# Patient Record
Sex: Female | Born: 1935
Health system: Southern US, Community
[De-identification: ages and names within clinical notes are randomized; demographics above are authoritative.]

## PROBLEM LIST (undated history)

## (undated) DIAGNOSIS — F419 Anxiety disorder, unspecified: Secondary | ICD-10-CM

## (undated) DIAGNOSIS — I480 Paroxysmal atrial fibrillation: Secondary | ICD-10-CM

## (undated) DIAGNOSIS — G629 Polyneuropathy, unspecified: Secondary | ICD-10-CM

## (undated) DIAGNOSIS — G4733 Obstructive sleep apnea (adult) (pediatric): Secondary | ICD-10-CM

## (undated) DIAGNOSIS — R7881 Bacteremia: Secondary | ICD-10-CM

## (undated) DIAGNOSIS — M719 Bursopathy, unspecified: Secondary | ICD-10-CM

## (undated) DIAGNOSIS — B9620 Unspecified Escherichia coli [E. coli] as the cause of diseases classified elsewhere: Secondary | ICD-10-CM

## (undated) DIAGNOSIS — F329 Major depressive disorder, single episode, unspecified: Secondary | ICD-10-CM

## (undated) DIAGNOSIS — K439 Ventral hernia without obstruction or gangrene: Secondary | ICD-10-CM

## (undated) DIAGNOSIS — R911 Solitary pulmonary nodule: Secondary | ICD-10-CM

## (undated) DIAGNOSIS — D509 Iron deficiency anemia, unspecified: Secondary | ICD-10-CM

## (undated) DIAGNOSIS — K219 Gastro-esophageal reflux disease without esophagitis: Secondary | ICD-10-CM

## (undated) DIAGNOSIS — E785 Hyperlipidemia, unspecified: Secondary | ICD-10-CM

## (undated) DIAGNOSIS — Z87440 Personal history of urinary (tract) infections: Secondary | ICD-10-CM

## (undated) DIAGNOSIS — E669 Obesity, unspecified: Secondary | ICD-10-CM

## (undated) DIAGNOSIS — E039 Hypothyroidism, unspecified: Secondary | ICD-10-CM

## (undated) DIAGNOSIS — I10 Essential (primary) hypertension: Secondary | ICD-10-CM

## (undated) DIAGNOSIS — E119 Type 2 diabetes mellitus without complications: Secondary | ICD-10-CM

## (undated) DIAGNOSIS — I5032 Chronic diastolic (congestive) heart failure: Secondary | ICD-10-CM

## (undated) DIAGNOSIS — I2510 Atherosclerotic heart disease of native coronary artery without angina pectoris: Secondary | ICD-10-CM

## (undated) DIAGNOSIS — H269 Unspecified cataract: Secondary | ICD-10-CM

## (undated) DIAGNOSIS — J449 Chronic obstructive pulmonary disease, unspecified: Secondary | ICD-10-CM

## (undated) DIAGNOSIS — T7840XA Allergy, unspecified, initial encounter: Secondary | ICD-10-CM

## (undated) DIAGNOSIS — M67919 Unspecified disorder of synovium and tendon, unspecified shoulder: Secondary | ICD-10-CM

## (undated) HISTORY — PX: CT ABD W & PELVIS WO/W CM: HXRAD299

## (undated) HISTORY — DX: Unspecified Escherichia coli (E. coli) as the cause of diseases classified elsewhere: B96.20

## (undated) HISTORY — DX: Essential (primary) hypertension: I10

## (undated) HISTORY — DX: Bursopathy, unspecified: M71.9

## (undated) HISTORY — DX: Solitary pulmonary nodule: R91.1

## (undated) HISTORY — DX: Bacteremia: R78.81

## (undated) HISTORY — DX: Personal history of urinary (tract) infections: Z87.440

## (undated) HISTORY — DX: Unspecified cataract: H26.9

## (undated) HISTORY — DX: Iron deficiency anemia, unspecified: D50.9

## (undated) HISTORY — DX: Obstructive sleep apnea (adult) (pediatric): G47.33

## (undated) HISTORY — DX: Polyneuropathy, unspecified: G62.9

## (undated) HISTORY — DX: Anxiety disorder, unspecified: F41.9

## (undated) HISTORY — DX: Hypothyroidism, unspecified: E03.9

## (undated) HISTORY — DX: Type 2 diabetes mellitus without complications: E11.9

## (undated) HISTORY — DX: Paroxysmal atrial fibrillation: I48.0

## (undated) HISTORY — DX: Allergy, unspecified, initial encounter: T78.40XA

## (undated) HISTORY — DX: Ventral hernia without obstruction or gangrene: K43.9

## (undated) HISTORY — PX: TONSILLECTOMY: SUR1361

## (undated) HISTORY — DX: Hyperlipidemia, unspecified: E78.5

## (undated) HISTORY — DX: Obesity, unspecified: E66.9

## (undated) HISTORY — DX: Unspecified disorder of synovium and tendon, unspecified shoulder: M67.919

## (undated) HISTORY — DX: Major depressive disorder, single episode, unspecified: F32.9

## (undated) HISTORY — DX: Chronic obstructive pulmonary disease, unspecified: J44.9

## (undated) HISTORY — PX: OTHER SURGICAL HISTORY: SHX169

## (undated) HISTORY — PX: GALLBLADDER SURGERY: SHX652

---

## 1968-03-08 DIAGNOSIS — E039 Hypothyroidism, unspecified: Secondary | ICD-10-CM

## 1968-03-08 HISTORY — DX: Hypothyroidism, unspecified: E03.9

## 1982-03-08 DIAGNOSIS — M719 Bursopathy, unspecified: Secondary | ICD-10-CM

## 1982-03-08 DIAGNOSIS — M67919 Unspecified disorder of synovium and tendon, unspecified shoulder: Secondary | ICD-10-CM | POA: Insufficient documentation

## 1982-03-08 HISTORY — PX: ROTATOR CUFF REPAIR: SHX139

## 1983-03-09 HISTORY — PX: TOTAL ABDOMINAL HYSTERECTOMY: SHX209

## 1992-03-08 DIAGNOSIS — I10 Essential (primary) hypertension: Secondary | ICD-10-CM

## 1992-03-08 HISTORY — DX: Essential (primary) hypertension: I10

## 1995-03-09 HISTORY — PX: CHOLECYSTECTOMY: SHX55

## 1997-03-08 DIAGNOSIS — F339 Major depressive disorder, recurrent, unspecified: Secondary | ICD-10-CM | POA: Insufficient documentation

## 1997-10-06 ENCOUNTER — Encounter: Payer: Self-pay | Admitting: Family Medicine

## 1997-10-06 LAB — CONVERTED CEMR LAB: Pap Smear: NORMAL

## 1997-12-05 HISTORY — PX: ESOPHAGOGASTRODUODENOSCOPY: SHX1529

## 1997-12-06 HISTORY — PX: OTHER SURGICAL HISTORY: SHX169

## 1997-12-06 HISTORY — PX: CARPAL TUNNEL RELEASE: SHX101

## 1998-03-08 DIAGNOSIS — J449 Chronic obstructive pulmonary disease, unspecified: Secondary | ICD-10-CM

## 1998-03-08 HISTORY — DX: Chronic obstructive pulmonary disease, unspecified: J44.9

## 1998-12-07 DIAGNOSIS — F32A Depression, unspecified: Secondary | ICD-10-CM

## 1998-12-07 DIAGNOSIS — F329 Major depressive disorder, single episode, unspecified: Secondary | ICD-10-CM

## 1998-12-07 HISTORY — DX: Depression, unspecified: F32.A

## 1998-12-07 HISTORY — DX: Major depressive disorder, single episode, unspecified: F32.9

## 1999-01-14 ENCOUNTER — Other Ambulatory Visit: Admission: RE | Admit: 1999-01-14 | Discharge: 1999-01-14 | Payer: Self-pay | Admitting: Obstetrics & Gynecology

## 1999-03-19 ENCOUNTER — Encounter: Payer: Self-pay | Admitting: Family Medicine

## 1999-03-19 ENCOUNTER — Encounter: Admission: RE | Admit: 1999-03-19 | Discharge: 1999-03-19 | Payer: Self-pay | Admitting: Family Medicine

## 1999-03-19 HISTORY — PX: OTHER SURGICAL HISTORY: SHX169

## 1999-03-24 ENCOUNTER — Encounter: Payer: Self-pay | Admitting: Family Medicine

## 1999-03-24 ENCOUNTER — Encounter: Admission: RE | Admit: 1999-03-24 | Discharge: 1999-03-24 | Payer: Self-pay | Admitting: Family Medicine

## 2000-01-07 ENCOUNTER — Encounter: Payer: Self-pay | Admitting: Family Medicine

## 2000-01-07 LAB — CONVERTED CEMR LAB: Pap Smear: NORMAL

## 2000-02-10 ENCOUNTER — Other Ambulatory Visit: Admission: RE | Admit: 2000-02-10 | Discharge: 2000-02-10 | Payer: Self-pay | Admitting: Obstetrics and Gynecology

## 2000-07-20 ENCOUNTER — Encounter: Payer: Self-pay | Admitting: Orthopedic Surgery

## 2000-07-27 ENCOUNTER — Ambulatory Visit (HOSPITAL_COMMUNITY): Admission: RE | Admit: 2000-07-27 | Discharge: 2000-07-27 | Payer: Self-pay | Admitting: Orthopedic Surgery

## 2000-07-27 ENCOUNTER — Encounter (INDEPENDENT_AMBULATORY_CARE_PROVIDER_SITE_OTHER): Payer: Self-pay | Admitting: Specialist

## 2000-08-24 ENCOUNTER — Encounter: Payer: Self-pay | Admitting: Cardiovascular Disease

## 2000-08-24 HISTORY — PX: OTHER SURGICAL HISTORY: SHX169

## 2000-10-04 DIAGNOSIS — E785 Hyperlipidemia, unspecified: Secondary | ICD-10-CM | POA: Insufficient documentation

## 2000-10-04 HISTORY — DX: Hyperlipidemia, unspecified: E78.5

## 2001-05-18 ENCOUNTER — Other Ambulatory Visit: Admission: RE | Admit: 2001-05-18 | Discharge: 2001-05-18 | Payer: Self-pay | Admitting: Obstetrics and Gynecology

## 2001-10-06 DIAGNOSIS — R209 Unspecified disturbances of skin sensation: Secondary | ICD-10-CM | POA: Insufficient documentation

## 2002-05-07 ENCOUNTER — Encounter: Payer: Self-pay | Admitting: Family Medicine

## 2002-05-07 LAB — CONVERTED CEMR LAB: Pap Smear: NORMAL

## 2002-05-22 ENCOUNTER — Other Ambulatory Visit: Admission: RE | Admit: 2002-05-22 | Discharge: 2002-05-22 | Payer: Self-pay | Admitting: Obstetrics and Gynecology

## 2002-06-14 ENCOUNTER — Emergency Department (HOSPITAL_COMMUNITY): Admission: AD | Admit: 2002-06-14 | Discharge: 2002-06-14 | Payer: Self-pay | Admitting: Emergency Medicine

## 2003-07-03 HISTORY — PX: OTHER SURGICAL HISTORY: SHX169

## 2003-09-20 ENCOUNTER — Encounter: Admission: RE | Admit: 2003-09-20 | Discharge: 2003-09-20 | Payer: Self-pay | Admitting: Orthopedic Surgery

## 2003-09-26 ENCOUNTER — Ambulatory Visit (HOSPITAL_BASED_OUTPATIENT_CLINIC_OR_DEPARTMENT_OTHER): Admission: RE | Admit: 2003-09-26 | Discharge: 2003-09-26 | Payer: Self-pay | Admitting: Orthopedic Surgery

## 2003-09-26 ENCOUNTER — Ambulatory Visit (HOSPITAL_COMMUNITY): Admission: RE | Admit: 2003-09-26 | Discharge: 2003-09-26 | Payer: Self-pay | Admitting: Orthopedic Surgery

## 2004-01-07 ENCOUNTER — Ambulatory Visit: Payer: Self-pay | Admitting: Family Medicine

## 2004-03-03 ENCOUNTER — Ambulatory Visit: Payer: Self-pay | Admitting: Family Medicine

## 2004-06-09 ENCOUNTER — Ambulatory Visit: Payer: Self-pay | Admitting: Family Medicine

## 2004-06-11 ENCOUNTER — Ambulatory Visit: Payer: Self-pay | Admitting: Family Medicine

## 2004-08-05 ENCOUNTER — Ambulatory Visit: Payer: Self-pay | Admitting: Internal Medicine

## 2004-12-03 ENCOUNTER — Ambulatory Visit: Payer: Self-pay | Admitting: Family Medicine

## 2004-12-28 ENCOUNTER — Encounter: Admission: RE | Admit: 2004-12-28 | Discharge: 2004-12-28 | Payer: Self-pay | Admitting: Surgery

## 2005-02-11 ENCOUNTER — Ambulatory Visit: Payer: Self-pay | Admitting: Family Medicine

## 2005-12-03 ENCOUNTER — Ambulatory Visit: Payer: Self-pay | Admitting: Family Medicine

## 2005-12-07 ENCOUNTER — Ambulatory Visit: Payer: Self-pay | Admitting: Family Medicine

## 2005-12-22 ENCOUNTER — Ambulatory Visit: Payer: Self-pay | Admitting: Family Medicine

## 2006-01-08 ENCOUNTER — Emergency Department (HOSPITAL_COMMUNITY): Admission: EM | Admit: 2006-01-08 | Discharge: 2006-01-08 | Payer: Self-pay | Admitting: Family Medicine

## 2006-01-19 ENCOUNTER — Ambulatory Visit: Payer: Self-pay | Admitting: Family Medicine

## 2006-02-05 DIAGNOSIS — E119 Type 2 diabetes mellitus without complications: Secondary | ICD-10-CM

## 2006-02-05 HISTORY — DX: Type 2 diabetes mellitus without complications: E11.9

## 2006-02-15 ENCOUNTER — Ambulatory Visit: Payer: Self-pay | Admitting: Family Medicine

## 2006-03-16 ENCOUNTER — Ambulatory Visit: Payer: Self-pay | Admitting: Family Medicine

## 2006-05-25 ENCOUNTER — Ambulatory Visit: Payer: Self-pay | Admitting: Family Medicine

## 2006-06-05 ENCOUNTER — Encounter: Payer: Self-pay | Admitting: Family Medicine

## 2006-06-14 ENCOUNTER — Ambulatory Visit: Payer: Self-pay | Admitting: Family Medicine

## 2006-06-14 LAB — CONVERTED CEMR LAB: Hgb A1c MFr Bld: 5.8 % (ref 4.6–6.0)

## 2006-06-16 ENCOUNTER — Ambulatory Visit: Payer: Self-pay | Admitting: Family Medicine

## 2006-07-12 ENCOUNTER — Ambulatory Visit: Payer: Self-pay | Admitting: Family Medicine

## 2006-07-25 DIAGNOSIS — T7840XA Allergy, unspecified, initial encounter: Secondary | ICD-10-CM | POA: Insufficient documentation

## 2006-08-25 ENCOUNTER — Telehealth: Payer: Self-pay | Admitting: Family Medicine

## 2006-10-05 ENCOUNTER — Emergency Department (HOSPITAL_COMMUNITY): Admission: EM | Admit: 2006-10-05 | Discharge: 2006-10-05 | Payer: Self-pay | Admitting: Family Medicine

## 2007-01-04 ENCOUNTER — Encounter: Admission: RE | Admit: 2007-01-04 | Discharge: 2007-01-04 | Payer: Self-pay | Admitting: Orthopedic Surgery

## 2007-01-12 ENCOUNTER — Ambulatory Visit: Payer: Self-pay | Admitting: Family Medicine

## 2007-01-12 LAB — CONVERTED CEMR LAB
ALT: 18 units/L (ref 0–35)
AST: 14 units/L (ref 0–37)
BUN: 22 mg/dL (ref 6–23)
Basophils Absolute: 0.1 10*3/uL (ref 0.0–0.1)
Basophils Relative: 0.9 % (ref 0.0–1.0)
CO2: 31 meq/L (ref 19–32)
Calcium: 9.3 mg/dL (ref 8.4–10.5)
Chloride: 105 meq/L (ref 96–112)
Cholesterol: 234 mg/dL (ref 0–200)
Creatinine, Ser: 1 mg/dL (ref 0.4–1.2)
Creatinine,U: 176.4 mg/dL
Direct LDL: 174.7 mg/dL
Eosinophils Absolute: 0.2 10*3/uL (ref 0.0–0.6)
Eosinophils Relative: 2.2 % (ref 0.0–5.0)
Free T4: 0.8 ng/dL (ref 0.6–1.6)
GFR calc Af Amer: 71 mL/min
GFR calc non Af Amer: 58 mL/min
Glucose, Bld: 113 mg/dL — ABNORMAL HIGH (ref 70–99)
HCT: 40.6 % (ref 36.0–46.0)
HDL: 50.3 mg/dL (ref >39.0)
Hemoglobin: 14.2 g/dL (ref 12.0–15.0)
Hgb A1c MFr Bld: 6 % (ref 4.6–6.0)
Lymphocytes Relative: 29.2 % (ref 12.0–46.0)
MCHC: 35 g/dL (ref 30.0–36.0)
MCV: 88.5 fL (ref 78.0–100.0)
Microalb Creat Ratio: 10.2 mg/g (ref 0.0–30.0)
Microalb, Ur: 1.8 mg/dL (ref 0.0–1.9)
Monocytes Absolute: 0.7 10*3/uL (ref 0.2–0.7)
Monocytes Relative: 9.3 % (ref 3.0–11.0)
Neutro Abs: 4.1 10*3/uL (ref 1.4–7.7)
Neutrophils Relative %: 58.4 % (ref 43.0–77.0)
Platelets: 308 10*3/uL (ref 150–400)
Potassium: 4.7 meq/L (ref 3.5–5.1)
RBC: 4.59 M/uL (ref 3.87–5.11)
RDW: 13.5 % (ref 11.5–14.6)
Sodium: 143 meq/L (ref 135–145)
TSH: 5.56 microintl units/mL — ABNORMAL HIGH (ref 0.35–5.50)
Total CHOL/HDL Ratio: 4.7
Triglycerides: 126 mg/dL (ref 0–149)
VLDL: 25 mg/dL (ref 0–40)
WBC: 7.2 10*3/uL (ref 4.5–10.5)

## 2007-01-18 ENCOUNTER — Ambulatory Visit: Payer: Self-pay | Admitting: Family Medicine

## 2007-01-23 ENCOUNTER — Ambulatory Visit (HOSPITAL_BASED_OUTPATIENT_CLINIC_OR_DEPARTMENT_OTHER): Admission: RE | Admit: 2007-01-23 | Discharge: 2007-01-23 | Payer: Self-pay | Admitting: Orthopedic Surgery

## 2007-04-19 ENCOUNTER — Telehealth: Payer: Self-pay | Admitting: Family Medicine

## 2007-05-11 ENCOUNTER — Ambulatory Visit: Payer: Self-pay | Admitting: Family Medicine

## 2007-05-18 ENCOUNTER — Telehealth (INDEPENDENT_AMBULATORY_CARE_PROVIDER_SITE_OTHER): Payer: Self-pay | Admitting: *Deleted

## 2007-05-19 ENCOUNTER — Telehealth (INDEPENDENT_AMBULATORY_CARE_PROVIDER_SITE_OTHER): Payer: Self-pay | Admitting: *Deleted

## 2007-05-23 ENCOUNTER — Ambulatory Visit: Payer: Self-pay | Admitting: Family Medicine

## 2007-05-23 DIAGNOSIS — J439 Emphysema, unspecified: Secondary | ICD-10-CM | POA: Insufficient documentation

## 2007-05-23 DIAGNOSIS — J449 Chronic obstructive pulmonary disease, unspecified: Secondary | ICD-10-CM | POA: Insufficient documentation

## 2007-05-26 ENCOUNTER — Ambulatory Visit: Payer: Self-pay | Admitting: Cardiology

## 2007-06-02 ENCOUNTER — Ambulatory Visit: Payer: Self-pay

## 2007-06-02 ENCOUNTER — Encounter: Payer: Self-pay | Admitting: Family Medicine

## 2007-06-02 HISTORY — PX: OTHER SURGICAL HISTORY: SHX169

## 2007-06-02 HISTORY — PX: US ECHOCARDIOGRAPHY: HXRAD669

## 2007-07-03 ENCOUNTER — Ambulatory Visit: Payer: Self-pay | Admitting: Family Medicine

## 2007-07-03 DIAGNOSIS — R609 Edema, unspecified: Secondary | ICD-10-CM | POA: Insufficient documentation

## 2007-08-09 ENCOUNTER — Telehealth: Payer: Self-pay | Admitting: Family Medicine

## 2007-10-11 ENCOUNTER — Ambulatory Visit: Payer: Self-pay | Admitting: Family Medicine

## 2007-10-11 DIAGNOSIS — K439 Ventral hernia without obstruction or gangrene: Secondary | ICD-10-CM | POA: Insufficient documentation

## 2007-10-11 DIAGNOSIS — K209 Esophagitis, unspecified without bleeding: Secondary | ICD-10-CM | POA: Insufficient documentation

## 2007-10-12 ENCOUNTER — Encounter: Payer: Self-pay | Admitting: Family Medicine

## 2007-10-12 LAB — CONVERTED CEMR LAB: TSH: 2.9 microintl units/mL (ref 0.35–5.50)

## 2007-10-13 ENCOUNTER — Encounter: Admission: RE | Admit: 2007-10-13 | Discharge: 2007-10-13 | Payer: Self-pay | Admitting: Family Medicine

## 2007-10-16 ENCOUNTER — Telehealth (INDEPENDENT_AMBULATORY_CARE_PROVIDER_SITE_OTHER): Payer: Self-pay | Admitting: *Deleted

## 2007-11-08 ENCOUNTER — Ambulatory Visit: Payer: Self-pay | Admitting: Gastroenterology

## 2007-11-08 DIAGNOSIS — R1319 Other dysphagia: Secondary | ICD-10-CM | POA: Insufficient documentation

## 2007-11-08 DIAGNOSIS — T17308A Unspecified foreign body in larynx causing other injury, initial encounter: Secondary | ICD-10-CM | POA: Insufficient documentation

## 2007-11-10 ENCOUNTER — Ambulatory Visit (HOSPITAL_COMMUNITY): Admission: RE | Admit: 2007-11-10 | Discharge: 2007-11-10 | Payer: Self-pay | Admitting: Gastroenterology

## 2007-11-14 ENCOUNTER — Telehealth (INDEPENDENT_AMBULATORY_CARE_PROVIDER_SITE_OTHER): Payer: Self-pay | Admitting: *Deleted

## 2007-11-16 ENCOUNTER — Ambulatory Visit (HOSPITAL_COMMUNITY): Admission: RE | Admit: 2007-11-16 | Discharge: 2007-11-16 | Payer: Self-pay | Admitting: Gastroenterology

## 2007-11-22 ENCOUNTER — Encounter: Payer: Self-pay | Admitting: Gastroenterology

## 2007-11-22 DIAGNOSIS — K222 Esophageal obstruction: Secondary | ICD-10-CM | POA: Insufficient documentation

## 2007-11-28 ENCOUNTER — Ambulatory Visit: Payer: Self-pay | Admitting: Gastroenterology

## 2007-11-29 ENCOUNTER — Encounter: Payer: Self-pay | Admitting: Gastroenterology

## 2007-11-29 LAB — CONVERTED CEMR LAB
BUN: 18 mg/dL (ref 6–23)
CO2: 32 meq/L (ref 19–32)
Calcium: 8.9 mg/dL (ref 8.4–10.5)
Chloride: 105 meq/L (ref 96–112)
Creatinine, Ser: 1.2 mg/dL (ref 0.4–1.2)
GFR calc Af Amer: 57 mL/min
GFR calc non Af Amer: 47 mL/min
Glucose, Bld: 119 mg/dL — ABNORMAL HIGH (ref 70–99)
Potassium: 4.6 meq/L (ref 3.5–5.1)
Sodium: 141 meq/L (ref 135–145)

## 2007-11-30 ENCOUNTER — Ambulatory Visit: Payer: Self-pay | Admitting: Cardiology

## 2007-12-06 ENCOUNTER — Ambulatory Visit: Payer: Self-pay | Admitting: Gastroenterology

## 2007-12-14 ENCOUNTER — Ambulatory Visit: Payer: Self-pay | Admitting: Gastroenterology

## 2007-12-14 ENCOUNTER — Ambulatory Visit (HOSPITAL_COMMUNITY): Admission: RE | Admit: 2007-12-14 | Discharge: 2007-12-14 | Payer: Self-pay | Admitting: Gastroenterology

## 2007-12-15 ENCOUNTER — Ambulatory Visit: Payer: Self-pay | Admitting: Family Medicine

## 2008-01-04 ENCOUNTER — Ambulatory Visit: Payer: Self-pay | Admitting: Family Medicine

## 2008-01-08 ENCOUNTER — Ambulatory Visit: Payer: Self-pay | Admitting: Gastroenterology

## 2008-01-08 DIAGNOSIS — R498 Other voice and resonance disorders: Secondary | ICD-10-CM | POA: Insufficient documentation

## 2008-01-11 ENCOUNTER — Telehealth: Payer: Self-pay | Admitting: Family Medicine

## 2008-01-15 ENCOUNTER — Telehealth: Payer: Self-pay | Admitting: Family Medicine

## 2008-01-18 ENCOUNTER — Ambulatory Visit: Payer: Self-pay | Admitting: Family Medicine

## 2008-01-23 ENCOUNTER — Encounter: Payer: Self-pay | Admitting: Gastroenterology

## 2008-03-20 ENCOUNTER — Telehealth: Payer: Self-pay | Admitting: Family Medicine

## 2008-03-21 ENCOUNTER — Telehealth: Payer: Self-pay | Admitting: Family Medicine

## 2008-04-17 ENCOUNTER — Encounter: Payer: Self-pay | Admitting: Family Medicine

## 2008-04-24 ENCOUNTER — Ambulatory Visit: Payer: Self-pay | Admitting: Family Medicine

## 2008-04-30 ENCOUNTER — Encounter: Payer: Self-pay | Admitting: Family Medicine

## 2008-05-02 ENCOUNTER — Encounter: Payer: Self-pay | Admitting: Family Medicine

## 2008-05-09 ENCOUNTER — Telehealth: Payer: Self-pay | Admitting: Family Medicine

## 2008-05-30 ENCOUNTER — Ambulatory Visit: Payer: Self-pay | Admitting: Family Medicine

## 2008-06-04 ENCOUNTER — Telehealth: Payer: Self-pay | Admitting: Family Medicine

## 2008-06-12 ENCOUNTER — Encounter: Payer: Self-pay | Admitting: Family Medicine

## 2008-07-19 ENCOUNTER — Encounter: Payer: Self-pay | Admitting: Family Medicine

## 2008-08-29 ENCOUNTER — Ambulatory Visit: Payer: Self-pay | Admitting: Family Medicine

## 2008-08-29 DIAGNOSIS — R002 Palpitations: Secondary | ICD-10-CM | POA: Insufficient documentation

## 2008-09-02 ENCOUNTER — Telehealth: Payer: Self-pay | Admitting: Family Medicine

## 2008-10-17 ENCOUNTER — Emergency Department (HOSPITAL_COMMUNITY): Admission: EM | Admit: 2008-10-17 | Discharge: 2008-10-17 | Payer: Self-pay | Admitting: Family Medicine

## 2008-10-23 ENCOUNTER — Encounter: Payer: Self-pay | Admitting: Family Medicine

## 2008-10-24 ENCOUNTER — Ambulatory Visit: Payer: Self-pay | Admitting: Family Medicine

## 2008-10-31 ENCOUNTER — Encounter (INDEPENDENT_AMBULATORY_CARE_PROVIDER_SITE_OTHER): Payer: Self-pay | Admitting: *Deleted

## 2008-11-12 ENCOUNTER — Telehealth: Payer: Self-pay | Admitting: Family Medicine

## 2008-11-13 ENCOUNTER — Encounter: Payer: Self-pay | Admitting: Family Medicine

## 2008-12-12 ENCOUNTER — Encounter: Payer: Self-pay | Admitting: Family Medicine

## 2009-01-03 ENCOUNTER — Ambulatory Visit: Payer: Self-pay | Admitting: Family Medicine

## 2009-01-08 ENCOUNTER — Ambulatory Visit: Payer: Self-pay | Admitting: Family Medicine

## 2009-01-09 ENCOUNTER — Telehealth: Payer: Self-pay | Admitting: Family Medicine

## 2009-01-24 ENCOUNTER — Inpatient Hospital Stay (HOSPITAL_COMMUNITY): Admission: RE | Admit: 2009-01-24 | Discharge: 2009-02-12 | Payer: Self-pay | Admitting: General Surgery

## 2009-01-24 HISTORY — PX: HERNIA REPAIR: SHX51

## 2009-02-20 ENCOUNTER — Encounter: Payer: Self-pay | Admitting: Internal Medicine

## 2009-03-17 ENCOUNTER — Telehealth: Payer: Self-pay | Admitting: Family Medicine

## 2009-03-27 ENCOUNTER — Encounter: Payer: Self-pay | Admitting: Family Medicine

## 2009-04-22 ENCOUNTER — Telehealth: Payer: Self-pay | Admitting: Family Medicine

## 2009-05-22 ENCOUNTER — Telehealth: Payer: Self-pay | Admitting: Family Medicine

## 2009-06-30 ENCOUNTER — Ambulatory Visit: Payer: Self-pay | Admitting: Family Medicine

## 2009-06-30 DIAGNOSIS — M79609 Pain in unspecified limb: Secondary | ICD-10-CM | POA: Insufficient documentation

## 2009-06-30 DIAGNOSIS — B351 Tinea unguium: Secondary | ICD-10-CM | POA: Insufficient documentation

## 2009-08-25 ENCOUNTER — Encounter: Payer: Self-pay | Admitting: Family Medicine

## 2009-09-11 ENCOUNTER — Ambulatory Visit: Payer: Self-pay | Admitting: Family Medicine

## 2009-09-16 ENCOUNTER — Ambulatory Visit: Payer: Self-pay | Admitting: Internal Medicine

## 2009-09-17 ENCOUNTER — Telehealth: Payer: Self-pay | Admitting: Internal Medicine

## 2009-10-09 ENCOUNTER — Encounter (INDEPENDENT_AMBULATORY_CARE_PROVIDER_SITE_OTHER): Payer: Self-pay | Admitting: *Deleted

## 2009-11-03 ENCOUNTER — Telehealth: Payer: Self-pay | Admitting: Family Medicine

## 2009-11-05 ENCOUNTER — Telehealth: Payer: Self-pay | Admitting: Family Medicine

## 2009-11-20 ENCOUNTER — Ambulatory Visit: Payer: Self-pay | Admitting: Internal Medicine

## 2009-11-25 ENCOUNTER — Encounter: Payer: Self-pay | Admitting: Family Medicine

## 2009-12-05 ENCOUNTER — Encounter: Payer: Self-pay | Admitting: Family Medicine

## 2009-12-24 ENCOUNTER — Telehealth: Payer: Self-pay | Admitting: Family Medicine

## 2010-01-13 ENCOUNTER — Encounter: Payer: Self-pay | Admitting: Cardiovascular Disease

## 2010-01-13 ENCOUNTER — Encounter: Payer: Self-pay | Admitting: Internal Medicine

## 2010-01-13 ENCOUNTER — Ambulatory Visit: Payer: Self-pay | Admitting: Internal Medicine

## 2010-01-13 ENCOUNTER — Telehealth (INDEPENDENT_AMBULATORY_CARE_PROVIDER_SITE_OTHER): Payer: Self-pay | Admitting: *Deleted

## 2010-01-13 DIAGNOSIS — R079 Chest pain, unspecified: Secondary | ICD-10-CM | POA: Insufficient documentation

## 2010-01-14 LAB — CONVERTED CEMR LAB
Basophils Absolute: 0 10*3/uL (ref 0.0–0.1)
Basophils Relative: 0.2 % (ref 0.0–3.0)
CK-MB: 2.2 ng/mL (ref 0.3–4.0)
Eosinophils Absolute: 0.1 10*3/uL (ref 0.0–0.7)
Eosinophils Relative: 2.1 % (ref 0.0–5.0)
HCT: 37.2 % (ref 36.0–46.0)
Hemoglobin: 12.8 g/dL (ref 12.0–15.0)
Lymphocytes Relative: 33.7 % (ref 12.0–46.0)
Lymphs Abs: 2 10*3/uL (ref 0.7–4.0)
MCHC: 34.3 g/dL (ref 30.0–36.0)
MCV: 88.2 fL (ref 78.0–100.0)
Monocytes Absolute: 0.6 10*3/uL (ref 0.1–1.0)
Monocytes Relative: 9.7 % (ref 3.0–12.0)
Neutro Abs: 3.2 10*3/uL (ref 1.4–7.7)
Neutrophils Relative %: 54.3 % (ref 43.0–77.0)
Platelets: 231 10*3/uL (ref 150.0–400.0)
Pro B Natriuretic peptide (BNP): 36.2 pg/mL (ref 0.0–100.0)
RBC: 4.21 M/uL (ref 3.87–5.11)
RDW: 14.3 % (ref 11.5–14.6)
Relative Index: 2 (ref 0.0–2.5)
Total CK: 109 units/L (ref 7–177)
WBC: 5.9 10*3/uL (ref 4.5–10.5)

## 2010-01-22 ENCOUNTER — Telehealth: Payer: Self-pay | Admitting: Cardiovascular Disease

## 2010-01-27 ENCOUNTER — Ambulatory Visit (HOSPITAL_BASED_OUTPATIENT_CLINIC_OR_DEPARTMENT_OTHER)
Admission: RE | Admit: 2010-01-27 | Discharge: 2010-01-27 | Payer: Self-pay | Source: Home / Self Care | Admitting: Internal Medicine

## 2010-01-27 ENCOUNTER — Encounter: Payer: Self-pay | Admitting: Internal Medicine

## 2010-02-04 ENCOUNTER — Ambulatory Visit: Payer: Self-pay | Admitting: Pulmonary Disease

## 2010-02-04 ENCOUNTER — Ambulatory Visit: Payer: Self-pay | Admitting: Cardiovascular Disease

## 2010-02-04 DIAGNOSIS — R011 Cardiac murmur, unspecified: Secondary | ICD-10-CM | POA: Insufficient documentation

## 2010-02-05 ENCOUNTER — Ambulatory Visit: Payer: Self-pay

## 2010-02-05 ENCOUNTER — Encounter: Payer: Self-pay | Admitting: Cardiovascular Disease

## 2010-02-05 ENCOUNTER — Telehealth: Payer: Self-pay | Admitting: Internal Medicine

## 2010-02-09 ENCOUNTER — Encounter: Payer: Self-pay | Admitting: Cardiovascular Disease

## 2010-02-09 ENCOUNTER — Ambulatory Visit: Payer: Self-pay | Admitting: Pulmonary Disease

## 2010-02-09 DIAGNOSIS — G4733 Obstructive sleep apnea (adult) (pediatric): Secondary | ICD-10-CM | POA: Insufficient documentation

## 2010-02-13 ENCOUNTER — Encounter: Payer: Self-pay | Admitting: Family Medicine

## 2010-02-17 ENCOUNTER — Encounter: Payer: Self-pay | Admitting: Pulmonary Disease

## 2010-02-17 ENCOUNTER — Telehealth (INDEPENDENT_AMBULATORY_CARE_PROVIDER_SITE_OTHER): Payer: Self-pay | Admitting: *Deleted

## 2010-02-18 ENCOUNTER — Encounter: Payer: Self-pay | Admitting: Cardiology

## 2010-02-18 ENCOUNTER — Encounter: Payer: Self-pay | Admitting: *Deleted

## 2010-02-18 ENCOUNTER — Encounter (HOSPITAL_COMMUNITY)
Admission: RE | Admit: 2010-02-18 | Discharge: 2010-04-07 | Payer: Self-pay | Source: Home / Self Care | Attending: Cardiovascular Disease | Admitting: Cardiovascular Disease

## 2010-02-19 ENCOUNTER — Encounter: Payer: Self-pay | Admitting: Family Medicine

## 2010-02-19 ENCOUNTER — Ambulatory Visit: Payer: Self-pay

## 2010-02-19 LAB — CONVERTED CEMR LAB
BUN: 19 mg/dL
Cholesterol: 126 mg/dL
Creatinine, Ser: 1 mg/dL
Direct LDL: 45 mg/dL
Free T4: 1.1 ng/dL
Glucose, Bld: 121 mg/dL
HDL: 66 mg/dL
Hgb A1c MFr Bld: 6.3 %
LDL Cholesterol: 45 mg/dL
TSH: 0.34 microintl units/mL
Triglycerides: 77 mg/dL

## 2010-02-24 ENCOUNTER — Encounter: Payer: Self-pay | Admitting: Family Medicine

## 2010-02-26 ENCOUNTER — Encounter: Payer: Self-pay | Admitting: Family Medicine

## 2010-02-26 ENCOUNTER — Encounter (INDEPENDENT_AMBULATORY_CARE_PROVIDER_SITE_OTHER): Payer: Self-pay | Admitting: *Deleted

## 2010-03-10 ENCOUNTER — Ambulatory Visit
Admission: RE | Admit: 2010-03-10 | Discharge: 2010-03-10 | Payer: Self-pay | Source: Home / Self Care | Attending: Cardiovascular Disease | Admitting: Cardiovascular Disease

## 2010-03-25 ENCOUNTER — Ambulatory Visit
Admission: RE | Admit: 2010-03-25 | Discharge: 2010-03-25 | Payer: Self-pay | Source: Home / Self Care | Attending: Pulmonary Disease | Admitting: Pulmonary Disease

## 2010-03-29 ENCOUNTER — Encounter: Payer: Self-pay | Admitting: Orthopedic Surgery

## 2010-04-05 LAB — CONVERTED CEMR LAB
Cholesterol: 153 mg/dL
Creatinine, Ser: 1.2 mg/dL
HDL: 50 mg/dL
LDL Cholesterol: 71 mg/dL
Triglycerides: 161 mg/dL
Vit D, 25-Hydroxy: 41.2 ng/mL

## 2010-04-09 ENCOUNTER — Telehealth: Payer: Self-pay | Admitting: Family Medicine

## 2010-04-09 NOTE — Assessment & Plan Note (Signed)
Summary: FOLLOW UP/EVE   Vital Signs:  Patient Profile:   75 Years Old Female Weight:      220 pounds Temp:     98.5 degrees F oral Pulse rate:   76 / minute Pulse rhythm:   regular BP sitting:   130 / 70  (right arm) Cuff size:   large  Vitals Entered By: Providence Crosby (January 18, 2007 9:30 AM)                 Chief Complaint:  6 month f/u// c/o uri symptoms suppose to have knee surgery monday.  History of Present Illness: Larey Seat one month ago, but was on way out of town and wasn't seen for a month...to have arthroscopy to repair two ligaments repaired. Is congested, chest feels tight and warm. Not snoking anymore, stopped 12/07 with 20cigs since total. Is gaining weight.  Current Allergies (reviewed today): ! CODEINE ! SULFA      Physical Exam  General:     Well-developed,well-nourished,in no acute distress; alert,appropriate and cooperative throughout examination, hoarse and congested. Head:     Normocephalic and atraumatic without obvious abnormalities. No apparent alopecia or balding. Eyes:     Conjunctiva clear bilaterally.  Ears:     External ear exam shows no significant lesions or deformities.  Otoscopic examination reveals clear canals, tympanic membranes are intact bilaterally without bulging, retraction, inflammation or discharge. Hearing is grossly normal bilaterally. Nose:     Mild congestion of nasal mucosa bilat. Mouth:     Oral mucosa and oropharynx without lesions.  Mild tan PND.Teeth in good repair. Neck:     No deformities, masses, or tenderness noted. Chest Wall:     No deformities, masses, or tenderness noted. Lungs:     Normal respiratory effort, chest expands symmetrically. Lungs are clear to auscultation, no crackles or wheezes except ronchi in left base which clears with cough. Heart:     Normal rate and regular rhythm. S1 and S2 normal without gallop, murmur, click, rub or other extra sounds.    Impression & Recommendations:   Problem # 1:  UPPER RESPIRATORY INFECTION, ACUTE (ICD-465.9) Assessment: New See instructions, Zithro-Guaif-fluids etc.   Problem # 2:  BRONCHITIS, OBSTRUCTIVE CHRONIC W/EXACRB (ICD-491.21) Assessment: Unchanged See instructions.  Complete Medication List: 1)  Prozac 10 Mg Caps (Fluoxetine hcl) 2)  Wellbutrin Sr 150 Mg Tb12 (Bupropion hcl) 3)  Norvasc 5 Mg Tabs (Amlodipine besylate) 4)  Spiriva Handihaler 18 Mcg Caps (Tiotropium bromide monohydrate) .... Inhale 1 capsule by mouth once a day 5)  Nexium 40 Mg Cpdr (Esomeprazole magnesium) 6)  Synthroid 137 Mcg Tabs (Levothyroxine sodium) 7)  Advair Diskus 250-50 Mcg/dose Misc (Fluticasone-salmeterol) .... Inhale 1 puff twice a day 8)  Albuterol 90 Mcg/act Aers (Albuterol) .... Inhale 1 puff every four to six hours 9)  Alprazolam 0.25 Mg Tabs (Alprazolam) .Marland Kitchen.. 1 q6 hrs prn 10)  Zithromax Tri-pak 500 Mg Tabs (Azithromycin) .... As dir 11)  Allegra 60mg  Tabs (fexofenadine Hcl)  .... One tab by mouth once daily as needed   Patient Instructions: 1)  Take: 2)  GUAIFENESIN  600mg  by mouth AM and NOON   3)    ROBITUSSIN PLAIN (NO LETTERS, NO NAMES) two tablespoons  or 4)    [facility] MUCOUS RELIEF EXPECTORANT (400 mg) 11/2 TABS  or 5)    GUAIFENESIN (200 MG) 3 TABS  6)  Take Zithromax 7)  RTC 6 mos COMP EXAM, Labs prior    Prescriptions: ZITHROMAX TRI-PAK 500 MG TABS (AZITHROMYCIN) as dir  #1 pak x 0   Entered and Authorized by:   Shaune Leeks MD   Signed by:   Shaune Leeks MD on 01/18/2007   Method used:   Print then Give to Patient   RxID:   [id number]  ]

## 2010-04-09 NOTE — Progress Notes (Signed)
Summary: sore mouth   Phone Note Call from Patient Call back at [phone]   Caller: Patient Call For: dr schaller Summary of Call: mouth is sore all around inside, x 1 week, has been taking clarithyromycine x 9 days, she asks if med could be causing this, has 3 more days left to take abx Initial call taken by: Lowella Petties,  May 18, 2007 9:32 AM  Follow-up for Phone Call        yes it could If respiratory infection is better, please try stopping the biaxin Call tomorrow if no better, could try some [facility] mouthwash then Follow-up by: Cindee Salt MD,  May 18, 2007 10:34 AM  Additional Follow-up for Phone Call Additional follow up Details #1::        patient notified to stop abx. and call tomorrow if not any better Additional Follow-up by: Providence Crosby,  May 18, 2007 12:14 PM

## 2010-04-09 NOTE — Procedures (Signed)
Summary: Instructions for Procedure/[facility] (Outpt)  Instructions for Procedure/[facility] (Outpt)   Imported By: Esmeralda Links D'jimraou 12/07/2007 13:23:44  _____________________________________________________________________  External Attachment:    Type:   Image     Comment:   External Document

## 2010-04-09 NOTE — Letter (Signed)
Summary: [facility]  [facility]   Imported By: Lanelle Bal 09/04/2009 10:27:24  _____________________________________________________________________  External Attachment:    Type:   Image     Comment:   External Document

## 2010-04-09 NOTE — Letter (Signed)
Summary: CMN for CPAP Supplies/[facility]  CMN for CPAP Supplies/[facility]   Imported By: Sherian Rein 02/20/2010 14:12:01  _____________________________________________________________________  External Attachment:    Type:   Image     Comment:   External Document

## 2010-04-09 NOTE — Letter (Signed)
Summary: [facility] / F/U VISIT / DR. MICHAEL AL  [facility] / F/U VISIT / DR. MICHAEL ALTHEIMER   Imported By: Carin Primrose 11/01/2008 10:05:24  _____________________________________________________________________  External Attachment:    Type:   Image     Comment:   External Document

## 2010-04-09 NOTE — Assessment & Plan Note (Signed)
Summary: CHECK THYROID/CLE   Vital Signs:  Patient Profile:   75 Years Old Female Weight:      230 pounds Temp:     98.5 degrees F oral Pulse rate:   82 / minute BP sitting:   155 / 94  (right arm) Cuff size:   regular  Vitals Entered By: Cooper Render (October 11, 2007 9:51 AM)                 Chief Complaint:  CK THYROID, FEET NUMB ? DIABETIC, and ABD HERNIA.  History of Present Illness: Here for numbness of both feet--getting worse, creaping up her leg. --has been told has a neuropathy. Has seen Dr Simonne Come, had surgery on foot which did not help.  Has  possibly seen neurology in the past.  Is stumbling and falls  Having problens swallowing frequently, feels like esophagus small--gets choked.  Has seen GI 3-29yrs ago,  post EGD was started on Nexium.  Thinks had a dilation.  Is on Nexium at the present.  Has abd hernia--small on U/S--has not seen surgeon.  Thinks has gotten larger.  Wants thyroid checked-- 11/08--TSH 5.56--tired, wants to sleep most of the day.        Current Allergies (reviewed today): ! CODEINE ! SULFA ! Quita Skye  Past Medical History:    Reviewed history from 06/05/2006 and no changes required:       Depression (03/08/1997)       Depression (12/07/1998)       Hypothyroidism (03/08/1968)       Hypertension (03/08/1992)       Hyperlipidemia (10/04/2000)       Diabetes mellitus, type II (02/05/2006)       COPD (03/08/1998)  Past Surgical History:    Reviewed history from 07/03/2007 and no changes required:       Tonsillectomy       Rotator cuff repair R ([place]), 1984       C/S x 2 Breech/Repeat       Hysterectomy, total due to dysmennorhea, 1985        Cholecystectomy, 1997       Thumb release R, 12/1997       Carpal tunnel repair R 12/1997       EGD nml (due to hoarseness), 12/05/1997       Abd U/S nml x 2 foci in liver, 03/19/1999       CT Abd hemangiomas of liver  1cm R renal cyst       Cardiolite persantine nml, 08/24/2000      Carotid U/S 1-39% ICA stenosis, 08/24/2000       DEXA nml, 07/03/2003       Adenosine Myoview  nml 06/02/2007       Carotid U/S no apprec change  06/02/2007       Echo    06/02/2007   Social History:    Marital Status: Married    Children:     Occupation:     Review of Systems  CV      Denies chest pain or discomfort, palpitations, swelling of feet, and swelling of hands.  Resp      Complains of shortness of breath.      Denies wheezing.  GI      See HPI  Neuro      Complains of disturbances in coordination, falling down, and weakness.      Denies tremors.  Psych      Complains of depression.  Denies anxiety.   Physical Exam  General:     alert, well-developed, well-nourished, and well-hydrated.  30 lb wt gain past 52mo not a good historian--new Eyes:     pupils equal, pupils round, and no injection.   Neck:     no masses, no thyromegaly, no thyroid nodules or tenderness, and no carotid bruits.   Lungs:     normal respiratory effort, no intercostal retractions, no accessory muscle use, and normal breath sounds.   Heart:     normal rate, regular rhythm, and no murmur.   Abdomen:     obese, soft, non-tender, normal bowel sounds, no distention, no masses, .  soft, non-tender, normal bowel sounds, no distention, no masses, no inguinal hernia, no hepatomegaly, and no splenomegaly.   Extremities:     no edema either ankle Neurologic:     gait normal.   Psych:     normally interactive, flat affect, and subdued.  seems more confused today --moves from one topic to another and then back to the first before completing anything    Impression & Recommendations:  Problem # 1:  CHOKING (ICD-933.1) Assessment: New new sx of difficulty swallowing--has hx of EGD with probable dilation 5+ yrs ago--does not remember who did procedure will refer to GI for eval and tx as needed  Problem # 2:  ESOPHAGITIS (ICD-530.10) Assessment: Deteriorated is on Nexium--will be  evaled by GI on visit there  Problem # 3:  VENTRAL HERNIA (ICD-553.20) feels that htis is getting larger, wants to hold on referral to surgeon until sees GI will refer for U/S of Abd to eval change in hernia  Problem # 4:  HYPOTHYROIDISM (ICD-244.9) Assessment: Deteriorated increased sx of fatigue--taking Synthroid as Rxed will get TSH and titrate as needed Her updated medication list for this problem includes:    Synthroid 137 Mcg Tabs (Levothyroxine sodium) .Marland Kitchen... 1 daily by mouth  Labs Reviewed: TSH: 5.56 (01/12/2007)    HgBA1c: 6.0 (01/12/2007) Chol: 234 (01/12/2007)   HDL: 50.3 (01/12/2007)   LDL: DEL (01/12/2007)   TG: 126 (01/12/2007)   Problem # 5:  SOB (ICD-786.05) may be due to increased wt  --had Adenosin cardiolite 05/26/07--nl   Problem # 6:  OBESITY NOS (ICD-278.00) Assessment: Deteriorated has had 30 lb wt gain in 15 mo--encouraged reduction in caloric intake follow  Problem # 7:  NUMBNESS (ICD-782.0) Assessment: Unchanged cannot demonstrate loss of sensation in either foot--may be early neuropathy wants to hold on referral to neurology until sees GI will add Vit B12 to daily meds  Problem # 8:  DIABETES MELLITUS, TYPE II (ICD-250.00) Assessment: Comment Only no recent labs--willl get on next visit. Labs Reviewed: HgBA1c: 6.0 (01/12/2007)   Creat: 1.0 (01/12/2007)   Microalbumin: 1.8 (01/12/2007)   Complete Medication List: 1)  Prozac 10 Mg Caps (Fluoxetine hcl) .Marland Kitchen.. 1 daily by mouth 2)  Wellbutrin Sr 150 Mg Tb12 (Bupropion hcl) .Marland Kitchen.. 1 daily by mouth 3)  Norvasc 5 Mg Tabs (Amlodipine besylate) .Marland Kitchen.. 1 daily by mouth 4)  Spiriva Handihaler 18 Mcg Caps (Tiotropium bromide monohydrate) .... Inhale 1 capsule by mouth once a day 5)  Nexium 40 Mg Cpdr (Esomeprazole magnesium) .Marland Kitchen.. 1 daily by mouth 6)  Synthroid 137 Mcg Tabs (Levothyroxine sodium) .Marland Kitchen.. 1 daily by mouth 7)  Advair Diskus 250-50 Mcg/dose Misc (Fluticasone-salmeterol) .... Inhale 1 puff twice a day  8)  Alprazolam 0.25 Mg Tabs (Alprazolam) .Marland Kitchen.. 1 q6 hrs prn 9)  Allegra 60 Mg Tabs (Fexofenadine hcl) .... One  tab by mouth two times a day prn 10)  Flonase 50 Mcg/act Susp (Fluticasone propionate) .Marland Kitchen.. 1 sp ea nostril every day 11)  Proair Hfa 108 (90 Base) Mcg/act Aers (Albuterol sulfate) .Marland Kitchen.. 1 puff as needed  Other Orders: Gastroenterology Referral (GI)   Patient Instructions: 1)  refer to GI 2)  refer for U/S   ] Prior Medications (reviewed today): PROZAC 10 MG CAPS (FLUOXETINE HCL) 1 daily by mouth WELLBUTRIN SR 150 MG TB12 (BUPROPION HCL) 1 daily by mouth NORVASC 5 MG TABS (AMLODIPINE BESYLATE) 1 daily by mouth SPIRIVA HANDIHALER 18 MCG CAPS (TIOTROPIUM BROMIDE MONOHYDRATE) Inhale 1 capsule by mouth once a day NEXIUM 40 MG CPDR (ESOMEPRAZOLE MAGNESIUM) 1 daily by mouth SYNTHROID 137 MCG TABS (LEVOTHYROXINE SODIUM) 1 daily by mouth ADVAIR DISKUS 250-50 MCG/DOSE MISC (FLUTICASONE-SALMETEROL) Inhale 1 puff twice a day ALPRAZOLAM 0.25 MG TABS (ALPRAZOLAM) 1 q6 hrs prn ALLEGRA 60 MG  TABS (FEXOFENADINE HCL) one tab by mouth two times a day prn FLONASE 50 MCG/ACT  SUSP (FLUTICASONE PROPIONATE) 1 SP EA NOSTRIL EVERY DAY PROAIR HFA 108 (90 BASE) MCG/ACT  AERS (ALBUTEROL SULFATE) 1 PUFF as needed Current Allergies (reviewed today): ! CODEINE ! SULFA ! Quita Skye

## 2010-04-09 NOTE — Assessment & Plan Note (Signed)
Summary: Pulmonary/ ext ov with hfa 75% p coaching   Copy to:  Dr. Leslie Dales Primary Provider/Referring Provider:  Dr. Ferd Glassing  CC:  Increased SOB x 3 wks.  Also c/o chest tightness.Marland Kitchen  History of Present Illness: 45 yowf quit smoking   Dec 2006 with GOLD II COPD  FEV1 1.42 (64%) in  2004   .  September 16, 2009  1st pulmonary office eval in emr  cc COPD followup.  Pt last seen in May 2006.  She c/o worsening SOB for the past several yrs.  She gets out of breath walking up stairs and walking flat surface approx 500 ft.  She states that hot/humid weather makes breathing worse.   supposed to be taking spiriva and advair but not consistent. better after proaire though hfa technique margnial. Notes mild reflux/ dysphagia/ hoarseness also.stop advair Symbicort 2 puffs first thing  in am and 2 puffs again in pm about 12 hours later  Take spiriva after symbicort  if needed proaire 2 puffs every 4 hours with goal of needing it less than twice daily  Nexium Take  one 30-60 min before first meal of the day  November 20, 2009 6 wk followup with PFT's. Pt states that her breathing has improved since last seen.  Still gets SOB walking up stairs.  She c/o "wheezing in throat".   using proaire once a week but also waking up once a week but no need for night time proaire.  no cough. Work on inhaler technique:  try off spiriva  Return to office in 3 months, sooner if needed  If you continue to waken at night with breathing problems try taking pepcid ac 20 mg one at bedtime to see if this helps > did not restart spirva though worse off it, did not start pepcid despite worse breathing and night symptoms  January 13, 2010 Increased SOB x 3 wks.  Also c/o chest tightness. did not restart spiriva did not take pepcid just loosing lots of tums.  chest tightness variably responds to proaire,  feels like a band around her chest bilerally not localized, no assoc nausea,  sleeping poorly x in recliner. better on  relatives cpap machine.  very stressed out.  no purulent sputum. Pt denies any significant sore throat, dysphagia, itching, sneezing,  nasal congestion or excess secretions,  fever, chills, sweats, unintended wt loss, classically pleuritic or exertional cp, hempoptysis, leg swelling  Current Medications (verified): 1)  Prozac 10 Mg Caps (Fluoxetine Hcl) .Marland Kitchen.. 1 Two Times A Day 2)  Norvasc 5 Mg Tabs (Amlodipine Besylate) .Marland Kitchen.. 1 Daily By Mouth 3)  Nexium 40 Mg Cpdr (Esomeprazole Magnesium) .... Take  One 30-60 Min Before First Meal of The Day 4)  Alprazolam 0.25 Mg Tabs (Alprazolam) .... Take One By Mouth Every 6 Hours As Needed 5)  Flonase 50 Mcg/act  Susp (Fluticasone Propionate) .Marland Kitchen.. 1 Sp Ea Nostril Every Day 6)  Proair Hfa 108 (90 Base) Mcg/act  Aers (Albuterol Sulfate) .Marland Kitchen.. 1 Puff As Needed Up To Every 4 Hours 7)  Fexofenadine Hcl 30 Mg Tabs (Fexofenadine Hcl) .... Take 1 Tablet By Mouth Once A Day As Needed 8)  Synthroid 150 Mcg Tabs (Levothyroxine Sodium) .Marland Kitchen.. 1 Daily By Mouth 9)  Crestor 10 Mg Tabs (Rosuvastatin Calcium) .Marland Kitchen.. 1 At Bedtime 10)  Vitamin D 09811 Unit Caps (Ergocalciferol) .Marland Kitchen.. 1 Tablet Weekly By Mouth 11)  Zetia 10 Mg Tabs (Ezetimibe) .... Take One By Mouth Daily 12)  Epipen 0.3  Mg/0.40ml Devi (Epinephrine) .... Use As Directed As Needed 13)  Coenzyme Q10 200 Mg Caps (Coenzyme Q10) .... Take 1 Tablet By Mouth Once A Day 14)  Januvia 50 Mg Tabs (Sitagliptin Phosphate) .... One Tab By Mouth Once Daily 15)  Diovan 160 Mg Tabs (Valsartan) .... One Tab By Mouth Once Daily 16)  Synthroid 25 Mcg Tabs (Levothyroxine Sodium) .... One Tablet By Mouth Every Other Day 17)  Vitamin B-12 500 Mcg  Tabs (Cyanocobalamin) .... 1,000 Mg. Once Daily 18)  Lyrica 50 Mg Caps (Pregabalin) .... Take 1 Capsule By Mouth Two Times A Day 19)  Symbicort 160-4.5 Mcg/act  Aero (Budesonide-Formoterol Fumarate) .... 2 Puffs First Thing  in Am and 2 Puffs Again in Pm About 12 Hours Later 20)  Tylenol Extra  Strength 500 Mg Tabs (Acetaminophen) .... Per Bottle Directions As Needed  Allergies (verified): 1)  ! Codeine 2)  ! Sulfa 3)  ! * Biaxin  Past History:  Past Medical History: Depression (12/07/1998) Hypothyroidism (03/08/1968) Hypertension (03/08/1992) Hyperlipidemia (10/04/2000) Diabetes mellitus, type II (02/05/2006)- Dr. Leslie Dales with endo COPD (03/08/1998)    - PFT"s 12/12/2002  FEV1 1.42 (64%) ratio 58 with no better after B2 and DLCO75%    - PFT's  9/ 15/11    FEV1  1.50 (73%) ratio 50 no better after B2 with DLC0 62%    - hfa 50% November 20, 2009  > 75% January 13, 2010 p coaching VENTRAL HERNIA (ICD-553.20) ESOPHAGITIS (ICD-530.10) EDEMA (ICD-782.3) SOB (ICD-786.05) ALLERGY (ICD-995.3) BRONCHITIS, OBSTRUCTIVE CHRONIC W/EXACRB (ICD-491.21) ROTATOR CUFF SYNDROME, RIGHT (ICD-726.10) SYNDROME, ROTATOR CUFF NOS (ICD-726.10) OBESITY NOS (ICD-278.00) NUMBNESS (ICD-782.0) POSTMENOPAUSAL ON HORMONE REPLACEMENT THERAPY (ICD-V07.4)    Family History: Father dec 75 Emphysema One lung  Mother dec 93 Heart Thyrroid Dz  Brother A 48 Unknown Brother A 66 Osteo Hyperthyr Brother A 62 BACK Probs Sister A 60 Breast Cysts Fibro Back Probs Hypothyr no esophageal cancer No known head and neck cancers. no premature ascvd  Social History: Reviewed history from 09/16/2009 and no changes required. married, 2 children, she is retired, former smoker, drinks 2 alcoholic beverages per week, drinks 4 caffeinated beverages per day.  Enjoys painting.   Former smoker.  Quit on July 9th 2011.  She smoked 40 yrs up to 1 ppd  Vital Signs:  Patient profile:   75 year old female Weight:      231.50 pounds O2 Sat:      96 % on Room air Temp:     98.2 degrees F oral Pulse rate:   70 / minute BP sitting:   120 / 72  (left arm)  Vitals Entered By: Vernie Murders (January 13, 2010 11:35 AM)  O2 Flow:  Room air  Physical Exam  Additional Exam:  very hoarse anxious  wf with classic  pseudowheeze improves  with purse lip maneuver  wt 229 September 16, 2009 >  227 November 20, 2009 > 231 January 13, 2010  HEENT mild turbinate edema.  Oropharynx no thrush or excess pnd or cobblestoning.  No JVD or cervical adenopathy. Mild accessory muscle hypertrophy. Trachea midline, nl thryroid. Chest was hyperinflated by percussion with diminished breath sounds and moderate increased exp time without wheeze. Hoover sign positive at mid inspiration. Regular rate and rhythm without murmur gallop or rub or increase P2 or edema.  Abd: no hsm, nl excursion. Ext warm without cyanosis or clubbing.       Creatine Kinase  109 U/L                     7-177   Creatine Kinase Mb        2.2 ng/mL                   0.3-4.0   Relative Index            2.0 calc                    0.0-2.5     Relative Index is invalid when CK is <100 U/L  Tests: (2) CBC Platelet w/Diff (CBCD)   White Cell Count          5.9 K/uL                    4.5-10.5   Red Cell Count            4.21 Mil/uL                 3.87-5.11   Hemoglobin                12.8 g/dL                   16.1-09.6   Hematocrit                37.2 %                      36.0-46.0   MCV                       88.2 fl                     78.0-100.0   MCHC                      34.3 g/dL                   04.5-40.9   RDW                       14.3 %                      11.5-14.6   Platelet Count            231.0 K/uL                  150.0-400.0   Neutrophil %              54.3 %                      43.0-77.0   Lymphocyte %              33.7 %                      12.0-46.0   Monocyte %                9.7 %                       3.0-12.0   Eosinophils%              2.1 %  0.0-5.0   Basophils %               0.2 %                       0.0-3.0   Neutrophill Absolute      3.2 K/uL                    1.4-7.7   Lymphocyte Absolute       2.0 K/uL                    0.7-4.0   Monocyte Absolute         0.6 K/uL                     0.1-1.0  Eosinophils, Absolute                             0.1 K/uL                    0.0-0.7   Basophils Absolute        0.0 K/uL                    0.0-0.1  Tests: (3) B-Type Natiuretic Peptide (BNPR)  B-Type Natriuetic Peptide                             36.2 pg/mL                  0.0-100.0  CXR  Procedure date:  01/13/2010  Findings:      Comparison: 09/16/2009   Findings: The lungs are mildly hyperexpanded but clear.  No edema, confluent airspace opacities or pleural effusions are seen.  The heart is normal in size with atherosclerotic calcifications seen in the aortic arch.  The upper abdomen and osseous structures are unremarkable.   IMPRESSION: Mild hyperexpansion without acute findings  Impression & Recommendations:  Problem # 1:  COPD (ICD-496)   DDX of  difficult airways managment all start with A and  include Adherence, Ace Inhibitors, Acid Reflux, Active Sinus Disease, Alpha 1 Antitripsin deficiency, Anxiety masquerading as Airways dz,  ABPA,  allergy(esp in young), Aspiration (esp in elderly), Adverse effects of DPI,  Active smokers, plus one B  = Beta blocker use.. and one C = CHF  Adherence:  not processing contingency instructions, try simplified approach.  See instructions for specific recommendations  ? acid reflux:  add pepcid at hs, reinforce diet and PPI 30-60 min before first meal of the day   ? CHF   bnp nl so very unlikely  ? Anxiety dx of exclusion  Problem # 2:  CHEST PAIN (ICD-786.50) Atypical, ? either gerd or ibs but not lateralizing  or classically pleuritic,  better with saba so may be due to air trapping that worsened off spiriva.   No evidence IHD or PE though is at risk.    Problem # 3:  SLEEP DISORDER UNSPECIFIED (ICD-780.50) Using relative's cpap machine when she can and o/w in recliner > referred for formal sleep study    Medications Added to Medication List This Visit: 1)  Crestor 10 Mg Tabs (Rosuvastatin calcium) .Marland Kitchen.. 1 at  bedtime 2)  Tylenol Extra Strength 500 Mg Tabs (Acetaminophen) .... Per bottle directions as needed 3)  Spiriva Handihaler  18 Mcg Caps (Tiotropium bromide monohydrate) .... Two puffs in handihaler daily 4)  Pepcid 20 Mg Tabs (Famotidine) .... Take one by mouth at bedtime  Other Orders: EKG w/ Interpretation (93000) Misc. Referral (Misc. Ref) Est. Patient Level IV (08657) HFA Instruction [phone]) T-2 View CXR (71020TC) TLB-Cardiac Panel ([id number]) TLB-CBC Platelet - w/Differential (85025-CBCD) TLB-BNP (B-Natriuretic Peptide) (83880-BNPR)  Patient Instructions: 1)  Resume spriva every day 2)  Your inhaler technique has improved - keep it up 3)  ok to use the proaire every 4 hours if needed 4)  Add pepcid 20mg  one at bedtime 5)  GERD (REFLUX)  is a common cause of respiratory symptoms. It commonly presents without heartburn and can be treated with medication, but also with lifestyle changes including avoidance of late meals, excessive alcohol, smoking cessation, and avoid fatty foods, chocolate, peppermint, colas, red wine, and acidic juices such as orange juice. NO MINT OR MENTHOL PRODUCTS SO NO COUGH DROPS  6)  USE SUGARLESS CANDY INSTEAD (jolley ranchers)  7)  NO OIL BASED VITAMINS  8)  See Patient Care Coordinator before leaving for sleep study 9)  Please schedule a follow-up appointment in 4  weeks, sooner if needed

## 2010-04-09 NOTE — Progress Notes (Signed)
Summary: Nuclear Pre-Procedure  Phone Note Outgoing Call   Call placed by: Milana Na, EMT-P,  February 17, 2010 3:30 PM Summary of Call: Reviewed information on [facility] Information Sheet (see scanned document for further details).  Spoke with patient.     Nuclear Med Background Indications for Stress Test: Evaluation for Ischemia   History: Abnormal EKG, COPD, Echo, Myocardial Perfusion Study  History Comments: 03/09 MPS NL EF 65% 02/05/10 ECHO EF 50-55%  Symptoms: Chest Pain, Chest Pain with Exertion, DOE    Nuclear Pre-Procedure Cardiac Risk Factors: Carotid Disease, Claudication, Family History - CAD, History of Smoking, Hypertension, Lipids, NIDDM, Obesity, PVD Height (in): 65  Nuclear Med Study Referring MD:  T.Gollan

## 2010-04-09 NOTE — Progress Notes (Signed)
Summary: refill request for alprazolam  Phone Note Refill Request Message from:  Fax from Pharmacy  Refills Requested: Medication #1:  ALPRAZOLAM 0.25 MG TABS Take one by mouth every 6 hours as needed   Last Refilled: 05/22/2009 [facility] [address], [phone].  Initial call taken by: Lowella Petties CMA,  December 24, 2009 12:14 PM  Follow-up for Phone Call        please call in.  Follow-up by: Crawford Givens MD,  December 24, 2009 1:47 PM  Additional Follow-up for Phone Call Additional follow up Details #1::        Medication phoned to pharmacy.  Additional Follow-up by: Delilah Shan CMA Hjalmer Iovino Dull),  December 24, 2009 2:16 PM    Prescriptions: ALPRAZOLAM 0.25 MG TABS (ALPRAZOLAM) Take one by mouth every 6 hours as needed  #60 x 0   Entered and Authorized by:   Crawford Givens MD   Signed by:   Crawford Givens MD on 12/24/2009   Method used:   Telephoned to ...       [facility] ([facility]ail)       [address]       [place], [place]  [address]       Ph: [phone] or [phone]       Fax: [phone]   RxID:   [id number]

## 2010-04-09 NOTE — Progress Notes (Signed)
Summary: RX Advair and Prozac  Phone Note Refill Request Call back at [phone] Message from:  [facility] on November 12, 2008 12:38 PM  Refills Requested: Medication #1:  PROZAC 10 MG CAPS 1 daily by mouth  Medication #2:  ADVAIR DISKUS 250-50 MCG/DOSE MISC Inhale 1 puff twice a day Received faxed request. Request 90 day supply with refills.   Method Requested: Electronic Initial call taken by: Sydell Axon LPN,  November 12, 2008 12:40 PM    Prescriptions: PROZAC 10 MG CAPS (FLUOXETINE HCL) 1 daily by mouth  #90 x 4   Entered and Authorized by:   Shaune Leeks MD   Signed by:   Shaune Leeks MD on 11/12/2008   Method used:   Printed then faxed to ...       [facility] (mail-order)             ,          Ph: [phone]       Fax: [phone]   RxID:   [id number] ADVAIR DISKUS 250-50 MCG/DOSE MISC (FLUTICASONE-SALMETEROL) Inhale 1 puff twice a day  #3 x 4   Entered and Authorized by:   Shaune Leeks MD   Signed by:   Shaune Leeks MD on 11/12/2008   Method used:   Printed then faxed to ...       [facility]* (mail-order)             ,          Ph: [phone]       Fax: [phone]   RxID:   [id number] ADVAIR DISKUS 250-50 MCG/DOSE MISC (FLUTICASONE-SALMETEROL) Inhale 1 puff twice a day  #1 diskus x 12   Entered and Authorized by:   Shaune Leeks MD   Signed by:   Shaune Leeks MD on 11/12/2008   Method used:   Electronically to        [facility]* (mail-order)             ,          Ph: [phone]       Fax: [phone]   RxID:   [id number] PROZAC 10 MG CAPS (FLUOXETINE HCL) 1 daily by mouth  #90 x 4   Entered and Authorized by:   Shaune Leeks MD   Signed by:   Shaune Leeks MD on 11/12/2008   Method used:   Electronically to        [facility] (mail-order)             ,          Ph: [phone]       Fax: [phone]   RxID:   [id number]  please cancel the  local scripts,...sorry. RNS.

## 2010-04-09 NOTE — Miscellaneous (Signed)
Summary: Orders Update pft charges  Clinical Lists Changes  Orders: Added new Service order of Carbon Monoxide diffusing w/capacity (94720) - Signed Added new Service order of Lung Volumes (94240) - Signed Added new Service order of Spirometry (Pre & Post) (94060) - Signed 

## 2010-04-09 NOTE — Miscellaneous (Signed)
   Clinical Lists Changes  Observations: Added new observation of MAMMO DUE: 02/25/2011 (02/24/2010 12:29) Added new observation of MAMMOGRAM: Normal (02/24/2010 12:29)

## 2010-04-09 NOTE — Assessment & Plan Note (Signed)
Summary: EC6/AMD  Medications Added NITROSTAT 0.4 MG SUBL (NITROGLYCERIN) 1 tablet under tongue at onset of chest pain; you may repeat every 5 minutes for up to 3 doses. ISOSORBIDE MONONITRATE CR 30 MG XR24H-TAB (ISOSORBIDE MONONITRATE) Take 1 tablet by mouth daily      Allergies Added:   Visit Type:  Follow-up Referring Provider:  Dr. Leslie Dales Primary Provider:  Dr. Ferd Glassing  CC:  c/o chest pain and shortness of breath since April 2011.  She has a constant pain that does not go away.  She does have pain in shoulder blades that radiates down to both her elbows with the chest pain.Marland Kitchen  History of Present Illness: 75 year old woman with mild obesity, no known coronary artery disease, long history of smoking for 50 years, hyperlipidemia, hypertension who presents for evaluation of chest discomfort.  She reports that her chest discomfort started earlier in the year and has been progressive. She currently takes significant amount of abdomen or discomfort. It happens more with exertion, goes away with rest. She also has associated shortness of breath with exertion. She has to stop, sit down and wait until her symptoms go away. The pain radiates across her sternum into her left breast area and into the arms.  She also has significant cramps in her legs.  EKG shows normal sinus rhythm with rate 67 minute, poor R-wave progression through the precordial leads concerning for anteroseptal infarct  Current Medications (verified): 1)  Prozac 10 Mg Caps (Fluoxetine Hcl) .Marland Kitchen.. 1 Two Times A Day 2)  Norvasc 5 Mg Tabs (Amlodipine Besylate) .Marland Kitchen.. 1 Daily By Mouth 3)  Nexium 40 Mg Cpdr (Esomeprazole Magnesium) .... Take  One 30-60 Min Before First Meal of The Day 4)  Alprazolam 0.25 Mg Tabs (Alprazolam) .... Take One By Mouth Every 6 Hours As Needed 5)  Flonase 50 Mcg/act  Susp (Fluticasone Propionate) .Marland Kitchen.. 1 Sp Ea Nostril Every Day 6)  Proair Hfa 108 (90 Base) Mcg/act  Aers (Albuterol Sulfate)  .Marland Kitchen.. 1 Puff As Needed Up To Every 4 Hours 7)  Fexofenadine Hcl 30 Mg Tabs (Fexofenadine Hcl) .... Take 1 Tablet By Mouth Once A Day As Needed 8)  Synthroid 150 Mcg Tabs (Levothyroxine Sodium) .Marland Kitchen.. 1 Daily By Mouth 9)  Crestor 10 Mg Tabs (Rosuvastatin Calcium) .Marland Kitchen.. 1 At Bedtime 10)  Vitamin D 16109 Unit Caps (Ergocalciferol) .Marland Kitchen.. 1 Tablet Weekly By Mouth 11)  Zetia 10 Mg Tabs (Ezetimibe) .... Take One By Mouth Daily 12)  Epipen 0.3 Mg/0.82ml Devi (Epinephrine) .... Use As Directed As Needed 13)  Coenzyme Q10 200 Mg Caps (Coenzyme Q10) .... Take 1 Tablet By Mouth Once A Day 14)  Januvia 50 Mg Tabs (Sitagliptin Phosphate) .... One Tab By Mouth Once Daily 15)  Diovan 160 Mg Tabs (Valsartan) .... One Tab By Mouth Once Daily 16)  Synthroid 25 Mcg Tabs (Levothyroxine Sodium) .... One Tablet By Mouth Every Other Day 17)  Vitamin B-12 500 Mcg  Tabs (Cyanocobalamin) .... 1,000 Mg. Once Daily 18)  Symbicort 160-4.5 Mcg/act  Aero (Budesonide-Formoterol Fumarate) .... 2 Puffs First Thing  in Am and 2 Puffs Again in Pm About 12 Hours Later 19)  Tylenol Extra Strength 500 Mg Tabs (Acetaminophen) .... Per Bottle Directions As Needed 20)  Spiriva Handihaler 18 Mcg  Caps (Tiotropium Bromide Monohydrate) .... Two Puffs in Handihaler Daily 21)  Pepcid 20 Mg Tabs (Famotidine) .... Take One By Mouth At Bedtime  Allergies (verified): 1)  ! Codeine 2)  ! Sulfa 3)  ! *  Biaxin  Past History:  Past Medical History: Last updated: 01/13/2010 Depression (12/07/1998) Hypothyroidism (03/08/1968) Hypertension (03/08/1992) Hyperlipidemia (10/04/2000) Diabetes mellitus, type II (02/05/2006)- Dr. Leslie Dales with endo COPD (03/08/1998)    - PFT"s 12/12/2002  FEV1 1.42 (64%) ratio 58 with no better after B2 and DLCO75%    - PFT's  9/ 15/11    FEV1  1.50 (73%) ratio 50 no better after B2 with DLC0 62%    - hfa 50% November 20, 2009  > 75% January 13, 2010 p coaching VENTRAL HERNIA (ICD-553.20) ESOPHAGITIS  (ICD-530.10) EDEMA (ICD-782.3) SOB (ICD-786.05) ALLERGY (ICD-995.3) BRONCHITIS, OBSTRUCTIVE CHRONIC W/EXACRB (ICD-491.21) ROTATOR CUFF SYNDROME, RIGHT (ICD-726.10) SYNDROME, ROTATOR CUFF NOS (ICD-726.10) OBESITY NOS (ICD-278.00) NUMBNESS (ICD-782.0) POSTMENOPAUSAL ON HORMONE REPLACEMENT THERAPY (ICD-V07.4)    Past Surgical History: Last updated: 01/26/2009 Tonsillectomy Rotator cuff repair R ([place]), 1984 C/S x 2 Breech/Repeat Hysterectomy, total due to dysmennorhea, 1985  Cholecystectomy, 1997 Thumb release R, 12/1997 Carpal tunnel repair R 12/1997 EGD nml (due to hoarseness), 12/05/1997 Abd U/S nml x 2 foci in liver, 03/19/1999 CT Abd hemangiomas of liver  1cm R renal cyst Cardiolite persantine nml, 08/24/2000 Carotid U/S 1-39% ICA stenosis, 08/24/2000 DEXA nml, 07/03/2003 Adenosine Myoview  nml 06/02/2007 Carotid U/S no apprec change  06/02/2007 Echo    06/02/2007 Lap Ventr Hernia Repair w/ Lysis of Adhesions (Dr Dwain Sarna) 01/24/2009  Family History: Last updated: 01/13/2010 Father dec 75 Emphysema One lung  Mother dec 93 Heart Thyrroid Dz  Brother A 68 Unknown Brother A 66 Osteo Hyperthyr Brother A 62 BACK Probs Sister A 60 Breast Cysts Fibro Back Probs Hypothyr no esophageal cancer No known head and neck cancers. no premature ascvd  Social History: Last updated: 09/16/2009 married, 2 children, she is retired, former smoker, drinks 2 alcoholic beverages per week, drinks 4 caffeinated beverages per day.  Enjoys painting.   Former smoker.  Quit on July 9th 2011.  She smoked 40 yrs up to 1 ppd  Risk Factors: Alcohol Use: <1 (07/12/2006) Caffeine Use: 5+ (01/18/2008) Exercise: no (07/12/2006)  Risk Factors: Smoking Status: quit (06/05/2006) Passive Smoke Exposure: no (07/12/2006)  Review of Systems       The patient complains of chest pain and dyspnea on exertion.  The patient denies fever, weight loss, weight gain, vision loss, decreased hearing,  hoarseness, syncope, peripheral edema, prolonged cough, abdominal pain, incontinence, muscle weakness, depression, and enlarged lymph nodes.    Vital Signs:  Patient profile:   75 year old female Height:      65 inches Weight:      227.75 pounds BMI:     38.04 Pulse rate:   71 / minute BP sitting:   162 / 84  (left arm) Cuff size:   large  Vitals Entered By: Bishop Dublin, CMA (February 04, 2010 2:56 PM)  Physical Exam  General:  Well developed, well nourished, in no acute distress. Mildly obese Head:  normocephalic and atraumatic Neck:  Neck supple, no JVD. No masses, thyromegaly or abnormal cervical nodes. Lungs:  Clear bilaterally to auscultation and percussion. Heart:  Non-displaced PMI, chest non-tender; regular rate and rhythm, S1, S2 without murmurs, rubs or gallops. Carotid upstroke normal, no bruit.  Pedals normal pulses. No edema, no varicosities. Abdomen:  Bowel sounds positive; abdomen soft and non-tender without masses Msk:  Back normal, normal gait. Muscle strength and tone normal. Pulses:  pulses normal in all 4 extremities Extremities:  No clubbing or cyanosis. Neurologic:  Alert and oriented x 3. Skin:  Intact  without lesions or rashes. Psych:  Normal affect.   Impression & Recommendations:  Problem # 1:  CHEST PAIN (ICD-786.50) etiology to her chest pain is concerning for angina. She does have a long history of smoking, history of hyperlipidemia. She does have an abnormal EKG concerning for old anteroseptal infarct. We have discussed the treatment options for her and as she is very concerned about her symptoms, we will try to get her preapproved for a cardiac catheterization.  she has had a stress test 2 years ago.  For her symptoms of chest pain, we have suggested that she try sublingual nitroglycerin, also she could try low dose isosorbide mononitrate starting 50 mg daily titrating upward as side effects will permit.   Her updated medication list for this  problem includes:    Norvasc 5 Mg Tabs (Amlodipine besylate) .Marland Kitchen... 1 daily by mouth    Nitrostat 0.4 Mg Subl (Nitroglycerin) .Marland Kitchen... 1 tablet under tongue at onset of chest pain; you may repeat every 5 minutes for up to 3 doses.    Isosorbide Mononitrate Cr 30 Mg Xr24h-tab (Isosorbide mononitrate) .Marland Kitchen... Take 1 tablet by mouth daily  Orders: Echocardiogram (Echo)  Problem # 2:  MURMUR (ICD-785.2) Murmur is consistent with aortic valve disease. She will have echocardiogram to confirm this and to rule out other pathology given her chest pain.  Her updated medication list for this problem includes:    Diovan 160 Mg Tabs (Valsartan) ..... One tab by mouth once daily    Nitrostat 0.4 Mg Subl (Nitroglycerin) .Marland Kitchen... 1 tablet under tongue at onset of chest pain; you may repeat every 5 minutes for up to 3 doses.    Isosorbide Mononitrate Cr 30 Mg Xr24h-tab (Isosorbide mononitrate) .Marland Kitchen... Take 1 tablet by mouth daily  Orders: Echocardiogram (Echo)  Problem # 3:  HYPERLIPIDEMIA (ICD-272.4) Cholesterol is well controlled on her current medication regimen.  Her updated medication list for this problem includes:    Crestor 10 Mg Tabs (Rosuvastatin calcium) .Marland Kitchen... 1 at bedtime    Zetia 10 Mg Tabs (Ezetimibe) .Marland Kitchen... Take one by mouth daily  Problem # 4:  HYPERTENSION (ICD-401.9) Blood pressure is elevated today and we've asked her to monitor her blood pressure at home. He made any changes to her medications apart from adding a low-dose nitrate.  Her updated medication list for this problem includes:    Norvasc 5 Mg Tabs (Amlodipine besylate) .Marland Kitchen... 1 daily by mouth    Diovan 160 Mg Tabs (Valsartan) ..... One tab by mouth once daily  Patient Instructions: 1)  Your physician recommends that you schedule a follow-up appointment in: 4 weeks 2)  Your physician has recommended you make the following change in your medication: Start taking Isosorbide Mononitrate 30mg  1/2 tablet daily can increase to 1 tablet  daily as needed for chest pain. Also start taking Nitroglycerin 0.4mg  sublingal as needed for chest pain. 3)  Your physician has requested that you have an echocardiogram.  Echocardiography is a painless test that uses sound waves to create images of your heart. It provides your doctor with information about the size and shape of your heart and how well your heart's chambers and valves are working.  This procedure takes approximately one hour. There are no restrictions for this procedure. Prescriptions: ISOSORBIDE MONONITRATE CR 30 MG XR24H-TAB (ISOSORBIDE MONONITRATE) Take 1 tablet by mouth daily  #30 x 6   Entered by:   Cloyde Reams RN   Authorized by:   Dossie Arbour MD   Signed by:  Cloyde Reams RN on 02/04/2010   Method used:   [facility]        [facility] [id number]       [address]       [place], [place]  [address]       Ph: [phone] or [phone]       Fax: [phone]   RxID:   [id number] NITROSTAT 0.4 MG SUBL (NITROGLYCERIN) 1 tablet under tongue at onset of chest pain; you may repeat every 5 minutes for up to 3 doses.  #25 x 3   Entered by:   Cloyde Reams RN   Authorized by:   Dossie Arbour MD   Signed by:   Cloyde Reams RN on 02/04/2010   Method used:   [facility]        [facility]       [address]       [place], [place]  [address]       Ph: [phone] or [phone]       Fax: [phone]   RxID:   [id number]

## 2010-04-09 NOTE — Assessment & Plan Note (Signed)
Summary: JOINT PAIN,FATIGUE,NUMBNESS IN FEET/   Vital Signs:  Patient profile:   75 year old female Weight:      222.75 pounds Temp:     98.0 degrees F oral Pulse rate:   76 / minute Pulse rhythm:   regular BP sitting:   130 / 70  (right arm) Cuff size:   large  Vitals Entered By: Sydell Axon LPN (June 30, 2009 10:12 AM) CC: Joint pain, fatigue, and numbness in feet, was in [facility] back in December   History of Present Illness: Was in hospital in Dec for C. Difficle. When she left the hosp she "was happy."  She was just in [place] with her daughter with a new Granddaughter and could't help so came home.  She now thinks she needs something. She is having lots of gas recently and she has never had constipation in the past but now has "streams" but going routinely so doesn't sound like a problem. She has no stamina and is short with her husband  Toes to instep are numb and cramp all the time. She also feels like are on her legs all the time. She had gone to a foot doctor. She was given splints for her feet but she has a lot of trouble getting them on. She was prescribed   Lyrica in Nov and hasn't gotten it yet. She also has pain in the bicep area of right arm and shoulders bilat. Her toenails were checked for fungus also, results were lost, second culture was sent and found she had fungus. She was given polish to apply.   Problems Prior to Update: 1)  Palpitations  (ICD-785.1) 2)  Need Prophylactic Vaccination&inoculation Flu  (ICD-V04.81) 3)  Hoarseness  (ICD-784.49) 4)  Esophageal Stenosis  (ICD-530.3) 5)  Choking  (ICD-933.1) 6)  Dysphagia  (ICD-787.29) 7)  Ventral Hernia  (ICD-553.20) 8)  Esophagitis  (ICD-530.10) 9)  Edema  (ICD-782.3) 10)  Sob  (ICD-786.05) 11)  Allergy  (ICD-995.3) 12)  Bronchitis, Obstructive Chronic W/exacrb  (ICD-491.21) 13)  Rotator Cuff Syndrome, Right  (ICD-726.10) 14)  Obesity Nos  (ICD-278.00) 15)  Numbness  (ICD-782.0) 16)  Postmenopausal On  Hormone Replacement Therapy  (ICD-V07.4) 17)  COPD  (ICD-496) 18)  Diabetes Mellitus, Type II  (ICD-250.00) 19)  Hyperlipidemia  (ICD-272.4) 20)  Hypertension  (ICD-401.9) 21)  Hypothyroidism  (ICD-244.9) 22)  Depression  (ICD-311)  Medications Prior to Update: 1)  Prozac 10 Mg Caps (Fluoxetine Hcl) .Marland Kitchen.. 1 Daily By Mouth 2)  Wellbutrin Sr 150 Mg Tb12 (Bupropion Hcl) .Marland Kitchen.. 1 Daily By Mouth 3)  Norvasc 5 Mg Tabs (Amlodipine Besylate) .Marland Kitchen.. 1 Daily By Mouth 4)  Spiriva Handihaler 18 Mcg Caps (Tiotropium Bromide Monohydrate) .... Inhale 1 Capsule By Mouth Once A Day 5)  Nexium 40 Mg Cpdr (Esomeprazole Magnesium) .Marland Kitchen.. 1 Daily By Mouth 6)  Advair Diskus 250-50 Mcg/dose Misc (Fluticasone-Salmeterol) .... Inhale 1 Puff Twice A Day 7)  Alprazolam 0.25 Mg Tabs (Alprazolam) .... Take One By Mouth Every 6 Hours As Needed 8)  Flonase 50 Mcg/act  Susp (Fluticasone Propionate) .Marland Kitchen.. 1 Sp Ea Nostril Every Day 9)  Proair Hfa 108 (90 Base) Mcg/act  Aers (Albuterol Sulfate) .Marland Kitchen.. 1 Puff As Needed Up To Every 4 Hours 10)  Fexofenadine Hcl 30 Mg Tabs (Fexofenadine Hcl) .... Take 1 Tablet By Mouth Once A Day As Needed 11)  Synthroid 150 Mcg Tabs (Levothyroxine Sodium) .Marland Kitchen.. 1 Daily By Mouth 12)  Crestor 10 Mg Tabs (Rosuvastatin Calcium) .... Take  1 Tablet By Mouth At Bedtime  (Takes 3 /week) 13)  Vitamin D 54098 Unit Caps (Ergocalciferol) .Marland Kitchen.. 1 Tablet Weekly By Mouth 14)  Zetia 10 Mg Tabs (Ezetimibe) .... Take One By Mouth Daily 15)  Vitamin D (Ergocalciferol) 50000 Unit Caps (Ergocalciferol) .... Take One By Mouth Weekly 16)  Epipen 0.3 Mg/0.8ml Devi (Epinephrine) .... Use As Directed As Needed 17)  Coenzyme Q10 200 Mg Caps (Coenzyme Q10) .... Take 1 Tablet By Mouth Once A Day 18)  Lyrica 50 Mg Caps (Pregabalin) .... Take 1 Capsule By Mouth Once A Day 19)  Augmentin 500-125 Mg Tabs (Amoxicillin-Pot Clavulanate) .... One Tab By Mouth Three Times A Day  Allergies: 1)  ! Codeine 2)  ! Sulfa 3)  ! *  Biaxin  Physical Exam  General:  Well-developed,well-nourished,in no acute distress; alert,appropriate and cooperative throughout examination. Head:  Normocephalic and atraumatic without obvious abnormalities. No apparent alopecia or balding. Sinuses NT. Eyes:  Conjunctiva clear bilaterally.  Ears:  External ear exam shows no significant lesions or deformities.  Otoscopic examination reveals clear canals, tympanic membranes are intact bilaterally without bulging, retraction, inflammation or discharge. Hearing is grossly normal bilaterally. Mild dullness to LR. Nose:  External nasal examination shows no deformity or inflammation. Nasal mucosa are pink and moist without lesions or exudates. Mouth:  Oral mucosa and oropharynx without lesions or exudates.  Teeth in good repair. Neck:  no carotid bruit or thyromegaly no cervical or supraclavicular lymphadenopathy  Chest Wall:  No deformities, masses, or tenderness noted. Lungs:  Normal respiratory effort, chest expands symmetrically. Lungs are clear to auscultation, no crackles or wheezes. Good air movement today. Heart:  Normal rate and regular rhythm. S1 and S2 normal without gallop, murmur, click, rub or other extra sounds. Abdomen:  Bowel sounds positive,abdomen soft and non-tender without masses, organomegaly or hernias noted. Mildly tympanitic globally.   Impression & Recommendations:  Problem # 1:  ESOPHAGEAL STENOSIS (ICD-530.3) Assessment Unchanged She has all kinds of documented gastrointestinal problems but I think her sxs sound more like excessive gas production than anything else at this point and suggest trying to avoid milk and milk products to see if sxs improve. The price is right, it involves no meds and she is in charge...alll acceptable to her.  Problem # 2:  DEPRESSION (ICD-311) Assessment: Unchanged Is off Wellbutrin and seems to be doing well except agitated, for which she is not willing to take anything else. Long  discussion. No conscensous. Will follow. "Happiness is a choice" as she has reminded me in the past!! The following medications were removed from the medication list:    Wellbutrin Sr 150 Mg Tb12 (Bupropion hcl) .Marland Kitchen... 1 daily by mouth Her updated medication list for this problem includes:    Prozac 10 Mg Caps (Fluoxetine hcl) .Marland Kitchen... 1 daily by mouth    Alprazolam 0.25 Mg Tabs (Alprazolam) .Marland Kitchen... Take one by mouth every 6 hours as needed  Problem # 3:  LEG PAIN (ICD-729.5) Assessment: New She has Lyrica if she wants to take it. I also suggested more exercise in the form of walking would help. She is not in favor of more medication and has been scared of Lyrica in the past.  Problem # 4:  ONYCHOMYCOSIS, TOENAILS (ICD-110.1) Assessment: New Discussed at length. My opinion is that it is not worth treating. Could apply Vicks or Camphor  daily....safe and not systemic. Also reasonable financially.   Problem # 5:  DIABETES MELLITUS, TYPE II (ICD-250.00) Assessment: Unchanged  Sees diabetic specialist. Is now on Januvia. Will not muddy the water by making suggestions here. Cont with her doctor. Was also recently put on Diovan with no untoward results. Labs Reviewed: Creat: 1.2 (11/28/2007)    Reviewed HgBA1c results: 6.0 (01/12/2007)  5.8 (06/14/2006)  Her updated medication list for this problem includes:    Januvia 50 Mg Tabs (Sitagliptin phosphate) ..... One tab by mouth once daily    Diovan 160 Mg Tabs (Valsartan) ..... One tab by mouth once daily  Problem # 6:  HYPERTENSION (ICD-401.9) Assessment: Unchanged Adequate on curr meds, cont. Her updated medication list for this problem includes:    Norvasc 5 Mg Tabs (Amlodipine besylate) .Marland Kitchen... 1 daily by mouth    Diovan 160 Mg Tabs (Valsartan) ..... One tab by mouth once daily  BP today: 130/70 Prior BP: 120/70 (01/08/2009)  Labs Reviewed: K+: 4.6 (11/28/2007) Creat: : 1.2 (11/28/2007)   Chol: 234 (01/12/2007)   HDL: 50.3 (01/12/2007)    LDL: DEL (01/12/2007)   TG: 126 (01/12/2007)  Problem # 7:  ROTATOR CUFF SYNDROME, RIGHT (ICD-726.10) Assessment: Unchanged Continues to bother her but aggravation rather than impedimaent. Would cont as is. Is not worth surgery nat curr discomfort level.  Complete Medication List: 1)  Prozac 10 Mg Caps (Fluoxetine hcl) .Marland Kitchen.. 1 daily by mouth 2)  Norvasc 5 Mg Tabs (Amlodipine besylate) .Marland Kitchen.. 1 daily by mouth 3)  Spiriva Handihaler 18 Mcg Caps (Tiotropium bromide monohydrate) .... Inhale 1 capsule by mouth once a day 4)  Nexium 40 Mg Cpdr (Esomeprazole magnesium) .Marland Kitchen.. 1 daily by mouth 5)  Advair Diskus 250-50 Mcg/dose Misc (Fluticasone-salmeterol) .... Inhale 1 puff twice a day 6)  Alprazolam 0.25 Mg Tabs (Alprazolam) .... Take one by mouth every 6 hours as needed 7)  Flonase 50 Mcg/act Susp (Fluticasone propionate) .Marland Kitchen.. 1 sp ea nostril every day 8)  Proair Hfa 108 (90 Base) Mcg/act Aers (Albuterol sulfate) .Marland Kitchen.. 1 puff as needed up to every 4 hours 9)  Fexofenadine Hcl 30 Mg Tabs (Fexofenadine hcl) .... Take 1 tablet by mouth once a day as needed 10)  Synthroid 150 Mcg Tabs (Levothyroxine sodium) .Marland Kitchen.. 1 daily by mouth 11)  Crestor 10 Mg Tabs (Rosuvastatin calcium) .... Take 1 tablet by mouth at bedtime  (takes 3 /week) 12)  Vitamin D 01601 Unit Caps (Ergocalciferol) .Marland Kitchen.. 1 tablet weekly by mouth 13)  Zetia 10 Mg Tabs (Ezetimibe) .... Take one by mouth daily 14)  Epipen 0.3 Mg/0.26ml Devi (Epinephrine) .... Use as directed as needed 15)  Coenzyme Q10 200 Mg Caps (Coenzyme q10) .... Take 1 tablet by mouth once a day 16)  Januvia 50 Mg Tabs (Sitagliptin phosphate) .... One tab by mouth once daily 17)  Diovan 160 Mg Tabs (Valsartan) .... One tab by mouth once daily  Patient Instructions: 1)  Needs eye exam.  Current Allergies (reviewed today): ! CODEINE ! SULFA ! * BIAXIN  Appended Document: JOINT PAIN,FATIGUE,NUMBNESS IN FEET/ Will refer to GI for further eval if gas and distention cont to  be a problem and milk prod restriction does not help.

## 2010-04-09 NOTE — Miscellaneous (Signed)
   Clinical Lists Changes  Observations: Added new observation of VIT D 25-OH: 41.2 ng/mL (11/25/2009 11:00) Added new observation of CREATININE: 1.2 mg/dL (16/12/9602 54:09) Added new observation of LDL: 71 mg/dL (81/19/1478 29:56) Added new observation of HDL: 50 mg/dL (21/30/8657 84:69) Added new observation of TRIGLYC TOT: 161 mg/dL (62/95/2841 32:44) Added new observation of CHOLESTEROL: 153 mg/dL (03/10/7251 66:44)

## 2010-04-09 NOTE — Assessment & Plan Note (Signed)
Summary: PER DR. Zeynep Fantroy// not new /whc   Vital Signs:  Patient Profile:   75 Years Old Female Height:     64.50 inches (164.47 cm) Weight:      231 pounds Temp:     97.9 degrees F oral Pulse rate:   76 / minute Pulse rhythm:   regular BP sitting:   120 / 80  (left arm) Cuff size:   regular  Vitals Entered By: Providence Crosby (January 18, 2008 11:23 AM)                 PCP:  Luretha Rued  Chief Complaint:  CHECKUP JUST SAW GI.  History of Present Illness: Pt here for Comp Exam  followup. Pt has been seen by GI recently.  She also ses Dr Yolanda Bonine for [facility] And Dr Senaida Ores for Gyn and she does this regularly yearly. She has no real complaints today. She does occas get SOB w/ exertion but has just recently quit smoking after long history.    Prior Medications Reviewed Using: Patient Recall  Current Allergies (reviewed today): ! CODEINE ! SULFA ! Quita Skye   Family History:    Father dec 75 Emphysema One lung     Mother dec 93 Heart Thyrroid Dz     Brother A 58 Unknown    Brother A 66 Osteo Hyperthyr    Brother A 62 BACK Probs    Sister A 60 Breast Cysts Fibro Back Probs Hypothyr    no esophageal cancer    No known head and neck cancers.   Risk Factors:  Caffeine use:  5+ drinks per day    Type:  beer 4 per week    Counseled to quit/cut down alcohol use:  no   Review of Systems  General      Complains of fatigue.      Denies chills, fever, loss of appetite, malaise, sleep disorder, sweats, weakness, and weight loss.      all the time   Joined weight watchers 1101  Eyes      Denies blurring, discharge, double vision, eye irritation, eye pain, halos, itching, light sensitivity, red eye, vision loss-1 eye, and vision loss-both eyes.  ENT      Denies decreased hearing, difficulty swallowing, ear discharge, earache, hoarseness, nasal congestion, nosebleeds, postnasal drainage, ringing in ears, sinus pressure, and sore throat.  CV      Complains of  palpitations.      Denies bluish discoloration of lips or nails, chest pain or discomfort, difficulty breathing at night, difficulty breathing while lying down, fainting, fatigue, leg cramps with exertion, lightheadness, near fainting, shortness of breath with exertion, swelling of feet, swelling of hands, and weight gain.      flutters at times, worse with Stress.  Resp      Complains of cough and shortness of breath.      chromnic  GI      Denies abdominal pain, bloody stools, change in bowel habits, constipation, dark tarry stools, diarrhea, excessive appetite, gas, hemorrhoids, indigestion, loss of appetite, nausea, vomiting, vomiting blood, and yellowish skin color.      ventral hernia bothering her.  GU      Complains of nocturia.      Denies abnormal vaginal bleeding, decreased libido, discharge, dysuria, genital sores, hematuria, incontinence, urinary frequency, and urinary hesitancy.  Neuro      Complains of poor balance.      toes are numb    Physical Exam  General:  Well-developed,well-nourished,in no acute distress; alert,appropriate and cooperative throughout examination, mildly obese. Head:     Normocephalic and atraumatic without obvious abnormalities. No apparent alopecia or balding. Eyes:     Conjunctiva clear bilaterally.  Ears:     External ear exam shows no significant lesions or deformities.  Otoscopic examination reveals clear canals, tympanic membranes are intact bilaterally without bulging, retraction, inflammation or discharge. Hearing is grossly normal bilaterally. Nose:     External nasal examination shows no deformity or inflammation. Nasal mucosa are pink and moist without lesions or exudates. Mouth:     Oral mucosa and oropharynx without lesions or exudates.  Teeth in good repair. Neck:     No deformities, masses, or tenderness noted. Chest Wall:     No deformities, masses, or tenderness noted. Breasts:     not done Lungs:     Normal  respiratory effort, chest expands symmetrically. Lungs are clear to auscultation, no crackles or wheezes. Heart:     Normal rate and regular rhythm. S1 and S2 normal without gallop, murmur, click, rub or other extra sounds. Abdomen:     Bowel sounds positive,abdomen soft and non-tender without masses, organomegaly or hernias noted. Rectal:     not done Genitalia:     not done Msk:     No deformity or scoliosis noted of thoracic or lumbar spine.   Pulses:     R and L carotid,radial,femoral,dorsalis pedis and posterior tibial pulses are full and equal bilaterally Extremities:     No clubbing, cyanosis, edema, or deformity noted with normal full range of motion of all joints.   Neurologic:     No cranial nerve deficits noted. Station and gait are normal. Plantar reflexes are down-going bilaterally. DTRs are symmetrical throughout. Sensory, motor and coordinative functions appear intact. Skin:     Intact without suspicious lesions or rashes Cervical Nodes:     No lymphadenopathy noted Inguinal Nodes:     No significant adenopathy Psych:     Cognition and judgment appear intact. Alert and cooperative with normal attention span and concentration. No apparent delusions, illusions, hallucinations    Impression & Recommendations:  Problem # 1:  HOARSENESS (ICD-784.49) Assessment: Unchanged Is going to see ENT foe vocal cord exam.  Problem # 2:  ESOPHAGEAL STENOSIS (ICD-530.3) Assessment: Improved Recently dilated. We discussed this and conservative measures to prolong needing repeat. This was her second dilation.  Problem # 3:  EDEMA (ICD-782.3) Assessment: Improved Adequate today. Discussed elevation of the legs, use of compression stockings, sodium restiction, and medication use.   Problem # 4:  SOB (ICD-786.05) Assessment: Unchanged Maybe slightly improved since stopping smoking...cont as is. Knows when to seek attention.  Problem # 5:  OBESITY NOS (ICD-278.00) Assessment:  Unchanged Discuassed, needs tio exercise and apply good eating habits.  Problem # 6:  NUMBNESS (ICD-782.0) Assessment: Unchanged Feet...know nothing to be done at this point...her mother had the same problem.  Problem # 7:  DIABETES MELLITUS, TYPE II (ICD-250.00) Assessment: Improved Old labs. Doesn't check her sugar...will check in the future. Labs Reviewed: HgBA1c: 6.0 (01/12/2007)   Creat: 1.2 (11/28/2007)   Microalbumin: 1.8 (01/12/2007)   Problem # 8:  HYPERLIPIDEMIA (ICD-272.4) Will repeat in the future. Labs Reviewed: Chol: 234 (01/12/2007)   HDL: 50.3 (01/12/2007)   LDL: 174.7 (01/12/2007)   TG: 126 (01/12/2007) SGOT: 14 (01/12/2007)   SGPT: 18 (01/12/2007)   Problem # 9:  HYPERTENSION (ICD-401.9) Assessment: Unchanged Adequate. Her updated medication list for this problem includes:  Norvasc 5 Mg Tabs (Amlodipine besylate) .Marland Kitchen... 1 daily by mouth  BP today: 120/80 Prior BP: 128/82 (01/08/2008)  Labs Reviewed: Creat: 1.2 (11/28/2007) Chol: 234 (01/12/2007)   HDL: 50.3 (01/12/2007)   LDL: 174.7 (01/12/2007)   TG: 126 (01/12/2007)   Problem # 10:  HYPOTHYROIDISM (ICD-244.9) Assessment: Unchanged Euthyroid on current dose. Her updated medication list for this problem includes:    Synthroid 137 Mcg Tabs (Levothyroxine sodium) .Marland Kitchen... 1 daily by mouth  Labs Reviewed: TSH: 2.90 (10/11/2007)    HgBA1c: 6.0 (01/12/2007) Chol: 234 (01/12/2007)   HDL: 50.3 (01/12/2007)   LDL: 174.7 (01/12/2007)   TG: 126 (01/12/2007)   Problem # 11:  DEPRESSION (ICD-311) Assessment: Unchanged Stabvle and recognizes need to continue. Her updated medication list for this problem includes:    Prozac 10 Mg Caps (Fluoxetine hcl) .Marland Kitchen... 1 daily by mouth    Wellbutrin Sr 150 Mg Tb12 (Bupropion hcl) .Marland Kitchen... 1 daily by mouth    Alprazolam 0.25 Mg Tabs (Alprazolam) .Marland Kitchen... 1 q6 hrs prn   Complete Medication List: 1)  Prozac 10 Mg Caps (Fluoxetine hcl) .Marland Kitchen.. 1 daily by mouth 2)  Wellbutrin Sr 150  Mg Tb12 (Bupropion hcl) .Marland Kitchen.. 1 daily by mouth 3)  Norvasc 5 Mg Tabs (Amlodipine besylate) .Marland Kitchen.. 1 daily by mouth 4)  Spiriva Handihaler 18 Mcg Caps (Tiotropium bromide monohydrate) .... Inhale 1 capsule by mouth once a day 5)  Nexium 40 Mg Cpdr (Esomeprazole magnesium) .Marland Kitchen.. 1 daily by mouth 6)  Synthroid 137 Mcg Tabs (Levothyroxine sodium) .Marland Kitchen.. 1 daily by mouth 7)  Advair Diskus 250-50 Mcg/dose Misc (Fluticasone-salmeterol) .... Inhale 1 puff twice a day 8)  Alprazolam 0.25 Mg Tabs (Alprazolam) .Marland Kitchen.. 1 q6 hrs prn 9)  Flonase 50 Mcg/act Susp (Fluticasone propionate) .Marland Kitchen.. 1 sp ea nostril every day 10)  Proair Hfa 108 (90 Base) Mcg/act Aers (Albuterol sulfate) .Marland Kitchen.. 1 puff as needed up to every 4 hours   Patient Instructions: 1)  RTC 6 mos, labs prior sooner as needed.   ]

## 2010-04-09 NOTE — Miscellaneous (Signed)
   Clinical Lists Changes  Observations: Added new observation of TSH: 0.34 microintl units/mL (02/19/2010 17:02) Added new observation of T4, FREE: 1.10 ng/dL (04/54/0981 19:14) Added new observation of HGBA1C: 6.3 % (02/19/2010 17:02) Added new observation of CREATININE: 1.0 mg/dL (78/29/5621 30:86) Added new observation of BUN: 19 mg/dL (57/84/6962 95:28) Added new observation of BG RANDOM: 121 mg/dL (41/32/4401 02:72) Added new observation of LDL DIR: 45 mg/dL (53/66/4403 47:42) Added new observation of LDL: 45 mg/dL (59/56/3875 64:33) Added new observation of HDL: 66 mg/dL (29/51/8841 66:06) Added new observation of TRIGLYC TOT: 77 mg/dL (30/16/0109 32:35) Added new observation of CHOLESTEROL: 126 mg/dL (57/32/2025 42:70)

## 2010-04-09 NOTE — Letter (Signed)
Summary: [facility]  [facility]   Imported By: Beau Fanny 10/24/2008 14:43:22  _____________________________________________________________________  External Attachment:    Type:   Image     Comment:   External Document

## 2010-04-09 NOTE — Miscellaneous (Signed)
Summary: Orders Update/CT/EGD  Clinical Lists Changes  Problems: Added new problem of ESOPHAGEAL STENOSIS (ICD-530.3) Orders: Added new Test order of ZEGD Balloon Dil (ZEGD Balloon) - Signed Added new Test order of GI Misc Procedure/ Radiology Order (GI Misc ) - Signed

## 2010-04-09 NOTE — Miscellaneous (Signed)
Summary: [facility] Previsit/prep  Clinical Lists Changes  Allergies: Changed allergy or adverse reaction from SULFA to SULFA - Signed

## 2010-04-09 NOTE — Assessment & Plan Note (Signed)
Summary: 1 m f/u  dlo  Medications Added PROZAC 10 MG CAPS (FLUOXETINE HCL) 1 daily by mouth WELLBUTRIN SR 150 MG TB12 (BUPROPION HCL) 1 daily by mouth NORVASC 5 MG TABS (AMLODIPINE BESYLATE) 1 daily by mouth NEXIUM 40 MG CPDR (ESOMEPRAZOLE MAGNESIUM) 1 daily by mouth SYNTHROID 137 MCG TABS (LEVOTHYROXINE SODIUM) 1 daily by mouth        Vital Signs:  Patient Profile:   75 Years Old Female Weight:      223 pounds O2 Sat:      96 % O2 treatment:    Room Air Temp:     96.6 degrees F tympanic Pulse rate:   76 / minute Pulse rhythm:   regular BP sitting:   120 / 70  (left arm) Cuff size:   large  Vitals Entered By: Providence Crosby (May 23, 2007 11:04 AM)                 Chief Complaint:  followup.  History of Present Illness: Here for followup, had trouble with Biaxin...it caught in her throat and then caused problems with mouth blisters which are resolving as she stopped med after seven days and finshed the Prednisone. Going up and down steps causes SOB and has     Prior Medications Reviewed Using: Patient Recall  Current Allergies (reviewed today): ! CODEINE ! SULFA ! Quita Skye      Physical Exam  General:     Well-developed,well-nourished,in no acute distress; alert,appropriate and cooperative throughout examination, comfortable and breathing well. Head:     Normocephalic and atraumatic without obvious abnormalities. No apparent alopecia or balding. Eyes:     Conjunctiva clear bilaterally.  Ears:     External ear exam shows no significant lesions or deformities.  Otoscopic examination reveals clear canals, tympanic membranes are intact bilaterally without bulging, retraction, inflammation or discharge. Hearing is grossly normal bilaterally. Nose:     External nasal examination shows no deformity or inflammation. Nasal mucosa are pink and moist without lesions or exudates. Mouth:     Oral mucosa and oropharynx without lesions or exudates.  Teeth in good  repair. Neck:     No deformities, masses, or tenderness noted. Chest Wall:     No deformities, masses, or tenderness noted. Breasts:     No mass, nodules, thickening, tenderness, bulging, retraction, inflamation, nipple discharge or skin changes noted.   Lungs:     Normal respiratory effort, chest expands symmetrically. Lungs are clear to auscultation, no crackles or wheezes. Heart:     Normal rate and regular rhythm. S1 and S2 normal without gallop, murmur, click, rub or other extra sounds.    Impression & Recommendations:  Problem # 1:  BRONCHITIS-ACUTE (ICD-466.0) Assessment: Improved Resolved. The following medications were removed from the medication list:    Zithromax Tri-pak 500 Mg Tabs (Azithromycin) .Marland Kitchen... As dir    Biaxin 500 Mg Tabs (Clarithromycin) .Marland Kitchen... 0ne tab by mouth bid  Her updated medication list for this problem includes:    Spiriva Handihaler 18 Mcg Caps (Tiotropium bromide monohydrate) ..... Inhale 1 capsule by mouth once a day    Advair Diskus 250-50 Mcg/dose Misc (Fluticasone-salmeterol) ..... Inhale 1 puff twice a day    Albuterol 90 Mcg/act Aers (Albuterol) ..... Inhale 1 puff every four to six hours   Problem # 2:  SOB (ICD-786.05) Assessment: New Refer to Cardiology for eval. Tried to impress upon pt how important it is to have this evaluated....sounds anginal to me. Orders: Cardiology Referral (  Cardiology)   Problem # 3:  BRONCHITIS, OBSTRUCTIVE CHRONIC W/EXACRB (ICD-491.21) Assessment: Improved Stay on Spiriva and Advair...see me after Cardiology eval to decrease Advair.  Problem # 4:  OBESITY NOS (ICD-278.00) Assessment: Unchanged Encouraged to lose.  Problem # 5:  DIABETES MELLITUS, TYPE II (ICD-250.00) Assessment: Unchanged Discussed quickly. Labs Reviewed: HgBA1c: 6.0 (01/12/2007)   Creat: 1.0 (01/12/2007)      Problem # 6:  HYPERTENSION (ICD-401.9) Assessment: Unchanged Stable. Her updated medication list for this problem  includes:    Norvasc 5 Mg Tabs (Amlodipine besylate) .Marland Kitchen... 1 daily by mouth  BP today: 120/70 Prior BP: 130/70 (05/11/2007)  Labs Reviewed: Creat: 1.0 (01/12/2007) Chol: 234 (01/12/2007)   HDL: 50.3 (01/12/2007)   LDL: DEL (01/12/2007)   TG: 126 (01/12/2007)   Complete Medication List: 1)  Prozac 10 Mg Caps (Fluoxetine hcl) .Marland Kitchen.. 1 daily by mouth 2)  Wellbutrin Sr 150 Mg Tb12 (Bupropion hcl) .Marland Kitchen.. 1 daily by mouth 3)  Norvasc 5 Mg Tabs (Amlodipine besylate) .Marland Kitchen.. 1 daily by mouth 4)  Spiriva Handihaler 18 Mcg Caps (Tiotropium bromide monohydrate) .... Inhale 1 capsule by mouth once a day 5)  Nexium 40 Mg Cpdr (Esomeprazole magnesium) .Marland Kitchen.. 1 daily by mouth 6)  Synthroid 137 Mcg Tabs (Levothyroxine sodium) .Marland Kitchen.. 1 daily by mouth 7)  Advair Diskus 250-50 Mcg/dose Misc (Fluticasone-salmeterol) .... Inhale 1 puff twice a day 8)  Albuterol 90 Mcg/act Aers (Albuterol) .... Inhale 1 puff every four to six hours 9)  Alprazolam 0.25 Mg Tabs (Alprazolam) .Marland Kitchen.. 1 q6 hrs prn 10)  Allegra 60 Mg Tabs (Fexofenadine hcl) .... One tab by mouth two times a day prn   Patient Instructions: 1)  Refer to Cardiol for eval.    ]

## 2010-04-09 NOTE — Assessment & Plan Note (Signed)
Summary: WALK IN  Medications Added BIAXIN 500 MG  TABS (CLARITHROMYCIN) 0ne tab by mouth bid PREDNISONE 20 MG  TABS (PREDNISONE) 2 tabs by mouth qAM for three days        Vital Signs:  Patient Profile:   75 Years Old Female Weight:      226 pounds O2 Sat:      97 % O2 treatment:    Room Air Temp:     98.4 degrees F oral Pulse rate:   82 / minute Pulse rhythm:   regular BP sitting:   130 / 70  (right arm) Cuff size:   large  Vitals Entered By: Providence Crosby (May 11, 2007 8:08 AM)                 Chief Complaint:  multiple symptoms.  History of Present Illness: Pt here as walkin first thing in the morning for congestion and dyspnea with raspy sore throat which started one month ago and improved on immed release guaif, flonase and tyl and finally improved by last week. Her sxs have since recurred with congestion last night making laying down impossible due to diff breathing, soreness and fatigue. She also complained of sinus sensitivity and faxed these comments to our office altho I never recieved it. She denies fever, N/V but is able with signif effort to get up thick green drainage yesterday now mildly tinged with blood. She has again used episodic flonase , has taken allegra and tyl.  She feels miserable andf looks like she hasn't slept all night. She has been out of her Advair(250/50) for over two weeks.    Prior Medications Reviewed Using: Patient Recall  Current Allergies (reviewed today): ! CODEINE ! SULFA      Physical Exam  General:     Well-developed,well-nourished,in no acute distress; alert,appropriate and cooperative throughout examination Head:     Normocephalic and atraumatic without obvious abnormalities. No apparent alopecia or balding. Sinuses tender in max distrib. Eyes:     Conjunctiva clear bilaterally.  Ears:     External ear exam shows no significant lesions or deformities.  Otoscopic examination reveals clear canals, tympanic membranes  are intact bilaterally without bulging, retraction, inflammation or discharge. Hearing is grossly normal bilaterally. Nose:     mildly inflamed, L>R with clear but thick discharge. Mouth:     Oral mucosa and oropharynx without lesions or exudates.  Teeth in good repair. thick, clear PND. Neck:     No deformities, masses, or tenderness noted. Chest Wall:     No deformities, masses, or tenderness noted. Breasts:     No mass, nodules, thickening, tenderness, bulging, retraction, inflamation, nipple discharge or skin changes noted.   Lungs:     Min wheezes with good air flow, mild ronchi. Heart:     Normal rate and regular rhythm. S1 and S2 normal without gallop, murmur, click, rub or other extra sounds.    Impression & Recommendations:  Problem # 1:  BRONCHITIS-ACUTE (ICD-466.0) Assessment: New Get back on Advair, take steroids, Biaxin and regular, not episodic, Flonase. Her updated medication list for this problem includes:    Spiriva Handihaler 18 Mcg Caps (Tiotropium bromide monohydrate) ..... Inhale 1 capsule by mouth once a day    Advair Diskus 250-50 Mcg/dose Misc (Fluticasone-salmeterol) ..... Inhale 1 puff twice a day    Albuterol 90 Mcg/act Aers (Albuterol) ..... Inhale 1 puff every four to six hours    Zithromax Tri-pak 500 Mg Tabs (Azithromycin) .Marland Kitchen... As dir  Biaxin 500 Mg Tabs (Clarithromycin) .Marland Kitchen... 0ne tab by mouth bid  Take antibiotics and other medications as directed. Encouraged to push clear liquids, get enough rest, and take acetaminophen as needed. To be seen in 5-7 days if no improvement, sooner if worse.   Problem # 2:  ALLERGY (ICD-995.3) Assessment: Deteriorated Discussed at length.  Problem # 3:  HYPERTENSION (ICD-401.9) Assessment: Unchanged Stable. Her updated medication list for this problem includes:    Norvasc 5 Mg Tabs (Amlodipine besylate)  BP today: 130/70 Prior BP: 130/70 (01/18/2007)  Labs Reviewed: Creat: 1.0 (01/12/2007) Chol: 234  (01/12/2007)   HDL: 50.3 (01/12/2007)   LDL: DEL (01/12/2007)   TG: 126 (01/12/2007)   Complete Medication List: 1)  Prozac 10 Mg Caps (Fluoxetine hcl) 2)  Wellbutrin Sr 150 Mg Tb12 (Bupropion hcl) 3)  Norvasc 5 Mg Tabs (Amlodipine besylate) 4)  Spiriva Handihaler 18 Mcg Caps (Tiotropium bromide monohydrate) .... Inhale 1 capsule by mouth once a day 5)  Nexium 40 Mg Cpdr (Esomeprazole magnesium) 6)  Synthroid 137 Mcg Tabs (Levothyroxine sodium) 7)  Advair Diskus 250-50 Mcg/dose Misc (Fluticasone-salmeterol) .... Inhale 1 puff twice a day 8)  Albuterol 90 Mcg/act Aers (Albuterol) .... Inhale 1 puff every four to six hours 9)  Alprazolam 0.25 Mg Tabs (Alprazolam) .Marland Kitchen.. 1 q6 hrs prn 10)  Zithromax Tri-pak 500 Mg Tabs (Azithromycin) .... As dir 11)  Allegra 60 Mg Tabs (Fexofenadine hcl) .... One tab by mouth two times a day prn 12)  Biaxin 500 Mg Tabs (Clarithromycin) .... 0ne tab by mouth bid 13)  Prednisone 20 Mg Tabs (Prednisone) .... 2 tabs by mouth qam for three days   Patient Instructions: 1)  Guaifenesen, pills or liquid, 600mg  by mouth AM and NOON for the next 4-5 days. 2)  Tylenol ES two tabs by mouth four times a day as needed. 3)  Push fluids. Gargle. 4)  GUAIFENESIN  600mg  by mouth AM and NOON   5)    ROBITUSSIN PLAIN (NO LETTERS, NO NAMES) two tablespoons  or 6)    [facility] MUCOUS RELIEF EXPECTORANT (400 mg) 11/2 TABS       7)  RTC 2 weeks. 8)  spent with pt.                 Prescriptions: PREDNISONE 20 MG  TABS (PREDNISONE) 2 tabs by mouth qAM for three days  #6 x 0   Entered and Authorized by:   Shaune Leeks MD   Signed by:   Shaune Leeks MD on 05/11/2007   Method used:   Print then Give to Patient   RxID:   [id number] BIAXIN 500 MG  TABS (CLARITHROMYCIN) 0ne tab by mouth bid  #20 x 0   Entered and Authorized by:   Shaune Leeks MD   Signed by:   Shaune Leeks MD on 05/11/2007   Method used:   Print then  Give to Patient   RxID:   [id number] ALBUTEROL 90 MCG/ACT AERS (ALBUTEROL) Inhale 1 puff every four to six hours  #1 x 2   Entered by:   Providence Crosby   Authorized by:   Shaune Leeks MD   Signed by:   Providence Crosby on 05/11/2007   Method used:   Print then Give to Patient   RxID:   [id number] ADVAIR DISKUS 250-50 MCG/DOSE MISC (FLUTICASONE-SALMETEROL) Inhale 1 puff twice a day  #60 x 6   Entered by:   Burna Mortimer  Combs   Authorized by:   Shaune Leeks MD   Signed by:   Providence Crosby on 05/11/2007   Method used:   Print then Give to Patient   RxID:   [id number]  ]

## 2010-04-09 NOTE — Assessment & Plan Note (Signed)
Summary: Cardiology Nuclear Testing  Nuclear Med Background Indications for Stress Test: Evaluation for Ischemia   History: Abnormal EKG, COPD, Echo, Myocardial Perfusion Study  History Comments: 03/09 MPS NL EF 65% 02/05/10 ECHO EF 50-55%  Symptoms: Chest Pain, Chest Pain with Exertion, Chest Tightness, DOE    Nuclear Pre-Procedure Cardiac Risk Factors: Carotid Disease, Claudication, Family History - CAD, History of Smoking, Hypertension, Lipids, NIDDM, Obesity, PVD Caffeine/Decaff Intake: None NPO After: 7:30 PM Lungs: clear IV 0.9% NS with Angio Cath: 22g     IV Site: R Antecubital IV Started by: Bonnita Levan, RN Chest Size (in) 44     Cup Size D     Height (in): 65 Weight (lb): 226 BMI: 37.74  Nuclear Med Study 1 or 2 day study:  1 day     Stress Test Type:  Lexiscan Reading MD:  Cassell Clement, MD     Referring MD:  T.Gollan Resting Radionuclide:  Technetium 72m Tetrofosmin     Resting Radionuclide Dose:  11.0 mCi  Stress Radionuclide:  Technetium 35m Tetrofosmin     Stress Radionuclide Dose:  33.0 mCi   Stress Protocol  Max Systolic BP: 158 mm Hg Lexiscan: 0.4 mg   Stress Test Technologist:  Milana Na, EMT-P     Nuclear Technologist:  Doyne Keel, CNMT  Rest Procedure  Myocardial perfusion imaging was performed at rest 45 minutes following the intravenous administration of Technetium 29m Tetrofosmin.  Stress Procedure  The patient received IV Lexiscan 0.4 mg over 15-seconds.  Technetium 44m Tetrofosmin injected at 30-seconds.  There were no significant changes with infusion.  Quantitative spect images were obtained after a 45 minute delay.  QPS Raw Data Images:  Normal; no motion artifact; normal heart/lung ratio. Stress Images:  Normal homogeneous uptake in all areas of the myocardium. Rest Images:  Normal homogeneous uptake in all areas of the myocardium. Subtraction (SDS):  No evidence of ischemia. Transient Ischemic Dilatation:  1.05  (Normal  <1.22)  Lung/Heart Ratio:  0.24  (Normal <0.45)  Quantitative Gated Spect Images QGS EDV:  65 ml QGS ESV:  15 ml QGS EF:  77 % QGS cine images:  No wall motion abnormalities.  Findings Normal nuclear study Clinically Abnormal (chest pain, ST abnormality, hypotension)      Overall Impression  Exercise Capacity: Lexiscan with no exercise. BP Response: Normal blood pressure response. Clinical Symptoms: Mild chest pain/dyspnea. ECG Impression: No significant ST segment change suggestive of ischemia. Overall Impression: Normal stress nuclear study.  Appended Document: Cardiology Nuclear Testing copy sent to DR.Gollan

## 2010-04-09 NOTE — Assessment & Plan Note (Signed)
Summary: TRANSFER FROM [facility]   Vital Signs:  Patient profile:   75 year old female Height:      64 inches Weight:      228.75 pounds BMI:     39.41 Temp:     98.3 degrees F oral Pulse rate:   92 / minute Pulse rhythm:   regular BP sitting:   152 / 70  (left arm) Cuff size:   large  Vitals Entered By: Delilah Shan CMA  Dull) (September 11, 2009 3:04 PM) CC: Transfer from [facility]   History of Present Illness: "I'm not happy."   Cdiff after hernia surgery last December.  "I never got back to where I was."  Less active.  More reclusive.  Has been on prozac for 20 years.  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  "I didn't have anything to replace our business and my family; I worked hard on both."  Family stress with limited contact with grandchildren.  No SI/HI.   Allergies: 1)  ! Codeine 2)  ! Sulfa 3)  ! * Biaxin  Social History: married, 2 children, she is retired, former smoker, drinks 2 alcoholic beverages per week, drinks 4 caffeinated beverages per day.  Enjoys painting.    Review of Systems       See HPI.  Otherwise noncontributory.    Physical Exam  General:  GEN: nad, alert and oriented, tearful HEENT: mucous membranes moist NECK: supple w/o LA CV: rrr.   murmur noted PULM: ctab, no wheeze   Impression & Recommendations:  Problem # 1:  DEPRESSION (ICD-311) Will increase to 20 mg of prozac a day and notify if not better in about 6 weeks.  Okay to use prev tabs (10 mg each).  No SI/HI.  Will continue to try to communicate with son and grandchildren.   Her updated medication list for this problem includes:    Prozac 10 Mg Caps (Fluoxetine hcl) .Marland Kitchen... 1 daily by mouth    Alprazolam 0.25 Mg Tabs (Alprazolam) .Marland Kitchen... Take one by mouth every 6 hours as needed  Complete Medication List: 1)  Prozac 10 Mg Caps (Fluoxetine hcl) .Marland Kitchen.. 1 daily by mouth 2)  Norvasc 5 Mg Tabs (Amlodipine besylate) .Marland Kitchen.. 1 daily by mouth 3)  Spiriva Handihaler 18  Mcg Caps (Tiotropium bromide monohydrate) .... Inhale 1 capsule by mouth once a day 4)  Nexium 40 Mg Cpdr (Esomeprazole magnesium) .Marland Kitchen.. 1 daily by mouth 5)  Advair Diskus 250-50 Mcg/dose Misc (Fluticasone-salmeterol) .... Inhale 1 puff twice a day 6)  Alprazolam 0.25 Mg Tabs (Alprazolam) .... Take one by mouth every 6 hours as needed 7)  Flonase 50 Mcg/act Susp (Fluticasone propionate) .Marland Kitchen.. 1 sp ea nostril every day 8)  Proair Hfa 108 (90 Base) Mcg/act Aers (Albuterol sulfate) .Marland Kitchen.. 1 puff as needed up to every 4 hours 9)  Fexofenadine Hcl 30 Mg Tabs (Fexofenadine hcl) .... Take 1 tablet by mouth once a day as needed 10)  Synthroid 150 Mcg Tabs (Levothyroxine sodium) .Marland Kitchen.. 1 daily by mouth 11)  Crestor 10 Mg Tabs (Rosuvastatin calcium) .... Take 1 tablet by mouth at bedtime  (takes 3 /week) 12)  Vitamin D 16109 Unit Caps (Ergocalciferol) .Marland Kitchen.. 1 tablet weekly by mouth 13)  Zetia 10 Mg Tabs (Ezetimibe) .... Take one by mouth daily 14)  Epipen 0.3 Mg/0.107ml Devi (Epinephrine) .... Use as directed as needed 15)  Coenzyme Q10 200 Mg Caps (Coenzyme q10) .... Take 1 tablet  by mouth once a day 16)  Januvia 50 Mg Tabs (Sitagliptin phosphate) .... One tab by mouth once daily 17)  Diovan 160 Mg Tabs (Valsartan) .... One tab by mouth once daily 18)  Synthroid 25 Mcg Tabs (Levothyroxine sodium) .... One tablet by mouth every other day 19)  Vitamin B-12 500 Mcg Tabs (Cyanocobalamin) .... 1,000 mg. once daily 20)  Lyrica 50 Mg Caps (Pregabalin) .... Take 1 capsule by mouth two times a day  Patient Instructions: 1)  Please schedule a follow-up appointment as needed.  Increase prozac to 2 pills a day.  Let me know if your need refills.    Current Allergies (reviewed today): ! CODEINE ! SULFA ! Quita Skye

## 2010-04-09 NOTE — Consult Note (Signed)
Summary: Endocrinology Consultation/Dr. Altheimer  Endocrinology Consultation/Dr. Altheimer   Imported By: Eleonore Chiquito 05/13/2008 15:24:26  _____________________________________________________________________  External Attachment:    Type:   Image     Comment:   External Document

## 2010-04-09 NOTE — Assessment & Plan Note (Signed)
Summary: BRONCITIAS   Vital Signs:  Patient Profile:   75 Years Old Female Height:     64.75 inches (164.47 cm) Weight:      236 pounds (107.27 kg) Temp:     97.7 degrees F (36.50 degrees C) oral Pulse rate:   80 / minute Pulse rhythm:   regular BP sitting:   140 / 80  (left arm) Cuff size:   regular  Vitals Entered By: Silas Sacramento (December 15, 2007 8:19 AM)                 Chief Complaint:  Bronchitis.  Acute Visit History:      The patient complains of cough, earache, eye symptoms, fever, nasal discharge, and sore throat.  She denies abdominal pain, chest pain, constipation, diarrhea, genitourinary symptoms, headache, musculoskeletal symptoms, nausea, rash, sinus problems, and vomiting.  Other comments include: eyes tired, chest, weak, sob, ears sensitive. throat dry nose sizzling.  Endoscopy yesterday Took some Allegra Flonase Took nothing else.  No fever Went to [place] [facility] last night sweaty and chills. quit three years, long  dtry cough.        The patient notes shortness of breath.  The character of the cough is described as nonproductive.  She has a history of COPD.  There is no history of wheezing, sleep interference, respiratory retractions, tachypnea, cyanosis, or interference with oral intake associated with her cough.        Urine output has been normal.          Current Allergies: ! CODEINE ! SULFA ! Quita Skye  Past Medical History:    Reviewed history from 11/07/2007 and no changes required:       Depression (03/08/1997)       Depression (12/07/1998)       Hypothyroidism (03/08/1968)       Hypertension (03/08/1992)       Hyperlipidemia (10/04/2000)       Diabetes mellitus, type II (02/05/2006)       COPD (03/08/1998)       Current Problems:        VENTRAL HERNIA (ICD-553.20)       ESOPHAGITIS (ICD-530.10)       EDEMA (ICD-782.3)       SOB (ICD-786.05)       ALLERGY (ICD-995.3)       BRONCHITIS, OBSTRUCTIVE CHRONIC W/EXACRB (ICD-491.21)   ROTATOR CUFF SYNDROME, RIGHT (ICD-726.10)       SYNDROME, ROTATOR CUFF NOS (ICD-726.10)       OBESITY NOS (ICD-278.00)       NUMBNESS (ICD-782.0)       POSTMENOPAUSAL ON HORMONE REPLACEMENT THERAPY (ICD-V07.4)       COPD (ICD-496)       DIABETES MELLITUS, TYPE II (ICD-250.00)       HYPERLIPIDEMIA (ICD-272.4)       HYPERTENSION (ICD-401.9)       HYPOTHYROIDISM (ICD-244.9)       DEPRESSION (ICD-311)         Past Surgical History:    Reviewed history from 07/03/2007 and no changes required:       Tonsillectomy       Rotator cuff repair R ([place]), 1984       C/S x 2 Breech/Repeat       Hysterectomy, total due to dysmennorhea, 1985        Cholecystectomy, 1997       Thumb release R, 12/1997       Carpal tunnel repair R 12/1997  EGD nml (due to hoarseness), 12/05/1997       Abd U/S nml x 2 foci in liver, 03/19/1999       CT Abd hemangiomas of liver  1cm R renal cyst       Cardiolite persantine nml, 08/24/2000       Carotid U/S 1-39% ICA stenosis, 08/24/2000       DEXA nml, 07/03/2003       Adenosine Myoview  nml 06/02/2007       Carotid U/S no apprec change  06/02/2007       Echo    06/02/2007     Review of Systems      See HPI  General      Complains of chills and sweats.      Denies fever.  Resp      Complains of cough and shortness of breath.      Denies sputum productive and wheezing.  GI      Denies diarrhea, nausea, and vomiting.   Physical Exam  Gen: Well-developed,well-nourished,in no acute distress; alert,appropriate and cooperative throughout examination  HEENT: Normocephalic and atraumatic. Throat clear, w/o exudate, no LAD, R TM clear, L TM - good landmarks, No fluid present. rhinnorhea.  Neck: No ant or post LAD.  CV: RRR, No M/G/R  Pulm: Breathing comfortably in no respiratory distress. some decreased breath sounds, no acute wheezing or crackles, or rhonchi   Abd: S,NT,ND,+BS  Ext: No clubbing, cyanosis, or edema      Impression &  Recommendations:  Problem # 1:  BRONCHITIS-ACUTE (ICD-466.0) Assessment: New Please see the patient instructions for a detailed list of plans and what was discussed with the patient.   ABX, inhalers as needed  Her updated medication list for this problem includes:    Spiriva Handihaler 18 Mcg Caps (Tiotropium bromide monohydrate) ..... Inhale 1 capsule by mouth once a day    Advair Diskus 250-50 Mcg/dose Misc (Fluticasone-salmeterol) ..... Inhale 1 puff twice a day    Proair Hfa 108 (90 Base) Mcg/act Aers (Albuterol sulfate) .Marland Kitchen... 1 puff as needed up to every 4 hours    Azithromycin 250 Mg Tabs (Azithromycin) .Marland Kitchen... 2 by  mouth today and then 1 daily for 4 days   Complete Medication List: 1)  Prozac 10 Mg Caps (Fluoxetine hcl) .Marland Kitchen.. 1 daily by mouth 2)  Wellbutrin Sr 150 Mg Tb12 (Bupropion hcl) .Marland Kitchen.. 1 daily by mouth 3)  Norvasc 5 Mg Tabs (Amlodipine besylate) .Marland Kitchen.. 1 daily by mouth 4)  Spiriva Handihaler 18 Mcg Caps (Tiotropium bromide monohydrate) .... Inhale 1 capsule by mouth once a day 5)  Nexium 40 Mg Cpdr (Esomeprazole magnesium) .Marland Kitchen.. 1 daily by mouth 6)  Synthroid 137 Mcg Tabs (Levothyroxine sodium) .Marland Kitchen.. 1 daily by mouth 7)  Advair Diskus 250-50 Mcg/dose Misc (Fluticasone-salmeterol) .... Inhale 1 puff twice a day 8)  Alprazolam 0.25 Mg Tabs (Alprazolam) .Marland Kitchen.. 1 q6 hrs prn 9)  Allegra 60 Mg Tabs (Fexofenadine hcl) .... One tab by mouth two times a day prn 10)  Flonase 50 Mcg/act Susp (Fluticasone propionate) .Marland Kitchen.. 1 sp ea nostril every day 11)  Proair Hfa 108 (90 Base) Mcg/act Aers (Albuterol sulfate) .Marland Kitchen.. 1 puff as needed up to every 4 hours 12)  Valium 5 Mg Tabs (Diazepam) .... Take to ct appt for anxiety and have driver with you 13)  Azithromycin 250 Mg Tabs (Azithromycin) .... 2 by  mouth today and then 1 daily for 4 days   Patient Instructions: 1)  BRONCHITIS 2)  -  Viral or baterial infections of the lung. Fever, cough, chest pain, shortness of breath, phlegm production,  fatigue are symptoms. 3)  Treatment: 4)  1. Take all medicines 5)  2. Antibiotics  6)  4. Bronchodilators: an inhaler 7)  5. Expectorant like Guaifenesin (Robitussin, Mucinex) 8)  Tylenol if aches or fever 9)  Fluids and Moisture help: drink lots of fluids 10)  Vaporizier or humidifier in room, shower steam 11)  --help loosen secretions and sooth breathing passages 12)  Elevate head slightly when trying to sleep.    Prescriptions: AZITHROMYCIN 250 MG  TABS (AZITHROMYCIN) 2 by  mouth today and then 1 daily for 4 days  #6 x 0   Entered and Authorized by:   Hannah Beat MD   Signed by:   Hannah Beat MD on 12/15/2007   Method used:   [facility]        [facility]  [address])       [address]       [place], [place]  [address]       Ph: [phone] or [phone]       Fax: [phone]   RxID:   [id number]  ]

## 2010-04-09 NOTE — Progress Notes (Signed)
Summary: What do you want her to do NOW???   Phone Note Call from Patient Call back at [facility] Phone [phone]   Caller: Patient Call For: Dr. Milinda Antis Summary of Call: Pt called yesterday (see yesterdays note) did what she was advised. Now her mouth is better but her tongue is numb. Not only that but now she wants to know what you are going to give her as a substitute for the antibiotic you took her off of. After all you are supposed to finish all your medication. Please advise. Initial call taken by: Mickle Asper,  May 19, 2007 2:11 PM  Follow-up for Phone Call        the mouth sensation should return to normal on its own  Occ people get better without using the full course of antiibiotic Since  she had a problem with the first one, I would hold off on any other meds now Call back next week  if she feels her infection seems to be returning Follow-up by: Cindee Salt MD,  May 19, 2007 2:49 PM  Additional Follow-up for Phone Call Additional follow up Details #1::        phoned pt with instructions Additional Follow-up by: Wandra Mannan,  May 19, 2007 3:07 PM

## 2010-04-09 NOTE — Progress Notes (Signed)
Summary: Pt requests phone call  Phone Note Call from Patient Call back at Home Phone [phone]   Caller: Patient Call For: Shaune Leeks MD Summary of Call: Pt states she has decided to go to [facility] and she would like for you to call her to discuss this. Initial call taken by: Lowella Petties,  June 04, 2008 3:18 PM  Follow-up for Phone Call        She'll fasx the long list of forms this program at [facility] requests. Follow-up by: Shaune Leeks MD,  June 04, 2008 7:08 PM      Appended Document: Orders Update     Clinical Lists Changes  Orders: Added new Referral order of Cardiology Referral (Cardiology) - Signed  Please refer to Cardiology (Pt's request) for ETT for program at [facility] for diabetes and obesity. Shaune Leeks MD  June 10, 2008 9:10 AM

## 2010-04-09 NOTE — Assessment & Plan Note (Signed)
Summary: SICK/DLO  WALKED IN THIS MORNING   Vital Signs:  Patient Profile:   75 Years Old Female Height:     64.50 inches (164.47 cm) Weight:      234.50 pounds BMI:     39.77 Temp:     98.3 degrees F Pulse rate:   80 / minute BP sitting:   140 / 72  Vitals Entered By: Delilah Shan (April 24, 2008 11:19 AM)                 PCP:  Luretha Rued  Chief Complaint:  sick.  History of Present Illness: In 02/2008 had bronchitis... improved than went to [place] 04/16/2008 seen in ER in [place]  Dx with COPD exacerbation CXR labs , strep negative. Given azithromycin, prednisone 5 day taper and nebulizer. Finished prednisone 2 days ago. Initially felt better then 2-3 days ago symptoms returned.   Returned from [place] last night... still SOB with exertion, worse in cold Continued coughing, noproductive  Using albuterol frequently, but trying not to Eyes swollen, watering blisters on tounge, throat sore No fever      Current Allergies: ! CODEINE ! SULFA ! Quita Skye  Past Medical History:    Reviewed history from 11/07/2007 and no changes required:       Depression (03/08/1997)       Depression (12/07/1998)       Hypothyroidism (03/08/1968)       Hypertension (03/08/1992)       Hyperlipidemia (10/04/2000)       Diabetes mellitus, type II (02/05/2006)       COPD (03/08/1998)       Current Problems:        VENTRAL HERNIA (ICD-553.20)       ESOPHAGITIS (ICD-530.10)       EDEMA (ICD-782.3)       SOB (ICD-786.05)       ALLERGY (ICD-995.3)       BRONCHITIS, OBSTRUCTIVE CHRONIC W/EXACRB (ICD-491.21)       ROTATOR CUFF SYNDROME, RIGHT (ICD-726.10)       SYNDROME, ROTATOR CUFF NOS (ICD-726.10)       OBESITY NOS (ICD-278.00)       NUMBNESS (ICD-782.0)       POSTMENOPAUSAL ON HORMONE REPLACEMENT THERAPY (ICD-V07.4)       COPD (ICD-496)       DIABETES MELLITUS, TYPE II (ICD-250.00)       HYPERLIPIDEMIA (ICD-272.4)       HYPERTENSION (ICD-401.9)       HYPOTHYROIDISM  (ICD-244.9)       DEPRESSION (ICD-311)          Social History:    Reviewed history from 11/08/2007 and no changes required:       married, 2 children, she is retired, former smoker, drinks 2 alcoholic beverages per week, drinks 4 caffeinated beverages per day.    Review of Systems      See HPI   Physical Exam  General:     Obese appearing female in NAd Ears:     External ear exam shows no significant lesions or deformities.  Otoscopic examination reveals clear canals, tympanic membranes are intact bilaterally without bulging, retraction, inflammation or discharge. Hearing is grossly normal bilaterally. Nose:     nasal discharge, mucosal pallor.   Mouth:     Oral mucosa and oropharynx without lesions or exudates.  Teeth in good repair. Neck:     no carotid bruit or thyromegaly no cervical or supraclavicular lymphadenopathy  Lungs:  decreased air movement throughout, wheezes in B anterior lung fields.  Heart:     Normal rate and regular rhythm. S1 and S2 normal without gallop, murmur, click, rub or other extra sounds. Pulses:     R and L posterior tibial pulses are full and equal bilaterally  Cervical Nodes:     No lymphadenopathy noted    Impression & Recommendations:  Problem # 1:  BRONCHITIS, OBSTRUCTIVE CHRONIC W/EXACRB (ICD-491.21) Likely allergy trigger. No clear ongoing bacterial infecton. S/P antibiotics. Treat with slower taper of prednisone. On adequate COPD treatment with Spiriva and Advair.   Problem # 2:  ALLERGY (ICD-995.3) Add back fexofenadine (pt has at home, low dose0 and continue flonase.   Complete Medication List: 1)  Prozac 10 Mg Caps (Fluoxetine hcl) .Marland Kitchen.. 1 daily by mouth 2)  Wellbutrin Sr 150 Mg Tb12 (Bupropion hcl) .Marland Kitchen.. 1 daily by mouth 3)  Norvasc 5 Mg Tabs (Amlodipine besylate) .Marland Kitchen.. 1 daily by mouth 4)  Spiriva Handihaler 18 Mcg Caps (Tiotropium bromide monohydrate) .... Inhale 1 capsule by mouth once a day 5)  Nexium 40 Mg Cpdr  (Esomeprazole magnesium) .Marland Kitchen.. 1 daily by mouth 6)  Synthroid 137 Mcg Tabs (Levothyroxine sodium) .Marland Kitchen.. 1 daily by mouth 7)  Advair Diskus 250-50 Mcg/dose Misc (Fluticasone-salmeterol) .... Inhale 1 puff twice a day 8)  Alprazolam 0.25 Mg Tabs (Alprazolam) .Marland Kitchen.. 1 q6 hrs prn 9)  Flonase 50 Mcg/act Susp (Fluticasone propionate) .Marland Kitchen.. 1 sp ea nostril every day 10)  Proair Hfa 108 (90 Base) Mcg/act Aers (Albuterol sulfate) .Marland Kitchen.. 1 puff as needed up to every 4 hours 11)  Prednisone 20 Mg Tabs (Prednisone) .... 3 tabs by mouth daily x 5 days, then 2 tabs x5 days, then 1 tab x 5 days, then 1/2 tab x 5 days 12)  Fexofenadine Hcl 30 Mg Tabs (Fexofenadine hcl) .... Take 1 tablet by mouth once a day    Prescriptions: PREDNISONE 20 MG TABS (PREDNISONE) 3 tabs by mouth daily x 5 days, then 2 tabs x5 days, then 1 tab x 5 days, then 1/2 tab x 5 days  #35 x 0   Entered and Authorized by:   Kerby Nora MD   Signed by:   Kerby Nora MD on 04/24/2008   Method used:   [facility]        [facility]  [address] [facility])       [address]       [place], [place]  [address]       Ph: [phone] or [phone]       Fax: [phone]   RxID:   [id number]

## 2010-04-09 NOTE — Progress Notes (Signed)
Summary: refill request for proair  Phone Note Refill Request Message from:  Fax from Pharmacy  Refills Requested: Medication #1:  PROAIR HFA 108 (90 BASE) MCG/ACT  AERS 1 PUFF as needed up to every 4 hours Faxed form from [facility] is on your shelf.  Initial call taken by: Lowella Petties CMA,  January 09, 2009 9:03 AM    Prescriptions: PROAIR HFA 108 (90 BASE) MCG/ACT  AERS (ALBUTEROL SULFATE) 1 PUFF as needed up to every 4 hours  #3 MDI x 0   Entered and Authorized by:   Shaune Leeks MD   Signed by:   Shaune Leeks MD on 01/09/2009   Method used:   Electronically to        [facility] (mail-order)             ,          Ph: [phone]       Fax: [phone]   RxID:   [id number]

## 2010-04-09 NOTE — Progress Notes (Signed)
Summary: Rx Norvasc and Proair  Phone Note Refill Request Call back at [phone] Message from:  [facility] on April 22, 2009 8:28 AM  Refills Requested: Medication #1:  NORVASC 5 MG TABS 1 daily by mouth  Medication #2:  PROAIR HFA 108 (90 BASE) MCG/ACT  AERS 1 PUFF as needed up to every 4 hours   Dosage confirmed as above?Dosage Confirmed Received faxed refill request, please advise.  Forms in your IN box   Method Requested: Fax to Mail Away Pharmacy Initial call taken by: Linde Gillis CMA Duncan Dull),  April 22, 2009 8:29 AM    Prescriptions: PROAIR HFA 108 (90 BASE) MCG/ACT  AERS (ALBUTEROL SULFATE) 1 PUFF as needed up to every 4 hours  #3 MDI x 0   Entered and Authorized by:   Shaune Leeks MD   Signed by:   Shaune Leeks MD on 04/22/2009   Method used:   Electronically to        [facility]* (mail-order)             ,          Ph: [phone]       Fax: [phone]   RxID:   [id number] NORVASC 5 MG TABS (AMLODIPINE BESYLATE) 1 daily by mouth  #90 x 3   Entered and Authorized by:   Shaune Leeks MD   Signed by:   Shaune Leeks MD on 04/22/2009   Method used:   Electronically to        [facility]* (mail-order)             ,          Ph: [phone]       Fax: [phone]   RxID:   [id number]  Using a Proair inhaler regularly to need a new inhaler every month is using it too much. Please have pt come in to be seen. Shaune Leeks MD  April 22, 2009 8:33 AM

## 2010-04-09 NOTE — Assessment & Plan Note (Signed)
Summary: f/u EGD /pl    History of Present Illness Visit Type: follow up Primary GI MD: Rob Bunting MD Primary Provider: Luretha Rued Requesting Provider: Everrett Coombe, P.A. Chief Complaint: follow-up visit/EGD History of Present Illness:      EGD one month ago, no cervical esophageal stricture.  +minor lower esophageal narrowing dilated.  Had CT scan neck, chest..essentially negative. Had MBS and barium esophagram...suggested upper and lower esophageal narrowing.   she is very cautious about how she eats (less food in mouth at once, chews more, eat slowly, focusing on swallowing) has had no problems since then.  Does have intermittent horseness, gravelly voice.  This has been a problem for about a year.             Prior Medications Reviewed Using: Patient Recall  Updated Prior Medication List: PROZAC 10 MG CAPS (FLUOXETINE HCL) 1 daily by mouth WELLBUTRIN SR 150 MG TB12 (BUPROPION HCL) 1 daily by mouth NORVASC 5 MG TABS (AMLODIPINE BESYLATE) 1 daily by mouth SPIRIVA HANDIHALER 18 MCG CAPS (TIOTROPIUM BROMIDE MONOHYDRATE) Inhale 1 capsule by mouth once a day NEXIUM 40 MG CPDR (ESOMEPRAZOLE MAGNESIUM) 1 daily by mouth SYNTHROID 137 MCG TABS (LEVOTHYROXINE SODIUM) 1 daily by mouth ADVAIR DISKUS 250-50 MCG/DOSE MISC (FLUTICASONE-SALMETEROL) Inhale 1 puff twice a day ALPRAZOLAM 0.25 MG TABS (ALPRAZOLAM) 1 q6 hrs prn FLONASE 50 MCG/ACT  SUSP (FLUTICASONE PROPIONATE) 1 SP EA NOSTRIL EVERY DAY PROAIR HFA 108 (90 BASE) MCG/ACT  AERS (ALBUTEROL SULFATE) 1 PUFF as needed up to every 4 hours  Current Allergies (reviewed today): ! CODEINE ! SULFA ! * BIAXIN      Vital Signs:  Patient Profile:   75 Years Old Female Height:     64.75 inches (164.47 cm) Weight:      231.13 pounds BMI:     38.90 Pulse rate:   72 / minute Pulse rhythm:   regular BP sitting:   128 / 82  (left arm)  Vitals Entered By: June McMurray CMA (January 08, 2008 2:12 PM)                   Physical Exam  Constitutional: generally well appearing Psychiatric: alert and oriented times 3 Abdomen: soft, non-tender, non-distended, normal bowel sounds     Impression & Recommendations:  Problem # 1:  DYSPHAGIA (BJY-782.95) as long as she follows swallowing precautions advised by Speech, she has no choking.    I'm bothered by her horseness.  She is a long term smoker and has been intermittently hoarse for about 1 year.  Will refer to ENT.   Patient Instructions: 1)  A copy of this information will be sent to Dr. Hetty Ely. 2)  Will refer to ENT to evaluate your horseness. 3)  Continue your swallowing precautions. 4)  Call Dr. Christella Hartigan for food catching (dysphagia) after you swallow it.    ]  Appended Document: Orders Update/ENT    Clinical Lists Changes  Problems: Added new problem of HOARSENESS (AOZ-308.65) Orders: Added new Test order of [facility] ([facility]) - Signed

## 2010-04-09 NOTE — Letter (Signed)
Summary: Dr.Michael Altheimer,Endocrinology,Note  Dr.Michael Altheimer,Endocrinology,Note   Imported By: Beau Fanny 11/19/2008 16:44:18  _____________________________________________________________________  External Attachment:    Type:   Image     Comment:   External Document

## 2010-04-09 NOTE — Assessment & Plan Note (Signed)
Summary: CONGESTION EVERYWHERE  Medications Added PROZAC 10 MG CAPS (FLUOXETINE HCL)  WELLBUTRIN SR 150 MG TB12 (BUPROPION HCL)  NORVASC 5 MG TABS (AMLODIPINE BESYLATE)  SPIRIVA HANDIHALER 18 MCG CAPS (TIOTROPIUM BROMIDE MONOHYDRATE) Inhale 1 capsule by mouth once a day NEXIUM 40 MG CPDR (ESOMEPRAZOLE MAGNESIUM)  SYNTHROID 137 MCG TABS (LEVOTHYROXINE SODIUM)  ADVAIR DISKUS 250-50 MCG/DOSE MISC (FLUTICASONE-SALMETEROL) Inhale 1 puff twice a day ALBUTEROL 90 MCG/ACT AERS (ALBUTEROL) Inhale 1 puff every four to six hours ALPRAZOLAM 0.25 MG TABS (ALPRAZOLAM) 1 q6 hrs prn ZITHROMAX TRI-PAK 500 MG TABS (AZITHROMYCIN) as dir      Allergies Added: ! CODEINE ! SULFA  Vital Signs:  Patient Profile:   75 Years Old Female Weight:      199.12 pounds Temp:     97.5 degrees F tympanic Pulse rate:   72 / minute Pulse rhythm:   regular BP sitting:   120 / 80  (left arm)  Vitals Entered By: Providence Crosby (Jul 12, 2006 9:33 AM)               Chief Complaint:  uri symptoms/ c/o chest feeling tight and cough.  History of Present Illness: Nose congestion better, lower congestion of the chest with tightness of diaphragmatic area.  Cough nonproductive , tastes like cold   taking advil 2 three times a day yesterday.  no fever  Acute Visit History: URI HPI   In today for evaluation of symptoms consistent with URI. Onset of symptoms:   History of (Y,N) Cough: y Runny nose: n URI symptoms: Congestion n Fever: n Sinus Pressure: y Ears Blocked: n Teeth Ache: n Frontal Headache: min  PMH Sinusitis or Recurrent OM: not recently PMH Prior Sinus or Ear Surgery: n  Recent antibiotic usage (last 30 days) (Y/N): n PMH or Diabetes or Immunocompromised (Y/N): y DM Risk factors for Sinusitis: recent smoker Smoker/Passive smoke exposure (Y/N): y   URI HPI   In today for evaluation of symptoms consistent with URI. Onset of symptoms:   History of (Y,N) Cough:  Runny nose:  URI symptoms:   Irritability:  Fever:  Sinus Pressure:  Ears Blocked:  Teeth Ache:  Frontal Headache:   PMH Sinusitis or Recurrent OM:  PMH Prior Sinus or Ear Surgery:   Recent antibiotic usage (last 30 days) (Y/N):  PMH or Diabetes or Immunocompromised (Y/N):  Risk factors for Sinusitis:  Smoker/Passive smoke exposure (Y/N):       Current Allergies: ! CODEINE ! SULFA    Risk Factors:  Passive smoke exposure:  no Drug use:  no HIV high-risk behavior:  no Alcohol use:  yes    Drinks per day:  <1    Counseled to quit/cut down alcohol use:  no Exercise:  no Seatbelt use:  100 %    Physical Exam  General:     Well-developed,well-nourished,in no acute distress; alert,appropriate and cooperative throughout examination Head:     Normocephalic and atraumatic without obvious abnormalities. No apparent alopecia or balding. Ears:     External ear exam shows no significant lesions or deformities.  Otoscopic examination reveals clear canals, tympanic membranes are intact bilaterally without bulging, retraction, inflammation or discharge. Hearing is grossly normal bilaterally. Nose:     Mild congestion with clear discharge. Mouth:     Oral mucosa and oropharynx without lesions or exudates.  Teeth in good repair. Neck:     No deformities, masses, or tenderness noted. Chest Wall:     No deformities, masses, or tenderness  noted. Lungs:     Normal respiratory effort, chest expands symmetrically. Very faint wheezes bilat midlung fields bilat.  Incr E:I ratio. Heart:     Normal rate and regular rhythm. S1 and S2 normal without gallop, murmur, click, rub or other extra sounds. Extremities:     No clubbing, cyanosis, edema, or deformity noted with normal full range of motion of all joints.      Impression & Recommendations:  Problem # 1:  BRONCHITIS-ACUTE (ICD-466.0) Take Guaifenesin as directed. Her updated medication list for this problem includes:    Spiriva Handihaler 18 Mcg Caps  (Tiotropium bromide monohydrate) ..... Inhale 1 capsule by mouth once a day    Advair Diskus 250-50 Mcg/dose Misc (Fluticasone-salmeterol) ..... Inhale 1 puff twice a day    Albuterol 90 Mcg/act Aers (Albuterol) ..... Inhale 1 puff every four to six hours    Zithromax Tri-pak 500 Mg Tabs (Azithromycin) .Marland Kitchen... As dir   Problem # 2:  BRONCHITIS, OBSTRUCTIVE CHRONIC W/EXACRB (ICD-491.21) Assessment: Unchanged Stopping smoking will help but must give it some time and stay totally off cigarettes.  Problem # 3:  OBESITY NOS (ICD-278.00) weight loss imperative to get back to good health especially in light of just quitting smoking.  Problem # 4:  DIABETES MELLITUS, TYPE II (ICD-250.00) quickly discussed avoiding foods she knows she should not be eating.  Medications Added to Medication List This Visit: 1)  Prozac 10 Mg Caps (Fluoxetine hcl) 2)  Wellbutrin Sr 150 Mg Tb12 (Bupropion hcl) 3)  Norvasc 5 Mg Tabs (Amlodipine besylate) 4)  Spiriva Handihaler 18 Mcg Caps (Tiotropium bromide monohydrate) .... Inhale 1 capsule by mouth once a day 5)  Nexium 40 Mg Cpdr (Esomeprazole magnesium) 6)  Synthroid 137 Mcg Tabs (Levothyroxine sodium) 7)  Advair Diskus 250-50 Mcg/dose Misc (Fluticasone-salmeterol) .... Inhale 1 puff twice a day 8)  Albuterol 90 Mcg/act Aers (Albuterol) .... Inhale 1 puff every four to six hours 9)  Alprazolam 0.25 Mg Tabs (Alprazolam) .Marland Kitchen.. 1 q6 hrs prn 10)  Zithromax Tri-pak 500 Mg Tabs (Azithromycin) .... As dir   Patient Instructions: 1)  Zithromax 1 ZPak as directed 2)  Guaifenesin as dir 3)  Drink lots of fluids 4)  Stay off cigs!!! 5)  Please schedule a follow-up appointment as needed. 6)  It is important that you exercise regularly at least 20 minutes 5 times a week. If you develop chest pain, have severe difficulty breathing, or feel very tired , stop exercising immediately and seek medical attention. 7)  It is not healthy  for men to drink more than 2-3 drinks per  day or for women to drink more than 1-2 drinks per day.

## 2010-04-09 NOTE — Progress Notes (Signed)
Summary: xray ?  Phone Note Call from Patient Call back at [facility] Phone [phone]   Caller: Patient Call For: [place] Reason for Call: Talk to Nurse Summary of Call: pt wants to know what know what the comparison was from this last xray and the one before that?  She forgot to ask when she spoke with nurse earlier. Initial call taken by: Eugene Gavia,  September 17, 2009 4:55 PM  Follow-up for Phone Call        called pt and explained that the recent xray was  compared to the one done in 01/2009.   pt wanted to know about the cxr done in 2006.  she stated that she will just talk with MW at her next ov about any changes in the cxr since 2006 since she used to be a smoker. Randell Loop [facility]  September 17, 2009 5:04 PM

## 2010-04-09 NOTE — Progress Notes (Signed)
Summary: Rx Wellbutrin  Phone Note Refill Request Call back at [phone] Message from:  [facility] on March 17, 2009 9:45 AM  Refills Requested: Medication #1:  WELLBUTRIN SR 150 MG TB12 1 daily by mouth Received refill request from [facility].  Wellbutrin SR 150mg , #180.  Take one tablet twice daily   Method Requested: Fax to Mail Away Pharmacy Initial call taken by: Linde Gillis CMA Duncan Dull),  March 17, 2009 9:47 AM    Prescriptions: WELLBUTRIN SR 150 MG TB12 (BUPROPION HCL) 1 daily by mouth  #60 x 0   Entered and Authorized by:   Ruthe Mannan MD   Signed by:   Ruthe Mannan MD on 03/17/2009   Method used:   Electronically to        [facility]* (mail-order)             ,          Ph: [phone]       Fax: [phone]   RxID:   [id number]

## 2010-04-09 NOTE — Miscellaneous (Signed)
Summary: Orders Update  Clinical Lists Changes  Orders: Added new Referral order of Nuclear Stress Test (Nuc Stress Test) - Signed  Appended Document: Appointment Canceled please send to cardiology to help patient reschedule.

## 2010-04-09 NOTE — Progress Notes (Signed)
  Medications Added ALLEGRA 60 MG  TABS (FEXOFENADINE HCL) one tab by mouth two times a day prn       Phone Note Call from Patient   Caller: Patient Call For: dr. Hetty Ely Summary of Call: wants rx for allegra 30mg   [phone]// also want xanax .25 mg [facility] takes [facility] now and then. Initial call taken by: Providence Crosby,  April 19, 2007 9:47 AM    New/Updated Medications: ALLEGRA 60 MG  TABS (FEXOFENADINE HCL) one tab by mouth two times a day prn   Prescriptions: ALPRAZOLAM 0.25 MG TABS (ALPRAZOLAM) 1 q6 hrs prn  #30 x 0   Entered and Authorized by:   Shaune Leeks MD   Signed by:   Shaune Leeks MD on 04/19/2007   Method used:   Print then Give to Patient   RxID:   [id number] ALLEGRA 60 MG  TABS (FEXOFENADINE HCL) one tab by mouth two times a day prn  #180 x 5   Entered and Authorized by:   Shaune Leeks MD   Signed by:   Shaune Leeks MD on 04/19/2007   Method used:   Print then Give to Patient   RxID:   [id number]   ..................................................................Marland KitchenShaune Leeks MD  April 19, 2007 11:07 AM

## 2010-04-09 NOTE — Consult Note (Signed)
Summary: Dr.Matthew Dwain Sarna Note  Dr.Matthew Dwain Sarna Note   Imported By: Beau Fanny 04/10/2009 11:31:29  _____________________________________________________________________  External Attachment:    Type:   Image     Comment:   External Document

## 2010-04-09 NOTE — Progress Notes (Signed)
Summary: [facility] refill  Phone Note Refill Request Message from:  Fax from Pharmacy on March 20, 2008 10:02 AM  Refills Requested: Medication #1:  NORVASC 5 MG TABS 1 daily by mouth [facility] refill [phone]  Initial call taken by: Providence Crosby,  March 20, 2008 10:02 AM  Follow-up for Phone Call        prescribtion faxed to [facility] Follow-up by: Providence Crosby,  March 20, 2008 12:03 PM      Prescriptions: NORVASC 5 MG TABS (AMLODIPINE BESYLATE) 1 daily by mouth  #90 x 3   Entered and Authorized by:   Shaune Leeks MD   Signed by:   Shaune Leeks MD on 03/20/2008   Method used:   Printed then [facility] ...       [facility]  [address] [facility]       [address]       [place], [place]  [address]       Ph: [phone] or [phone]       Fax: [phone]   RxID:   [id number]

## 2010-04-09 NOTE — Assessment & Plan Note (Signed)
Summary: DISCUSS MEDICAL ISSUES/CLE   Vital Signs:  Patient profile:   75 year old female Height:      64 inches Weight:      227 pounds Temp:     97.7 degrees F oral Pulse rate:   76 / minute Pulse rhythm:   regular BP sitting:   140 / 70  (left arm) Cuff size:   large  Vitals Entered By: Providence Crosby (May 30, 2008 2:44 PM)  History of Present Illness: Pt here to discuss "situation". She is now  "motivated  to lose weight and get her diabetes under control." She has started an Academic librarian of diet control with meal planning expertise, online followup and personally tailered information based upon information she has supplied. She was told she needs to be on a 1300-1500 kcal diet, which she has been doing for two weeks and has already lost 7 pounds by our scales! I tried to encourage her yet emphasize the importance of continued effort and maintenance of routine. 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 She feels she can sustain her current diet and is now motivated to start regular exercise as well. She already feels significantly better and is thus motivated even more. After this discussion, she then announced that she wanted to go to a month-long inpatient program at [facility] that is very expensive "with no pressure or hard expectations" to get her life in order and her health back on track. This program requires significant records and testing prior to enrollment. I then tried to explain that if she truly made the committment above that she had just discussed, I personally see no reason to scuttle all of that and go to a program where she really has no ownership. My opinion is she will do better in the long run and feel better about herself succeeding on her present regimen....again if she is committed and stays the course for the long haul. She also discussed desire to get ventral hernia repaired. We discussed.   Allergies: 1)  !  Codeine 2)  ! Sulfa 3)  ! * Biaxin  Past History:  Past Medical History:    Depression (03/08/1997)    Depression (12/07/1998)    Hypothyroidism (03/08/1968)    Hypertension (03/08/1992)    Hyperlipidemia (10/04/2000)    Diabetes mellitus, type II (02/05/2006)    COPD (03/08/1998)    Current Problems:     VENTRAL HERNIA (ICD-553.20)    ESOPHAGITIS (ICD-530.10)    EDEMA (ICD-782.3)    SOB (ICD-786.05)    ALLERGY (ICD-995.3)    BRONCHITIS, OBSTRUCTIVE CHRONIC W/EXACRB (ICD-491.21)    ROTATOR CUFF SYNDROME, RIGHT (ICD-726.10)    SYNDROME, ROTATOR CUFF NOS (ICD-726.10)    OBESITY NOS (ICD-278.00)    NUMBNESS (ICD-782.0)    POSTMENOPAUSAL ON HORMONE REPLACEMENT THERAPY (ICD-V07.4)    COPD (ICD-496)    DIABETES MELLITUS, TYPE II (ICD-250.00)    HYPERLIPIDEMIA (ICD-272.4)    HYPERTENSION (ICD-401.9)    HYPOTHYROIDISM (ICD-244.9)    DEPRESSION (ICD-311)     (11/07/2007)  Past Surgical History:    Tonsillectomy    Rotator cuff repair R ([place]), 1984    C/S x 2 Breech/Repeat    Hysterectomy, total due to dysmennorhea, 1985     Cholecystectomy, 1997    Thumb release R, 12/1997    Carpal tunnel repair R 12/1997    EGD nml (due to hoarseness), 12/05/1997    Abd U/S  nml x 2 foci in liver, 03/19/1999    CT Abd hemangiomas of liver  1cm R renal cyst    Cardiolite persantine nml, 08/24/2000    Carotid U/S 1-39% ICA stenosis, 08/24/2000    DEXA nml, 07/03/2003    Adenosine Myoview  nml 06/02/2007    Carotid U/S no apprec change  06/02/2007    Echo    06/02/2007 (07/03/2007)  Social History:    married, 2 children, she is retired, former smoker, drinks 2 alcoholic beverages per week, drinks 4 caffeinated beverages per day. (11/08/2007)  Risk Factors:    Alcohol Use: <1 (07/12/2006)    >5 drinks/d w/in last 3 months: N/A    Caffeine Use: 5+ (01/18/2008)    Diet: N/A    Exercise: no (07/12/2006)  Risk Factors:    Smoking Status: quit (06/05/2006)    Packs/Day: N/A     Cigars/wk: N/A    Pipe Use/wk: N/A    Cans of tobacco/wk: N/A    Passive Smoke Exposure: no (07/12/2006)  Physical Exam  General:  Obese appearing female in NAD, looks good and is enthused. Head:  Normocephalic and atraumatic without obvious abnormalities. No apparent alopecia or balding. Eyes:  Conjunctiva clear bilaterally.  Ears:  External ear exam shows no significant lesions or deformities.  Otoscopic examination reveals clear canals, tympanic membranes are intact bilaterally without bulging, retraction, inflammation or discharge. Hearing is grossly normal bilaterally. Nose:  External nasal examination shows no deformity or inflammation. Nasal mucosa are pink and moist without lesions or exudates. Mouth:  Oral mucosa and oropharynx without lesions or exudates.  Teeth in good repair. Neck:  no carotid bruit or thyromegaly no cervical or supraclavicular lymphadenopathy  Chest Wall:  No deformities, masses, or tenderness noted. Lungs:  Normal respiratory effort, chest expands symmetrically. Lungs are clear to auscultation, no crackles or wheezes. Good air movement today. Heart:  Normal rate and regular rhythm. S1 and S2 normal without gallop, murmur, click, rub or other extra sounds. Abdomen:  Bowel sounds positive,abdomen soft and non-tender without masses, organomegaly or hernias noted.   Impression & Recommendations:  Problem # 1:  OBESITY NOS (ICD-278.00) Assessment Improved A long discussion was undertaken...again reiterated my belief she is better off in the current program assuming she remains committes and therefore successful. She can always go to the [facility] program if she so desires.  Problem # 2:  DIABETES MELLITUS, TYPE II (ICD-250.00) Assessment: Unchanged This will definitely improve as weight comes off. Her updated medication list for this problem includes:    Januvia 100 Mg Tabs (Sitagliptin phosphate) .Marland Kitchen... 1 daily by mouth  Problem # 3:  VENTRAL HERNIA (ICD-553.20)  Assessment: Unchanged Suggested she wait until more weight is off before pursuing repair...Marland Kitchenfeel certain the surgeons would say this also.  Problem # 4:  HYPERTENSION (ICD-401.9) Assessment: Unchanged Stable but will improve with continued weight loss. Her updated medication list for this problem includes:    Norvasc 5 Mg Tabs (Amlodipine besylate) .Marland Kitchen... 1 daily by mouth  BP today: 140/70 Prior BP: 140/72 (04/24/2008)  Labs Reviewed: K+: 4.6 (11/28/2007) Creat: : 1.2 (11/28/2007)   Chol: 234 (01/12/2007)   HDL: 50.3 (01/12/2007)   LDL: DEL (01/12/2007)   TG: 126 (01/12/2007)  Problem # 5:  SOB (ICD-786.05) Assessment: Improved Already improved with 7 pound weight loss but pt thinks she has sleep apnea. Her husband is successfully on CPAP and she understands the routine. Her weight definitely puts her at risk and her continued weight loss  is key but referral for eval is probably worthwhile. Orders: Dermatology Referral (Derma) Pulmonary Referral (Pulmonary)  Complete Medication List: 1)  Prozac 10 Mg Caps (Fluoxetine hcl) .Marland Kitchen.. 1 daily by mouth 2)  Wellbutrin Sr 150 Mg Tb12 (Bupropion hcl) .Marland Kitchen.. 1 daily by mouth 3)  Norvasc 5 Mg Tabs (Amlodipine besylate) .Marland Kitchen.. 1 daily by mouth 4)  Spiriva Handihaler 18 Mcg Caps (Tiotropium bromide monohydrate) .... Inhale 1 capsule by mouth once a day 5)  Nexium 40 Mg Cpdr (Esomeprazole magnesium) .Marland Kitchen.. 1 daily by mouth 6)  Advair Diskus 250-50 Mcg/dose Misc (Fluticasone-salmeterol) .... Inhale 1 puff twice a day 7)  Alprazolam 0.25 Mg Tabs (Alprazolam) .Marland Kitchen.. 1 q6 hrs prn 8)  Flonase 50 Mcg/act Susp (Fluticasone propionate) .Marland Kitchen.. 1 sp ea nostril every day 9)  Proair Hfa 108 (90 Base) Mcg/act Aers (Albuterol sulfate) .Marland Kitchen.. 1 puff as needed up to every 4 hours 10)  Prednisone 20 Mg Tabs (Prednisone) .... 3 tabs by mouth daily x 5 days, then 2 tabs x5 days, then 1 tab x 5 days, then 1/2 tab x 5 days 11)  Fexofenadine Hcl 30 Mg Tabs (Fexofenadine hcl) ....  Take 1 tablet by mouth once a day 12)  Synthroid 150 Mcg Tabs (Levothyroxine sodium) .Marland Kitchen.. 1 daily by mouth 13)  Januvia 100 Mg Tabs (Sitagliptin phosphate) .Marland Kitchen.. 1 daily by mouth 14)  Crestor 10 Mg Tabs (Rosuvastatin calcium) .Marland Kitchen.. 1 at bedtime by mouth 15)  Vitamin D 86578 Unit Caps (Ergocalciferol) .Marland Kitchen.. 1 tablet weekly by mouth  Other Orders: H1N1 vaccine G code (I6962) Influenza A (H1N1) adm  fee Medicare/Non Medicare [phone])  Patient Instructions: 1)  Shaking all the time....if still there in a few mos, will refer. 2)  RTC in 2 mos, sooner as needed,   Flu Vaccine Consent Questions    Do you have a history of severe allergic reactions to this vaccine? no    Any prior history of allergic reactions to egg and/or gelatin? no    Do you have a sensitivity to the preservative Thimersol? no    Do you have a past history of Guillan-Barre Syndrome? no    Do you currently have an acute febrile illness? no    Have you ever had a severe reaction to latex? no    Vaccine information given and explained to patient? yes    Are you currently pregnant? no   H1N1 # 1    Vaccine Type: H1N1 vaccine G code    Site: right deltoid    Mfr: NOVARTIS    Dose: 0.5 ml    Route: IM    Given by: Providence Crosby    Exp. Date: 07/05/2008    Lot #: [id number]    VIS given: 12/06/2008 given May 30, 2008.  Vaccine Consent Questions    Do you have a history of severe allergic reactions to this vaccine? no    Any prior history of allergic reactions to egg and/or gelatin? no    Do you currently have an acute febrile illness? no    Have you ever had a severe reaction to latex? no    Patient is moderately or severely ill? no    Vaccine information given and explained to patient? yes    Are you currently pregnant? no

## 2010-04-09 NOTE — Letter (Signed)
Summary: Results Letter   Gastroenterology  [address] [place], [place] [address]   Phone: [phone]  Fax: [phone]        November 29, 2007 MRN: [id number]    Alexandria [name] [address] [place], [place]  [address]    Dear Ms. Gamero,   Your recent blood tests were all normal.  Please continue with the recommendations we previously discussed and feel free to call if you have any further questions or concerns.       Sincerely,  Rachael Fee MD  This letter has been electronically signed by your physician.

## 2010-04-09 NOTE — Progress Notes (Signed)
Summary: Needs written rx  Phone Note Call from Patient Call back at cell [phone]   Caller: Patient Call For: Dr. Hetty Ely Summary of Call: Was given samples of Allegra 30mg ., took last sample today.  Going to [place] on Saturday, would like written rx. [facility]. Initial call taken by: Sydell Axon,  August 25, 2006 8:57 AM  Follow-up for Phone Call        Rx called to pharmacy.  Pt. notified by voicemail. Follow-up by: Sydell Axon,  August 25, 2006 12:03 PM  New Problems: ALLERGY (ICD-995.3) New/Updated Medications: ALLEGRA 180 MG  TABS (FEXOFENADINE HCL) one tab by mouth once daily as needed  New Problems: ALLERGY (ICD-995.3) New/Updated Medications: ALLEGRA 180 MG  TABS (FEXOFENADINE HCL) one tab by mouth once daily as needed  Prescriptions: ALLEGRA 180 MG  TABS (FEXOFENADINE HCL) one tab by mouth once daily as needed  #30 x prn    Entered and Authorized by:   Shaune Leeks MD   Signed by:   Shaune Leeks MD on 08/25/2006   Method used:   Print then Give to Patient   RxID:   [id number]

## 2010-04-09 NOTE — Assessment & Plan Note (Signed)
Summary: sleep consult ok per LC//jrc   Copy to:  Dr. Leslie Dales, Dr. Mariah Milling, Dr. Sherene Sires Primary Provider/Referring Provider:  Dr. Ferd Glassing  CC:  Sleep consult.Marland KitchenMarland KitchenEpworth score is 20.  History of Present Illness: 75 yowf quit smoking   Dec 2006 with GOLD II COPD  FEV1 1.42 (64%) in  2004.  Also mild OSA.  She is here for evaluation of sleep apnea.  She had her sleep test on November 22.  This showed an AHI of 13 with significant REM effect.  Her husband has sleep apnea, and she has similar symptoms to him.  She snores, and wakes up with a gasp.  Her sleep problems have been getting worse.  She goes to bed at 1115 pm.  She has trouble falling asleep because her breathing catches.  She has been sleeping on a wedge and this helps some.  She has also been sleeping in a recliner.  She wakes up every 2 hours to use the bathroom.  She gets out of bed at 9 am.  She feels tired, but does not get headaches.  She is not using anything to help her sleep.  She drinks coffee all day.  She had her tonsills removed as a child.  She uses to grind her teeth.  She does not dream much.  She denies sleep walking or talking.  There is no history of restless legs.  She denies sleep hallucinations, sleep paralysis, or cataplexy.  She is followed by endocrinology for her diabetes and thyroid.  She had lost about 30 lbs, and her sleep improved.  Unfortunately she gained all the weight back, and her sleep got worse.  She has been using her sister's CPAP machine since she had her sleep test.  This has helped quite a bit with her sleep and energy level during the day.  She has not had any problems tolerating this.  She is not sure what the pressure setting on the machine is.  Current Medications (verified): 1)  Prozac 10 Mg Caps (Fluoxetine Hcl) .Marland Kitchen.. 1 Two Times A Day 2)  Norvasc 5 Mg Tabs (Amlodipine Besylate) .Marland Kitchen.. 1 Daily By Mouth 3)  Nexium 40 Mg Cpdr (Esomeprazole Magnesium) .... Take  One 30-60 Min Before  First Meal of The Day 4)  Alprazolam 0.25 Mg Tabs (Alprazolam) .... Take One By Mouth Every 6 Hours As Needed 5)  Flonase 50 Mcg/act  Susp (Fluticasone Propionate) .Marland Kitchen.. 1 Sp Ea Nostril Every Day 6)  Proair Hfa 108 (90 Base) Mcg/act  Aers (Albuterol Sulfate) .Marland Kitchen.. 1 Puff As Needed Up To Every 4 Hours 7)  Fexofenadine Hcl 30 Mg Tabs (Fexofenadine Hcl) .... Take 1 Tablet By Mouth Once A Day As Needed 8)  Synthroid 150 Mcg Tabs (Levothyroxine Sodium) .Marland Kitchen.. 1 Daily By Mouth 9)  Crestor 10 Mg Tabs (Rosuvastatin Calcium) .Marland Kitchen.. 1 At Bedtime 10)  Vitamin D 04540 Unit Caps (Ergocalciferol) .Marland Kitchen.. 1 Tablet Weekly By Mouth 11)  Zetia 10 Mg Tabs (Ezetimibe) .... Take One By Mouth Daily 12)  Epipen 0.3 Mg/0.74ml Devi (Epinephrine) .... Use As Directed As Needed 13)  Coenzyme Q10 200 Mg Caps (Coenzyme Q10) .... Take 1 Tablet By Mouth Once A Day 14)  Januvia 50 Mg Tabs (Sitagliptin Phosphate) .... One Tab By Mouth Once Daily 15)  Diovan 160 Mg Tabs (Valsartan) .... One Tab By Mouth Once Daily 16)  Synthroid 25 Mcg Tabs (Levothyroxine Sodium) .... One Tablet By Mouth Every Other Day Out of This Medication 17)  Vitamin  B-12 500 Mcg  Tabs (Cyanocobalamin) .... 1,000 Mg. Once Daily 18)  Symbicort 160-4.5 Mcg/act  Aero (Budesonide-Formoterol Fumarate) .... 2 Puffs First Thing  in Am and 2 Puffs Again in Pm About 12 Hours Later 19)  Tylenol Extra Strength 500 Mg Tabs (Acetaminophen) .... Per Bottle Directions As Needed 20)  Spiriva Handihaler 18 Mcg  Caps (Tiotropium Bromide Monohydrate) .... Two Puffs in Handihaler Daily 21)  Pepcid 20 Mg Tabs (Famotidine) .... Take One By Mouth At Bedtime 22)  Nitrostat 0.4 Mg Subl (Nitroglycerin) .Marland Kitchen.. 1 Tablet Under Tongue At Onset of Chest Pain; You May Repeat Every 5 Minutes For Up To 3 Doses. 23)  Isosorbide Mononitrate Cr 30 Mg Xr24h-Tab (Isosorbide Mononitrate) .... 1/2 By Mouth Daily  Allergies (verified): 1)  ! Codeine 2)  ! Sulfa 3)  ! * Biaxin  Past History:  Past  Medical History: Depression (12/07/1998) Hypothyroidism (03/08/1968) Hypertension (03/08/1992) Hyperlipidemia (10/04/2000) Diabetes mellitus, type II (02/05/2006)- Dr. Leslie Dales with endo COPD (03/08/1998)    - PFT"s 12/12/2002  FEV1 1.42 (64%) ratio 58 with no better after B2 and DLCO75%    - PFT's  9/ 15/11    FEV1  1.50 (73%) ratio 50 no better after B2 with DLC0 62%    - hfa 50% November 20, 2009  > 75% January 13, 2010 p coaching Obstructive sleep apnea     - PSG 01/27/10 AHI 13 VENTRAL HERNIA (ICD-553.20) ESOPHAGITIS (ICD-530.10) ALLERGY (ICD-995.3) ROTATOR CUFF SYNDROME, RIGHT (ICD-726.10) OBESITY NOS (ICD-278.00)  Past Surgical History: Reviewed history from 01/26/2009 and no changes required. Tonsillectomy Rotator cuff repair R ([place]), 1984 C/S x 2 Breech/Repeat Hysterectomy, total due to dysmennorhea, 1985  Cholecystectomy, 1997 Thumb release R, 12/1997 Carpal tunnel repair R 12/1997 EGD nml (due to hoarseness), 12/05/1997 Abd U/S nml x 2 foci in liver, 03/19/1999 CT Abd hemangiomas of liver  1cm R renal cyst Cardiolite persantine nml, 08/24/2000 Carotid U/S 1-39% ICA stenosis, 08/24/2000 DEXA nml, 07/03/2003 Adenosine Myoview  nml 06/02/2007 Carotid U/S no apprec change  06/02/2007 Echo    06/02/2007 Lap Ventr Hernia Repair w/ Lysis of Adhesions (Dr Dwain Sarna) 01/24/2009  Family History: Reviewed history from 01/13/2010 and no changes required. Father dec 75 Emphysema One lung  Mother dec 93 Heart Thyrroid Dz  Brother A 23 Unknown Brother A 66 Osteo Hyperthyr Brother A 62 BACK Probs Sister A 60 Breast Cysts Fibro Back Probs Hypothyr no esophageal cancer No known head and neck cancers. no premature ascvd  Social History: Reviewed history from 09/16/2009 and no changes required. married, 2 children, she is retired, former smoker, drinks 2 alcoholic beverages per week, drinks 4 caffeinated beverages per day.  Enjoys painting.   Former smoker.  Quit on  July 9th 2011.  She smoked 40 yrs up to 1 ppd  Review of Systems       The patient complains of shortness of breath with activity, chest pain, acid heartburn, indigestion, weight change, difficulty swallowing, nasal congestion/difficulty breathing through nose, anxiety, and joint stiffness or pain.  The patient denies shortness of breath at rest, productive cough, non-productive cough, coughing up blood, irregular heartbeats, loss of appetite, abdominal pain, sore throat, tooth/dental problems, headaches, sneezing, itching, ear ache, depression, hand/feet swelling, rash, change in color of mucus, and fever.    Vital Signs:  Patient profile:   75 year old female Height:      65 inches (165.10 cm) Weight:      231.50 pounds (105.23 kg) BMI:  38.66 O2 Sat:      95 % on Room air Temp:     97.7 degrees F (36.50 degrees C) oral Pulse rate:   93 / minute BP sitting:   114 / 64  (left arm) Cuff size:   large  Vitals Entered By: Michel Bickers CMA (February 09, 2010 10:47 AM)  O2 Sat at Rest %:  95 O2 Flow:  Room air  Physical Exam  General:  normal appearance, healthy appearing, and obese.   Eyes:  PERRLA and EOMI.   Ears:  TMs intact and clear with normal canals Nose:  no deformity, discharge, inflammation, or lesions Mouth:  MP 2, wears dentures, no exudate Neck:  no JVD.   Lungs:  clear bilaterally to auscultation and percussion Heart:  regular rate and rhythm, S1, S2 without murmurs, rubs, gallops, or clicks Abdomen:  bowel sounds positive; abdomen soft and non-tender without masses, or organomegaly Extremities:  no clubbing, cyanosis, edema, or deformity noted Neurologic:  normal CN II-XII and strength normal.   Cervical Nodes:  no significant adenopathy Psych:  alert and cooperative; normal mood and affect; normal attention span and concentration   Impression & Recommendations:  Problem # 1:  OBSTRUCTIVE SLEEP APNEA (ICD-327.23) She has mild sleep apnea.  She has a history of  cardiovascular disease and diabetes.  I reviewed her sleep test with her.  Explained how sleep apnea can affect her health.  Driving precautions and importance of weight loss were discussed.  Treatment options were reviewed.  Will proceed with auto-CPAP titration at home.  If this is unsuccessful, she will need an in-lab titration.  She has an extra CPAP machine that her sister was not using, and would like to keep this as a spare in her second home.  Will have her DME adjust the pressure on her spare machine also.  Medications Added to Medication List This Visit: 1)  Isosorbide Mononitrate Cr 30 Mg Xr24h-tab (Isosorbide mononitrate) .... 1/2 by mouth daily 2)  Synthroid 25 Mcg Tabs (Levothyroxine sodium) .... One tablet by mouth every other day out of this medication  Complete Medication List: 1)  Prozac 10 Mg Caps (Fluoxetine hcl) .Marland Kitchen.. 1 two times a day 2)  Alprazolam 0.25 Mg Tabs (Alprazolam) .... Take one by mouth every 6 hours as needed 3)  Isosorbide Mononitrate Cr 30 Mg Xr24h-tab (Isosorbide mononitrate) .... 1/2 by mouth daily 4)  Diovan 160 Mg Tabs (Valsartan) .... One tab by mouth once daily 5)  Norvasc 5 Mg Tabs (Amlodipine besylate) .Marland Kitchen.. 1 daily by mouth 6)  Crestor 10 Mg Tabs (Rosuvastatin calcium) .Marland Kitchen.. 1 at bedtime 7)  Zetia 10 Mg Tabs (Ezetimibe) .... Take one by mouth daily 8)  Coenzyme Q10 200 Mg Caps (Coenzyme q10) .... Take 1 tablet by mouth once a day 9)  Pepcid 20 Mg Tabs (Famotidine) .... Take one by mouth at bedtime 10)  Nexium 40 Mg Cpdr (Esomeprazole magnesium) .... Take  one 30-60 min before first meal of the day 11)  Flonase 50 Mcg/act Susp (Fluticasone propionate) .Marland Kitchen.. 1 sp ea nostril every day 12)  Fexofenadine Hcl 30 Mg Tabs (Fexofenadine hcl) .... Take 1 tablet by mouth once a day as needed 13)  Symbicort 160-4.5 Mcg/act Aero (Budesonide-formoterol fumarate) .... 2 puffs first thing  in am and 2 puffs again in pm about 12 hours later 14)  Spiriva Handihaler 18  Mcg Caps (Tiotropium bromide monohydrate) .... Two puffs in handihaler daily 15)  Proair Hfa 108 (90  Base) Mcg/act Aers (Albuterol sulfate) .Marland Kitchen.. 1 puff as needed up to every 4 hours 16)  Januvia 50 Mg Tabs (Sitagliptin phosphate) .... One tab by mouth once daily 17)  Synthroid 150 Mcg Tabs (Levothyroxine sodium) .Marland Kitchen.. 1 daily by mouth 18)  Synthroid 25 Mcg Tabs (Levothyroxine sodium) .... One tablet by mouth every other day out of this medication 19)  Vitamin D 66440 Unit Caps (Ergocalciferol) .Marland Kitchen.. 1 tablet weekly by mouth 20)  Vitamin B-12 500 Mcg Tabs (Cyanocobalamin) .... 1,000 mg. once daily 21)  Tylenol Extra Strength 500 Mg Tabs (Acetaminophen) .... Per bottle directions as needed 22)  Nitrostat 0.4 Mg Subl (Nitroglycerin) .Marland Kitchen.. 1 tablet under tongue at onset of chest pain; you may repeat every 5 minutes for up to 3 doses. 23)  Epipen 0.3 Mg/0.68ml Devi (Epinephrine) .... Use as directed as needed  Other Orders: Est. Patient Level V (34742) DME Referral (DME)  Patient Instructions: 1)  Will set up CPAP machine 2)  Follow up in 6 to 8 weeks

## 2010-04-09 NOTE — Consult Note (Signed)
Summary: [facility]  [facility]   Imported By: Maryln Gottron 02/20/2010 14:07:10  _____________________________________________________________________  External Attachment:    Type:   Image     Comment:   External Document  Appended Document: [facility]     Clinical Lists Changes  Problems: Changed problem from DIABETES MELLITUS, TYPE II (ICD-250.00) to DIABETES MELLITUS, TYPE II, WITH NEUROLOGICAL COMPLICATIONS (ICD-250.60) Observations: Added new observation of PAST MED HX: Depression (12/07/1998) Hypothyroidism (03/08/1968) Hypertension (03/08/1992) Hyperlipidemia (10/04/2000) Diabetes mellitus, type II (02/05/2006)- Dr. Leslie Dales with endo COPD (03/08/1998)    - PFT"s 12/12/2002  FEV1 1.42 (64%) ratio 58 with no better after B2 and DLCO75%    - PFT's  9/ 15/11    FEV1  1.50 (73%) ratio 50 no better after B2 with DLC0 62%    - hfa 50% November 20, 2009  > 75% January 13, 2010 p coaching Obstructive sleep apnea     - PSG 01/27/10 AHI 13 VENTRAL HERNIA (ICD-553.20) ESOPHAGITIS (ICD-530.10) ALLERGY (ICD-995.3) ROTATOR CUFF SYNDROME, RIGHT (ICD-726.10) OBESITY NOS (ICD-278.00) Peripheral neuropathy, likley due to DM per Dr. Sandria Manly (02/22/2010 12:51)       Past History:  Past Medical History: Depression (12/07/1998) Hypothyroidism (03/08/1968) Hypertension (03/08/1992) Hyperlipidemia (10/04/2000) Diabetes mellitus, type II (02/05/2006)- Dr. Leslie Dales with endo COPD (03/08/1998)    - PFT"s 12/12/2002  FEV1 1.42 (64%) ratio 58 with no better after B2 and DLCO75%    - PFT's  9/ 15/11    FEV1  1.50 (73%) ratio 50 no better after B2 with DLC0 62%    - hfa 50% November 20, 2009  > 75% January 13, 2010 p coaching Obstructive sleep apnea     - PSG 01/27/10 AHI 13 VENTRAL HERNIA (ICD-553.20) ESOPHAGITIS (ICD-530.10) ALLERGY (ICD-995.3) ROTATOR CUFF SYNDROME, RIGHT (ICD-726.10) OBESITY NOS  (ICD-278.00) Peripheral neuropathy, likley due to DM per Dr. Sandria Manly   Allergies: 1)  ! Codeine 2)  ! Sulfa 3)  ! * Biaxin 4)  ! * Decongestants

## 2010-04-09 NOTE — Progress Notes (Signed)
   Phone Note Outgoing Call   Call placed by: Chales Abrahams CMA,  November 14, 2007 10:35 AM Summary of Call: left message on machine to call back  Follow-up for Phone Call        pt aware Follow-up by: Chales Abrahams CMA,  November 14, 2007 4:11 PM

## 2010-04-09 NOTE — Letter (Signed)
Summary: Results Follow up Letter  [place] [facility]  [address] [place], [place] [address]   Phone: [phone]  Fax: [phone]    10/31/2008 MRN: [id number]  Alexandria [name] [address] [place], [place]  [address]  Dear Ms. Wolken,  The following are the results of your recent test(s):  Test         Result    Pap Smear:        Normal _____  Not Normal _____ Comments: ______________________________________________________ Cholesterol: LDL(Bad cholesterol):         Your goal is less than:         HDL (Good cholesterol):       Your goal is more than: Comments:  ______________________________________________________ Mammogram:        Normal _____  Not Normal _____ Comments:  ___________________________________________________________________ Hemoccult:        Normal _____  Not normal _______ Comments:    _____________________________________________________________________ Other Tests:   Your bone density test shows your bone strength has decreased a little since your bone scan of 2005. Still does not require medication. Please continue medications as they are and exercise as much as possible. Enclosed is a copy of your report.    We routinely do not discuss normal results over the telephone.  If you desire a copy of the results, or you have any questions about this information we can discuss them at your next office visit.   Sincerely,

## 2010-04-09 NOTE — Assessment & Plan Note (Signed)
Summary: CHEST CONGESTION/DLO   Vital Signs:  Patient profile:   75 year old female Height:      64 inches Weight:      209 pounds BMI:     36.00 Temp:     97.7 degrees F oral Pulse rate:   84 / minute Pulse rhythm:   regular BP sitting:   116 / 84  (left arm) Cuff size:   large  Vitals Entered By: Delilah Shan (August 29, 2008 12:25 PM) CC: Chest congestion   History of Present Illness: Pt here , now back from [facility] with lots of energy and feeling well.  Today she has lots of congestion and has used guaif chest congestion once this AM nd feels palpitations.  She has palpitations occas but is very full of energy at this point, eating healthily and losing weight, very enthused about finding "the new her." Her main complaint is congestion she can't move, feeling as if she needs to get something up. She described in great detail the time she spent at [facility] in her program and all the hingas she learned about diet. The most important thing though was finding herself,  being independent and making new friends. She has maintained a 1200 cal diet and feels well, no sense of starving herself.  She drinks significant amt of coffee.  Problems Prior to Update: 1)  Need Prophylactic Vaccination&inoculation Flu  (ICD-V04.81) 2)  Hoarseness  (ICD-784.49) 3)  Esophageal Stenosis  (ICD-530.3) 4)  Choking  (ICD-933.1) 5)  Dysphagia  (ICD-787.29) 6)  Ventral Hernia  (ICD-553.20) 7)  Esophagitis  (ICD-530.10) 8)  Edema  (ICD-782.3) 9)  Sob  (ICD-786.05) 10)  Allergy  (ICD-995.3) 11)  Bronchitis, Obstructive Chronic W/exacrb  (ICD-491.21) 12)  Rotator Cuff Syndrome, Right  (ICD-726.10) 13)  Syndrome, Rotator Cuff Nos  (ICD-726.10) 14)  Obesity Nos  (ICD-278.00) 15)  Numbness  (ICD-782.0) 16)  Postmenopausal On Hormone Replacement Therapy  (ICD-V07.4) 17)  COPD  (ICD-496) 18)  Diabetes Mellitus, Type II  (ICD-250.00) 19)  Hyperlipidemia  (ICD-272.4) 20)  Hypertension  (ICD-401.9) 21)   Hypothyroidism  (ICD-244.9) 22)  Depression  (ICD-311)  Medications Prior to Update: 1)  Prozac 10 Mg Caps (Fluoxetine Hcl) .Marland Kitchen.. 1 Daily By Mouth 2)  Wellbutrin Sr 150 Mg Tb12 (Bupropion Hcl) .Marland Kitchen.. 1 Daily By Mouth 3)  Norvasc 5 Mg Tabs (Amlodipine Besylate) .Marland Kitchen.. 1 Daily By Mouth 4)  Spiriva Handihaler 18 Mcg Caps (Tiotropium Bromide Monohydrate) .... Inhale 1 Capsule By Mouth Once A Day 5)  Nexium 40 Mg Cpdr (Esomeprazole Magnesium) .Marland Kitchen.. 1 Daily By Mouth 6)  Advair Diskus 250-50 Mcg/dose Misc (Fluticasone-Salmeterol) .... Inhale 1 Puff Twice A Day 7)  Alprazolam 0.25 Mg Tabs (Alprazolam) .Marland Kitchen.. 1 Q6 Hrs Prn 8)  Flonase 50 Mcg/act  Susp (Fluticasone Propionate) .Marland Kitchen.. 1 Sp Ea Nostril Every Day 9)  Proair Hfa 108 (90 Base) Mcg/act  Aers (Albuterol Sulfate) .Marland Kitchen.. 1 Puff As Needed Up To Every 4 Hours 10)  Prednisone 20 Mg Tabs (Prednisone) .... 3 Tabs By Mouth Daily X 5 Days, Then 2 Tabs X5 Days, Then 1 Tab X 5 Days, Then 1/2 Tab X 5 Days 11)  Fexofenadine Hcl 30 Mg Tabs (Fexofenadine Hcl) .... Take 1 Tablet By Mouth Once A Day 12)  Synthroid 150 Mcg Tabs (Levothyroxine Sodium) .Marland Kitchen.. 1 Daily By Mouth 13)  Januvia 100 Mg Tabs (Sitagliptin Phosphate) .Marland Kitchen.. 1 Daily By Mouth 14)  Crestor 10 Mg Tabs (Rosuvastatin Calcium) .Marland Kitchen.. 1 At Bedtime By  Mouth 15)  Vitamin D 04540 Unit Caps (Ergocalciferol) .Marland Kitchen.. 1 Tablet Weekly By Mouth  Allergies: 1)  ! Codeine 2)  ! Sulfa 3)  ! * Biaxin  Physical Exam  General:  Obese appearing female in NAD, looks good and is enthused. Ha slimmed down some. Head:  Normocephalic and atraumatic without obvious abnormalities. No apparent alopecia or balding. Eyes:  Conjunctiva clear bilaterally.  Ears:  External ear exam shows no significant lesions or deformities.  Otoscopic examination reveals clear canals, tympanic membranes are intact bilaterally without bulging, retraction, inflammation or discharge. Hearing is grossly normal bilaterally. Nose:  External nasal examination  shows no deformity or inflammation. Nasal mucosa are pink and moist without lesions or exudates. Mouth:  Oral mucosa and oropharynx without lesions or exudates.  Teeth in good repair. Neck:  no carotid bruit or thyromegaly no cervical or supraclavicular lymphadenopathy  Chest Wall:  No deformities, masses, or tenderness noted. Lungs:  Normal respiratory effort, chest expands symmetrically. Lungs are clear to auscultation, no crackles or wheezes. Good air movement today. Heart:  Normal rate and regular rhythm. S1 and S2 normal without gallop, murmur, click, rub or other extra sounds. Abdomen:  Bowel sounds positive,abdomen soft and non-tender without masses, organomegaly or hernias noted.   Impression & Recommendations:  Problem # 1:  BRONCHITIS, OBSTRUCTIVE CHRONIC W/EXACRB (ICD-491.21) Assessment Unchanged Agqain discussed proper use of  Guaifenesin. Take  by going to [facility], [facility] or [facility] and getting MUCOUS RELIEF EXPECTORANT (400mg ), take 11/2 tabs by mouth AM and NOON. Drink lots of fluids anytime taking Guaifenesin.    Problem # 2:  PALPITATIONS (ICD-785.1) Assessment: New Discussed decreasing caffeine.  Complete Medication List: 1)  Prozac 10 Mg Caps (Fluoxetine hcl) .Marland Kitchen.. 1 daily by mouth 2)  Wellbutrin Sr 150 Mg Tb12 (Bupropion hcl) .Marland Kitchen.. 1 daily by mouth 3)  Norvasc 5 Mg Tabs (Amlodipine besylate) .Marland Kitchen.. 1 daily by mouth 4)  Spiriva Handihaler 18 Mcg Caps (Tiotropium bromide monohydrate) .... Inhale 1 capsule by mouth once a day 5)  Nexium 40 Mg Cpdr (Esomeprazole magnesium) .Marland Kitchen.. 1 daily by mouth 6)  Advair Diskus 250-50 Mcg/dose Misc (Fluticasone-salmeterol) .... Inhale 1 puff twice a day 7)  Alprazolam 0.25 Mg Tabs (Alprazolam) .Marland Kitchen.. 1 q6 hrs prn 8)  Flonase 50 Mcg/act Susp (Fluticasone propionate) .Marland Kitchen.. 1 sp ea nostril every day 9)  Proair Hfa 108 (90 Base) Mcg/act Aers (Albuterol sulfate) .Marland Kitchen.. 1 puff as needed up to every 4 hours 10)  Fexofenadine Hcl 30 Mg Tabs  (Fexofenadine hcl) .... Take 1 tablet by mouth once a day as needed 11)  Synthroid 150 Mcg Tabs (Levothyroxine sodium) .Marland Kitchen.. 1 daily by mouth 12)  Crestor 10 Mg Tabs (Rosuvastatin calcium) .... Take 1 tablet by mouth at bedtime  (takes 3 /week) 13)  Vitamin D 98119 Unit Caps (Ergocalciferol) .Marland Kitchen.. 1 tablet weekly by mouth 14)  Q-10 Co-enzyme 30 Mg Caps (Coenzyme q10) .... Take 1 tablet by mouth once a day  Patient Instructions: 1)  RTC if sxs cont. 2)  35 mins spent with pt.  Current Allergies (reviewed today): ! CODEINE ! SULFA ! Quita Skye

## 2010-04-09 NOTE — Progress Notes (Signed)
Summary: Rx Proair  Phone Note Refill Request Message from:  [facility] on November 05, 2009 10:16 AM  Refills Requested: Medication #1:  PROAIR HFA 108 (90 BASE) MCG/ACT  AERS 1 PUFF as needed up to every 4 hours No last refill date sent   Method Requested: Electronic Initial call taken by: Sydell Axon LPN,  November 05, 2009 10:16 AM  Follow-up for Phone Call       Follow-up by: Crawford Givens MD,  November 05, 2009 12:32 PM    Prescriptions: PROAIR HFA 108 (90 BASE) MCG/ACT  AERS (ALBUTEROL SULFATE) 1 PUFF as needed up to every 4 hours  #3 x 3   Entered and Authorized by:   Crawford Givens MD   Signed by:   Crawford Givens MD on 11/05/2009   Method used:   Electronically to        [facility]* (retail)             ,          Ph: [phone]       Fax: [phone]   RxID:   [id number]

## 2010-04-09 NOTE — Letter (Signed)
Summary: Dr.Michael Altheimer,[facility],Note  Dr.Michael Altheimer,[facility],Note   Imported By: Beau Fanny 02/23/2010 09:01:56  _____________________________________________________________________  External Attachment:    Type:   Image     Comment:   External Document

## 2010-04-09 NOTE — Assessment & Plan Note (Signed)
Summary: Pulmonary/ new pt eval for copd with hfa only 50% effective   Copy to:  Dr. Leslie Dales Primary Provider/Referring Provider:  Dr. Ferd Glassing  CC:  COPD followup.  Pt last seen in May 2006.  She c/o worsening SOB for the past several yrs.  She gets out of breath walking up stairs and walking flat surface approx 500 ft.  She states that hot/humid weather makes breathing worse.  Marland Kitchen  History of Present Illness: 75 yowf quit smoking 09/2009 with GOLD II COPD  FEV1 1.42 (64%) in  2004   .  September 16, 2009  1st pulmonary office eval in em[place]  cc COPD followup.  Pt last seen in May 2006.  She c/o worsening SOB for the past several yrs.  She gets out of breath walking up stairs and walking flat surface approx 500 ft.  She states that hot/humid weather makes breathing worse.   supposed to be taking spiriva and advair but not consistent. better after proaire though hfa technique margnial. Notes mild reflux/ dysphagia/ hoarseness also.  Pt denies any significant sore throat, dysphagia, itching, sneezing,  nasal congestion or excess secretions,  fever, chills, sweats, unintended wt loss, pleuritic or exertional cp, hempoptysis, change in activity tolerance  orthopnea pnd or leg swelling Pt also denies any obvious fluctuation in symptoms with weather or environmental change or other alleviating or aggravating factors or sign noct or early am exac of symptoms or overuse of saba.    Current Medications (verified): 1)  Prozac 10 Mg Caps (Fluoxetine Hcl) .Marland Kitchen.. 1 Two Times A Day 2)  Norvasc 5 Mg Tabs (Amlodipine Besylate) .Marland Kitchen.. 1 Daily By Mouth 3)  Spiriva Handihaler 18 Mcg Caps (Tiotropium Bromide Monohydrate) .... Inhale 1 Capsule By Mouth Once A Day 4)  Nexium 40 Mg Cpdr (Esomeprazole Magnesium) .Marland Kitchen.. 1 Daily By Mouth 5)  Advair Diskus 250-50 Mcg/dose Misc (Fluticasone-Salmeterol) .... Inhale 1 Puff Twice A Day As Needed 6)  Alprazolam 0.25 Mg Tabs (Alprazolam) .... Take One By Mouth Every 6 Hours As  Needed 7)  Flonase 50 Mcg/act  Susp (Fluticasone Propionate) .Marland Kitchen.. 1 Sp Ea Nostril Every Day 8)  Proair Hfa 108 (90 Base) Mcg/act  Aers (Albuterol Sulfate) .Marland Kitchen.. 1 Puff As Needed Up To Every 4 Hours 9)  Fexofenadine Hcl 30 Mg Tabs (Fexofenadine Hcl) .... Take 1 Tablet By Mouth Once A Day As Needed 10)  Synthroid 150 Mcg Tabs (Levothyroxine Sodium) .Marland Kitchen.. 1 Daily By Mouth 11)  Crestor 10 Mg Tabs (Rosuvastatin Calcium) .... Take 1 Tablet By Mouth At Bedtime  (Takes 3 /week) 12)  Vitamin D 95621 Unit Caps (Ergocalciferol) .Marland Kitchen.. 1 Tablet Weekly By Mouth 13)  Zetia 10 Mg Tabs (Ezetimibe) .... Take One By Mouth Daily 14)  Epipen 0.3 Mg/0.59ml Devi (Epinephrine) .... Use As Directed As Needed 15)  Coenzyme Q10 200 Mg Caps (Coenzyme Q10) .... Take 1 Tablet By Mouth Once A Day 16)  Januvia 50 Mg Tabs (Sitagliptin Phosphate) .... One Tab By Mouth Once Daily 17)  Diovan 160 Mg Tabs (Valsartan) .... One Tab By Mouth Once Daily 18)  Synthroid 25 Mcg Tabs (Levothyroxine Sodium) .... One Tablet By Mouth Every Other Day 19)  Vitamin B-12 500 Mcg  Tabs (Cyanocobalamin) .... 1,000 Mg. Once Daily 20)  Lyrica 50 Mg Caps (Pregabalin) .... Take 1 Capsule By Mouth Two Times A Day  Allergies (verified): 1)  ! Codeine 2)  ! Sulfa 3)  ! * Biaxin  Past History:  Past Medical History:  Depression (03/08/1997) Depression (12/07/1998) Hypothyroidism (03/08/1968) Hypertension (03/08/1992) Hyperlipidemia (10/04/2000) Diabetes mellitus, type II (02/05/2006) COPD (03/08/1998)    - PFT"s 12/12/2002  FEV1 1.42 (64%) ratio 58 with no better after B2 and DLCO75% Current Problems:  VENTRAL HERNIA (ICD-553.20) ESOPHAGITIS (ICD-530.10) EDEMA (ICD-782.3) SOB (ICD-786.05) ALLERGY (ICD-995.3) BRONCHITIS, OBSTRUCTIVE CHRONIC W/EXACRB (ICD-491.21) ROTATOR CUFF SYNDROME, RIGHT (ICD-726.10) SYNDROME, ROTATOR CUFF NOS (ICD-726.10) OBESITY NOS (ICD-278.00) NUMBNESS (ICD-782.0) POSTMENOPAUSAL ON HORMONE REPLACEMENT THERAPY  (ICD-V07.4) COPD (ICD-496) DIABETES MELLITUS, TYPE II (ICD-250.00) HYPERLIPIDEMIA (ICD-272.4) HYPERTENSION (ICD-401.9) HYPOTHYROIDISM (ICD-244.9) DEPRESSION (ICD-311)  Family History: Father dec 75 Emphysema One lung  Mother dec 93 Heart Thyrroid Dz  Brother A 50 Unknown Brother A 66 Osteo Hyperthyr Brother A 62 BACK Probs Sister A 60 Breast Cysts Fibro Back Probs Hypothyr no esophageal cancer No known head and neck cancers.  Social History: married, 2 children, she is retired, former smoker, drinks 2 alcoholic beverages per week, drinks 4 caffeinated beverages per day.  Enjoys painting.   Former smoker.  Quit on July 9th 2011.  She smoked 40 yrs up to 1 ppd  Review of Systems       The patient complains of shortness of breath with activity, non-productive cough, weight change, difficulty swallowing, nasal congestion/difficulty breathing through nose, anxiety, depression, and joint stiffness or pain.  The patient denies shortness of breath at rest, productive cough, coughing up blood, chest pain, irregular heartbeats, acid heartburn, indigestion, loss of appetite, abdominal pain, sore throat, tooth/dental problems, headaches, sneezing, itching, ear ache, hand/feet swelling, rash, change in color of mucus, and fever.    Vital Signs:  Patient profile:   75 year old female Weight:      229.13 pounds BMI:     39.47 O2 Sat:      97 % on Room air Temp:     98.2 degrees F oral Pulse rate:   76 / minute BP sitting:   120 / 60  (left arm) Cuff size:   large  Vitals Entered By: Vernie Murders (September 16, 2009 11:14 AM)  O2 Flow:  Room air  Physical Exam  Additional Exam:  hoarse pleasant wf with classic pseudowheeze resolves with purse lip maneuver  wt 229 September 16, 2009 HEENT mild turbinate edema.  Oropharynx no thrush or excess pnd or cobblestoning.  No JVD or cervical adenopathy. Mild accessory muscle hypertrophy. Trachea midline, nl thryroid. Chest was hyperinflated by percussion  with diminished breath sounds and moderate increased exp time without wheeze. Hoover sign positive at mid inspiration. Regular rate and rhythm without murmur gallop or rub or increase P2 or edema.  Abd: no hsm, nl excursion. Ext warm without cyanosis or clubbing.      CXR  Procedure date:  09/16/2009  Findings:      Comparison: 01/31/2009   Findings: Heart size upper normal.  Heavy calcification in the transverse arch of the aorta.  No congestive heart failure or pleural fluid.  Lungs hyperaerated consistent with COPD.  No active disease.  Osseous structures intact.   IMPRESSION: COPD - no active disease.    Impression & Recommendations:  Problem # 1:  COPD (ICD-496)    DDX of  difficult airways managment all start with A and  include Adherence, Ace Inhibitors, Acid Reflux, Active Sinus Disease, Alpha 1 Antitripsin deficiency, Anxiety masquerading as Airways dz,  ABPA,  allergy(esp in young), Aspiration (esp in elderly), Adverse effects of DPI,  Active smokers, plus one B  = Beta blocker use..   Adherence: clearly not ideal  on spiriva and advair - try change to symbicort 160 2 puffs first thing  in am and 2 puffs again in pm about 12 hours later I spent extra time with the patient today explaining optimal mdi  technique.  This improved from  50-75%  Active smoking: discussed maintaining abstinence key  Adverse effect of DPI - try off advair first and if better then stop spriva too.   Each maintenance medication was reviewed in detail including most importantly the difference between maintenance and as needed and under what circumstances the prns are to be used. See instructions for specific recommendations   Orders: T-2 View CXR (71020TC) Consultation Level V (04540)  Problem # 2:  HOARSENESS (ICD-784.49)  Likely from reflux. reviewed importance of ppi ac and diet - consider ent eval if not improving  Orders: Consultation Level V (98119)  Medications Added to  Medication List This Visit: 1)  Prozac 10 Mg Caps (Fluoxetine hcl) .Marland Kitchen.. 1 two times a day 2)  Nexium 40 Mg Cpdr (Esomeprazole magnesium) .... Take  one 30-60 min before first meal of the day 3)  Advair Diskus 250-50 Mcg/dose Misc (Fluticasone-salmeterol) .... Inhale 1 puff twice a day as needed 4)  Fexofenadine Hcl 30 Mg Tabs (Fexofenadine hcl) .... Take 1 tablet by mouth once a day as needed 5)  Symbicort 160-4.5 Mcg/act Aero (Budesonide-formoterol fumarate) .... 2 puffs first thing  in am and 2 puffs again in pm about 12 hours later  Patient Instructions: 1)  stop advair 2)  Symbicort 2 puffs first thing  in am and 2 puffs again in pm about 12 hours later  3)  Take spiriva after symbicort  4)  if needed proaire 2 puffs every 4 hours with goal of needing it less than twice daily  5)  Nexium Take  one 30-60 min before first meal of the day 6)  GERD (REFLUX)  is a common cause of respiratory symptoms. It commonly presents without heartburn and can be treated with medication, but also with lifestyle changes including avoidance of late meals, excessive alcohol, smoking cessation, and avoid fatty foods, chocolate, peppermint, colas, red wine, and acidic juices such as orange juice. NO MINT OR MENTHOL PRODUCTS SO NO COUGH DROPS  7)  USE SUGARLESS CANDY INSTEAD (jolley ranchers)  8)  NO OIL BASED VITAMINS  9)  Please schedule a follow-up appointment in 6 weeks, sooner if needed with PFT's on return Prescriptions: SYMBICORT 160-4.5 MCG/ACT  AERO (BUDESONIDE-FORMOTEROL FUMARATE) 2 puffs first thing  in am and 2 puffs again in pm about 12 hours later  #3 x 3   Entered and Authorized by:   Nyoka Cowden MD   Signed by:   Nyoka Cowden MD on 09/16/2009   Method used:   Electronically to        [facility]* (retail)             ,          Ph: [phone]       Fax: [phone]   RxID:   [id number]

## 2010-04-09 NOTE — Progress Notes (Signed)
SummaryPrudy Feeler REFILL FOR DR. SCHALLER  Phone Note Refill Request Message from:  Fax from Pharmacy on May 09, 2008 4:18 PM  Refills Requested: Medication #1:  ALPRAZOLAM 0.25 MG TABS 1 q6 hrs prn   Last Ref[facility]: [phone]  Initial call taken by: Providence Crosby,  May 09, 2008 4:19 PM  Follow-up for Phone Call        ok, 1 month supply prescribtion called to pharmacy Follow-up by: Hannah Beat MD,  May 09, 2008 5:16 PM

## 2010-04-09 NOTE — Letter (Signed)
Summary: [facility]   Imported By: Esmeralda Links D'jimraou 02/03/2008 09:28:08  _____________________________________________________________________  External Attachment:    Type:   Image     Comment:   External Document

## 2010-04-09 NOTE — Miscellaneous (Signed)
Summary: Appointment Canceled  Appointment status changed to canceled by [employer] on 02/12/2010 3:15 PM.  Cancellation Comments --------------------- [name] SCAN/DX:786.50/786.05//WT:231/INS:MCR/BCBS/DR [facility]  Appointment Information ----------------------- Appt Type:  CARDIOLOGY NUCLEAR TESTING      Date:  Thursday, February 19, 2010      Time:  9:30 AM for 15 min   Urgency:  Routine   Made By:  Pearson Grippe  To Visit:  [id number]    Reason:  [name] SCAN/DX:786.50/786.05//WT:231/INS:MCR/BCBS/DR GOLLAN  Appt Comments ------------- -- 02/12/10 15:15: (CEMR) CANCELED -- [name] SCAN/DX:786.50/786.05//WT:231/INS:MCR/BCBS/DR GOLLAN -- 02/11/10 16:53: (CEMR) BOOKED -- Routine CARDIOLOGY NUCLEAR TESTING at 02/19/2010 9:30 AM for 15 min [name] SCAN/DX:786.50/786.05//WT:231/INS:MCR/BCBS/DR GO  Appended Document: Appointment Canceled please send to cardiology to help patient reschedule.   Appended Document: Appointment Canceled Faxed info to Cardiology (Dr. Mariah Milling) in [place] office and also to [facility]. office.

## 2010-04-09 NOTE — Letter (Signed)
Summary: Nadara Eaton letter  [place] [facility]  [address] [place], [place] [address]   Phone: [phone]  Fax: [phone]       10/09/2009 MRN: [id number]  SERIAH BROTZMAN [address] [place], [place]  [address]  Dear Ms. [facility] [place], and [place] announce the retirement of Arta Silence, M.D., from full-time practice at the [facility] office effective September 04, 2009 and his plans of returning part-time.  It is important to Dr. Hetty Ely and to our practice that you understand that [facility] has seven physicians in our office for your health care needs.  We will continue to offer the same exceptional care that you have today.    Dr. Hetty Ely has spoken to many of you about his plans for retirement and returning part-time in the fall.   We will continue to work with you through the transition to schedule appointments for you in the office and meet the high standards that [place] is committed to.   Again, it is with great pleasure that we share the news that Dr. Hetty Ely will return to [facility] in October of 2011 with a reduced schedule.    If you have any questions, or would like to request an appointment with one of our physicians, please call us at [phone] and press the option for Scheduling an appointment.  We take pleasure in providing you with excellent patient care and look forward to seeing you at your next office visit.  Our [facility] Physicians are:  Tillman Abide, M.D. Laurita Quint, M.D. Roxy Manns, M.D. Kerby Nora, M.D. Hannah Beat, M.D. Ruthe Mannan, M.D. We proudly welcomed Raechel Ache, M.D. and Eustaquio Boyden, M.D. to the practice in July/August 2011.  Sincerely,  [facility]

## 2010-04-09 NOTE — Progress Notes (Signed)
Summary: alprazolam refill request  Phone Note Refill Request Message from:  Fax from Pharmacy  Refills Requested: Medication #1:  ALPRAZOLAM 0.25 MG TABS 1 q6 hrs prn   Last Refilled: 04/19/2007 [facility] [address]  Initial call taken by: Lowella Petties,  January 11, 2008 10:49 AM  Follow-up for Phone Call        [facility] [address] Follow-up by: Lowella Petties,  January 11, 2008 4:08 PM      Prescriptions: ALPRAZOLAM 0.25 MG TABS (ALPRAZOLAM) 1 q6 hrs prn  #30 x 0   Entered and Authorized by:   Shaune Leeks MD   Signed by:   Shaune Leeks MD on 01/11/2008   Method used:   [facility]ph[facility] ...       [facility] [id number]       [address]       [place], [place]  [address]       Ph: [phone] or [phone]       Fax: [phone]   RxID:   [id number]  Shaune Leeks MD  January 11, 2008 11:34 AM

## 2010-04-09 NOTE — Assessment & Plan Note (Signed)
Summary: FLU SHOT / LFW   Nurse Visit    Prior Medications: PROZAC 10 MG CAPS (FLUOXETINE HCL) 1 daily by mouth WELLBUTRIN SR 150 MG TB12 (BUPROPION HCL) 1 daily by mouth NORVASC 5 MG TABS (AMLODIPINE BESYLATE) 1 daily by mouth SPIRIVA HANDIHALER 18 MCG CAPS (TIOTROPIUM BROMIDE MONOHYDRATE) Inhale 1 capsule by mouth once a day NEXIUM 40 MG CPDR (ESOMEPRAZOLE MAGNESIUM) 1 daily by mouth SYNTHROID 137 MCG TABS (LEVOTHYROXINE SODIUM) 1 daily by mouth ADVAIR DISKUS 250-50 MCG/DOSE MISC (FLUTICASONE-SALMETEROL) Inhale 1 puff twice a day ALPRAZOLAM 0.25 MG TABS (ALPRAZOLAM) 1 q6 hrs prn ALLEGRA 60 MG  TABS (FEXOFENADINE HCL) one tab by mouth two times a day prn FLONASE 50 MCG/ACT  SUSP (FLUTICASONE PROPIONATE) 1 SP EA NOSTRIL EVERY DAY PROAIR HFA 108 (90 BASE) MCG/ACT  AERS (ALBUTEROL SULFATE) 1 PUFF as needed up to every 4 hours VALIUM 5 MG TABS (DIAZEPAM) take to CT appt for anxiety and have driver with you Current Allergies: ! CODEINE ! SULFA ! * BIAXIN    Orders Added: 1)  Flu Vaccine 47yrs + [90658] 2)  Administration Flu vaccine [G0008]    ]              Flu Vaccine Consent Questions     Do you have a history of severe allergic reactions to this vaccine? no    Any prior history of allergic reactions to egg and/or gelatin? no    Do you have a sensitivity to the preservative Thimersol? no    Do you have a past history of Guillan-Barre Syndrome? no    Do you currently have an acute febrile illness? no    Have you ever had a severe reaction to latex? no    Vaccine information given and explained to patient? yes    Are you currently pregnant? no    Lot Number:[id number]   Exp Date:09/04/2008   Site Given  Left Deltoid IM

## 2010-04-09 NOTE — Progress Notes (Signed)
Summary: refill request for alprazolam  Phone Note Refill Request Message from:  Fax from Pharmacy  Refills Requested: Medication #1:  ALPRAZOLAM 0.25 MG TABS Take one by mouth every 6 hours as needed   Last Refilled: 05/09/2008 [facility] [address], [phone].  Initial call taken by: Lowella Petties CMA,  May 22, 2009 8:31 AM  Follow-up for Phone Call        [facility] Follow-up by: Lowella Petties CMA,  May 22, 2009 9:23 AM    Prescriptions: ALPRAZOLAM 0.25 MG TABS (ALPRAZOLAM) Take one by mouth every 6 hours as needed  #60 x 0   Entered and Authorized by:   Shaune Leeks MD   Signed by:   Shaune Leeks MD on 05/22/2009   Method used:   Telephoned to ...       [facility]* (mail-order)             ,          Ph: [phone]       Fax: [phone]   RxID:   [id number]

## 2010-04-09 NOTE — Letter (Signed)
Summary: [facility]   Imported By: [facility] 03/20/2009 07:53:16  _____________________________________________________________________  External Attachment:    Type:   Image     Comment:   External Document

## 2010-04-09 NOTE — Assessment & Plan Note (Addendum)
Summary: 6 WEEK RETURN/[facility]   Copy to:  Dr. Leslie Dales, Dr. Mariah Milling, Dr. Sherene Sires, Dr. Leslee Home Primary Provider/Referring Provider:  Dr. Ferd Glassing  CC:  6 week follow up. Pt states she wears her cpap everynight 4-5 hrs a night. Pt states she has been having dry mouth after using her cpap. Marland Kitchen  History of Present Illness: 89 yowf quit smoking   Dec 2006 with GOLD II COPD  FEV1 1.42 (64%) in  2004.  Also mild OSA.  She is here for follow up after CPAP set up.  She has a full face mask.  She has done well.  She is sleeping better, and has more energy during the day.  She gets a dry mouth from the mask, but otherwise no problems.  She denies mask leak.  She goes to bed at 11pm, and wakes up at 9am.  She is up every two hours to use the bathroom.  She is scheduled to have repair of her right rotator cuff and biceps tendon with Dr. Leslee Home.     Problems Prior to Update: 1)  Obstructive Sleep Apnea  (ICD-327.23) 2)  Murmur  (ICD-785.2) 3)  Sleep Disorder Unspecified  (ICD-780.50) 4)  Chest Pain  (ICD-786.50) 5)  Onychomycosis, Toenails  (ICD-110.1) 6)  Leg Pain  (ICD-729.5) 7)  Palpitations  (ICD-785.1) 8)  Need Prophylactic Vaccination&inoculation Flu  (ICD-V04.81) 9)  Hoarseness  (ICD-784.49) 10)  Esophageal Stenosis  (ICD-530.3) 11)  Choking  (ICD-933.1) 12)  Dysphagia  (ICD-787.29) 13)  Ventral Hernia  (ICD-553.20) 14)  Esophagitis  (ICD-530.10) 15)  Edema  (ICD-782.3) 16)  Sob  (ICD-786.05) 17)  Allergy  (ICD-995.3) 18)  Bronchitis, Obstructive Chronic W/exacrb  (ICD-491.21) 19)  Rotator Cuff Syndrome, Right  (ICD-726.10) 20)  Obesity Nos  (ICD-278.00) 21)  Numbness  (ICD-782.0) 22)  Postmenopausal On Hormone Replacement Therapy  (ICD-V07.4) 23)  COPD  (ICD-496) 24)  Diabetes Mellitus, Type II, With Neurological Complications  (ICD-250.60) 25)  Hyperlipidemia  (ICD-272.4) 26)  Hypertension  (ICD-401.9) 27)  Hypothyroidism  (ICD-244.9) 28)  Depression   (ICD-311)  Current Medications (verified): 1)  Prozac 10 Mg Caps (Fluoxetine Hcl) .Marland Kitchen.. 1 Two Times A Day 2)  Alprazolam 0.25 Mg Tabs (Alprazolam) .... Take One By Mouth Every 6 Hours As Needed 3)  Isosorbide Mononitrate Cr 30 Mg Xr24h-Tab (Isosorbide Mononitrate) .... 1/2 By Mouth Daily --Take Only As Needed For Chest Pain. 4)  Diovan 160 Mg Tabs (Valsartan) .... One Tab By Mouth Once Daily 5)  Norvasc 5 Mg Tabs (Amlodipine Besylate) .Marland Kitchen.. 1 Daily By Mouth 6)  Crestor 10 Mg Tabs (Rosuvastatin Calcium) .Marland Kitchen.. 1 At Bedtime 7)  Zetia 10 Mg Tabs (Ezetimibe) .... Take One By Mouth Daily 8)  Coenzyme Q10 200 Mg Caps (Coenzyme Q10) .... Take 1 Tablet By Mouth Once A Day 9)  Pepcid 20 Mg Tabs (Famotidine) .... Take One By Mouth At Bedtime 10)  Nexium 40 Mg Cpdr (Esomeprazole Magnesium) .... Take  One 30-60 Min Before First Meal of The Day 11)  Flonase 50 Mcg/act  Susp (Fluticasone Propionate) .Marland Kitchen.. 1 Sp Ea Nostril Every Day 12)  Fexofenadine Hcl 30 Mg Tabs (Fexofenadine Hcl) .... Take 1 Tablet By Mouth Once A Day As Needed 13)  Symbicort 160-4.5 Mcg/act  Aero (Budesonide-Formoterol Fumarate) .... 2 Puffs First Thing  in Am and 2 Puffs Again in Pm About 12 Hours Later 14)  Spiriva Handihaler 18 Mcg  Caps (Tiotropium Bromide Monohydrate) .... Two Puffs in Handihaler Daily 15)  Proair  Hfa 108 (90 Base) Mcg/act  Aers (Albuterol Sulfate) .Marland Kitchen.. 1 Puff As Needed Up To Every 4 Hours 16)  Januvia 50 Mg Tabs (Sitagliptin Phosphate) .... One Tab By Mouth Once Daily 17)  Synthroid 150 Mcg Tabs (Levothyroxine Sodium) .Marland Kitchen.. 1 Daily By Mouth 18)  Synthroid 25 Mcg Tabs (Levothyroxine Sodium) .... One Tablet By Mouth Every Other Day Out of This Medication 19)  Vitamin D 04540 Unit Caps (Ergocalciferol) .Marland Kitchen.. 1 Tablet Weekly By Mouth 20)  Vitamin B-12 500 Mcg  Tabs (Cyanocobalamin) .... 1,000 Mg. Once Daily 21)  Tylenol Extra Strength 500 Mg Tabs (Acetaminophen) .... Per Bottle Directions As Needed 22)  Nitrostat 0.4 Mg Subl  (Nitroglycerin) .Marland Kitchen.. 1 Tablet Under Tongue At Onset of Chest Pain; You May Repeat Every 5 Minutes For Up To 3 Doses. 23)  Epipen 0.3 Mg/0.52ml Devi (Epinephrine) .... Use As Directed As Needed 24)  Tramadol Hcl 50 Mg Tabs (Tramadol Hcl) .... Take 1 Every 4-6 Hours As Needed For Pain 25)  Aspirin Ec 81 Mg Tbec (Aspirin) .... One Tablet Once Daily  Allergies (verified): 1)  ! Codeine 2)  ! Sulfa 3)  ! * Biaxin 4)  ! * Decongestants  Family History: Reviewed history from 01/13/2010 and no changes required. Father dec 75 Emphysema One lung  Mother dec 93 Heart Thyrroid Dz  Brother A 15 Unknown Brother A 66 Osteo Hyperthyr Brother A 62 BACK Probs Sister A 60 Breast Cysts Fibro Back Probs Hypothyr no esophageal cancer No known head and neck cancers. no premature ascvd  Social History: Reviewed history from 09/16/2009 and no changes required. married, 2 children, she is retired, former smoker, drinks 2 alcoholic beverages per week, drinks 4 caffeinated beverages per day.  Enjoys painting.   Former smoker.  Quit on 02/2006. 1 1/2- 2 ppd.   Vital Signs:  Patient profile:   75 year old female Height:      65 inches Weight:      230.50 pounds BMI:     38.50 O2 Sat:      94 % on Room air Temp:     98.2 degrees F oral Pulse rate:   80 / minute BP sitting:   118 / 68  (left arm) Cuff size:   large  Vitals Entered By: Carver Fila (March 25, 2010 1:32 PM)  O2 Flow:  Room air CC: 6 week follow up. Pt states she wears her cpap everynight 4-5 hrs a night. Pt states she has been having dry mouth after using her cpap.  Comments meds and allergies updated Phone number updated Mindy Edward Jolly  March 25, 2010 1:32 PM    Physical Exam  General:  normal appearance, healthy appearing, and obese.   Nose:  no deformity, discharge, inflammation, or lesions Mouth:  MP 2, wears dentures, no exudate Neck:  no JVD.   Lungs:  clear bilaterally to auscultation and percussion Heart:  regular rate  and rhythm, S1, S2 without murmurs, rubs, gallops, or clicks Extremities:  no clubbing, cyanosis, edema, or deformity noted Neurologic:  normal CN II-XII and strength normal.   Cervical Nodes:  no significant adenopathy Psych:  alert and cooperative; normal mood and affect; normal attention span and concentration   Impression & Recommendations:  Problem # 1:  OBSTRUCTIVE SLEEP APNEA (ICD-327.23)  She has done well with CPAP.  Will get her CPAP download, and call her with results.  She is to contact her DME about adjusting her humidifer settings to see if this improves  mouth dryness.  Problem # 2:  ROTATOR CUFF SYNDROME, RIGHT (ICD-726.10) She is scheduled to have rotator cuff repair.  I advised her to inform her surgeon and anesthesiologist about her diagnosis of sleep apnea so that appropriate precautions can be taken.  Complete Medication List: 1)  Prozac 10 Mg Caps (Fluoxetine hcl) .Marland Kitchen.. 1 two times a day 2)  Alprazolam 0.25 Mg Tabs (Alprazolam) .... Take one by mouth every 6 hours as needed 3)  Isosorbide Mononitrate Cr 30 Mg Xr24h-tab (Isosorbide mononitrate) .... 1/2 by mouth daily --take only as needed for chest pain. 4)  Diovan 160 Mg Tabs (Valsartan) .... One tab by mouth once daily 5)  Norvasc 5 Mg Tabs (Amlodipine besylate) .Marland Kitchen.. 1 daily by mouth 6)  Crestor 10 Mg Tabs (Rosuvastatin calcium) .Marland Kitchen.. 1 at bedtime 7)  Zetia 10 Mg Tabs (Ezetimibe) .... Take one by mouth daily 8)  Coenzyme Q10 200 Mg Caps (Coenzyme q10) .... Take 1 tablet by mouth once a day 9)  Pepcid 20 Mg Tabs (Famotidine) .... Take one by mouth at bedtime 10)  Nexium 40 Mg Cpdr (Esomeprazole magnesium) .... Take  one 30-60 min before first meal of the day 11)  Flonase 50 Mcg/act Susp (Fluticasone propionate) .Marland Kitchen.. 1 sp ea nostril every day 12)  Fexofenadine Hcl 30 Mg Tabs (Fexofenadine hcl) .... Take 1 tablet by mouth once a day as needed 13)  Symbicort 160-4.5 Mcg/act Aero (Budesonide-formoterol fumarate) .... 2  puffs first thing  in am and 2 puffs again in pm about 12 hours later 14)  Spiriva Handihaler 18 Mcg Caps (Tiotropium bromide monohydrate) .... Two puffs in handihaler daily 15)  Proair Hfa 108 (90 Base) Mcg/act Aers (Albuterol sulfate) .Marland Kitchen.. 1 puff as needed up to every 4 hours 16)  Januvia 50 Mg Tabs (Sitagliptin phosphate) .... One tab by mouth once daily 17)  Synthroid 150 Mcg Tabs (Levothyroxine sodium) .Marland Kitchen.. 1 daily by mouth 18)  Synthroid 25 Mcg Tabs (Levothyroxine sodium) .... One tablet by mouth every other day out of this medication 19)  Vitamin D 60454 Unit Caps (Ergocalciferol) .Marland Kitchen.. 1 tablet weekly by mouth 20)  Vitamin B-12 500 Mcg Tabs (Cyanocobalamin) .... 1,000 mg. once daily 21)  Tylenol Extra Strength 500 Mg Tabs (Acetaminophen) .... Per bottle directions as needed 22)  Nitrostat 0.4 Mg Subl (Nitroglycerin) .Marland Kitchen.. 1 tablet under tongue at onset of chest pain; you may repeat every 5 minutes for up to 3 doses. 23)  Epipen 0.3 Mg/0.44ml Devi (Epinephrine) .... Use as directed as needed 24)  Tramadol Hcl 50 Mg Tabs (Tramadol hcl) .... Take 1 every 4-6 hours as needed for pain 25)  Aspirin Ec 81 Mg Tbec (Aspirin) .... One tablet once daily  Other Orders: Est. Patient Level III (09811) DME Referral (DME)  Patient Instructions: 1)  Follow up in 6 months   Immunization History:  Influenza Immunization History:    Influenza:  historical (01/06/2010)

## 2010-04-09 NOTE — Assessment & Plan Note (Signed)
Summary: F1M/AMD  Medications Added ISOSORBIDE MONONITRATE CR 30 MG XR24H-TAB (ISOSORBIDE MONONITRATE) 1/2 by mouth daily --Take only as needed for chest pain. TRAMADOL HCL 50 MG TABS (TRAMADOL HCL) take 1 every 4-6 hours as needed for pain ASPIRIN EC 81 MG TBEC (ASPIRIN) one tablet once daily      Allergies Added:   Visit Type:  Follow-up Referring Provider:  Dr. Leslie Dales, Dr. Mariah Milling, Dr. Sherene Sires Primary Provider:  Dr. Ferd Glassing  CC:  Has a torn rotator cuff and bicep; she will need surgery this month.  Is having pain across her chest..  History of Present Illness: 75 year old woman with mild obesity, no known coronary artery disease, long history of smoking for 50 years, hyperlipidemia, hypertension, recent chest pain, who presents for followup after a recent stress test.  Recent stress test shows no ischemia. This was a lexiscan study. No wall motion abnormality noted.   echocardiogram was essentially normal.  she has been seen by orthopedics and found on MRI to have a right shoulder rotator cuff injury and biceps tear. She is scheduled for surgery in the near future. Her chest pain has significantly improved with pain medication for her right shoulder.  she wonders if her shortness of breath with exertion could be coming from her weight and being deconditioned.  she has also been diagnosed with obstructive sleep apnea and now uses CPAP. Her sleep has improved, she feels better  Old EKG shows normal sinus rhythm with rate 67 minute, poor R-wave progression through the precordial leads concerning for anteroseptal infarct  Current Medications (verified): 1)  Prozac 10 Mg Caps (Fluoxetine Hcl) .Marland Kitchen.. 1 Two Times A Day 2)  Alprazolam 0.25 Mg Tabs (Alprazolam) .... Take One By Mouth Every 6 Hours As Needed 3)  Isosorbide Mononitrate Cr 30 Mg Xr24h-Tab (Isosorbide Mononitrate) .... 1/2 By Mouth Daily 4)  Diovan 160 Mg Tabs (Valsartan) .... One Tab By Mouth Once Daily 5)   Norvasc 5 Mg Tabs (Amlodipine Besylate) .Marland Kitchen.. 1 Daily By Mouth 6)  Crestor 10 Mg Tabs (Rosuvastatin Calcium) .Marland Kitchen.. 1 At Bedtime 7)  Zetia 10 Mg Tabs (Ezetimibe) .... Take One By Mouth Daily 8)  Coenzyme Q10 200 Mg Caps (Coenzyme Q10) .... Take 1 Tablet By Mouth Once A Day 9)  Pepcid 20 Mg Tabs (Famotidine) .... Take One By Mouth At Bedtime 10)  Nexium 40 Mg Cpdr (Esomeprazole Magnesium) .... Take  One 30-60 Min Before First Meal of The Day 11)  Flonase 50 Mcg/act  Susp (Fluticasone Propionate) .Marland Kitchen.. 1 Sp Ea Nostril Every Day 12)  Fexofenadine Hcl 30 Mg Tabs (Fexofenadine Hcl) .... Take 1 Tablet By Mouth Once A Day As Needed 13)  Symbicort 160-4.5 Mcg/act  Aero (Budesonide-Formoterol Fumarate) .... 2 Puffs First Thing  in Am and 2 Puffs Again in Pm About 12 Hours Later 14)  Spiriva Handihaler 18 Mcg  Caps (Tiotropium Bromide Monohydrate) .... Two Puffs in Handihaler Daily 15)  Proair Hfa 108 (90 Base) Mcg/act  Aers (Albuterol Sulfate) .Marland Kitchen.. 1 Puff As Needed Up To Every 4 Hours 16)  Januvia 50 Mg Tabs (Sitagliptin Phosphate) .... One Tab By Mouth Once Daily 17)  Synthroid 150 Mcg Tabs (Levothyroxine Sodium) .Marland Kitchen.. 1 Daily By Mouth 18)  Synthroid 25 Mcg Tabs (Levothyroxine Sodium) .... One Tablet By Mouth Every Other Day Out of This Medication 19)  Vitamin D 81191 Unit Caps (Ergocalciferol) .Marland Kitchen.. 1 Tablet Weekly By Mouth 20)  Vitamin B-12 500 Mcg  Tabs (Cyanocobalamin) .... 1,000 Mg. Once Daily  21)  Tylenol Extra Strength 500 Mg Tabs (Acetaminophen) .... Per Bottle Directions As Needed 22)  Nitrostat 0.4 Mg Subl (Nitroglycerin) .Marland Kitchen.. 1 Tablet Under Tongue At Onset of Chest Pain; You May Repeat Every 5 Minutes For Up To 3 Doses. 23)  Epipen 0.3 Mg/0.80ml Devi (Epinephrine) .... Use As Directed As Needed 24)  Tramadol Hcl 50 Mg Tabs (Tramadol Hcl) .... Take 1 Every 4-6 Hours As Needed For Pain  Allergies (verified): 1)  ! Codeine 2)  ! Sulfa 3)  ! * Biaxin 4)  ! * Decongestants  Past History:  Past  Medical History: Last updated: 02/22/2010 Depression (12/07/1998) Hypothyroidism (03/08/1968) Hypertension (03/08/1992) Hyperlipidemia (10/04/2000) Diabetes mellitus, type II (02/05/2006)- Dr. Leslie Dales with endo COPD (03/08/1998)    - PFT"s 12/12/2002  FEV1 1.42 (64%) ratio 58 with no better after B2 and DLCO75%    - PFT's  9/ 15/11    FEV1  1.50 (73%) ratio 50 no better after B2 with DLC0 62%    - hfa 50% November 20, 2009  > 75% January 13, 2010 p coaching Obstructive sleep apnea     - PSG 01/27/10 AHI 13 VENTRAL HERNIA (ICD-553.20) ESOPHAGITIS (ICD-530.10) ALLERGY (ICD-995.3) ROTATOR CUFF SYNDROME, RIGHT (ICD-726.10) OBESITY NOS (ICD-278.00) Peripheral neuropathy, likley due to DM per Dr. Sandria Manly  Past Surgical History: Last updated: 01/26/2009 Tonsillectomy Rotator cuff repair R ([place]), 1984 C/S x 2 Breech/Repeat Hysterectomy, total due to dysmennorhea, 1985  Cholecystectomy, 1997 Thumb release R, 12/1997 Carpal tunnel repair R 12/1997 EGD nml (due to hoarseness), 12/05/1997 Abd U/S nml x 2 foci in liver, 03/19/1999 CT Abd hemangiomas of liver  1cm R renal cyst Cardiolite persantine nml, 08/24/2000 Carotid U/S 1-39% ICA stenosis, 08/24/2000 DEXA nml, 07/03/2003 Adenosine Myoview  nml 06/02/2007 Carotid U/S no apprec change  06/02/2007 Echo    06/02/2007 Lap Ventr Hernia Repair w/ Lysis of Adhesions (Dr Dwain Sarna) 01/24/2009  Family History: Last updated: 01/13/2010 Father dec 75 Emphysema One lung  Mother dec 93 Heart Thyrroid Dz  Brother A 68 Unknown Brother A 66 Osteo Hyperthyr Brother A 62 BACK Probs Sister A 60 Breast Cysts Fibro Back Probs Hypothyr no esophageal cancer No known head and neck cancers. no premature ascvd  Social History: Last updated: 09/16/2009 married, 2 children, she is retired, former smoker, drinks 2 alcoholic beverages per week, drinks 4 caffeinated beverages per day.  Enjoys painting.   Former smoker.  Quit on July 9th 2011.  She  smoked 40 yrs up to 1 ppd  Risk Factors: Alcohol Use: <1 (07/12/2006) Caffeine Use: 5+ (01/18/2008) Exercise: no (07/12/2006)  Risk Factors: Smoking Status: quit (06/05/2006) Passive Smoke Exposure: no (07/12/2006)  Review of Systems       The patient complains of dyspnea on exertion.  The patient denies fever, weight loss, weight gain, vision loss, decreased hearing, hoarseness, chest pain, syncope, peripheral edema, prolonged cough, abdominal pain, incontinence, muscle weakness, depression, and enlarged lymph nodes.         Right shoulder pain, some chest pain  Vital Signs:  Patient profile:   75 year old female Height:      65 inches Weight:      229 pounds BMI:     38.25 Pulse rate:   80 / minute BP sitting:   110 / 68  (left arm) Cuff size:   large  Vitals Entered By: Bishop Dublin, CMA (March 10, 2010 10:41 AM)  Physical Exam  General:  normal appearance, healthy appearing, and obese.  Head:  normocephalic and atraumatic Neck:  Neck supple, no JVD. No masses, thyromegaly or abnormal cervical nodes. Lungs:  Clear bilaterally to auscultation and percussion. Heart:  Non-displaced PMI, chest non-tender; regular rate and rhythm, S1, S2 without murmurs, rubs or gallops. Carotid upstroke normal, no bruit.  Pedals normal pulses. No edema, no varicosities. Abdomen:  Bowel sounds positive; abdomen soft and non-tender without masses, obese Msk:  Back normal, normal gait. Muscle strength and tone normal. Pulses:  pulses normal in all 4 extremities Extremities:  No clubbing or cyanosis. Neurologic:  Alert and oriented x 3. Skin:  Intact without lesions or rashes. Psych:  Normal affect.   Impression & Recommendations:  Problem # 1:  CHEST PAIN (ICD-786.50) given her normal stress test, improvement of her chest pain with pain medication given for her right shoulder rotator cuff tear, no further evaluation is needed. Chest pain is likely atypical in nature.  Given her low  risk stress test, she would be of low risk for upcoming shoulder surgery. We have suggested that she start aspirin 81 mg daily  Her updated medication list for this problem includes:    Isosorbide Mononitrate Cr 30 Mg Xr24h-tab (Isosorbide mononitrate) .Marland Kitchen... 1/2 by mouth daily --take only as needed for chest pain.    Norvasc 5 Mg Tabs (Amlodipine besylate) .Marland Kitchen... 1 daily by mouth    Nitrostat 0.4 Mg Subl (Nitroglycerin) .Marland Kitchen... 1 tablet under tongue at onset of chest pain; you may repeat every 5 minutes for up to 3 doses.    Aspirin Ec 81 Mg Tbec (Aspirin) ..... One tablet once daily  Problem # 2:  SOB (ICD-786.05) For her shortness of breath, we have suggested she  Increase her exercise, work on her weight loss.  Her updated medication list for this problem includes:    Diovan 160 Mg Tabs (Valsartan) ..... One tab by mouth once daily    Norvasc 5 Mg Tabs (Amlodipine besylate) .Marland Kitchen... 1 daily by mouth    Aspirin Ec 81 Mg Tbec (Aspirin) ..... One tablet once daily  Problem # 3:  HYPERTENSION (ICD-401.9) We have talked to her about her medications. She will look at the price of her Diovan. She could change this to losartan if needed If it is expensive. Her blood pressure is relatively well controlled.  Her updated medication list for this problem includes:    Diovan 160 Mg Tabs (Valsartan) ..... One tab by mouth once daily    Norvasc 5 Mg Tabs (Amlodipine besylate) .Marland Kitchen... 1 daily by mouth    Aspirin Ec 81 Mg Tbec (Aspirin) ..... One tablet once daily  Problem # 4:  HYPERLIPIDEMIA (ICD-272.4) cholesterol is very well controlled on her current medication regimen. In fact she could cut her zetia and half or even stop the medication if this has a high co-pay.  Her updated medication list for this problem includes:    Crestor 10 Mg Tabs (Rosuvastatin calcium) .Marland Kitchen... 1 at bedtime    Zetia 10 Mg Tabs (Ezetimibe) .Marland Kitchen... Take one by mouth daily  Patient Instructions: 1)  Your physician recommends that  you schedule a follow-up appointment in: as needed 2)  Your physician has recommended you make the following change in your medication: Do not take Isosorbide unless start having chest pain.  Start an aspirn 81 mg daily.  Contact PCP to see if can change the Diovan to Losartain.  Appended Document: F1M/AMD We did mention to her that she can hold her isosorbide. This was started for chest pain  and as her stress test is normal, is likely not need it on a chronic basis.

## 2010-04-09 NOTE — Miscellaneous (Signed)
Summary: Waiver of [employer]   Imported By: Esmeralda Links D'jimraou 12/07/2007 13:22:41  _____________________________________________________________________  External Attachment:    Type:   Image     Comment:   External Document

## 2010-04-09 NOTE — Progress Notes (Signed)
Summary: refill request for prozac  Phone Note Refill Request Message from:  Fax from Pharmacy  Refills Requested: Medication #1:  PROZAC 10 MG CAPS 1 two times a day Form from [facility] is on your desk.  I had sent in script the other day for 90, should have been 180.Marland Kitchen  Please sign and I will fax back.  Initial call taken by: Lowella Petties CMA,  November 03, 2009 10:07 AM  Follow-up for Phone Call        signed. Follow-up by: Crawford Givens MD,  November 03, 2009 1:59 PM  Additional Follow-up for Phone Call Additional follow up Details #1::        Faxed Additional Follow-up by: Delilah Shan CMA Kirbie Stodghill Dull),  November 03, 2009 2:47 PM

## 2010-04-09 NOTE — Assessment & Plan Note (Signed)
Summary: Pulmonary/ f/u copd - HFA 50% p coaching   Copy to:  Dr. Leslie Dales Primary Provider/Referring Provider:  Dr. Ferd Glassing  CC:  6 wk followup with PFT's. Pt states that her breathing has improved since last seen.  Still gets SOB walking up stairs.  She c/o "wheezing in throat".  .  History of Present Illness: 29 yowf quit smoking   Dec 2006 with GOLD II COPD  FEV1 1.42 (64%) in  2004   .  September 16, 2009  1st pulmonary office eval in emr  cc COPD followup.  Pt last seen in May 2006.  She c/o worsening SOB for the past several yrs.  She gets out of breath walking up stairs and walking flat surface approx 500 ft.  She states that hot/humid weather makes breathing worse.   supposed to be taking spiriva and advair but not consistent. better after proaire though hfa technique margnial. Notes mild reflux/ dysphagia/ hoarseness also.stop advair Symbicort 2 puffs first thing  in am and 2 puffs again in pm about 12 hours later  Take spiriva after symbicort  if needed proaire 2 puffs every 4 hours with goal of needing it less than twice daily  Nexium Take  one 30-60 min before first meal of the day  November 20, 2009 6 wk followup with PFT's. Pt states that her breathing has improved since last seen.  Still gets SOB walking up stairs.  She c/o "wheezing in throat".   using proaire once a week but also waking up once a week but no need for night time proaire.  no cough.  Pt denies any significant sore throat, dysphagia, itching, sneezing,  nasal congestion or excess secretions,  fever, chills, sweats, unintended wt loss, pleuritic or exertional cp, hempoptysis,  variability  in activity tolerance  orthopnea pnd or leg swelling. Pt also denies any obvious fluctuation in symptoms with weather or environmental change or other alleviating or aggravating factors.       Current Medications (verified): 1)  Prozac 10 Mg Caps (Fluoxetine Hcl) .Marland Kitchen.. 1 Two Times A Day 2)  Norvasc 5 Mg Tabs  (Amlodipine Besylate) .Marland Kitchen.. 1 Daily By Mouth 3)  Spiriva Handihaler 18 Mcg Caps (Tiotropium Bromide Monohydrate) .... Inhale 1 Capsule By Mouth Once A Day 4)  Nexium 40 Mg Cpdr (Esomeprazole Magnesium) .... Take  One 30-60 Min Before First Meal of The Day 5)  Alprazolam 0.25 Mg Tabs (Alprazolam) .... Take One By Mouth Every 6 Hours As Needed 6)  Flonase 50 Mcg/act  Susp (Fluticasone Propionate) .Marland Kitchen.. 1 Sp Ea Nostril Every Day 7)  Proair Hfa 108 (90 Base) Mcg/act  Aers (Albuterol Sulfate) .Marland Kitchen.. 1 Puff As Needed Up To Every 4 Hours 8)  Fexofenadine Hcl 30 Mg Tabs (Fexofenadine Hcl) .... Take 1 Tablet By Mouth Once A Day As Needed 9)  Synthroid 150 Mcg Tabs (Levothyroxine Sodium) .Marland Kitchen.. 1 Daily By Mouth 10)  Crestor 10 Mg Tabs (Rosuvastatin Calcium) .... Take 1 Tablet By Mouth At Bedtime  (Takes 3 /week) 11)  Vitamin D 30160 Unit Caps (Ergocalciferol) .Marland Kitchen.. 1 Tablet Weekly By Mouth 12)  Zetia 10 Mg Tabs (Ezetimibe) .... Take One By Mouth Daily 13)  Epipen 0.3 Mg/0.17ml Devi (Epinephrine) .... Use As Directed As Needed 14)  Coenzyme Q10 200 Mg Caps (Coenzyme Q10) .... Take 1 Tablet By Mouth Once A Day 15)  Januvia 50 Mg Tabs (Sitagliptin Phosphate) .... One Tab By Mouth Once Daily 16)  Diovan 160 Mg Tabs (Valsartan) .Marland KitchenMarland KitchenMarland Kitchen  One Tab By Mouth Once Daily 17)  Synthroid 25 Mcg Tabs (Levothyroxine Sodium) .... One Tablet By Mouth Every Other Day 18)  Vitamin B-12 500 Mcg  Tabs (Cyanocobalamin) .... 1,000 Mg. Once Daily 19)  Lyrica 50 Mg Caps (Pregabalin) .... Take 1 Capsule By Mouth Two Times A Day 20)  Symbicort 160-4.5 Mcg/act  Aero (Budesonide-Formoterol Fumarate) .... 2 Puffs First Thing  in Am and 2 Puffs Again in Pm About 12 Hours Later  Allergies (verified): 1)  ! Codeine 2)  ! Sulfa 3)  ! * Biaxin  Past History:  Past Medical History: Depression (03/08/1997) Depression (12/07/1998) Hypothyroidism (03/08/1968) Hypertension (03/08/1992) Hyperlipidemia (10/04/2000) Diabetes mellitus, type II  (02/05/2006) COPD (03/08/1998)    - PFT"s 12/12/2002  FEV1 1.42 (64%) ratio 58 with no better after B2 and DLCO75%    - PFT's  9/ 15/11    FEV1  .150 (73%) ratio 50 no better after B2 with DLC0 62%    - hfa 50% November 20, 2009  Current Problems:  VENTRAL HERNIA (ICD-553.20) ESOPHAGITIS (ICD-530.10) EDEMA (ICD-782.3) SOB (ICD-786.05) ALLERGY (ICD-995.3) BRONCHITIS, OBSTRUCTIVE CHRONIC W/EXACRB (ICD-491.21) ROTATOR CUFF SYNDROME, RIGHT (ICD-726.10) SYNDROME, ROTATOR CUFF NOS (ICD-726.10) OBESITY NOS (ICD-278.00) NUMBNESS (ICD-782.0) POSTMENOPAUSAL ON HORMONE REPLACEMENT THERAPY (ICD-V07.4) COPD (ICD-496) DIABETES MELLITUS, TYPE II (ICD-250.00) HYPERLIPIDEMIA (ICD-272.4) HYPERTENSION (ICD-401.9) HYPOTHYROIDISM (ICD-244.9) DEPRESSION (ICD-311)  Vital Signs:  Patient profile:   75 year old female Height:      65 inches Weight:      227 pounds BMI:     37.91 O2 Sat:      96 % on Room air Temp:     97.5 degrees F oral Pulse rate:   69 / minute BP sitting:   100 / 60  (left arm) Cuff size:   large  Vitals Entered ByVernie Murders (November 20, 2009 12:11 PM)  O2 Flow:  Room air  Physical Exam  Additional Exam:  hoarse pleasant wf with classic pseudowheeze improves  with purse lip maneuver  wt 229 September 16, 2009 >  227 November 20, 2009  HEENT mild turbinate edema.  Oropharynx no thrush or excess pnd or cobblestoning.  No JVD or cervical adenopathy. Mild accessory muscle hypertrophy. Trachea midline, nl thryroid. Chest was hyperinflated by percussion with diminished breath sounds and moderate increased exp time without wheeze. Hoover sign positive at mid inspiration. Regular rate and rhythm without murmur gallop or rub or increase P2 or edema.  Abd: no hsm, nl excursion. Ext warm without cyanosis or clubbing.      Impression & Recommendations:  Problem # 1:  COPD (ICD-496) Only GOLD II so may do just as well off spiriva at this point but needs to learn optimal hfa to  avoid irriation of upper airway  I spent extra time with the patient today explaining optimal mdi  technique.  This improved from  25-50% with coaching   Each maintenance medication was reviewed in detail including most importantly the difference between maintenance and as needed and under what circumstances the prns are to be used.   Problem # 2:  HOARSENESS (ICD-784.49)  this plus noct awakening may be related to gerd or adverse effects of dpi, try off spiriva and on pepcid and hs.  Orders: Est. Patient Level IV (81191)  Patient Instructions: 1)  Work on inhaler technique:  relax and blow all the way out then take a nice smooth deep breath back in, triggering the inhaler at same time you start breathing in and hold a  few seconds 2)  try off spiriva - I think of spiriva in this setting like purchasing high octane fuel for an older car with lots of miles on it. It may help the perfomance enough to warrant the purchase, but it won't change the longevity of the car or make it any easier parking it. It should improve peak performance if the patient is patient and lets the medicine work the way it's intended. 3)  Return to office in 3 months, sooner if needed  4)  If you continue to waken at night with breathing problems try taking pepcid ac 20 mg one at bedtime to see if this helps

## 2010-04-09 NOTE — Letter (Signed)
Summary: [facility] / F/U ENDOCRINOLOGY / DR. MICHAEL ALTHEIMER   [facility] / F/U ENDOCRINOLOGY / DR. MICHAEL ALTHEIMER   Imported By: Carin Primrose 06/14/2008 10:31:10  _____________________________________________________________________  External Attachment:    Type:   Image     Comment:   External Document

## 2010-04-09 NOTE — Progress Notes (Signed)
Summary: refill request from [facility]  Phone Note Refill Request Message from:  Fax from Pharmacy  Refills Requested: Medication #1:  SPIRIVA HANDIHALER 18 MCG CAPS Inhale 1 capsule by mouth once a day Faxed form from [facility] is on your shelf.  Initial call taken by: Lowella Petties,  September 02, 2008 9:20 AM  Follow-up for Phone Call        px printed out for fax   Follow-up by: Judith Part MD,  September 02, 2008 10:10 AM  Additional Follow-up for Phone Call Additional follow up Details #1::        Rx faxed to pharmacy Additional Follow-up by: Liane Comber,  September 02, 2008 10:44 AM      Prescriptions: SPIRIVA HANDIHALER 18 MCG CAPS (TIOTROPIUM BROMIDE MONOHYDRATE) Inhale 1 capsule by mouth once a day  #90 x 1   Entered and Authorized by:   Judith Part MD   Signed by:   Judith Part MD on 09/02/2008   Method used:   Printed then faxed to ...       [facility]* (mail-order)             ,          Ph: [phone]       Fax: [phone]   RxID:   [id number]

## 2010-04-09 NOTE — Progress Notes (Signed)
Summary: sob > ov w/ MW 11.8.11  Phone Note Call from Patient   Caller: Patient Call For: wert Summary of Call: pt getting sob on exertion. she was not feeling this way until dr wert took her off of spiriva.  Initial call taken by: Rickard Patience,  January 13, 2010 9:01 AM  Follow-up for Phone Call        called spoke with patient who c/o constant tightness in chest and increased SOB worsening over the past 2 weeks.  pt states she has been off the spiriva since last ov on 9.15.11.  appt scheduled with MW today @ 1130 to discuss.  pt okay with this date and time. Boone Master CNA/MA  January 13, 2010 9:48 AM

## 2010-04-09 NOTE — Procedures (Signed)
Summary: EGD   EGD  Procedure date:  12/14/2007  Findings:      Location: [facility]    Patient Name: Alexandria [name], Alexandria [name] MRN: [id number] Procedure Procedures: Panendoscopy (EGD) CPT: 43235.    with balloon dilation. CPT: T9508883.  Personnel: Endoscopist: Rachael Fee, MD.  Exam Location: Exam performed in Endoscopy Suite. Outpatient  Patient Consent: Procedure, Alternatives, Risks and Benefits discussed, consent obtained, from patient. Consent was obtained by the RN.  Indications Symptoms: Dysphagia.  Comments: intermittent dysphagia, intermittent choking; UGI showed cervical and lower esophageal narrowing; MBS shows she swallows safely History  Current Medications: Patient is not currently taking Coumadin.  Comments: Patient history reviewed/updated, physical exam performed prior to initiation of sedation? yes Pre-Exam Physical: Performed Dec 14, 2007  Cardio-pulmonary exam, Abdominal exam, Mental status exam WNL.  Comments: Pt. history reviewed/updated, physical exam performed prior to initiation of sedation? yes Exam Exam Info: Maximum depth of insertion Duodenum, intended Duodenum. Patient position: on left side. Gastric retroflexion performed. Images taken. ASA Classification: II. Tolerance: good.  Sedation Meds: Patient assessed and found to be appropriate for moderate (conscious) sedation. Fentanyl 50 mcg. given IV. Versed 6 mg. given IV.  Monitoring: BP and pulse monitoring done. Oximetry used. Supplemental O2 given  Findings - Normal: Proximal Esophagus to Duodenal 2nd Portion. Comments: otherwise normal examination.   Cervical esophagus narrowing was not appreciated.  STRICTURE / STENOSIS: Stenosis in Distal Esophagus.  Comment: mild narrowing of distal esophagus, mucosa was normal.  This narrowing only slightly held up EGD scope passage into stomach and was dilated with a 20mm CRE TTS balloon held inflated for 1 minute.   Assessment:  Cervical esophagus narrowing seen on barium UGI was not appreciated on this examination.  CT scan neck/chest showed no sign of tumor causing compression.  Perhaps this finding on UGI was simply a snug cricopharygous muscle.  The distal esophagus was slighlty narrowed and was therefore dilated up to 20mm with a TTS balloon.  Will observe for clinical response.  She will chew her food carefully and return to see me in the office in 4-5 weeks.  My office will call her to arrange this.    Appended Document: EGD please arrange for rov in 4-5 weeks  Appended Document: EGD 01/08/08 appt made pt aware

## 2010-04-09 NOTE — Progress Notes (Signed)
Summary: Rx generic Norvasc  Phone Note Call from Patient Call back at [phone] or [phone]   Caller: Patient Call For: Dr. Hetty Ely Summary of Call: Pt has run out of her BP medcation and needs a 7 day supply for generic Norvasc [facility]  [facility] is going to pay for this until she get the rx from them. Initial call taken by: Sydell Axon,  August 09, 2007 11:16 AM  Follow-up for Phone Call        Pt notified that rx has been sent to pharmacy. Follow-up by: Sydell Axon,  August 09, 2007 2:26 PM      Prescriptions: NORVASC 5 MG TABS (AMLODIPINE BESYLATE) 1 daily by mouth  #7 x 0   Entered and Authorized by:   Shaune Leeks MD   Signed by:   Shaune Leeks MD on 08/09/2007   Method used:   [facility]       [facility]  [address] [phone]*       [address]       [place], [place]  [address]       Ph: [phone]       Fax: [phone]   RxID:   [id number]  Shaune Leeks MD  August 09, 2007 2:14 PM

## 2010-04-09 NOTE — Progress Notes (Signed)
Summary: PROBLEMS   Phone Note Call from Patient Call back at Home Phone [phone] Call back at cell [phone]   Caller: SELF Call For: Excell Neyland Summary of Call: PT HAS CHEST PAIN ON EXERTION AND SOB FOR THE PAST 3 WEEKS-WOULD LIKE TO BE SEEN TODAY AND WOULD LIKE AN ECHO TO RULE OUT BLOCKAGES-I ADVISED HER THAT WE DO NOT HAVE AVAILABILITY TODAY AND THAT DUE TO HER SYMPTOMS LASTING FOR 3 WEEKS AND THE FACT THAT SHE HAS HAD AN EKG AND LABWORK TO RULE OUT ANYTHING REGARDING THIS (WHICH ALL CAME BACK NORMAL) THAT HER SYMPTOMS WERE NOT EMERGENT-I OFFERED OUR FIRST AVAILABLE APPT (NOV 30) AND TOLD HER THAT THE NURSE WOULD RETURN HER CALL AND IF IT WAS FELT BY THE NURSE THAT SHE NEEDED TO BE SEEN SOONER THAT WE WOULD WORK HER IN. Initial call taken by: Harlon Flor,  January 22, 2010 9:33 AM  Follow-up for Phone Call        Called spoke with pt, advised pt Dr Sherene Sires did a thorough work-up checking EKG and cardiac enzymes, pt has been experiencing chest tightness that worsens with activity, some relief with Proair, band around chest bilaterally. Pt concerned HR increases with any activity and is greatly restricting her ADL's.  Pt concerned she may have "blockages" and would like to see a cariologist ASAP.  Next available appt is 02/04/10, but will call pt on cell # [phone] if sooner appt becomes available.  Instructed pt to go to ED if chest tightness worsens, persists even at rest, develops acute CP, nausea, vomiting, diaphorsis, incr SOB, pt does not want to go to ED but understands must go if these symptoms develop. Follow-up by: Cloyde Reams RN,  January 22, 2010 11:58 AM  Additional Follow-up for Phone Call Additional follow up Details #1::        Reviewing her chart, she would likely qualify for a treadmill study (not a myoview as there is no EKG changes, few risk factors and insurance would not cover).  does she want to be scheduled for a routine tteadmill study?     Appended Document:  PROBLEMS pt seen 02/04/10

## 2010-04-09 NOTE — Assessment & Plan Note (Signed)
Summary: 10:15  EAR,BODY ACHES/CLE   Vital Signs:  Patient profile:   75 year old female Height:      64 inches Weight:      207.25 pounds BMI:     35.70 Temp:     98.3 degrees F oral Pulse rate:   80 / minute Pulse rhythm:   regular BP sitting:   126 / 80  (left arm) Cuff size:   large  Vitals Entered By: Delilah Shan CMA Duncan Dull) (January 03, 2009 10:54 AM) CC: Ear and body aches, chest congestion   History of Present Illness: 76 yo with 10 day h/o: Nasal congestion, ear pressure, sinus pressure, and body aches. Subjective favers.  Taking Mucinex OTC with no relief of symptoms. Dry cough.  No nausea, vomiting, or diarrhea.  Current Medications (verified): 1)  Prozac 10 Mg Caps (Fluoxetine Hcl) .Marland Kitchen.. 1 Daily By Mouth 2)  Wellbutrin Sr 150 Mg Tb12 (Bupropion Hcl) .Marland Kitchen.. 1 Daily By Mouth 3)  Norvasc 5 Mg Tabs (Amlodipine Besylate) .Marland Kitchen.. 1 Daily By Mouth 4)  Spiriva Handihaler 18 Mcg Caps (Tiotropium Bromide Monohydrate) .... Inhale 1 Capsule By Mouth Once A Day 5)  Nexium 40 Mg Cpdr (Esomeprazole Magnesium) .Marland Kitchen.. 1 Daily By Mouth 6)  Advair Diskus 250-50 Mcg/dose Misc (Fluticasone-Salmeterol) .... Inhale 1 Puff Twice A Day 7)  Alprazolam 0.25 Mg Tabs (Alprazolam) .Marland Kitchen.. 1 Q6 Hrs Prn 8)  Flonase 50 Mcg/act  Susp (Fluticasone Propionate) .Marland Kitchen.. 1 Sp Ea Nostril Every Day 9)  Proair Hfa 108 (90 Base) Mcg/act  Aers (Albuterol Sulfate) .Marland Kitchen.. 1 Puff As Needed Up To Every 4 Hours 10)  Fexofenadine Hcl 30 Mg Tabs (Fexofenadine Hcl) .... Take 1 Tablet By Mouth Once A Day As Needed 11)  Synthroid 150 Mcg Tabs (Levothyroxine Sodium) .Marland Kitchen.. 1 Daily By Mouth 12)  Crestor 10 Mg Tabs (Rosuvastatin Calcium) .... Take 1 Tablet By Mouth At Bedtime  (Takes 3 /week) 13)  Vitamin D 04540 Unit Caps (Ergocalciferol) .Marland Kitchen.. 1 Tablet Weekly By Mouth 14)  Zetia 10 Mg Tabs (Ezetimibe) .... Take One By Mouth Daily 15)  Vitamin D (Ergocalciferol) 50000 Unit Caps (Ergocalciferol) .... Take One By Mouth Weekly 16)  Epipen  0.3 Mg/0.46ml Devi (Epinephrine) .... Use As Directed As Needed 17)  Coenzyme Q10 200 Mg Caps (Coenzyme Q10) .... Take 1 Tablet By Mouth Once A Day 18)  Lyrica 50 Mg Caps (Pregabalin) .... Take 1 Capsule By Mouth Once A Day 19)  Azithromycin 250 Mg  Tabs (Azithromycin) .... 2 By  Mouth Today and Then 1 Daily For 4 Days  Allergies: 1)  ! Codeine 2)  ! Sulfa 3)  ! * Biaxin  Review of Systems      See HPI General:  Complains of chills and fever. ENT:  Complains of earache, nasal congestion, postnasal drainage, and sinus pressure; denies sore throat. CV:  Denies chest pain or discomfort. Resp:  Complains of cough; denies shortness of breath, sputum productive, and wheezing.  Physical Exam  General:  Obese appearing female in NAD Head:  TTP over frontal sinuses bilaterally Ears:  External ear exam shows no significant lesions or deformities.  Otoscopic examination reveals clear canals, tympanic membranes are intact bilaterally without bulging, retraction, inflammation or discharge. Hearing is grossly normal bilaterally. Nose:  mucosal erythema and mucosal edema.   Mouth:  pharyngeal erythema.   Lungs:  Normal respiratory effort, chest expands symmetrically. Lungs are clear to auscultation, no crackles or wheezes. Good air movement today. Heart:  Normal rate  and regular rhythm. S1 and S2 normal without gallop, murmur, click, rub or other extra sounds.   Impression & Recommendations:  Problem # 1:  SINUSITIS, ACUTE (ICD-461.9) Assessment New Given duration of symptoms, will treat for bacterial sinusitis.  Continue supportive care.  See patient instructions for details. Her updated medication list for this problem includes:    Flonase 50 Mcg/act Susp (Fluticasone propionate) .Marland Kitchen... 1 sp ea nostril every day    Azithromycin 250 Mg Tabs (Azithromycin) .Marland Kitchen... 2 by  mouth today and then 1 daily for 4 days  Complete Medication List: 1)  Prozac 10 Mg Caps (Fluoxetine hcl) .Marland Kitchen.. 1 daily by mouth  2)  Wellbutrin Sr 150 Mg Tb12 (Bupropion hcl) .Marland Kitchen.. 1 daily by mouth 3)  Norvasc 5 Mg Tabs (Amlodipine besylate) .Marland Kitchen.. 1 daily by mouth 4)  Spiriva Handihaler 18 Mcg Caps (Tiotropium bromide monohydrate) .... Inhale 1 capsule by mouth once a day 5)  Nexium 40 Mg Cpdr (Esomeprazole magnesium) .Marland Kitchen.. 1 daily by mouth 6)  Advair Diskus 250-50 Mcg/dose Misc (Fluticasone-salmeterol) .... Inhale 1 puff twice a day 7)  Alprazolam 0.25 Mg Tabs (Alprazolam) .Marland Kitchen.. 1 q6 hrs prn 8)  Flonase 50 Mcg/act Susp (Fluticasone propionate) .Marland Kitchen.. 1 sp ea nostril every day 9)  Proair Hfa 108 (90 Base) Mcg/act Aers (Albuterol sulfate) .Marland Kitchen.. 1 puff as needed up to every 4 hours 10)  Fexofenadine Hcl 30 Mg Tabs (Fexofenadine hcl) .... Take 1 tablet by mouth once a day as needed 11)  Synthroid 150 Mcg Tabs (Levothyroxine sodium) .Marland Kitchen.. 1 daily by mouth 12)  Crestor 10 Mg Tabs (Rosuvastatin calcium) .... Take 1 tablet by mouth at bedtime  (takes 3 /week) 13)  Vitamin D 21308 Unit Caps (Ergocalciferol) .Marland Kitchen.. 1 tablet weekly by mouth 14)  Zetia 10 Mg Tabs (Ezetimibe) .... Take one by mouth daily 15)  Vitamin D (ergocalciferol) 50000 Unit Caps (Ergocalciferol) .... Take one by mouth weekly 16)  Epipen 0.3 Mg/0.40ml Devi (Epinephrine) .... Use as directed as needed 17)  Coenzyme Q10 200 Mg Caps (Coenzyme q10) .... Take 1 tablet by mouth once a day 18)  Lyrica 50 Mg Caps (Pregabalin) .... Take 1 capsule by mouth once a day 19)  Azithromycin 250 Mg Tabs (Azithromycin) .... 2 by  mouth today and then 1 daily for 4 days  Patient Instructions: 1)  Recommended voice rest for laryngitis, Warm honey tea.  Treat sympotmatically  with guafenesin, nasal saline irrigation.   2)  Use warm moist compresses. 3)   Call if no improvement in 5-7 days, sooner if increasing pain, fever, or new symptoms.  Prescriptions: AZITHROMYCIN 250 MG  TABS (AZITHROMYCIN) 2 by  mouth today and then 1 daily for 4 days  #6 x 0   Entered and Authorized by:   Ruthe Mannan MD   Signed by:   Ruthe Mannan MD on 01/03/2009   Method used:   [facility]        [facility]       [address]       [place], [place]  [address]       Ph: [phone] or [phone]       Fax: [phone]   RxID:   [id number]   Current Allergies (reviewed today): ! CODEINE ! SULFA ! Quita Skye

## 2010-04-09 NOTE — Letter (Signed)
Summary: Results Follow up Letter  [place] [facility]  [address] [place], [place] [address]   Phone: [phone]  Fax: [phone]    02/26/2010 MRN: [id number]    Alexandria [name] [address] [place], [place]  [address]    Dear [name],  The following are the results of your recent test(s):  Test         Result    Pap Smear:        Normal _____  Not Normal _____ Comments: ______________________________________________________ Cholesterol: LDL(Bad cholesterol):         Your goal is less than:         HDL (Good cholesterol):       Your goal is more than: Comments:  ______________________________________________________ Mammogram:        Normal ___X__  Not Normal _____ Comments:  Yearly follow up is recommended.   ___________________________________________________________________ Hemoccult:        Normal _____  Not normal _______ Comments:    _____________________________________________________________________ Other Tests:    We routinely do not discuss normal results over the telephone.  If you desire a copy of the results, or you have any questions about this information we can discuss them at your next office visit.   Sincerely,    Dwana Curd. Para March, M.D.  [facility]

## 2010-04-09 NOTE — Assessment & Plan Note (Signed)
History of Present Illness Visit Type: consult Primary GI MD: Rob Bunting MD Primary Provider: Everrett Coombe, P.A. Requesting Provider: Everrett Coombe, P.A. History of Present Illness:     very pleasant 75 year old woman who has had trouble with choking while eating, feels like it "goes down the wrong tube".  Rice, sesame seeds are the prime culprits.  Turning her neck makes it worse.  Also has mild dysphagia to solid foods, this seems to be a very distinct problem from choking.  The choking happens several times a month for 6 months.  probably goes back years however.  Had EGD for dysphagia (not choking) about 7 or 8 years ago.  Female doctor, done in [place] but she cannot recall who it was or what they found.  They performed dilation.  Gained 30 pounds in past 1 year, 60 pounds total in past 4 years.             Prior Medications Reviewed Using: List Brought by Patient  Updated Prior Medication List: PROZAC 10 MG CAPS (FLUOXETINE HCL) 1 daily by mouth WELLBUTRIN SR 150 MG TB12 (BUPROPION HCL) 1 daily by mouth NORVASC 5 MG TABS (AMLODIPINE BESYLATE) 1 daily by mouth SPIRIVA HANDIHALER 18 MCG CAPS (TIOTROPIUM BROMIDE MONOHYDRATE) Inhale 1 capsule by mouth once a day NEXIUM 40 MG CPDR (ESOMEPRAZOLE MAGNESIUM) 1 daily by mouth SYNTHROID 137 MCG TABS (LEVOTHYROXINE SODIUM) 1 daily by mouth ADVAIR DISKUS 250-50 MCG/DOSE MISC (FLUTICASONE-SALMETEROL) Inhale 1 puff twice a day ALPRAZOLAM 0.25 MG TABS (ALPRAZOLAM) 1 q6 hrs prn ALLEGRA 60 MG  TABS (FEXOFENADINE HCL) one tab by mouth two times a day prn FLONASE 50 MCG/ACT  SUSP (FLUTICASONE PROPIONATE) 1 SP EA NOSTRIL EVERY DAY PROAIR HFA 108 (90 BASE) MCG/ACT  AERS (ALBUTEROL SULFATE) 1 PUFF as needed up to every 4 hours  Current Allergies (reviewed today): ! CODEINE ! SULFA ! Quita Skye  Past Medical History:    Reviewed history from 11/07/2007 and no changes required:       Depression (03/08/1997)       Depression  (12/07/1998)       Hypothyroidism (03/08/1968)       Hypertension (03/08/1992)       Hyperlipidemia (10/04/2000)       Diabetes mellitus, type II (02/05/2006)       COPD (03/08/1998)       Current Problems:        VENTRAL HERNIA (ICD-553.20)       ESOPHAGITIS (ICD-530.10)       EDEMA (ICD-782.3)       SOB (ICD-786.05)       ALLERGY (ICD-995.3)       BRONCHITIS, OBSTRUCTIVE CHRONIC W/EXACRB (ICD-491.21)       ROTATOR CUFF SYNDROME, RIGHT (ICD-726.10)       SYNDROME, ROTATOR CUFF NOS (ICD-726.10)       OBESITY NOS (ICD-278.00)       NUMBNESS (ICD-782.0)       POSTMENOPAUSAL ON HORMONE REPLACEMENT THERAPY (ICD-V07.4)       COPD (ICD-496)       DIABETES MELLITUS, TYPE II (ICD-250.00)       HYPERLIPIDEMIA (ICD-272.4)       HYPERTENSION (ICD-401.9)       HYPOTHYROIDISM (ICD-244.9)       DEPRESSION (ICD-311)         Past Surgical History:    Reviewed history from 07/03/2007 and no changes required:       Tonsillectomy       Rotator cuff  repair R ([place]), 1984       C/S x 2 Breech/Repeat       Hysterectomy, total due to dysmennorhea, 1985        Cholecystectomy, 1997       Thumb release R, 12/1997       Carpal tunnel repair R 12/1997       EGD nml (due to hoarseness), 12/05/1997       Abd U/S nml x 2 foci in liver, 03/19/1999       CT Abd hemangiomas of liver  1cm R renal cyst       Cardiolite persantine nml, 08/24/2000       Carotid U/S 1-39% ICA stenosis, 08/24/2000       DEXA nml, 07/03/2003       Adenosine Myoview  nml 06/02/2007       Carotid U/S no apprec change  06/02/2007       Echo    06/02/2007   Family History:    no esophageal cancer    No known head and neck cancers.  Social History:    married, 2 children, she is retired, former smoker, drinks 2 alcoholic beverages per week, drinks 4 caffeinated beverages per day.    Review of Systems       Pertinent positive and negative review of systems were noted in the above HPI and GI specific review of systems.   All other review of systems was otherwise negative.    Vital Signs:  Patient Profile:   75 Years Old Female Height:     64.75 inches Weight:      229.50 pounds BMI:     38.63 BSA:     2.09 Pulse rate:   68 / minute Pulse rhythm:   regular BP sitting:   148 / 78  (left arm)  Vitals Entered By: Milford Cage CMA (November 08, 2007 10:34 AM)                  Physical Exam  Constitutional: generally well appearing Psychiatric: alert and oriented times 3 Eyes: extraocular movements intact Mouth: oropharynx moist, no lesions Neck: supple, no lymphadenopathy Cardiovascular: heart regular rate and rythm Lungs: CTA bilaterally Abdomen: soft, non-tender, non-distended, no obvious ascites, no peritoneal signs, normal bowel sounds Extremities: no lower extremity edema bilaterally Skin: no lesions on visible extremities     Impression & Recommendations:  Problem # 1:  CHOKING (ICD-933.1) She is fairly clear that she chokes several times a month. Very small particle foods seem to be the prime culprit. She feels like "these go down the wrong tube". This is a distinct symptom from dysphasia, which she also does complain of. To workup her choking I'll arrange for a modified barium swallow study with speech pathology.  Problem # 2:  DYSPHAGIA (ICD-787.29) mild intermittent solid food dysphasia for many years. Possibly GERD related. She will get an upper GI barium esophagram performed. If strictures or stenoses are found then followup EGD will be scheduled.   Patient Instructions: 1)  A copy of this information will be sent to DR. Hetty Ely, [facility]ie [facility]. 2)  You will be scheduled for a Modified Barium Swallow test with speech path to evaluate your intermittent choking. 3)  You will also be scheduled for Barium esophagram to evaluate your dsyphagia.    ]  Appended Document: Orders Update/Barium Esophogram    Clinical Lists Changes  Orders: Added new Test order of Barium  Swallow, Modified  (Modified BS) - Signed  Added new Test order of GI Misc Procedure/ Radiology Order (GI Misc ) - Signed

## 2010-04-09 NOTE — Assessment & Plan Note (Signed)
Summary: acute/swelling of feet and ankles/cmt   Vital Signs:  Patient Profile:   75 Years Old Female Weight:      229 pounds Temp:     98.8 degrees F tympanic Pulse rate:   76 / minute Pulse rhythm:   regular BP sitting:   120 / 70  (left arm) Cuff size:   large  Vitals Entered By: Providence Crosby (July 03, 2007 12:39 PM)                 Chief Complaint:  FETT AND LEGS SWELLING.  History of Present Illness: Here for swelling of feet and ankles for last few months. She has really been trying to live healthily lately, drinking lots of flavored bottled water which she just recently discovered has sodium in it which she knows is not good for her and will now change. She has flown to [place] in the last month and must fly back as herdaughter is having heart surgery to repair an ASD which she apparently has had all her life. Mrs Chretien has been having SOB for which she saw Cardiology and had Myoview, Carotid and Echo, all apparently ok. She is limiting her exertion because of SOB and we discussed avoiding doing that by progreesively increasing activity..."poor man's pulmonary rehab!"    Prior Medications Reviewed Using: Patient Recall  Current Allergies (reviewed today): ! CODEINE ! SULFA ! Quita Skye  Past Surgical History:    Tonsillectomy    Rotator cuff repair R ([place]), 1984    C/S x 2 Breech/Repeat    Hysterectomy, total due to dysmennorhea, 1985     Cholecystectomy, 1997    Thumb release R, 12/1997    Carpal tunnel repair R 12/1997    EGD nml (due to hoarseness), 12/05/1997    Abd U/S nml x 2 foci in liver, 03/19/1999    CT Abd hemangiomas of liver  1cm R renal cyst    Cardiolite persantine nml, 08/24/2000    Carotid U/S 1-39% ICA stenosis, 08/24/2000    DEXA nml, 07/03/2003    Adenosine Myoview  nml 06/02/2007    Carotid U/S no apprec change  06/02/2007    Echo    06/02/2007      Physical Exam  General:     Well-developed,well-nourished,in no acute distress;  alert,appropriate and cooperative throughout examination Head:     Normocephalic and atraumatic without obvious abnormalities. No apparent alopecia or balding. Eyes:     .CON  Ears:     External ear exam shows no significant lesions or deformities.  Otoscopic examination reveals clear canals, tympanic membranes are intact bilaterally without bulging, retraction, inflammation or discharge. Hearing is grossly normal bilaterally. Nose:     External nasal examination shows no deformity or inflammation. Nasal mucosa are pink and moist without lesions or exudates. Mouth:     Oral mucosa and oropharynx without lesions or exudates.  Teeth in good repair. Neck:     No deformities, masses, or tenderness noted. Chest Wall:     No deformities, masses, or tenderness noted. Lungs:     Normal respiratory effort, chest expands symmetrically. Lungs are clear to auscultation, no crackles or wheezes. No rhonchi heard today. Heart:     Normal rate and regular rhythm. S1 and S2 normal without gallop, murmur, click, rub or other extra sounds. Extremities:     1+ edema of feet and ankles.bilat with mild erythema.    Impression & Recommendations:  Problem # 1:  EDEMA (ICD-782.3) Assessment: New  Discussed hygiene...will try to avoid diuretic at this point. RTC if probs on her trip. Change bottled water she drinks.  Problem # 2:  SOB (ICD-786.05) Assessment: Unchanged Discussed rehab.  Problem # 3:  HYPERTENSION (ICD-401.9) Assessment: Unchanged Stable. Her updated medication list for this problem includes:    Norvasc 5 Mg Tabs (Amlodipine besylate) .Marland Kitchen... 1 daily by mouth  BP today: 120/70 Prior BP: 120/70 (05/23/2007)  Labs Reviewed: Creat: 1.0 (01/12/2007) Chol: 234 (01/12/2007)   HDL: 50.3 (01/12/2007)   LDL: DEL (01/12/2007)   TG: 126 (01/12/2007)   Complete Medication List: 1)  Prozac 10 Mg Caps (Fluoxetine hcl) .Marland Kitchen.. 1 daily by mouth 2)  Wellbutrin Sr 150 Mg Tb12 (Bupropion hcl) .Marland Kitchen.. 1  daily by mouth 3)  Norvasc 5 Mg Tabs (Amlodipine besylate) .Marland Kitchen.. 1 daily by mouth 4)  Spiriva Handihaler 18 Mcg Caps (Tiotropium bromide monohydrate) .... Inhale 1 capsule by mouth once a day 5)  Nexium 40 Mg Cpdr (Esomeprazole magnesium) .Marland Kitchen.. 1 daily by mouth 6)  Synthroid 137 Mcg Tabs (Levothyroxine sodium) .Marland Kitchen.. 1 daily by mouth 7)  Advair Diskus 250-50 Mcg/dose Misc (Fluticasone-salmeterol) .... Inhale 1 puff twice a day 8)  Albuterol 90 Mcg/act Aers (Albuterol) .... Inhale 1 puff every four to six hours 9)  Alprazolam 0.25 Mg Tabs (Alprazolam) .Marland Kitchen.. 1 q6 hrs prn 10)  Allegra 60 Mg Tabs (Fexofenadine hcl) .... One tab by mouth two times a day prn   Patient Instructions: 1)  RTC upon return if swelling continues.    ]

## 2010-04-09 NOTE — Progress Notes (Signed)
Summary: PSS Positive, needs referral to sleep doc here-lmtcb x 1  Phone Note Outgoing Call   Summary of Call: call and let her know she does have sleep apnea but not that bad, would like her to see one our sleep specialists next  (set up with one of our docs) Initial call taken by: Nyoka Cowden MD,  February 05, 2010 12:22 PM  Follow-up for Phone Call        [facility] Vernie Murders  February 05, 2010 12:26 PM  pt set to see VS on 02-09-10 at 10:15. pt demanded appt for monday. OK per LC to double book. Carron Curie CMA  February 05, 2010 3:45 PM

## 2010-04-09 NOTE — Letter (Signed)
Summary: Dr.[facility],Note  Dr.Matthew [facility],Note   Imported By: Beau Fanny 12/25/2008 09:41:06  _____________________________________________________________________  External Attachment:    Type:   Image     Comment:   External Document

## 2010-04-10 NOTE — Letter (Signed)
Summary: [facility]  [facility]   Imported By: Lanelle Bal 12/04/2009 14:09:17  _____________________________________________________________________  External Attachment:    Type:   Image     Comment:   External Document  Appended Document: [facility] Endocrinology [facility] Diabetes     Clinical Lists Changes  Observations: Added new observation of PAST MED HX: Depression (12/07/1998) Hypothyroidism (03/08/1968) Hypertension (03/08/1992) Hyperlipidemia (10/04/2000) Diabetes mellitus, type II (02/05/2006)- Dr. Leslie Dales with endo COPD (03/08/1998)    - PFT"s 12/12/2002  FEV1 1.42 (64%) ratio 58 with no better after B2 and DLCO75%    - PFT's  9/ 15/11    FEV1  .150 (73%) ratio 50 no better after B2 with DLC0 62%    - hfa 50% November 20, 2009  Current Problems:  VENTRAL HERNIA (ICD-553.20) ESOPHAGITIS (ICD-530.10) EDEMA (ICD-782.3) SOB (ICD-786.05) ALLERGY (ICD-995.3) BRONCHITIS, OBSTRUCTIVE CHRONIC W/EXACRB (ICD-491.21) ROTATOR CUFF SYNDROME, RIGHT (ICD-726.10) SYNDROME, ROTATOR CUFF NOS (ICD-726.10) OBESITY NOS (ICD-278.00) NUMBNESS (ICD-782.0) POSTMENOPAUSAL ON HORMONE REPLACEMENT THERAPY (ICD-V07.4) COPD (ICD-496) DIABETES MELLITUS, TYPE II (ICD-250.00) HYPERLIPIDEMIA (ICD-272.4) HYPERTENSION (ICD-401.9) HYPOTHYROIDISM (ICD-244.9) DEPRESSION (ICD-311)   (12/04/2009 22:35)       Past History:  Past Medical History: Depression (12/07/1998) Hypothyroidism (03/08/1968) Hypertension (03/08/1992) Hyperlipidemia (10/04/2000) Diabetes mellitus, type II (02/05/2006)- Dr. Leslie Dales with endo COPD (03/08/1998)    - PFT"s 12/12/2002  FEV1 1.42 (64%) ratio 58 with no better after B2 and DLCO75%    - PFT's  9/ 15/11    FEV1  .150 (73%) ratio 50 no better after B2 with DLC0 62%    - hfa 50% November 20, 2009  Current Problems:  VENTRAL HERNIA (ICD-553.20) ESOPHAGITIS (ICD-530.10) EDEMA (ICD-782.3) SOB  (ICD-786.05) ALLERGY (ICD-995.3) BRONCHITIS, OBSTRUCTIVE CHRONIC W/EXACRB (ICD-491.21) ROTATOR CUFF SYNDROME, RIGHT (ICD-726.10) SYNDROME, ROTATOR CUFF NOS (ICD-726.10) OBESITY NOS (ICD-278.00) NUMBNESS (ICD-782.0) POSTMENOPAUSAL ON HORMONE REPLACEMENT THERAPY (ICD-V07.4) COPD (ICD-496) DIABETES MELLITUS, TYPE II (ICD-250.00) HYPERLIPIDEMIA (ICD-272.4) HYPERTENSION (ICD-401.9) HYPOTHYROIDISM (ICD-244.9) DEPRESSION (ICD-311)

## 2010-04-15 NOTE — Progress Notes (Signed)
Summary: alprazolam  Phone Note Refill Request Message from:  Fax from Pharmacy on April 09, 2010 1:39 PM  Refills Requested: Medication #1:  ALPRAZOLAM 0.25 MG TABS Take one by mouth every 6 hours as needed   Last Refilled: 12/24/2009 Refill request from [facility]. [phone].   Initial call taken by: Melody Comas,  April 09, 2010 1:39 PM  Follow-up for Phone Call        please call in to [facility], not [facility].  thanks.  Follow-up by: Crawford Givens MD,  April 09, 2010 1:49 PM  Additional Follow-up for Phone Call Additional follow up Details #1::        Medication phoned to pharmacy.  Additional Follow-up by: Delilah Shan CMA (AAMA),  April 09, 2010 3:24 PM    Prescriptions: ALPRAZOLAM 0.25 MG TABS (ALPRAZOLAM) Take one by mouth every 6 hours as needed  #60 x 0   Entered and Authorized by:   Crawford Givens MD   Signed by:   Crawford Givens MD on 04/09/2010   Method used:   Telephoned to ...       [facility]* (retail)             ,          Ph: [phone]       Fax: [phone]   RxID:   [id number]

## 2010-04-15 NOTE — Letter (Signed)
Summary: Dipyridamole Stress Cardiolite Study   Dipyridamole Stress Cardiolite Study   Imported By: Roderic Ovens 04/08/2010 09:55:44  _____________________________________________________________________  External Attachment:    Type:   Image     Comment:   External Document

## 2010-05-08 ENCOUNTER — Ambulatory Visit (HOSPITAL_BASED_OUTPATIENT_CLINIC_OR_DEPARTMENT_OTHER)
Admission: RE | Admit: 2010-05-08 | Discharge: 2010-05-09 | Disposition: A | Discharge status: Home / Self Care | Payer: BC Managed Care – PPO | Attending: Orthopedic Surgery | Admitting: Orthopedic Surgery

## 2010-05-08 DIAGNOSIS — M66329 Spontaneous rupture of flexor tendons, unspecified upper arm: Secondary | ICD-10-CM | POA: Insufficient documentation

## 2010-05-08 DIAGNOSIS — M19019 Primary osteoarthritis, unspecified shoulder: Secondary | ICD-10-CM | POA: Insufficient documentation

## 2010-05-08 DIAGNOSIS — Z01812 Encounter for preprocedural laboratory examination: Secondary | ICD-10-CM | POA: Insufficient documentation

## 2010-05-08 DIAGNOSIS — Z01810 Encounter for preprocedural cardiovascular examination: Secondary | ICD-10-CM | POA: Insufficient documentation

## 2010-05-08 DIAGNOSIS — M67919 Unspecified disorder of synovium and tendon, unspecified shoulder: Secondary | ICD-10-CM | POA: Insufficient documentation

## 2010-05-08 DIAGNOSIS — M719 Bursopathy, unspecified: Secondary | ICD-10-CM | POA: Insufficient documentation

## 2010-05-08 DIAGNOSIS — M24119 Other articular cartilage disorders, unspecified shoulder: Secondary | ICD-10-CM | POA: Insufficient documentation

## 2010-05-08 LAB — POCT I-STAT 4, (NA,K, GLUC, HGB,HCT)
Glucose, Bld: 129 mg/dL — ABNORMAL HIGH (ref 70–99)
HCT: 38 % (ref 36.0–46.0)
Hemoglobin: 12.9 g/dL (ref 12.0–15.0)
Potassium: 3.9 mEq/L (ref 3.5–5.1)
Sodium: 143 mEq/L (ref 135–145)

## 2010-05-09 LAB — GLUCOSE, CAPILLARY: Glucose-Capillary: 145 mg/dL — ABNORMAL HIGH (ref 70–99)

## 2010-05-14 NOTE — Op Note (Signed)
NAMELAYANN, BLUETT NO.:  [id number]  MEDICAL RECORD NO.:  [id number]           PATIENT TYPE:  LOCATION:                                 FACILITY:  PHYSICIAN:  Marlowe Kays, M.D.  DATE OF BIRTH:  11/06/1935  DATE OF PROCEDURE:  05/08/2010 DATE OF DISCHARGE:                              OPERATIVE REPORT   PREOPERATIVE DIAGNOSES: 1. Complete rotator cuff tear. 2. Moderate to severe tendinosis of the infraspinous and     subscapularis. 3. AC joint arthritis with involvement of rotator cuff. 4. Labral tear. 5. Suspected partial intra-articular biceps tendon tear.  POSTOPERATIVE DIAGNOSES: 1. Complete rotator cuff tear. 2. Moderate to severe tendinosis of the infraspinous and     subscapularis. 3. AC joint arthritis with involvement of rotator cuff. 4. Labral tear. 5. Suspected partial intra-articular biceps tendon tear.  OPERATION: 1. Right shoulder arthroscopy with labral debridement and debridement     of partial long head of biceps tendon tear. 2. Open distal clavicle resection. 3. Open anterior acromionectomy with repair torn rotator cuff.  SURGEON:  Marlowe Kays, M.D.  ASSISTANTDruscilla Brownie. Cherlynn June.  ANESTHESIA:  General.  JUSTIFICATION FOR PROCEDURE:  I had previously repaired her shoulder back in 1989 and she had done well until several months ago when she developed progressive pain in her right shoulder.  An MRI has demonstrated preoperative diagnoses and consequently she is here for the above-mentioned surgery.  DESCRIPTION OF PROCEDURE:  Prophylactic antibiotics, satisfactory general anesthesia, beach-chair position on the sliding frame, right shoulder girdle was prepped with DuraPrep, draped in sterile field. Time-out performed.  Anatomic landmarks in shoulder were marked out and after injecting the proposed surgical incision, I also injected the posterior portal and atraumatically entered the glenohumeral joint with a  finding of a good bit of disruption of the joint which initially made visualization a little difficult.  The labrum was identified as being badly degenerated.  There appeared to be a stump of the biceps tendon at the labrum present and it was little hard to define more superiorly, the integrity of the biceps tendon initially.  I advanced the scope in the usual interval between what would have been the biceps tendon and subscapularis and used a swishing stick and made an anterior incision over which I placed a metal cannula followed by 4.2 shaver.  I then debrided down the labrum and the stump of the biceps tendon and it did appear that there was probably 50% integrity to the midportion of the biceps tendon.  The superior portion had not retracted.  I then made an open incision over the distal clavicle identifying the [facility] joint and undermined the clavicle 0.5 cm medial to this.  I then made placed baby Homans there and used a microsaw to amputate the distal clavicle, removing it with towel clip and cautery technique.  There were no residual fragments on the apparent clavicle which I covered with bone wax.  I then advanced my incision lateral ward and with cautery dissection took the fascia off the anterior acromion which I undermined with a small Cobb elevator followed by  larger Cobb elevator and then made several passes with a microsaw to thoroughly decompress the subacromial space.  Along the way, multiple Tycron sutures from prior surgery were removed.  She had a very thick subdeltoid bursa adherent to the rotator cuff which I debrided off, finding roughly 1 cm tear as depicted on the MRI at the insertion of the supraspinatus tendon but also multiple what appeared to be small full-thickness perforations throughout the remainder of the rotator cuff.  I first tacked down the major rotator cuff tear with a four-strand Stryker anchor supplementing it with sutures over the lateral tail of this  rotator cuff into the lateral humeral tissues.  I then made multiple passes with the same suture through what appeared to be small slits in the rotator cuff numbering close to half a dozen.  I followed this by injecting indigo blue dye mixed with saline through the posterior capsule and there was no leakage of dye through the rotator cuff indicating competency.  I then irrigated the wound well with sterile saline.  We closed the wound after placing Gelfoam in the resection site of distal clavicle with interrupted #1 Vicryl in the deltoid muscle and fascia over the anterior acromion and distal clavicle, 2-0 Vicryl subcutaneously superficially as well as one 3-0 Vicryl and Steri-Strips on the major incision and 4-0 nylon on the portals.  Betadine, Adaptic were then applied to them; dry sterile dressing, shoulder immobilizer to the shoulder.  She tolerated the procedure well, was taken to the recovery room in satisfactory vision with no known complications and minimal blood loss.          ______________________________ Marlowe Kays, M.D.     JA/MEDQ  D:  05/08/2010  T:  05/08/2010  Job:  [id number]  Electronically Signed by Marlowe Kays M.D. on 05/13/2010 12:58:11 PM

## 2010-06-05 ENCOUNTER — Other Ambulatory Visit: Payer: Self-pay | Admitting: Family Medicine

## 2010-06-09 LAB — COMPREHENSIVE METABOLIC PANEL WITH GFR
ALT: 31 U/L (ref 0–35)
ALT: 33 U/L (ref 0–35)
AST: 13 U/L (ref 0–37)
AST: 24 U/L (ref 0–37)
Albumin: 2.5 g/dL — ABNORMAL LOW (ref 3.5–5.2)
Albumin: 2.6 g/dL — ABNORMAL LOW (ref 3.5–5.2)
Alkaline Phosphatase: 116 U/L (ref 39–117)
Alkaline Phosphatase: 124 U/L — ABNORMAL HIGH (ref 39–117)
BUN: 14 mg/dL (ref 6–23)
BUN: 25 mg/dL — ABNORMAL HIGH (ref 6–23)
CO2: 19 meq/L (ref 19–32)
CO2: 22 meq/L (ref 19–32)
Calcium: 8.6 mg/dL (ref 8.4–10.5)
Calcium: 8.7 mg/dL (ref 8.4–10.5)
Chloride: 107 meq/L (ref 96–112)
Chloride: 109 meq/L (ref 96–112)
Creatinine, Ser: 0.7 mg/dL (ref 0.4–1.2)
Creatinine, Ser: 0.87 mg/dL (ref 0.4–1.2)
GFR calc non Af Amer: >60 mL/min
GFR calc non Af Amer: >60 mL/min
Glucose, Bld: 137 mg/dL — ABNORMAL HIGH (ref 70–99)
Glucose, Bld: 157 mg/dL — ABNORMAL HIGH (ref 70–99)
Potassium: 4.2 meq/L (ref 3.5–5.1)
Potassium: 4.3 meq/L (ref 3.5–5.1)
Sodium: 136 meq/L (ref 135–145)
Sodium: 136 meq/L (ref 135–145)
Total Bilirubin: 0.3 mg/dL (ref 0.3–1.2)
Total Bilirubin: 0.4 mg/dL (ref 0.3–1.2)
Total Protein: 5.5 g/dL — ABNORMAL LOW (ref 6.0–8.3)
Total Protein: 6.3 g/dL (ref 6.0–8.3)

## 2010-06-09 LAB — COMPREHENSIVE METABOLIC PANEL
ALT: 46 U/L — ABNORMAL HIGH (ref 0–35)
AST: 13 U/L (ref 0–37)
Albumin: 2.7 g/dL — ABNORMAL LOW (ref 3.5–5.2)
Alkaline Phosphatase: 120 U/L — ABNORMAL HIGH (ref 39–117)
BUN: 34 mg/dL — ABNORMAL HIGH (ref 6–23)
CO2: 17 mEq/L — ABNORMAL LOW (ref 19–32)
Calcium: 8.7 mg/dL (ref 8.4–10.5)
Chloride: 111 mEq/L (ref 96–112)
Creatinine, Ser: 0.85 mg/dL (ref 0.4–1.2)
GFR calc Af Amer: >60 mL/min (ref >60)
GFR calc non Af Amer: >60 mL/min (ref >60)
Glucose, Bld: 88 mg/dL (ref 70–99)
Potassium: 5 mEq/L (ref 3.5–5.1)
Sodium: 135 mEq/L (ref 135–145)
Total Bilirubin: 0.4 mg/dL (ref 0.3–1.2)
Total Protein: 6.1 g/dL (ref 6.0–8.3)

## 2010-06-09 LAB — GLUCOSE, CAPILLARY
Glucose-Capillary: 107 mg/dL — ABNORMAL HIGH (ref 70–99)
Glucose-Capillary: 109 mg/dL — ABNORMAL HIGH (ref 70–99)
Glucose-Capillary: 110 mg/dL — ABNORMAL HIGH (ref 70–99)
Glucose-Capillary: 110 mg/dL — ABNORMAL HIGH (ref 70–99)
Glucose-Capillary: 112 mg/dL — ABNORMAL HIGH (ref 70–99)
Glucose-Capillary: 112 mg/dL — ABNORMAL HIGH (ref 70–99)
Glucose-Capillary: 114 mg/dL — ABNORMAL HIGH (ref 70–99)
Glucose-Capillary: 114 mg/dL — ABNORMAL HIGH (ref 70–99)
Glucose-Capillary: 114 mg/dL — ABNORMAL HIGH (ref 70–99)
Glucose-Capillary: 115 mg/dL — ABNORMAL HIGH (ref 70–99)
Glucose-Capillary: 115 mg/dL — ABNORMAL HIGH (ref 70–99)
Glucose-Capillary: 115 mg/dL — ABNORMAL HIGH (ref 70–99)
Glucose-Capillary: 116 mg/dL — ABNORMAL HIGH (ref 70–99)
Glucose-Capillary: 117 mg/dL — ABNORMAL HIGH (ref 70–99)
Glucose-Capillary: 119 mg/dL — ABNORMAL HIGH (ref 70–99)
Glucose-Capillary: 121 mg/dL — ABNORMAL HIGH (ref 70–99)
Glucose-Capillary: 123 mg/dL — ABNORMAL HIGH (ref 70–99)
Glucose-Capillary: 125 mg/dL — ABNORMAL HIGH (ref 70–99)
Glucose-Capillary: 125 mg/dL — ABNORMAL HIGH (ref 70–99)
Glucose-Capillary: 126 mg/dL — ABNORMAL HIGH (ref 70–99)
Glucose-Capillary: 126 mg/dL — ABNORMAL HIGH (ref 70–99)
Glucose-Capillary: 128 mg/dL — ABNORMAL HIGH (ref 70–99)
Glucose-Capillary: 129 mg/dL — ABNORMAL HIGH (ref 70–99)
Glucose-Capillary: 129 mg/dL — ABNORMAL HIGH (ref 70–99)
Glucose-Capillary: 130 mg/dL — ABNORMAL HIGH (ref 70–99)
Glucose-Capillary: 132 mg/dL — ABNORMAL HIGH (ref 70–99)
Glucose-Capillary: 132 mg/dL — ABNORMAL HIGH (ref 70–99)
Glucose-Capillary: 133 mg/dL — ABNORMAL HIGH (ref 70–99)
Glucose-Capillary: 134 mg/dL — ABNORMAL HIGH (ref 70–99)
Glucose-Capillary: 134 mg/dL — ABNORMAL HIGH (ref 70–99)
Glucose-Capillary: 134 mg/dL — ABNORMAL HIGH (ref 70–99)
Glucose-Capillary: 134 mg/dL — ABNORMAL HIGH (ref 70–99)
Glucose-Capillary: 135 mg/dL — ABNORMAL HIGH (ref 70–99)
Glucose-Capillary: 136 mg/dL — ABNORMAL HIGH (ref 70–99)
Glucose-Capillary: 136 mg/dL — ABNORMAL HIGH (ref 70–99)
Glucose-Capillary: 138 mg/dL — ABNORMAL HIGH (ref 70–99)
Glucose-Capillary: 139 mg/dL — ABNORMAL HIGH (ref 70–99)
Glucose-Capillary: 139 mg/dL — ABNORMAL HIGH (ref 70–99)
Glucose-Capillary: 139 mg/dL — ABNORMAL HIGH (ref 70–99)
Glucose-Capillary: 142 mg/dL — ABNORMAL HIGH (ref 70–99)
Glucose-Capillary: 142 mg/dL — ABNORMAL HIGH (ref 70–99)
Glucose-Capillary: 146 mg/dL — ABNORMAL HIGH (ref 70–99)
Glucose-Capillary: 148 mg/dL — ABNORMAL HIGH (ref 70–99)
Glucose-Capillary: 150 mg/dL — ABNORMAL HIGH (ref 70–99)

## 2010-06-09 LAB — BASIC METABOLIC PANEL
BUN: 24 mg/dL — ABNORMAL HIGH (ref 6–23)
CO2: 21 mEq/L (ref 19–32)
Calcium: 8.6 mg/dL (ref 8.4–10.5)
Chloride: 108 mEq/L (ref 96–112)
Creatinine, Ser: 0.82 mg/dL (ref 0.4–1.2)
GFR calc Af Amer: >60 mL/min (ref >60)
GFR calc non Af Amer: >60 mL/min (ref >60)
Glucose, Bld: 137 mg/dL — ABNORMAL HIGH (ref 70–99)
Potassium: 4.3 mEq/L (ref 3.5–5.1)
Sodium: 135 mEq/L (ref 135–145)

## 2010-06-09 LAB — CBC
HCT: 32.6 % — ABNORMAL LOW (ref 36.0–46.0)
HCT: 34 % — ABNORMAL LOW (ref 36.0–46.0)
HCT: 34.3 % — ABNORMAL LOW (ref 36.0–46.0)
HCT: 36 % (ref 36.0–46.0)
HCT: 37.2 % (ref 36.0–46.0)
Hemoglobin: 11.2 g/dL — ABNORMAL LOW (ref 12.0–15.0)
Hemoglobin: 11.8 g/dL — ABNORMAL LOW (ref 12.0–15.0)
Hemoglobin: 12 g/dL (ref 12.0–15.0)
Hemoglobin: 12.5 g/dL (ref 12.0–15.0)
Hemoglobin: 12.8 g/dL (ref 12.0–15.0)
MCHC: 34.3 g/dL (ref 30.0–36.0)
MCHC: 34.4 g/dL (ref 30.0–36.0)
MCHC: 34.5 g/dL (ref 30.0–36.0)
MCHC: 34.7 g/dL (ref 30.0–36.0)
MCHC: 34.9 g/dL (ref 30.0–36.0)
MCV: 89.8 fL (ref 78.0–100.0)
MCV: 89.8 fL (ref 78.0–100.0)
MCV: 90 fL (ref 78.0–100.0)
MCV: 90 fL (ref 78.0–100.0)
MCV: 90.2 fL (ref 78.0–100.0)
Platelets: 320 10*3/uL (ref 150–400)
Platelets: 321 10*3/uL (ref 150–400)
Platelets: 322 K/uL (ref 150–400)
Platelets: 328 10*3/uL (ref 150–400)
Platelets: 347 K/uL (ref 150–400)
RBC: 3.62 MIL/uL — ABNORMAL LOW (ref 3.87–5.11)
RBC: 3.77 MIL/uL — ABNORMAL LOW (ref 3.87–5.11)
RBC: 3.82 MIL/uL — ABNORMAL LOW (ref 3.87–5.11)
RBC: 4.01 MIL/uL (ref 3.87–5.11)
RBC: 4.13 MIL/uL (ref 3.87–5.11)
RDW: 14.4 % (ref 11.5–15.5)
RDW: 14.4 % (ref 11.5–15.5)
RDW: 14.6 % (ref 11.5–15.5)
RDW: 14.6 % (ref 11.5–15.5)
RDW: 14.8 % (ref 11.5–15.5)
WBC: 10.7 10*3/uL — ABNORMAL HIGH (ref 4.0–10.5)
WBC: 10.7 K/uL — ABNORMAL HIGH (ref 4.0–10.5)
WBC: 12.5 10*3/uL — ABNORMAL HIGH (ref 4.0–10.5)
WBC: 13.5 K/uL — ABNORMAL HIGH (ref 4.0–10.5)
WBC: 8.6 10*3/uL (ref 4.0–10.5)

## 2010-06-09 LAB — PHOSPHORUS
Phosphorus: 4 mg/dL (ref 2.3–4.6)
Phosphorus: 4.2 mg/dL (ref 2.3–4.6)
Phosphorus: 4.3 mg/dL (ref 2.3–4.6)

## 2010-06-09 LAB — BASIC METABOLIC PANEL WITH GFR
BUN: 41 mg/dL — ABNORMAL HIGH (ref 6–23)
CO2: 17 meq/L — ABNORMAL LOW (ref 19–32)
Calcium: 9.1 mg/dL (ref 8.4–10.5)
Chloride: 108 meq/L (ref 96–112)
Creatinine, Ser: 1.29 mg/dL — ABNORMAL HIGH (ref 0.4–1.2)
GFR calc non Af Amer: 41 mL/min — ABNORMAL LOW
Glucose, Bld: 149 mg/dL — ABNORMAL HIGH (ref 70–99)
Potassium: 4.6 meq/L (ref 3.5–5.1)
Sodium: 134 meq/L — ABNORMAL LOW (ref 135–145)

## 2010-06-09 LAB — DIFFERENTIAL
Basophils Absolute: 0 K/uL (ref 0.0–0.1)
Basophils Relative: 0 % (ref 0–1)
Eosinophils Absolute: 0.4 K/uL (ref 0.0–0.7)
Eosinophils Relative: 5 % (ref 0–5)
Lymphocytes Relative: 22 % (ref 12–46)
Lymphs Abs: 1.9 K/uL (ref 0.7–4.0)
Monocytes Absolute: 1 K/uL (ref 0.1–1.0)
Monocytes Relative: 11 % (ref 3–12)
Neutro Abs: 5.3 K/uL (ref 1.7–7.7)
Neutrophils Relative %: 62 % (ref 43–77)

## 2010-06-09 LAB — PREALBUMIN
Prealbumin: 11 mg/dL — ABNORMAL LOW (ref 18.0–45.0)
Prealbumin: 12.1 mg/dL — ABNORMAL LOW (ref 18.0–45.0)

## 2010-06-09 LAB — MAGNESIUM
Magnesium: 1.8 mg/dL (ref 1.5–2.5)
Magnesium: 1.9 mg/dL (ref 1.5–2.5)
Magnesium: 2.2 mg/dL (ref 1.5–2.5)

## 2010-06-09 LAB — TRIGLYCERIDES
Triglycerides: 108 mg/dL
Triglycerides: 109 mg/dL

## 2010-06-09 LAB — CLOSTRIDIUM DIFFICILE EIA

## 2010-06-09 LAB — CHOLESTEROL, TOTAL: Cholesterol: 101 mg/dL (ref 0–200)

## 2010-06-10 LAB — CBC
HCT: 32.5 % — ABNORMAL LOW (ref 36.0–46.0)
HCT: 32.5 % — ABNORMAL LOW (ref 36.0–46.0)
HCT: 33.9 % — ABNORMAL LOW (ref 36.0–46.0)
HCT: 35 % — ABNORMAL LOW (ref 36.0–46.0)
HCT: 37 % (ref 36.0–46.0)
HCT: 37.7 % (ref 36.0–46.0)
HCT: 39.7 % (ref 36.0–46.0)
Hemoglobin: 11.3 g/dL — ABNORMAL LOW (ref 12.0–15.0)
Hemoglobin: 11.3 g/dL — ABNORMAL LOW (ref 12.0–15.0)
Hemoglobin: 11.6 g/dL — ABNORMAL LOW (ref 12.0–15.0)
Hemoglobin: 11.8 g/dL — ABNORMAL LOW (ref 12.0–15.0)
Hemoglobin: 12.8 g/dL (ref 12.0–15.0)
Hemoglobin: 12.8 g/dL (ref 12.0–15.0)
Hemoglobin: 13.5 g/dL (ref 12.0–15.0)
MCHC: 33.9 g/dL (ref 30.0–36.0)
MCHC: 33.9 g/dL (ref 30.0–36.0)
MCHC: 34 g/dL (ref 30.0–36.0)
MCHC: 34.3 g/dL (ref 30.0–36.0)
MCHC: 34.5 g/dL (ref 30.0–36.0)
MCHC: 34.7 g/dL (ref 30.0–36.0)
MCHC: 34.8 g/dL (ref 30.0–36.0)
MCV: 90.1 fL (ref 78.0–100.0)
MCV: 90.4 fL (ref 78.0–100.0)
MCV: 90.4 fL (ref 78.0–100.0)
MCV: 90.7 fL (ref 78.0–100.0)
MCV: 90.9 fL (ref 78.0–100.0)
MCV: 91.4 fL (ref 78.0–100.0)
MCV: 91.8 fL (ref 78.0–100.0)
Platelets: 257 10*3/uL (ref 150–400)
Platelets: 258 10*3/uL (ref 150–400)
Platelets: 260 10*3/uL (ref 150–400)
Platelets: 274 10*3/uL (ref 150–400)
Platelets: 281 10*3/uL (ref 150–400)
Platelets: 287 10*3/uL (ref 150–400)
Platelets: 287 10*3/uL (ref 150–400)
RBC: 3.58 MIL/uL — ABNORMAL LOW (ref 3.87–5.11)
RBC: 3.6 MIL/uL — ABNORMAL LOW (ref 3.87–5.11)
RBC: 3.75 MIL/uL — ABNORMAL LOW (ref 3.87–5.11)
RBC: 3.86 MIL/uL — ABNORMAL LOW (ref 3.87–5.11)
RBC: 4.1 MIL/uL (ref 3.87–5.11)
RBC: 4.11 MIL/uL (ref 3.87–5.11)
RBC: 4.34 MIL/uL (ref 3.87–5.11)
RDW: 14 % (ref 11.5–15.5)
RDW: 14.1 % (ref 11.5–15.5)
RDW: 14.2 % (ref 11.5–15.5)
RDW: 14.3 % (ref 11.5–15.5)
RDW: 14.4 % (ref 11.5–15.5)
RDW: 14.5 % (ref 11.5–15.5)
RDW: 14.5 % (ref 11.5–15.5)
WBC: 12.1 10*3/uL — ABNORMAL HIGH (ref 4.0–10.5)
WBC: 4.6 10*3/uL (ref 4.0–10.5)
WBC: 5.4 10*3/uL (ref 4.0–10.5)
WBC: 6.6 10*3/uL (ref 4.0–10.5)
WBC: 6.8 10*3/uL (ref 4.0–10.5)
WBC: 7.7 10*3/uL (ref 4.0–10.5)
WBC: 8.6 10*3/uL (ref 4.0–10.5)

## 2010-06-10 LAB — GLUCOSE, CAPILLARY
Glucose-Capillary: 101 mg/dL — ABNORMAL HIGH (ref 70–99)
Glucose-Capillary: 101 mg/dL — ABNORMAL HIGH (ref 70–99)
Glucose-Capillary: 102 mg/dL — ABNORMAL HIGH (ref 70–99)
Glucose-Capillary: 104 mg/dL — ABNORMAL HIGH (ref 70–99)
Glucose-Capillary: 107 mg/dL — ABNORMAL HIGH (ref 70–99)
Glucose-Capillary: 109 mg/dL — ABNORMAL HIGH (ref 70–99)
Glucose-Capillary: 111 mg/dL — ABNORMAL HIGH (ref 70–99)
Glucose-Capillary: 113 mg/dL — ABNORMAL HIGH (ref 70–99)
Glucose-Capillary: 114 mg/dL — ABNORMAL HIGH (ref 70–99)
Glucose-Capillary: 114 mg/dL — ABNORMAL HIGH (ref 70–99)
Glucose-Capillary: 118 mg/dL — ABNORMAL HIGH (ref 70–99)
Glucose-Capillary: 119 mg/dL — ABNORMAL HIGH (ref 70–99)
Glucose-Capillary: 120 mg/dL — ABNORMAL HIGH (ref 70–99)
Glucose-Capillary: 120 mg/dL — ABNORMAL HIGH (ref 70–99)
Glucose-Capillary: 122 mg/dL — ABNORMAL HIGH (ref 70–99)
Glucose-Capillary: 122 mg/dL — ABNORMAL HIGH (ref 70–99)
Glucose-Capillary: 123 mg/dL — ABNORMAL HIGH (ref 70–99)
Glucose-Capillary: 123 mg/dL — ABNORMAL HIGH (ref 70–99)
Glucose-Capillary: 123 mg/dL — ABNORMAL HIGH (ref 70–99)
Glucose-Capillary: 123 mg/dL — ABNORMAL HIGH (ref 70–99)
Glucose-Capillary: 124 mg/dL — ABNORMAL HIGH (ref 70–99)
Glucose-Capillary: 125 mg/dL — ABNORMAL HIGH (ref 70–99)
Glucose-Capillary: 125 mg/dL — ABNORMAL HIGH (ref 70–99)
Glucose-Capillary: 125 mg/dL — ABNORMAL HIGH (ref 70–99)
Glucose-Capillary: 126 mg/dL — ABNORMAL HIGH (ref 70–99)
Glucose-Capillary: 127 mg/dL — ABNORMAL HIGH (ref 70–99)
Glucose-Capillary: 130 mg/dL — ABNORMAL HIGH (ref 70–99)
Glucose-Capillary: 131 mg/dL — ABNORMAL HIGH (ref 70–99)
Glucose-Capillary: 132 mg/dL — ABNORMAL HIGH (ref 70–99)
Glucose-Capillary: 133 mg/dL — ABNORMAL HIGH (ref 70–99)
Glucose-Capillary: 133 mg/dL — ABNORMAL HIGH (ref 70–99)
Glucose-Capillary: 133 mg/dL — ABNORMAL HIGH (ref 70–99)
Glucose-Capillary: 133 mg/dL — ABNORMAL HIGH (ref 70–99)
Glucose-Capillary: 138 mg/dL — ABNORMAL HIGH (ref 70–99)
Glucose-Capillary: 138 mg/dL — ABNORMAL HIGH (ref 70–99)
Glucose-Capillary: 139 mg/dL — ABNORMAL HIGH (ref 70–99)
Glucose-Capillary: 141 mg/dL — ABNORMAL HIGH (ref 70–99)
Glucose-Capillary: 142 mg/dL — ABNORMAL HIGH (ref 70–99)
Glucose-Capillary: 143 mg/dL — ABNORMAL HIGH (ref 70–99)
Glucose-Capillary: 143 mg/dL — ABNORMAL HIGH (ref 70–99)
Glucose-Capillary: 147 mg/dL — ABNORMAL HIGH (ref 70–99)
Glucose-Capillary: 148 mg/dL — ABNORMAL HIGH (ref 70–99)
Glucose-Capillary: 149 mg/dL — ABNORMAL HIGH (ref 70–99)
Glucose-Capillary: 152 mg/dL — ABNORMAL HIGH (ref 70–99)
Glucose-Capillary: 152 mg/dL — ABNORMAL HIGH (ref 70–99)
Glucose-Capillary: 158 mg/dL — ABNORMAL HIGH (ref 70–99)
Glucose-Capillary: 159 mg/dL — ABNORMAL HIGH (ref 70–99)
Glucose-Capillary: 159 mg/dL — ABNORMAL HIGH (ref 70–99)
Glucose-Capillary: 160 mg/dL — ABNORMAL HIGH (ref 70–99)
Glucose-Capillary: 161 mg/dL — ABNORMAL HIGH (ref 70–99)
Glucose-Capillary: 161 mg/dL — ABNORMAL HIGH (ref 70–99)
Glucose-Capillary: 165 mg/dL — ABNORMAL HIGH (ref 70–99)
Glucose-Capillary: 178 mg/dL — ABNORMAL HIGH (ref 70–99)
Glucose-Capillary: 94 mg/dL (ref 70–99)
Glucose-Capillary: 95 mg/dL (ref 70–99)
Glucose-Capillary: 99 mg/dL (ref 70–99)

## 2010-06-10 LAB — COMPREHENSIVE METABOLIC PANEL
ALT: 17 U/L (ref 0–35)
ALT: 31 U/L (ref 0–35)
ALT: 38 U/L — ABNORMAL HIGH (ref 0–35)
AST: 16 U/L (ref 0–37)
AST: 20 U/L (ref 0–37)
AST: 22 U/L (ref 0–37)
Albumin: 2.5 g/dL — ABNORMAL LOW (ref 3.5–5.2)
Albumin: 2.6 g/dL — ABNORMAL LOW (ref 3.5–5.2)
Albumin: 3.8 g/dL (ref 3.5–5.2)
Alkaline Phosphatase: 60 U/L (ref 39–117)
Alkaline Phosphatase: 81 U/L (ref 39–117)
Alkaline Phosphatase: 91 U/L (ref 39–117)
BUN: 11 mg/dL (ref 6–23)
BUN: 19 mg/dL (ref 6–23)
BUN: 20 mg/dL (ref 6–23)
CO2: 23 mEq/L (ref 19–32)
CO2: 28 mEq/L (ref 19–32)
CO2: 28 mEq/L (ref 19–32)
Calcium: 8.1 mg/dL — ABNORMAL LOW (ref 8.4–10.5)
Calcium: 8.4 mg/dL (ref 8.4–10.5)
Calcium: 9.3 mg/dL (ref 8.4–10.5)
Chloride: 104 mEq/L (ref 96–112)
Chloride: 108 mEq/L (ref 96–112)
Chloride: 97 mEq/L (ref 96–112)
Creatinine, Ser: 0.78 mg/dL (ref 0.4–1.2)
Creatinine, Ser: 0.78 mg/dL (ref 0.4–1.2)
Creatinine, Ser: 0.97 mg/dL (ref 0.4–1.2)
GFR calc Af Amer: >60 mL/min (ref >60)
GFR calc Af Amer: >60 mL/min (ref >60)
GFR calc Af Amer: >60 mL/min (ref >60)
GFR calc non Af Amer: 57 mL/min — ABNORMAL LOW (ref >60)
GFR calc non Af Amer: >60 mL/min (ref >60)
GFR calc non Af Amer: >60 mL/min (ref >60)
Glucose, Bld: 132 mg/dL — ABNORMAL HIGH (ref 70–99)
Glucose, Bld: 134 mg/dL — ABNORMAL HIGH (ref 70–99)
Glucose, Bld: 99 mg/dL (ref 70–99)
Potassium: 3.6 mEq/L (ref 3.5–5.1)
Potassium: 4.1 mEq/L (ref 3.5–5.1)
Potassium: 4.6 mEq/L (ref 3.5–5.1)
Sodium: 136 mEq/L (ref 135–145)
Sodium: 137 mEq/L (ref 135–145)
Sodium: 139 mEq/L (ref 135–145)
Total Bilirubin: 0.1 mg/dL — ABNORMAL LOW (ref 0.3–1.2)
Total Bilirubin: 0.4 mg/dL (ref 0.3–1.2)
Total Bilirubin: 0.5 mg/dL (ref 0.3–1.2)
Total Protein: 5.4 g/dL — ABNORMAL LOW (ref 6.0–8.3)
Total Protein: 5.8 g/dL — ABNORMAL LOW (ref 6.0–8.3)
Total Protein: 6.8 g/dL (ref 6.0–8.3)

## 2010-06-10 LAB — DIFFERENTIAL
Basophils Absolute: 0 10*3/uL (ref 0.0–0.1)
Basophils Absolute: 0 10*3/uL (ref 0.0–0.1)
Basophils Absolute: 0.1 10*3/uL (ref 0.0–0.1)
Basophils Relative: 0 % (ref 0–1)
Basophils Relative: 1 % (ref 0–1)
Basophils Relative: 1 % (ref 0–1)
Eosinophils Absolute: 0.2 10*3/uL (ref 0.0–0.7)
Eosinophils Absolute: 0.3 10*3/uL (ref 0.0–0.7)
Eosinophils Absolute: 0.3 10*3/uL (ref 0.0–0.7)
Eosinophils Relative: 2 % (ref 0–5)
Eosinophils Relative: 4 % (ref 0–5)
Eosinophils Relative: 5 % (ref 0–5)
Lymphocytes Relative: 29 % (ref 12–46)
Lymphocytes Relative: 30 % (ref 12–46)
Lymphocytes Relative: 35 % (ref 12–46)
Lymphs Abs: 1.9 10*3/uL (ref 0.7–4.0)
Lymphs Abs: 2.4 10*3/uL (ref 0.7–4.0)
Lymphs Abs: 2.6 10*3/uL (ref 0.7–4.0)
Monocytes Absolute: 0.8 10*3/uL (ref 0.1–1.0)
Monocytes Absolute: 0.9 10*3/uL (ref 0.1–1.0)
Monocytes Absolute: 1.3 10*3/uL — ABNORMAL HIGH (ref 0.1–1.0)
Monocytes Relative: 10 % (ref 3–12)
Monocytes Relative: 13 % — ABNORMAL HIGH (ref 3–12)
Monocytes Relative: 20 % — ABNORMAL HIGH (ref 3–12)
Neutro Abs: 2.8 10*3/uL (ref 1.7–7.7)
Neutro Abs: 3.5 10*3/uL (ref 1.7–7.7)
Neutro Abs: 5 10*3/uL (ref 1.7–7.7)
Neutrophils Relative %: 41 % — ABNORMAL LOW (ref 43–77)
Neutrophils Relative %: 53 % (ref 43–77)
Neutrophils Relative %: 58 % (ref 43–77)

## 2010-06-10 LAB — BASIC METABOLIC PANEL
BUN: 13 mg/dL (ref 6–23)
BUN: 14 mg/dL (ref 6–23)
BUN: 15 mg/dL (ref 6–23)
BUN: 16 mg/dL (ref 6–23)
CO2: 25 mEq/L (ref 19–32)
CO2: 27 mEq/L (ref 19–32)
CO2: 29 mEq/L (ref 19–32)
CO2: 33 mEq/L — ABNORMAL HIGH (ref 19–32)
Calcium: 8.3 mg/dL — ABNORMAL LOW (ref 8.4–10.5)
Calcium: 8.6 mg/dL (ref 8.4–10.5)
Calcium: 8.6 mg/dL (ref 8.4–10.5)
Calcium: 9.1 mg/dL (ref 8.4–10.5)
Chloride: 100 mEq/L (ref 96–112)
Chloride: 101 mEq/L (ref 96–112)
Chloride: 97 mEq/L (ref 96–112)
Chloride: 99 mEq/L (ref 96–112)
Creatinine, Ser: 0.79 mg/dL (ref 0.4–1.2)
Creatinine, Ser: 0.86 mg/dL (ref 0.4–1.2)
Creatinine, Ser: 0.86 mg/dL (ref 0.4–1.2)
Creatinine, Ser: 0.97 mg/dL (ref 0.4–1.2)
GFR calc Af Amer: >60 mL/min (ref >60)
GFR calc Af Amer: >60 mL/min (ref >60)
GFR calc Af Amer: >60 mL/min (ref >60)
GFR calc Af Amer: >60 mL/min (ref >60)
GFR calc non Af Amer: 57 mL/min — ABNORMAL LOW (ref >60)
GFR calc non Af Amer: >60 mL/min (ref >60)
GFR calc non Af Amer: >60 mL/min (ref >60)
GFR calc non Af Amer: >60 mL/min (ref >60)
Glucose, Bld: 116 mg/dL — ABNORMAL HIGH (ref 70–99)
Glucose, Bld: 116 mg/dL — ABNORMAL HIGH (ref 70–99)
Glucose, Bld: 120 mg/dL — ABNORMAL HIGH (ref 70–99)
Glucose, Bld: 165 mg/dL — ABNORMAL HIGH (ref 70–99)
Potassium: 3.7 mEq/L (ref 3.5–5.1)
Potassium: 3.9 mEq/L (ref 3.5–5.1)
Potassium: 4.7 mEq/L (ref 3.5–5.1)
Potassium: 5 mEq/L (ref 3.5–5.1)
Sodium: 132 mEq/L — ABNORMAL LOW (ref 135–145)
Sodium: 132 mEq/L — ABNORMAL LOW (ref 135–145)
Sodium: 133 mEq/L — ABNORMAL LOW (ref 135–145)
Sodium: 140 mEq/L (ref 135–145)

## 2010-06-10 LAB — URINALYSIS, ROUTINE W REFLEX MICROSCOPIC
Bilirubin Urine: NEGATIVE
Glucose, UA: NEGATIVE mg/dL
Hgb urine dipstick: NEGATIVE
Ketones, ur: NEGATIVE mg/dL
Nitrite: NEGATIVE
Protein, ur: NEGATIVE mg/dL
Specific Gravity, Urine: 1.019 (ref 1.005–1.030)
Urobilinogen, UA: 0.2 mg/dL (ref 0.0–1.0)
pH: 5.5 (ref 5.0–8.0)

## 2010-06-10 LAB — HEPATIC FUNCTION PANEL
ALT: 54 U/L — ABNORMAL HIGH (ref 0–35)
AST: 39 U/L — ABNORMAL HIGH (ref 0–37)
Albumin: 2.8 g/dL — ABNORMAL LOW (ref 3.5–5.2)
Alkaline Phosphatase: 106 U/L (ref 39–117)
Bilirubin, Direct: 0.1 mg/dL (ref 0.0–0.3)
Indirect Bilirubin: 0.3 mg/dL (ref 0.3–0.9)
Total Bilirubin: 0.4 mg/dL (ref 0.3–1.2)
Total Protein: 6.4 g/dL (ref 6.0–8.3)

## 2010-06-10 LAB — PREALBUMIN
Prealbumin: 11.1 mg/dL — ABNORMAL LOW (ref 18.0–45.0)
Prealbumin: 9.3 mg/dL — ABNORMAL LOW (ref 18.0–45.0)

## 2010-06-10 LAB — MAGNESIUM
Magnesium: 1.9 mg/dL (ref 1.5–2.5)
Magnesium: 2 mg/dL (ref 1.5–2.5)

## 2010-06-10 LAB — LIPASE, BLOOD: Lipase: 18 U/L (ref 11–59)

## 2010-06-10 LAB — PHOSPHORUS
Phosphorus: 4.2 mg/dL (ref 2.3–4.6)
Phosphorus: 4.6 mg/dL (ref 2.3–4.6)

## 2010-06-10 LAB — TRIGLYCERIDES
Triglycerides: 114 mg/dL (ref <150)
Triglycerides: 117 mg/dL (ref <150)

## 2010-06-10 LAB — CHOLESTEROL, TOTAL
Cholesterol: 110 mg/dL (ref 0–200)
Cholesterol: 99 mg/dL (ref 0–200)

## 2010-06-12 ENCOUNTER — Other Ambulatory Visit: Payer: Self-pay | Admitting: Family Medicine

## 2010-06-15 ENCOUNTER — Other Ambulatory Visit: Payer: Self-pay | Admitting: *Deleted

## 2010-06-15 MED ORDER — ESOMEPRAZOLE MAGNESIUM 40 MG PO CPDR: 40.0000 mg | DELAYED_RELEASE_CAPSULE | Freq: Every day | ORAL | Status: DC

## 2010-06-22 ENCOUNTER — Encounter: Payer: Self-pay | Admitting: Family Medicine

## 2010-07-21 NOTE — Op Note (Signed)
NAMEESTEPHANIA, LICCIARDI NO.:  [id number]   MEDICAL RECORD NO.:  [id number]          PATIENT TYPE:  AMB   LOCATION:  [facility]                         FACILITY:  [facility]   PHYSICIAN:  Marlowe Kays, M.D.  DATE OF BIRTH:  1935-06-28   DATE OF PROCEDURE:  01/23/2007  DATE OF DISCHARGE:                               OPERATIVE REPORT   PREOPERATIVE DIAGNOSES:  1. Torn medial and lateral menisci.  2. Osteoarthritis right knee.   POSTOPERATIVE DIAGNOSES:  1. Torn medial and lateral menisci.  2. Osteoarthritis right knee.   OPERATION:  Right knee arthroscopy with 1) partial medial and lateral  meniscectomy 2) shaving of the medial and lateral femoral condyles, 3)  debridement of patella, trochlear notch and excision of medial shelf  plica.   SURGEON:  Dr. Marlowe Kays.   ASSISTANT:  Nurse.   ANESTHESIA:  General.   PATHOLOGY AND JUSTIFICATION FOR PROCEDURE:  She sustained an injury to  her right knee several months ago and because of persistent pain and  swelling in the knee, we had an MRI performed on October 29 showing the  above-mentioned findings.   DESCRIPTION OF PROCEDURE:  Satisfactory general anesthesia, Ace wrap and  knee support to left lower extremity, pneumatic tourniquet and thigh  stabilizer to right lower extremity, right leg was esmarched out  nonsterilely prepped with DuraPrep and draped in a sterile field. A time-  out performed, superior and medial saline inflow. First through an  anterolateral portal, the medial compartment of the knee joint was  evaluated.  She had grade 2/4 chondromalacia of the medial femoral  condyle which I debrided down with a 3.5 shaver.  She had some  roughening of the anterior third of the medial meniscus which I also  shaved. Posteriorly she had some moderate tearing of the entire  posterior third which I trimmed back to a stable rim with a combination  of baskets and shaving with a 3.5 shaver. Then looking up in the  medial  gutter and suprapatellar area, she had good bit of wear in the  patellofemoral joint which I began to debride down and also medial shelf  plica of moderate-size which I was unable to get through this portal. I  then reversed portals and I used arthroscopic scissors to cut the plica  and resect it.  A final picture was taken of the patellofemoral joint.  The lateral joint demonstrated grade 2/4 chondromalacia of the lateral  femoral condyle which I debrided down with a 3.5 shaver.  She also had  diffuse tearing of the entire inner third of the lateral meniscus which  I trimmed back to a stable rim with baskets and the shaver.  The knee  joint was then irrigated until clear and all fluid possible removed.  The two anterior portals were closed with 4-0 nylon. I then injected 20  mL of 0.5% Marcaine with adrenalin and 4 mg morphine into the inflow  apparatus which was removed  and this portal closed with 4-0 nylon as well.  Betadine adaptic dry  sterile dressing were applied, tourniquet was released.  She tolerated  the procedure well.  At the time of this dictation she was on her way to  the recovery room in satisfactory condition with no known complications.           ______________________________  Marlowe Kays, M.D.     JA/MEDQ  D:  01/23/2007  T:  01/23/2007  Job:  [id number]

## 2010-07-21 NOTE — Assessment & Plan Note (Signed)
[facility] OFFICE NOTE   TYJAH, HAI                      MRN:          [id number]  DATE:05/26/2007                            DOB:          12/03/1935    PRIMARY CARE PHYSICIAN:  Arta Silence, MD   REASON FOR PRESENTATION:  A patient with chest discomfort and shortness  of breath.   HISTORY OF PRESENT ILLNESS:  The patient is a pleasant 75 year old white  female who does report a stress test some years ago though I do not have  any report of this.  She describes an adenosine Cardiolite.  This was  apparently without any evidence of high-grade obstructive coronary  disease as she needed no further followup.  She does not remember  exactly why she had this done.  She has been followed by Dr. Hetty Ely  and has had chronic shortness of breath.  This has been for 7-8 years  but has gotten worse over the last 3-4 years.  She has also gained a lot  of weight.  This happened while she was caring for an ailing mother.  She is short of breath walking from one room to another in her house.  She does not have home O2.  She does not describe PND or orthopnea.  She  has not had any acute change.  However, she has also noticed chest  discomfort.  This is a burning discomfort.  She notices it when she  tried to exercise at [facility] or even when she climbs a flight of  stairs.  She thinks this has been slowly progressive over a few years  too.  She has not had any resting chest pressure.  She does not describe  neck or arm discomfort.  It is a moderate burning discomfort and it goes  away when she stops what she is doing.  There is no associated nausea,  vomiting or diaphoresis.  She thinks this is new since the last stress  test.  Of note, she has not had any recent pulmonary function testing  though she had these in the past.  She does not follow with a  pulmonologist.   PAST MEDICAL HISTORY:  Bronchitis,  hypertension x15 years, borderline  diabetes, hyperlipidemia and the patient does not want to take Lipitor.  Hypothyroidism.   PAST SURGICAL HISTORY:  Rotator cuff surgery, cholecystectomy,  hysterectomy, thumb surgery, knee arthroscopy.   ALLERGIES:  SULFA, CODEINE.   MEDICATIONS:  1. Prozac 10 mg daily.  2. Wellbutrin 150 mg daily.  3. Norvasc 5 mg daily.  4. Spiriva.  5. Nexium 40 mg daily.  6. Synthroid 137 mcg daily.  7. Advair.  8. Albuterol.  9. Alprazolam.  10.Allegra.  11.Flonase.   SOCIAL HISTORY:  The patient is retired.  She has 2 children and 3  grandchildren.  She quit smoking in 2007.  She smoked for years.   FAMILY HISTORY:  Is noncontributory for early coronary artery disease.  Her mother died in her 36s with lung problems from smoking.  Her father  died at age 5  of old age.   REVIEW OF SYSTEMS:  Is positive as stated in the HPI and positive for  distant history of frequent syncope that she learned to control in her  40s, glasses, occasional vertigo, orthostatic symptoms, colitis related  to stress, reflux, leg cramping at night.  Negative for other systems.   PHYSICAL EXAMINATION:  GENERAL:  The patient is in no distress.  VITAL SIGNS:  Blood pressure 146/82, heart rate 76 and regular, weight  218 pounds.  HEENT:  Eyelids unremarkable, pupils equal, round and reactive to light.  Fundi within normal limits.  Oral mucosa unremarkable.  NECK:  No jugular venous distention at 45 degrees.  Carotid upstroke  brisk and symmetric, soft right carotid bruit, no thyromegaly.  LYMPHATICS:  No cervical, axillary or inguinal adenopathy.  LUNGS:  Clear to auscultation bilaterally.  BACK:  No costovertebral angle tenderness.  CHEST:  Unremarkable.  HEART:  PMI not displaced or sustained, S1 and S2 within normal limits.  No S3, no S4, clicks, rubs, murmurs.  ABDOMEN:  Obese, positive bowel sounds, normal in frequency and pitch,  no bruits, rebound, guarding or  midline pulsatile mass.  No hepatomegaly  or splenomegaly.  SKIN:  No rashes, no nodules.  EXTREMITIES:  2+ pulses throughout, no edema, cyanosis or clubbing.  NEUROLOGICAL:  Oriented to person, place and time.  Cranial nerves II-  XII grossly intact.  Motor grossly intact.   EKG sinus rhythm, rate 76, axis within normal limits, intervals within  normal limits, poor anterior R-wave progression, nonspecific T-wave  inversion in the lateral leads, no old EKGs for comparison.   ASSESSMENT AND PLAN:  Chest discomfort.  The patient's chest discomfort  and shortness of breath are concerning for a possible angina in a  patient with significant cardiovascular risk factors.  She needs  screening with a stress test.  She would not be able to walk on a  treadmill with her dyspnea and so will have an adenosine Cardiolite.  She has no wheezing and should tolerate this.  Further evaluation based  on these results.  She needs risk reduction.  1. Carotid bruit.  I will check a carotid Doppler on the day of her      stress test.  2. Shortness of breath.  This will be evaluated as above.  If this      test is unrevealing for an obvious cardiac cause I might suggest      pulmonary evaluation.  3. Obesity.  We discussed the need for weight loss with diet and      exercise.  I prescribed the [employer] or other low glycemic      diets.  4. Hypertension.  Her blood pressure is slightly elevated.  She said      she would keep a blood pressure diary and have up titration of her      meds as needed.  5. Dyslipidemia.  I did discuss with her the benefits of statins.      Hopefully, she will feel more favorable toward them but will      discuss this with Dr. Hetty Ely.  6. Borderline diabetes.  Per Dr. Hetty Ely.  Certainly weight loss and      therapeutic lifestyle changes will help with this.   FOLLOWUP:  I will see the patient back based on the results of the above  or future symptoms.     Rollene Rotunda, MD, [facility]  Electronically Signed    JH/MedQ  DD: 05/26/2007  DT: 05/26/2007  Job #: [id number]   cc:   Arta Silence, MD

## 2010-07-24 NOTE — Op Note (Signed)
[facility]  Patient:    Alexandria [name], Alexandria [name]                      MRN: [id number] Proc. Date: 07/27/00 Adm. Date:  [id number] Attending:  Marlowe Kays Page                           Operative Report  PREOPERATIVE DIAGNOSES: 1. Torn medial meniscus and medial compartment arthritis, left knee. 2. Mortons neuroma, second and third web space, left foot.  POSTOPERATIVE DIAGNOSES: 1. Torn medial and lateral menisci, left knee. 2. 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 3. Mortons neuroma, second and third web space, left foot.  OPERATION: 1. Left knee arthroscopy with:    a. Partial medial and lateral meniscectomy.    b. Shaving of medial and lateral femoral condyles. 2. Excision of Mortons neuroma, second and third web space, left foot.  SURGEON:  Illene Labrador. [place], M.D.  ASSISTANT:  Nurse.  ANESTHESIA:  General.  JUSTIFICATION FOR PROCEDURE:  She has had longstanding problems with her left foot, which I have followed for four to five years.  In the left foot, she has had symptoms related to both the second and third, and third and fourth web spaces, but recently the second and third web space only, and has had at least one steroid injection without prolonged relief.  She would like to have the Mortons neuroma excised in conjunction with her left nee arthroscopy with an MRI demonstrating a posterior tear of the medial meniscus as well as arthritic changes in the medial compartment of the knee joint.  See pathology below for additional details.  DESCRIPTION OF PROCEDURE:  After satisfactory general anesthesia, pneumatic tourniquet, and thigh stabilizer, the left knee was prepped with DuraPrep and draped in the sterile field.  We performed the arthroscopic surgery first in the superomedial portal first through an inflow portal.  First, through an anterolateral portal, the medial  compartment of the knee joint was evaluated. On penetration of the joint, a large amount of articular cartilaginous debris came forth, and first visualization on the joint, the medial compartment of the knee joint was just filled with this articular cartilage with articular cartilage fragments.  I used a 3.5 shaver to perform a modest synovectomy medially and cleaned out most of the fragments.  She had a good-sized defect over principally the most medial portion on the medial femoral condyle and also slightly over the remainder of the medial femoral condyle on the weightbearing surface.  I shaved this down until smooth with the 3.5 shaver.  There was still some remnant of cushion.  Posteriorly, the medial meniscus demonstrated fairly significant tear of the posterior curve, as well as a small intercondylar tear, and this was associated with probably some secondary chondromalacia of the medial tibial plateau.  I trimmed the medial meniscus back to a stable rim with baskets and then shaved down until smooth with a 3.5 shaver with the remaining rim being stable on probing.  I then looked up at the medial gutter and the suprapatellar area.  There was some wear to the patella, but nothing that was shavable.  I then reversed portals.  The ACL was intact.  She had some modest synovitis laterally which I resected.  She had a grade 2 out of 4 lateral defect in her lateral femoral condyle  with some partially detached articular cartilage which I shaved down until smooth with a 3.5 shaver.  The lateral meniscus had significant fraying, bordering on frank substantial tear, and after picturing this, I shaved this down until smooth with the 3.5 shaver as well.  Looking at the lateral gutter and suprapatellar area no additional pathology was noted.  The knee joint was then irrigated until clear and all ________ removed.  The two anterior portals were closed with 4-0 nylon.  Twenty cubic centimeters  of 0.5% Marcaine with adrenalin and 4 mg of morphine were then instilled through the inflow apparatus.  Betadine Adaptic and dry sterile dressings were applied to the knee.  The scrub nurse then remained sterile while I removed the stockinette and covered the lower leg, and we then brought the foot of the table up, and prepped the lower foot and ankle with DuraPrep and draped in the sterile field.  I used a dorsal web splitting incision between the second and third toes, and with small scissor dissection, dissected our fairly large and typical-appearing scarred common digital nerve.  I grasped this with an Allis clamp and dissected out the branches to the adjacent second and third toes, which I cut this with the cutting cautery.  I then freed up the mass with tangled scar and nerve with a combination of cautery and scissor dissection, working down in between the intermetatarsal head area, and I then cut the stalk as far proximally as possible.  I checked, and there did not appear to be any remnant of nerve tissue visible.  I then placed Gelfoam in between the metatarsal heads and in the base of the wound.  The wound was irrigated with sterile saline and closed with interrupted 3-0 Vicryl in the subcutaneous tissue and interrupted 4-0 nylon mattress sutures in the skin.  Betadine Adaptic dry sterile dressings were applied.  The tourniquet was released and an Ace wrap was applied to the left knee.  She tolerated the procedure well and was taken to the recovery room in satisfactory condition with no known complications. DD:  07/27/00 TD:  07/28/00 Job: [id number]

## 2010-07-24 NOTE — Assessment & Plan Note (Signed)
[facility]                                   ON-CALL NOTE   Alexandria, [name]                        MRN:          [id number]  DATE:01/08/2006                            DOB:          Dec 23, 1935    PRIMARY CARE PHYSICIAN:  Arta Silence, M.D.   [name] calls today stating that she has been having cough and congestion  for over two weeks associated with low grade fever.  She has noticed  increased weakness.  I believe the patient stated she had a pneumonia in the  past.  She denies any shortness of breath or dyspnea on exertion.   PLAN:  Given the __________ low fever for the last two weeks with cough and  congestion, advised the patient that she needs to be seen this morning.  Recommended that she either go to [facility] or [facility], where there is x-ray capabilities.  The patient expressed  understanding.    ______________________________  Leanne Chang, M.D.    LA/MedQ  DD: 01/08/2006  DT: 01/08/2006  Job #: [id number]   cc:   Arta Silence, MD

## 2010-07-24 NOTE — Op Note (Signed)
NAME:  Alexandria [name], Alexandria [name]                         ACCOUNT NO.:  [id number]   MEDICAL RECORD NO.:  [id number]                   PATIENT TYPE:  AMB   LOCATION:  [facility]                                 FACILITY:  [facility]   PHYSICIAN:  Marlowe Kays, M.D.               DATE OF BIRTH:  December 19, 1935   DATE OF PROCEDURE:  09/26/2003  DATE OF DISCHARGE:                                 OPERATIVE REPORT   PREOPERATIVE DIAGNOSES:  1. Bucket handle tear of the lateral meniscus.  2. Suspected tear of the medial meniscus.  3. Proximal anterior cruciate ligament tear.  4. Grade 3 medial collateral  ligament injury.   POSTOPERATIVE DIAGNOSES:  1. Bucket handle tear of the lateral meniscus.  2. Osteoarthritis.  3. Partial anterior cruciate ligament tear, right knee.   OPERATION:  Right knee arthroscopy with excision of bucket handle tear of  the lateral meniscus and shaving of the medial femoral condyle.   SURGEON:  Marlowe Kays, M.D.   ASSISTANT:  Nurse.   ANESTHESIA:  General.   PATHOLOGY AND JUSTIFICATION FOR PROCEDURE:  She sustained a flexion injury  on September 06, 2003. Subsequently she came to see me and I obtained an MRI on  September 14, 2003 which showed the above mentioned preoperative diagnosis. See  operative description below for additional details.   DESCRIPTION OF PROCEDURE:  Satisfactory general anesthesia, knee was  esmarched out nonsterilely, thigh stabilizer, right knee was prepped with  Duraprep, draped in a sterile field. Examination under anesthesia revealed  stable medial collateral ligament and negative anterior drawer sign and  Lachman test. Superior medial saline inflow, first through an anterior  medial portal the lateral compartment of the knee joint was evaluated. She  had a bucket handle tear which was pictured, there was some ACL disruption  but I had a hard time getting good visualization of the most proximal  portion of the ACL but the fibers did appear to be intact.  I  handled the  bucket handle tear by excising the most lateral portion with scissors and  then removing the bucket handle fragment in piecemeal portion since it was  attached into the intercondylar area which made it a little more risky and  visualization was difficult to cut it with scissors.  She also had abrasion  injury to the anterior third of the lateral meniscus which I also had  removed as well with baskets, scissors and the combination of shavers. The  final remnant of meniscus was stable. She had worn some of the lateral  tibial plateau because of the bucket handle portion presumably. I then  reversed portals, she had a little synovitis medial, tried to resect it.  Her medial meniscus did appear basically to be intact. She had some partial  detachment of articular cartilage posteriorly on the medial femoral condyle  which I shaved down until smooth.  Looking up in the medial gutter and  suprapatellar area, no abnormalities were noted.  The knee joint was then  irrigated until clear and all fluid possible removed. The two anterior  portals were closed with 4-0 nylon.  20 mL of 0.5% Marcaine with adrenaline  was then instilled through the inflow apparatus which was removed and  this portal closed with 4-0 nylon as well. Betadine Adaptic dry sterile  dressing were applied, tourniquet was released. She tolerated the procedure  well and was taken to the recovery room in satisfactory condition with no  known complications.                                               Marlowe Kays, M.D.    JA/MEDQ  D:  09/26/2003  T:  09/26/2003  Job:  [id number]

## 2010-08-14 ENCOUNTER — Other Ambulatory Visit: Payer: Self-pay | Admitting: Internal Medicine

## 2010-08-25 ENCOUNTER — Encounter: Payer: Self-pay | Admitting: Cardiovascular Disease

## 2010-09-18 ENCOUNTER — Other Ambulatory Visit: Payer: Self-pay | Admitting: Family Medicine

## 2010-10-01 ENCOUNTER — Telehealth: Payer: Self-pay | Admitting: Internal Medicine

## 2010-10-01 NOTE — Telephone Encounter (Signed)
Called and spoke with pt. Pt hasn't seen MW since 11/11.  Pt states she has had increasing sob and tightness in chest.  Pt states the increased sob has made her "scared and anxious."  Pt states she is taking her symbicort, spiriva and proair without relief.  Offered pt an appt tomorrow.  Pt agreed to come in and be seen.  Pt scheduled to see MW at 9 am and will bring all her medicines with her to the appt.

## 2010-10-02 ENCOUNTER — Ambulatory Visit (INDEPENDENT_AMBULATORY_CARE_PROVIDER_SITE_OTHER)
Admission: RE | Admit: 2010-10-02 | Discharge: 2010-10-02 | Disposition: A | Discharge status: Home / Self Care | Payer: Medicare Other | Source: Ambulatory Visit | Attending: Internal Medicine | Admitting: Internal Medicine

## 2010-10-02 ENCOUNTER — Ambulatory Visit (INDEPENDENT_AMBULATORY_CARE_PROVIDER_SITE_OTHER): Payer: Medicare Other | Admitting: Internal Medicine

## 2010-10-02 ENCOUNTER — Other Ambulatory Visit (INDEPENDENT_AMBULATORY_CARE_PROVIDER_SITE_OTHER): Payer: Medicare Other

## 2010-10-02 ENCOUNTER — Encounter: Payer: Self-pay | Admitting: Internal Medicine

## 2010-10-02 ENCOUNTER — Telehealth: Payer: Self-pay | Admitting: Internal Medicine

## 2010-10-02 VITALS — BP 132/78 | HR 85 | Temp 97.6°F | Ht 64.75 in | Wt 232.8 lb

## 2010-10-02 DIAGNOSIS — R0602 Shortness of breath: Secondary | ICD-10-CM

## 2010-10-02 DIAGNOSIS — E785 Hyperlipidemia, unspecified: Secondary | ICD-10-CM

## 2010-10-02 LAB — CBC WITH DIFFERENTIAL/PLATELET
Basophils Absolute: 0 10*3/uL (ref 0.0–0.1)
Basophils Relative: 0.3 % (ref 0.0–3.0)
Eosinophils Absolute: 0.1 10*3/uL (ref 0.0–0.7)
Eosinophils Relative: 1.6 % (ref 0.0–5.0)
HCT: 37 % (ref 36.0–46.0)
Hemoglobin: 12.4 g/dL (ref 12.0–15.0)
Lymphocytes Relative: 22.7 % (ref 12.0–46.0)
Lymphs Abs: 1.7 10*3/uL (ref 0.7–4.0)
MCHC: 33.4 g/dL (ref 30.0–36.0)
MCV: 85.4 fl (ref 78.0–100.0)
Monocytes Absolute: 0.6 10*3/uL (ref 0.1–1.0)
Monocytes Relative: 8.6 % (ref 3.0–12.0)
Neutro Abs: 4.9 10*3/uL (ref 1.4–7.7)
Neutrophils Relative %: 66.8 % (ref 43.0–77.0)
Platelets: 284 10*3/uL (ref 150.0–400.0)
RBC: 4.34 Mil/uL (ref 3.87–5.11)
RDW: 15.6 % — ABNORMAL HIGH (ref 11.5–14.6)
WBC: 7.4 10*3/uL (ref 4.5–10.5)

## 2010-10-02 LAB — BASIC METABOLIC PANEL
BUN: 22 mg/dL (ref 6–23)
CO2: 28 mEq/L (ref 19–32)
Calcium: 9 mg/dL (ref 8.4–10.5)
Chloride: 107 mEq/L (ref 96–112)
Creatinine, Ser: 0.8 mg/dL (ref 0.4–1.2)
GFR: 75.81 mL/min (ref >60.00)
Glucose, Bld: 113 mg/dL — ABNORMAL HIGH (ref 70–99)
Potassium: 4.3 mEq/L (ref 3.5–5.1)
Sodium: 143 mEq/L (ref 135–145)

## 2010-10-02 LAB — BRAIN NATRIURETIC PEPTIDE: Pro B Natriuretic peptide (BNP): 48 pg/mL (ref 0.0–100.0)

## 2010-10-02 LAB — TSH: TSH: 0.26 u[IU]/mL — ABNORMAL LOW (ref 0.35–5.50)

## 2010-10-02 MED ORDER — VALSARTAN 160 MG PO TABS: ORAL_TABLET | ORAL | Status: DC

## 2010-10-02 NOTE — Patient Instructions (Addendum)
Change symbicort 160 Take 2 puffs first thing in am and then another 2 puffs about 12 hours later. And stop spiriva Work on inhaler technique:  relax and gently blow all the way out then take a nice smooth deep breath back in, triggering the inhaler at same time you start breathing in.  Hold for up to 5 seconds if you can.  Rinse and gargle with water when done   If your mouth or throat starts to bother you,   I suggest you time the inhaler to your dental care and after using the inhaler(s) brush teeth and tongue with a baking soda containing toothpaste and when you rinse this out, gargle with it first to see if this helps your mouth and throat.     GERD (REFLUX)  is an extremely common cause of respiratory symptoms, many times with no significant heartburn at all.    It can be treated with medication, but also with lifestyle changes including avoidance of late meals, excessive alcohol, smoking cessation, and avoid fatty foods, chocolate, peppermint, colas, red wine, and acidic juices such as orange juice.  NO MINT OR MENTHOL PRODUCTS SO NO COUGH DROPS  USE SUGARLESS CANDY INSTEAD (jolley ranchers or Stover's)  NO OIL BASED VITAMINS    Stop spiriva completely  Only use proair if can't your breath at rest.  Stop amlodipine and take diovan 160 2 each am  See Tammy NP w/in 2 weeks with all your medications, even over the counter meds, separated in two separate bags, the ones you take no matter what vs the ones you stop once you feel better and take only as needed when you feel you need them.   Tammy  will generate for you a new user friendly medication calendar that will put [place] all on the same page re: your medication use.     Without this process, it simply isn't possible to assure that we are providing  your outpatient care  with  the attention to detail we feel you deserve.   If we cannot assure that you're getting that kind of care,  then we cannot manage your problem effectively from this  clinic.  Once you have seen Tammy and we are sure that we're all on the same page with your medication use she will arrange follow up with me.

## 2010-10-02 NOTE — Telephone Encounter (Signed)
Called and spoke with pt and she stated that she takes the prozac 2 tablets daily---she wanted to make sure this was ok.  Stated that as long as she did not take more than the 2 tablets daily she was taking what was prescribed for her.  She is aware that her lab and cxr was ok per MW.

## 2010-10-02 NOTE — Assessment & Plan Note (Signed)
Symptoms are markedly disproportionate to objective findings and not clear this is a lung problem but pt does appear to have difficult airway management issues.   DDX of  difficult airways managment all start with A and  include Adherence, Ace Inhibitors, Acid Reflux, Active Sinus Disease, Alpha 1 Antitripsin deficiency, Anxiety masquerading as Airways dz,  ABPA,  allergy(esp in young), Aspiration (esp in elderly), Adverse effects of DPI,  Active smokers, plus two Bs  = Bronchiectasis and Beta blocker use..and one C= CHF   Adherence is always the initial "prime suspect" and is a multilayered concern that requires a "trust but verify" approach in every patient - starting with knowing how to use medications, especially inhalers, correctly, keeping up with refills and understanding the fundamental difference between maintenance and prns vs those medications only taken for a very short course and then stopped and not refilled. The proper method of use, as well as anticipated side effects, of this metered-dose inhaler are discussed and demonstrated to the patient. Improved to 75% with coaching  ? Adverse effects of dpi > try off spiriva  ? Acid reflux: try off amlodipine, max gerd rx and diet reviewed  ? CHF  BNP nl, cards w/u neg per pt who declined referral back to [facility]  See instructions for specific recommendations which were reviewed directly with the patient who was given a copy with highlighter outlining the key components.

## 2010-10-02 NOTE — Progress Notes (Signed)
Subjective:     Patient ID: Alexandria [name], female   DOB: 16-Feb-1936, 75 y.o.   MRN: [id number]  HPI  6  yowf quit smoking Dec 2006 with GOLD II COPD FEV1 1.42 (64%) in 2004    September 16, 2009 1st pulmonary office eval in emr  cc COPD followup. Pt last seen in May 2006. She c/o worsening SOB for the past several yrs. She gets out of breath walking up stairs and walking flat surface approx 500 ft. She states that hot/humid weather makes breathing worse. supposed to be taking spiriva and advair but not consistent. better after proaire though hfa technique margnial. Notes mild reflux/ dysphagia/ hoarseness also rec .stop advair  Symbicort 2 puffs first thing in am and 2 puffs again in pm about 12 hours later  Take spiriva after symbicort  if needed proaire 2 puffs every 4 hours with goal of needing it less than twice daily  Nexium Take one 30-60 min before first meal of the day   November 20, 2009 6 wk followup with PFT's. Pt states that her breathing has improved since last seen. Still gets SOB walking up stairs. She c/o "wheezing in throat". using proaire once a week but also waking up once a week but no need for night time proaire. rec  Work on inhaler technique:  try off spiriva  Return to office in 3 months, sooner if needed  If you continue to waken at night with breathing problems try taking pepcid ac 20 mg one at bedtime to see if this helps > did not restart spirva though worse off it, did not start pepcid despite worse breathing and night symptoms    January 13, 2010 Increased SOB x 3 wks. Also c/o chest tightness. did not restart spiriva did not take pepcid just loosing lots of tums. chest tightness variably responds to proaire, feels like a band around her chest bilaterally not localized, no assoc nausea, sleeping poorly x in recliner. better on relative's cpap machine. very stressed out. no purulent sputum.  10/02/2010 ov/Steffanie Mingle cc sob/hoarseness  x 6 months gradually worse to point  where having trouble getting into store. Sleep fine on cpap, feel rested in am but sleeping in chair downstairs.   On spiriva once daily and symbicort 2 each am, some better with rescue saba but only uses it once or twice a week.  No purulent sputum. Some chest/ neck tightness with ex but says exactly the same symptoms prev eval by [facility] in [facility] neg for angina per pt  Pt denies any significant sore throat, dysphagia, itching, sneezing,  nasal congestion or excess/ purulent secretions,  fever, chills, sweats, unintended wt loss, pleuritic or exertional cp, hempoptysis, orthopnea pnd or leg swelling.    Also denies any obvious fluctuation of symptoms with weather or environmental changes or other aggravating or alleviating factors.        Past Medical History:  Depression (12/07/1998)  Hypothyroidism (03/08/1968)  Hypertension (03/08/1992)  Hyperlipidemia (10/04/2000)  Diabetes mellitus, type II (02/05/2006)- Dr. Leslie Dales with endo  COPD (03/08/1998)  - PFT's 12/12/2002 FEV1 1.42 (64%) ratio 58 with no better after B2 and DLCO75%  - PFT's 9/ 15/11 FEV1 1.50 (73%) ratio 50 no better after B2 with DLC0 62%  - hfa 50% November 20, 2009 > 75% January 13, 2010 p coaching >  75% 10/02/2010  VENTRAL HERNIA (ICD-553.20)  ESOPHAGITIS (ICD-530.10)  EDEMA (ICD-782.3)  SOB (ICD-786.05)  ALLERGY (ICD-995.3)  BRONCHITIS, OBSTRUCTIVE CHRONIC W/EXACRB (ICD-491.21)  ROTATOR  CUFF SYNDROME, RIGHT (ICD-726.10)  SYNDROME, ROTATOR CUFF NOS (ICD-726.10)  OBESITY NOS (ICD-278.00)  NUMBNESS (ICD-782.0)  POSTMENOPAUSAL ON HORMONE REPLACEMENT THERAPY (ICD-V07.4)     Family History:  Father dec 75 Emphysema One lung  Mother dec 93 Heart Thyrroid Dz  Brother A 37 Unknown  Brother A 66 Osteo Hyperthyr  Brother A 62 BACK Probs  Sister A 60 Breast Cysts Fibro Back Probs Hypothyr  no esophageal cancer  No known head and neck cancers.  no premature ascvd    Social History:  Reviewed history from  09/16/2009 and no changes required.  married, 2 children, she is retired, former smoker, drinks 2 alcoholic beverages per week, drinks 4 caffeinated beverages per day. Enjoys painting.  Former smoker. Quit on July 9th 2011. She smoked 40 yrs up to 1 ppd  .   Review of Systems     Objective:   Physical Exam    very hoarse anxious wf with classic pseudowheeze improves with purse lip maneuver / classic voice fatigue wt 229 September 16, 2009 > 227 November 20, 2009 > 231 January 13, 2010 > 232 10/02/2010  HEENT mild turbinate edema. Oropharynx no thrush or excess pnd or cobblestoning. No JVD or cervical adenopathy. Mild accessory muscle hypertrophy. Trachea midline, nl thryroid. Chest was hyperinflated by percussion with diminished breath sounds and moderate increased exp time without wheeze. Hoover sign positive at mid inspiration. Regular rate and rhythm without murmur gallop or rub or increase P2 or edema. Abd: no hsm, nl excursion. Ext warm without cyanosis or clubbing  cxr 10/02/2010  Stable chest x-ray with slight hyperaeration. No active lung disease.  Labs nl including bnp   Ekg p walking neg ischemia Assessment:         Plan:

## 2010-10-05 NOTE — Progress Notes (Signed)
Quick Note:  Spoke with pt and notified of results per Dr. Wert. Pt verbalized understanding and denied any questions.  ______ 

## 2010-10-06 LAB — ALLERGY PROFILE REGION II-DC, DE, MD, ~~LOC~~, VA
Allergen, D pternoyssinus,d7: <0.1 kU/L (ref <0.35)
Alternaria Alternata: <0.1 kU/L (ref <0.35)
Aspergillus fumigatus, IgG: <0.1 kU/L (ref <0.35)
Bermuda Grass: <0.1 kU/L (ref <0.35)
Box Elder IgE: <0.1 kU/L (ref <0.35)
Cat Dander: <0.1 kU/L (ref <0.35)
Cladosporium Herbarum: <0.1 kU/L (ref <0.35)
Cockroach: <0.1 kU/L (ref <0.35)
Common Ragweed: <0.1 kU/L (ref <0.35)
D. farinae: <0.1 kU/L (ref <0.35)
Dog Dander: <0.1 kU/L (ref <0.35)
Elm IgE: <0.1 kU/L (ref <0.35)
IgE (Immunoglobulin E), Serum: 8.1 IU/mL (ref 0.0–180.0)
Johnson Grass: <0.1 kU/L (ref <0.35)
Lamb's Quarters: <0.1 kU/L (ref <0.35)
Meadow Grass: <0.1 kU/L (ref <0.35)
Oak: <0.1 kU/L (ref <0.35)
Pecan/Hickory Tree IgE: <0.1 kU/L (ref <0.35)

## 2010-10-07 NOTE — Progress Notes (Signed)
Quick Note:  Spoke with pt and notified of results per Dr. Wert. Pt verbalized understanding and denied any questions.  ______ 

## 2010-10-16 ENCOUNTER — Encounter: Payer: Medicare Other | Admitting: Adult Health

## 2010-10-19 ENCOUNTER — Ambulatory Visit (INDEPENDENT_AMBULATORY_CARE_PROVIDER_SITE_OTHER): Payer: Medicare Other | Admitting: Adult Health

## 2010-10-19 ENCOUNTER — Encounter: Payer: Self-pay | Admitting: Adult Health

## 2010-10-19 DIAGNOSIS — R0602 Shortness of breath: Secondary | ICD-10-CM

## 2010-10-19 DIAGNOSIS — J449 Chronic obstructive pulmonary disease, unspecified: Secondary | ICD-10-CM

## 2010-10-19 NOTE — Patient Instructions (Signed)
Continue on current regimen  Follow med calendar closely and bring to each visit.  follow up Dr. Wert  In 6 weeks and As needed    

## 2010-10-21 ENCOUNTER — Other Ambulatory Visit: Payer: Self-pay | Admitting: Family Medicine

## 2010-10-21 ENCOUNTER — Other Ambulatory Visit: Payer: Self-pay | Admitting: *Deleted

## 2010-10-21 MED ORDER — IBUPROFEN 200 MG PO TABS: ORAL_TABLET | ORAL | Status: DC

## 2010-10-21 MED ORDER — L-METHYLFOLATE-B6-B12 3-35-2 MG PO TABS: 1.0000 | ORAL_TABLET | Freq: Two times a day (BID) | ORAL | Status: DC

## 2010-10-21 MED ORDER — EZETIMIBE 10 MG PO TABS: 10.0000 mg | ORAL_TABLET | Freq: Every day | ORAL | Status: DC

## 2010-10-21 MED ORDER — FLUTICASONE PROPIONATE 50 MCG/ACT NA SUSP: 2.0000 | Freq: Two times a day (BID) | NASAL | Status: DC | PRN

## 2010-10-21 MED ORDER — SITAGLIPTIN PHOSPHATE 100 MG PO TABS: 100.0000 mg | ORAL_TABLET | Freq: Every day | ORAL | Status: DC

## 2010-10-21 MED ORDER — CAPSAICIN 0.025 % EX CREA: TOPICAL_CREAM | CUTANEOUS | Status: AC | PRN

## 2010-10-21 MED ORDER — ALPRAZOLAM 0.25 MG PO TABS: 0.2500 mg | ORAL_TABLET | Freq: Three times a day (TID) | ORAL | Status: DC | PRN

## 2010-10-21 MED ORDER — VITAMIN B-12 1000 MCG PO TABS: 1000.0000 ug | ORAL_TABLET | Freq: Every day | ORAL | Status: DC

## 2010-10-21 MED ORDER — UNABLE TO FIND: Status: DC

## 2010-10-21 NOTE — Telephone Encounter (Signed)
Patient probably needs to be seen, please advise.

## 2010-10-21 NOTE — Telephone Encounter (Signed)
Please set up OV.  Thanks.  rx sent.

## 2010-10-21 NOTE — Assessment & Plan Note (Signed)
Compensated on present regimen.  Patient's medications were reviewed today and patient education was given. Computerized medication calendar was adjusted/completed  Plan Continue on current regimen  Follow med calendar closely and bring to each visit.  follow up Dr. Wert  In 6 weeks and As needed     

## 2010-10-21 NOTE — Progress Notes (Signed)
Subjective:     Patient ID: Alexandria [name], female   DOB: Jul 21, 1935, 75 y.o.   MRN: [id number]  HPI  50  yowf quit smoking Dec 2006 with GOLD II COPD FEV1 1.42 (64%) in 2004    September 16, 2009 1st pulmonary office eval in emr  cc COPD followup. Pt last seen in May 2006. She c/o worsening SOB for the past several yrs. She gets out of breath walking up stairs and walking flat surface approx 500 ft. She states that hot/humid weather makes breathing worse. supposed to be taking spiriva and advair but not consistent. better after proaire though hfa technique margnial. Notes mild reflux/ dysphagia/ hoarseness also rec .stop advair  Symbicort 2 puffs first thing in am and 2 puffs again in pm about 12 hours later  Take spiriva after symbicort  if needed proaire 2 puffs every 4 hours with goal of needing it less than twice daily  Nexium Take one 30-60 min before first meal of the day   November 20, 2009 6 wk followup with PFT's. Pt states that her breathing has improved since last seen. Still gets SOB walking up stairs. She c/o "wheezing in throat". using proaire once a week but also waking up once a week but no need for night time proaire. rec  Work on inhaler technique:  try off spiriva  Return to office in 3 months, sooner if needed  If you continue to waken at night with breathing problems try taking pepcid ac 20 mg one at bedtime to see if this helps > did not restart spirva though worse off it, did not start pepcid despite worse breathing and night symptoms    January 13, 2010 Increased SOB x 3 wks. Also c/o chest tightness. did not restart spiriva did not take pepcid just loosing lots of tums. chest tightness variably responds to proaire, feels like a band around her chest bilaterally not localized, no assoc nausea, sleeping poorly x in recliner. better on relative's cpap machine. very stressed out. no purulent sputum.  10/02/2010 ov/Wert cc sob/hoarseness  x 6 months gradually worse to point  where having trouble getting into store. Sleep fine on cpap, feel rested in am but sleeping in chair downstairs.   On spiriva once daily and symbicort 2 each am, some better with rescue saba but only uses it once or twice a week.  No purulent sputum. Some chest/ neck tightness with ex but says exactly the same symptoms prev eval by [facility] in [facility] neg for angina per pt  10/19/10 Follow up and med review  Pt returns for follow up and med review. We reviewed her meds and organized her meds into a med calendar with pt education Says she is doing well.  Last visit. Symbicort increased to 2 puffs Twice daily  . No flare in cough or wheezing. Dyspnea is at baseline.  No hemoptysis.      Past Medical History:  Depression (12/07/1998)  Hypothyroidism (03/08/1968)  Hypertension (03/08/1992)  Hyperlipidemia (10/04/2000)  Diabetes mellitus, type II (02/05/2006)- Dr. Leslie Dales with endo  COPD (03/08/1998)  - PFT's 12/12/2002 FEV1 1.42 (64%) ratio 58 with no better after B2 and DLCO75%  - PFT's 9/ 15/11 FEV1 1.50 (73%) ratio 50 no better after B2 with DLC0 62%  - hfa 50% November 20, 2009 > 75% January 13, 2010 p coaching >  75% 10/02/2010  VENTRAL HERNIA (ICD-553.20)  ESOPHAGITIS (ICD-530.10)  EDEMA (ICD-782.3)  SOB (ICD-786.05)  ALLERGY (ICD-995.3)  BRONCHITIS, OBSTRUCTIVE CHRONIC  W/EXACRB (ICD-491.21)  ROTATOR CUFF SYNDROME, RIGHT (ICD-726.10)  SYNDROME, ROTATOR CUFF NOS (ICD-726.10)  OBESITY NOS (ICD-278.00)  NUMBNESS (ICD-782.0)  POSTMENOPAUSAL ON HORMONE REPLACEMENT THERAPY (ICD-V07.4)  Complex med regimen , med calendar 10/19/10     Family History:  Father dec 75 Emphysema One lung  Mother dec 93 Heart Thyrroid Dz  Brother A 60 Unknown  Brother A 66 Osteo Hyperthyr  Brother A 62 BACK Probs  Sister A 60 Breast Cysts Fibro Back Probs Hypothyr  no esophageal cancer  No known head and neck cancers.  no premature ascvd    Social History:  Reviewed history from 09/16/2009 and no  changes required.  married, 2 children, she is retired, former smoker, drinks 2 alcoholic beverages per week, drinks 4 caffeinated beverages per day. Enjoys painting.  Former smoker. Quit on July 9th 2011. She smoked 40 yrs up to 1 ppd  .   Review of Systems Constitutional:   No  weight loss, night sweats,  Fevers, chills, fatigue, or  lassitude.  HEENT:   No headaches,  Difficulty swallowing,  Tooth/dental problems, or  Sore throat,                No sneezing, itching, ear ache, nasal congestion, post nasal drip,   CV:  No chest pain,  Orthopnea, PND, swelling in lower extremities, anasarca, dizziness, palpitations, syncope.   GI  No heartburn, indigestion, abdominal pain, nausea, vomiting, diarrhea, change in bowel habits, loss of appetite, bloody stools.   Resp: No coughing up of blood.    No chest wall deformity  Skin: no rash or lesions.  GU: no dysuria, change in color of urine, no urgency or frequency.  No flank pain, no hematuria   MS:  No joint pain or swelling.  No decreased range of motion.   Psych:  No change in mood or affect. No depression or anxiety.         Objective:   Physical Exam  75 yo female , NAD  wt 229 September 16, 2009 > 227 November 20, 2009 > 231 January 13, 2010 > 232 10/02/2010 >235 8/13 HEENT mild turbinate edema. Oropharynx no thrush or excess pnd or cobblestoning. No JVD or cervical adenopathy. Mild accessory muscle hypertrophy. Trachea midline, nl thryroid.  Regular rate and rhythm without murmur gallop or rub or increase P2 or edema. Abd: no hsm, nl excursion. Ext warm without cyanosis or clubbing  Assessment:         Plan:

## 2010-10-21 NOTE — Progress Notes (Signed)
8.13.12 med calendar update 

## 2010-10-22 NOTE — Telephone Encounter (Signed)
Offered to make an appt for the patient.  She prefers to call back after reviewing her calendar and will schedule something in September.  I reiterated the fact that she should ask for a 30 minute appt at Dr. Lianne Bushy request.

## 2010-10-27 ENCOUNTER — Other Ambulatory Visit: Payer: Self-pay | Admitting: Adult Health

## 2010-10-27 ENCOUNTER — Telehealth: Payer: Self-pay | Admitting: Adult Health

## 2010-10-27 DIAGNOSIS — J449 Chronic obstructive pulmonary disease, unspecified: Secondary | ICD-10-CM

## 2010-10-27 NOTE — Telephone Encounter (Signed)
Spoke with pt. Was seen by TP on 10/19/10 and was told would be referred to pulmonary rehab. I see in the ov note that the order was placed, but do not see any referral notes. I advised the pt that I will check and see if order is in process or not and call her back. I advised that once order is sent over to rehab they will contact her and that this can take up to 6 wks. Pt verbalized understanding. Libby or Monroe, can you tell if this has been done? Please advise, thanks!

## 2010-10-27 NOTE — Telephone Encounter (Signed)
Contacted patient and advised patient that Jamesetta So [facility] would contact her to arrange pulmonary rehab in about 3 wks. Advised patient that if she hasn't heard from pulmonary rehab in this time, to please call us back and we would f/u on order. Pt is aware and order has been faxed to pulmonary rehab. Rhonda J Cobb

## 2010-10-27 NOTE — Telephone Encounter (Signed)
Order was not placed correctly in epic. Alexandria [name] will place order correctly and then I will fax over referral to pulmonary rehab. Once referral has been faxed, pt will need to allow approx. 3-4 weeks before she will hear from [facility].

## 2010-11-24 ENCOUNTER — Ambulatory Visit (INDEPENDENT_AMBULATORY_CARE_PROVIDER_SITE_OTHER): Payer: Medicare Other | Admitting: Internal Medicine

## 2010-11-24 ENCOUNTER — Encounter: Payer: Self-pay | Admitting: Internal Medicine

## 2010-11-24 VITALS — BP 138/70 | HR 89 | Temp 97.7°F | Wt 235.0 lb

## 2010-11-24 DIAGNOSIS — J449 Chronic obstructive pulmonary disease, unspecified: Secondary | ICD-10-CM

## 2010-11-24 MED ORDER — IPRATROPIUM-ALBUTEROL 18-103 MCG/ACT IN AERO: 2.0000 | INHALATION_SPRAY | RESPIRATORY_TRACT | Status: DC | PRN

## 2010-11-24 NOTE — Progress Notes (Signed)
Subjective:     Patient ID: Alexandria [name], female   DOB: [birth year], 75 y.o.   MRN: [id number]  HPI  22  yowf quit smoking Dec 2006 with GOLD II COPD FEV1 1.42 (64%) in 2004    September 16, 2009 1st pulmonary office eval in em[place]  cc COPD followup. Pt last seen in May 2006. She c/o worsening SOB for the past several yrs. She gets out of breath walking up stairs and walking flat surface approx 500 ft. She states that hot/humid weather makes breathing worse. supposed to be taking spiriva and advair but not consistent. better after proaire though hfa technique margnial. Notes mild reflux/ dysphagia/ hoarseness also rec .stop advair  Symbicort 2 puffs first thing in am and 2 puffs again in pm about 12 hours later  Take spiriva after symbicort  if needed proaire 2 puffs every 4 hours with goal of needing it less than twice daily  Nexium Take one 30-60 min before first meal of the day   November 20, 2009 6 wk followup with PFT's. Pt states that her breathing has improved since last seen. Still gets SOB walking up stairs. She c/o "wheezing in throat". using proaire once a week but also waking up once a week but no need for night time proaire. rec  Work on inhaler technique:  try off spiriva  Return to office in 3 months, sooner if needed  If you continue to waken at night with breathing problems try taking pepcid ac 20 mg one at bedtime to see if this helps > did not restart spirva though worse off it, did not start pepcid despite worse breathing and night symptoms    January 13, 2010 Increased SOB x 3 wks. Also c/o chest tightness. did not restart spiriva did not take pepcid just loosing lots of tums. chest tightness variably responds to proaire, feels like a band around her chest bilaterally not localized, no assoc nausea, sleeping poorly x in recliner. better on relative's cpap machine. very stressed out. no purulent sputum.  10/02/2010 ov/Wert cc sob/hoarseness  x 6 months gradually worse to point  where having trouble getting into store. Sleep fine on cpap, feel rested in am but sleeping in chair downstairs.   On spiriva once daily and symbicort 2 each am, some better with rescue saba but only uses it once or twice a week.  No purulent sputum. Some chest/ neck tightness with ex but says exactly the same symptoms prev eval by [facility] in [facility] neg for angina per pt  10/19/10 Follow up and med review  Pt returns for follow up and med review. We reviewed her meds and organized her meds into a med calendar with pt education Says she is doing well.  Last visit. Symbicort increased to 2 puffs Twice daily  . No flare in cough or wheezing. Dyspnea is at baseline.  No hemoptysis.  rec Continue on current regimen.  Follow med calendar closely and bring to each visit.    11/24/2010 f/u ov/Wert cc breathing no better and using excess saba but no increase cough or purulent secretions or overt HB on present complex regimen which appears to be following per med calendar  Sleeping ok without nocturnal  or early am exacerbation  of respiratory  c/o's or need for noct saba. Also denies any obvious fluctuation of symptoms with weather or environmental changes or other aggravating or alleviating factors except as outlined above   ROS  At present neg for  any  significant sore throat, dysphagia, itching, sneezing,  nasal congestion or excess/ purulent secretions,  fever, chills, sweats, unintended wt loss, pleuritic or exertional cp, hempoptysis, orthopnea pnd or leg swelling.  Also denies presyncope, palpitations, heartburn, abdominal pain, nausea, vomiting, diarrhea  or change in bowel or urinary habits, dysuria,hematuria,  rash, arthralgias, visual complaints, headache, numbness weakness or ataxia.        Past Medical History:  Depression (12/07/1998)  Hypothyroidism (03/08/1968)  Hypertension (03/08/1992)  Hyperlipidemia (10/04/2000)  Diabetes mellitus, type II (02/05/2006)- Dr. Leslie Dales with endo    COPD (03/08/1998)  - PFT's 12/12/2002 FEV1 1.42 (64%) ratio 58 with no better after B2 and DLCO75%  - PFT's 9/ 15/11 FEV1 1.50 (73%) ratio 50 no better after B2 with DLC0 62%  - hfa 50% November 20, 2009 > 75% January 13, 2010 p coaching >  75% 10/02/2010  VENTRAL HERNIA (ICD-553.20)  ESOPHAGITIS (ICD-530.10)  EDEMA (ICD-782.3)  SOB (ICD-786.05)  ALLERGY (ICD-995.3)  BRONCHITIS, OBSTRUCTIVE CHRONIC W/EXACRB (ICD-491.21)  ROTATOR CUFF SYNDROME, RIGHT (ICD-726.10)  SYNDROME, ROTATOR CUFF NOS (ICD-726.10)  OBESITY NOS (ICD-278.00)  NUMBNESS (ICD-782.0)  POSTMENOPAUSAL ON HORMONE REPLACEMENT THERAPY (ICD-V07.4)  Complex med regimen , med calendar 10/19/10     Family History:  Father dec 75 Emphysema One lung  Mother dec 93 Heart Thyrroid Dz  Brother A 61 Unknown  Brother A 66 Osteo Hyperthyr  Brother A 62 BACK Probs  Sister A 60 Breast Cysts Fibro Back Probs Hypothyr  no esophageal cancer  No known head and neck cancers.  no premature ascvd    Social History:  Reviewed history from 09/16/2009 and no changes required.  married, 2 children, she is retired, former smoker, drinks 2 alcoholic beverages per week, drinks 4 caffeinated beverages per day. Enjoys painting.  Former smoker. Quit on July 9th 2011. She smoked 40 yrs up to 1 ppd           Objective:   Physical Exam  Elderly amb wf NAD  wt 229 September 16, 2009 > 227 November 20, 2009 > 231 January 13, 2010 >235 10/19/10 >  11/24/2010  235 HEENT mild turbinate edema. Oropharynx no thrush or excess pnd or cobblestoning. No JVD or cervical adenopathy. Mild accessory muscle hypertrophy. Trachea midline, nl thryroid.  Regular rate and rhythm without murmur gallop or rub or increase P2 or edema. Abd: no hsm, nl excursion. Ext warm without cyanosis or clubbing  Assessment:         Plan:

## 2010-11-24 NOTE — Patient Instructions (Signed)
Work on inhaler technique:  relax and gently blow all the way out then take a nice smooth deep breath back in, triggering the inhaler at same time you start breathing in.  Hold for up to 5 seconds if you can.  Rinse and gargle with water when done   If your mouth or throat starts to bother you,   I suggest you time the inhaler to your dental care and after using the inhaler(s) brush teeth and tongue with a baking soda containing toothpaste and when you rinse this out, gargle with it first to see if this helps your mouth and throat.     GERD (REFLUX)  is an extremely common cause of respiratory symptoms, many times with no significant heartburn at all.    It can be treated with medication, but also with lifestyle changes including avoidance of late meals, excessive alcohol, smoking cessation, and avoid fatty foods, chocolate, peppermint, colas, red wine, and acidic juices such as orange juice.  NO MINT OR MENTHOL PRODUCTS SO NO COUGH DROPS  USE SUGARLESS CANDY INSTEAD (jolley ranchers or Stover's)  NO OIL BASED VITAMINS      Only use proair/combent  if can't catch  your breath at rest.    We will check your rehab referral and call you   Prednisone x 6 day taper off   Try Combivent 2 puffs every 4 hours in place of proaire should work better and last longer   Follow up in 6 weeks sooner if needed

## 2010-11-25 ENCOUNTER — Encounter: Payer: Self-pay | Admitting: Internal Medicine

## 2010-11-25 NOTE — Assessment & Plan Note (Signed)
DDX of  difficult airways managment all start with A and  include Adherence, Ace Inhibitors, Acid Reflux, Active Sinus Disease, Alpha 1 Antitripsin deficiency, Anxiety masquerading as Airways dz,  ABPA,  allergy(esp in young), Aspiration (esp in elderly), Adverse effects of DPI,  Active smokers, plus two Bs  = Bronchiectasis and Beta blocker use..and one C= CHF   In this case Adherence is the biggest issue and starts with  inability to use HFA effectively and also  understand that SABA treats the symptoms but doesn't get to the underlying problem (inflammation).  I used  the analogy of putting steroid cream on a rash to help explain the meaning of topical therapy and the need to get the drug to the target tissue.  The proper method of use, as well as anticipated side effects, of this metered-dose inhaler are discussed and demonstrated to the patient. Improved to 90% with coaching so try change proaire to combivent  ? Acid reflux> reviewed rx  ? Anxiety/ 0besity/ deconditioning > refer back to rehab

## 2010-12-02 ENCOUNTER — Encounter (HOSPITAL_COMMUNITY)
Admission: RE | Admit: 2010-12-02 | Discharge: 2010-12-02 | Payer: BC Managed Care – PPO | Source: Ambulatory Visit | Attending: Internal Medicine | Admitting: Internal Medicine

## 2010-12-02 ENCOUNTER — Telehealth (HOSPITAL_COMMUNITY): Payer: Self-pay | Admitting: *Deleted

## 2010-12-03 ENCOUNTER — Encounter (HOSPITAL_COMMUNITY): Payer: BC Managed Care – PPO | Attending: Internal Medicine

## 2010-12-03 DIAGNOSIS — E039 Hypothyroidism, unspecified: Secondary | ICD-10-CM | POA: Insufficient documentation

## 2010-12-03 DIAGNOSIS — J449 Chronic obstructive pulmonary disease, unspecified: Secondary | ICD-10-CM | POA: Insufficient documentation

## 2010-12-03 DIAGNOSIS — I10 Essential (primary) hypertension: Secondary | ICD-10-CM | POA: Insufficient documentation

## 2010-12-03 DIAGNOSIS — E785 Hyperlipidemia, unspecified: Secondary | ICD-10-CM | POA: Insufficient documentation

## 2010-12-03 DIAGNOSIS — E119 Type 2 diabetes mellitus without complications: Secondary | ICD-10-CM | POA: Insufficient documentation

## 2010-12-03 DIAGNOSIS — G4733 Obstructive sleep apnea (adult) (pediatric): Secondary | ICD-10-CM | POA: Insufficient documentation

## 2010-12-03 DIAGNOSIS — E669 Obesity, unspecified: Secondary | ICD-10-CM | POA: Insufficient documentation

## 2010-12-03 DIAGNOSIS — Z5189 Encounter for other specified aftercare: Secondary | ICD-10-CM | POA: Insufficient documentation

## 2010-12-03 DIAGNOSIS — J4489 Other specified chronic obstructive pulmonary disease: Secondary | ICD-10-CM | POA: Insufficient documentation

## 2010-12-08 ENCOUNTER — Encounter (HOSPITAL_COMMUNITY): Payer: BC Managed Care – PPO | Attending: Internal Medicine

## 2010-12-08 DIAGNOSIS — G4733 Obstructive sleep apnea (adult) (pediatric): Secondary | ICD-10-CM | POA: Insufficient documentation

## 2010-12-08 DIAGNOSIS — I10 Essential (primary) hypertension: Secondary | ICD-10-CM | POA: Insufficient documentation

## 2010-12-08 DIAGNOSIS — E119 Type 2 diabetes mellitus without complications: Secondary | ICD-10-CM | POA: Insufficient documentation

## 2010-12-08 DIAGNOSIS — E785 Hyperlipidemia, unspecified: Secondary | ICD-10-CM | POA: Insufficient documentation

## 2010-12-08 DIAGNOSIS — J449 Chronic obstructive pulmonary disease, unspecified: Secondary | ICD-10-CM | POA: Insufficient documentation

## 2010-12-08 DIAGNOSIS — E039 Hypothyroidism, unspecified: Secondary | ICD-10-CM | POA: Insufficient documentation

## 2010-12-08 DIAGNOSIS — E669 Obesity, unspecified: Secondary | ICD-10-CM | POA: Insufficient documentation

## 2010-12-08 DIAGNOSIS — Z5189 Encounter for other specified aftercare: Secondary | ICD-10-CM | POA: Insufficient documentation

## 2010-12-08 DIAGNOSIS — J4489 Other specified chronic obstructive pulmonary disease: Secondary | ICD-10-CM | POA: Insufficient documentation

## 2010-12-10 ENCOUNTER — Encounter (HOSPITAL_COMMUNITY): Payer: BC Managed Care – PPO

## 2010-12-15 ENCOUNTER — Encounter (HOSPITAL_COMMUNITY): Payer: BC Managed Care – PPO

## 2010-12-15 LAB — I-STAT 8, (EC8 V) (CONVERTED LAB)
Acid-Base Excess: 2
BUN: 18
Bicarbonate: 27.9 — ABNORMAL HIGH
Chloride: 106
Glucose, Bld: 111 — ABNORMAL HIGH
HCT: 43
Hemoglobin: 14.6
Operator id: 268271
Potassium: 3.9
Sodium: 141
TCO2: 29
pCO2, Ven: 46.9
pH, Ven: 7.383 — ABNORMAL HIGH

## 2010-12-17 ENCOUNTER — Encounter (HOSPITAL_COMMUNITY): Payer: BC Managed Care – PPO

## 2010-12-18 ENCOUNTER — Telehealth: Payer: Self-pay | Admitting: Internal Medicine

## 2010-12-18 NOTE — Telephone Encounter (Signed)
I spoke with pt and she states she wanted to know if we had record when her last flu shot was. I advised her according to our charts it showed 01/06/10. Pt verbalized understanding and needed nothing else further

## 2010-12-22 ENCOUNTER — Encounter (HOSPITAL_COMMUNITY): Payer: BC Managed Care – PPO

## 2010-12-23 ENCOUNTER — Encounter: Payer: Self-pay | Admitting: Family Medicine

## 2010-12-23 ENCOUNTER — Other Ambulatory Visit: Payer: Self-pay | Admitting: *Deleted

## 2010-12-23 ENCOUNTER — Ambulatory Visit (INDEPENDENT_AMBULATORY_CARE_PROVIDER_SITE_OTHER): Payer: BC Managed Care – PPO | Admitting: Family Medicine

## 2010-12-23 VITALS — BP 126/60 | HR 87 | Temp 97.7°F | Wt 237.0 lb

## 2010-12-23 DIAGNOSIS — F329 Major depressive disorder, single episode, unspecified: Secondary | ICD-10-CM

## 2010-12-23 DIAGNOSIS — G4733 Obstructive sleep apnea (adult) (pediatric): Secondary | ICD-10-CM

## 2010-12-23 DIAGNOSIS — Z23 Encounter for immunization: Secondary | ICD-10-CM

## 2010-12-23 DIAGNOSIS — E1149 Type 2 diabetes mellitus with other diabetic neurological complication: Secondary | ICD-10-CM

## 2010-12-23 DIAGNOSIS — E785 Hyperlipidemia, unspecified: Secondary | ICD-10-CM

## 2010-12-23 DIAGNOSIS — E669 Obesity, unspecified: Secondary | ICD-10-CM

## 2010-12-23 DIAGNOSIS — E039 Hypothyroidism, unspecified: Secondary | ICD-10-CM

## 2010-12-23 MED ORDER — FLUTICASONE PROPIONATE 50 MCG/ACT NA SUSP: 2.0000 | Freq: Two times a day (BID) | NASAL | Status: DC | PRN

## 2010-12-23 MED ORDER — TETANUS-DIPHTHERIA TOXOIDS TD 2-2 LF/0.5ML IM SUSP: 0.5000 mL | Freq: Once | INTRAMUSCULAR | Status: DC

## 2010-12-23 MED ORDER — ALPRAZOLAM 0.5 MG PO TABS: 0.2500 mg | ORAL_TABLET | Freq: Every day | ORAL | Status: DC

## 2010-12-23 MED ORDER — FLUOXETINE HCL 10 MG PO CAPS: ORAL_CAPSULE | ORAL | Status: DC

## 2010-12-23 NOTE — Patient Instructions (Signed)
Talk to Terri in the lab and we'll send in your meds.  Take care and good luck with pulmonary rehab.   Let me know if you don't have some relief on the higher dose of prozac and alprazolam.

## 2010-12-23 NOTE — Assessment & Plan Note (Signed)
Per [facility].  

## 2010-12-23 NOTE — Assessment & Plan Note (Signed)
D/w pt about CV side effects of OSA and encouraged use of CPAP.

## 2010-12-23 NOTE — Assessment & Plan Note (Signed)
D/w pt about weight loss via diet and exercise.

## 2010-12-23 NOTE — Assessment & Plan Note (Signed)
Inc SSRI and use BZD prn.  She agrees, will call back if not improved.

## 2010-12-23 NOTE — Assessment & Plan Note (Signed)
Per [facility].

## 2010-12-23 NOTE — Progress Notes (Signed)
Anxiety and depression. No SI/HI.  Has been more worried about pulm status, see below, and has had some panic sx.  Had taken alprazolam with some relief.  Still on prozac.  Panic sx are brief.  Sleeping well when she uses CPAP.    COPD. In pulm rehab.  Not smoking. Using meds as above. We talked about physiology about pulm rehab and she understood.   DM/HLD/vit D def/thyroid per [facility].    Colon cancer screening.  D/w patient [id number] for colon cancer screening, including IFOB vs. colonoscopy.  Risks and benefits of both were discussed and patient voiced understanding.  Pt elects for: IFOB.  "I never did the cards when they talked to me before about it." If she were passing blood, then she'd be interested in GI eval.  She'll call gyn about mammogram and DXA.    PMH and SH reviewed  ROS: See HPI, otherwise noncontributory.  Meds, vitals, and allergies reviewed.   nad ncat Speech wnl No tremor. rrr No focal dec in bs, occ scant exp wheeze abd soft, obese Ext w/o edema

## 2010-12-24 ENCOUNTER — Encounter (HOSPITAL_COMMUNITY): Payer: BC Managed Care – PPO

## 2010-12-29 ENCOUNTER — Encounter (HOSPITAL_COMMUNITY): Payer: BC Managed Care – PPO

## 2010-12-31 ENCOUNTER — Encounter (HOSPITAL_COMMUNITY): Payer: BC Managed Care – PPO

## 2011-01-05 ENCOUNTER — Encounter (HOSPITAL_COMMUNITY): Payer: BC Managed Care – PPO

## 2011-01-07 ENCOUNTER — Encounter (HOSPITAL_COMMUNITY): Payer: BC Managed Care – PPO

## 2011-01-07 ENCOUNTER — Ambulatory Visit: Payer: Medicare Other | Admitting: Internal Medicine

## 2011-01-12 ENCOUNTER — Encounter (HOSPITAL_COMMUNITY): Payer: BC Managed Care – PPO

## 2011-01-14 ENCOUNTER — Encounter (HOSPITAL_COMMUNITY): Payer: BC Managed Care – PPO

## 2011-01-14 DIAGNOSIS — Z5189 Encounter for other specified aftercare: Secondary | ICD-10-CM | POA: Insufficient documentation

## 2011-01-14 DIAGNOSIS — I10 Essential (primary) hypertension: Secondary | ICD-10-CM | POA: Insufficient documentation

## 2011-01-14 DIAGNOSIS — G4733 Obstructive sleep apnea (adult) (pediatric): Secondary | ICD-10-CM | POA: Insufficient documentation

## 2011-01-14 DIAGNOSIS — E785 Hyperlipidemia, unspecified: Secondary | ICD-10-CM | POA: Insufficient documentation

## 2011-01-14 DIAGNOSIS — E119 Type 2 diabetes mellitus without complications: Secondary | ICD-10-CM | POA: Insufficient documentation

## 2011-01-14 DIAGNOSIS — J4489 Other specified chronic obstructive pulmonary disease: Secondary | ICD-10-CM | POA: Insufficient documentation

## 2011-01-14 DIAGNOSIS — J449 Chronic obstructive pulmonary disease, unspecified: Secondary | ICD-10-CM | POA: Insufficient documentation

## 2011-01-14 DIAGNOSIS — E669 Obesity, unspecified: Secondary | ICD-10-CM | POA: Insufficient documentation

## 2011-01-14 DIAGNOSIS — E039 Hypothyroidism, unspecified: Secondary | ICD-10-CM | POA: Insufficient documentation

## 2011-01-14 NOTE — Progress Notes (Signed)
Mrs Alexandria [name] staff today at entry to [facility] that she had "twisted" her knee at home and had support on.  Felt some soreness when she walked  Advised to do only arm exercise and limit walking.  She did feel more discomfort when Walking and exercise was stopped. Advised to ice her knee and call MD if no improvement.

## 2011-01-19 ENCOUNTER — Encounter (HOSPITAL_COMMUNITY): Payer: BC Managed Care – PPO

## 2011-01-19 NOTE — Progress Notes (Signed)
Completed home exercise with patient. Discussed exercise progression, type of safe exercises patient should do, patients' routine- (warm up, activity period, cool down),  RPE/Dyspnea Scale, how important it is to have a pulse oximeter with her at all times, environmental factors, warning signs and symptoms and when to call the MD.  Patient voiced understanding.Patients' long term goal is to work on her diet and weight loss. Patient set a goal to lose 12 pounds by the end of the year. Patient will follow up with RD as needed. Will continue to support and encourage.

## 2011-01-20 NOTE — Telephone Encounter (Signed)
Opened in error

## 2011-01-21 ENCOUNTER — Encounter (HOSPITAL_COMMUNITY)
Admission: RE | Admit: 2011-01-21 | Discharge: 2011-01-21 | Disposition: A | Discharge status: Home / Self Care | Payer: BC Managed Care – PPO | Source: Ambulatory Visit | Attending: Internal Medicine | Admitting: Internal Medicine

## 2011-01-26 ENCOUNTER — Encounter (HOSPITAL_COMMUNITY)
Admission: RE | Admit: 2011-01-26 | Discharge: 2011-01-26 | Disposition: A | Discharge status: Home / Self Care | Payer: BC Managed Care – PPO | Source: Ambulatory Visit | Attending: Internal Medicine | Admitting: Internal Medicine

## 2011-01-28 ENCOUNTER — Encounter (HOSPITAL_COMMUNITY): Payer: BC Managed Care – PPO

## 2011-02-02 ENCOUNTER — Encounter (HOSPITAL_COMMUNITY): Payer: BC Managed Care – PPO

## 2011-02-02 ENCOUNTER — Telehealth: Payer: Self-pay | Admitting: Internal Medicine

## 2011-02-02 NOTE — Telephone Encounter (Signed)
Did not keep f/u ov but would be happy to address this issue in the context of a face to face visit if we haven't met the goal of improving her function

## 2011-02-02 NOTE — Telephone Encounter (Signed)
Dr Sherene Sires are you okay with signing a handicap placard for her? Please advise, thanks!

## 2011-02-03 NOTE — Telephone Encounter (Signed)
LMTCB

## 2011-02-04 ENCOUNTER — Encounter (HOSPITAL_COMMUNITY): Admission: RE | Admit: 2011-02-04 | Payer: BC Managed Care – PPO | Source: Ambulatory Visit

## 2011-02-04 NOTE — Telephone Encounter (Signed)
Pt called back.  Informed her of MW's recs.  Pt states she is very confused by this.  She states every time she comes in for an appt with MW he asks if she has picked up a disability parking pass yet from her employer and so therefore pt states this is why she dropped this off for him to fill out.  Pt also states she cannot make an appt with MW at this time as she will be going out of state for the month d/t the holidays.  Mw, please advise.  Thanks.

## 2011-02-04 NOTE — Telephone Encounter (Signed)
Pt return call. Alexandria [name]  °

## 2011-02-04 NOTE — Telephone Encounter (Signed)
lmomtcb  

## 2011-02-05 NOTE — Telephone Encounter (Signed)
Discussed with pt, wants [facility] placcard for special occasions only, doing well in rehab which I encouraged her to continue

## 2011-02-09 ENCOUNTER — Encounter (HOSPITAL_COMMUNITY)
Admission: RE | Admit: 2011-02-09 | Discharge: 2011-02-09 | Disposition: A | Discharge status: Home / Self Care | Payer: BC Managed Care – PPO | Source: Ambulatory Visit | Attending: Internal Medicine | Admitting: Internal Medicine

## 2011-02-09 DIAGNOSIS — E119 Type 2 diabetes mellitus without complications: Secondary | ICD-10-CM | POA: Insufficient documentation

## 2011-02-09 DIAGNOSIS — I10 Essential (primary) hypertension: Secondary | ICD-10-CM | POA: Insufficient documentation

## 2011-02-09 DIAGNOSIS — J4489 Other specified chronic obstructive pulmonary disease: Secondary | ICD-10-CM | POA: Insufficient documentation

## 2011-02-09 DIAGNOSIS — Z5189 Encounter for other specified aftercare: Secondary | ICD-10-CM | POA: Insufficient documentation

## 2011-02-09 DIAGNOSIS — G4733 Obstructive sleep apnea (adult) (pediatric): Secondary | ICD-10-CM | POA: Insufficient documentation

## 2011-02-09 DIAGNOSIS — E039 Hypothyroidism, unspecified: Secondary | ICD-10-CM | POA: Insufficient documentation

## 2011-02-09 DIAGNOSIS — E669 Obesity, unspecified: Secondary | ICD-10-CM | POA: Insufficient documentation

## 2011-02-09 DIAGNOSIS — J449 Chronic obstructive pulmonary disease, unspecified: Secondary | ICD-10-CM | POA: Insufficient documentation

## 2011-02-09 DIAGNOSIS — E785 Hyperlipidemia, unspecified: Secondary | ICD-10-CM | POA: Insufficient documentation

## 2011-02-11 ENCOUNTER — Encounter (HOSPITAL_COMMUNITY)
Admission: RE | Admit: 2011-02-11 | Discharge: 2011-02-11 | Disposition: A | Discharge status: Home / Self Care | Payer: BC Managed Care – PPO | Source: Ambulatory Visit | Attending: Internal Medicine | Admitting: Internal Medicine

## 2011-02-16 ENCOUNTER — Encounter (HOSPITAL_COMMUNITY): Payer: BC Managed Care – PPO

## 2011-02-18 ENCOUNTER — Encounter (HOSPITAL_COMMUNITY): Payer: BC Managed Care – PPO

## 2011-02-23 ENCOUNTER — Encounter (HOSPITAL_COMMUNITY): Payer: BC Managed Care – PPO

## 2011-02-25 ENCOUNTER — Encounter (HOSPITAL_COMMUNITY): Payer: BC Managed Care – PPO

## 2011-03-02 ENCOUNTER — Encounter (HOSPITAL_COMMUNITY): Payer: BC Managed Care – PPO

## 2011-03-04 ENCOUNTER — Encounter (HOSPITAL_COMMUNITY): Payer: BC Managed Care – PPO

## 2011-03-08 ENCOUNTER — Telehealth: Payer: Self-pay | Admitting: Radiology

## 2011-03-08 NOTE — Telephone Encounter (Signed)
[facility] notified us that the patient never returned their ifob test, lmom for patient to return call.

## 2011-03-09 ENCOUNTER — Encounter (HOSPITAL_COMMUNITY): Payer: BC Managed Care – PPO

## 2011-03-09 DIAGNOSIS — Z5189 Encounter for other specified aftercare: Secondary | ICD-10-CM | POA: Insufficient documentation

## 2011-03-09 DIAGNOSIS — E039 Hypothyroidism, unspecified: Secondary | ICD-10-CM | POA: Insufficient documentation

## 2011-03-09 DIAGNOSIS — J4489 Other specified chronic obstructive pulmonary disease: Secondary | ICD-10-CM | POA: Insufficient documentation

## 2011-03-09 DIAGNOSIS — E669 Obesity, unspecified: Secondary | ICD-10-CM | POA: Insufficient documentation

## 2011-03-09 DIAGNOSIS — I10 Essential (primary) hypertension: Secondary | ICD-10-CM | POA: Insufficient documentation

## 2011-03-09 DIAGNOSIS — J449 Chronic obstructive pulmonary disease, unspecified: Secondary | ICD-10-CM | POA: Insufficient documentation

## 2011-03-09 DIAGNOSIS — E785 Hyperlipidemia, unspecified: Secondary | ICD-10-CM | POA: Insufficient documentation

## 2011-03-09 DIAGNOSIS — E119 Type 2 diabetes mellitus without complications: Secondary | ICD-10-CM | POA: Insufficient documentation

## 2011-03-09 DIAGNOSIS — G4733 Obstructive sleep apnea (adult) (pediatric): Secondary | ICD-10-CM | POA: Insufficient documentation

## 2011-03-09 NOTE — Telephone Encounter (Signed)
Please send a letter to pt if not returning calls.   

## 2011-03-11 ENCOUNTER — Encounter (HOSPITAL_COMMUNITY): Payer: BC Managed Care – PPO

## 2011-03-16 ENCOUNTER — Encounter (HOSPITAL_COMMUNITY): Payer: BC Managed Care – PPO

## 2011-03-17 NOTE — Telephone Encounter (Signed)
Please send letter to patient encouraging f/u [id number].

## 2011-03-17 NOTE — Telephone Encounter (Signed)
Patient never return my phone call.

## 2011-03-18 ENCOUNTER — Encounter: Payer: Self-pay | Admitting: *Deleted

## 2011-03-18 ENCOUNTER — Encounter (HOSPITAL_COMMUNITY)
Admission: RE | Admit: 2011-03-18 | Discharge: 2011-03-18 | Disposition: A | Discharge status: Home / Self Care | Payer: BC Managed Care – PPO | Source: Ambulatory Visit | Attending: Internal Medicine | Admitting: Internal Medicine

## 2011-03-18 DIAGNOSIS — G4733 Obstructive sleep apnea (adult) (pediatric): Secondary | ICD-10-CM | POA: Diagnosis not present

## 2011-03-18 DIAGNOSIS — Z5189 Encounter for other specified aftercare: Secondary | ICD-10-CM | POA: Diagnosis not present

## 2011-03-18 DIAGNOSIS — E785 Hyperlipidemia, unspecified: Secondary | ICD-10-CM | POA: Diagnosis not present

## 2011-03-18 DIAGNOSIS — E669 Obesity, unspecified: Secondary | ICD-10-CM | POA: Diagnosis not present

## 2011-03-18 DIAGNOSIS — J449 Chronic obstructive pulmonary disease, unspecified: Secondary | ICD-10-CM | POA: Diagnosis not present

## 2011-03-18 DIAGNOSIS — E039 Hypothyroidism, unspecified: Secondary | ICD-10-CM | POA: Diagnosis not present

## 2011-03-18 DIAGNOSIS — I10 Essential (primary) hypertension: Secondary | ICD-10-CM | POA: Diagnosis not present

## 2011-03-18 DIAGNOSIS — E119 Type 2 diabetes mellitus without complications: Secondary | ICD-10-CM | POA: Diagnosis not present

## 2011-03-18 NOTE — Telephone Encounter (Signed)
Letter mailed

## 2011-03-23 ENCOUNTER — Encounter (HOSPITAL_COMMUNITY)
Admission: RE | Admit: 2011-03-23 | Discharge: 2011-03-23 | Disposition: A | Discharge status: Home / Self Care | Payer: BC Managed Care – PPO | Source: Ambulatory Visit | Attending: Internal Medicine | Admitting: Internal Medicine

## 2011-03-25 ENCOUNTER — Encounter (HOSPITAL_COMMUNITY)
Admission: RE | Admit: 2011-03-25 | Discharge: 2011-03-25 | Disposition: A | Discharge status: Home / Self Care | Payer: BC Managed Care – PPO | Source: Ambulatory Visit | Attending: Internal Medicine | Admitting: Internal Medicine

## 2011-03-25 ENCOUNTER — Telehealth: Payer: Self-pay | Admitting: Internal Medicine

## 2011-03-25 NOTE — Telephone Encounter (Signed)
Agree she needs this but also f/u due - we can do this next ov at no charge

## 2011-03-25 NOTE — Telephone Encounter (Signed)
MW, I do not think we did alpha 1 on her. Can not tell from the records, please advise, thanks!

## 2011-03-26 NOTE — Telephone Encounter (Signed)
Called, spoke with pt. I informed pt MW agrees she needs an alpha 1 done but would like her to make an OV as she hasn't been seen since Sept.  Advised at this time, we can do the alpha 1 test at no charge to her.  She is ok with this.  OV scheduled for Apr 15, 2011 at 4:15 pm with MW.  Pt aware and will call back if anything further is needed prior to this.

## 2011-03-30 ENCOUNTER — Encounter (HOSPITAL_COMMUNITY)
Admission: RE | Admit: 2011-03-30 | Discharge: 2011-03-30 | Disposition: A | Discharge status: Home / Self Care | Payer: BC Managed Care – PPO | Source: Ambulatory Visit | Attending: Internal Medicine | Admitting: Internal Medicine

## 2011-04-01 ENCOUNTER — Encounter (HOSPITAL_COMMUNITY): Payer: BC Managed Care – PPO

## 2011-04-06 ENCOUNTER — Encounter (HOSPITAL_COMMUNITY)
Admission: RE | Admit: 2011-04-06 | Discharge: 2011-04-06 | Disposition: A | Discharge status: Home / Self Care | Payer: BC Managed Care – PPO | Source: Ambulatory Visit | Attending: Internal Medicine | Admitting: Internal Medicine

## 2011-04-08 ENCOUNTER — Encounter (HOSPITAL_COMMUNITY)
Admission: RE | Admit: 2011-04-08 | Discharge: 2011-04-08 | Disposition: A | Discharge status: Home / Self Care | Payer: BC Managed Care – PPO | Source: Ambulatory Visit | Attending: Internal Medicine | Admitting: Internal Medicine

## 2011-04-08 ENCOUNTER — Encounter (HOSPITAL_COMMUNITY): Payer: BC Managed Care – PPO

## 2011-04-13 ENCOUNTER — Encounter (HOSPITAL_COMMUNITY): Payer: BC Managed Care – PPO

## 2011-04-13 ENCOUNTER — Encounter (HOSPITAL_COMMUNITY)
Admission: RE | Admit: 2011-04-13 | Discharge: 2011-04-13 | Disposition: A | Discharge status: Home / Self Care | Payer: BC Managed Care – PPO | Source: Ambulatory Visit | Attending: Internal Medicine | Admitting: Internal Medicine

## 2011-04-13 DIAGNOSIS — E785 Hyperlipidemia, unspecified: Secondary | ICD-10-CM | POA: Insufficient documentation

## 2011-04-13 DIAGNOSIS — G4733 Obstructive sleep apnea (adult) (pediatric): Secondary | ICD-10-CM | POA: Insufficient documentation

## 2011-04-13 DIAGNOSIS — J449 Chronic obstructive pulmonary disease, unspecified: Secondary | ICD-10-CM | POA: Insufficient documentation

## 2011-04-13 DIAGNOSIS — E669 Obesity, unspecified: Secondary | ICD-10-CM | POA: Insufficient documentation

## 2011-04-13 DIAGNOSIS — J4489 Other specified chronic obstructive pulmonary disease: Secondary | ICD-10-CM | POA: Insufficient documentation

## 2011-04-13 DIAGNOSIS — I10 Essential (primary) hypertension: Secondary | ICD-10-CM | POA: Insufficient documentation

## 2011-04-13 DIAGNOSIS — E039 Hypothyroidism, unspecified: Secondary | ICD-10-CM | POA: Insufficient documentation

## 2011-04-13 DIAGNOSIS — Z5189 Encounter for other specified aftercare: Secondary | ICD-10-CM | POA: Insufficient documentation

## 2011-04-13 DIAGNOSIS — E119 Type 2 diabetes mellitus without complications: Secondary | ICD-10-CM | POA: Insufficient documentation

## 2011-04-15 ENCOUNTER — Encounter (HOSPITAL_COMMUNITY)
Admission: RE | Admit: 2011-04-15 | Discharge: 2011-04-15 | Disposition: A | Discharge status: Home / Self Care | Payer: BC Managed Care – PPO | Source: Ambulatory Visit | Attending: Internal Medicine | Admitting: Internal Medicine

## 2011-04-15 ENCOUNTER — Encounter (HOSPITAL_COMMUNITY): Payer: BC Managed Care – PPO

## 2011-04-15 ENCOUNTER — Encounter: Payer: Self-pay | Admitting: Internal Medicine

## 2011-04-15 ENCOUNTER — Ambulatory Visit (INDEPENDENT_AMBULATORY_CARE_PROVIDER_SITE_OTHER): Payer: BC Managed Care – PPO | Admitting: Internal Medicine

## 2011-04-15 VITALS — BP 130/72 | HR 76 | Temp 97.8°F | Ht 65.0 in | Wt 234.2 lb

## 2011-04-15 DIAGNOSIS — J449 Chronic obstructive pulmonary disease, unspecified: Secondary | ICD-10-CM

## 2011-04-15 NOTE — Progress Notes (Signed)
Subjective:     Patient ID: Alexandria [name], female   DOB: [birth year]    MRN: [id number]   Brief patient profile:  75  yowf quit smoking Dec 2006 with GOLD II COPD FEV1 1.42 (64%) in 2004   HPI September 16, 2009 1st pulmonary office eval in [facility]  cc COPD followup. Pt last seen in May 2006. She c/o worsening SOB for the past several yrs. She gets out of breath walking up stairs and walking flat surface approx 500 ft. She states that hot/humid weather makes breathing worse. supposed to be taking spiriva and advair but not consistent. better after proaire though hfa technique margnial. Notes mild reflux/ dysphagia/ hoarseness also rec .stop advair  Symbicort 2 puffs first thing in am and 2 puffs again in pm about 12 hours later  Take spiriva after symbicort  if needed proaire 2 puffs every 4 hours with goal of needing it less than twice daily  Nexium Take one 30-60 min before first meal of the day   November 20, 2009 6 wk followup with PFT's. Pt states that her breathing has improved since last seen. Still gets SOB walking up stairs. She c/o "wheezing in throat". using proaire once a week but also waking up once a week but no need for night time proaire. rec  Work on inhaler technique:  try off spiriva  Return to office in 3 months, sooner if needed  If you continue to waken at night with breathing problems try taking pepcid ac 20 mg one at bedtime to see if this helps > did not restart spirva though worse off it, did not start pepcid despite worse breathing and night symptoms    January 13, 2010 Increased SOB x 3 wks. Also c/o chest tightness. did not restart spiriva did not take pepcid just loosing lots of tums. chest tightness variably responds to proaire, feels like a band around her chest bilaterally not localized, no assoc nausea, sleeping poorly x in recliner. better on relative's cpap machine. very stressed out. no purulent sputum.  10/02/2010 ov/Ellagrace Yoshida cc sob/hoarseness  x 6 months gradually  worse to point where having trouble getting into store. Sleep fine on cpap, feel rested in am but sleeping in chair downstairs.   On spiriva once daily and symbicort 2 each am, some better with rescue saba but only uses it once or twice a week.  No purulent sputum. Some chest/ neck tightness with ex but says exactly the same symptoms prev eval by [facility] in [facility] neg for angina per pt  10/19/10 Follow up and med review  Pt returns for follow up and med review. We reviewed her meds and organized her meds into a med calendar with pt education Says she is doing well.  Last visit. Symbicort increased to 2 puffs Twice daily  . No flare in cough or wheezing. Dyspnea is at baseline.  No hemoptysis.  rec Continue on current regimen.  Follow med calendar closely and bring to each visit.    11/24/2010 f/u ov/Shira Bobst cc breathing no better and using excess saba but no increase cough or purulent secretions or overt HB on present complex regimen which appears to be following per med calendar rec Work on inhaler technique:  GERD diet Only use proair/combent  if can't catch  your breath at rest. Prednisone x 6 day taper off  Try Combivent 2 puffs every 4 hours in place of proaire should work better and last longer    04/15/2011 f/u ov/Roshawna Colclasure  Did not bring med calendar, not finding it useful despite complex rx, cc  breathing has improved since being in pulm rehab. No cough now that off dpi. Still needs HC parking with large events like concerts.   Sleeping ok without nocturnal  or early am exacerbation  of respiratory  c/o's or need for noct saba. Also denies any obvious fluctuation of symptoms with weather or environmental changes or other aggravating or alleviating factors except as outlined above   ROS  At present neg for  any significant sore throat, dysphagia, itching, sneezing,  nasal congestion or excess/ purulent secretions,  fever, chills, sweats, unintended wt loss, pleuritic or exertional cp,  hempoptysis, orthopnea pnd or leg swelling.  Also denies presyncope, palpitations, heartburn, abdominal pain, nausea, vomiting, diarrhea  or change in bowel or urinary habits, dysuria,hematuria,  rash, arthralgias, visual complaints, headache, numbness weakness or ataxia.        Past Medical History:  Depression (12/07/1998)  Hypothyroidism (03/08/1968)  Hypertension (03/08/1992)  Hyperlipidemia (10/04/2000)  Diabetes mellitus, type II (02/05/2006)- Dr. Leslie Dales with endo  COPD (03/08/1998)  - PFT's 12/12/2002 FEV1 1.42 (64%) ratio 58 with no better after B2 and DLCO75%  - PFT's 9/ 15/11 FEV1 1.50 (73%) ratio 50 no better after B2 with DLC0 62%  - hfa 50% November 20, 2009 > 75% January 13, 2010 p coaching >  75% 10/02/2010  VENTRAL HERNIA (ICD-553.20)  ESOPHAGITIS (ICD-530.10)  EDEMA (ICD-782.3)  SOB (ICD-786.05)  ALLERGY (ICD-995.3)  BRONCHITIS, OBSTRUCTIVE CHRONIC W/EXACRB (ICD-491.21)  ROTATOR CUFF SYNDROME, RIGHT (ICD-726.10)  SYNDROME, ROTATOR CUFF NOS (ICD-726.10)  OBESITY NOS (ICD-278.00)  NUMBNESS (ICD-782.0)  POSTMENOPAUSAL ON HORMONE REPLACEMENT THERAPY (ICD-V07.4)  Complex med regimen , med calendar 10/19/10     Family History:  Father dec 75 Emphysema One lung  Mother dec 93 Heart Thyrroid Dz  Brother A 70 Unknown  Brother A 66 Osteo Hyperthyr  Brother A 62 BACK Probs  Sister A 60 Breast Cysts Fibro Back Probs Hypothyr  no esophageal cancer  No known head and neck cancers.  no premature ascvd    Social History:  Reviewed history from 09/16/2009 and no changes required.  married, 2 children, she is retired, former smoker, drinks 2 alcoholic beverages per week, drinks 4 caffeinated beverages per day. Enjoys painting.  Former smoker. Quit on July 9th 2011. She smoked 40 yrs up to 1 ppd           Objective:   Physical Exam  Elderly amb wf NAD  wt 229 September 16, 2009 >  >235 10/19/10 >  11/24/2010  235 > 04/15/2011  234  HEENT mild turbinate edema.  Oropharynx no thrush or excess pnd or cobblestoning. No JVD or cervical adenopathy. Mild accessory muscle hypertrophy. Trachea midline, nl thryroid.  Regular rate and rhythm without murmur gallop or rub or increase P2 or edema. Abd: no hsm, nl excursion. Ext warm without cyanosis or clubbing  Assessment:         Plan:

## 2011-04-15 NOTE — Assessment & Plan Note (Signed)
-   PFT's 12/12/2002 FEV1 1.42 (64%) ratio 58 with no better after B2 and DLCO75%  - PFT's 9/ 15/11 FEV1 1.50 (73%) ratio 50 no better after B2 with DLC0 62%  - hfa 50% November 20, 2009 > 75% January 13, 2010 p coaching >  75% 10/02/2010  >>referred to pulmonary rehab 10/19/2010  - Alpha one AT genotype 04/15/2011 >>>  GOLD II with good control of symptoms on present rx    Each maintenance medication was reviewed in detail including most importantly the difference between maintenance and as needed and under what circumstances the prns are to be used.  Please see instructions for details which were reviewed in writing and the patient given a copy.

## 2011-04-15 NOTE — Progress Notes (Addendum)
Pulmonary Rehabilitation Program Progress Report   Orientation:  12/02/2010 Graduate Date:  04/15/2011 Discharge Date:  04/15/2011 # of sessions completed: 24  Pulmonologist: Wert  Class Time:  1:30pm  A.  Exercise Program:  Walk Test Results:  Pre: 783ft and Post: 892ft, Improved functional capacity  14.92 %, Improved  muscular strength  3.45 %, Improved dyspnea score 14.00 %, Improved education score 1.00 %, Exercise limited by dyspnea, Needs encouragement on exercise program and Discharged to home exercise program.  Anticipated compliance:  fair  B.  Mental Health:  Health related anxiety and Quality of Life (QOL)  improvements:  Overall  82.33 %, Health/Functioning 244.33 %, Socioeconomics 8.22 %, Psych/Spiritual 28.58 %, Family 126.67 %    C.  Education/Instruction/Skills  Attended 10 education classes  Home exercise given: 02/09/2012  D.  Nutrition/Weight Control/Body Composition:  Patient has lost 0.2 kg, Adherence to prescribed nutrition regimen: fair.  Pt very knowledgeable re: diet and what she needs to do to change her eating habits.  *This section completed by Mickle Plumb, Andres Shad, RD, LDN, CDE  E.  Blood Lipids - no recent values available.  F.  Lifestyle Changes:  Making positive lifestyle changes  G.  Symptoms noted with exercise:  Questionable angina, fatigue and Shortness of Breath  Report Completed By:  Ruffin Frederick   Comments:  Patient graduated program completing and achieving all goals. Patient QOL improved drastically over time. Very delightful to work with. A real energy booster for the rest of the class. Vital signs stable throughout program. Patient plans to continue exercise at home and is more than welcome to come to our maintenance program at a later time.

## 2011-04-15 NOTE — Patient Instructions (Signed)
No changes in medications  We will call you with the results of your alpha one testing when they return  Please schedule a follow up visit in 3 months but call sooner if needed

## 2011-04-20 ENCOUNTER — Encounter (HOSPITAL_COMMUNITY)
Admission: RE | Admit: 2011-04-20 | Payer: BC Managed Care – PPO | Source: Ambulatory Visit | Attending: Internal Medicine | Admitting: Internal Medicine

## 2011-04-20 ENCOUNTER — Encounter (HOSPITAL_COMMUNITY): Payer: BC Managed Care – PPO

## 2011-04-22 ENCOUNTER — Encounter (HOSPITAL_COMMUNITY): Payer: BC Managed Care – PPO

## 2011-04-27 ENCOUNTER — Encounter (HOSPITAL_COMMUNITY): Payer: BC Managed Care – PPO

## 2011-04-28 ENCOUNTER — Encounter: Payer: Self-pay | Admitting: Internal Medicine

## 2011-04-29 ENCOUNTER — Encounter (HOSPITAL_COMMUNITY): Payer: BC Managed Care – PPO

## 2011-04-29 ENCOUNTER — Telehealth: Payer: Self-pay | Admitting: Internal Medicine

## 2011-04-29 DIAGNOSIS — E538 Deficiency of other specified B group vitamins: Secondary | ICD-10-CM | POA: Diagnosis not present

## 2011-04-29 DIAGNOSIS — E1142 Type 2 diabetes mellitus with diabetic polyneuropathy: Secondary | ICD-10-CM | POA: Diagnosis not present

## 2011-04-29 DIAGNOSIS — E1149 Type 2 diabetes mellitus with other diabetic neurological complication: Secondary | ICD-10-CM | POA: Diagnosis not present

## 2011-04-29 DIAGNOSIS — E559 Vitamin D deficiency, unspecified: Secondary | ICD-10-CM | POA: Diagnosis not present

## 2011-04-29 DIAGNOSIS — I10 Essential (primary) hypertension: Secondary | ICD-10-CM | POA: Diagnosis not present

## 2011-04-29 DIAGNOSIS — E782 Mixed hyperlipidemia: Secondary | ICD-10-CM | POA: Diagnosis not present

## 2011-04-29 DIAGNOSIS — E039 Hypothyroidism, unspecified: Secondary | ICD-10-CM | POA: Diagnosis not present

## 2011-04-29 NOTE — Telephone Encounter (Signed)
Per MW Alpha 1 neg- Spoke with pt and notified of this and she verbalized understanding and denied any questions.

## 2011-05-04 ENCOUNTER — Encounter: Payer: Self-pay | Admitting: Internal Medicine

## 2011-06-22 ENCOUNTER — Other Ambulatory Visit: Payer: Self-pay | Admitting: Family Medicine

## 2011-07-16 ENCOUNTER — Ambulatory Visit (INDEPENDENT_AMBULATORY_CARE_PROVIDER_SITE_OTHER)
Admission: RE | Admit: 2011-07-16 | Discharge: 2011-07-16 | Disposition: A | Discharge status: Home / Self Care | Payer: BC Managed Care – PPO | Source: Ambulatory Visit | Attending: Adult Health | Admitting: Adult Health

## 2011-07-16 ENCOUNTER — Ambulatory Visit (INDEPENDENT_AMBULATORY_CARE_PROVIDER_SITE_OTHER): Payer: BC Managed Care – PPO | Admitting: Adult Health

## 2011-07-16 ENCOUNTER — Encounter: Payer: Self-pay | Admitting: Adult Health

## 2011-07-16 VITALS — BP 126/76 | HR 85 | Temp 97.9°F | Ht 65.0 in | Wt 226.8 lb

## 2011-07-16 DIAGNOSIS — J449 Chronic obstructive pulmonary disease, unspecified: Secondary | ICD-10-CM | POA: Diagnosis not present

## 2011-07-16 DIAGNOSIS — R059 Cough, unspecified: Secondary | ICD-10-CM | POA: Diagnosis not present

## 2011-07-16 DIAGNOSIS — R05 Cough: Secondary | ICD-10-CM | POA: Diagnosis not present

## 2011-07-16 DIAGNOSIS — R0602 Shortness of breath: Secondary | ICD-10-CM | POA: Diagnosis not present

## 2011-07-16 MED ORDER — MOXIFLOXACIN HCL 400 MG PO TABS: 400.0000 mg | ORAL_TABLET | Freq: Every day | ORAL | Status: AC

## 2011-07-16 MED ORDER — PREDNISONE 10 MG PO TABS: ORAL_TABLET | ORAL | Status: DC

## 2011-07-16 MED ORDER — LEVALBUTEROL HCL 0.63 MG/3ML IN NEBU
0.6300 mg | INHALATION_SOLUTION | Freq: Once | RESPIRATORY_TRACT | Status: AC
  Administered 2011-07-16: 0.63 mg via RESPIRATORY_TRACT

## 2011-07-16 NOTE — Progress Notes (Signed)
Subjective:     Patient ID: Alexandria [name], female   DOB: 04-06-1935    MRN: [id number]   Brief patient profile:  75  yowf quit smoking Dec 2006 with GOLD II COPD FEV1 1.42 (64%) in 2004   HPI September 16, 2009 1st pulmonary office eval in [place]  cc COPD followup. Pt last seen in May 2006. She c/o worsening SOB for the past several yrs. She gets out of breath walking up stairs and walking flat surface approx 500 ft. She states that hot/humid weather makes breathing worse. supposed to be taking spiriva and advair but not consistent. better after proaire though hfa technique margnial. Notes mild reflux/ dysphagia/ hoarseness also rec .stop advair  Symbicort 2 puffs first thing in am and 2 puffs again in pm about 12 hours later  Take spiriva after symbicort  if needed proaire 2 puffs every 4 hours with goal of needing it less than twice daily  Nexium Take one 30-60 min before first meal of the day   November 20, 2009 6 wk followup with PFT's. Pt states that her breathing has improved since last seen. Still gets SOB walking up stairs. She c/o "wheezing in throat". using proaire once a week but also waking up once a week but no need for night time proaire. rec  Work on inhaler technique:  try off spiriva  Return to office in 3 months, sooner if needed  If you continue to waken at night with breathing problems try taking pepcid ac 20 mg one at bedtime to see if this helps > did not restart spirva though worse off it, did not start pepcid despite worse breathing and night symptoms    January 13, 2010 Increased SOB x 3 wks. Also c/o chest tightness. did not restart spiriva did not take pepcid just loosing lots of tums. chest tightness variably responds to proaire, feels like a band around her chest bilaterally not localized, no assoc nausea, sleeping poorly x in recliner. better on relative's cpap machine. very stressed out. no purulent sputum.  10/02/2010 ov/Wert cc sob/hoarseness  x 6 months gradually  worse to point where having trouble getting into store. Sleep fine on cpap, feel rested in am but sleeping in chair downstairs.   On spiriva once daily and symbicort 2 each am, some better with rescue saba but only uses it once or twice a week.  No purulent sputum. Some chest/ neck tightness with ex but says exactly the same symptoms prev eval by [facility] in [facility] neg for angina per pt  10/19/10 Follow up and med review  Pt returns for follow up and med review. We reviewed her meds and organized her meds into a med calendar with pt education Says she is doing well.  Last visit. Symbicort increased to 2 puffs Twice daily  . No flare in cough or wheezing. Dyspnea is at baseline.  No hemoptysis.  rec Continue on current regimen.  Follow med calendar closely and bring to each visit.    11/24/2010 f/u ov/Wert cc breathing no better and using excess saba but no increase cough or purulent secretions or overt HB on present complex regimen which appears to be following per med calendar rec Work on inhaler technique:  GERD diet Only use proair/combent  if can't catch  your breath at rest. Prednisone x 6 day taper off  Try Combivent 2 puffs every 4 hours in place of proaire should work better and last longer    04/15/2011 f/u ov/Wert  Did not bring med calendar, not finding it useful despite complex rx, cc  breathing has improved since being in pulm rehab. No cough now that off dpi. Still needs HC parking with large events like concerts.   Sleeping ok without nocturnal  or early am exacerbation  of respiratory  c/o's or need for noct saba. Also denies any obvious fluctuation of symptoms with weather or environmental changes or other aggravating or alleviating factors except as outlined above   ROS  At present neg for  any significant sore throat, dysphagia, itching, sneezing,  nasal congestion or excess/ purulent secretions,  fever, chills, sweats, unintended wt loss, pleuritic or exertional cp,  hempoptysis, orthopnea pnd or leg swelling.  Also denies presyncope, palpitations, heartburn, abdominal pain, nausea, vomiting, diarrhea  or change in bowel or urinary habits, dysuria,hematuria,  rash, arthralgias, visual complaints, headache, numbness weakness or ataxia.        Past Medical History:  Depression (12/07/1998)  Hypothyroidism (03/08/1968)  Hypertension (03/08/1992)  Hyperlipidemia (10/04/2000)  Diabetes mellitus, type II (02/05/2006)- Dr. Leslie Dales with endo  COPD (03/08/1998)  - PFT's 12/12/2002 FEV1 1.42 (64%) ratio 58 with no better after B2 and DLCO75%  - PFT's 9/ 15/11 FEV1 1.50 (73%) ratio 50 no better after B2 with DLC0 62%  - hfa 50% November 20, 2009 > 75% January 13, 2010 p coaching >  75% 10/02/2010  VENTRAL HERNIA (ICD-553.20)  ESOPHAGITIS (ICD-530.10)  EDEMA (ICD-782.3)  SOB (ICD-786.05)  ALLERGY (ICD-995.3)  BRONCHITIS, OBSTRUCTIVE CHRONIC W/EXACRB (ICD-491.21)  ROTATOR CUFF SYNDROME, RIGHT (ICD-726.10)  SYNDROME, ROTATOR CUFF NOS (ICD-726.10)  OBESITY NOS (ICD-278.00)  NUMBNESS (ICD-782.0)  POSTMENOPAUSAL ON HORMONE REPLACEMENT THERAPY (ICD-V07.4)  Complex med regimen , med calendar 10/19/10     Family History:  Father dec 75 Emphysema One lung  Mother dec 93 Heart Thyrroid Dz  Brother A 2 Unknown  Brother A 66 Osteo Hyperthyr  Brother A 62 BACK Probs  Sister A 60 Breast Cysts Fibro Back Probs Hypothyr  no esophageal cancer  No known head and neck cancers.  no premature ascvd    Social History:  Reviewed history from 09/16/2009 and no changes required.  married, 2 children, she is retired, former smoker, drinks 2 alcoholic beverages per week, drinks 4 caffeinated beverages per day. Enjoys painting.  Former smoker. Quit on July 9th 2011. She smoked 40 yrs up to 1 ppd           Objective:   Physical Exam  Elderly amb wf NAD  wt 229 September 16, 2009 >  >235 10/19/10 >  11/24/2010  235 > 04/15/2011  234 >07/16/2011 226  GEN: A/Ox3; pleasant  , NAD, obese  HEENT:  /AT,  EACs-clear, TMs-wnl, NOSE-clear, THROAT-clear, no lesions, no postnasal drip or exudate noted.   NECK:  Supple w/ fair ROM; no JVD; normal carotid impulses w/o bruits; no thyromegaly or nodules palpated; no lymphadenopathy.  RESP  Coarse BS w/ exp wheezing w/o, wheezes/ rales/ or rhonchi.no accessory muscle use, no dullness to percussion  CARD:  RRR, no m/r/g  , no peripheral edema, pulses intact, no cyanosis or clubbing.  GI:   Soft & nt; nml bowel sounds; no organomegaly or masses detected.  Musco: Warm bil, no deformities or joint swelling noted.   Neuro: alert, no focal deficits noted.    Skin: Warm, no lesions or rashes    Assessment:         Plan:

## 2011-07-16 NOTE — Patient Instructions (Signed)
Avelox 400mg  daily for 7 days  Mucinex DM Twice daily As needed  Cough/congestion  Prednisone taper over next week.  Fluids and rest  I will call with xray results.  Please contact office for sooner follow up if symptoms do not improve or worsen or seek emergency care  Follow up Dr. Sherene Sires  In 1 month and As needed

## 2011-07-16 NOTE — Assessment & Plan Note (Addendum)
Exacerbation  Albuterol neb x 1  CXR pending   Plan:  Avelox 400mg  daily for 7 days  Mucinex DM Twice daily As needed  Cough/congestion  Prednisone taper over next week.  Fluids and rest  I will call with xray results.  Please contact office for sooner follow up if symptoms do not improve or worsen or seek emergency care  Follow up Dr. Sherene Sires  In 1 month and As needed

## 2011-07-16 NOTE — Progress Notes (Signed)
Addended by: Boone Master E on: 07/16/2011 05:24 PM   Modules accepted: Orders

## 2011-07-21 ENCOUNTER — Encounter: Payer: Self-pay | Admitting: Family Medicine

## 2011-07-23 ENCOUNTER — Encounter: Payer: Self-pay | Admitting: Family Medicine

## 2011-07-25 ENCOUNTER — Encounter: Payer: Self-pay | Admitting: Family Medicine

## 2011-07-25 DIAGNOSIS — M8580 Other specified disorders of bone density and structure, unspecified site: Secondary | ICD-10-CM | POA: Insufficient documentation

## 2011-07-26 ENCOUNTER — Encounter: Payer: Self-pay | Admitting: *Deleted

## 2011-08-25 ENCOUNTER — Ambulatory Visit: Payer: BC Managed Care – PPO | Admitting: Internal Medicine

## 2011-09-23 ENCOUNTER — Other Ambulatory Visit: Payer: Self-pay

## 2011-09-23 DIAGNOSIS — F419 Anxiety disorder, unspecified: Secondary | ICD-10-CM | POA: Insufficient documentation

## 2011-09-23 MED ORDER — ALPRAZOLAM 0.5 MG PO TABS: 0.2500 mg | ORAL_TABLET | Freq: Two times a day (BID) | ORAL | Status: DC

## 2011-09-23 NOTE — Telephone Encounter (Signed)
Pt request 90 day supply refill for Alprazolam 0.[facility] mg [facility].Please advise.

## 2011-09-23 NOTE — Telephone Encounter (Signed)
Please fax in after I sign. Thanks.  

## 2011-09-24 NOTE — Telephone Encounter (Signed)
Faxed to [facility].

## 2011-10-13 NOTE — Telephone Encounter (Signed)
Pt [facility] not [facility] and wanted to make sure we sent refill. I explained refill sent on 09/24/11. Pt will ck with [facility] and have pharmacy call back if problem.

## 2011-10-15 NOTE — Telephone Encounter (Signed)
Pt did not receive alprazolam; pt said [facility] advised our office to call [phone] option 3 request faxed form. I spoke with Estanislado Pandy and finally Redmond Pulling who took refill over the phone. Was told since more than 5 months since med filled could not fax form to our office. Pt notified med was called [facility] and should receive in 8-10 days.

## 2011-11-02 ENCOUNTER — Other Ambulatory Visit: Payer: Self-pay | Admitting: Family Medicine

## 2011-11-02 NOTE — Telephone Encounter (Signed)
Electronic refill request.  No upcoming appt scheduled.  Please advise.

## 2011-11-03 NOTE — Telephone Encounter (Signed)
Patient notified as instructed by telephone. Was advised by patient that she is getting ready to go out of town and will call back next week and get a 30 minute appointment scheduled.

## 2011-11-03 NOTE — Telephone Encounter (Signed)
Sent, needs 30min OV.  

## 2011-11-26 ENCOUNTER — Other Ambulatory Visit: Payer: Self-pay | Admitting: Family Medicine

## 2011-11-26 DIAGNOSIS — M542 Cervicalgia: Secondary | ICD-10-CM | POA: Diagnosis not present

## 2011-11-26 DIAGNOSIS — M25519 Pain in unspecified shoulder: Secondary | ICD-10-CM | POA: Diagnosis not present

## 2011-11-26 NOTE — Telephone Encounter (Signed)
Electronic refill request.  Patient has not been seen in quite some time and no pending appts.  Please advise.

## 2011-11-26 NOTE — Telephone Encounter (Signed)
LMOVM

## 2011-11-26 NOTE — Telephone Encounter (Signed)
Sent.  Please get a physical set up with labs ahead of time.  Thanks.

## 2011-12-02 DIAGNOSIS — H01009 Unspecified blepharitis unspecified eye, unspecified eyelid: Secondary | ICD-10-CM | POA: Diagnosis not present

## 2011-12-07 DIAGNOSIS — M67919 Unspecified disorder of synovium and tendon, unspecified shoulder: Secondary | ICD-10-CM | POA: Diagnosis not present

## 2011-12-08 ENCOUNTER — Other Ambulatory Visit: Payer: Self-pay | Admitting: Orthopedic Surgery

## 2011-12-08 DIAGNOSIS — M25512 Pain in left shoulder: Secondary | ICD-10-CM

## 2011-12-09 DIAGNOSIS — E538 Deficiency of other specified B group vitamins: Secondary | ICD-10-CM | POA: Diagnosis not present

## 2011-12-09 DIAGNOSIS — E039 Hypothyroidism, unspecified: Secondary | ICD-10-CM | POA: Diagnosis not present

## 2011-12-09 DIAGNOSIS — E782 Mixed hyperlipidemia: Secondary | ICD-10-CM | POA: Diagnosis not present

## 2011-12-09 DIAGNOSIS — E1149 Type 2 diabetes mellitus with other diabetic neurological complication: Secondary | ICD-10-CM | POA: Diagnosis not present

## 2011-12-09 DIAGNOSIS — E559 Vitamin D deficiency, unspecified: Secondary | ICD-10-CM | POA: Diagnosis not present

## 2011-12-14 DIAGNOSIS — I10 Essential (primary) hypertension: Secondary | ICD-10-CM | POA: Diagnosis not present

## 2011-12-14 DIAGNOSIS — E1142 Type 2 diabetes mellitus with diabetic polyneuropathy: Secondary | ICD-10-CM | POA: Diagnosis not present

## 2011-12-14 DIAGNOSIS — E1149 Type 2 diabetes mellitus with other diabetic neurological complication: Secondary | ICD-10-CM | POA: Diagnosis not present

## 2011-12-14 DIAGNOSIS — E039 Hypothyroidism, unspecified: Secondary | ICD-10-CM | POA: Diagnosis not present

## 2011-12-14 DIAGNOSIS — E782 Mixed hyperlipidemia: Secondary | ICD-10-CM | POA: Diagnosis not present

## 2011-12-14 DIAGNOSIS — E538 Deficiency of other specified B group vitamins: Secondary | ICD-10-CM | POA: Diagnosis not present

## 2011-12-14 DIAGNOSIS — E559 Vitamin D deficiency, unspecified: Secondary | ICD-10-CM | POA: Diagnosis not present

## 2011-12-15 ENCOUNTER — Ambulatory Visit
Admission: RE | Admit: 2011-12-15 | Discharge: 2011-12-15 | Disposition: A | Discharge status: Home / Self Care | Payer: BC Managed Care – PPO | Source: Ambulatory Visit | Attending: Orthopedic Surgery | Admitting: Orthopedic Surgery

## 2011-12-15 ENCOUNTER — Other Ambulatory Visit: Payer: BC Managed Care – PPO

## 2011-12-15 DIAGNOSIS — M25512 Pain in left shoulder: Secondary | ICD-10-CM

## 2011-12-15 DIAGNOSIS — M19019 Primary osteoarthritis, unspecified shoulder: Secondary | ICD-10-CM | POA: Diagnosis not present

## 2011-12-16 ENCOUNTER — Ambulatory Visit (INDEPENDENT_AMBULATORY_CARE_PROVIDER_SITE_OTHER): Payer: BC Managed Care – PPO | Admitting: Family Medicine

## 2011-12-16 ENCOUNTER — Encounter: Payer: Self-pay | Admitting: Family Medicine

## 2011-12-16 VITALS — BP 130/74 | HR 86 | Temp 98.0°F | Wt 230.8 lb

## 2011-12-16 DIAGNOSIS — E039 Hypothyroidism, unspecified: Secondary | ICD-10-CM

## 2011-12-16 DIAGNOSIS — F329 Major depressive disorder, single episode, unspecified: Secondary | ICD-10-CM | POA: Diagnosis not present

## 2011-12-16 DIAGNOSIS — M25519 Pain in unspecified shoulder: Secondary | ICD-10-CM

## 2011-12-16 DIAGNOSIS — Z23 Encounter for immunization: Secondary | ICD-10-CM | POA: Diagnosis not present

## 2011-12-16 DIAGNOSIS — K469 Unspecified abdominal hernia without obstruction or gangrene: Secondary | ICD-10-CM

## 2011-12-16 DIAGNOSIS — F419 Anxiety disorder, unspecified: Secondary | ICD-10-CM

## 2011-12-16 DIAGNOSIS — F411 Generalized anxiety disorder: Secondary | ICD-10-CM | POA: Diagnosis not present

## 2011-12-16 DIAGNOSIS — J449 Chronic obstructive pulmonary disease, unspecified: Secondary | ICD-10-CM | POA: Diagnosis not present

## 2011-12-16 DIAGNOSIS — F3289 Other specified depressive episodes: Secondary | ICD-10-CM

## 2011-12-16 DIAGNOSIS — E1149 Type 2 diabetes mellitus with other diabetic neurological complication: Secondary | ICD-10-CM

## 2011-12-16 MED ORDER — FLUOXETINE HCL 10 MG PO CAPS: ORAL_CAPSULE | ORAL | Status: DC

## 2011-12-16 NOTE — Patient Instructions (Addendum)
Increase the prozac to 40mg  a day and notify me if that doesn't help.  I would highly recommend counseling.  Take care.  I'll await the ortho opinion on your shoulder.

## 2011-12-17 ENCOUNTER — Other Ambulatory Visit: Payer: BC Managed Care – PPO

## 2011-12-17 ENCOUNTER — Encounter: Payer: Self-pay | Admitting: Family Medicine

## 2011-12-17 DIAGNOSIS — M25519 Pain in unspecified shoulder: Secondary | ICD-10-CM | POA: Insufficient documentation

## 2011-12-17 DIAGNOSIS — K469 Unspecified abdominal hernia without obstruction or gangrene: Secondary | ICD-10-CM | POA: Insufficient documentation

## 2011-12-17 NOTE — Assessment & Plan Note (Signed)
Per endo °

## 2011-12-17 NOTE — Assessment & Plan Note (Addendum)
Would inc SSRI.  She would be a great candidate for counseling but she adamantly refuses.  "I don't want to talk to anyone."  No Si/Hi.  She doesn't have medical problems with easy "fixes" and her home situation is stressful.  I have asked her to consider counseling and notify me if mood isn't improved.  She agrees.  >40 min spent with face to face with patient, >50% counseling and/or coordinating care

## 2011-12-17 NOTE — Assessment & Plan Note (Signed)
Per pulm 

## 2011-12-17 NOTE — Assessment & Plan Note (Signed)
Per ortho.  

## 2011-12-17 NOTE — Assessment & Plan Note (Signed)
I don't appreciate focal mass. Will follow clinically.

## 2011-12-17 NOTE — Progress Notes (Signed)
Pt presents with the following:  Has seen endo recently, awaiting records.  She reports A1c controlled for DM2.  Complaint with meds for lipids and thyroid.  I will review labs/notes when they arrive.    She has continued shoulder pain and is awaiting report from ortho.  I read the MR report but not the films for patient.  She does have a tear- as suspected by patient- but I will await ortho input.  Pt agrees with that.   She reports a variable size abd mass ever since she had hernia surgery.  Not painful.  She wanted eval.    She has stopped smoking but still has exertional SOB in spite of medical tx per pulmonary.  She has gone through pulm rehab prev.   She is tearful discussing her life and marriage.  Her activity is limited by SOB, shoulder pain, weight.  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  No SI/HI.  She describes him as being 'short' with her.  She doesn't give history of physical danger.  She is overwhelmed, tearful, 'but I try to not to let people know, I stay positive.'  PMH and SH reviewed  ROS: See HPI, otherwise noncontributory.  Meds, vitals, and allergies reviewed.   Nad, flat affect, tearful, then regains composure.  ncat Mmm Neck supple rrr ctab w/o wheeze but overall dec in BS globally. abd soft, obese, I can't appreciate a focal mass in the pannus Ext w/o cyanosis

## 2011-12-17 NOTE — Assessment & Plan Note (Signed)
See above

## 2011-12-31 DIAGNOSIS — S43429A Sprain of unspecified rotator cuff capsule, initial encounter: Secondary | ICD-10-CM | POA: Diagnosis not present

## 2012-01-04 ENCOUNTER — Other Ambulatory Visit: Payer: Self-pay | Admitting: Orthopedic Surgery

## 2012-01-06 ENCOUNTER — Other Ambulatory Visit: Payer: Self-pay | Admitting: Orthopedic Surgery

## 2012-01-18 ENCOUNTER — Other Ambulatory Visit: Payer: Self-pay | Admitting: Family Medicine

## 2012-01-20 DIAGNOSIS — H2510 Age-related nuclear cataract, unspecified eye: Secondary | ICD-10-CM | POA: Diagnosis not present

## 2012-01-20 DIAGNOSIS — H52209 Unspecified astigmatism, unspecified eye: Secondary | ICD-10-CM | POA: Diagnosis not present

## 2012-01-20 DIAGNOSIS — H40019 Open angle with borderline findings, low risk, unspecified eye: Secondary | ICD-10-CM | POA: Diagnosis not present

## 2012-01-20 DIAGNOSIS — H02839 Dermatochalasis of unspecified eye, unspecified eyelid: Secondary | ICD-10-CM | POA: Diagnosis not present

## 2012-01-27 ENCOUNTER — Encounter (HOSPITAL_COMMUNITY): Payer: Self-pay | Admitting: Pharmacy Technician

## 2012-01-28 NOTE — Patient Instructions (Addendum)
20 DELIANA FROGGE  01/28/2012   Your procedure is scheduled on: 02/08/12  Report to [facility] at 10:00 AM.  Call this number if you have problems the morning of surgery [phone]: [phone]   Remember: Bring CPAP mask and tubing   Do not eat food or drink liquids After Midnight.     Take these medicines the morning of surgery with A SIP OF WATER: xanax, fluoxetine, flonase nasal spray, nexium, symbicort, synthroid   Do not wear jewelry, make-up or nail polish.  Do not wear lotions, powders, or perfumes. You may wear deodorant.  Do not shave 48 hours prior to surgery. Men may shave face and neck.  Do not bring valuables to the hospital.  Contacts, dentures or bridgework may not be worn into surgery.  Leave suitcase in the car. After surgery it may be brought to your room.  For patients admitted to the hospital, checkout time is 11:00 AM the day of discharge.    Special Instructions: Shower using CHG 2 nights before surgery and the night before surgery.  If you shower the day of surgery use CHG.  Use special wash - you have one bottle of CHG for all showers.  You should use approximately 1/3 of the bottle for each shower.   Please read over the following fact sheets that you were given: MRSA Information.  Birdie Sons, RN  pre op nurse call if needed [phone]

## 2012-01-31 ENCOUNTER — Other Ambulatory Visit: Payer: Self-pay | Admitting: Family Medicine

## 2012-01-31 ENCOUNTER — Other Ambulatory Visit: Payer: Self-pay

## 2012-01-31 ENCOUNTER — Encounter (HOSPITAL_COMMUNITY)
Admission: RE | Admit: 2012-01-31 | Discharge: 2012-01-31 | Disposition: A | Discharge status: Home / Self Care | Payer: BC Managed Care – PPO | Source: Ambulatory Visit | Attending: Orthopedic Surgery | Admitting: Orthopedic Surgery

## 2012-01-31 ENCOUNTER — Encounter (HOSPITAL_COMMUNITY): Payer: Self-pay

## 2012-01-31 ENCOUNTER — Other Ambulatory Visit (HOSPITAL_COMMUNITY): Payer: Self-pay | Admitting: Orthopedic Surgery

## 2012-01-31 HISTORY — DX: Gastro-esophageal reflux disease without esophagitis: K21.9

## 2012-01-31 LAB — BASIC METABOLIC PANEL
BUN: 21 mg/dL (ref 6–23)
CO2: 28 mEq/L (ref 19–32)
Calcium: 9.6 mg/dL (ref 8.4–10.5)
Chloride: 104 mEq/L (ref 96–112)
Creatinine, Ser: 0.84 mg/dL (ref 0.50–1.10)
GFR calc Af Amer: 77 mL/min — ABNORMAL LOW (ref >90)
GFR calc non Af Amer: 67 mL/min — ABNORMAL LOW (ref >90)
Glucose, Bld: 110 mg/dL — ABNORMAL HIGH (ref 70–99)
Potassium: 4.5 mEq/L (ref 3.5–5.1)
Sodium: 140 mEq/L (ref 135–145)

## 2012-01-31 LAB — SURGICAL PCR SCREEN
MRSA, PCR: NEGATIVE
Staphylococcus aureus: NEGATIVE

## 2012-01-31 LAB — CBC
HCT: 35.8 % — ABNORMAL LOW (ref 36.0–46.0)
Hemoglobin: 11.3 g/dL — ABNORMAL LOW (ref 12.0–15.0)
MCH: 26.5 pg (ref 26.0–34.0)
MCHC: 31.6 g/dL (ref 30.0–36.0)
MCV: 83.8 fL (ref 78.0–100.0)
Platelets: 269 10*3/uL (ref 150–400)
RBC: 4.27 MIL/uL (ref 3.87–5.11)
RDW: 15.5 % (ref 11.5–15.5)
WBC: 6.9 10*3/uL (ref 4.0–10.5)

## 2012-02-07 MED ORDER — CEFAZOLIN SODIUM-DEXTROSE 2-3 GM-% IV SOLR
2.0000 g | INTRAVENOUS | Status: AC
  Administered 2012-02-08: 2 g via INTRAVENOUS

## 2012-02-08 ENCOUNTER — Encounter (HOSPITAL_COMMUNITY): Payer: Self-pay | Admitting: Anesthesiology

## 2012-02-08 ENCOUNTER — Encounter (HOSPITAL_COMMUNITY)
Admission: RE | Disposition: A | Discharge status: Home / Self Care | Payer: Self-pay | Source: Ambulatory Visit | Attending: Orthopedic Surgery

## 2012-02-08 ENCOUNTER — Encounter (HOSPITAL_COMMUNITY): Payer: Self-pay | Admitting: *Deleted

## 2012-02-08 ENCOUNTER — Ambulatory Visit (HOSPITAL_COMMUNITY): Payer: BC Managed Care – PPO | Admitting: Anesthesiology

## 2012-02-08 ENCOUNTER — Observation Stay (HOSPITAL_COMMUNITY)
Admission: RE | Admit: 2012-02-08 | Discharge: 2012-02-09 | Disposition: A | Discharge status: Home / Self Care | Payer: BC Managed Care – PPO | Source: Ambulatory Visit | Attending: Orthopedic Surgery | Admitting: Orthopedic Surgery

## 2012-02-08 DIAGNOSIS — M19019 Primary osteoarthritis, unspecified shoulder: Secondary | ICD-10-CM | POA: Diagnosis not present

## 2012-02-08 DIAGNOSIS — Z01812 Encounter for preprocedural laboratory examination: Secondary | ICD-10-CM | POA: Insufficient documentation

## 2012-02-08 DIAGNOSIS — M66329 Spontaneous rupture of flexor tendons, unspecified upper arm: Secondary | ICD-10-CM | POA: Diagnosis not present

## 2012-02-08 DIAGNOSIS — M719 Bursopathy, unspecified: Principal | ICD-10-CM | POA: Insufficient documentation

## 2012-02-08 DIAGNOSIS — G8918 Other acute postprocedural pain: Secondary | ICD-10-CM | POA: Diagnosis not present

## 2012-02-08 DIAGNOSIS — M75122 Complete rotator cuff tear or rupture of left shoulder, not specified as traumatic: Secondary | ICD-10-CM

## 2012-02-08 DIAGNOSIS — M75110 Incomplete rotator cuff tear or rupture of unspecified shoulder, not specified as traumatic: Secondary | ICD-10-CM | POA: Diagnosis not present

## 2012-02-08 DIAGNOSIS — Z01810 Encounter for preprocedural cardiovascular examination: Secondary | ICD-10-CM | POA: Insufficient documentation

## 2012-02-08 DIAGNOSIS — M75120 Complete rotator cuff tear or rupture of unspecified shoulder, not specified as traumatic: Secondary | ICD-10-CM | POA: Diagnosis not present

## 2012-02-08 DIAGNOSIS — M67919 Unspecified disorder of synovium and tendon, unspecified shoulder: Principal | ICD-10-CM | POA: Insufficient documentation

## 2012-02-08 HISTORY — PX: SHOULDER OPEN ROTATOR CUFF REPAIR: SHX2407

## 2012-02-08 LAB — GLUCOSE, CAPILLARY
Glucose-Capillary: 117 mg/dL — ABNORMAL HIGH (ref 70–99)
Glucose-Capillary: 120 mg/dL — ABNORMAL HIGH (ref 70–99)

## 2012-02-08 SURGERY — REPAIR, ROTATOR CUFF, OPEN
Anesthesia: General | Site: Shoulder | Laterality: Left | Wound class: Clean

## 2012-02-08 MED ORDER — ACETAMINOPHEN 10 MG/ML IV SOLN
1000.0000 mg | Freq: Four times a day (QID) | INTRAVENOUS | Status: DC
  Administered 2012-02-08 – 2012-02-09 (×3): 1000 mg via INTRAVENOUS
  Filled 2012-02-08 (×5): qty 100

## 2012-02-08 MED ORDER — METHOCARBAMOL 100 MG/ML IJ SOLN
500.0000 mg | Freq: Four times a day (QID) | INTRAVENOUS | Status: DC | PRN
  Administered 2012-02-08: 500 mg via INTRAVENOUS
  Filled 2012-02-08 (×2): qty 5

## 2012-02-08 MED ORDER — ROPIVACAINE HCL 5 MG/ML IJ SOLN
INTRAMUSCULAR | Status: DC | PRN
  Administered 2012-02-08: 20 mL

## 2012-02-08 MED ORDER — LACTATED RINGERS IV SOLN
INTRAVENOUS | Status: DC
  Administered 2012-02-08 (×2): 1000 mL via INTRAVENOUS

## 2012-02-08 MED ORDER — METHOCARBAMOL 500 MG PO TABS
500.0000 mg | ORAL_TABLET | Freq: Four times a day (QID) | ORAL | Status: DC | PRN
  Administered 2012-02-09: 500 mg via ORAL
  Filled 2012-02-08 (×2): qty 1

## 2012-02-08 MED ORDER — ASPIRIN EC 81 MG PO TBEC
81.0000 mg | DELAYED_RELEASE_TABLET | Freq: Every day | ORAL | Status: DC
  Administered 2012-02-08 – 2012-02-09 (×2): 81 mg via ORAL
  Filled 2012-02-08 (×2): qty 1

## 2012-02-08 MED ORDER — ACETAMINOPHEN 10 MG/ML IV SOLN
INTRAVENOUS | Status: AC
  Administered 2012-02-08: 1000 mg
  Filled 2012-02-08: qty 100

## 2012-02-08 MED ORDER — FLUTICASONE PROPIONATE 50 MCG/ACT NA SUSP
2.0000 | Freq: Two times a day (BID) | NASAL | Status: DC
  Administered 2012-02-09: 2 via NASAL
  Filled 2012-02-08: qty 16

## 2012-02-08 MED ORDER — PANTOPRAZOLE SODIUM 40 MG PO TBEC
80.0000 mg | DELAYED_RELEASE_TABLET | Freq: Every morning | ORAL | Status: DC
  Filled 2012-02-08: qty 2

## 2012-02-08 MED ORDER — METOCLOPRAMIDE HCL 10 MG PO TABS: 5.0000 mg | ORAL_TABLET | Freq: Three times a day (TID) | ORAL | Status: DC | PRN

## 2012-02-08 MED ORDER — DEXTROSE-NACL 5-0.45 % IV SOLN
INTRAVENOUS | Status: DC
  Administered 2012-02-08: 16:00:00 via INTRAVENOUS

## 2012-02-08 MED ORDER — ONDANSETRON HCL 4 MG/2ML IJ SOLN: 4.0000 mg | Freq: Four times a day (QID) | INTRAMUSCULAR | Status: DC | PRN

## 2012-02-08 MED ORDER — KETOROLAC TROMETHAMINE 30 MG/ML IJ SOLN: 15.0000 mg | Freq: Once | INTRAMUSCULAR | Status: DC | PRN

## 2012-02-08 MED ORDER — ONDANSETRON HCL 4 MG/2ML IJ SOLN
INTRAMUSCULAR | Status: DC | PRN
  Administered 2012-02-08: 4 mg via INTRAVENOUS

## 2012-02-08 MED ORDER — FLUOXETINE HCL 10 MG PO CAPS
10.0000 mg | ORAL_CAPSULE | Freq: Two times a day (BID) | ORAL | Status: DC
  Administered 2012-02-08 – 2012-02-09 (×2): 10 mg via ORAL
  Filled 2012-02-08 (×3): qty 1

## 2012-02-08 MED ORDER — EZETIMIBE 10 MG PO TABS
10.0000 mg | ORAL_TABLET | Freq: Every day | ORAL | Status: DC
  Administered 2012-02-08: 10 mg via ORAL
  Filled 2012-02-08 (×2): qty 1

## 2012-02-08 MED ORDER — METOCLOPRAMIDE HCL 5 MG/ML IJ SOLN: 5.0000 mg | Freq: Three times a day (TID) | INTRAMUSCULAR | Status: DC | PRN

## 2012-02-08 MED ORDER — PROPOFOL 10 MG/ML IV BOLUS
INTRAVENOUS | Status: DC | PRN
  Administered 2012-02-08: 150 mg via INTRAVENOUS
  Administered 2012-02-08: 50 mg via INTRAVENOUS

## 2012-02-08 MED ORDER — BUDESONIDE-FORMOTEROL FUMARATE 160-4.5 MCG/ACT IN AERO
2.0000 | INHALATION_SPRAY | Freq: Two times a day (BID) | RESPIRATORY_TRACT | Status: DC
  Administered 2012-02-09: 2 via RESPIRATORY_TRACT
  Filled 2012-02-08: qty 6

## 2012-02-08 MED ORDER — FENTANYL CITRATE 0.05 MG/ML IJ SOLN
INTRAMUSCULAR | Status: AC
  Filled 2012-02-08: qty 2

## 2012-02-08 MED ORDER — HYDROMORPHONE HCL PF 1 MG/ML IJ SOLN: 0.2500 mg | INTRAMUSCULAR | Status: DC | PRN

## 2012-02-08 MED ORDER — FAMOTIDINE 20 MG PO TABS
20.0000 mg | ORAL_TABLET | Freq: Every day | ORAL | Status: DC
  Administered 2012-02-08: 20 mg via ORAL
  Filled 2012-02-08 (×2): qty 1

## 2012-02-08 MED ORDER — MEPERIDINE HCL 50 MG PO TABS
50.0000 mg | ORAL_TABLET | ORAL | Status: DC | PRN
  Administered 2012-02-08: 50 mg via ORAL
  Filled 2012-02-08: qty 1

## 2012-02-08 MED ORDER — EPHEDRINE SULFATE 50 MG/ML IJ SOLN
INTRAMUSCULAR | Status: DC | PRN
  Administered 2012-02-08: 10 mg via INTRAVENOUS

## 2012-02-08 MED ORDER — PROMETHAZINE HCL 25 MG/ML IJ SOLN: 6.2500 mg | INTRAMUSCULAR | Status: DC | PRN

## 2012-02-08 MED ORDER — EPINEPHRINE 0.3 MG/0.3ML IJ DEVI: 0.3000 mg | INTRAMUSCULAR | Status: DC

## 2012-02-08 MED ORDER — LEVOTHYROXINE SODIUM 150 MCG PO TABS
150.0000 ug | ORAL_TABLET | Freq: Every day | ORAL | Status: DC
  Administered 2012-02-09: 150 ug via ORAL
  Filled 2012-02-08 (×2): qty 1

## 2012-02-08 MED ORDER — ONDANSETRON HCL 4 MG PO TABS: 4.0000 mg | ORAL_TABLET | Freq: Four times a day (QID) | ORAL | Status: DC | PRN

## 2012-02-08 MED ORDER — ROCURONIUM BROMIDE 100 MG/10ML IV SOLN
INTRAVENOUS | Status: DC | PRN
  Administered 2012-02-08: 2 mg via INTRAVENOUS

## 2012-02-08 MED ORDER — MIDAZOLAM HCL 2 MG/2ML IJ SOLN
INTRAMUSCULAR | Status: AC
  Filled 2012-02-08: qty 2

## 2012-02-08 MED ORDER — MIDAZOLAM HCL 10 MG/2ML IJ SOLN
1.0000 mg | INTRAMUSCULAR | Status: DC | PRN
  Administered 2012-02-08: 2 mg via INTRAVENOUS

## 2012-02-08 MED ORDER — LIDOCAINE HCL (CARDIAC) 20 MG/ML IV SOLN
INTRAVENOUS | Status: DC | PRN
  Administered 2012-02-08: 50 mg via INTRAVENOUS

## 2012-02-08 MED ORDER — 0.9 % SODIUM CHLORIDE (POUR BTL) OPTIME
TOPICAL | Status: DC | PRN
  Administered 2012-02-08: 1000 mL

## 2012-02-08 MED ORDER — CEFAZOLIN SODIUM-DEXTROSE 2-3 GM-% IV SOLR
INTRAVENOUS | Status: AC
  Filled 2012-02-08: qty 50

## 2012-02-08 MED ORDER — BUPIVACAINE-EPINEPHRINE 0.5% -1:200000 IJ SOLN
INTRAMUSCULAR | Status: AC
  Filled 2012-02-08: qty 1

## 2012-02-08 MED ORDER — NITROGLYCERIN 0.4 MG SL SUBL: 0.4000 mg | SUBLINGUAL_TABLET | SUBLINGUAL | Status: DC | PRN

## 2012-02-08 MED ORDER — FENTANYL CITRATE 0.05 MG/ML IJ SOLN
50.0000 ug | INTRAMUSCULAR | Status: DC | PRN
  Administered 2012-02-08: 100 ug via INTRAVENOUS

## 2012-02-08 MED ORDER — ROPIVACAINE HCL 5 MG/ML IJ SOLN
INTRAMUSCULAR | Status: AC
  Filled 2012-02-08: qty 30

## 2012-02-08 MED ORDER — PHENYLEPHRINE HCL 10 MG/ML IJ SOLN
INTRAMUSCULAR | Status: DC | PRN
  Administered 2012-02-08 (×4): 80 ug via INTRAVENOUS

## 2012-02-08 MED ORDER — ALBUTEROL SULFATE HFA 108 (90 BASE) MCG/ACT IN AERS
2.0000 | INHALATION_SPRAY | Freq: Four times a day (QID) | RESPIRATORY_TRACT | Status: DC | PRN
  Filled 2012-02-08: qty 6.7

## 2012-02-08 MED ORDER — FENTANYL CITRATE 0.05 MG/ML IJ SOLN
INTRAMUSCULAR | Status: DC | PRN
Start: 2012-02-08 — End: 2012-02-08
  Administered 2012-02-08: 50 ug via INTRAVENOUS
  Administered 2012-02-08: 100 ug via INTRAVENOUS
  Administered 2012-02-08 (×2): 50 ug via INTRAVENOUS

## 2012-02-08 MED ORDER — SODIUM CHLORIDE 0.9 % IV SOLN
10.0000 mg | INTRAVENOUS | Status: DC | PRN
  Administered 2012-02-08: 50 ug/min via INTRAVENOUS

## 2012-02-08 MED ORDER — ALPRAZOLAM 0.25 MG PO TABS
0.2500 mg | ORAL_TABLET | Freq: Two times a day (BID) | ORAL | Status: DC
  Administered 2012-02-08 – 2012-02-09 (×2): 0.25 mg via ORAL
  Filled 2012-02-08 (×2): qty 1

## 2012-02-08 MED ORDER — POVIDONE-IODINE 7.5 % EX SOLN: Freq: Once | CUTANEOUS | Status: DC

## 2012-02-08 MED ORDER — SUCCINYLCHOLINE CHLORIDE 20 MG/ML IJ SOLN
INTRAMUSCULAR | Status: DC | PRN
  Administered 2012-02-08: 100 mg via INTRAVENOUS

## 2012-02-08 SURGICAL SUPPLY — 43 items
ANCHOR PEEK ZIP 5.5 NDL NO2 (Orthopedic Implant) ×2 IMPLANT
BAG ZIPLOCK 12X15 (MISCELLANEOUS) ×2 IMPLANT
BLADE OSCILLATING/SAGITTAL (BLADE) ×1
BLADE SW THK.38XMED LNG THN (BLADE) ×1 IMPLANT
CLEANER TIP ELECTROSURG 2X2 (MISCELLANEOUS) ×2 IMPLANT
CLOTH BEACON ORANGE TIMEOUT ST (SAFETY) ×2 IMPLANT
CONT SPECI 4OZ STER CLIK (MISCELLANEOUS) IMPLANT
COVER SURGICAL LIGHT HANDLE (MISCELLANEOUS) ×2 IMPLANT
DRAPE LG THREE QUARTER DISP (DRAPES) ×2 IMPLANT
DRAPE POUCH INSTRU U-SHP 10X18 (DRAPES) ×2 IMPLANT
DRAPE U-SHAPE 47X51 STRL (DRAPES) ×2 IMPLANT
DRSG EMULSION OIL 3X3 NADH (GAUZE/BANDAGES/DRESSINGS) ×2 IMPLANT
DURAPREP 26ML APPLICATOR (WOUND CARE) ×2 IMPLANT
ELECT REM PT RETURN 9FT ADLT (ELECTROSURGICAL) ×2
ELECTRODE REM PT RTRN 9FT ADLT (ELECTROSURGICAL) ×1 IMPLANT
FACESHIELD LNG OPTICON STERILE (SAFETY) ×2 IMPLANT
GLOVE BIOGEL M 8.0 STRL (GLOVE) ×2 IMPLANT
GLOVE ECLIPSE 8.0 STRL XLNG CF (GLOVE) ×2 IMPLANT
GLOVE INDICATOR 8.0 STRL GRN (GLOVE) ×6 IMPLANT
GOWN STRL REIN XL XLG (GOWN DISPOSABLE) ×6 IMPLANT
KIT BASIN OR (CUSTOM PROCEDURE TRAY) ×2 IMPLANT
MANIFOLD NEPTUNE II (INSTRUMENTS) ×2 IMPLANT
NEEDLE MA TROC 1/2 (NEEDLE) IMPLANT
NS IRRIG 1000ML POUR BTL (IV SOLUTION) ×2 IMPLANT
PACK SHOULDER CUSTOM OPM052 (CUSTOM PROCEDURE TRAY) ×2 IMPLANT
PATCH TISSUE MEND 3X3CM (Orthopedic Implant) ×2 IMPLANT
POSITIONER SURGICAL ARM (MISCELLANEOUS) ×2 IMPLANT
SLING ARM FOAM STRAP LRG (SOFTGOODS) ×2 IMPLANT
SLING ARM IMMOBILIZER LRG (SOFTGOODS) ×2 IMPLANT
SPONGE GAUZE 4X4 12PLY (GAUZE/BANDAGES/DRESSINGS) ×2 IMPLANT
SPONGE SURGIFOAM ABS GEL 100 (HEMOSTASIS) ×2 IMPLANT
SPONGE SURGIFOAM ABS GEL 12-7 (HEMOSTASIS) IMPLANT
STAPLER VISISTAT 35W (STAPLE) ×2 IMPLANT
SUCTION FRAZIER 12FR DISP (SUCTIONS) ×2 IMPLANT
SUT BONE WAX W31G (SUTURE) ×2 IMPLANT
SUT ETHIBOND NAB CT1 #1 30IN (SUTURE) IMPLANT
SUT VIC AB 1 CT1 27 (SUTURE) ×2
SUT VIC AB 1 CT1 27XBRD ANTBC (SUTURE) ×2 IMPLANT
SUT VIC AB 2-0 CT1 27 (SUTURE) ×2
SUT VIC AB 2-0 CT1 27XBRD (SUTURE) ×2 IMPLANT
SUT VIC AB 3-0 PS2 18 (SUTURE) ×3
SUT VIC AB 3-0 PS2 18XBRD (SUTURE) ×3 IMPLANT
TAPE CLOTH SURG 6X10 WHT LF (GAUZE/BANDAGES/DRESSINGS) ×2 IMPLANT

## 2012-02-08 NOTE — Transfer of Care (Signed)
Immediate Anesthesia Transfer of Care Note  Patient: Alexandria [name]  Procedure(s) Performed: Procedure(s) (LRB) with comments: ROTATOR CUFF REPAIR SHOULDER OPEN (Left) - Left Shoulder Open Anterior Acrominectomy Rotator Cuff Repair Open Distal Clavicle Resection ,tissue mend graft, and repair of biceps tendon  Patient Location: PACU  Anesthesia Type:GA combined with regional for post-op pain  Level of Consciousness: awake, alert  and patient cooperative  Airway & Oxygen Therapy: Patient Spontanous Breathing and Patient connected to face mask oxygen  Post-op Assessment: Report given to PACU RN and Post -op Vital signs reviewed and stable  Post vital signs: Reviewed and stable  Complications: No apparent anesthesia complications

## 2012-02-08 NOTE — Brief Op Note (Signed)
02/08/2012  2:53 PM  PATIENT:  Alexandria [name]  76 y.o. female  PRE-OPERATIVE DIAGNOSIS:  left shoulder rotator cuff tear   POST-OPERATIVE DIAGNOSIS:  left shoulder rotator cuff tear   PROCEDURE:  Procedure(s) (LRB) with comments: ROTATOR CUFF REPAIR SHOULDER OPEN (Left) - Left Shoulder Open Anterior Acrominectomy Rotator Cuff Repair Open Distal Clavicle Resection ,tissue mend graft, and repair of biceps tendon  SURGEON:  Surgeon(s) and Role:    * Drucilla Schmidt, MD - Primary  PHYSICIAN ASSISTANT:   ASSISTANTS: Skip Mayer PAC  ANESTHESIA:   regional and general  EBL:  Total I/O In: 850 [I.V.:850] Out: -   BLOOD ADMINISTERED:none  DRAINS: none   LOCAL MEDICATIONS USED:  NONE  SPECIMEN:  No Specimen  DISPOSITION OF SPECIMEN:  N/A  COUNTS:  YES  TOURNIQUET:  * No tourniquets in log *  DICTATION: .Other Dictation: Dictation Number [id number]  PLAN OF CARE: Admit for overnight observation  PATIENT DISPOSITION:  PACU - hemodynamically stable.   Delay start of Pharmacological VTE agent (>24hrs) due to surgical blood loss or risk of bleeding: yes

## 2012-02-08 NOTE — Anesthesia Procedure Notes (Addendum)
Anesthesia Regional Block:  Interscalene brachial plexus block  Pre-Anesthetic Checklist: ,, timeout performed, Correct Patient, Correct Site, Correct Laterality, Correct Procedure, Correct Position, site marked, Risks and benefits discussed,  Surgical consent,  Pre-op evaluation,  At surgeon's request and post-op pain management   Prep: chloraprep       Needles:  Injection technique: Single-shot  Needle Type: Echogenic Needle          Additional Needles:  Procedures: ultrasound guided (picture in chart) Interscalene brachial plexus block Narrative:  Start time: 02/08/2012 12:10 PM End time: 02/08/2012 12:20 PM Injection made incrementally with aspirations every 5 mL.  Performed by: Personally  Anesthesiologist: [name] MD  Additional Notes: Patient tolerated the procedure well without complications  Interscalene brachial plexus block

## 2012-02-08 NOTE — Anesthesia Postprocedure Evaluation (Signed)
  Anesthesia Post-op Note  Patient: Alexandria [name]  Procedure(s) Performed: Procedure(s) (LRB): ROTATOR CUFF REPAIR SHOULDER OPEN (Left)  Patient Location: PACU  Anesthesia Type: GA combined with regional for post-op pain  Level of Consciousness: awake and alert   Airway and Oxygen Therapy: Patient Spontanous Breathing  Post-op Pain: mild  Post-op Assessment: Post-op Vital signs reviewed, Patient's Cardiovascular Status Stable, Respiratory Function Stable, Patent Airway and No signs of Nausea or vomiting  Last Vitals:  Filed Vitals:   02/08/12 1515  BP: 126/56  Pulse: 81  Temp: 37.1 C  Resp: 13    Post-op Vital Signs: stable   Complications: No apparent anesthesia complications

## 2012-02-08 NOTE — H&P (Signed)
Alexandria [name] is an 76 y.o. female.   Chief Complaint: painful lt shoulder HPI: MRI  Demonstrates ac arthritis, torn retracted RCT, partial biceps tendon rear  Past Medical History  Diagnosis Date  . Depression 12/07/1998  . Hypothyroidism 03/08/1968  . Hypertension 03/08/1992  . Hyperlipidemia 10/04/2000  . Diabetes mellitus type II 02/05/2006    Dr. Leslie Dales with endo  . COPD (chronic obstructive pulmonary disease) 03/08/1998    PFTs 12/12/2002 FEV 1 1.42 (64%) ratio 58 with no better after B2 and DLCO75%; PFTs 11/20/09 FEV1 1.50 (73%) ratio 50 no better after B2 with DLCO 62%; Hfa 50% 11/20/2009 >75%, 01/13/10 p coaching  . Ventral hernia   . Esophagitis   . Allergy, unspecified not elsewhere classified   . Disorders of bursae and tendons in shoulder region, unspecified     Rotator cuff syndrome, right  . Obesity     NOS  . Peripheral neuropathy     Likely due to DM per Dr. Sandria Manly  . OSA (obstructive sleep apnea)     PSG 01/27/10 AHI 13, pt does not know CPAP settings  . GERD (gastroesophageal reflux disease)     Past Surgical History  Procedure Date  . Tonsillectomy   . Rotator cuff repair 1984    Right, [place]  . Cesarean section     x2 Breech/ repeat  . Total abdominal hysterectomy 1985    Due to dysmennorhea  . Cholecystectomy 1997  . Thumb release 12/1997    Right  . Carpal tunnel release 12/1997    Right  . Esophagogastroduodenoscopy 12/05/1997    Nml (due to hoarseness)  . Abd u/s 03/19/1999    Nml x2 foci in liver  . Ct abd w & pelvis wo/w cm     Abd hemangiomas of liver, 1 cm R renal cyst  . Cardiolite persantine 08/24/2000    Nml  . Carotid u/s 08/24/2000    1-39% ICA stenosis  . Dexa 07/03/2003    Nml  . Adenosine myoview 06/02/2007    Nml  . Carotid u/s 06/02/2007    No apprec change   . US echocardiography 06/02/2007  . Hernia repair 01/24/2009    Lap Ventr w/ Lysis of adhesions (Dr. Dwain Sarna)  . Knee arthroscopic surgery years ago   right    Family History  Problem Relation Age of Onset  . Heart disease Mother   . Thyroid disease Mother   . Emphysema Father     One lung  . Breast cancer Sister   . Cystic fibrosis Sister   . Hyperthyroidism Sister   . Esophageal cancer Neg Hx   . Cancer Neg Hx     Head or neck  . Osteoarthritis Brother   . Hyperthyroidism Brother    Social History:  reports that she quit smoking about 6 years ago. Her smoking use included Cigarettes. She has a 100 pack-year smoking history. She has never used smokeless tobacco. She reports that she drinks about one ounce of alcohol per week. She reports that she does not use illicit drugs.  Allergies:  Allergies  Allergen Reactions  . Clarithromycin     REACTION: diff swallowing and mouth blisters  . Codeine Other (See Comments)    Unknown   . Sulfonamide Derivatives     REACTION: closed throat    Medications Prior to Admission  Medication Sig Dispense Refill  . ALPRAZolam (XANAX) 0.5 MG tablet Take 0.5 tablets (0.25 mg total) by mouth 2 (two) times daily. As  needed for anxiety  90 tablet  1  . aspirin 81 MG EC tablet Take 81 mg by mouth daily.        . Coenzyme Q-10 200 MG CAPS Take 1 capsule by mouth daily.        . Cyanocobalamin (VITAMIN B-12) 2500 MCG SUBL Place 1 tablet under the tongue daily.      Marland Kitchen ezetimibe (ZETIA) 10 MG tablet Take 1 tablet (10 mg total) by mouth at bedtime.      . famotidine (PEPCID) 20 MG tablet Take 20 mg by mouth at bedtime.       Marland Kitchen FLUoxetine (PROZAC) 10 MG capsule TAKE 2 CAPSULES IN THE MORNING AND TAKE 2 CAPSULES IN THE EVENING  360 capsule  3  . fluticasone (FLONASE) 50 MCG/ACT nasal spray USE 2 SPRAYS INTO THE NOSE EVERY 12 HOURS AS NEEDED FOR STUFFY NOSE  3 g  2  . l-methylfolate-B6-B12 (METANX) 3-35-2 MG TABS Take 1 tablet by mouth 2 (two) times daily.      Marland Kitchen levothyroxine (SYNTHROID, LEVOTHROID) 150 MCG tablet Take 150 mcg by mouth daily before breakfast. Can not take generic      . NEXIUM 40 MG  capsule TAKE 1 CAPSULE DAILY  90 each  1  . rosuvastatin (CRESTOR) 10 MG tablet Take 10 mg by mouth at bedtime.        . sitaGLIPtin (JANUVIA) 100 MG tablet Take 1 tablet (100 mg total) by mouth daily.      . SYMBICORT 160-4.5 MCG/ACT inhaler INHALE 2 PUFFS FIRST THING IN THE MORNING AND 2 PUFFS AGAIN IN THE EVENING ABOUT 12 HOURS LATER  3 Inhaler  1  . valsartan (DIOVAN) 160 MG tablet Take 160 mg by mouth daily before breakfast.      . albuterol (PROVENTIL HFA;VENTOLIN HFA) 108 (90 BASE) MCG/ACT inhaler Inhale 2 puffs into the lungs every 6 (six) hours as needed. Wheezing and shortness of breath      . EPINEPHrine (EPIPEN) 0.3 mg/0.3 mL DEVI Inject 0.3 mg into the muscle as directed.       . ergocalciferol (VITAMIN D2) 50000 UNITS capsule Take 50,000 Units by mouth once a week.        . nitroGLYCERIN (NITROSTAT) 0.4 MG SL tablet Place 0.4 mg under the tongue every 5 (five) minutes as needed. May repeat for up to 3 doses.      . NON FORMULARY CPAP        Results for orders placed during the hospital encounter of 02/08/12 (from the past 48 hour(s))  GLUCOSE, CAPILLARY     Status: Abnormal   Collection Time   02/08/12 10:20 AM      Component Value Range Comment   Glucose-Capillary 117 (*) 70 - 99 mg/dL    Comment 1 Documented in Chart      No results found.  ROS  Blood pressure 157/59, pulse 67, temperature 98.3 F (36.8 C), temperature source Oral, resp. rate 14, SpO2 100.00%. Physical Exam  Constitutional: She is oriented to person, place, and time. She appears well-developed and well-nourished.  HENT:  Head: Normocephalic and atraumatic.  Right Ear: External ear normal.  Left Ear: External ear normal.  Nose: Nose normal.  Mouth/Throat: Oropharynx is clear and moist.  Eyes: Conjunctivae normal and EOM are normal. Pupils are equal, round, and reactive to light.  Neck: Normal range of motion. Neck supple.  Cardiovascular: Normal rate, regular rhythm, normal heart sounds and intact  distal pulses.   Respiratory:  Effort normal and breath sounds normal.  GI: Soft. Bowel sounds are normal.  Musculoskeletal:       He has had an interscalene block lts [place]  Neurological: She is alert and oriented to person, place, and time. She has normal reflexes.  Skin: Skin is warm and dry.  Psychiatric: She has a normal mood and affect. Her behavior is normal. Judgment and thought content normal.     Assessment/Plan Torn rotator cuff, ac arthritis, partial biceps tendon tear Open anterior acromionectomy with rotator cuff repair, distal clavicle resection, repair biceps tendon  Myka Lukins P 02/08/2012, 12:34 PM

## 2012-02-08 NOTE — Anesthesia Preprocedure Evaluation (Addendum)
Anesthesia Evaluation  Patient identified by MRN, date of birth, ID band Patient awake    Reviewed: Allergy & Precautions, H&P , NPO status , Patient's Chart, lab work & pertinent test results  Airway Mallampati: II TM Distance: <3 FB Neck ROM: Full    Dental No notable dental hx. (+) Edentulous Upper   Pulmonary sleep apnea , COPD Severe COPD, RA sat to 60% with movement from stretcher to OR table   + decreased breath sounds      Cardiovascular hypertension, Rhythm:Regular Rate:Normal     Neuro/Psych negative neurological ROS  negative psych ROS   GI/Hepatic negative GI ROS, Neg liver ROS,   Endo/Other  negative endocrine ROSdiabetesHypothyroidism Morbid obesity  Renal/GU negative Renal ROS  negative genitourinary   Musculoskeletal negative musculoskeletal ROS (+)   Abdominal   Peds negative pediatric ROS (+)  Hematology negative hematology ROS (+)   Anesthesia Other Findings   Reproductive/Obstetrics negative OB ROS                         Anesthesia Physical Anesthesia Plan  ASA: III  Anesthesia Plan: General   Post-op Pain Management:    Induction: Intravenous  Airway Management Planned: Oral ETT  Additional Equipment:   Intra-op Plan:   Post-operative Plan: Extubation in OR  Informed Consent: I have reviewed the patients History and Physical, chart, labs and discussed the procedure including the risks, benefits and alternatives for the proposed anesthesia with the patient or authorized representative who has indicated his/her understanding and acceptance.   Dental advisory given  Plan Discussed with: CRNA and Surgeon  Anesthesia Plan Comments:         Anesthesia Quick Evaluation

## 2012-02-09 ENCOUNTER — Encounter (HOSPITAL_COMMUNITY): Payer: Self-pay | Admitting: Orthopedic Surgery

## 2012-02-09 MED ORDER — MEPERIDINE HCL 50 MG PO TABS: 50.0000 mg | ORAL_TABLET | ORAL | Status: DC | PRN

## 2012-02-09 MED ORDER — METHOCARBAMOL 500 MG PO TABS: 500.0000 mg | ORAL_TABLET | Freq: Four times a day (QID) | ORAL | Status: DC | PRN

## 2012-02-09 NOTE — Op Note (Signed)
Alexandria [name], Alexandria [name] NO.:  [id number]  MEDICAL RECORD NO.:  [id number]  LOCATION:  1614                         FACILITY:  [facility]  PHYSICIAN:  Marlowe Kays, M.D.  DATE OF BIRTH:  1935/03/16  DATE OF PROCEDURE:  02/08/2012 DATE OF DISCHARGE:                              OPERATIVE REPORT   PREOPERATIVE DIAGNOSES: 1. Degenerative arthritis acromioclavicular joint, left shoulder. 2. Torn rotator cuff, left shoulder. 3. Partial tear long head biceps tendon, left shoulder.  POSTOPERATIVE DIAGNOSES: 1. Degenerative arthritis acromioclavicular joint, left shoulder. 2. Torn rotator cuff, left shoulder. 3. Partial tear long head biceps tendon, left shoulder.  OPERATION: 1. Open distal clavicle resection. 2. Open anterior acromionectomy with repair of complex rotator cuff     tear. 3. Tenodesis of long head of biceps tendon. 4. Application of TissueMend graft to rotator cuff.  SURGEON:  Marlowe Kays, M.D.  ASSISTANT:  Alexandria Mayer, PA-C  ANESTHESIA:  General preceded by interscalene block.  PATHOLOGY AND JUSTIFICATION FOR PROCEDURE:  She has had an MRI demonstrating the above preoperative diagnoses.  At surgery, the rotator cuff was quite thinned out prompting my decision to utilize the TissueMend graft.  Also, [name]' assistance was necessary because of the complexity of the operation with holding the arm and instrumentation.  PROCEDURE:  Prophylactic antibiotics, satisfactory interscalene block by anesthesia, satisfied general anesthesia, beach-chair position on the Allen frame, left shoulder girdle was prepped with DuraPrep, draped in sterile field.  Ioban employed.  Time-out performed.  I made a vertical incision over the distal clavicle and AC joint curving downward to about the level of the greater tuberosity.  I dissected the fascia over the distal clavicle at the [facility] joint, much of it with subperiosteal cautery dissection.  The [facility] joint was  identified and then I marked out a 1.5 cm from the joint and after undermining the clavicle at this point with small periosteal elevator, I placed baby Bennett beneath the clavicle and using microsaw, cut the clavicle at this point, removing the fragment with towel clip and cutting cautery technique.  I checked, there were no more remaining bony spicules.  Applied bone wax over the raw bone.  I then extended the incision distalward, past the anterior acromion.  I then undermined this with a small Cobb elevator after marking off my initial area for anterior acromionectomy.  I made my first cut followed by a second further decompressing and the obviously torn rotator cuff beneath it.  I then used a rasp to smooth down the remainder of the undersurface of the acromion.  This gave a nice wide decompression.  I then performed large bursectomy, which allowed me to further visualize the rotator cuff, which measured about 5 cm tear from anterior to posterior.  I also released some of the sheath and the rotator cuff over the biceps tendon, so I could visualize the biceps tendon and there was about 4-cm section there where impingement had created at the partial tear of the rotator cuff.  I felt that because of the partial tearing and extent that give really was not much to show, but tenodesis would be the best way to stabilize this.  I  then used a small rongeur to roughen up the bone adjacent to the greater tuberosity. I then placed a 4-stranded Stryker anchor and working from anteriorly to posteriorly, used two of these sutures, which I rolled through the lateral attachment of the rotator cuff, which remained and through the rotator cuff tendon itself and then held this with hemostat and worked posteriorly.  I used the other two strands to tenodese the long head of the biceps tendon down the tissue adjacent to it making sure that the distal suture was in healthy tendon.  After this had been  stabilized, I then continued using the two sutures to tack down the posterior portion of the rotator cuff as I had anteriorly.  I then supplemented gaps in the tendon attachments anteriorly, posteriorly and laterally with the same rotator cuff suture.  This seemed to give a nice stable repair. Then because of the thinness of the rotator cuff, I elected to go ahead and used a 3-cm TissueMend graft, which I tacked down with interrupted 3- 0 Vicryl.  This gave a nice protective covering not only over the sutures from the rotator cuff repair, but also the thinned out tendon. I irrigated the wound well with sterile saline and placed a Gelfoam in the distal clavicle resection site and closed the wound with interrupted #1 Vicryl and the small slit in the deltoid muscle and over the fascia over the residual anterior acromion and the AC joint.  Subcutaneous tissue was closed with combination of 2-0 and 3-0 Vicryl, and staples in the skin.  Betadine, Adaptic, dry sterile dressing were applied followed by shoulder immobilizer.  She tolerated the procedure well, was taken to the recovery room in satisfied condition with no known complications.          ______________________________ Marlowe Kays, M.D.     JA/MEDQ  D:  02/08/2012  T:  02/09/2012  Job:  [id number]

## 2012-02-09 NOTE — Progress Notes (Signed)
Utilization review completed.  

## 2012-02-09 NOTE — Discharge Summary (Signed)
NAMECEDRIC, DENISON NO.:  [id number]  MEDICAL RECORD NO.:  [id number]  LOCATION:  1614                         FACILITY:  [facility]  PHYSICIAN:  Marlowe Kays, M.D.  DATE OF BIRTH:  08/04/1935  DATE OF ADMISSION:  02/08/2012 DATE OF DISCHARGE:  02/09/2012                              DISCHARGE SUMMARY   ADMITTING DIAGNOSES: 1. Complete rotator cuff tear. 2. Acromioclavicular joint arthritis. 3. Partial tear long head biceps tendon, left shoulder.  DISCHARGE DIAGNOSES: 1. Complete rotator cuff tear. 2. Acromioclavicular joint arthritis. 3. Partial tear long head biceps tendon, left shoulder.  Operation on February 08, 2012. 1. Open anterior acromionectomy with repair of torn rotator cuff. 2. Open distal clavicle resection. 3. Open biceps tenodesis. 4. Application of tissue main graft to rotator cuff, left shoulder.  In summary, this woman has had a long history of progressive left shoulder pain and ultimately had an MRI of the left shoulder demonstrating the above-mentioned diagnoses.  Accordingly she was brought in yesterday for the above-mentioned surgery, which was performed uneventfully with an interscalene block and general anesthetic.  Today her block is still in place.  She has had a comfortable night.  She is being discharged on her admission medications as well as Demerol 50 mg #30 and Robaxin 500 mg #30.  She is given instructions to keep the dressing dry in her elbow to her side and to stay in a sling with return appointment to my office on December 6. Condition at discharge is stable and improved.          ______________________________ Marlowe Kays, M.D.     JA/MEDQ  D:  02/09/2012  T:  02/09/2012  Job:  [id number]

## 2012-02-09 NOTE — Progress Notes (Signed)
Patient ID: Alexandria [name], female   DOB: September 10, 1935, 76 y.o.   MRN: [id number] She had a good night.  Block still in effect.  Will disch to home with return to offoce 12/6--inst given.

## 2012-03-27 DIAGNOSIS — H02419 Mechanical ptosis of unspecified eyelid: Secondary | ICD-10-CM | POA: Diagnosis not present

## 2012-03-27 DIAGNOSIS — H11439 Conjunctival hyperemia, unspecified eye: Secondary | ICD-10-CM | POA: Diagnosis not present

## 2012-04-11 DIAGNOSIS — E782 Mixed hyperlipidemia: Secondary | ICD-10-CM | POA: Diagnosis not present

## 2012-04-11 DIAGNOSIS — E538 Deficiency of other specified B group vitamins: Secondary | ICD-10-CM | POA: Diagnosis not present

## 2012-04-11 DIAGNOSIS — E559 Vitamin D deficiency, unspecified: Secondary | ICD-10-CM | POA: Diagnosis not present

## 2012-04-11 DIAGNOSIS — E1149 Type 2 diabetes mellitus with other diabetic neurological complication: Secondary | ICD-10-CM | POA: Diagnosis not present

## 2012-04-11 DIAGNOSIS — R82998 Other abnormal findings in urine: Secondary | ICD-10-CM | POA: Diagnosis not present

## 2012-04-11 DIAGNOSIS — E039 Hypothyroidism, unspecified: Secondary | ICD-10-CM | POA: Diagnosis not present

## 2012-04-13 DIAGNOSIS — E1149 Type 2 diabetes mellitus with other diabetic neurological complication: Secondary | ICD-10-CM | POA: Diagnosis not present

## 2012-04-13 DIAGNOSIS — E559 Vitamin D deficiency, unspecified: Secondary | ICD-10-CM | POA: Diagnosis not present

## 2012-04-13 DIAGNOSIS — E538 Deficiency of other specified B group vitamins: Secondary | ICD-10-CM | POA: Diagnosis not present

## 2012-04-13 DIAGNOSIS — E782 Mixed hyperlipidemia: Secondary | ICD-10-CM | POA: Diagnosis not present

## 2012-04-13 DIAGNOSIS — I10 Essential (primary) hypertension: Secondary | ICD-10-CM | POA: Diagnosis not present

## 2012-04-13 DIAGNOSIS — E039 Hypothyroidism, unspecified: Secondary | ICD-10-CM | POA: Diagnosis not present

## 2012-04-13 DIAGNOSIS — E1142 Type 2 diabetes mellitus with diabetic polyneuropathy: Secondary | ICD-10-CM | POA: Diagnosis not present

## 2012-05-01 ENCOUNTER — Other Ambulatory Visit: Payer: Self-pay | Admitting: Family Medicine

## 2012-05-29 ENCOUNTER — Other Ambulatory Visit: Payer: Self-pay

## 2012-05-29 DIAGNOSIS — N390 Urinary tract infection, site not specified: Secondary | ICD-10-CM | POA: Diagnosis not present

## 2012-05-29 DIAGNOSIS — R350 Frequency of micturition: Secondary | ICD-10-CM | POA: Diagnosis not present

## 2012-05-29 MED ORDER — ALPRAZOLAM 0.5 MG PO TABS: 0.2500 mg | ORAL_TABLET | Freq: Two times a day (BID) | ORAL | Status: DC

## 2012-05-29 NOTE — Telephone Encounter (Signed)
Please call in.  If this needs to be faxed, then please let me know.

## 2012-05-29 NOTE — Telephone Encounter (Signed)
Pt left v/m requesting refill [facility] [phone] today. Pt request call back when med called in.Please advise.

## 2012-05-30 ENCOUNTER — Other Ambulatory Visit: Payer: Self-pay | Admitting: *Deleted

## 2012-05-30 MED ORDER — ALPRAZOLAM 0.5 MG PO TABS: 0.2500 mg | ORAL_TABLET | Freq: Two times a day (BID) | ORAL | Status: DC

## 2012-05-30 NOTE — Telephone Encounter (Signed)
Printed and signed, in my office.

## 2012-05-30 NOTE — Telephone Encounter (Signed)
Faxed

## 2012-05-30 NOTE — Telephone Encounter (Signed)
This does need to be faxed. Please print and sign.

## 2012-08-24 ENCOUNTER — Ambulatory Visit (INDEPENDENT_AMBULATORY_CARE_PROVIDER_SITE_OTHER): Payer: BC Managed Care – PPO | Admitting: Internal Medicine

## 2012-08-24 ENCOUNTER — Encounter: Payer: Self-pay | Admitting: Internal Medicine

## 2012-08-24 ENCOUNTER — Other Ambulatory Visit (INDEPENDENT_AMBULATORY_CARE_PROVIDER_SITE_OTHER): Payer: BC Managed Care – PPO

## 2012-08-24 VITALS — BP 100/64 | HR 86 | Temp 98.1°F | Ht 63.5 in | Wt 231.8 lb

## 2012-08-24 DIAGNOSIS — R0600 Dyspnea, unspecified: Secondary | ICD-10-CM

## 2012-08-24 DIAGNOSIS — R0609 Other forms of dyspnea: Secondary | ICD-10-CM | POA: Diagnosis not present

## 2012-08-24 DIAGNOSIS — R0602 Shortness of breath: Secondary | ICD-10-CM | POA: Insufficient documentation

## 2012-08-24 DIAGNOSIS — J449 Chronic obstructive pulmonary disease, unspecified: Secondary | ICD-10-CM

## 2012-08-24 DIAGNOSIS — R0989 Other specified symptoms and signs involving the circulatory and respiratory systems: Secondary | ICD-10-CM

## 2012-08-24 LAB — BRAIN NATRIURETIC PEPTIDE: Pro B Natriuretic peptide (BNP): 34 pg/mL (ref 0.0–100.0)

## 2012-08-24 LAB — BASIC METABOLIC PANEL
BUN: 21 mg/dL (ref 6–23)
CO2: 28 mEq/L (ref 19–32)
Calcium: 9.6 mg/dL (ref 8.4–10.5)
Chloride: 102 mEq/L (ref 96–112)
Creatinine, Ser: 1 mg/dL (ref 0.4–1.2)
GFR: 56.8 mL/min — ABNORMAL LOW (ref >60.00)
Glucose, Bld: 164 mg/dL — ABNORMAL HIGH (ref 70–99)
Potassium: 4.4 mEq/L (ref 3.5–5.1)
Sodium: 139 mEq/L (ref 135–145)

## 2012-08-24 NOTE — Patient Instructions (Addendum)
Only use your albuterol(proventil) as a rescue medication to be used if you can't catch your breath by resting or doing a relaxed purse lip breathing pattern. The less you use it, the better it will work when you need it. Ok to use up to 2 puffs every 4 hours but goal is less than twice weekly  Work on inhaler technique:  relax and gently blow all the way out then take a nice smooth deep breath back in, triggering the inhaler at same time you start breathing in.  Hold for up to 5 seconds if you can.  Rinse and gargle with water when done   If your mouth or throat starts to bother you,   I suggest you time the inhaler to your dental care and after using the inhaler(s) brush teeth and tongue with a baking soda containing toothpaste and when you rinse this out, gargle with it first to see if this helps your mouth and throat.     Please remember to go to the lab and x-ray department downstairs for your tests - we will call you with the results when they are available.     Please schedule a follow up office visit in 6 weeks, call sooner if needed with pfts

## 2012-08-24 NOTE — Assessment & Plan Note (Addendum)
-   PFT's 12/12/2002 FEV1 1.42 (64%) ratio 58 with no better after B2 and DLCO75%  - PFT's 9/ 15/11 FEV1 1.50 (73%) ratio 50 no better after B2 with DLC0 62%  - hfa 50% November 20, 2009 > 75% January 13, 2010 p coaching >  75% 10/02/2010  >>referred to pulmonary rehab 10/19/2010 > some better doe - Alpha one AT genotype 04/15/2011 > MM -   08/24/2012  Walked RA x 1 laps @ 185 ft each stopped due to feet hurt > sob s desat  DDX of  difficult airways managment all start with A and  include Adherence, Ace Inhibitors, Acid Reflux, Active Sinus Disease, Alpha 1 Antitripsin deficiency, Anxiety masquerading as Airways dz,  ABPA,  allergy(esp in young), Aspiration (esp in elderly), Adverse effects of DPI,  Active smokers, plus two Bs  = Bronchiectasis and Beta blocker use..and one C= CHF  Adherence is always the initial "prime suspect" and is a multilayered concern that requires a "trust but verify" approach in every patient - starting with knowing how to use medications, especially inhalers, correctly, keeping up with refills and understanding the fundamental difference between maintenance and prns vs those medications only taken for a very short course and then stopped and not refilled. The proper method of use, as well as anticipated side effects, of a metered-dose inhaler are discussed and demonstrated to the patient. Improved effectiveness after extensive coaching during this visit to a level of approximately  75% so continue symbicort and prn saba for now  ? Anxiety/ depression >dx of exclusion but this plus obesity / deconditioning are probably the most important aspect of her car.   ? Acid reflux > max gerd rx reviewed, continue  ? CHF > check bnp

## 2012-08-24 NOTE — Progress Notes (Signed)
Subjective:     Patient ID: Alexandria [name], female   DOB: [birth year]    MRN: [id number]   Brief patient profile:  76  yowf quit smoking Dec 2006 with GOLD II COPD FEV1 1.42 (64%) in 2004   HPI September 16, 2009 1st pulmonary office eval in emr She c/o worsening SOB for the past several yrs. She gets out of breath walking up stairs and walking flat surface approx 500 ft. She states that hot/humid weather makes breathing worse. supposed to be taking spiriva and advair but not consistent. better after proaire though hfa technique margnial. Notes mild reflux/ dysphagia/ hoarseness also rec .stop advair  Symbicort 2 puffs first thing in am and 2 puffs again in pm about 12 hours later  Take spiriva after symbicort  if needed proaire 2 puffs every 4 hours with goal of needing it less than twice daily  Nexium Take one 30-60 min before first meal of the day      11/24/2010 f/u ov/Shatima Zalar cc breathing no better and using excess saba but no increase cough or purulent secretions or overt HB on present complex regimen which appears to be following per med calendar rec Work on inhaler technique:  GERD diet Only use proair/combent  if can't catch  your breath at rest. Prednisone x 6 day taper off  Try Combivent 2 puffs every 4 hours in place of proaire should work better and last longer    04/15/2011 f/u ov/Thanh Mottern  Did not bring med calendar, not finding it useful despite complex rx, cc  breathing has improved since being in pulm rehab. No cough now that off dpi. Still needs HC parking with large events like concerts. rec Avelox 400mg  daily for 7 days  Mucinex DM Twice daily As needed  Cough/congestion  Prednisone taper over next week.  Fluids and rest  I will call with xray results.    08/24/2012 f/u ov/Laster Appling re GOLD II COPD  Chief Complaint  Patient presents with  . Acute Visit    Pt c/o DOE gradually worsening for the past 6 months. She gets OOB walking from room to room at home, and can not walk through  the grocery store any longer.     No obvious daytime variabilty or assoc chronic cough or cp or chest tightness, subjective wheeze overt sinus or hb symptoms. No unusual exp hx or h/o childhood pna/ asthma or knowledge of premature birth.  Has not tried saba to see if helps  Sleeping ok without nocturnal  or early am exacerbation  of respiratory  c/o's or need for noct saba. Also denies any obvious fluctuation of symptoms with weather or environmental changes or other aggravating or alleviating factors except as outlined above    Current Medications, Allergies, Past Medical History, Past Surgical History, Family History, and Social History were reviewed in [employer] record.  ROS  The following are not active complaints unless bolded sore throat, dysphagia, dental problems, itching, sneezing,  nasal congestion or excess/ purulent secretions, ear ache,   fever, chills, sweats, unintended wt loss, pleuritic or exertional cp, hemoptysis,  orthopnea pnd or leg swelling, presyncope, palpitations, heartburn, abdominal pain, anorexia, nausea, vomiting, diarrhea  or change in bowel or urinary habits, change in stools or urine, dysuria,hematuria,  rash, arthralgias, visual complaints, headache, numbness weakness or ataxia or problems with walking or coordination,  change in mood/affect or memory.            Past Medical History:  Depression (  12/07/1998)  Hypothyroidism (03/08/1968)  Hypertension (03/08/1992)  Hyperlipidemia (10/04/2000)  Diabetes mellitus, type II (02/05/2006)- Dr. Leslie Dales with endo  COPD (03/08/1998)  - PFT's 12/12/2002 FEV1 1.42 (64%) ratio 58 with no better after B2 and DLCO75%  - PFT's 9/ 15/11 FEV1 1.50 (73%) ratio 50 no better after B2 with DLC0 62%  - hfa 50% November 20, 2009 > 75% January 13, 2010 p coaching >  75% 10/02/2010  VENTRAL HERNIA (ICD-553.20)  ESOPHAGITIS (ICD-530.10)  EDEMA (ICD-782.3)  SOB (ICD-786.05)  ALLERGY (ICD-995.3)   BRONCHITIS, OBSTRUCTIVE CHRONIC W/EXACRB (ICD-491.21)  ROTATOR CUFF SYNDROME, RIGHT (ICD-726.10)  SYNDROME, ROTATOR CUFF NOS (ICD-726.10)  OBESITY NOS (ICD-278.00)  NUMBNESS (ICD-782.0)  POSTMENOPAUSAL ON HORMONE REPLACEMENT THERAPY (ICD-V07.4)  Complex med regimen , med calendar 10/19/10     Family History:  Father dec 75 Emphysema One lung  Mother dec 93 Heart Thyrroid Dz  Brother A 31 Unknown  Brother A 66 Osteo Hyperthyr  Brother A 62 BACK Probs  Sister A 60 Breast Cysts Fibro Back Probs Hypothyr  no esophageal cancer  No known head and neck cancers.  no premature ascvd    Social History:  married, 2 children, she is retired, former smoker, drinks 2 alcoholic beverages per week, drinks 4 caffeinated beverages per day. Enjoys painting.  Former smoker. Quit on July 9th 2011. She smoked 40 yrs up to 1 ppd           Objective:   Physical Exam  Elderly amb wf NAD depressed affect  wt 229 September 16, 2009 >  >235 10/19/10 >04/15/2011  234 >07/16/2011 226 > 232 08/24/2012   GEN: A/Ox3; pleasant , NAD, obese with classic voice fatigue   HEENT:  [place]/AT,  EACs-clear, TMs-wnl, NOSE-clear, THROAT-clear, no lesions, no postnasal drip or exudate noted.   NECK:  Supple w/ fair ROM; no JVD; normal carotid impulses w/o bruits; no thyromegaly or nodules palpated; no lymphadenopathy.  RESP  : clear to a and p   CARD:  RRR, no m/r/g  , no peripheral edema, pulses intact, no cyanosis or clubbing.  GI:   Soft & nt; nml bowel sounds; no organomegaly or masses detected.  Musco: Warm bil, no deformities or joint swelling noted.   Neuro: alert, no focal deficits noted.    Skin: Warm, no lesions or rashes  07/16/11 cxr No acute chest findings.     cxr 08/24/2012 not done as requested  Assessment:         Plan:

## 2012-08-24 NOTE — Assessment & Plan Note (Signed)
See copd/ w/u in progress

## 2012-08-25 LAB — CBC WITH DIFFERENTIAL/PLATELET
Basophils Absolute: 0 10*3/uL (ref 0.0–0.1)
Basophils Relative: 0.3 % (ref 0.0–3.0)
Eosinophils Absolute: 0.1 10*3/uL (ref 0.0–0.7)
Eosinophils Relative: 1.7 % (ref 0.0–5.0)
HCT: 35 % — ABNORMAL LOW (ref 36.0–46.0)
Hemoglobin: 11.3 g/dL — ABNORMAL LOW (ref 12.0–15.0)
Lymphocytes Relative: 24 % (ref 12.0–46.0)
Lymphs Abs: 2.1 10*3/uL (ref 0.7–4.0)
MCHC: 32.3 g/dL (ref 30.0–36.0)
MCV: 80.3 fl (ref 78.0–100.0)
Monocytes Absolute: 0.5 10*3/uL (ref 0.1–1.0)
Monocytes Relative: 5.3 % (ref 3.0–12.0)
Neutro Abs: 5.9 10*3/uL (ref 1.4–7.7)
Neutrophils Relative %: 68.7 % (ref 43.0–77.0)
Platelets: 256 10*3/uL (ref 150.0–400.0)
RBC: 4.35 Mil/uL (ref 3.87–5.11)
RDW: 17 % — ABNORMAL HIGH (ref 11.5–14.6)
WBC: 8.7 10*3/uL (ref 4.5–10.5)

## 2012-08-29 NOTE — Progress Notes (Signed)
Quick Note:  Spoke with pt and notified of results per Dr. Wert. Pt verbalized understanding and denied any questions.  ______ 

## 2012-09-14 ENCOUNTER — Other Ambulatory Visit: Payer: Self-pay

## 2012-09-15 ENCOUNTER — Encounter: Payer: Self-pay | Admitting: Family Medicine

## 2012-09-15 ENCOUNTER — Ambulatory Visit (INDEPENDENT_AMBULATORY_CARE_PROVIDER_SITE_OTHER): Payer: BC Managed Care – PPO | Admitting: Family Medicine

## 2012-09-15 VITALS — BP 122/84 | HR 81 | Temp 98.1°F | Wt 242.6 lb

## 2012-09-15 DIAGNOSIS — R0609 Other forms of dyspnea: Secondary | ICD-10-CM

## 2012-09-15 DIAGNOSIS — R609 Edema, unspecified: Secondary | ICD-10-CM | POA: Diagnosis not present

## 2012-09-15 DIAGNOSIS — R0989 Other specified symptoms and signs involving the circulatory and respiratory systems: Secondary | ICD-10-CM | POA: Diagnosis not present

## 2012-09-15 DIAGNOSIS — R600 Localized edema: Secondary | ICD-10-CM

## 2012-09-15 DIAGNOSIS — R0600 Dyspnea, unspecified: Secondary | ICD-10-CM

## 2012-09-15 MED ORDER — PREDNISONE 20 MG PO TABS: ORAL_TABLET | ORAL | Status: DC

## 2012-09-15 MED ORDER — FLUTICASONE PROPIONATE 50 MCG/ACT NA SUSP: NASAL | Status: DC

## 2012-09-15 MED ORDER — PREDNISONE 10 MG PO TABS: ORAL_TABLET | ORAL | Status: DC

## 2012-09-15 MED ORDER — FUROSEMIDE 20 MG PO TABS: 10.0000 mg | ORAL_TABLET | Freq: Every day | ORAL | Status: DC

## 2012-09-15 NOTE — Assessment & Plan Note (Addendum)
Anticipate multifactorial - including recent NSAID use, CPAP non-adherence over last month. Noted weight gain of 11lbs in the last month. Will start lasix 10-20 mg daily for next 3 days, and then use PRN. If not improved with this, recommended notify us or return for further evaluation - would obtain 2D echo. Reviewed recent normal blood work including BNP - ?right sided heart failure given h/o COPD and OSA.

## 2012-09-15 NOTE — Addendum Note (Signed)
Addended by: Eustaquio Boyden on: 09/15/2012 04:36 PM   Modules accepted: Orders, Medications

## 2012-09-15 NOTE — Assessment & Plan Note (Addendum)
With diffuse wheezing on exam today.  Doubt infectious cause today, but I do think she has some persistent airway inflammation from what was likely recent viral illness. I recommended short course of steroids - will need to monitor sugars on this. Pt endorses good glucose control recently. Discussed red flags to seek urgent care or return to see [place].

## 2012-09-15 NOTE — Patient Instructions (Signed)
I think this leg swelling is due to many things. Let's stop advil for now. For wheezing - start steroid course ( sent to pharmacy). For legs - may start low dose water pill - take 1/2 to 1 tablet daily for next 3 days then as needed. Restart CPAP machine for sleep apnea. Continue to stay well hydrated. If fever >101, worsening productive cough or shortness of breath, or colored sputum returning, please let us know or return to see [place].

## 2012-09-15 NOTE — Progress Notes (Signed)
Recheck O2 sat 94% Subjective:    Patient ID: Alexandria [name], female    DOB: 05/18/1935, 77 y.o.   MRN: [id number]  HPI CC: leg swelling  Patient of Dr. Lianne Bushy with h/o T2DM, HTN, HLD, hypothyroid, COPD, obesity and OSA on CPAP (not regular with this) presents today with concerns for progressively worsening dyspnea over last several months (sees Dr. Sherene Sires for COPD) as well as leg swelling for last 3 days.  Has been using minimally compressive stockings which hasn't helped.  + orthopnea and weight gain noted.  No PNdyspnea.   No h/o CHF.  Endorses recent cold which worsened breathing recently, now feeling better.  Still with residual cough and nasal congestion.  No fevers.  Taking more advil for the last 5 days.  Coughing up mucous recently, now mucous is clear.  Had TSH checked 3 mo ago. A1c always normal per patient.  Has been off CPAP for last 1 month - out of town then stopped 2/2 cold sxs.  Reviewed echo from 2011 - grade 1 diastolic dysfunction.  Normal RV systolic fxn and PA pressure.  Wt Readings from Last 3 Encounters:  09/15/12 242 lb 10 oz (110.054 kg)  08/24/12 231 lb 12.8 oz (105.144 kg)  01/31/12 233 lb (105.688 kg)   Lab Results  Component Value Date   CREATININE 1.0 08/24/2012   BNP    Component Value Date/Time   PROBNP 34.0 08/24/2012 1621    Past Medical History  Diagnosis Date  . Depression 12/07/1998  . Hypothyroidism 03/08/1968  . Hypertension 03/08/1992  . Hyperlipidemia 10/04/2000  . Diabetes mellitus type II 02/05/2006    Dr. Leslie Dales with endo  . COPD (chronic obstructive pulmonary disease) 03/08/1998    PFTs 12/12/2002 FEV 1 1.42 (64%) ratio 58 with no better after B2 and DLCO75%; PFTs 11/20/09 FEV1 1.50 (73%) ratio 50 no better after B2 with DLCO 62%; Hfa 50% 11/20/2009 >75%, 01/13/10 p coaching  . Ventral hernia   . Esophagitis   . Allergy, unspecified not elsewhere classified   . Disorders of bursae and tendons in shoulder region, unspecified      Rotator cuff syndrome, right  . Obesity     NOS  . Peripheral neuropathy     Likely due to DM per Dr. Sandria Manly  . OSA (obstructive sleep apnea)     PSG 01/27/10 AHI 13, pt does not know CPAP settings  . GERD (gastroesophageal reflux disease)     Review of Systems Per HPI    Objective:   Physical Exam  Nursing note and vitals reviewed. Constitutional: She appears well-developed and well-nourished. No distress.  Dyspneic with walk to exam room  HENT:  Head: Normocephalic and atraumatic.  Mouth/Throat: Oropharynx is clear and moist. No oropharyngeal exudate.  Eyes: Conjunctivae and EOM are normal. Pupils are equal, round, and reactive to light.  Neck: Normal range of motion. Neck supple.  Cardiovascular: Normal rate, regular rhythm and intact distal pulses.   Murmur heard. Pulmonary/Chest: Effort normal. No respiratory distress. She has wheezes (diffuse exp wheezing). She has no rales.  No crackles  Musculoskeletal: She exhibits edema (1+ to below knees bilat).  Skin: Skin is warm and dry. No rash noted.       Assessment & Plan:

## 2012-10-10 ENCOUNTER — Encounter: Payer: Self-pay | Admitting: Internal Medicine

## 2012-10-10 ENCOUNTER — Ambulatory Visit (INDEPENDENT_AMBULATORY_CARE_PROVIDER_SITE_OTHER): Payer: BC Managed Care – PPO | Admitting: Internal Medicine

## 2012-10-10 ENCOUNTER — Ambulatory Visit (INDEPENDENT_AMBULATORY_CARE_PROVIDER_SITE_OTHER)
Admission: RE | Admit: 2012-10-10 | Discharge: 2012-10-10 | Disposition: A | Discharge status: Home / Self Care | Payer: BC Managed Care – PPO | Source: Ambulatory Visit | Attending: Internal Medicine | Admitting: Internal Medicine

## 2012-10-10 VITALS — BP 110/62 | HR 78 | Temp 98.3°F | Ht 64.0 in | Wt 234.0 lb

## 2012-10-10 DIAGNOSIS — J449 Chronic obstructive pulmonary disease, unspecified: Secondary | ICD-10-CM

## 2012-10-10 DIAGNOSIS — R0609 Other forms of dyspnea: Secondary | ICD-10-CM

## 2012-10-10 DIAGNOSIS — R0600 Dyspnea, unspecified: Secondary | ICD-10-CM

## 2012-10-10 DIAGNOSIS — J4489 Other specified chronic obstructive pulmonary disease: Secondary | ICD-10-CM

## 2012-10-10 DIAGNOSIS — R0989 Other specified symptoms and signs involving the circulatory and respiratory systems: Secondary | ICD-10-CM

## 2012-10-10 LAB — PULMONARY FUNCTION TEST

## 2012-10-10 NOTE — Progress Notes (Signed)
PFT done today. 

## 2012-10-10 NOTE — Progress Notes (Signed)
Subjective:     Patient ID: Alexandria [name], female   DOB: [birth year]    MRN: [id number]   Brief patient profile:  77  yowf quit smoking Dec 2006 with GOLD II COPD FEV1 1.42 (64%) in 2004   HPI September 16, 2009 1st pulmonary office eval in emr She c/o worsening SOB for the past several yrs. She gets out of breath walking up stairs and walking flat surface approx 500 ft. She states that hot/humid weather makes breathing worse. supposed to be taking spiriva and advair but not consistent. better after proaire though hfa technique margnial. Notes mild reflux/ dysphagia/ hoarseness also rec .stop advair  Symbicort 2 puffs first thing in am and 2 puffs again in pm about 12 hours later  Take spiriva after symbicort  if needed proaire 2 puffs every 4 hours with goal of needing it less than twice daily  Nexium Take one 30-60 min before first meal of the day      11/24/2010 f/u ov/Alexandria [name] cc breathing no better and using excess saba but no increase cough or purulent secretions or overt HB on present complex regimen which appears to be following per med calendar rec Work on inhaler technique:  GERD diet Only use proair/combent  if can't catch  your breath at rest. Prednisone x 6 day taper off  Try Combivent 2 puffs every 4 hours in place of proaire should work better and last longer    04/15/2011 f/u ov/Alexandria [name]  Did not bring med calendar, not finding it useful despite complex rx, cc  breathing has improved since being in pulm rehab. No cough now that off dpi. Still needs HC parking with large events like concerts. rec Avelox 400mg  daily for 7 days  Mucinex DM Twice daily As needed  Cough/congestion  Prednisone taper over next week.  Fluids and rest  I will call with xray results.    08/24/2012 f/u ov/Alexandria [name] re GOLD II COPD  Chief Complaint  Patient presents with  . Acute Visit    Pt c/o DOE gradually worsening for the past 6 months. She gets OOB walking from room to room at home, and can not walk through  the grocery store any longer.   rec Only use your albuterol(proventil) as a rescue medication to be used if you can't catch your breath by resting or doing a relaxed purse lip breathing pattern. The less you use it, the better it will work when you need it. Ok to use up to 2 puffs every 4 hours but goal is less than twice weekly Work on inhaler technique    10/10/2012 f/u ov/Alexandria [name] re COPD GOLD II  Chief Complaint  Patient presents with  . 6 wk follow up    with PFTs.  Breahting is unchanged.    = doe across parking lot, no longer doing groceries at all Not clear what she's doing for rescue inhaler ? combivent ? Helping some but not sure    No obvious daytime variabilty or assoc chronic cough or cp or chest tightness, subjective wheeze overt sinus or hb symptoms. No unusual exp hx or h/o childhood pna/ asthma or knowledge of premature birth.  Has not tried saba to see if helps  Sleeping ok without nocturnal  or early am exacerbation  of respiratory  c/o's or need for noct saba. Also denies any obvious fluctuation of symptoms with weather or environmental changes or other aggravating or alleviating factors except as outlined above    Current Medications, Allergies,  Past Medical History, Past Surgical History, Family History, and Social History were reviewed in [employer] record.  ROS  The following are not active complaints unless bolded sore throat, dysphagia, dental problems, itching, sneezing,  nasal congestion or excess/ purulent secretions, ear ache,   fever, chills, sweats, unintended wt loss, pleuritic or exertional cp, hemoptysis,  orthopnea pnd or leg swelling, presyncope, palpitations, heartburn, abdominal pain, anorexia, nausea, vomiting, diarrhea  or change in bowel or urinary habits, change in stools or urine, dysuria,hematuria,  rash, arthralgias, visual complaints, headache, numbness weakness or ataxia or problems with walking or coordination,  change in  mood/affect or memory.            Past Medical History:  Depression (12/07/1998)  Hypothyroidism (03/08/1968)  Hypertension (03/08/1992)  Hyperlipidemia (10/04/2000)  Diabetes mellitus, type II (02/05/2006)- Dr. Leslie Dales with endo  COPD (03/08/1998)  - PFT's 12/12/2002 FEV1 1.42 (64%) ratio 58 with no better after B2 and DLCO75%  - PFT's 9/ 15/11 FEV1 1.50 (73%) ratio 50 no better after B2 with DLC0 62%  - hfa 50% November 20, 2009 > 75% January 13, 2010 p coaching >  75% 10/02/2010  VENTRAL HERNIA (ICD-553.20)  ESOPHAGITIS (ICD-530.10)  EDEMA (ICD-782.3)  SOB (ICD-786.05)  ALLERGY (ICD-995.3)  BRONCHITIS, OBSTRUCTIVE CHRONIC W/EXACRB (ICD-491.21)  ROTATOR CUFF SYNDROME, RIGHT (ICD-726.10)  SYNDROME, ROTATOR CUFF NOS (ICD-726.10)  OBESITY NOS (ICD-278.00)  NUMBNESS (ICD-782.0)  POSTMENOPAUSAL ON HORMONE REPLACEMENT THERAPY (ICD-V07.4)  Complex med regimen , med calendar 10/19/10     Family History:  Father dec 75 Emphysema One lung  Mother dec 93 Heart Thyrroid Dz  Brother A 11 Unknown  Brother A 66 Osteo Hyperthyr  Brother A 62 BACK Probs  Sister A 60 Breast Cysts Fibro Back Probs Hypothyr  no esophageal cancer  No known head and neck cancers.  no premature ascvd    Social History:  married, 2 children, she is retired, former smoker, drinks 2 alcoholic beverages per week, drinks 4 caffeinated beverages per day. Enjoys painting.  Former smoker. Quit on July 9th 2011. She smoked 40 yrs up to 1 ppd           Objective:   Physical Exam  Elderly amb wf NAD more hopeful attitude   wt 229 September 16, 2009 >  >235 10/19/10 >04/15/2011  234 >07/16/2011 226 > 232 08/24/2012 > 10/10/2012 234    HEENT mild turbinate edema.  Oropharynx no thrush or excess pnd or cobblestoning.  No JVD or cervical adenopathy. Mild accessory muscle hypertrophy. Trachea midline, nl thryroid. Chest was hyperinflated by percussion with diminished breath sounds and moderate increased exp time without  wheeze. Hoover sign positive at mid inspiration. Regular rate and rhythm without murmur gallop or rub or increase P2 or edema.  Abd: no hsm, nl excursion. Ext warm without cyanosis or clubbing.     CXR  10/10/2012 :   No active disease. Mild hyperinflation again noted.         Assessment:

## 2012-10-10 NOTE — Patient Instructions (Addendum)
Continue symbicort 160 Take 2 puffs first thing in am and then another 2 puffs about 12 hours later.   Please schedule a follow up visit in 3 months but call sooner if needed with all inhalers in hand.

## 2012-10-12 NOTE — Assessment & Plan Note (Addendum)
-   PFT's 12/12/2002 FEV1 1.42 (64%) ratio 58 with no better after B2 and DLCO 75%  - PFT's 9/ 15/11 FEV1 1.50 (73%) ratio 50 no better after B2 with DLC0 62%  - PFT's 10/10/2012   1.29 (61%) ratio 57 and no better p B2, DLCO  67% and corrects to 81  >>referred to pulmonary rehab 10/19/2010 > some better doe - Alpha one AT genotype 04/15/2011 > MM -   08/24/2012  Walked RA x 1 laps @ 185 ft each stopped due to feet hurt > sob s desat  The proper method of use, as well as anticipated side effects, of a metered-dose inhaler are discussed and demonstrated to the patient. Improved effectiveness after extensive coaching during this visit to a level of approximately  90% from baseline of < 50%.  I had an extended discussion with the patient today lasting 15 to 20 minutes of a 25 minute visit on the following issues:  Reviewed all pft's and ex studies above Each maintenance medication was reviewed in detail including most importantly the difference between maintenance and as needed and under what circumstances the prns are to be used.  Please see instructions for details which were reviewed in writing and the patient given a copy.    Need to get a better a handle on her prn meds, asked her to bring all inhalers with her.

## 2012-10-12 NOTE — Progress Notes (Signed)
Quick Note:  Spoke with pt and notified of results per Dr. Wert. Pt verbalized understanding and denied any questions.  ______ 

## 2012-10-17 ENCOUNTER — Encounter: Payer: Self-pay | Admitting: Internal Medicine

## 2012-10-25 ENCOUNTER — Other Ambulatory Visit: Payer: Self-pay | Admitting: Family Medicine

## 2013-01-02 ENCOUNTER — Other Ambulatory Visit: Payer: Self-pay | Admitting: *Deleted

## 2013-01-02 DIAGNOSIS — E559 Vitamin D deficiency, unspecified: Secondary | ICD-10-CM | POA: Diagnosis not present

## 2013-01-02 DIAGNOSIS — E1149 Type 2 diabetes mellitus with other diabetic neurological complication: Secondary | ICD-10-CM | POA: Diagnosis not present

## 2013-01-02 DIAGNOSIS — E782 Mixed hyperlipidemia: Secondary | ICD-10-CM | POA: Diagnosis not present

## 2013-01-02 DIAGNOSIS — I10 Essential (primary) hypertension: Secondary | ICD-10-CM | POA: Diagnosis not present

## 2013-01-02 NOTE — Telephone Encounter (Signed)
Faxed refill request. Please advise.  

## 2013-01-03 ENCOUNTER — Encounter: Payer: Self-pay | Admitting: Family Medicine

## 2013-01-03 MED ORDER — ALPRAZOLAM 0.5 MG PO TABS: 0.2500 mg | ORAL_TABLET | Freq: Two times a day (BID) | ORAL | Status: DC

## 2013-01-03 NOTE — Telephone Encounter (Signed)
Printed, please schedule a physical.  Thanks.

## 2013-01-03 NOTE — Telephone Encounter (Signed)
Left message on voicemail to call back. Rx faxed to pharmacy.

## 2013-01-04 ENCOUNTER — Encounter: Payer: Self-pay | Admitting: *Deleted

## 2013-01-04 DIAGNOSIS — E039 Hypothyroidism, unspecified: Secondary | ICD-10-CM | POA: Diagnosis not present

## 2013-01-04 DIAGNOSIS — E538 Deficiency of other specified B group vitamins: Secondary | ICD-10-CM | POA: Diagnosis not present

## 2013-01-04 DIAGNOSIS — E1142 Type 2 diabetes mellitus with diabetic polyneuropathy: Secondary | ICD-10-CM | POA: Diagnosis not present

## 2013-01-04 DIAGNOSIS — E1149 Type 2 diabetes mellitus with other diabetic neurological complication: Secondary | ICD-10-CM | POA: Diagnosis not present

## 2013-01-04 DIAGNOSIS — E559 Vitamin D deficiency, unspecified: Secondary | ICD-10-CM | POA: Diagnosis not present

## 2013-01-04 DIAGNOSIS — E782 Mixed hyperlipidemia: Secondary | ICD-10-CM | POA: Diagnosis not present

## 2013-01-04 DIAGNOSIS — I10 Essential (primary) hypertension: Secondary | ICD-10-CM | POA: Diagnosis not present

## 2013-01-04 NOTE — Telephone Encounter (Signed)
Left detailed message on voicemail.  

## 2013-01-09 ENCOUNTER — Ambulatory Visit: Payer: BC Managed Care – PPO | Admitting: Internal Medicine

## 2013-01-10 ENCOUNTER — Encounter: Payer: Self-pay | Admitting: Internal Medicine

## 2013-01-10 ENCOUNTER — Ambulatory Visit (INDEPENDENT_AMBULATORY_CARE_PROVIDER_SITE_OTHER): Payer: BC Managed Care – PPO | Admitting: Internal Medicine

## 2013-01-10 VITALS — BP 122/60 | HR 83 | Temp 98.1°F | Ht 64.0 in | Wt 229.0 lb

## 2013-01-10 DIAGNOSIS — J449 Chronic obstructive pulmonary disease, unspecified: Secondary | ICD-10-CM | POA: Diagnosis not present

## 2013-01-10 MED ORDER — BUDESONIDE-FORMOTEROL FUMARATE 160-4.5 MCG/ACT IN AERO: INHALATION_SPRAY | RESPIRATORY_TRACT | Status: DC

## 2013-01-10 NOTE — Patient Instructions (Addendum)
Plan A= Automatic = Symbicort Take 2 puffs first thing in am and then another 2 puffs about 12 hours later. (we did in a year's supply today)    Plan B = Backup = Only use your albuterol as a rescue medication to be used if you can't catch your breath by resting or doing a relaxed purse lip breathing pattern.  - The less you use it, the better it will work when you need it. - Ok to use up to every 4 hours if you must but call for immediate appointment if use goes up over your usual need - Don't leave home without it !!  (think of it like your spare tire for your car)    If you are satisfied with your treatment plan let your doctor know and he/she can either refill your medications or you can return here when your prescription runs out.     If in any way you are not 100% satisfied,  please tell us.  If 100% better, tell your friends!   Pulmonary follow up is as needed

## 2013-01-10 NOTE — Progress Notes (Signed)
Subjective:     Patient ID: Alexandria [name], female   DOB: [birth year]    MRN: [id number]   Brief patient profile:  77  yowf quit smoking Dec 2006 with GOLD II COPD FEV1 1.42 (64%) in 2004   HPI September 16, 2009 1st pulmonary office eval in em[place] She c/o worsening SOB for the past several yrs. She gets out of breath walking up stairs and walking flat surface approx 500 ft. She states that hot/humid weather makes breathing worse. supposed to be taking spiriva and advair but not consistent. better after proaire though hfa technique margnial. Notes mild reflux/ dysphagia/ hoarseness also rec .stop advair  Symbicort 2 puffs first thing in am and 2 puffs again in pm about 12 hours later  Take spiriva after symbicort  if needed proaire 2 puffs every 4 hours with goal of needing it less than twice daily  Nexium Take one 30-60 min before first meal of the day      11/24/2010 f/u ov/Trinidad Petron cc breathing no better and using excess saba but no increase cough or purulent secretions or overt HB on present complex regimen which appears to be following per med calendar rec Work on inhaler technique:  GERD diet Only use proair/combent  if can't catch  your breath at rest. Prednisone x 6 day taper off  Try Combivent 2 puffs every 4 hours in place of proaire should work better and last longer    04/15/2011 f/u ov/Reece Fehnel  Did not bring med calendar, not finding it useful despite complex rx, cc  breathing has improved since being in pulm rehab. No cough now that off dpi. Still needs HC parking with large events like concerts. rec Avelox 400mg  daily for 7 days  Mucinex DM Twice daily As needed  Cough/congestion  Prednisone taper over next week.  Fluids and rest  I will call with xray results.    08/24/2012 f/u ov/Jewelz Ricklefs re GOLD II COPD  Chief Complaint  Patient presents with  . Acute Visit    Pt c/o DOE gradually worsening for the past 6 months. She gets OOB walking from room to room at home, and can not walk through  the grocery store any longer.   rec Only use your albuterol(proventil) as a rescue medication to be used if you can't catch your breath by resting or doing a relaxed purse lip breathing pattern. The less you use it, the better it will work when you need it. Ok to use up to 2 puffs every 4 hours but goal is less than twice weekly Work on inhaler technique    10/10/2012 f/u ov/Briseis Aguilera re COPD GOLD II  Chief Complaint  Patient presents with  . 6 wk follow up    with PFTs.  Breahting is unchanged.    = doe across parking lot, no longer doing groceries at all Not clear what she's doing for rescue inhaler ? combivent ? Helping some but not sure  rec Continue symbicort 160 Take 2 puffs first thing in am and then another 2 puffs about 12 hours later.  Please schedule a follow up visit in 3 months but call sooner if needed with all inhalers in hand.   01/10/2013 f/u ov/Vernida Mcnicholas re: GOLD II COPD/ did not bring all active inhalers  Chief Complaint  Patient presents with  . Follow-up    Pt states that her breathing seems to be doing better. Has not used proair since her last visit.     Not limited from  desired adls, overall very happy with just the symbicort and less throat irritation off spiriva   No obvious daytime variabilty or assoc chronic cough or cp or chest tightness, subjective wheeze overt sinus or hb symptoms. No unusual exp hx or h/o childhood pna/ asthma or knowledge of premature birth.  Has not tried saba to see if helps  Sleeping ok without nocturnal  or early am exacerbation  of respiratory  c/o's or need for noct saba. Also denies any obvious fluctuation of symptoms with weather or environmental changes or other aggravating or alleviating factors except as outlined above    Current Medications, Allergies, Past Medical History, Past Surgical History, Family History, and Social History were reviewed in [employer] record.  ROS  The following are not active complaints  unless bolded sore throat, dysphagia, dental problems, itching, sneezing,  nasal congestion or excess/ purulent secretions, ear ache,   fever, chills, sweats, unintended wt loss, pleuritic or exertional cp, hemoptysis,  orthopnea pnd or leg swelling, presyncope, palpitations, heartburn, abdominal pain, anorexia, nausea, vomiting, diarrhea  or change in bowel or urinary habits, change in stools or urine, dysuria,hematuria,  rash, arthralgias, visual complaints, headache, numbness weakness or ataxia or problems with walking or coordination,  change in mood/affect or memory.            Past Medical History:  Depression (12/07/1998)  Hypothyroidism (03/08/1968)  Hypertension (03/08/1992)  Hyperlipidemia (10/04/2000)  Diabetes mellitus, type II (02/05/2006)- Dr. Leslie Dales with endo  COPD (03/08/1998)  - PFT's 12/12/2002 FEV1 1.42 (64%) ratio 58 with no better after B2 and DLCO75%  - PFT's 9/ 15/11 FEV1 1.50 (73%) ratio 50 no better after B2 with DLC0 62%  - hfa 50% November 20, 2009 > 75% January 13, 2010 p coaching >  75% 10/02/2010  VENTRAL HERNIA (ICD-553.20)  ESOPHAGITIS (ICD-530.10)  EDEMA (ICD-782.3)  SOB (ICD-786.05)  ALLERGY (ICD-995.3)  BRONCHITIS, OBSTRUCTIVE CHRONIC W/EXACRB (ICD-491.21)  ROTATOR CUFF SYNDROME, RIGHT (ICD-726.10)  SYNDROME, ROTATOR CUFF NOS (ICD-726.10)  OBESITY NOS (ICD-278.00)  NUMBNESS (ICD-782.0)  POSTMENOPAUSAL ON HORMONE REPLACEMENT THERAPY (ICD-V07.4)  Complex med regimen , med calendar 10/19/10     Family History:  Father dec 75 Emphysema One lung  Mother dec 93 Heart Thyrroid Dz  Brother A 60 Unknown  Brother A 66 Osteo Hyperthyr  Brother A 62 BACK Probs  Sister A 60 Breast Cysts Fibro Back Probs Hypothyr  no esophageal cancer  No known head and neck cancers.  no premature ascvd    Social History:  married, 2 children, she is retired, former smoker, drinks 2 alcoholic beverages per week, drinks 4 caffeinated beverages per day. Enjoys  painting.  Former smoker. Quit on July 9th 2011. She smoked 40 yrs up to 1 ppd           Objective:   Physical Exam  Elderly amb wf NAD more hopeful attitude   wt 229 September 16, 2009 >  >235 10/19/10 >04/15/2011  234 >07/16/2011 226 > 232 08/24/2012 > 10/10/2012 234 > 01/10/2013 229    HEENT mild turbinate edema.  Oropharynx no thrush or excess pnd or cobblestoning.  No JVD or cervical adenopathy. Mild accessory muscle hypertrophy. Trachea midline, nl thryroid. Chest was hyperinflated by percussion with diminished breath sounds and mild  increased exp time without wheeze. Hoover sign positive at late inspiration. Regular rate and rhythm without murmur gallop or rub or increase P2 or edema.  Abd: no hsm, nl excursion. Ext warm without cyanosis or clubbing.  CXR  10/10/2012 :   No active disease. Mild hyperinflation again noted.         Assessment:

## 2013-01-10 NOTE — Assessment & Plan Note (Signed)
Not clear whether this was reflux or side effect of dpi but has resolved at this point off dpi (which isn't needed anyway at this point ) and on aggressive acid rx, which probably is.  Pulmonary f/u is therefore prn

## 2013-01-10 NOTE — Assessment & Plan Note (Addendum)
PFT's 12/12/2002 FEV1 1.42 (64%) ratio 58 with no better after B2 and DLCO 75%  - PFT's 9/ 15/11 FEV1 1.50 (73%) ratio 50 no better after B2 with DLC0 62%  - PFT's 10/10/2012   1.29 (61%) ratio 57 and no better p B2, DLCO  67% and corrects to 81  >>referred to pulmonary rehab 10/19/2010 > some better doe - Alpha one AT genotype 04/15/2011 > MM -   08/24/2012  Walked RA x 1 laps @ 185 ft each stopped due to feet hurt > sob s desat   The proper method of use, as well as anticipated side effects, of a metered-dose inhaler are discussed and demonstrated to the patient. Improved effectiveness after extensive coaching during this visit to a level of approximately  75% from previous high of 90%   I had an extended discussion with the patient today lasting 15 to 20 minutes of a 25 minute visit on the following issues:   She has not activities that limit her breathing, no cough or need for rescue rx using just symbicort 160 2 bid effectively though note she looses a bit of the technique between ov's  However, at this point the main challenge is wt loss and keeping up with refills so pulmonary f/u is prn.  Late add: Requested review of last a/p for copd by oral surgery for conscious sedation purposes and don't see a contraindication as long as sedation is kept at the lowest level tolerable

## 2013-01-24 DIAGNOSIS — H2510 Age-related nuclear cataract, unspecified eye: Secondary | ICD-10-CM | POA: Diagnosis not present

## 2013-01-24 DIAGNOSIS — H52209 Unspecified astigmatism, unspecified eye: Secondary | ICD-10-CM | POA: Diagnosis not present

## 2013-01-24 DIAGNOSIS — E119 Type 2 diabetes mellitus without complications: Secondary | ICD-10-CM | POA: Diagnosis not present

## 2013-01-24 LAB — HM DIABETES EYE EXAM: HM Diabetic Eye Exam: NOT DETECTED

## 2013-02-06 ENCOUNTER — Encounter: Payer: Self-pay | Admitting: Family Medicine

## 2013-02-22 ENCOUNTER — Other Ambulatory Visit: Payer: Self-pay

## 2013-02-22 MED ORDER — FLUTICASONE PROPIONATE 50 MCG/ACT NA SUSP: NASAL | Status: DC

## 2013-02-22 NOTE — Telephone Encounter (Signed)
Pt said insurance requires [facility]; pt will cb for appt with Dr Para March.

## 2013-03-12 ENCOUNTER — Ambulatory Visit (INDEPENDENT_AMBULATORY_CARE_PROVIDER_SITE_OTHER): Payer: BC Managed Care – PPO | Admitting: Internal Medicine

## 2013-03-12 ENCOUNTER — Encounter: Payer: Self-pay | Admitting: Internal Medicine

## 2013-03-12 VITALS — BP 128/66 | HR 92 | Temp 97.9°F | Wt 225.5 lb

## 2013-03-12 DIAGNOSIS — I10 Essential (primary) hypertension: Secondary | ICD-10-CM

## 2013-03-12 DIAGNOSIS — J449 Chronic obstructive pulmonary disease, unspecified: Secondary | ICD-10-CM

## 2013-03-12 DIAGNOSIS — E785 Hyperlipidemia, unspecified: Secondary | ICD-10-CM

## 2013-03-12 DIAGNOSIS — G4733 Obstructive sleep apnea (adult) (pediatric): Secondary | ICD-10-CM

## 2013-03-12 DIAGNOSIS — Z01810 Encounter for preprocedural cardiovascular examination: Secondary | ICD-10-CM

## 2013-03-12 DIAGNOSIS — E1149 Type 2 diabetes mellitus with other diabetic neurological complication: Secondary | ICD-10-CM | POA: Diagnosis not present

## 2013-03-12 DIAGNOSIS — R011 Cardiac murmur, unspecified: Secondary | ICD-10-CM

## 2013-03-12 NOTE — Progress Notes (Signed)
Pre-visit discussion using our clinic review tool. No additional management support is needed unless otherwise documented below in the visit note.  

## 2013-03-12 NOTE — Assessment & Plan Note (Signed)
Wears CPAP Make sure to take this to the hospital with you for after your surgery.

## 2013-03-12 NOTE — Assessment & Plan Note (Signed)
Well controlled EKG obtained today, compared to EKG from 12/12- no changes Continue current therapy

## 2013-03-12 NOTE — Assessment & Plan Note (Signed)
Asymptomatic Will continue to monitor

## 2013-03-12 NOTE — Progress Notes (Signed)
Subjective:    Patient ID: Alexandria [name], female    DOB: 17-Apr-1935, 78 y.o.   MRN: [id number]  HPI  Pt presents to the clinic today for her annual screening. She is thinking about having plastic surgery. She would like chin implants. She meets with the surgeon this week. She has no concerns today.  Flu: 11/2012 Tetanus: 2012 Pneumovax: 2010 Zostovax: 2007 Mammogram: 12/2012 Pap Smear: unsure of last one Bone Density: unsure Colonoscopy: never-declines Eye Doctor: yearly Dentist: biannually  Review of Systems      Past Medical History  Diagnosis Date  . Depression 12/07/1998  . Hypothyroidism 03/08/1968  . Hypertension 03/08/1992  . Hyperlipidemia 10/04/2000  . Diabetes mellitus type II 02/05/2006    Dr. Elyse Hsu with endo  . COPD (chronic obstructive pulmonary disease) 03/08/1998    PFTs 12/12/2002 FEV 1 1.42 (64%) ratio 58 with no better after B2 and DLCO75%; PFTs 11/20/09 FEV1 1.50 (73%) ratio 50 no better after B2 with DLCO 62%; Hfa 50% 11/20/2009 >75%, 01/13/10 p coaching  . Ventral hernia   . Esophagitis   . Allergy, unspecified not elsewhere classified   . Disorders of bursae and tendons in shoulder region, unspecified     Rotator cuff syndrome, right  . Obesity     NOS  . Peripheral neuropathy     Likely due to DM per Dr. Erling Cruz  . OSA (obstructive sleep apnea)     PSG 01/27/10 AHI 13, pt does not know CPAP settings  . GERD (gastroesophageal reflux disease)     Current Outpatient Prescriptions  Medication Sig Dispense Refill  . albuterol (PROAIR HFA) 108 (90 BASE) MCG/ACT inhaler Inhale 2 puffs into the lungs every 6 (six) hours as needed for wheezing or shortness of breath.      . ALPRAZolam (XANAX) 0.5 MG tablet Take 0.5 tablets (0.25 mg total) by mouth 2 (two) times daily. As needed for anxiety  90 tablet  1  . aspirin 81 MG EC tablet Take 81 mg by mouth daily.        . budesonide-formoterol (SYMBICORT) 160-4.5 MCG/ACT inhaler INHALE 2 PUFFS FIRST THING IN  THE MORNING AND 2 PUFFS AGAIN IN THE EVENING ABOUT 12 HOURS LATER  3 Inhaler  3  . Coenzyme Q-10 200 MG CAPS Take 1 capsule by mouth daily.        . Cyanocobalamin (B-12 PO) Take 1 tablet by mouth daily.      Marland Kitchen EPINEPHrine (EPIPEN) 0.3 mg/0.3 mL DEVI Inject 0.3 mg into the muscle as directed.       . ergocalciferol (VITAMIN D2) 50000 UNITS capsule Take 50,000 Units by mouth once a week.        . ezetimibe (ZETIA) 10 MG tablet Take 1 tablet (10 mg total) by mouth at bedtime.      . famotidine (PEPCID) 20 MG tablet Take 20 mg by mouth at bedtime.       Marland Kitchen FLUoxetine (PROZAC) 10 MG capsule TAKE 2 CAPSULES IN THE MORNING AND TAKE 2 CAPSULES IN THE EVENING  360 capsule  3  . fluticasone (FLONASE) 50 MCG/ACT nasal spray USE 2 SPRAYS INTO THE NOSE EVERY 12 HOURS AS NEEDED FOR STUFFY NOSE  48 g  1  . L-Methylfolate-Algae-B12-B6 (METANX) 3-90.314-2-35 MG CAPS Take 1 capsule by mouth 2 (two) times daily.      Marland Kitchen levothyroxine (SYNTHROID, LEVOTHROID) 150 MCG tablet Take 150 mcg by mouth daily before breakfast. Can not take generic      .  NEXIUM 40 MG capsule TAKE 1 CAPSULE DAILY  90 capsule  2  . NON FORMULARY CPAP      . rosuvastatin (CRESTOR) 10 MG tablet Take 10 mg by mouth at bedtime.        . sitaGLIPtin (JANUVIA) 100 MG tablet Take 1 tablet (100 mg total) by mouth daily.      . valsartan (DIOVAN) 160 MG tablet Take 160 mg by mouth daily before breakfast.       No current facility-administered medications for this visit.    Allergies  Allergen Reactions  . Clarithromycin     REACTION: diff swallowing and mouth blisters  . Codeine Other (See Comments)    Unknown   . Sulfonamide Derivatives     REACTION: closed throat    Family History  Problem Relation Age of Onset  . Heart disease Mother   . Thyroid disease Mother   . Emphysema Father     One lung  . Breast cancer Sister   . Cystic fibrosis Sister   . Hyperthyroidism Sister   . Esophageal cancer Neg Hx   . Cancer Neg Hx     Head or  neck  . Osteoarthritis Brother   . Hyperthyroidism Brother     History   Social History  . Marital Status: Married    Spouse Name: N/A    Number of Children: N/A  . Years of Education: N/A   Occupational History  . Retired    Social History Main Topics  . Smoking status: Former Smoker -- 2.00 packs/day for 50 years    Types: Cigarettes    Quit date: 02/05/2006  . Smokeless tobacco: Never Used  . Alcohol Use: 1.0 oz/week    2 drink(s) per week     Comment: occasional  . Drug Use: No  . Sexual Activity: Not on file   Other Topics Concern  . Not on file   Social History Narrative   Married with 2 children   Drinks 4 caffeinated beverages per day   Enjoys painting     Constitutional: Denies fever, malaise, fatigue, headache or abrupt weight changes.  HEENT: Denies eye pain, eye redness, ear pain, ringing in the ears, wax buildup, runny nose, nasal congestion, bloody nose, or sore throat. Respiratory: Pt reports occasional shortness of breath. Denies difficulty breathing, cough or sputum production.   Cardiovascular: Denies chest pain, chest tightness, palpitations or swelling in the hands or feet.  Gastrointestinal: Denies abdominal pain, bloating, constipation, diarrhea or blood in the stool.  GU: Pt reports frequency. Denies urgency, pain with urination, burning sensation, blood in urine, odor or discharge. Musculoskeletal: Pt reports bilateral thumb pain. Denies decrease in range of motion, difficulty with gait, muscle pain or joint swelling.  Skin: Denies redness, rashes, lesions or ulcercations.  Neurological: Denies dizziness, difficulty with memory, difficulty with speech or problems with balance and coordination.   No other specific complaints in a complete review of systems (except as listed in HPI above).  Objective:   Physical Exam   BP 128/66  Pulse 92  Temp(Src) 97.9 F (36.6 C) (Oral)  Wt 225 lb 8 oz (102.286 kg)  SpO2 97% Wt Readings from Last 3  Encounters:  03/12/13 225 lb 8 oz (102.286 kg)  01/10/13 229 lb (103.874 kg)  10/10/12 234 lb (106.142 kg)    General: Appears her stated age, obese but well developed, well nourished in NAD. Skin: Warm, dry and intact. No rashes, lesions or ulcerations noted. HEENT: Head: normal  shape and size; Eyes: sclera white, no icterus, conjunctiva pink, PERRLA and EOMs intact; Ears: Tm's gray and intact, normal light reflex; Nose: mucosa pink and moist, septum midline; Throat/Mouth: Teeth present, mucosa pink and moist, no exudate, lesions or ulcerations noted.  Neck: Normal range of motion. Neck supple, trachea midline. No massses, lumps or thyromegaly present.  Cardiovascular: Normal rate and rhythm. S1,S2 noted. Murmur noted.  No  rubs or gallops noted. No JVD or BLE edema. No carotid bruits noted. Pulmonary/Chest: Normal effort and positive vesicular breath sounds. Intermittent expiratory wheeze noted.  No respiratory distress. No rales or ronchi noted.  Abdomen: Soft and nontender. Normal bowel sounds, no bruits noted. No distention or masses noted. Liver, spleen and kidneys non palpable. Musculoskeletal: Normal range of motion. No signs of joint swelling. No difficulty with gait.  Neurological: Alert and oriented. Cranial nerves II-XII intact. Coordination normal. +DTRs bilaterally. Psychiatric: Mood and affect normal. Behavior is normal. Judgment and thought content normal.    BMET    Component Value Date/Time   NA 139 08/24/2012 1621   K 4.4 08/24/2012 1621   CL 102 08/24/2012 1621   CO2 28 08/24/2012 1621   GLUCOSE 164* 08/24/2012 1621   BUN 21 08/24/2012 1621   CREATININE 1.0 08/24/2012 1621   CALCIUM 9.6 08/24/2012 1621   GFRNONAA 67* 01/31/2012 1140   GFRAA 77* 01/31/2012 1140    Lipid Panel     Component Value Date/Time   CHOL 126 02/19/2010   TRIG 77 02/19/2010   HDL 66 02/19/2010   CHOLHDL 4.7 CALC 01/12/2007 1025   VLDL 25 01/12/2007 1025   LDLCALC 45 02/19/2010    CBC      Component Value Date/Time   WBC 8.7 08/24/2012 1621   RBC 4.35 08/24/2012 1621   HGB 11.3* 08/24/2012 1621   HCT 35.0* 08/24/2012 1621   PLT 256.0 08/24/2012 1621   MCV 80.3 08/24/2012 1621   MCH 26.5 01/31/2012 1140   MCHC 32.3 08/24/2012 1621   RDW 17.0* 08/24/2012 1621   LYMPHSABS 2.1 08/24/2012 1621   MONOABS 0.5 08/24/2012 1621   EOSABS 0.1 08/24/2012 1621   BASOSABS 0.0 08/24/2012 1621    Hgb A1C Lab Results  Component Value Date   HGBA1C 6.3 02/19/2010        Assessment & Plan:

## 2013-03-12 NOTE — Assessment & Plan Note (Signed)
Will recheck lipid profile today

## 2013-03-12 NOTE — Assessment & Plan Note (Signed)
I can not see where she has had a recent A1C done Will repeat labs today

## 2013-03-12 NOTE — Assessment & Plan Note (Signed)
Controlled on current therapy She was told by Dr. Melvyn Novas that we would be able to fill her pulm medications and she would only need to see him if her symptoms worsened

## 2013-03-12 NOTE — Patient Instructions (Signed)

## 2013-03-14 ENCOUNTER — Telehealth: Payer: Self-pay

## 2013-03-14 NOTE — Telephone Encounter (Signed)
Pt called in wanting to know if you sent the surgical clearance info to Dr Junita Push? As she has an appt tomorrow. There number is [phone]--please advise

## 2013-03-14 NOTE — Telephone Encounter (Signed)
They usually fax me a form to sign. She needs to request that they send [place] the form for surgical clearance

## 2013-03-14 NOTE — Telephone Encounter (Signed)
I called the office and asked the to fax the form. I will bring the form to you once it is received

## 2013-03-20 ENCOUNTER — Ambulatory Visit: Payer: BC Managed Care – PPO | Admitting: Family Medicine

## 2013-03-23 ENCOUNTER — Other Ambulatory Visit: Payer: Self-pay | Admitting: *Deleted

## 2013-03-23 MED ORDER — BUDESONIDE-FORMOTEROL FUMARATE 160-4.5 MCG/ACT IN AERO: INHALATION_SPRAY | RESPIRATORY_TRACT | Status: DC

## 2013-03-23 MED ORDER — ROSUVASTATIN CALCIUM 10 MG PO TABS: 10.0000 mg | ORAL_TABLET | Freq: Every day | ORAL | Status: DC

## 2013-03-23 NOTE — Telephone Encounter (Signed)
Patient is asking for 3 month supply sent to new Mail Order Pharmacy:  [facility]  Symbicort and Crestor already sent.  Please advise on Alprazolam.  Rx set to print and I will fax if authorized.

## 2013-03-25 NOTE — Telephone Encounter (Signed)
She should have another refill left from the rx done in 12/2012.  I wouldn't send again.  Thanks.

## 2013-03-26 MED ORDER — ALPRAZOLAM 0.5 MG PO TABS: 0.2500 mg | ORAL_TABLET | Freq: Two times a day (BID) | ORAL | Status: DC

## 2013-03-26 NOTE — Telephone Encounter (Signed)
I understand but this is a new pharmacy.  Does that make any difference?

## 2013-03-26 NOTE — Telephone Encounter (Signed)
That would make a difference.  I didn't understand initially.  Please fax in after I sign.  Thanks.

## 2013-03-27 NOTE — Telephone Encounter (Signed)
Faxed

## 2013-04-06 ENCOUNTER — Telehealth: Payer: Self-pay | Admitting: Family Medicine

## 2013-04-06 NOTE — Telephone Encounter (Signed)
Relevant patient education assigned to patient using Emmi. ° °

## 2013-04-09 ENCOUNTER — Telehealth: Payer: Self-pay | Admitting: Family Medicine

## 2013-04-09 NOTE — Telephone Encounter (Signed)
Relevant patient education assigned to patient using Emmi. ° °

## 2013-04-17 DIAGNOSIS — E1149 Type 2 diabetes mellitus with other diabetic neurological complication: Secondary | ICD-10-CM | POA: Diagnosis not present

## 2013-04-17 DIAGNOSIS — E039 Hypothyroidism, unspecified: Secondary | ICD-10-CM | POA: Diagnosis not present

## 2013-04-17 DIAGNOSIS — E782 Mixed hyperlipidemia: Secondary | ICD-10-CM | POA: Diagnosis not present

## 2013-04-17 DIAGNOSIS — I10 Essential (primary) hypertension: Secondary | ICD-10-CM | POA: Diagnosis not present

## 2013-06-13 ENCOUNTER — Other Ambulatory Visit: Payer: Self-pay | Admitting: Family Medicine

## 2013-06-13 MED ORDER — FLUTICASONE PROPIONATE 50 MCG/ACT NA SUSP: NASAL | Status: DC

## 2013-06-13 NOTE — Telephone Encounter (Signed)
Pt is needing refill on Flutizasonepropionate Nasal spray. Pt says we needed to contact her insurance company to get it refilled. Pt says it should be a 3 month refill with it.

## 2013-06-13 NOTE — Telephone Encounter (Signed)
Spoke with pt; she is in a meeting and cannot talk now; pt left all information at initial call.Asked pt why our office needed to call ins co. Pt said wants to go to ins co mail order pharmacy for 3 month supply.Is it OK to refill fluticasone nasal spray?

## 2013-06-13 NOTE — Telephone Encounter (Signed)
Sent. Thanks.   

## 2013-06-19 ENCOUNTER — Other Ambulatory Visit: Payer: Self-pay | Admitting: Family Medicine

## 2013-06-19 MED ORDER — ROSUVASTATIN CALCIUM 10 MG PO TABS: 10.0000 mg | ORAL_TABLET | Freq: Every day | ORAL | Status: DC

## 2013-06-21 ENCOUNTER — Telehealth: Payer: Self-pay

## 2013-06-21 NOTE — Telephone Encounter (Signed)
Pt request refill fluoxetine to [facility].Please advise.

## 2013-06-22 MED ORDER — FLUOXETINE HCL 10 MG PO CAPS: ORAL_CAPSULE | ORAL | Status: DC

## 2013-06-22 NOTE — Telephone Encounter (Signed)
Have her set up with me for the fall, routine f/u appointment. Thanks.   rx sent.

## 2013-07-04 ENCOUNTER — Ambulatory Visit (INDEPENDENT_AMBULATORY_CARE_PROVIDER_SITE_OTHER): Payer: BC Managed Care – PPO | Admitting: Family Medicine

## 2013-07-04 ENCOUNTER — Encounter: Payer: Self-pay | Admitting: Family Medicine

## 2013-07-04 VITALS — BP 110/64 | HR 100 | Temp 99.0°F | Ht 64.0 in | Wt 231.5 lb

## 2013-07-04 DIAGNOSIS — J218 Acute bronchiolitis due to other specified organisms: Secondary | ICD-10-CM | POA: Diagnosis not present

## 2013-07-04 DIAGNOSIS — J209 Acute bronchitis, unspecified: Secondary | ICD-10-CM | POA: Insufficient documentation

## 2013-07-04 DIAGNOSIS — J219 Acute bronchiolitis, unspecified: Secondary | ICD-10-CM

## 2013-07-04 MED ORDER — PREDNISONE 10 MG PO TABS: ORAL_TABLET | ORAL | Status: DC

## 2013-07-04 MED ORDER — AMOXICILLIN-POT CLAVULANATE 875-125 MG PO TABS
1.0000 | ORAL_TABLET | Freq: Two times a day (BID) | ORAL | Status: DC
Start: 2013-07-04 — End: 2013-08-03

## 2013-07-04 NOTE — Progress Notes (Signed)
Pre visit review using our clinic review tool, if applicable. No additional management support is needed unless otherwise documented below in the visit note. 

## 2013-07-04 NOTE — Progress Notes (Signed)
Subjective:    Patient ID: Alexandria [name], female    DOB: 1935/07/19, 78 y.o.   MRN: [id number]  HPI Here with a cough and congestion  Rattling in her chest  Lost voice-coming back  Prod cough green sputum   Has hx of copd Has wheezed-improved today  Nasal congestion and runny nose also  A little headache-? Sinus  Low grade temp 99- does not feel like she has a fever (was taking some advil)  Symptoms started over the weekend   Pulse ox 96  Patient Active Problem List   Diagnosis Date Noted  . Pedal edema 09/15/2012  . Dyspnea 08/24/2012  . Shoulder pain 12/17/2011  . Abdominal hernia 12/17/2011  . Anxiety 09/23/2011  . Osteopenia 07/25/2011  . OBSTRUCTIVE SLEEP APNEA 02/09/2010  . MURMUR 02/04/2010  . CHEST PAIN 01/13/2010  . ONYCHOMYCOSIS, TOENAILS 06/30/2009  . LEG PAIN 06/30/2009  . PALPITATIONS 08/29/2008  . HOARSENESS 01/08/2008  . ESOPHAGEAL STENOSIS 11/22/2007  . DYSPHAGIA 11/08/2007  . ESOPHAGITIS 10/11/2007  . VENTRAL HERNIA 10/11/2007  . EDEMA 07/03/2007  . COPD GOLD II 05/23/2007  . ALLERGY 07/25/2006  . DIABETES MELLITUS, TYPE II, WITH NEUROLOGICAL COMPLICATIONS 86/57/8469  . NUMBNESS 10/06/2001  . HYPERLIPIDEMIA 10/04/2000  . DEPRESSION 03/08/1997  . HYPERTENSION 03/08/1992  . OBESITY NOS 03/08/1989  . ROTATOR CUFF SYNDROME, RIGHT 03/08/1982  . HYPOTHYROIDISM 03/08/1968   Past Medical History  Diagnosis Date  . Depression 12/07/1998  . Hypothyroidism 03/08/1968  . Hypertension 03/08/1992  . Hyperlipidemia 10/04/2000  . Diabetes mellitus type II 02/05/2006    Dr. Elyse Hsu with endo  . COPD (chronic obstructive pulmonary disease) 03/08/1998    PFTs 12/12/2002 FEV 1 1.42 (64%) ratio 58 with no better after B2 and DLCO75%; PFTs 11/20/09 FEV1 1.50 (73%) ratio 50 no better after B2 with DLCO 62%; Hfa 50% 11/20/2009 >75%, 01/13/10 p coaching  . Ventral hernia   . Esophagitis   . Allergy, unspecified not elsewhere classified   . Disorders of bursae  and tendons in shoulder region, unspecified     Rotator cuff syndrome, right  . Obesity     NOS  . Peripheral neuropathy     Likely due to DM per Dr. Erling Cruz  . OSA (obstructive sleep apnea)     PSG 01/27/10 AHI 13, pt does not know CPAP settings  . GERD (gastroesophageal reflux disease)    Past Surgical History  Procedure Laterality Date  . Tonsillectomy    . Rotator cuff repair  1984    Right, [place]  . Cesarean section      x2 Breech/ repeat  . Total abdominal hysterectomy  1985    Due to dysmennorhea  . Cholecystectomy  1997  . Thumb release  12/1997    Right  . Carpal tunnel release  12/1997    Right  . Esophagogastroduodenoscopy  12/05/1997    Nml (due to hoarseness)  . Abd u/s  03/19/1999    Nml x2 foci in liver  . Ct abd w & pelvis wo/w cm      Abd hemangiomas of liver, 1 cm R renal cyst  . Cardiolite persantine  08/24/2000    Nml  . Carotid u/s  08/24/2000    1-39% ICA stenosis  . Dexa  07/03/2003    Nml  . Adenosine myoview  06/02/2007    Nml  . Carotid u/s  06/02/2007    No apprec change   . US echocardiography  06/02/2007  . Hernia repair  01/24/2009    Lap Ventr w/ Lysis of adhesions (Dr. Donne Hazel)  . Knee arthroscopic surgery  years ago    right  . Shoulder open rotator cuff repair  02/08/2012    Procedure: ROTATOR CUFF REPAIR SHOULDER OPEN;  Surgeon: Magnus Sinning, MD;  Location: [facility];  Service: Orthopedics;  Laterality: Left;  Left Shoulder Open Anterior Acrominectomy Rotator Cuff Repair Open Distal Clavicle Resection ,tissue mend graft, and repair of biceps tendon   History  Substance Use Topics  . Smoking status: Former Smoker -- 2.00 packs/day for 50 years    Types: Cigarettes    Quit date: 02/05/2006  . Smokeless tobacco: Never Used  . Alcohol Use: 1.0 oz/week    2 drink(s) per week     Comment: occasional   Family History  Problem Relation Age of Onset  . Heart disease Mother   . Thyroid disease Mother   . Emphysema Father      One lung  . Breast cancer Sister   . Cystic fibrosis Sister   . Hyperthyroidism Sister   . Esophageal cancer Neg Hx   . Cancer Neg Hx     Head or neck  . Osteoarthritis Brother   . Hyperthyroidism Brother    Allergies  Allergen Reactions  . Clarithromycin     REACTION: diff swallowing and mouth blisters  . Codeine Other (See Comments)    Unknown   . Sulfonamide Derivatives     REACTION: closed throat   Current Outpatient Prescriptions on File Prior to Visit  Medication Sig Dispense Refill  . albuterol (PROAIR HFA) 108 (90 BASE) MCG/ACT inhaler Inhale 2 puffs into the lungs every 6 (six) hours as needed for wheezing or shortness of breath.      . ALPRAZolam (XANAX) 0.5 MG tablet Take 0.5 tablets (0.25 mg total) by mouth 2 (two) times daily. As needed for anxiety  90 tablet  1  . aspirin 81 MG EC tablet Take 81 mg by mouth daily.        . budesonide-formoterol (SYMBICORT) 160-4.5 MCG/ACT inhaler INHALE 2 PUFFS FIRST THING IN THE MORNING AND 2 PUFFS AGAIN IN THE EVENING ABOUT 12 HOURS LATER  3 Inhaler  3  . Coenzyme Q-10 200 MG CAPS Take 1 capsule by mouth daily.        . Cyanocobalamin (B-12 PO) Take 1 tablet by mouth daily.      Marland Kitchen EPINEPHrine (EPIPEN) 0.3 mg/0.3 mL DEVI Inject 0.3 mg into the muscle as directed.       . ergocalciferol (VITAMIN D2) 50000 UNITS capsule Take 50,000 Units by mouth once a week.        . ezetimibe (ZETIA) 10 MG tablet Take 1 tablet (10 mg total) by mouth at bedtime.      . famotidine (PEPCID) 20 MG tablet Take 20 mg by mouth at bedtime.       Marland Kitchen FLUoxetine (PROZAC) 10 MG capsule TAKE 2 CAPSULES IN THE MORNING AND TAKE 2 CAPSULES IN THE EVENING  360 capsule  3  . fluticasone (FLONASE) 50 MCG/ACT nasal spray USE 2 SPRAYS INTO THE NOSE EVERY 12 HOURS AS NEEDED FOR STUFFY NOSE  48 g  1  . L-Methylfolate-Algae-B12-B6 (METANX) 3-90.314-2-35 MG CAPS Take 1 capsule by mouth 2 (two) times daily.      Marland Kitchen levothyroxine (SYNTHROID, LEVOTHROID) 150 MCG tablet Take 150  mcg by mouth daily before breakfast. Can not take generic      . NEXIUM 40 MG capsule TAKE  1 CAPSULE DAILY  90 capsule  2  . NON FORMULARY CPAP      . rosuvastatin (CRESTOR) 10 MG tablet Take 1 tablet (10 mg total) by mouth at bedtime.  90 tablet  1  . sitaGLIPtin (JANUVIA) 100 MG tablet Take 1 tablet (100 mg total) by mouth daily.      . valsartan (DIOVAN) 160 MG tablet Take 160 mg by mouth daily before breakfast.       No current facility-administered medications on file prior to visit.      Has appt for dental implants on Tuesday and had to cancel it    Review of Systems Review of Systems  Constitutional: Negative for fever, appetite change,  and unexpected weight change.  ENT pos for cong and drip  Eyes: Negative for pain and visual disturbance.  Respiratory: Negative for  shortness of breath.  pos for cough and tightness Cardiovascular: Negative for cp or palpitations    Gastrointestinal: Negative for nausea, diarrhea and constipation.  Genitourinary: Negative for urgency and frequency.  Skin: Negative for pallor or rash   Neurological: Negative for weakness, light-headedness, numbness and headaches.  Hematological: Negative for adenopathy. Does not bruise/bleed easily.  Psychiatric/Behavioral: Negative for dysphoric mood. The patient is not nervous/anxious.         Objective:   Physical Exam  Constitutional: She appears well-developed and well-nourished. No distress.  obese and well appearing   HENT:  Head: Normocephalic and atraumatic.  Right Ear: External ear normal.  Left Ear: External ear normal.  Mouth/Throat: Oropharynx is clear and moist. No oropharyngeal exudate.  Nares are injected and congested    No sinus tenderness  Eyes: Conjunctivae and EOM are normal. Pupils are equal, round, and reactive to light. Right eye exhibits no discharge. Left eye exhibits no discharge.  Neck: Normal range of motion. Neck supple.  Cardiovascular: Regular rhythm.  Exam reveals  no gallop.   Pulmonary/Chest: Effort normal. No respiratory distress. She has wheezes. She has no rales.  Diffusely distant bs  Scant exp wheezes at the bases Mildly prolonged exp phase   Lymphadenopathy:    She has no cervical adenopathy.  Neurological: She is alert.  Skin: Skin is warm and dry. No rash noted.  Psychiatric: She has a normal mood and affect.          Assessment & Plan:

## 2013-07-05 NOTE — Patient Instructions (Signed)
Take the augmentin as directed as well as the predisone taper Fluids and rest  Continue inhalers Update if not starting to improve in a week or if worsening

## 2013-07-05 NOTE — Assessment & Plan Note (Addendum)
Cover with augmentin Pred taper and disc exp of inc blood sugars as well as other side eff Continue inhalers Disc symptomatic care - see instructions on AVS Update if not starting to improve in a week or if worsening

## 2013-07-10 MED ORDER — FLUOXETINE HCL 10 MG PO CAPS: ORAL_CAPSULE | ORAL | Status: DC

## 2013-07-10 NOTE — Telephone Encounter (Signed)
Pt said [name] did not receive 06/22/13 fluoxetine refill; spoke with Mickel Baas pharmacist at [facility] and gave verbal order for fluoxetine 10 mg.

## 2013-07-16 ENCOUNTER — Telehealth: Payer: Self-pay | Admitting: Family Medicine

## 2013-07-16 MED ORDER — FLUCONAZOLE 150 MG PO TABS: 150.0000 mg | ORAL_TABLET | Freq: Every day | ORAL | Status: DC

## 2013-07-16 NOTE — Telephone Encounter (Signed)
Spoke to patient and was advised that her mouth has white bumps in it and they burn especially after eating fruit and painful.  .Patient states that she has been on an antibiotic and always gets a yeast infection.  Patient states that she has vagina burning, itching, no discharge and request medication been sent to [facility]

## 2013-07-16 NOTE — Telephone Encounter (Signed)
Sent!

## 2013-07-16 NOTE — Telephone Encounter (Signed)
Pt states she has yeast infection and possible thrush in her mouth.  Would like medication called in to [facility].  Best number to call pt is [phone]

## 2013-07-16 NOTE — Telephone Encounter (Signed)
Patient notified by telephone that prescription has been sent to the pharmacy per Dr. [name].

## 2013-07-17 DIAGNOSIS — E559 Vitamin D deficiency, unspecified: Secondary | ICD-10-CM | POA: Diagnosis not present

## 2013-07-17 DIAGNOSIS — E039 Hypothyroidism, unspecified: Secondary | ICD-10-CM | POA: Diagnosis not present

## 2013-07-17 DIAGNOSIS — E782 Mixed hyperlipidemia: Secondary | ICD-10-CM | POA: Diagnosis not present

## 2013-07-17 DIAGNOSIS — E1149 Type 2 diabetes mellitus with other diabetic neurological complication: Secondary | ICD-10-CM | POA: Diagnosis not present

## 2013-07-19 DIAGNOSIS — E1149 Type 2 diabetes mellitus with other diabetic neurological complication: Secondary | ICD-10-CM | POA: Diagnosis not present

## 2013-07-19 DIAGNOSIS — E782 Mixed hyperlipidemia: Secondary | ICD-10-CM | POA: Diagnosis not present

## 2013-07-19 DIAGNOSIS — E039 Hypothyroidism, unspecified: Secondary | ICD-10-CM | POA: Diagnosis not present

## 2013-07-19 DIAGNOSIS — E559 Vitamin D deficiency, unspecified: Secondary | ICD-10-CM | POA: Diagnosis not present

## 2013-07-19 DIAGNOSIS — I10 Essential (primary) hypertension: Secondary | ICD-10-CM | POA: Diagnosis not present

## 2013-07-19 DIAGNOSIS — E1142 Type 2 diabetes mellitus with diabetic polyneuropathy: Secondary | ICD-10-CM | POA: Diagnosis not present

## 2013-07-19 DIAGNOSIS — E538 Deficiency of other specified B group vitamins: Secondary | ICD-10-CM | POA: Diagnosis not present

## 2013-07-24 DIAGNOSIS — L989 Disorder of the skin and subcutaneous tissue, unspecified: Secondary | ICD-10-CM | POA: Diagnosis not present

## 2013-07-24 DIAGNOSIS — R234 Changes in skin texture: Secondary | ICD-10-CM | POA: Diagnosis not present

## 2013-07-24 DIAGNOSIS — D1801 Hemangioma of skin and subcutaneous tissue: Secondary | ICD-10-CM | POA: Diagnosis not present

## 2013-07-24 DIAGNOSIS — D239 Other benign neoplasm of skin, unspecified: Secondary | ICD-10-CM | POA: Diagnosis not present

## 2013-07-24 DIAGNOSIS — L988 Other specified disorders of the skin and subcutaneous tissue: Secondary | ICD-10-CM | POA: Diagnosis not present

## 2013-07-24 DIAGNOSIS — L821 Other seborrheic keratosis: Secondary | ICD-10-CM | POA: Diagnosis not present

## 2013-07-24 DIAGNOSIS — D485 Neoplasm of uncertain behavior of skin: Secondary | ICD-10-CM | POA: Diagnosis not present

## 2013-07-24 DIAGNOSIS — Z808 Family history of malignant neoplasm of other organs or systems: Secondary | ICD-10-CM | POA: Diagnosis not present

## 2013-07-27 MED ORDER — FLUOXETINE HCL 10 MG PO CAPS: ORAL_CAPSULE | ORAL | Status: DC

## 2013-07-27 NOTE — Telephone Encounter (Signed)
Pt said since her ins change pt can get her meds as inexpensively at [facility] as can thru [facility] order; pt said mail order fluoxetine had been discontinued and pt request # 120 fluoxetine 10 mg taking 2 in AM and 2 in PM sent to [facility].advised pt done.

## 2013-07-27 NOTE — Addendum Note (Signed)
Addended by: Helene Shoe on: 07/27/2013 09:45 AM   Modules accepted: Orders

## 2013-07-27 NOTE — Telephone Encounter (Signed)
Pt called back and said Alexandria [name] has not sent her fluoxetine yet; issue with ins. Asked pt to contact [facility] and have pharmacy contact our office of what is needed for pt to get her rx. Pt voiced understanding and will contact [facility].

## 2013-08-03 ENCOUNTER — Ambulatory Visit (INDEPENDENT_AMBULATORY_CARE_PROVIDER_SITE_OTHER): Payer: BC Managed Care – PPO | Admitting: Internal Medicine

## 2013-08-03 ENCOUNTER — Encounter: Payer: Self-pay | Admitting: Internal Medicine

## 2013-08-03 VITALS — BP 100/60 | HR 93 | Temp 99.0°F | Resp 20 | Wt 227.0 lb

## 2013-08-03 DIAGNOSIS — J209 Acute bronchitis, unspecified: Secondary | ICD-10-CM

## 2013-08-03 DIAGNOSIS — E1149 Type 2 diabetes mellitus with other diabetic neurological complication: Secondary | ICD-10-CM

## 2013-08-03 DIAGNOSIS — J449 Chronic obstructive pulmonary disease, unspecified: Secondary | ICD-10-CM

## 2013-08-03 MED ORDER — AMOXICILLIN-POT CLAVULANATE 875-125 MG PO TABS: 1.0000 | ORAL_TABLET | Freq: Two times a day (BID) | ORAL | Status: DC

## 2013-08-03 NOTE — Assessment & Plan Note (Signed)
Has been well controlled per her report

## 2013-08-03 NOTE — Progress Notes (Signed)
Subjective:    Patient ID: Alexandria [name], female    DOB: 09/28/1935, 78 y.o.   MRN: [id number]  HPI Feels sick again like last month--did recover from that Sore throat Cough with green mucus Ear "aggravation" Watering eyes--using zaditor  Sick for 3 days No fever Felt really cold and couldn't warm up--then felt hot (last night) Usual DOE--no worse Hasn't needed emergency inhaler No wheezing  Using advil  Current Outpatient Prescriptions on File Prior to Visit  Medication Sig Dispense Refill  . albuterol (PROAIR HFA) 108 (90 BASE) MCG/ACT inhaler Inhale 2 puffs into the lungs every 6 (six) hours as needed for wheezing or shortness of breath.      . ALPRAZolam (XANAX) 0.5 MG tablet Take 0.5 tablets (0.25 mg total) by mouth 2 (two) times daily. As needed for anxiety  90 tablet  1  . aspirin 81 MG EC tablet Take 81 mg by mouth daily.        . budesonide-formoterol (SYMBICORT) 160-4.5 MCG/ACT inhaler INHALE 2 PUFFS FIRST THING IN THE MORNING AND 2 PUFFS AGAIN IN THE EVENING ABOUT 12 HOURS LATER  3 Inhaler  3  . Coenzyme Q-10 200 MG CAPS Take 1 capsule by mouth daily.        . Cyanocobalamin (B-12 PO) Take 1 tablet by mouth daily.      Marland Kitchen EPINEPHrine (EPIPEN) 0.3 mg/0.3 mL DEVI Inject 0.3 mg into the muscle as directed.       . ergocalciferol (VITAMIN D2) 50000 UNITS capsule Take 50,000 Units by mouth once a week.        . ezetimibe (ZETIA) 10 MG tablet Take 1 tablet (10 mg total) by mouth at bedtime.      . famotidine (PEPCID) 20 MG tablet Take 20 mg by mouth at bedtime.       Marland Kitchen FLUoxetine (PROZAC) 10 MG capsule TAKE 2 CAPSULES IN THE MORNING AND TAKE 2 CAPSULES IN THE EVENING  120 capsule  0  . fluticasone (FLONASE) 50 MCG/ACT nasal spray USE 2 SPRAYS INTO THE NOSE EVERY 12 HOURS AS NEEDED FOR STUFFY NOSE  48 g  1  . levothyroxine (SYNTHROID, LEVOTHROID) 150 MCG tablet Take 150 mcg by mouth daily before breakfast. Can not take generic      . NEXIUM 40 MG capsule TAKE 1 CAPSULE DAILY   90 capsule  2  . NON FORMULARY CPAP      . rosuvastatin (CRESTOR) 10 MG tablet Take 1 tablet (10 mg total) by mouth at bedtime.  90 tablet  1  . sitaGLIPtin (JANUVIA) 100 MG tablet Take 1 tablet (100 mg total) by mouth daily.      . valsartan (DIOVAN) 160 MG tablet Take 160 mg by mouth daily before breakfast.       No current facility-administered medications on file prior to visit.    Allergies  Allergen Reactions  . Clarithromycin     REACTION: diff swallowing and mouth blisters  . Codeine Other (See Comments)    Unknown   . Sulfonamide Derivatives     REACTION: closed throat    Past Medical History  Diagnosis Date  . Depression 12/07/1998  . Hypothyroidism 03/08/1968  . Hypertension 03/08/1992  . Hyperlipidemia 10/04/2000  . Diabetes mellitus type II 02/05/2006    Dr. Elyse Hsu with endo  . COPD (chronic obstructive pulmonary disease) 03/08/1998    PFTs 12/12/2002 FEV 1 1.42 (64%) ratio 58 with no better after B2 and DLCO75%; PFTs 11/20/09 FEV1 1.50 (73%)  ratio 50 no better after B2 with DLCO 62%; Hfa 50% 11/20/2009 >75%, 01/13/10 p coaching  . Ventral hernia   . Esophagitis   . Allergy, unspecified not elsewhere classified   . Disorders of bursae and tendons in shoulder region, unspecified     Rotator cuff syndrome, right  . Obesity     NOS  . Peripheral neuropathy     Likely due to DM per Dr. Erling Cruz  . OSA (obstructive sleep apnea)     PSG 01/27/10 AHI 13, pt does not know CPAP settings  . GERD (gastroesophageal reflux disease)     Past Surgical History  Procedure Laterality Date  . Tonsillectomy    . Rotator cuff repair  1984    Right, [place]  . Cesarean section      x2 Breech/ repeat  . Total abdominal hysterectomy  1985    Due to dysmennorhea  . Cholecystectomy  1997  . Thumb release  12/1997    Right  . Carpal tunnel release  12/1997    Right  . Esophagogastroduodenoscopy  12/05/1997    Nml (due to hoarseness)  . Abd u/s  03/19/1999    Nml x2 foci  in liver  . Ct abd w & pelvis wo/w cm      Abd hemangiomas of liver, 1 cm R renal cyst  . Cardiolite persantine  08/24/2000    Nml  . Carotid u/s  08/24/2000    1-39% ICA stenosis  . Dexa  07/03/2003    Nml  . Adenosine myoview  06/02/2007    Nml  . Carotid u/s  06/02/2007    No apprec change   . US echocardiography  06/02/2007  . Hernia repair  01/24/2009    Lap Ventr w/ Lysis of adhesions (Dr. Donne Hazel)  . Knee arthroscopic surgery  years ago    right  . Shoulder open rotator cuff repair  02/08/2012    Procedure: ROTATOR CUFF REPAIR SHOULDER OPEN;  Surgeon: Magnus Sinning, MD;  Location: [facility];  Service: Orthopedics;  Laterality: Left;  Left Shoulder Open Anterior Acrominectomy Rotator Cuff Repair Open Distal Clavicle Resection ,tissue mend graft, and repair of biceps tendon    Family History  Problem Relation Age of Onset  . Heart disease Mother   . Thyroid disease Mother   . Emphysema Father     One lung  . Breast cancer Sister   . Cystic fibrosis Sister   . Hyperthyroidism Sister   . Esophageal cancer Neg Hx   . Cancer Neg Hx     Head or neck  . Osteoarthritis Brother   . Hyperthyroidism Brother     History   Social History  . Marital Status: Married    Spouse Name: N/A    Number of Children: N/A  . Years of Education: N/A   Occupational History  . Retired    Social History Main Topics  . Smoking status: Former Smoker -- 2.00 packs/day for 50 years    Types: Cigarettes    Quit date: 02/05/2006  . Smokeless tobacco: Never Used  . Alcohol Use: 1.0 oz/week    2 drink(s) per week     Comment: occasional  . Drug Use: No  . Sexual Activity: Not on file   Other Topics Concern  . Not on file   Social History Narrative   Married with 2 children   Drinks 4 caffeinated beverages per day   Enjoys painting   Review of Systems No rash  No vomiting or diarrhea Appetite is okay A1c has been fine--sees Dr Altheimer Having dental surgery with  anaesthesia on June 7th     Objective:   Physical Exam  Constitutional: She appears well-developed and well-nourished. No distress.  HENT:  Mild maxillary tenderness Slight pharyngeal injection TMs normal Mild nasal inflammation  Neck: Normal range of motion. Neck supple.  Mildly tender nodes  Pulmonary/Chest: Effort normal and breath sounds normal. No respiratory distress. She has no wheezes. She has no rales.  Lymphadenopathy:    She has cervical adenopathy.          Assessment & Plan:

## 2013-08-03 NOTE — Assessment & Plan Note (Signed)
Likely bacterial with purulent mucus Will treat again with augmentin Would change to levaquin next week if persists

## 2013-08-03 NOTE — Assessment & Plan Note (Signed)
Not exacerbated No change in Rx

## 2013-08-03 NOTE — Progress Notes (Signed)
Pre visit review using our clinic review tool, if applicable. No additional management support is needed unless otherwise documented below in the visit note. 

## 2013-08-10 ENCOUNTER — Ambulatory Visit (INDEPENDENT_AMBULATORY_CARE_PROVIDER_SITE_OTHER): Payer: BC Managed Care – PPO

## 2013-08-10 VITALS — BP 120/68 | HR 82 | Resp 16 | Ht 64.0 in | Wt 230.0 lb

## 2013-08-10 DIAGNOSIS — M79609 Pain in unspecified limb: Secondary | ICD-10-CM

## 2013-08-10 DIAGNOSIS — E1149 Type 2 diabetes mellitus with other diabetic neurological complication: Secondary | ICD-10-CM

## 2013-08-10 DIAGNOSIS — E1140 Type 2 diabetes mellitus with diabetic neuropathy, unspecified: Secondary | ICD-10-CM

## 2013-08-10 DIAGNOSIS — E1142 Type 2 diabetes mellitus with diabetic polyneuropathy: Secondary | ICD-10-CM | POA: Diagnosis not present

## 2013-08-10 DIAGNOSIS — IMO0002 Reserved for concepts with insufficient information to code with codable children: Secondary | ICD-10-CM

## 2013-08-10 DIAGNOSIS — M792 Neuralgia and neuritis, unspecified: Secondary | ICD-10-CM

## 2013-08-10 NOTE — Patient Instructions (Signed)
Diabetes and Foot Care Diabetes may cause you to have problems because of poor blood supply (circulation) to your feet and legs. This may cause the skin on your feet to become thinner, break easier, and heal more slowly. Your skin may become dry, and the skin may peel and crack. You may also have nerve damage in your legs and feet causing decreased feeling in them. You may not notice minor injuries to your feet that could lead to infections or more serious problems. Taking care of your feet is one of the most important things you can do for yourself.  HOME CARE INSTRUCTIONS  Wear shoes at all times, even in the house. Do not go barefoot. Bare feet are easily injured.  Check your feet daily for blisters, cuts, and redness. If you cannot see the bottom of your feet, use a mirror or ask someone for help.  Wash your feet with warm water (do not use hot water) and mild soap. Then pat your feet and the areas between your toes until they are completely dry. Do not soak your feet as this can dry your skin.  Apply a moisturizing lotion or petroleum jelly (that does not contain alcohol and is unscented) to the skin on your feet and to dry, brittle toenails. Do not apply lotion between your toes.  Trim your toenails straight across. Do not dig under them or around the cuticle. File the edges of your nails with an emery board or nail file.  Do not cut corns or calluses or try to remove them with medicine.  Wear clean socks or stockings every day. Make sure they are not too tight. Do not wear knee-high stockings since they may decrease blood flow to your legs.  Wear shoes that fit properly and have enough cushioning. To break in new shoes, wear them for just a few hours a day. This prevents you from injuring your feet. Always look in your shoes before you put them on to be sure there are no objects inside.  Do not cross your legs. This may decrease the blood flow to your feet.  If you find a minor scrape,  cut, or break in the skin on your feet, keep it and the skin around it clean and dry. These areas may be cleansed with mild soap and water. Do not cleanse the area with peroxide, alcohol, or iodine.  When you remove an adhesive bandage, be sure not to damage the skin around it.  If you have a wound, look at it several times a day to make sure it is healing.  Do not use heating pads or hot water bottles. They may burn your skin. If you have lost feeling in your feet or legs, you may not know it is happening until it is too late.  Make sure your health care provider performs a complete foot exam at least annually or more often if you have foot problems. Report any cuts, sores, or bruises to your health care provider immediately. SEEK MEDICAL CARE IF:   You have an injury that is not healing.  You have cuts or breaks in the skin.  You have an ingrown nail.  You notice redness on your legs or feet.  You feel burning or tingling in your legs or feet.  You have pain or cramps in your legs and feet.  Your legs or feet are numb.  Your feet always feel cold. SEEK IMMEDIATE MEDICAL CARE IF:   There is increasing redness,   swelling, or pain in or around a wound.  There is a red line that goes up your leg.  Pus is coming from a wound.  You develop a fever or as directed by your health care provider.  You notice a bad smell coming from an ulcer or wound. Document Released: 02/20/2000 Document Revised: 10/25/2012 Document Reviewed: 08/01/2012 [employer] Patient Information 2014 [employer].  

## 2013-08-10 NOTE — Progress Notes (Signed)
Subjective:    Patient ID: Alexandria [name], female    DOB: August 27, 1935, 78 y.o.   MRN: [id number]  HPI Comments: "I have these neuropathy pains"  Patient c/o burning, numbness, tingling feet bilateral for several years. She has these sensations in the whole foot, but feels more around the forefoot and toes. She says that wearing her shoes makes them feel better. The confinement of the shoes on feels good. She has even slept in her shoes. She has seen 2 other podiatrist with no success. She has COPD and makes doing these for her feet difficult. She goes to a salon to have her toenails trimmed.  Foot Pain Associated symptoms include coughing, headaches and a sore throat.      Review of Systems  HENT: Positive for sinus pressure and sore throat.   Eyes: Positive for itching.  Respiratory: Positive for apnea, cough and wheezing.   Endocrine: Positive for cold intolerance, heat intolerance and polyphagia.  Neurological: Positive for light-headedness and headaches.  Hematological: Bruises/bleeds easily.  All other systems reviewed and are negative.      Objective:   Physical Exam 78 year old white female well-developed well-nourished oriented x3 presents at this time with a long-standing history of neuritis neuralgia she is loss of sensation and she some instability in her feet unsteadiness at times but also at times has burning stinging shooting sensations and paresthesias affect her feet keep her awake at night. Patient was to avoid Lyrica she's tried twice in the past cannot tolerate it and wishes to avoid the potential side effects. Patient currently taking a variety of medicines including antidepressant medications Prozac patient also is diabetic x8+ years. Lower extremity objective findings as follows vascular status is intact although diminished DP and PT pulses plus one over 4 bilateral capillary refill time 3 seconds mild varicosities noted temperature warm to cool turgor diminished  there is no edema rubor pallor noted neurologically epicritic and proprioceptive sensations grossly diminished on Semmes Weinstein testing to the lower leg the dorsum and plantar aspects of the foot has complete absence S. Semmes Weinstein sensations intact vibratory sensation is normal plantar response DTRs not elicited. Dermatologically skin color pigment normal hair growth diminished absent nails somewhat criptotic and friable orthopedic biomechanical exam rectus foot type patient does have some spasms digital contractures with clawing or hammering 234 and 5 of both feet. Does have some cramping the muscles lower leg at times posse associated with neuropathy and neuralgia as well. No history of injury or trauma noted patient is looking for other alternatives Caesar and vitamins supplementation and taking the right medicines are A1c is a running 6.1 she's well-managed diabetic at this time.      Assessment & Plan:  Assessment peripheral neuropathy and neuritis and neuralgia effecting both lower extremities both with numbness which is mixture vulnerable to problems or difficulties as well as hyperesthesia hypersensitivity at times at this time discussed the options patient advised is no cure for the complete loss of sensation is bursal likely nonfunctional however the hypersensitivity can be addressed with medications which she declines at this time any other alternatives using electrical stimulator such as a TENS type device or PDC restorer stocking nerve stimulator. Patient is amenable to utilizing the stocking system were while sleeping help reduce her painful nighttime neuralgia. We'll followup in the next 2-3 months for reassessment however we'll make arrangements through the distributor to push the patient with the appropriate nerve stimulator and instructions and training and use. Followup in the  interim as needed  Harriet Masson DPM

## 2013-08-17 ENCOUNTER — Ambulatory Visit (INDEPENDENT_AMBULATORY_CARE_PROVIDER_SITE_OTHER): Payer: BC Managed Care – PPO | Admitting: Internal Medicine

## 2013-08-17 ENCOUNTER — Encounter: Payer: Self-pay | Admitting: Internal Medicine

## 2013-08-17 ENCOUNTER — Telehealth: Payer: Self-pay | Admitting: *Deleted

## 2013-08-17 VITALS — BP 112/70 | HR 75 | Temp 98.2°F | Ht 64.0 in | Wt 230.4 lb

## 2013-08-17 DIAGNOSIS — J449 Chronic obstructive pulmonary disease, unspecified: Secondary | ICD-10-CM | POA: Diagnosis not present

## 2013-08-17 DIAGNOSIS — Z23 Encounter for immunization: Secondary | ICD-10-CM

## 2013-08-17 NOTE — Progress Notes (Signed)
Subjective:     Patient ID: Alexandria [name], female   DOB: [birth year]    MRN: [id number]   Brief patient profile:  77  yowf quit smoking Dec 2006 with GOLD II COPD FEV1 1.42 (64%) in 2004   HPI September 16, 2009 1st pulmonary office eval in emr She c/o worsening SOB for the past several yrs. She gets out of breath walking up stairs and walking flat surface approx 500 ft. She states that hot/humid weather makes breathing worse. supposed to be taking spiriva and advair but not consistent. better after proaire though hfa technique margnial. Notes mild reflux/ dysphagia/ hoarseness also rec .stop advair  Symbicort 2 puffs first thing in am and 2 puffs again in pm about 12 hours later  Take spiriva after symbicort  if needed proaire 2 puffs every 4 hours with goal of needing it less than twice daily  Nexium Take one 30-60 min before first meal of the day      11/24/2010 f/u ov/Wert cc breathing no better and using excess saba but no increase cough or purulent secretions or overt HB on present complex regimen which appears to be following per med calendar rec Work on inhaler technique:  GERD diet Only use proair/combent  if can't catch  your breath at rest. Prednisone x 6 day taper off  Try Combivent 2 puffs every 4 hours in place of proaire should work better and last longer    04/15/2011 f/u ov/Wert  Did not bring med calendar, not finding it useful despite complex rx, cc  breathing has improved since being in pulm rehab. No cough now that off dpi. Still needs HC parking with large events like concerts. rec Avelox 400mg  daily for 7 days  Mucinex DM Twice daily As needed  Cough/congestion  Prednisone taper over next week.  Fluids and rest  I will call with xray results.    08/24/2012 f/u ov/Wert re GOLD II COPD  Chief Complaint  Patient presents with  . Acute Visit    Pt c/o DOE gradually worsening for the past 6 months. She gets OOB walking from room to room at home, and can not walk through  the grocery store any longer.   rec Only use your albuterol(proventil) as a rescue medication to be used if you can't catch your breath by resting or doing a relaxed purse lip breathing pattern. The less you use it, the better it will work when you need it. Ok to use up to 2 puffs every 4 hours but goal is less than twice weekly Work on inhaler technique    10/10/2012 f/u ov/Wert re COPD GOLD II  Chief Complaint  Patient presents with  . 6 wk follow up    with PFTs.  Breahting is unchanged.    = doe across parking lot, no longer doing groceries at all Not clear what she's doing for rescue inhaler ? combivent ? Helping some but not sure  rec Continue symbicort 160 Take 2 puffs first thing in am and then another 2 puffs about 12 hours later.  Please schedule a follow up visit in 3 months but call sooner if needed with all inhalers in hand.   01/10/2013 f/u ov/Wert re: GOLD II COPD/ did not bring all active inhalers  Chief Complaint  Patient presents with  . Follow-up    Pt states that her breathing seems to be doing better. Has not used proair since her last visit.     Not limited from  desired adls, overall very happy with just the symbicort and less throat irritation off spiriva rec Plan A= Automatic = Symbicort Take 2 puffs first thing in am and then another 2 puffs about 12 hours later. (we did in a year's supply today) Plan B = Backup = Only use your albuterol as a rescue medication to be used if you can't catch your breath by resting or doing a relaxed purse lip breathing pattern   08/17/2013 f/u ov/Wert re: preop clearance on symbicort 160 2bid   Chief Complaint  Patient presents with  . Follow-up    Pt states needs pulmonary clearance for dental procedure. She has had this scheduled x 2- had to cancel due to bronchitis and then had sinus infection.  Pt states that her breathing is doing well today.  She c/o cough for the past 2 wks- mainly non prod, but occ will produce minimal  clear sputum.   1st surg canceled June 30 2013  2nd happened 30 days later  [facility] meds 3 days prior to OV  And feeling fine now. Not needing any rescue saba  Not limited by breathing from desired activities      No obvious daytime variabilty or assoc chronic cough or cp or chest tightness, subjective wheeze overt sinus or hb symptoms. No unusual exp hx or h/o childhood pna/ asthma or knowledge of premature birth.  Has not tried saba to see if helps  Sleeping ok without nocturnal  or early am exacerbation  of respiratory  c/o's or need for noct saba. Also denies any obvious fluctuation of symptoms with weather or environmental changes or other aggravating or alleviating factors except as outlined above    Current Medications, Allergies, Past Medical History, Past Surgical History, Family History, and Social History were reviewed in [employer] record.  ROS  The following are not active complaints unless bolded sore throat, dysphagia, dental problems, itching, sneezing,  nasal congestion or excess/ purulent secretions, ear ache,   fever, chills, sweats, unintended wt loss, pleuritic or exertional cp, hemoptysis,  orthopnea pnd or leg swelling, presyncope, palpitations, heartburn, abdominal pain, anorexia, nausea, vomiting, diarrhea  or change in bowel or urinary habits, change in stools or urine, dysuria,hematuria,  rash, arthralgias, visual complaints, headache, numbness weakness or ataxia or problems with walking or coordination,  change in mood/affect or memory.            Past Medical History:  Depression (12/07/1998)  Hypothyroidism (03/08/1968)  Hypertension (03/08/1992)  Hyperlipidemia (10/04/2000)  Diabetes mellitus, type II (02/05/2006)- Dr. Elyse Hsu with endo  COPD (03/08/1998)  - PFT's 12/12/2002 FEV1 1.42 (64%) ratio 58 with no better after B2 and DLCO75%  - PFT's 9/ 15/11 FEV1 1.50 (73%) ratio 50 no better after B2 with DLC0 62%  - hfa 50%  November 20, 2009 > 75% January 13, 2010 p coaching >  75% 10/02/2010  VENTRAL HERNIA (ICD-553.20)  ESOPHAGITIS (ICD-530.10)  EDEMA (ICD-782.3)  SOB (ICD-786.05)  ALLERGY (ICD-995.3)  BRONCHITIS, OBSTRUCTIVE CHRONIC W/EXACRB (ICD-491.21)  ROTATOR CUFF SYNDROME, RIGHT (ICD-726.10)  SYNDROME, ROTATOR CUFF NOS (ICD-726.10)  OBESITY NOS (ICD-278.00)  NUMBNESS (ICD-782.0)  POSTMENOPAUSAL ON HORMONE REPLACEMENT THERAPY (ICD-V07.4)  Complex med regimen , med calendar 10/19/10     Family History:  Father dec 75 Emphysema One lung  Mother dec 93 Heart Thyrroid Dz  Brother A 39 Unknown  Brother A 92 Osteo Hyperthyr  Brother A 58 BACK Probs  Sister A 60 Breast Cysts Fibro Back Probs Hypothyr  no  esophageal cancer  No known head and neck cancers.  no premature ascvd    Social History:  married, 2 children, she is retired, former smoker, drinks 2 alcoholic beverages per week, drinks 4 caffeinated beverages per day. Enjoys painting.  Former smoker. Quit on July 9th 2011. She smoked 40 yrs up to 1 ppd           Objective:   Physical Exam  Elderly amb wf NAD   wt 229 September 16, 2009 >  >235 10/19/10 >04/15/2011  234 >07/16/2011 226 > 232 08/24/2012 > 10/10/2012 234 > 01/10/2013 229 > 08/17/2013  230    HEENT mild turbinate edema.  Oropharynx no thrush or excess pnd or cobblestoning.  No JVD or cervical adenopathy. Mild accessory muscle hypertrophy. Trachea midline, nl thryroid. Chest was hyperinflated by percussion with diminished breath sounds and mild  increased exp time without wheeze. Hoover sign positive at late inspiration. Regular rate and rhythm without murmur gallop or rub or increase P2 or edema.  Abd: no hsm, nl excursion. Ext warm without cyanosis or clubbing.       CXR  10/10/2012 :   No active disease. Mild hyperinflation again noted.         Assessment:

## 2013-08-17 NOTE — Patient Instructions (Signed)
prevnar today   You are cleared for oral surgery

## 2013-08-18 NOTE — Assessment & Plan Note (Signed)
-   PFT's 12/12/2002 FEV1 1.42 (64%) ratio 58 with no better after B2 and DLCO 75%  - PFT's 9/ 15/11 FEV1 1.50 (73%) ratio 50 no better after B2 with DLC0 62%  - PFT's 10/10/2012   1.29 (61%) ratio 57 and no better p B2, DLCO  67% and corrects to 81  >>referred to pulmonary rehab 10/19/2010 > some better doe - Alpha one AT genotype 04/15/2011 > MM - 08/24/2012  Walked RA x 1 laps @ 185 ft each stopped due to feet hurt > sob s desat - hfa 90% 10/10/2012   She's had sev acute exac of what sounds like rhinitis/sinusitis/bronchitis but well compensated at present, albeit three days p stopping her acute regimen, but should be good to go for oral surgery at this point    Each maintenance medication was reviewed in detail including most importantly the difference between maintenance and as needed and under what circumstances the prns are to be used.  Please see instructions for details which were reviewed in writing and the patient given a copy.

## 2013-08-31 ENCOUNTER — Telehealth: Payer: Self-pay | Admitting: *Deleted

## 2013-08-31 NOTE — Telephone Encounter (Signed)
Prescription sent to [facility] for [facility] Restorer Stimulator and Garment Sock B/L foot.  Diagnosis is Neuropathy   [facility] Ph: [phone]

## 2013-09-03 ENCOUNTER — Telehealth: Payer: Self-pay | Admitting: Internal Medicine

## 2013-09-03 NOTE — Telephone Encounter (Signed)
Last OV note from 08-17-13 states she is cleared for surgery. This has been faxed. Pt is aware. Milam Bing, CMA

## 2013-09-12 ENCOUNTER — Other Ambulatory Visit: Payer: Self-pay | Admitting: Family Medicine

## 2013-09-12 NOTE — Telephone Encounter (Signed)
Received refill request electronically from pharmacy. Last office visit 08/03/13/ acute, Is it okay to refill medication? Patient request 90 day supply?

## 2013-09-13 NOTE — Telephone Encounter (Signed)
Sent. Thanks.   

## 2013-09-18 ENCOUNTER — Telehealth: Payer: Self-pay | Admitting: Internal Medicine

## 2013-09-18 NOTE — Telephone Encounter (Signed)
lmtcb x1 

## 2013-09-19 NOTE — Telephone Encounter (Signed)
Called and spoke to pt. Pt stated that Dorothyann Peng had already faxed the OV note to the doctors office that states she is cleared for surgery. Pt stated the office has received the fax and nothing further is needed.

## 2013-10-18 DIAGNOSIS — E1149 Type 2 diabetes mellitus with other diabetic neurological complication: Secondary | ICD-10-CM | POA: Diagnosis not present

## 2013-10-18 DIAGNOSIS — E559 Vitamin D deficiency, unspecified: Secondary | ICD-10-CM | POA: Diagnosis not present

## 2013-10-18 DIAGNOSIS — E1142 Type 2 diabetes mellitus with diabetic polyneuropathy: Secondary | ICD-10-CM | POA: Diagnosis not present

## 2013-10-18 DIAGNOSIS — E039 Hypothyroidism, unspecified: Secondary | ICD-10-CM | POA: Diagnosis not present

## 2013-10-18 DIAGNOSIS — E538 Deficiency of other specified B group vitamins: Secondary | ICD-10-CM | POA: Diagnosis not present

## 2013-10-18 DIAGNOSIS — I10 Essential (primary) hypertension: Secondary | ICD-10-CM | POA: Diagnosis not present

## 2013-10-18 DIAGNOSIS — E782 Mixed hyperlipidemia: Secondary | ICD-10-CM | POA: Diagnosis not present

## 2013-10-25 NOTE — Telephone Encounter (Signed)
Prescription was sent to [facility] for a [facility] Restorer Stimulator and Garment Sock Bilateral for Neuropathy. Ph: [phone], Rep: Jonni Sanger [facility])

## 2013-11-07 ENCOUNTER — Ambulatory Visit: Payer: BC Managed Care – PPO

## 2013-11-22 ENCOUNTER — Telehealth: Payer: Self-pay | Admitting: Family Medicine

## 2013-11-22 NOTE — Telephone Encounter (Signed)
Pt would like to change pharmacy FROM [place] for prescription ALPRAZolam (XANAX) 0.5 MG tablet [[id number]]  She does request a 90 day refill.  Best number to call pt is [phone]

## 2013-11-23 MED ORDER — ALPRAZOLAM 0.5 MG PO TABS: 0.2500 mg | ORAL_TABLET | Freq: Two times a day (BID) | ORAL | Status: DC

## 2013-11-23 NOTE — Telephone Encounter (Signed)
Please call in.  Thanks.   

## 2013-11-23 NOTE — Telephone Encounter (Signed)
Rx called in to pharmacy. 

## 2013-12-18 ENCOUNTER — Telehealth: Payer: Self-pay

## 2013-12-18 NOTE — Telephone Encounter (Signed)
Pt recently had flu shot at Dr Marlene Lard office and pt had pneumovax 10/24/2008 and prevnar 13 08/17/13. Pt wants to know if needs another pneumonia vaccine.

## 2013-12-19 NOTE — Telephone Encounter (Signed)
She should be done with PNA vaccines.  I don't see the flu vaccine in the EMR.  Whoever gave the shot needs to enter that.

## 2013-12-19 NOTE — Telephone Encounter (Signed)
Patient notified as instructed by telephone. Patient stated that she is going to call Dr. Elodia Florence office and advise them of this. Patient stated that if she is confused and did not get the flu shot she will call back and get an appointment scheduled to get it here.

## 2013-12-26 ENCOUNTER — Ambulatory Visit (INDEPENDENT_AMBULATORY_CARE_PROVIDER_SITE_OTHER): Payer: BC Managed Care – PPO

## 2013-12-26 DIAGNOSIS — Z23 Encounter for immunization: Secondary | ICD-10-CM | POA: Diagnosis not present

## 2014-01-02 ENCOUNTER — Telehealth: Payer: Self-pay

## 2014-01-02 DIAGNOSIS — E2839 Other primary ovarian failure: Secondary | ICD-10-CM

## 2014-01-02 NOTE — Telephone Encounter (Signed)
Pt left v/m requesting referral to [facility] mammography phone # [phone]; pt would like to have bone density test done on 01/07/14 or 01/08/14.pt request cb.Please advise. Pts last annual screening was 03/12/13.

## 2014-01-02 NOTE — Telephone Encounter (Signed)
Ordered. Thanks

## 2014-01-03 NOTE — Telephone Encounter (Signed)
Spoke to pt and advised

## 2014-01-07 DIAGNOSIS — E119 Type 2 diabetes mellitus without complications: Secondary | ICD-10-CM | POA: Diagnosis not present

## 2014-01-07 DIAGNOSIS — H2513 Age-related nuclear cataract, bilateral: Secondary | ICD-10-CM | POA: Diagnosis not present

## 2014-01-07 LAB — HM DIABETES EYE EXAM

## 2014-01-22 DIAGNOSIS — Z1231 Encounter for screening mammogram for malignant neoplasm of breast: Secondary | ICD-10-CM | POA: Diagnosis not present

## 2014-01-22 DIAGNOSIS — M8580 Other specified disorders of bone density and structure, unspecified site: Secondary | ICD-10-CM | POA: Diagnosis not present

## 2014-01-24 ENCOUNTER — Telehealth: Payer: Self-pay | Admitting: Family Medicine

## 2014-01-24 MED ORDER — MECLIZINE HCL 12.5 MG PO TABS: 12.5000 mg | ORAL_TABLET | Freq: Three times a day (TID) | ORAL | Status: DC | PRN

## 2014-01-24 NOTE — Telephone Encounter (Signed)
She could try OTC meclizine if needed, o/w needs eval.  Thanks.

## 2014-01-24 NOTE — Telephone Encounter (Signed)
Pt.notified

## 2014-01-24 NOTE — Telephone Encounter (Signed)
Patient Information:  Caller Name: [place]  Phone: [phone]  Patient: Alexandria [name], Alexandria [name]  Gender: Female  DOB: 1936/03/08  Age: 78 Years  PCP: Elsie Stain Brigitte Pulse) [facility])  Office Follow Up:  Does the office need to follow up with this patient?: Yes  Instructions For The Office: Disposition: Discuss with PCP and callback by Nurse Today.   Symptoms  Reason For Call & Symptoms: Onset 01/17/14 pt had IV to put pt to sleep for dental implant (she has had this previously without problems).   Later in evening began having dizzy spells with standing and walking, pt must stand and walk slowly.   01/24/14 pt feels fine sitting but continues to have to stand and walk slowly to avoid dizziness for past week.  SBP's have been ranging 145-155.  BS within normal limit for pt 130-150.   Oxygen levels 95-98%.   HR 80-90.   Pt's dentist advised pt to call PCP that sometimes Phenergan will help with dizziness.  Pt is leaving town tomorrow morning to visit daughter and would like Dr Josefine Class advise or if anything she can try OTC.    Pt's cell phone   [phone]  Reviewed Health History In EMR: Yes  Reviewed Medications In EMR: Yes  Reviewed Allergies In EMR: Yes  Reviewed Surgeries / Procedures: Yes  Date of Onset of Symptoms: 01/17/2014  Guideline(s) Used:  Dizziness  Disposition Per Guideline:   Discuss with PCP and Callback by Nurse Today  Reason For Disposition Reached:   Taking a medicine that could cause dizziness (e.g., blood pressure medications, diuretics)  Advice Given:  N/A  Patient Will Follow Care Advice:  YES

## 2014-01-25 DIAGNOSIS — N6002 Solitary cyst of left breast: Secondary | ICD-10-CM | POA: Diagnosis not present

## 2014-01-28 ENCOUNTER — Encounter: Payer: Self-pay | Admitting: Family Medicine

## 2014-02-05 ENCOUNTER — Encounter: Payer: Self-pay | Admitting: Family Medicine

## 2014-02-06 ENCOUNTER — Encounter: Payer: Self-pay | Admitting: Family Medicine

## 2014-02-07 ENCOUNTER — Encounter: Payer: Self-pay | Admitting: Family Medicine

## 2014-02-11 ENCOUNTER — Encounter: Payer: Self-pay | Admitting: *Deleted

## 2014-02-12 ENCOUNTER — Telehealth: Payer: Self-pay | Admitting: Family Medicine

## 2014-02-12 ENCOUNTER — Other Ambulatory Visit: Payer: Self-pay | Admitting: *Deleted

## 2014-02-12 MED ORDER — FLUTICASONE PROPIONATE 50 MCG/ACT NA SUSP: NASAL | Status: DC

## 2014-02-12 NOTE — Telephone Encounter (Signed)
Pt left v/m for CMA; Alexandria [name] is faxing copy of mammogram results today and pt saw Dr Tyrone Schimke for eye exam that showed healthy eye exam and eye doctor will forward letter about findings at visit. Pt did not request cb and did not leave contact #.

## 2014-02-13 ENCOUNTER — Encounter: Payer: Self-pay | Admitting: Family Medicine

## 2014-03-08 HISTORY — PX: DENTAL SURGERY: SHX609

## 2014-04-12 ENCOUNTER — Other Ambulatory Visit: Payer: Self-pay | Admitting: Family Medicine

## 2014-04-12 NOTE — Telephone Encounter (Signed)
Electronic refill request. Last Filled:   09/13/2013 for  360 capsule 1 RF   According to refill protocol, please advise.

## 2014-04-13 NOTE — Telephone Encounter (Signed)
Sent, schedule CPE for summer 2016.  Thanks.

## 2014-04-15 NOTE — Telephone Encounter (Signed)
Patient advised.

## 2014-04-26 ENCOUNTER — Encounter: Payer: Self-pay | Admitting: Family Medicine

## 2014-04-26 ENCOUNTER — Ambulatory Visit (INDEPENDENT_AMBULATORY_CARE_PROVIDER_SITE_OTHER): Payer: BLUE CROSS/BLUE SHIELD | Admitting: Family Medicine

## 2014-04-26 VITALS — BP 102/58 | HR 94 | Temp 98.8°F | Wt 229.5 lb

## 2014-04-26 DIAGNOSIS — J441 Chronic obstructive pulmonary disease with (acute) exacerbation: Secondary | ICD-10-CM

## 2014-04-26 MED ORDER — DOXYCYCLINE HYCLATE 100 MG PO TABS: 100.0000 mg | ORAL_TABLET | Freq: Two times a day (BID) | ORAL | Status: DC

## 2014-04-26 MED ORDER — PREDNISONE 10 MG PO TABS: ORAL_TABLET | ORAL | Status: DC

## 2014-04-26 NOTE — Progress Notes (Signed)
Pre visit review using our clinic review tool, if applicable. No additional management support is needed unless otherwise documented below in the visit note.  H/o COPD and controlled DM2.  Was out of town, got back Sunday.  Monday started with fatigue, cough and sputum.  Wheezing more this week.  More SOB this week.  No fevers.  No vomiting, no diarrhea.  Still using regular inhalers, noted relief with SABA, last used this AM.  Sputum is greenish, not clear.    Meds, vitals, and allergies reviewed.   ROS: See HPI.  Otherwise, noncontributory.  GEN: nad, alert and oriented HEENT: mucous membranes moist, tm w/o erythema, nasal exam w/o erythema, scant clear discharge noted,  OP with cobblestoning NECK: supple w/o LA CV: rrr.  Murmur noted PULM: exp wheeze noted but ctab o/w, no inc wob, cough noted EXT: no edema SKIN: no acute rash Speaking in complete sentences.

## 2014-04-26 NOTE — Patient Instructions (Signed)
Take prednisone with food and start the antibiotics today.  Keep using your inhalers.  Take care.

## 2014-04-28 DIAGNOSIS — J441 Chronic obstructive pulmonary disease with (acute) exacerbation: Secondary | ICD-10-CM | POA: Insufficient documentation

## 2014-04-28 NOTE — Assessment & Plan Note (Signed)
Inc in cough, sputum, SOB, but not toxic and still okay for outpatient fu. dw pt about pred caution, start with doxy and f/u prn.  She agrees.

## 2014-05-10 ENCOUNTER — Emergency Department (HOSPITAL_COMMUNITY)
Admission: EM | Admit: 2014-05-10 | Discharge: 2014-05-10 | Disposition: A | Discharge status: Home / Self Care | Payer: BLUE CROSS/BLUE SHIELD | Attending: Emergency Medicine | Admitting: Emergency Medicine

## 2014-05-10 ENCOUNTER — Encounter (HOSPITAL_COMMUNITY): Payer: Self-pay | Admitting: Family Medicine

## 2014-05-10 ENCOUNTER — Encounter: Payer: Self-pay | Admitting: Family Medicine

## 2014-05-10 ENCOUNTER — Ambulatory Visit (INDEPENDENT_AMBULATORY_CARE_PROVIDER_SITE_OTHER): Payer: BLUE CROSS/BLUE SHIELD | Admitting: Family Medicine

## 2014-05-10 ENCOUNTER — Emergency Department (HOSPITAL_COMMUNITY): Payer: BLUE CROSS/BLUE SHIELD

## 2014-05-10 VITALS — BP 118/64 | HR 68 | Temp 97.7°F

## 2014-05-10 DIAGNOSIS — E785 Hyperlipidemia, unspecified: Secondary | ICD-10-CM | POA: Diagnosis not present

## 2014-05-10 DIAGNOSIS — Z8739 Personal history of other diseases of the musculoskeletal system and connective tissue: Secondary | ICD-10-CM | POA: Diagnosis not present

## 2014-05-10 DIAGNOSIS — F329 Major depressive disorder, single episode, unspecified: Secondary | ICD-10-CM | POA: Diagnosis not present

## 2014-05-10 DIAGNOSIS — Z7951 Long term (current) use of inhaled steroids: Secondary | ICD-10-CM | POA: Insufficient documentation

## 2014-05-10 DIAGNOSIS — D1803 Hemangioma of intra-abdominal structures: Secondary | ICD-10-CM | POA: Diagnosis not present

## 2014-05-10 DIAGNOSIS — R1031 Right lower quadrant pain: Secondary | ICD-10-CM | POA: Diagnosis not present

## 2014-05-10 DIAGNOSIS — Z7982 Long term (current) use of aspirin: Secondary | ICD-10-CM | POA: Diagnosis not present

## 2014-05-10 DIAGNOSIS — Z90710 Acquired absence of both cervix and uterus: Secondary | ICD-10-CM | POA: Insufficient documentation

## 2014-05-10 DIAGNOSIS — K2980 Duodenitis without bleeding: Secondary | ICD-10-CM | POA: Diagnosis not present

## 2014-05-10 DIAGNOSIS — E039 Hypothyroidism, unspecified: Secondary | ICD-10-CM | POA: Insufficient documentation

## 2014-05-10 DIAGNOSIS — I10 Essential (primary) hypertension: Secondary | ICD-10-CM | POA: Diagnosis not present

## 2014-05-10 DIAGNOSIS — Z9049 Acquired absence of other specified parts of digestive tract: Secondary | ICD-10-CM | POA: Insufficient documentation

## 2014-05-10 DIAGNOSIS — K5730 Diverticulosis of large intestine without perforation or abscess without bleeding: Secondary | ICD-10-CM | POA: Diagnosis not present

## 2014-05-10 DIAGNOSIS — Z79899 Other long term (current) drug therapy: Secondary | ICD-10-CM | POA: Diagnosis not present

## 2014-05-10 DIAGNOSIS — E119 Type 2 diabetes mellitus without complications: Secondary | ICD-10-CM | POA: Diagnosis not present

## 2014-05-10 DIAGNOSIS — N281 Cyst of kidney, acquired: Secondary | ICD-10-CM | POA: Diagnosis not present

## 2014-05-10 DIAGNOSIS — J449 Chronic obstructive pulmonary disease, unspecified: Secondary | ICD-10-CM | POA: Insufficient documentation

## 2014-05-10 DIAGNOSIS — Z87891 Personal history of nicotine dependence: Secondary | ICD-10-CM | POA: Insufficient documentation

## 2014-05-10 DIAGNOSIS — G629 Polyneuropathy, unspecified: Secondary | ICD-10-CM | POA: Diagnosis not present

## 2014-05-10 DIAGNOSIS — E669 Obesity, unspecified: Secondary | ICD-10-CM | POA: Diagnosis not present

## 2014-05-10 LAB — URINALYSIS, ROUTINE W REFLEX MICROSCOPIC
Bilirubin Urine: NEGATIVE
Glucose, UA: NEGATIVE mg/dL
Ketones, ur: NEGATIVE mg/dL
Leukocytes, UA: NEGATIVE
Nitrite: NEGATIVE
Protein, ur: NEGATIVE mg/dL
Specific Gravity, Urine: 1.01 (ref 1.005–1.030)
Urobilinogen, UA: 0.2 mg/dL (ref 0.0–1.0)
pH: 7 (ref 5.0–8.0)

## 2014-05-10 LAB — COMPREHENSIVE METABOLIC PANEL
ALT: 16 U/L (ref 0–35)
AST: 15 U/L (ref 0–37)
Albumin: 3.5 g/dL (ref 3.5–5.2)
Alkaline Phosphatase: 88 U/L (ref 39–117)
Anion gap: 8 (ref 5–15)
BUN: 20 mg/dL (ref 6–23)
CO2: 25 mmol/L (ref 19–32)
Calcium: 9 mg/dL (ref 8.4–10.5)
Chloride: 106 mmol/L (ref 96–112)
Creatinine, Ser: 1.07 mg/dL (ref 0.50–1.10)
GFR calc Af Amer: 57 mL/min — ABNORMAL LOW (ref >90)
GFR calc non Af Amer: 49 mL/min — ABNORMAL LOW (ref >90)
Glucose, Bld: 99 mg/dL (ref 70–99)
Potassium: 4.3 mmol/L (ref 3.5–5.1)
Sodium: 139 mmol/L (ref 135–145)
Total Bilirubin: 0.6 mg/dL (ref 0.3–1.2)
Total Protein: 6.6 g/dL (ref 6.0–8.3)

## 2014-05-10 LAB — CBC WITH DIFFERENTIAL/PLATELET
Basophils Absolute: 0 10*3/uL (ref 0.0–0.1)
Basophils Relative: 0 % (ref 0–1)
Eosinophils Absolute: 0.2 10*3/uL (ref 0.0–0.7)
Eosinophils Relative: 2 % (ref 0–5)
HCT: 34.9 % — ABNORMAL LOW (ref 36.0–46.0)
Hemoglobin: 9.9 g/dL — ABNORMAL LOW (ref 12.0–15.0)
Lymphocytes Relative: 22 % (ref 12–46)
Lymphs Abs: 2.3 10*3/uL (ref 0.7–4.0)
MCH: 22.3 pg — ABNORMAL LOW (ref 26.0–34.0)
MCHC: 28.4 g/dL — ABNORMAL LOW (ref 30.0–36.0)
MCV: 78.8 fL (ref 78.0–100.0)
Monocytes Absolute: 0.7 10*3/uL (ref 0.1–1.0)
Monocytes Relative: 7 % (ref 3–12)
Neutro Abs: 7.4 10*3/uL (ref 1.7–7.7)
Neutrophils Relative %: 69 % (ref 43–77)
Platelets: 303 10*3/uL (ref 150–400)
RBC: 4.43 MIL/uL (ref 3.87–5.11)
RDW: 17.7 % — ABNORMAL HIGH (ref 11.5–15.5)
WBC: 10.6 10*3/uL — ABNORMAL HIGH (ref 4.0–10.5)

## 2014-05-10 LAB — URINE MICROSCOPIC-ADD ON

## 2014-05-10 MED ORDER — IOHEXOL 300 MG/ML  SOLN
100.0000 mL | Freq: Once | INTRAMUSCULAR | Status: AC | PRN
  Administered 2014-05-10: 100 mL via INTRAVENOUS

## 2014-05-10 MED ORDER — IOHEXOL 300 MG/ML  SOLN
25.0000 mL | Freq: Once | INTRAMUSCULAR | Status: AC | PRN
  Administered 2014-05-10: 25 mL via ORAL

## 2014-05-10 MED ORDER — SODIUM CHLORIDE 0.9 % IV SOLN
Freq: Once | INTRAVENOUS | Status: AC
  Administered 2014-05-10: 17:00:00 via INTRAVENOUS

## 2014-05-10 NOTE — Assessment & Plan Note (Addendum)
Concern for appendicitis.   Patient walked in, triaged and seen.  D/w pt.  Advised ER.  She declined EMS.   She has a driver, wants to go by car.  This looks to okay if she goes immediately. Her husband is going to take her.  Advised that we will call ER in advance of arrival.   She agrees.  She has some mild ectopy w/o CP.  She isn't SOB talking to me.  Okay for car transport.

## 2014-05-10 NOTE — ED Notes (Signed)
Pt here for RLQ pain since yesterday. sts some diarrhea but clear. sts nausea. sts burning pain. sts hurts with palpation.

## 2014-05-10 NOTE — Discharge Instructions (Signed)
Your CT scan showed inflammation of part of your small intestine.  Please restart your stomach medications and take as directed.  Get rechecked immediately if you develop black or bloody stools.     Duodenitis Duodenitis is inflammation of the lining of the first part of your small intestine (duodenum). There are two types of duodenitis:  Acute duodenitis (develops suddenly and is short lived).   Chronic duodenitis (develops over an extended period and lasts months to years). CAUSES  Duodenitis is most often caused by infection with the bacterium Helicobacter pylori (H. pylori). H. pylori increases the production of stomach acid and causes changes in the environment of the duodenum. This irritates and damages the cells of the duodenum causing inflammation. Other causes of duodenitis include:   Long-term use of nonsteroidal anti-inflammatory drugs (NSAIDs). NSAIDs change the lining of the duodenum and make it more prone to injury from stomach acid.  Excessive use of alcohol. Alcohol increases stomach acid and changes the lining of the duodenum which makes it more likely for inflammation to develop.  Giardiasis. Giardiasis is a common infection of the small intestine. It can cause inflammation of the duodenum.   Other gastrointestinal disorders, such as Crohn disease. People with these disorders are more likely to develop duodenitis. SYMPTOMS  Although duodenitis does not always cause symptoms, symptoms that do occur include:  Nausea or vomiting.  Gassy, bloated feeling or an uncomfortable feeling of fullness after eating.  Burning, cramps, or pain in the upper abdominal area. DIAGNOSIS  To diagnose duodenitis, your health care provider may use results from:   An exam of the duodenum using a thin tube with a tiny camera on the tip, which is placed down your throat (endoscope). The endoscope is passed through your stomach and into your duodenum. Sometimes a sample of tissue from your  duodenum is removed with the endoscope. The sample is then examined under a microscope (biopsy) for signs of inflammation and H. pylori infection.   Tests that check samples of your blood or stool for H. pylori infection.   A test that checks the gases in a sample of your expired breath for H. pylori infection. The test measures the levels of carbon dioxide in your breath after you drink a special solution.  An X-ray exam using a special liquid that you swallow to illuminate your digestive tract (barium) to show signs of inflammation. TREATMENT  Treatment will depend on the cause of the duodenitis. The most common treatments include:  Use of medication to treat infection.  Medication to reduce stomach acid.  Discontinuing the use of NSAIDs.  Management of other gastrointestinal conditions.  Avoiding alcohol consumption. Additionally, taking the following steps can help to reduce the severity of your symptoms:  Drink enough water to keep your urine clear or pale yellow.  Avoid consuming these foods or drinks:  Caffeinated drinks.  Chocolate.  Peppermint or mint-flavored food or drinks.  Garlic.  Onions.  Spicy foods.  Citrus fruits, such as oranges, lemons, or limes.  Foods that use tomato-based sauces, such as pasta sauce, chili, salsa, and pizza.  Fatty foods.  Fried foods. Document Released: 06/19/2012 Document Revised: 07/09/2013 Document Reviewed: 06/19/2012 [facility] Patient Information 2015 [place], [place]. This information is not intended to replace advice given to you by your health care provider. Make sure you discuss any questions you have with your health care provider.

## 2014-05-10 NOTE — Progress Notes (Signed)
2 days of progressive RLQ pain.  Started intermittently, worse gradually in the last 24hours.  Nausea w/o vomiting.  No fevers.  Normal BMs.  SOB but really from pain- prev COPD exacerbation and pulmonary issues had resolved after treatment at last OV.  No urinary sx.  Compliant with baseline meds.   No abd pain except RLQ.  No back pain.    Meds, vitals, and allergies reviewed.   ROS: See HPI.  Otherwise, noncontributory.  Uncomfortable.  Speaking in complete sentences.   ncat Neck supple  RRR with occ ectopy, no ttachy No focal dec in BS, scant wheeze noted B- likely at baseline.  RLQ ttp, not ttp o/w.  No rebound Hypoactive BS Ext with 1+ edema

## 2014-05-10 NOTE — Patient Instructions (Signed)
Go to the ER directly.  Take care.

## 2014-05-10 NOTE — ED Provider Notes (Signed)
CSN: [id number]     Arrival date & time 05/10/14  1452 History   First MD Initiated Contact with Patient 05/10/14 1615     Chief Complaint  Patient presents with  . md sent - appendicitis      The history is provided by the patient. No language interpreter was used.   Alexandria [name] presents for evaluation of RLQ abdominal pain.  Pain started yesterday.  The pain is described as burning in nature.  The pain is constant, but at times gets worse.  It is worse with movement and better with holding her abdomen.  She denies any fevers, vomiting, dysuria.  She has nausea, diarrhea.  She saw her family doctor today and was referred to the ED for further evaluation.  She has a hx/o DM, COPD, hernia repair, cholecystectomy, hysterectomy.   Sxs are mild to moderate in nature.  Pt denies any hematochezia or melena.  Past Medical History  Diagnosis Date  . Depression 12/07/1998  . Hypothyroidism 03/08/1968  . Hypertension 03/08/1992  . Hyperlipidemia 10/04/2000  . Diabetes mellitus type II 02/05/2006    Dr. Elyse Hsu with endo  . COPD (chronic obstructive pulmonary disease) 03/08/1998    PFTs 12/12/2002 FEV 1 1.42 (64%) ratio 58 with no better after B2 and DLCO75%; PFTs 11/20/09 FEV1 1.50 (73%) ratio 50 no better after B2 with DLCO 62%; Hfa 50% 11/20/2009 >75%, 01/13/10 p coaching  . Ventral hernia   . Esophagitis   . Allergy, unspecified not elsewhere classified   . Disorders of bursae and tendons in shoulder region, unspecified     Rotator cuff syndrome, right  . Obesity     NOS  . Peripheral neuropathy     Likely due to DM per Dr. Erling Cruz  . OSA (obstructive sleep apnea)     PSG 01/27/10 AHI 13, pt does not know CPAP settings  . GERD (gastroesophageal reflux disease)    Past Surgical History  Procedure Laterality Date  . Tonsillectomy    . Rotator cuff repair  1984    Right, [place]  . Cesarean section      x2 Breech/ repeat  . Total abdominal hysterectomy  1985    Due to dysmennorhea  .  Cholecystectomy  1997  . Thumb release  12/1997    Right  . Carpal tunnel release  12/1997    Right  . Esophagogastroduodenoscopy  12/05/1997    Nml (due to hoarseness)  . Abd u/s  03/19/1999    Nml x2 foci in liver  . Ct abd w & pelvis wo/w cm      Abd hemangiomas of liver, 1 cm R renal cyst  . Cardiolite persantine  08/24/2000    Nml  . Carotid u/s  08/24/2000    1-39% ICA stenosis  . Dexa  07/03/2003    Nml  . Adenosine myoview  06/02/2007    Nml  . Carotid u/s  06/02/2007    No apprec change   . US echocardiography  06/02/2007  . Hernia repair  01/24/2009    Lap Ventr w/ Lysis of adhesions (Dr. Donne Hazel)  . Knee arthroscopic surgery  years ago    right  . Shoulder open rotator cuff repair  02/08/2012    Procedure: ROTATOR CUFF REPAIR SHOULDER OPEN;  Surgeon: Magnus Sinning, MD;  Location: [facility];  Service: Orthopedics;  Laterality: Left;  Left Shoulder Open Anterior Acrominectomy Rotator Cuff Repair Open Distal Clavicle Resection ,tissue mend graft, and repair of biceps tendon  Family History  Problem Relation Age of Onset  . Heart disease Mother   . Thyroid disease Mother   . Emphysema Father     One lung  . Breast cancer Sister   . Cystic fibrosis Sister   . Hyperthyroidism Sister   . Esophageal cancer Neg Hx   . Cancer Neg Hx     Head or neck  . Osteoarthritis Brother   . Hyperthyroidism Brother    History  Substance Use Topics  . Smoking status: Former Smoker -- 2.00 packs/day for 50 years    Types: Cigarettes    Quit date: 02/05/2006  . Smokeless tobacco: Never Used  . Alcohol Use: 1.0 oz/week    2 drink(s) per week     Comment: occasional   OB History    No data available     Review of Systems  All other systems reviewed and are negative.     Allergies  Clarithromycin; Codeine; and Sulfonamide derivatives  Home Medications   Prior to Admission medications   Medication Sig Start Date End Date Taking? Authorizing Provider  albuterol  (PROAIR HFA) 108 (90 BASE) MCG/ACT inhaler Inhale 2 puffs into the lungs every 6 (six) hours as needed for wheezing or shortness of breath.   Yes Historical Provider, MD  ALPRAZolam Duanne Moron) 0.5 MG tablet Take 0.5 tablets (0.25 mg total) by mouth 2 (two) times daily. As needed for anxiety 11/23/13  Yes Tonia Ghent, MD  aspirin 81 MG EC tablet Take 81 mg by mouth daily.     Yes Historical Provider, MD  budesonide-formoterol (SYMBICORT) 160-4.5 MCG/ACT inhaler INHALE 2 PUFFS FIRST THING IN THE MORNING AND 2 PUFFS AGAIN IN THE EVENING ABOUT 12 HOURS LATER 03/23/13  Yes Tonia Ghent, MD  Coenzyme Q-10 200 MG CAPS Take 1 capsule by mouth daily.     Yes Historical Provider, MD  Cyanocobalamin (B-12 PO) Take 1 tablet by mouth daily.   Yes Historical Provider, MD  EPINEPHrine (EPIPEN) 0.3 mg/0.3 mL DEVI Inject 0.3 mg into the muscle as directed.    Yes Historical Provider, MD  ergocalciferol (VITAMIN D2) 50000 UNITS capsule Take 50,000 Units by mouth once a week.     Yes Historical Provider, MD  ezetimibe (ZETIA) 10 MG tablet Take 1 tablet (10 mg total) by mouth at bedtime. 10/21/10  Yes Tammy S Parrett, NP  famotidine (PEPCID) 20 MG tablet Take 20 mg by mouth at bedtime.    Yes Historical Provider, MD  FLUoxetine (PROZAC) 10 MG capsule TAKE 2 CAPSULES IN THE MORNING AND TAKE 2 CAPSULES IN THE EVENING 04/13/14  Yes Tonia Ghent, MD  fluticasone (FLONASE) 50 MCG/ACT nasal spray USE 2 SPRAYS INTO THE NOSE EVERY 12 HOURS AS NEEDED FOR STUFFY NOSE 02/12/14  Yes Tonia Ghent, MD  ibuprofen (ADVIL,MOTRIN) 200 MG tablet Take 200 mg by mouth every 6 (six) hours as needed.   Yes Historical Provider, MD  levothyroxine (SYNTHROID, LEVOTHROID) 150 MCG tablet Take 150 mcg by mouth daily before breakfast. Can not take generic   Yes Historical Provider, MD  NEXIUM 40 MG capsule TAKE 1 CAPSULE DAILY 05/01/12  Yes Tonia Ghent, MD  rosuvastatin (CRESTOR) 10 MG tablet Take 1 tablet (10 mg total) by mouth at bedtime.  06/19/13  Yes Tonia Ghent, MD  sitaGLIPtin (JANUVIA) 100 MG tablet Take 1 tablet (100 mg total) by mouth daily. 10/21/10  Yes Tammy S Parrett, NP  valsartan (DIOVAN) 160 MG tablet Take 160 mg by  mouth daily before breakfast.   Yes Historical Provider, MD   BP 134/64 mmHg  Pulse 86  Temp(Src) 98.2 F (36.8 C) (Oral)  Resp 18  SpO2 98% Physical Exam  Constitutional: She is oriented to person, place, and time. She appears well-developed and well-nourished.  HENT:  Head: Normocephalic and atraumatic.  Cardiovascular: Normal rate and regular rhythm.   No murmur heard. Pulmonary/Chest: Effort normal and breath sounds normal. No respiratory distress.  Abdominal: Soft.  Moderate RLQ tenderness, no guarding or rebound  Musculoskeletal:  1+ nonpitting edema in BLE.  2+ femoral pulses in BLE  Neurological: She is alert and oriented to person, place, and time.  Skin: Skin is warm and dry.  Psychiatric: She has a normal mood and affect. Her behavior is normal.  Nursing note and vitals reviewed.   ED Course  Procedures (including critical care time) Labs Review Labs Reviewed  CBC WITH DIFFERENTIAL/PLATELET - Abnormal; Notable for the following:    WBC 10.6 (*)    Hemoglobin 9.9 (*)    HCT 34.9 (*)    MCH 22.3 (*)    MCHC 28.4 (*)    RDW 17.7 (*)    All other components within normal limits  COMPREHENSIVE METABOLIC PANEL - Abnormal; Notable for the following:    GFR calc non Af Amer 49 (*)    GFR calc Af Amer 57 (*)    All other components within normal limits  URINALYSIS, ROUTINE W REFLEX MICROSCOPIC - Abnormal; Notable for the following:    Hgb urine dipstick TRACE (*)    All other components within normal limits  URINE MICROSCOPIC-ADD ON    Imaging Review Ct Abdomen Pelvis W Contrast  05/10/2014   CLINICAL DATA:  Right lower quadrant abdominal pain with diarrhea for 2 days. Other medical history includes hypothyroidism, hyperlipidemia, COPD and ventral hernia. Initial  encounter.  EXAM: CT ABDOMEN AND PELVIS WITH CONTRAST  TECHNIQUE: Multidetector CT imaging of the abdomen and pelvis was performed using the standard protocol following bolus administration of intravenous contrast.  CONTRAST:  192mL OMNIPAQUE IOHEXOL 300 MG/ML  SOLN  COMPARISON:  Abdominal pelvic CT 01/30/2009.  FINDINGS: Lower chest: Mild emphysema and chronic scarring in both lung bases. No significant pleural or pericardial effusion. Mitral annular calcifications and aortic atherosclerosis noted.  Hepatobiliary: Peripherally enhancing slight exophytic mass in the lateral segment of the left hepatic lobe measures 3.4 cm on image 14, similar to the prior examination and most consistent with a hemangioma. No new or enlarging liver lesions identified. No significant biliary dilatation status post cholecystectomy.  Pancreas: Unremarkable. No pancreatic ductal dilatation or surrounding inflammatory changes.  Spleen: Normal in size without focal abnormality.  Adrenals/Urinary Tract: Both adrenal glands appear normal.Bilateral renal cysts grossly stable. No hydronephrosis or evidence of urinary tract calculus. The bladder appears unremarkable.  Stomach/Bowel: The stomach appears normal. There is proximal duodenal wall thickening with mild surrounding inflammatory change. There is no extraluminal fluid collection. The remainder of the small bowel appears normal. There is extensive diverticulosis throughout the colon, greatest distally. There is no surrounding inflammation.Retrocecal appendix appears normal.  Vascular/Lymphatic: There are no enlarged abdominal or pelvic lymph nodes. There is moderate atherosclerosis of the aorta, its branches and the iliac arteries.  Reproductive: Status post hysterectomy. No evidence of adnexal mass.  Other: Status post abdominal wall hernia repair. No recurrent hernia demonstrated. Previously demonstrated edema throughout the subcutaneous fat has largely resolved.  Musculoskeletal: No  acute or significant osseous findings. Mild lumbar spondylosis noted.  There are bilateral L5 pars defects with a resulting grade 1 anterolisthesis and mild biforaminal stenosis at L5-S1.  IMPRESSION: 1. Suspected wall thickening of the proximal duodenum, suggesting peptic ulcer disease or duodenitis. 2. No evidence of appendicitis, bowel obstruction or diverticulitis. There is extensive colonic diverticulosis. 3. Stable hepatic hemangioma and renal cysts. 4. Moderate atherosclerosis.   Electronically Signed   By: Richardean Sale M.D.   On: 05/10/2014 18:59     EKG Interpretation None      MDM   Final diagnoses:  Duodenitis    Pt here for evaluation of RLQ abdominal pain.  CT abd obtained to R/o appendicitis.  CT c/w duodenitis, no evidence of perforation or abscess.  D/w pt home care for duodenitis.  She stopped her nexium two weeks ago when she took a course of steroids for bronchitis.  Discussed restarting nexium, pcp follow up, return precautions.      Quintella Reichert, MD 05/10/14 [phone]

## 2014-05-30 ENCOUNTER — Other Ambulatory Visit: Payer: Self-pay | Admitting: Family Medicine

## 2014-05-30 NOTE — Telephone Encounter (Signed)
Received refill request electronically from pharmacy. Last refill 11/23/13 #90/1 refill, last office visit 05/10/14. Is it okay to refill medication?

## 2014-06-03 NOTE — Telephone Encounter (Signed)
Please call in.  Please schedule a physical for this summer.  Thanks

## 2014-06-03 NOTE — Telephone Encounter (Signed)
Medication phoned to pharmacy. Left detailed message on voicemail to schedule CPE this summer.

## 2014-06-12 ENCOUNTER — Other Ambulatory Visit: Payer: BLUE CROSS/BLUE SHIELD

## 2014-06-13 ENCOUNTER — Encounter: Payer: Self-pay | Admitting: Family Medicine

## 2014-06-13 ENCOUNTER — Ambulatory Visit (INDEPENDENT_AMBULATORY_CARE_PROVIDER_SITE_OTHER): Payer: BLUE CROSS/BLUE SHIELD | Admitting: Family Medicine

## 2014-06-13 ENCOUNTER — Ambulatory Visit (INDEPENDENT_AMBULATORY_CARE_PROVIDER_SITE_OTHER)
Admission: RE | Admit: 2014-06-13 | Discharge: 2014-06-13 | Disposition: A | Discharge status: Home / Self Care | Payer: BLUE CROSS/BLUE SHIELD | Source: Ambulatory Visit | Attending: Family Medicine | Admitting: Family Medicine

## 2014-06-13 ENCOUNTER — Telehealth: Payer: Self-pay | Admitting: Internal Medicine

## 2014-06-13 VITALS — BP 124/58 | HR 69 | Temp 98.5°F | Ht 64.0 in | Wt 224.8 lb

## 2014-06-13 DIAGNOSIS — Z0000 Encounter for general adult medical examination without abnormal findings: Secondary | ICD-10-CM | POA: Diagnosis not present

## 2014-06-13 DIAGNOSIS — J449 Chronic obstructive pulmonary disease, unspecified: Secondary | ICD-10-CM | POA: Diagnosis not present

## 2014-06-13 DIAGNOSIS — Z7189 Other specified counseling: Secondary | ICD-10-CM

## 2014-06-13 DIAGNOSIS — J441 Chronic obstructive pulmonary disease with (acute) exacerbation: Secondary | ICD-10-CM

## 2014-06-13 DIAGNOSIS — G4733 Obstructive sleep apnea (adult) (pediatric): Secondary | ICD-10-CM

## 2014-06-13 MED ORDER — BUDESONIDE-FORMOTEROL FUMARATE 160-4.5 MCG/ACT IN AERO: INHALATION_SPRAY | RESPIRATORY_TRACT | Status: DC

## 2014-06-13 MED ORDER — ALBUTEROL SULFATE HFA 108 (90 BASE) MCG/ACT IN AERS: 2.0000 | INHALATION_SPRAY | Freq: Four times a day (QID) | RESPIRATORY_TRACT | Status: DC | PRN

## 2014-06-13 NOTE — Telephone Encounter (Signed)
Okay with me 

## 2014-06-13 NOTE — Telephone Encounter (Signed)
Fine with me if ok with Dr Halford Chessman

## 2014-06-13 NOTE — Telephone Encounter (Signed)
Please advise Dr. Halford Chessman thanks

## 2014-06-13 NOTE — Patient Instructions (Signed)
Go to the lab on the way out.  We'll contact you with your xray report. Go see [name] on the way out.   Don't change your meds for now.   Take care.

## 2014-06-13 NOTE — Progress Notes (Signed)
Pre visit review using our clinic review tool, if applicable. No additional management support is needed unless otherwise documented below in the visit note.  I have personally reviewed the Medicare Annual Wellness questionnaire and have noted 1. The patient's medical and social history 2. Their use of alcohol, tobacco or illicit drugs 3. Their current medications and supplements 4. The patient's functional ability including ADL's, fall risks, home safety risks and hearing or visual             impairment. 5. Diet and physical activities 6. Evidence for depression or mood disorders  The patients weight, height, BMI have been recorded in the chart and visual acuity is per eye clinic.  I have made referrals, counseling and provided education to the patient based review of the above and I have provided the pt with a written personalized care plan for preventive services.  Provider list updated- see scanned forms.  Routine anticipatory guidance given to patient.  See health maintenance.  Flu 2015 Shingles 2007 PNA 2015 Tetanus 2012 Colonoscopy deferred for now until her pulmonary status is clarified.  D/w pt.  Breast cancer screening up to date DXA 2015  Advance directive- husband and daughter Adah Salvage equally designated if patient were incapacitated.  Cognitive function addressed- see scanned forms- and if abnormal then additional documentation follows.   Lipids, DM2, and thyroid disease per endo.  I'll defer to endo.   COPD.  Had seen pulmonary prev, not recently.  Wanted second opinion.  Compliant with inhalers.  She came in asking me to put her on oxygen.  She reports some SOB with exertion.  She has also been sleeping sitting up, but she doesn't have signs of fluid overload.  Denies CP.    OSA.  She hasn't been using CPAP.  She has fatigue and wakes from sleep not refreshed.  We talked about all of this, especially that her fatigue wouldn't likely get better w/o OSA treatment.   CPAP may not totally "fix" the fatigue, but I wouldn't expect it to get any better w/o OSA treatment.    PMH and SH reviewed  Meds, vitals, and allergies reviewed.   ROS: See HPI.  Otherwise negative.    GEN: nad, alert and oriented HEENT: mucous membranes moist NECK: supple w/o LA CV: rrr. PULM: ctab, no inc wob, no wheeze.  Speaking in complete sentences w/o inc in WOB.   ABD: soft, +bs EXT: trace, almost no, BLE edema SKIN: no acute rash

## 2014-06-13 NOTE — Telephone Encounter (Signed)
Dr. Melvyn Novas please advise thanks

## 2014-06-14 ENCOUNTER — Encounter: Payer: Self-pay | Admitting: Family Medicine

## 2014-06-14 DIAGNOSIS — Z7189 Other specified counseling: Secondary | ICD-10-CM | POA: Insufficient documentation

## 2014-06-14 DIAGNOSIS — Z0000 Encounter for general adult medical examination without abnormal findings: Secondary | ICD-10-CM | POA: Insufficient documentation

## 2014-06-14 NOTE — Assessment & Plan Note (Signed)
She hasn't been using CPAP.  She has fatigue and wakes from sleep not refreshed.  We talked about all of this, especially that her fatigue wouldn't likely get better w/o OSA treatment.  CPAP may not totally "fix" the fatigue, but I wouldn't expect it to get any better w/o OSA treatment.  We tried to get her pulmonary f/u, see COPD discussion.

## 2014-06-14 NOTE — Assessment & Plan Note (Signed)
Flu 2015 Shingles 2007 PNA 2015 Tetanus 2012 Colonoscopy deferred for now until her pulmonary status is clarified.  D/w pt.  Breast cancer screening up to date DXA 2015  Advance directive- husband and daughter Adah Salvage equally designated if patient were incapacitated.  Cognitive function addressed- see scanned forms- and if abnormal then additional documentation follows.

## 2014-06-14 NOTE — Assessment & Plan Note (Signed)
She is in no distress today, normal pulse ox, no wheeze, no focal dec in BS.   Had seen pulmonary prev, not recently.  Wanted second opinion.  Compliant with inhalers.  She came in asking me to put her on oxygen.  She reports some SOB with exertion.  She has also been sleeping sitting up, but she doesn't have signs of fluid overload.  Denies CP.   At this point, check CXR and refer back to pulmonary for second opinion.  She saw [name] on the way out of clinic.  She was offered appointment with pulm in [place], reportedly wasn't happy about the next available OV and declined appointment with another clinic (ie [facility] pulm in [place]).  Patient reported to [name] that she would consider and contact the office as needed.   I refilled her inhalers.  She doesn't appear to need oxygen at this point and is still okay for outpatient f/u.   At this point, I am waiting to hear back from the patient.

## 2014-06-14 NOTE — Telephone Encounter (Signed)
Pt is aware that [facility] will take over her pulmonary care. Nothing further was needed.

## 2014-06-18 ENCOUNTER — Telehealth: Payer: Self-pay | Admitting: Family Medicine

## 2014-06-18 NOTE — Telephone Encounter (Signed)
-----   Message from Josetta Huddle, [place] sent at 06/17/2014  3:47 PM EDT ----- Patient says she is using her CPAP religiously and does believe she feels better.

## 2014-06-18 NOTE — Telephone Encounter (Signed)
Noted. Thanks.

## 2014-06-19 DIAGNOSIS — D513 Other dietary vitamin B12 deficiency anemia: Secondary | ICD-10-CM | POA: Diagnosis not present

## 2014-06-19 DIAGNOSIS — I10 Essential (primary) hypertension: Secondary | ICD-10-CM | POA: Diagnosis not present

## 2014-06-19 DIAGNOSIS — E038 Other specified hypothyroidism: Secondary | ICD-10-CM | POA: Diagnosis not present

## 2014-06-19 DIAGNOSIS — E1142 Type 2 diabetes mellitus with diabetic polyneuropathy: Secondary | ICD-10-CM | POA: Diagnosis not present

## 2014-06-20 DIAGNOSIS — E559 Vitamin D deficiency, unspecified: Secondary | ICD-10-CM | POA: Diagnosis not present

## 2014-06-20 DIAGNOSIS — E1140 Type 2 diabetes mellitus with diabetic neuropathy, unspecified: Secondary | ICD-10-CM | POA: Diagnosis not present

## 2014-06-20 DIAGNOSIS — E1142 Type 2 diabetes mellitus with diabetic polyneuropathy: Secondary | ICD-10-CM | POA: Diagnosis not present

## 2014-06-20 DIAGNOSIS — E038 Other specified hypothyroidism: Secondary | ICD-10-CM | POA: Diagnosis not present

## 2014-06-20 DIAGNOSIS — E782 Mixed hyperlipidemia: Secondary | ICD-10-CM | POA: Diagnosis not present

## 2014-06-20 DIAGNOSIS — I10 Essential (primary) hypertension: Secondary | ICD-10-CM | POA: Diagnosis not present

## 2014-06-20 DIAGNOSIS — D513 Other dietary vitamin B12 deficiency anemia: Secondary | ICD-10-CM | POA: Diagnosis not present

## 2014-07-31 ENCOUNTER — Ambulatory Visit (INDEPENDENT_AMBULATORY_CARE_PROVIDER_SITE_OTHER)
Admission: RE | Admit: 2014-07-31 | Discharge: 2014-07-31 | Disposition: A | Discharge status: Home / Self Care | Payer: BLUE CROSS/BLUE SHIELD | Source: Ambulatory Visit | Attending: Pulmonary Disease | Admitting: Pulmonary Disease

## 2014-07-31 ENCOUNTER — Ambulatory Visit (INDEPENDENT_AMBULATORY_CARE_PROVIDER_SITE_OTHER): Payer: BLUE CROSS/BLUE SHIELD | Admitting: Pulmonary Disease

## 2014-07-31 ENCOUNTER — Encounter: Payer: Self-pay | Admitting: Pulmonary Disease

## 2014-07-31 ENCOUNTER — Encounter (INDEPENDENT_AMBULATORY_CARE_PROVIDER_SITE_OTHER): Payer: Self-pay

## 2014-07-31 VITALS — BP 154/80 | HR 86 | Ht 64.0 in | Wt 232.0 lb

## 2014-07-31 DIAGNOSIS — G4733 Obstructive sleep apnea (adult) (pediatric): Secondary | ICD-10-CM | POA: Diagnosis not present

## 2014-07-31 DIAGNOSIS — R0600 Dyspnea, unspecified: Secondary | ICD-10-CM

## 2014-07-31 DIAGNOSIS — J449 Chronic obstructive pulmonary disease, unspecified: Secondary | ICD-10-CM

## 2014-07-31 DIAGNOSIS — Z9989 Dependence on other enabling machines and devices: Secondary | ICD-10-CM

## 2014-07-31 NOTE — Progress Notes (Signed)
Chief Complaint  Patient presents with  . Sleep Consult    Pt c/o SOB with any activity, prod cough with clear thick mucus. Occasional chest tightness. Uses CPAP 3-4 hours nightly.  Epworth score: 16    History of Present Illness: Alexandria [name] is a 79 y.o. female for evaluation of dyspnea and sleep difficulties.  She has noticed trouble with her breathing for a while.  This has been getting worse.  She can now only walk about 100 ft before having to rest.  She has to use a wheelchair when at a concert or the mall.  She can no longer unload groceries from her car.  She gets very anxious when she is short of breath and gets the urge to use the bathroom.  She limits her activity because she is scared what might happen if she is out on her own.  She gets occasional cough, wheeze, and chest congestion.  She brings up clear sputum.  She denies hemoptysis, or chest pain.  She gets occasional flutter in her chest.  She had cardiac evaluation by Dr. Rockey Situ and was told her tests were okay.  She has previously been seen by Dr. Melvyn Novas or COPD.  She has been on symbicort and proair.  She had previously been on advair and spiriva.  These help some.  She started smoking at age 27, smoked 2 ppd, and quit in 2006.    She has lived in [place] since [place].  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  She has a Programmer, systems.  She had a sleep study in 2011 which showed mild sleep apnea.  She was started on CPAP, but has not received any new supplies since original set up.  She does not feel like her machine gives her enough pressure anymore.  As a result she feels more sleepy during the day.   She goes to bed at 11 pm.  She falls asleep after 30 minutes.  She wakes up 1 or 2 times to use the bathroom.  She gets out of bed at 10 am.  She still feels tired in the morning.  She is not using anything to help her sleep at night or stay awake during the day.  She denies history of sleep walking, sleep  talking, or nightmares.  There is no history of restless leg symptoms.  She denies sleep hallucinations, sleep paralysis, or cataplexy.  Her Epworth score is 16 out of 24.  Tests: PSG 01/27/10 >> AHI 13, SpO2 low 73% Echo 02/05/10 >> EF 50 to 32%, grade 1 diastolic dysfx RAST 9/51/88 >> negative, IgE 8.1 A1AT 04/15/11 >> MM PFT 10/512 >> FEV1 1.29 (61%), FEV1% 57, TLC 6.01 (119%), RV 3.52 (153%), DLCO 67%, no BD  Alexandria [name]  has a past medical history of Depression (12/07/1998); Hypothyroidism (03/08/1968); Hypertension (03/08/1992); Hyperlipidemia (10/04/2000); Diabetes mellitus type II (02/05/2006); COPD (chronic obstructive pulmonary disease) (03/08/1998); Ventral hernia; Esophagitis; Allergy, unspecified not elsewhere classified; Disorders of bursae and tendons in shoulder region, unspecified; Obesity; Peripheral neuropathy; OSA (obstructive sleep apnea); and GERD (gastroesophageal reflux disease).  Alexandria [name]  has past surgical history that includes Tonsillectomy; Rotator cuff repair (1984); Cesarean section; Total abdominal hysterectomy (1985); Cholecystectomy (1997); THUMB RELEASE (12/1997); Carpal tunnel release (12/1997); Esophagogastroduodenoscopy (12/05/1997); ABD U/S (03/19/1999); ct abd w & pelvis wo/w cm; CARDIOLITE PERSANTINE (08/24/2000); CAROTID U/S (08/24/2000); DEXA (07/03/2003); ADENOSINE MYOVIEW (06/02/2007); CAROTID U/S (06/02/2007); US echocardiography (06/02/2007); Hernia repair (01/24/2009); knee arthroscopic surgery (years ago);  Shoulder open rotator cuff repair (02/08/2012); and Gallbladder surgery.  Prior to Admission medications   Medication Sig Start Date End Date Taking? Authorizing Provider  albuterol (PROAIR HFA) 108 (90 BASE) MCG/ACT inhaler Inhale 2 puffs into the lungs every 6 (six) hours as needed for wheezing or shortness of breath. 06/13/14  Yes Tonia Ghent, MD  ALPRAZolam Duanne Moron) 0.5 MG tablet TAKE ONE-HALF TABLET BY MOUTH TWICE A DAY AS NEEDED  06/03/14  Yes Tonia Ghent, MD  aspirin 81 MG EC tablet Take 81 mg by mouth daily.     Yes Historical Provider, MD  budesonide-formoterol (SYMBICORT) 160-4.5 MCG/ACT inhaler INHALE 2 PUFFS FIRST THING IN THE MORNING AND 2 PUFFS AGAIN IN THE EVENING ABOUT 12 HOURS LATER 06/13/14  Yes Tonia Ghent, MD  Coenzyme Q-10 200 MG CAPS Take 1 capsule by mouth daily.     Yes Historical Provider, MD  Cyanocobalamin (B-12 PO) Take 1 tablet by mouth daily.   Yes Historical Provider, MD  EPINEPHrine (EPIPEN) 0.3 mg/0.3 mL DEVI Inject 0.3 mg into the muscle as directed.    Yes Historical Provider, MD  ergocalciferol (VITAMIN D2) 50000 UNITS capsule Take 50,000 Units by mouth once a week.     Yes Historical Provider, MD  ezetimibe (ZETIA) 10 MG tablet Take 1 tablet (10 mg total) by mouth at bedtime. 10/21/10  Yes Tammy S Parrett, NP  famotidine (PEPCID) 20 MG tablet Take 20 mg by mouth at bedtime.    Yes Historical Provider, MD  FLUoxetine (PROZAC) 10 MG capsule TAKE 2 CAPSULES IN THE MORNING AND TAKE 2 CAPSULES IN THE EVENING 04/13/14  Yes Tonia Ghent, MD  fluticasone (FLONASE) 50 MCG/ACT nasal spray USE 2 SPRAYS INTO THE NOSE EVERY 12 HOURS AS NEEDED FOR STUFFY NOSE 02/12/14  Yes Tonia Ghent, MD  ibuprofen (ADVIL,MOTRIN) 200 MG tablet Take 200 mg by mouth every 6 (six) hours as needed.   Yes Historical Provider, MD  levothyroxine (SYNTHROID, LEVOTHROID) 150 MCG tablet Take 150 mcg by mouth daily before breakfast. Can not take generic   Yes Historical Provider, MD  NEXIUM 40 MG capsule TAKE 1 CAPSULE DAILY 05/01/12  Yes Tonia Ghent, MD  rosuvastatin (CRESTOR) 10 MG tablet Take 1 tablet (10 mg total) by mouth at bedtime. 06/19/13  Yes Tonia Ghent, MD  sitaGLIPtin (JANUVIA) 100 MG tablet Take 1 tablet (100 mg total) by mouth daily. 10/21/10  Yes Tammy S Parrett, NP  valsartan (DIOVAN) 160 MG tablet Take 160 mg by mouth daily before breakfast.   Yes Historical Provider, MD    Allergies  Allergen  Reactions  . Clarithromycin     REACTION: diff swallowing and mouth blisters  . Codeine Other (See Comments)    Unknown   . Spiriva Handihaler [Tiotropium Bromide Monohydrate] Other (See Comments)    Voice changes  . Sulfonamide Derivatives     REACTION: closed throat    Her family history includes Breast cancer in her sister; Cystic fibrosis in her sister; Emphysema in her father; Heart disease in her mother; Hyperthyroidism in her brother and sister; Osteoarthritis in her brother; Thyroid disease in her mother. There is no history of Esophageal cancer or Cancer.  She  reports that she quit smoking about 8 years ago. Her smoking use included Cigarettes. She has a 100 pack-year smoking history. She has never used smokeless tobacco. She reports that she drinks about 1.0 oz of alcohol per week. She reports that she does not use illicit  drugs.   Physical Exam: Blood pressure 154/80, pulse 86, height '5\' 4"'$  (1.626 m), weight 232 lb (105.235 kg), SpO2 97 %. Body mass index is 39.8 kg/(m^2).  General - No distress ENT - No sinus tenderness, no oral exudate, no LAN, no thyromegaly, TM clear, pupils equal/reactive, MP 3 Cardiac - s1s2 regular, no murmur, pulses symmetric Chest - No wheeze/rales/dullness, good air entry, normal respiratory excursion Back - No focal tenderness Abd - Soft, non-tender, no organomegaly, + bowel sounds Ext - 1+ ankle edema Neuro - Normal strength, cranial nerves intact Skin - No rashes Psych - Normal mood, and behavior  Discussion: She has hx of COPD and OSA.  She has progressive dyspnea with exertion.  She is obese and has steady decrease in her exercise level.  She has been on CPAP, but has not had contact with a DME since initial set up in 2011.  Assessment/plan:  Dyspnea >> likely related to COPD, obesity, and deconditioning. Plan: - will get chest xray and pulmonary function testing  COPD. Plan: - continue symbicort and prn proair for  now  Obstructive sleep apnea. Plan: - will set her up for new DME and get new CPAP supplies - she might be eligible for new CPAP machine if her current machine is > 5 yrs old - will get CPAP download and arrange for ONO with CPAP and room air  Obesity. Plan: - discussed importance of weight loss, and how her weight is impacting her breathing  Hypertension. Her blood pressure was elevated today. Plan: - she is to f/u with her PCP   Chesley Mires, M.D. Pager [phone]

## 2014-07-31 NOTE — Patient Instructions (Addendum)
Based on breathing test from 2014 you had GOLD 2 COPD Chest xray today Will arrange for repeat pulmonary function test Will arrange for new home care company for CPAP machine, new supplies, and get report from CPAP machine Will arrange for overnight oxygen test with you using CPAP machine Follow up in 8 weeks

## 2014-07-31 NOTE — Progress Notes (Signed)
   Subjective:    Patient ID: BRIAHNA PESCADOR, female    DOB: [birth year], 79 y.o.   MRN: [id number]  HPI    Review of Systems  Constitutional: Negative for fever and unexpected weight change.  HENT: Negative for congestion, dental problem, ear pain, nosebleeds, postnasal drip, rhinorrhea, sinus pressure, sneezing, sore throat and trouble swallowing.   Eyes: Positive for itching. Negative for redness.  Respiratory: Positive for cough, chest tightness, shortness of breath and wheezing.   Cardiovascular: Negative for palpitations and leg swelling.  Gastrointestinal: Negative for nausea and vomiting.  Genitourinary: Negative for dysuria.  Musculoskeletal: Negative for joint swelling.  Skin: Negative for rash.  Neurological: Positive for headaches.  Hematological: Does not bruise/bleed easily.  Psychiatric/Behavioral: Negative for dysphoric mood. The patient is not nervous/anxious.        Objective:   Physical Exam        Assessment & Plan:

## 2014-08-02 ENCOUNTER — Telehealth: Payer: Self-pay | Admitting: Pulmonary Disease

## 2014-08-02 NOTE — Telephone Encounter (Signed)
Dg Chest 2 View  08/01/2014   CLINICAL DATA:  COPD, history of previous tobacco use, now with dyspnea on exertion in chest tightness and lightheadedness  EXAM: CHEST  2 VIEW  COMPARISON:  PA and lateral chest of June 13, 2014 and PA and lateral chest x-ray of October 10, 2012.  FINDINGS: The lungs remain mildly hyperinflated. There is no focal infiltrate. There is stable nodularity at the left lung base. There is no pleural effusion. The heart and pulmonary vascularity are normal. The mediastinum is normal in width. There is tortuosity of the descending thoracic aorta. The bony thorax is unremarkable.  IMPRESSION: COPD.  There is no active cardiopulmonary disease.   Electronically Signed   By: David  Martinique M.D.   On: 08/01/2014 08:19    Will have my nurse inform pt that CXR shows expected changes from COPD.  No other findings.  No change to current treatment plan.

## 2014-08-07 ENCOUNTER — Telehealth: Payer: Self-pay | Admitting: Pulmonary Disease

## 2014-08-07 DIAGNOSIS — J449 Chronic obstructive pulmonary disease, unspecified: Secondary | ICD-10-CM

## 2014-08-07 DIAGNOSIS — R0600 Dyspnea, unspecified: Secondary | ICD-10-CM

## 2014-08-07 NOTE — Telephone Encounter (Signed)
Results have been explained to patient, pt expressed understanding. See telephone note 08/07/14 for appt changes(PFT). Nothing further needed.

## 2014-08-07 NOTE — Telephone Encounter (Signed)
Pt seen by VS on 5.25.16 Patient Instructions       Based on breathing test from 2014 you had GOLD 2 COPD Chest xray today Will arrange for repeat pulmonary function test Will arrange for new home care company for CPAP machine, new supplies, and get report from CPAP machine Will arrange for overnight oxygen test with you using CPAP machine Follow up in 8 weeks    Pt called stating that she will be done with all of her ordered testing by next week She is due for her follow up and PFT on 7.25.16 w/ VS Pt is requesting to be worked in prior to this date (in June preferrably) to discuss all her results.  She is okay with rescheduling her PFT and is okay with doing this test on a different date than her appt and/or at the hospital.  Dr Halford Chessman, please advise if pt may be worked in with you sooner than her 7.25.16 appt.  Pt was adamant about seeing VS only.

## 2014-08-07 NOTE — Telephone Encounter (Signed)
Spoke with patient to give results of CXR - pt aware that the best option will be to keep her appt as scheduled with Dr Halford Chessman 09/30/14 and we can move her PFT to a sooner date and call her with the results. Pt was okay with doing this. Pt aware that her PFT will need to be scheduled at the hospital [facility]). Aware that I will call her with a date/time. Pt requests the appt be scheduled for anytime after 12pm the week of 6/6-6/10.   Called [facility] PFT Dept, they are closed. Will call in AM.

## 2014-08-07 NOTE — Telephone Encounter (Signed)
Okay to double book visit wherever you can find something.

## 2014-08-08 ENCOUNTER — Encounter: Payer: Self-pay | Admitting: Pulmonary Disease

## 2014-08-08 NOTE — Telephone Encounter (Signed)
Pt called back & I gave her new pft appt info. I cancelled pft that was scheduled in our office in July. Nothing further needed.

## 2014-08-08 NOTE — Telephone Encounter (Signed)
Called PFT Dept at [facility] - scheduled 08/15/14 @ 2pm ( arrive at 1:45 to check, no smoking, no caffeine, no albuterol 4 hours prior) Can call [phone] to cancel or reschedule.   LMTCB x 1 for pt to make aware of new date/time of PFT

## 2014-08-09 DIAGNOSIS — G4733 Obstructive sleep apnea (adult) (pediatric): Secondary | ICD-10-CM | POA: Diagnosis not present

## 2014-08-09 DIAGNOSIS — R0902 Hypoxemia: Secondary | ICD-10-CM | POA: Diagnosis not present

## 2014-08-15 ENCOUNTER — Ambulatory Visit (HOSPITAL_COMMUNITY)
Admission: RE | Admit: 2014-08-15 | Discharge: 2014-08-15 | Disposition: A | Discharge status: Home / Self Care | Payer: BLUE CROSS/BLUE SHIELD | Source: Ambulatory Visit | Attending: Pulmonary Disease | Admitting: Pulmonary Disease

## 2014-08-15 DIAGNOSIS — J449 Chronic obstructive pulmonary disease, unspecified: Secondary | ICD-10-CM

## 2014-08-15 DIAGNOSIS — R0600 Dyspnea, unspecified: Secondary | ICD-10-CM | POA: Insufficient documentation

## 2014-08-15 LAB — PULMONARY FUNCTION TEST
DL/VA % pred: 70 %
DL/VA: 3.39 ml/min/mmHg/L
DLCO unc % pred: 47 %
DLCO unc: 11.56 ml/min/mmHg
FEF 25-75 Post: 0.42 L/sec
FEF 25-75 Pre: 0.32 L/sec
FEF2575-%Change-Post: 29 %
FEF2575-%Pred-Post: 26 %
FEF2575-%Pred-Pre: 20 %
FEV1-%Change-Post: 6 %
FEV1-%Pred-Post: 52 %
FEV1-%Pred-Pre: 49 %
FEV1-Post: 1.07 L
FEV1-Pre: 1.01 L
FEV1FVC-%Change-Post: 4 %
FEV1FVC-%Pred-Pre: 71 %
FEV6-%Change-Post: 5 %
FEV6-%Pred-Post: 70 %
FEV6-%Pred-Pre: 66 %
FEV6-Post: 1.82 L
FEV6-Pre: 1.73 L
FEV6FVC-%Change-Post: 3 %
FEV6FVC-%Pred-Post: 98 %
FEV6FVC-%Pred-Pre: 94 %
FVC-%Change-Post: 1 %
FVC-%Pred-Post: 71 %
FVC-%Pred-Pre: 70 %
FVC-Post: 1.95 L
FVC-Pre: 1.92 L
Post FEV1/FVC ratio: 55 %
Post FEV6/FVC ratio: 93 %
Pre FEV1/FVC ratio: 53 %
Pre FEV6/FVC Ratio: 90 %
RV % pred: 188 %
RV: 4.4 L
TLC % pred: 128 %
TLC: 6.49 L

## 2014-08-15 MED ORDER — ALBUTEROL SULFATE (2.5 MG/3ML) 0.083% IN NEBU
2.5000 mg | INHALATION_SOLUTION | Freq: Once | RESPIRATORY_TRACT | Status: AC
  Administered 2014-08-15: 2.5 mg via RESPIRATORY_TRACT

## 2014-08-16 ENCOUNTER — Telehealth: Payer: Self-pay | Admitting: Pulmonary Disease

## 2014-08-16 NOTE — Telephone Encounter (Signed)
PFT 08/15/14 >> FEV1 1.07 (52%), FEV1% 55, TLC 6.49 (128%), DLCO 47%, no BD  Will have my nurse inform pt that PFT showed changes of COPD.  She should continue symbicort and prn albuterol.  Will discuss in more detail at next ROV.

## 2014-08-20 NOTE — Telephone Encounter (Signed)
Results have been explained to patient, pt expressed understanding.  Patient is requesting ONO results. Please advise Dr Halford Chessman if these are available. Thanks.

## 2014-08-21 NOTE — Telephone Encounter (Signed)
LVM for pt to return call

## 2014-08-21 NOTE — Telephone Encounter (Signed)
ONO with CPAP 08/09/14 >> Test time 9 hr 16 min.  Mean SpO2 92.2%, low SpO2 86%.  Spent 10 min with SpO2 < 88%.   Will have my nurse inform pt that ONO looked okay.  She does not need supplemental oxygen at night.  She should continue CPAP while asleep.

## 2014-08-21 NOTE — Telephone Encounter (Signed)
Pt returned call [phone]

## 2014-08-21 NOTE — Telephone Encounter (Signed)
Pt is aware of ONO results. 

## 2014-09-05 DIAGNOSIS — F419 Anxiety disorder, unspecified: Secondary | ICD-10-CM | POA: Diagnosis not present

## 2014-09-05 DIAGNOSIS — R220 Localized swelling, mass and lump, head: Secondary | ICD-10-CM | POA: Diagnosis not present

## 2014-09-05 DIAGNOSIS — E119 Type 2 diabetes mellitus without complications: Secondary | ICD-10-CM | POA: Diagnosis not present

## 2014-09-05 DIAGNOSIS — I10 Essential (primary) hypertension: Secondary | ICD-10-CM | POA: Diagnosis not present

## 2014-09-05 DIAGNOSIS — J449 Chronic obstructive pulmonary disease, unspecified: Secondary | ICD-10-CM | POA: Diagnosis not present

## 2014-09-05 DIAGNOSIS — E039 Hypothyroidism, unspecified: Secondary | ICD-10-CM | POA: Diagnosis not present

## 2014-09-05 DIAGNOSIS — R064 Hyperventilation: Secondary | ICD-10-CM | POA: Diagnosis not present

## 2014-09-05 DIAGNOSIS — E785 Hyperlipidemia, unspecified: Secondary | ICD-10-CM | POA: Diagnosis not present

## 2014-09-05 DIAGNOSIS — Z882 Allergy status to sulfonamides status: Secondary | ICD-10-CM | POA: Diagnosis not present

## 2014-09-05 DIAGNOSIS — G4733 Obstructive sleep apnea (adult) (pediatric): Secondary | ICD-10-CM | POA: Diagnosis not present

## 2014-09-12 ENCOUNTER — Encounter: Payer: Self-pay | Admitting: Pulmonary Disease

## 2014-09-17 DIAGNOSIS — E038 Other specified hypothyroidism: Secondary | ICD-10-CM | POA: Diagnosis not present

## 2014-09-17 DIAGNOSIS — E782 Mixed hyperlipidemia: Secondary | ICD-10-CM | POA: Diagnosis not present

## 2014-09-17 DIAGNOSIS — E1140 Type 2 diabetes mellitus with diabetic neuropathy, unspecified: Secondary | ICD-10-CM | POA: Diagnosis not present

## 2014-09-17 DIAGNOSIS — D513 Other dietary vitamin B12 deficiency anemia: Secondary | ICD-10-CM | POA: Diagnosis not present

## 2014-09-17 DIAGNOSIS — E559 Vitamin D deficiency, unspecified: Secondary | ICD-10-CM | POA: Diagnosis not present

## 2014-09-18 DIAGNOSIS — E559 Vitamin D deficiency, unspecified: Secondary | ICD-10-CM | POA: Diagnosis not present

## 2014-09-18 DIAGNOSIS — E782 Mixed hyperlipidemia: Secondary | ICD-10-CM | POA: Diagnosis not present

## 2014-09-18 DIAGNOSIS — E1140 Type 2 diabetes mellitus with diabetic neuropathy, unspecified: Secondary | ICD-10-CM | POA: Diagnosis not present

## 2014-09-18 DIAGNOSIS — M109 Gout, unspecified: Secondary | ICD-10-CM | POA: Diagnosis not present

## 2014-09-18 DIAGNOSIS — E038 Other specified hypothyroidism: Secondary | ICD-10-CM | POA: Diagnosis not present

## 2014-09-18 DIAGNOSIS — I10 Essential (primary) hypertension: Secondary | ICD-10-CM | POA: Diagnosis not present

## 2014-09-18 DIAGNOSIS — D513 Other dietary vitamin B12 deficiency anemia: Secondary | ICD-10-CM | POA: Diagnosis not present

## 2014-09-18 DIAGNOSIS — E1142 Type 2 diabetes mellitus with diabetic polyneuropathy: Secondary | ICD-10-CM | POA: Diagnosis not present

## 2014-09-26 ENCOUNTER — Telehealth: Payer: Self-pay

## 2014-09-26 NOTE — Telephone Encounter (Signed)
Patient says she has taken Tylenol and she is actually feeling better and doesn't believe her fever is that high now.  Patient advised that if her fever goes up to 102 again, she should be seen in UC or ER, otherwise, appt is scheduled tomorrow.

## 2014-09-26 NOTE — Telephone Encounter (Signed)
PLEASE NOTE: All timestamps contained within this report are represented as [place] Standard Time. CONFIDENTIALTY NOTICE: This fax transmission is intended only for the addressee. It contains information that is legally privileged, confidential or otherwise protected from use or disclosure. If you are not the intended recipient, you are strictly prohibited from reviewing, disclosing, copying using or disseminating any of this information or taking any action in reliance on or regarding this information. If you have received this fax in error, please notify us immediately by telephone so that we can arrange for its return to [place]. Phone: [phone], Toll-Free: [phone], Fax: [phone] Page: 1 of 1 Call Id: [id number] [place] Patient Name: Alexandria [name] Gender: Female DOB: 25-Feb-1936 Age: 79 Y 23 D Return Phone Number: [phone] (Primary) Address: City/State/Zip: [place] Client [place] [facility] - Client Client Site [facility] Renford Dills Contact Type Call Caller Name [name] Phone Number [phone] Relationship To Patient Daughter Is this call to report lab results? No Call Type General Information Initial Comment Caller states she was trying to make an appointment for her mom. General Information Type Appointment Nurse Assessment Guidelines Guideline Title Affirmed Question Affirmed Notes Nurse Date/Time (Eastern Time) Disp. Time [name] Time) Disposition Final User 09/26/2014 3:51:36 PM General Information Provided Yes Haynes Bast After Care Instructions Given Call Event Type User Date / Time Description

## 2014-09-26 NOTE — Telephone Encounter (Signed)
Please call and check on this to make sure that she doesn't need to be seen tonight, UC or ER.

## 2014-09-26 NOTE — Telephone Encounter (Signed)
Pt has appt withDr [name] 09/27/14 at 12 noon.

## 2014-09-27 ENCOUNTER — Inpatient Hospital Stay (HOSPITAL_COMMUNITY)
Admission: EM | Admit: 2014-09-27 | Discharge: 2014-10-02 | DRG: 872 | Disposition: A | Discharge status: Skilled Nursing Facility | Payer: BLUE CROSS/BLUE SHIELD | Source: Ambulatory Visit | Attending: Internal Medicine | Admitting: Internal Medicine

## 2014-09-27 ENCOUNTER — Emergency Department (HOSPITAL_COMMUNITY): Payer: BLUE CROSS/BLUE SHIELD

## 2014-09-27 ENCOUNTER — Encounter (HOSPITAL_COMMUNITY): Payer: Self-pay | Admitting: Emergency Medicine

## 2014-09-27 ENCOUNTER — Telehealth: Payer: Self-pay | Admitting: Pulmonary Disease

## 2014-09-27 ENCOUNTER — Ambulatory Visit (INDEPENDENT_AMBULATORY_CARE_PROVIDER_SITE_OTHER): Payer: BLUE CROSS/BLUE SHIELD | Admitting: Family Medicine

## 2014-09-27 ENCOUNTER — Encounter: Payer: Self-pay | Admitting: Family Medicine

## 2014-09-27 VITALS — BP 66/42 | HR 99 | Temp 99.1°F

## 2014-09-27 DIAGNOSIS — E1165 Type 2 diabetes mellitus with hyperglycemia: Secondary | ICD-10-CM | POA: Diagnosis present

## 2014-09-27 DIAGNOSIS — I10 Essential (primary) hypertension: Secondary | ICD-10-CM | POA: Diagnosis present

## 2014-09-27 DIAGNOSIS — J438 Other emphysema: Secondary | ICD-10-CM | POA: Diagnosis not present

## 2014-09-27 DIAGNOSIS — E1140 Type 2 diabetes mellitus with diabetic neuropathy, unspecified: Secondary | ICD-10-CM | POA: Diagnosis present

## 2014-09-27 DIAGNOSIS — R531 Weakness: Secondary | ICD-10-CM | POA: Diagnosis not present

## 2014-09-27 DIAGNOSIS — J441 Chronic obstructive pulmonary disease with (acute) exacerbation: Secondary | ICD-10-CM | POA: Diagnosis not present

## 2014-09-27 DIAGNOSIS — D62 Acute posthemorrhagic anemia: Secondary | ICD-10-CM | POA: Diagnosis present

## 2014-09-27 DIAGNOSIS — Z882 Allergy status to sulfonamides status: Secondary | ICD-10-CM | POA: Diagnosis not present

## 2014-09-27 DIAGNOSIS — I959 Hypotension, unspecified: Secondary | ICD-10-CM | POA: Insufficient documentation

## 2014-09-27 DIAGNOSIS — N289 Disorder of kidney and ureter, unspecified: Secondary | ICD-10-CM | POA: Diagnosis not present

## 2014-09-27 DIAGNOSIS — N179 Acute kidney failure, unspecified: Secondary | ICD-10-CM | POA: Diagnosis not present

## 2014-09-27 DIAGNOSIS — B9620 Unspecified Escherichia coli [E. coli] as the cause of diseases classified elsewhere: Secondary | ICD-10-CM | POA: Diagnosis present

## 2014-09-27 DIAGNOSIS — K219 Gastro-esophageal reflux disease without esophagitis: Secondary | ICD-10-CM | POA: Diagnosis present

## 2014-09-27 DIAGNOSIS — N3000 Acute cystitis without hematuria: Secondary | ICD-10-CM | POA: Diagnosis present

## 2014-09-27 DIAGNOSIS — E039 Hypothyroidism, unspecified: Secondary | ICD-10-CM | POA: Diagnosis present

## 2014-09-27 DIAGNOSIS — D649 Anemia, unspecified: Secondary | ICD-10-CM | POA: Diagnosis not present

## 2014-09-27 DIAGNOSIS — Z79899 Other long term (current) drug therapy: Secondary | ICD-10-CM | POA: Diagnosis not present

## 2014-09-27 DIAGNOSIS — Z885 Allergy status to narcotic agent status: Secondary | ICD-10-CM | POA: Diagnosis not present

## 2014-09-27 DIAGNOSIS — Z7982 Long term (current) use of aspirin: Secondary | ICD-10-CM

## 2014-09-27 DIAGNOSIS — E038 Other specified hypothyroidism: Secondary | ICD-10-CM | POA: Diagnosis present

## 2014-09-27 DIAGNOSIS — R0602 Shortness of breath: Secondary | ICD-10-CM

## 2014-09-27 DIAGNOSIS — R404 Transient alteration of awareness: Secondary | ICD-10-CM | POA: Diagnosis not present

## 2014-09-27 DIAGNOSIS — I9589 Other hypotension: Secondary | ICD-10-CM | POA: Diagnosis present

## 2014-09-27 DIAGNOSIS — F411 Generalized anxiety disorder: Secondary | ICD-10-CM | POA: Diagnosis not present

## 2014-09-27 DIAGNOSIS — Z888 Allergy status to other drugs, medicaments and biological substances status: Secondary | ICD-10-CM | POA: Diagnosis not present

## 2014-09-27 DIAGNOSIS — D509 Iron deficiency anemia, unspecified: Secondary | ICD-10-CM | POA: Diagnosis present

## 2014-09-27 DIAGNOSIS — A419 Sepsis, unspecified organism: Principal | ICD-10-CM | POA: Diagnosis present

## 2014-09-27 DIAGNOSIS — E861 Hypovolemia: Secondary | ICD-10-CM | POA: Diagnosis present

## 2014-09-27 DIAGNOSIS — G4733 Obstructive sleep apnea (adult) (pediatric): Secondary | ICD-10-CM | POA: Diagnosis present

## 2014-09-27 DIAGNOSIS — R509 Fever, unspecified: Secondary | ICD-10-CM | POA: Diagnosis not present

## 2014-09-27 DIAGNOSIS — Z6841 Body Mass Index (BMI) 40.0 and over, adult: Secondary | ICD-10-CM | POA: Diagnosis not present

## 2014-09-27 DIAGNOSIS — E785 Hyperlipidemia, unspecified: Secondary | ICD-10-CM | POA: Diagnosis present

## 2014-09-27 DIAGNOSIS — Z9989 Dependence on other enabling machines and devices: Secondary | ICD-10-CM

## 2014-09-27 DIAGNOSIS — R001 Bradycardia, unspecified: Secondary | ICD-10-CM | POA: Diagnosis not present

## 2014-09-27 DIAGNOSIS — R7881 Bacteremia: Secondary | ICD-10-CM | POA: Diagnosis present

## 2014-09-27 DIAGNOSIS — E662 Morbid (severe) obesity with alveolar hypoventilation: Secondary | ICD-10-CM | POA: Diagnosis not present

## 2014-09-27 DIAGNOSIS — N390 Urinary tract infection, site not specified: Secondary | ICD-10-CM | POA: Diagnosis not present

## 2014-09-27 DIAGNOSIS — E119 Type 2 diabetes mellitus without complications: Secondary | ICD-10-CM | POA: Diagnosis not present

## 2014-09-27 DIAGNOSIS — Z881 Allergy status to other antibiotic agents status: Secondary | ICD-10-CM | POA: Diagnosis not present

## 2014-09-27 DIAGNOSIS — F329 Major depressive disorder, single episode, unspecified: Secondary | ICD-10-CM | POA: Diagnosis present

## 2014-09-27 DIAGNOSIS — Z87891 Personal history of nicotine dependence: Secondary | ICD-10-CM | POA: Diagnosis not present

## 2014-09-27 DIAGNOSIS — I5032 Chronic diastolic (congestive) heart failure: Secondary | ICD-10-CM | POA: Diagnosis present

## 2014-09-27 DIAGNOSIS — I5033 Acute on chronic diastolic (congestive) heart failure: Secondary | ICD-10-CM | POA: Diagnosis present

## 2014-09-27 HISTORY — DX: Chronic diastolic (congestive) heart failure: I50.32

## 2014-09-27 LAB — MAGNESIUM: Magnesium: 1.9 mg/dL (ref 1.7–2.4)

## 2014-09-27 LAB — FERRITIN: Ferritin: 15 ng/mL (ref 11–307)

## 2014-09-27 LAB — GLUCOSE, CAPILLARY: Glucose-Capillary: 146 mg/dL — ABNORMAL HIGH (ref 65–99)

## 2014-09-27 LAB — CORTISOL: Cortisol, Plasma: 12.6 ug/dL

## 2014-09-27 LAB — IRON AND TIBC
Iron: 6 ug/dL — ABNORMAL LOW (ref 28–170)
Saturation Ratios: 2 % — ABNORMAL LOW (ref 10.4–31.8)
TIBC: 316 ug/dL (ref 250–450)
UIBC: 310 ug/dL

## 2014-09-27 LAB — COMPREHENSIVE METABOLIC PANEL
ALT: 13 U/L — ABNORMAL LOW (ref 14–54)
AST: 18 U/L (ref 15–41)
Albumin: 2.8 g/dL — ABNORMAL LOW (ref 3.5–5.0)
Alkaline Phosphatase: 72 U/L (ref 38–126)
Anion gap: 4 — ABNORMAL LOW (ref 5–15)
BUN: 24 mg/dL — ABNORMAL HIGH (ref 6–20)
CO2: 26 mmol/L (ref 22–32)
Calcium: 7.8 mg/dL — ABNORMAL LOW (ref 8.9–10.3)
Chloride: 106 mmol/L (ref 101–111)
Creatinine, Ser: 2.33 mg/dL — ABNORMAL HIGH (ref 0.44–1.00)
GFR calc Af Amer: 22 mL/min — ABNORMAL LOW (ref >60)
GFR calc non Af Amer: 19 mL/min — ABNORMAL LOW (ref >60)
Glucose, Bld: 205 mg/dL — ABNORMAL HIGH (ref 65–99)
Potassium: 3.3 mmol/L — ABNORMAL LOW (ref 3.5–5.1)
Sodium: 136 mmol/L (ref 135–145)
Total Bilirubin: 0.6 mg/dL (ref 0.3–1.2)
Total Protein: 5.7 g/dL — ABNORMAL LOW (ref 6.5–8.1)

## 2014-09-27 LAB — MRSA PCR SCREENING: MRSA by PCR: NEGATIVE

## 2014-09-27 LAB — CBC WITH DIFFERENTIAL/PLATELET
Basophils Absolute: 0 10*3/uL (ref 0.0–0.1)
Basophils Relative: 0 % (ref 0–1)
Eosinophils Absolute: 0 10*3/uL (ref 0.0–0.7)
Eosinophils Relative: 0 % (ref 0–5)
HCT: 26.6 % — ABNORMAL LOW (ref 36.0–46.0)
Hemoglobin: 7.6 g/dL — ABNORMAL LOW (ref 12.0–15.0)
Lymphocytes Relative: 2 % — ABNORMAL LOW (ref 12–46)
Lymphs Abs: 0.5 10*3/uL — ABNORMAL LOW (ref 0.7–4.0)
MCH: 22.2 pg — ABNORMAL LOW (ref 26.0–34.0)
MCHC: 28.6 g/dL — ABNORMAL LOW (ref 30.0–36.0)
MCV: 77.8 fL — ABNORMAL LOW (ref 78.0–100.0)
Monocytes Absolute: 1.5 10*3/uL — ABNORMAL HIGH (ref 0.1–1.0)
Monocytes Relative: 8 % (ref 3–12)
Neutro Abs: 17.3 10*3/uL — ABNORMAL HIGH (ref 1.7–7.7)
Neutrophils Relative %: 90 % — ABNORMAL HIGH (ref 43–77)
Platelets: 233 10*3/uL (ref 150–400)
RBC: 3.42 MIL/uL — ABNORMAL LOW (ref 3.87–5.11)
RDW: 17.8 % — ABNORMAL HIGH (ref 11.5–15.5)
WBC: 19.2 10*3/uL — ABNORMAL HIGH (ref 4.0–10.5)

## 2014-09-27 LAB — URINALYSIS, ROUTINE W REFLEX MICROSCOPIC
Glucose, UA: NEGATIVE mg/dL
Ketones, ur: 15 mg/dL — AB
Nitrite: NEGATIVE
Protein, ur: 100 mg/dL — AB
Specific Gravity, Urine: 1.017 (ref 1.005–1.030)
Urobilinogen, UA: 1 mg/dL (ref 0.0–1.0)
pH: 5 (ref 5.0–8.0)

## 2014-09-27 LAB — LACTATE DEHYDROGENASE: LDH: 119 U/L (ref 98–192)

## 2014-09-27 LAB — I-STAT CG4 LACTIC ACID, ED
Lactic Acid, Venous: 1.78 mmol/L (ref 0.5–2.0)
Lactic Acid, Venous: 1.34 mmol/L (ref 0.5–2.0)

## 2014-09-27 LAB — URINE MICROSCOPIC-ADD ON

## 2014-09-27 LAB — I-STAT TROPONIN, ED: Troponin i, poc: 0.01 ng/mL (ref 0.00–0.08)

## 2014-09-27 LAB — PREPARE RBC (CROSSMATCH)

## 2014-09-27 LAB — POC OCCULT BLOOD, ED: Fecal Occult Bld: NEGATIVE

## 2014-09-27 LAB — PROCALCITONIN: Procalcitonin: 14.05 ng/mL

## 2014-09-27 LAB — PHOSPHORUS: Phosphorus: 2.6 mg/dL (ref 2.5–4.6)

## 2014-09-27 LAB — SAVE SMEAR

## 2014-09-27 MED ORDER — LEVOTHYROXINE SODIUM 150 MCG PO TABS
150.0000 ug | ORAL_TABLET | Freq: Every day | ORAL | Status: DC
  Administered 2014-09-28 – 2014-10-02 (×5): 150 ug via ORAL
  Filled 2014-09-27 (×8): qty 1

## 2014-09-27 MED ORDER — SODIUM CHLORIDE 0.9 % IV BOLUS (SEPSIS)
1000.0000 mL | INTRAVENOUS | Status: AC
  Administered 2014-09-27 (×3): 1000 mL via INTRAVENOUS

## 2014-09-27 MED ORDER — VANCOMYCIN HCL IN DEXTROSE 1-5 GM/200ML-% IV SOLN
1000.0000 mg | Freq: Once | INTRAVENOUS | Status: AC
  Administered 2014-09-27: 1000 mg via INTRAVENOUS
  Filled 2014-09-27: qty 200

## 2014-09-27 MED ORDER — FAMOTIDINE 20 MG PO TABS
20.0000 mg | ORAL_TABLET | Freq: Every day | ORAL | Status: DC
Start: 2014-09-27 — End: 2014-10-02
  Administered 2014-09-27 – 2014-10-01 (×5): 20 mg via ORAL
  Filled 2014-09-27 (×7): qty 1

## 2014-09-27 MED ORDER — PIPERACILLIN-TAZOBACTAM 3.375 G IVPB 30 MIN
3.3750 g | Freq: Once | INTRAVENOUS | Status: AC
  Administered 2014-09-27: 3.375 g via INTRAVENOUS
  Filled 2014-09-27: qty 50

## 2014-09-27 MED ORDER — ONDANSETRON HCL 4 MG/2ML IJ SOLN: 4.0000 mg | Freq: Four times a day (QID) | INTRAMUSCULAR | Status: DC | PRN

## 2014-09-27 MED ORDER — VALSARTAN 160 MG PO TABS: 80.0000 mg | ORAL_TABLET | Freq: Every day | ORAL | Status: DC

## 2014-09-27 MED ORDER — ACETAMINOPHEN 325 MG PO TABS
650.0000 mg | ORAL_TABLET | ORAL | Status: DC | PRN
  Administered 2014-09-28 – 2014-09-30 (×4): 650 mg via ORAL
  Filled 2014-09-27 (×4): qty 2

## 2014-09-27 MED ORDER — SODIUM CHLORIDE 0.9 % IV SOLN: 250.0000 mL | INTRAVENOUS | Status: DC | PRN

## 2014-09-27 MED ORDER — PANTOPRAZOLE SODIUM 40 MG PO TBEC
40.0000 mg | DELAYED_RELEASE_TABLET | Freq: Every day | ORAL | Status: DC
  Administered 2014-09-28: 40 mg via ORAL
  Filled 2014-09-27: qty 1

## 2014-09-27 MED ORDER — VANCOMYCIN HCL 10 G IV SOLR
1500.0000 mg | INTRAVENOUS | Status: DC
  Administered 2014-09-29: 1500 mg via INTRAVENOUS
  Filled 2014-09-27: qty 1500

## 2014-09-27 MED ORDER — INSULIN ASPART 100 UNIT/ML ~~LOC~~ SOLN
0.0000 [IU] | Freq: Three times a day (TID) | SUBCUTANEOUS | Status: DC
  Administered 2014-09-28: 2 [IU] via SUBCUTANEOUS
  Administered 2014-09-28: 3 [IU] via SUBCUTANEOUS
  Administered 2014-09-29: 2 [IU] via SUBCUTANEOUS
  Administered 2014-09-29: 5 [IU] via SUBCUTANEOUS
  Administered 2014-09-29: 2 [IU] via SUBCUTANEOUS
  Administered 2014-09-30 (×2): 5 [IU] via SUBCUTANEOUS
  Administered 2014-09-30: 3 [IU] via SUBCUTANEOUS

## 2014-09-27 MED ORDER — ALPRAZOLAM 0.5 MG PO TABS
0.5000 mg | ORAL_TABLET | Freq: Three times a day (TID) | ORAL | Status: DC | PRN
  Administered 2014-09-27 – 2014-09-29 (×3): 0.5 mg via ORAL
  Filled 2014-09-27 (×3): qty 1

## 2014-09-27 MED ORDER — INSULIN ASPART 100 UNIT/ML ~~LOC~~ SOLN: 0.0000 [IU] | Freq: Every day | SUBCUTANEOUS | Status: DC

## 2014-09-27 MED ORDER — SODIUM CHLORIDE 0.9 % IV BOLUS (SEPSIS)
500.0000 mL | INTRAVENOUS | Status: AC
  Administered 2014-09-27: 500 mL via INTRAVENOUS

## 2014-09-27 MED ORDER — BUDESONIDE-FORMOTEROL FUMARATE 160-4.5 MCG/ACT IN AERO
2.0000 | INHALATION_SPRAY | Freq: Two times a day (BID) | RESPIRATORY_TRACT | Status: DC
  Administered 2014-09-27 – 2014-10-02 (×10): 2 via RESPIRATORY_TRACT
  Filled 2014-09-27: qty 6

## 2014-09-27 MED ORDER — ROSUVASTATIN CALCIUM 10 MG PO TABS
10.0000 mg | ORAL_TABLET | Freq: Every day | ORAL | Status: DC
  Administered 2014-09-27 – 2014-10-01 (×5): 10 mg via ORAL
  Filled 2014-09-27 (×7): qty 1

## 2014-09-27 MED ORDER — CEFTRIAXONE SODIUM IN DEXTROSE 20 MG/ML IV SOLN
1.0000 g | INTRAVENOUS | Status: DC
  Administered 2014-09-27 – 2014-09-29 (×3): 1 g via INTRAVENOUS
  Filled 2014-09-27 (×4): qty 50

## 2014-09-27 MED ORDER — SODIUM CHLORIDE 0.9 % IV SOLN
INTRAVENOUS | Status: DC
  Administered 2014-09-28 – 2014-09-29 (×4): via INTRAVENOUS

## 2014-09-27 MED ORDER — PHENYLEPHRINE HCL 10 MG/ML IJ SOLN
30.0000 ug/min | INTRAVENOUS | Status: DC
  Filled 2014-09-27: qty 1

## 2014-09-27 MED ORDER — SODIUM CHLORIDE 0.9 % IV SOLN: Freq: Once | INTRAVENOUS | Status: DC

## 2014-09-27 NOTE — ED Provider Notes (Addendum)
Patient signed out by Dr. Acquanetta Belling for recheck of BP post fluid resusitation and disposition to ICU vs SD. He had already reviewed case with Dr. Ashok Cordia. Dr. Ashok Cordia updated by myself. At this time due to BP remaining 90-80s, patient will be admitted to ICU for sepsis. Her MS and respiratory status are normal. Dr. Ashok Cordia advises for admission under Dr. Lake Bells with whom he will review the case.  Charlesetta Shanks, MD 09/27/14 1719  Charlesetta Shanks, MD 09/27/14 [phone]

## 2014-09-27 NOTE — Telephone Encounter (Signed)
lmtcb for [employer]

## 2014-09-27 NOTE — Assessment & Plan Note (Signed)
To ER, given the low BP.  Unclear source, EMS on site at this point.  Patient agrees with plan.

## 2014-09-27 NOTE — H&P (Signed)
PULMONARY / CRITICAL CARE MEDICINE   Name: Alexandria [name] MRN: [id number] DOB: Apr 20, 1935    ADMISSION DATE:  09/27/2014 CONSULTATION DATE:  09/27/2014  REFERRING MD :  ER physician  CHIEF COMPLAINT:  Felt faint, fatigue  INITIAL PRESENTATION: 79 year old female with a past medical history significant for hypertension and diabetes presented to the emergency department on 09/27/2014 after feeling fatigued and noted to be hypotensive at her primary care physician's office.  STUDIES:    SIGNIFICANT EVENTS:    HISTORY OF PRESENT ILLNESS:  This is a pleasant 79 year old female with a past medical history significant for hypertension, hypothyroidism, and diabetes 2 came to the [facility] emergency department on 09/27/2014 from her primary care physician's office. She been experiencing a fever to 102, body aches, and generalized weakness for 1-2 days. She also noted some burning on urination. She been trying to eat and drink normally and she said her appetite had been well. She did not have diarrhea, nausea, vomiting, or chest pain. She had been coughing for about 1 week prior to admission. Notably, one week ago she was seen in emergency department for similar symptoms (low blood pressure, fatigue) and another state while traveling. She was treated briefly with fluids and was discharged. She said that none of her medications a change recently and she continues to take her losartan. She does not take any diuretics. She denies sick contacts. She denies rash.  PAST MEDICAL HISTORY :   has a past medical history of Depression (12/07/1998); Hypothyroidism (03/08/1968); Hypertension (03/08/1992); Hyperlipidemia (10/04/2000); Diabetes mellitus type II (02/05/2006); COPD (chronic obstructive pulmonary disease) (03/08/1998); Ventral hernia; Esophagitis; Allergy, unspecified not elsewhere classified; Disorders of bursae and tendons in shoulder region, unspecified; Obesity; Peripheral neuropathy; OSA  (obstructive sleep apnea); and GERD (gastroesophageal reflux disease).  has past surgical history that includes Tonsillectomy; Rotator cuff repair (1984); Cesarean section; Total abdominal hysterectomy (1985); Cholecystectomy (1997); THUMB RELEASE (12/1997); Carpal tunnel release (12/1997); Esophagogastroduodenoscopy (12/05/1997); ABD U/S (03/19/1999); ct abd w & pelvis wo/w cm; CARDIOLITE PERSANTINE (08/24/2000); CAROTID U/S (08/24/2000); DEXA (07/03/2003); ADENOSINE MYOVIEW (06/02/2007); CAROTID U/S (06/02/2007); US echocardiography (06/02/2007); Hernia repair (01/24/2009); knee arthroscopic surgery (years ago); Shoulder open rotator cuff repair (02/08/2012); and Gallbladder surgery. Prior to Admission medications   Medication Sig Start Date End Date Taking? Authorizing Provider  albuterol (PROAIR HFA) 108 (90 BASE) MCG/ACT inhaler Inhale 2 puffs into the lungs every 6 (six) hours as needed for wheezing or shortness of breath. 06/13/14  Yes Tonia Ghent, MD  ALPRAZolam Duanne Moron) 0.5 MG tablet Take 0.25-0.5 mg by mouth 3 (three) times daily. 0.'25mg'$  in the morning, 0.'5mg'$  midday, and 0.'25mg'$  in the evening   Yes Historical Provider, MD  aspirin 81 MG EC tablet Take 81 mg by mouth daily.     Yes Historical Provider, MD  budesonide-formoterol (SYMBICORT) 160-4.5 MCG/ACT inhaler INHALE 2 PUFFS FIRST THING IN THE MORNING AND 2 PUFFS AGAIN IN THE EVENING ABOUT 12 HOURS LATER 06/13/14  Yes Tonia Ghent, MD  Coenzyme Q-10 200 MG CAPS Take 1 capsule by mouth daily.     Yes Historical Provider, MD  Cyanocobalamin (B-12 PO) Take 1 tablet by mouth daily.   Yes Historical Provider, MD  EPINEPHrine (EPIPEN) 0.3 mg/0.3 mL DEVI Inject 0.3 mg into the muscle as directed.    Yes Historical Provider, MD  ergocalciferol (VITAMIN D2) 50000 UNITS capsule Take 50,000 Units by mouth every Friday.    Yes Historical Provider, MD  ezetimibe (ZETIA) 10 MG tablet Take 1 tablet (  10 mg total) by mouth at bedtime. 10/21/10  Yes Tammy S  Parrett, NP  famotidine (PEPCID) 20 MG tablet Take 20 mg by mouth at bedtime.    Yes Historical Provider, MD  FLUoxetine (PROZAC) 10 MG capsule TAKE 2 CAPSULES IN THE MORNING AND TAKE 2 CAPSULES IN THE EVENING 04/13/14  Yes Tonia Ghent, MD  fluticasone [facility]) 50 MCG/ACT nasal spray USE 2 SPRAYS INTO THE NOSE EVERY 12 HOURS AS NEEDED FOR STUFFY NOSE 02/12/14  Yes Tonia Ghent, MD  ibuprofen (ADVIL,MOTRIN) 200 MG tablet Take 200-400 mg by mouth every 6 (six) hours as needed for moderate pain.    Yes Historical Provider, MD  levothyroxine (SYNTHROID, LEVOTHROID) 150 MCG tablet Take 150 mcg by mouth daily before breakfast. Can not take generic. Must be synthroid   Yes Historical Provider, MD  Liniments (SALONPAS PAIN RELIEF PATCH EX) Apply 1 patch topically daily as needed (pain).   Yes Historical Provider, MD  NEXIUM 40 MG capsule TAKE 1 CAPSULE DAILY 05/01/12  Yes Tonia Ghent, MD  rosuvastatin (CRESTOR) 10 MG tablet Take 1 tablet (10 mg total) by mouth at bedtime. 06/19/13  Yes Tonia Ghent, MD  sitaGLIPtin (JANUVIA) 100 MG tablet Take 1 tablet (100 mg total) by mouth daily. 10/21/10  Yes Tammy S Parrett, NP  valsartan (DIOVAN) 160 MG tablet Take 0.5 tablets (80 mg total) by mouth daily before breakfast. 09/27/14  Yes Tonia Ghent, MD  ALPRAZolam Duanne Moron) 0.5 MG tablet TAKE ONE-HALF TABLET BY MOUTH TWICE A DAY AS NEEDED Patient not taking: Reported on 09/27/2014 06/03/14   Tonia Ghent, MD   Allergies  Allergen Reactions  . Clarithromycin     REACTION: diff swallowing and mouth blisters  . Codeine Other (See Comments)    Unknown   . Spiriva Handihaler [Tiotropium Bromide Monohydrate] Other (See Comments)    Voice changes  . Sulfonamide Derivatives     REACTION: closed throat    FAMILY HISTORY:  indicated that her mother is deceased. She indicated that her father is deceased. She indicated that her sister is alive. She indicated that all of her three brothers are alive.  SOCIAL  HISTORY:  reports that she quit smoking about 9 years ago. Her smoking use included Cigarettes. She has a 100 pack-year smoking history. She has never used smokeless tobacco. She reports that she drinks about 1.2 oz of alcohol per week. She reports that she does not use illicit drugs.  REVIEW OF SYSTEMS:  Gen: per HPI HEENT: Denies blurred vision, double vision, hearing loss, tinnitus, sinus congestion, rhinorrhea, sore throat, neck stiffness, dysphagia PULM: Denies shortness of breath, cough, sputum production, hemoptysis, wheezing CV: Denies chest pain, edema, orthopnea, paroxysmal nocturnal dyspnea, palpitations GI: Denies abdominal pain, nausea, vomiting, diarrhea, hematochezia, melena, constipation, change in bowel habits GU: Denies dysuria, hematuria, polyuria, oliguria, urethral discharge Endocrine: Denies hot or cold intolerance, polyuria, polyphagia or appetite change Derm: Denies rash, dry skin, scaling or peeling skin change Heme: Denies easy bruising, bleeding, bleeding gums Neuro: Denies headache, numbness, weakness, slurred speech, loss of memory or consciousness   SUBJECTIVE:   VITAL SIGNS: Temp:  [98.3 F (36.8 C)-99.1 F (37.3 C)] 98.4 F (36.9 C) (07/22 1423) Pulse Rate:  [76-99] 76 (07/22 1745) Resp:  [11-22] 22 (07/22 1745) BP: (66-106)/(29-69) 91/31 mmHg (07/22 1745) SpO2:  [90 %-100 %] 99 % (07/22 1745) Weight:  [105.235 kg (232 lb)] 105.235 kg (232 lb) (07/22 1323) HEMODYNAMICS:   VENTILATOR SETTINGS:  INTAKE / OUTPUT:  Intake/Output Summary (Last 24 hours) at 09/27/14 1758 Last data filed at 09/27/14 1702  Gross per 24 hour  Intake   3500 ml  Output      0 ml  Net   3500 ml    PHYSICAL EXAMINATION: General:  Awake and alert, laughing, talking to family Neuro:  Alert and oriented 4, moves all 4 extremities well, HEENT:  NCAT, mucous membranes profoundly dry Cardiovascular:  Regular rate and rhythm no murmurs gallops rubs Lungs:  Clear to  auscultation bilaterally Abdomen:  Bowel sounds positive nontender nondistended to deep palpation Musculoskeletal:  Normal bulk and tone Skin:  No rash or skin breakdown  LABS:  CBC  Recent Labs Lab 09/27/14 1408  WBC 19.2*  HGB 7.6*  HCT 26.6*  PLT 233   Coag's No results for input(s): APTT, INR in the last 168 hours. BMET  Recent Labs Lab 09/27/14 1408  NA 136  K 3.3*  CL 106  CO2 26  BUN 24*  CREATININE 2.33*  GLUCOSE 205*   Electrolytes  Recent Labs Lab 09/27/14 1408  CALCIUM 7.8*   Sepsis Markers  Recent Labs Lab 09/27/14 1409 09/27/14 1643  LATICACIDVEN 1.78 1.34   ABG No results for input(s): PHART, PCO2ART, PO2ART in the last 168 hours. Liver Enzymes  Recent Labs Lab 09/27/14 1408  AST 18  ALT 13*  ALKPHOS 72  BILITOT 0.6  ALBUMIN 2.8*   Cardiac Enzymes No results for input(s): TROPONINI, PROBNP in the last 168 hours. Glucose No results for input(s): GLUCAP in the last 168 hours.  Imaging Dg Chest Port 1 View  09/27/2014   CLINICAL DATA:  COPD, dizziness, fever.  EXAM: PORTABLE CHEST - 1 VIEW  COMPARISON:  Chest x-ray dated 07/31/2014.  FINDINGS: Heart size is upper normal, unchanged. Overall cardiomediastinal silhouette is stable in size and configuration. Lungs are clear. Incidental note made of a prominent epicardial fat pad at the left lung base. No evidence of pneumonia. No pleural effusion. No pneumothorax. No acute osseous abnormality.  IMPRESSION: No evidence of acute cardiopulmonary abnormality. No evidence of pneumonia.   Electronically Signed   By: Franki Cabot M.D.   On: 09/27/2014 13:55     ASSESSMENT / PLAN:  PULMONARY A: COPD not an exacerbation  P:   Monitor O2 saturation Continue home Symbicort  CARDIOVASCULAR A: Hypotension uncertain etiology: She primarily seems hypovolemic and despite the low blood pressure she is feeling well with normal mentation.  Clinically she appears to have a viral prodrome though she  does have a urinary tract infection. Though she has a leukocytosis she does not meet SIRS criteria. Her low hemoglobin may be contributing to some degree but she does not appear to be in frank cardiogenic or hemorrhagic shock. Differential diagnosis includes adrenal insufficiency. P:  Continue IV fluids Close monitoring in the intensive care unit Neo-Synephrine as needed to maintain mean arterial pressure greater than 65 Hold home blood pressure medicines Check cortisol  RENAL A:  Acute kidney injury due to hypovolemia P:   IV fluids Monitor BMET and UOP Replace electrolytes as needed   GASTROINTESTINAL A:  GERD P:   Continue home PPI and H2 blocker Regular diet  HEMATOLOGIC A:  Microcytic anemia without evidence of bleeding, uncertain etiology, differential diagnosis includes hemolysis versus less likely viral prodrome (parvo b19?) Versus hemolysis P:  Transfusion for hemoglobin less than 7 Type and screen now Monitor for bleeding Peripheral smear Haptoglobin LDH Check parvo B19  INFECTIOUS  A:  Urinary tract infection P:   BCx2 July 22> UC July 22>  Thank, Zosyn 1 dose each in the emergency department on July 22 Ceftriaxone started July 22 >  ENDOCRINE A:  Diabetes   Hypothyroidism P:   Sliding scale insulin Continue Synthroid  NEUROLOGIC A:  No acute issues P:      FAMILY  - Updates: updated at bedside  My cc time 45 minutes  Roselie Awkward, MD [place] [facility] Pager: [phone] Cell: [phone] After 3pm or if no response, call [phone]  09/27/2014, 5:58 PM

## 2014-09-27 NOTE — ED Notes (Signed)
MD at bedside. 

## 2014-09-27 NOTE — ED Notes (Signed)
Per EMS: pt from PCP office with hypotension; pt c/o generalized weakness and dizziness; pt sent here for further eval; IV 18g L AC, 20g L hand; pt given 1100cc in route

## 2014-09-27 NOTE — Progress Notes (Signed)
ANTIBIOTIC CONSULT NOTE - INITIAL  Pharmacy Consult for Vancomycin, Zosyn  Indication: Sepsis   Allergies  Allergen Reactions  . Clarithromycin     REACTION: diff swallowing and mouth blisters  . Codeine Other (See Comments)    Unknown   . Spiriva Handihaler [Tiotropium Bromide Monohydrate] Other (See Comments)    Voice changes  . Sulfonamide Derivatives     REACTION: closed throat    Patient Measurements: Height: '5\' 4"'$  (162.6 cm) Weight: 232 lb (105.235 kg) IBW/kg (Calculated) : 54.7  Vital Signs: Temp: 98.3 F (36.8 C) (07/22 1323) Temp Source: Oral (07/22 1323) BP: 89/44 mmHg (07/22 1330) Pulse Rate: 90 (07/22 1330) Intake/Output from previous day:   Intake/Output from this shift:    Labs: No results for input(s): WBC, HGB, PLT, LABCREA, CREATININE in the last 72 hours. CrCl cannot be calculated (Patient has no serum creatinine result on file.). No results for input(s): VANCOTROUGH, VANCOPEAK, VANCORANDOM, GENTTROUGH, GENTPEAK, GENTRANDOM, TOBRATROUGH, TOBRAPEAK, TOBRARND, AMIKACINPEAK, AMIKACINTROU, AMIKACIN in the last 72 hours.   Microbiology: No results found for this or any previous visit (from the past 720 hour(s)).  Medical History: Past Medical History  Diagnosis Date  . Depression 12/07/1998  . Hypothyroidism 03/08/1968  . Hypertension 03/08/1992  . Hyperlipidemia 10/04/2000  . Diabetes mellitus type II 02/05/2006    Dr. Elyse Hsu with endo  . COPD (chronic obstructive pulmonary disease) 03/08/1998    PFTs 12/12/2002 FEV 1 1.42 (64%) ratio 58 with no better after B2 and DLCO75%; PFTs 11/20/09 FEV1 1.50 (73%) ratio 50 no better after B2 with DLCO 62%; Hfa 50% 11/20/2009 >75%, 01/13/10 p coaching  . Ventral hernia   . Esophagitis   . Allergy, unspecified not elsewhere classified   . Disorders of bursae and tendons in shoulder region, unspecified     Rotator cuff syndrome, right  . Obesity     NOS  . Peripheral neuropathy     Likely due to DM per Dr.  Erling Cruz  . OSA (obstructive sleep apnea)     PSG 01/27/10 AHI 13, pt does not know CPAP settings  . GERD (gastroesophageal reflux disease)     Medications:   (Not in a hospital admission) Assessment: 16 YOF who presented to the ED with hypotension and c/o generalized weakness and dizziness. Pharmacy consulted to start Vancomycin and Zosyn for empiric sepsis coverage. WBC is elevated at 19.2. Pt is afebrile. SCr elevated at 2.33 during this admission. BL SCr ~ 1.07  Cultures: 7/22 BCx2>> 7/22 UCx>>   Goal of Therapy:  Vancomycin trough level 15-20 mcg/ml  Plan:  -Zosyn 3.375 gm IV Q 8 hours -Give additional Vancomycin 1 gm load for total of 2 gm followed by 1500 mg IV Q 48 hours -Monitor CBC, renal fx, cultures and clinical progress -VT at [place], PharmD., BCPS Clinical Pharmacist Pager [phone]

## 2014-09-27 NOTE — ED Notes (Signed)
Spoke with blood bank. Blood banks states pts blood was postive for antibodies and it would take longer to prepare blood.

## 2014-09-27 NOTE — ED Provider Notes (Signed)
CSN: [id number]     Arrival date & time 09/27/14  1314 History   First MD Initiated Contact with Patient 09/27/14 1322     Chief Complaint  Patient presents with  . Hypotension  . Weakness   HPI Patient presents to the emergency room for evaluation of hypotension. Patient states over the last day or so she's had trouble with weakness, fevers and chills. She had a temperature yesterday up to 102. She felt she was weak and lightheaded whenever she would try to get up and walk around. She is not having any trouble with any nausea or vomiting or diarrhea. She denies chest pain. She has chronic symptoms with COPD and sleep apnea but has not noticed any particular changes associated with those issues. She has not noticed any rashes. She has had some back pain and wonders if she could be developing a urinary tract infection. She went to her doctor's office for evaluation and in the office they noted she was hypotensive. EMS was contacted. She was given 1100 mL of saline.  She is feeling slightly better. Past Medical History  Diagnosis Date  . Depression 12/07/1998  . Hypothyroidism 03/08/1968  . Hypertension 03/08/1992  . Hyperlipidemia 10/04/2000  . Diabetes mellitus type II 02/05/2006    Dr. Elyse Hsu with endo  . COPD (chronic obstructive pulmonary disease) 03/08/1998    PFTs 12/12/2002 FEV 1 1.42 (64%) ratio 58 with no better after B2 and DLCO75%; PFTs 11/20/09 FEV1 1.50 (73%) ratio 50 no better after B2 with DLCO 62%; Hfa 50% 11/20/2009 >75%, 01/13/10 p coaching  . Ventral hernia   . Esophagitis   . Allergy, unspecified not elsewhere classified   . Disorders of bursae and tendons in shoulder region, unspecified     Rotator cuff syndrome, right  . Obesity     NOS  . Peripheral neuropathy     Likely due to DM per Dr. Erling Cruz  . OSA (obstructive sleep apnea)     PSG 01/27/10 AHI 13, pt does not know CPAP settings  . GERD (gastroesophageal reflux disease)    Past Surgical History  Procedure  Laterality Date  . Tonsillectomy    . Rotator cuff repair  1984    Right, [place]  . Cesarean section      x2 Breech/ repeat  . Total abdominal hysterectomy  1985    Due to dysmennorhea  . Cholecystectomy  1997  . Thumb release  12/1997    Right  . Carpal tunnel release  12/1997    Right  . Esophagogastroduodenoscopy  12/05/1997    Nml (due to hoarseness)  . Abd u/s  03/19/1999    Nml x2 foci in liver  . Ct abd w & pelvis wo/w cm      Abd hemangiomas of liver, 1 cm R renal cyst  . Cardiolite persantine  08/24/2000    Nml  . Carotid u/s  08/24/2000    1-39% ICA stenosis  . Dexa  07/03/2003    Nml  . Adenosine myoview  06/02/2007    Nml  . Carotid u/s  06/02/2007    No apprec change   . US echocardiography  06/02/2007  . Hernia repair  01/24/2009    Lap Ventr w/ Lysis of adhesions (Dr. Donne Hazel)  . Knee arthroscopic surgery  years ago    right  . Shoulder open rotator cuff repair  02/08/2012    Procedure: ROTATOR CUFF REPAIR SHOULDER OPEN;  Surgeon: Magnus Sinning, MD;  Location: [facility];  Service: Orthopedics;  Laterality: Left;  Left Shoulder Open Anterior Acrominectomy Rotator Cuff Repair Open Distal Clavicle Resection ,tissue mend graft, and repair of biceps tendon  . Gallbladder surgery     Family History  Problem Relation Age of Onset  . Heart disease Mother   . Thyroid disease Mother   . Emphysema Father     One lung  . Breast cancer Sister   . Cystic fibrosis Sister   . Hyperthyroidism Sister   . Esophageal cancer Neg Hx   . Cancer Neg Hx     Head or neck  . Osteoarthritis Brother   . Hyperthyroidism Brother    History  Substance Use Topics  . Smoking status: Former Smoker -- 2.00 packs/day for 50 years    Types: Cigarettes    Quit date: 02/05/2005  . Smokeless tobacco: Never Used  . Alcohol Use: 1.2 oz/week    2 Standard drinks or equivalent per week     Comment: occasional   OB History    No data available     Review of Systems  All  other systems reviewed and are negative.     Allergies  Clarithromycin; Codeine; Spiriva handihaler; and Sulfonamide derivatives  Home Medications   Prior to Admission medications   Medication Sig Start Date End Date Taking? Authorizing Provider  albuterol (PROAIR HFA) 108 (90 BASE) MCG/ACT inhaler Inhale 2 puffs into the lungs every 6 (six) hours as needed for wheezing or shortness of breath. 06/13/14  Yes Tonia Ghent, MD  ALPRAZolam Duanne Moron) 0.5 MG tablet Take 0.25-0.5 mg by mouth 3 (three) times daily. 0.'25mg'$  in the morning, 0.'5mg'$  midday, and 0.'25mg'$  in the evening   Yes Historical Provider, MD  aspirin 81 MG EC tablet Take 81 mg by mouth daily.     Yes Historical Provider, MD  budesonide-formoterol (SYMBICORT) 160-4.5 MCG/ACT inhaler INHALE 2 PUFFS FIRST THING IN THE MORNING AND 2 PUFFS AGAIN IN THE EVENING ABOUT 12 HOURS LATER 06/13/14  Yes Tonia Ghent, MD  Coenzyme Q-10 200 MG CAPS Take 1 capsule by mouth daily.     Yes Historical Provider, MD  Cyanocobalamin (B-12 PO) Take 1 tablet by mouth daily.   Yes Historical Provider, MD  EPINEPHrine (EPIPEN) 0.3 mg/0.3 mL DEVI Inject 0.3 mg into the muscle as directed.    Yes Historical Provider, MD  ergocalciferol (VITAMIN D2) 50000 UNITS capsule Take 50,000 Units by mouth every Friday.    Yes Historical Provider, MD  ezetimibe (ZETIA) 10 MG tablet Take 1 tablet (10 mg total) by mouth at bedtime. 10/21/10  Yes Tammy S Parrett, NP  famotidine (PEPCID) 20 MG tablet Take 20 mg by mouth at bedtime.    Yes Historical Provider, MD  FLUoxetine (PROZAC) 10 MG capsule TAKE 2 CAPSULES IN THE MORNING AND TAKE 2 CAPSULES IN THE EVENING 04/13/14  Yes Tonia Ghent, MD  fluticasone [facility]) 50 MCG/ACT nasal spray USE 2 SPRAYS INTO THE NOSE EVERY 12 HOURS AS NEEDED FOR STUFFY NOSE 02/12/14  Yes Tonia Ghent, MD  ibuprofen (ADVIL,MOTRIN) 200 MG tablet Take 200-400 mg by mouth every 6 (six) hours as needed for moderate pain.    Yes Historical Provider, MD   levothyroxine (SYNTHROID, LEVOTHROID) 150 MCG tablet Take 150 mcg by mouth daily before breakfast. Can not take generic. Must be synthroid   Yes Historical Provider, MD  Liniments (SALONPAS PAIN RELIEF PATCH EX) Apply 1 patch topically daily as needed (pain).   Yes Historical Provider, MD  Jonna Munro  40 MG capsule TAKE 1 CAPSULE DAILY 05/01/12  Yes Tonia Ghent, MD  rosuvastatin (CRESTOR) 10 MG tablet Take 1 tablet (10 mg total) by mouth at bedtime. 06/19/13  Yes Tonia Ghent, MD  sitaGLIPtin (JANUVIA) 100 MG tablet Take 1 tablet (100 mg total) by mouth daily. 10/21/10  Yes Tammy S Parrett, NP  valsartan (DIOVAN) 160 MG tablet Take 0.5 tablets (80 mg total) by mouth daily before breakfast. 09/27/14  Yes Tonia Ghent, MD  ALPRAZolam Duanne Moron) 0.5 MG tablet TAKE ONE-HALF TABLET BY MOUTH TWICE A DAY AS NEEDED Patient not taking: Reported on 09/27/2014 06/03/14   Tonia Ghent, MD   BP 90/34 mmHg  Pulse 83  Temp(Src) 98.4 F (36.9 C) (Oral)  Resp 19  Ht '5\' 4"'$  (1.626 m)  Wt 232 lb (105.235 kg)  BMI 39.80 kg/m2  SpO2 98% Physical Exam  Constitutional: No distress.  Obese  HENT:  Head: Normocephalic and atraumatic.  Right Ear: External ear normal.  Left Ear: External ear normal.  Eyes: Conjunctivae are normal. Right eye exhibits no discharge. Left eye exhibits no discharge. No scleral icterus.  Neck: Neck supple. No tracheal deviation present.  Cardiovascular: Normal rate, regular rhythm and intact distal pulses.   Pulmonary/Chest: Effort normal. No stridor. No respiratory distress. She has wheezes. She has no rales.  Abdominal: Soft. Bowel sounds are normal. She exhibits no distension. There is no tenderness. There is no rebound and no guarding.  Musculoskeletal: She exhibits no edema or tenderness.  No rashes, no tenderness  Neurological: She is alert. She has normal strength. No cranial nerve deficit (no facial droop, extraocular movements intact, no slurred speech) or sensory deficit.  She exhibits normal muscle tone. She displays no seizure activity. Coordination normal.  Skin: Skin is warm and dry. No rash noted.  Psychiatric: She has a normal mood and affect.  Nursing note and vitals reviewed.   ED Course  Procedures (including critical care time)  CRITICAL CARE Performed by: OVFIE,PPI Total critical care time: 35 Critical care time was exclusive of separately billable procedures and treating other patients. Critical care was necessary to treat or prevent imminent or life-threatening deterioration. Critical care was time spent personally by me on the following activities: development of treatment plan with patient and/or surrogate as well as nursing, discussions with consultants, evaluation of patient's response to treatment, examination of patient, obtaining history from patient or surrogate, ordering and performing treatments and interventions, ordering and review of laboratory studies, ordering and review of radiographic studies, pulse oximetry and re-evaluation of patient's condition.   Labs Review Labs Reviewed  COMPREHENSIVE METABOLIC PANEL - Abnormal; Notable for the following:    Potassium 3.3 (*)    Glucose, Bld 205 (*)    BUN 24 (*)    Creatinine, Ser 2.33 (*)    Calcium 7.8 (*)    Total Protein 5.7 (*)    Albumin 2.8 (*)    ALT 13 (*)    GFR calc non Af Amer 19 (*)    GFR calc Af Amer 22 (*)    Anion gap 4 (*)    All other components within normal limits  CBC WITH DIFFERENTIAL/PLATELET - Abnormal; Notable for the following:    WBC 19.2 (*)    RBC 3.42 (*)    Hemoglobin 7.6 (*)    HCT 26.6 (*)    MCV 77.8 (*)    MCH 22.2 (*)    MCHC 28.6 (*)    RDW 17.8 (*)  Neutrophils Relative % 90 (*)    Neutro Abs 17.3 (*)    Lymphocytes Relative 2 (*)    Lymphs Abs 0.5 (*)    Monocytes Absolute 1.5 (*)    All other components within normal limits  URINALYSIS, ROUTINE W REFLEX MICROSCOPIC (NOT AT [facility]) - Abnormal; Notable for the following:     Color, Urine AMBER (*)    APPearance CLOUDY (*)    Hgb urine dipstick SMALL (*)    Bilirubin Urine SMALL (*)    Ketones, ur 15 (*)    Protein, ur 100 (*)    Leukocytes, UA LARGE (*)    All other components within normal limits  URINE MICROSCOPIC-ADD ON - Abnormal; Notable for the following:    Squamous Epithelial / LPF MANY (*)    Bacteria, UA MANY (*)    Casts HYALINE CASTS (*)    All other components within normal limits  CULTURE, BLOOD (ROUTINE X 2)  CULTURE, BLOOD (ROUTINE X 2)  URINE CULTURE  I-STAT CG4 LACTIC ACID, ED  I-STAT TROPOININ, ED  POC OCCULT BLOOD, ED  PREPARE RBC (CROSSMATCH)    Imaging Review Dg Chest Port 1 View  09/27/2014   CLINICAL DATA:  COPD, dizziness, fever.  EXAM: PORTABLE CHEST - 1 VIEW  COMPARISON:  Chest x-ray dated 07/31/2014.  FINDINGS: Heart size is upper normal, unchanged. Overall cardiomediastinal silhouette is stable in size and configuration. Lungs are clear. Incidental note made of a prominent epicardial fat pad at the left lung base. No evidence of pneumonia. No pleural effusion. No pneumothorax. No acute osseous abnormality.  IMPRESSION: No evidence of acute cardiopulmonary abnormality. No evidence of pneumonia.   Electronically Signed   By: Franki Cabot M.D.   On: 09/27/2014 13:55     EKG Interpretation   Date/Time:  Friday September 27 2014 13:26:02 EDT Ventricular Rate:  93 PR Interval:  165 QRS Duration: 96 QT Interval:  388 QTC Calculation: 483 R Axis:   65 Text Interpretation:  Sinus rhythm Borderline low voltage, extremity leads  Anteroseptal infarct, old No significant change since last tracing  Confirmed by Rochanda Harpham  MD-J, Lynasia Meloche ([id number]) on 09/27/2014 1:39:09 PM     Medications  sodium chloride 0.9 % bolus 1,000 mL (1,000 mLs Intravenous New Bag/Given 09/27/14 1546)    Followed by  sodium chloride 0.9 % bolus 500 mL (0 mLs Intravenous Stopped 09/27/14 1547)  vancomycin (VANCOCIN) IVPB 1000 mg/200 mL premix (1,000 mg Intravenous New  Bag/Given 09/27/14 1545)  vancomycin (VANCOCIN) 1,500 mg in sodium chloride 0.9 % 500 mL IVPB (not administered)  0.9 %  sodium chloride infusion (not administered)  piperacillin-tazobactam (ZOSYN) IVPB 3.375 g (0 g Intravenous Stopped 09/27/14 1432)  vancomycin (VANCOCIN) IVPB 1000 mg/200 mL premix (0 mg Intravenous Stopped 09/27/14 1547)    MDM   Final diagnoses:  Sepsis, due to unspecified organism   The patient presented to the emergency room with fever and hypotension. He is afebrile here but she didn't measure a temperature of 102 at home. Patient has had some back discomfort she thought she was developing a urinary tract infection. There is some contamination of the urine with the feeling ill cells but I suspect that a urinary tract infection is the source. Pt does have signs of sepsis with elevated white blood cell count and hypotension. The patient was started on antibiotic's epmpirically initially.  Will need to change towards urosepsis regimen.  Pt does have anemia and renal insufficiency that is worse than previous labs.  Will transfuse blood.  Guaic stools.  Discussed case with critical care.  Would like to have her complete her fluid bolus then decide if she needs ICU vs stepdown.    Dorie Rank, MD 09/27/14 [phone]

## 2014-09-27 NOTE — Patient Instructions (Signed)
To ER

## 2014-09-27 NOTE — Telephone Encounter (Signed)
Called EXT and LMTCB for [employer]

## 2014-09-27 NOTE — Telephone Encounter (Signed)
Return call from [place] again.Hillery Hunter'

## 2014-09-27 NOTE — Progress Notes (Signed)
Pre visit review using our clinic review tool, if applicable. No additional management support is needed unless otherwise documented below in the visit note.  Sx started yesterday.  Fever, fatigue, lightheaded, diffuse aches.  Felt some better last night, then sx returned today.  In office- low BP noted, pulse ox 90%, started on O2 and EMS promptly called, in route currently.    PMH and SH reviewed  ROS: See HPI, otherwise noncontributory.  Meds, vitals, and allergies reviewed.   Supine, alert, responsive.  Neck supple Borderline tachy, HR sounds to be regular at ~100 bmp Coarse BS B abd soft

## 2014-09-28 ENCOUNTER — Inpatient Hospital Stay (HOSPITAL_COMMUNITY): Payer: BLUE CROSS/BLUE SHIELD

## 2014-09-28 DIAGNOSIS — E038 Other specified hypothyroidism: Secondary | ICD-10-CM

## 2014-09-28 DIAGNOSIS — I5032 Chronic diastolic (congestive) heart failure: Secondary | ICD-10-CM

## 2014-09-28 DIAGNOSIS — A419 Sepsis, unspecified organism: Principal | ICD-10-CM

## 2014-09-28 DIAGNOSIS — G4733 Obstructive sleep apnea (adult) (pediatric): Secondary | ICD-10-CM

## 2014-09-28 DIAGNOSIS — D649 Anemia, unspecified: Secondary | ICD-10-CM

## 2014-09-28 DIAGNOSIS — I9589 Other hypotension: Secondary | ICD-10-CM

## 2014-09-28 DIAGNOSIS — E662 Morbid (severe) obesity with alveolar hypoventilation: Secondary | ICD-10-CM

## 2014-09-28 DIAGNOSIS — N3000 Acute cystitis without hematuria: Secondary | ICD-10-CM

## 2014-09-28 DIAGNOSIS — R001 Bradycardia, unspecified: Secondary | ICD-10-CM

## 2014-09-28 DIAGNOSIS — D509 Iron deficiency anemia, unspecified: Secondary | ICD-10-CM | POA: Diagnosis present

## 2014-09-28 DIAGNOSIS — J438 Other emphysema: Secondary | ICD-10-CM

## 2014-09-28 DIAGNOSIS — N179 Acute kidney failure, unspecified: Secondary | ICD-10-CM

## 2014-09-28 DIAGNOSIS — E118 Type 2 diabetes mellitus with unspecified complications: Secondary | ICD-10-CM

## 2014-09-28 LAB — COMPREHENSIVE METABOLIC PANEL
ALT: 13 U/L — ABNORMAL LOW (ref 14–54)
AST: 20 U/L (ref 15–41)
Albumin: 2.3 g/dL — ABNORMAL LOW (ref 3.5–5.0)
Alkaline Phosphatase: 67 U/L (ref 38–126)
Anion gap: 5 (ref 5–15)
BUN: 19 mg/dL (ref 6–20)
CO2: 23 mmol/L (ref 22–32)
Calcium: 7.3 mg/dL — ABNORMAL LOW (ref 8.9–10.3)
Chloride: 111 mmol/L (ref 101–111)
Creatinine, Ser: 1.59 mg/dL — ABNORMAL HIGH (ref 0.44–1.00)
GFR calc Af Amer: 35 mL/min — ABNORMAL LOW (ref >60)
GFR calc non Af Amer: 30 mL/min — ABNORMAL LOW (ref >60)
Glucose, Bld: 128 mg/dL — ABNORMAL HIGH (ref 65–99)
Potassium: 4 mmol/L (ref 3.5–5.1)
Sodium: 139 mmol/L (ref 135–145)
Total Bilirubin: 0.4 mg/dL (ref 0.3–1.2)
Total Protein: 5.5 g/dL — ABNORMAL LOW (ref 6.5–8.1)

## 2014-09-28 LAB — VITAMIN B12: Vitamin B-12: 2022 pg/mL — ABNORMAL HIGH (ref 180–914)

## 2014-09-28 LAB — RETICULOCYTES
RBC.: 3.3 MIL/uL — ABNORMAL LOW (ref 3.87–5.11)
Retic Count, Absolute: 49.5 10*3/uL (ref 19.0–186.0)
Retic Ct Pct: 1.5 % (ref 0.4–3.1)

## 2014-09-28 LAB — CBC WITH DIFFERENTIAL/PLATELET
Basophils Absolute: 0 10*3/uL (ref 0.0–0.1)
Basophils Relative: 0 % (ref 0–1)
Eosinophils Absolute: 0 10*3/uL (ref 0.0–0.7)
Eosinophils Relative: 0 % (ref 0–5)
HCT: 25.3 % — ABNORMAL LOW (ref 36.0–46.0)
Hemoglobin: 7 g/dL — ABNORMAL LOW (ref 12.0–15.0)
Lymphocytes Relative: 7 % — ABNORMAL LOW (ref 12–46)
Lymphs Abs: 0.9 10*3/uL (ref 0.7–4.0)
MCH: 21.5 pg — ABNORMAL LOW (ref 26.0–34.0)
MCHC: 27.7 g/dL — ABNORMAL LOW (ref 30.0–36.0)
MCV: 77.8 fL — ABNORMAL LOW (ref 78.0–100.0)
Monocytes Absolute: 1.3 10*3/uL — ABNORMAL HIGH (ref 0.1–1.0)
Monocytes Relative: 10 % (ref 3–12)
Neutro Abs: 10.2 10*3/uL — ABNORMAL HIGH (ref 1.7–7.7)
Neutrophils Relative %: 83 % — ABNORMAL HIGH (ref 43–77)
Platelets: 192 10*3/uL (ref 150–400)
RBC: 3.25 MIL/uL — ABNORMAL LOW (ref 3.87–5.11)
RDW: 18 % — ABNORMAL HIGH (ref 11.5–15.5)
WBC: 12.4 10*3/uL — ABNORMAL HIGH (ref 4.0–10.5)

## 2014-09-28 LAB — URINE CULTURE: Culture: 5000

## 2014-09-28 LAB — IRON AND TIBC
Iron: 7 ug/dL — ABNORMAL LOW (ref 28–170)
Saturation Ratios: 2 % — ABNORMAL LOW (ref 10.4–31.8)
TIBC: 307 ug/dL (ref 250–450)
UIBC: 300 ug/dL

## 2014-09-28 LAB — PROCALCITONIN: Procalcitonin: 11.22 ng/mL

## 2014-09-28 LAB — HAPTOGLOBIN: Haptoglobin: 189 mg/dL (ref 34–200)

## 2014-09-28 LAB — GLUCOSE, CAPILLARY
Glucose-Capillary: 153 mg/dL — ABNORMAL HIGH (ref 65–99)
Glucose-Capillary: 149 mg/dL — ABNORMAL HIGH (ref 65–99)
Glucose-Capillary: 120 mg/dL — ABNORMAL HIGH (ref 65–99)
Glucose-Capillary: 178 mg/dL — ABNORMAL HIGH (ref 65–99)

## 2014-09-28 LAB — LACTATE DEHYDROGENASE: LDH: 149 U/L (ref 98–192)

## 2014-09-28 LAB — FOLATE: Folate: 8.4 ng/mL (ref >5.9)

## 2014-09-28 LAB — FERRITIN: Ferritin: 17 ng/mL (ref 11–307)

## 2014-09-28 LAB — MAGNESIUM: Magnesium: 1.9 mg/dL (ref 1.7–2.4)

## 2014-09-28 MED ORDER — PANTOPRAZOLE SODIUM 40 MG PO TBEC: 40.0000 mg | DELAYED_RELEASE_TABLET | Freq: Two times a day (BID) | ORAL | Status: DC

## 2014-09-28 MED ORDER — FLUOXETINE HCL 20 MG PO CAPS
20.0000 mg | ORAL_CAPSULE | Freq: Two times a day (BID) | ORAL | Status: DC
  Administered 2014-09-28 – 2014-09-29 (×4): 20 mg via ORAL
  Filled 2014-09-28 (×4): qty 1

## 2014-09-28 MED ORDER — IRBESARTAN 75 MG PO TABS
75.0000 mg | ORAL_TABLET | Freq: Every day | ORAL | Status: DC
  Administered 2014-09-29 – 2014-10-02 (×4): 75 mg via ORAL
  Filled 2014-09-28 (×5): qty 1

## 2014-09-28 MED ORDER — SODIUM CHLORIDE 0.9 % IV SOLN: Freq: Once | INTRAVENOUS | Status: DC

## 2014-09-28 MED ORDER — PANTOPRAZOLE SODIUM 40 MG IV SOLR
40.0000 mg | Freq: Two times a day (BID) | INTRAVENOUS | Status: DC
  Administered 2014-09-28 – 2014-09-29 (×3): 40 mg via INTRAVENOUS
  Filled 2014-09-28 (×6): qty 40

## 2014-09-28 NOTE — Progress Notes (Signed)
Notified that blood is ready

## 2014-09-28 NOTE — Progress Notes (Signed)
  Echocardiogram 2D Echocardiogram has been performed.  Alexandria [name] 09/28/2014, 9:41 AM

## 2014-09-28 NOTE — Progress Notes (Signed)
Notified by blood bank that blood would be ready in 1-2 hrs due to antibodies.

## 2014-09-28 NOTE — Progress Notes (Signed)
Called by blood bank to notify that there was going to be a delay in patient blood readiness due to antigen in patient's blood.

## 2014-09-28 NOTE — Progress Notes (Signed)
Utilization Review completed. Jhony Antrim RN BSN CM 

## 2014-09-28 NOTE — Progress Notes (Signed)
[place] TEAM 1 - Stepdown/ICU TEAM Progress Note  Alexandria [name] PYK:[id number] DOB: 21-Aug-1935 DOA: 09/27/2014 PCP: Elsie Stain, MD  Admit HPI / Brief Narrative: 79 year old WF PMHx Depression, HTN, HLD, COPD, OSA, GERD, Diabetes Type 2, Peripheral Neuropathy,   Came to the [facility] emergency department on 09/27/2014 from her primary care physician's office. She been experiencing a fever to 102, body aches, and generalized weakness for 1-2 days. She also noted some burning on urination. She been trying to eat and drink normally and she said her appetite had been well. She did not have diarrhea, nausea, vomiting, or chest pain. She had been coughing for about 1 week prior to admission. Notably, one week ago she was seen in emergency department for similar symptoms (low blood pressure, fatigue) in another [place] while [place]. She was treated briefly with fluids and was discharged. She said that none of her medications a change recently and she continues to take her losartan. She does not take any diuretics. She denies sick contacts. She denies rash.  HPI/Subjective: 7/23 A/O 4, NAD,. States increasing DOE over several months. States just evaluated by Dr. Halford Chessman [facility] M) with pulmonary function test; results unknown. States negative bright red blood per rectum/melanoma. States reason for hospitalization was increasing fatigue with presyncopal events (Dizzy/lightheaded). States feels as if she needs to be on home O2 but has not been placed on by PCP or [facility] yet.  Assessment/Plan: COPD   -Obtain Ambulatory SPO2  -Monitor O2 saturation Continue home Symbicort  OSA/Obesity Hypoventilation Syndrome -CPAP per respiratory  Diastolic CHF -Echo 53/11/7671; LVEF= 41-93%, grade 1 diastolic dysfunction -Repeat echocardiogram -Transfuse for hemoglobin<8 -7/23 Transfuse 2 units PRBC  Hypotension uncertain etiology:  -Most likely multifactorial to include anemia, OHS, OSA, UTI   -Resolved  Acute renal failure due to hypovolemia (baseline Cr~1.07) -Improving,Cr  currently 1.59 -Continue normal saline at 50 ml/hr   Urinary tract infection -Urine culture pending -Continue ceftriaxone for complete 5 day course  Continue home PPI and H2 blocker -Regular diet -Protonix 40 mg BID -Famotidine 20 mg QHS  Anemia Hemolysis vs less likely viral prodrome (parvo b19?)  vs hemolysis -7/23 Transfuse 2 units PRBC -Transfusion for hemoglobin< 8 -Anemia panel -Haptoglobin -LDH -Parvo B19 pending  DiabetesType 2  -A1c pending -Lipid panel pending -Continue moderate SSI  Hypothyroidism -Continue Synthroid 150 g daily -TSH pending   Code Status: FULL Family Communication:  family present at time of exam Disposition Plan: Next 24-48 hours     Consultants: Dr.Clinton Lucia Estelle [facility])   Procedure/Significant Events:    Culture BCx2 July 22> UC July 22>   Antibiotics:  Zosyn 1 dose in ED Ceftriaxone 7/22>>   DVT prophylaxis: SCD   Devices    LINES / TUBES:      Continuous Infusions: . sodium chloride 50 mL/hr at 09/28/14 2222    Objective: VITAL SIGNS: Temp: 97.8 F (36.6 C) (07/24 0400) Temp Source: Oral (07/24 0400) BP: 166/69 mmHg (07/24 0600) Pulse Rate: 75 (07/24 0600) SPO2; FIO2:   Intake/Output Summary (Last 24 hours) at 09/29/14 0730 Last data filed at 09/29/14 0700  Gross per 24 hour  Intake 1716.67 ml  Output    250 ml  Net 1466.67 ml     Exam: General:A/O 4, NAD,. Patient with apparent increased WBC (purses lips when breathing), Acute respiratory distress Eyes: Negative headache, eye pain, double vision, negative scleral hemorrhage ENT: Negative Runny nose, negative ear pain, negative tinnitus, negative gingival bleeding Neck:  Negative scars, masses, torticollis, lymphadenopathy, JVD Lungs: Clear to auscultation bilaterally without wheezes or crackles Cardiovascular: Regular rate and rhythm without  murmur gallop or rub normal S1 and S2 Abdomen:negative abdominal pain, negative dysphagia, Nontender, nondistended, soft, bowel sounds positive, no rebound, no ascites, no appreciable mass Extremities: No significant cyanosis, clubbing, or edema bilateral lower extremities Psychiatric:  Negative depression, negative anxiety, negative fatigue, negative mania  Neurologic:  Cranial nerves II through XII intact, tongue/uvula midline, all extremities muscle strength 5/5, sensation intact throughout, negative dysarthria, negative expressive aphasia, negative receptive aphasia.      Data Reviewed: Basic Metabolic Panel:  Recent Labs Lab 09/27/14 1408 09/27/14 1946 09/28/14 0930 09/29/14 0331  NA 136  --  139 138  K 3.3*  --  4.0 4.4  CL 106  --  111 109  CO2 26  --  23 21*  GLUCOSE 205*  --  128* 140*  BUN 24*  --  19 15  CREATININE 2.33*  --  1.59* 1.27*  CALCIUM 7.8*  --  7.3* 7.7*  MG  --  1.9 1.9  --   PHOS  --  2.6  --   --    Liver Function Tests:  Recent Labs Lab 09/27/14 1408 09/28/14 0930  AST 18 20  ALT 13* 13*  ALKPHOS 72 67  BILITOT 0.6 0.4  PROT 5.7* 5.5*  ALBUMIN 2.8* 2.3*   No results for input(s): LIPASE, AMYLASE in the last 168 hours. No results for input(s): AMMONIA in the last 168 hours. CBC:  Recent Labs Lab 09/27/14 1408 09/28/14 0930 09/29/14 0331  WBC 19.2* 12.4* 11.6*  NEUTROABS 17.3* 10.2* 8.8*  HGB 7.6* 7.0* 9.0*  HCT 26.6* 25.3* 30.5*  MCV 77.8* 77.8* 78.4  PLT 233 192 212   Cardiac Enzymes: No results for input(s): CKTOTAL, CKMB, CKMBINDEX, TROPONINI in the last 168 hours. BNP (last 3 results) No results for input(s): BNP in the last 8760 hours.  ProBNP (last 3 results) No results for input(s): PROBNP in the last 8760 hours.  CBG:  Recent Labs Lab 09/27/14 1823 09/28/14 0827 09/28/14 1300 09/28/14 1657 09/28/14 2139  GLUCAP 146* 153* 149* 120* 178*    Recent Results (from the past 240 hour(s))  Blood Culture (routine x  2)     Status: None (Preliminary result)   Collection Time: 09/27/14  1:57 PM  Result Value Ref Range Status   Specimen Description BLOOD LEFT HAND  Final   Special Requests BOTTLES DRAWN AEROBIC AND ANAEROBIC 3CC  Final   Culture NO GROWTH < 24 HOURS  Final   Report Status PENDING  Incomplete  Blood Culture (routine x 2)     Status: None (Preliminary result)   Collection Time: 09/27/14  2:00 PM  Result Value Ref Range Status   Specimen Description BLOOD RIGHT ANTECUBITAL  Final   Special Requests BOTTLES DRAWN AEROBIC AND ANAEROBIC 10ML  Final   Culture  Setup Time   Final    GRAM NEGATIVE RODS AEROBIC BOTTLE ONLY CRITICAL RESULT CALLED TO, READ BACK BY AND VERIFIED WITH: Melody Comas RN [id number] 09/29/14 A BROWNING    Culture NO GROWTH < 24 HOURS  Final   Report Status PENDING  Incomplete  Urine culture     Status: None   Collection Time: 09/27/14  2:26 PM  Result Value Ref Range Status   Specimen Description URINE, CLEAN CATCH  Final   Special Requests NONE  Final   Culture 5,000 COLONIES/mL INSIGNIFICANT GROWTH  Final  Report Status 09/28/2014 FINAL  Final  MRSA PCR Screening     Status: None   Collection Time: 09/27/14  6:27 PM  Result Value Ref Range Status   MRSA by PCR NEGATIVE NEGATIVE Final    Comment:        The GeneXpert MRSA Assay (FDA approved for NASAL specimens only), is one component of a comprehensive MRSA colonization surveillance program. It is not intended to diagnose MRSA infection nor to guide or monitor treatment for MRSA infections.      Studies:  Recent x-ray studies have been reviewed in detail by the Attending Physician  Scheduled Meds:  Scheduled Meds: . sodium chloride   Intravenous Once  . sodium chloride   Intravenous Once  . budesonide-formoterol  2 puff Inhalation BID  . cefTRIAXone (ROCEPHIN)  IV  1 g Intravenous Q24H  . famotidine  20 mg Oral QHS  . FLUoxetine  20 mg Oral BID  . insulin aspart  0-15 Units Subcutaneous TID WC  .  insulin aspart  0-5 Units Subcutaneous QHS  . irbesartan  75 mg Oral Daily  . levothyroxine  150 mcg Oral QAC breakfast  . pantoprazole (PROTONIX) IV  40 mg Intravenous Q12H  . rosuvastatin  10 mg Oral QHS  . vancomycin  1,500 mg Intravenous Q48H    Time spent on care of this patient: 40 mins   WOODS, Geraldo Docker , MD  [facility] Office  [phone] Pager [phone]  On-Call/Text Page:      [email or web address]      password [email or web address]  If 7PM-7AM, please contact night-coverage [email or web address] Password [email or web address] 09/29/2014, 7:30 AM   LOS: 2 days   Care during the described time interval was provided by me .  I have reviewed this patient's available data, including medical history, events of note, physical examination, and all test results as part of my evaluation. I have personally reviewed and interpreted all radiology studies.   Dia Crawford, MD [phone] Pager

## 2014-09-28 NOTE — Procedures (Signed)
Pt does not care to be placed on her home CPAP machine.  She is resting well on 2L [place].

## 2014-09-28 NOTE — Progress Notes (Signed)
Received a call from blood bank stating that the patient's blood was now ready for administration. Blood will be given on night shift.

## 2014-09-28 NOTE — Progress Notes (Signed)
PULMONARY / CRITICAL CARE MEDICINE   Name: Alexandria [name] MRN: [id number] DOB: 02/17/1936    ADMISSION DATE:  09/27/2014 CONSULTATION DATE:  09/27/2014  REFERRING MD :  ER physician  CHIEF COMPLAINT:  Felt faint, fatigue  INITIAL PRESENTATION: 79 year old female with a past medical history significant for hypertension and diabetes presented to the emergency department on 09/27/2014 after feeling fatigued and noted to be hypotensive at her primary care physician's office.  STUDIES:    SIGNIFICANT EVENTS:  SUBJECTIVE: Family in room. Patient reports feeling better. Denies dysuria or acute discomfort. Denies hx anemia or melena.  VITAL SIGNS: Temp:  [98.3 F (36.8 C)-99.1 F (37.3 C)] 98.3 F (36.8 C) (07/23 0341) Pulse Rate:  [55-103] 85 (07/23 0341) Resp:  [11-28] 20 (07/23 0341) BP: (66-171)/(29-97) 109/45 mmHg (07/23 0341) SpO2:  [90 %-100 %] 100 % (07/23 0726) Weight:  [105.235 kg (232 lb)-108.954 kg (240 lb 3.2 oz)] 108.954 kg (240 lb 3.2 oz) (07/23 0349) HEMODYNAMICS:   VENTILATOR SETTINGS:   INTAKE / OUTPUT:  Intake/Output Summary (Last 24 hours) at 09/28/14 0827 Last data filed at 09/28/14 0600  Gross per 24 hour  Intake   5350 ml  Output   1300 ml  Net   4050 ml    PHYSICAL EXAMINATION: General:  Awake and alert, talking to family, obese, NAD Neuro:  Alert and oriented 4, moves all 4 extremities well, HEENT:  NCAT, mucous membranes not dry Cardiovascular:  Regular rate and rhythm no murmurs gallops rubs. 1+ pretibial edema Lungs:  Clear to auscultation bilaterally Abdomen:  Bowel sounds positive nontender nondistended to deep palpation Musculoskeletal:  Normal bulk and tone Skin:  No rash or skin breakdown, no bruising or petechiae  LABS:  CBC  Recent Labs Lab 09/27/14 1408  WBC 19.2*  HGB 7.6*  HCT 26.6*  PLT 233   Coag's No results for input(s): APTT, INR in the last 168 hours. BMET  Recent Labs Lab 09/27/14 1408  NA 136  K 3.3*  CL  106  CO2 26  BUN 24*  CREATININE 2.33*  GLUCOSE 205*   Electrolytes  Recent Labs Lab 09/27/14 1408 09/27/14 1946  CALCIUM 7.8*  --   MG  --  1.9  PHOS  --  2.6   Sepsis Markers  Recent Labs Lab 09/27/14 1409 09/27/14 1643 09/27/14 1946 09/28/14 0216  LATICACIDVEN 1.78 1.34  --   --   PROCALCITON  --   --  14.05 11.22   ABG No results for input(s): PHART, PCO2ART, PO2ART in the last 168 hours. Liver Enzymes  Recent Labs Lab 09/27/14 1408  AST 18  ALT 13*  ALKPHOS 72  BILITOT 0.6  ALBUMIN 2.8*   Cardiac Enzymes No results for input(s): TROPONINI, PROBNP in the last 168 hours. Glucose  Recent Labs Lab 09/27/14 1823  GLUCAP 146*    Imaging Dg Chest Port 1 View  09/27/2014   CLINICAL DATA:  COPD, dizziness, fever.  EXAM: PORTABLE CHEST - 1 VIEW  COMPARISON:  Chest x-ray dated 07/31/2014.  FINDINGS: Heart size is upper normal, unchanged. Overall cardiomediastinal silhouette is stable in size and configuration. Lungs are clear. Incidental note made of a prominent epicardial fat pad at the left lung base. No evidence of pneumonia. No pleural effusion. No pneumothorax. No acute osseous abnormality.  IMPRESSION: No evidence of acute cardiopulmonary abnormality. No evidence of pneumonia.   Electronically Signed   By: Franki Cabot M.D.   On: 09/27/2014 13:55   Labs reviewed- 7/23-  Pending CBC, CMET  ASSESSMENT / PLAN:  PULMONARY A: COPD not an exacerbation  P:   Monitor O2 saturation Continue home Symbicort  CARDIOVASCULAR A: Hypotension uncertain etiology: She primarily seems hypovolemic and despite the low blood pressure she is feeling well with normal mentation.  Clinically she appears to have a viral prodrome though she does have a urinary tract infection. Though she has a leukocytosis she does not meet SIRS criteria. Her low hemoglobin may be contributing to some degree but she does not appear to be in frank cardiogenic or hemorrhagic shock.  Cortisol  12.6 Procalcitonin 14.05>11.22 P:  Continue IV fluids Close monitoring in the step down unit Resuming ARB (valsartan > avapro formulary subst)   RENAL A:  Acute kidney injury due to hypovolemia P:   IV fluids> slowing to 50 ml/ hr since taking po well Monitor BMET and UOP Replace electrolytes as needed   GASTROINTESTINAL A:  GERD P:   Continue home PPI and H2 blocker Regular diet  HEMATOLOGIC- Iron deficiency A:  Microcytic anemia without evidence of bleeding, uncertain etiology, differential diagnosis includes hemolysis versus less likely viral prodrome (parvo b19?) Versus hemolysis.  Denies bleeding or melena. Denies hx anemia Ferritin 15, Fe low 6, TIBC 316 c/w Iron deficiency anemia Haptoglobin 189 LDH 119 P:  Transfusion for hemoglobin less than 7 Type and screen now Monitor for bleeding- Stool for OB Peripheral smear Check parvo B19-pending   INFECTIOUS A:  Urinary tract infection P:   BCx2 July 22> UC July 22>  Vanc/ Zosyn 1 dose each in the emergency department on July 22 Ceftriaxone started July 22 > Repeat Vanc sched for 7/24  ENDOCRINE A:  Diabetes   Hypothyroidism P:   Sliding scale insulin Continue Synthroid  NEUROLOGIC A:  No acute issues P:  Ok to resume Prozac as requested by family    FAMILY  - Updates: updated at bedside. Very attentive.    CD Young, MD PCCM [phone],   After 3pm or if no response, call [phone]  09/28/2014, 8:27 AM

## 2014-09-29 ENCOUNTER — Encounter (HOSPITAL_COMMUNITY): Payer: Self-pay | Admitting: Internal Medicine

## 2014-09-29 ENCOUNTER — Other Ambulatory Visit (HOSPITAL_COMMUNITY): Payer: BLUE CROSS/BLUE SHIELD

## 2014-09-29 DIAGNOSIS — D62 Acute posthemorrhagic anemia: Secondary | ICD-10-CM | POA: Diagnosis present

## 2014-09-29 DIAGNOSIS — G4733 Obstructive sleep apnea (adult) (pediatric): Secondary | ICD-10-CM | POA: Diagnosis present

## 2014-09-29 DIAGNOSIS — D649 Anemia, unspecified: Secondary | ICD-10-CM | POA: Diagnosis present

## 2014-09-29 DIAGNOSIS — Z9989 Dependence on other enabling machines and devices: Secondary | ICD-10-CM

## 2014-09-29 DIAGNOSIS — E038 Other specified hypothyroidism: Secondary | ICD-10-CM | POA: Diagnosis present

## 2014-09-29 DIAGNOSIS — I9589 Other hypotension: Secondary | ICD-10-CM | POA: Diagnosis present

## 2014-09-29 DIAGNOSIS — N179 Acute kidney failure, unspecified: Secondary | ICD-10-CM | POA: Diagnosis present

## 2014-09-29 DIAGNOSIS — N3000 Acute cystitis without hematuria: Secondary | ICD-10-CM | POA: Diagnosis present

## 2014-09-29 DIAGNOSIS — N390 Urinary tract infection, site not specified: Secondary | ICD-10-CM | POA: Diagnosis present

## 2014-09-29 DIAGNOSIS — E662 Morbid (severe) obesity with alveolar hypoventilation: Secondary | ICD-10-CM | POA: Diagnosis present

## 2014-09-29 DIAGNOSIS — J441 Chronic obstructive pulmonary disease with (acute) exacerbation: Secondary | ICD-10-CM

## 2014-09-29 DIAGNOSIS — I5032 Chronic diastolic (congestive) heart failure: Secondary | ICD-10-CM | POA: Diagnosis present

## 2014-09-29 DIAGNOSIS — J438 Other emphysema: Secondary | ICD-10-CM | POA: Diagnosis present

## 2014-09-29 DIAGNOSIS — F411 Generalized anxiety disorder: Secondary | ICD-10-CM | POA: Diagnosis present

## 2014-09-29 DIAGNOSIS — E1165 Type 2 diabetes mellitus with hyperglycemia: Secondary | ICD-10-CM

## 2014-09-29 LAB — TYPE AND SCREEN
ABO/RH(D): O NEG
Antibody Screen: POSITIVE
DAT, IgG: NEGATIVE
Donor AG Type: NEGATIVE
Donor AG Type: NEGATIVE
PT AG Type: NEGATIVE
Unit division: 0
Unit division: 0

## 2014-09-29 LAB — BASIC METABOLIC PANEL
Anion gap: 8 (ref 5–15)
BUN: 15 mg/dL (ref 6–20)
CO2: 21 mmol/L — ABNORMAL LOW (ref 22–32)
Calcium: 7.7 mg/dL — ABNORMAL LOW (ref 8.9–10.3)
Chloride: 109 mmol/L (ref 101–111)
Creatinine, Ser: 1.27 mg/dL — ABNORMAL HIGH (ref 0.44–1.00)
GFR calc Af Amer: 46 mL/min — ABNORMAL LOW (ref >60)
GFR calc non Af Amer: 40 mL/min — ABNORMAL LOW (ref >60)
Glucose, Bld: 140 mg/dL — ABNORMAL HIGH (ref 65–99)
Potassium: 4.4 mmol/L (ref 3.5–5.1)
Sodium: 138 mmol/L (ref 135–145)

## 2014-09-29 LAB — CBC WITH DIFFERENTIAL/PLATELET
Basophils Absolute: 0 10*3/uL (ref 0.0–0.1)
Basophils Relative: 0 % (ref 0–1)
Eosinophils Absolute: 0.1 10*3/uL (ref 0.0–0.7)
Eosinophils Relative: 1 % (ref 0–5)
HCT: 30.5 % — ABNORMAL LOW (ref 36.0–46.0)
Hemoglobin: 9 g/dL — ABNORMAL LOW (ref 12.0–15.0)
Lymphocytes Relative: 10 % — ABNORMAL LOW (ref 12–46)
Lymphs Abs: 1.1 10*3/uL (ref 0.7–4.0)
MCH: 23.1 pg — ABNORMAL LOW (ref 26.0–34.0)
MCHC: 29.5 g/dL — ABNORMAL LOW (ref 30.0–36.0)
MCV: 78.4 fL (ref 78.0–100.0)
Monocytes Absolute: 1.6 10*3/uL — ABNORMAL HIGH (ref 0.1–1.0)
Monocytes Relative: 14 % — ABNORMAL HIGH (ref 3–12)
Neutro Abs: 8.8 10*3/uL — ABNORMAL HIGH (ref 1.7–7.7)
Neutrophils Relative %: 76 % (ref 43–77)
Platelets: 212 10*3/uL (ref 150–400)
RBC: 3.89 MIL/uL (ref 3.87–5.11)
RDW: 17.6 % — ABNORMAL HIGH (ref 11.5–15.5)
WBC: 11.6 10*3/uL — ABNORMAL HIGH (ref 4.0–10.5)

## 2014-09-29 LAB — GLUCOSE, CAPILLARY
Glucose-Capillary: 131 mg/dL — ABNORMAL HIGH (ref 65–99)
Glucose-Capillary: 213 mg/dL — ABNORMAL HIGH (ref 65–99)
Glucose-Capillary: 147 mg/dL — ABNORMAL HIGH (ref 65–99)
Glucose-Capillary: 138 mg/dL — ABNORMAL HIGH (ref 65–99)

## 2014-09-29 LAB — RHEUMATOID FACTOR: Rhuematoid fact SerPl-aCnc: 16.8 IU/mL — ABNORMAL HIGH (ref 0.0–13.9)

## 2014-09-29 LAB — TSH: TSH: 0.341 u[IU]/mL — ABNORMAL LOW (ref 0.350–4.500)

## 2014-09-29 LAB — HAPTOGLOBIN: Haptoglobin: 247 mg/dL — ABNORMAL HIGH (ref 34–200)

## 2014-09-29 LAB — LIPID PANEL
Cholesterol: 91 mg/dL (ref 0–200)
HDL: 37 mg/dL — ABNORMAL LOW (ref >40)
LDL Cholesterol: 40 mg/dL (ref 0–99)
Total CHOL/HDL Ratio: 2.5 RATIO
Triglycerides: 71 mg/dL (ref <150)
VLDL: 14 mg/dL (ref 0–40)

## 2014-09-29 LAB — PROCALCITONIN: Procalcitonin: 6.67 ng/mL

## 2014-09-29 MED ORDER — INSULIN GLARGINE 100 UNIT/ML ~~LOC~~ SOLN
8.0000 [IU] | Freq: Every day | SUBCUTANEOUS | Status: DC
  Administered 2014-09-29 – 2014-09-30 (×2): 8 [IU] via SUBCUTANEOUS
  Filled 2014-09-29 (×2): qty 0.08

## 2014-09-29 MED ORDER — LEVALBUTEROL HCL 1.25 MG/0.5ML IN NEBU
1.2500 mg | INHALATION_SOLUTION | Freq: Four times a day (QID) | RESPIRATORY_TRACT | Status: DC
  Administered 2014-09-29 – 2014-10-02 (×12): 1.25 mg via RESPIRATORY_TRACT
  Filled 2014-09-29 (×17): qty 0.5

## 2014-09-29 MED ORDER — DM-GUAIFENESIN ER 30-600 MG PO TB12
1.0000 | ORAL_TABLET | Freq: Two times a day (BID) | ORAL | Status: DC
  Administered 2014-09-29 – 2014-10-02 (×7): 1 via ORAL
  Filled 2014-09-29 (×8): qty 1

## 2014-09-29 MED ORDER — ALPRAZOLAM 0.5 MG PO TABS
0.5000 mg | ORAL_TABLET | Freq: Two times a day (BID) | ORAL | Status: DC | PRN
  Administered 2014-09-29: 0.5 mg via ORAL
  Filled 2014-09-29: qty 1

## 2014-09-29 MED ORDER — METHYLPREDNISOLONE SODIUM SUCC 125 MG IJ SOLR
60.0000 mg | INTRAMUSCULAR | Status: DC
  Administered 2014-09-29: 60 mg via INTRAVENOUS
  Filled 2014-09-29: qty 0.96
  Filled 2014-09-29: qty 2

## 2014-09-29 MED ORDER — LORAZEPAM 0.5 MG PO TABS
0.5000 mg | ORAL_TABLET | Freq: Every morning | ORAL | Status: DC
  Administered 2014-09-30 – 2014-10-02 (×3): 0.5 mg via ORAL
  Filled 2014-09-29 (×3): qty 1

## 2014-09-29 MED ORDER — FLUOXETINE HCL 20 MG PO CAPS
30.0000 mg | ORAL_CAPSULE | Freq: Two times a day (BID) | ORAL | Status: DC
  Administered 2014-09-30 – 2014-10-02 (×5): 30 mg via ORAL
  Filled 2014-09-29 (×6): qty 1

## 2014-09-29 NOTE — Evaluation (Signed)
Physical Therapy Evaluation Patient Details Name: Alexandria [name] MRN: [id number] DOB: Nov 18, 1935 Today's Date: 09/29/2014   History of Present Illness  This is a pleasant 79 year old female with a past medical history significant for hypertension, hypothyroidism, and diabetes 2 came to the [facility] [place] emergency department on 09/27/2014 from her primary care physician's office. She been experiencing a fever to 102, body aches, and generalized weakness for 1-2 days. She also noted some burning on urination; low Hgb  Clinical Impression  Pt admitted with above diagnosis. Pt currently with functional limitations due to the deficits listed below (see PT Problem List).  Pt will benefit from skilled PT to increase their independence and safety with mobility to allow discharge to the venue listed below.       Follow Up Recommendations SNF;Supervision/Assistance - 24 hour (Lengthy discussion re: dc options; Ultimately, I endorse going to SNF for postacute rehab to maximize independence and safety with mobility prior to dc home; pt may choose to go home, in which case I recommend HHPT/OT/RN and Aide)    Equipment Recommendations  Rolling walker with 5" wheels;3in1 (PT) (Oxygen)    Recommendations for Other Services OT consult     Precautions / Restrictions Precautions Precautions: Fall Precaution Comments: watch O2 sats      Mobility  Bed Mobility Overal bed mobility: Needs Assistance Bed Mobility: Supine to Sit     Supine to sit: Min assist     General bed mobility comments: Handheld assist to pull to sit  Transfers Overall transfer level: Needs assistance Equipment used: Rolling walker (2 wheeled) Transfers: Sit to/from Stand Sit to Stand: Mod assist         General transfer comment: Mod assist to power-up to stand; decreased control of descent to sit  Ambulation/Gait Ambulation/Gait assistance: Min assist;+2 safety/equipment Ambulation Distance (Feet): 75 Feet Assistive  device: Rolling walker (2 wheeled) Gait Pattern/deviations: Step-through pattern;Decreased step length - right;Decreased step length - left;Decreased stride length;Trunk flexed     General Gait Details: Cues to self-monitor for activity tolerance  Stairs            Wheelchair Mobility    Modified Rankin (Stroke Patients Only)       Balance Overall balance assessment: Needs assistance           Standing balance-[name]: Poor                               Pertinent Vitals/Pain Pain Assessment: No/denies pain    Home Living Family/patient expects to be discharged to:: Private residence Living Arrangements: Spouse/significant other Available Help at Discharge: Family;Available PRN/intermittently Type of Home: House Home Access: Stairs to enter Entrance Stairs-Rails: None Entrance Stairs-Number of Steps: 4 Home Layout: Able to live on main level with bedroom/bathroom Home Equipment: Cane - single point;Transport chair      Prior Function Level of Independence: Independent         Comments: has a cane; reports she doesn't use it; was driving up until about 6 months ago; sleeps in recliner     Hand Dominance        Extremity/Trunk Assessment   Upper Extremity Assessment: Overall WFL for tasks assessed           Lower Extremity Assessment: Generalized weakness         Communication   Communication: No difficulties  Cognition Arousal/Alertness: Awake/alert Behavior During Therapy: WFL for tasks assessed/performed Overall Cognitive Status: Within  Functional Limits for tasks assessed                      General Comments General comments (skin integrity, edema, etc.): See other note of this date for Oxygen qualifying    Exercises        Assessment/Plan    PT Assessment Patient needs continued PT services  PT Diagnosis Difficulty walking;Generalized weakness;Other (comment) (Decreased functional capacity)   PT  Problem List Decreased strength;Decreased activity tolerance;Decreased balance;Decreased mobility;Decreased coordination;Decreased knowledge of use of DME;Cardiopulmonary status limiting activity;Decreased knowledge of precautions  PT Treatment Interventions DME instruction;Gait training;Stair training;Functional mobility training;Therapeutic activities;Therapeutic exercise;Balance training;Patient/family education   PT Goals (Current goals can be found in the Care Plan section) Acute Rehab PT Goals Patient Stated Goal: REALLY wanting to go home PT Goal Formulation: With patient Time For Goal Achievement: 10/13/14 Potential to Achieve Goals: Good    Frequency Min 3X/week   Barriers to discharge        Co-evaluation               End of Session Equipment Utilized During Treatment: Gait belt;Oxygen Activity Tolerance: Patient tolerated treatment well Patient left: in chair;with call bell/phone within reach;with family/visitor present Nurse Communication: Mobility status         Time: 1339-1415 (minus approx 5 minutes while pt was on teh commode) PT Time Calculation (min) (ACUTE ONLY): 36 min   Charges:   PT Evaluation $Initial PT Evaluation Tier I: 1 Procedure PT Treatments $Gait Training: 8-22 mins   PT G CodesQuin Hoop 09/29/2014, 4:20 PM  Roney Marion, [place] Pager [phone] Office [phone]

## 2014-09-29 NOTE — Progress Notes (Signed)
[facility] TEAM 1 - Stepdown/ICU TEAM Progress Note  Alexandria SLADEK DQQ:[id number] DOB: 06/20/1935 DOA: 09/27/2014 PCP: Elsie Stain, MD  Admit HPI / Brief Narrative: 79 year old WF PMHx Depression, HTN, HLD, COPD, OSA, GERD, Diabetes Type 2, Peripheral Neuropathy,   Came to the [facility] emergency department on 09/27/2014 from her primary care physician's office. She been experiencing a fever to 102, body aches, and generalized weakness for 1-2 days. She also noted some burning on urination. She been trying to eat and drink normally and she said her appetite had been well. She did not have diarrhea, nausea, vomiting, or chest pain. She had been coughing for about 1 week prior to admission. Notably, one week ago she was seen in emergency department for similar symptoms (low blood pressure, fatigue) [place]. She was treated briefly with fluids and was discharged. She said that none of her medications a change recently and she continues to take her losartan. She does not take any diuretics. She denies sick contacts. She denies rash.  HPI/Subjective: 7/24 A/O 4, NAD,. States increasing DOE over several months. States just evaluated by Dr. Halford Chessman [facility]) with pulmonary function test; results unknown. States negative bright red blood per rectum/melanoma. States reason for hospitalization was increasing fatigue with presyncopal events (Dizzy/lightheaded). States feels as if she needs to be on home O2 but has not been placed on by PCP or PCCM yet.  Assessment/Plan: COPD Exacerbation    -Most Likely Viral.  -Obtain Ambulatory SPO2  -Monitor O2 saturation -Continue home Symbicort -Solumedrol '60mg'$  Daily  -Xopenex QID -Respiratory virus panel, strep pneumo urine antigen, Legionella urine antigen, influenza panel, and a panel, pending  OSA/Obesity Hypoventilation Syndrome -CPAP per respiratory  Diastolic CHF -Echo 19/06/1738; LVEF= 81-44%, grade 1 diastolic  dysfunction -Repeat echocardiogram -Transfuse for hemoglobin<8 -7/23 Transfuse 2 units PRBC  Hypotension uncertain etiology:  -Most likely multifactorial to include anemia, OHS, OSA, UTI  -Resolved  Acute renal failure due to hypovolemia (baseline Cr~1.07) -Improving,Cr  currently 1.59 -Continue normal saline at 50 ml/hr   Urinary tract infection/GNR Bacteremia vs contaminant? -Urine culture insignificant growth -Leukocytosis resolving -Continue ceftriaxone for complete 5 day course  Continue home PPI and H2 blocker -Regular diet -Protonix 40 mg BID -Famotidine 20 mg QHS  Anemia Hemolysis vs less likely viral prodrome (parvo b19?)  vs hemolysis -7/23 Transfuse 2 units PRBC -Transfusion for hemoglobin< 8 -Anemia panel pending -Haptoglobin pending -LDH pending -Parvo B19 pending  DiabetesType 2  -A1c pending -Lipid panel pending -Increase Resistant SSI -Start Lantus 8 units Daily  Hypothyroidism -Continue Synthroid 150 g daily -TSH pending  Anxiety -Increase Prozac 30 mg BID -D/C  Xanax to 0.5 BID -Start Ativan 0.5 mg QAm   Code Status: FULL Family Communication:  family present at time of exam Disposition Plan: Next 24-48 hours     Consultants: Dr.Clinton D Young [facility])   Procedure/Significant Events:    Culture 7/22 blood left hand NGTD; 7/22 blood positive in Rt AC GNR 7/22 urine insignificant   Antibiotics:  Zosyn 1 dose in ED Ceftriaxone 7/22>>   DVT prophylaxis: SCD   Devices    LINES / TUBES:      Continuous Infusions: . sodium chloride 50 mL/hr at 09/28/14 2222    Objective: VITAL SIGNS: Temp: 97.6 F (36.4 C) (07/24 1212) Temp Source: Oral (07/24 1212) BP: 146/70 mmHg (07/24 1212) Pulse Rate: 75 (07/24 0600) SPO2; FIO2:   Intake/Output Summary (Last 24 hours) at 09/29/14 1239 Last data  filed at 09/29/14 1100  Gross per 24 hour  Intake   1520 ml  Output      0 ml  Net   1520 ml      Exam: General:A/O 4, NAD,. Patient with apparent increased WOB (purses lips when breathing), Acute respiratory distress Eyes: Negative headache, eye pain, double vision, negative scleral hemorrhage ENT: Negative Runny nose, negative ear pain, negative tinnitus, negative gingival bleeding Neck:  Negative scars, masses, torticollis, lymphadenopathy, JVD Lungs: Clear to auscultation bilaterally without wheezes or crackles Cardiovascular: Regular rate and rhythm without murmur gallop or rub normal S1 and S2 Abdomen:negative abdominal pain, negative dysphagia, Nontender, nondistended, soft, bowel sounds positive, no rebound, no ascites, no appreciable mass Extremities: No significant cyanosis, clubbing, or edema bilateral lower extremities Psychiatric:  Negative depression, negative anxiety, negative fatigue, negative mania  Neurologic:  Cranial nerves II through XII intact, tongue/uvula midline, all extremities muscle strength 5/5, sensation intact throughout, negative dysarthria, negative expressive aphasia, negative receptive aphasia.      Data Reviewed: Basic Metabolic Panel:  Recent Labs Lab 09/27/14 1408 09/27/14 1946 09/28/14 0930 09/29/14 0331  NA 136  --  139 138  K 3.3*  --  4.0 4.4  CL 106  --  111 109  CO2 26  --  23 21*  GLUCOSE 205*  --  128* 140*  BUN 24*  --  19 15  CREATININE 2.33*  --  1.59* 1.27*  CALCIUM 7.8*  --  7.3* 7.7*  MG  --  1.9 1.9  --   PHOS  --  2.6  --   --    Liver Function Tests:  Recent Labs Lab 09/27/14 1408 09/28/14 0930  AST 18 20  ALT 13* 13*  ALKPHOS 72 67  BILITOT 0.6 0.4  PROT 5.7* 5.5*  ALBUMIN 2.8* 2.3*   No results for input(s): LIPASE, AMYLASE in the last 168 hours. No results for input(s): AMMONIA in the last 168 hours. CBC:  Recent Labs Lab 09/27/14 1408 09/28/14 0930 09/29/14 0331  WBC 19.2* 12.4* 11.6*  NEUTROABS 17.3* 10.2* 8.8*  HGB 7.6* 7.0* 9.0*  HCT 26.6* 25.3* 30.5*  MCV 77.8* 77.8* 78.4  PLT 233  192 212   Cardiac Enzymes: No results for input(s): CKTOTAL, CKMB, CKMBINDEX, TROPONINI in the last 168 hours. BNP (last 3 results) No results for input(s): BNP in the last 8760 hours.  ProBNP (last 3 results) No results for input(s): PROBNP in the last 8760 hours.  CBG:  Recent Labs Lab 09/28/14 1300 09/28/14 1657 09/28/14 2139 09/29/14 0755 09/29/14 1229  GLUCAP 149* 120* 178* 131* 213*    Recent Results (from the past 240 hour(s))  Blood Culture (routine x 2)     Status: None (Preliminary result)   Collection Time: 09/27/14  1:57 PM  Result Value Ref Range Status   Specimen Description BLOOD LEFT HAND  Final   Special Requests BOTTLES DRAWN AEROBIC AND ANAEROBIC 3CC  Final   Culture NO GROWTH < 24 HOURS  Final   Report Status PENDING  Incomplete  Blood Culture (routine x 2)     Status: None (Preliminary result)   Collection Time: 09/27/14  2:00 PM  Result Value Ref Range Status   Specimen Description BLOOD RIGHT ANTECUBITAL  Final   Special Requests BOTTLES DRAWN AEROBIC AND ANAEROBIC 10ML  Final   Culture  Setup Time   Final    GRAM NEGATIVE RODS AEROBIC BOTTLE ONLY CRITICAL RESULT CALLED TO, READ BACK BY AND VERIFIED  WITH: Melody Comas RN [id number] 09/29/14 A BROWNING    Culture NO GROWTH < 24 HOURS  Final   Report Status PENDING  Incomplete  Urine culture     Status: None   Collection Time: 09/27/14  2:26 PM  Result Value Ref Range Status   Specimen Description URINE, CLEAN CATCH  Final   Special Requests NONE  Final   Culture 5,000 COLONIES/mL INSIGNIFICANT GROWTH  Final   Report Status 09/28/2014 FINAL  Final  MRSA PCR Screening     Status: None   Collection Time: 09/27/14  6:27 PM  Result Value Ref Range Status   MRSA by PCR NEGATIVE NEGATIVE Final    Comment:        The GeneXpert MRSA Assay (FDA approved for NASAL specimens only), is one component of a comprehensive MRSA colonization surveillance program. It is not intended to diagnose MRSA infection nor to  guide or monitor treatment for MRSA infections.      Studies:  Recent x-ray studies have been reviewed in detail by the Attending Physician  Scheduled Meds:  Scheduled Meds: . sodium chloride   Intravenous Once  . sodium chloride   Intravenous Once  . budesonide-formoterol  2 puff Inhalation BID  . cefTRIAXone (ROCEPHIN)  IV  1 g Intravenous Q24H  . famotidine  20 mg Oral QHS  . FLUoxetine  20 mg Oral BID  . insulin aspart  0-15 Units Subcutaneous TID WC  . insulin aspart  0-5 Units Subcutaneous QHS  . irbesartan  75 mg Oral Daily  . levothyroxine  150 mcg Oral QAC breakfast  . pantoprazole (PROTONIX) IV  40 mg Intravenous Q12H  . rosuvastatin  10 mg Oral QHS  . vancomycin  1,500 mg Intravenous Q48H    Time spent on care of this patient: 40 mins   WOODS, Geraldo Docker , MD  [facility] Office  [phone] Pager [phone]  On-Call/Text Page:      [email or web address]      password [email or web address]  If 7PM-7AM, please contact night-coverage [email or web address] Password [facility] 09/29/2014, 12:39 PM   LOS: 2 days   Care during the described time interval was provided by me .  I have reviewed this patient's available data, including medical history, events of note, physical examination, and all test results as part of my evaluation. I have personally reviewed and interpreted all radiology studies.   Dia Crawford, MD [phone] Pager

## 2014-09-29 NOTE — Progress Notes (Signed)
Physical Therapy Treatment Note  Clinical Impression: SATURATION QUALIFICATIONS: (This note is used to comply with regulatory documentation for home oxygen)  Patient Saturations on Room Air at Rest = 92%  Patient Saturations on Room Air while Ambulating = 85%  Patient Saturations on 2 Liters of oxygen while Ambulating = 97%  Please briefly explain why patient needs home oxygen: Patient requires supplemental oxygen to maintain oxygen saturations at acceptable, safe levels with physical activity.   Roney Marion, [place]  Acute Rehabilitation Services Pager [phone] Office [phone]

## 2014-09-30 ENCOUNTER — Telehealth: Payer: Self-pay | Admitting: Pulmonary Disease

## 2014-09-30 ENCOUNTER — Ambulatory Visit: Payer: BLUE CROSS/BLUE SHIELD | Admitting: Pulmonary Disease

## 2014-09-30 ENCOUNTER — Inpatient Hospital Stay (HOSPITAL_COMMUNITY): Payer: BLUE CROSS/BLUE SHIELD

## 2014-09-30 DIAGNOSIS — D509 Iron deficiency anemia, unspecified: Secondary | ICD-10-CM | POA: Diagnosis present

## 2014-09-30 DIAGNOSIS — E119 Type 2 diabetes mellitus without complications: Secondary | ICD-10-CM

## 2014-09-30 DIAGNOSIS — N390 Urinary tract infection, site not specified: Secondary | ICD-10-CM | POA: Diagnosis present

## 2014-09-30 LAB — EXPECTORATED SPUTUM ASSESSMENT W GRAM STAIN, RFLX TO RESP C

## 2014-09-30 LAB — PARVOVIRUS B19 ANTIBODY, IGG AND IGM
Parovirus B19 IgG Abs: 0.3 index (ref 0.0–0.8)
Parovirus B19 IgM Abs: 0.3 index (ref 0.0–0.8)

## 2014-09-30 LAB — GLUCOSE, CAPILLARY
Glucose-Capillary: 169 mg/dL — ABNORMAL HIGH (ref 65–99)
Glucose-Capillary: 171 mg/dL — ABNORMAL HIGH (ref 65–99)
Glucose-Capillary: 172 mg/dL — ABNORMAL HIGH (ref 65–99)
Glucose-Capillary: 207 mg/dL — ABNORMAL HIGH (ref 65–99)
Glucose-Capillary: 207 mg/dL — ABNORMAL HIGH (ref 65–99)

## 2014-09-30 LAB — INFLUENZA PANEL BY PCR (TYPE A & B)
H1N1 flu by pcr: NOT DETECTED
Influenza A By PCR: NEGATIVE
Influenza B By PCR: NEGATIVE

## 2014-09-30 LAB — EXPECTORATED SPUTUM ASSESSMENT W REFEX TO RESP CULTURE

## 2014-09-30 LAB — STREP PNEUMONIAE URINARY ANTIGEN: Strep Pneumo Urinary Antigen: NEGATIVE

## 2014-09-30 LAB — HEMOGLOBIN A1C
Hgb A1c MFr Bld: 6.1 % — ABNORMAL HIGH (ref 4.8–5.6)
Mean Plasma Glucose: 128 mg/dL

## 2014-09-30 MED ORDER — PREDNISONE 50 MG PO TABS
75.0000 mg | ORAL_TABLET | Freq: Every day | ORAL | Status: DC
  Administered 2014-09-30 – 2014-10-02 (×3): 75 mg via ORAL
  Filled 2014-09-30 (×4): qty 1

## 2014-09-30 MED ORDER — CEFUROXIME AXETIL 500 MG PO TABS
500.0000 mg | ORAL_TABLET | Freq: Two times a day (BID) | ORAL | Status: DC
  Administered 2014-09-30 – 2014-10-02 (×5): 500 mg via ORAL
  Filled 2014-09-30 (×7): qty 1

## 2014-09-30 MED ORDER — INSULIN ASPART 100 UNIT/ML ~~LOC~~ SOLN
0.0000 [IU] | SUBCUTANEOUS | Status: DC
  Administered 2014-09-30 – 2014-10-01 (×2): 4 [IU] via SUBCUTANEOUS
  Administered 2014-10-01 (×2): 3 [IU] via SUBCUTANEOUS
  Administered 2014-10-01 (×2): 4 [IU] via SUBCUTANEOUS
  Administered 2014-10-01: 7 [IU] via SUBCUTANEOUS
  Administered 2014-10-02 (×2): 3 [IU] via SUBCUTANEOUS

## 2014-09-30 MED ORDER — PANTOPRAZOLE SODIUM 40 MG PO TBEC
40.0000 mg | DELAYED_RELEASE_TABLET | Freq: Two times a day (BID) | ORAL | Status: DC
  Administered 2014-09-30 – 2014-10-02 (×5): 40 mg via ORAL
  Filled 2014-09-30 (×5): qty 1

## 2014-09-30 MED ORDER — INSULIN GLARGINE 100 UNIT/ML ~~LOC~~ SOLN
16.0000 [IU] | Freq: Every day | SUBCUTANEOUS | Status: DC
  Administered 2014-10-01 – 2014-10-02 (×2): 16 [IU] via SUBCUTANEOUS
  Filled 2014-09-30 (×2): qty 0.16

## 2014-09-30 NOTE — Telephone Encounter (Signed)
Called spoke with daughter. She reports pt is currently admitted and would like for him to stop by and see patient as well. Dr. Annamaria Boots and Dr. Lake Bells has also seen patient. I advised will let Dr. Halford Chessman know as he is here in office today.

## 2014-09-30 NOTE — Progress Notes (Signed)
IV's were unable to be restarted by IV team. Paged Dr. Sherral Hammers to find out what he would like to do as the patient is without IV access at the time. He states he would just leave the IV's out for now. He did not advise a PICC line.

## 2014-09-30 NOTE — Telephone Encounter (Signed)
Left detailed message making daughter aware that Dr Halford Chessman will stop by while doing rounds tonight at [facility]. Nothing further needed.

## 2014-09-30 NOTE — Progress Notes (Signed)
[place] TEAM 1 - Stepdown/ICU TEAM Progress Note  Alexandria [name] EAV:[id number] DOB: 1936/03/02 DOA: 09/27/2014 PCP: Elsie Stain, MD  Admit HPI / Brief Narrative: 79 year old WF PMHx Depression, HTN, HLD, COPD, OSA, GERD, Diabetes Type 2, Peripheral Neuropathy,   Came to the [facility] emergency department on 09/27/2014 from her primary care physician's office. She been experiencing a fever to 102, body aches, and generalized weakness for 1-2 days. She also noted some burning on urination. She been trying to eat and drink normally and she said her appetite had been well. She did not have diarrhea, nausea, vomiting, or chest pain. She had been coughing for about 1 week prior to admission. Notably, one week ago she was seen in emergency department for similar symptoms (low blood pressure, fatigue) [place] while [place]. She was treated briefly with fluids and was discharged. She said that none of her medications a change recently and she continues to take her losartan. She does not take any diuretics. She denies sick contacts. She denies rash.  HPI/Subjective: 7/25 A/O 4, NAD,. States feels breathing is significantly improved. States slept soundly overnight , and feels much improved. States her daughter called today and counseled follow-up with  Dr. Halford Chessman [facility] M).    Assessment/Plan: COPD Exacerbation    -Most Likely Viral.  -Ambulatory SPO2; shows patient qualifies for home O2 see ambulatory SPO2 below.  -Monitor O2 saturation titrate to maintain SPO2 89 and 93% -Continue home Symbicort 160-4.5 BID -IV access lost will change Solumedrol '60mg'$  Daily to Prednisone 75 mg daily -Continue Xopenex QID -Respiratory virus panel, Legionella urine antigen, influenza panel, and a panel, pending  OSA/Obesity Hypoventilation Syndrome -CPAP per respiratory  Chronic Diastolic CHF -Echo 78/04/9560; LVEF= 13-08%, grade 1 diastolic dysfunction -Repeat echocardiogram; showed LVH/mildly  dilated left atrium see results below -Transfuse for hemoglobin<8 -7/23 Transfuse 2 units PRBC  Hypotension uncertain etiology:  -Most likely multifactorial to include anemia, OHS, OSA, UTI  -Resolved  Acute renal failure due to hypovolemia (baseline Cr~1.07) -Improving,Cr  currently 1.59 -Continue normal saline at 50 ml/hr   Urinary tract infection/GNR Bacteremia vs contaminant? -Urine culture insignificant growth -Leukocytosis resolving - complete 5 day course ABX  Continue home PPI and H2 blocker -Regular diet -Protonix 40 mg BID -Famotidine 20 mg QHS  Microcytic anemia  -7/23 Transfuse 2 units PRBC -Transfusion for hemoglobin< 8 -Anemia panel; C/W Fe deficiency anemia -Haptoglobin low -LDH normal -Parvo B19 pending  DiabetesType 2 controlled -7/24 hemoglobin A1c= 6.1 -Lipid panel within ADA guidelines -Continue Resistant SSI -Increase Lantus 16 units Daily  Hypothyroidism -Continue Synthroid 150 g daily -TSH pending  Anxiety -Increase Prozac 30 mg BID -D/C  Xanax to 0.5 BID -Continue Ativan 0.5 mg QAm   Code Status: FULL Family Communication:  family present at time of exam Disposition Plan: Next 24-48 hours     Consultants: Dr.Clinton D Young [facility] M)   Procedure/Significant Events: 7/23 Echocardiogram;- Left ventricle: mild LVH. -LVEF= 55% to 60%.-Regional wall motion abnormalities cannot be excluded. - Left atrium: mildly dilated. 7/24 ambulatory SPO2; SATURATION QUALIFICATIONS: (This note is used to comply with regulatory documentation for home oxygen) Patient Saturations on Room Air at Rest = 92% Patient Saturations on Room Air while Ambulating = 85% Patient Saturations on 2 Liters of oxygen while Ambulating = 97%      Culture 7/22 blood left hand NGTD; 7/22 blood positive in Rt AC GNR 7/22 urine insignificant 7/24 influenza panel negative 7/25 strep pneumo urine antigen negative  Antibiotics:  Zosyn 1 dose in ED Ceftriaxone  7/22>> stopped 7/25 Ceftin 7/25>> Vancomycin  >>> stopped 7/25  DVT prophylaxis: SCD   Devices    LINES / TUBES:      Continuous Infusions: . sodium chloride 50 mL/hr at 09/29/14 2159    Objective: VITAL SIGNS: Temp: 97.3 F (36.3 C) (07/25 1714) Temp Source: Oral (07/25 1714) BP: 143/54 mmHg (07/25 1714) Pulse Rate: 72 (07/25 1714) SPO2; FIO2:   Intake/Output Summary (Last 24 hours) at 09/30/14 2040 Last data filed at 09/30/14 1833  Gross per 24 hour  Intake    840 ml  Output   1225 ml  Net   -385 ml     Exam: General:A/O 4, NAD,. Patient with decreased WOB compared to previous days, no Acute respiratory distress Eyes: Negative headache, eye pain, double vision, negative scleral hemorrhage ENT: Negative Runny nose, negative ear pain, negative tinnitus, negative gingival bleeding Neck:  Negative scars, masses, torticollis, lymphadenopathy, JVD Lungs: diffuse mild wheezing, negative crackles (significantly improved)  Cardiovascular: Regular rate and rhythm without murmur gallop or rub normal S1 and S2 Abdomen:negative abdominal pain, negative dysphagia, Nontender, nondistended, soft, bowel sounds positive, no rebound, no ascites, no appreciable mass Extremities: No significant cyanosis, clubbing, or edema bilateral lower extremities Psychiatric:  Negative depression, negative anxiety, negative fatigue, negative mania  Neurologic:  Cranial nerves II through XII intact, tongue/uvula midline, all extremities muscle strength 5/5, sensation intact throughout, negative dysarthria, negative expressive aphasia, negative receptive aphasia.      Data Reviewed: Basic Metabolic Panel:  Recent Labs Lab 09/27/14 1408 09/27/14 1946 09/28/14 0930 09/29/14 0331  NA 136  --  139 138  K 3.3*  --  4.0 4.4  CL 106  --  111 109  CO2 26  --  23 21*  GLUCOSE 205*  --  128* 140*  BUN 24*  --  19 15  CREATININE 2.33*  --  1.59* 1.27*  CALCIUM 7.8*  --  7.3* 7.7*  MG  --   1.9 1.9  --   PHOS  --  2.6  --   --    Liver Function Tests:  Recent Labs Lab 09/27/14 1408 09/28/14 0930  AST 18 20  ALT 13* 13*  ALKPHOS 72 67  BILITOT 0.6 0.4  PROT 5.7* 5.5*  ALBUMIN 2.8* 2.3*   No results for input(s): LIPASE, AMYLASE in the last 168 hours. No results for input(s): AMMONIA in the last 168 hours. CBC:  Recent Labs Lab 09/27/14 1408 09/28/14 0930 09/29/14 0331  WBC 19.2* 12.4* 11.6*  NEUTROABS 17.3* 10.2* 8.8*  HGB 7.6* 7.0* 9.0*  HCT 26.6* 25.3* 30.5*  MCV 77.8* 77.8* 78.4  PLT 233 192 212   Cardiac Enzymes: No results for input(s): CKTOTAL, CKMB, CKMBINDEX, TROPONINI in the last 168 hours. BNP (last 3 results) No results for input(s): BNP in the last 8760 hours.  ProBNP (last 3 results) No results for input(s): PROBNP in the last 8760 hours.  CBG:  Recent Labs Lab 09/29/14 1643 09/29/14 2155 09/30/14 0818 09/30/14 1304 09/30/14 1717  GLUCAP 147* 138* 207* 207* 172*    Recent Results (from the past 240 hour(s))  Blood Culture (routine x 2)     Status: None (Preliminary result)   Collection Time: 09/27/14  1:57 PM  Result Value Ref Range Status   Specimen Description BLOOD LEFT HAND  Final   Special Requests BOTTLES DRAWN AEROBIC AND ANAEROBIC 3CC  Final   Culture NO GROWTH 3 DAYS  Final  Report Status PENDING  Incomplete  Blood Culture (routine x 2)     Status: None (Preliminary result)   Collection Time: 09/27/14  2:00 PM  Result Value Ref Range Status   Specimen Description BLOOD RIGHT ANTECUBITAL  Final   Special Requests BOTTLES DRAWN AEROBIC AND ANAEROBIC 10ML  Final   Culture  Setup Time   Final    GRAM NEGATIVE RODS AEROBIC BOTTLE ONLY CRITICAL RESULT CALLED TO, READ BACK BY AND VERIFIED WITH: Melody Comas RN [id number] 09/29/14 A BROWNING    Culture GRAM NEGATIVE RODS  Final   Report Status PENDING  Incomplete  Urine culture     Status: None   Collection Time: 09/27/14  2:26 PM  Result Value Ref Range Status   Specimen  Description URINE, CLEAN CATCH  Final   Special Requests NONE  Final   Culture 5,000 COLONIES/mL INSIGNIFICANT GROWTH  Final   Report Status 09/28/2014 FINAL  Final  MRSA PCR Screening     Status: None   Collection Time: 09/27/14  6:27 PM  Result Value Ref Range Status   MRSA by PCR NEGATIVE NEGATIVE Final    Comment:        The GeneXpert MRSA Assay (FDA approved for NASAL specimens only), is one component of a comprehensive MRSA colonization surveillance program. It is not intended to diagnose MRSA infection nor to guide or monitor treatment for MRSA infections.   Culture, expectorated sputum-assessment     Status: None   Collection Time: 09/30/14 10:30 AM  Result Value Ref Range Status   Specimen Description EXPECTORATED SPUTUM  Final   Special Requests NONE  Final   Sputum evaluation   Final    MICROSCOPIC FINDINGS SUGGEST THAT THIS SPECIMEN IS NOT REPRESENTATIVE OF LOWER RESPIRATORY SECRETIONS. PLEASE RECOLLECT. Results Called to: G WHITE 09/30/14 @ 1255 M VESTAL    Report Status 09/30/2014 FINAL  Final     Studies:  Recent x-ray studies have been reviewed in detail by the Attending Physician  Scheduled Meds:  Scheduled Meds: . sodium chloride   Intravenous Once  . sodium chloride   Intravenous Once  . budesonide-formoterol  2 puff Inhalation BID  . cefUROXime  500 mg Oral BID WC  . dextromethorphan-guaiFENesin  1 tablet Oral BID  . famotidine  20 mg Oral QHS  . FLUoxetine  30 mg Oral BID  . insulin aspart  0-20 Units Subcutaneous 6 times per day  . insulin aspart  0-5 Units Subcutaneous QHS  . [START ON 10/01/2014] insulin glargine  16 Units Subcutaneous Daily  . irbesartan  75 mg Oral Daily  . levalbuterol  1.25 mg Nebulization 4 times per day  . levothyroxine  150 mcg Oral QAC breakfast  . LORazepam  0.5 mg Oral q morning - 10a  . pantoprazole  40 mg Oral BID  . predniSONE  75 mg Oral Q breakfast  . rosuvastatin  10 mg Oral QHS    Time spent on care of  this patient: 40 mins   Dorothea Yow, Geraldo Docker , MD  [facility] Office  [phone] Pager - [phone]  On-Call/Text Page:      [email or web address]      password [email or web address]  If 7PM-7AM, please contact night-coverage [email or web address] Password [email or web address] 09/30/2014, 8:40 PM   LOS: 3 days   Care during the described time interval was provided by me .  I have reviewed this patient's available data, including medical history, events of note, physical examination, and all test results as part of  my evaluation. I have personally reviewed and interpreted all radiology studies.   Dia Crawford, MD [phone] Pager

## 2014-09-30 NOTE — Clinical Social Work Placement (Signed)
   CLINICAL SOCIAL WORK PLACEMENT  NOTE  Date:  09/30/2014  Patient Details  Name: Alexandria [name] MRN: [id number] Date of Birth: [birth year]  Clinical Social Work is seeking post-discharge placement for this patient at the [place] level of care (*CSW will initial, date and re-position this form in  chart as items are completed):  Yes   Patient/family provided with [facility] Work Department's list of facilities offering this level of care within the geographic area requested by the patient (or if unable, by the patient's family).  Yes   Patient/family informed of their freedom to choose among providers that offer the needed level of care, that participate in Medicare, Medicaid or managed care program needed by the patient, have an available bed and are willing to accept the patient.  Yes   Patient/family informed of [facility]'s ownership interest in [facility] and [facility], as well as of the fact that they are under no obligation to receive care at these facilities.  PASRR submitted to EDS on 09/30/14     PASRR number received on 09/30/14     Existing PASRR number confirmed on       FL2 transmitted to all facilities in geographic area requested by pt/family on 09/30/14     FL2 transmitted to all facilities within larger geographic area on       Patient informed that his/her managed care company has contracts with or will negotiate with certain facilities, including the following:            Patient/family informed of bed offers received.  Patient chooses bed at       Physician recommends and patient chooses bed at      Patient to be transferred to   on  .  Patient to be transferred to facility by       Patient family notified on   of transfer.  Name of family member notified:        PHYSICIAN Please sign FL2     Additional Comment:    _______________________________________________ Cranford Mon, LCSW 09/30/2014,  12:29 PM

## 2014-09-30 NOTE — Progress Notes (Signed)
Occupational Therapy Evaluation Patient Details Name: Alexandria [name] MRN: [id number] DOB: 02-04-1936 Today's Date: 09/30/2014    History of Present Illness This is a pleasant 79 year old female with a past medical history significant for hypertension, hypothyroidism, and diabetes 2 came to the [facility] emergency department on 09/27/2014 from her primary care physician's office. She been experiencing a fever to 102, body aches, and generalized weakness for 1-2 days. She also noted some burning on urination; low Hgb   Clinical Impression   PTA, pt mod I with mobility and ADL. Pt presents with below deficits and would benefit from short stay at SNF to maximize functional level of independence to facilitate safe return home @ mod I level. Pt in agreement with rehab at [facility]). Will follow acutely to address established goals.     Follow Up Recommendations  SNF;Supervision/Assistance - 24 hour    Equipment Recommendations  3 in 1 bedside comode;Tub/shower seat    Recommendations for Other Services       Precautions / Restrictions Precautions Precautions: Fall Precaution Comments: watch O2 sats      Mobility Bed Mobility               General bed mobility comments: pt up in chair  Transfers Overall transfer level: Needs assistance Equipment used: Rolling walker (2 wheeled) Transfers: Sit to/from Stand Sit to Stand: Min assist         General transfer comment: min A to stand     Balance Overall balance assessment: Needs assistance           Standing balance-[name]: Fair        Using "furniture walking                      ADL Overall ADL's : Needs assistance/impaired     Grooming: Set up;Standing   Upper Body Bathing: Set up;Standing   Lower Body Bathing: Minimal assistance;Sit to/from stand   Upper Body Dressing : Set up   Lower Body Dressing: Moderate assistance   Toilet Transfer: Minimal assistance;Ambulation;BSC    Toileting- Clothing Manipulation and Hygiene: Minimal assistance;Sit to/from stand       Functional mobility during ADLs: Minimal assistance General ADL Comments: At baseline, pt had difficulty with LB ADL and pericare due to morbid obesity. Discussed availability of AE to assist with ADL.   Completed grooming task and toilet transfer on RA with O2 Sats above 90     Vision     Perception     Praxis      Pertinent Vitals/Pain Pain Assessment: No/denies pain  Vitals stable throughout session. O2 desat to 91 RA. Increased to 96 with pursed lip breathing in less than 30 seconds     Hand Dominance     Extremity/Trunk Assessment Upper Extremity Assessment Upper Extremity Assessment: Generalized weakness   Lower Extremity Assessment Lower Extremity Assessment: Defer to PT evaluation   Cervical / Trunk Assessment Cervical / Trunk Assessment: Normal   Communication Communication Communication: No difficulties   Cognition Arousal/Alertness: Awake/alert Behavior During Therapy: WFL for tasks assessed/performed Overall Cognitive Status: Within Functional Limits for tasks assessed                     General Comments   Pt very motivated to return to PLOF    Exercises Exercises: Other exercises Other Exercises Other Exercises: BUE AROM shoulder FF/ AB/ADD Other Exercises: elbow flex/ext   Shoulder Instructions  Home Living Family/patient expects to be discharged to:: Private residence Living Arrangements: Spouse/significant other Available Help at Discharge: Family;Available PRN/intermittently Type of Home: House Home Access: Stairs to enter [employer] of Steps: 4 Entrance Stairs-Rails: None Home Layout: Able to live on main level with bedroom/bathroom     Bathroom Shower/Tub: Occupational psychologist: Standard Bathroom Accessibility: Yes How Accessible: Accessible via walker Home Equipment: [place] - single point;Transport  chair;Walker - 2 wheels          Prior Functioning/Environment Level of Independence: Independent        Comments: has a cane; reports she doesn't use it; was driving up until about 6 months ago; sleeps in recliner; has caregiver 2 days/week to assist with ADL and IADL.     OT Diagnosis: Generalized weakness   OT Problem List: Decreased strength;Decreased activity tolerance;Decreased knowledge of use of DME or AE;Cardiopulmonary status limiting activity;Obesity   OT Treatment/Interventions: Self-care/ADL training;Therapeutic exercise;Energy conservation;DME and/or AE instruction;Therapeutic activities;Patient/family education    OT Goals(Current goals can be found in the care plan section) Acute Rehab OT Goals Patient Stated Goal: to get better and stronger OT Goal Formulation: With patient Time For Goal Achievement: 10/14/14 Potential to Achieve Goals: Good  OT Frequency: Min 2X/week   Barriers to D/C:            Co-evaluation              End of Session Equipment Utilized During Treatment: Gait belt Nurse Communication: Mobility status  Activity Tolerance: Patient tolerated treatment well Patient left: in chair;with call bell/phone within reach;with family/visitor present   Time: 3570-1779 OT Time Calculation (min): 25 min Charges:  OT General Charges $OT Visit: 1 Procedure OT Evaluation $Initial OT Evaluation Tier I: 1 Procedure OT Treatments $Self Care/Home Management : 8-22 mins G-Codes:    Alexandria [name],HILLARY 26-Oct-2014, 4:58 PM   [facility], OTR/L  [phone] October 26, 2014

## 2014-09-30 NOTE — Progress Notes (Signed)
Patient up in chair. Offered to put her back into bed prior to shift change. She stated that she would rather stay in the chair a while longer until she had to use the bathroom and then get into bed.

## 2014-09-30 NOTE — Telephone Encounter (Signed)
Noted  

## 2014-09-30 NOTE — Clinical Social Work Note (Signed)
Clinical Social Work Assessment  Patient Details  Name: Alexandria [name] MRN: [id number] Date of Birth: [birth year]  Date of referral:  09/30/14               Reason for consult:  Facility Placement                Permission sought to share information with:  Family Supports, Customer service manager Permission granted to share information::  Yes, Release of Information Signed  Name::     Alexandria [name]  Agency::  [facility]  Relationship::  spouse  Contact Information:     Housing/Transportation Living arrangements for the past 2 months:  Single Family Home Source of Information:  Patient Patient Interpreter Needed:  None Criminal Activity/Legal Involvement Pertinent to Current Situation/Hospitalization:  No - Comment as needed Significant Relationships:  Spouse, Siblings Lives with:  Spouse Do you feel safe going back to the place where you live?  Yes Need for family participation in patient care:  Yes (Comment)  Care giving concerns:  Pt lives at home with husband but her husband is at work for most of the day- pt does report having a housekeeper that comes regularly but does not have any source of consistent physical support at home   Facilities manager / plan:  CSW spoke with pt about SNF placement for short term rehab  Employment status:  Retired Advertising copywriter, [employer] PT Recommendations:  [place] / Referral to community resources:  [place]  Patient/Family's Response to care:  Pt is agreeable to SNF and had already [facility] as her first choice for a rehab facility (states it is less than a mile from her home)  Patient/Family's Understanding of and Emotional Response to Diagnosis, Current Treatment, and Prognosis:  Pt was very excited to be feeling better today than yesterday- is hopeful that she can make a full recovery in one week and be able to return back home  Emotional  Assessment Appearance:  Appears stated age Attitude/Demeanor/Rapport:  Lethargic Affect (typically observed):  Appropriate Orientation:  Oriented to Situation, Oriented to  Time, Oriented to Place, Oriented to Self Alcohol / Substance use:  Not Applicable Psych involvement (Current and /or in the community):  No (Comment)  Discharge Needs  Concerns to be addressed:  Care Coordination Readmission within the last 30 days:  No Current discharge risk:  Physical Impairment Barriers to Discharge:  Continued Medical Work up   [employer], LCSW 09/30/2014, 12:24 PM

## 2014-09-30 NOTE — Telephone Encounter (Signed)
Please let her daughter know that I will be working in [employer], and will camera in to Ms. Lamping's room when I am there.

## 2014-09-30 NOTE — Telephone Encounter (Signed)
Patient has been admitted to [facility] for Sepsis Patient is in room [place] to Dr. Halford Chessman

## 2014-10-01 DIAGNOSIS — R7881 Bacteremia: Secondary | ICD-10-CM

## 2014-10-01 LAB — CBC WITH DIFFERENTIAL/PLATELET
Basophils Absolute: 0 10*3/uL (ref 0.0–0.1)
Basophils Relative: 0 % (ref 0–1)
Eosinophils Absolute: 0 10*3/uL (ref 0.0–0.7)
Eosinophils Relative: 0 % (ref 0–5)
HCT: 29.7 % — ABNORMAL LOW (ref 36.0–46.0)
Hemoglobin: 8.9 g/dL — ABNORMAL LOW (ref 12.0–15.0)
Lymphocytes Relative: 7 % — ABNORMAL LOW (ref 12–46)
Lymphs Abs: 0.5 10*3/uL — ABNORMAL LOW (ref 0.7–4.0)
MCH: 23.1 pg — ABNORMAL LOW (ref 26.0–34.0)
MCHC: 30 g/dL (ref 30.0–36.0)
MCV: 77.1 fL — ABNORMAL LOW (ref 78.0–100.0)
Monocytes Absolute: 0.4 10*3/uL (ref 0.1–1.0)
Monocytes Relative: 5 % (ref 3–12)
Neutro Abs: 6.1 10*3/uL (ref 1.7–7.7)
Neutrophils Relative %: 88 % — ABNORMAL HIGH (ref 43–77)
Platelets: 205 10*3/uL (ref 150–400)
RBC: 3.85 MIL/uL — ABNORMAL LOW (ref 3.87–5.11)
RDW: 17.8 % — ABNORMAL HIGH (ref 11.5–15.5)
WBC: 6.9 10*3/uL (ref 4.0–10.5)

## 2014-10-01 LAB — GLUCOSE, CAPILLARY
Glucose-Capillary: 148 mg/dL — ABNORMAL HIGH (ref 65–99)
Glucose-Capillary: 142 mg/dL — ABNORMAL HIGH (ref 65–99)
Glucose-Capillary: 208 mg/dL — ABNORMAL HIGH (ref 65–99)
Glucose-Capillary: 194 mg/dL — ABNORMAL HIGH (ref 65–99)
Glucose-Capillary: 155 mg/dL — ABNORMAL HIGH (ref 65–99)

## 2014-10-01 LAB — BASIC METABOLIC PANEL
Anion gap: 6 (ref 5–15)
BUN: 26 mg/dL — ABNORMAL HIGH (ref 6–20)
CO2: 23 mmol/L (ref 22–32)
Calcium: 8.5 mg/dL — ABNORMAL LOW (ref 8.9–10.3)
Chloride: 109 mmol/L (ref 101–111)
Creatinine, Ser: 1.2 mg/dL — ABNORMAL HIGH (ref 0.44–1.00)
GFR calc Af Amer: 49 mL/min — ABNORMAL LOW (ref >60)
GFR calc non Af Amer: 42 mL/min — ABNORMAL LOW (ref >60)
Glucose, Bld: 166 mg/dL — ABNORMAL HIGH (ref 65–99)
Potassium: 4.5 mmol/L (ref 3.5–5.1)
Sodium: 138 mmol/L (ref 135–145)

## 2014-10-01 LAB — MAGNESIUM: Magnesium: 2.3 mg/dL (ref 1.7–2.4)

## 2014-10-01 LAB — OCCULT BLOOD X 1 CARD TO LAB, STOOL: Fecal Occult Bld: NEGATIVE

## 2014-10-01 LAB — HUMAN PARVOVIRUS DNA DETECTION BY PCR: Parvovirus B19, PCR: NEGATIVE

## 2014-10-01 LAB — LEGIONELLA ANTIGEN, URINE

## 2014-10-01 NOTE — Progress Notes (Signed)
CSW faxed updated clinicals to [facility] still pending.  CSW will continue to follow.  Domenica Reamer, [place] Social Worker [phone]

## 2014-10-01 NOTE — Progress Notes (Signed)
Spoke with daughter and Dr. Sherral Hammers. He will contact daughter per her request on her cell phone.

## 2014-10-01 NOTE — Progress Notes (Signed)
[place] TEAM 1 - Stepdown/ICU TEAM Progress Note  Alexandria [name] MPN:[id number] DOB: 1935/10/30 DOA: 09/27/2014 PCP: Elsie Stain, MD  Admit HPI / Brief Narrative: 79 year old WF PMHx Depression, HTN, HLD, COPD, OSA, GERD, Diabetes Type 2, Peripheral Neuropathy,   Came to the [facility] emergency department on 09/27/2014 from her primary care physician's office. She been experiencing a fever to 102, body aches, and generalized weakness for 1-2 days. She also noted some burning on urination. She been trying to eat and drink normally and she said her appetite had been well. She did not have diarrhea, nausea, vomiting, or chest pain. She had been coughing for about 1 week prior to admission. Notably, one week ago she was seen in emergency department for similar symptoms (low blood pressure, fatigue) [place] while [place]. She was treated briefly with fluids and was discharged. She said that none of her medications a change recently and she continues to take her losartan. She does not take any diuretics. She denies sick contacts. She denies rash.  HPI/Subjective: 7/26 A/O 4, NAD,. States feels breathing is significantly improved. Sitting comfortably in chair eating her lunch. States Dr. Halford Chessman [facility] M) have previously arranged for patient to have auto titrating CPAP at home, and therefore should have her current new settings.    Assessment/Plan: COPD Exacerbation    -Most Likely Viral.  -Ambulatory SPO2; shows patient qualifies for home O2 see ambulatory SPO2 below.  -Monitor O2 saturation titrate to maintain SPO2 89 and 93% -Continue home Symbicort 160-4.5 BID -Continue Prednisone 75 mg daily -Continue Xopenex QID -Respiratory virus panel, Legionella urine antigen,   OSA/Obesity Hypoventilation Syndrome -CPAP per respiratory  Chronic Diastolic CHF -Echo 00/10/6759; LVEF= 95-09%, grade 1 diastolic dysfunction -Repeat echocardiogram; showed LVH/mildly dilated left atrium see  results below -Transfuse for hemoglobin<8 -7/23 Transfuse 2 units PRBC  Hypotension uncertain etiology:  -Most likely multifactorial to include anemia, OHS, OSA, UTI  -Resolved  Acute renal failure due to hypovolemia (baseline Cr~1.07) -Improving,Cr  currently 1.59 -Continue normal saline at 50 ml/hr   Urinary tract infection/GNR Bacteremia vs contaminant? -Urine culture insignificant growth - complete 5 day course ABX  Escherichia coli bacteremia -Patient will require 2 weeks of antibiotics -Will obtain repeat blood cultures in a.m.  Continue home PPI and H2 blocker -Regular diet -Protonix 40 mg BID -Famotidine 20 mg QHS  Microcytic anemia  -7/23 Transfuse 2 units PRBC -Transfusion for hemoglobin< 8 -Anemia panel; c/w Fe deficiency anemia -Haptoglobin low -LDH normal -Parvo B19 pending  DiabetesType 2 controlled -7/24 hemoglobin A1c= 6.1 -Lipid panel within ADA guidelines -Continue Resistant SSI -Continue Lantus 16 units Daily  Hypothyroidism -Continue Synthroid 150 g daily -TSH pending  Anxiety -Increase Prozac 30 mg BID -D/C  Xanax to 0.5 BID -Continue Ativan 0.5 mg QAm  Psychosocial -Today as I was dictating a note on another patient Alexandria [name] (daughter) of patient walked into the work area on [address] and requested that I stopped working on the current patient and speak with her concerning her mother. RN [name] and myself informed her that I was working on an outpatient however I would be glad to speak with her by phone if she could not stay. Patient contacted Ms. Autumn [facility], and stated she wanted to speak with me prior to 1700 and could not understand why I could not stop seeing patients to speak with her. I attempted to contact In additionTanya (daughter) at [phone] and was directed to her voicemail where I left a  message letting daughter know that her mother was doing fine, and if there was any additional questions feel free to contact us.  The exact time of the message was 1656, prior to daughters requested Deadline of 1700.    Code Status: FULL Family Communication: Spoke with Alexandria [name] (daughter) at [phone] at length over the phone Disposition Plan: Next 24-48 hours to SNF     Consultants: Dr.Clinton D Young (PCCM)   Procedure/Significant Events: 7/23 Echocardiogram;- Left ventricle: mild LVH. -LVEF= 55% to 60%.-Regional wall motion abnormalities cannot be excluded. - Left atrium: mildly dilated. 7/24 ambulatory SPO2; SATURATION QUALIFICATIONS: (This note is used to comply with regulatory documentation for home oxygen) Patient Saturations on Room Air at Rest = 92% Patient Saturations on Room Air while Ambulating = 85% Patient Saturations on 2 Liters of oxygen while Ambulating = 97%      Culture 7/22 blood left hand NGTD; 7/22 blood positive in [facility] ESCHERICHIA COLI 7/22 urine insignificant 7/24 influenza A/B/H1N1 negative 7/25 strep pneumo urine antigen negative  Antibiotics:  Zosyn 1 dose in ED Ceftriaxone 7/22>> stopped 7/25 Ceftin 7/25>> Vancomycin  >>> stopped 7/25  DVT prophylaxis: SCD   Devices    LINES / TUBES:      Continuous Infusions: . sodium chloride 50 mL/hr at 09/29/14 2159    Objective: VITAL SIGNS: Temp: 98.5 F (36.9 C) (07/27 0456) Temp Source: Oral (07/27 0456) BP: 142/60 mmHg (07/27 0456) Pulse Rate: 66 (07/27 0456) SPO2; FIO2:   Intake/Output Summary (Last 24 hours) at 10/02/14 0654 Last data filed at 10/01/14 1300  Gross per 24 hour  Intake    480 ml  Output    600 ml  Net   -120 ml     Exam: General:A/O 4, NAD,. Patient with decreased WOB compared to previous days, no Acute respiratory distress Eyes: Negative headache, eye pain, double vision, negative scleral hemorrhage ENT: Negative Runny nose, negative ear pain, negative tinnitus, negative gingival bleeding Neck:  Negative scars, masses, torticollis, lymphadenopathy, JVD Lungs: diffuse  mild wheezing, negative crackles (significantly improved)  Cardiovascular: Regular rate and rhythm without murmur gallop or rub normal S1 and S2 Abdomen:negative abdominal pain, negative dysphagia, Nontender, nondistended, soft, bowel sounds positive, no rebound, no ascites, no appreciable mass Extremities: No significant cyanosis, clubbing, or edema bilateral lower extremities Psychiatric:  Negative depression, negative anxiety, negative fatigue, negative mania  Neurologic:  Cranial nerves II through XII intact, tongue/uvula midline, all extremities muscle strength 5/5, sensation intact throughout, negative dysarthria, negative expressive aphasia, negative receptive aphasia.      Data Reviewed: Basic Metabolic Panel:  Recent Labs Lab 09/27/14 1408 09/27/14 1946 09/28/14 0930 09/29/14 0331 10/01/14 0326  NA 136  --  139 138 138  K 3.3*  --  4.0 4.4 4.5  CL 106  --  111 109 109  CO2 26  --  23 21* 23  GLUCOSE 205*  --  128* 140* 166*  BUN 24*  --  19 15 26*  CREATININE 2.33*  --  1.59* 1.27* 1.20*  CALCIUM 7.8*  --  7.3* 7.7* 8.5*  MG  --  1.9 1.9  --  2.3  PHOS  --  2.6  --   --   --    Liver Function Tests:  Recent Labs Lab 09/27/14 1408 09/28/14 0930  AST 18 20  ALT 13* 13*  ALKPHOS 72 67  BILITOT 0.6 0.4  PROT 5.7* 5.5*  ALBUMIN 2.8* 2.3*   No results for input(s): LIPASE, AMYLASE  in the last 168 hours. No results for input(s): AMMONIA in the last 168 hours. CBC:  Recent Labs Lab 09/27/14 1408 09/28/14 0930 09/29/14 0331 10/01/14 0326  WBC 19.2* 12.4* 11.6* 6.9  NEUTROABS 17.3* 10.2* 8.8* 6.1  HGB 7.6* 7.0* 9.0* 8.9*  HCT 26.6* 25.3* 30.5* 29.7*  MCV 77.8* 77.8* 78.4 77.1*  PLT 233 192 212 205   Cardiac Enzymes: No results for input(s): CKTOTAL, CKMB, CKMBINDEX, TROPONINI in the last 168 hours. BNP (last 3 results) No results for input(s): BNP in the last 8760 hours.  ProBNP (last 3 results) No results for input(s): PROBNP in the last 8760  hours.  CBG:  Recent Labs Lab 10/01/14 1253 10/01/14 1647 10/01/14 2107 10/02/14 0044 10/02/14 0449  GLUCAP 208* 194* 155* 131* 110*    Recent Results (from the past 240 hour(s))  Blood Culture (routine x 2)     Status: None (Preliminary result)   Collection Time: 09/27/14  1:57 PM  Result Value Ref Range Status   Specimen Description BLOOD LEFT HAND  Final   Special Requests BOTTLES DRAWN AEROBIC AND ANAEROBIC 3CC  Final   Culture NO GROWTH 4 DAYS  Final   Report Status PENDING  Incomplete  Blood Culture (routine x 2)     Status: None (Preliminary result)   Collection Time: 09/27/14  2:00 PM  Result Value Ref Range Status   Specimen Description BLOOD RIGHT ANTECUBITAL  Final   Special Requests BOTTLES DRAWN AEROBIC AND ANAEROBIC 10ML  Final   Culture  Setup Time   Final    GRAM NEGATIVE RODS AEROBIC BOTTLE ONLY CRITICAL RESULT CALLED TO, READ BACK BY AND VERIFIED WITH: Melody Comas RN [id number] 09/29/14 A BROWNING    Culture ESCHERICHIA COLI  Final   Report Status PENDING  Incomplete   Organism ID, Bacteria ESCHERICHIA COLI  Final      Susceptibility   Escherichia coli - MIC*    AMPICILLIN <=2 SENSITIVE Sensitive     CEFAZOLIN <=4 SENSITIVE Sensitive     CEFEPIME <=1 SENSITIVE Sensitive     CEFTAZIDIME <=1 SENSITIVE Sensitive     CEFTRIAXONE <=1 SENSITIVE Sensitive     CIPROFLOXACIN <=0.25 SENSITIVE Sensitive     GENTAMICIN <=1 SENSITIVE Sensitive     IMIPENEM <=0.25 SENSITIVE Sensitive     TRIMETH/SULFA <=20 SENSITIVE Sensitive     AMPICILLIN/SULBACTAM <=2 SENSITIVE Sensitive     PIP/TAZO <=4 SENSITIVE Sensitive     * ESCHERICHIA COLI  Urine culture     Status: None   Collection Time: 09/27/14  2:26 PM  Result Value Ref Range Status   Specimen Description URINE, CLEAN CATCH  Final   Special Requests NONE  Final   Culture 5,000 COLONIES/mL INSIGNIFICANT GROWTH  Final   Report Status 09/28/2014 FINAL  Final  MRSA PCR Screening     Status: None   Collection Time: 09/27/14   6:27 PM  Result Value Ref Range Status   MRSA by PCR NEGATIVE NEGATIVE Final    Comment:        The GeneXpert MRSA Assay (FDA approved for NASAL specimens only), is one component of a comprehensive MRSA colonization surveillance program. It is not intended to diagnose MRSA infection nor to guide or monitor treatment for MRSA infections.   Culture, expectorated sputum-assessment     Status: None   Collection Time: 09/30/14 10:30 AM  Result Value Ref Range Status   Specimen Description EXPECTORATED SPUTUM  Final   Special Requests NONE  Final  Sputum evaluation   Final    MICROSCOPIC FINDINGS SUGGEST THAT THIS SPECIMEN IS NOT REPRESENTATIVE OF LOWER RESPIRATORY SECRETIONS. PLEASE RECOLLECT. Results Called to: G WHITE 09/30/14 @ 66 M VESTAL    Report Status 09/30/2014 FINAL  Final  Culture, blood (routine x 2)     Status: None (Preliminary result)   Collection Time: 10/02/14  5:44 AM  Result Value Ref Range Status   Specimen Description BLOOD LEFT ANTECUBITAL  Final   Special Requests IN PEDIATRIC BOTTLE 3CC  Final   Culture PENDING  Incomplete   Report Status PENDING  Incomplete  Culture, blood (routine x 2)     Status: None (Preliminary result)   Collection Time: 10/02/14  5:56 AM  Result Value Ref Range Status   Specimen Description BLOOD RIGHT ANTECUBITAL  Final   Special Requests BOTTLES DRAWN AEROBIC AND ANAEROBIC 5CC EA  Final   Culture PENDING  Incomplete   Report Status PENDING  Incomplete     Studies:  Recent x-ray studies have been reviewed in detail by the Attending Physician  Scheduled Meds:  Scheduled Meds: . sodium chloride   Intravenous Once  . sodium chloride   Intravenous Once  . budesonide-formoterol  2 puff Inhalation BID  . cefUROXime  500 mg Oral BID WC  . dextromethorphan-guaiFENesin  1 tablet Oral BID  . famotidine  20 mg Oral QHS  . FLUoxetine  30 mg Oral BID  . insulin aspart  0-20 Units Subcutaneous 6 times per day  . insulin aspart   0-5 Units Subcutaneous QHS  . insulin glargine  16 Units Subcutaneous Daily  . irbesartan  75 mg Oral Daily  . levalbuterol  1.25 mg Nebulization 4 times per day  . levothyroxine  150 mcg Oral QAC breakfast  . LORazepam  0.5 mg Oral q morning - 10a  . pantoprazole  40 mg Oral BID  . predniSONE  75 mg Oral Q breakfast  . rosuvastatin  10 mg Oral QHS    Time spent on care of this patient: 40 mins   Bryssa Tones, Geraldo Docker , MD  [facility] Office  [phone] Pager - [phone]  On-Call/Text Page:      [email or web address]      password [email or web address]  If 7PM-7AM, please contact night-coverage [email or web address] Password [email or web address] 10/02/2014, 6:54 AM   LOS: 5 days   Care during the described time interval was provided by me .  I have reviewed this patient's available data, including medical history, events of note, physical examination, and all test results as part of my evaluation. I have personally reviewed and interpreted all radiology studies.   Dia Crawford, MD [phone] Pager

## 2014-10-01 NOTE — Progress Notes (Signed)
Physical Therapy Treatment Patient Details Name: Alexandria [name] MRN: [id number] DOB: 12/18/1935 Today's Date: 10/01/2014    History of Present Illness This is a pleasant 79 year old female with a past medical history significant for hypertension, hypothyroidism, and diabetes 2 came to the [facility] emergency department on 09/27/2014 from her primary care physician's office. Pt with UTI and COPD exacerbation    PT Comments    Pt with good activity progression and able to tolerate RA when at rest with sats 98%. With combing hair, rising to standing or any movement pt sats dropped to 88%. Pt required 4L to maintain sats 90% and > today with ambulation. Pt performing HEP throughout the day and encouraged to continue along with pursed lip breathing.   Follow Up Recommendations  SNF;Supervision for mobility/OOB     Equipment Recommendations  Rolling walker with 5" wheels (rollator)    Recommendations for Other Services       Precautions / Restrictions Precautions Precautions: Fall Precaution Comments: watch O2 sats    Mobility  Bed Mobility               General bed mobility comments: pt up in chair  Transfers Overall transfer level: Modified independent                  Ambulation/Gait Ambulation/Gait assistance: Supervision Ambulation Distance (Feet): 225 Feet Assistive device: Rolling walker (2 wheeled) Gait Pattern/deviations: Step-through pattern;Decreased stride length;Trunk flexed   Gait velocity interpretation: Below normal speed for age/gender General Gait Details: cues for position in RW, breathing technique and frequent cues for self monitoring and rests in standing due to desaturation.    Stairs            Wheelchair Mobility    Modified Rankin (Stroke Patients Only)       Balance Overall balance assessment: Needs assistance   Sitting balance-[name]: Good       Standing balance-[name]: Fair                       Cognition Arousal/Alertness: Awake/alert Behavior During Therapy: WFL for tasks assessed/performed Overall Cognitive Status: Within Functional Limits for tasks assessed                      Exercises General Exercises - Lower Extremity Long Arc Quad: AROM;Seated;Both;20 reps Hip Flexion/Marching: AROM;Seated;Both;20 reps    General Comments        Pertinent Vitals/Pain Pain Assessment: No/denies pain  sats 99% on RA at rest 90-96% on 4L with gait Dropping to 85% with limited distance grossly 20' with anything less than 4L HR 103 with gait    Home Living                      Prior Function            PT Goals (current goals can now be found in the care plan section) Progress towards PT goals: Progressing toward goals    Frequency       PT Plan Current plan remains appropriate    Co-evaluation             End of Session Equipment Utilized During Treatment: Oxygen Activity Tolerance: Patient tolerated treatment well Patient left: in chair;with call bell/phone within reach;with family/visitor present     Time: 4098-1191 PT Time Calculation (min) (ACUTE ONLY): 26 min  Charges:  $Gait Training: 8-22 mins $Therapeutic Activity: 8-22 mins  G CodesMelford Aase 10/26/14, 12:16 PM Elwyn Reach, [place]

## 2014-10-01 NOTE — Progress Notes (Signed)
Received a call from Dr. Sherral Hammers : Dr. Sherral Hammers states that he called the patient's daughter , Lavella Lemons, at 77. It was agreed upon that he would contact her prior to 5 pm. He states there was no answer and that he left a message letting her know that the patient was doing well and that she could call him back if she needed to speak to him.

## 2014-10-01 NOTE — Progress Notes (Addendum)
RT note-Spoke with patient regarding her cpap machine this morning. She did not wear last night but on 2l.min nasal canula. Family at bedside with questions concerning when she should use machine. Patient at that time was up in the chair and basically said she could wear it anytime she felt she needed it or was napping and wanted to wear. Since then Dr. Sherral Hammers to see patient and indicated that Dr. Halford Chessman was also seeing her for her sleep apnea and new result on a higher PS was needed. We are unable to change settings with her home equipment and conveyed that to Dr. Redmond Pulling. A hospital CPAP machine was brought to the room and placed on patient for her afternoon nap. She did not tolerate our auto machine which was registering at 9.0 cmh2o. Patient currently is on her own machine at 5.0cmh20 with no o2 with sp02 94%. Will continue to monitor.

## 2014-10-02 ENCOUNTER — Telehealth: Payer: Self-pay | Admitting: Pulmonary Disease

## 2014-10-02 DIAGNOSIS — I5033 Acute on chronic diastolic (congestive) heart failure: Secondary | ICD-10-CM | POA: Diagnosis present

## 2014-10-02 DIAGNOSIS — R7881 Bacteremia: Secondary | ICD-10-CM | POA: Diagnosis present

## 2014-10-02 DIAGNOSIS — N390 Urinary tract infection, site not specified: Secondary | ICD-10-CM

## 2014-10-02 DIAGNOSIS — I5032 Chronic diastolic (congestive) heart failure: Secondary | ICD-10-CM | POA: Diagnosis present

## 2014-10-02 DIAGNOSIS — B9620 Unspecified Escherichia coli [E. coli] as the cause of diseases classified elsewhere: Secondary | ICD-10-CM | POA: Diagnosis present

## 2014-10-02 DIAGNOSIS — G4733 Obstructive sleep apnea (adult) (pediatric): Secondary | ICD-10-CM

## 2014-10-02 DIAGNOSIS — D509 Iron deficiency anemia, unspecified: Secondary | ICD-10-CM | POA: Diagnosis present

## 2014-10-02 LAB — GLUCOSE, CAPILLARY
Glucose-Capillary: 110 mg/dL — ABNORMAL HIGH (ref 65–99)
Glucose-Capillary: 131 mg/dL — ABNORMAL HIGH (ref 65–99)
Glucose-Capillary: 135 mg/dL — ABNORMAL HIGH (ref 65–99)
Glucose-Capillary: 92 mg/dL (ref 65–99)

## 2014-10-02 LAB — CULTURE, BLOOD (ROUTINE X 2): Culture: NO GROWTH

## 2014-10-02 LAB — BASIC METABOLIC PANEL
Anion gap: 5 (ref 5–15)
BUN: 30 mg/dL — ABNORMAL HIGH (ref 6–20)
CO2: 24 mmol/L (ref 22–32)
Calcium: 8.5 mg/dL — ABNORMAL LOW (ref 8.9–10.3)
Chloride: 110 mmol/L (ref 101–111)
Creatinine, Ser: 1.22 mg/dL — ABNORMAL HIGH (ref 0.44–1.00)
GFR calc Af Amer: 48 mL/min — ABNORMAL LOW (ref >60)
GFR calc non Af Amer: 42 mL/min — ABNORMAL LOW (ref >60)
Glucose, Bld: 95 mg/dL (ref 65–99)
Potassium: 4.3 mmol/L (ref 3.5–5.1)
Sodium: 139 mmol/L (ref 135–145)

## 2014-10-02 LAB — MAGNESIUM: Magnesium: 2.3 mg/dL (ref 1.7–2.4)

## 2014-10-02 MED ORDER — LORAZEPAM 0.5 MG PO TABS: 0.5000 mg | ORAL_TABLET | Freq: Every morning | ORAL | Status: DC

## 2014-10-02 MED ORDER — IRBESARTAN 75 MG PO TABS: 75.0000 mg | ORAL_TABLET | Freq: Every day | ORAL | Status: DC

## 2014-10-02 MED ORDER — FERROUS SULFATE 325 (65 FE) MG PO TABS
325.0000 mg | ORAL_TABLET | Freq: Three times a day (TID) | ORAL | Status: DC
  Filled 2014-10-02 (×3): qty 1

## 2014-10-02 MED ORDER — FERROUS SULFATE 325 (65 FE) MG PO TABS: 325.0000 mg | ORAL_TABLET | Freq: Three times a day (TID) | ORAL | Status: DC

## 2014-10-02 MED ORDER — VITAMIN C 500 MG PO TABS: 500.0000 mg | ORAL_TABLET | Freq: Two times a day (BID) | ORAL | Status: DC

## 2014-10-02 MED ORDER — CEFUROXIME AXETIL 500 MG PO TABS: 500.0000 mg | ORAL_TABLET | Freq: Two times a day (BID) | ORAL | Status: DC

## 2014-10-02 MED ORDER — FLUOXETINE HCL 10 MG PO CAPS: ORAL_CAPSULE | ORAL | Status: DC

## 2014-10-02 MED ORDER — ASCORBIC ACID 500 MG PO TABS
500.0000 mg | ORAL_TABLET | Freq: Two times a day (BID) | ORAL | Status: DC
Start: 2014-10-02 — End: 2016-02-10

## 2014-10-02 MED ORDER — VITAMIN C 500 MG PO TABS
500.0000 mg | ORAL_TABLET | Freq: Two times a day (BID) | ORAL | Status: DC
  Filled 2014-10-02 (×2): qty 1

## 2014-10-02 NOTE — Progress Notes (Signed)
Occupational Therapy Treatment Patient Details Name: COURNEY GARROD MRN: [id number] DOB: September 19, 1935 Today's Date: 10/02/2014    History of present illness This is a pleasant 79 year old female with a past medical history significant for hypertension, hypothyroidism, and diabetes 2 came to the [facility] emergency department on 09/27/2014 from her primary care physician's office. Pt with UTI and COPD exacerbation   OT comments  Pt educated in use of AE for LB bathing and dressing and in energy conservation. Practiced pursed lip breathing techniques with exertion.  Sats remained 93-100% on 2L.  Follow Up Recommendations  SNF;Supervision/Assistance - 24 hour    Equipment Recommendations       Recommendations for Other Services      Precautions / Restrictions Precautions Precautions: Fall Precaution Comments: watch O2 sats Restrictions Weight Bearing Restrictions: No       Mobility Bed Mobility               General bed mobility comments: pt up in chair  Transfers Overall transfer level: Modified independent Equipment used: Rolling walker (2 wheeled)                  Balance                                   ADL Overall ADL's : Needs assistance/impaired                         Toilet Transfer: Supervision/safety;Ambulation;BSC   Toileting- Clothing Manipulation and Hygiene: Minimal assistance;Sit to/from stand       Functional mobility during ADLs: Supervision/safety;Rolling walker General ADL Comments: Educated pt in use of AE for LB ADL. Provided pt with energy conservation handout and reviewed. Instructed in pursed lip breathing techniques.      Vision                     Perception     Praxis      Cognition   Behavior During Therapy: WFL for tasks assessed/performed Overall Cognitive Status: Within Functional Limits for tasks assessed                       Extremity/Trunk Assessment                Exercises     Shoulder Instructions       General Comments      Pertinent Vitals/ Pain       Pain Assessment: No/denies pain  Home Living                                          Prior Functioning/Environment              Frequency Min 2X/week     Progress Toward Goals  OT Goals(current goals can now be found in the care plan section)  Progress towards OT goals: Progressing toward goals  Acute Rehab OT Goals Patient Stated Goal: to get better and stronger  Plan Discharge plan remains appropriate    Co-evaluation                 End of Session     Activity Tolerance Patient tolerated treatment well   Patient Left in chair;with call bell/phone within reach;with family/visitor present  Nurse Communication          Time: 4136-4383 OT Time Calculation (min): 26 min  Charges: OT General Charges $OT Visit: 1 Procedure OT Treatments $Self Care/Home Management : 23-37 mins  Malka So 10/02/2014, 12:41 PM  [phone]

## 2014-10-02 NOTE — Progress Notes (Signed)
Report called to receiving nurse at [facility]. All questions answered. Family notified of discharge. Husband at bedside.

## 2014-10-02 NOTE — Discharge Summary (Signed)
Physician Discharge Summary  FATINA SPRANKLE ZJQ:[id number] DOB: 04-09-1935 DOA: 09/27/2014  PCP: Elsie Stain, MD  Admit date: 09/27/2014 Discharge date: 10/02/2014  Time spent: 40 minutes  Recommendations for Outpatient Follow-up:  COPD Exacerbation   -Most Likely Viral.  -Ambulatory SPO2; shows patient qualifies for home O2 see ambulatory SPO2 below.  -Continue home Symbicort 160-4.5 BID -Xopenex PRN wheezing/SOB  -Follow-up with Dr. Elsie Stain within 2 weeks to monitor anemia, Escherichia coli bacteremia.  OSA/Obesity Hypoventilation Syndrome -CPAP per respiratory; during this hospitalization patient was on auto titrating CPAP which reset her pressure requirement to 80mH2O. patient to request full mask at follow-up with patient will Dr. VChesley Mires Felt she tolerated CPAP better than with the nasal pillows  Chronic Diastolic CHF -Echo 124/0/9735 LVEF= 532-99% grade 1 diastolic dysfunction -Repeat echocardiogram; showed LVH/mildly dilated left atrium see results below -Transfuse for hemoglobin<8 -7/23 Transfuse 2 units PRBC -Patient to request that PCP, endocrinologist, pulmonologist occasionally monitor her H/H  Hypotension uncertain etiology:  -Most likely multifactorial to include anemia, OHS, OSA, UTI  -Resolved  Acute renal failure due to hypovolemia (baseline Cr~1.07) -Cr Improved to 1.22 on day of discharge  Urinary tract infection/GNR Bacteremia vs contaminant? -Urine culture insignificant growth  Escherichia coli bacteremia -Patient will require 10 days of antibiotics -Will obtain repeat blood cultures in a.m.  Microcytic anemia/iron deficiency anemia  -7/23 Transfuse 2 units PRBC -Transfusion for hemoglobin< 8 -Anemia panel; c/w Fe deficiency anemia -Discharge on Fe-sulfate 325 mg TID -Discharge on vitamin C 500 mg BID -Endocrinologist, Pulmonologist, PCP to  occasionally monitor her H/H  DiabetesType 2 controlled -7/24 hemoglobin A1c=  6.1 -Lipid panel within ADA guidelines -Patient and was controlled on diet and Januvia 100 mg daily prior to hospitalization requiring steroid (steroid-induced). Steroid-induced exacerbation should stop upon discharge, should not require insulin.  -PCP to monitor A1c   Hypothyroidism -Continue Synthroid 150 g daily -7/24 TSH= 0.34, counseled patient would not increase her Synthroid while hospitalized but should be addressed with endocrinologist upon discharge   Anxiety -Continue Prozac 30 mg BID -Continue Ativan 0.5 mg QAm    Discharge Diagnoses:  Active Problems:   Hypotension   Sepsis   Anemia, iron deficiency   Other emphysema   OSA on CPAP   Obesity hypoventilation syndrome   Chronic diastolic congestive heart failure   Other specified hypotension   Acute renal failure syndrome   Acute cystitis without hematuria   Absolute anemia   Type 2 diabetes mellitus with complication   Other specified hypothyroidism   Type 2 diabetes mellitus with hyperglycemia   Anxiety state   Urinary tract infectious disease   Microcytic anemia   Diabetes type 2, controlled   Bacteremia, escherichia coli   Chronic diastolic CHF (congestive heart failure)   Escherichia coli urinary tract infection   Iron deficiency anemia   Discharge Condition: Stable  Diet recommendation: Heart healthy/American diabetic Association  Filed Weights   09/30/14 0500 10/01/14 0636 10/02/14 0500  Weight: 110 kg (242 lb 8.1 oz) 111.2 kg (245 lb 2.4 oz) 112.4 kg (247 lb 12.8 oz)    History of present illness:  79year old WF PMHx Depression, HTN, HLD, COPD, OSA, GERD, Diabetes Type 2, Peripheral Neuropathy,   [facility] from her primary care physician's office. She been experiencing a fever to 102, body aches, and generalized weakness for 1-2 days. She also noted some burning on urination. She been trying to eat and drink normally and she said  her appetite had  been well. She did not have diarrhea, nausea, vomiting, or chest pain. She had been coughing for about 1 week prior to admission. Notably, one week ago she was seen in emergency department for similar symptoms (low blood pressure, fatigue) in another state while traveling. She was treated briefly with fluids and was discharged. She said that none of her medications a change recently and she continues to take her losartan. She does not take any diuretics. She denies sick contacts. She denies rash. During his hospitalization patient was found to have acute on chronic hypoxia secondary to multiple reasons; OSA/OHS/severe anemia. Patient received 2 units PRBC and her H/H is stabilized. Patient was also started on iron+ vitamin C prior deficiency. In addition respiratory provided auto titrating CPAP which showed that her pressure requirement had increased to 63mH2O. In addition patient was diagnosed with Escherichia coli bacteremia and placed on appropriate antibiotics.    Consultants: Dr.Clinton D Young (PCCM)   Procedure/Significant Events: 7/23 Echocardiogram;- Left ventricle: mild LVH. -LVEF= 55% to 60%.-Regional wall motion abnormalities cannot be excluded. - Left atrium: mildly dilated. 7/24 ambulatory SPO2; SATURATION QUALIFICATIONS: (This note is used to comply with regulatory documentation for home oxygen) Patient Saturations on Room Air at Rest = 92% Patient Saturations on Room Air while Ambulating = 85% Patient Saturations on 2 Liters of oxygen while Ambulating = 97%      Culture 7/22 blood left hand NGTD; 7/22 blood positive [facility] 7/22 urine insignificant 7/24 influenza A/B/H1N1 negative 7/25 strep pneumo urine antigen negative 7/27 blood Lt/Rt AC NGTD   Antibiotics:  Zosyn 1 dose in ED Ceftriaxone 7/22>> stopped 7/25 Ceftin 7/25>> Vancomycin >>> stopped 7/25     Discharge Exam: Filed Vitals:   10/02/14 0500 10/02/14 0808 10/02/14 0923 10/02/14  1220  BP:  143/64  164/54  Pulse:  68  71  Temp:  98.5 F (36.9 C)  97.7 F (36.5 C)  TempSrc:  Oral  Oral  Resp:  14  20  Height:      Weight: 112.4 kg (247 lb 12.8 oz)     SpO2:  98% 95%     General:A/O 4, NAD,. Patient with decreased WOB compared to previous days, no Acute respiratory distress Eyes: Negative headache, eye pain, double vision, negative scleral hemorrhage ENT: Negative Runny nose, negative ear pain, negative tinnitus, negative gingival bleeding Neck: Negative scars, masses, torticollis, lymphadenopathy, JVD Lungs: diffuse mild wheezing, negative crackles (significantly improved)  Cardiovascular: Regular rate and rhythm without murmur gallop or rub normal S1 and S2 Abdomen:negative abdominal pain, negative dysphagia, Nontender, nondistended, soft, bowel sounds positive, no rebound, no ascites, no appreciable mass    Discharge Instructions     Medication List    STOP taking these medications        ALPRAZolam 0.5 MG tablet  Commonly known as:  XANAX     valsartan 160 MG tablet  Commonly known as:  DIOVAN      TAKE these medications        albuterol 108 (90 BASE) MCG/ACT inhaler  Commonly known as:  PROAIR HFA  Inhale 2 puffs into the lungs every 6 (six) hours as needed for wheezing or shortness of breath.     ascorbic acid 500 MG tablet  Commonly known as:  VITAMIN C  Take 1 tablet (500 mg total) by mouth 2 (two) times daily.     aspirin 81 MG EC tablet  Take 81 mg by mouth daily.  B-12 PO  Take 1 tablet by mouth daily.     budesonide-formoterol 160-4.5 MCG/ACT inhaler  Commonly known as:  SYMBICORT  INHALE 2 PUFFS FIRST THING IN THE MORNING AND 2 PUFFS AGAIN IN THE EVENING ABOUT 12 HOURS LATER     cefUROXime 500 MG tablet  Commonly known as:  CEFTIN  Take 1 tablet (500 mg total) by mouth 2 (two) times daily with a meal.     Coenzyme Q-10 200 MG Caps  Take 1 capsule by mouth daily.     EPIPEN 0.3 mg/0.3 mL Devi  Generic drug:   EPINEPHrine  Inject 0.3 mg into the muscle as directed.     ergocalciferol 50000 UNITS capsule  Commonly known as:  VITAMIN D2  Take 50,000 Units by mouth every Friday.     ezetimibe 10 MG tablet  Commonly known as:  ZETIA  Take 1 tablet (10 mg total) by mouth at bedtime.     famotidine 20 MG tablet  Commonly known as:  PEPCID  Take 20 mg by mouth at bedtime.     ferrous sulfate 325 (65 FE) MG tablet  Take 1 tablet (325 mg total) by mouth 3 (three) times daily with meals.     FLUoxetine 10 MG capsule  Commonly known as:  PROZAC  TAKE 3 CAPSULES IN THE MORNING AND TAKE 3 CAPSULES IN THE EVENING     fluticasone 50 MCG/ACT nasal spray  Commonly known as:  FLONASE  USE 2 SPRAYS INTO THE NOSE EVERY 12 HOURS AS NEEDED FOR STUFFY NOSE     ibuprofen 200 MG tablet  Commonly known as:  ADVIL,MOTRIN  Take 200-400 mg by mouth every 6 (six) hours as needed for moderate pain.     irbesartan 75 MG tablet  Commonly known as:  AVAPRO  Take 1 tablet (75 mg total) by mouth daily.     levothyroxine 150 MCG tablet  Commonly known as:  SYNTHROID, LEVOTHROID  Take 150 mcg by mouth daily before breakfast. Can not take generic. Must be synthroid     LORazepam 0.5 MG tablet  Commonly known as:  ATIVAN  Take 1 tablet (0.5 mg total) by mouth every morning.     NEXIUM 40 MG capsule  Generic drug:  esomeprazole  TAKE 1 CAPSULE DAILY     rosuvastatin 10 MG tablet  Commonly known as:  CRESTOR  Take 1 tablet (10 mg total) by mouth at bedtime.     SALONPAS PAIN RELIEF PATCH EX  Apply 1 patch topically daily as needed (pain).     sitaGLIPtin 100 MG tablet  Commonly known as:  JANUVIA  Take 1 tablet (100 mg total) by mouth daily.       Allergies  Allergen Reactions  . Clarithromycin     REACTION: diff swallowing and mouth blisters  . Codeine Other (See Comments)    Unknown   . Spiriva Handihaler [Tiotropium Bromide Monohydrate] Other (See Comments)    Voice changes  . Sulfonamide  Derivatives     REACTION: closed throat   Follow-up Information    Follow up with Elsie Stain, MD. Schedule an appointment as soon as possible for a visit in 2 weeks.   Specialty:  Family Medicine   Why:  -Follow-up with Dr. Elsie Stain within 2 weeks to monitor anemia, Escherichia coli bacteremia.   Contact information:   [place] [address] [phone]       Follow up with Chesley Mires, MD. Schedule an  appointment as soon as possible for a visit in 2 weeks.   Specialty:  Pulmonary Disease   Why:  Follow-up with Dr. Chesley Mires in 2 weeks acute on chronic respiratory failure, OSA   Contact information:   [address] [address] [phone]        The results of significant diagnostics from this hospitalization (including imaging, microbiology, ancillary and laboratory) are listed below for reference.    Significant Diagnostic Studies: Dg Chest Port 1 View  09/30/2014   CLINICAL DATA:  Increased work of breathing and shortness of breath.  EXAM: PORTABLE CHEST - 1 VIEW  COMPARISON:  09/27/2014  FINDINGS: Normal heart size. There is no pleural effusion identified. Pulmonary vascular congestion noted. There is aortic atherosclerosis. No airspace consolidation.  IMPRESSION: 1. Pulmonary vascular congestion. 2. Aortic atherosclerosis.   Electronically Signed   By: Kerby Moors M.D.   On: 09/30/2014 09:40   Dg Chest Port 1 View  09/27/2014   CLINICAL DATA:  COPD, dizziness, fever.  EXAM: PORTABLE CHEST - 1 VIEW  COMPARISON:  Chest x-ray dated 07/31/2014.  FINDINGS: Heart size is upper normal, unchanged. Overall cardiomediastinal silhouette is stable in size and configuration. Lungs are clear. Incidental note made of a prominent epicardial fat pad at the left lung base. No evidence of pneumonia. No pleural effusion. No pneumothorax. No acute osseous abnormality.  IMPRESSION: No evidence of acute cardiopulmonary abnormality. No evidence of  pneumonia.   Electronically Signed   By: Franki Cabot M.D.   On: 09/27/2014 13:55    Microbiology: Recent Results (from the past 240 hour(s))  Blood Culture (routine x 2)     Status: None (Preliminary result)   Collection Time: 09/27/14  1:57 PM  Result Value Ref Range Status   Specimen Description BLOOD LEFT HAND  Final   Special Requests BOTTLES DRAWN AEROBIC AND ANAEROBIC 3CC  Final   Culture NO GROWTH 4 DAYS  Final   Report Status PENDING  Incomplete  Blood Culture (routine x 2)     Status: None   Collection Time: 09/27/14  2:00 PM  Result Value Ref Range Status   Specimen Description BLOOD RIGHT ANTECUBITAL  Final   Special Requests BOTTLES DRAWN AEROBIC AND ANAEROBIC 10ML  Final   Culture  Setup Time   Final    GRAM NEGATIVE RODS AEROBIC BOTTLE ONLY CRITICAL RESULT CALLED TO, READ BACK BY AND VERIFIED WITH: Melody Comas RN [id number] 09/29/14 A BROWNING    Culture ESCHERICHIA COLI  Final   Report Status 10/02/2014 FINAL  Final   Organism ID, Bacteria ESCHERICHIA COLI  Final      Susceptibility   Escherichia coli - MIC*    AMPICILLIN <=2 SENSITIVE Sensitive     CEFAZOLIN <=4 SENSITIVE Sensitive     CEFEPIME <=1 SENSITIVE Sensitive     CEFTAZIDIME <=1 SENSITIVE Sensitive     CEFTRIAXONE <=1 SENSITIVE Sensitive     CIPROFLOXACIN <=0.25 SENSITIVE Sensitive     GENTAMICIN <=1 SENSITIVE Sensitive     IMIPENEM <=0.25 SENSITIVE Sensitive     TRIMETH/SULFA <=20 SENSITIVE Sensitive     AMPICILLIN/SULBACTAM <=2 SENSITIVE Sensitive     PIP/TAZO <=4 SENSITIVE Sensitive     * ESCHERICHIA COLI  Urine culture     Status: None   Collection Time: 09/27/14  2:26 PM  Result Value Ref Range Status   Specimen Description URINE, CLEAN CATCH  Final   Special Requests NONE  Final   Culture 5,000 COLONIES/mL INSIGNIFICANT  GROWTH  Final   Report Status 09/28/2014 FINAL  Final  MRSA PCR Screening     Status: None   Collection Time: 09/27/14  6:27 PM  Result Value Ref Range Status   MRSA by PCR  NEGATIVE NEGATIVE Final    Comment:        The GeneXpert MRSA Assay (FDA approved for NASAL specimens only), is one component of a comprehensive MRSA colonization surveillance program. It is not intended to diagnose MRSA infection nor to guide or monitor treatment for MRSA infections.   Culture, expectorated sputum-assessment     Status: None   Collection Time: 09/30/14 10:30 AM  Result Value Ref Range Status   Specimen Description EXPECTORATED SPUTUM  Final   Special Requests NONE  Final   Sputum evaluation   Final    MICROSCOPIC FINDINGS SUGGEST THAT THIS SPECIMEN IS NOT REPRESENTATIVE OF LOWER RESPIRATORY SECRETIONS. PLEASE RECOLLECT. Results Called to: G WHITE 09/30/14 @ 1255 M VESTAL    Report Status 09/30/2014 FINAL  Final  Culture, blood (routine x 2)     Status: None (Preliminary result)   Collection Time: 10/02/14  5:44 AM  Result Value Ref Range Status   Specimen Description BLOOD LEFT ANTECUBITAL  Final   Special Requests IN PEDIATRIC BOTTLE 3CC  Final   Culture PENDING  Incomplete   Report Status PENDING  Incomplete  Culture, blood (routine x 2)     Status: None (Preliminary result)   Collection Time: 10/02/14  5:56 AM  Result Value Ref Range Status   Specimen Description BLOOD RIGHT ANTECUBITAL  Final   Special Requests BOTTLES DRAWN AEROBIC AND ANAEROBIC 5CC EA  Final   Culture PENDING  Incomplete   Report Status PENDING  Incomplete     Labs: Basic Metabolic Panel:  Recent Labs Lab 09/27/14 1408 09/27/14 1946 09/28/14 0930 09/29/14 0331 10/01/14 0326 10/02/14 0544  NA 136  --  139 138 138 139  K 3.3*  --  4.0 4.4 4.5 4.3  CL 106  --  111 109 109 110  CO2 26  --  23 21* 23 24  GLUCOSE 205*  --  128* 140* 166* 95  BUN 24*  --  19 15 26* 30*  CREATININE 2.33*  --  1.59* 1.27* 1.20* 1.22*  CALCIUM 7.8*  --  7.3* 7.7* 8.5* 8.5*  MG  --  1.9 1.9  --  2.3 2.3  PHOS  --  2.6  --   --   --   --    Liver Function Tests:  Recent Labs Lab 09/27/14 1408  09/28/14 0930  AST 18 20  ALT 13* 13*  ALKPHOS 72 67  BILITOT 0.6 0.4  PROT 5.7* 5.5*  ALBUMIN 2.8* 2.3*   No results for input(s): LIPASE, AMYLASE in the last 168 hours. No results for input(s): AMMONIA in the last 168 hours. CBC:  Recent Labs Lab 09/27/14 1408 09/28/14 0930 09/29/14 0331 10/01/14 0326  WBC 19.2* 12.4* 11.6* 6.9  NEUTROABS 17.3* 10.2* 8.8* 6.1  HGB 7.6* 7.0* 9.0* 8.9*  HCT 26.6* 25.3* 30.5* 29.7*  MCV 77.8* 77.8* 78.4 77.1*  PLT 233 192 212 205   Cardiac Enzymes: No results for input(s): CKTOTAL, CKMB, CKMBINDEX, TROPONINI in the last 168 hours. BNP: BNP (last 3 results) No results for input(s): BNP in the last 8760 hours.  ProBNP (last 3 results) No results for input(s): PROBNP in the last 8760 hours.  CBG:  Recent Labs Lab 10/01/14 2107 10/02/14 0044 10/02/14  8003 10/02/14 0808 10/02/14 1219  GLUCAP 155* 131* 110* 92 135*       Signed:  Dia Crawford, MD [facility] [phone] pager

## 2014-10-02 NOTE — Progress Notes (Signed)
Two appointments made for patient. The date and times written down and given to patient's husband per patient's request.

## 2014-10-02 NOTE — Telephone Encounter (Signed)
Spoke with pt husband, states that they are needing the patient's CPAP pressure increased.  Pt CPAP is set on 5 and an Auto Titration was done while in the hospital and results showed she needed pressure of 9. Husband states that she had a very hard time tolerating 9 and they request it be set on 7. Please advise Dr Halford Chessman. Thanks.   Results within discharge summary: In addition respiratory provided auto titrating CPAP which showed that her pressure requirement had increased to 72mH2O.

## 2014-10-02 NOTE — Telephone Encounter (Signed)
Please explain to Alexandria [name] that her CPAP needs in hospital when is acutely ill might not be the same as what she needs in a more stable health status.  Please have her DME set her CPAP to auto setting with range 5 to 15 cm H2O.  Have download sent 2 weeks after pressure setting change.

## 2014-10-02 NOTE — Progress Notes (Signed)
Patient will discharge to [facility] Anticipated discharge date:10/02/14 Family notified: pt daughter Transportation by [facility]- called at 1:45pm  Pt will bring home CPAP to be used in the facility.  CSW signing off.  Domenica Reamer, Bureau Social Worker [phone]

## 2014-10-03 ENCOUNTER — Encounter: Payer: Self-pay | Admitting: Internal Medicine

## 2014-10-03 ENCOUNTER — Non-Acute Institutional Stay: Payer: BLUE CROSS/BLUE SHIELD | Admitting: Internal Medicine

## 2014-10-03 ENCOUNTER — Other Ambulatory Visit: Payer: Self-pay | Admitting: Family Medicine

## 2014-10-03 DIAGNOSIS — F418 Other specified anxiety disorders: Secondary | ICD-10-CM

## 2014-10-03 DIAGNOSIS — R5381 Other malaise: Secondary | ICD-10-CM

## 2014-10-03 DIAGNOSIS — I5032 Chronic diastolic (congestive) heart failure: Secondary | ICD-10-CM

## 2014-10-03 DIAGNOSIS — E119 Type 2 diabetes mellitus without complications: Secondary | ICD-10-CM

## 2014-10-03 DIAGNOSIS — N289 Disorder of kidney and ureter, unspecified: Secondary | ICD-10-CM

## 2014-10-03 DIAGNOSIS — J449 Chronic obstructive pulmonary disease, unspecified: Secondary | ICD-10-CM

## 2014-10-03 DIAGNOSIS — E038 Other specified hypothyroidism: Secondary | ICD-10-CM | POA: Diagnosis not present

## 2014-10-03 DIAGNOSIS — R7881 Bacteremia: Secondary | ICD-10-CM | POA: Diagnosis not present

## 2014-10-03 DIAGNOSIS — D509 Iron deficiency anemia, unspecified: Secondary | ICD-10-CM

## 2014-10-03 DIAGNOSIS — G4733 Obstructive sleep apnea (adult) (pediatric): Secondary | ICD-10-CM | POA: Diagnosis not present

## 2014-10-03 DIAGNOSIS — Z9989 Dependence on other enabling machines and devices: Secondary | ICD-10-CM

## 2014-10-03 NOTE — Progress Notes (Signed)
Patient ID: Alexandria [name], female   DOB: 1935/11/06, 79 y.o.   MRN: [id number]     [facility]  PCP: Elsie Stain, MD  Code Status: Full Code   Allergies  Allergen Reactions  . Clarithromycin     REACTION: diff swallowing and mouth blisters  . Codeine Other (See Comments)    Unknown   . Spiriva Handihaler [Tiotropium Bromide Monohydrate] Other (See Comments)    Voice changes  . Sulfonamide Derivatives     REACTION: closed throat    Chief Complaint  Patient presents with  . New Admit To SNF    New Admission      HPI:  79 y.o. patient is here for short term rehabilitation post hospital admission from 09/27/14-10/02/14 with sepsis in setting of e.coli bacteremia, hypovolemia, acute renal failure and copd exacerbation. She had blood loss anemia and required 2 u prbc transfusionShe has PMH of copd, OSA, chf, type 2 DM, hypothyroidism among others. She is seen in her room today. She has worked with therapy and feels somewhat short of breath. She is using cpap at night. Denies any concerns.  Review of Systems:  Constitutional: Negative for fever, chills, diaphoresis.  HENT: Negative for headache, congestion, nasal discharge Eyes: Negative for eye pain, blurred vision, double vision and discharge.  Respiratory: positive for cough, shortness of breath with exertion and wheezing.   Cardiovascular: Negative for chest pain, palpitations, leg swelling.  Gastrointestinal: Negative for heartburn, nausea, vomiting, abdominal pain. Had bowel movement this am. Denies diarrhea or melena Genitourinary: Negative for dysuria, flank pain.  Musculoskeletal: Negative for back pain, falls Skin: Negative for itching, rash.  Neurological: positive for dizziness with sudden change of position and neuropathy in feet. Negative for focal weakness. Psychiatric/Behavioral: Negative for depression. Has history of anxiety   Past Medical History  Diagnosis Date  . Depression 12/07/1998    . Hypothyroidism 03/08/1968  . Hypertension 03/08/1992  . Hyperlipidemia 10/04/2000  . Diabetes mellitus type II 02/05/2006    Dr. Elyse Hsu with endo  . COPD (chronic obstructive pulmonary disease) 03/08/1998    PFTs 12/12/2002 FEV 1 1.42 (64%) ratio 58 with no better after B2 and DLCO75%; PFTs 11/20/09 FEV1 1.50 (73%) ratio 50 no better after B2 with DLCO 62%; Hfa 50% 11/20/2009 >75%, 01/13/10 p coaching  . Ventral hernia   . Esophagitis   . Allergy, unspecified not elsewhere classified   . Disorders of bursae and tendons in shoulder region, unspecified     Rotator cuff syndrome, right  . Obesity     NOS  . Peripheral neuropathy     Likely due to DM per Dr. Erling Cruz  . OSA (obstructive sleep apnea)     PSG 01/27/10 AHI 13, pt does not know CPAP settings  . GERD (gastroesophageal reflux disease)   . Chronic diastolic congestive heart failure    Past Surgical History  Procedure Laterality Date  . Tonsillectomy    . Rotator cuff repair  1984    Right, [place]  . Cesarean section      x2 Breech/ repeat  . Total abdominal hysterectomy  1985    Due to dysmennorhea  . Cholecystectomy  1997  . Thumb release  12/1997    Right  . Carpal tunnel release  12/1997    Right  . Esophagogastroduodenoscopy  12/05/1997    Nml (due to hoarseness)  . Abd u/s  03/19/1999    Nml x2 foci in liver  . Ct abd w &  pelvis wo/w cm      Abd hemangiomas of liver, 1 cm R renal cyst  . Cardiolite persantine  08/24/2000    Nml  . Carotid u/s  08/24/2000    1-39% ICA stenosis  . Dexa  07/03/2003    Nml  . Adenosine myoview  06/02/2007    Nml  . Carotid u/s  06/02/2007    No apprec change   . US echocardiography  06/02/2007  . Hernia repair  01/24/2009    Lap Ventr w/ Lysis of adhesions (Dr. Donne Hazel)  . Knee arthroscopic surgery  years ago    right  . Shoulder open rotator cuff repair  02/08/2012    Procedure: ROTATOR CUFF REPAIR SHOULDER OPEN;  Surgeon: Magnus Sinning, MD;  Location: [facility];   Service: Orthopedics;  Laterality: Left;  Left Shoulder Open Anterior Acrominectomy Rotator Cuff Repair Open Distal Clavicle Resection ,tissue mend graft, and repair of biceps tendon  . Gallbladder surgery     Social History:   reports that she quit smoking about 9 years ago. Her smoking use included Cigarettes. She has a 100 pack-year smoking history. She has never used smokeless tobacco. She reports that she drinks about 1.2 oz of alcohol per week. She reports that she does not use illicit drugs.  Family History  Problem Relation Age of Onset  . Heart disease Mother   . Thyroid disease Mother   . Emphysema Father     One lung  . Breast cancer Sister   . Cystic fibrosis Sister   . Hyperthyroidism Sister   . Esophageal cancer Neg Hx   . Cancer Neg Hx     Head or neck  . Osteoarthritis Brother   . Hyperthyroidism Brother     Medications:   Medication List       This list is accurate as of: 10/03/14  9:27 AM.  Always use your most recent med list.               albuterol 108 (90 BASE) MCG/ACT inhaler  Commonly known as:  PROAIR HFA  Inhale 2 puffs into the lungs every 6 (six) hours as needed for wheezing or shortness of breath.     ascorbic acid 500 MG tablet  Commonly known as:  VITAMIN C  Take 1 tablet (500 mg total) by mouth 2 (two) times daily.     aspirin 81 MG EC tablet  Take 81 mg by mouth daily.     B-12 PO  Take 1 tablet by mouth daily.     budesonide-formoterol 160-4.5 MCG/ACT inhaler  Commonly known as:  SYMBICORT  INHALE 2 PUFFS FIRST THING IN THE MORNING AND 2 PUFFS AGAIN IN THE EVENING ABOUT 12 HOURS LATER     cefUROXime 500 MG tablet  Commonly known as:  CEFTIN  Take 1 tablet (500 mg total) by mouth 2 (two) times daily with a meal.     Coenzyme Q-10 200 MG Caps  Take 1 capsule by mouth daily.     COZAAR 25 MG tablet  Generic drug:  losartan  Take 25 mg by mouth daily.     EPIPEN 0.3 mg/0.3 mL Devi  Generic drug:  EPINEPHrine  Inject 0.3 mg  into the muscle as directed.     ergocalciferol 50000 UNITS capsule  Commonly known as:  VITAMIN D2  Take 50,000 Units by mouth every Friday.     ezetimibe 10 MG tablet  Commonly known as:  ZETIA  Take 1 tablet (  10 mg total) by mouth at bedtime.     famotidine 20 MG tablet  Commonly known as:  PEPCID  Take 20 mg by mouth at bedtime.     ferrous sulfate 325 (65 FE) MG tablet  Take 1 tablet (325 mg total) by mouth 3 (three) times daily with meals.     FLUoxetine 10 MG capsule  Commonly known as:  PROZAC  TAKE 3 CAPSULES IN THE MORNING AND TAKE 3 CAPSULES IN THE EVENING     fluticasone 50 MCG/ACT nasal spray  Commonly known as:  FLONASE  USE 2 SPRAYS INTO THE NOSE EVERY 12 HOURS AS NEEDED FOR STUFFY NOSE     ibuprofen 200 MG tablet  Commonly known as:  ADVIL,MOTRIN  Take 200-400 mg by mouth every 6 (six) hours as needed for moderate pain.     levothyroxine 150 MCG tablet  Commonly known as:  SYNTHROID, LEVOTHROID  Take 150 mcg by mouth daily before breakfast. Can not take generic. Must be synthroid     LORazepam 0.5 MG tablet  Commonly known as:  ATIVAN  Take 1 tablet (0.5 mg total) by mouth 2 (two) times daily as needed.         Physical Exam: Filed Vitals:   10/03/14 0919  BP: 179/79  Pulse: 76  Temp: 97 F (36.1 C)  TempSrc: Oral  Resp: 20  SpO2: 95%   Wt Readings from Last 3 Encounters:  10/02/14 247 lb 12.8 oz (112.4 kg)  07/31/14 232 lb (105.235 kg)  06/13/14 224 lb 12 oz (101.946 kg)   General- elderly female, obese, in no acute distress Head- normocephalic, atraumatic Throat- moist mucus membrane Eyes- PERRLA, EOMI, no pallor, no icterus, no discharge, normal conjunctiva, normal sclera Neck- no cervical lymphadenopathy, no jugular vein distension Cardiovascular- normal s1,s2, no murmurs, palpable dorsalis pedis, trace leg edema Respiratory- bilateral clear to auscultation, positive for wheeze, no rhonchi, no crackles, no use of accessory  muscles Abdomen- bowel sounds present, soft, non tender Musculoskeletal- able to move all 4 extremities, no spinal and paraspinal tenderness, generalized weakness Neurological- no focal deficit, alert and oriented to person, place and time Skin- warm and dry Psychiatry- normal mood and affect    Labs reviewed: Basic Metabolic Panel:  Recent Labs  09/27/14 1946 09/28/14 0930 09/29/14 0331 10/01/14 0326 10/02/14 0544  NA  --  139 138 138 139  K  --  4.0 4.4 4.5 4.3  CL  --  111 109 109 110  CO2  --  23 21* 23 24  GLUCOSE  --  128* 140* 166* 95  BUN  --  19 15 26* 30*  CREATININE  --  1.59* 1.27* 1.20* 1.22*  CALCIUM  --  7.3* 7.7* 8.5* 8.5*  MG 1.9 1.9  --  2.3 2.3  PHOS 2.6  --   --   --   --    Liver Function Tests:  Recent Labs  05/10/14 1519 09/27/14 1408 09/28/14 0930  AST '15 18 20  '$ ALT 16 13* 13*  ALKPHOS 88 72 67  BILITOT 0.6 0.6 0.4  PROT 6.6 5.7* 5.5*  ALBUMIN 3.5 2.8* 2.3*   No results for input(s): LIPASE, AMYLASE in the last 8760 hours. No results for input(s): AMMONIA in the last 8760 hours. CBC:  Recent Labs  09/28/14 0930 09/29/14 0331 10/01/14 0326  WBC 12.4* 11.6* 6.9  NEUTROABS 10.2* 8.8* 6.1  HGB 7.0* 9.0* 8.9*  HCT 25.3* 30.5* 29.7*  MCV 77.8* 78.4 77.1*  PLT 192 212 205   Cardiac Enzymes: No results for input(s): CKTOTAL, CKMB, CKMBINDEX, TROPONINI in the last 8760 hours. BNP: Invalid input(s): POCBNP CBG:  Recent Labs  10/02/14 0449 10/02/14 0808 10/02/14 1219  GLUCAP 110* 92 135*   Lab Results  Component Value Date   HGBA1C 6.1* 09/29/2014     Radiological Exams: Dg Chest Port 1 View  09/27/2014   CLINICAL DATA:  COPD, dizziness, fever.  EXAM: PORTABLE CHEST - 1 VIEW  COMPARISON:  Chest x-ray dated 07/31/2014.  FINDINGS: Heart size is upper normal, unchanged. Overall cardiomediastinal silhouette is stable in size and configuration. Lungs are clear. Incidental note made of a prominent epicardial fat pad at the left  lung base. No evidence of pneumonia. No pleural effusion. No pneumothorax. No acute osseous abnormality.  IMPRESSION: No evidence of acute cardiopulmonary abnormality. No evidence of pneumonia.   Electronically Signed   By: Franki Cabot M.D.   On: 09/27/2014 13:55   09/28/14 echocardiogram Study Conclusions - Left ventricle: Technically difficult study. There is good LV   function. The cavity size was normal. Wall thickness was   increased in a pattern of mild LVH. Systolic function was normal.   The estimated ejection fraction was in the range of 55% to 60%.   Regional wall motion abnormalities cannot be excluded. - Aortic valve: Sclerosis without stenosis. There was no   regurgitation. - Mitral valve: Calcified annulus. - Left atrium: The atrium was mildly dilated. - Right ventricle: The cavity size was normal. Systolic function   was normal.    Assessment/Plan  Physical deconditioning Will have her work with physical therapy and occupational therapy team to help with gait training and muscle strengthening exercises.fall precautions. Skin care. Encourage to be out of bed.   Copd Recent exacerbation. Wheezing present on exam. Continue proair, symbicort and add duoneb tid for 5 days, then q8h prn. Will need incentive spirometer for pulmonary toileting  E.coli bacteremia Afebrile, vital signs stable, continue and complete course of ceftin 500 mg bid until 10/06/14 and monitor wbc and temp curve  Acute renal impairment Monitor bmp for now  Iron deficiency anemia S/p 2 u prbc transfusion. Continue ferrous sulfate 325 mg tid with vitamin c, check cbc in 1 week  chf No signs of fluid overload, continue cozaar 25 mg daily, monitor weight and bp. Was taking irbesartan at home, changed to cozaar for formulary reason.   OSA To use cpap at night time, monitor clinically  Depression and anxiety Stable mood. Continue prozac 30 mg bid with ativan 0.5 mg bid prn  Hypothyroid Stable,  continue levothyroxine home regimen, no changes made  Dm Diet controlled, off januvia, monitor cbg daily for two week and then once a week. Reviewed a1c.   Goals of care: short term rehabilitation   Labs/tests ordered: cbc, cmp 1 week  Family/ staff Communication: reviewed care plan with patient and nursing supervisor    Blanchie Serve, MD  [facility] [phone] (Monday-Friday 8 am - 5 pm) [phone] (afterhours)

## 2014-10-03 NOTE — Telephone Encounter (Signed)
Pt husband aware of rec's per VS Order placed for CPAP pressure change to [facility] Nothing further needed.

## 2014-10-07 ENCOUNTER — Non-Acute Institutional Stay (SKILLED_NURSING_FACILITY): Payer: BLUE CROSS/BLUE SHIELD | Admitting: Nurse Practitioner

## 2014-10-07 DIAGNOSIS — R609 Edema, unspecified: Secondary | ICD-10-CM

## 2014-10-07 DIAGNOSIS — F411 Generalized anxiety disorder: Secondary | ICD-10-CM

## 2014-10-07 DIAGNOSIS — I5032 Chronic diastolic (congestive) heart failure: Secondary | ICD-10-CM | POA: Diagnosis not present

## 2014-10-07 DIAGNOSIS — J449 Chronic obstructive pulmonary disease, unspecified: Secondary | ICD-10-CM | POA: Diagnosis not present

## 2014-10-07 DIAGNOSIS — D509 Iron deficiency anemia, unspecified: Secondary | ICD-10-CM | POA: Diagnosis not present

## 2014-10-07 DIAGNOSIS — N390 Urinary tract infection, site not specified: Secondary | ICD-10-CM | POA: Diagnosis not present

## 2014-10-07 LAB — CULTURE, BLOOD (ROUTINE X 2)
Culture: NO GROWTH
Culture: NO GROWTH

## 2014-10-07 NOTE — Progress Notes (Signed)
Patient ID: Alexandria [name], female   DOB: [birth year], 79 y.o.   MRN: [id number]    Nursing Home Location:  [place] of Service: SNF [phone])  PCP: Elsie Stain, MD  Allergies  Allergen Reactions  . Clarithromycin     REACTION: diff swallowing and mouth blisters  . Codeine Other (See Comments)    Unknown   . Spiriva Handihaler [Tiotropium Bromide Monohydrate] Other (See Comments)    Voice changes  . Sulfonamide Derivatives     REACTION: closed throat    Chief Complaint  Patient presents with  . Acute Visit    swelling, shortness of breath, pt request to see provider    HPI:  Patient is a 79 y.o. female seen today at [facility] at the request of nursing for clarifications of medications. She has hx of copd, OSA, chf, type 2 DM, hypothyroidism and anxiety. Pt here for short term rehabilitation post hospital admission from 09/27/14-10/02/14 with sepsis in setting of e.coli bacteremia, hypovolemia, acute renal failure and copd exacerbation. Staff notes shortness of breath and LE edema. Nursing reports there was some question about being on lasix at home and now she is not on any diuretic.  Pt seen in her room with daughter. Would like to go over all medications. Unaware of any hx of CHF has never been told this. Never on diuretic. Swelling has actually IMPROVED since hospital but has not gone completely away at this time.  Currently on duonebs due to COPD which have been helping with shortness of breath.   Review of Systems:  Review of Systems  Constitutional: Negative for appetite change, fatigue and unexpected weight change.  HENT: Negative for congestion and hearing loss.   Eyes: Negative.   Respiratory: Negative for cough and shortness of breath.   Cardiovascular: Positive for leg swelling. Negative for chest pain and palpitations.  Gastrointestinal: Negative for abdominal pain, diarrhea and constipation.  Genitourinary: Negative for  dysuria and difficulty urinating.  Musculoskeletal: Negative for myalgias and arthralgias.  Skin: Negative for color change and wound.  Neurological: Negative for dizziness and weakness.  Psychiatric/Behavioral: Positive for agitation. Negative for behavioral problems and confusion. The patient is nervous/anxious.     Past Medical History  Diagnosis Date  . Depression 12/07/1998  . Hypothyroidism 03/08/1968  . Hypertension 03/08/1992  . Hyperlipidemia 10/04/2000  . Diabetes mellitus type II 02/05/2006    Dr. Elyse Hsu with endo  . COPD (chronic obstructive pulmonary disease) 03/08/1998    PFTs 12/12/2002 FEV 1 1.42 (64%) ratio 58 with no better after B2 and DLCO75%; PFTs 11/20/09 FEV1 1.50 (73%) ratio 50 no better after B2 with DLCO 62%; Hfa 50% 11/20/2009 >75%, 01/13/10 p coaching  . Ventral hernia   . Esophagitis   . Allergy, unspecified not elsewhere classified   . Disorders of bursae and tendons in shoulder region, unspecified     Rotator cuff syndrome, right  . Obesity     NOS  . Peripheral neuropathy     Likely due to DM per Dr. Erling Cruz  . OSA (obstructive sleep apnea)     PSG 01/27/10 AHI 13, pt does not know CPAP settings  . GERD (gastroesophageal reflux disease)   . Chronic diastolic congestive heart failure    Past Surgical History  Procedure Laterality Date  . Tonsillectomy    . Rotator cuff repair  1984    Right, [place]  . Cesarean section      x2  [place]ree[place]/ repeat  . Total abdominal hysterectomy  1985    Due to dysmennorhea  . Cholecystectomy  1997  . Thumb release  12/1997    Right  . Carpal tunnel release  12/1997    Right  . Esophagogastroduodenoscopy  12/05/1997    Nml (due to hoarseness)  . Abd u/s  03/19/1999    Nml x2 foci in liver  . Ct abd w & pelvis wo/w cm      Abd hemangiomas of liver, 1 cm R renal cyst  . Cardiolite persantine  08/24/2000    Nml  . Carotid u/s  08/24/2000    1-39% ICA stenosis  . Dexa  07/03/2003    Nml  . Adenosine myoview   06/02/2007    Nml  . Carotid u/s  06/02/2007    No apprec change   . US echocardiography  06/02/2007  . Hernia repair  01/24/2009    Lap Ventr w/ Lysis of adhesions (Dr. Donne Hazel)  . Knee arthroscopic surgery  years ago    right  . Shoulder open rotator cuff repair  02/08/2012    Procedure: ROTATOR CUFF REPAIR SHOULDER OPEN;  Surgeon: Magnus Sinning, MD;  Location: [facility];  Service: Orthopedics;  Laterality: Left;  Left Shoulder Open Anterior Acrominectomy Rotator Cuff Repair Open Distal Clavicle Resection ,tissue mend graft, and repair of biceps tendon  . Gallbladder surgery     Social History:   reports that she quit smoking about 9 years ago. Her smoking use included Cigarettes. She has a 100 pack-year smoking history. She has never used smokeless tobacco. She reports that she drinks about 1.2 oz of alcohol per week. She reports that she does not use illicit drugs.  Family History  Problem Relation Age of Onset  . Heart disease Mother   . Thyroid disease Mother   . Emphysema Father     One lung  . Breast cancer Sister   . Cystic fibrosis Sister   . Hyperthyroidism Sister   . Esophageal cancer Neg Hx   . Cancer Neg Hx     Head or neck  . Osteoarthritis Brother   . Hyperthyroidism Brother     Medications: Patient's Medications  New Prescriptions   No medications on file  Previous Medications   ALBUTEROL (PROAIR HFA) 108 (90 BASE) MCG/ACT INHALER    Inhale 2 puffs into the lungs every 6 (six) hours as needed for wheezing or shortness of breath.   ASPIRIN 81 MG EC TABLET    Take 81 mg by mouth daily.     BUDESONIDE-FORMOTEROL (SYMBICORT) 160-4.5 MCG/ACT INHALER    INHALE 2 PUFFS FIRST THING IN THE MORNING AND 2 PUFFS AGAIN IN THE EVENING ABOUT 12 HOURS LATER   COENZYME Q-10 200 MG CAPS    Take 1 capsule by mouth daily.     CYANOCOBALAMIN (B-12 PO)    Take 1 tablet by mouth daily.   EPINEPHRINE (EPIPEN) 0.3 MG/0.3 ML DEVI    Inject 0.3 mg into the muscle as directed.     ERGOCALCIFEROL (VITAMIN D2) 50000 UNITS CAPSULE    Take 50,000 Units by mouth every Friday.    EZETIMIBE (ZETIA) 10 MG TABLET    Take 1 tablet (10 mg total) by mouth at bedtime.   FAMOTIDINE (PEPCID) 20 MG TABLET    Take 20 mg by mouth at bedtime.    FERROUS SULFATE 325 (65 FE) MG TABLET    Take 1 tablet (325 mg total) by mouth 3 (three)  times daily with meals.   FLUOXETINE (PROZAC) 10 MG CAPSULE    TAKE 3 CAPSULES IN THE MORNING AND TAKE 3 CAPSULES IN THE EVENING   FLUTICASONE (FLONASE) 50 MCG/ACT NASAL SPRAY    USE 2 SPRAYS INTO THE NOSE EVERY 12 HOURS AS NEEDED FOR STUFFY NOSE   IBUPROFEN (ADVIL,MOTRIN) 200 MG TABLET    Take 200-400 mg by mouth every 6 (six) hours as needed for moderate pain.    LEVOTHYROXINE (SYNTHROID, LEVOTHROID) 150 MCG TABLET    Take 150 mcg by mouth daily before breakfast. Can not take generic. Must be synthroid   LORAZEPAM (ATIVAN) 0.5 MG TABLET    Take 1 tablet (0.5 mg total) by mouth 2 (two) times daily as needed.   LOSARTAN (COZAAR) 25 MG TABLET    Take 25 mg by mouth daily.   VITAMIN C (VITAMIN C) 500 MG TABLET    Take 1 tablet (500 mg total) by mouth 2 (two) times daily.  Modified Medications   No medications on file  Discontinued Medications   CEFUROXIME (CEFTIN) 500 MG TABLET    Take 1 tablet (500 mg total) by mouth 2 (two) times daily with a meal.     Physical Exam: Filed Vitals:   10/07/14 1431  BP: 158/75  Pulse: 80  Temp: 98.7 F (37.1 C)  Resp: 20  Weight: 228 lb 9.6 oz (103.692 kg)  SpO2: 95%    Physical Exam  Constitutional: She is oriented to person, place, and time. She appears well-developed and well-nourished. No distress.  HENT:  Head: Normocephalic and atraumatic.  Mouth/Throat: Oropharynx is clear and moist. No oropharyngeal exudate.  Eyes: Conjunctivae are normal. Pupils are equal, round, and reactive to light.  Neck: Normal range of motion. Neck supple.  Cardiovascular: Normal rate, regular rhythm and normal heart sounds.     Pulmonary/Chest: Effort normal and breath sounds normal.  Abdominal: Soft. Bowel sounds are normal.  Musculoskeletal: She exhibits edema (1+ bilterally). She exhibits no tenderness.  Neurological: She is alert and oriented to person, place, and time.  Skin: Skin is warm and dry. She is not diaphoretic.  Psychiatric: Her mood appears anxious.    Labs reviewed: Basic Metabolic Panel:  Recent Labs  09/27/14 1946 09/28/14 0930 09/29/14 0331 10/01/14 0326 10/02/14 0544  NA  --  139 138 138 139  K  --  4.0 4.4 4.5 4.3  CL  --  111 109 109 110  CO2  --  23 21* 23 24  GLUCOSE  --  128* 140* 166* 95  BUN  --  19 15 26* 30*  CREATININE  --  1.59* 1.27* 1.20* 1.22*  CALCIUM  --  7.3* 7.7* 8.5* 8.5*  MG 1.9 1.9  --  2.3 2.3  PHOS 2.6  --   --   --   --    Liver Function Tests:  Recent Labs  05/10/14 1519 09/27/14 1408 09/28/14 0930  AST '15 18 20  '$ ALT 16 13* 13*  ALKPHOS 88 72 67  BILITOT 0.6 0.6 0.4  PROT 6.6 5.7* 5.5*  ALBUMIN 3.5 2.8* 2.3*   No results for input(s): LIPASE, AMYLASE in the last 8760 hours. No results for input(s): AMMONIA in the last 8760 hours. CBC:  Recent Labs  09/28/14 0930 09/29/14 0331 10/01/14 0326  WBC 12.4* 11.6* 6.9  NEUTROABS 10.2* 8.8* 6.1  HGB 7.0* 9.0* 8.9*  HCT 25.3* 30.5* 29.7*  MCV 77.8* 78.4 77.1*  PLT 192 212 205   TSH:  Recent Labs  09/29/14 0855  TSH 0.341*   A1C: Lab Results  Component Value Date   HGBA1C 6.1* 09/29/2014   Lipid Panel:  Recent Labs  09/29/14 0730  CHOL 91  HDL 37*  LDLCALC 40  TRIG 71  CHOLHDL 2.5     Assessment/Plan 1. Chronic diastolic congestive heart failure -mild CHF noted on echo done on 09/28/14, pt reports she had never been told of diagnosis, daughter was unaware. Discussed echo report which was reported on discharge summary:  Wall thickness wasincreased in a pattern of mild LVH. Systolic function was normal.The estimated ejection fraction was in the range of 55% to  60% -swelling in lower extremities since hospitalization however has never had this issues before and swelling has remained stable. No worsening increase shortness of breath  2. COPD GOLD II Recent hospitalization for COPD exacerbation conts on duonebs TID for total for 5 days and then PRN conts on symbicort  3. Escherichia coli bacteremia -thought to be from UTI however urine negative, completed 14 day total course of ceftin 500 mg BID  4. Anxiety state -conts on prozac 30 mg twice daily which was recently increased, pt was previously on xanax then changed to ativan, currently not on any scheduled benzo but has order for ativan twice daily as needed, this was discussed in length with daughter.   5. Iron deficiency anemia conts on ferrous sulfate 325 mg TID with meals, has follow up CBC scheduled.  6. Edema  -improved from hospital but has not resolved, encouraged to elevate legs as tolerates, low sodium diet, and will order compression hose    40 mins  TOTAL:  time greater than 50% of total time spent doing pt counseled and coordination of care and education regarding multiple chronic diseases, all medications reviewed in detail and plan of care.   Carlos American. Harle Battiest  [facility] [phone] 8 am - 5 pm) [phone] (after hours)

## 2014-10-14 ENCOUNTER — Non-Acute Institutional Stay (SKILLED_NURSING_FACILITY): Payer: BLUE CROSS/BLUE SHIELD | Admitting: Nurse Practitioner

## 2014-10-14 DIAGNOSIS — E038 Other specified hypothyroidism: Secondary | ICD-10-CM | POA: Diagnosis not present

## 2014-10-14 DIAGNOSIS — R7881 Bacteremia: Secondary | ICD-10-CM

## 2014-10-14 DIAGNOSIS — J449 Chronic obstructive pulmonary disease, unspecified: Secondary | ICD-10-CM

## 2014-10-14 DIAGNOSIS — F411 Generalized anxiety disorder: Secondary | ICD-10-CM | POA: Diagnosis not present

## 2014-10-14 DIAGNOSIS — D509 Iron deficiency anemia, unspecified: Secondary | ICD-10-CM | POA: Diagnosis not present

## 2014-10-14 DIAGNOSIS — B370 Candidal stomatitis: Secondary | ICD-10-CM

## 2014-10-14 DIAGNOSIS — R609 Edema, unspecified: Secondary | ICD-10-CM

## 2014-10-14 DIAGNOSIS — I5032 Chronic diastolic (congestive) heart failure: Secondary | ICD-10-CM | POA: Diagnosis not present

## 2014-10-14 NOTE — Progress Notes (Signed)
Patient ID: Alexandria [name], female   DOB: 01/27/1936, 79 y.o.   MRN: [id number]    Nursing Home Location:  [place] of Service: SNF [phone])  PCP: Elsie Stain, MD  Allergies  Allergen Reactions  . Clarithromycin     REACTION: diff swallowing and mouth blisters  . Codeine Other (See Comments)    Unknown   . Spiriva Handihaler [Tiotropium Bromide Monohydrate] Other (See Comments)    Voice changes  . Sulfonamide Derivatives     REACTION: closed throat    Chief Complaint  Patient presents with  . Discharge Note    HPI:  Patient is a 79 y.o. female seen today at [facility] for discharge home. She has hx of copd, OSA, chf, type 2 DM, hypothyroidism and anxiety. Pt here for short term rehabilitation post hospital admission from 09/27/14-10/02/14 with sepsis in setting of e.coli bacteremia, hypovolemia, acute renal failure and copd exacerbation. Pt has weaned from all O2, breathing is stable and back to baseline. Anxiety controlled on current Prozac and as needed ativan. Notes feeling of thrush inside mouth. slight tenderness to oral cavity, no trouble swallowing or pain with swallowing; Patient currently doing well with therapy, now stable to discharge home with home health.   Review of Systems:  Review of Systems  Constitutional: Negative for appetite change, fatigue and unexpected weight change.  HENT: Negative for congestion and hearing loss.   Eyes: Negative.   Respiratory: Negative for cough and shortness of breath.   Cardiovascular: Negative for chest pain, palpitations and leg swelling.  Gastrointestinal: Negative for abdominal pain, diarrhea and constipation.  Genitourinary: Negative for dysuria and difficulty urinating.  Musculoskeletal: Negative for myalgias and arthralgias.  Skin: Negative for color change and wound.  Neurological: Negative for dizziness and weakness.  Psychiatric/Behavioral: Negative for behavioral problems,  confusion and agitation. The patient is nervous/anxious.     Past Medical History  Diagnosis Date  . Depression 12/07/1998  . Hypothyroidism 03/08/1968  . Hypertension 03/08/1992  . Hyperlipidemia 10/04/2000  . Diabetes mellitus type II 02/05/2006    Dr. Elyse Hsu with endo  . COPD (chronic obstructive pulmonary disease) 03/08/1998    PFTs 12/12/2002 FEV 1 1.42 (64%) ratio 58 with no better after B2 and DLCO75%; PFTs 11/20/09 FEV1 1.50 (73%) ratio 50 no better after B2 with DLCO 62%; Hfa 50% 11/20/2009 >75%, 01/13/10 p coaching  . Ventral hernia   . Esophagitis   . Allergy, unspecified not elsewhere classified   . Disorders of bursae and tendons in shoulder region, unspecified     Rotator cuff syndrome, right  . Obesity     NOS  . Peripheral neuropathy     Likely due to DM per Dr. Erling Cruz  . OSA (obstructive sleep apnea)     PSG 01/27/10 AHI 13, pt does not know CPAP settings  . GERD (gastroesophageal reflux disease)   . Chronic diastolic congestive heart failure    Past Surgical History  Procedure Laterality Date  . Tonsillectomy    . Rotator cuff repair  1984    Right, [place]  . Cesarean section      x2 Breech/ repeat  . Total abdominal hysterectomy  1985    Due to dysmennorhea  . Cholecystectomy  1997  . Thumb release  12/1997    Right  . Carpal tunnel release  12/1997    Right  . Esophagogastroduodenoscopy  12/05/1997    Nml (due to hoarseness)  .  Abd u/s  03/19/1999    Nml x2 foci in liver  . Ct abd w & pelvis wo/w cm      Abd hemangiomas of liver, 1 cm R renal cyst  . Cardiolite persantine  08/24/2000    Nml  . Carotid u/s  08/24/2000    1-39% ICA stenosis  . Dexa  07/03/2003    Nml  . Adenosine myoview  06/02/2007    Nml  . Carotid u/s  06/02/2007    No apprec change   . US echocardiography  06/02/2007  . Hernia repair  01/24/2009    Lap Ventr w/ Lysis of adhesions (Dr. Donne Hazel)  . Knee arthroscopic surgery  years ago    right  . Shoulder open  rotator cuff repair  02/08/2012    Procedure: ROTATOR CUFF REPAIR SHOULDER OPEN;  Surgeon: Magnus Sinning, MD;  Location: [facility];  Service: Orthopedics;  Laterality: Left;  Left Shoulder Open Anterior Acrominectomy Rotator Cuff Repair Open Distal Clavicle Resection ,tissue mend graft, and repair of biceps tendon  . Gallbladder surgery     Social History:   reports that she quit smoking about 9 years ago. Her smoking use included Cigarettes. She has a 100 pack-year smoking history. She has never used smokeless tobacco. She reports that she drinks about 1.2 oz of alcohol per week. She reports that she does not use illicit drugs.  Family History  Problem Relation Age of Onset  . Heart disease Mother   . Thyroid disease Mother   . Emphysema Father     One lung  . Breast cancer Sister   . Cystic fibrosis Sister   . Hyperthyroidism Sister   . Esophageal cancer Neg Hx   . Cancer Neg Hx     Head or neck  . Osteoarthritis Brother   . Hyperthyroidism Brother     Medications: Patient's Medications  New Prescriptions   No medications on file  Previous Medications   ALBUTEROL (PROAIR HFA) 108 (90 BASE) MCG/ACT INHALER    Inhale 2 puffs into the lungs every 6 (six) hours as needed for wheezing or shortness of breath.   ASPIRIN 81 MG EC TABLET    Take 81 mg by mouth daily.     BUDESONIDE-FORMOTEROL (SYMBICORT) 160-4.5 MCG/ACT INHALER    INHALE 2 PUFFS FIRST THING IN THE MORNING AND 2 PUFFS AGAIN IN THE EVENING ABOUT 12 HOURS LATER   COENZYME Q-10 200 MG CAPS    Take 1 capsule by mouth daily.     CYANOCOBALAMIN (B-12 PO)    Take 1 tablet by mouth daily.   EPINEPHRINE (EPIPEN) 0.3 MG/0.3 ML DEVI    Inject 0.3 mg into the muscle as directed.    ERGOCALCIFEROL (VITAMIN D2) 50000 UNITS CAPSULE    Take 50,000 Units by mouth every Friday.    EZETIMIBE (ZETIA) 10 MG TABLET    Take 1 tablet (10 mg total) by mouth at bedtime.   FAMOTIDINE (PEPCID) 20 MG TABLET    Take 20 mg by mouth at bedtime.     FERROUS SULFATE 325 (65 FE) MG TABLET    Take 1 tablet (325 mg total) by mouth 3 (three) times daily with meals.   FLUOXETINE (PROZAC) 10 MG CAPSULE    TAKE 3 CAPSULES IN THE MORNING AND TAKE 3 CAPSULES IN THE EVENING   FLUTICASONE (FLONASE) 50 MCG/ACT NASAL SPRAY    USE 2 SPRAYS INTO THE NOSE EVERY 12 HOURS AS NEEDED FOR STUFFY NOSE   IBUPROFEN (  ADVIL,MOTRIN) 200 MG TABLET    Take 200-400 mg by mouth every 6 (six) hours as needed for moderate pain.    LEVOTHYROXINE (SYNTHROID, LEVOTHROID) 150 MCG TABLET    Take 150 mcg by mouth daily before breakfast. Can not take generic. Must be synthroid   LORAZEPAM (ATIVAN) 0.5 MG TABLET    Take 1 tablet (0.5 mg total) by mouth 2 (two) times daily as needed.   LOSARTAN (COZAAR) 25 MG TABLET    Take 25 mg by mouth daily.   VITAMIN C (VITAMIN C) 500 MG TABLET    Take 1 tablet (500 mg total) by mouth 2 (two) times daily.  Modified Medications   No medications on file  Discontinued Medications   No medications on file     Physical Exam: Filed Vitals:   10/14/14 1126  BP: 136/69  Pulse: 80  Temp: 97.4 F (36.3 C)  Resp: 20    Physical Exam  Constitutional: She is oriented to person, place, and time. She appears well-developed and well-nourished. No distress.  HENT:  Head: Normocephalic and atraumatic.  Mouth/Throat: Oropharynx is clear and moist. No oropharyngeal exudate.  Eyes: Conjunctivae are normal. Pupils are equal, round, and reactive to light.  Neck: Normal range of motion. Neck supple.  Cardiovascular: Normal rate, regular rhythm and normal heart sounds.   Pulmonary/Chest: Effort normal and breath sounds normal.  Abdominal: Soft. Bowel sounds are normal.  Musculoskeletal: She exhibits no edema or tenderness.  Neurological: She is alert and oriented to person, place, and time.  Skin: Skin is warm and dry. She is not diaphoretic.    Labs reviewed: Basic Metabolic Panel:  Recent Labs  09/27/14 1946 09/28/14 0930 09/29/14 0331  10/01/14 0326 10/02/14 0544  NA  --  139 138 138 139  K  --  4.0 4.4 4.5 4.3  CL  --  111 109 109 110  CO2  --  23 21* 23 24  GLUCOSE  --  128* 140* 166* 95  BUN  --  19 15 26* 30*  CREATININE  --  1.59* 1.27* 1.20* 1.22*  CALCIUM  --  7.3* 7.7* 8.5* 8.5*  MG 1.9 1.9  --  2.3 2.3  PHOS 2.6  --   --   --   --    Liver Function Tests:  Recent Labs  05/10/14 1519 09/27/14 1408 09/28/14 0930  AST '15 18 20  '$ ALT 16 13* 13*  ALKPHOS 88 72 67  BILITOT 0.6 0.6 0.4  PROT 6.6 5.7* 5.5*  ALBUMIN 3.5 2.8* 2.3*   No results for input(s): LIPASE, AMYLASE in the last 8760 hours. No results for input(s): AMMONIA in the last 8760 hours. CBC:  Recent Labs  09/28/14 0930 09/29/14 0331 10/01/14 0326  WBC 12.4* 11.6* 6.9  NEUTROABS 10.2* 8.8* 6.1  HGB 7.0* 9.0* 8.9*  HCT 25.3* 30.5* 29.7*  MCV 77.8* 78.4 77.1*  PLT 192 212 205   TSH:  Recent Labs  09/29/14 0855  TSH 0.341*   A1C: Lab Results  Component Value Date   HGBA1C 6.1* 09/29/2014   Lipid Panel:  Recent Labs  09/29/14 0730  CHOL 91  HDL 37*  LDLCALC 40  TRIG 71  CHOLHDL 2.5   Result Date: 10/10/14 12:28 PM      Analyte   Result Value   Ref. Range    Units   Out of Range   Lab  WBC  8.2  4.0-10.5  K/uL    SLN  RBC  4.25  3.87-5.11  MIL/uL      Hemoglobin  9.7  12.0-15.0  g/dL  L    Hematocrit  32.6  36.0-46.0  %  L    MCV  76.7  78.0-100.0  fL  L    MCH  22.8  26.0-34.0  pg  L    MCHC  29.8  30.0-36.0  g/dL  L    RDW  18.7  11.5-15.5  %  H    Platelet Count  287  150-400  K/uL      MPV  9.5  8.6-12.4  fL      Comprehensive Metabolic Panel  Status: Final Out of Range  Result Date: 10/10/14 12:28 PM      Analyte   Result Value   Ref. Range    Units   Out of Range   Lab  Sodium  144  135-146  mmol/L    SLN  Potassium  5.3  3.5-5.3  mmol/L      Chloride  108  98-110  mmol/L      CO2  28  20-31  mmol/L      Glucose  104  65-99  mg/dL  H    BUN  16  7-25  mg/dL      Creatinine  1.09  0.60-0.93  mg/dL   H    Bilirubin, Total  0.5  0.2-1.2  mg/dL      Alkaline Phosphatase  76  33-130  U/L      AST/SGOT  10  10-35  U/L      ALT/SGPT  18  6-29  U/L      Total Protein  5.7  6.1-8.1  g/dL  L    Albumin  3.1  3.6-5.1  g/dL  L    Calcium  8.7  8.6-10.4  mg/dL   Assessment/Plan  1. COPD GOLD II Recent exacerbation has resolved, breathing and resp status back to baseline, conts on symbicort and proair.   2. Chronic diastolic congestive heart failure Euvolemic, conts cozaar 25 mg daily.   3. Anxiety state Stable, cont on prozac 30 mg twice daily with ativan as needed  4. Iron deficiency anemia hgb improving, cont iron TID with vit C  5. Edema Improved, cont elevation and TEDs  6. Bacteremia Resolved   7. Other specified hypothyroidism conts synthroid 150 mcg   8. Thrush, oral Will start magic mouthwash QID, to swish and spit 5 cc, take for 7 days. Discussed thrush prevention in proper oral care after inhalers   pt is stable for discharge-will need PT/OT per home health. DME needed Rolator to maintain current level of function. To be discharged with facility medication has follow up 2 days after discharge with PCP  Janett Billow K. Harle Battiest  [facility] [phone] 8 am - 5 pm) [phone] (after hours)

## 2014-10-16 ENCOUNTER — Inpatient Hospital Stay: Payer: BLUE CROSS/BLUE SHIELD | Admitting: Internal Medicine

## 2014-10-17 ENCOUNTER — Encounter: Payer: Self-pay | Admitting: Internal Medicine

## 2014-10-17 ENCOUNTER — Ambulatory Visit: Payer: BLUE CROSS/BLUE SHIELD | Admitting: Family Medicine

## 2014-10-17 ENCOUNTER — Ambulatory Visit (INDEPENDENT_AMBULATORY_CARE_PROVIDER_SITE_OTHER): Payer: BLUE CROSS/BLUE SHIELD | Admitting: Internal Medicine

## 2014-10-17 VITALS — BP 122/78 | HR 87 | Ht 64.0 in | Wt 219.0 lb

## 2014-10-17 DIAGNOSIS — E669 Obesity, unspecified: Secondary | ICD-10-CM

## 2014-10-17 DIAGNOSIS — J449 Chronic obstructive pulmonary disease, unspecified: Secondary | ICD-10-CM

## 2014-10-17 NOTE — Patient Instructions (Signed)
No change in your medications  I will let Dr Juanetta Gosling nurse know about cpap changes that you say Dr Halford Chessman recommended   Make sure  you see Dr Halford Chessman next available to regroup re longterm treatment of your sleep problem and your copd

## 2014-10-17 NOTE — Progress Notes (Signed)
Subjective:     Patient ID: Alexandria [name], female   DOB: 05/05/1935    MRN: [id number]   Brief patient profile:  56  yowf quit smoking Dec 2006 with GOLD II COPD FEV1 1.42 (64%) in 2004   HPI September 16, 2009 1st pulmonary office eval in emr She c/o worsening SOB for the past several yrs. She gets out of breath walking up stairs and walking flat surface approx 500 ft. She states that hot/humid weather makes breathing worse. supposed to be taking spiriva and advair but not consistent. better after proaire though hfa technique margnial. Notes mild reflux/ dysphagia/ hoarseness also rec .stop advair  Symbicort 2 puffs first thing in am and 2 puffs again in pm about 12 hours later  Take spiriva after symbicort  if needed proaire 2 puffs every 4 hours with goal of needing it less than twice daily  Nexium Take one 30-60 min before first meal of the day      01/10/2013 f/u ov/Alexandria [name] re: GOLD II COPD/ did not bring all active inhalers  Chief Complaint  Patient presents with  . Follow-up    Pt states that her breathing seems to be doing better. Has not used proair since her last visit.     Not limited from desired adls, overall very happy with just the symbicort and less throat irritation off spiriva rec Plan A= Automatic = Symbicort Take 2 puffs first thing in am and then another 2 puffs about 12 hours later. (we did in a year's supply today) Plan B = Backup = Only use your albuterol as a rescue medication to be used if you can't catch your breath by resting or doing a relaxed purse lip breathing pattern   08/17/2013 f/u ov/Alexandria [name] re: preop clearance on symbicort 160 2bid   Chief Complaint  Patient presents with  . Follow-up    Pt states needs pulmonary clearance for dental procedure. She has had this scheduled x 2- had to cancel due to bronchitis and then had sinus infection.  Pt states that her breathing is doing well today.  She c/o cough for the past 2 wks- mainly non prod, but occ will produce  minimal clear sputum.   1st surg canceled June 30 2013  2nd happened 30 days later  [facility] meds 3 days prior to OV  And feeling fine now. Not needing any rescue saba  Not limited by breathing from desired activities   rec No change rx Cleared for surgery    Admit date: 09/27/2014 Discharge date: 10/02/2014   Recommendations for Outpatient Follow-up:  COPD Exacerbation   -Most Likely Viral.  -Ambulatory SPO2; shows patient qualifies for home O2 see ambulatory SPO2 below.  -Continue home Symbicort 160-4.5 BID -Xopenex PRN wheezing/SOB  -Follow-up with Dr. Elsie Stain within 2 weeks to monitor anemia, Escherichia coli bacteremia.  OSA/Obesity Hypoventilation Syndrome -CPAP per respiratory; during this hospitalization patient was on auto titrating CPAP which reset her pressure requirement to 52mH2O. patient to request full mask at follow-up with patient will Dr. VChesley Mires Felt she tolerated CPAP better than with the nasal pillows  Chronic Diastolic CHF -Echo 177/10/2421 LVEF= 553-61% grade 1 diastolic dysfunction -Repeat echocardiogram; showed LVH/mildly dilated left atrium see results below -Transfuse for hemoglobin<8 -7/23 Transfuse 2 units PRBC -Patient to request that PCP, endocrinologist, pulmonologist occasionally monitor her H/H  Hypotension uncertain etiology:  -Most likely multifactorial to include anemia, OHS, OSA, UTI  -Resolved  Acute renal failure due to hypovolemia (baseline Cr~1.07) -  Cr Improved to 1.22 on day of discharge  Urinary tract infection/GNR Bacteremia vs contaminant? -Urine culture insignificant growth  Escherichia coli bacteremia -Patient will require 10 days of antibiotics -Will obtain repeat blood cultures in a.m.  Microcytic anemia/iron deficiency anemia  -7/23 Transfuse 2 units PRBC -Transfusion for hemoglobin< 8 -Anemia panel; c/w Fe deficiency anemia -Discharge on Fe-sulfate 325 mg TID -Discharge on vitamin C 500 mg  BID -Endocrinologist, Pulmonologist, PCP to occasionally monitor her H/H  DiabetesType 2 controlled -7/24 hemoglobin A1c= 6.1 -Lipid panel within ADA guidelines -Patient and was controlled on diet and Januvia 100 mg daily prior to hospitalization requiring steroid (steroid-induced). Steroid-induced exacerbation should stop upon discharge, should not require insulin.  -PCP to monitor A1c   Hypothyroidism -Continue Synthroid 150 g daily -7/24 TSH= 0.34, counseled patient would not increase her Synthroid while hospitalized but should be addressed with endocrinologist upon discharge   Anxiety -Continue Prozac 30 mg BID -Continue Ativan 0.5 mg QAm    Discharge Diagnoses:  Active Problems:  Hypotension  Sepsis  Anemia, iron deficiency  Other emphysema  OSA on CPAP  Obesity hypoventilation syndrome  Chronic diastolic congestive heart failure  Other specified hypotension  Acute renal failure syndrome  Acute cystitis without hematuria  Absolute anemia  Type 2 diabetes mellitus with complication  Other specified hypothyroidism  Type 2 diabetes mellitus with hyperglycemia  Anxiety state  Urinary tract infectious disease  Microcytic anemia  Diabetes type 2, controlled  Bacteremia, escherichia coli  Chronic diastolic CHF (congestive heart failure)  Escherichia coli urinary tract infection  Iron deficiency anemia   Discharge Condition: Stable  Diet recommendation: Heart healthy/American diabetic Association  Filed Weights   09/30/14 0500 10/01/14 0636 10/02/14 0500  Weight: 110 kg (242 lb 8.1 oz) 111.2 kg (245 lb 2.4 oz) 112.4 kg (247 lb 12.8 oz)    History of present illness:  79 year old WF PMHx Depression, HTN, HLD, COPD, OSA, GERD, Diabetes Type 2, Peripheral Neuropathy,   [facility] department on 09/27/2014 from her primary care physician's office. She been experiencing a fever to 102, body aches, and  generalized weakness for 1-2 days. She also noted some burning on urination. She been trying to eat and drink normally and she said her appetite had been well. She did not have diarrhea, nausea, vomiting, or chest pain. She had been coughing for about 1 week prior to admission. Notably, one week ago she was seen in emergency department for similar symptoms (low blood pressure, fatigue) in another state while traveling. She was treated briefly with fluids and was discharged. She said that none of her medications a change recently and she continues to take her losartan. She does not take any diuretics. She denies sick contacts. She denies rash. During his hospitalization patient was found to have acute on chronic hypoxia secondary to multiple reasons; OSA/OHS/severe anemia. Patient received 2 units PRBC and her H/H is stabilized. Patient was also started on iron+ vitamin C prior deficiency. In addition respiratory provided auto titrating CPAP which showed that her pressure requirement had increased to 74mH2O. In addition patient was diagnosed with Escherichia coli bacteremia and placed on appropriate antibiotics.    Consultants: Dr.Clinton D Young (PCCM)   Procedure/Significant Events: 7/23 Echocardiogram;- Left ventricle: mild LVH. -LVEF= 55% to 60%.-Regional wall motion abnormalities cannot be excluded. - Left atrium: mildly dilated. 7/24 ambulatory SPO2; SATURATION QUALIFICATIONS: (This note is used to comply with regulatory documentation for home oxygen) Patient Saturations on Room Air at Rest =  92% Patient Saturations on Room Air while Ambulating = 85% Patient Saturations on 2 Liters of oxygen while Ambulating = 97%      Culture 7/22 blood left hand NGTD; 7/22 blood positive in Rt AC ESCHERICHIA COLI 7/22 urine insignificant 7/24 influenza A/B/H1N1 negative 7/25 strep pneumo urine antigen negative 7/27 blood Lt/Rt AC NGTD   Antibiotics:  Zosyn 1 dose in ED Ceftriaxone 7/22>>  stopped 7/25 Ceftin 7/25>> Vancomycin >>> stopped 7/25      10/17/2014 post hosp f/u ov/Alexandria [name] re: GOLD II copd  Chief Complaint  Patient presents with  . HFU    Pt states that her breathing is better than it has been in years. She does c/o mild sore throat and had low grade temp x 1 day ago.    maint on symb 160 2bid and rare need for saba   No obvious daytime variabilty or assoc chronic cough or cp or chest tightness, subjective wheeze overt sinus or hb symptoms. No unusual exp hx or h/o childhood pna/ asthma or knowledge of premature birth.  Has not tried saba to see if helps  Sleeping ok without nocturnal  or early am exacerbation  of respiratory  c/o's or need for noct saba. Also denies any obvious fluctuation of symptoms with weather or environmental changes or other aggravating or alleviating factors except as outlined above    Current Medications, Allergies, Past Medical History, Past Surgical History, Family History, and Social History were reviewed in [employer] record.  ROS  The following are not active complaints unless bolded sore throat, dysphagia, dental problems, itching, sneezing,  nasal congestion or excess/ purulent secretions, ear ache,   fever, chills, sweats, unintended wt loss, pleuritic or exertional cp, hemoptysis,  orthopnea pnd or leg swelling, presyncope, palpitations, heartburn, abdominal pain, anorexia, nausea, vomiting, diarrhea  or change in bowel or urinary habits, change in stools or urine, dysuria,hematuria,  rash, arthralgias, visual complaints, headache, numbness weakness or ataxia or problems with walking or coordination,  change in mood/affect or memory.            Past Medical History:  Depression (12/07/1998)  Hypothyroidism (03/08/1968)  Hypertension (03/08/1992)  Hyperlipidemia (10/04/2000)  Diabetes mellitus, type II (02/05/2006)- Dr. Elyse Hsu with endo  COPD (03/08/1998)  - PFT's 12/12/2002 FEV1 1.42 (64%) ratio 58  with no better after B2 and DLCO75%  - PFT's 9/ 15/11 FEV1 1.50 (73%) ratio 50 no better after B2 with DLC0 62%  - hfa 50% November 20, 2009 > 75% January 13, 2010 p coaching >  75% 10/02/2010  VENTRAL HERNIA (ICD-553.20)  ESOPHAGITIS (ICD-530.10)  EDEMA (ICD-782.3)  SOB (ICD-786.05)  ALLERGY (ICD-995.3)  BRONCHITIS, OBSTRUCTIVE CHRONIC W/EXACRB (ICD-491.21)  ROTATOR CUFF SYNDROME, RIGHT (ICD-726.10)  SYNDROME, ROTATOR CUFF NOS (ICD-726.10)  OBESITY NOS (ICD-278.00)  NUMBNESS (ICD-782.0)  POSTMENOPAUSAL ON HORMONE REPLACEMENT THERAPY (ICD-V07.4)  Complex med regimen , med calendar 10/19/10     Family History:  Father dec 75 Emphysema One lung  Mother dec 93 Heart Thyrroid Dz  Brother A 45 Unknown  Brother A 3 Osteo Hyperthyr  Brother A 93 BACK Probs  Sister A 60 Breast Cysts Fibro Back Probs Hypothyr  no esophageal cancer  No known head and neck cancers.  no premature ascvd    Social History:  married, 2 children, she is retired, former smoker, drinks 2 alcoholic beverages per week, drinks 4 caffeinated beverages per day. Enjoys painting.  Former smoker. Quit on July 9th 2011. She smoked 40 yrs up to  1 ppd           Objective:   Physical Exam  Elderly amb wf NAD   wt 229 September 16, 2009 >  >235 10/19/10 >04/15/2011  234 >07/16/2011 226 > 232 08/24/2012 > 10/10/2012 234 > 01/10/2013 229 > 08/17/2013  230 > 10/17/2014 219    HEENT mild turbinate edema.  Oropharynx no thrush or excess pnd or cobblestoning.  No JVD or cervical adenopathy. Mild accessory muscle hypertrophy. Trachea midline, nl thryroid. Chest was hyperinflated by percussion with diminished breath sounds and mild  increased exp time without wheeze. Hoover sign positive at late inspiration. Regular rate and rhythm without murmur gallop or rub or increase P2 or edema.  Abd: no hsm, nl excursion. Ext warm without cyanosis or clubbing.         I personally reviewed images and agree with radiology impression as follows:   CXR:  09/30/14 1. Pulmonary vascular congestion. 2. Aortic atherosclerosis.      Assessment:

## 2014-10-18 ENCOUNTER — Ambulatory Visit (INDEPENDENT_AMBULATORY_CARE_PROVIDER_SITE_OTHER): Payer: BLUE CROSS/BLUE SHIELD | Admitting: Family Medicine

## 2014-10-18 ENCOUNTER — Encounter: Payer: Self-pay | Admitting: Family Medicine

## 2014-10-18 VITALS — BP 150/68 | HR 89 | Temp 97.7°F | Wt 219.2 lb

## 2014-10-18 DIAGNOSIS — B9620 Unspecified Escherichia coli [E. coli] as the cause of diseases classified elsewhere: Secondary | ICD-10-CM

## 2014-10-18 DIAGNOSIS — R7881 Bacteremia: Secondary | ICD-10-CM

## 2014-10-18 DIAGNOSIS — N179 Acute kidney failure, unspecified: Secondary | ICD-10-CM

## 2014-10-18 DIAGNOSIS — A419 Sepsis, unspecified organism: Secondary | ICD-10-CM

## 2014-10-18 DIAGNOSIS — E1140 Type 2 diabetes mellitus with diabetic neuropathy, unspecified: Secondary | ICD-10-CM

## 2014-10-18 DIAGNOSIS — E119 Type 2 diabetes mellitus without complications: Secondary | ICD-10-CM

## 2014-10-18 DIAGNOSIS — D649 Anemia, unspecified: Secondary | ICD-10-CM

## 2014-10-18 LAB — COMPREHENSIVE METABOLIC PANEL
ALT: 19 U/L (ref 0–35)
AST: 24 U/L (ref 0–37)
Albumin: 4 g/dL (ref 3.5–5.2)
Alkaline Phosphatase: 90 U/L (ref 39–117)
BUN: 27 mg/dL — ABNORMAL HIGH (ref 6–23)
CO2: 26 mEq/L (ref 19–32)
Calcium: 9.4 mg/dL (ref 8.4–10.5)
Chloride: 103 mEq/L (ref 96–112)
Creatinine, Ser: 1.06 mg/dL (ref 0.40–1.20)
GFR: 53.41 mL/min — ABNORMAL LOW (ref >60.00)
Glucose, Bld: 87 mg/dL (ref 70–99)
Potassium: 5.1 mEq/L (ref 3.5–5.1)
Sodium: 137 mEq/L (ref 135–145)
Total Bilirubin: 0.6 mg/dL (ref 0.2–1.2)
Total Protein: 7.3 g/dL (ref 6.0–8.3)

## 2014-10-18 LAB — CBC WITH DIFFERENTIAL/PLATELET
Basophils Absolute: 0 10*3/uL (ref 0.0–0.1)
Basophils Relative: 0.5 % (ref 0.0–3.0)
Eosinophils Absolute: 0.2 10*3/uL (ref 0.0–0.7)
Eosinophils Relative: 2.7 % (ref 0.0–5.0)
HCT: 36.7 % (ref 36.0–46.0)
Hemoglobin: 11.5 g/dL — ABNORMAL LOW (ref 12.0–15.0)
Lymphocytes Relative: 21.5 % (ref 12.0–46.0)
Lymphs Abs: 1.6 10*3/uL (ref 0.7–4.0)
MCHC: 31.5 g/dL (ref 30.0–36.0)
MCV: 78.2 fl (ref 78.0–100.0)
Monocytes Absolute: 0.7 10*3/uL (ref 0.1–1.0)
Monocytes Relative: 8.8 % (ref 3.0–12.0)
Neutro Abs: 5 10*3/uL (ref 1.4–7.7)
Neutrophils Relative %: 66.5 % (ref 43.0–77.0)
Platelets: 295 10*3/uL (ref 150.0–400.0)
RBC: 4.69 Mil/uL (ref 3.87–5.11)
RDW: 25.1 % — ABNORMAL HIGH (ref 11.5–15.5)
WBC: 7.5 10*3/uL (ref 4.0–10.5)

## 2014-10-18 LAB — FOLATE: Folate: 10.7 ng/mL (ref >5.9)

## 2014-10-18 LAB — TSH: TSH: 0.55 u[IU]/mL (ref 0.35–4.50)

## 2014-10-18 LAB — IBC PANEL
Iron: 58 ug/dL (ref 42–145)
Saturation Ratios: 15.3 % — ABNORMAL LOW (ref 20.0–50.0)
Transferrin: 270 mg/dL (ref 212.0–360.0)

## 2014-10-18 LAB — VITAMIN B12: Vitamin B-12: >1500 pg/mL — ABNORMAL HIGH (ref 211–911)

## 2014-10-18 NOTE — Progress Notes (Signed)
Pre visit review using our clinic review tool, if applicable. No additional management support is needed unless otherwise documented below in the visit note.  To recap. Admitted with sepsis, Escherichia coli bacteremia.  Also with COPD exacerbation, ARF, anemia. Here for f/u after SNF rehab, now back at home.  Done with 10 days of abx.  Due for f/u labs.  All questions re: labs, dx, and hospital course answered.   No black stools.  No known blood loss.  H/o abnormal TSH noted.   H/o DM2 and she wanted to get set up with Dr. Cruzita Lederer- this was requested by the patient.    She feels much better overall.   She was seen in ER in [place] before the hospitalization.  I don't have the CBC to review from that. We are requesting records.   Meds, vitals, and allergies reviewed.   ROS: See HPI.  Otherwise, noncontributory.  GEN: nad, alert and oriented HEENT: mucous membranes moist NECK: supple w/o LA, thyroid not ttp CV: rrr.  PULM: ctab, no inc wob ABD: soft, +bs EXT: trace/minimal edema SKIN: no acute rash

## 2014-10-18 NOTE — Patient Instructions (Addendum)
Alexandria [name] will call about your referral. Go to the lab on the way out.  We'll contact you with your lab report. We'll see about getting you set up with GI after I see your labs.  Take care.  Glad to see you.

## 2014-10-20 ENCOUNTER — Other Ambulatory Visit: Payer: Self-pay | Admitting: Family Medicine

## 2014-10-20 ENCOUNTER — Encounter: Payer: Self-pay | Admitting: Family Medicine

## 2014-10-20 DIAGNOSIS — D509 Iron deficiency anemia, unspecified: Secondary | ICD-10-CM

## 2014-10-20 NOTE — Assessment & Plan Note (Signed)
Refer to endo 

## 2014-10-20 NOTE — Assessment & Plan Note (Signed)
See notes on labs.  Resolved.

## 2014-10-20 NOTE — Assessment & Plan Note (Signed)
No fevers off abx now.  Would consider this treated. No reason to recollect blood cultures at this point.

## 2014-10-20 NOTE — Assessment & Plan Note (Signed)
See notes on labs.  >25 minutes spent in face to face time with patient, >50% spent in counselling or coordination of care.

## 2014-10-20 NOTE — Assessment & Plan Note (Signed)
Now resolved.  See notes on f/u labs.  >25 minutes spent in face to face time with patient, >50% spent in counselling or coordination of care .

## 2014-10-21 ENCOUNTER — Encounter: Payer: Self-pay | Admitting: Internal Medicine

## 2014-10-21 NOTE — Assessment & Plan Note (Signed)
Body mass index is 37.57 kg/(m^2).  Lab Results  Component Value Date   TSH 0.55 10/18/2014     Contributing to gerd tendency/ doe/reviewed need  achieve and maintain neg calorie balance > defer f/u primary care including intermittently monitoring thyroid status

## 2014-10-21 NOTE — Assessment & Plan Note (Signed)
-   PFT's 12/12/2002 FEV1 1.42 (64%) ratio 58 with no better after B2 and DLCO 75%  - PFT's 9/ 15/11 FEV1 1.50 (73%) ratio 50 no better after B2 with DLC0 62%  - PFT's 10/10/2012   1.29 (61%) ratio 57 and no better p B2, DLCO  67% and corrects to 81  >>referred to pulmonary rehab 10/19/2010 > some better doe - Alpha one AT genotype 04/15/2011 > MM -   08/24/2012  Walked RA x 1 laps @ 185 ft each stopped due to feet hurt > sob s desat - 10/17/2014 p extensive coaching HFA effectiveness =    90%   Main problem at this point is obesity / deconditioning p severe acute illness and no need to change maint rx  Does need f/u by Dr Halford Chessman for osa > copd   I had an extended discussion with the patient reviewing all relevant studies completed to date and  lasting 15 to 20 minutes of a 25 minute visit    Each maintenance medication was reviewed in detail including most importantly the difference between maintenance and prns and under what circumstances the prns are to be triggered using an action plan format that is not reflected in the computer generated alphabetically organized AVS.    Please see instructions for details which were reviewed in writing and the patient given a copy highlighting the part that I personally wrote and discussed at today's ov.

## 2014-10-24 ENCOUNTER — Telehealth: Payer: Self-pay | Admitting: Family Medicine

## 2014-10-24 NOTE — Telephone Encounter (Signed)
Call pt. Notes from clinic in [place] reviewed.  No CBC in the records.  Continue as planned, ie see GI and recheck CBC here as prev discussed.  Thanks.

## 2014-10-24 NOTE — Telephone Encounter (Signed)
Pt notified of Dr. Josefine Class instructions and verbalized understanding

## 2014-10-25 ENCOUNTER — Encounter: Payer: Self-pay | Admitting: *Deleted

## 2014-10-31 ENCOUNTER — Other Ambulatory Visit (INDEPENDENT_AMBULATORY_CARE_PROVIDER_SITE_OTHER): Payer: BLUE CROSS/BLUE SHIELD

## 2014-10-31 ENCOUNTER — Encounter: Payer: Self-pay | Admitting: Family Medicine

## 2014-10-31 ENCOUNTER — Ambulatory Visit (INDEPENDENT_AMBULATORY_CARE_PROVIDER_SITE_OTHER): Payer: BLUE CROSS/BLUE SHIELD | Admitting: Family Medicine

## 2014-10-31 VITALS — BP 158/70 | HR 93 | Temp 97.6°F | Wt 221.0 lb

## 2014-10-31 DIAGNOSIS — D509 Iron deficiency anemia, unspecified: Secondary | ICD-10-CM

## 2014-10-31 DIAGNOSIS — R300 Dysuria: Secondary | ICD-10-CM | POA: Diagnosis not present

## 2014-10-31 DIAGNOSIS — N3090 Cystitis, unspecified without hematuria: Secondary | ICD-10-CM

## 2014-10-31 DIAGNOSIS — D649 Anemia, unspecified: Secondary | ICD-10-CM | POA: Diagnosis not present

## 2014-10-31 LAB — CBC WITH DIFFERENTIAL/PLATELET
Basophils Absolute: 0 10*3/uL (ref 0.0–0.1)
Basophils Relative: 0.5 % (ref 0.0–3.0)
Eosinophils Absolute: 0.1 10*3/uL (ref 0.0–0.7)
Eosinophils Relative: 2.2 % (ref 0.0–5.0)
HCT: 36.1 % (ref 36.0–46.0)
Hemoglobin: 11.5 g/dL — ABNORMAL LOW (ref 12.0–15.0)
Lymphocytes Relative: 19.4 % (ref 12.0–46.0)
Lymphs Abs: 1.3 10*3/uL (ref 0.7–4.0)
MCHC: 31.9 g/dL (ref 30.0–36.0)
MCV: 81.6 fl (ref 78.0–100.0)
Monocytes Absolute: 0.5 10*3/uL (ref 0.1–1.0)
Monocytes Relative: 7.5 % (ref 3.0–12.0)
Neutro Abs: 4.7 10*3/uL (ref 1.4–7.7)
Neutrophils Relative %: 70.4 % (ref 43.0–77.0)
Platelets: 234 10*3/uL (ref 150.0–400.0)
RBC: 4.43 Mil/uL (ref 3.87–5.11)
RDW: 25 % — ABNORMAL HIGH (ref 11.5–15.5)
WBC: 6.7 10*3/uL (ref 4.0–10.5)

## 2014-10-31 LAB — IBC PANEL
Iron: 54 ug/dL (ref 42–145)
Saturation Ratios: 14.6 % — ABNORMAL LOW (ref 20.0–50.0)
Transferrin: 265 mg/dL (ref 212.0–360.0)

## 2014-10-31 LAB — POCT URINALYSIS DIPSTICK
Bilirubin, UA: NEGATIVE
Glucose, UA: NEGATIVE
Ketones, UA: NEGATIVE
Nitrite, UA: NEGATIVE
Protein, UA: NEGATIVE
Spec Grav, UA: >=1.03
Urobilinogen, UA: NEGATIVE
pH, UA: 6

## 2014-10-31 MED ORDER — CIPROFLOXACIN HCL 250 MG PO TABS: 250.0000 mg | ORAL_TABLET | Freq: Two times a day (BID) | ORAL | Status: DC

## 2014-10-31 NOTE — Patient Instructions (Addendum)
Check to see if you are still taking iron/ferrous sulfate.   If not, then restart it.   Stop B12 for now.  We'll contact you with your lab report. Start cipro in the meantime for a likely UTI.  If the tremor continues or worsens, then let me know.  Glad to see you.

## 2014-10-31 NOTE — Progress Notes (Signed)
Pre visit review using our clinic review tool, if applicable. No additional management support is needed unless otherwise documented below in the visit note.  She wasn't sure she was still on iron.  D/w pt.  Repeat CBC pending.    Dysuria: yes, some burning with urination.   duration of symptoms: ongoing.   abdominal pain: no Fevers: no back pain: no Vomiting: no Sepsis hx noted.   Abnormal U/a d/w pt.  ucx pending.    She hasn't needed BZD recently.  We left it on her med list for now, for PRN use.   She has had a B hand tremor for years.  This isn't different from baseline over the years per patient.    Meds, vitals, and allergies reviewed.   ROS: See HPI.  Otherwise negative.    GEN: nad, alert and oriented HEENT: mucous membranes moist NECK: supple CV: rrr.  PULM: ctab, no inc wob ABD: soft, +bs, suprapubic area tender EXT: no edema SKIN: no acute rash BACK: no CVA pain

## 2014-11-01 ENCOUNTER — Telehealth: Payer: Self-pay

## 2014-11-01 NOTE — Telephone Encounter (Signed)
Pt request the hgb value and what hgb is on 10/18/14 lab results; advised pt hgb on 10/18/14 was 11.5 and hgb is part of red blood cells that carries O2 throughout the body. Pt voiced understanding.

## 2014-11-02 LAB — URINE CULTURE: Colony Count: 75000

## 2014-11-03 ENCOUNTER — Other Ambulatory Visit: Payer: Self-pay | Admitting: Family Medicine

## 2014-11-03 DIAGNOSIS — D509 Iron deficiency anemia, unspecified: Secondary | ICD-10-CM

## 2014-11-03 DIAGNOSIS — N3090 Cystitis, unspecified without hematuria: Secondary | ICD-10-CM | POA: Insufficient documentation

## 2014-11-03 NOTE — Assessment & Plan Note (Signed)
See notes on labs.  >25 minutes spent in face to face time with patient, >50% spent in counselling or coordination of care  She wasn't sure is she was will on iron.   She can stay off B12 for now and we can recheck later on.

## 2014-11-03 NOTE — Assessment & Plan Note (Signed)
D/w pt.  Ucx, cipro, f/u prn.  Nontoxic.  Okay for oupatient f/u.

## 2014-11-07 ENCOUNTER — Other Ambulatory Visit: Payer: Self-pay | Admitting: Family Medicine

## 2014-11-07 NOTE — Telephone Encounter (Signed)
Fluoxetine last refilled 10/02/14 for #360 with 1 refill. Okay to refill?

## 2014-11-07 NOTE — Telephone Encounter (Signed)
I called the pharmacy to check about refills for her Fluoxetine. Pharmacist states that she does have refills left.

## 2014-11-07 NOTE — Telephone Encounter (Signed)
She should have refills left.  Please clarify with pharmacy. Thanks.

## 2014-11-08 NOTE — Telephone Encounter (Signed)
Spoke with patient and advised results   

## 2014-11-08 NOTE — Telephone Encounter (Signed)
Notify pt.  Thanks.   

## 2014-11-13 ENCOUNTER — Telehealth: Payer: Self-pay | Admitting: Family Medicine

## 2014-11-13 DIAGNOSIS — A419 Sepsis, unspecified organism: Secondary | ICD-10-CM

## 2014-11-13 NOTE — Telephone Encounter (Signed)
Pt requesting referral to outpatient PT. She thought she was going to qualify for in home PT, but the nurse has stated she is doing too well for that. cb number is [phone]

## 2014-11-14 NOTE — Telephone Encounter (Signed)
Notified patient that referral was placed, and that a referral coordinator should be contacting her soon. Thanks!

## 2014-11-14 NOTE — Telephone Encounter (Signed)
Order is put in.  Thanks.

## 2014-12-03 ENCOUNTER — Other Ambulatory Visit: Payer: BLUE CROSS/BLUE SHIELD

## 2014-12-03 ENCOUNTER — Encounter: Payer: Self-pay | Admitting: Internal Medicine

## 2014-12-03 ENCOUNTER — Ambulatory Visit (INDEPENDENT_AMBULATORY_CARE_PROVIDER_SITE_OTHER): Payer: BLUE CROSS/BLUE SHIELD | Admitting: Internal Medicine

## 2014-12-03 VITALS — BP 132/72 | HR 86 | Temp 97.9°F | Resp 12 | Wt 224.0 lb

## 2014-12-03 DIAGNOSIS — E039 Hypothyroidism, unspecified: Secondary | ICD-10-CM | POA: Diagnosis not present

## 2014-12-03 DIAGNOSIS — E1140 Type 2 diabetes mellitus with diabetic neuropathy, unspecified: Secondary | ICD-10-CM | POA: Diagnosis not present

## 2014-12-03 NOTE — Patient Instructions (Addendum)
Please stay off Januvia for now.  Take the thyroid hormone every day, with water, >30 minutes before breakfast, separated by >4 hours from acid reflux medications, calcium, iron, multivitamins.  For now, continue Synthroid 150 mcg 6/7 days, 75 mcg 1/7 days.  Please come back for a follow-up appointment in 3 months.  PATIENT INSTRUCTIONS FOR TYPE 2 DIABETES:  **Please join MyChart!** - see attached instructions about how to join if you have not done so already.  DIET AND EXERCISE Diet and exercise is an important part of diabetic treatment.  We recommended aerobic exercise in the form of brisk walking (working between 40-60% of maximal aerobic capacity, similar to brisk walking) for 150 minutes per week (such as 30 minutes five days per week) along with 3 times per week performing 'resistance' training (using various gauge rubber tubes with handles) 5-10 exercises involving the major muscle groups (upper body, lower body and core) performing 10-15 repetitions (or near fatigue) each exercise. Start at half the above goal but build slowly to reach the above goals. If limited by weight, joint pain, or disability, we recommend daily walking in a swimming pool with water up to waist to reduce pressure from joints while allow for adequate exercise.    BLOOD GLUCOSES Monitoring your blood glucoses is important for continued management of your diabetes. Please check your blood glucoses 2-4 times a day: fasting, before meals and at bedtime (you can rotate these measurements - e.g. one day check before the 3 meals, the next day check before 2 of the meals and before bedtime, etc.).   HYPOGLYCEMIA (low blood sugar) Hypoglycemia is usually a reaction to not eating, exercising, or taking too much insulin/ other diabetes drugs.  Symptoms include tremors, sweating, hunger, confusion, headache, etc. Treat IMMEDIATELY with 15 grams of Carbs: . 4 glucose tablets .  cup regular juice/soda . 2 tablespoons  raisins . 4 teaspoons sugar . 1 tablespoon honey Recheck blood glucose in 15 mins and repeat above if still symptomatic/blood glucose <100.  RECOMMENDATIONS TO REDUCE YOUR RISK OF DIABETIC COMPLICATIONS: * Take your prescribed MEDICATION(S) * Follow a DIABETIC diet: Complex carbs, fiber rich foods, (monounsaturated and polyunsaturated) fats * AVOID saturated/trans fats, high fat foods, >2,300 mg salt per day. * EXERCISE at least 5 times a week for 30 minutes or preferably daily.  * DO NOT SMOKE OR DRINK more than 1 drink a day. * Check your FEET every day. Do not wear tightfitting shoes. Contact us if you develop an ulcer * See your EYE doctor once a year or more if needed * Get a FLU shot once a year * Get a PNEUMONIA vaccine once before and once after age 61 years  GOALS:  * Your Hemoglobin A1c of <7%  * fasting sugars need to be <130 * after meals sugars need to be <180 (2h after you start eating) * Your Systolic BP should be 355 or lower  * Your Diastolic BP should be 80 or lower  * Your HDL (Good Cholesterol) should be 40 or higher  * Your LDL (Bad Cholesterol) should be 100 or lower. * Your Triglycerides should be 150 or lower  * Your Urine microalbumin (kidney function) should be <30 * Your Body Mass Index should be 25 or lower    Please consider the following ways to cut down carbs and fat and increase fiber and micronutrients in your diet: - substitute whole grain for white bread or pasta - substitute brown rice for white rice -  substitute 90-calorie flat bread pieces for slices of bread when possible - substitute sweet potatoes or yams for white potatoes - substitute humus for margarine - substitute tofu for cheese when possible - substitute almond or rice milk for regular milk (would not drink soy milk daily due to concern for soy estrogen influence on breast cancer risk) - substitute dark chocolate for other sweets when possible - substitute water - can add lemon or  orange slices for taste - for diet sodas (artificial sweeteners will trick your body that you can eat sweets without getting calories and will lead you to overeating and weight gain in the long run) - do not skip breakfast or other meals (this will slow down the metabolism and will result in more weight gain over time)  - can try smoothies made from fruit and almond/rice milk in am instead of regular breakfast - can also try old-fashioned (not instant) oatmeal made with almond/rice milk in am - order the dressing on the side when eating salad at a restaurant (pour less than half of the dressing on the salad) - eat as little meat as possible - can try juicing, but should not forget that juicing will get rid of the fiber, so would alternate with eating raw veg./fruits or drinking smoothies - use as little oil as possible, even when using olive oil - can dress a salad with a mix of balsamic vinegar and lemon juice, for e.g. - use agave nectar, stevia sugar, or regular sugar rather than artificial sweateners - steam or broil/roast veggies  - snack on veggies/fruit/nuts (unsalted, preferably) when possible, rather than processed foods - reduce or eliminate aspartame in diet (it is in diet sodas, chewing gum, etc) Read the labels!  Try to read Dr. Janene Harvey book: "Program for Reversing Diabetes" for other ideas for healthy eating.

## 2014-12-03 NOTE — Progress Notes (Signed)
Patient ID: Alexandria [name], female   DOB: 1935/11/07, 79 y.o.   MRN: [id number]  HPI: Alexandria [name] is a 79 y.o.-year-old female, referred by her PCP, Dr. [name], for management of DM2, dx in 2006, non-insulin-dependent, controlled, with complications (mild CKD). She is here with her daughter who offers part of the hx.  DM2: Last hemoglobin A1c was: Lab Results  Component Value Date   HGBA1C 6.1* 09/29/2014   HGBA1C 6.3 02/19/2010   HGBA1C 6.0 01/12/2007   Pt is not on any meds for her diabetes. She was on Januvia. She stopped Januvia this summer, when she was admitted for sepsis.   Pt checks her sugars 2-3 a day and they are: - am: 109-130 - 2h after b'fast: 110-130, 189 - before lunch:113-162 - 2h after lunch: 202 - before dinner: 103 - 2h after dinner: 130-166 - bedtime: 135 - nighttime: n/c No lows. Lowest sugar was 100; she has hypoglycemia awareness at 70.  Highest sugar was 260  - 1x a mo - not in last mo.  Glucometer:One Touch Ultra mini  Pt's meals are: - Breakfast: protein drink, egg, cereals - Lunch: sandwich - Dinner: meat + 2 veggies - Snacks: 1-3 peanut butter, milk, crackers  - + mild CKD, last BUN/creatinine:  Lab Results  Component Value Date   BUN 27* 10/18/2014   CREATININE 1.06 10/18/2014  On Losartan. - last set of lipids: Lab Results  Component Value Date   CHOL 91 09/29/2014   HDL 37* 09/29/2014   LDLCALC 40 09/29/2014   LDLDIRECT 45 02/19/2010   TRIG 71 09/29/2014   CHOLHDL 2.5 09/29/2014  On Zetia. - last eye exam was in 01/2014. No DR.  - no numbness and tingling in her feet.  She also has a h/o hypothyroidism.  She is on Synthroid (DAW) 150 mcg 6/7 days, 75 mcg 1/7 days - in am - fasting - with water - eats b'fast 1h later - no PPI - + iron at lunchtime - + calcium, MVI  Last TSH recently normal: Lab Results  Component Value Date   TSH 0.55 10/18/2014   She has a h/o COPD stage 2 - Dr Halford Chessman, HL, HTN, anemia,  GERD.  ROS: Constitutional: + weight gain, + fatigue, + hot flushes, + poor sleep, + nocturia Eyes: + blurry vision, no xerophthalmia ENT: no sore throat, + nodules palpated in throat, no dysphagia/odynophagia, no hoarseness, + tinnitus, + hypoacusis Cardiovascular: + CP/+ SOB/+ palpitations/+ leg swelling Respiratory: + cough/+ SOB/+ wheezing Gastrointestinal: + N/no V/D/+ C, + heartburn Musculoskeletal: + muscle aches/+ joint aches Skin: no rashes, + itching, + easy bruising, + hair loss Neurological: no tremors/numbness/tingling/dizziness, + HA Psychiatric: + depression/+ anxiety  Past Medical History  Diagnosis Date  . Depression 12/07/1998  . Hypothyroidism 03/08/1968  . Hypertension 03/08/1992  . Hyperlipidemia 10/04/2000  . Diabetes mellitus type II 02/05/2006    Dr. Elyse Hsu with endo  . COPD (chronic obstructive pulmonary disease) 03/08/1998    PFTs 12/12/2002 FEV 1 1.42 (64%) ratio 58 with no better after B2 and DLCO75%; PFTs 11/20/09 FEV1 1.50 (73%) ratio 50 no better after B2 with DLCO 62%; Hfa 50% 11/20/2009 >75%, 01/13/10 p coaching  . Ventral hernia   . Esophagitis   . Allergy, unspecified not elsewhere classified   . Disorders of bursae and tendons in shoulder region, unspecified     Rotator cuff syndrome, right  . Obesity     NOS  . Peripheral neuropathy  Likely due to DM per Dr. Erling Cruz  . OSA (obstructive sleep apnea)     PSG 01/27/10 AHI 13, pt does not know CPAP settings  . GERD (gastroesophageal reflux disease)   . Chronic diastolic congestive heart failure    Past Surgical History  Procedure Laterality Date  . Tonsillectomy    . Rotator cuff repair  1984    Right, [place]  . Cesarean section      x2 Breech/ repeat  . Total abdominal hysterectomy  1985    Due to dysmennorhea  . Cholecystectomy  1997  . Thumb release  12/1997    Right  . Carpal tunnel release  12/1997    Right  . Esophagogastroduodenoscopy  12/05/1997    Nml (due to  hoarseness)  . Abd u/s  03/19/1999    Nml x2 foci in liver  . Ct abd w & pelvis wo/w cm      Abd hemangiomas of liver, 1 cm R renal cyst  . Cardiolite persantine  08/24/2000    Nml  . Carotid u/s  08/24/2000    1-39% ICA stenosis  . Dexa  07/03/2003    Nml  . Adenosine myoview  06/02/2007    Nml  . Carotid u/s  06/02/2007    No apprec change   . US echocardiography  06/02/2007  . Hernia repair  01/24/2009    Lap Ventr w/ Lysis of adhesions (Dr. Donne Hazel)  . Knee arthroscopic surgery  years ago    right  . Shoulder open rotator cuff repair  02/08/2012    Procedure: ROTATOR CUFF REPAIR SHOULDER OPEN;  Surgeon: Magnus Sinning, MD;  Location: [facility];  Service: Orthopedics;  Laterality: Left;  Left Shoulder Open Anterior Acrominectomy Rotator Cuff Repair Open Distal Clavicle Resection ,tissue mend graft, and repair of biceps tendon  . Gallbladder surgery     Social History   Occupational History  . Retired - self employed- Engineer, structural    Social History Main Topics  . Smoking status: Former Smoker -- 2.00 packs/day for 50 years    Types: Cigarettes    Quit date: 2007  . Smokeless tobacco: Never Used  . Alcohol Use: 1.2 oz/week    2 Standard drinks or equivalent per week     Comment: occasional  . Drug Use: No  . Sexual Activity: Not on file   Social History Narrative   Married with 2 children   Enjoys painting   Current Outpatient Prescriptions on File Prior to Visit  Medication Sig Dispense Refill  . albuterol (PROAIR HFA) 108 (90 BASE) MCG/ACT inhaler Inhale 2 puffs into the lungs every 6 (six) hours as needed for wheezing or shortness of breath. 18 g 2  . budesonide-formoterol (SYMBICORT) 160-4.5 MCG/ACT inhaler INHALE 2 PUFFS FIRST THING IN THE MORNING AND 2 PUFFS AGAIN IN THE EVENING ABOUT 12 HOURS LATER 3 Inhaler 3  . Coenzyme Q-10 200 MG CAPS Take 1 capsule by mouth daily.      Marland Kitchen EPINEPHrine (EPIPEN) 0.3 mg/0.3 mL DEVI Inject 0.3 mg into the muscle as  directed.     . ergocalciferol (VITAMIN D2) 50000 UNITS capsule Take 50,000 Units by mouth every Friday.     . ezetimibe (ZETIA) 10 MG tablet Take 1 tablet (10 mg total) by mouth at bedtime.    . ferrous sulfate 325 (65 FE) MG tablet Take 325 mg by mouth daily with breakfast.    . FLUoxetine (PROZAC) 10 MG capsule TAKE 3 CAPSULES IN  THE MORNING AND TAKE 3 CAPSULES IN THE EVENING 360 capsule 1  . fluticasone (FLONASE) 50 MCG/ACT nasal spray USE 2 SPRAYS INTO THE NOSE EVERY 12 HOURS AS NEEDED FOR STUFFY NOSE 48 g 1  . ibuprofen (ADVIL,MOTRIN) 200 MG tablet Take 200-400 mg by mouth every 6 (six) hours as needed for moderate pain.     Marland Kitchen levothyroxine (SYNTHROID, LEVOTHROID) 150 MCG tablet Take 150 mcg by mouth daily before breakfast. Can not take generic. Must be synthroid    . LORazepam (ATIVAN) 0.5 MG tablet Take 1 tablet (0.5 mg total) by mouth 2 (two) times daily as needed. 30 tablet 0  . vitamin C (VITAMIN C) 500 MG tablet Take 1 tablet (500 mg total) by mouth 2 (two) times daily. 60 tablet 0  . ciprofloxacin (CIPRO) 250 MG tablet Take 1 tablet (250 mg total) by mouth 2 (two) times daily. (Patient not taking: Reported on 12/03/2014) 6 tablet 0  . famotidine (PEPCID) 20 MG tablet Take 20 mg by mouth at bedtime.     Marland Kitchen losartan (COZAAR) 25 MG tablet Take 25 mg by mouth daily.     No current facility-administered medications on file prior to visit.   Allergies  Allergen Reactions  . Clarithromycin     REACTION: diff swallowing and mouth blisters  . Codeine Other (See Comments)    Unknown   . Spiriva Handihaler [Tiotropium Bromide Monohydrate] Other (See Comments)    Voice changes  . Sulfonamide Derivatives     REACTION: closed throat   Family History  Problem Relation Age of Onset  . Heart disease Mother   . Thyroid disease Mother   . Emphysema Father     One lung  . Breast cancer Sister   . Cystic fibrosis Sister   . Hyperthyroidism Sister   . Esophageal cancer Neg Hx   . Cancer Neg  Hx     Head or neck  . Osteoarthritis Brother   . Hyperthyroidism Brother    PE: BP 132/72 mmHg  Pulse 86  Temp(Src) 97.9 F (36.6 C) (Oral)  Resp 12  Wt 224 lb (101.606 kg)  SpO2 94% Body mass index is 38.43 kg/(m^2).  Wt Readings from Last 3 Encounters:  12/03/14 224 lb (101.606 kg)  10/31/14 221 lb (100.245 kg)  10/18/14 219 lb 4 oz (99.451 kg)   Constitutional: overweight, in NAD Eyes: PERRLA, EOMI, no exophthalmos ENT: moist mucous membranes, no thyromegaly, no cervical lymphadenopathy Cardiovascular: RRR, No MRG Respiratory: CTA B Gastrointestinal: abdomen soft, NT, ND, BS+ Musculoskeletal: no deformities, strength intact in all 4 Skin: moist, warm, no rashes Neurological: + significant tremor with outstretched hands, DTR normal in all 4  ASSESSMENT: 1. DM2, non-insulin-dependent, controlled, with complications - mild CKD  2. Hypothyroidism  PLAN:  1. Patient with long-standing, controlled diabetes, off oral antidiabetic regimen (Januvia) after her last hospitalization, with good diabetes control. She only has CBG spikes when she eats sweets. We discussed about the need to avoid the concentrated sweets and given examples of healthy substitutions.  - reviewed latest HbA1c with her and her daughter >> at goal - We discussed about options for treatment, and I suggested to continue off Januvia for now - continue checking sugars at different times of the day - check 2 times a day, rotating checks - given sugar log and advised how to fill it and to bring it at next appt  - given foot care handout and explained the principles  - given instructions for hypoglycemia  management "15-15 rule"  - advised for yearly eye exams >> she is UTD - Return to clinic in 3 mo with sugar log   2. Hypothyroidism - will check TFT - last TSH was 0.55, lower than she and her daughter would prefer her to be closer to the ULN (I concur) - continue Synthroid 150 mcg 6/7 days, 75 mcg 1/7 days  for now - we discussed at length about how to take the thyroid hormone every day, with water, >30 minutes before breakfast, separated by >4 hours from acid reflux medications, calcium, iron, multivitamins.  Component     Latest Ref Rng 12/04/2014  TSH     0.35 - 4.50 uIU/mL 0.18 (L)  Free T4     0.60 - 1.60 ng/dL 1.25   Thyroid tests point towards over replacement with Synthroid. As of now, she is getting approximately 139 g of Synthroid daily. I will advise the patient to switch to 125 g daily. We'll recheck labs in 6 weeks.

## 2014-12-04 ENCOUNTER — Ambulatory Visit (INDEPENDENT_AMBULATORY_CARE_PROVIDER_SITE_OTHER): Payer: BLUE CROSS/BLUE SHIELD | Admitting: Cardiovascular Disease

## 2014-12-04 ENCOUNTER — Encounter: Payer: Self-pay | Admitting: Cardiovascular Disease

## 2014-12-04 ENCOUNTER — Other Ambulatory Visit (INDEPENDENT_AMBULATORY_CARE_PROVIDER_SITE_OTHER): Payer: BLUE CROSS/BLUE SHIELD

## 2014-12-04 VITALS — BP 120/64 | HR 79 | Ht 65.0 in | Wt 226.5 lb

## 2014-12-04 DIAGNOSIS — D509 Iron deficiency anemia, unspecified: Secondary | ICD-10-CM

## 2014-12-04 DIAGNOSIS — R0989 Other specified symptoms and signs involving the circulatory and respiratory systems: Secondary | ICD-10-CM | POA: Diagnosis not present

## 2014-12-04 DIAGNOSIS — E1140 Type 2 diabetes mellitus with diabetic neuropathy, unspecified: Secondary | ICD-10-CM

## 2014-12-04 DIAGNOSIS — R010 Benign and innocent cardiac murmurs: Secondary | ICD-10-CM | POA: Diagnosis not present

## 2014-12-04 DIAGNOSIS — R7881 Bacteremia: Secondary | ICD-10-CM

## 2014-12-04 DIAGNOSIS — R011 Cardiac murmur, unspecified: Secondary | ICD-10-CM

## 2014-12-04 DIAGNOSIS — I5032 Chronic diastolic (congestive) heart failure: Secondary | ICD-10-CM

## 2014-12-04 DIAGNOSIS — J441 Chronic obstructive pulmonary disease with (acute) exacerbation: Secondary | ICD-10-CM

## 2014-12-04 DIAGNOSIS — D649 Anemia, unspecified: Secondary | ICD-10-CM

## 2014-12-04 DIAGNOSIS — R0789 Other chest pain: Secondary | ICD-10-CM | POA: Insufficient documentation

## 2014-12-04 DIAGNOSIS — I10 Essential (primary) hypertension: Secondary | ICD-10-CM | POA: Diagnosis not present

## 2014-12-04 LAB — CBC WITH DIFFERENTIAL/PLATELET
Basophils Absolute: 0 10*3/uL (ref 0.0–0.1)
Basophils Relative: 0.3 % (ref 0.0–3.0)
Eosinophils Absolute: 0.2 10*3/uL (ref 0.0–0.7)
Eosinophils Relative: 2.6 % (ref 0.0–5.0)
HCT: 38.9 % (ref 36.0–46.0)
Hemoglobin: 12.8 g/dL (ref 12.0–15.0)
Lymphocytes Relative: 21.2 % (ref 12.0–46.0)
Lymphs Abs: 1.8 10*3/uL (ref 0.7–4.0)
MCHC: 32.8 g/dL (ref 30.0–36.0)
MCV: 85.8 fl (ref 78.0–100.0)
Monocytes Absolute: 0.8 10*3/uL (ref 0.1–1.0)
Monocytes Relative: 9.3 % (ref 3.0–12.0)
Neutro Abs: 5.7 10*3/uL (ref 1.4–7.7)
Neutrophils Relative %: 66.6 % (ref 43.0–77.0)
Platelets: 252 10*3/uL (ref 150.0–400.0)
RBC: 4.54 Mil/uL (ref 3.87–5.11)
RDW: 22.7 % — ABNORMAL HIGH (ref 11.5–15.5)
WBC: 8.6 10*3/uL (ref 4.0–10.5)

## 2014-12-04 LAB — IBC PANEL
Iron: 61 ug/dL (ref 42–145)
Saturation Ratios: 15.5 % — ABNORMAL LOW (ref 20.0–50.0)
Transferrin: 282 mg/dL (ref 212.0–360.0)

## 2014-12-04 LAB — T4, FREE: Free T4: 1.25 ng/dL (ref 0.60–1.60)

## 2014-12-04 LAB — TSH: TSH: 0.18 u[IU]/mL — ABNORMAL LOW (ref 0.35–4.50)

## 2014-12-04 MED ORDER — NITROGLYCERIN 0.4 MG SL SUBL: 0.4000 mg | SUBLINGUAL_TABLET | SUBLINGUAL | Status: DC | PRN

## 2014-12-04 MED ORDER — ROSUVASTATIN CALCIUM 10 MG PO TABS: 10.0000 mg | ORAL_TABLET | Freq: Every day | ORAL | Status: DC

## 2014-12-04 NOTE — Assessment & Plan Note (Signed)
Does not seem to be a active issue or even a chronic issue, more than acute issue in the setting her of her hospitalization She received significant IV fluids in the setting of sepsis, also in the setting of anemia had heart failure symptoms. Currently euvolemic, not on any diuretic. Anemia has improved No further workup or medication changes needed at this time

## 2014-12-04 NOTE — Patient Instructions (Addendum)
You are doing well.  For chest tightness with walking, Take albuterol, Check oxygen level If no relief, ok to take nitro and call the office  We will order a carotid ultrasound for bruit on the left side  For cramping, could be the crestor If symptoms get worse, you could hold crestor for a few weeks to see if cramps get better Call the office   Please call us if you have new issues that need to be addressed before your next appt.  Your physician wants you to follow-up in: 12 months.  You will receive a reminder letter in the mail two months in advance. If you don't receive a letter, please call our office to schedule the follow-up appointment.  Heart Failure Heart failure is a condition in which the heart has trouble pumping blood. This means your heart does not pump blood efficiently for your body to work well. In some cases of heart failure, fluid may back up into your lungs or you may have swelling (edema) in your lower legs. Heart failure is usually a long-term (chronic) condition. It is important for you to take good care of yourself and follow your health care provider's treatment plan. CAUSES  Some health conditions can cause heart failure. Those health conditions include:  High blood pressure (hypertension). Hypertension causes the heart muscle to work harder than normal. When pressure in the blood vessels is high, the heart needs to pump (contract) with more force in order to circulate blood throughout the body. High blood pressure eventually causes the heart to become stiff and weak.  Coronary artery disease (CAD). CAD is the buildup of cholesterol and fat (plaque) in the arteries of the heart. The blockage in the arteries deprives the heart muscle of oxygen and blood. This can cause chest pain and may lead to a heart attack. High blood pressure can also contribute to CAD.  Heart attack (myocardial infarction). A heart attack occurs when one or more arteries in the heart become  blocked. The loss of oxygen damages the muscle tissue of the heart. When this happens, part of the heart muscle dies. The injured tissue does not contract as well and weakens the heart's ability to pump blood.  Abnormal heart valves. When the heart valves do not open and close properly, it can cause heart failure. This makes the heart muscle pump harder to keep the blood flowing.  Heart muscle disease (cardiomyopathy or myocarditis). Heart muscle disease is damage to the heart muscle from a variety of causes. These can include drug or alcohol abuse, infections, or unknown reasons. These can increase the risk of heart failure.  Lung disease. Lung disease makes the heart work harder because the lungs do not work properly. This can cause a strain on the heart, leading it to fail.  Diabetes. Diabetes increases the risk of heart failure. High blood sugar contributes to high fat (lipid) levels in the blood. Diabetes can also cause slow damage to tiny blood vessels that carry important nutrients to the heart muscle. When the heart does not get enough oxygen and food, it can cause the heart to become weak and stiff. This leads to a heart that does not contract efficiently.  Other conditions can contribute to heart failure. These include abnormal heart rhythms, thyroid problems, and low blood counts (anemia). Certain unhealthy behaviors can increase the risk of heart failure, including:  Being overweight.  Smoking or chewing tobacco.  Eating foods high in fat and cholesterol.  Abusing illicit drugs  or alcohol.  Lacking physical activity. SYMPTOMS  Heart failure symptoms may vary and can be hard to detect. Symptoms may include:  Shortness of breath with activity, such as climbing stairs.  Persistent cough.  Swelling of the feet, ankles, legs, or abdomen.  Unexplained weight gain.  Difficulty breathing when lying flat (orthopnea).  Waking from sleep because of the need to sit up and get more  air.  Rapid heartbeat.  Fatigue and loss of energy.  Feeling light-headed, dizzy, or close to fainting.  Loss of appetite.  Nausea.  Increased urination during the night (nocturia). DIAGNOSIS  A diagnosis of heart failure is based on your history, symptoms, physical examination, and diagnostic tests. Diagnostic tests for heart failure may include:  Echocardiography.  Electrocardiography.  Chest X-ray.  Blood tests.  Exercise stress test.  Cardiac angiography.  Radionuclide scans. TREATMENT  Treatment is aimed at managing the symptoms of heart failure. Medicines, behavioral changes, or surgical intervention may be necessary to treat heart failure.  Medicines to help treat heart failure may include:  Angiotensin-converting enzyme (ACE) inhibitors. This type of medicine blocks the effects of a blood protein called angiotensin-converting enzyme. ACE inhibitors relax (dilate) the blood vessels and help lower blood pressure.  Angiotensin receptor blockers (ARBs). This type of medicine blocks the actions of a blood protein called angiotensin. Angiotensin receptor blockers dilate the blood vessels and help lower blood pressure.  Water pills (diuretics). Diuretics cause the kidneys to remove salt and water from the blood. The extra fluid is removed through urination. This loss of extra fluid lowers the volume of blood the heart pumps.  Beta blockers. These prevent the heart from beating too fast and improve heart muscle strength.  Digitalis. This increases the force of the heartbeat.  Healthy behavior changes include:  Obtaining and maintaining a healthy weight.  Stopping smoking or chewing tobacco.  Eating heart-healthy foods.  Limiting or avoiding alcohol.  Stopping illicit drug use.  Physical activity as directed by your health care provider.  Surgical treatment for heart failure may include:  A procedure to open blocked arteries, repair damaged heart valves, or  remove damaged heart muscle tissue.  A pacemaker to improve heart muscle function and control certain abnormal heart rhythms.  An internal cardioverter defibrillator to treat certain serious abnormal heart rhythms.  A left ventricular assist device (LVAD) to assist the pumping ability of the heart. HOME CARE INSTRUCTIONS   Take medicines only as directed by your health care provider. Medicines are important in reducing the workload of your heart, slowing the progression of heart failure, and improving your symptoms.  Do not stop taking your medicine unless directed by your health care provider.  Do not skip any dose of medicine.  Refill your prescriptions before you run out of medicine. Your medicines are needed every day.  Engage in moderate physical activity if directed by your health care provider. Moderate physical activity can benefit some people. The elderly and people with severe heart failure should consult with a health care provider for physical activity recommendations.  Eat heart-healthy foods. Food choices should be free of trans fat and low in saturated fat, cholesterol, and salt (sodium). Healthy choices include fresh or frozen fruits and vegetables, fish, lean meats, legumes, fat-free or low-fat dairy products, and whole grain or high fiber foods. Talk to a dietitian to learn more about heart-healthy foods.  Limit sodium if directed by your health care provider. Sodium restriction may reduce symptoms of heart failure in some people.  Talk to a dietitian to learn more about heart-healthy seasonings.  Use healthy cooking methods. Healthy cooking methods include roasting, grilling, broiling, baking, poaching, steaming, or stir-frying. Talk to a dietitian to learn more about healthy cooking methods.  Limit fluids if directed by your health care provider. Fluid restriction may reduce symptoms of heart failure in some people.  Weigh yourself every day. Daily weights are important  in the early recognition of excess fluid. You should weigh yourself every morning after you urinate and before you eat breakfast. Wear the same amount of clothing each time you weigh yourself. Record your daily weight. Provide your health care provider with your weight record.  Monitor and record your blood pressure if directed by your health care provider.  Check your pulse if directed by your health care provider.  Lose weight if directed by your health care provider. Weight loss may reduce symptoms of heart failure in some people.  Stop smoking or chewing tobacco. Nicotine makes your heart work harder by causing your blood vessels to constrict. Do not use nicotine gum or patches before talking to your health care provider.  Keep all follow-up visits as directed by your health care provider. This is important.  Limit alcohol intake to no more than 1 drink per day for nonpregnant women and 2 drinks per day for men. One drink equals 12 ounces of beer, 5 ounces of wine, or 1 ounces of hard liquor. Drinking more than that is harmful to your heart. Tell your health care provider if you drink alcohol several times a week. Talk with your health care provider about whether alcohol is safe for you. If your heart has already been damaged by alcohol or you have severe heart failure, drinking alcohol should be stopped completely.  Stop illicit drug use.  Stay up-to-date with immunizations. It is especially important to prevent respiratory infections through current pneumococcal and influenza immunizations.  Manage other health conditions such as hypertension, diabetes, thyroid disease, or abnormal heart rhythms as directed by your health care provider.  Learn to manage stress.  Plan rest periods when fatigued.  Learn strategies to manage high temperatures. If the weather is extremely hot:  Avoid vigorous physical activity.  Use air conditioning or fans or seek a cooler location.  Avoid caffeine  and alcohol.  Wear loose-fitting, lightweight, and light-colored clothing.  Learn strategies to manage cold temperatures. If the weather is extremely cold:  Avoid vigorous physical activity.  Layer clothes.  Wear mittens or gloves, a hat, and a scarf when going outside.  Avoid alcohol.  Obtain ongoing education and support as needed.  Participate in or seek rehabilitation as needed to maintain or improve independence and quality of life. SEEK MEDICAL CARE IF:   Your weight increases by 03 lb/1.4 kg in 1 day or 05 lb/2.3 kg in a week.  You have increasing shortness of breath that is unusual for you.  You are unable to participate in your usual physical activities.  You tire easily.  You cough more than normal, especially with physical activity.  You have any or more swelling in areas such as your hands, feet, ankles, or abdomen.  You are unable to sleep because it is hard to breathe.  You feel like your heart is beating fast (palpitations).  You become dizzy or light-headed upon standing up. SEEK IMMEDIATE MEDICAL CARE IF:   You have difficulty breathing.  There is a change in mental status such as decreased alertness or difficulty with concentration.  You have a pain or discomfort in your chest.  You have an episode of fainting (syncope). MAKE SURE YOU:   Understand these instructions.  Will watch your condition.  Will get help right away if you are not doing well or get worse. Document Released: 02/22/2005 Document Revised: 07/09/2013 Document Reviewed: 03/24/2012 [facility] Patient Information 2015 [place], [place]. This information is not intended to replace advice given to you by your health care provider. Make sure you discuss any questions you have with your health care provider.

## 2014-12-04 NOTE — Assessment & Plan Note (Signed)
Records reviewed, details of her hospitalization discussed with her. She feels she is still recovering, forgetful at times. Suspect it will take her some time to recover

## 2014-12-04 NOTE — Assessment & Plan Note (Signed)
COPD symptoms appear stable, managed by pulmonary. COPD likely contributing to her chest tightness. Recommended if she has chest tightness with exertion, she use her albuterol inhaler

## 2014-12-04 NOTE — Assessment & Plan Note (Signed)
She reports having some chest tightness and shortness of breath with heavy exertion. CT scan images reviewed with her showing mild aortic plaquing. Very mild coronary disease noted though this was not a full picture. Recommended if symptoms get worse that she call our office for stress testing. For now recommended she use her albuterol inhaler, check her ambulatory saturations. Nitroglycerin provided for her to take sublingual if symptoms do not resolve. By her account, symptoms have been relatively stable

## 2014-12-04 NOTE — Progress Notes (Signed)
Patient ID: Alexandria [name], female    DOB: 1936-01-06, 79 y.o.   MRN: [id number]  HPI Comments: 79 year old woman with obesity,  long history of smoking for 50 years, COPD, hyperlipidemia, hypertension,  previously seen in clinic in 2012 with chest pain, stress test at that time with no ischemia and normal echocardiogram, presenting to reestablish care after hospitalization July 2016 with COPD exacerbation, sepsis. History of sleep apnea, uses CPAP  She reports having Escherichia coli sepsis,  Review of the records showed she had  anemia with hematocrit down to 25, iron deficiency anemia Treated for COPD exacerbation CT scan of the abdomen shows mild descending aorta atherosclerosis  When she exerts herself too significant levels, she has chest tightness, shortness of breath, has to sit down until symptoms resolve. She attributes this to being overweight. This has been relatively stable, no progression in her symptoms. She continues to feel very weak following her hospitalization 2 months ago. Significant fatigue, periodic headaches, cramping in her legs  She reports that she quit smoking 7-10 years ago Hemoglobin A1c 6.1 She is taking Crestor and zetia.   EKG on today's visit shows normal sinus rhythm with rate 79 bpm, nonspecific ST and T wave abnormality in lead 1 and aVL  Allergies  Allergen Reactions  . Clarithromycin     REACTION: diff swallowing and mouth blisters  . Codeine Other (See Comments)    Unknown   . Spiriva Handihaler [Tiotropium Bromide Monohydrate] Other (See Comments)    Voice changes  . Sulfonamide Derivatives     REACTION: closed throat    Current Outpatient Prescriptions on File Prior to Visit  Medication Sig Dispense Refill  . albuterol (PROAIR HFA) 108 (90 BASE) MCG/ACT inhaler Inhale 2 puffs into the lungs every 6 (six) hours as needed for wheezing or shortness of breath. 18 g 2  . budesonide-formoterol (SYMBICORT) 160-4.5 MCG/ACT inhaler INHALE 2 PUFFS  FIRST THING IN THE MORNING AND 2 PUFFS AGAIN IN THE EVENING ABOUT 12 HOURS LATER 3 Inhaler 3  . Coenzyme Q-10 200 MG CAPS Take 1 capsule by mouth daily.      Marland Kitchen EPINEPHrine (EPIPEN) 0.3 mg/0.3 mL DEVI Inject 0.3 mg into the muscle as directed.     . ergocalciferol (VITAMIN D2) 50000 UNITS capsule Take 50,000 Units by mouth every Friday.     . ezetimibe (ZETIA) 10 MG tablet Take 1 tablet (10 mg total) by mouth at bedtime.    . famotidine (PEPCID) 20 MG tablet Take 20 mg by mouth at bedtime.     . ferrous sulfate 325 (65 FE) MG tablet Take 325 mg by mouth daily with breakfast.    . FLUoxetine (PROZAC) 10 MG capsule TAKE 3 CAPSULES IN THE MORNING AND TAKE 3 CAPSULES IN THE EVENING 360 capsule 1  . fluticasone (FLONASE) 50 MCG/ACT nasal spray USE 2 SPRAYS INTO THE NOSE EVERY 12 HOURS AS NEEDED FOR STUFFY NOSE 48 g 1  . ibuprofen (ADVIL,MOTRIN) 200 MG tablet Take 200-400 mg by mouth every 6 (six) hours as needed for moderate pain.     Marland Kitchen levothyroxine (SYNTHROID, LEVOTHROID) 150 MCG tablet Take 150 mcg by mouth daily before breakfast. Can not take generic. Must be synthroid    . LORazepam (ATIVAN) 0.5 MG tablet Take 1 tablet (0.5 mg total) by mouth 2 (two) times daily as needed. 30 tablet 0  . vitamin C (VITAMIN C) 500 MG tablet Take 1 tablet (500 mg total) by mouth 2 (two) times daily. [place]  tablet 0   No current facility-administered medications on file prior to visit.    Past Medical History  Diagnosis Date  . Depression 12/07/1998  . Hypothyroidism 03/08/1968  . Hypertension 03/08/1992  . Hyperlipidemia 10/04/2000  . Diabetes mellitus type II 02/05/2006    Dr. Elyse Hsu with endo  . COPD (chronic obstructive pulmonary disease) 03/08/1998    PFTs 12/12/2002 FEV 1 1.42 (64%) ratio 58 with no better after B2 and DLCO75%; PFTs 11/20/09 FEV1 1.50 (73%) ratio 50 no better after B2 with DLCO 62%; Hfa 50% 11/20/2009 >75%, 01/13/10 p coaching  . Ventral hernia   . Esophagitis   . Allergy, unspecified not  elsewhere classified   . Disorders of bursae and tendons in shoulder region, unspecified     Rotator cuff syndrome, right  . Obesity     NOS  . Peripheral neuropathy     Likely due to DM per Dr. Erling Cruz  . OSA (obstructive sleep apnea)     PSG 01/27/10 AHI 13, pt does not know CPAP settings  . GERD (gastroesophageal reflux disease)   . Chronic diastolic congestive heart failure   . Iron deficiency anemia   . E. coli bacteremia   . History of UTI     Past Surgical History  Procedure Laterality Date  . Tonsillectomy    . Rotator cuff repair  1984    Right, [place]  . Cesarean section      x2 Breech/ repeat  . Total abdominal hysterectomy  1985    Due to dysmennorhea  . Cholecystectomy  1997  . Thumb release  12/1997    Right  . Carpal tunnel release  12/1997    Right  . Esophagogastroduodenoscopy  12/05/1997    Nml (due to hoarseness)  . Abd u/s  03/19/1999    Nml x2 foci in liver  . Ct abd w & pelvis wo/w cm      Abd hemangiomas of liver, 1 cm R renal cyst  . Cardiolite persantine  08/24/2000    Nml  . Carotid u/s  08/24/2000    1-39% ICA stenosis  . Dexa  07/03/2003    Nml  . Adenosine myoview  06/02/2007    Nml  . Carotid u/s  06/02/2007    No apprec change   . US echocardiography  06/02/2007  . Hernia repair  01/24/2009    Lap Ventr w/ Lysis of adhesions (Dr. Donne Hazel)  . Knee arthroscopic surgery  years ago    right  . Shoulder open rotator cuff repair  02/08/2012    Procedure: ROTATOR CUFF REPAIR SHOULDER OPEN;  Surgeon: Magnus Sinning, MD;  Location: [facility];  Service: Orthopedics;  Laterality: Left;  Left Shoulder Open Anterior Acrominectomy Rotator Cuff Repair Open Distal Clavicle Resection ,tissue mend graft, and repair of biceps tendon  . Gallbladder surgery      Social History  reports that she quit smoking about 9 years ago. Her smoking use included Cigarettes. She has a 100 pack-year smoking history. She has never used smokeless tobacco. She  reports that she drinks about 1.2 oz of alcohol per week. She reports that she does not use illicit drugs.  Family History family history includes Breast cancer in her sister; Cystic fibrosis in her sister; Emphysema in her father; Heart disease in her mother; Hyperthyroidism in her brother and sister; Osteoarthritis in her brother; Thyroid disease in her mother. There is no history of Esophageal cancer or Cancer.   Review of Systems  Constitutional:  Negative.   Respiratory: Positive for shortness of breath.   Cardiovascular: Negative.   Gastrointestinal: Negative.   Musculoskeletal: Positive for myalgias.  Neurological: Negative.   Hematological: Negative.   Psychiatric/Behavioral: Negative.   All other systems reviewed and are negative.   BP 120/64 mmHg  Pulse 79  Ht '5\' 5"'$  (1.651 m)  Wt 226 lb 8 oz (102.74 kg)  BMI 37.69 kg/m2   Physical Exam  Constitutional: She is oriented to person, place, and time. She appears well-developed and well-nourished.  Obese  HENT:  Head: Normocephalic.  Nose: Nose normal.  Mouth/Throat: Oropharynx is clear and moist.  Eyes: Conjunctivae are normal. Pupils are equal, round, and reactive to light.  Neck: Normal range of motion. Neck supple. No JVD present.  Cardiovascular: Normal rate, regular rhythm, normal heart sounds and intact distal pulses.  Exam reveals no gallop and no friction rub.   No murmur heard. Pulmonary/Chest: Effort normal. No respiratory distress. She has decreased breath sounds. She has no wheezes. She has no rales. She exhibits no tenderness.  Abdominal: Soft. Bowel sounds are normal. She exhibits no distension. There is no tenderness.  Musculoskeletal: Normal range of motion. She exhibits no edema or tenderness.  Lymphadenopathy:    She has no cervical adenopathy.  Neurological: She is alert and oriented to person, place, and time. Coordination normal.  Skin: Skin is warm and dry. No rash noted. No erythema.  Psychiatric:  She has a normal mood and affect. Her behavior is normal. Judgment and thought content normal.

## 2014-12-04 NOTE — Assessment & Plan Note (Signed)
Blood pressure is well controlled on today's visit. No changes made to the medications. 

## 2014-12-04 NOTE — Assessment & Plan Note (Signed)
Limited in her ability to exercise. Recommended diet restriction  Followed by endocrine

## 2014-12-04 NOTE — Assessment & Plan Note (Signed)
Iron deficiency anemia, improved with supplemental iron. With improved hematocrit, breathing has improved

## 2014-12-05 ENCOUNTER — Encounter: Payer: Self-pay | Admitting: Pulmonary Disease

## 2014-12-05 ENCOUNTER — Ambulatory Visit (INDEPENDENT_AMBULATORY_CARE_PROVIDER_SITE_OTHER): Payer: BLUE CROSS/BLUE SHIELD | Admitting: Pulmonary Disease

## 2014-12-05 VITALS — BP 142/78 | HR 89 | Temp 98.0°F | Ht 65.0 in | Wt 224.8 lb

## 2014-12-05 DIAGNOSIS — G4733 Obstructive sleep apnea (adult) (pediatric): Secondary | ICD-10-CM

## 2014-12-05 DIAGNOSIS — Z23 Encounter for immunization: Secondary | ICD-10-CM

## 2014-12-05 DIAGNOSIS — J449 Chronic obstructive pulmonary disease, unspecified: Secondary | ICD-10-CM

## 2014-12-05 DIAGNOSIS — Z9989 Dependence on other enabling machines and devices: Secondary | ICD-10-CM

## 2014-12-05 MED ORDER — SYNTHROID 125 MCG PO TABS: 125.0000 ug | ORAL_TABLET | Freq: Every day | ORAL | Status: DC

## 2014-12-05 NOTE — Progress Notes (Signed)
Chief Complaint  Patient presents with  . Follow-up    pt states breathing is doing so much better. pt states she has increase in SOB when she over does activity. pt states he has minimum wheezing.  pt using CPAP most of the days for about 4 - 5 hours. mask and pressure good for pt. DME: [facility]. no dowlaod availabe     History of Present Illness: Alexandria [name] is a 79 y.o. female with dyspnea from COPD, diastolic CHF, deconditioning, and iron deficiency anemia.  She also has hx of OSA.  She was in hospital in July with AECOPD, E coli bacteremia with UTI, diastolic CHF, and anemia requiring transfusion.  CXR from 09/30/14 showed vascular congestion.  She has been feeling much better since hospital discharge.  She still gets winded with strenuous activity, but feels like her breathing is much better.  She is not having cough, wheeze, or chest congestion.  She has noticed swelling around her neck after being on steroids.  TESTS: PSG 01/27/10 >> AHI 13, SpO2 low 73% RAST 10/02/10 >> negative, IgE 8.1 A1AT 04/15/11 >> MM PFT 10/512 >> FEV1 1.29 (61%), FEV1% 57, TLC 6.01 (119%), RV 3.52 (153%), DLCO 67%, no BD ONO with CPAP 08/09/14 >> Test time 9 hr 16 min. Mean SpO2 92.2%, low SpO2 86%. Spent 10 min with SpO2 < 88%. PFT 08/15/14 >> FEV1 1.07 (57%), FEV1% 55, TLC 6.49 (128%), DLCO 47%, no BD Echo 09/28/14 >> mild LVH, EF 55 to 60%   PMhx >> Depression, Hypothyroidism, HTN, Diastolic CHF, HLD, DM, GERD, Peripheral neuropathy  Past surgical hx, Medications, Allergies, Family hx, Social hx all reviewed.   Physical Exam: BP 142/78 mmHg  Pulse 89  Temp(Src) 98 F (36.7 C) (Oral)  Ht '5\' 5"'$  (1.651 m)  Wt 224 lb 12.8 oz (101.969 kg)  BMI 37.41 kg/m2  SpO2 96%  General - No distress ENT - No sinus tenderness, no oral exudate, no LAN Cardiac - s1s2 regular, no murmur Chest - No wheeze/rales/dullness Back - No focal tenderness Abd - Soft, non-tender Ext - No edema Neuro - Normal strength Skin  - No rashes Psych - normal mood, and behavior   CMP Latest Ref Rng 10/18/2014 10/02/2014 10/01/2014  Glucose 70 - 99 mg/dL 87 95 166(H)  BUN 6 - 23 mg/dL 27(H) 30(H) 26(H)  Creatinine 0.40 - 1.20 mg/dL 1.06 1.22(H) 1.20(H)  Sodium 135 - 145 mEq/L 137 139 138  Potassium 3.5 - 5.1 mEq/L 5.1 4.3 4.5  Chloride 96 - 112 mEq/L 103 110 109  CO2 19 - 32 mEq/L '26 24 23  '$ Calcium 8.4 - 10.5 mg/dL 9.4 8.5(L) 8.5(L)  Total Protein 6.0 - 8.3 g/dL 7.3 - -  Total Bilirubin 0.2 - 1.2 mg/dL 0.6 - -  Alkaline Phos 39 - 117 U/L 90 - -  AST 0 - 37 U/L 24 - -  ALT 0 - 35 U/L 19 - -    CBC Latest Ref Rng 12/04/2014 10/31/2014 10/18/2014  WBC 4.0 - 10.5 K/uL 8.6 6.7 7.5  Hemoglobin 12.0 - 15.0 g/dL 12.8 11.5(L) 11.5(L)  Hematocrit 36.0 - 46.0 % 38.9 36.1 36.7  Platelets 150.0 - 400.0 K/uL 252.0 234.0 295.0      Assessment/Plan:  COPD. Plan: - continue symbicort and prn proair for now - influenza vaccine today  Obstructive sleep apnea. Plan: - continue CPAP  Obesity. Plan: - discussed importance of weight loss, and how her weight is impacting her breathing   Jahrell Hamor,  MD [facility] Pager:  [phone]

## 2014-12-05 NOTE — Patient Instructions (Signed)
Flu shot today Follow up in 6 months 

## 2014-12-06 ENCOUNTER — Telehealth: Payer: Self-pay

## 2014-12-06 NOTE — Telephone Encounter (Signed)
Spoke w/ Nunzio Cobbs.  Clarified nitro instructions w/ her.

## 2014-12-06 NOTE — Telephone Encounter (Signed)
Mail order pharmacy has a question regarding Nitro rx. Please call.

## 2014-12-08 ENCOUNTER — Other Ambulatory Visit: Payer: Self-pay | Admitting: Family Medicine

## 2014-12-08 DIAGNOSIS — D649 Anemia, unspecified: Secondary | ICD-10-CM

## 2014-12-09 ENCOUNTER — Telehealth: Payer: Self-pay | Admitting: *Deleted

## 2014-12-09 MED ORDER — NITROGLYCERIN 0.4 MG SL SUBL: 0.4000 mg | SUBLINGUAL_TABLET | SUBLINGUAL | Status: DC | PRN

## 2014-12-09 NOTE — Telephone Encounter (Signed)
°  1. Which medications need to be refilled? Nitro  2. Which pharmacy is medication to be sent to? [facility] [place]  3. Do they need a 30 day or 90 day supply? 30 day   4. Would they like a call back once the medication has been sent to the pharmacy? No

## 2014-12-09 NOTE — Telephone Encounter (Signed)
Refill sent for [employer]

## 2014-12-13 ENCOUNTER — Encounter: Payer: Self-pay | Admitting: Family Medicine

## 2014-12-13 ENCOUNTER — Ambulatory Visit (INDEPENDENT_AMBULATORY_CARE_PROVIDER_SITE_OTHER): Payer: BLUE CROSS/BLUE SHIELD | Admitting: Family Medicine

## 2014-12-13 VITALS — BP 126/72 | HR 88 | Temp 97.9°F | Wt 227.0 lb

## 2014-12-13 DIAGNOSIS — J441 Chronic obstructive pulmonary disease with (acute) exacerbation: Secondary | ICD-10-CM

## 2014-12-13 MED ORDER — DOXYCYCLINE HYCLATE 100 MG PO TABS: 100.0000 mg | ORAL_TABLET | Freq: Two times a day (BID) | ORAL | Status: DC

## 2014-12-13 NOTE — Assessment & Plan Note (Signed)
Presumed, d/w pt.  Restart SABA.  Start doxy.  Okay for outpatient f/u but ER cautions given. She agrees.  Update [place] as needed.  Nontoxic.

## 2014-12-13 NOTE — Progress Notes (Signed)
Pre visit review using our clinic review tool, if applicable. No additional management support is needed unless otherwise documented below in the visit note.  Last few days with a cough.  More chest congestion.  More shortness of breath.  More cough recently.  Sputum inc recently, whitish.  No fevers.  Some ST with a cough.  L ear pain.  No rhinorrhea.  No vomiting, no diarrhea but a few loose stools.  Some BLE edema.  She is still walking with a cane or walker.  She is on symbicort but hasn't used SABA recently.   Meds, vitals, and allergies reviewed.   ROS: See HPI.  Otherwise, noncontributory.  nad Speaking in complete sentences.   MMM OP wnl Neck supple, no LA rrr ctab but breathing slightly against pursed lips, a little more than normal no true inc in wob No focal dec in BS No wheeze abd soft Ext with trace BLE edema

## 2014-12-13 NOTE — Patient Instructions (Signed)
Presumed COPD exacerbation.  You don't sound like you have pneumonia.  Start doxycycline today.   Keep using symbicort.  Add on the albuterol every 6 hours as needed.  That should help.  Update [place] if needed.  Take care.

## 2014-12-24 ENCOUNTER — Ambulatory Visit (INDEPENDENT_AMBULATORY_CARE_PROVIDER_SITE_OTHER): Payer: BLUE CROSS/BLUE SHIELD | Admitting: Gastroenterology

## 2014-12-24 ENCOUNTER — Encounter: Payer: Self-pay | Admitting: Gastroenterology

## 2014-12-24 VITALS — BP 122/74 | HR 72 | Ht 64.0 in | Wt 223.1 lb

## 2014-12-24 DIAGNOSIS — D509 Iron deficiency anemia, unspecified: Secondary | ICD-10-CM | POA: Diagnosis not present

## 2014-12-24 NOTE — Progress Notes (Signed)
HPI: This is a   very pleasant 79 year old woman    who was referred to me by Tonia Ghent, MD  to evaluate  iron deficiency anemia .    Chief complaint is iron deficiency anemia  She was admitted to the hospital with a sepsis-like syndrome about 3 months ago. She spent 5 nights in the hospital. She was found to have Escherichia coli urosepsis. She was also found to have significant iron deficiency anemia that was Hemoccult negative. Her hemoglobin was as low as 7.0. Iron studies showed iron deficiency anemia, see below  Labs 09/2014: Hemoccult-negative stool Hemoglobin was 7.0 MCV 78, iron 6, ferritin 15, TIBC 300s Hemoglobin 9 2016 12.8.  She has had no overt GI bleeding and no other type of bleeding.  She stopped iron supplement 2 weeks ago, plan was to repeat CBC in 3-4 weeks to check her   She has never had colonoscopy  CRC not in family.   I reviewed his CT scan from March 2016 done for abdominal pain. It did suggest she had some thickening in her duodenum.  Review of systems: Pertinent positive and negative review of systems were noted in the above HPI section. Complete review of systems was performed and was otherwise normal.   Past Medical History  Diagnosis Date  . Depression 12/07/1998  . Hypothyroidism 03/08/1968  . Hypertension 03/08/1992  . Hyperlipidemia 10/04/2000  . Diabetes mellitus type II 02/05/2006    Dr. Elyse Hsu with endo  . COPD (chronic obstructive pulmonary disease) ([place]) 03/08/1998    PFTs 12/12/2002 FEV 1 1.42 (64%) ratio 58 with no better after B2 and DLCO75%; PFTs 11/20/09 FEV1 1.50 (73%) ratio 50 no better after B2 with DLCO 62%; Hfa 50% 11/20/2009 >75%, 01/13/10 p coaching  . Ventral hernia   . Esophagitis   . Allergy, unspecified not elsewhere classified   . Disorders of bursae and tendons in shoulder region, unspecified     Rotator cuff syndrome, right  . Obesity     NOS  . Peripheral neuropathy ([facility])     Likely due to DM per Dr. Erling Cruz   . OSA (obstructive sleep apnea)     PSG 01/27/10 AHI 13, pt does not know CPAP settings  . GERD (gastroesophageal reflux disease)   . Chronic diastolic congestive heart failure ([place])   . Iron deficiency anemia   . E. coli bacteremia   . History of UTI     Past Surgical History  Procedure Laterality Date  . Tonsillectomy    . Rotator cuff repair  1984    Right, [place]  . Cesarean section      x2 Breech/ repeat  . Total abdominal hysterectomy  1985    Due to dysmennorhea  . Cholecystectomy  1997  . Thumb release  12/1997    Right  . Carpal tunnel release  12/1997    Right  . Esophagogastroduodenoscopy  12/05/1997    Nml (due to hoarseness)  . Abd u/s  03/19/1999    Nml x2 foci in liver  . Ct abd w & pelvis wo/w cm      Abd hemangiomas of liver, 1 cm R renal cyst  . Cardiolite persantine  08/24/2000    Nml  . Carotid u/s  08/24/2000    1-39% ICA stenosis  . Dexa  07/03/2003    Nml  . Adenosine myoview  06/02/2007    Nml  . Carotid u/s  06/02/2007    No apprec change   .  US echocardiography  06/02/2007  . Hernia repair  01/24/2009    Lap Ventr w/ Lysis of adhesions (Dr. Donne Hazel)  . Knee arthroscopic surgery  years ago    right  . Shoulder open rotator cuff repair  02/08/2012    Procedure: ROTATOR CUFF REPAIR SHOULDER OPEN;  Surgeon: Magnus Sinning, MD;  Location: [facility];  Service: Orthopedics;  Laterality: Left;  Left Shoulder Open Anterior Acrominectomy Rotator Cuff Repair Open Distal Clavicle Resection ,tissue mend graft, and repair of biceps tendon  . Gallbladder surgery      Current Outpatient Prescriptions  Medication Sig Dispense Refill  . albuterol (PROAIR HFA) 108 (90 BASE) MCG/ACT inhaler Inhale 2 puffs into the lungs every 6 (six) hours as needed for wheezing or shortness of breath. 18 g 2  . budesonide-formoterol (SYMBICORT) 160-4.5 MCG/ACT inhaler INHALE 2 PUFFS FIRST THING IN THE MORNING AND 2 PUFFS AGAIN IN THE EVENING ABOUT 12 HOURS LATER 3  Inhaler 3  . Coenzyme Q-10 200 MG CAPS Take 1 capsule by mouth daily.      Marland Kitchen doxycycline (VIBRA-TABS) 100 MG tablet Take 1 tablet (100 mg total) by mouth 2 (two) times daily. 20 tablet 0  . EPINEPHrine (EPIPEN) 0.3 mg/0.3 mL DEVI Inject 0.3 mg into the muscle as directed.     . ergocalciferol (VITAMIN D2) 50000 UNITS capsule Take 50,000 Units by mouth every Friday.     . ezetimibe (ZETIA) 10 MG tablet Take 1 tablet (10 mg total) by mouth at bedtime.    . famotidine (PEPCID) 20 MG tablet Take 20 mg by mouth at bedtime.     . ferrous sulfate 325 (65 FE) MG tablet Take 325 mg by mouth daily with breakfast.    . FLUoxetine (PROZAC) 10 MG capsule TAKE 3 CAPSULES IN THE MORNING AND TAKE 3 CAPSULES IN THE EVENING 360 capsule 1  . fluticasone (FLONASE) 50 MCG/ACT nasal spray USE 2 SPRAYS INTO THE NOSE EVERY 12 HOURS AS NEEDED FOR STUFFY NOSE 48 g 1  . ibuprofen (ADVIL,MOTRIN) 200 MG tablet Take 200-400 mg by mouth every 6 (six) hours as needed for moderate pain.     Marland Kitchen LORazepam (ATIVAN) 0.5 MG tablet Take 1 tablet (0.5 mg total) by mouth 2 (two) times daily as needed. 30 tablet 0  . nitroGLYCERIN (NITROSTAT) 0.4 MG SL tablet Place 1 tablet (0.4 mg total) under the tongue every 5 (five) minutes as needed for chest pain. 25 tablet 3  . rosuvastatin (CRESTOR) 10 MG tablet Take 1 tablet (10 mg total) by mouth daily. 90 tablet 3  . SYNTHROID 125 MCG tablet Take 1 tablet (125 mcg total) by mouth daily before breakfast. 45 tablet 1  . valsartan (DIOVAN) 320 MG tablet Take 320 mg by mouth daily.    . vitamin C (VITAMIN C) 500 MG tablet Take 1 tablet (500 mg total) by mouth 2 (two) times daily. 60 tablet 0   No current facility-administered medications for this visit.    Allergies as of 12/24/2014 - Review Complete 12/24/2014  Allergen Reaction Noted  . Clarithromycin  05/23/2007  . Codeine Other (See Comments) 07/12/2006  . Spiriva handihaler [tiotropium bromide monohydrate] Other (See Comments) 06/13/2014   . Sulfonamide derivatives  07/12/2006    Family History  Problem Relation Age of Onset  . Heart disease Mother   . Thyroid disease Mother   . Emphysema Father     One lung  . Breast cancer Sister   . Cystic fibrosis Sister   .  Hyperthyroidism Sister   . Esophageal cancer Neg Hx   . Cancer Neg Hx     Head or neck  . Osteoarthritis Brother   . Hyperthyroidism Brother     Social History   Social History  . Marital Status: Married    Spouse Name: N/A  . Number of Children: N/A  . Years of Education: N/A   Occupational History  . Retired    Social History Main Topics  . Smoking status: Former Smoker -- 2.00 packs/day for 50 years    Types: Cigarettes    Quit date: 02/05/2005  . Smokeless tobacco: Never Used  . Alcohol Use: 1.2 oz/week    2 Standard drinks or equivalent per week     Comment: occasional  . Drug Use: No  . Sexual Activity: Not on file   Other Topics Concern  . Not on file   Social History Narrative   Married with 2 children   Enjoys painting     Physical Exam: BP 122/74 mmHg  Pulse 72  Ht '5\' 4"'$  (1.626 m)  Wt 223 lb 2 oz (101.209 kg)  BMI 38.28 kg/m2 Constitutional: Chronically ill-appearing, previous with slightly pursed lips, obese Psychiatric: alert and oriented x3 Eyes: extraocular movements intact Mouth: oral pharynx moist, no lesions Neck: supple no lymphadenopathy Cardiovascular: heart regular rate and rhythm Lungs: clear to auscultation bilaterally Abdomen: soft, nontender, nondistended, no obvious ascites, no peritoneal signs, normal bowel sounds Extremities: no lower extremity edema bilaterally Skin: no lesions on visible extremities   Assessment and plan: 79 y.o. female with  recent iron deficiency anemia  She has never had colon cancer screening that I can tell and I recommended we proceed with colonoscopy at her soonest convenience given her iron deficiency. Her hemoglobin was as low as 6 with a low MCV as well and iron  studies showing iron deficiency. I also explained to her that if I see no clear etiology of her iron deficiency on the colonoscopy I will proceed with upper endoscopy at the same time. She is not sure she wants to go through with this. I did explain to her that there could be a cancer in her colon and the sooner we diagnosis better. She wants to think about this and she will get back if she decides to go ahead with further testing.   Owens Loffler, MD [facility] 12/24/2014, 12:58 PM  Cc: Tonia Ghent, MD

## 2014-12-24 NOTE — Patient Instructions (Addendum)
You will be set up for a colonoscopy and EGD at [facility] with MAC sedation for recently noted significant iron def anemia.  Please call [phone] when you are ready to set this appointment up.

## 2014-12-26 ENCOUNTER — Ambulatory Visit (INDEPENDENT_AMBULATORY_CARE_PROVIDER_SITE_OTHER): Payer: BLUE CROSS/BLUE SHIELD

## 2014-12-26 DIAGNOSIS — I10 Essential (primary) hypertension: Secondary | ICD-10-CM

## 2014-12-26 DIAGNOSIS — R011 Cardiac murmur, unspecified: Secondary | ICD-10-CM

## 2014-12-26 DIAGNOSIS — R0989 Other specified symptoms and signs involving the circulatory and respiratory systems: Secondary | ICD-10-CM | POA: Diagnosis not present

## 2015-01-05 ENCOUNTER — Other Ambulatory Visit: Payer: Self-pay | Admitting: Family Medicine

## 2015-01-05 DIAGNOSIS — E538 Deficiency of other specified B group vitamins: Secondary | ICD-10-CM

## 2015-01-14 ENCOUNTER — Telehealth: Payer: Self-pay | Admitting: *Deleted

## 2015-01-14 NOTE — Telephone Encounter (Signed)
-----   Message from Pete Pelt, LPN sent at 09/10/1516  6:19 PM EDT ----- Left message on home number to call back. ----- Message -----    From: Tonia Ghent, MD    Sent: 01/05/2015   1:07 PM      To: Josetta Huddle, CMA  Call pt.  Due for f/u labs. Orders are in . Thanks.  Brigitte Pulse  ----- Message -----    From: Tonia Ghent, MD    Sent: 01/05/2015      To: Tonia Ghent, MD Subject: Need to order f/u B12 level for the future. #  Need to order f/u B12 level for the future.   Due this fall.

## 2015-01-14 NOTE — Telephone Encounter (Signed)
Patient says she was told after last labs:   Anemia resolved.  I would stop iron and recheck labs in about 5-6 weeks when she has the f/u thyroid tests done.  She says she will schedule lab appt at that time.  Can you be sure that the lab order includes the B12 and thyroid?

## 2015-01-15 LAB — HM DIABETES EYE EXAM

## 2015-01-16 NOTE — Telephone Encounter (Signed)
Orders are in.  I put in B12 and CBC/iron.  Gherghe put in the thyroid labs.  Thanks.

## 2015-01-24 ENCOUNTER — Other Ambulatory Visit (INDEPENDENT_AMBULATORY_CARE_PROVIDER_SITE_OTHER): Payer: BLUE CROSS/BLUE SHIELD

## 2015-01-24 DIAGNOSIS — E1140 Type 2 diabetes mellitus with diabetic neuropathy, unspecified: Secondary | ICD-10-CM | POA: Diagnosis not present

## 2015-01-24 DIAGNOSIS — E538 Deficiency of other specified B group vitamins: Secondary | ICD-10-CM | POA: Diagnosis not present

## 2015-01-24 DIAGNOSIS — D649 Anemia, unspecified: Secondary | ICD-10-CM | POA: Diagnosis not present

## 2015-01-24 LAB — CBC WITH DIFFERENTIAL/PLATELET
Basophils Absolute: 0 10*3/uL (ref 0.0–0.1)
Basophils Relative: 0.4 % (ref 0.0–3.0)
Eosinophils Absolute: 0.3 10*3/uL (ref 0.0–0.7)
Eosinophils Relative: 3.6 % (ref 0.0–5.0)
HCT: 40.8 % (ref 36.0–46.0)
Hemoglobin: 13.3 g/dL (ref 12.0–15.0)
Lymphocytes Relative: 26.8 % (ref 12.0–46.0)
Lymphs Abs: 2 10*3/uL (ref 0.7–4.0)
MCHC: 32.5 g/dL (ref 30.0–36.0)
MCV: 90.4 fl (ref 78.0–100.0)
Monocytes Absolute: 0.7 10*3/uL (ref 0.1–1.0)
Monocytes Relative: 8.9 % (ref 3.0–12.0)
Neutro Abs: 4.6 10*3/uL (ref 1.4–7.7)
Neutrophils Relative %: 60.3 % (ref 43.0–77.0)
Platelets: 243 10*3/uL (ref 150.0–400.0)
RBC: 4.52 Mil/uL (ref 3.87–5.11)
RDW: 14.6 % (ref 11.5–15.5)
WBC: 7.6 10*3/uL (ref 4.0–10.5)

## 2015-01-24 LAB — T4, FREE: Free T4: 0.56 ng/dL — ABNORMAL LOW (ref 0.60–1.60)

## 2015-01-24 LAB — TSH: TSH: 2.83 u[IU]/mL (ref 0.35–4.50)

## 2015-01-24 LAB — IBC PANEL
Iron: 59 ug/dL (ref 42–145)
Saturation Ratios: 15.1 % — ABNORMAL LOW (ref 20.0–50.0)
Transferrin: 280 mg/dL (ref 212.0–360.0)

## 2015-01-24 LAB — VITAMIN B12: Vitamin B-12: 1300 pg/mL — ABNORMAL HIGH (ref 211–911)

## 2015-01-27 ENCOUNTER — Other Ambulatory Visit: Payer: Self-pay | Admitting: *Deleted

## 2015-01-27 MED ORDER — SYNTHROID 125 MCG PO TABS: 125.0000 ug | ORAL_TABLET | Freq: Every day | ORAL | Status: DC

## 2015-01-28 ENCOUNTER — Telehealth: Payer: Self-pay | Admitting: *Deleted

## 2015-01-28 ENCOUNTER — Telehealth: Payer: Self-pay | Admitting: Family Medicine

## 2015-01-28 ENCOUNTER — Other Ambulatory Visit: Payer: Self-pay | Admitting: *Deleted

## 2015-01-28 NOTE — Telephone Encounter (Signed)
Patient is requesting #60, apparently is taking them twice daily rather than prn.  Last Filled: 30 tablet 0 10/03/2014  Please advise.

## 2015-01-28 NOTE — Telephone Encounter (Signed)
Patient is asking if she is to go back on her other medications, i.e. 81 mg aspirin?  She states she was told to "hold" the Januvia until she sees Endo in January.  Patient also states that she has some other questions that she will send via [place].

## 2015-01-28 NOTE — Telephone Encounter (Signed)
Spoke to patient

## 2015-01-28 NOTE — Telephone Encounter (Signed)
Patient returned Lugene's call. °

## 2015-01-29 MED ORDER — ASPIRIN EC 81 MG PO TBEC: 81.0000 mg | DELAYED_RELEASE_TABLET | Freq: Every day | ORAL | Status: DC

## 2015-01-29 MED ORDER — LORAZEPAM 0.5 MG PO TABS: 0.5000 mg | ORAL_TABLET | Freq: Two times a day (BID) | ORAL | Status: DC | PRN

## 2015-01-29 NOTE — Telephone Encounter (Signed)
Please call in 60.  Thanks.

## 2015-01-29 NOTE — Telephone Encounter (Signed)
Rx called to pharmacy as instructed. 

## 2015-01-29 NOTE — Telephone Encounter (Signed)
Please call in.  Thanks.   

## 2015-01-29 NOTE — Addendum Note (Signed)
Addended by: Tonia Ghent on: 01/29/2015 12:54 PM   Modules accepted: Orders

## 2015-01-29 NOTE — Telephone Encounter (Signed)
Patient notified as instructed by telephone and verbalized understanding. 

## 2015-01-29 NOTE — Telephone Encounter (Signed)
If told to hold januvia until endo f/u, then that is reasonable.  Okay to restart aspirin '81mg'$  a day if not bleeding.

## 2015-01-29 NOTE — Telephone Encounter (Signed)
Patient said prescription was called in for 15 days.  Patient needs prescription called in for 30 days #60.  Patient said after the 30 days she can get it for 3 months #180. Please call patient back when prescription is call in at [phone].

## 2015-01-29 NOTE — Telephone Encounter (Signed)
Script called to pharmacy as instructed. Patient notified by telephone.

## 2015-01-30 ENCOUNTER — Other Ambulatory Visit: Payer: Self-pay | Admitting: Family Medicine

## 2015-02-06 ENCOUNTER — Encounter: Payer: Self-pay | Admitting: Family Medicine

## 2015-02-07 DIAGNOSIS — Z1231 Encounter for screening mammogram for malignant neoplasm of breast: Secondary | ICD-10-CM | POA: Diagnosis not present

## 2015-02-10 ENCOUNTER — Encounter: Payer: Self-pay | Admitting: Family Medicine

## 2015-02-11 ENCOUNTER — Encounter: Payer: Self-pay | Admitting: *Deleted

## 2015-02-24 ENCOUNTER — Ambulatory Visit (INDEPENDENT_AMBULATORY_CARE_PROVIDER_SITE_OTHER): Payer: BLUE CROSS/BLUE SHIELD | Admitting: Primary Care

## 2015-02-24 ENCOUNTER — Encounter: Payer: Self-pay | Admitting: Primary Care

## 2015-02-24 ENCOUNTER — Other Ambulatory Visit: Payer: Self-pay | Admitting: Primary Care

## 2015-02-24 VITALS — BP 120/72 | HR 82 | Temp 97.4°F | Ht 64.0 in | Wt 230.8 lb

## 2015-02-24 DIAGNOSIS — R05 Cough: Secondary | ICD-10-CM | POA: Diagnosis not present

## 2015-02-24 DIAGNOSIS — R062 Wheezing: Secondary | ICD-10-CM | POA: Diagnosis not present

## 2015-02-24 DIAGNOSIS — L989 Disorder of the skin and subcutaneous tissue, unspecified: Secondary | ICD-10-CM

## 2015-02-24 DIAGNOSIS — R059 Cough, unspecified: Secondary | ICD-10-CM

## 2015-02-24 MED ORDER — ALBUTEROL SULFATE HFA 108 (90 BASE) MCG/ACT IN AERS: 2.0000 | INHALATION_SPRAY | Freq: Four times a day (QID) | RESPIRATORY_TRACT | Status: DC | PRN

## 2015-02-24 MED ORDER — DOXYCYCLINE HYCLATE 100 MG PO TABS: 100.0000 mg | ORAL_TABLET | Freq: Two times a day (BID) | ORAL | Status: DC

## 2015-02-24 NOTE — Patient Instructions (Signed)
Start Doxycycline antibiotic. Take 1 tablet by mouth twice daily for 7 days.  I've sent refills of your albuterol inhaler to your pharmacy. Please use this every 6 hours as needed for wheezing and shortness of breath.  Increase consumption of fluids and rest.  Cough: Robitussin DM  It was a pleasure meeting you!  Acute Bronchitis Bronchitis is inflammation of the airways that extend from the windpipe into the lungs (bronchi). The inflammation often causes mucus to develop. This leads to a cough, which is the most common symptom of bronchitis.  In acute bronchitis, the condition usually develops suddenly and goes away over time, usually in a couple weeks. Smoking, allergies, and asthma can make bronchitis worse. Repeated episodes of bronchitis may cause further lung problems.  CAUSES Acute bronchitis is most often caused by the same virus that causes a cold. The virus can spread from person to person (contagious) through coughing, sneezing, and touching contaminated objects. SIGNS AND SYMPTOMS   Cough.   Fever.   Coughing up mucus.   Body aches.   Chest congestion.   Chills.   Shortness of breath.   Sore throat.  DIAGNOSIS  Acute bronchitis is usually diagnosed through a physical exam. Your health care provider will also ask you questions about your medical history. Tests, such as chest X-rays, are sometimes done to rule out other conditions.  TREATMENT  Acute bronchitis usually goes away in a couple weeks. Oftentimes, no medical treatment is necessary. Medicines are sometimes given for relief of fever or cough. Antibiotic medicines are usually not needed but may be prescribed in certain situations. In some cases, an inhaler may be recommended to help reduce shortness of breath and control the cough. A cool mist vaporizer may also be used to help thin bronchial secretions and make it easier to clear the chest.  HOME CARE INSTRUCTIONS  Get plenty of rest.   Drink enough  fluids to keep your urine clear or pale yellow (unless you have a medical condition that requires fluid restriction). Increasing fluids may help thin your respiratory secretions (sputum) and reduce chest congestion, and it will prevent dehydration.   Take medicines only as directed by your health care provider.  If you were prescribed an antibiotic medicine, finish it all even if you start to feel better.  Avoid smoking and secondhand smoke. Exposure to cigarette smoke or irritating chemicals will make bronchitis worse. If you are a smoker, consider using nicotine gum or skin patches to help control withdrawal symptoms. Quitting smoking will help your lungs heal faster.   Reduce the chances of another bout of acute bronchitis by washing your hands frequently, avoiding people with cold symptoms, and trying not to touch your hands to your mouth, nose, or eyes.   Keep all follow-up visits as directed by your health care provider.  SEEK MEDICAL CARE IF: Your symptoms do not improve after 1 week of treatment.  SEEK IMMEDIATE MEDICAL CARE IF:  You develop an increased fever or chills.   You have chest pain.   You have severe shortness of breath.  You have bloody sputum.   You develop dehydration.  You faint or repeatedly feel like you are going to pass out.  You develop repeated vomiting.  You develop a severe headache. MAKE SURE YOU:   Understand these instructions.  Will watch your condition.  Will get help right away if you are not doing well or get worse.   This information is not intended to replace advice given  to you by your health care provider. Make sure you discuss any questions you have with your health care provider.   Document Released: 04/01/2004 Document Revised: 03/15/2014 Document Reviewed: 08/15/2012 Elsevier Interactive Patient Education [employer].

## 2015-02-24 NOTE — Progress Notes (Signed)
Pre visit review using our clinic review tool, if applicable. No additional management support is needed unless otherwise documented below in the visit note. 

## 2015-02-24 NOTE — Progress Notes (Signed)
Subjective:    Patient ID: Alexandria [name], female    DOB: 17-May-1935, 79 y.o.   MRN: [id number]  HPI  Alexandria [name] is a 79 year old female, with a history of CHF and COPD, who presents today with a chief complaint of cough. Her cough has been present for the past 10 days and is feeling more fatigued today. She also reports shortness of breath and wheezing. Her cough is productive with clear sputum. Denies fevers. She's not taken anything OTC for her symptoms. She is out of her albuterol inhaler and is requesting refills.   2) Skin Lesion: Located to the left side of her upper face near left eye for the past year. Her lesion will scab over and she will constantly pick at it. She  believes her lesion is getting worse as it has now formed a crater. Denies redness, drainage. She would like a referral to a dermatologist to have it removed.   Review of Systems  Constitutional: Positive for fatigue. Negative for fever and chills.  HENT: Positive for congestion. Negative for sinus pressure and sore throat.   Respiratory: Positive for cough, shortness of breath and wheezing.   Gastrointestinal: Negative for nausea.  Musculoskeletal: Negative for myalgias.       Past Medical History  Diagnosis Date  . Depression 12/07/1998  . Hypothyroidism 03/08/1968  . Hypertension 03/08/1992  . Hyperlipidemia 10/04/2000  . Diabetes mellitus type II 02/05/2006    Dr. Elyse Hsu with endo  . COPD (chronic obstructive pulmonary disease) ([place]) 03/08/1998    PFTs 12/12/2002 FEV 1 1.42 (64%) ratio 58 with no better after B2 and DLCO75%; PFTs 11/20/09 FEV1 1.50 (73%) ratio 50 no better after B2 with DLCO 62%; Hfa 50% 11/20/2009 >75%, 01/13/10 p coaching  . Ventral hernia   . Esophagitis   . Allergy, unspecified not elsewhere classified   . Disorders of bursae and tendons in shoulder region, unspecified     Rotator cuff syndrome, right  . Obesity     NOS  . Peripheral neuropathy (HCC)     Likely due to DM per Dr.  Erling Cruz  . OSA (obstructive sleep apnea)     PSG 01/27/10 AHI 13, pt does not know CPAP settings  . GERD (gastroesophageal reflux disease)   . Chronic diastolic congestive heart failure ([place])   . Iron deficiency anemia   . E. coli bacteremia   . History of UTI     Social History   Social History  . Marital Status: Married    Spouse Name: N/A  . Number of Children: N/A  . Years of Education: N/A   Occupational History  . Retired    Social History Main Topics  . Smoking status: Former Smoker -- 2.00 packs/day for 50 years    Types: Cigarettes    Quit date: 02/05/2005  . Smokeless tobacco: Never Used  . Alcohol Use: 1.2 oz/week    2 Standard drinks or equivalent per week     Comment: occasional  . Drug Use: No  . Sexual Activity: Not on file   Other Topics Concern  . Not on file   Social History Narrative   Married with 2 children   Enjoys painting    Past Surgical History  Procedure Laterality Date  . Tonsillectomy    . Rotator cuff repair  1984    Right, [place]  . Cesarean section      x2 Breech/ repeat  . Total abdominal hysterectomy  1985  Due to dysmennorhea  . Cholecystectomy  1997  . Thumb release  12/1997    Right  . Carpal tunnel release  12/1997    Right  . Esophagogastroduodenoscopy  12/05/1997    Nml (due to hoarseness)  . Abd u/s  03/19/1999    Nml x2 foci in liver  . Ct abd w & pelvis wo/w cm      Abd hemangiomas of liver, 1 cm R renal cyst  . Cardiolite persantine  08/24/2000    Nml  . Carotid u/s  08/24/2000    1-39% ICA stenosis  . Dexa  07/03/2003    Nml  . Adenosine myoview  06/02/2007    Nml  . Carotid u/s  06/02/2007    No apprec change   . US echocardiography  06/02/2007  . Hernia repair  01/24/2009    Lap Ventr w/ Lysis of adhesions (Dr. Donne Hazel)  . Knee arthroscopic surgery  years ago    right  . Shoulder open rotator cuff repair  02/08/2012    Procedure: ROTATOR CUFF REPAIR SHOULDER OPEN;  Surgeon: Magnus Sinning, MD;  Location: [facility];  Service: Orthopedics;  Laterality: Left;  Left Shoulder Open Anterior Acrominectomy Rotator Cuff Repair Open Distal Clavicle Resection ,tissue mend graft, and repair of biceps tendon  . Gallbladder surgery      Family History  Problem Relation Age of Onset  . Heart disease Mother   . Thyroid disease Mother   . Emphysema Father     One lung  . Breast cancer Sister   . Cystic fibrosis Sister   . Hyperthyroidism Sister   . Esophageal cancer Neg Hx   . Cancer Neg Hx     Head or neck  . Osteoarthritis Brother   . Hyperthyroidism Brother     Allergies  Allergen Reactions  . Clarithromycin     REACTION: diff swallowing and mouth blisters  . Codeine Other (See Comments)    Unknown   . Spiriva Handihaler [Tiotropium Bromide Monohydrate] Other (See Comments)    Voice changes  . Sulfonamide Derivatives     REACTION: closed throat    Current Outpatient Prescriptions on File Prior to Visit  Medication Sig Dispense Refill  . aspirin EC 81 MG tablet Take 1 tablet (81 mg total) by mouth daily.    . budesonide-formoterol (SYMBICORT) 160-4.5 MCG/ACT inhaler INHALE 2 PUFFS FIRST THING IN THE MORNING AND 2 PUFFS AGAIN IN THE EVENING ABOUT 12 HOURS LATER 3 Inhaler 3  . Coenzyme Q-10 200 MG CAPS Take 1 capsule by mouth daily.      Marland Kitchen EPINEPHrine (EPIPEN) 0.3 mg/0.3 mL DEVI Inject 0.3 mg into the muscle as directed.     . ergocalciferol (VITAMIN D2) 50000 UNITS capsule Take 50,000 Units by mouth every Friday.     . ezetimibe (ZETIA) 10 MG tablet Take 1 tablet (10 mg total) by mouth at bedtime.    . ferrous sulfate 325 (65 FE) MG tablet Take 325 mg by mouth daily with breakfast.    . FLUoxetine (PROZAC) 10 MG capsule TAKE 3 CAPSULES IN THE MORNING AND TAKE 3 CAPSULES IN THE EVENING 360 capsule 1  . fluticasone (FLONASE) 50 MCG/ACT nasal spray USE 2 SPRAYS INTO THE NOSE EVERY 12 HOURS AS NEEDED FOR STUFFY NOSE 48 g 1  . ibuprofen (ADVIL,MOTRIN) 200 MG tablet Take  200-400 mg by mouth every 6 (six) hours as needed for moderate pain.     Marland Kitchen LORazepam (ATIVAN) 0.5 MG tablet  Take 1 tablet (0.5 mg total) by mouth 2 (two) times daily as needed. 60 tablet 0  . nitroGLYCERIN (NITROSTAT) 0.4 MG SL tablet Place 1 tablet (0.4 mg total) under the tongue every 5 (five) minutes as needed for chest pain. 25 tablet 3  . rosuvastatin (CRESTOR) 10 MG tablet Take 1 tablet (10 mg total) by mouth daily. 90 tablet 3  . SYNTHROID 125 MCG tablet Take 1 tablet (125 mcg total) by mouth daily before breakfast. 90 tablet 1  . valsartan (DIOVAN) 320 MG tablet Take 320 mg by mouth daily.    . vitamin C (VITAMIN C) 500 MG tablet Take 1 tablet (500 mg total) by mouth 2 (two) times daily. 60 tablet 0   No current facility-administered medications on file prior to visit.    BP 120/72 mmHg  Pulse 82  Temp(Src) 97.4 F (36.3 C) (Oral)  Ht '5\' 4"'$  (1.626 m)  Wt 230 lb 12.8 oz (104.69 kg)  BMI 39.60 kg/m2  SpO2 96%    Objective:   Physical Exam  Constitutional: She appears well-nourished.  HENT:  Right Ear: Tympanic membrane and ear canal normal.  Left Ear: Tympanic membrane and ear canal normal.  Nose: Right sinus exhibits no maxillary sinus tenderness and no frontal sinus tenderness. Left sinus exhibits no maxillary sinus tenderness and no frontal sinus tenderness.  Mouth/Throat: Oropharynx is clear and moist.  Eyes: Conjunctivae are normal. Pupils are equal, round, and reactive to light.  Neck: Neck supple.  Cardiovascular: Normal rate and regular rhythm.   Pulmonary/Chest: Effort normal. She has no decreased breath sounds. She has wheezes in the right upper field and the left upper field. She has rhonchi in the right upper field, the right lower field, the left upper field and the left lower field.  Lymphadenopathy:    She has no cervical adenopathy.  Skin: Skin is warm and dry.          Assessment & Plan:  Acute Bronchitis:  Cough x 10 days, overall feeling worse.  Hx COPD. Exam with rhonchi throughout, mild wheezing to upper lobes. Due to presentation and duration, will treat with Doxycycline course. Refill provided for albuterol inhaler. Mucinex, flonase, fluids, rest. Return precautions provided.  Skin lesion:  Located to left lateral face, just distal to left eye x 1 year. Constat picking, now worse. Crater like lesion. No s/s of infection or cellulitis. Referral placed to dermatology for further evaluation.

## 2015-03-05 ENCOUNTER — Encounter: Payer: Self-pay | Admitting: Primary Care

## 2015-03-05 ENCOUNTER — Ambulatory Visit (INDEPENDENT_AMBULATORY_CARE_PROVIDER_SITE_OTHER): Payer: BLUE CROSS/BLUE SHIELD | Admitting: Primary Care

## 2015-03-05 VITALS — BP 114/60 | HR 82 | Temp 97.9°F | Wt 229.8 lb

## 2015-03-05 DIAGNOSIS — H1012 Acute atopic conjunctivitis, left eye: Secondary | ICD-10-CM

## 2015-03-05 MED ORDER — OLOPATADINE HCL 0.1 % OP SOLN: 1.0000 [drp] | Freq: Two times a day (BID) | OPHTHALMIC | Status: DC

## 2015-03-05 NOTE — Progress Notes (Signed)
Pre visit review using our clinic review tool, if applicable. No additional management support is needed unless otherwise documented below in the visit note. 

## 2015-03-05 NOTE — Patient Instructions (Signed)
Start olopatadine eye drops. Instill 1 drop into left eye twice daily for 7 days. Continue to use medication for 2 additional days once your symptoms have cleared.  Please notify me if you do not notice an improvement in your symptoms in 5 days.  It was a pleasure to see you today!  Allergic Conjunctivitis Allergic conjunctivitis is inflammation of the clear membrane that covers the white part of your eye and the inner surface of your eyelid (conjunctiva), and it is caused by allergies. The blood vessels in the conjunctiva become inflamed, and this causes the eye to become red or pink, and it often causes itchiness in the eye. Allergic conjunctivitis cannot be spread by one person to another person (noncontagious). CAUSES This condition is caused by an allergic reaction. Common causes of an allergic reaction (allergens) include:  Dust.  Pollen.  Mold.  Animal dander or secretions. RISK FACTORS This condition is more likely to develop if you are exposed to high levels of allergens that cause the allergic reaction. This might include being outdoors when air pollen levels are high or being around animals that you are allergic to. SYMPTOMS Symptoms of this condition may include:  Eye redness.  Tearing of the eyes.  Watery eyes.  Itchy eyes.  Burning feeling in the eyes.  Clear drainage from the eyes.  Swollen eyelids. DIAGNOSIS This condition may be diagnosed by medical history and physical exam. If you have drainage from your eyes, it may be tested to rule out other causes of conjunctivitis. TREATMENT Treatment for this condition often includes medicines. These may be eye drops, ointments, or oral medicines. They may be prescription medicines or over-the-counter medicines. HOME CARE INSTRUCTIONS  Take or apply medicines only as directed by your health care provider.  Do not touch or rub your eyes.  Do not wear contact lenses until the inflammation is gone. Wear glasses  instead.  Do not wear eye makeup until the inflammation is gone.  Apply a cool, clean washcloth to your eye for 10-20 minutes, 3-4 times a day.  Try to avoid whatever allergen is causing the allergic reaction. SEEK MEDICAL CARE IF:  Your symptoms get worse.  You have pus draining from your eye.  You have new symptoms.  You have a fever.   This information is not intended to replace advice given to you by your health care provider. Make sure you discuss any questions you have with your health care provider.   Document Released: 05/15/2002 Document Revised: 03/15/2014 Document Reviewed: 12/04/2013 Elsevier Interactive Patient Education [employer].

## 2015-03-05 NOTE — Progress Notes (Signed)
Subjective:    Patient ID: Alexandria [name], female    DOB: [birth year], 79 y.o.   MRN: [id number]  HPI  [name] is a 79 year old female who presents today with a chief complaint of left sided ear and eye pain. She was evaluated on 12/19 and treated for acute bronchitis with a course of Doxycycline. She's since completed that course and is feeling improved.  She noticed redness to her left eye on 02/20/15. She woke up Monday this week and felt dizziness, noticed clear, slimy, drainage, and ear pain. She also reports itching to her left eye. Denies fevers and sick contacts. She's not tried any remedies OTC.  Review of Systems  Constitutional: Negative for fever and chills.  HENT: Negative for sore throat.   Eyes: Positive for discharge, redness and itching. Negative for pain.  Respiratory: Negative for cough.        Past Medical History  Diagnosis Date  . Depression 12/07/1998  . Hypothyroidism 03/08/1968  . Hypertension 03/08/1992  . Hyperlipidemia 10/04/2000  . Diabetes mellitus type II 02/05/2006    Dr. Elyse Hsu with endo  . COPD (chronic obstructive pulmonary disease) ([place]) 03/08/1998    PFTs 12/12/2002 FEV 1 1.42 (64%) ratio 58 with no better after B2 and DLCO75%; PFTs 11/20/09 FEV1 1.50 (73%) ratio 50 no better after B2 with DLCO 62%; Hfa 50% 11/20/2009 >75%, 01/13/10 p coaching  . Ventral hernia   . Esophagitis   . Allergy, unspecified not elsewhere classified   . Disorders of bursae and tendons in shoulder region, unspecified     Rotator cuff syndrome, right  . Obesity     NOS  . Peripheral neuropathy (HCC)     Likely due to DM per Dr. Erling Cruz  . OSA (obstructive sleep apnea)     PSG 01/27/10 AHI 13, pt does not know CPAP settings  . GERD (gastroesophageal reflux disease)   . Chronic diastolic congestive heart failure ([place])   . Iron deficiency anemia   . E. coli bacteremia   . History of UTI     Social History   Social History  . Marital Status: Married    Spouse  Name: N/A  . Number of Children: N/A  . Years of Education: N/A   Occupational History  . Retired    Social History Main Topics  . Smoking status: Former Smoker -- 2.00 packs/day for 50 years    Types: Cigarettes    Quit date: 02/05/2005  . Smokeless tobacco: Never Used  . Alcohol Use: 1.2 oz/week    2 Standard drinks or equivalent per week     Comment: occasional  . Drug Use: No  . Sexual Activity: Not on file   Other Topics Concern  . Not on file   Social History Narrative   Married with 2 children   Enjoys painting    Past Surgical History  Procedure Laterality Date  . Tonsillectomy    . Rotator cuff repair  1984    Right, [place]  . Cesarean section      x2 Breech/ repeat  . Total abdominal hysterectomy  1985    Due to dysmennorhea  . Cholecystectomy  1997  . Thumb release  12/1997    Right  . Carpal tunnel release  12/1997    Right  . Esophagogastroduodenoscopy  12/05/1997    Nml (due to hoarseness)  . Abd u/s  03/19/1999    Nml x2 foci in liver  . Ct abd w &  pelvis wo/w cm      Abd hemangiomas of liver, 1 cm R renal cyst  . Cardiolite persantine  08/24/2000    Nml  . Carotid u/s  08/24/2000    1-39% ICA stenosis  . Dexa  07/03/2003    Nml  . Adenosine myoview  06/02/2007    Nml  . Carotid u/s  06/02/2007    No apprec change   . US echocardiography  06/02/2007  . Hernia repair  01/24/2009    Lap Ventr w/ Lysis of adhesions (Dr. Donne Hazel)  . Knee arthroscopic surgery  years ago    right  . Shoulder open rotator cuff repair  02/08/2012    Procedure: ROTATOR CUFF REPAIR SHOULDER OPEN;  Surgeon: Magnus Sinning, MD;  Location: [facility];  Service: Orthopedics;  Laterality: Left;  Left Shoulder Open Anterior Acrominectomy Rotator Cuff Repair Open Distal Clavicle Resection ,tissue mend graft, and repair of biceps tendon  . Gallbladder surgery      Family History  Problem Relation Age of Onset  . Heart disease Mother   . Thyroid disease Mother   .  Emphysema Father     One lung  . Breast cancer Sister   . Cystic fibrosis Sister   . Hyperthyroidism Sister   . Esophageal cancer Neg Hx   . Cancer Neg Hx     Head or neck  . Osteoarthritis Brother   . Hyperthyroidism Brother     Allergies  Allergen Reactions  . Clarithromycin     REACTION: diff swallowing and mouth blisters  . Codeine Other (See Comments)    Unknown   . Spiriva Handihaler [Tiotropium Bromide Monohydrate] Other (See Comments)    Voice changes  . Sulfonamide Derivatives     REACTION: closed throat    Current Outpatient Prescriptions on File Prior to Visit  Medication Sig Dispense Refill  . albuterol (PROAIR HFA) 108 (90 BASE) MCG/ACT inhaler Inhale 2 puffs into the lungs every 6 (six) hours as needed for wheezing or shortness of breath. 3 Inhaler 0  . aspirin EC 81 MG tablet Take 1 tablet (81 mg total) by mouth daily.    . budesonide-formoterol (SYMBICORT) 160-4.5 MCG/ACT inhaler INHALE 2 PUFFS FIRST THING IN THE MORNING AND 2 PUFFS AGAIN IN THE EVENING ABOUT 12 HOURS LATER 3 Inhaler 3  . Coenzyme Q-10 200 MG CAPS Take 1 capsule by mouth daily.      Marland Kitchen EPINEPHrine (EPIPEN) 0.3 mg/0.3 mL DEVI Inject 0.3 mg into the muscle as directed.     . ergocalciferol (VITAMIN D2) 50000 UNITS capsule Take 50,000 Units by mouth every Friday.     . ezetimibe (ZETIA) 10 MG tablet Take 1 tablet (10 mg total) by mouth at bedtime.    Marland Kitchen FLUoxetine (PROZAC) 10 MG capsule TAKE 3 CAPSULES IN THE MORNING AND TAKE 3 CAPSULES IN THE EVENING 360 capsule 1  . fluticasone (FLONASE) 50 MCG/ACT nasal spray USE 2 SPRAYS INTO THE NOSE EVERY 12 HOURS AS NEEDED FOR STUFFY NOSE 48 g 1  . ibuprofen (ADVIL,MOTRIN) 200 MG tablet Take 200-400 mg by mouth every 6 (six) hours as needed for moderate pain.     Marland Kitchen LORazepam (ATIVAN) 0.5 MG tablet Take 1 tablet (0.5 mg total) by mouth 2 (two) times daily as needed. 60 tablet 0  . nitroGLYCERIN (NITROSTAT) 0.4 MG SL tablet Place 1 tablet (0.4 mg total) under the  tongue every 5 (five) minutes as needed for chest pain. 25 tablet 3  . rosuvastatin (  CRESTOR) 10 MG tablet Take 1 tablet (10 mg total) by mouth daily. 90 tablet 3  . SYNTHROID 125 MCG tablet Take 1 tablet (125 mcg total) by mouth daily before breakfast. 90 tablet 1  . valsartan (DIOVAN) 320 MG tablet Take 320 mg by mouth daily.    . vitamin C (VITAMIN C) 500 MG tablet Take 1 tablet (500 mg total) by mouth 2 (two) times daily. 60 tablet 0   No current facility-administered medications on file prior to visit.    BP 114/60 mmHg  Pulse 82  Temp(Src) 97.9 F (36.6 C) (Oral)  Wt 229 lb 12 oz (104.214 kg)  SpO2 94%    Objective:   Physical Exam  Constitutional: She appears well-nourished.  HENT:  Right Ear: Tympanic membrane and ear canal normal.  Left Ear: Tympanic membrane and ear canal normal.  Nose: Right sinus exhibits no maxillary sinus tenderness and no frontal sinus tenderness. Left sinus exhibits no maxillary sinus tenderness and no frontal sinus tenderness.  Mouth/Throat: Oropharynx is clear and moist.  Eyes: Pupils are equal, round, and reactive to light. Right eye exhibits no discharge. Left eye exhibits discharge. Left eye exhibits no exudate. Right conjunctiva is not injected. Left conjunctiva is injected.  Cardiovascular: Normal rate and regular rhythm.   Pulmonary/Chest: Effort normal and breath sounds normal.          Assessment & Plan:  Allergic Conjunctivitis:  Erythema, itching, clear drainage from left eye.  No sick contacts, eyes not matted shut in AM. Exam with clear drainage to left eye, mild injection. No obvious signs for bacterial involvement, plus recently treated with antibiotics. Suspect allergy involvement and will treat with patanol gtts. RX for gtts sent to pharmacy with instructions. Will also have her try OTC allegra. Follow up PRN

## 2015-03-13 ENCOUNTER — Telehealth: Payer: Self-pay | Admitting: Internal Medicine

## 2015-03-13 ENCOUNTER — Telehealth: Payer: Self-pay | Admitting: *Deleted

## 2015-03-13 ENCOUNTER — Other Ambulatory Visit: Payer: Self-pay | Admitting: *Deleted

## 2015-03-13 DIAGNOSIS — E039 Hypothyroidism, unspecified: Secondary | ICD-10-CM

## 2015-03-13 DIAGNOSIS — E1149 Type 2 diabetes mellitus with other diabetic neurological complication: Secondary | ICD-10-CM

## 2015-03-13 NOTE — Telephone Encounter (Signed)
Pt is going for labs at [facility] AM can we please put in orders for her to have done there.

## 2015-03-13 NOTE — Telephone Encounter (Signed)
Opened encounter in error  

## 2015-03-13 NOTE — Telephone Encounter (Signed)
TSH, fT4 and HbA1c

## 2015-03-13 NOTE — Telephone Encounter (Signed)
Labs ordered.

## 2015-03-13 NOTE — Telephone Encounter (Signed)
Please read message below and advise which labs to order. Thanks you.

## 2015-03-14 ENCOUNTER — Telehealth: Payer: Self-pay | Admitting: Family Medicine

## 2015-03-14 ENCOUNTER — Ambulatory Visit: Payer: BLUE CROSS/BLUE SHIELD | Admitting: Family Medicine

## 2015-03-14 ENCOUNTER — Other Ambulatory Visit (INDEPENDENT_AMBULATORY_CARE_PROVIDER_SITE_OTHER): Payer: BLUE CROSS/BLUE SHIELD

## 2015-03-14 ENCOUNTER — Ambulatory Visit (INDEPENDENT_AMBULATORY_CARE_PROVIDER_SITE_OTHER): Payer: BLUE CROSS/BLUE SHIELD | Admitting: Family Medicine

## 2015-03-14 ENCOUNTER — Encounter: Payer: Self-pay | Admitting: Family Medicine

## 2015-03-14 VITALS — BP 122/74 | HR 82 | Temp 97.3°F | Ht 64.0 in | Wt 230.5 lb

## 2015-03-14 DIAGNOSIS — R51 Headache: Secondary | ICD-10-CM

## 2015-03-14 DIAGNOSIS — E1149 Type 2 diabetes mellitus with other diabetic neurological complication: Secondary | ICD-10-CM

## 2015-03-14 DIAGNOSIS — R42 Dizziness and giddiness: Secondary | ICD-10-CM | POA: Diagnosis not present

## 2015-03-14 DIAGNOSIS — E039 Hypothyroidism, unspecified: Secondary | ICD-10-CM

## 2015-03-14 DIAGNOSIS — H578 Other specified disorders of eye and adnexa: Secondary | ICD-10-CM

## 2015-03-14 DIAGNOSIS — H5789 Other specified disorders of eye and adnexa: Secondary | ICD-10-CM | POA: Insufficient documentation

## 2015-03-14 DIAGNOSIS — R519 Headache, unspecified: Secondary | ICD-10-CM

## 2015-03-14 LAB — T4, FREE: Free T4: 0.8 ng/dL (ref 0.60–1.60)

## 2015-03-14 LAB — CBC WITH DIFFERENTIAL/PLATELET
Basophils Absolute: 0 10*3/uL (ref 0.0–0.1)
Basophils Relative: 0.4 % (ref 0.0–3.0)
Eosinophils Absolute: 0.2 10*3/uL (ref 0.0–0.7)
Eosinophils Relative: 2.4 % (ref 0.0–5.0)
HCT: 38.8 % (ref 36.0–46.0)
Hemoglobin: 12.6 g/dL (ref 12.0–15.0)
Lymphocytes Relative: 25.7 % (ref 12.0–46.0)
Lymphs Abs: 1.8 10*3/uL (ref 0.7–4.0)
MCHC: 32.5 g/dL (ref 30.0–36.0)
MCV: 89.1 fl (ref 78.0–100.0)
Monocytes Absolute: 0.6 10*3/uL (ref 0.1–1.0)
Monocytes Relative: 8.5 % (ref 3.0–12.0)
Neutro Abs: 4.4 10*3/uL (ref 1.4–7.7)
Neutrophils Relative %: 63 % (ref 43.0–77.0)
Platelets: 253 10*3/uL (ref 150.0–400.0)
RBC: 4.35 Mil/uL (ref 3.87–5.11)
RDW: 14.4 % (ref 11.5–15.5)
WBC: 6.9 10*3/uL (ref 4.0–10.5)

## 2015-03-14 LAB — SEDIMENTATION RATE: Sed Rate: 29 mm/hr — ABNORMAL HIGH (ref 0–22)

## 2015-03-14 LAB — TSH: TSH: 1.75 u[IU]/mL (ref 0.35–4.50)

## 2015-03-14 LAB — HEMOGLOBIN A1C: Hgb A1c MFr Bld: 6.1 % (ref 4.6–6.5)

## 2015-03-14 NOTE — Patient Instructions (Signed)
Labs today for blood count and a sed rate ( for possible temporal arteritis) Make sure you are drinking enough water and fluids Try a warm compress on the left side of head  If suddenly develop severe eye pain or loss of vision -alert [place] and get to the ER   We will make a plan based on results

## 2015-03-14 NOTE — Progress Notes (Signed)
Subjective:    Patient ID: Alexandria [name], female    DOB: 1935-05-03, 80 y.o.   MRN: [id number]  HPI  Here with eye and ear problems   Was dx with allergic conjunctivitis  patanol drops are not helping- from [facility] Bossier    Had a biopsy on her face left - prior to that  Skin cancer   Still light headed  L ear is worse than the R - pressure and feels blocked   No nasal congestion  No colored nasal d/c  A little pain under her L cheek bone   Uses generic flonase daily - 2 sprays in each nostril   No sneeze or cough  Throat is ok also   Has a headache- continuous - L side  For 2 weeks   No hx of temporal arteritis   Patient Active Problem List   Diagnosis Date Noted  . Left temporal headache 03/14/2015  . Dizziness 03/14/2015  . Eye drainage 03/14/2015  . Chest tightness 12/04/2014  . Cystitis 11/03/2014  . Bacteremia, escherichia coli   . Chronic diastolic CHF (congestive heart failure) ([place])   . Iron deficiency anemia   . OSA on CPAP   . Obesity hypoventilation syndrome ([place])   . Chronic diastolic congestive heart failure ([place])   . Acute renal failure syndrome ([place])   . Absolute anemia   . Sepsis ([place]) 09/27/2014  . Routine general medical examination at a health care facility 06/14/2014  . Advance care planning 06/14/2014  . RLQ abdominal pain 05/10/2014  . COPD exacerbation ([place]) 04/28/2014  . Dyspnea 08/24/2012  . Shoulder pain 12/17/2011  . Abdominal hernia 12/17/2011  . Anxiety 09/23/2011  . Osteopenia 07/25/2011  . Obstructive sleep apnea 02/09/2010  . MURMUR 02/04/2010  . CHEST PAIN 01/13/2010  . ONYCHOMYCOSIS, TOENAILS 06/30/2009  . LEG PAIN 06/30/2009  . PALPITATIONS 08/29/2008  . HOARSENESS 01/08/2008  . ESOPHAGEAL STENOSIS 11/22/2007  . DYSPHAGIA 11/08/2007  . ESOPHAGITIS 10/11/2007  . EDEMA 07/03/2007  . COPD GOLD II 05/23/2007  . ALLERGY 07/25/2006  . Diabetes mellitus with neurological manifestation ([place]) 02/05/2006  . NUMBNESS  10/06/2001  . Hyperlipemia 10/04/2000  . DEPRESSION 03/08/1997  . Essential hypertension 03/08/1992  . Obesity 03/08/1989  . ROTATOR CUFF SYNDROME, RIGHT 03/08/1982  . HYPOTHYROIDISM 03/08/1968   Past Medical History  Diagnosis Date  . Depression 12/07/1998  . Hypothyroidism 03/08/1968  . Hypertension 03/08/1992  . Hyperlipidemia 10/04/2000  . Diabetes mellitus type II 02/05/2006    Dr. Elyse Hsu with endo  . COPD (chronic obstructive pulmonary disease) ([place]) 03/08/1998    PFTs 12/12/2002 FEV 1 1.42 (64%) ratio 58 with no better after B2 and DLCO75%; PFTs 11/20/09 FEV1 1.50 (73%) ratio 50 no better after B2 with DLCO 62%; Hfa 50% 11/20/2009 >75%, 01/13/10 p coaching  . Ventral hernia   . Esophagitis   . Allergy, unspecified not elsewhere classified   . Disorders of bursae and tendons in shoulder region, unspecified     Rotator cuff syndrome, right  . Obesity     NOS  . Peripheral neuropathy ([facility])     Likely due to DM per Dr. Erling Cruz  . OSA (obstructive sleep apnea)     PSG 01/27/10 AHI 13, pt does not know CPAP settings  . GERD (gastroesophageal reflux disease)   . Chronic diastolic congestive heart failure ([place])   . Iron deficiency anemia   . E. coli bacteremia   . History of UTI    Past Surgical History  Procedure Laterality Date  . Tonsillectomy    . Rotator cuff repair  1984    Right, [place]  . Cesarean section      x2 Breech/ repeat  . Total abdominal hysterectomy  1985    Due to dysmennorhea  . Cholecystectomy  1997  . Thumb release  12/1997    Right  . Carpal tunnel release  12/1997    Right  . Esophagogastroduodenoscopy  12/05/1997    Nml (due to hoarseness)  . Abd u/s  03/19/1999    Nml x2 foci in liver  . Ct abd w & pelvis wo/w cm      Abd hemangiomas of liver, 1 cm R renal cyst  . Cardiolite persantine  08/24/2000    Nml  . Carotid u/s  08/24/2000    1-39% ICA stenosis  . Dexa  07/03/2003    Nml  . Adenosine myoview  06/02/2007    Nml  . Carotid  u/s  06/02/2007    No apprec change   . US echocardiography  06/02/2007  . Hernia repair  01/24/2009    Lap Ventr w/ Lysis of adhesions (Dr. Donne Hazel)  . Knee arthroscopic surgery  years ago    right  . Shoulder open rotator cuff repair  02/08/2012    Procedure: ROTATOR CUFF REPAIR SHOULDER OPEN;  Surgeon: Magnus Sinning, MD;  Location: [facility];  Service: Orthopedics;  Laterality: Left;  Left Shoulder Open Anterior Acrominectomy Rotator Cuff Repair Open Distal Clavicle Resection ,tissue mend graft, and repair of biceps tendon  . Gallbladder surgery     Social History  Substance Use Topics  . Smoking status: Former Smoker -- 2.00 packs/day for 50 years    Types: Cigarettes    Quit date: 02/05/2005  . Smokeless tobacco: Never Used  . Alcohol Use: 1.2 oz/week    2 Standard drinks or equivalent per week     Comment: occasional   Family History  Problem Relation Age of Onset  . Heart disease Mother   . Thyroid disease Mother   . Emphysema Father     One lung  . Breast cancer Sister   . Cystic fibrosis Sister   . Hyperthyroidism Sister   . Esophageal cancer Neg Hx   . Cancer Neg Hx     Head or neck  . Osteoarthritis Brother   . Hyperthyroidism Brother    Allergies  Allergen Reactions  . Clarithromycin     REACTION: diff swallowing and mouth blisters  . Codeine Other (See Comments)    Unknown   . Spiriva Handihaler [Tiotropium Bromide Monohydrate] Other (See Comments)    Voice changes  . Sulfonamide Derivatives     REACTION: closed throat   Current Outpatient Prescriptions on File Prior to Visit  Medication Sig Dispense Refill  . albuterol (PROAIR HFA) 108 (90 BASE) MCG/ACT inhaler Inhale 2 puffs into the lungs every 6 (six) hours as needed for wheezing or shortness of breath. 3 Inhaler 0  . budesonide-formoterol (SYMBICORT) 160-4.5 MCG/ACT inhaler INHALE 2 PUFFS FIRST THING IN THE MORNING AND 2 PUFFS AGAIN IN THE EVENING ABOUT 12 HOURS LATER 3 Inhaler 3  . Coenzyme Q-10  200 MG CAPS Take 1 capsule by mouth daily.      Marland Kitchen EPINEPHrine (EPIPEN) 0.3 mg/0.3 mL DEVI Inject 0.3 mg into the muscle as directed.     . ergocalciferol (VITAMIN D2) 50000 UNITS capsule Take 50,000 Units by mouth every Friday.     . ezetimibe (ZETIA) 10 MG  tablet Take 1 tablet (10 mg total) by mouth at bedtime.    Marland Kitchen FLUoxetine (PROZAC) 10 MG capsule TAKE 3 CAPSULES IN THE MORNING AND TAKE 3 CAPSULES IN THE EVENING 360 capsule 1  . fluticasone (FLONASE) 50 MCG/ACT nasal spray USE 2 SPRAYS INTO THE NOSE EVERY 12 HOURS AS NEEDED FOR STUFFY NOSE 48 g 1  . ibuprofen (ADVIL,MOTRIN) 200 MG tablet Take 200-400 mg by mouth every 6 (six) hours as needed for moderate pain.     Marland Kitchen LORazepam (ATIVAN) 0.5 MG tablet Take 1 tablet (0.5 mg total) by mouth 2 (two) times daily as needed. 60 tablet 0  . nitroGLYCERIN (NITROSTAT) 0.4 MG SL tablet Place 1 tablet (0.4 mg total) under the tongue every 5 (five) minutes as needed for chest pain. 25 tablet 3  . olopatadine (PATANOL) 0.1 % ophthalmic solution Place 1 drop into the left eye 2 (two) times daily. 5 mL 0  . SYNTHROID 125 MCG tablet Take 1 tablet (125 mcg total) by mouth daily before breakfast. 90 tablet 1  . valsartan (DIOVAN) 320 MG tablet Take 320 mg by mouth daily.    . vitamin C (VITAMIN C) 500 MG tablet Take 1 tablet (500 mg total) by mouth 2 (two) times daily. 60 tablet 0  . aspirin EC 81 MG tablet Take 1 tablet (81 mg total) by mouth daily. (Patient not taking: Reported on 03/14/2015)    . rosuvastatin (CRESTOR) 10 MG tablet Take 1 tablet (10 mg total) by mouth daily. (Patient not taking: Reported on 03/14/2015) 90 tablet 3   No current facility-administered medications on file prior to visit.       Review of Systems Review of Systems  Constitutional: Negative for fever, appetite change, fatigue and unexpected weight change.  Eyes: Negative for pain and visual disturbance. Pos for watery/irritated L eye  ENT neg for cong or rhinorrhea / pos for L ear  full feeling   Respiratory: Negative for cough and shortness of breath.   Cardiovascular: Negative for cp or palpitations    Gastrointestinal: Negative for nausea, diarrhea and constipation.  Genitourinary: Negative for urgency and frequency.  Skin: Negative for pallor or rash   Neurological: Negative for weakness, light-, numbness and headaches.  Hematological: Negative for adenopathy. Does not bruise/bleed easily.  Psychiatric/Behavioral: Negative for dysphoric mood. The patient is not nervous/anxious.         Objective:   Physical Exam  Constitutional: She appears well-developed and well-nourished.  HENT:  Head: Normocephalic and atraumatic.  Right Ear: External ear normal.  Left Ear: External ear normal.  Mouth/Throat: Oropharynx is clear and moist. No oropharyngeal exudate.  Nares are boggy but clear  TMs clear  No external L ear pain  Some mild L temporal tenderness (post to her skin bx site) Slight tenderness of L maxillary sinus    Eyes: Conjunctivae and EOM are normal. Pupils are equal, round, and reactive to light. Right eye exhibits no discharge. Left eye exhibits no discharge. No scleral icterus.  No nystagmus slt watering of L eye -no cloudy or colored d/c  No eye swelling Vision grossly nl    Neck: Normal range of motion. Neck supple. No JVD present. No tracheal deviation present. No thyromegaly present.  Cardiovascular: Normal rate and regular rhythm.   Pulmonary/Chest: Effort normal and breath sounds normal. No respiratory distress. She has no wheezes. She has no rales.  Lymphadenopathy:    She has no cervical adenopathy.  Neurological: She is alert. She has normal  reflexes. She displays no atrophy and no tremor. No cranial nerve deficit or sensory deficit. She exhibits normal muscle tone. Coordination and gait normal.  Skin: Skin is warm and dry. No rash noted. No erythema. No pallor.  Psychiatric: She has a normal mood and affect.          Assessment &  Plan:   Problem List Items Addressed This Visit      Other   Dizziness    Intermittent lightheadedness - with L ear discomfort  Unsure if vertiginous  Fairly nl exam   ? Connection with temporal pain       Relevant Orders   CBC with Differential/Platelet (Completed)   Eye drainage    No improvement with patanol for all conjunctivitis Unsure if rel to temporal pain or ear discomfort  No classic sinus symptoms  Nl exam today       Left temporal headache - Primary    Check cbc and ESR today - want to r/u temporal arteritis  Considered sinusitis but symptoms are not classic  Also L eye watering - but no hx of migraine or cluster headache       Relevant Orders   Sedimentation Rate (Completed)   CBC with Differential/Platelet (Completed)

## 2015-03-14 NOTE — Progress Notes (Signed)
Pre visit review using our clinic review tool, if applicable. No additional management support is needed unless otherwise documented below in the visit note. 

## 2015-03-14 NOTE — Telephone Encounter (Signed)
Was seen by Dr. Glori Bickers in the meantime as she had a slot open up in her schedule.  App help from [employer].

## 2015-03-14 NOTE — Telephone Encounter (Signed)
PT CAME IN TODAY FOR LABS AND WANTED TO LET YOU KNOW THE EYE DROPS YOUR PRESCRIBED HER IS NOT HELPING. PT STATED AFTER SHE HAD THE SPOT ON SIDE OF FACE @ dr Tonia Brooms office she has had ear pain lighted and dizzy /she stated her eye problem started that day also

## 2015-03-14 NOTE — Telephone Encounter (Signed)
Spoke with pt; pt has continued with dizziness and lightheadedness, H/A, lt earache and pus draining from lt eye since 02/28/15. Pt has finished eye drops and still draining pus like liquid. Pt seen 03/05/15. Pt wants to know what to do.

## 2015-03-14 NOTE — Telephone Encounter (Signed)
Spoke with Allie Bossier NP and she is concerned about continuing dizziness and advised pt should be seen; [name] opened appt. Today at 11:15; pt advised [name] will see pt today but pt will be worked in and pt is appreciative.

## 2015-03-14 NOTE — Telephone Encounter (Signed)
Pt called and request c/b with lab results ordered from 03/14/15. She wants to know results asap. Allie Bossier stated to route call to Dr. Glori Bickers. Please advise.

## 2015-03-16 NOTE — Assessment & Plan Note (Signed)
Check cbc and ESR today - want to r/u temporal arteritis  Considered sinusitis but symptoms are not classic  Also L eye watering - but no hx of migraine or cluster headache  

## 2015-03-16 NOTE — Telephone Encounter (Signed)
I called her with results

## 2015-03-16 NOTE — Assessment & Plan Note (Signed)
No improvement with patanol for all conjunctivitis Unsure if rel to temporal pain or ear discomfort  No classic sinus symptoms  Nl exam today

## 2015-03-16 NOTE — Assessment & Plan Note (Signed)
Intermittent lightheadedness - with L ear discomfort  Unsure if vertiginous  Fairly nl exam   ? Connection with temporal pain

## 2015-03-17 ENCOUNTER — Telehealth: Payer: Self-pay | Admitting: Family Medicine

## 2015-03-17 MED ORDER — AMOXICILLIN-POT CLAVULANATE 875-125 MG PO TABS: 1.0000 | ORAL_TABLET | Freq: Two times a day (BID) | ORAL | Status: DC

## 2015-03-17 NOTE — Telephone Encounter (Signed)
Rx sent to pharmacy and pt notified of Dr. Marliss Coots comments/instructions and verbalized understanding

## 2015-03-17 NOTE — Telephone Encounter (Signed)
Please let pt know I ran this by [name] and I do want to cover her for a sinus infection to see if that helps  Please call in augmentin to her pharmacy of choice  Please update [place] re: how you feel after hat

## 2015-03-17 NOTE — Telephone Encounter (Signed)
-----  Message from Tonia Ghent, MD sent at 03/16/2015 10:21 PM EST ----- Dr. Glori Bickers- thanks for seeing patient.  Is it not unreasonable to treat for sinusitis?  That was my first thought after seeing the low ESR.

## 2015-03-17 NOTE — Telephone Encounter (Signed)
Left message with husband requesting pt to call office back

## 2015-03-17 NOTE — Telephone Encounter (Signed)
Pt returned call - please call back at [phone] Thank you

## 2015-03-18 ENCOUNTER — Ambulatory Visit: Payer: BLUE CROSS/BLUE SHIELD | Admitting: Internal Medicine

## 2015-03-26 ENCOUNTER — Other Ambulatory Visit: Payer: Self-pay

## 2015-03-26 NOTE — Telephone Encounter (Signed)
Pt left v/m requesting 90 day refill or # 180 of lorazepam to [facility]. Pt request refill done 03/26/15.last refilled # 60 on 01/29/15. Last f/u appt 10/18/14.Please advise.

## 2015-03-27 ENCOUNTER — Other Ambulatory Visit: Payer: Self-pay | Admitting: Family Medicine

## 2015-03-27 MED ORDER — LORAZEPAM 0.5 MG PO TABS: 0.5000 mg | ORAL_TABLET | Freq: Two times a day (BID) | ORAL | Status: DC | PRN

## 2015-03-27 NOTE — Telephone Encounter (Signed)
Medication phoned to pharmacy.  

## 2015-03-27 NOTE — Telephone Encounter (Signed)
See other refill request.

## 2015-03-27 NOTE — Telephone Encounter (Signed)
Electronic refill request. Last Filled:    60 tablet 0 01/29/2015  Please advise.

## 2015-03-27 NOTE — Telephone Encounter (Signed)
We need 24 hours on these.   Please call in.  Thanks.

## 2015-03-31 DIAGNOSIS — H04123 Dry eye syndrome of bilateral lacrimal glands: Secondary | ICD-10-CM | POA: Diagnosis not present

## 2015-04-04 ENCOUNTER — Ambulatory Visit (INDEPENDENT_AMBULATORY_CARE_PROVIDER_SITE_OTHER): Payer: BLUE CROSS/BLUE SHIELD | Admitting: Internal Medicine

## 2015-04-04 ENCOUNTER — Encounter: Payer: Self-pay | Admitting: Internal Medicine

## 2015-04-04 VITALS — BP 124/78 | HR 88 | Temp 97.3°F | Resp 12 | Wt 233.0 lb

## 2015-04-04 DIAGNOSIS — E039 Hypothyroidism, unspecified: Secondary | ICD-10-CM | POA: Diagnosis not present

## 2015-04-04 DIAGNOSIS — E1140 Type 2 diabetes mellitus with diabetic neuropathy, unspecified: Secondary | ICD-10-CM | POA: Diagnosis not present

## 2015-04-04 NOTE — Patient Instructions (Signed)
Please continue to take the thyroid hormone every day, with water, >30 minutes before breakfast, separated by >4 hours from acid reflux medications, calcium, iron, multivitamins.  Continue Synthroid 125 mcg daily.  Please come back for a follow-up appointment in 4 months.

## 2015-04-04 NOTE — Progress Notes (Signed)
Patient ID: Alexandria [name], female   DOB: [birth year], 80 y.o.   MRN: [id number]  HPI: Alexandria [name] is a 80 y.o.-year-old female, returning for follow-up for DM2, dx in 2006, non-insulin-dependent, controlled, with complications (mild CKD) and hypothyroidism. Last visit 4 months ago.  DM2: Last hemoglobin A1c was: Lab Results  Component Value Date   HGBA1C 6.1 03/14/2015   HGBA1C 6.1* 09/29/2014   HGBA1C 6.3 02/19/2010   Pt is not on any meds for her diabetes. She was on Januvia >> stopped in summer 2016, when she was admitted for sepsis.   Pt checks her sugars 2-3 a day and they are: - am: 109-130 >> 120, 137 - 2h after b'fast: 110-130, 189 >> n/c - before lunch:113-162 >> 125,  135 - 2h after lunch: 202 >> 112-156, 185 - before dinner: 103  - 2h after dinner: 130-166 >> 151, 157 - bedtime: 135 >> 121 - nighttime: n/c >> 169, 175 No lows. Lowest sugar was 100 >> 112; she has hypoglycemia awareness at 70.  Highest sugar was 260  - 1x a mo - not in last mo.  Glucometer:One Touch Ultra mini  Pt's meals are: - Breakfast: protein drink, egg, cereals - Lunch: sandwich - Dinner: meat + 2 veggies - Snacks: 1-3 peanut butter, milk, crackers  - + mild CKD, last BUN/creatinine:  Lab Results  Component Value Date   BUN 27* 10/18/2014   CREATININE 1.06 10/18/2014  On Losartan. - last set of lipids: Lab Results  Component Value Date   CHOL 91 09/29/2014   HDL 37* 09/29/2014   LDLCALC 40 09/29/2014   LDLDIRECT 45 02/19/2010   TRIG 71 09/29/2014   CHOLHDL 2.5 09/29/2014  On Zetia, Crestor  - on hold for 1 week.  - last eye exam was in 01/15/2015. No DR.  - no numbness and tingling in her feet.  She also has a h/o hypothyroidism.  She is on Synthroid (DAW) 125 g daily: - in am (~ 5 am) - fasting - with water - eats b'fast 3-4 later - no PPI - stopped iron - no calcium - no MVI  Last TSH recently normal: Lab Results  Component Value Date   TSH 1.75 03/14/2015    She has a h/o COPD stage 2 - Dr Halford Chessman, HL, HTN, anemia, GERD.  She was dx'ed with skin cancer - face >> will have this excised 05/03/2015.  ROS: Constitutional: + weight gain, + fatigue, no subjective hyperthermia/hypothermia Eyes: no blurry vision, no xerophthalmia ENT: no sore throat, no nodules palpated in throat, no dysphagia/odynophagia, no hoarseness Cardiovascular: no CP/+ SOB/no palpitations/+ leg swelling Respiratory: no cough/+ SOB Gastrointestinal: no N/V/D/C Musculoskeletal: no muscle/joint aches Skin: no rashes, + hair loss Neurological: no tremors/numbness/tingling/dizziness  I reviewed pt's medications, allergies, PMH, social hx, family hx, and changes were documented in the history of present illness. Otherwise, unchanged from my initial visit note.  Past Medical History  Diagnosis Date  . Depression 12/07/1998  . Hypothyroidism 03/08/1968  . Hypertension 03/08/1992  . Hyperlipidemia 10/04/2000  . Diabetes mellitus type II 02/05/2006    Dr. Elyse Hsu with endo  . COPD (chronic obstructive pulmonary disease) ([place]) 03/08/1998    PFTs 12/12/2002 FEV 1 1.42 (64%) ratio 58 with no better after B2 and DLCO75%; PFTs 11/20/09 FEV1 1.50 (73%) ratio 50 no better after B2 with DLCO 62%; Hfa 50% 11/20/2009 >75%, 01/13/10 p coaching  . Ventral hernia   . Esophagitis   . Allergy, unspecified not elsewhere  classified   . Disorders of bursae and tendons in shoulder region, unspecified     Rotator cuff syndrome, right  . Obesity     NOS  . Peripheral neuropathy (HCC)     Likely due to DM per Dr. Erling Cruz  . OSA (obstructive sleep apnea)     PSG 01/27/10 AHI 13, pt does not know CPAP settings  . GERD (gastroesophageal reflux disease)   . Chronic diastolic congestive heart failure ([place])   . Iron deficiency anemia   . E. coli bacteremia   . History of UTI    Past Surgical History  Procedure Laterality Date  . Tonsillectomy    . Rotator cuff repair  1984    Right, [place]  .  Cesarean section      x2 Breech/ repeat  . Total abdominal hysterectomy  1985    Due to dysmennorhea  . Cholecystectomy  1997  . Thumb release  12/1997    Right  . Carpal tunnel release  12/1997    Right  . Esophagogastroduodenoscopy  12/05/1997    Nml (due to hoarseness)  . Abd u/s  03/19/1999    Nml x2 foci in liver  . Ct abd w & pelvis wo/w cm      Abd hemangiomas of liver, 1 cm R renal cyst  . Cardiolite persantine  08/24/2000    Nml  . Carotid u/s  08/24/2000    1-39% ICA stenosis  . Dexa  07/03/2003    Nml  . Adenosine myoview  06/02/2007    Nml  . Carotid u/s  06/02/2007    No apprec change   . US echocardiography  06/02/2007  . Hernia repair  01/24/2009    Lap Ventr w/ Lysis of adhesions (Dr. Donne Hazel)  . Knee arthroscopic surgery  years ago    right  . Shoulder open rotator cuff repair  02/08/2012    Procedure: ROTATOR CUFF REPAIR SHOULDER OPEN;  Surgeon: Magnus Sinning, MD;  Location: [facility];  Service: Orthopedics;  Laterality: Left;  Left Shoulder Open Anterior Acrominectomy Rotator Cuff Repair Open Distal Clavicle Resection ,tissue mend graft, and repair of biceps tendon  . Gallbladder surgery     Social History   Occupational History  . Retired - self employed- Engineer, structural    Social History Main Topics  . Smoking status: Former Smoker -- 2.00 packs/day for 50 years    Types: Cigarettes    Quit date: 2007  . Smokeless tobacco: Never Used  . Alcohol Use: 1.2 oz/week    2 Standard drinks or equivalent per week     Comment: occasional  . Drug Use: No  . Sexual Activity: Not on file   Social History Narrative   Married with 2 children   Enjoys painting   Current Outpatient Prescriptions on File Prior to Visit  Medication Sig Dispense Refill  . albuterol (PROAIR HFA) 108 (90 BASE) MCG/ACT inhaler Inhale 2 puffs into the lungs every 6 (six) hours as needed for wheezing or shortness of breath. 3 Inhaler 0  . aspirin EC 81 MG tablet Take 1  tablet (81 mg total) by mouth daily. (Patient not taking: Reported on 03/14/2015)    . budesonide-formoterol (SYMBICORT) 160-4.5 MCG/ACT inhaler INHALE 2 PUFFS FIRST THING IN THE MORNING AND 2 PUFFS AGAIN IN THE EVENING ABOUT 12 HOURS LATER 3 Inhaler 3  . Coenzyme Q-10 200 MG CAPS Take 1 capsule by mouth daily.      Marland Kitchen EPINEPHrine (EPIPEN) 0.3  mg/0.3 mL DEVI Inject 0.3 mg into the muscle as directed.     . ergocalciferol (VITAMIN D2) 50000 UNITS capsule Take 50,000 Units by mouth every Friday.     . ezetimibe (ZETIA) 10 MG tablet Take 1 tablet (10 mg total) by mouth at bedtime.    Marland Kitchen FLUoxetine (PROZAC) 10 MG capsule TAKE 3 CAPSULES IN THE MORNING AND TAKE 3 CAPSULES IN THE EVENING 360 capsule 1  . fluticasone (FLONASE) 50 MCG/ACT nasal spray USE 2 SPRAYS INTO THE NOSE EVERY 12 HOURS AS NEEDED FOR STUFFY NOSE 48 g 1  . ibuprofen (ADVIL,MOTRIN) 200 MG tablet Take 200-400 mg by mouth every 6 (six) hours as needed for moderate pain.     Marland Kitchen LORazepam (ATIVAN) 0.5 MG tablet Take 1 tablet (0.5 mg total) by mouth 2 (two) times daily as needed. 180 tablet 0  . nitroGLYCERIN (NITROSTAT) 0.4 MG SL tablet Place 1 tablet (0.4 mg total) under the tongue every 5 (five) minutes as needed for chest pain. 25 tablet 3  . olopatadine (PATANOL) 0.1 % ophthalmic solution Place 1 drop into the left eye 2 (two) times daily. 5 mL 0  . rosuvastatin (CRESTOR) 10 MG tablet Take 1 tablet (10 mg total) by mouth daily. (Patient not taking: Reported on 03/14/2015) 90 tablet 3  . SYNTHROID 125 MCG tablet Take 1 tablet (125 mcg total) by mouth daily before breakfast. 90 tablet 1  . valsartan (DIOVAN) 320 MG tablet Take 320 mg by mouth daily.    . vitamin C (VITAMIN C) 500 MG tablet Take 1 tablet (500 mg total) by mouth 2 (two) times daily. 60 tablet 0   No current facility-administered medications on file prior to visit.   Allergies  Allergen Reactions  . Clarithromycin     REACTION: diff swallowing and mouth blisters  . Codeine  Other (See Comments)    Unknown   . Spiriva Handihaler [Tiotropium Bromide Monohydrate] Other (See Comments)    Voice changes  . Sulfonamide Derivatives     REACTION: closed throat   Family History  Problem Relation Age of Onset  . Heart disease Mother   . Thyroid disease Mother   . Emphysema Father     One lung  . Breast cancer Sister   . Cystic fibrosis Sister   . Hyperthyroidism Sister   . Esophageal cancer Neg Hx   . Cancer Neg Hx     Head or neck  . Osteoarthritis Brother   . Hyperthyroidism Brother    PE: BP 124/78 mmHg  Pulse 88  Temp(Src) 97.3 F (36.3 C) (Oral)  Resp 12  Wt 233 lb (105.688 kg)  SpO2 96% Body mass index is 39.97 kg/(m^2).  Wt Readings from Last 3 Encounters:  04/04/15 233 lb (105.688 kg)  03/14/15 230 lb 8 oz (104.554 kg)  03/05/15 229 lb 12 oz (104.214 kg)   Constitutional: overweight, in NAD Eyes: PERRLA, EOMI, no exophthalmos ENT: moist mucous membranes, no thyromegaly, no cervical lymphadenopathy Cardiovascular: RRR, No MRG Respiratory: CTA B Gastrointestinal: abdomen soft, NT, ND, BS+ Musculoskeletal: no deformities, strength intact in all 4 Skin: moist, warm, no rashes Neurological: + significant tremor with outstretched hands, DTR normal in all 4  ASSESSMENT: 1. DM2, non-insulin-dependent, controlled, with complications - mild CKD  2. Hypothyroidism  PLAN:  1. Patient with long-standing, controlled diabetes, off oral antidiabetic regimen (Januvia) after her sepsis hospitalization in 2016, with good diabetes control. She only has CBG spikes when she eats sweets. We again discussed about  the need to avoid concentrated sweets, but no need for medications right now - reviewed latest HbA1c with her >> 6.1%, which is great! - continue checking sugars at different times of the day - check once a day, rotating checks - advised for yearly eye exams >> she is UTD - Return to clinic in 4 mo with sugar log   2. Hypothyroidism - will  check TFTs at next visit - reviewed previous TFTs from earlier this month >> normal on this dose - continue Synthroid 125 mcg daily - advised her to take the thyroid hormone every day, with water, >30 minutes before breakfast, separated by >4 hours from acid reflux medications, calcium, iron, multivitamins. She is taking it correctly.

## 2015-04-23 ENCOUNTER — Other Ambulatory Visit: Payer: Self-pay | Admitting: *Deleted

## 2015-04-23 NOTE — Telephone Encounter (Signed)
I can not see whether this is being filled by PCP or cardiology.  BP last discussed at annual exam in April 2016.  Okay to refill at same strength?  Please advise.

## 2015-04-24 MED ORDER — VALSARTAN 320 MG PO TABS: 320.0000 mg | ORAL_TABLET | Freq: Every day | ORAL | Status: DC

## 2015-04-24 NOTE — Telephone Encounter (Signed)
Patient advised and will call back to schedule.

## 2015-04-24 NOTE — Telephone Encounter (Signed)
Sent.  Due for CPE in ~06/2015 or 07/2015

## 2015-04-28 ENCOUNTER — Encounter: Payer: Self-pay | Admitting: Family Medicine

## 2015-04-28 ENCOUNTER — Ambulatory Visit (INDEPENDENT_AMBULATORY_CARE_PROVIDER_SITE_OTHER): Payer: BLUE CROSS/BLUE SHIELD | Admitting: Family Medicine

## 2015-04-28 ENCOUNTER — Other Ambulatory Visit: Payer: Self-pay

## 2015-04-28 VITALS — BP 142/66 | HR 93 | Temp 98.0°F | Ht 64.0 in | Wt 234.5 lb

## 2015-04-28 DIAGNOSIS — L03116 Cellulitis of left lower limb: Secondary | ICD-10-CM | POA: Diagnosis not present

## 2015-04-28 MED ORDER — FLUOXETINE HCL 10 MG PO CAPS: ORAL_CAPSULE | ORAL | Status: DC

## 2015-04-28 MED ORDER — DOXYCYCLINE HYCLATE 100 MG PO TABS: 100.0000 mg | ORAL_TABLET | Freq: Two times a day (BID) | ORAL | Status: DC

## 2015-04-28 NOTE — Progress Notes (Signed)
Subjective:  Patient ID: Alexandria [name], female    DOB: [birth year]  Age: 80 y.o. MRN: [id number]  CC: Wound on leg, concern for infection  HPI:  80 year old female with a comp and get a past medical history including hypertension, DM 2 with complications, COPD presents to clinic today with the above complaints.  Wound, concern for infection (left leg)  Patient states that she fell and injured her knee and lower leg on February 8.  Patient states after the fall she was getting up and injured her left lower leg. She states that she scraped leg.  For the past several days, she's noticed redness around her wound.  No associated fevers or chills.  No exacerbating factors.  She's been applying peroxide with no improvement.  No other complaints today.  Social Hx   Social History   Social History  . Marital Status: Married    Spouse Name: N/A  . Number of Children: N/A  . Years of Education: N/A   Occupational History  . Retired    Social History Main Topics  . Smoking status: Former Smoker -- 2.00 packs/day for 50 years    Types: Cigarettes    Quit date: 02/05/2005  . Smokeless tobacco: Never Used  . Alcohol Use: 1.2 oz/week    2 Standard drinks or equivalent per week     Comment: occasional  . Drug Use: No  . Sexual Activity: Not Asked   Other Topics Concern  . None   Social History Narrative   Married with 2 children   Enjoys painting   Review of Systems  Constitutional: Negative for fever.  Musculoskeletal:       Left knee pain  Skin: Positive for wound.    Objective:  BP 142/66 mmHg  Pulse 93  Temp(Src) 98 F (36.7 C) (Oral)  Ht '5\' 4"'$  (1.626 m)  Wt 234 lb 8 oz (106.369 kg)  BMI 40.23 kg/m2  SpO2 94%  BP/Weight 04/28/2015 3/41/9379 0/04/4095  Systolic BP 353 299 242  Diastolic BP 66 78 74  Wt. (Lbs) 234.5 233 230.5  BMI 40.23 39.97 39.55   Physical Exam  Constitutional: She is oriented to person, place, and time. She appears well-developed.  No distress.  Pulmonary/Chest: Effort normal.  Neurological: She is alert and oriented to person, place, and time.  Skin:  Left lower leg - ~ 2 cm wound with eschar. Surrounding erythema and warmth noted. See picture below.   Psychiatric: She has a normal mood and affect.  Vitals reviewed.   Lab Results  Component Value Date   WBC 6.9 03/14/2015   HGB 12.6 03/14/2015   HCT 38.8 03/14/2015   PLT 253.0 03/14/2015   GLUCOSE 87 10/18/2014   CHOL 91 09/29/2014   TRIG 71 09/29/2014   HDL 37* 09/29/2014   LDLDIRECT 45 02/19/2010   LDLCALC 40 09/29/2014   ALT 19 10/18/2014   AST 24 10/18/2014   NA 137 10/18/2014   K 5.1 10/18/2014   CL 103 10/18/2014   CREATININE 1.06 10/18/2014   BUN 27* 10/18/2014   CO2 26 10/18/2014   TSH 1.75 03/14/2015   HGBA1C 6.1 03/14/2015   MICROALBUR 1.8 01/12/2007    Assessment & Plan:   Problem List Items Addressed This Visit    Cellulitis of leg, left - Primary    New problem. Exam consistent with cellulitis. Treating with doxycycline.  Recommend recheck later this week.          Meds ordered this encounter  Medications  . doxycycline (VIBRA-TABS) 100 MG tablet    Sig: Take 1 tablet (100 mg total) by mouth 2 (two) times daily.    Dispense:  20 tablet    Refill:  0    Follow-up: Later this week for recheck  [place]

## 2015-04-28 NOTE — Patient Instructions (Signed)
Take the medication as prescribed.  Follow up with Dr. [name] in the next week for re-evaluation.  Take care  Dr. Lacinda Axon

## 2015-04-28 NOTE — Telephone Encounter (Signed)
Pt request refill fluoxetine 10 mg pt taking 3 caps in AM and 3 caps in evening to [facility]; last filled # 360 x1 on 10/02/14 by Dr Peyton Najjar physician per pt). Pt does not think [name] has filled this med with these instructions before. Last saw [name] on 10/18/14 for hospital f/u.Please advise.

## 2015-04-28 NOTE — Assessment & Plan Note (Signed)
New problem. Exam consistent with cellulitis. Treating with doxycycline.  Recommend recheck later this week.

## 2015-04-28 NOTE — Telephone Encounter (Signed)
Sent. Due for CPE in ~06/2015 or 07/2015

## 2015-04-29 DIAGNOSIS — C44319 Basal cell carcinoma of skin of other parts of face: Secondary | ICD-10-CM | POA: Diagnosis not present

## 2015-04-29 NOTE — Telephone Encounter (Signed)
Patient advised on 04/24/15 and says she will call in to schedule.

## 2015-05-08 ENCOUNTER — Ambulatory Visit (INDEPENDENT_AMBULATORY_CARE_PROVIDER_SITE_OTHER): Payer: BLUE CROSS/BLUE SHIELD | Admitting: Family Medicine

## 2015-05-08 ENCOUNTER — Encounter: Payer: Self-pay | Admitting: Family Medicine

## 2015-05-08 VITALS — BP 116/62 | HR 93 | Temp 98.4°F | Wt 229.0 lb

## 2015-05-08 DIAGNOSIS — J441 Chronic obstructive pulmonary disease with (acute) exacerbation: Secondary | ICD-10-CM | POA: Diagnosis not present

## 2015-05-08 DIAGNOSIS — L03116 Cellulitis of left lower limb: Secondary | ICD-10-CM | POA: Diagnosis not present

## 2015-05-08 MED ORDER — PREDNISONE 20 MG PO TABS: ORAL_TABLET | ORAL | Status: DC

## 2015-05-08 MED ORDER — ROSUVASTATIN CALCIUM 10 MG PO TABS: 10.0000 mg | ORAL_TABLET | ORAL | Status: DC

## 2015-05-08 MED ORDER — DOXYCYCLINE HYCLATE 100 MG PO TABS: 100.0000 mg | ORAL_TABLET | Freq: Two times a day (BID) | ORAL | Status: DC

## 2015-05-08 NOTE — Assessment & Plan Note (Signed)
Okay for outpatient f/u.  Continue baseline inhalers, add on short course of pred, with food.  D/w pt about possible glucose elevation.  Should be able to tolerate.  Continue doxy for now.  She agrees.  Update me as needed.

## 2015-05-08 NOTE — Assessment & Plan Note (Signed)
Continue routine wound care, continue doxy for now per COPD exacerbation.  See above.

## 2015-05-08 NOTE — Patient Instructions (Addendum)
If your leg doesn't gradually heal over or if you have more pain then let me know.   Presumed COPD exacerbation, continue your inhalers.  Add on prednisone with food.  Extend the doxycycline.  Take care.  Glad to see you.

## 2015-05-08 NOTE — Progress Notes (Signed)
Pre visit review using our clinic review tool, if applicable. No additional management support is needed unless otherwise documented below in the visit note.  Cough worse in the last week.  Coming in fits. No fevers.  Some sputum, clear.  More than normal sputum but still clear.  Some wheeze, more than normal.  Her sugar has been controlled. Not on any DM2 med.   The cough is still getting worse, when it happens.  She is having more frequent coughing fits.   She is not SOB.    Still on doxy for L leg cellulitis, soon to finish rx.  No pain on the L shin.  Slowly healing.    Meds, vitals, and allergies reviewed.   ROS: See HPI.  Otherwise, noncontributory.  nad ncat Speaking in complete sentences Mmm OP wnl Neck supple, no LA rrr ctab except for occ scattered exp wheeze with prolonged exp phase abd soft Ext w/o edema Slowly healing superficial lesion on the L shin noted w/o spreading erythema.  She does have some pinkish tissue inferiorly, but this is minimal.

## 2015-06-18 ENCOUNTER — Ambulatory Visit: Payer: BLUE CROSS/BLUE SHIELD | Admitting: Pulmonary Disease

## 2015-06-24 ENCOUNTER — Ambulatory Visit: Payer: BLUE CROSS/BLUE SHIELD | Admitting: Pulmonary Disease

## 2015-07-23 ENCOUNTER — Other Ambulatory Visit: Payer: Self-pay | Admitting: Endocrinology

## 2015-07-23 ENCOUNTER — Other Ambulatory Visit: Payer: Self-pay | Admitting: Family Medicine

## 2015-07-24 NOTE — Telephone Encounter (Signed)
Electronic refill request. Last Filled:   Lorazepam  180 tablet 0 03/27/2015  Last Filled:   Vitamin D2 50,000 units   ? from Dr. Rockey Situ  Please advise.

## 2015-07-24 NOTE — Telephone Encounter (Signed)
Medication phoned to pharmacy. Patient advised.  

## 2015-07-24 NOTE — Telephone Encounter (Signed)
Please review

## 2015-07-24 NOTE — Telephone Encounter (Signed)
Due for f/u vit D level.  I declined that rx.  Can be done at OV with other labs as needed Due for f/u OV anyway, 30 min.   Please call in other rx.  Thanks.

## 2015-07-28 ENCOUNTER — Other Ambulatory Visit: Payer: Self-pay | Admitting: Family Medicine

## 2015-07-29 ENCOUNTER — Encounter: Payer: Self-pay | Admitting: Family Medicine

## 2015-07-29 ENCOUNTER — Ambulatory Visit (INDEPENDENT_AMBULATORY_CARE_PROVIDER_SITE_OTHER): Payer: BLUE CROSS/BLUE SHIELD | Admitting: Family Medicine

## 2015-07-29 VITALS — BP 112/68 | HR 83 | Temp 97.6°F | Ht 64.0 in | Wt 233.0 lb

## 2015-07-29 DIAGNOSIS — Z862 Personal history of diseases of the blood and blood-forming organs and certain disorders involving the immune mechanism: Secondary | ICD-10-CM

## 2015-07-29 DIAGNOSIS — I10 Essential (primary) hypertension: Secondary | ICD-10-CM

## 2015-07-29 DIAGNOSIS — E039 Hypothyroidism, unspecified: Secondary | ICD-10-CM

## 2015-07-29 DIAGNOSIS — F419 Anxiety disorder, unspecified: Secondary | ICD-10-CM

## 2015-07-29 DIAGNOSIS — E559 Vitamin D deficiency, unspecified: Secondary | ICD-10-CM

## 2015-07-29 DIAGNOSIS — R252 Cramp and spasm: Secondary | ICD-10-CM

## 2015-07-29 DIAGNOSIS — E1140 Type 2 diabetes mellitus with diabetic neuropathy, unspecified: Secondary | ICD-10-CM

## 2015-07-29 DIAGNOSIS — G4733 Obstructive sleep apnea (adult) (pediatric): Secondary | ICD-10-CM

## 2015-07-29 LAB — BASIC METABOLIC PANEL
BUN: 24 mg/dL — ABNORMAL HIGH (ref 6–23)
CO2: 29 mEq/L (ref 19–32)
Calcium: 9.7 mg/dL (ref 8.4–10.5)
Chloride: 106 mEq/L (ref 96–112)
Creatinine, Ser: 0.95 mg/dL (ref 0.40–1.20)
GFR: 60.48 mL/min (ref >60.00)
Glucose, Bld: 110 mg/dL — ABNORMAL HIGH (ref 70–99)
Potassium: 4.5 mEq/L (ref 3.5–5.1)
Sodium: 139 mEq/L (ref 135–145)

## 2015-07-29 LAB — CBC WITH DIFFERENTIAL/PLATELET
Basophils Absolute: 0 10*3/uL (ref 0.0–0.1)
Basophils Relative: 0.3 % (ref 0.0–3.0)
Eosinophils Absolute: 0.1 10*3/uL (ref 0.0–0.7)
Eosinophils Relative: 1.6 % (ref 0.0–5.0)
HCT: 35 % — ABNORMAL LOW (ref 36.0–46.0)
Hemoglobin: 11.3 g/dL — ABNORMAL LOW (ref 12.0–15.0)
Lymphocytes Relative: 19.8 % (ref 12.0–46.0)
Lymphs Abs: 1.6 10*3/uL (ref 0.7–4.0)
MCHC: 32.1 g/dL (ref 30.0–36.0)
MCV: 83.2 fl (ref 78.0–100.0)
Monocytes Absolute: 0.8 10*3/uL (ref 0.1–1.0)
Monocytes Relative: 9.6 % (ref 3.0–12.0)
Neutro Abs: 5.6 10*3/uL (ref 1.4–7.7)
Neutrophils Relative %: 68.7 % (ref 43.0–77.0)
Platelets: 277 10*3/uL (ref 150.0–400.0)
RBC: 4.21 Mil/uL (ref 3.87–5.11)
RDW: 16.1 % — ABNORMAL HIGH (ref 11.5–15.5)
WBC: 8.2 10*3/uL (ref 4.0–10.5)

## 2015-07-29 LAB — LDL CHOLESTEROL, DIRECT: Direct LDL: 63 mg/dL

## 2015-07-29 LAB — VITAMIN D 25 HYDROXY (VIT D DEFICIENCY, FRACTURES): VITD: 35.35 ng/mL (ref 30.00–100.00)

## 2015-07-29 LAB — CK: Total CK: 111 U/L (ref 7–177)

## 2015-07-29 MED ORDER — VALSARTAN 160 MG PO TABS: 160.0000 mg | ORAL_TABLET | Freq: Two times a day (BID) | ORAL | Status: DC

## 2015-07-29 NOTE — Progress Notes (Signed)
Pre visit review using our clinic review tool, if applicable. No additional management support is needed unless otherwise documented below in the visit note.  Recently with B leg cramps.  Was eventually able to walk it out.  4 episodes in total.  Recent episodes noted, she usually gets cramps a few times a week.    She had trouble swallowing '320mg'$  diovan due to the size of the pill.  Not having dysphagia o/w.  She was asking about changing to a smaller size pill.   She has followed up with endo re: her thyroid and DM2.    She is still seeing Dr. Halford Chessman with pulmonary.   D/w patient VO:UZHQUIQ for colon cancer screening, including IFOB vs. colonoscopy.  Risks and benefits of both were discussed and patient voiced understanding.  Pt elects to consider options for now.  She'll update me.   H/o depression with family stressors noted.  Her kids are not getting along with each other.  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  She is clearly able to function and not in distress at this point, no SI/HI.  Compliant with meds with no ADE on med.    Meds, vitals, and allergies reviewed.   ROS: Per HPI unless specifically indicated in ROS section   GEN: nad, alert and oriented HEENT: mucous membranes moist NECK: supple w/o LA CV: rrr.  PULM: ctab, no inc wob, no wheeze ABD: soft, +bs EXT: no edema SKIN: no acute rash

## 2015-07-29 NOTE — Patient Instructions (Signed)
Change to diovan '160mg'$  twice a day.  Go to the lab on the way out.  We'll contact you with your lab report. Take care.  Glad to see you.

## 2015-07-31 ENCOUNTER — Ambulatory Visit: Payer: BLUE CROSS/BLUE SHIELD | Admitting: Internal Medicine

## 2015-07-31 NOTE — Assessment & Plan Note (Signed)
She doesn't have dysphagia except for a really large pill.  D/w pt.  Okay to change to valsartan '160mg'$ , taking 2 a day.  Update me as needed o/w.  She agrees.  >25 minutes spent in face to face time with patient, >50% spent in counselling or coordination of care

## 2015-07-31 NOTE — Assessment & Plan Note (Signed)
Per endo °

## 2015-07-31 NOTE — Assessment & Plan Note (Signed)
Per pulm 

## 2015-07-31 NOTE — Assessment & Plan Note (Signed)
And depression.  Reasonably well controlled with current meds.  Okay for outpatient f/u.  I wouldn't change meds at this point.  She is trying to work through her family situation as best she can.

## 2015-08-15 ENCOUNTER — Other Ambulatory Visit: Payer: Self-pay | Admitting: Family Medicine

## 2015-10-23 ENCOUNTER — Other Ambulatory Visit: Payer: Self-pay | Admitting: Family Medicine

## 2015-10-23 DIAGNOSIS — R062 Wheezing: Secondary | ICD-10-CM

## 2015-10-23 NOTE — Telephone Encounter (Signed)
Sent. Thanks.   

## 2015-10-28 ENCOUNTER — Other Ambulatory Visit: Payer: Self-pay | Admitting: Family Medicine

## 2015-10-28 NOTE — Telephone Encounter (Signed)
Electronic refill request. Last Filled:     180 tablet 0 07/24/2015  Last office visit:   07/29/15  Please advise.

## 2015-10-29 NOTE — Telephone Encounter (Signed)
Please call in.  Thanks.   

## 2015-10-29 NOTE — Telephone Encounter (Signed)
Rx called to pharmacy as instructed. 

## 2015-11-13 ENCOUNTER — Other Ambulatory Visit: Payer: Self-pay | Admitting: Family Medicine

## 2015-11-13 NOTE — Telephone Encounter (Signed)
Pt left v/m requesting refill crestor to [facility]; Dr Rockey Situ had previously filled but pt wants [name] to take over refills. Pt last seen 07/29/15.

## 2015-11-14 MED ORDER — ROSUVASTATIN CALCIUM 10 MG PO TABS: 10.0000 mg | ORAL_TABLET | ORAL | 1 refills | Status: DC

## 2015-11-14 NOTE — Telephone Encounter (Signed)
Sent. Needs 30 min OV this fall. Thanks.

## 2015-11-14 NOTE — Telephone Encounter (Signed)
Pt called about question about quantity of prozac and that was addressed in another phone note but I offered to schedule 30 min OV this fall for pt and she said she had 6 appts to make this fall and she would have to cb to schedule appt.

## 2015-11-14 NOTE — Telephone Encounter (Signed)
Pt left v/m wanting 90 day supply for prozac; pt was given # 360 instead of # 540. [name] said ok to change quantity to # 540; I called [facility] and spoke with [facility] and she said since pt had already picked up the # 360 could not add on to that rx. Pt voiced understanding and will get updated when she comes in for her next appt.

## 2015-11-26 ENCOUNTER — Other Ambulatory Visit: Payer: Self-pay

## 2015-11-26 ENCOUNTER — Telehealth: Payer: Self-pay

## 2015-11-26 ENCOUNTER — Telehealth: Payer: Self-pay | Admitting: Internal Medicine

## 2015-11-26 DIAGNOSIS — E119 Type 2 diabetes mellitus without complications: Secondary | ICD-10-CM

## 2015-11-26 DIAGNOSIS — E039 Hypothyroidism, unspecified: Secondary | ICD-10-CM

## 2015-11-26 NOTE — Telephone Encounter (Signed)
Called and notified patient she could go get labs drawn that the orders were put in for her. Patient is going to [facility] and had no other questions at this time.

## 2015-11-26 NOTE — Telephone Encounter (Signed)
OK. HbA1c, TSH, free T4.

## 2015-11-26 NOTE — Telephone Encounter (Signed)
Patient has an appointment oct 31st and would like he lab order put in before her appt.

## 2015-12-23 ENCOUNTER — Ambulatory Visit: Payer: BLUE CROSS/BLUE SHIELD | Admitting: Cardiovascular Disease

## 2015-12-29 ENCOUNTER — Ambulatory Visit (INDEPENDENT_AMBULATORY_CARE_PROVIDER_SITE_OTHER): Payer: BLUE CROSS/BLUE SHIELD | Admitting: Cardiovascular Disease

## 2015-12-29 ENCOUNTER — Encounter: Payer: Self-pay | Admitting: Cardiovascular Disease

## 2015-12-29 VITALS — BP 110/60 | HR 84 | Ht 65.0 in | Wt 237.5 lb

## 2015-12-29 DIAGNOSIS — I5032 Chronic diastolic (congestive) heart failure: Secondary | ICD-10-CM | POA: Diagnosis not present

## 2015-12-29 DIAGNOSIS — I10 Essential (primary) hypertension: Secondary | ICD-10-CM

## 2015-12-29 DIAGNOSIS — E1140 Type 2 diabetes mellitus with diabetic neuropathy, unspecified: Secondary | ICD-10-CM

## 2015-12-29 DIAGNOSIS — R0602 Shortness of breath: Secondary | ICD-10-CM | POA: Diagnosis not present

## 2015-12-29 DIAGNOSIS — J441 Chronic obstructive pulmonary disease with (acute) exacerbation: Secondary | ICD-10-CM

## 2015-12-29 MED ORDER — POTASSIUM CHLORIDE ER 10 MEQ PO TBCR: 10.0000 meq | EXTENDED_RELEASE_TABLET | Freq: Every day | ORAL | 3 refills | Status: DC | PRN

## 2015-12-29 MED ORDER — FUROSEMIDE 20 MG PO TABS: 20.0000 mg | ORAL_TABLET | Freq: Every day | ORAL | 3 refills | Status: DC | PRN

## 2015-12-29 NOTE — Patient Instructions (Signed)
Medication Instructions:   Please take lasix every other day with potassium   Labwork:  No new labs needed  Testing/Procedures:  No further testing at this time   Follow-Up: It was a pleasure seeing you in the office today. Please call us if you have new issues that need to be addressed before your next appt.  [phone]  Your physician wants you to follow-up in: 6 months.  You will receive a reminder letter in the mail two months in advance. If you don't receive a letter, please call our office to schedule the follow-up appointment.  If you need a refill on your cardiac medications before your next appointment, please call your pharmacy.

## 2015-12-29 NOTE — Progress Notes (Signed)
Cardiology Office Note  Date:  12/29/2015   ID:  Alexandria [name], DOB 1935-12-15, MRN [id number]  PCP:  Elsie Stain, MD   Chief Complaint  Patient presents with  . other    12 month follow up. Meds reviewed by the pt's med list. Pt. c/o shortness of breath.     HPI:  80 year old woman with obesity,  long history of smoking for 50 years, COPD, hyperlipidemia, hypertension,  previously seen in clinic in 2012 with chest pain, stress test at that time with no ischemia and normal echocardiogram, July 2016 with COPD exacerbation, sepsis. History of sleep apnea, uses CPAP She presents for follow-up of her shortness of breath symptoms  She reports having shortness of breath for the past 2 yrs  Has periodic follow-up with pulmonary Not on oxygen afraid to get in the shower, has anxiety, shortness of breath Can not breath in hot steam No regular exercise  Reports being very limited in her ability to exert herself Presented today in a wheelchair as she is too short of breath to walk in her car to the clinic Stopped going to church as husband does not push her in a wheelchair. She is unable to walk as she gets out of breath.  She does report oxygen saturations 88, 89 % on a regular basis with exertion, recovers quickly Becoming more frustrated that she is able to do less noted by her shortness of breath symptoms  EKG on today's visit shows normal sinus rhythm with rate 84 bpm, nonspecific ST and T wave abnormality in lead 1 and aVL  Other past medical history reviewed Previous Escherichia coli sepsis,  At that time had anemia with hematocrit down to 25, iron deficiency anemia Treated for COPD exacerbation CT scan of the abdomen showed mild descending aorta atherosclerosis  She reports that she quit smoking 10 years ago Hemoglobin A1c 6.1 She is taking Crestor and zetia.    PMH:   has a past medical history of Allergy, unspecified not elsewhere classified; Chronic diastolic  congestive heart failure ([place]); COPD (chronic obstructive pulmonary disease) ([place]) (03/08/1998); Depression (12/07/1998); Diabetes mellitus type II (02/05/2006); Disorders of bursae and tendons in shoulder region, unspecified; E. coli bacteremia; Esophagitis; GERD (gastroesophageal reflux disease); History of UTI; Hyperlipidemia (10/04/2000); Hypertension (03/08/1992); Hypothyroidism (03/08/1968); Iron deficiency anemia; Obesity; OSA (obstructive sleep apnea); Peripheral neuropathy ([place]); and Ventral hernia.  PSH:    Past Surgical History:  Procedure Laterality Date  . ABD U/S  03/19/1999   Nml x2 foci in liver  . ADENOSINE MYOVIEW  06/02/2007   Nml  . CARDIOLITE PERSANTINE  08/24/2000   Nml  . CAROTID U/S  08/24/2000   1-39% ICA stenosis  . CAROTID U/S  06/02/2007   No apprec change   . CARPAL TUNNEL RELEASE  12/1997   Right  . CESAREAN SECTION     x2 Breech/ repeat  . CHOLECYSTECTOMY  1997  . CT ABD W & PELVIS WO/W CM     Abd hemangiomas of liver, 1 cm R renal cyst  . DEXA  07/03/2003   Nml  . ESOPHAGOGASTRODUODENOSCOPY  12/05/1997   Nml (due to hoarseness)  . GALLBLADDER SURGERY    . HERNIA REPAIR  01/24/2009   Lap Ventr w/ Lysis of adhesions (Dr. Donne Hazel)  . knee arthroscopic surgery  years ago   right  . ROTATOR CUFF REPAIR  1984   Right, [place]  . SHOULDER OPEN ROTATOR CUFF REPAIR  02/08/2012   Procedure: ROTATOR CUFF REPAIR SHOULDER  OPEN;  Surgeon: Magnus Sinning, MD;  Location: [facility];  Service: Orthopedics;  Laterality: Left;  Left Shoulder Open Anterior Acrominectomy Rotator Cuff Repair Open Distal Clavicle Resection ,tissue mend graft, and repair of biceps tendon  . THUMB RELEASE  12/1997   Right  . TONSILLECTOMY    . TOTAL ABDOMINAL HYSTERECTOMY  1985   Due to dysmennorhea  . US ECHOCARDIOGRAPHY  06/02/2007    Current Outpatient Prescriptions  Medication Sig Dispense Refill  . Coenzyme Q-10 200 MG CAPS Take 1 capsule by mouth daily.      Marland Kitchen EPINEPHrine  (EPIPEN) 0.3 mg/0.3 mL DEVI Inject 0.3 mg into the muscle as directed.     Marland Kitchen esomeprazole (NEXIUM) 40 MG capsule Take 40 mg by mouth daily at 12 noon.    . ezetimibe (ZETIA) 10 MG tablet Take 1 tablet (10 mg total) by mouth at bedtime.    Marland Kitchen FLUoxetine (PROZAC) 10 MG capsule TAKE 3 CAPSULES IN THE MORNING AND TAKE 3 CAPSULES IN THE EVENING 360 capsule 0  . fluticasone (FLONASE) 50 MCG/ACT nasal spray USE 2 SPRAYS INTO THE NOSE EVERY 12 HOURS AS NEEDED FOR STUFFY NOSE 48 g 1  . ibuprofen (ADVIL,MOTRIN) 200 MG tablet Take 200-400 mg by mouth every 6 (six) hours as needed for moderate pain.     Marland Kitchen LORazepam (ATIVAN) 0.5 MG tablet TAKE 1 TABLET BY MOUTH TWICE DAILY AS NEEDED 180 tablet 0  . nitroGLYCERIN (NITROSTAT) 0.4 MG SL tablet Place 1 tablet (0.4 mg total) under the tongue every 5 (five) minutes as needed for chest pain. 25 tablet 3  . olopatadine (PATANOL) 0.1 % ophthalmic solution Place 1 drop into the left eye 2 (two) times daily. 5 mL 0  . PROAIR HFA 108 (90 Base) MCG/ACT inhaler INHALE 2 PUFFS INTO THE LUNGS EVERY 6 HOURS AS NEEDED FOR WHEEZING OR SHORTNESS OF BREATH 1 Inhaler 2  . rosuvastatin (CRESTOR) 10 MG tablet Take 1 tablet (10 mg total) by mouth every other day. 90 tablet 1  . SYMBICORT 160-4.5 MCG/ACT inhaler INHALE 2 PUFFS FIRST THING IN THE MORNING AND 2 PUFFS AGAIN IN THE EVENING ABOUT 12 HOURS LATER 30.6 Inhaler 2  . SYNTHROID 125 MCG tablet TAKE 1 TABLET (125 MCG TOTAL) BY MOUTH DAILY BEFORE BREAKFAST. 90 tablet 1  . valsartan (DIOVAN) 160 MG tablet Take 1 tablet (160 mg total) by mouth 2 (two) times daily. 180 tablet 3  . vitamin C (VITAMIN C) 500 MG tablet Take 1 tablet (500 mg total) by mouth 2 (two) times daily. 60 tablet 0  . furosemide (LASIX) 20 MG tablet Take 1 tablet (20 mg total) by mouth daily as needed. 90 tablet 3  . potassium chloride (K-DUR) 10 MEQ tablet Take 1 tablet (10 mEq total) by mouth daily as needed. 90 tablet 3   No current facility-administered  medications for this visit.      Allergies:   Sulfonamide derivatives; Clarithromycin; Codeine; and Spiriva handihaler [tiotropium bromide monohydrate]   Social History:  The patient  reports that she quit smoking about 10 years ago. Her smoking use included Cigarettes. She has a 100.00 pack-year smoking history. She has never used smokeless tobacco. She reports that she drinks about 1.2 oz of alcohol per week . She reports that she does not use drugs.   Family History:   family history includes Breast cancer in her sister; Cystic fibrosis in her sister; Emphysema in her father; Heart disease in her mother; Hyperthyroidism in her brother  and sister; Osteoarthritis in her brother; Thyroid disease in her mother.    Review of Systems: Review of Systems  Constitutional: Negative.   Respiratory: Negative.   Cardiovascular: Negative.   Gastrointestinal: Negative.   Musculoskeletal: Negative.   Neurological: Negative.   Psychiatric/Behavioral: Negative.   All other systems reviewed and are negative.    PHYSICAL EXAM: VS:  BP 110/60 (BP Location: Left Arm, Patient Position: Sitting, Cuff Size: Normal)   Pulse 84   Ht '5\' 5"'$  (1.651 m)   Wt 237 lb 8 oz (107.7 kg)   SpO2 92%   BMI 39.52 kg/m  , BMI Body mass index is 39.52 kg/m. GEN: Well nourished, well developed, in no acute distress, obese  HEENT: normal  Neck: no JVD, carotid bruits, or masses Cardiac: RRR; no murmurs, rubs, or gallops,no edema  Respiratory:  Mildly decreased breath sounds throughout, normal work of breathing GI: soft, nontender, nondistended, + BS MS: no deformity or atrophy  Skin: warm and dry, no rash Neuro:  Strength and sensation are intact Psych: euthymic mood, full affect    Recent Labs: 03/14/2015: TSH 1.75 07/29/2015: BUN 24; Creatinine, Ser 0.95; Hemoglobin 11.3; Platelets 277.0; Potassium 4.5; Sodium 139    Lipid Panel Lab Results  Component Value Date   CHOL 91 09/29/2014   HDL 37 (L)  09/29/2014   LDLCALC 40 09/29/2014   TRIG 71 09/29/2014      Wt Readings from Last 3 Encounters:  12/29/15 237 lb 8 oz (107.7 kg)  07/29/15 233 lb (105.7 kg)  05/08/15 229 lb (103.9 kg)       ASSESSMENT AND PLAN:  Chronic diastolic CHF (congestive heart failure) (HCC) - Plan: EKG 12-Lead Recommended she start Lasix 20 mg with potassium every other day in an effort to try to improve her breathing. Previous echocardiogram last year did not show markedly elevated right heart pressures.  Essential hypertension - Plan: EKG 12-Lead Blood pressure is well controlled on today's visit. No changes made to the medications.  COPD exacerbation ([place]) She reports no recent COPD exacerbations,  Chronic shortness of breath as detailed below  Shortness of breath Chronic shortness of breath on exertion, likely predominantly secondary to underlying lung disease. Will try Lasix every other day with potassium for symptom relief. She does report frequent desaturations below 90. Given lifestyle limitations now, such as not going to church, amongst other places, perhaps could do a trial on nasal cannula oxygen with portable generator pack she could use for outside activities. Recommended she discuss this with pulmonary or primary care  Type 2 diabetes mellitus with diabetic neuropathy, without long-term current use of insulin ([place]) Encouraged low carbohydrate diet for weight loss  Morbid obesity ([place]) We have encouraged continued exercise, careful diet management in an effort to lose weight.   Total encounter time more than 25 minutes  Greater than 50% was spent in counseling and coordination of care with the patient   Disposition:   F/U  6 months   Orders Placed This Encounter  Procedures  . EKG 12-Lead     Signed, Esmond Plants, M.D., Ph.D. 12/29/2015  [place], [place]

## 2016-01-01 ENCOUNTER — Other Ambulatory Visit (INDEPENDENT_AMBULATORY_CARE_PROVIDER_SITE_OTHER): Payer: BLUE CROSS/BLUE SHIELD

## 2016-01-01 ENCOUNTER — Telehealth: Payer: Self-pay | Admitting: Cardiovascular Disease

## 2016-01-01 ENCOUNTER — Other Ambulatory Visit: Payer: Self-pay | Admitting: Family Medicine

## 2016-01-01 DIAGNOSIS — I10 Essential (primary) hypertension: Secondary | ICD-10-CM

## 2016-01-01 DIAGNOSIS — E119 Type 2 diabetes mellitus without complications: Secondary | ICD-10-CM | POA: Diagnosis not present

## 2016-01-01 DIAGNOSIS — E039 Hypothyroidism, unspecified: Secondary | ICD-10-CM

## 2016-01-01 LAB — COMPREHENSIVE METABOLIC PANEL
ALT: 13 U/L (ref 0–35)
AST: 13 U/L (ref 0–37)
Albumin: 3.9 g/dL (ref 3.5–5.2)
Alkaline Phosphatase: 91 U/L (ref 39–117)
BUN: 24 mg/dL — ABNORMAL HIGH (ref 6–23)
CO2: 28 mEq/L (ref 19–32)
Calcium: 9.3 mg/dL (ref 8.4–10.5)
Chloride: 104 mEq/L (ref 96–112)
Creatinine, Ser: 1.16 mg/dL (ref 0.40–1.20)
GFR: 47.98 mL/min — ABNORMAL LOW (ref >60.00)
Glucose, Bld: 120 mg/dL — ABNORMAL HIGH (ref 70–99)
Potassium: 4.8 mEq/L (ref 3.5–5.1)
Sodium: 140 mEq/L (ref 135–145)
Total Bilirubin: 0.4 mg/dL (ref 0.2–1.2)
Total Protein: 6.8 g/dL (ref 6.0–8.3)

## 2016-01-01 LAB — CBC WITH DIFFERENTIAL/PLATELET
Basophils Absolute: 0 10*3/uL (ref 0.0–0.1)
Basophils Relative: 0.4 % (ref 0.0–3.0)
Eosinophils Absolute: 0.2 10*3/uL (ref 0.0–0.7)
Eosinophils Relative: 3.2 % (ref 0.0–5.0)
HCT: 31 % — ABNORMAL LOW (ref 36.0–46.0)
Hemoglobin: 9.8 g/dL — ABNORMAL LOW (ref 12.0–15.0)
Lymphocytes Relative: 24.5 % (ref 12.0–46.0)
Lymphs Abs: 1.5 10*3/uL (ref 0.7–4.0)
MCHC: 31.5 g/dL (ref 30.0–36.0)
MCV: 76 fl — ABNORMAL LOW (ref 78.0–100.0)
Monocytes Absolute: 0.6 10*3/uL (ref 0.1–1.0)
Monocytes Relative: 9.8 % (ref 3.0–12.0)
Neutro Abs: 3.8 10*3/uL (ref 1.4–7.7)
Neutrophils Relative %: 62.1 % (ref 43.0–77.0)
Platelets: 267 10*3/uL (ref 150.0–400.0)
RBC: 4.08 Mil/uL (ref 3.87–5.11)
RDW: 17.8 % — ABNORMAL HIGH (ref 11.5–15.5)
WBC: 6 10*3/uL (ref 4.0–10.5)

## 2016-01-01 LAB — LIPID PANEL
Cholesterol: 165 mg/dL (ref 0–200)
HDL: 65.7 mg/dL (ref >39.00)
LDL Cholesterol: 80 mg/dL (ref 0–99)
NonHDL: 99.47
Total CHOL/HDL Ratio: 3
Triglycerides: 97 mg/dL (ref 0.0–149.0)
VLDL: 19.4 mg/dL (ref 0.0–40.0)

## 2016-01-01 LAB — HEMOGLOBIN A1C: Hgb A1c MFr Bld: 6.4 % (ref 4.6–6.5)

## 2016-01-01 NOTE — Telephone Encounter (Signed)
Pt calling stating we just placed patient on a new lasik and she says last night she had some really bad leg cramps She is okay now, just feels weak.  They lasted all night long She took it in the morning with a full glass of water after breakfast.  Please advise.   Went to [facility] for labs this morning and they took extra labs just in case we needed to order something

## 2016-01-01 NOTE — Telephone Encounter (Signed)
Left message for pt to call back  °

## 2016-01-02 NOTE — Telephone Encounter (Signed)
Left message for pt to call back  °

## 2016-01-02 NOTE — Telephone Encounter (Signed)
Spoke w/ pt.  She reports that she took 1 lasix 20 mg w/ potassium 10 meq after her ov on 12/29/15.  That night, she developed the worse leg cramps she has ever had, her toes were pointing straight up and she could not straighten her feet out to walk to the bathroom. She has not taken any more lasix or K+ since that time.  Last night, she has spasms in her legs & feet all night.  She denies wt gain, SOB or edema. She does not want to take any more lasix b/c the cramps were so bad.  Advised her that I will make Dr. Rockey Situ aware of her concerns and call her back w/ his recommendation.

## 2016-01-04 ENCOUNTER — Other Ambulatory Visit: Payer: Self-pay | Admitting: Family Medicine

## 2016-01-04 DIAGNOSIS — D649 Anemia, unspecified: Secondary | ICD-10-CM

## 2016-01-05 LAB — TSH: TSH: 0.93 u[IU]/mL (ref 0.35–4.50)

## 2016-01-05 LAB — T4, FREE: Free T4: 0.97 ng/dL (ref 0.60–1.60)

## 2016-01-05 NOTE — Telephone Encounter (Signed)
If unable to tolerate Lasix She will need to talk with pulmonary. Will likely need a trial of oxygen that she can wear on ambulation Perhaps they could order small oxygen generator that she can carry

## 2016-01-06 ENCOUNTER — Encounter: Payer: Self-pay | Admitting: Internal Medicine

## 2016-01-06 ENCOUNTER — Ambulatory Visit (INDEPENDENT_AMBULATORY_CARE_PROVIDER_SITE_OTHER): Payer: BLUE CROSS/BLUE SHIELD | Admitting: Internal Medicine

## 2016-01-06 ENCOUNTER — Other Ambulatory Visit (INDEPENDENT_AMBULATORY_CARE_PROVIDER_SITE_OTHER): Payer: BLUE CROSS/BLUE SHIELD

## 2016-01-06 VITALS — BP 120/78 | HR 97 | Wt 238.0 lb

## 2016-01-06 DIAGNOSIS — Z23 Encounter for immunization: Secondary | ICD-10-CM

## 2016-01-06 DIAGNOSIS — E039 Hypothyroidism, unspecified: Secondary | ICD-10-CM | POA: Diagnosis not present

## 2016-01-06 DIAGNOSIS — E1140 Type 2 diabetes mellitus with diabetic neuropathy, unspecified: Secondary | ICD-10-CM

## 2016-01-06 DIAGNOSIS — D649 Anemia, unspecified: Secondary | ICD-10-CM

## 2016-01-06 MED ORDER — METFORMIN HCL 500 MG PO TABS: 500.0000 mg | ORAL_TABLET | Freq: Every day | ORAL | 3 refills | Status: DC

## 2016-01-06 NOTE — Addendum Note (Signed)
Addended by: Caprice Beaver T on: 01/06/2016 02:06 PM   Modules accepted: Orders

## 2016-01-06 NOTE — Telephone Encounter (Signed)
Spoke w/ pt.  Advised her of Dr. Donivan Scull recommendation.  She has an appt w/ Dr. Halford Chessman on 01/13/16. She requests that I make his office aware and see if they have any sooner available openings.

## 2016-01-06 NOTE — Progress Notes (Signed)
Patient ID: Alexandria [name], female   DOB: 12/25/1935, 80 y.o.   MRN: [id number]  HPI: Alexandria [name] is a 80 y.o.-year-old female, returning for follow-up for DM2, dx in 2006, non-insulin-dependent, controlled, with complications (mild CKD) and hypothyroidism. Last visit 9 months ago.  She has exertion SOB. She is anemic >> last HbA1c was 9.8 5 days ago.  DM2: Last hemoglobin A1c was: Lab Results  Component Value Date   HGBA1C 6.4 01/01/2016   HGBA1C 6.1 03/14/2015   HGBA1C 6.1 (H) 09/29/2014   Pt is not on any meds for her diabetes. She was on Januvia >> stopped in summer 2016, when she was admitted for sepsis.   Pt checks her sugars 2-3 a day and they are: - am: 109-130 >> 120, 137 >> 126-147, 157 - 2h after brunch: 110-130, 189 >> 156-184 - 3-4h after brunch: 202 >> 112-156, 185 >> 126-195 - before dinner: 103 >> n/c - 2h after dinner: 130-166 >> 151, 157 >> 106-173, 188 - bedtime: 135 >> 132-197, 251x1 - nighttime: n/c >> 169, 175 >> n/c No lows. Lowest sugar was 100 >> 112 >> 106; she has hypoglycemia awareness at 70.  Highest sugar was 260 >> 257.  Glucometer:One Touch Ultra mini  Pt's meals are: - Breakfast: protein drink, egg, cereals - Lunch: sandwich - Dinner: meat + 2 veggies - Snacks: 1-3 peanut butter, milk, crackers  - + mild CKD, last BUN/creatinine:  Lab Results  Component Value Date   BUN 24 (H) 01/01/2016   CREATININE 1.16 01/01/2016  On Losartan. - last set of lipids: Lab Results  Component Value Date   CHOL 165 01/01/2016   HDL 65.70 01/01/2016   LDLCALC 80 01/01/2016   LDLDIRECT 63.0 07/29/2015   TRIG 97.0 01/01/2016   CHOLHDL 3 01/01/2016  On Zetia, Crestor. - last eye exam was in 01/15/2015. No DR.  - no numbness and tingling in her feet.  She also has a h/o hypothyroidism.  She is on Synthroid (DAW) 125 g daily: - in am (~ 5 am) - fasting - with water - eats b'fast 3-4 later - no PPI - stopped iron - no calcium - no  MVI  Last TSH recently normal: Lab Results  Component Value Date   TSH 0.93 01/01/2016   She has a h/o COPD stage 2 - Dr Halford Chessman, HL, HTN, anemia, GERD.  She was dx'ed with skin cancer - face >> excised 05/03/2015.  ROS: Constitutional: + weight gain, + fatigue, no subjective hyperthermia/hypothermia, + nocturia Eyes: no blurry vision, no xerophthalmia ENT: no sore throat, no nodules palpated in throat, no dysphagia/odynophagia, no hoarseness Cardiovascular: no CP/+ SOB/no palpitations/+ leg swelling Respiratory: no cough/+ SOB Gastrointestinal: no N/V/D/C Musculoskeletal: no muscle/joint aches Skin: no rashes, no hair loss Neurological: no tremors/numbness/tingling/dizziness, + HA  I reviewed pt's medications, allergies, PMH, social hx, family hx, and changes were documented in the history of present illness. Otherwise, unchanged from my initial visit note.  Past Medical History:  Diagnosis Date  . Allergy, unspecified not elsewhere classified   . Chronic diastolic congestive heart failure ([place])   . COPD (chronic obstructive pulmonary disease) ([place]) 03/08/1998   PFTs 12/12/2002 FEV 1 1.42 (64%) ratio 58 with no better after B2 and DLCO75%; PFTs 11/20/09 FEV1 1.50 (73%) ratio 50 no better after B2 with DLCO 62%; Hfa 50% 11/20/2009 >75%, 01/13/10 p coaching  . Depression 12/07/1998  . Diabetes mellitus type II 02/05/2006   Dr. Elyse Hsu with endo  .  Disorders of bursae and tendons in shoulder region, unspecified    Rotator cuff syndrome, right  . E. coli bacteremia   . Esophagitis   . GERD (gastroesophageal reflux disease)   . History of UTI   . Hyperlipidemia 10/04/2000  . Hypertension 03/08/1992  . Hypothyroidism 03/08/1968  . Iron deficiency anemia   . Obesity    NOS  . OSA (obstructive sleep apnea)    PSG 01/27/10 AHI 13, pt does not know CPAP settings  . Peripheral neuropathy ([facility])    Likely due to DM per Dr. Erling Cruz  . Ventral hernia    Past Surgical History:  Procedure  Laterality Date  . ABD U/S  03/19/1999   Nml x2 foci in liver  . ADENOSINE MYOVIEW  06/02/2007   Nml  . CARDIOLITE PERSANTINE  08/24/2000   Nml  . CAROTID U/S  08/24/2000   1-39% ICA stenosis  . CAROTID U/S  06/02/2007   No apprec change   . CARPAL TUNNEL RELEASE  12/1997   Right  . CESAREAN SECTION     x2 Breech/ repeat  . CHOLECYSTECTOMY  1997  . CT ABD W & PELVIS WO/W CM     Abd hemangiomas of liver, 1 cm R renal cyst  . DEXA  07/03/2003   Nml  . ESOPHAGOGASTRODUODENOSCOPY  12/05/1997   Nml (due to hoarseness)  . GALLBLADDER SURGERY    . HERNIA REPAIR  01/24/2009   Lap Ventr w/ Lysis of adhesions (Dr. Donne Hazel)  . knee arthroscopic surgery  years ago   right  . ROTATOR CUFF REPAIR  1984   Right, [place]  . SHOULDER OPEN ROTATOR CUFF REPAIR  02/08/2012   Procedure: ROTATOR CUFF REPAIR SHOULDER OPEN;  Surgeon: Magnus Sinning, MD;  Location: [facility];  Service: Orthopedics;  Laterality: Left;  Left Shoulder Open Anterior Acrominectomy Rotator Cuff Repair Open Distal Clavicle Resection ,tissue mend graft, and repair of biceps tendon  . THUMB RELEASE  12/1997   Right  . TONSILLECTOMY    . TOTAL ABDOMINAL HYSTERECTOMY  1985   Due to dysmennorhea  . US ECHOCARDIOGRAPHY  06/02/2007   Social History   Occupational History  . Retired - self employed- Engineer, structural    Social History Main Topics  . Smoking status: Former Smoker -- 2.00 packs/day for 50 years    Types: Cigarettes    Quit date: 2007  . Smokeless tobacco: Never Used  . Alcohol Use: 1.2 oz/week    2 Standard drinks or equivalent per week     Comment: occasional  . Drug Use: No  . Sexual Activity: Not on file   Social History Narrative   Married with 2 children   Enjoys painting   Current Outpatient Prescriptions on File Prior to Visit  Medication Sig Dispense Refill  . Coenzyme Q-10 200 MG CAPS Take 1 capsule by mouth daily.      Marland Kitchen EPINEPHrine (EPIPEN) 0.3 mg/0.3 mL DEVI Inject 0.3 mg into  the muscle as directed.     Marland Kitchen esomeprazole (NEXIUM) 40 MG capsule Take 40 mg by mouth daily at 12 noon.    . ezetimibe (ZETIA) 10 MG tablet Take 1 tablet (10 mg total) by mouth at bedtime.    Marland Kitchen FLUoxetine (PROZAC) 10 MG capsule TAKE 3 CAPSULES IN THE MORNING AND TAKE 3 CAPSULES IN THE EVENING 360 capsule 0  . fluticasone (FLONASE) 50 MCG/ACT nasal spray USE 2 SPRAYS INTO THE NOSE EVERY 12 HOURS AS NEEDED FOR STUFFY NOSE  48 g 1  . furosemide (LASIX) 20 MG tablet Take 1 tablet (20 mg total) by mouth daily as needed. 90 tablet 3  . ibuprofen (ADVIL,MOTRIN) 200 MG tablet Take 200-400 mg by mouth every 6 (six) hours as needed for moderate pain.     Marland Kitchen LORazepam (ATIVAN) 0.5 MG tablet TAKE 1 TABLET BY MOUTH TWICE DAILY AS NEEDED 180 tablet 0  . nitroGLYCERIN (NITROSTAT) 0.4 MG SL tablet Place 1 tablet (0.4 mg total) under the tongue every 5 (five) minutes as needed for chest pain. 25 tablet 3  . olopatadine (PATANOL) 0.1 % ophthalmic solution Place 1 drop into the left eye 2 (two) times daily. 5 mL 0  . potassium chloride (K-DUR) 10 MEQ tablet Take 1 tablet (10 mEq total) by mouth daily as needed. 90 tablet 3  . PROAIR HFA 108 (90 Base) MCG/ACT inhaler INHALE 2 PUFFS INTO THE LUNGS EVERY 6 HOURS AS NEEDED FOR WHEEZING OR SHORTNESS OF BREATH 1 Inhaler 2  . rosuvastatin (CRESTOR) 10 MG tablet Take 1 tablet (10 mg total) by mouth every other day. 90 tablet 1  . SYMBICORT 160-4.5 MCG/ACT inhaler INHALE 2 PUFFS FIRST THING IN THE MORNING AND 2 PUFFS AGAIN IN THE EVENING ABOUT 12 HOURS LATER 30.6 Inhaler 2  . SYNTHROID 125 MCG tablet TAKE 1 TABLET (125 MCG TOTAL) BY MOUTH DAILY BEFORE BREAKFAST. 90 tablet 1  . valsartan (DIOVAN) 160 MG tablet Take 1 tablet (160 mg total) by mouth 2 (two) times daily. 180 tablet 3  . vitamin C (VITAMIN C) 500 MG tablet Take 1 tablet (500 mg total) by mouth 2 (two) times daily. 60 tablet 0   No current facility-administered medications on file prior to visit.    Allergies   Allergen Reactions  . Sulfonamide Derivatives Swelling    REACTION: closed throat  . Clarithromycin     REACTION: diff swallowing and mouth blisters  . Codeine Other (See Comments)    Unknown   . Spiriva Handihaler [Tiotropium Bromide Monohydrate] Other (See Comments)    Voice changes   Family History  Problem Relation Age of Onset  . Heart disease Mother   . Thyroid disease Mother   . Emphysema Father     One lung  . Breast cancer Sister   . Cystic fibrosis Sister   . Hyperthyroidism Sister   . Osteoarthritis Brother   . Hyperthyroidism Brother   . Esophageal cancer Neg Hx   . Cancer Neg Hx     Head or neck   PE: BP 120/78 (BP Location: Left Arm, Patient Position: Sitting)   Pulse 97   Wt 238 lb (108 kg)   SpO2 96%   BMI 39.61 kg/m  Body mass index is 39.61 kg/m.  Wt Readings from Last 3 Encounters:  01/06/16 238 lb (108 kg)  12/29/15 237 lb 8 oz (107.7 kg)  07/29/15 233 lb (105.7 kg)   Constitutional: overweight, in NAD Eyes: PERRLA, EOMI, no exophthalmos ENT: moist mucous membranes, no thyromegaly, no cervical lymphadenopathy Cardiovascular: RRR, No MRG Respiratory: CTA B Gastrointestinal: abdomen soft, NT, ND, BS+ Musculoskeletal: no deformities, strength intact in all 4 Skin: moist, warm, no rashes Neurological: + significant tremor with outstretched hands, DTR normal in all 4  ASSESSMENT: 1. DM2, non-insulin-dependent, controlled, with complications - mild CKD  2. Hypothyroidism  PLAN:  1. Patient with long-standing, controlled diabetes, off oral antidiabetic regimen (Januvia) after her sepsis hospitalization in 2016, with good diabetes control - sugars only slightly higher today. They  are higher in am >> will add metformin low dose (500 mg) with dinner. - reviewed latest HbA1c (obtained before this appt, per her preference) >> 6.4%, which is still good! - continue checking sugars at different times of the day - check once a day, rotating checks -  advised for yearly eye exams >> she is UTD - will give her the flu shot today - Return to clinic in 4 mo with sugar log   2. Hypothyroidism - check TFTs before this visit per her preference >> normal - continue Synthroid 125 mcg daily - advised her to take the thyroid hormone every day, with water, >30 minutes before breakfast, separated by >4 hours from acid reflux medications, calcium, iron, multivitamins. She is taking it correctly.  Philemon Kingdom, MD PhD [facility]

## 2016-01-06 NOTE — Patient Instructions (Signed)
Please start Metformin 500 mg with dinner.  Continue Synthroid 125 mcg daily.  Please continue to take the thyroid hormone every day, with water, >30 minutes before breakfast, separated by >4 hours from acid reflux medications, calcium, iron, multivitamins.  Please return in 3 months with your sugar log.

## 2016-01-07 ENCOUNTER — Telehealth: Payer: Self-pay

## 2016-01-07 LAB — IBC PANEL
Iron: 14 ug/dL — ABNORMAL LOW (ref 42–145)
Saturation Ratios: 3.3 % — ABNORMAL LOW (ref 20.0–50.0)
Transferrin: 307 mg/dL (ref 212.0–360.0)

## 2016-01-07 LAB — CBC WITH DIFFERENTIAL/PLATELET
Basophils Absolute: 0.1 10*3/uL (ref 0.0–0.1)
Basophils Relative: 0.7 % (ref 0.0–3.0)
Eosinophils Absolute: 0.1 10*3/uL (ref 0.0–0.7)
Eosinophils Relative: 1.4 % (ref 0.0–5.0)
HCT: 30.6 % — ABNORMAL LOW (ref 36.0–46.0)
Hemoglobin: 9.6 g/dL — ABNORMAL LOW (ref 12.0–15.0)
Lymphocytes Relative: 18.8 % (ref 12.0–46.0)
Lymphs Abs: 1.7 10*3/uL (ref 0.7–4.0)
MCHC: 31.5 g/dL (ref 30.0–36.0)
MCV: 75.8 fl — ABNORMAL LOW (ref 78.0–100.0)
Monocytes Absolute: 0.4 10*3/uL (ref 0.1–1.0)
Monocytes Relative: 4.8 % (ref 3.0–12.0)
Neutro Abs: 6.7 10*3/uL (ref 1.4–7.7)
Neutrophils Relative %: 74.3 % (ref 43.0–77.0)
Platelets: 284 10*3/uL (ref 150.0–400.0)
RBC: 4.04 Mil/uL (ref 3.87–5.11)
RDW: 17.4 % — ABNORMAL HIGH (ref 11.5–15.5)
WBC: 9 10*3/uL (ref 4.0–10.5)

## 2016-01-07 NOTE — Telephone Encounter (Signed)
Pt left v/m; pt was seen 01/06/16; pt had labs done and pt said hgb 9.6 and iron level is 14. pt wants to know what to do about getting iron level up. Pt request cb with instructions.

## 2016-01-07 NOTE — Telephone Encounter (Signed)
Patrice,  Will you please check for this pt and see if there is anything sooner with Dr. Halford Chessman? Thanks

## 2016-01-08 ENCOUNTER — Other Ambulatory Visit: Payer: Self-pay | Admitting: Family Medicine

## 2016-01-08 DIAGNOSIS — D509 Iron deficiency anemia, unspecified: Secondary | ICD-10-CM

## 2016-01-08 MED ORDER — FERROUS SULFATE 325 (65 FE) MG PO TBEC: 325.0000 mg | DELAYED_RELEASE_TABLET | Freq: Every day | ORAL | Status: DC

## 2016-01-08 NOTE — Telephone Encounter (Signed)
Dr. Halford Chessman has an availability on 01/12/16 at 9:30am or 9:45am. Would you like me to move this patient - pr

## 2016-01-08 NOTE — Telephone Encounter (Signed)
Pt would like to come in on 01/12/16 @ 9:30am. Will you please change that appt? Thanks.

## 2016-01-08 NOTE — Telephone Encounter (Signed)
Moved patient's appointment and contacted pt to advise of new appointment date and time - pr

## 2016-01-12 ENCOUNTER — Encounter: Payer: Self-pay | Admitting: Pulmonary Disease

## 2016-01-12 ENCOUNTER — Ambulatory Visit (INDEPENDENT_AMBULATORY_CARE_PROVIDER_SITE_OTHER)
Admission: RE | Admit: 2016-01-12 | Discharge: 2016-01-12 | Disposition: A | Discharge status: Home / Self Care | Payer: BLUE CROSS/BLUE SHIELD | Source: Ambulatory Visit | Attending: Pulmonary Disease | Admitting: Pulmonary Disease

## 2016-01-12 ENCOUNTER — Ambulatory Visit (INDEPENDENT_AMBULATORY_CARE_PROVIDER_SITE_OTHER): Payer: BLUE CROSS/BLUE SHIELD | Admitting: Pulmonary Disease

## 2016-01-12 VITALS — BP 146/84 | HR 87 | Ht 65.0 in | Wt 237.2 lb

## 2016-01-12 DIAGNOSIS — J432 Centrilobular emphysema: Secondary | ICD-10-CM

## 2016-01-12 DIAGNOSIS — R058 Other specified cough: Secondary | ICD-10-CM

## 2016-01-12 DIAGNOSIS — R0602 Shortness of breath: Secondary | ICD-10-CM

## 2016-01-12 DIAGNOSIS — R05 Cough: Secondary | ICD-10-CM | POA: Diagnosis not present

## 2016-01-12 DIAGNOSIS — G4733 Obstructive sleep apnea (adult) (pediatric): Secondary | ICD-10-CM

## 2016-01-12 DIAGNOSIS — Z9989 Dependence on other enabling machines and devices: Secondary | ICD-10-CM

## 2016-01-12 MED ORDER — UMECLIDINIUM BROMIDE 62.5 MCG/INH IN AEPB: 1.0000 | INHALATION_SPRAY | Freq: Every day | RESPIRATORY_TRACT | 5 refills | Status: DC

## 2016-01-12 NOTE — Progress Notes (Signed)
Current Outpatient Prescriptions on File Prior to Visit  Medication Sig  . Coenzyme Q-10 200 MG CAPS Take 1 capsule by mouth daily.    Marland Kitchen EPINEPHrine (EPIPEN) 0.3 mg/0.3 mL DEVI Inject 0.3 mg into the muscle as directed.   Marland Kitchen esomeprazole (NEXIUM) 40 MG capsule Take 40 mg by mouth daily at 12 noon.  . ezetimibe (ZETIA) 10 MG tablet Take 1 tablet (10 mg total) by mouth at bedtime.  . ferrous sulfate 325 (65 FE) MG EC tablet Take 1 tablet (325 mg total) by mouth daily with breakfast.  . FLUoxetine (PROZAC) 10 MG capsule TAKE 3 CAPSULES IN THE MORNING AND TAKE 3 CAPSULES IN THE EVENING  . fluticasone (FLONASE) 50 MCG/ACT nasal spray USE 2 SPRAYS INTO THE NOSE EVERY 12 HOURS AS NEEDED FOR STUFFY NOSE  . furosemide (LASIX) 20 MG tablet Take 1 tablet (20 mg total) by mouth daily as needed.  Marland Kitchen ibuprofen (ADVIL,MOTRIN) 200 MG tablet Take 200-400 mg by mouth every 6 (six) hours as needed for moderate pain.   Marland Kitchen LORazepam (ATIVAN) 0.5 MG tablet TAKE 1 TABLET BY MOUTH TWICE DAILY AS NEEDED  . metFORMIN (GLUCOPHAGE) 500 MG tablet Take 1 tablet (500 mg total) by mouth daily with supper.  . nitroGLYCERIN (NITROSTAT) 0.4 MG SL tablet Place 1 tablet (0.4 mg total) under the tongue every 5 (five) minutes as needed for chest pain.  Marland Kitchen olopatadine (PATANOL) 0.1 % ophthalmic solution Place 1 drop into the left eye 2 (two) times daily.  . potassium chloride (K-DUR) 10 MEQ tablet Take 1 tablet (10 mEq total) by mouth daily as needed.  Marland Kitchen PROAIR HFA 108 (90 Base) MCG/ACT inhaler INHALE 2 PUFFS INTO THE LUNGS EVERY 6 HOURS AS NEEDED FOR WHEEZING OR SHORTNESS OF BREATH  . rosuvastatin (CRESTOR) 10 MG tablet Take 1 tablet (10 mg total) by mouth every other day.  . SYMBICORT 160-4.5 MCG/ACT inhaler INHALE 2 PUFFS FIRST THING IN THE MORNING AND 2 PUFFS AGAIN IN THE EVENING ABOUT 12 HOURS LATER  . SYNTHROID 125 MCG tablet TAKE 1 TABLET (125 MCG TOTAL) BY MOUTH DAILY BEFORE BREAKFAST.  . valsartan (DIOVAN) 160 MG tablet Take 1  tablet (160 mg total) by mouth 2 (two) times daily.  . vitamin C (VITAMIN C) 500 MG tablet Take 1 tablet (500 mg total) by mouth 2 (two) times daily.   No current facility-administered medications on file prior to visit.     Chief Complaint  Patient presents with  . Acute Visit    Pt. c/o of feeling more SOB, even walking short distances hard for her to catch her breath, She does hear herself wheezing,Using her inhaler more, clearing her throat alot    Sleep tests PSG 01/27/10 >> AHI 13, SpO2 low 73% ONO with CPAP 08/09/14 >> Test time 9 hr 16 min. Mean SpO2 92.2%, low SpO2 86%. Spent 10 min with SpO2 < 88%  Pulmonary tests RAST 10/02/10 >> negative, IgE 8.1 A1AT 04/15/11 >> MM PFT 10/512 >> FEV1 1.29 (61%), FEV1% 57, TLC 6.01 (119%), RV 3.52 (153%), DLCO 67%, no BD PFT 08/15/14 >> FEV1 1.07 (57%), FEV1% 55, TLC 6.49 (128%), DLCO 47%, no BD  Cardiac tests Echo 09/28/14 >> mild LVH, EF 55 to 60%  Past medical history Depression, Hypothyroidism, HTN, Diastolic CHF, HLD, DM, GERD, Peripheral neuropathy  Past surgical history, Family history, Social history, Allergies reviewed  Vital signs BP (!) 146/84 (BP Location: Right Arm, Patient Position: Sitting, Cuff Size: Normal)   Pulse 87  Ht '5\' 5"'$  (1.651 m)   Wt 237 lb 3.2 oz (107.6 kg)   SpO2 97%   BMI 39.47 kg/m   History of Present Illness: Alexandria [name] is a 80 y.o. female with dyspnea from COPD, diastolic CHF, deconditioning, and iron deficiency anemia.  She also has hx of OSA.  She has notice more trouble with her breathing.  She gets winded with minimal exertion.  She will get wheezing and cough, but feels this comes from her throat and her chest.  She has sinus congestion and post nasal drip.  She hasn't been using CPAP >> not sure why she stopped.  She has been sleeping in a recliner chair.  She was seen by cardiology >> started lasix, and caused leg cramps but not improvement with breathing.  She has been using symbicort  and albuterol >> these help.  She has been using flonase.  Denies reflux.    Attempted ambulatory oximetry today - only able to walk 1 lab due to feeling short of breath.  No desaturation and HR went up to 101.   Physical Exam:  General - No distress ENT - No sinus tenderness, no oral exudate, no LAN, raspy voice, wheeze over throat Cardiac - s1s2 regular, no murmur Chest - No wheeze/rales/dullness Back - No focal tenderness Abd - Soft, non-tender Ext - No edema Neuro - Normal strength Skin - No rashes Psych - normal mood, and behavior   CMP Latest Ref Rng & Units 01/01/2016 07/29/2015 10/18/2014  Glucose 70 - 99 mg/dL 120(H) 110(H) 87  BUN 6 - 23 mg/dL 24(H) 24(H) 27(H)  Creatinine 0.40 - 1.20 mg/dL 1.16 0.95 1.06  Sodium 135 - 145 mEq/L 140 139 137  Potassium 3.5 - 5.1 mEq/L 4.8 4.5 5.1  Chloride 96 - 112 mEq/L 104 106 103  CO2 19 - 32 mEq/L '28 29 26  '$ Calcium 8.4 - 10.5 mg/dL 9.3 9.7 9.4  Total Protein 6.0 - 8.3 g/dL 6.8 - 7.3  Total Bilirubin 0.2 - 1.2 mg/dL 0.4 - 0.6  Alkaline Phos 39 - 117 U/L 91 - 90  AST 0 - 37 U/L 13 - 24  ALT 0 - 35 U/L 13 - 19    CBC Latest Ref Rng & Units 01/06/2016 01/01/2016 07/29/2015  WBC 4.0 - 10.5 K/uL 9.0 6.0 8.2  Hemoglobin 12.0 - 15.0 g/dL 9.6(L) 9.8(L) 11.3(L)  Hematocrit 36.0 - 46.0 % 30.6(L) 31.0(L) 35.0(L)  Platelets 150.0 - 400.0 K/uL 284.0 267.0 277.0     Dg Chest Port 1 View  Result Date: 09/27/2014 CLINICAL DATA:  COPD, dizziness, fever. EXAM: PORTABLE CHEST - 1 VIEW COMPARISON:  Chest x-ray dated 07/31/2014. FINDINGS: Heart size is upper normal, unchanged. Overall cardiomediastinal silhouette is stable in size and configuration. Lungs are clear. Incidental note made of a prominent epicardial fat pad at the left lung base. No evidence of pneumonia. No pleural effusion. No pneumothorax. No acute osseous abnormality. IMPRESSION: No evidence of acute cardiopulmonary abnormality. No evidence of pneumonia. Electronically Signed   By:  Franki Cabot M.D.   On: 09/27/2014 13:55   Discussion: She has progressive symptoms of dyspnea, wheeze, and cough.  This is likely from upper airway cough syndrome with post nasal drip, progression of COPD, and deconditioning.  Her diastolic CHF likely has some role, but doesn't seem to be a prominent issue at present.  Assessment/Plan:  Upper airway cough syndrome. - add nasal irrigation - continue flonase daily - sip water with urge to cough -  salt water gargles - voice rest as able - avoid forcing cough  COPD with emphysema. - add incruse - continue symbicort and prn proair for now - chest xray today - will refill her handicap parking form  Obstructive sleep apnea. - advised her to resume CPAP  Obesity. - discussed importance of weight loss, and how her weight is impacting her breathing   Patient Instructions  Incruse 1 puff daily  Saline nasal spray daily Sip water when you have urge to cough Use salt water gargles once or twice per day Avoid forcing a cough Rest your voice as able  Chest xray today  Follow up in 2 months with Dr. Halford Chessman or nurse practitioner   Chesley Mires, MD [place] [facility] Pager:  [phone] 01/12/2016, 10:05 AM

## 2016-01-12 NOTE — Patient Instructions (Signed)
Incruse 1 puff daily  Saline nasal spray daily Sip water when you have urge to cough Use salt water gargles once or twice per day Avoid forcing a cough Rest your voice as able  Chest xray today  Follow up in 2 months with Dr. Halford Chessman or nurse practitioner

## 2016-01-13 ENCOUNTER — Telehealth: Payer: Self-pay

## 2016-01-13 ENCOUNTER — Encounter: Payer: BLUE CROSS/BLUE SHIELD | Admitting: Family Medicine

## 2016-01-13 ENCOUNTER — Ambulatory Visit: Payer: BLUE CROSS/BLUE SHIELD | Admitting: Pulmonary Disease

## 2016-01-13 DIAGNOSIS — M8580 Other specified disorders of bone density and structure, unspecified site: Secondary | ICD-10-CM

## 2016-01-13 DIAGNOSIS — Z1239 Encounter for other screening for malignant neoplasm of breast: Secondary | ICD-10-CM

## 2016-01-13 NOTE — Telephone Encounter (Signed)
Pt has mammo and bone density scheduled on 12/05 and they're requiring orders. [facility]  [phone].

## 2016-01-15 ENCOUNTER — Other Ambulatory Visit: Payer: Self-pay | Admitting: *Deleted

## 2016-01-15 ENCOUNTER — Telehealth: Payer: Self-pay | Admitting: Pulmonary Disease

## 2016-01-15 ENCOUNTER — Ambulatory Visit (INDEPENDENT_AMBULATORY_CARE_PROVIDER_SITE_OTHER): Payer: BLUE CROSS/BLUE SHIELD | Admitting: Family Medicine

## 2016-01-15 ENCOUNTER — Encounter: Payer: Self-pay | Admitting: Family Medicine

## 2016-01-15 VITALS — BP 110/60 | HR 83 | Temp 97.8°F | Ht 65.0 in | Wt 237.5 lb

## 2016-01-15 DIAGNOSIS — J449 Chronic obstructive pulmonary disease, unspecified: Secondary | ICD-10-CM

## 2016-01-15 DIAGNOSIS — R0602 Shortness of breath: Secondary | ICD-10-CM | POA: Diagnosis not present

## 2016-01-15 DIAGNOSIS — Z0000 Encounter for general adult medical examination without abnormal findings: Secondary | ICD-10-CM

## 2016-01-15 DIAGNOSIS — D509 Iron deficiency anemia, unspecified: Secondary | ICD-10-CM

## 2016-01-15 DIAGNOSIS — R918 Other nonspecific abnormal finding of lung field: Secondary | ICD-10-CM

## 2016-01-15 MED ORDER — FLUOXETINE HCL 10 MG PO CAPS: ORAL_CAPSULE | ORAL | 3 refills | Status: DC

## 2016-01-15 NOTE — Telephone Encounter (Signed)
Dg Chest 2 View  Result Date: 01/12/2016 CLINICAL DATA:  Sever sob, diabetic, HTN, x-smoker, hx of COPD, CHF, EXAM: CHEST - 2 VIEW COMPARISON:  09/30/2014 FINDINGS: 12 mm lingular nodule, and a possible adjacent 6 mm nodule more laterally. Possible 7 mm right upper lobe nodule. These were not evident on prior studies. Heart size and mediastinal contours are within normal limits. Mildly tortuous atheromatous aorta. No effusion. Visualized bones unremarkable. Surgical clips in the upper abdomen. IMPRESSION: 1. Lingular and possible right upper lobe nodules. Consider CT chest for further characterization. 2.  Aortic Atherosclerosis (ICD10-170.0) Electronically Signed   By: Lucrezia Europe M.D.   On: 01/12/2016 12:02    Left message for patient to call back to discuss CXR findings.  She has 2 new nodules and needs CT chest with contrast to further assess.

## 2016-01-15 NOTE — Patient Instructions (Addendum)
Please call Dr. Halford Chessman about the incruse issue (the voice change and cough) and the xray results.   [name] will call about your referral.  See her on the way out.  Take care.  Glad to see you.

## 2016-01-15 NOTE — Progress Notes (Signed)
I have personally reviewed the Medicare Annual Wellness questionnaire and have noted 1. The patient's medical and social history 2. Their use of alcohol, tobacco or illicit drugs 3. Their current medications and supplements 4. The patient's functional ability including ADL's, fall risks, home safety risks and hearing or visual             impairment. 5. Diet and physical activities 6. Evidence for depression or mood disorders  The patients weight, height, BMI have been recorded in the chart and visual acuity is per eye clinic.  I have made referrals, counseling and provided education to the patient based review of the above and I have provided the pt with a written personalized care plan for preventive services.  Provider list updated- see scanned forms.  Routine anticipatory guidance given to patient.  See health maintenance.  Flu 2017 Shingles 2007 PNA up to date Tetanus 2012 Colonoscopy d/w pt.  See below.  Breast cancer screening= mammogram pending.  dxa pending.   Advance directive- husband and daughter Adah Salvage equally designated if patient were incapacitated.  Cognitive function addressed- see scanned forms- and if abnormal then additional documentation follows.   Anemia.  Off iron since prev HGB was reasonable.  She has had dark stools recently in the last month- this is a recent change. I had prev offered colon cancer screening.  GI had prev offered colon cancer screening.    Shortness of breath. She has noted decreasing exercise tolerance. She has trouble walking around a large department store. She gets out of breath more easily with exertion. She has no symptoms at rest. She is not having chest pain. She has no symptoms at rest at the office visit sitting down talking to me. This is gradually gotten worse recently. Diabetes per endocrine clinic.  COPD per pulmonary clinic. She has noted some voice changes and throat irritation with one of her inhalers, likely incruse.  She  had similar symptoms with Spiriva. See after visit summary.  PMH and SH reviewed  Meds, vitals, and allergies reviewed.   ROS: Per HPI.  Unless specifically indicated otherwise in HPI, the patient denies:  General: fever. Eyes: acute vision changes ENT: sore throat Cardiovascular: chest pain Respiratory: SOB GI: vomiting GU: dysuria Musculoskeletal: acute back pain Derm: acute rash Neuro: acute motor dysfunction Psych: worsening mood Endocrine: polydipsia Heme: bleeding Allergy: hayfever  GEN: nad, alert and oriented HEENT: mucous membranes moist NECK: supple w/o LA CV: rrr. PULM: ctab, no inc wob ABD: soft, +bs, not ttp.  EXT: no edema SKIN: no acute rash

## 2016-01-15 NOTE — Progress Notes (Signed)
Pre visit review using our clinic review tool, if applicable. No additional management support is needed unless otherwise documented below in the visit note. 

## 2016-01-15 NOTE — Telephone Encounter (Signed)
Ordered. Thanks

## 2016-01-16 ENCOUNTER — Other Ambulatory Visit: Payer: Self-pay | Admitting: Internal Medicine

## 2016-01-16 ENCOUNTER — Other Ambulatory Visit: Payer: Self-pay | Admitting: Family Medicine

## 2016-01-16 NOTE — Telephone Encounter (Addendum)
Pt had a CPE on 01/15/16, last filled on 10/29/15 #180 with 0 refills (? If it's to soon), please advise

## 2016-01-16 NOTE — Assessment & Plan Note (Signed)
Likely multifactorial. Likely exacerbated by anemia. DISCUSSED with patient. At this point still okay for outpatient follow-up but does need GI evaluation. Continue iron for iron deficiency anemia in the meantime.

## 2016-01-16 NOTE — Assessment & Plan Note (Signed)
I asked her to talk to pulmonary about her incruse use and throat symptoms. She is clear to auscultation on exam today. It is likely some shortness of breath is related to COPD, but is more likely recently exacerbated by her anemia. Discussed with patient.

## 2016-01-16 NOTE — Telephone Encounter (Signed)
CXR results d/w pt.  Will arrange for CT chest with IV contrast.  She also reports worsening hoarseness since starting incruse.  Advised her to stop incruse and continue symbicort/proair.

## 2016-01-16 NOTE — Assessment & Plan Note (Signed)
She was previously recommended for colon cancer screening with colonoscopy by GI. Again recommended this a day. Refer to GI. Discussed with patient about pathophysiology of iron deficiency anemia and her recent labs, with low iron and low hemoglobin. Not at the point of needing transfusion.

## 2016-01-16 NOTE — Assessment & Plan Note (Signed)
Flu 2017 Shingles 2007 PNA up to date Tetanus 2012 Colonoscopy d/w pt.  See below.  Breast cancer screening= mammogram pending.  dxa pending.   Advance directive- husband and daughter Adah Salvage equally designated if patient were incapacitated.  Cognitive function addressed- see scanned forms- and if abnormal then additional documentation follows.

## 2016-01-17 NOTE — Telephone Encounter (Signed)
She should still have some left.  Okay to call in with fill on/after date.  Please call in.  Thanks.

## 2016-01-19 ENCOUNTER — Ambulatory Visit (INDEPENDENT_AMBULATORY_CARE_PROVIDER_SITE_OTHER)
Admission: RE | Admit: 2016-01-19 | Discharge: 2016-01-19 | Disposition: A | Discharge status: Home / Self Care | Payer: BLUE CROSS/BLUE SHIELD | Source: Ambulatory Visit | Attending: Pulmonary Disease | Admitting: Pulmonary Disease

## 2016-01-19 ENCOUNTER — Ambulatory Visit: Payer: BLUE CROSS/BLUE SHIELD | Admitting: Cardiovascular Disease

## 2016-01-19 DIAGNOSIS — R918 Other nonspecific abnormal finding of lung field: Secondary | ICD-10-CM

## 2016-01-19 MED ORDER — IOPAMIDOL (ISOVUE-300) INJECTION 61%
80.0000 mL | Freq: Once | INTRAVENOUS | Status: AC | PRN
  Administered 2016-01-19: 80 mL via INTRAVENOUS

## 2016-01-19 NOTE — Telephone Encounter (Signed)
Left refill on voice mail at pharmacy with directions to fill on or after 01-26-16

## 2016-01-21 ENCOUNTER — Ambulatory Visit (INDEPENDENT_AMBULATORY_CARE_PROVIDER_SITE_OTHER): Payer: BLUE CROSS/BLUE SHIELD | Admitting: Gastroenterology

## 2016-01-21 ENCOUNTER — Encounter: Payer: Self-pay | Admitting: Gastroenterology

## 2016-01-21 ENCOUNTER — Other Ambulatory Visit: Payer: Self-pay | Admitting: Family Medicine

## 2016-01-21 ENCOUNTER — Telehealth: Payer: Self-pay | Admitting: Pulmonary Disease

## 2016-01-21 ENCOUNTER — Telehealth: Payer: Self-pay

## 2016-01-21 VITALS — BP 138/70 | HR 80 | Ht 64.0 in | Wt 241.1 lb

## 2016-01-21 DIAGNOSIS — Z1211 Encounter for screening for malignant neoplasm of colon: Secondary | ICD-10-CM | POA: Diagnosis not present

## 2016-01-21 DIAGNOSIS — D509 Iron deficiency anemia, unspecified: Secondary | ICD-10-CM | POA: Diagnosis not present

## 2016-01-21 MED ORDER — NA SULFATE-K SULFATE-MG SULF 17.5-3.13-1.6 GM/177ML PO SOLN: 1.0000 | Freq: Once | ORAL | 0 refills | Status: AC

## 2016-01-21 NOTE — Patient Instructions (Signed)
You have been scheduled for an endoscopy and colonoscopy. Please follow the written instructions given to you at your visit today. Please pick up your prep supplies at the pharmacy within the next 1-3 days. If you use inhalers (even only as needed), please bring them with you on the day of your procedure. Your physician has requested that you go to www.[email or web address].[email or web address] and enter the access code given to you at your visit today. This web site gives a general overview about your procedure. However, you should still follow specific instructions given to you by our office regarding your preparation for the procedure.   I will contact you after I have heard back from Pulmonology.

## 2016-01-21 NOTE — Telephone Encounter (Signed)
CT chest 01/19/16 >> biapical scarring, centrilobular emphysema, 5 mm nodule RLL, 9 mm nodule RUL new, 5 mm nodule RUL, 4 mm nodule LLL   Results d/w pt.  She will need f/u CT chest w/o contrast in May 2018.

## 2016-01-21 NOTE — Telephone Encounter (Signed)
lmtcb for pt.  

## 2016-01-21 NOTE — Telephone Encounter (Signed)
Patient returning call. She can be reached at [phone] -pr

## 2016-01-21 NOTE — Telephone Encounter (Signed)
She is higher risk for respiratory complications from sedation due to COPD and OSA.  She can have procedure done, but should be done in hospital setting and not [facility].

## 2016-01-21 NOTE — Telephone Encounter (Signed)
  01/21/2016   RE: MAFALDA MCGINNISS DOB: 06-Nov-1935 MRN: [id number]   Dear Dr. Halford Chessman,    We have scheduled the above patient for an endoscopic procedure. Our records show she is followed by your office for her COPD.  Please advise if it is safe for her to be sedated and have this procedure.  Please fax back/ or route the completed form to [place] at [phone].   Sincerely,    Phillis Haggis

## 2016-01-21 NOTE — Telephone Encounter (Signed)
CT Chest done 11/13 Pt calling for the results Spoke with patient, she is aware message is being forwarded to VS for results VS please advise, thank you

## 2016-01-21 NOTE — Progress Notes (Addendum)
     01/21/2016 Alexandria [name] [id number] [birth year]   History of Present Illness:  This is a 80 year old female with iron deficiency anemia.  Has never undergone colonoscopy in the past. It was recommended in 1 year ago by Dr. Ardis Hughs that she have a EGD and colonoscopy, but she chose not to proceed at that time. She is here today to rediscuss and reschedule. She is on iron supplements 325 mg of ferrous sulfate daily.  Most recent Hgb is 9.6 grams.  Her hemoglobin had improved and actually one year ago was at 13.3 grams.  MCV is low again a 75.8. Iron low at 14 and percent saturation low at 3.3. She denies seeing any blood in her stool. She says that sometimes her stools are darker in color even before starting the iron supplements.  Has GERD that is well controlled on Nexium.  She is short of breath today and has pursed lips breathing. She has COPD/emphysema and follows with pulmonary, Dr. Halford Chessman.  She says that her breathing has worsened over the past several months, but actually slightly better most recently. She just saw him last week.  Not on O2 at home.   Current Medications, Allergies, Past Medical History, Past Surgical History, Family History and Social History were reviewed in [employer] record.   Physical Exam: BP 138/70   Pulse 80   Ht '5\' 4"'$  (1.626 m)   Wt 241 lb 2 oz (109.4 kg)   BMI 41.39 kg/m  General: Well developed white female in no acute distress; is purse-lip breathing and appears short of breath with just sitting. Head: Normocephalic and atraumatic Eyes:  Sclerae anicteric, conjunctiva pink  Ears: Normal auditory acuity Lungs:  Wheezing noted B/L.  Increased work of breathing. Heart: Regular rate and rhythm Abdomen: Soft, non-distended.  Normal bowel sounds.  Non-tender. Rectal:  Will be done at the time of colonoscopy. Musculoskeletal: Symmetrical with no gross deformities  Extremities: Compressions hose noted on legs.  Neurological: Alert  oriented x 4, grossly non-focal Psychological:  Alert and cooperative. Normal mood and affect  Assessment and Recommendations: -80 year old female with iron deficiency anemia:  Has never undergone colonoscopy in the past. It was recommended in 1 year ago by Dr. Ardis Hughs that she have EGD and colonoscopy, but she chose not to proceed at that time. She is here today to rediscuss and reschedule. She is on iron supplements 325 mg of ferrous sulfate daily. -Screening colonoscopy  *Just of note, she did appear quite short of breath at her visit today. Follows with Dr. Halford Chessman in pulmonary and just saw him recently. We'll get pulmonary clearance before proceeding with these procedures. She does not use oxygen.

## 2016-01-22 NOTE — Telephone Encounter (Signed)
Left message on machine to call back  

## 2016-01-22 NOTE — Progress Notes (Signed)
I agree with the above note, plan.  Dr. Halford Chessman recommends (see phone note) that she have procedure done at hospital.

## 2016-01-22 NOTE — Telephone Encounter (Signed)
She needs colonoscopy and EGD at [facility] with MAC sedation, next available EUS Thursday given her COPD.  For IDA.  Thanks

## 2016-01-23 ENCOUNTER — Other Ambulatory Visit: Payer: Self-pay

## 2016-01-23 DIAGNOSIS — D509 Iron deficiency anemia, unspecified: Secondary | ICD-10-CM

## 2016-01-23 NOTE — Telephone Encounter (Signed)
Colon/Endo scheduled, pt instructed and medications reviewed.  Patient instructions mailed to home.  Patient to call with any questions or concerns.

## 2016-02-05 DIAGNOSIS — H2513 Age-related nuclear cataract, bilateral: Secondary | ICD-10-CM | POA: Diagnosis not present

## 2016-02-05 DIAGNOSIS — E119 Type 2 diabetes mellitus without complications: Secondary | ICD-10-CM | POA: Diagnosis not present

## 2016-02-05 DIAGNOSIS — Z0100 Encounter for examination of eyes and vision without abnormal findings: Secondary | ICD-10-CM | POA: Diagnosis not present

## 2016-02-05 LAB — HM DIABETES EYE EXAM

## 2016-02-09 ENCOUNTER — Encounter (HOSPITAL_COMMUNITY): Payer: Self-pay

## 2016-02-10 ENCOUNTER — Encounter: Payer: Self-pay | Admitting: Family Medicine

## 2016-02-10 DIAGNOSIS — Z1231 Encounter for screening mammogram for malignant neoplasm of breast: Secondary | ICD-10-CM | POA: Diagnosis not present

## 2016-02-10 DIAGNOSIS — M85851 Other specified disorders of bone density and structure, right thigh: Secondary | ICD-10-CM | POA: Diagnosis not present

## 2016-02-11 ENCOUNTER — Encounter: Payer: Self-pay | Admitting: Family Medicine

## 2016-02-12 ENCOUNTER — Telehealth: Payer: Self-pay

## 2016-02-12 ENCOUNTER — Ambulatory Visit (HOSPITAL_COMMUNITY): Payer: BLUE CROSS/BLUE SHIELD | Admitting: Certified Registered"

## 2016-02-12 ENCOUNTER — Ambulatory Visit (HOSPITAL_COMMUNITY)
Admission: RE | Admit: 2016-02-12 | Discharge: 2016-02-12 | Disposition: A | Discharge status: Home / Self Care | Payer: BLUE CROSS/BLUE SHIELD | Source: Ambulatory Visit | Attending: Gastroenterology | Admitting: Gastroenterology

## 2016-02-12 ENCOUNTER — Encounter (HOSPITAL_COMMUNITY)
Admission: RE | Disposition: A | Discharge status: Home / Self Care | Payer: Self-pay | Source: Ambulatory Visit | Attending: Gastroenterology

## 2016-02-12 ENCOUNTER — Encounter (HOSPITAL_COMMUNITY): Payer: Self-pay | Admitting: Certified Registered"

## 2016-02-12 DIAGNOSIS — K5730 Diverticulosis of large intestine without perforation or abscess without bleeding: Secondary | ICD-10-CM | POA: Insufficient documentation

## 2016-02-12 DIAGNOSIS — J449 Chronic obstructive pulmonary disease, unspecified: Secondary | ICD-10-CM | POA: Diagnosis not present

## 2016-02-12 DIAGNOSIS — Z6839 Body mass index (BMI) 39.0-39.9, adult: Secondary | ICD-10-CM | POA: Diagnosis not present

## 2016-02-12 DIAGNOSIS — K649 Unspecified hemorrhoids: Secondary | ICD-10-CM | POA: Diagnosis not present

## 2016-02-12 DIAGNOSIS — D649 Anemia, unspecified: Secondary | ICD-10-CM | POA: Diagnosis not present

## 2016-02-12 DIAGNOSIS — I110 Hypertensive heart disease with heart failure: Secondary | ICD-10-CM | POA: Insufficient documentation

## 2016-02-12 DIAGNOSIS — D125 Benign neoplasm of sigmoid colon: Secondary | ICD-10-CM | POA: Diagnosis not present

## 2016-02-12 DIAGNOSIS — E119 Type 2 diabetes mellitus without complications: Secondary | ICD-10-CM | POA: Diagnosis not present

## 2016-02-12 DIAGNOSIS — I509 Heart failure, unspecified: Secondary | ICD-10-CM | POA: Diagnosis not present

## 2016-02-12 DIAGNOSIS — G4730 Sleep apnea, unspecified: Secondary | ICD-10-CM | POA: Diagnosis not present

## 2016-02-12 DIAGNOSIS — K219 Gastro-esophageal reflux disease without esophagitis: Secondary | ICD-10-CM | POA: Insufficient documentation

## 2016-02-12 DIAGNOSIS — K648 Other hemorrhoids: Secondary | ICD-10-CM | POA: Insufficient documentation

## 2016-02-12 DIAGNOSIS — K449 Diaphragmatic hernia without obstruction or gangrene: Secondary | ICD-10-CM

## 2016-02-12 DIAGNOSIS — Z87891 Personal history of nicotine dependence: Secondary | ICD-10-CM | POA: Insufficient documentation

## 2016-02-12 DIAGNOSIS — D509 Iron deficiency anemia, unspecified: Secondary | ICD-10-CM

## 2016-02-12 HISTORY — PX: COLONOSCOPY WITH PROPOFOL: SHX5780

## 2016-02-12 HISTORY — PX: ESOPHAGOGASTRODUODENOSCOPY (EGD) WITH PROPOFOL: SHX5813

## 2016-02-12 LAB — GLUCOSE, CAPILLARY: Glucose-Capillary: 113 mg/dL — ABNORMAL HIGH (ref 65–99)

## 2016-02-12 SURGERY — COLONOSCOPY WITH PROPOFOL
Anesthesia: Monitor Anesthesia Care

## 2016-02-12 MED ORDER — LIDOCAINE 2% (20 MG/ML) 5 ML SYRINGE
INTRAMUSCULAR | Status: AC
Start: 2016-02-12 — End: 2016-02-12
  Filled 2016-02-12: qty 5

## 2016-02-12 MED ORDER — PROPOFOL 10 MG/ML IV BOLUS
INTRAVENOUS | Status: AC
  Filled 2016-02-12: qty 20

## 2016-02-12 MED ORDER — PROPOFOL 10 MG/ML IV BOLUS
INTRAVENOUS | Status: DC | PRN
  Administered 2016-02-12 (×4): 50 mg via INTRAVENOUS
  Administered 2016-02-12: 100 mg via INTRAVENOUS

## 2016-02-12 MED ORDER — SODIUM CHLORIDE 0.9 % IV SOLN: INTRAVENOUS | Status: DC

## 2016-02-12 MED ORDER — LIDOCAINE HCL (CARDIAC) 20 MG/ML IV SOLN
INTRAVENOUS | Status: DC | PRN
  Administered 2016-02-12: 20 mg via INTRAVENOUS

## 2016-02-12 MED ORDER — LACTATED RINGERS IV SOLN
INTRAVENOUS | Status: DC
  Administered 2016-02-12: 1000 mL via INTRAVENOUS

## 2016-02-12 SURGICAL SUPPLY — 24 items

## 2016-02-12 NOTE — Anesthesia Preprocedure Evaluation (Signed)
Anesthesia Evaluation  Patient identified by MRN, date of birth, ID band Patient awake    Reviewed: Allergy & Precautions, H&P , NPO status , Patient's Chart, lab work & pertinent test results  Airway Mallampati: II  TM Distance: <3 FB Neck ROM: Full    Dental no notable dental hx. (+) Edentulous Upper   Pulmonary sleep apnea , COPD, former smoker,  Severe COPD, RA sat to 60% with movement from stretcher to OR table    + decreased breath sounds      Cardiovascular hypertension, +CHF  Normal cardiovascular exam+ Valvular Problems/Murmurs  Rhythm:Regular Rate:Normal     Neuro/Psych negative neurological ROS  negative psych ROS   GI/Hepatic negative GI ROS, Neg liver ROS, GERD  ,  Endo/Other  diabetes, Type 2Hypothyroidism Morbid obesity  Renal/GU negative Renal ROS  negative genitourinary   Musculoskeletal negative musculoskeletal ROS (+)   Abdominal   Peds negative pediatric ROS (+)  Hematology negative hematology ROS (+) anemia ,   Anesthesia Other Findings   Reproductive/Obstetrics negative OB ROS                             Anesthesia Physical  Anesthesia Plan  ASA: III  Anesthesia Plan: MAC   Post-op Pain Management:    Induction: Intravenous  Airway Management Planned: Natural Airway  Additional Equipment:   Intra-op Plan:   Post-operative Plan:   Informed Consent: I have reviewed the patients History and Physical, chart, labs and discussed the procedure including the risks, benefits and alternatives for the proposed anesthesia with the patient or authorized representative who has indicated his/her understanding and acceptance.   Dental advisory given  Plan Discussed with: CRNA  Anesthesia Plan Comments:         Anesthesia Quick Evaluation

## 2016-02-12 NOTE — Op Note (Signed)
[facility] Patient Name: Alexandria [name] Procedure Date: 02/12/2016 MRN: [id number] Attending MD: Milus Banister , MD Date of Birth: Jan 24, 1936 CSN: [id number] Age: 80 Admit Type: Outpatient Procedure:                Upper GI endoscopy Indications:              Iron deficiency anemia Providers:                Milus Banister, MD, Carolynn Comment, RN, Cletis Athens, Technician Referring MD:              Medicines:                Monitored Anesthesia Care Complications:            No immediate complications. Estimated blood loss:                            None. Estimated Blood Loss:     Estimated blood loss: none. Procedure:                Pre-Anesthesia Assessment:                           - Prior to the procedure, a History and Physical                            was performed, and patient medications and                            allergies were reviewed. The patient's tolerance of                            previous anesthesia was also reviewed. The risks                            and benefits of the procedure and the sedation                            options and risks were discussed with the patient.                            All questions were answered, and informed consent                            was obtained. Prior Anticoagulants: The patient has                            taken no previous anticoagulant or antiplatelet                            agents. ASA Grade Assessment: III - A patient with                            severe  systemic disease. After reviewing the risks                            and benefits, the patient was deemed in                            satisfactory condition to undergo the procedure.                           - Prior to the procedure, a History and Physical                            was performed, and patient medications and                            allergies were reviewed. The patient's  tolerance of                            previous anesthesia was also reviewed. The risks                            and benefits of the procedure and the sedation                            options and risks were discussed with the patient.                            All questions were answered, and informed consent                            was obtained. Prior Anticoagulants: The patient has                            taken no previous anticoagulant or antiplatelet                            agents. ASA Grade Assessment: III - A patient with                            severe systemic disease. After reviewing the risks                            and benefits, the patient was deemed in                            satisfactory condition to undergo the procedure.                           After obtaining informed consent, the endoscope was                            passed under direct vision. Throughout the  procedure, the patient's blood pressure, pulse, and                            oxygen saturations were monitored continuously. The                            EG-[id number] ([id number]) scope was introduced through the                            mouth, and advanced to the second part of duodenum.                            The upper GI endoscopy was accomplished without                            difficulty. The patient tolerated the procedure                            well. Scope In: Scope Out: Findings:      A small hiatal hernia was present.      The exam was otherwise without abnormality. Impression:               - Small hiatal hernia.                           - The examination was otherwise normal.                           - No specimens collected. Moderate Sedation:      N/A- Per Anesthesia Care Recommendation:           - Patient has a contact number available for                            emergencies. The signs and symptoms of potential                             delayed complications were discussed with the                            patient. Return to normal activities tomorrow.                            Written discharge instructions were provided to the                            patient.                           - Resume previous diet.                           - Continue present medications.                           - No repeat upper endoscopy.                           -  Given age and multiple comborbidities, will plan                            to follow blood counts for now while she remains on                            iron supplement once daily. If her anemia worsens                            despite daily iron then will consider further Gi                            testing.                           - My office will set up repeat cbc in 3 months. Procedure Code(s):        --- Professional ---                           [id number], Esophagogastroduodenoscopy, flexible,                            transoral; diagnostic, including collection of                            specimen(s) by brushing or washing, when performed                            (separate procedure) Diagnosis Code(s):        --- Professional ---                           K44.9, Diaphragmatic hernia without obstruction or                            gangrene                           D50.9, Iron deficiency anemia, unspecified CPT copyright 2016 American Medical Association. All rights reserved. The codes documented in this report are preliminary and upon coder review may  be revised to meet current compliance requirements. Milus Banister, MD 02/12/2016 11:30:22 AM This report has been signed electronically. Number of Addenda: 0

## 2016-02-12 NOTE — Interval H&P Note (Signed)
History and Physical Interval Note:  02/12/2016 10:11 AM  [name]  has presented today for surgery, with the diagnosis of IDA  The various methods of treatment have been discussed with the patient and family. After consideration of risks, benefits and other options for treatment, the patient has consented to  Procedure(s): COLONOSCOPY WITH PROPOFOL (N/A) ESOPHAGOGASTRODUODENOSCOPY (EGD) WITH PROPOFOL (N/A) as a surgical intervention .  The patient's history has been reviewed, patient examined, no change in status, stable for surgery.  I have reviewed the patient's chart and labs.  Questions were answered to the patient's satisfaction.     Alexandria [name]

## 2016-02-12 NOTE — Discharge Instructions (Signed)

## 2016-02-12 NOTE — Anesthesia Postprocedure Evaluation (Signed)
Anesthesia Post Note  Patient: Alexandria [name]  Procedure(s) Performed: Procedure(s) (LRB): COLONOSCOPY WITH PROPOFOL (N/A) ESOPHAGOGASTRODUODENOSCOPY (EGD) WITH PROPOFOL (N/A)  Patient location during evaluation: PACU Anesthesia Type: MAC Level of consciousness: awake and alert Pain management: pain level controlled Vital Signs Assessment: post-procedure vital signs reviewed and stable Respiratory status: spontaneous breathing Cardiovascular status: stable Anesthetic complications: no    Last Vitals:  Vitals:   02/12/16 1120 02/12/16 1130  BP: (!) 154/51 (!) 192/72  Pulse: 73 67  Resp: 17 16  Temp:      Last Pain:  Vitals:   02/12/16 1023  TempSrc: Oral                 Nolon Nations

## 2016-02-12 NOTE — H&P (View-Only) (Signed)
     01/21/2016 HADYN [name] [id number] 06-08-1935   History of Present Illness:  This is a 80 year old female with iron deficiency anemia.  Has never undergone colonoscopy in the past. It was recommended in 1 year ago by Dr. Ardis Hughs that she have a EGD and colonoscopy, but she chose not to proceed at that time. She is here today to rediscuss and reschedule. She is on iron supplements 325 mg of ferrous sulfate daily.  Most recent Hgb is 9.6 grams.  Her hemoglobin had improved and actually one year ago was at 13.3 grams.  MCV is low again a 75.8. Iron low at 14 and percent saturation low at 3.3. She denies seeing any blood in her stool. She says that sometimes her stools are darker in color even before starting the iron supplements.  Has GERD that is well controlled on Nexium.  She is short of breath today and has pursed lips breathing. She has COPD/emphysema and follows with pulmonary, Dr. Halford Chessman.  She says that her breathing has worsened over the past several months, but actually slightly better most recently. She just saw him last week.  Not on O2 at home.   Current Medications, Allergies, Past Medical History, Past Surgical History, Family History and Social History were reviewed in [employer] record.   Physical Exam: BP 138/70   Pulse 80   Ht '5\' 4"'$  (1.626 m)   Wt 241 lb 2 oz (109.4 kg)   BMI 41.39 kg/m  General: Well developed white female in no acute distress; is purse-lip breathing and appears short of breath with just sitting. Head: Normocephalic and atraumatic Eyes:  Sclerae anicteric, conjunctiva pink  Ears: Normal auditory acuity Lungs:  Wheezing noted B/L.  Increased work of breathing. Heart: Regular rate and rhythm Abdomen: Soft, non-distended.  Normal bowel sounds.  Non-tender. Rectal:  Will be done at the time of colonoscopy. Musculoskeletal: Symmetrical with no gross deformities  Extremities: Compressions hose noted on legs.  Neurological: Alert  oriented x 4, grossly non-focal Psychological:  Alert and cooperative. Normal mood and affect  Assessment and Recommendations: -80 year old female with iron deficiency anemia:  Has never undergone colonoscopy in the past. It was recommended in 1 year ago by Dr. Ardis Hughs that she have EGD and colonoscopy, but she chose not to proceed at that time. She is here today to rediscuss and reschedule. She is on iron supplements 325 mg of ferrous sulfate daily. -Screening colonoscopy  *Just of note, she did appear quite short of breath at her visit today. Follows with Dr. Halford Chessman in pulmonary and just saw him recently. We'll get pulmonary clearance before proceeding with these procedures. She does not use oxygen.

## 2016-02-12 NOTE — Op Note (Signed)
[facility] Patient Name: Alexandria [name] Procedure Date: 02/12/2016 MRN: [id number] Attending MD: Milus Banister , MD Date of Birth: [birth year] CSN: [id number] Age: 80 Admit Type: Outpatient Procedure:                Colonoscopy Indications:              Iron deficiency anemia Providers:                Milus Banister, MD, Carolynn Comment, RN, Cletis Athens, Technician Referring MD:              Medicines:                Monitored Anesthesia Care Complications:            No immediate complications. Estimated blood loss:                            None. Estimated Blood Loss:     Estimated blood loss: none. Procedure:                Pre-Anesthesia Assessment:                           - Prior to the procedure, a History and Physical                            was performed, and patient medications and                            allergies were reviewed. The patient's tolerance of                            previous anesthesia was also reviewed. The risks                            and benefits of the procedure and the sedation                            options and risks were discussed with the patient.                            All questions were answered, and informed consent                            was obtained. Prior Anticoagulants: The patient has                            taken no previous anticoagulant or antiplatelet                            agents. ASA Grade Assessment: III - A patient with                            severe systemic disease.  After reviewing the risks                            and benefits, the patient was deemed in                            satisfactory condition to undergo the procedure.                           After obtaining informed consent, the colonoscope                            was passed under direct vision. Throughout the                            procedure, the patient's blood pressure, pulse,  and                            oxygen saturations were monitored continuously. The                            EC-[id number] ([id number]) scope was introduced through                            the anus and advanced to the the cecum, identified                            by appendiceal orifice and ileocecal valve. The                            colonoscopy was performed without difficulty. The                            patient tolerated the procedure well. The quality                            of the bowel preparation was good. The ileocecal                            valve, appendiceal orifice, and rectum were                            photographed. Scope In: 22:63:33 AM Scope Out: 11:05:58 AM Scope Withdrawal Time: 0 hours 7 minutes 49 seconds  Total Procedure Duration: 0 hours 9 minutes 46 seconds  Findings:      Multiple small and large-mouthed diverticula were found in the left       colon.      There was a medium-sized pedunculated lipoma, in the sigmoid colon.      Internal hemorrhoids were found. The hemorrhoids were small.      The exam was otherwise without abnormality on direct and retroflexion       views. Impression:               - Diverticulosis in the left colon.                           -  Medium-sized pedunculated lipoma in the sigmoid                            colon.                           - Internal hemorrhoids.                           - The examination was otherwise normal on direct                            and retroflexion views.                           - No specimens collected. Moderate Sedation:      N/A- Per Anesthesia Care Recommendation:           - Patient has a contact number available for                            emergencies. The signs and symptoms of potential                            delayed complications were discussed with the                            patient. Return to normal activities tomorrow.                            Written  discharge instructions were provided to the                            patient.                           - Resume previous diet.                           - Continue present medications.                           You do not need any further colon cancer screening                            tests (including stool testing). These types of                            tests generally stop around age 20-80. Procedure Code(s):        --- Professional ---                           [phone], Colonoscopy, flexible; diagnostic, including                            collection of specimen(s) by brushing or washing,  when performed (separate procedure) Diagnosis Code(s):        --- Professional ---                           K64.8, Other hemorrhoids                           D17.5, Benign lipomatous neoplasm of                            intra-abdominal organs                           D50.9, Iron deficiency anemia, unspecified                           K57.30, Diverticulosis of large intestine without                            perforation or abscess without bleeding CPT copyright 2016 American Medical Association. All rights reserved. The codes documented in this report are preliminary and upon coder review may  be revised to meet current compliance requirements. Milus Banister, MD 02/12/2016 11:08:44 AM This report has been signed electronically. Number of Addenda: 0

## 2016-02-12 NOTE — Transfer of Care (Signed)
Immediate Anesthesia Transfer of Care Note  Patient: Alexandria [name]  Procedure(s) Performed: Procedure(s): COLONOSCOPY WITH PROPOFOL (N/A) ESOPHAGOGASTRODUODENOSCOPY (EGD) WITH PROPOFOL (N/A)  Patient Location: PACU  Anesthesia Type:MAC  Level of Consciousness:  sedated, patient cooperative and responds to stimulation  Airway & Oxygen Therapy:Patient Spontanous Breathing  Post-op Assessment:  Report given to PACU RN and Post -op Vital signs reviewed and stable  Post vital signs:  Reviewed and stable  Last Vitals:  Vitals:   02/12/16 1023  BP: (!) 146/56  Pulse: 71  Resp: 18  Temp: 05.6 C    Complications: No apparent anesthesia complications

## 2016-02-12 NOTE — Telephone Encounter (Signed)
-----   Message from Milus Banister, MD sent at 02/12/2016 11:33 AM EST ----- She needs cbc in 3 months.  Thanks

## 2016-02-13 ENCOUNTER — Encounter (HOSPITAL_COMMUNITY): Payer: Self-pay | Admitting: Gastroenterology

## 2016-02-13 ENCOUNTER — Encounter: Payer: Self-pay | Admitting: *Deleted

## 2016-02-13 NOTE — Telephone Encounter (Signed)
Letter mailed to remind pt to have labs

## 2016-02-17 ENCOUNTER — Telehealth: Payer: Self-pay

## 2016-02-17 ENCOUNTER — Encounter: Payer: Self-pay | Admitting: Family Medicine

## 2016-02-17 NOTE — Telephone Encounter (Signed)
Pt left v/m; pt is waiting on results of colonoscopy,endoscopy,bone density and mammogram that were done last week. Pt request cb to know what is the next thing to do about anemic iron deficiency. Pt is presently taking iron 325 (pt is taking 3 tabs daily). Pt request cb today so she can stop worrying.

## 2016-02-17 NOTE — Telephone Encounter (Signed)
Patient advised.

## 2016-02-17 NOTE — Telephone Encounter (Signed)
Endoscopy per GI clinic.   They should be contacting her.  She can contact them for details.  Per GI: Given age and multiple comborbidities, will plan to follow blood counts for now while she remains on iron supplement once daily. If her anemia worsens despite daily iron then will consider further Gi testing. GI office will set up repeat cbc in 3 months. Letter already sent about normal mammogram.  I just got her DXA report today and haven't had to time to review it.

## 2016-02-23 ENCOUNTER — Encounter: Payer: Self-pay | Admitting: *Deleted

## 2016-03-09 ENCOUNTER — Telehealth: Payer: Self-pay | Admitting: Family Medicine

## 2016-03-09 NOTE — Telephone Encounter (Signed)
FYI:  Refer to team health triage note.  Patient has an appointment to see Tor Netters, NP tomorrow 03/10/16 at 9:00am for acute concerns.

## 2016-03-09 NOTE — Telephone Encounter (Signed)
Patient Name: Alexandria [name]  DOB: 04-23-1935    Initial Comment Caller has copd and has chest congestion, and a cold and wants to make an appointment with the office.    Nurse Assessment      Guidelines    Guideline Title Affirmed Question Affirmed Notes       Final Disposition User   FINAL ATTEMPT MADE - message left Raphael Gibney, RN, Vera    Comments  Called secondary number and left message. Will try primary number.  called primary number and left message. Will try secondary number in a few min.

## 2016-03-10 ENCOUNTER — Encounter: Payer: Self-pay | Admitting: Family Medicine

## 2016-03-10 ENCOUNTER — Ambulatory Visit (INDEPENDENT_AMBULATORY_CARE_PROVIDER_SITE_OTHER): Payer: BLUE CROSS/BLUE SHIELD | Admitting: Family Medicine

## 2016-03-10 VITALS — BP 120/78 | HR 77 | Temp 97.7°F | Wt 243.0 lb

## 2016-03-10 DIAGNOSIS — J441 Chronic obstructive pulmonary disease with (acute) exacerbation: Secondary | ICD-10-CM | POA: Diagnosis not present

## 2016-03-10 MED ORDER — BENZONATATE 100 MG PO CAPS: 100.0000 mg | ORAL_CAPSULE | Freq: Three times a day (TID) | ORAL | 0 refills | Status: DC | PRN

## 2016-03-10 MED ORDER — DOXYCYCLINE HYCLATE 100 MG PO CAPS: 100.0000 mg | ORAL_CAPSULE | Freq: Two times a day (BID) | ORAL | 0 refills | Status: DC

## 2016-03-10 NOTE — Progress Notes (Signed)
Subjective:    Patient ID: Alexandria [name], female    DOB: Jul 03, 1935, 81 y.o.   MRN: [id number]  HPI This is a 81 yo female who presents today with chest and head congestion. Started several weeks ago and has gotten worse over last 5 days. Some wheezing, some SOB with activity. Doesn't think she has been running a fever. Known COPD, checks pulse ox at home, no change. Has taken some Advil. Congestion and cough most bothersome. Legs are swollen. [place] 10 days. Poor water intake. No sick contacts. Using albuterol inhaler up to 2x/ day.   Past Medical History:  Diagnosis Date  . Allergy, unspecified not elsewhere classified   . Chronic diastolic congestive heart failure ([place])   . COPD (chronic obstructive pulmonary disease) ([place]) 03/08/1998   PFTs 12/12/2002 FEV 1 1.42 (64%) ratio 58 with no better after B2 and DLCO75%; PFTs 11/20/09 FEV1 1.50 (73%) ratio 50 no better after B2 with DLCO 62%; Hfa 50% 11/20/2009 >75%, 01/13/10 p coaching  . Depression 12/07/1998  . Diabetes mellitus type II 02/05/2006   Dr. Elyse Hsu with endo  . Disorders of bursae and tendons in shoulder region, unspecified    Rotator cuff syndrome, right  . E. coli bacteremia   . Esophagitis   . GERD (gastroesophageal reflux disease)   . History of UTI   . Hyperlipidemia 10/04/2000  . Hypertension 03/08/1992  . Hypothyroidism 03/08/1968  . Iron deficiency anemia   . Obesity    NOS  . OSA (obstructive sleep apnea)    PSG 01/27/10 AHI 13, pt does not know CPAP settings  . Peripheral neuropathy ([facility])    Likely due to DM per Dr. Erling Cruz  . Ventral hernia    Past Surgical History:  Procedure Laterality Date  . ABD U/S  03/19/1999   Nml x2 foci in liver  . ADENOSINE MYOVIEW  06/02/2007   Nml  . CARDIOLITE PERSANTINE  08/24/2000   Nml  . CAROTID U/S  08/24/2000   1-39% ICA stenosis  . CAROTID U/S  06/02/2007   No apprec change   . CARPAL TUNNEL RELEASE  12/1997   Right   . CESAREAN SECTION     x2 Breech/ repeat  . CHOLECYSTECTOMY  1997  . COLONOSCOPY WITH PROPOFOL N/A 02/12/2016   Procedure: COLONOSCOPY WITH PROPOFOL;  Surgeon: Milus Banister, MD;  Location: [facility];  Service: Endoscopy;  Laterality: N/A;  . CT ABD W & PELVIS WO/W CM     Abd hemangiomas of liver, 1 cm R renal cyst  . DENTAL SURGERY  2016   Implants  . DEXA  07/03/2003   Nml  . ESOPHAGOGASTRODUODENOSCOPY  12/05/1997   Nml (due to hoarseness)  . ESOPHAGOGASTRODUODENOSCOPY (EGD) WITH PROPOFOL N/A 02/12/2016   Procedure: ESOPHAGOGASTRODUODENOSCOPY (EGD) WITH PROPOFOL;  Surgeon: Milus Banister, MD;  Location: [facility];  Service: Endoscopy;  Laterality: N/A;  . GALLBLADDER SURGERY    . HERNIA REPAIR  01/24/2009   Lap Ventr w/ Lysis of adhesions (Dr. Donne Hazel)  . knee arthroscopic surgery  years ago   right  . ROTATOR CUFF REPAIR  1984   Right, [place]  . SHOULDER OPEN ROTATOR CUFF REPAIR  02/08/2012   Procedure: ROTATOR CUFF REPAIR SHOULDER OPEN;  Surgeon: Magnus Sinning, MD;  Location: [facility];  Service: Orthopedics;  Laterality: Left;  Left Shoulder Open Anterior Acrominectomy Rotator Cuff Repair Open Distal Clavicle Resection ,tissue mend graft, and repair  of biceps tendon  . THUMB RELEASE  12/1997   Right  . TONSILLECTOMY    . TOTAL ABDOMINAL HYSTERECTOMY  1985   Due to dysmennorhea  . US ECHOCARDIOGRAPHY  06/02/2007   Family History  Problem Relation Age of Onset  . Heart disease Mother   . Thyroid disease Mother   . Emphysema Father     One lung  . Breast cancer Sister   . Cystic fibrosis Sister   . Hyperthyroidism Sister   . Osteoarthritis Brother   . Hyperthyroidism Brother   . Esophageal cancer Neg Hx   . Cancer Neg Hx     Head or neck   Social History  Substance Use Topics  . Smoking status: Former Smoker    Packs/day: 2.00    Years: 50.00    Types: Cigarettes    Quit date: 02/05/2005  . Smokeless tobacco: Never Used  . Alcohol use 1.2 oz/week      2 Standard drinks or equivalent per week     Comment: occasional       Review of Systems Per HPI    Objective:   Physical Exam  Constitutional: She is oriented to person, place, and time. She appears well-developed and well-nourished. No distress.  HENT:  Head: Normocephalic and atraumatic.  Right Ear: External ear normal.  Left Ear: External ear normal.  Nose: Nose normal.  Mouth/Throat: Oropharynx is clear and moist.  Eyes: Conjunctivae are normal.  Neck: Normal range of motion. Neck supple.  Cardiovascular: Normal rate, regular rhythm and normal heart sounds.   Pulmonary/Chest: Effort normal.  Few rhonchi in bases. Upper airway wheezes, cleared with cough (nonproductive).   Lymphadenopathy:    She has no cervical adenopathy.  Neurological: She is alert and oriented to person, place, and time.  Skin: Skin is warm and dry. She is not diaphoretic.  Psychiatric: She has a normal mood and affect. Her behavior is normal. Judgment and thought content normal.  Vitals reviewed.     BP 120/78   Pulse 77   Temp 97.7 F (36.5 C)   Wt 243 lb (110.2 kg)   SpO2 99%   BMI 41.71 kg/m  Wt Readings from Last 3 Encounters:  03/10/16 243 lb (110.2 kg)  02/12/16 231 lb (104.8 kg)  01/21/16 241 lb 2 oz (109.4 kg)       Assessment & Plan:  1. COPD exacerbation ([place]) - given length of symptoms, increased cough, will treat with antibiotic - follow up with pulmonary as scheduled next week - otc mucinex, saline nasal spray prn - RTC precautions reviewed - doxycycline (VIBRAMYCIN) 100 MG capsule; Take 1 capsule (100 mg total) by mouth 2 (two) times daily.  Dispense: 14 capsule; Refill: 0 - benzonatate (TESSALON) 100 MG capsule; Take 1 capsule (100 mg total) by mouth 3 (three) times daily as needed for cough.  Dispense: 20 capsule; Refill: 0   Clarene Reamer, FNP-BC  [facility]  03/10/2016 9:39 AM

## 2016-03-10 NOTE — Patient Instructions (Addendum)
Use albuterol inhaler every 4-6 hours as needed for cough, chest tightness Increase fluids until urine is light yellow Add plain Mucinex (guiafenesin) to help thin your mucus Please call office if not better in two days

## 2016-03-16 ENCOUNTER — Ambulatory Visit: Payer: BLUE CROSS/BLUE SHIELD | Admitting: Adult Health

## 2016-03-18 ENCOUNTER — Telehealth: Payer: Self-pay

## 2016-03-18 NOTE — Telephone Encounter (Signed)
Pt stated she takes musinex three times a day, she said she drinks water, tea, coffee and ginger ale throguhou the day. She said her breath has improved but she's still coughing a lot.

## 2016-03-18 NOTE — Telephone Encounter (Signed)
Pt left /vm; pt was seen 03/10/16. Pt has finished abx and still has congestion in chest; gets occasional prod cough but usually does not cough up phlegm.Pt wants to know what to do now. Pt request cb 03/18/16.

## 2016-03-18 NOTE — Telephone Encounter (Signed)
Please call patient to find out if she has been taking Mucinex? How is her fluid intake? Is her shortness of breath improved?

## 2016-03-19 ENCOUNTER — Encounter: Payer: BLUE CROSS/BLUE SHIELD | Admitting: Gastroenterology

## 2016-03-19 NOTE — Telephone Encounter (Signed)
LMTRC

## 2016-03-19 NOTE — Telephone Encounter (Signed)
Please tell her that the cough can persist for several weeks, it is reassuring that her breathing is better. Are the tessalon pearls helping with cough?

## 2016-03-19 NOTE — Telephone Encounter (Signed)
Patient said the tessalon pearls because the only time she coughs is when she has to bring mucous up from her chest.  Patient's temperature has been running 96.6 and 97.

## 2016-03-22 NOTE — Telephone Encounter (Signed)
No labs prior to appt

## 2016-03-22 NOTE — Telephone Encounter (Signed)
Pt notified per Sanda Linger

## 2016-03-23 ENCOUNTER — Encounter: Payer: Self-pay | Admitting: Adult Health

## 2016-03-23 ENCOUNTER — Ambulatory Visit (INDEPENDENT_AMBULATORY_CARE_PROVIDER_SITE_OTHER): Payer: BLUE CROSS/BLUE SHIELD | Admitting: Adult Health

## 2016-03-23 DIAGNOSIS — Z9989 Dependence on other enabling machines and devices: Secondary | ICD-10-CM

## 2016-03-23 DIAGNOSIS — G4733 Obstructive sleep apnea (adult) (pediatric): Secondary | ICD-10-CM

## 2016-03-23 DIAGNOSIS — L570 Actinic keratosis: Secondary | ICD-10-CM | POA: Diagnosis not present

## 2016-03-23 DIAGNOSIS — L821 Other seborrheic keratosis: Secondary | ICD-10-CM | POA: Diagnosis not present

## 2016-03-23 DIAGNOSIS — J449 Chronic obstructive pulmonary disease, unspecified: Secondary | ICD-10-CM | POA: Diagnosis not present

## 2016-03-23 DIAGNOSIS — D485 Neoplasm of uncertain behavior of skin: Secondary | ICD-10-CM | POA: Diagnosis not present

## 2016-03-23 DIAGNOSIS — Z23 Encounter for immunization: Secondary | ICD-10-CM | POA: Diagnosis not present

## 2016-03-23 NOTE — Assessment & Plan Note (Signed)
COPD -symptomatic  Trial of Incuse again, mouth care instructions  Plan  Patient Instructions  Retry INCRUSE Inhaler 1 puff daily - slow inhalation, then brush/rinse and gargle after use. Drink water after this .  Continue on Symbicort 2 puffs Twice daily  (slow breaths in) brush/rinse and gargle . Drink water after this . Wear CPAP At bedtime   Good luck with new diet.  follow up Dr. Halford Chessman in 3 months and As needed

## 2016-03-23 NOTE — Assessment & Plan Note (Signed)
CPAP compliance  Wear each night for >4hr  Wt loss

## 2016-03-23 NOTE — Progress Notes (Signed)
$'@Patient'x$  ID: Alexandria [name], female    DOB: [birth year], 81 y.o.   MRN: [id number]  Chief Complaint  Patient presents with  . Follow-up    COPD     Referring provider: Tonia Ghent, MD  HPI: 81 year old female former smoker followed for COPD, diastolic congestive heart failure, obstructive sleep apnea  TEST  PSG 01/27/10 >> AHI 13, SpO2 low 73% ONO with CPAP 08/09/14 >> Test time 9 hr 16 min. Mean SpO2 92.2%, low SpO2 86%. Spent 10 min with SpO2 < 88%   Pulmonary tests RAST 10/02/10 >> negative, IgE 8.1 A1AT 04/15/11 >> MM PFT 10/512 >> FEV1 1.29 (61%), FEV1% 57, TLC 6.01 (119%), RV 3.52 (153%), DLCO 67%, no BD PFT 08/15/14 >> FEV1 1.07 (57%), FEV1% 55, TLC 6.49 (128%), DLCO 47%, no BD  Cardiac tests Echo 09/28/14 >> mild LVH, EF 55 to 60%  03/23/2016 Follow up : COPD  Pt returns for 2 month follow up for COPD  Says she is doing okay with breathing, gets winded with prolonged walking or bending .  Was started on INCRUSE  Last ov, but could not tolerate it due to hoarseness  We discussed mouth care /inhaler use . She would like to restart and try it again.   Has OSA on CPAP . Does not wear it all the time, wears 2-3 times a week.  We discussed compliance and importance of daily use.  She is starting new diet to lose weight .    Allergies  Allergen Reactions  . Antihistamines, Diphenhydramine-Type Other (See Comments)    Throat closes up.  Does not take any antihistamines.  . Sulfonamide Derivatives Swelling    REACTION: closed throat  . Incruse Ellipta [Umeclidinium Bromide] Cough    Caused voice change and severe coughing  . Clarithromycin Other (See Comments)    REACTION: diff swallowing and mouth blisters  . Codeine Other (See Comments)    Unknown   . Spiriva Handihaler [Tiotropium Bromide Monohydrate] Other (See Comments)    Voice changes    Immunization History  Administered Date(s) Administered  . DT 12/23/2010  . H1N1 05/30/2008  . Influenza Split  12/23/2010, 12/16/2011, 01/08/2013  . Influenza Whole 12/07/1995, 12/07/2005, 01/04/2008, 01/06/2010  . Influenza, High Dose Seasonal PF 01/06/2016  . Influenza,inj,Quad PF,36+ Mos 12/26/2013, 12/05/2014  . PPD Test 10/02/2014  . Pneumococcal Conjugate-13 08/17/2013  . Pneumococcal Polysaccharide-23 12/06/2000, 03/08/2005, 10/24/2008  . Td 03/08/1993, 10/04/2000, 12/23/2010  . Zoster 12/07/2005    Past Medical History:  Diagnosis Date  . Allergy, unspecified not elsewhere classified   . Chronic diastolic congestive heart failure ([place])   . COPD (chronic obstructive pulmonary disease) ([place]) 03/08/1998   PFTs 12/12/2002 FEV 1 1.42 (64%) ratio 58 with no better after B2 and DLCO75%; PFTs 11/20/09 FEV1 1.50 (73%) ratio 50 no better after B2 with DLCO 62%; Hfa 50% 11/20/2009 >75%, 01/13/10 p coaching  . Depression 12/07/1998  . Diabetes mellitus type II 02/05/2006   Dr. Elyse Hsu with endo  . Disorders of bursae and tendons in shoulder region, unspecified    Rotator cuff syndrome, right  . E. coli bacteremia   . Esophagitis   . GERD (gastroesophageal reflux disease)   . History of UTI   . Hyperlipidemia 10/04/2000  . Hypertension 03/08/1992  . Hypothyroidism 03/08/1968  . Iron deficiency anemia   . Obesity    NOS  . OSA (obstructive sleep apnea)    PSG 01/27/10 AHI 13, pt does not know CPAP settings  .  Peripheral neuropathy ([facility])    Likely due to DM per Dr. Erling Cruz  . Ventral hernia     Tobacco History: History  Smoking Status  . Former Smoker  . Packs/day: 2.00  . Years: 50.00  . Types: Cigarettes  . Quit date: 02/05/2005  Smokeless Tobacco  . Never Used   Counseling given: Not Answered   Outpatient Encounter Prescriptions as of 03/23/2016  Medication Sig  . Ascorbic Acid (VITAMIN C PO) Take 1 tablet by mouth daily.  . benzonatate (TESSALON) 100 MG capsule Take 1 capsule (100 mg total) by mouth 3 (three) times daily as needed for cough.  . Coenzyme Q-10 200 MG CAPS Take 200  mg by mouth daily.   Marland Kitchen EPINEPHrine (EPIPEN) 0.3 mg/0.3 mL DEVI Inject 0.3 mg into the muscle once as needed (anaphylaxis).   Marland Kitchen esomeprazole (NEXIUM) 40 MG capsule Take 40 mg by mouth daily at 12 noon.  . ezetimibe (ZETIA) 10 MG tablet Take 1 tablet (10 mg total) by mouth at bedtime.  . ferrous sulfate 325 (65 FE) MG EC tablet Take 1 tablet (325 mg total) by mouth daily with breakfast.  . FLUoxetine (PROZAC) 10 MG capsule TAKE 3 CAPSULES IN THE MORNING AND TAKE 3 CAPSULES IN THE EVENING  . fluticasone (FLONASE) 50 MCG/ACT nasal spray USE 2 SPRAYS INTO THE NOSE EVERY 12 HOURS AS NEEDED FOR STUFFY NOSE  . furosemide (LASIX) 20 MG tablet Take 1 tablet (20 mg total) by mouth daily as needed. (Patient taking differently: Take 20 mg by mouth daily as needed for fluid. )  . ibuprofen (ADVIL,MOTRIN) 200 MG tablet Take 200-400 mg by mouth every 6 (six) hours as needed for moderate pain.   Marland Kitchen LORazepam (ATIVAN) 0.5 MG tablet TAKE 1 TABLET BY MOUTH TWICE A DAY AS NEEDED (Patient taking differently: TAKE 1 TABLET BY MOUTH TWICE A DAY AS NEEDED FOR ANXIETY)  . metFORMIN (GLUCOPHAGE) 500 MG tablet Take 1 tablet (500 mg total) by mouth daily with supper.  . nitroGLYCERIN (NITROSTAT) 0.4 MG SL tablet Place 1 tablet (0.4 mg total) under the tongue every 5 (five) minutes as needed for chest pain.  . potassium chloride (K-DUR) 10 MEQ tablet Take 1 tablet (10 mEq total) by mouth daily as needed. (Patient taking differently: Take 10 mEq by mouth daily as needed (Takes with furosemide doses.). )  . PROAIR HFA 108 (90 Base) MCG/ACT inhaler INHALE 2 PUFFS INTO THE LUNGS EVERY 6 HOURS AS NEEDED FOR WHEEZING OR SHORTNESS OF BREATH  . rosuvastatin (CRESTOR) 10 MG tablet Take 1 tablet (10 mg total) by mouth every other day.  . SYMBICORT 160-4.5 MCG/ACT inhaler INHALE 2 PUFFS FIRST THING IN THE MORNING AND 2 PUFFS AGAIN IN THE EVENING ABOUT 12 HOURS LATER  . SYNTHROID 125 MCG tablet TAKE 1 TABLET (125 MCG TOTAL) BY MOUTH DAILY  BEFORE BREAKFAST.  Marland Kitchen umeclidinium bromide (INCRUSE ELLIPTA) 62.5 MCG/INH AEPB Inhale 1 puff into the lungs daily.  . valsartan (DIOVAN) 160 MG tablet Take 1 tablet (160 mg total) by mouth 2 (two) times daily.  . vitamin B-12 (CYANOCOBALAMIN) 50 MCG tablet Take 50 mcg by mouth daily.   . [DISCONTINUED] doxycycline (VIBRAMYCIN) 100 MG capsule Take 1 capsule (100 mg total) by mouth 2 (two) times daily.   No facility-administered encounter medications on file as of 03/23/2016.      Review of Systems  Constitutional:   No  weight loss, night sweats,  Fevers, chills,  +fatigue, or  lassitude.  HEENT:  No headaches,  Difficulty swallowing,  Tooth/dental problems, or  Sore throat,                No sneezing, itching, ear ache, nasal congestion, post nasal drip,   CV:  No chest pain,  Orthopnea, PND, swelling in lower extremities, anasarca, dizziness, palpitations, syncope.   GI  No heartburn, indigestion, abdominal pain, nausea, vomiting, diarrhea, change in bowel habits, loss of appetite, bloody stools.   Resp:   No chest wall deformity  Skin: no rash or lesions.  GU: no dysuria, change in color of urine, no urgency or frequency.  No flank pain, no hematuria   MS:  No joint pain or swelling.  No decreased range of motion.  No back pain.    Physical Exam  BP 110/60   Pulse 81   Ht '5\' 4"'$  (1.626 m)   Wt 243 lb 12.8 oz (110.6 kg)   SpO2 97%   BMI 41.85 kg/m   GEN: A/Ox3; pleasant , NAD, chronically ill appearing , obese    HEENT:  [place]/AT,  EACs-clear, TMs-wnl, NOSE-clear, THROAT-clear, no lesions, no postnasal drip or exudate noted. Class 2 MP airway   NECK:  Supple w/ fair ROM; no JVD; normal carotid impulses w/o bruits; no thyromegaly or nodules palpated; no lymphadenopathy.    RESP  Decreased BS in bases  w/o, wheezes/ rales/ or rhonchi. no accessory muscle use, no dullness to percussion  CARD:  RRR, no m/r/g, no peripheral edema, pulses intact, no cyanosis or  clubbing.  GI:   Soft & nt; nml bowel sounds; no organomegaly or masses detected.   Musco: Warm bil, no deformities or joint swelling noted.   Neuro: alert, no focal deficits noted.    Skin: Warm, no lesions or rashes  Psych:  No change in mood or affect. No depression or anxiety.  No memory loss.  Lab Results:  CBC    Component Value Date/Time   WBC 9.0 01/06/2016 1554   RBC 4.04 01/06/2016 1554   HGB 9.6 (L) 01/06/2016 1554   HCT 30.6 (L) 01/06/2016 1554   PLT 284.0 01/06/2016 1554   MCV 75.8 (L) 01/06/2016 1554   MCH 23.1 (L) 10/01/2014 0326   MCHC 31.5 01/06/2016 1554   RDW 17.4 (H) 01/06/2016 1554   LYMPHSABS 1.7 01/06/2016 1554   MONOABS 0.4 01/06/2016 1554   EOSABS 0.1 01/06/2016 1554   BASOSABS 0.1 01/06/2016 1554    BMET    Component Value Date/Time   NA 140 01/01/2016 1406   K 4.8 01/01/2016 1406   CL 104 01/01/2016 1406   CO2 28 01/01/2016 1406   GLUCOSE 120 (H) 01/01/2016 1406   BUN 24 (H) 01/01/2016 1406   CREATININE 1.16 01/01/2016 1406   CALCIUM 9.3 01/01/2016 1406   GFRNONAA 42 (L) 10/02/2014 0544   GFRAA 48 (L) 10/02/2014 0544    BNP No results found for: BNP  ProBNP    Component Value Date/Time   PROBNP 34.0 08/24/2012 1621    Imaging: No results found.   Assessment & Plan:   COPD GOLD II COPD -symptomatic  Trial of Incuse again, mouth care instructions  Plan  Patient Instructions  Retry INCRUSE Inhaler 1 puff daily - slow inhalation, then brush/rinse and gargle after use. Drink water after this .  Continue on Symbicort 2 puffs Twice daily  (slow breaths in) brush/rinse and gargle . Drink water after this . Wear CPAP At bedtime   Good luck with new diet.  follow  up Dr. Halford Chessman in 3 months and As needed       OSA on CPAP CPAP compliance  Wear each night for >4hr  Wt loss      Rexene Edison, NP 03/23/2016

## 2016-03-23 NOTE — Patient Instructions (Signed)
Retry INCRUSE Inhaler 1 puff daily - slow inhalation, then brush/rinse and gargle after use. Drink water after this .  Continue on Symbicort 2 puffs Twice daily  (slow breaths in) brush/rinse and gargle . Drink water after this . Wear CPAP At bedtime   Good luck with new diet.  follow up Dr. Halford Chessman in 3 months and As needed

## 2016-03-25 NOTE — Progress Notes (Signed)
I have reviewed and agree with assessment/plan.  Chesley Mires, MD [facility] Pulmonary/Critical Care 03/25/2016, 5:44 PM Pager:  [phone]

## 2016-04-06 ENCOUNTER — Telehealth: Payer: Self-pay | Admitting: Family Medicine

## 2016-04-06 NOTE — Telephone Encounter (Signed)
Patient Name: Alexandria [name]  DOB: August 10, 1935    Initial Comment Caller states wants RX for UTI, having pain and frequency;    Nurse Assessment  Nurse: Thad Ranger RN, Denise Date/Time (Eastern Time): 04/06/2016 1:33:39 PM  Confirm and document reason for call. If symptomatic, describe symptoms. ---Caller states wants RX for UTI, having pain and frequency  Does the patient have any new or worsening symptoms? ---Yes  Will a triage be completed? ---Yes  Related visit to physician within the last 2 weeks? ---No  Does the PT have any chronic conditions? (i.e. diabetes, asthma, etc.) ---Yes  List chronic conditions. ---Diabetes, COPD  Is this a behavioral health or substance abuse call? ---No     Guidelines    Guideline Title Affirmed Question Affirmed Notes  Urination Pain - Female Diabetes mellitus or weak immune system (e.g., HIV positive, cancer chemotherapy, transplant patient)    Final Disposition User   See Physician within 4 Hours (or PCP triage) Carmon, RN, Langley Gauss    Comments  States she spoke to someone at the [facility] and they could not get her in today and advised her to go to an [facility] "all the way across town, and I'm not doing that." States she is taking OTC bladder pain relief, and if not better by next Mon, she will cb. Based on RN clinical knowledge, advised to drink 8-10, 8oz glasses of water/day   Referrals  GO TO FACILITY REFUSED   Disagree/Comply: Disagree  Disagree/Comply Reason: Disagree with instructions

## 2016-04-06 NOTE — Telephone Encounter (Signed)
Spoke to patient and was advised that she went to the pharmacy and the pharmacist recommended something over the counter to take. Patient stated that she will be taking that and pushing fluids. Appointment scheduled with you tomorrow 04/07/16 at 4:00.

## 2016-04-06 NOTE — Telephone Encounter (Signed)
Okay to add on tomorrow if needed. Thanks.

## 2016-04-07 ENCOUNTER — Encounter: Payer: Self-pay | Admitting: Family Medicine

## 2016-04-07 ENCOUNTER — Ambulatory Visit (INDEPENDENT_AMBULATORY_CARE_PROVIDER_SITE_OTHER): Payer: BLUE CROSS/BLUE SHIELD | Admitting: Family Medicine

## 2016-04-07 VITALS — BP 140/68 | HR 82 | Wt 237.8 lb

## 2016-04-07 DIAGNOSIS — E1140 Type 2 diabetes mellitus with diabetic neuropathy, unspecified: Secondary | ICD-10-CM

## 2016-04-07 DIAGNOSIS — R350 Frequency of micturition: Secondary | ICD-10-CM | POA: Insufficient documentation

## 2016-04-07 DIAGNOSIS — E119 Type 2 diabetes mellitus without complications: Secondary | ICD-10-CM | POA: Diagnosis not present

## 2016-04-07 LAB — POC URINALSYSI DIPSTICK (AUTOMATED)
Bilirubin, UA: NEGATIVE
Glucose, UA: NEGATIVE
Ketones, UA: NEGATIVE
Leukocytes, UA: NEGATIVE
Nitrite, UA: NEGATIVE
Spec Grav, UA: >=1.03
Urobilinogen, UA: NEGATIVE
pH, UA: 6

## 2016-04-07 MED ORDER — CIPROFLOXACIN HCL 250 MG PO TABS: 250.0000 mg | ORAL_TABLET | Freq: Two times a day (BID) | ORAL | 0 refills | Status: DC

## 2016-04-07 NOTE — Assessment & Plan Note (Signed)
Likely cystitis, cipro, ucx pending, u/a d/w pt.  Nontoxic.  See AVS.

## 2016-04-07 NOTE — Patient Instructions (Addendum)
Drink plenty of water and start the antibiotics today.  We'll contact you with your lab report.  Take care.   

## 2016-04-07 NOTE — Progress Notes (Signed)
Dysuria: yes, burning, urgency, frequency.   duration of symptoms: about 2 weeks,  abdominal pain: no fevers:no back pain:no vomiting:no U/a d/w pt.   She is not yet due for f/u CBC per GI notes.   She missed her lab appointment, per patient report, I offered to get A1c done today on the way out prior to endo appointment.    Meds, vitals, and allergies reviewed.   Per HPI unless specifically indicated in ROS section   GEN: nad, alert and oriented HEENT: mucous membranes moist NECK: supple CV: rrr.  PULM: ctab, no inc wob ABD: soft, +bs, suprapubic area not tender EXT: no edema BACK: no CVA pain

## 2016-04-07 NOTE — Assessment & Plan Note (Signed)
I agreed to get A1c done at [place] [facility] today prior to endo visit tomorrow.  App endo help.

## 2016-04-08 ENCOUNTER — Encounter: Payer: Self-pay | Admitting: Internal Medicine

## 2016-04-08 ENCOUNTER — Ambulatory Visit (INDEPENDENT_AMBULATORY_CARE_PROVIDER_SITE_OTHER): Payer: BLUE CROSS/BLUE SHIELD | Admitting: Internal Medicine

## 2016-04-08 VITALS — BP 140/80 | HR 93 | Ht 65.0 in | Wt 240.0 lb

## 2016-04-08 DIAGNOSIS — E1140 Type 2 diabetes mellitus with diabetic neuropathy, unspecified: Secondary | ICD-10-CM | POA: Diagnosis not present

## 2016-04-08 DIAGNOSIS — E039 Hypothyroidism, unspecified: Secondary | ICD-10-CM | POA: Diagnosis not present

## 2016-04-08 LAB — HEMOGLOBIN A1C: Hgb A1c MFr Bld: 5.7 % (ref 4.6–6.5)

## 2016-04-08 LAB — POCT GLYCOSYLATED HEMOGLOBIN (HGB A1C): Hemoglobin A1C: 5.6

## 2016-04-08 NOTE — Progress Notes (Signed)
Patient ID: Alexandria [name], female   DOB: [birth year], 81 y.o.   MRN: [id number]  HPI: Alexandria [name] is a 81 y.o.-year-old female, returning for follow-up for DM2, dx in 2006, non-insulin-dependent, controlled, with complications (mild CKD) and hypothyroidism. Last visit 3 mo ago.  Her breathing is better.   She has a UTI. She was seen for this yesterday.   DM2: Last hemoglobin A1c was: Lab Results  Component Value Date   HGBA1C 5.7 04/07/2016   HGBA1C 6.4 01/01/2016   HGBA1C 6.1 03/14/2015   Pt is on: - Metformin 500 mg with supper  She was on Januvia >> stopped in summer 2016, when she was admitted for sepsis.   Pt checks her sugars 1x a day and they are: - am: 109-130 >> 120, 137 >> 126-147, 157 >> 131-137 - 2h after brunch: 110-130, 189 >> 156-184 >> 120-186, 221 - 3-4h after brunch: 202 >> 112-156, 185 >> 126-195 >> 112-172 - before dinner: 103 >> n/c >> 100-151 - 2h after dinner: 130-166 >> 151, 157 >> 106-173, 188 >> 114-120 - bedtime: 135 >> 132-197, 251x1 >> 163-193, 226 - nighttime: n/c >> 169, 175 >> n/c No lows. Lowest sugar was 100 >> 112 >> 106 >> 114; she has hypoglycemia awareness at 70.  Highest sugar was 260 >> 257 >> 226.  Glucometer:One Touch Ultra mini  Pt's meals are: - Breakfast: protein drink, egg, cereals - Lunch: sandwich - Dinner: meat + 2 veggies - Snacks: 1-3 peanut butter, milk, crackers  Looking at different diets to help her lose weight.  - + mild CKD, last BUN/creatinine:  Lab Results  Component Value Date   BUN 24 (H) 01/01/2016   CREATININE 1.16 01/01/2016  On Losartan. - last set of lipids: Lab Results  Component Value Date   CHOL 165 01/01/2016   HDL 65.70 01/01/2016   LDLCALC 80 01/01/2016   LDLDIRECT 63.0 07/29/2015   TRIG 97.0 01/01/2016   CHOLHDL 3 01/01/2016  On Zetia, Crestor. - last eye exam was: 02/05/2016. No DR.  - no numbness and tingling in her feet.  She also has a h/o hypothyroidism.  She is on  Synthroid (DAW) 125 g daily: - in am (~ 5 am) - fasting - with water - eats b'fast 3-4 later - no PPI - stopped iron - no calcium - no MVI  Last TSH recently normal: Lab Results  Component Value Date   TSH 0.93 01/01/2016   She has a h/o COPD stage 2 - Dr Halford Chessman, HL, HTN, anemia, GERD.  ROS: Constitutional: no weight gain, no fatigue, no subjective hyperthermia/hypothermia, + nocturia Eyes: no blurry vision, no xerophthalmia ENT: no sore throat, no nodules palpated in throat, + dysphagia/no odynophagia, no hoarseness Cardiovascular: no CP/SOB/no palpitations/+ leg swelling Respiratory: no cough/SOB Gastrointestinal: no N/V/D/C Musculoskeletal: no muscle/joint aches Skin: no rashes, no hair loss Neurological: no tremors/numbness/tingling/dizziness, + HA  I reviewed pt's medications, allergies, PMH, social hx, family hx, and changes were documented in the history of present illness. Otherwise, unchanged from my initial visit note.  Past Medical History:  Diagnosis Date  . Allergy, unspecified not elsewhere classified   . Chronic diastolic congestive heart failure ([place])   . COPD (chronic obstructive pulmonary disease) ([place]) 03/08/1998   PFTs 12/12/2002 FEV 1 1.42 (64%) ratio 58 with no better after B2 and DLCO75%; PFTs 11/20/09 FEV1 1.50 (73%) ratio 50 no better after B2 with DLCO 62%; Hfa 50% 11/20/2009 >75%, 01/13/10 p coaching  . Depression  12/07/1998  . Diabetes mellitus type II 02/05/2006   Dr. Elyse Hsu with endo  . Disorders of bursae and tendons in shoulder region, unspecified    Rotator cuff syndrome, right  . E. coli bacteremia   . Esophagitis   . GERD (gastroesophageal reflux disease)   . History of UTI   . Hyperlipidemia 10/04/2000  . Hypertension 03/08/1992  . Hypothyroidism 03/08/1968  . Iron deficiency anemia   . Obesity    NOS  . OSA (obstructive sleep apnea)    PSG 01/27/10 AHI 13, pt does not know CPAP settings  . Peripheral neuropathy (HCC)    Likely  due to DM per Dr. Erling Cruz  . Ventral hernia    Past Surgical History:  Procedure Laterality Date  . ABD U/S  03/19/1999   Nml x2 foci in liver  . ADENOSINE MYOVIEW  06/02/2007   Nml  . CARDIOLITE PERSANTINE  08/24/2000   Nml  . CAROTID U/S  08/24/2000   1-39% ICA stenosis  . CAROTID U/S  06/02/2007   No apprec change   . CARPAL TUNNEL RELEASE  12/1997   Right  . CESAREAN SECTION     x2 Breech/ repeat  . CHOLECYSTECTOMY  1997  . COLONOSCOPY WITH PROPOFOL N/A 02/12/2016   Procedure: COLONOSCOPY WITH PROPOFOL;  Surgeon: Milus Banister, MD;  Location: [facility];  Service: Endoscopy;  Laterality: N/A;  . CT ABD W & PELVIS WO/W CM     Abd hemangiomas of liver, 1 cm R renal cyst  . DENTAL SURGERY  2016   Implants  . DEXA  07/03/2003   Nml  . ESOPHAGOGASTRODUODENOSCOPY  12/05/1997   Nml (due to hoarseness)  . ESOPHAGOGASTRODUODENOSCOPY (EGD) WITH PROPOFOL N/A 02/12/2016   Procedure: ESOPHAGOGASTRODUODENOSCOPY (EGD) WITH PROPOFOL;  Surgeon: Milus Banister, MD;  Location: [facility];  Service: Endoscopy;  Laterality: N/A;  . GALLBLADDER SURGERY    . HERNIA REPAIR  01/24/2009   Lap Ventr w/ Lysis of adhesions (Dr. Donne Hazel)  . knee arthroscopic surgery  years ago   right  . ROTATOR CUFF REPAIR  1984   Right, [place]  . SHOULDER OPEN ROTATOR CUFF REPAIR  02/08/2012   Procedure: ROTATOR CUFF REPAIR SHOULDER OPEN;  Surgeon: Magnus Sinning, MD;  Location: [facility];  Service: Orthopedics;  Laterality: Left;  Left Shoulder Open Anterior Acrominectomy Rotator Cuff Repair Open Distal Clavicle Resection ,tissue mend graft, and repair of biceps tendon  . THUMB RELEASE  12/1997   Right  . TONSILLECTOMY    . TOTAL ABDOMINAL HYSTERECTOMY  1985   Due to dysmennorhea  . US ECHOCARDIOGRAPHY  06/02/2007   Social History   Occupational History  . Retired - self employed- Engineer, structural    Social History Main Topics  . Smoking status: Former Smoker -- 2.00 packs/day for 50 years     Types: Cigarettes    Quit date: 2007  . Smokeless tobacco: Never Used  . Alcohol Use: 1.2 oz/week    2 Standard drinks or equivalent per week     Comment: occasional  . Drug Use: No  . Sexual Activity: Not on file   Social History Narrative   Married with 2 children   Enjoys painting   Current Outpatient Prescriptions on File Prior to Visit  Medication Sig Dispense Refill  . Ascorbic Acid (VITAMIN C PO) Take 1 tablet by mouth daily.    . ciprofloxacin (CIPRO) 250 MG tablet Take 1 tablet (250 mg total) by mouth 2 (two) times  daily. 6 tablet 0  . Coenzyme Q-10 200 MG CAPS Take 200 mg by mouth daily.     Marland Kitchen EPINEPHrine (EPIPEN) 0.3 mg/0.3 mL DEVI Inject 0.3 mg into the muscle once as needed (anaphylaxis).     Marland Kitchen esomeprazole (NEXIUM) 40 MG capsule Take 40 mg by mouth daily at 12 noon.    . ezetimibe (ZETIA) 10 MG tablet Take 1 tablet (10 mg total) by mouth at bedtime.    . ferrous sulfate 325 (65 FE) MG EC tablet Take 1 tablet (325 mg total) by mouth daily with breakfast.    . FLUoxetine (PROZAC) 10 MG capsule TAKE 3 CAPSULES IN THE MORNING AND TAKE 3 CAPSULES IN THE EVENING 540 capsule 3  . fluticasone (FLONASE) 50 MCG/ACT nasal spray USE 2 SPRAYS INTO THE NOSE EVERY 12 HOURS AS NEEDED FOR STUFFY NOSE 48 g 1  . furosemide (LASIX) 20 MG tablet Take 1 tablet (20 mg total) by mouth daily as needed. (Patient taking differently: Take 20 mg by mouth daily as needed for fluid. ) 90 tablet 3  . ibuprofen (ADVIL,MOTRIN) 200 MG tablet Take 200-400 mg by mouth every 6 (six) hours as needed for moderate pain.     Marland Kitchen LORazepam (ATIVAN) 0.5 MG tablet TAKE 1 TABLET BY MOUTH TWICE A DAY AS NEEDED (Patient taking differently: TAKE 1 TABLET BY MOUTH TWICE A DAY AS NEEDED FOR ANXIETY) 180 tablet 0  . metFORMIN (GLUCOPHAGE) 500 MG tablet Take 1 tablet (500 mg total) by mouth daily with supper. 90 tablet 3  . nitroGLYCERIN (NITROSTAT) 0.4 MG SL tablet Place 1 tablet (0.4 mg total) under the tongue every 5  (five) minutes as needed for chest pain. 25 tablet 3  . potassium chloride (K-DUR) 10 MEQ tablet Take 1 tablet (10 mEq total) by mouth daily as needed. (Patient taking differently: Take 10 mEq by mouth daily as needed (Takes with furosemide doses.). ) 90 tablet 3  . PROAIR HFA 108 (90 Base) MCG/ACT inhaler INHALE 2 PUFFS INTO THE LUNGS EVERY 6 HOURS AS NEEDED FOR WHEEZING OR SHORTNESS OF BREATH 1 Inhaler 2  . rosuvastatin (CRESTOR) 10 MG tablet Take 1 tablet (10 mg total) by mouth every other day. 90 tablet 1  . SYMBICORT 160-4.5 MCG/ACT inhaler INHALE 2 PUFFS FIRST THING IN THE MORNING AND 2 PUFFS AGAIN IN THE EVENING ABOUT 12 HOURS LATER 30.6 Inhaler 2  . SYNTHROID 125 MCG tablet TAKE 1 TABLET (125 MCG TOTAL) BY MOUTH DAILY BEFORE BREAKFAST. 90 tablet 1  . umeclidinium bromide (INCRUSE ELLIPTA) 62.5 MCG/INH AEPB Inhale 1 puff into the lungs daily.    . valsartan (DIOVAN) 160 MG tablet Take 1 tablet (160 mg total) by mouth 2 (two) times daily. 180 tablet 3  . vitamin B-12 (CYANOCOBALAMIN) 50 MCG tablet Take 50 mcg by mouth daily.      No current facility-administered medications on file prior to visit.    Allergies  Allergen Reactions  . Antihistamines, Diphenhydramine-Type Other (See Comments)    Throat closes up.  Does not take any antihistamines.  . Sulfonamide Derivatives Swelling    REACTION: closed throat  . Incruse Ellipta [Umeclidinium Bromide] Cough    Caused voice change and severe coughing  . Clarithromycin Other (See Comments)    REACTION: diff swallowing and mouth blisters  . Codeine Other (See Comments)    Unknown   . Spiriva Handihaler [Tiotropium Bromide Monohydrate] Other (See Comments)    Voice changes   Family History  Problem Relation Age  of Onset  . Heart disease Mother   . Thyroid disease Mother   . Emphysema Father     One lung  . Breast cancer Sister   . Cystic fibrosis Sister   . Hyperthyroidism Sister   . Osteoarthritis Brother   . Hyperthyroidism  Brother   . Esophageal cancer Neg Hx   . Cancer Neg Hx     Head or neck   PE: BP 140/80 (BP Location: Left Arm, Patient Position: Sitting)   Pulse 93   Ht '5\' 5"'$  (1.651 m)   Wt 240 lb (108.9 kg)   SpO2 94%   BMI 39.94 kg/m  Body mass index is 39.94 kg/m.  Wt Readings from Last 3 Encounters:  04/08/16 240 lb (108.9 kg)  04/07/16 237 lb 12.8 oz (107.9 kg)  03/23/16 243 lb 12.8 oz (110.6 kg)   Constitutional: overweight, in NAD Eyes: PERRLA, EOMI, no exophthalmos ENT: moist mucous membranes, no thyromegaly, no cervical lymphadenopathy Cardiovascular: RRR, +2/6 SEM, no RG Respiratory: CTA B Gastrointestinal: abdomen soft, NT, ND, BS+ Musculoskeletal: no deformities, strength intact in all 4 Skin: moist, warm, no rashes Neurological: + significant tremor with outstretched hands, DTR normal in all 4  ASSESSMENT: 1. DM2, non-insulin-dependent, controlled, with complications - mild CKD  2. Hypothyroidism  PLAN:  1. Patient with long-standing, controlled diabetes, off oral antidiabetic regimen (Januvia) after her sepsis hospitalization in 2016, with good diabetes control. At last visit, sugars were higher in am >> we added metformin low dose (500 mg) with dinner. Sugars now are very good, most at goal. Will continue Metformin low dose.  - checked HbA1c today >> 5.6% (excellent), but she in now on iron, which can artificially lower the HbA1c. - continue checking sugars at different times of the day - check once a day, rotating checks - advised for yearly eye exams >> she is UTD - given her the flu shot at last visit  - Return to clinic in 4 mo with sugar log   2. Hypothyroidism - checked TFTs 12/2015 >> normal - continue Synthroid 125 mcg daily >> feels good on this dose and she appears euthyroid.  - advised her to take the thyroid hormone every day, with water, >30 minutes before breakfast, separated by >4 hours from acid reflux medications, calcium, iron, multivitamins. She is  taking it correctly.  Philemon Kingdom, MD PhD [facility]

## 2016-04-08 NOTE — Patient Instructions (Addendum)
Please continue Metformin 500 mg daily with supper.  Continue Synthroid 125 mcg daily.  Please continue to take the thyroid hormone every day, with water, >30 minutes before breakfast, separated by >4 hours from acid reflux medications, calcium, iron, multivitamins.  Please return in 4 months with your sugar log.

## 2016-04-08 NOTE — Addendum Note (Signed)
Addended by: Caprice Beaver T on: 04/08/2016 03:30 PM   Modules accepted: Orders

## 2016-04-09 ENCOUNTER — Encounter: Payer: Self-pay | Admitting: *Deleted

## 2016-04-09 LAB — URINE CULTURE

## 2016-04-12 ENCOUNTER — Telehealth: Payer: Self-pay

## 2016-04-12 ENCOUNTER — Other Ambulatory Visit: Payer: Self-pay | Admitting: Family Medicine

## 2016-04-12 MED ORDER — CIPROFLOXACIN HCL 250 MG PO TABS: 250.0000 mg | ORAL_TABLET | Freq: Two times a day (BID) | ORAL | 0 refills | Status: DC

## 2016-04-12 NOTE — Telephone Encounter (Signed)
Pt left v/m returning call to [facility].UTI is not cleared and request med sent to [facility]. Pt request cb today.

## 2016-04-12 NOTE — Telephone Encounter (Signed)
Continue cipro. rx sent. Thanks.

## 2016-04-12 NOTE — Telephone Encounter (Signed)
Left message for [name] that Dr. [name] wants her to continue with the Cipro.  Prescription has been sent to her pharmacy.

## 2016-05-26 ENCOUNTER — Ambulatory Visit: Payer: BLUE CROSS/BLUE SHIELD | Admitting: Gastroenterology

## 2016-06-12 NOTE — Progress Notes (Signed)
Cardiology Office Note  Date:  06/15/2016   ID:  Alexandria [name], DOB 01/17/1936, MRN [id number]  PCP:  Elsie Stain, MD   Chief Complaint  Patient presents with  . other    6 mo follow up. Pt c/o when she gets upset she can feel her heart fluttering, COPD doctor is still saying she does not need oxygen, she gets SOB.  Meds verbally reviewed with patient.      HPI:  81 year old woman with  obesity,   long history of smoking for 50 years, COPD,  DM2, dx in 2006, non-insulin-dependent, controlled, with  (mild CKD) hyperlipidemia,  hypertension,   2012  chest pain, stress test at that time with no ischemia, normal EF  July 2016 with COPD exacerbation, sepsis. History of sleep apnea, uses CPAP Chronic SOB, normal echo 09/2014 She presents for follow-up of her shortness of breath symptoms  Chronic shortness of breath  Weight continues to be a problem Took lasix for a while, helped leg swelling Does not take anymore. HCT 30, started on iron 01/2016  Felt great after colonoscopy, after "clean out"  Eats out a lot High fluid intake No regular exercise program On 2 inhalers  Reports that she did rehabilitation several years ago at [facility], did not find it very useful  Not on oxygen Previously reported she wasafraid to get in the shower, has anxiety, shortness of breath Can't breath in hot steam  EKG on today's visit shows normal sinus rhythm with rate 78 bpm, nonspecific ST and T wave abnormality in lead 1 and aVL  Other past medical history reviewed Previous Escherichia coli sepsis,  At that time had anemia with hematocrit down to 25, iron deficiency anemia Treated for COPD exacerbation CT scan of the abdomen showed mild descending aorta atherosclerosis  She reports that she quit smoking 10 years ago Hemoglobin A1c 6.1 She is taking Crestor and zetia.    PMH:   has a past medical history of Allergy, unspecified not elsewhere classified; Chronic diastolic congestive heart  failure ([place]); COPD (chronic obstructive pulmonary disease) ([place]) (03/08/1998); Depression (12/07/1998); Diabetes mellitus type II (02/05/2006); Disorders of bursae and tendons in shoulder region, unspecified; E. coli bacteremia; Esophagitis; GERD (gastroesophageal reflux disease); History of UTI; Hyperlipidemia (10/04/2000); Hypertension (03/08/1992); Hypothyroidism (03/08/1968); Iron deficiency anemia; Obesity; OSA (obstructive sleep apnea); Peripheral neuropathy ([place]); and Ventral hernia.  PSH:    Past Surgical History:  Procedure Laterality Date  . ABD U/S  03/19/1999   Nml x2 foci in liver  . ADENOSINE MYOVIEW  06/02/2007   Nml  . CARDIOLITE PERSANTINE  08/24/2000   Nml  . CAROTID U/S  08/24/2000   1-39% ICA stenosis  . CAROTID U/S  06/02/2007   No apprec change   . CARPAL TUNNEL RELEASE  12/1997   Right  . CESAREAN SECTION     x2 Breech/ repeat  . CHOLECYSTECTOMY  1997  . COLONOSCOPY WITH PROPOFOL N/A 02/12/2016   Procedure: COLONOSCOPY WITH PROPOFOL;  Surgeon: Milus Banister, MD;  [facility];  Service: Endoscopy;  Laterality: N/A;  . CT ABD W & PELVIS WO/W CM     Abd hemangiomas of liver, 1 cm R renal cyst  . DENTAL SURGERY  2016   Implants  . DEXA  07/03/2003   Nml  . ESOPHAGOGASTRODUODENOSCOPY  12/05/1997   Nml (due to hoarseness)  . ESOPHAGOGASTRODUODENOSCOPY (EGD) WITH PROPOFOL N/A 02/12/2016   Procedure: ESOPHAGOGASTRODUODENOSCOPY (EGD) WITH PROPOFOL;  Surgeon: Milus Banister, MD;  Location: [facility];  Service: Endoscopy;  Laterality: N/A;  . GALLBLADDER SURGERY    . HERNIA REPAIR  01/24/2009   Lap Ventr w/ Lysis of adhesions (Dr. Donne Hazel)  . knee arthroscopic surgery  years ago   right  . ROTATOR CUFF REPAIR  1984   Right, [place]  . SHOULDER OPEN ROTATOR CUFF REPAIR  02/08/2012   Procedure: ROTATOR CUFF REPAIR SHOULDER OPEN;  Surgeon: Magnus Sinning, MD;  Location: [facility];  Service: Orthopedics;  Laterality: Left;  Left Shoulder Open  Anterior Acrominectomy Rotator Cuff Repair Open Distal Clavicle Resection ,tissue mend graft, and repair of biceps tendon  . THUMB RELEASE  12/1997   Right  . TONSILLECTOMY    . TOTAL ABDOMINAL HYSTERECTOMY  1985   Due to dysmennorhea  . US ECHOCARDIOGRAPHY  06/02/2007    Current Outpatient Prescriptions  Medication Sig Dispense Refill  . Ascorbic Acid (VITAMIN C PO) Take 1 tablet by mouth daily.    . ciprofloxacin (CIPRO) 250 MG tablet Take 1 tablet (250 mg total) by mouth 2 (two) times daily. 6 tablet 0  . Coenzyme Q-10 200 MG CAPS Take 200 mg by mouth daily.     Marland Kitchen EPINEPHrine (EPIPEN) 0.3 mg/0.3 mL DEVI Inject 0.3 mg into the muscle once as needed (anaphylaxis).     Marland Kitchen esomeprazole (NEXIUM) 40 MG capsule Take 40 mg by mouth daily at 12 noon.    . ezetimibe (ZETIA) 10 MG tablet Take 1 tablet (10 mg total) by mouth at bedtime.    . ferrous sulfate 325 (65 FE) MG EC tablet Take 1 tablet (325 mg total) by mouth daily with breakfast.    . FLUoxetine (PROZAC) 10 MG capsule TAKE 3 CAPSULES IN THE MORNING AND TAKE 3 CAPSULES IN THE EVENING 540 capsule 3  . fluticasone (FLONASE) 50 MCG/ACT nasal spray USE 2 SPRAYS INTO THE NOSE EVERY 12 HOURS AS NEEDED FOR STUFFY NOSE 48 g 1  . furosemide (LASIX) 20 MG tablet Take 1 tablet (20 mg total) by mouth daily as needed. (Patient taking differently: Take 20 mg by mouth daily as needed for fluid. ) 90 tablet 3  . ibuprofen (ADVIL,MOTRIN) 200 MG tablet Take 200-400 mg by mouth every 6 (six) hours as needed for moderate pain.     Marland Kitchen LORazepam (ATIVAN) 0.5 MG tablet TAKE 1 TABLET BY MOUTH TWICE A DAY AS NEEDED (Patient taking differently: TAKE 1 TABLET BY MOUTH TWICE A DAY AS NEEDED FOR ANXIETY) 180 tablet 0  . metFORMIN (GLUCOPHAGE) 500 MG tablet Take 1 tablet (500 mg total) by mouth daily with supper. 90 tablet 3  . nitroGLYCERIN (NITROSTAT) 0.4 MG SL tablet Place 1 tablet (0.4 mg total) under the tongue every 5 (five) minutes as needed for chest pain. 25  tablet 3  . potassium chloride (K-DUR) 10 MEQ tablet Take 1 tablet (10 mEq total) by mouth daily as needed. (Patient taking differently: Take 10 mEq by mouth daily as needed (Takes with furosemide doses.). ) 90 tablet 3  . PROAIR HFA 108 (90 Base) MCG/ACT inhaler INHALE 2 PUFFS INTO THE LUNGS EVERY 6 HOURS AS NEEDED FOR WHEEZING OR SHORTNESS OF BREATH 1 Inhaler 2  . rosuvastatin (CRESTOR) 10 MG tablet Take 1 tablet (10 mg total) by mouth every other day. 90 tablet 1  . SYMBICORT 160-4.5 MCG/ACT inhaler INHALE 2 PUFFS FIRST THING IN THE MORNING AND 2 PUFFS AGAIN IN THE EVENING ABOUT 12 HOURS LATER 30.6 Inhaler 2  . SYNTHROID 125 MCG tablet TAKE 1  TABLET (125 MCG TOTAL) BY MOUTH DAILY BEFORE BREAKFAST. 90 tablet 1  . umeclidinium bromide (INCRUSE ELLIPTA) 62.5 MCG/INH AEPB Inhale 1 puff into the lungs daily.    . valsartan (DIOVAN) 160 MG tablet Take 1 tablet (160 mg total) by mouth 2 (two) times daily. 180 tablet 3  . vitamin B-12 (CYANOCOBALAMIN) 50 MCG tablet Take 50 mcg by mouth daily.      No current facility-administered medications for this visit.      Allergies:   Antihistamines, diphenhydramine-type; Sulfonamide derivatives; Incruse ellipta [umeclidinium bromide]; Clarithromycin; Codeine; and Spiriva handihaler [tiotropium bromide monohydrate]   Social History:  The patient  reports that she quit smoking about 11 years ago. Her smoking use included Cigarettes. She has a 100.00 pack-year smoking history. She has never used smokeless tobacco. She reports that she drinks about 1.2 oz of alcohol per week . She reports that she does not use drugs.   Family History:   family history includes Breast cancer in her sister; Cystic fibrosis in her sister; Emphysema in her father; Heart disease in her mother; Hyperthyroidism in her brother and sister; Osteoarthritis in her brother; Thyroid disease in her mother.    Review of Systems: Review of Systems  Constitutional: Negative.   Respiratory:  Positive for shortness of breath.   Cardiovascular: Positive for leg swelling.  Gastrointestinal: Negative.   Musculoskeletal: Negative.        Gait instability  Neurological: Negative.   Psychiatric/Behavioral: Negative.   All other systems reviewed and are negative.    PHYSICAL EXAM: VS:  BP 118/70 (BP Location: Left Arm, Patient Position: Sitting, Cuff Size: Large)   Pulse 63   Ht '5\' 5"'$  (1.651 m)   Wt 238 lb (108 kg)   BMI 39.61 kg/m  , BMI Body mass index is 39.61 kg/m. GEN: Well nourished, well developed, in no acute distress, Morbid obese  HEENT: normal  Neck: no JVD, + carotid bruits , no  masses Cardiac: RRR; no murmurs, rubs, or gallops,Trace nonpitting lower extremity  edema Around the ankles Respiratory:  Mildly decreased breath sounds throughout, normal work of breathing GI: soft, nontender, nondistended, + BS MS: no deformity or atrophy  Skin: warm and dry, no rash Neuro:  Strength and sensation are intact Psych: euthymic mood, full affect    Recent Labs: 01/01/2016: ALT 13; BUN 24; Creatinine, Ser 1.16; Potassium 4.8; Sodium 140; TSH 0.93 01/06/2016: Hemoglobin 9.6; Platelets 284.0    Lipid Panel Lab Results  Component Value Date   CHOL 165 01/01/2016   HDL 65.70 01/01/2016   LDLCALC 80 01/01/2016   TRIG 97.0 01/01/2016      Wt Readings from Last 3 Encounters:  06/15/16 238 lb (108 kg)  04/08/16 240 lb (108.9 kg)  04/07/16 237 lb 12.8 oz (107.9 kg)       ASSESSMENT AND PLAN:   Chronic diastolic CHF (congestive heart failure) (HCC) - Plan: EKG 12-Lead Recommended she start Lasix 20 mg with potassium every other day She tried this in the past and stopped on her own High suspicion of diastolic CHF Long discussion concerning her diet, does not add salt but will eat out at restaurant on frequent basis  Essential hypertension - Plan: EKG 12-Lead Blood pressure is well controlled on today's visit. No changes made to the medications.  COPD  exacerbation ([place]) She reports no recent COPD exacerbations,  Followed by pulmonary was told she's not ready for oxygen yet On 2 inhalers  Shortness of breath Multifactorial with underlying  lung disease/COPD, morbid obesity, deconditioning Suspect component of chronic diastolic CHF Lasix every other day with potassium as above We did discuss long works/pulmonary rehabilitation at the hospital She is not interested Long discussion concerning weight loss  Type 2 diabetes mellitus with diabetic neuropathy, without long-term current use of insulin (HCC) Encouraged low carbohydrate diet for weight loss  Morbid obesity ([place]) We have encouraged continued exercise, careful diet management in an effort to lose weight.   Total encounter time more than 25 minutes  Greater than 50% was spent in counseling and coordination of care with the patient   Disposition:   F/U  6 months   No orders of the defined types were placed in this encounter.    Signed, Esmond Plants, M.D., Ph.D. 06/15/2016  [place], [place]

## 2016-06-15 ENCOUNTER — Encounter: Payer: Self-pay | Admitting: Cardiovascular Disease

## 2016-06-15 ENCOUNTER — Ambulatory Visit (INDEPENDENT_AMBULATORY_CARE_PROVIDER_SITE_OTHER): Payer: BLUE CROSS/BLUE SHIELD | Admitting: Cardiovascular Disease

## 2016-06-15 VITALS — BP 118/70 | HR 63 | Ht 65.0 in | Wt 238.0 lb

## 2016-06-15 DIAGNOSIS — G4733 Obstructive sleep apnea (adult) (pediatric): Secondary | ICD-10-CM

## 2016-06-15 DIAGNOSIS — E662 Morbid (severe) obesity with alveolar hypoventilation: Secondary | ICD-10-CM

## 2016-06-15 DIAGNOSIS — E782 Mixed hyperlipidemia: Secondary | ICD-10-CM

## 2016-06-15 DIAGNOSIS — I5032 Chronic diastolic (congestive) heart failure: Secondary | ICD-10-CM | POA: Diagnosis not present

## 2016-06-15 DIAGNOSIS — E1140 Type 2 diabetes mellitus with diabetic neuropathy, unspecified: Secondary | ICD-10-CM | POA: Diagnosis not present

## 2016-06-15 DIAGNOSIS — I10 Essential (primary) hypertension: Secondary | ICD-10-CM | POA: Diagnosis not present

## 2016-06-15 NOTE — Addendum Note (Signed)
Addended by: Dede Query R on: 06/15/2016 02:44 PM   Modules accepted: Orders

## 2016-06-15 NOTE — Patient Instructions (Addendum)
Medication Instructions:   Please consider taking lasix with potassium every other day  Labwork:  No new labs needed  Testing/Procedures:  No further testing at this time   I recommend watching educational videos on topics of interest to you at:       [email or web address]  Enter code: [email or web address]    Follow-Up: It was a pleasure seeing you in the office today. Please call us if you have new issues that need to be addressed before your next appt.  [phone]  Your physician wants you to follow-up in: 6 months.  You will receive a reminder letter in the mail two months in advance. If you don't receive a letter, please call our office to schedule the follow-up appointment.  If you need a refill on your cardiac medications before your next appointment, please call your pharmacy.

## 2016-06-16 ENCOUNTER — Other Ambulatory Visit (INDEPENDENT_AMBULATORY_CARE_PROVIDER_SITE_OTHER): Payer: BLUE CROSS/BLUE SHIELD

## 2016-06-16 ENCOUNTER — Encounter: Payer: Self-pay | Admitting: Pulmonary Disease

## 2016-06-16 ENCOUNTER — Ambulatory Visit (INDEPENDENT_AMBULATORY_CARE_PROVIDER_SITE_OTHER): Payer: BLUE CROSS/BLUE SHIELD | Admitting: Pulmonary Disease

## 2016-06-16 VITALS — BP 138/78 | HR 80 | Ht 65.0 in | Wt 239.0 lb

## 2016-06-16 DIAGNOSIS — R911 Solitary pulmonary nodule: Secondary | ICD-10-CM

## 2016-06-16 DIAGNOSIS — D509 Iron deficiency anemia, unspecified: Secondary | ICD-10-CM

## 2016-06-16 DIAGNOSIS — G4733 Obstructive sleep apnea (adult) (pediatric): Secondary | ICD-10-CM | POA: Diagnosis not present

## 2016-06-16 DIAGNOSIS — J449 Chronic obstructive pulmonary disease, unspecified: Secondary | ICD-10-CM | POA: Diagnosis not present

## 2016-06-16 DIAGNOSIS — R0609 Other forms of dyspnea: Secondary | ICD-10-CM

## 2016-06-16 DIAGNOSIS — R0600 Dyspnea, unspecified: Secondary | ICD-10-CM

## 2016-06-16 LAB — COMPREHENSIVE METABOLIC PANEL
ALT: 12 U/L (ref 0–35)
AST: 12 U/L (ref 0–37)
Albumin: 4 g/dL (ref 3.5–5.2)
Alkaline Phosphatase: 92 U/L (ref 39–117)
BUN: 25 mg/dL — ABNORMAL HIGH (ref 6–23)
CO2: 30 mEq/L (ref 19–32)
Calcium: 9.1 mg/dL (ref 8.4–10.5)
Chloride: 106 mEq/L (ref 96–112)
Creatinine, Ser: 1.02 mg/dL (ref 0.40–1.20)
GFR: 55.59 mL/min — ABNORMAL LOW (ref >60.00)
Glucose, Bld: 87 mg/dL (ref 70–99)
Potassium: 4.7 mEq/L (ref 3.5–5.1)
Sodium: 142 mEq/L (ref 135–145)
Total Bilirubin: 0.4 mg/dL (ref 0.2–1.2)
Total Protein: 7.3 g/dL (ref 6.0–8.3)

## 2016-06-16 LAB — CBC WITH DIFFERENTIAL/PLATELET
Basophils Absolute: 0.1 10*3/uL (ref 0.0–0.1)
Basophils Relative: 1.2 % (ref 0.0–3.0)
Eosinophils Absolute: 0.2 10*3/uL (ref 0.0–0.7)
Eosinophils Relative: 2.2 % (ref 0.0–5.0)
HCT: 40 % (ref 36.0–46.0)
Hemoglobin: 13.1 g/dL (ref 12.0–15.0)
Lymphocytes Relative: 23.5 % (ref 12.0–46.0)
Lymphs Abs: 2.2 10*3/uL (ref 0.7–4.0)
MCHC: 32.9 g/dL (ref 30.0–36.0)
MCV: 92 fl (ref 78.0–100.0)
Monocytes Absolute: 0.7 10*3/uL (ref 0.1–1.0)
Monocytes Relative: 7.9 % (ref 3.0–12.0)
Neutro Abs: 6.1 10*3/uL (ref 1.4–7.7)
Neutrophils Relative %: 65.2 % (ref 43.0–77.0)
Platelets: 256 10*3/uL (ref 150.0–400.0)
RBC: 4.35 Mil/uL (ref 3.87–5.11)
RDW: 14.2 % (ref 11.5–15.5)
WBC: 9.4 10*3/uL (ref 4.0–10.5)

## 2016-06-16 LAB — IBC PANEL
Iron: 31 ug/dL — ABNORMAL LOW (ref 42–145)
Saturation Ratios: 8.4 % — ABNORMAL LOW (ref 20.0–50.0)
Transferrin: 264 mg/dL (ref 212.0–360.0)

## 2016-06-16 MED ORDER — PREDNISONE 10 MG PO TABS: ORAL_TABLET | ORAL | 0 refills | Status: DC

## 2016-06-16 NOTE — Patient Instructions (Signed)
Lab test today  Will schedule CT chest  Prednisone 10 mg pill >> 3 pills daily for 2 days,  2 pills daily for 2 days 1 pill daily for 2 days  Restart incruse  Restart CPAP at night  Will arrange for overnight oxygen test with you using CPAP  Follow up in 4 weeks with Dr. Halford Chessman or Nurse Practitioner

## 2016-06-16 NOTE — Progress Notes (Signed)
Current Outpatient Prescriptions on File Prior to Visit  Medication Sig  . Coenzyme Q-10 200 MG CAPS Take 200 mg by mouth daily.   Marland Kitchen EPINEPHrine (EPIPEN) 0.3 mg/0.3 mL DEVI Inject 0.3 mg into the muscle once as needed (anaphylaxis).   Marland Kitchen esomeprazole (NEXIUM) 40 MG capsule Take 40 mg by mouth daily at 12 noon.  . ezetimibe (ZETIA) 10 MG tablet Take 1 tablet (10 mg total) by mouth at bedtime.  . ferrous sulfate 325 (65 FE) MG EC tablet Take 1 tablet (325 mg total) by mouth daily with breakfast.  . FLUoxetine (PROZAC) 10 MG capsule TAKE 3 CAPSULES IN THE MORNING AND TAKE 3 CAPSULES IN THE EVENING  . fluticasone (FLONASE) 50 MCG/ACT nasal spray USE 2 SPRAYS INTO THE NOSE EVERY 12 HOURS AS NEEDED FOR STUFFY NOSE  . furosemide (LASIX) 20 MG tablet Take 1 tablet (20 mg total) by mouth daily as needed. (Patient taking differently: Take 20 mg by mouth daily as needed for fluid. )  . ibuprofen (ADVIL,MOTRIN) 200 MG tablet Take 200-400 mg by mouth every 6 (six) hours as needed for moderate pain.   Marland Kitchen LORazepam (ATIVAN) 0.5 MG tablet TAKE 1 TABLET BY MOUTH TWICE A DAY AS NEEDED (Patient taking differently: TAKE 1 TABLET BY MOUTH TWICE A DAY AS NEEDED FOR ANXIETY)  . metFORMIN (GLUCOPHAGE) 500 MG tablet Take 1 tablet (500 mg total) by mouth daily with supper.  . nitroGLYCERIN (NITROSTAT) 0.4 MG SL tablet Place 1 tablet (0.4 mg total) under the tongue every 5 (five) minutes as needed for chest pain.  . potassium chloride (K-DUR) 10 MEQ tablet Take 1 tablet (10 mEq total) by mouth daily as needed. (Patient taking differently: Take 10 mEq by mouth daily as needed (Takes with furosemide doses.). )  . PROAIR HFA 108 (90 Base) MCG/ACT inhaler INHALE 2 PUFFS INTO THE LUNGS EVERY 6 HOURS AS NEEDED FOR WHEEZING OR SHORTNESS OF BREATH  . rosuvastatin (CRESTOR) 10 MG tablet Take 1 tablet (10 mg total) by mouth every other day.  . SYMBICORT 160-4.5 MCG/ACT inhaler INHALE 2 PUFFS FIRST THING IN THE MORNING AND 2 PUFFS AGAIN  IN THE EVENING ABOUT 12 HOURS LATER  . SYNTHROID 125 MCG tablet TAKE 1 TABLET (125 MCG TOTAL) BY MOUTH DAILY BEFORE BREAKFAST.  . valsartan (DIOVAN) 160 MG tablet Take 1 tablet (160 mg total) by mouth 2 (two) times daily.  . vitamin B-12 (CYANOCOBALAMIN) 50 MCG tablet Take 50 mcg by mouth daily.   Marland Kitchen umeclidinium bromide (INCRUSE ELLIPTA) 62.5 MCG/INH AEPB Inhale 1 puff into the lungs daily.   No current facility-administered medications on file prior to visit.     Chief Complaint  Patient presents with  . Follow-up    Pt did not start the Incruse. Pt states her breathing has worsened since last OV, pt attributes this to anxiety. Pt c/o prod cough with clear mucus. Pt denies CP/tightness and f/c/s.     Sleep tests PSG 01/27/10 >> AHI 13, SpO2 low 73% ONO with CPAP 08/09/14 >> Test time 9 hr 16 min. Mean SpO2 92.2%, low SpO2 86%. Spent 10 min with SpO2 < 88%  Pulmonary tests RAST 10/02/10 >> negative, IgE 8.1 A1AT 04/15/11 >> MM PFT 10/512 >> FEV1 1.29 (61%), FEV1% 57, TLC 6.01 (119%), RV 3.52 (153%), DLCO 67%, no BD PFT 08/15/14 >> FEV1 1.07 (57%), FEV1% 55, TLC 6.49 (128%), DLCO 47%, no BD  Cardiac tests Echo 09/28/14 >> mild LVH, EF 55 to 60%  Past medical  history Depression, Hypothyroidism, HTN, Diastolic CHF, HLD, DM, GERD, Peripheral neuropathy  Past surgical history, Family history, Social history, Allergies reviewed  Vital signs BP 138/78 (BP Location: Left Arm, Cuff Size: Normal)   Pulse 80   Ht '5\' 5"'$  (1.651 m)   Wt 239 lb (108.4 kg)   SpO2 93%   BMI 39.77 kg/m   History of Present Illness: Alexandria [name] is a 81 y.o. female with dyspnea from COPD, diastolic CHF, deconditioning, and iron deficiency anemia.  She also has hx of OSA.  She has noticed progressive dyspnea with any amount of exertion.  This also happens after she eats or bends over.  She has to rest after doing even light activity.  She can't walk full distance in grocery store.  She is getting cough and  chest congestion.  She has some wheezing also.  She can't sleep laying flat.  She hasn't been using CPAP.  She has noticed more ankle swelling and cardiology advised she try lasix.  Physical Exam:  General - pleasant Eyes - wears glasses ENT - no sinus tenderness, no oral exudate, no LAN Cardiac - regular, no murmur Chest - faint wheeze with b/l rhonchi that partially clear with cough Back - no tenderness Abd - soft, non tender Ext - ankle edema Neuro - normal strength Skin - no rashes Psych - normal mood   CMP Latest Ref Rng & Units 01/01/2016 07/29/2015 10/18/2014  Glucose 70 - 99 mg/dL 120(H) 110(H) 87  BUN 6 - 23 mg/dL 24(H) 24(H) 27(H)  Creatinine 0.40 - 1.20 mg/dL 1.16 0.95 1.06  Sodium 135 - 145 mEq/L 140 139 137  Potassium 3.5 - 5.1 mEq/L 4.8 4.5 5.1  Chloride 96 - 112 mEq/L 104 106 103  CO2 19 - 32 mEq/L '28 29 26  '$ Calcium 8.4 - 10.5 mg/dL 9.3 9.7 9.4  Total Protein 6.0 - 8.3 g/dL 6.8 - 7.3  Total Bilirubin 0.2 - 1.2 mg/dL 0.4 - 0.6  Alkaline Phos 39 - 117 U/L 91 - 90  AST 0 - 37 U/L 13 - 24  ALT 0 - 35 U/L 13 - 19    CBC Latest Ref Rng & Units 01/06/2016 01/01/2016 07/29/2015  WBC 4.0 - 10.5 K/uL 9.0 6.0 8.2  Hemoglobin 12.0 - 15.0 g/dL 9.6(L) 9.8(L) 11.3(L)  Hematocrit 36.0 - 46.0 % 30.6(L) 31.0(L) 35.0(L)  Platelets 150.0 - 400.0 K/uL 284.0 267.0 277.0     Dg Chest Port 1 View  Result Date: 09/27/2014 CLINICAL DATA:  COPD, dizziness, fever. EXAM: PORTABLE CHEST - 1 VIEW COMPARISON:  Chest x-ray dated 07/31/2014. FINDINGS: Heart size is upper normal, unchanged. Overall cardiomediastinal silhouette is stable in size and configuration. Lungs are clear. Incidental note made of a prominent epicardial fat pad at the left lung base. No evidence of pneumonia. No pleural effusion. No pneumothorax. No acute osseous abnormality. IMPRESSION: No evidence of acute cardiopulmonary abnormality. No evidence of pneumonia. Electronically Signed   By: Franki Cabot M.D.   On:  09/27/2014 13:55    Assessment/Plan:  COPD with emphysema with exacerbation. - will give her course of prednisone - don't think she needs Abx at this time - advised her to try resuming incruse >> if cough worsens then will try alternative LAMA - continue symbicort and prn albuterol  Chronic respiratory failure with hypoxia. - will start her on 2 liters oxygen with exertion  Obstructive sleep apnea. - she will resume CPAP at night - will arrange for ONO with CPAP at  night  Lung nodule. - will repeat CT chest w/o contrast  Dyspnea on exertion. - likely also has component of deconditioning - consider referral to pulmonary rehab at next visit - check labs including iron levels given history of iron deficiency anemia   Patient Instructions  Lab test today  Will schedule CT chest  Prednisone 10 mg pill >> 3 pills daily for 2 days,  2 pills daily for 2 days 1 pill daily for 2 days  Restart incruse  Restart CPAP at night  Will arrange for overnight oxygen test with you using CPAP  Follow up in 4 weeks with Dr. Halford Chessman or Nurse Practitioner   Time spent 32 minutes face to face  Chesley Mires, MD [facility]/Sleep Pager:  [phone] 06/16/2016, 3:05 PM

## 2016-06-17 ENCOUNTER — Telehealth: Payer: Self-pay | Admitting: Gastroenterology

## 2016-06-17 ENCOUNTER — Telehealth: Payer: Self-pay | Admitting: Pulmonary Disease

## 2016-06-17 DIAGNOSIS — J9611 Chronic respiratory failure with hypoxia: Secondary | ICD-10-CM

## 2016-06-17 NOTE — Telephone Encounter (Signed)
The pt has been scheduled for a follow up on 08/17/16 and added to the wait list.  The pt has been notified

## 2016-06-17 NOTE — Telephone Encounter (Signed)
New order has been placed for the pt to get her oxygen via the oxygen template.  I called and lm with Lenna Sciara to make her aware of new order.

## 2016-06-18 ENCOUNTER — Telehealth: Payer: Self-pay | Admitting: Pulmonary Disease

## 2016-06-18 NOTE — Telephone Encounter (Signed)
CMP Latest Ref Rng & Units 06/16/2016 01/01/2016 07/29/2015  Glucose 70 - 99 mg/dL 87 120(H) 110(H)  BUN 6 - 23 mg/dL 25(H) 24(H) 24(H)  Creatinine 0.40 - 1.20 mg/dL 1.02 1.16 0.95  Sodium 135 - 145 mEq/L 142 140 139  Potassium 3.5 - 5.1 mEq/L 4.7 4.8 4.5  Chloride 96 - 112 mEq/L 106 104 106  CO2 19 - 32 mEq/L '30 28 29  '$ Calcium 8.4 - 10.5 mg/dL 9.1 9.3 9.7  Total Protein 6.0 - 8.3 g/dL 7.3 6.8 -  Total Bilirubin 0.2 - 1.2 mg/dL 0.4 0.4 -  Alkaline Phos 39 - 117 U/L 92 91 -  AST 0 - 37 U/L 12 13 -  ALT 0 - 35 U/L 12 13 -    CBC Latest Ref Rng & Units 06/16/2016 01/06/2016 01/01/2016  WBC 4.0 - 10.5 K/uL 9.4 9.0 6.0  Hemoglobin 12.0 - 15.0 g/dL 13.1 9.6(L) 9.8(L)  Hematocrit 36.0 - 46.0 % 40.0 30.6(L) 31.0(L)  Platelets 150.0 - 400.0 K/uL 256.0 284.0 267.0    Iron/TIBC/Ferritin/ %Sat    Component Value Date/Time   IRON 31 (L) 06/16/2016 1630   TIBC 307 09/28/2014 1710   FERRITIN 17 09/28/2014 1710   IRONPCTSAT 8.4 (L) 06/16/2016 1630    Will have my nurse inform pt that labs are normal except low iron levels.  She should continue taking ferrous sulfate and follow up with her PCP to monitor iron levels.

## 2016-06-18 NOTE — Telephone Encounter (Signed)
LM x 1 

## 2016-06-23 ENCOUNTER — Encounter: Payer: Self-pay | Admitting: Pulmonary Disease

## 2016-06-23 NOTE — Telephone Encounter (Signed)
Pt calling back for result and can be reached @ [phone].Hillery Hunter

## 2016-06-23 NOTE — Telephone Encounter (Signed)
Pt aware of results and voiced her understanding. Nothing further needed.  

## 2016-06-23 NOTE — Telephone Encounter (Signed)
lmtcb for pt. Will route back to Ashtyn for follow up.

## 2016-06-23 NOTE — Telephone Encounter (Signed)
Pt returning call again.Alexandria [name] ° °

## 2016-06-25 ENCOUNTER — Telehealth: Payer: Self-pay | Admitting: Pulmonary Disease

## 2016-06-25 NOTE — Telephone Encounter (Signed)
This is referring to the ONO that was done.  Will await results for Dr Collie Siad review.   Called [facility], spoke with Apolonio Schneiders, states that she is actually faxing these to our office right now. Requested that these be faxed to Triage fax so that I can give these to Dr Halford Chessman as soon as they are received.  Will await fax.

## 2016-06-25 NOTE — Telephone Encounter (Signed)
Pt called and is wanting to know the results of her sleep study.  She stated that [facility] is calling her and wanting to deliver her oxygen and she stated that she would not let them until we call her with these results.  VS please advise. Thanks

## 2016-06-25 NOTE — Telephone Encounter (Signed)
Which test are your referring to?  She had overnight oximetry on CPAP schedule, but hasn't been received yet.  There weren't any sleep studies ordered otherwise.

## 2016-06-28 NOTE — Telephone Encounter (Signed)
I called and left her a detailed msg explaining why we do not have the results yet

## 2016-06-28 NOTE — Telephone Encounter (Signed)
Fax machines are still down so these have yet to be received.

## 2016-06-28 NOTE — Telephone Encounter (Signed)
Patient calling.  Advised her we are having problems with phone and fax and will have someone call her and advise about results [phone]

## 2016-06-28 NOTE — Telephone Encounter (Signed)
Alexandria [name] have you received any results on the ONO from [facility]?  thanks

## 2016-06-29 ENCOUNTER — Encounter: Payer: Self-pay | Admitting: Internal Medicine

## 2016-06-29 ENCOUNTER — Ambulatory Visit (INDEPENDENT_AMBULATORY_CARE_PROVIDER_SITE_OTHER)
Admission: RE | Admit: 2016-06-29 | Discharge: 2016-06-29 | Disposition: A | Discharge status: Home / Self Care | Payer: BLUE CROSS/BLUE SHIELD | Source: Ambulatory Visit | Attending: Pulmonary Disease | Admitting: Pulmonary Disease

## 2016-06-29 ENCOUNTER — Ambulatory Visit (INDEPENDENT_AMBULATORY_CARE_PROVIDER_SITE_OTHER): Payer: BLUE CROSS/BLUE SHIELD | Admitting: Internal Medicine

## 2016-06-29 ENCOUNTER — Ambulatory Visit: Payer: BLUE CROSS/BLUE SHIELD | Admitting: Family Medicine

## 2016-06-29 VITALS — BP 124/74 | HR 74 | Temp 97.8°F | Wt 238.0 lb

## 2016-06-29 DIAGNOSIS — R911 Solitary pulmonary nodule: Secondary | ICD-10-CM

## 2016-06-29 DIAGNOSIS — J301 Allergic rhinitis due to pollen: Secondary | ICD-10-CM

## 2016-06-29 DIAGNOSIS — R918 Other nonspecific abnormal finding of lung field: Secondary | ICD-10-CM | POA: Diagnosis not present

## 2016-06-29 MED ORDER — METHYLPREDNISOLONE ACETATE 80 MG/ML IJ SUSP
80.0000 mg | Freq: Once | INTRAMUSCULAR | Status: AC
  Administered 2016-06-29: 80 mg via INTRAMUSCULAR

## 2016-06-29 NOTE — Patient Instructions (Signed)
Allergic Rhinitis Allergic rhinitis is when the mucous membranes in the nose respond to allergens. Allergens are particles in the air that cause your body to have an allergic reaction. This causes you to release allergic antibodies. Through a chain of events, these eventually cause you to release histamine into the blood stream. Although meant to protect the body, it is this release of histamine that causes your discomfort, such as frequent sneezing, congestion, and an itchy, runny nose. What are the causes? Seasonal allergic rhinitis (hay fever) is caused by pollen allergens that may come from grasses, trees, and weeds. Year-round allergic rhinitis (perennial allergic rhinitis) is caused by allergens such as house dust mites, pet dander, and mold spores. What are the signs or symptoms?  Nasal stuffiness (congestion).  Itchy, runny nose with sneezing and tearing of the eyes. How is this diagnosed? Your health care provider can help you determine the allergen or allergens that trigger your symptoms. If you and your health care provider are unable to determine the allergen, skin or blood testing may be used. Your health care provider will diagnose your condition after taking your health history and performing a physical exam. Your health care provider may assess you for other related conditions, such as asthma, pink eye, or an ear infection. How is this treated? Allergic rhinitis does not have a cure, but it can be controlled by:  Medicines that block allergy symptoms. These may include allergy shots, nasal sprays, and oral antihistamines.  Avoiding the allergen. Hay fever may often be treated with antihistamines in pill or nasal spray forms. Antihistamines block the effects of histamine. There are over-the-counter medicines that may help with nasal congestion and swelling around the eyes. Check with your health care provider before taking or giving this medicine. If avoiding the allergen or the  medicine prescribed do not work, there are many new medicines your health care provider can prescribe. Stronger medicine may be used if initial measures are ineffective. Desensitizing injections can be used if medicine and avoidance does not work. Desensitization is when a patient is given ongoing shots until the body becomes less sensitive to the allergen. Make sure you follow up with your health care provider if problems continue. Follow these instructions at home: It is not possible to completely avoid allergens, but you can reduce your symptoms by taking steps to limit your exposure to them. It helps to know exactly what you are allergic to so that you can avoid your specific triggers. Contact a health care provider if:  You have a fever.  You develop a cough that does not stop easily (persistent).  You have shortness of breath.  You start wheezing.  Symptoms interfere with normal daily activities. This information is not intended to replace advice given to you by your health care provider. Make sure you discuss any questions you have with your health care provider. Document Released: 11/17/2000 Document Revised: 10/24/2015 Document Reviewed: 10/30/2012 Elsevier Interactive Patient Education  2017 Elsevier Inc.  

## 2016-06-29 NOTE — Addendum Note (Signed)
Addended by: Lurlean Nanny on: 06/29/2016 11:48 AM   Modules accepted: Orders

## 2016-06-29 NOTE — Progress Notes (Signed)
HPI  Pt presents to the clinic today with c/o headache, watery eyes, ear fullness and runny nose. This started 5 days ago. She is blowing clear mucous out of her nose. She denies ear pain or decreased hearing. She denies fever, chills, body aches, sore throat or cough. She has taken Benadryl, nasal Saline, Allegra and Ibuprofen with minimal relief. She has a history of COPD and CHF. She has not had sick contacts that she is aware of.   Review of Systems     Past Medical History:  Diagnosis Date  . Allergy, unspecified not elsewhere classified   . Chronic diastolic congestive heart failure ([place])   . COPD (chronic obstructive pulmonary disease) ([place]) 03/08/1998   PFTs 12/12/2002 FEV 1 1.42 (64%) ratio 58 with no better after B2 and DLCO75%; PFTs 11/20/09 FEV1 1.50 (73%) ratio 50 no better after B2 with DLCO 62%; Hfa 50% 11/20/2009 >75%, 01/13/10 p coaching  . Depression 12/07/1998  . Diabetes mellitus type II 02/05/2006   Dr. Elyse Hsu with endo  . Disorders of bursae and tendons in shoulder region, unspecified    Rotator cuff syndrome, right  . E. coli bacteremia   . Esophagitis   . GERD (gastroesophageal reflux disease)   . History of UTI   . Hyperlipidemia 10/04/2000  . Hypertension 03/08/1992  . Hypothyroidism 03/08/1968  . Iron deficiency anemia   . Obesity    NOS  . OSA (obstructive sleep apnea)    PSG 01/27/10 AHI 13, pt does not know CPAP settings  . Peripheral neuropathy    Likely due to DM per Dr. Erling Cruz  . Ventral hernia     Family History  Problem Relation Age of Onset  . Heart disease Mother   . Thyroid disease Mother   . Emphysema Father     One lung  . Breast cancer Sister   . Cystic fibrosis Sister   . Hyperthyroidism Sister   . Osteoarthritis Brother   . Hyperthyroidism Brother   . Esophageal cancer Neg Hx   . Cancer Neg Hx     Head or neck    Social History   Social History  . Marital status: Married    Spouse name: N/A  . Number of children: 2  .  Years of education: N/A   Occupational History  . Retired Retired   Social History Main Topics  . Smoking status: Former Smoker    Packs/day: 2.00    Years: 50.00    Types: Cigarettes    Quit date: 02/05/2005  . Smokeless tobacco: Never Used  . Alcohol use 1.2 oz/week    2 Standard drinks or equivalent per week     Comment: occasional  . Drug use: No  . Sexual activity: Not on file   Other Topics Concern  . Not on file   Social History Narrative   Married with 2 children   Enjoys painting    Allergies  Allergen Reactions  . Antihistamines, Diphenhydramine-Type Other (See Comments)    Throat closes up.  Does not take any antihistamines.  . Sulfonamide Derivatives Swelling    REACTION: closed throat  . Incruse Ellipta [Umeclidinium Bromide] Cough    Caused voice change and severe coughing  . Clarithromycin Other (See Comments)    REACTION: diff swallowing and mouth blisters  . Codeine Other (See Comments)    Unknown   . Spiriva Handihaler [Tiotropium Bromide Monohydrate] Other (See Comments)    Voice changes     Constitutional: Positive headache. Denies  fatigue, fever or abrupt weight changes.  HEENT:  Positive runny nose, ear fullness. Denies eye redness, ear pain, ringing in the ears, wax buildup, runny nose or bloody nose. Respiratory: Denies cough, difficulty breathing or shortness of breath.  Cardiovascular: Denies chest pain, chest tightness, palpitations or swelling in the hands or feet.   No other specific complaints in a complete review of systems (except as listed in HPI above).  Objective:   BP 124/74   Pulse 74   Temp 97.8 F (36.6 C) (Oral)   Wt 238 lb (108 kg)   BMI 39.61 kg/m   General: Appears her stated age, in NAD. HEENT: Head: normal shape and size, no sinus tenderness noted; Eyes: sclera white, no icterus, conjunctiva pink; Ears: Tm's gray and intact, normal light reflex; Nose: mucosa boggy and moist, septum midline; Throat/Mouth: + PND.  Teeth present, mucosa pink and moist, no exudate noted, no lesions or ulcerations noted.  Neck:  No adenopathy noted.  Pulmonary/Chest: Normal effort and positive vesicular breath sounds. No respiratory distress. No wheezes, rales or ronchi noted.       Assessment & Plan:   Allergic Rhinitis  Can use a Neti Pot which can be purchased from your local drug store. Flonase 2 sprays each nostril for 3 days and then as needed. Continue Allegra 80 mg Depo IM today  RTC as needed or if symptoms persist. Webb Silversmith, NP

## 2016-07-01 ENCOUNTER — Telehealth: Payer: Self-pay | Admitting: Pulmonary Disease

## 2016-07-01 NOTE — Telephone Encounter (Signed)
ONO has been placed in VS's cubby for review. Will route to Asht[place] to f/u on.

## 2016-07-01 NOTE — Telephone Encounter (Signed)
Alexandria [name] brought the patient's ONO report from [facility] for Dr. Halford Chessman.  This has been placed in Dr. Juanetta Gosling folder up front.  States can call the patient at [phone].

## 2016-07-01 NOTE — Telephone Encounter (Signed)
CT chest 06/29/16 >> atherosclerosis, RUL nodule 7 mm (was 8 mm), no change 6 mm LUL nodule, no change 4 mm RLL nodule   Please inform her that lung nodules are stable, and no new findings.  Things look good, and she will need another scan in April of 2019 to continue monitoring.

## 2016-07-01 NOTE — Telephone Encounter (Signed)
VS  Please Advise-  Pt called in wanting results of her CT scan

## 2016-07-01 NOTE — Telephone Encounter (Signed)
Spoke with pt, aware of results/recs.  Nothing further needed.  

## 2016-07-02 ENCOUNTER — Telehealth: Payer: Self-pay | Admitting: Pulmonary Disease

## 2016-07-02 DIAGNOSIS — G4733 Obstructive sleep apnea (adult) (pediatric): Secondary | ICD-10-CM

## 2016-07-02 NOTE — Telephone Encounter (Signed)
Spoke with pt and informed her of her results per VS. Pt understood and agreed to the sleep study. The order was placed. She also stated [facility] has been trying to reach her but she did not answer their call because she thought they were calling to only set her up for oxygen at night and not for her POC. I informed her she should return their call because no order for oxygen for at night has been placed yet as to where additional testing was needed. She stated she will contact them back. Nothing further is needed

## 2016-07-02 NOTE — Telephone Encounter (Signed)
ONO with CPAP 06/24/16 >> test time 7 hrs 29 min.  Average SpO2 90%, low SpO2 79%.  Spent 58 min with SpO2 < 88%.   Will have my nurse inform pt that ONO shows low oxygen level at night even though she is using CPAP.  She needs to have an in lab CPAP titration study to determine whether she needs adjustment in CPAP settings or if she needs to use supplemental oxygen at night with CPAP.    If she is agreeable to this plan, then please place order for CPAP titration study in sleep lab.

## 2016-07-02 NOTE — Telephone Encounter (Signed)
  Alexandria Mires, MD 56 minutes ago (9:46 AM)      ONO with CPAP 06/24/16 >> test time 7 hrs 29 min.  Average SpO2 90%, low SpO2 79%.  Spent 58 min with SpO2 < 88%.   Will have my nurse inform pt that ONO shows low oxygen level at night even though she is using CPAP.  She needs to have an in lab CPAP titration study to determine whether she needs adjustment in CPAP settings or if she needs to use supplemental oxygen at night with CPAP.    If she is agreeable to this plan, then please place order for CPAP titration study in sleep lab.

## 2016-07-04 ENCOUNTER — Other Ambulatory Visit: Payer: Self-pay | Admitting: Family Medicine

## 2016-07-04 ENCOUNTER — Other Ambulatory Visit: Payer: Self-pay | Admitting: Internal Medicine

## 2016-07-05 NOTE — Telephone Encounter (Signed)
lmtcb for pt.  

## 2016-07-06 ENCOUNTER — Telehealth: Payer: Self-pay | Admitting: Pulmonary Disease

## 2016-07-06 DIAGNOSIS — J449 Chronic obstructive pulmonary disease, unspecified: Secondary | ICD-10-CM

## 2016-07-06 NOTE — Telephone Encounter (Signed)
See phone note 07/02/16. Will close this encounter

## 2016-07-06 NOTE — Telephone Encounter (Signed)
Called and spoke with pt and she is stating that the oxygen is causing her nose to run and burn and giving her a headache.  VS can we get her set up with a humidifier for her oxygen as well?  Thanks  Pt uses [facility] for her DME.

## 2016-07-07 ENCOUNTER — Telehealth: Payer: Self-pay | Admitting: Pulmonary Disease

## 2016-07-07 NOTE — Telephone Encounter (Signed)
Spoke with pt who stated she was going out of town and needed a POC. According to her previous orders this was stated to arrange for this to happen. Called [facility] and spoke with Amy who looked into it and stated she will have them call the pt to arrange this, also informed her that pt is leaving to go out of town on Friday and would like this done before than. She states she will call pt today about this. Pt is aware to be on the look out for their call. Nothing further is needed

## 2016-07-07 NOTE — Telephone Encounter (Signed)
Please send order for humidifier for home oxygen set up.

## 2016-07-07 NOTE — Telephone Encounter (Signed)
Spoke with pt and informed her of VS message. Pt agreed to the order being placed. The order was placed and she had no further questions. Nothing further is needed

## 2016-07-09 ENCOUNTER — Other Ambulatory Visit: Payer: Self-pay | Admitting: Family Medicine

## 2016-07-09 MED ORDER — FLUOXETINE HCL 10 MG PO CAPS: ORAL_CAPSULE | ORAL | 3 refills | Status: DC

## 2016-07-09 NOTE — Telephone Encounter (Signed)
Electronic refill request. Last office visit:   04/07/16 Last Filled:    180 tablet 0 01/17/2016  Please advise.

## 2016-07-11 NOTE — Telephone Encounter (Signed)
Please call in.  Thanks.   

## 2016-07-12 ENCOUNTER — Other Ambulatory Visit: Payer: Self-pay | Admitting: *Deleted

## 2016-07-12 NOTE — Telephone Encounter (Signed)
Medication phoned to pharmacy.  

## 2016-07-13 ENCOUNTER — Other Ambulatory Visit: Payer: Self-pay | Admitting: *Deleted

## 2016-07-14 ENCOUNTER — Ambulatory Visit (INDEPENDENT_AMBULATORY_CARE_PROVIDER_SITE_OTHER): Payer: BLUE CROSS/BLUE SHIELD | Admitting: Adult Health

## 2016-07-14 ENCOUNTER — Encounter: Payer: Self-pay | Admitting: Adult Health

## 2016-07-14 DIAGNOSIS — J449 Chronic obstructive pulmonary disease, unspecified: Secondary | ICD-10-CM | POA: Diagnosis not present

## 2016-07-14 DIAGNOSIS — J9611 Chronic respiratory failure with hypoxia: Secondary | ICD-10-CM

## 2016-07-14 DIAGNOSIS — G4733 Obstructive sleep apnea (adult) (pediatric): Secondary | ICD-10-CM | POA: Diagnosis not present

## 2016-07-14 NOTE — Progress Notes (Signed)
I have reviewed and agree with assessment/plan.  Chesley Mires, MD [place] 07/14/2016, 11:14 PM Pager:  [phone]

## 2016-07-14 NOTE — Patient Instructions (Addendum)
Saline nasal spray As needed   May start INCRUSE daily , rinse after use.  Continue on Oxygen 2l/m (continuous) with walking. (or 4 l/m pulse setting )  Continue on CPAP At bedtime   follow up Dr. Halford Chessman  In 3 months and As needed   Please contact office for sooner follow up if symptoms do not improve or worsen or seek emergency care

## 2016-07-14 NOTE — Assessment & Plan Note (Signed)
Patient is to continue on oxygen at 2 L on continuous flow or 4 L pulsing.

## 2016-07-14 NOTE — Progress Notes (Signed)
$'@Patient'G$  ID: Alexandria [name], female    DOB: 1936/03/06, 81 y.o.   MRN: [id number]  Chief Complaint  Patient presents with  . Follow-up    COPD     Referring provider: Tonia Ghent, MD  HPI: 81 year old female former smoker followed for COPD, diastolic congestive heart failure, obstructive sleep apnea  TEST  PSG 01/27/10 >> AHI 13, SpO2 low 73% ONO with CPAP 08/09/14 >> Test time 9 hr 16 min. Mean SpO2 92.2%, low SpO2 86%. Spent 10 min with SpO2 <88%   Pulmonary tests RAST 10/02/10 >> negative, IgE 8.1 A1AT 04/15/11 >> MM PFT 10/512 >> FEV1 1.29 (61%), FEV1% 57, TLC 6.01 (119%), RV 3.52 (153%), DLCO 67%, no BD PFT 08/15/14 >> FEV1 1.07 (57%), FEV1% 55, TLC 6.49 (128%), DLCO 47%, no BD  Cardiac tests Echo 09/28/14 >> mild LVH, EF 55 to 60%  07/14/2016 Follow up : COPD , O2 RF , Lung nodules  Pt Returns for a one-month follow-up. Patient was seen last visit with a COPD flare. She was given a prednisone taper. Restarted on INCRUSE. Patient was also noted to have decreased oxygen with walking. She was started on 2 L of oxygen. Patient says that she feels the oxygen has really helped her. She did not start the inhaler. But feels that she may start that in the next week or 2.  Patient has known pulmonary nodules. CT chest was set up 06/29/2016 that showed stable nodules..  Patient remains on C Pap at bedtime. She says she is doing well. Overnight oximetry test was done on her C Pap. It did show desaturations. Patient has been set up for a C Pap titration study. This has been scheduled for next month.     Allergies  Allergen Reactions  . Antihistamines, Diphenhydramine-Type Other (See Comments)    Throat closes up.  Does not take any antihistamines.  . Sulfonamide Derivatives Swelling    REACTION: closed throat  . Incruse Ellipta [Umeclidinium Bromide] Cough    Caused voice change and severe coughing  . Clarithromycin Other (See Comments)    REACTION: diff swallowing  and mouth blisters  . Codeine Other (See Comments)    Unknown   . Spiriva Handihaler [Tiotropium Bromide Monohydrate] Other (See Comments)    Voice changes    Immunization History  Administered Date(s) Administered  . DT 12/23/2010  . H1N1 05/30/2008  . Influenza Split 12/23/2010, 12/16/2011, 01/08/2013  . Influenza Whole 12/07/1995, 12/07/2005, 01/04/2008, 01/06/2010  . Influenza, High Dose Seasonal PF 01/06/2016  . Influenza,inj,Quad PF,36+ Mos 12/26/2013, 12/05/2014  . PPD Test 10/02/2014  . Pneumococcal Conjugate-13 08/17/2013  . Pneumococcal Polysaccharide-23 12/06/2000, 03/08/2005, 10/24/2008  . Td 03/08/1993, 10/04/2000, 12/23/2010  . Zoster 12/07/2005    Past Medical History:  Diagnosis Date  . Allergy, unspecified not elsewhere classified   . Chronic diastolic congestive heart failure ([place])   . COPD (chronic obstructive pulmonary disease) ([place]) 03/08/1998   PFTs 12/12/2002 FEV 1 1.42 (64%) ratio 58 with no better after B2 and DLCO75%; PFTs 11/20/09 FEV1 1.50 (73%) ratio 50 no better after B2 with DLCO 62%; Hfa 50% 11/20/2009 >75%, 01/13/10 p coaching  . Depression 12/07/1998  . Diabetes mellitus type II 02/05/2006   Dr. Elyse Hsu with endo  . Disorders of bursae and tendons in shoulder region, unspecified    Rotator cuff syndrome, right  . E. coli bacteremia   . Esophagitis   . GERD (gastroesophageal reflux disease)   . History of UTI   .  Hyperlipidemia 10/04/2000  . Hypertension 03/08/1992  . Hypothyroidism 03/08/1968  . Iron deficiency anemia   . Obesity    NOS  . OSA (obstructive sleep apnea)    PSG 01/27/10 AHI 13, pt does not know CPAP settings  . Peripheral neuropathy    Likely due to DM per Dr. Erling Cruz  . Ventral hernia     Tobacco History: History  Smoking Status  . Former Smoker  . Packs/day: 2.00  . Years: 50.00  . Types: Cigarettes  . Quit date: 02/05/2005  Smokeless Tobacco  . Never Used   Counseling given: Not Answered   Outpatient  Encounter Prescriptions as of 07/14/2016  Medication Sig  . Coenzyme Q-10 200 MG CAPS Take 200 mg by mouth daily.   Marland Kitchen EPINEPHrine (EPIPEN) 0.3 mg/0.3 mL DEVI Inject 0.3 mg into the muscle once as needed (anaphylaxis).   Marland Kitchen esomeprazole (NEXIUM) 40 MG capsule Take 40 mg by mouth daily at 12 noon.  . ezetimibe (ZETIA) 10 MG tablet Take 1 tablet (10 mg total) by mouth at bedtime.  . ferrous sulfate 325 (65 FE) MG EC tablet Take 1 tablet (325 mg total) by mouth daily with breakfast.  . FLUoxetine (PROZAC) 10 MG capsule TAKE 3 CAPSULES IN THE MORNING AND TAKE 3 CAPSULES IN THE EVENING  . fluticasone (FLONASE) 50 MCG/ACT nasal spray USE 2 SPRAYS INTO THE NOSE EVERY 12 HOURS AS NEEDED FOR STUFFY NOSE  . furosemide (LASIX) 20 MG tablet Take 1 tablet (20 mg total) by mouth daily as needed. (Patient taking differently: Take 20 mg by mouth daily as needed for fluid. )  . ibuprofen (ADVIL,MOTRIN) 200 MG tablet Take 200-400 mg by mouth every 6 (six) hours as needed for moderate pain.   Marland Kitchen LORazepam (ATIVAN) 0.5 MG tablet TAKE 1 TABLET BY MOUTH TWICE DAILY AS NEEDED. DNF 01/26/16  . metFORMIN (GLUCOPHAGE) 500 MG tablet Take 1 tablet (500 mg total) by mouth daily with supper.  . nitroGLYCERIN (NITROSTAT) 0.4 MG SL tablet Place 1 tablet (0.4 mg total) under the tongue every 5 (five) minutes as needed for chest pain.  . potassium chloride (K-DUR) 10 MEQ tablet Take 1 tablet (10 mEq total) by mouth daily as needed. (Patient taking differently: Take 10 mEq by mouth daily as needed (Takes with furosemide doses.). )  . PROAIR HFA 108 (90 Base) MCG/ACT inhaler INHALE 2 PUFFS INTO THE LUNGS EVERY 6 HOURS AS NEEDED FOR WHEEZING OR SHORTNESS OF BREATH  . rosuvastatin (CRESTOR) 10 MG tablet Take 1 tablet (10 mg total) by mouth every other day.  . SYMBICORT 160-4.5 MCG/ACT inhaler INHALE 2 PUFFS FIRST THING IN THE MORNING AND 2 PUFFS AGAIN IN THE EVENING ABOUT 12 HOURS LATER  . SYNTHROID 125 MCG tablet TAKE 1 TABLET (125 MCG  TOTAL) BY MOUTH DAILY BEFORE BREAKFAST.  Marland Kitchen umeclidinium bromide (INCRUSE ELLIPTA) 62.5 MCG/INH AEPB Inhale 1 puff into the lungs daily.  . valsartan (DIOVAN) 160 MG tablet Take 1 tablet (160 mg total) by mouth 2 (two) times daily.  . vitamin B-12 (CYANOCOBALAMIN) 50 MCG tablet Take 50 mcg by mouth daily.    No facility-administered encounter medications on file as of 07/14/2016.      Review of Systems  Constitutional:   No  weight loss, night sweats,  Fevers, chills, +fatigue, or  lassitude.  HEENT:   No headaches,  Difficulty swallowing,  Tooth/dental problems, or  Sore throat,  No sneezing, itching, ear ache, nasal congestion, post nasal drip,   CV:  No chest pain,  Orthopnea, PND,  , anasarca, dizziness, palpitations, syncope.   GI  No heartburn, indigestion, abdominal pain, nausea, vomiting, diarrhea, change in bowel habits, loss of appetite, bloody stools.   Resp:    No chest wall deformity  Skin: no rash or lesions.  GU: no dysuria, change in color of urine, no urgency or frequency.  No flank pain, no hematuria   MS:  No joint pain or swelling.  No decreased range of motion.  No back pain.    Physical Exam  BP 124/66 (BP Location: Right Arm, Cuff Size: Normal)   Pulse 77   Ht '5\' 5"'$  (1.651 m)   Wt 240 lb 12.8 oz (109.2 kg)   SpO2 94%   BMI 40.07 kg/m   GEN: A/Ox3; pleasant , NAD, elderly  On O2    HEENT:  [place]/AT,  EACs-clear, TMs-wnl, NOSE-clear, THROAT-clear, no lesions, no postnasal drip or exudate noted.   NECK:  Supple w/ fair ROM; no JVD; normal carotid impulses w/o bruits; no thyromegaly or nodules palpated; no lymphadenopathy.    RESP  Clear  P & A; w/o, wheezes/ rales/ or rhonchi. no accessory muscle use, no dullness to percussion  CARD:  RRR, no m/r/g, tr -1  peripheral edema, pulses intact, no cyanosis or clubbing.  GI:   Soft & nt; nml bowel sounds; no organomegaly or masses detected.   Musco: Warm bil, no deformities or joint swelling  noted.   Neuro: alert, no focal deficits noted.    Skin: Warm, no lesions or rashes    Lab Results:  CBC    Component Value Date/Time   WBC 9.4 06/16/2016 1630   RBC 4.35 06/16/2016 1630   HGB 13.1 06/16/2016 1630   HCT 40.0 06/16/2016 1630   PLT 256.0 06/16/2016 1630   MCV 92.0 06/16/2016 1630   MCH 23.1 (L) 10/01/2014 0326   MCHC 32.9 06/16/2016 1630   RDW 14.2 06/16/2016 1630   LYMPHSABS 2.2 06/16/2016 1630   MONOABS 0.7 06/16/2016 1630   EOSABS 0.2 06/16/2016 1630   BASOSABS 0.1 06/16/2016 1630    BMET    Component Value Date/Time   NA 142 06/16/2016 1630   K 4.7 06/16/2016 1630   CL 106 06/16/2016 1630   CO2 30 06/16/2016 1630   GLUCOSE 87 06/16/2016 1630   BUN 25 (H) 06/16/2016 1630   CREATININE 1.02 06/16/2016 1630   CALCIUM 9.1 06/16/2016 1630   GFRNONAA 42 (L) 10/02/2014 0544   GFRAA 48 (L) 10/02/2014 0544    BNP No results found for: BNP  ProBNP    Component Value Date/Time   PROBNP 34.0 08/24/2012 1621    Imaging: Ct Chest Wo Contrast  Result Date: 06/29/2016 CLINICAL DATA:  Followup routine lung nodule EXAM: CT CHEST WITHOUT CONTRAST TECHNIQUE: Multidetector CT imaging of the chest was performed following the standard protocol without IV contrast. COMPARISON:  01/19/2016 FINDINGS: Cardiovascular: Coronary artery calcification and aortic atherosclerotic calcification. Mediastinum/Nodes: No axillary or supraclavicular adenopathy. No mediastinal or hilar adenopathy. No pericardial fluid. Lungs/Pleura: THe subpleural nodule in the RIGHT upper lobe measures 7 mm compared to 8 mm and is less conspicuous. No interval growth. Additional scattered pulmonary nodules are stable. For example 6 mm nodule in the central LEFT upper lobe (image 83, series 3) not changed. On the same slice 4 mm nodule in the RIGHT lower lobe is unchanged. No new pulmonary nodules. Upper  Abdomen: Limited view of the liver, kidneys, pancreas are unremarkable. Normal adrenal glands.  Musculoskeletal: No aggressive osseous lesion.  Osseous IMPRESSION: 1. Stable bilateral pulmonary nodules. The largest nodules in the central LEFT upper lobe measuring 6 mm (image 83, series 3). It smoking history, recommend follow-up CT 12 months per 2. Coronary artery calcification and aortic atherosclerotic calcification. Electronically Signed   By: Suzy Bouchard M.D.   On: 06/29/2016 17:01     Assessment & Plan:   Obstructive sleep apnea Continue on C Pap at bedtime. Overnight oximetry test did show desaturations despite CPAP. Patient is been set up for a C Pap titration study.   COPD GOLD II Recent flare, now resolving   Have recommended patient began INCRUSE.    Chronic respiratory failure with hypoxia ([place]) Patient is to continue on oxygen at 2 L on continuous flow or 4 L pulsing.     Rexene Edison, NP 07/14/2016

## 2016-07-14 NOTE — Assessment & Plan Note (Signed)
Recent flare, now resolving   Have recommended patient began INCRUSE.

## 2016-07-14 NOTE — Assessment & Plan Note (Signed)
Continue on C Pap at bedtime. Overnight oximetry test did show desaturations despite CPAP. Patient is been set up for a C Pap titration study.

## 2016-07-15 ENCOUNTER — Ambulatory Visit: Payer: BLUE CROSS/BLUE SHIELD | Admitting: Adult Health

## 2016-08-03 ENCOUNTER — Ambulatory Visit: Payer: BLUE CROSS/BLUE SHIELD | Admitting: Gastroenterology

## 2016-08-08 ENCOUNTER — Ambulatory Visit (HOSPITAL_BASED_OUTPATIENT_CLINIC_OR_DEPARTMENT_OTHER): Payer: BLUE CROSS/BLUE SHIELD | Attending: Pulmonary Disease | Admitting: Pulmonary Disease

## 2016-08-08 VITALS — Ht 65.0 in | Wt 239.0 lb

## 2016-08-08 DIAGNOSIS — G4733 Obstructive sleep apnea (adult) (pediatric): Secondary | ICD-10-CM | POA: Diagnosis not present

## 2016-08-08 DIAGNOSIS — J432 Centrilobular emphysema: Secondary | ICD-10-CM

## 2016-08-08 DIAGNOSIS — J9611 Chronic respiratory failure with hypoxia: Secondary | ICD-10-CM

## 2016-08-16 ENCOUNTER — Telehealth: Payer: Self-pay | Admitting: Pulmonary Disease

## 2016-08-16 DIAGNOSIS — G4733 Obstructive sleep apnea (adult) (pediatric): Secondary | ICD-10-CM

## 2016-08-16 NOTE — Telephone Encounter (Signed)
Pt was also informed to contact office once oxygen change is made to schedule ROV.

## 2016-08-16 NOTE — Procedures (Signed)
   Patient Name: Alexandria [name], Alexandria [name] Date: 08/08/2016 Gender: Female D.O.B: 12/02/1935 Age (years): 18 Referring Provider: Chesley Mires MD, ABSM Height (inches): 65 Interpreting Physician: Chesley Mires MD, ABSM Weight (lbs): 239 RPSGT: Zadie Rhine BMI: 40 MRN: [id number] Neck Size: 17.00  CLINICAL INFORMATION The patient obstructive sleep apnea on CPAP, COPD with emphysema and chronic respiratory failure. She had overnight oximetry on CPAP showing low oxygen. She is referred to the sleep lab to determine optimal CPAP setting, and whether she needs supplemental oxygen at night with CPAP.  SLEEP STUDY TECHNIQUE As per the AASM Manual for the Scoring of Sleep and Associated Events v2.3 (April 2016) with a hypopnea requiring 4% desaturations.  The channels recorded and monitored were frontal, central and occipital EEG, electrooculogram (EOG), submentalis EMG (chin), nasal and oral airflow, thoracic and abdominal wall motion, anterior tibialis EMG, snore microphone, electrocardiogram, and pulse oximetry. Continuous positive airway pressure (CPAP) was initiated at the beginning of the study and titrated to treat sleep-disordered breathing.  MEDICATIONS Medications self-administered by patient taken the night of the study : N/A  TECHNICIAN COMMENTS Comments added by technician: PT [place]. O2 initiated due to low sats.  Comments added by scorer: N/A  RESPIRATORY PARAMETERS Optimal PAP Pressure (cm): 8 AHI at Optimal Pressure (/hr): 1.8 Overall Minimal O2 (%): 74.00 Supine % at Optimal Pressure (%): 100 Minimal O2 at Optimal Pressure (%): 83.0      She had good control of her sleep apnea with CPAP.  She continued to have oxygen desaturation lasting for more than 5 minutes.  She was started on 2 liters supplemental oxygen with CPAP with good control of her oxygenation.   SLEEP ARCHITECTURE The study was initiated at 10:59:07 PM and ended at 5:15:04 AM.  Sleep onset  time was 7.5 minutes and the sleep efficiency was 87.1%. The total sleep time was 327.5 minutes.  The patient spent 2.90% of the night in stage N1 sleep, 61.07% in stage N2 sleep, 0.76% in stage N3 and 35.26% in REM.Stage REM latency was 120.0 minutes  Wake after sleep onset was 41.0. Alpha intrusion was absent. Supine sleep was 100.00%.  CARDIAC DATA The 2 lead EKG demonstrated sinus rhythm. The mean heart rate was 70.06 beats per minute. Other EKG findings include: None.  LEG MOVEMENT DATA The total Periodic Limb Movements of Sleep (PLMS) were 657. The PLMS index was 120.37. A PLMS index of <15 is considered normal in adults.  IMPRESSIONS - The optimal PAP pressure was 8 cm of water with 2 liters supplemental oxygen. - She had an increase in her periodic limb movement index.  DIAGNOSIS - Obstructive Sleep Apnea (G47.33) - Chronic respiratory failure with hypoxia (J96.11) - COPD with emphysema (J43.9)  RECOMMENDATIONS - Trial of CPAP therapy on 8 cm H2O with 2 liters supplemental oxygen. - She was fitted with a Small size Resmed Full Face Mask AirFit F20 mask and heated humidification.  [Electronically signed] 08/16/2016 10:09 AM  Chesley Mires MD, ABSM Diplomate, American Board of Sleep Medicine   NPI: [id number]

## 2016-08-16 NOTE — Telephone Encounter (Signed)
Spoke with patient and informed her of results and recommendations. She verbalized understanding and did not have any questions. Order placed for oxygen. Nothing further is needed.

## 2016-08-16 NOTE — Telephone Encounter (Signed)
CPAP titration 08/08/16 >> CPAP 8 cm H2O with 2 liters oxygen, PLMI 120.37.   Will have my nurse inform pt that CPAP titration showed good control of CPAP with 8 cm H2O, but her oxygen level was low.  This improved after adding 2 liters oxygen with CPAP.  Please arrange for her to get 2 liters oxygen added to CPAP 8 cm H2O at night.  She needs ROV in 2 months after this change >> can be with me or NP.

## 2016-08-17 ENCOUNTER — Other Ambulatory Visit (INDEPENDENT_AMBULATORY_CARE_PROVIDER_SITE_OTHER): Payer: BLUE CROSS/BLUE SHIELD

## 2016-08-17 ENCOUNTER — Ambulatory Visit (INDEPENDENT_AMBULATORY_CARE_PROVIDER_SITE_OTHER): Payer: BLUE CROSS/BLUE SHIELD | Admitting: Internal Medicine

## 2016-08-17 ENCOUNTER — Encounter: Payer: Self-pay | Admitting: Gastroenterology

## 2016-08-17 ENCOUNTER — Encounter: Payer: Self-pay | Admitting: Internal Medicine

## 2016-08-17 ENCOUNTER — Ambulatory Visit (INDEPENDENT_AMBULATORY_CARE_PROVIDER_SITE_OTHER): Payer: BLUE CROSS/BLUE SHIELD | Admitting: Gastroenterology

## 2016-08-17 VITALS — BP 134/82 | HR 79 | Wt 241.0 lb

## 2016-08-17 VITALS — BP 116/74 | HR 90 | Ht 65.0 in | Wt 241.0 lb

## 2016-08-17 DIAGNOSIS — E039 Hypothyroidism, unspecified: Secondary | ICD-10-CM

## 2016-08-17 DIAGNOSIS — D509 Iron deficiency anemia, unspecified: Secondary | ICD-10-CM | POA: Diagnosis not present

## 2016-08-17 DIAGNOSIS — E1140 Type 2 diabetes mellitus with diabetic neuropathy, unspecified: Secondary | ICD-10-CM

## 2016-08-17 LAB — CBC WITH DIFFERENTIAL/PLATELET
Basophils Absolute: 0.1 10*3/uL (ref 0.0–0.1)
Basophils Relative: 0.6 % (ref 0.0–3.0)
Eosinophils Absolute: 0.1 10*3/uL (ref 0.0–0.7)
Eosinophils Relative: 1.3 % (ref 0.0–5.0)
HCT: 40 % (ref 36.0–46.0)
Hemoglobin: 13.3 g/dL (ref 12.0–15.0)
Lymphocytes Relative: 24.8 % (ref 12.0–46.0)
Lymphs Abs: 2.3 10*3/uL (ref 0.7–4.0)
MCHC: 33.2 g/dL (ref 30.0–36.0)
MCV: 92.5 fl (ref 78.0–100.0)
Monocytes Absolute: 0.7 10*3/uL (ref 0.1–1.0)
Monocytes Relative: 8.2 % (ref 3.0–12.0)
Neutro Abs: 5.9 10*3/uL (ref 1.4–7.7)
Neutrophils Relative %: 65.1 % (ref 43.0–77.0)
Platelets: 254 10*3/uL (ref 150.0–400.0)
RBC: 4.33 Mil/uL (ref 3.87–5.11)
RDW: 15 % (ref 11.5–15.5)
WBC: 9.1 10*3/uL (ref 4.0–10.5)

## 2016-08-17 LAB — POCT GLYCOSYLATED HEMOGLOBIN (HGB A1C): Hemoglobin A1C: 5.8

## 2016-08-17 NOTE — Progress Notes (Signed)
Patient ID: Alexandria [name], female   DOB: 1935-10-14, 81 y.o.   MRN: [id number]  HPI: Alexandria [name] is a 81 y.o.-year-old female, returning for follow-up for DM2, dx in 2006, non-insulin-dependent, controlled, with complications (mild CKD) and hypothyroidism. Last visit 4 mo ago.  She started on O2 2 lpm.  DM2: Last hemoglobin A1c was: Lab Results  Component Value Date   HGBA1C 5.6 04/08/2016   HGBA1C 5.7 04/07/2016   HGBA1C 6.4 01/01/2016   Pt is on: - Metformin 500 mg with supper She was on Januvia >> stopped in summer 2016, when she was admitted for sepsis  Pt checks her sugars 1x a day: - am: 109-130 >> 120, 137 >> 126-147, 157 >> 131-137 >> 127-145 - 2h after brunch: 110-130, 189 >> 156-184 >> 120-186, 221 >> 241 - 3-4h after brunch: 202 >> 112-156, 185 >> 126-195 >> 112-172 >> 189 - before dinner: 103 >> n/c >> 100-151 >> n/c - 2h after dinner: 130-166 >> 151, 157 >> 106-173, 188 >> 114-120 >> 167, 198 - bedtime: 135 >> 132-197, 251x1 >> 163-193, 226 >> 124, 147 - nighttime: n/c >> 169, 175 >> n/c No lows. Lowest sugar was 100 >> 112 >> 106 >> 114 >> 124; she has hypoglycemia awareness at 70.  Highest sugar was 260 >> 257 >> 226 >> 241.  Glucometer:One Touch Ultra mini  Pt's meals are: - Breakfast: protein drink, egg, cereals - Lunch: sandwich - Dinner: meat + 2 veggies - Snacks: 1-3 peanut butter, milk, crackers  - + mild CKD, last BUN/creatinine:  Lab Results  Component Value Date   BUN 25 (H) 06/16/2016   CREATININE 1.02 06/16/2016  On Losartan. - last set of lipids: Lab Results  Component Value Date   CHOL 165 01/01/2016   HDL 65.70 01/01/2016   LDLCALC 80 01/01/2016   LDLDIRECT 63.0 07/29/2015   TRIG 97.0 01/01/2016   CHOLHDL 3 01/01/2016  On Crestor, Zetia. - last eye exam was: 01/2016. No DR. - she denies numbness and tingling in her feet.  She also has a h/o hypothyroidism.  Pt is on Synthroid DAW 125 mcg daily, taken: - in am - fasting -  at least 1-1.5 hrs from b'fast - no Ca, MVI - + PPIs 1-1.5 hrs after LT4 - + Fe 1-1.5 hrs after LT4 - not on Biotin  Last TSH reviewed >> normal: Lab Results  Component Value Date   TSH 0.93 01/01/2016   She has a h/o COPD - Dr. Halford Chessman, HL, HTN, anemia, GERD.  ROS: Constitutional: no weight gain/no weight loss, + fatigue, no subjective hyperthermia, no subjective hypothermia, + nocturia Eyes: no blurry vision, no xerophthalmia ENT: no sore throat, no nodules palpated in throat, no dysphagia, no odynophagia, no hoarseness Cardiovascular: no CP/no SOB/no palpitations/+ leg swelling Respiratory: no cough/no SOB/no wheezing Gastrointestinal: no N/no V/no D/no C/no acid reflux Musculoskeletal: no muscle aches/no joint aches Skin: no rashes, no hair loss Neurological: no tremors/no numbness/no tingling/no dizziness  I reviewed pt's medications, allergies, PMH, social hx, family hx, and changes were documented in the history of present illness. Otherwise, unchanged from my initial visit note.  Past Medical History:  Diagnosis Date  . Allergy, unspecified not elsewhere classified   . Chronic diastolic congestive heart failure ([place])   . COPD (chronic obstructive pulmonary disease) ([place]) 03/08/1998   PFTs 12/12/2002 FEV 1 1.42 (64%) ratio 58 with no better after B2 and DLCO75%; PFTs 11/20/09 FEV1 1.50 (73%) ratio 50 no better  after B2 with DLCO 62%; Hfa 50% 11/20/2009 >75%, 01/13/10 p coaching  . Depression 12/07/1998  . Diabetes mellitus type II 02/05/2006   Dr. Elyse Hsu with endo  . Disorders of bursae and tendons in shoulder region, unspecified    Rotator cuff syndrome, right  . E. coli bacteremia   . Esophagitis   . GERD (gastroesophageal reflux disease)   . History of UTI   . Hyperlipidemia 10/04/2000  . Hypertension 03/08/1992  . Hypothyroidism 03/08/1968  . Iron deficiency anemia   . Obesity    NOS  . OSA (obstructive sleep apnea)    PSG 01/27/10 AHI 13, pt does not know CPAP  settings  . Peripheral neuropathy    Likely due to DM per Dr. Erling Cruz  . Ventral hernia    Past Surgical History:  Procedure Laterality Date  . ABD U/S  03/19/1999   Nml x2 foci in liver  . ADENOSINE MYOVIEW  06/02/2007   Nml  . CARDIOLITE PERSANTINE  08/24/2000   Nml  . CAROTID U/S  08/24/2000   1-39% ICA stenosis  . CAROTID U/S  06/02/2007   No apprec change   . CARPAL TUNNEL RELEASE  12/1997   Right  . CESAREAN SECTION     x2 Breech/ repeat  . CHOLECYSTECTOMY  1997  . COLONOSCOPY WITH PROPOFOL N/A 02/12/2016   Procedure: COLONOSCOPY WITH PROPOFOL;  Surgeon: Milus Banister, MD;  Location: [facility];  Service: Endoscopy;  Laterality: N/A;  . CT ABD W & PELVIS WO/W CM     Abd hemangiomas of liver, 1 cm R renal cyst  . DENTAL SURGERY  2016   Implants  . DEXA  07/03/2003   Nml  . ESOPHAGOGASTRODUODENOSCOPY  12/05/1997   Nml (due to hoarseness)  . ESOPHAGOGASTRODUODENOSCOPY (EGD) WITH PROPOFOL N/A 02/12/2016   Procedure: ESOPHAGOGASTRODUODENOSCOPY (EGD) WITH PROPOFOL;  Surgeon: Milus Banister, MD;  Location: [facility];  Service: Endoscopy;  Laterality: N/A;  . GALLBLADDER SURGERY    . HERNIA REPAIR  01/24/2009   Lap Ventr w/ Lysis of adhesions (Dr. Donne Hazel)  . knee arthroscopic surgery  years ago   right  . ROTATOR CUFF REPAIR  1984   Right, [place]  . SHOULDER OPEN ROTATOR CUFF REPAIR  02/08/2012   Procedure: ROTATOR CUFF REPAIR SHOULDER OPEN;  Surgeon: Magnus Sinning, MD;  Location: [facility];  Service: Orthopedics;  Laterality: Left;  Left Shoulder Open Anterior Acrominectomy Rotator Cuff Repair Open Distal Clavicle Resection ,tissue mend graft, and repair of biceps tendon  . THUMB RELEASE  12/1997   Right  . TONSILLECTOMY    . TOTAL ABDOMINAL HYSTERECTOMY  1985   Due to dysmennorhea  . US ECHOCARDIOGRAPHY  06/02/2007   Social History   Occupational History  . Retired - self employed- Engineer, structural    Social History Main Topics  . Smoking status:  Former Smoker -- 2.00 packs/day for 50 years    Types: Cigarettes    Quit date: 2007  . Smokeless tobacco: Never Used  . Alcohol Use: 1.2 oz/week    2 Standard drinks or equivalent per week     Comment: occasional  . Drug Use: No  . Sexual Activity: Not on file   Social History Narrative   Married with 2 children   Enjoys painting   Current Outpatient Prescriptions on File Prior to Visit  Medication Sig Dispense Refill  . Coenzyme Q-10 200 MG CAPS Take 200 mg by mouth daily.     Marland Kitchen EPINEPHrine (  EPIPEN) 0.3 mg/0.3 mL DEVI Inject 0.3 mg into the muscle once as needed (anaphylaxis).     Marland Kitchen esomeprazole (NEXIUM) 40 MG capsule Take 40 mg by mouth daily at 12 noon.    . ezetimibe (ZETIA) 10 MG tablet Take 1 tablet (10 mg total) by mouth at bedtime.    . ferrous sulfate 325 (65 FE) MG EC tablet Take 1 tablet (325 mg total) by mouth daily with breakfast.    . FLUoxetine (PROZAC) 10 MG capsule TAKE 3 CAPSULES IN THE MORNING AND TAKE 3 CAPSULES IN THE EVENING 540 capsule 3  . fluticasone (FLONASE) 50 MCG/ACT nasal spray USE 2 SPRAYS INTO THE NOSE EVERY 12 HOURS AS NEEDED FOR STUFFY NOSE 48 g 1  . furosemide (LASIX) 20 MG tablet Take 1 tablet (20 mg total) by mouth daily as needed. (Patient taking differently: Take 20 mg by mouth daily as needed for fluid. ) 90 tablet 3  . ibuprofen (ADVIL,MOTRIN) 200 MG tablet Take 200-400 mg by mouth every 6 (six) hours as needed for moderate pain.     Marland Kitchen LORazepam (ATIVAN) 0.5 MG tablet TAKE 1 TABLET BY MOUTH TWICE DAILY AS NEEDED. DNF 01/26/16 180 tablet 0  . metFORMIN (GLUCOPHAGE) 500 MG tablet Take 1 tablet (500 mg total) by mouth daily with supper. 90 tablet 3  . nitroGLYCERIN (NITROSTAT) 0.4 MG SL tablet Place 1 tablet (0.4 mg total) under the tongue every 5 (five) minutes as needed for chest pain. 25 tablet 3  . potassium chloride (K-DUR) 10 MEQ tablet Take 1 tablet (10 mEq total) by mouth daily as needed. (Patient taking differently: Take 10 mEq by mouth  daily as needed (Takes with furosemide doses.). ) 90 tablet 3  . PROAIR HFA 108 (90 Base) MCG/ACT inhaler INHALE 2 PUFFS INTO THE LUNGS EVERY 6 HOURS AS NEEDED FOR WHEEZING OR SHORTNESS OF BREATH 1 Inhaler 2  . rosuvastatin (CRESTOR) 10 MG tablet Take 1 tablet (10 mg total) by mouth every other day. 90 tablet 1  . SYMBICORT 160-4.5 MCG/ACT inhaler INHALE 2 PUFFS FIRST THING IN THE MORNING AND 2 PUFFS AGAIN IN THE EVENING ABOUT 12 HOURS LATER 30.6 Inhaler 2  . SYNTHROID 125 MCG tablet TAKE 1 TABLET (125 MCG TOTAL) BY MOUTH DAILY BEFORE BREAKFAST. 90 tablet 1  . umeclidinium bromide (INCRUSE ELLIPTA) 62.5 MCG/INH AEPB Inhale 1 puff into the lungs daily.    . valsartan (DIOVAN) 160 MG tablet Take 1 tablet (160 mg total) by mouth 2 (two) times daily. 180 tablet 3  . vitamin B-12 (CYANOCOBALAMIN) 50 MCG tablet Take 50 mcg by mouth daily.      No current facility-administered medications on file prior to visit.    Allergies  Allergen Reactions  . Antihistamines, Diphenhydramine-Type Other (See Comments)    Throat closes up.  Does not take any antihistamines.  . Sulfonamide Derivatives Swelling    REACTION: closed throat  . Incruse Ellipta [Umeclidinium Bromide] Cough    Caused voice change and severe coughing  . Clarithromycin Other (See Comments)    REACTION: diff swallowing and mouth blisters  . Codeine Other (See Comments)    Unknown   . Spiriva Handihaler [Tiotropium Bromide Monohydrate] Other (See Comments)    Voice changes   Family History  Problem Relation Age of Onset  . Heart disease Mother   . Thyroid disease Mother   . Emphysema Father        One lung  . Breast cancer Sister   . Cystic fibrosis  Sister   . Hyperthyroidism Sister   . Osteoarthritis Brother   . Hyperthyroidism Brother   . Esophageal cancer Neg Hx   . Cancer Neg Hx        Head or neck  . Colon cancer Neg Hx   . Stomach cancer Neg Hx    PE: BP 134/82 (BP Location: Left Arm, Patient Position: Sitting)    Pulse 79   Wt 241 lb (109.3 kg)   SpO2 94% Comment: 2L  BMI 40.10 kg/m  Body mass index is 40.1 kg/m.  Wt Readings from Last 3 Encounters:  08/17/16 241 lb (109.3 kg)  08/08/16 239 lb (108.4 kg)  07/14/16 240 lb 12.8 oz (109.2 kg)   Constitutional: overweight, in NAD, + O2 Eyes: PERRLA, EOMI, no exophthalmos ENT: moist mucous membranes, no thyromegaly, no cervical lymphadenopathy Cardiovascular: RRR, No RG, +2/6 SEM Respiratory: CTA B Gastrointestinal: abdomen soft, NT, ND, BS+ Musculoskeletal: no deformities, strength intact in all 4 Skin: moist, warm, + multiple ecchimoses Neurological: + mild tremor with outstretched hands, DTR normal in all 4  ASSESSMENT: 1. DM2, non-insulin-dependent, controlled, with complications - mild CKD  2. Hypothyroidism  PLAN:  1. Patient with long-standing, controlled diabetes, off oral antidiabetic regimen (Januvia) after her sepsis hospitalization in 2016. She still has good DM control only on metformin low dose. Sugars are occasionally above goal, but she usually checks after meals >> will advised her to start checking before meals - today, HbA1c is 5.8%  - continue checking sugars at different times of the day - check 1x a day, rotating checks  - advised for yearly eye exams >> she is UTD - Return to clinic in 4 mo with sugar log   2. Hypothyroidism - latest thyroid labs reviewed with pt >> normal  - she continues on LT4 125 mcg daily - pt feels good on this dose. - we discussed about taking the thyroid hormone every day, with water, >30 minutes before breakfast, separated by >4 hours from acid reflux medications, calcium, iron, multivitamins. Pt. is not taking it correctly >> takes Prevacid and iron <4h after LT4 >> advised to move them later - will check thyroid tests in 1.5 mo: TSH and fT4 - RTC in 4 mo  Philemon Kingdom, MD PhD [facility]

## 2016-08-17 NOTE — Patient Instructions (Addendum)
You will have labs checked today in the basement lab.  Please head down after you check out with the front desk  (cbc).  STay on iron, one pill once daily.  Normal BMI (Body Mass Index- based on height and weight) is between 23 and 30. Your BMI today is Body mass index is 40.1 kg/m. Marland Kitchen Please consider follow up  regarding your BMI with your Primary Care Provider.

## 2016-08-17 NOTE — Patient Instructions (Addendum)
Please continue Synthroid 125 mcg daily.  Take the thyroid hormone every day, with water, at least 30 minutes before breakfast, separated by at least 4 hours from: - acid reflux medications - calcium - iron - multivitamins  Move the Prevacid and the iron at least 4h after Synthroid.  Check sugars 1x a day, before meals.  Please continue: - Metformin 500 mg with supper  Please return in 4 months with your sugar log.

## 2016-08-17 NOTE — Progress Notes (Signed)
Review of pertinent gastrointestinal problems: 1. IDA workup: 02/2016 colonoscopy Dr. Ardis Hughs diverticulosis, hemorrhoids, lipoma, no polyps. 02/2016 EGD small HH.  Given age (65) and  multiple comorbid conditions I recommended following blood counts, staying on iron daily.  Repeat Hb 06/2016 up to 13.1 (MCV 14), previous Hb 12/2015 Hb 9.8 (MCV 76).    HPI: This is a very pleasant  81 year old woman whom I last saw time of her colonoscopy, upper endoscopy. See those results summarized above  Chief complaint is iron deficiency anemia  She is on oxygen 24/7.  She is morbidly obese with a BMI 40  She has a lot of fatigue.  No overt bleeding.  She takes iron daily, still.  Can have dark stools at times.  ROS: complete GI ROS as described in HPI, all other review negative.  Weight unchanged vs 7 months ago (same scale here in GI office).  Constitutional:  No unintentional weight loss   Past Medical History:  Diagnosis Date  . Allergy, unspecified not elsewhere classified   . Chronic diastolic congestive heart failure ([place])   . COPD (chronic obstructive pulmonary disease) ([place]) 03/08/1998   PFTs 12/12/2002 FEV 1 1.42 (64%) ratio 58 with no better after B2 and DLCO75%; PFTs 11/20/09 FEV1 1.50 (73%) ratio 50 no better after B2 with DLCO 62%; Hfa 50% 11/20/2009 >75%, 01/13/10 p coaching  . Depression 12/07/1998  . Diabetes mellitus type II 02/05/2006   Dr. Elyse Hsu with endo  . Disorders of bursae and tendons in shoulder region, unspecified    Rotator cuff syndrome, right  . E. coli bacteremia   . Esophagitis   . GERD (gastroesophageal reflux disease)   . History of UTI   . Hyperlipidemia 10/04/2000  . Hypertension 03/08/1992  . Hypothyroidism 03/08/1968  . Iron deficiency anemia   . Obesity    NOS  . OSA (obstructive sleep apnea)    PSG 01/27/10 AHI 13, pt does not know CPAP settings  . Peripheral neuropathy    Likely due to DM per Dr. Erling Cruz  . Ventral hernia     Past Surgical  History:  Procedure Laterality Date  . ABD U/S  03/19/1999   Nml x2 foci in liver  . ADENOSINE MYOVIEW  06/02/2007   Nml  . CARDIOLITE PERSANTINE  08/24/2000   Nml  . CAROTID U/S  08/24/2000   1-39% ICA stenosis  . CAROTID U/S  06/02/2007   No apprec change   . CARPAL TUNNEL RELEASE  12/1997   Right  . CESAREAN SECTION     x2 Breech/ repeat  . CHOLECYSTECTOMY  1997  . COLONOSCOPY WITH PROPOFOL N/A 02/12/2016   Procedure: COLONOSCOPY WITH PROPOFOL;  Surgeon: Milus Banister, MD;  [facility];  Service: Endoscopy;  Laterality: N/A;  . CT ABD W & PELVIS WO/W CM     Abd hemangiomas of liver, 1 cm R renal cyst  . DENTAL SURGERY  2016   Implants  . DEXA  07/03/2003   Nml  . ESOPHAGOGASTRODUODENOSCOPY  12/05/1997   Nml (due to hoarseness)  . ESOPHAGOGASTRODUODENOSCOPY (EGD) WITH PROPOFOL N/A 02/12/2016   Procedure: ESOPHAGOGASTRODUODENOSCOPY (EGD) WITH PROPOFOL;  Surgeon: Milus Banister, MD;  [facility];  Service: Endoscopy;  Laterality: N/A;  . GALLBLADDER SURGERY    . HERNIA REPAIR  01/24/2009   Lap Ventr w/ Lysis of adhesions (Dr. Donne Hazel)  . knee arthroscopic surgery  years ago   right  . ROTATOR CUFF REPAIR  1984   Right, [place]  .  SHOULDER OPEN ROTATOR CUFF REPAIR  02/08/2012   Procedure: ROTATOR CUFF REPAIR SHOULDER OPEN;  Surgeon: Magnus Sinning, MD;  Location: [facility];  Service: Orthopedics;  Laterality: Left;  Left Shoulder Open Anterior Acrominectomy Rotator Cuff Repair Open Distal Clavicle Resection ,tissue mend graft, and repair of biceps tendon  . THUMB RELEASE  12/1997   Right  . TONSILLECTOMY    . TOTAL ABDOMINAL HYSTERECTOMY  1985   Due to dysmennorhea  . US ECHOCARDIOGRAPHY  06/02/2007    Current Outpatient Prescriptions  Medication Sig Dispense Refill  . Coenzyme Q-10 200 MG CAPS Take 200 mg by mouth daily.     Marland Kitchen EPINEPHrine (EPIPEN) 0.3 mg/0.3 mL DEVI Inject 0.3 mg into the muscle once as needed (anaphylaxis).     Marland Kitchen  esomeprazole (NEXIUM) 40 MG capsule Take 40 mg by mouth daily at 12 noon.    . ezetimibe (ZETIA) 10 MG tablet Take 1 tablet (10 mg total) by mouth at bedtime.    . ferrous sulfate 325 (65 FE) MG EC tablet Take 1 tablet (325 mg total) by mouth daily with breakfast.    . FLUoxetine (PROZAC) 10 MG capsule TAKE 3 CAPSULES IN THE MORNING AND TAKE 3 CAPSULES IN THE EVENING 540 capsule 3  . fluticasone (FLONASE) 50 MCG/ACT nasal spray USE 2 SPRAYS INTO THE NOSE EVERY 12 HOURS AS NEEDED FOR STUFFY NOSE 48 g 1  . furosemide (LASIX) 20 MG tablet Take 1 tablet (20 mg total) by mouth daily as needed. (Patient taking differently: Take 20 mg by mouth daily as needed for fluid. ) 90 tablet 3  . ibuprofen (ADVIL,MOTRIN) 200 MG tablet Take 200-400 mg by mouth every 6 (six) hours as needed for moderate pain.     Marland Kitchen LORazepam (ATIVAN) 0.5 MG tablet TAKE 1 TABLET BY MOUTH TWICE DAILY AS NEEDED. DNF 01/26/16 180 tablet 0  . metFORMIN (GLUCOPHAGE) 500 MG tablet Take 1 tablet (500 mg total) by mouth daily with supper. 90 tablet 3  . nitroGLYCERIN (NITROSTAT) 0.4 MG SL tablet Place 1 tablet (0.4 mg total) under the tongue every 5 (five) minutes as needed for chest pain. 25 tablet 3  . potassium chloride (K-DUR) 10 MEQ tablet Take 1 tablet (10 mEq total) by mouth daily as needed. (Patient taking differently: Take 10 mEq by mouth daily as needed (Takes with furosemide doses.). ) 90 tablet 3  . PROAIR HFA 108 (90 Base) MCG/ACT inhaler INHALE 2 PUFFS INTO THE LUNGS EVERY 6 HOURS AS NEEDED FOR WHEEZING OR SHORTNESS OF BREATH 1 Inhaler 2  . rosuvastatin (CRESTOR) 10 MG tablet Take 1 tablet (10 mg total) by mouth every other day. 90 tablet 1  . SYMBICORT 160-4.5 MCG/ACT inhaler INHALE 2 PUFFS FIRST THING IN THE MORNING AND 2 PUFFS AGAIN IN THE EVENING ABOUT 12 HOURS LATER 30.6 Inhaler 2  . SYNTHROID 125 MCG tablet TAKE 1 TABLET (125 MCG TOTAL) BY MOUTH DAILY BEFORE BREAKFAST. 90 tablet 1  . umeclidinium bromide (INCRUSE ELLIPTA)  62.5 MCG/INH AEPB Inhale 1 puff into the lungs daily.    . valsartan (DIOVAN) 160 MG tablet Take 1 tablet (160 mg total) by mouth 2 (two) times daily. 180 tablet 3  . vitamin B-12 (CYANOCOBALAMIN) 50 MCG tablet Take 50 mcg by mouth daily.      No current facility-administered medications for this visit.     Allergies as of 08/17/2016 - Review Complete 08/17/2016  Allergen Reaction Noted  . Antihistamines, diphenhydramine-type Other (See Comments) 02/09/2016  .  Sulfonamide derivatives Swelling 07/12/2006  . Incruse ellipta [umeclidinium bromide] Cough 01/21/2016  . Clarithromycin Other (See Comments) 05/23/2007  . Codeine Other (See Comments) 07/12/2006  . Spiriva handihaler [tiotropium bromide monohydrate] Other (See Comments) 06/13/2014    Family History  Problem Relation Age of Onset  . Heart disease Mother   . Thyroid disease Mother   . Emphysema Father        One lung  . Breast cancer Sister   . Cystic fibrosis Sister   . Hyperthyroidism Sister   . Osteoarthritis Brother   . Hyperthyroidism Brother   . Esophageal cancer Neg Hx   . Cancer Neg Hx        Head or neck  . Colon cancer Neg Hx   . Stomach cancer Neg Hx     Social History   Social History  . Marital status: Married    Spouse name: N/A  . Number of children: 2  . Years of education: N/A   Occupational History  . Retired Retired   Social History Main Topics  . Smoking status: Former Smoker    Packs/day: 2.00    Years: 50.00    Types: Cigarettes    Quit date: 02/05/2005  . Smokeless tobacco: Never Used  . Alcohol use 1.2 oz/week    2 Standard drinks or equivalent per week     Comment: occasional  . Drug use: No  . Sexual activity: Not Currently   Other Topics Concern  . Not on file   Social History Narrative   Married with 2 children   Enjoys painting     Physical Exam: BP 116/74   Pulse 90   Ht 5\' 5"  (1.651 m)   Wt 241 lb (109.3 kg)   BMI 40.10 kg/m  Constitutional: Chronically ill,  wearing oxygen nasal cannula, morbidly obese Psychiatric: alert and oriented x3 Abdomen: soft, nontender, nondistended, no obvious ascites, no peritoneal signs, normal bowel sounds No peripheral edema noted in lower extremities  Assessment and plan: 81 y.o. female with iron deficiency anemia  Her blood counts were last checked April and her hemoglobin was normal. Her MCV is normal. Given her multiple serious comorbidities including 24 hour oxygen, morbid obesity and recommended against any further GI workup since her blood counts are stable while she takes iron once daily. She should continue taking iron once daily indefinitely. I will recheck CBC today and I will communicate those results with her.  Please see the "Patient Instructions" section for addition details about the plan.  Owens Loffler, MD [facility] 08/17/2016, 9:02 AM

## 2016-08-17 NOTE — Addendum Note (Signed)
Addended by: Caprice Beaver T on: 08/17/2016 03:36 PM   Modules accepted: Orders

## 2016-08-25 ENCOUNTER — Other Ambulatory Visit: Payer: Self-pay | Admitting: Family Medicine

## 2016-09-02 ENCOUNTER — Other Ambulatory Visit: Payer: Self-pay | Admitting: Family Medicine

## 2016-09-02 DIAGNOSIS — R062 Wheezing: Secondary | ICD-10-CM

## 2016-09-09 ENCOUNTER — Telehealth: Payer: Self-pay | Admitting: Family Medicine

## 2016-09-09 NOTE — Telephone Encounter (Signed)
I have no idea about anyone's availability with orthopedics. I don't know if we can get her in on the specific day she asks for at another clinic, especially since we haven't even evaluated her for the problem the first place.  Offer her an office visit here or she can call Dr. Pearla Dubonnet old office since she was a patient there to get an appointment. Thanks.

## 2016-09-09 NOTE — Telephone Encounter (Signed)
Patient's orthopaedic doctor,Dr.[place],retired 5 years ago. Patient hasn't had any problems since then.  Patient's out of town and she'll be back on Monday night.  Patient is having severe leg and foot pain.  Patient can walk holding on to something.  Patient wants an appointment scheduled with an orthopaedic on Tuesday, July 10th.  Patient prefers [facility], but will go to [facility].

## 2016-09-10 NOTE — Telephone Encounter (Signed)
Appointment scheduled on Tuesday, July 10.

## 2016-09-10 NOTE — Telephone Encounter (Signed)
Left detailed message on voicemail.  

## 2016-09-14 ENCOUNTER — Ambulatory Visit (INDEPENDENT_AMBULATORY_CARE_PROVIDER_SITE_OTHER)
Admission: RE | Admit: 2016-09-14 | Discharge: 2016-09-14 | Disposition: A | Discharge status: Home / Self Care | Payer: BLUE CROSS/BLUE SHIELD | Source: Ambulatory Visit | Attending: Family Medicine | Admitting: Family Medicine

## 2016-09-14 ENCOUNTER — Ambulatory Visit (INDEPENDENT_AMBULATORY_CARE_PROVIDER_SITE_OTHER): Payer: BLUE CROSS/BLUE SHIELD | Admitting: Family Medicine

## 2016-09-14 ENCOUNTER — Encounter: Payer: Self-pay | Admitting: Family Medicine

## 2016-09-14 VITALS — BP 144/68 | HR 78 | Temp 97.7°F | Wt 241.8 lb

## 2016-09-14 DIAGNOSIS — G8929 Other chronic pain: Secondary | ICD-10-CM

## 2016-09-14 DIAGNOSIS — M25562 Pain in left knee: Secondary | ICD-10-CM | POA: Diagnosis not present

## 2016-09-14 DIAGNOSIS — M79671 Pain in right foot: Secondary | ICD-10-CM

## 2016-09-14 DIAGNOSIS — J9611 Chronic respiratory failure with hypoxia: Secondary | ICD-10-CM | POA: Diagnosis not present

## 2016-09-14 NOTE — Patient Instructions (Signed)
Use ice on your right foot, 5 minutes on and 5 minutes off.  Go to the lab on the way out.  We'll contact you with your xray report. We'll go from there.  Take care.  Glad to see you.

## 2016-09-14 NOTE — Progress Notes (Signed)
She is avoiding the heat with her O2 dependence.   Her breathing is better on O2.  She hasn't started incruse yet, d/w pt, she only wanted to make one change at a time.  She was willing to consider retrial of medicine in the future.  I'll defer to patient and pulmonary.    She had a lot of pain at the R1st MTP.  More puffy prev, better as she was resting more.  No trauma recently but h/o neuropathy- though this was a significantly different type of pain.  Sig pain prev, but not ttp.  It didn't turn red at the MTP.  No h/o gout.  She tried changing her shoes and that helped some.  She has some pain with doriflexion of the R1st toe.  No ankle pain.  No heel pain.    B knee pain at baseline.  She feels something move inferior and lateral to either patella.  No recent trauma.  She had fallen about a year ago, unclear if that contributed.  L>R knee pain.    nad ncat rrr ctab but global dec in BS abd soft B knees with normal ROM but crepitus on exam.  Joint lines not ttp B but R knee ttp at 2 o'clock, L knee ttp at 4 o'clock. No locking.   R foot with normal inspection, no erythema, not ttp at the ankle for foot.  Pain with resisted foot dorsiflexion but not resisted toe dorsiflexion.  Able to bear weight.

## 2016-09-15 ENCOUNTER — Telehealth: Payer: Self-pay | Admitting: Family Medicine

## 2016-09-15 DIAGNOSIS — M79671 Pain in right foot: Secondary | ICD-10-CM | POA: Insufficient documentation

## 2016-09-15 DIAGNOSIS — G8929 Other chronic pain: Secondary | ICD-10-CM | POA: Insufficient documentation

## 2016-09-15 DIAGNOSIS — M25562 Pain in left knee: Principal | ICD-10-CM

## 2016-09-15 NOTE — Assessment & Plan Note (Signed)
I'll defer to pulmonary and patient.

## 2016-09-15 NOTE — Assessment & Plan Note (Signed)
Likely chronic OA with episodic flare, d/w pt.  See notes on imaging.  I checked plain films and I agreed with overread, see result note.  >25 minutes spent in face to face time with patient, >50% spent in counselling or coordination of care.

## 2016-09-15 NOTE — Telephone Encounter (Signed)
Pt called - she is requesting call about xrays. Please call [phone]

## 2016-09-15 NOTE — Telephone Encounter (Signed)
Patient called to get x-ray results.

## 2016-09-15 NOTE — Assessment & Plan Note (Addendum)
Likely tendonitis, not gout, no MTP swelling or erythema.  See notes on imaging.  I checked plain films and I agreed with overread, see result note.  See AVS.

## 2016-09-15 NOTE — Telephone Encounter (Signed)
Returned patient's call and test results were given to her.

## 2016-09-16 ENCOUNTER — Other Ambulatory Visit: Payer: Self-pay | Admitting: *Deleted

## 2016-09-16 MED ORDER — VALSARTAN 160 MG PO TABS: 160.0000 mg | ORAL_TABLET | Freq: Two times a day (BID) | ORAL | 3 refills | Status: DC

## 2016-09-20 DIAGNOSIS — M25562 Pain in left knee: Secondary | ICD-10-CM | POA: Diagnosis not present

## 2016-09-20 DIAGNOSIS — M79671 Pain in right foot: Secondary | ICD-10-CM | POA: Diagnosis not present

## 2016-09-20 DIAGNOSIS — G8929 Other chronic pain: Secondary | ICD-10-CM | POA: Diagnosis not present

## 2016-09-20 DIAGNOSIS — M25561 Pain in right knee: Secondary | ICD-10-CM | POA: Diagnosis not present

## 2016-10-01 ENCOUNTER — Emergency Department (HOSPITAL_COMMUNITY)
Admission: EM | Admit: 2016-10-01 | Discharge: 2016-10-01 | Disposition: A | Discharge status: Home / Self Care | Payer: BLUE CROSS/BLUE SHIELD | Attending: Emergency Medicine | Admitting: Emergency Medicine

## 2016-10-01 ENCOUNTER — Emergency Department (HOSPITAL_COMMUNITY): Payer: BLUE CROSS/BLUE SHIELD

## 2016-10-01 DIAGNOSIS — W109XXA Fall (on) (from) unspecified stairs and steps, initial encounter: Secondary | ICD-10-CM | POA: Insufficient documentation

## 2016-10-01 DIAGNOSIS — S01112A Laceration without foreign body of left eyelid and periocular area, initial encounter: Secondary | ICD-10-CM | POA: Diagnosis not present

## 2016-10-01 DIAGNOSIS — Y939 Activity, unspecified: Secondary | ICD-10-CM | POA: Insufficient documentation

## 2016-10-01 DIAGNOSIS — Z87891 Personal history of nicotine dependence: Secondary | ICD-10-CM | POA: Diagnosis not present

## 2016-10-01 DIAGNOSIS — S0990XA Unspecified injury of head, initial encounter: Secondary | ICD-10-CM | POA: Insufficient documentation

## 2016-10-01 DIAGNOSIS — T07XXXA Unspecified multiple injuries, initial encounter: Secondary | ICD-10-CM | POA: Diagnosis present

## 2016-10-01 DIAGNOSIS — I10 Essential (primary) hypertension: Secondary | ICD-10-CM | POA: Insufficient documentation

## 2016-10-01 DIAGNOSIS — S81011A Laceration without foreign body, right knee, initial encounter: Secondary | ICD-10-CM | POA: Diagnosis not present

## 2016-10-01 DIAGNOSIS — Y92009 Unspecified place in unspecified non-institutional (private) residence as the place of occurrence of the external cause: Secondary | ICD-10-CM

## 2016-10-01 DIAGNOSIS — Y999 Unspecified external cause status: Secondary | ICD-10-CM | POA: Diagnosis not present

## 2016-10-01 DIAGNOSIS — S0003XA Contusion of scalp, initial encounter: Secondary | ICD-10-CM | POA: Diagnosis not present

## 2016-10-01 DIAGNOSIS — E119 Type 2 diabetes mellitus without complications: Secondary | ICD-10-CM | POA: Insufficient documentation

## 2016-10-01 DIAGNOSIS — I509 Heart failure, unspecified: Secondary | ICD-10-CM | POA: Diagnosis not present

## 2016-10-01 DIAGNOSIS — S0993XA Unspecified injury of face, initial encounter: Secondary | ICD-10-CM | POA: Diagnosis not present

## 2016-10-01 DIAGNOSIS — S0181XA Laceration without foreign body of other part of head, initial encounter: Secondary | ICD-10-CM | POA: Insufficient documentation

## 2016-10-01 DIAGNOSIS — J449 Chronic obstructive pulmonary disease, unspecified: Secondary | ICD-10-CM | POA: Insufficient documentation

## 2016-10-01 DIAGNOSIS — S81012A Laceration without foreign body, left knee, initial encounter: Secondary | ICD-10-CM | POA: Insufficient documentation

## 2016-10-01 DIAGNOSIS — Y92099 Unspecified place in other non-institutional residence as the place of occurrence of the external cause: Secondary | ICD-10-CM | POA: Diagnosis not present

## 2016-10-01 DIAGNOSIS — R0789 Other chest pain: Secondary | ICD-10-CM | POA: Diagnosis not present

## 2016-10-01 DIAGNOSIS — R0781 Pleurodynia: Secondary | ICD-10-CM | POA: Diagnosis not present

## 2016-10-01 DIAGNOSIS — S199XXA Unspecified injury of neck, initial encounter: Secondary | ICD-10-CM | POA: Diagnosis not present

## 2016-10-01 DIAGNOSIS — Z79899 Other long term (current) drug therapy: Secondary | ICD-10-CM | POA: Insufficient documentation

## 2016-10-01 DIAGNOSIS — W19XXXA Unspecified fall, initial encounter: Secondary | ICD-10-CM

## 2016-10-01 MED ORDER — MORPHINE SULFATE (PF) 2 MG/ML IV SOLN
4.0000 mg | Freq: Once | INTRAVENOUS | Status: AC
  Administered 2016-10-01: 4 mg via INTRAVENOUS
  Filled 2016-10-01: qty 2

## 2016-10-01 MED ORDER — TETANUS-DIPHTH-ACELL PERTUSSIS 5-2.5-18.5 LF-MCG/0.5 IM SUSP
0.5000 mL | Freq: Once | INTRAMUSCULAR | Status: AC
  Administered 2016-10-01: 0.5 mL via INTRAMUSCULAR
  Filled 2016-10-01: qty 0.5

## 2016-10-01 MED ORDER — OXYCODONE-ACETAMINOPHEN 5-325 MG PO TABS
1.0000 | ORAL_TABLET | Freq: Once | ORAL | Status: AC
  Administered 2016-10-01: 1 via ORAL
  Filled 2016-10-01: qty 1

## 2016-10-01 MED ORDER — LIDOCAINE HCL (PF) 1 % IJ SOLN
20.0000 mL | Freq: Once | INTRAMUSCULAR | Status: AC
  Administered 2016-10-01: 20 mL
  Filled 2016-10-01: qty 30

## 2016-10-01 MED ORDER — OXYCODONE-ACETAMINOPHEN 5-325 MG PO TABS: 1.0000 | ORAL_TABLET | ORAL | 0 refills | Status: DC | PRN

## 2016-10-01 MED ORDER — CEPHALEXIN 500 MG PO CAPS: 500.0000 mg | ORAL_CAPSULE | Freq: Three times a day (TID) | ORAL | 0 refills | Status: DC

## 2016-10-01 MED ORDER — CEPHALEXIN 500 MG PO CAPS
1000.0000 mg | ORAL_CAPSULE | Freq: Once | ORAL | Status: AC
  Administered 2016-10-01: 1000 mg via ORAL
  Filled 2016-10-01: qty 2

## 2016-10-01 NOTE — ED Provider Notes (Signed)
[facility] DEPT Provider Note   CSN: [id number] Arrival date & time: 10/01/16  0049     History   Chief Complaint Chief Complaint  Patient presents with  . Fall  . Facial Laceration  . Extremity Laceration    HPI Alexandria [name] is a 81 y.o. female.  The history is provided by the patient.  Fall   She tripped and fell at home, landing on her right knee. She has been having difficulty with that leg with some swelling and redness, and was scheduled to see a physician regarding it. She also is complaining of pain in her right lateral rib cage and suffered a laceration by her right eye. She denies loss of consciousness. She is not on any anticoagulants. She is unsure when her last tetanus immunization was.  Past Medical History:  Diagnosis Date  . Allergy, unspecified not elsewhere classified   . Chronic diastolic congestive heart failure ([place])   . COPD (chronic obstructive pulmonary disease) ([place]) 03/08/1998   PFTs 12/12/2002 FEV 1 1.42 (64%) ratio 58 with no better after B2 and DLCO75%; PFTs 11/20/09 FEV1 1.50 (73%) ratio 50 no better after B2 with DLCO 62%; Hfa 50% 11/20/2009 >75%, 01/13/10 p coaching  . Depression 12/07/1998  . Diabetes mellitus type II 02/05/2006   Dr. Elyse Hsu with endo  . Disorders of bursae and tendons in shoulder region, unspecified    Rotator cuff syndrome, right  . E. coli bacteremia   . Esophagitis   . GERD (gastroesophageal reflux disease)   . History of UTI   . Hyperlipidemia 10/04/2000  . Hypertension 03/08/1992  . Hypothyroidism 03/08/1968  . Iron deficiency anemia   . Obesity    NOS  . OSA (obstructive sleep apnea)    PSG 01/27/10 AHI 13, pt does not know CPAP settings  . Peripheral neuropathy    Likely due to DM per Dr. Erling Cruz  . Ventral hernia     Patient Active Problem List   Diagnosis Date Noted  . Chronic pain of left knee 09/15/2016  . Right foot pain 09/15/2016  . Chronic respiratory failure with hypoxia ([place]) 07/14/2016  .  Urinary frequency 04/07/2016  . Diverticulosis of colon without hemorrhage   . Hemorrhoids   . Hiatal hernia   . Colon cancer screening 01/21/2016  . Type 2 diabetes mellitus with diabetic neuropathy, without long-term current use of insulin ([place]) 04/04/2015  . Left temporal headache 03/14/2015  . Chest tightness 12/04/2014  . Chronic diastolic CHF (congestive heart failure) ([place])   . Iron deficiency anemia   . OSA on CPAP   . Obesity hypoventilation syndrome ([place])   . Chronic diastolic congestive heart failure ([place])   . Absolute anemia   . Medicare annual wellness visit, initial 06/14/2014  . Advance care planning 06/14/2014  . RLQ abdominal pain 05/10/2014  . COPD exacerbation ([place]) 04/28/2014  . SOB (shortness of breath) 08/24/2012  . Shoulder pain 12/17/2011  . Abdominal hernia 12/17/2011  . Anxiety 09/23/2011  . Osteopenia 07/25/2011  . Obstructive sleep apnea 02/09/2010  . MURMUR 02/04/2010  . CHEST PAIN 01/13/2010  . ONYCHOMYCOSIS, TOENAILS 06/30/2009  . PALPITATIONS 08/29/2008  . ESOPHAGEAL STENOSIS 11/22/2007  . DYSPHAGIA 11/08/2007  . ESOPHAGITIS 10/11/2007  . COPD GOLD II 05/23/2007  . ALLERGY 07/25/2006  . NUMBNESS 10/06/2001  . Hyperlipemia 10/04/2000  . DEPRESSION 03/08/1997  . Essential hypertension 03/08/1992  . Obesity 03/08/1989  . ROTATOR CUFF SYNDROME, RIGHT 03/08/1982  . Hypothyroidism 03/08/1968    Past Surgical  History:  Procedure Laterality Date  . ABD U/S  03/19/1999   Nml x2 foci in liver  . ADENOSINE MYOVIEW  06/02/2007   Nml  . CARDIOLITE PERSANTINE  08/24/2000   Nml  . CAROTID U/S  08/24/2000   1-39% ICA stenosis  . CAROTID U/S  06/02/2007   No apprec change   . CARPAL TUNNEL RELEASE  12/1997   Right  . CESAREAN SECTION     x2 Breech/ repeat  . CHOLECYSTECTOMY  1997  . COLONOSCOPY WITH PROPOFOL N/A 02/12/2016   Procedure: COLONOSCOPY WITH PROPOFOL;  Surgeon: Milus Banister, MD;  Location: [facility] ENDOSCOPY;  Service: Endoscopy;   Laterality: N/A;  . CT ABD W & PELVIS WO/W CM     Abd hemangiomas of liver, 1 cm R renal cyst  . DENTAL SURGERY  2016   Implants  . DEXA  07/03/2003   Nml  . ESOPHAGOGASTRODUODENOSCOPY  12/05/1997   Nml (due to hoarseness)  . ESOPHAGOGASTRODUODENOSCOPY (EGD) WITH PROPOFOL N/A 02/12/2016   Procedure: ESOPHAGOGASTRODUODENOSCOPY (EGD) WITH PROPOFOL;  Surgeon: Milus Banister, MD;  Location: [facility] ENDOS[facility];  Service: Endoscopy;  Laterality: N/A;  . GALLBLADDER SURGERY    . HERNIA REPAIR  01/24/2009   Lap Ventr w/ Lysis of adhesions (Dr. Donne Hazel)  . knee arthroscopic surgery  years ago   right  . ROTATOR CUFF REPAIR  1984   Right, [place]  . SHOULDER OPEN ROTATOR CUFF REPAIR  02/08/2012   Procedure: ROTATOR CUFF REPAIR SHOULDER OPEN;  Surgeon: Magnus Sinning, MD;  Location: [facility] ORS;  Service: Orthopedics;  Laterality: Left;  Left Shoulder Open Anterior Acrominectomy Rotator Cuff Repair Open Distal Clavicle Resection ,tissue mend graft, and repair of biceps tendon  . THUMB RELEASE  12/1997   Right  . TONSILLECTOMY    . TOTAL ABDOMINAL HYSTERECTOMY  1985   Due to dysmennorhea  . US ECHOCARDIOGRAPHY  06/02/2007    OB History    No data available       Home Medications    Prior to Admission medications   Medication Sig Start Date End Date Taking? Authorizing Provider  Coenzyme Q-10 200 MG CAPS Take 200 mg by mouth daily.     [provider]  EPINEPHrine (EPIPEN) 0.3 mg/0.3 mL DEVI Inject 0.3 mg into the muscle once as needed (anaphylaxis).     [provider]  esomeprazole (NEXIUM) 40 MG capsule Take 40 mg by mouth daily at 12 noon.    [provider]  ezetimibe (ZETIA) 10 MG tablet Take 1 tablet (10 mg total) by mouth at bedtime. 10/21/10   Parrett, Fonnie Mu, NP  ferrous sulfate 325 (65 FE) MG EC tablet Take 1 tablet (325 mg total) by mouth daily with breakfast. 01/08/16   Tonia Ghent, MD  fexofenadine (ALLEGRA) 180 MG tablet Take 180 mg by mouth  daily.    [provider]  FLUoxetine (PROZAC) 10 MG capsule TAKE 3 CAPSULES IN THE MORNING AND TAKE 3 CAPSULES IN THE EVENING 07/09/16   Tonia Ghent, MD  fluticasone [facility]) 50 MCG/ACT nasal spray USE 2 SPRAYS INTO THE NOSE EVERY 12 HOURS AS NEEDED FOR STUFFY NOSE 08/25/16   Tonia Ghent, MD  furosemide (LASIX) 20 MG tablet Take 1 tablet (20 mg total) by mouth daily as needed. Patient taking differently: Take 20 mg by mouth daily as needed for fluid.  12/29/15 12/28/16  Minna Merritts, MD  ibuprofen (ADVIL,MOTRIN) 200 MG tablet Take 200-400 mg by mouth every  6 (six) hours as needed for moderate pain.     [provider]  LORazepam (ATIVAN) 0.5 MG tablet TAKE 1 TABLET BY MOUTH TWICE DAILY AS NEEDED. DNF 01/26/16 07/11/16   Tonia Ghent, MD  metFORMIN (GLUCOPHAGE) 500 MG tablet Take 1 tablet (500 mg total) by mouth daily with supper. 01/06/16   Philemon Kingdom, MD  nitroGLYCERIN (NITROSTAT) 0.4 MG SL tablet Place 1 tablet (0.4 mg total) under the tongue every 5 (five) minutes as needed for chest pain. 12/09/14   Minna Merritts, MD  potassium chloride (K-DUR) 10 MEQ tablet Take 1 tablet (10 mEq total) by mouth daily as needed. Patient taking differently: Take 10 mEq by mouth daily as needed (Takes with furosemide doses.).  12/29/15 12/28/16  Minna Merritts, MD  PROAIR HFA 108 [phone] Base) MCG/ACT inhaler INHALE 2 PUFFS INTO THE LUNGS EVERY 6 HOURS AS NEEDED FOR WHEEZING OR SHORTNESS OF BREATH 09/02/16   Tonia Ghent, MD  rosuvastatin (CRESTOR) 10 MG tablet Take 1 tablet (10 mg total) by mouth every other day. 11/14/15   Tonia Ghent, MD  SYMBICORT 160-4.5 MCG/ACT inhaler INHALE 2 PUFFS FIRST THING IN THE MORNING AND 2 PUFFS AGAIN IN THE EVENING ABOUT 12 HOURS LATER 07/05/16   Tonia Ghent, MD  SYNTHROID 125 MCG tablet TAKE 1 TABLET (125 MCG TOTAL) BY MOUTH DAILY BEFORE BREAKFAST. 07/05/16   Philemon Kingdom, MD  umeclidinium bromide (INCRUSE ELLIPTA) 62.5  MCG/INH AEPB Inhale 1 puff into the lungs daily.    [provider]  valsartan (DIOVAN) 160 MG tablet Take 1 tablet (160 mg total) by mouth 2 (two) times daily. 09/16/16   Tonia Ghent, MD  vitamin B-12 (CYANOCOBALAMIN) 50 MCG tablet Take 50 mcg by mouth daily.     [provider]    Family History Family History  Problem Relation Age of Onset  . Heart disease Mother   . Thyroid disease Mother   . Emphysema Father        One lung  . Breast cancer Sister   . Cystic fibrosis Sister   . Hyperthyroidism Sister   . Osteoarthritis Brother   . Hyperthyroidism Brother   . Esophageal cancer Neg Hx   . Cancer Neg Hx        Head or neck  . Colon cancer Neg Hx   . Stomach cancer Neg Hx     Social History Social History  Substance Use Topics  . Smoking status: Former Smoker    Packs/day: 2.00    Years: 50.00    Types: Cigarettes    Quit date: 02/05/2005  . Smokeless tobacco: Never Used  . Alcohol use 1.2 oz/week    2 Standard drinks or equivalent per week     Comment: occasional     Allergies   Antihistamines, diphenhydramine-type; Sulfonamide derivatives; Incruse ellipta [umeclidinium bromide]; Clarithromycin; Codeine; and Spiriva handihaler [tiotropium bromide monohydrate]   Review of Systems Review of Systems  All other systems reviewed and are negative.    Physical Exam Updated Vital Signs BP (!) 180/64 (BP Location: Left Arm)   Pulse 85   Temp 98.1 F (36.7 C) (Oral)   Resp 19   SpO2 100%   Physical Exam  Nursing note and vitals reviewed.  81 year old female, resting comfortably and in no acute distress. Vital signs are significant for hypertension. Oxygen saturation is 100%, which is normal. Head is normocephalic. Stellate laceration present lateral to the right eye. PERRLA, EOMI.  Oropharynx is clear. Neck is nontender without adenopathy or JVD. Back is nontender and there is no CVA tenderness. Lungs are clear without rales, wheezes, or  rhonchi. Chest is mildly tender over the right lateral rib cage. There is no crepitus. Heart has regular rate and rhythm without murmur. Abdomen is soft, flat, nontender without masses or hepatosplenomegaly and peristalsis is normoactive. Extremities: Wall laceration across the anterior aspect of the right knee, oriented transversely. No swelling or deformity of the knee. 1+ edema and mild erythema and warmth of the right lower leg consistent with stasis dermatitis. No other extremity injuries seen. Skin is warm and dry without rash. Neurologic: Mental status is normal, cranial nerves are intact, there are no motor or sensory deficits.  ED Treatments / Results   Radiology Dg Ribs Unilateral W/chest Right  Result Date: 10/01/2016 CLINICAL DATA:  Trip and fall injury. Right rib pain. History of diabetes. Former smoker. History of hypertension. EXAM: RIGHT RIBS AND CHEST - 3+ VIEW COMPARISON:  01/12/2016 FINDINGS: Normal heart size and pulmonary vascularity. Nodular opacity in the left lower lung measuring 11 mm diameter, similar to previous study. Nodule previously identified in the right upper lung is not visualized today. No consolidation or airspace disease in the lungs. No blunting of costophrenic angles. No pneumothorax. Mediastinal contours appear intact. Calcification of the aorta. Postoperative changes in the right upper quadrant. Right ribs appear intact. No acute displaced fractures or focal bone lesions identified. IMPRESSION: No evidence of active pulmonary disease. Unchanged left lower lobe nodule. Negative right ribs. Electronically Signed   By: Lucienne Capers M.D.   On: 10/01/2016 02:36   Ct Head Wo Contrast  Result Date: 10/01/2016 CLINICAL DATA:  81 y/o F; status post fall with laceration above the right eye. EXAM: CT HEAD WITHOUT CONTRAST CT MAXILLOFACIAL WITHOUT CONTRAST CT CERVICAL SPINE WITHOUT CONTRAST TECHNIQUE: Multidetector CT imaging of the head, cervical spine, and  maxillofacial structures were performed using the standard protocol without intravenous contrast. Multiplanar CT image reconstructions of the cervical spine and maxillofacial structures were also generated. COMPARISON:  11/30/2007 CT head FINDINGS: CT HEAD FINDINGS Brain: No evidence of acute infarction, hemorrhage, hydrocephalus, extra-axial collection or mass lesion/mass effect. Small lucency within the left lentiform nucleus and right frontal periventricular white matter compatible with chronic lacunar infarctions. Mild brain parenchymal volume loss. Vascular: Extensive calcific atherosclerosis of carotid siphons. Skull: Normal. Negative for fracture or focal lesion. Mild left frontal scalp soft tissue thickening above the left orbit compatible with contusion. Other: None. CT MAXILLOFACIAL FINDINGS Osseous: No fracture or mandibular dislocation. No destructive process. Orbits: Negative. No traumatic or inflammatory finding. Sinuses: Clear. Soft tissues: Negative. CT CERVICAL SPINE FINDINGS Alignment: Straightening of cervical lordosis with mild reversal at the C5-6 level. No listhesis. Skull base and vertebrae: No acute fracture. No primary bone lesion or focal pathologic process. Soft tissues and spinal canal: The calcific atherosclerosis of the carotid bifurcations. Disc levels: Moderate cervical spondylosis greatest at the C5 through C7 levels where there is disc space narrowing and marginal osteophytes. Prominent upper cervical facet arthropathy. No high-grade bony canal stenosis. Upper chest: Nodular scarring in lung apices. Other: Negative. IMPRESSION: 1. Small left frontal scalp contusion. 2. No acute intracranial abnormality or displaced calvarial fracture. 3. Chronic microvascular ischemic changes and parenchymal volume loss of the brain. 4. No acute facial fracture or mandibular dislocation. 5. No acute fracture or dislocation of cervical spine. 6. Moderate cervical spondylosis greatest at the C5  through C7 levels. Electronically Signed  By: Kristine Garbe M.D.   On: 10/01/2016 02:57   Ct Cervical Spine Wo Contrast  Result Date: 10/01/2016 CLINICAL DATA:  81 y/o F; status post fall with laceration above the right eye. EXAM: CT HEAD WITHOUT CONTRAST CT MAXILLOFACIAL WITHOUT CONTRAST CT CERVICAL SPINE WITHOUT CONTRAST TECHNIQUE: Multidetector CT imaging of the head, cervical spine, and maxillofacial structures were performed using the standard protocol without intravenous contrast. Multiplanar CT image reconstructions of the cervical spine and maxillofacial structures were also generated. COMPARISON:  11/30/2007 CT head FINDINGS: CT HEAD FINDINGS Brain: No evidence of acute infarction, hemorrhage, hydrocephalus, extra-axial collection or mass lesion/mass effect. Small lucency within the left lentiform nucleus and right frontal periventricular white matter compatible with chronic lacunar infarctions. Mild brain parenchymal volume loss. Vascular: Extensive calcific atherosclerosis of carotid siphons. Skull: Normal. Negative for fracture or focal lesion. Mild left frontal scalp soft tissue thickening above the left orbit compatible with contusion. Other: None. CT MAXILLOFACIAL FINDINGS Osseous: No fracture or mandibular dislocation. No destructive process. Orbits: Negative. No traumatic or inflammatory finding. Sinuses: Clear. Soft tissues: Negative. CT CERVICAL SPINE FINDINGS Alignment: Straightening of cervical lordosis with mild reversal at the C5-6 level. No listhesis. Skull base and vertebrae: No acute fracture. No primary bone lesion or focal pathologic process. Soft tissues and spinal canal: The calcific atherosclerosis of the carotid bifurcations. Disc levels: Moderate cervical spondylosis greatest at the C5 through C7 levels where there is disc space narrowing and marginal osteophytes. Prominent upper cervical facet arthropathy. No high-grade bony canal stenosis. Upper chest: Nodular  scarring in lung apices. Other: Negative. IMPRESSION: 1. Small left frontal scalp contusion. 2. No acute intracranial abnormality or displaced calvarial fracture. 3. Chronic microvascular ischemic changes and parenchymal volume loss of the brain. 4. No acute facial fracture or mandibular dislocation. 5. No acute fracture or dislocation of cervical spine. 6. Moderate cervical spondylosis greatest at the C5 through C7 levels. Electronically Signed   By: Kristine Garbe M.D.   On: 10/01/2016 02:57   Dg Knee Complete 4 Views Right  Result Date: 10/01/2016 CLINICAL DATA:  Trip and fall injury. Right-sided rib pain. Laceration to the anterior right knee. EXAM: RIGHT KNEE - COMPLETE 4+ VIEW COMPARISON:  None. FINDINGS: Large soft tissue defect across the anterior right knee consistent history of laceration. Soft tissue emphysema extending down the right lower leg. No radiopaque soft tissue foreign bodies. Degenerative changes throughout the right knee with medial and lateral compartment narrowing and tricompartment osteophyte formation. Small hemarthrosis of the right knee. No definite fracture identified. IMPRESSION: Large soft tissue laceration with extensive subcutaneous emphysema. Moderate hemarthrosis. No displaced acute fractures are identified. Tricompartment degenerative changes. Electronically Signed   By: Lucienne Capers M.D.   On: 10/01/2016 02:33   Ct Maxillofacial Wo Contrast  Result Date: 10/01/2016 CLINICAL DATA:  81 y/o F; status post fall with laceration above the right eye. EXAM: CT HEAD WITHOUT CONTRAST CT MAXILLOFACIAL WITHOUT CONTRAST CT CERVICAL SPINE WITHOUT CONTRAST TECHNIQUE: Multidetector CT imaging of the head, cervical spine, and maxillofacial structures were performed using the standard protocol without intravenous contrast. Multiplanar CT image reconstructions of the cervical spine and maxillofacial structures were also generated. COMPARISON:  11/30/2007 CT head FINDINGS: CT  HEAD FINDINGS Brain: No evidence of acute infarction, hemorrhage, hydrocephalus, extra-axial collection or mass lesion/mass effect. Small lucency within the left lentiform nucleus and right frontal periventricular white matter compatible with chronic lacunar infarctions. Mild brain parenchymal volume loss. Vascular: Extensive calcific atherosclerosis of carotid siphons. Skull: Normal. Negative  for fracture or focal lesion. Mild left frontal scalp soft tissue thickening above the left orbit compatible with contusion. Other: None. CT MAXILLOFACIAL FINDINGS Osseous: No fracture or mandibular dislocation. No destructive process. Orbits: Negative. No traumatic or inflammatory finding. Sinuses: Clear. Soft tissues: Negative. CT CERVICAL SPINE FINDINGS Alignment: Straightening of cervical lordosis with mild reversal at the C5-6 level. No listhesis. Skull base and vertebrae: No acute fracture. No primary bone lesion or focal pathologic process. Soft tissues and spinal canal: The calcific atherosclerosis of the carotid bifurcations. Disc levels: Moderate cervical spondylosis greatest at the C5 through C7 levels where there is disc space narrowing and marginal osteophytes. Prominent upper cervical facet arthropathy. No high-grade bony canal stenosis. Upper chest: Nodular scarring in lung apices. Other: Negative. IMPRESSION: 1. Small left frontal scalp contusion. 2. No acute intracranial abnormality or displaced calvarial fracture. 3. Chronic microvascular ischemic changes and parenchymal volume loss of the brain. 4. No acute facial fracture or mandibular dislocation. 5. No acute fracture or dislocation of cervical spine. 6. Moderate cervical spondylosis greatest at the C5 through C7 levels. Electronically Signed   By: Kristine Garbe M.D.   On: 10/01/2016 02:57    Procedures Procedures (including critical care time)  LACERATION REPAIR Performed by: [email or web address],[email or web address] Authorized by: ZJIRC,VELFY Consent: Verbal  consent obtained. Risks and benefits: risks, benefits and alternatives were discussed Consent given by: patient Patient identity confirmed: provided demographic data Prepped and Draped in normal sterile fashion Wound explored  Laceration Location: left knee  Laceration Length: 23 cm  No Foreign Bodies seen or palpated  Anesthesia: local infiltration  Local anesthetic: lidocaine 1% without epinephrine  Anesthetic total: 10 ml  Amount of cleaning: standard  Skin closure: close  Number of staples: 46  Technique: surgical stapling  Patient tolerance: Patient tolerated the procedure well with no immediate complications.  LACERATION REPAIR Performed by: BOFBP,ZWCHE Authorized by: NIDPO,EUMPN Consent: Verbal consent obtained. Risks and benefits: risks, benefits and alternatives were discussed Consent given by: patient Patient identity confirmed: provided demographic data Prepped and Draped in normal sterile fashion Wound explored  Laceration Location: face  Laceration Length: 5 cm, stellate  No Foreign Bodies seen or palpated  Anesthesia: local infiltration  Local anesthetic: lidocaine 1% without epinephrine  Anesthetic total: 3 ml  Amount of cleaning: standard  Skin closure: close  Number of sutures: 10   Technique: Plastic closure of stellate flap, remainder of sutures simple interrupted with 5-0 prolene  Patient tolerance: Patient tolerated the procedure well with no immediate complications.  Medications Ordered in ED Medications  cephALEXin (KEFLEX) capsule 1,000 mg (not administered)  oxyCODONE-acetaminophen (PERCOCET/ROXICET) 5-325 MG per tablet 1 tablet (not administered)  morphine 2 MG/ML injection 4 mg (4 mg Intravenous Given 10/01/16 0200)  Tdap (BOOSTRIX) injection 0.5 mL (0.5 mLs Intramuscular Given 10/01/16 0157)  lidocaine (PF) (XYLOCAINE) 1 % injection 20 mL (20 mLs Infiltration Given by Other 10/01/16 0310)  morphine 2 MG/ML injection 4 mg (4  mg Intravenous Given 10/01/16 0342)     Initial Impression / Assessment and Plan / ED Course  I have reviewed the triage vital signs and the nursing notes.  Pertinent labs & imaging results that were available during my care of the patient were reviewed by me and considered in my medical decision making (see chart for details).  Fall with lacerations of her face and right knee. She will be sent for CT scans of head, maxillofacial, cervical spine. Will also get x-rays of right knee and right ribs. Old records  are reviewed, and she had DT booster in 2012. She will be given Tdap today.  X-rays show no fracture or intracranial injury. Knee laceration is closed with staples, facial laceration is closed with sutures. She is given prescription for oxycodone have acetaminophen for pain, and also a 5 day course of cephalexin for antibiotic prophylaxis. Advised of facial sutures removed in 5 days, staples removed in 2 weeks.  Final Clinical Impressions(s) / ED Diagnoses   Final diagnoses:  Fall at home, initial encounter  Laceration of face, initial encounter  Laceration of knee, right, initial encounter    New Prescriptions New Prescriptions   CEPHALEXIN (KEFLEX) 500 MG CAPSULE    Take 1 capsule (500 mg total) by mouth 3 (three) times daily.   OXYCODONE-ACETAMINOPHEN (PERCOCET) 5-325 MG TABLET    Take 1 tablet by mouth every 4 (four) hours as needed for moderate pain.     Delora Fuel, MD 51/46/04 [phone]

## 2016-10-01 NOTE — ED Notes (Signed)
In Ct  

## 2016-10-01 NOTE — Discharge Instructions (Signed)
Stitches in your face need to be removed in five days, staples need to be removed in 14 days. This can be done at your doctor's office, an urgent care center, or in the Emergency Department.

## 2016-10-01 NOTE — ED Triage Notes (Signed)
Pt BIB EMS from home for a fall. Patient tripped going down two steps into another room and hit her head and knee. Negative LOC. Deep lac noted to right knee and lac noted above right eye. No blood thinners, bleeding controlled. Patient given 75 mcg fentanyl and an albuterol treatment en route. Hx COPD. 2L [place] at home.

## 2016-10-04 ENCOUNTER — Telehealth: Payer: Self-pay | Admitting: *Deleted

## 2016-10-04 NOTE — Telephone Encounter (Signed)
Patient's daughter Lavella Lemons (ib [place]) left a voicemail stating that her mom fell Thursday and had staples in her knee and eye. Lavella Lemons stated that they need Dr. Josefine Class help in getting her mom into [facility] and wants to try and get that done today. Lavella Lemons stated that her mom is not able to take care of herself at home and does not have the facility to do so. Lavella Lemons is hoping to get her mom into [facility] with your help. Patient has an appointment Wednesday to get staples removed by you.

## 2016-10-04 NOTE — Telephone Encounter (Signed)
Form done, please scan a copy of the FL2.  Please print med and allergy list and put that with it.  I wish her the best.  MDs at facility should be assuming her care from [place] in the meantime, while in the facility.  Thanks.

## 2016-10-04 NOTE — Telephone Encounter (Signed)
I'll work on the [employer] as soon as I can.  I can't do much other than fill out the [facility]- the patient/family have to have make arrangements for admission to a facility when the patient hasn't been admitted to the hospital.  Please route this back to me after notifying family.  Thanks.

## 2016-10-04 NOTE — Telephone Encounter (Signed)
The family has made the arrangements with [facility], they just need the FL2 form and ask if they could possibly get it by the end of the day today.

## 2016-10-04 NOTE — Telephone Encounter (Signed)
Form copied for scanning.  Meds and allergy list attached with form.  Daughter advised that form is ready for pickup.  Daughter advised.  Daughter asked if the form could be faxed to her in [place] and if she finds out that the form has to physically be presented to [facility], she will make arrangements for pickup.  Form and meds and allergy list faxed to daughter.  [phone]

## 2016-10-06 ENCOUNTER — Ambulatory Visit (INDEPENDENT_AMBULATORY_CARE_PROVIDER_SITE_OTHER): Payer: BLUE CROSS/BLUE SHIELD | Admitting: Family Medicine

## 2016-10-06 ENCOUNTER — Encounter: Payer: Self-pay | Admitting: Family Medicine

## 2016-10-06 ENCOUNTER — Telehealth: Payer: Self-pay

## 2016-10-06 VITALS — BP 126/60 | HR 75 | Temp 97.9°F | Ht 65.0 in

## 2016-10-06 DIAGNOSIS — M25561 Pain in right knee: Secondary | ICD-10-CM | POA: Diagnosis not present

## 2016-10-06 DIAGNOSIS — S01111D Laceration without foreign body of right eyelid and periocular area, subsequent encounter: Secondary | ICD-10-CM

## 2016-10-06 DIAGNOSIS — M25569 Pain in unspecified knee: Secondary | ICD-10-CM | POA: Insufficient documentation

## 2016-10-06 DIAGNOSIS — S81011D Laceration without foreign body, right knee, subsequent encounter: Secondary | ICD-10-CM | POA: Diagnosis not present

## 2016-10-06 MED ORDER — GABAPENTIN 100 MG PO CAPS: 100.0000 mg | ORAL_CAPSULE | Freq: Two times a day (BID) | ORAL | 1 refills | Status: DC | PRN

## 2016-10-06 NOTE — Assessment & Plan Note (Signed)
Large R knee laceration with staples in place - not yet ready to be removed. [facility] will be able to remove these around 10/14/2016. No signs of infection at this time - finishing 5d keflex course.

## 2016-10-06 NOTE — Patient Instructions (Addendum)
Trial gabapentin 100mg  twice daily for the next 2-3 days If tolerating well, may increase to 200mg  twice daily and update [place] with effect.  Take tylenol 500mg  with meals as well for pain.  May use up oxycodones - 1/2 tablet at a time twice daily as neededfor breakthrough pain.  10 sutures removed today from R eyebrow. You will need 46 staples at knee removed around 10/14/2016.

## 2016-10-06 NOTE — Assessment & Plan Note (Signed)
Acute knee pain after fall with laceration, xrays at ER without fracture. Discussed options - tramadol vs gabapentin vs lyrica. Pt and daughter think that predominant pain component coming from neuropathy and would like trial of gabapentin. Rec stop oxycodone (too strong). Will start gabapentin 100mg  BID with slow taper as needed, monitor for dizziness at higher doses. Pt and daughter agree with plan.

## 2016-10-06 NOTE — Assessment & Plan Note (Signed)
Sutures removed, pt tolerated well. Steri strips applied and after care reviewed.

## 2016-10-06 NOTE — Telephone Encounter (Signed)
Alexandria [name] at [place] left v/m requesting cb about directions for gabapentin. Alexandria [name] needs to know how many days pt is to take 100 mg bid.Please advise.

## 2016-10-06 NOTE — Progress Notes (Signed)
BP 126/60 (BP Location: Right Arm)   Pulse 75   Temp 97.9 F (36.6 C) (Oral)   Ht 5\' 5"  (1.651 m)   SpO2 94%    CC: staple removal, FL-2 change Subjective:    Patient ID: [name], female    DOB: 1935-10-31, 81 y.o.   MRN: [id number]  HPI: WILHEMINA GRALL is a 81 y.o. female presenting on 10/06/2016 for Suture / Staple Removal (suture removal on her upper right eye brow )   Patient of Dr Josefine Class.  Here with daughter and caregiver today.   Fall Thursday last week s/p eval at ER (note reviewed) stumbled and couldn't catch herself when letting her dog in at night time - fell and injured R forehead, R chest wall, R knee - had 10 sutures placed above R eyebrow for laceration, 46 staples to knee for large laceration. Head CT and C spine and maxillofacial CT non acute. R knee xray without acute fracture but showed large soft tissue laceration with SQ emphysema and moderate hemarthrosis. Treated with oxycodone for pain as well as 5d keflex course.   Td 2012. Tdap 09/2016.  Not on any blood thinners.   Here for eyebrow suture removal. rec staple removal in 2 wks.   Known chronic neuropathy pain, acutely worse recently.  She started lyrica 200mg  daily (old Rx she had from 2012) due to ongoing pain.  Requests pain medication - pt and family feel oxycodone was too strong, hesitant for strong narcotic.  In process of getting into [facility] - needs FL-2 form updated. Planning on admission today.   Relevant past medical, surgical, family and social history reviewed and updated as indicated. Interim medical history since our last visit reviewed. Allergies and medications reviewed and updated. Outpatient Medications Prior to Visit  Medication Sig Dispense Refill  . cephALEXin (KEFLEX) 500 MG capsule Take 1 capsule (500 mg total) by mouth 3 (three) times daily. 15 capsule 0  . Coenzyme Q-10 200 MG CAPS Take 200 mg by mouth daily.     Marland Kitchen EPINEPHrine (EPIPEN) 0.3 mg/0.3 mL DEVI Inject 0.3  mg into the muscle once as needed (anaphylaxis).     Marland Kitchen esomeprazole (NEXIUM) 40 MG capsule Take 40 mg by mouth daily at 12 noon.    . ezetimibe (ZETIA) 10 MG tablet Take 1 tablet (10 mg total) by mouth at bedtime.    . ferrous sulfate 325 (65 FE) MG EC tablet Take 1 tablet (325 mg total) by mouth daily with breakfast.    . fexofenadine (ALLEGRA) 180 MG tablet Take 180 mg by mouth daily.    Marland Kitchen FLUoxetine (PROZAC) 10 MG capsule TAKE 3 CAPSULES IN THE MORNING AND TAKE 3 CAPSULES IN THE EVENING 540 capsule 3  . fluticasone (FLONASE) 50 MCG/ACT nasal spray USE 2 SPRAYS INTO THE NOSE EVERY 12 HOURS AS NEEDED FOR STUFFY NOSE 48 g 2  . furosemide (LASIX) 20 MG tablet Take 1 tablet (20 mg total) by mouth daily as needed. (Patient taking differently: Take 20 mg by mouth daily as needed for fluid. ) 90 tablet 3  . ibuprofen (ADVIL,MOTRIN) 200 MG tablet Take 200-400 mg by mouth every 6 (six) hours as needed for moderate pain.     Marland Kitchen LORazepam (ATIVAN) 0.5 MG tablet TAKE 1 TABLET BY MOUTH TWICE DAILY AS NEEDED. DNF 01/26/16 180 tablet 0  . metFORMIN (GLUCOPHAGE) 500 MG tablet Take 1 tablet (500 mg total) by mouth daily with supper. 90 tablet 3  .  nitroGLYCERIN (NITROSTAT) 0.4 MG SL tablet Place 1 tablet (0.4 mg total) under the tongue every 5 (five) minutes as needed for chest pain. 25 tablet 3  . oxyCODONE-acetaminophen (PERCOCET) 5-325 MG tablet Take 1 tablet by mouth every 4 (four) hours as needed for moderate pain. 20 tablet 0  . potassium chloride (K-DUR) 10 MEQ tablet Take 1 tablet (10 mEq total) by mouth daily as needed. (Patient taking differently: Take 10 mEq by mouth daily as needed (Takes with furosemide doses.). ) 90 tablet 3  . PROAIR HFA 108 (90 Base) MCG/ACT inhaler INHALE 2 PUFFS INTO THE LUNGS EVERY 6 HOURS AS NEEDED FOR WHEEZING OR SHORTNESS OF BREATH 8.5 Inhaler 2  . rosuvastatin (CRESTOR) 10 MG tablet Take 1 tablet (10 mg total) by mouth every other day. 90 tablet 1  . SYMBICORT 160-4.5 MCG/ACT  inhaler INHALE 2 PUFFS FIRST THING IN THE MORNING AND 2 PUFFS AGAIN IN THE EVENING ABOUT 12 HOURS LATER 30.6 Inhaler 2  . SYNTHROID 125 MCG tablet TAKE 1 TABLET (125 MCG TOTAL) BY MOUTH DAILY BEFORE BREAKFAST. 90 tablet 1  . umeclidinium bromide (INCRUSE ELLIPTA) 62.5 MCG/INH AEPB Inhale 1 puff into the lungs daily.    . valsartan (DIOVAN) 160 MG tablet Take 1 tablet (160 mg total) by mouth 2 (two) times daily. 180 tablet 3  . vitamin B-12 (CYANOCOBALAMIN) 50 MCG tablet Take 50 mcg by mouth daily.      No facility-administered medications prior to visit.      Per HPI unless specifically indicated in ROS section below Review of Systems     Objective:    BP 126/60 (BP Location: Right Arm)   Pulse 75   Temp 97.9 F (36.6 C) (Oral)   Ht 5\' 5"  (1.651 m)   SpO2 94%   Wt Readings from Last 3 Encounters:  09/14/16 241 lb 12 oz (109.7 kg)  08/17/16 241 lb (109.3 kg)  08/17/16 241 lb (109.3 kg)    Physical Exam  Constitutional: She appears well-developed and well-nourished. No distress.  In wheelchair with L leg binder in place  HENT:  Head: Head is with laceration.  Mouth/Throat: Oropharynx is clear and moist. No oropharyngeal exudate.  R laceration lateral to eyebrow with skin well approximated, with perioral bruising/ecchymosis.   Musculoskeletal: She exhibits edema.  R pedal swelling/edema with mild erythema after trauma Large transverse laceration across R anterior knee  Dressings c/d/i  Skin: Skin is warm and dry. No rash noted.  Psychiatric: She has a normal mood and affect.  Nursing note and vitals reviewed.  Suture removal: 10 fine sutures removed from R eyebrow lac, pt tolerated well.  Steri strips applied afterwards, after care reviewed with pt and daughter.     Assessment & Plan:   Problem List Items Addressed This Visit    Eyebrow laceration, right, subsequent encounter - Primary    Sutures removed, pt tolerated well. Steri strips applied and after care reviewed.         Knee laceration, right, subsequent encounter    Large R knee laceration with staples in place - not yet ready to be removed. [facility] will be able to remove these around 10/14/2016. No signs of infection at this time - finishing 5d keflex course.       Knee pain, acute    Acute knee pain after fall with laceration, xrays at ER without fracture. Discussed options - tramadol vs gabapentin vs lyrica. Pt and daughter think that predominant pain component  coming from neuropathy and would like trial of gabapentin. Rec stop oxycodone (too strong). Will start gabapentin 100mg  BID with slow taper as needed, monitor for dizziness at higher doses. Pt and daughter agree with plan.           Follow up plan: No Follow-up on file.  Ria Bush, MD

## 2016-10-06 NOTE — Telephone Encounter (Signed)
Recommend take 100mg  bid for 2 days then may increase to 200mg  bid Spoke w pharmacy

## 2016-10-07 ENCOUNTER — Telehealth: Payer: Self-pay | Admitting: *Deleted

## 2016-10-07 DIAGNOSIS — I509 Heart failure, unspecified: Secondary | ICD-10-CM | POA: Diagnosis not present

## 2016-10-07 DIAGNOSIS — I10 Essential (primary) hypertension: Secondary | ICD-10-CM | POA: Diagnosis not present

## 2016-10-07 DIAGNOSIS — J449 Chronic obstructive pulmonary disease, unspecified: Secondary | ICD-10-CM | POA: Diagnosis not present

## 2016-10-07 DIAGNOSIS — R062 Wheezing: Secondary | ICD-10-CM | POA: Diagnosis not present

## 2016-10-07 NOTE — Telephone Encounter (Signed)
Up to date meds list faxed to [facility].

## 2016-10-07 NOTE — Telephone Encounter (Signed)
Spoke to pts daughter who states FL2 form was completed for her to be admitted to [facility]. Dr g prescribed new meds on 10/06/16 that were not on the original request. She is requesting [facility] be [facility] so that the new medications can be added and begin to be administered asap. They have not administered any of the new meds and will not do so until hearing from Dr Darnell Level or [name].

## 2016-10-07 NOTE — Telephone Encounter (Signed)
Please send current list of meds.  Thanks.

## 2016-10-08 ENCOUNTER — Other Ambulatory Visit: Payer: Self-pay | Admitting: Family Medicine

## 2016-10-08 DIAGNOSIS — R062 Wheezing: Secondary | ICD-10-CM | POA: Diagnosis not present

## 2016-10-08 MED ORDER — LORAZEPAM 0.5 MG PO TABS: ORAL_TABLET | ORAL | Status: DC

## 2016-10-08 MED ORDER — OXYCODONE-ACETAMINOPHEN 5-325 MG PO TABS: 0.5000 | ORAL_TABLET | ORAL | Status: DC | PRN

## 2016-10-08 NOTE — Progress Notes (Signed)
Med list updated. Thanks.

## 2016-10-11 NOTE — Telephone Encounter (Signed)
Lorazepam sig already adjusted.   The orders need to start coming from the MD at facility.

## 2016-10-11 NOTE — Telephone Encounter (Signed)
[name] advised.

## 2016-10-11 NOTE — Telephone Encounter (Signed)
Alexandria [name] (DPR signed) left v/m requesting orders for gabapentin be sent to [facility]. Alexandria [name] also request the lorazepam instructions be changed from bid prn to take one tab bid. [place] request cb.

## 2016-10-13 NOTE — Telephone Encounter (Signed)
pts daughter said that [facility] has not gotten pts med list and request it to be refaxed to [employer] atten: Nira Conn. I called Isaias Cowman [phone] and spoke with Elmo Putt in med records and she said to fax to [phone] atten; [place]. Done.

## 2016-10-14 ENCOUNTER — Ambulatory Visit: Payer: BLUE CROSS/BLUE SHIELD | Admitting: Family Medicine

## 2016-10-14 DIAGNOSIS — I10 Essential (primary) hypertension: Secondary | ICD-10-CM | POA: Diagnosis not present

## 2016-10-14 DIAGNOSIS — I509 Heart failure, unspecified: Secondary | ICD-10-CM | POA: Diagnosis not present

## 2016-10-14 DIAGNOSIS — J449 Chronic obstructive pulmonary disease, unspecified: Secondary | ICD-10-CM | POA: Diagnosis not present

## 2016-10-14 DIAGNOSIS — Z792 Long term (current) use of antibiotics: Secondary | ICD-10-CM | POA: Diagnosis not present

## 2016-10-18 ENCOUNTER — Telehealth: Payer: Self-pay

## 2016-10-18 NOTE — Telephone Encounter (Signed)
Pt's daughter called thinking her mother needs to see a wound care specialist for her knee laceration. [facility] did not remove the staples last Thursday, but they said it did not look like it was closed enough to remove the staples. She is assuming that it is because it does not look good. I told her that we could probably see her and remove the staples. She is asking what she should do. I did make her an appt for tomorrow at 1115.

## 2016-10-18 NOTE — Telephone Encounter (Signed)
Agree with eval prior to referral. Looks like scheduled with PCP tomorrow.

## 2016-10-18 NOTE — Telephone Encounter (Signed)
Will see tomorrow. Thanks.

## 2016-10-19 ENCOUNTER — Ambulatory Visit: Payer: BLUE CROSS/BLUE SHIELD | Admitting: Family Medicine

## 2016-10-19 ENCOUNTER — Encounter: Payer: Self-pay | Admitting: Family Medicine

## 2016-10-19 ENCOUNTER — Ambulatory Visit (INDEPENDENT_AMBULATORY_CARE_PROVIDER_SITE_OTHER): Payer: BLUE CROSS/BLUE SHIELD | Admitting: Family Medicine

## 2016-10-19 DIAGNOSIS — L0390 Cellulitis, unspecified: Secondary | ICD-10-CM | POA: Diagnosis not present

## 2016-10-19 NOTE — Patient Instructions (Signed)
Keep taking bactrim, have facility staff recheck leg in 2 days.  Update me as needed.  Take care.  Glad to see you.

## 2016-10-19 NOTE — Progress Notes (Signed)
Current at [facility] place with the plan to come home in the next week or so, per patient report.   Off lyrica in meantime and hasn't needed gabapentin for pain.  Neuropathy pain is controlled w/o gabapentin.    She is still on valsartan but her home med isn't in the recall group per patient report.  D/w pt.  She opted to continue as is, and that is reasonable.    Previous ER visit noted for knee laceration. Laceration closed with staples. All staples removed except for one on the lateral portion of the laceration. The staple was previously bent with removal attempt by another provider elsewhere. In the meantime she had developed cellulitis on the right lower leg and has been treated with Septra. She was previously on Keflex per prescription has been completed.  No fevers.  Meds, vitals, and allergies reviewed.   ROS: Per HPI unless specifically indicated in ROS section   nad She has Steri-Strips over the laceration near the right brow rrr ctab abd soft R leg with laceration closed with Steri-Strips. No tissue dehiscence with range of motion of the knee. She has one been stapled that is still in the laceration on the lateral side. This was carefully removed as gently as possible with staple remover and tweezers. Successfully removed. Area covered with Steri-Strips. The wound itself does not look infected but she appears to have a cellulitis that is a separate issue on the right shin.

## 2016-10-20 ENCOUNTER — Telehealth: Payer: Self-pay

## 2016-10-20 DIAGNOSIS — S81801S Unspecified open wound, right lower leg, sequela: Secondary | ICD-10-CM

## 2016-10-20 DIAGNOSIS — L0390 Cellulitis, unspecified: Secondary | ICD-10-CM | POA: Insufficient documentation

## 2016-10-20 NOTE — Assessment & Plan Note (Signed)
Continue Septra. She will have house staff check the cellulitis and wound in about 2 days at the facility. Update me as needed. Okay for follow-up as planned.   Discussed laceration and care, separate issue. She can stop using the leg immobilizer. At this point continued use would likely pose more risk than benefit. Would limit range of motion to 45 at the knee just so she does not stretch the healing laceration to much. Discussed with patient. She understood. Continue other medications. Update me as needed. Last staple was successfully removed from the laceration today.

## 2016-10-20 NOTE — Telephone Encounter (Signed)
[name] left v/m; Lavella Lemons was to cb with med pt taking; pt taking Bactrim 800 mcg and pt has 14 pills to take.If [name] suggest any changes let Lavella Lemons know. Also [name] request referral to wound care doctor for infection in leg. [place] request cb.

## 2016-10-21 NOTE — Telephone Encounter (Signed)
Left detailed message on voicemail.  

## 2016-10-21 NOTE — Telephone Encounter (Signed)
Referral ordered.  Med list is correct.  Thanks.

## 2016-10-22 ENCOUNTER — Other Ambulatory Visit: Payer: Self-pay | Admitting: Family Medicine

## 2016-10-22 DIAGNOSIS — L03115 Cellulitis of right lower limb: Secondary | ICD-10-CM | POA: Diagnosis not present

## 2016-10-22 DIAGNOSIS — I509 Heart failure, unspecified: Secondary | ICD-10-CM | POA: Diagnosis not present

## 2016-10-22 DIAGNOSIS — J449 Chronic obstructive pulmonary disease, unspecified: Secondary | ICD-10-CM | POA: Diagnosis not present

## 2016-10-22 DIAGNOSIS — Z7409 Other reduced mobility: Secondary | ICD-10-CM | POA: Diagnosis not present

## 2016-10-26 ENCOUNTER — Encounter (INDEPENDENT_AMBULATORY_CARE_PROVIDER_SITE_OTHER): Payer: Self-pay

## 2016-10-26 ENCOUNTER — Ambulatory Visit (INDEPENDENT_AMBULATORY_CARE_PROVIDER_SITE_OTHER): Payer: BLUE CROSS/BLUE SHIELD | Admitting: Pulmonary Disease

## 2016-10-26 ENCOUNTER — Other Ambulatory Visit: Payer: Self-pay | Admitting: *Deleted

## 2016-10-26 ENCOUNTER — Encounter: Payer: Self-pay | Admitting: Pulmonary Disease

## 2016-10-26 VITALS — BP 128/64 | HR 82 | Ht 65.0 in | Wt 219.0 lb

## 2016-10-26 DIAGNOSIS — J9611 Chronic respiratory failure with hypoxia: Secondary | ICD-10-CM

## 2016-10-26 DIAGNOSIS — J411 Mucopurulent chronic bronchitis: Secondary | ICD-10-CM | POA: Diagnosis not present

## 2016-10-26 DIAGNOSIS — G4733 Obstructive sleep apnea (adult) (pediatric): Secondary | ICD-10-CM | POA: Diagnosis not present

## 2016-10-26 NOTE — Patient Instructions (Signed)
Will arrange for overnight oxygen test  Follow up in 6 months 

## 2016-10-26 NOTE — Progress Notes (Signed)
Current Outpatient Prescriptions on File Prior to Visit  Medication Sig  . Coenzyme Q-10 200 MG CAPS Take 200 mg by mouth daily.   Marland Kitchen EPINEPHrine (EPIPEN) 0.3 mg/0.3 mL DEVI Inject 0.3 mg into the muscle once as needed (anaphylaxis).   Marland Kitchen esomeprazole (NEXIUM) 40 MG capsule Take 40 mg by mouth daily at 12 noon.  . ezetimibe (ZETIA) 10 MG tablet Take 1 tablet (10 mg total) by mouth at bedtime.  . ferrous sulfate 325 (65 FE) MG EC tablet Take 1 tablet (325 mg total) by mouth daily with breakfast.  . fexofenadine (ALLEGRA) 180 MG tablet Take 180 mg by mouth daily.  Marland Kitchen FLUoxetine (PROZAC) 10 MG capsule TAKE 3 CAPSULES IN THE MORNING AND TAKE 3 CAPSULES IN THE EVENING  . fluticasone (FLONASE) 50 MCG/ACT nasal spray USE 2 SPRAYS INTO THE NOSE EVERY 12 HOURS AS NEEDED FOR STUFFY NOSE  . furosemide (LASIX) 20 MG tablet Take 1 tablet (20 mg total) by mouth daily as needed. (Patient taking differently: Take 20 mg by mouth daily as needed for fluid. )  . gabapentin (NEURONTIN) 100 MG capsule Take 1-2 capsules (100-200 mg total) by mouth 2 (two) times daily as needed. First few days take 100mg  twice daily.  Marland Kitchen ibuprofen (ADVIL,MOTRIN) 200 MG tablet Take 200-400 mg by mouth every 6 (six) hours as needed for moderate pain.   Marland Kitchen LORazepam (ATIVAN) 0.5 MG tablet TAKE 1 TABLET BY MOUTH TWICE DAILY.  . metFORMIN (GLUCOPHAGE) 500 MG tablet Take 1 tablet (500 mg total) by mouth daily with supper.  . nitroGLYCERIN (NITROSTAT) 0.4 MG SL tablet Place 1 tablet (0.4 mg total) under the tongue every 5 (five) minutes as needed for chest pain.  Marland Kitchen oxyCODONE-acetaminophen (PERCOCET) 5-325 MG tablet Take 0.5 tablets by mouth every 4 (four) hours as needed for moderate pain.  . potassium chloride (K-DUR) 10 MEQ tablet Take 1 tablet (10 mEq total) by mouth daily as needed. (Patient taking differently: Take 10 mEq by mouth daily as needed (Takes with furosemide doses.). )  . PROAIR HFA 108 (90 Base) MCG/ACT inhaler INHALE 2 PUFFS INTO  THE LUNGS EVERY 6 HOURS AS NEEDED FOR WHEEZING OR SHORTNESS OF BREATH  . rosuvastatin (CRESTOR) 10 MG tablet TAKE 1 TABLET (10 MG TOTAL) BY MOUTH EVERY OTHER DAY.  . SYMBICORT 160-4.5 MCG/ACT inhaler INHALE 2 PUFFS FIRST THING IN THE MORNING AND 2 PUFFS AGAIN IN THE EVENING ABOUT 12 HOURS LATER  . SYNTHROID 125 MCG tablet TAKE 1 TABLET (125 MCG TOTAL) BY MOUTH DAILY BEFORE BREAKFAST.  Marland Kitchen umeclidinium bromide (INCRUSE ELLIPTA) 62.5 MCG/INH AEPB Inhale 1 puff into the lungs daily.  . valsartan (DIOVAN) 160 MG tablet Take 1 tablet (160 mg total) by mouth 2 (two) times daily.  . vitamin B-12 (CYANOCOBALAMIN) 50 MCG tablet Take 50 mcg by mouth daily.   Marland Kitchen sulfamethoxazole-trimethoprim (BACTRIM DS,SEPTRA DS) 800-160 MG tablet Take 1 tablet by mouth 2 (two) times daily.   No current facility-administered medications on file prior to visit.     Chief Complaint  Patient presents with  . Follow-up    Pt has not worn CPAP since 06/2016 d/t intolerance to mask. Denies problems with pressure that she recalls. Pt does not like the current mask. DME: [facility]; Pt needs to discuss O2 use. Pt not using nightly as directed.     Sleep tests PSG 01/27/10 >> AHI 13, SpO2 low 73% ONO with CPAP 08/09/14 >> Test time 9 hr 16 min. Mean SpO2 92.2%, low SpO2  86%. Spent 10 min with SpO2 < 88% ONO with CPAP 06/24/16 >> test time 7 hrs 29 min.  Average SpO2 90%, low SpO2 79%.  Spent 58 min with SpO2 < 88%.  Pulmonary tests RAST 10/02/10 >> negative, IgE 8.1 A1AT 04/15/11 >> MM PFT 10/512 >> FEV1 1.29 (61%), FEV1% 57, TLC 6.01 (119%), RV 3.52 (153%), DLCO 67%, no BD PFT 08/15/14 >> FEV1 1.07 (57%), FEV1% 55, TLC 6.49 (128%), DLCO 47%, no BD CT chest 06/29/16 >> atherosclerosis, RUL nodule 7 mm (was 8 mm), no change 6 mm LUL nodule, no change 4 mm RLL nodule  Cardiac tests Echo 09/28/14 >> mild LVH, EF 55 to 60%  Past medical history Depression, Hypothyroidism, HTN, Diastolic CHF, HLD, DM, GERD, Peripheral neuropathy  Past  surgical history, Family history, Social history, Allergies reviewed  Vital signs BP 128/64 (BP Location: Right Arm, Cuff Size: Normal)   Pulse 82   Ht 5\' 5"  (1.651 m)   Wt 219 lb (99.3 kg)   SpO2 97%   BMI 36.44 kg/m   History of Present Illness: Alexandria [name] is a 81 y.o. female with dyspnea from COPD, diastolic CHF, deconditioning, and iron deficiency anemia.  She also has hx of OSA.  She has been experiencing trouble using her CPAP mask.  This causes sore over her nose.  She feels the pressure is okay.    Her breathing is okay otherwise.  She is not having cough, wheeze, sputum, chest pain, or swelling.  She didn't feel like incruse helped.  Physical Exam:  General - pleasant Eyes - wears glasses ENT - no sinus tenderness, no oral exudate, no LAN Cardiac - regular, no murmur Chest - no wheeze, rales Abd - soft, non tender Ext - no edema Skin - no rashes Neuro - normal strength Psych - normal mood   Assessment/Plan:  COPD with emphysema. - continue symbicort and prn proair  Chronic respiratory failure with hypoxia. - 2 liters oxygen with exertion  Obstructive sleep apnea. - will need to arrange for ONO with room air and then decide optimal therapy  Lung nodule. - she will need f/u non contrast CT chest in April 2019   Patient Instructions  Will arrange for overnight oxygen test  Follow up in 6 months  Time spent 27 minutes  Chesley Mires, MD [place] [facility] Pager:  [phone] 10/26/2016, 2:54 PM

## 2016-10-27 ENCOUNTER — Ambulatory Visit: Payer: BLUE CROSS/BLUE SHIELD | Admitting: Family Medicine

## 2016-10-28 ENCOUNTER — Encounter: Payer: BLUE CROSS/BLUE SHIELD | Attending: Surgery | Admitting: Surgery

## 2016-10-28 DIAGNOSIS — Z882 Allergy status to sulfonamides status: Secondary | ICD-10-CM | POA: Diagnosis not present

## 2016-10-28 DIAGNOSIS — G4730 Sleep apnea, unspecified: Secondary | ICD-10-CM | POA: Insufficient documentation

## 2016-10-28 DIAGNOSIS — I5032 Chronic diastolic (congestive) heart failure: Secondary | ICD-10-CM | POA: Diagnosis not present

## 2016-10-28 DIAGNOSIS — E11622 Type 2 diabetes mellitus with other skin ulcer: Secondary | ICD-10-CM | POA: Diagnosis not present

## 2016-10-28 DIAGNOSIS — S81001A Unspecified open wound, right knee, initial encounter: Secondary | ICD-10-CM | POA: Diagnosis not present

## 2016-10-28 DIAGNOSIS — Z87891 Personal history of nicotine dependence: Secondary | ICD-10-CM | POA: Insufficient documentation

## 2016-10-28 DIAGNOSIS — E1142 Type 2 diabetes mellitus with diabetic polyneuropathy: Secondary | ICD-10-CM | POA: Insufficient documentation

## 2016-10-28 DIAGNOSIS — I110 Hypertensive heart disease with heart failure: Secondary | ICD-10-CM | POA: Insufficient documentation

## 2016-10-28 DIAGNOSIS — J449 Chronic obstructive pulmonary disease, unspecified: Secondary | ICD-10-CM | POA: Diagnosis not present

## 2016-10-28 DIAGNOSIS — E039 Hypothyroidism, unspecified: Secondary | ICD-10-CM | POA: Insufficient documentation

## 2016-10-28 DIAGNOSIS — F329 Major depressive disorder, single episode, unspecified: Secondary | ICD-10-CM | POA: Insufficient documentation

## 2016-10-28 DIAGNOSIS — I890 Lymphedema, not elsewhere classified: Secondary | ICD-10-CM | POA: Diagnosis not present

## 2016-10-28 DIAGNOSIS — K219 Gastro-esophageal reflux disease without esophagitis: Secondary | ICD-10-CM | POA: Insufficient documentation

## 2016-10-28 DIAGNOSIS — X58XXXA Exposure to other specified factors, initial encounter: Secondary | ICD-10-CM | POA: Diagnosis not present

## 2016-10-28 DIAGNOSIS — E785 Hyperlipidemia, unspecified: Secondary | ICD-10-CM | POA: Diagnosis not present

## 2016-10-28 DIAGNOSIS — S81011A Laceration without foreign body, right knee, initial encounter: Secondary | ICD-10-CM | POA: Diagnosis not present

## 2016-10-29 ENCOUNTER — Telehealth: Payer: Self-pay | Admitting: Family Medicine

## 2016-10-29 ENCOUNTER — Other Ambulatory Visit: Payer: Self-pay | Admitting: Family Medicine

## 2016-10-29 NOTE — Telephone Encounter (Signed)
Agreed, thanks.  Please let me know if there is any outstanding issue for me to address with this.

## 2016-10-29 NOTE — Telephone Encounter (Signed)
Electronic refill request. Lorazepam Last office visit:   10/19/16 Last Filled:    LORazepam (ATIVAN) 0.5 MG tablet   10/08/2016    Sig: TAKE 1 TABLET BY MOUTH TWICE DAILY.   Class: No Print   Please advise.

## 2016-10-29 NOTE — Telephone Encounter (Signed)
[place] [facility]. They received orders for [facility] services. PT will go to see her 8/25, Sat to start home health.

## 2016-10-31 NOTE — Telephone Encounter (Signed)
Please call in.  Thanks.   

## 2016-10-31 NOTE — Progress Notes (Signed)
Alexandria [name] ([id number]) Visit Report for 10/28/2016 Allergy List Details Patient Name: Alexandria [name] Date of Service: 10/28/2016 12:45 PM Medical Record Number: [id number] Patient Account Number: [id number] Date of Birth/Sex: May 09, 1935 (81 y.o. Female) Treating RN: Ahmed Prima Primary Care [name]: Elsie Stain Other Clinician: Referring Nevae Pinnix: Elsie Stain Treating Marysol Wellnitz/Extender: Frann Rider in Treatment: 0 Allergies Active Allergies antihistamines diphenhydramine Sulfa (Sulfonamide Antibiotics) Incruse Ellipta clarithromycin Spiriva with HandiHaler Allergy Notes Electronic Signature(s) Signed: 10/29/2016 4:07:04 PM By: Alric Quan Entered By: Alric Quan on 10/28/2016 42:68:34 Alexandria [name] ([id number]) -------------------------------------------------------------------------------- Arrival Information Details Patient Name: Alexandria [name] Date of Service: 10/28/2016 12:45 PM Medical Record Number: [id number] Patient Account Number: [id number] Date of Birth/Sex: 30-Nov-1935 (81 y.o. Female) Treating RN: Carolyne Fiscal, Debi Primary Care Merrilyn Legler: Elsie Stain Other Clinician: Referring Jden Want: Elsie Stain Treating Rainee Sweatt/Extender: Frann Rider in Treatment: 0 Visit Information Patient Arrived: Wheel Chair Arrival Time: 12:48 Accompanied By: daugter Transfer Assistance: EasyPivot Patient Lift Patient Identification Verified: Yes Secondary Verification Process Yes Completed: Patient Requires Transmission- No Based Precautions: Patient Has Alerts: Yes Patient Alerts: DM II Electronic Signature(s) Signed: 10/29/2016 4:07:04 PM By: Alric Quan Entered By: Alric Quan on 10/28/2016 12:53:14 Alexandria [name] ([id number]) -------------------------------------------------------------------------------- Clinic Level of Care Assessment Details Patient Name: Alexandria [name] Date of Service: 10/28/2016 12:45  PM Medical Record Number: [id number] Patient Account Number: [id number] Date of Birth/Sex: 23-Dec-1935 (81 y.o. Female) Treating RN: Carolyne Fiscal, Debi Primary Care Waymon Laser: Elsie Stain Other Clinician: Referring Christerpher Clos: Elsie Stain Treating Alizee Maple/Extender: Frann Rider in Treatment: 0 Clinic Level of Care Assessment Items TOOL 2 Quantity Score X - Use when only an EandM is performed on the INITIAL visit 1 0 ASSESSMENTS - Nursing Assessment / Reassessment X - General Physical Exam (combine w/ comprehensive assessment (listed just 1 20 below) when performed on new pt. evals) X - Comprehensive Assessment (HX, ROS, Risk Assessments, Wounds Hx, etc.) 1 25 ASSESSMENTS - Wound and Skin Assessment / Reassessment X - Simple Wound Assessment / Reassessment - one wound 1 5 []  - Complex Wound Assessment / Reassessment - multiple wounds 0 []  - Dermatologic / Skin Assessment (not related to wound area) 0 ASSESSMENTS - Ostomy and/or Continence Assessment and Care []  - Incontinence Assessment and Management 0 []  - Ostomy Care Assessment and Management (repouching, etc.) 0 PROCESS - Coordination of Care []  - Simple Patient / Family Education for ongoing care 0 X - Complex (extensive) Patient / Family Education for ongoing care 1 20 X - Staff obtains Programmer, systems, Records, Test Results / Process Orders 1 10 []  - Staff telephones HHA, Nursing Homes / Clarify orders / etc 0 []  - Routine Transfer to another Facility (non-emergent condition) 0 []  - Routine Hospital Admission (non-emergent condition) 0 []  - New Admissions / Biomedical engineer / Ordering NPWT, Apligraf, etc. 0 []  - Emergency Hospital Admission (emergent condition) 0 X - Simple Discharge Coordination 1 10 Mccumbers, Seynabou E. ([id number]) []  - Complex (extensive) Discharge Coordination 0 PROCESS - Special Needs []  - Pediatric / Minor Patient Management 0 []  - Isolation Patient Management 0 []  - Hearing / Language / Visual  special needs 0 []  - Assessment of Community assistance (transportation, D/C planning, etc.) 0 []  - Additional assistance / Altered mentation 0 []  - Support Surface(s) Assessment (bed, cushion, seat, etc.) 0 INTERVENTIONS - Wound Cleansing / Measurement X - Wound Imaging (photographs - any number of wounds) 1 5 []  - Wound Tracing (instead of photographs) 0 X - Simple Wound Measurement -  one wound 1 5 []  - Complex Wound Measurement - multiple wounds 0 X - Simple Wound Cleansing - one wound 1 5 []  - Complex Wound Cleansing - multiple wounds 0 INTERVENTIONS - Wound Dressings []  - Small Wound Dressing one or multiple wounds 0 X - Medium Wound Dressing one or multiple wounds 1 15 []  - Large Wound Dressing one or multiple wounds 0 []  - Application of Medications - injection 0 INTERVENTIONS - Miscellaneous []  - External ear exam 0 []  - Specimen Collection (cultures, biopsies, blood, body fluids, etc.) 0 []  - Specimen(s) / Culture(s) sent or taken to Lab for analysis 0 []  - Patient Transfer (multiple staff / Harrel Lemon Lift / Similar devices) 0 []  - Simple Staple / Suture removal (25 or less) 0 []  - Complex Staple / Suture removal (26 or more) 0 Richeson, Kailah E. ([id number]) []  - Hypo / Hyperglycemic Management ([name] monitor of Blood Glucose) 0 X - Ankle / Brachial Index (ABI) - do not check if billed separately 1 15 Has the patient been seen at the hospital within the last three years: Yes Total Score: 135 Level Of Care: New/Established - Level 4 Electronic Signature(s) Signed: 10/29/2016 4:07:04 PM By: Alric Quan Entered By: Alric Quan on 10/28/2016 17:24:05 Alexandria [name] ([id number]) -------------------------------------------------------------------------------- Encounter Discharge Information Details Patient Name: Alexandria [name] Date of Service: 10/28/2016 12:45 PM Medical Record Number: [id number] Patient Account Number: [id number] Date of Birth/Sex: 25-Mar-1935 (81 y.o.  Female) Treating RN: Montey Hora Primary Care Silverio Hagan: Elsie Stain Other Clinician: Referring Nicolo Tomko: Elsie Stain Treating Eryca Bolte/Extender: Frann Rider in Treatment: 0 Encounter Discharge Information Items Discharge Pain Level: 0 Discharge Condition: Stable Ambulatory Status: Wheelchair Discharge Destination: Home Transportation: Private Auto Accompanied By: daughter Schedule Follow-up Appointment: Yes Medication Reconciliation completed and provided to Patient/Care No Edita Weyenberg: Provided on Clinical Summary of Care: 10/28/2016 Form Type Recipient Paper Patient CG Electronic Signature(s) Signed: 10/29/2016 4:07:04 PM By: Alric Quan Entered By: Alric Quan on 10/28/2016 17:23:10 Alexandria [name] ([id number]) -------------------------------------------------------------------------------- Lower Extremity Assessment Details Patient Name: Alexandria [name] Date of Service: 10/28/2016 12:45 PM Medical Record Number: [id number] Patient Account Number: [id number] Date of Birth/Sex: 04-Apr-1935 (81 y.o. Female) Treating RN: Carolyne Fiscal, Debi Primary Care Caid Radin: Elsie Stain Other Clinician: Referring Lakisa Lotz: Elsie Stain Treating Dravin Lance/Extender: Frann Rider in Treatment: 0 Edema Assessment Assessed: [Left: No] [Right: No] Edema: [Left: Ye] [Right: s] Calf Left: Right: Point of Measurement: 33 cm From Medial Instep cm 34.5 cm Ankle Left: Right: Point of Measurement: 10 cm From Medial Instep cm 23.2 cm Vascular Assessment Pulses: Dorsalis Pedis Palpable: [Right:Yes] Posterior Tibial Extremity colors, hair growth, and conditions: Extremity Color: [Right:Red] Temperature of Extremity: [Right:Warm] Capillary Refill: [Right:< 3 seconds] Blood Pressure: Brachial: [Right:140] Dorsalis Pedis: [Left:Dorsalis Pedis: 120] Ankle: Posterior Tibial: [Left:Posterior Tibial: 140] [Right:1.00] Toe Nail Assessment Left: Right: Thick:  No Discolored: No Deformed: No Improper Length and Hygiene: No Electronic Signature(s) Signed: 10/29/2016 4:07:04 PM By: Alen Bleacher ([id number]) Entered By: Alric Quan on 10/28/2016 13:36:09 Alexandria [name] ([id number]) -------------------------------------------------------------------------------- Multi Wound Chart Details Patient Name: Alexandria [name] Date of Service: 10/28/2016 12:45 PM Medical Record Number: [id number] Patient Account Number: [id number] Date of Birth/Sex: [birth year] (81 y.o. Female) Treating RN: Montey Hora Primary Care [place]: Elsie Stain Other Clinician: Referring Jordan Caraveo: Elsie Stain Treating Aubrey Voong/Extender: Frann Rider in Treatment: 0 Vital Signs Height(in): 65 Pulse(bpm): 78 Weight(lbs): 219 Blood Pressure 136/62 (mmHg): Body Mass Index(BMI): 36 Temperature(F): 97.5 Respiratory Rate 18 (breaths/min): Photos: [1:No Photos] [  N/A:N/A] Wound Location: [1:Right Knee] [N/A:N/A] Wounding Event: [1:Trauma] [N/A:N/A] Primary Etiology: [1:Diabetic Wound/Ulcer of the Lower Extremity] [N/A:N/A] Secondary Etiology: [1:Trauma, Other] [N/A:N/A] Comorbid History: [1:Type II Diabetes] [N/A:N/A] Date Acquired: [1:10/01/2016] [N/A:N/A] Weeks of Treatment: [1:0] [N/A:N/A] Wound Status: [1:Open] [N/A:N/A] Measurements L x W x D 0.3x15.3x0.3 [N/A:N/A] (cm) Area (cm) : [1:3.605] [N/A:N/A] Volume (cm) : [1:1.081] [N/A:N/A] Classification: [1:Grade 1] [N/A:N/A] Exudate Amount: [1:Large] [N/A:N/A] Exudate Type: [1:Serous] [N/A:N/A] Exudate Color: [1:amber] [N/A:N/A] Wound Margin: [1:Distinct, outline attached] [N/A:N/A] Granulation Amount: [1:Small (1-33%)] [N/A:N/A] Granulation Quality: [1:Red] [N/A:N/A] Necrotic Amount: [1:Large (67-100%)] [N/A:N/A] Necrotic Tissue: [1:Eschar, Adherent Slough] [N/A:N/A] Epithelialization: [1:None] [N/A:N/A] Periwound Skin Texture: No Abnormalities Noted  [N/A:N/A] Periwound Skin [1:Maceration: Yes] [N/A:N/A] Moisture: Periwound Skin Color: No Abnormalities Noted [N/A:N/A] Temperature: [1:No Abnormality] [N/A:N/A] Tenderness on Yes N/A N/A Palpation: Wound Preparation: Ulcer Cleansing: N/A N/A Rinsed/Irrigated with Saline Topical Anesthetic Applied: Other: lidocaine 4% Treatment Notes Electronic Signature(s) Signed: 10/29/2016 4:07:04 PM By: Alric Quan Previous Signature: 10/28/2016 3:24:30 PM Version By: Christin Fudge MD, FACS Entered By: Alric Quan on 10/28/2016 17:13:27 Alexandria [name] ([id number]) -------------------------------------------------------------------------------- [place] Details Patient Name: Alexandria [name] Date of Service: 10/28/2016 12:45 PM Medical Record Number: [id number] Patient Account Number: [id number] Date of Birth/Sex: March 22, 1935 (81 y.o. Female) Treating RN: Carolyne Fiscal, Debi Primary Care Tashika Goodin: Elsie Stain Other Clinician: Referring Azya Barbero: Elsie Stain Treating Candela Krul/Extender: Frann Rider in Treatment: 0 Active Inactive ` Abuse / Safety / Falls / Self Care Management Nursing Diagnoses: History of Falls Potential for falls Goals: Patient will not experience any injury related to falls Date Initiated: 10/28/2016 Target Resolution Date: 01/15/2017 Goal Status: Active Interventions: Assess fall risk on admission and as needed Assess: immobility, friction, shearing, incontinence upon admission and as needed Notes: ` Nutrition Nursing Diagnoses: Imbalanced nutrition Impaired glucose control: actual or potential Potential for alteratiion in Nutrition/Potential for imbalanced nutrition Goals: Patient/caregiver agrees to and verbalizes understanding of need to use nutritional supplements and/or vitamins as prescribed Date Initiated: 10/28/2016 Target Resolution Date: 01/15/2017 Goal Status: Active Patient/caregiver will maintain  therapeutic glucose control Date Initiated: 10/28/2016 Target Resolution Date: 02/12/2017 Goal Status: Active Interventions: Assess patient nutrition upon admission and as needed per policy DEZERAE, FREIBERGER ([id number]) Provide education on elevated blood sugars and impact on wound healing Notes: ` Orientation to the Wound Care Program Nursing Diagnoses: Knowledge deficit related to the wound healing center program Goals: Patient/caregiver will verbalize understanding of the [facility] Date Initiated: 10/28/2016 Target Resolution Date: 11/13/2016 Goal Status: Active Interventions: Provide education on orientation to the wound center Notes: ` Pain, Acute or Chronic Nursing Diagnoses: Pain, acute or chronic: actual or potential Potential alteration in comfort, pain Goals: Patient/caregiver will verbalize adequate pain control between visits Date Initiated: 10/28/2016 Target Resolution Date: 02/12/2017 Goal Status: Active Interventions: Assess comfort goal upon admission Notes: ` Wound/Skin Impairment Nursing Diagnoses: Impaired tissue integrity Knowledge deficit related to ulceration/compromised skin integrity Goals: Ulcer/skin breakdown will have a volume reduction of 80% by week 12 Date Initiated: 10/28/2016 Target Resolution Date: 02/05/2017 Goal Status: Active VICKEE, MORMINO ([id number]) Interventions: Assess patient/caregiver ability to perform ulcer/skin care regimen upon admission and as needed Assess ulceration(s) every visit Notes: Electronic Signature(s) Signed: 10/29/2016 4:07:04 PM By: Alric Quan Entered By: Alric Quan on 10/28/2016 17:13:11 Alexandria [name] ([id number]) -------------------------------------------------------------------------------- Pain Assessment Details Patient Name: Alexandria [name] Date of Service: 10/28/2016 12:45 PM Medical Record Number: [id number] Patient Account Number: [id number] Date of Birth/Sex:  1935-10-17 (81 y.o. Female) Treating RN: Carolyne Fiscal,  Debi Primary Care Rain Wil[place]: Elsie Stain Other Clinician: Referring Shriyans Kuenzi: Elsie Stain Treating Kielee Care/Extender: Frann Rider in Treatment: 0 Active Problems Location of Pain Severity and Description of Pain Patient Has Paino No Site Locations Pain Management and Medication Current Pain Management: Electronic Signature(s) Signed: 10/29/2016 4:07:04 PM By: Alric Quan Entered By: Alric Quan on 10/28/2016 12:53:36 Alexandria [name] ([id number]) -------------------------------------------------------------------------------- Patient/Caregiver Education Details Patient Name: Alexandria [name] Date of Service: 10/28/2016 12:45 PM Medical Record Number: [id number] Patient Account Number: [id number] Date of Birth/Gender: 1935-12-25 (81 y.o. Female) Treating RN: Ahmed Prima Primary Care Physician: Elsie Stain Other Clinician: Referring Physician: Elsie Stain Treating Physician/Extender: Frann Rider in Treatment: 0 Education Assessment Education Provided To: Patient Education Topics Provided Elevated Blood Sugar/ Impact on Healing: Handouts: Elevated Blood Sugars: How Do They Affect Wound Healing Methods: Explain/Verbal Responses: State content correctly Welcome To [facility]: Handouts: Welcome To [facility] [place] [facility] Methods: Explain/Verbal Responses: State content correctly Wound/Skin Impairment: Handouts: Other: change dressing as ordered Methods: Demonstration, Explain/Verbal Responses: State content correctly Electronic Signature(s) Signed: 10/29/2016 4:07:04 PM By: Alric Quan Entered By: Alric Quan on 10/28/2016 17:23:29 Alexandria [name] ([id number]) -------------------------------------------------------------------------------- Wound Assessment Details Patient Name: Alexandria [name] Date of Service: 10/28/2016 12:45 PM Medical Record Number:  [id number] Patient Account Number: [id number] Date of Birth/Sex: Dec 16, 1935 (81 y.o. Female) Treating RN: Carolyne Fiscal, Debi Primary Care Fintan Grater: Elsie Stain Other Clinician: Referring Tremaine Earwood: Elsie Stain Treating Melik Blancett/Extender: Frann Rider in Treatment: 0 Wound Status Wound Number: 1 Primary Etiology: Diabetic Wound/Ulcer of the Lower Extremity Wound Location: Right Knee Secondary Trauma, Other Wounding Event: Trauma Etiology: Date Acquired: 10/01/2016 Wound Status: Open Weeks Of Treatment: 0 Comorbid Type II Diabetes Clustered Wound: No History: Photos Photo Uploaded By: Alric Quan on 10/28/2016 17:40:01 Wound Measurements Length: (cm) 0.3 Width: (cm) 15.3 Depth: (cm) 0.3 Area: (cm) 3.605 Volume: (cm) 1.081 % Reduction in Area: % Reduction in Volume: Epithelialization: None Tunneling: No Undermining: No Wound Description Classification: Grade 1 Foul Odor Aft Wound Margin: Distinct, outline attached Slough/Fibrin Exudate Amount: Large Exudate Type: Serous Exudate Color: amber er Cleansing: No o Yes Wound Bed Granulation Amount: Small (1-33%) Granulation Quality: Red Necrotic Amount: Large (67-100%) Necrotic Quality: Eschar, Adherent Slough Cupit, Nicholas E. ([id number]) Periwound Skin Texture Texture Color No Abnormalities Noted: No No Abnormalities Noted: No Moisture Temperature / Pain No Abnormalities Noted: No Temperature: No Abnormality Maceration: Yes Tenderness on Palpation: Yes Wound Preparation Ulcer Cleansing: Rinsed/Irrigated with Saline Topical Anesthetic Applied: Other: lidocaine 4%, Treatment Notes Wound #1 (Right Knee) 1. Cleansed with: Clean wound with Normal Saline 2. Anesthetic Topical Lidocaine 4% cream to wound bed prior to debridement 4. Dressing Applied: Prisma Ag Other dressing (specify in notes) 5. Secondary Dressing Applied ABD Pad Kerlix/Conform Non-Adherent pad 7. Secured  with Tape Notes steri-strips Electronic Signature(s) Signed: 10/29/2016 4:07:04 PM By: Alric Quan Entered By: Alric Quan on 10/28/2016 13:22:15 Alexandria [name] ([id number]) -------------------------------------------------------------------------------- [place] Details Patient Name: Alexandria [name] Date of Service: 10/28/2016 12:45 PM Medical Record Number: [id number] Patient Account Number: [id number] Date of Birth/Sex: November 06, 1935 (81 y.o. Female) Treating RN: Carolyne Fiscal, Debi Primary Care Charlet Harr: Elsie Stain Other Clinician: Referring Isabel Freese: Elsie Stain Treating Owens Hara/Extender: Frann Rider in Treatment: 0 Vital Signs Time Taken: 12:53 Temperature (F): 97.5 Height (in): 65 Pulse (bpm): 78 Source: Stated Respiratory Rate (breaths/min): 18 Weight (lbs): 219 Blood Pressure (mmHg): 136/62 Source: Measured Reference Range: 80 - 120 mg / dl Body Mass Index (BMI): 36.4 Electronic Signature(s) Signed: 10/29/2016  4:07:04 PM By: Alric Quan Entered By: Alric Quan on 10/28/2016 12:54:44

## 2016-10-31 NOTE — Progress Notes (Addendum)
Alexandria [name] ([id number]) Visit Report for 10/28/2016 Chief Complaint Document Details Patient Name: Alexandria [name], Alexandria [name] Date of Service: 10/28/2016 12:45 PM Medical Record Number: [id number] Patient Account Number: [id number] Date of Birth/Sex: 1935/07/31 (81 y.o. Female) Treating RN: Montey Hora Primary Care Provider: Elsie Stain Other Clinician: Referring Provider: Elsie Stain Treating Provider/Extender: Frann Rider in Treatment: 0 Information Obtained from: Patient Chief Complaint Patient presents to the wound care center for a consult due non healing wound to the right knee and swelling of the right lower extremity worse than the left for about 3 weeks now Electronic Signature(s) Signed: 10/28/2016 3:24:30 PM By: Christin Fudge MD, FACS Entered By: Christin Fudge on 10/28/2016 14:10:03 Alexandria [name] ([id number]) -------------------------------------------------------------------------------- HPI Details Patient Name: Alexandria [name] Date of Service: 10/28/2016 12:45 PM Medical Record Number: [id number] Patient Account Number: [id number] Date of Birth/Sex: 11/08/1935 (81 y.o. Female) Treating RN: Montey Hora Primary Care Provider: Elsie Stain Other Clinician: Referring Provider: Elsie Stain Treating Provider/Extender: Frann Rider in Treatment: 0 History of Present Illness HPI Description: 81 year old diabetic patient who recently was seen in the ER for a knee laceration which was closed with staples and then Steri-Stripped. She then developed a cellulitis on her right lower extremity and had to be treated with Septra and was previously on Keflex. She was asked to use a leg immobilizer and limited range of motion to 45o at the knee. The patient is known to have a past medical history of depression, hypothyroidism, hypertension, diastolic CHF, hyperlipidemia, diabetes mellitus, peripheral neuropathy. she is being treated by her pulmonologist  for COPD with emphysema with excarcebation and was given a course of prednisone recently. She also was asked to use a CPAP machine and other symptomatic treatment was given. Last hemoglobin A1c was 5.8% 2 months ago. 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 Female) Treating RN: Montey Hora Primary Care Provider: Elsie Stain Other Clinician: Referring Provider: Elsie Stain Treating Provider/Extender: Frann Rider in Treatment: 0 Constitutional . Pulse regular. Respirations normal and unlabored. Afebrile. . Eyes Nonicteric. Reactive to light. Ears, Nose, Mouth, and Throat Lips, teeth, and gums WNL.Marland Kitchen Moist mucosa without lesions. Neck supple and nontender. No palpable supraclavicular or cervical adenopathy. Normal sized without goiter. Respiratory WNL. No retractions.. Cardiovascular Pedal Pulses WNL. ABI on the right is 1.0. No clubbing, cyanosis but has significant lymphedema of the right lower extremity compared to the left. This is a stage II lymphedema. Gastrointestinal (GI) Abdomen without masses or tenderness.. No liver or spleen enlargement or tenderness.. Lymphatic No adneopathy. No adenopathy. No adenopathy. Musculoskeletal Adexa without tenderness or enlargement.. Digits and nails w/o clubbing, cyanosis, infection, petechiae, ischemia, or inflammatory conditions.. Integumentary (Hair, Skin) No suspicious lesions. No crepitus or fluctuance. No peri-wound warmth or erythema. No  masses.Marland Kitchen Psychiatric Judgement and insight Intact.. No evidence of depression, anxiety, or agitation.. Notes the lacerated wound on the knee has healed fairly well except the central area which has a minimal gape. The entire wound was prepped appropriately and half-inch Steri-Strips were applied. Electronic Signature(s) Signed: 10/28/2016 3:24:30 PM By: Christin Fudge MD, FACS Entered By: Christin Fudge on 10/28/2016 14:12:20 Alexandria [name] ([id number]) --------------------------------------------------------------------------------  Physician Orders Details Patient Name: Alexandria [name] Date of Service: 10/28/2016 12:45 PM Medical Record Number: [id number] Patient Account Number: [id number] Date of Birth/Sex: [birth year] (81 y.o. Female) Treating RN: Carolyne Fiscal, Debi Primary Care Provider: Elsie Stain Other Clinician: Referring Provider: Elsie Stain Treating Provider/Extender: Frann Rider in Treatment: 0 Verbal / Phone Orders: Yes Clinician: Pinkerton, Debi Read Back and Verified: Yes Diagnosis Coding Wound Cleansing Wound #1 Right Knee o Clean wound with Normal Saline. Anesthetic Wound #1 Right Knee o Topical Lidocaine 4% cream applied to wound bed prior to debridement Primary Wound Dressing Wound #1 Right Knee o Prisma Ag - moisten with saline o Other: - steri-strips (DO NOT REMOVE) Secondary Dressing Wound #1 Right Knee o ABD pad o Conform/Kerlix o Non-adherent pad Dressing Change Frequency Wound #1 Right Knee o Change dressing every other day. Follow-up Appointments Wound #1 Right Knee o Return Appointment in 1 week. Edema Control Wound #1 Right Knee o Elevate legs to the level of the heart and pump ankles as often as possible Additional Orders / Instructions Wound #1 Right Knee o Increase protein intake. ASHLI, SELDERS ([id number]) Medications-please add to medication list. Wound #1 Right Knee o Other: - Vitamin C, Zinc,  Multivitamin Services and Therapies o Venous Studies -Bilateral - Duplex studies [facility] Electronic Signature(s) Signed: 10/28/2016 5:32:15 PM By: Christin Fudge MD, FACS Signed: 10/29/2016 4:07:04 PM By: Alric Quan Previous Signature: 10/28/2016 3:24:30 PM Version By: Christin Fudge MD, FACS Entered By: Alric Quan on 10/28/2016 17:14:24 Alexandria [name] ([id number]) -------------------------------------------------------------------------------- Problem List Details Patient Name: Alexandria [name] Date of Service: 10/28/2016 12:45 PM Medical Record Number: [id number] Patient Account Number: [id number] Date of Birth/Sex: 11/21/1935 (81 y.o. Female) Treating RN: Montey Hora Primary Care Provider: Elsie Stain Other Clinician: Referring Provider: Elsie Stain Treating Provider/Extender: Frann Rider in Treatment: 0 Active Problems ICD-10 Encounter Code Description Active Date Diagnosis E11.622 Type 2 diabetes mellitus with other skin ulcer 10/28/2016 Yes S81.001A Unspecified open wound, right knee, initial encounter 10/28/2016 Yes I89.0 Lymphedema, not elsewhere classified 10/28/2016 Yes Inactive Problems Resolved Problems Electronic Signature(s) Signed: 10/28/2016 3:24:30 PM By: Christin Fudge MD, FACS Entered By: Christin Fudge on 10/28/2016 14:09:33 Alexandria [name] ([id number]) -------------------------------------------------------------------------------- Progress Note Details Patient Name: Alexandria [name] Date of Service: 10/28/2016 12:45 PM Medical Record Number: [id number] Patient Account Number: [id number] Date of Birth/Sex: Sep 17, 1935 (81 y.o. Female) Treating RN: Montey Hora Primary Care Provider: Elsie Stain Other Clinician: Referring Provider: Elsie Stain Treating Provider/Extender: Frann Rider in Treatment: 0 Subjective Chief Complaint Information obtained from Patient Patient presents to the wound care center for a  consult due non healing wound to the right knee and swelling of the right lower extremity worse than the left for about 3 weeks now History of Present Illness (HPI) 81 year old diabetic patient who recently was seen in the ER for a knee laceration which was closed with staples and then Steri-Stripped. She then developed a cellulitis on her right lower extremity and had to be treated with Septra and was previously on Keflex. She was asked to use a leg immobilizer and limited range of motion to 45 at the knee. The patient is known to have a past medical history of depression, hypothyroidism, hypertension, diastolic CHF, hyperlipidemia, diabetes mellitus, peripheral neuropathy. she is being treated by her pulmonologist for COPD with emphysema with excarcebation and was given a course of prednisone recently. She also was asked to use a CPAP machine and other symptomatic treatment was given. Last hemoglobin  A1c was 5.8% 2 months ago. she used to be a heavy smoker but has quit about 11 years ago Wound History Patient presents with 1 open wound that has been present for approximately 1 month. Patient has been treating wound in the following manner: dry bandage. Laboratory tests have not been performed in the last month. Patient reportedly has not tested positive for an antibiotic resistant organism. Patient reportedly has not tested positive for osteomyelitis. Patient reportedly has not had testing performed to evaluate circulation in the legs. Patient experiences the following problems associated with their wounds: swelling. Patient History Information obtained from Patient. Allergies antihistamines, diphenhydramine, Sulfa (Sulfonamide Antibiotics), Incruse Ellipta, clarithromycin, Spiriva with HandiHaler Family History Cancer - Siblings, Heart Disease - Mother, Lung Disease - Father, Siblings, Thyroid Problems - Mother, Siblings, Siblings, No family history of Diabetes, Hereditary  Spherocytosis, Hypertension, Kidney Disease, Seizures, Stroke, Tuberculosis. TARSHA, BLANDO ([id number]) Social History Never smoker, Marital Status - Married, Alcohol Use - Rarely, Drug Use - No History, Caffeine Use - Daily. Medical History Hematologic/Lymphatic Patient has history of Anemia Respiratory Patient has history of Chronic Obstructive Pulmonary Disease (COPD), Sleep Apnea Cardiovascular Patient has history of Congestive Heart Failure, Hypertension Endocrine Patient has history of Type II Diabetes Neurologic Patient has history of Neuropathy Patient is treated with Oral Agents. Review of Systems (ROS) Constitutional Symptoms (General Health) The patient has no complaints or symptoms. Eyes Complains or has symptoms of Glasses / Contacts. Respiratory Complains or has symptoms of Shortness of Breath - with exertion, chronic respiratory failure with hypoxia Cardiovascular hyperlipidemia heart murmur heart palpitations Gastrointestinal gerd ventral hernia diverticulosis hemorrhoids esophageal stenosis Endocrine Complains or has symptoms of Thyroid disease. Psychiatric depression Objective Constitutional Pulse regular. Respirations normal and unlabored. Afebrile. Vitals Time Taken: 12:53 PM, Height: 65 in, Source: Stated, Weight: 219 lbs, Source: Measured, BMI: 36.4, Temperature: 97.5 F, Pulse: 78 bpm, Respiratory Rate: 18 breaths/min, Blood Pressure: 136/62 mmHg. SUMEYA, YONTZ ([id number]) Eyes Nonicteric. Reactive to light. Ears, Nose, Mouth, and Throat Lips, teeth, and gums WNL.Marland Kitchen Moist mucosa without lesions. Neck supple and nontender. No palpable supraclavicular or cervical adenopathy. Normal sized without goiter. Respiratory WNL. No retractions.. Cardiovascular Pedal Pulses WNL. ABI on the right is 1.0. No clubbing, cyanosis but has significant lymphedema of the right lower extremity compared to the left. This is a stage II  lymphedema. Gastrointestinal (GI) Abdomen without masses or tenderness.. No liver or spleen enlargement or tenderness.. Lymphatic No adneopathy. No adenopathy. No adenopathy. Musculoskeletal Adexa without tenderness or enlargement.. Digits and nails w/o clubbing, cyanosis, infection, petechiae, ischemia, or inflammatory conditions.Marland Kitchen Psychiatric Judgement and insight Intact.. No evidence of depression, anxiety, or agitation.. General Notes: the lacerated wound on the knee has healed fairly well except the central area which has a minimal gape. The entire wound was prepped appropriately and half-inch Steri-Strips were applied. Integumentary (Hair, Skin) No suspicious lesions. No crepitus or fluctuance. No peri-wound warmth or erythema. No masses.. Wound #1 status is Open. Original cause of wound was Trauma. The wound is located on the Right Knee. The wound measures 0.3cm length x 15.3cm width x 0.3cm depth; 3.605cm^2 area and 1.081cm^3 volume. There is no tunneling or undermining noted. There is a large amount of serous drainage noted. The wound margin is distinct with the outline attached to the wound base. There is small (1-33%) red granulation within the wound bed. There is a large (67-100%) amount of necrotic tissue within the wound bed including Eschar and Adherent Slough. The periwound skin appearance  exhibited: Maceration. Periwound temperature was noted as No Abnormality. The periwound has tenderness on palpation. Assessment FRANCES, AMBROSINO ([id number]) Active Problems ICD-10 E11.622 - Type 2 diabetes mellitus with other skin ulcer S81.001A - Unspecified open wound, right knee, initial encounter I89.0 - Lymphedema, not elsewhere classified this 81 year old patient with several comorbidities recently had a lacerated wound of her right lower extremity which was the primary reason she has come to see me. Her diabetes is under good control but during the course of my examination I  found that she has significant right lower extremity lymphedema which has been never worked up in the past. After discussing with the patient's caregiver is her daughter I have recommended: 1. Leave the Steri-Strips on and apply Prisma to the open part of the wound and change this dressing every other day 2. Elevation and exercise has been discussed in great detail 3. Venous reflux studies to be done in [place] at the vein and vascular office 4. Good control of her diabetes mellitus 5. Regular visits to the wound center Plan Wound Cleansing: Wound #1 Right Knee: Clean wound with Normal Saline. Anesthetic: Wound #1 Right Knee: Topical Lidocaine 4% cream applied to wound bed prior to debridement Primary Wound Dressing: Wound #1 Right Knee: Prisma Ag - moisten with saline Other: - steri-strips (DO NOT REMOVE) Secondary Dressing: Wound #1 Right Knee: ABD pad Conform/Kerlix Non-adherent pad Dressing Change Frequency: Wound #1 Right Knee: Change dressing every other day. Follow-up Appointments: Wound #1 Right Knee: Return Appointment in 1 week. Edema Control: Wound #1 Right Knee: FERRIN, LIEBIG. ([id number]) Elevate legs to the level of the heart and pump ankles as often as possible Additional Orders / Instructions: Wound #1 Right Knee: Increase protein intake. Medications-please add to medication list.: Wound #1 Right Knee: Other: - Vitamin C, Zinc, Multivitamin Services and Therapies ordered were: Venous Studies -Bilateral - Duplex studies [facility] this 81 year old patient with several comorbidities recently had a lacerated wound of her right lower extremity which was the primary reason she has come to see me. Her diabetes is under good control but during the course of my examination I found that she has significant right lower extremity lymphedema which has been never worked up in the past. After discussing with the patient's caregiver is her daughter I have  recommended: 1. Leave the Steri-Strips on and apply Prisma to the open part of the wound and change this dressing every other day 2. Elevation and exercise has been discussed in great detail 3. Venous reflux studies to be done in [place] at the vein and vascular office 4. Good control of her diabetes mellitus 5. Regular visits to the wound center Electronic Signature(s) Signed: 11/05/2016 3:33:23 PM By: Christin Fudge MD, FACS Previous Signature: 10/28/2016 5:34:55 PM Version By: Christin Fudge MD, FACS Previous Signature: 10/28/2016 3:24:30 PM Version By: Christin Fudge MD, FACS Entered By: Christin Fudge on 11/04/2016 16:32:30 Alexandria [name] ([id number]) -------------------------------------------------------------------------------- ROS/PFSH Details Patient Name: Alexandria [name] Date of Service: 10/28/2016 12:45 PM Medical Record Number: [id number] Patient Account Number: [id number] Date of Birth/Sex: 21-Dec-1935 (81 y.o. Female) Treating RN: Carolyne Fiscal, Debi Primary Care Provider: Elsie Stain Other Clinician: Referring Provider: Elsie Stain Treating Provider/Extender: Frann Rider in Treatment: 0 Information Obtained From Patient Wound History Do you currently have one or more open woundso Yes How many open wounds do you currently haveo 1 Approximately how long have you had your woundso 1 month How have you been treating your wound(s) until nowo dry  bandage Has your wound(s) ever healed and then re-openedo No Have you had any lab work done in the past montho No Have you tested positive for an antibiotic resistant organism (MRSA, VRE)o No Have you tested positive for osteomyelitis (bone infection)o No Have you had any tests for circulation on your legso No Have you had other problems associated with your woundso Swelling Eyes Complaints and Symptoms: Positive for: Glasses / Contacts Respiratory Complaints and Symptoms: Positive for: Shortness of Breath - with  exertion Review of System Notes: chronic respiratory failure with hypoxia Medical History: Positive for: Chronic Obstructive Pulmonary Disease (COPD); Sleep Apnea Endocrine Complaints and Symptoms: Positive for: Thyroid disease Medical History: Positive for: Type II Diabetes Time with diabetes: 10 years Treated with: Oral agents Constitutional Symptoms (General Health) KRISSIE, MERRICK ([id number]) Complaints and Symptoms: No Complaints or Symptoms Hematologic/Lymphatic Medical History: Positive for: Anemia Cardiovascular Complaints and Symptoms: Review of System Notes: hyperlipidemia heart murmur heart palpitations Medical History: Positive for: Congestive Heart Failure; Hypertension Gastrointestinal Complaints and Symptoms: Review of System Notes: gerd ventral hernia diverticulosis hemorrhoids esophageal stenosis Neurologic Medical History: Positive for: Neuropathy Psychiatric Complaints and Symptoms: Review of System Notes: depression Immunizations Pneumococcal Vaccine: Received Pneumococcal Vaccination: Yes Family and Social History Cancer: Yes - Siblings; Diabetes: No; Heart Disease: Yes - Mother; Hereditary Spherocytosis: No; Hypertension: No; Kidney Disease: No; Lung Disease: Yes - Father, Siblings; Seizures: No; Stroke: No; Thyroid Problems: Yes - Mother, Siblings, Siblings; Tuberculosis: No; Never smoker; Marital Status - Married; Alcohol Use: Rarely; Drug Use: No History; Caffeine Use: Daily; Financial Concerns: No; Food, Clothing or Shelter Needs: No; Support System Lacking: No; Transportation Concerns: No; Advanced Elpers, Kayloni E. ([id number]) Directives: No; Patient does not want information on Advanced Directives; Do not resuscitate: No; Living Will: Yes (Not Provided); Medical Power of Attorney: Yes - [place] F[place]ce (Not Provided) Physician Affirmation I have reviewed and agree with the above information. Electronic Signature(s) Signed: 11/05/2016  3:33:23 PM By: Christin Fudge MD, FACS Signed: 11/09/2016 5:28:39 PM By: Alric Quan Previous Signature: 10/28/2016 3:24:30 PM Version By: Christin Fudge MD, FACS Previous Signature: 10/29/2016 4:07:04 PM Version By: Alric Quan Entered By: Alric Quan on 11/04/2016 11:49:19 Alexandria [name] ([id number]) -------------------------------------------------------------------------------- [place] Details Patient Name: Alexandria [name] Date of Service: 10/28/2016 Medical Record Number: [id number] Patient Account Number: [id number] Date of Birth/Sex: 1935/04/01 (81 y.o. Female) Treating RN: Montey Hora Primary Care Provider: Elsie Stain Other Clinician: Referring Provider: Elsie Stain Treating Provider/Extender: Frann Rider in Treatment: 0 Diagnosis Coding ICD-10 Codes Code Description E11.622 Type 2 diabetes mellitus with other skin ulcer S81.001A Unspecified open wound, right knee, initial encounter I89.0 Lymphedema, not elsewhere classified Facility Procedures CPT4 Code: [id number] Description: 99214 - WOUND CARE VISIT-LEV 4 EST PT Modifier: Quantity: 1 Physician Procedures CPT4 Code: [id number] Description: 80165 - WC PHYS LEVEL 4 - NEW PT ICD-10 Description Diagnosis E11.622 Type 2 diabetes mellitus with other skin ulcer S81.001A Unspecified open wound, right knee, initial enc I89.0 Lymphedema, not elsewhere classified Modifier: ounter Quantity: 1 Electronic Signature(s) Signed: 10/28/2016 5:32:15 PM By: Christin Fudge MD, FACS Signed: 10/29/2016 4:07:04 PM By: Alric Quan Previous Signature: 10/28/2016 3:24:30 PM Version By: Christin Fudge MD, FACS Entered By: Alric Quan on 10/28/2016 17:24:12

## 2016-10-31 NOTE — Progress Notes (Signed)
Alexandria [name], Alexandria [name] ([id number]) Visit Report for 10/28/2016 Abuse/Suicide Risk Screen Details Patient Name: Alexandria [name], Alexandria [name] Date of Service: 10/28/2016 12:45 PM Medical Record Number: [id number] Patient Account Number: [id number] Date of Birth/Sex: 05-10-1935 (81 y.o. Female) Treating RN: Carolyne Fiscal, Debi Primary Care Gabriela Irigoyen: Elsie Stain Other Clinician: Referring Abdulhadi Stopa: Elsie Stain Treating Trinitey Roache/Extender: Frann Rider in Treatment: 0 Abuse/Suicide Risk Screen Items Answer ABUSE/SUICIDE RISK SCREEN: Has anyone [name] to you tried to hurt or harm you recentlyo No Do you feel uncomfortable with anyone in your familyo No Has anyone forced you do things that you didnot want to doo No Do you have any thoughts of harming yourselfo No Patient displays signs or symptoms of abuse and/or neglect. No Electronic Signature(s) Signed: 10/29/2016 4:07:04 PM By: Alric Quan Entered By: Alric Quan on 10/28/2016 13:01:25 Alexandria [name] ([id number]) -------------------------------------------------------------------------------- Activities of Daily Living Details Patient Name: Alexandria [name] Date of Service: 10/28/2016 12:45 PM Medical Record Number: [id number] Patient Account Number: [id number] Date of Birth/Sex: 1935/12/11 (81 y.o. Female) Treating RN: Carolyne Fiscal, Debi Primary Care Amandalee Lacap: Elsie Stain Other Clinician: Referring Khaza Blansett: Elsie Stain Treating Audra Bellard/Extender: Frann Rider in Treatment: 0 Activities of Daily Living Items Answer Activities of Daily Living (Please select one for each item) Drive Automobile Not Able Take Medications Completely Able Use Telephone Completely Able Care for Appearance Completely Able Use Toilet Completely Able Bath / Shower Completely Able Dress Self Completely Able Feed Self Completely Able Walk Need Assistance Get In / Out Bed Need Assistance Housework Not Able Prepare Meals Not Able Handle  Money Need Assistance Shop for Self Not Able Electronic Signature(s) Signed: 10/29/2016 4:07:04 PM By: Alric Quan Entered By: Alric Quan on 10/28/2016 13:02:19 Alexandria [name] ([id number]) -------------------------------------------------------------------------------- Education Assessment Details Patient Name: Alexandria [name] Date of Service: 10/28/2016 12:45 PM Medical Record Number: [id number] Patient Account Number: [id number] Date of Birth/Sex: October 20, 1935 (81 y.o. Female) Treating RN: Carolyne Fiscal, Debi Primary Care Audree Schrecengost: Elsie Stain Other Clinician: Referring Haly Feher: Elsie Stain Treating Marvella Jenning/Extender: Frann Rider in Treatment: 0 Primary Learner Assessed: Patient Learning Preferences/Education Level/Primary Language Learning Preference: Explanation, Printed Material Highest Education Level: High School Preferred Language: English Cognitive Barrier Assessment/Beliefs Language Barrier: No Translator Needed: No Memory Deficit: No Emotional Barrier: No Cultural/Religious Beliefs Affecting Medical No Care: Physical Barrier Assessment Impaired Vision: Yes Glasses Impaired Hearing: No Decreased Hand dexterity: No Knowledge/Comprehension Assessment Knowledge Level: Medium Comprehension Level: Medium Ability to understand written Medium instructions: Ability to understand verbal Medium instructions: Motivation Assessment Anxiety Level: Calm Cooperation: Cooperative Education Importance: Acknowledges Need Interest in Health Problems: Asks Questions Perception: Coherent Willingness to Engage in Self- Medium Management Activities: Readiness to Engage in Self- Medium Management Activities: Electronic Signature(s) Alexandria [name], Alexandria [name] ([id number]) Signed: 10/29/2016 4:07:04 PM By: Alric Quan Entered By: Alric Quan on 10/28/2016 13:02:39 Alexandria [name]  ([id number]) -------------------------------------------------------------------------------- Fall Risk Assessment Details Patient Name: Alexandria [name] Date of Service: 10/28/2016 12:45 PM Medical Record Number: [id number] Patient Account Number: [id number] Date of Birth/Sex: [birth year] (81 y.o. Female) Treating RN: Carolyne Fiscal, Debi Primary Care Quavon Keisling: Elsie Stain Other Clinician: Referring Vern Guerette: Elsie Stain Treating Jerrin Recore/Extender: Frann Rider in Treatment: 0 Fall Risk Assessment Items Have you had 2 or more falls in the last 12 monthso 0 No Have you had any fall that resulted in injury in the last 12 monthso 0 Yes FALL RISK ASSESSMENT: History of falling - immediate or within 3 months 25 Yes Secondary diagnosis 0 No Ambulatory aid None/bed rest/wheelchair/nurse 0 Yes Crutches/cane/walker 15 Yes  Furniture 0 No IV Access/Saline Lock 0 No Gait/Training Normal/bed rest/immobile 0 No Weak 10 Yes Impaired 20 Yes Mental Status Oriented to own ability 0 Yes Electronic Signature(s) Signed: 10/29/2016 4:07:04 PM By: Alric Quan Entered By: Alric Quan on 10/28/2016 13:04:11 Alexandria [name] ([id number]) -------------------------------------------------------------------------------- Foot Assessment Details Patient Name: Alexandria [name] Date of Service: 10/28/2016 12:45 PM Medical Record Number: [id number] Patient Account Number: [id number] Date of Birth/Sex: 14-Aug-1935 (81 y.o. Female) Treating RN: Ahmed Prima Primary Care Daryan Cagley: Elsie Stain Other Clinician: Referring Revia Nghiem: Elsie Stain Treating Raed Schalk/Extender: Frann Rider in Treatment: 0 Foot Assessment Items Site Locations + = Sensation present, - = Sensation absent, C = Callus, U = Ulcer R = Redness, W = Warmth, M = Maceration, PU = Pre-ulcerative lesion F = Fissure, S = Swelling, D = Dryness Assessment Right: Left: Other Deformity: No No Prior Foot Ulcer: No  No Prior Amputation: No No Charcot Joint: No No Ambulatory Status: Ambulatory With Help Assistance Device: Walker Gait: Steady Electronic Signature(s) Signed: 10/29/2016 4:07:04 PM By: Alric Quan Entered By: Alric Quan on 10/28/2016 13:07:54 Alexandria [name] ([id number]) -------------------------------------------------------------------------------- Nutrition Risk Assessment Details Patient Name: Alexandria [name] Date of Service: 10/28/2016 12:45 PM Medical Record Number: [id number] Patient Account Number: [id number] Date of Birth/Sex: 15-Jun-1935 (81 y.o. Female) Treating RN: Carolyne Fiscal, Debi Primary Care Ayaka Andes: Elsie Stain Other Clinician: Referring Jaedin Regina: Elsie Stain Treating Ksenia Kunz/Extender: Frann Rider in Treatment: 0 Height (in): 65 Weight (lbs): 219 Body Mass Index (BMI): 36.4 Nutrition Risk Assessment Items NUTRITION RISK SCREEN: I have an illness or condition that made me change the kind and/or 0 No amount of food I eat I eat fewer than two meals per day 0 No I eat few fruits and vegetables, or milk products 0 No I have three or more drinks of beer, liquor or wine almost every day 0 No I have tooth or mouth problems that make it hard for me to eat 0 No I don't always have enough money to buy the food I need 0 No I eat alone most of the time 0 No I take three or more different prescribed or over-the-counter drugs a 1 Yes day Without wanting to, I have lost or gained 10 pounds in the last six 0 No months I am not always physically able to shop, cook and/or feed myself 0 No Nutrition Protocols Good Risk Protocol Moderate Risk Protocol Electronic Signature(s) Signed: 10/29/2016 4:07:04 PM By: Alric Quan Entered By: Alric Quan on 10/28/2016 13:04:21

## 2016-11-01 ENCOUNTER — Other Ambulatory Visit: Payer: Self-pay | Admitting: Surgery

## 2016-11-01 ENCOUNTER — Encounter: Payer: Self-pay | Admitting: Pulmonary Disease

## 2016-11-01 ENCOUNTER — Telehealth: Payer: Self-pay | Admitting: *Deleted

## 2016-11-01 DIAGNOSIS — L97911 Non-pressure chronic ulcer of unspecified part of right lower leg limited to breakdown of skin: Secondary | ICD-10-CM

## 2016-11-01 NOTE — Telephone Encounter (Signed)
Wes (Physical Therapist) with [facility] requested verbal orders. Wes would like orders for physical therapy for twice a week for 8 weeks. Wes stated that he would also like orders for oxygen stats while doing the PT. Call back [phone]

## 2016-11-01 NOTE — Telephone Encounter (Signed)
Please give the order.  Keep O2 sats >90% during PT.  Thanks.

## 2016-11-01 NOTE — Telephone Encounter (Signed)
Medication phoned to pharmacy.  

## 2016-11-02 DIAGNOSIS — J449 Chronic obstructive pulmonary disease, unspecified: Secondary | ICD-10-CM | POA: Diagnosis not present

## 2016-11-02 DIAGNOSIS — R0902 Hypoxemia: Secondary | ICD-10-CM | POA: Diagnosis not present

## 2016-11-02 NOTE — Telephone Encounter (Signed)
Left detailed message on voicemail of Wes, PT with [facility].

## 2016-11-03 ENCOUNTER — Ambulatory Visit (HOSPITAL_COMMUNITY)
Admission: RE | Admit: 2016-11-03 | Discharge: 2016-11-03 | Disposition: A | Discharge status: Home / Self Care | Payer: BLUE CROSS/BLUE SHIELD | Source: Ambulatory Visit | Attending: Vascular Surgery | Admitting: Vascular Surgery

## 2016-11-03 DIAGNOSIS — L97911 Non-pressure chronic ulcer of unspecified part of right lower leg limited to breakdown of skin: Secondary | ICD-10-CM | POA: Insufficient documentation

## 2016-11-04 ENCOUNTER — Telehealth: Payer: Self-pay | Admitting: Pulmonary Disease

## 2016-11-04 ENCOUNTER — Encounter: Payer: BLUE CROSS/BLUE SHIELD | Admitting: Surgery

## 2016-11-04 DIAGNOSIS — I110 Hypertensive heart disease with heart failure: Secondary | ICD-10-CM | POA: Diagnosis not present

## 2016-11-04 DIAGNOSIS — I890 Lymphedema, not elsewhere classified: Secondary | ICD-10-CM | POA: Diagnosis not present

## 2016-11-04 DIAGNOSIS — F329 Major depressive disorder, single episode, unspecified: Secondary | ICD-10-CM | POA: Diagnosis not present

## 2016-11-04 DIAGNOSIS — I5032 Chronic diastolic (congestive) heart failure: Secondary | ICD-10-CM | POA: Diagnosis not present

## 2016-11-04 DIAGNOSIS — G4733 Obstructive sleep apnea (adult) (pediatric): Secondary | ICD-10-CM

## 2016-11-04 DIAGNOSIS — S81001A Unspecified open wound, right knee, initial encounter: Secondary | ICD-10-CM | POA: Diagnosis not present

## 2016-11-04 DIAGNOSIS — E11622 Type 2 diabetes mellitus with other skin ulcer: Secondary | ICD-10-CM | POA: Diagnosis not present

## 2016-11-04 NOTE — Telephone Encounter (Signed)
ONO with RA 11/01/16 >> test time 6 hrs 43 min.  Average SpO2 89%, low SpO2 67%.  Spent 2 hrs 7 min with SpO2 < 88%.   Will have my nurse inform pt that ONO showed low oxygen level during the night.  Ideally she should use CPAP and 2 liters oxygen at night, since much of her low oxygen is related to sleep apnea.  If she is not able to use CPAP, then she will need to at least use 2 liters oxygen at night.  She should already have home oxygen set up.

## 2016-11-05 NOTE — Telephone Encounter (Signed)
Pt is leaving to go out of town 9/1-9/4 Cell # is the best contact, this was verified on her chart. Order placed for [facility] to show the patient how to bleed her O2 into her CPAP machine.  Nothing further needed.`

## 2016-11-07 NOTE — Progress Notes (Signed)
Alexandria [name] ([id number]) Visit Report for 11/04/2016 Chief Complaint Document Details Patient Name: Alexandria [name] Date of Service: 11/04/2016 1:30 PM Medical Record Number: [id number] Patient Account Number: [id number] Date of Birth/Sex: 1935/09/10 (81 y.o. Female) Treating RN: Carolyne Fiscal, Debi Primary Care Provider: Elsie Stain Other Clinician: Referring Provider: Elsie Stain Treating Provider/Extender: Frann Rider in Treatment: 1 Information Obtained from: Patient Chief Complaint Patient presents to the wound care center for a consult due non healing wound to the right knee and swelling of the right lower extremity worse than the left for about 3 weeks now Electronic Signature(s) Signed: 11/04/2016 4:28:32 PM By: Christin Fudge MD, FACS Entered By: Christin Fudge on 11/04/2016 13:49:44 Alexandria [name] ([id number]) -------------------------------------------------------------------------------- HPI Details Patient Name: Alexandria [name] Date of Service: 11/04/2016 1:30 PM Medical Record Number: [id number] Patient Account Number: [id number] Date of Birth/Sex: 18-Feb-1936 (81 y.o. Female) Treating RN: Carolyne Fiscal, Debi Primary Care Provider: Elsie Stain Other Clinician: Referring Provider: Elsie Stain Treating Provider/Extender: Frann Rider in Treatment: 1 History of Present Illness HPI Description: 81 year old diabetic patient who recently was seen in the ER for a knee laceration which was closed with staples and then Steri-Stripped. She then developed a cellulitis on her right lower extremity and had to be treated with Septra and was previously on Keflex. She was asked to use a leg immobilizer and limited range of motion to 45o at the knee. The patient is known to have a past medical history of depression, hypothyroidism, hypertension, diastolic CHF, hyperlipidemia, diabetes mellitus, peripheral neuropathy. she is being treated by her pulmonologist  for COPD with emphysema with excarcebation and was given a course of prednisone recently. She also was asked to use a CPAP machine and other symptomatic treatment was given. Last hemoglobin A1c was 5.8% 2 months ago. she used to be a heavy smoker but has quit about 11 years ago. 11/04/2016 -- the patient had a lower extremity venous duplex reflux evaluation done on 11/03/2016 and this showed no evidence of acute bilateral lower extremity deep vein thrombosis. She had reflux in bilateral common femoral veins and there was reflux in the bilateral saphenofemoral junction. However they were unable to obtain reflux in the bilateral great saphenous veins of the small saphenous veins and it was a difficult examination technically. [place] wanted some advice about wound management while they were to be Electronic Signature(s) Signed: 11/04/2016 4:28:32 PM By: Christin Fudge MD, FACS Entered By: Christin Fudge on 11/04/2016 13:51:16 Alexandria [name] ([id number]) -------------------------------------------------------------------------------- Physical Exam Details Patient Name: Alexandria [name] Date of Service: 11/04/2016 1:30 PM Medical Record Number: [id number] Patient Account Number: [id number] Date of Birth/Sex: 1936-02-17 (81 y.o. Female) Treating RN: Ahmed Prima Primary Care Provider: Elsie Stain Other Clinician: Referring Provider: Elsie Stain Treating Provider/Extender: Frann Rider in Treatment: 1 Constitutional . Pulse regular. Respirations normal and unlabored. Afebrile. . Eyes Nonicteric. Reactive to light. Ears, Nose, Mouth, and Throat Lips, teeth, and gums WNL.Marland Kitchen Moist mucosa without lesions. Neck supple and nontender. No palpable supraclavicular or cervical adenopathy. Normal sized without goiter. Respiratory WNL. No retractions.. Cardiovascular Pedal Pulses WNL. No clubbing, cyanosis or edema. Lymphatic No  adneopathy. No adenopathy. No adenopathy. Musculoskeletal Adexa without tenderness or enlargement.. Digits and nails w/o clubbing, cyanosis, infection, petechiae, ischemia, or inflammatory conditions.. Integumentary (Hair, Skin) No suspicious lesions. No crepitus or fluctuance. No peri-wound warmth or erythema. No masses.Marland Kitchen Psychiatric Judgement and insight Intact.. No evidence of depression, anxiety,  or agitation.. Notes the wound has some excoriation around it and there is a central area with minimal gape and some slough. Because of the excoriation I am unable to place Steri-Strips. Electronic Signature(s) Signed: 11/04/2016 4:28:32 PM By: Christin Fudge MD, FACS Entered By: Christin Fudge on 11/04/2016 13:52:25 Alexandria [name] ([id number]) -------------------------------------------------------------------------------- Physician Orders Details Patient Name: Alexandria [name] Date of Service: 11/04/2016 1:30 PM Medical Record Number: [id number] Patient Account Number: [id number] Date of Birth/Sex: 06/08/1935 (81 y.o. Female) Treating RN: Ahmed Prima Primary Care Provider: Elsie Stain Other Clinician: Referring Provider: Elsie Stain Treating Provider/Extender: Frann Rider in Treatment: 1 Verbal / Phone Orders: No Diagnosis Coding Wound Cleansing Wound #1 Right Knee o Clean wound with Normal Saline. Anesthetic Wound #1 Right Knee o Topical Lidocaine 4% cream applied to wound bed prior to debridement Secondary Dressing Wound #1 Right Knee o ABD pad o Non-adherent pad Dressing Change Frequency Wound #1 Right Knee o Change dressing every day. Follow-up Appointments Wound #1 Right Knee o Return Appointment in 1 week. Edema Control Wound #1 Right Knee o Elevate legs to the level of the heart and pump ankles as often as possible Additional Orders / Instructions Wound #1 Right Knee o Increase protein intake. Medications-please add to medication  list. Wound #1 Right Knee o Other: - Vitamin C, Zinc, Multivitamin Alexandria [name], Alexandria [name] ([id number]) Electronic Signature(s) Signed: 11/04/2016 4:28:32 PM By: Christin Fudge MD, FACS Signed: 11/05/2016 4:51:13 PM By: Alric Quan Entered By: Alric Quan on 11/04/2016 13:45:22 Alexandria [name] ([id number]) -------------------------------------------------------------------------------- Problem List Details Patient Name: Alexandria [name] Date of Service: 11/04/2016 1:30 PM Medical Record Number: [id number] Patient Account Number: [id number] Date of Birth/Sex: 05/20/1935 (81 y.o. Female) Treating RN: Carolyne Fiscal, Debi Primary Care Provider: Elsie Stain Other Clinician: Referring Provider: Elsie Stain Treating Provider/Extender: Frann Rider in Treatment: 1 Active Problems ICD-10 Encounter Code Description Active Date Diagnosis E11.622 Type 2 diabetes mellitus with other skin ulcer 10/28/2016 Yes S81.001A Unspecified open wound, right knee, initial encounter 10/28/2016 Yes I89.0 Lymphedema, not elsewhere classified 10/28/2016 Yes I87.331 Chronic venous hypertension (idiopathic) with ulcer and 11/04/2016 Yes inflammation of right lower extremity Inactive Problems Resolved Problems Electronic Signature(s) Signed: 11/04/2016 1:49:33 PM By: Christin Fudge MD, FACS Entered By: Christin Fudge on 11/04/2016 13:49:32 Alexandria [name] ([id number]) -------------------------------------------------------------------------------- Progress Note Details Patient Name: Alexandria [name] Date of Service: 11/04/2016 1:30 PM Medical Record Number: [id number] Patient Account Number: [id number] Date of Birth/Sex: Oct 12, 1935 (81 y.o. Female) Treating RN: Ahmed Prima Primary Care Provider: Elsie Stain Other Clinician: Referring Provider: Elsie Stain Treating Provider/Extender: Frann Rider in Treatment: 1 Subjective Chief Complaint Information obtained from  Patient Patient presents to the wound care center for a consult due non healing wound to the right knee and swelling of the right lower extremity worse than the left for about 3 weeks now History of Present Illness (HPI) 81 year old diabetic patient who recently was seen in the ER for a knee laceration which was closed with staples and then Steri-Stripped. She then developed a cellulitis on her right lower extremity and had to be treated with Septra and was previously on Keflex. She was asked to use a leg immobilizer and limited range of motion to 45 at the knee. The patient is known to have a past medical history of depression, hypothyroidism, hypertension, diastolic CHF, hyperlipidemia, diabetes mellitus, peripheral neuropathy. she is being treated by her pulmonologist for COPD with emphysema with excarcebation and was given a course of prednisone recently. She also  was asked to use a CPAP machine and other symptomatic treatment was given. Last hemoglobin A1c was 5.8% 2 months ago. she used to be a heavy smoker but has quit about 11 years ago. 11/04/2016 -- the patient had a lower extremity venous duplex reflux evaluation done on 11/03/2016 and this showed no evidence of acute bilateral lower extremity deep vein thrombosis. She had reflux in bilateral common femoral veins and there was reflux in the bilateral saphenofemoral junction. However they were unable to obtain reflux in the bilateral great saphenous veins of the small saphenous veins and it was a difficult examination technically. [place] wanted some advice about wound management while they were to be Objective Constitutional Pulse regular. Respirations normal and unlabored. Afebrile. Alexandria [name], Alexandria [name] ([id number]) Vitals Time Taken: 1:23 PM, Height: 65 in, Weight: 219 lbs, BMI: 36.4, Temperature: 97.7 F, Pulse: 88 bpm, Respiratory Rate: 20 breaths/min, Blood Pressure: 128/46  mmHg. Eyes Nonicteric. Reactive to light. Ears, Nose, Mouth, and Throat Lips, teeth, and gums WNL.Marland Kitchen Moist mucosa without lesions. Neck supple and nontender. No palpable supraclavicular or cervical adenopathy. Normal sized without goiter. Respiratory WNL. No retractions.. Cardiovascular Pedal Pulses WNL. No clubbing, cyanosis or edema. Lymphatic No adneopathy. No adenopathy. No adenopathy. Musculoskeletal Adexa without tenderness or enlargement.. Digits and nails w/o clubbing, cyanosis, infection, petechiae, ischemia, or inflammatory conditions.Marland Kitchen Psychiatric Judgement and insight Intact.. No evidence of depression, anxiety, or agitation.. General Notes: the wound has some excoriation around it and there is a central area with minimal gape and some slough. Because of the excoriation I am unable to place Steri-Strips. Integumentary (Hair, Skin) No suspicious lesions. No crepitus or fluctuance. No peri-wound warmth or erythema. No masses.. Wound #1 status is Open. Original cause of wound was Trauma. The wound is located on the Right Knee. The wound measures 2.5cm length x 10cm width x 0.2cm depth; 19.635cm^2 area and 3.927cm^3 volume. There is no tunneling or undermining noted. There is a large amount of serous drainage noted. The wound margin is distinct with the outline attached to the wound base. There is large (67-100%) red granulation within the wound bed. There is a small (1-33%) amount of necrotic tissue within the wound bed including Adherent Slough. The periwound skin appearance exhibited: Maceration. Periwound temperature was noted as No Abnormality. The periwound has tenderness on palpation. Assessment Alexandria [name], Alexandria [name] ([id number]) Active Problems ICD-10 E11.622 - Type 2 diabetes mellitus with other skin ulcer S81.001A - Unspecified open wound, right knee, initial encounter I89.0 - Lymphedema, not elsewhere classified I87.331 - Chronic venous hypertension (idiopathic) with  ulcer and inflammation of right lower extremity Plan Wound Cleansing: Wound #1 Right Knee: Clean wound with Normal Saline. Anesthetic: Wound #1 Right Knee: Topical Lidocaine 4% cream applied to wound bed prior to debridement Secondary Dressing: Wound #1 Right Knee: ABD pad Non-adherent pad Dressing Change Frequency: Wound #1 Right Knee: Change dressing every day. Follow-up Appointments: Wound #1 Right Knee: Return Appointment in 1 week. Edema Control: Wound #1 Right Knee: Elevate legs to the level of the heart and pump ankles as often as possible Additional Orders / Instructions: Wound #1 Right Knee: Increase protein intake. Medications-please add to medication list.: Wound #1 Right Knee: Other: - Vitamin C, Zinc, Multivitamin After review of her wound today, and discussing with the patient's caregiver her husband, I have recommended: Alexandria [name], Alexandria [name]. ([id number]) 1. telfa and gauze to the wound, with an ABD and change this dressing every  other day 2. Elevation and exercise has been discussed in great detail 3. Venous reflux studies done in [place] -- report discussed with them. once her problem has been sorted out at a later date we will need to repeat these. 4. Good control of her diabetes mellitus 5. Regular visits to the wound center Electronic Signature(s) Signed: 11/04/2016 4:28:32 PM By: Christin Fudge MD, FACS Entered By: Christin Fudge on 11/04/2016 13:54:34 Alexandria [name] ([id number]) -------------------------------------------------------------------------------- [place] Details Patient Name: Alexandria [name] Date of Service: 11/04/2016 Medical Record Number: [id number] Patient Account Number: [id number] Date of Birth/Sex: Jan 07, 1936 (81 y.o. Female) Treating RN: Carolyne Fiscal, Debi Primary Care Provider: Elsie Stain Other Clinician: Referring Provider: Elsie Stain Treating Provider/Extender: Frann Rider in Treatment: 1 Diagnosis  Coding ICD-10 Codes Code Description E11.622 Type 2 diabetes mellitus with other skin ulcer S81.001A Unspecified open wound, right knee, initial encounter I89.0 Lymphedema, not elsewhere classified Chronic venous hypertension (idiopathic) with ulcer and inflammation of right lower I87.331 extremity Facility Procedures CPT4 Code: [id number] Description: 99213 - WOUND CARE VISIT-LEV 3 EST PT Modifier: Quantity: 1 Physician Procedures CPT4: Description Modifier Quantity Code [id number] 99213 - WC PHYS LEVEL 3 - EST PT 1 ICD-10 Description Diagnosis E11.622 Type 2 diabetes mellitus with other skin ulcer S81.001A Unspecified open wound, right knee, initial encounter I89.0 Lymphedema, not  elsewhere classified I87.331 Chronic venous hypertension (idiopathic) with ulcer and inflammation of right lower extremity Electronic Signature(s) Signed: 11/04/2016 4:28:32 PM By: Christin Fudge MD, FACS Signed: 11/05/2016 4:51:13 PM By: Alric Quan Entered By: Alric Quan on 11/04/2016 15:06:14

## 2016-11-07 NOTE — Progress Notes (Signed)
Alexandria, [name] ([id number]) Visit Report for 11/04/2016 Arrival Information Details Patient Name: Alexandria [name], Alexandria [name] Date of Service: 11/04/2016 1:30 PM Medical Record Number: [id number] Patient Account Number: [id number] Date of Birth/Sex: August 06, 1935 (81 y.o. Female) Treating RN: Carolyne Fiscal, Debi Primary Care Delshon Blanchfield: Elsie Stain Other Clinician: Referring Jamoni Broadfoot: Elsie Stain Treating Kaleisha Bhargava/Extender: Frann Rider in Treatment: 1 Visit Information History Since Last Visit All ordered tests and consults were completed: No Patient Arrived: [name] Added or deleted any medications: No Arrival Time: 13:22 Any new allergies or adverse reactions: No Accompanied By: husband Had a fall or experienced change in No Transfer Assistance: None activities of daily living that may affect Patient Identification Verified: Yes risk of falls: Secondary Verification Process Yes Signs or symptoms of abuse/neglect since last No Completed: visito Patient Requires Transmission-Based No Hospitalized since last visit: No Precautions: Has Dressing in Place as Prescribed: Yes Patient Has Alerts: Yes Pain Present Now: No Patient Alerts: DM II Electronic Signature(s) Signed: 11/05/2016 4:51:13 PM By: Alric Quan Entered By: Alric Quan on 11/04/2016 13:23:09 Alexandria [name] ([id number]) -------------------------------------------------------------------------------- Clinic Level of Care Assessment Details Patient Name: Alexandria [name] Date of Service: 11/04/2016 1:30 PM Medical Record Number: [id number] Patient Account Number: [id number] Date of Birth/Sex: Sep 16, 1935 (81 y.o. Female) Treating RN: Carolyne Fiscal, Debi Primary Care Cree Kunert: Elsie Stain Other Clinician: Referring Ahmeer Tuman: Elsie Stain Treating Keondria Siever/Extender: Frann Rider in Treatment: 1 Clinic Level of Care Assessment Items TOOL 4 Quantity Score X - Use when only an EandM is performed on  FOLLOW-UP visit 1 0 ASSESSMENTS - Nursing Assessment / Reassessment X - Reassessment of Co-morbidities (includes updates in patient status) 1 10 X - Reassessment of Adherence to Treatment Plan 1 5 ASSESSMENTS - Wound and Skin Assessment / Reassessment X - Simple Wound Assessment / Reassessment - one wound 1 5 []  - Complex Wound Assessment / Reassessment - multiple wounds 0 []  - Dermatologic / Skin Assessment (not related to wound area) 0 ASSESSMENTS - Focused Assessment []  - Circumferential Edema Measurements - multi extremities 0 []  - Nutritional Assessment / Counseling / Intervention 0 []  - Lower Extremity Assessment (monofilament, tuning fork, pulses) 0 []  - Peripheral Arterial Disease Assessment (using hand held doppler) 0 ASSESSMENTS - Ostomy and/or Continence Assessment and Care []  - Incontinence Assessment and Management 0 []  - Ostomy Care Assessment and Management (repouching, etc.) 0 PROCESS - Coordination of Care X - Simple Patient / Family Education for ongoing care 1 15 []  - Complex (extensive) Patient / Family Education for ongoing care 0 []  - Staff obtains Programmer, systems, Records, Test Results / Process Orders 0 []  - Staff telephones HHA, Nursing Homes / Clarify orders / etc 0 []  - Routine Transfer to another Facility (non-emergent condition) 0 Mausolf, Huetter. ([id number]) []  - Routine Hospital Admission (non-emergent condition) 0 []  - New Admissions / Biomedical engineer / Ordering NPWT, Apligraf, etc. 0 []  - Emergency Hospital Admission (emergent condition) 0 X - Simple Discharge Coordination 1 10 []  - Complex (extensive) Discharge Coordination 0 PROCESS - Special Needs []  - Pediatric / Minor Patient Management 0 []  - Isolation Patient Management 0 []  - Hearing / Language / Visual special needs 0 []  - Assessment of Community assistance (transportation, D/C planning, etc.) 0 []  - Additional assistance / Altered mentation 0 []  - Support Surface(s) Assessment (bed,  cushion, seat, etc.) 0 INTERVENTIONS - Wound Cleansing / Measurement X - Simple Wound Cleansing - one wound 1 5 []  - Complex Wound Cleansing - multiple wounds 0 X -  Wound Imaging (photographs - any number of wounds) 1 5 []  - Wound Tracing (instead of photographs) 0 X - Simple Wound Measurement - one wound 1 5 []  - Complex Wound Measurement - multiple wounds 0 INTERVENTIONS - Wound Dressings []  - Small Wound Dressing one or multiple wounds 0 X - Medium Wound Dressing one or multiple wounds 1 15 []  - Large Wound Dressing one or multiple wounds 0 X - Application of Medications - topical 1 5 []  - Application of Medications - injection 0 INTERVENTIONS - Miscellaneous []  - External ear exam 0 Vanorman, Rickeya E. ([id number]) []  - Specimen Collection (cultures, biopsies, blood, body fluids, etc.) 0 []  - Specimen(s) / Culture(s) sent or taken to Lab for analysis 0 []  - Patient Transfer (multiple staff / Harrel Lemon Lift / Similar devices) 0 []  - Simple Staple / Suture removal (25 or less) 0 []  - Complex Staple / Suture removal (26 or more) 0 []  - Hypo / Hyperglycemic Management ([name] monitor of Blood Glucose) 0 []  - Ankle / Brachial Index (ABI) - do not check if billed separately 0 X - Vital Signs 1 5 Has the patient been seen at the hospital within the last three years: Yes Total Score: 85 Level Of Care: New/Established - Level 3 Electronic Signature(s) Signed: 11/05/2016 4:51:13 PM By: Alric Quan Entered By: Alric Quan on 11/04/2016 15:06:07 Alexandria [name] ([id number]) -------------------------------------------------------------------------------- Encounter Discharge Information Details Patient Name: Alexandria [name] Date of Service: 11/04/2016 1:30 PM Medical Record Number: [id number] Patient Account Number: [id number] Date of Birth/Sex: 02/02/1936 (81 y.o. Female) Treating RN: Carolyne Fiscal, Debi Primary Care Rohil Lesch: Elsie Stain Other Clinician: Referring Deserai Cansler: Elsie Stain Treating Gevork Ayyad/Extender: Frann Rider in Treatment: 1 Encounter Discharge Information Items Discharge Pain Level: 0 Discharge Condition: Stable Ambulatory Status: Walker Discharge Destination: Home Transportation: Private Auto Accompanied By: husband Schedule Follow-up Appointment: Yes Medication Reconciliation completed No and provided to Patient/Care Kerissa Coia: Provided on Clinical Summary of Care: 11/04/2016 Form Type Recipient Paper Patient CG Electronic Signature(s) Signed: 11/05/2016 8:49:50 AM By: Ruthine Dose Entered By: Ruthine Dose on 11/04/2016 13:53:57 Alexandria [name] ([id number]) -------------------------------------------------------------------------------- Lower Extremity Assessment Details Patient Name: Alexandria [name] Date of Service: 11/04/2016 1:30 PM Medical Record Number: [id number] Patient Account Number: [id number] Date of Birth/Sex: 12-29-1935 (81 y.o. Female) Treating RN: Carolyne Fiscal, Debi Primary Care Jerine Surles: Elsie Stain Other Clinician: Referring Clete Kuch: Elsie Stain Treating Zelena Bushong/Extender: Frann Rider in Treatment: 1 Vascular Assessment Pulses: Dorsalis Pedis Palpable: [Right:Yes] Posterior Tibial Extremity colors, hair growth, and conditions: Extremity Color: [Right:Red] Temperature of Extremity: [Right:Warm] Capillary Refill: [Right:< 3 seconds] Toe Nail Assessment Left: Right: Thick: No Discolored: No Deformed: No Improper Length and Hygiene: No Electronic Signature(s) Signed: 11/05/2016 4:51:13 PM By: Alric Quan Entered By: Alric Quan on 11/04/2016 13:35:12 Alexandria [name] ([id number]) -------------------------------------------------------------------------------- Multi Wound Chart Details Patient Name: Alexandria [name] Date of Service: 11/04/2016 1:30 PM Medical Record Number: [id number] Patient Account Number: [id number] Date of Birth/Sex: 18-Jun-1935 (81 y.o. Female) Treating  RN: Ahmed Prima Primary Care Quoc Tome: Elsie Stain Other Clinician: Referring Tinie Mcgloin: Elsie Stain Treating Jessee Newnam/Extender: Frann Rider in Treatment: 1 Vital Signs Height(in): 65 Pulse(bpm): 88 Weight(lbs): 219 Blood Pressure 128/46 (mmHg): Body Mass Index(BMI): 36 Temperature(F): 97.7 Respiratory Rate 20 (breaths/min): Photos: [1:No Photos] [N/A:N/A] Wound Location: [1:Right Knee] [N/A:N/A] Wounding Event: [1:Trauma] [N/A:N/A] Primary Etiology: [1:Diabetic Wound/Ulcer of the Lower Extremity] [N/A:N/A] Secondary Etiology: [1:Trauma, Other] [N/A:N/A] Comorbid History: [1:Anemia, Chronic Obstructive Pulmonary Disease (COPD), Sleep Apnea, Congestive Heart Failure, Hypertension, Type II Diabetes,  Neuropathy] [N/A:N/A] Date Acquired: [1:10/01/2016] [N/A:N/A] Weeks of Treatment: [1:1] [N/A:N/A] Wound Status: [1:Open] [N/A:N/A] Measurements L x W x D 2.5x10x0.2 [N/A:N/A] (cm) Area (cm) : [1:19.635] [N/A:N/A] Volume (cm) : [1:3.927] [N/A:N/A] % Reduction in Area: [1:-444.70%] [N/A:N/A] % Reduction in Volume: -263.30% [N/A:N/A] Classification: [1:Grade 1] [N/A:N/A] Exudate Amount: [1:Large] [N/A:N/A] Exudate Type: [1:Serous] [N/A:N/A] Exudate Color: [1:amber] [N/A:N/A] Wound Margin: [1:Distinct, outline attached] [N/A:N/A] Granulation Amount: [1:Large (67-100%)] [N/A:N/A] Granulation Quality: [1:Red] [N/A:N/A] Necrotic Amount: [1:Small (1-33%)] [N/A:N/A] Epithelialization: None N/A N/A Periwound Skin Texture: No Abnormalities Noted N/A N/A Periwound Skin Maceration: Yes N/A N/A Moisture: Periwound Skin Color: No Abnormalities Noted N/A N/A Temperature: No Abnormality N/A N/A Tenderness on Yes N/A N/A Palpation: Wound Preparation: Ulcer Cleansing: N/A N/A Rinsed/Irrigated with Saline Topical Anesthetic Applied: Other: lidocaine 4% Treatment Notes Wound #1 (Right Knee) 1. Cleansed with: Clean wound with Normal Saline 2. Anesthetic Topical  Lidocaine 4% cream to wound bed prior to debridement 5. Secondary Dressing Applied ABD Pad Non-Adherent pad 7. Secured with Recruitment consultant) Signed: 11/04/2016 4:28:32 PM By: Christin Fudge MD, FACS Entered By: Christin Fudge on 11/04/2016 13:49:38 Alexandria [name] ([id number]) -------------------------------------------------------------------------------- [place] Details Patient Name: Alexandria [name] Date of Service: 11/04/2016 1:30 PM Medical Record Number: [id number] Patient Account Number: [id number] Date of Birth/Sex: July 05, 1935 (81 y.o. Female) Treating RN: Carolyne Fiscal, Debi Primary Care Smera Guyette: Elsie Stain Other Clinician: Referring Sarissa Dern: Elsie Stain Treating Draven Laine/Extender: Frann Rider in Treatment: 1 Active Inactive ` Abuse / Safety / Falls / Self Care Management Nursing Diagnoses: History of Falls Potential for falls Goals: Patient will not experience any injury related to falls Date Initiated: 10/28/2016 Target Resolution Date: 01/15/2017 Goal Status: Active Interventions: Assess fall risk on admission and as needed Assess: immobility, friction, shearing, incontinence upon admission and as needed Notes: ` Nutrition Nursing Diagnoses: Imbalanced nutrition Impaired glucose control: actual or potential Potential for alteratiion in Nutrition/Potential for imbalanced nutrition Goals: Patient/caregiver agrees to and verbalizes understanding of need to use nutritional supplements and/or vitamins as prescribed Date Initiated: 10/28/2016 Target Resolution Date: 01/15/2017 Goal Status: Active Patient/caregiver will maintain therapeutic glucose control Date Initiated: 10/28/2016 Target Resolution Date: 02/12/2017 Goal Status: Active Interventions: Assess patient nutrition upon admission and as needed per policy JAELEE, LAUGHTER ([id number]) Provide education on elevated blood sugars and impact on wound  healing Notes: ` Orientation to the Wound Care Program Nursing Diagnoses: Knowledge deficit related to the wound healing center program Goals: Patient/caregiver will verbalize understanding of the [facility] Date Initiated: 10/28/2016 Target Resolution Date: 11/13/2016 Goal Status: Active Interventions: Provide education on orientation to the wound center Notes: ` Pain, Acute or Chronic Nursing Diagnoses: Pain, acute or chronic: actual or potential Potential alteration in comfort, pain Goals: Patient/caregiver will verbalize adequate pain control between visits Date Initiated: 10/28/2016 Target Resolution Date: 02/12/2017 Goal Status: Active Interventions: Assess comfort goal upon admission Notes: ` Wound/Skin Impairment Nursing Diagnoses: Impaired tissue integrity Knowledge deficit related to ulceration/compromised skin integrity Goals: Ulcer/skin breakdown will have a volume reduction of 80% by week 12 Date Initiated: 10/28/2016 Target Resolution Date: 02/05/2017 Goal Status: Active MATISSE, ROSKELLEY ([id number]) Interventions: Assess patient/caregiver ability to perform ulcer/skin care regimen upon admission and as needed Assess ulceration(s) every visit Notes: Electronic Signature(s) Signed: 11/05/2016 4:51:13 PM By: Alric Quan Entered By: Alric Quan on 11/04/2016 13:35:17 Alexandria [name] ([id number]) -------------------------------------------------------------------------------- Pain Assessment Details Patient Name: Alexandria [name] Date of Service: 11/04/2016 1:30 PM Medical Record Number: [id number] Patient Account Number: [id number] Date  of Birth/Sex: 06-Jun-1935 (81 y.o. Female) Treating RN: Ahmed Prima Primary Care Achillies Buehl: Elsie Stain Other Clinician: Referring Emina Ribaudo: Elsie Stain Treating Jeidi Gilles/Extender: Frann Rider in Treatment: 1 Active Problems Location of Pain Severity and Description of  Pain Patient Has Paino No Site Locations Pain Management and Medication Current Pain Management: Electronic Signature(s) Signed: 11/05/2016 4:51:13 PM By: Alric Quan Entered By: Alric Quan on 11/04/2016 13:23:18 Alexandria [name] ([id number]) -------------------------------------------------------------------------------- Patient/Caregiver Education Details Patient Name: Alexandria [name] Date of Service: 11/04/2016 1:30 PM Medical Record Number: [id number] Patient Account Number: [id number] Date of Birth/Gender: 04-27-1935 (81 y.o. Female) Treating RN: Ahmed Prima Primary Care Physician: Elsie Stain Other Clinician: Referring Physician: Elsie Stain Treating Physician/Extender: Frann Rider in Treatment: 1 Education Assessment Education Provided To: Patient Education Topics Provided Wound/Skin Impairment: Handouts: Other: change dressing as ordered Methods: Demonstration, Explain/Verbal Responses: State content correctly Electronic Signature(s) Signed: 11/05/2016 4:51:13 PM By: Alric Quan Entered By: Alric Quan on 11/04/2016 13:38:47 Alexandria [name] ([id number]) -------------------------------------------------------------------------------- Wound Assessment Details Patient Name: Alexandria [name] Date of Service: 11/04/2016 1:30 PM Medical Record Number: [id number] Patient Account Number: [id number] Date of Birth/Sex: 1936/01/14 (81 y.o. Female) Treating RN: Carolyne Fiscal, Debi Primary Care Abdoulaye Drum: Elsie Stain Other Clinician: Referring Farrin Shadle: Elsie Stain Treating Cosima Prentiss/Extender: Frann Rider in Treatment: 1 Wound Status Wound Number: 1 Primary Diabetic Wound/Ulcer of the Lower Etiology: Extremity Wound Location: Right Knee Secondary Trauma, Other Wounding Event: Trauma Etiology: Date Acquired: 10/01/2016 Wound Open Weeks Of Treatment: 1 Status: Clustered Wound: No Comorbid Anemia, Chronic  Obstructive History: Pulmonary Disease (COPD), Sleep Apnea, Congestive Heart Failure, Hypertension, Type II Diabetes, Neuropathy Photos Photo Uploaded By: Alric Quan on 11/04/2016 16:44:14 Wound Measurements Length: (cm) 2.5 Width: (cm) 10 Depth: (cm) 0.2 Area: (cm) 19.635 Volume: (cm) 3.927 % Reduction in Area: -444.7% % Reduction in Volume: -263.3% Epithelialization: None Tunneling: No Undermining: No Wound Description Classification: Grade 1 Wound Margin: Distinct, outline attached Exudate Amount: Large Exudate Type: Serous Exudate Color: amber Foul Odor After Cleansing: No Slough/Fibrino Yes Wound Bed Armel, Alayla E. ([id number]) Granulation Amount: Large (67-100%) Granulation Quality: Red Necrotic Amount: Small (1-33%) Necrotic Quality: Adherent Slough Periwound Skin Texture Texture Color No Abnormalities Noted: No No Abnormalities Noted: No Moisture Temperature / Pain No Abnormalities Noted: No Temperature: No Abnormality Maceration: Yes Tenderness on Palpation: Yes Wound Preparation Ulcer Cleansing: Rinsed/Irrigated with Saline Topical Anesthetic Applied: Other: lidocaine 4%, Treatment Notes Wound #1 (Right Knee) 1. Cleansed with: Clean wound with Normal Saline 2. Anesthetic Topical Lidocaine 4% cream to wound bed prior to debridement 5. Secondary Dressing Applied ABD Pad Non-Adherent pad 7. Secured with Recruitment consultant) Signed: 11/05/2016 4:51:13 PM By: Alric Quan Entered By: Alric Quan on 11/04/2016 13:33:36 Alexandria [name] ([id number]) -------------------------------------------------------------------------------- [place] Details Patient Name: Alexandria [name] Date of Service: 11/04/2016 1:30 PM Medical Record Number: [id number] Patient Account Number: [id number] Date of Birth/Sex: 02-11-1936 (81 y.o. Female) Treating RN: Carolyne Fiscal, Debi Primary Care Chloe Flis: Elsie Stain Other Clinician: Referring  Brenn Deziel: Elsie Stain Treating Alishia Lebo/Extender: Frann Rider in Treatment: 1 Vital Signs Time Taken: 13:23 Temperature (F): 97.7 Height (in): 65 Pulse (bpm): 88 Weight (lbs): 219 Respiratory Rate (breaths/min): 20 Body Mass Index (BMI): 36.4 Blood Pressure (mmHg): 128/46 Reference Range: 80 - 120 mg / dl Electronic Signature(s) Signed: 11/05/2016 4:51:13 PM By: Alric Quan Entered By: Alric Quan on 11/04/2016 13:28:33

## 2016-11-10 DIAGNOSIS — M170 Bilateral primary osteoarthritis of knee: Secondary | ICD-10-CM | POA: Diagnosis not present

## 2016-11-10 DIAGNOSIS — M25561 Pain in right knee: Secondary | ICD-10-CM | POA: Diagnosis not present

## 2016-11-10 DIAGNOSIS — M25562 Pain in left knee: Secondary | ICD-10-CM | POA: Diagnosis not present

## 2016-11-10 DIAGNOSIS — G8929 Other chronic pain: Secondary | ICD-10-CM | POA: Diagnosis not present

## 2016-11-11 DIAGNOSIS — M6281 Muscle weakness (generalized): Secondary | ICD-10-CM | POA: Diagnosis not present

## 2016-11-11 DIAGNOSIS — E1142 Type 2 diabetes mellitus with diabetic polyneuropathy: Secondary | ICD-10-CM | POA: Diagnosis not present

## 2016-11-11 DIAGNOSIS — M722 Plantar fascial fibromatosis: Secondary | ICD-10-CM | POA: Diagnosis not present

## 2016-11-11 DIAGNOSIS — M19079 Primary osteoarthritis, unspecified ankle and foot: Secondary | ICD-10-CM | POA: Diagnosis not present

## 2016-11-12 ENCOUNTER — Encounter: Payer: BLUE CROSS/BLUE SHIELD | Attending: Surgery | Admitting: Surgery

## 2016-11-12 ENCOUNTER — Telehealth: Payer: Self-pay | Admitting: Family Medicine

## 2016-11-12 DIAGNOSIS — E785 Hyperlipidemia, unspecified: Secondary | ICD-10-CM | POA: Diagnosis not present

## 2016-11-12 DIAGNOSIS — F329 Major depressive disorder, single episode, unspecified: Secondary | ICD-10-CM | POA: Diagnosis not present

## 2016-11-12 DIAGNOSIS — Z87891 Personal history of nicotine dependence: Secondary | ICD-10-CM | POA: Insufficient documentation

## 2016-11-12 DIAGNOSIS — E1142 Type 2 diabetes mellitus with diabetic polyneuropathy: Secondary | ICD-10-CM | POA: Diagnosis not present

## 2016-11-12 DIAGNOSIS — S81011A Laceration without foreign body, right knee, initial encounter: Secondary | ICD-10-CM | POA: Diagnosis not present

## 2016-11-12 DIAGNOSIS — G4730 Sleep apnea, unspecified: Secondary | ICD-10-CM | POA: Diagnosis not present

## 2016-11-12 DIAGNOSIS — I890 Lymphedema, not elsewhere classified: Secondary | ICD-10-CM | POA: Diagnosis not present

## 2016-11-12 DIAGNOSIS — L97212 Non-pressure chronic ulcer of right calf with fat layer exposed: Secondary | ICD-10-CM | POA: Insufficient documentation

## 2016-11-12 DIAGNOSIS — I87331 Chronic venous hypertension (idiopathic) with ulcer and inflammation of right lower extremity: Secondary | ICD-10-CM | POA: Diagnosis not present

## 2016-11-12 DIAGNOSIS — I110 Hypertensive heart disease with heart failure: Secondary | ICD-10-CM | POA: Diagnosis not present

## 2016-11-12 DIAGNOSIS — E11622 Type 2 diabetes mellitus with other skin ulcer: Secondary | ICD-10-CM | POA: Diagnosis present

## 2016-11-12 DIAGNOSIS — E039 Hypothyroidism, unspecified: Secondary | ICD-10-CM | POA: Insufficient documentation

## 2016-11-12 DIAGNOSIS — J449 Chronic obstructive pulmonary disease, unspecified: Secondary | ICD-10-CM | POA: Insufficient documentation

## 2016-11-12 DIAGNOSIS — S81001A Unspecified open wound, right knee, initial encounter: Secondary | ICD-10-CM | POA: Insufficient documentation

## 2016-11-12 DIAGNOSIS — Z882 Allergy status to sulfonamides status: Secondary | ICD-10-CM | POA: Insufficient documentation

## 2016-11-12 DIAGNOSIS — X58XXXA Exposure to other specified factors, initial encounter: Secondary | ICD-10-CM | POA: Diagnosis not present

## 2016-11-12 DIAGNOSIS — I5032 Chronic diastolic (congestive) heart failure: Secondary | ICD-10-CM | POA: Insufficient documentation

## 2016-11-12 DIAGNOSIS — K219 Gastro-esophageal reflux disease without esophagitis: Secondary | ICD-10-CM | POA: Diagnosis not present

## 2016-11-12 DIAGNOSIS — S81811A Laceration without foreign body, right lower leg, initial encounter: Secondary | ICD-10-CM | POA: Diagnosis not present

## 2016-11-12 NOTE — Telephone Encounter (Signed)
Patient called.  Justin,PA at [facility] is going to send a form from the [facility] about Diabetic Shoes.  Patient said he was going to send the form to [place] yesterday.  Patient said if she needs to be seen for Dr.Duncan to fill out the form,let her know.

## 2016-11-13 NOTE — Telephone Encounter (Signed)
I didn't ever see this come in. Let me know if you hear anything.  Thanks.

## 2016-11-14 NOTE — Progress Notes (Signed)
JOLIET, MALLOZZI ([id number]) Visit Report for 11/12/2016 Chief Complaint Document Details Patient Name: Alexandria [name], Alexandria [name] Date of Service: 11/12/2016 12:30 PM Medical Record Number: [id number] Patient Account Number: [id number] Date of Birth/Sex: Jun 25, 1935 (81 y.o. Female) Treating RN: Montey Hora Primary Care Provider: Elsie Stain Other Clinician: Referring Provider: Elsie Stain Treating Provider/Extender: Frann Rider in Treatment: 2 Information Obtained from: Patient Chief Complaint Patient presents to the wound care center for a consult due non healing wound to the right knee and swelling of the right lower extremity worse than the left for about 3 weeks now Electronic Signature(s) Signed: 11/12/2016 3:48:14 PM By: Christin Fudge MD, FACS Entered By: Christin Fudge on 11/12/2016 13:15:49 Alexandria [name] ([id number]) -------------------------------------------------------------------------------- HPI Details Patient Name: Alexandria [name] Date of Service: 11/12/2016 12:30 PM Medical Record Number: [id number] Patient Account Number: [id number] Date of Birth/Sex: November 24, 1935 (81 y.o. Female) Treating RN: Montey Hora Primary Care Provider: Elsie Stain Other Clinician: Referring Provider: Elsie Stain Treating Provider/Extender: Frann Rider in Treatment: 2 History of Present Illness HPI Description: 81 year old diabetic patient who recently was seen in the ER for a knee laceration which was closed with staples and then Steri-Stripped. She then developed a cellulitis on her right lower extremity and had to be treated with Septra and was previously on Keflex. She was asked to use a leg immobilizer and limited range of motion to 45o at the knee. The patient is known to have a past medical history of depression, hypothyroidism, hypertension, diastolic CHF, hyperlipidemia, diabetes mellitus, peripheral neuropathy. she is being treated by her pulmonologist  for COPD with emphysema with excarcebation and was given a course of prednisone recently. She also was asked to use a CPAP machine and other symptomatic treatment was given. Last hemoglobin A1c was 5.8% 2 months ago. she used to be a heavy smoker but has quit about 11 years ago. 11/04/2016 -- the patient had a lower extremity venous duplex reflux evaluation done on 11/03/2016 and this showed no evidence of acute bilateral lower extremity deep vein thrombosis. She had reflux in bilateral common femoral veins and there was reflux in the bilateral saphenofemoral junction. However they were unable to obtain reflux in the bilateral great saphenous veins of the small saphenous veins and it was a difficult examination technically. [place] wanted some advice about wound management while they were to be 11/12/2016 -- she returns after a vacation but has a new injury to her right lateral calf which she lacerated on a blunt object Electronic Signature(s) Signed: 11/12/2016 3:48:14 PM By: Christin Fudge MD, FACS Entered By: Christin Fudge on 11/12/2016 13:16:14 Alexandria [name] ([id number]) -------------------------------------------------------------------------------- Physical Exam Details Patient Name: Alexandria [name] Date of Service: 11/12/2016 12:30 PM Medical Record Number: [id number] Patient Account Number: [id number] Date of Birth/Sex: 03/19/1935 (81 y.o. Female) Treating RN: Montey Hora Primary Care Provider: Elsie Stain Other Clinician: Referring Provider: Elsie Stain Treating Provider/Extender: Frann Rider in Treatment: 2 Constitutional . Pulse regular. Respirations normal and unlabored. Afebrile. . Eyes Nonicteric. Reactive to light. Ears, Nose, Mouth, and Throat Lips, teeth, and gums WNL.Marland Kitchen Moist mucosa without lesions. Neck supple and nontender. No palpable supraclavicular or cervical adenopathy. Normal sized without  goiter. Respiratory WNL. No retractions.. Cardiovascular Pedal Pulses WNL. No clubbing, cyanosis or edema. Lymphatic No adneopathy. No adenopathy. No adenopathy. Musculoskeletal Adexa without tenderness or enlargement.. Digits and nails w/o clubbing, cyanosis, infection, petechiae, ischemia, or inflammatory conditions.. Integumentary (Hair, Skin)  No suspicious lesions. No crepitus or fluctuance. No peri-wound warmth or erythema. No masses.Marland Kitchen Psychiatric Judgement and insight Intact.. No evidence of depression, anxiety, or agitation.. Notes the wound on the right knee is looking very good and there is minimal opening but significant lymphedema. The new wound on the lateral part of her right calf has a skin flap still in place and we will try and protect this. It may necrose over time and we will deal with this appropriately Electronic Signature(s) Signed: 11/12/2016 3:48:14 PM By: Christin Fudge MD, FACS Entered By: Christin Fudge on 11/12/2016 13:17:10 Alexandria [name] ([id number]) -------------------------------------------------------------------------------- Physician Orders Details Patient Name: Alexandria [name] Date of Service: 11/12/2016 12:30 PM Medical Record Number: [id number] Patient Account Number: [id number] Date of Birth/Sex: Feb 26, 1936 (81 y.o. Female) Treating RN: Montey Hora Primary Care Provider: Elsie Stain Other Clinician: Referring Provider: Elsie Stain Treating Provider/Extender: Frann Rider in Treatment: 2 Verbal / Phone Orders: No Diagnosis Coding Wound Cleansing Wound #1 Right Knee o Clean wound with Normal Saline. Wound #2 Right,Lateral Lower Leg o Clean wound with Normal Saline. Anesthetic Wound #1 Right Knee o Topical Lidocaine 4% cream applied to wound bed prior to debridement Wound #2 Right,Lateral Lower Leg o Topical Lidocaine 4% cream applied to wound bed prior to debridement Primary Wound Dressing Wound #1 Right Knee o  Non-adherent pad Wound #2 Right,Lateral Lower Leg o Non-adherent pad Secondary Dressing Wound #1 Right Knee o ABD pad Wound #2 Right,Lateral Lower Leg o Gauze and Kerlix/Conform Dressing Change Frequency Wound #1 Right Knee o Change dressing every day. Wound #2 Right,Lateral Lower Leg o Change dressing every day. Follow-up Appointments RASHAWNDA, GABA ([id number]) Wound #1 Right Knee o Return Appointment in 1 week. Wound #2 Right,Lateral Lower Leg o Return Appointment in 1 week. Edema Control Wound #1 Right Knee o Patient to wear own compression stockings o Elevate legs to the level of the heart and pump ankles as often as possible Wound #2 Right,Lateral Lower Leg o Patient to wear own compression stockings o Elevate legs to the level of the heart and pump ankles as often as possible Additional Orders / Instructions Wound #1 Right Knee o Increase protein intake. Wound #2 Right,Lateral Lower Leg o Increase protein intake. Medications-please add to medication list. Wound #1 Right Knee o Other: - Vitamin C, Zinc, Multivitamin Wound #2 Right,Lateral Lower Leg o Other: - Vitamin C, Zinc, Multivitamin Electronic Signature(s) Signed: 11/12/2016 3:48:14 PM By: Christin Fudge MD, FACS Signed: 11/12/2016 5:02:56 PM By: Montey Hora Entered By: Montey Hora on 11/12/2016 13:23:20 Alexandria [name] ([id number]) -------------------------------------------------------------------------------- Problem List Details Patient Name: Alexandria [name] Date of Service: 11/12/2016 12:30 PM Medical Record Number: [id number] Patient Account Number: [id number] Date of Birth/Sex: 06/30/1935 (81 y.o. Female) Treating RN: Montey Hora Primary Care Provider: Elsie Stain Other Clinician: Referring Provider: Elsie Stain Treating Provider/Extender: Frann Rider in Treatment: 2 Active Problems ICD-10 Encounter Code Description Active  Date Diagnosis E11.622 Type 2 diabetes mellitus with other skin ulcer 10/28/2016 Yes S81.001A Unspecified open wound, right knee, initial encounter 10/28/2016 Yes I89.0 Lymphedema, not elsewhere classified 10/28/2016 Yes I87.331 Chronic venous hypertension (idiopathic) with ulcer and 11/04/2016 Yes inflammation of right lower extremity L97.212 Non-pressure chronic ulcer of right calf with fat layer 11/12/2016 Yes exposed Inactive Problems Resolved Problems Electronic Signature(s) Signed: 11/12/2016 1:15:37 PM By: Christin Fudge MD, FACS Entered By: Christin Fudge on 11/12/2016 13:15:36 Alexandria [name] ([id number]) -------------------------------------------------------------------------------- Progress Note Details Patient Name: Alexandria [name] Date of Service: 11/12/2016  12:30 PM Medical Record Number: [id number] Patient Account Number: [id number] Date of Birth/Sex: 12-05-1935 (81 y.o. Female) Treating RN: Montey Hora Primary Care Provider: Elsie Stain Other Clinician: Referring Provider: Elsie Stain Treating Provider/Extender: Frann Rider in Treatment: 2 Subjective Chief Complaint Information obtained from Patient Patient presents to the wound care center for a consult due non healing wound to the right knee and swelling of the right lower extremity worse than the left for about 3 weeks now History of Present Illness (HPI) 81 year old diabetic patient who recently was seen in the ER for a knee laceration which was closed with staples and then Steri-Stripped. She then developed a cellulitis on her right lower extremity and had to be treated with Septra and was previously on Keflex. She was asked to use a leg immobilizer and limited range of motion to 45 at the knee. The patient is known to have a past medical history of depression, hypothyroidism, hypertension, diastolic CHF, hyperlipidemia, diabetes mellitus, peripheral neuropathy. she is being treated by her  pulmonologist for COPD with emphysema with excarcebation and was given a course of prednisone recently. She also was asked to use a CPAP machine and other symptomatic treatment was given. Last hemoglobin A1c was 5.8% 2 months ago. she used to be a heavy smoker but has quit about 11 years ago. 11/04/2016 -- the patient had a lower extremity venous duplex reflux evaluation done on 11/03/2016 and this showed no evidence of acute bilateral lower extremity deep vein thrombosis. She had reflux in bilateral common femoral veins and there was reflux in the bilateral saphenofemoral junction. However they were unable to obtain reflux in the bilateral great saphenous veins of the small saphenous veins and it was a difficult examination technically. [place] and wanted some advice about wound management while they were to be 11/12/2016 -- she returns after a vacation but has a new injury to her right lateral calf which she lacerated on a blunt object Objective Constitutional Emerick, Tiffiny E. ([id number]) Pulse regular. Respirations normal and unlabored. Afebrile. Vitals Time Taken: 12:43 PM, Height: 65 in, Weight: 219 lbs, BMI: 36.4, Temperature: 97.9 F, Pulse: 75 bpm, Respiratory Rate: 20 breaths/min, Blood Pressure: 145/69 mmHg. Eyes Nonicteric. Reactive to light. Ears, Nose, Mouth, and Throat Lips, teeth, and gums WNL.Marland Kitchen Moist mucosa without lesions. Neck supple and nontender. No palpable supraclavicular or cervical adenopathy. Normal sized without goiter. Respiratory WNL. No retractions.. Cardiovascular Pedal Pulses WNL. No clubbing, cyanosis or edema. Lymphatic No adneopathy. No adenopathy. No adenopathy. Musculoskeletal Adexa without tenderness or enlargement.. Digits and nails w/o clubbing, cyanosis, infection, petechiae, ischemia, or inflammatory conditions.Marland Kitchen Psychiatric Judgement and insight Intact.. No evidence of depression, anxiety, or  agitation.. General Notes: the wound on the right knee is looking very good and there is minimal opening but significant lymphedema. The new wound on the lateral part of her right calf has a skin flap still in place and we will try and protect this. It may necrose over time and we will deal with this appropriately Integumentary (Hair, Skin) No suspicious lesions. No crepitus or fluctuance. No peri-wound warmth or erythema. No masses.. Wound #1 status is Open. Original cause of wound was Trauma. The wound is located on the Right Knee. The wound measures 0.4cm length x 2.5cm width x 0.1cm depth; 0.785cm^2 area and 0.079cm^3 volume. There is no tunneling or undermining noted. There is a large amount of serous drainage noted. The wound margin is distinct with  the outline attached to the wound base. There is large (67-100%) red granulation within the wound bed. There is a small (1-33%) amount of necrotic tissue within the wound bed including Adherent Slough. The periwound skin appearance exhibited: Maceration. Periwound temperature was noted as No Abnormality. The periwound has tenderness on palpation. Wound #2 status is Open. Original cause of wound was Trauma. The wound is located on the Right,Lateral Lower Leg. The wound measures 1.4cm length x 1.8cm width x 0.1cm depth; 1.979cm^2 area and 0.198cm^3 volume. There is no tunneling or undermining noted. There is a large amount of serous drainage noted. The wound margin is flat and intact. There is no granulation within the wound bed. There is a large Duby, [place] ([id number]) (67-100%) amount of necrotic tissue within the wound bed including Eschar and Adherent Slough. The periwound skin appearance did not exhibit: Callus, Crepitus, Excoriation, Induration, Rash, Scarring, Dry/Scaly, Maceration, Atrophie Blanche, Cyanosis, Ecchymosis, Hemosiderin Staining, Mottled, Pallor, Rubor, Erythema. Periwound temperature was noted as No  Abnormality. Assessment Active Problems ICD-10 E11.622 - Type 2 diabetes mellitus with other skin ulcer S81.001A - Unspecified open wound, right knee, initial encounter I89.0 - Lymphedema, not elsewhere classified I87.331 - Chronic venous hypertension (idiopathic) with ulcer and inflammation of right lower extremity L97.212 - Non-pressure chronic ulcer of right calf with fat layer exposed Plan Wound Cleansing: Wound #1 Right Knee: Clean wound with Normal Saline. Wound #2 Right,Lateral Lower Leg: Clean wound with Normal Saline. Anesthetic: Wound #1 Right Knee: Topical Lidocaine 4% cream applied to wound bed prior to debridement Wound #2 Right,Lateral Lower Leg: Topical Lidocaine 4% cream applied to wound bed prior to debridement Primary Wound Dressing: Wound #1 Right Knee: Non-adherent pad Wound #2 Right,Lateral Lower Leg: Non-adherent pad Secondary Dressing: Wound #1 Right Knee: ABD pad Wound #2 Right,Lateral Lower Leg: Gauze and Kerlix/Conform Dressing Change Frequency: Wound #1 Right Knee: Change dressing every day. Wound #2 Right,Lateral Lower Leg: TASNIM, BALENTINE. ([id number]) Change dressing every day. Follow-up Appointments: Wound #1 Right Knee: Return Appointment in 1 week. Wound #2 Right,Lateral Lower Leg: Return Appointment in 1 week. Edema Control: Wound #1 Right Knee: Patient to wear own compression stockings Elevate legs to the level of the heart and pump ankles as often as possible Wound #2 Right,Lateral Lower Leg: Patient to wear own compression stockings Elevate legs to the level of the heart and pump ankles as often as possible Additional Orders / Instructions: Wound #1 Right Knee: Increase protein intake. Wound #2 Right,Lateral Lower Leg: Increase protein intake. Medications-please add to medication list.: Wound #1 Right Knee: Other: - Vitamin C, Zinc, Multivitamin Wound #2 Right,Lateral Lower Leg: Other: - Vitamin C, Zinc, Multivitamin After  review of her wound today, and discussing with the patient, I have recommended: 1. telfa and gauze to the wound, with an ABD and change this dressing every other day 2. a similar dressing to the new wound on the right lateral calf 3. Elevation and exercise has been discussed in great detail 4. Good control of her diabetes mellitus 5. Regular visits to the wound center Electronic Signature(s) Signed: 11/12/2016 4:29:57 PM By: Christin Fudge MD, FACS Previous Signature: 11/12/2016 3:48:14 PM Version By: Christin Fudge MD, FACS Entered By: Christin Fudge on 11/12/2016 15:51:31 Alexandria [name] ([id number]) -------------------------------------------------------------------------------- SuperBill Details Patient Name: Alexandria [name] Date of Service: 11/12/2016 Medical Record Number: [id number] Patient Account Number: [id number] Date of Birth/Sex: 13-Mar-1935 (81 y.o. Female) Treating RN: Montey Hora Primary Care Provider: Elsie Stain Other Clinician: Referring Provider:  Elsie Stain Treating Provider/Extender: Frann Rider in Treatment: 2 Diagnosis Coding ICD-10 Codes Code Description E11.622 Type 2 diabetes mellitus with other skin ulcer S81.001A Unspecified open wound, right knee, initial encounter I89.0 Lymphedema, not elsewhere classified Chronic venous hypertension (idiopathic) with ulcer and inflammation of right lower I87.331 extremity L97.212 Non-pressure chronic ulcer of right calf with fat layer exposed Facility Procedures CPT4 Code: 32992426 Description: 99213 - WOUND CARE VISIT-LEV 3 EST PT Modifier: Quantity: 1 Physician Procedures CPT4 Code: 8341962 Description: 22979 - WC PHYS LEVEL 3 - EST PT ICD-10 Description Diagnosis E11.622 Type 2 diabetes mellitus with other skin ulcer S81.001A Unspecified open wound, right knee, initial encoun L97.212 Non-pressure chronic ulcer of right calf with fat I89.0  Lymphedema, not elsewhere classified Modifier: ter layer  exposed Quantity: 1 Electronic Signature(s) Signed: 11/12/2016 5:02:56 PM By: Montey Hora Previous Signature: 11/12/2016 3:48:14 PM Version By: Christin Fudge MD, FACS Entered By: Montey Hora on 11/12/2016 16:53:27

## 2016-11-15 NOTE — Progress Notes (Signed)
Alexandria [name], Alexandria [name] ([id number]) Visit Report for 11/12/2016 Arrival Information Details Patient Name: Alexandria [name], Alexandria [name] Date of Service: 11/12/2016 12:30 PM Medical Record Number: [id number] Patient Account Number: [id number] Date of Birth/Sex: 02-14-1936 (81 y.o. Female) Treating RN: Montey Hora Primary Care Jalena Vanderlinden: Elsie Stain Other Clinician: Referring Blaklee Shores: Elsie Stain Treating Wynetta Seith/Extender: Frann Rider in Treatment: 2 Visit Information History Since Last Visit Added or deleted any medications: No Patient Arrived: Wheel Chair Any new allergies or adverse reactions: No Arrival Time: 12:41 Had a fall or experienced change in No activities of daily living that may affect Accompanied By: self risk of falls: Transfer Assistance: None Signs or symptoms of abuse/neglect since last No Patient Identification Verified: Yes visito Secondary Verification Process Yes Hospitalized since last visit: No Completed: Has Dressing in Place as Prescribed: Yes Patient Requires Transmission-Based No Pain Present Now: No Precautions: Patient Has Alerts: Yes Patient Alerts: DM II Electronic Signature(s) Signed: 11/12/2016 5:02:56 PM By: Montey Hora Entered By: Montey Hora on 11/12/2016 12:44:43 Alexandria [name] ([id number]) -------------------------------------------------------------------------------- Clinic Level of Care Assessment Details Patient Name: Alexandria [name] Date of Service: 11/12/2016 12:30 PM Medical Record Number: [id number] Patient Account Number: [id number] Date of Birth/Sex: [birth year] (81 y.o. Female) Treating RN: Montey Hora Primary Care Saraia Platner: Elsie Stain Other Clinician: Referring Jacon Whetzel: Elsie Stain Treating Mourad Cwikla/Extender: Frann Rider in Treatment: 2 Clinic Level of Care Assessment Items TOOL 4 Quantity Score []  - Use when only an EandM is performed on FOLLOW-UP visit 0 ASSESSMENTS - Nursing Assessment /  Reassessment X - Reassessment of Co-morbidities (includes updates in patient status) 1 10 X - Reassessment of Adherence to Treatment Plan 1 5 ASSESSMENTS - Wound and Skin Assessment / Reassessment []  - Simple Wound Assessment / Reassessment - one wound 0 X - Complex Wound Assessment / Reassessment - multiple wounds 2 5 []  - Dermatologic / Skin Assessment (not related to wound area) 0 ASSESSMENTS - Focused Assessment []  - Circumferential Edema Measurements - multi extremities 0 []  - Nutritional Assessment / Counseling / Intervention 0 X - Lower Extremity Assessment (monofilament, tuning fork, pulses) 1 5 []  - Peripheral Arterial Disease Assessment (using hand held doppler) 0 ASSESSMENTS - Ostomy and/or Continence Assessment and Care []  - Incontinence Assessment and Management 0 []  - Ostomy Care Assessment and Management (repouching, etc.) 0 PROCESS - Coordination of Care X - Simple Patient / Family Education for ongoing care 1 15 []  - Complex (extensive) Patient / Family Education for ongoing care 0 []  - Staff obtains Programmer, systems, Records, Test Results / Process Orders 0 []  - Staff telephones HHA, Nursing Homes / Clarify orders / etc 0 []  - Routine Transfer to another Facility (non-emergent condition) 0 Wolters, Francena E. ([id number]) []  - Routine Hospital Admission (non-emergent condition) 0 []  - New Admissions / Biomedical engineer / Ordering NPWT, Apligraf, etc. 0 []  - Emergency Hospital Admission (emergent condition) 0 X - Simple Discharge Coordination 1 10 []  - Complex (extensive) Discharge Coordination 0 PROCESS - Special Needs []  - Pediatric / Minor Patient Management 0 []  - Isolation Patient Management 0 []  - Hearing / Language / Visual special needs 0 []  - Assessment of Community assistance (transportation, D/C planning, etc.) 0 []  - Additional assistance / Altered mentation 0 []  - Support Surface(s) Assessment (bed, cushion, seat, etc.) 0 INTERVENTIONS - Wound Cleansing /  Measurement []  - Simple Wound Cleansing - one wound 0 X - Complex Wound Cleansing - multiple wounds 2 5 X - Wound Imaging (photographs - any number of  wounds) 1 5 []  - Wound Tracing (instead of photographs) 0 []  - Simple Wound Measurement - one wound 0 X - Complex Wound Measurement - multiple wounds 2 5 INTERVENTIONS - Wound Dressings X - Small Wound Dressing one or multiple wounds 2 10 []  - Medium Wound Dressing one or multiple wounds 0 []  - Large Wound Dressing one or multiple wounds 0 []  - Application of Medications - topical 0 []  - Application of Medications - injection 0 INTERVENTIONS - Miscellaneous []  - External ear exam 0 Uhde, Shakima E. ([id number]) []  - Specimen Collection (cultures, biopsies, blood, body fluids, etc.) 0 []  - Specimen(s) / Culture(s) sent or taken to Lab for analysis 0 []  - Patient Transfer (multiple staff / Harrel Lemon Lift / Similar devices) 0 []  - Simple Staple / Suture removal (25 or less) 0 []  - Complex Staple / Suture removal (26 or more) 0 []  - Hypo / Hyperglycemic Management ([name] monitor of Blood Glucose) 0 []  - Ankle / Brachial Index (ABI) - do not check if billed separately 0 X - Vital Signs 1 5 Has the patient been seen at the hospital within the last three years: Yes Total Score: 105 Level Of Care: New/Established - Level 3 Electronic Signature(s) Signed: 11/12/2016 5:02:56 PM By: Montey Hora Entered By: Montey Hora on 11/12/2016 16:34:40 Alexandria [name] ([id number]) -------------------------------------------------------------------------------- Encounter Discharge Information Details Patient Name: Alexandria [name] Date of Service: 11/12/2016 12:30 PM Medical Record Number: [id number] Patient Account Number: [id number] Date of Birth/Sex: 1935-08-10 (81 y.o. Female) Treating RN: Montey Hora Primary Care Armine Rizzolo: Elsie Stain Other Clinician: Referring Presli Fanguy: Elsie Stain Treating Chistian Kasler/Extender: Frann Rider in  Treatment: 2 Encounter Discharge Information Items Discharge Pain Level: 0 Discharge Condition: Stable Ambulatory Status: Wheelchair Discharge Destination: Home Transportation: Private Auto Accompanied By: self Schedule Follow-up Appointment: Yes Medication Reconciliation completed No and provided to Patient/Care Angla Delahunt: Provided on Clinical Summary of Care: 11/12/2016 Form Type Recipient Paper Patient CG Electronic Signature(s) Signed: 11/15/2016 10:12:15 AM By: Ruthine Dose Entered By: Ruthine Dose on 11/12/2016 13:25:12 Alexandria [name] ([id number]) -------------------------------------------------------------------------------- Lower Extremity Assessment Details Patient Name: Alexandria [name] Date of Service: 11/12/2016 12:30 PM Medical Record Number: [id number] Patient Account Number: [id number] Date of Birth/Sex: 01/21/1936 (81 y.o. Female) Treating RN: Montey Hora Primary Care Myleah Cavendish: Elsie Stain Other Clinician: Referring Franciso Dierks: Elsie Stain Treating Maliaka Brasington/Extender: Frann Rider in Treatment: 2 Vascular Assessment Pulses: Posterior Tibial Extremity colors, hair growth, and conditions: Extremity Color: [Right:Red] Hair Growth on Extremity: [Right:No] Temperature of Extremity: [Right:Warm] Capillary Refill: [Right:< 3 seconds] Electronic Signature(s) Signed: 11/12/2016 5:02:56 PM By: Montey Hora Entered By: Montey Hora on 11/12/2016 12:53:01 Alexandria [name] ([id number]) -------------------------------------------------------------------------------- Multi Wound Chart Details Patient Name: Alexandria [name] Date of Service: 11/12/2016 12:30 PM Medical Record Number: [id number] Patient Account Number: [id number] Date of Birth/Sex: 07-25-1935 (81 y.o. Female) Treating RN: Montey Hora Primary Care Eveny Anastas: Elsie Stain Other Clinician: Referring Trisha Ken: Elsie Stain Treating Jnai Snellgrove/Extender: Frann Rider in Treatment:  2 Vital Signs Height(in): 65 Pulse(bpm): 75 Weight(lbs): 219 Blood Pressure 145/69 (mmHg): Body Mass Index(BMI): 36 Temperature(F): 97.9 Respiratory Rate 20 (breaths/min): Photos: [1:No Photos] [2:No Photos] [N/A:N/A] Wound Location: [1:Right Knee] [2:Right Lower Leg - Lateral] [N/A:N/A] Wounding Event: [1:Trauma] [2:Trauma] [N/A:N/A] Primary Etiology: [1:Diabetic Wound/Ulcer of the Lower Extremity] [2:Diabetic Wound/Ulcer of the Lower Extremity] [N/A:N/A] Secondary Etiology: [1:Trauma, Other] [2:Trauma, Other] [N/A:N/A] Comorbid History: [1:Anemia, Chronic Obstructive Pulmonary Disease (COPD), Sleep Apnea, Congestive Heart Failure, Hypertension, Type II Diabetes, Neuropathy] [2:Anemia, Chronic Obstructive Pulmonary Disease (COPD),  Sleep Apnea, Congestive Heart Failure,  Hypertension, Type II Diabetes, Neuropathy] [N/A:N/A] Date Acquired: [1:10/01/2016] [2:11/06/2016] [N/A:N/A] Weeks of Treatment: [1:2] [2:0] [N/A:N/A] Wound Status: [1:Open] [2:Open] [N/A:N/A] Measurements L x W x D 0.4x2.5x0.1 [2:1.4x1.8x0.1] [N/A:N/A] (cm) Area (cm) : [1:0.785] [2:1.979] [N/A:N/A] Volume (cm) : [1:0.079] [2:0.198] [N/A:N/A] % Reduction in Area: [1:78.20%] [2:N/A] [N/A:N/A] % Reduction in Volume: 92.70% [2:N/A] [N/A:N/A] Classification: [1:Grade 1] [2:Grade 1] [N/A:N/A] Exudate Amount: [1:Large] [2:Large] [N/A:N/A] Exudate Type: [1:Serous] [2:Serous] [N/A:N/A] Exudate Color: [1:amber] [2:amber] [N/A:N/A] Wound Margin: [1:Distinct, outline attached] [2:Flat and Intact] [N/A:N/A] Granulation Amount: [1:Large (67-100%)] [2:None Present (0%)] [N/A:N/A] Granulation Quality: [1:Red] [2:N/A] [N/A:N/A] Necrotic Amount: [1:Small (1-33%)] [2:Large (67-100%)] [N/A:N/A] Necrotic Tissue: Adherent Slough Eschar, Adherent Slough N/A Epithelialization: None None N/A Periwound Skin Texture: No Abnormalities Noted Excoriation: No N/A Induration: No Callus: No Crepitus: No Rash: No Scarring:  No Periwound Skin Maceration: Yes Maceration: No N/A Moisture: Dry/Scaly: No Periwound Skin Color: No Abnormalities Noted Atrophie Blanche: No N/A Cyanosis: No Ecchymosis: No Erythema: No Hemosiderin Staining: No Mottled: No Pallor: No Rubor: No Temperature: No Abnormality No Abnormality N/A Tenderness on Yes No N/A Palpation: Wound Preparation: Ulcer Cleansing: Ulcer Cleansing: N/A Rinsed/Irrigated with Rinsed/Irrigated with Saline Saline Topical Anesthetic Topical Anesthetic Applied: Other: lidocaine Applied: Other: lidocaine 4% 4% Treatment Notes Electronic Signature(s) Signed: 11/12/2016 3:48:14 PM By: Christin Fudge MD, FACS Entered By: Christin Fudge on 11/12/2016 13:15:41 Alexandria [name] ([id number]) -------------------------------------------------------------------------------- [place] Details Patient Name: Alexandria [name] Date of Service: 11/12/2016 12:30 PM Medical Record Number: [id number] Patient Account Number: [id number] Date of Birth/Sex: 1935/11/25 (81 y.o. Female) Treating RN: Montey Hora Primary Care Callan Norden: Elsie Stain Other Clinician: Referring Tywaun Hiltner: Elsie Stain Treating Derrin Currey/Extender: Frann Rider in Treatment: 2 Active Inactive ` Abuse / Safety / Falls / Self Care Management Nursing Diagnoses: History of Falls Potential for falls Goals: Patient will not experience any injury related to falls Date Initiated: 10/28/2016 Target Resolution Date: 01/15/2017 Goal Status: Active Interventions: Assess fall risk on admission and as needed Assess: immobility, friction, shearing, incontinence upon admission and as needed Notes: ` Nutrition Nursing Diagnoses: Imbalanced nutrition Impaired glucose control: actual or potential Potential for alteratiion in Nutrition/Potential for imbalanced nutrition Goals: Patient/caregiver agrees to and verbalizes understanding of need to use nutritional supplements  and/or vitamins as prescribed Date Initiated: 10/28/2016 Target Resolution Date: 01/15/2017 Goal Status: Active Patient/caregiver will maintain therapeutic glucose control Date Initiated: 10/28/2016 Target Resolution Date: 02/12/2017 Goal Status: Active Interventions: Assess patient nutrition upon admission and as needed per policy KEANNA, TUGWELL ([id number]) Provide education on elevated blood sugars and impact on wound healing Notes: ` Orientation to the Wound Care Program Nursing Diagnoses: Knowledge deficit related to the wound healing center program Goals: Patient/caregiver will verbalize understanding of the [place] Date Initiated: 10/28/2016 Target Resolution Date: 11/13/2016 Goal Status: Active Interventions: Provide education on orientation to the wound center Notes: ` Pain, Acute or Chronic Nursing Diagnoses: Pain, acute or chronic: actual or potential Potential alteration in comfort, pain Goals: Patient/caregiver will verbalize adequate pain control between visits Date Initiated: 10/28/2016 Target Resolution Date: 02/12/2017 Goal Status: Active Interventions: Assess comfort goal upon admission Notes: ` Wound/Skin Impairment Nursing Diagnoses: Impaired tissue integrity Knowledge deficit related to ulceration/compromised skin integrity Goals: Ulcer/skin breakdown will have a volume reduction of 80% by week 12 Date Initiated: 10/28/2016 Target Resolution Date: 02/05/2017 Goal Status: Active TAQUISHA, PHUNG ([id number]) Interventions: Assess patient/caregiver ability to perform ulcer/skin care regimen upon admission and as needed Assess ulceration(s) every  visit Notes: Electronic Signature(s) Signed: 11/12/2016 5:02:56 PM By: Montey Hora Entered By: Montey Hora on 11/12/2016 12:53:30 Alexandria [name] ([id number]) -------------------------------------------------------------------------------- Pain Assessment Details Patient Name:  Alexandria [name] Date of Service: 11/12/2016 12:30 PM Medical Record Number: [id number] Patient Account Number: [id number] Date of Birth/Sex: April 16, 1935 (81 y.o. Female) Treating RN: Montey Hora Primary Care Matina Rodier: Elsie Stain Other Clinician: Referring Ronneisha Jett: Elsie Stain Treating Mckoy Bhakta/Extender: Frann Rider in Treatment: 2 Active Problems Location of Pain Severity and Description of Pain Patient Has Paino No Site Locations Pain Management and Medication Current Pain Management: Notes Topical or injectable lidocaine is offered to patient for acute pain when surgical debridement is performed. If needed, Patient is instructed to use over the counter pain medication for the following 24-48 hours after debridement. Wound care MDs do not prescribed pain medications. Patient has chronic pain or uncontrolled pain. Patient has been instructed to make an appointment with their Primary Care Physician for pain management. Electronic Signature(s) Signed: 11/12/2016 5:02:56 PM By: Montey Hora Entered By: Montey Hora on 11/12/2016 12:44:50 Alexandria [name] ([id number]) -------------------------------------------------------------------------------- Patient/Caregiver Education Details Patient Name: Alexandria [name] Date of Service: 11/12/2016 12:30 PM Medical Record Number: [id number] Patient Account Number: [id number] Date of Birth/Gender: 1935-10-23 (81 y.o. Female) Treating RN: Montey Hora Primary Care Physician: Elsie Stain Other Clinician: Referring Physician: Elsie Stain Treating Physician/Extender: Frann Rider in Treatment: 2 Education Assessment Education Provided To: Patient Education Topics Provided Wound/Skin Impairment: Handouts: Other: wound care as ordered Methods: Explain/Verbal Responses: State content correctly Electronic Signature(s) Signed: 11/12/2016 5:02:56 PM By: Montey Hora Entered By: Montey Hora on 11/12/2016  12:57:43 Alexandria [name] ([id number]) -------------------------------------------------------------------------------- Wound Assessment Details Patient Name: Alexandria [name] Date of Service: 11/12/2016 12:30 PM Medical Record Number: [id number] Patient Account Number: [id number] Date of Birth/Sex: 07-05-1935 (81 y.o. Female) Treating RN: Montey Hora Primary Care Logen Fowle: Elsie Stain Other Clinician: Referring Etna Forquer: Elsie Stain Treating Jayceon Troy/Extender: Frann Rider in Treatment: 2 Wound Status Wound Number: 1 Primary Diabetic Wound/Ulcer of the Lower Etiology: Extremity Wound Location: Right Knee Secondary Trauma, Other Wounding Event: Trauma Etiology: Date Acquired: 10/01/2016 Wound Open Weeks Of Treatment: 2 Status: Clustered Wound: No Comorbid Anemia, Chronic Obstructive History: Pulmonary Disease (COPD), Sleep Apnea, Congestive Heart Failure, Hypertension, Type II Diabetes, Neuropathy Wound Measurements Length: (cm) 0.4 Width: (cm) 2.5 Depth: (cm) 0.1 Area: (cm) 0.785 Volume: (cm) 0.079 % Reduction in Area: 78.2% % Reduction in Volume: 92.7% Epithelialization: None Tunneling: No Undermining: No Wound Description Classification: Grade 1 Wound Margin: Distinct, outline attached Exudate Amount: Large Exudate Type: Serous Exudate Color: amber Foul Odor After Cleansing: No Slough/Fibrino Yes Wound Bed Granulation Amount: Large (67-100%) Granulation Quality: Red Necrotic Amount: Small (1-33%) Necrotic Quality: Adherent Slough Periwound Skin Texture Texture Color No Abnormalities Noted: No No Abnormalities Noted: No Moisture Temperature / Pain No Abnormalities Noted: No Temperature: No Abnormality Maceration: Yes Tenderness on Palpation: Yes Beauchamp, Sharia E. ([id number]) Wound Preparation Ulcer Cleansing: Rinsed/Irrigated with Saline Topical Anesthetic Applied: Other: lidocaine 4%, Treatment Notes Wound #1 (Right Knee) 1.  Cleansed with: Clean wound with Normal Saline 2. Anesthetic Topical Lidocaine 4% cream to wound bed prior to debridement 5. Secondary Dressing Applied ABD Pad Non-Adherent pad 7. Secured with Tape Notes netting Electronic Signature(s) Signed: 11/12/2016 5:02:56 PM By: Montey Hora Entered By: Montey Hora on 11/12/2016 12:52:24 Alexandria [name] ([id number]) -------------------------------------------------------------------------------- Wound Assessment Details Patient Name: Alexandria [name] Date of Service: 11/12/2016 12:30 PM Medical Record Number: [id number] Patient Account Number: [id number] Date of  Birth/Sex: 02-03-1936 (81 y.o. Female) Treating RN: Montey Hora Primary Care Kishon Garriga: Elsie Stain Other Clinician: Referring Elinor Kleine: Elsie Stain Treating Addilynn Mowrer/Extender: Frann Rider in Treatment: 2 Wound Status Wound Number: 2 Primary Diabetic Wound/Ulcer of the Lower Etiology: Extremity Wound Location: Right Lower Leg - Lateral Secondary Trauma, Other Wounding Event: Trauma Etiology: Date Acquired: 11/06/2016 Wound Open Weeks Of Treatment: 0 Status: Clustered Wound: No Comorbid Anemia, Chronic Obstructive History: Pulmonary Disease (COPD), Sleep Apnea, Congestive Heart Failure, Hypertension, Type II Diabetes, Neuropathy Wound Measurements Length: (cm) 1.4 Width: (cm) 1.8 Depth: (cm) 0.1 Area: (cm) 1.979 Volume: (cm) 0.198 % Reduction in Area: % Reduction in Volume: Epithelialization: None Tunneling: No Undermining: No Wound Description Classification: Grade 1 Wound Margin: Flat and Intact Exudate Amount: Large Exudate Type: Serous Exudate Color: amber Foul Odor After Cleansing: No Slough/Fibrino Yes Wound Bed Granulation Amount: None Present (0%) Exposed Structure Necrotic Amount: Large (67-100%) Fascia Exposed: No Necrotic Quality: Eschar, Adherent Slough Fat Layer (Subcutaneous Tissue) Exposed: No Tendon Exposed:  No Muscle Exposed: No Joint Exposed: No Bone Exposed: No Periwound Skin Texture Texture Color No Abnormalities Noted: No No Abnormalities Noted: No Ausmus, Corrissa E. ([id number]) Callus: No Atrophie Blanche: No Crepitus: No Cyanosis: No Excoriation: No Ecchymosis: No Induration: No Erythema: No Rash: No Hemosiderin Staining: No Scarring: No Mottled: No Pallor: No Moisture Rubor: No No Abnormalities Noted: No Dry / Scaly: No Temperature / Pain Maceration: No Temperature: No Abnormality Wound Preparation Ulcer Cleansing: Rinsed/Irrigated with Saline Topical Anesthetic Applied: Other: lidocaine 4%, Treatment Notes Wound #2 (Right, Lateral Lower Leg) 1. Cleansed with: Clean wound with Normal Saline 2. Anesthetic Topical Lidocaine 4% cream to wound bed prior to debridement 5. Secondary Dressing Applied ABD Pad Non-Adherent pad 7. Secured with Tape Notes netting Electronic Signature(s) Signed: 11/12/2016 5:02:56 PM By: Montey Hora Entered By: Montey Hora on 11/12/2016 12:50:17 Alexandria [name] ([id number]) -------------------------------------------------------------------------------- [place] Details Patient Name: Alexandria [name] Date of Service: 11/12/2016 12:30 PM Medical Record Number: [id number] Patient Account Number: [id number] Date of Birth/Sex: 1935/09/10 (81 y.o. Female) Treating RN: Montey Hora Primary Care Ariea Rochin: Elsie Stain Other Clinician: Referring Ronnett Pullin: Elsie Stain Treating Kameah Rawl/Extender: Frann Rider in Treatment: 2 Vital Signs Time Taken: 12:43 Temperature (F): 97.9 Height (in): 65 Pulse (bpm): 75 Weight (lbs): 219 Respiratory Rate (breaths/min): 20 Body Mass Index (BMI): 36.4 Blood Pressure (mmHg): 145/69 Reference Range: 80 - 120 mg / dl Electronic Signature(s) Signed: 11/12/2016 5:02:56 PM By: Montey Hora Entered By: Montey Hora on 11/12/2016 12:44:05

## 2016-11-18 ENCOUNTER — Encounter: Payer: BLUE CROSS/BLUE SHIELD | Admitting: Surgery

## 2016-11-18 DIAGNOSIS — I890 Lymphedema, not elsewhere classified: Secondary | ICD-10-CM | POA: Diagnosis not present

## 2016-11-18 DIAGNOSIS — S81811A Laceration without foreign body, right lower leg, initial encounter: Secondary | ICD-10-CM | POA: Diagnosis not present

## 2016-11-18 DIAGNOSIS — S81011A Laceration without foreign body, right knee, initial encounter: Secondary | ICD-10-CM | POA: Diagnosis not present

## 2016-11-18 DIAGNOSIS — E11622 Type 2 diabetes mellitus with other skin ulcer: Secondary | ICD-10-CM | POA: Diagnosis not present

## 2016-11-18 NOTE — Telephone Encounter (Signed)
Have you received this form yet?  I will call to ask for it again if not.

## 2016-11-18 NOTE — Telephone Encounter (Signed)
I haven't seen it.  Thanks.

## 2016-11-19 ENCOUNTER — Telehealth: Payer: Self-pay | Admitting: Family Medicine

## 2016-11-19 NOTE — Telephone Encounter (Signed)
Caller Name:Connie  Relationship to Patient:[facility]  Best number:[phone] Pharmacy:  Reason for call:  Request verbal orders for: Occupational therapy 2 week for 3 weeks

## 2016-11-20 NOTE — Progress Notes (Signed)
JOURNIEE, FELDKAMP ([id number]) Visit Report for 11/18/2016 Chief Complaint Document Details Patient Name: Alexandria [name], Alexandria [name] Date of Service: 11/18/2016 2:30 PM Medical Record Number: [id number] Patient Account Number: [id number] Date of Birth/Sex: 1935-04-11 (81 y.o. Female) Treating RN: Montey Hora Primary Care Provider: Elsie Stain Other Clinician: Referring Provider: Elsie Stain Treating Provider/Extender: Frann Rider in Treatment: 3 Information Obtained from: Patient Chief Complaint Patient presents to the wound care center for a consult due non healing wound to the right knee and swelling of the right lower extremity worse than the left for about 3 weeks now Electronic Signature(s) Signed: 11/18/2016 5:02:50 PM By: Christin Fudge MD, FACS Entered By: Christin Fudge on 11/18/2016 14:51:41 Alexandria [name] ([id number]) -------------------------------------------------------------------------------- HPI Details Patient Name: Alexandria [name] Date of Service: 11/18/2016 2:30 PM Medical Record Number: [id number] Patient Account Number: [id number] Date of Birth/Sex: 1935-05-12 (81 y.o. Female) Treating RN: Montey Hora Primary Care Provider: Elsie Stain Other Clinician: Referring Provider: Elsie Stain Treating Provider/Extender: Frann Rider in Treatment: 3 History of Present Illness HPI Description: 81 year old diabetic patient who recently was seen in the ER for a knee laceration which was closed with staples and then Steri-Stripped. She then developed a cellulitis on her right lower extremity and had to be treated with Septra and was previously on Keflex. She was asked to use a leg immobilizer and limited range of motion to 45o at the knee. The patient is known to have a past medical history of depression, hypothyroidism, hypertension, diastolic CHF, hyperlipidemia, diabetes mellitus, peripheral neuropathy. she is being treated by her pulmonologist  for COPD with emphysema with excarcebation and was given a course of prednisone recently. She also was asked to use a CPAP machine and other symptomatic treatment was given. Last hemoglobin A1c was 5.8% 2 months ago. she used to be a heavy smoker but has quit about 11 years ago. 11/04/2016 -- the patient had a lower extremity venous duplex reflux evaluation done on 11/03/2016 and this showed no evidence of acute bilateral lower extremity deep vein thrombosis. She had reflux in bilateral common femoral veins and there was reflux in the bilateral saphenofemoral junction. However they were unable to obtain reflux in the bilateral great saphenous veins of the small saphenous veins and it was a difficult examination technically. [place] wanted some advice about wound management while they were to be 11/12/2016 -- she returns after a vacation but has a new injury to her right lateral calf which she lacerated on a blunt object Electronic Signature(s) Signed: 11/18/2016 5:02:50 PM By: Christin Fudge MD, FACS Entered By: Christin Fudge on 11/18/2016 14:51:49 Alexandria [name] ([id number]) -------------------------------------------------------------------------------- Physical Exam Details Patient Name: Alexandria [name] Date of Service: 11/18/2016 2:30 PM Medical Record Number: [id number] Patient Account Number: [id number] Date of Birth/Sex: [birth year] (81 y.o. Female) Treating RN: Montey Hora Primary Care Provider: Elsie Stain Other Clinician: Referring Provider: Elsie Stain Treating Provider/Extender: Frann Rider in Treatment: 3 Constitutional . Pulse regular. Respirations normal and unlabored. Afebrile. . Eyes Nonicteric. Reactive to light. Ears, Nose, Mouth, and Throat Lips, teeth, and gums WNL.Marland Kitchen Moist mucosa without lesions. Neck supple and nontender. No palpable supraclavicular or cervical adenopathy. Normal sized without  goiter. Respiratory WNL. No retractions.. Cardiovascular Pedal Pulses WNL. No clubbing, cyanosis or edema. Lymphatic No adneopathy. No adenopathy. No adenopathy. Musculoskeletal Adexa without tenderness or enlargement.. Digits and nails w/o clubbing, cyanosis, infection, petechiae, ischemia, or inflammatory conditions.. Integumentary (Hair, Skin)  No suspicious lesions. No crepitus or fluctuance. No peri-wound warmth or erythema. No masses.Marland Kitchen Psychiatric Judgement and insight Intact.. No evidence of depression, anxiety, or agitation.. Notes the wound on the right knee has completely healed but she has a lot of lymphedema on the entire lower extremity. The wound on the lateral part of the calf continues to have healthy granulation tissue. Electronic Signature(s) Signed: 11/18/2016 5:02:50 PM By: Christin Fudge MD, FACS Entered By: Christin Fudge on 11/18/2016 14:52:25 Alexandria [name] ([id number]) -------------------------------------------------------------------------------- Physician Orders Details Patient Name: Alexandria [name] Date of Service: 11/18/2016 2:30 PM Medical Record Number: [id number] Patient Account Number: [id number] Date of Birth/Sex: 18-May-1935 (81 y.o. Female) Treating RN: Montey Hora Primary Care Provider: Elsie Stain Other Clinician: Referring Provider: Elsie Stain Treating Provider/Extender: Frann Rider in Treatment: 3 Verbal / Phone Orders: No Diagnosis Coding Wound Cleansing Wound #2 Right,Lateral Lower Leg o Clean wound with Normal Saline. Anesthetic Wound #2 Right,Lateral Lower Leg o Topical Lidocaine 4% cream applied to wound bed prior to debridement Primary Wound Dressing Wound #2 Right,Lateral Lower Leg o Prisma Ag Secondary Dressing Wound #2 Right,Lateral Lower Leg o Gauze and Kerlix/Conform Dressing Change Frequency Wound #2 Right,Lateral Lower Leg o Change dressing every day. Follow-up Appointments Wound #2  Right,Lateral Lower Leg o Return Appointment in 1 week. Edema Control Wound #2 Right,Lateral Lower Leg o Patient to wear own compression stockings o Elevate legs to the level of the heart and pump ankles as often as possible Additional Orders / Instructions Wound #2 Right,Lateral Lower Leg o Increase protein intake. Medications-please add to medication list. RENLEIGH, OUELLET ([id number]) Wound #2 Right,Lateral Lower Leg o Other: - Vitamin C, Zinc, Multivitamin Electronic Signature(s) Signed: 11/18/2016 5:02:50 PM By: Christin Fudge MD, FACS Signed: 11/18/2016 5:58:36 PM By: Montey Hora Entered By: Montey Hora on 11/18/2016 14:44:04 Alexandria [name] ([id number]) -------------------------------------------------------------------------------- Problem List Details Patient Name: Alexandria [name] Date of Service: 11/18/2016 2:30 PM Medical Record Number: [id number] Patient Account Number: [id number] Date of Birth/Sex: Oct 02, 1935 (81 y.o. 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 Female) Treating RN: Montey Hora Primary Care Provider: Elsie Stain Other Clinician: Referring Provider: Elsie Stain Treating Provider/Extender: Frann Rider in Treatment: 3 Subjective Chief Complaint Information obtained from Patient Patient presents to the wound care center for a consult due non healing wound to the right knee and swelling of the right lower extremity worse than the left for about 3 weeks now History of Present Illness (HPI) 81 year old diabetic patient who recently was seen in the ER for a knee laceration which was closed with staples and then Steri-Stripped. She then developed a cellulitis on her right lower extremity and had to be treated with  Septra and was previously on Keflex. She was asked to use a leg immobilizer and limited range of motion to 45 at the knee. The patient is known to have a past medical history of depression, hypothyroidism, hypertension, diastolic CHF, hyperlipidemia, diabetes mellitus, peripheral neuropathy. she is being treated by her pulmonologist for COPD with emphysema with excarcebation and was given a course of prednisone recently. She also was asked to use a CPAP machine and other symptomatic treatment was given. Last hemoglobin A1c was 5.8% 2 months ago. she used to be a heavy smoker but has quit about 11 years ago. 11/04/2016 -- the patient had a lower extremity venous duplex reflux evaluation done on 11/03/2016 and this showed no evidence of acute bilateral lower extremity deep vein thrombosis. She had reflux in bilateral common femoral veins and there was reflux in the bilateral saphenofemoral junction. However they were unable  to obtain reflux in the bilateral great saphenous veins of the small saphenous veins and it was a difficult examination technically. [place] and wanted some advice about wound management while they were to be 11/12/2016 -- she returns after a vacation but has a new injury to her right lateral calf which she lacerated on a blunt object Objective Wichmann, Arriyana E. ([id number]) Constitutional Pulse regular. Respirations normal and unlabored. Afebrile. Vitals Time Taken: 2:28 PM, Height: 65 in, Weight: 219 lbs, BMI: 36.4, Temperature: 97.7 F, Pulse: 79 bpm, Respiratory Rate: 20 breaths/min, Blood Pressure: 113/52 mmHg. Eyes Nonicteric. Reactive to light. Ears, Nose, Mouth, and Throat Lips, teeth, and gums WNL.Marland Kitchen Moist mucosa without lesions. Neck supple and nontender. No palpable supraclavicular or cervical adenopathy. Normal sized without goiter. Respiratory WNL. No retractions.. Cardiovascular Pedal Pulses WNL. No clubbing, cyanosis or edema. Lymphatic No adneopathy. No adenopathy. No adenopathy. Musculoskeletal Adexa without tenderness or enlargement.. Digits and nails w/o clubbing, cyanosis, infection, petechiae, ischemia, or inflammatory conditions.Marland Kitchen Psychiatric Judgement and insight Intact.. No evidence of depression, anxiety, or agitation.. General Notes: the wound on the right knee has completely healed but she has a lot of lymphedema on the entire lower extremity. The wound on the lateral part of the calf continues to have healthy granulation tissue. Integumentary (Hair, Skin) No suspicious lesions. No crepitus or fluctuance. No peri-wound warmth or erythema. No masses.. Wound #1 status is Healed - Epithelialized. Original cause of wound was Trauma. The wound is located on the Right Knee. The wound measures 0cm length x 0cm width x 0cm depth; 0cm^2 area and 0cm^3 volume. Wound #2 status is Open. Original cause of wound was Trauma. The  wound is located on the Right,Lateral Lower Leg. The wound measures 1cm length x 1.4cm width x 0.1cm depth; 1.1cm^2 area and 0.11cm^3 volume. There is no tunneling or undermining noted. There is a large amount of serous drainage noted. The wound margin is flat and intact. There is medium (34-66%) red granulation within the wound bed. There is a medium (34-66%) amount of necrotic tissue within the wound bed including Adherent Slough. The periwound skin appearance did not exhibit: Callus, Crepitus, Excoriation, Induration, Rash, Scarring, Dry/Scaly, Maceration, Atrophie Blanche, Cyanosis, Ecchymosis, Hemosiderin Staining, Mottled, Pallor, Karaffa, Kelis E. ([id number]) Rubor, Erythema. Periwound temperature was noted as No Abnormality. Assessment Active Problems ICD-10 E11.622 - Type 2 diabetes mellitus with other skin ulcer S81.001A - Unspecified open wound, right knee, initial encounter I89.0 - Lymphedema, not elsewhere classified I87.331 - Chronic venous hypertension (idiopathic) with ulcer and inflammation of right lower  extremity L97.212 - Non-pressure chronic ulcer of right calf with fat layer exposed Plan Wound Cleansing: Wound #2 Right,Lateral Lower Leg: Clean wound with Normal Saline. Anesthetic: Wound #2 Right,Lateral Lower Leg: Topical Lidocaine 4% cream applied to wound bed prior to debridement Primary Wound Dressing: Wound #2 Right,Lateral Lower Leg: Prisma Ag Secondary Dressing: Wound #2 Right,Lateral Lower Leg: Gauze and Kerlix/Conform Dressing Change Frequency: Wound #2 Right,Lateral Lower Leg: Change dressing every day. Follow-up Appointments: Wound #2 Right,Lateral Lower Leg: Return Appointment in 1 week. Edema Control: Wound #2 Right,Lateral Lower Leg: Patient to wear own compression stockings Elevate legs to the level of the heart and pump ankles as often as possible Additional Orders / Instructions: Wound #2 Right,Lateral Lower Leg: Increase protein  intake. Medications-please add to medication list.: Wound #2 Right,Lateral Lower Leg: ETOLA, MULL ([id number]) Other: - Vitamin C, Zinc, Multivitamin After review of her wound today, and discussing with the patient, I have recommended: 1. telfa and gauze to the wound, with an ABD and change this dressing every other day 2. Prisma AG and Kerlix gauze to the new wound on the right lateral calf, to be changed every other day 3. Elevation and exercise has been discussed in great detail 4. talk to her PCP regarding her diuretic 5. Good control of her diabetes mellitus 6. Regular visits to the wound center Electronic Signature(s) Signed: 11/18/2016 5:02:50 PM By: Christin Fudge MD, FACS Entered By: Christin Fudge on 11/18/2016 14:53:39 Alexandria [name] ([id number]) -------------------------------------------------------------------------------- SuperBill Details Patient Name: Alexandria [name] Date of Service: 11/18/2016 Medical Record Number: [id number] Patient Account Number: [id number] Date of Birth/Sex: 08-06-1935 (81 y.o. Female) Treating RN: Montey Hora Primary Care Provider: Elsie Stain Other Clinician: Referring Provider: Elsie Stain Treating Provider/Extender: Frann Rider in Treatment: 3 Diagnosis Coding ICD-10 Codes Code Description E11.622 Type 2 diabetes mellitus with other skin ulcer S81.001A Unspecified open wound, right knee, initial encounter I89.0 Lymphedema, not elsewhere classified Chronic venous hypertension (idiopathic) with ulcer and inflammation of right lower I87.331 extremity L97.212 Non-pressure chronic ulcer of right calf with fat layer exposed Facility Procedures CPT4 Code: [id number] Description: [place] VISIT-LEV 3 EST PT Modifier: Quantity: 1 Physician Procedures CPT4: Description Modifier Quantity Code [id number] - WC PHYS LEVEL 3 - EST PT 1 ICD-10 Description Diagnosis E11.622 Type 2 diabetes mellitus with other skin  ulcer S81.001A Unspecified open wound, right knee, initial encounter I89.0 Lymphedema, not  elsewhere classified I87.331 Chronic venous hypertension (idiopathic) with ulcer and inflammation of right lower extremity Electronic Signature(s) Signed: 11/18/2016 5:02:50 PM By: Christin Fudge MD, FACS Signed: 11/18/2016 5:58:36 PM By: Montey Hora Entered By: Montey Hora on 11/18/2016 15:08:36

## 2016-11-21 NOTE — Telephone Encounter (Signed)
Please give the order.  Thanks.   

## 2016-11-22 NOTE — Progress Notes (Signed)
HILDE, CHURCHMAN ([id number]) Visit Report for 11/18/2016 Arrival Information Details Patient Name: Alexandria [name], Alexandria [name] Date of Service: 11/18/2016 2:30 PM Medical Record Number: [id number] Patient Account Number: [id number] Date of Birth/Sex: 01-22-1936 (81 y.o. Female) Treating RN: Montey Hora Primary Care Francesca Strome: Elsie Stain Other Clinician: Referring Samar Venneman: Elsie Stain Treating Charita Lindenberger/Extender: Frann Rider in Treatment: 3 Visit Information History Since Last Visit Added or deleted any medications: No Patient Arrived: Wheel Chair Any new allergies or adverse reactions: No Arrival Time: 14:27 Had a fall or experienced change in No activities of daily living that may affect Accompanied By: self risk of falls: Transfer Assistance: None Signs or symptoms of abuse/neglect since last No Patient Identification Verified: Yes visito Secondary Verification Process Yes Has Dressing in Place as Prescribed: Yes Completed: Pain Present Now: No Patient Requires Transmission-Based No Precautions: Patient Has Alerts: Yes Patient Alerts: DM II Electronic Signature(s) Signed: 11/18/2016 5:58:36 PM By: Montey Hora Entered By: Montey Hora on 11/18/2016 14:27:34 Alexandria [name] ([id number]) -------------------------------------------------------------------------------- Clinic Level of Care Assessment Details Patient Name: Alexandria [name] Date of Service: 11/18/2016 2:30 PM Medical Record Number: [id number] Patient Account Number: [id number] Date of Birth/Sex: 09/09/1935 (81 y.o. Female) Treating RN: Montey Hora Primary Care Kaytlynn Kochan: Elsie Stain Other Clinician: Referring Ahna Konkle: Elsie Stain Treating Kischa Altice/Extender: Frann Rider in Treatment: 3 Clinic Level of Care Assessment Items TOOL 4 Quantity Score []  - Use when only an EandM is performed on FOLLOW-UP visit 0 ASSESSMENTS - Nursing Assessment / Reassessment X - Reassessment of  Co-morbidities (includes updates in patient status) 1 10 X - Reassessment of Adherence to Treatment Plan 1 5 ASSESSMENTS - Wound and Skin Assessment / Reassessment []  - Simple Wound Assessment / Reassessment - one wound 0 X - Complex Wound Assessment / Reassessment - multiple wounds 2 5 []  - Dermatologic / Skin Assessment (not related to wound area) 0 ASSESSMENTS - Focused Assessment []  - Circumferential Edema Measurements - multi extremities 0 []  - Nutritional Assessment / Counseling / Intervention 0 X - Lower Extremity Assessment (monofilament, tuning fork, pulses) 1 5 []  - Peripheral Arterial Disease Assessment (using hand held doppler) 0 ASSESSMENTS - Ostomy and/or Continence Assessment and Care []  - Incontinence Assessment and Management 0 []  - Ostomy Care Assessment and Management (repouching, etc.) 0 PROCESS - Coordination of Care X - Simple Patient / Family Education for ongoing care 1 15 []  - Complex (extensive) Patient / Family Education for ongoing care 0 []  - Staff obtains Programmer, systems, Records, Test Results / Process Orders 0 []  - Staff telephones HHA, Nursing Homes / Clarify orders / etc 0 []  - Routine Transfer to another Facility (non-emergent condition) 0 LAREEN, MULLINGS. ([id number]) []  - Routine Hospital Admission (non-emergent condition) 0 []  - New Admissions / Biomedical engineer / Ordering NPWT, Apligraf, etc. 0 []  - Emergency Hospital Admission (emergent condition) 0 X - Simple Discharge Coordination 1 10 []  - Complex (extensive) Discharge Coordination 0 PROCESS - Special Needs []  - Pediatric / Minor Patient Management 0 []  - Isolation Patient Management 0 []  - Hearing / Language / Visual special needs 0 []  - Assessment of Community assistance (transportation, D/C planning, etc.) 0 []  - Additional assistance / Altered mentation 0 []  - Support Surface(s) Assessment (bed, cushion, seat, etc.) 0 INTERVENTIONS - Wound Cleansing / Measurement []  - Simple Wound  Cleansing - one wound 0 X - Complex Wound Cleansing - multiple wounds 2 5 X - Wound Imaging (photographs - any number of wounds) 1 5 []  -  Wound Tracing (instead of photographs) 0 []  - Simple Wound Measurement - one wound 0 X - Complex Wound Measurement - multiple wounds 2 5 INTERVENTIONS - Wound Dressings X - Small Wound Dressing one or multiple wounds 1 10 []  - Medium Wound Dressing one or multiple wounds 0 []  - Large Wound Dressing one or multiple wounds 0 []  - Application of Medications - topical 0 []  - Application of Medications - injection 0 INTERVENTIONS - Miscellaneous []  - External ear exam 0 Kocak, Aliha E. ([id number]) []  - Specimen Collection (cultures, biopsies, blood, body fluids, etc.) 0 []  - Specimen(s) / Culture(s) sent or taken to Lab for analysis 0 []  - Patient Transfer (multiple staff / Harrel Lemon Lift / Similar devices) 0 []  - Simple Staple / Suture removal (25 or less) 0 []  - Complex Staple / Suture removal (26 or more) 0 []  - Hypo / Hyperglycemic Management ([name] monitor of Blood Glucose) 0 []  - Ankle / Brachial Index (ABI) - do not check if billed separately 0 X - Vital Signs 1 5 Has the patient been seen at the hospital within the last three years: Yes Total Score: 95 Level Of Care: New/Established - Level 3 Electronic Signature(s) Signed: 11/18/2016 5:58:36 PM By: Montey Hora Entered By: Montey Hora on 11/18/2016 15:08:26 Alexandria [name] ([id number]) -------------------------------------------------------------------------------- Encounter Discharge Information Details Patient Name: Alexandria [name] Date of Service: 11/18/2016 2:30 PM Medical Record Number: [id number] Patient Account Number: [id number] Date of Birth/Sex: 1935-06-06 (81 y.o. Female) Treating RN: Montey Hora Primary Care Vara Mairena: Elsie Stain Other Clinician: Referring Terry Abila: Elsie Stain Treating Francisca Langenderfer/Extender: Frann Rider in Treatment: 3 Encounter Discharge  Information Items Discharge Pain Level: 0 Discharge Condition: Stable Ambulatory Status: Wheelchair Discharge Destination: Home Transportation: Private Auto Accompanied By: self Schedule Follow-up Appointment: Yes Medication Reconciliation completed and provided to Patient/Care No Michaelann Gunnoe: Provided on Clinical Summary of Care: 11/18/2016 Form Type Recipient Paper Patient CG Electronic Signature(s) Signed: 11/22/2016 10:13:53 AM By: Ruthine Dose Entered By: Ruthine Dose on 11/18/2016 14:53:28 Alexandria [name] ([id number]) -------------------------------------------------------------------------------- Lower Extremity Assessment Details Patient Name: Alexandria [name] Date of Service: 11/18/2016 2:30 PM Medical Record Number: [id number] Patient Account Number: [id number] Date of Birth/Sex: 10-14-1935 (81 y.o. Female) Treating RN: Montey Hora Primary Care Danyell Shader: Elsie Stain Other Clinician: Referring Byanca Kasper: Elsie Stain Treating Nancey Kreitz/Extender: Frann Rider in Treatment: 3 Vascular Assessment Pulses: Dorsalis Pedis Palpable: [Right:Yes] Posterior Tibial Extremity colors, hair growth, and conditions: Extremity Color: [Right:Hyperpigmented] Hair Growth on Extremity: [Right:No] Temperature of Extremity: [Right:Warm] Capillary Refill: [Right:< 3 seconds] Electronic Signature(s) Signed: 11/18/2016 5:58:36 PM By: Montey Hora Entered By: Montey Hora on 11/18/2016 14:36:36 Alexandria [name] ([id number]) -------------------------------------------------------------------------------- Multi Wound Chart Details Patient Name: Alexandria [name] Date of Service: 11/18/2016 2:30 PM Medical Record Number: [id number] Patient Account Number: [id number] Date of Birth/Sex: 08/29/1935 (81 y.o. Female) Treating RN: Montey Hora Primary Care Dystany Duffy: Elsie Stain Other Clinician: Referring Shequilla Goodgame: Elsie Stain Treating Quinetta Shilling/Extender: Frann Rider in Treatment: 3 Vital Signs Height(in): 65 Pulse(bpm): 79 Weight(lbs): 219 Blood Pressure 113/52 (mmHg): Body Mass Index(BMI): 36 Temperature(F): 97.7 Respiratory Rate 20 (breaths/min): Photos: [1:No Photos] [2:No Photos] [N/A:N/A] Wound Location: [1:Right Knee] [2:Right Lower Leg - Lateral] [N/A:N/A] Wounding Event: [1:Trauma] [2:Trauma] [N/A:N/A] Primary Etiology: [1:Diabetic Wound/Ulcer of the Lower Extremity] [2:Diabetic Wound/Ulcer of the Lower Extremity] [N/A:N/A] Secondary Etiology: [1:Trauma, Other] [2:Trauma, Other] [N/A:N/A] Comorbid History: [1:N/A] [2:Anemia, Chronic Obstructive Pulmonary Disease (COPD), Sleep Apnea, Congestive Heart Failure, Hypertension, Type II Diabetes, Neuropathy] [N/A:N/A] Date Acquired: [1:10/01/2016] [2:11/06/2016] [N/A:N/A]  Weeks of Treatment: [1:3] [2:0] [N/A:N/A] Wound Status: [1:Healed - Epithelialized] [2:Open] [N/A:N/A] Measurements L x W x D 0x0x0 [2:1x1.4x0.1] [N/A:N/A] (cm) Area (cm) : [1:0] [2:1.1] [N/A:N/A] Volume (cm) : [1:0] [2:0.11] [N/A:N/A] % Reduction in Area: [1:100.00%] [2:44.40%] [N/A:N/A] % Reduction in Volume: 100.00% [2:44.40%] [N/A:N/A] Classification: [1:Grade 1] [2:Grade 1] [N/A:N/A] Exudate Amount: [1:N/A] [2:Large] [N/A:N/A] Exudate Type: [1:N/A] [2:Serous] [N/A:N/A] Exudate Color: [1:N/A] [2:amber] [N/A:N/A] Wound Margin: [1:N/A] [2:Flat and Intact] [N/A:N/A] Granulation Amount: [1:N/A] [2:Medium (34-66%)] [N/A:N/A] Granulation Quality: [1:N/A] [2:Red] [N/A:N/A] Necrotic Amount: [1:N/A] [2:Medium (34-66%)] [N/A:N/A] Epithelialization: N/A None N/A Periwound Skin Texture: No Abnormalities Noted Excoriation: No N/A Induration: No Callus: No Crepitus: No Rash: No Scarring: No Periwound Skin No Abnormalities Noted Maceration: No N/A Moisture: Dry/Scaly: No Periwound Skin Color: No Abnormalities Noted Atrophie Blanche: No N/A Cyanosis: No Ecchymosis: No Erythema: No Hemosiderin Staining:  No Mottled: No Pallor: No Rubor: No Temperature: N/A No Abnormality N/A Tenderness on No No N/A Palpation: Wound Preparation: N/A Ulcer Cleansing: N/A Rinsed/Irrigated with Saline Topical Anesthetic Applied: Other: lidocaine 4% Treatment Notes Electronic Signature(s) Signed: 11/18/2016 5:02:50 PM By: Christin Fudge MD, FACS Entered By: Christin Fudge on 11/18/2016 14:51:34 Alexandria [name] ([id number]) -------------------------------------------------------------------------------- [place] Details Patient Name: Alexandria [name] Date of Service: 11/18/2016 2:30 PM Medical Record Number: [id number] Patient Account Number: [id number] Date of Birth/Sex: 1935/09/03 (81 y.o. Female) Treating RN: Montey Hora Primary Care Kevron Patella: Elsie Stain Other Clinician: Referring Nazim Kadlec: Elsie Stain Treating Calem Cocozza/Extender: Frann Rider in Treatment: 3 Active Inactive ` Abuse / Safety / Falls / Self Care Management Nursing Diagnoses: History of Falls Potential for falls Goals: Patient will not experience any injury related to falls Date Initiated: 10/28/2016 Target Resolution Date: 01/15/2017 Goal Status: Active Interventions: Assess fall risk on admission and as needed Assess: immobility, friction, shearing, incontinence upon admission and as needed Notes: ` Nutrition Nursing Diagnoses: Imbalanced nutrition Impaired glucose control: actual or potential Potential for alteratiion in Nutrition/Potential for imbalanced nutrition Goals: Patient/caregiver agrees to and verbalizes understanding of need to use nutritional supplements and/or vitamins as prescribed Date Initiated: 10/28/2016 Target Resolution Date: 01/15/2017 Goal Status: Active Patient/caregiver will maintain therapeutic glucose control Date Initiated: 10/28/2016 Target Resolution Date: 02/12/2017 Goal Status: Active Interventions: Assess patient nutrition upon admission and as  needed per policy ZEEVA, COURSER ([id number]) Provide education on elevated blood sugars and impact on wound healing Notes: ` Orientation to the Wound Care Program Nursing Diagnoses: Knowledge deficit related to the wound healing center program Goals: Patient/caregiver will verbalize understanding of the [place] Date Initiated: 10/28/2016 Target Resolution Date: 11/13/2016 Goal Status: Active Interventions: Provide education on orientation to the wound center Notes: ` Pain, Acute or Chronic Nursing Diagnoses: Pain, acute or chronic: actual or potential Potential alteration in comfort, pain Goals: Patient/caregiver will verbalize adequate pain control between visits Date Initiated: 10/28/2016 Target Resolution Date: 02/12/2017 Goal Status: Active Interventions: Assess comfort goal upon admission Notes: ` Wound/Skin Impairment Nursing Diagnoses: Impaired tissue integrity Knowledge deficit related to ulceration/compromised skin integrity Goals: Ulcer/skin breakdown will have a volume reduction of 80% by week 12 Date Initiated: 10/28/2016 Target Resolution Date: 02/05/2017 Goal Status: Active VAIDA, KERCHNER ([id number]) Interventions: Assess patient/caregiver ability to perform ulcer/skin care regimen upon admission and as needed Assess ulceration(s) every visit Notes: Electronic Signature(s) Signed: 11/18/2016 5:58:36 PM By: Montey Hora Entered By: Montey Hora on 11/18/2016 14:41:36 Alexandria [name] ([id number]) -------------------------------------------------------------------------------- Pain Assessment Details Patient Name: Alexandria [name] Date of Service: 11/18/2016 2:30 PM Medical  Record Number: [id number] Patient Account Number: [id number] Date of Birth/Sex: 08/09/1935 (81 y.o. Female) Treating RN: Montey Hora Primary Care Brissia Delisa: Elsie Stain Other Clinician: Referring Siraj Dermody: Elsie Stain Treating Aman Batley/Extender:  Frann Rider in Treatment: 3 Active Problems Location of Pain Severity and Description of Pain Patient Has Paino No Site Locations Pain Management and Medication Current Pain Management: Electronic Signature(s) Signed: 11/18/2016 5:58:36 PM By: Montey Hora Entered By: Montey Hora on 11/18/2016 14:27:42 Alexandria [name] ([id number]) -------------------------------------------------------------------------------- Patient/Caregiver Education Details Patient Name: Alexandria [name] Date of Service: 11/18/2016 2:30 PM Medical Record Number: [id number] Patient Account Number: [id number] Date of Birth/Gender: Jun 03, 1935 (81 y.o. Female) Treating RN: Montey Hora Primary Care Physician: Elsie Stain Other Clinician: Referring Physician: Elsie Stain Treating Physician/Extender: Frann Rider in Treatment: 3 Education Assessment Education Provided To: Patient Education Topics Provided Wound/Skin Impairment: Handouts: Other: wound care and care of newly healed ulcer site Methods: Explain/Verbal Responses: State content correctly Electronic Signature(s) Signed: 11/18/2016 5:58:36 PM By: Montey Hora Entered By: Montey Hora on 11/18/2016 14:42:35 Alexandria [name] ([id number]) -------------------------------------------------------------------------------- Wound Assessment Details Patient Name: Alexandria [name] Date of Service: 11/18/2016 2:30 PM Medical Record Number: [id number] Patient Account Number: [id number] Date of Birth/Sex: 1936/03/01 (81 y.o. Female) Treating RN: Montey Hora Primary Care Fairley Copher: Elsie Stain Other Clinician: Referring Roland Lipke: Elsie Stain Treating Onedia Vargus/Extender: Frann Rider in Treatment: 3 Wound Status Wound Number: 1 Primary Etiology: Diabetic Wound/Ulcer of the Lower Extremity Wound Location: Right Knee Secondary Trauma, Other Wounding Event: Trauma Etiology: Date Acquired: 10/01/2016 Wound Status:  Healed - Epithelialized Weeks Of Treatment: 3 Clustered Wound: No Photos Photo Uploaded By: Gretta Cool, BSN, RN, CWS, Kim on 11/18/2016 17:44:04 Wound Measurements Length: (cm) 0 Width: (cm) 0 Depth: (cm) 0 Area: (cm) 0 Volume: (cm) 0 % Reduction in Area: 100% % Reduction in Volume: 100% Wound Description Classification: Grade 1 Periwound Skin Texture Texture Color No Abnormalities Noted: No No Abnormalities Noted: No Moisture No Abnormalities Noted: No Electronic Signature(s) Signed: 11/18/2016 5:58:36 PM By: Retta Mac ([id number]) Entered By: Montey Hora on 11/18/2016 14:41:23 Alexandria [name] ([id number]) -------------------------------------------------------------------------------- Wound Assessment Details Patient Name: Alexandria [name] Date of Service: 11/18/2016 2:30 PM Medical Record Number: [id number] Patient Account Number: [id number] Date of Birth/Sex: March 31, 1935 (81 y.o. Female) Treating RN: Montey Hora Primary Care Aveah Castell: Elsie Stain Other Clinician: Referring Molly Maselli: Elsie Stain Treating Charleene Callegari/Extender: Frann Rider in Treatment: 3 Wound Status Wound Number: 2 Primary Diabetic Wound/Ulcer of the Lower Etiology: Extremity Wound Location: Right Lower Leg - Lateral Secondary Trauma, Other Wounding Event: Trauma Etiology: Date Acquired: 11/06/2016 Wound Open Weeks Of Treatment: 0 Status: Clustered Wound: No Comorbid Anemia, Chronic Obstructive History: Pulmonary Disease (COPD), Sleep Apnea, Congestive Heart Failure, Hypertension, Type II Diabetes, Neuropathy Photos Photo Uploaded By: Gretta Cool, BSN, RN, CWS, Kim on 11/18/2016 17:44:04 Wound Measurements Length: (cm) 1 Width: (cm) 1.4 Depth: (cm) 0.1 Area: (cm) 1.1 Volume: (cm) 0.11 % Reduction in Area: 44.4% % Reduction in Volume: 44.4% Epithelialization: None Tunneling: No Undermining: No Wound Description Classification: Grade 1 Wound Margin:  Flat and Intact Exudate Amount: Large Exudate Type: Serous Exudate Color: amber Foul Odor After Cleansing: No Slough/Fibrino Yes Wound Bed BENTLY, MORATH E. ([id number]) Granulation Amount: Medium (34-66%) Exposed Structure Granulation Quality: Red Fascia Exposed: No Necrotic Amount: Medium (34-66%) Fat Layer (Subcutaneous Tissue) Exposed: No Necrotic Quality: Adherent Slough Tendon Exposed: No Muscle Exposed: No Joint Exposed: No Bone Exposed: No Periwound Skin Texture Texture Color No Abnormalities Noted: No No Abnormalities Noted:  No Callus: No Atrophie Blanche: No Crepitus: No Cyanosis: No Excoriation: No Ecchymosis: No Induration: No Erythema: No Rash: No Hemosiderin Staining: No Scarring: No Mottled: No Pallor: No Moisture Rubor: No No Abnormalities Noted: No Dry / Scaly: No Temperature / Pain Maceration: No Temperature: No Abnormality Wound Preparation Ulcer Cleansing: Rinsed/Irrigated with Saline Topical Anesthetic Applied: Other: lidocaine 4%, Treatment Notes Wound #2 (Right, Lateral Lower Leg) 1. Cleansed with: Clean wound with Normal Saline 2. Anesthetic Topical Lidocaine 4% cream to wound bed prior to debridement 4. Dressing Applied: Prisma Ag 5. Secondary Dressing Applied Dry Gauze Kerlix/Conform Non-Adherent pad 7. Secured with Tape Patient to wear own compression stockings Notes netting Electronic Signature(s) Signed: 11/18/2016 5:58:36 PM By: Simonne Come, Burnis Medin ([id number]) Entered By: Montey Hora on 11/18/2016 14:35:15 Alexandria [name] ([id number]) -------------------------------------------------------------------------------- [facility] Details Patient Name: Alexandria [name] Date of Service: 11/18/2016 2:30 PM Medical Record Number: [id number] Patient Account Number: [id number] Date of Birth/Sex: 1935-09-22 (81 y.o. Female) Treating RN: Montey Hora Primary Care Blu Lori: Elsie Stain Other Clinician: Referring  Kyros Salzwedel: Elsie Stain Treating Crews Mccollam/Extender: Frann Rider in Treatment: 3 Vital Signs Time Taken: 14:28 Temperature (F): 97.7 Height (in): 65 Pulse (bpm): 79 Weight (lbs): 219 Respiratory Rate (breaths/min): 20 Body Mass Index (BMI): 36.4 Blood Pressure (mmHg): 113/52 Reference Range: 80 - 120 mg / dl Electronic Signature(s) Signed: 11/18/2016 5:58:36 PM By: Montey Hora Entered By: Montey Hora on 11/18/2016 14:30:18

## 2016-11-22 NOTE — Telephone Encounter (Signed)
Faxed [employer], Larkin Ina, asking for form to be faxed to my fax number.

## 2016-11-22 NOTE — Telephone Encounter (Signed)
Left detailed message on voicemail of Alexandria [name] with [facility].

## 2016-11-23 NOTE — Telephone Encounter (Signed)
Received paperwork for diabetic shoes through a second request to [facility].  Placed in Dr. Josefine Class In [place].

## 2016-11-24 ENCOUNTER — Telehealth: Payer: Self-pay

## 2016-11-24 NOTE — Telephone Encounter (Signed)
Many thanks.   Rollene Fare- please send the form to endo.   Thanks.

## 2016-11-24 NOTE — Telephone Encounter (Signed)
Alexandria [name], I can fill out the form when she comes back - she is due for f/u - will do the foot exam at that time.  Alexandria [name], Can you please see if she can schedule a f/u appt within the next 2 weeks for the foot form and f/u for DM.  Thank you, C

## 2016-11-24 NOTE — Telephone Encounter (Signed)
Patient to be scheduled in two week for foot exam, will be on the look out for this paperwork.  Thanks!

## 2016-11-24 NOTE — Telephone Encounter (Signed)
She has been followed by endo re: DM2.  I don't know if the form has to come through them, since she is seen there for Dm2.    I routed this phone noted to endo.  If endo can complete the form, then I will route the hard copy to them.  If not, then I can fill it out, but she would have to have OV with DM2 foot exam done at the visit here.    Thanks.

## 2016-11-24 NOTE — Telephone Encounter (Signed)
Can we please get her scheduled within 2 weeks for a foot exam and the paperwork.   Please be on the lookout for that form and give it to me if it comes through up there.  Thanks!

## 2016-11-25 ENCOUNTER — Telehealth: Payer: Self-pay | Admitting: Cardiovascular Disease

## 2016-11-25 ENCOUNTER — Encounter: Payer: BLUE CROSS/BLUE SHIELD | Admitting: Surgery

## 2016-11-25 DIAGNOSIS — S81801A Unspecified open wound, right lower leg, initial encounter: Secondary | ICD-10-CM | POA: Diagnosis not present

## 2016-11-25 DIAGNOSIS — E11622 Type 2 diabetes mellitus with other skin ulcer: Secondary | ICD-10-CM | POA: Diagnosis not present

## 2016-11-25 NOTE — Telephone Encounter (Signed)
S/w patient. She fell on 10/01/16 and hard multiple stitches in her eye and knee. She's being treated by Dr Con Memos for wound care on her knee and leg; however, it is still swollen and red. The swelling and redness has gone down per Dr Con Memos but he wants the patient to consult with cardiology for additional treatment for the swelling. Patient has prn furosemide and KCL which she took 3 days straight last week and it did not help. Patient has not taken the furosemide in a week now. She last saw Dr Con Memos today who stressed to her to see cardiology. Advised patient to go ahead and take a furosemide and potassium today and I will route to Dr Rockey Situ for further advice and where we can add patient in for an appointment on his schedule. She verbalized understanding.

## 2016-11-25 NOTE — Telephone Encounter (Signed)
Pt calling stating she had an accident and had stiches put in She is seeing Dr Con Memos  He advised her to see [place] for in her leg she is having some fluid build up   Please advise on a sooner appt

## 2016-11-25 NOTE — Telephone Encounter (Signed)
Form faxed to Endocrinology, ATT:  Dr. Cruzita Lederer.

## 2016-11-26 NOTE — Telephone Encounter (Signed)
Pt is on waitlist

## 2016-11-26 NOTE — Progress Notes (Signed)
[name], [name] ([id number]) Visit Report for 11/25/2016 Chief Complaint Document Details Patient Name: Alexandria [name], Alexandria [name] Date of Service: 11/25/2016 2:30 PM Medical Record Number: [id number] Patient Account Number: [id number] Date of Birth/Sex: 1935-12-16 (81 y.o. Female) Treating RN: Montey Hora Primary Care Provider: Elsie Stain Other Clinician: Referring Provider: Elsie Stain Treating Provider/Extender: Frann Rider in Treatment: 4 Information Obtained from: Patient Chief Complaint Patient presents to the wound care center for a consult due non healing wound to the right knee and swelling of the right lower extremity worse than the left for about 3 weeks now Electronic Signature(s) Signed: 11/25/2016 3:52:20 PM By: Christin Fudge MD, FACS Entered By: Christin Fudge on 11/25/2016 14:43:42 Alexandria [name] ([id number]) -------------------------------------------------------------------------------- HPI Details Patient Name: Alexandria [name] Date of Service: 11/25/2016 2:30 PM Medical Record Number: [id number] Patient Account Number: [id number] Date of Birth/Sex: Feb 17, 1936 (81 y.o. Female) Treating RN: Montey Hora Primary Care Provider: Elsie Stain Other Clinician: Referring Provider: Elsie Stain Treating Provider/Extender: Frann Rider in Treatment: 4 History of Present Illness HPI Description: 81 year old diabetic patient who recently was seen in the ER for a knee laceration which was closed with staples and then Steri-Stripped. She then developed a cellulitis on her right lower extremity and had to be treated with Septra and was previously on Keflex. She was asked to use a leg immobilizer and limited range of motion to 45o at the knee. The patient is known to have a past medical history of depression, hypothyroidism, hypertension, diastolic CHF, hyperlipidemia, diabetes mellitus, peripheral neuropathy. she is being treated by her pulmonologist  for COPD with emphysema with excarcebation and was given a course of prednisone recently. She also was asked to use a CPAP machine and other symptomatic treatment was given. Last hemoglobin A1c was 5.8% 2 months ago. she used to be a heavy smoker but has quit about 11 years ago. 11/04/2016 -- the patient had a lower extremity venous duplex reflux evaluation done on 11/03/2016 and this showed no evidence of acute bilateral lower extremity deep vein thrombosis. She had reflux in bilateral common femoral veins and there was reflux in the bilateral saphenofemoral junction. However they were unable to obtain reflux in the bilateral great saphenous veins of the small saphenous veins and it was a difficult examination technically. [place] wanted some advice about wound management while they were to be 11/12/2016 -- she returns after a vacation but has a new injury to her right lateral calf which she lacerated on a blunt object 11/25/2016 -- the patient still has not gotten in touch with her cardiologist regarding her diuretics and she continues to have significant lymphedema right lower extremity was then left Electronic Signature(s) Signed: 11/25/2016 3:52:20 PM By: Christin Fudge MD, FACS Entered By: Christin Fudge on 11/25/2016 14:44:10 Alexandria [name] ([id number]) -------------------------------------------------------------------------------- Physical Exam Details Patient Name: Alexandria [name] Date of Service: 11/25/2016 2:30 PM Medical Record Number: [id number] Patient Account Number: [id number] Date of Birth/Sex: [birth year] (81 y.o. Female) Treating RN: Montey Hora Primary Care Provider: Elsie Stain Other Clinician: Referring Provider: Elsie Stain Treating Provider/Extender: Frann Rider in Treatment: 4 Constitutional . Pulse regular. Respirations normal and unlabored. Afebrile. . Eyes Nonicteric. Reactive to light. Ears,  Nose, Mouth, and Throat Lips, teeth, and gums WNL.Marland Kitchen Moist mucosa without lesions. Neck supple and nontender. No palpable supraclavicular or cervical adenopathy. Normal sized without goiter. Respiratory WNL. No retractions.. Cardiovascular Pedal Pulses WNL. No clubbing, cyanosis or  edema. Lymphatic No adneopathy. No adenopathy. No adenopathy. Musculoskeletal Adexa without tenderness or enlargement.. Digits and nails w/o clubbing, cyanosis, infection, petechiae, ischemia, or inflammatory conditions.. Integumentary (Hair, Skin) No suspicious lesions. No crepitus or fluctuance. No peri-wound warmth or erythema. No masses.Marland Kitchen Psychiatric Judgement and insight Intact.. No evidence of depression, anxiety, or agitation.. Notes the wound on the right knee continues to be healed but she has a lot of edema still persistent. The wound on the right lateral calf is superficial and healing well. Electronic Signature(s) Signed: 11/25/2016 3:52:20 PM By: Christin Fudge MD, FACS Entered By: Christin Fudge on 11/25/2016 14:45:22 Alexandria [name] ([id number]) -------------------------------------------------------------------------------- Physician Orders Details Patient Name: Alexandria [name] Date of Service: 11/25/2016 2:30 PM Medical Record Number: [id number] Patient Account Number: [id number] Date of Birth/Sex: 09/12/1935 (81 y.o. Female) Treating RN: Montey Hora Primary Care Provider: Elsie Stain Other Clinician: Referring Provider: Elsie Stain Treating Provider/Extender: Frann Rider in Treatment: 4 Verbal / Phone Orders: No Diagnosis Coding Wound Cleansing Wound #2 Right,Lateral Lower Leg o Clean wound with Normal Saline. Anesthetic Wound #2 Right,Lateral Lower Leg o Topical Lidocaine 4% cream applied to wound bed prior to debridement Primary Wound Dressing Wound #2 Right,Lateral Lower Leg o Prisma Ag Secondary Dressing Wound #2 Right,Lateral Lower Leg o Gauze and  Kerlix/Conform Dressing Change Frequency Wound #2 Right,Lateral Lower Leg o Change dressing every day. Follow-up Appointments Wound #2 Right,Lateral Lower Leg o Return Appointment in 1 week. Edema Control Wound #2 Right,Lateral Lower Leg o Patient to wear own compression stockings o Elevate legs to the level of the heart and pump ankles as often as possible Additional Orders / Instructions Wound #2 Right,Lateral Lower Leg o Increase protein intake. Medications-please add to medication list. Alexandria [name], Alexandria [name] ([id number]) Wound #2 Right,Lateral Lower Leg o Other: - Vitamin C, Zinc, Multivitamin Electronic Signature(s) Signed: 11/25/2016 3:52:20 PM By: Christin Fudge MD, FACS Entered By: Christin Fudge on 11/25/2016 14:45:35 Alexandria [name] ([id number]) -------------------------------------------------------------------------------- Problem List Details Patient Name: Alexandria [name] Date of Service: 11/25/2016 2:30 PM Medical Record Number: [id number] Patient Account Number: [id number] Date of Birth/Sex: Nov 13, 1935 (81 y.o. 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 Female) Treating RN: Montey Hora Primary Care Provider: Elsie Stain Other Clinician: Referring Provider: Elsie Stain Treating Provider/Extender: Frann Rider in Treatment: 4 Subjective Chief Complaint Information obtained from Patient Patient presents to the wound care center for a consult due non healing wound to the right knee and swelling of the right lower extremity worse than the left for about 3 weeks now History of Present Illness (HPI) 81 year old diabetic patient who recently was seen in the ER for a knee laceration which was closed with staples and then  Steri-Stripped. She then developed a cellulitis on her right lower extremity and had to be treated with Septra and was previously on Keflex. She was asked to use a leg immobilizer and limited range of motion to 45 at the knee. The patient is known to have a past medical history of depression, hypothyroidism, hypertension, diastolic CHF, hyperlipidemia, diabetes mellitus, peripheral neuropathy. she is being treated by her pulmonologist for COPD with emphysema with excarcebation and was given a course of prednisone recently. She also was asked to use a CPAP machine and other symptomatic treatment was given. Last hemoglobin A1c was 5.8% 2 months ago. she used to be a heavy smoker but has quit about 11 years ago. 11/04/2016 -- the patient had a lower extremity venous duplex reflux evaluation done on 11/03/2016 and this showed no evidence of acute bilateral lower extremity deep vein thrombosis. She had reflux in bilateral common femoral veins and there was reflux in the bilateral saphenofemoral junction. However they were unable  to obtain reflux in the bilateral great saphenous veins of the small saphenous veins and it was a difficult examination technically. [place] wanted some advice about wound management while they were to be 11/12/2016 -- she returns after a vacation but has a new injury to her right lateral calf which she lacerated on a blunt object 11/25/2016 -- the patient still has not gotten in touch with her cardiologist regarding her diuretics and she continues to have significant lymphedema right lower extremity was then left Alexandria [name], Alexandria E. ([id number]) Objective Constitutional Pulse regular. Respirations normal and unlabored. Afebrile. Vitals Time Taken: 2:27 PM, Height: 65 in, Weight: 219 lbs, BMI: 36.4, Temperature: 98.2 F, Pulse: 63 bpm, Respiratory Rate: 22 breaths/min, Blood Pressure: 119/66 mmHg. Eyes Nonicteric. Reactive to light. Ears, Nose, Mouth, and Throat Lips, teeth, and gums WNL.Marland Kitchen Moist mucosa without lesions. Neck supple and nontender. No palpable supraclavicular or cervical adenopathy. Normal sized without goiter. Respiratory WNL. No retractions.. Cardiovascular Pedal Pulses WNL. No clubbing, cyanosis or edema. Lymphatic No adneopathy. No adenopathy. No adenopathy. Musculoskeletal Adexa without tenderness or enlargement.. Digits and nails w/o clubbing, cyanosis, infection, petechiae, ischemia, or inflammatory conditions.Marland Kitchen Psychiatric Judgement and insight Intact.. No evidence of depression, anxiety, or agitation.. General Notes: the wound on the right knee continues to be healed but she has a lot of edema still persistent. The wound on the right lateral calf is superficial and healing well. Integumentary (Hair, Skin) No suspicious lesions. No crepitus or fluctuance. No peri-wound warmth or erythema. No masses.. Wound #2 status is Open. Original cause of wound was Trauma. The wound is located on the Right,Lateral Lower Leg. The  wound measures 0.9cm length x 1cm width x 0.1cm depth; 0.707cm^2 area and 0.071cm^3 volume. There is no tunneling or undermining noted. There is a large amount of serous drainage noted. The wound margin is flat and intact. There is large (67-100%) red granulation within the wound bed. There is a small (1-33%) amount of necrotic tissue within the wound bed including Adherent Slough. The periwound skin appearance did not exhibit: Callus, Crepitus, Excoriation, Induration, Rash, Scarring, Dry/Scaly, Maceration, Atrophie Blanche, Cyanosis, Ecchymosis, Hemosiderin Staining, Mottled, Pallor, Decamp, Cambri E. ([id number]) Rubor, Erythema. Periwound temperature was noted as No Abnormality. Assessment Active Problems ICD-10 E11.622 - Type 2 diabetes mellitus with other skin ulcer S81.001A - Unspecified open wound, right knee, initial encounter I89.0 - Lymphedema, not elsewhere classified I87.331 - Chronic venous hypertension (idiopathic) with ulcer and  inflammation of right lower extremity L97.212 - Non-pressure chronic ulcer of right calf with fat layer exposed Plan Wound Cleansing: Wound #2 Right,Lateral Lower Leg: Clean wound with Normal Saline. Anesthetic: Wound #2 Right,Lateral Lower Leg: Topical Lidocaine 4% cream applied to wound bed prior to debridement Primary Wound Dressing: Wound #2 Right,Lateral Lower Leg: Prisma Ag Secondary Dressing: Wound #2 Right,Lateral Lower Leg: Gauze and Kerlix/Conform Dressing Change Frequency: Wound #2 Right,Lateral Lower Leg: Change dressing every day. Follow-up Appointments: Wound #2 Right,Lateral Lower Leg: Return Appointment in 1 week. Edema Control: Wound #2 Right,Lateral Lower Leg: Patient to wear own compression stockings Elevate legs to the level of the heart and pump ankles as often as possible Additional Orders / Instructions: Wound #2 Right,Lateral Lower Leg: Increase protein intake. Medications-please add to medication list.: Wound  #2 Right,Lateral Lower Leg: Alexandria [name], Alexandria [name] ([id number]) Other: - Vitamin C, Zinc, Multivitamin After review of her wound today, and discussing with the patient, I have recommended: 1. telfa and gauze to the right knee wound, with an ABD and change this dressing every other day 2. Prisma AG and Kerlix gauze to the new wound on the right lateral calf, to be changed every other day 3. Elevation and exercise has been discussed in great detail 4. talk to her cardiologist regarding her diuretic 5. Good control of her diabetes mellitus 6. Regular visits to the wound center Electronic Signature(s) Signed: 11/25/2016 3:52:20 PM By: Christin Fudge MD, FACS Entered By: Christin Fudge on 11/25/2016 14:46:34 Alexandria [name] ([id number]) -------------------------------------------------------------------------------- SuperBill Details Patient Name: Alexandria [name] Date of Service: 11/25/2016 Medical Record Number: [id number] Patient Account Number: [id number] Date of Birth/Sex: 1935-07-28 (81 y.o. Female) Treating RN: Montey Hora Primary Care Provider: Elsie Stain Other Clinician: Referring Provider: Elsie Stain Treating Provider/Extender: Frann Rider in Treatment: 4 Diagnosis Coding ICD-10 Codes Code Description E11.622 Type 2 diabetes mellitus with other skin ulcer S81.001A Unspecified open wound, right knee, initial encounter I89.0 Lymphedema, not elsewhere classified Chronic venous hypertension (idiopathic) with ulcer and inflammation of right lower I87.331 extremity L97.212 Non-pressure chronic ulcer of right calf with fat layer exposed Facility Procedures CPT4 Code: [id number] Description: Miguel Barrera VISIT-LEV 3 EST PT Modifier: Quantity: 1 Physician Procedures CPT4: Description Modifier Quantity Code [id number] - WC PHYS LEVEL 3 - EST PT 1 ICD-10 Description Diagnosis E11.622 Type 2 diabetes mellitus with other skin ulcer I89.0 Lymphedema, not elsewhere  classified I87.331 Chronic venous hypertension  (idiopathic) with ulcer and inflammation of right lower extremity L97.212 Non-pressure chronic ulcer of right calf with fat layer exposed Electronic Signature(s) Signed: 11/25/2016 3:52:20 PM By: Christin Fudge MD, FACS Signed: 11/25/2016 4:38:09 PM By: Montey Hora Entered By: Montey Hora on 11/25/2016 14:57:13

## 2016-11-27 NOTE — Telephone Encounter (Signed)
Need lasix at least QOD with potassium Leg elevation when sitting Compression hose daily (or ace wraps) Avoid high fluid intake

## 2016-11-29 NOTE — Progress Notes (Signed)
Alexandria, [name] ([id number]) Visit Report for 11/25/2016 Arrival Information Details Patient Name: Alexandria [name], Alexandria [name] Date of Service: 11/25/2016 2:30 PM Medical Record Number: [id number] Patient Account Number: [id number] Date of Birth/Sex: Jun 24, 1935 (81 y.o. Female) Treating RN: Montey Hora Primary Care Damary Doland: Elsie Stain Other Clinician: Referring Hy Swiatek: Elsie Stain Treating Erica Richwine/Extender: Frann Rider in Treatment: 4 Visit Information History Since Last Visit Added or deleted any medications: No Patient Arrived: Walker Any new allergies or adverse reactions: No Arrival Time: 14:22 Had a fall or experienced change in No Accompanied By: self activities of daily living that may affect Transfer Assistance: None risk of falls: Patient Identification Verified: Yes Signs or symptoms of abuse/neglect since last No Secondary Verification Process Completed: Yes visito Patient Requires Transmission-Based No Hospitalized since last visit: No Precautions: Has Dressing in Place as Prescribed: Yes Patient Has Alerts: Yes Pain Present Now: No Patient Alerts: DM II Electronic Signature(s) Signed: 11/25/2016 4:38:09 PM By: Montey Hora Entered By: Montey Hora on 11/25/2016 14:23:11 Alexandria [name] ([id number]) -------------------------------------------------------------------------------- Clinic Level of Care Assessment Details Patient Name: Alexandria [name] Date of Service: 11/25/2016 2:30 PM Medical Record Number: [id number] Patient Account Number: [id number] Date of Birth/Sex: 1935-05-04 (81 y.o. Female) Treating RN: Montey Hora Primary Care Yunior Jain: Elsie Stain Other Clinician: Referring Bijou Easler: Elsie Stain Treating Bairon Klemann/Extender: Frann Rider in Treatment: 4 Clinic Level of Care Assessment Items TOOL 4 Quantity Score []  - Use when only an EandM is performed on FOLLOW-UP visit 0 ASSESSMENTS - Nursing Assessment /  Reassessment X - Reassessment of Co-morbidities (includes updates in patient status) 1 10 X - Reassessment of Adherence to Treatment Plan 1 5 ASSESSMENTS - Wound and Skin Assessment / Reassessment X - Simple Wound Assessment / Reassessment - one wound 1 5 []  - Complex Wound Assessment / Reassessment - multiple wounds 0 []  - Dermatologic / Skin Assessment (not related to wound area) 0 ASSESSMENTS - Focused Assessment []  - Circumferential Edema Measurements - multi extremities 0 []  - Nutritional Assessment / Counseling / Intervention 0 X - Lower Extremity Assessment (monofilament, tuning fork, pulses) 1 5 []  - Peripheral Arterial Disease Assessment (using hand held doppler) 0 ASSESSMENTS - Ostomy and/or Continence Assessment and Care []  - Incontinence Assessment and Management 0 []  - Ostomy Care Assessment and Management (repouching, etc.) 0 PROCESS - Coordination of Care X - Simple Patient / Family Education for ongoing care 1 15 []  - Complex (extensive) Patient / Family Education for ongoing care 0 []  - Staff obtains Programmer, systems, Records, Test Results / Process Orders 0 []  - Staff telephones HHA, Nursing Homes / Clarify orders / etc 0 []  - Routine Transfer to another Facility (non-emergent condition) 0 Yackel, Moon E. ([id number]) []  - Routine Hospital Admission (non-emergent condition) 0 []  - New Admissions / Biomedical engineer / Ordering NPWT, Apligraf, etc. 0 []  - Emergency Hospital Admission (emergent condition) 0 X - Simple Discharge Coordination 1 10 []  - Complex (extensive) Discharge Coordination 0 PROCESS - Special Needs []  - Pediatric / Minor Patient Management 0 []  - Isolation Patient Management 0 []  - Hearing / Language / Visual special needs 0 []  - Assessment of Community assistance (transportation, D/C planning, etc.) 0 []  - Additional assistance / Altered mentation 0 []  - Support Surface(s) Assessment (bed, cushion, seat, etc.) 0 INTERVENTIONS - Wound Cleansing /  Measurement X - Simple Wound Cleansing - one wound 1 5 []  - Complex Wound Cleansing - multiple wounds 0 X - Wound Imaging (photographs - any number of wounds)  1 5 []  - Wound Tracing (instead of photographs) 0 X - Simple Wound Measurement - one wound 1 5 []  - Complex Wound Measurement - multiple wounds 0 INTERVENTIONS - Wound Dressings X - Small Wound Dressing one or multiple wounds 1 10 []  - Medium Wound Dressing one or multiple wounds 0 []  - Large Wound Dressing one or multiple wounds 0 []  - Application of Medications - topical 0 []  - Application of Medications - injection 0 INTERVENTIONS - Miscellaneous []  - External ear exam 0 Mounts, Tsuruko E. ([id number]) []  - Specimen Collection (cultures, biopsies, blood, body fluids, etc.) 0 []  - Specimen(s) / Culture(s) sent or taken to Lab for analysis 0 []  - Patient Transfer (multiple staff / Harrel Lemon Lift / Similar devices) 0 []  - Simple Staple / Suture removal (25 or less) 0 []  - Complex Staple / Suture removal (26 or more) 0 []  - Hypo / Hyperglycemic Management ([name] monitor of Blood Glucose) 0 []  - Ankle / Brachial Index (ABI) - do not check if billed separately 0 X - Vital Signs 1 5 Has the patient been seen at the hospital within the last three years: Yes Total Score: 80 Level Of Care: New/Established - Level 3 Electronic Signature(s) Signed: 11/25/2016 4:38:09 PM By: Montey Hora Entered By: Montey Hora on 11/25/2016 14:57:06 Alexandria [name] ([id number]) -------------------------------------------------------------------------------- Encounter Discharge Information Details Patient Name: Alexandria [name] Date of Service: 11/25/2016 2:30 PM Medical Record Number: [id number] Patient Account Number: [id number] Date of Birth/Sex: [birth year] (81 y.o. Female) Treating RN: Montey Hora Primary Care Stephens Shreve: Elsie Stain Other Clinician: Referring Awilda Covin: Elsie Stain Treating Twilia Yaklin/Extender: Frann Rider in  Treatment: 4 Encounter Discharge Information Items Discharge Pain Level: 0 Discharge Condition: Stable Ambulatory Status: Walker Discharge Destination: Home Transportation: Private Auto Accompanied By: self Schedule Follow-up Appointment: Yes Medication Reconciliation completed No and provided to Patient/Care Klyde Banka: Provided on Clinical Summary of Care: 11/25/2016 Form Type Recipient Paper Patient CG Electronic Signature(s) Signed: 11/29/2016 9:26:52 AM By: Ruthine Dose Entered By: Ruthine Dose on 11/25/2016 14:54:37 Alexandria [name] ([id number]) -------------------------------------------------------------------------------- Lower Extremity Assessment Details Patient Name: Alexandria [name] Date of Service: 11/25/2016 2:30 PM Medical Record Number: [id number] Patient Account Number: [id number] Date of Birth/Sex: [birth year] (81 y.o. Female) Treating RN: Montey Hora Primary Care Andromeda Poppen: Elsie Stain Other Clinician: Referring Rashi Giuliani: Elsie Stain Treating Ariel Dimitri/Extender: Frann Rider in Treatment: 4 Vascular Assessment Pulses: Dorsalis Pedis Palpable: [Right:Yes] Posterior Tibial Extremity colors, hair growth, and conditions: Extremity Color: [Right:Hyperpigmented] Hair Growth on Extremity: [Right:No] Temperature of Extremity: [Right:Warm] Capillary Refill: [Right:< 3 seconds] Electronic Signature(s) Signed: 11/25/2016 4:38:09 PM By: Montey Hora Entered By: Montey Hora on 11/25/2016 14:32:30 Alexandria [name] ([id number]) -------------------------------------------------------------------------------- Multi Wound Chart Details Patient Name: Alexandria [name] Date of Service: 11/25/2016 2:30 PM Medical Record Number: [id number] Patient Account Number: [id number] Date of Birth/Sex: 1936/02/12 (81 y.o. Female) Treating RN: Montey Hora Primary Care Ahni Bradwell: Elsie Stain Other Clinician: Referring Gavinn Collard: Elsie Stain Treating  Emiah Pellicano/Extender: Frann Rider in Treatment: 4 Vital Signs Height(in): 65 Pulse(bpm): 63 Weight(lbs): 219 Blood Pressure 119/66 (mmHg): Body Mass Index(BMI): 36 Temperature(F): 98.2 Respiratory Rate 22 (breaths/min): Photos: [2:No Photos] [N/A:N/A] Wound Location: [2:Right Lower Leg - Lateral] [N/A:N/A] Wounding Event: [2:Trauma] [N/A:N/A] Primary Etiology: [2:Diabetic Wound/Ulcer of the Lower Extremity] [N/A:N/A] Secondary Etiology: [2:Trauma, Other] [N/A:N/A] Comorbid History: [2:Anemia, Chronic Obstructive Pulmonary Disease (COPD), Sleep Apnea, Congestive Heart Failure, Hypertension, Type II Diabetes, Neuropathy] [N/A:N/A] Date Acquired: [2:11/06/2016] [N/A:N/A] Weeks of Treatment: [2:1] [N/A:N/A] Wound Status: [2:Open] [N/A:N/A] Measurements L  x W x D 0.9x1x0.1 [N/A:N/A] (cm) Area (cm) : [2:0.707] [N/A:N/A] Volume (cm) : [2:0.071] [N/A:N/A] % Reduction in Area: [2:64.30%] [N/A:N/A] % Reduction in Volume: 64.10% [N/A:N/A] Classification: [2:Grade 1] [N/A:N/A] Exudate Amount: [2:Large] [N/A:N/A] Exudate Type: [2:Serous] [N/A:N/A] Exudate Color: [2:amber] [N/A:N/A] Wound Margin: [2:Flat and Intact] [N/A:N/A] Granulation Amount: [2:Large (67-100%)] [N/A:N/A] Granulation Quality: [2:Red] [N/A:N/A] Necrotic Amount: [2:Small (1-33%)] [N/A:N/A] Exposed Structures: Fascia: No N/A N/A Fat Layer (Subcutaneous Tissue) Exposed: No Tendon: No Muscle: No Joint: No Bone: No Epithelialization: None N/A N/A Periwound Skin Texture: Excoriation: No N/A N/A Induration: No Callus: No Crepitus: No Rash: No Scarring: No Periwound Skin Maceration: No N/A N/A Moisture: Dry/Scaly: No Periwound Skin Color: Atrophie Blanche: No N/A N/A Cyanosis: No Ecchymosis: No Erythema: No Hemosiderin Staining: No Mottled: No Pallor: No Rubor: No Temperature: No Abnormality N/A N/A Tenderness on No N/A N/A Palpation: Wound Preparation: Ulcer Cleansing: N/A N/A Rinsed/Irrigated  with Saline Topical Anesthetic Applied: Other: lidocaine 4% Treatment Notes Electronic Signature(s) Signed: 11/25/2016 3:52:20 PM By: Christin Fudge MD, FACS Entered By: Christin Fudge on 11/25/2016 14:43:18 Alexandria [name] ([id number]) -------------------------------------------------------------------------------- [place] Details Patient Name: Alexandria [name] Date of Service: 11/25/2016 2:30 PM Medical Record Number: [id number] Patient Account Number: [id number] Date of Birth/Sex: 1936/02/12 (81 y.o. Female) Treating RN: Montey Hora Primary Care Anandi Abramo: Elsie Stain Other Clinician: Referring Quill Grinder: Elsie Stain Treating Nadege Carriger/Extender: Frann Rider in Treatment: 4 Active Inactive ` Abuse / Safety / Falls / Self Care Management Nursing Diagnoses: History of Falls Potential for falls Goals: Patient will not experience any injury related to falls Date Initiated: 10/28/2016 Target Resolution Date: 01/15/2017 Goal Status: Active Interventions: Assess fall risk on admission and as needed Assess: immobility, friction, shearing, incontinence upon admission and as needed Notes: ` Nutrition Nursing Diagnoses: Imbalanced nutrition Impaired glucose control: actual or potential Potential for alteratiion in Nutrition/Potential for imbalanced nutrition Goals: Patient/caregiver agrees to and verbalizes understanding of need to use nutritional supplements and/or vitamins as prescribed Date Initiated: 10/28/2016 Target Resolution Date: 01/15/2017 Goal Status: Active Patient/caregiver will maintain therapeutic glucose control Date Initiated: 10/28/2016 Target Resolution Date: 02/12/2017 Goal Status: Active Interventions: Assess patient nutrition upon admission and as needed per policy JAYLENE, ARROWOOD ([id number]) Provide education on elevated blood sugars and impact on wound healing Notes: ` Orientation to the Wound Care Program Nursing  Diagnoses: Knowledge deficit related to the wound healing center program Goals: Patient/caregiver will verbalize understanding of the [place] Date Initiated: 10/28/2016 Target Resolution Date: 11/13/2016 Goal Status: Active Interventions: Provide education on orientation to the wound center Notes: ` Pain, Acute or Chronic Nursing Diagnoses: Pain, acute or chronic: actual or potential Potential alteration in comfort, pain Goals: Patient/caregiver will verbalize adequate pain control between visits Date Initiated: 10/28/2016 Target Resolution Date: 02/12/2017 Goal Status: Active Interventions: Assess comfort goal upon admission Notes: ` Wound/Skin Impairment Nursing Diagnoses: Impaired tissue integrity Knowledge deficit related to ulceration/compromised skin integrity Goals: Ulcer/skin breakdown will have a volume reduction of 80% by week 12 Date Initiated: 10/28/2016 Target Resolution Date: 02/05/2017 Goal Status: Active RIVA, SESMA ([id number]) Interventions: Assess patient/caregiver ability to perform ulcer/skin care regimen upon admission and as needed Assess ulceration(s) every visit Notes: Electronic Signature(s) Signed: 11/25/2016 4:38:09 PM By: Montey Hora Entered By: Montey Hora on 11/25/2016 14:40:48 Alexandria [name] ([id number]) -------------------------------------------------------------------------------- Pain Assessment Details Patient Name: Alexandria [name] Date of Service: 11/25/2016 2:30 PM Medical Record Number: [id number] Patient Account Number: [id number] Date of Birth/Sex: [birth year] (81 y.o. Female) Treating  RN: Montey Hora Primary Care Margreat Widener: Elsie Stain Other Clinician: Referring Erasmo Vertz: Elsie Stain Treating Ellias Mcelreath/Extender: Frann Rider in Treatment: 4 Active Problems Location of Pain Severity and Description of Pain Patient Has Paino No Site Locations Pain Management and Medication Current  Pain Management: Notes Topical or injectable lidocaine is offered to patient for acute pain when surgical debridement is performed. If needed, Patient is instructed to use over the counter pain medication for the following 24-48 hours after debridement. Wound care MDs do not prescribed pain medications. Patient has chronic pain or uncontrolled pain. Patient has been instructed to make an appointment with their Primary Care Physician for pain management. Electronic Signature(s) Signed: 11/25/2016 4:38:09 PM By: Montey Hora Entered By: Montey Hora on 11/25/2016 14:27:09 Alexandria [name] ([id number]) -------------------------------------------------------------------------------- Patient/Caregiver Education Details Patient Name: Alexandria [name] Date of Service: 11/25/2016 2:30 PM Medical Record Number: [id number] Patient Account Number: [id number] Date of Birth/Gender: 1935-11-17 (81 y.o. Female) Treating RN: Montey Hora Primary Care Physician: Elsie Stain Other Clinician: Referring Physician: Elsie Stain Treating Physician/Extender: Frann Rider in Treatment: 4 Education Assessment Education Provided To: Patient Education Topics Provided Wound/Skin Impairment: Handouts: Other: wound care as ordered Methods: Demonstration, Explain/Verbal Responses: State content correctly Electronic Signature(s) Signed: 11/25/2016 4:38:09 PM By: Montey Hora Entered By: Montey Hora on 11/25/2016 14:41:41 Alexandria [name] ([id number]) -------------------------------------------------------------------------------- Wound Assessment Details Patient Name: Alexandria [name] Date of Service: 11/25/2016 2:30 PM Medical Record Number: [id number] Patient Account Number: [id number] Date of Birth/Sex: 05-02-1935 (81 y.o. Female) Treating RN: Montey Hora Primary Care Narada Uzzle: Elsie Stain Other Clinician: Referring Nealie Mchatton: Elsie Stain Treating Shakeel Disney/Extender: Frann Rider in Treatment: 4 Wound Status Wound Number: 2 Primary Diabetic Wound/Ulcer of the Lower Etiology: Extremity Wound Location: Right Lower Leg - Lateral Secondary Trauma, Other Wounding Event: Trauma Etiology: Date Acquired: 11/06/2016 Wound Open Weeks Of Treatment: 1 Status: Clustered Wound: No Comorbid Anemia, Chronic Obstructive History: Pulmonary Disease (COPD), Sleep Apnea, Congestive Heart Failure, Hypertension, Type II Diabetes, Neuropathy Photos Photo Uploaded By: Montey Hora on 11/25/2016 16:13:50 Wound Measurements Length: (cm) 0.9 Width: (cm) 1 Depth: (cm) 0.1 Area: (cm) 0.707 Volume: (cm) 0.071 % Reduction in Area: 64.3% % Reduction in Volume: 64.1% Epithelialization: None Tunneling: No Undermining: No Wound Description Classification: Grade 1 Wound Margin: Flat and Intact Exudate Amount: Large Exudate Type: Serous Exudate Color: amber Foul Odor After Cleansing: No Slough/Fibrino Yes Wound Bed Mccravy, Nikala E. ([id number]) Granulation Amount: Large (67-100%) Exposed Structure Granulation Quality: Red Fascia Exposed: No Necrotic Amount: Small (1-33%) Fat Layer (Subcutaneous Tissue) Exposed: No Necrotic Quality: Adherent Slough Tendon Exposed: No Muscle Exposed: No Joint Exposed: No Bone Exposed: No Periwound Skin Texture Texture Color No Abnormalities Noted: No No Abnormalities Noted: No Callus: No Atrophie Blanche: No Crepitus: No Cyanosis: No Excoriation: No Ecchymosis: No Induration: No Erythema: No Rash: No Hemosiderin Staining: No Scarring: No Mottled: No Pallor: No Moisture Rubor: No No Abnormalities Noted: No Dry / Scaly: No Temperature / Pain Maceration: No Temperature: No Abnormality Wound Preparation Ulcer Cleansing: Rinsed/Irrigated with Saline Topical Anesthetic Applied: Other: lidocaine 4%, Treatment Notes Wound #2 (Right, Lateral Lower Leg) 1. Cleansed with: Clean wound with Normal Saline 2.  Anesthetic Topical Lidocaine 4% cream to wound bed prior to debridement 4. Dressing Applied: Prisma Ag 5. Secondary Dressing Applied Gauze and Kerlix/Conform 7. Secured with Tape Notes netting Electronic Signature(s) Signed: 11/25/2016 4:38:09 PM By: Montey Hora Entered By: Montey Hora on 11/25/2016 14:31:01 Alexandria [name] ([id number]) -------------------------------------------------------------------------------- [place] Details Patient Name: Jinny Blossom,  Burnis Medin Date of Service: 11/25/2016 2:30 PM Medical Record Number: [id number] Patient Account Number: [id number] Date of Birth/Sex: Aug 17, 1935 (81 y.o. Female) Treating RN: Montey Hora Primary Care Acasia Skilton: Elsie Stain Other Clinician: Referring Teryn Gust: Elsie Stain Treating Lennell Shanks/Extender: Frann Rider in Treatment: 4 Vital Signs Time Taken: 14:27 Temperature (F): 98.2 Height (in): 65 Pulse (bpm): 63 Weight (lbs): 219 Respiratory Rate (breaths/min): 22 Body Mass Index (BMI): 36.4 Blood Pressure (mmHg): 119/66 Reference Range: 80 - 120 mg / dl Electronic Signature(s) Signed: 11/25/2016 4:38:09 PM By: Montey Hora Entered By: Montey Hora on 11/25/2016 14:27:30

## 2016-11-30 NOTE — Telephone Encounter (Signed)
Reviewed recommendations with patient and she reports that the compression hose have been great and that her swelling has gotten much better. She reports that her wound physician has been persistent that she come in to see [place]. Reviewed that she should take the lasix with potassium every other day with potassium and avoid high fluid intake. She states that she has been drinking extra fluids because of the fluid pill she was taking. Reviewed with her that she should try to decrease fluid intake to 2 liters a day and to continue the compression hose and leg elevation. She reports that she feels that it has gotten much better. Let her know that we have her on our waiting list to see Dr. Rockey Situ and that I would have scheduling give her a call to schedule next available. Instructed her to please call back if swelling should get worse and she verbalized understanding of our conversation, agreement with plan, and had no further questions at this time.

## 2016-12-01 NOTE — Telephone Encounter (Signed)
Pt scheduled with Dr Rockey Situ this Friday  Nothing further needed.

## 2016-12-02 ENCOUNTER — Encounter: Payer: BLUE CROSS/BLUE SHIELD | Admitting: Surgery

## 2016-12-02 DIAGNOSIS — S81811A Laceration without foreign body, right lower leg, initial encounter: Secondary | ICD-10-CM | POA: Diagnosis not present

## 2016-12-02 DIAGNOSIS — E11622 Type 2 diabetes mellitus with other skin ulcer: Secondary | ICD-10-CM | POA: Diagnosis not present

## 2016-12-02 DIAGNOSIS — I890 Lymphedema, not elsewhere classified: Secondary | ICD-10-CM | POA: Diagnosis not present

## 2016-12-02 NOTE — Progress Notes (Signed)
Cardiology Office Note  Date:  12/03/2016   ID:  JYOTI HARJU, DOB 12/25/1935, MRN [id number]  PCP:  Tonia Ghent, MD   Chief Complaint  Patient presents with  . OTHER    6 month f/u pt complains of tripping over toes and having fall. Pt is experiencing edema, weeping and redness legs.  Meds reviewed verbally with pt.    HPI:  81 year old woman with  obesity,   long history of smoking for 50 years, COPD,  DM2, dx in 2006, non-insulin-dependent, controlled, with  mild CKD hyperlipidemia,  hypertension,   2012  chest pain, stress test at that time with no ischemia, normal EF  July 2016 with COPD exacerbation, sepsis. History of sleep apnea, not on CPAP, seen by Dr. Llana Aliment, mask does not fit Chronic SOB, normal echo 09/2014 She presents for follow-up of her shortness of breath symptoms  In follow-up today she reports that her breathing has been relatively stable She has been taking Lasix more frequently for right lower extremity swelling She had a Fall, right knee laceration, 2 months ago Rehab one month Now with chronic right leg swelling on the right only, not on the left   she is wearing Compression hose Bilaterally  Uses a walker To get around  Now On chronic oxygen Performing Home PT, starting to feel more stable  Weight continues to be a problem  Previously afraid to get in the shower, has anxiety, shortness of breath Can't breath in hot steam  EKG on today's visit shows normal sinus rhythm with rate 83 bpm, no significant ST or T-wave changes  Other past medical history reviewed Previous Escherichia coli sepsis,  At that time had anemia with hematocrit down to 25, iron deficiency anemia Treated for COPD exacerbation CT scan of the abdomen showed mild descending aorta atherosclerosis  She reports that she quit smoking 10 years ago Hemoglobin A1c 6.1 She is taking Crestor and zetia.   PMH:   has a past medical history of Allergy, unspecified not elsewhere  classified; Chronic diastolic congestive heart failure ([place]); COPD (chronic obstructive pulmonary disease) ([place]) (03/08/1998); Depression (12/07/1998); Diabetes mellitus type II (02/05/2006); Disorders of bursae and tendons in shoulder region, unspecified; E. coli bacteremia; Esophagitis; GERD (gastroesophageal reflux disease); History of UTI; Hyperlipidemia (10/04/2000); Hypertension (03/08/1992); Hypothyroidism (03/08/1968); Iron deficiency anemia; Obesity; OSA (obstructive sleep apnea); Peripheral neuropathy; and Ventral hernia.  PSH:    Past Surgical History:  Procedure Laterality Date  . ABD U/S  03/19/1999   Nml x2 foci in liver  . ADENOSINE MYOVIEW  06/02/2007   Nml  . CARDIOLITE PERSANTINE  08/24/2000   Nml  . CAROTID U/S  08/24/2000   1-39% ICA stenosis  . CAROTID U/S  06/02/2007   No apprec change   . CARPAL TUNNEL RELEASE  12/1997   Right  . CESAREAN SECTION     x2 Breech/ repeat  . CHOLECYSTECTOMY  1997  . COLONOSCOPY WITH PROPOFOL N/A 02/12/2016   Procedure: COLONOSCOPY WITH PROPOFOL;  Surgeon: Milus Banister, MD;  Location: [facility];  Service: Endoscopy;  Laterality: N/A;  . CT ABD W & PELVIS WO/W CM     Abd hemangiomas of liver, 1 cm R renal cyst  . DENTAL SURGERY  2016   Implants  . DEXA  07/03/2003   Nml  . ESOPHAGOGASTRODUODENOSCOPY  12/05/1997   Nml (due to hoarseness)  . ESOPHAGOGASTRODUODENOSCOPY (EGD) WITH PROPOFOL N/A 02/12/2016   Procedure: ESOPHAGOGASTRODUODENOSCOPY (EGD) WITH PROPOFOL;  Surgeon: Milus Banister,  MD;  Location: [facility];  Service: Endoscopy;  Laterality: N/A;  . GALLBLADDER SURGERY    . HERNIA REPAIR  01/24/2009   Lap Ventr w/ Lysis of adhesions (Dr. Donne Hazel)  . knee arthroscopic surgery  years ago   right  . ROTATOR CUFF REPAIR  1984   Right, [place]  . SHOULDER OPEN ROTATOR CUFF REPAIR  02/08/2012   Procedure: ROTATOR CUFF REPAIR SHOULDER OPEN;  Surgeon: Magnus Sinning, MD;  Location: [facility];  Service: Orthopedics;   Laterality: Left;  Left Shoulder Open Anterior Acrominectomy Rotator Cuff Repair Open Distal Clavicle Resection ,tissue mend graft, and repair of biceps tendon  . THUMB RELEASE  12/1997   Right  . TONSILLECTOMY    . TOTAL ABDOMINAL HYSTERECTOMY  1985   Due to dysmennorhea  . US ECHOCARDIOGRAPHY  06/02/2007    Current Outpatient Prescriptions  Medication Sig Dispense Refill  . Coenzyme Q-10 200 MG CAPS Take 200 mg by mouth daily.     Marland Kitchen EPINEPHrine (EPIPEN) 0.3 mg/0.3 mL DEVI Inject 0.3 mg into the muscle once as needed (anaphylaxis).     Marland Kitchen esomeprazole (NEXIUM) 40 MG capsule Take 40 mg by mouth daily at 12 noon.    . ezetimibe (ZETIA) 10 MG tablet Take 1 tablet (10 mg total) by mouth at bedtime.    . ferrous sulfate 325 (65 FE) MG EC tablet Take 1 tablet (325 mg total) by mouth daily with breakfast.    . fexofenadine (ALLEGRA) 180 MG tablet Take 180 mg by mouth as needed.     Marland Kitchen FLUoxetine (PROZAC) 10 MG capsule TAKE 3 CAPSULES IN THE MORNING AND TAKE 3 CAPSULES IN THE EVENING 540 capsule 3  . fluticasone (FLONASE) 50 MCG/ACT nasal spray USE 2 SPRAYS INTO THE NOSE EVERY 12 HOURS AS NEEDED FOR STUFFY NOSE 48 g 2  . furosemide (LASIX) 20 MG tablet Take 1 tablet (20 mg total) by mouth daily as needed. (Patient taking differently: Take 20 mg by mouth daily as needed for fluid. ) 90 tablet 3  . ibuprofen (ADVIL,MOTRIN) 200 MG tablet Take 200-400 mg by mouth every 6 (six) hours as needed for moderate pain.     Marland Kitchen LORazepam (ATIVAN) 0.5 MG tablet TAKE 1 TABLET BY MOUTH TWICE DAILY AS NEEDED. 180 tablet 0  . metFORMIN (GLUCOPHAGE) 500 MG tablet Take 1 tablet (500 mg total) by mouth daily with supper. 90 tablet 3  . nitroGLYCERIN (NITROSTAT) 0.4 MG SL tablet Place 1 tablet (0.4 mg total) under the tongue every 5 (five) minutes as needed for chest pain. 25 tablet 3  . potassium chloride (K-DUR) 10 MEQ tablet Take 1 tablet (10 mEq total) by mouth daily as needed. (Patient taking differently: Take 10 mEq by  mouth daily as needed (Takes with furosemide doses.). ) 90 tablet 3  . PROAIR HFA 108 (90 Base) MCG/ACT inhaler INHALE 2 PUFFS INTO THE LUNGS EVERY 6 HOURS AS NEEDED FOR WHEEZING OR SHORTNESS OF BREATH 8.5 Inhaler 2  . rosuvastatin (CRESTOR) 10 MG tablet TAKE 1 TABLET (10 MG TOTAL) BY MOUTH EVERY OTHER DAY. 90 tablet 1  . SYMBICORT 160-4.5 MCG/ACT inhaler INHALE 2 PUFFS FIRST THING IN THE MORNING AND 2 PUFFS AGAIN IN THE EVENING ABOUT 12 HOURS LATER 30.6 Inhaler 2  . SYNTHROID 125 MCG tablet TAKE 1 TABLET (125 MCG TOTAL) BY MOUTH DAILY BEFORE BREAKFAST. 90 tablet 1  . valsartan (DIOVAN) 160 MG tablet Take 1 tablet (160 mg total) by mouth 2 (two) times daily. [place]  tablet 3  . vitamin B-12 (CYANOCOBALAMIN) 50 MCG tablet Take 50 mcg by mouth daily.      No current facility-administered medications for this visit.      Allergies:   Antihistamines, diphenhydramine-type; Sulfonamide derivatives; Incruse ellipta [umeclidinium bromide]; Clarithromycin; Codeine; Lyrica [pregabalin]; and Spiriva handihaler [tiotropium bromide monohydrate]   Social History:  The patient  reports that she quit smoking about 11 years ago. Her smoking use included Cigarettes. She has a 100.00 pack-year smoking history. She has never used smokeless tobacco. She reports that she drinks about 1.2 oz of alcohol per week . She reports that she does not use drugs.   Family History:   family history includes Breast cancer in her sister; Cystic fibrosis in her sister; Emphysema in her father; Heart disease in her mother; Hyperthyroidism in her brother and sister; Osteoarthritis in her brother; Thyroid disease in her mother.    Review of Systems: Review of Systems  Constitutional: Negative.   Respiratory: Positive for shortness of breath.   Cardiovascular: Positive for leg swelling.  Gastrointestinal: Negative.   Musculoskeletal: Negative.        Gait instability  Neurological: Negative.   Psychiatric/Behavioral: Negative.    All other systems reviewed and are negative.    PHYSICAL EXAM: VS:  BP 106/60 (BP Location: Left Arm, Patient Position: Sitting, Cuff Size: Large)   Pulse 83   Ht 5\' 5"  (1.651 m)   Wt 240 lb 8 oz (109.1 kg)   BMI 40.02 kg/m  , BMI Body mass index is 40.02 kg/m. GEN: Well nourished, well developed, in no acute distress, Morbid obese  HEENT: normal  Neck: no JVD, + carotid bruits , no  masses Cardiac: RRR; no murmurs, rubs, or gallops,Trace nonpitting lower extremity  edema Around the ankles Respiratory:  Mildly decreased breath sounds throughout, normal work of breathing GI: soft, nontender, nondistended, + BS MS: no deformity or atrophy  Skin: warm and dry, no rash Neuro:  Strength and sensation are intact Psych: euthymic mood, full affect    Recent Labs: 01/01/2016: TSH 0.93 06/16/2016: ALT 12; BUN 25; Creatinine, Ser 1.02; Potassium 4.7; Sodium 142 08/17/2016: Hemoglobin 13.3; Platelets 254.0    Lipid Panel Lab Results  Component Value Date   CHOL 165 01/01/2016   HDL 65.70 01/01/2016   LDLCALC 80 01/01/2016   TRIG 97.0 01/01/2016      Wt Readings from Last 3 Encounters:  12/03/16 240 lb 8 oz (109.1 kg)  10/26/16 219 lb (99.3 kg)  09/14/16 241 lb 12 oz (109.7 kg)       ASSESSMENT AND PLAN:    Chronic diastolic CHF (congestive heart failure) (HCC) - Plan: EKG 12-Lead High risk of diastolic CHF though appears euvolemic on today's visit Recommended she use her left lower extremity to direct her diuretic therapy Also would take Lasix for shortness of breath, abdominal bloating  Essential hypertension - Plan: EKG 12-Lead Blood pressure is well controlled on today's visit. No changes made to the medications.  Lower extremity swelling  Unilateral on the right after trauma to her right leg  This is secondary to venous or lymph  Recommended she continue compression hose  No swelling on the left   COPD exacerbation (HCC) On chronic oxygen , inhalers No  recent exacerbation, stable  Shortness of breath Multifactorial with underlying lung disease/COPD, morbid obesity, deconditioning Suspect component of chronic diastolic CHF As above  Recommended weight loss. Limited in her ability to exercise  Type 2 diabetes mellitus with diabetic  neuropathy, without long-term current use of insulin (HCC) Encouraged low carbohydrate diet for weight loss  Morbid obesity ([place]) We have encouraged careful diet management in an effort to lose weight.   Total encounter time more than 25 minutes  Greater than 50% was spent in counseling and coordination of care with the patient   Disposition:   F/U  12 months   Orders Placed This Encounter  Procedures  . EKG 12-Lead     Signed, Esmond Plants, M.D., Ph.D. 12/03/2016  [facility], [place]

## 2016-12-03 ENCOUNTER — Ambulatory Visit (INDEPENDENT_AMBULATORY_CARE_PROVIDER_SITE_OTHER): Payer: BLUE CROSS/BLUE SHIELD | Admitting: Cardiovascular Disease

## 2016-12-03 ENCOUNTER — Encounter: Payer: Self-pay | Admitting: Cardiovascular Disease

## 2016-12-03 VITALS — BP 106/60 | HR 83 | Ht 65.0 in | Wt 240.5 lb

## 2016-12-03 DIAGNOSIS — R600 Localized edema: Secondary | ICD-10-CM

## 2016-12-03 DIAGNOSIS — I10 Essential (primary) hypertension: Secondary | ICD-10-CM | POA: Diagnosis not present

## 2016-12-03 DIAGNOSIS — Z9989 Dependence on other enabling machines and devices: Secondary | ICD-10-CM | POA: Diagnosis not present

## 2016-12-03 DIAGNOSIS — I5032 Chronic diastolic (congestive) heart failure: Secondary | ICD-10-CM | POA: Diagnosis not present

## 2016-12-03 DIAGNOSIS — J441 Chronic obstructive pulmonary disease with (acute) exacerbation: Secondary | ICD-10-CM | POA: Diagnosis not present

## 2016-12-03 DIAGNOSIS — E662 Morbid (severe) obesity with alveolar hypoventilation: Secondary | ICD-10-CM | POA: Diagnosis not present

## 2016-12-03 DIAGNOSIS — E1140 Type 2 diabetes mellitus with diabetic neuropathy, unspecified: Secondary | ICD-10-CM | POA: Diagnosis not present

## 2016-12-03 DIAGNOSIS — E782 Mixed hyperlipidemia: Secondary | ICD-10-CM | POA: Diagnosis not present

## 2016-12-03 DIAGNOSIS — G4733 Obstructive sleep apnea (adult) (pediatric): Secondary | ICD-10-CM

## 2016-12-03 NOTE — Patient Instructions (Signed)

## 2016-12-04 NOTE — Progress Notes (Signed)
RAISA, DITTO ([id number]) Visit Report for 12/02/2016 Chief Complaint Document Details Patient Name: Alexandria [name] Date of Service: 12/02/2016 3:00 PM Medical Record Number: [id number] Patient Account Number: [id number] Date of Birth/Sex: 12-27-1935 (81 y.o. Female) Treating RN: Primary Care Provider: Elsie Stain Other Clinician: Referring Provider: Elsie Stain Treating Provider/Extender: Frann Rider in Treatment: 5 Information Obtained from: Patient Chief Complaint Patient presents to the wound care center for a consult due non healing wound to the right knee and swelling of the right lower extremity worse than the left for about 3 weeks now Electronic Signature(s) Signed: 12/02/2016 4:10:10 PM By: Christin Fudge MD, FACS Entered By: Christin Fudge on 12/02/2016 15:09:01 Alexandria [name] ([id number]) -------------------------------------------------------------------------------- HPI Details Patient Name: Alexandria [name] Date of Service: 12/02/2016 3:00 PM Medical Record Number: [id number] Patient Account Number: [id number] Date of Birth/Sex: 03/17/1935 (81 y.o. Female) Treating RN: Primary Care Provider: Elsie Stain Other Clinician: Referring Provider: Elsie Stain Treating Provider/Extender: Frann Rider in Treatment: 5 History of Present Illness HPI Description: 81 year old diabetic patient who recently was seen in the ER for a knee laceration which was closed with staples and then Steri-Stripped. She then developed a cellulitis on her right lower extremity and had to be treated with Septra and was previously on Keflex. She was asked to use a leg immobilizer and limited range of motion to 45o at the knee. The patient is known to have a past medical history of depression, hypothyroidism, hypertension, diastolic CHF, hyperlipidemia, diabetes mellitus, peripheral neuropathy. she is being treated by her pulmonologist for COPD with emphysema with  excarcebation and was given a course of prednisone recently. She also was asked to use a CPAP machine and other symptomatic treatment was given. Last hemoglobin A1c was 5.8% 2 months ago. she used to be a heavy smoker but has quit about 11 years ago. 11/04/2016 -- the patient had a lower extremity venous duplex reflux evaluation done on 11/03/2016 and this showed no evidence of acute bilateral lower extremity deep vein thrombosis. She had reflux in bilateral common femoral veins and there was reflux in the bilateral saphenofemoral junction. However they were unable to obtain reflux in the bilateral great saphenous veins of the small saphenous veins and it was a difficult examination technically. [place] wanted some advice about wound management while they were to be 11/12/2016 -- she returns after a vacation but has a new injury to her right lateral calf which she lacerated on a blunt object 11/25/2016 -- the patient still has not gotten in touch with her cardiologist regarding her diuretics and she continues to have significant lymphedema right lower extremity was then left Electronic Signature(s) Signed: 12/02/2016 4:10:10 PM By: Christin Fudge MD, FACS Entered By: Christin Fudge on 12/02/2016 15:09:05 Alexandria [name] ([id number]) -------------------------------------------------------------------------------- Physical Exam Details Patient Name: Alexandria [name] Date of Service: 12/02/2016 3:00 PM Medical Record Number: [id number] Patient Account Number: [id number] Date of Birth/Sex: [birth year] (81 y.o. Female) Treating RN: Primary Care Provider: Elsie Stain Other Clinician: Referring Provider: Elsie Stain Treating Provider/Extender: Frann Rider in Treatment: 5 Constitutional . Pulse regular. Respirations normal and unlabored. Afebrile. . Eyes Nonicteric. Reactive to light. Ears, Nose, Mouth, and Throat Lips, teeth, and gums  WNL.Marland Kitchen Moist mucosa without lesions. Neck supple and nontender. No palpable supraclavicular or cervical adenopathy. Normal sized without goiter. Respiratory WNL. No retractions.. Cardiovascular Pedal Pulses WNL. No clubbing, cyanosis or edema. Lymphatic No adneopathy. No adenopathy.  No adenopathy. Musculoskeletal Adexa without tenderness or enlargement.. Digits and nails w/o clubbing, cyanosis, infection, petechiae, ischemia, or inflammatory conditions.. Integumentary (Hair, Skin) No suspicious lesions. No crepitus or fluctuance. No peri-wound warmth or erythema. No masses.Marland Kitchen Psychiatric Judgement and insight Intact.. No evidence of depression, anxiety, or agitation.. Notes the wound on the right knee is completely healed and the one on the lower leg on the lateral mid shin area is also completely healed. Electronic Signature(s) Signed: 12/02/2016 4:10:10 PM By: Christin Fudge MD, FACS Entered By: Christin Fudge on 12/02/2016 15:10:13 Alexandria [name] ([id number]) -------------------------------------------------------------------------------- Physician Orders Details Patient Name: Alexandria [name] Date of Service: 12/02/2016 3:00 PM Medical Record Number: [id number] Patient Account Number: [id number] Date of Birth/Sex: Nov 20, 1935 (81 y.o. Female) Treating RN: Ahmed Prima Primary Care Provider: Elsie Stain Other Clinician: Referring Provider: Elsie Stain Treating Provider/Extender: Frann Rider in Treatment: 5 Verbal / Phone Orders: Yes Clinician: Carolyne Fiscal, Debi Read Back and Verified: Yes Diagnosis Coding Discharge From [facility] Services o Discharge from [place] your compression stockings. Protect area. Go to your appt with your cardiologist. Please call the office if you have any questions or concerns. Electronic Signature(s) Signed: 12/02/2016 4:10:10 PM By: Christin Fudge MD, FACS Entered By: Christin Fudge on 12/02/2016 15:10:30 Alexandria [name] ([id number]) -------------------------------------------------------------------------------- Problem List Details Patient Name: Alexandria [name] Date of Service: 12/02/2016 3:00 PM Medical Record Number: [id number] Patient Account Number: [id number] Date of Birth/Sex: 11-Aug-1935 (81 y.o. 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 Female) Treating RN: Primary Care Provider: Elsie Stain Other Clinician: Referring Provider: Elsie Stain Treating Provider/Extender: Frann Rider in Treatment: 5 Subjective Chief Complaint Information obtained from Patient Patient presents to the wound care center for a consult due non healing wound to the right knee and swelling of the right lower extremity worse than the left for about 3 weeks now History of Present Illness (HPI) 81 year old diabetic patient who  recently was seen in the ER for a knee laceration which was closed with staples and then Steri-Stripped. She then developed a cellulitis on her right lower extremity and had to be treated with Septra and was previously on Keflex. She was asked to use a leg immobilizer and limited range of motion to 45 at the knee. The patient is known to have a past medical history of depression, hypothyroidism, hypertension, diastolic CHF, hyperlipidemia, diabetes mellitus, peripheral neuropathy. she is being treated by her pulmonologist for COPD with emphysema with excarcebation and was given a course of prednisone recently. She also was asked to use a CPAP machine and other symptomatic treatment was given. Last hemoglobin A1c was 5.8% 2 months ago. she used to be a heavy smoker but has  quit about 11 years ago. 11/04/2016 -- the patient had a lower extremity venous duplex reflux evaluation done on 11/03/2016 and this showed no evidence of acute bilateral lower extremity deep vein thrombosis. She had reflux in bilateral common femoral veins and there was reflux in the bilateral saphenofemoral junction. However they were unable to obtain reflux in the bilateral great saphenous veins of the small saphenous veins and it was a difficult examination technically. [place] wanted some advice about wound management while they were to be 11/12/2016 -- she returns after a vacation but has a new injury to her right lateral calf which she lacerated on a blunt object 11/25/2016 -- the patient still has not gotten in touch with her cardiologist regarding her diuretics and she continues to have significant lymphedema right lower extremity was then left [place], Marylon E. ([id number]) Objective Constitutional Pulse regular. Respirations normal and unlabored. Afebrile. Vitals Time Taken: 2:52 PM, Height: 65 in, Weight: 219 lbs, BMI: 36.4, Temperature: 97.8 F, Pulse: 66 bpm,  Respiratory Rate: 20 breaths/min, Blood Pressure: 150/60 mmHg. Eyes Nonicteric. Reactive to light. Ears, Nose, Mouth, and Throat Lips, teeth, and gums WNL.Marland Kitchen Moist mucosa without lesions. Neck supple and nontender. No palpable supraclavicular or cervical adenopathy. Normal sized without goiter. Respiratory WNL. No retractions.. Cardiovascular Pedal Pulses WNL. No clubbing, cyanosis or edema. Lymphatic No adneopathy. No adenopathy. No adenopathy. Musculoskeletal Adexa without tenderness or enlargement.. Digits and nails w/o clubbing, cyanosis, infection, petechiae, ischemia, or inflammatory conditions.Marland Kitchen Psychiatric Judgement and insight Intact.. No evidence of depression, anxiety, or agitation.. General Notes: the wound on the right knee is completely healed and the one on the lower leg on the lateral mid shin area is also completely healed. Integumentary (Hair, Skin) No suspicious lesions. No crepitus or fluctuance. No peri-wound warmth or erythema. No masses.. Wound #2 status is Open. Original cause of wound was Trauma. The wound is located on the Right,Lateral Lower Leg. The wound measures 0cm length x 0cm width x 0cm depth; 0cm^2 area and 0cm^3 volume. There is no tunneling or undermining noted. There is a none present amount of drainage noted. The wound margin is flat and intact. There is no granulation within the wound bed. There is no necrotic tissue within the wound bed. The periwound skin appearance did not exhibit: Callus, Crepitus, Excoriation, Induration, Rash, Scarring, Dry/Scaly, Maceration, Atrophie Blanche, Cyanosis, Ecchymosis, Hemosiderin Staining, Mottled, Pallor, Rubor, Erythema. Periwound temperature was noted as No Abnormality. KORIANNA, WASHER ([id number]) Assessment Active Problems ICD-10 E11.622 - Type 2 diabetes mellitus with other skin ulcer S81.001A - Unspecified open wound, right knee, initial encounter I89.0 - Lymphedema, not elsewhere  classified I87.331 - Chronic venous hypertension (idiopathic) with ulcer and inflammation of right lower extremity L97.212 - Non-pressure chronic ulcer of right calf with fat layer exposed Plan Discharge From [facility] Services: Discharge from [place] your compression stockings. Protect area. Go to your appt with your cardiologist. Please call the office if you have any questions or concerns. the wound is healed and I have asked her to continue wearing a protective form and her compression stockings at least for the next 2 weeks. She should persist with the compression stockings all her life to be wound all days except at bedtime. She is also going to be seeing her cardiologist for fine tuning her diuretics Electronic Signature(s) Signed: 12/02/2016 4:10:10 PM By: Christin Fudge MD, FACS Entered By: Christin Fudge on 12/02/2016 15:11:04 [place],  SHARNESE HEATH ([id number]) -------------------------------------------------------------------------------- SuperBill Details Patient Name: Alexandria [name] Date of Service: 12/02/2016 Medical Record Number: [id number] Patient Account Number: [id number] Date of Birth/Sex: 1936/03/04 (81 y.o. Female) Treating RN: Primary Care Provider: Elsie Stain Other Clinician: Referring Provider: Elsie Stain Treating Provider/Extender: Frann Rider in Treatment: 5 Diagnosis Coding ICD-10 Codes Code Description E11.622 Type 2 diabetes mellitus with other skin ulcer S81.001A Unspecified open wound, right knee, initial encounter I89.0 Lymphedema, not elsewhere classified Chronic venous hypertension (idiopathic) with ulcer and inflammation of right lower I87.331 extremity L97.212 Non-pressure chronic ulcer of right calf with fat layer exposed Facility Procedures CPT4 Code: [id number] Description: [phone] - WOUND CARE VISIT-LEV 2 EST PT Modifier: Quantity: 1 Physician Procedures CPT4: Description Modifier Quantity Code [id number] - WC PHYS  LEVEL 2 - EST PT 1 ICD-10 Description Diagnosis E11.622 Type 2 diabetes mellitus with other skin ulcer S81.001A Unspecified open wound, right knee, initial encounter I89.0 Lymphedema, not  elsewhere classified I87.331 Chronic venous hypertension (idiopathic) with ulcer and inflammation of right lower extremity Electronic Signature(s) Signed: 12/02/2016 4:10:10 PM By: Christin Fudge MD, FACS Signed: 12/02/2016 4:27:23 PM By: Alric Quan Entered By: Alric Quan on 12/02/2016 15:14:33

## 2016-12-06 NOTE — Progress Notes (Signed)
EDLIN, FORD ([id number]) Visit Report for 12/02/2016 Arrival Information Details Patient Name: Alexandria [name], Alexandria [name] Date of Service: 12/02/2016 3:00 PM Medical Record Number: [id number] Patient Account Number: [id number] Date of Birth/Sex: May 31, 1935 (81 y.o. Female) Treating RN: Carolyne Fiscal, Debi Primary Care Sekou Zuckerman: Elsie Stain Other Clinician: Referring Kindell Strada: Elsie Stain Treating Sabrine Patchen/Extender: Frann Rider in Treatment: 5 Visit Information History Since Last Visit All ordered tests and consults were completed: No Patient Arrived: [name] Added or deleted any medications: No Arrival Time: 14:50 Any new allergies or adverse reactions: No Accompanied By: self Had a fall or experienced change in No Transfer Assistance: None activities of daily living that may affect Patient Identification Verified: Yes risk of falls: Secondary Verification Process Completed: Yes Signs or symptoms of abuse/neglect since last No Patient Requires Transmission-Based No visito Precautions: Hospitalized since last visit: No Patient Has Alerts: Yes Has Dressing in Place as Prescribed: Yes Patient Alerts: DM II Pain Present Now: No Electronic Signature(s) Signed: 12/02/2016 4:27:23 PM By: Alric Quan Entered By: Alric Quan on 12/02/2016 14:52:20 Alexandria [name] ([id number]) -------------------------------------------------------------------------------- Clinic Level of Care Assessment Details Patient Name: Alexandria [name] Date of Service: 12/02/2016 3:00 PM Medical Record Number: [id number] Patient Account Number: [id number] Date of Birth/Sex: 1935-08-06 (81 y.o. Female) Treating RN: Carolyne Fiscal, Debi Primary Care Koal Eslinger: Elsie Stain Other Clinician: Referring Katesha Eichel: Elsie Stain Treating Raiana Pharris/Extender: Frann Rider in Treatment: 5 Clinic Level of Care Assessment Items TOOL 4 Quantity Score X - Use when only an EandM is performed on FOLLOW-UP  visit 1 0 ASSESSMENTS - Nursing Assessment / Reassessment X - Reassessment of Co-morbidities (includes updates in patient status) 1 10 X - Reassessment of Adherence to Treatment Plan 1 5 ASSESSMENTS - Wound and Skin Assessment / Reassessment X - Simple Wound Assessment / Reassessment - one wound 1 5 []  - Complex Wound Assessment / Reassessment - multiple wounds 0 []  - Dermatologic / Skin Assessment (not related to wound area) 0 ASSESSMENTS - Focused Assessment []  - Circumferential Edema Measurements - multi extremities 0 []  - Nutritional Assessment / Counseling / Intervention 0 []  - Lower Extremity Assessment (monofilament, tuning fork, pulses) 0 []  - Peripheral Arterial Disease Assessment (using hand held doppler) 0 ASSESSMENTS - Ostomy and/or Continence Assessment and Care []  - Incontinence Assessment and Management 0 []  - Ostomy Care Assessment and Management (repouching, etc.) 0 PROCESS - Coordination of Care X - Simple Patient / Family Education for ongoing care 1 15 []  - Complex (extensive) Patient / Family Education for ongoing care 0 []  - Staff obtains Programmer, systems, Records, Test Results / Process Orders 0 []  - Staff telephones HHA, Nursing Homes / Clarify orders / etc 0 []  - Routine Transfer to another Facility (non-emergent condition) 0 OLETA, GUNNOE. ([id number]) []  - Routine Hospital Admission (non-emergent condition) 0 []  - New Admissions / Biomedical engineer / Ordering NPWT, Apligraf, etc. 0 []  - Emergency Hospital Admission (emergent condition) 0 X - Simple Discharge Coordination 1 10 []  - Complex (extensive) Discharge Coordination 0 PROCESS - Special Needs []  - Pediatric / Minor Patient Management 0 []  - Isolation Patient Management 0 []  - Hearing / Language / Visual special needs 0 []  - Assessment of Community assistance (transportation, D/C planning, etc.) 0 []  - Additional assistance / Altered mentation 0 []  - Support Surface(s) Assessment (bed, cushion, seat,  etc.) 0 INTERVENTIONS - Wound Cleansing / Measurement X - Simple Wound Cleansing - one wound 1 5 []  - Complex Wound Cleansing - multiple wounds 0 X -  Wound Imaging (photographs - any number of wounds) 1 5 []  - Wound Tracing (instead of photographs) 0 []  - Simple Wound Measurement - one wound 0 []  - Complex Wound Measurement - multiple wounds 0 INTERVENTIONS - Wound Dressings []  - Small Wound Dressing one or multiple wounds 0 []  - Medium Wound Dressing one or multiple wounds 0 []  - Large Wound Dressing one or multiple wounds 0 []  - Application of Medications - topical 0 []  - Application of Medications - injection 0 INTERVENTIONS - Miscellaneous []  - External ear exam 0 Carlton, Traeh E. ([id number]) []  - Specimen Collection (cultures, biopsies, blood, body fluids, etc.) 0 []  - Specimen(s) / Culture(s) sent or taken to Lab for analysis 0 []  - Patient Transfer (multiple staff / Harrel Lemon Lift / Similar devices) 0 []  - Simple Staple / Suture removal (25 or less) 0 []  - Complex Staple / Suture removal (26 or more) 0 []  - Hypo / Hyperglycemic Management ([name] monitor of Blood Glucose) 0 []  - Ankle / Brachial Index (ABI) - do not check if billed separately 0 X - Vital Signs 1 5 Has the patient been seen at the hospital within the last three years: Yes Total Score: 60 Level Of Care: New/Established - Level 2 Electronic Signature(s) Signed: 12/02/2016 4:27:23 PM By: Alric Quan Entered By: Alric Quan on 12/02/2016 15:14:25 Alexandria [name] ([id number]) -------------------------------------------------------------------------------- Encounter Discharge Information Details Patient Name: Alexandria [name] Date of Service: 12/02/2016 3:00 PM Medical Record Number: [id number] Patient Account Number: [id number] Date of Birth/Sex: Dec 11, 1935 (81 y.o. Female) Treating RN: Carolyne Fiscal, Debi Primary Care Quade Ramirez: Elsie Stain Other Clinician: Referring Franki Alcaide: Elsie Stain Treating  Nashid Pellum/Extender: Frann Rider in Treatment: 5 Encounter Discharge Information Items Discharge Pain Level: 0 Discharge Condition: Stable Ambulatory Status: Walker Discharge Destination: Home Transportation: Private Auto Accompanied By: self Schedule Follow-up Appointment: No Medication Reconciliation completed No and provided to Patient/Care Ravin Bendall: Provided on Clinical Summary of Care: 12/02/2016 Form Type Recipient Paper Patient CG Electronic Signature(s) Signed: 12/06/2016 9:00:05 AM By: Ruthine Dose Entered By: Ruthine Dose on 12/02/2016 15:06:04 Alexandria [name] ([id number]) -------------------------------------------------------------------------------- Lower Extremity Assessment Details Patient Name: Alexandria [name] Date of Service: 12/02/2016 3:00 PM Medical Record Number: [id number] Patient Account Number: [id number] Date of Birth/Sex: [birth year] (81 y.o. Female) Treating RN: Carolyne Fiscal, Debi Primary Care Ruthene Methvin: Elsie Stain Other Clinician: Referring Omid Deardorff: Elsie Stain Treating Anett Ranker/Extender: Frann Rider in Treatment: 5 Vascular Assessment Pulses: Dorsalis Pedis Palpable: [Right:Yes] Posterior Tibial Extremity colors, hair growth, and conditions: Extremity Color: [Right:Hyperpigmented] Temperature of Extremity: [Right:Warm] Capillary Refill: [Right:< 3 seconds] Toe Nail Assessment Left: Right: Thick: No Discolored: No Deformed: No Improper Length and Hygiene: No Electronic Signature(s) Signed: 12/02/2016 4:27:23 PM By: Alric Quan Entered By: Alric Quan on 12/02/2016 15:00:59 Alexandria [name] ([id number]) -------------------------------------------------------------------------------- Multi Wound Chart Details Patient Name: Alexandria [name] Date of Service: 12/02/2016 3:00 PM Medical Record Number: [id number] Patient Account Number: [id number] Date of Birth/Sex: 11-05-1935 (81 y.o. Female) Treating RN:  Ahmed Prima Primary Care Corliss Coggeshall: Elsie Stain Other Clinician: Referring Andrika Peraza: Elsie Stain Treating Liela Rylee/Extender: Frann Rider in Treatment: 5 Vital Signs Height(in): 65 Pulse(bpm): 66 Weight(lbs): 219 Blood Pressure 150/60 (mmHg): Body Mass Index(BMI): 36 Temperature(F): 97.8 Respiratory Rate 20 (breaths/min): Photos: [2:No Photos] [N/A:N/A] Wound Location: [2:Right Lower Leg - Lateral] [N/A:N/A] Wounding Event: [2:Trauma] [N/A:N/A] Primary Etiology: [2:Diabetic Wound/Ulcer of the Lower Extremity] [N/A:N/A] Secondary Etiology: [2:Trauma, Other] [N/A:N/A] Comorbid History: [2:Anemia, Chronic Obstructive Pulmonary Disease (COPD), Sleep Apnea, Congestive Heart Failure, Hypertension, Type II Diabetes,  Neuropathy] [N/A:N/A] Date Acquired: [2:11/06/2016] [N/A:N/A] Weeks of Treatment: [2:2] [N/A:N/A] Wound Status: [2:Open] [N/A:N/A] Measurements L x W x D 0x0x0 [N/A:N/A] (cm) Area (cm) : [2:0] [N/A:N/A] Volume (cm) : [2:0] [N/A:N/A] % Reduction in Area: [2:100.00%] [N/A:N/A] % Reduction in Volume: 100.00% [N/A:N/A] Classification: [2:Grade 1] [N/A:N/A] Exudate Amount: [2:None Present] [N/A:N/A] Wound Margin: [2:Flat and Intact] [N/A:N/A] Granulation Amount: [2:None Present (0%)] [N/A:N/A] Necrotic Amount: [2:None Present (0%)] [N/A:N/A] Exposed Structures: [2:Fascia: No Fat Layer (Subcutaneous Tissue) Exposed: No] [N/A:N/A] Tendon: No Muscle: No Joint: No Bone: No Epithelialization: Large (67-100%) N/A N/A Periwound Skin Texture: Excoriation: No N/A N/A Induration: No Callus: No Crepitus: No Rash: No Scarring: No Periwound Skin Maceration: No N/A N/A Moisture: Dry/Scaly: No Periwound Skin Color: Atrophie Blanche: No N/A N/A Cyanosis: No Ecchymosis: No Erythema: No Hemosiderin Staining: No Mottled: No Pallor: No Rubor: No Temperature: No Abnormality N/A N/A Tenderness on No N/A N/A Palpation: Wound Preparation: Ulcer Cleansing:  N/A N/A Rinsed/Irrigated with Saline Topical Anesthetic Applied: None Treatment Notes Electronic Signature(s) Signed: 12/02/2016 4:10:10 PM By: Christin Fudge MD, FACS Entered By: Christin Fudge on 12/02/2016 15:08:54 Alexandria [name] ([id number]) -------------------------------------------------------------------------------- Selz Details Patient Name: Alexandria [name] Date of Service: 12/02/2016 3:00 PM Medical Record Number: [id number] Patient Account Number: [id number] Date of Birth/Sex: [birth year] (81 y.o. Female) Treating RN: Ahmed Prima Primary Care Orit Sanville: Elsie Stain Other Clinician: Referring Kenzie Flakes: Elsie Stain Treating Yailyn Strack/Extender: Frann Rider in Treatment: 5 Active Inactive Electronic Signature(s) Signed: 12/02/2016 4:27:23 PM By: Alric Quan Entered By: Alric Quan on 12/02/2016 15:01:29 Alexandria [name] ([id number]) -------------------------------------------------------------------------------- Pain Assessment Details Patient Name: Alexandria [name] Date of Service: 12/02/2016 3:00 PM Medical Record Number: [id number] Patient Account Number: [id number] Date of Birth/Sex: 1935-09-23 (81 y.o. Female) Treating RN: Ahmed Prima Primary Care Adeliz Tonkinson: Elsie Stain Other Clinician: Referring Angas Isabell: Elsie Stain Treating Jaylene Schrom/Extender: Frann Rider in Treatment: 5 Active Problems Location of Pain Severity and Description of Pain Patient Has Paino No Site Locations Pain Management and Medication Current Pain Management: Electronic Signature(s) Signed: 12/02/2016 4:27:23 PM By: Alric Quan Entered By: Alric Quan on 12/02/2016 14:52:27 Alexandria [name] ([id number]) -------------------------------------------------------------------------------- Patient/Caregiver Education Details Patient Name: Alexandria [name] Date of Service: 12/02/2016 3:00 PM Medical Record Number:  [id number] Patient Account Number: [id number] Date of Birth/Gender: February 11, 1936 (81 y.o. Female) Treating RN: Ahmed Prima Primary Care Physician: Elsie Stain Other Clinician: Referring Physician: Elsie Stain Treating Physician/Extender: Frann Rider in Treatment: 5 Education Assessment Education Provided To: Patient Education Topics Provided Wound/Skin Impairment: Handouts: Other: Please call the office if you have any questions or concerns. Methods: Explain/Verbal Responses: State content correctly Electronic Signature(s) Signed: 12/02/2016 4:27:23 PM By: Alric Quan Entered By: Alric Quan on 12/02/2016 15:03:20 Alexandria [name] ([id number]) -------------------------------------------------------------------------------- Wound Assessment Details Patient Name: Alexandria [name] Date of Service: 12/02/2016 3:00 PM Medical Record Number: [id number] Patient Account Number: [id number] Date of Birth/Sex: February 23, 1936 (81 y.o. Female) Treating RN: Carolyne Fiscal, Debi Primary Care Castella Lerner: Elsie Stain Other Clinician: Referring Zaniel Marineau: Elsie Stain Treating Emily Forse/Extender: Frann Rider in Treatment: 5 Wound Status Wound Number: 2 Primary Diabetic Wound/Ulcer of the Lower Etiology: Extremity Wound Location: Right Lower Leg - Lateral Secondary Trauma, Other Wounding Event: Trauma Etiology: Date Acquired: 11/06/2016 Wound Open Weeks Of Treatment: 2 Status: Clustered Wound: No Comorbid Anemia, Chronic Obstructive History: Pulmonary Disease (COPD), Sleep Apnea, Congestive Heart Failure, Hypertension, Type II Diabetes, Neuropathy Photos Photo Uploaded By: Alric Quan on 12/02/2016 16:04:33 Wound Measurements Length: (cm) 0 % Reduction i Width: (  cm) 0 % Reduction i Depth: (cm) 0 Epithelializa Area: (cm) 0 Tunneling: Volume: (cm) 0 Undermining: n Area: 100% n Volume: 100% tion: Large (67-100%) No No Wound  Description Classification: Grade 1 Wound Margin: Flat and Intact Exudate Amount: None Present Foul Odor After Cleansing: No Slough/Fibrino No Wound Bed Granulation Amount: None Present (0%) Exposed Structure Necrotic Amount: None Present (0%) Fascia Exposed: No Viscomi, Linetta E. ([id number]) Fat Layer (Subcutaneous Tissue) Exposed: No Tendon Exposed: No Muscle Exposed: No Joint Exposed: No Bone Exposed: No Periwound Skin Texture Texture Color No Abnormalities Noted: No No Abnormalities Noted: No Callus: No Atrophie Blanche: No Crepitus: No Cyanosis: No Excoriation: No Ecchymosis: No Induration: No Erythema: No Rash: No Hemosiderin Staining: No Scarring: No Mottled: No Pallor: No Moisture Rubor: No No Abnormalities Noted: No Dry / Scaly: No Temperature / Pain Maceration: No Temperature: No Abnormality Wound Preparation Ulcer Cleansing: Rinsed/Irrigated with Saline Topical Anesthetic Applied: None Electronic Signature(s) Signed: 12/02/2016 4:27:23 PM By: Alric Quan Entered By: Alric Quan on 12/02/2016 15:00:35 Alexandria [name] ([id number]) -------------------------------------------------------------------------------- Vitals Details Patient Name: Alexandria [name] Date of Service: 12/02/2016 3:00 PM Medical Record Number: [id number] Patient Account Number: [id number] Date of Birth/Sex: 03-May-1935 (81 y.o. Female) Treating RN: Carolyne Fiscal, Debi Primary Care Dashawn Golda: Elsie Stain Other Clinician: Referring Sanah Kraska: Elsie Stain Treating Shamond Skelton/Extender: Frann Rider in Treatment: 5 Vital Signs Time Taken: 14:52 Temperature (F): 97.8 Height (in): 65 Pulse (bpm): 66 Weight (lbs): 219 Respiratory Rate (breaths/min): 20 Body Mass Index (BMI): 36.4 Blood Pressure (mmHg): 150/60 Reference Range: 80 - 120 mg / dl Electronic Signature(s) Signed: 12/02/2016 4:27:23 PM By: Alric Quan Entered By: Alric Quan on 12/02/2016  14:53:09

## 2016-12-08 ENCOUNTER — Telehealth: Payer: Self-pay | Admitting: Internal Medicine

## 2016-12-08 NOTE — Telephone Encounter (Signed)
Please fax foot exam note to [phone] for patient to get approval for orthotics.

## 2016-12-08 NOTE — Telephone Encounter (Signed)
Please advise, it looks like patient was suppose to schedule a foot exam, and did not, this call was done in August, the last time she was seen was June. Could I print that follow up??

## 2016-12-09 NOTE — Telephone Encounter (Signed)
Yes, we can try to send the last note

## 2016-12-09 NOTE — Telephone Encounter (Signed)
Faxed over last note.

## 2016-12-24 ENCOUNTER — Telehealth: Payer: Self-pay

## 2016-12-24 NOTE — Telephone Encounter (Signed)
Erin PT with [facility] left v/m requesting verbal orders to continue [facility] PT 2 x a week for 4 weeks for gait training, activity tolerance and strengthening.

## 2016-12-26 NOTE — Telephone Encounter (Signed)
Please give the order.  Thanks.   

## 2016-12-27 NOTE — Telephone Encounter (Signed)
Left message on voicemail for Erin to call back.

## 2016-12-29 NOTE — Telephone Encounter (Signed)
Verbal order given to [place] as instructed.

## 2017-01-05 ENCOUNTER — Telehealth: Payer: Self-pay | Admitting: Family Medicine

## 2017-01-05 ENCOUNTER — Other Ambulatory Visit: Payer: Self-pay

## 2017-01-05 MED ORDER — GLUCOSE BLOOD VI STRP: ORAL_STRIP | 5 refills | Status: DC

## 2017-01-05 MED ORDER — ONETOUCH DELICA LANCETS FINE MISC: 5 refills | Status: DC

## 2017-01-05 NOTE — Telephone Encounter (Signed)
Copied from [place] [id number]. Topic: Quick Communication - See Telephone Encounter >> Jan 05, 2017  1:45 PM Bea Graff, NT wrote: CRM for notification. See Telephone encounter for:  01/05/17. Patient is requesting a refill of her OneTouch test strips and lancets to [facility] [place]. Please call pt once these has been refilled. She said its been 6 years since she has had to have any, she just now ran out.

## 2017-01-05 NOTE — Telephone Encounter (Signed)
Submitted

## 2017-01-05 NOTE — Telephone Encounter (Signed)
She is requesting a refill on her OneTouch test strips and lancets however it has been 6 years since she has had them refilled.

## 2017-01-06 ENCOUNTER — Ambulatory Visit: Payer: BLUE CROSS/BLUE SHIELD

## 2017-01-06 ENCOUNTER — Ambulatory Visit (INDEPENDENT_AMBULATORY_CARE_PROVIDER_SITE_OTHER): Payer: BLUE CROSS/BLUE SHIELD

## 2017-01-06 ENCOUNTER — Other Ambulatory Visit: Payer: Self-pay | Admitting: Internal Medicine

## 2017-01-06 DIAGNOSIS — Z23 Encounter for immunization: Secondary | ICD-10-CM | POA: Diagnosis not present

## 2017-01-11 ENCOUNTER — Telehealth: Payer: Self-pay | Admitting: *Deleted

## 2017-01-11 ENCOUNTER — Other Ambulatory Visit: Payer: Self-pay

## 2017-01-11 MED ORDER — GLUCOSE BLOOD VI STRP: ORAL_STRIP | 12 refills | Status: DC

## 2017-01-11 NOTE — Telephone Encounter (Signed)
Patient called and states she was at the pharmacy and she states a refill was sent in for the One touch Vero. She needs a refill of her OneTouch Vero Test Strips 90 day Supply sent in instead. Please send in to her pharmacy. [facility] [place]. Please advise. Thank you

## 2017-01-11 NOTE — Telephone Encounter (Signed)
Submitted

## 2017-01-12 ENCOUNTER — Telehealth: Payer: Self-pay

## 2017-01-12 NOTE — Telephone Encounter (Signed)
Marcelle Overlie with [employer] said [facility] started PA for [facility] PT; was denied and appeal has been started but not by [facility] and was suggested to call PCP; [name] and [facility] LPN not aware of PA. Marcelle Overlie said that answered her question and nothing further needed.

## 2017-01-23 ENCOUNTER — Telehealth: Payer: Self-pay | Admitting: Family Medicine

## 2017-01-23 NOTE — Telephone Encounter (Signed)
Please give the order.  Thanks.   

## 2017-01-23 NOTE — Telephone Encounter (Signed)
Copied from [place] #[id number]. Topic: General - Other >> Jan 21, 2017  9:24 AM Milady Stare wrote:    Holley Dexter with [facility] and is asking for continue PT  2 times a week for 4 weeks    [phone]

## 2017-01-24 NOTE — Telephone Encounter (Signed)
Holley Dexter with [facility] advised.

## 2017-02-03 ENCOUNTER — Other Ambulatory Visit: Payer: Self-pay | Admitting: Family Medicine

## 2017-02-03 NOTE — Telephone Encounter (Signed)
CPE scheduled 02/09/17, med last filled on 10/31/16 #180 tabs with 0 refills, please advise

## 2017-02-04 NOTE — Telephone Encounter (Signed)
Please call in.  Thanks.   

## 2017-02-04 NOTE — Telephone Encounter (Signed)
Medication phoned to pharmacy.  

## 2017-02-09 ENCOUNTER — Encounter: Payer: Self-pay | Admitting: Family Medicine

## 2017-02-09 ENCOUNTER — Telehealth: Payer: Self-pay

## 2017-02-09 ENCOUNTER — Ambulatory Visit (INDEPENDENT_AMBULATORY_CARE_PROVIDER_SITE_OTHER): Payer: BLUE CROSS/BLUE SHIELD | Admitting: Family Medicine

## 2017-02-09 VITALS — BP 130/80 | HR 74 | Temp 97.7°F | Ht 65.0 in | Wt 241.0 lb

## 2017-02-09 DIAGNOSIS — E1140 Type 2 diabetes mellitus with diabetic neuropathy, unspecified: Secondary | ICD-10-CM | POA: Diagnosis not present

## 2017-02-09 DIAGNOSIS — L0390 Cellulitis, unspecified: Secondary | ICD-10-CM | POA: Diagnosis not present

## 2017-02-09 DIAGNOSIS — E611 Iron deficiency: Secondary | ICD-10-CM | POA: Diagnosis not present

## 2017-02-09 DIAGNOSIS — F419 Anxiety disorder, unspecified: Secondary | ICD-10-CM | POA: Diagnosis not present

## 2017-02-09 DIAGNOSIS — Z7189 Other specified counseling: Secondary | ICD-10-CM

## 2017-02-09 DIAGNOSIS — E119 Type 2 diabetes mellitus without complications: Secondary | ICD-10-CM

## 2017-02-09 DIAGNOSIS — D649 Anemia, unspecified: Secondary | ICD-10-CM | POA: Diagnosis not present

## 2017-02-09 DIAGNOSIS — G629 Polyneuropathy, unspecified: Secondary | ICD-10-CM | POA: Diagnosis not present

## 2017-02-09 DIAGNOSIS — I10 Essential (primary) hypertension: Secondary | ICD-10-CM

## 2017-02-09 DIAGNOSIS — Z0000 Encounter for general adult medical examination without abnormal findings: Secondary | ICD-10-CM | POA: Diagnosis not present

## 2017-02-09 DIAGNOSIS — J9611 Chronic respiratory failure with hypoxia: Secondary | ICD-10-CM

## 2017-02-09 MED ORDER — FLUOXETINE HCL 10 MG PO CAPS: ORAL_CAPSULE | ORAL | 3 refills | Status: DC

## 2017-02-09 NOTE — Progress Notes (Signed)
I have personally reviewed the Medicare Annual Wellness questionnaire and have noted 1. The patient's medical and social history 2. Their use of alcohol, tobacco or illicit drugs 3. Their current medications and supplements 4. The patient's functional ability including ADL's, fall risks, home safety risks and hearing or visual             impairment. 5. Diet and physical activities 6. Evidence for depression or mood disorders  The patients weight, height, BMI have been recorded in the chart and visual acuity is per eye clinic.  I have made referrals, counseling and provided education to the patient based review of the above and I have provided the pt with a written personalized care plan for preventive services.  Provider list updated- see scanned forms.  Routine anticipatory guidance given to patient.  See health maintenance. The possibility exists that previously documented standard health maintenance information may have been brought forward from a previous encounter into this note.  If needed, that same information has been updated to reflect the current situation based on today's encounter.    Flu 2018 Shingles 2007 PNA up to date Tetanus 2018 Colonoscopy 2017 Mammogram 02/2016. D/w pt, repeat pending.   dxa 2017 Advance directive- husband and daughter Adah Salvage equally designated if patient were incapacitated.  Cognitive function addressed- see scanned forms- and if abnormal then additional documentation follows.   She has had a lot of stressors this year, d/w pt.  Still on SSRI witih PRN use of BZD, with routine BZD cautions d/w pt.  The current rxs help some with anxiety sx.  She needed refill done on SSRI, done at [facility].  Per patient, we had denied the refill. She had prev refill for 1 year supply done in 2018.  I have no evidence of SSRI request coming to the clinic for [place] to address, d/w pt.  We didn't "short" here.  She hasn't run out of SSRI yet.  Would continue as is.    H/o  anemia, recheck labs pending.  See notes on labs.  D/w pt.    HTN.  Valsartan recall d/w pt.  I want her to check with pharmacy, d/w pt.  No chest pain.  Sleeping on the equivalent of about 1 pillow, with adjustable bed.     She has been on abx per ortho but she can't recall the med, for R leg cellulitis.  She has been on med for about 2 weeks, with 1 week remaining with less redness and swelling but not fully healed.  No fevers.    Cramping in the B legs.  Also with more neuropathy pain recently noted from toes up the legs to the mid shin.  She went to [facility] and the inserts helped some in the meantime.  D/w pt.    She is sleeping with O2 at night and used with exertion, but not at rest during the day.  She was 96% today at rest off O2.  She has been down to the 80s on home check off O2.  She feels better with use.    PMH and SH reviewed  Meds, vitals, and allergies reviewed.   ROS: Per HPI.  Unless specifically indicated otherwise in HPI, the patient denies:  General: fever. Eyes: acute vision changes ENT: sore throat Cardiovascular: chest pain Respiratory: SOB GI: vomiting GU: dysuria Musculoskeletal: acute back pain Derm: acute rash Neuro: acute motor dysfunction Psych: worsening mood Endocrine: polydipsia Heme: bleeding Allergy: hayfever  GEN: nad, alert  and oriented, on O2 at baseline.   HEENT: mucous membranes moist NECK: supple w/o LA CV: rrr. PULM: ctab, no inc wob ABD: soft, +bs EXT: trace, R>L BLE edema SKIN: no acute rash but chronic erythema noted on the R lower leg  Diabetic foot exam: Normal inspection No skin breakdown Callus noted on R 1st toe Normal DP pulses Normal sensation to light touch but dec sens to monofilament Nails normal

## 2017-02-09 NOTE — Telephone Encounter (Signed)
Done

## 2017-02-09 NOTE — Telephone Encounter (Signed)
Copied from [place]. Topic: General - Other >> Feb 09, 2017 12:57 PM Scherrie Gerlach wrote: Reason for CRM: pt would like to pick up CD of the xray taken on her knee and both feet when she comes for appt today.  Pt would like it to be ready for pick up when she gets there at 2 pm

## 2017-02-09 NOTE — Patient Instructions (Signed)
Go to the lab on the way out.  We'll contact you with your lab report. We'll be in touch about options re: neuropathy pain after I see your labs.  Take care.  Glad to see you.  Update me as needed.

## 2017-02-10 DIAGNOSIS — H2513 Age-related nuclear cataract, bilateral: Secondary | ICD-10-CM | POA: Diagnosis not present

## 2017-02-10 DIAGNOSIS — H5203 Hypermetropia, bilateral: Secondary | ICD-10-CM | POA: Diagnosis not present

## 2017-02-10 DIAGNOSIS — E119 Type 2 diabetes mellitus without complications: Secondary | ICD-10-CM | POA: Diagnosis not present

## 2017-02-10 DIAGNOSIS — H524 Presbyopia: Secondary | ICD-10-CM | POA: Diagnosis not present

## 2017-02-10 LAB — CBC WITH DIFFERENTIAL/PLATELET
Basophils Absolute: 0.1 10*3/uL (ref 0.0–0.1)
Basophils Relative: 1 % (ref 0.0–3.0)
Eosinophils Absolute: 0.1 10*3/uL (ref 0.0–0.7)
Eosinophils Relative: 2.2 % (ref 0.0–5.0)
HCT: 39.8 % (ref 36.0–46.0)
Hemoglobin: 13 g/dL (ref 12.0–15.0)
Lymphocytes Relative: 22.4 % (ref 12.0–46.0)
Lymphs Abs: 1.5 10*3/uL (ref 0.7–4.0)
MCHC: 32.5 g/dL (ref 30.0–36.0)
MCV: 94.1 fl (ref 78.0–100.0)
Monocytes Absolute: 0.6 10*3/uL (ref 0.1–1.0)
Monocytes Relative: 9 % (ref 3.0–12.0)
Neutro Abs: 4.5 10*3/uL (ref 1.4–7.7)
Neutrophils Relative %: 65.4 % (ref 43.0–77.0)
Platelets: 207 10*3/uL (ref 150.0–400.0)
RBC: 4.23 Mil/uL (ref 3.87–5.11)
RDW: 14.5 % (ref 11.5–15.5)
WBC: 6.8 10*3/uL (ref 4.0–10.5)

## 2017-02-10 LAB — VITAMIN B12: Vitamin B-12: 897 pg/mL (ref 211–911)

## 2017-02-10 LAB — LIPID PANEL
Cholesterol: 135 mg/dL (ref 0–200)
HDL: 60.7 mg/dL (ref >39.00)
LDL Cholesterol: 50 mg/dL (ref 0–99)
NonHDL: 73.87
Total CHOL/HDL Ratio: 2
Triglycerides: 118 mg/dL (ref 0.0–149.0)
VLDL: 23.6 mg/dL (ref 0.0–40.0)

## 2017-02-10 LAB — IBC PANEL
Iron: 66 ug/dL (ref 42–145)
Saturation Ratios: 16.9 % — ABNORMAL LOW (ref 20.0–50.0)
Transferrin: 279 mg/dL (ref 212.0–360.0)

## 2017-02-10 LAB — COMPREHENSIVE METABOLIC PANEL
ALT: 12 U/L (ref 0–35)
AST: 13 U/L (ref 0–37)
Albumin: 4.1 g/dL (ref 3.5–5.2)
Alkaline Phosphatase: 87 U/L (ref 39–117)
BUN: 20 mg/dL (ref 6–23)
CO2: 32 mEq/L (ref 19–32)
Calcium: 9.5 mg/dL (ref 8.4–10.5)
Chloride: 102 mEq/L (ref 96–112)
Creatinine, Ser: 0.85 mg/dL (ref 0.40–1.20)
GFR: 68.49 mL/min (ref >60.00)
Glucose, Bld: 84 mg/dL (ref 70–99)
Potassium: 5.4 mEq/L — ABNORMAL HIGH (ref 3.5–5.1)
Sodium: 140 mEq/L (ref 135–145)
Total Bilirubin: 0.5 mg/dL (ref 0.2–1.2)
Total Protein: 7.2 g/dL (ref 6.0–8.3)

## 2017-02-10 LAB — TSH: TSH: 0.8 u[IU]/mL (ref 0.35–4.50)

## 2017-02-10 LAB — HEMOGLOBIN A1C: Hgb A1c MFr Bld: 6 % (ref 4.6–6.5)

## 2017-02-10 LAB — HM DIABETES EYE EXAM

## 2017-02-10 NOTE — Assessment & Plan Note (Signed)
Would continue abx per outside clinic.  She has made some improvement in the meantime.

## 2017-02-10 NOTE — Assessment & Plan Note (Signed)
Okay to continue baseline meds, see notes on labs.  D/w pt about prev valsartan concerns.  See above.

## 2017-02-10 NOTE — Assessment & Plan Note (Signed)
See notes on labs. We'll be in touch about options re: neuropathy pain after I see her labs.  She agrees.  No change in meds at this point.

## 2017-02-10 NOTE — Assessment & Plan Note (Signed)
Recheck labs pending.  See notes on labs. 

## 2017-02-10 NOTE — Assessment & Plan Note (Signed)
Flu 2018 Shingles 2007 PNA up to date Tetanus 2018 Colonoscopy 2017 Mammogram 02/2016. D/w pt, repeat pending.   dxa 2017 Advance directive- husband and daughter Adah Salvage equally designated if patient were incapacitated.  Cognitive function addressed- see scanned forms- and if abnormal then additional documentation follows.

## 2017-02-10 NOTE — Assessment & Plan Note (Signed)
Advance directive- husband and daughter Adah Salvage equally designated if patient were incapacitated.

## 2017-02-10 NOTE — Assessment & Plan Note (Signed)
Continue O2 and baseline meds as is.  D/w pt.  She agreees.

## 2017-02-10 NOTE — Assessment & Plan Note (Addendum)
She has had a lot of stressors this year, d/w pt.  Still on SSRI witih PRN use of BZD, with routine BZD cautions d/w pt.  The current rxs help some with anxiety sx.  She needed refill done on SSRI, done at [facility].  Per patient, we had denied the refill. She had prev refill for 1 year supply done in 2018.  I have no evidence of SSRI request coming to the clinic for [place] to address, d/w pt.  We didn't "short" here.  She hasn't run out of SSRI yet.  Would continue as is.   >25 minutes spent in face to face time with patient, >50% spent in counselling or coordination of care, discussing dxs other than AMW concerns.

## 2017-02-11 DIAGNOSIS — Z1231 Encounter for screening mammogram for malignant neoplasm of breast: Secondary | ICD-10-CM | POA: Diagnosis not present

## 2017-02-11 LAB — HM MAMMOGRAPHY

## 2017-02-14 ENCOUNTER — Ambulatory Visit: Payer: BLUE CROSS/BLUE SHIELD | Admitting: Internal Medicine

## 2017-02-17 DIAGNOSIS — L570 Actinic keratosis: Secondary | ICD-10-CM | POA: Diagnosis not present

## 2017-02-17 DIAGNOSIS — D485 Neoplasm of uncertain behavior of skin: Secondary | ICD-10-CM | POA: Diagnosis not present

## 2017-02-17 DIAGNOSIS — Z85828 Personal history of other malignant neoplasm of skin: Secondary | ICD-10-CM | POA: Diagnosis not present

## 2017-02-17 DIAGNOSIS — D225 Melanocytic nevi of trunk: Secondary | ICD-10-CM | POA: Diagnosis not present

## 2017-02-17 DIAGNOSIS — I872 Venous insufficiency (chronic) (peripheral): Secondary | ICD-10-CM | POA: Diagnosis not present

## 2017-02-17 DIAGNOSIS — Z23 Encounter for immunization: Secondary | ICD-10-CM | POA: Diagnosis not present

## 2017-02-17 DIAGNOSIS — L814 Other melanin hyperpigmentation: Secondary | ICD-10-CM | POA: Diagnosis not present

## 2017-02-21 ENCOUNTER — Encounter: Payer: Self-pay | Admitting: Internal Medicine

## 2017-02-21 ENCOUNTER — Ambulatory Visit (INDEPENDENT_AMBULATORY_CARE_PROVIDER_SITE_OTHER): Payer: BLUE CROSS/BLUE SHIELD | Admitting: Internal Medicine

## 2017-02-21 VITALS — BP 120/82 | HR 94 | Ht 65.0 in | Wt 240.2 lb

## 2017-02-21 DIAGNOSIS — E1140 Type 2 diabetes mellitus with diabetic neuropathy, unspecified: Secondary | ICD-10-CM

## 2017-02-21 DIAGNOSIS — E039 Hypothyroidism, unspecified: Secondary | ICD-10-CM | POA: Diagnosis not present

## 2017-02-21 MED ORDER — GLUCOSE BLOOD VI STRP: ORAL_STRIP | 5 refills | Status: DC

## 2017-02-21 MED ORDER — METFORMIN HCL 500 MG PO TABS
500.0000 mg | ORAL_TABLET | Freq: Every day | ORAL | 3 refills | Status: DC
Start: 2017-02-21 — End: 2018-04-27

## 2017-02-21 NOTE — Progress Notes (Signed)
Patient ID: Alexandria [name], female   DOB: 1935/09/15, 81 y.o.   MRN: [id number]  HPI: Alexandria [name] is a 81 y.o.-year-old female, returning for follow-up for DM2, dx in 2006, non-insulin-dependent, controlled, with complications (mild CKD) and hypothyroidism. Last visit 6 mo ago.  She fell in 09/2015 >> hurt R leg (had many stitches) and R orbit. She was in rehab afterwards.  DM2: Last hemoglobin A1c was: Lab Results  Component Value Date   HGBA1C 6.0 02/09/2017   HGBA1C 5.8 08/17/2016   HGBA1C 5.6 04/08/2016   Pt is on: - Metformin 500 mg with supper. She was on Januvia >> stopped in summer 2016, when she was admitted for sepsis  Pt checks her sugars 1x a day: - am: 126-147, 157 >> 131-137 >> 127-145 >> 96-125, 143, 172 - 2h after brunch: 156-184 >> 120-186, 221 >> 241 >> 124-195  - before dinner: 103 >> n/c >> 100-151 >> n/c >> 132-155 - 2h after dinner: 106-173, 188 >> 114-120 >> 167, 198 >> 135-188, 336 (fruit cake) - bedtime:  163-193, 226 >> 124, 147 >> n/c - nighttime: n/c >> 169, 175 >> n/c Lowest sugar was 124 >> 96; she has hypoglycemia awareness at 70.  Highest sugar was 241 >> 336.  Glucometer:One Touch Ultra mini  Pt's meals are: - Breakfast: protein drink, egg, cereals - Lunch: sandwich - Dinner: meat + 2 veggies - Snacks: 1-3 peanut butter, milk, crackers  - + mild CKD, last BUN/creatinine:  Lab Results  Component Value Date   BUN 20 02/09/2017   CREATININE 0.85 02/09/2017  On Losartan. - + HL; last set of lipids: Lab Results  Component Value Date   CHOL 135 02/09/2017   HDL 60.70 02/09/2017   LDLCALC 50 02/09/2017   LDLDIRECT 63.0 07/29/2015   TRIG 118.0 02/09/2017   CHOLHDL 2 02/09/2017  On Crestor, Zetia. - last eye exam was: 02/2017 >> No DR - no numbness and tingling in her feet.  She also has a h/o hypothyroidism.  Pt is on Synthroid 125 mcg daily, taken: - in am - fasting  - at least 30 min from b'fast - no Ca, Fe, MVI - now PPIs  and iron moved >4h after LT4 - since last visit - not on Biotin  Last TSH reviewed >> normal: Lab Results  Component Value Date   TSH 0.80 02/09/2017   She has a h/o COPD - Dr. Halford Chessman, HL, HTN, anemia, GERD.  ROS: Constitutional: no weight gain/no weight loss, + fatigue, no subjective hyperthermia, no subjective hypothermia Eyes: no blurry vision, no xerophthalmia ENT: no sore throat, no nodules palpated in throat, no dysphagia, no odynophagia, no hoarseness Cardiovascular: no CP/no SOB/no palpitations/no leg swelling Respiratory: no cough/no SOB/no wheezing Gastrointestinal: no N/no V/no D/no C/no acid reflux Musculoskeletal: no muscle aches/no joint aches Skin: no rashes, no hair loss Neurological: no tremors/no numbness/no tingling/no dizziness  I reviewed pt's medications, allergies, PMH, social hx, family hx, and changes were documented in the history of present illness. Otherwise, unchanged from my initial visit note.  Past Medical History:  Diagnosis Date  . Allergy, unspecified not elsewhere classified   . Chronic diastolic congestive heart failure ([place])   . COPD (chronic obstructive pulmonary disease) ([place]) 03/08/1998   PFTs 12/12/2002 FEV 1 1.42 (64%) ratio 58 with no better after B2 and DLCO75%; PFTs 11/20/09 FEV1 1.50 (73%) ratio 50 no better after B2 with DLCO 62%; Hfa 50% 11/20/2009 >75%, 01/13/10 p coaching  . Depression  12/07/1998  . Diabetes mellitus type II 02/05/2006   Dr. Cruzita Lederer with endo  . Disorders of bursae and tendons in shoulder region, unspecified    Rotator cuff syndrome, right  . E. coli bacteremia   . Esophagitis   . GERD (gastroesophageal reflux disease)   . History of UTI   . Hyperlipidemia 10/04/2000  . Hypertension 03/08/1992  . Hypothyroidism 03/08/1968  . Iron deficiency anemia   . Obesity    NOS  . OSA (obstructive sleep apnea)    PSG 01/27/10 AHI 13, pt does not know CPAP settings  . Peripheral neuropathy    Likely due to DM per Dr. Erling Cruz   . Ventral hernia    Past Surgical History:  Procedure Laterality Date  . ABD U/S  03/19/1999   Nml x2 foci in liver  . ADENOSINE MYOVIEW  06/02/2007   Nml  . CARDIOLITE PERSANTINE  08/24/2000   Nml  . CAROTID U/S  08/24/2000   1-39% ICA stenosis  . CAROTID U/S  06/02/2007   No apprec change   . CARPAL TUNNEL RELEASE  12/1997   Right  . CESAREAN SECTION     x2 Breech/ repeat  . CHOLECYSTECTOMY  1997  . COLONOSCOPY WITH PROPOFOL N/A 02/12/2016   Procedure: COLONOSCOPY WITH PROPOFOL;  Surgeon: Milus Banister, MD;  Location: [facility];  Service: Endoscopy;  Laterality: N/A;  . CT ABD W & PELVIS WO/W CM     Abd hemangiomas of liver, 1 cm R renal cyst  . DENTAL SURGERY  2016   Implants  . DEXA  07/03/2003   Nml  . ESOPHAGOGASTRODUODENOSCOPY  12/05/1997   Nml (due to hoarseness)  . ESOPHAGOGASTRODUODENOSCOPY (EGD) WITH PROPOFOL N/A 02/12/2016   Procedure: ESOPHAGOGASTRODUODENOSCOPY (EGD) WITH PROPOFOL;  Surgeon: Milus Banister, MD;  Location: [facility];  Service: Endoscopy;  Laterality: N/A;  . GALLBLADDER SURGERY    . HERNIA REPAIR  01/24/2009   Lap Ventr w/ Lysis of adhesions (Dr. Donne Hazel)  . knee arthroscopic surgery  years ago   right  . ROTATOR CUFF REPAIR  1984   Right, [place]  . SHOULDER OPEN ROTATOR CUFF REPAIR  02/08/2012   Procedure: ROTATOR CUFF REPAIR SHOULDER OPEN;  Surgeon: Magnus Sinning, MD;  Location: [facility];  Service: Orthopedics;  Laterality: Left;  Left Shoulder Open Anterior Acrominectomy Rotator Cuff Repair Open Distal Clavicle Resection ,tissue mend graft, and repair of biceps tendon  . THUMB RELEASE  12/1997   Right  . TONSILLECTOMY    . TOTAL ABDOMINAL HYSTERECTOMY  1985   Due to dysmennorhea  . US ECHOCARDIOGRAPHY  06/02/2007   Social History   Occupational History  . Retired - self employed- Engineer, structural    Social History Main Topics  . Smoking status: Former Smoker -- 2.00 packs/day for 50 years    Types: Cigarettes     Quit date: 2007  . Smokeless tobacco: Never Used  . Alcohol Use: 1.2 oz/week    2 Standard drinks or equivalent per week     Comment: occasional  . Drug Use: No  . Sexual Activity: Not on file   Social History Narrative   Married with 2 children   Enjoys painting   Current Outpatient Medications on File Prior to Visit  Medication Sig Dispense Refill  . cephALEXin (KEFLEX) 500 MG capsule     . Coenzyme Q-10 200 MG CAPS Take 200 mg by mouth daily.     Marland Kitchen EPINEPHrine (EPIPEN) 0.3 mg/0.3 mL DEVI  Inject 0.3 mg into the muscle once as needed (anaphylaxis).     Marland Kitchen esomeprazole (NEXIUM) 40 MG capsule Take 40 mg by mouth daily at 12 noon.    . ezetimibe (ZETIA) 10 MG tablet Take 1 tablet (10 mg total) by mouth at bedtime.    . ferrous sulfate 325 (65 FE) MG EC tablet Take 1 tablet (325 mg total) by mouth daily with breakfast.    . fexofenadine (ALLEGRA) 180 MG tablet Take 180 mg by mouth as needed.     Marland Kitchen FLUoxetine (PROZAC) 10 MG capsule TAKE 3 CAPSULES IN THE MORNING AND TAKE 3 CAPSULES IN THE EVENING 540 capsule 3  . fluticasone (FLONASE) 50 MCG/ACT nasal spray USE 2 SPRAYS INTO THE NOSE EVERY 12 HOURS AS NEEDED FOR STUFFY NOSE 48 g 2  . glucose blood (ONETOUCH VERIO) test strip Use as instructed to check sugar 1 time daily 100 each 12  . ibuprofen (ADVIL,MOTRIN) 200 MG tablet Take 200-400 mg by mouth every 6 (six) hours as needed for moderate pain.     Marland Kitchen LORazepam (ATIVAN) 0.5 MG tablet TAKE 1 TABLET BY MOUTH TWICE DAILY AS NEEDED. 180 tablet 0  . metFORMIN (GLUCOPHAGE) 500 MG tablet Take 1 tablet (500 mg total) by mouth daily with supper. 90 tablet 3  . nitroGLYCERIN (NITROSTAT) 0.4 MG SL tablet Place 1 tablet (0.4 mg total) under the tongue every 5 (five) minutes as needed for chest pain. 25 tablet 3  . ONETOUCH DELICA LANCETS FINE MISC Use to check sugar 1 time daily 100 each 5  . PROAIR HFA 108 (90 Base) MCG/ACT inhaler INHALE 2 PUFFS INTO THE LUNGS EVERY 6 HOURS AS NEEDED FOR WHEEZING  OR SHORTNESS OF BREATH 8.5 Inhaler 2  . rosuvastatin (CRESTOR) 10 MG tablet TAKE 1 TABLET (10 MG TOTAL) BY MOUTH EVERY OTHER DAY. 90 tablet 1  . SYMBICORT 160-4.5 MCG/ACT inhaler INHALE 2 PUFFS FIRST THING IN THE MORNING AND 2 PUFFS AGAIN IN THE EVENING ABOUT 12 HOURS LATER 30.6 Inhaler 2  . SYNTHROID 125 MCG tablet TAKE 1 TABLET (125 MCG TOTAL) BY MOUTH DAILY BEFORE BREAKFAST. 90 tablet 1  . valsartan (DIOVAN) 160 MG tablet Take 1 tablet (160 mg total) by mouth 2 (two) times daily. 180 tablet 3  . vitamin B-12 (CYANOCOBALAMIN) 50 MCG tablet Take 50 mcg by mouth daily.     . furosemide (LASIX) 20 MG tablet Take 1 tablet (20 mg total) by mouth daily as needed. (Patient taking differently: Take 20 mg by mouth daily as needed for fluid. ) 90 tablet 3  . potassium chloride (K-DUR) 10 MEQ tablet Take 1 tablet (10 mEq total) by mouth daily as needed. (Patient taking differently: Take 10 mEq by mouth daily as needed (Takes with furosemide doses.). ) 90 tablet 3   No current facility-administered medications on file prior to visit.    Allergies  Allergen Reactions  . Antihistamines, Diphenhydramine-Type Other (See Comments)    Able to tolerate only allegra.   . Sulfonamide Derivatives Swelling    REACTION: closed throat  . Incruse Ellipta [Umeclidinium Bromide] Cough    Caused voice change and severe coughing  . Clarithromycin Other (See Comments)    REACTION: diff swallowing and mouth blisters  . Codeine Other (See Comments)    Unknown   . Lyrica [Pregabalin] Other (See Comments)    Lack of effect for neuropathy pain.    Marland Kitchen Spiriva Handihaler [Tiotropium Bromide Monohydrate] Other (See Comments)    Voice changes  Family History  Problem Relation Age of Onset  . Heart disease Mother   . Thyroid disease Mother   . Emphysema Father        One lung  . Cystic fibrosis Sister   . Hyperthyroidism Sister   . Osteoarthritis Brother   . Hyperthyroidism Brother   . Esophageal cancer Neg Hx   .  Cancer Neg Hx        Head or neck  . Colon cancer Neg Hx   . Stomach cancer Neg Hx   . Breast cancer Neg Hx    PE: BP 120/82   Pulse 94   Ht 5\' 5"  (1.651 m)   Wt 240 lb 3.2 oz (109 kg)   SpO2 98%   BMI 39.97 kg/m  Body mass index is 39.97 kg/m.  Wt Readings from Last 3 Encounters:  02/21/17 240 lb 3.2 oz (109 kg)  02/09/17 241 lb (109.3 kg)  12/03/16 240 lb 8 oz (109.1 kg)   Constitutional: overweight, in NAD, on O2, walks with walker Eyes: PERRLA, EOMI, no exophthalmos ENT: moist mucous membranes, no thyromegaly, no cervical lymphadenopathy Cardiovascular: tachycardia, RR, No RG, +2/6 SEM Respiratory: CTA B Gastrointestinal: abdomen soft, NT, ND, BS+ Musculoskeletal: no deformities, strength intact in all 4 Skin: moist, warm, no rashes Neurological: + tremors with outstretched hands, DTR normal in all 4  ASSESSMENT: 1. DM2, non-insulin-dependent, controlled, with complications - mild CKD  2. Hypothyroidism  PLAN:  1. Patient with long standing, controlled DM, on Metformin low dose only, with most sugars at goal, but some hyperglycemic spikes 2/2 dietary indiscretions. One spike: in the 300s (cake). She was previously on Januvia, stopped during a hospitalization for sepsis in 2016. No need to restart now. Also, I do not feel the need to increase her metformin, but we discussed about reducing the intake of concentrated sweets. - last HbA1c from the beginning of the month: higher, at 6.0% but still at goal - continue checking sugars at different times of the day - check 1x a day, rotating checks - advised for yearly eye exams >> she is not UTD - Return to clinic in 4 mo with sugar log   2. Hypothyroidism - latest thyroid labs reviewed with pt >> normal few days ago - she continues on LT4 DAW 125 mcg daily - pt feels good on this dose. - we discussed about taking the thyroid hormone every day, with water, >30 minutes before breakfast, separated by >4 hours from acid  reflux medications, calcium, iron, multivitamins. Pt. is now taking it correctly.  Philemon Kingdom, MD PhD [facility] Endocrinology

## 2017-02-21 NOTE — Patient Instructions (Signed)
Please continue Synthroid 125 mcg daily.  Take the thyroid hormone every day, with water, at least 30 minutes before breakfast, separated by at least 4 hours from: - acid reflux medications - calcium - iron - multivitamins  Check sugars 1x a day, before meals.  Please continue: - Metformin 500 mg with supper  Please return in 4 months with your sugar log.

## 2017-02-22 ENCOUNTER — Encounter: Payer: Self-pay | Admitting: Family Medicine

## 2017-04-27 ENCOUNTER — Other Ambulatory Visit: Payer: Self-pay | Admitting: Family Medicine

## 2017-04-27 NOTE — Telephone Encounter (Signed)
Sent. Thanks.   

## 2017-04-27 NOTE — Telephone Encounter (Signed)
Electronic refill request Last refill 02/04/17 #180 Last office visit 02/09/17

## 2017-06-13 ENCOUNTER — Telehealth: Payer: Self-pay | Admitting: Family Medicine

## 2017-06-13 MED ORDER — OSELTAMIVIR PHOSPHATE 75 MG PO CAPS: 75.0000 mg | ORAL_CAPSULE | Freq: Every day | ORAL | 0 refills | Status: DC

## 2017-06-13 NOTE — Telephone Encounter (Signed)
Caller Understands Yes PreDisposition Go to Urgent Care/Walk-In Clinic PLEASE NOTE: All timestamps contained within this report are represented as [place] Standard Time. CONFIDENTIALTY NOTICE: This fax transmission is intended only for the addressee. It contains information that is legally privileged, confidential or otherwise protected from use or disclosure. If you are not the intended recipient, you are strictly prohibited from reviewing, disclosing, copying using or disseminating any of this information or taking any action in reliance on or regarding this information. If you have received this fax in error, please notify us immediately by telephone so that we can arrange for its return to [place]. Phone: [phone], Toll-Free: [phone], Fax: [phone] Page: 2 of 2 Call Id: [id number] Care Advice Given Per Guideline CALL PCP WITHIN 24 HOURS: You need to discuss this with your doctor within the next 24 hours. REASSURANCE AND EDUCATION: * Although you were exposed to flu, you do not have any symptoms. * Symptoms usually develop within 1-4 days of exposure to another person with flu (7 days is an outer limit). INFLUENZA - PREVENTION WITH VACCINE: * The best way to prevent flu is to get a flu vaccine every year. CALL BACK IF: * You have more questions. CARE ADVICE given per INFLUENZA EXPOSURE (Adult) guideline. Comments User: Mayme Genta, RN Date/Time (Eastern Time): 06/12/2017 11:25:19 AM Instructed pt based of nursing knowledge she has up to 48 hours to start tamiflu if she would like she can call the office when it's open tomorrow morning or if she isn't comfortable with that she can go to an UC or Walk-in clinic to be seen. Denies any current symptoms.

## 2017-06-13 NOTE — Telephone Encounter (Signed)
Patient Name: Alexandria [name] Gender: Female DOB: [birth year] Age: 82 Y 99 M 10 D Return Phone Number: [phone] (Primary), [phone] (Secondary) Address[place] [address] Client [place] Night - Client Client Site [place] Physician Renford Dills - MD Contact Type Call Who Is Calling Patient / Member / Family / Caregiver Call Type Triage / Clinical Relationship To Patient Self Return Phone Number [phone] (Primary) Chief Complaint Prescription Refill or Medication Request (non symptomatic) Reason for Call Symptomatic / Request for [place] she has been exposed to the flu, is a COPD and would like to have preventative meds called in. Translation No Nurse Assessment Nurse: Thurmond Butts, RN, Kirke Shaggy Date/Time (Eastern Time): 06/12/2017 11:21:20 AM Confirm and document reason for call. If symptomatic, describe symptoms. ---Caller states she's on oxygen, has COPD, was with grandson and he was diagnosed with Flu type A. Denies any current symptoms. Does the patient have any new or worsening symptoms? ---No Guidelines Guideline Title Affirmed Question Affirmed Notes Nurse Date/Time (Eastern Time) Influenza Exposure [1] Influenza EXPOSURE (Close Contact) within last 72 hours (3 days) AND [2] exposed person is HIGH RISK (e.g., age > 7 years, pregnant, HIV+, chronic medical condition) Thurmond Butts, RN, Kirke Shaggy 06/12/2017 11:26:17 AM Disp. Time [name] Time) Disposition Final User 06/12/2017 11:27:28 AM Call PCP within 24 Hours Yes Thurmond Butts, RN, Starleen Blue Disagree/Comply Comply

## 2017-06-13 NOTE — Telephone Encounter (Signed)
Patient advised.

## 2017-06-13 NOTE — Telephone Encounter (Signed)
Copied from [place] [phone]. Topic: General - Other >> Jun 13, 2017 10:39 AM Darl Householder, RMA wrote: Reason for CRM: Patient is requesting a prescription for Tamiflu to be sent to [facility], due to patient's grandson has been diagnosed with FLU part A, please return pt call

## 2017-06-13 NOTE — Telephone Encounter (Signed)
Start tamiflu daily.  If flu sx, then start taking BID.  rx sent to [facility]. Update me as needed.  Thanks.

## 2017-06-23 ENCOUNTER — Encounter: Payer: Self-pay | Admitting: Internal Medicine

## 2017-06-23 ENCOUNTER — Ambulatory Visit (INDEPENDENT_AMBULATORY_CARE_PROVIDER_SITE_OTHER): Payer: BLUE CROSS/BLUE SHIELD | Admitting: Internal Medicine

## 2017-06-23 VITALS — BP 118/72 | HR 82 | Ht 65.0 in | Wt 245.4 lb

## 2017-06-23 DIAGNOSIS — E1140 Type 2 diabetes mellitus with diabetic neuropathy, unspecified: Secondary | ICD-10-CM

## 2017-06-23 DIAGNOSIS — E039 Hypothyroidism, unspecified: Secondary | ICD-10-CM

## 2017-06-23 LAB — POCT GLYCOSYLATED HEMOGLOBIN (HGB A1C): Hemoglobin A1C: 5.7

## 2017-06-23 NOTE — Progress Notes (Signed)
Patient ID: Alexandria [name], female   DOB: 13-Oct-1935, 82 y.o.   MRN: [id number]  HPI: Alexandria [name] is a 82 y.o.-year-old female, returning for follow-up for DM2, dx in 2006, non-insulin-dependent, controlled, with complications (mild CKD) and hypothyroidism. Last visit 4 mo ago.  DM2: Last hemoglobin A1c was: Lab Results  Component Value Date   HGBA1C 6.0 02/09/2017   HGBA1C 5.8 08/17/2016   HGBA1C 5.6 04/08/2016   Pt is on: - Metformin 500 mg with dinner She was on Januvia >> stopped in summer 2016, when she was admitted for sepsis. We did not restart afterwards as her sugars remained controlled.  Pt checks her sugars once a day: - am: 127-145 >> 96-125, 143, 172 >> 109-135, 146 - 2h after brunch:  241 >> 124-195 >> n/c - before dinner:n/c >> 132-155 >> n/c - 2h after dinner: 167, 198 >> 135-188, 336 (fruit cake) >> 138 - bedtime:  163-193, 226 >> 124, 147 >> n/c - nighttime: n/c >> 169, 175 >> n/c Lowest sugar was 124 >> 96 >> 71; she has hypoglycemia awareness in the 60s. Highest sugar was 241 >> 336 >> 186 (starch).  Glucometer:One Touch Ultra mini  Pt's meals are: - Breakfast: protein drink, egg, cereals - Lunch: sandwich - Dinner: meat + 2 veggies - Snacks: 1-3 peanut butter, milk, crackers  -+ Mild CKD, last BUN/creatinine:  Lab Results  Component Value Date   BUN 20 02/09/2017   CREATININE 0.85 02/09/2017  On valsartan. -+ HL; last set of lipids: Lab Results  Component Value Date   CHOL 135 02/09/2017   HDL 60.70 02/09/2017   LDLCALC 50 02/09/2017   LDLDIRECT 63.0 07/29/2015   TRIG 118.0 02/09/2017   CHOLHDL 2 02/09/2017  On Crestor qod, Zetia. - last eye exam was: 02/2017: No DR -No numbness and tingling in her feet.  She also has a h/o hypothyroidism.  Pt is on Synthroid 125 mcg daily, taken: - in am - fasting - at least 60 min from b'fast - no Ca, PPIs - +  Fe, MVI  - 3h after - not on Biotin  Last TSH reviewed: normal: Lab Results   Component Value Date   TSH 0.80 02/09/2017   She has a h/o COPD - Dr. Halford Chessman, HL, HTN, anemia, GERD. She fell in 09/2015 >> hurt R leg (had many stitches) and R orbit. She was in rehab afterwards.  ROS: Constitutional: + weight gain/no weight loss, + fatigue, no subjective hyperthermia, no subjective hypothermia, + nocturia Eyes: no blurry vision, no xerophthalmia ENT: no sore throat, + see HPI Cardiovascular: no CP/no SOB/no palpitations+ leg swelling Respiratory: no cough/no SOB/no wheezing Gastrointestinal: no N/no V/no D/no C/no acid reflux Musculoskeletal: no muscle aches/no joint aches Skin: no rashes, + hair loss Neurological: no tremors/no numbness/no tingling/no dizziness  I reviewed pt's medications, allergies, PMH, social hx, family hx, and changes were documented in the history of present illness. Otherwise, unchanged from my initial visit note.  Past Medical History:  Diagnosis Date  . Allergy, unspecified not elsewhere classified   . Chronic diastolic congestive heart failure ([place])   . COPD (chronic obstructive pulmonary disease) ([place]) 03/08/1998   PFTs 12/12/2002 FEV 1 1.42 (64%) ratio 58 with no better after B2 and DLCO75%; PFTs 11/20/09 FEV1 1.50 (73%) ratio 50 no better after B2 with DLCO 62%; Hfa 50% 11/20/2009 >75%, 01/13/10 p coaching  . Depression 12/07/1998  . Diabetes mellitus type II 02/05/2006   Dr. Cruzita Lederer with endo  .  Disorders of bursae and tendons in shoulder region, unspecified    Rotator cuff syndrome, right  . E. coli bacteremia   . Esophagitis   . GERD (gastroesophageal reflux disease)   . History of UTI   . Hyperlipidemia 10/04/2000  . Hypertension 03/08/1992  . Hypothyroidism 03/08/1968  . Iron deficiency anemia   . Obesity    NOS  . OSA (obstructive sleep apnea)    PSG 01/27/10 AHI 13, pt does not know CPAP settings  . Peripheral neuropathy    Likely due to DM per Dr. Erling Cruz  . Ventral hernia    Past Surgical History:  Procedure Laterality  Date  . ABD U/S  03/19/1999   Nml x2 foci in liver  . ADENOSINE MYOVIEW  06/02/2007   Nml  . CARDIOLITE PERSANTINE  08/24/2000   Nml  . CAROTID U/S  08/24/2000   1-39% ICA stenosis  . CAROTID U/S  06/02/2007   No apprec change   . CARPAL TUNNEL RELEASE  12/1997   Right  . CESAREAN SECTION     x2 Breech/ repeat  . CHOLECYSTECTOMY  1997  . COLONOSCOPY WITH PROPOFOL N/A 02/12/2016   Procedure: COLONOSCOPY WITH PROPOFOL;  Surgeon: Milus Banister, MD;  Location: [facility];  Service: Endoscopy;  Laterality: N/A;  . CT ABD W & PELVIS WO/W CM     Abd hemangiomas of liver, 1 cm R renal cyst  . DENTAL SURGERY  2016   Implants  . DEXA  07/03/2003   Nml  . ESOPHAGOGASTRODUODENOSCOPY  12/05/1997   Nml (due to hoarseness)  . ESOPHAGOGASTRODUODENOSCOPY (EGD) WITH PROPOFOL N/A 02/12/2016   Procedure: ESOPHAGOGASTRODUODENOSCOPY (EGD) WITH PROPOFOL;  Surgeon: Milus Banister, MD;  Location: [facility];  Service: Endoscopy;  Laterality: N/A;  . GALLBLADDER SURGERY    . HERNIA REPAIR  01/24/2009   Lap Ventr w/ Lysis of adhesions (Dr. Donne Hazel)  . knee arthroscopic surgery  years ago   right  . ROTATOR CUFF REPAIR  1984   Right, [place]  . SHOULDER OPEN ROTATOR CUFF REPAIR  02/08/2012   Procedure: ROTATOR CUFF REPAIR SHOULDER OPEN;  Surgeon: Magnus Sinning, MD;  Location: [facility];  Service: Orthopedics;  Laterality: Left;  Left Shoulder Open Anterior Acrominectomy Rotator Cuff Repair Open Distal Clavicle Resection ,tissue mend graft, and repair of biceps tendon  . THUMB RELEASE  12/1997   Right  . TONSILLECTOMY    . TOTAL ABDOMINAL HYSTERECTOMY  1985   Due to dysmennorhea  . US ECHOCARDIOGRAPHY  06/02/2007   Social History   Occupational History  . Retired - self employed- Engineer, structural    Social History Main Topics  . Smoking status: Former Smoker -- 2.00 packs/day for 50 years    Types: Cigarettes    Quit date: 2007  . Smokeless tobacco: Never Used  . Alcohol Use:  1.2 oz/week    2 Standard drinks or equivalent per week     Comment: occasional  . Drug Use: No  . Sexual Activity: Not on file   Social History Narrative   Married with 2 children   Enjoys painting   Current Outpatient Medications on File Prior to Visit  Medication Sig Dispense Refill  . cephALEXin (KEFLEX) 500 MG capsule     . Coenzyme Q-10 200 MG CAPS Take 200 mg by mouth daily.     Marland Kitchen EPINEPHrine (EPIPEN) 0.3 mg/0.3 mL DEVI Inject 0.3 mg into the muscle once as needed (anaphylaxis).     Marland Kitchen esomeprazole (  NEXIUM) 40 MG capsule Take 40 mg by mouth daily at 12 noon.    . ezetimibe (ZETIA) 10 MG tablet Take 1 tablet (10 mg total) by mouth at bedtime.    . ferrous sulfate 325 (65 FE) MG EC tablet Take 1 tablet (325 mg total) by mouth daily with breakfast.    . fexofenadine (ALLEGRA) 180 MG tablet Take 180 mg by mouth as needed.     Marland Kitchen FLUoxetine (PROZAC) 10 MG capsule TAKE 3 CAPSULES IN THE MORNING AND TAKE 3 CAPSULES IN THE EVENING 540 capsule 3  . fluticasone (FLONASE) 50 MCG/ACT nasal spray USE 2 SPRAYS INTO THE NOSE EVERY 12 HOURS AS NEEDED FOR STUFFY NOSE 48 g 2  . furosemide (LASIX) 20 MG tablet Take 1 tablet (20 mg total) by mouth daily as needed. (Patient taking differently: Take 20 mg by mouth daily as needed for fluid. ) 90 tablet 3  . glucose blood (ONETOUCH VERIO) test strip Use as instructed to check sugar 2-3x time daily 200 each 5  . ibuprofen (ADVIL,MOTRIN) 200 MG tablet Take 200-400 mg by mouth every 6 (six) hours as needed for moderate pain.     Marland Kitchen LORazepam (ATIVAN) 0.5 MG tablet TAKE 1 TABLET BY MOUTH TWICE A DAY AS NEEDED 180 tablet 0  . metFORMIN (GLUCOPHAGE) 500 MG tablet Take 1 tablet (500 mg total) by mouth daily with supper. 90 tablet 3  . nitroGLYCERIN (NITROSTAT) 0.4 MG SL tablet Place 1 tablet (0.4 mg total) under the tongue every 5 (five) minutes as needed for chest pain. 25 tablet 3  . ONETOUCH DELICA LANCETS FINE MISC Use to check sugar 1 time daily 100 each 5  .  oseltamivir (TAMIFLU) 75 MG capsule Take 1 capsule (75 mg total) by mouth daily. 10 capsule 0  . potassium chloride (K-DUR) 10 MEQ tablet Take 1 tablet (10 mEq total) by mouth daily as needed. (Patient taking differently: Take 10 mEq by mouth daily as needed (Takes with furosemide doses.). ) 90 tablet 3  . PROAIR HFA 108 (90 Base) MCG/ACT inhaler INHALE 2 PUFFS INTO THE LUNGS EVERY 6 HOURS AS NEEDED FOR WHEEZING OR SHORTNESS OF BREATH 8.5 Inhaler 2  . rosuvastatin (CRESTOR) 10 MG tablet TAKE 1 TABLET (10 MG TOTAL) BY MOUTH EVERY OTHER DAY. 90 tablet 1  . SYMBICORT 160-4.5 MCG/ACT inhaler INHALE 2 PUFFS FIRST THING IN THE MORNING AND 2 PUFFS AGAIN IN THE EVENING ABOUT 12 HOURS LATER 30.6 Inhaler 2  . SYNTHROID 125 MCG tablet TAKE 1 TABLET (125 MCG TOTAL) BY MOUTH DAILY BEFORE BREAKFAST. 90 tablet 1  . valsartan (DIOVAN) 160 MG tablet Take 1 tablet (160 mg total) by mouth 2 (two) times daily. 180 tablet 3  . vitamin B-12 (CYANOCOBALAMIN) 50 MCG tablet Take 50 mcg by mouth daily.      No current facility-administered medications on file prior to visit.    Allergies  Allergen Reactions  . Antihistamines, Diphenhydramine-Type Other (See Comments)    Able to tolerate only allegra.   . Sulfonamide Derivatives Swelling    REACTION: closed throat  . Incruse Ellipta [Umeclidinium Bromide] Cough    Caused voice change and severe coughing  . Clarithromycin Other (See Comments)    REACTION: diff swallowing and mouth blisters  . Codeine Other (See Comments)    Unknown   . Lyrica [Pregabalin] Other (See Comments)    Lack of effect for neuropathy pain.    Marland Kitchen Spiriva Handihaler [Tiotropium Bromide Monohydrate] Other (See Comments)  Voice changes   Family History  Problem Relation Age of Onset  . Heart disease Mother   . Thyroid disease Mother   . Emphysema Father        One lung  . Cystic fibrosis Sister   . Hyperthyroidism Sister   . Osteoarthritis Brother   . Hyperthyroidism Brother   .  Esophageal cancer Neg Hx   . Cancer Neg Hx        Head or neck  . Colon cancer Neg Hx   . Stomach cancer Neg Hx   . Breast cancer Neg Hx    PE: BP 118/72   Pulse 82   Ht 5\' 5"  (1.651 m)   Wt 245 lb 6.4 oz (111.3 kg)   SpO2 95%   BMI 40.84 kg/m  Body mass index is 40.84 kg/m.  Wt Readings from Last 3 Encounters:  06/23/17 245 lb 6.4 oz (111.3 kg)  02/21/17 240 lb 3.2 oz (109 kg)  02/09/17 241 lb (109.3 kg)   Constitutional: overweight, in NAD, on O2, walks with walker Eyes: PERRLA, EOMI, no exophthalmos ENT: moist mucous membranes, no thyromegaly, no cervical lymphadenopathy Cardiovascular: RRR, No MRG Respiratory: CTA B Gastrointestinal: abdomen soft, NT, ND, BS+ Musculoskeletal: no deformities, strength intact in all 4 Skin: moist, warm, no rashes Neurological: + tremor with outstretched hands, DTR normal in all 4  ASSESSMENT: 1. DM2, non-insulin-dependent, controlled, with complications - mild CKD  2. Hypothyroidism  3. Obesity class 3  PLAN:  1. Patient with long-standing, uncontrolled, type 2 diabetes, on low-dose metformin only, with most sugars at goal, but some hyperglycemic spikes due to dietary indiscretions.  She was previously on Januvia, stopped during a hospitalization for sepsis in 2016.  There is no need to restart this now.  Also, I do not feel we need to increase her metformin but we discussed at last visit and again today about improving diet reducing the intake of concentrated sweets. - today, HbA1c is 5.7% (better) - continue checking sugars at different times of the day - check 1x a day, rotating checks - advised for yearly eye exams >> she is UTD - Return to clinic in 6 mo with sugar log   2. Hypothyroidism - latest thyroid labs reviewed with pt >> normal in 02/2017 - she continues on Synthroid DAW 125 mcg daily - pt feels good on this dose, but continues to gain weight - we discussed about taking the thyroid hormone every day, with water, >30  minutes before breakfast, separated by >4 hours from acid reflux medications, calcium, iron, multivitamins. Pt. is taking it correctly.  3. Obesity class 3 -She gained 5 pounds since last visit -She is aware that she needs to change her diet -We will also check her TFTs now to see if she needs an increase in dose of levothyroxine  Philemon Kingdom, MD PhD [facility]

## 2017-06-23 NOTE — Patient Instructions (Signed)
Please continue Synthroid 125 mcg daily.  Take the thyroid hormone every day, with water, at least 30 minutes before breakfast, separated by at least 4 hours from: - acid reflux medications - calcium - iron - multivitamins  Please continue: - Metformin 500 mg with supper  Please return in 6 months with your sugar log.

## 2017-07-21 ENCOUNTER — Telehealth: Payer: Self-pay | Admitting: Family Medicine

## 2017-07-21 ENCOUNTER — Other Ambulatory Visit: Payer: Self-pay

## 2017-07-21 ENCOUNTER — Other Ambulatory Visit: Payer: Self-pay | Admitting: Family Medicine

## 2017-07-21 DIAGNOSIS — R062 Wheezing: Secondary | ICD-10-CM

## 2017-07-21 MED ORDER — ALBUTEROL SULFATE HFA 108 (90 BASE) MCG/ACT IN AERS: INHALATION_SPRAY | RESPIRATORY_TRACT | 0 refills | Status: DC

## 2017-07-21 NOTE — Telephone Encounter (Unsigned)
Copied from [place] [id number]. Topic: Quick Communication - Rx Refill/Question >> Jul 21, 2017 12:45 PM Yvette Rack wrote: Medication: PROAIR HFA 108 (90 Base) MCG/ACT inhaler  Preferred Pharmacy (with phone number or street name): [id number] [place], [address] [place] [address] Phone: [phone] Fax: [phone]  Agent: Please be advised that RX refills may take up to 3 business days. We ask that you follow-up with your pharmacy.

## 2017-07-21 NOTE — Telephone Encounter (Signed)
Electronic refill request. ProAir Last office visit:   02/09/17 Last Filled:     8.5 Inhaler 0 07/21/2017  Patient has just apparently picked up her last RF and future refills are being requested.  Please advise.

## 2017-07-23 NOTE — Telephone Encounter (Signed)
Sent.  If needed frequently, then needs f/u OV.  Thanks.

## 2017-08-02 ENCOUNTER — Other Ambulatory Visit: Payer: Self-pay | Admitting: Family Medicine

## 2017-08-02 ENCOUNTER — Telehealth: Payer: Self-pay | Admitting: Family Medicine

## 2017-08-02 NOTE — Telephone Encounter (Signed)
Copied from [place] [id number]. Topic: Quick Communication - Rx Refill/Question >> Aug 02, 2017  5:34 PM Robina Ade, Helene Kelp D wrote: Medication: albuterol (PROAIR HFA) 108 (90 Base) MCG/ACT inhaler  Has the patient contacted their pharmacy? Yes, but pt needs a 90 supply sent to pharmacy because she wants to return the 30 day supply because of the cost. (Agent: If no, request that the patient contact the pharmacy for the refill.) (Agent: If yes, when and what did the pharmacy advise?)  Preferred Pharmacy (with phone number or street name): [id number] [place], [place]: Please be advised that RX refills may take up to 3 business days. We ask that you follow-up with your pharmacy.

## 2017-08-02 NOTE — Telephone Encounter (Signed)
Last filled 05-09-17 #180 Last OV 02-09-17 No Future OV

## 2017-08-03 ENCOUNTER — Other Ambulatory Visit: Payer: Self-pay | Admitting: Internal Medicine

## 2017-08-03 NOTE — Telephone Encounter (Signed)
Left message for patient to call back-Anastasiya Estell Harpin, RMA

## 2017-08-03 NOTE — Telephone Encounter (Signed)
She isn't due for f/u labs.  She doesn't need labs done.

## 2017-08-03 NOTE — Telephone Encounter (Signed)
LOV  02/09/17 Dr. [name] Last refill 07/21/17  See pt. Request.

## 2017-08-03 NOTE — Telephone Encounter (Signed)
Spoke with patient and made follow up appointment for 09/13/17. Patient was asking if she can get lab work done to go over results during the visit. Last lab work was done in December 2018. Please advise. Thank Edrick Kins, RMA

## 2017-08-03 NOTE — Telephone Encounter (Signed)
Please schedule appointment as instructed. 

## 2017-08-03 NOTE — Telephone Encounter (Signed)
Sent but needs routine f/u re: anxiety.  Please schedule for this summer.  Thanks.

## 2017-08-03 NOTE — Telephone Encounter (Signed)
Appointment Brynda Rim, RMA

## 2017-08-04 ENCOUNTER — Telehealth: Payer: Self-pay | Admitting: Radiology

## 2017-08-04 NOTE — Telephone Encounter (Signed)
LM for patient to call the lab directly

## 2017-08-04 NOTE — Telephone Encounter (Signed)
Spoke with patient informing her of Dr.Duncans note. Understanding verbalized nothing further needed.

## 2017-08-04 NOTE — Telephone Encounter (Signed)
Patient returned my call, she wants to know if anyone needs a lift chair, free. I told her I would ask around and let her know

## 2017-08-09 ENCOUNTER — Ambulatory Visit (INDEPENDENT_AMBULATORY_CARE_PROVIDER_SITE_OTHER): Payer: BLUE CROSS/BLUE SHIELD | Admitting: Family Medicine

## 2017-08-09 ENCOUNTER — Encounter: Payer: Self-pay | Admitting: Family Medicine

## 2017-08-09 VITALS — BP 118/72 | HR 87 | Temp 97.7°F | Ht 65.0 in | Wt 242.8 lb

## 2017-08-09 DIAGNOSIS — F419 Anxiety disorder, unspecified: Secondary | ICD-10-CM

## 2017-08-09 DIAGNOSIS — E611 Iron deficiency: Secondary | ICD-10-CM

## 2017-08-09 DIAGNOSIS — D649 Anemia, unspecified: Secondary | ICD-10-CM | POA: Diagnosis not present

## 2017-08-09 LAB — IBC PANEL
Iron: 80 ug/dL (ref 42–145)
Saturation Ratios: 20.5 % (ref 20.0–50.0)
Transferrin: 279 mg/dL (ref 212.0–360.0)

## 2017-08-09 LAB — CBC WITH DIFFERENTIAL/PLATELET
Basophils Absolute: 0 10*3/uL (ref 0.0–0.1)
Basophils Relative: 0.6 % (ref 0.0–3.0)
Eosinophils Absolute: 0.1 10*3/uL (ref 0.0–0.7)
Eosinophils Relative: 2 % (ref 0.0–5.0)
HCT: 35.4 % — ABNORMAL LOW (ref 36.0–46.0)
Hemoglobin: 11.6 g/dL — ABNORMAL LOW (ref 12.0–15.0)
Lymphocytes Relative: 18.5 % (ref 12.0–46.0)
Lymphs Abs: 1.3 10*3/uL (ref 0.7–4.0)
MCHC: 32.9 g/dL (ref 30.0–36.0)
MCV: 93 fl (ref 78.0–100.0)
Monocytes Absolute: 0.6 10*3/uL (ref 0.1–1.0)
Monocytes Relative: 8.1 % (ref 3.0–12.0)
Neutro Abs: 5.1 10*3/uL (ref 1.4–7.7)
Neutrophils Relative %: 70.8 % (ref 43.0–77.0)
Platelets: 212 10*3/uL (ref 150.0–400.0)
RBC: 3.81 Mil/uL — ABNORMAL LOW (ref 3.87–5.11)
RDW: 15.2 % (ref 11.5–15.5)
WBC: 7.2 10*3/uL (ref 4.0–10.5)

## 2017-08-09 LAB — COMPREHENSIVE METABOLIC PANEL
ALT: 9 U/L (ref 0–35)
AST: 12 U/L (ref 0–37)
Albumin: 4 g/dL (ref 3.5–5.2)
Alkaline Phosphatase: 75 U/L (ref 39–117)
BUN: 22 mg/dL (ref 6–23)
CO2: 32 mEq/L (ref 19–32)
Calcium: 9.2 mg/dL (ref 8.4–10.5)
Chloride: 101 mEq/L (ref 96–112)
Creatinine, Ser: 1.03 mg/dL (ref 0.40–1.20)
GFR: 54.81 mL/min — ABNORMAL LOW (ref >60.00)
Glucose, Bld: 157 mg/dL — ABNORMAL HIGH (ref 70–99)
Potassium: 4.6 mEq/L (ref 3.5–5.1)
Sodium: 141 mEq/L (ref 135–145)
Total Bilirubin: 0.4 mg/dL (ref 0.2–1.2)
Total Protein: 7 g/dL (ref 6.0–8.3)

## 2017-08-09 MED ORDER — LORAZEPAM 0.5 MG PO TABS: 0.5000 mg | ORAL_TABLET | Freq: Four times a day (QID) | ORAL | Status: DC | PRN

## 2017-08-09 MED ORDER — FLUOXETINE HCL 10 MG PO CAPS: ORAL_CAPSULE | ORAL | Status: DC

## 2017-08-09 NOTE — Patient Instructions (Signed)
Go to the lab on the way out.  We'll contact you with your lab report. Try taking 4 fluoxetine in the morning in the evening. Use the lorazepam if needed, but take the least amount possible.  Update me as needed.  Take care.  Glad to see you.

## 2017-08-09 NOTE — Progress Notes (Signed)
She is progressively anxious and fatigued.  She had h/o iron def in the past.  Still on iron at baseline.  Still on 60mg  prozac daily.  Using lorazepam prn.  Taking a dose of lorazepam in the AM and in the PM.  She had occ needed another dose in the daytime, prn for anxiety.  Anxiety has been getting worse for the last year.  She has sig stressors with family business and with extra furniture at home that she needs to clear out.  She is losing some extra help at home.  D/w pt.  No SI/HI.  She is grateful for family support but is still having troubles as above.    She tried lasix for recent BLE edema with some dec in sx.   Meds, vitals, and allergies reviewed.   ROS: Per HPI unless specifically indicated in ROS section   nad ncat On O2 at baseline MMM OP wnl Neck supple, no LA Scant exp wheeze rrr Tearful but regains composure.   Ext with trace BLE edema.

## 2017-08-10 NOTE — Assessment & Plan Note (Signed)
History of low iron in the past.  Recheck labs today.  See notes on labs.

## 2017-08-10 NOTE — Assessment & Plan Note (Addendum)
Discussed with patient about options.  No suicidal or homicidal intent.  Okay for outpatient follow-up.  Reasonable to increase Prozac updated milligrams a day to see if that will have some effect.  She can take benzodiazepine as needed, see orders, routine benzodiazepine cautions given to patient.  She understood.  She is trying to work through the stressors that she has noted on the home front.  She will update me about how she is doing. >25 minutes spent in face to face time with patient, >50% spent in counselling or coordination of care.

## 2017-09-05 ENCOUNTER — Telehealth: Payer: Self-pay

## 2017-09-05 NOTE — Telephone Encounter (Signed)
PLEASE NOTE: All timestamps contained within this report are represented as [place] Standard Time. CONFIDENTIALTY NOTICE: This fax transmission is intended only for the addressee. It contains information that is legally privileged, confidential or otherwise protected from use or disclosure. If you are not the intended recipient, you are strictly prohibited from reviewing, disclosing, copying using or disseminating any of this information or taking any action in reliance on or regarding this information. If you have received this fax in error, please notify us immediately by telephone so that we can arrange for its return to [place]. Phone: [phone], Toll-Free: [phone], Fax: [phone] Page: 1 of 1 Call Id: [id number] [place] Patient Name: Alexandria [name] Gender: Female DOB: 01-Mar-1936 Age: 82 Y 1 D Return Phone Number: [phone] (Primary), [phone] (Secondary) Address[place] [address] Client [place] [facility] - Client Client Site [place] Physician Renford Dills - MD Contact Type Call Who Is Calling Patient / Member / Family / Caregiver Call Type Triage / Clinical Relationship To Patient Self Return Phone Number [phone] (Primary) Chief Complaint Prescription Refill or Medication Request (non symptomatic) Reason for Call Medication Question / Request Initial Comment Caller States she is calling to see if she can get her prescription sent to another Lorazepam .5 mg. The pharmacy that her medication was sent to does not have the mg she needs for her prescription. Translation No Nurse Assessment Nurse: Elissa Hefty, RN, Anderson [place] Date/Time [name] Time): 09/03/2017 3:13:14 PM Confirm and document reason for call. If symptomatic, describe symptoms. ---Caller States she is calling to see if she can get her  prescription sent to another Lorazepam .5 mg. She is doing well, and ahs enough to get her to next week. Informed her to call on Monday to get the script called to a different pharmacy. She verbalized understanding. No need to triage Does the patient have any new or worsening symptoms? ---No Please document clinical information provided and list any resource used. ---see assessment Guidelines Guideline Title Affirmed Question Affirmed Notes Nurse Date/Time (Eastern Time) Disp. Time [name] Time) Disposition Final User 09/03/2017 3:16:28 PM Clinical Call Yes Elissa Hefty, RN, Anderson [place]

## 2017-09-05 NOTE — Telephone Encounter (Signed)
I spoke with Yong Channel at [facility] and was advised pt has # 180 left on refill and Lorazepam 0.5 mg should be in today for pick up. Per DPR left v/m that pt can ck with [facility] [place] after lunch to pick up med.

## 2017-09-13 ENCOUNTER — Ambulatory Visit: Payer: BLUE CROSS/BLUE SHIELD | Admitting: Family Medicine

## 2017-09-16 ENCOUNTER — Ambulatory Visit (INDEPENDENT_AMBULATORY_CARE_PROVIDER_SITE_OTHER): Payer: BLUE CROSS/BLUE SHIELD | Admitting: Family Medicine

## 2017-09-16 ENCOUNTER — Encounter: Payer: Self-pay | Admitting: Family Medicine

## 2017-09-16 VITALS — BP 112/58 | HR 83 | Temp 97.9°F | Ht 65.0 in | Wt 239.5 lb

## 2017-09-16 DIAGNOSIS — F339 Major depressive disorder, recurrent, unspecified: Secondary | ICD-10-CM | POA: Diagnosis not present

## 2017-09-16 DIAGNOSIS — I10 Essential (primary) hypertension: Secondary | ICD-10-CM | POA: Diagnosis not present

## 2017-09-16 MED ORDER — POTASSIUM CHLORIDE ER 10 MEQ PO TBCR: 10.0000 meq | EXTENDED_RELEASE_TABLET | Freq: Every day | ORAL | Status: DC | PRN

## 2017-09-16 MED ORDER — FUROSEMIDE 20 MG PO TABS: 20.0000 mg | ORAL_TABLET | Freq: Every day | ORAL | Status: DC | PRN

## 2017-09-16 MED ORDER — FLUOXETINE HCL 10 MG PO CAPS
ORAL_CAPSULE | ORAL | Status: DC
Start: 2017-09-16 — End: 2018-05-01

## 2017-09-16 NOTE — Progress Notes (Signed)
We talked about options re: partial valsartan recalls.  At this point, her meds are thought to be safe and she wanted to table the issue instead of changing meds at this point.  This is reasonable.  We can choose another ARB in the future if needed.  All questions answered.    Mood d/w pt.  More anxiety, sleeping more, less self care (not showering as often), more tearful, more worried.  Appetite is lower.  She admits to being depressed.  No SI/HI.  She didn't tolerate higher dose of prozac, she cut back to current listed dose.  Has been on SSRI for decades.  We talked about this medication and the fact that it "may have run its course" in terms of benefit for her.  We talked about the rationale for her previous dose increased to see if it would make any difference.  She is using lorazepam as needed in the meantime.    PMH and SH reviewed  ROS: Per HPI unless specifically indicated in ROS section   Meds, vitals, and allergies reviewed.   GEN: nad, alert and oriented, On O2 at baseline.  HEENT: mucous membranes moist NECK: supple w/o LA CV: rrr.  PULM: ctab, no inc wob ABD: soft, +bs EXT: trace BLE edema SKIN: well perfused.

## 2017-09-16 NOTE — Patient Instructions (Addendum)
Don't change your meds for now.  Let me consider options about the prozac and psychiatry.    Consider going to see psychiatry.  Take care.  Glad to see you.

## 2017-09-18 ENCOUNTER — Telehealth: Payer: Self-pay | Admitting: Family Medicine

## 2017-09-18 MED ORDER — BUPROPION HCL 75 MG PO TABS: 75.0000 mg | ORAL_TABLET | Freq: Two times a day (BID) | ORAL | Status: DC

## 2017-09-18 NOTE — Telephone Encounter (Signed)
Notify patient.  I see several options about her mood.  1.  Refer to psychiatry, let me know if she needs a referral.  2.  Continue fluoxetine at 60 mg a day and add on Wellbutrin.  This may significantly help with her depressive symptoms and is commonly done.  This is likely the easiest med change to make.  She would still use Lorazepam as needed.   3.  Slowly taper down on fluoxetine and change to medicine out of SSRI class such as Effexor.  This may end up needing to be done but she may have more difficulty with the dose titration than with option #2 described above.  Let me know how she wants to go with this.  I appreciate the help of all involved. Alexandria [name]

## 2017-09-18 NOTE — Assessment & Plan Note (Signed)
She has a long-standing history of depression and anxiety.  Discussed with patient about options.  Not suicidal.  Not homicidal.  Okay for outpatient follow-up.  She has been on fluoxetine for years.  We tried increasing the dose to see if that would help.  This did not help so she decreased back to her previous dose of 30 mg twice a day.  She is still using lorazepam on an as-needed basis for anxiety.  We talked about options in general.  Her symptoms are not controlled right now but she is still okay for outpatient follow-up.  She can consider over the weekend about psychiatry follow-up for medicine management.  This is reasonable.  The other option would be for me to help her attempt a med change.   >25 minutes spent in face to face time with patient, >50% spent in counselling or coordination of care. See follow up phone note.

## 2017-09-18 NOTE — Assessment & Plan Note (Signed)
We talked about options re: partial valsartan recalls.  At this point, her meds are thought to be safe and she wanted to table the issue instead of changing meds at this point.  This is reasonable.  We can choose another ARB in the future if needed.  All questions answered.

## 2017-09-19 NOTE — Telephone Encounter (Signed)
Left message on patient's voicemail to return call.  CRM created.

## 2017-09-19 NOTE — Telephone Encounter (Signed)
Spoke with daughter and patient who states they would like to get the names of a few psychiatrists so that they can get some information on them and then make a decision.

## 2017-09-19 NOTE — Telephone Encounter (Signed)
Copied from [place] [id number]. Topic: Inquiry >> Sep 19, 2017  1:12 PM Pricilla Handler wrote: Reason for CRM: Patient and her daughter returned Lugene's call. Please call them back at [facility] phone # [phone]. Please call between 1:30pm and 3:30pm.         Thank You!!!

## 2017-09-20 NOTE — Telephone Encounter (Signed)
[name] advised.

## 2017-09-20 NOTE — Telephone Encounter (Signed)
[place] in [place] is [place].  Thanks.

## 2017-09-28 ENCOUNTER — Other Ambulatory Visit: Payer: Self-pay | Admitting: Internal Medicine

## 2017-09-30 ENCOUNTER — Other Ambulatory Visit: Payer: Self-pay | Admitting: *Deleted

## 2017-09-30 MED ORDER — SPACER/AERO CHAMBER MOUTHPIECE MISC: 0 refills | Status: DC

## 2017-10-28 ENCOUNTER — Other Ambulatory Visit: Payer: Self-pay | Admitting: Family Medicine

## 2017-11-17 ENCOUNTER — Other Ambulatory Visit: Payer: Self-pay | Admitting: Family Medicine

## 2017-11-17 NOTE — Telephone Encounter (Signed)
Electronic refill request. Lorazepam Last office visit:   09/16/17 Last Filled:   08/09/2017 Please advise.

## 2017-11-17 NOTE — Telephone Encounter (Signed)
I thought she was going to f/u with psych.  If so, then this med needs to come through that clinic.  If not, then she needs f/u here re: mood.  Let me know.  Thanks.

## 2017-11-20 NOTE — Telephone Encounter (Signed)
Thanks

## 2017-11-23 ENCOUNTER — Other Ambulatory Visit: Payer: Self-pay

## 2017-11-23 MED ORDER — VALSARTAN 160 MG PO TABS: 160.0000 mg | ORAL_TABLET | Freq: Two times a day (BID) | ORAL | 3 refills | Status: DC

## 2017-11-25 ENCOUNTER — Other Ambulatory Visit: Payer: Self-pay | Admitting: *Deleted

## 2017-11-25 MED ORDER — VALSARTAN 160 MG PO TABS: 160.0000 mg | ORAL_TABLET | Freq: Two times a day (BID) | ORAL | 3 refills | Status: DC

## 2017-11-28 ENCOUNTER — Encounter: Payer: Self-pay | Admitting: Family Medicine

## 2017-11-28 ENCOUNTER — Ambulatory Visit (INDEPENDENT_AMBULATORY_CARE_PROVIDER_SITE_OTHER): Payer: BLUE CROSS/BLUE SHIELD | Admitting: Family Medicine

## 2017-11-28 VITALS — BP 122/74 | HR 78 | Temp 98.3°F | Ht 65.0 in | Wt 238.8 lb

## 2017-11-28 DIAGNOSIS — Z23 Encounter for immunization: Secondary | ICD-10-CM | POA: Diagnosis not present

## 2017-11-28 DIAGNOSIS — F339 Major depressive disorder, recurrent, unspecified: Secondary | ICD-10-CM | POA: Diagnosis not present

## 2017-11-28 DIAGNOSIS — J449 Chronic obstructive pulmonary disease, unspecified: Secondary | ICD-10-CM

## 2017-11-28 MED ORDER — LORAZEPAM 0.5 MG PO TABS: 0.5000 mg | ORAL_TABLET | Freq: Two times a day (BID) | ORAL | 0 refills | Status: DC | PRN

## 2017-11-28 NOTE — Progress Notes (Signed)
Flu shot today, d/w pt.    She has rhinorrhea and congestion exacerbated by O2 nasal canula.  She has been using flonase and allegra.  She has a humidifier at home to use on her home tank.   She has pulmonary and endo f/u pending.  I will defer.  Mood d/w pt.  She didn't want to go go to psychiatry previously.  D/w pt.  She has stressors at home.  She initially wanted to continue f/u here re: psych meds.  She didn't have good effect from higher dose of fluoxetine.  She was prev on 30mg  BID but had skipped her PM doses a few times a week.   Discussed her mood and "I just don't give a damn."  She is upset with her daughter, who wanted her to have another housekeeper come into her home.  D/w pt about PRN BZD use.  She can usually do well with QD dosing or less, unless she has sig stressors.    She is safe at home.  D/w pt.  She described her situation as tolerable.    Meds, vitals, and allergies reviewed.   ROS: Per HPI unless specifically indicated in ROS section   GEN: nad, alert and oriented, on O2 at baseline.  HEENT: mucous membranes moist NECK: supple w/o LA CV: rrr. PULM: ctab, no inc wob ABD: soft, +bs EXT: no edema SKIN: Well-perfused She is slightly tangential during the conversation but she can be appropriately redirected.  Her speech is fluent.

## 2017-11-28 NOTE — Patient Instructions (Addendum)
PNA vaccine is up to date.   Shingrix is still out of stock here.  2 doses, 2 months apart.  Flu shot today.  The main point of going to psychiatry would be to get their input on your medication, to see if they have suggestions.    I think this is reasonable to consider and I encourage you to talk to your family about this.   Take care.  Glad to see you.   [place] in [place] is [place].

## 2017-11-29 NOTE — Assessment & Plan Note (Signed)
See after visit summary.  Okay for outpatient follow-up.  No suicidal or homicidal intent.  She initially did not want to go to psychiatry.  We discussed at length.  She then became more receptive to psychiatric follow-up.  At this point, I think it makes sense for her to see psychiatry.  She is going to talk to her family.  I will defer to her at this point.  She agrees with plan.  Continue fluoxetine 30 mg twice a day.  Continue lorazepam as needed for now with routine cautions.  Not sedated. >25 minutes spent in face to face time with patient, >50% spent in counselling or coordination of care.

## 2017-11-29 NOTE — Assessment & Plan Note (Signed)
Unfortunately she likely has some nasal drying related to nasal cannula oxygen use.  She is using a humidifier when possible.  I will defer to pulmonary.

## 2017-12-16 ENCOUNTER — Encounter: Payer: Self-pay | Admitting: Primary Care

## 2017-12-16 ENCOUNTER — Ambulatory Visit (INDEPENDENT_AMBULATORY_CARE_PROVIDER_SITE_OTHER): Payer: BLUE CROSS/BLUE SHIELD | Admitting: Primary Care

## 2017-12-16 VITALS — BP 116/72 | HR 80 | Ht 65.0 in | Wt 236.0 lb

## 2017-12-16 DIAGNOSIS — Z9989 Dependence on other enabling machines and devices: Secondary | ICD-10-CM

## 2017-12-16 DIAGNOSIS — F419 Anxiety disorder, unspecified: Secondary | ICD-10-CM | POA: Diagnosis not present

## 2017-12-16 DIAGNOSIS — J9611 Chronic respiratory failure with hypoxia: Secondary | ICD-10-CM

## 2017-12-16 DIAGNOSIS — J449 Chronic obstructive pulmonary disease, unspecified: Secondary | ICD-10-CM | POA: Diagnosis not present

## 2017-12-16 DIAGNOSIS — G4733 Obstructive sleep apnea (adult) (pediatric): Secondary | ICD-10-CM | POA: Diagnosis not present

## 2017-12-16 DIAGNOSIS — R918 Other nonspecific abnormal finding of lung field: Secondary | ICD-10-CM | POA: Diagnosis not present

## 2017-12-16 MED ORDER — AZELASTINE-FLUTICASONE 137-50 MCG/ACT NA SUSP: 2.0000 | NASAL | 3 refills | Status: DC

## 2017-12-16 NOTE — Progress Notes (Signed)
@Patient  ID: Alexandria [name], female    DOB: 10-20-1935, 82 y.o.   MRN: [id number]  Chief Complaint  Patient presents with  . Follow-up    Referring provider: Tonia Ghent, MD  HPI: 82 year old female, former smoker quit 2006. PMH COPD GOLD II, chronic diastolic CHF, obesity hypoventilation syndrome, OSA on cpap. Patient of Dr. Halford Chessman, last seen 10/26/16.   ONO showed low oxygen level during the night.  Ideally she should use CPAP with 2 liters oxygen, since much of her low oxygen is related to sleep apnea.  If she is not able to use CPAP, then she will need to at least use 2 liters oxygen at night.  She should already have home oxygen set up.   12/18/2017 Patient presents today for 1 year follow-up visit. States that she has not worn her CPAP in over a year, still has machine at home. States that she will start using her cpap again. Unsure how to hook up oxygen to her unit. States that she and her husband go out of town every weekend, ordered an Administrator, arts.   Having a lot of anxiety. Follows with PCP. On prozac and ativan 0.5 twice daily. Has taken xanax in the past with better results. States that her PCP wants her to follow-up with psychiatrist for medication management. She feels that a psychiatrist will not be able to relate to her however after a long discussion she is up to seeing someone for a consult.    Significant testing: ONO with RA 11/01/16 >> test time 6 hrs 43 min.  Average SpO2 89%, low SpO2 67%.  Spent 2 hrs 7 min with SpO2 < 88%.  Allergies  Allergen Reactions  . Antihistamines, Diphenhydramine-Type Other (See Comments)    Able to tolerate only allegra.   . Sulfonamide Derivatives Swelling    REACTION: closed throat  . Incruse Ellipta [Umeclidinium Bromide] Cough    Caused voice change and severe coughing  . Clarithromycin Other (See Comments)    REACTION: diff swallowing and mouth blisters  . Codeine Other (See Comments)    Unknown   . Lyrica  [Pregabalin] Other (See Comments)    Lack of effect for neuropathy pain.    Marland Kitchen Spiriva Handihaler [Tiotropium Bromide Monohydrate] Other (See Comments)    Voice changes    Immunization History  Administered Date(s) Administered  . DT 12/23/2010  . H1N1 05/30/2008  . Influenza Split 12/23/2010, 12/16/2011, 01/08/2013  . Influenza Whole 12/07/1995, 12/07/2005, 01/04/2008, 01/06/2010  . Influenza, High Dose Seasonal PF 01/06/2016, 01/06/2017  . Influenza,inj,Quad PF,6+ Mos 12/26/2013, 12/05/2014, 11/28/2017  . PPD Test 10/02/2014  . Pneumococcal Conjugate-13 08/17/2013  . Pneumococcal Polysaccharide-23 12/06/2000, 03/08/2005, 10/24/2008  . Td 03/08/1993, 10/04/2000, 12/23/2010  . Tdap 10/01/2016  . Zoster 12/07/2005    Past Medical History:  Diagnosis Date  . Allergy, unspecified not elsewhere classified   . Chronic diastolic congestive heart failure ([place])   . COPD (chronic obstructive pulmonary disease) ([place]) 03/08/1998   PFTs 12/12/2002 FEV 1 1.42 (64%) ratio 58 with no better after B2 and DLCO75%; PFTs 11/20/09 FEV1 1.50 (73%) ratio 50 no better after B2 with DLCO 62%; Hfa 50% 11/20/2009 >75%, 01/13/10 p coaching  . Depression 12/07/1998  . Diabetes mellitus type II 02/05/2006   Dr. Cruzita Lederer with endo  . Disorders of bursae and tendons in shoulder region, unspecified    Rotator cuff syndrome, right  . E. coli bacteremia   . Esophagitis   . GERD (gastroesophageal  reflux disease)   . History of UTI   . Hyperlipidemia 10/04/2000  . Hypertension 03/08/1992  . Hypothyroidism 03/08/1968  . Iron deficiency anemia   . Obesity    NOS  . OSA (obstructive sleep apnea)    PSG 01/27/10 AHI 13, pt does not know CPAP settings  . Peripheral neuropathy    Likely due to DM per Dr. Erling Cruz  . Ventral hernia     Tobacco History: Social History   Tobacco Use  Smoking Status Former Smoker  . Packs/day: 2.00  . Years: 50.00  . Pack years: 100.00  . Types: Cigarettes  . Last attempt to  quit: 02/05/2005  . Years since quitting: 12.8  Smokeless Tobacco Never Used   Counseling given: Not Answered   Outpatient Medications Prior to Visit  Medication Sig Dispense Refill  . Coenzyme Q-10 200 MG CAPS Take 200 mg by mouth daily.     Marland Kitchen EPINEPHrine (EPIPEN) 0.3 mg/0.3 mL DEVI Inject 0.3 mg into the muscle once as needed (anaphylaxis).     Marland Kitchen esomeprazole (NEXIUM) 40 MG capsule Take 40 mg by mouth daily at 12 noon.    . ezetimibe (ZETIA) 10 MG tablet Take 1 tablet (10 mg total) by mouth at bedtime.    . ferrous sulfate 325 (65 FE) MG EC tablet Take 1 tablet (325 mg total) by mouth daily with breakfast.    . fexofenadine (ALLEGRA) 180 MG tablet Take 180 mg by mouth as needed.     Marland Kitchen FLUoxetine (PROZAC) 10 MG capsule TAKE 3 CAPSULES IN THE MORNING AND TAKE 3 CAPSULES IN THE EVENING    . fluticasone (FLONASE) 50 MCG/ACT nasal spray USE 2 SPRAYS INTO THE NOSE EVERY 12 HOURS AS NEEDED FOR STUFFY NOSE 48 g 2  . furosemide (LASIX) 20 MG tablet Take 1 tablet (20 mg total) by mouth daily as needed for fluid.    Marland Kitchen glucose blood (ONETOUCH VERIO) test strip Use as instructed to check sugar 2-3x time daily 200 each 5  . ibuprofen (ADVIL,MOTRIN) 200 MG tablet Take 200-400 mg by mouth every 6 (six) hours as needed for moderate pain.     Marland Kitchen LORazepam (ATIVAN) 0.5 MG tablet Take 1 tablet (0.5 mg total) by mouth 2 (two) times daily as needed for anxiety. 180 tablet 0  . metFORMIN (GLUCOPHAGE) 500 MG tablet Take 1 tablet (500 mg total) by mouth daily with supper. 90 tablet 3  . nitroGLYCERIN (NITROSTAT) 0.4 MG SL tablet Place 1 tablet (0.4 mg total) under the tongue every 5 (five) minutes as needed for chest pain. 25 tablet 3  . ONETOUCH DELICA LANCETS FINE MISC USE TO CHECK SUGAR 1 TIME DAILY 100 each 5  . potassium chloride (K-DUR) 10 MEQ tablet Take 1 tablet (10 mEq total) by mouth daily as needed (Takes with furosemide doses.).    Marland Kitchen PROAIR HFA 108 (90 Base) MCG/ACT inhaler INHALE 2 PUFFS INTO THE LUNGS  EVERY 6 HOURS AS NEEDED FOR WHEEZING OR SHORTNESS OF BREATH 8.5 Inhaler 2  . rosuvastatin (CRESTOR) 10 MG tablet TAKE 1 TABLET (10 MG TOTAL) BY MOUTH EVERY OTHER DAY. 45 tablet 3  . Spacer/Aero Chamber [employer] Use with  inhaler as needed.  J44.9 1 each 0  . SYMBICORT 160-4.5 MCG/ACT inhaler INHALE 2 PUFFS FIRST THING IN THE MORNING AND 2 PUFFS AGAIN IN THE EVENING ABOUT 12 HOURS LATER 30.6 Inhaler 2  . SYNTHROID 125 MCG tablet TAKE 1 TABLET (125 MCG TOTAL) BY MOUTH DAILY BEFORE BREAKFAST.  90 tablet 1  . valsartan (DIOVAN) 160 MG tablet Take 1 tablet (160 mg total) by mouth 2 (two) times daily. 180 tablet 3  . vitamin B-12 (CYANOCOBALAMIN) 50 MCG tablet Take 50 mcg by mouth daily.      No facility-administered medications prior to visit.     Review of Systems  Review of Systems  Constitutional: Negative.   HENT: Negative.   Respiratory: Negative.   Cardiovascular: Negative.   Psychiatric/Behavioral: Positive for sleep disturbance. The patient is nervous/anxious.     Physical Exam  BP 116/72 (BP Location: Left Arm, Cuff Size: Normal)   Pulse 80   Ht 5\' 5"  (1.651 m)   Wt 236 lb (107 kg)   SpO2 94%   BMI 39.27 kg/m  Physical Exam  Constitutional: She is oriented to person, place, and time. She appears well-developed and well-nourished.  HENT:  Head: Normocephalic and atraumatic.  Eyes: Pupils are equal, round, and reactive to light. EOM are normal.  Neck: Normal range of motion. Neck supple.  Cardiovascular: Normal rate, regular rhythm and normal heart sounds.  No murmur heard. Pulmonary/Chest: Effort normal and breath sounds normal. No respiratory distress. She has no wheezes.  Abdominal: Soft. Bowel sounds are normal. There is no tenderness.  Neurological: She is alert and oriented to person, place, and time.  Skin: Skin is warm and dry. No rash noted. No erythema.  Psychiatric: She has a normal mood and affect. Her behavior is normal. Judgment normal.     Lab  Results:  CBC    Component Value Date/Time   WBC 7.2 08/09/2017 1143   RBC 3.81 (L) 08/09/2017 1143   HGB 11.6 (L) 08/09/2017 1143   HCT 35.4 (L) 08/09/2017 1143   PLT 212.0 08/09/2017 1143   MCV 93.0 08/09/2017 1143   MCH 23.1 (L) 10/01/2014 0326   MCHC 32.9 08/09/2017 1143   RDW 15.2 08/09/2017 1143   LYMPHSABS 1.3 08/09/2017 1143   MONOABS 0.6 08/09/2017 1143   EOSABS 0.1 08/09/2017 1143   BASOSABS 0.0 08/09/2017 1143    BMET    Component Value Date/Time   NA 141 08/09/2017 1143   K 4.6 08/09/2017 1143   CL 101 08/09/2017 1143   CO2 32 08/09/2017 1143   GLUCOSE 157 (H) 08/09/2017 1143   BUN 22 08/09/2017 1143   CREATININE 1.03 08/09/2017 1143   CALCIUM 9.2 08/09/2017 1143   GFRNONAA 42 (L) 10/02/2014 0544   GFRAA 48 (L) 10/02/2014 0544    BNP No results found for: BNP  ProBNP    Component Value Date/Time   PROBNP 34.0 08/24/2012 1621    Imaging: No results found.   Assessment & Plan:   OSA on CPAP - Needs to restart CPAP use every night with 2L oxygen - Referral to DME to help set up and for supplies - Download in 4-6 weeks  - FU in 6 months with Dr. Halford Chessman  Anxiety - Experiencing a large amount anxiety  - Continues Prozac and lorazepam 0.5mg  as needed  - Does not like taking Ativan. Prefers xanax, needs to see psychiatry for med management  Psych recommendations ([facility]) Pearson Grippe, MD Norma Fredrickson, MD Noemi Chapel, APMHNP Eino Farber PA-C Ozella Almond, DNP  Abnormal findings on diagnostic imaging of lung Chest CT no contrast  Re: follow up lung nodule   COPD GOLD II - Stable; continues Symbicort   Chronic respiratory failure with hypoxia ([place]) - Stable; O2 sat 94%, using oxygen  - NO  additional requirement   Rhinitis Azelastine nasal spray once daily Use humidifier with nasal canister oxygen     Martyn Ehrich, NP 12/18/2017

## 2017-12-16 NOTE — Patient Instructions (Addendum)
Orders: Chest CT no contrast  Re: follow up lung nodule   Obstructive sleep apnea: Starting using CPAP every night, goal 4-6 hours or more  Download in 6 weeks please   Referral to DME company ([employer]) for cpap supplies and help set up oxygen to be used at night with cpap   Nasal congestion/runny nose: Azelastine nasal spray once daily Use humidifier with nasal canister oxygen  Follow-up: FU in 6 months with Dr. Halford Chessman

## 2017-12-18 ENCOUNTER — Encounter: Payer: Self-pay | Admitting: Primary Care

## 2017-12-18 ENCOUNTER — Telehealth: Payer: Self-pay | Admitting: Primary Care

## 2017-12-18 DIAGNOSIS — R918 Other nonspecific abnormal finding of lung field: Secondary | ICD-10-CM | POA: Insufficient documentation

## 2017-12-18 DIAGNOSIS — J310 Chronic rhinitis: Secondary | ICD-10-CM | POA: Insufficient documentation

## 2017-12-18 NOTE — Assessment & Plan Note (Signed)
-   Stable; continues Symbicort

## 2017-12-18 NOTE — Assessment & Plan Note (Signed)
Chest CT no contrast  Re: follow up lung nodule

## 2017-12-18 NOTE — Assessment & Plan Note (Signed)
Azelastine nasal spray once daily Use humidifier with nasal canister oxygen

## 2017-12-18 NOTE — Assessment & Plan Note (Signed)
-   Needs to restart CPAP use every night with 2L oxygen - Referral to DME to help set up and for supplies - Download in 4-6 weeks  - FU in 6 months with Dr. Halford Chessman

## 2017-12-18 NOTE — Telephone Encounter (Signed)
Please call patient and give her the names of the below psych providers that I think she could benefit from having a consult with  [facility]/ Phone number is [phone]  Pearson Grippe, MD Norma Fredrickson, MD Noemi Chapel, APMHNP Eino Farber PA-C Ozella Almond, DNP

## 2017-12-18 NOTE — Assessment & Plan Note (Signed)
-   Stable; O2 sat 94%, using oxygen  - NO additional requirement

## 2017-12-18 NOTE — Assessment & Plan Note (Signed)
-   Experiencing a large amount anxiety  - Continues Prozac and lorazepam 0.5mg  as needed  - Does not like taking Ativan. Prefers xanax, needs to see psychiatry for med management  Psych recommendations ([facility]) Pearson Grippe, MD Norma Fredrickson, MD Noemi Chapel, APMHNP Eino Farber PA-C Ozella Almond, DNP

## 2017-12-19 ENCOUNTER — Inpatient Hospital Stay: Admission: RE | Admit: 2017-12-19 | Payer: BLUE CROSS/BLUE SHIELD | Source: Ambulatory Visit

## 2017-12-19 NOTE — Telephone Encounter (Signed)
Pt returned call.  I gave her the name and number of the psych/counseling place for her to call for the referral.  Pt expressed understanding. Nothing further needed.

## 2017-12-19 NOTE — Telephone Encounter (Signed)
LMOM to return call for referral to psych/counseling.

## 2017-12-19 NOTE — Progress Notes (Signed)
Reviewed and agree with assessment/plan.   Sheridyn Canino, MD [facility] 03/03/2016, 12:24 PM Pager:  [phone]  

## 2017-12-20 ENCOUNTER — Encounter: Payer: Self-pay | Admitting: Family Medicine

## 2017-12-22 ENCOUNTER — Ambulatory Visit (INDEPENDENT_AMBULATORY_CARE_PROVIDER_SITE_OTHER): Payer: BLUE CROSS/BLUE SHIELD | Admitting: Internal Medicine

## 2017-12-22 ENCOUNTER — Encounter: Payer: Self-pay | Admitting: Internal Medicine

## 2017-12-22 VITALS — BP 122/60 | HR 85 | Ht 65.0 in | Wt 242.0 lb

## 2017-12-22 DIAGNOSIS — E039 Hypothyroidism, unspecified: Secondary | ICD-10-CM

## 2017-12-22 DIAGNOSIS — E1140 Type 2 diabetes mellitus with diabetic neuropathy, unspecified: Secondary | ICD-10-CM

## 2017-12-22 DIAGNOSIS — M1711 Unilateral primary osteoarthritis, right knee: Secondary | ICD-10-CM | POA: Diagnosis not present

## 2017-12-22 DIAGNOSIS — M1712 Unilateral primary osteoarthritis, left knee: Secondary | ICD-10-CM | POA: Diagnosis not present

## 2017-12-22 DIAGNOSIS — M25569 Pain in unspecified knee: Secondary | ICD-10-CM | POA: Diagnosis not present

## 2017-12-22 LAB — POCT GLYCOSYLATED HEMOGLOBIN (HGB A1C): Hemoglobin A1C: 5.4 % (ref 4.0–5.6)

## 2017-12-22 LAB — TSH: TSH: 3.83 u[IU]/mL (ref 0.35–4.50)

## 2017-12-22 LAB — T4, FREE: Free T4: 1.05 ng/dL (ref 0.60–1.60)

## 2017-12-22 NOTE — Patient Instructions (Signed)
Please continue Synthroid 125 mcg daily.  Take the thyroid hormone every day, with water, at least 30 minutes before breakfast, separated by at least 4 hours from: - acid reflux medications - calcium - iron - multivitamins  Please continue: - Metformin 500 mg with supper  Please stop at the lab.  Please return in 6 months with your sugar log.

## 2017-12-22 NOTE — Addendum Note (Signed)
Addended by: Cardell Peach I on: 12/22/2017 02:36 PM   Modules accepted: Orders

## 2017-12-22 NOTE — Progress Notes (Addendum)
Patient ID: Alexandria [name], female   DOB: 1936/01/31, 82 y.o.   MRN: [id number]  HPI: Alexandria [name] is a 82 y.o.-year-old female, returning for follow-up for DM2, dx in 2006, non-insulin-dependent, controlled, with complications (mild CKD) and hypothyroidism. Last visit 6 months ago.  DM2: Last hemoglobin A1c was: Lab Results  Component Value Date   HGBA1C 5.7 06/23/2017   HGBA1C 6.0 02/09/2017   HGBA1C 5.8 08/17/2016   Pt is on: - Metformin 500 mg with dinner She was on Januvia >> stopped in summer 2016, when she was admitted for sepsis. We did not restart afterwards as her sugars remained controlled.  Pt checks her sugars once a day: - am: 96-125, 143, 172 >> 109-135, 146 >> 118, 124 - 2h after brunch:  241 >> 124-195 >> n/c >> 175 - before dinner:n/c >> 132-155 >> n/c >> 114-152 - 2h after dinner: 135-188, 336 (fruit cake) >> 138 >> 138 - bedtime:  163-193, 226 >> 124, 147 >> n/c >> 133 - nighttime: n/c >> 169, 175 >> n/c Lowest sugar was 96 >> 71 >>114; she has hypoglycemia awareness in the 60s. Highest sugar was 336 >> 186 (starch) >> 175 (pie).  Glucometer:One Touch Ultra mini  Pt's meals are: - Breakfast: protein drink, egg, cereals - Lunch: sandwich - Dinner: meat + 2 veggies - Snacks: 1-3 peanut butter, milk, crackers  -+ Mild CKD, last BUN/creatinine:  Lab Results  Component Value Date   BUN 22 08/09/2017   CREATININE 1.03 08/09/2017  On valsartan. -+ HL; last set of lipids: Lab Results  Component Value Date   CHOL 135 02/09/2017   HDL 60.70 02/09/2017   LDLCALC 50 02/09/2017   LDLDIRECT 63.0 07/29/2015   TRIG 118.0 02/09/2017   CHOLHDL 2 02/09/2017  On Crestor every other day, Zetia - last eye exam was: 02/2017: No DR - Denies numbness and tingling in her feet.  Hypothyroidism.  Pt is on Synthroid 125 Mcg daily, taken: - in am - fasting - at least 30 min from b'fast - + Ca at night - + PPIs (Nexium) - + Fe, MVI -4 hours after Synthroid - not  on Biotin  Last TSH normal: Lab Results  Component Value Date   TSH 0.80 02/09/2017   She has a h/o COPD - Dr. Halford Chessman, HL, HTN, anemia, GERD. She fell in 09/2015 >> hurt R leg (had many stitches) and R orbit. She was in rehab afterwards.  ROS: Constitutional: no weight gain/no weight loss, + fatigue, no subjective hyperthermia, no subjective hypothermia, + nocturia Eyes: no blurry vision, no xerophthalmia ENT: no sore throat, + left upper nodule in the neck, no dysphagia, no odynophagia,+ hoarseness Cardiovascular: no CP/no SOB/no palpitations/+ leg swelling Respiratory: no cough/no SOB/no wheezing Gastrointestinal: no N/no V/no D/no C/no acid reflux Musculoskeletal: no muscle aches/+ joint aches Skin: no rashes, no hair loss Neurological: Tremors tremors/no numbness/no tingling/no dizziness, + headache  I reviewed pt's medications, allergies, PMH, social hx, family hx, and changes were documented in the history of present illness. Otherwise, unchanged from my initial visit note.  Past Medical History:  Diagnosis Date  . Allergy, unspecified not elsewhere classified   . Chronic diastolic congestive heart failure ([place])   . COPD (chronic obstructive pulmonary disease) ([place]) 03/08/1998   PFTs 12/12/2002 FEV 1 1.42 (64%) ratio 58 with no better after B2 and DLCO75%; PFTs 11/20/09 FEV1 1.50 (73%) ratio 50 no better after B2 with DLCO 62%; Hfa 50% 11/20/2009 >75%, 01/13/10 p coaching  .  Depression 12/07/1998  . Diabetes mellitus type II 02/05/2006   Dr. Cruzita Lederer with endo  . Disorders of bursae and tendons in shoulder region, unspecified    Rotator cuff syndrome, right  . E. coli bacteremia   . Esophagitis   . GERD (gastroesophageal reflux disease)   . History of UTI   . Hyperlipidemia 10/04/2000  . Hypertension 03/08/1992  . Hypothyroidism 03/08/1968  . Iron deficiency anemia   . Obesity    NOS  . OSA (obstructive sleep apnea)    PSG 01/27/10 AHI 13, pt does not know CPAP settings   . Peripheral neuropathy    Likely due to DM per Dr. Erling Cruz  . Ventral hernia    Past Surgical History:  Procedure Laterality Date  . ABD U/S  03/19/1999   Nml x2 foci in liver  . ADENOSINE MYOVIEW  06/02/2007   Nml  . CARDIOLITE PERSANTINE  08/24/2000   Nml  . CAROTID U/S  08/24/2000   1-39% ICA stenosis  . CAROTID U/S  06/02/2007   No apprec change   . CARPAL TUNNEL RELEASE  12/1997   Right  . CESAREAN SECTION     x2 Breech/ repeat  . CHOLECYSTECTOMY  1997  . COLONOSCOPY WITH PROPOFOL N/A 02/12/2016   Procedure: COLONOSCOPY WITH PROPOFOL;  Surgeon: Milus Banister, MD;  Location: [facility];  Service: Endoscopy;  Laterality: N/A;  . CT ABD W & PELVIS WO/W CM     Abd hemangiomas of liver, 1 cm R renal cyst  . DENTAL SURGERY  2016   Implants  . DEXA  07/03/2003   Nml  . ESOPHAGOGASTRODUODENOSCOPY  12/05/1997   Nml (due to hoarseness)  . ESOPHAGOGASTRODUODENOSCOPY (EGD) WITH PROPOFOL N/A 02/12/2016   Procedure: ESOPHAGOGASTRODUODENOSCOPY (EGD) WITH PROPOFOL;  Surgeon: Milus Banister, MD;  Location: [facility];  Service: Endoscopy;  Laterality: N/A;  . GALLBLADDER SURGERY    . HERNIA REPAIR  01/24/2009   Lap Ventr w/ Lysis of adhesions (Dr. Donne Hazel)  . knee arthroscopic surgery  years ago   right  . ROTATOR CUFF REPAIR  1984   Right, [place]  . SHOULDER OPEN ROTATOR CUFF REPAIR  02/08/2012   Procedure: ROTATOR CUFF REPAIR SHOULDER OPEN;  Surgeon: Magnus Sinning, MD;  Location: [facility];  Service: Orthopedics;  Laterality: Left;  Left Shoulder Open Anterior Acrominectomy Rotator Cuff Repair Open Distal Clavicle Resection ,tissue mend graft, and repair of biceps tendon  . THUMB RELEASE  12/1997   Right  . TONSILLECTOMY    . TOTAL ABDOMINAL HYSTERECTOMY  1985   Due to dysmennorhea  . US ECHOCARDIOGRAPHY  06/02/2007   Social History   Occupational History  . Retired - self employed- Engineer, structural    Social History Main Topics  . Smoking status: Former  Smoker -- 2.00 packs/day for 50 years    Types: Cigarettes    Quit date: 2007  . Smokeless tobacco: Never Used  . Alcohol Use: 1.2 oz/week    2 Standard drinks or equivalent per week     Comment: occasional  . Drug Use: No  . Sexual Activity: Not on file   Social History Narrative   Married with 2 children   Enjoys painting   Current Outpatient Medications on File Prior to Visit  Medication Sig Dispense Refill  . Azelastine-Fluticasone 137-50 MCG/ACT SUSP Place 2 puffs into the nose 1 day or 1 dose for 1 dose. 1 Bottle 3  . Coenzyme Q-10 200 MG CAPS Take 200 mg  by mouth daily.     Marland Kitchen EPINEPHrine (EPIPEN) 0.3 mg/0.3 mL DEVI Inject 0.3 mg into the muscle once as needed (anaphylaxis).     Marland Kitchen esomeprazole (NEXIUM) 40 MG capsule Take 40 mg by mouth daily at 12 noon.    . ezetimibe (ZETIA) 10 MG tablet Take 1 tablet (10 mg total) by mouth at bedtime.    . ferrous sulfate 325 (65 FE) MG EC tablet Take 1 tablet (325 mg total) by mouth daily with breakfast.    . fexofenadine (ALLEGRA) 180 MG tablet Take 180 mg by mouth as needed.     Marland Kitchen FLUoxetine (PROZAC) 10 MG capsule TAKE 3 CAPSULES IN THE MORNING AND TAKE 3 CAPSULES IN THE EVENING    . fluticasone (FLONASE) 50 MCG/ACT nasal spray USE 2 SPRAYS INTO THE NOSE EVERY 12 HOURS AS NEEDED FOR STUFFY NOSE 48 g 2  . furosemide (LASIX) 20 MG tablet Take 1 tablet (20 mg total) by mouth daily as needed for fluid.    Marland Kitchen glucose blood (ONETOUCH VERIO) test strip Use as instructed to check sugar 2-3x time daily 200 each 5  . ibuprofen (ADVIL,MOTRIN) 200 MG tablet Take 200-400 mg by mouth every 6 (six) hours as needed for moderate pain.     Marland Kitchen LORazepam (ATIVAN) 0.5 MG tablet Take 1 tablet (0.5 mg total) by mouth 2 (two) times daily as needed for anxiety. 180 tablet 0  . metFORMIN (GLUCOPHAGE) 500 MG tablet Take 1 tablet (500 mg total) by mouth daily with supper. 90 tablet 3  . nitroGLYCERIN (NITROSTAT) 0.4 MG SL tablet Place 1 tablet (0.4 mg total) under the  tongue every 5 (five) minutes as needed for chest pain. 25 tablet 3  . ONETOUCH DELICA LANCETS FINE MISC USE TO CHECK SUGAR 1 TIME DAILY 100 each 5  . potassium chloride (K-DUR) 10 MEQ tablet Take 1 tablet (10 mEq total) by mouth daily as needed (Takes with furosemide doses.).    Marland Kitchen PROAIR HFA 108 (90 Base) MCG/ACT inhaler INHALE 2 PUFFS INTO THE LUNGS EVERY 6 HOURS AS NEEDED FOR WHEEZING OR SHORTNESS OF BREATH 8.5 Inhaler 2  . rosuvastatin (CRESTOR) 10 MG tablet TAKE 1 TABLET (10 MG TOTAL) BY MOUTH EVERY OTHER DAY. 45 tablet 3  . Spacer/Aero Chamber [employer] Use with  inhaler as needed.  J44.9 1 each 0  . SYMBICORT 160-4.5 MCG/ACT inhaler INHALE 2 PUFFS FIRST THING IN THE MORNING AND 2 PUFFS AGAIN IN THE EVENING ABOUT 12 HOURS LATER 30.6 Inhaler 2  . SYNTHROID 125 MCG tablet TAKE 1 TABLET (125 MCG TOTAL) BY MOUTH DAILY BEFORE BREAKFAST. 90 tablet 1  . valsartan (DIOVAN) 160 MG tablet Take 1 tablet (160 mg total) by mouth 2 (two) times daily. 180 tablet 3  . vitamin B-12 (CYANOCOBALAMIN) 50 MCG tablet Take 50 mcg by mouth daily.      No current facility-administered medications on file prior to visit.    Allergies  Allergen Reactions  . Antihistamines, Diphenhydramine-Type Other (See Comments)    Able to tolerate only allegra.   . Sulfonamide Derivatives Swelling    REACTION: closed throat  . Incruse Ellipta [Umeclidinium Bromide] Cough    Caused voice change and severe coughing  . Clarithromycin Other (See Comments)    REACTION: diff swallowing and mouth blisters  . Codeine Other (See Comments)    Unknown   . Lyrica [Pregabalin] Other (See Comments)    Lack of effect for neuropathy pain.    Marland Kitchen Spiriva Handihaler [Tiotropium  Bromide Monohydrate] Other (See Comments)    Voice changes   Family History  Problem Relation Age of Onset  . Heart disease Mother   . Thyroid disease Mother   . Emphysema Father        One lung  . Cystic fibrosis Sister   . Hyperthyroidism Sister   .  Osteoarthritis Brother   . Hyperthyroidism Brother   . Esophageal cancer Neg Hx   . Cancer Neg Hx        Head or neck  . Colon cancer Neg Hx   . Stomach cancer Neg Hx   . Breast cancer Neg Hx    PE: BP 122/60   Pulse 85   Ht 5\' 5"  (1.651 m) Comment: measured  Wt 242 lb (109.8 kg)   SpO2 96% Comment: on O2  BMI 40.27 kg/m  Body mass index is 40.27 kg/m.  Wt Readings from Last 3 Encounters:  12/22/17 242 lb (109.8 kg)  12/16/17 236 lb (107 kg)  11/28/17 238 lb 12 oz (108.3 kg)   Constitutional: overweight, in NAD, + on oxygen, + walks with walker Eyes: PERRLA, EOMI, no exophthalmos ENT: moist mucous membranes, no thyromegaly, + left submandibular enlarged lymph node, otherwise no cervical lymphadenopathy Cardiovascular: RRR, No RG, +1/6 SEM Respiratory: CTA B Gastrointestinal: abdomen soft, NT, ND, BS+ Musculoskeletal: no deformities, strength intact in all 4 Skin: moist, warm, no rashes Neurological: + Tremor with outstretched hands, DTR normal in all 4  ASSESSMENT: 1. DM2, non-insulin-dependent, controlled, with complications - mild CKD  2. Hypothyroidism  3. Obesity class 3  4. HL  PLAN:  1. Patient with longstanding, now more controlled, type 2 diabetes, on low-dose metformin only, with latest HbA1c is excellent, at 5.7% at last visit.  I am following her on a six-month basis. -She was previously on Januvia, stopped during the hospitalization for sepsis in 2016.  At last visit, there was no need to restart this is most sugars were at goal, with only some hyperglycemic spikes due to dietary indiscretions.  At that time, we did discuss about trying to reduce the intake of concentrated sweets, but no changes were necessary in her regimen.   - At this visit, her sugars are still excellent with the exception of occasional CBGs higher than target after dietary indiscretions.  Her sister that accompanies her today asks about the possibility of starting patient on medications  that help with both diabetes and weight loss.  However, she is requiring now a minimum amount of metformin with excellent sugars, so I do not feel that an SGLT2 inhibitor GLP-1 receptor agonist is indicated. - today, HbA1c is 5.4% (lower) - continue checking sugars at different times of the day - check 1x a day, rotating checks - advised for yearly eye exams >> she is UTD - Return to clinic in 6 mo with sugar log   2. Hypothyroidism - latest thyroid labs reviewed with pt >> normal 02/2017 - she continues on Synthroid DAW 125 mcg daily - pt feels good on this dose. - we discussed about taking the thyroid hormone every day, with water, >30 minutes before breakfast, separated by >4 hours from acid reflux medications, calcium, iron, multivitamins. Pt. is taking it correctly. - will check thyroid tests today: TSH and fT4 - If labs are abnormal, she will need to return for repeat TFTs in 1.5 months  3. Obesity class 3 -She lost a net of 3 pounds since last visit  4. HL - Reviewed latest  lipid panel from 02/2017: all fractions at goal Lab Results  Component Value Date   CHOL 135 02/09/2017   HDL 60.70 02/09/2017   LDLCALC 50 02/09/2017   LDLDIRECT 63.0 07/29/2015   TRIG 118.0 02/09/2017   CHOLHDL 2 02/09/2017  - Continues Crestor qod + Zetia, without side effects. - Has appointment with PCP coming up at the end of the year  Office Visit on 12/22/2017  Component Date Value Ref Range Status  . TSH 12/22/2017 3.83  0.35 - 4.50 uIU/mL Final  . Free T4 12/22/2017 1.05  0.60 - 1.60 ng/dL Final   Comment: Specimens from patients who are undergoing biotin therapy and /or ingesting biotin supplements may contain high levels of biotin.  The higher biotin concentration in these specimens interferes with this Free T4 assay.  Specimens that contain high levels  of biotin may cause false high results for this Free T4 assay.  Please interpret results in light of the total clinical presentation of the  patient.    . Hemoglobin A1C 12/22/2017 5.4  4.0 - 5.6 % Final   Normal TFTs.  Philemon Kingdom, MD PhD [facility]

## 2017-12-23 ENCOUNTER — Telehealth: Payer: Self-pay | Admitting: Pulmonary Disease

## 2017-12-23 ENCOUNTER — Ambulatory Visit (HOSPITAL_COMMUNITY)
Admission: RE | Admit: 2017-12-23 | Discharge: 2017-12-23 | Disposition: A | Discharge status: Home / Self Care | Payer: BLUE CROSS/BLUE SHIELD | Source: Ambulatory Visit | Attending: Primary Care | Admitting: Primary Care

## 2017-12-23 DIAGNOSIS — R918 Other nonspecific abnormal finding of lung field: Secondary | ICD-10-CM | POA: Insufficient documentation

## 2017-12-23 DIAGNOSIS — J439 Emphysema, unspecified: Secondary | ICD-10-CM | POA: Insufficient documentation

## 2017-12-23 DIAGNOSIS — R911 Solitary pulmonary nodule: Secondary | ICD-10-CM | POA: Diagnosis not present

## 2017-12-23 DIAGNOSIS — I700 Atherosclerosis of aorta: Secondary | ICD-10-CM | POA: Diagnosis not present

## 2017-12-23 NOTE — Telephone Encounter (Signed)
Received call report on CT Chest dated 12/23/17 Impression is as follows:  IMPRESSION: 1. Enlarging irregular, ill-defined right upper lobe nodule, now measuring 10 mm, previously 5 mm. Consultation with pulmonary medicine or thoracic surgery is suggested, as clinically appropriate. 2. Other scattered pulmonary nodules in both lungs are unchanged. 3. Emphysema (ICD10-J43.9). 4. Aortic atherosclerosis (ICD10-I70.0).  Forwarding to Derl Barrow, NP and Dr Halford Chessman to be made aware

## 2017-12-26 ENCOUNTER — Inpatient Hospital Stay: Admission: RE | Admit: 2017-12-26 | Payer: BLUE CROSS/BLUE SHIELD | Source: Ambulatory Visit

## 2017-12-26 ENCOUNTER — Telehealth: Payer: Self-pay | Admitting: Primary Care

## 2017-12-27 NOTE — Telephone Encounter (Signed)
Thanks Beth,  I believe she needs a super-D if interested in pursing biopsy. But, its probably worth having her meet me or Dr. Lamonte Sakai in the office to discuss risk/benefit and yield of diagnosis vs observing it. I think that since it is semi-solid/ sub-solid nodule a PET may not be terribly useful as it could be falsely negative. But if it was positive would be helpful. I am sure either of [place] can see her. I believe we are currently scheduling out ENB into November.  Thanks, Leory Plowman

## 2017-12-27 NOTE — Telephone Encounter (Signed)
Hi Dr. Valeta Harms,  This is a [facility] patient that I saw on 10/11 for OSA follow-up and COPD GOLD II. Previous smoker quit 2006.  She had a hx of abnormal CT scan and recommended fu. I ordered CT chest that showed an enlarging irregular ill defined RUL nodule now measuring 53mm (previously 54mm). I could not get image changed to Super-D. Dr. Halford Chessman wanted me to check with either you or Dr. Lamonte Sakai to see if you felt the area would be accessible to bx. If so, would you recommend getting a super-d or PET prior to scheduling EBUS?  Thanks, Derl Barrow, NP-BC

## 2017-12-28 ENCOUNTER — Encounter: Payer: Self-pay | Admitting: Pulmonary Disease

## 2017-12-28 ENCOUNTER — Ambulatory Visit (INDEPENDENT_AMBULATORY_CARE_PROVIDER_SITE_OTHER): Payer: BLUE CROSS/BLUE SHIELD | Admitting: Pulmonary Disease

## 2017-12-28 ENCOUNTER — Telehealth: Payer: Self-pay | Admitting: Family Medicine

## 2017-12-28 VITALS — BP 122/70 | HR 72 | Ht 65.0 in | Wt 234.0 lb

## 2017-12-28 DIAGNOSIS — Z9989 Dependence on other enabling machines and devices: Secondary | ICD-10-CM | POA: Diagnosis not present

## 2017-12-28 DIAGNOSIS — J9611 Chronic respiratory failure with hypoxia: Secondary | ICD-10-CM

## 2017-12-28 DIAGNOSIS — R911 Solitary pulmonary nodule: Secondary | ICD-10-CM | POA: Diagnosis not present

## 2017-12-28 DIAGNOSIS — J449 Chronic obstructive pulmonary disease, unspecified: Secondary | ICD-10-CM | POA: Diagnosis not present

## 2017-12-28 DIAGNOSIS — R918 Other nonspecific abnormal finding of lung field: Secondary | ICD-10-CM

## 2017-12-28 DIAGNOSIS — G4733 Obstructive sleep apnea (adult) (pediatric): Secondary | ICD-10-CM

## 2017-12-28 DIAGNOSIS — J432 Centrilobular emphysema: Secondary | ICD-10-CM

## 2017-12-28 NOTE — Telephone Encounter (Signed)
Pt states that she had a prednisone injection and she believes that this may have her caused her UTI and she would like to ask the nurse. She states if so, she will purchase OTC meds instead of coming in for an appt tomorrow. Please advise.

## 2017-12-28 NOTE — Telephone Encounter (Signed)
If she has sx, then I would advise her to keep the OV.  Not likely to be caused by prednisone.  Thanks.

## 2017-12-28 NOTE — Telephone Encounter (Signed)
Pt has 3:45 appt today at pulmonary and pt wants to know if [name] can work her in. I asked what time she thought she would get thru with pulmonary appt and pt said that would not work because she is going to [employer] after pulmonary appt. I asked pt what time was she wanting to get worked in and pt said now; she could be at office in 10 mins. Pt having burning upon urination and frequency.  I spoke with Dr Josefine Class CMA and Rollene Fare said no available appts at this time. I offered to call other [facility] offices to see if could be seen today and pt said no they would not be able to see her now. Pt said she would keep appt with Dr Darnell Level on 12/29/17 at 10:15 that pt already has scheduled. If pt condition worsens tonight pt will go to [facility]. FYI to [name] and Dr Darnell Level.

## 2017-12-28 NOTE — Telephone Encounter (Signed)
Patient notified as instructed by telephone and verbalized. Patient stated that she has bought some Monistat to try and has already cancelled the appointment. Patient stated that she will call back if this does not work.

## 2017-12-28 NOTE — Patient Instructions (Addendum)
Thank you for visiting Dr. Valeta Harms at [facility]. Today we recommend the following: Orders Placed This Encounter  Procedures  . NM PET Image Initial (PI) Skull Base To Thigh    Return in about 3 weeks (around 01/18/2018). After PET Scan.   We are moving our office in November. The new address will be: [address] [employer] [address] Phone: [phone]

## 2017-12-28 NOTE — Telephone Encounter (Signed)
Please call patient and have her set up an office visit to go over recent Chest CT results with either Dr. Valeta Harms or Dr. Lamonte Sakai. Thanks

## 2017-12-28 NOTE — Progress Notes (Signed)
Synopsis: Referred in October 2019 for nodule evaluation by Dr. Ricarda Frame, NP  Subjective:   PATIENT ID: Alexandria [name] GENDER: female DOB: Oct 06, 1935, MRN: [id number]  Chief Complaint  Patient presents with  . Follow-up    Needs to discuss CT results.     Past medical history of chronic diastolic heart failure, COPD Gold 4, FEV1 approximately 1 L in 2016, chronic hypoxemic respiratory failure on 2 L nasal cannula.  Patient is routinely followed by Dr. Halford Chessman in the pulmonary and sleep clinic.  Patient has a diagnosis of OSA and was placed on CPAP in the past.  She has not been using her CPAP for some time as recently restarted on this after being seen in clinic.  At the time review of imaging revealed a small lung nodule from imaging that was completed in April 2018.  Follow-up CT scan completed October 2019 revealed an enlarging right upper lobe lung nodule.  In 2018 the lung nodule was approximately 5 mm in size and now is 10 mm in size.  She does have associated centrilobular emphysema and there is some mild spiculation to the upper lobe lesion.  Patient denies any current symptoms besides exertional dyspnea and shortness of breath with any significant exercise or activity level.  Of note, the patient grew up in [facility].  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  They eventually transitioned to a new business here in the greater [place] area.  They have lived here for many years.  He is currently a construction/developer in the area.  He is still working at the age of 82.  Overall they are doing well and have been married for almost 83 years.    Past Medical History:  Diagnosis Date  . Allergy, unspecified not elsewhere classified   . Chronic diastolic congestive heart failure ([place])   . COPD (chronic obstructive pulmonary disease) ([place]) 03/08/1998   PFTs 12/12/2002 FEV 1 1.42 (64%) ratio 58 with no better after B2 and DLCO75%; PFTs 11/20/09 FEV1 1.50 (73%) ratio 50 no  better after B2 with DLCO 62%; Hfa 50% 11/20/2009 >75%, 01/13/10 p coaching  . Depression 12/07/1998  . Diabetes mellitus type II 02/05/2006   Dr. Cruzita Lederer with endo  . Disorders of bursae and tendons in shoulder region, unspecified    Rotator cuff syndrome, right  . E. coli bacteremia   . Esophagitis   . GERD (gastroesophageal reflux disease)   . History of UTI   . Hyperlipidemia 10/04/2000  . Hypertension 03/08/1992  . Hypothyroidism 03/08/1968  . Iron deficiency anemia   . Obesity    NOS  . OSA (obstructive sleep apnea)    PSG 01/27/10 AHI 13, pt does not know CPAP settings  . Peripheral neuropathy    Likely due to DM per Dr. Erling Cruz  . Ventral hernia      Family History  Problem Relation Age of Onset  . Heart disease Mother   . Thyroid disease Mother   . Emphysema Father        One lung  . Cystic fibrosis Sister   . Hyperthyroidism Sister   . Osteoarthritis Brother   . Hyperthyroidism Brother   . Esophageal cancer Neg Hx   . Cancer Neg Hx        Head or neck  . Colon cancer Neg Hx   . Stomach cancer Neg Hx   . Breast cancer Neg Hx      Past Surgical History:  Procedure Laterality Date  .  ABD U/S  03/19/1999   Nml x2 foci in liver  . ADENOSINE MYOVIEW  06/02/2007   Nml  . CARDIOLITE PERSANTINE  08/24/2000   Nml  . CAROTID U/S  08/24/2000   1-39% ICA stenosis  . CAROTID U/S  06/02/2007   No apprec change   . CARPAL TUNNEL RELEASE  12/1997   Right  . CESAREAN SECTION     x2 Breech/ repeat  . CHOLECYSTECTOMY  1997  . COLONOSCOPY WITH PROPOFOL N/A 02/12/2016   Procedure: COLONOSCOPY WITH PROPOFOL;  Surgeon: Milus Banister, MD;  Location: [facility];  Service: Endoscopy;  Laterality: N/A;  . CT ABD W & PELVIS WO/W CM     Abd hemangiomas of liver, 1 cm R renal cyst  . DENTAL SURGERY  2016   Implants  . DEXA  07/03/2003   Nml  . ESOPHAGOGASTRODUODENOSCOPY  12/05/1997   Nml (due to hoarseness)  . ESOPHAGOGASTRODUODENOSCOPY (EGD) WITH PROPOFOL N/A 02/12/2016     Procedure: ESOPHAGOGASTRODUODENOSCOPY (EGD) WITH PROPOFOL;  Surgeon: Milus Banister, MD;  Location: [facility];  Service: Endoscopy;  Laterality: N/A;  . GALLBLADDER SURGERY    . HERNIA REPAIR  01/24/2009   Lap Ventr w/ Lysis of adhesions (Dr. Donne Hazel)  . knee arthroscopic surgery  years ago   right  . ROTATOR CUFF REPAIR  1984   Right, [place]  . SHOULDER OPEN ROTATOR CUFF REPAIR  02/08/2012   Procedure: ROTATOR CUFF REPAIR SHOULDER OPEN;  Surgeon: Magnus Sinning, MD;  Location: [facility];  Service: Orthopedics;  Laterality: Left;  Left Shoulder Open Anterior Acrominectomy Rotator Cuff Repair Open Distal Clavicle Resection ,tissue mend graft, and repair of biceps tendon  . THUMB RELEASE  12/1997   Right  . TONSILLECTOMY    . TOTAL ABDOMINAL HYSTERECTOMY  1985   Due to dysmennorhea  . US ECHOCARDIOGRAPHY  06/02/2007    Social History   Socioeconomic History  . Marital status: Married    Spouse name: Not on file  . Number of children: 2  . Years of education: Not on file  . Highest education level: Not on file  Occupational History  . Occupation: Retired    Fish farm manager: RETIRED  Social Needs  . Financial resource strain: Not on file  . Food insecurity:    Worry: Not on file    Inability: Not on file  . Transportation needs:    Medical: Not on file    Non-medical: Not on file  Tobacco Use  . Smoking status: Former Smoker    Packs/day: 2.00    Years: 50.00    Pack years: 100.00    Types: Cigarettes    Last attempt to quit: 02/05/2005    Years since quitting: 12.9  . Smokeless tobacco: Never Used  Substance and Sexual Activity  . Alcohol use: No    Alcohol/week: 2.0 standard drinks    Types: 2 Standard drinks or equivalent per week    Frequency: Never  . Drug use: Yes    Comment: beer occassionally  . Sexual activity: Not Currently  Lifestyle  . Physical activity:    Days per week: Not on file    Minutes per session: Not on file  . Stress: Not on file   Relationships  . Social connections:    Talks on phone: Not on file    Gets together: Not on file    Attends religious service: Not on file    Active member of club or organization: Not on file  Attends meetings of clubs or organizations: Not on file    Relationship status: Not on file  . Intimate partner violence:    Fear of current or ex partner: Not on file    Emotionally abused: Not on file    Physically abused: Not on file    Forced sexual activity: Not on file  Other Topics Concern  . Not on file  Social History Narrative   Married with 2 children   Enjoys painting     Allergies  Allergen Reactions  . Antihistamines, Diphenhydramine-Type Other (See Comments)    Able to tolerate only allegra.   . Sulfonamide Derivatives Swelling    REACTION: closed throat  . Incruse Ellipta [Umeclidinium Bromide] Cough    Caused voice change and severe coughing  . Clarithromycin Other (See Comments)    REACTION: diff swallowing and mouth blisters  . Codeine Other (See Comments)    Unknown   . Lyrica [Pregabalin] Other (See Comments)    Lack of effect for neuropathy pain.    Marland Kitchen Spiriva Handihaler [Tiotropium Bromide Monohydrate] Other (See Comments)    Voice changes     Outpatient Medications Prior to Visit  Medication Sig Dispense Refill  . Coenzyme Q-10 200 MG CAPS Take 200 mg by mouth daily.     Marland Kitchen EPINEPHrine (EPIPEN) 0.3 mg/0.3 mL DEVI Inject 0.3 mg into the muscle once as needed (anaphylaxis).     Marland Kitchen esomeprazole (NEXIUM) 40 MG capsule Take 40 mg by mouth daily at 12 noon.    . ezetimibe (ZETIA) 10 MG tablet Take 1 tablet (10 mg total) by mouth at bedtime.    . ferrous sulfate 325 (65 FE) MG EC tablet Take 1 tablet (325 mg total) by mouth daily with breakfast.    . fexofenadine (ALLEGRA) 180 MG tablet Take 180 mg by mouth as needed.     Marland Kitchen FLUoxetine (PROZAC) 10 MG capsule TAKE 3 CAPSULES IN THE MORNING AND TAKE 3 CAPSULES IN THE EVENING    . fluticasone (FLONASE) 50 MCG/ACT  nasal spray USE 2 SPRAYS INTO THE NOSE EVERY 12 HOURS AS NEEDED FOR STUFFY NOSE 48 g 2  . furosemide (LASIX) 20 MG tablet Take 1 tablet (20 mg total) by mouth daily as needed for fluid.    Marland Kitchen glucose blood (ONETOUCH VERIO) test strip Use as instructed to check sugar 2-3x time daily 200 each 5  . ibuprofen (ADVIL,MOTRIN) 200 MG tablet Take 200-400 mg by mouth every 6 (six) hours as needed for moderate pain.     Marland Kitchen LORazepam (ATIVAN) 0.5 MG tablet Take 1 tablet (0.5 mg total) by mouth 2 (two) times daily as needed for anxiety. 180 tablet 0  . metFORMIN (GLUCOPHAGE) 500 MG tablet Take 1 tablet (500 mg total) by mouth daily with supper. 90 tablet 3  . nitroGLYCERIN (NITROSTAT) 0.4 MG SL tablet Place 1 tablet (0.4 mg total) under the tongue every 5 (five) minutes as needed for chest pain. 25 tablet 3  . ONETOUCH DELICA LANCETS FINE MISC USE TO CHECK SUGAR 1 TIME DAILY 100 each 5  . potassium chloride (K-DUR) 10 MEQ tablet Take 1 tablet (10 mEq total) by mouth daily as needed (Takes with furosemide doses.).    Marland Kitchen PROAIR HFA 108 (90 Base) MCG/ACT inhaler INHALE 2 PUFFS INTO THE LUNGS EVERY 6 HOURS AS NEEDED FOR WHEEZING OR SHORTNESS OF BREATH 8.5 Inhaler 2  . rosuvastatin (CRESTOR) 10 MG tablet TAKE 1 TABLET (10 MG TOTAL) BY MOUTH EVERY OTHER DAY. [place]  tablet 3  . Spacer/Aero Chamber [employer] Use with  inhaler as needed.  J44.9 1 each 0  . SYMBICORT 160-4.5 MCG/ACT inhaler INHALE 2 PUFFS FIRST THING IN THE MORNING AND 2 PUFFS AGAIN IN THE EVENING ABOUT 12 HOURS LATER 30.6 Inhaler 2  . SYNTHROID 125 MCG tablet TAKE 1 TABLET (125 MCG TOTAL) BY MOUTH DAILY BEFORE BREAKFAST. 90 tablet 1  . valsartan (DIOVAN) 160 MG tablet Take 1 tablet (160 mg total) by mouth 2 (two) times daily. 180 tablet 3  . vitamin B-12 (CYANOCOBALAMIN) 50 MCG tablet Take 50 mcg by mouth daily.     . Azelastine-Fluticasone 137-50 MCG/ACT SUSP Place 2 puffs into the nose 1 day or 1 dose for 1 dose. 1 Bottle 3   No facility-administered  medications prior to visit.     Review of Systems  Constitutional: Negative for chills, fever, malaise/fatigue and weight loss.  HENT: Negative for hearing loss, sore throat and tinnitus.   Eyes: Negative for blurred vision and double vision.  Respiratory: Positive for shortness of breath. Negative for cough, hemoptysis, sputum production, wheezing and stridor.   Cardiovascular: Negative for chest pain, palpitations, orthopnea, leg swelling and PND.  Gastrointestinal: Negative for abdominal pain, constipation, diarrhea, heartburn, nausea and vomiting.  Genitourinary: Negative for dysuria, hematuria and urgency.  Musculoskeletal: Negative for joint pain and myalgias.  Skin: Negative for itching and rash.  Neurological: Negative for dizziness, tingling, weakness and headaches.  Endo/Heme/Allergies: Negative for environmental allergies. Does not bruise/bleed easily.  Psychiatric/Behavioral: Negative for depression. The patient is not nervous/anxious and does not have insomnia.   All other systems reviewed and are negative.    Objective:  Physical Exam  Constitutional: She is oriented to person, place, and time. She appears well-developed and well-nourished. No distress.  HENT:  Head: Normocephalic and atraumatic.  Mouth/Throat: Oropharynx is clear and moist.  Eyes: Pupils are equal, round, and reactive to light. Conjunctivae are normal. No scleral icterus.  Neck: Neck supple. No JVD present. No tracheal deviation present.  Cardiovascular: Normal rate, regular rhythm, normal heart sounds and intact distal pulses.  No murmur heard. Pulmonary/Chest: Effort normal. No accessory muscle usage or stridor. No tachypnea. No respiratory distress. She has no wheezes. She has no rhonchi. She has no rales.  Diminished breath sounds bilaterally, no crackles, no wheeze  Abdominal: Soft. Bowel sounds are normal. She exhibits no distension. There is no tenderness.  Musculoskeletal: She exhibits no edema  or tenderness.  Lymphadenopathy:    She has no cervical adenopathy.  Neurological: She is alert and oriented to person, place, and time.  Skin: Skin is warm and dry. Capillary refill takes less than 2 seconds. No rash noted.  Psychiatric: She has a normal mood and affect. Her behavior is normal.  Vitals reviewed.    Vitals:   12/28/17 1557  BP: 122/70  Pulse: 72  SpO2: 95%  Weight: 234 lb (106.1 kg)  Height: 5\' 5"  (1.651 m)   95% on 2LPM BMI Readings from Last 3 Encounters:  12/28/17 38.94 kg/m  12/22/17 40.27 kg/m  12/16/17 39.27 kg/m   Wt Readings from Last 3 Encounters:  12/28/17 234 lb (106.1 kg)  12/22/17 242 lb (109.8 kg)  12/16/17 236 lb (107 kg)     CBC    Component Value Date/Time   WBC 7.2 08/09/2017 1143   RBC 3.81 (L) 08/09/2017 1143   HGB 11.6 (L) 08/09/2017 1143   HCT 35.4 (L) 08/09/2017 1143   PLT 212.0 08/09/2017 1143  MCV 93.0 08/09/2017 1143   MCH 23.1 (L) 10/01/2014 0326   MCHC 32.9 08/09/2017 1143   RDW 15.2 08/09/2017 1143   LYMPHSABS 1.3 08/09/2017 1143   MONOABS 0.6 08/09/2017 1143   EOSABS 0.1 08/09/2017 1143   BASOSABS 0.0 08/09/2017 1143    Chest Imaging:  Pulmonary Functions Testing Results: PFT Results Latest Ref Rng & Units 08/15/2014  FVC-Pre L 1.92  FVC-Predicted Pre % 70  FVC-Post L 1.95  FVC-Predicted Post % 71  Pre FEV1/FVC % % 53  Post FEV1/FCV % % 55  FEV1-Pre L 1.01  FEV1-Predicted Pre % 49  DLCO UNC% % 47  DLCO COR %Predicted % 70  TLC L 6.49  TLC % Predicted % 128  RV % Predicted % 188     FeNO: None  Pathology: None  Echocardiogram: 2016-preserved ejection fraction 55 to 60%.  Heart Catheterization: None    Assessment & Plan:   Solitary pulmonary nodule - Plan: NM PET Image Initial (PI) Skull Base To Thigh, CANCELED: NM PET Image Initial (PI) Skull Base To Thigh  Abnormal findings on diagnostic imaging of lung  Chronic respiratory failure with hypoxia (HCC)  COPD (chronic obstructive  pulmonary disease) with chronic bronchitis (HCC)  OSA on CPAP  Centrilobular emphysema (HCC)  Discussion:  This is an 82 year old female with a significant history for severe COPD, upper lobe centrilobular emphysema and an enlarging right upper lobe solitary pulmonary nodule.  In 2018 the nodule was 5 mm and now in October 2019 the nodule has nearly doubled in size at 10 mm.  Using the [facility] solitary pulmonary nodule malignancy risk calculator due to the upper lobe predominance of the disease, age, size as well as associated emphysema the patient's risk of potential malignancy associated with this lesion is approximately 50%.  Due to the patient's size of the nodule I believe that obtaining tissue biopsy would be difficult.  Electromagnetic navigational bronchoscopy would offer 1 of the best options at obtaining tissue diagnosis with least risk of pneumothorax over percutaneous access however I suspect the yield for diagnosis would only be approximately 50% due to the size of the nodule.  As for the next best step in diagnostic evaluation of believe we should obtain a nuclear medicine PET scan.  If there was any uptake within the nodule this would increase the chances of malignancy even more.  Depending upon the PET scan we can discuss need for a dedicated super D image to be able to proceed with ENB.  Due to the patient's chronic respiratory failure and severe COPD she is not a surgical candidate for resection.  With that in mind pending the PET scan we could speak with radiation oncology to consider radiation therapy alone in the treatment of the upper lobe lesion based upon imaging and clinical suspicion of malignancy without obtaining a tissue diagnosis.  Return to clinic in 2 to 3 weeks after obtaining pet imaging.  Greater than 50% of this patient's 40-minute office visit was spent face-to-face discussing the diagnostic plan as outlined above.   Current Outpatient Medications:  .   Coenzyme Q-10 200 MG CAPS, Take 200 mg by mouth daily. , Disp: , Rfl:  .  EPINEPHrine (EPIPEN) 0.3 mg/0.3 mL DEVI, Inject 0.3 mg into the muscle once as needed (anaphylaxis). , Disp: , Rfl:  .  esomeprazole (NEXIUM) 40 MG capsule, Take 40 mg by mouth daily at 12 noon., Disp: , Rfl:  .  ezetimibe (ZETIA) 10 MG tablet, Take 1  tablet (10 mg total) by mouth at bedtime., Disp: , Rfl:  .  ferrous sulfate 325 (65 FE) MG EC tablet, Take 1 tablet (325 mg total) by mouth daily with breakfast., Disp: , Rfl:  .  fexofenadine (ALLEGRA) 180 MG tablet, Take 180 mg by mouth as needed. , Disp: , Rfl:  .  FLUoxetine (PROZAC) 10 MG capsule, TAKE 3 CAPSULES IN THE MORNING AND TAKE 3 CAPSULES IN THE EVENING, Disp: , Rfl:  .  fluticasone (FLONASE) 50 MCG/ACT nasal spray, USE 2 SPRAYS INTO THE NOSE EVERY 12 HOURS AS NEEDED FOR STUFFY NOSE, Disp: 48 g, Rfl: 2 .  furosemide (LASIX) 20 MG tablet, Take 1 tablet (20 mg total) by mouth daily as needed for fluid., Disp: , Rfl:  .  glucose blood (ONETOUCH VERIO) test strip, Use as instructed to check sugar 2-3x time daily, Disp: 200 each, Rfl: 5 .  ibuprofen (ADVIL,MOTRIN) 200 MG tablet, Take 200-400 mg by mouth every 6 (six) hours as needed for moderate pain. , Disp: , Rfl:  .  LORazepam (ATIVAN) 0.5 MG tablet, Take 1 tablet (0.5 mg total) by mouth 2 (two) times daily as needed for anxiety., Disp: 180 tablet, Rfl: 0 .  metFORMIN (GLUCOPHAGE) 500 MG tablet, Take 1 tablet (500 mg total) by mouth daily with supper., Disp: 90 tablet, Rfl: 3 .  nitroGLYCERIN (NITROSTAT) 0.4 MG SL tablet, Place 1 tablet (0.4 mg total) under the tongue every 5 (five) minutes as needed for chest pain., Disp: 25 tablet, Rfl: 3 .  ONETOUCH DELICA LANCETS FINE MISC, USE TO CHECK SUGAR 1 TIME DAILY, Disp: 100 each, Rfl: 5 .  potassium chloride (K-DUR) 10 MEQ tablet, Take 1 tablet (10 mEq total) by mouth daily as needed (Takes with furosemide doses.)., Disp: , Rfl:  .  PROAIR HFA 108 (90 Base) MCG/ACT  inhaler, INHALE 2 PUFFS INTO THE LUNGS EVERY 6 HOURS AS NEEDED FOR WHEEZING OR SHORTNESS OF BREATH, Disp: 8.5 Inhaler, Rfl: 2 .  rosuvastatin (CRESTOR) 10 MG tablet, TAKE 1 TABLET (10 MG TOTAL) BY MOUTH EVERY OTHER DAY., Disp: 45 tablet, Rfl: 3 .  Spacer/Aero Chamber Mouthpiece MISC, Use with  inhaler as needed.  J44.9, Disp: 1 each, Rfl: 0 .  SYMBICORT 160-4.5 MCG/ACT inhaler, INHALE 2 PUFFS FIRST THING IN THE MORNING AND 2 PUFFS AGAIN IN THE EVENING ABOUT 12 HOURS LATER, Disp: 30.6 Inhaler, Rfl: 2 .  SYNTHROID 125 MCG tablet, TAKE 1 TABLET (125 MCG TOTAL) BY MOUTH DAILY BEFORE BREAKFAST., Disp: 90 tablet, Rfl: 1 .  valsartan (DIOVAN) 160 MG tablet, Take 1 tablet (160 mg total) by mouth 2 (two) times daily., Disp: 180 tablet, Rfl: 3 .  vitamin B-12 (CYANOCOBALAMIN) 50 MCG tablet, Take 50 mcg by mouth daily. , Disp: , Rfl:  .  Azelastine-Fluticasone 137-50 MCG/ACT SUSP, Place 2 puffs into the nose 1 day or 1 dose for 1 dose., Disp: 1 Bottle, Rfl: 3   Garner Nash, DO [facility] 12/28/2017 9:31 PM

## 2017-12-28 NOTE — Telephone Encounter (Signed)
Copied from [place] [phone]. Topic: General - Inquiry >> Dec 28, 2017 11:45 AM Margot Ables wrote: Reason for CRM: pt called stating she has a UTI and pops up every year or so. She declined scheduling appt saying she was seen last month and "just have him send it in". She is requesting ABX. Pt stated she does not know which one because this is infrequent.  [id number] [address], [place] [phone] (Phone) [phone] (Fax)

## 2017-12-28 NOTE — Telephone Encounter (Signed)
Called and spoke with patient she has been scheduled to see BI today. Nothing further needed.

## 2017-12-28 NOTE — Telephone Encounter (Signed)
Spoke to pt and advised OV required. Scheduled with Dr Darnell Level 10/24 @ 1015

## 2017-12-29 ENCOUNTER — Other Ambulatory Visit: Payer: Self-pay | Admitting: Family Medicine

## 2017-12-29 ENCOUNTER — Ambulatory Visit: Payer: BLUE CROSS/BLUE SHIELD | Admitting: Family Medicine

## 2017-12-29 DIAGNOSIS — R062 Wheezing: Secondary | ICD-10-CM

## 2017-12-29 NOTE — Telephone Encounter (Signed)
Thanks. Noted.

## 2017-12-29 NOTE — Telephone Encounter (Signed)
Electronic refill request. ProAir HFA Last office visit:   11/28/17  Saw Pulmonary yesterday. Last Filled:    8.5 Inhaler 2 07/23/2017  Is she using this too often?

## 2017-12-30 ENCOUNTER — Ambulatory Visit (INDEPENDENT_AMBULATORY_CARE_PROVIDER_SITE_OTHER): Payer: BLUE CROSS/BLUE SHIELD | Admitting: Family Medicine

## 2017-12-30 ENCOUNTER — Encounter: Payer: Self-pay | Admitting: Family Medicine

## 2017-12-30 VITALS — BP 136/60 | HR 68 | Temp 97.6°F | Ht 65.0 in | Wt 233.2 lb

## 2017-12-30 DIAGNOSIS — R300 Dysuria: Secondary | ICD-10-CM

## 2017-12-30 LAB — POC URINALSYSI DIPSTICK (AUTOMATED)
Bilirubin, UA: NEGATIVE
Glucose, UA: NEGATIVE
Ketones, UA: NEGATIVE
Nitrite, UA: NEGATIVE
Protein, UA: NEGATIVE
Spec Grav, UA: 1.015 (ref 1.010–1.025)
Urobilinogen, UA: 0.2 E.U./dL
pH, UA: 6 (ref 5.0–8.0)

## 2017-12-30 MED ORDER — CEPHALEXIN 500 MG PO CAPS: 500.0000 mg | ORAL_CAPSULE | Freq: Two times a day (BID) | ORAL | 0 refills | Status: DC

## 2017-12-30 MED ORDER — CEPHALEXIN 500 MG PO CAPS: 500.0000 mg | ORAL_CAPSULE | Freq: Four times a day (QID) | ORAL | 0 refills | Status: DC

## 2017-12-30 MED ORDER — POTASSIUM CHLORIDE ER 10 MEQ PO TBCR: 10.0000 meq | EXTENDED_RELEASE_TABLET | Freq: Every day | ORAL | 1 refills | Status: DC | PRN

## 2017-12-30 MED ORDER — FUROSEMIDE 20 MG PO TABS: 20.0000 mg | ORAL_TABLET | Freq: Every day | ORAL | 1 refills | Status: DC | PRN

## 2017-12-30 NOTE — Telephone Encounter (Signed)
Sent. Thanks.   

## 2017-12-30 NOTE — Patient Instructions (Signed)
Drink plenty of water and start the antibiotics today.  We'll contact you with your lab report.  Take care.   

## 2017-12-30 NOTE — Progress Notes (Signed)
Dysuria: yes duration of symptoms: 1 week  abdominal pain: no fevers:no back pain:no vomiting:no  Episodic use of lasix with KDur, not daily.  She takes them together, if at all.  Used x6 in the last month.   We talked about psych eval. I encouraged her to follow through with psychiatry evaluation.  She has seen pulmonary recently.  I'll defer re: nodule w/u.  Meds, vitals, and allergies reviewed.   Per HPI unless specifically indicated in ROS section   GEN: nad, alert and oriented, on O2.  HEENT: mucous membranes moist NECK: supple CV: rrr.  PULM: ctab, no inc wob ABD: soft, +bs, suprapubic area not tender EXT: trace BLE edema BACK: no CVA pain

## 2018-01-01 DIAGNOSIS — R300 Dysuria: Secondary | ICD-10-CM | POA: Insufficient documentation

## 2018-01-01 LAB — URINE CULTURE
MICRO NUMBER:: 91286610
SPECIMEN QUALITY:: ADEQUATE

## 2018-01-01 NOTE — Assessment & Plan Note (Addendum)
Presumed cystitis.  Nontoxic.  Okay for outpatient follow-up.  Encouraged adequate fluid intake.  Start Keflex, 500 mg twice daily for 7 days..  Check urine culture.  See notes on labs.  I will defer to pulmonary and psychiatry otherwise.  She agrees with plan.

## 2018-01-02 ENCOUNTER — Telehealth: Payer: Self-pay | Admitting: Pulmonary Disease

## 2018-01-02 NOTE — Telephone Encounter (Signed)
Last week states that she receive a steroid injection in her  knee and will have another one on thursday, and this week she is taking an antibiotic for a uti she wanted to know if this would effect her upcoming PET scan on 01/06/18 ?   Dr. Valeta Harms please advise

## 2018-01-02 NOTE — Telephone Encounter (Signed)
I have [facility] they did not anwser need to call them back at [phone]

## 2018-01-02 NOTE — Telephone Encounter (Signed)
PCCM:  This question will need to be directed to the nuclear medicine radiology department.   Thanks  Garner Nash, DO [facility] 01/02/2018 4:26 PM

## 2018-01-03 NOTE — Telephone Encounter (Signed)
Called nuclear medicine radiology dept and spoke with Fatima Sanger to let him know that pt is having the steroid injections and is currently on an abx and pt wanted to know if this would affect the PET scan.  Per Fatima Sanger, this would have no affect as they will not be imaging the knees for the scan and with pt being on the abx, it will not affect anything either.  Called and spoke with pt letting her know this information and stated to her that she is still able to get the PET. Pt expressed understanding. Nothing further needed.

## 2018-01-05 DIAGNOSIS — M25561 Pain in right knee: Secondary | ICD-10-CM | POA: Diagnosis not present

## 2018-01-05 DIAGNOSIS — M25562 Pain in left knee: Secondary | ICD-10-CM | POA: Diagnosis not present

## 2018-01-05 DIAGNOSIS — M1711 Unilateral primary osteoarthritis, right knee: Secondary | ICD-10-CM | POA: Diagnosis not present

## 2018-01-05 DIAGNOSIS — M1712 Unilateral primary osteoarthritis, left knee: Secondary | ICD-10-CM | POA: Diagnosis not present

## 2018-01-06 ENCOUNTER — Other Ambulatory Visit: Payer: Self-pay

## 2018-01-06 ENCOUNTER — Encounter (HOSPITAL_COMMUNITY)
Admission: RE | Admit: 2018-01-06 | Discharge: 2018-01-06 | Disposition: A | Discharge status: Home / Self Care | Payer: BLUE CROSS/BLUE SHIELD | Source: Ambulatory Visit | Attending: Pulmonary Disease | Admitting: Pulmonary Disease

## 2018-01-06 DIAGNOSIS — R918 Other nonspecific abnormal finding of lung field: Secondary | ICD-10-CM | POA: Diagnosis not present

## 2018-01-06 DIAGNOSIS — R911 Solitary pulmonary nodule: Secondary | ICD-10-CM | POA: Diagnosis not present

## 2018-01-06 LAB — GLUCOSE, CAPILLARY: Glucose-Capillary: 133 mg/dL — ABNORMAL HIGH (ref 70–99)

## 2018-01-06 MED ORDER — FLUDEOXYGLUCOSE F - 18 (FDG) INJECTION
11.4000 | Freq: Once | INTRAVENOUS | Status: AC
  Administered 2018-01-06: 11.4 via INTRAVENOUS

## 2018-01-26 ENCOUNTER — Ambulatory Visit: Payer: BLUE CROSS/BLUE SHIELD | Admitting: Pulmonary Disease

## 2018-01-30 ENCOUNTER — Ambulatory Visit (INDEPENDENT_AMBULATORY_CARE_PROVIDER_SITE_OTHER): Payer: BLUE CROSS/BLUE SHIELD | Admitting: Pulmonary Disease

## 2018-01-30 VITALS — BP 126/82 | HR 64 | Ht 65.0 in | Wt 228.0 lb

## 2018-01-30 DIAGNOSIS — J9611 Chronic respiratory failure with hypoxia: Secondary | ICD-10-CM

## 2018-01-30 DIAGNOSIS — J432 Centrilobular emphysema: Secondary | ICD-10-CM

## 2018-01-30 DIAGNOSIS — J449 Chronic obstructive pulmonary disease, unspecified: Secondary | ICD-10-CM | POA: Diagnosis not present

## 2018-01-30 DIAGNOSIS — G4733 Obstructive sleep apnea (adult) (pediatric): Secondary | ICD-10-CM | POA: Diagnosis not present

## 2018-01-30 DIAGNOSIS — R918 Other nonspecific abnormal finding of lung field: Secondary | ICD-10-CM

## 2018-01-30 DIAGNOSIS — R911 Solitary pulmonary nodule: Secondary | ICD-10-CM

## 2018-01-30 NOTE — Progress Notes (Signed)
Order for Chest CT cancelled per BI. Nothing further is needed at this time.

## 2018-01-30 NOTE — Progress Notes (Signed)
Synopsis: Referred in October 2019 for nodule evaluation by Dr. Ricarda Frame, NP  Subjective:   PATIENT ID: Alexandria [name] GENDER: female DOB: 01-03-1936, MRN: [id number]  Chief Complaint  Patient presents with  . Follow-up    Here to discuss PET results. Patient also states she has been coughing alot. She feels like she constantly has a web of mucous in her throat. States she is having hard scabby build up in her nose as well.     Past medical history of chronic diastolic heart failure, COPD Gold 4, FEV1 approximately 1 L in 2016, chronic hypoxemic respiratory failure on 2 L nasal cannula.  Patient is routinely followed by Dr. Halford Chessman in the pulmonary and sleep clinic.  Patient has a diagnosis of OSA and was placed on CPAP in the past.  She has not been using her CPAP for some time as recently restarted on this after being seen in clinic.  At the time review of imaging revealed a small lung nodule from imaging that was completed in April 2018.  Follow-up CT scan completed October 2019 revealed an enlarging right upper lobe lung nodule.  In 2018 the lung nodule was approximately 5 mm in size and now is 10 mm in size.  She does have associated centrilobular emphysema and there is some mild spiculation to the upper lobe lesion.  Patient denies any current symptoms besides exertional dyspnea and shortness of breath with any significant exercise or activity level.  Of note, the patient grew up in [place] [facility].  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  They eventually transitioned to a new business here in the greater [place] area.  They have lived here for many years.  He is currently a construction/developer in the area.  He is still working at the age of 89.  Overall they are doing well and have been married for almost 36 years.  OV 01/30/2018: Patient is here after having her nuclear medicine PET imaging completed.  There was no uptake within the right upper lobe 2 mm nodule that was  found on her prior CT imaging.  Therefore with the absence of uptake in the small sub-solid/semisolid nodule I suspect we can continue to follow this.  Patient had several questions today.  He was present with her daughter.  Overall she has been doing okay since she was last seen continuing her current inhaler regimen she has no respiratory complaints.  She does complain of sinus congestion in which she has been using Dymista as well as fluticasone nasal spray throughout the day.  She is also been resorting to using her finger to did get out dry crusted secretions within her nose.  She also has not always been using a humidifier on her O2.    Past Medical History:  Diagnosis Date  . Allergy, unspecified not elsewhere classified   . Chronic diastolic congestive heart failure ([place])   . COPD (chronic obstructive pulmonary disease) ([place]) 03/08/1998   PFTs 12/12/2002 FEV 1 1.42 (64%) ratio 58 with no better after B2 and DLCO75%; PFTs 11/20/09 FEV1 1.50 (73%) ratio 50 no better after B2 with DLCO 62%; Hfa 50% 11/20/2009 >75%, 01/13/10 p coaching  . Depression 12/07/1998  . Diabetes mellitus type II 02/05/2006   Dr. Cruzita Lederer with endo  . Disorders of bursae and tendons in shoulder region, unspecified    Rotator cuff syndrome, right  . E. coli bacteremia   . Esophagitis   . GERD (gastroesophageal reflux disease)   .  History of UTI   . Hyperlipidemia 10/04/2000  . Hypertension 03/08/1992  . Hypothyroidism 03/08/1968  . Iron deficiency anemia   . Obesity    NOS  . OSA (obstructive sleep apnea)    PSG 01/27/10 AHI 13, pt does not know CPAP settings  . Peripheral neuropathy    Likely due to DM per Dr. Erling Cruz  . Ventral hernia      Family History  Problem Relation Age of Onset  . Heart disease Mother   . Thyroid disease Mother   . Emphysema Father        One lung  . Cystic fibrosis Sister   . Hyperthyroidism Sister   . Osteoarthritis Brother   . Hyperthyroidism Brother   . Esophageal cancer Neg  Hx   . Cancer Neg Hx        Head or neck  . Colon cancer Neg Hx   . Stomach cancer Neg Hx   . Breast cancer Neg Hx      Past Surgical History:  Procedure Laterality Date  . ABD U/S  03/19/1999   Nml x2 foci in liver  . ADENOSINE MYOVIEW  06/02/2007   Nml  . CARDIOLITE PERSANTINE  08/24/2000   Nml  . CAROTID U/S  08/24/2000   1-39% ICA stenosis  . CAROTID U/S  06/02/2007   No apprec change   . CARPAL TUNNEL RELEASE  12/1997   Right  . CESAREAN SECTION     x2 Breech/ repeat  . CHOLECYSTECTOMY  1997  . COLONOSCOPY WITH PROPOFOL N/A 02/12/2016   Procedure: COLONOSCOPY WITH PROPOFOL;  Surgeon: Milus Banister, MD;  Location: [facility];  Service: Endoscopy;  Laterality: N/A;  . CT ABD W & PELVIS WO/W CM     Abd hemangiomas of liver, 1 cm R renal cyst  . DENTAL SURGERY  2016   Implants  . DEXA  07/03/2003   Nml  . ESOPHAGOGASTRODUODENOSCOPY  12/05/1997   Nml (due to hoarseness)  . ESOPHAGOGASTRODUODENOSCOPY (EGD) WITH PROPOFOL N/A 02/12/2016   Procedure: ESOPHAGOGASTRODUODENOSCOPY (EGD) WITH PROPOFOL;  Surgeon: Milus Banister, MD;  Location: [facility];  Service: Endoscopy;  Laterality: N/A;  . GALLBLADDER SURGERY    . HERNIA REPAIR  01/24/2009   Lap Ventr w/ Lysis of adhesions (Dr. Donne Hazel)  . knee arthroscopic surgery  years ago   right  . ROTATOR CUFF REPAIR  1984   Right, [place]  . SHOULDER OPEN ROTATOR CUFF REPAIR  02/08/2012   Procedure: ROTATOR CUFF REPAIR SHOULDER OPEN;  Surgeon: Magnus Sinning, MD;  Location: [facility];  Service: Orthopedics;  Laterality: Left;  Left Shoulder Open Anterior Acrominectomy Rotator Cuff Repair Open Distal Clavicle Resection ,tissue mend graft, and repair of biceps tendon  . THUMB RELEASE  12/1997   Right  . TONSILLECTOMY    . TOTAL ABDOMINAL HYSTERECTOMY  1985   Due to dysmennorhea  . US ECHOCARDIOGRAPHY  06/02/2007    Social History   Socioeconomic History  . Marital status: Married    Spouse name: Not on file  .  Number of children: 2  . Years of education: Not on file  . Highest education level: Not on file  Occupational History  . Occupation: Retired    Fish farm manager: RETIRED  Social Needs  . Financial resource strain: Not on file  . Food insecurity:    Worry: Not on file    Inability: Not on file  . Transportation needs:    Medical: Not on file  Non-medical: Not on file  Tobacco Use  . Smoking status: Former Smoker    Packs/day: 2.00    Years: 50.00    Pack years: 100.00    Types: Cigarettes    Last attempt to quit: 02/05/2005    Years since quitting: 12.9  . Smokeless tobacco: Never Used  Substance and Sexual Activity  . Alcohol use: No    Alcohol/week: 2.0 standard drinks    Types: 2 Standard drinks or equivalent per week    Frequency: Never  . Drug use: Yes    Comment: beer occassionally  . Sexual activity: Not Currently  Lifestyle  . Physical activity:    Days per week: Not on file    Minutes per session: Not on file  . Stress: Not on file  Relationships  . Social connections:    Talks on phone: Not on file    Gets together: Not on file    Attends religious service: Not on file    Active member of club or organization: Not on file    Attends meetings of clubs or organizations: Not on file    Relationship status: Not on file  . Intimate partner violence:    Fear of current or ex partner: Not on file    Emotionally abused: Not on file    Physically abused: Not on file    Forced sexual activity: Not on file  Other Topics Concern  . Not on file  Social History Narrative   Married with 2 children   Enjoys painting     Allergies  Allergen Reactions  . Antihistamines, Diphenhydramine-Type Other (See Comments)    Able to tolerate only allegra.   . Sulfonamide Derivatives Swelling    REACTION: closed throat  . Incruse Ellipta [Umeclidinium Bromide] Cough    Caused voice change and severe coughing  . Clarithromycin Other (See Comments)    REACTION: diff swallowing and  mouth blisters  . Codeine Other (See Comments)    Unknown   . Lyrica [Pregabalin] Other (See Comments)    Lack of effect for neuropathy pain.    Marland Kitchen Spiriva Handihaler [Tiotropium Bromide Monohydrate] Other (See Comments)    Voice changes     Outpatient Medications Prior to Visit  Medication Sig Dispense Refill  . albuterol (PROAIR HFA) 108 (90 Base) MCG/ACT inhaler INHALE 2 PUFFS INTO THE LUNGS EVERY 6 HOURS AS NEEDED FOR WHEEZING OR SHORTNESS OF BREATH 8.5 Inhaler 2  . Coenzyme Q-10 200 MG CAPS Take 200 mg by mouth daily.     Marland Kitchen EPINEPHrine (EPIPEN) 0.3 mg/0.3 mL DEVI Inject 0.3 mg into the muscle once as needed (anaphylaxis).     Marland Kitchen esomeprazole (NEXIUM) 40 MG capsule Take 40 mg by mouth daily at 12 noon.    . ezetimibe (ZETIA) 10 MG tablet Take 1 tablet (10 mg total) by mouth at bedtime.    . ferrous sulfate 325 (65 FE) MG EC tablet Take 1 tablet (325 mg total) by mouth daily with breakfast.    . fexofenadine (ALLEGRA) 180 MG tablet Take 180 mg by mouth as needed.     Marland Kitchen FLUoxetine (PROZAC) 10 MG capsule TAKE 3 CAPSULES IN THE MORNING AND TAKE 3 CAPSULES IN THE EVENING    . fluticasone (FLONASE) 50 MCG/ACT nasal spray USE 2 SPRAYS INTO THE NOSE EVERY 12 HOURS AS NEEDED FOR STUFFY NOSE 48 g 2  . furosemide (LASIX) 20 MG tablet Take 1 tablet (20 mg total) by mouth daily as needed for fluid.  30 tablet 1  . glucose blood (ONETOUCH VERIO) test strip Use as instructed to check sugar 2-3x time daily 200 each 5  . ibuprofen (ADVIL,MOTRIN) 200 MG tablet Take 200-400 mg by mouth every 6 (six) hours as needed for moderate pain.     Marland Kitchen LORazepam (ATIVAN) 0.5 MG tablet Take 1 tablet (0.5 mg total) by mouth 2 (two) times daily as needed for anxiety. 180 tablet 0  . metFORMIN (GLUCOPHAGE) 500 MG tablet Take 1 tablet (500 mg total) by mouth daily with supper. 90 tablet 3  . nitroGLYCERIN (NITROSTAT) 0.4 MG SL tablet Place 1 tablet (0.4 mg total) under the tongue every 5 (five) minutes as needed for chest  pain. 25 tablet 3  . ONETOUCH DELICA LANCETS FINE MISC USE TO CHECK SUGAR 1 TIME DAILY 100 each 5  . potassium chloride (K-DUR) 10 MEQ tablet Take 1 tablet (10 mEq total) by mouth daily as needed (Takes with furosemide doses.). 30 tablet 1  . PROAIR HFA 108 (90 Base) MCG/ACT inhaler INHALE 2 PUFFS INTO THE LUNGS EVERY 6 HOURS AS NEEDED FOR WHEEZING OR SHORTNESS OF BREATH 8.5 Inhaler 2  . rosuvastatin (CRESTOR) 10 MG tablet TAKE 1 TABLET (10 MG TOTAL) BY MOUTH EVERY OTHER DAY. 45 tablet 3  . Spacer/Aero Chamber [employer] Use with  inhaler as needed.  J44.9 1 each 0  . SYMBICORT 160-4.5 MCG/ACT inhaler INHALE 2 PUFFS FIRST THING IN THE MORNING AND 2 PUFFS AGAIN IN THE EVENING ABOUT 12 HOURS LATER 30.6 Inhaler 2  . SYNTHROID 125 MCG tablet TAKE 1 TABLET (125 MCG TOTAL) BY MOUTH DAILY BEFORE BREAKFAST. 90 tablet 1  . valsartan (DIOVAN) 160 MG tablet Take 1 tablet (160 mg total) by mouth 2 (two) times daily. 180 tablet 3  . vitamin B-12 (CYANOCOBALAMIN) 50 MCG tablet Take 50 mcg by mouth daily.     . Azelastine-Fluticasone 137-50 MCG/ACT SUSP Place 2 puffs into the nose 1 day or 1 dose for 1 dose. 1 Bottle 3  . cephALEXin (KEFLEX) 500 MG capsule Take 1 capsule (500 mg total) by mouth 2 (two) times daily. (Patient not taking: Reported on 01/30/2018) 14 capsule 0   No facility-administered medications prior to visit.     Review of Systems  Constitutional: Positive for malaise/fatigue. Negative for chills, fever and weight loss.  HENT: Negative for hearing loss, sore throat and tinnitus.   Eyes: Negative for blurred vision and double vision.  Respiratory: Positive for shortness of breath and wheezing. Negative for cough, hemoptysis, sputum production and stridor.   Cardiovascular: Negative for chest pain, palpitations, orthopnea, leg swelling and PND.  Gastrointestinal: Negative for abdominal pain, constipation, diarrhea, heartburn, nausea and vomiting.  Genitourinary: Negative for dysuria,  hematuria and urgency.  Musculoskeletal: Negative for joint pain and myalgias.  Skin: Negative for itching and rash.  Neurological: Negative for dizziness, tingling, weakness and headaches.  Endo/Heme/Allergies: Negative for environmental allergies. Does not bruise/bleed easily.  Psychiatric/Behavioral: Negative for depression. The patient is nervous/anxious. The patient does not have insomnia.   All other systems reviewed and are negative.    Objective:  Physical Exam  Constitutional: She is oriented to person, place, and time. She appears well-developed and well-nourished. No distress.  Obese  HENT:  Head: Normocephalic and atraumatic.  Mouth/Throat: Oropharynx is clear and moist.  Eyes: Pupils are equal, round, and reactive to light. Conjunctivae are normal. No scleral icterus.  Neck: Neck supple. No JVD present. No tracheal deviation present.  Cardiovascular: Normal rate, regular  rhythm, normal heart sounds and intact distal pulses.  No murmur heard. Pulmonary/Chest: Effort normal. No accessory muscle usage or stridor. No tachypnea. No respiratory distress. She has wheezes. She has no rhonchi. She has no rales.  Abdominal: Soft. Bowel sounds are normal. She exhibits no distension. There is no tenderness.  Musculoskeletal: She exhibits no edema or tenderness.  Lymphadenopathy:    She has no cervical adenopathy.  Neurological: She is alert and oriented to person, place, and time.  Skin: Skin is warm and dry. Capillary refill takes less than 2 seconds. No rash noted.  Psychiatric: She has a normal mood and affect. Her behavior is normal.  Vitals reviewed.   Vitals:   01/30/18 1056  BP: 126/82  Pulse: 64  SpO2: 96%  Weight: 228 lb (103.4 kg)  Height: 5\' 5"  (1.651 m)   96% on 2LPM BMI Readings from Last 3 Encounters:  01/30/18 37.94 kg/m  12/30/17 38.81 kg/m  12/28/17 38.94 kg/m   Wt Readings from Last 3 Encounters:  01/30/18 228 lb (103.4 kg)  12/30/17 233 lb 4 oz  (105.8 kg)  12/28/17 234 lb (106.1 kg)     CBC    Component Value Date/Time   WBC 7.2 08/09/2017 1143   RBC 3.81 (L) 08/09/2017 1143   HGB 11.6 (L) 08/09/2017 1143   HCT 35.4 (L) 08/09/2017 1143   PLT 212.0 08/09/2017 1143   MCV 93.0 08/09/2017 1143   MCH 23.1 (L) 10/01/2014 0326   MCHC 32.9 08/09/2017 1143   RDW 15.2 08/09/2017 1143   LYMPHSABS 1.3 08/09/2017 1143   MONOABS 0.6 08/09/2017 1143   EOSABS 0.1 08/09/2017 1143   BASOSABS 0.0 08/09/2017 1143    Chest Imaging:  01/06/2018 PET imaging: No PET take within the ill-defined groundglass nodular opacities of the right upper lobe.  Stable subpleural nodular density within the right upper lobe. The patient's images have been independently reviewed by me.    Pulmonary Functions Testing Results: PFT Results Latest Ref Rng & Units 08/15/2014  FVC-Pre L 1.92  FVC-Predicted Pre % 70  FVC-Post L 1.95  FVC-Predicted Post % 71  Pre FEV1/FVC % % 53  Post FEV1/FCV % % 55  FEV1-Pre L 1.01  FEV1-Predicted Pre % 49  FEV1-Post L 1.07  DLCO UNC% % 47  DLCO COR %Predicted % 70  TLC L 6.49  TLC % Predicted % 128  RV % Predicted % 188     FeNO: None  Pathology: None  Echocardiogram: 2016-preserved ejection fraction 55 to 60%.  Heart Catheterization: None    Assessment & Plan:   Solitary pulmonary nodule  Abnormal findings on diagnostic imaging of lung  Chronic respiratory failure with hypoxia (HCC)  Centrilobular emphysema (HCC)  OSA (obstructive sleep apnea)  COPD GOLD II, FEV1 52% post-BD   Discussion:  This is an 82 year old female with a history of COPD, chronic respiratory failure, upper lobe emphysema as well as a right upper lobe pulmonary nodule.  From 2018 until 2019 the nodule had slightly enlarged from 5 mm to 10 mm.  PET imaging in November 2019 revealed no PET uptake.  At this point I think she can stay on her current inhaler regimen.  I recommended her to stop using the current nasal spray regimen  that she is using as I think it has dried all of her secretions out.  Additionally to use a saline nasal spray as needed.   We will recommend a 19-month follow-up CT to be completed.  We will  go ahead and obtain a super D image in 53-month interval in case we need to plan for a navigational bronchoscopy.  We also discussed the risks versus benefits of navigational bronchoscopy versus percutaneous needle biopsy if the nodule was to continue to enlarge.  This was discussed between the patient as well as the patient's daughter.  Greater than 50% of this patient's 40-minute office visit was spent face-to-face discussing the above recommendations and treatment/diagnostic approach.    Current Outpatient Medications:  .  albuterol (PROAIR HFA) 108 (90 Base) MCG/ACT inhaler, INHALE 2 PUFFS INTO THE LUNGS EVERY 6 HOURS AS NEEDED FOR WHEEZING OR SHORTNESS OF BREATH, Disp: 8.5 Inhaler, Rfl: 2 .  Coenzyme Q-10 200 MG CAPS, Take 200 mg by mouth daily. , Disp: , Rfl:  .  EPINEPHrine (EPIPEN) 0.3 mg/0.3 mL DEVI, Inject 0.3 mg into the muscle once as needed (anaphylaxis). , Disp: , Rfl:  .  esomeprazole (NEXIUM) 40 MG capsule, Take 40 mg by mouth daily at 12 noon., Disp: , Rfl:  .  ezetimibe (ZETIA) 10 MG tablet, Take 1 tablet (10 mg total) by mouth at bedtime., Disp: , Rfl:  .  ferrous sulfate 325 (65 FE) MG EC tablet, Take 1 tablet (325 mg total) by mouth daily with breakfast., Disp: , Rfl:  .  fexofenadine (ALLEGRA) 180 MG tablet, Take 180 mg by mouth as needed. , Disp: , Rfl:  .  FLUoxetine (PROZAC) 10 MG capsule, TAKE 3 CAPSULES IN THE MORNING AND TAKE 3 CAPSULES IN THE EVENING, Disp: , Rfl:  .  fluticasone (FLONASE) 50 MCG/ACT nasal spray, USE 2 SPRAYS INTO THE NOSE EVERY 12 HOURS AS NEEDED FOR STUFFY NOSE, Disp: 48 g, Rfl: 2 .  furosemide (LASIX) 20 MG tablet, Take 1 tablet (20 mg total) by mouth daily as needed for fluid., Disp: 30 tablet, Rfl: 1 .  glucose blood (ONETOUCH VERIO) test strip, Use as  instructed to check sugar 2-3x time daily, Disp: 200 each, Rfl: 5 .  ibuprofen (ADVIL,MOTRIN) 200 MG tablet, Take 200-400 mg by mouth every 6 (six) hours as needed for moderate pain. , Disp: , Rfl:  .  LORazepam (ATIVAN) 0.5 MG tablet, Take 1 tablet (0.5 mg total) by mouth 2 (two) times daily as needed for anxiety., Disp: 180 tablet, Rfl: 0 .  metFORMIN (GLUCOPHAGE) 500 MG tablet, Take 1 tablet (500 mg total) by mouth daily with supper., Disp: 90 tablet, Rfl: 3 .  nitroGLYCERIN (NITROSTAT) 0.4 MG SL tablet, Place 1 tablet (0.4 mg total) under the tongue every 5 (five) minutes as needed for chest pain., Disp: 25 tablet, Rfl: 3 .  ONETOUCH DELICA LANCETS FINE MISC, USE TO CHECK SUGAR 1 TIME DAILY, Disp: 100 each, Rfl: 5 .  potassium chloride (K-DUR) 10 MEQ tablet, Take 1 tablet (10 mEq total) by mouth daily as needed (Takes with furosemide doses.)., Disp: 30 tablet, Rfl: 1 .  PROAIR HFA 108 (90 Base) MCG/ACT inhaler, INHALE 2 PUFFS INTO THE LUNGS EVERY 6 HOURS AS NEEDED FOR WHEEZING OR SHORTNESS OF BREATH, Disp: 8.5 Inhaler, Rfl: 2 .  rosuvastatin (CRESTOR) 10 MG tablet, TAKE 1 TABLET (10 MG TOTAL) BY MOUTH EVERY OTHER DAY., Disp: 45 tablet, Rfl: 3 .  Spacer/Aero Chamber Mouthpiece MISC, Use with  inhaler as needed.  J44.9, Disp: 1 each, Rfl: 0 .  SYMBICORT 160-4.5 MCG/ACT inhaler, INHALE 2 PUFFS FIRST THING IN THE MORNING AND 2 PUFFS AGAIN IN THE EVENING ABOUT 12 HOURS LATER, Disp: 30.6 Inhaler, Rfl: 2 .  SYNTHROID 125 MCG tablet, TAKE 1 TABLET (125 MCG TOTAL) BY MOUTH DAILY BEFORE BREAKFAST., Disp: 90 tablet, Rfl: 1 .  valsartan (DIOVAN) 160 MG tablet, Take 1 tablet (160 mg total) by mouth 2 (two) times daily., Disp: 180 tablet, Rfl: 3 .  vitamin B-12 (CYANOCOBALAMIN) 50 MCG tablet, Take 50 mcg by mouth daily. , Disp: , Rfl:  .  Azelastine-Fluticasone 137-50 MCG/ACT SUSP, Place 2 puffs into the nose 1 day or 1 dose for 1 dose., Disp: 1 Bottle, Rfl: 3   Garner Nash, DO [facility] 01/30/2018 11:29 AM

## 2018-01-30 NOTE — Patient Instructions (Addendum)
Thank you for visiting Dr. Valeta Harms at [facility]. Today we recommend the following:  RTC in 3 months with Dr. Halford Chessman.

## 2018-01-30 NOTE — Addendum Note (Signed)
Addended by: Vivia Ewing on: 01/30/2018 11:32 AM   Modules accepted: Orders

## 2018-02-07 ENCOUNTER — Other Ambulatory Visit: Payer: Self-pay | Admitting: Internal Medicine

## 2018-02-08 DIAGNOSIS — F4322 Adjustment disorder with anxiety: Secondary | ICD-10-CM | POA: Diagnosis not present

## 2018-02-15 DIAGNOSIS — M8589 Other specified disorders of bone density and structure, multiple sites: Secondary | ICD-10-CM | POA: Diagnosis not present

## 2018-02-15 DIAGNOSIS — Z1231 Encounter for screening mammogram for malignant neoplasm of breast: Secondary | ICD-10-CM | POA: Diagnosis not present

## 2018-02-15 DIAGNOSIS — Z8709 Personal history of other diseases of the respiratory system: Secondary | ICD-10-CM | POA: Diagnosis not present

## 2018-02-15 LAB — HM DEXA SCAN

## 2018-02-15 LAB — HM MAMMOGRAPHY

## 2018-02-15 LAB — HM DIABETES EYE EXAM

## 2018-02-20 ENCOUNTER — Other Ambulatory Visit: Payer: Self-pay | Admitting: Family Medicine

## 2018-02-20 DIAGNOSIS — M8580 Other specified disorders of bone density and structure, unspecified site: Secondary | ICD-10-CM

## 2018-02-22 ENCOUNTER — Other Ambulatory Visit (INDEPENDENT_AMBULATORY_CARE_PROVIDER_SITE_OTHER): Payer: BLUE CROSS/BLUE SHIELD

## 2018-02-22 DIAGNOSIS — M8580 Other specified disorders of bone density and structure, unspecified site: Secondary | ICD-10-CM

## 2018-02-23 LAB — VITAMIN D 25 HYDROXY (VIT D DEFICIENCY, FRACTURES): VITD: 23.63 ng/mL — ABNORMAL LOW (ref 30.00–100.00)

## 2018-02-24 ENCOUNTER — Encounter: Payer: Self-pay | Admitting: Family Medicine

## 2018-02-26 ENCOUNTER — Other Ambulatory Visit: Payer: Self-pay | Admitting: Family Medicine

## 2018-02-26 DIAGNOSIS — M8580 Other specified disorders of bone density and structure, unspecified site: Secondary | ICD-10-CM

## 2018-02-26 DIAGNOSIS — E559 Vitamin D deficiency, unspecified: Secondary | ICD-10-CM

## 2018-02-26 MED ORDER — VITAMIN D (ERGOCALCIFEROL) 1.25 MG (50000 UNIT) PO CAPS: 50000.0000 [IU] | ORAL_CAPSULE | ORAL | 0 refills | Status: DC

## 2018-02-27 ENCOUNTER — Encounter: Payer: Self-pay | Admitting: Family Medicine

## 2018-03-14 DIAGNOSIS — F4322 Adjustment disorder with anxiety: Secondary | ICD-10-CM | POA: Diagnosis not present

## 2018-03-16 NOTE — Telephone Encounter (Signed)
This patient's MyChart messages went to the wrong pool where [facility] did not receive the messages.  Patient has been seen since this message and addressed. Nothing further needed at this time.  

## 2018-03-17 ENCOUNTER — Other Ambulatory Visit: Payer: Self-pay | Admitting: Family Medicine

## 2018-03-17 DIAGNOSIS — R062 Wheezing: Secondary | ICD-10-CM

## 2018-03-22 DIAGNOSIS — Z85828 Personal history of other malignant neoplasm of skin: Secondary | ICD-10-CM | POA: Diagnosis not present

## 2018-03-22 DIAGNOSIS — Z23 Encounter for immunization: Secondary | ICD-10-CM | POA: Diagnosis not present

## 2018-03-22 DIAGNOSIS — L281 Prurigo nodularis: Secondary | ICD-10-CM | POA: Diagnosis not present

## 2018-03-22 DIAGNOSIS — I872 Venous insufficiency (chronic) (peripheral): Secondary | ICD-10-CM | POA: Diagnosis not present

## 2018-03-22 DIAGNOSIS — D225 Melanocytic nevi of trunk: Secondary | ICD-10-CM | POA: Diagnosis not present

## 2018-04-19 ENCOUNTER — Encounter: Payer: Self-pay | Admitting: Pulmonary Disease

## 2018-04-19 ENCOUNTER — Ambulatory Visit: Payer: BLUE CROSS/BLUE SHIELD | Admitting: Pulmonary Disease

## 2018-04-19 ENCOUNTER — Ambulatory Visit (INDEPENDENT_AMBULATORY_CARE_PROVIDER_SITE_OTHER): Payer: Medicare Other | Admitting: Pulmonary Disease

## 2018-04-19 VITALS — BP 128/68 | HR 77 | Ht 65.0 in | Wt 220.0 lb

## 2018-04-19 DIAGNOSIS — J432 Centrilobular emphysema: Secondary | ICD-10-CM | POA: Diagnosis not present

## 2018-04-19 DIAGNOSIS — R062 Wheezing: Secondary | ICD-10-CM

## 2018-04-19 DIAGNOSIS — R911 Solitary pulmonary nodule: Secondary | ICD-10-CM

## 2018-04-19 DIAGNOSIS — J449 Chronic obstructive pulmonary disease, unspecified: Secondary | ICD-10-CM

## 2018-04-19 DIAGNOSIS — J9611 Chronic respiratory failure with hypoxia: Secondary | ICD-10-CM

## 2018-04-19 MED ORDER — BUDESONIDE-FORMOTEROL FUMARATE 160-4.5 MCG/ACT IN AERO: 2.0000 | INHALATION_SPRAY | Freq: Two times a day (BID) | RESPIRATORY_TRACT | 3 refills | Status: DC

## 2018-04-19 MED ORDER — ALBUTEROL SULFATE HFA 108 (90 BASE) MCG/ACT IN AERS: 2.0000 | INHALATION_SPRAY | Freq: Four times a day (QID) | RESPIRATORY_TRACT | 3 refills | Status: DC | PRN

## 2018-04-19 NOTE — Patient Instructions (Signed)
Will schedule overnight oxygen test   Will schedule CT chest without contrast for May 2020 and then follow up after this is done

## 2018-04-19 NOTE — Progress Notes (Signed)
[facility]  Chief Complaint  Patient presents with  . Follow-up    Pt has dry cough, increase postnasal drip, and SOB with exertion.    Constitutional:  BP 128/68 (BP Location: Left Arm, Cuff Size: Normal)   Pulse 77   Ht 5\' 5"  (1.651 m)   Wt 220 lb (99.8 kg)   SpO2 96%   BMI 36.61 kg/m   Past Medical History:  Depression, Hypothyroidism, HTN, Diastolic CHF, HLD, DM, GERD, Peripheral neuropathy  Brief Summary:  Alexandria [name] is a 83 y.o. female with dyspnea from COPD, diastolic CHF, deconditioning, and iron deficiency anemia.  She also has hx of OSA.  She had CT chest in October.  Showed increased size of RUL nodule.  Had PET scan in November.  No increase in uptake.  Images reviewed by me.  She has cough that is usually non productive.  Not having wheeze, chest pain, fever, or hemoptysis.  Gets winded quickly.  Not very active.  Has been using oxygen.  She is having more trouble with her sleep.  She isn't sure if she needs to use oxygen at night.  Physical Exam:   Appearance - well kempt   ENMT - clear nasal mucosa, midline nasal  septum, no oral exudates, no LAN, trachea midline  Respiratory - normal chest wall, normal respiratory effort, no accessory muscle use, no wheeze/rales  CV - s1s2 regular rate and rhythm, no murmurs, no peripheral edema, radial pulses symmetric  GI - soft, non tender, no masses  Lymph - no adenopathy noted in neck and axillary areas  MSK - normal gait  Ext - no cyanosis, clubbing, or joint inflammation noted  Skin - no rashes, lesions, or ulcers  Neuro - normal strength, oriented x 3  Psych - normal mood and affect  Assessment/Plan:   COPD with emphysema. - continue symbicort and prn proair  Chronic respiratory failure with hypoxia. - 2 liters oxygen with exertion - will arrange for overnight oximetry with 2 liters and then determine if she needs to have adjustment to her home oxygen  set up or if she needs further assessment for sleep disordered breathing  Lung nodule. - She will need f/u CT chest without contrast in May 2020   Patient Instructions  Will schedule overnight oxygen test   Will schedule CT chest without contrast for May 2020 and then follow up after this is done  A total of  28 minutes were spent face to face with the patient and more than half of that time involved counseling or coordination of care.   Chesley Mires, MD [facility] Pager: [phone] 04/19/2018, 2:28 PM  Flow Sheet     Pulmonary tests:  RAST 10/02/10 >> negative, IgE 8.1 A1AT 04/15/11 >> MM PFT 10/512 >> FEV1 1.29 (61%), FEV1% 57, TLC 6.01 (119%), RV 3.52 (153%), DLCO 67%, no BD PFT 08/15/14 >> FEV1 1.07 (57%), FEV1% 55, TLC 6.49 (128%), DLCO 47%, no BD  Chest Imaging:  CT chest 06/29/16 >> atherosclerosis, RUL nodule 7 mm (was 8 mm), no change 6 mm LUL nodule, no change 4 mm RLL nodule  CT chest 12/23/17 >> 10 mm RUL nodule, 7 mm RUL nodule, 4 mm RLL nodule, 6 mm LUL nodule, centrilobular emphysema PET scan 01/06/18 >> RUL nodule not hypermetabolic  Sleep tests:  PSG 01/27/10 >> AHI 13, SpO2 low 73% ONO with CPAP 08/09/14 >> Test time 9 hr 16 min. Mean SpO2 92.2%, low SpO2  86%. Spent 10 min with SpO2 < 88% ONO with CPAP 06/24/16 >>test time 7 hrs 29 min. Average SpO2 90%, low SpO2 79%. Spent 58 min with SpO2 <88%. ONO with RA 11/01/16 >> test time 6 hrs 43 min.  Average SpO2 89%, low SpO2 67%.  Spent 2 hrs 7 min with SpO2 < 88%.  Cardiac tests:  Echo 09/28/14 >> mild LVH, EF 55 to 60%  Medications:   Allergies as of 04/19/2018      Reactions   Antihistamines, Diphenhydramine-type Other (See Comments)   Able to tolerate only allegra.    Sulfonamide Derivatives Swelling   REACTION: closed throat   Incruse Ellipta [umeclidinium Bromide] Cough   Caused voice change and severe coughing   Clarithromycin Other (See Comments)   REACTION: diff swallowing  and mouth blisters   Codeine Other (See Comments)   Unknown    Lyrica [pregabalin] Other (See Comments)   Lack of effect for neuropathy pain.     Spiriva Handihaler [tiotropium Bromide Monohydrate] Other (See Comments)   Voice changes      Medication List       Accurate as of April 19, 2018  2:28 PM. Always use your most recent med list.        Azelastine-Fluticasone 137-50 MCG/ACT Susp Place 2 puffs into the nose 1 day or 1 dose for 1 dose.   Coenzyme Q-10 200 MG Caps Take 200 mg by mouth daily.   EPIPEN 0.3 mg/0.3 mL Devi Generic drug:  EPINEPHrine Inject 0.3 mg into the muscle once as needed (anaphylaxis).   esomeprazole 40 MG capsule Commonly known as:  NEXIUM Take 40 mg by mouth daily at 12 noon.   ezetimibe 10 MG tablet Commonly known as:  ZETIA Take 1 tablet (10 mg total) by mouth at bedtime.   ferrous sulfate 325 (65 FE) MG EC tablet Take 1 tablet (325 mg total) by mouth daily with breakfast.   fexofenadine 180 MG tablet Commonly known as:  ALLEGRA Take 180 mg by mouth as needed.   FLUoxetine 10 MG capsule Commonly known as:  PROZAC TAKE 3 CAPSULES IN THE MORNING AND TAKE 3 CAPSULES IN THE EVENING   fluticasone 50 MCG/ACT nasal spray Commonly known as:  FLONASE USE 2 SPRAYS INTO THE NOSE EVERY 12 HOURS AS NEEDED FOR STUFFY NOSE   furosemide 20 MG tablet Commonly known as:  LASIX Take 1 tablet (20 mg total) by mouth daily as needed for fluid.   glucose blood test strip Commonly known as:  ONETOUCH VERIO Use as instructed to check sugar 2-3x time daily   ONETOUCH VERIO test strip Generic drug:  glucose blood USE AS INSTRUCTED TO CHECK SUGAR 1 TIME DAILY   ibuprofen 200 MG tablet Commonly known as:  ADVIL,MOTRIN Take 200-400 mg by mouth every 6 (six) hours as needed for moderate pain.   LORazepam 0.5 MG tablet Commonly known as:  ATIVAN Take 1 tablet (0.5 mg total) by mouth 2 (two) times daily as needed for anxiety.   metFORMIN 500 MG  tablet Commonly known as:  GLUCOPHAGE Take 1 tablet (500 mg total) by mouth daily with supper.   nitroGLYCERIN 0.4 MG SL tablet Commonly known as:  NITROSTAT Place 1 tablet (0.4 mg total) under the tongue every 5 (five) minutes as needed for chest pain.   ONETOUCH DELICA LANCETS FINE Misc USE TO CHECK SUGAR 1 TIME DAILY   potassium chloride 10 MEQ tablet Commonly known as:  K-DUR Take 1 tablet (10 mEq  total) by mouth daily as needed (Takes with furosemide doses.).   PROAIR HFA 108 (90 Base) MCG/ACT inhaler Generic drug:  albuterol INHALE 2 PUFFS INTO THE LUNGS EVERY 6 HOURS AS NEEDED FOR WHEEZING OR SHORTNESS OF BREATH   albuterol 108 (90 Base) MCG/ACT inhaler Commonly known as:  PROAIR HFA INHALE 2 PUFFS INTO THE LUNGS EVERY 6 HOURS AS NEEDED FOR WHEEZING OR SHORTNESS OF BREATH   rosuvastatin 10 MG tablet Commonly known as:  CRESTOR TAKE 1 TABLET (10 MG TOTAL) BY MOUTH EVERY OTHER DAY.   Spacer/Aero Chamber [employer] Use with  inhaler as needed.  J44.9   SYMBICORT 160-4.5 MCG/ACT inhaler Generic drug:  budesonide-formoterol INHALE 2 PUFFS FIRST THING IN THE MORNING AND 2 PUFFS AGAIN IN THE EVENING ABOUT 12 HOURS LATER   SYNTHROID 125 MCG tablet Generic drug:  levothyroxine TAKE 1 TABLET (125 MCG TOTAL) BY MOUTH DAILY BEFORE BREAKFAST.   valsartan 160 MG tablet Commonly known as:  DIOVAN Take 1 tablet (160 mg total) by mouth 2 (two) times daily.   vitamin B-12 50 MCG tablet Commonly known as:  CYANOCOBALAMIN Take 50 mcg by mouth daily.   Vitamin D (Ergocalciferol) 1.25 MG (50000 UT) Caps capsule Commonly known as:  DRISDOL Take 1 capsule (50,000 Units total) by mouth every 7 (seven) days.       Past Surgical History:  She  has a past surgical history that includes Tonsillectomy; Rotator cuff repair (1984); Cesarean section; Total abdominal hysterectomy (1985); Cholecystectomy (1997); THUMB RELEASE (12/1997); Carpal tunnel release (12/1997);  Esophagogastroduodenoscopy (12/05/1997); ABD U/S (03/19/1999); CT ABD W & PELVIS WO/W CM; CARDIOLITE PERSANTINE (08/24/2000); CAROTID U/S (08/24/2000); DEXA (07/03/2003); ADENOSINE MYOVIEW (06/02/2007); CAROTID U/S (06/02/2007); US ECHOCARDIOGRAPHY (06/02/2007); Hernia repair (01/24/2009); knee arthroscopic surgery (years ago); Shoulder open rotator cuff repair (02/08/2012); Gallbladder surgery; Dental surgery (2016); Colonoscopy with propofol (N/A, 02/12/2016); and Esophagogastroduodenoscopy (egd) with propofol (N/A, 02/12/2016).  Family History:  Her family history includes Cystic fibrosis in her sister; Emphysema in her father; Heart disease in her mother; Hyperthyroidism in her brother and sister; Osteoarthritis in her brother; Thyroid disease in her mother.  Social History:  She  reports that she quit smoking about 13 years ago. Her smoking use included cigarettes. She has a 100.00 pack-year smoking history. She has never used smokeless tobacco. She reports current drug use. She reports that she does not drink alcohol.

## 2018-04-27 ENCOUNTER — Other Ambulatory Visit: Payer: Self-pay | Admitting: Internal Medicine

## 2018-05-01 ENCOUNTER — Other Ambulatory Visit: Payer: Self-pay | Admitting: Internal Medicine

## 2018-05-01 ENCOUNTER — Other Ambulatory Visit: Payer: Self-pay | Admitting: Family Medicine

## 2018-05-02 ENCOUNTER — Other Ambulatory Visit: Payer: Self-pay | Admitting: *Deleted

## 2018-05-02 NOTE — Telephone Encounter (Signed)
Faxed refill request. Zetia Previously filled by another provider Please advise.

## 2018-05-03 MED ORDER — EZETIMIBE 10 MG PO TABS: 10.0000 mg | ORAL_TABLET | Freq: Every day | ORAL | 1 refills | Status: DC

## 2018-05-03 NOTE — Telephone Encounter (Signed)
Please call patient and schedule appointment as instructed. 

## 2018-05-03 NOTE — Telephone Encounter (Signed)
Sent. Thanks.  Due for AMW visit.

## 2018-05-04 ENCOUNTER — Telehealth: Payer: Self-pay | Admitting: Pulmonary Disease

## 2018-05-04 NOTE — Telephone Encounter (Signed)
LMTCB for the pt 

## 2018-05-04 NOTE — Telephone Encounter (Signed)
Spoke with pt. States that she has not had any missed calls from [facility]. She asked for their phone number so she could call them. This has been given to her. Nothing further was needed at this time.

## 2018-05-04 NOTE — Telephone Encounter (Signed)
Pt is calling back [phone]

## 2018-05-04 NOTE — Telephone Encounter (Signed)
Pt is scheduled for AMV with Lesia on 05/24/18 @ 11:30 and Dr. [name] 05/29/18 @ 2pm.

## 2018-05-11 ENCOUNTER — Telehealth: Payer: Self-pay | Admitting: Pulmonary Disease

## 2018-05-11 NOTE — Telephone Encounter (Signed)
Called and spoke with patient. I cannot find any reason why our office would be calling patient. And the patient didn't know just said she was called and a message was left for her 05/10/18 at3:15pm . Nothing further needed. Patient said she would wait for the call for the results of her sleep study.

## 2018-05-16 DIAGNOSIS — F4322 Adjustment disorder with anxiety: Secondary | ICD-10-CM | POA: Diagnosis not present

## 2018-05-17 ENCOUNTER — Telehealth: Payer: Self-pay | Admitting: Pulmonary Disease

## 2018-05-17 NOTE — Telephone Encounter (Signed)
Called and spoke with pt who is requesting results of ONO which was done on 3/4.   Dr. Halford Chessman, please advise if you have the results. Thanks!

## 2018-05-18 ENCOUNTER — Telehealth: Payer: Self-pay | Admitting: Internal Medicine

## 2018-05-18 NOTE — Telephone Encounter (Signed)
Patient stated that she is needing a PA started for her  SYNTHROID 125 MCG tablet   She stated her insurance will no longer cover this and wanted her to let us know to start a PA.   Please advise

## 2018-05-22 ENCOUNTER — Other Ambulatory Visit: Payer: Self-pay | Admitting: General Surgery

## 2018-05-22 ENCOUNTER — Telehealth: Payer: Self-pay

## 2018-05-22 DIAGNOSIS — J449 Chronic obstructive pulmonary disease, unspecified: Secondary | ICD-10-CM

## 2018-05-22 DIAGNOSIS — J9611 Chronic respiratory failure with hypoxia: Secondary | ICD-10-CM

## 2018-05-22 NOTE — Telephone Encounter (Signed)
Pt is calling back [phone]

## 2018-05-22 NOTE — Telephone Encounter (Signed)
[place] calling from [place], she states she is returning call to [place], this patient did not wear the equipment long enough and they do not have enough data to result the ONO so the patient will need to repeat the ONO.    Call back # [phone]

## 2018-05-22 NOTE — Telephone Encounter (Signed)
I haven't received results.

## 2018-05-22 NOTE — Telephone Encounter (Signed)
Working on it now

## 2018-05-22 NOTE — Telephone Encounter (Signed)
Will await response from [facility].

## 2018-05-22 NOTE — Telephone Encounter (Signed)
Called the patient back to let her know of the response from [employer]. She stated that she thought about it after the call and that she did use the machine from midnight until 8 am. Only got up twice during the night to use the bathroom.   Patient stated she slept sitting in a chair (reclinder) with legs elevated and does not toss and turn. Stated when she got up to use bathroom the sensor on her finger did not come off at any time.  Patient said [employer] contacted her and said they will bring the machine to her home on Wednesday 05/24/18.  New order placed.   Message routed to Dr. Halford Chessman to update.

## 2018-05-22 NOTE — Telephone Encounter (Signed)
  Called [facility] (was on hold for 9 minutes before call was picked up. Spoke with Rise Paganini and advised her Dr. Halford Chessman needed results for the ONO done 05/10/18. Call was placed on hold again. [place] in their respiratory department picked up call and stated she could not see the information in what had transferred over, but that does not mean it is not there. They will need to pull the information for it to be resent.   Requested they call our office once it has been found and then fax as this information it was needed for Dr. Halford Chessman to determine if the patient needed CPAP or other treatment. Advised we needed this asap.  Called the patient to let her know [employer] was called about the ONO and that as soon as Dr. Halford Chessman has the information she will be contacted.  The patient stated when she went to the location for the ONO done the people there were acting indifferent as if they did not care, Someone was being trained and the people there were not friendly at all. I told the patient I was sorry she went through that, but that as soon as we get the information we will let her know.

## 2018-05-24 ENCOUNTER — Ambulatory Visit: Payer: Medicare Other

## 2018-05-25 NOTE — Telephone Encounter (Signed)
Noted.  Will close encounter.  

## 2018-05-25 NOTE — Telephone Encounter (Signed)
Noted  

## 2018-05-29 ENCOUNTER — Encounter: Payer: BLUE CROSS/BLUE SHIELD | Admitting: Family Medicine

## 2018-06-14 ENCOUNTER — Ambulatory Visit (INDEPENDENT_AMBULATORY_CARE_PROVIDER_SITE_OTHER): Payer: BLUE CROSS/BLUE SHIELD

## 2018-06-14 DIAGNOSIS — Z0000 Encounter for general adult medical examination without abnormal findings: Secondary | ICD-10-CM | POA: Diagnosis not present

## 2018-06-14 NOTE — Progress Notes (Signed)
Virtual Visit via Video Note  I connected with [name] on 06/14/18 at 11:00 AM EDT by a video enabled telemedicine application and verified that I am speaking with the correct person using two identifiers.  I completed visit in my office and patient was in her home.    Lindell Noe, LPN

## 2018-06-14 NOTE — Progress Notes (Signed)
PCP notes:   Health maintenance:  Foot exam - postponed  Abnormal screenings:   None  Patient concerns:   Bilateral knee pain - 10/10; uses topical analgesics; ambulates with walker; does not exercise; obesity with active weight loss management Alexandria [name])  Nurse concerns:  None  Next PCP appt:   06/27/18 @ 1200

## 2018-06-14 NOTE — Progress Notes (Signed)
Subjective:   Alexandria [name] is a 83 y.o. female who presents for Medicare Annual (Subsequent) preventive examination.  Review of Systems:  N/A Cardiac Risk Factors include: advanced age (>54men, >10 women);diabetes mellitus;dyslipidemia;hypertension;obesity (BMI >30kg/m2)     Objective:     Vitals: There were no vitals taken for this visit.  There is no height or weight on file to calculate BMI.  Advanced Directives 06/14/2018 10/01/2016 08/08/2016 02/12/2016 02/09/2016 09/27/2014 07/31/2014  Does Patient Have a Medical Advance Directive? Yes Yes Yes Yes Yes Yes Yes  Type of Paramedic of [place];Living will Living will [place];Living will [place];Living will [place];Living will - [place];Living will  Does patient want to make changes to medical advance directive? - No - Patient declined Yes (ED - Information included in AVS) - - - -  Copy of [place] in Chart? No - copy requested - No - copy requested No - copy requested No - copy requested - -  Would patient like information on creating a medical advance directive? No - Patient declined - - - - - -  Pre-existing out of facility DNR order (yellow form or pink MOST form) - - - - - - -    Tobacco Social History   Tobacco Use  Smoking Status Former Smoker   Packs/day: 2.00   Years: 50.00   Pack years: 100.00   Types: Cigarettes   Last attempt to quit: 02/05/2005   Years since quitting: 13.3  Smokeless Tobacco Never Used     Counseling given: No   Clinical Intake:  Pre-visit preparation completed: Yes  Pain : 0-10 Pain Score: 10-Worst pain ever Pain Type: Chronic pain Pain Location: Knee Pain Orientation: Left, Right Pain Descriptors / Indicators: Constant Pain Onset: More than a month ago Pain Frequency: Constant Pain Relieving Factors: topical analgesics  Pain Relieving Factors: topical  analgesics  Nutritional Status: BMI > 30  Obese Nutritional Risks: None Diabetes: Yes CBG done?: No Did pt. bring in CBG monitor from home?: No  How often do you need to have someone help you when you read instructions, pamphlets, or other written materials from your doctor or pharmacy?: 1 - Never What is the last grade level you completed in school?: 12th grade  Interpreter Needed?: No  Comments: pt lives with spouse Information entered by :: LPinson, LPN  Past Medical History:  Diagnosis Date   Allergy, unspecified not elsewhere classified    Chronic diastolic congestive heart failure (HCC)    COPD (chronic obstructive pulmonary disease) ([place]) 03/08/1998   PFTs 12/12/2002 FEV 1 1.42 (64%) ratio 58 with no better after B2 and DLCO75%; PFTs 11/20/09 FEV1 1.50 (73%) ratio 50 no better after B2 with DLCO 62%; Hfa 50% 11/20/2009 >75%, 01/13/10 p coaching   Depression 12/07/1998   Diabetes mellitus type II 02/05/2006   Dr. Cruzita Lederer with endo   Disorders of bursae and tendons in shoulder region, unspecified    Rotator cuff syndrome, right   E. coli bacteremia    Esophagitis    GERD (gastroesophageal reflux disease)    History of UTI    Hyperlipidemia 10/04/2000   Hypertension 03/08/1992   Hypothyroidism 03/08/1968   Iron deficiency anemia    Obesity    NOS   OSA (obstructive sleep apnea)    PSG 01/27/10 AHI 13, pt does not know CPAP settings   Peripheral neuropathy    Likely due to DM  per Dr. Murvin Natal hernia    Past Surgical History:  Procedure Laterality Date   ABD U/S  03/19/1999   Nml x2 foci in liver   ADENOSINE MYOVIEW  06/02/2007   Nml   CARDIOLITE PERSANTINE  08/24/2000   Nml   CAROTID U/S  08/24/2000   1-39% ICA stenosis   CAROTID U/S  06/02/2007   No apprec change    CARPAL TUNNEL RELEASE  12/1997   Right   CESAREAN SECTION     x2 [place]/ repeat   CHOLECYSTECTOMY  1997   COLONOSCOPY WITH PROPOFOL N/A 02/12/2016   Procedure:  COLONOSCOPY WITH PROPOFOL;  Surgeon: Milus Banister, MD;  Location: [facility];  Service: Endoscopy;  Laterality: N/A;   CT ABD W & PELVIS WO/W CM     Abd hemangiomas of liver, 1 cm R renal cyst   DENTAL SURGERY  2016   Implants   DEXA  07/03/2003   Nml   ESOPHAGOGASTRODUODENOSCOPY  12/05/1997   Nml (due to hoarseness)   ESOPHAGOGASTRODUODENOSCOPY (EGD) WITH PROPOFOL N/A 02/12/2016   Procedure: ESOPHAGOGASTRODUODENOSCOPY (EGD) WITH PROPOFOL;  Surgeon: Milus Banister, MD;  Location: [facility];  Service: Endoscopy;  Laterality: N/A;   GALLBLADDER SURGERY     HERNIA REPAIR  01/24/2009   Lap Ventr w/ Lysis of adhesions (Dr. Donne Hazel)   knee arthroscopic surgery  years ago   right   ROTATOR CUFF REPAIR  1984   Right, [place]   SHOULDER OPEN ROTATOR CUFF REPAIR  02/08/2012   Procedure: ROTATOR CUFF REPAIR SHOULDER OPEN;  Surgeon: Magnus Sinning, MD;  Location: [facility];  Service: Orthopedics;  Laterality: Left;  Left Shoulder Open Anterior Acrominectomy Rotator Cuff Repair Open Distal Clavicle Resection ,tissue mend graft, and repair of biceps tendon   THUMB RELEASE  12/1997   Right   TONSILLECTOMY     TOTAL ABDOMINAL HYSTERECTOMY  1985   Due to dysmennorhea   US ECHOCARDIOGRAPHY  06/02/2007   Family History  Problem Relation Age of Onset   Heart disease Mother    Thyroid disease Mother    Emphysema Father        One lung   Cystic fibrosis Sister    Hyperthyroidism Sister    Osteoarthritis Brother    Hyperthyroidism Brother    Esophageal cancer Neg Hx    Cancer Neg Hx        Head or neck   Colon cancer Neg Hx    Stomach cancer Neg Hx    Breast cancer Neg Hx    Social History   Socioeconomic History   Marital status: Married    Spouse name: Not on file   Number of children: 2   Years of education: Not on file   Highest education level: Not on file  Occupational History   Occupation: Retired    Fish farm manager: RETIRED  Airline pilot strain: Not on file   Food insecurity:    Worry: Not on file    Inability: Not on file   Transportation needs:    Medical: Not on file    Non-medical: Not on file  Tobacco Use   Smoking status: Former Smoker    Packs/day: 2.00    Years: 50.00    Pack years: 100.00    Types: Cigarettes    Last attempt to quit: 02/05/2005    Years since quitting: 13.3   Smokeless tobacco: Never Used  Substance and Sexual Activity   Alcohol use:  Yes    Frequency: Never    Comment: occasionally   Drug use: Never   Sexual activity: Not Currently  Lifestyle   Physical activity:    Days per week: Not on file    Minutes per session: Not on file   Stress: Not on file  Relationships   Social connections:    Talks on phone: Not on file    Gets together: Not on file    Attends religious service: Not on file    Active member of club or organization: Not on file    Attends meetings of clubs or organizations: Not on file    Relationship status: Not on file  Other Topics Concern   Not on file  Social History Narrative   Married with 2 children   Enjoys painting    Outpatient Encounter Medications as of 06/14/2018  Medication Sig   albuterol (PROAIR HFA) 108 (90 Base) MCG/ACT inhaler Inhale 2 puffs into the lungs every 6 (six) hours as needed for wheezing or shortness of breath. INHALE 2 PUFFS INTO THE LUNGS EVERY 6 HOURS AS NEEDED FOR WHEEZING OR SHORTNESS OF BREATH   budesonide-formoterol (SYMBICORT) 160-4.5 MCG/ACT inhaler Inhale 2 puffs into the lungs 2 (two) times daily.   busPIRone (BUSPAR) 5 MG tablet    Coenzyme Q-10 200 MG CAPS Take 200 mg by mouth daily.    EPINEPHrine (EPIPEN) 0.3 mg/0.3 mL DEVI Inject 0.3 mg into the muscle once as needed (anaphylaxis).    esomeprazole (NEXIUM) 40 MG capsule Take 40 mg by mouth every other day.    ezetimibe (ZETIA) 10 MG tablet Take 1 tablet (10 mg total) by mouth at bedtime.   ferrous sulfate 325 (65 FE) MG EC tablet  Take 1 tablet (325 mg total) by mouth daily with breakfast.   fexofenadine (ALLEGRA) 180 MG tablet Take 180 mg by mouth as needed.    FLUoxetine (PROZAC) 20 MG capsule Take 20 mg by mouth 2 (two) times daily.   fluticasone (FLONASE) 50 MCG/ACT nasal spray USE 2 SPRAYS INTO THE NOSE EVERY 12 HOURS AS NEEDED FOR STUFFY NOSE   Fluticasone Furoate (FLONASE SENSIMIST NA) as needed.    furosemide (LASIX) 20 MG tablet Take 1 tablet (20 mg total) by mouth daily as needed for fluid.   glucose blood (ONETOUCH VERIO) test strip Use as instructed to check sugar 2-3x time daily   ibuprofen (ADVIL,MOTRIN) 200 MG tablet Take 200-400 mg by mouth every 6 (six) hours as needed for moderate pain.    metFORMIN (GLUCOPHAGE) 500 MG tablet TAKE 1 TABLET (500 MG TOTAL) BY MOUTH DAILY WITH SUPPER.   nitroGLYCERIN (NITROSTAT) 0.4 MG SL tablet Place 1 tablet (0.4 mg total) under the tongue every 5 (five) minutes as needed for chest pain.   ONETOUCH DELICA LANCETS FINE MISC USE TO CHECK SUGAR 1 TIME DAILY   ONETOUCH VERIO test strip USE AS INSTRUCTED TO CHECK SUGAR 1 TIME DAILY   potassium chloride (K-DUR) 10 MEQ tablet Take 1 tablet (10 mEq total) by mouth daily as needed (Takes with furosemide doses.).   PROAIR HFA 108 (90 Base) MCG/ACT inhaler INHALE 2 PUFFS INTO THE LUNGS EVERY 6 HOURS AS NEEDED FOR WHEEZING OR SHORTNESS OF BREATH   rosuvastatin (CRESTOR) 10 MG tablet TAKE 1 TABLET (10 MG TOTAL) BY MOUTH EVERY OTHER DAY.   Spacer/Aero Chamber [employer] Use with  inhaler as needed.  J44.9   SYNTHROID 125 MCG tablet TAKE 1 TABLET (125 MCG TOTAL) BY MOUTH DAILY  BEFORE BREAKFAST.   valsartan (DIOVAN) 160 MG tablet Take 1 tablet (160 mg total) by mouth 2 (two) times daily.   vitamin B-12 (CYANOCOBALAMIN) 50 MCG tablet Take 50 mcg by mouth daily.    Vitamin D, Ergocalciferol, (DRISDOL) 1.25 MG (50000 UT) CAPS capsule Take 1 capsule (50,000 Units total) by mouth every 7 (seven) days.    Azelastine-Fluticasone 137-50 MCG/ACT SUSP Place 2 puffs into the nose 1 day or 1 dose for 1 dose.   [DISCONTINUED] FLUoxetine (PROZAC) 10 MG capsule TAKE 3 CAPSULES IN THE MORNING AND TAKE 3 CAPSULES IN THE EVENING   [DISCONTINUED] LORazepam (ATIVAN) 0.5 MG tablet Take 1 tablet (0.5 mg total) by mouth 2 (two) times daily as needed for anxiety.   No facility-administered encounter medications on file as of 06/14/2018.     Activities of Daily Living In your present state of health, do you have any difficulty performing the following activities: 06/14/2018  Hearing? N  Vision? N  Difficulty concentrating or making decisions? N  Walking or climbing stairs? Y  Dressing or bathing? N  Doing errands, shopping? Y  Preparing Food and eating ? N  Using the Toilet? N  In the past six months, have you accidently leaked urine? N  Do you have problems with loss of bowel control? N  Managing your Medications? N  Managing your Finances? N  Housekeeping or managing your Housekeeping? N  Some recent data might be hidden    Patient Care Team: Tonia Ghent, MD as PCP - General Rockey Situ Kathlene November, MD as Consulting Physician (Cardiology)    Assessment:   This is a routine wellness examination for [place].  Vision Screening Comments: Vision exam in 2019 with Dr. Delman Cheadle  Exercise Activities and Dietary recommendations Current Exercise Habits: The patient does not participate in regular exercise at present, Exercise limited by: orthopedic condition(s);respiratory conditions(s)  Goals     Patient Stated     Starting 06/14/18, I will continue to take medications as prescribed.        Fall Risk Fall Risk  06/14/2018 02/09/2017 01/15/2016 07/29/2015 06/13/2014  Falls in the past year? 0 Yes Yes Yes No  Number falls in past yr: - - 1 - -   Depression Screen PHQ 2/9 Scores 06/14/2018 02/09/2017 01/15/2016 07/29/2015  PHQ - 2 Score 1 4 0 0  PHQ- 9 Score 1 19 - -     Cognitive Function  Mini-Cog was not  completed. Virtual video appointment.       Immunization History  Administered Date(s) Administered   DT 12/23/2010   H1N1 05/30/2008   Influenza Split 12/23/2010, 12/16/2011, 01/08/2013   Influenza Whole 12/07/1995, 12/07/2005, 01/04/2008, 01/06/2010   Influenza, High Dose Seasonal PF 01/06/2016, 01/06/2017   Influenza,inj,Quad PF,6+ Mos 12/26/2013, 12/05/2014, 11/28/2017   PPD Test 10/02/2014   Pneumococcal Conjugate-13 08/17/2013   Pneumococcal Polysaccharide-23 12/06/2000, 03/08/2005, 10/24/2008   Td 03/08/1993, 10/04/2000, 12/23/2010   Tdap 10/01/2016   Zoster 12/07/2005    Screening Tests Health Maintenance  Topic Date Due   FOOT EXAM  02/09/2018   HEMOGLOBIN A1C  06/23/2018   INFLUENZA VACCINE  10/07/2018   OPHTHALMOLOGY EXAM  02/16/2019   TETANUS/TDAP  10/02/2026   DEXA SCAN  Completed   PNA vac Low Risk Adult  Completed       Plan:     I have personally reviewed, addressed, and noted the following in the patients chart:  A. Medical and social history B. Use of alcohol, tobacco or illicit  drugs  C. Current medications and supplements D. Functional ability and status E.  Nutritional status F.  Physical activity G. Advance directives H. List of other physicians I.  Hospitalizations, surgeries, and ER visits in previous 12 months J.  [place] to include hearing, vision, cognitive, depression L. Referrals and appointments - none  In addition, I have reviewed and discussed with patient certain preventive protocols, quality metrics, and best practice recommendations. A written personalized care plan for preventive services as well as general preventive health recommendations were provided to patient.  See attached scanned questionnaire for additional information.   Signed,   Lindell Noe, MHA, BS, LPN Health Coach

## 2018-06-14 NOTE — Progress Notes (Signed)
I reviewed health advisor's note, was available for consultation, and agree with documentation and plan.   Signed,  Jidenna Figgs T. Raul Winterhalter, MD  

## 2018-06-14 NOTE — Patient Instructions (Signed)
Alexandria [name] , Thank you for taking time to come for your Medicare Wellness Visit. I appreciate your ongoing commitment to your health goals. Please review the following plan we discussed and let me know if I can assist you in the future.   These are the goals we discussed: Goals    . Patient Stated     Starting 06/14/18, I will continue to take medications as prescribed.        This is a list of the screening recommended for you and due dates:  Health Maintenance  Topic Date Due  . Complete foot exam   03/08/2019*  . Hemoglobin A1C  06/23/2018  . Flu Shot  10/07/2018  . Eye exam for diabetics  02/16/2019  . Tetanus Vaccine  10/02/2026  . DEXA scan (bone density measurement)  Completed  . Pneumonia vaccines  Completed  *Topic was postponed. The date shown is not the original due date.   Preventive Care for Adults  A healthy lifestyle and preventive care can promote health and wellness. Preventive health guidelines for adults include the following key practices.  . A routine yearly physical is a good way to check with your health care provider about your health and preventive screening. It is a chance to share any concerns and updates on your health and to receive a thorough exam.  . Visit your dentist for a routine exam and preventive care every 6 months. Brush your teeth twice a day and floss once a day. Good oral hygiene prevents tooth decay and gum disease.  . The frequency of eye exams is based on your age, health, family medical history, use  of contact lenses, and other factors. Follow your health care provider's recommendations for frequency of eye exams.  . Eat a healthy diet. Foods like vegetables, fruits, whole grains, low-fat dairy products, and lean protein foods contain the nutrients you need without too many calories. Decrease your intake of foods high in solid fats, added sugars, and salt. Eat the right amount of calories for you. Get information about a proper diet from  your health care provider, if necessary.  . Regular physical exercise is one of the most important things you can do for your health. Most adults should get at least 150 minutes of moderate-intensity exercise (any activity that increases your heart rate and causes you to sweat) each week. In addition, most adults need muscle-strengthening exercises on 2 or more days a week.  Silver Sneakers may be a benefit available to you. To determine eligibility, you may visit the website: www.silversneakers.com or contact program at [phone] Mon-Fri between 8AM-8PM.   . Maintain a healthy weight. The body mass index (BMI) is a screening tool to identify possible weight problems. It provides an estimate of body fat based on height and weight. Your health care provider can find your BMI and can help you achieve or maintain a healthy weight.   For adults 20 years and older: ? A BMI below 18.5 is considered underweight. ? A BMI of 18.5 to 24.9 is normal. ? A BMI of 25 to 29.9 is considered overweight. ? A BMI of 30 and above is considered obese.   . Maintain normal blood lipids and cholesterol levels by exercising and minimizing your intake of saturated fat. Eat a balanced diet with plenty of fruit and vegetables. Blood tests for lipids and cholesterol should begin at age 30 and be repeated every 5 years. If your lipid or cholesterol levels are high, you are  over 65, or you are at high risk for heart disease, you may need your cholesterol levels checked more frequently. Ongoing high lipid and cholesterol levels should be treated with medicines if diet and exercise are not working.  . If you smoke, find out from your health care provider how to quit. If you do not use tobacco, please do not start.  . If you choose to drink alcohol, please do not consume more than 2 drinks per day. One drink is considered to be 12 ounces (355 mL) of beer, 5 ounces (148 mL) of wine, or 1.5 ounces (44 mL) of liquor.  . If you  are 37-46 years old, ask your health care provider if you should take aspirin to prevent strokes.  . Use sunscreen. Apply sunscreen liberally and repeatedly throughout the day. You should seek shade when your shadow is shorter than you. Protect yourself by wearing long sleeves, pants, a wide-brimmed hat, and sunglasses year round, whenever you are outdoors.  . Once a month, do a whole body skin exam, using a mirror to look at the skin on your back. Tell your health care provider of new moles, moles that have irregular borders, moles that are larger than a pencil eraser, or moles that have changed in shape or color.

## 2018-06-15 ENCOUNTER — Telehealth: Payer: Self-pay

## 2018-06-15 MED ORDER — SYNTHROID 125 MCG PO TABS: ORAL_TABLET | ORAL | 1 refills | Status: DC

## 2018-06-15 NOTE — Telephone Encounter (Signed)
-----   Message from Philemon Kingdom, MD sent at 06/14/2018  5:05 PM EDT ----- Regarding: RE: Medication Refill Alexandria [name], Can you please give her a call and see what the pb is? Ty, C ----- Message ----- From: Eustace Pen, LPN Sent: 0/03/6427  03:79 PM EDT To: Tonia Ghent, MD, Philemon Kingdom, MD Subject: Medication Refill                              Dr. Cruzita Lederer,  During video encounter, patient verbalized concerns that Synthroid was denied by insurance. Patient would like to speak with someone at your office regarding this medication. Patient has limited supply of medication available.   Thank you for your attention to this matter.  Katha Cabal

## 2018-06-15 NOTE — Telephone Encounter (Signed)
It needs a PA which was started, however I need information from the patient but she never got back to me. I will reach out again today.

## 2018-06-17 ENCOUNTER — Other Ambulatory Visit: Payer: Self-pay | Admitting: Family Medicine

## 2018-06-19 ENCOUNTER — Other Ambulatory Visit: Payer: Self-pay | Admitting: Family Medicine

## 2018-06-19 ENCOUNTER — Telehealth: Payer: Self-pay | Admitting: Internal Medicine

## 2018-06-19 ENCOUNTER — Telehealth: Payer: Self-pay

## 2018-06-19 DIAGNOSIS — I10 Essential (primary) hypertension: Secondary | ICD-10-CM

## 2018-06-19 DIAGNOSIS — E559 Vitamin D deficiency, unspecified: Secondary | ICD-10-CM

## 2018-06-19 DIAGNOSIS — E119 Type 2 diabetes mellitus without complications: Secondary | ICD-10-CM

## 2018-06-19 DIAGNOSIS — M8580 Other specified disorders of bone density and structure, unspecified site: Secondary | ICD-10-CM

## 2018-06-19 DIAGNOSIS — E611 Iron deficiency: Secondary | ICD-10-CM

## 2018-06-19 NOTE — Telephone Encounter (Signed)
See other phone note.  I tried to submit the PA, it was canceled and the reason why is:  This request has received a Cancelled outcome.  This may mean either your patient does not have active coverage with this plan, this authorization was processed as a duplicate request, or an authorization was not needed for this medication.  Note any additional information provided by [facility] at the bottom of this request, and contact [employer] [place] directly for further.  Left message for patient to return our call at [phone].

## 2018-06-19 NOTE — Telephone Encounter (Signed)
Per [facility], "Called and said insurance would not pay for Synthroid and there was a ph# that the provider could call and ask the insurance to approve it. She will be out in less than a week and would like a refill and wants to know why she has not gotten a return call from the office. She is going Wednesday to have test done in a tent and wants to know if they can check her A1C."

## 2018-06-19 NOTE — Telephone Encounter (Signed)
I have tried getting a hold of her and she has not responded. I can not finish the PA without asking her some questions her insurance wants.  Left message for patient to return our call at [phone].

## 2018-06-19 NOTE — Telephone Encounter (Signed)
I went ahead and tried to send the PA, her insurance canceled it and here is the message they gave me:  This request has received a Cancelled outcome.  This may mean either your patient does not have active coverage with this plan, this authorization was processed as a duplicate request, or an authorization was not needed for this medication.  Note any additional information provided by [facility] at the bottom of this request, and contact [employer] [place] directly for further.

## 2018-06-19 NOTE — Telephone Encounter (Signed)
Left detailed VM with COVID screen and curbside info  

## 2018-06-20 ENCOUNTER — Other Ambulatory Visit (INDEPENDENT_AMBULATORY_CARE_PROVIDER_SITE_OTHER): Payer: BLUE CROSS/BLUE SHIELD

## 2018-06-20 ENCOUNTER — Other Ambulatory Visit: Payer: Self-pay

## 2018-06-20 ENCOUNTER — Ambulatory Visit: Payer: BLUE CROSS/BLUE SHIELD

## 2018-06-20 ENCOUNTER — Ambulatory Visit: Payer: BLUE CROSS/BLUE SHIELD | Admitting: Internal Medicine

## 2018-06-20 ENCOUNTER — Telehealth: Payer: Self-pay

## 2018-06-20 DIAGNOSIS — I10 Essential (primary) hypertension: Secondary | ICD-10-CM | POA: Diagnosis not present

## 2018-06-20 DIAGNOSIS — E119 Type 2 diabetes mellitus without complications: Secondary | ICD-10-CM | POA: Diagnosis not present

## 2018-06-20 DIAGNOSIS — E559 Vitamin D deficiency, unspecified: Secondary | ICD-10-CM

## 2018-06-20 DIAGNOSIS — E611 Iron deficiency: Secondary | ICD-10-CM

## 2018-06-20 LAB — CBC WITH DIFFERENTIAL/PLATELET
Basophils Absolute: 0 10*3/uL (ref 0.0–0.1)
Basophils Relative: 0.5 % (ref 0.0–3.0)
Eosinophils Absolute: 0.2 10*3/uL (ref 0.0–0.7)
Eosinophils Relative: 2.5 % (ref 0.0–5.0)
HCT: 34.2 % — ABNORMAL LOW (ref 36.0–46.0)
Hemoglobin: 11.4 g/dL — ABNORMAL LOW (ref 12.0–15.0)
Lymphocytes Relative: 23.8 % (ref 12.0–46.0)
Lymphs Abs: 1.7 10*3/uL (ref 0.7–4.0)
MCHC: 33.2 g/dL (ref 30.0–36.0)
MCV: 92.7 fl (ref 78.0–100.0)
Monocytes Absolute: 0.6 10*3/uL (ref 0.1–1.0)
Monocytes Relative: 8.4 % (ref 3.0–12.0)
Neutro Abs: 4.5 10*3/uL (ref 1.4–7.7)
Neutrophils Relative %: 64.8 % (ref 43.0–77.0)
Platelets: 211 10*3/uL (ref 150.0–400.0)
RBC: 3.69 Mil/uL — ABNORMAL LOW (ref 3.87–5.11)
RDW: 14 % (ref 11.5–15.5)
WBC: 7 10*3/uL (ref 4.0–10.5)

## 2018-06-20 LAB — HEMOGLOBIN A1C: Hgb A1c MFr Bld: 5.5 % (ref 4.6–6.5)

## 2018-06-20 LAB — COMPREHENSIVE METABOLIC PANEL
ALT: 11 U/L (ref 0–35)
AST: 13 U/L (ref 0–37)
Albumin: 3.9 g/dL (ref 3.5–5.2)
Alkaline Phosphatase: 83 U/L (ref 39–117)
BUN: 27 mg/dL — ABNORMAL HIGH (ref 6–23)
CO2: 32 mEq/L (ref 19–32)
Calcium: 9.5 mg/dL (ref 8.4–10.5)
Chloride: 104 mEq/L (ref 96–112)
Creatinine, Ser: 0.98 mg/dL (ref 0.40–1.20)
GFR: 54.5 mL/min — ABNORMAL LOW (ref >60.00)
Glucose, Bld: 111 mg/dL — ABNORMAL HIGH (ref 70–99)
Potassium: 5.5 mEq/L — ABNORMAL HIGH (ref 3.5–5.1)
Sodium: 142 mEq/L (ref 135–145)
Total Bilirubin: 0.3 mg/dL (ref 0.2–1.2)
Total Protein: 7 g/dL (ref 6.0–8.3)

## 2018-06-20 LAB — IBC PANEL
Iron: 40 ug/dL — ABNORMAL LOW (ref 42–145)
Saturation Ratios: 12.5 % — ABNORMAL LOW (ref 20.0–50.0)
Transferrin: 229 mg/dL (ref 212.0–360.0)

## 2018-06-20 LAB — VITAMIN D 25 HYDROXY (VIT D DEFICIENCY, FRACTURES): VITD: 39.41 ng/mL (ref 30.00–100.00)

## 2018-06-20 LAB — LIPID PANEL
Cholesterol: 123 mg/dL (ref 0–200)
HDL: 49.5 mg/dL (ref >39.00)
LDL Cholesterol: 53 mg/dL (ref 0–99)
NonHDL: 73.08
Total CHOL/HDL Ratio: 2
Triglycerides: 102 mg/dL (ref 0.0–149.0)
VLDL: 20.4 mg/dL (ref 0.0–40.0)

## 2018-06-20 NOTE — Telephone Encounter (Signed)
Called patient from recall list.  Spoke with patient.  Offered her a telehealth appointment.  Patient denied and wanted to push out until august.  Recall placed for August.

## 2018-06-20 NOTE — Telephone Encounter (Signed)
Spoke with patient and got her updated insurance information, submitted the PA and it has been approved.  RX sent again with approval number but need to confirm which pharmacy in her chart she wants it sent to.  Left message for patient to return our call at [phone].

## 2018-06-21 ENCOUNTER — Other Ambulatory Visit: Payer: BLUE CROSS/BLUE SHIELD

## 2018-06-22 MED ORDER — SYNTHROID 125 MCG PO TABS: ORAL_TABLET | ORAL | 2 refills | Status: DC

## 2018-06-22 NOTE — Telephone Encounter (Signed)
Patient has not called back so I sent it to the [facility] in her chart.

## 2018-06-26 ENCOUNTER — Telehealth: Payer: Self-pay | Admitting: Pulmonary Disease

## 2018-06-26 NOTE — Telephone Encounter (Signed)
Contacted patient regarding her message re: sleep study results.  Patient confirms she is trying to get results of the last ONO.  Patient reports she has had two ONO tests in March 2020 and was told by [employer] that both tests 'failed' due to no data. Patient states she thinks her inogen machine is somehow interring with these results but difficult to understand what she means by this.  Advised patient we would follow up on results with [employer] and then update her.  She wants to know if Dr. Halford Chessman has reviewed her ONO information.   Contacted Rhonda at [facility] [employer].  Verified that patient had two ONO studies in March, the last one 05/24/18 and both tests failed to show any data so no report was generated.  The sensor did not produce details for report though patient reported to [employer] that she never removed the sensor from her finger.  [employer] contacted patient on 05/31/18 to schedule a third re-test but the patient declined, stating she did not wish to reschedule.  [employer] did confirm that there are 'no results' to report to the MD.    ATC patient back regarding no results for Dr. Halford Chessman to review.  Will route this to Dr. Halford Chessman and Bradley Ferris for follow up.   Dr. Halford Chessman please advise.  Patient does not want to reschedule the ONO without input from MD.  Thank you

## 2018-06-27 ENCOUNTER — Ambulatory Visit (INDEPENDENT_AMBULATORY_CARE_PROVIDER_SITE_OTHER): Payer: BLUE CROSS/BLUE SHIELD | Admitting: Family Medicine

## 2018-06-27 ENCOUNTER — Encounter: Payer: Self-pay | Admitting: Family Medicine

## 2018-06-27 VITALS — Temp 97.6°F | Ht 65.0 in | Wt 216.1 lb

## 2018-06-27 DIAGNOSIS — F339 Major depressive disorder, recurrent, unspecified: Secondary | ICD-10-CM | POA: Diagnosis not present

## 2018-06-27 DIAGNOSIS — D509 Iron deficiency anemia, unspecified: Secondary | ICD-10-CM | POA: Diagnosis not present

## 2018-06-27 DIAGNOSIS — J9611 Chronic respiratory failure with hypoxia: Secondary | ICD-10-CM | POA: Diagnosis not present

## 2018-06-27 DIAGNOSIS — E782 Mixed hyperlipidemia: Secondary | ICD-10-CM | POA: Diagnosis not present

## 2018-06-27 DIAGNOSIS — E559 Vitamin D deficiency, unspecified: Secondary | ICD-10-CM | POA: Diagnosis not present

## 2018-06-27 DIAGNOSIS — E1140 Type 2 diabetes mellitus with diabetic neuropathy, unspecified: Secondary | ICD-10-CM | POA: Diagnosis not present

## 2018-06-27 DIAGNOSIS — R918 Other nonspecific abnormal finding of lung field: Secondary | ICD-10-CM | POA: Diagnosis not present

## 2018-06-27 DIAGNOSIS — E875 Hyperkalemia: Secondary | ICD-10-CM | POA: Diagnosis not present

## 2018-06-27 DIAGNOSIS — I10 Essential (primary) hypertension: Secondary | ICD-10-CM

## 2018-06-27 MED ORDER — NITROGLYCERIN 0.4 MG SL SUBL: 0.4000 mg | SUBLINGUAL_TABLET | SUBLINGUAL | 3 refills | Status: DC | PRN

## 2018-06-27 MED ORDER — VALSARTAN 160 MG PO TABS: 80.0000 mg | ORAL_TABLET | Freq: Two times a day (BID) | ORAL | Status: DC

## 2018-06-27 MED ORDER — VITAMIN D 50 MCG (2000 UT) PO TABS: 2000.0000 [IU] | ORAL_TABLET | Freq: Every day | ORAL | Status: DC

## 2018-06-27 NOTE — Progress Notes (Signed)
Virtual visit completed through WebEx or similar program Patient location: home  Provider location: [place] at [facility], office   Limitations and rationale for visit method d/w patient.  Patient agreed to proceed.   CC: follow up  HPI:  She has f/u with pulmonary re: nodule pending for June.  I'll defer. She agrees.   Hyperkalemia.  Takes K when taking lasix.  Taking lasix/potassium only rarely and not in the last few months.  She is on ARB, see below.      She has urinary frequency at baseline, q2-3 hour urinary frequency.  Has been going on for about 2 weeks.  No burning with urination.  No fevers.  Would observe for now.  Hyperglycemia.  Hasn't checked sugar recently.  A1c controlled.  We talked about avoiding hypoglycemia.   She'll start back checking her sugar and update me as needed.    Anemia.  HGB slightly low but stable, iron low.  Still on iron daily.  No blood in stools.  We talked about considerations re: w/u, with age, pulmonary status and pandemic.  I would defer further w/u for now. She agrees.    Elevated Cholesterol: Using medications without problems: yes Muscle aches: no Diet compliance: yes  Exercise: limited by oxygen requirement.    Lipids reasonable.  D/w pt.   Hypertension:    Using medication without problems or lightheadedness: She has lightheadedness over the last few weeks.  I asked her to check her BP and update me.  Intentional weight loss noted.    Chest pain with exertion: no Edema:no Short of breath: still on O2 at baseline.  She isn't SOB now.   She needed a refill on NTG. No recent use.  Just needed in date rx.  No use ever.   D/w pt about cutting valsartan in half and have her update me about her BP in a few days.  Recheck BMET next week, scheduled.   Vit D def hx noted, now wnl, d/w pt. Change vit D to 2000 units a day and recheck in about 3 months.   Stop high dose weekly replacement for now.   Pulmonary disease- awaiting in put from  pulmonary re: ONO testing.  I'll defer.  Mood d/w pt.  She is on prozac 20mg  BID with rare use of buspar.  Mood is good per patient report.  Affect wnl during interview.  Speech wnl.   Meds and allergies reviewed.   ROS: Per HPI unless specifically indicated in ROS section   NAD Speech wnl On O2 via [place] at baseline  A/P:  She has f/u with pulmonary re: nodule pending for June.  I'll defer. She agrees.   Hyperkalemia.  Takes K when taking lasix.  Taking lasix/potassium only rarely and not in the last few months.  She is on ARB, see below.      Hyperglycemia versus diabetes.  Hasn't checked sugar recently.  A1c controlled.  We talked about avoiding hypoglycemia.   She'll start back checking her sugar and update me as needed.    Anemia.  HGB slightly low but stable, iron low.  Still on iron daily.  No blood in stools.  We talked about considerations re: w/u, with age, pulmonary status and pandemic.  I would defer further w/u for now. She agrees.    Elevated Cholesterol: Lipids reasonable.  D/w pt. continue as is  Hypertension:  Intentional weight loss noted.  Episodically lightheaded.  D/w pt about cutting valsartan in half and have  her update me about her BP in a few days.  Recheck BMET next week, scheduled.  She agrees with plan.  Vit D def hx noted, now wnl, d/w pt. Change vit D to 2000 units a day and recheck in about 3 months.   Stop high dose weekly replacement for now.   Pulmonary disease- awaiting in put from pulmonary re: ONO testing.  I'll defer.  Mood d/w pt.  She is on prozac 20mg  BID with rare use of buspar.  Mood is good per patient report.  Affect wnl during interview.  Speech wnl.   Need labs visit for BMET on 07/06/2018 at 1045 AM. She doesn't need vit D for another 3 months.   Only needs BMET done on 4/30.    >40 minutes spent in face to face time with patient, >50% spent in counselling or coordination of care

## 2018-06-28 DIAGNOSIS — E875 Hyperkalemia: Secondary | ICD-10-CM | POA: Insufficient documentation

## 2018-06-28 NOTE — Assessment & Plan Note (Signed)
Takes K when taking lasix.  Taking lasix/potassium only rarely and not in the last few months.  She is on ARB, see below.   It may be that cutting back on ARB resolves this.  We can recheck labs in the near future.  Discussed.

## 2018-06-28 NOTE — Assessment & Plan Note (Signed)
She has f/u with pulmonary re: nodule pending for June.  I'll defer. She agrees.

## 2018-06-29 DIAGNOSIS — E559 Vitamin D deficiency, unspecified: Secondary | ICD-10-CM | POA: Insufficient documentation

## 2018-06-29 NOTE — Assessment & Plan Note (Signed)
Hypertension:  Intentional weight loss noted.  Episodically lightheaded.  D/w pt about cutting valsartan in half and have her update me about her BP in a few days.  Recheck BMET next week, scheduled.  She agrees with plan.

## 2018-06-29 NOTE — Assessment & Plan Note (Signed)
  Vit D def hx noted, now wnl, d/w pt. Change vit D to 2000 units a day and recheck in about 3 months.   Stop high dose weekly replacement for now.

## 2018-06-29 NOTE — Assessment & Plan Note (Signed)
Mood d/w pt.  She is on prozac 20mg  BID with rare use of buspar.  Mood is good per patient report.  Affect wnl during interview.  Speech wnl.  Continue as is.

## 2018-06-29 NOTE — Assessment & Plan Note (Signed)
Hyperglycemia versus diabetes.  Hasn't checked sugar recently.  A1c controlled.  We talked about avoiding hypoglycemia.   She'll start back checking her sugar and update me as needed.

## 2018-06-29 NOTE — Assessment & Plan Note (Signed)
Pulmonary disease- awaiting in put from pulmonary re: ONO testing.  I'll defer.

## 2018-06-29 NOTE — Assessment & Plan Note (Signed)
HGB slightly low but stable, iron low.  Still on iron daily.  No blood in stools.  We talked about considerations re: w/u, with age, pulmonary status and pandemic.  I would defer further w/u for now. She agrees.

## 2018-06-29 NOTE — Assessment & Plan Note (Signed)
Elevated Cholesterol: Lipids reasonable.  D/w pt. continue as is

## 2018-06-30 NOTE — Telephone Encounter (Signed)
Call made to [employer], spoke with Alexandria [name], made aware the ONO has been done twice and the patient will need it done a 3rd time. She states they made an extra effort to teach her what to do the second time around. Per the report Alexandria [name] states she is not keeping the sensor on long enough for any data to record. She states an option would be having the patient repeat her cpap titration study at the sleep lab that way they can monitor her oxygen while titrating the cpap.   Call made to patient, made aware of the above. She states she does not understand why that keeps happening because she has the sensor on her finger all night. Made aware VS is at hospital and we would send her message to app of the day. She declined stating she would rather wait until she can get recommendations from VS. She states she is willing to wait however long it takes.   VS please advise. Patient aware you are not in clinic. Thanks.

## 2018-07-05 DIAGNOSIS — F4322 Adjustment disorder with anxiety: Secondary | ICD-10-CM | POA: Diagnosis not present

## 2018-07-06 ENCOUNTER — Other Ambulatory Visit: Payer: Self-pay

## 2018-07-06 ENCOUNTER — Other Ambulatory Visit (INDEPENDENT_AMBULATORY_CARE_PROVIDER_SITE_OTHER): Payer: BLUE CROSS/BLUE SHIELD

## 2018-07-06 ENCOUNTER — Other Ambulatory Visit: Payer: BLUE CROSS/BLUE SHIELD

## 2018-07-06 DIAGNOSIS — E559 Vitamin D deficiency, unspecified: Secondary | ICD-10-CM | POA: Diagnosis not present

## 2018-07-06 DIAGNOSIS — I10 Essential (primary) hypertension: Secondary | ICD-10-CM

## 2018-07-06 LAB — BASIC METABOLIC PANEL
BUN: 28 mg/dL — ABNORMAL HIGH (ref 6–23)
CO2: 31 mEq/L (ref 19–32)
Calcium: 9.2 mg/dL (ref 8.4–10.5)
Chloride: 104 mEq/L (ref 96–112)
Creatinine, Ser: 0.88 mg/dL (ref 0.40–1.20)
GFR: 61.7 mL/min (ref >60.00)
Glucose, Bld: 104 mg/dL — ABNORMAL HIGH (ref 70–99)
Potassium: 4.8 mEq/L (ref 3.5–5.1)
Sodium: 142 mEq/L (ref 135–145)

## 2018-07-06 LAB — VITAMIN D 25 HYDROXY (VIT D DEFICIENCY, FRACTURES): VITD: 49.3 ng/mL (ref 30.00–100.00)

## 2018-07-11 ENCOUNTER — Other Ambulatory Visit: Payer: Self-pay | Admitting: Family Medicine

## 2018-07-11 DIAGNOSIS — E559 Vitamin D deficiency, unspecified: Secondary | ICD-10-CM

## 2018-07-12 NOTE — Telephone Encounter (Signed)
Left message for patient. Will need to discuss VS' recommendations with her.

## 2018-07-12 NOTE — Telephone Encounter (Signed)
To my knowledge, Alexandria [name] is not on CPAP therapy.  Therefore, doing a CPAP titration wouldn't be possible.  The purpose of the overnight oximetry was to determine whether she needed adjustment to her home oxygen set up, or whether she needed to have further assessment for sleep disordered breathing.  If [name] isn't capable of having test performed correctly, then please see if there is an alternative DME that we can use to get test done.  She needs to have ONO with 2 liters.

## 2018-07-12 NOTE — Telephone Encounter (Signed)
Returned call to patient. She was referring to results of ONO. Made patient aware that she would needs another visit in order for insurance to cover ONO. Visit scheduled for 5/11. Pt does not want to use [employer]. Pt states she has done 2 ONO's with [employer] the month of March without 1 result. She states she and VS discussed how she would perform on her [place] on 2 L as she travel a lot. Nothing further needed.

## 2018-07-12 NOTE — Telephone Encounter (Signed)
Patient is returning phone call.  Patient phone number is [phone].

## 2018-07-17 ENCOUNTER — Ambulatory Visit (INDEPENDENT_AMBULATORY_CARE_PROVIDER_SITE_OTHER): Payer: BLUE CROSS/BLUE SHIELD | Admitting: Pulmonary Disease

## 2018-07-17 ENCOUNTER — Other Ambulatory Visit: Payer: Self-pay

## 2018-07-17 ENCOUNTER — Encounter: Payer: Self-pay | Admitting: Pulmonary Disease

## 2018-07-17 DIAGNOSIS — J449 Chronic obstructive pulmonary disease, unspecified: Secondary | ICD-10-CM

## 2018-07-17 DIAGNOSIS — J9611 Chronic respiratory failure with hypoxia: Secondary | ICD-10-CM | POA: Diagnosis not present

## 2018-07-17 DIAGNOSIS — R918 Other nonspecific abnormal finding of lung field: Secondary | ICD-10-CM | POA: Diagnosis not present

## 2018-07-17 NOTE — Assessment & Plan Note (Signed)
Assessment: Patient is struggling to complete an overnight oximetry test  Plan: Overnight oximetry test to be completed at [facility] Complete over no on 2 L Contact our office immediately after completing your overnight oximetry so we can get results Continue oxygen therapy as prescribed

## 2018-07-17 NOTE — Assessment & Plan Note (Signed)
Assessment: 12/2017 CT showing multiple pulmonary nodules and centrilobular emphysema November/2019 PET scan shows right upper lobe nodules not hypermetabolic  Plan: TXHF/4142 CT chest as planned

## 2018-07-17 NOTE — Progress Notes (Signed)
Virtual Visit via Telephone Note  I connected with@ on 07/17/18 at  1:30 PM EDT by telephone and verified that I am speaking with the correct person using two identifiers.  Location: Patient: Home Provider: Office [facility] - [address], [place], [place], [place] [address]   I discussed the limitations, risks, security and privacy concerns of performing an evaluation and management service by telephone and the availability of in person appointments. I also discussed with the patient that there may be a patient responsible charge related to this service. The patient expressed understanding and agreed to proceed.  Patient consented to consult via telephone: Yes People present and their role in pt care: Pt    History of Present Illness:  83 y.o. female with dyspnea from COPD and OSA  PMH: Diastolic CHF, deconditioning, and IDA Maintenance: Symbicort 160 Pt of: Dr. Halford Chessman   Chief complaint: Needs ONO    83 year old female followed in our office for COPD as well as dyspnea.  Patient is actively working with DME companies to complete an overnight oximetry study on 2 L.  She has completed 2 overnight oximetry test completed with her current DME company [employer].  Unfortunately the patient has not been able to successfully get results from those overnight oximetry test.  She contacted our office and Dr. said suggested trying a different DME company.  They are trying to complete the overnight oximetry test on 2 L.  Patient reports that breathing has been stable since last office visit.  She continues to be maintained on Symbicort 160.  She does occasionally have a dry tickle in the back of her throat that causes her to have a dry cough.  There is no pattern to this cough.  She is planning on having a follow-up CT of her chest in June/2020.   Observations/Objective:  Pulmonary tests:  RAST 10/02/10 >> negative, IgE 8.1 A1AT 04/15/11 >> MM PFT 10/512 >> FEV1 1.29 (61%), FEV1% 57, TLC 6.01  (119%), RV 3.52 (153%), DLCO 67%, no BD PFT 08/15/14 >> FEV1 1.07 (57%), FEV1% 55, TLC 6.49 (128%), DLCO 47%, no BD  Chest Imaging:  CT chest 06/29/16 >> atherosclerosis, RUL nodule 7 mm (was 8 mm), no change 6 mm LUL nodule, no change 4 mm RLL nodule  CT chest 12/23/17 >> 10 mm RUL nodule, 7 mm RUL nodule, 4 mm RLL nodule, 6 mm LUL nodule, centrilobular emphysema PET scan 01/06/18 >> RUL nodule not hypermetabolic  Sleep tests:  PSG 01/27/10 >> AHI 13, SpO2 low 73% ONO with CPAP 08/09/14 >>Test time 9 hr 16 min. Mean SpO2 92.2%, low SpO2 86%. Spent 10 min with SpO2 <88% ONO with CPAP 06/24/16 >>test time 7 hrs 29 min. Average SpO2 90%, low SpO2 79%. Spent 58 min with SpO2 <88%. ONO with RA 11/01/16 >>test time 6 hrs 43 min. Average SpO2 89%, low SpO2 67%. Spent 2 hrs 7 min with SpO2 <88%.    Assessment and Plan:  Chronic respiratory failure with hypoxia [facility]) Assessment: Patient is struggling to complete an overnight oximetry test  Plan: Overnight oximetry test to [facility] at [facility] Complete over no on 2 L Contact our office immediately after completing your overnight oximetry so we can get results Continue oxygen therapy as prescribed  Abnormal findings on diagnostic imaging of lung Assessment: 12/2017 CT showing multiple pulmonary nodules and centrilobular emphysema November/2019 PET scan shows right upper lobe nodules not hypermetabolic  Plan: AYTK/1601 CT chest as planned  COPD  GOLD II Plan: Continue Symbicort 160 Continue rescue inhaler as needed Follow-up with our office in 2 months   Follow Up Instructions:  Return in about 2 months (around 09/16/2018), or if symptoms worsen or fail to improve, for Follow up with Dr. Halford Chessman, Follow up with Wyn Quaker FNP-C.   I discussed the assessment and treatment plan with the patient. The patient was provided an opportunity to ask questions and all were answered. The patient agreed with the plan and  demonstrated an understanding of the instructions.   The patient was advised to call back or seek an in-person evaluation if the symptoms worsen or if the condition fails to improve as anticipated.  I provided 23 minutes of non-face-to-face time during this encounter.   Lauraine Rinne, NP

## 2018-07-17 NOTE — Assessment & Plan Note (Addendum)
Plan: Continue Symbicort 160 Continue rescue inhaler as needed Follow-up with our office in 2 months

## 2018-07-17 NOTE — Patient Instructions (Addendum)
Overnight Oximetry Test ordered  DME: [facility]  Store: [phone] Home Oxygen: 2L   Continue oxygen therapy as prescribed  >>>maintain oxygen saturations greater than 88 percent  >>>if unable to maintain oxygen saturations please contact the office  >>>do not smoke with oxygen  >>>can use nasal saline gel or nasal saline rinses to moisturize nose if oxygen causes dryness  CT of Chest in June / 2020 as planned   Continue Symbicort 160 >>> 2 puffs in the morning right when you wake up, rinse out your mouth after use, 12 hours later 2 puffs, rinse after use >>> Take this daily, no matter what >>> This is not a rescue inhaler   Return in about 2 months (around 09/16/2018), or if symptoms worsen or fail to improve, for Follow up with Dr. Halford Chessman, Follow up with Wyn Quaker FNP-C.   Coronavirus (COVID-19) Are you at risk?  Are you at risk for the Coronavirus (COVID-19)?  To be considered HIGH RISK for Coronavirus (COVID-19), you have to meet the following criteria:  . Traveled to [place], [place], [place], [place] or [place]; or in the [place] to [place], [place], [place], or [place]; and have fever, cough, and shortness of breath within the last 2 weeks of travel OR . Been in close contact with a person diagnosed with COVID-19 within the last 2 weeks and have fever, cough, and shortness of breath . IF YOU DO NOT MEET THESE CRITERIA, YOU ARE CONSIDERED LOW RISK FOR COVID-19.  What to do if you are HIGH RISK for COVID-19?  Marland Kitchen If you are having a medical emergency, call 911. . Seek medical care right away. Before you go to a doctor's office, urgent care or emergency department, call ahead and tell them about your recent travel, contact with someone diagnosed with COVID-19, and your symptoms. You should receive instructions from your physician's office regarding next steps of care.  . When you arrive at healthcare provider, tell the healthcare staff immediately you have  returned from visiting [place], [place], [place], [place] or [place]; or traveled in the [place] to [place], [place], [place], or [place]; in the last two weeks or you have been in close contact with a person diagnosed with COVID-19 in the last 2 weeks.   . Tell the health care staff about your symptoms: fever, cough and shortness of breath. . After you have been seen by a medical provider, you will be either: o Tested for (COVID-19) and discharged home on quarantine except to seek medical care if symptoms worsen, and asked to  - Stay home and avoid contact with others until you get your results (4-5 days)  - Avoid travel on public transportation if possible (such as bus, train, or airplane) or o Sent to the Emergency Department by EMS for evaluation, COVID-19 testing, and possible admission depending on your condition and test results.  What to do if you are LOW RISK for COVID-19?  Reduce your risk of any infection by using the same precautions used for avoiding the common cold or flu:  Marland Kitchen Wash your hands often with soap and warm water for at least 20 seconds.  If soap and water are not readily available, use an alcohol-based hand sanitizer with at least 60% alcohol.  . If coughing or sneezing, cover your mouth and nose by coughing or sneezing into the elbow areas of your shirt or coat, into a tissue or into your sleeve (not  your hands). . Avoid shaking hands with others and consider head nods or verbal greetings only. . Avoid touching your eyes, nose, or mouth with unwashed hands.  . Avoid close contact with people who are sick. . Avoid places or events with large numbers of people in one location, like concerts or sporting events. . Carefully consider travel plans you have or are making. . If you are planning any travel outside or inside the [place], visit the CDC's Travelers' Health webpage for the latest health notices. . If you have some symptoms but not all symptoms, continue to  monitor at home and seek medical attention if your symptoms worsen. . If you are having a medical emergency, call 911.   [place] / e-Visit: [email or web address]         [facility]: [facility]: [phone]                   [facility]: [phone]           It is flu season:   >>> Best ways to protect herself from the flu: Receive the yearly flu vaccine, practice good hand hygiene washing with soap and also using hand sanitizer when available, eat a nutritious meals, get adequate rest, hydrate appropriately   Please contact the office if your symptoms worsen or you have concerns that you are not improving.   Thank you for choosing [facility] for your healthcare, and for allowing [place] to partner with you on your healthcare journey. I am thankful to be able to provide care to you today.   Wyn Quaker FNP-C

## 2018-07-18 DIAGNOSIS — J449 Chronic obstructive pulmonary disease, unspecified: Secondary | ICD-10-CM | POA: Diagnosis not present

## 2018-07-18 DIAGNOSIS — R0902 Hypoxemia: Secondary | ICD-10-CM | POA: Diagnosis not present

## 2018-07-19 ENCOUNTER — Other Ambulatory Visit: Payer: BLUE CROSS/BLUE SHIELD

## 2018-07-26 ENCOUNTER — Other Ambulatory Visit: Payer: Self-pay | Admitting: Family Medicine

## 2018-08-10 ENCOUNTER — Other Ambulatory Visit: Payer: Self-pay | Admitting: Family Medicine

## 2018-08-12 ENCOUNTER — Other Ambulatory Visit: Payer: Self-pay | Admitting: Family Medicine

## 2018-08-14 MED ORDER — FUROSEMIDE 20 MG PO TABS: 20.0000 mg | ORAL_TABLET | Freq: Every day | ORAL | 1 refills | Status: DC | PRN

## 2018-08-14 MED ORDER — POTASSIUM CHLORIDE ER 10 MEQ PO TBCR: 10.0000 meq | EXTENDED_RELEASE_TABLET | Freq: Every day | ORAL | 1 refills | Status: DC | PRN

## 2018-08-14 NOTE — Addendum Note (Signed)
Addended by: Josetta Huddle on: 08/14/2018 04:40 PM   Modules accepted: Orders

## 2018-08-23 ENCOUNTER — Ambulatory Visit (INDEPENDENT_AMBULATORY_CARE_PROVIDER_SITE_OTHER)
Admission: RE | Admit: 2018-08-23 | Discharge: 2018-08-23 | Disposition: A | Discharge status: Home / Self Care | Payer: Medicare Other | Source: Ambulatory Visit | Attending: Pulmonary Disease | Admitting: Pulmonary Disease

## 2018-08-23 ENCOUNTER — Other Ambulatory Visit: Payer: Self-pay

## 2018-08-23 DIAGNOSIS — J449 Chronic obstructive pulmonary disease, unspecified: Secondary | ICD-10-CM | POA: Diagnosis not present

## 2018-08-23 DIAGNOSIS — R911 Solitary pulmonary nodule: Secondary | ICD-10-CM

## 2018-08-27 ENCOUNTER — Other Ambulatory Visit: Payer: Self-pay | Admitting: Family Medicine

## 2018-09-04 ENCOUNTER — Telehealth: Payer: Self-pay | Admitting: Pulmonary Disease

## 2018-09-04 NOTE — Telephone Encounter (Signed)
CT chest 08/23/18 >> atherosclerosis, moderate centrilobular emphysema with diffuse bronchial wall thickening, 2.4 cm posterior apical irregular nodule increased from 2.3 cm, scar medial RLL, scattered nodules up to 4 mm in medial RLL, 6 mm nodule in LLL   Please schedule ROV with me to discuss CT chest findings.

## 2018-09-04 NOTE — Telephone Encounter (Signed)
Called and spoke with pt letting her know the results of the CT and stated to her that VS wants [place] to schedule appt for him to discuss results with her. Pt verbalized understanding. Pt has been scheduled for appt with VS 7/10 at 11:45.nothing further needed.

## 2018-09-15 ENCOUNTER — Encounter: Payer: Self-pay | Admitting: Pulmonary Disease

## 2018-09-15 ENCOUNTER — Ambulatory Visit (INDEPENDENT_AMBULATORY_CARE_PROVIDER_SITE_OTHER): Payer: BC Managed Care – PPO | Admitting: Pulmonary Disease

## 2018-09-15 ENCOUNTER — Other Ambulatory Visit: Payer: Self-pay

## 2018-09-15 ENCOUNTER — Telehealth: Payer: Self-pay | Admitting: Pulmonary Disease

## 2018-09-15 VITALS — BP 126/62 | HR 74 | Temp 98.0°F | Ht 65.0 in | Wt 210.0 lb

## 2018-09-15 DIAGNOSIS — J9611 Chronic respiratory failure with hypoxia: Secondary | ICD-10-CM | POA: Diagnosis not present

## 2018-09-15 DIAGNOSIS — J449 Chronic obstructive pulmonary disease, unspecified: Secondary | ICD-10-CM

## 2018-09-15 DIAGNOSIS — J432 Centrilobular emphysema: Secondary | ICD-10-CM

## 2018-09-15 DIAGNOSIS — R911 Solitary pulmonary nodule: Secondary | ICD-10-CM | POA: Diagnosis not present

## 2018-09-15 DIAGNOSIS — G4733 Obstructive sleep apnea (adult) (pediatric): Secondary | ICD-10-CM

## 2018-09-15 NOTE — Telephone Encounter (Signed)
Routing to Dr. Halford Chessman as an FYI that the results are being refaxed to our office. Results may be faxed in [facility].

## 2018-09-15 NOTE — Patient Instructions (Signed)
Can try mucinex to help loosen phlegm  Will track down your overnight oximetry from May 2020 and call you with results  Will schedule CT chest without contrast for October 2020 and follow up after that

## 2018-09-15 NOTE — Progress Notes (Signed)
[facility]  Chief Complaint  Patient presents with  . Follow-up    using 2L [place], having increased runny nose, but has afternoon stuffiness    Constitutional:  BP 126/62 (BP Location: Left Arm, Cuff Size: Normal)   Pulse 74   Temp 98 F (36.7 C) (Oral)   Ht 5\' 5"  (1.651 m)   Wt 210 lb (95.3 kg)   SpO2 97%   BMI 34.95 kg/m   Past Medical History:  Depression, Hypothyroidism, HTN, Diastolic CHF, HLD, DM, GERD, Peripheral neuropathy  Brief Summary:  Alexandria [name] is a 83 y.o. female with dyspnea from COPD, diastolic CHF, deconditioning, and iron deficiency anemia.  She also has hx of OSA.  She had follow up CT chest in June.  Showed some increase in size of RUL nodular lesion (reviewed by me with patient).    She has some cough with clear sputum.  Sometimes hard to bring up phlegm.  She is gets occasional wheeze.  Not having fever, chest pain, or hemoptysis.  She had overnight oximetry done in May.  Haven't received results.  She has been using flonase, but will sometimes use dymista.  Wears oxygen 24/7.   Physical Exam:   Appearance - wearing oxygen  ENMT - no sinus tenderness, no nasal discharge, no oral exudate  Neck - no masses, trachea midline, no thyromegaly, no elevation in JVP  Respiratory - normal appearance of chest wall, normal respiratory effort w/o accessory muscle use, no dullness on percussion, no wheezing or rales, decreased  CV - s1s2 regular rate and rhythm, no murmurs, no peripheral edema, radial pulses symmetric  GI - soft, non tender  Lymph - no adenopathy noted in neck and axillary areas  MSK - normal gait  Ext - no cyanosis, clubbing, or joint inflammation noted  Skin - no rashes, lesions, or ulcers  Neuro - normal strength, oriented x 3  Psych - normal [name] and affect   Assessment/Plan:   Rt upper lung nodule. - this has some increase in size compared to imaging from 2019 - reviewed  different options with her: 1) ENB now with risk of anesthesia and pneumothorax, 2) follow up CT chest in few months, 3) conservative management - she would like to have f/u CT chest in October 2020 - if lesion continues to grow, then would plan to review with radiation oncology to determine if she would be a candidate for empiric XRT  COPD with emphysema and chronic bronchitis. - continue symbicort and prn albuterol - she can use mucinex prn  Chronic respiratory failure with hypoxia. - continue 2 liters oxygen 24/7 - will track down her ONO on 2 liters from May 2020 and call her with results   Patient Instructions  Can try mucinex to help loosen phlegm  Will track down your overnight oximetry from May 2020 and call you with results  Will schedule CT chest without contrast for October 2020 and follow up after that  A total of  29 minutes were spent face to face with the patient and more than half of that time involved counseling or coordination of care.   Chesley Mires, MD [facility] Pager: [phone] 09/15/2018, 12:30 PM  Flow Sheet     Pulmonary tests:  RAST 10/02/10 >> negative, IgE 8.1 A1AT 04/15/11 >> MM PFT 10/512 >> FEV1 1.29 (61%), FEV1% 57, TLC 6.01 (119%), RV 3.52 (153%), DLCO 67%, no BD PFT 08/15/14 >> FEV1 1.07 (57%), FEV1% 55,  TLC 6.49 (128%), DLCO 47%, no BD  Chest Imaging:  CT chest 06/29/16 >> atherosclerosis, RUL nodule 7 mm (was 8 mm), no change 6 mm LUL nodule, no change 4 mm RLL nodule  CT chest 12/23/17 >> 10 mm RUL nodule, 7 mm RUL nodule, 4 mm RLL nodule, 6 mm LUL nodule, centrilobular emphysema PET scan 01/06/18 >> RUL nodule not hypermetabolic CT chest 2/59/56 >> atherosclerosis, moderate centrilobular emphysema with diffuse bronchial wall thickening, 2.4 cm posterior apical irregular nodule increased from 2.3 cm, scar medial RLL, scattered nodules up to 4 mm in medial RLL, 6 mm nodule in LLL  Sleep tests:  PSG 01/27/10 >> AHI 13,  SpO2 low 73% ONO with CPAP 08/09/14 >> Test time 9 hr 16 min. Mean SpO2 92.2%, low SpO2 86%. Spent 10 min with SpO2 < 88% ONO with CPAP 06/24/16 >>test time 7 hrs 29 min. Average SpO2 90%, low SpO2 79%. Spent 58 min with SpO2 <88%. ONO with RA 11/01/16 >> test time 6 hrs 43 min.  Average SpO2 89%, low SpO2 67%.  Spent 2 hrs 7 min with SpO2 < 88%.  Cardiac tests:  Echo 09/28/14 >> mild LVH, EF 55 to 60%  Medications:   Allergies as of 09/15/2018      Reactions   Antihistamines, Diphenhydramine-type Other (See Comments)   Able to tolerate only allegra.    Sulfonamide Derivatives Swelling   REACTION: closed throat   Incruse Ellipta [umeclidinium Bromide] Cough   Caused voice change and severe coughing   Clarithromycin Other (See Comments)   REACTION: diff swallowing and mouth blisters   Codeine Other (See Comments)   Unknown    Lyrica [pregabalin] Other (See Comments)   Lack of effect for neuropathy pain.     Spiriva Handihaler [tiotropium Bromide Monohydrate] Other (See Comments)   Voice changes      Medication List       Accurate as of September 15, 2018 12:30 PM. If you have any questions, ask your nurse or doctor.        STOP taking these medications   FLONASE SENSIMIST NA Stopped by: Chesley Mires, MD     TAKE these medications   albuterol 108 (90 Base) MCG/ACT inhaler Commonly known as: ProAir HFA Inhale 2 puffs into the lungs every 6 (six) hours as needed for wheezing or shortness of breath. INHALE 2 PUFFS INTO THE LUNGS EVERY 6 HOURS AS NEEDED FOR WHEEZING OR SHORTNESS OF BREATH   Azelastine-Fluticasone 137-50 MCG/ACT Susp Place 2 puffs into the nose 1 day or 1 dose for 1 dose.   budesonide-formoterol 160-4.5 MCG/ACT inhaler Commonly known as: Symbicort Inhale 2 puffs into the lungs 2 (two) times daily.   busPIRone 5 MG tablet Commonly known as: BUSPAR As needed   Coenzyme Q-10 200 MG Caps Take 200 mg by mouth daily.   EpiPen 0.3 mg/0.3 mL Devi Generic drug:  EPINEPHrine Inject 0.3 mg into the muscle once as needed (anaphylaxis).   esomeprazole 40 MG capsule Commonly known as: NEXIUM Take 40 mg by mouth every other day.   ezetimibe 10 MG tablet Commonly known as: ZETIA TAKE 1 TABLET BY MOUTH EVERYDAY AT BEDTIME   ferrous sulfate 325 (65 FE) MG EC tablet Take 1 tablet (325 mg total) by mouth daily with breakfast.   fexofenadine 180 MG tablet Commonly known as: ALLEGRA Take 180 mg by mouth as needed.   FLUoxetine 20 MG capsule Commonly known as: PROZAC Take 20 mg by mouth 2 (two) times  daily. What changed: Another medication with the same name was removed. Continue taking this medication, and follow the directions you see here. Changed by: Chesley Mires, MD   fluticasone 50 MCG/ACT nasal spray Commonly known as: FLONASE USE 2 SPRAYS INTO THE NOSE EVERY 12 HOURS AS NEEDED FOR STUFFY NOSE   furosemide 20 MG tablet Commonly known as: LASIX Take 1 tablet (20 mg total) by mouth daily as needed for fluid.   glucose blood test strip Commonly known as: Civil engineer, contracting Use as instructed to check sugar 2-3x time daily   OneTouch Verio test strip Generic drug: glucose blood USE AS INSTRUCTED TO CHECK SUGAR 1 TIME DAILY   ibuprofen 200 MG tablet Commonly known as: ADVIL Take 200-400 mg by mouth every 6 (six) hours as needed for moderate pain.   metFORMIN 500 MG tablet Commonly known as: GLUCOPHAGE TAKE 1 TABLET (500 MG TOTAL) BY MOUTH DAILY WITH SUPPER.   nitroGLYCERIN 0.4 MG SL tablet Commonly known as: NITROSTAT Place 1 tablet (0.4 mg total) under the tongue every 5 (five) minutes as needed for chest pain.   OneTouch Delica Lancets Fine Misc USE TO CHECK SUGAR 1 TIME DAILY   potassium chloride 10 MEQ tablet Commonly known as: K-DUR Take 1 tablet (10 mEq total) by mouth daily as needed (Takes with furosemide doses.).   rosuvastatin 10 MG tablet Commonly known as: CRESTOR TAKE 1 TABLET (10 MG TOTAL) BY MOUTH EVERY OTHER DAY.    Spacer/Aero Chamber [employer] Use with  inhaler as needed.  J44.9   Synthroid 125 MCG tablet Generic drug: levothyroxine TAKE 1 TABLET (125 MCG TOTAL) BY MOUTH DAILY BEFORE BREAKFAST.   valsartan 160 MG tablet Commonly known as: DIOVAN Take 0.5 tablets (80 mg total) by mouth 2 (two) times daily.   vitamin B-12 50 MCG tablet Commonly known as: CYANOCOBALAMIN Take 50 mcg by mouth daily.   Vitamin D (Ergocalciferol) 1.25 MG (50000 UT) Caps capsule Commonly known as: DRISDOL Take 1 capsule (50,000 Units total) by mouth every 7 (seven) days.   Vitamin D 50 MCG (2000 UT) tablet Take 1 tablet (2,000 Units total) by mouth daily.       Past Surgical History:  She  has a past surgical history that includes Tonsillectomy; Rotator cuff repair (1984); Cesarean section; Total abdominal hysterectomy (1985); Cholecystectomy (1997); THUMB RELEASE (12/1997); Carpal tunnel release (12/1997); Esophagogastroduodenoscopy (12/05/1997); ABD U/S (03/19/1999); CT ABD W & PELVIS WO/W CM; CARDIOLITE PERSANTINE (08/24/2000); CAROTID U/S (08/24/2000); DEXA (07/03/2003); ADENOSINE MYOVIEW (06/02/2007); CAROTID U/S (06/02/2007); US ECHOCARDIOGRAPHY (06/02/2007); Hernia repair (01/24/2009); knee arthroscopic surgery (years ago); Shoulder open rotator cuff repair (02/08/2012); Gallbladder surgery; Dental surgery (2016); Colonoscopy with propofol (N/A, 02/12/2016); and Esophagogastroduodenoscopy (egd) with propofol (N/A, 02/12/2016).  Family History:  Her family history includes Cystic fibrosis in her sister; Emphysema in her father; Heart disease in her mother; Hyperthyroidism in her brother and sister; Osteoarthritis in her brother; Thyroid disease in her mother.  Social History:  She  reports that she quit smoking about 13 years ago. Her smoking use included cigarettes. She has a 100.00 pack-year smoking history. She has never used smokeless tobacco. She reports current alcohol use. She reports that she does not  use drugs.

## 2018-09-19 NOTE — Telephone Encounter (Signed)
Checked with Lattie Haw T to see if results have been received and she said that she did receive results from VS's file up front yesterday, 7/13. VS will be back at office Thursday 7/16 and the results have been placed in his folder for him to review.

## 2018-09-19 NOTE — Telephone Encounter (Signed)
Aaron Edelman, please advise if you have received ONO results on pt from [facility]. Thanks!

## 2018-09-19 NOTE — Telephone Encounter (Signed)
I have not received any sort of overnight oximetry results.  If I have I typically document that in a telephone note.  Wyn Quaker, FNP

## 2018-09-21 NOTE — Telephone Encounter (Signed)
Pt is returning call. Cb is [phone].

## 2018-09-21 NOTE — Telephone Encounter (Signed)
ATC patient went to voicemail, left message to call back

## 2018-09-21 NOTE — Telephone Encounter (Signed)
Finally received ONO.  Tried calling patient, but no answer.  ONO with 2 liters 07/18/18 >> test time 8 hrs 34 min.  Baseline SpO2 94%, low SpO2 69%.  Spent 38 min with SpO2 < 88%.  Looks like REM related desaturation pattern from history of OSA.   Please let her know her oxygen level was intermittently low at night.  This is most likely related to her history of sleep apnea.  Options are 1) repeat sleep study and consider restarting CPAP therapy, or 2) increase oxygen at night to 3 liters.

## 2018-09-21 NOTE — Telephone Encounter (Signed)
Called and spoke with patient regarding ONO results. Patient wants to chang to 3L at night with her O2. She is also looking to change DME companies. Currently using [employer] and wants to change.  I instructed patient if she is still having trouble after changing to 3L to call our office.   Order sent in to [employer] at this time due to patient not sure where she wants to change to. Patient was okay with this.   Nothing further needed at this time.

## 2018-09-22 ENCOUNTER — Telehealth: Payer: Self-pay | Admitting: Pulmonary Disease

## 2018-09-22 NOTE — Telephone Encounter (Signed)
Catron, Valentina Lucks, Allyne Gee, [employer]; Hoover, Jeanie Cooks, Jennifer L        Are we discontinuing her daytime o2 and she will only be on 3lpm at night, or is she staying on daytime and just changing her night time liter flow to 3? i called and was on hold someone answered - i introduced myself and the call disconnected. Please advise on the above

## 2018-09-22 NOTE — Telephone Encounter (Signed)
Pt will be continuing to use 2L O2 during the day, just changing to 3L at night.  Called [employer] and spoke with Levada Dy letting her know that pt will still be on 2L O2 during the day just changing to 3L at night. Levada Dy verbalized understanding. Nothing further needed.

## 2018-10-17 NOTE — Progress Notes (Signed)
Reviewed and agree with assessment/plan.   Cerra Eisenhower, MD [facility] 03/03/2016, 12:24 PM Pager:  [phone]  

## 2018-11-01 ENCOUNTER — Other Ambulatory Visit: Payer: Self-pay

## 2018-11-01 ENCOUNTER — Encounter: Payer: Self-pay | Admitting: Internal Medicine

## 2018-11-01 ENCOUNTER — Ambulatory Visit (INDEPENDENT_AMBULATORY_CARE_PROVIDER_SITE_OTHER): Payer: Medicare Other | Admitting: Internal Medicine

## 2018-11-01 ENCOUNTER — Telehealth: Payer: Self-pay | Admitting: Internal Medicine

## 2018-11-01 VITALS — BP 138/60 | HR 77 | Ht 65.0 in | Wt 212.0 lb

## 2018-11-01 DIAGNOSIS — E039 Hypothyroidism, unspecified: Secondary | ICD-10-CM | POA: Diagnosis not present

## 2018-11-01 DIAGNOSIS — E1140 Type 2 diabetes mellitus with diabetic neuropathy, unspecified: Secondary | ICD-10-CM | POA: Diagnosis not present

## 2018-11-01 DIAGNOSIS — E785 Hyperlipidemia, unspecified: Secondary | ICD-10-CM | POA: Diagnosis not present

## 2018-11-01 DIAGNOSIS — E66813 Obesity, class 3: Secondary | ICD-10-CM

## 2018-11-01 LAB — POCT GLYCOSYLATED HEMOGLOBIN (HGB A1C): Hemoglobin A1C: 5.2 % (ref 4.0–5.6)

## 2018-11-01 LAB — TSH: TSH: 41.98 u[IU]/mL — ABNORMAL HIGH (ref 0.35–4.50)

## 2018-11-01 LAB — T4, FREE: Free T4: 0.69 ng/dL (ref 0.60–1.60)

## 2018-11-01 NOTE — Patient Instructions (Signed)
Please continue Synthroid 125 mcg daily.  Take the thyroid hormone every day, with water, at least 30 minutes before breakfast, separated by at least 4 hours from: - acid reflux medications - calcium - iron - multivitamins  Please continue: - Metformin 500 mg with supper  Please stop at the lab.  Please return in 6 months with your sugar log.

## 2018-11-01 NOTE — Progress Notes (Signed)
Patient ID: Alexandria [name], female   DOB: [birth year], 83 y.o.   MRN: [id number]  HPI: Alexandria [name] is a 83 y.o.-year-old female, returning for follow-up for DM2, dx in 2006, non-insulin-dependent, controlled, with complications (mild CKD) and hypothyroidism. Last visit 10 months ago.  DM2: Last hemoglobin A1c was: Lab Results  Component Value Date   HGBA1C 5.5 06/20/2018   HGBA1C 5.4 12/22/2017   HGBA1C 5.7 06/23/2017   Pt is on: - Metformin 500 mg with dinner She was on Januvia >> stopped in summer 2016, when she was admitted for sepsis. We did not restart afterwards as her sugars remained controlled.  Pt was checking sugars 2-3 x a day - now had pbs with her meter - when she checked last: 120-140. Prev.: - am: 96-125, 143, 172 >> 109-135, 146 >> 118, 124 - 2h after brunch:  241 >> 124-195 >> n/c >> 175 - before dinner:n/c >> 132-155 >> n/c >> 114-152 - 2h after dinner: 135-188, 336 (fruit cake) >> 138 >> 138 - bedtime:  163-193, 226 >> 124, 147 >> n/c >> 133 - nighttime: n/c >> 169, 175 >> n/c Lowest sugar was 96 >> 71 >>114 >> ?; she has hypoglycemia awareness in the 60s. Highest sugar was 336 >> 186 (starch) >> 175 (pie) >> ?.  Glucometer:One Touch Ultra mini  Pt's meals are: - changed to [employer]: - Breakfast: protein drink, egg, cereals - Lunch: sandwich - Dinner: meat + 2 veggies - Snacks: 1-3 peanut butter, milk, crackers  -+ Mild CKD, last BUN/creatinine:  Lab Results  Component Value Date   BUN 28 (H) 07/06/2018   CREATININE 0.88 07/06/2018  On valsartan. -+ HL; last set of lipids: Lab Results  Component Value Date   CHOL 123 06/20/2018   HDL 49.50 06/20/2018   LDLCALC 53 06/20/2018   LDLDIRECT 63.0 07/29/2015   TRIG 102.0 06/20/2018   CHOLHDL 2 06/20/2018  On Crestor every other day. - last eye exam was: 02/2018: No DR, + cataract. - no numbness and tingling in her feet.  Hypothyroidism.  Pt is on Synthroid 125 Mcg daily, taken: - in  am - fasting - at least 30 min from b'fast - + Ca, Fe, MVI, PPIs - all >4h after Synthroid - not on Biotin  Latest TSH level was normal: Lab Results  Component Value Date   TSH 3.83 12/22/2017   She has a h/o COPD- Dr. Halford Chessman, HTN, anemia, GERD. She fell in 09/2015 >> hurt R leg (had many stitches) and R orbit. She was in rehab afterwards.  ROS: Constitutional: no weight gain/+ weight loss, no fatigue, no subjective hyperthermia, no subjective hypothermia Eyes: no blurry vision, no xerophthalmia ENT: no sore throat, no nodules palpated in neck, no dysphagia, no odynophagia, no hoarseness Cardiovascular: no CP/no SOB/no palpitations/no leg swelling Respiratory: no cough/no SOB/no wheezing Gastrointestinal: no N/no V/no D/no C/no acid reflux Musculoskeletal: no muscle aches/no joint aches Skin: no rashes, no hair loss Neurological: no tremors/no numbness/no tingling/no dizziness, + lightheadedness.  I reviewed pt's medications, allergies, PMH, social hx, family hx, and changes were documented in the history of present illness. Otherwise, unchanged from my initial visit note.  Past Medical History:  Diagnosis Date  . Allergy, unspecified not elsewhere classified   . Cataract   . Chronic diastolic congestive heart failure ([place])   . COPD (chronic obstructive pulmonary disease) ([place]) 03/08/1998   PFTs 12/12/2002 FEV 1 1.42 (64%) ratio 58 with no better after B2 and  DLCO75%; PFTs 11/20/09 FEV1 1.50 (73%) ratio 50 no better after B2 with DLCO 62%; Hfa 50% 11/20/2009 >75%, 01/13/10 p coaching  . Depression 12/07/1998  . Diabetes mellitus type II 02/05/2006   Dr. Cruzita Lederer with endo  . Disorders of bursae and tendons in shoulder region, unspecified    Rotator cuff syndrome, right  . E. coli bacteremia   . Esophagitis   . GERD (gastroesophageal reflux disease)   . History of UTI   . Hyperlipidemia 10/04/2000  . Hypertension 03/08/1992  . Hypothyroidism 03/08/1968  . Iron deficiency anemia    . Obesity    NOS  . OSA (obstructive sleep apnea)    PSG 01/27/10 AHI 13, pt does not know CPAP settings  . Peripheral neuropathy    Likely due to DM per Dr. Erling Cruz  . Ventral hernia    Past Surgical History:  Procedure Laterality Date  . ABD U/S  03/19/1999   Nml x2 foci in liver  . ADENOSINE MYOVIEW  06/02/2007   Nml  . CARDIOLITE PERSANTINE  08/24/2000   Nml  . CAROTID U/S  08/24/2000   1-39% ICA stenosis  . CAROTID U/S  06/02/2007   No apprec change   . CARPAL TUNNEL RELEASE  12/1997   Right  . CESAREAN SECTION     x2 Breech/ repeat  . CHOLECYSTECTOMY  1997  . COLONOSCOPY WITH PROPOFOL N/A 02/12/2016   Procedure: COLONOSCOPY WITH PROPOFOL;  Surgeon: Milus Banister, MD;  Location: [facility];  Service: Endoscopy;  Laterality: N/A;  . CT ABD W & PELVIS WO/W CM     Abd hemangiomas of liver, 1 cm R renal cyst  . DENTAL SURGERY  2016   Implants  . DEXA  07/03/2003   Nml  . ESOPHAGOGASTRODUODENOSCOPY  12/05/1997   Nml (due to hoarseness)  . ESOPHAGOGASTRODUODENOSCOPY (EGD) WITH PROPOFOL N/A 02/12/2016   Procedure: ESOPHAGOGASTRODUODENOSCOPY (EGD) WITH PROPOFOL;  Surgeon: Milus Banister, MD;  Location: [facility];  Service: Endoscopy;  Laterality: N/A;  . GALLBLADDER SURGERY    . HERNIA REPAIR  01/24/2009   Lap Ventr w/ Lysis of adhesions (Dr. Donne Hazel)  . knee arthroscopic surgery  years ago   right  . ROTATOR CUFF REPAIR  1984   Right, [place]  . SHOULDER OPEN ROTATOR CUFF REPAIR  02/08/2012   Procedure: ROTATOR CUFF REPAIR SHOULDER OPEN;  Surgeon: Magnus Sinning, MD;  Location: [facility];  Service: Orthopedics;  Laterality: Left;  Left Shoulder Open Anterior Acrominectomy Rotator Cuff Repair Open Distal Clavicle Resection ,tissue mend graft, and repair of biceps tendon  . THUMB RELEASE  12/1997   Right  . TONSILLECTOMY    . TOTAL ABDOMINAL HYSTERECTOMY  1985   Due to dysmennorhea  . US ECHOCARDIOGRAPHY  06/02/2007   Social History   Occupational History   . Retired - self employed- Engineer, structural    Social History Main Topics  . Smoking status: Former Smoker -- 2.00 packs/day for 50 years    Types: Cigarettes    Quit date: 2007  . Smokeless tobacco: Never Used  . Alcohol Use: 1.2 oz/week    2 Standard drinks or equivalent per week     Comment: occasional  . Drug Use: No  . Sexual Activity: Not on file   Social History Narrative   Married with 2 children   Enjoys painting   Current Outpatient Medications on File Prior to Visit  Medication Sig Dispense Refill  . albuterol (PROAIR HFA) 108 (90 Base) MCG/ACT  inhaler Inhale 2 puffs into the lungs every 6 (six) hours as needed for wheezing or shortness of breath. INHALE 2 PUFFS INTO THE LUNGS EVERY 6 HOURS AS NEEDED FOR WHEEZING OR SHORTNESS OF BREATH 3 Inhaler 3  . Azelastine-Fluticasone 137-50 MCG/ACT SUSP Place 2 puffs into the nose 1 day or 1 dose for 1 dose. 1 Bottle 3  . budesonide-formoterol (SYMBICORT) 160-4.5 MCG/ACT inhaler Inhale 2 puffs into the lungs 2 (two) times daily. 3 Inhaler 3  . busPIRone (BUSPAR) 5 MG tablet As needed    . Cholecalciferol (VITAMIN D) 50 MCG (2000 UT) tablet Take 1 tablet (2,000 Units total) by mouth daily.    . Coenzyme Q-10 200 MG CAPS Take 200 mg by mouth daily.     Marland Kitchen EPINEPHrine (EPIPEN) 0.3 mg/0.3 mL DEVI Inject 0.3 mg into the muscle once as needed (anaphylaxis).     Marland Kitchen esomeprazole (NEXIUM) 40 MG capsule Take 40 mg by mouth every other day.     . ezetimibe (ZETIA) 10 MG tablet TAKE 1 TABLET BY MOUTH EVERYDAY AT BEDTIME 90 tablet 1  . ferrous sulfate 325 (65 FE) MG EC tablet Take 1 tablet (325 mg total) by mouth daily with breakfast.    . fexofenadine (ALLEGRA) 180 MG tablet Take 180 mg by mouth as needed.     Marland Kitchen FLUoxetine (PROZAC) 20 MG capsule Take 20 mg by mouth 2 (two) times daily.    . fluticasone (FLONASE) 50 MCG/ACT nasal spray USE 2 SPRAYS INTO THE NOSE EVERY 12 HOURS AS NEEDED FOR STUFFY NOSE 48 g 2  . furosemide (LASIX) 20 MG  tablet Take 1 tablet (20 mg total) by mouth daily as needed for fluid. 90 tablet 1  . glucose blood (ONETOUCH VERIO) test strip Use as instructed to check sugar 2-3x time daily 200 each 5  . ibuprofen (ADVIL,MOTRIN) 200 MG tablet Take 200-400 mg by mouth every 6 (six) hours as needed for moderate pain.     . metFORMIN (GLUCOPHAGE) 500 MG tablet TAKE 1 TABLET (500 MG TOTAL) BY MOUTH DAILY WITH SUPPER. 90 tablet 3  . nitroGLYCERIN (NITROSTAT) 0.4 MG SL tablet Place 1 tablet (0.4 mg total) under the tongue every 5 (five) minutes as needed for chest pain. 25 tablet 3  . ONETOUCH DELICA LANCETS FINE MISC USE TO CHECK SUGAR 1 TIME DAILY 100 each 5  . ONETOUCH VERIO test strip USE AS INSTRUCTED TO CHECK SUGAR 1 TIME DAILY 100 each 8  . potassium chloride (K-DUR) 10 MEQ tablet Take 1 tablet (10 mEq total) by mouth daily as needed (Takes with furosemide doses.). 90 tablet 1  . rosuvastatin (CRESTOR) 10 MG tablet TAKE 1 TABLET (10 MG TOTAL) BY MOUTH EVERY OTHER DAY. 45 tablet 3  . Spacer/Aero Chamber [employer] Use with  inhaler as needed.  J44.9 1 each 0  . SYNTHROID 125 MCG tablet TAKE 1 TABLET (125 MCG TOTAL) BY MOUTH DAILY BEFORE BREAKFAST. 90 tablet 2  . valsartan (DIOVAN) 160 MG tablet Take 0.5 tablets (80 mg total) by mouth 2 (two) times daily.    . vitamin B-12 (CYANOCOBALAMIN) 50 MCG tablet Take 50 mcg by mouth daily.     . Vitamin D, Ergocalciferol, (DRISDOL) 1.25 MG (50000 UT) CAPS capsule Take 1 capsule (50,000 Units total) by mouth every 7 (seven) days. 12 capsule 0   No current facility-administered medications on file prior to visit.    Allergies  Allergen Reactions  . Antihistamines, Diphenhydramine-Type Other (See Comments)  Able to tolerate only allegra.   . Sulfonamide Derivatives Swelling    REACTION: closed throat  . Incruse Ellipta [Umeclidinium Bromide] Cough    Caused voice change and severe coughing  . Clarithromycin Other (See Comments)    REACTION: diff swallowing  and mouth blisters  . Codeine Other (See Comments)    Unknown   . Lyrica [Pregabalin] Other (See Comments)    Lack of effect for neuropathy pain.    Marland Kitchen Spiriva Handihaler [Tiotropium Bromide Monohydrate] Other (See Comments)    Voice changes   Family History  Problem Relation Age of Onset  . Heart disease Mother   . Thyroid disease Mother   . Emphysema Father        One lung  . Cystic fibrosis Sister   . Hyperthyroidism Sister   . Osteoarthritis Brother   . Hyperthyroidism Brother   . Esophageal cancer Neg Hx   . Cancer Neg Hx        Head or neck  . Colon cancer Neg Hx   . Stomach cancer Neg Hx   . Breast cancer Neg Hx    PE: BP 138/60   Pulse 77   Ht 5\' 5"  (1.651 m)   Wt 212 lb (96.2 kg)   SpO2 97% Comment: on O2  BMI 35.28 kg/m  Body mass index is 35.28 kg/m.  Wt Readings from Last 3 Encounters:  11/01/18 212 lb (96.2 kg)  09/15/18 210 lb (95.3 kg)  06/27/18 216 lb 1 oz (98 kg)   Constitutional: overweight, in NAD, on oxygen, walks with walker Eyes: PERRLA, EOMI, no exophthalmos ENT: moist mucous membranes, no thyromegaly, no cervical lymphadenopathy Cardiovascular: RRR, No RG, +1/6 SEM, lower extremity edema right leg-chronic, after surgery on right knee Respiratory: CTA B Gastrointestinal: abdomen soft, NT, ND, BS+ Musculoskeletal: no deformities, strength intact in all 4 Skin: moist, warm,  + rash on right chin-chronic Neurological: + tremor with outstretched hands, DTR normal in all 4  ASSESSMENT: 1. DM2, non-insulin-dependent, controlled, with complications - mild CKD  2. Hypothyroidism  3. Obesity class 3  4. HL  PLAN:  1. Patient with longstanding, uncontrolled, type 2 diabetes, on low-dose metformin only, with latest HbA1c excellent, at 5.5% 4 months ago.  I am usually following her on a six-month basis.  However, last visit was 10 months ago. -She was previously on Januvia, stopped during the hospitalization for sepsis in 2016.  She tolerated  this well in the past so if she needs intensification of treatment, we may use this in the future. -At last visit, sugars were excellent with the exception of occasional CBGs that were higher than target after dietary indiscretions.  At last visit, her sister accompanied her to the appointment and she was asking about the possibility of starting patient on a medication that can also help with weight loss (SGLT2 inhibitor or GLP-1 receptor agonist).  However, since she was requiring only a low dose of metformin with excellent blood sugars, we did not change her regimen -At this visit, she did not check sugars frequently but whenever she checks her sugars appear to be at goal.  HbA1c today: 5.2% (better) -No need to change her regimen for now. - I advised her to: Patient Instructions  Please continue Synthroid 125 mcg daily.  Take the thyroid hormone every day, with water, at least 30 minutes before breakfast, separated by at least 4 hours from: - acid reflux medications - calcium - iron - multivitamins  Please continue: -  Metformin 500 mg with supper  Please stop at the lab.  Please return in 6 months with your sugar log.   - advised to check sugars at different times of the day - 1x a day, rotating check times - advised for yearly eye exams >> she is UTD - Return to clinic in 6 months with your sugar log  2. Hypothyroidism - latest thyroid labs reviewed with pt >> normal 12/2017 - she continues on LT4 125 mcg daily - pt feels good on this dose. - we discussed about taking the thyroid hormone every day, with water, >30 minutes before breakfast, separated by >4 hours from acid reflux medications, calcium, iron, multivitamins. Pt. is taking it correctly. - will check thyroid tests today: TSH and fT4 - If labs are abnormal, she will need to return for repeat TFTs in 1.5 months  3. Obesity class 3 -She lost ~8 pounds in the last 6 months! -She is now doing [employer], which she  likes.  4. HL - Reviewed latest lipid panel from 4 months ago: At goal Lab Results  Component Value Date   CHOL 123 06/20/2018   HDL 49.50 06/20/2018   LDLCALC 53 06/20/2018   LDLDIRECT 63.0 07/29/2015   TRIG 102.0 06/20/2018   CHOLHDL 2 06/20/2018  - Continues the Crestor every other day and Zetia without side effects.  Office Visit on 11/01/2018  Component Date Value Ref Range Status  . TSH 11/01/2018 41.98* 0.35 - 4.50 uIU/mL Final  . Free T4 11/01/2018 0.69  0.60 - 1.60 ng/dL Final   Comment: Specimens from patients who are undergoing biotin therapy and /or ingesting biotin supplements may contain high levels of biotin.  The higher biotin concentration in these specimens interferes with this Free T4 assay.  Specimens that contain high levels  of biotin may cause false high results for this Free T4 assay.  Please interpret results in light of the total clinical presentation of the patient.    . Hemoglobin A1C 11/01/2018 5.2  4.0 - 5.6 % Final   TSH is now extremely high (???). I will check with her if she is taking the levothyroxine at all.  Philemon Kingdom, MD PhD [facility]

## 2018-11-01 NOTE — Telephone Encounter (Signed)
Patient is returning a call to our office. Unsure of who it was.  Please Advise, Thanks

## 2018-11-02 NOTE — Telephone Encounter (Signed)
No notes in chart about her being called.

## 2018-11-16 ENCOUNTER — Telehealth: Payer: Self-pay | Admitting: Pulmonary Disease

## 2018-11-16 NOTE — Telephone Encounter (Signed)
  ATC pt, no answer. Left message for pt to call back.   Rodena Piety do you have this letter from her DME? Please advise.

## 2018-11-20 NOTE — Telephone Encounter (Signed)
No I don't have anything on this patient

## 2018-11-20 NOTE — Telephone Encounter (Signed)
Rodena Piety please advise if you have this document. Thanks.

## 2018-11-22 NOTE — Telephone Encounter (Signed)
Attempted to call patient, no answer, left message to call back.  Called and spoke to [employer].  I was told they didn't see anything noted on her chart that is needed from our end. When patient calls back we will need to get the name and number of who reached out to her so we can continue to follow up.

## 2018-11-23 NOTE — Telephone Encounter (Signed)
Spoke with patient. She stated that is was [employer] that called her. They are billing her directly instead of billing her insurance because they did not receive a CMN from our office.   Will send a message to Levada Dy to check on the status of this.

## 2018-11-23 NOTE — Telephone Encounter (Signed)
Patient is returning the call. CB is [phone]

## 2018-11-24 NOTE — Telephone Encounter (Signed)
Received the following message from [place]:   " [place], Neysa Hotter, [place]; Julian Hy Tyrone Schimke        I have contacted the billing department to call her to advise on any kind of billing issue. I have also asked them to let myself or Melissa know if there is something your office needs to do.   Thank you   Levada Dy    Left message to see if patient had heard anything different from [employer].

## 2018-11-26 ENCOUNTER — Other Ambulatory Visit: Payer: Self-pay | Admitting: Family Medicine

## 2018-11-28 NOTE — Telephone Encounter (Addendum)
Spoke with pt, she stated they have re-billed Medicare and not BCBS for her oxygen. Nothing further is needed.

## 2018-12-14 ENCOUNTER — Other Ambulatory Visit: Payer: Self-pay

## 2018-12-14 ENCOUNTER — Ambulatory Visit (INDEPENDENT_AMBULATORY_CARE_PROVIDER_SITE_OTHER)
Admission: RE | Admit: 2018-12-14 | Discharge: 2018-12-14 | Disposition: A | Discharge status: Home / Self Care | Payer: Medicare Other | Source: Ambulatory Visit | Attending: Pulmonary Disease | Admitting: Pulmonary Disease

## 2018-12-14 DIAGNOSIS — R911 Solitary pulmonary nodule: Secondary | ICD-10-CM | POA: Diagnosis not present

## 2018-12-14 DIAGNOSIS — R918 Other nonspecific abnormal finding of lung field: Secondary | ICD-10-CM | POA: Diagnosis not present

## 2018-12-23 ENCOUNTER — Other Ambulatory Visit: Payer: Self-pay | Admitting: Family Medicine

## 2018-12-25 ENCOUNTER — Telehealth: Payer: Self-pay | Admitting: Pulmonary Disease

## 2018-12-25 NOTE — Telephone Encounter (Signed)
Okay to change to tele visit.  Video visit preferable if this is an option.

## 2018-12-25 NOTE — Telephone Encounter (Signed)
Spoke with the pt  She states that she would like to change her upcoming appt to a televisit, but only if her CT Chest is unchanged from the prior scan  Please advise on CT so she will know what kind of visit she wants   Also, I have sent msg to [facility] regarding her o2, b/c she said she received a letter from [employer] that they are needing some sort of documentation from our office  Will await response

## 2018-12-25 NOTE — Telephone Encounter (Signed)
Looks like it was discussed for pt to cut pills in half back in April 2020... please advise if okay to refill and does pt need to follow up?

## 2018-12-26 ENCOUNTER — Telehealth: Payer: Self-pay | Admitting: Pulmonary Disease

## 2018-12-26 NOTE — Telephone Encounter (Signed)
ATC Patient.  LM to call back. Patient has upcoming in office visit with VS 12/28/18.

## 2018-12-26 NOTE — Telephone Encounter (Signed)
Patient returned call to office.  Patient was told by [place] that she needs a 6  Minute walk for O2 or Medicare will not cover her O2 anymore. Explained to Patient Medicare requires Patients with O2 to be re qualified for O2 yearly.   Patient has OV with VS 12/28/18 for follow up.  Explained she can be qualified for her O2 at [place]. Understanding stated.  Nothing further needed.

## 2018-12-26 NOTE — Telephone Encounter (Signed)
Please verify current dose and blood pressure with patient.  Let me know.  Thanks. If doing well otherwise, then reasonable for yearly follow-up in the spring.

## 2018-12-26 NOTE — Telephone Encounter (Signed)
Patient called to find out about the refill on her medication.  Please call patient at [phone].

## 2018-12-26 NOTE — Telephone Encounter (Signed)
Called the patient to advise of Dr. Juanetta Gosling response. Patient stated she saw the result of the report and that the nodules were fine. I advised her that Dr. Halford Chessman would still want to discuss the result information with her.  Patient stated her daugther Lavella Lemons [phone]) wanted to be part of the call and asked if a 3 way call can be done. I advised her our phone system is not set up for 3 way calls. But if she wanted Dr. Halford Chessman to contact her daughter and for her to then try to reach her to include in call, that could be done. Patient agreed. Appointment changed to televisit. Nothing further needed at this time.

## 2018-12-26 NOTE — Telephone Encounter (Signed)
Patient says she is taking one half tablet (80 mg total) 2 times daily.  BP's have been in good range.  Sent Rx.

## 2018-12-27 NOTE — Telephone Encounter (Signed)
Noted. Thanks.

## 2018-12-28 ENCOUNTER — Ambulatory Visit (INDEPENDENT_AMBULATORY_CARE_PROVIDER_SITE_OTHER): Payer: Medicare Other | Admitting: Pulmonary Disease

## 2018-12-28 ENCOUNTER — Encounter: Payer: Self-pay | Admitting: Pulmonary Disease

## 2018-12-28 ENCOUNTER — Other Ambulatory Visit: Payer: Self-pay

## 2018-12-28 VITALS — BP 134/68 | HR 60 | Temp 97.2°F | Ht 65.0 in | Wt 217.4 lb

## 2018-12-28 DIAGNOSIS — R911 Solitary pulmonary nodule: Secondary | ICD-10-CM | POA: Diagnosis not present

## 2018-12-28 DIAGNOSIS — Z23 Encounter for immunization: Secondary | ICD-10-CM

## 2018-12-28 DIAGNOSIS — J449 Chronic obstructive pulmonary disease, unspecified: Secondary | ICD-10-CM | POA: Diagnosis not present

## 2018-12-28 DIAGNOSIS — J9611 Chronic respiratory failure with hypoxia: Secondary | ICD-10-CM | POA: Diagnosis not present

## 2018-12-28 NOTE — Patient Instructions (Signed)
Pneumovax booster vaccination today  Will schedule CT chest for April 2021  Try using saline spray for your nose at least one time per day  Sip water when your throat feels scratchy  Salt water gargles once or twice per day  Use a teaspoon of local honey daily to help with throat irritation  Follow up in April 2021 after you have your CT chest

## 2018-12-28 NOTE — Progress Notes (Signed)
[facility]  Chief Complaint  Patient presents with  . Follow-up    Constitutional:  BP 134/68 (BP Location: Right Arm, Patient Position: Sitting, Cuff Size: Normal)   Pulse 60   Temp (!) 97.2 F (36.2 C)   Ht 5\' 5"  (1.651 m)   Wt 217 lb 6.4 oz (98.6 kg)   SpO2 100% Comment: on 3L pulse O2  BMI 36.18 kg/m   Past Medical History:  Depression, Hypothyroidism, HTN, Diastolic CHF, HLD, DM, GERD, Peripheral neuropathy  Brief Summary:  Alexandria [name] is a 83 y.o. female with dyspnea from COPD, diastolic CHF, deconditioning, and iron deficiency anemia.  She also has hx of OSA, but intolerant of CPAP.  She had CT chest earlier this month.  RUL nodule stable.    Not having much cough, wheeze, or sputum.    She gets raspy voice and hoarse if she talks too long.  She has constant nasal drainage.  Hasn't been using nasal irrigation on regular basis.  Uses oxygen at 2 liters 24/7.  Was told she needed to requalify for supplemental oxygen set up.  Her daughter was present for interview.  Physical Exam:   Appearance - wearing supplemental oxygen  ENMT - no sinus tenderness, no nasal discharge, no oral exudate  Neck - no masses, trachea midline, no thyromegaly, no elevation in JVP  Respiratory - normal appearance of chest wall, normal respiratory effort w/o accessory muscle use, no dullness on percussion, no wheezing or rales  CV - s1s2 regular rate and rhythm, no murmurs, no peripheral edema, radial pulses symmetric  GI - soft, non tender  Lymph - no adenopathy noted in neck and axillary areas  MSK - normal gait  Ext - no cyanosis, clubbing, or joint inflammation noted  Skin - no rashes, lesions, or ulcers  Neuro - normal strength, oriented x 3  Psych - normal mood and affect   Assessment/Plan:   Rt upper lung nodule. - this had increased in size from October 2019 to June 2020, but has been stable from June 2020 to October  2020 - will arrange for repeat CT chest w/o contrast in April 2021  COPD with emphysema and chronic bronchitis. - continue symbicort with prn albuterol - prn mucinex - high dose flu shot, and pneumovax today  Upper airway cough syndrome with post nasal drip. - sip water when her throat is sore - salt water gargles qd to bid - 1 teaspoon  continue symbicort and prn albuterol - she can use mucinex prn  Chronic respiratory failure with hypoxia. - her SpO2 on room air today was 88% on room air at rest today; improved with 2 liters oxygen - continue 2 liters supplemental oxygen 24/7   Patient Instructions  Pneumovax booster vaccination today  Will schedule CT chest for April 2021  Try using saline spray for your nose at least one time per day  Sip water when your throat feels scratchy  Salt water gargles once or twice per day  Use a teaspoon of local honey daily to help with throat irritation  Follow up in April 2021 after you have your CT chest   A total of  29 minutes were spent face to face with the patient and more than half of that time involved counseling or coordination of care.   Chesley Mires, MD [facility] Pager: [phone] 12/28/2018, 11:46 AM  Flow Sheet     Pulmonary tests:  RAST 10/02/10 >> negative,  IgE 8.1 A1AT 04/15/11 >> MM PFT 10/512 >> FEV1 1.29 (61%), FEV1% 57, TLC 6.01 (119%), RV 3.52 (153%), DLCO 67%, no BD PFT 08/15/14 >> FEV1 1.07 (57%), FEV1% 55, TLC 6.49 (128%), DLCO 47%, no BD  Chest Imaging:  CT chest 06/29/16 >> atherosclerosis, RUL nodule 7 mm (was 8 mm), no change 6 mm LUL nodule, no change 4 mm RLL nodule  CT chest 12/23/17 >> 10 mm RUL nodule, 7 mm RUL nodule, 4 mm RLL nodule, 6 mm LUL nodule, centrilobular emphysema PET scan 01/06/18 >> RUL nodule not hypermetabolic CT chest 07/10/37 >> atherosclerosis, moderate centrilobular emphysema with diffuse bronchial wall thickening, 2.4 cm posterior apical irregular  nodule increased from 2.3 cm, scar medial RLL, scattered nodules up to 4 mm in medial RLL, 6 mm nodule in LLL CT chest 12/14/18 >> no change RUL nodule  Sleep tests:  PSG 01/27/10 >> AHI 13, SpO2 low 73% ONO with CPAP 08/09/14 >> Test time 9 hr 16 min. Mean SpO2 92.2%, low SpO2 86%. Spent 10 min with SpO2 < 88% ONO with CPAP 06/24/16 >>test time 7 hrs 29 min. Average SpO2 90%, low SpO2 79%. Spent 58 min with SpO2 <88%. ONO with RA 11/01/16 >> test time 6 hrs 43 min.  Average SpO2 89%, low SpO2 67%.  Spent 2 hrs 7 min with SpO2 < 88%. ONO with 2 liters 07/18/18 >> test time 8 hrs 34 min.  Baseline SpO2 94%, low SpO2 69%.  Spent 38 min with SpO2 < 88%.   Cardiac tests:  Echo 09/28/14 >> mild LVH, EF 55 to 60%  Medications:   Allergies as of 12/28/2018      Reactions   Antihistamines, Diphenhydramine-type Other (See Comments)   Able to tolerate only allegra.    Sulfonamide Derivatives Swelling   REACTION: closed throat   Incruse Ellipta [umeclidinium Bromide] Cough   Caused voice change and severe coughing   Clarithromycin Other (See Comments)   REACTION: diff swallowing and mouth blisters   Codeine Other (See Comments)   Unknown    Lyrica [pregabalin] Other (See Comments)   Lack of effect for neuropathy pain.     Spiriva Handihaler [tiotropium Bromide Monohydrate] Other (See Comments)   Voice changes      Medication List       Accurate as of December 28, 2018 11:46 AM. If you have any questions, ask your nurse or doctor.        albuterol 108 (90 Base) MCG/ACT inhaler Commonly known as: ProAir HFA Inhale 2 puffs into the lungs every 6 (six) hours as needed for wheezing or shortness of breath. INHALE 2 PUFFS INTO THE LUNGS EVERY 6 HOURS AS NEEDED FOR WHEEZING OR SHORTNESS OF BREATH   Azelastine-Fluticasone 137-50 MCG/ACT Susp Place 2 puffs into the nose 1 day or 1 dose for 1 dose.   budesonide-formoterol 160-4.5 MCG/ACT inhaler Commonly known as: Symbicort Inhale 2 puffs  into the lungs 2 (two) times daily.   busPIRone 5 MG tablet Commonly known as: BUSPAR As needed   Coenzyme Q-10 200 MG Caps Take 200 mg by mouth daily.   EpiPen 0.3 mg/0.3 mL Devi Generic drug: EPINEPHrine Inject 0.3 mg into the muscle once as needed (anaphylaxis).   esomeprazole 40 MG capsule Commonly known as: NEXIUM Take 40 mg by mouth every other day.   ezetimibe 10 MG tablet Commonly known as: ZETIA TAKE 1 TABLET BY MOUTH EVERYDAY AT BEDTIME   ferrous sulfate 325 (65 FE) MG EC tablet Take  1 tablet (325 mg total) by mouth daily with breakfast.   fexofenadine 180 MG tablet Commonly known as: ALLEGRA Take 180 mg by mouth as needed.   FLUoxetine 20 MG capsule Commonly known as: PROZAC Take 20 mg by mouth 2 (two) times daily.   fluticasone 50 MCG/ACT nasal spray Commonly known as: FLONASE USE 2 SPRAYS INTO THE NOSE EVERY 12 HOURS AS NEEDED FOR STUFFY NOSE   furosemide 20 MG tablet Commonly known as: LASIX Take 1 tablet (20 mg total) by mouth daily as needed for fluid.   glucose blood test strip Commonly known as: Civil engineer, contracting Use as instructed to check sugar 2-3x time daily   OneTouch Verio test strip Generic drug: glucose blood USE AS INSTRUCTED TO CHECK SUGAR 1 TIME DAILY   ibuprofen 200 MG tablet Commonly known as: ADVIL Take 200-400 mg by mouth every 6 (six) hours as needed for moderate pain.   metFORMIN 500 MG tablet Commonly known as: GLUCOPHAGE TAKE 1 TABLET (500 MG TOTAL) BY MOUTH DAILY WITH SUPPER.   nitroGLYCERIN 0.4 MG SL tablet Commonly known as: NITROSTAT Place 1 tablet (0.4 mg total) under the tongue every 5 (five) minutes as needed for chest pain.   OneTouch Delica Lancets Fine Misc USE TO CHECK SUGAR 1 TIME DAILY   potassium chloride 10 MEQ tablet Commonly known as: KLOR-CON Take 1 tablet (10 mEq total) by mouth daily as needed (Takes with furosemide doses.).   rosuvastatin 10 MG tablet Commonly known as: CRESTOR TAKE 1 TABLET (10  MG TOTAL) BY MOUTH EVERY OTHER DAY.   Spacer/Aero Chamber [employer] Use with  inhaler as needed.  J44.9   Synthroid 125 MCG tablet Generic drug: levothyroxine TAKE 1 TABLET (125 MCG TOTAL) BY MOUTH DAILY BEFORE BREAKFAST.   valsartan 160 MG tablet Commonly known as: DIOVAN Take one half tablet (80 mg total) 2 times daily.   vitamin B-12 50 MCG tablet Commonly known as: CYANOCOBALAMIN Take 50 mcg by mouth daily.   Vitamin D (Ergocalciferol) 1.25 MG (50000 UT) Caps capsule Commonly known as: DRISDOL Take 1 capsule (50,000 Units total) by mouth every 7 (seven) days.   Vitamin D 50 MCG (2000 UT) tablet Take 1 tablet (2,000 Units total) by mouth daily.       Past Surgical History:  She  has a past surgical history that includes Tonsillectomy; Rotator cuff repair (1984); Cesarean section; Total abdominal hysterectomy (1985); Cholecystectomy (1997); THUMB RELEASE (12/1997); Carpal tunnel release (12/1997); Esophagogastroduodenoscopy (12/05/1997); ABD U/S (03/19/1999); CT ABD W & PELVIS WO/W CM; CARDIOLITE PERSANTINE (08/24/2000); CAROTID U/S (08/24/2000); DEXA (07/03/2003); ADENOSINE MYOVIEW (06/02/2007); CAROTID U/S (06/02/2007); US ECHOCARDIOGRAPHY (06/02/2007); Hernia repair (01/24/2009); knee arthroscopic surgery (years ago); Shoulder open rotator cuff repair (02/08/2012); Gallbladder surgery; Dental surgery (2016); Colonoscopy with propofol (N/A, 02/12/2016); and Esophagogastroduodenoscopy (egd) with propofol (N/A, 02/12/2016).  Family History:  Her family history includes Cystic fibrosis in her sister; Emphysema in her father; Heart disease in her mother; Hyperthyroidism in her brother and sister; Osteoarthritis in her brother; Thyroid disease in her mother.  Social History:  She  reports that she quit smoking about 13 years ago. Her smoking use included cigarettes. She has a 100.00 pack-year smoking history. She has never used smokeless tobacco. She reports current alcohol use.  She reports that she does not use drugs.

## 2019-01-01 ENCOUNTER — Other Ambulatory Visit: Payer: Self-pay | Admitting: *Deleted

## 2019-01-01 DIAGNOSIS — G629 Polyneuropathy, unspecified: Secondary | ICD-10-CM

## 2019-01-01 DIAGNOSIS — R202 Paresthesia of skin: Secondary | ICD-10-CM

## 2019-01-01 NOTE — Telephone Encounter (Signed)
I see no record of this medication (Gabapentin) being prescribed since 2018.  [facility] [place] requests refill. Last office visit:   06/27/2018 Last Filled:   10/06/2016 Please advise.

## 2019-01-02 NOTE — Telephone Encounter (Signed)
Agreed.  Please verify use of patient.  Thanks.

## 2019-01-02 NOTE — Telephone Encounter (Signed)
Pt now has bad neuropathy pain in feet all night long and pt wants to restart gabapentin for neuropathy. Pt does not remember gabapentin dosage since has been around 2 yrs since taken.Please advise.

## 2019-01-03 MED ORDER — GABAPENTIN 100 MG PO CAPS: 100.0000 mg | ORAL_CAPSULE | Freq: Two times a day (BID) | ORAL | 2 refills | Status: DC | PRN

## 2019-01-03 NOTE — Telephone Encounter (Signed)
Noted.  Needs repeat B12 level given the neuropathy.  I put in the order for that.  She also needs a follow-up vitamin D level which was previously ordered.  Restart gabapentin at 100 mg at night.  If tolerated and if needed can increase to 200 mg at night thereafter.  Can increase to 200 mg twice a day thereafter if needed.  Thanks.

## 2019-01-04 NOTE — Telephone Encounter (Signed)
Left detailed message on voicemail.  

## 2019-01-29 ENCOUNTER — Other Ambulatory Visit: Payer: Self-pay

## 2019-01-29 DIAGNOSIS — Z20828 Contact with and (suspected) exposure to other viral communicable diseases: Secondary | ICD-10-CM | POA: Diagnosis not present

## 2019-01-29 DIAGNOSIS — Z20822 Contact with and (suspected) exposure to covid-19: Secondary | ICD-10-CM

## 2019-01-31 LAB — NOVEL CORONAVIRUS, NAA: SARS-CoV-2, NAA: NOT DETECTED

## 2019-02-27 ENCOUNTER — Ambulatory Visit: Payer: Medicare Other | Attending: Internal Medicine

## 2019-02-27 DIAGNOSIS — Z20822 Contact with and (suspected) exposure to covid-19: Secondary | ICD-10-CM

## 2019-02-27 DIAGNOSIS — Z20828 Contact with and (suspected) exposure to other viral communicable diseases: Secondary | ICD-10-CM | POA: Diagnosis not present

## 2019-02-28 LAB — NOVEL CORONAVIRUS, NAA: SARS-CoV-2, NAA: NOT DETECTED

## 2019-03-18 ENCOUNTER — Telehealth: Payer: Self-pay | Admitting: Family Medicine

## 2019-03-19 NOTE — Telephone Encounter (Signed)
LOV 06/27/2018 and no future appointments scheduled. When does patient need to follow up?

## 2019-03-20 NOTE — Telephone Encounter (Signed)
Needs yearly visit/annual Medicare wellness scheduled for the spring with labs ahead of time.  Prescription sent.  Thanks.

## 2019-03-20 NOTE — Telephone Encounter (Signed)
Patient scheduled.

## 2019-03-21 ENCOUNTER — Telehealth: Payer: Self-pay

## 2019-03-21 NOTE — Telephone Encounter (Signed)
Aware, I will see her then  If worse after hours- ER please

## 2019-03-21 NOTE — Telephone Encounter (Signed)
Pt said all day pt cannot get her temp above 96.4, All day long her oral temp has ranged from 95-96.4. pt feels sleepy,pt feels like she needs rest, jittery at times. Now temp is 96.1. pt is not sure if thermometer is working or not. Pt has joint pain, (knees, shoulder are hurting; pt is not sure how long joints have been hurting but last night was the worst they have hurt.pt has dry cough on and off for 1 wk or so. Pt has runny nose and SOB. Pt said last night she felt overwhelmed with her breathing. Pt used the inhaler and after about 1 hr the SOB went away. No other covid symptoms. Pt said she is on continuousO2 at 2L. No other covid symptoms, no travel and no known exposure. Pt scheduled virtual visit with Dr Glori Bickers on 03/22/19 at 10 AM. UC & ED precautions given and pt voiced understanding. FYI to Dr Glori Bickers.

## 2019-03-21 NOTE — Telephone Encounter (Signed)
Agreed.  Thanks.  

## 2019-03-22 ENCOUNTER — Encounter: Payer: Self-pay | Admitting: Family Medicine

## 2019-03-22 ENCOUNTER — Other Ambulatory Visit: Payer: Self-pay

## 2019-03-22 ENCOUNTER — Ambulatory Visit: Payer: Medicare Other | Attending: Internal Medicine

## 2019-03-22 ENCOUNTER — Ambulatory Visit (INDEPENDENT_AMBULATORY_CARE_PROVIDER_SITE_OTHER): Payer: Medicare Other | Admitting: Family Medicine

## 2019-03-22 DIAGNOSIS — J069 Acute upper respiratory infection, unspecified: Secondary | ICD-10-CM | POA: Diagnosis not present

## 2019-03-22 DIAGNOSIS — Z20822 Contact with and (suspected) exposure to covid-19: Secondary | ICD-10-CM | POA: Diagnosis not present

## 2019-03-22 NOTE — Patient Instructions (Signed)
Since we do not have a reason for your symptoms, I think you should get tested for covid  Use the website or phone number I gave you  Let us know when you get a result   I'm glad you are feeling better today  Make sure to rest and take care of yourself  Tylenol is ok for pain  Watch your temperature Alert [place] if symptoms worsen again (especially shortness of breath)  If severe at any time go to the ER

## 2019-03-22 NOTE — Progress Notes (Signed)
Virtual Visit via Video Note  I connected with Alexandria [name] on 03/22/19 at 10:00 AM EST by a video enabled telemedicine application and verified that I am speaking with the correct person using two identifiers.  Location: Patient: home Provider: office    I discussed the limitations of evaluation and management by telemedicine and the availability of in person appointments. The patient expressed understanding and agreed to proceed.  Parties involved in encounter  Patient: Alexandria [name]  Provider:  Loura Pardon MD    History of Present Illness: Pt presents with multiple symptoms including fatigue and low body temp with cough/sob and uri symptoms   84 yo pt of [name] with known copd and CHF with DM and obesity hypoventilation syndrome   Symptoms started night before last  Right before dinner became sob/ chills/severely fatigued  Her joint pain - shoulders/knees -very bad [place] than usual Hard time eating due to sob   Temp has stayed sub normal the whole time  Used an old mercury thermometer   Today she does feel a little better  Stamina and focus are improved  Still achey but not as bad  Still feels jittery inside  Ears are very sensitive and she feels light headed (this is her normal however)   Her sob is improved (is sob on exertion)  She does not think copd is flaring  occ dry cough -not often  She clears her throat frequently  She does have a little runny nose (thinks from her 02)  She was stuffy last night - better now  Mucous is all clear  No loss of taste or smell   Otc: took some advil or tylenol (last night last dose)  She used her rescue inhaler once    Had a neg coronavirus test on 12/22 No exp to covid  She does not leave the house  Husband also has minimal exp to the public (he does occ shopping) and works with people infrequently  He has not been sick    Scheduled for covid imm on Tuesday    Temp: (!) 96.1 F (35.6 C)(pt reported)   pulse65 BP: (!) 120/54(pt reported) pulse ox 98% on 2L 02 continuous    Wt Readings from Last 3 Encounters:  03/22/19 215 lb (97.5 kg)  12/28/18 217 lb 6.4 oz (98.6 kg)  11/01/18 212 lb (96.2 kg)  she quit smoking in 2006   Patient Active Problem List   Diagnosis Date Noted  . URI (upper respiratory infection) 03/22/2019  . Vitamin D deficiency 06/29/2018  . Hyperkalemia 06/28/2018  . Dysuria 01/01/2018  . Abnormal findings on diagnostic imaging of lung 12/18/2017  . Rhinitis 12/18/2017  . Knee laceration, right, subsequent encounter 10/06/2016  . Knee pain, acute 10/06/2016  . Chronic pain of left knee 09/15/2016  . Right foot pain 09/15/2016  . Chronic respiratory failure with hypoxia ([place]) 07/14/2016  . Diverticulosis of colon without hemorrhage   . Hemorrhoids   . Hiatal hernia   . Colon cancer screening 01/21/2016  . Type 2 diabetes mellitus with diabetic neuropathy, without long-term current use of insulin ([place]) 04/04/2015  . Left temporal headache 03/14/2015  . Chest tightness 12/04/2014  . Chronic diastolic CHF (congestive heart failure) ([place])   . Iron deficiency anemia   . OSA on CPAP   . Obesity hypoventilation syndrome ([place])   . Chronic diastolic congestive heart failure ([place])   . Absolute anemia   . Medicare annual wellness visit, subsequent 06/14/2014  .  Advance care planning 06/14/2014  . RLQ abdominal pain 05/10/2014  . COPD exacerbation ([place]) 04/28/2014  . Lower extremity edema 09/15/2012  . SOB (shortness of breath) 08/24/2012  . Shoulder pain 12/17/2011  . Abdominal hernia 12/17/2011  . Anxiety 09/23/2011  . Osteopenia 07/25/2011  . Obstructive sleep apnea 02/09/2010  . MURMUR 02/04/2010  . CHEST PAIN 01/13/2010  . ONYCHOMYCOSIS, TOENAILS 06/30/2009  . PALPITATIONS 08/29/2008  . ESOPHAGEAL STENOSIS 11/22/2007  . DYSPHAGIA 11/08/2007  . ESOPHAGITIS 10/11/2007  . COPD GOLD II 05/23/2007  . ALLERGY 07/25/2006  . NUMBNESS 10/06/2001  .  Hyperlipemia 10/04/2000  . Depression, recurrent ([place]) 03/08/1997  . Essential hypertension 03/08/1992  . Obesity, Class III, BMI 40-49.9 (morbid obesity) ([place]) 03/08/1989  . ROTATOR CUFF SYNDROME, RIGHT 03/08/1982  . Hypothyroidism 03/08/1968   Past Medical History:  Diagnosis Date  . Allergy, unspecified not elsewhere classified   . Cataract   . Chronic diastolic congestive heart failure ([place])   . COPD (chronic obstructive pulmonary disease) ([place]) 03/08/1998   PFTs 12/12/2002 FEV 1 1.42 (64%) ratio 58 with no better after B2 and DLCO75%; PFTs 11/20/09 FEV1 1.50 (73%) ratio 50 no better after B2 with DLCO 62%; Hfa 50% 11/20/2009 >75%, 01/13/10 p coaching  . Depression 12/07/1998  . Diabetes mellitus type II 02/05/2006   Dr. Cruzita Lederer with endo  . Disorders of bursae and tendons in shoulder region, unspecified    Rotator cuff syndrome, right  . E. coli bacteremia   . Esophagitis   . GERD (gastroesophageal reflux disease)   . History of UTI   . Hyperlipidemia 10/04/2000  . Hypertension 03/08/1992  . Hypothyroidism 03/08/1968  . Iron deficiency anemia   . Obesity    NOS  . OSA (obstructive sleep apnea)    PSG 01/27/10 AHI 13, pt does not know CPAP settings  . Peripheral neuropathy    Likely due to DM per Dr. Erling Cruz  . Ventral hernia    Past Surgical History:  Procedure Laterality Date  . ABD U/S  03/19/1999   Nml x2 foci in liver  . ADENOSINE MYOVIEW  06/02/2007   Nml  . CARDIOLITE PERSANTINE  08/24/2000   Nml  . CAROTID U/S  08/24/2000   1-39% ICA stenosis  . CAROTID U/S  06/02/2007   No apprec change   . CARPAL TUNNEL RELEASE  12/1997   Right  . CESAREAN SECTION     x2 Breech/ repeat  . CHOLECYSTECTOMY  1997  . COLONOSCOPY WITH PROPOFOL N/A 02/12/2016   Procedure: COLONOSCOPY WITH PROPOFOL;  Surgeon: Milus Banister, MD;  Location: [facility];  Service: Endoscopy;  Laterality: N/A;  . CT ABD W & PELVIS WO/W CM     Abd hemangiomas of liver, 1 cm R renal cyst  . DENTAL  SURGERY  2016   Implants  . DEXA  07/03/2003   Nml  . ESOPHAGOGASTRODUODENOSCOPY  12/05/1997   Nml (due to hoarseness)  . ESOPHAGOGASTRODUODENOSCOPY (EGD) WITH PROPOFOL N/A 02/12/2016   Procedure: ESOPHAGOGASTRODUODENOSCOPY (EGD) WITH PROPOFOL;  Surgeon: Milus Banister, MD;  Location: [facility];  Service: Endoscopy;  Laterality: N/A;  . GALLBLADDER SURGERY    . HERNIA REPAIR  01/24/2009   Lap Ventr w/ Lysis of adhesions (Dr. Donne Hazel)  . knee arthroscopic surgery  years ago   right  . ROTATOR CUFF REPAIR  1984   Right, [place]  . SHOULDER OPEN ROTATOR CUFF REPAIR  02/08/2012   Procedure: ROTATOR CUFF REPAIR SHOULDER OPEN;  Surgeon: Magnus Sinning, MD;  Location: [facility];  Service: Orthopedics;  Laterality: Left;  Left Shoulder Open Anterior Acrominectomy Rotator Cuff Repair Open Distal Clavicle Resection ,tissue mend graft, and repair of biceps tendon  . THUMB RELEASE  12/1997   Right  . TONSILLECTOMY    . TOTAL ABDOMINAL HYSTERECTOMY  1985   Due to dysmennorhea  . US ECHOCARDIOGRAPHY  06/02/2007   Social History   Tobacco Use  . Smoking status: Former Smoker    Packs/day: 2.00    Years: 50.00    Pack years: 100.00    Types: Cigarettes    Quit date: 02/05/2005    Years since quitting: 14.1  . Smokeless tobacco: Never Used  Substance Use Topics  . Alcohol use: Yes    Comment: occasionally  . Drug use: Never   Family History  Problem Relation Age of Onset  . Heart disease Mother   . Thyroid disease Mother   . Emphysema Father        One lung  . Cystic fibrosis Sister   . Hyperthyroidism Sister   . Osteoarthritis Brother   . Hyperthyroidism Brother   . Esophageal cancer Neg Hx   . Cancer Neg Hx        Head or neck  . Colon cancer Neg Hx   . Stomach cancer Neg Hx   . Breast cancer Neg Hx    Allergies  Allergen Reactions  . Antihistamines, Diphenhydramine-Type Other (See Comments)    Able to tolerate only allegra.   . Sulfonamide Derivatives Swelling     REACTION: closed throat  . Incruse Ellipta [Umeclidinium Bromide] Cough    Caused voice change and severe coughing  . Clarithromycin Other (See Comments)    REACTION: diff swallowing and mouth blisters  . Codeine Other (See Comments)    Unknown   . Lyrica [Pregabalin] Other (See Comments)    Lack of effect for neuropathy pain.    Marland Kitchen Spiriva Handihaler [Tiotropium Bromide Monohydrate] Other (See Comments)    Voice changes   Current Outpatient Medications on File Prior to Visit  Medication Sig Dispense Refill  . albuterol (PROAIR HFA) 108 (90 Base) MCG/ACT inhaler Inhale 2 puffs into the lungs every 6 (six) hours as needed for wheezing or shortness of breath. INHALE 2 PUFFS INTO THE LUNGS EVERY 6 HOURS AS NEEDED FOR WHEEZING OR SHORTNESS OF BREATH 3 Inhaler 3  . budesonide-formoterol (SYMBICORT) 160-4.5 MCG/ACT inhaler Inhale 2 puffs into the lungs 2 (two) times daily. 3 Inhaler 3  . busPIRone (BUSPAR) 5 MG tablet As needed    . Cholecalciferol (VITAMIN D) 50 MCG (2000 UT) tablet Take 1 tablet (2,000 Units total) by mouth daily.    . Coenzyme Q-10 200 MG CAPS Take 200 mg by mouth daily.     Marland Kitchen EPINEPHrine (EPIPEN) 0.3 mg/0.3 mL DEVI Inject 0.3 mg into the muscle once as needed (anaphylaxis).     Marland Kitchen esomeprazole (NEXIUM) 40 MG capsule Take 40 mg by mouth every other day.     . ezetimibe (ZETIA) 10 MG tablet TAKE 1 TABLET BY MOUTH EVERYDAY AT BEDTIME 90 tablet 1  . ferrous sulfate 325 (65 FE) MG EC tablet Take 1 tablet (325 mg total) by mouth daily with breakfast.    . fexofenadine (ALLEGRA) 180 MG tablet Take 180 mg by mouth as needed.     Marland Kitchen FLUoxetine (PROZAC) 20 MG capsule Take 20 mg by mouth 2 (two) times daily.    . fluticasone (FLONASE) 50 MCG/ACT nasal spray USE 2 SPRAYS  INTO THE NOSE EVERY 12 HOURS AS NEEDED FOR STUFFY NOSE 48 g 2  . furosemide (LASIX) 20 MG tablet Take 1 tablet (20 mg total) by mouth daily as needed for fluid. 90 tablet 1  . gabapentin (NEURONTIN) 100 MG capsule Take 1-2  capsules (100-200 mg total) by mouth 2 (two) times daily as needed. First 5 days take 100mg  at night 100 capsule 2  . glucose blood (ONETOUCH VERIO) test strip Use as instructed to check sugar 2-3x time daily 200 each 5  . ibuprofen (ADVIL,MOTRIN) 200 MG tablet Take 200-400 mg by mouth every 6 (six) hours as needed for moderate pain.     . metFORMIN (GLUCOPHAGE) 500 MG tablet TAKE 1 TABLET (500 MG TOTAL) BY MOUTH DAILY WITH SUPPER. 90 tablet 3  . nitroGLYCERIN (NITROSTAT) 0.4 MG SL tablet Place 1 tablet (0.4 mg total) under the tongue every 5 (five) minutes as needed for chest pain. 25 tablet 3  . ONETOUCH DELICA LANCETS FINE MISC USE TO CHECK SUGAR 1 TIME DAILY 100 each 5  . ONETOUCH VERIO test strip USE AS INSTRUCTED TO CHECK SUGAR 1 TIME DAILY 100 each 8  . potassium chloride (K-DUR) 10 MEQ tablet Take 1 tablet (10 mEq total) by mouth daily as needed (Takes with furosemide doses.). 90 tablet 1  . rosuvastatin (CRESTOR) 10 MG tablet TAKE 1 TABLET (10 MG TOTAL) BY MOUTH EVERY OTHER DAY. 45 tablet 2  . Spacer/Aero Chamber [employer] Use with  inhaler as needed.  J44.9 1 each 0  . SYNTHROID 125 MCG tablet TAKE 1 TABLET (125 MCG TOTAL) BY MOUTH DAILY BEFORE BREAKFAST. 90 tablet 2  . valsartan (DIOVAN) 160 MG tablet Take one half tablet (80 mg total) 2 times daily. 90 tablet 1  . vitamin B-12 (CYANOCOBALAMIN) 50 MCG tablet Take 50 mcg by mouth daily.     . Vitamin D, Ergocalciferol, (DRISDOL) 1.25 MG (50000 UT) CAPS capsule Take 1 capsule (50,000 Units total) by mouth every 7 (seven) days. 12 capsule 0  . Azelastine-Fluticasone 137-50 MCG/ACT SUSP Place 2 puffs into the nose 1 day or 1 dose for 1 dose. 1 Bottle 3   No current facility-administered medications on file prior to visit.   Review of Systems  Constitutional: Positive for chills and malaise/fatigue. Negative for fever.  HENT: Positive for congestion. Negative for ear pain, sinus pain and sore throat.   Eyes: Negative for blurred  vision, discharge and redness.  Respiratory: Positive for cough. Negative for shortness of breath and stridor.        Baseline copd sob- on 02 ATC  Cardiovascular: Negative for chest pain, palpitations and leg swelling.  Gastrointestinal: Negative for abdominal pain, diarrhea, nausea and vomiting.  Musculoskeletal: Positive for joint pain and myalgias.  Skin: Negative for rash.  Neurological: Positive for headaches. Negative for dizziness.     Observations/Objective: Patient appears well, in no distress Wearing 02 by nasal cannula Weight is baseline -obese No facial swelling or asymmetry Slightly hoarse voice (unsure if her baseline) No obvious tremor or mobility impairment Moving neck and UEs normally Able to hear the call well  No cough or shortness of breath during interview  Talkative and mentally sharp with no cognitive changes No skin changes on face or neck , no rash or pallor Affect is normal    Assessment and Plan: Problem List Items Addressed This Visit      Respiratory   URI (upper respiratory infection)    2 days of symptoms with  no known covid exp  Improved now  Was chilled/tired/ achey and sob  Discussed symptomatic care  Fluids/rest /continue current copd medications  inst to get [facility] and info given to schedule that -she was agreeable inst to call if symptoms worsen again or change (go to ER if severe) She voiced understanding           Follow Up Instructions: Since we do not have a reason for your symptoms, I think you should get tested for covid  Use the website or phone number I gave you  Let us know when you get a result   I'm glad you are feeling better today  Make sure to rest and take care of yourself  Tylenol is ok for pain  Watch your temperature Alert [place] if symptoms worsen again (especially shortness of breath)  If severe at any time go to the ER    I discussed the assessment and treatment plan with the patient.  The patient was provided an opportunity to ask questions and all were answered. The patient agreed with the plan and demonstrated an understanding of the instructions.   The patient was advised to call back or seek an in-person evaluation if the symptoms worsen or if the condition fails to improve as anticipated.     Loura Pardon, MD

## 2019-03-22 NOTE — Assessment & Plan Note (Signed)
2 days of symptoms with no known covid exp  Improved now  Was chilled/tired/ achey and sob  Discussed symptomatic care  Fluids/rest /continue current copd medications  inst to get [facility]id [facility] and info given to schedule that -she was agreeable inst to call if symptoms worsen again or change (go to ER if severe) She voiced understanding

## 2019-03-23 LAB — NOVEL CORONAVIRUS, NAA: SARS-CoV-2, NAA: NOT DETECTED

## 2019-03-26 ENCOUNTER — Ambulatory Visit: Payer: Medicare Other

## 2019-03-27 ENCOUNTER — Ambulatory Visit: Payer: Medicare Other | Attending: Internal Medicine

## 2019-03-27 DIAGNOSIS — Z23 Encounter for immunization: Secondary | ICD-10-CM

## 2019-03-27 NOTE — Progress Notes (Signed)
   Covid-19 Vaccination Clinic  Name:  SENYA HINZMAN    MRN: [id number] DOB: 14-Nov-1935  03/27/2019  Ms. Hunzeker was observed post Covid-19 immunization for 15 minutes without incidence. She was provided with Vaccine Information Sheet and instruction to access the V-Safe system.   Ms. Ola was instructed to call 911 with any severe reactions post vaccine: Marland Kitchen Difficulty breathing  . Swelling of your face and throat  . A fast heartbeat  . A bad rash all over your body  . Dizziness and weakness

## 2019-03-28 DIAGNOSIS — Z23 Encounter for immunization: Secondary | ICD-10-CM | POA: Diagnosis not present

## 2019-03-28 DIAGNOSIS — I872 Venous insufficiency (chronic) (peripheral): Secondary | ICD-10-CM | POA: Diagnosis not present

## 2019-03-28 DIAGNOSIS — L578 Other skin changes due to chronic exposure to nonionizing radiation: Secondary | ICD-10-CM | POA: Diagnosis not present

## 2019-03-28 DIAGNOSIS — D225 Melanocytic nevi of trunk: Secondary | ICD-10-CM | POA: Diagnosis not present

## 2019-03-28 DIAGNOSIS — Z85828 Personal history of other malignant neoplasm of skin: Secondary | ICD-10-CM | POA: Diagnosis not present

## 2019-04-18 ENCOUNTER — Ambulatory Visit: Payer: Medicare Other | Attending: Internal Medicine

## 2019-04-18 DIAGNOSIS — Z23 Encounter for immunization: Secondary | ICD-10-CM

## 2019-04-18 NOTE — Progress Notes (Signed)
   Covid-19 Vaccination Clinic  Name:  Alexandria [name]    MRN: [id number] DOB: 1935-12-08  04/18/2019  [name] was observed post Covid-19 immunization for 30 minutes based on pre-vaccination screening without incidence. She was provided with Vaccine Information Sheet and instruction to access the V-Safe system.   [name] was instructed to call 911 with any severe reactions post vaccine: Marland Kitchen Difficulty breathing  . Swelling of your face and throat  . A fast heartbeat  . A bad rash all over your body  . Dizziness and weakness    Immunizations Administered    Name Date Dose VIS Date Route   Pfizer COVID-19 Vaccine 04/18/2019  8:18 AM 0.3 mL 02/16/2019 Intramuscular   Manufacturer: [place]   Lot: [id number]   [place]: [address]-4

## 2019-04-19 DIAGNOSIS — M1711 Unilateral primary osteoarthritis, right knee: Secondary | ICD-10-CM | POA: Diagnosis not present

## 2019-04-19 DIAGNOSIS — M1712 Unilateral primary osteoarthritis, left knee: Secondary | ICD-10-CM | POA: Diagnosis not present

## 2019-04-19 DIAGNOSIS — M170 Bilateral primary osteoarthritis of knee: Secondary | ICD-10-CM | POA: Diagnosis not present

## 2019-04-30 ENCOUNTER — Other Ambulatory Visit: Payer: Self-pay

## 2019-05-02 ENCOUNTER — Other Ambulatory Visit: Payer: Self-pay

## 2019-05-02 ENCOUNTER — Encounter: Payer: Self-pay | Admitting: Internal Medicine

## 2019-05-02 ENCOUNTER — Ambulatory Visit (INDEPENDENT_AMBULATORY_CARE_PROVIDER_SITE_OTHER): Payer: Medicare Other | Admitting: Internal Medicine

## 2019-05-02 VITALS — BP 130/80 | HR 86 | Ht 65.0 in | Wt 218.0 lb

## 2019-05-02 DIAGNOSIS — E039 Hypothyroidism, unspecified: Secondary | ICD-10-CM | POA: Diagnosis not present

## 2019-05-02 DIAGNOSIS — E785 Hyperlipidemia, unspecified: Secondary | ICD-10-CM

## 2019-05-02 DIAGNOSIS — E1140 Type 2 diabetes mellitus with diabetic neuropathy, unspecified: Secondary | ICD-10-CM | POA: Diagnosis not present

## 2019-05-02 LAB — T4, FREE: Free T4: 0.81 ng/dL (ref 0.60–1.60)

## 2019-05-02 LAB — POCT GLYCOSYLATED HEMOGLOBIN (HGB A1C): Hemoglobin A1C: 5.4 % (ref 4.0–5.6)

## 2019-05-02 LAB — TSH: TSH: 18.54 u[IU]/mL — ABNORMAL HIGH (ref 0.35–4.50)

## 2019-05-02 MED ORDER — METFORMIN HCL 500 MG PO TABS: 500.0000 mg | ORAL_TABLET | Freq: Every day | ORAL | 3 refills | Status: DC

## 2019-05-02 NOTE — Patient Instructions (Addendum)
Please continue Synthroid 125 mcg daily.  Take the thyroid hormone every day, with water, at least 30 minutes before breakfast, separated by at least 4 hours from: - acid reflux medications - calcium - iron - multivitamins  Please continue: - Metformin 500 mg with supper  Check sugars 1x a day or every other day, rotating check times.  Please stop at the lab.  Please return in 6 months with your sugar log.

## 2019-05-02 NOTE — Progress Notes (Signed)
Patient ID: Alexandria [name], female   DOB: 1935/11/12, 84 y.o.   MRN: [id number]  This visit occurred during the SARS-CoV-2 public health emergency.  Safety protocols were in place, including screening questions prior to the visit, additional usage of staff PPE, and extensive cleaning of exam room while observing appropriate contact time as indicated for disinfecting solutions.   HPI: Alexandria [name] is a 84 y.o.-year-old female, returning for follow-up for DM2, dx in 2006, non-insulin-dependent, controlled, with complications (mild CKD) and hypothyroidism. Last visit 6 months ago.  She had 2 steroid inj's in knees 2 weeks ago >> sugars up to 280, but they started to decrease right away.   DM2: Reviewed HbA1c levels: Lab Results  Component Value Date   HGBA1C 5.2 11/01/2018   HGBA1C 5.5 06/20/2018   HGBA1C 5.4 12/22/2017   Pt is on: - Metformin 500 mg with dinner She was on Januvia >> stopped in summer 2016, when she was admitted for sepsis. We did not restart afterwards as her sugars remained controlled.  Pt was not checking sugars at last visit.  Now 1x a day: - am: 96-125, 143, 172 >> 109-135, 146 >> 118, 124 >> 80s-100s - 2h after brunch:  241 >> 124-195 >> n/c >> 175 >> n/c - before dinner:n/c >> 132-155 >> n/c >> 114-152 >> n/c - 2h after dinner: 135-188, 336 (fruit cake) >> 138 >> 138 >> 130s - bedtime:  163-193, 226 >> 124, 147 >> n/c >> 133 >> ? - nighttime: n/c >> 169, 175 >> n/c Lowest sugar was 96 >> 71 >>114 >> 80; she has hypoglycemia awareness in the 60s. Highest sugar was 336 >> 186 (starch) >> 175 (pie) >> 280.  Glucometer:One Touch Ultra mini  Pt's meals are: - changed to [employer]: - Breakfast: protein drink, egg, cereals - Lunch: sandwich - Dinner: meat + 2 veggies - Snacks: 1-3 peanut butter, milk, crackers  -+ Mild CKD, last BUN/creatinine:  Lab Results  Component Value Date   BUN 28 (H) 07/06/2018   CREATININE 0.88 07/06/2018  On valsartan. -+  HL; last set of lipids: Lab Results  Component Value Date   CHOL 123 06/20/2018   HDL 49.50 06/20/2018   LDLCALC 53 06/20/2018   LDLDIRECT 63.0 07/29/2015   TRIG 102.0 06/20/2018   CHOLHDL 2 06/20/2018  On Crestor every other day. - last eye exam was: 02/2018: No DR, + cataract. She was supposed to have cataract sx, but postponed. Dr. Prudencio Burly. - she denies numbness and tingling in her feet.  Hypothyroidism.  Pt is on Synthroid d.a.w. 125 mcg daily, taken: - in am - coffee - with sweetener - fasting - 1h from b'fast - + Ca, Fe, MVI, PPIs, all more than 4 hours after Synthroid - not on Biotin  At today's visit she tells me that she missed or delayed many doses before last visit! As of now, she is trying to take it consistently, 1 hour before breakfast. She did not take Synthroid this morning as she had breakfast as soon as she woke up.  Latest TSH was very high, all previously it was normal on the same levothyroxine dose: Lab Results  Component Value Date   TSH 41.98 (H) 11/01/2018   TSH 3.83 12/22/2017   TSH 0.80 02/09/2017   TSH 0.93 01/01/2016   TSH 1.75 03/14/2015   TSH 2.83 01/24/2015   TSH 0.18 (L) 12/04/2014   TSH 0.55 10/18/2014   TSH 0.341 (L) 09/29/2014  TSH 0.26 (L) 10/02/2010   TSH 0.34 02/19/2010   TSH 2.90 10/11/2007   TSH 5.56 (H) 01/12/2007   She has a h/o COPD- Dr. Halford Chessman, HTN, anemia, GERD. She fell in 09/2015 >> hurt R leg (had many stitches) and R orbit. She was in rehab afterwards.  ROS: Constitutional: no weight gain/no weight loss, no fatigue, no subjective hyperthermia, no subjective hypothermia Eyes: no blurry vision, no xerophthalmia ENT: no sore throat, no nodules palpated in neck, no dysphagia, no odynophagia, no hoarseness Cardiovascular: no CP/no SOB/no palpitations/+ leg swelling Respiratory: no cough/no SOB/no wheezing Gastrointestinal: no N/no V/no D/no C/no acid reflux Musculoskeletal: no muscle aches/no joint aches Skin: + Rash on the  right shin, no hair loss Neurological: no tremors/no numbness/no tingling/no dizziness  I reviewed pt's medications, allergies, PMH, social hx, family hx, and changes were documented in the history of present illness. Otherwise, unchanged from my initial visit note.  Past Medical History:  Diagnosis Date  . Allergy, unspecified not elsewhere classified   . Cataract   . Chronic diastolic congestive heart failure ([place])   . COPD (chronic obstructive pulmonary disease) ([place]) 03/08/1998   PFTs 12/12/2002 FEV 1 1.42 (64%) ratio 58 with no better after B2 and DLCO75%; PFTs 11/20/09 FEV1 1.50 (73%) ratio 50 no better after B2 with DLCO 62%; Hfa 50% 11/20/2009 >75%, 01/13/10 p coaching  . Depression 12/07/1998  . Diabetes mellitus type II 02/05/2006   Dr. Cruzita Lederer with endo  . Disorders of bursae and tendons in shoulder region, unspecified    Rotator cuff syndrome, right  . E. coli bacteremia   . Esophagitis   . GERD (gastroesophageal reflux disease)   . History of UTI   . Hyperlipidemia 10/04/2000  . Hypertension 03/08/1992  . Hypothyroidism 03/08/1968  . Iron deficiency anemia   . Obesity    NOS  . OSA (obstructive sleep apnea)    PSG 01/27/10 AHI 13, pt does not know CPAP settings  . Peripheral neuropathy    Likely due to DM per Dr. Erling Cruz  . Ventral hernia    Past Surgical History:  Procedure Laterality Date  . ABD U/S  03/19/1999   Nml x2 foci in liver  . ADENOSINE MYOVIEW  06/02/2007   Nml  . CARDIOLITE PERSANTINE  08/24/2000   Nml  . CAROTID U/S  08/24/2000   1-39% ICA stenosis  . CAROTID U/S  06/02/2007   No apprec change   . CARPAL TUNNEL RELEASE  12/1997   Right  . CESAREAN SECTION     x2 Breech/ repeat  . CHOLECYSTECTOMY  1997  . COLONOSCOPY WITH PROPOFOL N/A 02/12/2016   Procedure: COLONOSCOPY WITH PROPOFOL;  Surgeon: Milus Banister, MD;  Location: [facility];  Service: Endoscopy;  Laterality: N/A;  . CT ABD W & PELVIS WO/W CM     Abd hemangiomas of liver, 1 cm R  renal cyst  . DENTAL SURGERY  2016   Implants  . DEXA  07/03/2003   Nml  . ESOPHAGOGASTRODUODENOSCOPY  12/05/1997   Nml (due to hoarseness)  . ESOPHAGOGASTRODUODENOSCOPY (EGD) WITH PROPOFOL N/A 02/12/2016   Procedure: ESOPHAGOGASTRODUODENOSCOPY (EGD) WITH PROPOFOL;  Surgeon: Milus Banister, MD;  Location: [facility];  Service: Endoscopy;  Laterality: N/A;  . GALLBLADDER SURGERY    . HERNIA REPAIR  01/24/2009   Lap Ventr w/ Lysis of adhesions (Dr. Donne Hazel)  . knee arthroscopic surgery  years ago   right  . ROTATOR CUFF REPAIR  1984   Right, [place]  .  SHOULDER OPEN ROTATOR CUFF REPAIR  02/08/2012   Procedure: ROTATOR CUFF REPAIR SHOULDER OPEN;  Surgeon: Magnus Sinning, MD;  Location: [facility];  Service: Orthopedics;  Laterality: Left;  Left Shoulder Open Anterior Acrominectomy Rotator Cuff Repair Open Distal Clavicle Resection ,tissue mend graft, and repair of biceps tendon  . THUMB RELEASE  12/1997   Right  . TONSILLECTOMY    . TOTAL ABDOMINAL HYSTERECTOMY  1985   Due to dysmennorhea  . US ECHOCARDIOGRAPHY  06/02/2007   Social History   Occupational History  . Retired - self employed- Engineer, structural    Social History Main Topics  . Smoking status: Former Smoker -- 2.00 packs/day for 50 years    Types: Cigarettes    Quit date: 2007  . Smokeless tobacco: Never Used  . Alcohol Use: 1.2 oz/week    2 Standard drinks or equivalent per week     Comment: occasional  . Drug Use: No  . Sexual Activity: Not on file   Social History Narrative   Married with 2 children   Enjoys painting   Current Outpatient Medications on File Prior to Visit  Medication Sig Dispense Refill  . albuterol (PROAIR HFA) 108 (90 Base) MCG/ACT inhaler Inhale 2 puffs into the lungs every 6 (six) hours as needed for wheezing or shortness of breath. INHALE 2 PUFFS INTO THE LUNGS EVERY 6 HOURS AS NEEDED FOR WHEEZING OR SHORTNESS OF BREATH 3 Inhaler 3  . Azelastine-Fluticasone 137-50 MCG/ACT SUSP  Place 2 puffs into the nose 1 day or 1 dose for 1 dose. 1 Bottle 3  . budesonide-formoterol (SYMBICORT) 160-4.5 MCG/ACT inhaler Inhale 2 puffs into the lungs 2 (two) times daily. 3 Inhaler 3  . busPIRone (BUSPAR) 5 MG tablet As needed    . Cholecalciferol (VITAMIN D) 50 MCG (2000 UT) tablet Take 1 tablet (2,000 Units total) by mouth daily.    . Coenzyme Q-10 200 MG CAPS Take 200 mg by mouth daily.     Marland Kitchen EPINEPHrine (EPIPEN) 0.3 mg/0.3 mL DEVI Inject 0.3 mg into the muscle once as needed (anaphylaxis).     Marland Kitchen esomeprazole (NEXIUM) 40 MG capsule Take 40 mg by mouth every other day.     . ezetimibe (ZETIA) 10 MG tablet TAKE 1 TABLET BY MOUTH EVERYDAY AT BEDTIME 90 tablet 1  . ferrous sulfate 325 (65 FE) MG EC tablet Take 1 tablet (325 mg total) by mouth daily with breakfast.    . fexofenadine (ALLEGRA) 180 MG tablet Take 180 mg by mouth as needed.     Marland Kitchen FLUoxetine (PROZAC) 20 MG capsule Take 20 mg by mouth 2 (two) times daily.    . fluticasone (FLONASE) 50 MCG/ACT nasal spray USE 2 SPRAYS INTO THE NOSE EVERY 12 HOURS AS NEEDED FOR STUFFY NOSE 48 g 2  . furosemide (LASIX) 20 MG tablet Take 1 tablet (20 mg total) by mouth daily as needed for fluid. 90 tablet 1  . gabapentin (NEURONTIN) 100 MG capsule Take 1-2 capsules (100-200 mg total) by mouth 2 (two) times daily as needed. First 5 days take 100mg  at night 100 capsule 2  . glucose blood (ONETOUCH VERIO) test strip Use as instructed to check sugar 2-3x time daily 200 each 5  . ibuprofen (ADVIL,MOTRIN) 200 MG tablet Take 200-400 mg by mouth every 6 (six) hours as needed for moderate pain.     . metFORMIN (GLUCOPHAGE) 500 MG tablet TAKE 1 TABLET (500 MG TOTAL) BY MOUTH DAILY WITH SUPPER. 90 tablet 3  .  nitroGLYCERIN (NITROSTAT) 0.4 MG SL tablet Place 1 tablet (0.4 mg total) under the tongue every 5 (five) minutes as needed for chest pain. 25 tablet 3  . ONETOUCH DELICA LANCETS FINE MISC USE TO CHECK SUGAR 1 TIME DAILY 100 each 5  . ONETOUCH VERIO test  strip USE AS INSTRUCTED TO CHECK SUGAR 1 TIME DAILY 100 each 8  . potassium chloride (K-DUR) 10 MEQ tablet Take 1 tablet (10 mEq total) by mouth daily as needed (Takes with furosemide doses.). 90 tablet 1  . rosuvastatin (CRESTOR) 10 MG tablet TAKE 1 TABLET (10 MG TOTAL) BY MOUTH EVERY OTHER DAY. 45 tablet 2  . Spacer/Aero Chamber [employer] Use with  inhaler as needed.  J44.9 1 each 0  . SYNTHROID 125 MCG tablet TAKE 1 TABLET (125 MCG TOTAL) BY MOUTH DAILY BEFORE BREAKFAST. 90 tablet 2  . valsartan (DIOVAN) 160 MG tablet Take one half tablet (80 mg total) 2 times daily. 90 tablet 1  . vitamin B-12 (CYANOCOBALAMIN) 50 MCG tablet Take 50 mcg by mouth daily.     . Vitamin D, Ergocalciferol, (DRISDOL) 1.25 MG (50000 UT) CAPS capsule Take 1 capsule (50,000 Units total) by mouth every 7 (seven) days. 12 capsule 0   No current facility-administered medications on file prior to visit.   Allergies  Allergen Reactions  . Antihistamines, Diphenhydramine-Type Other (See Comments)    Able to tolerate only allegra.   . Sulfonamide Derivatives Swelling    REACTION: closed throat  . Incruse Ellipta [Umeclidinium Bromide] Cough    Caused voice change and severe coughing  . Clarithromycin Other (See Comments)    REACTION: diff swallowing and mouth blisters  . Codeine Other (See Comments)    Unknown   . Lyrica [Pregabalin] Other (See Comments)    Lack of effect for neuropathy pain.    Marland Kitchen Spiriva Handihaler [Tiotropium Bromide Monohydrate] Other (See Comments)    Voice changes   Family History  Problem Relation Age of Onset  . Heart disease Mother   . Thyroid disease Mother   . Emphysema Father        One lung  . Cystic fibrosis Sister   . Hyperthyroidism Sister   . Osteoarthritis Brother   . Hyperthyroidism Brother   . Esophageal cancer Neg Hx   . Cancer Neg Hx        Head or neck  . Colon cancer Neg Hx   . Stomach cancer Neg Hx   . Breast cancer Neg Hx    PE: BP 130/80   Pulse 86    Ht 5\' 5"  (1.651 m)   Wt 218 lb (98.9 kg)   SpO2 95%   BMI 36.28 kg/m  Body mass index is 36.28 kg/m.  Wt Readings from Last 3 Encounters:  05/02/19 218 lb (98.9 kg)  03/22/19 215 lb (97.5 kg)  12/28/18 217 lb 6.4 oz (98.6 kg)   Constitutional: overweight, in NAD, on oxygen, walks with a walker Eyes: PERRLA, EOMI, no exophthalmos ENT: moist mucous membranes, no thyromegaly, no cervical lymphadenopathy Cardiovascular: RRR, No RG, +1/6 SEM, + LE edema - R, after surgery on right knee; wears compression pulses B Respiratory: CTA B Gastrointestinal: abdomen soft, NT, ND, BS+ Musculoskeletal: no deformities, strength intact in all 4 Skin: moist, warm, no rashes Neurological: + tremor with outstretched hands, DTR normal in all 4  ASSESSMENT: 1. DM2, non-insulin-dependent, controlled, with complications - mild CKD  2. Hypothyroidism  3. HL  PLAN:  1. Patient with longstanding, previously  uncontrolled type, on low-dose Metformin only, with excellent HbA1c levels.  At last visit, however, she was not checking sugars at home and I strongly advised him to do so.  HbA1c at that time was 5.2%, improved we continued Metformin low-dose, 500 mg with supper. -At this visit, she is very vague about her blood sugars, but the ones that she describes are at goal. -We will continue the same dose of Metformin but I again advised her to try to check her sugars at different times of the day - I advised her to: Patient Instructions  Please continue Synthroid 125 mcg daily.  Take the thyroid hormone every day, with water, at least 30 minutes before breakfast, separated by at least 4 hours from: - acid reflux medications - calcium - iron - multivitamins  Please continue: - Metformin 500 mg with supper  Please stop at the lab.  Please return in 6 months with your sugar log.   - we checked her HbA1c: 5.4% (slightly higher) - advised to check sugars at different times of the day - 1x a day,  rotating check times - advised for yearly eye exams >> she is UTD - return to clinic in 6 months  2. Hypothyroidism - latest thyroid labs reviewed with pt >> extremely high at last visit: Lab Results  Component Value Date   TSH 41.98 (H) 11/01/2018  - At that time, I sent her a message through my chart asking her about compliance and verify whether she is taking the levothyroxine correctly. She did not read the message... At this visit, she tells me that she was not taking the levothyroxine consistently in the past and the late doses. More recently, she tells me she started to take it every day, and trying to wait an hour before she eats. I advised her that she can wait only 30 minutes. - she continues on LT4 125 mcg daily - we discussed about taking the thyroid hormone every day, with water, >30 minutes before breakfast, separated by >4 hours from acid reflux medications, calcium, iron, multivitamins. Pt. is taking it correctly. - will check thyroid tests today: TSH and fT4 - If labs are abnormal, she will need to return for repeat TFTs in 1.5 months  3. HL -Reviewed latest lipid panel 06/2018: Lab Results  Component Value Date   CHOL 123 06/20/2018   HDL 49.50 06/20/2018   LDLCALC 53 06/20/2018   LDLDIRECT 63.0 07/29/2015   TRIG 102.0 06/20/2018   CHOLHDL 2 06/20/2018  -Continues Crestor every other day and Zetia, without side effects  Component     Latest Ref Rng & Units 05/02/2019  Hemoglobin A1C     4.0 - 5.6 % 5.4  TSH     0.35 - 4.50 uIU/mL 18.54 (H)  T4,Free(Direct)     0.60 - 1.60 ng/dL 0.81   TSH improved but still high.  I am still suspecting some missed levothyroxine doses.  I will suggest to increase the dose slightly to 137 mcg daily and repeat her test in 1.5 months.  Philemon Kingdom, MD PhD [facility] Endocrinology

## 2019-05-03 MED ORDER — SYNTHROID 137 MCG PO TABS: 137.0000 ug | ORAL_TABLET | Freq: Every day | ORAL | 3 refills | Status: DC

## 2019-05-09 ENCOUNTER — Other Ambulatory Visit: Payer: Self-pay

## 2019-05-09 MED ORDER — SYNTHROID 137 MCG PO TABS: 137.0000 ug | ORAL_TABLET | Freq: Every day | ORAL | 2 refills | Status: DC

## 2019-05-23 ENCOUNTER — Encounter: Payer: Self-pay | Admitting: Cardiovascular Disease

## 2019-05-23 ENCOUNTER — Ambulatory Visit (INDEPENDENT_AMBULATORY_CARE_PROVIDER_SITE_OTHER): Payer: Medicare Other | Admitting: Cardiovascular Disease

## 2019-05-23 ENCOUNTER — Other Ambulatory Visit: Payer: Self-pay

## 2019-05-23 VITALS — BP 117/68 | HR 76 | Ht 65.0 in | Wt 220.4 lb

## 2019-05-23 DIAGNOSIS — I10 Essential (primary) hypertension: Secondary | ICD-10-CM | POA: Diagnosis not present

## 2019-05-23 DIAGNOSIS — I5032 Chronic diastolic (congestive) heart failure: Secondary | ICD-10-CM

## 2019-05-23 DIAGNOSIS — E782 Mixed hyperlipidemia: Secondary | ICD-10-CM | POA: Diagnosis not present

## 2019-05-23 NOTE — Progress Notes (Addendum)
Cardiology Office Note  Date:  05/23/2019   ID:  Alexandria [name], DOB August 19, 1935, MRN [id number]  PCP:  Tonia Ghent, MD   Chief Complaint  Patient presents with  . office visit    Pt wants to check blood count for red/ white cells, gray film covers eyes at times, chest pounding at times. Meds verbally reviewed w/ pt.    HPI:  84 year old woman with  obesity,   long history of smoking for 50 years, COPD, quit 15 years DM2, dx in 2006, non-insulin-dependent, controlled, with  mild CKD hyperlipidemia,  hypertension,   2012  chest pain, stress test at that time with no ischemia, normal EF  July 2016 with COPD exacerbation, sepsis. History of sleep apnea, not on CPAP, seen by Dr. Llana Aliment, mask does not fit Chronic SOB, normal echo 09/2014 She presents for follow-up of her shortness of breath symptoms  In follow-up today reports that she is doing well overall Continues to be on oxygen 24/7 Concerned she is anemic, would like CBC checked today Mild cough  Pain right neck, right posterior shoulder, scapula Uses heat, tylenol   CT chest 12/2018 stable  Labs reviewed HBA1C 5.4 TSH 18  Fell, trauma to right LE Stitches to knee, healed Residual right lower extremity swelling following trauma 1 month in rehab Wears compression hose at times Needed home PT  Lightheaded feeling, at times, typically happens in the daytime when she is standing up Like a film goes over her eyes and then goes away -Orthostatics done in the office today negative, supine 113 over 60s, sitting 117 over 60s, standing 119/70, standing 131 over 70s  Does not take Lasix on a regular basis  Sometimes with anxiety in the shower, shortness of breath, Can't breath in hot steam  EKG on today's visit shows normal sinus rhythm with rate 76 bpm, no significant ST or T-wave changes, PVC  Other past medical history reviewed Previous Escherichia coli sepsis,  At that time had anemia with hematocrit down to 25,  iron deficiency anemia Treated for COPD exacerbation CT scan of the abdomen showed mild descending aorta atherosclerosis   taking Crestor and zetia.   PMH:   has a past medical history of Allergy, unspecified not elsewhere classified, Cataract, Chronic diastolic congestive heart failure ([place]), COPD (chronic obstructive pulmonary disease) ([place]) (03/08/1998), Depression (12/07/1998), Diabetes mellitus type II (02/05/2006), Disorders of bursae and tendons in shoulder region, unspecified, E. coli bacteremia, Esophagitis, GERD (gastroesophageal reflux disease), History of UTI, Hyperlipidemia (10/04/2000), Hypertension (03/08/1992), Hypothyroidism (03/08/1968), Iron deficiency anemia, Obesity, OSA (obstructive sleep apnea), Peripheral neuropathy, and Ventral hernia.  PSH:    Past Surgical History:  Procedure Laterality Date  . ABD U/S  03/19/1999   Nml x2 foci in liver  . ADENOSINE MYOVIEW  06/02/2007   Nml  . CARDIOLITE PERSANTINE  08/24/2000   Nml  . CAROTID U/S  08/24/2000   1-39% ICA stenosis  . CAROTID U/S  06/02/2007   No apprec change   . CARPAL TUNNEL RELEASE  12/1997   Right  . CESAREAN SECTION     x2 Breech/ repeat  . CHOLECYSTECTOMY  1997  . COLONOSCOPY WITH PROPOFOL N/A 02/12/2016   Procedure: COLONOSCOPY WITH PROPOFOL;  Surgeon: Milus Banister, MD;  Location: [facility];  Service: Endoscopy;  Laterality: N/A;  . CT ABD W & PELVIS WO/W CM     Abd hemangiomas of liver, 1 cm R renal cyst  . DENTAL SURGERY  2016   Implants  .  DEXA  07/03/2003   Nml  . ESOPHAGOGASTRODUODENOSCOPY  12/05/1997   Nml (due to hoarseness)  . ESOPHAGOGASTRODUODENOSCOPY (EGD) WITH PROPOFOL N/A 02/12/2016   Procedure: ESOPHAGOGASTRODUODENOSCOPY (EGD) WITH PROPOFOL;  Surgeon: Milus Banister, MD;  Location: [facility];  Service: Endoscopy;  Laterality: N/A;  . GALLBLADDER SURGERY    . HERNIA REPAIR  01/24/2009   Lap Ventr w/ Lysis of adhesions (Dr. Donne Hazel)  . knee arthroscopic surgery  years  ago   right  . ROTATOR CUFF REPAIR  1984   Right, [place]  . SHOULDER OPEN ROTATOR CUFF REPAIR  02/08/2012   Procedure: ROTATOR CUFF REPAIR SHOULDER OPEN;  Surgeon: Magnus Sinning, MD;  Location: [facility];  Service: Orthopedics;  Laterality: Left;  Left Shoulder Open Anterior Acrominectomy Rotator Cuff Repair Open Distal Clavicle Resection ,tissue mend graft, and repair of biceps tendon  . THUMB RELEASE  12/1997   Right  . TONSILLECTOMY    . TOTAL ABDOMINAL HYSTERECTOMY  1985   Due to dysmennorhea  . US ECHOCARDIOGRAPHY  06/02/2007    Current Outpatient Medications  Medication Sig Dispense Refill  . albuterol (PROAIR HFA) 108 (90 Base) MCG/ACT inhaler Inhale 2 puffs into the lungs every 6 (six) hours as needed for wheezing or shortness of breath. INHALE 2 PUFFS INTO THE LUNGS EVERY 6 HOURS AS NEEDED FOR WHEEZING OR SHORTNESS OF BREATH 3 Inhaler 3  . Azelastine-Fluticasone 137-50 MCG/ACT SUSP Place 2 puffs into the nose 1 day or 1 dose for 1 dose. 1 Bottle 3  . budesonide-formoterol (SYMBICORT) 160-4.5 MCG/ACT inhaler Inhale 2 puffs into the lungs 2 (two) times daily. 3 Inhaler 3  . busPIRone (BUSPAR) 5 MG tablet As needed    . Cholecalciferol (VITAMIN D) 50 MCG (2000 UT) tablet Take 1 tablet (2,000 Units total) by mouth daily.    . Coenzyme Q-10 200 MG CAPS Take 200 mg by mouth daily.     Marland Kitchen EPINEPHrine (EPIPEN) 0.3 mg/0.3 mL DEVI Inject 0.3 mg into the muscle once as needed (anaphylaxis).     Marland Kitchen esomeprazole (NEXIUM) 40 MG capsule Take 40 mg by mouth every other day.     . ezetimibe (ZETIA) 10 MG tablet TAKE 1 TABLET BY MOUTH EVERYDAY AT BEDTIME 90 tablet 1  . ferrous sulfate 325 (65 FE) MG EC tablet Take 1 tablet (325 mg total) by mouth daily with breakfast.    . fexofenadine (ALLEGRA) 180 MG tablet Take 180 mg by mouth as needed.     Marland Kitchen FLUoxetine (PROZAC) 20 MG capsule Take 20 mg by mouth 2 (two) times daily.    . fluticasone (FLONASE) 50 MCG/ACT nasal spray USE 2 SPRAYS INTO THE NOSE  EVERY 12 HOURS AS NEEDED FOR STUFFY NOSE 48 g 2  . furosemide (LASIX) 20 MG tablet Take 1 tablet (20 mg total) by mouth daily as needed for fluid. 90 tablet 1  . gabapentin (NEURONTIN) 100 MG capsule Take 1-2 capsules (100-200 mg total) by mouth 2 (two) times daily as needed. First 5 days take 100mg  at night 100 capsule 2  . glucose blood (ONETOUCH VERIO) test strip Use as instructed to check sugar 2-3x time daily 200 each 5  . ibuprofen (ADVIL,MOTRIN) 200 MG tablet Take 200-400 mg by mouth every 6 (six) hours as needed for moderate pain.     . metFORMIN (GLUCOPHAGE) 500 MG tablet Take 1 tablet (500 mg total) by mouth daily with supper. 90 tablet 3  . nitroGLYCERIN (NITROSTAT) 0.4 MG SL tablet Place 1 tablet (  0.4 mg total) under the tongue every 5 (five) minutes as needed for chest pain. 25 tablet 3  . ONETOUCH DELICA LANCETS FINE MISC USE TO CHECK SUGAR 1 TIME DAILY 100 each 5  . ONETOUCH VERIO test strip USE AS INSTRUCTED TO CHECK SUGAR 1 TIME DAILY 100 each 8  . potassium chloride (K-DUR) 10 MEQ tablet Take 1 tablet (10 mEq total) by mouth daily as needed (Takes with furosemide doses.). 90 tablet 1  . rosuvastatin (CRESTOR) 10 MG tablet TAKE 1 TABLET (10 MG TOTAL) BY MOUTH EVERY OTHER DAY. 45 tablet 2  . Spacer/Aero Chamber [employer] Use with  inhaler as needed.  J44.9 1 each 0  . SYNTHROID 137 MCG tablet Take 1 tablet (137 mcg total) by mouth daily before breakfast. 90 tablet 2  . valsartan (DIOVAN) 160 MG tablet Take one half tablet (80 mg total) 2 times daily. 90 tablet 1  . vitamin B-12 (CYANOCOBALAMIN) 50 MCG tablet Take 50 mcg by mouth daily.     . Vitamin D, Ergocalciferol, (DRISDOL) 1.25 MG (50000 UT) CAPS capsule Take 1 capsule (50,000 Units total) by mouth every 7 (seven) days. 12 capsule 0   No current facility-administered medications for this visit.     Allergies:   Antihistamines, diphenhydramine-type; Sulfonamide derivatives; Incruse ellipta [umeclidinium bromide];  Clarithromycin; Codeine; Lyrica [pregabalin]; and Spiriva handihaler [tiotropium bromide monohydrate]   Social History:  The patient  reports that she quit smoking about 14 years ago. Her smoking use included cigarettes. She has a 100.00 pack-year smoking history. She has never used smokeless tobacco. She reports current alcohol use. She reports that she does not use drugs.   Family History:   family history includes Cystic fibrosis in her sister; Emphysema in her father; Heart disease in her mother; Hyperthyroidism in her brother and sister; Osteoarthritis in her brother; Thyroid disease in her mother.    Review of Systems: Review of Systems  Constitutional: Negative.   Respiratory: Positive for shortness of breath.   Cardiovascular: Positive for leg swelling.  Gastrointestinal: Negative.   Musculoskeletal: Negative.        Gait instability  Neurological: Negative.   Psychiatric/Behavioral: Negative.   All other systems reviewed and are negative.    PHYSICAL EXAM: VS:  BP 117/68 (BP Location: Left Arm, Patient Position: Sitting, Cuff Size: Normal)   Pulse 76   Ht 5\' 5"  (1.651 m)   Wt 220 lb 6 oz (100 kg)   SpO2 97%   BMI 36.67 kg/m  , BMI Body mass index is 36.67 kg/m. GEN: Well nourished, well developed, in no acute distress, Morbid obese  HEENT: normal  Neck: no JVD, + carotid bruits , no  masses Cardiac: RRR; no murmurs, rubs, or gallops,Trace nonpitting lower extremity  edema Around the ankles Respiratory:  Mildly decreased breath sounds throughout, normal work of breathing GI: soft, nontender, nondistended, + BS MS: no deformity or atrophy  Skin: warm and dry, no rash Neuro:  Strength and sensation are intact Psych: euthymic mood, full affect    Recent Labs: 06/20/2018: ALT 11; Hemoglobin 11.4; Platelets 211.0 07/06/2018: BUN 28; Creatinine, Ser 0.88; Potassium 4.8; Sodium 142 05/02/2019: TSH 18.54    Lipid Panel Lab Results  Component Value Date   CHOL 123  06/20/2018   HDL 49.50 06/20/2018   LDLCALC 53 06/20/2018   TRIG 102.0 06/20/2018      Wt Readings from Last 3 Encounters:  05/23/19 220 lb 6 oz (100 kg)  05/02/19 218 lb (98.9  kg)  03/22/19 215 lb (97.5 kg)       ASSESSMENT AND PLAN:  Dizziness Etiology unclear, orthostatics negative Recommend she hold her valsartan half dose in the morning continue the half dose in the evening and see if symptoms improve -PVCs on EKG, May need to do a ZIO monitor if symptoms persist  Chronic diastolic CHF (congestive heart failure) ([place]) - Plan: EKG 12-Lead No significant swelling left leg, dependent edema right lower extremity Recommended compression hose for right lower extremity Continue Lasix as needed  Essential hypertension - Plan: EKG 12-Lead Blood pressure is well controlled on today's visit. No changes made to the medications.  Lower extremity swelling  Unilateral on the right after trauma to her right leg  Recommend compression hose, leg elevation  COPD exacerbation (HCC) On chronic oxygen , inhalers/Symbicort No recent COPD exacerbation  Shortness of breath Multifactorial with underlying lung disease/COPD, morbid obesity, deconditioning, chronic diastolic CHF Recommend continued slow weight loss through dietary changes, Unable to exercise secondary to hypoxia  Type 2 diabetes mellitus with diabetic neuropathy, without long-term current use of insulin (HCC) Encouraged low carbohydrate diet for weight loss  Morbid obesity ([place])  encourage careful diet management in an effort to lose weight.   Total encounter time more than 25 minutes  Greater than 50% was spent in counseling and coordination of care with the patient   Disposition:   F/U  12 months   No orders of the defined types were placed in this encounter.    Signed, Esmond Plants, M.D., Ph.D. 05/23/2019  [place], [place]

## 2019-05-23 NOTE — Patient Instructions (Addendum)
Medication Instructions:  Hold the 1/2 dose valsartan in the Am for 1 to 2 weeks See if dizzy/lightheaded spells get better   If you need a refill on your cardiac medications before your next appointment, please call your pharmacy.    Lab work: No new labs needed   If you have labs (blood work) drawn today and your tests are completely normal, you will receive your results only by: Marland Kitchen MyChart Message (if you have MyChart) OR . A paper copy in the mail If you have any lab test that is abnormal or we need to change your treatment, we will call you to review the results.   Testing/Procedures: No new testing needed   Follow-Up: At [facility], you and your health needs are our priority.  As part of our continuing mission to provide you with exceptional heart care, we have created designated Provider Care Teams.  These Care Teams include your primary Cardiologist (physician) and Advanced Practice Providers (APPs -  Physician Assistants and Nurse Practitioners) who all work together to provide you with the care you need, when you need it.  . You will need a follow up appointment in 6 months   . Providers on your designated Care Team:   . Murray Hodgkins, NP . Christell Faith, PA-C . Marrianne Mood, PA-C  Any Other Special Instructions Will Be Listed Below (If Applicable).  COVID-19 Vaccine Information can be found at: ShippingScam.co.uk For questions related to vaccine distribution or appointments, please email vaccine@[email or web address] .com or call [phone].

## 2019-05-24 LAB — CBC
Hematocrit: 32.9 % — ABNORMAL LOW (ref 34.0–46.6)
Hemoglobin: 10.7 g/dL — ABNORMAL LOW (ref 11.1–15.9)
MCH: 31 pg (ref 26.6–33.0)
MCHC: 32.5 g/dL (ref 31.5–35.7)
MCV: 95 fL (ref 79–97)
Platelets: 180 10*3/uL (ref 150–450)
RBC: 3.45 x10E6/uL — ABNORMAL LOW (ref 3.77–5.28)
RDW: 12.8 % (ref 11.7–15.4)
WBC: 5.9 10*3/uL (ref 3.4–10.8)

## 2019-05-28 ENCOUNTER — Telehealth: Payer: Self-pay

## 2019-05-28 NOTE — Telephone Encounter (Signed)
Had prev worked on this.  Staff message prev sent to get patient scheduled for follow-up regarding anemia.  That was done over the weekend.  If not already scheduled, then please schedule 72min OV.  Thanks.

## 2019-05-28 NOTE — Telephone Encounter (Signed)
Patient has been scheduled.  Thank you.

## 2019-05-28 NOTE — Telephone Encounter (Signed)
Patient contacted the office and stated that the nurse from Dr. Donivan Scull office contacted her and states that she needed to make sure that Dr. [name] was aware and had taken a look at her most recent labs from 05/23/19. I see the results in Epic. I will route to Dr. [name] so he can be aware and take a look at these labs, and I advised patient we would call if any recommendations. Patient verbalized understanding.

## 2019-06-04 ENCOUNTER — Encounter: Payer: Self-pay | Admitting: Family Medicine

## 2019-06-04 ENCOUNTER — Ambulatory Visit (INDEPENDENT_AMBULATORY_CARE_PROVIDER_SITE_OTHER): Payer: Medicare Other | Admitting: Family Medicine

## 2019-06-04 ENCOUNTER — Other Ambulatory Visit: Payer: Self-pay

## 2019-06-04 ENCOUNTER — Other Ambulatory Visit: Payer: Self-pay | Admitting: Family Medicine

## 2019-06-04 VITALS — BP 126/70 | HR 61 | Temp 96.9°F | Ht 65.0 in | Wt 214.6 lb

## 2019-06-04 DIAGNOSIS — R140 Abdominal distension (gaseous): Secondary | ICD-10-CM | POA: Diagnosis not present

## 2019-06-04 DIAGNOSIS — D539 Nutritional anemia, unspecified: Secondary | ICD-10-CM

## 2019-06-04 DIAGNOSIS — Z1211 Encounter for screening for malignant neoplasm of colon: Secondary | ICD-10-CM

## 2019-06-04 DIAGNOSIS — G629 Polyneuropathy, unspecified: Secondary | ICD-10-CM | POA: Diagnosis not present

## 2019-06-04 DIAGNOSIS — D649 Anemia, unspecified: Secondary | ICD-10-CM

## 2019-06-04 LAB — IBC PANEL
Iron: 53 ug/dL (ref 42–145)
Saturation Ratios: 14.9 % — ABNORMAL LOW (ref 20.0–50.0)
Transferrin: 254 mg/dL (ref 212.0–360.0)

## 2019-06-04 LAB — CBC WITH DIFFERENTIAL/PLATELET
Basophils Absolute: 0 10*3/uL (ref 0.0–0.1)
Basophils Relative: 0.6 % (ref 0.0–3.0)
Eosinophils Absolute: 0.1 10*3/uL (ref 0.0–0.7)
Eosinophils Relative: 2.1 % (ref 0.0–5.0)
HCT: 35 % — ABNORMAL LOW (ref 36.0–46.0)
Hemoglobin: 11.6 g/dL — ABNORMAL LOW (ref 12.0–15.0)
Lymphocytes Relative: 21.2 % (ref 12.0–46.0)
Lymphs Abs: 1.4 10*3/uL (ref 0.7–4.0)
MCHC: 33.2 g/dL (ref 30.0–36.0)
MCV: 96.7 fl (ref 78.0–100.0)
Monocytes Absolute: 0.6 10*3/uL (ref 0.1–1.0)
Monocytes Relative: 8.4 % (ref 3.0–12.0)
Neutro Abs: 4.6 10*3/uL (ref 1.4–7.7)
Neutrophils Relative %: 67.7 % (ref 43.0–77.0)
Platelets: 204 10*3/uL (ref 150.0–400.0)
RBC: 3.62 Mil/uL — ABNORMAL LOW (ref 3.87–5.11)
RDW: 14 % (ref 11.5–15.5)
WBC: 6.8 10*3/uL (ref 4.0–10.5)

## 2019-06-04 LAB — COMPREHENSIVE METABOLIC PANEL
ALT: 11 U/L (ref 0–35)
AST: 13 U/L (ref 0–37)
Albumin: 4 g/dL (ref 3.5–5.2)
Alkaline Phosphatase: 71 U/L (ref 39–117)
BUN: 24 mg/dL — ABNORMAL HIGH (ref 6–23)
CO2: 31 mEq/L (ref 19–32)
Calcium: 8.9 mg/dL (ref 8.4–10.5)
Chloride: 108 mEq/L (ref 96–112)
Creatinine, Ser: 0.88 mg/dL (ref 0.40–1.20)
GFR: 61.56 mL/min (ref >60.00)
Glucose, Bld: 117 mg/dL — ABNORMAL HIGH (ref 70–99)
Potassium: 4.4 mEq/L (ref 3.5–5.1)
Sodium: 142 mEq/L (ref 135–145)
Total Bilirubin: 0.4 mg/dL (ref 0.2–1.2)
Total Protein: 6.4 g/dL (ref 6.0–8.3)

## 2019-06-04 LAB — POC URINALSYSI DIPSTICK (AUTOMATED)
Bilirubin, UA: NEGATIVE
Glucose, UA: NEGATIVE
Ketones, UA: NEGATIVE
Leukocytes, UA: NEGATIVE
Nitrite, UA: NEGATIVE
Protein, UA: NEGATIVE
Spec Grav, UA: >=1.03 — AB (ref 1.010–1.025)
Urobilinogen, UA: 0.2 E.U./dL
pH, UA: 6 (ref 5.0–8.0)

## 2019-06-04 LAB — VITAMIN B12: Vitamin B-12: >1500 pg/mL — ABNORMAL HIGH (ref 211–911)

## 2019-06-04 LAB — LIPASE: Lipase: 49 U/L (ref 11.0–59.0)

## 2019-06-04 MED ORDER — VALSARTAN 160 MG PO TABS: ORAL_TABLET | ORAL | Status: DC

## 2019-06-04 MED ORDER — VITAMIN B-12 50 MCG PO TABS: 50.0000 ug | ORAL_TABLET | Freq: Every day | ORAL | Status: DC

## 2019-06-04 NOTE — Patient Instructions (Signed)
Go to the lab on the way out.   If you have mychart we'll likely use that to update you.    ?Take care.  Glad to see you. ?Don't change your meds for now.  ?

## 2019-06-04 NOTE — Progress Notes (Signed)
This visit occurred during the SARS-CoV-2 public health emergency.  Safety protocols were in place, including screening questions prior to the visit, additional usage of staff PPE, and extensive cleaning of exam room while observing appropriate contact time as indicated for disinfecting solutions.  Anemia.  Gradual change in HGB noted.   WBC and platelets are still normal.  She has been lightheaded and cut back on valsartan. She hasn't been lightheaded for the last 2 days.  No blood in stool.  No black stools unless she had a lot of chocolate cake.  No tarry stools.  No vomiting, not vomiting blood.  She has some persistent abd bloating and "feels hot" inside, worse in the last 6 months when she stopped [place].  She has h/o hernia repair.   She is already on B12 and iron orally.  Not on aspirin or blood thinners.    H/o HH on EGD 2017. She had colonoscopy 2017.  - Diverticulosis in the left colon. - Medium-sized pedunculated lipoma in the sigmoid colon. - Internal hemorrhoids.  ROS: Per HPI unless specifically indicated in ROS section   Meds, vitals, and allergies reviewed.   GEN: nad, alert and oriented HEENT: ncat NECK: supple w/o LA CV: rrr.  PULM: ctab, no inc wob ABD: soft, +bs, abd ttp with deep palpation w/o rebound.  EXT: no edema SKIN: No jaundice, well perfused.

## 2019-06-05 LAB — PATHOLOGIST SMEAR REVIEW

## 2019-06-06 ENCOUNTER — Other Ambulatory Visit: Payer: Self-pay | Admitting: Family Medicine

## 2019-06-06 DIAGNOSIS — R8290 Unspecified abnormal findings in urine: Secondary | ICD-10-CM

## 2019-06-06 NOTE — Assessment & Plan Note (Signed)
Differential diagnosis discussed with patient, with peripheral consumption, loss, or decreased production being possible causes.  She is abdominal bloating. Unclear if bloating is related to metformin.    Reasonable to recheck routine labs today.  See notes on labs.  No change in meds at this point.  Still okay for outpatient follow-up. At least 30 minutes were devoted to patient care in this encounter (this can potentially include time spent reviewing the patient's file/history, interviewing and examining the patient, counseling/reviewing plan with patient, ordering referrals, ordering tests, reviewing relevant laboratory or x-ray data, and documenting the encounter).

## 2019-06-12 ENCOUNTER — Other Ambulatory Visit: Payer: Self-pay | Admitting: Family Medicine

## 2019-06-13 ENCOUNTER — Ambulatory Visit
Admission: RE | Admit: 2019-06-13 | Discharge: 2019-06-13 | Disposition: A | Discharge status: Home / Self Care | Payer: Medicare Other | Source: Ambulatory Visit | Attending: Pulmonary Disease | Admitting: Pulmonary Disease

## 2019-06-13 DIAGNOSIS — R918 Other nonspecific abnormal finding of lung field: Secondary | ICD-10-CM | POA: Diagnosis not present

## 2019-06-13 DIAGNOSIS — R911 Solitary pulmonary nodule: Secondary | ICD-10-CM

## 2019-06-14 ENCOUNTER — Other Ambulatory Visit: Payer: Self-pay | Admitting: Family Medicine

## 2019-06-14 DIAGNOSIS — E1140 Type 2 diabetes mellitus with diabetic neuropathy, unspecified: Secondary | ICD-10-CM

## 2019-06-20 ENCOUNTER — Telehealth: Payer: Self-pay | Admitting: Pulmonary Disease

## 2019-06-20 NOTE — Telephone Encounter (Signed)
Dr. Halford Chessman, can you review CT?

## 2019-06-21 NOTE — Telephone Encounter (Signed)
Called and spoke with pt's daughter Lavella Lemons letting her know the info stated by VS. Stated to her to make sure pt keeps appt on 4/20 as the CT will be discussed in detail at that appt. [name] verbalized understanding. Nothing further needed.

## 2019-06-21 NOTE — Telephone Encounter (Signed)
She has appt on 06/26/19.  Was planning to discuss then.  Rt upper lobe nodule has some growth compared to October 2020.  She needs to keep appointment on 06/26/19 to discuss options in more detail.

## 2019-06-26 ENCOUNTER — Ambulatory Visit (INDEPENDENT_AMBULATORY_CARE_PROVIDER_SITE_OTHER): Payer: Medicare Other | Admitting: Pulmonary Disease

## 2019-06-26 ENCOUNTER — Telehealth: Payer: Self-pay | Admitting: *Deleted

## 2019-06-26 ENCOUNTER — Other Ambulatory Visit: Payer: Self-pay

## 2019-06-26 ENCOUNTER — Encounter: Payer: Self-pay | Admitting: Pulmonary Disease

## 2019-06-26 ENCOUNTER — Telehealth: Payer: Self-pay

## 2019-06-26 ENCOUNTER — Other Ambulatory Visit (INDEPENDENT_AMBULATORY_CARE_PROVIDER_SITE_OTHER): Payer: Medicare Other

## 2019-06-26 ENCOUNTER — Ambulatory Visit: Payer: Medicare Other

## 2019-06-26 VITALS — BP 122/78 | HR 80 | Ht 65.0 in | Wt 218.6 lb

## 2019-06-26 DIAGNOSIS — J449 Chronic obstructive pulmonary disease, unspecified: Secondary | ICD-10-CM

## 2019-06-26 DIAGNOSIS — J432 Centrilobular emphysema: Secondary | ICD-10-CM

## 2019-06-26 DIAGNOSIS — E1140 Type 2 diabetes mellitus with diabetic neuropathy, unspecified: Secondary | ICD-10-CM

## 2019-06-26 DIAGNOSIS — R8290 Unspecified abnormal findings in urine: Secondary | ICD-10-CM | POA: Diagnosis not present

## 2019-06-26 DIAGNOSIS — R911 Solitary pulmonary nodule: Secondary | ICD-10-CM

## 2019-06-26 DIAGNOSIS — J9611 Chronic respiratory failure with hypoxia: Secondary | ICD-10-CM | POA: Diagnosis not present

## 2019-06-26 DIAGNOSIS — R918 Other nonspecific abnormal finding of lung field: Secondary | ICD-10-CM

## 2019-06-26 LAB — URINALYSIS, ROUTINE W REFLEX MICROSCOPIC
Bilirubin Urine: NEGATIVE
Ketones, ur: NEGATIVE
Nitrite: NEGATIVE
Specific Gravity, Urine: 1.025 (ref 1.000–1.030)
Total Protein, Urine: NEGATIVE
Urine Glucose: NEGATIVE
Urobilinogen, UA: 0.2 (ref 0.0–1.0)
pH: 6 (ref 5.0–8.0)

## 2019-06-26 LAB — LIPID PANEL
Cholesterol: 148 mg/dL (ref 0–200)
HDL: 60 mg/dL (ref >39.00)
LDL Cholesterol: 69 mg/dL (ref 0–99)
NonHDL: 88.02
Total CHOL/HDL Ratio: 2
Triglycerides: 93 mg/dL (ref 0.0–149.0)
VLDL: 18.6 mg/dL (ref 0.0–40.0)

## 2019-06-26 NOTE — Telephone Encounter (Signed)
Oncology Nurse Navigator Documentation  Oncology Nurse Navigator Flowsheets 06/26/2019  Navigator Location [facility]  Referral Date to RadOnc/MedOnc 06/26/2019  Navigator Encounter Type Telephone/I received referral today on Alexandria [name]. I called to scheduler her to be seen with Dr. Julien Nordmann but was unable to reach her. I did leave vm message for her to call me with my name and phone number.   Telephone Outgoing Call  Treatment Phase Abnormal Scans  Barriers/Navigation Needs Coordination of Care;Education  Education Other  Interventions Coordination of Care;Education  Acuity Level 2-Minimal Needs (1-2 Barriers Identified)  Education Method Verbal  Time Spent with Patient 15

## 2019-06-26 NOTE — Addendum Note (Signed)
Addended by: Cloyd Stagers on: 06/26/2019 09:43 AM   Modules accepted: Orders

## 2019-06-26 NOTE — Progress Notes (Signed)
[facility]  Chief Complaint  Patient presents with  . Follow-up    Pt states she has been doing okay since last visit. States she was having issues with dizziness for about 2 months and pt's valsartan dose was cut in half which that has helped to subside her symptoms.    Constitutional:  BP 122/78 (BP Location: Left Arm, Patient Position: Sitting, Cuff Size: Large)   Pulse 80   Ht 5\' 5"  (1.651 m)   Wt 218 lb 9.6 oz (99.2 kg)   SpO2 98% Comment: 2 pulse  BMI 36.38 kg/m   Past Medical History:  Depression, Hypothyroidism, HTN, Diastolic CHF, HLD, DM, GERD, Peripheral neuropathy  Brief Summary:  Alexandria [name] is a 84 y.o. female with dyspnea from COPD, diastolic CHF, deconditioning, and iron deficiency anemia.  She also has hx of OSA, but intolerant of CPAP.  Subjective:  She is here with her daughter.  Had f/u CT chest on 06/13/19.  Showed increased size of Rt upper lobe nodule.  Respiratory symptoms stable.   Remains on 2 liters oxygen.  She did have d/w Dr. Valeta Harms in 2019 about biopsy procedures, but was very scared by his description of what would happen.  Physical Exam:   Appearance - well kempt   ENMT - no sinus tenderness, no oral exudate, no LAN, no stridor  Respiratory - equal breath sounds bilaterally, no wheezing or rales  CV - s1s2 regular rate and rhythm, no murmurs  Ext - no clubbing, no edema  Skin - no rashes  Psych - normal mood and affect  Assessment/Plan:   Rt upper lung nodule. - slowly progressive increase in size since 2018 - discussed concern is for slow growing adenocarcinoma - options at this time discussed are: 1) revisit bronchoscopy with ENB, 2) refer to [employer] to discuss option of radiation therapy w/o tissue confirmation of diagnosis, 3) clinical option - she doesn't want to take the risk of bronchoscopy and ENB in relation to anesthesia risk and pneumothorax risk - she would be willing to  consider empiric radiation therapy - will therefore refer her case to [facility] for review to determine if she is a candidate for empiric radiation therapy  COPD with emphysema and chronic bronchitis. - continue symbicort, prn albuterol  Chronic respiratory failure with hypoxia. - continue 2 liters oxygen 24/7  A total of  48 minutes spent addressing patient care issues on day of visit.   Follow up:   Patient Instructions  Will arrange for referral to multidisciplinary thoracic oncology conference [facility])  Follow up in 4 weeks   Signature:  Chesley Mires, MD [place] Pager: [phone] 06/26/2019, 12:48 PM  Flow Sheet     Pulmonary tests:  RAST 10/02/10 >> negative, IgE 8.1 A1AT 04/15/11 >> MM PFT 10/512 >> FEV1 1.29 (61%), FEV1% 57, TLC 6.01 (119%), RV 3.52 (153%), DLCO 67%, no BD PFT 08/15/14 >> FEV1 1.07 (57%), FEV1% 55, TLC 6.49 (128%), DLCO 47%, no BD  Chest Imaging:  CT chest 06/29/16 >> atherosclerosis, RUL nodule 7 mm (was 8 mm), no change 6 mm LUL nodule, no change 4 mm RLL nodule  CT chest 12/23/17 >> 10 mm RUL nodule, 7 mm RUL nodule, 4 mm RLL nodule, 6 mm LUL nodule, centrilobular emphysema PET scan 01/06/18 >> RUL nodule not hypermetabolic CT chest 9/32/35 >> atherosclerosis, moderate centrilobular emphysema with diffuse bronchial wall thickening, 2.4 cm posterior apical irregular nodule increased from 2.3 cm, scar medial  RLL, scattered nodules up to 4 mm in medial RLL, 6 mm nodule in LLL CT chest 12/14/18 >> no change RUL nodule CT chest 06/13/19 >> 2.2 x 1.2 cm RUL nodule (was 2.1 x 1.0 cm from October 2020), 0.4 cm nodule LLL  Sleep tests:  PSG 01/27/10 >> AHI 13, SpO2 low 73% ONO with CPAP 08/09/14 >> Test time 9 hr 16 min. Mean SpO2 92.2%, low SpO2 86%. Spent 10 min with SpO2 < 88% ONO with CPAP 06/24/16 >>test time 7 hrs 29 min. Average SpO2 90%, low SpO2 79%. Spent 58 min with SpO2 <88%. ONO with RA 11/01/16 >> test time 6 hrs 43 min.   Average SpO2 89%, low SpO2 67%.  Spent 2 hrs 7 min with SpO2 < 88%. ONO with 2 liters 07/18/18 >> test time 8 hrs 34 min.  Baseline SpO2 94%, low SpO2 69%.  Spent 38 min with SpO2 < 88%.   Cardiac tests:  Echo 09/28/14 >> mild LVH, EF 55 to 60%  Medications:   Allergies as of 06/26/2019      Reactions   Antihistamines, Diphenhydramine-type Other (See Comments)   Able to tolerate only allegra.    Sulfonamide Derivatives Swelling   REACTION: closed throat   Incruse Ellipta [umeclidinium Bromide] Cough   Caused voice change and severe coughing   Clarithromycin Other (See Comments)   REACTION: diff swallowing and mouth blisters   Codeine Other (See Comments)   Unknown    Lyrica [pregabalin] Other (See Comments)   Lack of effect for neuropathy pain.     Spiriva Handihaler [tiotropium Bromide Monohydrate] Other (See Comments)   Voice changes      Medication List       Accurate as of June 26, 2019 12:48 PM. If you have any questions, ask your nurse or doctor.        albuterol 108 (90 Base) MCG/ACT inhaler Commonly known as: ProAir HFA Inhale 2 puffs into the lungs every 6 (six) hours as needed for wheezing or shortness of breath. INHALE 2 PUFFS INTO THE LUNGS EVERY 6 HOURS AS NEEDED FOR WHEEZING OR SHORTNESS OF BREATH   Azelastine-Fluticasone 137-50 MCG/ACT Susp Place 2 puffs into the nose 1 day or 1 dose for 1 dose.   budesonide-formoterol 160-4.5 MCG/ACT inhaler Commonly known as: Symbicort Inhale 2 puffs into the lungs 2 (two) times daily.   busPIRone 5 MG tablet Commonly known as: BUSPAR As needed   Coenzyme Q-10 200 MG Caps Take 200 mg by mouth daily.   EpiPen 0.3 mg/0.3 mL Devi Generic drug: EPINEPHrine Inject 0.3 mg into the muscle once as needed (anaphylaxis).   esomeprazole 40 MG capsule Commonly known as: NEXIUM Take 40 mg by mouth every other day.   ezetimibe 10 MG tablet Commonly known as: ZETIA TAKE 1 TABLET BY MOUTH EVERYDAY AT BEDTIME   ferrous  sulfate 325 (65 FE) MG EC tablet Take 1 tablet (325 mg total) by mouth daily with breakfast.   fexofenadine 180 MG tablet Commonly known as: ALLEGRA Take 180 mg by mouth as needed.   FLUoxetine 20 MG capsule Commonly known as: PROZAC Take 20 mg by mouth 2 (two) times daily.   fluticasone 50 MCG/ACT nasal spray Commonly known as: FLONASE USE 2 SPRAYS INTO THE NOSE EVERY 12 HOURS AS NEEDED FOR STUFFY NOSE   furosemide 20 MG tablet Commonly known as: LASIX Take 1 tablet (20 mg total) by mouth daily as needed for fluid.   gabapentin 100 MG capsule Commonly  known as: NEURONTIN Take 1-2 capsules (100-200 mg total) by mouth 2 (two) times daily as needed. First 5 days take 100mg  at night   glucose blood test strip Commonly known as: OneTouch Verio Use as instructed to check sugar 2-3x time daily   OneTouch Verio test strip Generic drug: glucose blood USE AS INSTRUCTED TO CHECK SUGAR 1 TIME DAILY   ibuprofen 200 MG tablet Commonly known as: ADVIL Take 200-400 mg by mouth every 6 (six) hours as needed for moderate pain.   metFORMIN 500 MG tablet Commonly known as: GLUCOPHAGE Take 1 tablet (500 mg total) by mouth daily with supper.   nitroGLYCERIN 0.4 MG SL tablet Commonly known as: NITROSTAT Place 1 tablet (0.4 mg total) under the tongue every 5 (five) minutes as needed for chest pain.   OneTouch Delica Lancets Fine Misc USE TO CHECK SUGAR 1 TIME DAILY   potassium chloride 10 MEQ tablet Commonly known as: KLOR-CON Take 1 tablet (10 mEq total) by mouth daily as needed (Takes with furosemide doses.).   rosuvastatin 10 MG tablet Commonly known as: CRESTOR TAKE 1 TABLET (10 MG TOTAL) BY MOUTH EVERY OTHER DAY.   Spacer/Aero Chamber [employer] Use with  inhaler as needed.  J44.9   Synthroid 137 MCG tablet Generic drug: levothyroxine Take 1 tablet (137 mcg total) by mouth daily before breakfast.   valsartan 160 MG tablet Commonly known as: DIOVAN TAKE ONE HALF  TABLET (80 MG TOTAL) 2 TIMES DAILY.   vitamin B-12 50 MCG tablet Commonly known as: CYANOCOBALAMIN Take 1 tablet (50 mcg total) by mouth daily.   Vitamin D 50 MCG (2000 UT) tablet Take 1 tablet (2,000 Units total) by mouth daily.       Past Surgical History:  She  has a past surgical history that includes Tonsillectomy; Rotator cuff repair (1984); Cesarean section; Total abdominal hysterectomy (1985); Cholecystectomy (1997); THUMB RELEASE (12/1997); Carpal tunnel release (12/1997); Esophagogastroduodenoscopy (12/05/1997); ABD U/S (03/19/1999); CT ABD W & PELVIS WO/W CM; CARDIOLITE PERSANTINE (08/24/2000); CAROTID U/S (08/24/2000); DEXA (07/03/2003); ADENOSINE MYOVIEW (06/02/2007); CAROTID U/S (06/02/2007); US ECHOCARDIOGRAPHY (06/02/2007); Hernia repair (01/24/2009); knee arthroscopic surgery (years ago); Shoulder open rotator cuff repair (02/08/2012); Gallbladder surgery; Dental surgery (2016); Colonoscopy with propofol (N/A, 02/12/2016); and Esophagogastroduodenoscopy (egd) with propofol (N/A, 02/12/2016).  Family History:  Her family history includes Cystic fibrosis in her sister; Emphysema in her father; Heart disease in her mother; Hyperthyroidism in her brother and sister; Osteoarthritis in her brother; Thyroid disease in her mother.  Social History:  She  reports that she quit smoking about 14 years ago. Her smoking use included cigarettes. She has a 100.00 pack-year smoking history. She has never used smokeless tobacco. She reports current alcohol use. She reports that she does not use drugs.

## 2019-06-26 NOTE — Telephone Encounter (Signed)
FYI- Called patient to complete her Medicare visit. Patient stated that she was on her way to a doctors appointment and does not have time to complete this visit. Patient will call back later to reschedule. Appointment was cancelled.

## 2019-06-26 NOTE — Patient Instructions (Signed)
Will arrange for referral to multidisciplinary thoracic oncology conference [facility])  Follow up in 4 weeks

## 2019-06-26 NOTE — Telephone Encounter (Signed)
Oncology Nurse Navigator Documentation  Oncology Nurse Navigator Flowsheets 06/26/2019  Navigator Location [facility]  Referral Date to RadOnc/MedOnc -  Navigator Encounter Type Telephone/I received a call back from patient and daughter.  I updated on appt at [facility] this week.  She verbalized understanding of appt time and place.   Telephone Outgoing Call  Treatment Phase Abnormal Scans  Barriers/Navigation Needs Coordination of Care;Education  Education Other  Interventions Coordination of Care;Education  Acuity Level 2-Minimal Needs (1-2 Barriers Identified)  Coordination of Care Appts  Education Method Verbal  Time Spent with Patient 33

## 2019-06-28 ENCOUNTER — Other Ambulatory Visit: Payer: Self-pay | Admitting: *Deleted

## 2019-06-28 ENCOUNTER — Encounter: Payer: Self-pay | Admitting: Radiation Oncology

## 2019-06-28 ENCOUNTER — Telehealth: Payer: Self-pay | Admitting: *Deleted

## 2019-06-28 ENCOUNTER — Other Ambulatory Visit: Payer: Medicare Other

## 2019-06-28 ENCOUNTER — Inpatient Hospital Stay: Payer: Medicare Other | Admitting: Internal Medicine

## 2019-06-28 ENCOUNTER — Inpatient Hospital Stay: Payer: Medicare Other

## 2019-06-28 NOTE — Progress Notes (Signed)
The patient's case was discussed in multidisciplinary thoracic conference this morning.  The patient has some suspicious findings in terms of pulmonary nodules within the right lung and also within the left lung.  Due to comorbidities, the patient is not a good candidate for surgery and also likely not for biopsy either.  We discussed proceeding with a PET scan, potentially 3 pulmonary medicine who has seen the patient thus far, and then proceeding with stereotactic body radiation treatment if the suspicious areas are hypermetabolic and no other significant findings are found.  Depending on her PET scan results, we would be happy to see the patient in the near future to consider stereotactic body radiation treatment as appropriate.  ------------------------------------------------  Jodelle Gross, MD, PhD

## 2019-06-28 NOTE — Telephone Encounter (Signed)
I called patient and updated her regarding cancer conference discussion.  I explained that Dr. Halford Chessman will order PET and refer to Rad Onc.  She verbalized understanding but would like an email regarding my phone number. I will complete.

## 2019-06-28 NOTE — Telephone Encounter (Signed)
Oncology Nurse Navigator Documentation  Oncology Nurse Navigator Flowsheets 06/28/2019  Navigator Location [facility]  Referral Date to RadOnc/MedOnc -  Navigator Encounter Type Telephone/I received a call from patient's daughter.  I called her back but was unable to reach her.  I left vm message.   Telephone Incoming Call;Outgoing Call  Treatment Phase Abnormal Scans  Barriers/Navigation Needs Education  Education Other  Interventions Education  Acuity Level 2-Minimal Needs (1-2 Barriers Identified)  Coordination of Care -  Education Method Verbal  Time Spent with Patient 15

## 2019-06-28 NOTE — Progress Notes (Signed)
The proposed treatment discussed in cancer conference 06/28/19 is for discussion purpose only and is not a binding recommendation.  The patient was not physically examined nor present for their treatment options.  Therefore, final treatment plans cannot be decided.

## 2019-07-02 ENCOUNTER — Encounter: Payer: Self-pay | Admitting: *Deleted

## 2019-07-02 DIAGNOSIS — R918 Other nonspecific abnormal finding of lung field: Secondary | ICD-10-CM

## 2019-07-02 NOTE — Progress Notes (Signed)
Oncology Nurse Navigator Documentation  Oncology Nurse Navigator Flowsheets 07/02/2019  Navigator Location [facility]  Referral Date to RadOnc/MedOnc -  Navigator Encounter Type Other/I followed up on Alexandria [name]'s PET and referral to rad onc.  I did not see them ordered. I reached out to Dr. Valeta Harms.  Scan and referral are ordered per Dr. Valeta Harms.   Telephone -  Treatment Phase Abnormal Scans  Barriers/Navigation Needs Coordination of Care  Education -  Interventions Coordination of Care  Acuity Level 2-Minimal Needs (1-2 Barriers Identified)  Coordination of Care Other  Education Method -  Time Spent with Patient 15

## 2019-07-03 ENCOUNTER — Ambulatory Visit: Payer: Medicare Other

## 2019-07-03 ENCOUNTER — Other Ambulatory Visit: Payer: Medicare Other

## 2019-07-04 ENCOUNTER — Encounter: Payer: Self-pay | Admitting: *Deleted

## 2019-07-04 ENCOUNTER — Telehealth: Payer: Self-pay | Admitting: *Deleted

## 2019-07-04 NOTE — Telephone Encounter (Signed)
Oncology Nurse Navigator Documentation  Oncology Nurse Navigator Flowsheets 07/04/2019  Navigator Location [facility]  Referral Date to RadOnc/MedOnc -  Navigator Encounter Type MyChart;Telephone  Telephone Outgoing Call/I received a mychart message stating that [name] has called several times and unable to get through. She would like to re-scheduled appt with Dr. Lisbeth Renshaw and have her daughter to be notified.  I contacted Rad Onc scheduling to call and re-schedule. I called the daughter with an update. I did ask about PET scan.  This scan is still not authorized and I asked the daughter if she would like me to call Dr. Valeta Harms and see if he can help get authed.  She will talk with her mom and then let me know if she would like me to proceed with calling Dr. Valeta Harms.  Daughter was thankful for the call and update.   Treatment Phase Abnormal Scans  Barriers/Navigation Needs Coordination of Care;Education  Education Other  Interventions Coordination of Care;Education;Psycho-Social Support  Acuity Level 2-Minimal Needs (1-2 Barriers Identified)  Coordination of Care Other  Education Method Verbal  Time Spent with Patient 30

## 2019-07-05 ENCOUNTER — Telehealth: Payer: Self-pay | Admitting: Family Medicine

## 2019-07-05 DIAGNOSIS — Z1211 Encounter for screening for malignant neoplasm of colon: Secondary | ICD-10-CM

## 2019-07-05 LAB — TIQ-MISC

## 2019-07-05 NOTE — Telephone Encounter (Signed)
Thanks

## 2019-07-05 NOTE — Telephone Encounter (Signed)
Update received from lab.  Patient apparently dropped off fecal occult blood test kit but this was placed in the after hour drop box for [facility], not our lab.  Patient will need to repeat collection.  New order placed.  Please contact patient about recollection.  Thanks.

## 2019-07-05 NOTE — Telephone Encounter (Signed)
Called patient to confirm IFOB placement- pt stated we were closed and she put it in the [facility] box.  I offered mailing a new kit or giving one at her upcoming appt- pt chose mail option.  I have put a new kit int the mail and instructed pt to return during clinic hours to the inside specimen box.  

## 2019-07-10 ENCOUNTER — Encounter: Payer: Medicare Other | Admitting: Family Medicine

## 2019-07-12 ENCOUNTER — Ambulatory Visit: Payer: PRIVATE HEALTH INSURANCE

## 2019-07-12 ENCOUNTER — Ambulatory Visit: Payer: PRIVATE HEALTH INSURANCE | Admitting: Radiation Oncology

## 2019-07-12 LAB — HOUSE ACCOUNT TRACKING

## 2019-07-16 ENCOUNTER — Telehealth: Payer: Self-pay | Admitting: *Deleted

## 2019-07-16 NOTE — Telephone Encounter (Signed)
Oncology Nurse Navigator Documentation  Oncology Nurse Navigator Flowsheets 07/16/2019  Navigator Location [facility]  Referral Date to RadOnc/MedOnc -  Navigator Encounter Type Telephone/I received a call from patient's daughter.  She is frustrated that she needs to change the appt with Dr. Lisbeth Renshaw.  I spoke with Rad Onc, Rhonda and rescheduled the appt. I called daughter.  She states "we don't work like that".  I listened as she was disrespectful and rude.  She wants to call Rhonda and re-schedule. I gave her the phone number and Rhonda's number.   Telephone Incoming Call;Outgoing Call  Treatment Phase Abnormal Scans  Barriers/Navigation Needs Coordination of Care;Education  Education Other  Interventions Coordination of Care;Education  Acuity Level 2-Minimal Needs (1-2 Barriers Identified)  Coordination of Care Appts  Education Method Verbal  Time Spent with Patient 45

## 2019-07-17 ENCOUNTER — Telehealth: Payer: Self-pay | Admitting: Radiology

## 2019-07-17 ENCOUNTER — Other Ambulatory Visit (INDEPENDENT_AMBULATORY_CARE_PROVIDER_SITE_OTHER): Payer: Medicare Other

## 2019-07-17 DIAGNOSIS — Z1211 Encounter for screening for malignant neoplasm of colon: Secondary | ICD-10-CM

## 2019-07-17 LAB — FECAL OCCULT BLOOD, IMMUNOCHEMICAL: Fecal Occult Bld: POSITIVE — AB

## 2019-07-17 NOTE — Telephone Encounter (Signed)
[facility] called a POSITIVE ifob, results given to [name]

## 2019-07-18 ENCOUNTER — Institutional Professional Consult (permissible substitution): Payer: PRIVATE HEALTH INSURANCE | Admitting: Radiation Oncology

## 2019-07-18 ENCOUNTER — Ambulatory Visit (HOSPITAL_COMMUNITY)
Admission: RE | Admit: 2019-07-18 | Discharge: 2019-07-18 | Disposition: A | Discharge status: Home / Self Care | Payer: Medicare Other | Source: Ambulatory Visit | Attending: Pulmonary Disease | Admitting: Pulmonary Disease

## 2019-07-18 ENCOUNTER — Other Ambulatory Visit: Payer: Self-pay

## 2019-07-18 DIAGNOSIS — I700 Atherosclerosis of aorta: Secondary | ICD-10-CM | POA: Diagnosis not present

## 2019-07-18 DIAGNOSIS — I2510 Atherosclerotic heart disease of native coronary artery without angina pectoris: Secondary | ICD-10-CM | POA: Diagnosis not present

## 2019-07-18 DIAGNOSIS — R918 Other nonspecific abnormal finding of lung field: Secondary | ICD-10-CM | POA: Diagnosis not present

## 2019-07-18 LAB — GLUCOSE, CAPILLARY: Glucose-Capillary: 105 mg/dL — ABNORMAL HIGH (ref 70–99)

## 2019-07-18 MED ORDER — FLUDEOXYGLUCOSE F - 18 (FDG) INJECTION
10.2000 | Freq: Once | INTRAVENOUS | Status: AC | PRN
  Administered 2019-07-18: 10.2 via INTRAVENOUS

## 2019-07-18 NOTE — Telephone Encounter (Signed)
I did EGD and colonoscopy 02/2016 for IDA.  Not sure she needs these repeated but I'm certainly happy to talk with her in the office about her labs, FOBT stool.  Patty, Can you call her, offer my next available OV.  Thanks

## 2019-07-18 NOTE — Telephone Encounter (Signed)
Please call patient.  Fecal occult blood test is positive.  I am also going to route this note to the GI clinic for input.  Given her other medical conditions and her age I do not know if she would be a candidate for follow-up endoscopy but given her history of anemia I would like GI input.  I appreciate the help of all involved.  Thanks.

## 2019-07-18 NOTE — Telephone Encounter (Signed)
08/22/19 at 930 am appt with Dr Ardis Hughs scheduled.  Left message on machine to call back

## 2019-07-18 NOTE — Telephone Encounter (Signed)
Left message on machine to call back  

## 2019-07-18 NOTE — Telephone Encounter (Signed)
I thank all involved.

## 2019-07-18 NOTE — Telephone Encounter (Signed)
Spoke to pt. Advised her if she does not hear from GI or our office in a week, to call us back.

## 2019-07-19 ENCOUNTER — Ambulatory Visit (INDEPENDENT_AMBULATORY_CARE_PROVIDER_SITE_OTHER): Payer: Medicare Other | Admitting: Family Medicine

## 2019-07-19 ENCOUNTER — Encounter: Payer: Self-pay | Admitting: Family Medicine

## 2019-07-19 VITALS — BP 142/58 | HR 78 | Temp 97.3°F | Ht 65.0 in | Wt 221.1 lb

## 2019-07-19 DIAGNOSIS — D509 Iron deficiency anemia, unspecified: Secondary | ICD-10-CM

## 2019-07-19 DIAGNOSIS — R918 Other nonspecific abnormal finding of lung field: Secondary | ICD-10-CM | POA: Diagnosis not present

## 2019-07-19 DIAGNOSIS — I2510 Atherosclerotic heart disease of native coronary artery without angina pectoris: Secondary | ICD-10-CM

## 2019-07-19 DIAGNOSIS — D649 Anemia, unspecified: Secondary | ICD-10-CM

## 2019-07-19 LAB — CBC WITH DIFFERENTIAL/PLATELET
Basophils Absolute: 0 10*3/uL (ref 0.0–0.1)
Basophils Relative: 0.5 % (ref 0.0–3.0)
Eosinophils Absolute: 0.1 10*3/uL (ref 0.0–0.7)
Eosinophils Relative: 2.1 % (ref 0.0–5.0)
HCT: 33.4 % — ABNORMAL LOW (ref 36.0–46.0)
Hemoglobin: 11.1 g/dL — ABNORMAL LOW (ref 12.0–15.0)
Lymphocytes Relative: 22.8 % (ref 12.0–46.0)
Lymphs Abs: 1.4 10*3/uL (ref 0.7–4.0)
MCHC: 33.2 g/dL (ref 30.0–36.0)
MCV: 95.2 fl (ref 78.0–100.0)
Monocytes Absolute: 0.5 10*3/uL (ref 0.1–1.0)
Monocytes Relative: 8.7 % (ref 3.0–12.0)
Neutro Abs: 4.1 10*3/uL (ref 1.4–7.7)
Neutrophils Relative %: 65.9 % (ref 43.0–77.0)
Platelets: 205 10*3/uL (ref 150.0–400.0)
RBC: 3.51 Mil/uL — ABNORMAL LOW (ref 3.87–5.11)
RDW: 14 % (ref 11.5–15.5)
WBC: 6.3 10*3/uL (ref 4.0–10.5)

## 2019-07-19 LAB — IRON: Iron: 52 ug/dL (ref 42–145)

## 2019-07-19 NOTE — Progress Notes (Signed)
This visit occurred during the SARS-CoV-2 public health emergency.  Safety protocols were in place, including screening questions prior to the visit, additional usage of staff PPE, and extensive cleaning of exam room while observing appropriate contact time as indicated for disinfecting solutions.  We talked about her pulmonary nodule and I asked her to talk to pulmonary.  She has f/u with Dr. Lisbeth Renshaw pending.  We talked about the issue of blood pool related to her PET scan.  This is not that she has a pool of blood but the baseline absorption in her blood on the scan.  History of anemia noted.  No blood in urine seen by patient.  No blood seen in stool.    She has CAD on CT but not having CP (except for heartburn).  She is already treated with statin and appropriate blood pressure medications.  She had her covid vaccine.  Discussed.  Meds, vitals, and allergies reviewed.   ROS: Per HPI unless specifically indicated in ROS section   GEN: nad, alert and oriented HEENT: ncat, on oxygen at baseline. NECK: supple w/o LA CV: rrr.  PULM: ctab, no inc wob ABD: soft, +bs EXT: no edema SKIN: Well-perfused.

## 2019-07-19 NOTE — Patient Instructions (Signed)
Go to the lab on the way out.   If you have mychart we'll likely use that to update you.    Check your B12 dose at home and let me know if it doesn't match up.  Take care.  Glad to see you.

## 2019-07-19 NOTE — Telephone Encounter (Signed)
The patient has been notified of this information and all questions answered.

## 2019-07-23 NOTE — Progress Notes (Signed)
Thoracic Location of Tumor / Histology: Right Upper Lobe Lung  Patient presented for routine scans for a nodule that has been monitored for a few years.  PET 07/18/2019: Increased metabolic activity in the area of the part solid nodule in the RIGHT upper lobe. Findings are suspicious for indolent bronchogenic neoplasm. Other small nodules in the chest without increased metabolic activity.  CT Chest 06/13/2019: Subsolid 2.2 cm posterior apical right upper lobe pulmonary nodule with 1.0 cm solid component, mildly increased since 12/14/2018 CT, slowly increasing on multiple CT studies back to 2017. Findings are most compatible with a slow growing primary bronchogenic carcinoma. Multidisciplinary thoracic oncology consultation advised.  No thoracic adenopathy. New tiny subpleural 4 mm left lung base solid pulmonary nodule, warranting attention on follow-up chest CT in 3 months.  Biopsies of   Tobacco/Marijuana/Snuff/ETOH use: Former Smoker, quit in 2006.  Dr. Halford Chessman 06/26/2019 -options at this time discussed are: 1) revisit bronchoscopy with ENB, 2) refer to [facility] to discuss option of radiation therapy w/o tissue confirmation of diagnosis, 3) clinical option - she doesn't want to take the risk of bronchoscopy and ENB in relation to anesthesia risk and pneumothorax risk - she would be willing to consider empiric radiation therapy - will therefore refer her case to [facility] for review to determine if she is a candidate for empiric radiation therapy  Past/Anticipated interventions by cardiothoracic surgery, if any:  -Not a good candidate for surgery.    Past/Anticipated interventions by medical oncology, if any:  -Saw Dr. Julien Nordmann in [facility]   Signs/Symptoms  Weight changes, if any: No  Respiratory complaints, if any: Wears 2.5-3 liters of oxygen, since about 2017.  Has SOB with exertion.  Hemoptysis, if any: Has dry cough, no blood noted.  Pain issues, if any:  No  SAFETY ISSUES:  Prior radiation?  No  Pacemaker/ICD? No  Possible current pregnancy? Hysterectomy  Is the patient on methotrexate? No  Current Complaints / other details:

## 2019-07-24 ENCOUNTER — Ambulatory Visit: Payer: PRIVATE HEALTH INSURANCE | Admitting: Radiation Oncology

## 2019-07-24 ENCOUNTER — Other Ambulatory Visit: Payer: Self-pay

## 2019-07-24 ENCOUNTER — Encounter: Payer: Self-pay | Admitting: Radiation Oncology

## 2019-07-24 ENCOUNTER — Ambulatory Visit
Admission: RE | Admit: 2019-07-24 | Discharge: 2019-07-24 | Disposition: A | Discharge status: Home / Self Care | Payer: Medicare Other | Source: Ambulatory Visit | Attending: Radiation Oncology | Admitting: Radiation Oncology

## 2019-07-24 DIAGNOSIS — J449 Chronic obstructive pulmonary disease, unspecified: Secondary | ICD-10-CM | POA: Diagnosis not present

## 2019-07-24 DIAGNOSIS — I2510 Atherosclerotic heart disease of native coronary artery without angina pectoris: Secondary | ICD-10-CM | POA: Insufficient documentation

## 2019-07-24 DIAGNOSIS — Z9981 Dependence on supplemental oxygen: Secondary | ICD-10-CM | POA: Diagnosis not present

## 2019-07-24 DIAGNOSIS — Z87891 Personal history of nicotine dependence: Secondary | ICD-10-CM | POA: Diagnosis not present

## 2019-07-24 DIAGNOSIS — C3411 Malignant neoplasm of upper lobe, right bronchus or lung: Secondary | ICD-10-CM

## 2019-07-24 HISTORY — DX: Malignant neoplasm of upper lobe, right bronchus or lung: C34.11

## 2019-07-24 NOTE — Assessment & Plan Note (Signed)
See notes on follow-up labs.  Ifob was positive and GI has been contacted.  She has GI follow-up pending.  I need GI input.  Discussed. At least 30 minutes were devoted to patient care in this encounter (this can potentially include time spent reviewing the patient's file/history, interviewing and examining the patient, counseling/reviewing plan with patient, ordering referrals, ordering tests, reviewing relevant laboratory or x-ray data, and documenting the encounter).

## 2019-07-24 NOTE — Assessment & Plan Note (Signed)
She has follow-up pending with Dr. Lisbeth Renshaw.  Discussed her recent PET scan.  I will await consult notes.  I appreciate the help of all involved.

## 2019-07-24 NOTE — Assessment & Plan Note (Addendum)
Discussed.  She has CAD noted on CT.She is already treated with statin and appropriate blood pressure medications.  Continue as is.  She agrees.  At least 30 minutes were devoted to patient care in this encounter (this can potentially include time spent reviewing the patient's file/history, interviewing and examining the patient, counseling/reviewing plan with patient, ordering referrals, ordering tests, reviewing relevant laboratory or x-ray data, and documenting the encounter).

## 2019-07-25 NOTE — Progress Notes (Signed)
Radiation Oncology         [phone] ________________________________  Initial Outpatient Consultation - Conducted via telephone due to current COVID-19 concerns for limiting patient exposure  I spoke with the patient to conduct this consult visit via telephone to spare the patient unnecessary potential exposure in the healthcare setting during the current COVID-19 pandemic. The patient was notified in advance and was offered a [place] meeting to allow for face to face communication but unfortunately reported that they did not have the appropriate resources/technology to support such a visit and instead preferred to proceed with a telephone consult.    Name: Alexandria [name]        MRN: [id number]  Date of Service: 07/24/2019 DOB: [birth year]  TG:GYIRSW, Elveria Rising, MD  Tonia Ghent, MD     REFERRING PHYSICIAN: Tonia Ghent, MD   DIAGNOSIS: The encounter diagnosis was Malignant neoplasm of right upper lobe of lung ([place]).   HISTORY OF PRESENT ILLNESS: Alexandria [name] is a 84 y.o. female seen at the request of Dr. Halford Chessman for a probable stage I lung cancer. The patient has a history of COPD and has been oxygen dependant at 2-3 L [place] for the last 3-4 years. She has been followed as well with nodules in both lungs since about 2017. She had recent imaging of the chest of 06/13/19 that revealed an increase in size in the dominant lesion in the apical RUL. This measured 2.2 x 1.2 cm and this was a few millimeters larger than in October 2020. The solid component was 1 cm, and had previously been 8 mm. She also has stable LLL nodule measuring 4 mm, and a 4 mm nodule in the RLL as well that was stable. PET imaging on 07/18/19 did reveal increased metabolic activity in the RUL lesion with an SUV of 2.2 despite blood pool of 3.32. She was counseled on the option of bronchoscopy for diagnosis of the presumed early stage lung cancer. She is not as interested in having any invasive procedures, or surgery. She is  contacted today to discuss options of definitively stereotactic body radiotherapy (SBRT) for her putative stage I lung cancer.  PREVIOUS RADIATION THERAPY: No   PAST MEDICAL HISTORY:  Past Medical History:  Diagnosis Date  . Allergy, unspecified not elsewhere classified   . Cataract   . Chronic diastolic congestive heart failure ([place])   . COPD (chronic obstructive pulmonary disease) ([place]) 03/08/1998   PFTs 12/12/2002 FEV 1 1.42 (64%) ratio 58 with no better after B2 and DLCO75%; PFTs 11/20/09 FEV1 1.50 (73%) ratio 50 no better after B2 with DLCO 62%; Hfa 50% 11/20/2009 >75%, 01/13/10 p coaching  . Depression 12/07/1998  . Diabetes mellitus type II 02/05/2006   Dr. Cruzita Lederer with endo  . Disorders of bursae and tendons in shoulder region, unspecified    Rotator cuff syndrome, right  . E. coli bacteremia   . Esophagitis   . GERD (gastroesophageal reflux disease)   . History of UTI   . Hyperlipidemia 10/04/2000  . Hypertension 03/08/1992  . Hypothyroidism 03/08/1968  . Iron deficiency anemia   . Malignant neoplasm of right upper lobe of lung ([place]) 07/24/2019  . Obesity    NOS  . OSA (obstructive sleep apnea)    PSG 01/27/10 AHI 13, pt does not know CPAP settings  . Peripheral neuropathy    Likely due to DM per Dr. Erling Cruz  . Ventral hernia        PAST SURGICAL HISTORY: Past  Surgical History:  Procedure Laterality Date  . ABD U/S  03/19/1999   Nml x2 foci in liver  . ADENOSINE MYOVIEW  06/02/2007   Nml  . CARDIOLITE PERSANTINE  08/24/2000   Nml  . CAROTID U/S  08/24/2000   1-39% ICA stenosis  . CAROTID U/S  06/02/2007   No apprec change   . CARPAL TUNNEL RELEASE  12/1997   Right  . CESAREAN SECTION     x2 [place]/ repeat  . CHOLECYSTECTOMY  1997  . COLONOSCOPY WITH PROPOFOL N/A 02/12/2016   Procedure: COLONOSCOPY WITH PROPOFOL;  Surgeon: Milus Banister, MD;  Location: [facility];  Service: Endoscopy;  Laterality: N/A;  . CT ABD W & PELVIS WO/W CM     Abd hemangiomas of  liver, 1 cm R renal cyst  . DENTAL SURGERY  2016   Implants  . DEXA  07/03/2003   Nml  . ESOPHAGOGASTRODUODENOSCOPY  12/05/1997   Nml (due to hoarseness)  . ESOPHAGOGASTRODUODENOSCOPY (EGD) WITH PROPOFOL N/A 02/12/2016   Procedure: ESOPHAGOGASTRODUODENOSCOPY (EGD) WITH PROPOFOL;  Surgeon: Milus Banister, MD;  Location: [facility];  Service: Endoscopy;  Laterality: N/A;  . GALLBLADDER SURGERY    . HERNIA REPAIR  01/24/2009   Lap Ventr w/ Lysis of adhesions (Dr. Donne Hazel)  . knee arthroscopic surgery  years ago   right  . ROTATOR CUFF REPAIR  1984   Right, [place]  . SHOULDER OPEN ROTATOR CUFF REPAIR  02/08/2012   Procedure: ROTATOR CUFF REPAIR SHOULDER OPEN;  Surgeon: Magnus Sinning, MD;  Location: [facility];  Service: Orthopedics;  Laterality: Left;  Left Shoulder Open Anterior Acrominectomy Rotator Cuff Repair Open Distal Clavicle Resection ,tissue mend graft, and repair of biceps tendon  . THUMB RELEASE  12/1997   Right  . TONSILLECTOMY    . TOTAL ABDOMINAL HYSTERECTOMY  1985   Due to dysmennorhea  . US ECHOCARDIOGRAPHY  06/02/2007     FAMILY HISTORY:  Family History  Problem Relation Age of Onset  . Heart disease Mother   . Thyroid disease Mother   . Emphysema Father        One lung  . Cystic fibrosis Sister   . Hyperthyroidism Sister   . Osteoarthritis Brother   . Hyperthyroidism Brother   . Esophageal cancer Neg Hx   . Cancer Neg Hx        Head or neck  . Colon cancer Neg Hx   . Stomach cancer Neg Hx   . Breast cancer Neg Hx      SOCIAL HISTORY:  reports that she quit smoking about 14 years ago. Her smoking use included cigarettes. She has a 100.00 pack-year smoking history. She has never used smokeless tobacco. She reports current alcohol use. She reports that she does not use drugs. The patient is married and lives in [place].    ALLERGIES: Antihistamines, diphenhydramine-type; Sulfonamide derivatives; Incruse ellipta [umeclidinium bromide];  Clarithromycin; Codeine; Lyrica [pregabalin]; and Spiriva handihaler [tiotropium bromide monohydrate]   MEDICATIONS:  Current Outpatient Medications  Medication Sig Dispense Refill  . albuterol (PROAIR HFA) 108 (90 Base) MCG/ACT inhaler Inhale 2 puffs into the lungs every 6 (six) hours as needed for wheezing or shortness of breath. INHALE 2 PUFFS INTO THE LUNGS EVERY 6 HOURS AS NEEDED FOR WHEEZING OR SHORTNESS OF BREATH 3 Inhaler 3  . budesonide-formoterol (SYMBICORT) 160-4.5 MCG/ACT inhaler Inhale 2 puffs into the lungs 2 (two) times daily. 3 Inhaler 3  . busPIRone (BUSPAR) 5 MG tablet As needed    .  Cholecalciferol (VITAMIN D) 50 MCG (2000 UT) tablet Take 1 tablet (2,000 Units total) by mouth daily.    . Coenzyme Q-10 200 MG CAPS Take 200 mg by mouth daily.     . Cyanocobalamin (B-12) 5000 MCG CAPS Take 5,000 mcg by mouth daily.    Marland Kitchen EPINEPHrine (EPIPEN) 0.3 mg/0.3 mL DEVI Inject 0.3 mg into the muscle once as needed (anaphylaxis).     Marland Kitchen esomeprazole (NEXIUM) 40 MG capsule Take 40 mg by mouth every other day.     . ezetimibe (ZETIA) 10 MG tablet TAKE 1 TABLET BY MOUTH EVERYDAY AT BEDTIME 90 tablet 1  . ferrous sulfate 325 (65 FE) MG EC tablet Take 1 tablet (325 mg total) by mouth daily with breakfast.    . fexofenadine (ALLEGRA) 180 MG tablet Take 180 mg by mouth as needed.     Marland Kitchen FLUoxetine (PROZAC) 20 MG capsule Take 20 mg by mouth 2 (two) times daily.    . fluticasone (FLONASE) 50 MCG/ACT nasal spray USE 2 SPRAYS INTO THE NOSE EVERY 12 HOURS AS NEEDED FOR STUFFY NOSE 48 g 2  . furosemide (LASIX) 20 MG tablet Take 1 tablet (20 mg total) by mouth daily as needed for fluid. 90 tablet 1  . gabapentin (NEURONTIN) 100 MG capsule Take 1-2 capsules (100-200 mg total) by mouth 2 (two) times daily as needed. First 5 days take 100mg  at night 100 capsule 2  . glucose blood (ONETOUCH VERIO) test strip Use as instructed to check sugar 2-3x time daily 200 each 5  . ibuprofen (ADVIL,MOTRIN) 200 MG tablet  Take 200-400 mg by mouth every 6 (six) hours as needed for moderate pain.     . metFORMIN (GLUCOPHAGE) 500 MG tablet Take 1 tablet (500 mg total) by mouth daily with supper. 90 tablet 3  . nitroGLYCERIN (NITROSTAT) 0.4 MG SL tablet Place 1 tablet (0.4 mg total) under the tongue every 5 (five) minutes as needed for chest pain. 25 tablet 3  . ONETOUCH DELICA LANCETS FINE MISC USE TO CHECK SUGAR 1 TIME DAILY 100 each 5  . ONETOUCH VERIO test strip USE AS INSTRUCTED TO CHECK SUGAR 1 TIME DAILY 100 each 8  . potassium chloride (K-DUR) 10 MEQ tablet Take 1 tablet (10 mEq total) by mouth daily as needed (Takes with furosemide doses.). 90 tablet 1  . rosuvastatin (CRESTOR) 10 MG tablet TAKE 1 TABLET (10 MG TOTAL) BY MOUTH EVERY OTHER DAY. 45 tablet 2  . Spacer/Aero Chamber [employer] Use with  inhaler as needed.  J44.9 1 each 0  . SYNTHROID 137 MCG tablet Take 1 tablet (137 mcg total) by mouth daily before breakfast. 90 tablet 2  . valsartan (DIOVAN) 160 MG tablet TAKE ONE HALF TABLET (80 MG TOTAL) 2 TIMES DAILY. 90 tablet 1  . Azelastine-Fluticasone 137-50 MCG/ACT SUSP Place 2 puffs into the nose 1 day or 1 dose for 1 dose. 1 Bottle 3   No current facility-administered medications for this encounter.     REVIEW OF SYSTEMS: On review of systems, the patient reports that she is doing well overall. She uses 2-3 L O2 via [place]. Her daughter states that there have been some discussion though that she may use this intermittently rather than continuous, but she does typically use this continuously. She denies any chest pain, shortness of breath, cough, fevers, chills, night sweats, unintended weight changes. No other complaints are noted.    PHYSICAL EXAM:  Wt Readings from Last 3 Encounters:  07/24/19 221 lb (  100.2 kg)  07/19/19 221 lb 2 oz (100.3 kg)  06/26/19 218 lb 9.6 oz (99.2 kg)   Unable to assess due to encounter type.  ECOG = 0 0 - Asymptomatic (Fully active, able to carry on all predisease  activities without restriction)  1 - Symptomatic but completely ambulatory (Restricted in physically strenuous activity but ambulatory and able to carry out work of a light or sedentary nature. For example, light housework, office work)  2 - Symptomatic, <50% in bed during the day (Ambulatory and capable of all self care but unable to carry out any work activities. Up and about more than 50% of waking hours)  3 - Symptomatic, >50% in bed, but not bedbound (Capable of only limited self-care, confined to bed or chair 50% or more of waking hours)  4 - Bedbound (Completely disabled. Cannot carry on any self-care. Totally confined to bed or chair)  5 - Death   Eustace Pen MM, Creech RH, Tormey DC, et al. [phone]). "Toxicity and response criteria of the [facility]". [place] Oncol. 5 (6): 649-55    LABORATORY DATA:  Lab Results  Component Value Date   WBC 6.3 07/19/2019   HGB 11.1 (L) 07/19/2019   HCT 33.4 (L) 07/19/2019   MCV 95.2 07/19/2019   PLT 205.0 07/19/2019   Lab Results  Component Value Date   NA 142 06/04/2019   K 4.4 06/04/2019   CL 108 06/04/2019   CO2 31 06/04/2019   Lab Results  Component Value Date   ALT 11 06/04/2019   AST 13 06/04/2019   ALKPHOS 71 06/04/2019   BILITOT 0.4 06/04/2019      RADIOGRAPHY: NM PET Image Restag (PS) Skull Base To Thigh  Result Date: 07/19/2019 CLINICAL DATA:  Subsequent treatment strategy for lung nodule. EXAM: NUCLEAR MEDICINE PET SKULL BASE TO THIGH TECHNIQUE: 10.2 mCi F-18 FDG was injected intravenously. Full-ring PET imaging was performed from the skull base to thigh after the radiotracer. CT data was obtained and used for attenuation correction and anatomic localization. Fasting blood glucose: 102 mg/dl COMPARISON:  PET exam 01/06/2018 and CT chest 06/13/2019 FINDINGS: Mediastinal blood pool activity: SUV max 3.32 Liver activity: SUV max NA NECK: No hypermetabolic lymph nodes in the neck. Overall neck assessment  limited by susceptibility artifact from dental hardware. Incidental CT findings: none CHEST: Sub solid nodule in the RIGHT upper lobe measuring approximately 2 x 1.2 cm with solid component approximately 1 cm, difficult to measure given the respiratory motion on the current study similar to recent CT of the chest. Smaller nodules in the RIGHT upper lobe surrounding this area are unchanged. No consolidation. No pleural effusion. Basilar scarring and atelectasis. Lingular scarring. SUV of this area, max SUV 2.2 on today's study no other areas of hypermetabolic change. No lymph nodes in the chest with hypermetabolic features Incidental CT findings: Calcified atheromatous plaque. Three-vessel coronary artery disease. Mitral annular calcification. No pericardial effusion. Limited assessment of great vessels in the chest due to lack of intravenous contrast. ABDOMEN/PELVIS: No abnormal hypermetabolic activity within the liver, pancreas, adrenal glands, or spleen. No hypermetabolic lymph nodes in the abdomen or pelvis. Incidental CT findings: Post cholecystectomy. Signs of abdominal wall hernia repair. RIGHT renal cysts. Colonic diverticulosis. Marked calcific atherosclerotic changes of the abdominal aorta without aneurysm. Post hysterectomy. SKELETON: No focal hypermetabolic activity to suggest skeletal metastasis. Incidental CT findings: Increased activity about the glenohumeral joints likely related to degenerative changes. Facet arthropathy in the cervical spine leading to  some activity as well in the mid cervical spine. IMPRESSION: 1. Increased metabolic activity in the area of the part solid nodule in the RIGHT upper lobe. Findings are suspicious for indolent bronchogenic neoplasm. Activity remains below blood pool. 2. Other small nodules in the chest without increased metabolic activity. 3. Atherosclerosis and coronary artery disease. Aortic Atherosclerosis (ICD10-I70.0). Electronically Signed   By: Zetta Bills  M.D.   On: 07/19/2019 08:55       IMPRESSION/PLAN: 1. Putative Stage IA3, cT1cN0M0, NSCLC of the RUL. Dr. Lisbeth Renshaw discusses the pathology findings and reviews the nature of early stage lung cancer. Ideally she would proceed with bronchoscopy to obtain definitive tissue diagnosis. She is at risk of pneumothorax and for this reason is less inclined to proceed. Her PET scan did however show increased metabolic changes in the RUL lesion, and while low level SUV, Dr. Lisbeth Renshaw reviews her prior imaging which does show slow interval growth that would be more consistent with a malignant process. For this reason he would agree that in a patient who is not a good surgical candidate or who wishes to avoid this, Stereotatic body radiotherapy (SBRT) would be an acceptable alternative. We discussed the risks, benefits, short, and long term effects of radiotherapy, and the patient is interested in proceeding. Dr. Lisbeth Renshaw discusses the delivery and logistics of radiotherapy and anticipates a course of 3-5 fractions of radiotherapy. She would like to speak with her family and let us know how she would like to proceed. I will call her if I haven't heard back in the next week to coordinate treatment if she is ready.   Given current concerns for patient exposure during the COVID-19 pandemic, this encounter was conducted via telephone.  The patient has provided two factor identification and has given verbal consent for this type of encounter and has been advised to only accept a meeting of this type in a secure network environment. The time spent during this encounter was 60 minutes including preparation, discussion, and coordination of the patient's care. The attendants for this meeting include Blenda Nicely, RN, Dr. Lisbeth Renshaw, Hayden Pedro  and [name] and her daughter Adah Salvage.   During the encounter,  Blenda Nicely, RN, Dr. Lisbeth Renshaw, and Hayden Pedro were located at [facility].  SHYLO ZAMOR was located at home and her daughter was located remotely as well.    The above documentation reflects my direct findings during this shared patient visit. Please see the separate note by Dr. Lisbeth Renshaw on this date for the remainder of the patient's plan of care.    Carola Rhine, PAC

## 2019-08-01 ENCOUNTER — Telehealth: Payer: Self-pay | Admitting: Radiation Oncology

## 2019-08-01 NOTE — Telephone Encounter (Signed)
I called the patient's daughter Alexandria [name] and she was able to get her mother on the phone as well and after consideration, the patient has decided she would like to try to proceed but with treatment but is nervous about claustrophobia and asked about the options of treatment later this summer rather than right now. I let them know that while her prior imaging supports a slow growing process, there is still a risk of the putative cancer growing in the interval and possibly changes treatment options if it were to spread outside of the lungs. They understand and would like to talk some more about treatment amongst themselves and family this weekend. They will call me back next week with their decision so we can make definitive plans for next steps.

## 2019-08-03 ENCOUNTER — Telehealth: Payer: Self-pay | Admitting: Gastroenterology

## 2019-08-03 NOTE — Telephone Encounter (Signed)
The pt was rescheduled to 7/2.  The first available date that she accepted.

## 2019-08-03 NOTE — Telephone Encounter (Signed)
Patient is calling- states that she has to have radiation due to nodule on lung first week in August. She has appointment with Dr. Ardis Hughs 06/16 but is asking for sooner appointment- she requested to speak with nurse about why she would like sooner. She states that she wants the bleeding under control before radiation.

## 2019-08-07 ENCOUNTER — Ambulatory Visit: Payer: PRIVATE HEALTH INSURANCE | Admitting: Pulmonary Disease

## 2019-08-16 ENCOUNTER — Telehealth: Payer: Self-pay | Admitting: Family Medicine

## 2019-08-16 NOTE — Telephone Encounter (Addendum)
Given her situation, I advise OV with u/a or drop off u/a and then set up virtual visit.

## 2019-08-16 NOTE — Telephone Encounter (Signed)
Pt says she has a UTI and she is going out of town and does not want to come in. She will be gone for a week and she needs a prescription called in. I let her know you will need a urinalysis or office visit before giving a prescription and she said just let her provider know because this is not the first time she has had a UTI. Please advise.

## 2019-08-16 NOTE — Telephone Encounter (Signed)
Pt notified and voiced understanding but said that she does not have time for any type of appt and she knows what is wrong with her. Tried to explain need of cking urine and being evaluated and offered pt 3 options for appts this afternoon at [facility] either in person or virtual. Pt said she does not think so. I advised pt could go to UC either here or wherever hers destination was and pt said she did not think so and call ended.FYI to [name].

## 2019-08-16 NOTE — Telephone Encounter (Signed)
Thank you for contacting patient.  I'll defer to patient.

## 2019-08-22 ENCOUNTER — Ambulatory Visit: Payer: PRIVATE HEALTH INSURANCE | Admitting: Gastroenterology

## 2019-08-22 ENCOUNTER — Other Ambulatory Visit: Payer: Self-pay | Admitting: Family Medicine

## 2019-08-25 ENCOUNTER — Other Ambulatory Visit: Payer: Self-pay | Admitting: Family Medicine

## 2019-08-28 DIAGNOSIS — F4323 Adjustment disorder with mixed anxiety and depressed mood: Secondary | ICD-10-CM | POA: Diagnosis not present

## 2019-08-29 NOTE — Progress Notes (Signed)
08/29/2019 Alexandria [name] [id number] 07/01/1935   Chief Complaint: Iron deficiency anemia   History of Present Illness: Alexandria [name] is an 84 year old female with a past medical history of depression, hypertension, hyperlipidemia, CHF, COPD, diabetes mellitus type 2, sleep apnea, GERD and iron deficiency anemia.   Her most recent EGD and colonoscopy were done 02/12/2020 due to having IDA.  The EGD showed a small hiatal hernia otherwise was normal. A colonoscopy was done on the same date which showed sigmoid diverticulosis, a lipoma in the sigmoid colon and internal hemorrhoids.   She was last seen in our office by Dr. Ardis Hughs 08/17/2016.  At that time, her hemoglobin level was 13.  Due to her multiple comorbidities including 24-hour oxygen use, morbid obesity and CHF further GI work-up was not recommended.  She was advised to continue po iron once daily indefinitely.    She was recently diagnosed with stage I lung cancer within the right upper lobe.  A PET scan showed some metabolic activity in the region of this tumor which is consistent with the diagnosis of a slow-growing primary lung cancer. She was diagnosed with stage I non-small cell lung cancer of the right upper lobe, based on CT serial imaging/PET scan. A lung biopsy was not done due to high risk for complications. She was evaluated by oncology radiologist, Dr. Kyung Rudd with plans to initiate radiation 10/08/2019.   She presents to our office today for further evaluation for IDA and + FOBT. Her reflux is well controlled. No current heartburn, dysphagia or upper abdominal pain. She is taking Esomeprazole 40mg  one capsule QOD. No lower abdominal pain. She complains of having a lump in her stomach (she points to her abdominal bulge/diastasis recti).  She is passing a normal darker brown formed stools daily. However, if she becomes nervous/anxiius she will pass a looser stool. She denies having any black stools. She remains of Ferrous  Sulfate 325mg  po daily. No rectal bleeding. She rarely takes Advil. She takes Tylenol for aches and pains as needed. She uses oxygen 2L [place] 24/7. She has gained 12lbs over the past year, weighs 222lbs today. No other complaints today.   Labs 07/19/2019: WBC 6.3. Hg 11.1. HCT 33.4. MCV 95.2. PLT 205. Iron 52. Labs 06/04/2019: WBC 6.8. Hg 11.6. HCT 35. MCV 96.7. PLT 204. Iron 53.    CBC Latest Ref Rng & Units 07/19/2019 06/04/2019 05/23/2019  WBC 4.0 - 10.5 K/uL 6.3 6.8 5.9  Hemoglobin 12.0 - 15.0 g/dL 11.1(L) 11.6(L) 10.7(L)  Hematocrit 36 - 46 % 33.4(L) 35.0(L) 32.9(L)  Platelets 150 - 400 K/uL 205.0 204.0 180   CMP Latest Ref Rng & Units 06/04/2019 07/06/2018 06/20/2018  Glucose 70 - 99 mg/dL 117(H) 104(H) 111(H)  BUN 6 - 23 mg/dL 24(H) 28(H) 27(H)  Creatinine 0.40 - 1.20 mg/dL 0.88 0.88 0.98  Sodium 135 - 145 mEq/L 142 142 142  Potassium 3.5 - 5.1 mEq/L 4.4 4.8 5.5(H)  Chloride 96 - 112 mEq/L 108 104 104  CO2 19 - 32 mEq/L 31 31 32  Calcium 8.4 - 10.5 mg/dL 8.9 9.2 9.5  Total Protein 6.0 - 8.3 g/dL 6.4 - 7.0  Total Bilirubin 0.2 - 1.2 mg/dL 0.4 - 0.3  Alkaline Phos 39 - 117 U/L 71 - 83  AST 0 - 37 U/L 13 - 13  ALT 0 - 35 U/L 11 - 11    EGD 02/12/2016: - Small hiatal hernia. - The examination was otherwise normal. -  No specimens collected  Colonoscopy 02/12/2016: - Diverticulosis in the left colon. - Medium-sized pedunculated lipoma in the sigmoid colon. - Internal hemorrhoids - The examination was otherwise normal on direct and retroflexion views. - No specimens collected. - No further colonoscopies were recommended due to age    Current Outpatient Medications on File Prior to Visit  Medication Sig Dispense Refill  . albuterol (PROAIR HFA) 108 (90 Base) MCG/ACT inhaler Inhale 2 puffs into the lungs every 6 (six) hours as needed for wheezing or shortness of breath. INHALE 2 PUFFS INTO THE LUNGS EVERY 6 HOURS AS NEEDED FOR WHEEZING OR SHORTNESS OF BREATH 3 Inhaler 3  .  budesonide-formoterol (SYMBICORT) 160-4.5 MCG/ACT inhaler Inhale 2 puffs into the lungs 2 (two) times daily. 3 Inhaler 3  . busPIRone (BUSPAR) 5 MG tablet 5 mg 3 (three) times daily. As needed    . Cholecalciferol (VITAMIN D) 50 MCG (2000 UT) tablet Take 1 tablet (2,000 Units total) by mouth daily.    . Coenzyme Q-10 200 MG CAPS Take 200 mg by mouth daily.     . Cyanocobalamin (B-12) 5000 MCG CAPS Take 5,000 mcg by mouth daily.    Marland Kitchen EPINEPHrine (EPIPEN) 0.3 mg/0.3 mL DEVI Inject 0.3 mg into the muscle once as needed (anaphylaxis).     Marland Kitchen esomeprazole (NEXIUM) 40 MG capsule Take 40 mg by mouth every other day.     . ezetimibe (ZETIA) 10 MG tablet TAKE 1 TABLET BY MOUTH EVERYDAY AT BEDTIME 90 tablet 1  . ferrous sulfate 325 (65 FE) MG EC tablet Take 1 tablet (325 mg total) by mouth daily with breakfast.    . fexofenadine (ALLEGRA) 180 MG tablet Take 180 mg by mouth as needed.     Marland Kitchen FLUoxetine (PROZAC) 20 MG capsule Take 20 mg by mouth 2 (two) times daily.    . fluticasone (FLONASE) 50 MCG/ACT nasal spray USE 2 SPRAYS INTO THE NOSE EVERY 12 HOURS AS NEEDED FOR STUFFY NOSE 48 g 2  . glucose blood (ONETOUCH VERIO) test strip Use as instructed to check sugar 2-3x time daily 200 each 5  . metFORMIN (GLUCOPHAGE) 500 MG tablet Take 1 tablet (500 mg total) by mouth daily with supper. 90 tablet 3  . Multiple Vitamins-Minerals (ZINC PO) Take by mouth.    . nitroGLYCERIN (NITROSTAT) 0.4 MG SL tablet Place 1 tablet (0.4 mg total) under the tongue every 5 (five) minutes as needed for chest pain. 25 tablet 3  . ONETOUCH DELICA LANCETS FINE MISC USE TO CHECK SUGAR 1 TIME DAILY 100 each 5  . ONETOUCH VERIO test strip USE AS INSTRUCTED TO CHECK SUGAR 1 TIME DAILY 100 each 8  . rosuvastatin (CRESTOR) 10 MG tablet TAKE 1 TABLET (10 MG TOTAL) BY MOUTH EVERY OTHER DAY. 45 tablet 2  . Spacer/Aero Chamber [employer] Use with  inhaler as needed.  J44.9 1 each 0  . SYNTHROID 137 MCG tablet Take 1 tablet (137 mcg total)  by mouth daily before breakfast. 90 tablet 2  . valsartan (DIOVAN) 160 MG tablet TAKE ONE HALF TABLET (80 MG TOTAL) 2 TIMES DAILY. 90 tablet 1  . Azelastine-Fluticasone 137-50 MCG/ACT SUSP Place 2 puffs into the nose 1 day or 1 dose for 1 dose. 1 Bottle 3  . furosemide (LASIX) 20 MG tablet Take 1 tablet (20 mg total) by mouth daily as needed for fluid. 90 tablet 1  . potassium chloride (K-DUR) 10 MEQ tablet Take 1 tablet (10 mEq total) by mouth daily as  needed (Takes with furosemide doses.). 90 tablet 1   No current facility-administered medications on file prior to visit.    Allergies  Allergen Reactions  . Antihistamines, Diphenhydramine-Type Other (See Comments)    Able to tolerate only allegra.   . Sulfonamide Derivatives Swelling    REACTION: closed throat  . Incruse Ellipta [Umeclidinium Bromide] Cough    Caused voice change and severe coughing  . Clarithromycin Other (See Comments)    REACTION: diff swallowing and mouth blisters  . Codeine Other (See Comments)    Unknown   . Lyrica [Pregabalin] Other (See Comments)    Lack of effect for neuropathy pain.    Marland Kitchen Spiriva Handihaler [Tiotropium Bromide Monohydrate] Other (See Comments)    Voice changes    Current Medications, Allergies, Past Medical History, Past Surgical History, Family History and Social History were reviewed in [employer] record.   Physical Exam: BP 122/78   Pulse 76   Ht 5\' 5"  (1.651 m)   Wt 222 lb (100.7 kg)   SpO2 96%   BMI 36.94 kg/m  General: 84 year old female utilizing oxygen 2L [place] in no acute distress. Head: Normocephalic and atraumatic. Eyes: No scleral icterus. Conjunctiva pink . Ears: Normal auditory acuity. Mouth: Dentition intact. No ulcers or lesions.  Lungs: Diminished breath sounds throughout.  Heart: Regular rate and rhythm, no murmur. Abdomen: Soft, nontender and nondistended. Moderate diastasis recti, questionable ventral hernia component. No masses or  hepatomegaly. Normal bowel sounds x 4 quadrants.  Rectal: Deferred.  Musculoskeletal: Symmetrical with no gross deformities. Extremities: RLE 2+ edema and erythema, LLE 1+ edema less erythema when compared to the RLE. (Patient reports right and left LE edema and erythema are chronic, no change).  Neurological: Alert oriented x 4. No focal deficits.  Psychological: Alert and cooperative. Normal mood and affect  Assessment and Recommendations:  29. 84 year old female with IDA and + FOBT. EGD/colonoscopy in 2017 did not identify etiology of IDA. -Repeat CBC today. Due to her multiple comorbidities invasive endoscopic/small bowel capsule endoscopy evaluation deferred unless she demonstrates significant active GI bleeding or worsening anemia. -Further recommendations per Dr. Ardis Hughs  2.  Stage I non-small cell lung cancer of the right upper lobe, based on CT serial imaging/PET scan. A -Proceed with plans for radiation treatment as recommended by oncology radiology Dr. Lisbeth Renshaw  3. DM II  4. CHF. LV EF 55-60^ 09/2014  5. GERD, stable on PPI QOD

## 2019-08-30 ENCOUNTER — Ambulatory Visit (INDEPENDENT_AMBULATORY_CARE_PROVIDER_SITE_OTHER): Payer: Medicare Other | Admitting: Nurse Practitioner

## 2019-08-30 ENCOUNTER — Encounter: Payer: Self-pay | Admitting: Nurse Practitioner

## 2019-08-30 ENCOUNTER — Other Ambulatory Visit (INDEPENDENT_AMBULATORY_CARE_PROVIDER_SITE_OTHER): Payer: Medicare Other

## 2019-08-30 VITALS — BP 122/78 | HR 76 | Ht 65.0 in | Wt 222.0 lb

## 2019-08-30 DIAGNOSIS — D509 Iron deficiency anemia, unspecified: Secondary | ICD-10-CM

## 2019-08-30 DIAGNOSIS — N390 Urinary tract infection, site not specified: Secondary | ICD-10-CM | POA: Diagnosis not present

## 2019-08-30 DIAGNOSIS — R195 Other fecal abnormalities: Secondary | ICD-10-CM | POA: Diagnosis not present

## 2019-08-30 DIAGNOSIS — E039 Hypothyroidism, unspecified: Secondary | ICD-10-CM

## 2019-08-30 DIAGNOSIS — I2510 Atherosclerotic heart disease of native coronary artery without angina pectoris: Secondary | ICD-10-CM | POA: Diagnosis not present

## 2019-08-30 LAB — URINALYSIS, ROUTINE W REFLEX MICROSCOPIC
Bilirubin Urine: NEGATIVE
Ketones, ur: NEGATIVE
Nitrite: NEGATIVE
Specific Gravity, Urine: 1.02 (ref 1.000–1.030)
Total Protein, Urine: NEGATIVE
Urine Glucose: NEGATIVE
Urobilinogen, UA: 0.2 (ref 0.0–1.0)
pH: 6 (ref 5.0–8.0)

## 2019-08-30 LAB — CBC
HCT: 36.2 % (ref 36.0–46.0)
Hemoglobin: 12.1 g/dL (ref 12.0–15.0)
MCHC: 33.3 g/dL (ref 30.0–36.0)
MCV: 93.6 fl (ref 78.0–100.0)
Platelets: 201 10*3/uL (ref 150.0–400.0)
RBC: 3.87 Mil/uL (ref 3.87–5.11)
RDW: 14.1 % (ref 11.5–15.5)
WBC: 6.7 10*3/uL (ref 4.0–10.5)

## 2019-08-30 LAB — TSH: TSH: 6.56 u[IU]/mL — ABNORMAL HIGH (ref 0.35–4.50)

## 2019-08-30 NOTE — Patient Instructions (Addendum)
If you are age 82 or older, your body mass index should be between 23-30. Your Body mass index is 36.94 kg/m. If this is out of the aforementioned range listed, please consider follow up with your Primary Care Provider.  If you are age 70 or younger, your body mass index should be between 19-25. Your Body mass index is 36.94 kg/m. If this is out of the aformentioned range listed, please consider follow up with your Primary Care Provider.   Your provider has requested that you go to the basement level for lab work before leaving today. Press "B" on the elevator. The lab is located at the first door on the left as you exit the elevator.  Continue with Ferrous Sulfate 325mg  1 tablet daily Jaclyn Shaggy, NP will contact you with Dr Ardis Hughs recommendations.  Due to recent changes in healthcare laws, you may see the results of your imaging and laboratory studies on MyChart before your provider has had a chance to review them.  We understand that in some cases there may be results that are confusing or concerning to you. Not all laboratory results come back in the same time frame and the provider may be waiting for multiple results in order to interpret others.  Please give [place] 48 hours in order for your provider to thoroughly review all the results before contacting the office for clarification of your results.

## 2019-08-31 ENCOUNTER — Telehealth: Payer: Self-pay | Admitting: Family Medicine

## 2019-08-31 NOTE — Telephone Encounter (Signed)
Called pt.  Awaiting ucx.  Minimal dysuria.  We agreed to hold off treatment for now and await ucx.  She'll update me if worse in the meantime.  She is travelling to [place] but can update me [id number] pharmacy if needed over the weekend.  She agrees with plan.    We talked about prev IFOB pos and recent GI eval along with recent lung findings.  I appreciate help of all involved.  She thanked me for the call.

## 2019-08-31 NOTE — Progress Notes (Signed)
I called the patient and I left her a detailed message on her personal voicemail regarding her Hg level has improved. Dr. Ardis Hughs did not recommend any invasive GI evaluation at this time. Patient to stay on oral iron indefinitely.

## 2019-08-31 NOTE — Progress Notes (Signed)
I agree with the above note, plan.  Oral iron supplementation daily seems to be a very reasonable solution to her problem.

## 2019-08-31 NOTE — Telephone Encounter (Signed)
Hi Dr. [name], I should have put in my note, the pt said she had dysuria and she tried to submit a urine test from your office but she went to the wrong lab drop off? Since I saw her yesterday, she asked if I would order the urine test. I ordered a ua and cx.  Form the patient the culture would take several days before the results would be obtained and if the results were abnormal she would have to follow-up with you for treatment.  [employer]

## 2019-08-31 NOTE — Telephone Encounter (Signed)
Patient called She stated she seen Dr Ardis Hughs yesterday like [name] had asked. She stated they done lab work and she has received the results but doesn't understand them. She said that she spoke with Dr Ardis Hughs office and they said the results for the uti would come from [name] .   Patient is calling to discuss her lab results

## 2019-08-31 NOTE — Telephone Encounter (Signed)
Routed to [facility]- What urinary symptoms did patient have yesterday?  Please let me know.  Thanks.   Routed to [place]- awaiting report back from GI, FYI to patient.  Thanks.

## 2019-09-02 ENCOUNTER — Other Ambulatory Visit: Payer: Self-pay | Admitting: Family Medicine

## 2019-09-02 LAB — URINE CULTURE
MICRO NUMBER:: 10630143
SPECIMEN QUALITY:: ADEQUATE

## 2019-09-02 NOTE — Telephone Encounter (Signed)
Please call pt.  Ucx positive.  Please verify her pharmacy (currently in [place]) and send keflex rx.  Thanks.

## 2019-09-03 ENCOUNTER — Encounter: Payer: Self-pay | Admitting: Family Medicine

## 2019-09-03 ENCOUNTER — Telehealth: Payer: Self-pay

## 2019-09-03 MED ORDER — CEPHALEXIN 250 MG PO CAPS: 250.0000 mg | ORAL_CAPSULE | Freq: Two times a day (BID) | ORAL | 0 refills | Status: DC

## 2019-09-03 NOTE — Telephone Encounter (Signed)
Spoke with patient daughter [name] in regards to questions for Shona Simpson PA. Advised per Bryson Ha, ok for mom to have a permanent on her hair. Advised that it was ok to have patient UTI treated. Advised that there were no dietary or activity restrictions during radiation treatment. Daughter verbalized understanding of information given. TM

## 2019-09-03 NOTE — Telephone Encounter (Signed)
Phoned in to pharmacy in [place]

## 2019-09-03 NOTE — Telephone Encounter (Signed)
Instructions were given on another note.  Rx sent to pharmacy in [place] per patient's request.

## 2019-09-07 ENCOUNTER — Ambulatory Visit: Payer: PRIVATE HEALTH INSURANCE | Admitting: Gastroenterology

## 2019-09-18 ENCOUNTER — Other Ambulatory Visit: Payer: Self-pay

## 2019-09-18 ENCOUNTER — Ambulatory Visit (INDEPENDENT_AMBULATORY_CARE_PROVIDER_SITE_OTHER): Payer: Medicare Other | Admitting: Pulmonary Disease

## 2019-09-18 ENCOUNTER — Encounter: Payer: Self-pay | Admitting: Pulmonary Disease

## 2019-09-18 VITALS — BP 126/58 | HR 76 | Temp 97.9°F | Ht 65.0 in | Wt 222.4 lb

## 2019-09-18 DIAGNOSIS — J432 Centrilobular emphysema: Secondary | ICD-10-CM

## 2019-09-18 DIAGNOSIS — J9611 Chronic respiratory failure with hypoxia: Secondary | ICD-10-CM

## 2019-09-18 DIAGNOSIS — J449 Chronic obstructive pulmonary disease, unspecified: Secondary | ICD-10-CM | POA: Diagnosis not present

## 2019-09-18 DIAGNOSIS — I2510 Atherosclerotic heart disease of native coronary artery without angina pectoris: Secondary | ICD-10-CM | POA: Diagnosis not present

## 2019-09-18 NOTE — Patient Instructions (Signed)
Follow up in 3 months

## 2019-09-18 NOTE — Progress Notes (Signed)
[facility]  Chief Complaint  Patient presents with  . Follow-up    shortness of breath after getting out of car and eating    Constitutional:  BP (!) 126/58 (BP Location: Right Arm, Cuff Size: Normal)   Pulse 76   Temp 97.9 F (36.6 C) (Oral)   Ht 5\' 5"  (1.651 m)   Wt 222 lb 6.4 oz (100.9 kg)   SpO2 96%   BMI 37.01 kg/m   Past Medical History:  Depression, Hypothyroidism, HTN, Diastolic CHF, HLD, DM, GERD, Peripheral neuropathy  Brief Summary:  Alexandria [name] is a 84 y.o. female with dyspnea from COPD, diastolic CHF, deconditioning, and iron deficiency anemia.  Alexandria [name] also has hx of OSA, but intolerant of CPAP.  Subjective:  Alexandria [name] is scheduled to start XRT next month.  Alexandria [name] is very nervous about this.  Breathing has been okay.  Not having much cough, wheeze, or sputum.  Has constant runny nose.    Feels supplemental oxygen is helping.  Has redness in her right lower leg.  Has been seen by dermatology.  When Alexandria [name] uses prescribed cream this helps.  Physical Exam:   Appearance - well kempt, wearing oxygen  ENMT - no sinus tenderness, no oral exudate, no LAN, Mallampati 3 airway, no stridor  Respiratory - equal breath sounds bilaterally, no wheezing or rales  CV - s1s2 regular rate and rhythm, no murmurs  Ext - redness right lower leg  Skin - no rashes  Psych - normal mood and affect   Assessment/Plan:   Rt upper lung nodule. - presumptive diagnosis of lung cancer - too high risk for biopsy, and not surgical candidate - Alexandria [name] will start SBRT in August 2021  COPD with emphysema and chronic bronchitis. - continue symbicort and prn abluterol  Chronic respiratory failure with hypoxia. - continue 2 liters oxygen 24/7  Allergic rhinitis. - continue allegra, fluticasone  Rt lower leg rash. - Alexandria [name] will follow up with dermatology  A total of  22 minutes spent addressing patient care issues on day of visit.   Follow up:    Patient Instructions  Follow up in 3 months   Signature:  Chesley Mires, MD [place] Pager: [phone] 09/18/2019, 3:04 PM  Flow Sheet     Pulmonary tests:   RAST 10/02/10 >> negative, IgE 8.1  A1AT 04/15/11 >> MM  PFT 10/512 >> FEV1 1.29 (61%), FEV1% 57, TLC 6.01 (119%), RV 3.52 (153%), DLCO 67%, no BD  PFT 08/15/14 >> FEV1 1.07 (57%), FEV1% 55, TLC 6.49 (128%), DLCO 47%, no BD  Chest Imaging:   CT chest 06/29/16 >> atherosclerosis, RUL nodule 7 mm (was 8 mm), no change 6 mm LUL nodule, no change 4 mm RLL nodule   CT chest 12/23/17 >> 10 mm RUL nodule, 7 mm RUL nodule, 4 mm RLL nodule, 6 mm LUL nodule, centrilobular emphysema  PET scan 01/06/18 >> RUL nodule not hypermetabolic  CT chest 04/20/06 >> atherosclerosis, moderate centrilobular emphysema with diffuse bronchial wall thickening, 2.4 cm posterior apical irregular nodule increased from 2.3 cm, scar medial RLL, scattered nodules up to 4 mm in medial RLL, 6 mm nodule in LLL  CT chest 12/14/18 >> no change RUL nodule  CT chest 06/13/19 >> 2.2 x 1.2 cm RUL nodule (was 2.1 x 1.0 cm from October 2020), 0.4 cm nodule LLL  Sleep tests:   PSG 01/27/10 >> AHI 13, SpO2 low 73%  ONO with CPAP 08/09/14 >> Test  time 9 hr 16 min. Mean SpO2 92.2%, low SpO2 86%. Spent 10 min with SpO2 < 88%  ONO with CPAP 06/24/16 >>test time 7 hrs 29 min. Average SpO2 90%, low SpO2 79%. Spent 58 min with SpO2 <88%.  ONO with RA 11/01/16 >> test time 6 hrs 43 min.  Average SpO2 89%, low SpO2 67%.  Spent 2 hrs 7 min with SpO2 < 88%.  ONO with 2 liters 07/18/18 >> test time 8 hrs 34 min.  Baseline SpO2 94%, low SpO2 69%.  Spent 38 min with SpO2 < 88%.   Cardiac tests:   Echo 09/28/14 >> mild LVH, EF 55 to 60%  Medications:   Allergies as of 09/18/2019      Reactions   Antihistamines, Diphenhydramine-type Other (See Comments)   Able to tolerate only allegra.    Sulfonamide Derivatives Swelling   REACTION: closed  throat   Incruse Ellipta [umeclidinium Bromide] Cough   Caused voice change and severe coughing   Clarithromycin Other (See Comments)   REACTION: diff swallowing and mouth blisters   Codeine Other (See Comments)   Unknown    Lyrica [pregabalin] Other (See Comments)   Lack of effect for neuropathy pain.     Spiriva Handihaler [tiotropium Bromide Monohydrate] Other (See Comments)   Voice changes      Medication List       Accurate as of September 18, 2019  3:03 PM. If you have any questions, ask your nurse or doctor.        STOP taking these medications   Azelastine-Fluticasone 137-50 MCG/ACT Susp Stopped by: Chesley Mires, MD   cephALEXin 250 MG capsule Commonly known as: Keflex Stopped by: Chesley Mires, MD     TAKE these medications   albuterol 108 (90 Base) MCG/ACT inhaler Commonly known as: ProAir HFA Inhale 2 puffs into the lungs every 6 (six) hours as needed for wheezing or shortness of breath. INHALE 2 PUFFS INTO THE LUNGS EVERY 6 HOURS AS NEEDED FOR WHEEZING OR SHORTNESS OF BREATH   B-12 5000 MCG Caps Take 5,000 mcg by mouth daily.   budesonide-formoterol 160-4.5 MCG/ACT inhaler Commonly known as: Symbicort Inhale 2 puffs into the lungs 2 (two) times daily.   busPIRone 5 MG tablet Commonly known as: BUSPAR 5 mg 3 (three) times daily. As needed   Coenzyme Q-10 200 MG Caps Take 200 mg by mouth daily.   EpiPen 0.3 mg/0.3 mL Devi Generic drug: EPINEPHrine Inject 0.3 mg into the muscle once as needed (anaphylaxis).   esomeprazole 40 MG capsule Commonly known as: NEXIUM Take 40 mg by mouth every other day.   ezetimibe 10 MG tablet Commonly known as: ZETIA TAKE 1 TABLET BY MOUTH EVERYDAY AT BEDTIME   ferrous sulfate 325 (65 FE) MG EC tablet Take 1 tablet (325 mg total) by mouth daily with breakfast.   fexofenadine 180 MG tablet Commonly known as: ALLEGRA Take 180 mg by mouth as needed.   FLUoxetine 20 MG capsule Commonly known as: PROZAC Take 20 mg by mouth  2 (two) times daily.   fluticasone 50 MCG/ACT nasal spray Commonly known as: FLONASE USE 2 SPRAYS INTO THE NOSE EVERY 12 HOURS AS NEEDED FOR STUFFY NOSE   furosemide 20 MG tablet Commonly known as: LASIX Take 1 tablet (20 mg total) by mouth daily as needed for fluid.   glucose blood test strip Commonly known as: OneTouch Verio Use as instructed to check sugar 2-3x time daily   OneTouch Verio test strip Generic drug:  glucose blood USE AS INSTRUCTED TO CHECK SUGAR 1 TIME DAILY   metFORMIN 500 MG tablet Commonly known as: GLUCOPHAGE Take 1 tablet (500 mg total) by mouth daily with supper.   nitroGLYCERIN 0.4 MG SL tablet Commonly known as: NITROSTAT Place 1 tablet (0.4 mg total) under the tongue every 5 (five) minutes as needed for chest pain.   OneTouch Delica Lancets Fine Misc USE TO CHECK SUGAR 1 TIME DAILY   potassium chloride 10 MEQ tablet Commonly known as: KLOR-CON Take 1 tablet (10 mEq total) by mouth daily as needed (Takes with furosemide doses.).   rosuvastatin 10 MG tablet Commonly known as: CRESTOR TAKE 1 TABLET (10 MG TOTAL) BY MOUTH EVERY OTHER DAY.   Spacer/Aero Chamber [employer] Use with  inhaler as needed.  J44.9   Synthroid 137 MCG tablet Generic drug: levothyroxine Take 1 tablet (137 mcg total) by mouth daily before breakfast.   valsartan 160 MG tablet Commonly known as: DIOVAN TAKE ONE HALF TABLET (80 MG TOTAL) 2 TIMES DAILY.   VITAMIN A PO Take 600 mcg by mouth.   VITAMIN D (CHOLECALCIFEROL) PO Take 15 mg by mouth.   Vitamin D 50 MCG (2000 UT) tablet Take 1 tablet (2,000 Units total) by mouth daily.   ZINC PO Take by mouth.       Past Surgical History:  Alexandria [name]  has a past surgical history that includes Tonsillectomy; Rotator cuff repair (1984); Cesarean section; Total abdominal hysterectomy (1985); Cholecystectomy (1997); THUMB RELEASE (12/1997); Carpal tunnel release (12/1997); Esophagogastroduodenoscopy (12/05/1997); ABD U/S  (03/19/1999); CT ABD W & PELVIS WO/W CM; CARDIOLITE PERSANTINE (08/24/2000); CAROTID U/S (08/24/2000); DEXA (07/03/2003); ADENOSINE MYOVIEW (06/02/2007); CAROTID U/S (06/02/2007); US ECHOCARDIOGRAPHY (06/02/2007); Hernia repair (01/24/2009); knee arthroscopic surgery (years ago); Shoulder open rotator cuff repair (02/08/2012); Gallbladder surgery; Dental surgery (2016); Colonoscopy with propofol (N/A, 02/12/2016); and Esophagogastroduodenoscopy (egd) with propofol (N/A, 02/12/2016).  Family History:  Her family history includes Cystic fibrosis in her sister; Emphysema in her father; Heart disease in her mother; Hyperthyroidism in her brother and sister; Osteoarthritis in her brother; Thyroid disease in her mother.  Social History:  Alexandria [name]  reports that Alexandria [name] quit smoking about 14 years ago. Her smoking use included cigarettes. Alexandria [name] has a 100.00 pack-year smoking history. Alexandria [name] has never used smokeless tobacco. Alexandria [name] reports current alcohol use. Alexandria [name] reports that Alexandria [name] does not use drugs.

## 2019-09-25 ENCOUNTER — Other Ambulatory Visit: Payer: Self-pay

## 2019-09-25 ENCOUNTER — Telehealth: Payer: Self-pay

## 2019-09-25 ENCOUNTER — Ambulatory Visit: Payer: Medicare Other

## 2019-09-25 NOTE — Telephone Encounter (Signed)
Called patient to complete her Medicare visit and patient stated that she never scheduled this appointment. She did not know anything about it and was unprepared. Stated she would think about it and call us back if she decided to go ahead and have this done. Appointment cancelled per patient request.

## 2019-09-26 NOTE — Progress Notes (Signed)
Thoracic Location of Tumor / Histology: Right Upper Lobe Lung  Patient presented for routine scans for a nodule that has been monitored for a few years.  PET 07/18/2019: Increased metabolic activity in the area of the part solid nodule in the RIGHT upper lobe. Findings are suspicious for indolent bronchogenic neoplasm. Other small nodules in the chest without increased metabolic activity.  CT Chest 06/13/2019: Subsolid 2.2 cm posterior apical right upper lobe pulmonary nodule with 1.0 cm solid component, mildly increased since 12/14/2018 CT, slowly increasing on multiple CT studies back to 2017. Findings are most compatible with a slow growing primary bronchogenic carcinoma. Multidisciplinary thoracic oncology consultation advised.  No thoracic adenopathy. New tiny subpleural 4 mm left lung base solid pulmonary nodule, warranting attention on follow-up chest CT in 3 months.  Biopsies of : to great of a risk for biopsy  Tobacco/Marijuana/Snuff/ETOH use: Former Smoker, quit in 2006.  Dr. Halford Chessman 09/18/2019 Rt upper lung nodule. - presumptive diagnosis of lung cancer - too high risk for biopsy, and not surgical candidate - she will start SBRT in August 2021 06/26/2019 -options at this time discussed are: 1) revisit bronchoscopy with ENB, 2) refer to [place] to discuss option of radiation therapy w/o tissue confirmation of diagnosis, 3) clinical option - she doesn't want to take the risk of bronchoscopy and ENB in relation to anesthesia risk and pneumothorax risk - she would be willing to consider empiric radiation therapy - will therefore refer her [facility] to [facility] for review to determine if she is a candidate for empiric radiation therapy  Past/Anticipated interventions by cardiothoracic surgery, if any:  -Not a good candidate for surgery.   Past/Anticipated interventions by medical oncology, if any:  -Saw Dr. Julien Nordmann in [place]   Signs/Symptoms  Weight changes, if any: No  Respiratory  complaints, if any: Wears 2 liters of oxygen, since about 2017.  Has SOB.  Hemoptysis, if any: Has dry cough, no blood noted.  Pain issues, if any:  No  SAFETY ISSUES:  Prior radiation? No  Pacemaker/ICD? No  Possible current pregnancy? Hysterectomy  Is the patient on methotrexate? No  Current Complaints / other details:

## 2019-09-27 ENCOUNTER — Ambulatory Visit
Admission: RE | Admit: 2019-09-27 | Discharge: 2019-09-27 | Disposition: A | Discharge status: Home / Self Care | Payer: Medicare Other | Source: Ambulatory Visit | Attending: Radiation Oncology | Admitting: Radiation Oncology

## 2019-09-27 ENCOUNTER — Other Ambulatory Visit: Payer: Self-pay

## 2019-09-27 ENCOUNTER — Encounter: Payer: Self-pay | Admitting: Radiation Oncology

## 2019-09-27 VITALS — BP 137/52 | HR 81 | Temp 98.4°F | Resp 18 | Ht 65.0 in | Wt 218.4 lb

## 2019-09-27 DIAGNOSIS — G4730 Sleep apnea, unspecified: Secondary | ICD-10-CM | POA: Diagnosis not present

## 2019-09-27 DIAGNOSIS — J449 Chronic obstructive pulmonary disease, unspecified: Secondary | ICD-10-CM | POA: Insufficient documentation

## 2019-09-27 DIAGNOSIS — I5032 Chronic diastolic (congestive) heart failure: Secondary | ICD-10-CM | POA: Diagnosis not present

## 2019-09-27 DIAGNOSIS — Z9981 Dependence on supplemental oxygen: Secondary | ICD-10-CM | POA: Diagnosis not present

## 2019-09-27 DIAGNOSIS — E1136 Type 2 diabetes mellitus with diabetic cataract: Secondary | ICD-10-CM | POA: Diagnosis not present

## 2019-09-27 DIAGNOSIS — E669 Obesity, unspecified: Secondary | ICD-10-CM | POA: Diagnosis not present

## 2019-09-27 DIAGNOSIS — F419 Anxiety disorder, unspecified: Secondary | ICD-10-CM | POA: Diagnosis not present

## 2019-09-27 DIAGNOSIS — Z87891 Personal history of nicotine dependence: Secondary | ICD-10-CM | POA: Insufficient documentation

## 2019-09-27 DIAGNOSIS — C3411 Malignant neoplasm of upper lobe, right bronchus or lung: Secondary | ICD-10-CM | POA: Insufficient documentation

## 2019-09-27 DIAGNOSIS — E1142 Type 2 diabetes mellitus with diabetic polyneuropathy: Secondary | ICD-10-CM | POA: Diagnosis not present

## 2019-09-27 DIAGNOSIS — Z7984 Long term (current) use of oral hypoglycemic drugs: Secondary | ICD-10-CM | POA: Diagnosis not present

## 2019-09-27 DIAGNOSIS — F329 Major depressive disorder, single episode, unspecified: Secondary | ICD-10-CM | POA: Diagnosis not present

## 2019-09-27 DIAGNOSIS — E785 Hyperlipidemia, unspecified: Secondary | ICD-10-CM | POA: Insufficient documentation

## 2019-09-27 DIAGNOSIS — I110 Hypertensive heart disease with heart failure: Secondary | ICD-10-CM | POA: Diagnosis not present

## 2019-09-27 DIAGNOSIS — E039 Hypothyroidism, unspecified: Secondary | ICD-10-CM | POA: Diagnosis not present

## 2019-09-27 DIAGNOSIS — Z7951 Long term (current) use of inhaled steroids: Secondary | ICD-10-CM | POA: Insufficient documentation

## 2019-09-27 DIAGNOSIS — Z79899 Other long term (current) drug therapy: Secondary | ICD-10-CM | POA: Diagnosis not present

## 2019-09-29 NOTE — Progress Notes (Signed)
Radiation Oncology         (336) [phone] ________________________________   Name: BENNYE [name]        MRN: [id number]  Date of Service: 09/27/2019 DOB: 1935/10/19  EZ:MOQHUT, Alexandria Rising, MD  Alexandria Nash, DO     REFERRING PHYSICIAN: Garner Nash, DO   DIAGNOSIS: The encounter diagnosis was Malignant neoplasm of right upper lobe of lung ([place]).   HISTORY OF PRESENT ILLNESS: Alexandria [name] is a 84 y.o. female originally seen at the request of Dr. Halford [name] for a probable stage I lung cancer. The patient has a history of COPD and has been oxygen dependant at 2-3 L Alexandria [name] for the last 3-4 years. She has been followed as well with nodules in both lungs since about 2017. She had recent imaging of the chest of 06/13/19 that revealed an increase in size in the dominant lesion in the apical RUL. This measured 2.2 x 1.2 cm and this was a few millimeters larger than in October 2020. The solid component was 1 cm, and had previously been 8 mm. She also has stable LLL nodule measuring 4 mm, and a 4 mm nodule in the RLL as well that was stable. PET imaging on 07/18/19 did reveal increased metabolic activity in the RUL lesion with an SUV of 2.2 despite blood pool of 3.32. She was counseled on the option of bronchoscopy for diagnosis of the presumed early stage lung cancer. She is not as interested in having any invasive procedures, or surgery. She was contacted by [place] in mid May 2021 and was offered  stereotactic body radiotherapy (SBRT) for her putative stage I lung cancer. She wanted to consider her options and agreed to proceed a few weeks after we originally spoke. She desired to wait for treatment until August, when she would have more family near her to be able to help her if she needed help due to treatment. She's seen today in clinic to review this treatment and proceed with simulation.  PREVIOUS RADIATION THERAPY: No   PAST MEDICAL HISTORY:  Past Medical History:  Diagnosis Date   Allergy, unspecified  not elsewhere classified    Anxiety    Cataract    Chronic diastolic congestive heart failure (HCC)    COPD (chronic obstructive pulmonary disease) ([place]) 03/08/1998   PFTs 12/12/2002 FEV 1 1.42 (64%) ratio 58 with no better after B2 and DLCO75%; PFTs 11/20/09 FEV1 1.50 (73%) ratio 50 no better after B2 with DLCO 62%; Hfa 50% 11/20/2009 >75%, 01/13/10 p coaching   Depression 12/07/1998   Diabetes mellitus type II 02/05/2006   Dr. Cruzita Lederer with endo   Disorders of bursae and tendons in shoulder region, unspecified    Rotator cuff syndrome, right   E. coli bacteremia    Esophagitis    GERD (gastroesophageal reflux disease)    History of UTI    Hyperlipidemia 10/04/2000   Hypertension 03/08/1992   Hypothyroidism 03/08/1968   Iron deficiency anemia    Lung nodule    radiation starts 10-08-2019   Malignant neoplasm of right upper lobe of lung ([place]) 07/24/2019   Obesity    NOS   OSA (obstructive sleep apnea)    PSG 01/27/10 AHI 13, pt does not know CPAP settings   Peripheral neuropathy    Likely due to DM per Dr. Murvin Natal hernia        PAST SURGICAL HISTORY: Past Surgical History:  Procedure Laterality Date   ABD U/S  03/19/1999  Nml x2 foci in liver   ADENOSINE MYOVIEW  06/02/2007   Nml   CARDIOLITE PERSANTINE  08/24/2000   Nml   CAROTID U/S  08/24/2000   1-39% ICA stenosis   CAROTID U/S  06/02/2007   No apprec change    CARPAL TUNNEL RELEASE  12/1997   Right   CESAREAN SECTION     x2 Bree[place]/ repeat   CHOLECYSTECTOMY  1997   COLONOSCOPY WITH PROPOFOL N/A 02/12/2016   Procedure: COLONOSCOPY WITH PROPOFOL;  Surgeon: Milus Banister, MD;  Location: [facility];  Service: Endoscopy;  Laterality: N/A;   CT ABD W & PELVIS WO/W CM     Abd hemangiomas of liver, 1 cm R renal cyst   DENTAL SURGERY  2016   Implants   DEXA  07/03/2003   Nml   ESOPHAGOGASTRODUODENOSCOPY  12/05/1997   Nml (due to hoarseness)   ESOPHAGOGASTRODUODENOSCOPY (EGD)  WITH PROPOFOL N/A 02/12/2016   Procedure: ESOPHAGOGASTRODUODENOSCOPY (EGD) WITH PROPOFOL;  Surgeon: Milus Banister, MD;  Location: [facility];  Service: Endoscopy;  Laterality: N/A;   GALLBLADDER SURGERY     HERNIA REPAIR  01/24/2009   Lap Ventr w/ Lysis of adhesions (Dr. Donne Hazel)   knee arthroscopic surgery  years ago   right   ROTATOR CUFF REPAIR  1984   Right, [place]   SHOULDER OPEN ROTATOR CUFF REPAIR  02/08/2012   Procedure: ROTATOR CUFF REPAIR SHOULDER OPEN;  Surgeon: Magnus Sinning, MD;  Location: [facility];  Service: Orthopedics;  Laterality: Left;  Left Shoulder Open Anterior Acrominectomy Rotator Cuff Repair Open Distal Clavicle Resection ,tissue mend graft, and repair of biceps tendon   THUMB RELEASE  12/1997   Right   TONSILLECTOMY     TOTAL ABDOMINAL HYSTERECTOMY  1985   Due to dysmennorhea   US ECHOCARDIOGRAPHY  06/02/2007     FAMILY HISTORY:  Family History  Problem Relation Age of Onset   Heart disease Mother    Thyroid disease Mother    Emphysema Father        One lung   Cystic fibrosis Sister    Hyperthyroidism Sister    Osteoarthritis Brother    Hyperthyroidism Brother    Esophageal cancer Neg Hx    Cancer Neg Hx        Head or neck   Colon cancer Neg Hx    Stomach cancer Neg Hx    Breast cancer Neg Hx      SOCIAL HISTORY:  reports that she quit smoking about 14 years ago. Her smoking use included cigarettes. She has a 100.00 pack-year smoking history. She has never used smokeless tobacco. She reports current alcohol use. She reports that she does not use drugs. The patient is married and lives in [place]. She is accompanied by her daughter Lavella Lemons who lives in [place].   ALLERGIES: Antihistamines, diphenhydramine-type; Sulfonamide derivatives; Incruse ellipta [umeclidinium bromide]; Clarithromycin; Codeine; Lyrica [pregabalin]; and Spiriva handihaler [tiotropium bromide monohydrate]   MEDICATIONS:  Current Outpatient  Medications  Medication Sig Dispense Refill   albuterol (PROAIR HFA) 108 (90 Base) MCG/ACT inhaler Inhale 2 puffs into the lungs every 6 (six) hours as needed for wheezing or shortness of breath. INHALE 2 PUFFS INTO THE LUNGS EVERY 6 HOURS AS NEEDED FOR WHEEZING OR SHORTNESS OF BREATH 3 Inhaler 3   budesonide-formoterol (SYMBICORT) 160-4.5 MCG/ACT inhaler Inhale 2 puffs into the lungs 2 (two) times daily. 3 Inhaler 3   busPIRone (BUSPAR) 5 MG tablet 5 mg 3 (three) times daily. As needed  Cholecalciferol (VITAMIN D) 50 MCG (2000 UT) tablet Take 1 tablet (2,000 Units total) by mouth daily.     Coenzyme Q-10 200 MG CAPS Take 200 mg by mouth daily.      Cyanocobalamin (B-12) 5000 MCG CAPS Take 5,000 mcg by mouth daily.     EPINEPHrine (EPIPEN) 0.3 mg/0.3 mL DEVI Inject 0.3 mg into the muscle once as needed (anaphylaxis).      esomeprazole (NEXIUM) 40 MG capsule Take 40 mg by mouth every other day.      ezetimibe (ZETIA) 10 MG tablet TAKE 1 TABLET BY MOUTH EVERYDAY AT BEDTIME 90 tablet 1   ferrous sulfate 325 (65 FE) MG EC tablet Take 1 tablet (325 mg total) by mouth daily with breakfast.     fexofenadine (ALLEGRA) 180 MG tablet Take 180 mg by mouth as needed.      FLUoxetine (PROZAC) 20 MG capsule Take 20 mg by mouth 2 (two) times daily.     fluticasone (FLONASE) 50 MCG/ACT nasal spray USE 2 SPRAYS INTO THE NOSE EVERY 12 HOURS AS NEEDED FOR STUFFY NOSE 48 g 2   glucose blood (ONETOUCH VERIO) test strip Use as instructed to check sugar 2-3x time daily 200 each 5   metFORMIN (GLUCOPHAGE) 500 MG tablet Take 1 tablet (500 mg total) by mouth daily with supper. 90 tablet 3   Multiple Vitamins-Minerals (ZINC PO) Take by mouth.     nitroGLYCERIN (NITROSTAT) 0.4 MG SL tablet Place 1 tablet (0.4 mg total) under the tongue every 5 (five) minutes as needed for chest pain. 25 tablet 3   ONETOUCH DELICA LANCETS FINE MISC USE TO CHECK SUGAR 1 TIME DAILY 100 each 5   ONETOUCH VERIO test strip  USE AS INSTRUCTED TO CHECK SUGAR 1 TIME DAILY 100 each 8   rosuvastatin (CRESTOR) 10 MG tablet TAKE 1 TABLET (10 MG TOTAL) BY MOUTH EVERY OTHER DAY. 45 tablet 2   Spacer/Aero Chamber Mouthpiece MISC Use with  inhaler as needed.  J44.9 1 each 0   SYNTHROID 137 MCG tablet Take 1 tablet (137 mcg total) by mouth daily before breakfast. 90 tablet 2   valsartan (DIOVAN) 160 MG tablet TAKE ONE HALF TABLET (80 MG TOTAL) 2 TIMES DAILY. 90 tablet 1   VITAMIN A PO Take 600 mcg by mouth.     VITAMIN D, CHOLECALCIFEROL, PO Take 15 mg by mouth.     furosemide (LASIX) 20 MG tablet Take 1 tablet (20 mg total) by mouth daily as needed for fluid. 90 tablet 1   potassium chloride (K-DUR) 10 MEQ tablet Take 1 tablet (10 mEq total) by mouth daily as needed (Takes with furosemide doses.). 90 tablet 1   No current facility-administered medications for this encounter.     REVIEW OF SYSTEMS: On review of systems, the patient reports that she is doing fairly well. She continues with O2 at 2-3L [place]. She does not verbalize any new symptoms of concern.    PHYSICAL EXAM:  Wt Readings from Last 3 Encounters:  09/27/19 (!) 218 lb 6.4 oz (99.1 kg)  09/18/19 222 lb 6.4 oz (100.9 kg)  08/30/19 222 lb (100.7 kg)   In general this is a well appearing elderly caucasian female in no acute distress. She's alert and oriented x4 and appropriate throughout the examination. Cardiopulmonary assessment is negative for acute distress and she exhibits normal effort.    ECOG = 0 0 - Asymptomatic (Fully active, able to carry on all predisease activities without restriction)  1 -  Symptomatic but completely ambulatory (Restricted in physically strenuous activity but ambulatory and able to carry out work of a light or sedentary nature. For example, light housework, office work)  2 - Symptomatic, <50% in bed during the day (Ambulatory and capable of all self care but unable to carry out any work activities. Up and about more than  50% of waking hours)  3 - Symptomatic, >50% in bed, but not bedbound (Capable of only limited self-care, confined to bed or chair 50% or more of waking hours)  4 - Bedbound (Completely disabled. Cannot carry on any self-care. Totally confined to bed or chair)  5 - Death   Eustace Pen MM, Creech RH, Tormey DC, et al. [phone]). "Toxicity and response criteria of the [facility]". [place] Oncol. 5 (6): 649-55    LABORATORY DATA:  Lab Results  Component Value Date   WBC 6.7 08/30/2019   HGB 12.1 08/30/2019   HCT 36.2 08/30/2019   MCV 93.6 08/30/2019   PLT 201.0 08/30/2019   Lab Results  Component Value Date   NA 142 06/04/2019   K 4.4 06/04/2019   CL 108 06/04/2019   CO2 31 06/04/2019   Lab Results  Component Value Date   ALT 11 06/04/2019   AST 13 06/04/2019   ALKPHOS 71 06/04/2019   BILITOT 0.4 06/04/2019      RADIOGRAPHY: No results found.     IMPRESSION/PLAN: 1. Putative Stage IA3, cT1cN0M0, NSCLC of the RUL. Dr. Lisbeth Renshaw reviews the nature of early stage lung cancer. We again reviewed the limitations of not having tissue confirmation of disease, but that her imaging findings are consistent with malignancy and that it would be reasonable to proceed with treatment. Dr. Lisbeth Renshaw discusses that in those who are not surgical candidates, Stereotatic body radiotherapy (SBRT) would be an acceptable alternative. We discussed the risks, benefits, short, and long term effects of radiotherapy, and the patient is interested in proceeding. Dr. Lisbeth Renshaw discusses the delivery and logistics of radiotherapy and recommends a course of 3-5 fractions of radiotherapy. Written consent is obtained and placed in the chart, a copy was provided to the patient. She will simulate today.   In a visit lasting 60 minutes, greater than 50% of the time was spent face to face discussing the patient's condition, in preparation for the discussion, and coordinating the patient's care.    The  above documentation reflects my direct findings during this shared patient visit. Please see the separate note by Dr. Lisbeth Renshaw on this date for the remainder of the patient's plan of care.    Carola Rhine, PAC

## 2019-10-03 DIAGNOSIS — C3411 Malignant neoplasm of upper lobe, right bronchus or lung: Secondary | ICD-10-CM | POA: Diagnosis not present

## 2019-10-11 ENCOUNTER — Other Ambulatory Visit: Payer: Self-pay

## 2019-10-11 ENCOUNTER — Ambulatory Visit
Admission: RE | Admit: 2019-10-11 | Discharge: 2019-10-11 | Disposition: A | Discharge status: Home / Self Care | Payer: Medicare Other | Source: Ambulatory Visit | Attending: Radiation Oncology | Admitting: Radiation Oncology

## 2019-10-11 DIAGNOSIS — C3411 Malignant neoplasm of upper lobe, right bronchus or lung: Secondary | ICD-10-CM | POA: Diagnosis not present

## 2019-10-15 ENCOUNTER — Other Ambulatory Visit: Payer: Self-pay | Admitting: Radiation Oncology

## 2019-10-15 ENCOUNTER — Telehealth: Payer: Self-pay | Admitting: *Deleted

## 2019-10-15 DIAGNOSIS — R062 Wheezing: Secondary | ICD-10-CM

## 2019-10-15 MED ORDER — LORAZEPAM 0.5 MG PO TABS: ORAL_TABLET | ORAL | 0 refills | Status: DC

## 2019-10-15 NOTE — Telephone Encounter (Signed)
Left patient a voicemail letting her know that we sent in her prescription to [facility] on [address].  She is to take the medication about 30 minutes before her radiation treatments.  2 tablets were called in as she only has 2 treatments left.  Call back number left in the event she has questions.  Will continue to follow as necessary.  Gloriajean Dell. Leonie Green, BSN

## 2019-10-16 ENCOUNTER — Ambulatory Visit
Admission: RE | Admit: 2019-10-16 | Discharge: 2019-10-16 | Disposition: A | Discharge status: Home / Self Care | Payer: Medicare Other | Source: Ambulatory Visit | Attending: Radiation Oncology | Admitting: Radiation Oncology

## 2019-10-16 DIAGNOSIS — C3411 Malignant neoplasm of upper lobe, right bronchus or lung: Secondary | ICD-10-CM | POA: Diagnosis not present

## 2019-10-18 ENCOUNTER — Other Ambulatory Visit: Payer: Self-pay

## 2019-10-18 ENCOUNTER — Ambulatory Visit
Admission: RE | Admit: 2019-10-18 | Discharge: 2019-10-18 | Disposition: A | Discharge status: Home / Self Care | Payer: Medicare Other | Source: Ambulatory Visit | Attending: Radiation Oncology | Admitting: Radiation Oncology

## 2019-10-18 ENCOUNTER — Encounter: Payer: Self-pay | Admitting: Radiation Oncology

## 2019-10-18 DIAGNOSIS — C3411 Malignant neoplasm of upper lobe, right bronchus or lung: Secondary | ICD-10-CM

## 2019-10-24 ENCOUNTER — Encounter: Payer: Self-pay | Admitting: Family Medicine

## 2019-10-30 NOTE — Progress Notes (Signed)
  Radiation Oncology         (336) [phone] ________________________________  Name: Alexandria [name] MRN: [id number]  Date: 10/18/2019  DOB: [birth year]  End of Treatment Note  Diagnosis:   stage I non-small cell lung cancer   Indication for treatment::  curative       Radiation treatment dates:   10/11/19 - 10/18/19  Site/dose:   The patient was treated to the right lung with a course of stereotactic body radiation treatment.  The patient received 54 Gray in 3 fractions using a IMRT/SBRT technique, with 3 fields.  Narrative: The patient tolerated radiation treatment relatively well.   No unexpected difficulties.  The patient's breathing did not significantly change during the course of the treatment.  Plan: The patient has completed radiation treatment. The patient will return to radiation oncology clinic for routine followup in one month. I advised the patient to call or return sooner if they have any questions or concerns related to their recovery or treatment. ________________________________  Jodelle Gross, M.D., Ph.D.

## 2019-10-31 ENCOUNTER — Encounter: Payer: Self-pay | Admitting: Internal Medicine

## 2019-10-31 ENCOUNTER — Other Ambulatory Visit: Payer: Self-pay

## 2019-10-31 ENCOUNTER — Ambulatory Visit (INDEPENDENT_AMBULATORY_CARE_PROVIDER_SITE_OTHER): Payer: Medicare Other | Admitting: Internal Medicine

## 2019-10-31 VITALS — BP 148/60 | HR 78 | Ht 65.0 in | Wt 229.0 lb

## 2019-10-31 DIAGNOSIS — E785 Hyperlipidemia, unspecified: Secondary | ICD-10-CM | POA: Diagnosis not present

## 2019-10-31 DIAGNOSIS — I2510 Atherosclerotic heart disease of native coronary artery without angina pectoris: Secondary | ICD-10-CM | POA: Diagnosis not present

## 2019-10-31 DIAGNOSIS — E1140 Type 2 diabetes mellitus with diabetic neuropathy, unspecified: Secondary | ICD-10-CM | POA: Diagnosis not present

## 2019-10-31 DIAGNOSIS — E039 Hypothyroidism, unspecified: Secondary | ICD-10-CM | POA: Diagnosis not present

## 2019-10-31 LAB — POCT GLYCOSYLATED HEMOGLOBIN (HGB A1C): Hemoglobin A1C: 5.3 % (ref 4.0–5.6)

## 2019-10-31 LAB — TSH: TSH: 9.48 u[IU]/mL — ABNORMAL HIGH (ref 0.35–4.50)

## 2019-10-31 LAB — T4, FREE: Free T4: 0.99 ng/dL (ref 0.60–1.60)

## 2019-10-31 NOTE — Addendum Note (Signed)
Addended by: Kaylyn Lim I on: 10/31/2019 11:25 AM   Modules accepted: Orders

## 2019-10-31 NOTE — Progress Notes (Addendum)
Patient ID: Alexandria [name], female   DOB: 07/03/1935, 84 y.o.   MRN: [id number]  This visit occurred during the SARS-CoV-2 public health emergency.  Safety protocols were in place, including screening questions prior to the visit, additional usage of staff PPE, and extensive cleaning of exam room while observing appropriate contact time as indicated for disinfecting solutions.    HPI: Alexandria [name] is a 84 y.o.-year-old female, returning for follow-up for DM2, dx in 2006, non-insulin-dependent, controlled, with complications (mild CKD) and uncontrolled hypothyroidism. Last visit 6 months ago.  Since last OV, she was dx'ed with lung cancer. She had 3 RxTx sessions >> will have new evaluation at the end of the month.  She otherwise feels very well, and has plenty of energy.  She is remodeling her house >> building a porch and an Media planner.  DM2: The HbA1c levels: Lab Results  Component Value Date   HGBA1C 5.4 05/02/2019   HGBA1C 5.2 11/01/2018   HGBA1C 5.5 06/20/2018   Pt is on: - Metformin 500 mg with dinner She was on Januvia >> stopped in summer 2016, when she was admitted for sepsis. We did not restart afterwards as her sugars remained controlled.  Pt is checking sugars once a day per review of her meter downloads: - am: 109-135, 146 >> 118, 124 >> 80s-100s >> 116, 122 - 2h after brunch:  124-195 >> n/c >> 175 >> n/c - before lunch: 103, 109 - before dinner: 132-155 >> n/c >> 114-152 >> n/c >> 125 - 2h after dinner: 135-188, 336 (fruit cake) >> ... >> 130s >> 96-158 - bedtime: 124, 147 >> n/c >> 133 >> ? - nighttime: n/c >> 169, 175 >> n/c Lowest sugar was 96 >> 71 >>114 >> 80 >> 91; she has hypoglycemia awareness in the 60s. Highest sugar was 336 >> 186 (starch) >> 175 (pie) >> 280 >> 158 (sweets)  Glucometer:One Touch Ultra mini  Pt's meals are:  - Breakfast: protein drink, egg, cereals >> cottage cheese, pineapple, deviled egg + tomato or fruit - Lunch: sandwich -  Dinner: meat + 2 veggies - Snacks: 1-3 peanut butter, milk, crackers  -+ Mild CKD, last BUN/creatinine:  Lab Results  Component Value Date   BUN 24 (H) 06/04/2019   CREATININE 0.88 06/04/2019  On valsartan. -+ HL; last set of lipids: Lab Results  Component Value Date   CHOL 148 06/26/2019   HDL 60.00 06/26/2019   LDLCALC 69 06/26/2019   LDLDIRECT 63.0 07/29/2015   TRIG 93.0 06/26/2019   CHOLHDL 2 06/26/2019  On Crestor every other day, Zetia - last eye exam was: 02/2018: No DR, + cataract. She was supposed to have cataract sx, but postponed. Dr. Prudencio Burly. - no numbness and tingling in her feet.  Hypothyroidism. -Uncontrolled  She is on Synthroid d.a.w. 137 mcg daily (dose increased at last visit), taken: - in am - fasting - drinks coffee with sweetener but no dairy - at least 1 hour from b'fast - + Ca, Fe, MVI, PPIs - all >4h after levothyroxine - not on Biotin  Reviewed her TFTs: Lab Results  Component Value Date   TSH 6.56 (H) 08/30/2019   TSH 18.54 (H) 05/02/2019   TSH 41.98 (H) 11/01/2018   TSH 3.83 12/22/2017   TSH 0.80 02/09/2017   TSH 0.93 01/01/2016   TSH 1.75 03/14/2015   TSH 2.83 01/24/2015   TSH 0.18 (L) 12/04/2014   TSH 0.55 10/18/2014   TSH 0.341 (L) 09/29/2014   TSH  0.26 (L) 10/02/2010   TSH 0.34 02/19/2010   TSH 2.90 10/11/2007   TSH 5.56 (H) 01/12/2007   She has a h/o COPD- Dr. Halford Chessman, HTN, anemia, GERD. She fell in 09/2015 >> hurt R leg (had many stitches) and R orbit. She was in rehab afterwards.  ROS: Constitutional: no weight gain/no weight loss, no fatigue, no subjective hyperthermia, no subjective hypothermia Eyes: no blurry vision, no xerophthalmia ENT: no sore throat, no nodules palpated in neck, no dysphagia, no odynophagia, no hoarseness Cardiovascular: no CP/no SOB/no palpitations/+ leg swelling R>L Respiratory: no cough/no SOB/no wheezing Gastrointestinal: no N/no V/no D/no C/no acid reflux Musculoskeletal: no muscle aches/no joint  aches Skin: no rashes, no hair loss Neurological: no tremors/no numbness/no tingling/no dizziness  I reviewed pt's medications, allergies, PMH, social hx, family hx, and changes were documented in the history of present illness. Otherwise, unchanged from my initial visit note.  Past Medical History:  Diagnosis Date  . Allergy, unspecified not elsewhere classified   . Anxiety   . Cataract   . Chronic diastolic congestive heart failure ([place])   . COPD (chronic obstructive pulmonary disease) ([place]) 03/08/1998   PFTs 12/12/2002 FEV 1 1.42 (64%) ratio 58 with no better after B2 and DLCO75%; PFTs 11/20/09 FEV1 1.50 (73%) ratio 50 no better after B2 with DLCO 62%; Hfa 50% 11/20/2009 >75%, 01/13/10 p coaching  . Depression 12/07/1998  . Diabetes mellitus type II 02/05/2006   Dr. Cruzita Lederer with endo  . Disorders of bursae and tendons in shoulder region, unspecified    Rotator cuff syndrome, right  . E. coli bacteremia   . Esophagitis   . GERD (gastroesophageal reflux disease)   . History of UTI   . Hyperlipidemia 10/04/2000  . Hypertension 03/08/1992  . Hypothyroidism 03/08/1968  . Iron deficiency anemia   . Lung nodule    radiation starts 10-08-2019  . Malignant neoplasm of right upper lobe of lung ([place]) 07/24/2019  . Obesity    NOS  . OSA (obstructive sleep apnea)    PSG 01/27/10 AHI 13, pt does not know CPAP settings  . Peripheral neuropathy    Likely due to DM per Dr. Erling Cruz  . Ventral hernia    Past Surgical History:  Procedure Laterality Date  . ABD U/S  03/19/1999   Nml x2 foci in liver  . ADENOSINE MYOVIEW  06/02/2007   Nml  . CARDIOLITE PERSANTINE  08/24/2000   Nml  . CAROTID U/S  08/24/2000   1-39% ICA stenosis  . CAROTID U/S  06/02/2007   No apprec change   . CARPAL TUNNEL RELEASE  12/1997   Right  . CESAREAN SECTION     x2 Breech/ repeat  . CHOLECYSTECTOMY  1997  . COLONOSCOPY WITH PROPOFOL N/A 02/12/2016   Procedure: COLONOSCOPY WITH PROPOFOL;  Surgeon: Milus Banister,  MD;  Location: [facility];  Service: Endoscopy;  Laterality: N/A;  . CT ABD W & PELVIS WO/W CM     Abd hemangiomas of liver, 1 cm R renal cyst  . DENTAL SURGERY  2016   Implants  . DEXA  07/03/2003   Nml  . ESOPHAGOGASTRODUODENOSCOPY  12/05/1997   Nml (due to hoarseness)  . ESOPHAGOGASTRODUODENOSCOPY (EGD) WITH PROPOFOL N/A 02/12/2016   Procedure: ESOPHAGOGASTRODUODENOSCOPY (EGD) WITH PROPOFOL;  Surgeon: Milus Banister, MD;  Location: [facility];  Service: Endoscopy;  Laterality: N/A;  . GALLBLADDER SURGERY    . HERNIA REPAIR  01/24/2009   Lap Ventr w/ Lysis of adhesions (Dr. Donne Hazel)  .  knee arthroscopic surgery  years ago   right  . ROTATOR CUFF REPAIR  1984   Right, [place]  . SHOULDER OPEN ROTATOR CUFF REPAIR  02/08/2012   Procedure: ROTATOR CUFF REPAIR SHOULDER OPEN;  Surgeon: Magnus Sinning, MD;  Location: [facility];  Service: Orthopedics;  Laterality: Left;  Left Shoulder Open Anterior Acrominectomy Rotator Cuff Repair Open Distal Clavicle Resection ,tissue mend graft, and repair of biceps tendon  . THUMB RELEASE  12/1997   Right  . TONSILLECTOMY    . TOTAL ABDOMINAL HYSTERECTOMY  1985   Due to dysmennorhea  . US ECHOCARDIOGRAPHY  06/02/2007   Social History   Occupational History  . Retired - self employed- Engineer, structural    Social History Main Topics  . Smoking status: Former Smoker -- 2.00 packs/day for 50 years    Types: Cigarettes    Quit date: 2007  . Smokeless tobacco: Never Used  . Alcohol Use: 1.2 oz/week    2 Standard drinks or equivalent per week     Comment: occasional  . Drug Use: No  . Sexual Activity: Not on file   Social History Narrative   Married with 2 children   Enjoys painting   Current Outpatient Medications on File Prior to Visit  Medication Sig Dispense Refill  . albuterol (PROAIR HFA) 108 (90 Base) MCG/ACT inhaler Inhale 2 puffs into the lungs every 6 (six) hours as needed for wheezing or shortness of breath. INHALE 2  PUFFS INTO THE LUNGS EVERY 6 HOURS AS NEEDED FOR WHEEZING OR SHORTNESS OF BREATH 3 Inhaler 3  . budesonide-formoterol (SYMBICORT) 160-4.5 MCG/ACT inhaler Inhale 2 puffs into the lungs 2 (two) times daily. 3 Inhaler 3  . busPIRone (BUSPAR) 5 MG tablet 5 mg 3 (three) times daily. As needed    . Cholecalciferol (VITAMIN D) 50 MCG (2000 UT) tablet Take 1 tablet (2,000 Units total) by mouth daily.    . Coenzyme Q-10 200 MG CAPS Take 200 mg by mouth daily.     . Cyanocobalamin (B-12) 5000 MCG CAPS Take 5,000 mcg by mouth daily.    Marland Kitchen EPINEPHrine (EPIPEN) 0.3 mg/0.3 mL DEVI Inject 0.3 mg into the muscle once as needed (anaphylaxis).     Marland Kitchen esomeprazole (NEXIUM) 40 MG capsule Take 40 mg by mouth every other day.     . ezetimibe (ZETIA) 10 MG tablet TAKE 1 TABLET BY MOUTH EVERYDAY AT BEDTIME 90 tablet 1  . ferrous sulfate 325 (65 FE) MG EC tablet Take 1 tablet (325 mg total) by mouth daily with breakfast.    . fexofenadine (ALLEGRA) 180 MG tablet Take 180 mg by mouth as needed.     Marland Kitchen FLUoxetine (PROZAC) 20 MG capsule Take 20 mg by mouth 2 (two) times daily.    . fluticasone (FLONASE) 50 MCG/ACT nasal spray USE 2 SPRAYS INTO THE NOSE EVERY 12 HOURS AS NEEDED FOR STUFFY NOSE 48 g 2  . furosemide (LASIX) 20 MG tablet Take 1 tablet (20 mg total) by mouth daily as needed for fluid. 90 tablet 1  . glucose blood (ONETOUCH VERIO) test strip Use as instructed to check sugar 2-3x time daily 200 each 5  . LORazepam (ATIVAN) 0.5 MG tablet 1 tab po  30 minutes prior to radiation on 10/16/19 and 10/18/19 for claustrophobia 2 tablet 0  . metFORMIN (GLUCOPHAGE) 500 MG tablet Take 1 tablet (500 mg total) by mouth daily with supper. 90 tablet 3  . Multiple Vitamins-Minerals (ZINC PO) Take by mouth.    Marland Kitchen  nitroGLYCERIN (NITROSTAT) 0.4 MG SL tablet Place 1 tablet (0.4 mg total) under the tongue every 5 (five) minutes as needed for chest pain. 25 tablet 3  . ONETOUCH DELICA LANCETS FINE MISC USE TO CHECK SUGAR 1 TIME DAILY 100 each  5  . ONETOUCH VERIO test strip USE AS INSTRUCTED TO CHECK SUGAR 1 TIME DAILY 100 each 8  . potassium chloride (K-DUR) 10 MEQ tablet Take 1 tablet (10 mEq total) by mouth daily as needed (Takes with furosemide doses.). 90 tablet 1  . rosuvastatin (CRESTOR) 10 MG tablet TAKE 1 TABLET (10 MG TOTAL) BY MOUTH EVERY OTHER DAY. 45 tablet 2  . Spacer/Aero Chamber [employer] Use with  inhaler as needed.  J44.9 1 each 0  . SYNTHROID 137 MCG tablet Take 1 tablet (137 mcg total) by mouth daily before breakfast. 90 tablet 2  . valsartan (DIOVAN) 160 MG tablet TAKE ONE HALF TABLET (80 MG TOTAL) 2 TIMES DAILY. 90 tablet 1  . VITAMIN A PO Take 600 mcg by mouth.    Marland Kitchen VITAMIN D, CHOLECALCIFEROL, PO Take 15 mg by mouth.     No current facility-administered medications on file prior to visit.   Allergies  Allergen Reactions  . Antihistamines, Diphenhydramine-Type Other (See Comments)    Able to tolerate only allegra.   . Sulfonamide Derivatives Swelling    REACTION: closed throat  . Incruse Ellipta [Umeclidinium Bromide] Cough    Caused voice change and severe coughing  . Clarithromycin Other (See Comments)    REACTION: diff swallowing and mouth blisters  . Codeine Other (See Comments)    Unknown   . Lyrica [Pregabalin] Other (See Comments)    Lack of effect for neuropathy pain.    Marland Kitchen Spiriva Handihaler [Tiotropium Bromide Monohydrate] Other (See Comments)    Voice changes   Family History  Problem Relation Age of Onset  . Heart disease Mother   . Thyroid disease Mother   . Emphysema Father        One lung  . Cystic fibrosis Sister   . Hyperthyroidism Sister   . Osteoarthritis Brother   . Hyperthyroidism Brother   . Esophageal cancer Neg Hx   . Cancer Neg Hx        Head or neck  . Colon cancer Neg Hx   . Stomach cancer Neg Hx   . Breast cancer Neg Hx    PE: LMP  (LMP Unknown)  There is no height or weight on file to calculate BMI.  Wt Readings from Last 3 Encounters:  09/27/19 (!)  218 lb 6.4 oz (99.1 kg)  09/18/19 222 lb 6.4 oz (100.9 kg)  08/30/19 222 lb (100.7 kg)   Constitutional: overweight, in NAD, on oxygen, walks with a walker Eyes: PERRLA, EOMI, no exophthalmos ENT: moist mucous membranes, no thyromegaly, no cervical lymphadenopathy Cardiovascular: RRR, No RG, +1/6 SEM, + LE edema - R, and also erythema on the right lower leg (chronic) after surgery on right knee Respiratory: CTA B Gastrointestinal: abdomen soft, NT, ND, BS+ Musculoskeletal: no deformities, strength intact in all 4 Skin: moist, warm, no rashes Neurological: no tremor with outstretched hands, DTR normal in all 4  ASSESSMENT: 1. DM2, non-insulin-dependent, controlled, with complications - mild CKD  2. Hypothyroidism  3. HL  PLAN:  1. Patient with longstanding, previously uncontrolled type, on low-dose Metformin only, with excellent HbA1c levels. -At last visit, she was very vague about her blood sugars but the one that she could remember were at goal.  We did not change her regimen at that time.  HbA1c was only slightly higher than before, but still very good, at 5.4%. -At this visit, sugars are almost all at goal, without hypo or hyperglycemia.  We do not need to change her regimen for now. - I advised her to: Patient Instructions  Please continue Synthroid 137 mcg daily.  Take the thyroid hormone every day, with water, at least 30 minutes before breakfast, separated by at least 4 hours from: - acid reflux medications - calcium - iron - multivitamins  Please continue: - Metformin 500 mg with supper  Targets Blood sugars: - before meals: 80-130 - after meals (2h after): <180 - HbA1c <7%  Please stop at the lab.  Please return in 6 months with your sugar log.   - we checked her HbA1c: 5.3% (lower) - advised to check sugars at different times of the day - 1x a day, rotating check times - advised for yearly eye exams >> she is UTD - return to clinic in 6 months  2.  Hypothyroidism -In the past she was not taking levothyroxine consistently -at last visit TSH was 18.  I strongly advised her to take it consistently and we also increase the dose of levothyroxine - latest thyroid labs reviewed with pt >> still elevated but much improved: Lab Results  Component Value Date   TSH 6.56 (H) 08/30/2019   - she continues on LT4 137 mcg daily - pt feels good on this dose, with plenty of energy. - we discussed about taking the thyroid hormone every day, with water, >30 minutes before breakfast, separated by >4 hours from acid reflux medications, calcium, iron, multivitamins. Pt. is taking it correctly.  She mentions that occasionally she is taking the levothyroxine later if she wakes up late, but she is always taking it on empty stomach. - will check thyroid tests today: TSH and fT4 - If labs are abnormal, she will need to return for repeat TFTs in 1.5 months  3. HL -Reviewed latest lipid panel from 06/2019: All fractions at goal:: Lab Results  Component Value Date   CHOL 148 06/26/2019   HDL 60.00 06/26/2019   LDLCALC 69 06/26/2019   LDLDIRECT 63.0 07/29/2015   TRIG 93.0 06/26/2019   CHOLHDL 2 06/26/2019  -Continues Crestor every other day and Zetia, without side effects   Component     Latest Ref Rng & Units 10/31/2019  TSH     0.35 - 4.50 uIU/mL 9.48 (H)  T4,Free(Direct)     0.60 - 1.60 ng/dL 0.99  Hemoglobin A1C     4.0 - 5.6 % 5.3  TSH is higher.  We will advised her to increase the dose of levothyroxine to 150 mcg daily and we will recheck the test in 1.5 months.   Philemon Kingdom, MD PhD [facility] Endocrinology

## 2019-10-31 NOTE — Addendum Note (Signed)
Addended by: Cardell Peach I on: 10/31/2019 11:25 AM   Modules accepted: Orders

## 2019-10-31 NOTE — Patient Instructions (Addendum)
Please continue Synthroid 137 mcg daily.  Take the thyroid hormone every day, with water, at least 30 minutes before breakfast, separated by at least 4 hours from: - acid reflux medications - calcium - iron - multivitamins  Please continue: - Metformin 500 mg with supper  Targets Blood sugars: - before meals: 80-130 - after meals (2h after): <180 - HbA1c <7%  Please stop at the lab.  Please return in 6 months with your sugar log.

## 2019-11-01 MED ORDER — SYNTHROID 150 MCG PO TABS: 150.0000 ug | ORAL_TABLET | Freq: Every day | ORAL | 5 refills | Status: DC

## 2019-11-01 NOTE — Addendum Note (Signed)
Addended by: Philemon Kingdom on: 11/01/2019 04:24 PM   Modules accepted: Orders

## 2019-11-06 DIAGNOSIS — F4323 Adjustment disorder with mixed anxiety and depressed mood: Secondary | ICD-10-CM | POA: Diagnosis not present

## 2019-11-14 ENCOUNTER — Telehealth: Payer: Self-pay | Admitting: Radiation Oncology

## 2019-11-14 NOTE — Telephone Encounter (Signed)
  Radiation Oncology         [phone] ________________________________  Name: Alexandria [name] MRN: [id number]  Date of Service: 11/14/2019  DOB: [birth year]  Post Treatment Telephone Note  Diagnosis:   Putative Stage IA3, cT1cN0M0, NSCLC of the RUL.  Interval Since Last Radiation:  4 weeks    10/11/19 - 10/18/19 SBRT: The patient was treated to the right lung with a course of stereotactic body radiation treatment.  The patient received 54 Gray in 3 fractions using a IMRT/SBRT technique, with 3 fields.  Narrative:  The patient was contacted today for routine follow-up. During treatment she did very well with radiotherapy and did not have significant desquamation. She did have claustrophobia with treatment requiring ativan. She is set up for CT chest on 12/03/19.  Impression/Plan: 1. Putative Stage IA3, cT1cN0M0, NSCLC of the RUL. I was unable to reach the patient by phone today but will follow up with her after her upcoming CT scan on 12/03/19.    Carola Rhine, PAC

## 2019-11-21 ENCOUNTER — Encounter: Payer: Self-pay | Admitting: Family Medicine

## 2019-11-21 ENCOUNTER — Telehealth (INDEPENDENT_AMBULATORY_CARE_PROVIDER_SITE_OTHER): Payer: Medicare Other | Admitting: Family Medicine

## 2019-11-21 ENCOUNTER — Telehealth: Payer: Self-pay | Admitting: *Deleted

## 2019-11-21 VITALS — HR 67 | Temp 97.5°F | Wt 225.0 lb

## 2019-11-21 DIAGNOSIS — J029 Acute pharyngitis, unspecified: Secondary | ICD-10-CM | POA: Diagnosis not present

## 2019-11-21 DIAGNOSIS — I2510 Atherosclerotic heart disease of native coronary artery without angina pectoris: Secondary | ICD-10-CM | POA: Diagnosis not present

## 2019-11-21 DIAGNOSIS — K219 Gastro-esophageal reflux disease without esophagitis: Secondary | ICD-10-CM | POA: Insufficient documentation

## 2019-11-21 DIAGNOSIS — K2100 Gastro-esophageal reflux disease with esophagitis, without bleeding: Secondary | ICD-10-CM

## 2019-11-21 NOTE — Patient Instructions (Signed)
Get your covid test as planned and call us with results   Get back on your nexium Try salt water gargle for sore throat  Also look in mirror with a light to see if throat is read or swollen looking  Watch for fever or other new symptoms (shortness of breath or change in taste or smell)  Tylenol for pain as needed  Drink fluids Avoid the ant spray as much as you can   If no improvement or worse-you may need a strep test in an urgent care clinic Please keep [place] updated

## 2019-11-21 NOTE — Telephone Encounter (Signed)
Patient's daughter Alexandria [name] (on [place]) called stating that her mom had a virtual visit today with Dr. Glori Bickers. Patient's daughter stated that she feels that she needs to have a doctor to listen to her mom's lungs. Alexandria [name] stated that she needs to make sure that her mom does not have pneumonia. Alexandria [name] stated that she can not believe that we are not seeing sick patient's in the office. Patient's daughter stated that her mom has COPD and some upper respiratory symptoms.  Patient's daughter wants to know where she can take her mom to see a doctor. Patient's daughter was given information on the [facility] at [facility] and [place] since they have x-ray equipment if she should need a chest x-ray. Patient's daughter stated that she will take her mom to an Urgent Care if she can convince her mom to go.

## 2019-11-21 NOTE — Assessment & Plan Note (Signed)
With a h/o esophagitis in the past  Pt ran out of nexium last week  Now has ST/ feeling of empty stomach (? Acid)/ and loose stool as well as ST and inc cough  Urged her to get nexium refilled and let us know if this helps

## 2019-11-21 NOTE — Progress Notes (Signed)
Virtual Visit via Video Note  I connected with Alexandria [name] on 11/21/19 at  2:30 PM EDT by a video enabled telemedicine application and verified that I am speaking with the correct person using two identifiers.  Location: Patient: home Provider: office   I discussed the limitations of evaluation and management by telemedicine and the availability of in person appointments. The patient expressed understanding and agreed to proceed.  Parties involved in encounter  [name]  Provider:  Loura Pardon MD    History of Present Illness: 84 yo pt of [name] presents with sore throat and diarrhea   Has also been out of sorts/disoriented for 10 days Started after she sprayed for ants in kitchen  Affected her breathing  Diarrhea -on and off  Really tired  Sore throat also (is able to swallow)  Does not feel swollen  Has a runny nose all the time (worse the past 10 days)   No nausea or vomiting  Her abdomen feels inflamed  Feels like she is hungry (that sensation even though she eats)  No blood in her stool   She does cough - some phlegm /worse than usual  Clear to white phlegm  No fever (temp is good)  97-97.5   No sick contacts that she knows of   No change in smell or taste   Otc:  Used otc med to stop diarrhea- loperimide  Used albuterol several times  Takes nexium every other day -ran out last week  On allegra     She is covid vaccinated Has appt 4 pm tomorrow for a covid test - [facility] (not the rapid test)    quit smoking 17 y ago   Patient Active Problem List   Diagnosis Date Noted  . Sore throat 11/21/2019  . GERD (gastroesophageal reflux disease) 11/21/2019  . CAD (coronary artery disease) 07/24/2019  . Malignant neoplasm of right upper lobe of lung ([place]) 07/24/2019  . URI (upper respiratory infection) 03/22/2019  . Vitamin D deficiency 06/29/2018  . Hyperkalemia 06/28/2018  . Dysuria 01/01/2018  . Abnormal findings on diagnostic  imaging of lung 12/18/2017  . Rhinitis 12/18/2017  . Knee laceration, right, subsequent encounter 10/06/2016  . Knee pain, acute 10/06/2016  . Chronic pain of left knee 09/15/2016  . Right foot pain 09/15/2016  . Chronic respiratory failure with hypoxia ([place]) 07/14/2016  . Diverticulosis of colon without hemorrhage   . Hemorrhoids   . Hiatal hernia   . Colon cancer screening 01/21/2016  . Type 2 diabetes mellitus with diabetic neuropathy, without long-term current use of insulin ([place]) 04/04/2015  . Left temporal headache 03/14/2015  . Chest tightness 12/04/2014  . Chronic diastolic CHF (congestive heart failure) ([place])   . Iron deficiency anemia   . OSA on CPAP   . Obesity hypoventilation syndrome ([place])   . Chronic diastolic congestive heart failure ([place])   . Absolute anemia   . Medicare annual wellness visit, subsequent 06/14/2014  . Advance care planning 06/14/2014  . RLQ abdominal pain 05/10/2014  . COPD exacerbation ([place]) 04/28/2014  . Lower extremity edema 09/15/2012  . SOB (shortness of breath) 08/24/2012  . Shoulder pain 12/17/2011  . Abdominal hernia 12/17/2011  . Anxiety 09/23/2011  . Osteopenia 07/25/2011  . Obstructive sleep apnea 02/09/2010  . MURMUR 02/04/2010  . CHEST PAIN 01/13/2010  . ONYCHOMYCOSIS, TOENAILS 06/30/2009  . PALPITATIONS 08/29/2008  . ESOPHAGEAL STENOSIS 11/22/2007  . DYSPHAGIA 11/08/2007  . ESOPHAGITIS 10/11/2007  . COPD GOLD  II 05/23/2007  . ALLERGY 07/25/2006  . NUMBNESS 10/06/2001  . Hyperlipemia 10/04/2000  . Depression, recurrent ([place]) 03/08/1997  . Essential hypertension 03/08/1992  . Obesity, Class III, BMI 40-49.9 (morbid obesity) ([place]) 03/08/1989  . ROTATOR CUFF SYNDROME, RIGHT 03/08/1982  . Hypothyroidism 03/08/1968   Past Medical History:  Diagnosis Date  . Allergy, unspecified not elsewhere classified   . Anxiety   . Cataract   . Chronic diastolic congestive heart failure ([place])   . COPD (chronic obstructive pulmonary  disease) ([place]) 03/08/1998   PFTs 12/12/2002 FEV 1 1.42 (64%) ratio 58 with no better after B2 and DLCO75%; PFTs 11/20/09 FEV1 1.50 (73%) ratio 50 no better after B2 with DLCO 62%; Hfa 50% 11/20/2009 >75%, 01/13/10 p coaching  . Depression 12/07/1998  . Diabetes mellitus type II 02/05/2006   Dr. Cruzita Lederer with endo  . Disorders of bursae and tendons in shoulder region, unspecified    Rotator cuff syndrome, right  . E. coli bacteremia   . Esophagitis   . GERD (gastroesophageal reflux disease)   . History of UTI   . Hyperlipidemia 10/04/2000  . Hypertension 03/08/1992  . Hypothyroidism 03/08/1968  . Iron deficiency anemia   . Lung nodule    radiation starts 10-08-2019  . Malignant neoplasm of right upper lobe of lung ([place]) 07/24/2019  . Obesity    NOS  . OSA (obstructive sleep apnea)    PSG 01/27/10 AHI 13, pt does not know CPAP settings  . Peripheral neuropathy    Likely due to DM per Dr. Erling Cruz  . Ventral hernia    Past Surgical History:  Procedure Laterality Date  . ABD U/S  03/19/1999   Nml x2 foci in liver  . ADENOSINE MYOVIEW  06/02/2007   Nml  . CARDIOLITE PERSANTINE  08/24/2000   Nml  . CAROTID U/S  08/24/2000   1-39% ICA stenosis  . CAROTID U/S  06/02/2007   No apprec change   . CARPAL TUNNEL RELEASE  12/1997   Right  . CESAREAN SECTION     x2 Breech/ repeat  . CHOLECYSTECTOMY  1997  . COLONOSCOPY WITH PROPOFOL N/A 02/12/2016   Procedure: COLONOSCOPY WITH PROPOFOL;  Surgeon: Milus Banister, MD;  Location: [facility];  Service: Endoscopy;  Laterality: N/A;  . CT ABD W & PELVIS WO/W CM     Abd hemangiomas of liver, 1 cm R renal cyst  . DENTAL SURGERY  2016   Implants  . DEXA  07/03/2003   Nml  . ESOPHAGOGASTRODUODENOSCOPY  12/05/1997   Nml (due to hoarseness)  . ESOPHAGOGASTRODUODENOSCOPY (EGD) WITH PROPOFOL N/A 02/12/2016   Procedure: ESOPHAGOGASTRODUODENOSCOPY (EGD) WITH PROPOFOL;  Surgeon: Milus Banister, MD;  Location: [facility];  Service: Endoscopy;   Laterality: N/A;  . GALLBLADDER SURGERY    . HERNIA REPAIR  01/24/2009   Lap Ventr w/ Lysis of adhesions (Dr. Donne Hazel)  . knee arthroscopic surgery  years ago   right  . ROTATOR CUFF REPAIR  1984   Right, [place]  . SHOULDER OPEN ROTATOR CUFF REPAIR  02/08/2012   Procedure: ROTATOR CUFF REPAIR SHOULDER OPEN;  Surgeon: Magnus Sinning, MD;  Location: [facility];  Service: Orthopedics;  Laterality: Left;  Left Shoulder Open Anterior Acrominectomy Rotator Cuff Repair Open Distal Clavicle Resection ,tissue mend graft, and repair of biceps tendon  . THUMB RELEASE  12/1997   Right  . TONSILLECTOMY    . TOTAL ABDOMINAL HYSTERECTOMY  1985   Due to dysmennorhea  . US ECHOCARDIOGRAPHY  06/02/2007   Social History   Tobacco Use  . Smoking status: Former Smoker    Packs/day: 2.00    Years: 50.00    Pack years: 100.00    Types: Cigarettes    Quit date: 02/05/2005    Years since quitting: 14.8  . Smokeless tobacco: Never Used  Vaping Use  . Vaping Use: Never used  Substance Use Topics  . Alcohol use: Yes    Comment: occasionally  . Drug use: Never   Family History  Problem Relation Age of Onset  . Heart disease Mother   . Thyroid disease Mother   . Emphysema Father        One lung  . Cystic fibrosis Sister   . Hyperthyroidism Sister   . Osteoarthritis Brother   . Hyperthyroidism Brother   . Esophageal cancer Neg Hx   . Cancer Neg Hx        Head or neck  . Colon cancer Neg Hx   . Stomach cancer Neg Hx   . Breast cancer Neg Hx    Allergies  Allergen Reactions  . Antihistamines, Diphenhydramine-Type Other (See Comments)    Able to tolerate only allegra.   . Sulfonamide Derivatives Swelling    REACTION: closed throat  . Incruse Ellipta [Umeclidinium Bromide] Cough    Caused voice change and severe coughing  . Clarithromycin Other (See Comments)    REACTION: diff swallowing and mouth blisters  . Codeine Other (See Comments)    Unknown   . Lyrica [Pregabalin] Other (See  Comments)    Lack of effect for neuropathy pain.    Marland Kitchen Spiriva Handihaler [Tiotropium Bromide Monohydrate] Other (See Comments)    Voice changes   Current Outpatient Medications on File Prior to Visit  Medication Sig Dispense Refill  . albuterol (PROAIR HFA) 108 (90 Base) MCG/ACT inhaler Inhale 2 puffs into the lungs every 6 (six) hours as needed for wheezing or shortness of breath. INHALE 2 PUFFS INTO THE LUNGS EVERY 6 HOURS AS NEEDED FOR WHEEZING OR SHORTNESS OF BREATH 3 Inhaler 3  . budesonide-formoterol (SYMBICORT) 160-4.5 MCG/ACT inhaler Inhale 2 puffs into the lungs 2 (two) times daily. 3 Inhaler 3  . busPIRone (BUSPAR) 5 MG tablet 5 mg 3 (three) times daily. As needed    . Cholecalciferol (VITAMIN D) 50 MCG (2000 UT) tablet Take 1 tablet (2,000 Units total) by mouth daily.    . Coenzyme Q-10 200 MG CAPS Take 200 mg by mouth daily.     . Cyanocobalamin (B-12) 5000 MCG CAPS Take 5,000 mcg by mouth daily.    Marland Kitchen EPINEPHrine (EPIPEN) 0.3 mg/0.3 mL DEVI Inject 0.3 mg into the muscle once as needed (anaphylaxis).     Marland Kitchen esomeprazole (NEXIUM) 40 MG capsule Take 40 mg by mouth every other day.     . ezetimibe (ZETIA) 10 MG tablet TAKE 1 TABLET BY MOUTH EVERYDAY AT BEDTIME 90 tablet 1  . ferrous sulfate 325 (65 FE) MG EC tablet Take 1 tablet (325 mg total) by mouth daily with breakfast.    . fexofenadine (ALLEGRA) 180 MG tablet Take 180 mg by mouth as needed.     Marland Kitchen FLUoxetine (PROZAC) 20 MG capsule Take 20 mg by mouth 2 (two) times daily.    . fluticasone (FLONASE) 50 MCG/ACT nasal spray USE 2 SPRAYS INTO THE NOSE EVERY 12 HOURS AS NEEDED FOR STUFFY NOSE 48 g 2  . glucose blood (ONETOUCH VERIO) test strip Use as instructed to check sugar 2-3x time daily  200 each 5  . GLUTATHIONE PO Take 5 drops by mouth in the morning and at bedtime.    Marland Kitchen LORazepam (ATIVAN) 0.5 MG tablet 1 tab po  30 minutes prior to radiation on 10/16/19 and 10/18/19 for claustrophobia 2 tablet 0  . metFORMIN (GLUCOPHAGE) 500 MG  tablet Take 1 tablet (500 mg total) by mouth daily with supper. 90 tablet 3  . Multiple Vitamins-Minerals (ZINC PO) Take by mouth.    . nitroGLYCERIN (NITROSTAT) 0.4 MG SL tablet Place 1 tablet (0.4 mg total) under the tongue every 5 (five) minutes as needed for chest pain. 25 tablet 3  . ONETOUCH DELICA LANCETS FINE MISC USE TO CHECK SUGAR 1 TIME DAILY 100 each 5  . ONETOUCH VERIO test strip USE AS INSTRUCTED TO CHECK SUGAR 1 TIME DAILY 100 each 8  . rosuvastatin (CRESTOR) 10 MG tablet TAKE 1 TABLET (10 MG TOTAL) BY MOUTH EVERY OTHER DAY. 45 tablet 2  . Spacer/Aero Chamber [employer] Use with  inhaler as needed.  J44.9 1 each 0  . SYNTHROID 150 MCG tablet Take 1 tablet (150 mcg total) by mouth daily before breakfast. 45 tablet 5  . valsartan (DIOVAN) 160 MG tablet TAKE ONE HALF TABLET (80 MG TOTAL) 2 TIMES DAILY. 90 tablet 1  . VITAMIN A PO Take 600 mcg by mouth.    Marland Kitchen VITAMIN D, CHOLECALCIFEROL, PO Take 15 mg by mouth.    . furosemide (LASIX) 20 MG tablet Take 1 tablet (20 mg total) by mouth daily as needed for fluid. 90 tablet 1  . potassium chloride (K-DUR) 10 MEQ tablet Take 1 tablet (10 mEq total) by mouth daily as needed (Takes with furosemide doses.). 90 tablet 1   No current facility-administered medications on file prior to visit.   Review of Systems  Constitutional: Positive for malaise/fatigue. Negative for chills and fever.  HENT: Positive for sore throat. Negative for congestion, ear pain and sinus pain.        Rhinorrhea pnd -mild   Eyes: Negative for blurred vision, discharge and redness.  Respiratory: Positive for cough and sputum production. Negative for shortness of breath and stridor.        Cough is not much more than baseline  Cardiovascular: Negative for chest pain, palpitations and leg swelling.  Gastrointestinal: Positive for diarrhea. Negative for abdominal pain, nausea and vomiting.  Musculoskeletal: Negative for myalgias.  Skin: Negative for rash.   Neurological: Negative for dizziness and headaches.     Observations/Objective: Patient appears well, in no distress Wearing 02 nasal cannula  Weight is baseline  No facial swelling or asymmetry Voice is slightly hoarse No obvious tremor or mobility impairment Moving neck and UEs normally Able to hear the call well  No cough or shortness of breath during interview  Talkative and mentally sharp with no cognitive changes No skin changes on face or neck , no rash or pallor Affect is normal    Assessment and Plan: Problem List Items Addressed This Visit      Digestive   GERD (gastroesophageal reflux disease)    With a h/o esophagitis in the past  Pt ran out of nexium last week  Now has ST/ feeling of empty stomach (? Acid)/ and loose stool as well as ST and inc cough  Urged her to get nexium refilled and let us know if this helps          Other   Sore throat - Primary    About 10 days  with some runny nose and cough and diarrhea  Started after kitchen was sprayed for ants- unsure if related Also some diarrhea Getting a covid test tomorrow at [facility] and will call with results  Also ran out of nexium-adv to get that refilled (disc that GERD can cause ST and cough)  If not improved or worse -recommend UC eval with strep test  She voiced understanding and will keep [place] updated          Follow Up Instructions: Get your covid test as planned and call us with results   Get back on your nexium Try salt water gargle for sore throat  Also look in mirror with a light to see if throat is read or swollen looking  Watch for fever or other new symptoms (shortness of breath or change in taste or smell)  Tylenol for pain as needed  Drink fluids Avoid the ant spray as much as you can   If no improvement or worse-you may need a strep test in an urgent care clinic Please keep [place] updated   I discussed the assessment and treatment plan with the patient. The patient was provided an  opportunity to ask questions and all were answered. The patient agreed with the plan and demonstrated an understanding of the instructions.   The patient was advised to call back or seek an in-person evaluation if the symptoms worsen or if the condition fails to improve as anticipated.    Loura Pardon, MD

## 2019-11-21 NOTE — Assessment & Plan Note (Signed)
About 10 days with some runny nose and cough and diarrhea  Started after kitchen was sprayed for ants- unsure if related Also some diarrhea Getting a covid test tomorrow at [facility] and will call with results  Also ran out of nexium-adv to get that refilled (disc that GERD can cause ST and cough)  If not improved or worse -recommend UC eval with strep test  She voiced understanding and will keep [place] updated

## 2019-11-21 NOTE — Telephone Encounter (Signed)
Aware- will cc pcp

## 2019-11-21 NOTE — Telephone Encounter (Signed)
Noted.  Will await urgent care notes.  Thanks.

## 2019-11-22 ENCOUNTER — Ambulatory Visit (HOSPITAL_COMMUNITY)
Admission: EM | Admit: 2019-11-22 | Discharge: 2019-11-22 | Disposition: A | Discharge status: Home / Self Care | Payer: Medicare Other | Attending: Internal Medicine | Admitting: Internal Medicine

## 2019-11-22 ENCOUNTER — Ambulatory Visit (INDEPENDENT_AMBULATORY_CARE_PROVIDER_SITE_OTHER): Payer: Medicare Other

## 2019-11-22 ENCOUNTER — Encounter (HOSPITAL_COMMUNITY): Payer: Self-pay | Admitting: Emergency Medicine

## 2019-11-22 ENCOUNTER — Other Ambulatory Visit: Payer: Self-pay

## 2019-11-22 DIAGNOSIS — I5032 Chronic diastolic (congestive) heart failure: Secondary | ICD-10-CM | POA: Diagnosis not present

## 2019-11-22 DIAGNOSIS — Z87891 Personal history of nicotine dependence: Secondary | ICD-10-CM | POA: Diagnosis not present

## 2019-11-22 DIAGNOSIS — Z79899 Other long term (current) drug therapy: Secondary | ICD-10-CM | POA: Insufficient documentation

## 2019-11-22 DIAGNOSIS — F419 Anxiety disorder, unspecified: Secondary | ICD-10-CM | POA: Diagnosis not present

## 2019-11-22 DIAGNOSIS — Z20822 Contact with and (suspected) exposure to covid-19: Secondary | ICD-10-CM | POA: Insufficient documentation

## 2019-11-22 DIAGNOSIS — Z1152 Encounter for screening for COVID-19: Secondary | ICD-10-CM

## 2019-11-22 DIAGNOSIS — R05 Cough: Secondary | ICD-10-CM | POA: Diagnosis not present

## 2019-11-22 DIAGNOSIS — D509 Iron deficiency anemia, unspecified: Secondary | ICD-10-CM | POA: Insufficient documentation

## 2019-11-22 DIAGNOSIS — J069 Acute upper respiratory infection, unspecified: Secondary | ICD-10-CM | POA: Diagnosis not present

## 2019-11-22 DIAGNOSIS — J029 Acute pharyngitis, unspecified: Secondary | ICD-10-CM | POA: Insufficient documentation

## 2019-11-22 DIAGNOSIS — I110 Hypertensive heart disease with heart failure: Secondary | ICD-10-CM | POA: Insufficient documentation

## 2019-11-22 DIAGNOSIS — E559 Vitamin D deficiency, unspecified: Secondary | ICD-10-CM | POA: Diagnosis not present

## 2019-11-22 DIAGNOSIS — R011 Cardiac murmur, unspecified: Secondary | ICD-10-CM | POA: Diagnosis not present

## 2019-11-22 DIAGNOSIS — Z9981 Dependence on supplemental oxygen: Secondary | ICD-10-CM | POA: Insufficient documentation

## 2019-11-22 DIAGNOSIS — E1136 Type 2 diabetes mellitus with diabetic cataract: Secondary | ICD-10-CM | POA: Diagnosis not present

## 2019-11-22 DIAGNOSIS — R0602 Shortness of breath: Secondary | ICD-10-CM | POA: Diagnosis not present

## 2019-11-22 DIAGNOSIS — E662 Morbid (severe) obesity with alveolar hypoventilation: Secondary | ICD-10-CM | POA: Insufficient documentation

## 2019-11-22 DIAGNOSIS — Z7984 Long term (current) use of oral hypoglycemic drugs: Secondary | ICD-10-CM | POA: Insufficient documentation

## 2019-11-22 DIAGNOSIS — J449 Chronic obstructive pulmonary disease, unspecified: Secondary | ICD-10-CM | POA: Diagnosis not present

## 2019-11-22 DIAGNOSIS — F329 Major depressive disorder, single episode, unspecified: Secondary | ICD-10-CM | POA: Insufficient documentation

## 2019-11-22 DIAGNOSIS — I2510 Atherosclerotic heart disease of native coronary artery without angina pectoris: Secondary | ICD-10-CM | POA: Insufficient documentation

## 2019-11-22 DIAGNOSIS — E039 Hypothyroidism, unspecified: Secondary | ICD-10-CM | POA: Insufficient documentation

## 2019-11-22 DIAGNOSIS — E1142 Type 2 diabetes mellitus with diabetic polyneuropathy: Secondary | ICD-10-CM | POA: Insufficient documentation

## 2019-11-22 DIAGNOSIS — J441 Chronic obstructive pulmonary disease with (acute) exacerbation: Secondary | ICD-10-CM | POA: Diagnosis not present

## 2019-11-22 DIAGNOSIS — R197 Diarrhea, unspecified: Secondary | ICD-10-CM | POA: Insufficient documentation

## 2019-11-22 DIAGNOSIS — K2100 Gastro-esophageal reflux disease with esophagitis, without bleeding: Secondary | ICD-10-CM | POA: Diagnosis not present

## 2019-11-22 DIAGNOSIS — M8580 Other specified disorders of bone density and structure, unspecified site: Secondary | ICD-10-CM | POA: Diagnosis not present

## 2019-11-22 DIAGNOSIS — E785 Hyperlipidemia, unspecified: Secondary | ICD-10-CM | POA: Diagnosis not present

## 2019-11-22 LAB — SARS CORONAVIRUS 2 (TAT 6-24 HRS): SARS Coronavirus 2: NEGATIVE

## 2019-11-22 NOTE — ED Provider Notes (Signed)
Kennan    CSN: [id number] Arrival date & time: 11/22/19  1349      History   Chief Complaint Chief Complaint  Patient presents with   Shortness of Breath   Fatigue   Cough   Sore Throat    HPI Alexandria [name] is a 84 y.o. female.   Presenting today for 10 days of rhinorrhea, diarrhea, fatigue, mild cough, sore throat. Denies fever, chills, SOB, CP, wheezing. States she's been trying antihistamines, salt water gargles, and home inhaler and O2 regimen. Here for COVID test and CXR as her family is worried about her lungs. Scheduled for a chest CT later this month to further evaluate a possible nodule found recently. Known hx of COPD on continuous O2. States her O2 needs have not increased since becoming ill.      Past Medical History:  Diagnosis Date   Allergy, unspecified not elsewhere classified    Anxiety    Cataract    Chronic diastolic congestive heart failure (HCC)    COPD (chronic obstructive pulmonary disease) ([place]) 03/08/1998   PFTs 12/12/2002 FEV 1 1.42 (64%) ratio 58 with no better after B2 and DLCO75%; PFTs 11/20/09 FEV1 1.50 (73%) ratio 50 no better after B2 with DLCO 62%; Hfa 50% 11/20/2009 >75%, 01/13/10 p coaching   Depression 12/07/1998   Diabetes mellitus type II 02/05/2006   Dr. Cruzita Lederer with endo   Disorders of bursae and tendons in shoulder region, unspecified    Rotator cuff syndrome, right   E. coli bacteremia    Esophagitis    GERD (gastroesophageal reflux disease)    History of UTI    Hyperlipidemia 10/04/2000   Hypertension 03/08/1992   Hypothyroidism 03/08/1968   Iron deficiency anemia    Lung nodule    radiation starts 10-08-2019   Malignant neoplasm of right upper lobe of lung ([place]) 07/24/2019   Obesity    NOS   OSA (obstructive sleep apnea)    PSG 01/27/10 AHI 13, pt does not know CPAP settings   Peripheral neuropathy    Likely due to DM per Dr. Murvin Natal hernia     Patient Active Problem  List   Diagnosis Date Noted   Sore throat 11/21/2019   GERD (gastroesophageal reflux disease) 11/21/2019   CAD (coronary artery disease) 07/24/2019   Malignant neoplasm of right upper lobe of lung ([place]) 07/24/2019   URI (upper respiratory infection) 03/22/2019   Vitamin D deficiency 06/29/2018   Hyperkalemia 06/28/2018   Dysuria 01/01/2018   Abnormal findings on diagnostic imaging of lung 12/18/2017   Rhinitis 12/18/2017   Knee laceration, right, subsequent encounter 10/06/2016   Knee pain, acute 10/06/2016   Chronic pain of left knee 09/15/2016   Right foot pain 09/15/2016   Chronic respiratory failure with hypoxia ([place]) 07/14/2016   Diverticulosis of colon without hemorrhage    Hemorrhoids    Hiatal hernia    Colon cancer screening 01/21/2016   Type 2 diabetes mellitus with diabetic neuropathy, without long-term current use of insulin ([place]) 04/04/2015   Left temporal headache 03/14/2015   Chest tightness 12/04/2014   Chronic diastolic CHF (congestive heart failure) (HCC)    Iron deficiency anemia    OSA on CPAP    Obesity hypoventilation syndrome (HCC)    Chronic diastolic congestive heart failure ([place])    Absolute anemia    Medicare annual wellness visit, subsequent 06/14/2014   Advance care planning 06/14/2014   RLQ abdominal pain 05/10/2014   COPD exacerbation (  [place]) 04/28/2014   Lower extremity edema 09/15/2012   SOB (shortness of breath) 08/24/2012   Shoulder pain 12/17/2011   Abdominal hernia 12/17/2011   Anxiety 09/23/2011   Osteopenia 07/25/2011   Obstructive sleep apnea 02/09/2010   MURMUR 02/04/2010   CHEST PAIN 01/13/2010   ONYCHOMYCOSIS, TOENAILS 06/30/2009   PALPITATIONS 08/29/2008   ESOPHAGEAL STENOSIS 11/22/2007   DYSPHAGIA 11/08/2007   ESOPHAGITIS 10/11/2007   COPD GOLD II 05/23/2007   ALLERGY 07/25/2006   NUMBNESS 10/06/2001   Hyperlipemia 10/04/2000   Depression, recurrent ([place]) 03/08/1997    Essential hypertension 03/08/1992   Obesity, Class III, BMI 40-49.9 (morbid obesity) ([place]) 03/08/1989   ROTATOR CUFF SYNDROME, RIGHT 03/08/1982   Hypothyroidism 03/08/1968    Past Surgical History:  Procedure Laterality Date   ABD U/S  03/19/1999   Nml x2 foci in liver   ADENOSINE MYOVIEW  06/02/2007   Nml   CARDIOLITE PERSANTINE  08/24/2000   Nml   CAROTID U/S  08/24/2000   1-39% ICA stenosis   CAROTID U/S  06/02/2007   No apprec change    CARPAL TUNNEL RELEASE  12/1997   Right   CESAREAN SECTION     x2 [place]/ repeat   CHOLECYSTECTOMY  1997   COLONOSCOPY WITH PROPOFOL N/A 02/12/2016   Procedure: COLONOSCOPY WITH PROPOFOL;  Surgeon: Milus Banister, MD;  Location: [facility];  Service: Endoscopy;  Laterality: N/A;   CT ABD W & PELVIS WO/W CM     Abd hemangiomas of liver, 1 cm R renal cyst   DENTAL SURGERY  2016   Implants   DEXA  07/03/2003   Nml   ESOPHAGOGASTRODUODENOSCOPY  12/05/1997   Nml (due to hoarseness)   ESOPHAGOGASTRODUODENOSCOPY (EGD) WITH PROPOFOL N/A 02/12/2016   Procedure: ESOPHAGOGASTRODUODENOSCOPY (EGD) WITH PROPOFOL;  Surgeon: Milus Banister, MD;  Location: [facility];  Service: Endoscopy;  Laterality: N/A;   GALLBLADDER SURGERY     HERNIA REPAIR  01/24/2009   Lap Ventr w/ Lysis of adhesions (Dr. Donne Hazel)   knee arthroscopic surgery  years ago   right   ROTATOR CUFF REPAIR  1984   Right, [place]   SHOULDER OPEN ROTATOR CUFF REPAIR  02/08/2012   Procedure: ROTATOR CUFF REPAIR SHOULDER OPEN;  Surgeon: Magnus Sinning, MD;  Location: [facility];  Service: Orthopedics;  Laterality: Left;  Left Shoulder Open Anterior Acrominectomy Rotator Cuff Repair Open Distal Clavicle Resection ,tissue mend graft, and repair of biceps tendon   THUMB RELEASE  12/1997   Right   TONSILLECTOMY     TOTAL ABDOMINAL HYSTERECTOMY  1985   Due to dysmennorhea   US ECHOCARDIOGRAPHY  06/02/2007    OB History   No obstetric history on file.       Home Medications    Prior to Admission medications   Medication Sig Start Date End Date Taking? Authorizing Provider  albuterol (PROAIR HFA) 108 (90 Base) MCG/ACT inhaler Inhale 2 puffs into the lungs every 6 (six) hours as needed for wheezing or shortness of breath. INHALE 2 PUFFS INTO THE LUNGS EVERY 6 HOURS AS NEEDED FOR WHEEZING OR SHORTNESS OF BREATH 04/19/18  Yes Chesley Mires, MD  budesonide-formoterol (SYMBICORT) 160-4.5 MCG/ACT inhaler Inhale 2 puffs into the lungs 2 (two) times daily. 04/19/18  Yes Chesley Mires, MD  busPIRone (BUSPAR) 5 MG tablet 5 mg 3 (three) times daily. As needed 05/18/18  Yes [provider]  Cholecalciferol (VITAMIN D) 50 MCG (2000 UT) tablet Take 1 tablet (2,000 Units total) by mouth daily.  06/27/18  Yes Tonia Ghent, MD  Coenzyme Q-10 200 MG CAPS Take 200 mg by mouth daily.    Yes [provider]  Cyanocobalamin (B-12) 5000 MCG CAPS Take 5,000 mcg by mouth daily.   Yes [provider]  EPINEPHrine (EPIPEN) 0.3 mg/0.3 mL DEVI Inject 0.3 mg into the muscle once as needed (anaphylaxis).    Yes [provider]  esomeprazole (NEXIUM) 40 MG capsule Take 40 mg by mouth every other day.    Yes [provider]  ezetimibe (ZETIA) 10 MG tablet TAKE 1 TABLET BY MOUTH EVERYDAY AT BEDTIME 03/20/19  Yes Tonia Ghent, MD  ferrous sulfate 325 (65 FE) MG EC tablet Take 1 tablet (325 mg total) by mouth daily with breakfast. 01/08/16  Yes Tonia Ghent, MD  fexofenadine (ALLEGRA) 180 MG tablet Take 180 mg by mouth as needed.    Yes [provider]  FLUoxetine (PROZAC) 20 MG capsule Take 20 mg by mouth 2 (two) times daily.   Yes [provider]  fluticasone (FLONASE) 50 MCG/ACT nasal spray USE 2 SPRAYS INTO THE NOSE EVERY 12 HOURS AS NEEDED FOR STUFFY NOSE 05/01/18  Yes Tonia Ghent, MD  glucose blood (ONETOUCH VERIO) test strip Use as instructed to check sugar 2-3x time daily 02/21/17  Yes Philemon Kingdom,  MD  GLUTATHIONE PO Take 5 drops by mouth in the morning and at bedtime.   Yes [provider]  LORazepam (ATIVAN) 0.5 MG tablet 1 tab po  30 minutes prior to radiation on 10/16/19 and 10/18/19 for claustrophobia 10/15/19  Yes Hayden Pedro, PA-C  metFORMIN (GLUCOPHAGE) 500 MG tablet Take 1 tablet (500 mg total) by mouth daily with supper. 05/02/19  Yes Philemon Kingdom, MD  Multiple Vitamins-Minerals (ZINC PO) Take by mouth.   Yes [provider]  nitroGLYCERIN (NITROSTAT) 0.4 MG SL tablet Place 1 tablet (0.4 mg total) under the tongue every 5 (five) minutes as needed for chest pain. 06/27/18  Yes Tonia Ghent, MD  [facility] DELICA LANCETS FINE MISC USE TO CHECK SUGAR 1 TIME DAILY 09/28/17  Yes Philemon Kingdom, MD  [facility] VERIO test strip USE AS INSTRUCTED TO CHECK SUGAR 1 TIME DAILY 02/07/18  Yes Philemon Kingdom, MD  rosuvastatin (CRESTOR) 10 MG tablet TAKE 1 TABLET (10 MG TOTAL) BY MOUTH EVERY OTHER DAY. 08/23/19  Yes Tonia Ghent, MD  Spacer/Aero Chamber Mouthpiece MISC Use with  inhaler as needed.  J44.9 09/30/17  Yes Tonia Ghent, MD  SYNTHROID 150 MCG tablet Take 1 tablet (150 mcg total) by mouth daily before breakfast. 11/01/19  Yes Philemon Kingdom, MD  valsartan (DIOVAN) 160 MG tablet TAKE ONE HALF TABLET (80 MG TOTAL) 2 TIMES DAILY. 06/12/19  Yes Tonia Ghent, MD  VITAMIN A PO Take 600 mcg by mouth.   Yes [provider]  VITAMIN D, CHOLECALCIFEROL, PO Take 15 mg by mouth.   Yes [provider]  furosemide (LASIX) 20 MG tablet Take 1 tablet (20 mg total) by mouth daily as needed for fluid. 08/14/18 08/14/19  Tonia Ghent, MD  potassium chloride (K-DUR) 10 MEQ tablet Take 1 tablet (10 mEq total) by mouth daily as needed (Takes with furosemide doses.). 08/14/18 08/14/19  Tonia Ghent, MD    Family History Family History  Problem Relation Age of Onset   Heart disease Mother    Thyroid disease Mother    Emphysema Father         One lung  Cystic fibrosis Sister    Hyperthyroidism Sister    Osteoarthritis Brother    Hyperthyroidism Brother    Esophageal cancer Neg Hx    Cancer Neg Hx        Head or neck   Colon cancer Neg Hx    Stomach cancer Neg Hx    Breast cancer Neg Hx     Social History Social History   Tobacco Use   Smoking status: Former Smoker    Packs/day: 2.00    Years: 50.00    Pack years: 100.00    Types: Cigarettes    Quit date: 02/05/2005    Years since quitting: 14.8   Smokeless tobacco: Never Used  Vaping Use   Vaping Use: Never used  Substance Use Topics   Alcohol use: Yes    Comment: occasionally   Drug use: Never     Allergies   Antihistamines, diphenhydramine-type; Sulfonamide derivatives; Incruse ellipta [umeclidinium bromide]; Clarithromycin; Codeine; Lyrica [pregabalin]; and Spiriva handihaler [tiotropium bromide monohydrate]   Review of Systems Review of Systems PER HPI   Physical Exam Triage Vital Signs ED Triage Vitals  Enc Vitals Group     BP 11/22/19 1446 (!) 147/52     Pulse Rate 11/22/19 1446 71     Resp 11/22/19 1446 19     Temp 11/22/19 1446 98.9 F (37.2 C)     Temp Source 11/22/19 1446 Oral     SpO2 11/22/19 1446 96 %     Weight --      Height --      Head Circumference --      Peak Flow --      Pain Score 11/22/19 1447 0     Pain Loc --      Pain Edu? --      Excl. in [place]? --    No data found.  Updated Vital Signs BP (!) 147/52 (BP Location: Left Arm)    Pulse 71    Temp 98.9 F (37.2 C) (Oral)    Resp 19    LMP  (LMP Unknown)    SpO2 96%   Visual Acuity Right Eye Distance:   Left Eye Distance:   Bilateral Distance:    Right Eye Near:   Left Eye Near:    Bilateral Near:     Physical Exam Vitals and nursing note reviewed.  Constitutional:      Appearance: Normal appearance. She is not ill-appearing.  HENT:     Head: Atraumatic.     Right Ear: Tympanic membrane normal.     Left Ear: Tympanic membrane normal.      Nose: Nose normal. No congestion.     Mouth/Throat:     Mouth: Mucous membranes are moist.     Pharynx: Posterior oropharyngeal erythema present.  Eyes:     Extraocular Movements: Extraocular movements intact.     Conjunctiva/sclera: Conjunctivae normal.  Cardiovascular:     Rate and Rhythm: Normal rate and regular rhythm.     Heart sounds: Normal heart sounds.  Pulmonary:     Effort: Pulmonary effort is normal. No respiratory distress.     Breath sounds: No wheezing or rales.     Comments: Breath sounds decreased throughout On O2 via nasal cannula Abdominal:     General: Bowel sounds are normal. There is no distension.     Palpations: Abdomen is soft.     Tenderness: There is no abdominal tenderness.  Musculoskeletal:        General: Normal  range of motion.     Cervical back: Normal range of motion and neck supple.  Skin:    General: Skin is warm and dry.  Neurological:     Mental Status: She is alert and oriented to person, place, and time.  Psychiatric:        Mood and Affect: Mood normal.        Thought Content: Thought content normal.        Judgment: Judgment normal.      UC Treatments / Results  Labs (all labs ordered are listed, but only abnormal results are displayed) Labs Reviewed  SARS CORONAVIRUS 2 (TAT 6-24 HRS)    EKG   Radiology DG Chest 2 View  Result Date: 11/22/2019 CLINICAL DATA:  84 year old female with persistent cough. COVID-19. Status pending. EXAM: CHEST - 2 VIEW COMPARISON:  Chest CT 06/13/2019 and earlier. FINDINGS: Lung volumes and mediastinal contours are stable since 2017, chronic pulmonary hyperinflation. Chronic increased interstitial markings are stable. No pneumothorax or pleural effusion. No acute pulmonary opacity. Visualized tracheal air column is within normal limits. Calcified aortic atherosclerosis. Osteopenia. No acute osseous abnormality identified. Stable cholecystectomy clips. Negative visible bowel gas pattern. IMPRESSION: 1.  Stable.  No acute cardiopulmonary abnormality. 2. Chronic hyperinflation, Aortic Atherosclerosis (ICD10-I70.0). Electronically Signed   By: Genevie Ann M.D.   On: 11/22/2019 16:10    Procedures Procedures (including critical care time)  Medications Ordered in UC Medications - No data to display  Initial Impression / Assessment and Plan / UC Course  I have reviewed the triage vital signs and the nursing notes.  Pertinent labs & imaging results that were available during my care of the patient were reviewed by me and considered in my medical decision making (see chart for details).     Will test for COVID, isolate until results return. Vitals, CXR, and exam very reassuring today with no concerning findings. Continue OTC symptomatic mgmt and supportive care. Strict return precautions reviewed for worsening sxs.   Final Clinical Impressions(s) / UC Diagnoses   Final diagnoses:  Viral URI with cough  Chronic obstructive pulmonary disease, unspecified COPD type [facility])   Discharge Instructions   None    ED Prescriptions    None     PDMP not reviewed this encounter.   Volney American, [place] 11/22/19 1636

## 2019-11-22 NOTE — ED Triage Notes (Signed)
Pt reports that they were spraying her house for ants and the next day she became fatigued, had a cough and diarrhea. Pt states since then she has had a video visit with her primary but would like to be tested for covid. She states she had a nodule on her lungs and wants to be sure her lungs are ok.

## 2019-11-23 ENCOUNTER — Other Ambulatory Visit: Payer: Self-pay | Admitting: Internal Medicine

## 2019-11-26 NOTE — Progress Notes (Signed)
Cardiology Office Note  Date:  11/27/2019   ID:  Alexandria [name], DOB 02-05-1936, MRN [id number]  PCP:  Tonia Ghent, MD   Chief Complaint  Patient presents with  . other    6 month follow up. Meds reviewed by the pt. verbally. Pt. c/o shortness of breath, feels washed out, sore throat and feels a fullness in chest and abdominal bloatness.  Pt. had a negative Covid test last week.     HPI:  84 year old woman with  obesity,   long history of smoking for 50 years, COPD, quit 15 years DM2, dx in 2006, non-insulin-dependent, controlled, with  mild CKD hyperlipidemia,  hypertension,   2012  chest pain, stress test at that time with no ischemia, normal EF  July 2016 with COPD exacerbation, sepsis. History of sleep apnea, not on CPAP, seen by Dr. Llana Aliment, mask does not fit Chronic SOB, normal echo 09/2014 Diagnosis of lung cancer,  completed radiation , planned chemo She presents for follow-up of her shortness of breath symptoms  In follow-up today feels she is getting over a cold Sore throat, cough, post nasal drip covid neg  Recent studies reviewed with her CT scan chest 06/2019: Subsolid 2.2 cm posterior apical right upper lobe pulmonary nodule with 1.0 cm solid component, mildly increased since 12/14/2018 CT, slowly increasing on multiple CT studies back to 2017. Findings are most compatible with a slow growing primary bronchogenic carcinoma.  PET scan Increased metabolic activity in the area of the part solid nodule in the RIGHT upper lobe. Findings are suspicious for indolent bronchogenic neoplasm.  Did XRT Declined chemo Repeat imaging scheduled  Not taking lasix much, denies any leg swelling but she does report having abdominal fullness  Labs reviewed Total chol 148 LDL 69  EKG personally reviewed by myself on todays visit Shows normal sinus rhythm rate 71 bpm no significant ST or T wave changes  Other past medical history reviewed Previous Escherichia coli  sepsis,  At that time had anemia with hematocrit down to 25, iron deficiency anemia Treated for COPD exacerbation CT scan of the abdomen showed mild descending aorta atherosclerosis   taking Crestor and zetia.   PMH:   has a past medical history of Allergy, unspecified not elsewhere classified, Anxiety, Cataract, Chronic diastolic congestive heart failure ([employer]), COPD (chronic obstructive pulmonary disease) ([place]) (03/08/1998), Depression (12/07/1998), Diabetes mellitus type II (02/05/2006), Disorders of bursae and tendons in shoulder region, unspecified, E. coli bacteremia, Esophagitis, GERD (gastroesophageal reflux disease), History of UTI, Hyperlipidemia (10/04/2000), Hypertension (03/08/1992), Hypothyroidism (03/08/1968), Iron deficiency anemia, Lung nodule, Malignant neoplasm of right upper lobe of lung ([place]) (07/24/2019), Obesity, OSA (obstructive sleep apnea), Peripheral neuropathy, and Ventral hernia.  PSH:    Past Surgical History:  Procedure Laterality Date  . ABD U/S  03/19/1999   Nml x2 foci in liver  . ADENOSINE MYOVIEW  06/02/2007   Nml  . CARDIOLITE PERSANTINE  08/24/2000   Nml  . CAROTID U/S  08/24/2000   1-39% ICA stenosis  . CAROTID U/S  06/02/2007   No apprec change   . CARPAL TUNNEL RELEASE  12/1997   Right  . CESAREAN SECTION     x2 Breech/ repeat  . CHOLECYSTECTOMY  1997  . COLONOSCOPY WITH PROPOFOL N/A 02/12/2016   Procedure: COLONOSCOPY WITH PROPOFOL;  Surgeon: Milus Banister, MD;  Location: [facility];  Service: Endoscopy;  Laterality: N/A;  . CT ABD W & PELVIS WO/W CM     Abd hemangiomas of liver, 1  cm R renal cyst  . DENTAL SURGERY  2016   Implants  . DEXA  07/03/2003   Nml  . ESOPHAGOGASTRODUODENOSCOPY  12/05/1997   Nml (due to hoarseness)  . ESOPHAGOGASTRODUODENOSCOPY (EGD) WITH PROPOFOL N/A 02/12/2016   Procedure: ESOPHAGOGASTRODUODENOSCOPY (EGD) WITH PROPOFOL;  Surgeon: Milus Banister, MD;  Location: [facility];  Service: Endoscopy;  Laterality:  N/A;  . GALLBLADDER SURGERY    . HERNIA REPAIR  01/24/2009   Lap Ventr w/ Lysis of adhesions (Dr. Donne Hazel)  . knee arthroscopic surgery  years ago   right  . ROTATOR CUFF REPAIR  1984   Right, [place]  . SHOULDER OPEN ROTATOR CUFF REPAIR  02/08/2012   Procedure: ROTATOR CUFF REPAIR SHOULDER OPEN;  Surgeon: Magnus Sinning, MD;  Location: [facility];  Service: Orthopedics;  Laterality: Left;  Left Shoulder Open Anterior Acrominectomy Rotator Cuff Repair Open Distal Clavicle Resection ,tissue mend graft, and repair of biceps tendon  . THUMB RELEASE  12/1997   Right  . TONSILLECTOMY    . TOTAL ABDOMINAL HYSTERECTOMY  1985   Due to dysmennorhea  . US ECHOCARDIOGRAPHY  06/02/2007    Current Outpatient Medications  Medication Sig Dispense Refill  . albuterol (PROAIR HFA) 108 (90 Base) MCG/ACT inhaler Inhale 2 puffs into the lungs every 6 (six) hours as needed for wheezing or shortness of breath. INHALE 2 PUFFS INTO THE LUNGS EVERY 6 HOURS AS NEEDED FOR WHEEZING OR SHORTNESS OF BREATH 3 Inhaler 3  . budesonide-formoterol (SYMBICORT) 160-4.5 MCG/ACT inhaler Inhale 2 puffs into the lungs 2 (two) times daily. 3 Inhaler 3  . busPIRone (BUSPAR) 5 MG tablet 5 mg 3 (three) times daily. As needed    . Cholecalciferol (VITAMIN D) 50 MCG (2000 UT) tablet Take 1 tablet (2,000 Units total) by mouth daily.    . Coenzyme Q-10 200 MG CAPS Take 200 mg by mouth daily.     . Cyanocobalamin (B-12) 5000 MCG CAPS Take 5,000 mcg by mouth daily.    Marland Kitchen EPINEPHrine (EPIPEN) 0.3 mg/0.3 mL DEVI Inject 0.3 mg into the muscle once as needed (anaphylaxis).     Marland Kitchen esomeprazole (NEXIUM) 40 MG capsule Take 40 mg by mouth every other day.     . ezetimibe (ZETIA) 10 MG tablet TAKE 1 TABLET BY MOUTH EVERYDAY AT BEDTIME 90 tablet 1  . ferrous sulfate 325 (65 FE) MG EC tablet Take 1 tablet (325 mg total) by mouth daily with breakfast.    . fexofenadine (ALLEGRA) 180 MG tablet Take 180 mg by mouth as needed.     Marland Kitchen FLUoxetine  (PROZAC) 20 MG capsule Take 20 mg by mouth 2 (two) times daily.    . fluticasone (FLONASE) 50 MCG/ACT nasal spray USE 2 SPRAYS INTO THE NOSE EVERY 12 HOURS AS NEEDED FOR STUFFY NOSE 48 g 2  . furosemide (LASIX) 20 MG tablet Take 1 tablet (20 mg total) by mouth daily as needed for fluid. 90 tablet 1  . glucose blood (ONETOUCH VERIO) test strip Use as instructed to check sugar 2-3x time daily 200 each 5  . GLUTATHIONE PO Take 5 drops by mouth in the morning and at bedtime.    Marland Kitchen LORazepam (ATIVAN) 0.5 MG tablet 1 tab po  30 minutes prior to radiation on 10/16/19 and 10/18/19 for claustrophobia 2 tablet 0  . metFORMIN (GLUCOPHAGE) 500 MG tablet Take 1 tablet (500 mg total) by mouth daily with supper. 90 tablet 3  . Multiple Vitamins-Minerals (ZINC PO) Take by mouth.    Marland Kitchen  nitroGLYCERIN (NITROSTAT) 0.4 MG SL tablet Place 1 tablet (0.4 mg total) under the tongue every 5 (five) minutes as needed for chest pain. 25 tablet 3  . ONETOUCH DELICA LANCETS FINE MISC USE TO CHECK SUGAR 1 TIME DAILY 100 each 5  . ONETOUCH VERIO test strip USE AS INSTRUCTED TO CHECK SUGAR 1 TIME DAILY 100 strip 8  . potassium chloride (K-DUR) 10 MEQ tablet Take 1 tablet (10 mEq total) by mouth daily as needed (Takes with furosemide doses.). 90 tablet 1  . rosuvastatin (CRESTOR) 10 MG tablet TAKE 1 TABLET (10 MG TOTAL) BY MOUTH EVERY OTHER DAY. 45 tablet 2  . Spacer/Aero Chamber [employer] Use with  inhaler as needed.  J44.9 1 each 0  . SYNTHROID 150 MCG tablet Take 1 tablet (150 mcg total) by mouth daily before breakfast. 45 tablet 5  . valsartan (DIOVAN) 160 MG tablet TAKE ONE HALF TABLET (80 MG TOTAL) 2 TIMES DAILY. 90 tablet 1  . VITAMIN A PO Take 600 mcg by mouth.    Marland Kitchen VITAMIN D, CHOLECALCIFEROL, PO Take 15 mg by mouth.     No current facility-administered medications for this visit.     Allergies:   Antihistamines, diphenhydramine-type; Sulfonamide derivatives; Incruse ellipta [umeclidinium bromide]; Clarithromycin;  Codeine; Lyrica [pregabalin]; and Spiriva handihaler [tiotropium bromide monohydrate]   Social History:  The patient  reports that she quit smoking about 14 years ago. Her smoking use included cigarettes. She has a 100.00 pack-year smoking history. She has never used smokeless tobacco. She reports current alcohol use. She reports that she does not use drugs.   Family History:   family history includes Cystic fibrosis in her sister; Emphysema in her father; Heart disease in her mother; Hyperthyroidism in her brother and sister; Osteoarthritis in her brother; Thyroid disease in her mother.    Review of Systems: Review of Systems  Constitutional: Negative.   HENT: Negative.   Respiratory: Positive for shortness of breath.   Cardiovascular: Negative.   Gastrointestinal: Negative.   Musculoskeletal: Negative.        Leg weakness  Neurological: Negative.   Psychiatric/Behavioral: Negative.   All other systems reviewed and are negative.    PHYSICAL EXAM: VS:  BP 130/60 (BP Location: Right Arm, Patient Position: Sitting, Cuff Size: Large)   Pulse 71   Ht 5\' 5"  (1.651 m)   Wt 226 lb 8 oz (102.7 kg)   LMP  (LMP Unknown)   SpO2 97% Comment: on 2 liters oxygen  BMI 37.69 kg/m  , BMI Body mass index is 37.69 kg/m. Constitutional:  oriented to person, place, and time. No distress.  Presents in a wheelchair on oxygen HENT:  Head: Grossly normal Eyes:  no discharge. No scleral icterus.  Neck: No JVD, no carotid bruits  Cardiovascular: Regular rate and rhythm, no murmurs appreciated Pulmonary/Chest: Clear to auscultation bilaterally, no wheezes or rails Abdominal: Soft.  no distension.  no tenderness.  Musculoskeletal: Normal range of motion Neurological:  normal muscle tone. Coordination normal. No atrophy Skin: Skin warm and dry Psychiatric: normal affect, pleasant   Recent Labs: 06/04/2019: ALT 11; BUN 24; Creatinine, Ser 0.88; Potassium 4.4; Sodium 142 08/30/2019: Hemoglobin 12.1;  Platelets 201.0 10/31/2019: TSH 9.48    Lipid Panel Lab Results  Component Value Date   CHOL 148 06/26/2019   HDL 60.00 06/26/2019   LDLCALC 69 06/26/2019   TRIG 93.0 06/26/2019      Wt Readings from Last 3 Encounters:  11/27/19 226 lb 8 oz (102.7  kg)  11/21/19 225 lb (102.1 kg)  10/31/19 229 lb (103.9 kg)       ASSESSMENT AND PLAN:   Chronic diastolic CHF (congestive heart failure) ([place]) - Plan: EKG 12-Lead No significant leg edema but she does report abdominal fullness Recommend she take Lasix sparingly for symptoms, might help with her breathing Currently does not take Lasix at all  Essential hypertension - Plan: EKG 12-Lead Blood pressure is well controlled on today's visit. No changes made to the medications.  Lower extremity swelling  Leg swelling resolved on today's visit  COPD exacerbation (HCC) On chronic oxygen , inhalers/Symbicort No recent COPD exacerbation Very debilitated, followed by pulmonary  Shortness of breath Multifactorial with underlying lung disease/COPD, morbid obesity, deconditioning, chronic diastolic CHF Unable to lose weight Limited by hypoxia May need to increase up to 3 to 4 L of oxygen when very active then back down to 2  Type 2 diabetes mellitus with diabetic neuropathy, without long-term current use of insulin (HCC) Recommend low carbohydrate diet  Morbid obesity ([place])  encourage careful diet management in an effort to lose weight.   Total encounter time more than 25 minutes  Greater than 50% was spent in counseling and coordination of care with the patient   Orders Placed This Encounter  Procedures  . EKG 12-Lead     Signed, Esmond Plants, M.D., Ph.D. 11/27/2019  [place], [place]

## 2019-11-27 ENCOUNTER — Other Ambulatory Visit: Payer: Self-pay

## 2019-11-27 ENCOUNTER — Encounter: Payer: Self-pay | Admitting: Cardiovascular Disease

## 2019-11-27 ENCOUNTER — Ambulatory Visit (INDEPENDENT_AMBULATORY_CARE_PROVIDER_SITE_OTHER): Payer: Medicare Other | Admitting: Cardiovascular Disease

## 2019-11-27 VITALS — BP 130/60 | HR 71 | Ht 65.0 in | Wt 226.5 lb

## 2019-11-27 DIAGNOSIS — I10 Essential (primary) hypertension: Secondary | ICD-10-CM

## 2019-11-27 DIAGNOSIS — I2510 Atherosclerotic heart disease of native coronary artery without angina pectoris: Secondary | ICD-10-CM

## 2019-11-27 DIAGNOSIS — I5032 Chronic diastolic (congestive) heart failure: Secondary | ICD-10-CM

## 2019-11-27 DIAGNOSIS — E782 Mixed hyperlipidemia: Secondary | ICD-10-CM

## 2019-11-27 MED ORDER — FUROSEMIDE 20 MG PO TABS: 20.0000 mg | ORAL_TABLET | Freq: Every day | ORAL | 1 refills | Status: DC | PRN

## 2019-11-27 NOTE — Patient Instructions (Signed)
Medication Instructions:  No changes Lasix as needed, sparingly  If you need a refill on your cardiac medications before your next appointment, please call your pharmacy.    Lab work: No new labs needed   If you have labs (blood work) drawn today and your tests are completely normal, you will receive your results only by: Marland Kitchen MyChart Message (if you have MyChart) OR . A paper copy in the mail If you have any lab test that is abnormal or we need to change your treatment, we will call you to review the results.   Testing/Procedures: No new testing needed   Follow-Up: At [facility], you and your health needs are our priority.  As part of our continuing mission to provide you with exceptional heart care, we have created designated Provider Care Teams.  These Care Teams include your primary Cardiologist (physician) and Advanced Practice Providers (APPs -  Physician Assistants and Nurse Practitioners) who all work together to provide you with the care you need, when you need it.  . You will need a follow up appointment in 6 months  . Providers on your designated Care Team:   . Murray Hodgkins, NP . Christell Faith, PA-C . Marrianne Mood, PA-C  Any Other Special Instructions Will Be Listed Below (If Applicable).  COVID-19 Vaccine Information can be found at: [email or web address].co.uk For questions related to vaccine distribution or appointments, please email [email or web address] or call [phone].

## 2019-12-03 ENCOUNTER — Inpatient Hospital Stay: Payer: Medicare Other | Attending: Radiation Oncology

## 2019-12-03 ENCOUNTER — Encounter (HOSPITAL_COMMUNITY): Payer: Self-pay

## 2019-12-03 ENCOUNTER — Ambulatory Visit (HOSPITAL_COMMUNITY)
Admission: RE | Admit: 2019-12-03 | Discharge: 2019-12-03 | Disposition: A | Discharge status: Home / Self Care | Payer: Medicare Other | Source: Ambulatory Visit | Attending: Radiation Oncology | Admitting: Radiation Oncology

## 2019-12-03 ENCOUNTER — Other Ambulatory Visit: Payer: Self-pay

## 2019-12-03 DIAGNOSIS — C3411 Malignant neoplasm of upper lobe, right bronchus or lung: Secondary | ICD-10-CM | POA: Diagnosis not present

## 2019-12-03 DIAGNOSIS — I700 Atherosclerosis of aorta: Secondary | ICD-10-CM | POA: Diagnosis not present

## 2019-12-03 DIAGNOSIS — D1809 Hemangioma of other sites: Secondary | ICD-10-CM | POA: Diagnosis not present

## 2019-12-03 DIAGNOSIS — I2510 Atherosclerotic heart disease of native coronary artery without angina pectoris: Secondary | ICD-10-CM | POA: Diagnosis not present

## 2019-12-03 DIAGNOSIS — J432 Centrilobular emphysema: Secondary | ICD-10-CM | POA: Diagnosis not present

## 2019-12-03 LAB — BUN & CREATININE (CHCC)
BUN: 18 mg/dL (ref 8–23)
Creatinine: 0.86 mg/dL (ref 0.44–1.00)
GFR, Est AFR Am: >60 mL/min (ref >60)
GFR, Estimated: >60 mL/min (ref >60)

## 2019-12-03 MED ORDER — IOHEXOL 300 MG/ML  SOLN
100.0000 mL | Freq: Once | INTRAMUSCULAR | Status: AC | PRN
  Administered 2019-12-03: 100 mL via INTRAVENOUS

## 2019-12-04 ENCOUNTER — Telehealth: Payer: Self-pay | Admitting: Radiation Oncology

## 2019-12-04 NOTE — Telephone Encounter (Signed)
I called the patient to let her know the favorable findings from her CT scan and the plan to proceed with repeat imaging in 6 months time. I couldn't reach her due to her VM not being set up. I called her daughter and reviewed the CT films with her as well but she requested we have a dedicated meeting time tomorrow to review with her mother. I will try to call back around 10:30 am.

## 2019-12-05 ENCOUNTER — Other Ambulatory Visit: Payer: Self-pay

## 2019-12-05 ENCOUNTER — Ambulatory Visit (INDEPENDENT_AMBULATORY_CARE_PROVIDER_SITE_OTHER): Payer: Medicare Other

## 2019-12-05 DIAGNOSIS — Z23 Encounter for immunization: Secondary | ICD-10-CM

## 2019-12-06 ENCOUNTER — Telehealth: Payer: Self-pay | Admitting: Radiation Oncology

## 2019-12-06 DIAGNOSIS — C3411 Malignant neoplasm of upper lobe, right bronchus or lung: Secondary | ICD-10-CM

## 2019-12-06 NOTE — Telephone Encounter (Signed)
I called the patient's daughter this morning so we could discuss her mother's CT chest. We will plan repeat scan in 6 months.

## 2019-12-25 ENCOUNTER — Ambulatory Visit: Payer: PRIVATE HEALTH INSURANCE | Admitting: Pulmonary Disease

## 2020-01-18 DIAGNOSIS — Z23 Encounter for immunization: Secondary | ICD-10-CM | POA: Diagnosis not present

## 2020-01-22 DIAGNOSIS — F4323 Adjustment disorder with mixed anxiety and depressed mood: Secondary | ICD-10-CM | POA: Diagnosis not present

## 2020-01-25 ENCOUNTER — Ambulatory Visit (INDEPENDENT_AMBULATORY_CARE_PROVIDER_SITE_OTHER): Payer: Medicare Other | Admitting: Family Medicine

## 2020-01-25 ENCOUNTER — Encounter: Payer: Self-pay | Admitting: Family Medicine

## 2020-01-25 ENCOUNTER — Other Ambulatory Visit: Payer: Self-pay

## 2020-01-25 VITALS — BP 130/68 | HR 73 | Temp 97.5°F | Wt 220.4 lb

## 2020-01-25 DIAGNOSIS — I739 Peripheral vascular disease, unspecified: Secondary | ICD-10-CM

## 2020-01-25 DIAGNOSIS — J9611 Chronic respiratory failure with hypoxia: Secondary | ICD-10-CM

## 2020-01-25 DIAGNOSIS — D649 Anemia, unspecified: Secondary | ICD-10-CM

## 2020-01-25 DIAGNOSIS — F419 Anxiety disorder, unspecified: Secondary | ICD-10-CM

## 2020-01-25 DIAGNOSIS — I2510 Atherosclerotic heart disease of native coronary artery without angina pectoris: Secondary | ICD-10-CM

## 2020-01-25 DIAGNOSIS — E039 Hypothyroidism, unspecified: Secondary | ICD-10-CM

## 2020-01-25 DIAGNOSIS — R918 Other nonspecific abnormal finding of lung field: Secondary | ICD-10-CM | POA: Diagnosis not present

## 2020-01-25 NOTE — Patient Instructions (Addendum)
Go to the lab on the way out.   If you have mychart we'll likely use that to update you.    Don't change your meds for now.  Let me update endocrinology, pulmonary and psychiatry.   Take care.  Glad to see you.

## 2020-01-25 NOTE — Progress Notes (Signed)
This visit occurred during the SARS-CoV-2 public health emergency.  Safety protocols were in place, including screening questions prior to the visit, additional usage of staff PPE, and extensive cleaning of exam room while observing appropriate contact time as indicated for disinfecting solutions.  "I'm not used to being 84."  She has more help requirements, needing more help in daily life.  She is more irritable and panicked. Mult friends have died, her dog died.  She has lost independence and that is frustrating for patient.  She has extra help at home.      Now on prozac and vraylar and buspar.  She has seen [name] with [facility].   She needs update about condition.  She has f/u with counseling pending.  She has chronic illnesses and her overall level of debility has been a source of frustration/significant adjustment for her.  Discussed.  S/p rady treatment.  She has pulmonary f/u pending.    H/o abnormal TSH.  Taking levothyroxine 162mcg daily.  Due for f/u labs.    She has needed SABA more often.  Still on BID symbicort.  I told her I would ask pulmonary about options given prev intolerances.    Meds, vitals, and allergies reviewed.   ROS: Per HPI unless specifically indicated in ROS section   nad Chronically ill-appearing lady on nasal cannula oxygen at baseline. Neck supple, no LA RRR Scant exp wheeze bilaterally but otherwise clear to auscultation.  No increased work of breathing.  Speaking in complete sentences. Abdomen soft.  Not tender. Chronic BLE shin changes.    Diabetic foot exam: Normal inspection No skin breakdown No calluses  Dec but palpable B DP pulses Dec sensation to light touch and monofilament Nails normal

## 2020-01-26 LAB — CBC WITH DIFFERENTIAL/PLATELET
Absolute Monocytes: 710 cells/uL (ref 200–950)
Basophils Absolute: 30 cells/uL (ref 0–200)
Basophils Relative: 0.4 %
Eosinophils Absolute: 96 cells/uL (ref 15–500)
Eosinophils Relative: 1.3 %
HCT: 29.9 % — ABNORMAL LOW (ref 35.0–45.0)
Hemoglobin: 9.7 g/dL — ABNORMAL LOW (ref 11.7–15.5)
Lymphs Abs: 1206 cells/uL (ref 850–3900)
MCH: 30.5 pg (ref 27.0–33.0)
MCHC: 32.4 g/dL (ref 32.0–36.0)
MCV: 94 fL (ref 80.0–100.0)
MPV: 11 fL (ref 7.5–12.5)
Monocytes Relative: 9.6 %
Neutro Abs: 5358 cells/uL (ref 1500–7800)
Neutrophils Relative %: 72.4 %
Platelets: 227 10*3/uL (ref 140–400)
RBC: 3.18 10*6/uL — ABNORMAL LOW (ref 3.80–5.10)
RDW: 12.4 % (ref 11.0–15.0)
Total Lymphocyte: 16.3 %
WBC: 7.4 10*3/uL (ref 3.8–10.8)

## 2020-01-26 LAB — COMPREHENSIVE METABOLIC PANEL
AG Ratio: 1.2 (calc) (ref 1.0–2.5)
ALT: 9 U/L (ref 6–29)
AST: 11 U/L (ref 10–35)
Albumin: 3.7 g/dL (ref 3.6–5.1)
Alkaline phosphatase (APISO): 77 U/L (ref 37–153)
BUN: 22 mg/dL (ref 7–25)
CO2: 28 mmol/L (ref 20–32)
Calcium: 9.1 mg/dL (ref 8.6–10.4)
Chloride: 102 mmol/L (ref 98–110)
Creat: 0.86 mg/dL (ref 0.60–0.88)
Globulin: 3 g/dL (calc) (ref 1.9–3.7)
Glucose, Bld: 96 mg/dL (ref 65–99)
Potassium: 4.7 mmol/L (ref 3.5–5.3)
Sodium: 140 mmol/L (ref 135–146)
Total Bilirubin: 0.4 mg/dL (ref 0.2–1.2)
Total Protein: 6.7 g/dL (ref 6.1–8.1)

## 2020-01-26 LAB — T4, FREE: Free T4: 1.7 ng/dL (ref 0.8–1.8)

## 2020-01-26 LAB — TSH: TSH: 0.29 mIU/L — ABNORMAL LOW (ref 0.40–4.50)

## 2020-01-26 LAB — IRON: Iron: 34 ug/dL — ABNORMAL LOW (ref 45–160)

## 2020-01-27 DIAGNOSIS — I739 Peripheral vascular disease, unspecified: Secondary | ICD-10-CM | POA: Insufficient documentation

## 2020-01-27 NOTE — Assessment & Plan Note (Signed)
Presumed with decreased but palpable bilateral dorsalis pedis pulses.  I will update cardiology as FYI, but I do not think she would be a good candidate for any interventional procedures at this point, especially since she does not have ulceration or obvious limb threatening ischemia.

## 2020-01-27 NOTE — Assessment & Plan Note (Signed)
History of abnormal TSH.  Taking 150 mcg of levothyroxine daily.  See notes on follow-up labs.

## 2020-01-27 NOTE — Assessment & Plan Note (Signed)
Status post bradycardia treatment per outside clinic.  I will defer.  Discussed.

## 2020-01-27 NOTE — Assessment & Plan Note (Addendum)
Versus depression versus adjustment reaction versus some combination of the above.  We will update psychiatry/ask for input.  I did not change her medications at this point.  No suicidal homicidal intent still okay for outpatient follow-up.

## 2020-01-27 NOTE — Assessment & Plan Note (Signed)
She has had increased need for albuterol recently in spite of taking Symbicort and continued oxygen use.  I will ask pulmonary for input.  I did not change her medications.

## 2020-01-28 ENCOUNTER — Other Ambulatory Visit: Payer: Self-pay | Admitting: Internal Medicine

## 2020-01-28 ENCOUNTER — Encounter: Payer: Self-pay | Admitting: Internal Medicine

## 2020-01-28 DIAGNOSIS — E039 Hypothyroidism, unspecified: Secondary | ICD-10-CM

## 2020-01-30 ENCOUNTER — Other Ambulatory Visit: Payer: Self-pay

## 2020-01-30 ENCOUNTER — Ambulatory Visit (INDEPENDENT_AMBULATORY_CARE_PROVIDER_SITE_OTHER): Payer: Medicare Other | Admitting: Pulmonary Disease

## 2020-01-30 ENCOUNTER — Other Ambulatory Visit: Payer: Self-pay | Admitting: Internal Medicine

## 2020-01-30 ENCOUNTER — Encounter: Payer: Self-pay | Admitting: Pulmonary Disease

## 2020-01-30 VITALS — BP 132/72 | HR 88 | Temp 97.2°F | Ht 62.0 in | Wt 220.0 lb

## 2020-01-30 DIAGNOSIS — F339 Major depressive disorder, recurrent, unspecified: Secondary | ICD-10-CM

## 2020-01-30 DIAGNOSIS — R600 Localized edema: Secondary | ICD-10-CM

## 2020-01-30 DIAGNOSIS — R5381 Other malaise: Secondary | ICD-10-CM

## 2020-01-30 DIAGNOSIS — J9611 Chronic respiratory failure with hypoxia: Secondary | ICD-10-CM

## 2020-01-30 DIAGNOSIS — F419 Anxiety disorder, unspecified: Secondary | ICD-10-CM

## 2020-01-30 DIAGNOSIS — D509 Iron deficiency anemia, unspecified: Secondary | ICD-10-CM

## 2020-01-30 DIAGNOSIS — I5032 Chronic diastolic (congestive) heart failure: Secondary | ICD-10-CM

## 2020-01-30 DIAGNOSIS — C3411 Malignant neoplasm of upper lobe, right bronchus or lung: Secondary | ICD-10-CM | POA: Diagnosis not present

## 2020-01-30 DIAGNOSIS — R0602 Shortness of breath: Secondary | ICD-10-CM

## 2020-01-30 DIAGNOSIS — E039 Hypothyroidism, unspecified: Secondary | ICD-10-CM

## 2020-01-30 DIAGNOSIS — G4733 Obstructive sleep apnea (adult) (pediatric): Secondary | ICD-10-CM

## 2020-01-30 DIAGNOSIS — J449 Chronic obstructive pulmonary disease, unspecified: Secondary | ICD-10-CM

## 2020-01-30 DIAGNOSIS — Z0000 Encounter for general adult medical examination without abnormal findings: Secondary | ICD-10-CM

## 2020-01-30 DIAGNOSIS — J310 Chronic rhinitis: Secondary | ICD-10-CM | POA: Diagnosis not present

## 2020-01-30 NOTE — Assessment & Plan Note (Signed)
I believe this may be the largest component of patient's symptoms.  Patient currently established with psychiatry.  They are working on titrating her medications up for better management of her depression as well as anxiety.  I explained the patient today that worsened anxiety can affect dyspnea.  Plan: Continue follow-up with psychiatry Continue medications as outlined by them

## 2020-01-30 NOTE — Assessment & Plan Note (Signed)
Plan: Continue medications as outlined by primary care Continue levothyroxine

## 2020-01-30 NOTE — Assessment & Plan Note (Signed)
Known obstructive sleep apnea Patient intolerant of CPAP therapy  Plan: continue O2 at night

## 2020-01-30 NOTE — Assessment & Plan Note (Signed)
Plan: Continue follow-up with radiation oncology Repeat CT chest in March/2022 as outlined by radiation oncology

## 2020-01-30 NOTE — Assessment & Plan Note (Signed)
Shortness of breath is multifactorial: Status post SBRT, known COPD, Physical deconditioning, iron deficiency anemia, hypothyroidism, anxiety, depression, former smoker.  BMI 40.2  Plan: Walk today in office patient qualified for 3 L of O2 with physical exertion Continue oxygen therapy Continue Symbicort 160 Referral to pulmonary rehab Keep follow-up with cardiology, primary care, psychiatry

## 2020-01-30 NOTE — Patient Instructions (Signed)
You were seen today by Lauraine Rinne, NP  for:   1. COPD GOLD II  Continue Symbicort 160 >>> 2 puffs in the morning right when you wake up, rinse out your mouth after use, 12 hours later 2 puffs, rinse after use >>> Take this daily, no matter what >>> This is not a rescue inhaler   Only use your albuterol as a rescue medication to be used if you can't catch your breath by resting or doing a relaxed purse lip breathing pattern.  - The less you use it, the better it will work when you need it. - Ok to use up to 2 puffs  every 4 hours if you must but call for immediate appointment if use goes up over your usual need - Don't leave home without it !!  (think of it like the spare tire for your car)   Note your daily symptoms > remember "red flags" for COPD:   >>>Increase in cough >>>increase in sputum production >>>increase in shortness of breath or activity  intolerance.   If you notice these symptoms, please call the office to be seen.   2. Chronic respiratory failure with hypoxia (HCC)  Walk today in office, patient did qualify for 3 L of O2 with physical exertion  Patient can use 2 L of O2 at rest and at night Patient needs 3 L of O2 with physical exertion  Continue oxygen therapy as prescribed  >>>maintain oxygen saturations greater than 88 percent  >>>if unable to maintain oxygen saturations please contact the office  >>>do not smoke with oxygen  >>>can use nasal saline gel or nasal saline rinses to moisturize nose if oxygen causes dryness   3. Chronic rhinitis  Continue Allegra  4. Malignant neoplasm of right upper lobe of lung [facility])  Follow-up CT imaging as outlined by radiation oncology  5. Obstructive sleep apnea  We respect her decision to not utilize CPAP therapy  6. SOB (shortness of breath)  I believe your shortness of breath is likely multifactorial given physical deconditioning, diastolic congestive heart failure, anxiety, known COPD, recent radiation  treatment, and anxiety.  7. Physical deconditioning  Continue to work on increasing your overall physical activity  Goal should be 30 minutes of physical activity a day  Can start small and do 5 minutes of walking a day and add on a minute each week  We will refer you to pulmonary rehab for your COPD  8. Lower extremity edema 9. Chronic diastolic CHF (congestive heart failure) (HCC)  Keep follow-up with cardiology  Continue your fluid pills as outlined  Continue to weigh yourself regularly  Discussed with cardiology at next office visit in 2 weeks whether or not you should be taking your Lasix if you are noticing weight increases when you weigh daily  10. Anxiety 11. Depression, recurrent ([place])  Continue to work with psychiatry  Continue medications as outlined by psychiatry  Keep follow-up with our office next month  12. Iron deficiency anemia, unspecified iron deficiency anemia type  Continue to work with primary care  Being anemic and also affect her shortness of breath  13. Hypothyroidism, unspecified type  Continue to work with primary care  Continue levothyroxine  14. Healthcare maintenance  Great job being up-to-date with his COVID-19 vaccinations  Great job being up-to-date with her seasonal flu vaccine  We will refer you to pulmonary rehab to work on increasing your overall physical mobility   Follow Up:    Return in about  3 months (around 05/01/2020), or if symptoms worsen or fail to improve, for Follow up with Dr. Halford Chessman.   Notification of test results are managed in the following manner: If there are  any recommendations or changes to the  plan of care discussed in office today,  we will contact you and let you know what they are. If you do not hear from [place], then your results are normal and you can view them through your  MyChart account , or a letter will be sent to you. Thank you again for trusting [place] with your care  - Thank you, [place] [facility]    It is flu season:   >>> Best ways to protect herself from the flu: Receive the yearly flu vaccine, practice good hand hygiene washing with soap and also using hand sanitizer when available, eat a nutritious meals, get adequate rest, hydrate appropriately       Please contact the office if your symptoms worsen or you have concerns that you are not improving.   Thank you for choosing [facility] for your healthcare, and for allowing [place] to partner with you on your healthcare journey. I am thankful to be able to provide care to you today.   Wyn Quaker FNP-C    COPD and Physical Activity Chronic obstructive pulmonary disease (COPD) is a long-term (chronic) condition that affects the lungs. COPD is a general term that can be used to describe many different lung problems that cause lung swelling (inflammation) and limit airflow, including chronic bronchitis and emphysema. The main symptom of COPD is shortness of breath, which makes it harder to do even simple tasks. This can also make it harder to exercise and be active. Talk with your health care provider about treatments to help you breathe better and actions you can take to prevent breathing problems during physical activity. What are the benefits of exercising with COPD? Exercising regularly is an important part of a healthy lifestyle. You can still exercise and do physical activities even though you have COPD. Exercise and physical activity improve your shortness of breath by increasing blood flow (circulation). This causes your heart to pump more oxygen through your body. Moderate exercise can improve your:  Oxygen use.  Energy level.  Shortness of breath.  Strength in your breathing muscles.  Heart health.  Sleep.  Self-esteem and feelings of self-worth.  Depression, stress, and anxiety levels. Exercise can benefit everyone with COPD. The severity of your disease may affect how hard you can exercise,  especially at first, but everyone can benefit. Talk with your health care provider about how much exercise is safe for you, and which activities and exercises are safe for you. What actions can I take to prevent breathing problems during physical activity?  Sign up for a pulmonary rehabilitation program. This type of program may include: ? Education about lung diseases. ? Exercise classes that teach you how to exercise and be more active while improving your breathing. This usually involves:  Exercise using your lower extremities, such as a stationary bicycle.  About 30 minutes of exercise, 2 to 5 times per week, for 6 to 12 weeks  Strength training, such as push ups or leg lifts. ? Nutrition education. ? Group classes in which you can talk with others who also have COPD and learn ways to manage stress.  If you use an oxygen tank, you should use it while you exercise. Work with your health care provider to adjust your oxygen for  your physical activity. Your resting flow rate is different from your flow rate during physical activity.  While you are exercising: ? Take slow breaths. ? Pace yourself and do not try to go too fast. ? Purse your lips while breathing out. Pursing your lips is similar to a kissing or whistling position. ? If doing exercise that uses a quick burst of effort, such as weight lifting:  Breathe in before starting the exercise.  Breathe out during the hardest part of the exercise (such as raising the weights). Where to find support You can find support for exercising with COPD from:  Your health care provider.  A pulmonary rehabilitation program.  Your local health department or community health programs.  Support groups, online or in-person. Your health care provider may be able to recommend support groups. Where to find more information You can find more information about exercising with COPD from:  American Lung Association: ClassInsider.se.  COPD Foundation:  https://www.rivera.net/. Contact a health care provider if:  Your symptoms get worse.  You have chest pain.  You have nausea.  You have a fever.  You have trouble talking or catching your breath.  You want to start a new exercise program or a new activity. Summary  COPD is a general term that can be used to describe many different lung problems that cause lung swelling (inflammation) and limit airflow. This includes chronic bronchitis and emphysema.  Exercise and physical activity improve your shortness of breath by increasing blood flow (circulation). This causes your heart to provide more oxygen to your body.  Contact your health care provider before starting any exercise program or new activity. Ask your health care provider what exercises and activities are safe for you. This information is not intended to replace advice given to you by your health care provider. Make sure you discuss any questions you have with your health care provider. Document Revised: 06/14/2018 Document Reviewed: 03/17/2017 Elsevier Patient Education  2020 [employer].

## 2020-01-30 NOTE — Assessment & Plan Note (Signed)
Plan: Continue follow-up with cardiology Continue Lasix as needed

## 2020-01-30 NOTE — Assessment & Plan Note (Signed)
Sedentary lifestyle BMI 40.2 Ongoing dyspnea on exertion Physical deconditioning is a component of this  Plan: Work on increasing overall physical mobility Referral to pulmonary rehab

## 2020-01-30 NOTE — Assessment & Plan Note (Signed)
History of iron deficiency anemia  Plan: Continue follow-up with primary care Continue iron

## 2020-01-30 NOTE — Assessment & Plan Note (Signed)
Plan: Walk today in office, patient qualified for 3 L of O2 with physical exertion Continue 2 L of O2 at rest and at nighttime We will send order to DME company for patient to be on to safely increase her O2 to 3 L with physical exertion

## 2020-01-30 NOTE — Progress Notes (Signed)
@Patient  ID: Alexandria [name], female    DOB: 1936-02-08, 84 y.o.   MRN: [id number]  Chief Complaint  Patient presents with  . Follow-up    COPD, more anxiety attacks with SOB    Referring provider: Tonia Ghent, MD  HPI:  84 y.o. female with dyspnea from COPD and OSA   PMH: Diastolic CHF, deconditioning, and IDA Smoking history: Former smoker.  Quit 2006.  100-pack-year smoking history. Maintenance: Symbicort 160 Pt of: Dr. Halford Chessman   01/30/2020  - Visit   84 year old female former smoker followed in our office for chronic respiratory failure obstructive sleep apnea and COPD.  She is established with Dr. Halford Chessman.  She remains adherent to Symbicort 160.  She is presenting to her office today due to concerns of her oxygen levels.  Patient presented to our office today on oxygen but her oxygen was off and her oxygen saturations were stable at 93%.  Patient having significant amounts of anxiety regarding dyspnea on exertion and doing normal IADLs and ADLs.  Patient was last seen in our office by Dr. Halford Chessman in July/2021.  Plan of care there was to follow-up in 3 months.  She was planning to start SBRT due to right upper lobe lung nodule with a presumptive diagnosis of lung cancer.,  She was encouraged remain on Symbicort.  She was encouraged to remain on 2 L of O2 24/7.  Patient continues to work with primary care.  She was last seen on 01/25/2020 by Dr. [name].  She continues to struggle with iron deficiency anemia, depression and hypothyroidism.  Patient continues to work with radiation oncology.  They will have a repeat CT chest in 6 months.  Tentative date around March/2022.  Patient is also established with cardiology.  She reports that she has an upcoming appoint with them in 2 weeks.  She is currently using Lasix as needed.  She takes this if she notices lower extremity swelling.  She does weigh herself regularly.  Patient reporting dyspnea on exertion with walking 25 feet.  This is  required her to increase her oxygen needs to 3 to 4 L.  We will discuss this today  Walk today in office patient did require 3 L with physical exertion.  Patient reports she has been taking her rescue inhaler 6-7 times a day she does not feel this has been helping.  We will discuss this today.  Patient is up-to-date with COVID-19 vaccinations and seasonal flu vaccine.  Questionaires / Pulmonary Flowsheets:   ACT:  No flowsheet data found.  MMRC: mMRC Dyspnea Scale mMRC Score  01/30/2020 3    Epworth:  No flowsheet data found.  Tests:   Pulmonary tests:  RAST 10/02/10 >> negative, IgE 8.1 A1AT 04/15/11 >> MM PFT 10/512 >> FEV1 1.29 (61%), FEV1% 57, TLC 6.01 (119%), RV 3.52 (153%), DLCO 67%, no BD PFT 08/15/14 >> FEV1 1.07 (57%), FEV1% 55, TLC 6.49 (128%), DLCO 47%, no BD  Chest Imaging:  CT chest 06/29/16 >> atherosclerosis, RUL nodule 7 mm (was 8 mm), no change 6 mm LUL nodule, no change 4 mm RLL nodule  CT chest 12/23/17 >> 10 mm RUL nodule, 7 mm RUL nodule, 4 mm RLL nodule, 6 mm LUL nodule, centrilobular emphysema PET scan 01/06/18 >> RUL nodule not hypermetabolic  Sleep tests:  PSG 01/27/10 >> AHI 13, SpO2 low 73% ONO with CPAP 08/09/14 >>Test time 9 hr 16 min. Mean SpO2 92.2%, low SpO2 86%. Spent 10 min with SpO2 <88% ONO  with CPAP 06/24/16 >>test time 7 hrs 29 min. Average SpO2 90%, low SpO2 79%. Spent 58 min with SpO2 <88%. ONO with RA 11/01/16 >>test time 6 hrs 43 min. Average SpO2 89%, low SpO2 67%. Spent 2 hrs 7 min with SpO2 <88%.    FENO:  No results found for: NITRICOXIDE  PFT: PFT Results Latest Ref Rng & Units 08/15/2014  FVC-Pre L 1.92  FVC-Predicted Pre % 70  FVC-Post L 1.95  FVC-Predicted Post % 71  Pre FEV1/FVC % % 53  Post FEV1/FCV % % 55  FEV1-Pre L 1.01  FEV1-Predicted Pre % 49  FEV1-Post L 1.07  DLCO uncorrected ml/min/mmHg 11.56  DLCO UNC% % 47  DLVA Predicted % 70  TLC L 6.49  TLC % Predicted % 128  RV % Predicted % 188     WALK:  SIX MIN WALK 12/28/2018 01/12/2016 07/31/2014 08/24/2012 10/02/2010  Supplimental Oxygen during Test? (L/min) No No No No No  Tech Comments: Patient was off of the 3L of oxygen while in the room for a total of 18 minutes. Patient O2 was already down to 88 before the walk started. Pt. walked at a slow steady pace, she was only able to do 1 lap due to dyspnea Pt's lowest ambulatory stat was 92 on RA. Pt walked at a slower pace with three breaks.  Test stopped due to "toes cramping" and increased SOB although the pt did not appear to be dyspenic//lmr test completed.  pt did c/o feeling light headed, tightness in chest, and burning in throat while walking.     Imaging: No results found.  Lab Results:  CBC    Component Value Date/Time   WBC 7.4 01/25/2020 1450   RBC 3.18 (L) 01/25/2020 1450   HGB 9.7 (L) 01/25/2020 1450   HGB 10.7 (L) 05/23/2019 1141   HCT 29.9 (L) 01/25/2020 1450   HCT 32.9 (L) 05/23/2019 1141   PLT 227 01/25/2020 1450   PLT 180 05/23/2019 1141   MCV 94.0 01/25/2020 1450   MCV 95 05/23/2019 1141   MCH 30.5 01/25/2020 1450   MCHC 32.4 01/25/2020 1450   RDW 12.4 01/25/2020 1450   RDW 12.8 05/23/2019 1141   LYMPHSABS 1,206 01/25/2020 1450   MONOABS 0.5 07/19/2019 1306   EOSABS 96 01/25/2020 1450   BASOSABS 30 01/25/2020 1450    BMET    Component Value Date/Time   NA 140 01/25/2020 1450   K 4.7 01/25/2020 1450   CL 102 01/25/2020 1450   CO2 28 01/25/2020 1450   GLUCOSE 96 01/25/2020 1450   BUN 22 01/25/2020 1450   CREATININE 0.86 01/25/2020 1450   CALCIUM 9.1 01/25/2020 1450   GFRNONAA >60 12/03/2019 1212   GFRAA >60 12/03/2019 1212    BNP No results found for: BNP  ProBNP    Component Value Date/Time   PROBNP 34.0 08/24/2012 1621    Specialty Problems      Pulmonary Problems   COPD GOLD II    Followed in Pulmonary clinic/ [facility]/ [facility] - PFT's 12/12/2002 FEV1 1.42 (64%) ratio 58 with no better after B2 and DLCO 75%  - PFT's  9/ 15/11 FEV1 1.50 (73%) ratio 50 no better after B2 with DLC0 62%  - PFT's 10/10/2012   1.29 (61%) ratio 57 and no better p B2, DLCO  67% and corrects to 81  >>referred to pulmonary rehab 10/19/2010 > some better doe - Alpha one AT genotype 04/15/2011 > MM -   08/24/2012  Walked  RA x 1 laps @ 185 ft each stopped due to feet hurt > sob s desat - 10/17/2014 p extensive coaching HFA effectiveness =    90%       Obstructive sleep apnea    Qualifier: Diagnosis of  By: Halford Chessman MD, [place]        SOB (shortness of breath)    Followed in Pulmonary clinic/ [facility]/ [place]  - 08/24/2012  Walked RA x 1 laps @ 185 ft each stopped due to sob/feet hurt      COPD exacerbation (HCC)   Obesity hypoventilation syndrome (HCC)   Chronic respiratory failure with hypoxia (HCC)   Rhinitis   Malignant neoplasm of right upper lobe of lung (HCC)   Sore throat      Allergies  Allergen Reactions  . Antihistamines, Diphenhydramine-Type Other (See Comments)    Able to tolerate only allegra.   . Sulfonamide Derivatives Swelling    REACTION: closed throat  . Incruse Ellipta [Umeclidinium Bromide] Cough    Caused voice change and severe coughing  . Clarithromycin Other (See Comments)    REACTION: diff swallowing and mouth blisters  . Codeine Other (See Comments)    Unknown   . Lyrica [Pregabalin] Other (See Comments)    Lack of effect for neuropathy pain.    Marland Kitchen Spiriva Handihaler [Tiotropium Bromide Monohydrate] Other (See Comments)    Voice changes    Immunization History  Administered Date(s) Administered  . DT (Pediatric) 12/23/2010  . Fluad Quad(high Dose 65+) 12/28/2018, 12/05/2019  . H1N1 05/30/2008  . Influenza Split 12/23/2010, 12/16/2011, 01/08/2013  . Influenza Whole 12/07/1995, 12/07/2005, 01/04/2008, 01/06/2010  . Influenza, High Dose Seasonal PF 01/06/2016, 01/06/2017  . Influenza,inj,Quad PF,6+ Mos 12/26/2013, 12/05/2014, 11/28/2017  . PFIZER SARS-COV-2 Vaccination 03/27/2019,  04/18/2019, 01/18/2020  . PPD Test 10/02/2014  . Pneumococcal Conjugate-13 08/17/2013  . Pneumococcal Polysaccharide-23 12/06/2000, 03/08/2005, 10/24/2008, 12/28/2018  . Td 03/08/1993, 10/04/2000, 12/23/2010  . Tdap 10/01/2016  . Zoster 12/07/2005    Past Medical History:  Diagnosis Date  . Allergy, unspecified not elsewhere classified   . Anxiety   . Cataract   . Chronic diastolic congestive heart failure ([place])   . COPD (chronic obstructive pulmonary disease) ([place]) 03/08/1998   PFTs 12/12/2002 FEV 1 1.42 (64%) ratio 58 with no better after B2 and DLCO75%; PFTs 11/20/09 FEV1 1.50 (73%) ratio 50 no better after B2 with DLCO 62%; Hfa 50% 11/20/2009 >75%, 01/13/10 p coaching  . Depression 12/07/1998  . Diabetes mellitus type II 02/05/2006   Dr. Cruzita Lederer with endo  . Disorders of bursae and tendons in shoulder region, unspecified    Rotator cuff syndrome, right  . E. coli bacteremia   . Esophagitis   . GERD (gastroesophageal reflux disease)   . History of UTI   . Hyperlipidemia 10/04/2000  . Hypertension 03/08/1992  . Hypothyroidism 03/08/1968  . Iron deficiency anemia   . Lung nodule    radiation starts 10-08-2019  . Malignant neoplasm of right upper lobe of lung ([place]) 07/24/2019  . Obesity    NOS  . OSA (obstructive sleep apnea)    PSG 01/27/10 AHI 13, pt does not know CPAP settings  . Peripheral neuropathy    Likely due to DM per Dr. Erling Cruz  . Ventral hernia     Tobacco History: Social History   Tobacco Use  Smoking Status Former Smoker  . Packs/day: 2.00  . Years: 50.00  . Pack years: 100.00  . Types: Cigarettes  . Quit date: 02/05/2005  .  Years since quitting: 14.9  Smokeless Tobacco Never Used   Counseling given: Not Answered   Continue to not smoke  Outpatient Encounter Medications as of 01/30/2020  Medication Sig  . albuterol (PROAIR HFA) 108 (90 Base) MCG/ACT inhaler Inhale 2 puffs into the lungs every 6 (six) hours as needed for wheezing or shortness of  breath. INHALE 2 PUFFS INTO THE LUNGS EVERY 6 HOURS AS NEEDED FOR WHEEZING OR SHORTNESS OF BREATH  . budesonide-formoterol (SYMBICORT) 160-4.5 MCG/ACT inhaler Inhale 2 puffs into the lungs 2 (two) times daily.  . busPIRone (BUSPAR) 5 MG tablet 15 mg 3 (three) times daily. As needed  . cariprazine (VRAYLAR) capsule Take 1.5 mg by mouth daily.  . Cholecalciferol (VITAMIN D) 50 MCG (2000 UT) tablet Take 1 tablet (2,000 Units total) by mouth daily.  . Coenzyme Q-10 200 MG CAPS Take 200 mg by mouth daily.   . Cyanocobalamin (B-12) 5000 MCG CAPS Take 5,000 mcg by mouth daily.  Marland Kitchen EPINEPHrine (EPIPEN) 0.3 mg/0.3 mL DEVI Inject 0.3 mg into the muscle once as needed (anaphylaxis).   Marland Kitchen esomeprazole (NEXIUM) 40 MG capsule Take 40 mg by mouth every other day.   . ezetimibe (ZETIA) 10 MG tablet TAKE 1 TABLET BY MOUTH EVERYDAY AT BEDTIME  . ferrous sulfate 325 (65 FE) MG EC tablet Take 1 tablet (325 mg total) by mouth daily with breakfast.  . fexofenadine (ALLEGRA) 180 MG tablet Take 180 mg by mouth as needed.   Marland Kitchen FLUoxetine (PROZAC) 10 MG tablet Take 20 mg by mouth in the morning and at bedtime.  . fluticasone (FLONASE) 50 MCG/ACT nasal spray USE 2 SPRAYS INTO THE NOSE EVERY 12 HOURS AS NEEDED FOR STUFFY NOSE  . furosemide (LASIX) 20 MG tablet Take 1 tablet (20 mg total) by mouth daily as needed for fluid.  Marland Kitchen glucose blood (ONETOUCH VERIO) test strip Use as instructed to check sugar 2-3x time daily  . GLUTATHIONE PO Take 5 drops by mouth in the morning and at bedtime.  . metFORMIN (GLUCOPHAGE) 500 MG tablet Take 1 tablet (500 mg total) by mouth daily with supper.  . Multiple Vitamins-Minerals (ZINC PO) Take by mouth.  . nitroGLYCERIN (NITROSTAT) 0.4 MG SL tablet Place 1 tablet (0.4 mg total) under the tongue every 5 (five) minutes as needed for chest pain.  Glory Rosebush DELICA LANCETS FINE MISC USE TO CHECK SUGAR 1 TIME DAILY  . ONETOUCH VERIO test strip USE AS INSTRUCTED TO CHECK SUGAR 1 TIME DAILY  .  rosuvastatin (CRESTOR) 10 MG tablet TAKE 1 TABLET (10 MG TOTAL) BY MOUTH EVERY OTHER DAY.  Marland Kitchen Spacer/Aero Chamber [employer] Use with  inhaler as needed.  J44.9  . SYNTHROID 150 MCG tablet Take 1 tablet (150 mcg total) by mouth daily before breakfast.  . valsartan (DIOVAN) 160 MG tablet TAKE ONE HALF TABLET (80 MG TOTAL) 2 TIMES DAILY.  Marland Kitchen VITAMIN A PO Take 600 mcg by mouth.  Marland Kitchen VITAMIN D, CHOLECALCIFEROL, PO Take 15 mg by mouth.  . potassium chloride (K-DUR) 10 MEQ tablet Take 1 tablet (10 mEq total) by mouth daily as needed (Takes with furosemide doses.).   No facility-administered encounter medications on file as of 01/30/2020.     Review of Systems  Review of Systems  Constitutional: Negative for activity change, fatigue and fever.  HENT: Positive for congestion (productive cough with clear mucous ). Negative for sinus pressure, sinus pain and sore throat.   Respiratory: Positive for cough and shortness of breath. Negative for  wheezing.   Cardiovascular: Negative for chest pain and palpitations.  Gastrointestinal: Negative for diarrhea, nausea and vomiting.  Musculoskeletal: Negative for arthralgias.  Neurological: Negative for dizziness.  Psychiatric/Behavioral: Positive for dysphoric mood. Negative for sleep disturbance. The patient is nervous/anxious.      Physical Exam  BP 132/72 (BP Location: Left Arm, Cuff Size: Normal)   Pulse 88   Temp (!) 97.2 F (36.2 C) (Oral)   Ht 5\' 2"  (1.575 m)   Wt 220 lb (99.8 kg)   LMP  (LMP Unknown)   SpO2 93%   BMI 40.24 kg/m   Wt Readings from Last 5 Encounters:  01/30/20 220 lb (99.8 kg)  01/25/20 220 lb 6 oz (100 kg)  11/27/19 226 lb 8 oz (102.7 kg)  11/21/19 225 lb (102.1 kg)  10/31/19 229 lb (103.9 kg)    BMI Readings from Last 5 Encounters:  01/30/20 40.24 kg/m  01/25/20 36.67 kg/m  11/27/19 37.69 kg/m  11/21/19 37.44 kg/m  10/31/19 38.11 kg/m     Physical Exam Vitals and nursing note reviewed.   Constitutional:      General: She is not in acute distress.    Appearance: Normal appearance. She is obese.  HENT:     Head: Normocephalic and atraumatic.     Right Ear: External ear normal.     Left Ear: External ear normal.     Mouth/Throat:     Mouth: Mucous membranes are moist.  Eyes:     Pupils: Pupils are equal, round, and reactive to light.  Cardiovascular:     Rate and Rhythm: Normal rate and regular rhythm.     Pulses: Normal pulses.     Heart sounds: Normal heart sounds. No murmur heard.   Pulmonary:     Effort: Pulmonary effort is normal. No respiratory distress.     Breath sounds: No decreased air movement. No decreased breath sounds, wheezing or rales.  Musculoskeletal:     Cervical back: Normal range of motion.     Right lower leg: No edema.     Left lower leg: No edema.  Skin:    General: Skin is warm and dry.     Capillary Refill: Capillary refill takes less than 2 seconds.  Neurological:     General: No focal deficit present.     Mental Status: She is alert and oriented to person, place, and time. Mental status is at baseline.     Gait: Gait abnormal (Walks with walker).  Psychiatric:        Mood and Affect: Mood is anxious and depressed. Affect is flat.        Behavior: Behavior normal.        Thought Content: Thought content normal.        Judgment: Judgment normal.       Assessment & Plan:   Chronic diastolic CHF (congestive heart failure) Plan: Continue follow-up with cardiology Continue to use Lasix as needed Continue to weigh yourself regularly Discussed with cardiology at next office visit if you should be taking your Lasix more often especially if you are noticing that your weight is trending up over a short timeframe such as over 24 hours or over a week timeframe  Chronic respiratory failure with hypoxia ([place]) Plan: Walk today in office, patient qualified for 3 L of O2 with physical exertion Continue 2 L of O2 at rest and at  nighttime We will send order to DME company for patient to be on to safely increase her  O2 to 3 L with physical exertion   COPD GOLD II Plan: Continue Symbicort 160 Continue rescue inhaler as needed Follow-up with our office in 2 months  Malignant neoplasm of right upper lobe of lung ([place]) Plan: Continue follow-up with radiation oncology Repeat CT chest in March/2022 as outlined by radiation oncology  Obstructive sleep apnea Known obstructive sleep apnea Patient intolerant of CPAP therapy  Plan: continue O2 at night  Rhinitis Plan: Continue Allegra Continue Flonase  Hypothyroidism Plan: Continue medications as outlined by primary care Continue levothyroxine  Anxiety I believe this may be the largest component of patient's symptoms.  Patient currently established with psychiatry.  They are working on titrating her medications up for better management of her depression as well as anxiety.  I explained the patient today that worsened anxiety can affect dyspnea.  Plan: Continue follow-up with psychiatry Continue medications as outlined by them  Depression, recurrent ([place]) Plan: Continue follow-up with psychiatry  Healthcare maintenance Up-to-date with flu vaccine Up-to-date with COVID-19 vaccinations Referral to pulmonary rehab Work on increasing overall physical activity  Iron deficiency anemia History of iron deficiency anemia  Plan: Continue follow-up with primary care Continue iron  Lower extremity edema Plan: Continue follow-up with cardiology Continue Lasix as needed  Physical deconditioning Sedentary lifestyle BMI 40.2 Ongoing dyspnea on exertion Physical deconditioning is a component of this  Plan: Work on increasing overall physical mobility Referral to pulmonary rehab  SOB (shortness of breath) Shortness of breath is multifactorial: Status post SBRT, known COPD, Physical deconditioning, iron deficiency anemia, hypothyroidism, anxiety,  depression, former smoker.  BMI 40.2  Plan: Walk today in office patient qualified for 3 L of O2 with physical exertion Continue oxygen therapy Continue Symbicort 160 Referral to pulmonary rehab Keep follow-up with cardiology, primary care, psychiatry      Return in about 3 months (around 05/01/2020), or if symptoms worsen or fail to improve, for Follow up with Dr. Halford Chessman.   Lauraine Rinne, NP 01/30/2020   This appointment required 55 minutes of patient care (this includes precharting, chart review, review of results, face-to-face care, etc.).

## 2020-01-30 NOTE — Assessment & Plan Note (Signed)
Plan: Continue Symbicort 160 Continue rescue inhaler as needed Follow-up with our office in 2 months

## 2020-01-30 NOTE — Progress Notes (Signed)
Reviewed and agree with assessment/plan.   Chesley Mires, MD [facility] Pulmonary/Critical Care 01/30/2020, 7:28 PM Pager:  [phone]

## 2020-01-30 NOTE — Assessment & Plan Note (Signed)
Plan: Continue follow-up with psychiatry

## 2020-01-30 NOTE — Assessment & Plan Note (Signed)
Plan: Continue Allegra Continue Flonase

## 2020-01-30 NOTE — Assessment & Plan Note (Signed)
Plan: Continue follow-up with cardiology Continue to use Lasix as needed Continue to weigh yourself regularly Discussed with cardiology at next office visit if you should be taking your Lasix more often especially if you are noticing that your weight is trending up over a short timeframe such as over 24 hours or over a week timeframe

## 2020-01-30 NOTE — Assessment & Plan Note (Signed)
Up-to-date with flu vaccine Up-to-date with COVID-19 vaccinations Referral to pulmonary rehab Work on increasing overall physical activity

## 2020-02-04 ENCOUNTER — Other Ambulatory Visit: Payer: Medicare Other

## 2020-02-05 ENCOUNTER — Ambulatory Visit (INDEPENDENT_AMBULATORY_CARE_PROVIDER_SITE_OTHER): Payer: Medicare Other | Admitting: Family Medicine

## 2020-02-05 ENCOUNTER — Other Ambulatory Visit: Payer: Self-pay

## 2020-02-05 ENCOUNTER — Encounter: Payer: Self-pay | Admitting: Family Medicine

## 2020-02-05 VITALS — BP 138/80 | HR 80 | Temp 97.8°F | Ht 65.0 in | Wt 219.3 lb

## 2020-02-05 DIAGNOSIS — E039 Hypothyroidism, unspecified: Secondary | ICD-10-CM

## 2020-02-05 DIAGNOSIS — M810 Age-related osteoporosis without current pathological fracture: Secondary | ICD-10-CM

## 2020-02-05 DIAGNOSIS — Z0000 Encounter for general adult medical examination without abnormal findings: Secondary | ICD-10-CM

## 2020-02-05 DIAGNOSIS — F419 Anxiety disorder, unspecified: Secondary | ICD-10-CM

## 2020-02-05 DIAGNOSIS — D649 Anemia, unspecified: Secondary | ICD-10-CM

## 2020-02-05 DIAGNOSIS — Z7189 Other specified counseling: Secondary | ICD-10-CM

## 2020-02-05 NOTE — Patient Instructions (Addendum)
Please verify your home dose of iron and let me know.  We'll need to make plans at that point.  Don't change your other meds for now.  Dr. Cruzita Lederer will recheck your thyroid tests.  Take care.  Glad to see you.

## 2020-02-05 NOTE — Progress Notes (Signed)
This visit occurred during the SARS-CoV-2 public health emergency.  Safety protocols were in place, including screening questions prior to the visit, additional usage of staff PPE, and extensive cleaning of exam room while observing appropriate contact time as indicated for disinfecting solutions.  I have personally reviewed the Medicare Annual Wellness questionnaire and have noted 1. The patient's medical and social history 2. Their use of alcohol, tobacco or illicit drugs 3. Their current medications and supplements 4. The patient's functional ability including ADL's, fall risks, home safety risks and hearing or visual             impairment. 5. Diet and physical activities 6. Evidence for depression or mood disorders  The patients weight, height, BMI have been recorded in the chart and visual acuity is per eye clinic.  I have made referrals, counseling and provided education to the patient based review of the above and I have provided the pt with a written personalized care plan for preventive services.  Provider list updated- see scanned forms.  Routine anticipatory guidance given to patient.  See health maintenance. The possibility exists that previously documented standard health maintenance information may have been brought forward from a previous encounter into this note.  If needed, that same information has been updated to reflect the current situation based on today's encounter.    Flu 2021 Shingles discussed with patient PNA up-to-date Tetanus 2018 COVID vaccine 2021 Colon cancer screening not applicable given her age. Breast cancer screening pending, she will call regarding follow-up. Bone density test ordered 2021 Advance directive-husband and daughter Adah Salvage equally designated if patient were incapacitated. Cognitive function addressed- see scanned forms- and if abnormal then additional documentation follows.   Anemia noted.  She was unclear about OTC iron use/dosage.   She may be on lower dose.  No known blood in stool.  I asked her to check on her home iron dose and let me know.  I will await update from patient.  Discussed with patient about previous thyroid testing.  Per endocrinology, "due to previous variability in her TFTs, what I would do for now is just repeat the tests in 4 to 5 weeks. At that time, if TSH is still suppressed, we need to back off the dose. I put these labs in and we will let her know to come to the clinic to have this repeated."  I will defer to endocrinology  Mood discussed with patient.  She has follow-up with counseling pending.    PMH and SH reviewed  Meds, vitals, and allergies reviewed.   ROS: Per HPI.  Unless specifically indicated otherwise in HPI, the patient denies:  General: fever. Eyes: acute vision changes ENT: sore throat Cardiovascular: chest pain Respiratory: SOB GI: vomiting GU: dysuria Musculoskeletal: acute back pain Derm: acute rash Neuro: acute motor dysfunction Psych: worsening mood Endocrine: polydipsia Heme: bleeding Allergy: hayfever  GEN: nad, alert and oriented HEENT: NCAT, on O2 via nasal cannula at baseline NECK: supple w/o LA CV: rrr. PULM: ctab, no inc wob ABD: soft, +bs EXT: no edema SKIN: Well-perfused.

## 2020-02-06 ENCOUNTER — Telehealth: Payer: Self-pay | Admitting: Family Medicine

## 2020-02-06 NOTE — Assessment & Plan Note (Signed)
Advance directive-husband and daughter Adah Salvage equally designated if patient were incapacitated.

## 2020-02-06 NOTE — Telephone Encounter (Signed)
Please route my office visit note to [name] at [place] [facility] counseling.  My understanding is that the patient has follow-up with counseling in the near future but I wanted to make sure the patient also had follow-up with Constance Haw.  Thanks.

## 2020-02-06 NOTE — Assessment & Plan Note (Signed)
Discussed with patient about previous thyroid testing.  Per endocrinology, "due to previous variability in her TFTs, what I would do for now is just repeat the tests in 4 to 5 weeks. At that time, if TSH is still suppressed, we need to back off the dose. I put these labs in and we will let her know to come to the clinic to have this repeated."  I will defer to endocrinology

## 2020-02-06 NOTE — Assessment & Plan Note (Signed)
Mood discussed with patient.  She has follow-up with counseling pending.   See following phone note.

## 2020-02-06 NOTE — Assessment & Plan Note (Signed)
She was unclear about OTC iron use/dosage.  She may be on lower dose.  No known blood in stool.  I asked her to check on her home iron dose and let me know.  I will await update from patient.  She may only need increased iron replacement but I will await update from her.

## 2020-02-06 NOTE — Assessment & Plan Note (Signed)
Flu 2021 Shingles discussed with patient PNA up-to-date Tetanus 2018 COVID vaccine 2021 Colon cancer screening not applicable given her age. Breast cancer screening pending, she will call regarding follow-up. Bone density test ordered 2021 Advance directive-husband and daughter Adah Salvage equally designated if patient were incapacitated. Cognitive function addressed- see scanned forms- and if abnormal then additional documentation follows.

## 2020-02-07 NOTE — Telephone Encounter (Signed)
Faxed paperwork sent

## 2020-02-12 DIAGNOSIS — F4323 Adjustment disorder with mixed anxiety and depressed mood: Secondary | ICD-10-CM | POA: Diagnosis not present

## 2020-02-17 ENCOUNTER — Telehealth: Payer: Self-pay | Admitting: Family Medicine

## 2020-02-17 DIAGNOSIS — D649 Anemia, unspecified: Secondary | ICD-10-CM

## 2020-02-17 NOTE — Telephone Encounter (Signed)
Please verify her current iron dose.  We talked about it at the office visit but I do not see clarification from the patient after that.  Thanks.

## 2020-02-18 NOTE — Telephone Encounter (Signed)
Spoke with pt and she stated that she is taking ferrous sulfate 25mg 

## 2020-02-19 MED ORDER — IRON (FERROUS SULFATE) 325 (65 FE) MG PO TABS: 325.0000 mg | ORAL_TABLET | Freq: Every day | ORAL | 3 refills | Status: DC

## 2020-02-19 NOTE — Telephone Encounter (Signed)
She needs to change to ferrous sulfate 325mg .  Rx sent.  Will make her stools dark.  Update me if not tolerated.  Needs recheck labs here in 1 month.  Orders for iron and cbc are in emr.  Thanks.

## 2020-02-19 NOTE — Telephone Encounter (Signed)
Message sent thru MyChart 

## 2020-02-19 NOTE — Addendum Note (Signed)
Addended by: Tonia Ghent on: 02/19/2020 08:02 AM   Modules accepted: Orders

## 2020-02-28 ENCOUNTER — Telehealth (INDEPENDENT_AMBULATORY_CARE_PROVIDER_SITE_OTHER): Payer: Medicare Other | Admitting: Family Medicine

## 2020-02-28 ENCOUNTER — Encounter: Payer: Self-pay | Admitting: Family Medicine

## 2020-02-28 ENCOUNTER — Other Ambulatory Visit: Payer: Self-pay

## 2020-02-28 VITALS — HR 72 | Wt 217.3 lb

## 2020-02-28 DIAGNOSIS — N3000 Acute cystitis without hematuria: Secondary | ICD-10-CM

## 2020-02-28 LAB — POC URINALSYSI DIPSTICK (AUTOMATED)
Bilirubin, UA: 1
Blood, UA: 50
Glucose, UA: NEGATIVE
Nitrite, UA: NEGATIVE
Protein, UA: POSITIVE — AB
Spec Grav, UA: >=1.03 — AB (ref 1.010–1.025)
Urobilinogen, UA: 0.2 E.U./dL
pH, UA: 6 (ref 5.0–8.0)

## 2020-02-28 MED ORDER — CEPHALEXIN 250 MG PO CAPS: 250.0000 mg | ORAL_CAPSULE | Freq: Two times a day (BID) | ORAL | 0 refills | Status: DC

## 2020-02-28 NOTE — Progress Notes (Signed)
Virtual Visit via Telephone Note  I connected with Alexandria [name] on 02/28/20 at 11:00 AM EST by telephone and verified that I am speaking with the correct person using two identifiers.  Location: Patient: home Provider: offie     I discussed the limitations, risks, security and privacy concerns of performing an evaluation and management service by telephone and the availability of in person appointments. I also discussed with the patient that there may be a patient responsible charge related to this service. The patient expressed understanding and agreed to proceed.  Parties involved in encounter  [place]   Provider:  Loura Pardon MD    History of Present Illness: 84 yo pt of [name] presents with urinary symptoms   Symptoms started 3 days ago   Warm burning sensation  Some low back pain  No blood in urine  Some frequency , and urgency  No incontinence   No fever or nausea  Unsure if some bladder pressure   Drinking lots of water  Gets about 1 uti a year  No kidney infections   Patient Active Problem List   Diagnosis Date Noted  . Physical deconditioning 01/30/2020  . Healthcare maintenance 01/30/2020  . PAD (peripheral artery disease) ([place]) 01/27/2020  . Sore throat 11/21/2019  . GERD (gastroesophageal reflux disease) 11/21/2019  . CAD (coronary artery disease) 07/24/2019  . Malignant neoplasm of right upper lobe of lung ([place]) 07/24/2019  . Vitamin D deficiency 06/29/2018  . Hyperkalemia 06/28/2018  . Dysuria 01/01/2018  . Abnormal findings on diagnostic imaging of lung 12/18/2017  . Rhinitis 12/18/2017  . Knee laceration, right, subsequent encounter 10/06/2016  . Knee pain, acute 10/06/2016  . Chronic pain of left knee 09/15/2016  . Right foot pain 09/15/2016  . Chronic respiratory failure with hypoxia ([place]) 07/14/2016  . Diverticulosis of colon without hemorrhage   . Hemorrhoids   . Hiatal hernia   . Type 2 diabetes mellitus with diabetic  neuropathy, without long-term current use of insulin ([place]) 04/04/2015  . Left temporal headache 03/14/2015  . Chest tightness 12/04/2014  . Chronic diastolic CHF (congestive heart failure) ([place])   . Iron deficiency anemia   . Obesity hypoventilation syndrome ([place])   . Acute cystitis   . Absolute anemia   . Medicare annual wellness visit, subsequent 06/14/2014  . Advance care planning 06/14/2014  . RLQ abdominal pain 05/10/2014  . COPD exacerbation ([place]) 04/28/2014  . Lower extremity edema 09/15/2012  . SOB (shortness of breath) 08/24/2012  . Shoulder pain 12/17/2011  . Abdominal hernia 12/17/2011  . Anxiety 09/23/2011  . Osteopenia 07/25/2011  . Obstructive sleep apnea 02/09/2010  . MURMUR 02/04/2010  . CHEST PAIN 01/13/2010  . ONYCHOMYCOSIS, TOENAILS 06/30/2009  . PALPITATIONS 08/29/2008  . ESOPHAGEAL STENOSIS 11/22/2007  . DYSPHAGIA 11/08/2007  . ESOPHAGITIS 10/11/2007  . COPD GOLD II 05/23/2007  . ALLERGY 07/25/2006  . NUMBNESS 10/06/2001  . Hyperlipemia 10/04/2000  . Depression, recurrent ([place]) 03/08/1997  . Essential hypertension 03/08/1992  . Obesity, Class III, BMI 40-49.9 (morbid obesity) ([place]) 03/08/1989  . ROTATOR CUFF SYNDROME, RIGHT 03/08/1982  . Hypothyroidism 03/08/1968   Past Medical History:  Diagnosis Date  . Allergy, unspecified not elsewhere classified   . Anxiety   . Cataract   . Chronic diastolic congestive heart failure ([place])   . COPD (chronic obstructive pulmonary disease) ([place]) 03/08/1998   PFTs 12/12/2002 FEV 1 1.42 (64%) ratio 58 with no better after B2 and DLCO75%; PFTs 11/20/09 FEV1 1.50 (73%) ratio  50 no better after B2 with DLCO 62%; Hfa 50% 11/20/2009 >75%, 01/13/10 p coaching  . Depression 12/07/1998  . Diabetes mellitus type II 02/05/2006   Dr. Cruzita Lederer with endo  . Disorders of bursae and tendons in shoulder region, unspecified    Rotator cuff syndrome, right  . E. coli bacteremia   . Esophagitis   . GERD (gastroesophageal reflux  disease)   . History of UTI   . Hyperlipidemia 10/04/2000  . Hypertension 03/08/1992  . Hypothyroidism 03/08/1968  . Iron deficiency anemia   . Lung nodule    radiation starts 10-08-2019  . Malignant neoplasm of right upper lobe of lung ([place]) 07/24/2019  . Obesity    NOS  . OSA (obstructive sleep apnea)    PSG 01/27/10 AHI 13, pt does not know CPAP settings  . Peripheral neuropathy    Likely due to DM per Dr. Erling Cruz  . Ventral hernia    Past Surgical History:  Procedure Laterality Date  . ABD U/S  03/19/1999   Nml x2 foci in liver  . ADENOSINE MYOVIEW  06/02/2007   Nml  . CARDIOLITE PERSANTINE  08/24/2000   Nml  . CAROTID U/S  08/24/2000   1-39% ICA stenosis  . CAROTID U/S  06/02/2007   No apprec change   . CARPAL TUNNEL RELEASE  12/1997   Right  . CESAREAN SECTION     x2 Breech/ repeat  . CHOLECYSTECTOMY  1997  . COLONOSCOPY WITH PROPOFOL N/A 02/12/2016   Procedure: COLONOSCOPY WITH PROPOFOL;  Surgeon: Milus Banister, MD;  Location: [facility];  Service: Endoscopy;  Laterality: N/A;  . CT ABD W & PELVIS WO/W CM     Abd hemangiomas of liver, 1 cm R renal cyst  . DENTAL SURGERY  2016   Implants  . DEXA  07/03/2003   Nml  . ESOPHAGOGASTRODUODENOSCOPY  12/05/1997   Nml (due to hoarseness)  . ESOPHAGOGASTRODUODENOSCOPY (EGD) WITH PROPOFOL N/A 02/12/2016   Procedure: ESOPHAGOGASTRODUODENOSCOPY (EGD) WITH PROPOFOL;  Surgeon: Milus Banister, MD;  Location: [facility];  Service: Endoscopy;  Laterality: N/A;  . GALLBLADDER SURGERY    . HERNIA REPAIR  01/24/2009   Lap Ventr w/ Lysis of adhesions (Dr. Donne Hazel)  . knee arthroscopic surgery  years ago   right  . ROTATOR CUFF REPAIR  1984   Right, [place]  . SHOULDER OPEN ROTATOR CUFF REPAIR  02/08/2012   Procedure: ROTATOR CUFF REPAIR SHOULDER OPEN;  Surgeon: Magnus Sinning, MD;  Location: [facility];  Service: Orthopedics;  Laterality: Left;  Left Shoulder Open Anterior Acrominectomy Rotator Cuff Repair Open Distal  Clavicle Resection ,tissue mend graft, and repair of biceps tendon  . THUMB RELEASE  12/1997   Right  . TONSILLECTOMY    . TOTAL ABDOMINAL HYSTERECTOMY  1985   Due to dysmennorhea  . US ECHOCARDIOGRAPHY  06/02/2007   Social History   Tobacco Use  . Smoking status: Former Smoker    Packs/day: 2.00    Years: 50.00    Pack years: 100.00    Types: Cigarettes    Quit date: 02/05/2005    Years since quitting: 15.0  . Smokeless tobacco: Never Used  Vaping Use  . Vaping Use: Never used  Substance Use Topics  . Alcohol use: Yes    Comment: occasionally  . Drug use: Never   Family History  Problem Relation Age of Onset  . Heart disease Mother   . Thyroid disease Mother   . Emphysema Father  One lung  . Cystic fibrosis Sister   . Hyperthyroidism Sister   . Osteoarthritis Brother   . Hyperthyroidism Brother   . Esophageal cancer Neg Hx   . Cancer Neg Hx        Head or neck  . Colon cancer Neg Hx   . Stomach cancer Neg Hx   . Breast cancer Neg Hx    Allergies  Allergen Reactions  . Antihistamines, Diphenhydramine-Type Other (See Comments)    Able to tolerate only allegra.   . Sulfonamide Derivatives Swelling    REACTION: closed throat  . Incruse Ellipta [Umeclidinium Bromide] Cough    Caused voice change and severe coughing  . Clarithromycin Other (See Comments)    REACTION: diff swallowing and mouth blisters  . Codeine Other (See Comments)    Unknown   . Lyrica [Pregabalin] Other (See Comments)    Lack of effect for neuropathy pain.    Marland Kitchen Spiriva Handihaler [Tiotropium Bromide Monohydrate] Other (See Comments)    Voice changes   Current Outpatient Medications on File Prior to Visit  Medication Sig Dispense Refill  . albuterol (PROAIR HFA) 108 (90 Base) MCG/ACT inhaler Inhale 2 puffs into the lungs every 6 (six) hours as needed for wheezing or shortness of breath. INHALE 2 PUFFS INTO THE LUNGS EVERY 6 HOURS AS NEEDED FOR WHEEZING OR SHORTNESS OF BREATH 3 Inhaler 3   . budesonide-formoterol (SYMBICORT) 160-4.5 MCG/ACT inhaler Inhale 2 puffs into the lungs 2 (two) times daily. 3 Inhaler 3  . busPIRone (BUSPAR) 5 MG tablet 15 mg 3 (three) times daily. As needed    . cariprazine (VRAYLAR) capsule Take 1.5 mg by mouth daily.    . Cholecalciferol (VITAMIN D) 50 MCG (2000 UT) tablet Take 1 tablet (2,000 Units total) by mouth daily.    . Coenzyme Q-10 200 MG CAPS Take 200 mg by mouth daily.    . Cyanocobalamin (B-12) 5000 MCG CAPS Take 5,000 mcg by mouth daily.    Marland Kitchen EPINEPHrine (EPI-PEN) 0.3 mg/0.3 mL DEVI Inject 0.3 mg into the muscle once as needed (anaphylaxis).    Marland Kitchen esomeprazole (NEXIUM) 40 MG capsule Take 40 mg by mouth every other day.     . ezetimibe (ZETIA) 10 MG tablet TAKE 1 TABLET BY MOUTH EVERYDAY AT BEDTIME 90 tablet 1  . fexofenadine (ALLEGRA) 180 MG tablet Take 180 mg by mouth as needed.     Marland Kitchen FLUoxetine (PROZAC) 10 MG tablet Take 20 mg by mouth in the morning and at bedtime.    . fluticasone (FLONASE) 50 MCG/ACT nasal spray USE 2 SPRAYS INTO THE NOSE EVERY 12 HOURS AS NEEDED FOR STUFFY NOSE 48 g 2  . furosemide (LASIX) 20 MG tablet Take 1 tablet (20 mg total) by mouth daily as needed for fluid. 90 tablet 1  . glucose blood (ONETOUCH VERIO) test strip Use as instructed to check sugar 2-3x time daily 200 each 5  . GLUTATHIONE PO Take 5 drops by mouth in the morning and at bedtime.    . Iron, Ferrous Sulfate, 325 (65 Fe) MG TABS Take 325 mg by mouth daily. 90 tablet 3  . metFORMIN (GLUCOPHAGE) 500 MG tablet Take 1 tablet (500 mg total) by mouth daily with supper. 90 tablet 3  . Multiple Vitamins-Minerals (ZINC PO) Take by mouth.    . nitroGLYCERIN (NITROSTAT) 0.4 MG SL tablet Place 1 tablet (0.4 mg total) under the tongue every 5 (five) minutes as needed for chest pain. 25 tablet 3  . ONETOUCH  DELICA LANCETS FINE MISC USE TO CHECK SUGAR 1 TIME DAILY 100 each 5  . ONETOUCH VERIO test strip USE AS INSTRUCTED TO CHECK SUGAR 1 TIME DAILY 100 strip 8  .  rosuvastatin (CRESTOR) 10 MG tablet TAKE 1 TABLET (10 MG TOTAL) BY MOUTH EVERY OTHER DAY. 45 tablet 2  . Spacer/Aero Chamber [employer] Use with  inhaler as needed.  J44.9 1 each 0  . SYNTHROID 150 MCG tablet Take 1 tablet (150 mcg total) by mouth daily before breakfast. 45 tablet 5  . valsartan (DIOVAN) 160 MG tablet TAKE ONE HALF TABLET (80 MG TOTAL) 2 TIMES DAILY. 90 tablet 1  . VITAMIN A PO Take 600 mcg by mouth.    Marland Kitchen VITAMIN D, CHOLECALCIFEROL, PO Take 15 mg by mouth.    . potassium chloride (K-DUR) 10 MEQ tablet Take 1 tablet (10 mEq total) by mouth daily as needed (Takes with furosemide doses.). 90 tablet 1   No current facility-administered medications on file prior to visit.   Review of Systems  Constitutional: Negative for chills, fever and malaise/fatigue.  HENT: Negative for congestion, ear pain, sinus pain and sore throat.   Eyes: Negative for blurred vision, discharge and redness.  Respiratory: Negative for cough, shortness of breath and stridor.   Cardiovascular: Negative for chest pain, palpitations and leg swelling.  Gastrointestinal: Negative for abdominal pain, diarrhea, nausea and vomiting.  Genitourinary: Positive for dysuria and frequency. Negative for flank pain and hematuria.  Musculoskeletal: Positive for back pain. Negative for myalgias.  Skin: Negative for rash.  Neurological: Negative for dizziness and headaches.      Observations/Objective: Pt sounds well  Not distressed Good historian  Cheerful/nl affect   Assessment and Plan: Problem List Items Addressed This Visit      Genitourinary   Acute cystitis - Primary    Uncomplicated Coming to leave urine sample and will also send for cx tx with keflex Also good fluid intake inst to call if symptoms suddenly worsen or change  Meds ordered this encounter  Medications  . cephALEXin (KEFLEX) 250 MG capsule    Sig: Take 1 capsule (250 mg total) by mouth 2 (two) times daily.    Dispense:  14 capsule     Refill:  0         Relevant Orders   POCT Urinalysis Dipstick (Automated)   Urine Culture       Follow Up Instructions: Drink lots of fluids  Come over and give a urine sample Then pick up the keflex from your pharmacy and start taking it  If symptoms change or worsen let me know  We will contact you with a culture result when that returns    I discussed the assessment and treatment plan with the patient. The patient was provided an opportunity to ask questions and all were answered. The patient agreed with the plan and demonstrated an understanding of the instructions.   The patient was advised to call back or seek an in-person evaluation if the symptoms worsen or if the condition fails to improve as anticipated.  I provided 13 minutes of non-face-to-face time during this encounter.   Loura Pardon, MD

## 2020-02-28 NOTE — Assessment & Plan Note (Signed)
Uncomplicated Coming to leave urine sample and will also send for cx tx with keflex Also good fluid intake inst to call if symptoms suddenly worsen or change  Meds ordered this encounter  Medications  . cephALEXin (KEFLEX) 250 MG capsule    Sig: Take 1 capsule (250 mg total) by mouth 2 (two) times daily.    Dispense:  14 capsule    Refill:  0

## 2020-02-28 NOTE — Patient Instructions (Signed)
Drink lots of fluids  Come over and give a urine sample Then pick up the keflex from your pharmacy and start taking it  If symptoms change or worsen let me know  We will contact you with a culture result when that returns

## 2020-03-01 LAB — URINE CULTURE
MICRO NUMBER:: 11353285
SPECIMEN QUALITY:: ADEQUATE

## 2020-03-06 ENCOUNTER — Telehealth: Payer: Self-pay | Admitting: Family Medicine

## 2020-03-06 DIAGNOSIS — Z1231 Encounter for screening mammogram for malignant neoplasm of breast: Secondary | ICD-10-CM | POA: Diagnosis not present

## 2020-03-06 LAB — HM MAMMOGRAPHY

## 2020-03-06 NOTE — Telephone Encounter (Signed)
I am unavailable to see patient today.  I am not in clinic due to unexpected event with mult family members who are ill.    Please triage patient and give her routine ER UC cautions as she is likely going to need eval.  Thanks.

## 2020-03-06 NOTE — Telephone Encounter (Signed)
While on hold for access nurse she disconnected the call. EM

## 2020-03-06 NOTE — Telephone Encounter (Signed)
Patient called back explained that she will get a call from the triage nurse. She said well I have a video visit with Dr Glori Bickers do you think I can get another round of antibiotics for my leg? EM

## 2020-03-06 NOTE — Telephone Encounter (Signed)
Patient called in at 4:00pm requesting to be seeing at our office. Explained to the patient that we had no openings available. She states not even for an emergency visit. I explained that we had no one that could see her . She said even if I wait in the waiting room. I said we don't have anyone. She states that she went somewhere and nurse or cma noticed a spot on her leg and her being a diabetic she needed to be seen cause of how bad it looked. She states she went to an urgent care and it was a 5 hr wait to be seen. I told her that we had no one that could look at it here. I told her that I would connect her to a triage nurse (access nurse) EM

## 2020-03-07 ENCOUNTER — Other Ambulatory Visit: Payer: Self-pay | Admitting: Family Medicine

## 2020-03-07 ENCOUNTER — Other Ambulatory Visit: Payer: Self-pay | Admitting: Pulmonary Disease

## 2020-03-10 ENCOUNTER — Other Ambulatory Visit
Admission: RE | Admit: 2020-03-10 | Discharge: 2020-03-10 | Disposition: A | Discharge status: Home / Self Care | Payer: Medicare Other | Source: Ambulatory Visit | Attending: Physician Assistant | Admitting: Physician Assistant

## 2020-03-10 ENCOUNTER — Other Ambulatory Visit: Payer: Self-pay

## 2020-03-10 ENCOUNTER — Encounter: Payer: Medicare Other | Attending: Physician Assistant | Admitting: Physician Assistant

## 2020-03-10 DIAGNOSIS — I5042 Chronic combined systolic (congestive) and diastolic (congestive) heart failure: Secondary | ICD-10-CM | POA: Diagnosis not present

## 2020-03-10 DIAGNOSIS — I890 Lymphedema, not elsewhere classified: Secondary | ICD-10-CM | POA: Insufficient documentation

## 2020-03-10 DIAGNOSIS — J439 Emphysema, unspecified: Secondary | ICD-10-CM | POA: Diagnosis not present

## 2020-03-10 DIAGNOSIS — Z87891 Personal history of nicotine dependence: Secondary | ICD-10-CM | POA: Insufficient documentation

## 2020-03-10 DIAGNOSIS — I872 Venous insufficiency (chronic) (peripheral): Secondary | ICD-10-CM | POA: Diagnosis not present

## 2020-03-10 DIAGNOSIS — L97929 Non-pressure chronic ulcer of unspecified part of left lower leg with unspecified severity: Secondary | ICD-10-CM | POA: Diagnosis not present

## 2020-03-10 DIAGNOSIS — L97812 Non-pressure chronic ulcer of other part of right lower leg with fat layer exposed: Secondary | ICD-10-CM | POA: Diagnosis not present

## 2020-03-10 DIAGNOSIS — E11622 Type 2 diabetes mellitus with other skin ulcer: Secondary | ICD-10-CM | POA: Insufficient documentation

## 2020-03-10 NOTE — Telephone Encounter (Signed)
Noted. Thanks.

## 2020-03-10 NOTE — Telephone Encounter (Signed)
[facility] - Client TELEPHONE ADVICE RECORD Access[facility]urse Patient Name: Alexandria [name] Gender: Female DOB: [birth year] Age: 85 Y 48 M 2 D Return Phone Number: [phone] (Primary), [phone] (Secondary) [place] [address] Client [facility] - Client Client Site [facility] - Day Physician Renford Dills - MD Contact Type Call Who Is Calling Patient / Member / Family / Caregiver Call Type Triage / Clinical Relationship To Patient Self Return Phone Number [phone] (Primary) Chief Complaint Leg Swelling And Edema Reason for Call Symptomatic / Request for Health Information Initial Comment Caller was out somewhere and someone noticed her leg and it was leaking fluid out of her leg. She is a diabetic. Translation No Nurse Assessment Nurse: Mancel Bale, RN, Butch Penny Date/Time [name] Time): 03/06/2020 4:36:41 PM Confirm and document reason for call. If symptomatic, describe symptoms. ---Caller states she has a wound on her leg that occurred last month. She states now the entire calf of her leg is swollen, red, and weeping. She had a telehealth visit for a UTI within the last week and is finishing a course of ABX. Does the patient have any new or worsening symptoms? ---Yes Will a triage be completed? ---Yes Related visit to physician within the last 2 weeks? ---Yes Does the PT have any chronic conditions? (i.e. diabetes, asthma, this includes High risk factors for pregnancy, etc.) ---Yes List chronic conditions. ---Anemic, COPD, DM Is this a behavioral health or substance abuse call? ---No Guidelines Guideline Title Affirmed Question Affirmed Notes Nurse Date/Time [name] Time) Leg Swelling and Edema SEVERE difficulty breathing (e.g., struggling for each breath, speaks in single words) Sheran Fava 03/06/2020 4:39:13 PM Disp. Time [name] Time) Disposition Final  User 03/06/2020 4:49:13 PM 911 Outcome Documentation Mancel Bale, RN, Butch Penny Reason: Caller states she will call 911 right now 03/06/2020 4:42:37 PM Call EMS 911 Now Yes Mancel Bale, RN, Butch Penny PLEASE NOTE: All timestamps contained within this report are represented as [place] Standard Time. CONFIDENTIALTY NOTICE: This fax transmission is intended only for the addressee. It contains information that is legally privileged, confidential or otherwise protected from use or disclosure. If you are not the intended recipient, you are strictly prohibited from reviewing, disclosing, copying using or disseminating any of this information or taking any action in reliance on or regarding this information. If you have received this fax in error, please notify us immediately by telephone so that we can arrange for its return to [place]. Phone: [phone], Toll-Free: [phone], Fax: [phone] Page: 2 of 2 Call Id: [id number] Grandfather Disagree/Comply Comply Caller Understands Yes PreDisposition Go to Urgent Care/Walk-In Clinic Care Advice Given Per Guideline CALL EMS 911 NOW: * Immediate medical attention is needed. You need to hang up and call 911 (or an ambulance). * Triager Discretion: I'll call you back in a few minutes to be sure you were able to reach them. Comments User: Marijo Conception, RN Date/Time [name] Time): 03/06/2020 4:43:54 PM Caller states she has been experiencing very low blood pressures at night so she has increased her oral fluid intake. She does have a hx of HTN. Referrals GO TO FACILITY UNDECIDED

## 2020-03-10 NOTE — Telephone Encounter (Signed)
Called and spoke with patient regarding this issue. Patient stated that she has a wound on her right leg that occurred last month after dropping a jar of peanut butter on it. She stated that it is not scabbed over and that she has been putting a cream on it that was prescribed by the wound doctor (unable to give name of medication). Patient stated that her leg is still swollen and red. Denies fever. Instructed patient that she needs to be seen for this issue. Patient stated that she wanted to see the wound doctor and was going to call and try to set up an appointment. This RN instructed patient that if she developed new or worsening symptoms, that she needed to be seen at an UC or ED, especially with her having diabetes. Patient verbalized understanding and stated that if she couldn't get an appointment with wound clinic, then she would call back and make an appointment with Dr. [name].

## 2020-03-12 LAB — AEROBIC CULTURE W GRAM STAIN (SUPERFICIAL SPECIMEN): Gram Stain: NONE SEEN

## 2020-03-14 ENCOUNTER — Other Ambulatory Visit: Payer: Self-pay

## 2020-03-14 ENCOUNTER — Telehealth: Payer: Self-pay | Admitting: Pulmonary Disease

## 2020-03-14 DIAGNOSIS — I872 Venous insufficiency (chronic) (peripheral): Secondary | ICD-10-CM | POA: Diagnosis not present

## 2020-03-14 DIAGNOSIS — L97929 Non-pressure chronic ulcer of unspecified part of left lower leg with unspecified severity: Secondary | ICD-10-CM | POA: Diagnosis not present

## 2020-03-14 DIAGNOSIS — Z87891 Personal history of nicotine dependence: Secondary | ICD-10-CM | POA: Diagnosis not present

## 2020-03-14 DIAGNOSIS — I5042 Chronic combined systolic (congestive) and diastolic (congestive) heart failure: Secondary | ICD-10-CM | POA: Diagnosis not present

## 2020-03-14 DIAGNOSIS — J439 Emphysema, unspecified: Secondary | ICD-10-CM | POA: Diagnosis not present

## 2020-03-14 DIAGNOSIS — L97812 Non-pressure chronic ulcer of other part of right lower leg with fat layer exposed: Secondary | ICD-10-CM | POA: Diagnosis not present

## 2020-03-14 DIAGNOSIS — E11622 Type 2 diabetes mellitus with other skin ulcer: Secondary | ICD-10-CM | POA: Diagnosis not present

## 2020-03-14 DIAGNOSIS — I890 Lymphedema, not elsewhere classified: Secondary | ICD-10-CM | POA: Diagnosis not present

## 2020-03-14 NOTE — Progress Notes (Addendum)
[name], [name] ([id number]) Visit Report for 03/10/2020 Chief Complaint Document Details Patient Name: Alexandria [name], Alexandria [name] Date of Service: 03/10/2020 12:30 PM Medical Record Number: [id number] Patient Account Number: [id number] Date of Birth/Sex: October 22, 1935 (85 y.o. F) Treating RN: Cornell Barman Primary Care Provider: Elsie Stain Other Clinician: Referring Provider: Elsie Stain Treating Provider/Extender: Skipper Cliche in Treatment: 0 Information Obtained from: Patient Chief Complaint Right LE Ulcer Electronic Signature(s) Signed: 03/10/2020 1:02:37 PM By: Worthy Keeler PA-C Entered By: Worthy Keeler on 03/10/2020 13:02:36 Alexandria [name] ([id number]) -------------------------------------------------------------------------------- Debridement Details Patient Name: Alexandria [name] Date of Service: 03/10/2020 12:30 PM Medical Record Number: [id number] Patient Account Number: [id number] Date of Birth/Sex: [birth year] (84 y.o. F) Treating RN: Carlene Coria Primary Care Provider: Elsie Stain Other Clinician: Referring Provider: Elsie Stain Treating Provider/Extender: Jeri Cos Weeks in Treatment: 0 Debridement Performed for Wound #3 Right,Medial Lower Leg Assessment: Performed By: Physician Tommie Sams., PA-C Debridement Type: Debridement Severity of Tissue Pre Debridement: Fat layer exposed Level of Consciousness (Pre- Awake and Alert procedure): Pre-procedure Verification/Time Out Yes - 13:03 Taken: Start Time: 13:03 Total Area Debrided (L x W): 2 (cm) x 1.3 (cm) = 2.6 (cm) Tissue and other material Viable, Non-Viable, Slough, Subcutaneous, Skin: Dermis , Skin: Epidermis, Slough debrided: Level: Skin/Subcutaneous Tissue Debridement Description: Excisional Instrument: Curette Specimen: Tissue Culture Number of Specimens Taken: 1 Bleeding: Moderate Hemostasis Achieved: Pressure End Time: 13:06 Procedural Pain: 4 Post Procedural Pain: 2 Response to  Treatment: Procedure was tolerated well Level of Consciousness (Post- Awake and Alert procedure): Post Debridement Measurements of Total Wound Length: (cm) 2 Width: (cm) 1.3 Depth: (cm) 0.3 Volume: (cm) 0.613 Character of Wound/Ulcer Post Debridement: Improved Severity of Tissue Post Debridement: Fat layer exposed Post Procedure Diagnosis Same as Pre-procedure Electronic Signature(s) Signed: 03/10/2020 2:13:03 PM By: Worthy Keeler PA-C Signed: 03/14/2020 4:22:48 PM By: Carlene Coria RN Entered By: Carlene Coria on 03/10/2020 13:08:28 Alexandria [name] ([id number]) -------------------------------------------------------------------------------- HPI Details Patient Name: Alexandria [name] Date of Service: 03/10/2020 12:30 PM Medical Record Number: [id number] Patient Account Number: [id number] Date of Birth/Sex: [birth year] (84 y.o. F) Treating RN: Cornell Barman Primary Care Provider: Elsie Stain Other Clinician: Referring Provider: Elsie Stain Treating Provider/Extender: Skipper Cliche in Treatment: 0 History of Present Illness HPI Description: 85 year old diabetic patient who recently was seen in the ER for a knee laceration which was closed with staples and then Steri-Stripped. She then developed a cellulitis on her right lower extremity and had to be treated with Septra and was previously on Keflex. She was asked to use a leg immobilizer and limited range of motion to 45o at the knee. The patient is known to have a past medical history of depression, hypothyroidism, hypertension, diastolic CHF, hyperlipidemia, diabetes mellitus, peripheral neuropathy. she is being treated by her pulmonologist for COPD with emphysema with excarcebation and was given a course of prednisone recently. She also was asked to use a CPAP machine and other symptomatic treatment was given. Last hemoglobin A1c was 5.8% 2 months ago. she used to be a heavy smoker but has quit about 11 years ago. 11/04/2016 --  the patient had a lower extremity venous duplex reflux evaluation done on 11/03/2016 and this showed no evidence of acute bilateral lower extremity deep vein thrombosis. She had reflux in bilateral common femoral veins and there was reflux in the bilateral saphenofemoral junction. However they were unable to obtain reflux in the bilateral great saphenous veins of the small saphenous veins and it was  a difficult examination technically. [place] wanted some advice about wound management while they were to be 11/12/2016 -- she returns after a vacation but has a new injury to her right lateral calf which she lacerated on a blunt object 11/25/2016 -- the patient still has not gotten in touch with her cardiologist regarding her diuretics and she continues to have significant lymphedema right lower extremity was then left Readmission: 03/10/2020 upon evaluation today patient appears to be doing somewhat poorly in regard to her right lower extremity as a result of having dropped a 40 ounce jar of peanut butter on her leg about a month ago causing a traumatic injury. She does have significant swelling and even some very mild erythema she has been on Keflex for urinary tract infection and tells me that really the wound has improved significantly during the time she is been on the Keflex. Fortunately there is no signs of active infection at this time. No fevers, chills, nausea, vomiting, or diarrhea. The patient does have a history of chronic venous insufficiency, lymphedema, diabetes mellitus type 2, congestive heart failure, and COPD. Electronic Signature(s) Signed: 03/10/2020 1:55:59 PM By: Worthy Keeler PA-C Entered By: Worthy Keeler on 03/10/2020 13:55:58 Alexandria [name] ([id number]) -------------------------------------------------------------------------------- Physical Exam Details Patient Name: Alexandria [name] Date of Service: 03/10/2020 12:30  PM Medical Record Number: [id number] Patient Account Number: [id number] Date of Birth/Sex: 07-08-1935 (84 y.o. F) Treating RN: Cornell Barman Primary Care Provider: Elsie Stain Other Clinician: Referring Provider: Elsie Stain Treating Provider/Extender: Jeri Cos Weeks in Treatment: 0 Constitutional sitting or standing blood pressure is within target range for patient.. pulse regular and within target range for patient.Marland Kitchen respirations regular, non- labored and within target range for patient.Marland Kitchen temperature within target range for patient.. Obese and well-hydrated in no acute distress. Eyes conjunctiva clear no eyelid edema noted. pupils equal round and reactive to light and accommodation. Ears, Nose, Mouth, and Throat no gross abnormality of ear auricles or external auditory canals. normal hearing noted during conversation. mucus membranes moist. Respiratory normal breathing without difficulty. Cardiovascular 2+ pitting edema of the bilateral lower extremities. This is worse on the right compared to the left.. Musculoskeletal normal gait and posture. no significant deformity or arthritic changes, no loss or range of motion, no clubbing. Psychiatric this patient is able to make decisions and demonstrates good insight into disease process. Alert and Oriented x 3. pleasant and cooperative. Notes Upon inspection patient's wound bed actually showed signs of good granulation at this time there was some slough noted however that is can require sharp debridement and I did actually discussed with the patient going ahead and performing debridement today in order to clear away some of the necrotic tissue. She agreed with this. Post debridement the wound bed appears to be doing much better which is great news. There does not appear to be any signs of infection which is also good news. With that being said we will continue to monitor for any signs of worsening in regard to infection likely give her a  prescription for an antibiotic today as well. Electronic Signature(s) Signed: 03/10/2020 1:56:52 PM By: Worthy Keeler PA-C Entered By: Worthy Keeler on 03/10/2020 13:56:51 Alexandria [name] ([id number]) -------------------------------------------------------------------------------- Physician Orders Details Patient Name: Alexandria [name] Date of Service: 03/10/2020 12:30 PM Medical Record Number: [id number] Patient Account Number: [id number] Date of Birth/Sex: 16-Jun-1935 (84 y.o. F) Treating RN: Carlene Coria Primary Care Provider:  Elsie Stain Other Clinician: Referring Provider: Elsie Stain Treating Provider/Extender: Skipper Cliche in Treatment: 0 Verbal / Phone Orders: No Diagnosis Coding ICD-10 Coding Code Description I87.2 Venous insufficiency (chronic) (peripheral) I89.0 Lymphedema, not elsewhere classified L97.812 Non-pressure chronic ulcer of other part of right lower leg with fat layer exposed E11.622 Type 2 diabetes mellitus with other skin ulcer I50.42 Chronic combined systolic (congestive) and diastolic (congestive) heart failure J44.9 Chronic obstructive pulmonary disease, unspecified Primary Wound Dressing o Collagen with Silver Secondary Dressing Wound #3 Right,Medial Lower Leg o Other - zorbact Dressing Change Frequency o Twice Weekly - return thursday or friday for Nurse visit return to [facility] on monday Edema Control Right Lower Extremity o 3 Layer Compression System - Right Lower Extremity Laboratory o Bacteria identified in Wound by Culture (MICRO) - non healing right lower leg wound - (ICD10 E11.622 - Type 2 diabetes mellitus with other skin ulcer) oooo LOINC Code: 6462-6 oooo Convenience Name: Wound culture routine Patient Medications Allergies: Antihistamines - Piperidine, diphenhydramine, Sulfa (Sulfonamide Antibiotics), Incruse Ellipta, clarithromycin, Spiriva with HandiHaler Notifications Medication Indication Start  End doxycycline hyclate 03/10/2020 DOSE 1 - oral 100 mg capsule - 1 capsule oral taken 2 times per day for 14 days clindamycin HCl 03/14/2020 DOSE 1 - oral 300 mg capsule - 1 capsule oral taken 4 times per day for 14 days Electronic Signature(s) Signed: 03/14/2020 4:31:33 PM By: Worthy Keeler PA-C Previous Signature: 03/10/2020 1:58:10 PM Version By: Worthy Keeler PA-C Entered By: Worthy Keeler on 03/14/2020 16:31:33 Alexandria [name] ([id number]) -------------------------------------------------------------------------------- Problem List Details Patient Name: Alexandria [name] Date of Service: 03/10/2020 12:30 PM Medical Record Number: [id number] Patient Account Number: [id number] Date of Birth/Sex: 06-27-1935 (84 y.o. F) Treating RN: Cornell Barman Primary Care Provider: Elsie Stain Other Clinician: Referring Provider: Elsie Stain Treating Provider/Extender: Skipper Cliche in Treatment: 0 Active Problems ICD-10 Encounter Code Description Active Date MDM Diagnosis I87.2 Venous insufficiency (chronic) (peripheral) 03/10/2020 No Yes I89.0 Lymphedema, not elsewhere classified 03/10/2020 No Yes L97.812 Non-pressure chronic ulcer of other part of right lower leg with fat layer 03/10/2020 No Yes exposed E11.622 Type 2 diabetes mellitus with other skin ulcer 03/10/2020 No Yes I50.42 Chronic combined systolic (congestive) and diastolic (congestive) heart 03/10/2020 No Yes failure J44.9 Chronic obstructive pulmonary disease, unspecified 03/10/2020 No Yes Inactive Problems Resolved Problems Electronic Signature(s) Signed: 03/10/2020 1:01:24 PM By: Worthy Keeler PA-C Entered By: Worthy Keeler on 03/10/2020 13:01:22 Alexandria [name] ([id number]) -------------------------------------------------------------------------------- Progress Note Details Patient Name: Alexandria [name] Date of Service: 03/10/2020 12:30 PM Medical Record Number: [id number] Patient Account Number: [id number] Date of  Birth/Sex: 12/09/1935 (84 y.o. F) Treating RN: Cornell Barman Primary Care Provider: Elsie Stain Other Clinician: Referring Provider: Elsie Stain Treating Provider/Extender: Skipper Cliche in Treatment: 0 Subjective Chief Complaint Information obtained from Patient Right LE Ulcer History of Present Illness (HPI) 85 year old diabetic patient who recently was seen in the ER for a knee laceration which was closed with staples and then Steri-Stripped. She then developed a cellulitis on her right lower extremity and had to be treated with Septra and was previously on Keflex. She was asked to use a leg immobilizer and limited range of motion to 45 at the knee. The patient is known to have a past medical history of depression, hypothyroidism, hypertension, diastolic CHF, hyperlipidemia, diabetes mellitus, peripheral neuropathy. she is being treated by her pulmonologist for COPD with emphysema with excarcebation and was given a course of prednisone recently. She also  was asked to use a CPAP machine and other symptomatic treatment was given. Last hemoglobin A1c was 5.8% 2 months ago. she used to be a heavy smoker but has quit about 11 years ago. 11/04/2016 -- the patient had a lower extremity venous duplex reflux evaluation done on 11/03/2016 and this showed no evidence of acute bilateral lower extremity deep vein thrombosis. She had reflux in bilateral common femoral veins and there was reflux in the bilateral saphenofemoral junction. However they were unable to obtain reflux in the bilateral great saphenous veins of the small saphenous veins and it was a difficult examination technically. [place] and wanted some advice about wound management while they were to be 11/12/2016 -- she returns after a vacation but has a new injury to her right lateral calf which she lacerated on a blunt object 11/25/2016 -- the patient still has not gotten in touch with her  cardiologist regarding her diuretics and she continues to have significant lymphedema right lower extremity was then left Readmission: 03/10/2020 upon evaluation today patient appears to be doing somewhat poorly in regard to her right lower extremity as a result of having dropped a 40 ounce jar of peanut butter on her leg about a month ago causing a traumatic injury. She does have significant swelling and even some very mild erythema she has been on Keflex for urinary tract infection and tells me that really the wound has improved significantly during the time she is been on the Keflex. Fortunately there is no signs of active infection at this time. No fevers, chills, nausea, vomiting, or diarrhea. The patient does have a history of chronic venous insufficiency, lymphedema, diabetes mellitus type 2, congestive heart failure, and COPD. Patient History Information obtained from Patient. Allergies Antihistamines - Piperidine, diphenhydramine, Sulfa (Sulfonamide Antibiotics), Incruse Ellipta, clarithromycin, Spiriva with HandiHaler Family History Cancer - Siblings, Heart Disease - Mother, Lung Disease - Father,Siblings, Thyroid Problems - Mother,Siblings,Siblings, No family history of Diabetes, Hereditary Spherocytosis, Hypertension, Kidney Disease, Seizures, Stroke, Tuberculosis. Social History Never smoker, Marital Status - Married, Alcohol Use - Rarely, Drug Use - No History, Caffeine Use - Daily. Medical History Eyes Denies history of Cataracts, Glaucoma, Optic Neuritis Ear/Nose/Mouth/Throat Denies history of Chronic sinus problems/congestion, Middle ear problems Hematologic/Lymphatic Patient has history of Anemia Denies history of Hemophilia, Human Immunodeficiency Virus, Lymphedema, Sickle Cell Disease Respiratory Patient has history of Chronic Obstructive Pulmonary Disease (COPD), Sleep Apnea Denies history of Aspiration, Asthma, Pneumothorax, Tuberculosis Cardiovascular Patient has  history of Congestive Heart Failure, Hypertension Denies history of Angina, Arrhythmia, Coronary Artery Disease, Deep Vein Thrombosis, Hypotension, Myocardial Infarction, Peripheral Arterial Disease, Peripheral Venous Disease, Phlebitis, Vasculitis Gastrointestinal Denies history of Cirrhosis , Colitis, Crohn s, Hepatitis A, Hepatitis B, Hepatitis C Endocrine ZARA, WENDT ([id number]) Patient has history of Type II Diabetes Denies history of Type I Diabetes Genitourinary Denies history of End Stage Renal Disease Immunological Denies history of Lupus Erythematosus, Raynaud s, Scleroderma Integumentary (Skin) Denies history of History of Burn, History of pressure wounds Musculoskeletal Denies history of Gout, Rheumatoid Arthritis, Osteoarthritis, Osteomyelitis Neurologic Patient has history of Neuropathy Denies history of Dementia, Quadriplegia, Paraplegia, Seizure Disorder Oncologic Denies history of Received Chemotherapy, Received Radiation Psychiatric Denies history of Anorexia/bulimia, Confinement Anxiety Patient is treated with Oral Agents. Review of Systems (ROS) Constitutional Symptoms (General Health) Denies complaints or symptoms of Fatigue, Fever, Chills, Marked Weight Change. Eyes Complains or has symptoms of Glasses / Contacts. Denies complaints or symptoms of  Dry Eyes, Vision Changes. Ear/Nose/Mouth/Throat Denies complaints or symptoms of Difficult clearing ears, Sinusitis. Hematologic/Lymphatic Denies complaints or symptoms of Bleeding / Clotting Disorders, Human Immunodeficiency Virus. Respiratory Denies complaints or symptoms of Chronic or frequent coughs, Shortness of Breath. Cardiovascular Denies complaints or symptoms of Chest pain, LE edema. Gastrointestinal Denies complaints or symptoms of Frequent diarrhea, Nausea, Vomiting. Endocrine Denies complaints or symptoms of Hepatitis, Thyroid disease, Polydypsia (Excessive Thirst). Genitourinary Denies  complaints or symptoms of Kidney failure/ Dialysis, Incontinence/dribbling. Immunological Denies complaints or symptoms of Hives, Itching. Integumentary (Skin) Complains or has symptoms of Wounds. Denies complaints or symptoms of Bleeding or bruising tendency, Breakdown, Swelling. Musculoskeletal Denies complaints or symptoms of Muscle Pain, Muscle Weakness. Neurologic Denies complaints or symptoms of Numbness/parasthesias, Focal/Weakness. Objective Constitutional sitting or standing blood pressure is within target range for patient.. pulse regular and within target range for patient.Marland Kitchen respirations regular, non- labored and within target range for patient.Marland Kitchen temperature within target range for patient.. Obese and well-hydrated in no acute distress. Vitals Time Taken: 12:32 PM, Height: 65 in, Source: Stated, Weight: 220 lbs, Source: Stated, BMI: 36.6, Temperature: 97.9 F, Pulse: 72 bpm, Respiratory Rate: 20 breaths/min, Blood Pressure: 133/78 mmHg. Eyes conjunctiva clear no eyelid edema noted. pupils equal round and reactive to light and accommodation. Ears, Nose, Mouth, and Throat no gross abnormality of ear auricles or external auditory canals. normal hearing noted during conversation. mucus membranes moist. Respiratory normal breathing without difficulty. Cardiovascular SHERLEY, MCKENNEY. ([id number]) 2+ pitting edema of the bilateral lower extremities. This is worse on the right compared to the left.. Musculoskeletal normal gait and posture. no significant deformity or arthritic changes, no loss or range of motion, no clubbing. Psychiatric this patient is able to make decisions and demonstrates good insight into disease process. Alert and Oriented x 3. pleasant and cooperative. General Notes: Upon inspection patient's wound bed actually showed signs of good granulation at this time there was some slough noted however that is can require sharp debridement and I did actually discussed  with the patient going ahead and performing debridement today in order to clear away some of the necrotic tissue. She agreed with this. Post debridement the wound bed appears to be doing much better which is great news. There does not appear to be any signs of infection which is also good news. With that being said we will continue to monitor for any signs of worsening in regard to infection likely give her a prescription for an antibiotic today as well. Integumentary (Hair, Skin) Wound #3 status is Open. Original cause of wound was Trauma. The wound is located on the Right,Medial Lower Leg. The wound measures 2cm length x 1.3cm width x 0.3cm depth; 2.042cm^2 area and 0.613cm^3 volume. There is Fat Layer (Subcutaneous Tissue) exposed. There is no tunneling or undermining noted. There is a medium amount of serosanguineous drainage noted. There is medium (34-66%) red, pink granulation within the wound bed. There is a medium (34-66%) amount of necrotic tissue within the wound bed including Adherent Slough. Assessment Active Problems ICD-10 Venous insufficiency (chronic) (peripheral) Lymphedema, not elsewhere classified Non-pressure chronic ulcer of other part of right lower leg with fat layer exposed Type 2 diabetes mellitus with other skin ulcer Chronic combined systolic (congestive) and diastolic (congestive) heart failure Chronic obstructive pulmonary disease, unspecified Procedures Wound #3 Pre-procedure diagnosis of Wound #3 is a Diabetic Wound/Ulcer of the Lower Extremity located on the Right,Medial Lower Leg .Severity of Tissue Pre Debridement is: Fat layer exposed. There was a Excisional  Skin/Subcutaneous Tissue Debridement with a total area of 2.6 sq cm performed by Tommie Sams., PA-C. With the following instrument(s): Curette to remove Viable and Non-Viable tissue/material. Material removed includes Subcutaneous Tissue, Slough, Skin: Dermis, and Skin: Epidermis. 1 specimen was taken  by a Tissue Culture and sent to the lab per facility protocol. A time out was conducted at 13:03, prior to the start of the procedure. A Moderate amount of bleeding was controlled with Pressure. The procedure was tolerated well with a pain level of 4 throughout and a pain level of 2 following the procedure. Post Debridement Measurements: 2cm length x 1.3cm width x 0.3cm depth; 0.613cm^3 volume. Character of Wound/Ulcer Post Debridement is improved. Severity of Tissue Post Debridement is: Fat layer exposed. Post procedure Diagnosis Wound #3: Same as Pre-Procedure Plan Primary Wound Dressing: Collagen with Silver Secondary Dressing: Wound #3 Right,Medial Lower Leg: Other - zorbact Dressing Change Frequency: Twice Weekly - return thursday or friday for Nurse visit return to [facility] on monday Edema Control: 3 Layer Compression System - Right Lower Extremity Laboratory ordered were: Wound culture routine - non healing right lower leg wound The following medication(s) was prescribed: doxycycline hyclate oral 100 mg capsule 1 1 capsule oral taken 2 times per day for 14 days starting 03/10/2020 BRITTANNI, CARIKER. ([id number]) 1. Would recommend currently that we go ahead and initiate treatment with a silver collagen dressing to the wound I think this would be one of the best ways to go at this point. 2. I am also can recommend XtraSorb over top in order to help with catching excessive drainage and edema control. 3. I am also can recommend a 3 layer compression at this point to try to help with controlling some of the weeping and edema as well. 4. I am going to place her on doxycycline as we were getting use Bactrim but she appears to be allergic to this though she did not tell me about when I asked her we do have it in the records from previous that she is allergic. We will see patient back for reevaluation in 1 week here in the clinic. If anything worsens or changes patient will contact our office  for additional recommendations. We will see her back in a few days to change out her wrap as I am afraid is can start sliding when she starts losing some of the fluid and edema in the lower extremity. She is in agreement with that plan. Electronic Signature(s) Signed: 03/10/2020 1:58:29 PM By: Worthy Keeler PA-C Entered By: Worthy Keeler on 03/10/2020 13:58:28 Alexandria [name] ([id number]) -------------------------------------------------------------------------------- ROS/PFSH Details Patient Name: Alexandria [name] Date of Service: 03/10/2020 12:30 PM Medical Record Number: [id number] Patient Account Number: [id number] Date of Birth/Sex: 1935-08-21 (84 y.o. F) Treating RN: Carlene Coria Primary Care Provider: Elsie Stain Other Clinician: Referring Provider: Elsie Stain Treating Provider/Extender: Skipper Cliche in Treatment: 0 Information Obtained From Patient Constitutional Symptoms (General Health) Complaints and Symptoms: Negative for: Fatigue; Fever; Chills; Marked Weight Change Eyes Complaints and Symptoms: Positive for: Glasses / Contacts Negative for: Dry Eyes; Vision Changes Medical History: Negative for: Cataracts; Glaucoma; Optic Neuritis Ear/Nose/Mouth/Throat Complaints and Symptoms: Negative for: Difficult clearing ears; Sinusitis Medical History: Negative for: Chronic sinus problems/congestion; Middle ear problems Hematologic/Lymphatic Complaints and Symptoms: Negative for: Bleeding / Clotting Disorders; Human Immunodeficiency Virus Medical History: Positive for: Anemia Negative for: Hemophilia; Human Immunodeficiency Virus; Lymphedema; Sickle Cell Disease Respiratory Complaints and Symptoms: Negative for: Chronic or frequent coughs; Shortness  of Breath Medical History: Positive for: Chronic Obstructive Pulmonary Disease (COPD); Sleep Apnea Negative for: Aspiration; Asthma; Pneumothorax; Tuberculosis Cardiovascular Complaints and Symptoms: Negative  for: Chest pain; LE edema Medical History: Positive for: Congestive Heart Failure; Hypertension Negative for: Angina; Arrhythmia; Coronary Artery Disease; Deep Vein Thrombosis; Hypotension; Myocardial Infarction; Peripheral Arterial Disease; Peripheral Venous Disease; Phlebitis; Vasculitis Gastrointestinal Complaints and Symptoms: Negative for: Frequent diarrhea; Nausea; Vomiting Medical History: Negative for: Cirrhosis ; Colitis; Crohnos; Hepatitis A; Hepatitis B; Hepatitis C Sharrar, Avarose E. ([id number]) Endocrine Complaints and Symptoms: Negative for: Hepatitis; Thyroid disease; Polydypsia (Excessive Thirst) Medical History: Positive for: Type II Diabetes Negative for: Type I Diabetes Time with diabetes: 10 years Treated with: Oral agents Genitourinary Complaints and Symptoms: Negative for: Kidney failure/ Dialysis; Incontinence/dribbling Medical History: Negative for: End Stage Renal Disease Immunological Complaints and Symptoms: Negative for: Hives; Itching Medical History: Negative for: Lupus Erythematosus; Raynaudos; Scleroderma Integumentary (Skin) Complaints and Symptoms: Positive for: Wounds Negative for: Bleeding or bruising tendency; Breakdown; Swelling Medical History: Negative for: History of Burn; History of pressure wounds Musculoskeletal Complaints and Symptoms: Negative for: Muscle Pain; Muscle Weakness Medical History: Negative for: Gout; Rheumatoid Arthritis; Osteoarthritis; Osteomyelitis Neurologic Complaints and Symptoms: Negative for: Numbness/parasthesias; Focal/Weakness Medical History: Positive for: Neuropathy Negative for: Dementia; Quadriplegia; Paraplegia; Seizure Disorder Oncologic Medical History: Negative for: Received Chemotherapy; Received Radiation Psychiatric Medical History: Negative for: Anorexia/bulimia; Confinement Anxiety Immunizations Pneumococcal Vaccine: Received Pneumococcal Vaccination: Yes Implantable  Devices None TERRIANA, BARRERAS ([id number]) Family and Social History Cancer: Yes - Siblings; Diabetes: No; Heart Disease: Yes - Mother; Hereditary Spherocytosis: No; Hypertension: No; Kidney Disease: No; Lung Disease: Yes - Father,Siblings; Seizures: No; Stroke: No; Thyroid Problems: Yes - Mother,Siblings,Siblings; Tuberculosis: No; Never smoker; Marital Status - Married; Alcohol Use: Rarely; Drug Use: No History; Caffeine Use: Daily; Financial Concerns: No; Food, Clothing or Shelter Needs: No; Support System Lacking: No; Transportation Concerns: No Electronic Signature(s) Signed: 03/10/2020 2:13:03 PM By: Worthy Keeler PA-C Signed: 03/14/2020 4:22:48 PM By: Carlene Coria RN Entered By: Carlene Coria on 03/10/2020 12:43:40 Alexandria [name] ([id number]) -------------------------------------------------------------------------------- SuperBill Details Patient Name: Alexandria [name] Date of Service: 03/10/2020 Medical Record Number: [id number] Patient Account Number: [id number] Date of Birth/Sex: 05-15-1935 (84 y.o. F) Treating RN: Carlene Coria Primary Care Provider: Elsie Stain Other Clinician: Referring Provider: Elsie Stain Treating Provider/Extender: Skipper Cliche in Treatment: 0 Diagnosis Coding ICD-10 Codes Code Description I87.2 Venous insufficiency (chronic) (peripheral) I89.0 Lymphedema, not elsewhere classified L97.812 Non-pressure chronic ulcer of other part of right lower leg with fat layer exposed E11.622 Type 2 diabetes mellitus with other skin ulcer I50.42 Chronic combined systolic (congestive) and diastolic (congestive) heart failure J44.9 Chronic obstructive pulmonary disease, unspecified Facility Procedures CPT4 Code: 51884166 Description: 99213 - WOUND CARE VISIT-LEV 3 EST PT Modifier: 25 Quantity: 1 CPT4 Code: 06301601 Description: 11042 - DEB SUBQ TISSUE 20 SQ CM/< Modifier: Quantity: 1 CPT4 Code: Description: ICD-10 Diagnosis Description E11.622 Type 2  diabetes mellitus with other skin ulcer Modifier: Quantity: Physician Procedures CPT4 Code: 0932355 Description: 73220 - WC PHYS LEVEL 4 - NEW PT Modifier: 25 Quantity: 1 CPT4 Code: Description: ICD-10 Diagnosis Description I87.2 Venous insufficiency (chronic) (peripheral) I89.0 Lymphedema, not elsewhere classified L97.812 Non-pressure chronic ulcer of other part of right lower leg with fat lay E11.622 Type 2 diabetes mellitus with  other skin ulcer Modifier: er exposed Quantity: CPT4 Code: 2542706 Description: 11042 - WC PHYS SUBQ TISS 20 SQ CM Modifier: Quantity: 1 CPT4 Code: Description: ICD-10 Diagnosis Description E11.622 Type 2 diabetes mellitus  with other skin ulcer Modifier: Quantity: Electronic Signature(s) Signed: 03/10/2020 2:01:28 PM By: Worthy Keeler PA-C Entered By: Worthy Keeler on 03/10/2020 14:01:28

## 2020-03-14 NOTE — Telephone Encounter (Signed)
Spoke with the pt  She states that her symbicort copay was $800  She asked for alternative that is cheaper  I advised that she will need to contact her insurance for this and let us know what the preferred inhalers are  She verbalized understanding and will call back

## 2020-03-14 NOTE — Progress Notes (Signed)
Alexandria, [name] ([id number]) Visit Report for 03/10/2020 Abuse/Suicide Risk Screen Details Patient Name: Alexandria [name], Alexandria [name] Date of Service: 03/10/2020 12:30 PM Medical Record Number: [id number] Patient Account Number: [id number] Date of Birth/Sex: 04/19/1935 (85 y.o. F) Treating RN: Carlene Coria Primary Care Keena Heesch: Elsie Stain Other Clinician: Referring Nicklas Mcsweeney: Elsie Stain Treating Colen Eltzroth/Extender: Skipper Cliche in Treatment: 0 Abuse/Suicide Risk Screen Items Answer ABUSE RISK SCREEN: Has anyone [name] to you tried to hurt or harm you recentlyo No Do you feel uncomfortable with anyone in your familyo No Has anyone forced you do things that you didnot want to doo No Electronic Signature(s) Signed: 03/14/2020 4:22:48 PM By: Carlene Coria RN Entered By: Carlene Coria on 03/10/2020 12:45:19 Alexandria [name] ([id number]) -------------------------------------------------------------------------------- Activities of Daily Living Details Patient Name: Alexandria [name] Date of Service: 03/10/2020 12:30 PM Medical Record Number: [id number] Patient Account Number: [id number] Date of Birth/Sex: 07-06-1935 (84 y.o. F) Treating RN: Carlene Coria Primary Care Arsal Tappan: Elsie Stain Other Clinician: Referring Legacy Carrender: Elsie Stain Treating Zeriah Baysinger/Extender: Skipper Cliche in Treatment: 0 Activities of Daily Living Items Answer Activities of Daily Living (Please select one for each item) Drive Automobile Not Able Take Medications Completely Able Use Telephone Completely Able Care for Appearance Completely Able Use Toilet Completely Able Bath / Shower Completely Able Dress Self Completely Able Feed Self Completely Able Walk Completely Able Get In / Out Bed Completely Able Housework Completely Able Prepare Meals Completely Able Handle Money Completely Able Shop for Self Completely Able Electronic Signature(s) Signed: 03/14/2020 4:22:48 PM By: Carlene Coria RN Entered By:  Carlene Coria on 03/10/2020 12:45:47 Alexandria [name] ([id number]) -------------------------------------------------------------------------------- Education Screening Details Patient Name: Alexandria [name] Date of Service: 03/10/2020 12:30 PM Medical Record Number: [id number] Patient Account Number: [id number] Date of Birth/Sex: 27-Jan-1936 (84 y.o. F) Treating RN: Carlene Coria Primary Care Sharri Loya: Elsie Stain Other Clinician: Referring Gazelle Towe: Elsie Stain Treating Halford Goetzke/Extender: Skipper Cliche in Treatment: 0 Primary Learner Assessed: Patient Learning Preferences/Education Level/Primary Language Learning Preference: Explanation Highest Education Level: High School Preferred Language: English Cognitive Barrier Language Barrier: No Translator Needed: No Memory Deficit: No Emotional Barrier: No Cultural/Religious Beliefs Affecting Medical Care: No Physical Barrier Impaired Vision: Yes Glasses Impaired Hearing: No Decreased Hand dexterity: No Knowledge/Comprehension Knowledge Level: Medium Comprehension Level: High Ability to understand written instructions: High Ability to understand verbal instructions: High Motivation Anxiety Level: Anxious Cooperation: Cooperative Education Importance: Acknowledges Need Interest in Health Problems: Asks Questions Perception: Coherent Willingness to Engage in Self-Management High Activities: Readiness to Engage in Self-Management High Activities: Electronic Signature(s) Signed: 03/14/2020 4:22:48 PM By: Carlene Coria RN Entered By: Carlene Coria on 03/10/2020 12:46:41 Alexandria [name] ([id number]) -------------------------------------------------------------------------------- Fall Risk Assessment Details Patient Name: Alexandria [name] Date of Service: 03/10/2020 12:30 PM Medical Record Number: [id number] Patient Account Number: [id number] Date of Birth/Sex: [birth year] (84 y.o. F) Treating RN: Carlene Coria Primary Care  Jakhiya Brower: Elsie Stain Other Clinician: Referring Antanasia Kaczynski: Elsie Stain Treating Itsel Opfer/Extender: Skipper Cliche in Treatment: 0 Fall Risk Assessment Items Have you had 2 or more falls in the last 12 monthso 0 No Have you had any fall that resulted in injury in the last 12 monthso 0 No FALLS RISK SCREEN History of falling - immediate or within 3 months 0 No Secondary diagnosis (Do you have 2 or more medical diagnoseso) 0 No Ambulatory aid None/bed rest/wheelchair/nurse 0 No Crutches/cane/walker 0 No Furniture 0 No Intravenous therapy Access/Saline/Heparin Lock 0 No Gait/Transferring Normal/ bed rest/ wheelchair 0 No Weak (short steps with  or without shuffle, stooped but able to lift head while walking, may 0 No seek support from furniture) Impaired (short steps with shuffle, may have difficulty arising from chair, head down, impaired 0 No balance) Mental Status Oriented to own ability 0 No Electronic Signature(s) Signed: 03/14/2020 4:22:48 PM By: Carlene Coria RN Entered By: Carlene Coria on 03/10/2020 12:47:04 Alexandria [name] ([id number]) -------------------------------------------------------------------------------- Foot Assessment Details Patient Name: Alexandria [name] Date of Service: 03/10/2020 12:30 PM Medical Record Number: [id number] Patient Account Number: [id number] Date of Birth/Sex: [birth year] (84 y.o. F) Treating RN: Carlene Coria Primary Care Marlissa Emerick: Elsie Stain Other Clinician: Referring Heyward Douthit: Elsie Stain Treating Thad Osoria/Extender: Skipper Cliche in Treatment: 0 Foot Assessment Items Site Locations + = Sensation present, - = Sensation absent, C = Callus, U = Ulcer R = Redness, W = Warmth, M = Maceration, PU = Pre-ulcerative lesion F = Fissure, S = Swelling, D = Dryness Assessment Right: Left: Other Deformity: No No Prior Foot Ulcer: No No Prior Amputation: No No Charcot Joint: No No Ambulatory Status: Ambulatory With  Help Assistance Device: Walker Gait: Steady Electronic Signature(s) Signed: 03/14/2020 4:22:48 PM By: Carlene Coria RN Entered By: Carlene Coria on 03/10/2020 12:57:37 Alexandria [name] ([id number]) -------------------------------------------------------------------------------- Nutrition Risk Screening Details Patient Name: Alexandria [name] Date of Service: 03/10/2020 12:30 PM Medical Record Number: [id number] Patient Account Number: [id number] Date of Birth/Sex: 05/15/1935 (84 y.o. F) Treating RN: Carlene Coria Primary Care Demondre Aguas: Elsie Stain Other Clinician: Referring Kamin Niblack: Elsie Stain Treating Kasen Sako/Extender: Jeri Cos Weeks in Treatment: 0 Height (in): 65 Weight (lbs): 220 Body Mass Index (BMI): 36.6 Nutrition Risk Screening Items Score Screening NUTRITION RISK SCREEN: I have an illness or condition that made me change the kind and/or amount of food I eat 0 No I eat fewer than two meals per day 0 No I eat few fruits and vegetables, or milk products 0 No I have three or more drinks of beer, liquor or wine almost every day 0 No I have tooth or mouth problems that make it hard for me to eat 0 No I don't always have enough money to buy the food I need 0 No I eat alone most of the time 0 No I take three or more different prescribed or over-the-counter drugs a day 1 Yes Without wanting to, I have lost or gained 10 pounds in the last six months 0 No I am not always physically able to shop, cook and/or feed myself 2 Yes Nutrition Protocols Good Risk Protocol Moderate Risk Protocol 0 Provide education on nutrition High Risk Proctocol Risk Level: Moderate Risk Score: 3 Electronic Signature(s) Signed: 03/14/2020 4:22:48 PM By: Carlene Coria RN Entered By: Carlene Coria on 03/10/2020 12:47:16

## 2020-03-14 NOTE — Progress Notes (Addendum)
Alexandria [name], Alexandria [name] ([id number]) Visit Report for 03/14/2020 Arrival Information Details Patient Name: Alexandria [name], Alexandria [name] Date of Service: 03/14/2020 9:30 AM Medical Record Number: [id number] Patient Account Number: [id number] Date of Birth/Sex: 1935-09-27 (85 y.o. F) Treating RN: Cornell Barman Primary Care Baylynn Shifflett: Elsie Stain Other Clinician: Referring Deondrick Searls: Elsie Stain Treating Roverto Bodmer/Extender: Skipper Cliche in Treatment: 0 Visit Information History Since Last Visit Has Dressing in Place as Prescribed: Yes Patient Arrived: Walker Pain Present Now: No Arrival Time: 09:30 Accompanied By: self Transfer Assistance: None Patient Identification Verified: Yes Secondary Verification Process Completed: Yes Patient Requires Transmission-Based Precautions: No Patient Has Alerts: No Electronic Signature(s) Signed: 03/14/2020 11:50:33 AM By: Gretta Cool, BSN, RN, CWS, Kim RN, BSN Entered By: Gretta Cool, BSN, RN, CWS, Kim on 03/14/2020 11:50:32 Alexandria [name] ([id number]) -------------------------------------------------------------------------------- Compression Therapy Details Patient Name: Alexandria [name] Date of Service: 03/14/2020 9:30 AM Medical Record Number: [id number] Patient Account Number: [id number] Date of Birth/Sex: April 19, 1935 (84 y.o. F) Treating RN: Cornell Barman Primary Care Kaelene Elliston: Elsie Stain Other Clinician: Referring Jalasia Eskridge: Elsie Stain Treating Demarri Elie/Extender: Jeri Cos Weeks in Treatment: 0 Compression Therapy Performed for Wound Assessment: Wound #3 Right,Medial Lower Leg Performed By: Clinician Cornell Barman, RN Compression Type: Three Layer Pre Treatment ABI: 1.4 Notes Patient tolerating wrap well. Electronic Signature(s) Signed: 03/14/2020 11:51:12 AM By: Gretta Cool, BSN, RN, CWS, Kim RN, BSN Entered By: Gretta Cool, BSN, RN, CWS, Kim on 03/14/2020 11:51:12 Alexandria [name]  ([id number]) -------------------------------------------------------------------------------- Encounter Discharge Information Details Patient Name: Alexandria [name] Date of Service: 03/14/2020 9:30 AM Medical Record Number: [id number] Patient Account Number: [id number] Date of Birth/Sex: 27-Jun-1935 (84 y.o. F) Treating RN: Cornell Barman Primary Care Yatzary Merriweather: Elsie Stain Other Clinician: Referring Montrae Braithwaite: Elsie Stain Treating Jeronda Don/Extender: Skipper Cliche in Treatment: 0 Encounter Discharge Information Items Discharge Condition: Stable Ambulatory Status: Walker Discharge Destination: Home Transportation: Private Auto Accompanied By: self Schedule Follow-up Appointment: Yes Clinical Summary of Care: Electronic Signature(s) Signed: 03/14/2020 11:52:08 AM By: Gretta Cool, BSN, RN, CWS, Kim RN, BSN Entered By: Gretta Cool, BSN, RN, CWS, Kim on 03/14/2020 11:52:08 Alexandria [name] ([id number]) -------------------------------------------------------------------------------- Wound Assessment Details Patient Name: Alexandria [name] Date of Service: 03/14/2020 9:30 AM Medical Record Number: [id number] Patient Account Number: [id number] Date of Birth/Sex: 1936-02-23 (84 y.o. F) Treating RN: Cornell Barman Primary Care Aurore Redinger: Elsie Stain Other Clinician: Referring Tyona Nilsen: Elsie Stain Treating Theodoro Koval/Extender: Jeri Cos Weeks in Treatment: 0 Wound Status Wound Number: 3 Primary Etiology: Diabetic Wound/Ulcer of the Lower Extremity Wound Location: Right, Medial Lower Leg Wound Status: Open Wounding Event: Trauma Date Acquired: 01/31/2020 Weeks Of Treatment: 0 Clustered Wound: No Wound Measurements Length: (cm) 2 Width: (cm) 1.3 Depth: (cm) 0.3 Area: (cm) 2.042 Volume: (cm) 0.613 % Reduction in Area: 0% % Reduction in Volume: 0% Wound Description Classification: Grade 2 Treatment Notes Wound #3 (Lower Leg) Wound Laterality: Right, Medial Cleanser Peri-Wound  Care Topical Primary Dressing Secondary Dressing Secured With Compression Wrap Compression Stockings Add-Ons Electronic Signature(s) Signed: 03/14/2020 5:26:41 PM By: Gretta Cool, BSN, RN, CWS, Kim RN, BSN Entered By: Gretta Cool, BSN, RN, CWS, Kim on 03/14/2020 11:50:41

## 2020-03-14 NOTE — Progress Notes (Signed)
MELVA, FAUX ([id number]) Visit Report for 03/10/2020 Allergy List Details Patient Name: Alexandria [name], Alexandria [name] Date of Service: 03/10/2020 12:30 PM Medical Record Number: [id number] Patient Account Number: [id number] Date of Birth/Sex: 09/10/1935 (85 y.o. F) Treating RN: Carlene Coria Primary Care Osmani Kersten: Elsie Stain Other Clinician: Referring Cozette Braggs: Elsie Stain Treating Sadeel Fiddler/Extender: Jeri Cos Weeks in Treatment: 0 Allergies Active Allergies Antihistamines - Piperidine Type: Allergen diphenhydramine Type: Food Sulfa (Sulfonamide Antibiotics) Type: Allergen Incruse Ellipta Type: Medication clarithromycin Type: Food Spiriva with HandiHaler Type: Medication Allergy Notes Electronic Signature(s) Signed: 03/14/2020 4:22:48 PM By: Carlene Coria RN Entered By: Carlene Coria on 03/10/2020 12:41:19 Alexandria [name] ([id number]) -------------------------------------------------------------------------------- Arrival Information Details Patient Name: Alexandria [name] Date of Service: 03/10/2020 12:30 PM Medical Record Number: [id number] Patient Account Number: [id number] Date of Birth/Sex: 1935-06-19 (84 y.o. F) Treating RN: Carlene Coria Primary Care Darl Brisbin: Elsie Stain Other Clinician: Referring Trinda Harlacher: Elsie Stain Treating Ayvion Kavanagh/Extender: Skipper Cliche in Treatment: 0 Visit Information Patient Arrived: [name] Arrival Time: 12:23 Accompanied By: self Transfer Assistance: None Patient Identification Verified: Yes Secondary Verification Process Completed: Yes Patient Requires Transmission-Based Precautions: No Patient Has Alerts: No History Since Last Visit All ordered tests and consults were completed: No Added or deleted any medications: No Any new allergies or adverse reactions: No Had a fall or experienced change in activities of daily living that may affect risk of falls: No Signs or symptoms of abuse/neglect since last visito No Hospitalized  since last visit: No Implantable device outside of the clinic excluding cellular tissue based products placed in the center since last visit: No Electronic Signature(s) Signed: 03/14/2020 4:22:48 PM By: Carlene Coria RN Entered By: Carlene Coria on 03/10/2020 12:31:25 Alexandria [name] ([id number]) -------------------------------------------------------------------------------- Clinic Level of Care Assessment Details Patient Name: Alexandria [name] Date of Service: 03/10/2020 12:30 PM Medical Record Number: [id number] Patient Account Number: [id number] Date of Birth/Sex: June 20, 1935 (84 y.o. F) Treating RN: Carlene Coria Primary Care Rosselyn Martha: Elsie Stain Other Clinician: Referring Ramonda Galyon: Elsie Stain Treating Kayton Dunaj/Extender: Skipper Cliche in Treatment: 0 Clinic Level of Care Assessment Items TOOL 1 Quantity Score X - Use when EandM and Procedure is performed on INITIAL visit 1 0 ASSESSMENTS - Nursing Assessment / Reassessment X - General Physical Exam (combine w/ comprehensive assessment (listed just below) when performed on new 1 20 pt. evals) X- 1 25 Comprehensive Assessment (HX, ROS, Risk Assessments, Wounds Hx, etc.) ASSESSMENTS - Wound and Skin Assessment / Reassessment []  - Dermatologic / Skin Assessment (not related to wound area) 0 ASSESSMENTS - Ostomy and/or Continence Assessment and Care []  - Incontinence Assessment and Management 0 []  - 0 Ostomy Care Assessment and Management (repouching, etc.) PROCESS - Coordination of Care X - Simple Patient / Family Education for ongoing care 1 15 []  - 0 Complex (extensive) Patient / Family Education for ongoing care X- 1 10 Staff obtains Programmer, systems, Records, Test Results / Process Orders []  - 0 Staff telephones HHA, Nursing Homes / Clarify orders / etc []  - 0 Routine Transfer to another Facility (non-emergent condition) []  - 0 Routine Hospital Admission (non-emergent condition) X- 1 15 New Admissions / Medical laboratory scientific officer / Ordering NPWT, Apligraf, etc. []  - 0 Emergency Hospital Admission (emergent condition) PROCESS - Special Needs []  - Pediatric / Minor Patient Management 0 []  - 0 Isolation Patient Management []  - 0 Hearing / Language / Visual special needs []  - 0 Assessment of Community assistance (transportation, D/C planning, etc.) []  - 0 Additional assistance / Altered mentation []  -  0 Support Surface(s) Assessment (bed, cushion, seat, etc.) INTERVENTIONS - Miscellaneous []  - External ear exam 0 []  - 0 Patient Transfer (multiple staff / Civil Service fast streamer / Similar devices) []  - 0 Simple Staple / Suture removal (25 or less) []  - 0 Complex Staple / Suture removal (26 or more) []  - 0 Hypo/Hyperglycemic Management (do not check if billed separately) []  - 0 Ankle / Brachial Index (ABI) - do not check if billed separately Has the patient been seen at the hospital within the last three years: Yes Total Score: 85 Level Of Care: New/Established - Level 3 MARGARET, COCKERILL ([id number]) Electronic Signature(s) Signed: 03/14/2020 4:22:48 PM By: Carlene Coria RN Entered By: Carlene Coria on 03/10/2020 13:14:36 Alexandria [name] ([id number]) -------------------------------------------------------------------------------- Lower Extremity Assessment Details Patient Name: Alexandria [name] Date of Service: 03/10/2020 12:30 PM Medical Record Number: [id number] Patient Account Number: [id number] Date of Birth/Sex: 1935-07-29 (84 y.o. F) Treating RN: Carlene Coria Primary Care Hermina Barnard: Elsie Stain Other Clinician: Referring Rami Budhu: Elsie Stain Treating Barbarajean Kinzler/Extender: Jeri Cos Weeks in Treatment: 0 Edema Assessment Assessed: [name]: No] [Right: No] [Left: Edema] [Right: :] Calf Left: Right: Point of Measurement: 37 cm From Medial Instep 43 cm Ankle Left: Right: Point of Measurement: 10 cm From Medial Instep 24.3 cm Vascular Assessment Blood Pressure: Brachial:  [Right:133] Ankle: [Right:Dorsalis Pedis: 186 1.40] Electronic Signature(s) Signed: 03/14/2020 4:22:48 PM By: Carlene Coria RN Entered By: Carlene Coria on 03/10/2020 12:55:59 Alexandria [name] ([id number]) -------------------------------------------------------------------------------- Multi Wound Chart Details Patient Name: Alexandria [name] Date of Service: 03/10/2020 12:30 PM Medical Record Number: [id number] Patient Account Number: [id number] Date of Birth/Sex: 10/22/1935 (84 y.o. 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 Comorbid History: Anemia, Chronic Obstructive N/A N/A Pulmonary Disease (COPD), Sleep Apnea, Congestive Heart Failure, Hypertension, Type II Diabetes, Neuropathy Date Acquired: 01/31/2020 N/A N/A Weeks of Treatment: 0 N/A N/A Wound Status: Open N/A N/A Measurements L x W x D (cm) 2x1.3x0.3 N/A N/A Area (cm) : 2.042 N/A N/A Volume (cm) : 0.613 N/A N/A % Reduction in Area: 0.00% N/A N/A % Reduction in Volume: 0.00% N/A N/A Classification: Grade 2 N/A N/A Exudate Amount: Medium N/A N/A Exudate Type: Serosanguineous N/A N/A Exudate Color: red, brown N/A N/A Granulation Amount: Medium (34-66%) N/A N/A Granulation Quality: Red, Pink N/A N/A Necrotic Amount: Medium (34-66%) N/A N/A Exposed Structures: Fat Layer (Subcutaneous Tissue): N/A N/A Yes Fascia: No Tendon: No Muscle: No Joint: No Bone: No Epithelialization: None N/A N/A Treatment Notes Electronic Signature(s) Signed:  03/14/2020 4:22:48 PM By: Carlene Coria RN Entered By: Carlene Coria on 03/10/2020 13:04:40 Alexandria [name] ([id number]) -------------------------------------------------------------------------------- Multi-Disciplinary Care Plan Details Patient Name: Alexandria [name] Date of Service: 03/10/2020 12:30 PM Medical Record Number: [id number] Patient Account Number: [id number] Date of Birth/Sex: 06-02-1935 (84 y.o. F) Treating RN: Carlene Coria Primary Care Nickey Kloepfer: Elsie Stain Other Clinician: Referring Caelynn Marshman: Elsie Stain Treating Leith Hedlund/Extender: Skipper Cliche in Treatment: 0 Active Inactive Wound/Skin Impairment Nursing Diagnoses: Knowledge deficit related to ulceration/compromised skin integrity Goals: Patient/caregiver will verbalize understanding of skin care regimen Date Initiated: 03/10/2020 Target Resolution Date: 04/10/2020 Goal Status: Active Ulcer/skin breakdown will have a volume  reduction of 30% by week 4 Date Initiated: 03/10/2020 Target Resolution Date: 04/10/2020 Goal Status: Active Interventions: Assess patient/caregiver ability to obtain necessary supplies Assess patient/caregiver ability to perform ulcer/skin care regimen upon admission and as needed Assess ulceration(s) every visit Notes: Electronic Signature(s) Signed: 03/14/2020 4:22:48 PM By: Carlene Coria RN Entered By: Carlene Coria on 03/10/2020 13:04:15 Alexandria [name] ([id number]) -------------------------------------------------------------------------------- Pain Assessment Details Patient Name: Alexandria [name] Date of Service: 03/10/2020 12:30 PM Medical Record Number: [id number] Patient Account Number: [id number] Date of Birth/Sex: 11-May-1935 (84 y.o. F) Treating RN: Carlene Coria Primary Care Shriya Aker: Elsie Stain Other Clinician: Referring Claretha Townshend: Elsie Stain Treating Laurel Smeltz/Extender: Skipper Cliche in Treatment: 0 Active Problems Location of Pain Severity and Description of  Pain Patient Has Paino No Site Locations Pain Management and Medication Current Pain Management: Electronic Signature(s) Signed: 03/14/2020 4:22:48 PM By: Carlene Coria RN Entered By: Carlene Coria on 03/10/2020 12:31:41 Alexandria [name] ([id number]) -------------------------------------------------------------------------------- Patient/Caregiver Education Details Patient Name: Alexandria [name] Date of Service: 03/10/2020 12:30 PM Medical Record Number: [id number] Patient Account Number: [id number] Date of Birth/Gender: [birth year] (84 y.o. F) Treating RN: Carlene Coria Primary Care Physician: Elsie Stain Other Clinician: Referring Physician: Elsie Stain Treating Physician/Extender: Skipper Cliche in Treatment: 0 Education Assessment Education Provided To: Patient Education Topics Provided Wound/Skin Impairment: Methods: Explain/Verbal Responses: State content correctly Electronic Signature(s) Signed: 03/14/2020 4:22:48 PM By: Carlene Coria RN Entered By: Carlene Coria on 03/10/2020 13:46:47 Alexandria [name] ([id number]) -------------------------------------------------------------------------------- Wound Assessment Details Patient Name: Alexandria [name] Date of Service: 03/10/2020 12:30 PM Medical Record Number: [id number] Patient Account Number: [id number] Date of Birth/Sex: 05/07/1935 (84 y.o. F) Treating RN: Carlene Coria Primary Care Kameela Leipold: Elsie Stain Other Clinician: Referring Quiera Diffee: Elsie Stain Treating Rossie Scarfone/Extender: Jeri Cos Weeks in Treatment: 0 Wound Status Wound Number: 3 Primary Diabetic Wound/Ulcer of the Lower Extremity Etiology: Wound Location: Right, Medial Lower Leg Wound Open Wounding Event: Trauma Status: Date Acquired: 01/31/2020 Comorbid Anemia, Chronic Obstructive Pulmonary Disease (COPD), Weeks Of Treatment: 0 History: Sleep Apnea, Congestive Heart Failure, Hypertension, Clustered Wound: No Type II Diabetes,  Neuropathy Photos Wound Measurements Length: (cm) 2 Width: (cm) 1.3 Depth: (cm) 0.3 Area: (cm) 2.042 Volume: (cm) 0.613 % Reduction in Area: 0% % Reduction in Volume: 0% Epithelialization: None Tunneling: No Undermining: No Wound Description Classification: Grade 2 Exudate Amount: Medium Exudate Type: Serosanguineous Exudate Color: red, brown Foul Odor After Cleansing: No Slough/Fibrino Yes Wound Bed Granulation Amount: Medium (34-66%) Exposed Structure Granulation Quality: Red, Pink Fascia Exposed: No Necrotic Amount: Medium (34-66%) Fat Layer (Subcutaneous Tissue) Exposed: Yes Necrotic Quality: Adherent Slough Tendon Exposed: No Muscle Exposed: No Joint Exposed: No Bone Exposed: No Electronic Signature(s) Signed: 03/14/2020 4:22:48 PM By: Carlene Coria RN Entered By: Carlene Coria on 03/10/2020 12:56:50 Alexandria [name] ([id number]) -------------------------------------------------------------------------------- Vitals Details Patient Name: Alexandria [name] Date of Service: 03/10/2020 12:30 PM Medical Record Number: [id number] Patient Account Number: [id number] Date of Birth/Sex: 03/27/1935 (85 y.o. F) Treating RN: Carlene Coria Primary Care Howell Groesbeck: Elsie Stain Other Clinician: Referring Joss Friedel: Elsie Stain Treating Kirk Basquez/Extender: Skipper Cliche in Treatment: 0 Vital Signs Time Taken: 12:32 Temperature (F): 97.9 Height (in): 65 Pulse (bpm): 72 Source: Stated Respiratory Rate (breaths/min): 20 Weight (lbs): 220 Blood Pressure (mmHg): 133/78 Source: Stated Reference Range: 80 - 120 mg / dl Body Mass Index (BMI): 36.6 Electronic Signature(s) Signed: 03/14/2020 4:22:48 PM By: Carlene Coria RN Entered By: Carlene Coria on 03/10/2020 12:32:47

## 2020-03-17 ENCOUNTER — Encounter: Payer: Medicare Other | Admitting: Physician Assistant

## 2020-03-17 ENCOUNTER — Other Ambulatory Visit: Payer: Self-pay

## 2020-03-17 DIAGNOSIS — I5042 Chronic combined systolic (congestive) and diastolic (congestive) heart failure: Secondary | ICD-10-CM | POA: Diagnosis not present

## 2020-03-17 DIAGNOSIS — Z87891 Personal history of nicotine dependence: Secondary | ICD-10-CM | POA: Diagnosis not present

## 2020-03-17 DIAGNOSIS — J439 Emphysema, unspecified: Secondary | ICD-10-CM | POA: Diagnosis not present

## 2020-03-17 DIAGNOSIS — I890 Lymphedema, not elsewhere classified: Secondary | ICD-10-CM | POA: Diagnosis not present

## 2020-03-17 DIAGNOSIS — L97929 Non-pressure chronic ulcer of unspecified part of left lower leg with unspecified severity: Secondary | ICD-10-CM | POA: Diagnosis not present

## 2020-03-17 DIAGNOSIS — I872 Venous insufficiency (chronic) (peripheral): Secondary | ICD-10-CM | POA: Diagnosis not present

## 2020-03-17 DIAGNOSIS — L97812 Non-pressure chronic ulcer of other part of right lower leg with fat layer exposed: Secondary | ICD-10-CM | POA: Diagnosis not present

## 2020-03-17 DIAGNOSIS — E11622 Type 2 diabetes mellitus with other skin ulcer: Secondary | ICD-10-CM | POA: Diagnosis not present

## 2020-03-17 NOTE — Progress Notes (Signed)
PANDA, CROSSIN ([id number]) Visit Report for 03/17/2020 Arrival Information Details Patient Name: Alexandria [name] Date of Service: 03/17/2020 11:15 AM Medical Record Number: [id number] Patient Account Number: [id number] Date of Birth/Sex: [birth year] (85 y.o. F) Treating RN: Cornell Barman Primary Care Hurman Ketelsen: Elsie Stain Other Clinician: Referring Marquel Pottenger: Elsie Stain Treating Janaiya Beauchesne/Extender: Skipper Cliche in Treatment: 1 Visit Information History Since Last Visit Added or deleted any medications: No Patient Arrived: Alexandria [name] Any new allergies or adverse reactions: No Arrival Time: 11:03 Had a fall or experienced change in No Accompanied By: self activities of daily living that may affect Transfer Assistance: None risk of falls: Patient Identification Verified: Yes Signs or symptoms of abuse/neglect since last visito No Secondary Verification Process Completed: Yes Hospitalized since last visit: No Patient Requires Transmission-Based Precautions: No Implantable device outside of the clinic excluding No Patient Has Alerts: No cellular tissue based products placed in the center since last visit: Has Dressing in Place as Prescribed: Yes Has Compression in Place as Prescribed: Yes Pain Present Now: No Electronic Signature(s) Signed: 03/17/2020 4:48:50 PM By: Lorine Bears RCP, RRT, CHT Entered By: Lorine Bears on 03/17/2020 11:04:16 Alexandria [name] ([id number]) -------------------------------------------------------------------------------- Compression Therapy Details Patient Name: Alexandria [name] Date of Service: 03/17/2020 11:15 AM Medical Record Number: [id number] Patient Account Number: [id number] Date of Birth/Sex: 1935/09/04 (85 y.o. F) Treating RN: Cornell Barman Primary Care Valdez Brannan: Elsie Stain Other Clinician: Referring Kasiah Manka: Elsie Stain Treating Gardiner Espana/Extender: Jeri Cos Weeks in Treatment: 1 Compression  Therapy Performed for Wound Assessment: Wound #3 Right,Medial Lower Leg Performed By: Clinician Cornell Barman, RN Compression Type: Three Layer Pre Treatment ABI: 1.4 Post Procedure Diagnosis Same as Pre-procedure Notes Patient tolerating wraps well. Electronic Signature(s) Signed: 03/17/2020 11:47:44 AM By: Gretta Cool, BSN, RN, CWS, Kim RN, BSN Entered By: Gretta Cool, BSN, RN, CWS, Kim on 03/17/2020 11:47:44 Alexandria [name] ([id number]) -------------------------------------------------------------------------------- Encounter Discharge Information Details Patient Name: Alexandria [name] Date of Service: 03/17/2020 11:15 AM Medical Record Number: [id number] Patient Account Number: [id number] Date of Birth/Sex: 1936-01-11 (85 y.o. F) Treating RN: Cornell Barman Primary Care Louise Rawson: Elsie Stain Other Clinician: Referring Goble Fudala: Elsie Stain Treating Hansford Hirt/Extender: Skipper Cliche in Treatment: 1 Encounter Discharge Information Items Discharge Condition: Stable Ambulatory Status: Walker Discharge Destination: Home Transportation: Private Auto Accompanied By: self Schedule Follow-up Appointment: Yes Clinical Summary of Care: Electronic Signature(s) Signed: 03/17/2020 11:50:08 AM By: Gretta Cool, BSN, RN, CWS, Kim RN, BSN Entered By: Gretta Cool, BSN, RN, CWS, Kim on 03/17/2020 11:50:08 Alexandria [name] ([id number]) -------------------------------------------------------------------------------- Lower Extremity Assessment Details Patient Name: Alexandria [name] Date of Service: 03/17/2020 11:15 AM Medical Record Number: [id number] Patient Account Number: [id number] Date of Birth/Sex: 08/21/1935 (85 y.o. F) Treating RN: Cornell Barman Primary Care Rondel Episcopo: Elsie Stain Other Clinician: Referring Brenton Joines: Elsie Stain Treating Abner Ardis/Extender: Jeri Cos Weeks in Treatment: 1 Edema Assessment Assessed: [name]: No] [name]: No] [Left: Edema] [Right: :] Calf Left: Right: Point of  Measurement: 32 cm From Medial Instep 41 cm Ankle Left: Right: Point of Measurement: 10 cm From Medial Instep 23.5 cm Knee To Floor Left: Right: From Medial Instep 35 cm Vascular Assessment Pulses: Dorsalis Pedis Palpable: [Right:Yes] Posterior Tibial Palpable: [Right:Yes] Electronic Signature(s) Signed: 03/17/2020 5:43:50 PM By: Gretta Cool, BSN, RN, CWS, Kim RN, BSN Entered By: Gretta Cool, BSN, RN, CWS, Kim on 03/17/2020 11:32:31 Alexandria [name] ([id number]) -------------------------------------------------------------------------------- Multi Wound Chart Details Patient Name: Alexandria [name] Date of Service: 03/17/2020 11:15 AM Medical Record Number: [id number] Patient Account Number: [id number] Date of Birth/Sex: [birth year] (84  y.o. F) Treating RN: Cornell Barman Primary Care Cumi Sanagustin: Elsie Stain Other Clinician: Referring Udell Blasingame: Elsie Stain Treating Laaibah Wartman/Extender: Skipper Cliche in Treatment: 1 Vital Signs Height(in): 65 Pulse(bpm): 21 Weight(lbs): 220 Blood Pressure(mmHg): 151/80 Body Mass Index(BMI): 37 Temperature(F): 97.6 Respiratory Rate(breaths/min): 18 Photos: [3:No Photos] [N/A:N/A] Wound Location: [3:Right, Medial Lower Leg] [N/A:N/A] Wounding Event: [3:Trauma] [N/A:N/A] Primary Etiology: [3:Diabetic Wound/Ulcer of the Lower Extremity] [N/A:N/A] Comorbid History: [3:Anemia, Chronic Obstructive Pulmonary Disease (COPD), Sleep Apnea, Congestive Heart Failure, Hypertension, Type II Diabetes, Neuropathy] [N/A:N/A] Date Acquired: [3:01/31/2020] [N/A:N/A] Weeks of Treatment: [3:1] [N/A:N/A] Wound Status: [3:Open] [N/A:N/A] Measurements L x W x D (cm) [3:1.5x1.1x0.2] [N/A:N/A] Area (cm) : [3:1.296] [N/A:N/A] Volume (cm) : [3:0.259] [N/A:N/A] % Reduction in Area: [3:36.50%] [N/A:N/A] % Reduction in Volume: [3:57.70%] [N/A:N/A] Classification: [3:Grade 2] [N/A:N/A] Exudate Amount: [3:Medium] [N/A:N/A] Exudate Type: [3:Serous] [N/A:N/A] Exudate Color:  [3:amber] [N/A:N/A] Wound Margin: [3:Flat and Intact] [N/A:N/A] Granulation Amount: [3:Medium (34-66%)] [N/A:N/A] Granulation Quality: [3:Pink] [N/A:N/A] Necrotic Amount: [3:Medium (34-66%)] [N/A:N/A] Exposed Structures: [3:Fat Layer (Subcutaneous Tissue): Yes Fascia: No Tendon: No Muscle: No Joint: No Bone: No None] [N/A:N/A N/A] Treatment Notes Electronic Signature(s) Signed: 03/17/2020 5:43:50 PM By: Gretta Cool, BSN, RN, CWS, Kim RN, BSN Entered By: Gretta Cool, BSN, RN, CWS, Kim on 03/17/2020 11:34:21 Alexandria [name] ([id number]) -------------------------------------------------------------------------------- [place] Details Patient Name: Alexandria [name] Date of Service: 03/17/2020 11:15 AM Medical Record Number: [id number] Patient Account Number: [id number] Date of Birth/Sex: September 15, 1935 (85 y.o. F) Treating RN: Cornell Barman Primary Care Rheta Hemmelgarn: Elsie Stain Other Clinician: Referring Ayiana Winslett: Elsie Stain Treating Gevork Ayyad/Extender: Skipper Cliche in Treatment: 1 Active Inactive Wound/Skin Impairment Nursing Diagnoses: Knowledge deficit related to ulceration/compromised skin integrity Goals: Patient/caregiver will verbalize understanding of skin care regimen Date Initiated: 03/10/2020 Target Resolution Date: 04/10/2020 Goal Status: Active Ulcer/skin breakdown will have a volume reduction of 30% by week 4 Date Initiated: 03/10/2020 Target Resolution Date: 04/10/2020 Goal Status: Active Interventions: Assess patient/caregiver ability to obtain necessary supplies Assess patient/caregiver ability to perform ulcer/skin care regimen upon admission and as needed Assess ulceration(s) every visit Notes: Electronic Signature(s) Signed: 03/17/2020 5:43:50 PM By: Gretta Cool, BSN, RN, CWS, Kim RN, BSN Entered By: Gretta Cool, BSN, RN, CWS, Kim on 03/17/2020 11:34:12 Alexandria [name] ([id number]) -------------------------------------------------------------------------------- Pain  Assessment Details Patient Name: Alexandria [name] Date of Service: 03/17/2020 11:15 AM Medical Record Number: [id number] Patient Account Number: [id number] Date of Birth/Sex: Jul 20, 1935 (85 y.o. F) Treating RN: Cornell Barman Primary Care Adell Koval: Elsie Stain Other Clinician: Referring Amethyst Gainer: Elsie Stain Treating Ricarda Atayde/Extender: Skipper Cliche in Treatment: 1 Active Problems Location of Pain Severity and Description of Pain Patient Has Paino No Site Locations Pain Management and Medication Current Pain Management: Notes Patient denies pain at this time. Electronic Signature(s) Signed: 03/17/2020 5:43:50 PM By: Gretta Cool, BSN, RN, CWS, Kim RN, BSN Entered By: Gretta Cool, BSN, RN, CWS, Kim on 03/17/2020 11:25:53 Alexandria [name] ([id number]) -------------------------------------------------------------------------------- Patient/Caregiver Education Details Patient Name: Alexandria [name] Date of Service: 03/17/2020 11:15 AM Medical Record Number: [id number] Patient Account Number: [id number] Date of Birth/Gender: [birth year] (85 y.o. F) Treating RN: Cornell Barman Primary Care Physician: Elsie Stain Other Clinician: Referring Physician: Elsie Stain Treating Physician/Extender: Skipper Cliche in Treatment: 1 Education Assessment Education Provided To: Patient Education Topics Provided Venous: Handouts: Controlling Swelling with Multilayered Compression Wraps Methods: Demonstration, Explain/Verbal Responses: State content correctly Wound/Skin Impairment: Handouts: Caring for Your Ulcer Methods: Demonstration, Explain/Verbal Responses: State content correctly Electronic Signature(s) Signed: 03/17/2020 5:43:50 PM By: Gretta Cool, BSN, RN, CWS, Kim RN, BSN Entered By:  Gretta Cool, BSN, RN, CWS, Kim on 03/17/2020 11:48:34 Alexandria [name] ([id number]) -------------------------------------------------------------------------------- Wound Assessment Details Patient Name: ESTELLAR, CADENA Date of Service: 03/17/2020 11:15 AM Medical Record Number: [id number] Patient Account Number: [id number] Date of Birth/Sex: May 12, 1935 (85 y.o. F) Treating RN: Cornell Barman Primary Care Krithi Bray: Elsie Stain Other Clinician: Referring Zeb Rawl: Elsie Stain Treating Daschel Roughton/Extender: Jeri Cos Weeks in Treatment: 1 Wound Status Wound Number: 3 Primary Diabetic Wound/Ulcer of the Lower Extremity Etiology: Wound Location: Right, Medial Lower Leg Wound Open Wounding Event: Trauma Status: Date Acquired: 01/31/2020 Comorbid Anemia, Chronic Obstructive Pulmonary Disease (COPD), Weeks Of Treatment: 1 History: Sleep Apnea, Congestive Heart Failure, Hypertension, Clustered Wound: No Type II Diabetes, Neuropathy Wound Measurements Length: (cm) 1.5 Width: (cm) 1.1 Depth: (cm) 0.2 Area: (cm) 1.296 Volume: (cm) 0.259 % Reduction in Area: 36.5% % Reduction in Volume: 57.7% Epithelialization: None Tunneling: No Undermining: No Wound Description Classification: Grade 2 Wound Margin: Flat and Intact Exudate Amount: Medium Exudate Type: Serous Exudate Color: amber Foul Odor After Cleansing: No Slough/Fibrino Yes Wound Bed Granulation Amount: Medium (34-66%) Exposed Structure Granulation Quality: Pink Fascia Exposed: No Necrotic Amount: Medium (34-66%) Fat Layer (Subcutaneous Tissue) Exposed: Yes Necrotic Quality: Adherent Slough Tendon Exposed: No Muscle Exposed: No Joint Exposed: No Bone Exposed: No Treatment Notes Wound #3 (Lower Leg) Wound Laterality: Right, Medial Cleanser Peri-Wound Care Topical Primary Dressing Prisma 4.34 (in) Quantity: 1 Discharge Instruction: Moisten w/normal saline or sterile water; Cover wound as directed. Do not remove from wound bed. Secondary Dressing ABD Pad 5x9 (in/in) Quantity: 1 Discharge Instruction: Cover with ABD pad Secured With Compression Wrap Profore Lite LF 3 Multilayer Compression Bandaging System Quantity:  1 Zachery, Vernon ([id number]) Discharge Instruction: Apply 3 multi-layer wrap as prescribed. Compression Stockings Environmental education officer) Signed: 03/17/2020 5:43:50 PM By: Gretta Cool, BSN, RN, CWS, Kim RN, BSN Entered By: Gretta Cool, BSN, RN, CWS, Kim on 03/17/2020 11:30:26 Alexandria [name] ([id number]) -------------------------------------------------------------------------------- [place] Details Patient Name: Alexandria [name] Date of Service: 03/17/2020 11:15 AM Medical Record Number: [id number] Patient Account Number: [id number] Date of Birth/Sex: May 20, 1935 (85 y.o. F) Treating RN: Cornell Barman Primary Care Jacori Mulrooney: Elsie Stain Other Clinician: Referring Chao Blazejewski: Elsie Stain Treating Lavetta Geier/Extender: Skipper Cliche in Treatment: 1 Vital Signs Time Taken: 11:05 Temperature (F): 97.6 Height (in): 65 Pulse (bpm): 75 Weight (lbs): 220 Respiratory Rate (breaths/min): 18 Body Mass Index (BMI): 36.6 Blood Pressure (mmHg): 151/80 Reference Range: 80 - 120 mg / dl Electronic Signature(s) Signed: 03/17/2020 4:48:50 PM By: Lorine Bears RCP, RRT, CHT Entered By: Lorine Bears on 03/17/2020 11:09:52

## 2020-03-17 NOTE — Progress Notes (Addendum)
Alexandria [name], Alexandria [name] ([id number]) Visit Report for 03/17/2020 Chief Complaint Document Details Patient Name: Alexandria [name], Alexandria [name] Date of Service: 03/17/2020 11:15 AM Medical Record Number: [id number] Patient Account Number: [id number] Date of Birth/Sex: [birth year] (85 y.o. F) Treating RN: Cornell Barman Primary Care Provider: Elsie Stain Other Clinician: Referring Provider: Elsie Stain Treating Provider/Extender: Skipper Cliche in Treatment: 1 Information Obtained from: Patient Chief Complaint Right LE Ulcer Electronic Signature(s) Signed: 03/17/2020 11:29:58 AM By: Worthy Keeler PA-C Entered By: Worthy Keeler on 03/17/2020 11:29:57 Alexandria [name] ([id number]) -------------------------------------------------------------------------------- HPI Details Patient Name: Alexandria [name] Date of Service: 03/17/2020 11:15 AM Medical Record Number: [id number] Patient Account Number: [id number] Date of Birth/Sex: 08-Jul-1935 (85 y.o. F) Treating RN: Cornell Barman Primary Care Provider: Elsie Stain Other Clinician: Referring Provider: Elsie Stain Treating Provider/Extender: Skipper Cliche in Treatment: 1 History of Present Illness HPI Description: 85 year old diabetic patient who recently was seen in the ER for a knee laceration which was closed with staples and then Steri-Stripped. She then developed a cellulitis on her right lower extremity and had to be treated with Septra and was previously on Keflex. She was asked to use a leg immobilizer and limited range of motion to 45o at the knee. The patient is known to have a past medical history of depression, hypothyroidism, hypertension, diastolic CHF, hyperlipidemia, diabetes mellitus, peripheral neuropathy. she is being treated by her pulmonologist for COPD with emphysema with excarcebation and was given a course of prednisone recently. She also was asked to use a CPAP machine and other symptomatic treatment was given. Last hemoglobin  A1c was 5.8% 2 months ago. she used to be a heavy smoker but has quit about 11 years ago. 11/04/2016 -- the patient had a lower extremity venous duplex reflux evaluation done on 11/03/2016 and this showed no evidence of acute bilateral lower extremity deep vein thrombosis. She had reflux in bilateral common femoral veins and there was reflux in the bilateral saphenofemoral junction. However they were unable to obtain reflux in the bilateral great saphenous veins of the small saphenous veins and it was a difficult examination technically. [place] wanted some advice about wound management while they were to be 11/12/2016 -- she returns after a vacation but has a new injury to her right lateral calf which she lacerated on a blunt object 11/25/2016 -- the patient still has not gotten in touch with her cardiologist regarding her diuretics and she continues to have significant lymphedema right lower extremity was then left Readmission: 03/10/2020 upon evaluation today patient appears to be doing somewhat poorly in regard to her right lower extremity as a result of having dropped a 40 ounce jar of peanut butter on her leg about a month ago causing a traumatic injury. She does have significant swelling and even some very mild erythema she has been on Keflex for urinary tract infection and tells me that really the wound has improved significantly during the time she is been on the Keflex. Fortunately there is no signs of active infection at this time. No fevers, chills, nausea, vomiting, or diarrhea. The patient does have a history of chronic venous insufficiency, lymphedema, diabetes mellitus type 2, congestive heart failure, and COPD. 03/17/2020 upon evaluation today patient appears to be doing well in regard to her wound currently. Fortunately there is no sign of active infection at this time. I do believe that the leg ulcer is making good progress. This is still  a  wound that I think would benefit from continued wrapping she really did want to try to discontinue the wrapping if at all possible but again I feel like that would be ideal. I did leave it open to discussion and in the end that the patient decided she would like to do what is best to try to get this healed as quickly as possible. Therefore we can continue to wrap the leg at this time she is in agreement with that plan. Electronic Signature(s) Signed: 03/17/2020 5:40:00 PM By: Worthy Keeler PA-C Entered By: Worthy Keeler on 03/17/2020 17:39:59 Alexandria [name] ([id number]) -------------------------------------------------------------------------------- Physical Exam Details Patient Name: Alexandria [name] Date of Service: 03/17/2020 11:15 AM Medical Record Number: [id number] Patient Account Number: [id number] Date of Birth/Sex: 1935-07-25 (85 y.o. F) Treating RN: Cornell Barman Primary Care Provider: Elsie Stain Other Clinician: Referring Provider: Elsie Stain Treating Provider/Extender: Jeri Cos Weeks in Treatment: 1 Constitutional Well-nourished and well-hydrated in no acute distress. Respiratory normal breathing without difficulty. Psychiatric this patient is able to make decisions and demonstrates good insight into disease process. Alert and Oriented x 3. pleasant and cooperative. Notes On inspection patient's wound bed actually showed signs of good granulation minimal slough was noted on the surface of the wound I did not even have to perform sharp debridement today which was great news. That is an improvement in and of itself. Overall I think she is doing excellent I do believe the compression wrap has been integral to this. I do think we should continue the patient is in agreement with doing so. Electronic Signature(s) Signed: 03/17/2020 5:40:22 PM By: Worthy Keeler PA-C Entered By: Worthy Keeler on 03/17/2020 17:40:22 Alexandria [name]  ([id number]) -------------------------------------------------------------------------------- Physician Orders Details Patient Name: Alexandria [name] Date of Service: 03/17/2020 11:15 AM Medical Record Number: [id number] Patient Account Number: [id number] Date of Birth/Sex: 07-14-1935 (84 y.o. F) Treating RN: Cornell Barman Primary Care Provider: Elsie Stain Other Clinician: Referring Provider: Elsie Stain Treating Provider/Extender: Skipper Cliche in Treatment: 1 Verbal / Phone Orders: No Diagnosis Coding ICD-10 Coding Code Description I87.2 Venous insufficiency (chronic) (peripheral) I89.0 Lymphedema, not elsewhere classified L97.812 Non-pressure chronic ulcer of other part of right lower leg with fat layer exposed E11.622 Type 2 diabetes mellitus with other skin ulcer I50.42 Chronic combined systolic (congestive) and diastolic (congestive) heart failure J44.9 Chronic obstructive pulmonary disease, unspecified Follow-up Appointments o Return Appointment in 1 week. Edema Control - Lymphedema / Segmental Compressive Device / Other Right Lower Extremity o Elevate, Exercise Daily and Avoid Standing for Long Periods of Time. o Elevate legs to the level of the heart and pump ankles as often as possible o Elevate leg(s) parallel to the floor when sitting. Wound Treatment Wound #3 - Lower Leg Wound Laterality: Right, Medial Primary Dressing: Prisma 4.34 (in) 1 x Per Week Discharge Instructions: Moisten w/normal saline or sterile water; Cover wound as directed. Do not remove from wound bed. Secondary Dressing: ABD Pad 5x9 (in/in) 1 x Per Week Discharge Instructions: Cover with ABD pad Compression Wrap: Profore Lite LF 3 Multilayer Compression Bandaging System 1 x Per Week Discharge Instructions: Apply 3 multi-layer wrap as prescribed. Electronic Signature(s) Signed: 03/17/2020 5:43:50 PM By: Gretta Cool, BSN, RN, CWS, Kim RN, BSN Signed: 03/17/2020 5:45:21 PM By: Worthy Keeler  PA-C Entered By: Gretta Cool BSN, RN, CWS, Kim on 03/17/2020 11:36:35 Alexandria [name] ([id number]) -------------------------------------------------------------------------------- Problem List Details Patient Name: Alexandria [name] Date of Service: 03/17/2020 11:15 AM Medical Record  Number: [id number] Patient Account Number: [id number] Date of Birth/Sex: 18-Jun-1935 (84 y.o. F) Treating RN: Cornell Barman Primary Care Provider: Elsie Stain Other Clinician: Referring Provider: Elsie Stain Treating Provider/Extender: Skipper Cliche in Treatment: 1 Active Problems ICD-10 Encounter Code Description Active Date MDM Diagnosis I87.2 Venous insufficiency (chronic) (peripheral) 03/10/2020 No Yes I89.0 Lymphedema, not elsewhere classified 03/10/2020 No Yes L97.812 Non-pressure chronic ulcer of other part of right lower leg with fat layer 03/10/2020 No Yes exposed E11.622 Type 2 diabetes mellitus with other skin ulcer 03/10/2020 No Yes I50.42 Chronic combined systolic (congestive) and diastolic (congestive) heart 03/10/2020 No Yes failure J44.9 Chronic obstructive pulmonary disease, unspecified 03/10/2020 No Yes Inactive Problems Resolved Problems Electronic Signature(s) Signed: 03/17/2020 11:15:15 AM By: Worthy Keeler PA-C Entered By: Worthy Keeler on 03/17/2020 11:15:15 Alexandria [name] ([id number]) -------------------------------------------------------------------------------- Progress Note Details Patient Name: Alexandria [name] Date of Service: 03/17/2020 11:15 AM Medical Record Number: [id number] Patient Account Number: [id number] Date of Birth/Sex: May 20, 1935 (84 y.o. F) Treating RN: Cornell Barman Primary Care Provider: Elsie Stain Other Clinician: Referring Provider: Elsie Stain Treating Provider/Extender: Skipper Cliche in Treatment: 1 Subjective Chief Complaint Information obtained from Patient Right LE Ulcer History of Present Illness (HPI) 85 year old diabetic patient who  recently was seen in the ER for a knee laceration which was closed with staples and then Steri-Stripped. She then developed a cellulitis on her right lower extremity and had to be treated with Septra and was previously on Keflex. She was asked to use a leg immobilizer and limited range of motion to 45 at the knee. The patient is known to have a past medical history of depression, hypothyroidism, hypertension, diastolic CHF, hyperlipidemia, diabetes mellitus, peripheral neuropathy. she is being treated by her pulmonologist for COPD with emphysema with excarcebation and was given a course of prednisone recently. She also was asked to use a CPAP machine and other symptomatic treatment was given. Last hemoglobin A1c was 5.8% 2 months ago. she used to be a heavy smoker but has quit about 11 years ago. 11/04/2016 -- the patient had a lower extremity venous duplex reflux evaluation done on 11/03/2016 and this showed no evidence of acute bilateral lower extremity deep vein thrombosis. She had reflux in bilateral common femoral veins and there was reflux in the bilateral saphenofemoral junction. However they were unable to obtain reflux in the bilateral great saphenous veins of the small saphenous veins and it was a difficult examination technically. [place] and wanted some advice about wound management while they were to be 11/12/2016 -- she returns after a vacation but has a new injury to her right lateral calf which she lacerated on a blunt object 11/25/2016 -- the patient still has not gotten in touch with her cardiologist regarding her diuretics and she continues to have significant lymphedema right lower extremity was then left Readmission: 03/10/2020 upon evaluation today patient appears to be doing somewhat poorly in regard to her right lower extremity as a result of having dropped a 40 ounce jar of peanut butter on her leg about a month ago causing a  traumatic injury. She does have significant swelling and even some very mild erythema she has been on Keflex for urinary tract infection and tells me that really the wound has improved significantly during the time she is been on the Keflex. Fortunately there is no signs of active infection at this time. No fevers, chills, nausea, vomiting, or diarrhea. The patient  does have a history of chronic venous insufficiency, lymphedema, diabetes mellitus type 2, congestive heart failure, and COPD. 03/17/2020 upon evaluation today patient appears to be doing well in regard to her wound currently. Fortunately there is no sign of active infection at this time. I do believe that the leg ulcer is making good progress. This is still a wound that I think would benefit from continued wrapping she really did want to try to discontinue the wrapping if at all possible but again I feel like that would be ideal. I did leave it open to discussion and in the end that the patient decided she would like to do what is best to try to get this healed as quickly as possible. Therefore we can continue to wrap the leg at this time she is in agreement with that plan. Objective Constitutional Well-nourished and well-hydrated in no acute distress. Vitals Time Taken: 11:05 AM, Height: 65 in, Weight: 220 lbs, BMI: 36.6, Temperature: 97.6 F, Pulse: 75 bpm, Respiratory Rate: 18 breaths/min, Blood Pressure: 151/80 mmHg. Respiratory normal breathing without difficulty. Psychiatric this patient is able to make decisions and demonstrates good insight into disease process. Alert and Oriented x 3. pleasant and cooperative. General Notes: On inspection patient's wound bed actually showed signs of good granulation minimal slough was noted on the surface of the [place], Tyrrell. ([id number]) wound I did not even have to perform sharp debridement today which was great news. That is an improvement in and of itself. Overall I think she is doing  excellent I do believe the compression wrap has been integral to this. I do think we should continue the patient is in agreement with doing so. Integumentary (Hair, Skin) Wound #3 status is Open. Original cause of wound was Trauma. The wound is located on the Right,Medial Lower Leg. The wound measures 1.5cm length x 1.1cm width x 0.2cm depth; 1.296cm^2 area and 0.259cm^3 volume. There is Fat Layer (Subcutaneous Tissue) exposed. There is no tunneling or undermining noted. There is a medium amount of serous drainage noted. The wound margin is flat and intact. There is medium (34- 66%) pink granulation within the wound bed. There is a medium (34-66%) amount of necrotic tissue within the wound bed including Adherent Slough. Assessment Active Problems ICD-10 Venous insufficiency (chronic) (peripheral) Lymphedema, not elsewhere classified Non-pressure chronic ulcer of other part of right lower leg with fat layer exposed Type 2 diabetes mellitus with other skin ulcer Chronic combined systolic (congestive) and diastolic (congestive) heart failure Chronic obstructive pulmonary disease, unspecified Procedures Wound #3 Pre-procedure diagnosis of Wound #3 is a Diabetic Wound/Ulcer of the Lower Extremity located on the Right,Medial Lower Leg . There was a Three Layer Compression Therapy Procedure with a pre-treatment ABI of 1.4 by Cornell Barman, RN. Post procedure Diagnosis Wound #3: Same as Pre-Procedure Notes: Patient tolerating wraps well.. Plan Follow-up Appointments: Return Appointment in 1 week. Edema Control - Lymphedema / Segmental Compressive Device / Other: Elevate, Exercise Daily and Avoid Standing for Long Periods of Time. Elevate legs to the level of the heart and pump ankles as often as possible Elevate leg(s) parallel to the floor when sitting. WOUND #3: - Lower Leg Wound Laterality: Right, Medial Primary Dressing: Prisma 4.34 (in) 1 x Per Week/ Discharge Instructions: Moisten  w/normal saline or sterile water; Cover wound as directed. Do not remove from wound bed. Secondary Dressing: ABD Pad 5x9 (in/in) 1 x Per Week/ Discharge Instructions: Cover with ABD pad Compression Wrap: Profore Lite LF 3 Multilayer Compression  Bandaging System 1 x Per Week/ Discharge Instructions: Apply 3 multi-layer wrap as prescribed. 1. Would recommend that we going continue with the wound care measures as before and the patient is in agreement the plan. This includes the use of the collagen dressing which I feel like has been beneficial. 2. I am also can recommend currently that we have the patient continue with ABD pad to cover followed by 3 layer compression wrap. We will see patient back for reevaluation in 1 week here in the clinic. If anything worsens or changes patient will contact our office for additional recommendations. Electronic Signature(s) Signed: 03/17/2020 5:40:50 PM By: Worthy Keeler PA-C Entered By: Worthy Keeler on 03/17/2020 17:40:49 ESTERLENE, ATIYEH ([name] ([id number]) -------------------------------------------------------------------------------- SuperBill Details Patient Name: Alexandria [name] Date of Service: 03/17/2020 Medical Record Number: [id number] Patient Account Number: [id number] Date of Birth/Sex: 12/07/1935 (84 y.o. F) Treating RN: Cornell Barman Primary Care Provider: Elsie Stain Other Clinician: Referring Provider: Elsie Stain Treating Provider/Extender: Jeri Cos Weeks in Treatment: 1 Diagnosis Coding ICD-10 Codes Code Description I87.2 Venous insufficiency (chronic) (peripheral) I89.0 Lymphedema, not elsewhere classified L97.812 Non-pressure chronic ulcer of other part of right lower leg with fat layer exposed E11.622 Type 2 diabetes mellitus with other skin ulcer I50.42 Chronic combined systolic (congestive) and diastolic (congestive) heart failure J44.9 Chronic obstructive pulmonary disease,  unspecified Facility Procedures CPT4 Code: 08657846 Description: (Facility Use Only) [facility] L[facility] RT LEG Modifier: Quantity: 1 Physician Procedures CPT4 Code: 9629528 Description: 41324 - WC PHYS LEVEL 3 - EST PT Modifier: Quantity: 1 CPT4 Code: Description: ICD-10 Diagnosis Description I87.2 Venous insufficiency (chronic) (peripheral) I89.0 Lymphedema, not elsewhere classified L97.812 Non-pressure chronic ulcer of other part of right lower leg with fat la E11.622 Type 2 diabetes mellitus with  other skin ulcer Modifier: yer exposed Quantity: Electronic Signature(s) Signed: 03/17/2020 5:41:03 PM By: Worthy Keeler PA-C Previous Signature: 03/17/2020 11:48:09 AM Version By: Gretta Cool, BSN, RN, CWS, Kim RN, BSN Entered By: Worthy Keeler on 03/17/2020 17:41:02

## 2020-03-25 ENCOUNTER — Encounter: Payer: Medicare Other | Admitting: Physician Assistant

## 2020-03-25 DIAGNOSIS — L97929 Non-pressure chronic ulcer of unspecified part of left lower leg with unspecified severity: Secondary | ICD-10-CM | POA: Diagnosis not present

## 2020-03-25 DIAGNOSIS — I5042 Chronic combined systolic (congestive) and diastolic (congestive) heart failure: Secondary | ICD-10-CM | POA: Diagnosis not present

## 2020-03-25 DIAGNOSIS — J439 Emphysema, unspecified: Secondary | ICD-10-CM | POA: Diagnosis not present

## 2020-03-25 DIAGNOSIS — I890 Lymphedema, not elsewhere classified: Secondary | ICD-10-CM | POA: Diagnosis not present

## 2020-03-25 DIAGNOSIS — Z87891 Personal history of nicotine dependence: Secondary | ICD-10-CM | POA: Diagnosis not present

## 2020-03-25 DIAGNOSIS — L97812 Non-pressure chronic ulcer of other part of right lower leg with fat layer exposed: Secondary | ICD-10-CM | POA: Diagnosis not present

## 2020-03-25 DIAGNOSIS — E11622 Type 2 diabetes mellitus with other skin ulcer: Secondary | ICD-10-CM | POA: Diagnosis not present

## 2020-03-25 DIAGNOSIS — I872 Venous insufficiency (chronic) (peripheral): Secondary | ICD-10-CM | POA: Diagnosis not present

## 2020-03-25 NOTE — Progress Notes (Addendum)
LILYROSE, TANNEY ([id number]) Visit Report for 03/25/2020 Chief Complaint Document Details Patient Name: Alexandria [name], Alexandria [name] Date of Service: 03/25/2020 2:30 PM Medical Record Number: [id number] Patient Account Number: [id number] Date of Birth/Sex: 1935/05/23 (85 y.o. F) Treating RN: Cornell Barman Primary Care Provider: Elsie Stain Other Clinician: Referring Provider: Elsie Stain Treating Provider/Extender: Skipper Cliche in Treatment: 2 Information Obtained from: Patient Chief Complaint Right LE Ulcer Electronic Signature(s) Signed: 03/25/2020 3:57:17 PM By: Worthy Keeler PA-C Entered By: Worthy Keeler on 03/25/2020 15:57:16 Alexandria [name] ([id number]) -------------------------------------------------------------------------------- HPI Details Patient Name: Alexandria [name] Date of Service: 03/25/2020 2:30 PM Medical Record Number: [id number] Patient Account Number: [id number] Date of Birth/Sex: 1935-06-19 (84 y.o. F) Treating RN: Cornell Barman Primary Care Provider: Elsie Stain Other Clinician: Referring Provider: Elsie Stain Treating Provider/Extender: Skipper Cliche in Treatment: 2 History of Present Illness HPI Description: 85 year old diabetic patient who recently was seen in the ER for a knee laceration which was closed with staples and then Steri-Stripped. She then developed a cellulitis on her right lower extremity and had to be treated with Septra and was previously on Keflex. She was asked to use a leg immobilizer and limited range of motion to 45o at the knee. The patient is known to have a past medical history of depression, hypothyroidism, hypertension, diastolic CHF, hyperlipidemia, diabetes mellitus, peripheral neuropathy. she is being treated by her pulmonologist for COPD with emphysema with excarcebation and was given a course of prednisone recently. She also was asked to use a CPAP machine and other symptomatic treatment was given. Last hemoglobin A1c  was 5.8% 2 months ago. she used to be a heavy smoker but has quit about 11 years ago. 11/04/2016 -- the patient had a lower extremity venous duplex reflux evaluation done on 11/03/2016 and this showed no evidence of acute bilateral lower extremity deep vein thrombosis. She had reflux in bilateral common femoral veins and there was reflux in the bilateral saphenofemoral junction. However they were unable to obtain reflux in the bilateral great saphenous veins of the small saphenous veins and it was a difficult examination technically. [place] wanted some advice about wound management while they were to be 11/12/2016 -- she returns after a vacation but has a new injury to her right lateral calf which she lacerated on a blunt object 11/25/2016 -- the patient still has not gotten in touch with her cardiologist regarding her diuretics and she continues to have significant lymphedema right lower extremity was then left Readmission: 03/10/2020 upon evaluation today patient appears to be doing somewhat poorly in regard to her right lower extremity as a result of having dropped a 40 ounce jar of peanut butter on her leg about a month ago causing a traumatic injury. She does have significant swelling and even some very mild erythema she has been on Keflex for urinary tract infection and tells me that really the wound has improved significantly during the time she is been on the Keflex. Fortunately there is no signs of active infection at this time. No fevers, chills, nausea, vomiting, or diarrhea. The patient does have a history of chronic venous insufficiency, lymphedema, diabetes mellitus type 2, congestive heart failure, and COPD. 03/17/2020 upon evaluation today patient appears to be doing well in regard to her wound currently. Fortunately there is no sign of active infection at this time. I do believe that the leg ulcer is making good progress. This is still  a  wound that I think would benefit from continued wrapping she really did want to try to discontinue the wrapping if at all possible but again I feel like that would be ideal. I did leave it open to discussion and in the end that the patient decided she would like to do what is best to try to get this healed as quickly as possible. Therefore we can continue to wrap the leg at this time she is in agreement with that plan. 03/25/2020 upon evaluation today patient appears to be doing well at this point in regard to her wound. In fact this is measuring significantly smaller leading to skin around the edges. There does not appear to be any signs of active infection which is great news and overall I am extremely pleased with where things stand at this time. No fevers, chills, nausea, vomiting, or diarrhea. Electronic Signature(s) Signed: 03/25/2020 4:16:09 PM By: Worthy Keeler PA-C Entered By: Worthy Keeler on 03/25/2020 16:16:08 Alexandria [name] ([id number]) -------------------------------------------------------------------------------- Physical Exam Details Patient Name: Alexandria [name] Date of Service: 03/25/2020 2:30 PM Medical Record Number: [id number] Patient Account Number: [id number] Date of Birth/Sex: February 05, 1936 (84 y.o. F) Treating RN: Cornell Barman Primary Care Provider: Elsie Stain Other Clinician: Referring Provider: Elsie Stain Treating Provider/Extender: Jeri Cos Weeks in Treatment: 2 Constitutional Obese and well-hydrated in no acute distress. Respiratory normal breathing without difficulty. Psychiatric this patient is able to make decisions and demonstrates good insight into disease process. Alert and Oriented x 3. pleasant and cooperative. Notes Patient did have a little bit of rubbing at the top of her wrap but to be honest I think it otherwise looks like is doing extremely well. I did actually go ahead and recommend that we continue to 3 layer but we are going to put  a piece of foam behind that area to try to pattern a little. She is in agreement with that plan. Electronic Signature(s) Signed: 03/25/2020 4:17:56 PM By: Worthy Keeler PA-C Entered By: Worthy Keeler on 03/25/2020 16:17:56 Alexandria [name] ([id number]) -------------------------------------------------------------------------------- Physician Orders Details Patient Name: Alexandria [name] Date of Service: 03/25/2020 2:30 PM Medical Record Number: [id number] Patient Account Number: [id number] Date of Birth/Sex: April 07, 1935 (84 y.o. F) Treating RN: Carlene Coria Primary Care Provider: Elsie Stain Other Clinician: Referring Provider: Elsie Stain Treating Provider/Extender: Skipper Cliche in Treatment: 2 Verbal / Phone Orders: No Diagnosis Coding ICD-10 Coding Code Description I87.2 Venous insufficiency (chronic) (peripheral) I89.0 Lymphedema, not elsewhere classified L97.812 Non-pressure chronic ulcer of other part of right lower leg with fat layer exposed E11.622 Type 2 diabetes mellitus with other skin ulcer I50.42 Chronic combined systolic (congestive) and diastolic (congestive) heart failure J44.9 Chronic obstructive pulmonary disease, unspecified Follow-up Appointments o Return Appointment in 1 week. Edema Control - Lymphedema / Segmental Compressive Device / Other Right Lower Extremity o Elevate, Exercise Daily and Avoid Standing for Long Periods of Time. o Elevate legs to the level of the heart and pump ankles as often as possible o Elevate leg(s) parallel to the floor when sitting. Wound Treatment Wound #3 - Lower Leg Wound Laterality: Right, Medial Primary Dressing: Prisma 4.34 (in) 1 x Per Week Discharge Instructions: Moisten w/normal saline or sterile water; Cover wound as directed. Do not remove from wound bed. Secondary Dressing: ABD Pad 5x9 (in/in) 1 x Per Week Discharge Instructions: Cover with ABD pad Compression Wrap: Profore Lite LF 3 Multilayer  Compression Bandaging System 1 x Per Week Discharge Instructions: Apply 3  multi-layer wrap as prescribed. Electronic Signature(s) Signed: 03/25/2020 5:06:45 PM By: Worthy Keeler PA-C Signed: 03/28/2020 10:25:07 AM By: Carlene Coria RN Entered By: Carlene Coria on 03/25/2020 16:13:30 Alexandria [name] ([id number]) -------------------------------------------------------------------------------- Problem List Details Patient Name: Alexandria [name] Date of Service: 03/25/2020 2:30 PM Medical Record Number: [id number] Patient Account Number: [id number] Date of Birth/Sex: April 28, 1935 (84 y.o. F) Treating RN: Cornell Barman Primary Care Provider: Elsie Stain Other Clinician: Referring Provider: Elsie Stain Treating Provider/Extender: Skipper Cliche in Treatment: 2 Active Problems ICD-10 Encounter Code Description Active Date MDM Diagnosis I87.2 Venous insufficiency (chronic) (peripheral) 03/10/2020 No Yes I89.0 Lymphedema, not elsewhere classified 03/10/2020 No Yes L97.812 Non-pressure chronic ulcer of other part of right lower leg with fat layer 03/10/2020 No Yes exposed E11.622 Type 2 diabetes mellitus with other skin ulcer 03/10/2020 No Yes I50.42 Chronic combined systolic (congestive) and diastolic (congestive) heart 03/10/2020 No Yes failure J44.9 Chronic obstructive pulmonary disease, unspecified 03/10/2020 No Yes Inactive Problems Resolved Problems Electronic Signature(s) Signed: 03/25/2020 3:57:09 PM By: Worthy Keeler PA-C Entered By: Worthy Keeler on 03/25/2020 15:57:09 Alexandria [name] ([id number]) -------------------------------------------------------------------------------- Progress Note Details Patient Name: Alexandria [name] Date of Service: 03/25/2020 2:30 PM Medical Record Number: [id number] Patient Account Number: [id number] Date of Birth/Sex: 1935/07/18 (84 y.o. F) Treating RN: Cornell Barman Primary Care Provider: Elsie Stain Other Clinician: Referring Provider: Elsie Stain Treating Provider/Extender: Skipper Cliche in Treatment: 2 Subjective Chief Complaint Information obtained from Patient Right LE Ulcer History of Present Illness (HPI) 85 year old diabetic patient who recently was seen in the ER for a knee laceration which was closed with staples and then Steri-Stripped. She then developed a cellulitis on her right lower extremity and had to be treated with Septra and was previously on Keflex. She was asked to use a leg immobilizer and limited range of motion to 45 at the knee. The patient is known to have a past medical history of depression, hypothyroidism, hypertension, diastolic CHF, hyperlipidemia, diabetes mellitus, peripheral neuropathy. she is being treated by her pulmonologist for COPD with emphysema with excarcebation and was given a course of prednisone recently. She also was asked to use a CPAP machine and other symptomatic treatment was given. Last hemoglobin A1c was 5.8% 2 months ago. she used to be a heavy smoker but has quit about 11 years ago. 11/04/2016 -- the patient had a lower extremity venous duplex reflux evaluation done on 11/03/2016 and this showed no evidence of acute bilateral lower extremity deep vein thrombosis. She had reflux in bilateral common femoral veins and there was reflux in the bilateral saphenofemoral junction. However they were unable to obtain reflux in the bilateral great saphenous veins of the small saphenous veins and it was a difficult examination technically. [place] and wanted some advice about wound management while they were to be 11/12/2016 -- she returns after a vacation but has a new injury to her right lateral calf which she lacerated on a blunt object 11/25/2016 -- the patient still has not gotten in touch with her cardiologist regarding her diuretics and she continues to have significant lymphedema right lower extremity was then  left Readmission: 03/10/2020 upon evaluation today patient appears to be doing somewhat poorly in regard to her right lower extremity as a result of having dropped a 40 ounce jar of peanut butter on her leg about a month ago causing a traumatic injury. She does have significant swelling and even some  very mild erythema she has been on Keflex for urinary tract infection and tells me that really the wound has improved significantly during the time she is been on the Keflex. Fortunately there is no signs of active infection at this time. No fevers, chills, nausea, vomiting, or diarrhea. The patient does have a history of chronic venous insufficiency, lymphedema, diabetes mellitus type 2, congestive heart failure, and COPD. 03/17/2020 upon evaluation today patient appears to be doing well in regard to her wound currently. Fortunately there is no sign of active infection at this time. I do believe that the leg ulcer is making good progress. This is still a wound that I think would benefit from continued wrapping she really did want to try to discontinue the wrapping if at all possible but again I feel like that would be ideal. I did leave it open to discussion and in the end that the patient decided she would like to do what is best to try to get this healed as quickly as possible. Therefore we can continue to wrap the leg at this time she is in agreement with that plan. 03/25/2020 upon evaluation today patient appears to be doing well at this point in regard to her wound. In fact this is measuring significantly smaller leading to skin around the edges. There does not appear to be any signs of active infection which is great news and overall I am extremely pleased with where things stand at this time. No fevers, chills, nausea, vomiting, or diarrhea. Objective Constitutional Obese and well-hydrated in no acute distress. Vitals Time Taken: 3:28 PM, Height: 65 in, Weight: 220 lbs, BMI: 36.6, Temperature: 98  F, Pulse: 83 bpm, Respiratory Rate: 18 breaths/min, Blood Pressure: 143/79 mmHg. Respiratory normal breathing without difficulty. Psychiatric KERISSA, COIA ([id number]) this patient is able to make decisions and demonstrates good insight into disease process. Alert and Oriented x 3. pleasant and cooperative. General Notes: Patient did have a little bit of rubbing at the top of her wrap but to be honest I think it otherwise looks like is doing extremely well. I did actually go ahead and recommend that we continue to 3 layer but we are going to put a piece of foam behind that area to try to pattern a little. She is in agreement with that plan. Integumentary (Hair, Skin) Wound #3 status is Open. Original cause of wound was Trauma. The wound is located on the Right,Medial Lower Leg. The wound measures 1cm length x 1cm width x 0.2cm depth; 0.785cm^2 area and 0.157cm^3 volume. There is Fat Layer (Subcutaneous Tissue) exposed. There is no tunneling or undermining noted. There is a medium amount of serosanguineous drainage noted. The wound margin is flat and intact. There is medium (34-66%) pink granulation within the wound bed. There is a medium (34-66%) amount of necrotic tissue within the wound bed including Adherent Slough. Assessment Active Problems ICD-10 Venous insufficiency (chronic) (peripheral) Lymphedema, not elsewhere classified Non-pressure chronic ulcer of other part of right lower leg with fat layer exposed Type 2 diabetes mellitus with other skin ulcer Chronic combined systolic (congestive) and diastolic (congestive) heart failure Chronic obstructive pulmonary disease, unspecified Plan Follow-up Appointments: Return Appointment in 1 week. Edema Control - Lymphedema / Segmental Compressive Device / Other: Elevate, Exercise Daily and Avoid Standing for Long Periods of Time. Elevate legs to the level of the heart and pump ankles as often as possible Elevate leg(s) parallel to  the floor when sitting. WOUND #3: - Lower Leg Wound  Laterality: Right, Medial Primary Dressing: Prisma 4.34 (in) 1 x Per Week/ Discharge Instructions: Moisten w/normal saline or sterile water; Cover wound as directed. Do not remove from wound bed. Secondary Dressing: ABD Pad 5x9 (in/in) 1 x Per Week/ Discharge Instructions: Cover with ABD pad Compression Wrap: Profore Lite LF 3 Multilayer Compression Bandaging System 1 x Per Week/ Discharge Instructions: Apply 3 multi-layer wrap as prescribed. 1. Would recommend currently that we going continue with the wound care measures as before and the patient is in agreement with that since he is doing so well. This includes the use of the silver collagen to the wound bed. 2. We will also continue with 3 layer compression wrap that seems to be doing excellent for her hopefully she will not need this much longer. 3. I am also can recommend that she continue to elevate her legs much as possible try to keep edema to control. We will see patient back for reevaluation in 1 week here in the clinic. If anything worsens or changes patient will contact our office for additional recommendations. Electronic Signature(s) Signed: 03/25/2020 4:18:23 PM By: Worthy Keeler PA-C Entered By: Worthy Keeler on 03/25/2020 16:18:23 Alexandria [name] ([id number]) -------------------------------------------------------------------------------- SuperBill Details Patient Name: Alexandria [name] Date of Service: 03/25/2020 Medical Record Number: [id number] Patient Account Number: [id number] Date of Birth/Sex: 13-Apr-1935 (84 y.o. F) Treating RN: Carlene Coria Primary Care Provider: Elsie Stain Other Clinician: Referring Provider: Elsie Stain Treating Provider/Extender: Skipper Cliche in Treatment: 2 Diagnosis Coding ICD-10 Codes Code Description I87.2 Venous insufficiency (chronic) (peripheral) I89.0 Lymphedema, not elsewhere classified L97.812 Non-pressure  chronic ulcer of other part of right lower leg with fat layer exposed E11.622 Type 2 diabetes mellitus with other skin ulcer I50.42 Chronic combined systolic (congestive) and diastolic (congestive) heart failure J44.9 Chronic obstructive pulmonary disease, unspecified Facility Procedures CPT4 Code: [id number] Description: (Facility Use Only) [phone] - [place] LWR LT LEG Modifier: Quantity: 1 Physician Procedures CPT4 Code: [id number] Description: 95188 - WC PHYS LEVEL 3 - EST PT Modifier: Quantity: 1 CPT4 Code: Description: ICD-10 Diagnosis Description I87.2 Venous insufficiency (chronic) (peripheral) I89.0 Lymphedema, not elsewhere classified C16.606 Non-pressure chronic ulcer of other part of right lower leg with fat la E11.622 Type 2 diabetes mellitus with  other skin ulcer Modifier: yer exposed Quantity: Electronic Signature(s) Signed: 03/25/2020 4:18:35 PM By: Worthy Keeler PA-C Entered By: Worthy Keeler on 03/25/2020 16:18:35

## 2020-03-28 NOTE — Progress Notes (Signed)
Alexandria [name], Alexandria [name] ([id number]) Visit Report for 03/25/2020 Arrival Information Details Patient Name: Alexandria [name], Alexandria [name] Date of Service: 03/25/2020 2:30 PM Medical Record Number: [id number] Patient Account Number: [id number] Date of Birth/Sex: [birth year] (85 y.o. F) Treating RN: Carlene Coria Primary Care Madalynne Gutmann: Elsie Stain Other Clinician: Referring Marykate Heuberger: Elsie Stain Treating Kohan Azizi/Extender: Skipper Cliche in Treatment: 2 Visit Information History Since Last Visit All ordered tests and consults were completed: No Patient Arrived: Alexandria [name] Added or deleted any medications: No Arrival Time: 15:25 Any new allergies or adverse reactions: No Accompanied By: self Had a fall or experienced change in No Transfer Assistance: None activities of daily living that may affect Patient Identification Verified: Yes risk of falls: Secondary Verification Process Completed: Yes Signs or symptoms of abuse/neglect since last visito No Patient Requires Transmission-Based Precautions: No Hospitalized since last visit: No Patient Has Alerts: No Implantable device outside of the clinic excluding No cellular tissue based products placed in the center since last visit: Has Dressing in Place as Prescribed: Yes Has Compression in Place as Prescribed: Yes Pain Present Now: No Electronic Signature(s) Signed: 03/28/2020 10:25:07 AM By: Carlene Coria RN Entered By: Carlene Coria on 03/25/2020 15:28:20 Alexandria [name] ([id number]) -------------------------------------------------------------------------------- Compression Therapy Details Patient Name: Alexandria [name] Date of Service: 03/25/2020 2:30 PM Medical Record Number: [id number] Patient Account Number: [id number] Date of Birth/Sex: 09/11/1935 (84 y.o. F) Treating RN: Carlene Coria Primary Care Damonie Ellenwood: Elsie Stain Other Clinician: Referring Markevion Lattin: Elsie Stain Treating Lajoy Vanamburg/Extender: Skipper Cliche in Treatment:  2 Compression Therapy Performed for Wound Assessment: Wound #3 Right,Medial Lower Leg Performed By: Clinician Carlene Coria, RN Compression Type: Three Layer Post Procedure Diagnosis Same as Pre-procedure Electronic Signature(s) Signed: 03/28/2020 10:25:07 AM By: Carlene Coria RN Entered By: Carlene Coria on 03/25/2020 16:24:13 Alexandria [name] ([id number]) -------------------------------------------------------------------------------- Encounter Discharge Information Details Patient Name: Alexandria [name] Date of Service: 03/25/2020 2:30 PM Medical Record Number: [id number] Patient Account Number: [id number] Date of Birth/Sex: 04-12-1935 (84 y.o. F) Treating RN: Carlene Coria Primary Care Felipe Paluch: Elsie Stain Other Clinician: Referring Farrell Pantaleo: Elsie Stain Treating Niajah Sipos/Extender: Skipper Cliche in Treatment: 2 Encounter Discharge Information Items Discharge Condition: Stable Ambulatory Status: Walker Discharge Destination: Home Transportation: Private Auto Accompanied By: self Schedule Follow-up Appointment: Yes Clinical Summary of Care: Patient Declined Electronic Signature(s) Signed: 03/28/2020 10:25:07 AM By: Carlene Coria RN Entered By: Carlene Coria on 03/25/2020 16:25:02 Alexandria [name] ([id number]) -------------------------------------------------------------------------------- Lower Extremity Assessment Details Patient Name: Alexandria [name] Date of Service: 03/25/2020 2:30 PM Medical Record Number: [id number] Patient Account Number: [id number] Date of Birth/Sex: [birth year] (84 y.o. F) Treating RN: Carlene Coria Primary Care Bianney Rockwood: Elsie Stain Other Clinician: Referring Wasyl Dornfeld: Elsie Stain Treating Prince Olivier/Extender: Jeri Cos Weeks in Treatment: 2 Edema Assessment Assessed: Alexandria [name]: No] [Right: No] [Left: Edema] [Right: :] Calf Left: Right: Point of Measurement: From Medial Instep 39 cm Ankle Left: Right: Point of Measurement: From Medial  Instep 23 cm Electronic Signature(s) Signed: 03/28/2020 10:25:07 AM By: Carlene Coria RN Entered By: Carlene Coria on 03/25/2020 15:39:07 Alexandria [name] ([id number]) -------------------------------------------------------------------------------- Multi Wound Chart Details Patient Name: Alexandria [name] Date of Service: 03/25/2020 2:30 PM Medical Record Number: [id number] Patient Account Number: [id number] Date of Birth/Sex: 11-25-1935 (84 y.o. 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 Comorbid History: Anemia, Chronic Obstructive N/A N/A Pulmonary Disease (COPD), Sleep Apnea, Congestive Heart Failure, Hypertension, Type II Diabetes, Neuropathy Date Acquired: 01/31/2020 N/A N/A Weeks of Treatment: 2 N/A N/A Wound Status: Open N/A N/A Measurements L x W x D (cm) 1x1x0.2 N/A N/A Area (cm) : 0.785 N/A N/A Volume (cm) : 0.157 N/A N/A % Reduction in Area: 61.60% N/A N/A % Reduction in Volume: 74.40% N/A N/A Classification: Grade 2 N/A N/A Exudate Amount: Medium N/A N/A Exudate Type: Serosanguineous N/A N/A Exudate Color: red, brown N/A N/A Wound Margin: Flat and Intact N/A N/A Granulation Amount: Medium (34-66%) N/A N/A Granulation Quality: Pink N/A N/A Necrotic Amount: Medium (34-66%) N/A N/A Exposed Structures: Fat Layer (Subcutaneous Tissue): N/A N/A Yes Fascia: No Tendon: No Muscle: No Joint: No Bone: No Epithelialization: None N/A N/A Treatment Notes Electronic Signature(s) Signed: 03/28/2020  10:25:07 AM By: Carlene Coria RN Entered By: Carlene Coria on 03/25/2020 16:11:48 Alexandria [name] ([name] ([id number]) -------------------------------------------------------------------------------- Multi-Disciplinary Care Plan Details Patient Name: Alexandria [name] Date of Service: 03/25/2020 2:30 PM Medical Record Number: [id number] Patient Account Number: [id number] Date of Birth/Sex: Jul 18, 1935 (84 y.o. F) Treating RN: Carlene Coria Primary Care Karole Oo: Elsie Stain Other Clinician: Referring Nirav Sweda: Elsie Stain Treating Ura Yingling/Extender: Skipper Cliche in Treatment: 2 Active Inactive Wound/Skin Impairment Nursing Diagnoses: Knowledge deficit related to ulceration/compromised skin integrity Goals: Patient/caregiver will verbalize understanding of skin care regimen Date Initiated: 03/10/2020 Target Resolution Date: 04/10/2020 Goal Status: Active Ulcer/skin breakdown will have a volume reduction of 30% by week 4 Date Initiated: 03/10/2020 Target Resolution Date: 04/10/2020 Goal Status: Active Interventions: Assess patient/caregiver ability to obtain necessary supplies Assess patient/caregiver ability to perform ulcer/skin care regimen upon admission and as needed Assess ulceration(s) every visit Notes: Electronic Signature(s) Signed: 03/28/2020 10:25:07 AM By: Carlene Coria RN Entered By: Carlene Coria on 03/25/2020 16:11:35 Alexandria [name] ([id number]) -------------------------------------------------------------------------------- Pain Assessment Details Patient Name: Alexandria [name] Date of Service: 03/25/2020 2:30 PM Medical Record Number: [id number] Patient Account Number: [id number] Date of Birth/Sex: [birth year] (84 y.o. F) Treating RN: Carlene Coria Primary Care Shantea Poulton: Elsie Stain Other Clinician: Referring Kacee Sukhu: Elsie Stain Treating Deatrice Spanbauer/Extender: Skipper Cliche in Treatment: 2 Active Problems Location of Pain Severity  and Description of Pain Patient Has Paino No Site Locations Pain Management and Medication Current Pain Management: Electronic Signature(s) Signed: 03/28/2020 10:25:07 AM By: Carlene Coria RN Entered By: Carlene Coria on 03/25/2020 15:29:01 Alexandria [name] ([id number]) -------------------------------------------------------------------------------- Patient/Caregiver Education Details Patient Name: Alexandria [name] Date of Service: 03/25/2020 2:30 PM Medical Record Number: [id number] Patient Account Number: [id number] Date of Birth/Gender: [birth year] (84 y.o. F) Treating RN: Carlene Coria Primary Care Physician: Elsie Stain Other Clinician: Referring Physician: Elsie Stain Treating Physician/Extender: Skipper Cliche in Treatment: 2 Education Assessment Education Provided To: Patient Education Topics Provided Wound/Skin Impairment: Methods: Explain/Verbal Responses: State content correctly Electronic Signature(s) Signed: 03/28/2020 10:25:07 AM By: Carlene Coria RN Entered By: Carlene Coria on 03/25/2020 16:14:17 Alexandria [name] ([id number]) -------------------------------------------------------------------------------- Wound Assessment Details Patient Name: Alexandria [name] Date of Service: 03/25/2020 2:30 PM Medical Record Number: [id number] Patient Account Number: [id number] Date of Birth/Sex: Jul 12, 1935 (84 y.o. F) Treating RN: Carlene Coria Primary Care Iktan Aikman: Elsie Stain Other Clinician: Referring Zettie Gootee: Elsie Stain Treating Marieclaire Bettenhausen/Extender: Jeri Cos Weeks in Treatment: 2 Wound Status Wound Number: 3 Primary Diabetic Wound/Ulcer of the Lower Extremity Etiology: Wound  Location: Right, Medial Lower Leg Wound Open Wounding Event: Trauma Status: Date Acquired: 01/31/2020 Comorbid Anemia, Chronic Obstructive Pulmonary Disease (COPD), Weeks Of Treatment: 2 History: Sleep Apnea, Congestive Heart Failure, Hypertension, Clustered Wound: No Type II  Diabetes, Neuropathy Photos Wound Measurements Length: (cm) 1 % Redu Width: (cm) 1 % Redu Depth: (cm) 0.2 Epithe Area: (cm) 0.785 Tunne Volume: (cm) 0.157 Under ction in Area: 61.6% ction in Volume: 74.4% lialization: None ling: No mining: No Wound Description Classification: Grade 2 Foul Wound Margin: Flat and Intact Sloug Exudate Amount: Medium Exudate Type: Serosanguineous Exudate Color: red, brown Odor After Cleansing: No h/Fibrino Yes Wound Bed Granulation Amount: Medium (34-66%) Exposed Structure Granulation Quality: Pink Fascia Exposed: No Necrotic Amount: Medium (34-66%) Fat Layer (Subcutaneous Tissue) Exposed: Yes Necrotic Quality: Adherent Slough Tendon Exposed: No Muscle Exposed: No Joint Exposed: No Bone Exposed: No Treatment Notes Wound #3 (Lower Leg) Wound Laterality: Right, Medial Cleanser Peri-Wound Care Topical Niess, Alondra E. ([id number]) Primary Dressing Prisma 4.34 (in) Discharge Instruction: Moisten w/normal saline or sterile water; Cover wound as directed. Do not remove from wound bed. Secondary Dressing ABD Pad 5x9 (in/in) Discharge Instruction: Cover with ABD pad Secured With Compression Wrap Profore Lite LF 3 Multilayer Compression Bandaging System Discharge Instruction: Apply 3 multi-layer wrap as prescribed. Compression Stockings Add-Ons Electronic Signature(s) Signed: 03/28/2020 10:25:07 AM By: Carlene Coria RN Entered By: Carlene Coria on 03/25/2020 15:37:38 Alexandria [name] ([id number]) -------------------------------------------------------------------------------- Vitals Details Patient Name: Alexandria [name] Date of Service: 03/25/2020 2:30 PM Medical Record Number: [id number] Patient Account Number: [id number] Date of Birth/Sex: [birth year] (84 y.o. F) Treating RN: Carlene Coria Primary Care Nickolas Chalfin: Elsie Stain Other Clinician: Referring Amiri Tritch: Elsie Stain Treating Willoughby Doell/Extender: Skipper Cliche in  Treatment: 2 Vital Signs Time Taken: 15:28 Temperature (F): 98 Height (in): 65 Pulse (bpm): 83 Weight (lbs): 220 Respiratory Rate (breaths/min): 18 Body Mass Index (BMI): 36.6 Blood Pressure (mmHg): 143/79 Reference Range: 80 - 120 mg / dl Electronic Signature(s) Signed: 03/28/2020 10:25:07 AM By: Carlene Coria RN Entered By: Carlene Coria on 03/25/2020 15:28:52

## 2020-04-01 ENCOUNTER — Ambulatory Visit: Payer: Medicare Other | Admitting: Physician Assistant

## 2020-04-03 ENCOUNTER — Ambulatory Visit: Payer: Medicare Other | Admitting: Physician Assistant

## 2020-04-08 ENCOUNTER — Other Ambulatory Visit: Payer: Self-pay

## 2020-04-08 ENCOUNTER — Encounter: Payer: Medicare Other | Attending: Physician Assistant | Admitting: Physician Assistant

## 2020-04-08 DIAGNOSIS — E11622 Type 2 diabetes mellitus with other skin ulcer: Secondary | ICD-10-CM | POA: Insufficient documentation

## 2020-04-08 DIAGNOSIS — I110 Hypertensive heart disease with heart failure: Secondary | ICD-10-CM | POA: Insufficient documentation

## 2020-04-08 DIAGNOSIS — I5042 Chronic combined systolic (congestive) and diastolic (congestive) heart failure: Secondary | ICD-10-CM | POA: Diagnosis not present

## 2020-04-08 DIAGNOSIS — I890 Lymphedema, not elsewhere classified: Secondary | ICD-10-CM | POA: Diagnosis not present

## 2020-04-08 DIAGNOSIS — E1151 Type 2 diabetes mellitus with diabetic peripheral angiopathy without gangrene: Secondary | ICD-10-CM | POA: Diagnosis not present

## 2020-04-08 DIAGNOSIS — L97812 Non-pressure chronic ulcer of other part of right lower leg with fat layer exposed: Secondary | ICD-10-CM | POA: Insufficient documentation

## 2020-04-08 DIAGNOSIS — E1142 Type 2 diabetes mellitus with diabetic polyneuropathy: Secondary | ICD-10-CM | POA: Insufficient documentation

## 2020-04-08 DIAGNOSIS — I872 Venous insufficiency (chronic) (peripheral): Secondary | ICD-10-CM | POA: Diagnosis not present

## 2020-04-08 NOTE — Progress Notes (Addendum)
ONITA, PFLUGER ([id number]) Visit Report for 04/08/2020 Chief Complaint Document Details Patient Name: Alexandria [name], Alexandria [name] Date of Service: 04/08/2020 3:45 PM Medical Record Number: [id number] Patient Account Number: [id number] Date of Birth/Sex: 08-23-1935 (85 y.o. F) Treating RN: Dolan Amen Primary Care Provider: Elsie Stain Other Clinician: Referring Provider: Elsie Stain Treating Provider/Extender: Skipper Cliche in Treatment: 4 Information Obtained from: Patient Chief Complaint Right LE Ulcer Electronic Signature(s) Signed: 04/08/2020 3:59:39 PM By: Worthy Keeler PA-C Entered By: Worthy Keeler on 04/08/2020 15:59:39 Alexandria [name] ([id number]) -------------------------------------------------------------------------------- HPI Details Patient Name: Alexandria [name] Date of Service: 04/08/2020 3:45 PM Medical Record Number: [id number] Patient Account Number: [id number] Date of Birth/Sex: 02-06-1936 (85 y.o. F) Treating RN: Dolan Amen Primary Care Provider: Elsie Stain Other Clinician: Referring Provider: Elsie Stain Treating Provider/Extender: Skipper Cliche in Treatment: 4 History of Present Illness HPI Description: 85 year old diabetic patient who recently was seen in the ER for a knee laceration which was closed with staples and then Steri-Stripped. She then developed a cellulitis on her right lower extremity and had to be treated with Septra and was previously on Keflex. She was asked to use a leg immobilizer and limited range of motion to 45o at the knee. The patient is known to have a past medical history of depression, hypothyroidism, hypertension, diastolic CHF, hyperlipidemia, diabetes mellitus, peripheral neuropathy. she is being treated by her pulmonologist for COPD with emphysema with excarcebation and was given a course of prednisone recently. She also was asked to use a CPAP machine and other symptomatic treatment was given. Last hemoglobin  A1c was 5.8% 2 months ago. she used to be a heavy smoker but has quit about 11 years ago. 11/04/2016 -- the patient had a lower extremity venous duplex reflux evaluation done on 11/03/2016 and this showed no evidence of acute bilateral lower extremity deep vein thrombosis. She had reflux in bilateral common femoral veins and there was reflux in the bilateral saphenofemoral junction. However they were unable to obtain reflux in the bilateral great saphenous veins of the small saphenous veins and it was a difficult examination technically. [place] wanted some advice about wound management while they were to be 11/12/2016 -- she returns after a vacation but has a new injury to her right lateral calf which she lacerated on a blunt object 11/25/2016 -- the patient still has not gotten in touch with her cardiologist regarding her diuretics and she continues to have significant lymphedema right lower extremity was then left Readmission: 03/10/2020 upon evaluation today patient appears to be doing somewhat poorly in regard to her right lower extremity as a result of having dropped a 40 ounce jar of peanut butter on her leg about a month ago causing a traumatic injury. She does have significant swelling and even some very mild erythema she has been on Keflex for urinary tract infection and tells me that really the wound has improved significantly during the time she is been on the Keflex. Fortunately there is no signs of active infection at this time. No fevers, chills, nausea, vomiting, or diarrhea. The patient does have a history of chronic venous insufficiency, lymphedema, diabetes mellitus type 2, congestive heart failure, and COPD. 03/17/2020 upon evaluation today patient appears to be doing well in regard to her wound currently. Fortunately there is no sign of active infection at this time. I do believe that the leg ulcer is making good progress. This is still  a  wound that I think would benefit from continued wrapping she really did want to try to discontinue the wrapping if at all possible but again I feel like that would be ideal. I did leave it open to discussion and in the end that the patient decided she would like to do what is best to try to get this healed as quickly as possible. Therefore we can continue to wrap the leg at this time she is in agreement with that plan. 03/25/2020 upon evaluation today patient appears to be doing well at this point in regard to her wound. In fact this is measuring significantly smaller leading to skin around the edges. There does not appear to be any signs of active infection which is great news and overall I am extremely pleased with where things stand at this time. No fevers, chills, nausea, vomiting, or diarrhea. 04/11/2020 upon evaluation today patient appears to actually be doing quite well with regard to her wound. This is measuring smaller and overall seems to be making good progress. She is not completely healed but she is very [name]. Fortunately there is no evidence of active infection no need for sharp debridement today. Electronic Signature(s) Signed: 04/11/2020 2:21:46 PM By: Worthy Keeler PA-C Entered By: Worthy Keeler on 04/11/2020 14:21:46 Alexandria [name] ([id number]) -------------------------------------------------------------------------------- Physical Exam Details Patient Name: Alexandria [name] Date of Service: 04/08/2020 3:45 PM Medical Record Number: [id number] Patient Account Number: [id number] Date of Birth/Sex: 04-14-1935 (85 y.o. F) Treating RN: Dolan Amen Primary Care Provider: Elsie Stain Other Clinician: Referring Provider: Elsie Stain Treating Provider/Extender: Jeri Cos Weeks in Treatment: 4 Constitutional Well-nourished and well-hydrated in no acute distress. Respiratory normal breathing without difficulty. Psychiatric this patient is able to make decisions and  demonstrates good insight into disease process. Alert and Oriented x 3. pleasant and cooperative. Notes Upon inspection patient's wound bed actually again showed signs of excellent granulation epithelization I do believe the compression wrap has been helpful and again we did not have to perform any sharp debridement today which is also great news. In general I am extremely pleased with how things seem to be progressing. Electronic Signature(s) Signed: 04/11/2020 2:21:59 PM By: Worthy Keeler PA-C Entered By: Worthy Keeler on 04/11/2020 14:21:59 Alexandria [name] ([id number]) -------------------------------------------------------------------------------- Physician Orders Details Patient Name: Alexandria [name] Date of Service: 04/08/2020 3:45 PM Medical Record Number: [id number] Patient Account Number: [id number] Date of Birth/Sex: Dec 17, 1935 (84 y.o. F) Treating RN: Cornell Barman Primary Care Provider: Elsie Stain Other Clinician: Referring Provider: Elsie Stain Treating Provider/Extender: Skipper Cliche in Treatment: 4 Verbal / Phone Orders: No Diagnosis Coding ICD-10 Coding Code Description I87.2 Venous insufficiency (chronic) (peripheral) I89.0 Lymphedema, not elsewhere classified L97.812 Non-pressure chronic ulcer of other part of right lower leg with fat layer exposed E11.622 Type 2 diabetes mellitus with other skin ulcer I50.42 Chronic combined systolic (congestive) and diastolic (congestive) heart failure J44.9 Chronic obstructive pulmonary disease, unspecified Follow-up Appointments o Return Appointment in 1 week. Edema Control - Lymphedema / Segmental Compressive Device / Other Right Lower Extremity o Optional: One layer of unna paste to top of compression wrap (to act as an anchor). o 3 Layer Compression System for Lymphedema. o Elevate, Exercise Daily and Avoid Standing for Long Periods of Time. o Elevate legs to the level of the heart and pump ankles as  often as possible o Elevate leg(s) parallel to the floor when sitting. Wound Treatment Wound #3 - Lower Leg Wound Laterality: Right,  Medial Cleanser: Normal Saline (Generic) 1 x Per Week/30 Days Discharge Instructions: Wash your hands with soap and water. Remove old dressing, discard into plastic bag and place into trash. Cleanse the wound with Normal Saline prior to applying a clean dressing using gauze sponges, not tissues or cotton balls. Do not scrub or use excessive force. Pat dry using gauze sponges, not tissue or cotton balls. Primary Dressing: Silvercel Small 2x2 (in/in) (Generic) 1 x Per Week/30 Days Discharge Instructions: Apply Silvercel Small 2x2 (in/in) as instructed Secondary Dressing: ABD Pad 5x9 (in/in) 1 x Per Week/30 Days Discharge Instructions: Cover with ABD pad Compression Wrap: Profore Lite LF 3 Multilayer Compression Bandaging System 1 x Per Week/30 Days Discharge Instructions: Apply 3 multi-layer wrap as prescribed. Compression Wrap: Unna w/Calamine, 4x10 (in/yd) (Generic) 1 x Per Week/30 Days Discharge Instructions: Apply unna paste -3 finger-widths below knee to anchor compression wrap in place. Electronic Signature(s) Signed: 04/08/2020 4:36:53 PM By: Georges Mouse, Minus Breeding RN Signed: 04/08/2020 9:50:44 PM By: Worthy Keeler PA-C Entered By: Georges Mouse, Minus Breeding on 04/08/2020 16:08:47 Alexandria [name] ([id number]) -------------------------------------------------------------------------------- Problem List Details Patient Name: Alexandria [name] Date of Service: 04/08/2020 3:45 PM Medical Record Number: [id number] Patient Account Number: [id number] Date of Birth/Sex: 1936-01-21 (84 y.o. F) Treating RN: Dolan Amen Primary Care Provider: Elsie Stain Other Clinician: Referring Provider: Elsie Stain Treating Provider/Extender: Skipper Cliche in Treatment: 4 Active Problems ICD-10 Encounter Code Description Active Date MDM Diagnosis I87.2 Venous  insufficiency (chronic) (peripheral) 03/10/2020 No Yes I89.0 Lymphedema, not elsewhere classified 03/10/2020 No Yes L97.812 Non-pressure chronic ulcer of other part of right lower leg with fat layer 03/10/2020 No Yes exposed E11.622 Type 2 diabetes mellitus with other skin ulcer 03/10/2020 No Yes I50.42 Chronic combined systolic (congestive) and diastolic (congestive) heart 03/10/2020 No Yes failure J44.9 Chronic obstructive pulmonary disease, unspecified 03/10/2020 No Yes Inactive Problems Resolved Problems Electronic Signature(s) Signed: 04/08/2020 3:59:33 PM By: Worthy Keeler PA-C Entered By: Worthy Keeler on 04/08/2020 15:59:32 Alexandria [name] ([id number]) -------------------------------------------------------------------------------- Progress Note Details Patient Name: Alexandria [name] Date of Service: 04/08/2020 3:45 PM Medical Record Number: [id number] Patient Account Number: [id number] Date of Birth/Sex: 1936-01-18 (84 y.o. F) Treating RN: Dolan Amen Primary Care Provider: Elsie Stain Other Clinician: Referring Provider: Elsie Stain Treating Provider/Extender: Skipper Cliche in Treatment: 4 Subjective Chief Complaint Information obtained from Patient Right LE Ulcer History of Present Illness (HPI) 85 year old diabetic patient who recently was seen in the ER for a knee laceration which was closed with staples and then Steri-Stripped. She then developed a cellulitis on her right lower extremity and had to be treated with Septra and was previously on Keflex. She was asked to use a leg immobilizer and limited range of motion to 45 at the knee. The patient is known to have a past medical history of depression, hypothyroidism, hypertension, diastolic CHF, hyperlipidemia, diabetes mellitus, peripheral neuropathy. she is being treated by her pulmonologist for COPD with emphysema with excarcebation and was given a course of prednisone recently. She also was asked to use a CPAP  machine and other symptomatic treatment was given. Last hemoglobin A1c was 5.8% 2 months ago. she used to be a heavy smoker but has quit about 11 years ago. 11/04/2016 -- the patient had a lower extremity venous duplex reflux evaluation done on 11/03/2016 and this showed no evidence of acute bilateral lower extremity deep vein thrombosis. She had reflux in bilateral common femoral veins and there was reflux in the bilateral saphenofemoral  junction. However they were unable to obtain reflux in the bilateral great saphenous veins of the small saphenous veins and it was a difficult examination technically. [place] and wanted some advice about wound management while they were to be 11/12/2016 -- she returns after a vacation but has a new injury to her right lateral calf which she lacerated on a blunt object 11/25/2016 -- the patient still has not gotten in touch with her cardiologist regarding her diuretics and she continues to have significant lymphedema right lower extremity was then left Readmission: 03/10/2020 upon evaluation today patient appears to be doing somewhat poorly in regard to her right lower extremity as a result of having dropped a 40 ounce jar of peanut butter on her leg about a month ago causing a traumatic injury. She does have significant swelling and even some very mild erythema she has been on Keflex for urinary tract infection and tells me that really the wound has improved significantly during the time she is been on the Keflex. Fortunately there is no signs of active infection at this time. No fevers, chills, nausea, vomiting, or diarrhea. The patient does have a history of chronic venous insufficiency, lymphedema, diabetes mellitus type 2, congestive heart failure, and COPD. 03/17/2020 upon evaluation today patient appears to be doing well in regard to her wound currently. Fortunately there is no sign of active infection at this time. I do  believe that the leg ulcer is making good progress. This is still a wound that I think would benefit from continued wrapping she really did want to try to discontinue the wrapping if at all possible but again I feel like that would be ideal. I did leave it open to discussion and in the end that the patient decided she would like to do what is best to try to get this healed as quickly as possible. Therefore we can continue to wrap the leg at this time she is in agreement with that plan. 03/25/2020 upon evaluation today patient appears to be doing well at this point in regard to her wound. In fact this is measuring significantly smaller leading to skin around the edges. There does not appear to be any signs of active infection which is great news and overall I am extremely pleased with where things stand at this time. No fevers, chills, nausea, vomiting, or diarrhea. 04/11/2020 upon evaluation today patient appears to actually be doing quite well with regard to her wound. This is measuring smaller and overall seems to be making good progress. She is not completely healed but she is very [name]. Fortunately there is no evidence of active infection no need for sharp debridement today. Objective Constitutional Well-nourished and well-hydrated in no acute distress. Vitals Time Taken: 3:53 PM, Height: 65 in, Weight: 220 lbs, BMI: 36.6, Temperature: 97.9 F, Pulse: 70 bpm, Respiratory Rate: 18 breaths/min, Blood Pressure: 151/74 mmHg. Alexandria [name], Alexandria [name] ([id number]) Respiratory normal breathing without difficulty. Psychiatric this patient is able to make decisions and demonstrates good insight into disease process. Alert and Oriented x 3. pleasant and cooperative. General Notes: Upon inspection patient's wound bed actually again showed signs of excellent granulation epithelization I do believe the compression wrap has been helpful and again we did not have to perform any sharp debridement today which is also  great news. In general I am extremely pleased with how things seem to be progressing. Integumentary (Hair, Skin) Wound #3 status is Open. Original cause  of wound was Trauma. The wound is located on the Right,Medial Lower Leg. The wound measures 0.2cm length x 0.2cm width x 0.1cm depth; 0.031cm^2 area and 0.003cm^3 volume. There is Fat Layer (Subcutaneous Tissue) exposed. There is no tunneling or undermining noted. There is a medium amount of serosanguineous drainage noted. The wound margin is flat and intact. There is large (67-100%) pink granulation within the wound bed. There is a small (1-33%) amount of necrotic tissue within the wound bed including Adherent Slough. Assessment Active Problems ICD-10 Venous insufficiency (chronic) (peripheral) Lymphedema, not elsewhere classified Non-pressure chronic ulcer of other part of right lower leg with fat layer exposed Type 2 diabetes mellitus with other skin ulcer Chronic combined systolic (congestive) and diastolic (congestive) heart failure Chronic obstructive pulmonary disease, unspecified Procedures Wound #3 Pre-procedure diagnosis of Wound #3 is a Diabetic Wound/Ulcer of the Lower Extremity located on the Right,Medial Lower Leg . There was a Three Layer Compression Therapy Procedure with a pre-treatment ABI of 1.4 by Cornell Barman, RN. Post procedure Diagnosis Wound #3: Same as Pre-Procedure Plan Follow-up Appointments: Return Appointment in 1 week. Edema Control - Lymphedema / Segmental Compressive Device / Other: Optional: One layer of unna paste to top of compression wrap (to act as an anchor). 3 Layer Compression System for Lymphedema. Elevate, Exercise Daily and Avoid Standing for Long Periods of Time. Elevate legs to the level of the heart and pump ankles as often as possible Elevate leg(s) parallel to the floor when sitting. WOUND #3: - Lower Leg Wound Laterality: Right, Medial Cleanser: Normal Saline (Generic) 1 x Per Week/30  Days Discharge Instructions: Wash your hands with soap and water. Remove old dressing, discard into plastic bag and place into trash. Cleanse the wound with Normal Saline prior to applying a clean dressing using gauze sponges, not tissues or cotton balls. Do not scrub or use excessive force. Pat dry using gauze sponges, not tissue or cotton balls. Primary Dressing: Silvercel Small 2x2 (in/in) (Generic) 1 x Per Week/30 Days Discharge Instructions: Apply Silvercel Small 2x2 (in/in) as instructed Secondary Dressing: ABD Pad 5x9 (in/in) 1 x Per Week/30 Days Discharge Instructions: Cover with ABD pad Compression Wrap: Profore Lite LF 3 Multilayer Compression Bandaging System 1 x Per Week/30 Days Discharge Instructions: Apply 3 multi-layer wrap as prescribed. Compression Wrap: Unna w/Calamine, 4x10 (in/yd) (Generic) 1 x Per Week/30 Days Discharge Instructions: Apply unna paste -3 finger-widths below knee to anchor compression wrap in place. Alexandria [name], Alexandria [name]. ([id number]) 1. Would recommend that we going continue with the wound care measures as before and the patient is in agreement with that plan. This includes the use of a 3 layer compression wrap that is keeping the edema under very good control. 2. Also can recommend a small piece of silver cell in order to help keep the area dry I think that my hope is she will be completely healed next week. We will see patient back for reevaluation in 1 week here in the clinic. If anything worsens or changes patient will contact our office for additional recommendations. Electronic Signature(s) Signed: 04/11/2020 2:22:23 PM By: Worthy Keeler PA-C Entered By: Worthy Keeler on 04/11/2020 14:22:23 Alexandria [name] ([id number]) -------------------------------------------------------------------------------- SuperBill Details Patient Name: Alexandria [name] Date of Service: 04/08/2020 Medical Record Number: [id number] Patient Account Number: [id number] Date of  Birth/Sex: November 07, 1935 (84 y.o. F) Treating RN: Cornell Barman Primary Care Provider: Elsie Stain Other Clinician: Referring Provider: Elsie Stain Treating Provider/Extender: Jeri Cos Weeks in Treatment: 4 Diagnosis Coding  ICD-10 Codes Code Description I87.2 Venous insufficiency (chronic) (peripheral) I89.0 Lymphedema, not elsewhere classified O82.417 Non-pressure chronic ulcer of other part of right lower leg with fat layer exposed E11.622 Type 2 diabetes mellitus with other skin ulcer I50.42 Chronic combined systolic (congestive) and diastolic (congestive) heart failure J44.9 Chronic obstructive pulmonary disease, unspecified Facility Procedures CPT4 Code: 53010404 Description: (Facility Use Only) [phone] - APPLY MULTLAY COMPRS LWR RT LEG Modifier: Quantity: 1 Physician Procedures CPT4 Code: 9923414 Description: 43601 - WC PHYS LEVEL 3 - EST PT Modifier: Quantity: 1 CPT4 Code: Description: ICD-10 Diagnosis Description I87.2 Venous insufficiency (chronic) (peripheral) I89.0 Lymphedema, not elsewhere classified M58.006 Non-pressure chronic ulcer of other part of right lower leg with fat la E11.622 Type 2 diabetes mellitus with  other skin ulcer Modifier: yer exposed Quantity: Electronic Signature(s) Signed: 04/11/2020 2:22:35 PM By: Worthy Keeler PA-C Previous Signature: 04/08/2020 6:03:28 PM Version By: Gretta Cool BSN, RN, CWS, Kim RN, BSN Previous Signature: 04/08/2020 9:50:44 PM Version By: Worthy Keeler PA-C Entered By: Worthy Keeler on 04/11/2020 14:22:35

## 2020-04-09 DIAGNOSIS — F4323 Adjustment disorder with mixed anxiety and depressed mood: Secondary | ICD-10-CM | POA: Diagnosis not present

## 2020-04-09 NOTE — Progress Notes (Signed)
Alexandria [name] ([id number]) Visit Report for 04/08/2020 Arrival Information Details Patient Name: Alexandria [name], Alexandria [name] Date of Service: 04/08/2020 3:45 PM Medical Record Number: [id number] Patient Account Number: [id number] Date of Birth/Sex: 12-09-1935 (85 y.o. F) Treating RN: Cornell Barman Primary Care Amiir Heckard: Elsie Stain Other Clinician: Referring Kamonte Mcmichen: Elsie Stain Treating Kesler Wickham/Extender: Skipper Cliche in Treatment: 4 Visit Information History Since Last Visit Has Dressing in Place as Prescribed: Yes Patient Arrived: Walker Has Compression in Place as Prescribed: No Arrival Time: 15:50 Pain Present Now: No Accompanied By: self Transfer Assistance: None Patient Identification Verified: Yes Secondary Verification Process Completed: Yes Patient Requires Transmission-Based Precautions: No Patient Has Alerts: No Electronic Signature(s) Signed: 04/08/2020 6:03:28 PM By: Gretta Cool, BSN, RN, CWS, Kim RN, BSN Entered By: Gretta Cool, BSN, RN, CWS, Kim on 04/08/2020 15:52:57 Alexandria [name] ([id number]) -------------------------------------------------------------------------------- Compression Therapy Details Patient Name: Alexandria [name] Date of Service: 04/08/2020 3:45 PM Medical Record Number: [id number] Patient Account Number: [id number] Date of Birth/Sex: 1935/10/31 (85 y.o. F) Treating RN: Cornell Barman Primary Care Giles Currie: Elsie Stain Other Clinician: Referring Gretta Samons: Elsie Stain Treating Lumina Gitto/Extender: Skipper Cliche in Treatment: 4 Compression Therapy Performed for Wound Assessment: Wound #3 Right,Medial Lower Leg Performed By: Clinician Cornell Barman, RN Compression Type: Three Layer Pre Treatment ABI: 1.4 Post Procedure Diagnosis Same as Pre-procedure Electronic Signature(s) Signed: 04/08/2020 6:03:28 PM By: Gretta Cool, BSN, RN, CWS, Kim RN, BSN Entered By: Gretta Cool, BSN, RN, CWS, Kim on 04/08/2020 16:04:38 Alexandria [name]  ([id number]) -------------------------------------------------------------------------------- Encounter Discharge Information Details Patient Name: Alexandria [name] Date of Service: 04/08/2020 3:45 PM Medical Record Number: [id number] Patient Account Number: [id number] Date of Birth/Sex: 1935-05-16 (85 y.o. F) Treating RN: Carlene Coria Primary Care Irvine Glorioso: Elsie Stain Other Clinician: Referring Athenia Rys: Elsie Stain Treating Willman Cuny/Extender: Skipper Cliche in Treatment: 4 Encounter Discharge Information Items Discharge Condition: Stable Ambulatory Status: Walker Discharge Destination: Home Transportation: Private Auto Accompanied By: self Schedule Follow-up Appointment: Yes Clinical Summary of Care: Patient Declined Electronic Signature(s) Signed: 04/09/2020 9:59:20 AM By: Carlene Coria RN Entered By: Carlene Coria on 04/08/2020 16:23:21 Alexandria [name] ([id number]) -------------------------------------------------------------------------------- Lower Extremity Assessment Details Patient Name: Alexandria [name] Date of Service: 04/08/2020 3:45 PM Medical Record Number: [id number] Patient Account Number: [id number] Date of Birth/Sex: 12-Oct-1935 (85 y.o. F) Treating RN: Cornell Barman Primary Care Shuna Tabor: Elsie Stain Other Clinician: Referring Carron Mcmurry: Elsie Stain Treating Cintia Gleed/Extender: Jeri Cos Weeks in Treatment: 4 Edema Assessment Assessed: [name]: No] [name]: No] [Left: Edema] [Right: :] Calf Left: Right: Point of Measurement: 32 cm From Medial Instep 47 cm Ankle Left: Right: Point of Measurement: 10 cm From Medial Instep 25.6 cm Vascular Assessment Pulses: Dorsalis Pedis Palpable: [Right:No] Posterior Tibial Doppler Audible: [Right:Yes] Electronic Signature(s) Signed: 04/08/2020 6:03:28 PM By: Gretta Cool, BSN, RN, CWS, Kim RN, BSN Entered By: Gretta Cool, BSN, RN, CWS, Kim on 04/08/2020 15:59:07 Alexandria [name]  ([id number]) -------------------------------------------------------------------------------- Multi Wound Chart Details Patient Name: Alexandria [name] Date of Service: 04/08/2020 3:45 PM Medical Record Number: [id number] Patient Account Number: [id number] Date of Birth/Sex: [birth year] (85 y.o. 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 Comorbid History: Anemia, Chronic Obstructive N/A N/A Pulmonary Disease (COPD),  Sleep Apnea, Congestive Heart Failure, Hypertension, Type II Diabetes, Neuropathy Date Acquired: 01/31/2020 N/A N/A Weeks of Treatment: 4 N/A N/A Wound Status: Open N/A N/A Measurements L x W x D (cm) 0.2x0.2x0.1 N/A N/A Area (cm) : 0.031 N/A N/A Volume (cm) : 0.003 N/A N/A % Reduction in Area: 98.50% N/A N/A % Reduction in Volume: 99.50% N/A N/A Classification: Grade 2 N/A N/A Exudate Amount: Medium N/A N/A Exudate Type: Serosanguineous N/A N/A Exudate Color: red, brown N/A N/A Wound Margin: Flat and Intact N/A N/A Granulation Amount: Large (67-100%) N/A N/A Granulation Quality: Pink N/A N/A Necrotic Amount: Small (1-33%) N/A N/A Exposed Structures: Fat Layer (Subcutaneous Tissue): N/A N/A Yes Fascia: No Tendon: No Muscle: No Joint: No Bone: No Epithelialization: None N/A N/A Treatment Notes Electronic Signature(s) Signed: 04/08/2020 6:03:28 PM By: Gretta Cool, BSN, RN, CWS, Kim RN, BSN Entered By: Gretta Cool, BSN, RN, CWS, Kim on 04/08/2020 16:00:55 Alexandria [name] ([name]  ([id number]) -------------------------------------------------------------------------------- Multi-Disciplinary Care Plan Details Patient Name: Alexandria [name] Date of Service: 04/08/2020 3:45 PM Medical Record Number: [id number] Patient Account Number: [id number] Date of Birth/Sex: 14-Nov-1935 (85 y.o. F) Treating RN: Cornell Barman Primary Care Jayni Prescher: Elsie Stain Other Clinician: Referring Vickie Ponds: Elsie Stain Treating Hannah Crill/Extender: Skipper Cliche in Treatment: 4 Active Inactive Wound/Skin Impairment Nursing Diagnoses: Knowledge deficit related to ulceration/compromised skin integrity Goals: Patient/caregiver will verbalize understanding of skin care regimen Date Initiated: 03/10/2020 Target Resolution Date: 04/10/2020 Goal Status: Active Ulcer/skin breakdown will have a volume reduction of 30% by week 4 Date Initiated: 03/10/2020 Target Resolution Date: 04/10/2020 Goal Status: Active Interventions: Assess patient/caregiver ability to obtain necessary supplies Assess patient/caregiver ability to perform ulcer/skin care regimen upon admission and as needed Assess ulceration(s) every visit Notes: Electronic Signature(s) Signed: 04/08/2020 6:03:28 PM By: Gretta Cool, BSN, RN, CWS, Kim RN, BSN Entered By: Gretta Cool, BSN, RN, CWS, Kim on 04/08/2020 16:00:28 Alexandria [name] ([id number]) -------------------------------------------------------------------------------- Pain Assessment Details Patient Name: Alexandria [name] Date of Service: 04/08/2020 3:45 PM Medical Record Number: [id number] Patient Account Number: [id number] Date of Birth/Sex: 1936-01-23 (85 y.o. F) Treating RN: Cornell Barman Primary Care Abdalrahman Clementson: Elsie Stain Other Clinician: Referring Skip Litke: Elsie Stain Treating Steffie Waggoner/Extender: Skipper Cliche in Treatment: 4 Active Problems Location of Pain Severity and Description of Pain Patient Has Paino No Site Locations Pain Management and Medication Current Pain  Management: Notes Patient denies pain at this time. Electronic Signature(s) Signed: 04/08/2020 6:03:28 PM By: Gretta Cool, BSN, RN, CWS, Kim RN, BSN Entered By: Gretta Cool, BSN, RN, CWS, Kim on 04/08/2020 15:53:43 Alexandria [name] ([id number]) -------------------------------------------------------------------------------- Patient/Caregiver Education Details Patient Name: Alexandria [name] Date of Service: 04/08/2020 3:45 PM Medical Record Number: [id number] Patient Account Number: [id number] Date of Birth/Gender: 1935-12-26 (85 y.o. F) Treating RN: Cornell Barman Primary Care Physician: Elsie Stain Other Clinician: Referring Physician: Elsie Stain Treating Physician/Extender: Skipper Cliche in Treatment: 4 Education Assessment Education Provided To: Patient Education Topics Provided Wound/Skin Impairment: Methods: Explain/Verbal Responses: State content correctly Electronic Signature(s) Signed: 04/08/2020 6:03:28 PM By: Gretta Cool, BSN, RN, CWS, Kim RN, BSN Entered By: Gretta Cool, BSN, RN, CWS, Kim on 04/08/2020 16:06:08 Alexandria [name] ([id number]) -------------------------------------------------------------------------------- Wound Assessment Details Patient Name: Alexandria [name] Date of Service: 04/08/2020 3:45 PM Medical Record Number: [id number] Patient Account Number: [id number] Date of Birth/Sex: 25-Jun-1935 (85 y.o. F) Treating RN: Cornell Barman Primary Care Travelle Mcclimans: Elsie Stain Other Clinician: Referring Elyana Grabski: Elsie Stain Treating Allyson Tineo/Extender: Jeri Cos Weeks in Treatment: 4 Wound Status Wound Number: 3 Primary Diabetic Wound/Ulcer of the  Lower Extremity Etiology: Wound Location: Right, Medial Lower Leg Wound Open Wounding Event: Trauma Status: Date Acquired: 01/31/2020 Comorbid Anemia, Chronic Obstructive Pulmonary Disease (COPD), Weeks Of Treatment: 4 History: Sleep Apnea, Congestive Heart Failure, Hypertension, Clustered Wound: No Type II Diabetes,  Neuropathy Photos Wound Measurements Length: (cm) 0.2 Width: (cm) 0.2 Depth: (cm) 0.1 Area: (cm) 0.031 Volume: (cm) 0.003 % Reduction in Area: 98.5% % Reduction in Volume: 99.5% Epithelialization: None Tunneling: No Undermining: No Wound Description Classification: Grade 2 Wound Margin: Flat and Intact Exudate Amount: Medium Exudate Type: Serosanguineous Exudate Color: red, brown Foul Odor After Cleansing: No Slough/Fibrino Yes Wound Bed Granulation Amount: Large (67-100%) Exposed Structure Granulation Quality: Pink Fascia Exposed: No Necrotic Amount: Small (1-33%) Fat Layer (Subcutaneous Tissue) Exposed: Yes Necrotic Quality: Adherent Slough Tendon Exposed: No Muscle Exposed: No Joint Exposed: No Bone Exposed: No Treatment Notes Wound #3 (Lower Leg) Wound Laterality: Right, Medial Cleanser Normal Saline Discharge Instruction: Wash your hands with soap and water. Remove old dressing, discard into plastic bag and place into trash. Cleanse the wound with Normal Saline prior to applying a clean dressing using gauze sponges, not tissues or cotton balls. Do not Riechers, Rockell E. ([id number]) scrub or use excessive force. Pat dry using gauze sponges, not tissue or cotton balls. Peri-Wound Care Topical Primary Dressing Silvercel Small 2x2 (in/in) Discharge Instruction: Apply Silvercel Small 2x2 (in/in) as instructed Secondary Dressing ABD Pad 5x9 (in/in) Discharge Instruction: Cover with ABD pad Secured With Compression Wrap Profore Lite LF 3 Multilayer Compression Bandaging System Discharge Instruction: Apply 3 multi-layer wrap as prescribed. Unna w/Calamine, 4x10 (in/yd) Discharge Instruction: Apply unna paste -3 finger-widths below knee to anchor compression wrap in place. Compression Stockings Add-Ons Electronic Signature(s) Signed: 04/08/2020 6:03:28 PM By: Gretta Cool, BSN, RN, CWS, Kim RN, BSN Entered By: Gretta Cool, BSN, RN, CWS, Kim on 04/08/2020 15:56:48 Alexandria [name] ([id number]) -------------------------------------------------------------------------------- [place] Details Patient Name: Alexandria [name] Date of Service: 04/08/2020 3:45 PM Medical Record Number: [id number] Patient Account Number: [id number] Date of Birth/Sex: [birth year] (85 y.o. F) Treating RN: Cornell Barman Primary Care Diandra Cimini: Elsie Stain Other Clinician: Referring Francisco Eyerly: Elsie Stain Treating Murlene Revell/Extender: Skipper Cliche in Treatment: 4 Vital Signs Time Taken: 15:53 Temperature (F): 97.9 Height (in): 65 Pulse (bpm): 70 Weight (lbs): 220 Respiratory Rate (breaths/min): 18 Body Mass Index (BMI): 36.6 Blood Pressure (mmHg): 151/74 Reference Range: 80 - 120 mg / dl Electronic Signature(s) Signed: 04/08/2020 6:03:28 PM By: Gretta Cool, BSN, RN, CWS, Kim RN, BSN Entered By: Gretta Cool, BSN, RN, CWS, Kim on 04/08/2020 15:53:26

## 2020-04-11 ENCOUNTER — Other Ambulatory Visit: Payer: Self-pay | Admitting: Cardiovascular Disease

## 2020-04-11 NOTE — Telephone Encounter (Signed)
Rx request sent to pharmacy.  

## 2020-04-13 ENCOUNTER — Other Ambulatory Visit: Payer: Self-pay | Admitting: Family Medicine

## 2020-04-15 ENCOUNTER — Other Ambulatory Visit: Payer: Self-pay

## 2020-04-15 ENCOUNTER — Encounter: Payer: Medicare Other | Admitting: Physician Assistant

## 2020-04-15 DIAGNOSIS — I110 Hypertensive heart disease with heart failure: Secondary | ICD-10-CM | POA: Diagnosis not present

## 2020-04-15 DIAGNOSIS — I872 Venous insufficiency (chronic) (peripheral): Secondary | ICD-10-CM | POA: Diagnosis not present

## 2020-04-15 DIAGNOSIS — F4323 Adjustment disorder with mixed anxiety and depressed mood: Secondary | ICD-10-CM | POA: Diagnosis not present

## 2020-04-15 DIAGNOSIS — I5042 Chronic combined systolic (congestive) and diastolic (congestive) heart failure: Secondary | ICD-10-CM | POA: Diagnosis not present

## 2020-04-15 DIAGNOSIS — E1142 Type 2 diabetes mellitus with diabetic polyneuropathy: Secondary | ICD-10-CM | POA: Diagnosis not present

## 2020-04-15 DIAGNOSIS — E1151 Type 2 diabetes mellitus with diabetic peripheral angiopathy without gangrene: Secondary | ICD-10-CM | POA: Diagnosis not present

## 2020-04-15 DIAGNOSIS — E11622 Type 2 diabetes mellitus with other skin ulcer: Secondary | ICD-10-CM | POA: Diagnosis not present

## 2020-04-15 DIAGNOSIS — I890 Lymphedema, not elsewhere classified: Secondary | ICD-10-CM | POA: Diagnosis not present

## 2020-04-15 DIAGNOSIS — L97812 Non-pressure chronic ulcer of other part of right lower leg with fat layer exposed: Secondary | ICD-10-CM | POA: Diagnosis not present

## 2020-04-15 NOTE — Progress Notes (Addendum)
Alexandria [name] ([id number]) Visit Report for 04/15/2020 Chief Complaint Document Details Patient Name: Alexandria [name] Date of Service: 04/15/2020 11:15 AM Medical Record Number: [id number] Patient Account Number: [id number] Date of Birth/Sex: Jul 06, 1935 (85 y.o. F) Treating RN: Dolan Amen Primary Care Provider: Elsie Stain Other Clinician: Referring Provider: Elsie Stain Treating Provider/Extender: Skipper Cliche in Treatment: 5 Information Obtained from: Patient Chief Complaint Right LE Ulcer Electronic Signature(s) Signed: 04/15/2020 11:25:21 AM By: Worthy Keeler PA-C Entered By: Worthy Keeler on 04/15/2020 11:25:20 Alexandria [name] ([id number]) -------------------------------------------------------------------------------- HPI Details Patient Name: Alexandria [name] Date of Service: 04/15/2020 11:15 AM Medical Record Number: [id number] Patient Account Number: [id number] Date of Birth/Sex: 04-01-1935 (85 y.o. F) Treating RN: Dolan Amen Primary Care Provider: Elsie Stain Other Clinician: Referring Provider: Elsie Stain Treating Provider/Extender: Skipper Cliche in Treatment: 5 History of Present Illness HPI Description: 85 year old diabetic patient who recently was seen in the ER for a knee laceration which was closed with staples and then Steri-Stripped. She then developed a cellulitis on her right lower extremity and had to be treated with Septra and was previously on Keflex. She was asked to use a leg immobilizer and limited range of motion to 45o at the knee. The patient is known to have a past medical history of depression, hypothyroidism, hypertension, diastolic CHF, hyperlipidemia, diabetes mellitus, peripheral neuropathy. she is being treated by her pulmonologist for COPD with emphysema with excarcebation and was given a course of prednisone recently. She also was asked to use a CPAP machine and other symptomatic treatment was given. Last  hemoglobin A1c was 5.8% 2 months ago. she used to be a heavy smoker but has quit about 11 years ago. 11/04/2016 -- the patient had a lower extremity venous duplex reflux evaluation done on 11/03/2016 and this showed no evidence of acute bilateral lower extremity deep vein thrombosis. She had reflux in bilateral common femoral veins and there was reflux in the bilateral saphenofemoral junction. However they were unable to obtain reflux in the bilateral great saphenous veins of the small saphenous veins and it was a difficult examination technically. [place] wanted some advice about wound management while they were to be 11/12/2016 -- she returns after a vacation but has a new injury to her right lateral calf which she lacerated on a blunt object 11/25/2016 -- the patient still has not gotten in touch with her cardiologist regarding her diuretics and she continues to have significant lymphedema right lower extremity was then left Readmission: 03/10/2020 upon evaluation today patient appears to be doing somewhat poorly in regard to her right lower extremity as a result of having dropped a 40 ounce jar of peanut butter on her leg about a month ago causing a traumatic injury. She does have significant swelling and even some very mild erythema she has been on Keflex for urinary tract infection and tells me that really the wound has improved significantly during the time she is been on the Keflex. Fortunately there is no signs of active infection at this time. No fevers, chills, nausea, vomiting, or diarrhea. The patient does have a history of chronic venous insufficiency, lymphedema, diabetes mellitus type 2, congestive heart failure, and COPD. 03/17/2020 upon evaluation today patient appears to be doing well in regard to her wound currently. Fortunately there is no sign of active infection at this time. I do believe that the leg ulcer is making good progress. This  is  still a wound that I think would benefit from continued wrapping she really did want to try to discontinue the wrapping if at all possible but again I feel like that would be ideal. I did leave it open to discussion and in the end that the patient decided she would like to do what is best to try to get this healed as quickly as possible. Therefore we can continue to wrap the leg at this time she is in agreement with that plan. 03/25/2020 upon evaluation today patient appears to be doing well at this point in regard to her wound. In fact this is measuring significantly smaller leading to skin around the edges. There does not appear to be any signs of active infection which is great news and overall I am extremely pleased with where things stand at this time. No fevers, chills, nausea, vomiting, or diarrhea. 04/11/2020 upon evaluation today patient appears to actually be doing quite well with regard to her wound. This is measuring smaller and overall seems to be making good progress. She is not completely healed but she is very [name]. Fortunately there is no evidence of active infection no need for sharp debridement today. 04/15/2020 upon evaluation today patient appears to be doing excellent currently in regard to her leg ulcer. In fact it appears to be completely healed based on what I am seeing today. I do not see any signs of infection and overall I think she is definitely ready for discharge. Electronic Signature(s) Signed: 04/15/2020 1:32:49 PM By: Worthy Keeler PA-C Entered By: Worthy Keeler on 04/15/2020 13:32:49 Alexandria [name] ([id number]) -------------------------------------------------------------------------------- Physical Exam Details Patient Name: Alexandria [name] Date of Service: 04/15/2020 11:15 AM Medical Record Number: [id number] Patient Account Number: [id number] Date of Birth/Sex: 01/21/1936 (85 y.o. F) Treating RN: Dolan Amen Primary Care Provider: Elsie Stain Other  Clinician: Referring Provider: Elsie Stain Treating Provider/Extender: Jeri Cos Weeks in Treatment: 5 Constitutional Well-nourished and well-hydrated in no acute distress. Respiratory normal breathing without difficulty. Psychiatric this patient is able to make decisions and demonstrates good insight into disease process. Alert and Oriented x 3. pleasant and cooperative. Notes Patient's wound currently showed signs of excellent epithelization and very pleased in that regard. There does not appear to be any signs of active infection at this time. Electronic Signature(s) Signed: 04/15/2020 1:33:14 PM By: Worthy Keeler PA-C Entered By: Worthy Keeler on 04/15/2020 13:33:13 Alexandria [name] ([id number]) -------------------------------------------------------------------------------- Physician Orders Details Patient Name: Alexandria [name] Date of Service: 04/15/2020 11:15 AM Medical Record Number: [id number] Patient Account Number: [id number] Date of Birth/Sex: [birth year] (84 y.o. F) Treating RN: Carlene Coria Primary Care Provider: Elsie Stain Other Clinician: Referring Provider: Elsie Stain Treating Provider/Extender: Skipper Cliche in Treatment: 5 Verbal / Phone Orders: No Diagnosis Coding ICD-10 Coding Code Description I87.2 Venous insufficiency (chronic) (peripheral) I89.0 Lymphedema, not elsewhere classified L97.812 Non-pressure chronic ulcer of other part of right lower leg with fat layer exposed E11.622 Type 2 diabetes mellitus with other skin ulcer I50.42 Chronic combined systolic (congestive) and diastolic (congestive) heart failure J44.9 Chronic obstructive pulmonary disease, unspecified Discharge From [facility] o Discharge from [facility] Complete Wound Treatment Electronic Signature(s) Signed: 04/15/2020 6:05:44 PM By: Worthy Keeler PA-C Signed: 04/16/2020 9:44:48 AM By: Carlene Coria RN Entered By: Carlene Coria on 04/15/2020  11:30:36 Alexandria [name] ([id number]) -------------------------------------------------------------------------------- Problem List Details Patient Name: Alexandria [name] Date of Service: 04/15/2020 11:15 AM Medical Record Number: [id number] Patient Account Number:  [id number] Date of Birth/Sex: 1935-04-27 (84 y.o. F) Treating RN: Dolan Amen Primary Care Provider: Elsie Stain Other Clinician: Referring Provider: Elsie Stain Treating Provider/Extender: Skipper Cliche in Treatment: 5 Active Problems ICD-10 Encounter Code Description Active Date MDM Diagnosis I87.2 Venous insufficiency (chronic) (peripheral) 03/10/2020 No Yes I89.0 Lymphedema, not elsewhere classified 03/10/2020 No Yes L97.812 Non-pressure chronic ulcer of other part of right lower leg with fat layer 03/10/2020 No Yes exposed E11.622 Type 2 diabetes mellitus with other skin ulcer 03/10/2020 No Yes I50.42 Chronic combined systolic (congestive) and diastolic (congestive) heart 03/10/2020 No Yes failure J44.9 Chronic obstructive pulmonary disease, unspecified 03/10/2020 No Yes Inactive Problems Resolved Problems Electronic Signature(s) Signed: 04/15/2020 11:25:15 AM By: Worthy Keeler PA-C Entered By: Worthy Keeler on 04/15/2020 11:25:15 Alexandria [name] ([id number]) -------------------------------------------------------------------------------- Progress Note Details Patient Name: Alexandria [name] Date of Service: 04/15/2020 11:15 AM Medical Record Number: [id number] Patient Account Number: [id number] Date of Birth/Sex: 06/16/1935 (84 y.o. F) Treating RN: Dolan Amen Primary Care Provider: Elsie Stain Other Clinician: Referring Provider: Elsie Stain Treating Provider/Extender: Skipper Cliche in Treatment: 5 Subjective Chief Complaint Information obtained from Patient Right LE Ulcer History of Present Illness (HPI) 85 year old diabetic patient who recently was seen in the ER for a knee laceration  which was closed with staples and then Steri-Stripped. She then developed a cellulitis on her right lower extremity and had to be treated with Septra and was previously on Keflex. She was asked to use a leg immobilizer and limited range of motion to 45 at the knee. The patient is known to have a past medical history of depression, hypothyroidism, hypertension, diastolic CHF, hyperlipidemia, diabetes mellitus, peripheral neuropathy. she is being treated by her pulmonologist for COPD with emphysema with excarcebation and was given a course of prednisone recently. She also was asked to use a CPAP machine and other symptomatic treatment was given. Last hemoglobin A1c was 5.8% 2 months ago. she used to be a heavy smoker but has quit about 11 years ago. 11/04/2016 -- the patient had a lower extremity venous duplex reflux evaluation done on 11/03/2016 and this showed no evidence of acute bilateral lower extremity deep vein thrombosis. She had reflux in bilateral common femoral veins and there was reflux in the bilateral saphenofemoral junction. However they were unable to obtain reflux in the bilateral great saphenous veins of the small saphenous veins and it was a difficult examination technically. [place] and wanted some advice about wound management while they were to be 11/12/2016 -- she returns after a vacation but has a new injury to her right lateral calf which she lacerated on a blunt object 11/25/2016 -- the patient still has not gotten in touch with her cardiologist regarding her diuretics and she continues to have significant lymphedema right lower extremity was then left Readmission: 03/10/2020 upon evaluation today patient appears to be doing somewhat poorly in regard to her right lower extremity as a result of having dropped a 40 ounce jar of peanut butter on her leg about a month ago causing a traumatic injury. She does have significant swelling and  even some very mild erythema she has been on Keflex for urinary tract infection and tells me that really the wound has improved significantly during the time she is been on the Keflex. Fortunately there is no signs of active infection at this time. No fevers, chills, nausea, vomiting, or diarrhea. The patient does have a history of  chronic venous insufficiency, lymphedema, diabetes mellitus type 2, congestive heart failure, and COPD. 03/17/2020 upon evaluation today patient appears to be doing well in regard to her wound currently. Fortunately there is no sign of active infection at this time. I do believe that the leg ulcer is making good progress. This is still a wound that I think would benefit from continued wrapping she really did want to try to discontinue the wrapping if at all possible but again I feel like that would be ideal. I did leave it open to discussion and in the end that the patient decided she would like to do what is best to try to get this healed as quickly as possible. Therefore we can continue to wrap the leg at this time she is in agreement with that plan. 03/25/2020 upon evaluation today patient appears to be doing well at this point in regard to her wound. In fact this is measuring significantly smaller leading to skin around the edges. There does not appear to be any signs of active infection which is great news and overall I am extremely pleased with where things stand at this time. No fevers, chills, nausea, vomiting, or diarrhea. 04/11/2020 upon evaluation today patient appears to actually be doing quite well with regard to her wound. This is measuring smaller and overall seems to be making good progress. She is not completely healed but she is very [name]. Fortunately there is no evidence of active infection no need for sharp debridement today. 04/15/2020 upon evaluation today patient appears to be doing excellent currently in regard to her leg ulcer. In fact it appears to be  completely healed based on what I am seeing today. I do not see any signs of infection and overall I think she is definitely ready for discharge. Objective Constitutional Well-nourished and well-hydrated in no acute distress. Vitals Time Taken: 11:15 AM, Height: 65 in, Weight: 220 lbs, BMI: 36.6, Temperature: 98.2 F, Pulse: 64 bpm, Respiratory Rate: 18 breaths/min, Alexandria [name], Alexandria E. ([id number]) Blood Pressure: 151/65 mmHg. Respiratory normal breathing without difficulty. Psychiatric this patient is able to make decisions and demonstrates good insight into disease process. Alert and Oriented x 3. pleasant and cooperative. General Notes: Patient's wound currently showed signs of excellent epithelization and very pleased in that regard. There does not appear to be any signs of active infection at this time. Integumentary (Hair, Skin) Wound #3 status is Open. Original cause of wound was Trauma. The wound is located on the Right,Medial Lower Leg. The wound measures 0cm length x 0cm width x 0cm depth; 0cm^2 area and 0cm^3 volume. There is Fat Layer (Subcutaneous Tissue) exposed. There is no tunneling or undermining noted. There is a medium amount of serosanguineous drainage noted. The wound margin is flat and intact. There is large (67-100%) pink granulation within the wound bed. There is a small (1-33%) amount of necrotic tissue within the wound bed including Adherent Slough. Assessment Active Problems ICD-10 Venous insufficiency (chronic) (peripheral) Lymphedema, not elsewhere classified Non-pressure chronic ulcer of other part of right lower leg with fat layer exposed Type 2 diabetes mellitus with other skin ulcer Chronic combined systolic (congestive) and diastolic (congestive) heart failure Chronic obstructive pulmonary disease, unspecified Plan Discharge From [facility]: Discharge from [facility] Complete 1. At this point I would recommend that we discontinue wound  care services as the patient appears to be completely healed this is excellent news. 2. We will have her use Tubigrip for time in order to keep  edema under control she at least needs to do this for the next 2 weeks. We will see her back for follow-up visit as needed. Electronic Signature(s) Signed: 04/15/2020 1:33:39 PM By: Worthy Keeler PA-C Entered By: Worthy Keeler on 04/15/2020 13:33:39 Alexandria [name] ([id number]) -------------------------------------------------------------------------------- SuperBill Details Patient Name: Alexandria [name] Date of Service: 04/15/2020 Medical Record Number: [id number] Patient Account Number: [id number] Date of Birth/Sex: 04-Aug-1935 (84 y.o. F) Treating RN: Carlene Coria Primary Care Provider: Elsie Stain Other Clinician: Referring Provider: Elsie Stain Treating Provider/Extender: Skipper Cliche in Treatment: 5 Diagnosis Coding ICD-10 Codes Code Description I87.2 Venous insufficiency (chronic) (peripheral) I89.0 Lymphedema, not elsewhere classified L97.812 Non-pressure chronic ulcer of other part of right lower leg with fat layer exposed E11.622 Type 2 diabetes mellitus with other skin ulcer I50.42 Chronic combined systolic (congestive) and diastolic (congestive) heart failure J44.9 Chronic obstructive pulmonary disease, unspecified Facility Procedures CPT4 Code: [id number] Description: [id number] - WOUND CARE VISIT-LEV 2 EST PT Modifier: Quantity: 1 Physician Procedures CPT4 Code: [id number] Description: 68127 - WC PHYS LEVEL 3 - EST PT Modifier: Quantity: 1 CPT4 Code: Description: ICD-10 Diagnosis Description I87.2 Venous insufficiency (chronic) (peripheral) I89.0 Lymphedema, not elsewhere classified L97.812 Non-pressure chronic ulcer of other part of right lower leg with fat la E11.622 Type 2 diabetes mellitus with  other skin ulcer Modifier: yer exposed Quantity: Electronic Signature(s) Signed: 04/15/2020 1:33:58 PM By: Worthy Keeler  PA-C Entered By: Worthy Keeler on 04/15/2020 13:33:58

## 2020-04-16 NOTE — Progress Notes (Signed)
LANITRA, BATTAGLINI ([id number]) Visit Report for 04/15/2020 Arrival Information Details Patient Name: Alexandria [name], Alexandria [name] Date of Service: 04/15/2020 11:15 AM Medical Record Number: [id number] Patient Account Number: [id number] Date of Birth/Sex: [birth year] (85 y.o. F) Treating RN: Carlene Coria Primary Care Joyclyn Plazola: Elsie Stain Other Clinician: Referring Annette Bertelson: Elsie Stain Treating Chaise Mahabir/Extender: Skipper Cliche in Treatment: 5 Visit Information History Since Last Visit All ordered tests and consults were completed: No Patient Arrived: [name] Added or deleted any medications: No Arrival Time: 11:11 Any new allergies or adverse reactions: No Accompanied By: self Had a fall or experienced change in No Transfer Assistance: None activities of daily living that may affect Patient Identification Verified: Yes risk of falls: Secondary Verification Process Completed: Yes Signs or symptoms of abuse/neglect since last visito No Patient Requires Transmission-Based Precautions: No Hospitalized since last visit: No Patient Has Alerts: No Implantable device outside of the clinic excluding No cellular tissue based products placed in the center since last visit: Has Dressing in Place as Prescribed: Yes Has Compression in Place as Prescribed: Yes Pain Present Now: No Electronic Signature(s) Signed: 04/16/2020 9:44:48 AM By: Carlene Coria RN Entered By: Carlene Coria on 04/15/2020 11:15:36 Alexandria [name] ([id number]) -------------------------------------------------------------------------------- Clinic Level of Care Assessment Details Patient Name: Alexandria [name] Date of Service: 04/15/2020 11:15 AM Medical Record Number: [id number] Patient Account Number: [id number] Date of Birth/Sex: [birth year] (85 y.o. F) Treating RN: Carlene Coria Primary Care Shawnee Gambone: Elsie Stain Other Clinician: Referring Rasul Decola: Elsie Stain Treating Jeromiah Ohalloran/Extender: Skipper Cliche in Treatment:  5 Clinic Level of Care Assessment Items TOOL 4 Quantity Score X - Use when only an EandM is performed on FOLLOW-UP visit 1 0 ASSESSMENTS - Nursing Assessment / Reassessment X - Reassessment of Co-morbidities (includes updates in patient status) 1 10 X- 1 5 Reassessment of Adherence to Treatment Plan ASSESSMENTS - Wound and Skin Assessment / Reassessment X - Simple Wound Assessment / Reassessment - one wound 1 5 []  - 0 Complex Wound Assessment / Reassessment - multiple wounds []  - 0 Dermatologic / Skin Assessment (not related to wound area) ASSESSMENTS - Focused Assessment []  - Circumferential Edema Measurements - multi extremities 0 []  - 0 Nutritional Assessment / Counseling / Intervention []  - 0 Lower Extremity Assessment (monofilament, tuning fork, pulses) []  - 0 Peripheral Arterial Disease Assessment (using hand held doppler) ASSESSMENTS - Ostomy and/or Continence Assessment and Care []  - Incontinence Assessment and Management 0 []  - 0 Ostomy Care Assessment and Management (repouching, etc.) PROCESS - Coordination of Care X - Simple Patient / Family Education for ongoing care 1 15 []  - 0 Complex (extensive) Patient / Family Education for ongoing care X- 1 10 Staff obtains Programmer, systems, Records, Test Results / Process Orders []  - 0 Staff telephones HHA, Nursing Homes / Clarify orders / etc []  - 0 Routine Transfer to another Facility (non-emergent condition) []  - 0 Routine Hospital Admission (non-emergent condition) []  - 0 New Admissions / Biomedical engineer / Ordering NPWT, Apligraf, etc. []  - 0 Emergency Hospital Admission (emergent condition) X- 1 10 Simple Discharge Coordination []  - 0 Complex (extensive) Discharge Coordination PROCESS - Special Needs []  - Pediatric / Minor Patient Management 0 []  - 0 Isolation Patient Management []  - 0 Hearing / Language / Visual special needs []  - 0 Assessment of Community assistance (transportation, D/C planning,  etc.) []  - 0 Additional assistance / Altered mentation []  - 0 Support Surface(s) Assessment (bed, cushion, seat, etc.) INTERVENTIONS - Wound Cleansing / Measurement Dario, Kyarah E. ([id number])  X- 1 5 Simple Wound Cleansing - one wound []  - 0 Complex Wound Cleansing - multiple wounds X- 1 5 Wound Imaging (photographs - any number of wounds) []  - 0 Wound Tracing (instead of photographs) X- 1 5 Simple Wound Measurement - one wound []  - 0 Complex Wound Measurement - multiple wounds INTERVENTIONS - Wound Dressings []  - Small Wound Dressing one or multiple wounds 0 []  - 0 Medium Wound Dressing one or multiple wounds []  - 0 Large Wound Dressing one or multiple wounds []  - 0 Application of Medications - topical []  - 0 Application of Medications - injection INTERVENTIONS - Miscellaneous []  - External ear exam 0 []  - 0 Specimen Collection (cultures, biopsies, blood, body fluids, etc.) []  - 0 Specimen(s) / Culture(s) sent or taken to Lab for analysis []  - 0 Patient Transfer (multiple staff / Civil Service fast streamer / Similar devices) []  - 0 Simple Staple / Suture removal (25 or less) []  - 0 Complex Staple / Suture removal (26 or more) []  - 0 Hypo / Hyperglycemic Management ([name] monitor of Blood Glucose) []  - 0 Ankle / Brachial Index (ABI) - do not check if billed separately X- 1 5 Vital Signs Has the patient been seen at the hospital within the last three years: Yes Total Score: 75 Level Of Care: New/Established - Level 2 Electronic Signature(s) Signed: 04/16/2020 9:44:48 AM By: Carlene Coria RN Entered By: Carlene Coria on 04/15/2020 11:32:39 Alexandria [name] ([id number]) -------------------------------------------------------------------------------- Encounter Discharge Information Details Patient Name: Alexandria [name] Date of Service: 04/15/2020 11:15 AM Medical Record Number: [id number] Patient Account Number: [id number] Date of Birth/Sex: [birth year] (85 y.o. F) Treating RN:  Carlene Coria Primary Care Elycia Woodside: Elsie Stain Other Clinician: Referring Crockett Rallo: Elsie Stain Treating Julanne Schlueter/Extender: Skipper Cliche in Treatment: 5 Encounter Discharge Information Items Discharge Condition: Stable Ambulatory Status: Walker Discharge Destination: Home Transportation: Other Accompanied By: self Schedule Follow-up Appointment: Yes Clinical Summary of Care: Patient Declined Electronic Signature(s) Signed: 04/16/2020 9:44:48 AM By: Carlene Coria RN Entered By: Carlene Coria on 04/15/2020 11:33:38 Alexandria [name] ([id number]) -------------------------------------------------------------------------------- Lower Extremity Assessment Details Patient Name: Alexandria [name] Date of Service: 04/15/2020 11:15 AM Medical Record Number: [id number] Patient Account Number: [id number] Date of Birth/Sex: 15-Nov-1935 (84 y.o. F) Treating RN: Carlene Coria Primary Care Parth Mccormac: Elsie Stain Other Clinician: Referring Ventura Hollenbeck: Elsie Stain Treating Alexus Michael/Extender: Jeri Cos Weeks in Treatment: 5 Edema Assessment Assessed: [name]: No] [Right: No] [Left: Edema] [Right: :] Calf Left: Right: Point of Measurement: From Medial Instep 46 cm Ankle Left: Right: Point of Measurement: From Medial Instep 25.6 cm Electronic Signature(s) Signed: 04/16/2020 9:44:48 AM By: Carlene Coria RN Entered By: Carlene Coria on 04/15/2020 11:23:08 Alexandria [name] ([id number]) -------------------------------------------------------------------------------- Multi Wound Chart Details Patient Name: Alexandria [name] Date of Service: 04/15/2020 11:15 AM Medical Record Number: [id number] Patient Account Number: [id number] Date of Birth/Sex: May 19, 1935 (84 y.o. 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 Comorbid History: Anemia, Chronic Obstructive N/A N/A Pulmonary Disease (COPD), Sleep Apnea, Congestive Heart Failure, Hypertension, Type II Diabetes, Neuropathy Date Acquired: 01/31/2020 N/A N/A Weeks of Treatment: 5 N/A N/A Wound Status: Open N/A N/A Measurements L  x W x D (cm) 0x0x0 N/A N/A Area (cm) : 0 N/A N/A Volume (cm) : 0 N/A N/A % Reduction in Area: 100.00% N/A N/A % Reduction in Volume: 100.00% N/A N/A Classification: Grade 2 N/A N/A Exudate Amount: Medium N/A N/A Exudate Type: Serosanguineous N/A N/A Exudate Color: red, brown N/A N/A Wound Margin: Flat and Intact N/A N/A Granulation Amount: Large (67-100%) N/A N/A Granulation Quality: Pink N/A N/A Necrotic Amount: Small (1-33%) N/A N/A Exposed Structures: Fat Layer (Subcutaneous Tissue): N/A N/A Yes Fascia: No Tendon: No Muscle: No Joint: No Bone: No Epithelialization: None N/A N/A Treatment Notes Electronic Signature(s) Signed: 04/16/2020 9:44:48 AM By: Carlene Coria RN Entered By: Carlene Coria on 04/15/2020 11:29:59 Alexandria [name] ([name] ([id number]) -------------------------------------------------------------------------------- [place] Details Patient Name: Alexandria [name] Date of Service: 04/15/2020 11:15 AM Medical Record Number: [id number] Patient Account Number: [id number] Date of Birth/Sex: 06-Jun-1935 (84 y.o. F) Treating RN: Carlene Coria Primary Care Taylie Helder: Elsie Stain Other Clinician: Referring Surie Suchocki: Elsie Stain Treating Anthon Harpole/Extender: Jeri Cos Weeks in Treatment: 5 Active Inactive Electronic Signature(s) Signed: 04/16/2020 9:44:48  AM By: Carlene Coria RN Entered By: Carlene Coria on 04/15/2020 11:29:34 Alexandria [name] ([id number]) -------------------------------------------------------------------------------- Pain Assessment Details Patient Name: Alexandria [name] Date of Service: 04/15/2020 11:15 AM Medical Record Number: [id number] Patient Account Number: [id number] Date of Birth/Sex: [birth year] (84 y.o. F) Treating RN: Carlene Coria Primary Care Elany Felix: Elsie Stain Other Clinician: Referring Osceola Holian: Elsie Stain Treating Kleber Crean/Extender: Skipper Cliche in Treatment: 5 Active Problems Location of Pain Severity and Description of Pain Patient Has Paino No Site Locations Pain Management and Medication Current Pain Management: Electronic Signature(s) Signed: 04/16/2020 9:44:48 AM By: Carlene Coria RN Entered By: Carlene Coria on 04/15/2020 11:16:03 Alexandria [name] ([id number]) -------------------------------------------------------------------------------- Patient/Caregiver Education Details Patient Name: Alexandria [name] Date of Service: 04/15/2020 11:15 AM Medical Record Number: [id number] Patient Account Number: [id number] Date of Birth/Gender: [birth year] (84 y.o. F) Treating RN: Carlene Coria Primary Care Physician: Elsie Stain Other Clinician: Referring Physician: Elsie Stain Treating Physician/Extender: Skipper Cliche in Treatment: 5 Education Assessment Education Provided To: Patient Education Topics Provided Wound/Skin Impairment: Methods: Explain/Verbal Responses: State content correctly Electronic Signature(s) Signed: 04/16/2020 9:44:48 AM By: Carlene Coria RN Entered By: Carlene Coria on 04/15/2020 11:33:04 Alexandria [name] ([id number]) -------------------------------------------------------------------------------- Wound Assessment Details Patient Name: Alexandria [name] Date of Service: 04/15/2020 11:15 AM Medical Record Number: [id number] Patient Account Number:  [id number] Date of Birth/Sex: [birth year] (84 y.o. F) Treating RN: Carlene Coria Primary Care Dejean Tribby: Elsie Stain Other Clinician: Referring Elizet Kaplan: Elsie Stain Treating Fremon Zacharia/Extender: Jeri Cos Weeks in Treatment: 5 Wound Status Wound Number: 3 Primary Diabetic Wound/Ulcer of the Lower Extremity Etiology: Wound Location: Right, Medial Lower Leg Wound Open Wounding Event: Trauma Status: Date Acquired: 01/31/2020 Comorbid Anemia, Chronic Obstructive Pulmonary Disease (COPD), Weeks Of Treatment: 5 History: Sleep Apnea, Congestive Heart Failure, Hypertension, Clustered Wound: No Type II Diabetes, Neuropathy Photos Wound Measurements Length: (cm) 0 Width: (cm) 0 Depth: (cm) 0 Area: (cm) 0 Volume: (cm) 0 % Reduction in Area: 100% % Reduction in Volume: 100% Epithelialization: None Tunneling: No Undermining: No Wound Description Classification: Grade 2 Wound Margin: Flat and Intact Exudate Amount: Medium Exudate Type: Serosanguineous Exudate Color: red, brown Foul Odor After Cleansing: No Slough/Fibrino Yes Wound Bed Granulation Amount: Large (67-100%) Exposed Structure Granulation Quality: Pink Fascia Exposed: No Necrotic Amount: Small (1-33%) Fat Layer (Subcutaneous Tissue) Exposed: Yes Necrotic Quality: Adherent Slough Tendon Exposed: No Muscle Exposed: No Joint Exposed: No  Bone Exposed: No Electronic Signature(s) Signed: 04/16/2020 9:44:48 AM By: Carlene Coria RN Entered By: Carlene Coria on 04/15/2020 11:22:39 Alexandria [name] ([id number]) -------------------------------------------------------------------------------- [place] Details Patient Name: Alexandria [name] Date of Service: 04/15/2020 11:15 AM Medical Record Number: [id number] Patient Account Number: [id number] Date of Birth/Sex: 07/19/1935 (84 y.o. F) Treating RN: Carlene Coria Primary Care Nelda Luckey: Elsie Stain Other Clinician: Referring Falen Lehrmann: Elsie Stain Treating  Matheau Orona/Extender: Skipper Cliche in Treatment: 5 Vital Signs Time Taken: 11:15 Temperature (F): 98.2 Height (in): 65 Pulse (bpm): 64 Weight (lbs): 220 Respiratory Rate (breaths/min): 18 Body Mass Index (BMI): 36.6 Blood Pressure (mmHg): 151/65 Reference Range: 80 - 120 mg / dl Electronic Signature(s) Signed: 04/16/2020 9:44:48 AM By: Carlene Coria RN Entered By: Carlene Coria on 04/15/2020 11:15:57

## 2020-04-25 ENCOUNTER — Ambulatory Visit: Payer: Medicare Other | Admitting: Pulmonary Disease

## 2020-04-25 ENCOUNTER — Encounter: Payer: Self-pay | Admitting: Internal Medicine

## 2020-04-29 ENCOUNTER — Ambulatory Visit (INDEPENDENT_AMBULATORY_CARE_PROVIDER_SITE_OTHER): Payer: Medicare Other | Admitting: Internal Medicine

## 2020-04-29 ENCOUNTER — Other Ambulatory Visit: Payer: Self-pay

## 2020-04-29 ENCOUNTER — Encounter: Payer: Self-pay | Admitting: Internal Medicine

## 2020-04-29 VITALS — BP 138/88 | HR 78 | Ht 65.0 in | Wt 220.6 lb

## 2020-04-29 DIAGNOSIS — E785 Hyperlipidemia, unspecified: Secondary | ICD-10-CM | POA: Diagnosis not present

## 2020-04-29 DIAGNOSIS — E039 Hypothyroidism, unspecified: Secondary | ICD-10-CM

## 2020-04-29 DIAGNOSIS — E1140 Type 2 diabetes mellitus with diabetic neuropathy, unspecified: Secondary | ICD-10-CM | POA: Diagnosis not present

## 2020-04-29 DIAGNOSIS — D649 Anemia, unspecified: Secondary | ICD-10-CM

## 2020-04-29 DIAGNOSIS — F4323 Adjustment disorder with mixed anxiety and depressed mood: Secondary | ICD-10-CM | POA: Diagnosis not present

## 2020-04-29 LAB — CBC WITH DIFFERENTIAL/PLATELET
Basophils Absolute: 0 10*3/uL (ref 0.0–0.1)
Basophils Relative: 0.6 % (ref 0.0–3.0)
Eosinophils Absolute: 0.1 10*3/uL (ref 0.0–0.7)
Eosinophils Relative: 1.6 % (ref 0.0–5.0)
HCT: 32.4 % — ABNORMAL LOW (ref 36.0–46.0)
Hemoglobin: 11 g/dL — ABNORMAL LOW (ref 12.0–15.0)
Lymphocytes Relative: 21.6 % (ref 12.0–46.0)
Lymphs Abs: 1.1 10*3/uL (ref 0.7–4.0)
MCHC: 33.8 g/dL (ref 30.0–36.0)
MCV: 91.1 fl (ref 78.0–100.0)
Monocytes Absolute: 0.3 10*3/uL (ref 0.1–1.0)
Monocytes Relative: 6.8 % (ref 3.0–12.0)
Neutro Abs: 3.5 10*3/uL (ref 1.4–7.7)
Neutrophils Relative %: 69.4 % (ref 43.0–77.0)
Platelets: 179 10*3/uL (ref 150.0–400.0)
RBC: 3.56 Mil/uL — ABNORMAL LOW (ref 3.87–5.11)
RDW: 14.9 % (ref 11.5–15.5)
WBC: 5 10*3/uL (ref 4.0–10.5)

## 2020-04-29 LAB — POCT GLYCOSYLATED HEMOGLOBIN (HGB A1C): Hemoglobin A1C: 5.1 % (ref 4.0–5.6)

## 2020-04-29 LAB — IRON: Iron: 51 ug/dL (ref 42–145)

## 2020-04-29 LAB — TSH: TSH: 0.8 u[IU]/mL (ref 0.35–4.50)

## 2020-04-29 LAB — T4, FREE: Free T4: 1.3 ng/dL (ref 0.60–1.60)

## 2020-04-29 MED ORDER — METFORMIN HCL 500 MG PO TABS
500.0000 mg | ORAL_TABLET | Freq: Every day | ORAL | 3 refills | Status: DC
Start: 2020-04-29 — End: 2021-07-31

## 2020-04-29 NOTE — Patient Instructions (Addendum)
Please continue Synthroid 150 mcg daily.  Take the thyroid hormone every day, with water, at least 30 minutes before breakfast, separated by at least 4 hours from: - acid reflux medications - calcium - iron - multivitamins  Please continue: - Metformin 500 mg with supper  Targets Blood sugars: - before meals: 80-130 - after meals (2h after): <180 - HbA1c <7%  Please stop at the lab.  Please return in 6 months with your sugar log.

## 2020-04-29 NOTE — Progress Notes (Signed)
Patient ID: Alexandria [name], female   DOB: Jul 31, 1935, 85 y.o.   MRN: [id number]  This visit occurred during the SARS-CoV-2 public health emergency.  Safety protocols were in place, including screening questions prior to the visit, additional usage of staff PPE, and extensive cleaning of exam room while observing appropriate contact time as indicated for disinfecting solutions.   HPI: Alexandria [name] is a 85 y.o.-year-old female, returning for follow-up for DM2, dx in 2006, non-insulin-dependent, controlled, with complications (mild CKD) and uncontrolled hypothyroidism. Last visit 6 months ago.  She was diagnosed with lung cancer before last visit.  She had radiotherapy.  At last visit, she was remodeling her house >> building a porch and an Media planner.  DM2: Reviewed HbA1c levels: Lab Results  Component Value Date   HGBA1C 5.3 10/31/2019   HGBA1C 5.4 05/02/2019   HGBA1C 5.2 11/01/2018   Pt is on: - Metformin 500 mg with dinner She was on Januvia >> stopped in summer 2016, when she was admitted for sepsis. We did not restart afterwards as her sugars remained controlled.  Pt is checking sugars once a day per review of her meter downloads: - am: 118, 124 >> 80s-100s >> 116, 122 >> 97-107 - 2h after brunch:  124-195 >> n/c >> 175 >> n/c - before lunch: 103, 109 >> n/c - before dinner: 114-152 >> n/c >> 125 >> 105 - 2h after dinner:  130s >> 96-158 >> n/c >> up to 180 - bedtime: 124, 147 >> n/c >> 133 >> n/c - nighttime: n/c >> 169, 175 >> n/c Lowest sugar was 80 >> 91 >> 97; she has hypoglycemia awareness in the 60s Highest sugar was 280 >> 158 (sweets) >> 180-200 (banana, potoes) .  Glucometer:One Touch Ultra mini  Pt's meals are:  - Breakfast: protein drink, egg, cereals >> cottage cheese, pineapple, deviled egg + tomato or fruit - Lunch: sandwich - Dinner: meat + 2 veggies - Snacks: 1-3 peanut butter, milk, crackers  -+ Mild CKD, last BUN/creatinine:  Lab Results  Component  Value Date   BUN 22 01/25/2020   CREATININE 0.86 01/25/2020  On valsartan. -+ HL; last set of lipids: Lab Results  Component Value Date   CHOL 148 06/26/2019   HDL 60.00 06/26/2019   LDLCALC 69 06/26/2019   LDLDIRECT 63.0 07/29/2015   TRIG 93.0 06/26/2019   CHOLHDL 2 06/26/2019  On Crestor 10 every other day, Zetia 10 daily - last eye exam was: 02/2018: No DR, + cataract. She was supposed to have cataract sx, but postponed. Dr. Prudencio Burly. -No numbness and tingling in her feet.  Hypothyroidism. -Uncontrolled -needs Synthroid DAW due to fluctuating TFTs.  Pt is on Synthroid 150 mcg daily (increased at last OV), taken: - in am - fasting - at least 30 min from b'fast - + calcium, iron, multivitamins, acid reflux medicines more than 4 hours after levothyroxine - not on Biotin  Reviewed her TFTs: Lab Results  Component Value Date   TSH 0.29 (L) 01/25/2020   TSH 9.48 (H) 10/31/2019   TSH 6.56 (H) 08/30/2019   TSH 18.54 (H) 05/02/2019   TSH 41.98 (H) 11/01/2018   TSH 3.83 12/22/2017   TSH 0.80 02/09/2017   TSH 0.93 01/01/2016   TSH 1.75 03/14/2015   TSH 2.83 01/24/2015   TSH 0.18 (L) 12/04/2014   TSH 0.55 10/18/2014   TSH 0.341 (L) 09/29/2014   TSH 0.26 (L) 10/02/2010   TSH 0.34 02/19/2010   TSH 2.90 10/11/2007  TSH 5.56 (H) 01/12/2007   Pt denies: - feeling nodules in neck - hoarseness - dysphagia - choking - SOB with lying down  She has a h/o COPD- Dr. Halford Chessman, HTN, anemia, GERD. She fell in 09/2015 >> hurt R leg (had many stitches) and R orbit. She was in rehab afterwards.  ROS: Constitutional: no weight gain/no weight loss, no fatigue, no subjective hyperthermia, no subjective hypothermia Eyes: no blurry vision, no xerophthalmia ENT: no sore throat, + see HPI Cardiovascular: no CP/no SOB/no palpitations/no leg swelling Respiratory: no cough/no SOB/no wheezing Gastrointestinal: no N/no V/no D/no C/no acid reflux Musculoskeletal: no muscle aches/no joint  aches Skin: no rashes, no hair loss Neurological: no tremors/no numbness/no tingling/no dizziness  I reviewed pt's medications, allergies, PMH, social hx, family hx, and changes were documented in the history of present illness. Otherwise, unchanged from my initial visit note.   Past Medical History:  Diagnosis Date  . Allergy, unspecified not elsewhere classified   . Anxiety   . Cataract   . Chronic diastolic congestive heart failure ([place])   . COPD (chronic obstructive pulmonary disease) ([place]) 03/08/1998   PFTs 12/12/2002 FEV 1 1.42 (64%) ratio 58 with no better after B2 and DLCO75%; PFTs 11/20/09 FEV1 1.50 (73%) ratio 50 no better after B2 with DLCO 62%; Hfa 50% 11/20/2009 >75%, 01/13/10 p coaching  . Depression 12/07/1998  . Diabetes mellitus type II 02/05/2006   Dr. Cruzita Lederer with endo  . Disorders of bursae and tendons in shoulder region, unspecified    Rotator cuff syndrome, right  . E. coli bacteremia   . Esophagitis   . GERD (gastroesophageal reflux disease)   . History of UTI   . Hyperlipidemia 10/04/2000  . Hypertension 03/08/1992  . Hypothyroidism 03/08/1968  . Iron deficiency anemia   . Lung nodule    radiation starts 10-08-2019  . Malignant neoplasm of right upper lobe of lung ([place]) 07/24/2019  . Obesity    NOS  . OSA (obstructive sleep apnea)    PSG 01/27/10 AHI 13, pt does not know CPAP settings  . Peripheral neuropathy    Likely due to DM per Dr. Erling Cruz  . Ventral hernia    Past Surgical History:  Procedure Laterality Date  . ABD U/S  03/19/1999   Nml x2 foci in liver  . ADENOSINE MYOVIEW  06/02/2007   Nml  . CARDIOLITE PERSANTINE  08/24/2000   Nml  . CAROTID U/S  08/24/2000   1-39% ICA stenosis  . CAROTID U/S  06/02/2007   No apprec change   . CARPAL TUNNEL RELEASE  12/1997   Right  . CESAREAN SECTION     x2 Breech/ repeat  . CHOLECYSTECTOMY  1997  . COLONOSCOPY WITH PROPOFOL N/A 02/12/2016   Procedure: COLONOSCOPY WITH PROPOFOL;  Surgeon: Milus Banister, MD;  Location: [facility];  Service: Endoscopy;  Laterality: N/A;  . CT ABD W & PELVIS WO/W CM     Abd hemangiomas of liver, 1 cm R renal cyst  . DENTAL SURGERY  2016   Implants  . DEXA  07/03/2003   Nml  . ESOPHAGOGASTRODUODENOSCOPY  12/05/1997   Nml (due to hoarseness)  . ESOPHAGOGASTRODUODENOSCOPY (EGD) WITH PROPOFOL N/A 02/12/2016   Procedure: ESOPHAGOGASTRODUODENOSCOPY (EGD) WITH PROPOFOL;  Surgeon: Milus Banister, MD;  Location: [facility];  Service: Endoscopy;  Laterality: N/A;  . GALLBLADDER SURGERY    . HERNIA REPAIR  01/24/2009   Lap Ventr w/ Lysis of adhesions (Dr. Donne Hazel)  . knee arthroscopic surgery  years ago   right  . ROTATOR CUFF REPAIR  1984   Right, [place]  . SHOULDER OPEN ROTATOR CUFF REPAIR  02/08/2012   Procedure: ROTATOR CUFF REPAIR SHOULDER OPEN;  Surgeon: Magnus Sinning, MD;  Location: [facility];  Service: Orthopedics;  Laterality: Left;  Left Shoulder Open Anterior Acrominectomy Rotator Cuff Repair Open Distal Clavicle Resection ,tissue mend graft, and repair of biceps tendon  . THUMB RELEASE  12/1997   Right  . TONSILLECTOMY    . TOTAL ABDOMINAL HYSTERECTOMY  1985   Due to dysmennorhea  . US ECHOCARDIOGRAPHY  06/02/2007   Social History   Occupational History  . Retired - self employed- Engineer, structural    Social History Main Topics  . Smoking status: Former Smoker -- 2.00 packs/day for 50 years    Types: Cigarettes    Quit date: 2007  . Smokeless tobacco: Never Used  . Alcohol Use: 1.2 oz/week    2 Standard drinks or equivalent per week     Comment: occasional  . Drug Use: No  . Sexual Activity: Not on file   Social History Narrative   Married with 2 children   Enjoys painting   Current Outpatient Medications on File Prior to Visit  Medication Sig Dispense Refill  . albuterol (PROAIR HFA) 108 (90 Base) MCG/ACT inhaler Inhale 2 puffs into the lungs every 6 (six) hours as needed for wheezing or shortness of breath.  INHALE 2 PUFFS INTO THE LUNGS EVERY 6 HOURS AS NEEDED FOR WHEEZING OR SHORTNESS OF BREATH 3 Inhaler 3  . busPIRone (BUSPAR) 5 MG tablet 15 mg 3 (three) times daily. As needed    . cariprazine (VRAYLAR) capsule Take 1.5 mg by mouth daily.    . cephALEXin (KEFLEX) 250 MG capsule Take 1 capsule (250 mg total) by mouth 2 (two) times daily. 14 capsule 0  . Cholecalciferol (VITAMIN D) 50 MCG (2000 UT) tablet Take 1 tablet (2,000 Units total) by mouth daily.    . Coenzyme Q-10 200 MG CAPS Take 200 mg by mouth daily.    . Cyanocobalamin (B-12) 5000 MCG CAPS Take 5,000 mcg by mouth daily.    Marland Kitchen EPINEPHrine (EPI-PEN) 0.3 mg/0.3 mL DEVI Inject 0.3 mg into the muscle once as needed (anaphylaxis).    Marland Kitchen esomeprazole (NEXIUM) 40 MG capsule Take 40 mg by mouth every other day.     . ezetimibe (ZETIA) 10 MG tablet TAKE 1 TABLET BY MOUTH EVERYDAY AT BEDTIME 90 tablet 1  . fexofenadine (ALLEGRA) 180 MG tablet Take 180 mg by mouth as needed.     Marland Kitchen FLUoxetine (PROZAC) 10 MG tablet Take 20 mg by mouth in the morning and at bedtime.    . fluticasone (FLONASE) 50 MCG/ACT nasal spray USE 2 SPRAYS INTO THE NOSE EVERY 12 HOURS AS NEEDED FOR STUFFY NOSE 48 mL 2  . furosemide (LASIX) 20 MG tablet TAKE 1 TABLET (20 MG TOTAL) BY MOUTH DAILY AS NEEDED FOR FLUID. 90 tablet 1  . glucose blood (ONETOUCH VERIO) test strip Use as instructed to check sugar 2-3x time daily 200 each 5  . GLUTATHIONE PO Take 5 drops by mouth in the morning and at bedtime.    . Iron, Ferrous Sulfate, 325 (65 Fe) MG TABS Take 325 mg by mouth daily. 90 tablet 3  . metFORMIN (GLUCOPHAGE) 500 MG tablet Take 1 tablet (500 mg total) by mouth daily with supper. 90 tablet 3  . Multiple Vitamins-Minerals (ZINC PO) Take by mouth.    Marland Kitchen  nitroGLYCERIN (NITROSTAT) 0.4 MG SL tablet Place 1 tablet (0.4 mg total) under the tongue every 5 (five) minutes as needed for chest pain. 25 tablet 3  . ONETOUCH DELICA LANCETS FINE MISC USE TO CHECK SUGAR 1 TIME DAILY 100 each 5  .  ONETOUCH VERIO test strip USE AS INSTRUCTED TO CHECK SUGAR 1 TIME DAILY 100 strip 8  . potassium chloride (K-DUR) 10 MEQ tablet Take 1 tablet (10 mEq total) by mouth daily as needed (Takes with furosemide doses.). 90 tablet 1  . rosuvastatin (CRESTOR) 10 MG tablet TAKE 1 TABLET (10 MG TOTAL) BY MOUTH EVERY OTHER DAY. 45 tablet 2  . Spacer/Aero Chamber [employer] Use with  inhaler as needed.  J44.9 1 each 0  . SYMBICORT 160-4.5 MCG/ACT inhaler TAKE 2 PUFFS INTO THE LUNGS TWICE A DAY 30.6 each 3  . SYNTHROID 150 MCG tablet Take 1 tablet (150 mcg total) by mouth daily before breakfast. 45 tablet 5  . valsartan (DIOVAN) 160 MG tablet TAKE ONE HALF TABLET (80 MG TOTAL) 2 TIMES DAILY. 90 tablet 1  . VITAMIN A PO Take 600 mcg by mouth.    Marland Kitchen VITAMIN D, CHOLECALCIFEROL, PO Take 15 mg by mouth.     No current facility-administered medications on file prior to visit.   Allergies  Allergen Reactions  . Antihistamines, Diphenhydramine-Type Other (See Comments)    Able to tolerate only allegra.   . Sulfonamide Derivatives Swelling    REACTION: closed throat  . Incruse Ellipta [Umeclidinium Bromide] Cough    Caused voice change and severe coughing  . Clarithromycin Other (See Comments)    REACTION: diff swallowing and mouth blisters  . Codeine Other (See Comments)    Unknown   . Lyrica [Pregabalin] Other (See Comments)    Lack of effect for neuropathy pain.    Marland Kitchen Spiriva Handihaler [Tiotropium Bromide Monohydrate] Other (See Comments)    Voice changes   Family History  Problem Relation Age of Onset  . Heart disease Mother   . Thyroid disease Mother   . Emphysema Father        One lung  . Cystic fibrosis Sister   . Hyperthyroidism Sister   . Osteoarthritis Brother   . Hyperthyroidism Brother   . Esophageal cancer Neg Hx   . Cancer Neg Hx        Head or neck  . Colon cancer Neg Hx   . Stomach cancer Neg Hx   . Breast cancer Neg Hx    PE: BP 138/88   Pulse 78   Ht 5\' 5"  (1.651 m)    Wt 220 lb 9.6 oz (100.1 kg)   LMP  (LMP Unknown)   SpO2 95%   BMI 36.71 kg/m  Body mass index is 36.71 kg/m.  Wt Readings from Last 3 Encounters:  04/29/20 220 lb 9.6 oz (100.1 kg)  02/28/20 217 lb 5 oz (98.6 kg)  02/05/20 219 lb 4.8 oz (99.5 kg)   Constitutional: overweight, in NAD, + oxygen, + walks with a walker Eyes: PERRLA, EOMI, no exophthalmos ENT: moist mucous membranes, no thyromegaly, no cervical lymphadenopathy Cardiovascular: RRR, No RG, +1/6 SEM, + R LE edema Respiratory: CTA B Gastrointestinal: abdomen soft, NT, ND, BS+ Musculoskeletal: no deformities, strength intact in all 4 Skin: moist, warm, no rashes, + right LE erythema on the lower leg-chronic after surgery on her right knee; + very dry skin Neurological: no tremor with outstretched hands, DTR normal in all 4  ASSESSMENT: 1. DM2, non-insulin-dependent,  controlled, with complications - mild CKD  2. Hypothyroidism  3. HL  4. Anemia  PLAN:  1. Patient with longstanding, previously uncontrolled type 2 diabetes, on low-dose Metformin with now good control. -At last visit, sugars were all at goal and HbA1c was excellent, at 5.3%.  We did not change her regimen. -At today's visit, per her report, sugars are very controlled, but she does have CBGs in the 180s even up to 200 after sweets or starches. For the last 2 weeks, she only has 2 values into her meter, 105 and 107. She does not report any low blood sugars. Sugars  appear controlled so I advised her to continue Metformin. Of note, her HbA1c levels are not very accurate for her since she is on iron - I advised her to: Patient Instructions  Please continue Synthroid 150 mcg daily.  Take the thyroid hormone every day, with water, at least 30 minutes before breakfast, separated by at least 4 hours from: - acid reflux medications - calcium - iron - multivitamins  Please continue: - Metformin 500 mg with supper  Targets for blood sugars: - before meals:  80-130 - after meals (2h after): <180 - HbA1c <7%  Please stop at the lab.  Please return in 6 months with your sugar log.   - we checked her HbA1c: 5.1% (lower) -likely lower than expected from her CBG log due to iron therapy - advised to check sugars at different times of the day - 1x a day, rotating check times - advised for yearly eye exams >> she is not UTD - return to clinic in 6 months  2. Hypothyroidism -History of not taking levothyroxine consistently - latest thyroid labs reviewed with pt >> TSH was suppressed at her visit with PCP: Lab Results  Component Value Date   TSH 0.29 (L) 01/25/2020   - she continues on LT4 DAW 150 mcg daily - pt feels good on this dose. - we discussed about taking the thyroid hormone every day, with water, >30 minutes before breakfast, separated by >4 hours from acid reflux medications, calcium, iron, multivitamins. Pt. is taking it correctly. - will check thyroid tests today: TSH and fT4 -she tells me she is still has other doses of Synthroid at home: 125 and 137 mcg, if needed - If labs are abnormal, she will need to return for repeat TFTs in 1.5 months  3. HL -Reviewed latest lipid panel from 06/2019: All fractions at goal Lab Results  Component Value Date   CHOL 148 06/26/2019   HDL 60.00 06/26/2019   LDLCALC 69 06/26/2019   LDLDIRECT 63.0 07/29/2015   TRIG 93.0 06/26/2019   CHOLHDL 2 06/26/2019  -Continues Crestor 10 every other day and Zetia 10 daily without side effects  4. Anemia -Per PCPs request, we will check a CBC with differential and iron level  Component     Latest Ref Rng & Units 04/29/2020  WBC     4.0 - 10.5 K/uL 5.0  RBC     3.87 - 5.11 Mil/uL 3.56 (L)  Hemoglobin     12.0 - 15.0 g/dL 11.0 (L)  HCT     36.0 - 46.0 % 32.4 (L)  MCV     78.0 - 100.0 fl 91.1  MCH     27.0 - 33.0 pg   MCHC     30.0 - 36.0 g/dL 33.8  RDW     11.5 - 15.5 % 14.9  Platelets     150.0 -  400.0 K/uL 179.0  MPV     7.5 - 12.5 fL    NEUT#     1.4 - 7.7 K/uL 3.5  Lymphocyte #     0.7 - 4.0 K/uL 1.1  Absolute Monocytes     200 - 950 cells/uL   Eosinophils Absolute     0.0 - 0.7 K/uL 0.1  Basophils Absolute     0.0 - 0.1 K/uL 0.0  Neutrophils     43.0 - 77.0 % 69.4  Total Lymphocyte     %   Monocytes Relative     3.0 - 12.0 % 6.8  Eosinophil     0.0 - 5.0 % 1.6  Basophil     0.0 - 3.0 % 0.6  Lymphocytes     12.0 - 46.0 % 21.6  Monocyte #     0.1 - 1.0 K/uL 0.3  Hemoglobin A1C     4.0 - 5.6 % 5.1  TSH     0.35 - 4.50 uIU/mL 0.80  T4,Free(Direct)     0.60 - 1.60 ng/dL 1.30  Iron     42 - 145 ug/dL 51   TFTs are normal. Hemoglobin improved.  Philemon Kingdom, MD PhD [facility]

## 2020-04-30 ENCOUNTER — Encounter: Payer: Self-pay | Admitting: Pulmonary Disease

## 2020-04-30 ENCOUNTER — Encounter: Payer: Self-pay | Admitting: Internal Medicine

## 2020-04-30 ENCOUNTER — Ambulatory Visit (INDEPENDENT_AMBULATORY_CARE_PROVIDER_SITE_OTHER): Payer: Medicare Other | Admitting: Pulmonary Disease

## 2020-04-30 ENCOUNTER — Other Ambulatory Visit: Payer: Self-pay | Admitting: Family Medicine

## 2020-04-30 VITALS — BP 128/72 | HR 85 | Temp 97.9°F | Ht 65.0 in | Wt 221.6 lb

## 2020-04-30 DIAGNOSIS — J9611 Chronic respiratory failure with hypoxia: Secondary | ICD-10-CM

## 2020-04-30 DIAGNOSIS — J449 Chronic obstructive pulmonary disease, unspecified: Secondary | ICD-10-CM

## 2020-04-30 DIAGNOSIS — D649 Anemia, unspecified: Secondary | ICD-10-CM

## 2020-04-30 MED ORDER — FLUTICASONE-SALMETEROL 100-50 MCG/DOSE IN AEPB: 1.0000 | INHALATION_SPRAY | Freq: Two times a day (BID) | RESPIRATORY_TRACT | 5 refills | Status: DC

## 2020-04-30 NOTE — Progress Notes (Unsigned)
Please update patient.  I would continue iron as is and recheck labs here in about 3 months.  I put in the orders.  Thanks.

## 2020-04-30 NOTE — Progress Notes (Signed)
Pulmonary, Critical Care, and Sleep Medicine  Chief Complaint  Patient presents with  . Follow-up    Productive cough with clear phlegm for two weeks    Constitutional:  BP 128/72 (BP Location: Left Arm, Cuff Size: Normal)   Pulse 85   Temp 97.9 F (36.6 C) (Temporal)   Ht 5\' 5"  (1.651 m)   Wt 221 lb 9.6 oz (100.5 kg)   LMP  (LMP Unknown)   SpO2 98% Comment: 3L Pulse O2  BMI 36.88 kg/m   Past Medical History:  Depression, Hypothyroidism, HTN, Diastolic CHF, HLD, DM, GERD, Peripheral neuropathy  Past Surgical History:  She  has a past surgical history that includes Tonsillectomy; Rotator cuff repair (1984); Cesarean section; Total abdominal hysterectomy (1985); Cholecystectomy (1997); THUMB RELEASE (12/1997); Carpal tunnel release (12/1997); Esophagogastroduodenoscopy (12/05/1997); ABD U/S (03/19/1999); CT ABD W & PELVIS WO/W CM; CARDIOLITE PERSANTINE (08/24/2000); CAROTID U/S (08/24/2000); DEXA (07/03/2003); ADENOSINE MYOVIEW (06/02/2007); CAROTID U/S (06/02/2007); US ECHOCARDIOGRAPHY (06/02/2007); Hernia repair (01/24/2009); knee arthroscopic surgery (years ago); Shoulder open rotator cuff repair (02/08/2012); Gallbladder surgery; Dental surgery (2016); Colonoscopy with propofol (N/A, 02/12/2016); and Esophagogastroduodenoscopy (egd) with propofol (N/A, 02/12/2016).  Brief Summary:  Alexandria [name] is a 85 y.o. female female with dyspnea from COPD, diastolic CHF, deconditioning, and iron deficiency anemia. She also has hx of OSA, but intolerant of CPAP.      Subjective:   She finished XRT.  Has f/u CT chest for March 2022.  Has occasional cough with clear sputum.  Not having wheeze, chest pain, fever, or hemoptysis.  Using 2 to 3 liters oxygen.  No issues with sleep.  Physical Exam:   Appearance - well kempt, wearing oxygen  ENMT - no sinus tenderness, no oral exudate, no LAN, Mallampati 3 airway, no stridor  Respiratory - decreased breath sounds  bilaterally, scattered rhonchi clear with coughing  CV - s1s2 regular rate and rhythm, no murmurs  Ext - no clubbing, no edema  Skin - scaling of Rt > Lt lower legs  Psych - normal mood and affect   Pulmonary testing:   RAST 10/02/10 >> negative, IgE 8.1  A1AT 04/15/11 >> MM  PFT 10/512 >> FEV1 1.29 (61%), FEV1% 57, TLC 6.01 (119%), RV 3.52 (153%), DLCO 67%, no BD  PFT 08/15/14 >> FEV1 1.07 (57%), FEV1% 55, TLC 6.49 (128%), DLCO 47%, no BD  Chest Imaging:   CT chest 06/29/16 >> atherosclerosis, RUL nodule 7 mm (was 8 mm), no change 6 mm LUL nodule, no change 4 mm RLL nodule   CT chest 12/23/17 >> 10 mm RUL nodule, 7 mm RUL nodule, 4 mm RLL nodule, 6 mm LUL nodule, centrilobular emphysema  PET scan 01/06/18 >> RUL nodule not hypermetabolic  CT chest 2/59/56 >> atherosclerosis, moderate centrilobular emphysema with diffuse bronchial wall thickening, 2.4 cm posterior apical irregular nodule increased from 2.3 cm, scar medial RLL, scattered nodules up to 4 mm in medial RLL, 6 mm nodule in LLL  CT chest 12/14/18 >> no change RUL nodule  CT chest 06/13/19 >> 2.2 x 1.2 cm RUL nodule (was 2.1 x 1.0 cm from October 2020), 0.4 cm nodule LLL  Sleep Tests:   PSG 01/27/10 >> AHI 13, SpO2 low 73%  ONO with CPAP 08/09/14 >>Test time 9 hr 16 min. Mean SpO2 92.2%, low SpO2 86%. Spent 10 min with SpO2 <88%  ONO with CPAP 06/24/16 >>test time 7 hrs 29 min. Average SpO2 90%, low SpO2 79%. Spent 58 min with SpO2 <88%.  ONO with RA 11/01/16 >>test time 6 hrs 43 min. Average SpO2 89%, low SpO2 67%. Spent 2 hrs 7 min with SpO2 <88%.  ONO with 2 liters 07/18/18 >>test time 8 hrs 34 min. Baseline SpO2 94%, low SpO2 69%. Spent 38 min with SpO2 <88%.  Cardiac Tests:   Echo 09/28/14 >> mild LVH, EF 55 to 60%  Social History:  She  reports that she quit smoking about 15 years ago. Her smoking use included cigarettes. She has a 100.00 pack-year smoking history. She has never used smokeless  tobacco. She reports current alcohol use. She reports that she does not use drugs.  Family History:  Her family history includes Cystic fibrosis in her sister; Emphysema in her father; Heart disease in her mother; Hyperthyroidism in her brother and sister; Osteoarthritis in her brother; Thyroid disease in her mother.     Assessment/Plan:   COPD with emphysema and chronic bronchitis. - symbicort no longer covered by insurance - will change to fluticasone/salmeterol  - prn albuterol - she will call if she wants to get a home nebulizer set up  Chronic respiratory failure with hypoxia. - goal SpO2 > 90% - 2 to 3 liters pulsed oxygen 24/7  Rt upper lung nodule. - presumptive diagnosis of lung cancer - completed empiric SBRT - follow scan in March 2022 and then radiation oncology after  Allergic rhinitis. - prn allegra, fluticasone   Time Spent Involved in Patient Care on Day of Examination:  22 minutes  Follow up:  Patient Instructions  Follow up in 6 months   Medication List:   Allergies as of 04/30/2020      Reactions   Antihistamines, Diphenhydramine-type Other (See Comments)   Able to tolerate only allegra.    Sulfonamide Derivatives Swelling   REACTION: closed throat   Incruse Ellipta [umeclidinium Bromide] Cough   Caused voice change and severe coughing   Clarithromycin Other (See Comments)   REACTION: diff swallowing and mouth blisters   Codeine Other (See Comments)   Unknown    Lyrica [pregabalin] Other (See Comments)   Lack of effect for neuropathy pain.     Spiriva Handihaler [tiotropium Bromide Monohydrate] Other (See Comments)   Voice changes      Medication List       Accurate as of April 30, 2020 12:03 PM. If you have any questions, ask your nurse or doctor.        STOP taking these medications   cephALEXin 250 MG capsule Commonly known as: Keflex Stopped by: Chesley Mires, MD   Symbicort 160-4.5 MCG/ACT inhaler Generic drug:  budesonide-formoterol Stopped by: Chesley Mires, MD     TAKE these medications   albuterol 108 (90 Base) MCG/ACT inhaler Commonly known as: ProAir HFA Inhale 2 puffs into the lungs every 6 (six) hours as needed for wheezing or shortness of breath. INHALE 2 PUFFS INTO THE LUNGS EVERY 6 HOURS AS NEEDED FOR WHEEZING OR SHORTNESS OF BREATH   B-12 5000 MCG Caps Take 5,000 mcg by mouth daily.   busPIRone 5 MG tablet Commonly known as: BUSPAR 15 mg 3 (three) times daily. As needed   cariprazine capsule Commonly known as: VRAYLAR Take 1.5 mg by mouth daily.   Coenzyme Q-10 200 MG Caps Take 200 mg by mouth daily.   EPINEPHrine 0.3 mg/0.3 mL Devi Commonly known as: EPI-PEN Inject 0.3 mg into the muscle once as needed (anaphylaxis).   esomeprazole 40 MG capsule Commonly known as: NEXIUM Take 40 mg by mouth every  other day.   ezetimibe 10 MG tablet Commonly known as: ZETIA TAKE 1 TABLET BY MOUTH EVERYDAY AT BEDTIME   fexofenadine 180 MG tablet Commonly known as: ALLEGRA Take 180 mg by mouth as needed.   FLUoxetine 10 MG tablet Commonly known as: PROZAC Take 20 mg by mouth in the morning and at bedtime.   fluticasone 50 MCG/ACT nasal spray Commonly known as: FLONASE USE 2 SPRAYS INTO THE NOSE EVERY 12 HOURS AS NEEDED FOR STUFFY NOSE   Fluticasone-Salmeterol 100-50 MCG/DOSE Aepb Commonly known as: ADVAIR Inhale 1 puff into the lungs 2 (two) times daily. Started by: Chesley Mires, MD   furosemide 20 MG tablet Commonly known as: LASIX TAKE 1 TABLET (20 MG TOTAL) BY MOUTH DAILY AS NEEDED FOR FLUID.   glucose blood test strip Commonly known as: OneTouch Verio Use as instructed to check sugar 2-3x time daily   OneTouch Verio test strip Generic drug: glucose blood USE AS INSTRUCTED TO CHECK SUGAR 1 TIME DAILY   GLUTATHIONE PO Take 5 drops by mouth in the morning and at bedtime.   Iron (Ferrous Sulfate) 325 (65 Fe) MG Tabs Take 325 mg by mouth daily.   metFORMIN 500 MG  tablet Commonly known as: GLUCOPHAGE Take 1 tablet (500 mg total) by mouth daily with supper.   nitroGLYCERIN 0.4 MG SL tablet Commonly known as: NITROSTAT Place 1 tablet (0.4 mg total) under the tongue every 5 (five) minutes as needed for chest pain.   OneTouch Delica Lancets Fine Misc USE TO CHECK SUGAR 1 TIME DAILY   potassium chloride 10 MEQ tablet Commonly known as: KLOR-CON Take 1 tablet (10 mEq total) by mouth daily as needed (Takes with furosemide doses.).   rosuvastatin 10 MG tablet Commonly known as: CRESTOR TAKE 1 TABLET (10 MG TOTAL) BY MOUTH EVERY OTHER DAY.   Spacer/Aero Chamber [employer] Use with  inhaler as needed.  J44.9   Synthroid 150 MCG tablet Generic drug: levothyroxine Take 1 tablet (150 mcg total) by mouth daily before breakfast.   valsartan 160 MG tablet Commonly known as: DIOVAN TAKE ONE HALF TABLET (80 MG TOTAL) 2 TIMES DAILY.   VITAMIN A PO Take 600 mcg by mouth.   VITAMIN D (CHOLECALCIFEROL) PO Take 15 mg by mouth.   Vitamin D 50 MCG (2000 UT) tablet Take 1 tablet (2,000 Units total) by mouth daily.   ZINC PO Take by mouth.       Signature:  Chesley Mires, MD [place] Pager - [phone] 04/30/2020, 12:03 PM

## 2020-04-30 NOTE — Patient Instructions (Signed)
Follow up in 6 months 

## 2020-05-01 NOTE — Progress Notes (Signed)
Patient advised to continue taking Iron as is and lab appt scheduled for 07/29/20 at 2:00 pm.

## 2020-05-13 DIAGNOSIS — F4323 Adjustment disorder with mixed anxiety and depressed mood: Secondary | ICD-10-CM | POA: Diagnosis not present

## 2020-05-25 NOTE — Progress Notes (Signed)
Cardiology Office Note  Date:  05/27/2020   ID:  SHALINI MAIR, DOB September 11, 1935, MRN [id number]  PCP:  Tonia Ghent, MD   Chief Complaint  Patient presents with  . Follow-up    6 Months follow up. Medications verbally reviewed with patient.     HPI:  85 year old woman with  obesity,   long history of smoking for 50 years, COPD, quit 15 years DM2, dx in 2006, non-insulin-dependent, controlled, with  mild CKD hyperlipidemia,  hypertension,   2012  chest pain, stress test at that time with no ischemia, normal EF  July 2016 with COPD exacerbation, sepsis. sleep apnea, not on CPAP, seen by Dr. Llana Aliment, mask does not fit Chronic SOB, normal echo 09/2014 Diagnosis of lung cancer,  completed radiation , planned chemo She presents for follow-up of her shortness of breath symptoms  LOV 11/2019 Anxiety issues, Followed by psychiatry, Worse recently, dates back for months to years Started on buspar, Everything causing anxiety  Bathroom/bedroom "too small for her" Feeling  Sleeping in recliner in living room Knees and feet hurt "all the time"  Trace leg swelling r>L  Weight down 8 pounds from 11/2019  Some GI issues,  Slow motility, Certain foods upset stomach  Labs reviewed Total chol 148 LDL 69  EKG personally reviewed by myself on todays visit Shows normal sinus rhythm rate 74 bpm nonspecific St and T wave changes  Other PMH reviewed CT scan chest 06/2019: Subsolid 2.2 cm posterior apical right upper lobe pulmonary nodule with 1.0 cm solid component, mildly increased since 12/14/2018 CT, slowly increasing on multiple CT studies back to 2017. Findings are most compatible with a slow growing primary bronchogenic carcinoma.  PET scan Increased metabolic activity in the area of the part solid nodule in the RIGHT upper lobe. Findings are suspicious for indolent bronchogenic neoplasm.  Did XRT Declined chemo  Previous Escherichia coli sepsis,  At that time had  anemia with hematocrit down to 25, iron deficiency anemia Treated for COPD exacerbation CT scan of the abdomen showed mild descending aorta atherosclerosis   taking Crestor and zetia.   PMH:   has a past medical history of Allergy, unspecified not elsewhere classified, Anxiety, Cataract, Chronic diastolic congestive heart failure ([place]), COPD (chronic obstructive pulmonary disease) ([place]) (03/08/1998), Depression (12/07/1998), Diabetes mellitus type II (02/05/2006), Disorders of bursae and tendons in shoulder region, unspecified, E. coli bacteremia, Esophagitis, GERD (gastroesophageal reflux disease), History of UTI, Hyperlipidemia (10/04/2000), Hypertension (03/08/1992), Hypothyroidism (03/08/1968), Iron deficiency anemia, Lung nodule, Malignant neoplasm of right upper lobe of lung ([place]) (07/24/2019), Obesity, OSA (obstructive sleep apnea), Peripheral neuropathy, and Ventral hernia.  PSH:    Past Surgical History:  Procedure Laterality Date  . ABD U/S  03/19/1999   Nml x2 foci in liver  . ADENOSINE MYOVIEW  06/02/2007   Nml  . CARDIOLITE PERSANTINE  08/24/2000   Nml  . CAROTID U/S  08/24/2000   1-39% ICA stenosis  . CAROTID U/S  06/02/2007   No apprec change   . CARPAL TUNNEL RELEASE  12/1997   Right  . CESAREAN SECTION     x2 Breech/ repeat  . CHOLECYSTECTOMY  1997  . COLONOSCOPY WITH PROPOFOL N/A 02/12/2016   Procedure: COLONOSCOPY WITH PROPOFOL;  Surgeon: Milus Banister, MD;  Location: [facility];  Service: Endoscopy;  Laterality: N/A;  . CT ABD W & PELVIS WO/W CM     Abd hemangiomas of liver, 1 cm R renal cyst  . DENTAL SURGERY  2016  Implants  . DEXA  07/03/2003   Nml  . ESOPHAGOGASTRODUODENOSCOPY  12/05/1997   Nml (due to hoarseness)  . ESOPHAGOGASTRODUODENOSCOPY (EGD) WITH PROPOFOL N/A 02/12/2016   Procedure: ESOPHAGOGASTRODUODENOSCOPY (EGD) WITH PROPOFOL;  Surgeon: Milus Banister, MD;  Location: [facility];  Service: Endoscopy;  Laterality: N/A;  . GALLBLADDER SURGERY     . HERNIA REPAIR  01/24/2009   Lap Ventr w/ Lysis of adhesions (Dr. Donne Hazel)  . knee arthroscopic surgery  years ago   right  . ROTATOR CUFF REPAIR  1984   Right, [place]  . SHOULDER OPEN ROTATOR CUFF REPAIR  02/08/2012   Procedure: ROTATOR CUFF REPAIR SHOULDER OPEN;  Surgeon: Magnus Sinning, MD;  Location: [facility];  Service: Orthopedics;  Laterality: Left;  Left Shoulder Open Anterior Acrominectomy Rotator Cuff Repair Open Distal Clavicle Resection ,tissue mend graft, and repair of biceps tendon  . THUMB RELEASE  12/1997   Right  . TONSILLECTOMY    . TOTAL ABDOMINAL HYSTERECTOMY  1985   Due to dysmennorhea  . US ECHOCARDIOGRAPHY  06/02/2007    Current Outpatient Medications  Medication Sig Dispense Refill  . albuterol (PROAIR HFA) 108 (90 Base) MCG/ACT inhaler Inhale 2 puffs into the lungs every 6 (six) hours as needed for wheezing or shortness of breath. INHALE 2 PUFFS INTO THE LUNGS EVERY 6 HOURS AS NEEDED FOR WHEEZING OR SHORTNESS OF BREATH 3 Inhaler 3  . busPIRone (BUSPAR) 15 MG tablet Take 15 mg by mouth 3 (three) times daily.    . cariprazine (VRAYLAR) capsule Take 1.5 mg by mouth daily.    . Cholecalciferol (VITAMIN D) 50 MCG (2000 UT) tablet Take 1 tablet (2,000 Units total) by mouth daily.    . Coenzyme Q-10 200 MG CAPS Take 200 mg by mouth daily.    . Cyanocobalamin (B-12) 5000 MCG CAPS Take 5,000 mcg by mouth daily.    Marland Kitchen EPINEPHrine (EPI-PEN) 0.3 mg/0.3 mL DEVI Inject 0.3 mg into the muscle once as needed (anaphylaxis).    Marland Kitchen esomeprazole (NEXIUM) 40 MG capsule Take 40 mg by mouth every other day.     . ezetimibe (ZETIA) 10 MG tablet TAKE 1 TABLET BY MOUTH EVERYDAY AT BEDTIME 90 tablet 1  . fexofenadine (ALLEGRA) 180 MG tablet Take 180 mg by mouth as needed.     Marland Kitchen FLUoxetine (PROZAC) 10 MG tablet Take 20 mg by mouth in the morning and at bedtime.    . fluticasone (FLONASE) 50 MCG/ACT nasal spray USE 2 SPRAYS INTO THE NOSE EVERY 12 HOURS AS NEEDED FOR STUFFY NOSE 48 mL  2  . Fluticasone-Salmeterol (ADVAIR) 100-50 MCG/DOSE AEPB Inhale 1 puff into the lungs 2 (two) times daily. 60 each 5  . furosemide (LASIX) 20 MG tablet TAKE 1 TABLET (20 MG TOTAL) BY MOUTH DAILY AS NEEDED FOR FLUID. 90 tablet 1  . glucose blood (ONETOUCH VERIO) test strip Use as instructed to check sugar 2-3x time daily 200 each 5  . GLUTATHIONE PO Take 5 drops by mouth in the morning and at bedtime.    . Iron, Ferrous Sulfate, 325 (65 Fe) MG TABS Take 325 mg by mouth daily. 90 tablet 3  . metFORMIN (GLUCOPHAGE) 500 MG tablet Take 1 tablet (500 mg total) by mouth daily with supper. 90 tablet 3  . Multiple Vitamins-Minerals (ZINC PO) Take by mouth.    . nitroGLYCERIN (NITROSTAT) 0.4 MG SL tablet Place 1 tablet (0.4 mg total) under the tongue every 5 (five) minutes as needed for chest pain. 25  tablet 3  . ONETOUCH DELICA LANCETS FINE MISC USE TO CHECK SUGAR 1 TIME DAILY 100 each 5  . ONETOUCH VERIO test strip USE AS INSTRUCTED TO CHECK SUGAR 1 TIME DAILY 100 strip 8  . rosuvastatin (CRESTOR) 10 MG tablet TAKE 1 TABLET (10 MG TOTAL) BY MOUTH EVERY OTHER DAY. 45 tablet 2  . Spacer/Aero Chamber [employer] Use with  inhaler as needed.  J44.9 1 each 0  . SYNTHROID 150 MCG tablet Take 1 tablet (150 mcg total) by mouth daily before breakfast. 45 tablet 5  . valsartan (DIOVAN) 160 MG tablet TAKE ONE HALF TABLET (80 MG TOTAL) 2 TIMES DAILY. 90 tablet 1  . VITAMIN A PO Take 600 mcg by mouth.    Marland Kitchen VITAMIN D, CHOLECALCIFEROL, PO Take 15 mg by mouth.    . potassium chloride (K-DUR) 10 MEQ tablet Take 1 tablet (10 mEq total) by mouth daily as needed (Takes with furosemide doses.). 90 tablet 1   No current facility-administered medications for this visit.     Allergies:   Antihistamines, diphenhydramine-type; Sulfonamide derivatives; Incruse ellipta [umeclidinium bromide]; Clarithromycin; Codeine; Lyrica [pregabalin]; and Spiriva handihaler [tiotropium bromide monohydrate]   Social History:  The  patient  reports that she quit smoking about 15 years ago. Her smoking use included cigarettes. She has a 100.00 pack-year smoking history. She has never used smokeless tobacco. She reports current alcohol use. She reports that she does not use drugs.   Family History:   family history includes Cystic fibrosis in her sister; Emphysema in her father; Heart disease in her mother; Hyperthyroidism in her brother and sister; Osteoarthritis in her brother; Thyroid disease in her mother.    Review of Systems: Review of Systems  Constitutional: Negative.   HENT: Negative.   Respiratory: Positive for shortness of breath.   Cardiovascular: Negative.   Gastrointestinal: Negative.   Musculoskeletal: Negative.        Leg weakness  Neurological: Negative.   Psychiatric/Behavioral: Negative.   All other systems reviewed and are negative.   PHYSICAL EXAM: VS:  BP (!) 144/72 (BP Location: Left Arm, Patient Position: Sitting, Cuff Size: Normal)   Pulse 74   Ht 5\' 5"  (1.651 m)   Wt 218 lb (98.9 kg)   LMP  (LMP Unknown)   SpO2 93% Comment: 2 Liters of Oxygen  BMI 36.28 kg/m  , BMI Body mass index is 36.28 kg/m. Constitutional:  oriented to person, place, and time. No distress.  HENT:  Head: Grossly normal Eyes:  no discharge. No scleral icterus.  Neck: No JVD, no carotid bruits  Cardiovascular: Regular rate and rhythm, no murmurs appreciated Pulmonary/Chest: Clear to auscultation bilaterally, no wheezes or rails Abdominal: Soft.  no distension.  no tenderness.  Musculoskeletal: Normal range of motion Neurological:  normal muscle tone. Coordination normal. No atrophy Skin: Skin warm and dry Psychiatric: Anxious   Recent Labs: 01/25/2020: ALT 9; BUN 22; Creat 0.86; Potassium 4.7; Sodium 140 04/29/2020: Hemoglobin 11.0; Platelets 179.0; TSH 0.80    Lipid Panel Lab Results  Component Value Date   CHOL 148 06/26/2019   HDL 60.00 06/26/2019   LDLCALC 69 06/26/2019   TRIG 93.0 06/26/2019       Wt Readings from Last 3 Encounters:  05/27/20 218 lb (98.9 kg)  04/30/20 221 lb 9.6 oz (100.5 kg)  04/29/20 220 lb 9.6 oz (100.1 kg)       ASSESSMENT AND PLAN:   Chronic diastolic CHF (congestive heart failure) (HCC)  Sleeping with legs  down a little bit, takes Lasix as needed not on a regular basis Recommend she closely monitor leg edema, abdominal distention and need for more Lasix She prefers to take it as needed  Essential hypertension - Plan: EKG 12-Lead Blood pressure is well controlled on today's visit. No changes made to the medications.  Anxiety Major issue she reports Everything causing anxiety to her, working with psychiatry, slow increase in BuSpar, she reports it is not helping very much Not her usual self today, spacing out, staring off at the door  Lower extremity swelling  Recommend leg elevation, compression hose, more liberal use of her Lasix  COPD exacerbation (HCC) On chronic oxygen , inhalers/Symbicort Very debilitated, followed by pulmonary  Denies any recent exacerbations of her COPD Appears at her baseline  Shortness of breath Multifactorial with underlying lung disease/COPD, morbid obesity, deconditioning, chronic diastolic CHF On 2 L [place] , stable Rarely up to 3-4 sparingly Minimally active  Type 2 diabetes mellitus with diabetic neuropathy, without long-term current use of insulin (HCC) Weight down slightly from last year Reports certain foods are upsetting her  stomach  Morbid obesity ([place]) Weight down 8 pounds   Total encounter time more than 25 minutes  Greater than 50% was spent in counseling and coordination of care with the patient   Orders Placed This Encounter  Procedures  . EKG 12-Lead     Signed, Esmond Plants, M.D., Ph.D. 05/27/2020  [place], [place]

## 2020-05-27 ENCOUNTER — Encounter: Payer: Self-pay | Admitting: Cardiovascular Disease

## 2020-05-27 ENCOUNTER — Other Ambulatory Visit: Payer: Self-pay

## 2020-05-27 ENCOUNTER — Ambulatory Visit (INDEPENDENT_AMBULATORY_CARE_PROVIDER_SITE_OTHER): Payer: Medicare Other | Admitting: Cardiovascular Disease

## 2020-05-27 VITALS — BP 144/72 | HR 74 | Ht 65.0 in | Wt 218.0 lb

## 2020-05-27 DIAGNOSIS — I10 Essential (primary) hypertension: Secondary | ICD-10-CM

## 2020-05-27 DIAGNOSIS — I5032 Chronic diastolic (congestive) heart failure: Secondary | ICD-10-CM

## 2020-05-27 NOTE — Patient Instructions (Signed)
Medication Instructions:  No changes  If you need a refill on your cardiac medications before your next appointment, please call your pharmacy.    Lab work: No new labs needed   If you have labs (blood work) drawn today and your tests are completely normal, you will receive your results only by: . MyChart Message (if you have MyChart) OR . A paper copy in the mail If you have any lab test that is abnormal or we need to change your treatment, we will call you to review the results.   Testing/Procedures: No new testing needed   Follow-Up: At [facility], you and your health needs are our priority.  As part of our continuing mission to provide you with exceptional heart care, we have created designated Provider Care Teams.  These Care Teams include your primary Cardiologist (physician) and Advanced Practice Providers (APPs -  Physician Assistants and Nurse Practitioners) who all work together to provide you with the care you need, when you need it.  . You will need a follow up appointment in 6 months  . Providers on your designated Care Team:   . Christopher Berge, NP . Ryan Dunn, PA-C . Jacquelyn Visser, PA-C  Any Other Special Instructions Will Be Listed Below (If Applicable).  COVID-19 Vaccine Information can be found at: [email or web address] For questions related to vaccine distribution or appointments, please email [email or web address] or call [phone].     

## 2020-06-02 ENCOUNTER — Ambulatory Visit: Payer: Medicare Other

## 2020-06-02 ENCOUNTER — Telehealth: Payer: Self-pay | Admitting: *Deleted

## 2020-06-02 NOTE — Telephone Encounter (Signed)
CALLED PATIENT TO INFORM OF STAT LABS ON 06-04-20 @ 2:45 PM @ [name] , AND CT TO FOLLOW ON 06-04-20 - ARRIVAL TIME - 3:45 PM @ [facility], PATIENT TO HAVE WATER ONLY - 4 HRS. PRIOR TO TEST, PATIENT TO FU WITH A. PERKINS ON 06-16-20 VIA TELEPHONE FOR RESULTS, PATIENT VERIFIED UNDERSTANDING ALL THESE APPTS.

## 2020-06-04 ENCOUNTER — Ambulatory Visit (HOSPITAL_COMMUNITY): Payer: Medicare Other

## 2020-06-04 ENCOUNTER — Ambulatory Visit: Payer: Medicare Other

## 2020-06-09 ENCOUNTER — Inpatient Hospital Stay: Admission: RE | Admit: 2020-06-09 | Payer: Self-pay | Source: Ambulatory Visit | Admitting: Radiation Oncology

## 2020-06-16 ENCOUNTER — Ambulatory Visit: Payer: Medicare Other | Admitting: Radiation Oncology

## 2020-06-17 DIAGNOSIS — H353131 Nonexudative age-related macular degeneration, bilateral, early dry stage: Secondary | ICD-10-CM | POA: Diagnosis not present

## 2020-06-17 DIAGNOSIS — H40013 Open angle with borderline findings, low risk, bilateral: Secondary | ICD-10-CM | POA: Diagnosis not present

## 2020-06-17 DIAGNOSIS — H2513 Age-related nuclear cataract, bilateral: Secondary | ICD-10-CM | POA: Diagnosis not present

## 2020-06-17 DIAGNOSIS — F4323 Adjustment disorder with mixed anxiety and depressed mood: Secondary | ICD-10-CM | POA: Diagnosis not present

## 2020-06-17 DIAGNOSIS — H52203 Unspecified astigmatism, bilateral: Secondary | ICD-10-CM | POA: Diagnosis not present

## 2020-06-17 LAB — HM DIABETES EYE EXAM

## 2020-06-20 ENCOUNTER — Encounter: Payer: Self-pay | Admitting: Radiation Oncology

## 2020-06-24 ENCOUNTER — Other Ambulatory Visit: Payer: Self-pay | Admitting: Radiation Oncology

## 2020-06-25 ENCOUNTER — Telehealth: Payer: Self-pay | Admitting: *Deleted

## 2020-06-25 DIAGNOSIS — M8589 Other specified disorders of bone density and structure, multiple sites: Secondary | ICD-10-CM | POA: Diagnosis not present

## 2020-06-25 LAB — HM DEXA SCAN

## 2020-06-25 NOTE — Telephone Encounter (Signed)
Called patient's daughter- Shital Crayton to inform of I-Stat labs on 07-03-20 to be drawn in Radiology same day as Ct, spoke with Gaynelle Arabian and she  Is aware of this

## 2020-06-30 ENCOUNTER — Other Ambulatory Visit: Payer: Self-pay

## 2020-06-30 ENCOUNTER — Telehealth: Payer: Self-pay

## 2020-06-30 NOTE — Telephone Encounter (Signed)
Spoke with patient in regards to telephone visit appointment with Shona Simpson PA on 07/07/20 @ 2:30pm. Patient is aware of appointment. Called to review meaningful use questions. Patient would like me to call daughter in regards to completing meaningful use questions.

## 2020-07-02 ENCOUNTER — Other Ambulatory Visit: Payer: Self-pay

## 2020-07-02 ENCOUNTER — Encounter: Payer: Self-pay | Admitting: Radiation Oncology

## 2020-07-02 ENCOUNTER — Telehealth: Payer: Self-pay

## 2020-07-02 NOTE — Telephone Encounter (Signed)
Spoke with patient and her daughter in regards to telephone visit with Shona Simpson PA on 07/07/20. Patient would like daughter to be called first then patch her in on the call. Daughter is available at 2:00pm on 07/07/20. Advised daughter that should not be an issue to be called at 2:00pm instead of 2:30pm. Spoke with patient and reviewed meaningful use questions. TM

## 2020-07-03 ENCOUNTER — Other Ambulatory Visit: Payer: Self-pay

## 2020-07-03 ENCOUNTER — Ambulatory Visit (HOSPITAL_COMMUNITY)
Admission: RE | Admit: 2020-07-03 | Discharge: 2020-07-03 | Disposition: A | Discharge status: Home / Self Care | Payer: Medicare Other | Source: Ambulatory Visit | Attending: Radiation Oncology | Admitting: Radiation Oncology

## 2020-07-03 ENCOUNTER — Ambulatory Visit: Payer: Medicare Other

## 2020-07-03 DIAGNOSIS — C3411 Malignant neoplasm of upper lobe, right bronchus or lung: Secondary | ICD-10-CM | POA: Diagnosis not present

## 2020-07-03 DIAGNOSIS — I2510 Atherosclerotic heart disease of native coronary artery without angina pectoris: Secondary | ICD-10-CM | POA: Diagnosis not present

## 2020-07-03 DIAGNOSIS — J439 Emphysema, unspecified: Secondary | ICD-10-CM | POA: Diagnosis not present

## 2020-07-03 DIAGNOSIS — C3490 Malignant neoplasm of unspecified part of unspecified bronchus or lung: Secondary | ICD-10-CM | POA: Diagnosis not present

## 2020-07-03 DIAGNOSIS — D1809 Hemangioma of other sites: Secondary | ICD-10-CM | POA: Diagnosis not present

## 2020-07-03 LAB — POCT I-STAT CREATININE: Creatinine, Ser: 0.9 mg/dL (ref 0.44–1.00)

## 2020-07-03 MED ORDER — IOHEXOL 300 MG/ML  SOLN
75.0000 mL | Freq: Once | INTRAMUSCULAR | Status: AC | PRN
  Administered 2020-07-03: 75 mL via INTRAVENOUS

## 2020-07-07 ENCOUNTER — Ambulatory Visit
Admission: RE | Admit: 2020-07-07 | Discharge: 2020-07-07 | Disposition: A | Discharge status: Home / Self Care | Payer: Medicare Other | Source: Ambulatory Visit | Attending: Radiation Oncology | Admitting: Radiation Oncology

## 2020-07-07 ENCOUNTER — Other Ambulatory Visit: Payer: Self-pay

## 2020-07-07 DIAGNOSIS — C3411 Malignant neoplasm of upper lobe, right bronchus or lung: Secondary | ICD-10-CM

## 2020-07-07 DIAGNOSIS — Z08 Encounter for follow-up examination after completed treatment for malignant neoplasm: Secondary | ICD-10-CM | POA: Diagnosis not present

## 2020-07-07 NOTE — Progress Notes (Signed)
Radiation Oncology         [phone] ________________________________   Name: Alexandria [name]        MRN: [id number]  Date of Service: 07/07/2020 DOB: 01-18-1936  HC:WCBJSE, Elveria Rising, MD  Garner Nash, DO     REFERRING PHYSICIAN: Garner Nash, DO   DIAGNOSIS: The encounter diagnosis was Malignant neoplasm of right upper lobe of lung ([place]).   HISTORY OF PRESENT ILLNESS: Alexandria [name] is a 85 y.o. female with a history of putative Stage IA3, cT1cN0M0, NSCLC of the RUL. The patient has a history of COPD and has been oxygen dependant at 2-3 L [place] for about 4 years. She has been followed as well with nodules in both lungs since about 2017. CT imaging of the chest on 06/13/19 revealed an increase in size in a dominant lesion in the apical RUL. This measured 2.2 x 1.2 cm, she had other stable nodules. PET imaging on 07/18/19 did reveal increased metabolic activity in the RUL lesion with an SUV of 2.2 despite blood pool of 3.32. She was counseled on the option of bronchoscopy for diagnosis of the presumed early stage lung cancer but was not interested in having any invasive procedures, or surgery. As a result, she proceeded with stereotactic body radiotherapy (SBRT) which she completed in August 2021. Her post treatment imaging has been stable. She had a CT for restaging of the chest on 07/03/20 that showed post treatment changes in the RUL consistent with her prior radiotherapy. No new or progressive nodules were seen. She did have stigmata of emphysematous and atherosclerotic disease. She did also have stable findings in the left 7th rib that was described as sclerotic. She's contacted today by phone to discuss these results.   PREVIOUS RADIATION THERAPY:    10/11/19 - 10/18/19 SBRT: The patient was treated to the right lung with a course of stereotactic body radiation treatment.  The patient received 54 Gray in 3 fractions using a IMRT/SBRT technique, with 3 fields.  PAST MEDICAL HISTORY:   Past Medical History:  Diagnosis Date  . Allergy, unspecified not elsewhere classified   . Anxiety   . Cataract   . Chronic diastolic congestive heart failure ([place])   . COPD (chronic obstructive pulmonary disease) ([place]) 03/08/1998   PFTs 12/12/2002 FEV 1 1.42 (64%) ratio 58 with no better after B2 and DLCO75%; PFTs 11/20/09 FEV1 1.50 (73%) ratio 50 no better after B2 with DLCO 62%; Hfa 50% 11/20/2009 >75%, 01/13/10 p coaching  . Depression 12/07/1998  . Diabetes mellitus type II 02/05/2006   Dr. Cruzita Lederer with endo  . Disorders of bursae and tendons in shoulder region, unspecified    Rotator cuff syndrome, right  . E. coli bacteremia   . Esophagitis   . GERD (gastroesophageal reflux disease)   . History of UTI   . Hyperlipidemia 10/04/2000  . Hypertension 03/08/1992  . Hypothyroidism 03/08/1968  . Iron deficiency anemia   . Lung nodule    radiation starts 10-08-2019  . Malignant neoplasm of right upper lobe of lung ([place]) 07/24/2019  . Obesity    NOS  . OSA (obstructive sleep apnea)    PSG 01/27/10 AHI 13, pt does not know CPAP settings  . Peripheral neuropathy    Likely due to DM per Dr. Erling Cruz  . Ventral hernia        PAST SURGICAL HISTORY: Past Surgical History:  Procedure Laterality Date  . ABD U/S  03/19/1999   Nml x2 foci in  liver  . ADENOSINE MYOVIEW  06/02/2007   Nml  . CARDIOLITE PERSANTINE  08/24/2000   Nml  . CAROTID U/S  08/24/2000   1-39% ICA stenosis  . CAROTID U/S  06/02/2007   No apprec change   . CARPAL TUNNEL RELEASE  12/1997   Right  . CESAREAN SECTION     x2 Breech/ repeat  . CHOLECYSTECTOMY  1997  . COLONOSCOPY WITH PROPOFOL N/A 02/12/2016   Procedure: COLONOSCOPY WITH PROPOFOL;  Surgeon: Milus Banister, MD;  Location: [facility];  Service: Endoscopy;  Laterality: N/A;  . CT ABD W & PELVIS WO/W CM     Abd hemangiomas of liver, 1 cm R renal cyst  . DENTAL SURGERY  2016   Implants  . DEXA  07/03/2003   Nml  . ESOPHAGOGASTRODUODENOSCOPY   12/05/1997   Nml (due to hoarseness)  . ESOPHAGOGASTRODUODENOSCOPY (EGD) WITH PROPOFOL N/A 02/12/2016   Procedure: ESOPHAGOGASTRODUODENOSCOPY (EGD) WITH PROPOFOL;  Surgeon: Milus Banister, MD;  Location: [facility];  Service: Endoscopy;  Laterality: N/A;  . GALLBLADDER SURGERY    . HERNIA REPAIR  01/24/2009   Lap Ventr w/ Lysis of adhesions (Dr. Donne Hazel)  . knee arthroscopic surgery  years ago   right  . ROTATOR CUFF REPAIR  1984   Right, [place]  . SHOULDER OPEN ROTATOR CUFF REPAIR  02/08/2012   Procedure: ROTATOR CUFF REPAIR SHOULDER OPEN;  Surgeon: Magnus Sinning, MD;  Location: [facility];  Service: Orthopedics;  Laterality: Left;  Left Shoulder Open Anterior Acrominectomy Rotator Cuff Repair Open Distal Clavicle Resection ,tissue mend graft, and repair of biceps tendon  . THUMB RELEASE  12/1997   Right  . TONSILLECTOMY    . TOTAL ABDOMINAL HYSTERECTOMY  1985   Due to dysmennorhea  . US ECHOCARDIOGRAPHY  06/02/2007     FAMILY HISTORY:  Family History  Problem Relation Age of Onset  . Heart disease Mother   . Thyroid disease Mother   . Emphysema Father        One lung  . Cystic fibrosis Sister   . Hyperthyroidism Sister   . Osteoarthritis Brother   . Hyperthyroidism Brother   . Esophageal cancer Neg Hx   . Cancer Neg Hx        Head or neck  . Colon cancer Neg Hx   . Stomach cancer Neg Hx   . Breast cancer Neg Hx      SOCIAL HISTORY:  reports that she quit smoking about 15 years ago. Her smoking use included cigarettes. She has a 100.00 pack-year smoking history. She has never used smokeless tobacco. She reports current alcohol use. She reports that she does not use drugs. The patient is married and lives in [place]. She is often helped by her daughter Lavella Lemons who lives in [place].   ALLERGIES: Antihistamines, diphenhydramine-type; Sulfonamide derivatives; Incruse ellipta [umeclidinium bromide]; Clarithromycin; Codeine; Lyrica [pregabalin]; and Spiriva  handihaler [tiotropium bromide monohydrate]   MEDICATIONS:  Current Outpatient Medications  Medication Sig Dispense Refill  . albuterol (PROAIR HFA) 108 (90 Base) MCG/ACT inhaler Inhale 2 puffs into the lungs every 6 (six) hours as needed for wheezing or shortness of breath. INHALE 2 PUFFS INTO THE LUNGS EVERY 6 HOURS AS NEEDED FOR WHEEZING OR SHORTNESS OF BREATH 3 Inhaler 3  . busPIRone (BUSPAR) 15 MG tablet Take 15 mg by mouth 3 (three) times daily.    . cariprazine (VRAYLAR) capsule Take 1.5 mg by mouth daily.    . Cholecalciferol (VITAMIN D) 50 MCG (  2000 UT) tablet Take 1 tablet (2,000 Units total) by mouth daily.    . Coenzyme Q-10 200 MG CAPS Take 200 mg by mouth daily.    . Cyanocobalamin (B-12) 5000 MCG CAPS Take 5,000 mcg by mouth daily.    Marland Kitchen EPINEPHrine (EPI-PEN) 0.3 mg/0.3 mL DEVI Inject 0.3 mg into the muscle once as needed (anaphylaxis).    Marland Kitchen esomeprazole (NEXIUM) 40 MG capsule Take 40 mg by mouth every other day.     . ezetimibe (ZETIA) 10 MG tablet TAKE 1 TABLET BY MOUTH EVERYDAY AT BEDTIME 90 tablet 1  . fexofenadine (ALLEGRA) 180 MG tablet Take 180 mg by mouth as needed.     Marland Kitchen FLUoxetine (PROZAC) 10 MG tablet Take 20 mg by mouth in the morning and at bedtime.    . fluticasone (FLONASE) 50 MCG/ACT nasal spray USE 2 SPRAYS INTO THE NOSE EVERY 12 HOURS AS NEEDED FOR STUFFY NOSE 48 mL 2  . Fluticasone-Salmeterol (ADVAIR) 100-50 MCG/DOSE AEPB Inhale 1 puff into the lungs 2 (two) times daily. 60 each 5  . furosemide (LASIX) 20 MG tablet TAKE 1 TABLET (20 MG TOTAL) BY MOUTH DAILY AS NEEDED FOR FLUID. 90 tablet 1  . glucose blood (ONETOUCH VERIO) test strip Use as instructed to check sugar 2-3x time daily 200 each 5  . GLUTATHIONE PO Take 5 drops by mouth in the morning and at bedtime.    . Iron, Ferrous Sulfate, 325 (65 Fe) MG TABS Take 325 mg by mouth daily. 90 tablet 3  . metFORMIN (GLUCOPHAGE) 500 MG tablet Take 1 tablet (500 mg total) by mouth daily with supper. 90 tablet 3  .  Multiple Vitamins-Minerals (ZINC PO) Take by mouth.    . nitroGLYCERIN (NITROSTAT) 0.4 MG SL tablet Place 1 tablet (0.4 mg total) under the tongue every 5 (five) minutes as needed for chest pain. 25 tablet 3  . ONETOUCH DELICA LANCETS FINE MISC USE TO CHECK SUGAR 1 TIME DAILY 100 each 5  . ONETOUCH VERIO test strip USE AS INSTRUCTED TO CHECK SUGAR 1 TIME DAILY 100 strip 8  . rosuvastatin (CRESTOR) 10 MG tablet TAKE 1 TABLET (10 MG TOTAL) BY MOUTH EVERY OTHER DAY. 45 tablet 2  . Spacer/Aero Chamber [employer] Use with  inhaler as needed.  J44.9 1 each 0  . SYNTHROID 150 MCG tablet Take 1 tablet (150 mcg total) by mouth daily before breakfast. 45 tablet 5  . valsartan (DIOVAN) 160 MG tablet TAKE ONE HALF TABLET (80 MG TOTAL) 2 TIMES DAILY. 90 tablet 1  . VITAMIN A PO Take 600 mcg by mouth.    Marland Kitchen VITAMIN D, CHOLECALCIFEROL, PO Take 15 mg by mouth.    . potassium chloride (K-DUR) 10 MEQ tablet Take 1 tablet (10 mEq total) by mouth daily as needed (Takes with furosemide doses.). 90 tablet 1   No current facility-administered medications for this encounter.     REVIEW OF SYSTEMS: On review of systems, the patient reports that she is doing pretty well overall. She continues O2 at 2-3 L via [place]. She denies any significant changes in her breathing. She is not having chest pain or tightness. No other complaints are verbalized.  PHYSICAL EXAM:  Unable to assess due to encounter type.   ECOG = 0 0 - Asymptomatic (Fully active, able to carry on all predisease activities without restriction)  1 - Symptomatic but completely ambulatory (Restricted in physically strenuous activity but ambulatory and able to carry out work of a light or  sedentary nature. For example, light housework, office work)  2 - Symptomatic, <50% in bed during the day (Ambulatory and capable of all self care but unable to carry out any work activities. Up and about more than 50% of waking hours)  3 - Symptomatic, >50% in bed, but  not bedbound (Capable of only limited self-care, confined to bed or chair 50% or more of waking hours)  4 - Bedbound (Completely disabled. Cannot carry on any self-care. Totally confined to bed or chair)  5 - Death   Eustace Pen MM, Creech RH, Tormey DC, et al. [phone]). "Toxicity and response criteria of the [facility]". [place] Oncol. 5 (6): 649-55    LABORATORY DATA:  Lab Results  Component Value Date   WBC 5.0 04/29/2020   HGB 11.0 (L) 04/29/2020   HCT 32.4 (L) 04/29/2020   MCV 91.1 04/29/2020   PLT 179.0 04/29/2020   Lab Results  Component Value Date   NA 140 01/25/2020   K 4.7 01/25/2020   CL 102 01/25/2020   CO2 28 01/25/2020   Lab Results  Component Value Date   ALT 9 01/25/2020   AST 11 01/25/2020   ALKPHOS 71 06/04/2019   BILITOT 0.4 01/25/2020      RADIOGRAPHY: CT CHEST W CONTRAST  Result Date: 07/03/2020 CLINICAL DATA:  Non-small cell lung cancer. Assess treatment response. History of RIGHT upper lobe cancer diagnosed in 2017 post XRT. EXAM: CT CHEST WITH CONTRAST TECHNIQUE: Multidetector CT imaging of the chest was performed during intravenous contrast administration. CONTRAST:  22mL OMNIPAQUE IOHEXOL 300 MG/ML  SOLN COMPARISON:  December 03, 2019 FINDINGS: Cardiovascular: Calcified atheromatous plaque in the thoracic aorta. No aneurysmal dilation. Normal heart size without pericardial effusion. Three-vessel coronary artery disease. Normal appearance of central pulmonary vessels. Mediastinum/Nodes: Esophagus grossly normal. No thoracic inlet lymphadenopathy. No mediastinal adenopathy. No hilar lymphadenopathy. Lungs/Pleura: Increased septal thickening and ground-glass surrounding nodular changes in the RIGHT upper lobe. Nodular area in the central portion of this region measuring 1.3 x 0.6 cm previously 1.7 x 1.1 cm. Increased bandlike thickening along in area that extends towards the major fissure in the RIGHT upper lobe. Thickness increased  since prior study measuring approximately 5 mm greatest thickness, previously approximately 4 mm. Scattered small pulmonary nodules elsewhere in the upper lobe with similar appearance. Millimetric nodules in the LEFT upper lobe unchanged. LEFT lower lobe pulmonary nodule image 108/5 4 mm unchanged. No effusion. No consolidation. Mild bronchial wall thickening to the lower lobes with similar appearance. Pulmonary emphysema as before. Upper Abdomen: Large hepatic hemangioma arising from the inferior liver measures approximately 4.2 cm greatest dimension. Hypervascular changes in the RIGHT hepatic lobe also likely small hemangioma present since 2016. Mildly lobular hepatic contours similar to prior imaging. Fissural widening. Post cholecystectomy. Imaged portions of pancreas, spleen, adrenal glands and kidneys are unremarkable. No acute gastrointestinal process. Musculoskeletal: Spinal degenerative changes without acute or destructive bone process. Unchanged sclerotic lesion in LEFT-sided seventh rib. IMPRESSION: 1. Decreased size of nodule amidst evolving post treatment changes in the RIGHT upper lobe. No new or progressive findings. 2. Bandlike area extending to the superior aspect of the major fissure favored to represent post treatment change, attention on follow-up. 3. Stable small pulmonary nodules elsewhere in the upper lobes. 4. Unchanged sclerotic lesion in the LEFT-sided seventh rib. 5. Three-vessel coronary artery disease. 6. Large hepatic hemangioma. 7. Aortic atherosclerosis and pulmonary emphysema. Aortic Atherosclerosis (ICD10-I70.0) and Emphysema (ICD10-J43.9). Electronically Signed   By: Cay Schillings  Wile M.D.   On: 07/03/2020 16:01       IMPRESSION/PLAN: 1. Putative Stage IA3, cT1cN0M0, NSCLC of the RUL.  We discussed the results of her CT scan and fortunately it appears that radiographically she doe snot have active disease. We discussed the importance of being followed and that NCCN guidelines  recommend scans at 6 month intervals. She is in agreement and will contact us sooner if she has questions or concerns prior to that next interval. 2. O2 dependent COPD. The patient will continue with Dr. Halford Chessman for follow up and we will follow her course expectantly.      Given current concerns for patient exposure during the COVID-19 pandemic, this encounter was conducted via telephone.  The patient has provided two factor identification and has given verbal consent for this type of encounter and has been advised to only accept a meeting of this type in a secure network environment. The time spent during this encounter was 30 minutes including preparation, discussion, and coordination of the patient's care. The attendants for this meeting [place]  and [name].  During the encounter,  Hayden Pedro was located at [facility] Radiation Oncology Department.  AILEEN AMORE was located at home, her daughter Adah Salvage was located at her own home.      The above documentation reflects my direct findings during this shared patient visit. Please see the separate note by Dr. Lisbeth Renshaw on this date for the remainder of the patient's plan of care.    Carola Rhine, PAC

## 2020-07-16 ENCOUNTER — Telehealth: Payer: Self-pay | Admitting: Cardiovascular Disease

## 2020-07-16 ENCOUNTER — Encounter: Payer: Self-pay | Admitting: Family Medicine

## 2020-07-16 NOTE — Telephone Encounter (Signed)
Patient calling to request Dr. Rockey Situ review recent CT results for noted cardiac issues.     Please call.

## 2020-07-17 NOTE — Telephone Encounter (Signed)
Ct reviewed There is coronary and aortic atherosclerosis She smoked and diabetes, so to be expected Cholesterol and diabetes currently well controlled, no further workup unless she is having angina sx

## 2020-07-29 ENCOUNTER — Other Ambulatory Visit: Payer: Self-pay

## 2020-07-29 ENCOUNTER — Other Ambulatory Visit (INDEPENDENT_AMBULATORY_CARE_PROVIDER_SITE_OTHER): Payer: Medicare Other

## 2020-07-29 DIAGNOSIS — D649 Anemia, unspecified: Secondary | ICD-10-CM

## 2020-07-29 LAB — CBC WITH DIFFERENTIAL/PLATELET
Basophils Absolute: 0 10*3/uL (ref 0.0–0.1)
Basophils Relative: 0.6 % (ref 0.0–3.0)
Eosinophils Absolute: 0.1 10*3/uL (ref 0.0–0.7)
Eosinophils Relative: 1.3 % (ref 0.0–5.0)
HCT: 36.8 % (ref 36.0–46.0)
Hemoglobin: 12.3 g/dL (ref 12.0–15.0)
Lymphocytes Relative: 25 % (ref 12.0–46.0)
Lymphs Abs: 1.5 10*3/uL (ref 0.7–4.0)
MCHC: 33.3 g/dL (ref 30.0–36.0)
MCV: 92.4 fl (ref 78.0–100.0)
Monocytes Absolute: 0.5 10*3/uL (ref 0.1–1.0)
Monocytes Relative: 8.4 % (ref 3.0–12.0)
Neutro Abs: 3.9 10*3/uL (ref 1.4–7.7)
Neutrophils Relative %: 64.7 % (ref 43.0–77.0)
Platelets: 179 10*3/uL (ref 150.0–400.0)
RBC: 3.98 Mil/uL (ref 3.87–5.11)
RDW: 14.7 % (ref 11.5–15.5)
WBC: 6.1 10*3/uL (ref 4.0–10.5)

## 2020-07-29 LAB — IRON: Iron: 233 ug/dL — ABNORMAL HIGH (ref 42–145)

## 2020-07-29 NOTE — Addendum Note (Signed)
Addended by: Ellamae Sia on: 07/29/2020 02:09 PM   Modules accepted: Orders

## 2020-07-31 ENCOUNTER — Other Ambulatory Visit: Payer: Self-pay | Admitting: Family Medicine

## 2020-07-31 DIAGNOSIS — D649 Anemia, unspecified: Secondary | ICD-10-CM

## 2020-08-12 DIAGNOSIS — F4323 Adjustment disorder with mixed anxiety and depressed mood: Secondary | ICD-10-CM | POA: Diagnosis not present

## 2020-09-10 ENCOUNTER — Telehealth: Payer: Self-pay

## 2020-09-10 NOTE — Telephone Encounter (Signed)
Spoke to patient's daughter and advised her if her mom is dehydrated she may need IV fluids and should go to the ER. Lavella Lemons stated that she already has an appointment scheduled for her mom with Dr. [name] Tuesday and she is just  trying to move it up. Advised patient's daughter if she has a UTI and problems with her oxygen this should not wait until Tuesday. Patient's daughter stated that her mom will not see anyone other than Dr. [name]. Patient's daughter stated that her mom will not go to the ER and daughter insisted that the appointment be moved up with Dr. [name]. Earl Many that our recommendation with all of her symptoms that she should probably go to the ER which her daughter stated that she will not do that. Appointment scheduled with Dr. [name] 09/12/20 at 9:00 am. Lavella Lemons stated not to cancel the appointment for Tuesday yet until she talks with her mom to see if she will come in Friday. Lavella Lemons stated that she will call back and cancel which ever her mom will not come to.

## 2020-09-10 NOTE — Telephone Encounter (Signed)
Alexandria [name], patient's daughter, calling stating that patient needs to see [name] this week. Alexandria [name] states patient may have UTI, or dehydration or not getting enough Oxygen. I was not able to get more information on her symptoms. Sending to triage.

## 2020-09-10 NOTE — Telephone Encounter (Signed)
I agree with the advice you gave her about being seen sooner.  I am not in clinic to see patient today.

## 2020-09-12 ENCOUNTER — Encounter: Payer: Self-pay | Admitting: Family Medicine

## 2020-09-12 ENCOUNTER — Ambulatory Visit (INDEPENDENT_AMBULATORY_CARE_PROVIDER_SITE_OTHER): Payer: Medicare Other | Admitting: Family Medicine

## 2020-09-12 ENCOUNTER — Other Ambulatory Visit: Payer: Self-pay

## 2020-09-12 VITALS — BP 122/82 | HR 80 | Temp 97.5°F | Ht 65.0 in | Wt 217.0 lb

## 2020-09-12 DIAGNOSIS — D509 Iron deficiency anemia, unspecified: Secondary | ICD-10-CM

## 2020-09-12 DIAGNOSIS — F339 Major depressive disorder, recurrent, unspecified: Secondary | ICD-10-CM

## 2020-09-12 DIAGNOSIS — D649 Anemia, unspecified: Secondary | ICD-10-CM

## 2020-09-12 DIAGNOSIS — R5383 Other fatigue: Secondary | ICD-10-CM

## 2020-09-12 DIAGNOSIS — F419 Anxiety disorder, unspecified: Secondary | ICD-10-CM | POA: Diagnosis not present

## 2020-09-12 LAB — CBC WITH DIFFERENTIAL/PLATELET
Basophils Absolute: 0 10*3/uL (ref 0.0–0.1)
Basophils Relative: 0.4 % (ref 0.0–3.0)
Eosinophils Absolute: 0.1 10*3/uL (ref 0.0–0.7)
Eosinophils Relative: 1.5 % (ref 0.0–5.0)
HCT: 35.7 % — ABNORMAL LOW (ref 36.0–46.0)
Hemoglobin: 11.4 g/dL — ABNORMAL LOW (ref 12.0–15.0)
Lymphocytes Relative: 19.4 % (ref 12.0–46.0)
Lymphs Abs: 1.2 10*3/uL (ref 0.7–4.0)
MCHC: 32 g/dL (ref 30.0–36.0)
MCV: 92 fl (ref 78.0–100.0)
Monocytes Absolute: 0.6 10*3/uL (ref 0.1–1.0)
Monocytes Relative: 9.3 % (ref 3.0–12.0)
Neutro Abs: 4.2 10*3/uL (ref 1.4–7.7)
Neutrophils Relative %: 69.4 % (ref 43.0–77.0)
Platelets: 200 10*3/uL (ref 150.0–400.0)
RBC: 3.88 Mil/uL (ref 3.87–5.11)
RDW: 14.4 % (ref 11.5–15.5)
WBC: 6 10*3/uL (ref 4.0–10.5)

## 2020-09-12 LAB — COMPREHENSIVE METABOLIC PANEL
ALT: 8 U/L (ref 0–35)
AST: 10 U/L (ref 0–37)
Albumin: 3.9 g/dL (ref 3.5–5.2)
Alkaline Phosphatase: 87 U/L (ref 39–117)
BUN: 29 mg/dL — ABNORMAL HIGH (ref 6–23)
CO2: 35 mEq/L — ABNORMAL HIGH (ref 19–32)
Calcium: 9.1 mg/dL (ref 8.4–10.5)
Chloride: 103 mEq/L (ref 96–112)
Creatinine, Ser: 0.98 mg/dL (ref 0.40–1.20)
GFR: 53.56 mL/min — ABNORMAL LOW (ref >60.00)
Glucose, Bld: 157 mg/dL — ABNORMAL HIGH (ref 70–99)
Potassium: 4.3 mEq/L (ref 3.5–5.1)
Sodium: 143 mEq/L (ref 135–145)
Total Bilirubin: 0.3 mg/dL (ref 0.2–1.2)
Total Protein: 6.7 g/dL (ref 6.0–8.3)

## 2020-09-12 LAB — IRON: Iron: 40 ug/dL — ABNORMAL LOW (ref 42–145)

## 2020-09-12 LAB — TSH: TSH: 3.6 u[IU]/mL (ref 0.35–5.50)

## 2020-09-12 MED ORDER — CARIPRAZINE HCL 3 MG PO CAPS: 3.0000 mg | ORAL_CAPSULE | Freq: Every day | ORAL | 0 refills | Status: DC

## 2020-09-12 NOTE — Patient Instructions (Addendum)
Go to the lab on the way out.   If you have mychart we'll likely use that to update you.    Take care.  Glad to see you. Change vraylar from 4.5mg  to 3mg  a day.  Please call [name] in the meantime.  We'll call about a second opinion.

## 2020-09-12 NOTE — Progress Notes (Signed)
On noteThis visit occurred during the SARS-CoV-2 public health emergency.  Safety protocols were in place, including screening questions prior to the visit, additional usage of staff PPE, and extensive cleaning of exam room while observing appropriate contact time as indicated for disinfecting solutions.  Recheck pulse 80 and verified.  Initial rate of 44 likely false reading on pulse ox meter due to nail polish.    "I'm at my lowest point."   Sick for about 6 weeks.  Sleeping 18-20 hours a day.  Still on O2.  Vraylar increase coincides with sx onset/worsening.  Has been seeing [name] at [facility].  Lightheaded, tremor noted, more irritable.  She gets overwhelmed.  Slower to respond to questions.  Noted mouth tremor in the meantime.    History of anemia.  Off iron in the meantime.  Recheck labs pending.    Meds, vitals, and allergies reviewed.   ROS: Per HPI unless specifically indicated in ROS section   GEN: nad, alert and oriented, with baseline. HEENT: ncat, jaw tremor noted NECK: supple w/o LA CV: rrr. PULM: ctab, no inc wob ABD: soft, +bs EXT: no edema SKIN: Well-perfused. Hand tremor noted.  Speech is fluent but slower pace compared to her baseline.  30 minutes were devoted to patient care in this encounter (this includes time spent reviewing the patient's file/history, interviewing and examining the patient, counseling/reviewing plan with patient).

## 2020-09-14 ENCOUNTER — Other Ambulatory Visit: Payer: Self-pay | Admitting: Family Medicine

## 2020-09-14 DIAGNOSIS — D649 Anemia, unspecified: Secondary | ICD-10-CM

## 2020-09-14 MED ORDER — IRON (FERROUS SULFATE) 325 (65 FE) MG PO TABS: 325.0000 mg | ORAL_TABLET | ORAL | 3 refills | Status: DC

## 2020-09-14 NOTE — Assessment & Plan Note (Addendum)
With worsening symptoms corresponding to increase in Vraylar dose.  Discussed options.  Patient and family wanted second opinion.  Referral placed.  Decrease Vraylar to 3 mg a day.  Check routine labs today.  See notes on labs.  Additional taper of Vraylar/other psychiatric medication changes will need to come through psychiatry.

## 2020-09-14 NOTE — Assessment & Plan Note (Signed)
History of.  Recheck labs pending.  Off iron currently.

## 2020-09-16 ENCOUNTER — Ambulatory Visit: Payer: Medicare Other | Admitting: Family Medicine

## 2020-09-16 ENCOUNTER — Encounter: Payer: Self-pay | Admitting: Family Medicine

## 2020-09-26 ENCOUNTER — Other Ambulatory Visit: Payer: Self-pay | Admitting: Family Medicine

## 2020-09-26 MED ORDER — CARIPRAZINE HCL 1.5 MG PO CAPS: 1.5000 mg | ORAL_CAPSULE | Freq: Every day | ORAL | 0 refills | Status: DC

## 2020-10-07 ENCOUNTER — Encounter: Payer: Self-pay | Admitting: Family Medicine

## 2020-10-13 ENCOUNTER — Other Ambulatory Visit: Payer: Self-pay

## 2020-10-13 ENCOUNTER — Ambulatory Visit (INDEPENDENT_AMBULATORY_CARE_PROVIDER_SITE_OTHER): Payer: BC Managed Care – PPO | Admitting: Family Medicine

## 2020-10-13 ENCOUNTER — Telehealth: Payer: Self-pay

## 2020-10-13 ENCOUNTER — Encounter: Payer: Self-pay | Admitting: Family Medicine

## 2020-10-13 VITALS — BP 126/72 | HR 82 | Temp 97.7°F | Ht 65.0 in | Wt 212.0 lb

## 2020-10-13 DIAGNOSIS — Z20822 Contact with and (suspected) exposure to covid-19: Secondary | ICD-10-CM | POA: Diagnosis not present

## 2020-10-13 DIAGNOSIS — J441 Chronic obstructive pulmonary disease with (acute) exacerbation: Secondary | ICD-10-CM

## 2020-10-13 DIAGNOSIS — R059 Cough, unspecified: Secondary | ICD-10-CM

## 2020-10-13 DIAGNOSIS — J9611 Chronic respiratory failure with hypoxia: Secondary | ICD-10-CM

## 2020-10-13 DIAGNOSIS — I5032 Chronic diastolic (congestive) heart failure: Secondary | ICD-10-CM | POA: Diagnosis not present

## 2020-10-13 MED ORDER — PREDNISONE 20 MG PO TABS: ORAL_TABLET | ORAL | 0 refills | Status: DC

## 2020-10-13 MED ORDER — DOXYCYCLINE HYCLATE 100 MG PO TABS: 100.0000 mg | ORAL_TABLET | Freq: Two times a day (BID) | ORAL | 0 refills | Status: DC

## 2020-10-13 NOTE — Telephone Encounter (Signed)
Please see note below access nurse note. 

## 2020-10-13 NOTE — Telephone Encounter (Signed)
I thank all involved.

## 2020-10-13 NOTE — Telephone Encounter (Signed)
[name] Contact Type Call Who Is Calling Patient / Member / Family / Caregiver Caller Name Alexandria [name] Caller Phone Number [phone] Patient Name Alexandria [name] Patient DOB 08-17-1935 Call Type Message Only Information Provided Reason for Call Request to Schedule Office Appointment Initial Comment Caller states that she is wanting an appointment for her mother for tomorrow. Patient request to speak to RN No Additional Comment Office hours provided. Caller states that she would like an appointment between 11AM-2PM. Caller declined triage. Disp. Time Disposition Final User 10/12/2020 4:14:47 PM General Information Provided Yes Perla-Benitez, Jocelyn Call Closed By: Claris Gladden Transaction Date/Time: 10/12/2020 4:12:02 PM (ET)

## 2020-10-13 NOTE — Telephone Encounter (Signed)
Lavella Lemons (DPR signed) and pt is on phone; starting on 10/09/20 pt has prod wet cough but not a lot of phlegm; runny nose all the time due to being on Oxygen 24/7 at 2.5 - 3 L; pt has SOB. No other covid symptoms per pt; no fever. Pt has COPD and tested neg for home covid test on 10/12/20; pt has 2 covid vaccines and one booster.  [name] said thought OK to bring in office for face to face visit but does not have any available appts;  Dr Lorelei Pont will see pt in office on 10/13/20 at  12 noon. UC & ED precautions given and pts daughter voiced understanding. Sending note to [name] as PCP and Dr Lorelei Pont.

## 2020-10-13 NOTE — Progress Notes (Signed)
Alexandria [name] T. Alexandria Osmanovic, MD, [name], [address]  Phone: [phone]  FAX: [phone]  [name] - 85 y.o. female  MRN [id number]  Date of Birth: 08/09/1935  Date: 10/13/2020  PCP: Tonia Ghent, MD  Referral: Tonia Ghent, MD  Chief Complaint  Patient presents with   Cough    Productive     This visit occurred during the SARS-CoV-2 public health emergency.  Safety protocols were in place, including screening questions prior to the visit, additional usage of staff PPE, and extensive cleaning of exam room while observing appropriate contact time as indicated for disinfecting solutions.   Subjective:   Alexandria [name] is a 85 y.o. very pleasant female patient with Body mass index is 35.28 kg/m. who presents with the following:  Wet cough in the last few days. Congestion all of the sudden and it got tight in her test.  Took a fluid pill yesterday, and thought maybe fluid in her chest.   3 1/2 days ago, started.  All of this is distinctly new, not typical for acute, general state of health.  HA Long-term tremor.   A home COVID test was negative.  Husband is in [facility] for nine days.  In the hosptial before.  She is going back and forth.Has been to visit at [facility] and Sat.  Recent exposures in the hospital as well as the nursing home.  COPD and CAD with CHF.  Did have some nausea No v No d  No f  Not using advair - not doing every day.  Last 1 week ago.   Lasix - yesetrfday.  Not sure if it helped.   Doxy Steroids  Check covid  Lab Results  Component Value Date   HGBA1C 5.1 04/29/2020    PNA, COPD exac Covid vs bact  Review of Systems is noted in the HPI, as appropriate  Objective:   BP 126/72   Pulse 82   Temp 97.7 F (36.5 C) (Temporal)   Ht 5\' 5"  (1.651 m)   Wt 212 lb (96.2 kg)   LMP  (LMP Unknown)   SpO2 93%   BMI 35.28 kg/m   GEN: No  acute distress; alert,appropriate. PULM: Breathing comfortably in no respiratory distress.  Diffuse wheezing throughout all lung fields.  No crackles. PSYCH: Normally interactive.  CV: RRR, no m/g/r    Laboratory and Imaging Data:  Assessment and Plan:     ICD-10-CM   1. Suspected COVID-19 virus infection  Z20.822 Novel Coronavirus, NAA ([facility])    Novel Coronavirus, NAA ([facility])    2. Cough  R05.9 Novel Coronavirus, NAA ([facility])    Novel Coronavirus, NAA ([facility])    3. COPD exacerbation (HCC)  J44.1 Novel Coronavirus, NAA ([facility])    Novel Coronavirus, NAA ([facility])    4. Chronic respiratory failure with hypoxia (HCC)  J96.11 Novel Coronavirus, NAA ([facility])    Novel Coronavirus, NAA ([facility])    5. Chronic diastolic CHF (congestive heart failure) (HCC)  I50.32 Novel Coronavirus, NAA ([facility])    Novel Coronavirus, NAA ([facility])     Acute on chronic respiratory failure, COPD exacerbation.  Placed the patient on oral steroids.  Albuterol as needed. Long-term, she would be better served using her maintenance medication daily.  Pneumonia versus COVID versus viral bronchitis.  For now start antibiotics, and if her COVID test does come back positive initiate antivirals.  Meds ordered this encounter  Medications  doxycycline (VIBRA-TABS) 100 MG tablet    Sig: Take 1 tablet (100 mg total) by mouth 2 (two) times daily.    Dispense:  20 tablet    Refill:  0   predniSONE (DELTASONE) 20 MG tablet    Sig: 2 tabs po for 4 days, then 1 tab po for 4 days    Dispense:  12 tablet    Refill:  0   There are no discontinued medications. Orders Placed This Encounter  Procedures   Novel Coronavirus, NAA ([facility])    Follow-up: No follow-ups on file.  Dragon [employer] One speech-to-text software was used for transcription in this dictation.  Possible transcriptional errors can occur using Editor, commissioning.   Signed,  Maud Deed. Andria Head, MD   Outpatient Encounter Medications as  of 10/13/2020  Medication Sig   albuterol (PROAIR HFA) 108 (90 Base) MCG/ACT inhaler Inhale 2 puffs into the lungs every 6 (six) hours as needed for wheezing or shortness of breath. INHALE 2 PUFFS INTO THE LUNGS EVERY 6 HOURS AS NEEDED FOR WHEEZING OR SHORTNESS OF BREATH   busPIRone (BUSPAR) 15 MG tablet Take 15 mg by mouth 3 (three) times daily.   cariprazine (VRAYLAR) 1.5 MG capsule Take 1 capsule (1.5 mg total) by mouth daily. Tapering dose from 3mg  to 1.5mg  then stopping after 2.5 weeks.   Cholecalciferol (VITAMIN D) 50 MCG (2000 UT) tablet Take 1 tablet (2,000 Units total) by mouth daily.   Coenzyme Q-10 200 MG CAPS Take 200 mg by mouth daily.   Cyanocobalamin (B-12) 5000 MCG CAPS Take 5,000 mcg by mouth daily.   doxycycline (VIBRA-TABS) 100 MG tablet Take 1 tablet (100 mg total) by mouth 2 (two) times daily.   EPINEPHrine (EPI-PEN) 0.3 mg/0.3 mL DEVI Inject 0.3 mg into the muscle once as needed (anaphylaxis).   esomeprazole (NEXIUM) 40 MG capsule Take 40 mg by mouth every other day.    ezetimibe (ZETIA) 10 MG tablet TAKE 1 TABLET BY MOUTH EVERYDAY AT BEDTIME   fexofenadine (ALLEGRA) 180 MG tablet Take 180 mg by mouth as needed.    FLUoxetine (PROZAC) 10 MG tablet Take 20 mg by mouth daily.   fluticasone (FLONASE) 50 MCG/ACT nasal spray USE 2 SPRAYS INTO THE NOSE EVERY 12 HOURS AS NEEDED FOR STUFFY NOSE   Fluticasone-Salmeterol (ADVAIR) 100-50 MCG/DOSE AEPB Inhale 1 puff into the lungs 2 (two) times daily.   furosemide (LASIX) 20 MG tablet TAKE 1 TABLET (20 MG TOTAL) BY MOUTH DAILY AS NEEDED FOR FLUID.   glucose blood (ONETOUCH VERIO) test strip Use as instructed to check sugar 2-3x time daily   GLUTATHIONE PO Take 5 drops by mouth in the morning and at bedtime.   Iron, Ferrous Sulfate, 325 (65 Fe) MG TABS Take 325 mg by mouth every other day.   metFORMIN (GLUCOPHAGE) 500 MG tablet Take 1 tablet (500 mg total) by mouth daily with supper.   Multiple Vitamins-Minerals (ZINC PO) Take by mouth.    nitroGLYCERIN (NITROSTAT) 0.4 MG SL tablet Place 1 tablet (0.4 mg total) under the tongue every 5 (five) minutes as needed for chest pain.   ONETOUCH DELICA LANCETS FINE MISC USE TO CHECK SUGAR 1 TIME DAILY   ONETOUCH VERIO test strip USE AS INSTRUCTED TO CHECK SUGAR 1 TIME DAILY   predniSONE (DELTASONE) 20 MG tablet 2 tabs po for 4 days, then 1 tab po for 4 days   rosuvastatin (CRESTOR) 10 MG tablet TAKE 1 TABLET (10 MG TOTAL) BY MOUTH EVERY OTHER DAY.  Spacer/Aero Chamber [employer] Use with  inhaler as needed.  J44.9   SYNTHROID 150 MCG tablet Take 1 tablet (150 mcg total) by mouth daily before breakfast.   valsartan (DIOVAN) 160 MG tablet TAKE ONE HALF TABLET (80 MG TOTAL) 2 TIMES DAILY.   VITAMIN A PO Take 600 mcg by mouth.   VITAMIN D, CHOLECALCIFEROL, PO Take 15 mg by mouth.   potassium chloride (K-DUR) 10 MEQ tablet Take 1 tablet (10 mEq total) by mouth daily as needed (Takes with furosemide doses.).   No facility-administered encounter medications on file as of 10/13/2020.

## 2020-10-14 LAB — SARS-COV-2, NAA 2 DAY TAT

## 2020-10-14 LAB — NOVEL CORONAVIRUS, NAA: SARS-CoV-2, NAA: NOT DETECTED

## 2020-10-28 ENCOUNTER — Ambulatory Visit (INDEPENDENT_AMBULATORY_CARE_PROVIDER_SITE_OTHER): Payer: Medicare Other | Admitting: Internal Medicine

## 2020-10-28 ENCOUNTER — Encounter: Payer: Self-pay | Admitting: Internal Medicine

## 2020-10-28 ENCOUNTER — Other Ambulatory Visit: Payer: Self-pay

## 2020-10-28 VITALS — BP 118/58 | HR 89 | Ht 65.0 in | Wt 211.8 lb

## 2020-10-28 DIAGNOSIS — E785 Hyperlipidemia, unspecified: Secondary | ICD-10-CM | POA: Diagnosis not present

## 2020-10-28 DIAGNOSIS — E039 Hypothyroidism, unspecified: Secondary | ICD-10-CM | POA: Diagnosis not present

## 2020-10-28 DIAGNOSIS — E1140 Type 2 diabetes mellitus with diabetic neuropathy, unspecified: Secondary | ICD-10-CM

## 2020-10-28 LAB — POCT GLYCOSYLATED HEMOGLOBIN (HGB A1C): Hemoglobin A1C: 5 % (ref 4.0–5.6)

## 2020-10-28 NOTE — Progress Notes (Signed)
Patient ID: Alexandria [name], female   DOB: [birth year], 85 y.o.   MRN: [id number]  This visit occurred during the SARS-CoV-2 public health emergency.  Safety protocols were in place, including screening questions prior to the visit, additional usage of staff PPE, and extensive cleaning of exam room while observing appropriate contact time as indicated for disinfecting solutions.   HPI: Alexandria [name] is a 85 y.o.-year-old female, returning for follow-up for DM2, dx in 2006, non-insulin-dependent, controlled, with complications (mild CKD) and uncontrolled hypothyroidism. Last visit 6 months ago.  Interim history: She was diagnosed with lung cancer last year.  She had radiation therapy.  Continues to be short of breath and wears oxygen. Earlier this month, she had a URI >> r/o for COVID-19 infection. She still has congestion. She is eating smaller meals recently. She is having anxiety, which is worse. She also has blurry vision - with the new pair of glasses.  She feels she needs to have the cataract surgery done soon.  DM2: Reviewed HbA1c levels: Lab Results  Component Value Date   HGBA1C 5.1 04/29/2020   HGBA1C 5.3 10/31/2019   HGBA1C 5.4 05/02/2019   Pt is on: - Metformin 500 mg with dinner She was on Januvia >> stopped in summer 2016, when she was admitted for sepsis. We did not restart afterwards as her sugars remained controlled.  Pt is checking sugars once a day: - am: 80s-100s >> 116, 122 >> 97-107 >> 130-140  - 2h after brunch:  124-195 >> n/c >> 175 >> n/c - before lunch: 103, 109 >> n/c >> cannot remember - before dinner: 114-152 >> n/c >> 125 >> 105 >> n/c - 2h after dinner:  96-158 >> n/c >> up to 180 >> 90-120 - bedtime: 124, 147 >> n/c >> 133 >> n/c - nighttime: n/c >> 169, 175 >> n/c Lowest sugar was 80 >> 91 >> 97 >> 90; she has hypoglycemia awareness in the 60s Highest sugar was 280 >> 158 (sweets) >> 180-200 (banana, potatoes) >> 150.  Glucometer:One Touch Ultra  mini  Pt's meals are:  - Breakfast: protein drink, egg, cereals >> cottage cheese, pineapple, deviled egg + tomato or fruit - Lunch: sandwich - Dinner: meat + 2 veggies - Snacks: 1-3 peanut butter, milk, crackers  -+ Mild CKD, last BUN/creatinine:  Lab Results  Component Value Date   BUN 29 (H) 09/12/2020   CREATININE 0.98 09/12/2020  On valsartan.  -+ HL; last set of lipids: Lab Results  Component Value Date   CHOL 148 06/26/2019   HDL 60.00 06/26/2019   LDLCALC 69 06/26/2019   LDLDIRECT 63.0 07/29/2015   TRIG 93.0 06/26/2019   CHOLHDL 2 06/26/2019  On Crestor 10 every other day, Zetia 10 daily  - last eye exam was: 06/2020: No DR, + cataract. She was supposed to have cataract sx, but postponed. She will have the surgery soon.  -No numbness and tingling in her feet.  Hypothyroidism. -Uncontrolled -needs Synthroid DAW due to fluctuating TFTs.  Pt is on Synthroid 150 mcg daily, taken: - in am - fasting - at least 30 min from b'fast - + calcium, iron, multivitamins, acid reflux medicines more than 4 hours after levothyroxine - not on Biotin  Reviewed her TFTs: Lab Results  Component Value Date   TSH 3.60 09/12/2020   TSH 0.80 04/29/2020   TSH 0.29 (L) 01/25/2020   TSH 9.48 (H) 10/31/2019   TSH 6.56 (H) 08/30/2019   TSH 18.54 (H) 05/02/2019  TSH 41.98 (H) 11/01/2018   TSH 3.83 12/22/2017   TSH 0.80 02/09/2017   TSH 0.93 01/01/2016   TSH 1.75 03/14/2015   TSH 2.83 01/24/2015   TSH 0.18 (L) 12/04/2014   TSH 0.55 10/18/2014   TSH 0.341 (L) 09/29/2014   TSH 0.26 (L) 10/02/2010   TSH 0.34 02/19/2010   TSH 2.90 10/11/2007   TSH 5.56 (H) 01/12/2007   Pt denies: - feeling nodules in neck - hoarseness - dysphagia - choking - SOB with lying down  She has a h/o COPD- Dr. Halford Chessman, HTN, anemia, GERD. She fell in 09/2015 >> hurt R leg (had many stitches) and R orbit. She was in rehab afterwards.  ROS: + See HPI  I reviewed pt's medications, allergies, PMH,  social hx, family hx, and changes were documented in the history of present illness. Otherwise, unchanged from my initial visit note.  Past Medical History:  Diagnosis Date   Allergy, unspecified not elsewhere classified    Anxiety    Cataract    Chronic diastolic congestive heart failure (HCC)    COPD (chronic obstructive pulmonary disease) ([place]) 03/08/1998   PFTs 12/12/2002 FEV 1 1.42 (64%) ratio 58 with no better after B2 and DLCO75%; PFTs 11/20/09 FEV1 1.50 (73%) ratio 50 no better after B2 with DLCO 62%; Hfa 50% 11/20/2009 >75%, 01/13/10 p coaching   Depression 12/07/1998   Diabetes mellitus type II 02/05/2006   Dr. Cruzita Lederer with endo   Disorders of bursae and tendons in shoulder region, unspecified    Rotator cuff syndrome, right   E. coli bacteremia    Esophagitis    GERD (gastroesophageal reflux disease)    History of UTI    Hyperlipidemia 10/04/2000   Hypertension 03/08/1992   Hypothyroidism 03/08/1968   Iron deficiency anemia    Lung nodule    radiation starts 10-08-2019   Malignant neoplasm of right upper lobe of lung ([place]) 07/24/2019   Obesity    NOS   OSA (obstructive sleep apnea)    PSG 01/27/10 AHI 13, pt does not know CPAP settings   Peripheral neuropathy    Likely due to DM per Dr. Murvin Natal hernia    Past Surgical History:  Procedure Laterality Date   ABD U/S  03/19/1999   Nml x2 foci in liver   ADENOSINE MYOVIEW  06/02/2007   Nml   CARDIOLITE PERSANTINE  08/24/2000   Nml   CAROTID U/S  08/24/2000   1-39% ICA stenosis   CAROTID U/S  06/02/2007   No apprec change    CARPAL TUNNEL RELEASE  12/1997   Right   CESAREAN SECTION     x2 [place]/ repeat   CHOLECYSTECTOMY  1997   COLONOSCOPY WITH PROPOFOL N/A 02/12/2016   Procedure: COLONOSCOPY WITH PROPOFOL;  Surgeon: Milus Banister, MD;  Location: [facility];  Service: Endoscopy;  Laterality: N/A;   CT ABD W & PELVIS WO/W CM     Abd hemangiomas of liver, 1 cm R renal cyst   DENTAL SURGERY  2016    Implants   DEXA  07/03/2003   Nml   ESOPHAGOGASTRODUODENOSCOPY  12/05/1997   Nml (due to hoarseness)   ESOPHAGOGASTRODUODENOSCOPY (EGD) WITH PROPOFOL N/A 02/12/2016   Procedure: ESOPHAGOGASTRODUODENOSCOPY (EGD) WITH PROPOFOL;  Surgeon: Milus Banister, MD;  Location: [facility];  Service: Endoscopy;  Laterality: N/A;   GALLBLADDER SURGERY     HERNIA REPAIR  01/24/2009   Lap Ventr w/ Lysis of adhesions (Dr. Donne Hazel)   knee arthroscopic  surgery  years ago   right   ROTATOR CUFF REPAIR  1984   Right, [place]   SHOULDER OPEN ROTATOR CUFF REPAIR  02/08/2012   Procedure: ROTATOR CUFF REPAIR SHOULDER OPEN;  Surgeon: Magnus Sinning, MD;  Location: [facility];  Service: Orthopedics;  Laterality: Left;  Left Shoulder Open Anterior Acrominectomy Rotator Cuff Repair Open Distal Clavicle Resection ,tissue mend graft, and repair of biceps tendon   THUMB RELEASE  12/1997   Right   TONSILLECTOMY     TOTAL ABDOMINAL HYSTERECTOMY  1985   Due to dysmennorhea   US ECHOCARDIOGRAPHY  06/02/2007   Social History   Occupational History   Retired - self employed- Engineer, structural    Social History Main Topics   Smoking status: Former Smoker -- 2.00 packs/day for 50 years    Types: Cigarettes    Quit date: 2007   Smokeless tobacco: Never Used   Alcohol Use: 1.2 oz/week    2 Standard drinks or equivalent per week     Comment: occasional   Drug Use: No   Sexual Activity: Not on file   Social History Narrative   Married with 2 children   Enjoys painting   Current Outpatient Medications on File Prior to Visit  Medication Sig Dispense Refill   albuterol (PROAIR HFA) 108 (90 Base) MCG/ACT inhaler Inhale 2 puffs into the lungs every 6 (six) hours as needed for wheezing or shortness of breath. INHALE 2 PUFFS INTO THE LUNGS EVERY 6 HOURS AS NEEDED FOR WHEEZING OR SHORTNESS OF BREATH 3 Inhaler 3   busPIRone (BUSPAR) 15 MG tablet Take 15 mg by mouth 3 (three) times daily.     cariprazine (VRAYLAR)  1.5 MG capsule Take 1 capsule (1.5 mg total) by mouth daily. Tapering dose from 3mg  to 1.5mg  then stopping after 2.5 weeks. 17 capsule 0   Cholecalciferol (VITAMIN D) 50 MCG (2000 UT) tablet Take 1 tablet (2,000 Units total) by mouth daily.     Coenzyme Q-10 200 MG CAPS Take 200 mg by mouth daily.     Cyanocobalamin (B-12) 5000 MCG CAPS Take 5,000 mcg by mouth daily.     doxycycline (VIBRA-TABS) 100 MG tablet Take 1 tablet (100 mg total) by mouth 2 (two) times daily. 20 tablet 0   EPINEPHrine (EPI-PEN) 0.3 mg/0.3 mL DEVI Inject 0.3 mg into the muscle once as needed (anaphylaxis).     esomeprazole (NEXIUM) 40 MG capsule Take 40 mg by mouth every other day.      ezetimibe (ZETIA) 10 MG tablet TAKE 1 TABLET BY MOUTH EVERYDAY AT BEDTIME 90 tablet 1   fexofenadine (ALLEGRA) 180 MG tablet Take 180 mg by mouth as needed.      FLUoxetine (PROZAC) 10 MG tablet Take 20 mg by mouth daily.     fluticasone (FLONASE) 50 MCG/ACT nasal spray USE 2 SPRAYS INTO THE NOSE EVERY 12 HOURS AS NEEDED FOR STUFFY NOSE 48 mL 2   Fluticasone-Salmeterol (ADVAIR) 100-50 MCG/DOSE AEPB Inhale 1 puff into the lungs 2 (two) times daily. 60 each 5   furosemide (LASIX) 20 MG tablet TAKE 1 TABLET (20 MG TOTAL) BY MOUTH DAILY AS NEEDED FOR FLUID. 90 tablet 1   glucose blood (ONETOUCH VERIO) test strip Use as instructed to check sugar 2-3x time daily 200 each 5   GLUTATHIONE PO Take 5 drops by mouth in the morning and at bedtime.     Iron, Ferrous Sulfate, 325 (65 Fe) MG TABS Take 325 mg by mouth every  other day. 90 tablet 3   metFORMIN (GLUCOPHAGE) 500 MG tablet Take 1 tablet (500 mg total) by mouth daily with supper. 90 tablet 3   Multiple Vitamins-Minerals (ZINC PO) Take by mouth.     nitroGLYCERIN (NITROSTAT) 0.4 MG SL tablet Place 1 tablet (0.4 mg total) under the tongue every 5 (five) minutes as needed for chest pain. 25 tablet 3   ONETOUCH DELICA LANCETS FINE MISC USE TO CHECK SUGAR 1 TIME DAILY 100 each 5   ONETOUCH VERIO test  strip USE AS INSTRUCTED TO CHECK SUGAR 1 TIME DAILY 100 strip 8   potassium chloride (K-DUR) 10 MEQ tablet Take 1 tablet (10 mEq total) by mouth daily as needed (Takes with furosemide doses.). 90 tablet 1   predniSONE (DELTASONE) 20 MG tablet 2 tabs po for 4 days, then 1 tab po for 4 days 12 tablet 0   rosuvastatin (CRESTOR) 10 MG tablet TAKE 1 TABLET (10 MG TOTAL) BY MOUTH EVERY OTHER DAY. 45 tablet 2   Spacer/Aero Chamber Mouthpiece MISC Use with  inhaler as needed.  J44.9 1 each 0   SYNTHROID 150 MCG tablet Take 1 tablet (150 mcg total) by mouth daily before breakfast. 45 tablet 5   valsartan (DIOVAN) 160 MG tablet TAKE ONE HALF TABLET (80 MG TOTAL) 2 TIMES DAILY. 90 tablet 1   VITAMIN A PO Take 600 mcg by mouth.     VITAMIN D, CHOLECALCIFEROL, PO Take 15 mg by mouth.     No current facility-administered medications on file prior to visit.   Allergies  Allergen Reactions   Antihistamines, Diphenhydramine-Type Other (See Comments)    Able to tolerate only allegra.    Sulfonamide Derivatives Swelling    REACTION: closed throat   Incruse Ellipta [Umeclidinium Bromide] Cough    Caused voice change and severe coughing   Clarithromycin Other (See Comments)    REACTION: diff swallowing and mouth blisters   Codeine Other (See Comments)    Unknown    Lyrica [Pregabalin] Other (See Comments)    Lack of effect for neuropathy pain.     Spiriva Handihaler [Tiotropium Bromide Monohydrate] Other (See Comments)    Voice changes   Family History  Problem Relation Age of Onset   Heart disease Mother    Thyroid disease Mother    Emphysema Father        One lung   Cystic fibrosis Sister    Hyperthyroidism Sister    Osteoarthritis Brother    Hyperthyroidism Brother    Esophageal cancer Neg Hx    Cancer Neg Hx        Head or neck   Colon cancer Neg Hx    Stomach cancer Neg Hx    Breast cancer Neg Hx    PE: BP (!) 118/58 (BP Location: Right Arm, Patient Position: Sitting, Cuff Size:  Normal)   Pulse 89   Ht 5\' 5"  (1.651 m)   Wt 211 lb 12.8 oz (96.1 kg)   LMP  (LMP Unknown)   SpO2 96%   BMI 35.25 kg/m  Body mass index is 35.25 kg/m.  Wt Readings from Last 3 Encounters:  10/28/20 211 lb 12.8 oz (96.1 kg)  10/13/20 212 lb (96.2 kg)  09/12/20 217 lb (98.4 kg)   Constitutional: overweight, in NAD, + walks with a walker- on 3 L oxygen now Eyes: PERRLA, EOMI, no exophthalmos ENT: moist mucous membranes, no thyromegaly, no cervical lymphadenopathy Cardiovascular: RRR, No RG, +1/6 SEM, + R LE edema Respiratory: CTA  B Gastrointestinal: abdomen soft, NT, ND, BS+ Musculoskeletal: no deformities, strength intact in all 4 Skin: moist, warm, no rashes, + right LE erythema on the lower leg-chronic after surgery on her right knee; + dry skin Neurological: no tremor with outstretched hands, DTR normal in all 4  ASSESSMENT: 1. DM2, non-insulin-dependent, controlled, with complications - mild CKD  2. Hypothyroidism  3. HL  PLAN:  1. Patient with longstanding, previously uncontrolled type 2 diabetes, on low-dose metformin, with good control lately.  She continues on a minimalistic regimen only with metformin.  Latest HbA1c was 5.1% at last visit, excellent, but lower than expected from her blood sugars at home, most likely due to iron therapy for anemia.  She was not checking sugars consistently at that time and I advised her to start checking once a day, rotating check times. -At today's visit, per her recall, sugars appear to be controlled later in the day but they are slightly above target in the morning.  However, for her age, sugars in the 130s to 140 in the morning are acceptable.  She did forget her meter today and I advised her to bring it at next visit.  For now, I do not feel that she needs any changes in her regimen. - I advised her to: Patient Instructions  Please continue Synthroid 150 mcg daily.  Take the thyroid hormone every day, with water, at least 30 minutes  before breakfast, separated by at least 4 hours from: - acid reflux medications - calcium - iron - multivitamins  Please continue: - Metformin 500 mg with supper  Targets for blood sugars: - before meals: 80-130 - after meals (2h after): <180 - HbA1c <7%  Please return in 6 months with your sugar log.   - we checked her HbA1c: 5.0% even lower than before, likely also influenced by the iron therapy. - advised to check sugars at different times of the day - 1x a day, rotating check times - advised for yearly eye exams >> she is UTD - return to clinic in 6 months  2. Hypothyroidism -She has a history of not taking the levothyroxine - latest thyroid labs reviewed with pt. >> normal: Lab Results  Component Value Date   TSH 3.60 09/12/2020  - she continues on Synthroid d.a.w. 150 mcg daily - pt feels good on this dose. - we discussed about taking the thyroid hormone every day, with water, >30 minutes before breakfast, separated by >4 hours from acid reflux medications, calcium, iron, multivitamins. Pt. is taking it correctly.  3. HL -Reviewed latest lipid panel from 06/2019: All fractions at goal: Lab Results  Component Value Date   CHOL 148 06/26/2019   HDL 60.00 06/26/2019   LDLCALC 69 06/26/2019   LDLDIRECT 63.0 07/29/2015   TRIG 93.0 06/26/2019   CHOLHDL 2 06/26/2019  -Continues Crestor 10 mg every other day and Zetia 10 mg daily without side effects -She is due for another lipid panel -we will check this today (nonfasting)  She did not stop at the lab...  Philemon Kingdom, MD PhD [facility] Endocr[facility]

## 2020-10-28 NOTE — Patient Instructions (Addendum)
Please continue Synthroid 150 mcg daily.  Take the thyroid hormone every day, with water, at least 30 minutes before breakfast, separated by at least 4 hours from: - acid reflux medications - calcium - iron - multivitamins  Please continue: - Metformin 500 mg with supper  Targets for blood sugars: - before meals: 80-130 - after meals (2h after): <180 - HbA1c <7%  Please stop at the lab.  Please return in 6 months with your sugar log.

## 2020-10-31 DIAGNOSIS — F419 Anxiety disorder, unspecified: Secondary | ICD-10-CM | POA: Diagnosis not present

## 2020-11-20 ENCOUNTER — Other Ambulatory Visit: Payer: Medicare Other

## 2020-11-21 ENCOUNTER — Other Ambulatory Visit: Payer: Self-pay | Admitting: Family Medicine

## 2020-11-21 ENCOUNTER — Other Ambulatory Visit: Payer: Self-pay | Admitting: Internal Medicine

## 2020-11-21 ENCOUNTER — Other Ambulatory Visit (INDEPENDENT_AMBULATORY_CARE_PROVIDER_SITE_OTHER): Payer: Medicare Other

## 2020-11-21 ENCOUNTER — Other Ambulatory Visit: Payer: Self-pay

## 2020-11-21 DIAGNOSIS — D649 Anemia, unspecified: Secondary | ICD-10-CM | POA: Diagnosis not present

## 2020-11-21 DIAGNOSIS — E039 Hypothyroidism, unspecified: Secondary | ICD-10-CM

## 2020-11-21 LAB — CBC WITH DIFFERENTIAL/PLATELET
Basophils Absolute: 0 10*3/uL (ref 0.0–0.1)
Basophils Relative: 0.3 % (ref 0.0–3.0)
Eosinophils Absolute: 0.1 10*3/uL (ref 0.0–0.7)
Eosinophils Relative: 1.6 % (ref 0.0–5.0)
HCT: 26.7 % — ABNORMAL LOW (ref 36.0–46.0)
Hemoglobin: 8.4 g/dL — ABNORMAL LOW (ref 12.0–15.0)
Lymphocytes Relative: 20.6 % (ref 12.0–46.0)
Lymphs Abs: 1.2 10*3/uL (ref 0.7–4.0)
MCHC: 31.6 g/dL (ref 30.0–36.0)
MCV: 97.1 fl (ref 78.0–100.0)
Monocytes Absolute: 0.5 10*3/uL (ref 0.1–1.0)
Monocytes Relative: 9.6 % (ref 3.0–12.0)
Neutro Abs: 3.8 10*3/uL (ref 1.4–7.7)
Neutrophils Relative %: 67.9 % (ref 43.0–77.0)
Platelets: 227 10*3/uL (ref 150.0–400.0)
RBC: 2.75 Mil/uL — ABNORMAL LOW (ref 3.87–5.11)
RDW: 14.8 % (ref 11.5–15.5)
WBC: 5.6 10*3/uL (ref 4.0–10.5)

## 2020-11-21 LAB — IRON: Iron: 34 ug/dL — ABNORMAL LOW (ref 42–145)

## 2020-11-21 LAB — TSH: TSH: 0.73 u[IU]/mL (ref 0.35–5.50)

## 2020-11-21 LAB — T4, FREE: Free T4: 1.27 ng/dL (ref 0.60–1.60)

## 2020-11-21 NOTE — Addendum Note (Signed)
Addended by: Leeanne Rio on: 11/21/2020 09:47 AM   Modules accepted: Orders

## 2020-11-23 ENCOUNTER — Other Ambulatory Visit: Payer: Self-pay | Admitting: Family Medicine

## 2020-11-23 DIAGNOSIS — D649 Anemia, unspecified: Secondary | ICD-10-CM

## 2020-11-25 NOTE — Progress Notes (Signed)
LMTCB

## 2020-11-30 NOTE — Progress Notes (Deleted)
Cardiology Office Note  Date:  11/30/2020   ID:  Alexandria [name], DOB 09-09-1935, MRN [id number]  PCP:  Tonia Ghent, MD   No chief complaint on file.   HPI:  85 year old woman with  obesity,   long history of smoking for 50 years, COPD, quit 15 years DM2, dx in 2006, non-insulin-dependent, controlled, with  mild CKD hyperlipidemia,  hypertension,   2012  chest pain, stress test at that time with no ischemia, normal EF  July 2016 with COPD exacerbation, sepsis. sleep apnea, not on CPAP, seen by Dr. Llana Aliment, mask does not fit Chronic SOB, normal echo 09/2014 Diagnosis of lung cancer,  completed radiation , planned chemo She presents for follow-up of her shortness of breath symptoms  LOV 11/2019 Anxiety issues, Followed by psychiatry, Worse recently, dates back for months to years Started on buspar, Everything causing anxiety  Bathroom/bedroom "too small for her" Feeling  Sleeping in recliner in living room Knees and feet hurt "all the time"  Trace leg swelling r>L  Weight down 8 pounds from 11/2019  Some GI issues,  Slow motility, Certain foods upset stomach  Labs reviewed Total chol 148 LDL 69  EKG personally reviewed by myself on todays visit Shows normal sinus rhythm rate 74 bpm nonspecific St and T wave changes  Other PMH reviewed CT scan chest 06/2019: Subsolid 2.2 cm posterior apical right upper lobe pulmonary nodule with 1.0 cm solid component, mildly increased since 12/14/2018 CT, slowly increasing on multiple CT studies back to 2017. Findings are most compatible with a slow growing primary bronchogenic carcinoma.  PET scan Increased metabolic activity in the area of the part solid nodule in the RIGHT upper lobe. Findings are suspicious for indolent bronchogenic neoplasm.  Did XRT Declined chemo  Previous Escherichia coli sepsis,  At that time had anemia with hematocrit down to 25, iron deficiency anemia Treated for COPD exacerbation CT scan of  the abdomen showed mild descending aorta atherosclerosis   taking Crestor and zetia.   PMH:   has a past medical history of Allergy, unspecified not elsewhere classified, Anxiety, Cataract, Chronic diastolic congestive heart failure ([place]), COPD (chronic obstructive pulmonary disease) ([place]) (03/08/1998), Depression (12/07/1998), Diabetes mellitus type II (02/05/2006), Disorders of bursae and tendons in shoulder region, unspecified, E. coli bacteremia, Esophagitis, GERD (gastroesophageal reflux disease), History of UTI, Hyperlipidemia (10/04/2000), Hypertension (03/08/1992), Hypothyroidism (03/08/1968), Iron deficiency anemia, Lung nodule, Malignant neoplasm of right upper lobe of lung ([place]) (07/24/2019), Obesity, OSA (obstructive sleep apnea), Peripheral neuropathy, and Ventral hernia.  PSH:    Past Surgical History:  Procedure Laterality Date   ABD U/S  03/19/1999   Nml x2 foci in liver   ADENOSINE MYOVIEW  06/02/2007   Nml   CARDIOLITE PERSANTINE  08/24/2000   Nml   CAROTID U/S  08/24/2000   1-39% ICA stenosis   CAROTID U/S  06/02/2007   No apprec change    CARPAL TUNNEL RELEASE  12/1997   Right   CESAREAN SECTION     x2 [place]/ repeat   CHOLECYSTECTOMY  1997   COLONOSCOPY WITH PROPOFOL N/A 02/12/2016   Procedure: COLONOSCOPY WITH PROPOFOL;  Surgeon: Milus Banister, MD;  Location: [facility];  Service: Endoscopy;  Laterality: N/A;   CT ABD W & PELVIS WO/W CM     Abd hemangiomas of liver, 1 cm R renal cyst   DENTAL SURGERY  2016   Implants   DEXA  07/03/2003   Nml   ESOPHAGOGASTRODUODENOSCOPY  12/05/1997   Nml (  due to hoarseness)   ESOPHAGOGASTRODUODENOSCOPY (EGD) WITH PROPOFOL N/A 02/12/2016   Procedure: ESOPHAGOGASTRODUODENOSCOPY (EGD) WITH PROPOFOL;  Surgeon: Milus Banister, MD;  Location: [facility];  Service: Endoscopy;  Laterality: N/A;   GALLBLADDER SURGERY     HERNIA REPAIR  01/24/2009   Lap Ventr w/ Lysis of adhesions (Dr. Donne Hazel)   knee arthroscopic surgery   years ago   right   ROTATOR CUFF REPAIR  1984   Right, [place]   SHOULDER OPEN ROTATOR CUFF REPAIR  02/08/2012   Procedure: ROTATOR CUFF REPAIR SHOULDER OPEN;  Surgeon: Magnus Sinning, MD;  Location: [facility];  Service: Orthopedics;  Laterality: Left;  Left Shoulder Open Anterior Acrominectomy Rotator Cuff Repair Open Distal Clavicle Resection ,tissue mend graft, and repair of biceps tendon   THUMB RELEASE  12/1997   Right   TONSILLECTOMY     TOTAL ABDOMINAL HYSTERECTOMY  1985   Due to dysmennorhea   US ECHOCARDIOGRAPHY  06/02/2007    Current Outpatient Medications  Medication Sig Dispense Refill   albuterol (PROAIR HFA) 108 (90 Base) MCG/ACT inhaler Inhale 2 puffs into the lungs every 6 (six) hours as needed for wheezing or shortness of breath. INHALE 2 PUFFS INTO THE LUNGS EVERY 6 HOURS AS NEEDED FOR WHEEZING OR SHORTNESS OF BREATH 3 Inhaler 3   busPIRone (BUSPAR) 15 MG tablet Take 15 mg by mouth 3 (three) times daily.     cariprazine (VRAYLAR) 1.5 MG capsule Take 1 capsule (1.5 mg total) by mouth daily. Tapering dose from 3mg  to 1.5mg  then stopping after 2.5 weeks. 17 capsule 0   Cholecalciferol (VITAMIN D) 50 MCG (2000 UT) tablet Take 1 tablet (2,000 Units total) by mouth daily.     Coenzyme Q-10 200 MG CAPS Take 200 mg by mouth daily.     Cyanocobalamin (B-12) 5000 MCG CAPS Take 5,000 mcg by mouth daily.     doxycycline (VIBRA-TABS) 100 MG tablet Take 1 tablet (100 mg total) by mouth 2 (two) times daily. 20 tablet 0   EPINEPHrine (EPI-PEN) 0.3 mg/0.3 mL DEVI Inject 0.3 mg into the muscle once as needed (anaphylaxis).     esomeprazole (NEXIUM) 40 MG capsule Take 40 mg by mouth every other day.      ezetimibe (ZETIA) 10 MG tablet TAKE 1 TABLET BY MOUTH EVERYDAY AT BEDTIME 90 tablet 1   fexofenadine (ALLEGRA) 180 MG tablet Take 180 mg by mouth as needed.      FLUoxetine (PROZAC) 10 MG tablet Take 20 mg by mouth daily.     fluticasone (FLONASE) 50 MCG/ACT nasal spray USE 2 SPRAYS INTO  THE NOSE EVERY 12 HOURS AS NEEDED FOR STUFFY NOSE 48 mL 2   Fluticasone-Salmeterol (ADVAIR) 100-50 MCG/DOSE AEPB Inhale 1 puff into the lungs 2 (two) times daily. 60 each 5   furosemide (LASIX) 20 MG tablet TAKE 1 TABLET (20 MG TOTAL) BY MOUTH DAILY AS NEEDED FOR FLUID. 90 tablet 1   glucose blood (ONETOUCH VERIO) test strip Use as instructed to check sugar 2-3x time daily 200 each 5   GLUTATHIONE PO Take 5 drops by mouth in the morning and at bedtime.     Iron, Ferrous Sulfate, 325 (65 Fe) MG TABS Take 325 mg by mouth every other day. 90 tablet 3   metFORMIN (GLUCOPHAGE) 500 MG tablet Take 1 tablet (500 mg total) by mouth daily with supper. 90 tablet 3   Multiple Vitamins-Minerals (ZINC PO) Take by mouth.     nitroGLYCERIN (NITROSTAT) 0.4 MG SL tablet  Place 1 tablet (0.4 mg total) under the tongue every 5 (five) minutes as needed for chest pain. 25 tablet 3   ONETOUCH DELICA LANCETS FINE MISC USE TO CHECK SUGAR 1 TIME DAILY 100 each 5   ONETOUCH VERIO test strip USE AS INSTRUCTED TO CHECK SUGAR 1 TIME DAILY 100 strip 8   potassium chloride (K-DUR) 10 MEQ tablet Take 1 tablet (10 mEq total) by mouth daily as needed (Takes with furosemide doses.). 90 tablet 1   predniSONE (DELTASONE) 20 MG tablet 2 tabs po for 4 days, then 1 tab po for 4 days 12 tablet 0   rosuvastatin (CRESTOR) 10 MG tablet TAKE 1 TABLET (10 MG TOTAL) BY MOUTH EVERY OTHER DAY. 45 tablet 2   Spacer/Aero Chamber Mouthpiece MISC Use with  inhaler as needed.  J44.9 1 each 0   SYNTHROID 150 MCG tablet TAKE 1 TABLET BY MOUTH DAILY BEFORE BREAKFAST. 90 tablet 2   valsartan (DIOVAN) 160 MG tablet TAKE ONE HALF TABLET (80 MG TOTAL) 2 TIMES DAILY. 90 tablet 1   VITAMIN A PO Take 600 mcg by mouth.     VITAMIN D, CHOLECALCIFEROL, PO Take 15 mg by mouth.     No current facility-administered medications for this visit.     Allergies:   Antihistamines, diphenhydramine-type; Sulfonamide derivatives; Incruse ellipta [umeclidinium bromide];  Clarithromycin; Codeine; Lyrica [pregabalin]; and Spiriva handihaler [tiotropium bromide monohydrate]   Social History:  The patient  reports that she quit smoking about 15 years ago. Her smoking use included cigarettes. She has a 100.00 pack-year smoking history. She has never used smokeless tobacco. She reports current alcohol use. She reports that she does not use drugs.   Family History:   family history includes Cystic fibrosis in her sister; Emphysema in her father; Heart disease in her mother; Hyperthyroidism in her brother and sister; Osteoarthritis in her brother; Thyroid disease in her mother.    Review of Systems: Review of Systems  Constitutional: Negative.   HENT: Negative.    Respiratory:  Positive for shortness of breath.   Cardiovascular: Negative.   Gastrointestinal: Negative.   Musculoskeletal: Negative.        Leg weakness  Neurological: Negative.   Psychiatric/Behavioral: Negative.    All other systems reviewed and are negative.  PHYSICAL EXAM: VS:  LMP  (LMP Unknown)  , BMI There is no height or weight on file to calculate BMI. Constitutional:  oriented to person, place, and time. No distress.  HENT:  Head: Grossly normal Eyes:  no discharge. No scleral icterus.  Neck: No JVD, no carotid bruits  Cardiovascular: Regular rate and rhythm, no murmurs appreciated Pulmonary/Chest: Clear to auscultation bilaterally, no wheezes or rails Abdominal: Soft.  no distension.  no tenderness.  Musculoskeletal: Normal range of motion Neurological:  normal muscle tone. Coordination normal. No atrophy Skin: Skin warm and dry Psychiatric: Anxious   Recent Labs: 09/12/2020: ALT 8; BUN 29; Creatinine, Ser 0.98; Potassium 4.3; Sodium 143 11/21/2020: Hemoglobin 8.4 Repeated and verified X2.; Platelets 227.0; TSH 0.73    Lipid Panel Lab Results  Component Value Date   CHOL 148 06/26/2019   HDL 60.00 06/26/2019   LDLCALC 69 06/26/2019   TRIG 93.0 06/26/2019      Wt Readings  from Last 3 Encounters:  10/28/20 211 lb 12.8 oz (96.1 kg)  10/13/20 212 lb (96.2 kg)  09/12/20 217 lb (98.4 kg)       ASSESSMENT AND PLAN:   Chronic diastolic CHF (congestive heart failure) ([place])  Sleeping with legs down a little bit, takes Lasix as needed not on a regular basis Recommend she closely monitor leg edema, abdominal distention and need for more Lasix She prefers to take it as needed  Essential hypertension - Plan: EKG 12-Lead Blood pressure is well controlled on today's visit. No changes made to the medications.  Anxiety Major issue she reports Everything causing anxiety to her, working with psychiatry, slow increase in BuSpar, she reports it is not helping very much Not her usual self today, spacing out, staring off at the door  Lower extremity swelling  Recommend leg elevation, compression hose, more liberal use of her Lasix  COPD exacerbation (HCC) On chronic oxygen , inhalers/Symbicort Very debilitated, followed by pulmonary  Denies any recent exacerbations of her COPD Appears at her baseline  Shortness of breath Multifactorial with underlying lung disease/COPD, morbid obesity, deconditioning, chronic diastolic CHF On 2 L [place] , stable Rarely up to 3-4 sparingly Minimally active  Type 2 diabetes mellitus with diabetic neuropathy, without long-term current use of insulin (HCC) Weight down slightly from last year Reports certain foods are upsetting her  stomach  Morbid obesity ([place]) Weight down 8 pounds   Total encounter time more than 25 minutes  Greater than 50% was spent in counseling and coordination of care with the patient   No orders of the defined types were placed in this encounter.    Signed, Esmond Plants, M.D., Ph.D. 11/30/2020  [place], [place]

## 2020-12-02 ENCOUNTER — Telehealth: Payer: Self-pay | Admitting: Cardiovascular Disease

## 2020-12-02 ENCOUNTER — Ambulatory Visit: Payer: Medicare Other | Admitting: Cardiovascular Disease

## 2020-12-02 DIAGNOSIS — E782 Mixed hyperlipidemia: Secondary | ICD-10-CM

## 2020-12-02 DIAGNOSIS — I10 Essential (primary) hypertension: Secondary | ICD-10-CM

## 2020-12-02 DIAGNOSIS — I5032 Chronic diastolic (congestive) heart failure: Secondary | ICD-10-CM

## 2020-12-02 NOTE — Telephone Encounter (Signed)
STAT if patient feels like he/she is going to faint   Are you dizzy now? yes  Do you feel faint or have you passed out? Patient contributing to low iron results   Do you have any other symptoms? Denies any other symptoms such as elevated bp headache blurry vision   Have you checked your HR and BP (record if available)? Normal per patient

## 2020-12-02 NOTE — Telephone Encounter (Signed)
Anderson [place], spoke with RN Pt cancel appt today d/t dizziness PCP reports labs showed anemia that causing her dizziness Will call to reschedule once feeling better as she does not want to drive d/t dizziness

## 2020-12-03 ENCOUNTER — Telehealth: Payer: Self-pay | Admitting: *Deleted

## 2020-12-03 NOTE — Telephone Encounter (Signed)
Called patient's daughterAdah Salvage to inform of stat labs on 01-15-21 @ 11:15 am @ [facility] and her CT to follow on 01-15-21- arrival time- 12:15 pm @ [facility], pt. to have water only - 4 hrs. prior to test, spoke with patient's daughter and she is aware of these appts.

## 2020-12-04 ENCOUNTER — Ambulatory Visit (INDEPENDENT_AMBULATORY_CARE_PROVIDER_SITE_OTHER): Payer: BC Managed Care – PPO | Admitting: Family Medicine

## 2020-12-04 ENCOUNTER — Encounter: Payer: Self-pay | Admitting: Family Medicine

## 2020-12-04 ENCOUNTER — Other Ambulatory Visit: Payer: Self-pay

## 2020-12-04 VITALS — BP 136/68 | HR 61 | Temp 97.4°F | Ht 65.0 in | Wt 216.0 lb

## 2020-12-04 DIAGNOSIS — R399 Unspecified symptoms and signs involving the genitourinary system: Secondary | ICD-10-CM

## 2020-12-04 LAB — POCT URINALYSIS DIP (MANUAL ENTRY)
Bilirubin, UA: NEGATIVE
Glucose, UA: NEGATIVE mg/dL
Ketones, POC UA: NEGATIVE mg/dL
Leukocytes, UA: NEGATIVE
Nitrite, UA: NEGATIVE
Spec Grav, UA: 1.015 (ref 1.010–1.025)
Urobilinogen, UA: 1 E.U./dL
pH, UA: 6.5 (ref 5.0–8.0)

## 2020-12-04 NOTE — Progress Notes (Signed)
   Subjective:     Alexandria [name] is a 85 y.o. female presenting for Urinary Tract Infection     HPI  #Dysuria - with urination  - frequency - urgency - no blood in the urine - no abdominal pain, n/v - treatment: drinking water - Symptoms started on Sunday - no vaginal discharge or irritation  On lasix - but has not taken this week    Review of Systems   Social History   Tobacco Use  Smoking Status Former   Packs/day: 2.00   Years: 50.00   Pack years: 100.00   Types: Cigarettes   Quit date: 02/05/2005   Years since quitting: 15.8  Smokeless Tobacco Never        Objective:    BP Readings from Last 3 Encounters:  12/04/20 136/68  10/28/20 (!) 118/58  10/13/20 126/72   Wt Readings from Last 3 Encounters:  12/04/20 216 lb (98 kg)  10/28/20 211 lb 12.8 oz (96.1 kg)  10/13/20 212 lb (96.2 kg)    BP 136/68   Pulse 61   Temp (!) 97.4 F (36.3 C) (Temporal)   Ht 5\' 5"  (1.651 m)   Wt 216 lb (98 kg)   LMP  (LMP Unknown)   SpO2 100% Comment: PATIENT ON O2  BMI 35.94 kg/m    Physical Exam Constitutional:      General: She is not in acute distress.    Appearance: She is well-developed. She is not diaphoretic.  HENT:     Right Ear: External ear normal.     Left Ear: External ear normal.  Eyes:     Conjunctiva/sclera: Conjunctivae normal.  Cardiovascular:     Rate and Rhythm: Normal rate and regular rhythm.  Pulmonary:     Effort: Pulmonary effort is normal.     Breath sounds: Rhonchi present. No wheezing.     Comments: On O2 Abdominal:     General: Bowel sounds are normal. There is no distension.     Palpations: Abdomen is soft.     Tenderness: There is no abdominal tenderness. There is no right CVA tenderness or left CVA tenderness.  Musculoskeletal:     Cervical back: Neck supple.  Skin:    General: Skin is warm and dry.     Capillary Refill: Capillary refill takes less than 2 seconds.  Neurological:     Mental Status: She is alert. Mental  status is at baseline.  Psychiatric:        Mood and Affect: Mood normal.        Behavior: Behavior normal.     UA: trace protein and blood, no LE or nitrites      Assessment & Plan:   Problem List Items Addressed This Visit   None Visit Diagnoses     UTI symptoms    -  Primary   Relevant Orders   POCT urinalysis dipstick (Completed)   Urine Culture      UA w/o signs of infection Discussed symptomatic care and avoiding irritants Will send for culture given symptoms and treat if positive.   Return if symptoms worsen or fail to improve.  Lesleigh Noe, MD  This visit occurred during the SARS-CoV-2 public health emergency.  Safety protocols were in place, including screening questions prior to the visit, additional usage of staff PPE, and extensive cleaning of exam room while observing appropriate contact time as indicated for disinfecting solutions.

## 2020-12-04 NOTE — Patient Instructions (Signed)
Urinary symptoms - Continue water - can try Cranberry or Azo  - Consider limiting: coffee, alcohol, carbonated beverages, and spicy foods or citrus

## 2020-12-05 LAB — URINE CULTURE
MICRO NUMBER:: 12440115
SPECIMEN QUALITY:: ADEQUATE

## 2020-12-08 ENCOUNTER — Encounter: Payer: Self-pay | Admitting: Radiation Oncology

## 2020-12-08 ENCOUNTER — Telehealth: Payer: Self-pay | Admitting: *Deleted

## 2020-12-08 NOTE — Telephone Encounter (Signed)
CALLED PATIENT'S DAUGHTER- [name] TO INFORM THAT ALISON PERKINS WOULD BE ABLE TO CALL HER ON 01-20-21 @ 3:30 PM, LVM FOR A RETURN CALL

## 2020-12-09 ENCOUNTER — Other Ambulatory Visit: Payer: Self-pay

## 2020-12-09 ENCOUNTER — Encounter: Payer: Self-pay | Admitting: Cardiovascular Disease

## 2020-12-09 ENCOUNTER — Ambulatory Visit (INDEPENDENT_AMBULATORY_CARE_PROVIDER_SITE_OTHER): Payer: Medicare Other | Admitting: Cardiovascular Disease

## 2020-12-09 VITALS — BP 98/62 | HR 84 | Ht 65.0 in | Wt 213.2 lb

## 2020-12-09 DIAGNOSIS — I5032 Chronic diastolic (congestive) heart failure: Secondary | ICD-10-CM | POA: Diagnosis not present

## 2020-12-09 DIAGNOSIS — Z6835 Body mass index (BMI) 35.0-35.9, adult: Secondary | ICD-10-CM | POA: Diagnosis not present

## 2020-12-09 DIAGNOSIS — I959 Hypotension, unspecified: Secondary | ICD-10-CM

## 2020-12-09 DIAGNOSIS — R079 Chest pain, unspecified: Secondary | ICD-10-CM | POA: Diagnosis not present

## 2020-12-09 DIAGNOSIS — E782 Mixed hyperlipidemia: Secondary | ICD-10-CM | POA: Diagnosis not present

## 2020-12-09 DIAGNOSIS — I10 Essential (primary) hypertension: Secondary | ICD-10-CM | POA: Diagnosis not present

## 2020-12-09 DIAGNOSIS — E6609 Other obesity due to excess calories: Secondary | ICD-10-CM

## 2020-12-09 DIAGNOSIS — R0602 Shortness of breath: Secondary | ICD-10-CM | POA: Diagnosis not present

## 2020-12-09 NOTE — Patient Instructions (Addendum)
Medication Instructions:   Please STOP Valsartan  Monitor blood pressures, bring log with you at your next appointment   If you need a refill on your cardiac medications before your next appointment, please call your pharmacy.   Lab work: No new labs needed  Testing/Procedures: Your physician has requested that you have an echocardiogram. Echocardiography is a painless test that uses sound waves to create images of your heart. It provides your doctor with information about the size and shape of your heart and how well your heart's chambers and valves are working. This procedure takes approximately one hour. There are no restrictions for this procedure.  There is a possibility that an IV may need to be started during your test to inject an image enhancing agent. This is done to obtain more optimal pictures of your heart. Therefore we ask that you do at least drink some water prior to coming in to hydrate your veins.   Follow-Up: At [facility], you and your health needs are our priority.  As part of our continuing mission to provide you with exceptional heart care, we have created designated Provider Care Teams.  These Care Teams include your primary Cardiologist (physician) and Advanced Practice Providers (APPs -  Physician Assistants and Nurse Practitioners) who all work together to provide you with the care you need, when you need it.  You will need a follow up appointment follow up after ECHO, APP is fine  Providers on your designated Care Team:   Murray Hodgkins, NP Christell Faith, PA-C Marrianne Mood, PA-C Cadence Wentworth, [place]  COVID-19 Vaccine Information can be found at: [email or web address].uk For questions related to vaccine distribution or appointments, please email [email or web address] or call [phone].

## 2020-12-09 NOTE — Progress Notes (Signed)
Cardiology Office Note  Date:  12/09/2020   ID:  Alexandria [name], DOB 04/22/1935, MRN [id number]  PCP:  Tonia Ghent, MD   Chief Complaint  Patient presents with   Follow-up    Patient c/o chest discomfort with tightness . Not sure if it is coming from walking from car to exam room. Patient also with bilateral legs edema but no change in symptoms.     HPI:  85 year old woman with  obesity,   long history of smoking for 50 years, COPD, quit 15 years DM2, dx in 2006, non-insulin-dependent, controlled, with  mild CKD hyperlipidemia,  hypertension,   2012  chest pain, stress test at that time with no ischemia, normal EF  July 2016 with COPD exacerbation, sepsis. sleep apnea, not on CPAP, seen by Dr. Llana Aliment, mask does not fit Chronic SOB, normal echo 09/2014 Anxiety Diagnosis of lung cancer,  completed radiation , planned chemo She presents for follow-up of her shortness of breath symptoms  In follow-up today, her daughter is on the phone She reports blood pressures running very low at home Having orthostasis symptoms 70 to 90 systolic  Taking valsartan 80 BID, On lasix sparingly for leg swelling, took Lasix twice this week but has not taken much in the past several weeks  Has chronic lower extremity edema, does not wear compression hose On oxygen, walks with a walker  Sleeping in recliner in living room Arthritis knees and feet  Last echocardiogram 6 years ago  Labs reviewed Total chol 148 LDL 69  EKG personally reviewed by myself on todays visit Shows normal sinus rhythm rate 84 bpm T wave change I and AVL  Other PMH reviewed CT scan chest 06/2019: Subsolid 2.2 cm posterior apical right upper lobe pulmonary nodule with 1.0 cm solid component, mildly increased since 12/14/2018 CT, slowly increasing on multiple CT studies back to 2017. Findings are most compatible with a slow growing primary bronchogenic carcinoma.  PET scan Increased metabolic activity in the  area of the part solid nodule in the RIGHT upper lobe. Findings are suspicious for indolent bronchogenic neoplasm.  Did XRT Declined chemo  Previous Escherichia coli sepsis,  At that time had anemia with hematocrit down to 25, iron deficiency anemia Treated for COPD exacerbation CT scan of the abdomen showed mild descending aorta atherosclerosis   taking Crestor and zetia.   PMH:   has a past medical history of Allergy, unspecified not elsewhere classified, Anxiety, Cataract, Chronic diastolic congestive heart failure ([place]), COPD (chronic obstructive pulmonary disease) ([place]) (03/08/1998), Depression (12/07/1998), Diabetes mellitus type II (02/05/2006), Disorders of bursae and tendons in shoulder region, unspecified, E. coli bacteremia, Esophagitis, GERD (gastroesophageal reflux disease), History of UTI, Hyperlipidemia (10/04/2000), Hypertension (03/08/1992), Hypothyroidism (03/08/1968), Iron deficiency anemia, Lung nodule, Malignant neoplasm of right upper lobe of lung ([place]) (07/24/2019), Obesity, OSA (obstructive sleep apnea), Peripheral neuropathy, and Ventral hernia.  PSH:    Past Surgical History:  Procedure Laterality Date   ABD U/S  03/19/1999   Nml x2 foci in liver   ADENOSINE MYOVIEW  06/02/2007   Nml   CARDIOLITE PERSANTINE  08/24/2000   Nml   CAROTID U/S  08/24/2000   1-39% ICA stenosis   CAROTID U/S  06/02/2007   No apprec change    CARPAL TUNNEL RELEASE  12/1997   Right   CESAREAN SECTION     x2 [place]/ repeat   CHOLECYSTECTOMY  1997   COLONOSCOPY WITH PROPOFOL N/A 02/12/2016   Procedure: COLONOSCOPY WITH PROPOFOL;  Surgeon:  Milus Banister, MD;  Location: [facility];  Service: Endoscopy;  Laterality: N/A;   CT ABD W & PELVIS WO/W CM     Abd hemangiomas of liver, 1 cm R renal cyst   DENTAL SURGERY  2016   Implants   DEXA  07/03/2003   Nml   ESOPHAGOGASTRODUODENOSCOPY  12/05/1997   Nml (due to hoarseness)   ESOPHAGOGASTRODUODENOSCOPY (EGD) WITH PROPOFOL N/A  02/12/2016   Procedure: ESOPHAGOGASTRODUODENOSCOPY (EGD) WITH PROPOFOL;  Surgeon: Milus Banister, MD;  Location: [facility];  Service: Endoscopy;  Laterality: N/A;   GALLBLADDER SURGERY     HERNIA REPAIR  01/24/2009   Lap Ventr w/ Lysis of adhesions (Dr. Donne Hazel)   knee arthroscopic surgery  years ago   right   ROTATOR CUFF REPAIR  1984   Right, [place]   SHOULDER OPEN ROTATOR CUFF REPAIR  02/08/2012   Procedure: ROTATOR CUFF REPAIR SHOULDER OPEN;  Surgeon: Magnus Sinning, MD;  Location: [facility];  Service: Orthopedics;  Laterality: Left;  Left Shoulder Open Anterior Acrominectomy Rotator Cuff Repair Open Distal Clavicle Resection ,tissue mend graft, and repair of biceps tendon   THUMB RELEASE  12/1997   Right   TONSILLECTOMY     TOTAL ABDOMINAL HYSTERECTOMY  1985   Due to dysmennorhea   US ECHOCARDIOGRAPHY  06/02/2007    Current Outpatient Medications  Medication Sig Dispense Refill   albuterol (PROAIR HFA) 108 (90 Base) MCG/ACT inhaler Inhale 2 puffs into the lungs every 6 (six) hours as needed for wheezing or shortness of breath. INHALE 2 PUFFS INTO THE LUNGS EVERY 6 HOURS AS NEEDED FOR WHEEZING OR SHORTNESS OF BREATH 3 Inhaler 3   busPIRone (BUSPAR) 15 MG tablet Take 15 mg by mouth 3 (three) times daily.     cariprazine (VRAYLAR) 1.5 MG capsule Take 1 capsule (1.5 mg total) by mouth daily. Tapering dose from 3mg  to 1.5mg  then stopping after 2.5 weeks. 17 capsule 0   Cholecalciferol (VITAMIN D) 50 MCG (2000 UT) tablet Take 1 tablet (2,000 Units total) by mouth daily.     Coenzyme Q-10 200 MG CAPS Take 200 mg by mouth daily.     Cyanocobalamin (B-12) 5000 MCG CAPS Take 5,000 mcg by mouth daily.     doxycycline (VIBRA-TABS) 100 MG tablet Take 1 tablet (100 mg total) by mouth 2 (two) times daily. 20 tablet 0   EPINEPHrine (EPI-PEN) 0.3 mg/0.3 mL DEVI Inject 0.3 mg into the muscle once as needed (anaphylaxis).     esomeprazole (NEXIUM) 40 MG capsule Take 40 mg by mouth every other  day.      ezetimibe (ZETIA) 10 MG tablet TAKE 1 TABLET BY MOUTH EVERYDAY AT BEDTIME 90 tablet 1   fexofenadine (ALLEGRA) 180 MG tablet Take 180 mg by mouth as needed.      FLUoxetine (PROZAC) 10 MG tablet Take 20 mg by mouth daily.     fluticasone (FLONASE) 50 MCG/ACT nasal spray USE 2 SPRAYS INTO THE NOSE EVERY 12 HOURS AS NEEDED FOR STUFFY NOSE 48 mL 2   Fluticasone-Salmeterol (ADVAIR) 100-50 MCG/DOSE AEPB Inhale 1 puff into the lungs 2 (two) times daily. 60 each 5   furosemide (LASIX) 20 MG tablet TAKE 1 TABLET (20 MG TOTAL) BY MOUTH DAILY AS NEEDED FOR FLUID. 90 tablet 1   glucose blood (ONETOUCH VERIO) test strip Use as instructed to check sugar 2-3x time daily 200 each 5   GLUTATHIONE PO Take 5 drops by mouth in the morning and at bedtime.  Iron, Ferrous Sulfate, 325 (65 Fe) MG TABS Take 325 mg by mouth every other day. 90 tablet 3   metFORMIN (GLUCOPHAGE) 500 MG tablet Take 1 tablet (500 mg total) by mouth daily with supper. 90 tablet 3   Multiple Vitamins-Minerals (ZINC PO) Take by mouth.     nitroGLYCERIN (NITROSTAT) 0.4 MG SL tablet Place 1 tablet (0.4 mg total) under the tongue every 5 (five) minutes as needed for chest pain. 25 tablet 3   ONETOUCH DELICA LANCETS FINE MISC USE TO CHECK SUGAR 1 TIME DAILY 100 each 5   ONETOUCH VERIO test strip USE AS INSTRUCTED TO CHECK SUGAR 1 TIME DAILY 100 strip 8   predniSONE (DELTASONE) 20 MG tablet 2 tabs po for 4 days, then 1 tab po for 4 days 12 tablet 0   rosuvastatin (CRESTOR) 10 MG tablet TAKE 1 TABLET (10 MG TOTAL) BY MOUTH EVERY OTHER DAY. 45 tablet 2   Spacer/Aero Chamber Mouthpiece MISC Use with  inhaler as needed.  J44.9 1 each 0   SYNTHROID 150 MCG tablet TAKE 1 TABLET BY MOUTH DAILY BEFORE BREAKFAST. 90 tablet 2   valsartan (DIOVAN) 160 MG tablet TAKE ONE HALF TABLET (80 MG TOTAL) 2 TIMES DAILY. 90 tablet 1   VITAMIN A PO Take 600 mcg by mouth.     VITAMIN D, CHOLECALCIFEROL, PO Take 15 mg by mouth.     potassium chloride (K-DUR)  10 MEQ tablet Take 1 tablet (10 mEq total) by mouth daily as needed (Takes with furosemide doses.). 90 tablet 1   No current facility-administered medications for this visit.     Allergies:   Antihistamines, diphenhydramine-type; Sulfonamide derivatives; Incruse ellipta [umeclidinium bromide]; Clarithromycin; Codeine; Lyrica [pregabalin]; and Spiriva handihaler [tiotropium bromide monohydrate]   Social History:  The patient  reports that she quit smoking about 15 years ago. Her smoking use included cigarettes. She has a 100.00 pack-year smoking history. She has never used smokeless tobacco. She reports current alcohol use. She reports that she does not use drugs.   Family History:   family history includes Cystic fibrosis in her sister; Emphysema in her father; Heart disease in her mother; Hyperthyroidism in her brother and sister; Osteoarthritis in her brother; Thyroid disease in her mother.    Review of Systems: Review of Systems  Constitutional: Negative.   HENT: Negative.    Respiratory:  Positive for shortness of breath.   Cardiovascular: Negative.   Gastrointestinal: Negative.   Musculoskeletal: Negative.        Leg weakness  Neurological: Negative.   Psychiatric/Behavioral: Negative.    All other systems reviewed and are negative.  PHYSICAL EXAM: VS:  BP 98/62   Pulse 84   Ht 5\' 5"  (1.651 m)   Wt 213 lb 3.2 oz (96.7 kg)   LMP  (LMP Unknown)   SpO2 98%   BMI 35.48 kg/m  , BMI Body mass index is 35.48 kg/m. Constitutional:  oriented to person, place, and time. No distress.  On oxygen HENT:  Head: Grossly normal Eyes:  no discharge. No scleral icterus.  Neck: No JVD, no carotid bruits  Cardiovascular: Regular rate and rhythm, no murmurs appreciated Trace to 1+ lower extremity edema bilaterally Pulmonary/Chest: Clear to auscultation bilaterally, no wheezes or rails Abdominal: Soft.  no distension.  no tenderness.  Musculoskeletal: Normal range of motion Neurological:   normal muscle tone. Coordination normal. No atrophy Skin: Skin warm and dry Psychiatric: normal affect, pleasant   Recent Labs: 09/12/2020: ALT 8; BUN 29; Creatinine,  Ser 0.98; Potassium 4.3; Sodium 143 11/21/2020: Hemoglobin 8.4 Repeated and verified X2.; Platelets 227.0; TSH 0.73    Lipid Panel Lab Results  Component Value Date   CHOL 148 06/26/2019   HDL 60.00 06/26/2019   LDLCALC 69 06/26/2019   TRIG 93.0 06/26/2019      Wt Readings from Last 3 Encounters:  12/09/20 213 lb 3.2 oz (96.7 kg)  12/04/20 216 lb (98 kg)  10/28/20 211 lb 12.8 oz (96.1 kg)     ASSESSMENT AND PLAN:   Chronic diastolic CHF (congestive heart failure) (HCC)  Lower extremity edema stable, chronic shortness of breath from COPD on oxygen She prefers to take it as needed Probably better to do this sparingly in the setting of low blood pressure  Orthostasis/hypotension Etiology unclear We will stop the valsartan for now Daughter on the phone reports systolic pressures commonly in the 80 range Echocardiogram ordered Denies any symptoms concerning for urinary tract infection or other etiology  Anxiety Has been a major issue in the past working with psychiatry, on BuSpar  Lower extremity swelling  Recommend leg elevation, compression hose, Lasix as needed  Likely component of dependent edema  LasixCOPD exacerbation (HCC) On chronic oxygen , inhalers/Symbicort Very debilitated, followed by pulmonary  No recent COPD exacerbation  Shortness of breath Multifactorial with underlying lung disease/COPD, morbid obesity, deconditioning, chronic diastolic CHF On 2 L [place] , stable Rarely up to 3-4 sparingly Minimally active  Type 2 diabetes mellitus with diabetic neuropathy, without long-term current use of insulin ([place]) Followed by primary care  Morbid obesity ([place]) Immobile   Total encounter time more than 25 minutes  Greater than 50% was spent in counseling and coordination of care with the  patient   Orders Placed This Encounter  Procedures   EKG 12-Lead      Signed, Esmond Plants, M.D., Ph.D. 12/09/2020  [facility] [place], [place] [phone]

## 2020-12-10 ENCOUNTER — Telehealth: Payer: Self-pay | Admitting: Family Medicine

## 2020-12-10 NOTE — Telephone Encounter (Signed)
Pt states she recently had a stairlift put in and she is needing RX in order for her to receive a refund. Pt emailed over a form stating what is needed. Will send paperwork to [place]. Pt states she only has 15 days to have this completed from date of install in order for her to receive refund. She was not aware of this until this week. States she needs it completed and turned in by Friday, 10/7. Please advise on if this can be done.

## 2020-12-11 NOTE — Telephone Encounter (Signed)
Pt called wants to know the status . Would like a call back # [phone]

## 2020-12-11 NOTE — Telephone Encounter (Signed)
Hold the paperwork and talk to me about this tomorrow AM.  I can't guarantee anything here, esp w/o seeing the paperwork.  If she needs an OV for the exam, then that complicates the issue.

## 2020-12-11 NOTE — Telephone Encounter (Signed)
Patient states she had the stairlift installed on 12/01/20. She was not aware that she only had 15 days to get the rx to the company but looking at the form that she sent to [place] she signed the agreement that states this. I called and advised patient that Dr. [name] is not in the office today and will try to see if this is something we can do once he is back or if okay can try to do today. Order is due tomorrow. Are you okay with order being done for this and will fax once done?

## 2020-12-12 NOTE — Telephone Encounter (Signed)
Form done.  Please send.  Thanks.

## 2020-12-15 NOTE — Telephone Encounter (Signed)
Late documentation. Form was faxed to [employer] Friday afternoon.

## 2020-12-16 ENCOUNTER — Encounter: Payer: Self-pay | Admitting: Internal Medicine

## 2020-12-16 ENCOUNTER — Inpatient Hospital Stay: Payer: Medicare Other | Attending: Internal Medicine | Admitting: Internal Medicine

## 2020-12-16 ENCOUNTER — Inpatient Hospital Stay: Payer: Medicare Other

## 2020-12-16 ENCOUNTER — Other Ambulatory Visit: Payer: Self-pay

## 2020-12-16 VITALS — BP 120/54 | HR 87 | Temp 98.6°F | Resp 20 | Wt 217.5 lb

## 2020-12-16 DIAGNOSIS — D649 Anemia, unspecified: Secondary | ICD-10-CM

## 2020-12-16 DIAGNOSIS — Z87891 Personal history of nicotine dependence: Secondary | ICD-10-CM | POA: Insufficient documentation

## 2020-12-16 LAB — TYPE AND SCREEN
ABO/RH(D): O NEG
Antibody Screen: POSITIVE

## 2020-12-16 LAB — TECHNOLOGIST SMEAR REVIEW: Plt Morphology: ADEQUATE

## 2020-12-16 LAB — CBC WITH DIFFERENTIAL/PLATELET
Abs Immature Granulocytes: 0.02 10*3/uL (ref 0.00–0.07)
Basophils Absolute: 0 10*3/uL (ref 0.0–0.1)
Basophils Relative: 0 %
Eosinophils Absolute: 0.1 10*3/uL (ref 0.0–0.5)
Eosinophils Relative: 3 %
HCT: 27.6 % — ABNORMAL LOW (ref 36.0–46.0)
Hemoglobin: 8.2 g/dL — ABNORMAL LOW (ref 12.0–15.0)
Immature Granulocytes: 0 %
Lymphocytes Relative: 20 %
Lymphs Abs: 0.9 10*3/uL (ref 0.7–4.0)
MCH: 29.6 pg (ref 26.0–34.0)
MCHC: 29.7 g/dL — ABNORMAL LOW (ref 30.0–36.0)
MCV: 99.6 fL (ref 80.0–100.0)
Monocytes Absolute: 0.5 10*3/uL (ref 0.1–1.0)
Monocytes Relative: 10 %
Neutro Abs: 3.1 10*3/uL (ref 1.7–7.7)
Neutrophils Relative %: 67 %
Platelets: 200 10*3/uL (ref 150–400)
RBC: 2.77 MIL/uL — ABNORMAL LOW (ref 3.87–5.11)
RDW: 13.3 % (ref 11.5–15.5)
WBC: 4.6 10*3/uL (ref 4.0–10.5)
nRBC: 0 % (ref 0.0–0.2)

## 2020-12-16 LAB — RETICULOCYTES
Immature Retic Fract: 27.3 % — ABNORMAL HIGH (ref 2.3–15.9)
RBC.: 2.78 MIL/uL — ABNORMAL LOW (ref 3.87–5.11)
Retic Count, Absolute: 123.7 10*3/uL (ref 19.0–186.0)
Retic Ct Pct: 4.5 % — ABNORMAL HIGH (ref 0.4–3.1)

## 2020-12-16 LAB — COMPREHENSIVE METABOLIC PANEL
ALT: 12 U/L (ref 0–44)
AST: 14 U/L — ABNORMAL LOW (ref 15–41)
Albumin: 3.4 g/dL — ABNORMAL LOW (ref 3.5–5.0)
Alkaline Phosphatase: 74 U/L (ref 38–126)
Anion gap: 7 (ref 5–15)
BUN: 24 mg/dL — ABNORMAL HIGH (ref 8–23)
CO2: 33 mmol/L — ABNORMAL HIGH (ref 22–32)
Calcium: 8.9 mg/dL (ref 8.9–10.3)
Chloride: 100 mmol/L (ref 98–111)
Creatinine, Ser: 0.74 mg/dL (ref 0.44–1.00)
GFR, Estimated: >60 mL/min (ref >60)
Glucose, Bld: 122 mg/dL — ABNORMAL HIGH (ref 70–99)
Potassium: 4.5 mmol/L (ref 3.5–5.1)
Sodium: 140 mmol/L (ref 135–145)
Total Bilirubin: 0.2 mg/dL — ABNORMAL LOW (ref 0.3–1.2)
Total Protein: 6.5 g/dL (ref 6.5–8.1)

## 2020-12-16 LAB — IRON AND TIBC
Iron: 27 ug/dL — ABNORMAL LOW (ref 28–170)
Saturation Ratios: 7 % — ABNORMAL LOW (ref 10.4–31.8)
TIBC: 409 ug/dL (ref 250–450)
UIBC: 382 ug/dL

## 2020-12-16 LAB — FERRITIN: Ferritin: 4 ng/mL — ABNORMAL LOW (ref 11–307)

## 2020-12-16 LAB — LACTATE DEHYDROGENASE: LDH: 115 U/L (ref 98–192)

## 2020-12-16 LAB — ABO/RH: ABO/RH(D): O NEG

## 2020-12-16 NOTE — Progress Notes (Signed)
[facility] NOTE  Patient Care Team: Tonia Ghent, MD as PCP - General Rockey Situ, Kathlene November, MD as Consulting Physician (Cardiology)  CHIEF COMPLAINTS/PURPOSE OF CONSULTATION: ANEMIA   HEMATOLOGY HISTORY:  # CHRONIC INTERMITTENT ANEMIA  [since 2010]- Hb SEP 2022- 8; previously on PO Iron- ; WBC/platelets- Normal; MCV- 90s. EGD- > 15 years; /Colonoscopy:4 years- [Dr.Jacob;; in [place]] capsule-? Bone marrow Biopsy-  #   AUG 2021- RIGHT LUNG stage I non-small cell lung cancer-  s/p SBRT   [Dr.Moody; [place]]  # COPD on 2.5 lit/day [Dr.Sood]; diastolic CHF/ OCT 8756-EPP BP [needing to come off losartan- Dr.Gollan]; peripheral neuropathy.  HISTORY OF PRESENTING ILLNESS: Alone; 2.5 Lit/ O2 24 x7. In wheal chair. Daughter, Lavella Lemons  on the phone.   Alexandria [name] 85 y.o.  female has been referred to [place] for further evaluation/work-up for anemia.  Patient complains of significant shortness of breath/fatigue which has progressively gotten worse.  Earlier in the week patient had dizziness-and noted to have low blood pressures.  She has been taken off losartan by cardiology.   Blood in stools: None Change in bowel habits- None Blood in urine: None Difficulty swallowing: None Abnormal weight loss: None Iron supplementation:on PO iron.   Prior Blood transfusions: Never.  Bariatric surgery: None Vaginal bleeding: None  Review of Systems  Constitutional:  Positive for malaise/fatigue. Negative for chills, diaphoresis, fever and weight loss.  HENT:  Negative for nosebleeds and sore throat.   Eyes:  Negative for double vision.  Respiratory:  Positive for cough and shortness of breath. Negative for hemoptysis, sputum production and wheezing.   Cardiovascular:  Negative for chest pain, palpitations, orthopnea and leg swelling.  Gastrointestinal:  Negative for abdominal pain, blood in stool, constipation, diarrhea, heartburn, melena, nausea and vomiting.  Genitourinary:   Negative for dysuria, frequency and urgency.  Musculoskeletal:  Negative for back pain and joint pain.  Skin: Negative.  Negative for itching and rash.  Neurological:  Positive for dizziness. Negative for tingling, focal weakness, weakness and headaches.  Endo/Heme/Allergies:  Does not bruise/bleed easily.  Psychiatric/Behavioral:  Negative for depression. The patient is not nervous/anxious and does not have insomnia.    MEDICAL HISTORY:  Past Medical History:  Diagnosis Date   Allergy, unspecified not elsewhere classified    Anxiety    Cataract    Chronic diastolic congestive heart failure (HCC)    COPD (chronic obstructive pulmonary disease) ([place]) 03/08/1998   PFTs 12/12/2002 FEV 1 1.42 (64%) ratio 58 with no better after B2 and DLCO75%; PFTs 11/20/09 FEV1 1.50 (73%) ratio 50 no better after B2 with DLCO 62%; Hfa 50% 11/20/2009 >75%, 01/13/10 p coaching   Depression 12/07/1998   Diabetes mellitus type II 02/05/2006   Dr. Cruzita Lederer with endo   Disorders of bursae and tendons in shoulder region, unspecified    Rotator cuff syndrome, right   E. coli bacteremia    Esophagitis    GERD (gastroesophageal reflux disease)    History of UTI    Hyperlipidemia 10/04/2000   Hypertension 03/08/1992   Hypothyroidism 03/08/1968   Iron deficiency anemia    Lung nodule    radiation starts 10-08-2019   Malignant neoplasm of right upper lobe of lung ([place]) 07/24/2019   Obesity    NOS   OSA (obstructive sleep apnea)    PSG 01/27/10 AHI 13, pt does not know CPAP settings   Peripheral neuropathy    Likely due to DM per Dr. Murvin Natal hernia  SURGICAL HISTORY: Past Surgical History:  Procedure Laterality Date   ABD U/S  03/19/1999   Nml x2 foci in liver   ADENOSINE MYOVIEW  06/02/2007   Nml   CARDIOLITE PERSANTINE  08/24/2000   Nml   CAROTID U/S  08/24/2000   1-39% ICA stenosis   CAROTID U/S  06/02/2007   No apprec change    CARPAL TUNNEL RELEASE  12/1997   Right   CESAREAN SECTION      x2 [place]/ repeat   CHOLECYSTECTOMY  1997   COLONOSCOPY WITH PROPOFOL N/A 02/12/2016   Procedure: COLONOSCOPY WITH PROPOFOL;  Surgeon: Milus Banister, MD;  Location: [facility];  Service: Endoscopy;  Laterality: N/A;   CT ABD W & PELVIS WO/W CM     Abd hemangiomas of liver, 1 cm R renal cyst   DENTAL SURGERY  2016   Implants   DEXA  07/03/2003   Nml   ESOPHAGOGASTRODUODENOSCOPY  12/05/1997   Nml (due to hoarseness)   ESOPHAGOGASTRODUODENOSCOPY (EGD) WITH PROPOFOL N/A 02/12/2016   Procedure: ESOPHAGOGASTRODUODENOSCOPY (EGD) WITH PROPOFOL;  Surgeon: Milus Banister, MD;  Location: [facility];  Service: Endoscopy;  Laterality: N/A;   GALLBLADDER SURGERY     HERNIA REPAIR  01/24/2009   Lap Ventr w/ Lysis of adhesions (Dr. Donne Hazel)   knee arthroscopic surgery  years ago   right   ROTATOR CUFF REPAIR  1984   Right, [place]   SHOULDER OPEN ROTATOR CUFF REPAIR  02/08/2012   Procedure: ROTATOR CUFF REPAIR SHOULDER OPEN;  Surgeon: Magnus Sinning, MD;  Location: [facility];  Service: Orthopedics;  Laterality: Left;  Left Shoulder Open Anterior Acrominectomy Rotator Cuff Repair Open Distal Clavicle Resection ,tissue mend graft, and repair of biceps tendon   THUMB RELEASE  12/1997   Right   TONSILLECTOMY     TOTAL ABDOMINAL HYSTERECTOMY  1985   Due to dysmennorhea   US ECHOCARDIOGRAPHY  06/02/2007    SOCIAL HISTORY: Social History   Socioeconomic History   Marital status: Married    Spouse name: Not on file   Number of children: 2   Years of education: Not on file   Highest education level: Not on file  Occupational History   Occupation: Retired    Fish farm manager: RETIRED  Tobacco Use   Smoking status: Former    Packs/day: 2.00    Years: 50.00    Pack years: 100.00    Types: Cigarettes    Quit date: 02/05/2005    Years since quitting: 15.8   Smokeless tobacco: Never  Vaping Use   Vaping Use: Never used  Substance and Sexual Activity   Alcohol use: Yes    Comment:  occasionally   Drug use: Never   Sexual activity: Not Currently  Other Topics Concern   Not on file  Social History Narrative   Married with 2 children; Enjoys painting      Quit smoking in 2007; no alcohol; lives in [place] with husband; [name]/d lives in [place]. Had own business. Dont drive- sec to neuropathy.    Social Determinants of Health   Financial Resource Strain: Not on file  Food Insecurity: Not on file  Transportation Needs: Not on file  Physical Activity: Not on file  Stress: Not on file  Social Connections: Not on file  Intimate Partner Violence: Not on file    FAMILY HISTORY: Family History  Problem Relation Age of Onset   Heart disease Mother    Thyroid disease Mother    Emphysema Father  One lung   Cystic fibrosis Sister    Hyperthyroidism Sister    Osteoarthritis Brother    Hyperthyroidism Brother    Esophageal cancer Neg Hx    Cancer Neg Hx        Head or neck   Colon cancer Neg Hx    Stomach cancer Neg Hx    Breast cancer Neg Hx     ALLERGIES:  is allergic to antihistamines, diphenhydramine-type; sulfonamide derivatives; incruse ellipta [umeclidinium bromide]; clarithromycin; codeine; lyrica [pregabalin]; and spiriva handihaler [tiotropium bromide monohydrate].  MEDICATIONS:  Current Outpatient Medications  Medication Sig Dispense Refill   albuterol (PROAIR HFA) 108 (90 Base) MCG/ACT inhaler Inhale 2 puffs into the lungs every 6 (six) hours as needed for wheezing or shortness of breath. INHALE 2 PUFFS INTO THE LUNGS EVERY 6 HOURS AS NEEDED FOR WHEEZING OR SHORTNESS OF BREATH 3 Inhaler 3   busPIRone (BUSPAR) 15 MG tablet Take 15 mg by mouth 3 (three) times daily.     Cholecalciferol (VITAMIN D) 50 MCG (2000 UT) tablet Take 1 tablet (2,000 Units total) by mouth daily.     Coenzyme Q-10 200 MG CAPS Take 200 mg by mouth daily.     Cyanocobalamin (B-12) 5000 MCG CAPS Take 5,000 mcg by mouth daily.     EPINEPHrine (EPI-PEN) 0.3 mg/0.3 mL DEVI  Inject 0.3 mg into the muscle once as needed (anaphylaxis).     esomeprazole (NEXIUM) 40 MG capsule Take 40 mg by mouth every other day.      ezetimibe (ZETIA) 10 MG tablet TAKE 1 TABLET BY MOUTH EVERYDAY AT BEDTIME 90 tablet 1   fexofenadine (ALLEGRA) 180 MG tablet Take 180 mg by mouth as needed.      FLUoxetine (PROZAC) 10 MG tablet Take 20 mg by mouth daily.     fluticasone (FLONASE) 50 MCG/ACT nasal spray USE 2 SPRAYS INTO THE NOSE EVERY 12 HOURS AS NEEDED FOR STUFFY NOSE 48 mL 2   Fluticasone-Salmeterol (ADVAIR) 100-50 MCG/DOSE AEPB Inhale 1 puff into the lungs 2 (two) times daily. 60 each 5   furosemide (LASIX) 20 MG tablet TAKE 1 TABLET (20 MG TOTAL) BY MOUTH DAILY AS NEEDED FOR FLUID. 90 tablet 1   glucose blood (ONETOUCH VERIO) test strip Use as instructed to check sugar 2-3x time daily 200 each 5   GLUTATHIONE PO Take 5 drops by mouth in the morning and at bedtime.     Iron, Ferrous Sulfate, 325 (65 Fe) MG TABS Take 325 mg by mouth every other day. 90 tablet 3   metFORMIN (GLUCOPHAGE) 500 MG tablet Take 1 tablet (500 mg total) by mouth daily with supper. 90 tablet 3   ONETOUCH DELICA LANCETS FINE MISC USE TO CHECK SUGAR 1 TIME DAILY 100 each 5   ONETOUCH VERIO test strip USE AS INSTRUCTED TO CHECK SUGAR 1 TIME DAILY 100 strip 8   rosuvastatin (CRESTOR) 10 MG tablet TAKE 1 TABLET (10 MG TOTAL) BY MOUTH EVERY OTHER DAY. 45 tablet 2   Spacer/Aero Chamber Mouthpiece MISC Use with  inhaler as needed.  J44.9 1 each 0   SYNTHROID 150 MCG tablet TAKE 1 TABLET BY MOUTH DAILY BEFORE BREAKFAST. 90 tablet 2   VITAMIN D, CHOLECALCIFEROL, PO Take 15 mg by mouth.     cariprazine (VRAYLAR) 1.5 MG capsule Take 1 capsule (1.5 mg total) by mouth daily. Tapering dose from 42m to 1.516mthen stopping after 2.5 weeks. (Patient not taking: Reported on 12/16/2020) 17 capsule 0   Multiple Vitamins-Minerals (ZINC  PO) Take by mouth. (Patient not taking: Reported on 12/16/2020)     nitroGLYCERIN (NITROSTAT) 0.4 MG  SL tablet Place 1 tablet (0.4 mg total) under the tongue every 5 (five) minutes as needed for chest pain. (Patient not taking: Reported on 12/16/2020) 25 tablet 3   potassium chloride (K-DUR) 10 MEQ tablet Take 1 tablet (10 mEq total) by mouth daily as needed (Takes with furosemide doses.). (Patient not taking: Reported on 12/16/2020) 90 tablet 1   predniSONE (DELTASONE) 20 MG tablet 2 tabs po for 4 days, then 1 tab po for 4 days (Patient not taking: Reported on 12/16/2020) 12 tablet 0   VITAMIN A PO Take 600 mcg by mouth. (Patient not taking: Reported on 12/16/2020)     No current facility-administered medications for this visit.      PHYSICAL EXAMINATION:   Vitals:   12/16/20 1116  BP: (!) 120/54  Pulse: 87  Resp: 20  Temp: 98.6 F (37 C)  SpO2: 100%   Filed Weights   12/16/20 1116  Weight: 217 lb 8 oz (98.7 kg)    Physical Exam Vitals and nursing note reviewed.  HENT:     Head: Normocephalic and atraumatic.     Mouth/Throat:     Pharynx: Oropharynx is clear.  Eyes:     Extraocular Movements: Extraocular movements intact.     Pupils: Pupils are equal, round, and reactive to light.  Cardiovascular:     Rate and Rhythm: Normal rate and regular rhythm.  Pulmonary:     Comments: Decreased breath sounds bilaterally.  Scattered wheezing. Abdominal:     Palpations: Abdomen is soft.  Musculoskeletal:        General: Normal range of motion.     Cervical back: Normal range of motion.  Skin:    General: Skin is warm.  Neurological:     General: No focal deficit present.     Mental Status: She is alert and oriented to person, place, and time.  Psychiatric:        Behavior: Behavior normal.        Judgment: Judgment normal.    LABORATORY DATA:  I have reviewed the data as listed Lab Results  Component Value Date   WBC 5.6 11/21/2020   HGB 8.4 Repeated and verified X2. (L) 11/21/2020   HCT 26.7 aL (L) 11/21/2020   MCV 97.1 11/21/2020   PLT 227.0 11/21/2020    Recent Labs    01/25/20 1450 07/03/20 1214 09/12/20 0952  NA 140  --  143  K 4.7  --  4.3  CL 102  --  103  CO2 28  --  35*  GLUCOSE 96  --  157*  BUN 22  --  29*  CREATININE 0.86 0.90 0.98  CALCIUM 9.1  --  9.1  PROT 6.7  --  6.7  ALBUMIN  --   --  3.9  AST 11  --  10  ALT 9  --  8  ALKPHOS  --   --  87  BILITOT 0.4  --  0.3     No results found.  Symptomatic anemia  #Anemia symptomatic hemoglobin 8-chronic intermittent [SINCE 2010]; etiology is unclear.  September 2022 [PCP-iron low; no saturations or ferritin available].  No obvious causes noted.  # Long discussion with the patient and daughter regarding multiple etiologies of anemia including iron deficiency/nutritional deficiency etc.  Also discussed possibility of primary bone marrow disorders which would need a bone marrow biopsy to confirm.  However hold off bone marrow biopsy awaiting further work-up at this time.  Also reviewed the possibility of a blood transfusion-if hemoglobin is less than 7-8. Explained the difference between iron infusions versus blood transfusions.  Discussed the reasoning for PRBC transfusion; and also also the potential risk of transfusion including but not limited to risk of infusion reactions; transmission of infections amongst others.  However the risk is extremely small; and the benefits of the infusion overweighs the risk.   Patient agreement to proceed with transfusion if needed.  Plan to hold off PRBC transfusion unless hemoglobin is significantly low less than 7.    # Check CBC CMP LDH  haptoglobin; iron studies/ferritin; myeloma panel kappa lambda light chain; review of peripheral smear; erythropoietin level; reticulocyte count.    # Patient is symptomatic from worsening fatigue-recommend IV iron infusions. Discussed the potential acute infusion reactions with IV iron; which are quite rare.  Patient understands the risk; will proceed with infusions.    # DISPOSITION: # labs-  today; # venofer this week # follow up-1 week- MD; no labs-  possible venofer- Dr.B  Thank you, Dr.Duncan for allowing me to participate in the care of your pleasant patient. Please do not hesitate to contact me with questions or concerns in the interim.  All questions were answered. The patient knows to call the clinic with any problems, questions or concerns.    Cammie Sickle, MD 12/16/2020 12:15 PM

## 2020-12-16 NOTE — Assessment & Plan Note (Addendum)
#  Anemia symptomatic hemoglobin 8-chronic intermittent [SINCE 2010]; etiology is unclear.  September 2022 [PCP-iron low; no saturations or ferritin available].  No obvious causes noted.  # Long discussion with the patient and daughter regarding multiple etiologies of anemia including iron deficiency/nutritional deficiency etc.  Also discussed possibility of primary bone marrow disorders which would need a bone marrow biopsy to confirm.  However hold off bone marrow biopsy awaiting further work-up at this time.  Also reviewed the possibility of a blood transfusion-if hemoglobin is less than 7-8. Explained the difference between iron infusions versus blood transfusions.  Discussed the reasoning for PRBC transfusion; and also also the potential risk of transfusion including but not limited to risk of infusion reactions; transmission of infections amongst others.  However the risk is extremely small; and the benefits of the infusion overweighs the risk.   Patient agreement to proceed with transfusion if needed.  Plan to hold off PRBC transfusion unless hemoglobin is significantly low less than 7.    # Check CBC CMP LDH  haptoglobin; iron studies/ferritin; myeloma panel kappa lambda light chain; review of peripheral smear; erythropoietin level; reticulocyte count.    # Patient is symptomatic from worsening fatigue-recommend IV iron infusions. Discussed the potential acute infusion reactions with IV iron; which are quite rare.  Patient understands the risk; will proceed with infusions.    # DISPOSITION: # labs- today; # venofer this week # follow up-1 week- MD; no labs-  possible venofer- Dr.B  Thank you, Dr.Duncan for allowing me to participate in the care of your pleasant patient. Please do not hesitate to contact me with questions or concerns in the interim.

## 2020-12-17 LAB — HAPTOGLOBIN: Haptoglobin: 159 mg/dL (ref 41–333)

## 2020-12-17 LAB — KAPPA/LAMBDA LIGHT CHAINS
Kappa free light chain: 38 mg/L — ABNORMAL HIGH (ref 3.3–19.4)
Kappa, lambda light chain ratio: 1.79 — ABNORMAL HIGH (ref 0.26–1.65)
Lambda free light chains: 21.2 mg/L (ref 5.7–26.3)

## 2020-12-17 LAB — ERYTHROPOIETIN: Erythropoietin: 90.1 m[IU]/mL — ABNORMAL HIGH (ref 2.6–18.5)

## 2020-12-18 ENCOUNTER — Inpatient Hospital Stay: Payer: Medicare Other

## 2020-12-18 ENCOUNTER — Other Ambulatory Visit: Payer: Self-pay

## 2020-12-18 VITALS — BP 127/31 | HR 71 | Temp 97.0°F | Resp 20

## 2020-12-18 DIAGNOSIS — D649 Anemia, unspecified: Secondary | ICD-10-CM

## 2020-12-18 DIAGNOSIS — Z87891 Personal history of nicotine dependence: Secondary | ICD-10-CM | POA: Diagnosis not present

## 2020-12-18 LAB — IMMUNOFIXATION ELECTROPHORESIS
IgA: 201 mg/dL (ref 64–422)
IgG (Immunoglobin G), Serum: 940 mg/dL (ref 586–1602)
IgM (Immunoglobulin M), Srm: 63 mg/dL (ref 26–217)
Total Protein ELP: 5.9 g/dL — ABNORMAL LOW (ref 6.0–8.5)

## 2020-12-18 MED ORDER — SODIUM CHLORIDE 0.9 % IV SOLN
Freq: Once | INTRAVENOUS | Status: AC
  Filled 2020-12-18: qty 250

## 2020-12-18 MED ORDER — IRON SUCROSE 20 MG/ML IV SOLN
200.0000 mg | Freq: Once | INTRAVENOUS | Status: AC
  Administered 2020-12-18: 200 mg via INTRAVENOUS
  Filled 2020-12-18: qty 10

## 2020-12-18 MED ORDER — SODIUM CHLORIDE 0.9 % IV SOLN: 200.0000 mg | Freq: Once | INTRAVENOUS | Status: DC

## 2020-12-18 NOTE — Patient Instructions (Signed)

## 2020-12-26 ENCOUNTER — Other Ambulatory Visit: Payer: Self-pay

## 2020-12-26 ENCOUNTER — Inpatient Hospital Stay (HOSPITAL_BASED_OUTPATIENT_CLINIC_OR_DEPARTMENT_OTHER): Payer: Medicare Other | Admitting: Internal Medicine

## 2020-12-26 ENCOUNTER — Inpatient Hospital Stay: Payer: Medicare Other

## 2020-12-26 ENCOUNTER — Encounter: Payer: Self-pay | Admitting: Internal Medicine

## 2020-12-26 VITALS — BP 124/74 | HR 78

## 2020-12-26 VITALS — BP 141/57 | HR 79 | Temp 98.9°F | Resp 22 | Ht 65.0 in | Wt 217.0 lb

## 2020-12-26 DIAGNOSIS — Z87891 Personal history of nicotine dependence: Secondary | ICD-10-CM | POA: Diagnosis not present

## 2020-12-26 DIAGNOSIS — D649 Anemia, unspecified: Secondary | ICD-10-CM

## 2020-12-26 MED ORDER — IRON SUCROSE 20 MG/ML IV SOLN
200.0000 mg | Freq: Once | INTRAVENOUS | Status: AC
  Administered 2020-12-26: 200 mg via INTRAVENOUS
  Filled 2020-12-26: qty 10

## 2020-12-26 MED ORDER — SODIUM CHLORIDE 0.9 % IV SOLN
Freq: Once | INTRAVENOUS | Status: AC
  Filled 2020-12-26: qty 250

## 2020-12-26 MED ORDER — SODIUM CHLORIDE 0.9 % IV SOLN: 200.0000 mg | Freq: Once | INTRAVENOUS | Status: DC

## 2020-12-26 NOTE — Progress Notes (Signed)
[place] NOTE  Patient Care Team: Tonia Ghent, MD as PCP - General Rockey Situ, Kathlene November, MD as Consulting Physician (Cardiology)  CHIEF COMPLAINTS/PURPOSE OF CONSULTATION: ANEMIA   HEMATOLOGY HISTORY:  # CHRONIC INTERMITTENT ANEMIA  [since 2010]- Hb SEP 2022- 8; previously on PO Iron- ; WBC/platelets- Normal; MCV- 90s. EGD- > 15 years; /Colonoscopy:4 years- [Dr.Jacob;; in [place]] capsule-? Bone marrow Biopsy-none; OCT 2022- SEVERE IRON DEFICIENCY; myeloma work-up/hemolysis work-up negative  #   AUG 2021- RIGHT LUNG stage I non-small cell lung cancer-  s/p SBRT   [Dr.Moody; [place]]  # COPD on 2.5 lit/day [Dr.Sood]; diastolic CHF/ OCT 1308-[facility] BP [needing to come off losartan- Dr.Gollan]; peripheral neuropathy.  HISTORY OF PRESENTING ILLNESS: Alone; 2.5 Lit/ O2 24 x7. In wheal chair.   Alexandria [name] 85 y.o.  female anemia is here today with results of her blood work/iron infusions.  Patient continues to complain of fatigue.  Shortness of breath with exertion.  Dizziness improved since taking of losartan per cardiology.  Patient tolerated IV iron infusion x1 fairly well.  No infusion reactions.  Review of Systems  Constitutional:  Positive for malaise/fatigue. Negative for chills, diaphoresis, fever and weight loss.  HENT:  Negative for nosebleeds and sore throat.   Eyes:  Negative for double vision.  Respiratory:  Positive for cough and shortness of breath. Negative for hemoptysis, sputum production and wheezing.   Cardiovascular:  Negative for chest pain, palpitations, orthopnea and leg swelling.  Gastrointestinal:  Negative for abdominal pain, blood in stool, constipation, diarrhea, heartburn, melena, nausea and vomiting.  Genitourinary:  Negative for dysuria, frequency and urgency.  Musculoskeletal:  Negative for back pain and joint pain.  Skin: Negative.  Negative for itching and rash.  Neurological:  Positive for dizziness. Negative for  tingling, focal weakness, weakness and headaches.  Endo/Heme/Allergies:  Does not bruise/bleed easily.  Psychiatric/Behavioral:  Negative for depression. The patient is not nervous/anxious and does not have insomnia.    MEDICAL HISTORY:  Past Medical History:  Diagnosis Date  . Allergy, unspecified not elsewhere classified   . Anxiety   . Cataract   . Chronic diastolic congestive heart failure ([place])   . COPD (chronic obstructive pulmonary disease) ([place]) 03/08/1998   PFTs 12/12/2002 FEV 1 1.42 (64%) ratio 58 with no better after B2 and DLCO75%; PFTs 11/20/09 FEV1 1.50 (73%) ratio 50 no better after B2 with DLCO 62%; Hfa 50% 11/20/2009 >75%, 01/13/10 p coaching  . Depression 12/07/1998  . Diabetes mellitus type II 02/05/2006   Dr. Cruzita Lederer with endo  . Disorders of bursae and tendons in shoulder region, unspecified    Rotator cuff syndrome, right  . E. coli bacteremia   . Esophagitis   . GERD (gastroesophageal reflux disease)   . History of UTI   . Hyperlipidemia 10/04/2000  . Hypertension 03/08/1992  . Hypothyroidism 03/08/1968  . Iron deficiency anemia   . Lung nodule    radiation starts 10-08-2019  . Malignant neoplasm of right upper lobe of lung ([place]) 07/24/2019  . Obesity    NOS  . OSA (obstructive sleep apnea)    PSG 01/27/10 AHI 13, pt does not know CPAP settings  . Peripheral neuropathy    Likely due to DM per Dr. Erling Cruz  . Ventral hernia     SURGICAL HISTORY: Past Surgical History:  Procedure Laterality Date  . ABD U/S  03/19/1999   Nml x2 foci in liver  . ADENOSINE MYOVIEW  06/02/2007   Nml  .  CARDIOLITE PERSANTINE  08/24/2000   Nml  . CAROTID U/S  08/24/2000   1-39% ICA stenosis  . CAROTID U/S  06/02/2007   No apprec change   . CARPAL TUNNEL RELEASE  12/1997   Right  . CESAREAN SECTION     x2 Breech/ repeat  . CHOLECYSTECTOMY  1997  . COLONOSCOPY WITH PROPOFOL N/A 02/12/2016   Procedure: COLONOSCOPY WITH PROPOFOL;  Surgeon: Milus Banister, MD;  Location: [facility]  ENDOSCOPY;  Service: Endoscopy;  Laterality: N/A;  . CT ABD W & PELVIS WO/W CM     Abd hemangiomas of liver, 1 cm R renal cyst  . DENTAL SURGERY  2016   Implants  . DEXA  07/03/2003   Nml  . ESOPHAGOGASTRODUODENOSCOPY  12/05/1997   Nml (due to hoarseness)  . ESOPHAGOGASTRODUODENOSCOPY (EGD) WITH PROPOFOL N/A 02/12/2016   Procedure: ESOPHAGOGASTRODUODENOSCOPY (EGD) WITH PROPOFOL;  Surgeon: Milus Banister, MD;  Location: [facility] ENDOSCOPY;  Service: Endoscopy;  Laterality: N/A;  . GALLBLADDER SURGERY    . HERNIA REPAIR  01/24/2009   Lap Ventr w/ Lysis of adhesions (Dr. Donne Hazel)  . knee arthroscopic surgery  years ago   right  . ROTATOR CUFF REPAIR  1984   Right, [place]  . SHOULDER OPEN ROTATOR CUFF REPAIR  02/08/2012   Procedure: ROTATOR CUFF REPAIR SHOULDER OPEN;  Surgeon: Magnus Sinning, MD;  Location: [facility] ORS;  Service: Orthopedics;  Laterality: Left;  Left Shoulder Open Anterior Acrominectomy Rotator Cuff Repair Open Distal Clavicle Resection ,tissue mend graft, and repair of biceps tendon  . THUMB RELEASE  12/1997   Right  . TONSILLECTOMY    . TOTAL ABDOMINAL HYSTERECTOMY  1985   Due to dysmennorhea  . US ECHOCARDIOGRAPHY  06/02/2007    SOCIAL HISTORY: Social History   Socioeconomic History  . Marital status: Married    Spouse name: Not on file  . Number of children: 2  . Years of education: Not on file  . Highest education level: Not on file  Occupational History  . Occupation: Retired    Fish farm manager: RETIRED  Tobacco Use  . Smoking status: Former    Packs/day: 2.00    Years: 50.00    Pack years: 100.00    Types: Cigarettes    Quit date: 02/05/2005    Years since quitting: 15.8  . Smokeless tobacco: Never  Vaping Use  . Vaping Use: Never used  Substance and Sexual Activity  . Alcohol use: Yes    Comment: occasionally  . Drug use: Never  . Sexual activity: Not Currently  Other Topics Concern  . Not on file  Social History Narrative   Married with 2  children; Enjoys painting      Quit smoking in 2007; no alcohol; lives in [place] with husband; [name]/d lives in [place]. Had own business. Dont drive- sec to neuropathy.    Social Determinants of Health   Financial Resource Strain: Not on file  Food Insecurity: Not on file  Transportation Needs: Not on file  Physical Activity: Not on file  Stress: Not on file  Social Connections: Not on file  Intimate Partner Violence: Not on file    FAMILY HISTORY: Family History  Problem Relation Age of Onset  . Heart disease Mother   . Thyroid disease Mother   . Emphysema Father        One lung  . Cystic fibrosis Sister   . Hyperthyroidism Sister   . Osteoarthritis Brother   . Hyperthyroidism Brother   . Esophageal  cancer Neg Hx   . Cancer Neg Hx        Head or neck  . Colon cancer Neg Hx   . Stomach cancer Neg Hx   . Breast cancer Neg Hx     ALLERGIES:  is allergic to antihistamines, diphenhydramine-type; sulfonamide derivatives; incruse ellipta [umeclidinium bromide]; clarithromycin; codeine; lyrica [pregabalin]; and spiriva handihaler [tiotropium bromide monohydrate].  MEDICATIONS:  Current Outpatient Medications  Medication Sig Dispense Refill  . albuterol (PROAIR HFA) 108 (90 Base) MCG/ACT inhaler Inhale 2 puffs into the lungs every 6 (six) hours as needed for wheezing or shortness of breath. INHALE 2 PUFFS INTO THE LUNGS EVERY 6 HOURS AS NEEDED FOR WHEEZING OR SHORTNESS OF BREATH 3 Inhaler 3  . busPIRone (BUSPAR) 15 MG tablet Take 15 mg by mouth 3 (three) times daily.    . Cholecalciferol (VITAMIN D) 50 MCG (2000 UT) tablet Take 1 tablet (2,000 Units total) by mouth daily.    . Coenzyme Q-10 200 MG CAPS Take 200 mg by mouth daily.    . Cyanocobalamin (B-12) 5000 MCG CAPS Take 5,000 mcg by mouth daily.    Marland Kitchen esomeprazole (NEXIUM) 40 MG capsule Take 40 mg by mouth every other day.     . ezetimibe (ZETIA) 10 MG tablet TAKE 1 TABLET BY MOUTH EVERYDAY AT BEDTIME 90 tablet 1  .  fexofenadine (ALLEGRA) 180 MG tablet Take 180 mg by mouth as needed.     Marland Kitchen FLUoxetine (PROZAC) 10 MG tablet Take 20 mg by mouth daily.    . fluticasone (FLONASE) 50 MCG/ACT nasal spray USE 2 SPRAYS INTO THE NOSE EVERY 12 HOURS AS NEEDED FOR STUFFY NOSE 48 mL 2  . Fluticasone-Salmeterol (ADVAIR) 100-50 MCG/DOSE AEPB Inhale 1 puff into the lungs 2 (two) times daily. 60 each 5  . furosemide (LASIX) 20 MG tablet TAKE 1 TABLET (20 MG TOTAL) BY MOUTH DAILY AS NEEDED FOR FLUID. 90 tablet 1  . glucose blood (ONETOUCH VERIO) test strip Use as instructed to check sugar 2-3x time daily 200 each 5  . Iron, Ferrous Sulfate, 325 (65 Fe) MG TABS Take 325 mg by mouth every other day. 90 tablet 3  . metFORMIN (GLUCOPHAGE) 500 MG tablet Take 1 tablet (500 mg total) by mouth daily with supper. 90 tablet 3  . ONETOUCH DELICA LANCETS FINE MISC USE TO CHECK SUGAR 1 TIME DAILY 100 each 5  . ONETOUCH VERIO test strip USE AS INSTRUCTED TO CHECK SUGAR 1 TIME DAILY 100 strip 8  . rosuvastatin (CRESTOR) 10 MG tablet TAKE 1 TABLET (10 MG TOTAL) BY MOUTH EVERY OTHER DAY. 45 tablet 2  . Spacer/Aero Chamber [employer] Use with  inhaler as needed.  J44.9 1 each 0  . SYNTHROID 150 MCG tablet TAKE 1 TABLET BY MOUTH DAILY BEFORE BREAKFAST. 90 tablet 2  . cariprazine (VRAYLAR) 1.5 MG capsule Take 1 capsule (1.5 mg total) by mouth daily. Tapering dose from 78m to 1.526mthen stopping after 2.5 weeks. (Patient not taking: Reported on 12/16/2020) 17 capsule 0  . EPINEPHrine (EPI-PEN) 0.3 mg/0.3 mL DEVI Inject 0.3 mg into the muscle once as needed (anaphylaxis). (Patient not taking: Reported on 12/26/2020)    . nitroGLYCERIN (NITROSTAT) 0.4 MG SL tablet Place 1 tablet (0.4 mg total) under the tongue every 5 (five) minutes as needed for chest pain. (Patient not taking: No sig reported) 25 tablet 3  . potassium chloride (K-DUR) 10 MEQ tablet Take 1 tablet (10 mEq total) by mouth daily as needed (Takes  with furosemide doses.). (Patient not  taking: No sig reported) 90 tablet 1  . predniSONE (DELTASONE) 20 MG tablet 2 tabs po for 4 days, then 1 tab po for 4 days (Patient not taking: No sig reported) 12 tablet 0   No current facility-administered medications for this visit.      PHYSICAL EXAMINATION:   Vitals:   12/26/20 1300  BP: (!) 141/57  Pulse: 79  Resp: (!) 22  Temp: 98.9 F (37.2 C)  SpO2: 100%   Filed Weights   12/26/20 1300  Weight: 217 lb (98.4 kg)    Physical Exam Vitals and nursing note reviewed.  HENT:     Head: Normocephalic and atraumatic.     Mouth/Throat:     Pharynx: Oropharynx is clear.  Eyes:     Extraocular Movements: Extraocular movements intact.     Pupils: Pupils are equal, round, and reactive to light.  Cardiovascular:     Rate and Rhythm: Normal rate and regular rhythm.  Pulmonary:     Comments: Decreased breath sounds bilaterally.  Scattered wheezing. Abdominal:     Palpations: Abdomen is soft.  Musculoskeletal:        General: Normal range of motion.     Cervical back: Normal range of motion.  Skin:    General: Skin is warm.  Neurological:     General: No focal deficit present.     Mental Status: She is alert and oriented to person, place, and time.  Psychiatric:        Behavior: Behavior normal.        Judgment: Judgment normal.    LABORATORY DATA:  I have reviewed the data as listed Lab Results  Component Value Date   WBC 4.6 12/16/2020   HGB 8.2 (L) 12/16/2020   HCT 27.6 (L) 12/16/2020   MCV 99.6 12/16/2020   PLT 200 12/16/2020   Recent Labs    01/25/20 1450 07/03/20 1214 09/12/20 0952 12/16/20 1209  NA 140  --  143 140  K 4.7  --  4.3 4.5  CL 102  --  103 100  CO2 28  --  35* 33*  GLUCOSE 96  --  157* 122*  BUN 22  --  29* 24*  CREATININE 0.86 0.90 0.98 0.74  CALCIUM 9.1  --  9.1 8.9  GFRNONAA  --   --   --  >60  PROT 6.7  --  6.7 6.5  ALBUMIN  --   --  3.9 3.4*  AST 11  --  10 14*  ALT 9  --  8 12  ALKPHOS  --   --  87 74  BILITOT 0.4  --   0.3 0.2*     No results found.  Symptomatic anemia  # Iron deficiency Anemia symptomatic hemoglobin 8.3-chronic intermittent [SINCE 2010]; proceed with Venofer No. 2 today.  Tolerated treatment fairly well.  # ETIOLOGY: ? Etiology- stool occult blood x2; [facility] September 2022-trace blood.  Discussed with patient that etiology of iron deficient unclear.  For now continue above iron infusions.  Consider GI evaluation/CT scan abdomen pelvis for further evaluation-next visit.  # DISPOSITION: # stool card x2  # venofer today # venofer weekly x 4 # follow up-in 4 weeks- NP; cbc/bmp- venofer- Dr.B   All questions were answered. The patient knows to call the clinic with any problems, questions or concerns.    Cammie Sickle, MD 12/26/2020 4:47 PM

## 2020-12-26 NOTE — Assessment & Plan Note (Addendum)
#   Iron deficiency Anemia symptomatic hemoglobin 8.3-chronic intermittent [SINCE 2010]; proceed with Venofer No. 2 today.  Tolerated treatment fairly well.  # ETIOLOGY: ? Etiology- stool occult blood x2; [facility] September 2022-trace blood.  Discussed with patient that etiology of iron deficient unclear.  For now continue above iron infusions.  Consider GI evaluation/CT scan abdomen pelvis for further evaluation-next visit.  # DISPOSITION: # stool card x2  # venofer today # venofer weekly x 4 # follow up-in 4 weeks- NP; cbc/bmp- venofer- Dr.B

## 2020-12-31 ENCOUNTER — Telehealth: Payer: Self-pay | Admitting: Family Medicine

## 2020-12-31 ENCOUNTER — Encounter: Payer: Self-pay | Admitting: Nurse Practitioner

## 2020-12-31 DIAGNOSIS — F4323 Adjustment disorder with mixed anxiety and depressed mood: Secondary | ICD-10-CM | POA: Diagnosis not present

## 2020-12-31 NOTE — Telephone Encounter (Signed)
Pt daughter  Lavella Lemons called in wants info pt last mammogram would like a call back [phone]

## 2020-12-31 NOTE — Telephone Encounter (Signed)
Spoke with patients daughter; okay per DPR. Patients last MM was done on 03/06/2020.

## 2021-01-01 ENCOUNTER — Ambulatory Visit: Payer: BC Managed Care – PPO

## 2021-01-05 ENCOUNTER — Other Ambulatory Visit: Payer: Self-pay

## 2021-01-05 ENCOUNTER — Inpatient Hospital Stay: Payer: Medicare Other

## 2021-01-05 VITALS — BP 136/74 | HR 80 | Resp 22

## 2021-01-05 DIAGNOSIS — D649 Anemia, unspecified: Secondary | ICD-10-CM

## 2021-01-05 DIAGNOSIS — Z87891 Personal history of nicotine dependence: Secondary | ICD-10-CM | POA: Diagnosis not present

## 2021-01-05 MED ORDER — IRON SUCROSE 20 MG/ML IV SOLN
200.0000 mg | Freq: Once | INTRAVENOUS | Status: AC
  Administered 2021-01-05: 200 mg via INTRAVENOUS
  Filled 2021-01-05: qty 10

## 2021-01-05 MED ORDER — SODIUM CHLORIDE 0.9 % IV SOLN: 200.0000 mg | Freq: Once | INTRAVENOUS | Status: DC

## 2021-01-05 MED ORDER — SODIUM CHLORIDE 0.9 % IV SOLN
Freq: Once | INTRAVENOUS | Status: AC
  Filled 2021-01-05: qty 250

## 2021-01-09 ENCOUNTER — Other Ambulatory Visit: Payer: Self-pay

## 2021-01-09 ENCOUNTER — Inpatient Hospital Stay: Payer: Medicare Other | Attending: Internal Medicine

## 2021-01-09 VITALS — BP 145/50 | HR 70 | Temp 98.0°F | Resp 20

## 2021-01-09 DIAGNOSIS — D509 Iron deficiency anemia, unspecified: Secondary | ICD-10-CM | POA: Diagnosis not present

## 2021-01-09 DIAGNOSIS — Z7952 Long term (current) use of systemic steroids: Secondary | ICD-10-CM | POA: Diagnosis not present

## 2021-01-09 DIAGNOSIS — E039 Hypothyroidism, unspecified: Secondary | ICD-10-CM | POA: Diagnosis not present

## 2021-01-09 DIAGNOSIS — Z87891 Personal history of nicotine dependence: Secondary | ICD-10-CM | POA: Insufficient documentation

## 2021-01-09 DIAGNOSIS — Z7984 Long term (current) use of oral hypoglycemic drugs: Secondary | ICD-10-CM | POA: Insufficient documentation

## 2021-01-09 DIAGNOSIS — Z7951 Long term (current) use of inhaled steroids: Secondary | ICD-10-CM | POA: Insufficient documentation

## 2021-01-09 DIAGNOSIS — Z85118 Personal history of other malignant neoplasm of bronchus and lung: Secondary | ICD-10-CM | POA: Insufficient documentation

## 2021-01-09 DIAGNOSIS — I5032 Chronic diastolic (congestive) heart failure: Secondary | ICD-10-CM | POA: Diagnosis not present

## 2021-01-09 DIAGNOSIS — I110 Hypertensive heart disease with heart failure: Secondary | ICD-10-CM | POA: Diagnosis not present

## 2021-01-09 DIAGNOSIS — J449 Chronic obstructive pulmonary disease, unspecified: Secondary | ICD-10-CM | POA: Diagnosis not present

## 2021-01-09 DIAGNOSIS — Z79899 Other long term (current) drug therapy: Secondary | ICD-10-CM | POA: Insufficient documentation

## 2021-01-09 DIAGNOSIS — D649 Anemia, unspecified: Secondary | ICD-10-CM

## 2021-01-09 MED ORDER — SODIUM CHLORIDE 0.9 % IV SOLN: 200.0000 mg | Freq: Once | INTRAVENOUS | Status: DC

## 2021-01-09 MED ORDER — SODIUM CHLORIDE 0.9 % IV SOLN
Freq: Once | INTRAVENOUS | Status: AC
  Filled 2021-01-09: qty 250

## 2021-01-09 MED ORDER — IRON SUCROSE 20 MG/ML IV SOLN
200.0000 mg | Freq: Once | INTRAVENOUS | Status: AC
  Administered 2021-01-09: 200 mg via INTRAVENOUS
  Filled 2021-01-09: qty 10

## 2021-01-09 NOTE — Patient Instructions (Signed)

## 2021-01-12 ENCOUNTER — Telehealth: Payer: Self-pay | Admitting: Internal Medicine

## 2021-01-12 ENCOUNTER — Telehealth: Payer: Self-pay | Admitting: Nurse Practitioner

## 2021-01-12 ENCOUNTER — Ambulatory Visit: Payer: Self-pay | Admitting: Radiation Oncology

## 2021-01-12 NOTE — Telephone Encounter (Signed)
LIke this person is getting things at [facility] and here so what is the deal do we know.

## 2021-01-12 NOTE — Telephone Encounter (Signed)
Daughter Lavella Lemons left voicemail with answering service requesting a call back to move her mothers infusion appointments around.  Routing to scheduler for follow up.

## 2021-01-12 NOTE — Telephone Encounter (Signed)
Spoke with daughter- highly encouraged pt to keep these scheduled apt as her hgb is very low. Daughter states pt has many apts in the next few days. She may need to cnl this week's infusions and r/s some other doc's apt next week to make the 11/17 apt work. She will call our office back in the morning to clarify what she needs to do.

## 2021-01-12 NOTE — Telephone Encounter (Signed)
The apt @ [facility] is for follow-up radiation. Pt has an apt on 11/17 with Lauren, NP- pls coordinate with daughter to see when she wants to bring pt for this apt.

## 2021-01-12 NOTE — Telephone Encounter (Signed)
Daughter called and wants to reschedule pt appt. Please give her a call back at [phone]

## 2021-01-13 ENCOUNTER — Encounter: Payer: Self-pay | Admitting: Internal Medicine

## 2021-01-13 ENCOUNTER — Telehealth: Payer: Self-pay | Admitting: Cardiovascular Disease

## 2021-01-13 NOTE — Telephone Encounter (Signed)
  Patient Consent for Virtual Visit        Alexandria [name] has provided verbal consent on 01/13/2021 for a virtual visit (video or telephone).   CONSENT FOR VIRTUAL VISIT FOR:  Alexandria [name]  By participating in this virtual visit I agree to the following:  I hereby voluntarily request, consent and authorize [facility] and its employed or contracted physicians, physician assistants, nurse practitioners or other licensed health care professionals (the Practitioner), to provide me with telemedicine health care services (the "Services") as deemed necessary by the treating Practitioner. I acknowledge and consent to receive the Services by the Practitioner via telemedicine. I understand that the telemedicine visit will involve communicating with the Practitioner through live audiovisual communication technology and the disclosure of certain medical information by electronic transmission. I acknowledge that I have been given the opportunity to request an in-person assessment or other available alternative prior to the telemedicine visit and am voluntarily participating in the telemedicine visit.  I understand that I have the right to withhold or withdraw my consent to the use of telemedicine in the course of my care at any time, without affecting my right to future care or treatment, and that the Practitioner or I may terminate the telemedicine visit at any time. I understand that I have the right to inspect all information obtained and/or recorded in the course of the telemedicine visit and may receive copies of available information for a reasonable fee.  I understand that some of the potential risks of receiving the Services via telemedicine include:  Delay or interruption in medical evaluation due to technological equipment failure or disruption; Information transmitted may not be sufficient (e.g. poor resolution of images) to allow for appropriate medical decision making by the Practitioner;  and/or  In rare instances, security protocols could fail, causing a breach of personal health information.  Furthermore, I acknowledge that it is my responsibility to provide information about my medical history, conditions and care that is complete and accurate to the best of my ability. I acknowledge that Practitioner's advice, recommendations, and/or decision may be based on factors not within their control, such as incomplete or inaccurate data provided by me or distortions of diagnostic images or specimens that may result from electronic transmissions. I understand that the practice of medicine is not an exact science and that Practitioner makes no warranties or guarantees regarding treatment outcomes. I acknowledge that a copy of this consent can be made available to me via my patient portal ([facility]), or I can request a printed copy by calling the office of [facility].    I understand that my insurance will be billed for this visit.   I have read or had this consent read to me. I understand the contents of this consent, which adequately explains the benefits and risks of the Services being provided via telemedicine.  I have been provided ample opportunity to ask questions regarding this consent and the Services and have had my questions answered to my satisfaction. I give my informed consent for the services to be provided through the use of telemedicine in my medical care

## 2021-01-13 NOTE — Telephone Encounter (Signed)
Daughter called back this morning and spoke with colette- apt changes made per daughter's preferences.

## 2021-01-15 ENCOUNTER — Ambulatory Visit: Payer: BC Managed Care – PPO

## 2021-01-15 ENCOUNTER — Other Ambulatory Visit: Payer: Self-pay

## 2021-01-15 ENCOUNTER — Ambulatory Visit
Admission: RE | Admit: 2021-01-15 | Discharge: 2021-01-15 | Disposition: A | Discharge status: Home / Self Care | Payer: Medicare Other | Source: Ambulatory Visit | Attending: Radiation Oncology | Admitting: Radiation Oncology

## 2021-01-15 ENCOUNTER — Ambulatory Visit (HOSPITAL_COMMUNITY): Payer: BC Managed Care – PPO

## 2021-01-15 ENCOUNTER — Ambulatory Visit (HOSPITAL_COMMUNITY)
Admission: RE | Admit: 2021-01-15 | Discharge: 2021-01-15 | Disposition: A | Discharge status: Home / Self Care | Payer: Medicare Other | Source: Ambulatory Visit | Attending: Radiation Oncology | Admitting: Radiation Oncology

## 2021-01-15 DIAGNOSIS — C3411 Malignant neoplasm of upper lobe, right bronchus or lung: Secondary | ICD-10-CM

## 2021-01-15 DIAGNOSIS — R918 Other nonspecific abnormal finding of lung field: Secondary | ICD-10-CM | POA: Diagnosis not present

## 2021-01-15 DIAGNOSIS — I700 Atherosclerosis of aorta: Secondary | ICD-10-CM | POA: Diagnosis not present

## 2021-01-15 DIAGNOSIS — R911 Solitary pulmonary nodule: Secondary | ICD-10-CM | POA: Diagnosis not present

## 2021-01-15 LAB — BUN & CREATININE (CHCC)
BUN: 25 mg/dL — ABNORMAL HIGH (ref 8–23)
Creatinine: 0.74 mg/dL (ref 0.44–1.00)
GFR, Estimated: >60 mL/min (ref >60)

## 2021-01-15 MED ORDER — IOHEXOL 350 MG/ML SOLN
75.0000 mL | Freq: Once | INTRAVENOUS | Status: AC | PRN
  Administered 2021-01-15: 60 mL via INTRAVENOUS

## 2021-01-16 ENCOUNTER — Inpatient Hospital Stay: Payer: Medicare Other

## 2021-01-19 ENCOUNTER — Ambulatory Visit: Payer: BC Managed Care – PPO | Admitting: Radiation Oncology

## 2021-01-20 ENCOUNTER — Ambulatory Visit
Admission: RE | Admit: 2021-01-20 | Discharge: 2021-01-20 | Disposition: A | Discharge status: Home / Self Care | Payer: Medicare Other | Source: Ambulatory Visit | Attending: Radiation Oncology | Admitting: Radiation Oncology

## 2021-01-20 ENCOUNTER — Encounter: Payer: Self-pay | Admitting: Radiation Oncology

## 2021-01-20 DIAGNOSIS — Z08 Encounter for follow-up examination after completed treatment for malignant neoplasm: Secondary | ICD-10-CM | POA: Diagnosis not present

## 2021-01-20 DIAGNOSIS — Z85118 Personal history of other malignant neoplasm of bronchus and lung: Secondary | ICD-10-CM | POA: Diagnosis not present

## 2021-01-20 DIAGNOSIS — C3411 Malignant neoplasm of upper lobe, right bronchus or lung: Secondary | ICD-10-CM

## 2021-01-20 NOTE — Progress Notes (Signed)
Patient states doing well. No symptoms reported at this time.  Meaningful use complete.  Patient notified of 3:30pm-01/20/21 telephone appointment and verbalized understanding.  Patient requests that we contact her daughter "Alexandria [name]" to facilitate this appointment. [phone]

## 2021-01-20 NOTE — Progress Notes (Signed)
Radiation Oncology         [phone] ________________________________   Name: Alexandria [name]        MRN: [id number]  Date of Service: 01/20/2021 DOB: 1935-03-21  HB:[id number], Alexandria Rising, MD  Garner Nash, DO     REFERRING PHYSICIAN: Garner Nash, DO   DIAGNOSIS: The encounter diagnosis was Malignant neoplasm of right upper lobe of lung ([place]).   HISTORY OF PRESENT ILLNESS: Alexandria [name] is a 85 y.o. female with a history of putative Stage IA3, cT1cN0M0, NSCLC of the RUL. The patient has a history of Oxygen Dependant COPD at 2-3 L [place] several years.  She had been followed as well with nodules in both lungs since about 2017. CT imaging of the chest on 06/13/19 revealed an increase in size in a dominant lesion in the apical RUL. This measured 2.2 x 1.2 cm, she had other stable nodules. PET imaging on 07/18/19 did reveal increased metabolic activity in the RUL lesion with an SUV of 2.2 despite blood pool of 3.32. She was counseled on the option of bronchoscopy for diagnosis of the presumed early stage lung cancer but was not interested in having any invasive procedures, or surgery. As a result, she proceeded with stereotactic body radiotherapy (SBRT) which she completed in August 2021.   The patient is continued to be without residual disease and CT of the chest with contrast on 01/15/2021 showed evolving changes in the postradiation fibrosis of the apex of the right upper lobe with no concerns for residual disease.  Persistent atherosclerotic disease as well as calcifications of the aortic valve and mitral annulus were noted.  No new or suspicious appearing pulmonary nodules were noted but stability in several 2 to 3 mm nodules were again seen and felt to be stable representing benign findings.  She had persistent findings in the segment 2 of the liver measuring 3.9 cm felt to be stable in comparison and also consistent with cavernous hemangioma.  A well-defined sclerotic lesion in the narrow  zone of the transition of the lateral aspect of the left seventh rib was stable and not of concern based on the description of radiology report it was felt that this likely represented a bone island.  She and her daughter are contacted to review these results. Of note she has been under the care of Dr. Rogue Bussing for iron deficiency anemia. She may have another CT scan to look for sites of loss versus GI evaluation. Her last colonoscopy was in 2017.    PREVIOUS RADIATION THERAPY:    10/11/19 - 10/18/19 SBRT: The patient was treated to the right lung with a course of stereotactic body radiation treatment.  The patient received 54 Gray in 3 fractions using a IMRT/SBRT technique, with 3 fields.  PAST MEDICAL HISTORY:  Past Medical History:  Diagnosis Date   Allergy, unspecified not elsewhere classified    Anxiety    Cataract    Chronic diastolic congestive heart failure (HCC)    COPD (chronic obstructive pulmonary disease) ([place]) 03/08/1998   PFTs 12/12/2002 FEV 1 1.42 (64%) ratio 58 with no better after B2 and DLCO75%; PFTs 11/20/09 FEV1 1.50 (73%) ratio 50 no better after B2 with DLCO 62%; Hfa 50% 11/20/2009 >75%, 01/13/10 p coaching   Depression 12/07/1998   Diabetes mellitus type II 02/05/2006   Dr. Cruzita Lederer with endo   Disorders of bursae and tendons in shoulder region, unspecified    Rotator cuff syndrome, right   E. coli bacteremia  Esophagitis    GERD (gastroesophageal reflux disease)    History of UTI    Hyperlipidemia 10/04/2000   Hypertension 03/08/1992   Hypothyroidism 03/08/1968   Iron deficiency anemia    Lung nodule    radiation starts 10-08-2019   Malignant neoplasm of right upper lobe of lung ([place]) 07/24/2019   Obesity    NOS   OSA (obstructive sleep apnea)    PSG 01/27/10 AHI 13, pt does not know CPAP settings   Peripheral neuropathy    Likely due to DM per Dr. Murvin Natal hernia        PAST SURGICAL HISTORY: Past Surgical History:  Procedure Laterality Date    ABD U/S  03/19/1999   Nml x2 foci in liver   ADENOSINE MYOVIEW  06/02/2007   Nml   CARDIOLITE PERSANTINE  08/24/2000   Nml   CAROTID U/S  08/24/2000   1-39% ICA stenosis   CAROTID U/S  06/02/2007   No apprec change    CARPAL TUNNEL RELEASE  12/1997   Right   CESAREAN SECTION     x2 [place]/ repeat   CHOLECYSTECTOMY  1997   COLONOSCOPY WITH PROPOFOL N/A 02/12/2016   Procedure: COLONOSCOPY WITH PROPOFOL;  Surgeon: Milus Banister, MD;  Location: [facility];  Service: Endoscopy;  Laterality: N/A;   CT ABD W & PELVIS WO/W CM     Abd hemangiomas of liver, 1 cm R renal cyst   DENTAL SURGERY  2016   Implants   DEXA  07/03/2003   Nml   ESOPHAGOGASTRODUODENOSCOPY  12/05/1997   Nml (due to hoarseness)   ESOPHAGOGASTRODUODENOSCOPY (EGD) WITH PROPOFOL N/A 02/12/2016   Procedure: ESOPHAGOGASTRODUODENOSCOPY (EGD) WITH PROPOFOL;  Surgeon: Milus Banister, MD;  Location: [facility];  Service: Endoscopy;  Laterality: N/A;   GALLBLADDER SURGERY     HERNIA REPAIR  01/24/2009   Lap Ventr w/ Lysis of adhesions (Dr. Donne Hazel)   knee arthroscopic surgery  years ago   right   ROTATOR CUFF REPAIR  1984   Right, [place]   SHOULDER OPEN ROTATOR CUFF REPAIR  02/08/2012   Procedure: ROTATOR CUFF REPAIR SHOULDER OPEN;  Surgeon: Magnus Sinning, MD;  Location: [facility];  Service: Orthopedics;  Laterality: Left;  Left Shoulder Open Anterior Acrominectomy Rotator Cuff Repair Open Distal Clavicle Resection ,tissue mend graft, and repair of biceps tendon   THUMB RELEASE  12/1997   Right   TONSILLECTOMY     TOTAL ABDOMINAL HYSTERECTOMY  1985   Due to dysmennorhea   US ECHOCARDIOGRAPHY  06/02/2007     FAMILY HISTORY:  Family History  Problem Relation Age of Onset   Heart disease Mother    Thyroid disease Mother    Emphysema Father        One lung   Cystic fibrosis Sister    Hyperthyroidism Sister    Osteoarthritis Brother    Hyperthyroidism Brother    Esophageal cancer Neg Hx    Cancer Neg Hx         Head or neck   Colon cancer Neg Hx    Stomach cancer Neg Hx    Breast cancer Neg Hx      SOCIAL HISTORY:  reports that she quit smoking about 15 years ago. Her smoking use included cigarettes. She has a 100.00 pack-year smoking history. She has never used smokeless tobacco. She reports current alcohol use. She reports that she does not use drugs. The patient is married and lives in [place]. She is often  helped by her daughter Lavella Lemons who lives in [place].   ALLERGIES: Antihistamines, diphenhydramine-type; Sulfonamide derivatives; Incruse ellipta [umeclidinium bromide]; Clarithromycin; Codeine; Lyrica [pregabalin]; and Spiriva handihaler [tiotropium bromide monohydrate]   MEDICATIONS:  Current Outpatient Medications  Medication Sig Dispense Refill   albuterol (PROAIR HFA) 108 (90 Base) MCG/ACT inhaler Inhale 2 puffs into the lungs every 6 (six) hours as needed for wheezing or shortness of breath. INHALE 2 PUFFS INTO THE LUNGS EVERY 6 HOURS AS NEEDED FOR WHEEZING OR SHORTNESS OF BREATH 3 Inhaler 3   busPIRone (BUSPAR) 15 MG tablet Take 15 mg by mouth 3 (three) times daily.     cariprazine (VRAYLAR) 1.5 MG capsule Take 1 capsule (1.5 mg total) by mouth daily. Tapering dose from 3mg  to 1.5mg  then stopping after 2.5 weeks. (Patient not taking: Reported on 12/16/2020) 17 capsule 0   Cholecalciferol (VITAMIN D) 50 MCG (2000 UT) tablet Take 1 tablet (2,000 Units total) by mouth daily.     Coenzyme Q-10 200 MG CAPS Take 200 mg by mouth daily.     Cyanocobalamin (B-12) 5000 MCG CAPS Take 5,000 mcg by mouth daily.     EPINEPHrine (EPI-PEN) 0.3 mg/0.3 mL DEVI Inject 0.3 mg into the muscle once as needed (anaphylaxis). (Patient not taking: Reported on 12/26/2020)     esomeprazole (NEXIUM) 40 MG capsule Take 40 mg by mouth every other day.      ezetimibe (ZETIA) 10 MG tablet TAKE 1 TABLET BY MOUTH EVERYDAY AT BEDTIME 90 tablet 1   fexofenadine (ALLEGRA) 180 MG tablet Take 180 mg by mouth as  needed.      FLUoxetine (PROZAC) 10 MG tablet Take 20 mg by mouth daily.     fluticasone (FLONASE) 50 MCG/ACT nasal spray USE 2 SPRAYS INTO THE NOSE EVERY 12 HOURS AS NEEDED FOR STUFFY NOSE 48 mL 2   Fluticasone-Salmeterol (ADVAIR) 100-50 MCG/DOSE AEPB Inhale 1 puff into the lungs 2 (two) times daily. 60 each 5   furosemide (LASIX) 20 MG tablet TAKE 1 TABLET (20 MG TOTAL) BY MOUTH DAILY AS NEEDED FOR FLUID. 90 tablet 1   glucose blood (ONETOUCH VERIO) test strip Use as instructed to check sugar 2-3x time daily 200 each 5   Iron, Ferrous Sulfate, 325 (65 Fe) MG TABS Take 325 mg by mouth every other day. 90 tablet 3   metFORMIN (GLUCOPHAGE) 500 MG tablet Take 1 tablet (500 mg total) by mouth daily with supper. 90 tablet 3   nitroGLYCERIN (NITROSTAT) 0.4 MG SL tablet Place 1 tablet (0.4 mg total) under the tongue every 5 (five) minutes as needed for chest pain. (Patient not taking: No sig reported) 25 tablet 3   ONETOUCH DELICA LANCETS FINE MISC USE TO CHECK SUGAR 1 TIME DAILY 100 each 5   ONETOUCH VERIO test strip USE AS INSTRUCTED TO CHECK SUGAR 1 TIME DAILY 100 strip 8   potassium chloride (K-DUR) 10 MEQ tablet Take 1 tablet (10 mEq total) by mouth daily as needed (Takes with furosemide doses.). (Patient not taking: No sig reported) 90 tablet 1   predniSONE (DELTASONE) 20 MG tablet 2 tabs po for 4 days, then 1 tab po for 4 days (Patient not taking: No sig reported) 12 tablet 0   rosuvastatin (CRESTOR) 10 MG tablet TAKE 1 TABLET (10 MG TOTAL) BY MOUTH EVERY OTHER DAY. 45 tablet 2   Spacer/Aero Chamber Mouthpiece MISC Use with  inhaler as needed.  J44.9 1 each 0   SYNTHROID 150 MCG tablet TAKE 1 TABLET BY MOUTH  DAILY BEFORE BREAKFAST. 90 tablet 2   No current facility-administered medications for this encounter.     REVIEW OF SYSTEMS: On review of systems, the patient reports that she is doing pretty well overall. She continues O2 at 2-3 L via [place] without new symptoms of shortness of breath,  changes in her overall function, cough, or fevers. No other complaints are noted.    PHYSICAL EXAM:  Unable to assess due to encounter type.   ECOG = 0 0 - Asymptomatic (Fully active, able to carry on all predisease activities without restriction)  1 - Symptomatic but completely ambulatory (Restricted in physically strenuous activity but ambulatory and able to carry out work of a light or sedentary nature. For example, light housework, office work)  2 - Symptomatic, <50% in bed during the day (Ambulatory and capable of all self care but unable to carry out any work activities. Up and about more than 50% of waking hours)  3 - Symptomatic, >50% in bed, but not bedbound (Capable of only limited self-care, confined to bed or chair 50% or more of waking hours)  4 - Bedbound (Completely disabled. Cannot carry on any self-care. Totally confined to bed or chair)  5 - Death   Eustace Pen MM, Creech RH, Tormey DC, et al. [phone]). "Toxicity and response criteria of the [facility]". [place] Oncol. 5 (6): 649-55    LABORATORY DATA:  Lab Results  Component Value Date   WBC 4.6 12/16/2020   HGB 8.2 (L) 12/16/2020   HCT 27.6 (L) 12/16/2020   MCV 99.6 12/16/2020   PLT 200 12/16/2020   Lab Results  Component Value Date   NA 140 12/16/2020   K 4.5 12/16/2020   CL 100 12/16/2020   CO2 33 (H) 12/16/2020   Lab Results  Component Value Date   ALT 12 12/16/2020   AST 14 (L) 12/16/2020   ALKPHOS 74 12/16/2020   BILITOT 0.2 (L) 12/16/2020      RADIOGRAPHY: CT CHEST W CONTRAST  Result Date: 01/16/2021 CLINICAL DATA:  85 year old female with history of non-small cell lung cancer status post SB RT. Follow-up study. EXAM: CT CHEST WITH CONTRAST TECHNIQUE: Multidetector CT imaging of the chest was performed during intravenous contrast administration. CONTRAST:  46mL OMNIPAQUE IOHEXOL 350 MG/ML SOLN COMPARISON:  Chest CT 07/03/2020. FINDINGS: Cardiovascular: Heart size is  normal. There is no significant pericardial fluid, thickening or pericardial calcification. There is aortic atherosclerosis, as well as atherosclerosis of the great vessels of the mediastinum and the coronary arteries, including calcified atherosclerotic plaque in the left main, left anterior descending, left circumflex and right coronary arteries. Severe calcifications of the mitral annulus. Mild thickening calcification of the aortic valve. Mediastinum/Nodes: No pathologically enlarged mediastinal or hilar lymph nodes. Esophagus is unremarkable in appearance. No axillary lymphadenopathy. Lungs/Pleura: There continues to be areas of ground-glass attenuation, septal thickening, nodular architectural distortion and end of all vein band like opacity in the right upper lobe near the apex at the site of treated pulmonary nodule. This appears to show progressive bandlike fibrosis compared to the prior study, without definitive evidence of local recurrence of disease (areas of nodularity appear stable compared to the prior study). A few other scattered 2-3 mm pulmonary nodules are noted in the lungs bilaterally, nonspecific, but stable compared to the prior study and statistically likely benign. A no other new suspicious appearing pulmonary nodules or masses are noted. No acute consolidative airspace disease. No pleural effusions. Upper Abdomen: Status post cholecystectomy.  Aortic atherosclerosis. Exophytic peripherally enhancing lesion extending from the posteromedial aspect of segment 2 of the liver (axial image 124 of series 2) measuring 3.8 x 2.9 cm, similar to numerous prior examinations, compatible with a cavernous hemangioma. Colonic diverticulosis. Musculoskeletal: Well-defined sclerotic lesion with narrow zone of transition in the lateral aspect of the left seventh rib, stable and not hypermetabolic on prior PET-CT, presumably a bone island. There are no other aggressive appearing lytic or blastic lesions noted  in the visualized portions of the skeleton. IMPRESSION: 1. Evolving changes of postradiation fibrosis in the apex of the right upper lobe, as above, with no definitive findings to suggest locally recurrent disease or definite metastatic disease in the thorax. 2. Aortic atherosclerosis, in addition to left main and 3 vessel coronary artery disease. Please note that although the presence of coronary artery calcium documents the presence of coronary artery disease, the severity of this disease and any potential stenosis cannot be assessed on this non-gated CT examination. Assessment for potential risk factor modification, dietary therapy or pharmacologic therapy may be warranted, if clinically indicated. 3. There are calcifications of the aortic valve and mitral annulus. Echocardiographic correlation for evaluation of potential valvular dysfunction may be warranted if clinically indicated. Electronically Signed   By: Vinnie Langton M.D.   On: 01/16/2021 10:31        IMPRESSION/PLAN: 1. Putative Stage IA3, cT1cN0M0, NSCLC of the RUL. The patient continues to be radiographically without disease. We discussed the rationale for continued imaging of the chest with CT scan per NCCN guidelines  at 6 month intervals. She prefers scans to still be at [facility], but has the option to have these at [facility] if she desires. She is in agreement our plans and will contact us sooner if she has questions or concerns prior to that next interval. 2. O2 dependent COPD. The patient will continue with Dr. Halford Chessman for follow up and we will follow her course expectantly.  3. Iron deficiency anemia. She will follow up with Dr. Rogue Bussing as well. We will follow this expectantly.     Given current concerns for patient exposure during the COVID-19 pandemic, this encounter was conducted via telephone.  The patient has provided two factor identification and has given verbal consent for this type of encounter and has been  advised to only accept a meeting of this type in a secure network environment. The time spent during this encounter was 40 minutes including preparation, discussion, and coordination of the patient's care. The attendants for this meeting [place]  and LILYGRACE RODICK and her daughter Adah Salvage. During the encounter,  Hayden Pedro was located at [facility] Radiation Oncology Department.  ZALEA PETE was located at home, her daughter Adah Salvage was located at her own home.     Carola Rhine, PAC

## 2021-01-21 ENCOUNTER — Inpatient Hospital Stay: Payer: Medicare Other

## 2021-01-21 ENCOUNTER — Other Ambulatory Visit: Payer: BC Managed Care – PPO

## 2021-01-21 ENCOUNTER — Other Ambulatory Visit: Payer: Self-pay

## 2021-01-21 VITALS — BP 135/49 | HR 69 | Temp 98.3°F | Resp 16

## 2021-01-21 DIAGNOSIS — D649 Anemia, unspecified: Secondary | ICD-10-CM

## 2021-01-21 DIAGNOSIS — Z87891 Personal history of nicotine dependence: Secondary | ICD-10-CM | POA: Diagnosis not present

## 2021-01-21 DIAGNOSIS — E039 Hypothyroidism, unspecified: Secondary | ICD-10-CM | POA: Diagnosis not present

## 2021-01-21 DIAGNOSIS — D509 Iron deficiency anemia, unspecified: Secondary | ICD-10-CM | POA: Diagnosis not present

## 2021-01-21 DIAGNOSIS — I110 Hypertensive heart disease with heart failure: Secondary | ICD-10-CM | POA: Diagnosis not present

## 2021-01-21 DIAGNOSIS — Z85118 Personal history of other malignant neoplasm of bronchus and lung: Secondary | ICD-10-CM | POA: Diagnosis not present

## 2021-01-21 DIAGNOSIS — I5032 Chronic diastolic (congestive) heart failure: Secondary | ICD-10-CM | POA: Diagnosis not present

## 2021-01-21 MED ORDER — IRON SUCROSE 20 MG/ML IV SOLN
200.0000 mg | Freq: Once | INTRAVENOUS | Status: AC
  Administered 2021-01-21: 200 mg via INTRAVENOUS
  Filled 2021-01-21: qty 10

## 2021-01-21 MED ORDER — SODIUM CHLORIDE 0.9 % IV SOLN: 200.0000 mg | Freq: Once | INTRAVENOUS | Status: DC

## 2021-01-21 MED ORDER — SODIUM CHLORIDE 0.9 % IV SOLN
Freq: Once | INTRAVENOUS | Status: AC
  Filled 2021-01-21: qty 250

## 2021-01-21 NOTE — Patient Instructions (Signed)

## 2021-01-22 ENCOUNTER — Encounter: Payer: Self-pay | Admitting: Internal Medicine

## 2021-01-22 ENCOUNTER — Other Ambulatory Visit: Payer: BC Managed Care – PPO

## 2021-01-22 ENCOUNTER — Ambulatory Visit: Payer: BC Managed Care – PPO

## 2021-01-22 ENCOUNTER — Ambulatory Visit: Payer: BC Managed Care – PPO | Admitting: Nurse Practitioner

## 2021-01-23 ENCOUNTER — Ambulatory Visit: Payer: BC Managed Care – PPO | Admitting: Nurse Practitioner

## 2021-01-26 ENCOUNTER — Telehealth: Payer: Self-pay | Admitting: Cardiovascular Disease

## 2021-01-26 NOTE — Telephone Encounter (Signed)
Was able to return call to pt's daughter Alexandria [name] (DPR approved) to f/u on Alexandria [name] BP. Alexandria [name] reports pt reports dizziness and low BP.  109/53 100/50  103/48  110/50  Advised based on Dr. Donivan Scull last [place] notes from 12/2020 In follow-up today, her daughter is on the phone She reports blood pressures running very low at home Having orthostasis symptoms 70 to 90 systolic Orthostasis/hypotension Etiology unclear We will stop the valsartan for now Daughter on the phone reports systolic pressures commonly in the 80 range Echocardiogram ordered   [name] confirms pt has stop the losartan. States pt is more dizzy when turning or changing position. Advised may need to reach out to PCP to r/u vertigo or inner ear infection due to dizziness/lightheadedness worse with turning. [name] verbalized understanding.  Also suggested to change positions slowly to reduce/prevent falls or injuries, increase water and salt intake to see if that helps with increasing BP as well. If symptoms gets worse, other symptoms emerge then week the ED for further evaluation. Advised will send to Dr. Rockey Situ for advice, should expect response in a few days, until then, speak with PCP, increase water and salt intake slightly, continue to monitor BP/HR.  Alexandria [name] thankful for the return call and recommendations.

## 2021-01-26 NOTE — Telephone Encounter (Signed)
Pt c/o BP issue: STAT if pt c/o blurred vision, one-sided weakness or slurred speech  1. What are your last 5 BP readings?   LOG per daughter   109/53 100/50  103/48  Trend the same as above   2. Are you having any other symptoms (ex. Dizziness, headache, blurred vision, passed out)?   patient previously not communicating but now reports always lightheaded upon ambulating   3. What is your BP issue? Please call daughter to discuss

## 2021-01-28 ENCOUNTER — Inpatient Hospital Stay: Payer: Medicare Other

## 2021-01-28 ENCOUNTER — Inpatient Hospital Stay (HOSPITAL_BASED_OUTPATIENT_CLINIC_OR_DEPARTMENT_OTHER): Payer: Medicare Other | Admitting: Internal Medicine

## 2021-01-28 ENCOUNTER — Encounter: Payer: Self-pay | Admitting: Internal Medicine

## 2021-01-28 ENCOUNTER — Other Ambulatory Visit: Payer: Self-pay

## 2021-01-28 VITALS — BP 133/65 | HR 77 | Temp 98.7°F | Resp 20 | Wt 212.8 lb

## 2021-01-28 VITALS — BP 153/47 | HR 78

## 2021-01-28 DIAGNOSIS — R634 Abnormal weight loss: Secondary | ICD-10-CM

## 2021-01-28 DIAGNOSIS — Z85118 Personal history of other malignant neoplasm of bronchus and lung: Secondary | ICD-10-CM | POA: Diagnosis not present

## 2021-01-28 DIAGNOSIS — I5032 Chronic diastolic (congestive) heart failure: Secondary | ICD-10-CM | POA: Diagnosis not present

## 2021-01-28 DIAGNOSIS — E039 Hypothyroidism, unspecified: Secondary | ICD-10-CM | POA: Diagnosis not present

## 2021-01-28 DIAGNOSIS — D649 Anemia, unspecified: Secondary | ICD-10-CM | POA: Diagnosis not present

## 2021-01-28 DIAGNOSIS — D509 Iron deficiency anemia, unspecified: Secondary | ICD-10-CM | POA: Diagnosis not present

## 2021-01-28 DIAGNOSIS — Z87891 Personal history of nicotine dependence: Secondary | ICD-10-CM | POA: Diagnosis not present

## 2021-01-28 DIAGNOSIS — I110 Hypertensive heart disease with heart failure: Secondary | ICD-10-CM | POA: Diagnosis not present

## 2021-01-28 LAB — BASIC METABOLIC PANEL
Anion gap: 7 (ref 5–15)
BUN: 22 mg/dL (ref 8–23)
CO2: 34 mmol/L — ABNORMAL HIGH (ref 22–32)
Calcium: 8.7 mg/dL — ABNORMAL LOW (ref 8.9–10.3)
Chloride: 100 mmol/L (ref 98–111)
Creatinine, Ser: 0.79 mg/dL (ref 0.44–1.00)
GFR, Estimated: >60 mL/min (ref >60)
Glucose, Bld: 119 mg/dL — ABNORMAL HIGH (ref 70–99)
Potassium: 4.6 mmol/L (ref 3.5–5.1)
Sodium: 141 mmol/L (ref 135–145)

## 2021-01-28 LAB — CBC WITH DIFFERENTIAL/PLATELET
Abs Immature Granulocytes: 0.03 10*3/uL (ref 0.00–0.07)
Basophils Absolute: 0 10*3/uL (ref 0.0–0.1)
Basophils Relative: 0 %
Eosinophils Absolute: 0.1 10*3/uL (ref 0.0–0.5)
Eosinophils Relative: 2 %
HCT: 31.5 % — ABNORMAL LOW (ref 36.0–46.0)
Hemoglobin: 9.6 g/dL — ABNORMAL LOW (ref 12.0–15.0)
Immature Granulocytes: 1 %
Lymphocytes Relative: 19 %
Lymphs Abs: 1 10*3/uL (ref 0.7–4.0)
MCH: 30.6 pg (ref 26.0–34.0)
MCHC: 30.5 g/dL (ref 30.0–36.0)
MCV: 100.3 fL — ABNORMAL HIGH (ref 80.0–100.0)
Monocytes Absolute: 0.3 10*3/uL (ref 0.1–1.0)
Monocytes Relative: 6 %
Neutro Abs: 4 10*3/uL (ref 1.7–7.7)
Neutrophils Relative %: 72 %
Platelets: 195 10*3/uL (ref 150–400)
RBC: 3.14 MIL/uL — ABNORMAL LOW (ref 3.87–5.11)
RDW: 14.5 % (ref 11.5–15.5)
WBC: 5.5 10*3/uL (ref 4.0–10.5)
nRBC: 0 % (ref 0.0–0.2)

## 2021-01-28 MED ORDER — IRON SUCROSE 20 MG/ML IV SOLN
200.0000 mg | Freq: Once | INTRAVENOUS | Status: AC
  Administered 2021-01-28: 200 mg via INTRAVENOUS
  Filled 2021-01-28: qty 10

## 2021-01-28 MED ORDER — SODIUM CHLORIDE 0.9 % IV SOLN: 200.0000 mg | Freq: Once | INTRAVENOUS | Status: DC

## 2021-01-28 MED ORDER — SODIUM CHLORIDE 0.9 % IV SOLN
Freq: Once | INTRAVENOUS | Status: AC
  Filled 2021-01-28: qty 250

## 2021-01-28 NOTE — Assessment & Plan Note (Addendum)
#  Iron deficiency Anemia symptomatic hemoglobin 8.3-chronic intermittent [SINCE 2010]; s/p IV Venofer x5.  Hemoglobin improved to 9.6.  #Recommend proceeding with Venofer today.  Counseled that patient's hemoglobin might take few more weeks to go up postinfusion.  # ETIOLOGY:  Discussed with patient/daughter that etiology of iron deficient unclear.  Discussed options of getting a CT scan versus endoscopies for further evaluation.  Given patient's tenuous cardiorespiratory status I think is reasonable to proceed with CT scan. [Unintentional weight loss.] Also discussed regarding a bone marrow biopsy with patient CT scan is unremarkable/or has been explanation for patient's anemia.  # DISPOSITION: # Venofer today # CT Ab/pelvis- ASAP # follow up- 2 months- NP; cbc/bmp/iron studies/ferritin-- venofer- Dr.B    

## 2021-01-28 NOTE — Progress Notes (Signed)
[place] NOTE  Patient Care Team: Tonia Ghent, MD as PCP - General Rockey Situ, Kathlene November, MD as Consulting Physician (Cardiology)  CHIEF COMPLAINTS/PURPOSE OF CONSULTATION: ANEMIA   HEMATOLOGY HISTORY:  # CHRONIC INTERMITTENT ANEMIA  [since 2010]- Hb SEP 2022- 8; previously on PO Iron- ; WBC/platelets- Normal; MCV- 90s. EGD- > 15 years; /Colonoscopy:4 years- [Dr.Jacob;; in [place]] capsule-? Bone marrow Biopsy-none; OCT 2022- SEVERE IRON DEFICIENCY; myeloma work-up/hemolysis work-up negative  #   AUG 2021- RIGHT LUNG stage I non-small cell lung cancer-  s/p SBRT   [Dr.Moody; [place]]; NOV 2022- CT chest-no recurrent disease.  # COPD on 2.5 lit/day [Dr.Sood]; diastolic CHF/ OCT 9628-ZMO BP [needing to come off losartan- Dr.Gollan]; peripheral neuropathy.  HISTORY OF PRESENTING ILLNESS: Alone; 2.5 Lit/ O2 24 x7. In wheel chair.  Daughter on the phone.  Alexandria [name] 85 y.o.  female with iron deficiency s/p venofer x 5 is here for a follow up.  Patient energy levels improved.  She is more up and about.  She is less short of breath with exertion.  She continues to deny any blood in stools or black-colored stools.  Patient continues to have issues with low blood pressure-which is currently being monitored by cardiology.  Review of Systems  Constitutional:  Positive for malaise/fatigue. Negative for chills, diaphoresis, fever and weight loss.  HENT:  Negative for nosebleeds and sore throat.   Eyes:  Negative for double vision.  Respiratory:  Positive for cough and shortness of breath. Negative for hemoptysis, sputum production and wheezing.   Cardiovascular:  Negative for chest pain, palpitations, orthopnea and leg swelling.  Gastrointestinal:  Negative for abdominal pain, blood in stool, constipation, diarrhea, heartburn, melena, nausea and vomiting.  Genitourinary:  Negative for dysuria, frequency and urgency.  Musculoskeletal:  Negative for back pain and  joint pain.  Skin: Negative.  Negative for itching and rash.  Neurological:  Positive for dizziness. Negative for tingling, focal weakness, weakness and headaches.  Endo/Heme/Allergies:  Does not bruise/bleed easily.  Psychiatric/Behavioral:  Negative for depression. The patient is not nervous/anxious and does not have insomnia.    MEDICAL HISTORY:  Past Medical History:  Diagnosis Date   Allergy, unspecified not elsewhere classified    Anxiety    Cataract    Chronic diastolic congestive heart failure (HCC)    COPD (chronic obstructive pulmonary disease) ([place]) 03/08/1998   PFTs 12/12/2002 FEV 1 1.42 (64%) ratio 58 with no better after B2 and DLCO75%; PFTs 11/20/09 FEV1 1.50 (73%) ratio 50 no better after B2 with DLCO 62%; Hfa 50% 11/20/2009 >75%, 01/13/10 p coaching   Depression 12/07/1998   Diabetes mellitus type II 02/05/2006   Dr. Cruzita Lederer with endo   Disorders of bursae and tendons in shoulder region, unspecified    Rotator cuff syndrome, right   E. coli bacteremia    Esophagitis    GERD (gastroesophageal reflux disease)    History of UTI    Hyperlipidemia 10/04/2000   Hypertension 03/08/1992   Hypothyroidism 03/08/1968   Iron deficiency anemia    Lung nodule    radiation starts 10-08-2019   Malignant neoplasm of right upper lobe of lung ([place]) 07/24/2019   Obesity    NOS   OSA (obstructive sleep apnea)    PSG 01/27/10 AHI 13, pt does not know CPAP settings   Peripheral neuropathy    Likely due to DM per Dr. Murvin Natal hernia     SURGICAL HISTORY: Past Surgical History:  Procedure  Laterality Date   ABD U/S  03/19/1999   Nml x2 foci in liver   ADENOSINE MYOVIEW  06/02/2007   Nml   CARDIOLITE PERSANTINE  08/24/2000   Nml   CAROTID U/S  08/24/2000   1-39% ICA stenosis   CAROTID U/S  06/02/2007   No apprec change    CARPAL TUNNEL RELEASE  12/1997   Right   CESAREAN SECTION     x2 [place]/ repeat   CHOLECYSTECTOMY  1997   COLONOSCOPY WITH PROPOFOL N/A 02/12/2016    Procedure: COLONOSCOPY WITH PROPOFOL;  Surgeon: Milus Banister, MD;  Location: [facility];  Service: Endoscopy;  Laterality: N/A;   CT ABD W & PELVIS WO/W CM     Abd hemangiomas of liver, 1 cm R renal cyst   DENTAL SURGERY  2016   Implants   DEXA  07/03/2003   Nml   ESOPHAGOGASTRODUODENOSCOPY  12/05/1997   Nml (due to hoarseness)   ESOPHAGOGASTRODUODENOSCOPY (EGD) WITH PROPOFOL N/A 02/12/2016   Procedure: ESOPHAGOGASTRODUODENOSCOPY (EGD) WITH PROPOFOL;  Surgeon: Milus Banister, MD;  Location: [facility];  Service: Endoscopy;  Laterality: N/A;   GALLBLADDER SURGERY     HERNIA REPAIR  01/24/2009   Lap Ventr w/ Lysis of adhesions (Dr. Donne Hazel)   knee arthroscopic surgery  years ago   right   ROTATOR CUFF REPAIR  1984   Right, [place]   SHOULDER OPEN ROTATOR CUFF REPAIR  02/08/2012   Procedure: ROTATOR CUFF REPAIR SHOULDER OPEN;  Surgeon: Magnus Sinning, MD;  Location: [facility];  Service: Orthopedics;  Laterality: Left;  Left Shoulder Open Anterior Acrominectomy Rotator Cuff Repair Open Distal Clavicle Resection ,tissue mend graft, and repair of biceps tendon   THUMB RELEASE  12/1997   Right   TONSILLECTOMY     TOTAL ABDOMINAL HYSTERECTOMY  1985   Due to dysmennorhea   US ECHOCARDIOGRAPHY  06/02/2007    SOCIAL HISTORY: Social History   Socioeconomic History   Marital status: Married    Spouse name: Not on file   Number of children: 2   Years of education: Not on file   Highest education level: Not on file  Occupational History   Occupation: Retired    Fish farm manager: RETIRED  Tobacco Use   Smoking status: Former    Packs/day: 2.00    Years: 50.00    Pack years: 100.00    Types: Cigarettes    Quit date: 02/05/2005    Years since quitting: 15.9   Smokeless tobacco: Never  Vaping Use   Vaping Use: Never used  Substance and Sexual Activity   Alcohol use: Yes    Comment: occasionally   Drug use: Never   Sexual activity: Not Currently  Other Topics Concern   Not on  file  Social History Narrative   Married with 2 children; Enjoys painting      Quit smoking in 2007; no alcohol; lives in [place] with husband; [name]/d lives in [place]. Had own business. Dont drive- sec to neuropathy.    Social Determinants of Health   Financial Resource Strain: Not on file  Food Insecurity: Not on file  Transportation Needs: Not on file  Physical Activity: Not on file  Stress: Not on file  Social Connections: Not on file  Intimate Partner Violence: Not on file    FAMILY HISTORY: Family History  Problem Relation Age of Onset   Heart disease Mother    Thyroid disease Mother    Emphysema Father  One lung   Cystic fibrosis Sister    Hyperthyroidism Sister    Osteoarthritis Brother    Hyperthyroidism Brother    Esophageal cancer Neg Hx    Cancer Neg Hx        Head or neck   Colon cancer Neg Hx    Stomach cancer Neg Hx    Breast cancer Neg Hx     ALLERGIES:  is allergic to antihistamines, diphenhydramine-type; sulfonamide derivatives; incruse ellipta [umeclidinium bromide]; clarithromycin; codeine; lyrica [pregabalin]; and spiriva handihaler [tiotropium bromide monohydrate].  MEDICATIONS:  Current Outpatient Medications  Medication Sig Dispense Refill   albuterol (PROAIR HFA) 108 (90 Base) MCG/ACT inhaler Inhale 2 puffs into the lungs every 6 (six) hours as needed for wheezing or shortness of breath. INHALE 2 PUFFS INTO THE LUNGS EVERY 6 HOURS AS NEEDED FOR WHEEZING OR SHORTNESS OF BREATH 3 Inhaler 3   busPIRone (BUSPAR) 15 MG tablet Take 15 mg by mouth 3 (three) times daily.     Cholecalciferol (VITAMIN D) 50 MCG (2000 UT) tablet Take 1 tablet (2,000 Units total) by mouth daily.     Coenzyme Q-10 200 MG CAPS Take 200 mg by mouth daily.     Cyanocobalamin (B-12) 5000 MCG CAPS Take 5,000 mcg by mouth daily.     esomeprazole (NEXIUM) 40 MG capsule Take 40 mg by mouth every other day.      ezetimibe (ZETIA) 10 MG tablet TAKE 1 TABLET BY MOUTH  EVERYDAY AT BEDTIME 90 tablet 1   fexofenadine (ALLEGRA) 180 MG tablet Take 180 mg by mouth as needed.      FLUoxetine (PROZAC) 10 MG tablet Take 20 mg by mouth daily.     fluticasone (FLONASE) 50 MCG/ACT nasal spray USE 2 SPRAYS INTO THE NOSE EVERY 12 HOURS AS NEEDED FOR STUFFY NOSE 48 mL 2   Fluticasone-Salmeterol (ADVAIR) 100-50 MCG/DOSE AEPB Inhale 1 puff into the lungs 2 (two) times daily. 60 each 5   furosemide (LASIX) 20 MG tablet TAKE 1 TABLET (20 MG TOTAL) BY MOUTH DAILY AS NEEDED FOR FLUID. 90 tablet 1   glucose blood (ONETOUCH VERIO) test strip Use as instructed to check sugar 2-3x time daily 200 each 5   Iron, Ferrous Sulfate, 325 (65 Fe) MG TABS Take 325 mg by mouth every other day. 90 tablet 3   metFORMIN (GLUCOPHAGE) 500 MG tablet Take 1 tablet (500 mg total) by mouth daily with supper. 90 tablet 3   ONETOUCH DELICA LANCETS FINE MISC USE TO CHECK SUGAR 1 TIME DAILY 100 each 5   ONETOUCH VERIO test strip USE AS INSTRUCTED TO CHECK SUGAR 1 TIME DAILY 100 strip 8   rosuvastatin (CRESTOR) 10 MG tablet TAKE 1 TABLET (10 MG TOTAL) BY MOUTH EVERY OTHER DAY. 45 tablet 2   Spacer/Aero Chamber Mouthpiece MISC Use with  inhaler as needed.  J44.9 1 each 0   SYNTHROID 150 MCG tablet TAKE 1 TABLET BY MOUTH DAILY BEFORE BREAKFAST. 90 tablet 2   cariprazine (VRAYLAR) 1.5 MG capsule Take 1 capsule (1.5 mg total) by mouth daily. Tapering dose from 55m to 1.542mthen stopping after 2.5 weeks. (Patient not taking: Reported on 12/16/2020) 17 capsule 0   EPINEPHrine (EPI-PEN) 0.3 mg/0.3 mL DEVI Inject 0.3 mg into the muscle once as needed (anaphylaxis). (Patient not taking: Reported on 12/26/2020)     nitroGLYCERIN (NITROSTAT) 0.4 MG SL tablet Place 1 tablet (0.4 mg total) under the tongue every 5 (five) minutes as needed for chest pain. (Patient not taking:  Reported on 12/16/2020) 25 tablet 3   potassium chloride (K-DUR) 10 MEQ tablet Take 1 tablet (10 mEq total) by mouth daily as needed (Takes with  furosemide doses.). (Patient not taking: No sig reported) 90 tablet 1   predniSONE (DELTASONE) 20 MG tablet 2 tabs po for 4 days, then 1 tab po for 4 days (Patient not taking: Reported on 12/16/2020) 12 tablet 0   No current facility-administered medications for this visit.      PHYSICAL EXAMINATION:   Vitals:   01/28/21 1312  BP: 133/65  Pulse: 77  Resp: 20  Temp: 98.7 F (37.1 C)   Filed Weights   01/28/21 1312  Weight: 212 lb 12.8 oz (96.5 kg)    Physical Exam Vitals and nursing note reviewed.  HENT:     Head: Normocephalic and atraumatic.     Mouth/Throat:     Pharynx: Oropharynx is clear.  Eyes:     Extraocular Movements: Extraocular movements intact.     Pupils: Pupils are equal, round, and reactive to light.  Cardiovascular:     Rate and Rhythm: Normal rate and regular rhythm.  Pulmonary:     Comments: Decreased breath sounds bilaterally.  Scattered wheezing. Abdominal:     Palpations: Abdomen is soft.  Musculoskeletal:        General: Normal range of motion.     Cervical back: Normal range of motion.  Skin:    General: Skin is warm.  Neurological:     General: No focal deficit present.     Mental Status: She is alert and oriented to person, place, and time.  Psychiatric:        Behavior: Behavior normal.        Judgment: Judgment normal.    LABORATORY DATA:  I have reviewed the data as listed Lab Results  Component Value Date   WBC 5.5 01/28/2021   HGB 9.6 (L) 01/28/2021   HCT 31.5 (L) 01/28/2021   MCV 100.3 (H) 01/28/2021   PLT 195 01/28/2021   Recent Labs    09/12/20 0952 12/16/20 1209 01/15/21 1143 01/28/21 1244  NA 143 140  --  141  K 4.3 4.5  --  4.6  CL 103 100  --  100  CO2 35* 33*  --  34*  GLUCOSE 157* 122*  --  119*  BUN 29* 24* 25* 22  CREATININE 0.98 0.74 0.74 0.79  CALCIUM 9.1 8.9  --  8.7*  GFRNONAA  --  >60 >60 >60  PROT 6.7 6.5  --   --   ALBUMIN 3.9 3.4*  --   --   AST 10 14*  --   --   ALT 8 12  --   --    ALKPHOS 87 74  --   --   BILITOT 0.3 0.2*  --   --      CT CHEST W CONTRAST  Result Date: 01/16/2021 CLINICAL DATA:  85 year old female with history of non-small cell lung cancer status post SB RT. Follow-up study. EXAM: CT CHEST WITH CONTRAST TECHNIQUE: Multidetector CT imaging of the chest was performed during intravenous contrast administration. CONTRAST:  52m OMNIPAQUE IOHEXOL 350 MG/ML SOLN COMPARISON:  Chest CT 07/03/2020. FINDINGS: Cardiovascular: Heart size is normal. There is no significant pericardial fluid, thickening or pericardial calcification. There is aortic atherosclerosis, as well as atherosclerosis of the great vessels of the mediastinum and the coronary arteries, including calcified atherosclerotic plaque in the left main, left anterior descending, left circumflex and right  coronary arteries. Severe calcifications of the mitral annulus. Mild thickening calcification of the aortic valve. Mediastinum/Nodes: No pathologically enlarged mediastinal or hilar lymph nodes. Esophagus is unremarkable in appearance. No axillary lymphadenopathy. Lungs/Pleura: There continues to be areas of ground-glass attenuation, septal thickening, nodular architectural distortion and end of all vein band like opacity in the right upper lobe near the apex at the site of treated pulmonary nodule. This appears to show progressive bandlike fibrosis compared to the prior study, without definitive evidence of local recurrence of disease (areas of nodularity appear stable compared to the prior study). A few other scattered 2-3 mm pulmonary nodules are noted in the lungs bilaterally, nonspecific, but stable compared to the prior study and statistically likely benign. A no other new suspicious appearing pulmonary nodules or masses are noted. No acute consolidative airspace disease. No pleural effusions. Upper Abdomen: Status post cholecystectomy. Aortic atherosclerosis. Exophytic peripherally enhancing lesion extending  from the posteromedial aspect of segment 2 of the liver (axial image 124 of series 2) measuring 3.8 x 2.9 cm, similar to numerous prior examinations, compatible with a cavernous hemangioma. Colonic diverticulosis. Musculoskeletal: Well-defined sclerotic lesion with narrow zone of transition in the lateral aspect of the left seventh rib, stable and not hypermetabolic on prior PET-CT, presumably a bone island. There are no other aggressive appearing lytic or blastic lesions noted in the visualized portions of the skeleton. IMPRESSION: 1. Evolving changes of postradiation fibrosis in the apex of the right upper lobe, as above, with no definitive findings to suggest locally recurrent disease or definite metastatic disease in the thorax. 2. Aortic atherosclerosis, in addition to left main and 3 vessel coronary artery disease. Please note that although the presence of coronary artery calcium documents the presence of coronary artery disease, the severity of this disease and any potential stenosis cannot be assessed on this non-gated CT examination. Assessment for potential risk factor modification, dietary therapy or pharmacologic therapy may be warranted, if clinically indicated. 3. There are calcifications of the aortic valve and mitral annulus. Echocardiographic correlation for evaluation of potential valvular dysfunction may be warranted if clinically indicated. Electronically Signed   By: Vinnie Langton M.D.   On: 01/16/2021 10:31    Symptomatic anemia  # Iron deficiency Anemia symptomatic hemoglobin 8.3-chronic intermittent [SINCE 2010]; s/p IV Venofer x5.  Hemoglobin improved to 9.6.  #Recommend proceeding with Venofer today.  Counseled that patient's hemoglobin might take few more weeks to go up postinfusion.  # ETIOLOGY:  Discussed with patient/daughter that etiology of iron deficient unclear.  Discussed options of getting a CT scan versus endoscopies for further evaluation.  Given patient's tenuous  cardiorespiratory status I think is reasonable to proceed with CT scan. [Unintentional weight loss.] Also discussed regarding a bone marrow biopsy with patient CT scan is unremarkable/or has been explanation for patient's anemia.  # DISPOSITION: # Venofer today # CT Ab/pelvis- ASAP # follow up- 2 months- NP; cbc/bmp/iron studies/ferritin-- venofer- Dr.B  All questions were answered. The patient knows to call the clinic with any problems, questions or concerns.    Cammie Sickle, MD 01/28/2021 2:38 PM

## 2021-01-28 NOTE — Patient Instructions (Signed)

## 2021-01-28 NOTE — Progress Notes (Signed)
Patient reports energy level has improved.  Would like to know if taking oral iron would be beneficial.

## 2021-02-03 ENCOUNTER — Other Ambulatory Visit: Payer: Self-pay

## 2021-02-03 ENCOUNTER — Encounter: Payer: Self-pay | Admitting: Pulmonary Disease

## 2021-02-03 ENCOUNTER — Ambulatory Visit (INDEPENDENT_AMBULATORY_CARE_PROVIDER_SITE_OTHER): Payer: BC Managed Care – PPO | Admitting: Pulmonary Disease

## 2021-02-03 VITALS — BP 126/82 | HR 82 | Temp 97.6°F | Ht 65.0 in | Wt 221.0 lb

## 2021-02-03 DIAGNOSIS — J449 Chronic obstructive pulmonary disease, unspecified: Secondary | ICD-10-CM

## 2021-02-03 DIAGNOSIS — J441 Chronic obstructive pulmonary disease with (acute) exacerbation: Secondary | ICD-10-CM

## 2021-02-03 DIAGNOSIS — J9611 Chronic respiratory failure with hypoxia: Secondary | ICD-10-CM | POA: Diagnosis not present

## 2021-02-03 MED ORDER — AMOXICILLIN-POT CLAVULANATE 875-125 MG PO TABS: 1.0000 | ORAL_TABLET | Freq: Two times a day (BID) | ORAL | 0 refills | Status: DC

## 2021-02-03 MED ORDER — PREDNISONE 10 MG PO TABS: ORAL_TABLET | ORAL | 0 refills | Status: AC

## 2021-02-03 MED ORDER — BUDESONIDE-FORMOTEROL FUMARATE 80-4.5 MCG/ACT IN AERO: 2.0000 | INHALATION_SPRAY | Freq: Two times a day (BID) | RESPIRATORY_TRACT | 12 refills | Status: DC

## 2021-02-03 NOTE — Patient Instructions (Signed)
Symbicort two puffs in the morning and two puffs in the evening, and rinse your mouth after each use  Will arrange for spacer device to use with symbicort  Prednisone 10 mg pill >> 3 pills daily for 2 days, 2 pills daily for 2 days, 1 pill daily for 2 days  Augmentin antibiotic 1 pill twice per day for 7 days  Follow up in 4 months

## 2021-02-03 NOTE — Progress Notes (Signed)
[facility]  Chief Complaint  Patient presents with   Follow-up    Patient says she's not breathing to good today.    Constitutional:  BP 126/82 (BP Location: Left Arm, Patient Position: Sitting, Cuff Size: Normal)   Pulse 82   Temp 97.6 F (36.4 C) (Oral)   Ht 5\' 5"  (1.651 m)   Wt 221 lb (100.2 kg)   LMP  (LMP Unknown)   SpO2 95%   BMI 36.78 kg/m   Past Medical History:  Depression, Hypothyroidism, HTN, Diastolic CHF, HLD, DM, GERD, Peripheral neuropathy  Past Surgical History:  She  has a past surgical history that includes Tonsillectomy; Rotator cuff repair (1984); Cesarean section; Total abdominal hysterectomy (1985); Cholecystectomy (1997); THUMB RELEASE (12/1997); Carpal tunnel release (12/1997); Esophagogastroduodenoscopy (12/05/1997); ABD U/S (03/19/1999); CT ABD W & PELVIS WO/W CM; CARDIOLITE PERSANTINE (08/24/2000); CAROTID U/S (08/24/2000); DEXA (07/03/2003); ADENOSINE MYOVIEW (06/02/2007); CAROTID U/S (06/02/2007); US ECHOCARDIOGRAPHY (06/02/2007); Hernia repair (01/24/2009); knee arthroscopic surgery (years ago); Shoulder open rotator cuff repair (02/08/2012); Gallbladder surgery; Dental surgery (2016); Colonoscopy with propofol (N/A, 02/12/2016); and Esophagogastroduodenoscopy (egd) with propofol (N/A, 02/12/2016).  Brief Summary:  Alexandria [name] is a 85 y.o. female female with dyspnea from COPD, diastolic CHF, deconditioning, and iron deficiency anemia.  She also has hx of OSA, but intolerant of CPAP.      Subjective:   Her daughter participate with the interview by phone.  She has been feeling more short of breath for the past 4 to 5 days.  She has cough with thick white sputum.  She has pain in her right ear and has been feeling dizzy.  She sometimes feels like the room is spinning.  She hasn't been using advair - caused throat irritation and more cough.  She is reluctant to use albuterol unless she really needs  it.  She is not having fever, hemoptysis, or chest pain.  Her CT chest from 01/16/21 showed post radiation changes to the right upper lobe.  Physical Exam:   Appearance - wearing oxygen  ENMT - no sinus tenderness, no oral exudate, no LAN, Mallampati 3 airway, no stridor, cerumen build up right ear  Respiratory - b/l expiratory wheezing  CV - s1s2 regular rate and rhythm, no murmurs  Ext - no clubbing, no edema  Skin - no rashes  Psych - normal mood and affect    Pulmonary testing:  RAST 10/02/10 >> negative, IgE 8.1 A1AT 04/15/11 >> MM PFT 10/512 >> FEV1 1.29 (61%), FEV1% 57, TLC 6.01 (119%), RV 3.52 (153%), DLCO 67%, no BD PFT 08/15/14 >> FEV1 1.07 (57%), FEV1% 55, TLC 6.49 (128%), DLCO 47%, no BD  Chest Imaging:  CT chest 06/29/16 >> atherosclerosis, RUL nodule 7 mm (was 8 mm), no change 6 mm LUL nodule, no change 4 mm RLL nodule  CT chest 12/23/17 >> 10 mm RUL nodule, 7 mm RUL nodule, 4 mm RLL nodule, 6 mm LUL nodule, centrilobular emphysema PET scan 01/06/18 >> RUL nodule not hypermetabolic CT chest 9/76/73 >> atherosclerosis, moderate centrilobular emphysema with diffuse bronchial wall thickening, 2.4 cm posterior apical irregular nodule increased from 2.3 cm, scar medial RLL, scattered nodules up to 4 mm in medial RLL, 6 mm nodule in LLL CT chest 12/14/18 >> no change RUL nodule CT chest 06/13/19 >> 2.2 x 1.2 cm RUL nodule (was 2.1 x 1.0 cm from October 2020), 0.4 cm nodule LLL CT chest 01/16/21 >> post XRT changes to RUL  Sleep Tests:  PSG 01/27/10 >> AHI 13, SpO2 low 73% ONO with CPAP 08/09/14 >> Test time 9 hr 16 min.  Mean SpO2 92.2%, low SpO2 86%. Spent 10 min with SpO2 < 88% ONO with CPAP 06/24/16 >> test time 7 hrs 29 min.  Average SpO2 90%, low SpO2 79%.  Spent 58 min with SpO2 < 88%. ONO with RA 11/01/16 >> test time 6 hrs 43 min.  Average SpO2 89%, low SpO2 67%.  Spent 2 hrs 7 min with SpO2 < 88%. ONO with 2 liters 07/18/18 >> test time 8 hrs 34 min.  Baseline SpO2  94%, low SpO2 69%.  Spent 38 min with SpO2 < 88%.  Cardiac Tests:  Echo 09/28/14 >> mild LVH, EF 55 to 60%  Social History:  She  reports that she quit smoking about 16 years ago. Her smoking use included cigarettes. She has a 100.00 pack-year smoking history. She has never used smokeless tobacco. She reports current alcohol use. She reports that she does not use drugs.  Family History:  Her family history includes Cystic fibrosis in her sister; Emphysema in her father; Heart disease in her mother; Hyperthyroidism in her brother and sister; Osteoarthritis in her brother; Thyroid disease in her mother.     Assessment/Plan:    COPD with emphysema and chronic bronchitis. - intolerant of LAMA's - Advair caused throat irritation and increased cough - will try symbicort again with spacer device - she has an acute exacerbation - will give her tapering course of prednisone and augmentin for 7 days  Dizziness, vertigo, and right ear pain. - prednisone and augmentin should help with this also - she has appointment with PCP later this week; might need her ear irrigated   Chronic respiratory failure with hypoxia. - goal SpO2 > 90% - 2 to 3 liters pulsed oxygen 24/7   Rt upper lung nodule. - presumptive diagnosis of lung cancer - completed empiric SBRT - followed by oncology  Allergic rhinitis. - prn allegra, flonase   Time Spent Involved in Patient Care on Day of Examination:  34 minutes  Follow up:   Patient Instructions  Symbicort two puffs in the morning and two puffs in the evening, and rinse your mouth after each use  Will arrange for spacer device to use with symbicort  Prednisone 10 mg pill >> 3 pills daily for 2 days, 2 pills daily for 2 days, 1 pill daily for 2 days  Augmentin antibiotic 1 pill twice per day for 7 days  Follow up in 4 months  Medication List:   Allergies as of 02/03/2021       Reactions   Antihistamines, Diphenhydramine-type Other (See  Comments)   Able to tolerate only allegra.    Sulfonamide Derivatives Swelling   REACTION: closed throat   Incruse Ellipta [umeclidinium Bromide] Cough   Caused voice change and severe coughing   Clarithromycin Other (See Comments)   REACTION: diff swallowing and mouth blisters   Codeine Other (See Comments)   Unknown    Lyrica [pregabalin] Other (See Comments)   Lack of effect for neuropathy pain.     Spiriva Handihaler [tiotropium Bromide Monohydrate] Other (See Comments)   Voice changes        Medication List        Accurate as of February 03, 2021  2:16 PM. If you have any questions, ask your nurse or doctor.          STOP taking these medications    cariprazine 1.5 MG  capsule Commonly known as: Dietitian Stopped by: Chesley Mires, MD   EPINEPHrine 0.3 mg/0.3 mL Devi Commonly known as: EPI-PEN Stopped by: Chesley Mires, MD   Fluticasone-Salmeterol 100-50 MCG/DOSE Aepb Commonly known as: ADVAIR Stopped by: Chesley Mires, MD       TAKE these medications    albuterol 108 (90 Base) MCG/ACT inhaler Commonly known as: ProAir HFA Inhale 2 puffs into the lungs every 6 (six) hours as needed for wheezing or shortness of breath. INHALE 2 PUFFS INTO THE LUNGS EVERY 6 HOURS AS NEEDED FOR WHEEZING OR SHORTNESS OF BREATH   amoxicillin-clavulanate 875-125 MG tablet Commonly known as: AUGMENTIN Take 1 tablet by mouth 2 (two) times daily. Started by: Chesley Mires, MD   B-12 5000 MCG Caps Take 5,000 mcg by mouth daily.   budesonide-formoterol 80-4.5 MCG/ACT inhaler Commonly known as: Symbicort Inhale 2 puffs into the lungs 2 (two) times daily. Started by: Chesley Mires, MD   busPIRone 15 MG tablet Commonly known as: BUSPAR Take 15 mg by mouth 2 (two) times daily.   Coenzyme Q-10 200 MG Caps Take 200 mg by mouth daily.   esomeprazole 40 MG capsule Commonly known as: NEXIUM Take 40 mg by mouth every other day.   ezetimibe 10 MG tablet Commonly known as: ZETIA TAKE 1  TABLET BY MOUTH EVERYDAY AT BEDTIME   fexofenadine 180 MG tablet Commonly known as: ALLEGRA Take 180 mg by mouth as needed.   FLUoxetine 10 MG tablet Commonly known as: PROZAC Take 20 mg by mouth daily.   fluticasone 50 MCG/ACT nasal spray Commonly known as: FLONASE USE 2 SPRAYS INTO THE NOSE EVERY 12 HOURS AS NEEDED FOR STUFFY NOSE   furosemide 20 MG tablet Commonly known as: LASIX TAKE 1 TABLET (20 MG TOTAL) BY MOUTH DAILY AS NEEDED FOR FLUID.   glucose blood test strip Commonly known as: OneTouch Verio Use as instructed to check sugar 2-3x time daily   OneTouch Verio test strip Generic drug: glucose blood USE AS INSTRUCTED TO CHECK SUGAR 1 TIME DAILY   Iron (Ferrous Sulfate) 325 (65 Fe) MG Tabs Take 325 mg by mouth every other day.   metFORMIN 500 MG tablet Commonly known as: GLUCOPHAGE Take 1 tablet (500 mg total) by mouth daily with supper.   nitroGLYCERIN 0.4 MG SL tablet Commonly known as: NITROSTAT Place 1 tablet (0.4 mg total) under the tongue every 5 (five) minutes as needed for chest pain.   OneTouch Delica Lancets Fine Misc USE TO CHECK SUGAR 1 TIME DAILY   potassium chloride 10 MEQ tablet Commonly known as: KLOR-CON Take 1 tablet (10 mEq total) by mouth daily as needed (Takes with furosemide doses.).   predniSONE 10 MG tablet Commonly known as: DELTASONE Take 3 tablets (30 mg total) by mouth daily with breakfast for 2 days, THEN 2 tablets (20 mg total) daily with breakfast for 2 days, THEN 1 tablet (10 mg total) daily with breakfast for 2 days. Start taking on: February 03, 2021 What changed:  medication strength See the new instructions. Changed by: Chesley Mires, MD   rosuvastatin 10 MG tablet Commonly known as: CRESTOR TAKE 1 TABLET (10 MG TOTAL) BY MOUTH EVERY OTHER DAY.   Spacer/Aero Chamber [employer] Use with  inhaler as needed.  J44.9   Synthroid 150 MCG tablet Generic drug: levothyroxine TAKE 1 TABLET BY MOUTH DAILY BEFORE  BREAKFAST.   Vitamin D 50 MCG (2000 UT) tablet Take 1 tablet (2,000 Units total) by mouth daily.  Signature:  Chesley Mires, MD [place] Pager - [phone] 02/03/2021, 2:16 PM

## 2021-02-06 ENCOUNTER — Other Ambulatory Visit: Payer: Self-pay

## 2021-02-06 ENCOUNTER — Encounter: Payer: Self-pay | Admitting: Family Medicine

## 2021-02-06 ENCOUNTER — Ambulatory Visit (INDEPENDENT_AMBULATORY_CARE_PROVIDER_SITE_OTHER): Payer: BC Managed Care – PPO | Admitting: Family Medicine

## 2021-02-06 DIAGNOSIS — R42 Dizziness and giddiness: Secondary | ICD-10-CM

## 2021-02-06 DIAGNOSIS — J441 Chronic obstructive pulmonary disease with (acute) exacerbation: Secondary | ICD-10-CM

## 2021-02-06 NOTE — Progress Notes (Signed)
This visit occurred during the SARS-CoV-2 public health emergency.  Safety protocols were in place, including screening questions prior to the visit, additional usage of staff PPE, and extensive cleaning of exam room while observing appropriate contact time as indicated for disinfecting solutions.  Rare use of lasix, not weekly, maybe 2 times a month.  She is off valsartan.  She has had iron infusions for IDA prev.  This per outside clinic.  I will defer.  R ear pain, started a few weeks ago.  On prednisone and augmentin currently.  She notes that her balance is off with standing and with sitting/turning her head.  She does not have symptoms with slow head movement or with eye tracking (in the absence of head movement).  Previous pulmonary note discussed with patient.  She clearly feels better off Vraylar.  Intolerance list updated.  Meds, vitals, and allergies reviewed.   ROS: Per HPI unless specifically indicated in ROS section   Nad [place] Neck supple, no LA Scant almost no B wheeze.  CTAB o/w.  Rrr No sx with eye tracking or slow head movement.   TM wnl B CN 2-12 wnl B, S/S wnl x4

## 2021-02-06 NOTE — Patient Instructions (Signed)
Finish the antibiotics and prednisone.  Use the home vertigo exercise for likely BPV and update me as needed.  Take care.  Glad to see you.

## 2021-02-08 NOTE — Assessment & Plan Note (Signed)
Would finish Augmentin and prednisone.  She has scant almost no wheeze.  Staff message sent to Dr. Halford Chessman as Juluis Rainier.  She does appear to be improved.  She can update [place] or pulmonary as needed.

## 2021-02-08 NOTE — Assessment & Plan Note (Addendum)
She reports recurrent episodic clearly reproducible symptoms that can be triggered by head rotation.  No symptoms with eye tracking.  Likely BPV.  Discussed anatomy and options.  Reasonable to try home exercises, explained and demonstrated for patient, picture instructions given to patient.  She agrees with plan.  She will update [place] as needed.  It does not appear that her symptoms are related to hypotension.

## 2021-02-11 ENCOUNTER — Other Ambulatory Visit: Payer: Self-pay

## 2021-02-11 ENCOUNTER — Encounter: Payer: Self-pay | Admitting: Internal Medicine

## 2021-02-11 ENCOUNTER — Encounter: Payer: Self-pay | Admitting: Pulmonary Disease

## 2021-02-11 ENCOUNTER — Encounter: Payer: Self-pay | Admitting: Family Medicine

## 2021-02-11 ENCOUNTER — Ambulatory Visit (INDEPENDENT_AMBULATORY_CARE_PROVIDER_SITE_OTHER): Payer: BC Managed Care – PPO

## 2021-02-11 DIAGNOSIS — I959 Hypotension, unspecified: Secondary | ICD-10-CM

## 2021-02-11 DIAGNOSIS — R0602 Shortness of breath: Secondary | ICD-10-CM

## 2021-02-11 DIAGNOSIS — R079 Chest pain, unspecified: Secondary | ICD-10-CM

## 2021-02-11 LAB — ECHOCARDIOGRAM COMPLETE
AR max vel: 1.71 cm2
AV Area VTI: 1.79 cm2
AV Area mean vel: 1.76 cm2
AV Mean grad: 11 mmHg
AV Peak grad: 22.1 mmHg
Ao pk vel: 2.35 m/s
Area-P 1/2: 3.33 cm2

## 2021-02-11 MED ORDER — PERFLUTREN LIPID MICROSPHERE
1.0000 mL | INTRAVENOUS | Status: AC | PRN
  Administered 2021-02-11: 2 mL via INTRAVENOUS

## 2021-02-12 ENCOUNTER — Telehealth: Payer: Self-pay | Admitting: Pulmonary Disease

## 2021-02-12 MED ORDER — PREDNISONE 5 MG PO TABS: ORAL_TABLET | ORAL | 0 refills | Status: AC

## 2021-02-12 MED ORDER — AMOXICILLIN-POT CLAVULANATE 875-125 MG PO TABS: 1.0000 | ORAL_TABLET | Freq: Two times a day (BID) | ORAL | 0 refills | Status: DC

## 2021-02-12 NOTE — Telephone Encounter (Signed)
Chesley Mires, MD     10:39 AM Note Can give extend course of prednisone.   Please send script for prednisone 5 mg pill >> 4 pills daily for 3 days, 3 pills daily for 3 days, 2 pill daily for 3 days, 1 pill daily for 3 days.      Called and spoke with Lavella Lemons letting her know the info per Dr. Halford Chessman an verbalized understanding. Lavella Lemons wanted to know if another round of abx could be sent in for pt as well as the prednisone. Dr. Halford Chessman, please advise.

## 2021-02-12 NOTE — Telephone Encounter (Signed)
Open phone encounter from where Alexandria [name] called the office. Please refer to that encounter.

## 2021-02-12 NOTE — Telephone Encounter (Signed)
Dr. Halford Chessman please advise on the following My Chart message:   Hello Dr. Halford Chessman and [name]   This is Alexandria [name], Ema's daughter.    [name] has finished the steroid and antibiotic that Dr Halford Chessman prescribed. Immediately upon starting the medications, [name] felt better and her cough dissipated. [name] began the meds on Tuesday, 11.29 and was feeling much better until Monday, 12.5. Dr. [name] saw [name] on 12.2 for a different issue and Dr. [name] said [name]'s lungs sounded clear from infection. On 12.5, [name] got a very sore throat and a "froggy" voice; in addition, she did not feel well. Today, 12.7, a cough has returned although her voice is marginally better, and she still doesn't feel well.    Please let us know what to do to help [name]. Is there a second round of prescription(s) that might help?   Can someone let me know the next best step please?   Alexandria [name] [phone]  Thank you

## 2021-02-12 NOTE — Telephone Encounter (Signed)
Can give extend course of prednisone.  Please send script for prednisone 5 mg pill >> 4 pills daily for 3 days, 3 pills daily for 3 days, 2 pill daily for 3 days, 1 pill daily for 3 days.

## 2021-02-12 NOTE — Telephone Encounter (Signed)
Can send another 7 days of augmentin 875 bid.

## 2021-02-12 NOTE — Telephone Encounter (Signed)
Called and spoke to pt's daughter, Lavella Lemons (on [place]). Informed her of the recs per Dr. Halford Chessman. Rx sent to preferred pharmacy. Pt verbalized understanding and denied any further questions or concerns at this time.

## 2021-02-17 ENCOUNTER — Telehealth: Payer: Self-pay

## 2021-02-17 NOTE — Telephone Encounter (Signed)
Able to reach pt regarding her recent ECHO Dr. Rockey Situ had a chance to review her results and advised   "Echo  Normal ejection fraction, diastolic relaxation issues  Calcification around mitral valve "  Alexandria [name] very thankful for the phone call of her results, all questions and concerns were address with nothing further at this time. Will see at next schedule f/u appt.   Alexandria [name] requested we call her daughter to inform her of the results as well, was able to reach out to pt's daughter, Alexandria [name], results reviewed with her as well.

## 2021-02-18 ENCOUNTER — Other Ambulatory Visit: Payer: Self-pay | Admitting: Cardiovascular Disease

## 2021-02-18 ENCOUNTER — Telehealth: Payer: Self-pay | Admitting: *Deleted

## 2021-02-18 NOTE — Telephone Encounter (Signed)
While on the telephone with patient's husband he stated that his wife has the same symptoms that he has. Patient's husband stated that the both of them did a covid test and they were positive. Patient's husband stated that his wife stated with symptoms several weeks back and has seen her pulmonologist and was given medication. Patient's husband stated that she has also seen Dr. [name]. Patient stated that she is feeling better. Patient stated that she has COPD and has difficulty breathing at times but not any worse than usual. Patient was offered a virtual visit which they declined stating that she is doing better and feels that she is over the worse of it. Patient's husband was advised if his wife does not continue to improve to call the office back. Patient's husband was given ER precautions and he verbalized understanding.

## 2021-02-18 NOTE — Telephone Encounter (Signed)
Noted. Thanks. I'll defer to patient.

## 2021-02-20 ENCOUNTER — Other Ambulatory Visit: Payer: BC Managed Care – PPO

## 2021-02-20 ENCOUNTER — Ambulatory Visit: Admission: RE | Admit: 2021-02-20 | Payer: Medicare Other | Source: Ambulatory Visit

## 2021-02-24 ENCOUNTER — Other Ambulatory Visit: Payer: Self-pay

## 2021-02-24 ENCOUNTER — Ambulatory Visit (INDEPENDENT_AMBULATORY_CARE_PROVIDER_SITE_OTHER): Payer: BC Managed Care – PPO | Admitting: Cardiovascular Disease

## 2021-02-24 ENCOUNTER — Encounter: Payer: Self-pay | Admitting: Cardiovascular Disease

## 2021-02-24 VITALS — Ht 65.0 in

## 2021-02-24 DIAGNOSIS — I739 Peripheral vascular disease, unspecified: Secondary | ICD-10-CM

## 2021-02-24 DIAGNOSIS — G903 Multi-system degeneration of the autonomic nervous system: Secondary | ICD-10-CM | POA: Diagnosis not present

## 2021-02-24 DIAGNOSIS — I25118 Atherosclerotic heart disease of native coronary artery with other forms of angina pectoris: Secondary | ICD-10-CM | POA: Diagnosis not present

## 2021-02-24 MED ORDER — MIDODRINE HCL 5 MG PO TABS: 5.0000 mg | ORAL_TABLET | Freq: Three times a day (TID) | ORAL | 1 refills | Status: DC | PRN

## 2021-02-24 NOTE — Progress Notes (Signed)
Virtual Visit via Telephone Note   This visit type was conducted due to national recommendations for restrictions regarding the COVID-19 Pandemic (e.g. social distancing) in an effort to limit this patient's exposure and mitigate transmission in our community.  Due to her co-morbid illnesses, this patient is at least at moderate risk for complications without adequate follow up.  This format is felt to be most appropriate for this patient at this time.  The patient did not have access to video technology/had technical difficulties with video requiring transitioning to audio format only (telephone).  All issues noted in this document were discussed and addressed.  No physical exam could be performed with this format.  Please refer to the patient's chart for her  consent to telehealth for [facility].   I connected with  Alexandria [name] on 02/24/21 by a video enabled telemedicine application and verified that I am speaking with the correct person using two identifiers. I am contacting the patient above from our cardiology clinic office or alternate office work station to their home, I discussed the limitations of evaluation and management by telemedicine. The patient expressed understanding and agreed to proceed.   Evaluation Performed:  Follow-up visit  Date:  02/24/2021   ID:  Alexandria [name], DOB [birth year], MRN [id number]  Patient Location:  [place] [address]   Provider location:   [facility], [place] office  PCP:  Tonia Ghent, MD  Cardiologist:  Arvid Right [facility]  Chief Complaint  Patient presents with   Follow-up    Follow up to review echo results. Medications verbally reviewed with patient and daughter [name]. Patient was not able to get any vital signs today.      History of Present Illness:    Alexandria [name] is a 85 y.o. female who presents via audio/video conferencing for a telehealth visit today.   The patient does  not symptoms concerning for COVID-19 infection (fever, chills, cough, or new SHORTNESS OF BREATH).   obesity,   long history of smoking for 50 years, COPD, quit 15 years DM2, dx in 2006, non-insulin-dependent, controlled, with  mild CKD hyperlipidemia,  hypertension,   2012  chest pain, stress test at that time with no ischemia, normal EF  July 2016 with COPD exacerbation, sepsis. sleep apnea, not on CPAP, seen by Dr. Llana Aliment, mask does not fit Chronic SOB, normal echo 09/2014 Anxiety Diagnosis of lung cancer,  completed radiation , planned chemo She presents for follow-up of her shortness of breath symptoms  On last clinic visit 10/22, BP low, Valsartan held She has had several office visits, stable blood pressure 1 teens up to 120s  Discussed with daughter who was on the phone today as well as Ms. Passon She continues to have periodic low blood pressure ranging from the 38L to 373 systolic Does not seem to be particularly improved with fluid intake, salt intake  Having some problems with inner ear per notes from primary care   Echocardiogram February 11, 2021  1. Left ventricular ejection fraction, by estimation, is 55 to 60%. The  left ventricle has normal function. The left ventricle has no regional  wall motion abnormalities. Left ventricular diastolic parameters are  consistent with Grade II diastolic  dysfunction (pseudonormalization).   2. Right ventricular systolic function is normal. The right ventricular  size is normal.   3. Left atrial size was mild to moderately dilated.   4. The mitral valve is degenerative.  No evidence of mitral valve  regurgitation. Moderate to severe mitral annular calcification.   5. The aortic valve was not well visualized. Aortic valve regurgitation  is not visualized.   6. The inferior vena cava is normal in size with <50% respiratory  variability, suggesting right atrial pressure of 8 mmHg.     Past Medical History:  Diagnosis Date    Allergy, unspecified not elsewhere classified    Anxiety    Cataract    Chronic diastolic congestive heart failure (HCC)    COPD (chronic obstructive pulmonary disease) ([place]) 03/08/1998   PFTs 12/12/2002 FEV 1 1.42 (64%) ratio 58 with no better after B2 and DLCO75%; PFTs 11/20/09 FEV1 1.50 (73%) ratio 50 no better after B2 with DLCO 62%; Hfa 50% 11/20/2009 >75%, 01/13/10 p coaching   Depression 12/07/1998   Diabetes mellitus type II 02/05/2006   Dr. Cruzita Lederer with endo   Disorders of bursae and tendons in shoulder region, unspecified    Rotator cuff syndrome, right   E. coli bacteremia    Esophagitis    GERD (gastroesophageal reflux disease)    History of UTI    Hyperlipidemia 10/04/2000   Hypertension 03/08/1992   Hypothyroidism 03/08/1968   Iron deficiency anemia    Lung nodule    radiation starts 10-08-2019   Malignant neoplasm of right upper lobe of lung ([place]) 07/24/2019   Obesity    NOS   OSA (obstructive sleep apnea)    PSG 01/27/10 AHI 13, pt does not know CPAP settings   Peripheral neuropathy    Likely due to DM per Dr. Murvin Natal hernia    Past Surgical History:  Procedure Laterality Date   ABD U/S  03/19/1999   Nml x2 foci in liver   ADENOSINE MYOVIEW  06/02/2007   Nml   CARDIOLITE PERSANTINE  08/24/2000   Nml   CAROTID U/S  08/24/2000   1-39% ICA stenosis   CAROTID U/S  06/02/2007   No apprec change    CARPAL TUNNEL RELEASE  12/1997   Right   CESAREAN SECTION     x2 [place]/ repeat   CHOLECYSTECTOMY  1997   COLONOSCOPY WITH PROPOFOL N/A 02/12/2016   Procedure: COLONOSCOPY WITH PROPOFOL;  Surgeon: Milus Banister, MD;  Location: [facility];  Service: Endoscopy;  Laterality: N/A;   CT ABD W & PELVIS WO/W CM     Abd hemangiomas of liver, 1 cm R renal cyst   DENTAL SURGERY  2016   Implants   DEXA  07/03/2003   Nml   ESOPHAGOGASTRODUODENOSCOPY  12/05/1997   Nml (due to hoarseness)   ESOPHAGOGASTRODUODENOSCOPY (EGD) WITH PROPOFOL N/A 02/12/2016   Procedure:  ESOPHAGOGASTRODUODENOSCOPY (EGD) WITH PROPOFOL;  Surgeon: Milus Banister, MD;  Location: [facility];  Service: Endoscopy;  Laterality: N/A;   GALLBLADDER SURGERY     HERNIA REPAIR  01/24/2009   Lap Ventr w/ Lysis of adhesions (Dr. Donne Hazel)   knee arthroscopic surgery  years ago   right   ROTATOR CUFF REPAIR  1984   Right, [place]   SHOULDER OPEN ROTATOR CUFF REPAIR  02/08/2012   Procedure: ROTATOR CUFF REPAIR SHOULDER OPEN;  Surgeon: Magnus Sinning, MD;  Location: [facility];  Service: Orthopedics;  Laterality: Left;  Left Shoulder Open Anterior Acrominectomy Rotator Cuff Repair Open Distal Clavicle Resection ,tissue mend graft, and repair of biceps tendon   THUMB RELEASE  12/1997   Right   TONSILLECTOMY     TOTAL ABDOMINAL HYSTERECTOMY  1985  Due to dysmennorhea   US ECHOCARDIOGRAPHY  06/02/2007      Allergies:   Antihistamines, diphenhydramine-type; Sulfonamide derivatives; Incruse ellipta [umeclidinium bromide]; Clarithromycin; Codeine; Lyrica [pregabalin]; Spiriva handihaler [tiotropium bromide monohydrate]; and Vraylar [cariprazine]   Social History   Tobacco Use   Smoking status: Former    Packs/day: 2.00    Years: 50.00    Pack years: 100.00    Types: Cigarettes    Quit date: 02/05/2005    Years since quitting: 16.0   Smokeless tobacco: Never  Vaping Use   Vaping Use: Never used  Substance Use Topics   Alcohol use: Yes    Comment: occasionally   Drug use: Never     Current Outpatient Medications on File Prior to Visit  Medication Sig Dispense Refill   albuterol (PROAIR HFA) 108 (90 Base) MCG/ACT inhaler Inhale 2 puffs into the lungs every 6 (six) hours as needed for wheezing or shortness of breath. INHALE 2 PUFFS INTO THE LUNGS EVERY 6 HOURS AS NEEDED FOR WHEEZING OR SHORTNESS OF BREATH 3 Inhaler 3   amoxicillin-clavulanate (AUGMENTIN) 875-125 MG tablet Take 1 tablet by mouth 2 (two) times daily. 14 tablet 0   budesonide-formoterol (SYMBICORT) 80-4.5 MCG/ACT  inhaler Inhale 2 puffs into the lungs 2 (two) times daily. 1 each 12   busPIRone (BUSPAR) 15 MG tablet Take 15 mg by mouth 2 (two) times daily.     Cholecalciferol (VITAMIN D) 50 MCG (2000 UT) tablet Take 1 tablet (2,000 Units total) by mouth daily.     Coenzyme Q-10 200 MG CAPS Take 200 mg by mouth daily.     Cyanocobalamin (B-12) 5000 MCG CAPS Take 5,000 mcg by mouth daily.     esomeprazole (NEXIUM) 40 MG capsule Take 40 mg by mouth every other day.      ezetimibe (ZETIA) 10 MG tablet TAKE 1 TABLET BY MOUTH EVERYDAY AT BEDTIME 90 tablet 1   fexofenadine (ALLEGRA) 180 MG tablet Take 180 mg by mouth as needed.      FLUoxetine (PROZAC) 20 MG tablet Take 20 mg by mouth 2 (two) times daily.     fluticasone (FLONASE) 50 MCG/ACT nasal spray USE 2 SPRAYS INTO THE NOSE EVERY 12 HOURS AS NEEDED FOR STUFFY NOSE 48 mL 2   furosemide (LASIX) 20 MG tablet TAKE 1 TABLET (20 MG TOTAL) BY MOUTH DAILY AS NEEDED FOR FLUID. 90 tablet 1   glucose blood (ONETOUCH VERIO) test strip Use as instructed to check sugar 2-3x time daily 200 each 5   Iron, Ferrous Sulfate, 325 (65 Fe) MG TABS Take 325 mg by mouth every other day. 90 tablet 3   metFORMIN (GLUCOPHAGE) 500 MG tablet Take 1 tablet (500 mg total) by mouth daily with supper. 90 tablet 3   nitroGLYCERIN (NITROSTAT) 0.4 MG SL tablet Place 1 tablet (0.4 mg total) under the tongue every 5 (five) minutes as needed for chest pain. 25 tablet 3   ONETOUCH DELICA LANCETS FINE MISC USE TO CHECK SUGAR 1 TIME DAILY 100 each 5   ONETOUCH VERIO test strip USE AS INSTRUCTED TO CHECK SUGAR 1 TIME DAILY 100 strip 8   predniSONE (DELTASONE) 5 MG tablet Take 4 tablets (20 mg total) by mouth daily with breakfast for 3 days, THEN 3 tablets (15 mg total) daily with breakfast for 3 days, THEN 2 tablets (10 mg total) daily with breakfast for 3 days, THEN 1 tablet (5 mg total) daily with breakfast for 3 days. 30 tablet 0   rosuvastatin (CRESTOR)  10 MG tablet TAKE 1 TABLET (10 MG TOTAL) BY  MOUTH EVERY OTHER DAY. 45 tablet 2   Spacer/Aero Chamber Mouthpiece MISC Use with  inhaler as needed.  J44.9 1 each 0   SYNTHROID 150 MCG tablet TAKE 1 TABLET BY MOUTH DAILY BEFORE BREAKFAST. 90 tablet 2   No current facility-administered medications on file prior to visit.     Family Hx: The patient's family history includes Cystic fibrosis in her sister; Emphysema in her father; Heart disease in her mother; Hyperthyroidism in her brother and sister; Osteoarthritis in her brother; Thyroid disease in her mother. There is no history of Esophageal cancer, Cancer, Colon cancer, Stomach cancer, or Breast cancer.  ROS:   Please see the history of present illness.    Review of Systems  Constitutional: Negative.   HENT: Negative.    Respiratory: Negative.    Cardiovascular: Negative.   Gastrointestinal: Negative.   Musculoskeletal: Negative.   Neurological: Negative.   Psychiatric/Behavioral: Negative.    All other systems reviewed and are negative.    Labs/Other Tests and Data Reviewed:    Recent Labs: 11/21/2020: TSH 0.73 12/16/2020: ALT 12 01/28/2021: BUN 22; Creatinine, Ser 0.79; Hemoglobin 9.6; Platelets 195; Potassium 4.6; Sodium 141   Recent Lipid Panel Lab Results  Component Value Date/Time   CHOL 148 06/26/2019 08:53 AM   TRIG 93.0 06/26/2019 08:53 AM   HDL 60.00 06/26/2019 08:53 AM   CHOLHDL 2 06/26/2019 08:53 AM   LDLCALC 69 06/26/2019 08:53 AM   LDLDIRECT 63.0 07/29/2015 01:08 PM    Wt Readings from Last 3 Encounters:  02/06/21 220 lb (99.8 kg)  02/03/21 221 lb (100.2 kg)  01/28/21 212 lb 12.8 oz (96.5 kg)     Exam:    Vital Signs: Vital signs may also be detailed in the HPI Ht 5\' 5"  (1.651 m)    LMP  (LMP Unknown)    BMI 36.61 kg/m   Wt Readings from Last 3 Encounters:  02/06/21 220 lb (99.8 kg)  02/03/21 221 lb (100.2 kg)  01/28/21 212 lb 12.8 oz (96.5 kg)   Temp Readings from Last 3 Encounters:  02/06/21 97.7 F (36.5 C) (Temporal)  02/03/21 97.6 F  (36.4 C) (Oral)  01/28/21 98.7 F (37.1 C)   BP Readings from Last 3 Encounters:  02/06/21 118/64  02/03/21 126/82  01/28/21 (!) 153/47   Pulse Readings from Last 3 Encounters:  02/06/21 70  02/03/21 82  01/28/21 78     Well nourished, well developed female in no acute distress. Constitutional:  oriented to person, place, and time. No distress.    ASSESSMENT & PLAN:    Problem List Items Addressed This Visit       Cardiology Problems   PAD (peripheral artery disease) (HCC) - Primary   Relevant Medications   midodrine (PROAMATINE) 5 MG tablet   CAD (coronary artery disease)   Relevant Medications   midodrine (PROAMATINE) 5 MG tablet   Other Visit Diagnoses     Neurogenic orthostatic hypotension (HCC)       Relevant Medications   midodrine (PROAMATINE) 5 MG tablet      Hypotension Blood pressure low on prior clinic visit, valsartan held Daughter reports that she continues to have hypotension, systolic pressures 75-643 Etiology unclear Recommended she increase her fluid intake in the mornings, liberalize salt intake If she continues to have low blood pressure would recommend she try midodrine 5 mg as needed for systolic pressure 329 or less Discussed with patient and  daughter Medication will typically last 4 to 6 hours Recommend she sit down and take midodrine for orthostasis symptoms   COVID-19 Education: The signs and symptoms of COVID-19 were discussed with the patient and how to seek care for testing (follow up with PCP or arrange E-visit).  The importance of social distancing was discussed today.  Patient Risk:   After full review of this patients clinical status, I feel that they are at least moderate risk at this time.  Time:   Today, I have spent 25 minutes with the patient with telehealth technology discussing the cardiac and medical problems/diagnoses detailed above   Additional 10 min spent reviewing the chart prior to patient visit  today   Medication Adjustments/Labs and Tests Ordered: Current medicines are reviewed at length with the patient today.  Concerns regarding medicines are outlined above.   Tests Ordered: No tests ordered   Medication Changes: No changes made     Signed, Ida Rogue, MD  [place] [facility] [address] [place] [address], [place], [place] [address]

## 2021-02-24 NOTE — Patient Instructions (Addendum)
Medication Instructions:  Midodrine 5 mg 3 times daily as needed for systolic pressure 793 or less  If you need a refill on your cardiac medications before your next appointment, please call your pharmacy.    Lab work: No new labs needed   If you have labs (blood work) drawn today and your tests are completely normal, you will receive your results only by: [place] (if you have MyChart) OR A paper copy in the mail If you have any lab test that is abnormal or we need to change your treatment, we will call you to review the results.   Testing/Procedures: No new testing needed   Follow-Up: At [facility], you and your health needs are our priority.  As part of our continuing mission to provide you with exceptional heart care, we have created designated Provider Care Teams.  These Care Teams include your primary Cardiologist (physician) and Advanced Practice Providers (APPs -  Physician Assistants and Nurse Practitioners) who all work together to provide you with the care you need, when you need it.  You will need a follow up appointment in 3 months  Providers on your designated Care Team:   Murray Hodgkins, NP Christell Faith, PA-C Cadence Kathlen Mody, PA-C  Any Other Special Instructions Will Be Listed Below (If Applicable).  COVID-19 Vaccine Information can be found at: [email or web address] For questions related to vaccine distribution or appointments, please email [email or web address] or call [phone].

## 2021-03-17 ENCOUNTER — Ambulatory Visit
Admission: RE | Admit: 2021-03-17 | Discharge: 2021-03-17 | Disposition: A | Discharge status: Home / Self Care | Payer: Medicare Other | Source: Ambulatory Visit | Attending: Internal Medicine | Admitting: Internal Medicine

## 2021-03-17 ENCOUNTER — Other Ambulatory Visit: Payer: Self-pay

## 2021-03-17 DIAGNOSIS — K5730 Diverticulosis of large intestine without perforation or abscess without bleeding: Secondary | ICD-10-CM | POA: Diagnosis not present

## 2021-03-17 DIAGNOSIS — R634 Abnormal weight loss: Secondary | ICD-10-CM | POA: Diagnosis not present

## 2021-03-17 DIAGNOSIS — K6389 Other specified diseases of intestine: Secondary | ICD-10-CM | POA: Diagnosis not present

## 2021-03-17 DIAGNOSIS — D649 Anemia, unspecified: Secondary | ICD-10-CM

## 2021-03-17 LAB — POCT I-STAT CREATININE: Creatinine, Ser: 0.8 mg/dL (ref 0.44–1.00)

## 2021-03-17 MED ORDER — IOHEXOL 300 MG/ML  SOLN
100.0000 mL | Freq: Once | INTRAMUSCULAR | Status: AC | PRN
  Administered 2021-03-17: 100 mL via INTRAVENOUS

## 2021-03-19 ENCOUNTER — Other Ambulatory Visit: Payer: Self-pay | Admitting: *Deleted

## 2021-03-19 DIAGNOSIS — D649 Anemia, unspecified: Secondary | ICD-10-CM

## 2021-03-20 ENCOUNTER — Other Ambulatory Visit: Payer: Self-pay | Admitting: Cardiovascular Disease

## 2021-03-24 ENCOUNTER — Ambulatory Visit: Payer: BC Managed Care – PPO | Admitting: Family Medicine

## 2021-03-25 ENCOUNTER — Inpatient Hospital Stay (HOSPITAL_BASED_OUTPATIENT_CLINIC_OR_DEPARTMENT_OTHER): Payer: Medicare Other | Admitting: Internal Medicine

## 2021-03-25 ENCOUNTER — Other Ambulatory Visit: Payer: Self-pay

## 2021-03-25 ENCOUNTER — Inpatient Hospital Stay: Payer: Medicare Other

## 2021-03-25 ENCOUNTER — Encounter: Payer: Self-pay | Admitting: Internal Medicine

## 2021-03-25 ENCOUNTER — Inpatient Hospital Stay: Payer: Medicare Other | Attending: Internal Medicine

## 2021-03-25 VITALS — BP 121/50 | HR 73

## 2021-03-25 VITALS — BP 129/62 | HR 80 | Temp 98.6°F | Ht 65.0 in | Wt 212.5 lb

## 2021-03-25 DIAGNOSIS — D649 Anemia, unspecified: Secondary | ICD-10-CM | POA: Diagnosis not present

## 2021-03-25 DIAGNOSIS — D509 Iron deficiency anemia, unspecified: Secondary | ICD-10-CM | POA: Diagnosis not present

## 2021-03-25 DIAGNOSIS — Z79899 Other long term (current) drug therapy: Secondary | ICD-10-CM | POA: Diagnosis not present

## 2021-03-25 DIAGNOSIS — Z87891 Personal history of nicotine dependence: Secondary | ICD-10-CM | POA: Insufficient documentation

## 2021-03-25 DIAGNOSIS — Z85118 Personal history of other malignant neoplasm of bronchus and lung: Secondary | ICD-10-CM | POA: Diagnosis not present

## 2021-03-25 LAB — CBC WITH DIFFERENTIAL/PLATELET
Abs Immature Granulocytes: 0.02 10*3/uL (ref 0.00–0.07)
Basophils Absolute: 0 10*3/uL (ref 0.0–0.1)
Basophils Relative: 0 %
Eosinophils Absolute: 0.1 10*3/uL (ref 0.0–0.5)
Eosinophils Relative: 2 %
HCT: 28.7 % — ABNORMAL LOW (ref 36.0–46.0)
Hemoglobin: 8.5 g/dL — ABNORMAL LOW (ref 12.0–15.0)
Immature Granulocytes: 0 %
Lymphocytes Relative: 21 %
Lymphs Abs: 1 10*3/uL (ref 0.7–4.0)
MCH: 29.2 pg (ref 26.0–34.0)
MCHC: 29.6 g/dL — ABNORMAL LOW (ref 30.0–36.0)
MCV: 98.6 fL (ref 80.0–100.0)
Monocytes Absolute: 0.4 10*3/uL (ref 0.1–1.0)
Monocytes Relative: 9 %
Neutro Abs: 3.2 10*3/uL (ref 1.7–7.7)
Neutrophils Relative %: 68 %
Platelets: 202 10*3/uL (ref 150–400)
RBC: 2.91 MIL/uL — ABNORMAL LOW (ref 3.87–5.11)
RDW: 14.4 % (ref 11.5–15.5)
WBC: 4.7 10*3/uL (ref 4.0–10.5)
nRBC: 0 % (ref 0.0–0.2)

## 2021-03-25 LAB — BASIC METABOLIC PANEL
Anion gap: 4 — ABNORMAL LOW (ref 5–15)
BUN: 26 mg/dL — ABNORMAL HIGH (ref 8–23)
CO2: 35 mmol/L — ABNORMAL HIGH (ref 22–32)
Calcium: 8.5 mg/dL — ABNORMAL LOW (ref 8.9–10.3)
Chloride: 100 mmol/L (ref 98–111)
Creatinine, Ser: 0.89 mg/dL (ref 0.44–1.00)
GFR, Estimated: >60 mL/min (ref >60)
Glucose, Bld: 151 mg/dL — ABNORMAL HIGH (ref 70–99)
Potassium: 4.3 mmol/L (ref 3.5–5.1)
Sodium: 139 mmol/L (ref 135–145)

## 2021-03-25 LAB — IRON AND TIBC
Iron: 44 ug/dL (ref 28–170)
Saturation Ratios: 11 % (ref 10.4–31.8)
TIBC: 407 ug/dL (ref 250–450)
UIBC: 363 ug/dL

## 2021-03-25 LAB — FERRITIN: Ferritin: 14 ng/mL (ref 11–307)

## 2021-03-25 MED ORDER — IRON SUCROSE 20 MG/ML IV SOLN
200.0000 mg | Freq: Once | INTRAVENOUS | Status: AC
  Administered 2021-03-25: 200 mg via INTRAVENOUS
  Filled 2021-03-25: qty 10

## 2021-03-25 MED ORDER — SODIUM CHLORIDE 0.9 % IV SOLN
Freq: Once | INTRAVENOUS | Status: AC
  Filled 2021-03-25: qty 250

## 2021-03-25 MED ORDER — METHYLPREDNISOLONE SODIUM SUCC 125 MG IJ SOLR
125.0000 mg | Freq: Once | INTRAMUSCULAR | Status: AC | PRN
  Administered 2021-03-25: 125 mg via INTRAVENOUS

## 2021-03-25 MED ORDER — SODIUM CHLORIDE 0.9 % IV SOLN: 200.0000 mg | Freq: Once | INTRAVENOUS | Status: DC

## 2021-03-25 NOTE — Progress Notes (Signed)
[facility] NOTE  Patient Care Team: Tonia Ghent, MD as PCP - General Rockey Situ, Kathlene November, MD as Consulting Physician (Cardiology)  CHIEF COMPLAINTS/PURPOSE OF CONSULTATION: ANEMIA   HEMATOLOGY HISTORY:  # CHRONIC INTERMITTENT ANEMIA  [since 2010]- Hb SEP 2022- 8; previously on PO Iron- ; WBC/platelets- Normal; MCV- 90s. EGD- > 15 years; /Colonoscopy:4 years- [Dr.Jacob;; in [place]] capsule-? Bone marrow Biopsy-none; OCT 2022- SEVERE IRON DEFICIENCY; myeloma work-up/hemolysis work-up negative; JAN 2023-CT scan duodenal thickening-recommend GI evaluation  #   AUG 2021- RIGHT LUNG stage I non-small cell lung cancer-  s/p SBRT   [Dr.Moody; [place]]; NOV 2022- CT chest-no recurrent disease.  # COPD on 2.5 lit/day [Dr.Sood]; diastolic CHF/ OCT 7989-QJJ BP [needing to come off losartan- Dr.Gollan]; peripheral neuropathy.  HISTORY OF PRESENTING ILLNESS: Alone; 2.5 Lit/ O2 24 x7. In wheel chair.  Daughter on the phone.  Alexandria [name] 86 y.o.  female with iron deficiency s/p venofer x 5 is here for a follow up/review results of the CT scan  She did improve after iron infusion. Patient feels tired the last few weeks.    She continues to deny any blood in stools or black-colored stools.    Review of Systems  Constitutional:  Positive for malaise/fatigue. Negative for chills, diaphoresis, fever and weight loss.  HENT:  Negative for nosebleeds and sore throat.   Eyes:  Negative for double vision.  Respiratory:  Positive for cough and shortness of breath. Negative for hemoptysis, sputum production and wheezing.   Cardiovascular:  Negative for chest pain, palpitations, orthopnea and leg swelling.  Gastrointestinal:  Negative for abdominal pain, blood in stool, constipation, diarrhea, heartburn, melena, nausea and vomiting.  Genitourinary:  Negative for dysuria, frequency and urgency.  Musculoskeletal:  Negative for back pain and joint pain.  Skin: Negative.  Negative  for itching and rash.  Neurological:  Positive for dizziness. Negative for tingling, focal weakness, weakness and headaches.  Endo/Heme/Allergies:  Does not bruise/bleed easily.  Psychiatric/Behavioral:  Negative for depression. The patient is not nervous/anxious and does not have insomnia.    MEDICAL HISTORY:  Past Medical History:  Diagnosis Date   Allergy, unspecified not elsewhere classified    Anxiety    Cataract    Chronic diastolic congestive heart failure (HCC)    COPD (chronic obstructive pulmonary disease) ([place]) 03/08/1998   PFTs 12/12/2002 FEV 1 1.42 (64%) ratio 58 with no better after B2 and DLCO75%; PFTs 11/20/09 FEV1 1.50 (73%) ratio 50 no better after B2 with DLCO 62%; Hfa 50% 11/20/2009 >75%, 01/13/10 p coaching   Depression 12/07/1998   Diabetes mellitus type II 02/05/2006   Dr. Cruzita Lederer with endo   Disorders of bursae and tendons in shoulder region, unspecified    Rotator cuff syndrome, right   E. coli bacteremia    Esophagitis    GERD (gastroesophageal reflux disease)    History of UTI    Hyperlipidemia 10/04/2000   Hypertension 03/08/1992   Hypothyroidism 03/08/1968   Iron deficiency anemia    Lung nodule    radiation starts 10-08-2019   Malignant neoplasm of right upper lobe of lung ([place]) 07/24/2019   Obesity    NOS   OSA (obstructive sleep apnea)    PSG 01/27/10 AHI 13, pt does not know CPAP settings   Peripheral neuropathy    Likely due to DM per Dr. Murvin Natal hernia     SURGICAL HISTORY: Past Surgical History:  Procedure Laterality Date   ABD U/S  03/19/1999   Nml x2 foci in liver   ADENOSINE MYOVIEW  06/02/2007   Nml   CARDIOLITE PERSANTINE  08/24/2000   Nml   CAROTID U/S  08/24/2000   1-39% ICA stenosis   CAROTID U/S  06/02/2007   No apprec change    CARPAL TUNNEL RELEASE  12/1997   Right   CESAREAN SECTION     x2 [place]/ repeat   CHOLECYSTECTOMY  1997   COLONOSCOPY WITH PROPOFOL N/A 02/12/2016   Procedure: COLONOSCOPY WITH PROPOFOL;   Surgeon: Milus Banister, MD;  Location: [facility];  Service: Endoscopy;  Laterality: N/A;   CT ABD W & PELVIS WO/W CM     Abd hemangiomas of liver, 1 cm R renal cyst   DENTAL SURGERY  2016   Implants   DEXA  07/03/2003   Nml   ESOPHAGOGASTRODUODENOSCOPY  12/05/1997   Nml (due to hoarseness)   ESOPHAGOGASTRODUODENOSCOPY (EGD) WITH PROPOFOL N/A 02/12/2016   Procedure: ESOPHAGOGASTRODUODENOSCOPY (EGD) WITH PROPOFOL;  Surgeon: Milus Banister, MD;  Location: [facility];  Service: Endoscopy;  Laterality: N/A;   GALLBLADDER SURGERY     HERNIA REPAIR  01/24/2009   Lap Ventr w/ Lysis of adhesions (Dr. Donne Hazel)   knee arthroscopic surgery  years ago   right   ROTATOR CUFF REPAIR  1984   Right, [place]   SHOULDER OPEN ROTATOR CUFF REPAIR  02/08/2012   Procedure: ROTATOR CUFF REPAIR SHOULDER OPEN;  Surgeon: Magnus Sinning, MD;  Location: [facility];  Service: Orthopedics;  Laterality: Left;  Left Shoulder Open Anterior Acrominectomy Rotator Cuff Repair Open Distal Clavicle Resection ,tissue mend graft, and repair of biceps tendon   THUMB RELEASE  12/1997   Right   TONSILLECTOMY     TOTAL ABDOMINAL HYSTERECTOMY  1985   Due to dysmennorhea   US ECHOCARDIOGRAPHY  06/02/2007    SOCIAL HISTORY: Social History   Socioeconomic History   Marital status: Married    Spouse name: Not on file   Number of children: 2   Years of education: Not on file   Highest education level: Not on file  Occupational History   Occupation: Retired    Fish farm manager: RETIRED  Tobacco Use   Smoking status: Former    Packs/day: 2.00    Years: 50.00    Pack years: 100.00    Types: Cigarettes    Quit date: 02/05/2005    Years since quitting: 16.1   Smokeless tobacco: Never  Vaping Use   Vaping Use: Never used  Substance and Sexual Activity   Alcohol use: Yes    Comment: occasionally   Drug use: Never   Sexual activity: Not Currently  Other Topics Concern   Not on file  Social History Narrative    Married with 2 children; Enjoys painting      Quit smoking in 2007; no alcohol; lives in [place] with husband; [name]/d lives in [place]. Had own business. Dont drive- sec to neuropathy.    Social Determinants of Health   Financial Resource Strain: Not on file  Food Insecurity: Not on file  Transportation Needs: Not on file  Physical Activity: Not on file  Stress: Not on file  Social Connections: Not on file  Intimate Partner Violence: Not on file    FAMILY HISTORY: Family History  Problem Relation Age of Onset   Heart disease Mother    Thyroid disease Mother    Emphysema Father        One lung   Cystic fibrosis Sister  Hyperthyroidism Sister    Osteoarthritis Brother    Hyperthyroidism Brother    Esophageal cancer Neg Hx    Cancer Neg Hx        Head or neck   Colon cancer Neg Hx    Stomach cancer Neg Hx    Breast cancer Neg Hx     ALLERGIES:  is allergic to antihistamines, diphenhydramine-type; sulfonamide derivatives; incruse ellipta [umeclidinium bromide]; clarithromycin; codeine; lyrica [pregabalin]; spiriva handihaler [tiotropium bromide monohydrate]; and vraylar [cariprazine].  MEDICATIONS:  Current Outpatient Medications  Medication Sig Dispense Refill   albuterol (PROAIR HFA) 108 (90 Base) MCG/ACT inhaler Inhale 2 puffs into the lungs every 6 (six) hours as needed for wheezing or shortness of breath. INHALE 2 PUFFS INTO THE LUNGS EVERY 6 HOURS AS NEEDED FOR WHEEZING OR SHORTNESS OF BREATH 3 Inhaler 3   budesonide-formoterol (SYMBICORT) 80-4.5 MCG/ACT inhaler Inhale 2 puffs into the lungs 2 (two) times daily. 1 each 12   busPIRone (BUSPAR) 15 MG tablet Take 15 mg by mouth 2 (two) times daily.     Cholecalciferol (VITAMIN D) 50 MCG (2000 UT) tablet Take 1 tablet (2,000 Units total) by mouth daily.     Coenzyme Q-10 200 MG CAPS Take 200 mg by mouth daily.     Cyanocobalamin (B-12) 5000 MCG CAPS Take 5,000 mcg by mouth daily.     esomeprazole (NEXIUM) 40 MG  capsule Take 40 mg by mouth every other day.      ezetimibe (ZETIA) 10 MG tablet TAKE 1 TABLET BY MOUTH EVERYDAY AT BEDTIME 90 tablet 1   fexofenadine (ALLEGRA) 180 MG tablet Take 180 mg by mouth as needed.      FLUoxetine (PROZAC) 20 MG tablet Take 20 mg by mouth 2 (two) times daily.     fluticasone (FLONASE) 50 MCG/ACT nasal spray USE 2 SPRAYS INTO THE NOSE EVERY 12 HOURS AS NEEDED FOR STUFFY NOSE 48 mL 2   furosemide (LASIX) 20 MG tablet TAKE 1 TABLET (20 MG TOTAL) BY MOUTH DAILY AS NEEDED FOR FLUID. 90 tablet 1   glucose blood (ONETOUCH VERIO) test strip Use as instructed to check sugar 2-3x time daily 200 each 5   Iron, Ferrous Sulfate, 325 (65 Fe) MG TABS Take 325 mg by mouth every other day. 90 tablet 3   metFORMIN (GLUCOPHAGE) 500 MG tablet Take 1 tablet (500 mg total) by mouth daily with supper. 90 tablet 3   midodrine (PROAMATINE) 5 MG tablet TAKE 1 TABLET (5 MG TOTAL) BY MOUTH 3 (THREE) TIMES DAILY WITH MEALS AS NEEDED. 90 tablet 0   nitroGLYCERIN (NITROSTAT) 0.4 MG SL tablet Place 1 tablet (0.4 mg total) under the tongue every 5 (five) minutes as needed for chest pain. 25 tablet 3   ONETOUCH DELICA LANCETS FINE MISC USE TO CHECK SUGAR 1 TIME DAILY 100 each 5   ONETOUCH VERIO test strip USE AS INSTRUCTED TO CHECK SUGAR 1 TIME DAILY 100 strip 8   rosuvastatin (CRESTOR) 10 MG tablet TAKE 1 TABLET (10 MG TOTAL) BY MOUTH EVERY OTHER DAY. 45 tablet 2   Spacer/Aero Chamber Mouthpiece MISC Use with  inhaler as needed.  J44.9 1 each 0   SYNTHROID 150 MCG tablet TAKE 1 TABLET BY MOUTH DAILY BEFORE BREAKFAST. 90 tablet 2   No current facility-administered medications for this visit.   Facility-Administered Medications Ordered in Other Visits  Medication Dose Route Frequency Provider Last Rate Last Admin   iron sucrose (VENOFER) injection 200 mg  200 mg Intravenous Once [place],  Elisha Headland, MD          PHYSICAL EXAMINATION:   Vitals:   03/25/21 1430  BP: 129/62  Pulse: 80  Temp:  98.6 F (37 C)  SpO2: 96%   Filed Weights   03/25/21 1430  Weight: 212 lb 8 oz (96.4 kg)    Physical Exam Vitals and nursing note reviewed.  HENT:     Head: Normocephalic and atraumatic.     Mouth/Throat:     Pharynx: Oropharynx is clear.  Eyes:     Extraocular Movements: Extraocular movements intact.     Pupils: Pupils are equal, round, and reactive to light.  Cardiovascular:     Rate and Rhythm: Normal rate and regular rhythm.  Pulmonary:     Comments: Decreased breath sounds bilaterally.  Scattered wheezing. Abdominal:     Palpations: Abdomen is soft.  Musculoskeletal:        General: Normal range of motion.     Cervical back: Normal range of motion.  Skin:    General: Skin is warm.  Neurological:     General: No focal deficit present.     Mental Status: She is alert and oriented to person, place, and time.  Psychiatric:        Behavior: Behavior normal.        Judgment: Judgment normal.    LABORATORY DATA:  I have reviewed the data as listed Lab Results  Component Value Date   WBC 4.7 03/25/2021   HGB 8.5 (L) 03/25/2021   HCT 28.7 (L) 03/25/2021   MCV 98.6 03/25/2021   PLT 202 03/25/2021   Recent Labs    09/12/20 0952 12/16/20 1209 01/15/21 1143 01/28/21 1244 03/17/21 1616 03/25/21 1416  NA 143 140  --  141  --  139  K 4.3 4.5  --  4.6  --  4.3  CL 103 100  --  100  --  100  CO2 35* 33*  --  34*  --  35*  GLUCOSE 157* 122*  --  119*  --  151*  BUN 29* 24* 25* 22  --  26*  CREATININE 0.98 0.74 0.74 0.79 0.80 0.89  CALCIUM 9.1 8.9  --  8.7*  --  8.5*  GFRNONAA  --  >60 >60 >60  --  >60  PROT 6.7 6.5  --   --   --   --   ALBUMIN 3.9 3.4*  --   --   --   --   AST 10 14*  --   --   --   --   ALT 8 12  --   --   --   --   ALKPHOS 87 74  --   --   --   --   BILITOT 0.3 0.2*  --   --   --   --      CT ABDOMEN PELVIS W CONTRAST  Result Date: 03/18/2021 CLINICAL DATA:  Unintentional weight loss, severe anemia. EXAM: CT ABDOMEN AND PELVIS WITH  CONTRAST TECHNIQUE: Multidetector CT imaging of the abdomen and pelvis was performed using the standard protocol following bolus administration of intravenous contrast. CONTRAST:  16m OMNIPAQUE IOHEXOL 300 MG/ML  SOLN COMPARISON:  Multiple priors including PET-CT Jul 18, 2019 and CT abdomen pelvis May 10, 2014 FINDINGS: Lower chest: Left lower lobe atelectasis for scarring. Mitral annular calcifications. Hepatobiliary: Partially exophytic 3.5 cm lesion along the left hepatic lobe which demonstrates peripheral nodular discontinuous enhancement with filling in on  delayed imaging, stable dating back to at least 2016 and consistent with a benign hepatic hemangioma. No suspicious hepatic lesion. Gallbladder surgically absent. No biliary ductal dilation. Pancreas: No pancreatic ductal dilation or evidence of acute inflammation. Spleen: Within normal limits. Adrenals/Urinary Tract: Bilateral adrenal glands are unremarkable. No hydronephrosis. Bilateral hypodense renal cysts measuring up to 2.6 cm on the right. Additional bilateral subcentimeter renal lesions are too small to accurately characterize but statistically likely to reflect cysts. No solid enhancing renal mass. Urinary bladder is unremarkable for degree of distension. Stomach/Bowel: Radiopaque enteric contrast material traverses the hepatic flexure. Stomach is unremarkable for degree of distension. Mild wall thickening of the proximal duodenum. No pathologic dilation of small or large bowel. The terminal ileum appears normal. The appendix is not confidently identified however there is no pericecal inflammation. Extensive colonic diverticulosis without findings of acute diverticulitis. Vascular/Lymphatic: Aortic and branch vessel atherosclerosis without abdominal aortic aneurysm. No pathologically enlarged abdominal or pelvic lymph nodes. Reproductive: Status post hysterectomy. No adnexal masses. Other: No significant abdominopelvic free fluid. Musculoskeletal:  Chronic bilateral L5 pars defects. Multilevel degenerative changes spine. No acute osseous abnormality. IMPRESSION: 1. Mild wall thickening of the proximal duodenum at least in part related to under distension, may reflect peptic ulcer disease/duodenitis. 2. Extensive colonic diverticulosis without findings of acute diverticulitis. 3. Stable benign hepatic hemangioma. 4. Chronic bilateral L5 pars defects. 5.  Aortic Atherosclerosis (ICD10-I70.0). Electronically Signed   By: Dahlia Bailiff M.D.   On: 03/18/2021 07:48    Symptomatic anemia  # Iron deficiency Anemia symptomatic hemoglobin 8.3-chronic intermittent [SINCE 2010]; s/p IV Venofer x5.  Hemoglobin improved to 9.6; but today Hb 8.2- proceed with venofer.   # ETIOLOGY:  Discussed with patient/daughter that etiology of iron deficient unclear; however. JAN- 11th, 2023- Mild wall thickening of the proximal duodenum at least in part related to under distension, may reflect peptic ulcer disease/duodenitis. Given patient's tenuous cardiorespiratory status initially reluctant with GI evaluation.  However today-patient agrees to GI referral; however today she will call us back with a choice of gastroenterologist; we will then make the referral.  #Incidental findings on  CT Imaging dated:Jan 11th, 2023-liver hemangioma; arthritis the back; atherosclerosis I reviewed/discussed/counseled the patient/daughter, tanya.    # DISPOSITION: # Venofer today # venofer weekly x 2 # follow up- 3 months- MD;; cbc/bmp-- venofer- Dr.B  # I reviewed the blood work- with the patient in detail; also reviewed the imaging independently [as summarized above]; and with the patient and daughter, Lavella Lemons over the phone.      All questions were answered. The patient knows to call the clinic with any problems, questions or concerns.    Cammie Sickle, MD 03/25/2021 3:32 PM

## 2021-03-25 NOTE — Patient Instructions (Signed)

## 2021-03-25 NOTE — Progress Notes (Signed)
Venofer infusion started at 1533. 1545 patient complaining of SOB/flushed in face, "chest feels full" . Remainder of Venofer disconnected and IV line flushed with NS, VS stable, PO2 at [place] NP at chairside at 1547 to evaluate patient. Wheezing noted per NP and orders to give Solumedrol 125mg . Given at 1550  Patient reports feeling much better at 1605. Dr Rogue Bussing chairside to see patient, no wheezing reported per MD. Plan to add steroids as premed for further Venofer infusions. Patient aware and verbalized understanding. Discharged home. Stable at discharge.

## 2021-03-25 NOTE — Progress Notes (Signed)
Would like results of her scan done on her stomach.

## 2021-03-25 NOTE — Assessment & Plan Note (Addendum)
#   Iron deficiency Anemia symptomatic hemoglobin 8.3-chronic intermittent [SINCE 2010]; s/p IV Venofer x5.  Hemoglobin improved to 9.6; but today Hb 8.2- proceed with venofer.   # ETIOLOGY:  Discussed with patient/daughter that etiology of iron deficient unclear; however. JAN- 11th, 2023- Mild wall thickening of the proximal duodenum at least in part related to under distension, may reflect peptic ulcer disease/duodenitis. Given patient's tenuous cardiorespiratory status initially reluctant with GI evaluation.  However today-patient agrees to GI referral; however today she will call us back with a choice of gastroenterologist; we will then make the referral.  #Incidental findings on  CT Imaging dated:Jan 11th, 2023-liver hemangioma; arthritis the back; atherosclerosis I reviewed/discussed/counseled the patient/daughter, tanya.    # DISPOSITION: # Venofer today # venofer weekly x 2 # follow up- 3 months- MD;; cbc/bmp-- venofer- Dr.B  # I reviewed the blood work- with the patient in detail; also reviewed the imaging independently [as summarized above]; and with the patient and daughter, Lavella Lemons over the phone.

## 2021-03-31 ENCOUNTER — Ambulatory Visit: Payer: BC Managed Care – PPO | Admitting: Family Medicine

## 2021-03-31 ENCOUNTER — Other Ambulatory Visit: Payer: Self-pay | Admitting: Internal Medicine

## 2021-03-31 NOTE — Progress Notes (Signed)
Added Solu-Medrol premedication/given possible reaction to Venofer #5 .on 1/18 patient had  wheezing/shortness of breath Improved with steroids.

## 2021-04-01 ENCOUNTER — Other Ambulatory Visit: Payer: Self-pay

## 2021-04-01 ENCOUNTER — Inpatient Hospital Stay: Payer: Medicare Other

## 2021-04-01 VITALS — BP 148/56 | HR 78 | Temp 96.5°F

## 2021-04-01 DIAGNOSIS — Z85118 Personal history of other malignant neoplasm of bronchus and lung: Secondary | ICD-10-CM | POA: Diagnosis not present

## 2021-04-01 DIAGNOSIS — Z79899 Other long term (current) drug therapy: Secondary | ICD-10-CM | POA: Diagnosis not present

## 2021-04-01 DIAGNOSIS — D649 Anemia, unspecified: Secondary | ICD-10-CM

## 2021-04-01 DIAGNOSIS — Z87891 Personal history of nicotine dependence: Secondary | ICD-10-CM | POA: Diagnosis not present

## 2021-04-01 DIAGNOSIS — D509 Iron deficiency anemia, unspecified: Secondary | ICD-10-CM | POA: Diagnosis not present

## 2021-04-01 MED ORDER — IRON SUCROSE 20 MG/ML IV SOLN
200.0000 mg | Freq: Once | INTRAVENOUS | Status: AC
  Administered 2021-04-01: 15:00:00 200 mg via INTRAVENOUS
  Filled 2021-04-01: qty 10

## 2021-04-01 MED ORDER — SODIUM CHLORIDE 0.9 % IV SOLN: 200.0000 mg | Freq: Once | INTRAVENOUS | Status: DC

## 2021-04-01 MED ORDER — SODIUM CHLORIDE 0.9 % IV SOLN
Freq: Once | INTRAVENOUS | Status: AC
  Filled 2021-04-01: qty 250

## 2021-04-01 MED ORDER — METHYLPREDNISOLONE SODIUM SUCC 125 MG IJ SOLR
40.0000 mg | Freq: Once | INTRAMUSCULAR | Status: AC
  Administered 2021-04-01: 14:00:00 40 mg via INTRAVENOUS
  Filled 2021-04-01: qty 2

## 2021-04-01 NOTE — Patient Instructions (Signed)

## 2021-04-08 ENCOUNTER — Inpatient Hospital Stay: Payer: Medicare Other | Attending: Internal Medicine

## 2021-04-08 ENCOUNTER — Other Ambulatory Visit: Payer: Self-pay

## 2021-04-08 VITALS — BP 158/71 | HR 83 | Temp 98.5°F | Resp 20

## 2021-04-08 DIAGNOSIS — Z79899 Other long term (current) drug therapy: Secondary | ICD-10-CM | POA: Diagnosis not present

## 2021-04-08 DIAGNOSIS — Z85118 Personal history of other malignant neoplasm of bronchus and lung: Secondary | ICD-10-CM | POA: Diagnosis not present

## 2021-04-08 DIAGNOSIS — Z87891 Personal history of nicotine dependence: Secondary | ICD-10-CM | POA: Diagnosis not present

## 2021-04-08 DIAGNOSIS — D649 Anemia, unspecified: Secondary | ICD-10-CM

## 2021-04-08 DIAGNOSIS — D509 Iron deficiency anemia, unspecified: Secondary | ICD-10-CM | POA: Insufficient documentation

## 2021-04-08 MED ORDER — METHYLPREDNISOLONE SODIUM SUCC 125 MG IJ SOLR
40.0000 mg | Freq: Once | INTRAMUSCULAR | Status: AC
  Administered 2021-04-08: 40 mg via INTRAVENOUS
  Filled 2021-04-08: qty 2

## 2021-04-08 MED ORDER — SODIUM CHLORIDE 0.9 % IV SOLN: 200.0000 mg | Freq: Once | INTRAVENOUS | Status: DC

## 2021-04-08 MED ORDER — SODIUM CHLORIDE 0.9 % IV SOLN
Freq: Once | INTRAVENOUS | Status: AC
  Filled 2021-04-08: qty 250

## 2021-04-08 MED ORDER — IRON SUCROSE 20 MG/ML IV SOLN
200.0000 mg | Freq: Once | INTRAVENOUS | Status: AC
  Administered 2021-04-08: 200 mg via INTRAVENOUS
  Filled 2021-04-08: qty 10

## 2021-04-08 NOTE — Patient Instructions (Signed)
[facility]   ?Discharge Instructions: ?Thank you for choosing [facility] to provide your oncology and hematology care.  ?If you have a lab appointment with the Cancer Center, please go directly to the Cancer Center and check in at the registration area. ?  ?We strive to give you quality time with your provider. You may need to reschedule your appointment if you arrive late (15 or more minutes).  Arriving late affects you and other patients whose appointments are after yours.  Also, if you miss three or more appointments without notifying the office, you may be dismissed from the clinic at the provider?s discretion.    ?  ?For prescription refill requests, have your pharmacy contact our office and allow 72 hours for refills to be completed.   ? ?Today you received the following: Venofer.    ?  ?BELOW ARE SYMPTOMS THAT SHOULD BE REPORTED IMMEDIATELY: ?*FEVER GREATER THAN 100.4 F (38 ?C) OR HIGHER ?*CHILLS OR SWEATING ?*NAUSEA AND VOMITING THAT IS NOT CONTROLLED WITH YOUR NAUSEA MEDICATION ?*UNUSUAL SHORTNESS OF BREATH ?*UNUSUAL BRUISING OR BLEEDING ?*URINARY PROBLEMS (pain or burning when urinating, or frequent urination) ?*BOWEL PROBLEMS (unusual diarrhea, constipation, pain near the anus) ?TENDERNESS IN MOUTH AND THROAT WITH OR WITHOUT PRESENCE OF ULCERS (sore throat, sores in mouth, or a toothache) ?UNUSUAL RASH, SWELLING OR PAIN  ?UNUSUAL VAGINAL DISCHARGE OR ITCHING  ? ?Items with * indicate a potential emergency and should be followed up as soon as possible or go to the Emergency Department if any problems should occur. ? ?Should you have questions after your visit or need to cancel or reschedule your appointment, please contact [facility]  Dept: [phone]  and follow the prompts.  Office hours are 8:00 a.m. to 4:30 p.m. Monday - Friday. Please note that voicemails left after 4:00 p.m. may not be returned until the following  business day.  We are closed weekends and major holidays. You have access to a nurse at all times for urgent questions. Please call the main number to the clinic Dept: [phone] and follow the prompts. ? ?For any non-urgent questions, you may also contact your provider using MyChart. We now offer e-Visits for anyone 18 and older to request care online for non-urgent symptoms. For details visit [email or web address]. ?  ?Also download the MyChart app! Go to the app store, search "MyChart", open the app, select [facility], and log in with your MyChart username and password. ? ?Due to Covid, a mask is required upon entering the hospital/clinic. If you do not have a mask, one will be given to you upon arrival. For doctor visits, patients may have 1 support person aged 18 or older with them. For treatment visits, patients cannot have anyone with them due to current Covid guidelines and our immunocompromised population.  ?

## 2021-04-21 ENCOUNTER — Other Ambulatory Visit: Payer: Self-pay | Admitting: Cardiovascular Disease

## 2021-04-25 ENCOUNTER — Other Ambulatory Visit: Payer: Self-pay | Admitting: Cardiovascular Disease

## 2021-04-27 ENCOUNTER — Other Ambulatory Visit: Payer: Self-pay

## 2021-04-27 ENCOUNTER — Ambulatory Visit (INDEPENDENT_AMBULATORY_CARE_PROVIDER_SITE_OTHER): Payer: Medicare Other | Admitting: Nurse Practitioner

## 2021-04-27 ENCOUNTER — Telehealth: Payer: Self-pay | Admitting: *Deleted

## 2021-04-27 ENCOUNTER — Encounter: Payer: Self-pay | Admitting: Internal Medicine

## 2021-04-27 VITALS — BP 140/72 | HR 58 | Temp 97.9°F | Resp 16 | Ht 65.0 in | Wt 210.2 lb

## 2021-04-27 DIAGNOSIS — R062 Wheezing: Secondary | ICD-10-CM | POA: Diagnosis not present

## 2021-04-27 DIAGNOSIS — R051 Acute cough: Secondary | ICD-10-CM

## 2021-04-27 DIAGNOSIS — R0982 Postnasal drip: Secondary | ICD-10-CM | POA: Diagnosis not present

## 2021-04-27 MED ORDER — PREDNISONE 20 MG PO TABS: ORAL_TABLET | ORAL | 0 refills | Status: AC

## 2021-04-27 NOTE — Assessment & Plan Note (Signed)
Patient has scattered wheezing posterior and anteriorly.  Patient did not endorse shortness of breath, increase in sputum production, change in sputum color.  Do not think she is having a COPD exacerbation.  Did have joint discussion with patient including obtaining a chest x-ray she would like to defer right now.  Also defer with antibiotics as she does not seem to be in a state of exacerbation.  Did review strict signs symptoms when to be reevaluated or seen in urgent or emergent care setting.  If she does not improve or declines she can reach out to me and we can start empiric antibiotic therapy.

## 2021-04-27 NOTE — Progress Notes (Signed)
Acute Office Visit  Subjective:    Patient ID: Alexandria [name], female    DOB: 23-Nov-1935, 86 y.o.   MRN: [id number]  Chief Complaint  Patient presents with   Cough    Daily from post nasal drainage but her daughter wanted to make sure she is not developing something in her chest.     Patient is in today for Cough  States that she has to clear her throat all the time due to mucous and when she talks a lot it gets wrose. Has been approx 1 month  No sick contacts. 2L [place] all the time. She did mention that her husband recently passed away Pulmon: Dr Halford Chessman. Sees them every 3 to 6 months Cardiology: Dr. Esmond Plants  Patient does have lung disease. Has not had to use her albuterol inhaler any more than normal. States she does have Flonase but forgets to take it some times. States she has not had a changed in her sputum color, the amount of sputum and no increase in shortness of breath   Covid vaccines x2 and one booster. Flu vaccine this season  Past Medical History:  Diagnosis Date   Allergy, unspecified not elsewhere classified    Anxiety    Cataract    Chronic diastolic congestive heart failure (HCC)    COPD (chronic obstructive pulmonary disease) ([place]) 03/08/1998   PFTs 12/12/2002 FEV 1 1.42 (64%) ratio 58 with no better after B2 and DLCO75%; PFTs 11/20/09 FEV1 1.50 (73%) ratio 50 no better after B2 with DLCO 62%; Hfa 50% 11/20/2009 >75%, 01/13/10 p coaching   Depression 12/07/1998   Diabetes mellitus type II 02/05/2006   Dr. Cruzita Lederer with endo   Disorders of bursae and tendons in shoulder region, unspecified    Rotator cuff syndrome, right   E. coli bacteremia    Esophagitis    GERD (gastroesophageal reflux disease)    History of UTI    Hyperlipidemia 10/04/2000   Hypertension 03/08/1992   Hypothyroidism 03/08/1968   Iron deficiency anemia    Lung nodule    radiation starts 10-08-2019   Malignant neoplasm of right upper lobe of lung ([place]) 07/24/2019   Obesity    NOS   OSA  (obstructive sleep apnea)    PSG 01/27/10 AHI 13, pt does not know CPAP settings   Peripheral neuropathy    Likely due to DM per Dr. Murvin Natal hernia     Past Surgical History:  Procedure Laterality Date   ABD U/S  03/19/1999   Nml x2 foci in liver   ADENOSINE MYOVIEW  06/02/2007   Nml   CARDIOLITE PERSANTINE  08/24/2000   Nml   CAROTID U/S  08/24/2000   1-39% ICA stenosis   CAROTID U/S  06/02/2007   No apprec change    CARPAL TUNNEL RELEASE  12/1997   Right   CESAREAN SECTION     x2 [place]/ repeat   CHOLECYSTECTOMY  1997   COLONOSCOPY WITH PROPOFOL N/A 02/12/2016   Procedure: COLONOSCOPY WITH PROPOFOL;  Surgeon: Milus Banister, MD;  Location: [facility];  Service: Endoscopy;  Laterality: N/A;   CT ABD W & PELVIS WO/W CM     Abd hemangiomas of liver, 1 cm R renal cyst   DENTAL SURGERY  2016   Implants   DEXA  07/03/2003   Nml   ESOPHAGOGASTRODUODENOSCOPY  12/05/1997   Nml (due to hoarseness)   ESOPHAGOGASTRODUODENOSCOPY (EGD) WITH PROPOFOL N/A 02/12/2016   Procedure: ESOPHAGOGASTRODUODENOSCOPY (EGD) WITH  PROPOFOL;  Surgeon: Milus Banister, MD;  Location: [facility];  Service: Endoscopy;  Laterality: N/A;   GALLBLADDER SURGERY     HERNIA REPAIR  01/24/2009   Lap Ventr w/ Lysis of adhesions (Dr. Donne Hazel)   knee arthroscopic surgery  years ago   right   ROTATOR CUFF REPAIR  1984   Right, [place]   SHOULDER OPEN ROTATOR CUFF REPAIR  02/08/2012   Procedure: ROTATOR CUFF REPAIR SHOULDER OPEN;  Surgeon: Magnus Sinning, MD;  Location: [facility];  Service: Orthopedics;  Laterality: Left;  Left Shoulder Open Anterior Acrominectomy Rotator Cuff Repair Open Distal Clavicle Resection ,tissue mend graft, and repair of biceps tendon   THUMB RELEASE  12/1997   Right   TONSILLECTOMY     TOTAL ABDOMINAL HYSTERECTOMY  1985   Due to dysmennorhea   US ECHOCARDIOGRAPHY  06/02/2007    Family History  Problem Relation Age of Onset   Heart disease Mother    Thyroid disease  Mother    Emphysema Father        One lung   Cystic fibrosis Sister    Hyperthyroidism Sister    Osteoarthritis Brother    Hyperthyroidism Brother    Esophageal cancer Neg Hx    Cancer Neg Hx        Head or neck   Colon cancer Neg Hx    Stomach cancer Neg Hx    Breast cancer Neg Hx     Social History   Socioeconomic History   Marital status: Widowed    Spouse name: Not on file   Number of children: 2   Years of education: Not on file   Highest education level: Not on file  Occupational History   Occupation: Retired    Fish farm manager: RETIRED  Tobacco Use   Smoking status: Former    Packs/day: 2.00    Years: 50.00    Pack years: 100.00    Types: Cigarettes    Quit date: 02/05/2005    Years since quitting: 16.2   Smokeless tobacco: Never  Vaping Use   Vaping Use: Never used  Substance and Sexual Activity   Alcohol use: Yes    Comment: occasionally   Drug use: Never   Sexual activity: Not Currently  Other Topics Concern   Not on file  Social History Narrative   Married with 2 children; Enjoys painting      Quit smoking in 2007; no alcohol; lives in [place] with husband; [name]/d lives in [place]. Had own business. Dont drive- sec to neuropathy.    Social Determinants of Health   Financial Resource Strain: Not on file  Food Insecurity: Not on file  Transportation Needs: Not on file  Physical Activity: Not on file  Stress: Not on file  Social Connections: Not on file  Intimate Partner Violence: Not on file    Outpatient Medications Prior to Visit  Medication Sig Dispense Refill   albuterol (PROAIR HFA) 108 (90 Base) MCG/ACT inhaler Inhale 2 puffs into the lungs every 6 (six) hours as needed for wheezing or shortness of breath. INHALE 2 PUFFS INTO THE LUNGS EVERY 6 HOURS AS NEEDED FOR WHEEZING OR SHORTNESS OF BREATH 3 Inhaler 3   budesonide-formoterol (SYMBICORT) 80-4.5 MCG/ACT inhaler Inhale 2 puffs into the lungs 2 (two) times daily. 1 each 12   busPIRone  (BUSPAR) 15 MG tablet Take 15 mg by mouth 2 (two) times daily.     Cholecalciferol (VITAMIN D) 50 MCG (2000 UT) tablet Take 1 tablet (2,000  Units total) by mouth daily.     Coenzyme Q-10 200 MG CAPS Take 200 mg by mouth daily.     Cyanocobalamin (B-12) 5000 MCG CAPS Take 5,000 mcg by mouth daily.     esomeprazole (NEXIUM) 40 MG capsule Take 40 mg by mouth every other day.      ezetimibe (ZETIA) 10 MG tablet TAKE 1 TABLET BY MOUTH EVERYDAY AT BEDTIME 90 tablet 1   fexofenadine (ALLEGRA) 180 MG tablet Take 180 mg by mouth as needed.      FLUoxetine (PROZAC) 20 MG tablet Take 20 mg by mouth 2 (two) times daily.     fluticasone (FLONASE) 50 MCG/ACT nasal spray USE 2 SPRAYS INTO THE NOSE EVERY 12 HOURS AS NEEDED FOR STUFFY NOSE 48 mL 2   furosemide (LASIX) 20 MG tablet TAKE 1 TABLET (20 MG TOTAL) BY MOUTH DAILY AS NEEDED FOR FLUID. 90 tablet 1   glucose blood (ONETOUCH VERIO) test strip Use as instructed to check sugar 2-3x time daily 200 each 5   Iron, Ferrous Sulfate, 325 (65 Fe) MG TABS Take 325 mg by mouth every other day. 90 tablet 3   metFORMIN (GLUCOPHAGE) 500 MG tablet Take 1 tablet (500 mg total) by mouth daily with supper. 90 tablet 3   midodrine (PROAMATINE) 5 MG tablet TAKE 1 TABLET (5 MG TOTAL) BY MOUTH 3 (THREE) TIMES DAILY WITH MEALS AS NEEDED. 270 tablet 0   nitroGLYCERIN (NITROSTAT) 0.4 MG SL tablet Place 1 tablet (0.4 mg total) under the tongue every 5 (five) minutes as needed for chest pain. 25 tablet 3   ONETOUCH DELICA LANCETS FINE MISC USE TO CHECK SUGAR 1 TIME DAILY 100 each 5   ONETOUCH VERIO test strip USE AS INSTRUCTED TO CHECK SUGAR 1 TIME DAILY 100 strip 8   rosuvastatin (CRESTOR) 10 MG tablet TAKE 1 TABLET (10 MG TOTAL) BY MOUTH EVERY OTHER DAY. 45 tablet 2   Spacer/Aero Chamber Mouthpiece MISC Use with  inhaler as needed.  J44.9 1 each 0   SYNTHROID 150 MCG tablet TAKE 1 TABLET BY MOUTH DAILY BEFORE BREAKFAST. 90 tablet 2   No facility-administered medications prior to  visit.    Allergies  Allergen Reactions   Antihistamines, Diphenhydramine-Type Other (See Comments)    Able to tolerate only allegra.    Sulfonamide Derivatives Swelling    REACTION: closed throat   Incruse Ellipta [Umeclidinium Bromide] Cough    Caused voice change and severe coughing   Clarithromycin Other (See Comments)    REACTION: diff swallowing and mouth blisters   Codeine Other (See Comments)    Unknown    Lyrica [Pregabalin] Other (See Comments)    Lack of effect for neuropathy pain.     Spiriva Handihaler [Tiotropium Bromide Monohydrate] Other (See Comments)    Voice changes   Vraylar [Cariprazine]     intolerant    Review of Systems  Constitutional:  Negative for chills, fatigue and fever.  HENT:  Positive for postnasal drip. Negative for congestion, ear discharge, ear pain, sinus pressure, sinus pain and sore throat.   Respiratory:  Positive for cough (clear and thick muocs.). Negative for shortness of breath (baseline).   Cardiovascular:  Negative for chest pain.  Gastrointestinal:  Negative for abdominal pain, diarrhea, nausea and vomiting.  Neurological:  Positive for headaches (tylenol and that makes it go away).      Objective:    Physical Exam Vitals and nursing note reviewed.  Constitutional:      Appearance: Normal  appearance.  HENT:     Right Ear: Tympanic membrane, ear canal and external ear normal.     Left Ear: Tympanic membrane, ear canal and external ear normal.     Nose:     Right Sinus: Maxillary sinus tenderness present. No frontal sinus tenderness.     Left Sinus: No maxillary sinus tenderness or frontal sinus tenderness.     Mouth/Throat:     Mouth: Mucous membranes are moist.     Pharynx: Oropharynx is clear.     Comments: Cobblestoning  Cardiovascular:     Rate and Rhythm: Normal rate and regular rhythm.  Pulmonary:     Effort: Pulmonary effort is normal.     Breath sounds: Wheezing present.  Abdominal:     General: Bowel sounds  are normal.  Neurological:     Mental Status: She is alert.    BP 140/72    Pulse (!) 58    Temp 97.9 F (36.6 C)    Resp 16    Ht 5\' 5"  (1.651 m)    Wt 210 lb 4 oz (95.4 kg)    LMP  (LMP Unknown)    SpO2 98% Comment: on 3 liters of O2 right now   BMI 34.99 kg/m  Wt Readings from Last 3 Encounters:  04/27/21 210 lb 4 oz (95.4 kg)  03/25/21 212 lb 8 oz (96.4 kg)  02/06/21 220 lb (99.8 kg)    Health Maintenance Due  Topic Date Due   Zoster Vaccines- Shingrix (1 of 2) Never done   COVID-19 Vaccine (4 - Booster for Pfizer series) 03/14/2020   FOOT EXAM  01/24/2021    There are no preventive care reminders to display for this patient.   Lab Results  Component Value Date   TSH 0.73 11/21/2020   Lab Results  Component Value Date   WBC 4.7 03/25/2021   HGB 8.5 (L) 03/25/2021   HCT 28.7 (L) 03/25/2021   MCV 98.6 03/25/2021   PLT 202 03/25/2021   Lab Results  Component Value Date   NA 139 03/25/2021   K 4.3 03/25/2021   CO2 35 (H) 03/25/2021   GLUCOSE 151 (H) 03/25/2021   BUN 26 (H) 03/25/2021   CREATININE 0.89 03/25/2021   BILITOT 0.2 (L) 12/16/2020   ALKPHOS 74 12/16/2020   AST 14 (L) 12/16/2020   ALT 12 12/16/2020   PROT 6.5 12/16/2020   ALBUMIN 3.4 (L) 12/16/2020   CALCIUM 8.5 (L) 03/25/2021   ANIONGAP 4 (L) 03/25/2021   GFR 53.56 (L) 09/12/2020   Lab Results  Component Value Date   CHOL 148 06/26/2019   Lab Results  Component Value Date   HDL 60.00 06/26/2019   Lab Results  Component Value Date   LDLCALC 69 06/26/2019   Lab Results  Component Value Date   TRIG 93.0 06/26/2019   Lab Results  Component Value Date   CHOLHDL 2 06/26/2019   Lab Results  Component Value Date   HGBA1C 5.0 10/28/2020       Assessment & Plan:   Problem List Items Addressed This Visit       Other   PND (post-nasal drip)    Cobblestoning on exam.  Recommended start taking Flonase regularly      Wheezing    Patient has scattered wheezing posterior and  anteriorly.  Patient did not endorse shortness of breath, increase in sputum production, change in sputum color.  Do not think she is having a COPD exacerbation.  Did have  joint discussion with patient including obtaining a chest x-ray she would like to defer right now.  Also defer with antibiotics as she does not seem to be in a state of exacerbation.  Did review strict signs symptoms when to be reevaluated or seen in urgent or emergent care setting.  If she does not improve or declines she can reach out to me and we can start empiric antibiotic therapy.      Relevant Medications   predniSONE (DELTASONE) 20 MG tablet   Acute cough - Primary    Likely secondary to postnasal drip.  Patient has prescription for Flonase already.  Encourage patient to use Flonase daily for a week.  She does not improve she can reach out via MyChart with this antibiotic for empiric coverage.        Meds ordered this encounter  Medications   predniSONE (DELTASONE) 20 MG tablet    Sig: Take 1 tablet (20 mg total) by mouth 2 (two) times daily with a meal for 3 days, THEN 1 tablet (20 mg total) daily with breakfast for 3 days.    Dispense:  9 tablet    Refill:  0    Order Specific Question:   Supervising Provider    Answer:   Loura Pardon A [[id number]]   This visit occurred during the SARS-CoV-2 public health emergency.  Safety protocols were in place, including screening questions prior to the visit, additional usage of staff PPE, and extensive cleaning of exam room while observing appropriate contact time as indicated for disinfecting solutions.    Romilda Garret, NP

## 2021-04-27 NOTE — Telephone Encounter (Signed)
Patient evaluated in office  Thanks

## 2021-04-27 NOTE — Assessment & Plan Note (Signed)
Likely secondary to postnasal drip.  Patient has prescription for Flonase already.  Encourage patient to use Flonase daily for a week.  She does not improve she can reach out via MyChart with this antibiotic for empiric coverage.

## 2021-04-27 NOTE — Patient Instructions (Addendum)
Nice to see you today Since you are not having the symptoms that we discussed we can hold off on the antibiotic use.  If you do not improve le me know and we can add on the antibiotic We will defer the chest xray today at your request  I would suggest starting back on the flonase everyday for the next week

## 2021-04-27 NOTE — Telephone Encounter (Signed)
Appointment scheduled with Romilda Garret NP today 04/27/21 at 3:00 pm.

## 2021-04-27 NOTE — Telephone Encounter (Signed)
PLEASE NOTE: All timestamps contained within this report are represented as [place] Standard Time. CONFIDENTIALTY NOTICE: This fax transmission is intended only for the addressee. It contains information that is legally privileged, confidential or otherwise protected from use or disclosure. If you are not the intended recipient, you are strictly prohibited from reviewing, disclosing, copying using or disseminating any of this information or taking any action in reliance on or regarding this information. If you have received this fax in error, please notify us immediately by telephone so that we can arrange for its return to [place]. Phone: [phone], Toll-Free: [phone], Fax: [phone] Page: 1 of 1 Call Id: [id number] [facility] - Client Nonclinical Telephone Record  [facility] Client [place] [facility] - Client Client Site [place] Provider Renford Dills - MD Contact Type Call Who Is Calling Patient / Member / Family / Caregiver Caller Name [place] Phone Number [phone] Patient Name Alexandria [name] Patient DOB 1935/10/21 Call Type Message Only Information Provided Reason for Call Request to Schedule Office Appointment Initial Comment Caller states she would like to make an appointment for her mother Patient request to speak to RN No Additional Comment Provided office hours Disp. Time Disposition Final User 04/24/2021 5:33:42 PM General Information Provided Yes Wynetta Emery, [place] Call Closed By: Archie Balboa Transaction Date/Time: 04/24/2021 5:30:28 PM (ET)

## 2021-04-27 NOTE — Assessment & Plan Note (Signed)
Cobblestoning on exam.  Recommended start taking Flonase regularly

## 2021-04-30 ENCOUNTER — Encounter (HOSPITAL_COMMUNITY): Payer: Self-pay | Admitting: Emergency Medicine

## 2021-04-30 ENCOUNTER — Telehealth: Payer: Self-pay | Admitting: Family Medicine

## 2021-04-30 ENCOUNTER — Emergency Department (HOSPITAL_COMMUNITY): Payer: Medicare Other

## 2021-04-30 ENCOUNTER — Emergency Department (HOSPITAL_COMMUNITY)
Admission: EM | Admit: 2021-04-30 | Discharge: 2021-04-30 | Disposition: A | Discharge status: Home / Self Care | Payer: Medicare Other | Attending: Emergency Medicine | Admitting: Emergency Medicine

## 2021-04-30 ENCOUNTER — Other Ambulatory Visit: Payer: Self-pay

## 2021-04-30 DIAGNOSIS — M7989 Other specified soft tissue disorders: Secondary | ICD-10-CM | POA: Diagnosis not present

## 2021-04-30 DIAGNOSIS — W19XXXA Unspecified fall, initial encounter: Secondary | ICD-10-CM | POA: Diagnosis not present

## 2021-04-30 DIAGNOSIS — Z7984 Long term (current) use of oral hypoglycemic drugs: Secondary | ICD-10-CM | POA: Diagnosis not present

## 2021-04-30 DIAGNOSIS — Z23 Encounter for immunization: Secondary | ICD-10-CM | POA: Diagnosis not present

## 2021-04-30 DIAGNOSIS — R519 Headache, unspecified: Secondary | ICD-10-CM | POA: Diagnosis not present

## 2021-04-30 DIAGNOSIS — I509 Heart failure, unspecified: Secondary | ICD-10-CM | POA: Diagnosis not present

## 2021-04-30 DIAGNOSIS — R58 Hemorrhage, not elsewhere classified: Secondary | ICD-10-CM | POA: Diagnosis not present

## 2021-04-30 DIAGNOSIS — S8991XA Unspecified injury of right lower leg, initial encounter: Secondary | ICD-10-CM | POA: Diagnosis present

## 2021-04-30 DIAGNOSIS — S81812A Laceration without foreign body, left lower leg, initial encounter: Secondary | ICD-10-CM | POA: Diagnosis not present

## 2021-04-30 DIAGNOSIS — I10 Essential (primary) hypertension: Secondary | ICD-10-CM | POA: Diagnosis not present

## 2021-04-30 DIAGNOSIS — S81011A Laceration without foreign body, right knee, initial encounter: Secondary | ICD-10-CM | POA: Diagnosis not present

## 2021-04-30 DIAGNOSIS — I110 Hypertensive heart disease with heart failure: Secondary | ICD-10-CM | POA: Insufficient documentation

## 2021-04-30 DIAGNOSIS — W109XXA Fall (on) (from) unspecified stairs and steps, initial encounter: Secondary | ICD-10-CM | POA: Insufficient documentation

## 2021-04-30 DIAGNOSIS — M25521 Pain in right elbow: Secondary | ICD-10-CM | POA: Insufficient documentation

## 2021-04-30 DIAGNOSIS — M25561 Pain in right knee: Secondary | ICD-10-CM | POA: Insufficient documentation

## 2021-04-30 DIAGNOSIS — I6523 Occlusion and stenosis of bilateral carotid arteries: Secondary | ICD-10-CM | POA: Diagnosis not present

## 2021-04-30 DIAGNOSIS — Z7951 Long term (current) use of inhaled steroids: Secondary | ICD-10-CM | POA: Diagnosis not present

## 2021-04-30 DIAGNOSIS — E119 Type 2 diabetes mellitus without complications: Secondary | ICD-10-CM | POA: Diagnosis not present

## 2021-04-30 DIAGNOSIS — M810 Age-related osteoporosis without current pathological fracture: Secondary | ICD-10-CM | POA: Diagnosis not present

## 2021-04-30 DIAGNOSIS — J449 Chronic obstructive pulmonary disease, unspecified: Secondary | ICD-10-CM | POA: Insufficient documentation

## 2021-04-30 DIAGNOSIS — S70311A Abrasion, right thigh, initial encounter: Secondary | ICD-10-CM | POA: Diagnosis not present

## 2021-04-30 DIAGNOSIS — S50311A Abrasion of right elbow, initial encounter: Secondary | ICD-10-CM | POA: Diagnosis not present

## 2021-04-30 DIAGNOSIS — S81811A Laceration without foreign body, right lower leg, initial encounter: Secondary | ICD-10-CM | POA: Diagnosis not present

## 2021-04-30 DIAGNOSIS — S0990XA Unspecified injury of head, initial encounter: Secondary | ICD-10-CM | POA: Diagnosis not present

## 2021-04-30 DIAGNOSIS — Z7952 Long term (current) use of systemic steroids: Secondary | ICD-10-CM | POA: Insufficient documentation

## 2021-04-30 DIAGNOSIS — M25461 Effusion, right knee: Secondary | ICD-10-CM | POA: Diagnosis not present

## 2021-04-30 DIAGNOSIS — I672 Cerebral atherosclerosis: Secondary | ICD-10-CM | POA: Diagnosis not present

## 2021-04-30 DIAGNOSIS — M25562 Pain in left knee: Secondary | ICD-10-CM | POA: Diagnosis not present

## 2021-04-30 MED ORDER — LIDOCAINE-EPINEPHRINE (PF) 2 %-1:200000 IJ SOLN
20.0000 mL | Freq: Once | INTRAMUSCULAR | Status: AC
  Administered 2021-04-30: 20 mL

## 2021-04-30 MED ORDER — BACITRACIN ZINC 500 UNIT/GM EX OINT: TOPICAL_OINTMENT | Freq: Once | CUTANEOUS | Status: AC

## 2021-04-30 MED ORDER — TETANUS-DIPHTH-ACELL PERTUSSIS 5-2.5-18.5 LF-MCG/0.5 IM SUSY
0.5000 mL | PREFILLED_SYRINGE | Freq: Once | INTRAMUSCULAR | Status: AC
  Administered 2021-04-30: 0.5 mL via INTRAMUSCULAR

## 2021-04-30 NOTE — Telephone Encounter (Signed)
Mrs. Alexandria [name] called in due to [name] fell and hurt herself and had to go to the ER to get stiches and was put in a mobilizer and was sent home. Mrs. Alexandria [name] feels like she needs to be in a rehab facility due to she can not take care of herself and she was told that the PCP has to be the one to suggest getting into a facility. I did tell her she would need to make an appointment.

## 2021-04-30 NOTE — ED Triage Notes (Signed)
Patient BIBA from home d/t fall from walker catching on a step when walking upstairs. Pt c/o knee and elbow pain. EMS reports laceration on right knee. Denies LOC or hitting head, no blood thinners. Patient on 3L [place] at baseline.  BP 160/78 HR 79 98% 3 L [place] CBG 137

## 2021-04-30 NOTE — Discharge Instructions (Signed)
You have been seen and discharged from the emergency department.  You sustained a right knee laceration that was repaired in the department.  Your tetanus was updated.  You have been placed in a knee immobilizer to aid in knee laceration healing.  You may remove this knee immobilizer on Sunday.  You may weight-bear as tolerated.  Your x-ray imaging was negative.  Have your sutures reevaluated in 7 to 10 days for removal.  Stable hydrated.  Elevate the leg to avoid significant swelling when you are seated.  Follow-up with your primary provider for further evaluation and further care. Take home medications as prescribed. If you have any worsening symptoms, significant leg swelling, abnormal discharge or severe pain/redness at the laceration site, fever or further concerns for your health please return to an emergency department for further evaluation.  Rehab was offered to you at this visit and you have declined.

## 2021-04-30 NOTE — ED Provider Notes (Signed)
[facility] DEPT Provider Note   CSN: [id number] Arrival date & time: 04/30/21  0915     History  Chief Complaint  Patient presents with   Lytle Michaels    Alexandria [name] is a 86 y.o. female.  HPI  86 year old female with past medical history of HTN, HLD, CHF, COPD on chronic oxygen, DM who uses a walker at baseline presents emergency department mechanical fall.  Patient states she was trying to go up a step when she lost her balance and fell down onto her bilateral knees and right elbow.  She does not believe that she hit her head but she is complaining of a mild headache, no neck pain.  No reported loss of consciousness.  Patient is not on a blood thinning medication.  Currently complaining mainly of right knee and right elbow pain.  Otherwise in her usual state of health.  Home Medications Prior to Admission medications   Medication Sig Start Date End Date Taking? Authorizing Provider  albuterol (PROAIR HFA) 108 (90 Base) MCG/ACT inhaler Inhale 2 puffs into the lungs every 6 (six) hours as needed for wheezing or shortness of breath. INHALE 2 PUFFS INTO THE LUNGS EVERY 6 HOURS AS NEEDED FOR WHEEZING OR SHORTNESS OF BREATH 04/19/18   Chesley Mires, MD  budesonide-formoterol [facility]) 80-4.5 MCG/ACT inhaler Inhale 2 puffs into the lungs 2 (two) times daily. 02/03/21   Chesley Mires, MD  busPIRone (BUSPAR) 15 MG tablet Take 15 mg by mouth 2 (two) times daily. 05/21/20   [provider]  Cholecalciferol (VITAMIN D) 50 MCG (2000 UT) tablet Take 1 tablet (2,000 Units total) by mouth daily. 06/27/18   Tonia Ghent, MD  Coenzyme Q-10 200 MG CAPS Take 200 mg by mouth daily.    [provider]  Cyanocobalamin (B-12) 5000 MCG CAPS Take 5,000 mcg by mouth daily.    [provider]  esomeprazole (NEXIUM) 40 MG capsule Take 40 mg by mouth every other day.     [provider]  ezetimibe (ZETIA) 10 MG tablet TAKE 1 TABLET BY MOUTH EVERYDAY  AT BEDTIME 04/14/20   Tonia Ghent, MD  fexofenadine (ALLEGRA) 180 MG tablet Take 180 mg by mouth as needed.     [provider]  FLUoxetine (PROZAC) 20 MG tablet Take 20 mg by mouth 2 (two) times daily.    [provider]  fluticasone (FLONASE) 50 MCG/ACT nasal spray USE 2 SPRAYS INTO THE NOSE EVERY 12 HOURS AS NEEDED FOR STUFFY NOSE 03/10/20   Tonia Ghent, MD  furosemide (LASIX) 20 MG tablet TAKE 1 TABLET (20 MG TOTAL) BY MOUTH DAILY AS NEEDED FOR FLUID. 02/18/21   Minna Merritts, MD  glucose blood (ONETOUCH VERIO) test strip Use as instructed to check sugar 2-3x time daily 02/21/17   Philemon Kingdom, MD  Iron, Ferrous Sulfate, 325 (65 Fe) MG TABS Take 325 mg by mouth every other day. 09/14/20   Tonia Ghent, MD  metFORMIN (GLUCOPHAGE) 500 MG tablet Take 1 tablet (500 mg total) by mouth daily with supper. 04/29/20   Philemon Kingdom, MD  midodrine (PROAMATINE) 5 MG tablet TAKE 1 TABLET (5 MG TOTAL) BY MOUTH 3 (THREE) TIMES DAILY WITH MEALS AS NEEDED. 04/27/21   Minna Merritts, MD  nitroGLYCERIN (NITROSTAT) 0.4 MG SL tablet Place 1 tablet (0.4 mg total) under the tongue every 5 (five) minutes as needed for chest pain. 06/27/18   Tonia Ghent, MD  [place]  USE TO CHECK SUGAR 1 TIME DAILY 09/28/17   Philemon Kingdom, MD  [facility] VERIO test strip USE AS INSTRUCTED TO CHECK SUGAR 1 TIME DAILY 11/23/19   Philemon Kingdom, MD  predniSONE (DELTASONE) 20 MG tablet Take 1 tablet (20 mg total) by mouth 2 (two) times daily with a meal for 3 days, THEN 1 tablet (20 mg total) daily with breakfast for 3 days. 04/27/21 05/03/21  Michela Pitcher, NP  rosuvastatin (CRESTOR) 10 MG tablet TAKE 1 TABLET (10 MG TOTAL) BY MOUTH EVERY OTHER DAY. 08/23/19   Tonia Ghent, MD  Spacer/Aero Chamber Mouthpiece MISC Use with  inhaler as needed.  J44.9 09/30/17   Tonia Ghent, MD  SYNTHROID 150 MCG tablet TAKE 1 TABLET BY MOUTH DAILY BEFORE BREAKFAST. 11/22/20    Philemon Kingdom, MD      Allergies    Antihistamines, diphenhydramine-type; Sulfonamide derivatives; Incruse ellipta [umeclidinium bromide]; Clarithromycin; Codeine; Lyrica [pregabalin]; Spiriva handihaler [tiotropium bromide monohydrate]; and Vraylar [cariprazine]    Review of Systems   Review of Systems  Constitutional:  Negative for fever.  Respiratory:  Negative for shortness of breath.   Cardiovascular:  Negative for chest pain.  Gastrointestinal:  Negative for abdominal pain, diarrhea and vomiting.  Musculoskeletal:  Negative for back pain and neck pain.       Bilateral knee and right elbow pain  Skin:  Negative for rash.  Neurological:  Positive for headaches.   Physical Exam Updated Vital Signs BP (!) 146/49    Pulse 74    Temp 97.9 F (36.6 C) (Oral)    Resp (!) 22    LMP  (LMP Unknown)    SpO2 100%  Physical Exam Vitals and nursing note reviewed.  Constitutional:      Appearance: Normal appearance.  HENT:     Head: Normocephalic.     Mouth/Throat:     Mouth: Mucous membranes are moist.  Eyes:     Pupils: Pupils are equal, round, and reactive to light.  Cardiovascular:     Rate and Rhythm: Normal rate.  Pulmonary:     Effort: Pulmonary effort is normal. No respiratory distress.  Abdominal:     Palpations: Abdomen is soft.     Tenderness: There is no abdominal tenderness.  Musculoskeletal:     Cervical back: No tenderness.     Comments: Bony tenderness to palpation of the right elbow and right knee.  No hip pain, pelvis is stable, no neck or back pain.   Skin:    General: Skin is warm.     Comments: Approximately 5 inch horizontal linear right knee laceration just below the kneecap, right thigh abrasion, left shin skin tears, right elbow abrasions.    Neurological:     Mental Status: She is alert and oriented to person, place, and time. Mental status is at baseline.  Psychiatric:        Mood and Affect: Mood normal.    ED Results / Procedures / Treatments    Labs (all labs ordered are listed, but only abnormal results are displayed) Labs Reviewed - No data to display  EKG None  Radiology DG Elbow Complete Right  Result Date: 04/30/2021 CLINICAL DATA:  An 86 year old female presents for evaluation of elbow pain following fall. EXAM: RIGHT ELBOW - COMPLETE 3+ VIEW COMPARISON:  None FINDINGS: Soft tissue swelling about the lateral elbow. No joint effusion. No visible fracture. Degenerative changes about the elbow. IMPRESSION: No visible fracture or dislocation. Soft tissue swelling about the  lateral elbow. Electronically Signed   By: Zetta Bills M.D.   On: 04/30/2021 11:21   CT Head Wo Contrast  Result Date: 04/30/2021 CLINICAL DATA:  Headache, fall with possible head injury, fell with walker after catching it on a step while walking upstairs, denies loss of consciousness EXAM: CT HEAD WITHOUT CONTRAST TECHNIQUE: Contiguous axial images were obtained from the base of the skull through the vertex without intravenous contrast. RADIATION DOSE REDUCTION: This exam was performed according to the departmental dose-optimization program which includes automated exposure control, adjustment of the mA and/or kV according to patient size and/or use of iterative reconstruction technique. COMPARISON:  10/01/2016 FINDINGS: Brain: Generalized atrophy. Normal ventricular morphology. No midline shift or mass effect. Small vessel chronic ischemic changes of deep cerebral white matter. No intracranial hemorrhage, mass lesion, evidence of acute infarction, or extra-axial fluid collection. Vascular: No hyperdense vessels. Atherosclerotic calcification of internal carotid arteries at skull base Skull: Demineralized but intact Sinuses/Orbits: Clear Other: N/A IMPRESSION: Atrophy with small vessel chronic ischemic changes of deep cerebral white matter. No acute intracranial abnormalities. Electronically Signed   By: Lavonia Dana M.D.   On: 04/30/2021 11:25   DG Knee Complete 4  Views Right  Result Date: 04/30/2021 CLINICAL DATA:  Fall from walker.  Laceration on right knee. EXAM: RIGHT KNEE - COMPLETE 4+ VIEW COMPARISON:  Knee radiograph 10/01/2016 FINDINGS: No evidence of fracture or dislocation. Small suprapatellar joint effusion. Severe osteoarthritis in the medial compartment and moderate osteoarthritis in the lateral compartment. No focal osseous lesion. Mild soft tissue swelling and small focus of subcutaneous air overlying the patella, consistent with history of laceration to the right knee. No radiopaque foreign body. IMPRESSION: No acute osseous abnormality.  Small suprapatellar joint effusion. Mild soft tissue swelling and small focus of subcutaneous air overlying the patella, consistent with history of laceration to the right knee. Advanced osteoarthritis. Electronically Signed   By: Ileana Roup M.D.   On: 04/30/2021 11:21    Procedures .Marland KitchenLaceration Repair  Date/Time: 04/30/2021 12:44 PM Performed by: Lorelle Gibbs, DO Authorized by: Lorelle Gibbs, DO   Consent:    Consent obtained:  Verbal   Consent given by:  Patient   Risks discussed:  Infection, need for additional repair, poor cosmetic result and poor wound healing   Alternatives discussed:  No treatment Universal protocol:    Imaging studies available: yes     Patient identity confirmed:  Verbally with patient and arm band Anesthesia:    Anesthesia method:  Local infiltration   Local anesthetic:  Lidocaine 1% w/o epi Laceration details:    Location:  Leg   Leg location:  R lower leg   Length (cm):  8 Pre-procedure details:    Preparation:  Patient was prepped and draped in usual sterile fashion and imaging obtained to evaluate for foreign bodies Exploration:    Hemostasis achieved with:  Direct pressure Treatment:    Area cleansed with:  Saline   Amount of cleaning:  Extensive   Irrigation solution:  Sterile saline   Irrigation volume:  1L   Irrigation method:  Pressure wash    Debridement:  Minimal Skin repair:    Repair method:  Sutures   Suture size:  3-0   Suture material:  Prolene   Suture technique:  Simple interrupted   Number of sutures:  11 Approximation:    Approximation:  Close Repair type:    Repair type:  Intermediate Post-procedure details:    Dressing:  Antibiotic ointment,  non-adherent dressing and adhesive bandage   Procedure completion:  Tolerated Debridement  Date/Time: 04/30/2021 12:45 PM Performed by: Lorelle Gibbs, DO Authorized by: Lorelle Gibbs, DO  Consent: Verbal consent obtained. Consent given by: patient Patient identity confirmed: verbally with patient and arm band Preparation: Patient was prepped and draped in the usual sterile fashion. Local anesthesia used: no  Anesthesia: Local anesthesia used: no  Sedation: Patient sedated: no  Patient tolerance: patient tolerated the procedure well with no immediate complications Comments: Left shin, right thigh and right elbow skin tears.  Date of old skin was debrided/incised with clean edges and irrigation      Medications Ordered in ED Medications  Tdap (BOOSTRIX) injection 0.5 mL (has no administration in time range)  bacitracin ointment (has no administration in time range)  lidocaine-EPINEPHrine (XYLOCAINE W/EPI) 2 %-1:200000 (PF) injection 20 mL (20 mLs Infiltration Given by Other 04/30/21 1138)    ED Course/ Medical Decision Making/ A&P                           Medical Decision Making Amount and/or Complexity of Data Reviewed Radiology: ordered.  Risk OTC drugs. Prescription drug management.   86 year old female presents emergency department with mechanical fall.  Sustained multiple abrasions, right elbow injury and right knee injury with laceration.  Unclear if the patient hit her head but she is complaining of a mild headache, no anticoagulation.  She is otherwise neuro intact and states to be at her baseline health.  She has home aides but  otherwise lives home alone, ambulates with a walker.  CT and x-ray imaging shows no fracture.  Right leg laceration was repaired.  Skin tears were debrided and dressed with antibiotic ointment.  Tetanus was updated.  Patient was placed in a knee immobilizer to aid in right knee laceration healing.  She ambulated independently with a walker without any difficulty.  Updated the daughter who is her designated health guardian.  She expresses her concern for safety at home and has interest in rehab.  I explored this with the patient and the patient is very adamant that she wants to go home.  She understands the safety concerns.  She states that she has a sister from [place], to stay with her to help.  Patient politely declines rehab.  Patient will be discharged home with her home aide.  Patient at this time appears safe and stable for discharge and close outpatient follow up. Discharge plan and strict return to ED precautions discussed, patient verbalizes understanding and agreement.        Final Clinical Impression(s) / ED Diagnoses Final diagnoses:  None    Rx / DC Orders ED Discharge Orders     None         Lorelle Gibbs, DO 04/30/21 1443

## 2021-05-01 ENCOUNTER — Encounter: Payer: Self-pay | Admitting: Internal Medicine

## 2021-05-01 DIAGNOSIS — E782 Mixed hyperlipidemia: Secondary | ICD-10-CM | POA: Diagnosis not present

## 2021-05-01 DIAGNOSIS — S71101A Unspecified open wound, right thigh, initial encounter: Secondary | ICD-10-CM | POA: Diagnosis not present

## 2021-05-01 DIAGNOSIS — K219 Gastro-esophageal reflux disease without esophagitis: Secondary | ICD-10-CM | POA: Diagnosis not present

## 2021-05-01 DIAGNOSIS — R296 Repeated falls: Secondary | ICD-10-CM | POA: Diagnosis not present

## 2021-05-01 DIAGNOSIS — S81001A Unspecified open wound, right knee, initial encounter: Secondary | ICD-10-CM | POA: Diagnosis not present

## 2021-05-01 DIAGNOSIS — R1312 Dysphagia, oropharyngeal phase: Secondary | ICD-10-CM | POA: Diagnosis not present

## 2021-05-01 DIAGNOSIS — M6281 Muscle weakness (generalized): Secondary | ICD-10-CM | POA: Diagnosis not present

## 2021-05-01 DIAGNOSIS — E038 Other specified hypothyroidism: Secondary | ICD-10-CM | POA: Diagnosis not present

## 2021-05-01 DIAGNOSIS — S81802A Unspecified open wound, left lower leg, initial encounter: Secondary | ICD-10-CM | POA: Diagnosis not present

## 2021-05-01 DIAGNOSIS — I5042 Chronic combined systolic (congestive) and diastolic (congestive) heart failure: Secondary | ICD-10-CM | POA: Diagnosis not present

## 2021-05-01 DIAGNOSIS — F411 Generalized anxiety disorder: Secondary | ICD-10-CM | POA: Diagnosis not present

## 2021-05-01 DIAGNOSIS — J441 Chronic obstructive pulmonary disease with (acute) exacerbation: Secondary | ICD-10-CM | POA: Diagnosis not present

## 2021-05-01 DIAGNOSIS — Z9181 History of falling: Secondary | ICD-10-CM | POA: Diagnosis not present

## 2021-05-01 DIAGNOSIS — E119 Type 2 diabetes mellitus without complications: Secondary | ICD-10-CM | POA: Diagnosis not present

## 2021-05-01 DIAGNOSIS — I110 Hypertensive heart disease with heart failure: Secondary | ICD-10-CM | POA: Diagnosis not present

## 2021-05-01 DIAGNOSIS — I10 Essential (primary) hypertension: Secondary | ICD-10-CM | POA: Diagnosis not present

## 2021-05-01 DIAGNOSIS — R278 Other lack of coordination: Secondary | ICD-10-CM | POA: Diagnosis not present

## 2021-05-01 DIAGNOSIS — R2681 Unsteadiness on feet: Secondary | ICD-10-CM | POA: Diagnosis not present

## 2021-05-01 DIAGNOSIS — D508 Other iron deficiency anemias: Secondary | ICD-10-CM | POA: Diagnosis not present

## 2021-05-01 DIAGNOSIS — J449 Chronic obstructive pulmonary disease, unspecified: Secondary | ICD-10-CM | POA: Diagnosis not present

## 2021-05-01 NOTE — Telephone Encounter (Signed)
Needs OV for suture removal and we can discuss her options at that point.  Thanks.

## 2021-05-01 NOTE — Telephone Encounter (Signed)
Needs eval for suture removal 8-10 days after suture placement.  I can fill out FL2 for patient but patient and family would need to check on facility availability.  I can't do the bed search.  And patient would need to consent for placement.

## 2021-05-01 NOTE — Telephone Encounter (Signed)
Called daughter back. She is on he way to get her addmited into [facility] at this time. She declined making appointment  hopes that they can remove sutures there. She would like to have FL2 filled out as soon as possible incase it is needed. I have printed form and placed in your box. Informed daughter that there is no way it will be completed today and we will contact her as soon as done.

## 2021-05-01 NOTE — Telephone Encounter (Signed)
Called spoke to daughter she is very upset with the way that she was discharged from ED. She is wanting to get her placed in rehab. She wants to get this done ASAP. She is not able to take care of herself. And when do we need to make appointment for suture removal.

## 2021-05-02 ENCOUNTER — Encounter: Payer: Self-pay | Admitting: Internal Medicine

## 2021-05-04 ENCOUNTER — Encounter: Payer: Self-pay | Admitting: Family Medicine

## 2021-05-04 ENCOUNTER — Other Ambulatory Visit: Payer: Self-pay | Admitting: Family Medicine

## 2021-05-04 DIAGNOSIS — E038 Other specified hypothyroidism: Secondary | ICD-10-CM | POA: Diagnosis not present

## 2021-05-04 DIAGNOSIS — E782 Mixed hyperlipidemia: Secondary | ICD-10-CM | POA: Diagnosis not present

## 2021-05-04 DIAGNOSIS — I10 Essential (primary) hypertension: Secondary | ICD-10-CM | POA: Diagnosis not present

## 2021-05-04 DIAGNOSIS — J441 Chronic obstructive pulmonary disease with (acute) exacerbation: Secondary | ICD-10-CM | POA: Diagnosis not present

## 2021-05-04 DIAGNOSIS — I110 Hypertensive heart disease with heart failure: Secondary | ICD-10-CM | POA: Diagnosis not present

## 2021-05-04 DIAGNOSIS — D649 Anemia, unspecified: Secondary | ICD-10-CM

## 2021-05-04 DIAGNOSIS — F411 Generalized anxiety disorder: Secondary | ICD-10-CM | POA: Diagnosis not present

## 2021-05-04 DIAGNOSIS — D508 Other iron deficiency anemias: Secondary | ICD-10-CM | POA: Diagnosis not present

## 2021-05-04 DIAGNOSIS — R296 Repeated falls: Secondary | ICD-10-CM | POA: Diagnosis not present

## 2021-05-04 DIAGNOSIS — K219 Gastro-esophageal reflux disease without esophagitis: Secondary | ICD-10-CM | POA: Diagnosis not present

## 2021-05-04 DIAGNOSIS — E119 Type 2 diabetes mellitus without complications: Secondary | ICD-10-CM | POA: Diagnosis not present

## 2021-05-04 DIAGNOSIS — I5042 Chronic combined systolic (congestive) and diastolic (congestive) heart failure: Secondary | ICD-10-CM | POA: Diagnosis not present

## 2021-05-04 MED ORDER — IRON (FERROUS SULFATE) 325 (65 FE) MG PO TABS: 325.0000 mg | ORAL_TABLET | Freq: Every day | ORAL | 3 refills | Status: DC

## 2021-05-04 NOTE — Telephone Encounter (Signed)
I will work on the hard copy as soon as I can.  Thanks.

## 2021-05-04 NOTE — Telephone Encounter (Signed)
Please attach a new med list to the [facility] form that I am completing.  I sent a note to family about picking up the FL2 after 3pm today.  Thanks.

## 2021-05-06 ENCOUNTER — Encounter: Payer: Self-pay | Admitting: Internal Medicine

## 2021-05-06 DIAGNOSIS — J441 Chronic obstructive pulmonary disease with (acute) exacerbation: Secondary | ICD-10-CM | POA: Diagnosis not present

## 2021-05-06 DIAGNOSIS — E119 Type 2 diabetes mellitus without complications: Secondary | ICD-10-CM | POA: Diagnosis not present

## 2021-05-06 DIAGNOSIS — K219 Gastro-esophageal reflux disease without esophagitis: Secondary | ICD-10-CM | POA: Diagnosis not present

## 2021-05-06 DIAGNOSIS — E038 Other specified hypothyroidism: Secondary | ICD-10-CM | POA: Diagnosis not present

## 2021-05-06 DIAGNOSIS — I5042 Chronic combined systolic (congestive) and diastolic (congestive) heart failure: Secondary | ICD-10-CM | POA: Diagnosis not present

## 2021-05-06 DIAGNOSIS — I10 Essential (primary) hypertension: Secondary | ICD-10-CM | POA: Diagnosis not present

## 2021-05-06 DIAGNOSIS — D508 Other iron deficiency anemias: Secondary | ICD-10-CM | POA: Diagnosis not present

## 2021-05-06 DIAGNOSIS — E782 Mixed hyperlipidemia: Secondary | ICD-10-CM | POA: Diagnosis not present

## 2021-05-06 DIAGNOSIS — F411 Generalized anxiety disorder: Secondary | ICD-10-CM | POA: Diagnosis not present

## 2021-05-06 DIAGNOSIS — R296 Repeated falls: Secondary | ICD-10-CM | POA: Diagnosis not present

## 2021-05-07 DIAGNOSIS — E038 Other specified hypothyroidism: Secondary | ICD-10-CM | POA: Diagnosis not present

## 2021-05-07 DIAGNOSIS — S81802A Unspecified open wound, left lower leg, initial encounter: Secondary | ICD-10-CM | POA: Diagnosis not present

## 2021-05-07 DIAGNOSIS — E119 Type 2 diabetes mellitus without complications: Secondary | ICD-10-CM | POA: Diagnosis not present

## 2021-05-07 DIAGNOSIS — R296 Repeated falls: Secondary | ICD-10-CM | POA: Diagnosis not present

## 2021-05-07 DIAGNOSIS — E782 Mixed hyperlipidemia: Secondary | ICD-10-CM | POA: Diagnosis not present

## 2021-05-07 DIAGNOSIS — I10 Essential (primary) hypertension: Secondary | ICD-10-CM | POA: Diagnosis not present

## 2021-05-07 DIAGNOSIS — K219 Gastro-esophageal reflux disease without esophagitis: Secondary | ICD-10-CM | POA: Diagnosis not present

## 2021-05-07 DIAGNOSIS — J449 Chronic obstructive pulmonary disease, unspecified: Secondary | ICD-10-CM | POA: Diagnosis not present

## 2021-05-07 DIAGNOSIS — S81001A Unspecified open wound, right knee, initial encounter: Secondary | ICD-10-CM | POA: Diagnosis not present

## 2021-05-07 DIAGNOSIS — S71101A Unspecified open wound, right thigh, initial encounter: Secondary | ICD-10-CM | POA: Diagnosis not present

## 2021-05-07 DIAGNOSIS — I110 Hypertensive heart disease with heart failure: Secondary | ICD-10-CM | POA: Diagnosis not present

## 2021-05-07 DIAGNOSIS — D508 Other iron deficiency anemias: Secondary | ICD-10-CM | POA: Diagnosis not present

## 2021-05-07 DIAGNOSIS — J441 Chronic obstructive pulmonary disease with (acute) exacerbation: Secondary | ICD-10-CM | POA: Diagnosis not present

## 2021-05-07 DIAGNOSIS — I5042 Chronic combined systolic (congestive) and diastolic (congestive) heart failure: Secondary | ICD-10-CM | POA: Diagnosis not present

## 2021-05-08 ENCOUNTER — Encounter: Payer: Self-pay | Admitting: Internal Medicine

## 2021-05-08 ENCOUNTER — Ambulatory Visit: Payer: Medicare Other | Admitting: Nurse Practitioner

## 2021-05-08 DIAGNOSIS — E038 Other specified hypothyroidism: Secondary | ICD-10-CM | POA: Diagnosis not present

## 2021-05-08 DIAGNOSIS — R296 Repeated falls: Secondary | ICD-10-CM | POA: Diagnosis not present

## 2021-05-08 DIAGNOSIS — J441 Chronic obstructive pulmonary disease with (acute) exacerbation: Secondary | ICD-10-CM | POA: Diagnosis not present

## 2021-05-08 DIAGNOSIS — F411 Generalized anxiety disorder: Secondary | ICD-10-CM | POA: Diagnosis not present

## 2021-05-08 DIAGNOSIS — E119 Type 2 diabetes mellitus without complications: Secondary | ICD-10-CM | POA: Diagnosis not present

## 2021-05-08 DIAGNOSIS — I10 Essential (primary) hypertension: Secondary | ICD-10-CM | POA: Diagnosis not present

## 2021-05-08 DIAGNOSIS — K219 Gastro-esophageal reflux disease without esophagitis: Secondary | ICD-10-CM | POA: Diagnosis not present

## 2021-05-08 DIAGNOSIS — I5042 Chronic combined systolic (congestive) and diastolic (congestive) heart failure: Secondary | ICD-10-CM | POA: Diagnosis not present

## 2021-05-08 DIAGNOSIS — E782 Mixed hyperlipidemia: Secondary | ICD-10-CM | POA: Diagnosis not present

## 2021-05-19 ENCOUNTER — Telehealth: Payer: Self-pay

## 2021-05-19 NOTE — Telephone Encounter (Signed)
[facility] Night - Client ?Nonclinical Telephone Record  ?[facility]? ?Client [place] Night - Client ?Client Site [place] ?Provider Renford Dills - MD ?Contact Type Call ?Who Is Calling Patient / Member / Family / Caregiver ?Caller Name Adah Salvage ?Caller Phone Number [phone] ?Patient Name Alexandria [name] [birth year] ?Patient DOB [birth year] ?Call Type Message Only Information Provided ?Reason for Call Request for General Office Information ?Initial Comment Caller states her mom has an appointment with [name]. ?Additional Comment She is unable to cancel online, needs a return call asap please. ?Disp. Time Disposition Final User ?05/18/2021 5:11:44 PM General Information Provided Yes Darreld Mclean ?Call Closed By: Darreld Mclean ?Transaction Date/Time: 05/18/2021 5:07:51 PM (ET ?

## 2021-05-21 ENCOUNTER — Ambulatory Visit: Payer: Medicare Other | Admitting: Family Medicine

## 2021-05-22 ENCOUNTER — Telehealth: Payer: Self-pay | Admitting: Family Medicine

## 2021-05-22 DIAGNOSIS — Z85118 Personal history of other malignant neoplasm of bronchus and lung: Secondary | ICD-10-CM | POA: Diagnosis not present

## 2021-05-22 DIAGNOSIS — E782 Mixed hyperlipidemia: Secondary | ICD-10-CM | POA: Diagnosis not present

## 2021-05-22 DIAGNOSIS — Z79899 Other long term (current) drug therapy: Secondary | ICD-10-CM | POA: Diagnosis not present

## 2021-05-22 DIAGNOSIS — J441 Chronic obstructive pulmonary disease with (acute) exacerbation: Secondary | ICD-10-CM | POA: Diagnosis not present

## 2021-05-22 DIAGNOSIS — Z6835 Body mass index (BMI) 35.0-35.9, adult: Secondary | ICD-10-CM | POA: Diagnosis not present

## 2021-05-22 DIAGNOSIS — Z90710 Acquired absence of both cervix and uterus: Secondary | ICD-10-CM | POA: Diagnosis not present

## 2021-05-22 DIAGNOSIS — E1142 Type 2 diabetes mellitus with diabetic polyneuropathy: Secondary | ICD-10-CM | POA: Diagnosis not present

## 2021-05-22 DIAGNOSIS — I5042 Chronic combined systolic (congestive) and diastolic (congestive) heart failure: Secondary | ICD-10-CM | POA: Diagnosis not present

## 2021-05-22 DIAGNOSIS — I110 Hypertensive heart disease with heart failure: Secondary | ICD-10-CM | POA: Diagnosis not present

## 2021-05-22 DIAGNOSIS — E1136 Type 2 diabetes mellitus with diabetic cataract: Secondary | ICD-10-CM | POA: Diagnosis not present

## 2021-05-22 DIAGNOSIS — E669 Obesity, unspecified: Secondary | ICD-10-CM | POA: Diagnosis not present

## 2021-05-22 DIAGNOSIS — Z9049 Acquired absence of other specified parts of digestive tract: Secondary | ICD-10-CM | POA: Diagnosis not present

## 2021-05-22 DIAGNOSIS — Z7951 Long term (current) use of inhaled steroids: Secondary | ICD-10-CM | POA: Diagnosis not present

## 2021-05-22 DIAGNOSIS — E038 Other specified hypothyroidism: Secondary | ICD-10-CM | POA: Diagnosis not present

## 2021-05-22 DIAGNOSIS — K219 Gastro-esophageal reflux disease without esophagitis: Secondary | ICD-10-CM | POA: Diagnosis not present

## 2021-05-22 DIAGNOSIS — Z9181 History of falling: Secondary | ICD-10-CM | POA: Diagnosis not present

## 2021-05-22 DIAGNOSIS — R32 Unspecified urinary incontinence: Secondary | ICD-10-CM | POA: Diagnosis not present

## 2021-05-22 DIAGNOSIS — G4733 Obstructive sleep apnea (adult) (pediatric): Secondary | ICD-10-CM | POA: Diagnosis not present

## 2021-05-22 DIAGNOSIS — F32A Depression, unspecified: Secondary | ICD-10-CM | POA: Diagnosis not present

## 2021-05-22 DIAGNOSIS — D509 Iron deficiency anemia, unspecified: Secondary | ICD-10-CM | POA: Diagnosis not present

## 2021-05-22 DIAGNOSIS — S81812D Laceration without foreign body, left lower leg, subsequent encounter: Secondary | ICD-10-CM | POA: Diagnosis not present

## 2021-05-22 DIAGNOSIS — Z9981 Dependence on supplemental oxygen: Secondary | ICD-10-CM | POA: Diagnosis not present

## 2021-05-22 DIAGNOSIS — M7550 Bursitis of unspecified shoulder: Secondary | ICD-10-CM | POA: Diagnosis not present

## 2021-05-22 DIAGNOSIS — Z87891 Personal history of nicotine dependence: Secondary | ICD-10-CM | POA: Diagnosis not present

## 2021-05-22 DIAGNOSIS — F411 Generalized anxiety disorder: Secondary | ICD-10-CM | POA: Diagnosis not present

## 2021-05-22 NOTE — Telephone Encounter (Signed)
Home Health verbal orders ?Caller Name:Gracie ?Agency Name: [facility] ? ?Callback number: [phone] ? ?Requesting OT/PT/Skilled nursing/Social Work/Speech: ? ?Reason:PT ? ?Frequency:1 wk for 1 wk, 2 wks for 4 wks, 1 wk for 4 wks ? ?Please forward to [facility] pool or providers CMA  ?

## 2021-05-23 DIAGNOSIS — I110 Hypertensive heart disease with heart failure: Secondary | ICD-10-CM | POA: Diagnosis not present

## 2021-05-23 DIAGNOSIS — F32A Depression, unspecified: Secondary | ICD-10-CM | POA: Diagnosis not present

## 2021-05-23 DIAGNOSIS — J441 Chronic obstructive pulmonary disease with (acute) exacerbation: Secondary | ICD-10-CM | POA: Diagnosis not present

## 2021-05-23 DIAGNOSIS — F411 Generalized anxiety disorder: Secondary | ICD-10-CM | POA: Diagnosis not present

## 2021-05-23 DIAGNOSIS — S81812D Laceration without foreign body, left lower leg, subsequent encounter: Secondary | ICD-10-CM | POA: Diagnosis not present

## 2021-05-23 DIAGNOSIS — I5042 Chronic combined systolic (congestive) and diastolic (congestive) heart failure: Secondary | ICD-10-CM | POA: Diagnosis not present

## 2021-05-24 NOTE — Telephone Encounter (Signed)
Please give the order.  Thanks.   

## 2021-05-25 ENCOUNTER — Telehealth: Payer: Self-pay | Admitting: Family Medicine

## 2021-05-25 NOTE — Telephone Encounter (Signed)
Verbal orders have been given to Gracie ?

## 2021-05-25 NOTE — Telephone Encounter (Signed)
Home Health verbal orders ?Caller Name: [name]  ?Agency Name:  [facility]  ? ?Callback number: [phone] ? ?Requesting Verbal orders for Nursing ? ?Reason: wound L leg ? ?Frequency: 1x per wk 9 weeks ?2 PRNs ? ?**Also--please confirm oxygen settings ? ?Please forward to providers CMA  ?

## 2021-05-26 ENCOUNTER — Other Ambulatory Visit: Payer: Self-pay

## 2021-05-26 ENCOUNTER — Ambulatory Visit (INDEPENDENT_AMBULATORY_CARE_PROVIDER_SITE_OTHER): Payer: Medicare Other | Admitting: Family Medicine

## 2021-05-26 ENCOUNTER — Encounter: Payer: Self-pay | Admitting: Family Medicine

## 2021-05-26 DIAGNOSIS — F4321 Adjustment disorder with depressed mood: Secondary | ICD-10-CM | POA: Diagnosis not present

## 2021-05-26 DIAGNOSIS — Z87828 Personal history of other (healed) physical injury and trauma: Secondary | ICD-10-CM | POA: Diagnosis not present

## 2021-05-26 MED ORDER — ALPRAZOLAM 0.25 MG PO TABS: 0.2500 mg | ORAL_TABLET | Freq: Two times a day (BID) | ORAL | 0 refills | Status: DC | PRN

## 2021-05-26 NOTE — Progress Notes (Signed)
This visit occurred during the SARS-CoV-2 public health emergency.  Safety protocols were in place, including screening questions prior to the visit, additional usage of staff PPE, and extensive cleaning of exam room while observing appropriate contact time as indicated for disinfecting solutions. ? ?Her husband recently passed, condolences offered.  Stressors d/w pt.  Remer Macho is pending for his weekend.  She has help at home, others spending the night in the meantime.  Has help cleaning the home.  She has a driver.   ? ?She had fallen, had ER eval, then went to [facility].  She had PT there.  She got home after 1 week.  Has been home about 3 weeks.   ? ?Current meds d/w pt.  She had improved on her meds prior.  Recently with higher stress noted.  She clearly did not tolerate Vraylar and improved off that medication. ? ?She has seen [name] prev with psych.  She reports that her appointment with Dr. Arlyn Leak didn't go well and wasn't helpful.   ? ?Meds, vitals, and allergies reviewed.  ? ?ROS: Per HPI unless specifically indicated in ROS section  ? ?GEN: nad, alert and oriented, on O2 at baseline.  In wheelchair ?HEENT: ncat ?NECK: supple w/o LA ?CV: rrr.  ?PULM: ctab, no inc wob ?ABD: soft, +bs ?EXT: no edema ?SKIN: no acute rash ?Previous laceration sites noted on the anterior shins. ?Steri strips R shin at previous laceration repair.  Strips removed without complication.  She has good tissue apposition and healing locally. ?2 superficial lacs bandaged L shin.  She still has those dressed.  There is only minor superficial residual abrasion at this point.  Redressed and covered with [place] at office visit.  Tolerated well. ?None of the leg lesions look infected. ?

## 2021-05-26 NOTE — Telephone Encounter (Signed)
Called and gave verbal orders to [facility] and also confirmed O2 with her.  ?

## 2021-05-26 NOTE — Patient Instructions (Signed)
Use the xanax only if needed.  Sedation caution.  I will be thinking about you and your family.  ?I'll update R Bridges.  ?Take care.  Glad to see you. ?

## 2021-05-26 NOTE — Telephone Encounter (Signed)
Per records, she is using 2 L of oxygen via nasal cannula.  Please give the orders.   Thanks.  ? ?

## 2021-05-27 DIAGNOSIS — F4321 Adjustment disorder with depressed mood: Secondary | ICD-10-CM | POA: Insufficient documentation

## 2021-05-27 DIAGNOSIS — I5042 Chronic combined systolic (congestive) and diastolic (congestive) heart failure: Secondary | ICD-10-CM | POA: Diagnosis not present

## 2021-05-27 DIAGNOSIS — S81812D Laceration without foreign body, left lower leg, subsequent encounter: Secondary | ICD-10-CM | POA: Diagnosis not present

## 2021-05-27 DIAGNOSIS — F411 Generalized anxiety disorder: Secondary | ICD-10-CM | POA: Diagnosis not present

## 2021-05-27 DIAGNOSIS — F32A Depression, unspecified: Secondary | ICD-10-CM | POA: Diagnosis not present

## 2021-05-27 DIAGNOSIS — Z87828 Personal history of other (healed) physical injury and trauma: Secondary | ICD-10-CM | POA: Insufficient documentation

## 2021-05-27 DIAGNOSIS — I110 Hypertensive heart disease with heart failure: Secondary | ICD-10-CM | POA: Diagnosis not present

## 2021-05-27 DIAGNOSIS — J441 Chronic obstructive pulmonary disease with (acute) exacerbation: Secondary | ICD-10-CM | POA: Diagnosis not present

## 2021-05-27 NOTE — Assessment & Plan Note (Signed)
Healing, see above.  Continue routine dressing on the superficial abrasions on the left shin and she can update me as needed. ?

## 2021-05-27 NOTE — Assessment & Plan Note (Signed)
Discussed options.  Mood previously improved with BuSpar and fluoxetine.  She did not tolerate Vraylar.  Reasonable to use alprazolam only as needed with sedation caution given the pending funeral/stress events that she anticipates.  She has a driver and help at home.  Still okay for outpatient follow-up.  We will route this note to [employer] as FYI.  I appreciate the help of all involved.  Still okay for outpatient follow-up. ?

## 2021-05-29 ENCOUNTER — Ambulatory Visit: Payer: BC Managed Care – PPO | Admitting: Cardiovascular Disease

## 2021-06-01 DIAGNOSIS — J441 Chronic obstructive pulmonary disease with (acute) exacerbation: Secondary | ICD-10-CM | POA: Diagnosis not present

## 2021-06-01 DIAGNOSIS — S81812D Laceration without foreign body, left lower leg, subsequent encounter: Secondary | ICD-10-CM | POA: Diagnosis not present

## 2021-06-01 DIAGNOSIS — I5042 Chronic combined systolic (congestive) and diastolic (congestive) heart failure: Secondary | ICD-10-CM | POA: Diagnosis not present

## 2021-06-01 DIAGNOSIS — F32A Depression, unspecified: Secondary | ICD-10-CM | POA: Diagnosis not present

## 2021-06-01 DIAGNOSIS — I110 Hypertensive heart disease with heart failure: Secondary | ICD-10-CM | POA: Diagnosis not present

## 2021-06-01 DIAGNOSIS — F411 Generalized anxiety disorder: Secondary | ICD-10-CM | POA: Diagnosis not present

## 2021-06-02 DIAGNOSIS — F4323 Adjustment disorder with mixed anxiety and depressed mood: Secondary | ICD-10-CM | POA: Diagnosis not present

## 2021-06-04 DIAGNOSIS — F411 Generalized anxiety disorder: Secondary | ICD-10-CM | POA: Diagnosis not present

## 2021-06-04 DIAGNOSIS — S81812D Laceration without foreign body, left lower leg, subsequent encounter: Secondary | ICD-10-CM | POA: Diagnosis not present

## 2021-06-04 DIAGNOSIS — F32A Depression, unspecified: Secondary | ICD-10-CM | POA: Diagnosis not present

## 2021-06-04 DIAGNOSIS — J441 Chronic obstructive pulmonary disease with (acute) exacerbation: Secondary | ICD-10-CM | POA: Diagnosis not present

## 2021-06-04 DIAGNOSIS — I5042 Chronic combined systolic (congestive) and diastolic (congestive) heart failure: Secondary | ICD-10-CM | POA: Diagnosis not present

## 2021-06-04 DIAGNOSIS — I110 Hypertensive heart disease with heart failure: Secondary | ICD-10-CM | POA: Diagnosis not present

## 2021-06-05 ENCOUNTER — Telehealth: Payer: Self-pay | Admitting: Family Medicine

## 2021-06-05 NOTE — Telephone Encounter (Signed)
I was asked about her upcoming appointment.  If there is an issue that needs evaluation, then keep the OV.  If she needs a referral but there is no issue that needs OV, it may be reasonable for me to put in the referral and cancel the OV.   ?

## 2021-06-05 NOTE — Telephone Encounter (Signed)
Called and spoke with patient and she has still been having swelling in her R leg; shes been taking the fluid pills, stockings and keeping leg up when she can. Spoke with Dr. [name] and he advised patient keep appt with Romilda Garret, NP on Monday for eval. Called patient back and advised to keep appt for Monday. Patient verbalized understanding.  ?

## 2021-06-07 ENCOUNTER — Other Ambulatory Visit: Payer: Self-pay | Admitting: Family Medicine

## 2021-06-08 ENCOUNTER — Encounter: Payer: Self-pay | Admitting: Nurse Practitioner

## 2021-06-08 ENCOUNTER — Ambulatory Visit (INDEPENDENT_AMBULATORY_CARE_PROVIDER_SITE_OTHER): Payer: Medicare Other | Admitting: Nurse Practitioner

## 2021-06-08 ENCOUNTER — Ambulatory Visit (INDEPENDENT_AMBULATORY_CARE_PROVIDER_SITE_OTHER)
Admission: RE | Admit: 2021-06-08 | Discharge: 2021-06-08 | Disposition: A | Discharge status: Home / Self Care | Payer: Medicare Other | Source: Ambulatory Visit | Attending: Nurse Practitioner | Admitting: Nurse Practitioner

## 2021-06-08 ENCOUNTER — Ambulatory Visit
Admission: RE | Admit: 2021-06-08 | Discharge: 2021-06-08 | Disposition: A | Discharge status: Home / Self Care | Payer: Medicare Other | Source: Ambulatory Visit | Attending: Nurse Practitioner | Admitting: Nurse Practitioner

## 2021-06-08 ENCOUNTER — Encounter: Payer: Self-pay | Admitting: Internal Medicine

## 2021-06-08 VITALS — BP 132/64 | HR 85 | Ht 65.0 in | Wt 217.8 lb

## 2021-06-08 DIAGNOSIS — F411 Generalized anxiety disorder: Secondary | ICD-10-CM | POA: Diagnosis not present

## 2021-06-08 DIAGNOSIS — J441 Chronic obstructive pulmonary disease with (acute) exacerbation: Secondary | ICD-10-CM | POA: Diagnosis not present

## 2021-06-08 DIAGNOSIS — E039 Hypothyroidism, unspecified: Secondary | ICD-10-CM | POA: Diagnosis not present

## 2021-06-08 DIAGNOSIS — R0602 Shortness of breath: Secondary | ICD-10-CM | POA: Diagnosis not present

## 2021-06-08 DIAGNOSIS — S81812D Laceration without foreign body, left lower leg, subsequent encounter: Secondary | ICD-10-CM | POA: Diagnosis not present

## 2021-06-08 DIAGNOSIS — I5032 Chronic diastolic (congestive) heart failure: Secondary | ICD-10-CM

## 2021-06-08 DIAGNOSIS — M7989 Other specified soft tissue disorders: Secondary | ICD-10-CM

## 2021-06-08 DIAGNOSIS — I5042 Chronic combined systolic (congestive) and diastolic (congestive) heart failure: Secondary | ICD-10-CM | POA: Diagnosis not present

## 2021-06-08 DIAGNOSIS — I110 Hypertensive heart disease with heart failure: Secondary | ICD-10-CM | POA: Diagnosis not present

## 2021-06-08 DIAGNOSIS — R600 Localized edema: Secondary | ICD-10-CM

## 2021-06-08 DIAGNOSIS — F32A Depression, unspecified: Secondary | ICD-10-CM | POA: Diagnosis not present

## 2021-06-08 NOTE — Assessment & Plan Note (Signed)
To the bilateral lower extremities.  Right greater than left.  We will obtain ultrasound of the right lower extremity rule out DVT as it is more swollen than the left.  Patient does have a history of heart failure with recent weight gain of approximately 10 pounds.  Patient is taking Lasix as needed and has not noticed a great improvement.  She is also been using a stocking and elevating legs at night without great improvement.  Pending results ?

## 2021-06-08 NOTE — Assessment & Plan Note (Signed)
Maintained on oral supplementation.  We will recheck today just to make sure thyroid was within balance in regards to lower extremity edema. ?

## 2021-06-08 NOTE — Progress Notes (Signed)
? ?Acute Office Visit ? ?Subjective:  ? ? Patient ID: Alexandria [name], female    DOB: [birth year], 86 y.o.   MRN: [id number] ? ?Chief Complaint  ?Patient presents with  ? Follow-up  ?  Follow up , discuss getting vascular  for legs , swelling in legs , using compression garments and not helping.   ? ? ?HPI ?Patient is in today for leg swelling ?For over a month. States that right leg is worse then the left and she wears a stocking all the time.  She also has as needed Lasix that she uses and states has not made a great difference.  Patient recently sustained a fall and is having home health care come and see her and they said she would likely need a vascular referral. ? ?No history of DVT or PE. States that she is having some increased shortness of breath she will use the albuterol once a day.  Patient is a chronic oxygen user.  She is followed by cardiology Dr. Rockey Situ.  States she has an appointment coming up in the next week or so ? ? ?Past Medical History:  ?Diagnosis Date  ? Allergy, unspecified not elsewhere classified   ? Anxiety   ? Cataract   ? Chronic diastolic congestive heart failure ([place])   ? COPD (chronic obstructive pulmonary disease) ([place]) 03/08/1998  ? PFTs 12/12/2002 FEV 1 1.42 (64%) ratio 58 with no better after B2 and DLCO75%; PFTs 11/20/09 FEV1 1.50 (73%) ratio 50 no better after B2 with DLCO 62%; Hfa 50% 11/20/2009 >75%, 01/13/10 p coaching  ? Depression 12/07/1998  ? Diabetes mellitus type II 02/05/2006  ? Dr. Cruzita Lederer with endo  ? Disorders of bursae and tendons in shoulder region, unspecified   ? Rotator cuff syndrome, right  ? E. coli bacteremia   ? Esophagitis   ? GERD (gastroesophageal reflux disease)   ? History of UTI   ? Hyperlipidemia 10/04/2000  ? Hypertension 03/08/1992  ? Hypothyroidism 03/08/1968  ? Iron deficiency anemia   ? Lung nodule   ? radiation starts 10-08-2019  ? Malignant neoplasm of right upper lobe of lung ([place]) 07/24/2019  ? Obesity   ? NOS  ? OSA (obstructive sleep apnea)   ?  PSG 01/27/10 AHI 13, pt does not know CPAP settings  ? Peripheral neuropathy   ? Likely due to DM per Dr. Erling Cruz  ? Ventral hernia   ? ? ?Past Surgical History:  ?Procedure Laterality Date  ? ABD U/S  03/19/1999  ? Nml x2 foci in liver  ? ADENOSINE MYOVIEW  06/02/2007  ? Nml  ? CARDIOLITE PERSANTINE  08/24/2000  ? Nml  ? CAROTID U/S  08/24/2000  ? 1-39% ICA stenosis  ? CAROTID U/S  06/02/2007  ? No apprec change   ? CARPAL TUNNEL RELEASE  12/1997  ? Right  ? CESAREAN SECTION    ? x2 Breech/ repeat  ? CHOLECYSTECTOMY  1997  ? COLONOSCOPY WITH PROPOFOL N/A 02/12/2016  ? Procedure: COLONOSCOPY WITH PROPOFOL;  Surgeon: Milus Banister, MD;  Location: [facility];  Service: Endoscopy;  Laterality: N/A;  ? CT ABD W & PELVIS WO/W CM    ? Abd hemangiomas of liver, 1 cm R renal cyst  ? DENTAL SURGERY  2016  ? Implants  ? DEXA  07/03/2003  ? Nml  ? ESOPHAGOGASTRODUODENOSCOPY  12/05/1997  ? Nml (due to hoarseness)  ? ESOPHAGOGASTRODUODENOSCOPY (EGD) WITH PROPOFOL N/A 02/12/2016  ? Procedure: ESOPHAGOGASTRODUODENOSCOPY (EGD) WITH PROPOFOL;  Surgeon:  Milus Banister, MD;  Location: [facility];  Service: Endoscopy;  Laterality: N/A;  ? GALLBLADDER SURGERY    ? HERNIA REPAIR  01/24/2009  ? Lap Ventr w/ Lysis of adhesions (Dr. Donne Hazel)  ? knee arthroscopic surgery  years ago  ? right  ? [place]  ? Right, [place]  ? SHOULDER OPEN ROTATOR CUFF REPAIR  02/08/2012  ? Procedure: ROTATOR CUFF REPAIR SHOULDER OPEN;  Surgeon: Magnus Sinning, MD;  Location: [facility];  Service: Orthopedics;  Laterality: Left;  Left Shoulder Open Anterior Acrominectomy Rotator Cuff Repair Open Distal Clavicle Resection ,tissue mend graft, and repair of biceps tendon  ? THUMB RELEASE  12/1997  ? Right  ? TONSILLECTOMY    ? TOTAL ABDOMINAL HYSTERECTOMY  1985  ? Due to dysmennorhea  ? US ECHOCARDIOGRAPHY  06/02/2007  ? ? ?Family History  ?Problem Relation Age of Onset  ? Heart disease Mother   ? Thyroid disease Mother   ? Emphysema Father    ?     One lung  ? Cystic fibrosis Sister   ? Hyperthyroidism Sister   ? Osteoarthritis Brother   ? Hyperthyroidism Brother   ? Esophageal cancer Neg Hx   ? Cancer Neg Hx   ?     Head or neck  ? Colon cancer Neg Hx   ? Stomach cancer Neg Hx   ? Breast cancer Neg Hx   ? ? ?Social History  ? ?Socioeconomic History  ? Marital status: Widowed  ?  Spouse name: Not on file  ? Number of children: 2  ? Years of education: Not on file  ? Highest education level: Not on file  ?Occupational History  ? Occupation: Retired  ?  Employer: RETIRED  ?Tobacco Use  ? Smoking status: Former  ?  Packs/day: 2.00  ?  Years: 50.00  ?  Pack years: 100.00  ?  Types: Cigarettes  ?  Quit date: 02/05/2005  ?  Years since quitting: 16.3  ? Smokeless tobacco: Never  ?Vaping Use  ? Vaping Use: Never used  ?Substance and Sexual Activity  ? Alcohol use: Yes  ?  Comment: occasionally  ? Drug use: Never  ? Sexual activity: Not Currently  ?Other Topics Concern  ? Not on file  ?Social History Narrative  ? Married with 2 children; Enjoys painting  ?   ? Quit smoking in 2007; no alcohol; lives in [place] with husband; [name]/d lives in [place]. Had own business. Dont drive- sec to neuropathy.   ? ?Social Determinants of Health  ? ?Financial Resource Strain: Not on file  ?Food Insecurity: Not on file  ?Transportation Needs: Not on file  ?Physical Activity: Not on file  ?Stress: Not on file  ?Social Connections: Not on file  ?Intimate Partner Violence: Not on file  ? ? ?Outpatient Medications Prior to Visit  ?Medication Sig Dispense Refill  ? albuterol (PROAIR HFA) 108 (90 Base) MCG/ACT inhaler Inhale 2 puffs into the lungs every 6 (six) hours as needed for wheezing or shortness of breath. INHALE 2 PUFFS INTO THE LUNGS EVERY 6 HOURS AS NEEDED FOR WHEEZING OR SHORTNESS OF BREATH 3 Inhaler 3  ? ALPRAZolam (XANAX) 0.25 MG tablet Take 1 tablet (0.25 mg total) by mouth 2 (two) times daily as needed for anxiety (sedation caution). 10 tablet 0  ?  budesonide-formoterol (SYMBICORT) 80-4.5 MCG/ACT inhaler Inhale 2 puffs into the lungs 2 (two) times daily. 1 each 12  ? busPIRone (BUSPAR) 15 MG tablet  Take 15 mg by mouth 2 (two) times daily.    ? Cholecalciferol (VITAMIN D) 50 MCG (2000 UT) tablet Take 1 tablet (2,000 Units total) by mouth daily.    ? Coenzyme Q-10 200 MG CAPS Take 200 mg by mouth daily.    ? Cyanocobalamin (B-12) 5000 MCG CAPS Take 5,000 mcg by mouth daily.    ? fexofenadine (ALLEGRA) 180 MG tablet Take 180 mg by mouth as needed.     ? FLUoxetine (PROZAC) 20 MG tablet Take 20 mg by mouth 2 (two) times daily.    ? fluticasone (FLONASE) 50 MCG/ACT nasal spray USE 2 SPRAYS INTO THE NOSE EVERY 12 HOURS AS NEEDED FOR STUFFY NOSE 48 mL 2  ? furosemide (LASIX) 20 MG tablet TAKE 1 TABLET (20 MG TOTAL) BY MOUTH DAILY AS NEEDED FOR FLUID. 90 tablet 1  ? glucose blood (ONETOUCH VERIO) test strip Use as instructed to check sugar 2-3x time daily 200 each 5  ? Iron, Ferrous Sulfate, 325 (65 Fe) MG TABS Take 325 mg by mouth daily. 90 tablet 3  ? metFORMIN (GLUCOPHAGE) 500 MG tablet Take 1 tablet (500 mg total) by mouth daily with supper. 90 tablet 3  ? nitroGLYCERIN (NITROSTAT) 0.4 MG SL tablet Place 1 tablet (0.4 mg total) under the tongue every 5 (five) minutes as needed for chest pain. 25 tablet 3  ? ONETOUCH DELICA LANCETS FINE MISC USE TO CHECK SUGAR 1 TIME DAILY 100 each 5  ? ONETOUCH VERIO test strip USE AS INSTRUCTED TO CHECK SUGAR 1 TIME DAILY 100 strip 8  ? rosuvastatin (CRESTOR) 10 MG tablet TAKE 1 TABLET (10 MG TOTAL) BY MOUTH EVERY OTHER DAY. 45 tablet 2  ? Spacer/Aero Chamber [employer] Use with  inhaler as needed.  J44.9 1 each 0  ? SYNTHROID 150 MCG tablet TAKE 1 TABLET BY MOUTH DAILY BEFORE BREAKFAST. 90 tablet 2  ? ezetimibe (ZETIA) 10 MG tablet TAKE 1 TABLET BY MOUTH EVERYDAY AT BEDTIME 90 tablet 1  ? ?No facility-administered medications prior to visit.  ? ? ?Allergies  ?Allergen Reactions  ? Antihistamines, Diphenhydramine-Type Other  (See Comments)  ?  Able to tolerate only allegra.   ? Sulfonamide Derivatives Swelling  ?  REACTION: closed throat  ? Incruse Ellipta [Umeclidinium Bromide] Cough  ?  Caused voice change and severe coughing  ? [place]

## 2021-06-08 NOTE — Assessment & Plan Note (Addendum)
History of the same.  Patient is having some increased shortness of breath along with lower extremity edema.  Will obtain chest x-ray and basic labs in office, pending result.  Follow-up with cardiology as recommended scheduled.  She has had a 7 pound increase since 04/27/2021.  Most recently evaluated on 05/26/2021 for virtual visit with reported return of 215 pounds. ? ?

## 2021-06-08 NOTE — Patient Instructions (Signed)
Nice to see you today ?EKG looks ok ?I will be in touch with the xray and lab results ?Also the [place] results ?Follow up if no improvement ?

## 2021-06-08 NOTE — Assessment & Plan Note (Signed)
Pulmonology and is on chronic oxygen user.  No wheezing on exam today.  EKG within normal limits in office today.  Pending chest x-ray and basic lab work.  Also pending ultrasound of lower extremity ?

## 2021-06-09 LAB — COMPREHENSIVE METABOLIC PANEL
ALT: 7 U/L (ref 0–35)
AST: 13 U/L (ref 0–37)
Albumin: 3.9 g/dL (ref 3.5–5.2)
Alkaline Phosphatase: 67 U/L (ref 39–117)
BUN: 21 mg/dL (ref 6–23)
CO2: 31 mEq/L (ref 19–32)
Calcium: 9.6 mg/dL (ref 8.4–10.5)
Chloride: 103 mEq/L (ref 96–112)
Creatinine, Ser: 0.8 mg/dL (ref 0.40–1.20)
GFR: 67.97 mL/min (ref >60.00)
Glucose, Bld: 91 mg/dL (ref 70–99)
Potassium: 4.7 mEq/L (ref 3.5–5.1)
Sodium: 142 mEq/L (ref 135–145)
Total Bilirubin: 0.4 mg/dL (ref 0.2–1.2)
Total Protein: 6.9 g/dL (ref 6.0–8.3)

## 2021-06-09 LAB — CBC
HCT: 29.5 % — ABNORMAL LOW (ref 36.0–46.0)
Hemoglobin: 9.5 g/dL — ABNORMAL LOW (ref 12.0–15.0)
MCHC: 32.2 g/dL (ref 30.0–36.0)
MCV: 92.1 fl (ref 78.0–100.0)
Platelets: 236 10*3/uL (ref 150.0–400.0)
RBC: 3.2 Mil/uL — ABNORMAL LOW (ref 3.87–5.11)
RDW: 15.3 % (ref 11.5–15.5)
WBC: 6.1 10*3/uL (ref 4.0–10.5)

## 2021-06-09 LAB — TSH: TSH: 0.17 u[IU]/mL — ABNORMAL LOW (ref 0.35–5.50)

## 2021-06-09 LAB — BRAIN NATRIURETIC PEPTIDE: Pro B Natriuretic peptide (BNP): 128 pg/mL — ABNORMAL HIGH (ref 0.0–100.0)

## 2021-06-15 DIAGNOSIS — J441 Chronic obstructive pulmonary disease with (acute) exacerbation: Secondary | ICD-10-CM | POA: Diagnosis not present

## 2021-06-15 DIAGNOSIS — F411 Generalized anxiety disorder: Secondary | ICD-10-CM | POA: Diagnosis not present

## 2021-06-15 DIAGNOSIS — F32A Depression, unspecified: Secondary | ICD-10-CM | POA: Diagnosis not present

## 2021-06-15 DIAGNOSIS — I110 Hypertensive heart disease with heart failure: Secondary | ICD-10-CM | POA: Diagnosis not present

## 2021-06-15 DIAGNOSIS — I5042 Chronic combined systolic (congestive) and diastolic (congestive) heart failure: Secondary | ICD-10-CM | POA: Diagnosis not present

## 2021-06-15 DIAGNOSIS — S81812D Laceration without foreign body, left lower leg, subsequent encounter: Secondary | ICD-10-CM | POA: Diagnosis not present

## 2021-06-15 NOTE — Progress Notes (Signed)
?  ?  ? ? ? ?Date:  06/16/2021  ? ?ID:  Alexandria [name], DOB Aug 02, 1935, MRN [id number] ? ?Patient Location:  ?[place] ?[place] [address]  ? ?Provider location:   ?[place] [employer] office ? ?PCP:  Tonia Ghent, MD  ?Cardiologist:  [facility] ? ?Chief Complaint  ?Patient presents with  ? 3 month follow up   ?  Patient c/o right shoulder pain that radiates across from her right breast through to her left breast, shortness of breath & LE edema. Medications reviewed by the patient verbally.   ? ? ? ?History of Present Illness:   ? ?Alexandria [name] is a 86 y.o. female with past medical history of ?obesity,   ?long history of smoking for 50 years, COPD, quit 15 years ?DM2, dx in 2006, non-insulin-dependent, controlled, with  mild CKD ?hyperlipidemia,  ?hypertension,   ?2012  chest pain, stress test at that time with no ischemia, normal EF  ?July 2016 with COPD exacerbation, sepsis. ?sleep apnea, not on CPAP, seen by Dr. Llana Aliment, mask does not fit ?Chronic SOB, normal echo 09/2014 ?Anxiety ?Diagnosis of lung cancer,  completed radiation , planned chemo ?Normal ejection fraction December 2022 ?She presents for follow-up of her shortness of breath symptoms ? ?Last seen in clinic by myself December 2022 ?Presents today in a wheelchair, ?Reports having recent Fall one month ago, skinned her knees, has bandages in place ? ?Also slept in recliner, but now with arm pain on right, ?Having sharp pain right posterior shoulder radiating underneath her axillary area across the right chest, worse with movement and palpation ? ?Chronic leg swelling worse on the right than the left ?Not wearing comprssion hose, unable to move right arm to put compression hose on, family unavailable to help ? ?Denies any near-syncope concerning for orthostasis ? ?EKG personally reviewed by myself on todays visit ?Nsr rate 85 no ST or wave changes ? ?Echocardiogram February 11, 2021 reviewed ? 1. Left ventricular  ejection fraction, by estimation, is 55 to 60%. The  ?left ventricle has normal function. The left ventricle has no regional  ?wall motion abnormalities. Left ventricular diastolic parameters are  ?consistent with Grade II diastolic  ?dysfunction (pseudonormalization).  ? 2. Right ventricular systolic function is normal. The right ventricular  ?size is normal.  ? 3. Left atrial size was mild to moderately dilated.  ? 4. The mitral valve is degenerative. No evidence of mitral valve  ?regurgitation. Moderate to severe mitral annular calcification.  ? 5. The aortic valve was not well visualized. Aortic valve regurgitation  ?is not visualized.  ? 6. The inferior vena cava is normal in size with <50% respiratory  ?variability, suggesting right atrial pressure of 8 mmHg.  ? ? ?Past Medical History:  ?Diagnosis Date  ? Allergy, unspecified not elsewhere classified   ? Anxiety   ? Cataract   ? Chronic diastolic congestive heart failure ([place])   ? COPD (chronic obstructive pulmonary disease) ([place]) 03/08/1998  ? PFTs 12/12/2002 FEV 1 1.42 (64%) ratio 58 with no better after B2 and DLCO75%; PFTs 11/20/09 FEV1 1.50 (73%) ratio 50 no better after B2 with DLCO 62%; Hfa 50% 11/20/2009 >75%, 01/13/10 p coaching  ? Depression 12/07/1998  ? Diabetes mellitus type II 02/05/2006  ? Dr. Cruzita Lederer with endo  ? Disorders of bursae and tendons in shoulder region, unspecified   ? Rotator cuff syndrome, right  ? E. coli bacteremia   ? Esophagitis   ? GERD (  gastroesophageal reflux disease)   ? History of UTI   ? Hyperlipidemia 10/04/2000  ? Hypertension 03/08/1992  ? Hypothyroidism 03/08/1968  ? Iron deficiency anemia   ? Lung nodule   ? radiation starts 10-08-2019  ? Malignant neoplasm of right upper lobe of lung ([place]) 07/24/2019  ? Obesity   ? NOS  ? OSA (obstructive sleep apnea)   ? PSG 01/27/10 AHI 13, pt does not know CPAP settings  ? Peripheral neuropathy   ? Likely due to DM per Dr. Erling Cruz  ? Ventral hernia   ? ?Past Surgical History:  ?Procedure  Laterality Date  ? ABD U/S  03/19/1999  ? Nml x2 foci in liver  ? ADENOSINE MYOVIEW  06/02/2007  ? Nml  ? CARDIOLITE PERSANTINE  08/24/2000  ? Nml  ? CAROTID U/S  08/24/2000  ? 1-39% ICA stenosis  ? CAROTID U/S  06/02/2007  ? No apprec change   ? CARPAL TUNNEL RELEASE  12/1997  ? Right  ? CESAREAN SECTION    ? x2 Breech/ repeat  ? CHOLECYSTECTOMY  1997  ? COLONOSCOPY WITH PROPOFOL N/A 02/12/2016  ? Procedure: COLONOSCOPY WITH PROPOFOL;  Surgeon: Milus Banister, MD;  [facility]: [facility];  Service: Endoscopy;  Laterality: N/A;  ? CT ABD W & PELVIS WO/W CM    ? Abd hemangiomas of liver, 1 cm R renal cyst  ? DENTAL SURGERY  2016  ? Implants  ? DEXA  07/03/2003  ? Nml  ? ESOPHAGOGASTRODUODENOSCOPY  12/05/1997  ? Nml (due to hoarseness)  ? ESOPHAGOGASTRODUODENOSCOPY (EGD) WITH PROPOFOL N/A 02/12/2016  ? Procedure: ESOPHAGOGASTRODUODENOSCOPY (EGD) WITH PROPOFOL;  Surgeon: Milus Banister, MD;  Location: [facility];  Service: Endoscopy;  Laterality: N/A;  ? GALLBLADDER SURGERY    ? HERNIA REPAIR  01/24/2009  ? Lap Ventr w/ Lysis of adhesions (Dr. Donne Hazel)  ? knee arthroscopic surgery  years ago  ? right  ? [place]  ? Right, [place]  ? SHOULDER OPEN ROTATOR CUFF REPAIR  02/08/2012  ? Procedure: ROTATOR CUFF REPAIR SHOULDER OPEN;  Surgeon: Magnus Sinning, MD;  Location: [facility];  Service: Orthopedics;  Laterality: Left;  Left Shoulder Open Anterior Acrominectomy Rotator Cuff Repair Open Distal Clavicle Resection ,tissue mend graft, and repair of biceps tendon  ? THUMB RELEASE  12/1997  ? Right  ? TONSILLECTOMY    ? TOTAL ABDOMINAL HYSTERECTOMY  1985  ? Due to dysmennorhea  ? US ECHOCARDIOGRAPHY  06/02/2007  ?  ? ? ?Allergies:   Antihistamines, diphenhydramine-type; Sulfonamide derivatives; Incruse ellipta [umeclidinium bromide]; Clarithromycin; Codeine; Lyrica [pregabalin]; Spiriva handihaler [tiotropium bromide monohydrate]; and Vraylar [cariprazine]  ? ?Social History  ? ?Tobacco Use  ? Smoking  status: Former  ?  Packs/day: 2.00  ?  Years: 50.00  ?  Pack years: 100.00  ?  Types: Cigarettes  ?  Quit date: 02/05/2005  ?  Years since quitting: 16.3  ? Smokeless tobacco: Never  ?Vaping Use  ? Vaping Use: Never used  ?Substance Use Topics  ? Alcohol use: Yes  ?  Comment: occasionally  ? Drug use: Never  ?  ? ?Current Outpatient Medications on File Prior to Visit  ?Medication Sig Dispense Refill  ? albuterol (PROAIR HFA) 108 (90 Base) MCG/ACT inhaler Inhale 2 puffs into the lungs every 6 (six) hours as needed for wheezing or shortness of breath. INHALE 2 PUFFS INTO THE LUNGS EVERY 6 HOURS AS NEEDED FOR WHEEZING OR SHORTNESS OF BREATH 3 Inhaler 3  ?  ALPRAZolam (XANAX) 0.25 MG tablet Take 1 tablet (0.25 mg total) by mouth 2 (two) times daily as needed for anxiety (sedation caution). 10 tablet 0  ? budesonide-formoterol (SYMBICORT) 80-4.5 MCG/ACT inhaler Inhale 2 puffs into the lungs 2 (two) times daily. 1 each 12  ? busPIRone (BUSPAR) 15 MG tablet Take 15 mg by mouth 2 (two) times daily.    ? Cholecalciferol (VITAMIN D) 50 MCG (2000 UT) tablet Take 1 tablet (2,000 Units total) by mouth daily.    ? Coenzyme Q-10 200 MG CAPS Take 200 mg by mouth daily.    ? Cyanocobalamin (B-12) 5000 MCG CAPS Take 5,000 mcg by mouth daily.    ? ezetimibe (ZETIA) 10 MG tablet TAKE 1 TABLET BY MOUTH AT BEDTIME 90 tablet 2  ? fexofenadine (ALLEGRA) 180 MG tablet Take 180 mg by mouth as needed.     ? FLUoxetine (PROZAC) 20 MG tablet Take 20 mg by mouth 2 (two) times daily.    ? fluticasone (FLONASE) 50 MCG/ACT nasal spray USE 2 SPRAYS INTO THE NOSE EVERY 12 HOURS AS NEEDED FOR STUFFY NOSE 48 mL 2  ? furosemide (LASIX) 20 MG tablet TAKE 1 TABLET (20 MG TOTAL) BY MOUTH DAILY AS NEEDED FOR FLUID. 90 tablet 1  ? glucose blood (ONETOUCH VERIO) test strip Use as instructed to check sugar 2-3x time daily 200 each 5  ? Iron, Ferrous Sulfate, 325 (65 Fe) MG TABS Take 325 mg by mouth daily. 90 tablet 3  ? metFORMIN (GLUCOPHAGE) 500 MG tablet Take  1 tablet (500 mg total) by mouth daily with supper. 90 tablet 3  ? nitroGLYCERIN (NITROSTAT) 0.4 MG SL tablet Place 1 tablet (0.4 mg total) under the tongue every 5 (five) minutes as needed for chest pa

## 2021-06-16 ENCOUNTER — Encounter: Payer: Self-pay | Admitting: Cardiovascular Disease

## 2021-06-16 ENCOUNTER — Ambulatory Visit (INDEPENDENT_AMBULATORY_CARE_PROVIDER_SITE_OTHER): Payer: Medicare Other

## 2021-06-16 ENCOUNTER — Ambulatory Visit (INDEPENDENT_AMBULATORY_CARE_PROVIDER_SITE_OTHER): Payer: Medicare Other | Admitting: Cardiovascular Disease

## 2021-06-16 ENCOUNTER — Encounter: Payer: Self-pay | Admitting: Emergency Medicine

## 2021-06-16 ENCOUNTER — Ambulatory Visit
Admission: EM | Admit: 2021-06-16 | Discharge: 2021-06-16 | Disposition: A | Discharge status: Home / Self Care | Payer: Medicare Other | Attending: Student | Admitting: Student

## 2021-06-16 VITALS — BP 120/60 | HR 85 | Ht 65.0 in | Wt 217.0 lb

## 2021-06-16 DIAGNOSIS — I25118 Atherosclerotic heart disease of native coronary artery with other forms of angina pectoris: Secondary | ICD-10-CM

## 2021-06-16 DIAGNOSIS — E782 Mixed hyperlipidemia: Secondary | ICD-10-CM

## 2021-06-16 DIAGNOSIS — G903 Multi-system degeneration of the autonomic nervous system: Secondary | ICD-10-CM | POA: Diagnosis not present

## 2021-06-16 DIAGNOSIS — I739 Peripheral vascular disease, unspecified: Secondary | ICD-10-CM

## 2021-06-16 DIAGNOSIS — E6609 Other obesity due to excess calories: Secondary | ICD-10-CM | POA: Diagnosis not present

## 2021-06-16 DIAGNOSIS — R0602 Shortness of breath: Secondary | ICD-10-CM | POA: Diagnosis not present

## 2021-06-16 DIAGNOSIS — R079 Chest pain, unspecified: Secondary | ICD-10-CM | POA: Diagnosis not present

## 2021-06-16 DIAGNOSIS — S2231XA Fracture of one rib, right side, initial encounter for closed fracture: Secondary | ICD-10-CM | POA: Diagnosis not present

## 2021-06-16 DIAGNOSIS — I10 Essential (primary) hypertension: Secondary | ICD-10-CM | POA: Diagnosis not present

## 2021-06-16 DIAGNOSIS — I5032 Chronic diastolic (congestive) heart failure: Secondary | ICD-10-CM

## 2021-06-16 DIAGNOSIS — R0782 Intercostal pain: Secondary | ICD-10-CM

## 2021-06-16 DIAGNOSIS — Z6835 Body mass index (BMI) 35.0-35.9, adult: Secondary | ICD-10-CM | POA: Diagnosis not present

## 2021-06-16 DIAGNOSIS — S2241XA Multiple fractures of ribs, right side, initial encounter for closed fracture: Secondary | ICD-10-CM | POA: Diagnosis not present

## 2021-06-16 MED ORDER — CYCLOBENZAPRINE HCL 10 MG PO TABS: 10.0000 mg | ORAL_TABLET | Freq: Every day | ORAL | 0 refills | Status: DC

## 2021-06-16 NOTE — Discharge Instructions (Addendum)
-  Tylenol, rest, ice ?-Flexeril at bedtime. This will make you drowsy, so be careful to not stand suddenly following sleep. ?-Follow-up with PCP in 5-7 days for recheck ?-Head to the ER if new symptoms like shortness of breath, chest pain ? ?

## 2021-06-16 NOTE — ED Provider Notes (Signed)
?[facility] ? ? ? ?CSN: [id number] ?Arrival date & time: 06/16/21  1454 ? ? ?  ? ?History   ?Chief Complaint ?Chief Complaint  ?Patient presents with  ? Shoulder Pain  ? ? ?HPI ?Alexandria [name] is a 86 y.o. female presenting with right-sided rib pain for 6 days following sleeping in a recliner.  History extensive as below, including CHF, COPD on oxygen at baseline, lung cancer, diabetes.  States that she fell asleep in a recliner 6 days ago, her right arm was wedged in the gap between the seat and the armrest.  States when she awoke, the arm was very stiff, and she felt pain under the arm and by the shoulder blade, worse with movement.  There is no associated shortness of breath, chest pain, dizziness, weakness.  States she told her cardiologist about this earlier today, and he recommended she get an x-ray of the shoulder, scapula, ribs.  Denies recent falls. ? ?HPI ? ?Past Medical History:  ?Diagnosis Date  ? Allergy, unspecified not elsewhere classified   ? Anxiety   ? Cataract   ? Chronic diastolic congestive heart failure ([place])   ? COPD (chronic obstructive pulmonary disease) ([place]) 03/08/1998  ? PFTs 12/12/2002 FEV 1 1.42 (64%) ratio 58 with no better after B2 and DLCO75%; PFTs 11/20/09 FEV1 1.50 (73%) ratio 50 no better after B2 with DLCO 62%; Hfa 50% 11/20/2009 >75%, 01/13/10 p coaching  ? Depression 12/07/1998  ? Diabetes mellitus type II 02/05/2006  ? Dr. Cruzita Lederer with endo  ? Disorders of bursae and tendons in shoulder region, unspecified   ? Rotator cuff syndrome, right  ? E. coli bacteremia   ? Esophagitis   ? GERD (gastroesophageal reflux disease)   ? History of UTI   ? Hyperlipidemia 10/04/2000  ? Hypertension 03/08/1992  ? Hypothyroidism 03/08/1968  ? Iron deficiency anemia   ? Lung nodule   ? radiation starts 10-08-2019  ? Malignant neoplasm of right upper lobe of lung ([place]) 07/24/2019  ? Obesity   ? NOS  ? OSA (obstructive sleep apnea)   ? PSG 01/27/10 AHI 13, pt does not know CPAP settings  ?  Peripheral neuropathy   ? Likely due to DM per Dr. Erling Cruz  ? Ventral hernia   ? ? ?Patient Active Problem List  ? Diagnosis Date Noted  ? Leg swelling 06/08/2021  ? Grief 05/27/2021  ? H/O laceration of skin 05/27/2021  ? PND (post-nasal drip) 04/27/2021  ? Wheezing 04/27/2021  ? Acute cough 04/27/2021  ? Physical deconditioning 01/30/2020  ? Healthcare maintenance 01/30/2020  ? PAD (peripheral artery disease) ([place]) 01/27/2020  ? Sore throat 11/21/2019  ? GERD (gastroesophageal reflux disease) 11/21/2019  ? CAD (coronary artery disease) 07/24/2019  ? Malignant neoplasm of right upper lobe of lung ([place]) 07/24/2019  ? Vitamin D deficiency 06/29/2018  ? Hyperkalemia 06/28/2018  ? Dysuria 01/01/2018  ? Abnormal findings on diagnostic imaging of lung 12/18/2017  ? Rhinitis 12/18/2017  ? Knee laceration, right, subsequent encounter 10/06/2016  ? Knee pain, acute 10/06/2016  ? Chronic pain of left knee 09/15/2016  ? Right foot pain 09/15/2016  ? Chronic respiratory failure with hypoxia ([place]) 07/14/2016  ? Diverticulosis of colon without hemorrhage   ? Hemorrhoids   ? Hiatal hernia   ? Type 2 diabetes mellitus with diabetic neuropathy, without long-term current use of insulin ([place]) 04/04/2015  ? Left temporal headache 03/14/2015  ? Vertigo 03/14/2015  ? Chest tightness 12/04/2014  ? Chronic diastolic CHF (  congestive heart failure) ([place])   ? Iron deficiency anemia   ? Obesity hypoventilation syndrome ([place])   ? Acute cystitis   ? Symptomatic anemia   ? Medicare annual wellness visit, subsequent 06/14/2014  ? Advance care planning 06/14/2014  ? RLQ abdominal pain 05/10/2014  ? COPD exacerbation ([place]) 04/28/2014  ? Lower extremity edema 09/15/2012  ? Shortness of breath 08/24/2012  ? Shoulder pain 12/17/2011  ? Abdominal hernia 12/17/2011  ? Anxiety 09/23/2011  ? Osteopenia 07/25/2011  ? Obstructive sleep apnea 02/09/2010  ? MURMUR 02/04/2010  ? CHEST PAIN 01/13/2010  ? ONYCHOMYCOSIS, TOENAILS 06/30/2009  ? PALPITATIONS  08/29/2008  ? ESOPHAGEAL STENOSIS 11/22/2007  ? DYSPHAGIA 11/08/2007  ? ESOPHAGITIS 10/11/2007  ? COPD GOLD II 05/23/2007  ? ALLERGY 07/25/2006  ? NUMBNESS 10/06/2001  ? Hyperlipemia 10/04/2000  ? Depression, recurrent ([place]) 03/08/1997  ? Essential hypertension 03/08/1992  ? Obesity, Class III, BMI 40-49.9 (morbid obesity) ([place]) 03/08/1989  ? ROTATOR CUFF SYNDROME, RIGHT 03/08/1982  ? Hypothyroidism 03/08/1968  ? ? ?Past Surgical History:  ?Procedure Laterality Date  ? ABD U/S  03/19/1999  ? Nml x2 foci in liver  ? ADENOSINE MYOVIEW  06/02/2007  ? Nml  ? CARDIOLITE PERSANTINE  08/24/2000  ? Nml  ? CAROTID U/S  08/24/2000  ? 1-39% ICA stenosis  ? CAROTID U/S  06/02/2007  ? No apprec change   ? CARPAL TUNNEL RELEASE  12/1997  ? Right  ? CESAREAN SECTION    ? x2 Breech/ repeat  ? CHOLECYSTECTOMY  1997  ? COLONOSCOPY WITH PROPOFOL N/A 02/12/2016  ? Procedure: COLONOSCOPY WITH PROPOFOL;  Surgeon: Milus Banister, MD;  Location: [facility];  Service: Endoscopy;  Laterality: N/A;  ? CT ABD W & PELVIS WO/W CM    ? Abd hemangiomas of liver, 1 cm R renal cyst  ? DENTAL SURGERY  2016  ? Implants  ? DEXA  07/03/2003  ? Nml  ? ESOPHAGOGASTRODUODENOSCOPY  12/05/1997  ? Nml (due to hoarseness)  ? ESOPHAGOGASTRODUODENOSCOPY (EGD) WITH PROPOFOL N/A 02/12/2016  ? Procedure: ESOPHAGOGASTRODUODENOSCOPY (EGD) WITH PROPOFOL;  Surgeon: Milus Banister, MD;  Location: [facility];  Service: Endoscopy;  Laterality: N/A;  ? GALLBLADDER SURGERY    ? HERNIA REPAIR  01/24/2009  ? Lap Ventr w/ Lysis of adhesions (Dr. Donne Hazel)  ? knee arthroscopic surgery  years ago  ? right  ? [place]  ? Right, [place]  ? SHOULDER OPEN ROTATOR CUFF REPAIR  02/08/2012  ? Procedure: ROTATOR CUFF REPAIR SHOULDER OPEN;  Surgeon: Magnus Sinning, MD;  Location: [facility];  Service: Orthopedics;  Laterality: Left;  Left Shoulder Open Anterior Acrominectomy Rotator Cuff Repair Open Distal Clavicle Resection ,tissue mend graft, and repair of biceps  tendon  ? THUMB RELEASE  12/1997  ? Right  ? TONSILLECTOMY    ? TOTAL ABDOMINAL HYSTERECTOMY  1985  ? Due to dysmennorhea  ? US ECHOCARDIOGRAPHY  06/02/2007  ? ? ?OB History   ?No obstetric history on file. ?  ? ? ? ?Home Medications   ? ?Prior to Admission medications   ?Medication Sig Start Date End Date Taking? Authorizing Provider  ?cyclobenzaprine (FLEXERIL) 10 MG tablet Take 1 tablet (10 mg total) by mouth at bedtime. 06/16/21  Yes Hazel Sams, PA-C  ?albuterol (PROAIR HFA) 108 (90 Base) MCG/ACT inhaler Inhale 2 puffs into the lungs every 6 (six) hours as needed for wheezing or shortness of breath. INHALE 2 PUFFS INTO THE LUNGS EVERY 6 HOURS  AS NEEDED FOR WHEEZING OR SHORTNESS OF BREATH 04/19/18   Chesley Mires, MD  ?ALPRAZolam Duanne Moron) 0.25 MG tablet Take 1 tablet (0.25 mg total) by mouth 2 (two) times daily as needed for anxiety (sedation caution). 05/26/21   Tonia Ghent, MD  ?budesonide-formoterol [facility]) 80-4.5 MCG/ACT inhaler Inhale 2 puffs into the lungs 2 (two) times daily. 02/03/21   Chesley Mires, MD  ?busPIRone (BUSPAR) 15 MG tablet Take 15 mg by mouth 2 (two) times daily. 05/21/20   [provider]  ?Cholecalciferol (VITAMIN D) 50 MCG (2000 UT) tablet Take 1 tablet (2,000 Units total) by mouth daily. 06/27/18   Tonia Ghent, MD  ?Coenzyme Q-10 200 MG CAPS Take 200 mg by mouth daily.    [provider]  ?Cyanocobalamin (B-12) 5000 MCG CAPS Take 5,000 mcg by mouth daily.    [provider]  ?ezetimibe (ZETIA) 10 MG tablet TAKE 1 TABLET BY MOUTH AT BEDTIME 06/08/21   Tonia Ghent, MD  ?fexofenadine (ALLEGRA) 180 MG tablet Take 180 mg by mouth as needed.     [provider]  ?FLUoxetine (PROZAC) 20 MG tablet Take 20 mg by mouth 2 (two) times daily.    [provider]  ?fluticasone (FLONASE) 50 MCG/ACT nasal spray USE 2 SPRAYS INTO THE NOSE EVERY 12 HOURS AS NEEDED FOR STUFFY NOSE 03/10/20   Tonia Ghent, MD  ?furosemide (LASIX) 20 MG tablet TAKE  1 TABLET (20 MG TOTAL) BY MOUTH DAILY AS NEEDED FOR FLUID. 02/18/21   Minna Merritts, MD  ?glucose blood (ONETOUCH VERIO) test strip Use as instructed to check sugar 2-3x time daily 02/21/17   Philemon Kingdom

## 2021-06-16 NOTE — Patient Instructions (Signed)
Medication Instructions:  ?No changes ? ?If you need a refill on your cardiac medications before your next appointment, please call your pharmacy.  ? ?Lab work: ?No new labs needed ? ?Testing/Procedures: ?No new testing needed ? ?Follow-Up: ?At [facility], you and your health needs are our priority.  As part of our continuing mission to provide you with exceptional heart care, we have created designated Provider Care Teams.  These Care Teams include your primary Cardiologist (physician) and Advanced Practice Providers (APPs -  Physician Assistants and Nurse Practitioners) who all work together to provide you with the care you need, when you need it. ? ?You will need a follow up appointment in 6 months, APP ok ? ?Providers on your designated Care Team:   ?Murray Hodgkins, NP ?Christell Faith, PA-C ?Cadence Kathlen Mody, PA-C ? ?COVID-19 Vaccine Information can be found at: ShippingScam.co.uk For questions related to vaccine distribution or appointments, please email vaccine@[email or web address] .[email or web address] or call [phone].  ? ?

## 2021-06-16 NOTE — ED Triage Notes (Signed)
Pt c/o pain at her right shoulder blade that radiates down to her right rib cage area x 6 days. Pt states she had her right arm wedged into the recliner and she fell asleep.  ?

## 2021-06-17 DIAGNOSIS — F411 Generalized anxiety disorder: Secondary | ICD-10-CM | POA: Diagnosis not present

## 2021-06-17 DIAGNOSIS — I110 Hypertensive heart disease with heart failure: Secondary | ICD-10-CM | POA: Diagnosis not present

## 2021-06-17 DIAGNOSIS — F32A Depression, unspecified: Secondary | ICD-10-CM | POA: Diagnosis not present

## 2021-06-17 DIAGNOSIS — I5042 Chronic combined systolic (congestive) and diastolic (congestive) heart failure: Secondary | ICD-10-CM | POA: Diagnosis not present

## 2021-06-17 DIAGNOSIS — J441 Chronic obstructive pulmonary disease with (acute) exacerbation: Secondary | ICD-10-CM | POA: Diagnosis not present

## 2021-06-17 DIAGNOSIS — S81812D Laceration without foreign body, left lower leg, subsequent encounter: Secondary | ICD-10-CM | POA: Diagnosis not present

## 2021-06-18 DIAGNOSIS — I110 Hypertensive heart disease with heart failure: Secondary | ICD-10-CM | POA: Diagnosis not present

## 2021-06-18 DIAGNOSIS — J441 Chronic obstructive pulmonary disease with (acute) exacerbation: Secondary | ICD-10-CM | POA: Diagnosis not present

## 2021-06-18 DIAGNOSIS — I5042 Chronic combined systolic (congestive) and diastolic (congestive) heart failure: Secondary | ICD-10-CM | POA: Diagnosis not present

## 2021-06-18 DIAGNOSIS — F32A Depression, unspecified: Secondary | ICD-10-CM | POA: Diagnosis not present

## 2021-06-18 DIAGNOSIS — S81812D Laceration without foreign body, left lower leg, subsequent encounter: Secondary | ICD-10-CM | POA: Diagnosis not present

## 2021-06-18 DIAGNOSIS — F411 Generalized anxiety disorder: Secondary | ICD-10-CM | POA: Diagnosis not present

## 2021-06-19 ENCOUNTER — Telehealth: Payer: Self-pay

## 2021-06-19 ENCOUNTER — Ambulatory Visit: Payer: Medicare Other | Admitting: Family

## 2021-06-19 ENCOUNTER — Ambulatory Visit: Payer: Self-pay

## 2021-06-19 ENCOUNTER — Ambulatory Visit: Payer: Medicare Other

## 2021-06-19 NOTE — Telephone Encounter (Signed)
I spoke with Lavella Lemons (DPR signed)for couple of days pt has prod cough; [name] not sure color of phlegm and not sure if fever;not sure if pt has wheezing or CP. Lavella Lemons said the only symptoms pt has mentioned is prod wet cough; pt usually has SOB due to COPD and [name] does not know if pt is worse than usual with SOB. Advised if pt is having difficulty breathing should go to ED. Lavella Lemons said trying to keep pt out of UC or ED and wants appt at [facility]. [name] scheduled appt with Red Christians FNP today at 12:20. UC & ED precautions given and [name] voiced understanding. Lavella Lemons said pt is not in distress at this time and can wait for 12:20 appt. Sending note to T Dugal FNP and Claiborne Billings CMA. Will also teams [employer] ?

## 2021-06-19 NOTE — Telephone Encounter (Signed)
[place] Night - Client ?Nonclinical Telephone Record  ?[facility]? ?Client [place] Night - Client ?Client Site [place] ?Provider Renford Dills - MD ?Contact Type Call ?Who Is Calling Patient / Member / Family / Caregiver ?Caller Name Adah Salvage ?Caller Phone Number [phone] ?Patient Name Alexandria [name] ?Patient DOB 11/13/1935 ?Call Type Message Only Information Provided ?Reason for Call Request to Schedule Office Appointment ?Initial Comment Requesting to leave message, requesting appt for mother. Please call tomorrow. ?Patient request to speak to RN No ?Additional Comment Provided office hours, declined message. ?Disp. Time Disposition Final User ?06/18/2021 9:12:01 PM General Information Provided Yes Allena Napoleon ?Call Closed By: Allena Napoleon ?Transaction Date/Time: 06/18/2021 9:09:13 PM (ET ?

## 2021-06-19 NOTE — Telephone Encounter (Signed)
Unable to reach Alexandria [name] about FNP session limits were full so cannot keep 06/19/21 at 12:20 appt with T Dugal FNP. I left v/m requesting [name] to call [facility] about appt. I also spoke with pt and advised was computer issue that FNP had met session limits for appts today and I cancelled the 06/19/21 appt and no other appts available at [facility]. Pt said she could not have a morning appt because she was still in bed. Pt said she thinks she is having allergies; prod cough has clear phlegm, does not think fever and no other symptoms. SOB no worse than usual. Pt said she does not need to go to ED and I scheduled pt an appt at [place] 06/19/21 at 1:15 PM. UC & ED precautions given and pt voiced understanding. Pt said she has not spoken with daughter this morning because her daughter knows that she would still be asleep. I left another v/m requesting [name] to call office so I can let her know about change in appt place and time. Sending note to [place] triage. While I was finishing this note Alexandria [name] called back and info above was given and Alexandria [name] said she appreciated getting her mom the appt at [facility] and she will contact the person taking pt to the appt with new appt info. Sending note to Eugenia Pancoast FNP with apology and again thanks. ?

## 2021-06-21 DIAGNOSIS — M7550 Bursitis of unspecified shoulder: Secondary | ICD-10-CM | POA: Diagnosis not present

## 2021-06-21 DIAGNOSIS — Z79899 Other long term (current) drug therapy: Secondary | ICD-10-CM | POA: Diagnosis not present

## 2021-06-21 DIAGNOSIS — D509 Iron deficiency anemia, unspecified: Secondary | ICD-10-CM | POA: Diagnosis not present

## 2021-06-21 DIAGNOSIS — Z9981 Dependence on supplemental oxygen: Secondary | ICD-10-CM | POA: Diagnosis not present

## 2021-06-21 DIAGNOSIS — K219 Gastro-esophageal reflux disease without esophagitis: Secondary | ICD-10-CM | POA: Diagnosis not present

## 2021-06-21 DIAGNOSIS — E669 Obesity, unspecified: Secondary | ICD-10-CM | POA: Diagnosis not present

## 2021-06-21 DIAGNOSIS — Z90710 Acquired absence of both cervix and uterus: Secondary | ICD-10-CM | POA: Diagnosis not present

## 2021-06-21 DIAGNOSIS — E1136 Type 2 diabetes mellitus with diabetic cataract: Secondary | ICD-10-CM | POA: Diagnosis not present

## 2021-06-21 DIAGNOSIS — Z6835 Body mass index (BMI) 35.0-35.9, adult: Secondary | ICD-10-CM | POA: Diagnosis not present

## 2021-06-21 DIAGNOSIS — E038 Other specified hypothyroidism: Secondary | ICD-10-CM | POA: Diagnosis not present

## 2021-06-21 DIAGNOSIS — J441 Chronic obstructive pulmonary disease with (acute) exacerbation: Secondary | ICD-10-CM | POA: Diagnosis not present

## 2021-06-21 DIAGNOSIS — I110 Hypertensive heart disease with heart failure: Secondary | ICD-10-CM | POA: Diagnosis not present

## 2021-06-21 DIAGNOSIS — I5042 Chronic combined systolic (congestive) and diastolic (congestive) heart failure: Secondary | ICD-10-CM | POA: Diagnosis not present

## 2021-06-21 DIAGNOSIS — Z9049 Acquired absence of other specified parts of digestive tract: Secondary | ICD-10-CM | POA: Diagnosis not present

## 2021-06-21 DIAGNOSIS — Z87891 Personal history of nicotine dependence: Secondary | ICD-10-CM | POA: Diagnosis not present

## 2021-06-21 DIAGNOSIS — R32 Unspecified urinary incontinence: Secondary | ICD-10-CM | POA: Diagnosis not present

## 2021-06-21 DIAGNOSIS — F411 Generalized anxiety disorder: Secondary | ICD-10-CM | POA: Diagnosis not present

## 2021-06-21 DIAGNOSIS — E782 Mixed hyperlipidemia: Secondary | ICD-10-CM | POA: Diagnosis not present

## 2021-06-21 DIAGNOSIS — S81812D Laceration without foreign body, left lower leg, subsequent encounter: Secondary | ICD-10-CM | POA: Diagnosis not present

## 2021-06-21 DIAGNOSIS — Z7951 Long term (current) use of inhaled steroids: Secondary | ICD-10-CM | POA: Diagnosis not present

## 2021-06-21 DIAGNOSIS — G4733 Obstructive sleep apnea (adult) (pediatric): Secondary | ICD-10-CM | POA: Diagnosis not present

## 2021-06-21 DIAGNOSIS — Z9181 History of falling: Secondary | ICD-10-CM | POA: Diagnosis not present

## 2021-06-21 DIAGNOSIS — E1142 Type 2 diabetes mellitus with diabetic polyneuropathy: Secondary | ICD-10-CM | POA: Diagnosis not present

## 2021-06-21 DIAGNOSIS — F32A Depression, unspecified: Secondary | ICD-10-CM | POA: Diagnosis not present

## 2021-06-21 DIAGNOSIS — Z85118 Personal history of other malignant neoplasm of bronchus and lung: Secondary | ICD-10-CM | POA: Diagnosis not present

## 2021-06-22 ENCOUNTER — Telehealth: Payer: Self-pay | Admitting: Pulmonary Disease

## 2021-06-22 DIAGNOSIS — J441 Chronic obstructive pulmonary disease with (acute) exacerbation: Secondary | ICD-10-CM | POA: Diagnosis not present

## 2021-06-22 DIAGNOSIS — I5042 Chronic combined systolic (congestive) and diastolic (congestive) heart failure: Secondary | ICD-10-CM | POA: Diagnosis not present

## 2021-06-22 DIAGNOSIS — I110 Hypertensive heart disease with heart failure: Secondary | ICD-10-CM | POA: Diagnosis not present

## 2021-06-22 DIAGNOSIS — F411 Generalized anxiety disorder: Secondary | ICD-10-CM | POA: Diagnosis not present

## 2021-06-22 DIAGNOSIS — S81812D Laceration without foreign body, left lower leg, subsequent encounter: Secondary | ICD-10-CM | POA: Diagnosis not present

## 2021-06-22 DIAGNOSIS — F32A Depression, unspecified: Secondary | ICD-10-CM | POA: Diagnosis not present

## 2021-06-22 DIAGNOSIS — J449 Chronic obstructive pulmonary disease, unspecified: Secondary | ICD-10-CM

## 2021-06-22 MED ORDER — ALBUTEROL SULFATE (2.5 MG/3ML) 0.083% IN NEBU: 2.5000 mg | INHALATION_SOLUTION | Freq: Four times a day (QID) | RESPIRATORY_TRACT | 1 refills | Status: DC | PRN

## 2021-06-22 NOTE — Telephone Encounter (Signed)
Called and spoke with patient. She verbalized understanding. She confirmed that she is using [employer] as her DME. She is aware that I will place the order. She will let us know if she hasn't heard anything from [employer] in regards to the machine and albuterol.  ? ?Nothing further needed at time of call.  ?

## 2021-06-22 NOTE — Telephone Encounter (Signed)
Okay to send order for home nebulizer and for albuterol q6h prn for nebulizer. ?

## 2021-06-22 NOTE — Telephone Encounter (Signed)
Called and spoke with Louie Casa from [facility] who saw pt today, pt was wheezing in all lobes and this was going on last week as well.Productive cough with sputum in thick and clear.  randy wanting to know if [facility] wants to rx a nebulizer and nebulizer treatments for pt. Patient has her rescue inhaler and Symbicort.  ? ?Dr. Halford Chessman please advise ? ?

## 2021-06-23 DIAGNOSIS — H35363 Drusen (degenerative) of macula, bilateral: Secondary | ICD-10-CM | POA: Diagnosis not present

## 2021-06-23 DIAGNOSIS — J42 Unspecified chronic bronchitis: Secondary | ICD-10-CM | POA: Diagnosis not present

## 2021-06-23 DIAGNOSIS — H2513 Age-related nuclear cataract, bilateral: Secondary | ICD-10-CM | POA: Diagnosis not present

## 2021-06-23 DIAGNOSIS — J449 Chronic obstructive pulmonary disease, unspecified: Secondary | ICD-10-CM | POA: Diagnosis not present

## 2021-06-23 DIAGNOSIS — G4733 Obstructive sleep apnea (adult) (pediatric): Secondary | ICD-10-CM | POA: Diagnosis not present

## 2021-06-23 DIAGNOSIS — J9611 Chronic respiratory failure with hypoxia: Secondary | ICD-10-CM | POA: Diagnosis not present

## 2021-06-23 DIAGNOSIS — H5203 Hypermetropia, bilateral: Secondary | ICD-10-CM | POA: Diagnosis not present

## 2021-06-23 DIAGNOSIS — R0902 Hypoxemia: Secondary | ICD-10-CM | POA: Diagnosis not present

## 2021-06-23 DIAGNOSIS — E119 Type 2 diabetes mellitus without complications: Secondary | ICD-10-CM | POA: Diagnosis not present

## 2021-06-23 LAB — HM DIABETES EYE EXAM

## 2021-06-24 ENCOUNTER — Inpatient Hospital Stay: Payer: Medicare Other | Admitting: Internal Medicine

## 2021-06-24 ENCOUNTER — Encounter: Payer: Self-pay | Admitting: Internal Medicine

## 2021-06-24 ENCOUNTER — Inpatient Hospital Stay: Payer: Medicare Other

## 2021-06-24 ENCOUNTER — Inpatient Hospital Stay: Payer: Medicare Other | Attending: Internal Medicine

## 2021-06-30 DIAGNOSIS — I110 Hypertensive heart disease with heart failure: Secondary | ICD-10-CM | POA: Diagnosis not present

## 2021-06-30 DIAGNOSIS — F32A Depression, unspecified: Secondary | ICD-10-CM | POA: Diagnosis not present

## 2021-06-30 DIAGNOSIS — S81812D Laceration without foreign body, left lower leg, subsequent encounter: Secondary | ICD-10-CM | POA: Diagnosis not present

## 2021-06-30 DIAGNOSIS — F411 Generalized anxiety disorder: Secondary | ICD-10-CM | POA: Diagnosis not present

## 2021-06-30 DIAGNOSIS — I5042 Chronic combined systolic (congestive) and diastolic (congestive) heart failure: Secondary | ICD-10-CM | POA: Diagnosis not present

## 2021-06-30 DIAGNOSIS — J441 Chronic obstructive pulmonary disease with (acute) exacerbation: Secondary | ICD-10-CM | POA: Diagnosis not present

## 2021-07-01 DIAGNOSIS — J441 Chronic obstructive pulmonary disease with (acute) exacerbation: Secondary | ICD-10-CM | POA: Diagnosis not present

## 2021-07-01 DIAGNOSIS — I110 Hypertensive heart disease with heart failure: Secondary | ICD-10-CM | POA: Diagnosis not present

## 2021-07-01 DIAGNOSIS — F411 Generalized anxiety disorder: Secondary | ICD-10-CM | POA: Diagnosis not present

## 2021-07-01 DIAGNOSIS — I5042 Chronic combined systolic (congestive) and diastolic (congestive) heart failure: Secondary | ICD-10-CM | POA: Diagnosis not present

## 2021-07-01 DIAGNOSIS — S81812D Laceration without foreign body, left lower leg, subsequent encounter: Secondary | ICD-10-CM | POA: Diagnosis not present

## 2021-07-01 DIAGNOSIS — F32A Depression, unspecified: Secondary | ICD-10-CM | POA: Diagnosis not present

## 2021-07-08 DIAGNOSIS — J441 Chronic obstructive pulmonary disease with (acute) exacerbation: Secondary | ICD-10-CM | POA: Diagnosis not present

## 2021-07-08 DIAGNOSIS — F32A Depression, unspecified: Secondary | ICD-10-CM | POA: Diagnosis not present

## 2021-07-08 DIAGNOSIS — I110 Hypertensive heart disease with heart failure: Secondary | ICD-10-CM | POA: Diagnosis not present

## 2021-07-08 DIAGNOSIS — I5042 Chronic combined systolic (congestive) and diastolic (congestive) heart failure: Secondary | ICD-10-CM | POA: Diagnosis not present

## 2021-07-08 DIAGNOSIS — S81812D Laceration without foreign body, left lower leg, subsequent encounter: Secondary | ICD-10-CM | POA: Diagnosis not present

## 2021-07-08 DIAGNOSIS — F411 Generalized anxiety disorder: Secondary | ICD-10-CM | POA: Diagnosis not present

## 2021-07-15 ENCOUNTER — Encounter (HOSPITAL_COMMUNITY): Payer: Self-pay

## 2021-07-15 ENCOUNTER — Inpatient Hospital Stay: Payer: Medicare Other | Attending: Internal Medicine

## 2021-07-15 ENCOUNTER — Ambulatory Visit (HOSPITAL_COMMUNITY)
Admission: RE | Admit: 2021-07-15 | Discharge: 2021-07-15 | Disposition: A | Discharge status: Home / Self Care | Payer: Medicare Other | Source: Ambulatory Visit | Attending: Radiation Oncology | Admitting: Radiation Oncology

## 2021-07-15 ENCOUNTER — Other Ambulatory Visit: Payer: Self-pay

## 2021-07-15 ENCOUNTER — Other Ambulatory Visit: Payer: Self-pay | Admitting: Family Medicine

## 2021-07-15 DIAGNOSIS — J90 Pleural effusion, not elsewhere classified: Secondary | ICD-10-CM | POA: Diagnosis not present

## 2021-07-15 DIAGNOSIS — E782 Mixed hyperlipidemia: Secondary | ICD-10-CM

## 2021-07-15 DIAGNOSIS — C3411 Malignant neoplasm of upper lobe, right bronchus or lung: Secondary | ICD-10-CM | POA: Diagnosis not present

## 2021-07-15 DIAGNOSIS — D649 Anemia, unspecified: Secondary | ICD-10-CM

## 2021-07-15 DIAGNOSIS — C3490 Malignant neoplasm of unspecified part of unspecified bronchus or lung: Secondary | ICD-10-CM | POA: Diagnosis not present

## 2021-07-15 DIAGNOSIS — R918 Other nonspecific abnormal finding of lung field: Secondary | ICD-10-CM | POA: Diagnosis not present

## 2021-07-15 DIAGNOSIS — E039 Hypothyroidism, unspecified: Secondary | ICD-10-CM

## 2021-07-15 MED ORDER — SODIUM CHLORIDE (PF) 0.9 % IJ SOLN
INTRAMUSCULAR | Status: AC
  Filled 2021-07-15: qty 50

## 2021-07-15 MED ORDER — IOHEXOL 300 MG/ML  SOLN
75.0000 mL | Freq: Once | INTRAMUSCULAR | Status: AC | PRN
  Administered 2021-07-15: 75 mL via INTRAVENOUS

## 2021-07-16 ENCOUNTER — Telehealth: Payer: Self-pay | Admitting: Family Medicine

## 2021-07-16 DIAGNOSIS — I110 Hypertensive heart disease with heart failure: Secondary | ICD-10-CM | POA: Diagnosis not present

## 2021-07-16 DIAGNOSIS — I5042 Chronic combined systolic (congestive) and diastolic (congestive) heart failure: Secondary | ICD-10-CM | POA: Diagnosis not present

## 2021-07-16 DIAGNOSIS — F411 Generalized anxiety disorder: Secondary | ICD-10-CM | POA: Diagnosis not present

## 2021-07-16 DIAGNOSIS — J441 Chronic obstructive pulmonary disease with (acute) exacerbation: Secondary | ICD-10-CM | POA: Diagnosis not present

## 2021-07-16 DIAGNOSIS — S81812D Laceration without foreign body, left lower leg, subsequent encounter: Secondary | ICD-10-CM | POA: Diagnosis not present

## 2021-07-16 DIAGNOSIS — F32A Depression, unspecified: Secondary | ICD-10-CM | POA: Diagnosis not present

## 2021-07-16 MED ORDER — NITROGLYCERIN 0.4 MG SL SUBL: 0.4000 mg | SUBLINGUAL_TABLET | SUBLINGUAL | 3 refills | Status: DC | PRN

## 2021-07-16 NOTE — Telephone Encounter (Signed)
Left verbal orders on secure VM for [facility]. Called patient and advised her to try taking the nitroglycerin if she has some or the albuterol to see if that helps with chest sx. Patient needs new rx for the nitroglycerin sent in. ?

## 2021-07-16 NOTE — Addendum Note (Signed)
Addended by: Sherrilee Gilles B on: 07/16/2021 05:11 PM ? ? Modules accepted: Orders ? ?

## 2021-07-16 NOTE — Telephone Encounter (Addendum)
Please give the order.  Thanks.  ? ?Please have her see if albuterol or if nitroglycerin helps with chest sx and update [place].   ?If escalating chest discomfort/pain, then seek eval.   ? ?I routed a copy of this note to Dr. Rockey Situ about the chest tightness as FYI, with appreciation.   ?

## 2021-07-16 NOTE — Telephone Encounter (Signed)
HH ORDERS  ? ?Caller Name: Colletta [place] ?Home Health Agency Name: [facility] ?Callback Phone #: [phone] ?Service Requested: Nursing ?(examples: OT/PT/Skilled Nursing/Social Work/Speech Therapy/Wound Care) ?Frequency of Visits: Every other week for 9 weeks ? ?Pt reports chest tightness for 2 weeks, worsens with exertion and eating, describes it as fullnesss. Rest helps. Pts vitals are stable ? ? ?  ?

## 2021-07-18 ENCOUNTER — Encounter: Payer: Self-pay | Admitting: Radiation Oncology

## 2021-07-18 ENCOUNTER — Encounter: Payer: Self-pay | Admitting: Internal Medicine

## 2021-07-20 DIAGNOSIS — J449 Chronic obstructive pulmonary disease, unspecified: Secondary | ICD-10-CM | POA: Diagnosis not present

## 2021-07-21 ENCOUNTER — Other Ambulatory Visit: Payer: Medicare Other

## 2021-07-21 DIAGNOSIS — E1136 Type 2 diabetes mellitus with diabetic cataract: Secondary | ICD-10-CM | POA: Diagnosis not present

## 2021-07-21 DIAGNOSIS — Z79899 Other long term (current) drug therapy: Secondary | ICD-10-CM | POA: Diagnosis not present

## 2021-07-21 DIAGNOSIS — F32A Depression, unspecified: Secondary | ICD-10-CM | POA: Diagnosis not present

## 2021-07-21 DIAGNOSIS — G4733 Obstructive sleep apnea (adult) (pediatric): Secondary | ICD-10-CM | POA: Diagnosis not present

## 2021-07-21 DIAGNOSIS — J449 Chronic obstructive pulmonary disease, unspecified: Secondary | ICD-10-CM | POA: Diagnosis not present

## 2021-07-21 DIAGNOSIS — K219 Gastro-esophageal reflux disease without esophagitis: Secondary | ICD-10-CM | POA: Diagnosis not present

## 2021-07-21 DIAGNOSIS — E038 Other specified hypothyroidism: Secondary | ICD-10-CM | POA: Diagnosis not present

## 2021-07-21 DIAGNOSIS — E669 Obesity, unspecified: Secondary | ICD-10-CM | POA: Diagnosis not present

## 2021-07-21 DIAGNOSIS — Z87891 Personal history of nicotine dependence: Secondary | ICD-10-CM | POA: Diagnosis not present

## 2021-07-21 DIAGNOSIS — I110 Hypertensive heart disease with heart failure: Secondary | ICD-10-CM | POA: Diagnosis not present

## 2021-07-21 DIAGNOSIS — D509 Iron deficiency anemia, unspecified: Secondary | ICD-10-CM | POA: Diagnosis not present

## 2021-07-21 DIAGNOSIS — Z87440 Personal history of urinary (tract) infections: Secondary | ICD-10-CM | POA: Diagnosis not present

## 2021-07-21 DIAGNOSIS — R32 Unspecified urinary incontinence: Secondary | ICD-10-CM | POA: Diagnosis not present

## 2021-07-21 DIAGNOSIS — E1142 Type 2 diabetes mellitus with diabetic polyneuropathy: Secondary | ICD-10-CM | POA: Diagnosis not present

## 2021-07-21 DIAGNOSIS — F411 Generalized anxiety disorder: Secondary | ICD-10-CM | POA: Diagnosis not present

## 2021-07-21 DIAGNOSIS — E782 Mixed hyperlipidemia: Secondary | ICD-10-CM | POA: Diagnosis not present

## 2021-07-21 DIAGNOSIS — Z7984 Long term (current) use of oral hypoglycemic drugs: Secondary | ICD-10-CM | POA: Diagnosis not present

## 2021-07-21 DIAGNOSIS — Z90710 Acquired absence of both cervix and uterus: Secondary | ICD-10-CM | POA: Diagnosis not present

## 2021-07-21 DIAGNOSIS — Z85118 Personal history of other malignant neoplasm of bronchus and lung: Secondary | ICD-10-CM | POA: Diagnosis not present

## 2021-07-21 DIAGNOSIS — Z6835 Body mass index (BMI) 35.0-35.9, adult: Secondary | ICD-10-CM | POA: Diagnosis not present

## 2021-07-21 DIAGNOSIS — Z7951 Long term (current) use of inhaled steroids: Secondary | ICD-10-CM | POA: Diagnosis not present

## 2021-07-21 DIAGNOSIS — Z9181 History of falling: Secondary | ICD-10-CM | POA: Diagnosis not present

## 2021-07-21 DIAGNOSIS — M7550 Bursitis of unspecified shoulder: Secondary | ICD-10-CM | POA: Diagnosis not present

## 2021-07-21 DIAGNOSIS — I5042 Chronic combined systolic (congestive) and diastolic (congestive) heart failure: Secondary | ICD-10-CM | POA: Diagnosis not present

## 2021-07-21 DIAGNOSIS — Z9049 Acquired absence of other specified parts of digestive tract: Secondary | ICD-10-CM | POA: Diagnosis not present

## 2021-07-22 DIAGNOSIS — I110 Hypertensive heart disease with heart failure: Secondary | ICD-10-CM | POA: Diagnosis not present

## 2021-07-22 DIAGNOSIS — E1136 Type 2 diabetes mellitus with diabetic cataract: Secondary | ICD-10-CM | POA: Diagnosis not present

## 2021-07-22 DIAGNOSIS — F32A Depression, unspecified: Secondary | ICD-10-CM | POA: Diagnosis not present

## 2021-07-22 DIAGNOSIS — I5042 Chronic combined systolic (congestive) and diastolic (congestive) heart failure: Secondary | ICD-10-CM | POA: Diagnosis not present

## 2021-07-22 DIAGNOSIS — F411 Generalized anxiety disorder: Secondary | ICD-10-CM | POA: Diagnosis not present

## 2021-07-22 DIAGNOSIS — J449 Chronic obstructive pulmonary disease, unspecified: Secondary | ICD-10-CM | POA: Diagnosis not present

## 2021-07-23 ENCOUNTER — Telehealth: Payer: Self-pay | Admitting: Radiation Oncology

## 2021-07-23 DIAGNOSIS — J42 Unspecified chronic bronchitis: Secondary | ICD-10-CM | POA: Diagnosis not present

## 2021-07-23 DIAGNOSIS — J9611 Chronic respiratory failure with hypoxia: Secondary | ICD-10-CM | POA: Diagnosis not present

## 2021-07-23 DIAGNOSIS — G4733 Obstructive sleep apnea (adult) (pediatric): Secondary | ICD-10-CM | POA: Diagnosis not present

## 2021-07-23 DIAGNOSIS — C3411 Malignant neoplasm of upper lobe, right bronchus or lung: Secondary | ICD-10-CM

## 2021-07-23 DIAGNOSIS — R0902 Hypoxemia: Secondary | ICD-10-CM | POA: Diagnosis not present

## 2021-07-23 NOTE — Telephone Encounter (Signed)
I called the patient and her daughter today to review her CT scan results that Dr. Lisbeth Renshaw has reviewed as well.  She does have increased nodular density in the area of prior treatment however this could be postradiation fibrosis.  She also has trace right pleural effusion with some questionable pleural nodularity, she does not have any mediastinal adenopathy, but has 3 subcentimeter lesions in the lungs, all are measuring at 3 mm in the anterior lateral right lower lobe, posterior left upper lobe, apical segment of the left upper lobe, and are all considered new.  She has continued stigmata of atherosclerotic disease.  She continues to be followed by pulmonary medicine, cardiology, and her PCP regularly.  She reports that she continues to have a runny nose and cough because of the drainage from this as she wears oxygen.  She denies any fevers, progressive cough or purulent matter.  She denies any sinus congestion.  No other recent infections have been noted.  She states that she was given a new prescription for nitroglycerin.  She has also been having tightness in her left chest that she thought at one point could have been indigestion and was taking over-the-counter antacids for.  Regarding her lung nodules however we will plan to repeat her scan in 3 months time.  They are in agreement and will contact us sooner with questions or concerns.  Given today's phone call I will cancel her appointment for Monday so they did not have to wait through the weekend for results.

## 2021-07-24 ENCOUNTER — Telehealth: Payer: Self-pay | Admitting: Gastroenterology

## 2021-07-24 ENCOUNTER — Encounter: Payer: Self-pay | Admitting: Internal Medicine

## 2021-07-24 ENCOUNTER — Inpatient Hospital Stay (HOSPITAL_BASED_OUTPATIENT_CLINIC_OR_DEPARTMENT_OTHER): Payer: Medicare Other | Admitting: Internal Medicine

## 2021-07-24 ENCOUNTER — Inpatient Hospital Stay: Payer: Medicare Other

## 2021-07-24 DIAGNOSIS — Z79899 Other long term (current) drug therapy: Secondary | ICD-10-CM | POA: Diagnosis not present

## 2021-07-24 DIAGNOSIS — D649 Anemia, unspecified: Secondary | ICD-10-CM

## 2021-07-24 DIAGNOSIS — D509 Iron deficiency anemia, unspecified: Secondary | ICD-10-CM | POA: Diagnosis not present

## 2021-07-24 DIAGNOSIS — Z87891 Personal history of nicotine dependence: Secondary | ICD-10-CM | POA: Insufficient documentation

## 2021-07-24 DIAGNOSIS — Z85118 Personal history of other malignant neoplasm of bronchus and lung: Secondary | ICD-10-CM | POA: Insufficient documentation

## 2021-07-24 LAB — BASIC METABOLIC PANEL
Anion gap: 6 (ref 5–15)
BUN: 23 mg/dL (ref 8–23)
CO2: 33 mmol/L — ABNORMAL HIGH (ref 22–32)
Calcium: 8.6 mg/dL — ABNORMAL LOW (ref 8.9–10.3)
Chloride: 102 mmol/L (ref 98–111)
Creatinine, Ser: 0.81 mg/dL (ref 0.44–1.00)
GFR, Estimated: >60 mL/min (ref >60)
Glucose, Bld: 141 mg/dL — ABNORMAL HIGH (ref 70–99)
Potassium: 4.8 mmol/L (ref 3.5–5.1)
Sodium: 141 mmol/L (ref 135–145)

## 2021-07-24 LAB — CBC WITH DIFFERENTIAL/PLATELET
Abs Immature Granulocytes: 0.01 10*3/uL (ref 0.00–0.07)
Basophils Absolute: 0 10*3/uL (ref 0.0–0.1)
Basophils Relative: 0 %
Eosinophils Absolute: 0.1 10*3/uL (ref 0.0–0.5)
Eosinophils Relative: 2 %
HCT: 25.6 % — ABNORMAL LOW (ref 36.0–46.0)
Hemoglobin: 7.1 g/dL — ABNORMAL LOW (ref 12.0–15.0)
Immature Granulocytes: 0 %
Lymphocytes Relative: 22 %
Lymphs Abs: 1.1 10*3/uL (ref 0.7–4.0)
MCH: 26.5 pg (ref 26.0–34.0)
MCHC: 27.7 g/dL — ABNORMAL LOW (ref 30.0–36.0)
MCV: 95.5 fL (ref 80.0–100.0)
Monocytes Absolute: 0.5 10*3/uL (ref 0.1–1.0)
Monocytes Relative: 10 %
Neutro Abs: 3.1 10*3/uL (ref 1.7–7.7)
Neutrophils Relative %: 66 %
Platelets: 210 10*3/uL (ref 150–400)
RBC: 2.68 MIL/uL — ABNORMAL LOW (ref 3.87–5.11)
RDW: 14.1 % (ref 11.5–15.5)
WBC: 4.8 10*3/uL (ref 4.0–10.5)
nRBC: 0 % (ref 0.0–0.2)

## 2021-07-24 LAB — SAMPLE TO BLOOD BANK

## 2021-07-24 MED ORDER — SODIUM CHLORIDE 0.9 % IV SOLN: 200.0000 mg | Freq: Once | INTRAVENOUS | Status: DC

## 2021-07-24 MED ORDER — IRON SUCROSE 20 MG/ML IV SOLN
200.0000 mg | Freq: Once | INTRAVENOUS | Status: AC
  Administered 2021-07-24: 200 mg via INTRAVENOUS
  Filled 2021-07-24: qty 10

## 2021-07-24 MED ORDER — SODIUM CHLORIDE 0.9 % IV SOLN
Freq: Once | INTRAVENOUS | Status: AC
  Filled 2021-07-24: qty 250

## 2021-07-24 NOTE — Telephone Encounter (Signed)
Lavella Lemons, patient's daughter, called today to schedule an appointment for patient with Dr. Ardis Hughs.  His first appointment is not until July, so she was scheduled with Anderson Malta on 6/12.  Patient is requesting to see Dr. Ardis Hughs sooner, if at all possible.  She is having bleeding they are unsure of where it's coming from.  Her hemoglobin is down to 7.  Can you please call the daughter if there is any way at all to accommodate this patient sooner.  She says her mother does better with afternoon appointments, if at all possible.  Thank you.

## 2021-07-24 NOTE — Progress Notes (Signed)
Increasing fatigue with excessive sleepiness.  When standing she has to wait before walking due to feeling "lightheaded".    Scheduled for steroid inj in both knees on 08/21/21

## 2021-07-24 NOTE — Progress Notes (Signed)
[facility] NOTE  Patient Care Team: Tonia Ghent, MD as PCP - General Rockey Situ, Kathlene November, MD as Consulting Physician (Cardiology) [name], NP as Nurse Practitioner (Adult Health Nurse Practitioner) Cammie Sickle, MD as Consulting Physician (Oncology)  CHIEF COMPLAINTS/PURPOSE OF CONSULTATION: ANEMIA   HEMATOLOGY HISTORY:  # CHRONIC INTERMITTENT ANEMIA  [since 2010]- Hb SEP 2022- 8; previously on PO Iron- ; WBC/platelets- Normal; MCV- 90s. EGD- > 15 years; /Colonoscopy:4 years- [Dr.Jacob;; in [place]] capsule-? Bone marrow Biopsy-none; OCT 2022- SEVERE IRON DEFICIENCY; myeloma work-up/hemolysis work-up negative; JAN 2023-CT scan duodenal thickening-recommend GI evaluation  #   AUG 2021- RIGHT LUNG stage I non-small cell lung cancer-  s/p SBRT   [Dr.Moody; [place]]; NOV 2022- CT chest-no recurrent disease.  # COPD on 2.5 lit/day [Dr.Sood]; diastolic CHF/ OCT 9937-JIR BP [needing to come off losartan- Dr.Gollan]; peripheral neuropathy.  HISTORY OF PRESENTING ILLNESS: Alone; 2.5 Lit/ O2 24 x7. In wheel chair.    Alexandria [name] 86 y.o.  female with iron deficiency s/p venofer is here for follow-up.  Patient lost her husband recently.  Hence canceled her appointment with [place].  Also, did not call us back regarding GI evaluation.    Patient complains of increasing fatigue with excessive sleepiness.  When standing she has to wait before walking due to feeling "lightheaded".  Scheduled for steroid inj in both knees on 08/21/21.   She continues to deny any blood in stools or black-colored stools.    Review of Systems  Constitutional:  Positive for malaise/fatigue. Negative for chills, diaphoresis, fever and weight loss.  HENT:  Negative for nosebleeds and sore throat.   Eyes:  Negative for double vision.  Respiratory:  Positive for cough and shortness of breath. Negative for hemoptysis, sputum production and wheezing.   Cardiovascular:  Negative for  chest pain, palpitations, orthopnea and leg swelling.  Gastrointestinal:  Negative for abdominal pain, blood in stool, constipation, diarrhea, heartburn, melena, nausea and vomiting.  Genitourinary:  Negative for dysuria, frequency and urgency.  Musculoskeletal:  Negative for back pain and joint pain.  Skin: Negative.  Negative for itching and rash.  Neurological:  Positive for dizziness. Negative for tingling, focal weakness, weakness and headaches.  Endo/Heme/Allergies:  Does not bruise/bleed easily.  Psychiatric/Behavioral:  Negative for depression. The patient is not nervous/anxious and does not have insomnia.    MEDICAL HISTORY:  Past Medical History:  Diagnosis Date   Allergy, unspecified not elsewhere classified    Anxiety    Cataract    Chronic diastolic congestive heart failure (HCC)    COPD (chronic obstructive pulmonary disease) ([place]) 03/08/1998   PFTs 12/12/2002 FEV 1 1.42 (64%) ratio 58 with no better after B2 and DLCO75%; PFTs 11/20/09 FEV1 1.50 (73%) ratio 50 no better after B2 with DLCO 62%; Hfa 50% 11/20/2009 >75%, 01/13/10 p coaching   Depression 12/07/1998   Diabetes mellitus type II 02/05/2006   Dr. Cruzita Lederer with endo   Disorders of bursae and tendons in shoulder region, unspecified    Rotator cuff syndrome, right   E. coli bacteremia    Esophagitis    GERD (gastroesophageal reflux disease)    History of UTI    Hyperlipidemia 10/04/2000   Hypertension 03/08/1992   Hypothyroidism 03/08/1968   Iron deficiency anemia    Lung nodule    radiation starts 10-08-2019   Malignant neoplasm of right upper lobe of lung ([place]) 07/24/2019   Obesity    NOS   OSA (obstructive sleep apnea)  PSG 01/27/10 AHI 13, pt does not know CPAP settings   Peripheral neuropathy    Likely due to DM per Dr. Murvin Natal hernia     SURGICAL HISTORY: Past Surgical History:  Procedure Laterality Date   ABD U/S  03/19/1999   Nml x2 foci in liver   ADENOSINE MYOVIEW  06/02/2007   Nml    CARDIOLITE PERSANTINE  08/24/2000   Nml   CAROTID U/S  08/24/2000   1-39% ICA stenosis   CAROTID U/S  06/02/2007   No apprec change    CARPAL TUNNEL RELEASE  12/1997   Right   CESAREAN SECTION     x2 [place]/ repeat   CHOLECYSTECTOMY  1997   COLONOSCOPY WITH PROPOFOL N/A 02/12/2016   Procedure: COLONOSCOPY WITH PROPOFOL;  Surgeon: Milus Banister, MD;  Location: [facility];  Service: Endoscopy;  Laterality: N/A;   CT ABD W & PELVIS WO/W CM     Abd hemangiomas of liver, 1 cm R renal cyst   DENTAL SURGERY  2016   Implants   DEXA  07/03/2003   Nml   ESOPHAGOGASTRODUODENOSCOPY  12/05/1997   Nml (due to hoarseness)   ESOPHAGOGASTRODUODENOSCOPY (EGD) WITH PROPOFOL N/A 02/12/2016   Procedure: ESOPHAGOGASTRODUODENOSCOPY (EGD) WITH PROPOFOL;  Surgeon: Milus Banister, MD;  Location: [facility];  Service: Endoscopy;  Laterality: N/A;   GALLBLADDER SURGERY     HERNIA REPAIR  01/24/2009   Lap Ventr w/ Lysis of adhesions (Dr. Donne Hazel)   knee arthroscopic surgery  years ago   right   ROTATOR CUFF REPAIR  1984   Right, [place]   SHOULDER OPEN ROTATOR CUFF REPAIR  02/08/2012   Procedure: ROTATOR CUFF REPAIR SHOULDER OPEN;  Surgeon: Magnus Sinning, MD;  Location: [facility];  Service: Orthopedics;  Laterality: Left;  Left Shoulder Open Anterior Acrominectomy Rotator Cuff Repair Open Distal Clavicle Resection ,tissue mend graft, and repair of biceps tendon   THUMB RELEASE  12/1997   Right   TONSILLECTOMY     TOTAL ABDOMINAL HYSTERECTOMY  1985   Due to dysmennorhea   US ECHOCARDIOGRAPHY  06/02/2007    SOCIAL HISTORY: Social History   Socioeconomic History   Marital status: Widowed    Spouse name: Not on file   Number of children: 2   Years of education: Not on file   Highest education level: Not on file  Occupational History   Occupation: Retired    Fish farm manager: RETIRED  Tobacco Use   Smoking status: Former    Packs/day: 2.00    Years: 50.00    Pack years: 100.00    Types:  Cigarettes    Quit date: 02/05/2005    Years since quitting: 16.4   Smokeless tobacco: Never  Vaping Use   Vaping Use: Never used  Substance and Sexual Activity   Alcohol use: Yes    Comment: occasionally   Drug use: Never   Sexual activity: Not Currently  Other Topics Concern   Not on file  Social History Narrative   Married with 2 children; Enjoys painting      Quit smoking in 2007; no alcohol; lives in [place] with husband; [name]/d lives in [place]. Had own business. Dont drive- sec to neuropathy.    Social Determinants of Health   Financial Resource Strain: Not on file  Food Insecurity: Not on file  Transportation Needs: Not on file  Physical Activity: Not on file  Stress: Not on file  Social Connections: Not on file  Intimate Partner Violence:  Not on file    FAMILY HISTORY: Family History  Problem Relation Age of Onset   Heart disease Mother    Thyroid disease Mother    Emphysema Father        One lung   Cystic fibrosis Sister    Hyperthyroidism Sister    Osteoarthritis Brother    Hyperthyroidism Brother    Esophageal cancer Neg Hx    Cancer Neg Hx        Head or neck   Colon cancer Neg Hx    Stomach cancer Neg Hx    Breast cancer Neg Hx     ALLERGIES:  is allergic to antihistamines, diphenhydramine-type; sulfonamide derivatives; incruse ellipta [umeclidinium bromide]; clarithromycin; codeine; lyrica [pregabalin]; spiriva handihaler [tiotropium bromide monohydrate]; and vraylar [cariprazine].  MEDICATIONS:  Current Outpatient Medications  Medication Sig Dispense Refill   albuterol (PROAIR HFA) 108 (90 Base) MCG/ACT inhaler Inhale 2 puffs into the lungs every 6 (six) hours as needed for wheezing or shortness of breath. INHALE 2 PUFFS INTO THE LUNGS EVERY 6 HOURS AS NEEDED FOR WHEEZING OR SHORTNESS OF BREATH 3 Inhaler 3   albuterol (PROVENTIL) (2.5 MG/3ML) 0.083% nebulizer solution Take 3 mLs (2.5 mg total) by nebulization every 6 (six) hours as needed for  wheezing or shortness of breath. 1080 mL 1   ALPRAZolam (XANAX) 0.25 MG tablet Take 1 tablet (0.25 mg total) by mouth 2 (two) times daily as needed for anxiety (sedation caution). 10 tablet 0   budesonide-formoterol (SYMBICORT) 80-4.5 MCG/ACT inhaler Inhale 2 puffs into the lungs 2 (two) times daily. 1 each 12   busPIRone (BUSPAR) 15 MG tablet Take 15 mg by mouth 2 (two) times daily.     Cholecalciferol (VITAMIN D) 50 MCG (2000 UT) tablet Take 1 tablet (2,000 Units total) by mouth daily.     Coenzyme Q-10 200 MG CAPS Take 200 mg by mouth daily.     Cyanocobalamin (B-12) 5000 MCG CAPS Take 5,000 mcg by mouth daily.     cyclobenzaprine (FLEXERIL) 10 MG tablet Take 1 tablet (10 mg total) by mouth at bedtime. 10 tablet 0   ezetimibe (ZETIA) 10 MG tablet TAKE 1 TABLET BY MOUTH AT BEDTIME 90 tablet 2   fexofenadine (ALLEGRA) 180 MG tablet Take 180 mg by mouth as needed.      FLUoxetine (PROZAC) 20 MG tablet Take 20 mg by mouth 2 (two) times daily.     fluticasone (FLONASE) 50 MCG/ACT nasal spray USE 2 SPRAYS INTO THE NOSE EVERY 12 HOURS AS NEEDED FOR STUFFY NOSE 48 mL 2   furosemide (LASIX) 20 MG tablet TAKE 1 TABLET (20 MG TOTAL) BY MOUTH DAILY AS NEEDED FOR FLUID. 90 tablet 1   glucose blood (ONETOUCH VERIO) test strip Use as instructed to check sugar 2-3x time daily 200 each 5   Iron, Ferrous Sulfate, 325 (65 Fe) MG TABS Take 325 mg by mouth daily. 90 tablet 3   metFORMIN (GLUCOPHAGE) 500 MG tablet Take 1 tablet (500 mg total) by mouth daily with supper. 90 tablet 3   nitroGLYCERIN (NITROSTAT) 0.4 MG SL tablet Place 1 tablet (0.4 mg total) under the tongue every 5 (five) minutes as needed for chest pain. 25 tablet 3   ONETOUCH DELICA LANCETS FINE MISC USE TO CHECK SUGAR 1 TIME DAILY 100 each 5   ONETOUCH VERIO test strip USE AS INSTRUCTED TO CHECK SUGAR 1 TIME DAILY 100 strip 8   rosuvastatin (CRESTOR) 10 MG tablet TAKE 1 TABLET (10 MG TOTAL) BY  MOUTH EVERY OTHER DAY. 45 tablet 2   Spacer/Aero  Chamber Mouthpiece MISC Use with  inhaler as needed.  J44.9 1 each 0   SYNTHROID 150 MCG tablet TAKE 1 TABLET BY MOUTH DAILY BEFORE BREAKFAST. 90 tablet 2   No current facility-administered medications for this visit.      PHYSICAL EXAMINATION:   Vitals:   07/24/21 1300  BP: (!) 128/57  Pulse: 79  Resp: 18  Temp: (!) 97.3 F (36.3 C)  SpO2: 100%   Filed Weights   07/24/21 1300  Weight: 219 lb (99.3 kg)    Physical Exam Vitals and nursing note reviewed.  HENT:     Head: Normocephalic and atraumatic.     Mouth/Throat:     Pharynx: Oropharynx is clear.  Eyes:     Extraocular Movements: Extraocular movements intact.     Pupils: Pupils are equal, round, and reactive to light.  Cardiovascular:     Rate and Rhythm: Normal rate and regular rhythm.  Pulmonary:     Comments: Decreased breath sounds bilaterally.  Scattered wheezing. Abdominal:     Palpations: Abdomen is soft.  Musculoskeletal:        General: Normal range of motion.     Cervical back: Normal range of motion.  Skin:    General: Skin is warm.  Neurological:     General: No focal deficit present.     Mental Status: She is alert and oriented to person, place, and time.  Psychiatric:        Behavior: Behavior normal.        Judgment: Judgment normal.    LABORATORY DATA:  I have reviewed the data as listed Lab Results  Component Value Date   WBC 4.8 07/24/2021   HGB 7.1 (L) 07/24/2021   HCT 25.6 (L) 07/24/2021   MCV 95.5 07/24/2021   PLT 210 07/24/2021   Recent Labs    09/12/20 0952 09/12/20 0952 12/16/20 1209 01/15/21 1143 01/28/21 1244 03/17/21 1616 03/25/21 1416 06/08/21 1547 07/24/21 1252  NA 143  --  140  --  141  --  139 142 141  K 4.3  --  4.5  --  4.6  --  4.3 4.7 4.8  CL 103  --  100  --  100  --  100 103 102  CO2 35*  --  33*  --  34*  --  35* 31 33*  GLUCOSE 157*  --  122*  --  119*  --  151* 91 141*  BUN 29*  --  24*   < > 22  --  26* 21 23  CREATININE 0.98  --  0.74   < >  0.79   < > 0.89 0.80 0.81  CALCIUM 9.1  --  8.9  --  8.7*  --  8.5* 9.6 8.6*  GFRNONAA  --    < > >60   < > >60  --  >60  --  >60  PROT 6.7  --  6.5  --   --   --   --  6.9  --   ALBUMIN 3.9  --  3.4*  --   --   --   --  3.9  --   AST 10  --  14*  --   --   --   --  13  --   ALT 8  --  12  --   --   --   --  7  --  ALKPHOS 87  --  74  --   --   --   --  67  --   BILITOT 0.3  --  0.2*  --   --   --   --  0.4  --    < > = values in this interval not displayed.     CT CHEST W CONTRAST  Result Date: 07/17/2021 CLINICAL DATA:  Restaging non-small cell lung cancer. Status post SBRT. EXAM: CT CHEST WITH CONTRAST TECHNIQUE: Multidetector CT imaging of the chest was performed during intravenous contrast administration. RADIATION DOSE REDUCTION: This exam was performed according to the departmental dose-optimization program which includes automated exposure control, adjustment of the mA and/or kV according to patient size and/or use of iterative reconstruction technique. CONTRAST:  50m OMNIPAQUE IOHEXOL 300 MG/ML  SOLN COMPARISON:  CT chest 01/15/2021. FINDINGS: Cardiovascular: Heart size is normal. No pericardial effusion. Aortic atherosclerosis and coronary artery calcifications. Mediastinum/Nodes: No enlarged supraclavicular, axillary, mediastinal or hilar lymph nodes. Lungs/Pleura: No pleural effusion or airspace consolidation. Within the right upper lobe there is an area of nodular architectural distortion, ground-glass attenuation and fibrosis compatible with changes due to external beam radiation. Within this area there is the a new area of nodular density which measures 1.3 x 1.1 cm, image 30/7. In the absence of interval radiation therapy recurrent tumor cannot be excluded. There is a new tiny right pleural effusion as well as new mild pleural nodularity along the lateral right lung base, image 21/7. Scattered tiny peripheral predominant lung nodules are noted which appear new from previous exam. For  example, within the anterolateral right lower lobe new nodule measures 3 mm, image 103/7. Within the posterior left upper lobe new nodule measures 3 mm, image 62/7. Within the apical segment of the left upper lobe there is a new nodule measuring 3 mm, image 32/3. Upper Abdomen: No acute abnormality within the imaged portions of the upper abdomen. Exophytic hemangioma arising off the lateral segment of left hepatic lobe is unchanged measuring 3.8 x 2.7 cm. Bilateral low-attenuation Bosniak class 1 kidney cysts are identified. No follow-up recommended. Musculoskeletal: No acute or suspicious bone lesions. Sclerotic lesion with narrow zone of transition within the lateral aspect of the left seventh rib is stable compatible with a benign bone island. IMPRESSION: 1. There is an area of nodular architectural distortion, ground-glass attenuation and fibrosis within the right upper lobe compatible with changes due to external beam radiation. Within this area there is a new area of nodular density which measures 1.3 x 1.1 cm. In the absence of interval radiation therapy recurrent tumor cannot be excluded. Consider further evaluation with PET-CT. 2. New tiny right pleural effusion with new mild pleural nodularity along the lateral right lung base. 3. Scattered tiny peripheral predominant lung nodules are noted which appear new from previous exam. The appearance of these nodules is nonspecific but pulmonary metastasis would be the diagnosis of exclusion. 4. Aortic Atherosclerosis (ICD10-I70.0). Electronically Signed   By: TKerby MoorsM.D.   On: 07/17/2021 13:54    Symptomatic anemia  # Iron deficiency Anemia [OCT 2022- Ferritin-7]; Symptomatic -chronic intermittent [SINCE 2010]; s/p IV Venofer.  # Worsening anemia: Hemoglobin- 7.1.  proceed with venofer.   # ETIOLOGY:  Discussed with patient/daughter that etiology of iron deficient unclear; however. CT CT AP- JAN- 11th, 2023- Mild wall thickening of the proximal  duodenum at least in part related to under distension, may reflect peptic ulcer disease/duodenitis. Given patient's tenuous cardiorespiratory status initially reluctant  with GI evaluation.  However today-patient agrees to GI referral-most recently postponed because of her husband's demise.  We will make a referral to GI [place]/patient preference    # DISPOSITION: [wed pref] # referral to [place]  -GI re: Dr.Jacobs/ [place] re: Anemia # Venofer today # venofer weekly x 3 ## follow up- 2 m- MD; cbc/bmp; Iron studies/ferritin-- venofer- Dr.B    All questions were answered. The patient knows to call the clinic with any problems, questions or concerns.    Cammie Sickle, MD 07/24/2021 1:53 PM

## 2021-07-24 NOTE — Assessment & Plan Note (Addendum)
#   Iron deficiency Anemia [OCT 2022- Ferritin-7]; Symptomatic -chronic intermittent [SINCE 2010]; s/p IV Venofer.  # Worsening anemia: Hemoglobin- 7.1.  proceed with venofer.   # ETIOLOGY:  Discussed with patient/daughter that etiology of iron deficient unclear; however. CT CT AP- JAN- 11th, 2023- Mild wall thickening of the proximal duodenum at least in part related to under distension, may reflect peptic ulcer disease/duodenitis. Given patient's tenuous cardiorespiratory status initially reluctant with GI evaluation.  However today-patient agrees to GI referral-most recently postponed because of her husband's demise.  We will make a referral to GI [place]/patient preference    # DISPOSITION: [wed pref] # referral to [facility]  -GI re: Dr.Jacobs/ [place] re: Anemia # Venofer today # venofer weekly x 3 ## follow up- 2 m- MD; cbc/bmp; Iron studies/ferritin-- venofer- Dr.B

## 2021-07-24 NOTE — Patient Instructions (Signed)
[facility]  Discharge Instructions: ?Thank you for choosing [facility] to provide your oncology and hematology care.  ?If you have a lab appointment with the Cancer Center, please go directly to the Cancer Center and check in at the registration area. ? ?Wear comfortable clothing and clothing appropriate for easy access to any Portacath or PICC line.  ? ?We strive to give you quality time with your provider. You may need to reschedule your appointment if you arrive late (15 or more minutes).  Arriving late affects you and other patients whose appointments are after yours.  Also, if you miss three or more appointments without notifying the office, you may be dismissed from the clinic at the provider?s discretion.    ?  ?For prescription refill requests, have your pharmacy contact our office and allow 72 hours for refills to be completed.   ? ?Today you received the following chemotherapy and/or immunotherapy agents VENOFER ?    ?  ?To help prevent nausea and vomiting after your treatment, we encourage you to take your nausea medication as directed. ? ?BELOW ARE SYMPTOMS THAT SHOULD BE REPORTED IMMEDIATELY: ?*FEVER GREATER THAN 100.4 F (38 ?C) OR HIGHER ?*CHILLS OR SWEATING ?*NAUSEA AND VOMITING THAT IS NOT CONTROLLED WITH YOUR NAUSEA MEDICATION ?*UNUSUAL SHORTNESS OF BREATH ?*UNUSUAL BRUISING OR BLEEDING ?*URINARY PROBLEMS (pain or burning when urinating, or frequent urination) ?*BOWEL PROBLEMS (unusual diarrhea, constipation, pain near the anus) ?TENDERNESS IN MOUTH AND THROAT WITH OR WITHOUT PRESENCE OF ULCERS (sore throat, sores in mouth, or a toothache) ?UNUSUAL RASH, SWELLING OR PAIN  ?UNUSUAL VAGINAL DISCHARGE OR ITCHING  ? ?Items with * indicate a potential emergency and should be followed up as soon as possible or go to the Emergency Department if any problems should occur. ? ?Please show the CHEMOTHERAPY ALERT CARD or IMMUNOTHERAPY ALERT CARD at check-in to  the Emergency Department and triage nurse. ? ?Should you have questions after your visit or need to cancel or reschedule your appointment, please contact [facility]  [phone] and follow the prompts.  Office hours are 8:00 a.m. to 4:30 p.m. Monday - Friday. Please note that voicemails left after 4:00 p.m. may not be returned until the following business day.  We are closed weekends and major holidays. You have access to a nurse at all times for urgent questions. Please call the main number to the clinic [phone] and follow the prompts. ? ?For any non-urgent questions, you may also contact your provider using MyChart. We now offer e-Visits for anyone 18 and older to request care online for non-urgent symptoms. For details visit [email or web address]. ?  ?Also download the MyChart app! Go to the app store, search "MyChart", open the app, select [facility], and log in with your MyChart username and password. ? ?Due to Covid, a mask is required upon entering the hospital/clinic. If you do not have a mask, one will be given to you upon arrival. For doctor visits, patients may have 1 support person aged 18 or older with them. For treatment visits, patients cannot have anyone with them due to current Covid guidelines and our immunocompromised population.  ? ?Iron Sucrose Injection ?What is this medication? ?IRON SUCROSE (EYE ern SOO krose) treats low levels of iron (iron deficiency anemia) in people with kidney disease. Iron is a mineral that plays an important role in making red blood cells, which carry oxygen from your lungs to the rest of your body. ?This medicine   may be used for other purposes; ask your health care provider or pharmacist if you have questions. ?COMMON BRAND NAME(S): Venofer ?What should I tell my care team before I take this medication? ?They need to know if you have any of these conditions: ?Anemia not caused by low iron levels ?Heart disease ?High levels of  iron in the blood ?Kidney disease ?Liver disease ?An unusual or allergic reaction to iron, other medications, foods, dyes, or preservatives ?Pregnant or trying to get pregnant ?Breast-feeding ?How should I use this medication? ?This medication is for infusion into a vein. It is given in a hospital or clinic setting. ?Talk to your care team about the use of this medication in children. While this medication may be prescribed for children as young as 2 years for selected conditions, precautions do apply. ?Overdosage: If you think you have taken too much of this medicine contact a poison control center or emergency room at once. ?NOTE: This medicine is only for you. Do not share this medicine with others. ?What if I miss a dose? ?It is important not to miss your dose. Call your care team if you are unable to keep an appointment. ?What may interact with this medication? ?Do not take this medication with any of the following: ?Deferoxamine ?Dimercaprol ?Other iron products ?This medication may also interact with the following: ?Chloramphenicol ?Deferasirox ?This list may not describe all possible interactions. Give your health care provider a list of all the medicines, herbs, non-prescription drugs, or dietary supplements you use. Also tell them if you smoke, drink alcohol, or use illegal drugs. Some items may interact with your medicine. ?What should I watch for while using this medication? ?Visit your care team regularly. Tell your care team if your symptoms do not start to get better or if they get worse. You may need blood work done while you are taking this medication. ?You may need to follow a special diet. Talk to your care team. Foods that contain iron include: whole grains/cereals, dried fruits, beans, or peas, leafy green vegetables, and organ meats (liver, kidney). ?What side effects may I notice from receiving this medication? ?Side effects that you should report to your care team as soon as  possible: ?Allergic reactions--skin rash, itching, hives, swelling of the face, lips, tongue, or throat ?Low blood pressure--dizziness, feeling faint or lightheaded, blurry vision ?Shortness of breath ?Side effects that usually do not require medical attention (report to your care team if they continue or are bothersome): ?Flushing ?Headache ?Joint pain ?Muscle pain ?Nausea ?Pain, redness, or irritation at injection site ?This list may not describe all possible side effects. Call your doctor for medical advice about side effects. You may report side effects to FDA at 1-800-FDA-1088. ?Where should I keep my medication? ?This medication is given in a hospital or clinic and will not be stored at home. ?NOTE: This sheet is a summary. It may not cover all possible information. If you have questions about this medicine, talk to your doctor, pharmacist, or health care provider. ?? 2023 Elsevier/Gold Standard (2020-07-18 00:00:00) ? ?

## 2021-07-24 NOTE — Telephone Encounter (Signed)
Called and spoke to patient. She requested that I call her daughter Lavella Lemons. ATC [name]. LVM asking her to call back or call the [place] office to have appt scheduled.

## 2021-07-26 NOTE — Telephone Encounter (Signed)
Chesley Mires, MD  You; Dara Lords, Rex Kras, PA-C; Bolivar, Meagan D, CMA; Minna Merritts, MD 3 days ago   Effusion is tiny.  Don't think we need to do anything now, but she will need short term follow up chest xray to make sure the effusion isn't getting any bigger.   Meagan, can you schedule an appointment with me or one of the NP's early in June, and she will need a chest xray at the time of her visit.   ================ Appreciate pulmonary input and I'll defer.

## 2021-07-27 ENCOUNTER — Encounter: Payer: Self-pay | Admitting: Family Medicine

## 2021-07-27 ENCOUNTER — Ambulatory Visit: Payer: Medicare Other | Admitting: Radiation Oncology

## 2021-07-27 NOTE — Telephone Encounter (Signed)
The pt daughter returned call and states that she had already called and made an appt to see Anderson Malta on 6/12.  She has followed up with PCP and has venefer infusions scheduled.  Hgb 7.1 three days ago.  I offered to find a sooner appt with app but the daughter declines and prefers to keep appt as planned.

## 2021-07-27 NOTE — Telephone Encounter (Signed)
Left message on machine to call back   Has PCP reviewed the results?

## 2021-07-28 DIAGNOSIS — H2513 Age-related nuclear cataract, bilateral: Secondary | ICD-10-CM | POA: Diagnosis not present

## 2021-07-28 DIAGNOSIS — H04123 Dry eye syndrome of bilateral lacrimal glands: Secondary | ICD-10-CM | POA: Diagnosis not present

## 2021-07-28 DIAGNOSIS — H10413 Chronic giant papillary conjunctivitis, bilateral: Secondary | ICD-10-CM | POA: Diagnosis not present

## 2021-07-28 NOTE — Telephone Encounter (Signed)
Appt with [facility] NP was scheduled by front desk staff. Nothing further needed

## 2021-07-29 ENCOUNTER — Inpatient Hospital Stay: Payer: Medicare Other

## 2021-07-29 VITALS — BP 138/47 | HR 72 | Temp 98.3°F

## 2021-07-29 DIAGNOSIS — Z79899 Other long term (current) drug therapy: Secondary | ICD-10-CM | POA: Diagnosis not present

## 2021-07-29 DIAGNOSIS — Z85118 Personal history of other malignant neoplasm of bronchus and lung: Secondary | ICD-10-CM | POA: Diagnosis not present

## 2021-07-29 DIAGNOSIS — D509 Iron deficiency anemia, unspecified: Secondary | ICD-10-CM | POA: Diagnosis not present

## 2021-07-29 DIAGNOSIS — D649 Anemia, unspecified: Secondary | ICD-10-CM

## 2021-07-29 DIAGNOSIS — Z87891 Personal history of nicotine dependence: Secondary | ICD-10-CM | POA: Diagnosis not present

## 2021-07-29 MED ORDER — SODIUM CHLORIDE 0.9 % IV SOLN
Freq: Once | INTRAVENOUS | Status: AC
  Filled 2021-07-29: qty 250

## 2021-07-29 MED ORDER — IRON SUCROSE 20 MG/ML IV SOLN
200.0000 mg | Freq: Once | INTRAVENOUS | Status: AC
  Administered 2021-07-29: 200 mg via INTRAVENOUS
  Filled 2021-07-29: qty 10

## 2021-07-29 MED ORDER — SODIUM CHLORIDE 0.9 % IV SOLN: 200.0000 mg | Freq: Once | INTRAVENOUS | Status: DC

## 2021-07-29 NOTE — Patient Instructions (Signed)

## 2021-07-30 ENCOUNTER — Telehealth: Payer: Self-pay | Admitting: Family Medicine

## 2021-07-30 ENCOUNTER — Other Ambulatory Visit: Payer: Self-pay | Admitting: Family Medicine

## 2021-07-30 NOTE — Telephone Encounter (Signed)
Spoke with patient to schedule AWV.  She wanted a call back after 08/06/21

## 2021-08-04 ENCOUNTER — Ambulatory Visit: Payer: Medicare Other

## 2021-08-04 DIAGNOSIS — F32A Depression, unspecified: Secondary | ICD-10-CM | POA: Diagnosis not present

## 2021-08-04 DIAGNOSIS — I110 Hypertensive heart disease with heart failure: Secondary | ICD-10-CM | POA: Diagnosis not present

## 2021-08-04 DIAGNOSIS — E1136 Type 2 diabetes mellitus with diabetic cataract: Secondary | ICD-10-CM | POA: Diagnosis not present

## 2021-08-04 DIAGNOSIS — I5042 Chronic combined systolic (congestive) and diastolic (congestive) heart failure: Secondary | ICD-10-CM | POA: Diagnosis not present

## 2021-08-04 DIAGNOSIS — J449 Chronic obstructive pulmonary disease, unspecified: Secondary | ICD-10-CM | POA: Diagnosis not present

## 2021-08-04 DIAGNOSIS — F411 Generalized anxiety disorder: Secondary | ICD-10-CM | POA: Diagnosis not present

## 2021-08-05 ENCOUNTER — Telehealth: Payer: Self-pay | Admitting: *Deleted

## 2021-08-05 ENCOUNTER — Inpatient Hospital Stay: Payer: Medicare Other

## 2021-08-05 VITALS — BP 138/40 | HR 75 | Temp 98.7°F | Resp 18

## 2021-08-05 DIAGNOSIS — Z87891 Personal history of nicotine dependence: Secondary | ICD-10-CM | POA: Diagnosis not present

## 2021-08-05 DIAGNOSIS — D509 Iron deficiency anemia, unspecified: Secondary | ICD-10-CM | POA: Diagnosis not present

## 2021-08-05 DIAGNOSIS — Z85118 Personal history of other malignant neoplasm of bronchus and lung: Secondary | ICD-10-CM | POA: Diagnosis not present

## 2021-08-05 DIAGNOSIS — D649 Anemia, unspecified: Secondary | ICD-10-CM

## 2021-08-05 DIAGNOSIS — Z79899 Other long term (current) drug therapy: Secondary | ICD-10-CM | POA: Diagnosis not present

## 2021-08-05 MED ORDER — IRON SUCROSE 20 MG/ML IV SOLN
200.0000 mg | Freq: Once | INTRAVENOUS | Status: AC
  Administered 2021-08-05: 200 mg via INTRAVENOUS
  Filled 2021-08-05: qty 10

## 2021-08-05 MED ORDER — SODIUM CHLORIDE 0.9 % IV SOLN
Freq: Once | INTRAVENOUS | Status: AC
  Filled 2021-08-05: qty 250

## 2021-08-05 MED ORDER — SODIUM CHLORIDE 0.9 % IV SOLN: 200.0000 mg | Freq: Once | INTRAVENOUS | Status: DC

## 2021-08-05 NOTE — Patient Instructions (Signed)

## 2021-08-05 NOTE — Telephone Encounter (Signed)
RETURNED PATIENT'S DAUGHTER (LATONYA ) PHONE CALL, LVM FOR A RETURN CALL

## 2021-08-06 ENCOUNTER — Encounter: Payer: Self-pay | Admitting: Nurse Practitioner

## 2021-08-06 ENCOUNTER — Telehealth: Payer: Self-pay

## 2021-08-06 ENCOUNTER — Ambulatory Visit (INDEPENDENT_AMBULATORY_CARE_PROVIDER_SITE_OTHER): Payer: Medicare Other | Admitting: Nurse Practitioner

## 2021-08-06 ENCOUNTER — Other Ambulatory Visit (INDEPENDENT_AMBULATORY_CARE_PROVIDER_SITE_OTHER): Payer: Medicare Other

## 2021-08-06 VITALS — BP 130/72 | HR 79 | Temp 98.4°F | Ht 64.0 in | Wt 218.2 lb

## 2021-08-06 DIAGNOSIS — I5032 Chronic diastolic (congestive) heart failure: Secondary | ICD-10-CM

## 2021-08-06 DIAGNOSIS — R0609 Other forms of dyspnea: Secondary | ICD-10-CM | POA: Diagnosis not present

## 2021-08-06 DIAGNOSIS — J449 Chronic obstructive pulmonary disease, unspecified: Secondary | ICD-10-CM

## 2021-08-06 DIAGNOSIS — E039 Hypothyroidism, unspecified: Secondary | ICD-10-CM

## 2021-08-06 DIAGNOSIS — J9611 Chronic respiratory failure with hypoxia: Secondary | ICD-10-CM | POA: Diagnosis not present

## 2021-08-06 DIAGNOSIS — D649 Anemia, unspecified: Secondary | ICD-10-CM | POA: Diagnosis not present

## 2021-08-06 DIAGNOSIS — E782 Mixed hyperlipidemia: Secondary | ICD-10-CM | POA: Diagnosis not present

## 2021-08-06 DIAGNOSIS — C3411 Malignant neoplasm of upper lobe, right bronchus or lung: Secondary | ICD-10-CM | POA: Diagnosis not present

## 2021-08-06 LAB — BASIC METABOLIC PANEL
BUN: 22 mg/dL (ref 6–23)
CO2: 33 mEq/L — ABNORMAL HIGH (ref 19–32)
Calcium: 9.3 mg/dL (ref 8.4–10.5)
Chloride: 104 mEq/L (ref 96–112)
Creatinine, Ser: 0.82 mg/dL (ref 0.40–1.20)
GFR: 65.91 mL/min (ref >60.00)
Glucose, Bld: 114 mg/dL — ABNORMAL HIGH (ref 70–99)
Potassium: 4.4 mEq/L (ref 3.5–5.1)
Sodium: 142 mEq/L (ref 135–145)

## 2021-08-06 LAB — CBC WITH DIFFERENTIAL/PLATELET
Basophils Absolute: 0 10*3/uL (ref 0.0–0.1)
Basophils Relative: 0.8 % (ref 0.0–3.0)
Eosinophils Absolute: 0.1 10*3/uL (ref 0.0–0.7)
Eosinophils Relative: 1.7 % (ref 0.0–5.0)
HCT: 25.9 % — ABNORMAL LOW (ref 36.0–46.0)
Hemoglobin: 8 g/dL — CL (ref 12.0–15.0)
Lymphocytes Relative: 26.6 % (ref 12.0–46.0)
Lymphs Abs: 1.3 10*3/uL (ref 0.7–4.0)
MCHC: 31.1 g/dL (ref 30.0–36.0)
MCV: 89.1 fl (ref 78.0–100.0)
Monocytes Absolute: 0.4 10*3/uL (ref 0.1–1.0)
Monocytes Relative: 8.9 % (ref 3.0–12.0)
Neutro Abs: 3.1 10*3/uL (ref 1.4–7.7)
Neutrophils Relative %: 62 % (ref 43.0–77.0)
Platelets: 218 10*3/uL (ref 150.0–400.0)
RBC: 2.91 Mil/uL — ABNORMAL LOW (ref 3.87–5.11)
RDW: 18.3 % — ABNORMAL HIGH (ref 11.5–15.5)
WBC: 5 10*3/uL (ref 4.0–10.5)

## 2021-08-06 LAB — LIPID PANEL
Cholesterol: 140 mg/dL (ref 0–200)
HDL: 57.8 mg/dL (ref >39.00)
LDL Cholesterol: 62 mg/dL (ref 0–99)
NonHDL: 81.74
Total CHOL/HDL Ratio: 2
Triglycerides: 99 mg/dL (ref 0.0–149.0)
VLDL: 19.8 mg/dL (ref 0.0–40.0)

## 2021-08-06 LAB — IRON: Iron: 69 ug/dL (ref 42–145)

## 2021-08-06 LAB — TSH: TSH: 1.24 u[IU]/mL (ref 0.35–5.50)

## 2021-08-06 LAB — BRAIN NATRIURETIC PEPTIDE: Pro B Natriuretic peptide (BNP): 169 pg/mL — ABNORMAL HIGH (ref 0.0–100.0)

## 2021-08-06 MED ORDER — BUDESONIDE 0.5 MG/2ML IN SUSP: 0.5000 mg | Freq: Two times a day (BID) | RESPIRATORY_TRACT | 3 refills | Status: DC

## 2021-08-06 MED ORDER — ARFORMOTEROL TARTRATE 15 MCG/2ML IN NEBU: 15.0000 ug | INHALATION_SOLUTION | Freq: Two times a day (BID) | RESPIRATORY_TRACT | 3 refills | Status: DC

## 2021-08-06 MED ORDER — LEVALBUTEROL HCL 0.63 MG/3ML IN NEBU: 0.6300 mg | INHALATION_SOLUTION | Freq: Four times a day (QID) | RESPIRATORY_TRACT | 5 refills | Status: DC | PRN

## 2021-08-06 NOTE — Assessment & Plan Note (Signed)
High symptom burden with progressive DOE over the last 1-2 months. Disease poorly controlled; does not seem to be having an acute exacerbation as symptoms are overall stable so will hold off on any prednisone, abx or further imaging. Recommended that we try her on dual therapy nebs (has been intolerant to LAMAs in past) - agreeable to this plan. Change PRN albuterol nebs to levalbuterol to see if this helps with her jitteriness.   Patient Instructions  Stop Symbicort.  Stop albuterol nebs. Switch to levalbuterol (Xopenex) 3 mL every 6 hours as needed for shortness of breath or wheezing.  Continue allegra 180 mg daily as needed for allergies  Continue flonase 2 sprays each nostril daily for allergies/runny nose/nasal congestion  Start Brovana neb 2 mL Twice daily  Start budesonide neb 2 mL Twice daily. Brush tongue and rinse mouth afterwards Increase lasix to 40 mg daily for 5 days. Take in the morning Compression stockings - apply in AM and remove at night  Labs today - BMET and BNP  Follow up with radiation oncology as scheduled.   Follow up with Dr. Halford Chessman on 6/20 as scheduled. If symptoms do not improve or worsen, please contact office for sooner follow up or seek emergency care.

## 2021-08-06 NOTE — Assessment & Plan Note (Signed)
Status post empiric SBRT. CT chest in May with new nodular opacity in RUL. Plans for repeat in August. Follow up with oncology as scheduled.

## 2021-08-06 NOTE — Telephone Encounter (Signed)
Critical results called by Marisue Brooklyn at [facility] lab.  Reported in person to [name].  Critical low Hemoglobin 8.0

## 2021-08-06 NOTE — Assessment & Plan Note (Signed)
She did increase herself to 3 lpm due to increase SOB. She has not had any desaturations at home when she has checked and maintains in the 90's. Advised not to titrate unless she is not maintaining >88-90%.

## 2021-08-06 NOTE — Assessment & Plan Note (Signed)
Worsening BLE edema. No evidence of pulmonary edema on recent CT but she did have a small right pleural effusion - could be from fluid overload vs malignant. Will trial lasix course of 40 mg for 5 days. Check BMET and BNP. If BNP elevated, will reach out to cardiology for further management. She has plans to follow up with her PCP Monday

## 2021-08-06 NOTE — Assessment & Plan Note (Addendum)
Progressive over the last 1-2 months and likely multifactorial. She has evidence of fluid overload with small pleural effusion and BLE edema - will trial lasix 40 mg for 5 days; check BMET and BNP. Follow up recommended with PCP. She also had a new nodular density within the area of previous radiation on her staging CT from May. She has seen oncology and they plan to repeat imaging in August for 3 month follow up. I am concerned that this could be recurrence of her disease and contributing to her symptoms. We will see how she does with current changes but if no improvement, will reach out to oncology team to see if we can repeat imaging sooner or obtain PET. She also has a known anemia, which likely contributes to her symptoms but her hgb is unchanged from 6 months ago so doesn't correlate to worsening over the last 1-2 months.

## 2021-08-06 NOTE — Patient Instructions (Addendum)
Stop Symbicort.  Stop albuterol nebs. Switch to levalbuterol (Xopenex) 3 mL every 6 hours as needed for shortness of breath or wheezing.  Continue allegra 180 mg daily as needed for allergies  Continue flonase 2 sprays each nostril daily for allergies/runny nose/nasal congestion  Start Brovana neb 2 mL Twice daily  Start budesonide neb 2 mL Twice daily. Brush tongue and rinse mouth afterwards Increase lasix to 40 mg daily for 5 days. Take in the morning Compression stockings - apply in AM and remove at night  Labs today - BMET and BNP  Follow up with radiation oncology as scheduled.   Follow up with Dr. Halford Chessman on 6/20 as scheduled. If symptoms do not improve or worsen, please contact office for sooner follow up or seek emergency care.

## 2021-08-06 NOTE — Telephone Encounter (Signed)
This Hgb is improved and she is in the midst of venofer infusions per hematology so it makes sense to continue as is.  I routed this to Dr. Rogue Bussing as Juluis Rainier but I don't think it will change the plan.  I thank all involved.

## 2021-08-06 NOTE — Progress Notes (Addendum)
@Patient  ID: Alexandria [name], female    DOB: Jan 15, 1936, 86 y.o.   MRN: [id number]  Chief Complaint  Patient presents with   Follow-up    Pt is here for issues with her breathing getting worse. Pt states she was placed on a neb medication by her nurses that come out with her knee. Pt states the Albuterol neb makes her shakey. Pt states she turned up her oxygen to 3L now. And she is on Symbicort daily and Albuterol as needed. Husband just passed recently and this started after last month visit.     Referring provider: Tonia Ghent, MD  HPI: 86 year old female, former smoker followed for COPD and chronic respiratory failure.  She also has a history of OSA but intolerant of CPAP.  She is a patient of Dr. Juanetta Gosling and last seen in office on 02/03/2021.  Past medical history significant for CHF, IDA, hypertension, CAD, PAD, hypothyroidism, DM 2, GERD, HLD, obesity.  She also has a history of right upper lobe lung neoplasm status post SBRT and followed by radiation oncology.  TEST/EVENTS:  07/15/2021 CT chest with contrast: Atherosclerosis.  Within the right upper lobe there is a area of nodular architectural distortion, groundglass attenuation and fibrosis compatible with changes due to external beam radiation.  Within this there is a new area of nodular density which measures 1.3 x 1.1 cm.  In the absence of interval radiation therapy, recurrent tumor cannot be excluded.  There is a new tiny right pleural effusion as well.  There is scattered tiny peripheral predominant lung nodules which appear new from previous exam.   02/03/2021: OV with Dr. Halford Chessman.  Treated for AECOPD with prednisone taper and Augmentin.  She has been intolerant of LAMA's in the past.  Advair caused throat irritation and increased cough.  Recommended to try Symbicort with spacer.  08/06/2021: Today - follow up Patient presents today for follow up. Her daughter is on the phone during the visit and helps to provide history. Her  husband passed away at the beginning of this year and she has been having difficulties with her health since then. They report that over the last 1-2 months, she has had progressive DOE. Was seen by her oncologist and recommended to come back to pulmonary for further management. She did have a CT scan for staging that showed a new area of nodular density measuring 1.3x1.1 cm, concerning for recurrence of disease. They are planning to obtain repeat CT chest in August for 3 month follow up. Today, she reports that her breathing is stable when compared to her scan on 5/10. She continues to have a daily productive cough with clear sputum, which has not increased. She was told by her home health nurse that she has wheezing and they were able to get her an albuterol neb which she uses 1-2 times a day. She was only doing half the solution due to it making her very jittery. She has also noticed worsening swelling in her legs. She has lasix 20 mg daily that she can use as needed for swelling but hasn't noticed much relief. She denies hemoptysis, anorexia, weight loss, fevers, night sweats. She is not on any maintenance therapies - stopped the Symbicort as it made her cough. She does like the breathing treatments, aside from the jitteriness, and feels like this works well for her.   Allergies  Allergen Reactions   Antihistamines, Diphenhydramine-Type Other (See Comments)    Able to tolerate only allegra.  Sulfonamide Derivatives Swelling    REACTION: closed throat   Incruse Ellipta [Umeclidinium Bromide] Cough    Caused voice change and severe coughing   Clarithromycin Other (See Comments)    REACTION: diff swallowing and mouth blisters   Codeine Other (See Comments)    Unknown    Lyrica [Pregabalin] Other (See Comments)    Lack of effect for neuropathy pain.     Spiriva Handihaler [Tiotropium Bromide Monohydrate] Other (See Comments)    Voice changes   Vraylar [Cariprazine]     intolerant     Immunization History  Administered Date(s) Administered   DT (Pediatric) 12/23/2010   Fluad Quad(high Dose 65+) 12/28/2018, 12/05/2019   H1N1 05/30/2008   Influenza Split 12/23/2010, 12/16/2011, 01/08/2013   Influenza Whole 12/07/1995, 12/07/2005, 01/04/2008, 01/06/2010   Influenza, High Dose Seasonal PF 01/06/2016, 01/06/2017, 01/07/2021   Influenza,inj,Quad PF,6+ Mos 12/26/2013, 12/05/2014, 11/28/2017   PFIZER(Purple Top)SARS-COV-2 Vaccination 03/27/2019, 04/18/2019, 01/18/2020   PPD Test 10/02/2014   Pneumococcal Conjugate-13 08/17/2013   Pneumococcal Polysaccharide-23 12/06/2000, 03/08/2005, 10/24/2008, 12/28/2018   Td 03/08/1993, 10/04/2000, 12/23/2010   Tdap 10/01/2016, 04/30/2021   Zoster, Live 12/07/2005    Past Medical History:  Diagnosis Date   Allergy, unspecified not elsewhere classified    Anxiety    Cataract    Chronic diastolic congestive heart failure (HCC)    COPD (chronic obstructive pulmonary disease) ([place]) 03/08/1998   PFTs 12/12/2002 FEV 1 1.42 (64%) ratio 58 with no better after B2 and DLCO75%; PFTs 11/20/09 FEV1 1.50 (73%) ratio 50 no better after B2 with DLCO 62%; Hfa 50% 11/20/2009 >75%, 01/13/10 p coaching   Depression 12/07/1998   Diabetes mellitus type II 02/05/2006   Dr. Cruzita Lederer with endo   Disorders of bursae and tendons in shoulder region, unspecified    Rotator cuff syndrome, right   E. coli bacteremia    Esophagitis    GERD (gastroesophageal reflux disease)    History of UTI    Hyperlipidemia 10/04/2000   Hypertension 03/08/1992   Hypothyroidism 03/08/1968   Iron deficiency anemia    Lung nodule    radiation starts 10-08-2019   Malignant neoplasm of right upper lobe of lung ([place]) 07/24/2019   Obesity    NOS   OSA (obstructive sleep apnea)    PSG 01/27/10 AHI 13, pt does not know CPAP settings   Peripheral neuropathy    Likely due to DM per Dr. Murvin Natal hernia     Tobacco History: Social History   Tobacco Use  Smoking Status  Former   Packs/day: 2.00   Years: 50.00   Pack years: 100.00   Types: Cigarettes   Quit date: 02/05/2005   Years since quitting: 16.5  Smokeless Tobacco Never   Counseling given: Not Answered   Outpatient Medications Prior to Visit  Medication Sig Dispense Refill   albuterol (PROAIR HFA) 108 (90 Base) MCG/ACT inhaler Inhale 2 puffs into the lungs every 6 (six) hours as needed for wheezing or shortness of breath. INHALE 2 PUFFS INTO THE LUNGS EVERY 6 HOURS AS NEEDED FOR WHEEZING OR SHORTNESS OF BREATH 3 Inhaler 3   ALPRAZolam (XANAX) 0.25 MG tablet Take 1 tablet (0.25 mg total) by mouth 2 (two) times daily as needed for anxiety (sedation caution). 10 tablet 0   busPIRone (BUSPAR) 15 MG tablet Take 15 mg by mouth 2 (two) times daily.     Cholecalciferol (VITAMIN D) 50 MCG (2000 UT) tablet Take 1 tablet (2,000 Units total) by mouth daily.  Coenzyme Q-10 200 MG CAPS Take 200 mg by mouth daily.     Cyanocobalamin (B-12) 5000 MCG CAPS Take 5,000 mcg by mouth daily.     cyclobenzaprine (FLEXERIL) 10 MG tablet Take 1 tablet (10 mg total) by mouth at bedtime. 10 tablet 0   ezetimibe (ZETIA) 10 MG tablet TAKE 1 TABLET BY MOUTH AT BEDTIME 90 tablet 2   fexofenadine (ALLEGRA) 180 MG tablet Take 180 mg by mouth as needed.      FLUoxetine (PROZAC) 20 MG tablet Take 20 mg by mouth 2 (two) times daily.     fluticasone (FLONASE) 50 MCG/ACT nasal spray USE 2 SPRAYS INTO THE NOSE EVERY 12 HOURS AS NEEDED FOR STUFFY NOSE 48 mL 2   furosemide (LASIX) 20 MG tablet TAKE 1 TABLET (20 MG TOTAL) BY MOUTH DAILY AS NEEDED FOR FLUID. 90 tablet 1   glucose blood (ONETOUCH VERIO) test strip Use as instructed to check sugar 2-3x time daily 200 each 5   Iron, Ferrous Sulfate, 325 (65 Fe) MG TABS Take 325 mg by mouth daily. 90 tablet 3   metFORMIN (GLUCOPHAGE) 500 MG tablet TAKE 1 TABLET BY MOUTH EVERY DAY IN THE EVENING 30 tablet 1   nitroGLYCERIN (NITROSTAT) 0.4 MG SL tablet Place 1 tablet (0.4 mg total) under the  tongue every 5 (five) minutes as needed for chest pain. 25 tablet 3   ONETOUCH DELICA LANCETS FINE MISC USE TO CHECK SUGAR 1 TIME DAILY 100 each 5   ONETOUCH VERIO test strip USE AS INSTRUCTED TO CHECK SUGAR 1 TIME DAILY 100 strip 8   rosuvastatin (CRESTOR) 10 MG tablet TAKE 1 TABLET (10 MG TOTAL) BY MOUTH EVERY OTHER DAY. 45 tablet 2   Spacer/Aero Chamber Mouthpiece MISC Use with  inhaler as needed.  J44.9 1 each 0   SYNTHROID 150 MCG tablet TAKE 1 TABLET BY MOUTH DAILY BEFORE BREAKFAST. 90 tablet 2   albuterol (PROVENTIL) (2.5 MG/3ML) 0.083% nebulizer solution Take 3 mLs (2.5 mg total) by nebulization every 6 (six) hours as needed for wheezing or shortness of breath. 1080 mL 1   budesonide-formoterol (SYMBICORT) 80-4.5 MCG/ACT inhaler Inhale 2 puffs into the lungs 2 (two) times daily. 1 each 12   No facility-administered medications prior to visit.     Review of Systems:   Constitutional: No weight loss or gain, night sweats, fevers, chills +fatigue (chronic, unchanged) HEENT: No headaches, difficulty swallowing, tooth/dental problems, or sore throat. No sneezing, itching, ear ache, nasal congestion, or post nasal drip CV:  +swelling in lower extremities. No chest pain, orthopnea, PND, anasarca, dizziness, palpitations, syncope Resp: +shortness of breath with exertion (progressive); daily productive cough (unchanged); occasional wheezing. No excess mucus or change in color of mucus. No hemoptysis. No wheezing.  No chest wall deformity GI:  No heartburn, indigestion, abdominal pain, nausea, vomiting, diarrhea, change in bowel habits, loss of appetite, bloody stools.  GU: No dysuria, change in color of urine, urgency or frequency.  No flank pain, no hematuria  Skin: No rash, lesions, ulcerations MSK:  No joint pain or swelling.  No decreased range of motion.  No back pain. Neuro: No dizziness or lightheadedness.  Psych: No depression or anxiety. Mood stable.     Physical Exam:  BP  130/72 (BP Location: Left Arm, Patient Position: Sitting, Cuff Size: Normal)   Pulse 79   Temp 98.4 F (36.9 C) (Oral)   Ht 5\' 4"  (1.626 m)   Wt 218 lb 3.2 oz (99 kg)   LMP  (  LMP Unknown)   SpO2 94%   BMI 37.45 kg/m   GEN: Pleasant, interactive, chronically-ill appearing; obese; in no acute distress. HEENT:  Normocephalic and atraumatic. PERRLA. Sclera white. Nasal turbinates pink, moist and patent bilaterally. No rhinorrhea present. Oropharynx pink and moist, without exudate or edema. No lesions, ulcerations, or postnasal drip.  NECK:  Supple w/ fair ROM. No JVD present. Normal carotid impulses w/o bruits. Thyroid symmetrical with no goiter or nodules palpated. No lymphadenopathy.   CV: RRR, no m/r/g, no peripheral edema. Pulses intact, +2 bilaterally. No cyanosis, pallor or clubbing. PULMONARY:  Unlabored, regular breathing. Diminished bases bilaterally A&P w/o wheezes/rales/rhonchi. No accessory muscle use. No dullness to percussion. GI: BS present and normoactive. Soft, non-tender to palpation. No organomegaly or masses detected. No CVA tenderness. MSK: No erythema, warmth or tenderness. Cap refil <2 sec all extrem. No deformities or joint swelling noted.  Neuro: A/Ox3. No focal deficits noted.   Skin: Warm, no lesions or rashe Psych: Normal affect and behavior. Judgement and thought content appropriate.     Lab Results:  CBC    Component Value Date/Time   WBC 5.0 08/06/2021 1624   RBC 2.91 (L) 08/06/2021 1624   HGB 8.0 Repeated and verified X2. (LL) 08/06/2021 1624   HGB 10.7 (L) 05/23/2019 1141   HCT 25.9 Repeated and verified X2. (L) 08/06/2021 1624   HCT 32.9 (L) 05/23/2019 1141   PLT 218.0 08/06/2021 1624   PLT 180 05/23/2019 1141   MCV 89.1 08/06/2021 1624   MCV 95 05/23/2019 1141   MCH 26.5 07/24/2021 1252   MCHC 31.1 08/06/2021 1624   RDW 18.3 (H) 08/06/2021 1624   RDW 12.8 05/23/2019 1141   LYMPHSABS 1.3 08/06/2021 1624   MONOABS 0.4 08/06/2021 1624   EOSABS  0.1 08/06/2021 1624   BASOSABS 0.0 08/06/2021 1624    BMET    Component Value Date/Time   NA 142 08/06/2021 1620   K 4.4 08/06/2021 1620   CL 104 08/06/2021 1620   CO2 33 (H) 08/06/2021 1620   GLUCOSE 114 (H) 08/06/2021 1620   BUN 22 08/06/2021 1620   CREATININE 0.82 08/06/2021 1620   CREATININE 0.74 01/15/2021 1143   CREATININE 0.86 01/25/2020 1450   CALCIUM 9.3 08/06/2021 1620   GFRNONAA >60 07/24/2021 1252   GFRNONAA >60 01/15/2021 1143   GFRAA >60 12/03/2019 1212    BNP No results found for: BNP   Imaging:  CT CHEST W CONTRAST  Result Date: 07/17/2021 CLINICAL DATA:  Restaging non-small cell lung cancer. Status post SBRT. EXAM: CT CHEST WITH CONTRAST TECHNIQUE: Multidetector CT imaging of the chest was performed during intravenous contrast administration. RADIATION DOSE REDUCTION: This exam was performed according to the departmental dose-optimization program which includes automated exposure control, adjustment of the mA and/or kV according to patient size and/or use of iterative reconstruction technique. CONTRAST:  13mL OMNIPAQUE IOHEXOL 300 MG/ML  SOLN COMPARISON:  CT chest 01/15/2021. FINDINGS: Cardiovascular: Heart size is normal. No pericardial effusion. Aortic atherosclerosis and coronary artery calcifications. Mediastinum/Nodes: No enlarged supraclavicular, axillary, mediastinal or hilar lymph nodes. Lungs/Pleura: No pleural effusion or airspace consolidation. Within the right upper lobe there is an area of nodular architectural distortion, ground-glass attenuation and fibrosis compatible with changes due to external beam radiation. Within this area there is the a new area of nodular density which measures 1.3 x 1.1 cm, image 30/7. In the absence of interval radiation therapy recurrent tumor cannot be excluded. There is a new tiny right pleural effusion as  well as new mild pleural nodularity along the lateral right lung base, image 21/7. Scattered tiny peripheral predominant  lung nodules are noted which appear new from previous exam. For example, within the anterolateral right lower lobe new nodule measures 3 mm, image 103/7. Within the posterior left upper lobe new nodule measures 3 mm, image 62/7. Within the apical segment of the left upper lobe there is a new nodule measuring 3 mm, image 32/3. Upper Abdomen: No acute abnormality within the imaged portions of the upper abdomen. Exophytic hemangioma arising off the lateral segment of left hepatic lobe is unchanged measuring 3.8 x 2.7 cm. Bilateral low-attenuation Bosniak class 1 kidney cysts are identified. No follow-up recommended. Musculoskeletal: No acute or suspicious bone lesions. Sclerotic lesion with narrow zone of transition within the lateral aspect of the left seventh rib is stable compatible with a benign bone island. IMPRESSION: 1. There is an area of nodular architectural distortion, ground-glass attenuation and fibrosis within the right upper lobe compatible with changes due to external beam radiation. Within this area there is a new area of nodular density which measures 1.3 x 1.1 cm. In the absence of interval radiation therapy recurrent tumor cannot be excluded. Consider further evaluation with PET-CT. 2. New tiny right pleural effusion with new mild pleural nodularity along the lateral right lung base. 3. Scattered tiny peripheral predominant lung nodules are noted which appear new from previous exam. The appearance of these nodules is nonspecific but pulmonary metastasis would be the diagnosis of exclusion. 4. Aortic Atherosclerosis (ICD10-I70.0). Electronically Signed   By: Kerby Moors M.D.   On: 07/17/2021 13:54    iron sucrose (VENOFER) injection 200 mg     Date Action Dose Route User   07/24/2021 1403 Given 200 mg Intravenous Quita Skye, RN      iron sucrose (VENOFER) injection 200 mg     Date Action Dose Route User   07/29/2021 1508 Given 200 mg Intravenous Wittenbrook, Lauren H, RN       iron sucrose (VENOFER) injection 200 mg     Date Action Dose Route User   08/05/2021 1511 Given 200 mg Intravenous Easter, Tiffany S, RN      0.9 %  sodium chloride infusion     Date Action Dose Route User   07/24/2021 1421 Rate/Dose Change (none) Intravenous Quita Skye, RN   07/24/2021 1403 Rate/Dose Change (none) Intravenous Quita Skye, RN   07/24/2021 1402 New Bag/Given (none) Intravenous Levada Dy L, RN      0.9 %  sodium chloride infusion     Date Action Dose Route User   07/29/2021 1509 Rate/Dose Change (none) Intravenous Egbert Garibaldi, RN   07/29/2021 1503 New Bag/Given (none) Intravenous W[place], Tomma Rakers, RN      0.9 %  sodium chloride infusion     Date Action Dose Route User   08/05/2021 1510 Rate/Dose Change (none) Intravenous Easter, Tiffany S, RN   08/05/2021 1500 New Bag/Given (none) Intravenous Easter, Tiffany S, RN          Latest Ref Rng & Units 08/15/2014    1:36 PM  PFT Results  FVC-Pre L 1.92    FVC-Predicted Pre % 70    FVC-Post L 1.95    FVC-Predicted Post % 71    Pre FEV1/FVC % % 53    Post FEV1/FCV % % 55    FEV1-Pre L 1.01    FEV1-Predicted Pre % 49    FEV1-Post L 1.07  DLCO uncorrected ml/min/mmHg 11.56    DLCO UNC% % 47    DLVA Predicted % 70    TLC L 6.49    TLC % Predicted % 128    RV % Predicted % 188      No results found for: NITRICOXIDE      Assessment & Plan:   COPD with chronic bronchitis and emphysema (HCC) High symptom burden with progressive DOE over the last 1-2 months. Disease poorly controlled; does not seem to be having an acute exacerbation as symptoms are overall stable so will hold off on any prednisone, abx or further imaging. Recommended that we try her on dual therapy nebs (has been intolerant to LAMAs in past) - agreeable to this plan. Change PRN albuterol nebs to levalbuterol to see if this helps with her jitteriness.   Patient Instructions  Stop Symbicort.  Stop albuterol nebs.  Switch to levalbuterol (Xopenex) 3 mL every 6 hours as needed for shortness of breath or wheezing.  Continue allegra 180 mg daily as needed for allergies  Continue flonase 2 sprays each nostril daily for allergies/runny nose/nasal congestion  Start Brovana neb 2 mL Twice daily  Start budesonide neb 2 mL Twice daily. Brush tongue and rinse mouth afterwards Increase lasix to 40 mg daily for 5 days. Take in the morning Compression stockings - apply in AM and remove at night  Labs today - BMET and BNP  Follow up with radiation oncology as scheduled.   Follow up with Dr. Halford Chessman on 6/20 as scheduled. If symptoms do not improve or worsen, please contact office for sooner follow up or seek emergency care.    DOE (dyspnea on exertion) Progressive over the last 1-2 months and likely multifactorial. She has evidence of fluid overload with small pleural effusion and BLE edema - will trial lasix 40 mg for 5 days; check BMET and BNP. Follow up recommended with PCP. She also had a new nodular density within the area of previous radiation on her staging CT from May. She has seen oncology and they plan to repeat imaging in August for 3 month follow up. I am concerned that this could be recurrence of her disease and contributing to her symptoms. We will see how she does with current changes but if no improvement, will reach out to oncology team to see if we can repeat imaging sooner or obtain PET. She also has a known anemia, which likely contributes to her symptoms but her hgb is unchanged from 6 months ago so doesn't correlate to worsening over the last 1-2 months.  Malignant neoplasm of right upper lobe of lung (HCC) Status post empiric SBRT. CT chest in May with new nodular opacity in RUL. Plans for repeat in August. Follow up with oncology as scheduled.  Chronic respiratory failure with hypoxia (HCC) She did increase herself to 3 lpm due to increase SOB. She has not had any desaturations at home when she  has checked and maintains in the 90's. Advised not to titrate unless she is not maintaining >88-90%.   Chronic diastolic CHF (congestive heart failure) Worsening BLE edema. No evidence of pulmonary edema on recent CT but she did have a small right pleural effusion - could be from fluid overload vs malignant. Will trial lasix course of 40 mg for 5 days. Check BMET and BNP. If BNP elevated, will reach out to cardiology for further management. She has plans to follow up with her PCP Monday   I spent 42 minutes of  dedicated to the care of this patient on the date of this encounter to include pre-visit review of records, face-to-face time with the patient discussing conditions above, post visit ordering of testing, clinical documentation with the electronic health record, making appropriate referrals as documented, and communicating necessary findings to members of the patients care team.  Clayton Bibles, NP 08/06/2021  Pt aware and understands NP's role.

## 2021-08-07 NOTE — Telephone Encounter (Signed)
Called and spoke with patient about results and advised to continue as is with everything. Patient verbalized understanding and has f/u appt with Dr. [name] on 08/10/21 at 4:00 pm.

## 2021-08-07 NOTE — Progress Notes (Signed)
Please notify patient that her BMET showed stable kidney function and potassium level was good so she can increase her lasix as we discussed yesterday. Thanks.

## 2021-08-09 ENCOUNTER — Encounter: Payer: Self-pay | Admitting: Family Medicine

## 2021-08-10 ENCOUNTER — Ambulatory Visit (INDEPENDENT_AMBULATORY_CARE_PROVIDER_SITE_OTHER): Payer: Medicare Other | Admitting: Family Medicine

## 2021-08-10 ENCOUNTER — Encounter: Payer: Self-pay | Admitting: Family Medicine

## 2021-08-10 VITALS — BP 122/78 | HR 52 | Temp 98.0°F | Ht 64.0 in | Wt 216.0 lb

## 2021-08-10 DIAGNOSIS — D509 Iron deficiency anemia, unspecified: Secondary | ICD-10-CM | POA: Diagnosis not present

## 2021-08-10 DIAGNOSIS — L989 Disorder of the skin and subcutaneous tissue, unspecified: Secondary | ICD-10-CM | POA: Diagnosis not present

## 2021-08-10 DIAGNOSIS — I5032 Chronic diastolic (congestive) heart failure: Secondary | ICD-10-CM | POA: Diagnosis not present

## 2021-08-10 DIAGNOSIS — E039 Hypothyroidism, unspecified: Secondary | ICD-10-CM

## 2021-08-10 DIAGNOSIS — I25118 Atherosclerotic heart disease of native coronary artery with other forms of angina pectoris: Secondary | ICD-10-CM | POA: Diagnosis not present

## 2021-08-10 MED ORDER — LEVOTHYROXINE SODIUM 125 MCG PO TABS: 125.0000 ug | ORAL_TABLET | Freq: Every day | ORAL | Status: DC

## 2021-08-10 MED ORDER — FUROSEMIDE 20 MG PO TABS: 20.0000 mg | ORAL_TABLET | Freq: Every day | ORAL | 1 refills | Status: DC | PRN

## 2021-08-10 MED ORDER — LEVOTHYROXINE SODIUM 125 MCG PO TABS: 125.0000 ug | ORAL_TABLET | Freq: Every day | ORAL | 1 refills | Status: DC

## 2021-08-10 NOTE — Progress Notes (Signed)
Reviewed and agree with assessment/plan.   Daquarius Dubeau, MD [facility] 08/10/2021, 1:48 PM Pager:  [phone]  

## 2021-08-10 NOTE — Patient Instructions (Addendum)
I would recheck TSH in about 1 month. Keep taking 40mg  lasix if your AM weight is 215 lbs or above.   If below 215, then take 20mg .   Update me in about 1 week.   Keep using compression stockings.   The spot should gradually heal over.  Keep it clean and covered.  We'll go from there.

## 2021-08-10 NOTE — Progress Notes (Unsigned)
TSH was prev low, changed to 139mcg 10 days ago.  TSH wnl now.   She is on PO iron and iron infusion.  Labs d/w pt.  She is still following up with GI and hematology.    BNP still slightly elevated.  BLE edema.  Has been taking 20mg  most days, with 40mg  recently.  Ankle swelling was better with 40mg  dosing.  Usually AM weight is 217 or 218 lbs.    1x1 cm R anterior shin.  Superficial, with granulation tissue.    Need to update cardiology- Dr. Rockey Situ.    2+ BLE edema.

## 2021-08-12 ENCOUNTER — Inpatient Hospital Stay: Payer: Medicare Other | Attending: Internal Medicine

## 2021-08-12 VITALS — BP 105/34 | HR 71 | Temp 98.1°F | Resp 18

## 2021-08-12 DIAGNOSIS — Z87891 Personal history of nicotine dependence: Secondary | ICD-10-CM | POA: Insufficient documentation

## 2021-08-12 DIAGNOSIS — D649 Anemia, unspecified: Secondary | ICD-10-CM

## 2021-08-12 DIAGNOSIS — Z79899 Other long term (current) drug therapy: Secondary | ICD-10-CM | POA: Insufficient documentation

## 2021-08-12 DIAGNOSIS — D509 Iron deficiency anemia, unspecified: Secondary | ICD-10-CM | POA: Insufficient documentation

## 2021-08-12 DIAGNOSIS — Z85118 Personal history of other malignant neoplasm of bronchus and lung: Secondary | ICD-10-CM | POA: Diagnosis not present

## 2021-08-12 DIAGNOSIS — L989 Disorder of the skin and subcutaneous tissue, unspecified: Secondary | ICD-10-CM | POA: Insufficient documentation

## 2021-08-12 MED ORDER — SODIUM CHLORIDE 0.9 % IV SOLN: 200.0000 mg | Freq: Once | INTRAVENOUS | Status: DC

## 2021-08-12 MED ORDER — IRON SUCROSE 20 MG/ML IV SOLN
200.0000 mg | Freq: Once | INTRAVENOUS | Status: AC
  Administered 2021-08-12: 200 mg via INTRAVENOUS
  Filled 2021-08-12: qty 10

## 2021-08-12 MED ORDER — SODIUM CHLORIDE 0.9 % IV SOLN
Freq: Once | INTRAVENOUS | Status: AC
  Filled 2021-08-12: qty 250

## 2021-08-12 NOTE — Assessment & Plan Note (Signed)
Previously per GI and hematology, discussed.  Continue p.o. iron and iron infusion per outside clinic.

## 2021-08-12 NOTE — Progress Notes (Signed)
Pt completed venofer without incident. As patient was getting in the wheelchair to go home, she states she is feeling "lightheaded, like im going to fall over" and that she has been all day but failed to mention it to [place] until now. BP 105/34. Pt recently increased lasix to 40mg . Pt encouraged to monitor bp at home and follow up with PCP for further symptoms./concerns. Dr. Rogue Bussing aware. Per MD ok to d/c home.

## 2021-08-12 NOTE — Assessment & Plan Note (Signed)
This should gradually heal over.  Advised to keep it clean and covered.

## 2021-08-12 NOTE — Assessment & Plan Note (Signed)
I am writing this note to Dr. Rockey Situ as Juluis Rainier.   We will set target weight of 215 pounds, at least for now.  She is aware we may have to adjust her target weight. Advised to keep taking 40mg  lasix if AM weight is 215 lbs or above.   If below 215, then take 20mg .   I asked her to update me in about 1 week.   Advised to keep using compression stockings.

## 2021-08-12 NOTE — Patient Instructions (Signed)

## 2021-08-12 NOTE — Assessment & Plan Note (Signed)
Continue levothyroxine 125 mcg. I would recheck TSH in about 1 month.

## 2021-08-17 ENCOUNTER — Encounter: Payer: Self-pay | Admitting: Physician Assistant

## 2021-08-17 ENCOUNTER — Ambulatory Visit (INDEPENDENT_AMBULATORY_CARE_PROVIDER_SITE_OTHER): Payer: Medicare Other | Admitting: Physician Assistant

## 2021-08-17 VITALS — BP 110/60 | HR 57 | Resp 14 | Ht 65.0 in | Wt 217.6 lb

## 2021-08-17 DIAGNOSIS — Z9981 Dependence on supplemental oxygen: Secondary | ICD-10-CM | POA: Diagnosis not present

## 2021-08-17 DIAGNOSIS — D509 Iron deficiency anemia, unspecified: Secondary | ICD-10-CM | POA: Diagnosis not present

## 2021-08-17 DIAGNOSIS — I25118 Atherosclerotic heart disease of native coronary artery with other forms of angina pectoris: Secondary | ICD-10-CM

## 2021-08-17 DIAGNOSIS — R1013 Epigastric pain: Secondary | ICD-10-CM | POA: Diagnosis not present

## 2021-08-17 DIAGNOSIS — G8929 Other chronic pain: Secondary | ICD-10-CM | POA: Diagnosis not present

## 2021-08-17 MED ORDER — ESOMEPRAZOLE MAGNESIUM 40 MG PO CPDR: 40.0000 mg | DELAYED_RELEASE_CAPSULE | Freq: Every day | ORAL | 11 refills | Status: DC

## 2021-08-17 NOTE — Patient Instructions (Signed)
We have sent the following medications to your pharmacy for you to pick up at your convenience: Nexium 40 mg daily 30-60 minutes before breakfast.  If you are age 84 or older, your body mass index should be between 23-30. Your Body mass index is 36.21 kg/m. If this is out of the aforementioned range listed, please consider follow up with your Primary Care Provider.  If you are age 2 or younger, your body mass index should be between 19-25. Your Body mass index is 36.21 kg/m. If this is out of the aformentioned range listed, please consider follow up with your Primary Care Provider.   ________________________________________________________  The [facility] providers would like to encourage you to use [facility] to communicate with providers for non-urgent requests or questions.  Due to long hold times on the telephone, sending your provider a message by [facility] may be a faster and more efficient way to get a response.  Please allow 48 business hours for a response.  Please remember that this is for non-urgent requests.  _______________________________________________________

## 2021-08-17 NOTE — Progress Notes (Signed)
Chief Complaint: Iron deficiency anemia  HPI:    Alexandria [name] is an 86 year old female with a past medical history of CHF, COPD on 24-hour oxygen use, morbid obesity, diabetes, GERD, iron deficiency anemia and multiple others listed below, known to Dr. Ardis Hughs, who was referred to me by Tonia Ghent, MD for a complaint of iron deficiency anemia.      02/12/2020 EGD and colonoscopy for iron deficiency anemia.  EGD with a small hiatal hernia and otherwise normal.  Colonoscopy with sigmoid diverticulosis and the lipoma in the sigmoid colon and internal hemorrhoids.    08/30/2019 patient seen in clinic by Carl Best for iron deficiency anemia.  At that time discussed she had previously been told to continue p.o. iron once daily indefinitely due to her multiple comorbidities.  At time of that visit patient recently diagnosed with stage I lung cancer.  She had iron deficiency anemia and positive FOBT.  Her reflux is well controlled on Nexium 40 mg once daily.  She continued on Ferrous sulfate 325 mg p.o. daily.  At that time repeated CBC.  Decided against invasive endoscopic/small bowel pill capsule endoscopy unless she demonstrated significant active GI bleed or worsening anemia.    03/18/2021 CT of the abdomen pelvis with contrast showed mild wall thickening of the proximal duodenum at least in part related to underdistention and may reflect peptic ulcer disease or duodenitis.    07/24/2021 office visit with hematology/oncology for chronic intermittent anemia.  Noted a CT scan in January 2023 which showed duodenal thickening recommended GI eval.    08/06/2021 CBC with a hemoglobin of 8.0 (7.1 on 07/24/2021).  Iron normal at 69.    Today, the patient is seen in clinic and tells me that as far as her GI symptoms she has occasional epigastric pain which she tells me is from a hiatal hernia.  Apparently this was repaired 10 or so years ago but occasionally flares up and gives her pain "or at least that is  what I blame it on".  She is currently on Nexium 20 mg every other day and takes Pepto-Bismol when needed for breakthrough reflux symptoms when she eats something that bothers her.  Tells me that they wanted her to have full GI work-up for iron deficiency anemia.  Most recently she has a fractured rib and is bothered by pain from this.  Apparently aggravated it recently.    Her husband recently passed away and her daughter has taken a more primary role in her health care.  She was called during time of our visit and I also discussed everything with her and answered questions.    Denies fever, chills, seeing blood in her stool, nausea, vomiting or change in bowel habits.  Past Medical History:  Diagnosis Date   Allergy, unspecified not elsewhere classified    Anxiety    Cataract    Chronic diastolic congestive heart failure (HCC)    COPD (chronic obstructive pulmonary disease) ([place]) 03/08/1998   PFTs 12/12/2002 FEV 1 1.42 (64%) ratio 58 with no better after B2 and DLCO75%; PFTs 11/20/09 FEV1 1.50 (73%) ratio 50 no better after B2 with DLCO 62%; Hfa 50% 11/20/2009 >75%, 01/13/10 p coaching   Depression 12/07/1998   Diabetes mellitus type II 02/05/2006   Dr. Cruzita Lederer with endo   Disorders of bursae and tendons in shoulder region, unspecified    Rotator cuff syndrome, right   E. coli bacteremia    Esophagitis    GERD (gastroesophageal reflux  disease)    History of UTI    Hyperlipidemia 10/04/2000   Hypertension 03/08/1992   Hypothyroidism 03/08/1968   Iron deficiency anemia    Lung nodule    radiation starts 10-08-2019   Malignant neoplasm of right upper lobe of lung ([place]) 07/24/2019   Obesity    NOS   OSA (obstructive sleep apnea)    PSG 01/27/10 AHI 13, pt does not know CPAP settings   Peripheral neuropathy    Likely due to DM per Dr. Murvin Natal hernia     Past Surgical History:  Procedure Laterality Date   ABD U/S  03/19/1999   Nml x2 foci in liver   ADENOSINE MYOVIEW  06/02/2007    Nml   CARDIOLITE PERSANTINE  08/24/2000   Nml   CAROTID U/S  08/24/2000   1-39% ICA stenosis   CAROTID U/S  06/02/2007   No apprec change    CARPAL TUNNEL RELEASE  12/1997   Right   CESAREAN SECTION     x2 [place]/ repeat   CHOLECYSTECTOMY  1997   COLONOSCOPY WITH PROPOFOL N/A 02/12/2016   Procedure: COLONOSCOPY WITH PROPOFOL;  Surgeon: Milus Banister, MD;  Location: [facility];  Service: Endoscopy;  Laterality: N/A;   CT ABD W & PELVIS WO/W CM     Abd hemangiomas of liver, 1 cm R renal cyst   DENTAL SURGERY  2016   Implants   DEXA  07/03/2003   Nml   ESOPHAGOGASTRODUODENOSCOPY  12/05/1997   Nml (due to hoarseness)   ESOPHAGOGASTRODUODENOSCOPY (EGD) WITH PROPOFOL N/A 02/12/2016   Procedure: ESOPHAGOGASTRODUODENOSCOPY (EGD) WITH PROPOFOL;  Surgeon: Milus Banister, MD;  Location: [facility];  Service: Endoscopy;  Laterality: N/A;   GALLBLADDER SURGERY     HERNIA REPAIR  01/24/2009   Lap Ventr w/ Lysis of adhesions (Dr. Donne Hazel)   knee arthroscopic surgery  years ago   right   ROTATOR CUFF REPAIR  1984   Right, [place]   SHOULDER OPEN ROTATOR CUFF REPAIR  02/08/2012   Procedure: ROTATOR CUFF REPAIR SHOULDER OPEN;  Surgeon: Magnus Sinning, MD;  Location: [facility];  Service: Orthopedics;  Laterality: Left;  Left Shoulder Open Anterior Acrominectomy Rotator Cuff Repair Open Distal Clavicle Resection ,tissue mend graft, and repair of biceps tendon   THUMB RELEASE  12/1997   Right   TONSILLECTOMY     TOTAL ABDOMINAL HYSTERECTOMY  1985   Due to dysmennorhea   US ECHOCARDIOGRAPHY  06/02/2007    Current Outpatient Medications  Medication Sig Dispense Refill   albuterol (PROAIR HFA) 108 (90 Base) MCG/ACT inhaler Inhale 2 puffs into the lungs every 6 (six) hours as needed for wheezing or shortness of breath. INHALE 2 PUFFS INTO THE LUNGS EVERY 6 HOURS AS NEEDED FOR WHEEZING OR SHORTNESS OF BREATH 3 Inhaler 3   arformoterol (BROVANA) 15 MCG/2ML NEBU Take 2 mLs (15 mcg total)  by nebulization 2 (two) times daily. 360 mL 3   budesonide (PULMICORT) 0.5 MG/2ML nebulizer solution Take 2 mLs (0.5 mg total) by nebulization in the morning and at bedtime. 360 mL 3   busPIRone (BUSPAR) 15 MG tablet Take 15 mg by mouth 2 (two) times daily.     Cholecalciferol (VITAMIN D) 50 MCG (2000 UT) tablet Take 1 tablet (2,000 Units total) by mouth daily.     Coenzyme Q-10 200 MG CAPS Take 200 mg by mouth daily.     Cyanocobalamin (B-12) 5000 MCG CAPS Take 5,000 mcg by mouth daily.  cyclobenzaprine (FLEXERIL) 10 MG tablet Take 1 tablet (10 mg total) by mouth at bedtime. 10 tablet 0   ezetimibe (ZETIA) 10 MG tablet TAKE 1 TABLET BY MOUTH AT BEDTIME 90 tablet 2   fexofenadine (ALLEGRA) 180 MG tablet Take 180 mg by mouth as needed.      FLUoxetine (PROZAC) 20 MG tablet Take 20 mg by mouth 2 (two) times daily.     fluticasone (FLONASE) 50 MCG/ACT nasal spray USE 2 SPRAYS INTO THE NOSE EVERY 12 HOURS AS NEEDED FOR STUFFY NOSE 48 mL 2   furosemide (LASIX) 20 MG tablet Take 1-2 tablets (20-40 mg total) by mouth daily as needed for fluid. 90 tablet 1   glucose blood (ONETOUCH VERIO) test strip Use as instructed to check sugar 2-3x time daily 200 each 5   Iron, Ferrous Sulfate, 325 (65 Fe) MG TABS Take 325 mg by mouth daily. 90 tablet 3   levothyroxine (SYNTHROID) 125 MCG tablet Take 1 tablet (125 mcg total) by mouth daily. 90 tablet 1   metFORMIN (GLUCOPHAGE) 500 MG tablet TAKE 1 TABLET BY MOUTH EVERY DAY IN THE EVENING 30 tablet 1   nitroGLYCERIN (NITROSTAT) 0.4 MG SL tablet Place 1 tablet (0.4 mg total) under the tongue every 5 (five) minutes as needed for chest pain. 25 tablet 3   ONETOUCH DELICA LANCETS FINE MISC USE TO CHECK SUGAR 1 TIME DAILY 100 each 5   ONETOUCH VERIO test strip USE AS INSTRUCTED TO CHECK SUGAR 1 TIME DAILY 100 strip 8   rosuvastatin (CRESTOR) 10 MG tablet TAKE 1 TABLET (10 MG TOTAL) BY MOUTH EVERY OTHER DAY. 45 tablet 2   Spacer/Aero Chamber Mouthpiece MISC Use with   inhaler as needed.  J44.9 1 each 0   No current facility-administered medications for this visit.    Allergies as of 08/17/2021 - Review Complete 08/10/2021  Allergen Reaction Noted   Antihistamines, diphenhydramine-type Other (See Comments) 02/09/2016   Sulfonamide derivatives Swelling 07/12/2006   Incruse ellipta [umeclidinium bromide] Cough 01/21/2016   Clarithromycin Other (See Comments) 05/23/2007   Codeine Other (See Comments) 07/12/2006   Lyrica [pregabalin] Other (See Comments) 10/19/2016   Spiriva handihaler [tiotropium bromide monohydrate] Other (See Comments) 06/13/2014   Vraylar [cariprazine]  02/06/2021    Family History  Problem Relation Age of Onset   Heart disease Mother    Thyroid disease Mother    Emphysema Father        One lung   Cystic fibrosis Sister    Hyperthyroidism Sister    Osteoarthritis Brother    Hyperthyroidism Brother    Esophageal cancer Neg Hx    Cancer Neg Hx        Head or neck   Colon cancer Neg Hx    Stomach cancer Neg Hx    Breast cancer Neg Hx     Social History   Socioeconomic History   Marital status: Widowed    Spouse name: Not on file   Number of children: 2   Years of education: Not on file   Highest education level: Not on file  Occupational History   Occupation: Retired    Fish farm manager: RETIRED  Tobacco Use   Smoking status: Former    Packs/day: 2.00    Years: 50.00    Total pack years: 100.00    Types: Cigarettes    Quit date: 02/05/2005    Years since quitting: 16.5   Smokeless tobacco: Never  Vaping Use   Vaping Use: Never used  Substance and  Sexual Activity   Alcohol use: Yes    Comment: occasionally   Drug use: Never   Sexual activity: Not Currently  Other Topics Concern   Not on file  Social History Narrative   Widowed 2023, 2 children; Enjoys painting      Quit smoking in 2007; no alcohol; lives in [place]; [name]/d lives in [place]. Had own business. Dont drive- sec to neuropathy.    Social  Determinants of Health   Financial Resource Strain: Not on file  Food Insecurity: Not on file  Transportation Needs: Not on file  Physical Activity: Not on file  Stress: Not on file  Social Connections: Not on file  Intimate Partner Violence: Not on file    Review of Systems:    Constitutional: No weight loss, fever or chills Cardiovascular: No chest pain Respiratory: No SOB  Gastrointestinal: See HPI and otherwise negative   Physical Exam:  Vital signs: BP 110/60 (BP Location: Left Arm, Patient Position: Sitting)   Pulse (!) 57   Resp 14   Ht 5\' 5"  (1.651 m)   Wt 217 lb 9.6 oz (98.7 kg)   LMP  (LMP Unknown)   SpO2 97%   BMI 36.21 kg/m    Constitutional:   Pleasant elderly, chronically ill appearing Caucasian female appears to be in NAD, Well developed, Well nourished, alert and cooperative Respiratory: Respirations even and unlabored. Lungs clear to auscultation bilaterally.   No wheezes, crackles, or rhonchi. On O2 via [place] Cardiovascular: Normal S1, S2. No MRG. Regular rate and rhythm. No peripheral edema, cyanosis or pallor.  Gastrointestinal:  Soft, nondistended, nontender. No rebound or guarding. Normal bowel sounds. No appreciable masses or hepatomegaly. Rectal:  Not performed.  Msk:  Symmetrical without gross deformities. Without edema, no deformity or joint abnormality. +ambulates in wheelchair Psychiatric: Oriented to person, place and time. Demonstrates good judgement and reason without abnormal affect or behaviors.  RELEVANT LABS AND IMAGING: CBC    Component Value Date/Time   WBC 5.0 08/06/2021 1624   RBC 2.91 (L) 08/06/2021 1624   HGB 8.0 Repeated and verified X2. (LL) 08/06/2021 1624   HGB 10.7 (L) 05/23/2019 1141   HCT 25.9 Repeated and verified X2. (L) 08/06/2021 1624   HCT 32.9 (L) 05/23/2019 1141   PLT 218.0 08/06/2021 1624   PLT 180 05/23/2019 1141   MCV 89.1 08/06/2021 1624   MCV 95 05/23/2019 1141   MCH 26.5 07/24/2021 1252   MCHC 31.1  08/06/2021 1624   RDW 18.3 (H) 08/06/2021 1624   RDW 12.8 05/23/2019 1141   LYMPHSABS 1.3 08/06/2021 1624   MONOABS 0.4 08/06/2021 1624   EOSABS 0.1 08/06/2021 1624   BASOSABS 0.0 08/06/2021 1624    CMP     Component Value Date/Time   NA 142 08/06/2021 1620   K 4.4 08/06/2021 1620   CL 104 08/06/2021 1620   CO2 33 (H) 08/06/2021 1620   GLUCOSE 114 (H) 08/06/2021 1620   BUN 22 08/06/2021 1620   CREATININE 0.82 08/06/2021 1620   CREATININE 0.74 01/15/2021 1143   CREATININE 0.86 01/25/2020 1450   CALCIUM 9.3 08/06/2021 1620   PROT 6.9 06/08/2021 1547   ALBUMIN 3.9 06/08/2021 1547   AST 13 06/08/2021 1547   ALT 7 06/08/2021 1547   ALKPHOS 67 06/08/2021 1547   BILITOT 0.4 06/08/2021 1547   GFRNONAA >60 07/24/2021 1252   GFRNONAA >60 01/15/2021 1143   GFRAA >60 12/03/2019 1212    Assessment: 1.  Iron deficiency anemia: Seen previously 2  years ago for the same symptoms, at that time had recent EGD and colonoscopy with no findings to identify etiology of her IDA, pill capsule endoscopy was deferred due to her chronic medical conditions and high risk, she has continued to be anemic over the past couple of years and follows with hematology/oncology in regards to this as well as lung cancer, not seeing any blood in her stools 2.  GERD/epigastric pain: Occasional reflux symptoms and epigastric pain, describes hiatal hernia repair 10+ years ago, currently on Nexium 20 mg every other day, recent CT with question of duodenitis versus underdistention; consider ongoing gastritis/duodenitis 3.  COPD requiring 24 hour O2 supplementation  Plan: 1.  Discussed further work-up with the patient and her daughter.  The next step would be a pill capsule endoscopy, though I am not sure we would find anything that we would be able to fix and it may be higher risk for her given that if there was a complication she would not be a good candidate for surgery.  This was discussed thoroughly with the patient and  her daughter.  The daughter requested Dr. Ardis Hughs opinion personally and wants him to review things.  Explained that I would also discuss case with Dr. Ardis Hughs and see what he thought now or if his opinion had changed. 2.  For now we will optimize medical therapy and increase Nexium to 40 mg once daily.  Prescribed #30 with 5 refills. 3.  Patient to continue following with hematology/oncology with iron infusions as needed 4.  Patient to follow in clinic per recommendations after discussion above.  Ellouise Newer, PA-C [facility] 08/17/2021, 2:21 PM  Cc: Tonia Ghent, MD

## 2021-08-20 DIAGNOSIS — R32 Unspecified urinary incontinence: Secondary | ICD-10-CM | POA: Diagnosis not present

## 2021-08-20 DIAGNOSIS — D509 Iron deficiency anemia, unspecified: Secondary | ICD-10-CM | POA: Diagnosis not present

## 2021-08-20 DIAGNOSIS — K219 Gastro-esophageal reflux disease without esophagitis: Secondary | ICD-10-CM | POA: Diagnosis not present

## 2021-08-20 DIAGNOSIS — Z90710 Acquired absence of both cervix and uterus: Secondary | ICD-10-CM | POA: Diagnosis not present

## 2021-08-20 DIAGNOSIS — Z87440 Personal history of urinary (tract) infections: Secondary | ICD-10-CM | POA: Diagnosis not present

## 2021-08-20 DIAGNOSIS — Z9181 History of falling: Secondary | ICD-10-CM | POA: Diagnosis not present

## 2021-08-20 DIAGNOSIS — Z87891 Personal history of nicotine dependence: Secondary | ICD-10-CM | POA: Diagnosis not present

## 2021-08-20 DIAGNOSIS — Z85118 Personal history of other malignant neoplasm of bronchus and lung: Secondary | ICD-10-CM | POA: Diagnosis not present

## 2021-08-20 DIAGNOSIS — G4733 Obstructive sleep apnea (adult) (pediatric): Secondary | ICD-10-CM | POA: Diagnosis not present

## 2021-08-20 DIAGNOSIS — Z7984 Long term (current) use of oral hypoglycemic drugs: Secondary | ICD-10-CM | POA: Diagnosis not present

## 2021-08-20 DIAGNOSIS — F411 Generalized anxiety disorder: Secondary | ICD-10-CM | POA: Diagnosis not present

## 2021-08-20 DIAGNOSIS — Z9049 Acquired absence of other specified parts of digestive tract: Secondary | ICD-10-CM | POA: Diagnosis not present

## 2021-08-20 DIAGNOSIS — Z79899 Other long term (current) drug therapy: Secondary | ICD-10-CM | POA: Diagnosis not present

## 2021-08-20 DIAGNOSIS — E782 Mixed hyperlipidemia: Secondary | ICD-10-CM | POA: Diagnosis not present

## 2021-08-20 DIAGNOSIS — E669 Obesity, unspecified: Secondary | ICD-10-CM | POA: Diagnosis not present

## 2021-08-20 DIAGNOSIS — Z7951 Long term (current) use of inhaled steroids: Secondary | ICD-10-CM | POA: Diagnosis not present

## 2021-08-20 DIAGNOSIS — J449 Chronic obstructive pulmonary disease, unspecified: Secondary | ICD-10-CM | POA: Diagnosis not present

## 2021-08-20 DIAGNOSIS — E038 Other specified hypothyroidism: Secondary | ICD-10-CM | POA: Diagnosis not present

## 2021-08-20 DIAGNOSIS — I5042 Chronic combined systolic (congestive) and diastolic (congestive) heart failure: Secondary | ICD-10-CM | POA: Diagnosis not present

## 2021-08-20 DIAGNOSIS — Z6835 Body mass index (BMI) 35.0-35.9, adult: Secondary | ICD-10-CM | POA: Diagnosis not present

## 2021-08-20 DIAGNOSIS — I110 Hypertensive heart disease with heart failure: Secondary | ICD-10-CM | POA: Diagnosis not present

## 2021-08-20 DIAGNOSIS — M7550 Bursitis of unspecified shoulder: Secondary | ICD-10-CM | POA: Diagnosis not present

## 2021-08-20 DIAGNOSIS — E1136 Type 2 diabetes mellitus with diabetic cataract: Secondary | ICD-10-CM | POA: Diagnosis not present

## 2021-08-20 DIAGNOSIS — E1142 Type 2 diabetes mellitus with diabetic polyneuropathy: Secondary | ICD-10-CM | POA: Diagnosis not present

## 2021-08-20 DIAGNOSIS — F32A Depression, unspecified: Secondary | ICD-10-CM | POA: Diagnosis not present

## 2021-08-24 NOTE — Progress Notes (Signed)
I agree with the above note, plan.  Iron replacement with IV iron +/- oral iron is probably the easiest, safest, most complete solution here.

## 2021-08-25 ENCOUNTER — Encounter: Payer: Self-pay | Admitting: Pulmonary Disease

## 2021-08-25 ENCOUNTER — Telehealth: Payer: Self-pay

## 2021-08-25 ENCOUNTER — Ambulatory Visit (INDEPENDENT_AMBULATORY_CARE_PROVIDER_SITE_OTHER): Payer: Medicare Other | Admitting: Pulmonary Disease

## 2021-08-25 VITALS — BP 124/62 | HR 59 | Temp 98.4°F | Ht 65.0 in | Wt 219.4 lb

## 2021-08-25 DIAGNOSIS — J449 Chronic obstructive pulmonary disease, unspecified: Secondary | ICD-10-CM | POA: Diagnosis not present

## 2021-08-25 DIAGNOSIS — J9611 Chronic respiratory failure with hypoxia: Secondary | ICD-10-CM

## 2021-08-25 DIAGNOSIS — I25118 Atherosclerotic heart disease of native coronary artery with other forms of angina pectoris: Secondary | ICD-10-CM | POA: Diagnosis not present

## 2021-08-25 DIAGNOSIS — J439 Emphysema, unspecified: Secondary | ICD-10-CM

## 2021-08-25 MED ORDER — GUAIFENESIN ER 600 MG PO TB12: 1200.0000 mg | ORAL_TABLET | Freq: Two times a day (BID) | ORAL | Status: DC | PRN

## 2021-08-25 NOTE — Telephone Encounter (Signed)
-----   Message from Levin Erp, [place] sent at 08/25/2021  8:34 AM EDT ----- Regarding: iron Can you make sure patient/daughter hear recs from dr Ardis Hughs- iv iron replacement/ referral to hematology.  Thanks-JLL ----- Message ----- From: Milus Banister, MD Sent: 08/24/2021   7:52 AM EDT To: Levin Erp, PA     ----- Message ----- From: Levin Erp, [place] Sent: 08/17/2021   3:19 PM EDT To: Milus Banister, MD

## 2021-08-25 NOTE — Patient Instructions (Signed)
Try using mucinex to help with cough and clear chest congestion  Follow up in 3 months

## 2021-08-25 NOTE — Progress Notes (Signed)
[name]  Chief Complaint  Patient presents with   Follow-up    Pt is here for follow up for chronic resp failure. Pt states she is having issues with her knees with pain. At heart doc and had pain in shoulders and was noted on xray she had fractured rib. Pt states it has not gotten better pain wise. Pt was to talk with VS about this.     Constitutional:  BP 124/62 (BP Location: Left Arm, Patient Position: Sitting, Cuff Size: Normal)   Pulse (!) 59   Temp 98.4 F (36.9 C) (Oral)   Ht 5\' 5"  (1.651 m)   Wt 219 lb 6.4 oz (99.5 kg)   LMP  (LMP Unknown)   SpO2 98%   BMI 36.51 kg/m   Past Medical History:  Depression, Hypothyroidism, HTN, Diastolic CHF, HLD, DM, GERD, Peripheral neuropathy  Past Surgical History:  She  has a past surgical history that includes Tonsillectomy; Rotator cuff repair (1984); Cesarean section; Total abdominal hysterectomy (1985); Cholecystectomy (1997); THUMB RELEASE (12/1997); Carpal tunnel release (12/1997); Esophagogastroduodenoscopy (12/05/1997); ABD U/S (03/19/1999); CT ABD W & PELVIS WO/W CM; CARDIOLITE PERSANTINE (08/24/2000); CAROTID U/S (08/24/2000); DEXA (07/03/2003); ADENOSINE MYOVIEW (06/02/2007); CAROTID U/S (06/02/2007); US ECHOCARDIOGRAPHY (06/02/2007); Hernia repair (01/24/2009); knee arthroscopic surgery (years ago); Shoulder open rotator cuff repair (02/08/2012); Gallbladder surgery; Dental surgery (2016); Colonoscopy with propofol (N/A, 02/12/2016); and Esophagogastroduodenoscopy (egd) with propofol (N/A, 02/12/2016).  Brief Summary:  Alexandria [name] is a 86 y.o. female female with dyspnea from COPD, diastolic CHF, deconditioning, and iron deficiency anemia.  She also has hx of OSA, but intolerant of CPAP.      Subjective:   She was having more trouble with her breathing earlier this month.  Saw Katy Cobb.  Had script sent for brovana and pulmicort.  She just received these, but hasn't started yet.   She has been getting iron infusions for anemia.  Her breathing does better when her Hb level is higher.  She has cough with clear sputum.  She had rib fracture in April.  She still has chest discomfort when she takes a deep breath or moves in certain positions.  This only lasts a few seconds.  Physical Exam:   Appearance - well kempt, wearing oxygen  ENMT - no sinus tenderness, no oral exudate, no LAN, Mallampati 3 airway, no stridor  Respiratory - scattered rhonchi that clear with cough Lt > Rt  CV - s1s2 regular rate and rhythm, no murmurs  Ext - no clubbing, no edema  Skin - no rashes  Psych - normal mood and affect     Pulmonary testing:  RAST 10/02/10 >> negative, IgE 8.1 A1AT 04/15/11 >> MM PFT 10/512 >> FEV1 1.29 (61%), FEV1% 57, TLC 6.01 (119%), RV 3.52 (153%), DLCO 67%, no BD PFT 08/15/14 >> FEV1 1.07 (57%), FEV1% 55, TLC 6.49 (128%), DLCO 47%, no BD  Chest Imaging:  CT chest 06/29/16 >> atherosclerosis, RUL nodule 7 mm (was 8 mm), no change 6 mm LUL nodule, no change 4 mm RLL nodule  CT chest 12/23/17 >> 10 mm RUL nodule, 7 mm RUL nodule, 4 mm RLL nodule, 6 mm LUL nodule, centrilobular emphysema PET scan 01/06/18 >> RUL nodule not hypermetabolic CT chest 8/52/77 >> atherosclerosis, moderate centrilobular emphysema with diffuse bronchial wall thickening, 2.4 cm posterior apical irregular nodule increased from 2.3 cm, scar medial RLL, scattered nodules up to 4 mm in medial RLL, 6 mm nodule in LLL CT  chest 12/14/18 >> no change RUL nodule CT chest 06/13/19 >> 2.2 x 1.2 cm RUL nodule (was 2.1 x 1.0 cm from October 2020), 0.4 cm nodule LLL CT chest 01/16/21 >> post XRT changes to RUL CT chest 07/17/21 >> XRT changes in RUL, new 1.3 x 1.1 cm nodular density in RUL, scattered smaller nodules  Sleep Tests:  PSG 01/27/10 >> AHI 13, SpO2 low 73% ONO with CPAP 08/09/14 >> Test time 9 hr 16 min.  Mean SpO2 92.2%, low SpO2 86%. Spent 10 min with SpO2 < 88% ONO with CPAP 06/24/16 >> test  time 7 hrs 29 min.  Average SpO2 90%, low SpO2 79%.  Spent 58 min with SpO2 < 88%. ONO with RA 11/01/16 >> test time 6 hrs 43 min.  Average SpO2 89%, low SpO2 67%.  Spent 2 hrs 7 min with SpO2 < 88%. ONO with 2 liters 07/18/18 >> test time 8 hrs 34 min.  Baseline SpO2 94%, low SpO2 69%.  Spent 38 min with SpO2 < 88%.  Cardiac Tests:  Echo 02/11/21 >> EF 55 to 60%, grade 2 DD, mild/mod LA dilation  Social History:  She  reports that she quit smoking about 16 years ago. Her smoking use included cigarettes. She has a 100.00 pack-year smoking history. She has never used smokeless tobacco. She reports current alcohol use. She reports that she does not use drugs.  Family History:  Her family history includes Cystic fibrosis in her sister; Emphysema in her father; Heart disease in her mother; Hyperthyroidism in her brother and sister; Osteoarthritis in her brother; Thyroid disease in her mother.     Assessment/Plan:    COPD with emphysema and chronic bronchitis. - intolerant of LAMA's - Advair caused throat irritation and increased cough - switching from symbicort to brovana/pulmicort - prn albuterol - prn mucinex  Dyspnea. - likely from anemia, COPD, deconditioning, and diastolic CHF  Atypical chest pain. - likely related to prior rib fracture - prn OTC analagesics   Chronic respiratory failure with hypoxia. - 3 liters oxygen 24/7   Rt upper lung nodule. - has follow up CT chest scheduled with oncology for August 2023  Allergic rhinitis. - prn allegra, flonase   Time Spent Involved in Patient Care on Day of Examination:  38 minutes  Follow up:   Patient Instructions  Try using mucinex to help with cough and clear chest congestion  Follow up in 3 months  Medication List:   Allergies as of 08/25/2021       Reactions   Antihistamines, Diphenhydramine-type Other (See Comments)   Able to tolerate only allegra.    Sulfonamide Derivatives Swelling   REACTION: closed throat    Incruse Ellipta [umeclidinium Bromide] Cough   Caused voice change and severe coughing   Clarithromycin Other (See Comments)   REACTION: diff swallowing and mouth blisters   Codeine Other (See Comments)   Unknown    Lyrica [pregabalin] Other (See Comments)   Lack of effect for neuropathy pain.     Spiriva Handihaler [tiotropium Bromide Monohydrate] Other (See Comments)   Voice changes   Vraylar [cariprazine]    intolerant        Medication List        Accurate as of August 25, 2021  4:55 PM. If you have any questions, ask your nurse or doctor.          albuterol 108 (90 Base) MCG/ACT inhaler Commonly known as: ProAir HFA Inhale 2 puffs into the lungs every  6 (six) hours as needed for wheezing or shortness of breath. INHALE 2 PUFFS INTO THE LUNGS EVERY 6 HOURS AS NEEDED FOR WHEEZING OR SHORTNESS OF BREATH   arformoterol 15 MCG/2ML Nebu Commonly known as: BROVANA Take 2 mLs (15 mcg total) by nebulization 2 (two) times daily.   B-12 5000 MCG Caps Take 5,000 mcg by mouth daily.   budesonide 0.5 MG/2ML nebulizer solution Commonly known as: Pulmicort Take 2 mLs (0.5 mg total) by nebulization in the morning and at bedtime.   busPIRone 15 MG tablet Commonly known as: BUSPAR Take 15 mg by mouth 2 (two) times daily.   Coenzyme Q-10 200 MG Caps Take 200 mg by mouth daily.   cyclobenzaprine 10 MG tablet Commonly known as: FLEXERIL Take 1 tablet (10 mg total) by mouth at bedtime.   esomeprazole 40 MG capsule Commonly known as: NexIUM Take 1 capsule (40 mg total) by mouth daily at 12 noon.   ezetimibe 10 MG tablet Commonly known as: ZETIA TAKE 1 TABLET BY MOUTH AT BEDTIME   fexofenadine 180 MG tablet Commonly known as: ALLEGRA Take 180 mg by mouth as needed.   FLUoxetine 20 MG tablet Commonly known as: PROZAC Take 20 mg by mouth 2 (two) times daily.   fluticasone 50 MCG/ACT nasal spray Commonly known as: FLONASE USE 2 SPRAYS INTO THE NOSE EVERY 12 HOURS AS  NEEDED FOR STUFFY NOSE   furosemide 20 MG tablet Commonly known as: LASIX Take 1-2 tablets (20-40 mg total) by mouth daily as needed for fluid.   glucose blood test strip Commonly known as: Civil engineer, contracting Use as instructed to check sugar 2-3x time daily   OneTouch Verio test strip Generic drug: glucose blood USE AS INSTRUCTED TO CHECK SUGAR 1 TIME DAILY   guaiFENesin 600 MG 12 hr tablet Commonly known as: Mucinex Take 2 tablets (1,200 mg total) by mouth 2 (two) times daily as needed for cough or to loosen phlegm. Started by: Chesley Mires, MD   Iron (Ferrous Sulfate) 325 (65 Fe) MG Tabs Take 325 mg by mouth daily.   levothyroxine 125 MCG tablet Commonly known as: SYNTHROID Take 1 tablet (125 mcg total) by mouth daily.   metFORMIN 500 MG tablet Commonly known as: GLUCOPHAGE TAKE 1 TABLET BY MOUTH EVERY DAY IN THE EVENING   nitroGLYCERIN 0.4 MG SL tablet Commonly known as: NITROSTAT Place 1 tablet (0.4 mg total) under the tongue every 5 (five) minutes as needed for chest pain.   OneTouch Delica Lancets Fine Misc USE TO CHECK SUGAR 1 TIME DAILY   rosuvastatin 10 MG tablet Commonly known as: CRESTOR TAKE 1 TABLET (10 MG TOTAL) BY MOUTH EVERY OTHER DAY.   Spacer/Aero Chamber [employer] Use with  inhaler as needed.  J44.9   Vitamin D 50 MCG (2000 UT) tablet Take 1 tablet (2,000 Units total) by mouth daily.        Signature:  Chesley Mires, MD [place] Pager - [phone] 08/25/2021, 4:55 PM

## 2021-08-25 NOTE — Telephone Encounter (Signed)
Called and spoke with patient. She has been advised to continue to follow with hematology regarding IV iron infusions. Pt verbalized understanding and had no concerns at the end of the call.

## 2021-08-27 DIAGNOSIS — M170 Bilateral primary osteoarthritis of knee: Secondary | ICD-10-CM | POA: Diagnosis not present

## 2021-08-31 DIAGNOSIS — F411 Generalized anxiety disorder: Secondary | ICD-10-CM | POA: Diagnosis not present

## 2021-08-31 DIAGNOSIS — F32A Depression, unspecified: Secondary | ICD-10-CM | POA: Diagnosis not present

## 2021-08-31 DIAGNOSIS — E1136 Type 2 diabetes mellitus with diabetic cataract: Secondary | ICD-10-CM | POA: Diagnosis not present

## 2021-08-31 DIAGNOSIS — I5042 Chronic combined systolic (congestive) and diastolic (congestive) heart failure: Secondary | ICD-10-CM | POA: Diagnosis not present

## 2021-08-31 DIAGNOSIS — J449 Chronic obstructive pulmonary disease, unspecified: Secondary | ICD-10-CM | POA: Diagnosis not present

## 2021-08-31 DIAGNOSIS — I110 Hypertensive heart disease with heart failure: Secondary | ICD-10-CM | POA: Diagnosis not present

## 2021-09-02 ENCOUNTER — Encounter: Payer: Self-pay | Admitting: Internal Medicine

## 2021-09-05 ENCOUNTER — Other Ambulatory Visit: Payer: Self-pay | Admitting: Family Medicine

## 2021-09-11 ENCOUNTER — Telehealth: Payer: Self-pay

## 2021-09-11 ENCOUNTER — Encounter: Payer: Self-pay | Admitting: Internal Medicine

## 2021-09-11 ENCOUNTER — Ambulatory Visit (INDEPENDENT_AMBULATORY_CARE_PROVIDER_SITE_OTHER): Payer: Medicare Other | Admitting: Internal Medicine

## 2021-09-11 VITALS — BP 128/82 | HR 57 | Ht 65.0 in | Wt 217.4 lb

## 2021-09-11 DIAGNOSIS — E039 Hypothyroidism, unspecified: Secondary | ICD-10-CM | POA: Diagnosis not present

## 2021-09-11 DIAGNOSIS — I25118 Atherosclerotic heart disease of native coronary artery with other forms of angina pectoris: Secondary | ICD-10-CM

## 2021-09-11 DIAGNOSIS — E785 Hyperlipidemia, unspecified: Secondary | ICD-10-CM

## 2021-09-11 DIAGNOSIS — E1140 Type 2 diabetes mellitus with diabetic neuropathy, unspecified: Secondary | ICD-10-CM | POA: Diagnosis not present

## 2021-09-11 LAB — TSH: TSH: 1.4 u[IU]/mL (ref 0.35–5.50)

## 2021-09-11 LAB — POCT GLYCOSYLATED HEMOGLOBIN (HGB A1C): Hemoglobin A1C: 5 % (ref 4.0–5.6)

## 2021-09-11 LAB — T4, FREE: Free T4: 1.24 ng/dL (ref 0.60–1.60)

## 2021-09-11 MED ORDER — METFORMIN HCL 500 MG PO TABS: 500.0000 mg | ORAL_TABLET | Freq: Every evening | ORAL | 3 refills | Status: DC

## 2021-09-11 MED ORDER — LEVOTHYROXINE SODIUM 125 MCG PO TABS: 125.0000 ug | ORAL_TABLET | Freq: Every day | ORAL | 3 refills | Status: DC

## 2021-09-11 MED ORDER — ONETOUCH VERIO VI STRP: ORAL_STRIP | 8 refills | Status: DC

## 2021-09-11 NOTE — Telephone Encounter (Signed)
This nurse attempted to call patient three times for scheduled telephonic AWV. Message left that we will call again to reschedule for another time.

## 2021-09-11 NOTE — Patient Instructions (Addendum)
Please continue Synthroid 125 mcg daily.  Take the thyroid hormone every day, with water, at least 30 minutes before breakfast, separated by at least 4 hours from: - acid reflux medications - calcium - iron - multivitamins  Please continue: - Metformin 500 mg with supper  Please stop at the lab.  Please return in 6 months with your sugar log.

## 2021-09-11 NOTE — Progress Notes (Signed)
Patient ID: Alexandria [name], female   DOB: September 30, 1935, 86 y.o.   MRN: [id number]  HPI: Alexandria [name] is a 86 y.o.-year-old female, returning for follow-up for DM2, dx in 2006, non-insulin-dependent, controlled, with complications (mild CKD) and uncontrolled hypothyroidism. Last visit 11 months ago. At today's visit, the conversation was also carried out with her daughter, on the phone.  Interim history: She was diagnosed with lung cancer in 2021.  She had radiation therapy.  Continues to be short of breath and wears oxygen.  She has anxiety. She had a rib fracture in 06/2021. She has fatigue, which is not new. She had chest pain -now has nitroglycerin at hand. She also has had more leg swelling, now improved.  DM2: Reviewed HbA1c levels: Lab Results  Component Value Date   HGBA1C 5.0 10/28/2020   HGBA1C 5.1 04/29/2020   HGBA1C 5.3 10/31/2019   Pt is on: - Metformin 500 mg with dinner She was on Januvia >> stopped in summer 2016, when she was admitted for sepsis. We did not restart afterwards as her sugars remained controlled.  Pt is checking sugars once a day - meter broke 1 mo ago: - am: 80s-100s >> 116, 122 >> 97-107 >> 130-140 >> up to 140 - 2h after brunch:  124-195 >> n/c >> 175 >> n/c - before lunch: 103, 109 >> n/c >> cannot remember - before dinner: 114-152 >> n/c >> 125 >> 105 >> n/c - 2h after dinner:  96-158 >> n/c >> up to 180 >> 90-120 >> cannot remember - bedtime: 124, 147 >> n/c >> 133 >> n/c - nighttime: n/c >> 169, 175 >> n/c Lowest sugar was 97 >> 90 >> 90; she has hypoglycemia awareness in the 60s Highest sugar was 180-200 (banana, potatoes) >> 150 >> 250 x1 (banana or cake).  Glucometer:One Touch Ultra mini >> One Touch Verio  Pt's meals are:  - Breakfast: protein drink, egg, cereals >> cottage cheese, pineapple, deviled egg + tomato or fruit - Lunch: sandwich - Dinner: meat + 2 veggies - Snacks: 1-3 peanut butter, milk, crackers  -+ Mild CKD, last  BUN/creatinine:  Lab Results  Component Value Date   BUN 22 08/06/2021   CREATININE 0.82 08/06/2021  On valsartan.  -+ HL; last set of lipids: Lab Results  Component Value Date   CHOL 140 08/06/2021   HDL 57.80 08/06/2021   LDLCALC 62 08/06/2021   LDLDIRECT 63.0 07/29/2015   TRIG 99.0 08/06/2021   CHOLHDL 2 08/06/2021  On Crestor 10 every other day, Zetia 10 daily  - last eye exam was: 06/2021: No DR, + cataract.   -No numbness and tingling in her feet.  Hypothyroidism. -Uncontrolled -needs Synthroid DAW due to fluctuating TFTs.  Pt is on Synthroid 125 mcg daily (decreased since last visit), taken: - in am - fasting - at least 30 min from b'fast - + calcium, iron, multivitamins, acid reflux medicines more than 4 hours after levothyroxine - not on Biotin  Reviewed her TFTs: Lab Results  Component Value Date   TSH 1.24 08/06/2021   TSH 0.17 (L) 06/08/2021   TSH 0.73 11/21/2020   TSH 3.60 09/12/2020   TSH 0.80 04/29/2020   TSH 0.29 (L) 01/25/2020   TSH 9.48 (H) 10/31/2019   TSH 6.56 (H) 08/30/2019   TSH 18.54 (H) 05/02/2019   TSH 41.98 (H) 11/01/2018   TSH 3.83 12/22/2017   TSH 0.80 02/09/2017   TSH 0.93 01/01/2016   TSH 1.75 03/14/2015  TSH 2.83 01/24/2015   TSH 0.18 (L) 12/04/2014   TSH 0.55 10/18/2014   TSH 0.341 (L) 09/29/2014   TSH 0.26 (L) 10/02/2010   TSH 0.34 02/19/2010   Pt denies: - feeling nodules in neck - hoarseness - dysphagia - choking  She has a h/o COPD- Dr. Halford Chessman, HTN, anemia, GERD. She fell in 09/2015 >> hurt R leg (had many stitches) and R orbit. She was in rehab afterwards.  ROS: + See HPI  I reviewed pt's medications, allergies, PMH, social hx, family hx, and changes were documented in the history of present illness. Otherwise, unchanged from my initial visit note.  Past Medical History:  Diagnosis Date   Allergy, unspecified not elsewhere classified    Anxiety    Cataract    Chronic diastolic congestive heart failure  (HCC)    COPD (chronic obstructive pulmonary disease) ([place]) 03/08/1998   PFTs 12/12/2002 FEV 1 1.42 (64%) ratio 58 with no better after B2 and DLCO75%; PFTs 11/20/09 FEV1 1.50 (73%) ratio 50 no better after B2 with DLCO 62%; Hfa 50% 11/20/2009 >75%, 01/13/10 p coaching   Depression 12/07/1998   Diabetes mellitus type II 02/05/2006   Dr. Cruzita Lederer with endo   Disorders of bursae and tendons in shoulder region, unspecified    Rotator cuff syndrome, right   E. coli bacteremia    Esophagitis    GERD (gastroesophageal reflux disease)    History of UTI    Hyperlipidemia 10/04/2000   Hypertension 03/08/1992   Hypothyroidism 03/08/1968   Iron deficiency anemia    Lung nodule    radiation starts 10-08-2019   Malignant neoplasm of right upper lobe of lung ([place]) 07/24/2019   Obesity    NOS   OSA (obstructive sleep apnea)    PSG 01/27/10 AHI 13, pt does not know CPAP settings   Peripheral neuropathy    Likely due to DM per Dr. Murvin Natal hernia    Past Surgical History:  Procedure Laterality Date   ABD U/S  03/19/1999   Nml x2 foci in liver   ADENOSINE MYOVIEW  06/02/2007   Nml   CARDIOLITE PERSANTINE  08/24/2000   Nml   CAROTID U/S  08/24/2000   1-39% ICA stenosis   CAROTID U/S  06/02/2007   No apprec change    CARPAL TUNNEL RELEASE  12/1997   Right   CESAREAN SECTION     x2 [place]/ repeat   CHOLECYSTECTOMY  1997   COLONOSCOPY WITH PROPOFOL N/A 02/12/2016   Procedure: COLONOSCOPY WITH PROPOFOL;  Surgeon: Milus Banister, MD;  Location: [facility];  Service: Endoscopy;  Laterality: N/A;   CT ABD W & PELVIS WO/W CM     Abd hemangiomas of liver, 1 cm R renal cyst   DENTAL SURGERY  2016   Implants   DEXA  07/03/2003   Nml   ESOPHAGOGASTRODUODENOSCOPY  12/05/1997   Nml (due to hoarseness)   ESOPHAGOGASTRODUODENOSCOPY (EGD) WITH PROPOFOL N/A 02/12/2016   Procedure: ESOPHAGOGASTRODUODENOSCOPY (EGD) WITH PROPOFOL;  Surgeon: Milus Banister, MD;  Location: [facility];  Service:  Endoscopy;  Laterality: N/A;   GALLBLADDER SURGERY     HERNIA REPAIR  01/24/2009   Lap Ventr w/ Lysis of adhesions (Dr. Donne Hazel)   knee arthroscopic surgery  years ago   right   ROTATOR CUFF REPAIR  1984   Right, [place]   SHOULDER OPEN ROTATOR CUFF REPAIR  02/08/2012   Procedure: ROTATOR CUFF REPAIR SHOULDER OPEN;  Surgeon: Magnus Sinning, MD;  Location: [facility];  Service: Orthopedics;  Laterality: Left;  Left Shoulder Open Anterior Acrominectomy Rotator Cuff Repair Open Distal Clavicle Resection ,tissue mend graft, and repair of biceps tendon   THUMB RELEASE  12/1997   Right   TONSILLECTOMY     TOTAL ABDOMINAL HYSTERECTOMY  1985   Due to dysmennorhea   US ECHOCARDIOGRAPHY  06/02/2007   Social History   Occupational History   Retired - self employed- Engineer, structural    Social History Main Topics   Smoking status: Former Smoker -- 2.00 packs/day for 50 years    Types: Cigarettes    Quit date: 2007   Smokeless tobacco: Never Used   Alcohol Use: 1.2 oz/week    2 Standard drinks or equivalent per week     Comment: occasional   Drug Use: No   Sexual Activity: Not on file   Social History Narrative   Married with 2 children   Enjoys painting   Current Outpatient Medications on File Prior to Visit  Medication Sig Dispense Refill   albuterol (PROAIR HFA) 108 (90 Base) MCG/ACT inhaler Inhale 2 puffs into the lungs every 6 (six) hours as needed for wheezing or shortness of breath. INHALE 2 PUFFS INTO THE LUNGS EVERY 6 HOURS AS NEEDED FOR WHEEZING OR SHORTNESS OF BREATH 3 Inhaler 3   arformoterol (BROVANA) 15 MCG/2ML NEBU Take 2 mLs (15 mcg total) by nebulization 2 (two) times daily. 360 mL 3   budesonide (PULMICORT) 0.5 MG/2ML nebulizer solution Take 2 mLs (0.5 mg total) by nebulization in the morning and at bedtime. 360 mL 3   busPIRone (BUSPAR) 15 MG tablet Take 15 mg by mouth 2 (two) times daily.     Cholecalciferol (VITAMIN D) 50 MCG (2000 UT) tablet Take 1 tablet  (2,000 Units total) by mouth daily.     Coenzyme Q-10 200 MG CAPS Take 200 mg by mouth daily.     Cyanocobalamin (B-12) 5000 MCG CAPS Take 5,000 mcg by mouth daily.     cyclobenzaprine (FLEXERIL) 10 MG tablet Take 1 tablet (10 mg total) by mouth at bedtime. 10 tablet 0   esomeprazole (NEXIUM) 40 MG capsule Take 1 capsule (40 mg total) by mouth daily at 12 noon. 30 capsule 11   ezetimibe (ZETIA) 10 MG tablet TAKE 1 TABLET BY MOUTH AT BEDTIME 90 tablet 2   fexofenadine (ALLEGRA) 180 MG tablet Take 180 mg by mouth as needed.      FLUoxetine (PROZAC) 20 MG tablet Take 20 mg by mouth 2 (two) times daily.     fluticasone (FLONASE) 50 MCG/ACT nasal spray USE 2 SPRAYS INTO THE NOSE EVERY 12 HOURS AS NEEDED FOR STUFFY NOSE 48 mL 2   furosemide (LASIX) 20 MG tablet Take 1-2 tablets (20-40 mg total) by mouth daily as needed for fluid. 90 tablet 1   glucose blood (ONETOUCH VERIO) test strip Use as instructed to check sugar 2-3x time daily 200 each 5   guaiFENesin (MUCINEX) 600 MG 12 hr tablet Take 2 tablets (1,200 mg total) by mouth 2 (two) times daily as needed for cough or to loosen phlegm.     Iron, Ferrous Sulfate, 325 (65 Fe) MG TABS Take 325 mg by mouth daily. 90 tablet 3   levothyroxine (SYNTHROID) 125 MCG tablet Take 1 tablet (125 mcg total) by mouth daily. 90 tablet 1   metFORMIN (GLUCOPHAGE) 500 MG tablet TAKE 1 TABLET BY MOUTH EVERY DAY IN THE EVENING 30 tablet 3   nitroGLYCERIN (NITROSTAT) 0.4  MG SL tablet Place 1 tablet (0.4 mg total) under the tongue every 5 (five) minutes as needed for chest pain. 25 tablet 3   ONETOUCH DELICA LANCETS FINE MISC USE TO CHECK SUGAR 1 TIME DAILY 100 each 5   ONETOUCH VERIO test strip USE AS INSTRUCTED TO CHECK SUGAR 1 TIME DAILY 100 strip 8   rosuvastatin (CRESTOR) 10 MG tablet TAKE 1 TABLET (10 MG TOTAL) BY MOUTH EVERY OTHER DAY. 45 tablet 2   Spacer/Aero Chamber Mouthpiece [employer]ISC Use with  inhaler as needed.  J44.9 1 each 0   No current facility-administered  medications on file prior to visit.   Allergies  Allergen Reactions   Antihistamines, Diphenhydramine-Type Other (See Comments)    Able to tolerate only allegra.    Sulfonamide Derivatives Swelling    REACTION: closed throat   Incruse Ellipta [Umeclidinium Bromide] Cough    Caused voice change and severe coughing   Clarithromycin Other (See Comments)    REACTION: diff swallowing and mouth blisters   Codeine Other (See Comments)    Unknown    Lyrica [Pregabalin] Other (See Comments)    Lack of effect for neuropathy pain.     Spiriva Handihaler [Tiotropium Bromide Monohydrate] Other (See Comments)    Voice changes   Vraylar [Cariprazine]     intolerant   Family History  Problem Relation Age of Onset   Heart disease Mother    Thyroid disease Mother    Emphysema Father        One lung   Cystic fibrosis Sister    Hyperthyroidism Sister    Osteoarthritis Brother    Hyperthyroidism Brother    Esophageal cancer Neg Hx    Cancer Neg Hx        Head or neck   Colon cancer Neg Hx    Stomach cancer Neg Hx    Breast cancer Neg Hx    PE: LMP  (LMP Unknown)  There is no height or weight on file to calculate BMI.  Wt Readings from Last 3 Encounters:  08/25/21 219 lb 6.4 oz (99.5 kg)  08/17/21 217 lb 9.6 oz (98.7 kg)  08/10/21 216 lb (98 kg)   Constitutional: overweight, in NAD, + walks with a walker- on 3 L oxygen now Eyes: PERRLA, EOMI, no exophthalmos ENT: moist mucous membranes, no thyromegaly, no cervical lymphadenopathy Cardiovascular: RRR, No RG, +1/6 SEM, + R LE edema Respiratory: CTA B Musculoskeletal: no deformities, strength intact in all 4 Skin: moist, warm, no rashes, + right LE erythema on the lower leg-chronic after surgery on her right knee; + dry skin Neurological: no tremor with outstretched hands, DTR normal in all 4  ASSESSMENT: 1. DM2, non-insulin-dependent, controlled, with complications - mild CKD  2. Hypothyroidism  3. HL  PLAN:  1. Patient with  longstanding, previously uncontrolled, type 2 diabetes, on low-dose metformin, with good control lately.  Her HbA1c at last visit was 5.0%, possibly influenced by her anemia. -At last visit, sugars were controlled later in the day but they were slightly above target in the morning.  We discussed that for her age, blood sugars in the 130s to 140 fasting would be acceptable.  We did not change her regimen. -At today's visit, she was not able to check blood sugars in the last month after her meter broke.  We gave her another meter today and send supplies to her pharmacy.  We again discussed that sugars between 130 and 140 are at goal.  We do not  need to change her regimen.  I refilled her metformin. - I advised her to: Patient Instructions  Please continue Synthroid 125 mcg daily.  Take the thyroid hormone every day, with water, at least 30 minutes before breakfast, separated by at least 4 hours from: - acid reflux medications - calcium - iron - multivitamins  Please continue: - Metformin 500 mg with supper  Please stop at the lab.  Please return in 6 months with your sugar log.   - we checked her HbA1c: 5.0% (stable) - advised to check sugars at different times of the day - 1x a day, rotating check times - advised for yearly eye exams >> she is UTD - return to clinic in 6 months  2. Hypothyroidism - latest thyroid labs reviewed with pt. >> normal: Lab Results  Component Value Date   TSH 1.24 08/06/2021  - she continues on Synthroid 125 mcg daily, which the daughter mentions that they decreased by themselves 2 weeks prior to the above TSH, as they had this dose at home. - pt feels good on this dose. - we discussed about taking the thyroid hormone every day, with water, >30 minutes before breakfast, separated by >4 hours from acid reflux medications, calcium, iron, multivitamins. Pt. is taking it correctly. - We will recheck her TFTs today  3. HL -Reviewed latest lipid panel from  08/2021: Fractions at goal: Lab Results  Component Value Date   CHOL 140 08/06/2021   HDL 57.80 08/06/2021   LDLCALC 62 08/06/2021   LDLDIRECT 63.0 07/29/2015   TRIG 99.0 08/06/2021   CHOLHDL 2 08/06/2021  -She continues on Crestor 10 mg daily and Zetia 10 mg daily, w/o SEs  Needs refills.  Component     Latest Ref Rng 09/11/2021  TSH     0.35 - 5.50 uIU/mL 1.40   T4,Free(Direct)     0.60 - 1.60 ng/dL 1.24     Philemon Kingdom, MD PhD [facility]

## 2021-09-15 ENCOUNTER — Telehealth: Payer: Self-pay | Admitting: Family Medicine

## 2021-09-15 DIAGNOSIS — F411 Generalized anxiety disorder: Secondary | ICD-10-CM | POA: Diagnosis not present

## 2021-09-15 DIAGNOSIS — E1136 Type 2 diabetes mellitus with diabetic cataract: Secondary | ICD-10-CM | POA: Diagnosis not present

## 2021-09-15 DIAGNOSIS — J449 Chronic obstructive pulmonary disease, unspecified: Secondary | ICD-10-CM | POA: Diagnosis not present

## 2021-09-15 DIAGNOSIS — I5042 Chronic combined systolic (congestive) and diastolic (congestive) heart failure: Secondary | ICD-10-CM | POA: Diagnosis not present

## 2021-09-15 DIAGNOSIS — I110 Hypertensive heart disease with heart failure: Secondary | ICD-10-CM | POA: Diagnosis not present

## 2021-09-15 DIAGNOSIS — F32A Depression, unspecified: Secondary | ICD-10-CM | POA: Diagnosis not present

## 2021-09-15 NOTE — Telephone Encounter (Signed)
[place] Name: [facility] Agency Name: [place] Phone #: [phone] Direct line Service Requested: Nursing (examples: OT/PT/Skilled Nursing/Social Work/Speech Therapy/Wound Care) Frequency of Visits: Recerted patient today for continued services, 1 visit every other week for next 9 weeks for respiratory assessment

## 2021-09-16 NOTE — Telephone Encounter (Signed)
Please give the order.  Thanks.   

## 2021-09-17 ENCOUNTER — Other Ambulatory Visit: Payer: Self-pay | Admitting: Internal Medicine

## 2021-09-17 ENCOUNTER — Telehealth: Payer: Self-pay | Admitting: Family Medicine

## 2021-09-17 NOTE — Telephone Encounter (Signed)
Left message for patient to call back and schedule Medicare Annual Wellness Visit (AWV) either virtually or phone   Last AWV 02/05/20   I left my direct # [phone]

## 2021-09-17 NOTE — Telephone Encounter (Signed)
Verbal orders have been given to [employer]

## 2021-09-19 DIAGNOSIS — Z9049 Acquired absence of other specified parts of digestive tract: Secondary | ICD-10-CM | POA: Diagnosis not present

## 2021-09-19 DIAGNOSIS — Z85118 Personal history of other malignant neoplasm of bronchus and lung: Secondary | ICD-10-CM | POA: Diagnosis not present

## 2021-09-19 DIAGNOSIS — I110 Hypertensive heart disease with heart failure: Secondary | ICD-10-CM | POA: Diagnosis not present

## 2021-09-19 DIAGNOSIS — Z90710 Acquired absence of both cervix and uterus: Secondary | ICD-10-CM | POA: Diagnosis not present

## 2021-09-19 DIAGNOSIS — G4733 Obstructive sleep apnea (adult) (pediatric): Secondary | ICD-10-CM | POA: Diagnosis not present

## 2021-09-19 DIAGNOSIS — E038 Other specified hypothyroidism: Secondary | ICD-10-CM | POA: Diagnosis not present

## 2021-09-19 DIAGNOSIS — Z87440 Personal history of urinary (tract) infections: Secondary | ICD-10-CM | POA: Diagnosis not present

## 2021-09-19 DIAGNOSIS — E669 Obesity, unspecified: Secondary | ICD-10-CM | POA: Diagnosis not present

## 2021-09-19 DIAGNOSIS — Z7984 Long term (current) use of oral hypoglycemic drugs: Secondary | ICD-10-CM | POA: Diagnosis not present

## 2021-09-19 DIAGNOSIS — E1142 Type 2 diabetes mellitus with diabetic polyneuropathy: Secondary | ICD-10-CM | POA: Diagnosis not present

## 2021-09-19 DIAGNOSIS — F32A Depression, unspecified: Secondary | ICD-10-CM | POA: Diagnosis not present

## 2021-09-19 DIAGNOSIS — Z6835 Body mass index (BMI) 35.0-35.9, adult: Secondary | ICD-10-CM | POA: Diagnosis not present

## 2021-09-19 DIAGNOSIS — I5042 Chronic combined systolic (congestive) and diastolic (congestive) heart failure: Secondary | ICD-10-CM | POA: Diagnosis not present

## 2021-09-19 DIAGNOSIS — M7550 Bursitis of unspecified shoulder: Secondary | ICD-10-CM | POA: Diagnosis not present

## 2021-09-19 DIAGNOSIS — Z87891 Personal history of nicotine dependence: Secondary | ICD-10-CM | POA: Diagnosis not present

## 2021-09-19 DIAGNOSIS — R32 Unspecified urinary incontinence: Secondary | ICD-10-CM | POA: Diagnosis not present

## 2021-09-19 DIAGNOSIS — Z9181 History of falling: Secondary | ICD-10-CM | POA: Diagnosis not present

## 2021-09-19 DIAGNOSIS — F411 Generalized anxiety disorder: Secondary | ICD-10-CM | POA: Diagnosis not present

## 2021-09-19 DIAGNOSIS — Z7951 Long term (current) use of inhaled steroids: Secondary | ICD-10-CM | POA: Diagnosis not present

## 2021-09-19 DIAGNOSIS — Z79899 Other long term (current) drug therapy: Secondary | ICD-10-CM | POA: Diagnosis not present

## 2021-09-19 DIAGNOSIS — D509 Iron deficiency anemia, unspecified: Secondary | ICD-10-CM | POA: Diagnosis not present

## 2021-09-19 DIAGNOSIS — E782 Mixed hyperlipidemia: Secondary | ICD-10-CM | POA: Diagnosis not present

## 2021-09-19 DIAGNOSIS — J449 Chronic obstructive pulmonary disease, unspecified: Secondary | ICD-10-CM | POA: Diagnosis not present

## 2021-09-19 DIAGNOSIS — E1136 Type 2 diabetes mellitus with diabetic cataract: Secondary | ICD-10-CM | POA: Diagnosis not present

## 2021-09-19 DIAGNOSIS — K219 Gastro-esophageal reflux disease without esophagitis: Secondary | ICD-10-CM | POA: Diagnosis not present

## 2021-09-23 ENCOUNTER — Inpatient Hospital Stay: Payer: Medicare Other | Attending: Internal Medicine

## 2021-09-23 ENCOUNTER — Inpatient Hospital Stay (HOSPITAL_BASED_OUTPATIENT_CLINIC_OR_DEPARTMENT_OTHER): Payer: Medicare Other | Admitting: Internal Medicine

## 2021-09-23 ENCOUNTER — Encounter: Payer: Self-pay | Admitting: Internal Medicine

## 2021-09-23 ENCOUNTER — Inpatient Hospital Stay: Payer: Medicare Other

## 2021-09-23 VITALS — BP 122/56 | HR 75

## 2021-09-23 VITALS — BP 120/56 | HR 45 | Temp 98.7°F | Resp 16 | Ht 65.0 in | Wt 217.0 lb

## 2021-09-23 DIAGNOSIS — D649 Anemia, unspecified: Secondary | ICD-10-CM | POA: Diagnosis not present

## 2021-09-23 DIAGNOSIS — Z79899 Other long term (current) drug therapy: Secondary | ICD-10-CM | POA: Insufficient documentation

## 2021-09-23 DIAGNOSIS — D509 Iron deficiency anemia, unspecified: Secondary | ICD-10-CM | POA: Diagnosis not present

## 2021-09-23 DIAGNOSIS — Z85118 Personal history of other malignant neoplasm of bronchus and lung: Secondary | ICD-10-CM | POA: Diagnosis not present

## 2021-09-23 DIAGNOSIS — Z87891 Personal history of nicotine dependence: Secondary | ICD-10-CM | POA: Diagnosis not present

## 2021-09-23 LAB — CBC WITH DIFFERENTIAL/PLATELET
Abs Immature Granulocytes: 0.03 10*3/uL (ref 0.00–0.07)
Basophils Absolute: 0 10*3/uL (ref 0.0–0.1)
Basophils Relative: 0 %
Eosinophils Absolute: 0.1 10*3/uL (ref 0.0–0.5)
Eosinophils Relative: 1 %
HCT: 29.9 % — ABNORMAL LOW (ref 36.0–46.0)
Hemoglobin: 8.7 g/dL — ABNORMAL LOW (ref 12.0–15.0)
Immature Granulocytes: 1 %
Lymphocytes Relative: 19 %
Lymphs Abs: 1.1 10*3/uL (ref 0.7–4.0)
MCH: 27.8 pg (ref 26.0–34.0)
MCHC: 29.1 g/dL — ABNORMAL LOW (ref 30.0–36.0)
MCV: 95.5 fL (ref 80.0–100.0)
Monocytes Absolute: 0.4 10*3/uL (ref 0.1–1.0)
Monocytes Relative: 7 %
Neutro Abs: 3.9 10*3/uL (ref 1.7–7.7)
Neutrophils Relative %: 72 %
Platelets: 176 10*3/uL (ref 150–400)
RBC: 3.13 MIL/uL — ABNORMAL LOW (ref 3.87–5.11)
RDW: 14.7 % (ref 11.5–15.5)
WBC: 5.5 10*3/uL (ref 4.0–10.5)
nRBC: 0 % (ref 0.0–0.2)

## 2021-09-23 LAB — BASIC METABOLIC PANEL
Anion gap: 5 (ref 5–15)
BUN: 24 mg/dL — ABNORMAL HIGH (ref 8–23)
CO2: 31 mmol/L (ref 22–32)
Calcium: 8.3 mg/dL — ABNORMAL LOW (ref 8.9–10.3)
Chloride: 104 mmol/L (ref 98–111)
Creatinine, Ser: 0.8 mg/dL (ref 0.44–1.00)
GFR, Estimated: >60 mL/min (ref >60)
Glucose, Bld: 171 mg/dL — ABNORMAL HIGH (ref 70–99)
Potassium: 4.2 mmol/L (ref 3.5–5.1)
Sodium: 140 mmol/L (ref 135–145)

## 2021-09-23 LAB — IRON AND TIBC
Iron: 32 ug/dL (ref 28–170)
Saturation Ratios: 9 % — ABNORMAL LOW (ref 10.4–31.8)
TIBC: 374 ug/dL (ref 250–450)
UIBC: 342 ug/dL

## 2021-09-23 LAB — FERRITIN: Ferritin: 10 ng/mL — ABNORMAL LOW (ref 11–307)

## 2021-09-23 MED ORDER — SODIUM CHLORIDE 0.9 % IV SOLN
Freq: Once | INTRAVENOUS | Status: AC
  Filled 2021-09-23: qty 250

## 2021-09-23 MED ORDER — SODIUM CHLORIDE 0.9 % IV SOLN
200.0000 mg | Freq: Once | INTRAVENOUS | Status: AC
  Administered 2021-09-23: 200 mg via INTRAVENOUS
  Filled 2021-09-23: qty 200

## 2021-09-23 NOTE — Assessment & Plan Note (Addendum)
#   Iron deficiency Anemia [OCT 2022- Ferritin-7]; Symptomatic -chronic intermittent [SINCE 2010]; s/p IV Venofer.  # Worsening anemia: Hemoglobin today - 8.7; improved from 7.1 from May 2023.  Proceed with iron infusions x3  including 1 today.  # ETIOLOGY:  UNCLEAR- CT CT AP- JAN- 11th, 2023- Mild wall thickening of the proximal duodenum at least in part related to under distension, may reflect peptic ulcer disease/duodenitis.  Status post evaluation with Dr. Ardis Hughs in [place] 2023]-as per patient patient is not a candidate for any upper and lower endoscopies.  So monitor for now    # DISPOSITION: [wed pref] # Venofer today # venofer weekly x 3 ## follow up- 2 m- MD; cbc/bmp; possible-- venofer- Dr.B

## 2021-09-23 NOTE — Progress Notes (Signed)
[place] NOTE  Patient Care Team: Tonia Ghent, MD as PCP - General Rockey Situ, Kathlene November, MD as Consulting Physician (Cardiology) [name], NP as Nurse Practitioner (Adult Health Nurse Practitioner) Cammie Sickle, MD as Consulting Physician (Oncology)  CHIEF COMPLAINTS/PURPOSE OF CONSULTATION: ANEMIA   HEMATOLOGY HISTORY:  # CHRONIC INTERMITTENT ANEMIA  [since 2010]- Hb SEP 2022- 8; previously on PO Iron- ; WBC/platelets- Normal; MCV- 90s. EGD- > 15 years; /Colonoscopy:4 years- [Dr.Jacob;; in [place]] capsule-? Bone marrow Biopsy-none; OCT 2022- SEVERE IRON DEFICIENCY; myeloma work-up/hemolysis work-up negative; JAN 2023-CT scan duodenal thickening-recommend GI evaluation  #   AUG 2021- RIGHT LUNG stage I non-small cell lung cancer-  s/p SBRT   [Dr.Moody; [place]]; NOV 2022- CT chest-no recurrent disease.  # COPD on 2.5 lit/day [Dr.Sood]; diastolic CHF/ OCT 7829-FAO BP [needing to come off losartan- Dr.Gollan]; peripheral neuropathy.  HISTORY OF PRESENTING ILLNESS: Alone; 2.5 Lit/ O2 24 x7. In wheel chair.    Alexandria [name] 86 y.o.  female with chronic respiratory failure on oxygen and severe anemia secondary to iron deficiency of unclear etiology s/p venofer is here for follow-up.  In the interim patient was evaluated by gastroenterology Dr. Ardis Hughs in [place].  Patient continues to complain of ongoing fatigue.  However improved since last visit.  She continues to deny any blood in stools or black-colored stools.    Review of Systems  Constitutional:  Positive for malaise/fatigue. Negative for chills, diaphoresis, fever and weight loss.  HENT:  Negative for nosebleeds and sore throat.   Eyes:  Negative for double vision.  Respiratory:  Positive for cough and shortness of breath. Negative for hemoptysis, sputum production and wheezing.   Cardiovascular:  Negative for chest pain, palpitations, orthopnea and leg swelling.  Gastrointestinal:   Negative for abdominal pain, blood in stool, constipation, diarrhea, heartburn, melena, nausea and vomiting.  Genitourinary:  Negative for dysuria, frequency and urgency.  Musculoskeletal:  Negative for back pain and joint pain.  Skin: Negative.  Negative for itching and rash.  Neurological:  Positive for dizziness. Negative for tingling, focal weakness, weakness and headaches.  Endo/Heme/Allergies:  Does not bruise/bleed easily.  Psychiatric/Behavioral:  Negative for depression. The patient is not nervous/anxious and does not have insomnia.     MEDICAL HISTORY:  Past Medical History:  Diagnosis Date  . Allergy, unspecified not elsewhere classified   . Anxiety   . Cataract   . Chronic diastolic congestive heart failure ([place])   . COPD (chronic obstructive pulmonary disease) ([place]) 03/08/1998   PFTs 12/12/2002 FEV 1 1.42 (64%) ratio 58 with no better after B2 and DLCO75%; PFTs 11/20/09 FEV1 1.50 (73%) ratio 50 no better after B2 with DLCO 62%; Hfa 50% 11/20/2009 >75%, 01/13/10 p coaching  . Depression 12/07/1998  . Diabetes mellitus type II 02/05/2006   Dr. Cruzita Lederer with endo  . Disorders of bursae and tendons in shoulder region, unspecified    Rotator cuff syndrome, right  . E. coli bacteremia   . Esophagitis   . GERD (gastroesophageal reflux disease)   . History of UTI   . Hyperlipidemia 10/04/2000  . Hypertension 03/08/1992  . Hypothyroidism 03/08/1968  . Iron deficiency anemia   . Lung nodule    radiation starts 10-08-2019  . Malignant neoplasm of right upper lobe of lung ([place]) 07/24/2019  . Obesity    NOS  . OSA (obstructive sleep apnea)    PSG 01/27/10 AHI 13, pt does not know CPAP settings  . Peripheral neuropathy  Likely due to DM per Dr. Erling Cruz  . Ventral hernia     SURGICAL HISTORY: Past Surgical History:  Procedure Laterality Date  . ABD U/S  03/19/1999   Nml x2 foci in liver  . ADENOSINE MYOVIEW  06/02/2007   Nml  . CARDIOLITE PERSANTINE  08/24/2000   Nml  .  CAROTID U/S  08/24/2000   1-39% ICA stenosis  . CAROTID U/S  06/02/2007   No apprec change   . CARPAL TUNNEL RELEASE  12/1997   Right  . CESAREAN SECTION     x2 Breech/ repeat  . CHOLECYSTECTOMY  1997  . COLONOSCOPY WITH PROPOFOL N/A 02/12/2016   Procedure: COLONOSCOPY WITH PROPOFOL;  Surgeon: Milus Banister, MD;  Location: [facility];  Service: Endoscopy;  Laterality: N/A;  . CT ABD W & PELVIS WO/W CM     Abd hemangiomas of liver, 1 cm R renal cyst  . DENTAL SURGERY  2016   Implants  . DEXA  07/03/2003   Nml  . ESOPHAGOGASTRODUODENOSCOPY  12/05/1997   Nml (due to hoarseness)  . ESOPHAGOGASTRODUODENOSCOPY (EGD) WITH PROPOFOL N/A 02/12/2016   Procedure: ESOPHAGOGASTRODUODENOSCOPY (EGD) WITH PROPOFOL;  Surgeon: Milus Banister, MD;  Location: [facility];  Service: Endoscopy;  Laterality: N/A;  . GALLBLADDER SURGERY    . HERNIA REPAIR  01/24/2009   Lap Ventr w/ Lysis of adhesions (Dr. Donne Hazel)  . knee arthroscopic surgery  years ago   right  . ROTATOR CUFF REPAIR  1984   Right, [place]  . SHOULDER OPEN ROTATOR CUFF REPAIR  02/08/2012   Procedure: ROTATOR CUFF REPAIR SHOULDER OPEN;  Surgeon: Magnus Sinning, MD;  Location: [facility];  Service: Orthopedics;  Laterality: Left;  Left Shoulder Open Anterior Acrominectomy Rotator Cuff Repair Open Distal Clavicle Resection ,tissue mend graft, and repair of biceps tendon  . THUMB RELEASE  12/1997   Right  . TONSILLECTOMY    . TOTAL ABDOMINAL HYSTERECTOMY  1985   Due to dysmennorhea  . US ECHOCARDIOGRAPHY  06/02/2007    SOCIAL HISTORY: Social History   Socioeconomic History  . Marital status: Widowed    Spouse name: Not on file  . Number of children: 2  . Years of education: Not on file  . Highest education level: Not on file  Occupational History  . Occupation: Retired    Fish farm manager: RETIRED  Tobacco Use  . Smoking status: Former    Packs/day: 2.00    Years: 50.00    Total pack years: 100.00    Types: Cigarettes     Quit date: 02/05/2005    Years since quitting: 16.6  . Smokeless tobacco: Never  Vaping Use  . Vaping Use: Never used  Substance and Sexual Activity  . Alcohol use: Yes    Comment: occasionally  . Drug use: Never  . Sexual activity: Not Currently  Other Topics Concern  . Not on file  Social History Narrative   Widowed 2023, 2 children; Enjoys painting      Quit smoking in 2007; no alcohol; lives in [place]; [name]/d lives in [place]. Had own business. Dont drive- sec to neuropathy.    Social Determinants of Health   Financial Resource Strain: Not on file  Food Insecurity: Not on file  Transportation Needs: Not on file  Physical Activity: Not on file  Stress: Not on file  Social Connections: Not on file  Intimate Partner Violence: Not on file    FAMILY HISTORY: Family History  Problem Relation Age of Onset  .  Heart disease Mother   . Thyroid disease Mother   . Emphysema Father        One lung  . Cystic fibrosis Sister   . Hyperthyroidism Sister   . Osteoarthritis Brother   . Hyperthyroidism Brother   . Esophageal cancer Neg Hx   . Cancer Neg Hx        Head or neck  . Colon cancer Neg Hx   . Stomach cancer Neg Hx   . Breast cancer Neg Hx     ALLERGIES:  is allergic to antihistamines, diphenhydramine-type; sulfonamide derivatives; incruse ellipta [umeclidinium bromide]; clarithromycin; codeine; lyrica [pregabalin]; spiriva handihaler [tiotropium bromide monohydrate]; and vraylar [cariprazine].  MEDICATIONS:  Current Outpatient Medications  Medication Sig Dispense Refill  . albuterol (PROAIR HFA) 108 (90 Base) MCG/ACT inhaler Inhale 2 puffs into the lungs every 6 (six) hours as needed for wheezing or shortness of breath. INHALE 2 PUFFS INTO THE LUNGS EVERY 6 HOURS AS NEEDED FOR WHEEZING OR SHORTNESS OF BREATH 3 Inhaler 3  . arformoterol (BROVANA) 15 MCG/2ML NEBU Take 2 mLs (15 mcg total) by nebulization 2 (two) times daily. 360 mL 3  . budesonide (PULMICORT) 0.5  MG/2ML nebulizer solution Take 2 mLs (0.5 mg total) by nebulization in the morning and at bedtime. 360 mL 3  . busPIRone (BUSPAR) 15 MG tablet Take 15 mg by mouth 2 (two) times daily.    . Cholecalciferol (VITAMIN D) 50 MCG (2000 UT) tablet Take 1 tablet (2,000 Units total) by mouth daily.    . Coenzyme Q-10 200 MG CAPS Take 200 mg by mouth daily.    . Cyanocobalamin (B-12) 5000 MCG CAPS Take 5,000 mcg by mouth daily.    . cyclobenzaprine (FLEXERIL) 10 MG tablet Take 1 tablet (10 mg total) by mouth at bedtime. 10 tablet 0  . esomeprazole (NEXIUM) 40 MG capsule Take 1 capsule (40 mg total) by mouth daily at 12 noon. 30 capsule 11  . ezetimibe (ZETIA) 10 MG tablet TAKE 1 TABLET BY MOUTH AT BEDTIME 90 tablet 2  . fexofenadine (ALLEGRA) 180 MG tablet Take 180 mg by mouth as needed.     Marland Kitchen FLUoxetine (PROZAC) 20 MG tablet Take 20 mg by mouth 2 (two) times daily.    . fluticasone (FLONASE) 50 MCG/ACT nasal spray USE 2 SPRAYS INTO THE NOSE EVERY 12 HOURS AS NEEDED FOR STUFFY NOSE 48 mL 2  . furosemide (LASIX) 20 MG tablet Take 1-2 tablets (20-40 mg total) by mouth daily as needed for fluid. 90 tablet 1  . glucose blood (ONETOUCH VERIO) test strip USE AS INSTRUCTED TO CHECK SUGAR 1 TIME DAILY 100 strip 8  . guaiFENesin (MUCINEX) 600 MG 12 hr tablet Take 2 tablets (1,200 mg total) by mouth 2 (two) times daily as needed for cough or to loosen phlegm.    . Iron, Ferrous Sulfate, 325 (65 Fe) MG TABS Take 325 mg by mouth daily. 90 tablet 3  . levothyroxine (SYNTHROID) 125 MCG tablet Take 1 tablet (125 mcg total) by mouth daily. 90 tablet 3  . metFORMIN (GLUCOPHAGE) 500 MG tablet Take 1 tablet (500 mg total) by mouth every evening. 90 tablet 3  . nitroGLYCERIN (NITROSTAT) 0.4 MG SL tablet Place 1 tablet (0.4 mg total) under the tongue every 5 (five) minutes as needed for chest pain. 25 tablet 3  . ONETOUCH DELICA LANCETS FINE MISC USE TO CHECK SUGAR 1 TIME DAILY 100 each 5  . rosuvastatin (CRESTOR) 10 MG tablet  TAKE 1 TABLET (10  MG TOTAL) BY MOUTH EVERY OTHER DAY. 45 tablet 2  . Spacer/Aero Chamber [employer] Use with  inhaler as needed.  J44.9 1 each 0   No current facility-administered medications for this visit.      PHYSICAL EXAMINATION:   Vitals:   09/23/21 1400  BP: (!) 120/56  Pulse: (!) 45  Resp: 16  Temp: 98.7 F (37.1 C)  SpO2: 96%   Filed Weights   09/23/21 1400  Weight: 217 lb (98.4 kg)    Physical Exam Vitals and nursing note reviewed.  HENT:     Head: Normocephalic and atraumatic.     Mouth/Throat:     Pharynx: Oropharynx is clear.  Eyes:     Extraocular Movements: Extraocular movements intact.     Pupils: Pupils are equal, round, and reactive to light.  Cardiovascular:     Rate and Rhythm: Normal rate and regular rhythm.  Pulmonary:     Comments: Decreased breath sounds bilaterally.  Scattered wheezing. Abdominal:     Palpations: Abdomen is soft.  Musculoskeletal:        General: Normal range of motion.     Cervical back: Normal range of motion.  Skin:    General: Skin is warm.  Neurological:     General: No focal deficit present.     Mental Status: She is alert and oriented to person, place, and time.  Psychiatric:        Behavior: Behavior normal.        Judgment: Judgment normal.    LABORATORY DATA:  I have reviewed the data as listed Lab Results  Component Value Date   WBC 5.5 09/23/2021   HGB 8.7 (L) 09/23/2021   HCT 29.9 (L) 09/23/2021   MCV 95.5 09/23/2021   PLT 176 09/23/2021   Recent Labs    12/16/20 1209 01/15/21 1143 03/25/21 1416 06/08/21 1547 07/24/21 1252 08/06/21 1620 09/23/21 1353  NA 140   < > 139 142 141 142 140  K 4.5   < > 4.3 4.7 4.8 4.4 4.2  CL 100   < > 100 103 102 104 104  CO2 33*   < > 35* 31 33* 33* 31  GLUCOSE 122*   < > 151* 91 141* 114* 171*  BUN 24*   < > 26* _0 24*  CREATININE 0.74   < > 0.89 0.80 0.81 0.82 0.80  CALCIUM 8.9   < > 8.5* 9.6 8.6* 9.3 8.3*  GFRNONAA >60   < > >60  --  >60   --  >60  PROT 6.5  --   --  6.9  --   --   --   ALBUMIN 3.4*  --   --  3.9  --   --   --   AST 14*  --   --  13  --   --   --   ALT 12  --   --  7  --   --   --   ALKPHOS 74  --   --  67  --   --   --   BILITOT 0.2*  --   --  0.4  --   --   --    < > = values in this interval not displayed.     No results found.  Symptomatic anemia  # Iron deficiency Anemia [OCT 2022- Ferritin-7]; Symptomatic -chronic intermittent [SINCE 2010]; s/p IV Venofer.  # Worsening anemia: Hemoglobin today - 8.7; improved  from 7.1 from May 2023.  Proceed with iron infusions x3  including 1 today.  # ETIOLOGY:  UNCLEAR- CT CT AP- JAN- 11th, 2023- Mild wall thickening of the proximal duodenum at least in part related to under distension, may reflect peptic ulcer disease/duodenitis.  Status post evaluation with Dr. Ardis Hughs in [place] 2023]-as per patient patient is not a candidate for any upper and lower endoscopies.  So monitor for now    # DISPOSITION: [wed pref] # Venofer today # venofer weekly x 3 ## follow up- 2 m- MD; cbc/bmp; possible-- venofer- Dr.B    All questions were answered. The patient knows to call the clinic with any problems, questions or concerns.    Cammie Sickle, MD 09/23/2021 3:33 PM

## 2021-09-23 NOTE — Progress Notes (Signed)
Patient reports worsening fatigue with excessive sleepiness.  BP check today 120/56, HR 45

## 2021-09-29 DIAGNOSIS — F411 Generalized anxiety disorder: Secondary | ICD-10-CM | POA: Diagnosis not present

## 2021-09-29 DIAGNOSIS — I5042 Chronic combined systolic (congestive) and diastolic (congestive) heart failure: Secondary | ICD-10-CM | POA: Diagnosis not present

## 2021-09-29 DIAGNOSIS — F32A Depression, unspecified: Secondary | ICD-10-CM | POA: Diagnosis not present

## 2021-09-29 DIAGNOSIS — E1136 Type 2 diabetes mellitus with diabetic cataract: Secondary | ICD-10-CM | POA: Diagnosis not present

## 2021-09-29 DIAGNOSIS — J449 Chronic obstructive pulmonary disease, unspecified: Secondary | ICD-10-CM | POA: Diagnosis not present

## 2021-09-29 DIAGNOSIS — I110 Hypertensive heart disease with heart failure: Secondary | ICD-10-CM | POA: Diagnosis not present

## 2021-09-30 ENCOUNTER — Inpatient Hospital Stay: Payer: Medicare Other

## 2021-09-30 VITALS — BP 153/72 | HR 78 | Temp 97.8°F | Resp 17

## 2021-09-30 DIAGNOSIS — Z85118 Personal history of other malignant neoplasm of bronchus and lung: Secondary | ICD-10-CM | POA: Diagnosis not present

## 2021-09-30 DIAGNOSIS — Z79899 Other long term (current) drug therapy: Secondary | ICD-10-CM | POA: Diagnosis not present

## 2021-09-30 DIAGNOSIS — D509 Iron deficiency anemia, unspecified: Secondary | ICD-10-CM | POA: Diagnosis not present

## 2021-09-30 DIAGNOSIS — Z87891 Personal history of nicotine dependence: Secondary | ICD-10-CM | POA: Diagnosis not present

## 2021-09-30 DIAGNOSIS — D649 Anemia, unspecified: Secondary | ICD-10-CM

## 2021-09-30 MED ORDER — SODIUM CHLORIDE 0.9 % IV SOLN
Freq: Once | INTRAVENOUS | Status: AC
  Filled 2021-09-30: qty 250

## 2021-09-30 MED ORDER — SODIUM CHLORIDE 0.9 % IV SOLN
200.0000 mg | Freq: Once | INTRAVENOUS | Status: AC
  Administered 2021-09-30: 200 mg via INTRAVENOUS
  Filled 2021-09-30: qty 200

## 2021-10-05 ENCOUNTER — Ambulatory Visit: Payer: Medicare Other | Admitting: Internal Medicine

## 2021-10-06 MED FILL — Iron Sucrose Inj 20 MG/ML (Fe Equiv): INTRAVENOUS | Qty: 10 | Status: AC

## 2021-10-07 ENCOUNTER — Inpatient Hospital Stay: Payer: Medicare Other | Attending: Internal Medicine

## 2021-10-07 VITALS — BP 147/48 | HR 73 | Temp 98.0°F

## 2021-10-07 DIAGNOSIS — D509 Iron deficiency anemia, unspecified: Secondary | ICD-10-CM | POA: Diagnosis not present

## 2021-10-07 DIAGNOSIS — Z79899 Other long term (current) drug therapy: Secondary | ICD-10-CM | POA: Diagnosis not present

## 2021-10-07 DIAGNOSIS — Z87891 Personal history of nicotine dependence: Secondary | ICD-10-CM | POA: Insufficient documentation

## 2021-10-07 DIAGNOSIS — Z85118 Personal history of other malignant neoplasm of bronchus and lung: Secondary | ICD-10-CM | POA: Diagnosis not present

## 2021-10-07 DIAGNOSIS — D649 Anemia, unspecified: Secondary | ICD-10-CM

## 2021-10-07 MED ORDER — SODIUM CHLORIDE 0.9 % IV SOLN
200.0000 mg | Freq: Once | INTRAVENOUS | Status: AC
  Administered 2021-10-07: 200 mg via INTRAVENOUS
  Filled 2021-10-07: qty 200

## 2021-10-07 MED ORDER — SODIUM CHLORIDE 0.9 % IV SOLN
Freq: Once | INTRAVENOUS | Status: AC
  Filled 2021-10-07: qty 250

## 2021-10-07 NOTE — Patient Instructions (Signed)

## 2021-10-09 ENCOUNTER — Telehealth: Payer: Self-pay | Admitting: *Deleted

## 2021-10-09 NOTE — Telephone Encounter (Signed)
Returned patient's daughter's phone call, spoke with Geronimo Running

## 2021-10-13 ENCOUNTER — Ambulatory Visit: Payer: Self-pay | Admitting: Radiation Oncology

## 2021-10-13 ENCOUNTER — Encounter: Payer: Self-pay | Admitting: Radiation Oncology

## 2021-10-13 DIAGNOSIS — E1136 Type 2 diabetes mellitus with diabetic cataract: Secondary | ICD-10-CM | POA: Diagnosis not present

## 2021-10-13 DIAGNOSIS — I110 Hypertensive heart disease with heart failure: Secondary | ICD-10-CM | POA: Diagnosis not present

## 2021-10-13 DIAGNOSIS — J449 Chronic obstructive pulmonary disease, unspecified: Secondary | ICD-10-CM | POA: Diagnosis not present

## 2021-10-13 DIAGNOSIS — F411 Generalized anxiety disorder: Secondary | ICD-10-CM | POA: Diagnosis not present

## 2021-10-13 DIAGNOSIS — I5042 Chronic combined systolic (congestive) and diastolic (congestive) heart failure: Secondary | ICD-10-CM | POA: Diagnosis not present

## 2021-10-13 DIAGNOSIS — F32A Depression, unspecified: Secondary | ICD-10-CM | POA: Diagnosis not present

## 2021-10-13 MED FILL — Iron Sucrose Inj 20 MG/ML (Fe Equiv): INTRAVENOUS | Qty: 10 | Status: AC

## 2021-10-13 NOTE — Progress Notes (Signed)
Telephone appointment. I verified patient's identity and began nursing interview. Patient reports her usual SOB due to her COPD level 3 but otherwise is doing well. No other issues reported at this time.  Meaningful use complete.  Reminded patient of her 1:30pm-10/19/21 telephone appointment w/ Shona Simpson PA-C. I left my extension [phone] in case patient needs anything. Patient verbalized understanding.  Patient contact [phone]

## 2021-10-14 ENCOUNTER — Inpatient Hospital Stay: Payer: Medicare Other

## 2021-10-14 VITALS — BP 142/48 | HR 84 | Temp 99.4°F | Resp 22

## 2021-10-14 DIAGNOSIS — Z85118 Personal history of other malignant neoplasm of bronchus and lung: Secondary | ICD-10-CM | POA: Diagnosis not present

## 2021-10-14 DIAGNOSIS — Z79899 Other long term (current) drug therapy: Secondary | ICD-10-CM | POA: Diagnosis not present

## 2021-10-14 DIAGNOSIS — D509 Iron deficiency anemia, unspecified: Secondary | ICD-10-CM | POA: Diagnosis not present

## 2021-10-14 DIAGNOSIS — D649 Anemia, unspecified: Secondary | ICD-10-CM

## 2021-10-14 DIAGNOSIS — Z87891 Personal history of nicotine dependence: Secondary | ICD-10-CM | POA: Diagnosis not present

## 2021-10-14 MED ORDER — SODIUM CHLORIDE 0.9 % IV SOLN
Freq: Once | INTRAVENOUS | Status: AC
  Filled 2021-10-14: qty 250

## 2021-10-14 MED ORDER — METHYLPREDNISOLONE SODIUM SUCC 125 MG IJ SOLR
40.0000 mg | Freq: Once | INTRAMUSCULAR | Status: AC
  Administered 2021-10-14: 40 mg via INTRAVENOUS

## 2021-10-14 MED ORDER — SODIUM CHLORIDE 0.9 % IV SOLN
200.0000 mg | Freq: Once | INTRAVENOUS | Status: AC
  Administered 2021-10-14: 200 mg via INTRAVENOUS
  Filled 2021-10-14: qty 200

## 2021-10-14 NOTE — Patient Instructions (Signed)

## 2021-10-16 ENCOUNTER — Ambulatory Visit (INDEPENDENT_AMBULATORY_CARE_PROVIDER_SITE_OTHER): Payer: Medicare Other | Admitting: Nurse Practitioner

## 2021-10-16 ENCOUNTER — Encounter: Payer: Self-pay | Admitting: Nurse Practitioner

## 2021-10-16 ENCOUNTER — Ambulatory Visit (HOSPITAL_COMMUNITY): Payer: Medicare Other

## 2021-10-16 VITALS — BP 110/42 | HR 78 | Temp 98.4°F | Ht 65.0 in | Wt 217.0 lb

## 2021-10-16 DIAGNOSIS — N3001 Acute cystitis with hematuria: Secondary | ICD-10-CM | POA: Diagnosis not present

## 2021-10-16 LAB — POCT URINALYSIS DIPSTICK OB
Bilirubin, UA: NEGATIVE
Glucose, UA: NEGATIVE
Ketones, UA: NEGATIVE
Nitrite, UA: NEGATIVE
Spec Grav, UA: 1.015 (ref 1.010–1.025)
Urobilinogen, UA: 0.2 E.U./dL
pH, UA: 5.5 (ref 5.0–8.0)

## 2021-10-16 MED ORDER — AMOXICILLIN-POT CLAVULANATE 875-125 MG PO TABS: 1.0000 | ORAL_TABLET | Freq: Two times a day (BID) | ORAL | 0 refills | Status: DC

## 2021-10-16 NOTE — Patient Instructions (Addendum)
Maintain adequate oral hydration Start probiotics (curturelle or florastor) 1cap at noon Start augmentin Call office if no improvement in 3days

## 2021-10-16 NOTE — Progress Notes (Signed)
Established Patient Visit  Patient: Alexandria [name]   DOB: 07-19-1935   86 y.o. Female  MRN: [id number] Visit Date: 10/16/2021  Subjective:    Chief Complaint  Patient presents with   Acute Visit    Possible UTI C/o of abd, arms & legs pain all night Frequent urge to urinate & diarrhea    Urinary Tract Infection  This is a new problem. The current episode started in the past 7 days. The problem occurs every urination. The problem has been gradually worsening. The quality of the pain is described as burning. There has been no fever. She is Not sexually active. There is No history of pyelonephritis. Associated symptoms include chills, frequency and urgency. Pertinent negatives include no discharge, flank pain, hematuria, hesitancy, nausea, possible pregnancy, sweats or vomiting. She has tried nothing for the symptoms. There is no history of urinary stasis.   Reviewed medical, surgical, and social history today  Medications: Outpatient Medications Prior to Visit  Medication Sig   albuterol (PROAIR HFA) 108 (90 Base) MCG/ACT inhaler Inhale 2 puffs into the lungs every 6 (six) hours as needed for wheezing or shortness of breath. INHALE 2 PUFFS INTO THE LUNGS EVERY 6 HOURS AS NEEDED FOR WHEEZING OR SHORTNESS OF BREATH   arformoterol (BROVANA) 15 MCG/2ML NEBU Take 2 mLs (15 mcg total) by nebulization 2 (two) times daily.   budesonide (PULMICORT) 0.5 MG/2ML nebulizer solution Take 2 mLs (0.5 mg total) by nebulization in the morning and at bedtime.   busPIRone (BUSPAR) 15 MG tablet Take 15 mg by mouth 2 (two) times daily.   Cholecalciferol (VITAMIN D) 50 MCG (2000 UT) tablet Take 1 tablet (2,000 Units total) by mouth daily.   Coenzyme Q-10 200 MG CAPS Take 200 mg by mouth daily.   Cyanocobalamin (B-12) 5000 MCG CAPS Take 5,000 mcg by mouth daily.   cyclobenzaprine (FLEXERIL) 10 MG tablet Take 1 tablet (10 mg total) by mouth at bedtime.   esomeprazole (NEXIUM) 40 MG capsule Take 1  capsule (40 mg total) by mouth daily at 12 noon.   ezetimibe (ZETIA) 10 MG tablet TAKE 1 TABLET BY MOUTH AT BEDTIME   fexofenadine (ALLEGRA) 180 MG tablet Take 180 mg by mouth as needed.    FLUoxetine (PROZAC) 20 MG tablet Take 20 mg by mouth 2 (two) times daily.   fluticasone (FLONASE) 50 MCG/ACT nasal spray USE 2 SPRAYS INTO THE NOSE EVERY 12 HOURS AS NEEDED FOR STUFFY NOSE   furosemide (LASIX) 20 MG tablet Take 1-2 tablets (20-40 mg total) by mouth daily as needed for fluid.   glucose blood (ONETOUCH VERIO) test strip USE AS INSTRUCTED TO CHECK SUGAR 1 TIME DAILY   guaiFENesin (MUCINEX) 600 MG 12 hr tablet Take 2 tablets (1,200 mg total) by mouth 2 (two) times daily as needed for cough or to loosen phlegm.   Iron, Ferrous Sulfate, 325 (65 Fe) MG TABS Take 325 mg by mouth daily.   levothyroxine (SYNTHROID) 125 MCG tablet Take 1 tablet (125 mcg total) by mouth daily.   metFORMIN (GLUCOPHAGE) 500 MG tablet Take 1 tablet (500 mg total) by mouth every evening.   nitroGLYCERIN (NITROSTAT) 0.4 MG SL tablet Place 1 tablet (0.4 mg total) under the tongue every 5 (five) minutes as needed for chest pain.   ONETOUCH DELICA LANCETS FINE MISC USE TO CHECK SUGAR 1 TIME DAILY   rosuvastatin (CRESTOR) 10 MG tablet TAKE 1 TABLET (  10 MG TOTAL) BY MOUTH EVERY OTHER DAY.   Spacer/Aero Chamber [employer] Use with  inhaler as needed.  J44.9   No facility-administered medications prior to visit.   Reviewed past medical and social history.   ROS per HPI above      Objective:  BP (!) 110/42 (BP Location: Right Arm, Patient Position: Sitting, Cuff Size: Normal)   Pulse 78   Temp 98.4 F (36.9 C) (Temporal)   Ht 5\' 5"  (1.651 m)   Wt 217 lb (98.4 kg)   LMP  (LMP Unknown)   SpO2 95%   BMI 36.11 kg/m      Physical Exam Constitutional:      General: She is not in acute distress. Pulmonary:     Effort: Pulmonary effort is normal.  Abdominal:     General: There is no distension.     Palpations:  Abdomen is soft.     Tenderness: There is no abdominal tenderness. There is no guarding.  Neurological:     Mental Status: She is alert and oriented to person, place, and time.     Results for orders placed or performed in visit on 10/16/21  POC Urinalysis Dipstick OB  Result Value Ref Range   Color, UA Yellow    Clarity, UA Cloudy    Glucose, UA Negative Negative   Bilirubin, UA Negative    Ketones, UA Negative    Spec Grav, UA 1.015 1.010 - 1.025   Blood, UA 3+    pH, UA 5.5 5.0 - 8.0   POC,PROTEIN,UA Trace Negative, Trace, Small (1+), Moderate (2+), Large (3+), 4+   Urobilinogen, UA 0.2 0.2 or 1.0 E.U./dL   Nitrite, UA Negative    Leukocytes, UA Large (3+) (A) Negative   Appearance Cloudy    Odor some       Assessment & Plan:    Problem List Items Addressed This Visit       Genitourinary   Acute cystitis - Primary   Relevant Medications   amoxicillin-clavulanate (AUGMENTIN) 875-125 MG tablet   Other Relevant Orders   POC Urinalysis Dipstick OB (Completed)   Urine Culture  Maintain adequate oral hydration Start probiotics (curturelle or florastor) 1cap at noon Start augmentin Call office if no improvement in 3days.  No follow-ups on file.     Wilfred Lacy, NP

## 2021-10-18 LAB — URINE CULTURE
MICRO NUMBER:: 13767997
SPECIMEN QUALITY:: ADEQUATE

## 2021-10-19 ENCOUNTER — Ambulatory Visit
Admission: RE | Admit: 2021-10-19 | Discharge: 2021-10-19 | Disposition: A | Discharge status: Home / Self Care | Payer: Medicare Other | Source: Ambulatory Visit | Attending: Radiation Oncology | Admitting: Radiation Oncology

## 2021-10-19 DIAGNOSIS — E669 Obesity, unspecified: Secondary | ICD-10-CM | POA: Diagnosis not present

## 2021-10-19 DIAGNOSIS — Z87440 Personal history of urinary (tract) infections: Secondary | ICD-10-CM | POA: Diagnosis not present

## 2021-10-19 DIAGNOSIS — R32 Unspecified urinary incontinence: Secondary | ICD-10-CM | POA: Diagnosis not present

## 2021-10-19 DIAGNOSIS — Z87891 Personal history of nicotine dependence: Secondary | ICD-10-CM | POA: Diagnosis not present

## 2021-10-19 DIAGNOSIS — J449 Chronic obstructive pulmonary disease, unspecified: Secondary | ICD-10-CM | POA: Diagnosis not present

## 2021-10-19 DIAGNOSIS — G4733 Obstructive sleep apnea (adult) (pediatric): Secondary | ICD-10-CM | POA: Diagnosis not present

## 2021-10-19 DIAGNOSIS — Z9181 History of falling: Secondary | ICD-10-CM | POA: Diagnosis not present

## 2021-10-19 DIAGNOSIS — Z90710 Acquired absence of both cervix and uterus: Secondary | ICD-10-CM | POA: Diagnosis not present

## 2021-10-19 DIAGNOSIS — Z7984 Long term (current) use of oral hypoglycemic drugs: Secondary | ICD-10-CM | POA: Diagnosis not present

## 2021-10-19 DIAGNOSIS — F411 Generalized anxiety disorder: Secondary | ICD-10-CM | POA: Diagnosis not present

## 2021-10-19 DIAGNOSIS — Z6835 Body mass index (BMI) 35.0-35.9, adult: Secondary | ICD-10-CM | POA: Diagnosis not present

## 2021-10-19 DIAGNOSIS — K219 Gastro-esophageal reflux disease without esophagitis: Secondary | ICD-10-CM | POA: Diagnosis not present

## 2021-10-19 DIAGNOSIS — E1136 Type 2 diabetes mellitus with diabetic cataract: Secondary | ICD-10-CM | POA: Diagnosis not present

## 2021-10-19 DIAGNOSIS — E038 Other specified hypothyroidism: Secondary | ICD-10-CM | POA: Diagnosis not present

## 2021-10-19 DIAGNOSIS — Z7951 Long term (current) use of inhaled steroids: Secondary | ICD-10-CM | POA: Diagnosis not present

## 2021-10-19 DIAGNOSIS — I110 Hypertensive heart disease with heart failure: Secondary | ICD-10-CM | POA: Diagnosis not present

## 2021-10-19 DIAGNOSIS — M7550 Bursitis of unspecified shoulder: Secondary | ICD-10-CM | POA: Diagnosis not present

## 2021-10-19 DIAGNOSIS — Z79899 Other long term (current) drug therapy: Secondary | ICD-10-CM | POA: Diagnosis not present

## 2021-10-19 DIAGNOSIS — F32A Depression, unspecified: Secondary | ICD-10-CM | POA: Diagnosis not present

## 2021-10-19 DIAGNOSIS — D509 Iron deficiency anemia, unspecified: Secondary | ICD-10-CM | POA: Diagnosis not present

## 2021-10-19 DIAGNOSIS — E782 Mixed hyperlipidemia: Secondary | ICD-10-CM | POA: Diagnosis not present

## 2021-10-19 DIAGNOSIS — E1142 Type 2 diabetes mellitus with diabetic polyneuropathy: Secondary | ICD-10-CM | POA: Diagnosis not present

## 2021-10-19 DIAGNOSIS — Z9049 Acquired absence of other specified parts of digestive tract: Secondary | ICD-10-CM | POA: Diagnosis not present

## 2021-10-19 DIAGNOSIS — I5042 Chronic combined systolic (congestive) and diastolic (congestive) heart failure: Secondary | ICD-10-CM | POA: Diagnosis not present

## 2021-10-19 DIAGNOSIS — Z85118 Personal history of other malignant neoplasm of bronchus and lung: Secondary | ICD-10-CM | POA: Diagnosis not present

## 2021-10-20 ENCOUNTER — Telehealth: Payer: Self-pay | Admitting: *Deleted

## 2021-10-20 NOTE — Telephone Encounter (Signed)
CALLED PATIENT'S DAUGHTER- [name] LEBOLD TO INFORM OF CT FOR 11-13-21- ARRIVAL TIME- 2:15 PM @ [facility], PATIENT TO HAVE WATER ONLY- 4 HRS. PRIOR TO TEST, LABS TO BE DRAWN I-STAT IN RADIOLOGY,  PATIENT TO RECEIVE RESULTS FROM ALISON PERKINS ON 11-16-21  @ 2:30 PM VIA TELEPHONE, SPOKE WITH PATIENT'S DAUGHTER - [name] LEBOLD AND SHE IS AWARE OF THESE APPTS. AND THE INSTRUCTIONS

## 2021-10-21 ENCOUNTER — Telehealth: Payer: Self-pay | Admitting: *Deleted

## 2021-10-21 NOTE — Telephone Encounter (Signed)
Called patient's daughterGeronimo Running to inform that her mom's scan has been moved to 11-12-21- arrival time- 2 pm @ [facility], patient to have labs drawn I- Stat in Radiology, patient to have water only- 4 hrs. prior to test, patient to receive results from Shona Simpson on 11-16-21 @ 2:30 pm via telephone, spoke with Geronimo Running (patient's daughter) and she is aware of this scan and the instructions and the telephone fu

## 2021-10-27 ENCOUNTER — Ambulatory Visit: Payer: Medicare Other | Admitting: Pulmonary Disease

## 2021-10-27 DIAGNOSIS — F411 Generalized anxiety disorder: Secondary | ICD-10-CM | POA: Diagnosis not present

## 2021-10-27 DIAGNOSIS — F32A Depression, unspecified: Secondary | ICD-10-CM | POA: Diagnosis not present

## 2021-10-27 DIAGNOSIS — E1136 Type 2 diabetes mellitus with diabetic cataract: Secondary | ICD-10-CM | POA: Diagnosis not present

## 2021-10-27 DIAGNOSIS — J449 Chronic obstructive pulmonary disease, unspecified: Secondary | ICD-10-CM | POA: Diagnosis not present

## 2021-10-27 DIAGNOSIS — I5042 Chronic combined systolic (congestive) and diastolic (congestive) heart failure: Secondary | ICD-10-CM | POA: Diagnosis not present

## 2021-10-27 DIAGNOSIS — I110 Hypertensive heart disease with heart failure: Secondary | ICD-10-CM | POA: Diagnosis not present

## 2021-11-10 ENCOUNTER — Telehealth: Payer: Self-pay | Admitting: *Deleted

## 2021-11-10 ENCOUNTER — Encounter: Payer: Self-pay | Admitting: Family Medicine

## 2021-11-10 ENCOUNTER — Encounter: Payer: Self-pay | Admitting: Pulmonary Disease

## 2021-11-10 DIAGNOSIS — J449 Chronic obstructive pulmonary disease, unspecified: Secondary | ICD-10-CM

## 2021-11-10 MED ORDER — ROSUVASTATIN CALCIUM 10 MG PO TABS: 10.0000 mg | ORAL_TABLET | ORAL | 2 refills | Status: DC

## 2021-11-10 NOTE — Addendum Note (Signed)
Addended by: Sherrilee Gilles B on: 11/10/2021 02:39 PM   Modules accepted: Orders

## 2021-11-10 NOTE — Telephone Encounter (Signed)
Returned patient's daughter's phone call, spoke with patient's daughter Geronimo Running)

## 2021-11-11 DIAGNOSIS — J449 Chronic obstructive pulmonary disease, unspecified: Secondary | ICD-10-CM | POA: Diagnosis not present

## 2021-11-11 DIAGNOSIS — F411 Generalized anxiety disorder: Secondary | ICD-10-CM | POA: Diagnosis not present

## 2021-11-11 DIAGNOSIS — I110 Hypertensive heart disease with heart failure: Secondary | ICD-10-CM | POA: Diagnosis not present

## 2021-11-11 DIAGNOSIS — I5042 Chronic combined systolic (congestive) and diastolic (congestive) heart failure: Secondary | ICD-10-CM | POA: Diagnosis not present

## 2021-11-11 DIAGNOSIS — F32A Depression, unspecified: Secondary | ICD-10-CM | POA: Diagnosis not present

## 2021-11-11 DIAGNOSIS — E1136 Type 2 diabetes mellitus with diabetic cataract: Secondary | ICD-10-CM | POA: Diagnosis not present

## 2021-11-12 ENCOUNTER — Encounter (HOSPITAL_COMMUNITY): Payer: Self-pay

## 2021-11-12 ENCOUNTER — Ambulatory Visit (HOSPITAL_COMMUNITY)
Admission: RE | Admit: 2021-11-12 | Discharge: 2021-11-12 | Disposition: A | Discharge status: Home / Self Care | Payer: Medicare Other | Source: Ambulatory Visit | Attending: Radiation Oncology | Admitting: Radiation Oncology

## 2021-11-12 ENCOUNTER — Ambulatory Visit (INDEPENDENT_AMBULATORY_CARE_PROVIDER_SITE_OTHER): Payer: Medicare Other | Admitting: Nurse Practitioner

## 2021-11-12 ENCOUNTER — Encounter: Payer: Self-pay | Admitting: Radiation Oncology

## 2021-11-12 VITALS — BP 150/62 | HR 76 | Temp 97.4°F | Resp 20 | Wt 218.0 lb

## 2021-11-12 DIAGNOSIS — J90 Pleural effusion, not elsewhere classified: Secondary | ICD-10-CM | POA: Diagnosis not present

## 2021-11-12 DIAGNOSIS — R300 Dysuria: Secondary | ICD-10-CM

## 2021-11-12 DIAGNOSIS — C3411 Malignant neoplasm of upper lobe, right bronchus or lung: Secondary | ICD-10-CM | POA: Insufficient documentation

## 2021-11-12 DIAGNOSIS — E1140 Type 2 diabetes mellitus with diabetic neuropathy, unspecified: Secondary | ICD-10-CM | POA: Diagnosis not present

## 2021-11-12 DIAGNOSIS — C3490 Malignant neoplasm of unspecified part of unspecified bronchus or lung: Secondary | ICD-10-CM | POA: Diagnosis not present

## 2021-11-12 DIAGNOSIS — N3090 Cystitis, unspecified without hematuria: Secondary | ICD-10-CM | POA: Diagnosis not present

## 2021-11-12 DIAGNOSIS — R918 Other nonspecific abnormal finding of lung field: Secondary | ICD-10-CM | POA: Diagnosis not present

## 2021-11-12 LAB — POC URINALSYSI DIPSTICK (AUTOMATED)
Bilirubin, UA: NEGATIVE
Blood, UA: POSITIVE
Glucose, UA: NEGATIVE
Ketones, UA: NEGATIVE
Nitrite, UA: NEGATIVE
Protein, UA: POSITIVE — AB
Spec Grav, UA: 1.015 (ref 1.010–1.025)
Urobilinogen, UA: 0.2 E.U./dL
pH, UA: 6 (ref 5.0–8.0)

## 2021-11-12 LAB — POCT I-STAT CREATININE: Creatinine, Ser: 0.8 mg/dL (ref 0.44–1.00)

## 2021-11-12 MED ORDER — IOHEXOL 300 MG/ML  SOLN
75.0000 mL | Freq: Once | INTRAMUSCULAR | Status: AC | PRN
Start: 2021-11-12 — End: 2021-11-12
  Administered 2021-11-12: 75 mL via INTRAVENOUS

## 2021-11-12 MED ORDER — CEPHALEXIN 500 MG PO CAPS: 500.0000 mg | ORAL_CAPSULE | Freq: Two times a day (BID) | ORAL | 0 refills | Status: DC

## 2021-11-12 MED ORDER — SODIUM CHLORIDE (PF) 0.9 % IJ SOLN
INTRAMUSCULAR | Status: AC
  Filled 2021-11-12: qty 50

## 2021-11-12 NOTE — Progress Notes (Addendum)
Telephone appointment. I verified patient's identity and began nursing interview. Patient states "Having usual SOB w/ exertion." No other issues reported at this time.  Meaningful use complete.  Reminded patient of her 2:30pm-11/16/21 telephone appointment w/ Shona Simpson PA-C. I left my extension [phone] in case patient needs anything. Patient verbalized understanding.  Patient contact [phone]- Adah Salvage (daughter)

## 2021-11-12 NOTE — Assessment & Plan Note (Signed)
Indicative of UTI.  Pending urine culture

## 2021-11-12 NOTE — Progress Notes (Signed)
Acute Office Visit  Subjective:     Patient ID: Alexandria [name], female    DOB: 03/12/1935, 86 y.o.   MRN: [id number]  Chief Complaint  Patient presents with   Urinary Tract Infection    Sx started around 11/09/21-Burning with urination, burning and pain radiates through arms and legs.      Patient is in today for   States that it started 3 days ago.  Patient endorses dysuria, urgency, frequency.  She denies fever, chills, back pain, nausea, vomiting, or abdominal pain. Patient was recently seen approximately 1 month ago by a colleague and diagnosed with urinary tract infection and placed on Augmentin.  Review of Systems  Constitutional:  Negative for chills and fever.  Gastrointestinal:  Negative for abdominal pain, nausea and vomiting.  Genitourinary:  Positive for dysuria, frequency and urgency. Negative for hematuria.  Musculoskeletal:  Negative for back pain.        Objective:    BP (!) 150/62   Pulse 76   Temp (!) 97.4 F (36.3 C)   Resp 20   Wt 218 lb (98.9 kg) Comment: per patient  LMP  (LMP Unknown)   SpO2 95%   BMI 36.28 kg/m    Physical Exam Vitals and nursing note reviewed.  Constitutional:      Appearance: Normal appearance. She is obese.  Cardiovascular:     Rate and Rhythm: Normal rate and regular rhythm.     Heart sounds: Normal heart sounds.  Pulmonary:     Effort: Pulmonary effort is normal.     Comments: Decreased breath sounds globally Abdominal:     General: Bowel sounds are normal. There is no distension.     Palpations: There is no mass.     Tenderness: There is no abdominal tenderness. There is no right CVA tenderness or left CVA tenderness.     Hernia: No hernia is present.  Neurological:     Mental Status: She is alert.     Results for orders placed or performed in visit on 11/12/21  POCT Urinalysis Dipstick (Automated)  Result Value Ref Range   Color, UA yellow    Clarity, UA cloudy    Glucose, UA Negative Negative    Bilirubin, UA Negative    Ketones, UA Negative    Spec Grav, UA 1.015 1.010 - 1.025   Blood, UA Positive    pH, UA 6.0 5.0 - 8.0   Protein, UA Positive (A) Negative   Urobilinogen, UA 0.2 0.2 or 1.0 E.U./dL   Nitrite, UA Negative    Leukocytes, UA Large (3+) (A) Negative  Results for orders placed or performed during the hospital encounter of 11/12/21  I-STAT creatinine  Result Value Ref Range   Creatinine, Ser 0.80 0.44 - 1.00 mg/dL        Assessment & Plan:   Problem List Items Addressed This Visit       Genitourinary   Cystitis    UA indicative of urinary tract infection.  We will send off urine culture, pending.  We will treat with Keflex 500 mg twice daily for 7 days.  Follow-up if no improvement      Relevant Medications   cephALEXin (KEFLEX) 500 MG capsule     Other   Dysuria - Primary    Indicative of UTI.  Pending urine culture      Relevant Orders   POCT Urinalysis Dipstick (Automated) (Completed)   Urine Culture    Meds ordered this encounter  Medications  cephALEXin (KEFLEX) 500 MG capsule    Sig: Take 1 capsule (500 mg total) by mouth 2 (two) times daily.    Dispense:  14 capsule    Refill:  0    Order Specific Question:   Supervising Provider    Answer:   TOWER, MARNE A [[id number]    Return if symptoms worsen or fail to improve.  Romilda Garret, NP

## 2021-11-12 NOTE — Patient Instructions (Signed)
Nice to see you today I have sent the antibiotic to the pharmacy I will be in touch once I have the culture results Follow up if no improvement

## 2021-11-12 NOTE — Assessment & Plan Note (Signed)
UA indicative of urinary tract infection.  We will send off urine culture, pending.  We will treat with Keflex 500 mg twice daily for 7 days.  Follow-up if no improvement

## 2021-11-13 ENCOUNTER — Other Ambulatory Visit (HOSPITAL_COMMUNITY): Payer: Medicare Other

## 2021-11-14 LAB — URINE CULTURE
MICRO NUMBER:: 13885505
SPECIMEN QUALITY:: ADEQUATE

## 2021-11-16 ENCOUNTER — Ambulatory Visit
Admission: RE | Admit: 2021-11-16 | Discharge: 2021-11-16 | Disposition: A | Discharge status: Home / Self Care | Payer: Medicare Other | Source: Ambulatory Visit | Attending: Radiation Oncology | Admitting: Radiation Oncology

## 2021-11-16 DIAGNOSIS — C3411 Malignant neoplasm of upper lobe, right bronchus or lung: Secondary | ICD-10-CM

## 2021-11-16 DIAGNOSIS — Z87891 Personal history of nicotine dependence: Secondary | ICD-10-CM | POA: Diagnosis not present

## 2021-11-16 NOTE — Progress Notes (Addendum)
Radiation Oncology         [phone] ________________________________   Name: Alexandria [name]        MRN: [id number]  Date of Service: 11/16/2021 DOB: 01-11-1936  HC:WCBJSE, Elveria Rising, MD  Garner Nash, DO     REFERRING PHYSICIAN: Garner Nash, DO   DIAGNOSIS: The encounter diagnosis was Malignant neoplasm of right upper lobe of lung ([place]).   HISTORY OF PRESENT ILLNESS: Alexandria [name] is a 86 y.o. female with a history of putative Stage IA3, cT1cN0M0, NSCLC of the RUL. The patient has a history of Oxygen Dependant COPD at 2-3 L [place] several years.  She had been followed as well with nodules in both lungs since about 2017. CT imaging of the chest on 06/13/19 revealed an increase in size in a dominant lesion in the apical RUL. This measured 2.2 x 1.2 cm, she had other stable nodules. PET imaging on 07/18/19 did reveal increased metabolic activity in the RUL lesion with an SUV of 2.2 despite blood pool of 3.32. She was counseled on the option of bronchoscopy for diagnosis of the presumed early stage lung cancer but was not interested in having any invasive procedures, or surgery. As a result, she proceeded with stereotactic body radiotherapy (SBRT) which she completed in August 2021.   The patient is continued to be without residual disease. She's had scans that have shown  evolving changes in the postradiation fibrosis of the apex of the right upper lobe. Persistent atherosclerotic disease as well as calcifications of the aortic valve and mitral annulus continue to be noted. She also has chronic changes in the liver consistent with cavernous hemangioma as well as stable changes in the lateral aspect of the left seventh rib which radiology feels likely represents a bone island.    Her last scan in May 2023 showed changes of a right pleural effusion and nodularity at the right lung base and Dr. Lisbeth Renshaw recommended repeat imaging at 3 months. Her scan was scheduled but had to be rescheduled which  she went for on 11/12/21 which showed resolution of the right effusion and pleural nodularity. Similar findings of her rib, liver, atherosclerosis, calcifications of heart valves persist. She had new nodular changes of the RUL measuring up to 2.2 cm, as well as two stable nodules in the RLL each measuring 3 mm, and stable LUL nodule measuring 3 mm. There is a new nodule in the peripheral LUL also measuring 3 mm. There is also a new fracture along the right posterior 3rd rib. She's contacted by phone today to discuss these results.    PREVIOUS RADIATION THERAPY:    10/11/19 - 10/18/19 SBRT: The patient was treated to the right upper lobe lung with a course of stereotactic body radiation treatment.  The patient received 54 Gray in 3 fractions using a IMRT/SBRT technique, with 3 fields.  PAST MEDICAL HISTORY:  Past Medical History:  Diagnosis Date   Allergy, unspecified not elsewhere classified    Anxiety    Cataract    Chronic diastolic congestive heart failure (HCC)    COPD (chronic obstructive pulmonary disease) ([facility]) 03/08/1998   PFTs 12/12/2002 FEV 1 1.42 (64%) ratio 58 with no better after B2 and DLCO75%; PFTs 11/20/09 FEV1 1.50 (73%) ratio 50 no better after B2 with DLCO 62%; Hfa 50% 11/20/2009 >75%, 01/13/10 p coaching   Depression 12/07/1998   Diabetes mellitus type II 02/05/2006   Dr. Cruzita Lederer with endo   Disorders of bursae and tendons  in shoulder region, unspecified    Rotator cuff syndrome, right   E. coli bacteremia    Esophagitis    GERD (gastroesophageal reflux disease)    History of UTI    Hyperlipidemia 10/04/2000   Hypertension 03/08/1992   Hypothyroidism 03/08/1968   Iron deficiency anemia    Lung nodule    radiation starts 10-08-2019   Malignant neoplasm of right upper lobe of lung ([place]) 07/24/2019   Obesity    NOS   OSA (obstructive sleep apnea)    PSG 01/27/10 AHI 13, pt does not know CPAP settings   Peripheral neuropathy    Likely due to DM per Dr. Murvin Natal hernia         PAST SURGICAL HISTORY: Past Surgical History:  Procedure Laterality Date   ABD U/S  03/19/1999   Nml x2 foci in liver   ADENOSINE MYOVIEW  06/02/2007   Nml   CARDIOLITE PERSANTINE  08/24/2000   Nml   CAROTID U/S  08/24/2000   1-39% ICA stenosis   CAROTID U/S  06/02/2007   No apprec change    CARPAL TUNNEL RELEASE  12/1997   Right   CESAREAN SECTION     x2 [place]ree[place]/ repeat   CHOLECYSTECTOMY  1997   COLONOSCOPY WITH PROPOFOL N/A 02/12/2016   Procedure: COLONOSCOPY WITH PROPOFOL;  Surgeon: Milus Banister, MD;  Location: [facility];  Service: Endoscopy;  Laterality: N/A;   CT ABD W & PELVIS WO/W CM     Abd hemangiomas of liver, 1 cm R renal cyst   DENTAL SURGERY  2016   Implants   DEXA  07/03/2003   Nml   ESOPHAGOGASTRODUODENOSCOPY  12/05/1997   Nml (due to hoarseness)   ESOPHAGOGASTRODUODENOSCOPY (EGD) WITH PROPOFOL N/A 02/12/2016   Procedure: ESOPHAGOGASTRODUODENOSCOPY (EGD) WITH PROPOFOL;  Surgeon: Milus Banister, MD;  Location: [facility];  Service: Endoscopy;  Laterality: N/A;   GALLBLADDER SURGERY     HERNIA REPAIR  01/24/2009   Lap Ventr w/ Lysis of adhesions (Dr. Donne Hazel)   knee arthroscopic surgery  years ago   right   ROTATOR CUFF REPAIR  1984   Right, [place]   SHOULDER OPEN ROTATOR CUFF REPAIR  02/08/2012   Procedure: ROTATOR CUFF REPAIR SHOULDER OPEN;  Surgeon: Magnus Sinning, MD;  Location: [facility];  Service: Orthopedics;  Laterality: Left;  Left Shoulder Open Anterior Acrominectomy Rotator Cuff Repair Open Distal Clavicle Resection ,tissue mend graft, and repair of biceps tendon   THUMB RELEASE  12/1997   Right   TONSILLECTOMY     TOTAL ABDOMINAL HYSTERECTOMY  1985   Due to dysmennorhea   US ECHOCARDIOGRAPHY  06/02/2007     FAMILY HISTORY:  Family History  Problem Relation Age of Onset   Heart disease Mother    Thyroid disease Mother    Emphysema Father        One lung   Cystic fibrosis Sister    Hyperthyroidism Sister     Osteoarthritis Brother    Hyperthyroidism Brother    Esophageal cancer Neg Hx    Cancer Neg Hx        Head or neck   Colon cancer Neg Hx    Stomach cancer Neg Hx    Breast cancer Neg Hx      SOCIAL HISTORY:  reports that she quit smoking about 16 years ago. Her smoking use included cigarettes. She has a 100.00 pack-year smoking history. She has never used smokeless tobacco. She reports current alcohol use.  She reports that she does not use drugs. The patient is married and lives in [place]. She is often helped by her daughter Lavella Lemons who lives in [place].   ALLERGIES: Antihistamines, diphenhydramine-type; Sulfonamide derivatives; Incruse ellipta [umeclidinium bromide]; Clarithromycin; Codeine; Lyrica [pregabalin]; Spiriva handihaler [tiotropium bromide monohydrate]; and Vraylar [cariprazine]   MEDICATIONS:  Current Outpatient Medications  Medication Sig Dispense Refill   albuterol (PROAIR HFA) 108 (90 Base) MCG/ACT inhaler Inhale 2 puffs into the lungs every 6 (six) hours as needed for wheezing or shortness of breath. INHALE 2 PUFFS INTO THE LUNGS EVERY 6 HOURS AS NEEDED FOR WHEEZING OR SHORTNESS OF BREATH 3 Inhaler 3   arformoterol (BROVANA) 15 MCG/2ML NEBU Take 2 mLs (15 mcg total) by nebulization 2 (two) times daily. 360 mL 3   budesonide (PULMICORT) 0.5 MG/2ML nebulizer solution Take 2 mLs (0.5 mg total) by nebulization in the morning and at bedtime. 360 mL 3   busPIRone (BUSPAR) 15 MG tablet Take 15 mg by mouth 2 (two) times daily.     cephALEXin (KEFLEX) 500 MG capsule Take 1 capsule (500 mg total) by mouth 2 (two) times daily. 14 capsule 0   Cholecalciferol (VITAMIN D) 50 MCG (2000 UT) tablet Take 1 tablet (2,000 Units total) by mouth daily.     Coenzyme Q-10 200 MG CAPS Take 200 mg by mouth daily.     Cyanocobalamin (B-12) 5000 MCG CAPS Take 5,000 mcg by mouth daily.     cyclobenzaprine (FLEXERIL) 10 MG tablet Take 1 tablet (10 mg total) by mouth at bedtime. 10 tablet 0    esomeprazole (NEXIUM) 40 MG capsule Take 1 capsule (40 mg total) by mouth daily at 12 noon. 30 capsule 11   ezetimibe (ZETIA) 10 MG tablet TAKE 1 TABLET BY MOUTH AT BEDTIME 90 tablet 2   fexofenadine (ALLEGRA) 180 MG tablet Take 180 mg by mouth as needed.      FLUoxetine (PROZAC) 20 MG tablet Take 20 mg by mouth 2 (two) times daily.     fluticasone (FLONASE) 50 MCG/ACT nasal spray USE 2 SPRAYS INTO THE NOSE EVERY 12 HOURS AS NEEDED FOR STUFFY NOSE 48 mL 2   furosemide (LASIX) 20 MG tablet Take 1-2 tablets (20-40 mg total) by mouth daily as needed for fluid. 90 tablet 1   glucose blood (ONETOUCH VERIO) test strip USE AS INSTRUCTED TO CHECK SUGAR 1 TIME DAILY 100 strip 8   guaiFENesin (MUCINEX) 600 MG 12 hr tablet Take 2 tablets (1,200 mg total) by mouth 2 (two) times daily as needed for cough or to loosen phlegm.     Iron, Ferrous Sulfate, 325 (65 Fe) MG TABS Take 325 mg by mouth daily. 90 tablet 3   levothyroxine (SYNTHROID) 125 MCG tablet Take 1 tablet (125 mcg total) by mouth daily. 90 tablet 3   metFORMIN (GLUCOPHAGE) 500 MG tablet Take 1 tablet (500 mg total) by mouth every evening. 90 tablet 3   nitroGLYCERIN (NITROSTAT) 0.4 MG SL tablet Place 1 tablet (0.4 mg total) under the tongue every 5 (five) minutes as needed for chest pain. 25 tablet 3   ONETOUCH DELICA LANCETS FINE MISC USE TO CHECK SUGAR 1 TIME DAILY 100 each 5   rosuvastatin (CRESTOR) 10 MG tablet Take 1 tablet (10 mg total) by mouth every other day. 45 tablet 2   Spacer/Aero Chamber Mouthpiece MISC Use with  inhaler as needed.  J44.9 1 each 0   No current facility-administered medications for this encounter.     REVIEW OF  SYSTEMS: On review of systems, the patient reports that she is doing fairly well but her daughter states her mother is not doing well with breathing. She continues at 2 1/2 L to 3L of O2. She cannot wear CPAP, and struggles with her nebulizer at times, but her daughter tries to help her as much as she can. There  is a medication that has been difficult to obtain due to manufacturing delays where it's produced from manufacture shut down. She has tried to reach out to Dr. Juanetta Gosling nurse and is still having trouble finding out when it will be available. The patient reports some discomfort with turning or moving positions along the right chest wall. She's previously been offered Gabapentin for other peripheral nerve pain but has declined this. No other complaints are verbalized.    PHYSICAL EXAM:  Unable to assess due to encounter type.   ECOG = 1  0 - Asymptomatic (Fully active, able to carry on all predisease activities without restriction)  1 - Symptomatic but completely ambulatory (Restricted in physically strenuous activity but ambulatory and able to carry out work of a light or sedentary nature. For example, light housework, office work)  2 - Symptomatic, <50% in bed during the day (Ambulatory and capable of all self care but unable to carry out any work activities. Up and about more than 50% of waking hours)  3 - Symptomatic, >50% in bed, but not bedbound (Capable of only limited self-care, confined to bed or chair 50% or more of waking hours)  4 - Bedbound (Completely disabled. Cannot carry on any self-care. Totally confined to bed or chair)  5 - Death   Eustace Pen MM, Creech RH, Tormey DC, et al. [phone]). "Toxicity and response criteria of the [facility] Group". [place] Oncol. 5 (6): 649-55       IMPRESSION/PLAN: 1. Putative Stage IA3, cT1cN0M0, NSCLC of the RUL. We discussed the imaging findings and discussed that I would like to discuss her imaging with Dr. Lisbeth Renshaw regarding the increase in the nodular changes seen in the RUL. I suspect he will recommend another shorter interval with scan in 4 months or so rather than 6. I will contact the patient's daughter tomorrow to confirm this.  2. Right posterior 3rd rib fracture. We discussed the findings to date and the patient seems to  feel like her symptoms are reasonably controlled. We discussed the possibility of using gabapentin if symptoms progress, or evaluation with interventional pain management if she declined gabapentin or did not find relief from this.  3. O2 dependent COPD. The patient will continue with Dr. Halford Chessman for follow up and we will follow her course expectantly.  4. Iron deficiency anemia. She will follow up with Dr. Rogue Bussing as well. We will follow this expectantly.     Given current concerns for patient exposure during the COVID-19 pandemic, this encounter was conducted via telephone.  The patient has provided two factor identification and has given verbal consent for this type of encounter and has been advised to only accept a meeting of this type in a secure network environment. The time spent during this encounter was 35 minutes including preparation, discussion, and coordination of the patient's care. The attendants for this meeting [place]  and MERON BOCCHINO and her daughter Adah Salvage. During the encounter,  Hayden Pedro was located at [facility].  LASHANE WHELPLEY was located at home, her daughter Adah Salvage was located  at her own home.     Carola Rhine, PAC

## 2021-11-16 NOTE — Addendum Note (Signed)
Encounter addended by: Hayden Pedro, PA-C on: 11/16/2021 3:10 PM  Actions taken: Level of Service modified

## 2021-11-16 NOTE — Addendum Note (Signed)
Encounter addended by: Hayden Pedro, PA-C on: 11/16/2021 3:26 PM  Actions taken: Clinical Note Signed

## 2021-11-17 ENCOUNTER — Telehealth: Payer: Self-pay | Admitting: Radiation Oncology

## 2021-11-17 DIAGNOSIS — C3411 Malignant neoplasm of upper lobe, right bronchus or lung: Secondary | ICD-10-CM

## 2021-11-17 NOTE — Telephone Encounter (Signed)
I called and spoke with the aptient and her daughter to confirm that Dr. Halford Chessman was satisfied with our plans and agreed that this area appeared consistent with her radiation treatment, as did Dr. Lisbeth Renshaw but Dr. Lisbeth Renshaw recommended repeating the scan about 4 months from now to make sure.

## 2021-11-19 ENCOUNTER — Encounter: Payer: Self-pay | Admitting: Internal Medicine

## 2021-11-19 ENCOUNTER — Other Ambulatory Visit (HOSPITAL_COMMUNITY): Payer: Self-pay

## 2021-11-19 NOTE — Telephone Encounter (Signed)
Pharmacy, do you have any information about the Budesonide being out of production?

## 2021-11-20 MED ORDER — BUDESONIDE 0.5 MG/2ML IN SUSP: 0.5000 mg | Freq: Two times a day (BID) | RESPIRATORY_TRACT | 3 refills | Status: DC

## 2021-11-23 ENCOUNTER — Telehealth: Payer: Self-pay

## 2021-11-23 ENCOUNTER — Ambulatory Visit
Admission: EM | Admit: 2021-11-23 | Discharge: 2021-11-23 | Disposition: A | Discharge status: Home / Self Care | Payer: Medicare Other | Attending: Emergency Medicine | Admitting: Emergency Medicine

## 2021-11-23 DIAGNOSIS — L03115 Cellulitis of right lower limb: Secondary | ICD-10-CM

## 2021-11-23 DIAGNOSIS — E11628 Type 2 diabetes mellitus with other skin complications: Secondary | ICD-10-CM

## 2021-11-23 DIAGNOSIS — S81831A Puncture wound without foreign body, right lower leg, initial encounter: Secondary | ICD-10-CM | POA: Diagnosis not present

## 2021-11-23 MED ORDER — DOXYCYCLINE HYCLATE 100 MG PO CAPS: 100.0000 mg | ORAL_CAPSULE | Freq: Two times a day (BID) | ORAL | 0 refills | Status: AC

## 2021-11-23 NOTE — Telephone Encounter (Signed)
Pt said on 11/22/21 pt sat in a w/c that the screw on the back of foot rest imbedded in rt lower leg; when pt realized this her shoe had blood in it. Today the area is not bleeding but draining a clear liquid and pt wants to be seen right away. No available appts at [facility] and no available appts at [facility] in [place] or [place] but pt is going to [facility] now; I put in notification over computer to [facility] that pt would arrive in next 22 mins (option given by computer). Pt appreciative and will send not to [name] who is out of office today. Dr Darnell Level who is in office and Janett Billow CMA.

## 2021-11-23 NOTE — ED Provider Notes (Signed)
[facility]    CSN: [id number] Arrival date & time: 11/23/21  1440      History   Chief Complaint Chief Complaint  Patient presents with   Wound Check    HPI Alexandria [name] is a 86 y.o. female.  Accompanied by a family friend, patient presents with a puncture wound on her right calf.  The puncture wound occurred when she was sitting in a wheelchair and did not feel a screw puncture her calf.  The area around the puncture wound has gotten red and is weeping clear fluid.  Patient is diabetic and at baseline has decreased sensation in her lower legs.  No fever, chills, purulent drainage, or other symptoms.  Last tetanus February 2023.   The history is provided by the patient and medical records.    Past Medical History:  Diagnosis Date   Allergy, unspecified not elsewhere classified    Anxiety    Cataract    Chronic diastolic congestive heart failure (HCC)    COPD (chronic obstructive pulmonary disease) ([place]) 03/08/1998   PFTs 12/12/2002 FEV 1 1.42 (64%) ratio 58 with no better after B2 and DLCO75%; PFTs 11/20/09 FEV1 1.50 (73%) ratio 50 no better after B2 with DLCO 62%; Hfa 50% 11/20/2009 >75%, 01/13/10 p coaching   Depression 12/07/1998   Diabetes mellitus type II 02/05/2006   Dr. Cruzita Lederer with endo   Disorders of bursae and tendons in shoulder region, unspecified    Rotator cuff syndrome, right   E. coli bacteremia    Esophagitis    GERD (gastroesophageal reflux disease)    History of UTI    Hyperlipidemia 10/04/2000   Hypertension 03/08/1992   Hypothyroidism 03/08/1968   Iron deficiency anemia    Lung nodule    radiation starts 10-08-2019   Malignant neoplasm of right upper lobe of lung ([place]) 07/24/2019   Obesity    NOS   OSA (obstructive sleep apnea)    PSG 01/27/10 AHI 13, pt does not know CPAP settings   Peripheral neuropathy    Likely due to DM per Dr. Murvin Natal hernia     Patient Active Problem List   Diagnosis Date Noted   Skin lesion 08/12/2021    Leg swelling 06/08/2021   Grief 05/27/2021   H/O laceration of skin 05/27/2021   PND (post-nasal drip) 04/27/2021   Wheezing 04/27/2021   Acute cough 04/27/2021   Physical deconditioning 01/30/2020   Healthcare maintenance 01/30/2020   PAD (peripheral artery disease) ([place]) 01/27/2020   Sore throat 11/21/2019   GERD (gastroesophageal reflux disease) 11/21/2019   CAD (coronary artery disease) 07/24/2019   Malignant neoplasm of right upper lobe of lung ([place]) 07/24/2019   Vitamin D deficiency 06/29/2018   Hyperkalemia 06/28/2018   Dysuria 01/01/2018   Abnormal findings on diagnostic imaging of lung 12/18/2017   Rhinitis 12/18/2017   Knee laceration, right, subsequent encounter 10/06/2016   Knee pain, acute 10/06/2016   Chronic pain of left knee 09/15/2016   Right foot pain 09/15/2016   Chronic respiratory failure with hypoxia ([place]) 07/14/2016   Diverticulosis of colon without hemorrhage    Hemorrhoids    Hiatal hernia    Type 2 diabetes mellitus with diabetic neuropathy, without long-term current use of insulin ([place]) 04/04/2015   Left temporal headache 03/14/2015   Vertigo 03/14/2015   Chest tightness 12/04/2014   Cystitis 11/03/2014   Chronic diastolic CHF (congestive heart failure) (HCC)    Iron deficiency anemia    Obesity hypoventilation syndrome (  [place])    Acute cystitis    Symptomatic anemia    Medicare annual wellness visit, subsequent 06/14/2014   Advance care planning 06/14/2014   RLQ abdominal pain 05/10/2014   COPD exacerbation ([place]) 04/28/2014   Lower extremity edema 09/15/2012   DOE (dyspnea on exertion) 08/24/2012   Shoulder pain 12/17/2011   Abdominal hernia 12/17/2011   Anxiety 09/23/2011   Osteopenia 07/25/2011   Obstructive sleep apnea 02/09/2010   MURMUR 02/04/2010   CHEST PAIN 01/13/2010   ONYCHOMYCOSIS, TOENAILS 06/30/2009   PALPITATIONS 08/29/2008   ESOPHAGEAL STENOSIS 11/22/2007   DYSPHAGIA 11/08/2007   ESOPHAGITIS 10/11/2007   COPD with  chronic bronchitis and emphysema ([place]) 05/23/2007   ALLERGY 07/25/2006   NUMBNESS 10/06/2001   Hyperlipemia 10/04/2000   Depression, recurrent ([place]) 03/08/1997   Essential hypertension 03/08/1992   Obesity, Class III, BMI 40-49.9 (morbid obesity) ([place]) 03/08/1989   ROTATOR CUFF SYNDROME, RIGHT 03/08/1982   Hypothyroidism 03/08/1968    Past Surgical History:  Procedure Laterality Date   ABD U/S  03/19/1999   Nml x2 foci in liver   ADENOSINE MYOVIEW  06/02/2007   Nml   CARDIOLITE PERSANTINE  08/24/2000   Nml   CAROTID U/S  08/24/2000   1-39% ICA stenosis   CAROTID U/S  06/02/2007   No apprec change    CARPAL TUNNEL RELEASE  12/1997   Right   CESAREAN SECTION     x2 [place]/ repeat   CHOLECYSTECTOMY  1997   COLONOSCOPY WITH PROPOFOL N/A 02/12/2016   Procedure: COLONOSCOPY WITH PROPOFOL;  Surgeon: Milus Banister, MD;  Location: [facility];  Service: Endoscopy;  Laterality: N/A;   CT ABD W & PELVIS WO/W CM     Abd hemangiomas of liver, 1 cm R renal cyst   DENTAL SURGERY  2016   Implants   DEXA  07/03/2003   Nml   ESOPHAGOGASTRODUODENOSCOPY  12/05/1997   Nml (due to hoarseness)   ESOPHAGOGASTRODUODENOSCOPY (EGD) WITH PROPOFOL N/A 02/12/2016   Procedure: ESOPHAGOGASTRODUODENOSCOPY (EGD) WITH PROPOFOL;  Surgeon: Milus Banister, MD;  Location: [facility];  Service: Endoscopy;  Laterality: N/A;   GALLBLADDER SURGERY     HERNIA REPAIR  01/24/2009   Lap Ventr w/ Lysis of adhesions (Dr. Donne Hazel)   knee arthroscopic surgery  years ago   right   ROTATOR CUFF REPAIR  1984   Right, [place]   SHOULDER OPEN ROTATOR CUFF REPAIR  02/08/2012   Procedure: ROTATOR CUFF REPAIR SHOULDER OPEN;  Surgeon: Magnus Sinning, MD;  Location: [facility];  Service: Orthopedics;  Laterality: Left;  Left Shoulder Open Anterior Acrominectomy Rotator Cuff Repair Open Distal Clavicle Resection ,tissue mend graft, and repair of biceps tendon   THUMB RELEASE  12/1997   Right   TONSILLECTOMY     TOTAL  ABDOMINAL HYSTERECTOMY  1985   Due to dysmennorhea   US ECHOCARDIOGRAPHY  06/02/2007    OB History   No obstetric history on file.      Home Medications    Prior to Admission medications   Medication Sig Start Date End Date Taking? Authorizing Provider  doxycycline (VIBRAMYCIN) 100 MG capsule Take 1 capsule (100 mg total) by mouth 2 (two) times daily for 7 days. 11/23/21 11/30/21 Yes Sharion Balloon, NP  albuterol (PROAIR HFA) 108 (90 Base) MCG/ACT inhaler Inhale 2 puffs into the lungs every 6 (six) hours as needed for wheezing or shortness of breath. INHALE 2 PUFFS INTO THE LUNGS EVERY 6 HOURS AS NEEDED FOR WHEEZING OR SHORTNESS  OF BREATH 04/19/18   Chesley Mires, MD  arformoterol (BROVANA) 15 MCG/2ML NEBU Take 2 mLs (15 mcg total) by nebulization 2 (two) times daily. 08/06/21   Cobb, Karie Schwalbe, NP  budesonide (PULMICORT) 0.5 MG/2ML nebulizer solution Take 2 mLs (0.5 mg total) by nebulization in the morning and at bedtime. 11/20/21   Chesley Mires, MD  busPIRone (BUSPAR) 15 MG tablet Take 15 mg by mouth 2 (two) times daily. 05/21/20   [provider]  Cholecalciferol (VITAMIN D) 50 MCG (2000 UT) tablet Take 1 tablet (2,000 Units total) by mouth daily. 06/27/18   Tonia Ghent, MD  Coenzyme Q-10 200 MG CAPS Take 200 mg by mouth daily.    [provider]  Cyanocobalamin (B-12) 5000 MCG CAPS Take 5,000 mcg by mouth daily.    [provider]  cyclobenzaprine (FLEXERIL) 10 MG tablet Take 1 tablet (10 mg total) by mouth at bedtime. 06/16/21   Hazel Sams, PA-C  esomeprazole (NEXIUM) 40 MG capsule Take 1 capsule (40 mg total) by mouth daily at 12 noon. 08/17/21   Levin Erp, PA  ezetimibe (ZETIA) 10 MG tablet TAKE 1 TABLET BY MOUTH AT BEDTIME 06/08/21   Tonia Ghent, MD  fexofenadine (ALLEGRA) 180 MG tablet Take 180 mg by mouth as needed.     [provider]  FLUoxetine (PROZAC) 20 MG tablet Take 20 mg by mouth 2 (two) times daily.    [provider]  fluticasone (FLONASE) 50 MCG/ACT nasal spray USE 2 SPRAYS INTO THE NOSE EVERY 12 HOURS AS NEEDED FOR STUFFY NOSE 03/10/20   Tonia Ghent, MD  furosemide (LASIX) 20 MG tablet Take 1-2 tablets (20-40 mg total) by mouth daily as needed for fluid. 08/10/21   Tonia Ghent, MD  glucose blood (ONETOUCH VERIO) test strip USE AS INSTRUCTED TO CHECK SUGAR 1 TIME DAILY 09/11/21   Philemon Kingdom, MD  guaiFENesin (MUCINEX) 600 MG 12 hr tablet Take 2 tablets (1,200 mg total) by mouth 2 (two) times daily as needed for cough or to loosen phlegm. 08/25/21   Chesley Mires, MD  Iron, Ferrous Sulfate, 325 (65 Fe) MG TABS Take 325 mg by mouth daily. 05/04/21   Tonia Ghent, MD  levothyroxine (SYNTHROID) 125 MCG tablet Take 1 tablet (125 mcg total) by mouth daily. 09/11/21   Philemon Kingdom, MD  metFORMIN (GLUCOPHAGE) 500 MG tablet Take 1 tablet (500 mg total) by mouth every evening. 09/11/21   Philemon Kingdom, MD  nitroGLYCERIN (NITROSTAT) 0.4 MG SL tablet Place 1 tablet (0.4 mg total) under the tongue every 5 (five) minutes as needed for chest pain. 07/16/21   Tonia Ghent, MD  [facility] DELICA LANCETS FINE MISC USE TO CHECK SUGAR 1 TIME DAILY 09/28/17   Philemon Kingdom, MD  rosuvastatin (CRESTOR) 10 MG tablet Take 1 tablet (10 mg total) by mouth every other day. 11/10/21   Tonia Ghent, MD  Spacer/Aero Chamber Mouthpiece MISC Use with  inhaler as needed.  J44.9 09/30/17   Tonia Ghent, MD    Family History Family History  Problem Relation Age of Onset   Heart disease Mother    Thyroid disease Mother    Emphysema Father        One lung   Cystic fibrosis Sister    Hyperthyroidism Sister    Osteoarthritis Brother    Hyperthyroidism Brother    Esophageal cancer Neg Hx    Cancer Neg Hx  Head or neck   Colon cancer Neg Hx    Stomach cancer Neg Hx    Breast cancer Neg Hx     Social History Social History   Tobacco Use   Smoking status: Former    Packs/day: 2.00     Years: 50.00    Total pack years: 100.00    Types: Cigarettes    Quit date: 02/05/2005    Years since quitting: 16.8   Smokeless tobacco: Never  Vaping Use   Vaping Use: Never used  Substance Use Topics   Alcohol use: Yes    Comment: occasionally   Drug use: Never     Allergies   Antihistamines, diphenhydramine-type; Sulfonamide derivatives; Incruse ellipta [umeclidinium bromide]; Clarithromycin; Codeine; Lyrica [pregabalin]; Spiriva handihaler [tiotropium bromide monohydrate]; and Vraylar [cariprazine]   Review of Systems Review of Systems  Constitutional:  Negative for chills and fever.  Skin:  Positive for color change and wound.  All other systems reviewed and are negative.    Physical Exam Triage Vital Signs ED Triage Vitals  Enc Vitals Group     BP 11/23/21 1533 129/71     Pulse Rate 11/23/21 1533 75     Resp 11/23/21 1533 18     Temp 11/23/21 1533 98.7 F (37.1 C)     Temp src --      SpO2 11/23/21 1533 95 %     Weight 11/23/21 1539 218 lb (98.9 kg)     Height 11/23/21 1539 5\' 5"  (1.651 m)     Head Circumference --      Peak Flow --      Pain Score 11/23/21 1539 0     Pain Loc --      Pain Edu? --      Excl. in [place]? --    No data found.  Updated Vital Signs BP 129/71   Pulse 75   Temp 98.7 F (37.1 C)   Resp 18   Ht 5\' 5"  (1.651 m)   Wt 218 lb (98.9 kg)   LMP  (LMP Unknown)   SpO2 95%   BMI 36.28 kg/m   Visual Acuity Right Eye Distance:   Left Eye Distance:   Bilateral Distance:    Right Eye Near:   Left Eye Near:    Bilateral Near:     Physical Exam Vitals and nursing note reviewed.  Constitutional:      General: She is not in acute distress.    Appearance: She is well-developed. She is not ill-appearing.  HENT:     Mouth/Throat:     Mouth: Mucous membranes are moist.  Cardiovascular:     Rate and Rhythm: Normal rate and regular rhythm.     Heart sounds: Normal heart sounds.  Pulmonary:     Effort: Pulmonary effort is normal. No  respiratory distress.     Breath sounds: Normal breath sounds.  Musculoskeletal:        General: Swelling present. Normal range of motion.     Cervical back: Neck supple.  Skin:    General: Skin is warm and dry.     Findings: Erythema and lesion present.     Comments: 2 cm puncture wound on posterior right calf with localized erythema and edema. Clear fluid weeping from lower leg.    Neurological:     Mental Status: She is alert.     Sensory: No sensory deficit.     Motor: No weakness.     Comments: Ambulatory but currently sitting  in wheelchair.   Psychiatric:        Mood and Affect: Mood normal.        Behavior: Behavior normal.      UC Treatments / Results  Labs (all labs ordered are listed, but only abnormal results are displayed) Labs Reviewed - No data to display  EKG   Radiology No results found.  Procedures Procedures (including critical care time)  Medications Ordered in UC Medications - No data to display  Initial Impression / Assessment and Plan / UC Course  I have reviewed the triage vital signs and the nursing notes.  Pertinent labs & imaging results that were available during my care of the patient were reviewed by me and considered in my medical decision making (see chart for details).    Puncture wound of right lower leg colitis.  Type 2 diabetes with skin complication.  Wound cleaned and dressed here.  Treating with doxycycline.  Referral to wound care center made per patient request.  Instructed her to follow-up with her PCP or the wound care center tomorrow for recheck.  ED precautions discussed.  Education provided on cellulitis and puncture wounds.  Tetanus is up-to-date.  Patient agrees to plan of care.  Final Clinical Impressions(s) / UC Diagnoses   Final diagnoses:  Puncture wound of right lower leg, initial encounter  Cellulitis of right lower leg  Type 2 diabetes mellitus with other skin complication, without long-term current use of  insulin (HCC)     Discharge Instructions      Take the doxycycline as directed.    Keep your wound clean and dry.  Wash it gently twice a day with soap and water.  Apply an antibiotic cream twice a day.    Follow-up with your primary care provider or the wound care center for a recheck tomorrow.    Go to the emergency department if you have worsening symptoms.          ED Prescriptions     Medication Sig Dispense Auth. Provider   doxycycline (VIBRAMYCIN) 100 MG capsule Take 1 capsule (100 mg total) by mouth 2 (two) times daily for 7 days. 14 capsule Sharion Balloon, NP      PDMP not reviewed this encounter.   Sharion Balloon, NP 11/23/21 1635

## 2021-11-23 NOTE — Discharge Instructions (Addendum)
Take the doxycycline as directed.    Keep your wound clean and dry.  Wash it gently twice a day with soap and water.  Apply an antibiotic cream twice a day.    Follow-up with your primary care provider or the wound care center for a recheck tomorrow.    Go to the emergency department if you have worsening symptoms.

## 2021-11-23 NOTE — Telephone Encounter (Signed)
Noted. Thanks.

## 2021-11-23 NOTE — ED Triage Notes (Addendum)
Patient to Urgent Care with complaints of a puncture wound present to her right calf. Reports injury occurred on her wheelchair, a screw on a wheelchair approx 1/2 inch long went into her leg and she didn't notice it for approx 15 minutes (personal wheelchair).  States that when she removed her leg it bled, reports bandage was wet this morning.   Bleeding well controlled at this time. Now weeping clear fluid.   TDAP shot up to date  Patient diabetic.

## 2021-11-24 ENCOUNTER — Ambulatory Visit: Payer: Medicare Other

## 2021-11-24 ENCOUNTER — Other Ambulatory Visit: Payer: Medicare Other

## 2021-11-24 ENCOUNTER — Ambulatory Visit: Payer: Medicare Other | Admitting: Internal Medicine

## 2021-11-25 ENCOUNTER — Inpatient Hospital Stay: Payer: Medicare Other | Attending: Internal Medicine

## 2021-11-25 ENCOUNTER — Inpatient Hospital Stay: Payer: Medicare Other

## 2021-11-25 ENCOUNTER — Encounter: Payer: Self-pay | Admitting: Nurse Practitioner

## 2021-11-25 ENCOUNTER — Inpatient Hospital Stay (HOSPITAL_BASED_OUTPATIENT_CLINIC_OR_DEPARTMENT_OTHER): Payer: Medicare Other | Admitting: Nurse Practitioner

## 2021-11-25 VITALS — BP 119/72 | HR 76 | Temp 98.1°F | Resp 18 | Wt 221.0 lb

## 2021-11-25 VITALS — BP 151/54 | HR 74 | Temp 98.0°F | Resp 18

## 2021-11-25 DIAGNOSIS — Z85118 Personal history of other malignant neoplasm of bronchus and lung: Secondary | ICD-10-CM | POA: Insufficient documentation

## 2021-11-25 DIAGNOSIS — D649 Anemia, unspecified: Secondary | ICD-10-CM

## 2021-11-25 DIAGNOSIS — Z79899 Other long term (current) drug therapy: Secondary | ICD-10-CM | POA: Insufficient documentation

## 2021-11-25 DIAGNOSIS — D500 Iron deficiency anemia secondary to blood loss (chronic): Secondary | ICD-10-CM | POA: Diagnosis not present

## 2021-11-25 DIAGNOSIS — D509 Iron deficiency anemia, unspecified: Secondary | ICD-10-CM | POA: Diagnosis not present

## 2021-11-25 DIAGNOSIS — Z87891 Personal history of nicotine dependence: Secondary | ICD-10-CM | POA: Diagnosis not present

## 2021-11-25 LAB — CBC WITH DIFFERENTIAL/PLATELET
Abs Immature Granulocytes: 0.01 10*3/uL (ref 0.00–0.07)
Basophils Absolute: 0 10*3/uL (ref 0.0–0.1)
Basophils Relative: 0 %
Eosinophils Absolute: 0.1 10*3/uL (ref 0.0–0.5)
Eosinophils Relative: 2 %
HCT: 26.2 % — ABNORMAL LOW (ref 36.0–46.0)
Hemoglobin: 7.6 g/dL — ABNORMAL LOW (ref 12.0–15.0)
Immature Granulocytes: 0 %
Lymphocytes Relative: 26 %
Lymphs Abs: 1.1 10*3/uL (ref 0.7–4.0)
MCH: 28 pg (ref 26.0–34.0)
MCHC: 29 g/dL — ABNORMAL LOW (ref 30.0–36.0)
MCV: 96.7 fL (ref 80.0–100.0)
Monocytes Absolute: 0.4 10*3/uL (ref 0.1–1.0)
Monocytes Relative: 8 %
Neutro Abs: 2.8 10*3/uL (ref 1.7–7.7)
Neutrophils Relative %: 64 %
Platelets: 230 10*3/uL (ref 150–400)
RBC: 2.71 MIL/uL — ABNORMAL LOW (ref 3.87–5.11)
RDW: 15.5 % (ref 11.5–15.5)
WBC: 4.4 10*3/uL (ref 4.0–10.5)
nRBC: 0 % (ref 0.0–0.2)

## 2021-11-25 LAB — BASIC METABOLIC PANEL
Anion gap: 4 — ABNORMAL LOW (ref 5–15)
BUN: 20 mg/dL (ref 8–23)
CO2: 33 mmol/L — ABNORMAL HIGH (ref 22–32)
Calcium: 8.6 mg/dL — ABNORMAL LOW (ref 8.9–10.3)
Chloride: 101 mmol/L (ref 98–111)
Creatinine, Ser: 0.69 mg/dL (ref 0.44–1.00)
GFR, Estimated: >60 mL/min (ref >60)
Glucose, Bld: 154 mg/dL — ABNORMAL HIGH (ref 70–99)
Potassium: 4.1 mmol/L (ref 3.5–5.1)
Sodium: 138 mmol/L (ref 135–145)

## 2021-11-25 MED ORDER — SODIUM CHLORIDE 0.9 % IV SOLN
Freq: Once | INTRAVENOUS | Status: AC
  Filled 2021-11-25: qty 250

## 2021-11-25 MED ORDER — SODIUM CHLORIDE 0.9 % IV SOLN
200.0000 mg | Freq: Once | INTRAVENOUS | Status: AC
  Administered 2021-11-25: 200 mg via INTRAVENOUS
  Filled 2021-11-25: qty 200

## 2021-11-25 NOTE — Progress Notes (Signed)
Patient here today for follow up regarding anemia.

## 2021-11-25 NOTE — Progress Notes (Addendum)
[facility] NOTE  Patient Care Team: Tonia Ghent, MD as PCP - General Rockey Situ, Kathlene November, MD as Consulting Physician (Cardiology) [name], NP as Nurse Practitioner (Adult Health Nurse Practitioner) Cammie Sickle, MD as Consulting Physician (Oncology)  CHIEF COMPLAINTS/PURPOSE OF CONSULTATION: ANEMIA   HEMATOLOGY HISTORY:  # CHRONIC INTERMITTENT ANEMIA  [since 2010]- Hb SEP 2022- 8; previously on PO Iron- ; WBC/platelets- Normal; MCV- 90s. EGD- > 15 years; /Colonoscopy:4 years- [Dr.Jacob;; in [place]] capsule-? Bone marrow Biopsy-none; OCT 2022- SEVERE IRON DEFICIENCY; myeloma work-up/hemolysis work-up negative; JAN 2023-CT scan duodenal thickening-recommend GI evaluation  #   AUG 2021- RIGHT LUNG stage I non-small cell lung cancer-  s/p SBRT   [Dr.Moody; [place]]; NOV 2022- CT chest-no recurrent disease.  # COPD on 2.5 lit/day [Dr.Sood]; diastolic CHF/ OCT 3810-FBP BP [needing to come off losartan- Dr.Gollan]; peripheral neuropathy.  HISTORY OF PRESENTING ILLNESS: Alone; 2.5 Lit/ O2 24 x7. In wheel chair.    Alexandria [name] 86 y.o.  female with chronic respiratory failure on oxygen and severe anemia secondary to iron deficiency of unclear etiology s/p venofer is here for follow-up. Continues to deny black or bloody stools. Has chronic fatigue. She saw GI who did not feel she was a candidate for invasive procedures so capsule study was not performed. She was placed on omeprazole which she has been taking every other day. She has significant peripheral neuropathy and recently had puncture wound of the RLE. Was seen in ER and is taking antibiotics. She continues to be followed by Dr. Halford Chessman with rad-onc for    Review of Systems  Constitutional:  Positive for malaise/fatigue. Negative for chills, fever and weight loss.  HENT:  Negative for hearing loss, nosebleeds, sore throat and tinnitus.   Eyes:  Negative for blurred vision and double vision.   Respiratory:  Positive for shortness of breath. Negative for cough, hemoptysis and wheezing.   Cardiovascular:  Negative for chest pain, palpitations and leg swelling.  Gastrointestinal:  Negative for abdominal pain, blood in stool, constipation, diarrhea, melena, nausea and vomiting.  Genitourinary:  Negative for dysuria and urgency.  Musculoskeletal:  Negative for back pain, falls, joint pain and myalgias.  Skin:  Negative for itching and rash.  Neurological:  Positive for weakness. Negative for dizziness, tingling, sensory change, loss of consciousness and headaches.  Endo/Heme/Allergies:  Negative for environmental allergies. Does not bruise/bleed easily.  Psychiatric/Behavioral:  Negative for depression. The patient is not nervous/anxious and does not have insomnia.     MEDICAL HISTORY:  Past Medical History:  Diagnosis Date   Allergy, unspecified not elsewhere classified    Anxiety    Cataract    Chronic diastolic congestive heart failure (HCC)    COPD (chronic obstructive pulmonary disease) ([place]) 03/08/1998   PFTs 12/12/2002 FEV 1 1.42 (64%) ratio 58 with no better after B2 and DLCO75%; PFTs 11/20/09 FEV1 1.50 (73%) ratio 50 no better after B2 with DLCO 62%; Hfa 50% 11/20/2009 >75%, 01/13/10 p coaching   Depression 12/07/1998   Diabetes mellitus type II 02/05/2006   Dr. Cruzita Lederer with endo   Disorders of bursae and tendons in shoulder region, unspecified    Rotator cuff syndrome, right   E. coli bacteremia    Esophagitis    GERD (gastroesophageal reflux disease)    History of UTI    Hyperlipidemia 10/04/2000   Hypertension 03/08/1992   Hypothyroidism 03/08/1968   Iron deficiency anemia    Lung nodule    radiation starts 10-08-2019  Malignant neoplasm of right upper lobe of lung ([place]) 07/24/2019   Obesity    NOS   OSA (obstructive sleep apnea)    PSG 01/27/10 AHI 13, pt does not know CPAP settings   Peripheral neuropathy    Likely due to DM per Dr. Murvin Natal hernia      SURGICAL HISTORY: Past Surgical History:  Procedure Laterality Date   ABD U/S  03/19/1999   Nml x2 foci in liver   ADENOSINE MYOVIEW  06/02/2007   Nml   CARDIOLITE PERSANTINE  08/24/2000   Nml   CAROTID U/S  08/24/2000   1-39% ICA stenosis   CAROTID U/S  06/02/2007   No apprec change    CARPAL TUNNEL RELEASE  12/1997   Right   CESAREAN SECTION     x2 [place]/ repeat   CHOLECYSTECTOMY  1997   COLONOSCOPY WITH PROPOFOL N/A 02/12/2016   Procedure: COLONOSCOPY WITH PROPOFOL;  Surgeon: Milus Banister, MD;  Location: [facility];  Service: Endoscopy;  Laterality: N/A;   CT ABD W & PELVIS WO/W CM     Abd hemangiomas of liver, 1 cm R renal cyst   DENTAL SURGERY  2016   Implants   DEXA  07/03/2003   Nml   ESOPHAGOGASTRODUODENOSCOPY  12/05/1997   Nml (due to hoarseness)   ESOPHAGOGASTRODUODENOSCOPY (EGD) WITH PROPOFOL N/A 02/12/2016   Procedure: ESOPHAGOGASTRODUODENOSCOPY (EGD) WITH PROPOFOL;  Surgeon: Milus Banister, MD;  Location: [facility];  Service: Endoscopy;  Laterality: N/A;   GALLBLADDER SURGERY     HERNIA REPAIR  01/24/2009   Lap Ventr w/ Lysis of adhesions (Dr. Donne Hazel)   knee arthroscopic surgery  years ago   right   ROTATOR CUFF REPAIR  1984   Right, [place]   SHOULDER OPEN ROTATOR CUFF REPAIR  02/08/2012   Procedure: ROTATOR CUFF REPAIR SHOULDER OPEN;  Surgeon: Magnus Sinning, MD;  Location: [facility];  Service: Orthopedics;  Laterality: Left;  Left Shoulder Open Anterior Acrominectomy Rotator Cuff Repair Open Distal Clavicle Resection ,tissue mend graft, and repair of biceps tendon   THUMB RELEASE  12/1997   Right   TONSILLECTOMY     TOTAL ABDOMINAL HYSTERECTOMY  1985   Due to dysmennorhea   US ECHOCARDIOGRAPHY  06/02/2007    SOCIAL HISTORY: Social History   Socioeconomic History   Marital status: Widowed    Spouse name: Not on file   Number of children: 2   Years of education: Not on file   Highest education level: Not on file  Occupational  History   Occupation: Retired    Fish farm manager: RETIRED  Tobacco Use   Smoking status: Former    Packs/day: 2.00    Years: 50.00    Total pack years: 100.00    Types: Cigarettes    Quit date: 02/05/2005    Years since quitting: 16.8   Smokeless tobacco: Never  Vaping Use   Vaping Use: Never used  Substance and Sexual Activity   Alcohol use: Yes    Comment: occasionally   Drug use: Never   Sexual activity: Not Currently  Other Topics Concern   Not on file  Social History Narrative   Widowed 2023, 2 children; Enjoys painting      Quit smoking in 2007; no alcohol; lives in [place]; [name]/d lives in [place]. Had own business. Dont drive- sec to neuropathy.    Social Determinants of Health   Financial Resource Strain: Not on file  Food Insecurity: Not on file  Transportation  Needs: Not on file  Physical Activity: Not on file  Stress: Not on file  Social Connections: Not on file  Intimate Partner Violence: Not on file    FAMILY HISTORY: Family History  Problem Relation Age of Onset   Heart disease Mother    Thyroid disease Mother    Emphysema Father        One lung   Cystic fibrosis Sister    Hyperthyroidism Sister    Osteoarthritis Brother    Hyperthyroidism Brother    Esophageal cancer Neg Hx    Cancer Neg Hx        Head or neck   Colon cancer Neg Hx    Stomach cancer Neg Hx    Breast cancer Neg Hx     ALLERGIES:  is allergic to antihistamines, diphenhydramine-type; sulfonamide derivatives; incruse ellipta [umeclidinium bromide]; clarithromycin; codeine; lyrica [pregabalin]; spiriva handihaler [tiotropium bromide monohydrate]; and vraylar [cariprazine].  MEDICATIONS:  Current Outpatient Medications  Medication Sig Dispense Refill   albuterol (PROAIR HFA) 108 (90 Base) MCG/ACT inhaler Inhale 2 puffs into the lungs every 6 (six) hours as needed for wheezing or shortness of breath. INHALE 2 PUFFS INTO THE LUNGS EVERY 6 HOURS AS NEEDED FOR WHEEZING OR SHORTNESS OF  BREATH 3 Inhaler 3   arformoterol (BROVANA) 15 MCG/2ML NEBU Take 2 mLs (15 mcg total) by nebulization 2 (two) times daily. 360 mL 3   budesonide (PULMICORT) 0.5 MG/2ML nebulizer solution Take 2 mLs (0.5 mg total) by nebulization in the morning and at bedtime. 360 mL 3   busPIRone (BUSPAR) 15 MG tablet Take 15 mg by mouth 2 (two) times daily.     Cholecalciferol (VITAMIN D) 50 MCG (2000 UT) tablet Take 1 tablet (2,000 Units total) by mouth daily.     Coenzyme Q-10 200 MG CAPS Take 200 mg by mouth daily.     Cyanocobalamin (B-12) 5000 MCG CAPS Take 5,000 mcg by mouth daily.     cyclobenzaprine (FLEXERIL) 10 MG tablet Take 1 tablet (10 mg total) by mouth at bedtime. 10 tablet 0   doxycycline (VIBRAMYCIN) 100 MG capsule Take 1 capsule (100 mg total) by mouth 2 (two) times daily for 7 days. 14 capsule 0   esomeprazole (NEXIUM) 40 MG capsule Take 1 capsule (40 mg total) by mouth daily at 12 noon. 30 capsule 11   ezetimibe (ZETIA) 10 MG tablet TAKE 1 TABLET BY MOUTH AT BEDTIME 90 tablet 2   fexofenadine (ALLEGRA) 180 MG tablet Take 180 mg by mouth as needed.      FLUoxetine (PROZAC) 20 MG tablet Take 20 mg by mouth 2 (two) times daily.     fluticasone (FLONASE) 50 MCG/ACT nasal spray USE 2 SPRAYS INTO THE NOSE EVERY 12 HOURS AS NEEDED FOR STUFFY NOSE 48 mL 2   furosemide (LASIX) 20 MG tablet Take 1-2 tablets (20-40 mg total) by mouth daily as needed for fluid. 90 tablet 1   glucose blood (ONETOUCH VERIO) test strip USE AS INSTRUCTED TO CHECK SUGAR 1 TIME DAILY 100 strip 8   guaiFENesin (MUCINEX) 600 MG 12 hr tablet Take 2 tablets (1,200 mg total) by mouth 2 (two) times daily as needed for cough or to loosen phlegm.     Iron, Ferrous Sulfate, 325 (65 Fe) MG TABS Take 325 mg by mouth daily. 90 tablet 3   levothyroxine (SYNTHROID) 125 MCG tablet Take 1 tablet (125 mcg total) by mouth daily. 90 tablet 3   metFORMIN (GLUCOPHAGE) 500 MG tablet Take 1 tablet (  500 mg total) by mouth every evening. 90 tablet 3    nitroGLYCERIN (NITROSTAT) 0.4 MG SL tablet Place 1 tablet (0.4 mg total) under the tongue every 5 (five) minutes as needed for chest pain. 25 tablet 3   ONETOUCH DELICA LANCETS FINE MISC USE TO CHECK SUGAR 1 TIME DAILY 100 each 5   rosuvastatin (CRESTOR) 10 MG tablet Take 1 tablet (10 mg total) by mouth every other day. 45 tablet 2   Spacer/Aero Chamber Mouthpiece MISC Use with  inhaler as needed.  J44.9 1 each 0   No current facility-administered medications for this visit.      PHYSICAL EXAMINATION:    Physical Exam Vitals and nursing note reviewed.  HENT:     Head: Normocephalic and atraumatic.  Cardiovascular:     Rate and Rhythm: Normal rate and regular rhythm.  Pulmonary:     Comments: Decreased breath sounds bilaterally.  Scattered wheezing. Musculoskeletal:        General: Normal range of motion.     Cervical back: Normal range of motion.  Skin:    General: Skin is warm.     Comments: Wound to right calf, covered with gauze dressing.   Neurological:     General: No focal deficit present.     Mental Status: She is alert and oriented to person, place, and time.  Psychiatric:        Behavior: Behavior normal.        Judgment: Judgment normal.     LABORATORY DATA:  I have reviewed the data as listed Lab Results  Component Value Date   WBC 4.4 11/25/2021   HGB 7.6 (L) 11/25/2021   HCT 26.2 (L) 11/25/2021   MCV 96.7 11/25/2021   PLT 230 11/25/2021   Recent Labs    12/16/20 1209 01/15/21 1143 06/08/21 1547 07/24/21 1252 08/06/21 1620 09/23/21 1353 11/12/21 1422 11/25/21 1319  NA 140   < > 142 141 142 140  --  138  K 4.5   < > 4.7 4.8 4.4 4.2  --  4.1  CL 100   < > 103 102 104 104  --  101  CO2 33*   < > 31 33* 33* 31  --  33*  GLUCOSE 122*   < > 91 141* 114* 171*  --  154*  BUN 24*   < > _0 24*  --  20  CREATININE 0.74   < > 0.80 0.81 0.82 0.80 0.80 0.69  CALCIUM 8.9   < > 9.6 8.6* 9.3 8.3*  --  8.6*  GFRNONAA >60   < >  --  >60  --  >60  --   >60  PROT 6.5  --  6.9  --   --   --   --   --   ALBUMIN 3.4*  --  3.9  --   --   --   --   --   AST 14*  --  13  --   --   --   --   --   ALT 12  --  7  --   --   --   --   --   ALKPHOS 74  --  67  --   --   --   --   --   BILITOT 0.2*  --  0.4  --   --   --   --   --    < > = values  in this interval not displayed.  Iron/TIBC/Ferritin/ %Sat    Component Value Date/Time   IRON 32 09/23/2021 1353   TIBC 374 09/23/2021 1353   FERRITIN 10 (L) 09/23/2021 1353   IRONPCTSAT 9 (L) 09/23/2021 1353      CT CHEST W CONTRAST  Result Date: 11/13/2021 CLINICAL DATA:  Non-small cell lung cancer.  Status post SBRT. EXAM: CT CHEST WITH CONTRAST TECHNIQUE: Multidetector CT imaging of the chest was performed during intravenous contrast administration. RADIATION DOSE REDUCTION: This exam was performed according to the departmental dose-optimization program which includes automated exposure control, adjustment of the mA and/or kV according to patient size and/or use of iterative reconstruction technique.  CONTRAST:  25m OMNIPAQUE IOHEXOL 300 MG/ML  SOLN COMPARISON:  07/15/2021 FINDINGS: Cardiovascular: Heart size is normal. No pericardial effusion identified. Aortic atherosclerosis and multi vessel coronary artery calcifications. Aortic and mitral valve calcifications. Mediastinum/Nodes: No enlarged mediastinal, hilar, or axillary lymph nodes. Thyroid gland, trachea, and esophagus demonstrate no significant findings. Lungs/Pleura: There is progressive masslike architectural distortion with surrounding fibrosis within the apical portion of the right upper lobe. This is favored to represent changes secondary to external beam radiation. Previously referenced new area of nodular density along the anteromedial margin of the radiation port measures 2.2 x 1.4 cm, image 30/7. Previously this was reported at 1.3 x 1.1 cm. The tiny right pleural effusion noted on the previous exam has resolved in the interval. Additionally,  the previously reported pleural nodularity overlying the periphery of the right base has resolved in the interval. Previously reported scattered lung nodules are again noted. -Index nodule within the posterior left upper lobe is stable measuring 3 mm, image 66/7. -Tiny nodule along the right base is stable measuring 3 mm, image 58/7. -3 mm nodule in the anterolateral right lower lobe is also unchanged, image 107/7. -New tiny nodule within the periphery of the left upper lobe measures 3 mm, image 53/7. -3 mm nodule within the posterolateral left upper lobe is unchanged, image 46/7. Upper Abdomen: No acute abnormality. Enhancing lesion within the dome of right lobe of liver is stable measuring 1.8 cm and is favored to represent a hemangioma, image 81/5. Similar appearance of benign exophytic hemangioma arising off the lateral segment of left lobe of liver measuring 4.2 x 2.5 cm, image 122/2. Unchanged Bosniak class 2 lesion arising off the upper pole of left kidney measuring 1 cm, image 152/2. No follow-up imaging recommended. Musculoskeletal: Unchanged appearance of benign sclerotic lesion involving the anterolateral aspect of the left ninth rib, image 112/2. There is a new fracture deformity involving the posterior aspect of the right third rib, image 28/2. The bone here appears partially demineralized. Findings are suspicious for pathologic fracture secondary to radiation necrosis. IMPRESSION: 1. There is progressive masslike architectural distortion with surrounding fibrosis within the apical portion of the right upper lobe. Previously referenced new area of nodular density along the anteromedial margin of the radiation port measures 2.2 x 1.4 cm. Previously this was reported at 1.3 x 1.1 cm. 2. There is a new fracture deformity involving the posterior aspect of the right third rib. The bone here appears partially demineralized. Findings are suspicious for pathologic fracture secondary to radiation necrosis. 3.  Previously reported scattered lung nodules are stable from previous exam. There is a new tiny nodule within the periphery of the left upper lobe measures 3 mm. 4. Aortic Atherosclerosis (ICD10-I70.0). Electronically Signed   By: TKerby MoorsM.D.   On: 11/13/2021 15:48  Assessment & Plan:   # Iron deficiency anemia- October 2022, ferritin 7. Symptomatic. Chronic, intermittent since 2010. Etiology is unclear. CT AP Mar 18, 2021 showed mild wall thickening of the proximal duodenum at least in part related to under distention and thought to possibly reflect peptic ulcer disease vs duodenitis. S/p evaluation with Dr. Ardis Hughs in [facility] (June 2023). She has not had capsule study d/t it not changing outcomes d/t her inability to undergo invasive procedures. Plan is to continue supportive care with iron infusions.  Hgb 7.6 today. Plan for venofer x 5, biweekly. She will receive first infusion today. Given that her counts are worsening and hse is now borderline for transfusions, will plan shorter monitoring interval. Plan to check labs in 3 weeks to ensure she hasn't continued to drop and then repeat labs, consideration of IV iron in 6 weeks.   Duodenitis- managed by GI. Recommended she take nexium daily asopposed to every other day. Reviewed rationale and mechanism of PPI medications. Given worsening blood counts despite iv iron, will reach out to [facility] to update them and see if patient has options.   # Lung Cancer- hx of Stage IA3 NSCLC of RUL, diagnosed in 2021 s/p SBRT completed 10/18/19. Slight change in RUL nodule, plan is to repeat imaging in 4 mo as opposed to 6 mo.   # RLE puncture wound- d/t peripheral neuropathy. S/p ER visit. On doxycyline. Follo wup with pcp.    - addendum: I reached out to GI and received this response, 'I have reviewed the chart since Dr. Ardis Hughs is on leave and I think this lady remains too ill for elective endoscopic evaluation'-   Gatha Mayer, MD, [facility]    Disposition: Iron twice weekly x 5 (she will receive 1st dose today). 3 weeks- lab only (cbc, hold tube) 6 weeks- lab (cbc, ferritin, iron studies, cmp), Dr. Rogue Bussing, +/- venofer- la   No problem-specific Assessment & Plan notes found for this encounter. All questions were answered. The patient knows to call the clinic with any problems, questions or concerns.    Verlon Au, NP 11/25/2021

## 2021-11-27 ENCOUNTER — Inpatient Hospital Stay: Payer: Medicare Other

## 2021-11-27 VITALS — BP 136/55 | HR 76 | Temp 97.8°F | Resp 18

## 2021-11-27 DIAGNOSIS — D509 Iron deficiency anemia, unspecified: Secondary | ICD-10-CM | POA: Diagnosis not present

## 2021-11-27 DIAGNOSIS — Z85118 Personal history of other malignant neoplasm of bronchus and lung: Secondary | ICD-10-CM | POA: Diagnosis not present

## 2021-11-27 DIAGNOSIS — Z79899 Other long term (current) drug therapy: Secondary | ICD-10-CM | POA: Diagnosis not present

## 2021-11-27 DIAGNOSIS — Z87891 Personal history of nicotine dependence: Secondary | ICD-10-CM | POA: Diagnosis not present

## 2021-11-27 DIAGNOSIS — D649 Anemia, unspecified: Secondary | ICD-10-CM

## 2021-11-27 MED ORDER — SODIUM CHLORIDE 0.9 % IV SOLN
200.0000 mg | Freq: Once | INTRAVENOUS | Status: AC
  Administered 2021-11-27: 200 mg via INTRAVENOUS
  Filled 2021-11-27: qty 200

## 2021-11-27 MED ORDER — SODIUM CHLORIDE 0.9 % IV SOLN
Freq: Once | INTRAVENOUS | Status: AC
  Filled 2021-11-27: qty 250

## 2021-12-01 ENCOUNTER — Ambulatory Visit (INDEPENDENT_AMBULATORY_CARE_PROVIDER_SITE_OTHER): Payer: Medicare Other | Admitting: Pulmonary Disease

## 2021-12-01 ENCOUNTER — Encounter: Payer: Self-pay | Admitting: Pulmonary Disease

## 2021-12-01 VITALS — BP 120/60 | HR 83 | Ht 65.0 in | Wt 219.4 lb

## 2021-12-01 DIAGNOSIS — R058 Other specified cough: Secondary | ICD-10-CM | POA: Diagnosis not present

## 2021-12-01 DIAGNOSIS — J9611 Chronic respiratory failure with hypoxia: Secondary | ICD-10-CM

## 2021-12-01 DIAGNOSIS — C3411 Malignant neoplasm of upper lobe, right bronchus or lung: Secondary | ICD-10-CM

## 2021-12-01 DIAGNOSIS — I25118 Atherosclerotic heart disease of native coronary artery with other forms of angina pectoris: Secondary | ICD-10-CM | POA: Diagnosis not present

## 2021-12-01 DIAGNOSIS — J449 Chronic obstructive pulmonary disease, unspecified: Secondary | ICD-10-CM | POA: Diagnosis not present

## 2021-12-01 MED ORDER — ARFORMOTEROL TARTRATE 15 MCG/2ML IN NEBU: 15.0000 ug | INHALATION_SOLUTION | Freq: Every day | RESPIRATORY_TRACT | 6 refills | Status: DC

## 2021-12-01 MED FILL — Iron Sucrose Inj 20 MG/ML (Fe Equiv): INTRAVENOUS | Qty: 10 | Status: AC

## 2021-12-01 NOTE — Patient Instructions (Addendum)
Budesonide 1 vial nebulized in the morning and in the evening, and rinse your mouth after each use.  Aformoterol 1 vial nebulized in the evening.  Albuterol every 4 to 6 hours as needed for cough, wheeze, chest congestion or shortness of breath.  Sip water when you have the urge to cough.  Avoid forcing a cough or clearing your throat too much.  Follow up in 4 months.

## 2021-12-01 NOTE — Progress Notes (Signed)
[facility]  Chief Complaint  Patient presents with   Follow-up    Constitutional:  BP 120/60 (BP Location: Left Arm)   Pulse 83   Ht 5\' 5"  (1.651 m)   Wt 219 lb 6.4 oz (99.5 kg)   LMP  (LMP Unknown)   SpO2 99%   BMI 36.51 kg/m   Past Medical History:  Depression, Hypothyroidism, HTN, Diastolic CHF, HLD, DM, GERD, Peripheral neuropathy  Past Surgical History:  She  has a past surgical history that includes Tonsillectomy; Rotator cuff repair (1984); Cesarean section; Total abdominal hysterectomy (1985); Cholecystectomy (1997); THUMB RELEASE (12/1997); Carpal tunnel release (12/1997); Esophagogastroduodenoscopy (12/05/1997); ABD U/S (03/19/1999); CT ABD W & PELVIS WO/W CM; CARDIOLITE PERSANTINE (08/24/2000); CAROTID U/S (08/24/2000); DEXA (07/03/2003); ADENOSINE MYOVIEW (06/02/2007); CAROTID U/S (06/02/2007); US ECHOCARDIOGRAPHY (06/02/2007); Hernia repair (01/24/2009); knee arthroscopic surgery (years ago); Shoulder open rotator cuff repair (02/08/2012); Gallbladder surgery; Dental surgery (2016); Colonoscopy with propofol (N/A, 02/12/2016); and Esophagogastroduodenoscopy (egd) with propofol (N/A, 02/12/2016).  Brief Summary:  Alexandria [name] is a 86 y.o. female female with dyspnea from COPD, diastolic CHF, deconditioning, and iron deficiency anemia.  She also has hx of OSA, but intolerant of CPAP.      Subjective:   Her daughter participated by phone.  CT chest showed enlargement of RUL mass like area of fibrosis.  She was seen by radiation oncology.  She gets tremor for about 2 hours after using aformoterol.  She hasn't been able to get budesonide recently.  She isn't having chest congestion, wheeze, or chest pain.  Using 2 to 3 liters oxygen.  She has been getting post nasal drip.  Her throat gets irritated.  She clears her throat and has to force a cough.  Physical Exam:   Appearance - well kempt, wearing oxygen, tremor  ENMT -  no sinus tenderness, no oral exudate, no LAN, Mallampati 3 airway, no stridor  Respiratory - decreased breath sounds bilaterally, no wheezing or rales  CV - s1s2 regular rate and rhythm, no murmurs  Ext - no clubbing, no edema  Skin - no rashes  Psych - normal mood and affect      Pulmonary testing:  RAST 10/02/10 >> negative, IgE 8.1 A1AT 04/15/11 >> MM PFT 10/512 >> FEV1 1.29 (61%), FEV1% 57, TLC 6.01 (119%), RV 3.52 (153%), DLCO 67%, no BD PFT 08/15/14 >> FEV1 1.07 (57%), FEV1% 55, TLC 6.49 (128%), DLCO 47%, no BD  Chest Imaging:  CT chest 06/29/16 >> atherosclerosis, RUL nodule 7 mm (was 8 mm), no change 6 mm LUL nodule, no change 4 mm RLL nodule  CT chest 12/23/17 >> 10 mm RUL nodule, 7 mm RUL nodule, 4 mm RLL nodule, 6 mm LUL nodule, centrilobular emphysema PET scan 01/06/18 >> RUL nodule not hypermetabolic CT chest 7/51/70 >> atherosclerosis, moderate centrilobular emphysema with diffuse bronchial wall thickening, 2.4 cm posterior apical irregular nodule increased from 2.3 cm, scar medial RLL, scattered nodules up to 4 mm in medial RLL, 6 mm nodule in LLL CT chest 12/14/18 >> no change RUL nodule CT chest 06/13/19 >> 2.2 x 1.2 cm RUL nodule (was 2.1 x 1.0 cm from October 2020), 0.4 cm nodule LLL CT chest 01/16/21 >> post XRT changes to RUL CT chest 07/17/21 >> XRT changes in RUL, new 1.3 x 1.1 cm nodular density in RUL, scattered smaller nodules CT chest 11/13/21 >> progressive mass like distortion with fibrosis in apex of RUL now 2.2 x 1.4 cm  Sleep Tests:  PSG 01/27/10 >> AHI 13, SpO2 low 73% ONO with CPAP 08/09/14 >> Test time 9 hr 16 min.  Mean SpO2 92.2%, low SpO2 86%. Spent 10 min with SpO2 < 88% ONO with CPAP 06/24/16 >> test time 7 hrs 29 min.  Average SpO2 90%, low SpO2 79%.  Spent 58 min with SpO2 < 88%. ONO with RA 11/01/16 >> test time 6 hrs 43 min.  Average SpO2 89%, low SpO2 67%.  Spent 2 hrs 7 min with SpO2 < 88%. ONO with 2 liters 07/18/18 >> test time 8 hrs 34 min.   Baseline SpO2 94%, low SpO2 69%.  Spent 38 min with SpO2 < 88%.  Cardiac Tests:  Echo 02/11/21 >> EF 55 to 60%, grade 2 DD, mild/mod LA dilation  Social History:  She  reports that she quit smoking about 16 years ago. Her smoking use included cigarettes. She has a 100.00 pack-year smoking history. She has never used smokeless tobacco. She reports current alcohol use. She reports that she does not use drugs.  Family History:  Her family history includes Cystic fibrosis in her sister; Emphysema in her father; Heart disease in her mother; Hyperthyroidism in her brother and sister; Osteoarthritis in her brother; Thyroid disease in her mother.     Assessment/Plan:    COPD with emphysema and chronic bronchitis. - intolerant of LAMA's - Advair caused throat irritation and increased cough - had detailed discussion about different roles for her medications - will have her resume budesonide one vial nebulized bid - will switch aformoterol to one vial nebulized at night and see if this mitigates her tremor - albuterol prn - prn mucinex   Chronic respiratory failure with hypoxia. - 2 to 3 liters oxygen 24/7 - goal SpO2 > 92%   Rt upper lung nodule. - she will need follow up CT chest in January 2024  Upper airway cough from allergic rhinitis with post nasal drip. - prn allegra, flonase - sip water with urge to cough and avoid forcing cough/clearing throat  Time Spent Involved in Patient Care on Day of Examination:  37 minutes  Follow up:   Patient Instructions  Budesonide 1 vial nebulized in the morning and in the evening, and rinse your mouth after each use.  Aformoterol 1 vial nebulized in the evening.  Albuterol every 4 to 6 hours as needed for cough, wheeze, chest congestion or shortness of breath.  Sip water when you have the urge to cough.  Avoid forcing a cough or clearing your throat too much.  Follow up in 4 months.  Medication List:   Allergies as of 12/01/2021        Reactions   Antihistamines, Diphenhydramine-type Other (See Comments)   Able to tolerate only allegra.    Sulfonamide Derivatives Swelling   REACTION: closed throat   Incruse Ellipta [umeclidinium Bromide] Cough   Caused voice change and severe coughing   Clarithromycin Other (See Comments)   REACTION: diff swallowing and mouth blisters   Codeine Other (See Comments)   Unknown    Lyrica [pregabalin] Other (See Comments)   Lack of effect for neuropathy pain.     Spiriva Handihaler [tiotropium Bromide Monohydrate] Other (See Comments)   Voice changes   Vraylar [cariprazine]    intolerant        Medication List        Accurate as of December 01, 2021  4:50 PM. If you have any questions, ask your nurse or doctor.  albuterol 108 (90 Base) MCG/ACT inhaler Commonly known as: ProAir HFA Inhale 2 puffs into the lungs every 6 (six) hours as needed for wheezing or shortness of breath. INHALE 2 PUFFS INTO THE LUNGS EVERY 6 HOURS AS NEEDED FOR WHEEZING OR SHORTNESS OF BREATH   arformoterol 15 MCG/2ML Nebu Commonly known as: BROVANA Take 2 mLs (15 mcg total) by nebulization at bedtime. What changed: when to take this Changed by: Chesley Mires, MD   B-12 5000 MCG Caps Take 5,000 mcg by mouth daily.   budesonide 0.5 MG/2ML nebulizer solution Commonly known as: Pulmicort Take 2 mLs (0.5 mg total) by nebulization in the morning and at bedtime.   busPIRone 15 MG tablet Commonly known as: BUSPAR Take 15 mg by mouth 2 (two) times daily.   Coenzyme Q-10 200 MG Caps Take 200 mg by mouth daily.   cyclobenzaprine 10 MG tablet Commonly known as: FLEXERIL Take 1 tablet (10 mg total) by mouth at bedtime.   esomeprazole 40 MG capsule Commonly known as: NexIUM Take 1 capsule (40 mg total) by mouth daily at 12 noon.   ezetimibe 10 MG tablet Commonly known as: ZETIA TAKE 1 TABLET BY MOUTH AT BEDTIME   fexofenadine 180 MG tablet Commonly known as: ALLEGRA Take 180 mg by  mouth as needed.   FLUoxetine 20 MG tablet Commonly known as: PROZAC Take 20 mg by mouth 2 (two) times daily.   fluticasone 50 MCG/ACT nasal spray Commonly known as: FLONASE USE 2 SPRAYS INTO THE NOSE EVERY 12 HOURS AS NEEDED FOR STUFFY NOSE   furosemide 20 MG tablet Commonly known as: LASIX Take 1-2 tablets (20-40 mg total) by mouth daily as needed for fluid.   guaiFENesin 600 MG 12 hr tablet Commonly known as: Mucinex Take 2 tablets (1,200 mg total) by mouth 2 (two) times daily as needed for cough or to loosen phlegm.   Iron (Ferrous Sulfate) 325 (65 Fe) MG Tabs Take 325 mg by mouth daily.   levothyroxine 125 MCG tablet Commonly known as: SYNTHROID Take 1 tablet (125 mcg total) by mouth daily.   metFORMIN 500 MG tablet Commonly known as: GLUCOPHAGE Take 1 tablet (500 mg total) by mouth every evening.   nitroGLYCERIN 0.4 MG SL tablet Commonly known as: NITROSTAT Place 1 tablet (0.4 mg total) under the tongue every 5 (five) minutes as needed for chest pain.   OneTouch Delica Lancets Fine Misc USE TO CHECK SUGAR 1 TIME DAILY   OneTouch Verio test strip Generic drug: glucose blood USE AS INSTRUCTED TO CHECK SUGAR 1 TIME DAILY   rosuvastatin 10 MG tablet Commonly known as: CRESTOR Take 1 tablet (10 mg total) by mouth every other day.   Spacer/Aero Chamber [employer] Use with  inhaler as needed.  J44.9   Vitamin D 50 MCG (2000 UT) tablet Take 1 tablet (2,000 Units total) by mouth daily.        Signature:  Chesley Mires, MD [place] Pager - [phone] 12/01/2021, 4:50 PM

## 2021-12-02 ENCOUNTER — Inpatient Hospital Stay: Payer: Medicare Other

## 2021-12-02 VITALS — BP 131/60 | HR 77 | Temp 97.8°F

## 2021-12-02 DIAGNOSIS — D509 Iron deficiency anemia, unspecified: Secondary | ICD-10-CM | POA: Diagnosis not present

## 2021-12-02 DIAGNOSIS — Z87891 Personal history of nicotine dependence: Secondary | ICD-10-CM | POA: Diagnosis not present

## 2021-12-02 DIAGNOSIS — Z85118 Personal history of other malignant neoplasm of bronchus and lung: Secondary | ICD-10-CM | POA: Diagnosis not present

## 2021-12-02 DIAGNOSIS — Z79899 Other long term (current) drug therapy: Secondary | ICD-10-CM | POA: Diagnosis not present

## 2021-12-02 DIAGNOSIS — D649 Anemia, unspecified: Secondary | ICD-10-CM

## 2021-12-02 MED ORDER — SODIUM CHLORIDE 0.9 % IV SOLN
Freq: Once | INTRAVENOUS | Status: AC
  Filled 2021-12-02: qty 250

## 2021-12-02 MED ORDER — SODIUM CHLORIDE 0.9 % IV SOLN
200.0000 mg | Freq: Once | INTRAVENOUS | Status: AC
  Administered 2021-12-02: 200 mg via INTRAVENOUS
  Filled 2021-12-02: qty 200

## 2021-12-02 MED ORDER — SODIUM CHLORIDE 0.9% FLUSH
10.0000 mL | Freq: Once | INTRAVENOUS | Status: AC | PRN
  Administered 2021-12-02: 10 mL
  Filled 2021-12-02: qty 10

## 2021-12-02 NOTE — Patient Instructions (Signed)

## 2021-12-03 ENCOUNTER — Encounter (HOSPITAL_BASED_OUTPATIENT_CLINIC_OR_DEPARTMENT_OTHER): Payer: Medicare Other | Attending: Internal Medicine | Admitting: Internal Medicine

## 2021-12-03 DIAGNOSIS — L97818 Non-pressure chronic ulcer of other part of right lower leg with other specified severity: Secondary | ICD-10-CM | POA: Diagnosis not present

## 2021-12-03 DIAGNOSIS — I87321 Chronic venous hypertension (idiopathic) with inflammation of right lower extremity: Secondary | ICD-10-CM | POA: Diagnosis not present

## 2021-12-03 DIAGNOSIS — I110 Hypertensive heart disease with heart failure: Secondary | ICD-10-CM | POA: Insufficient documentation

## 2021-12-03 DIAGNOSIS — L97212 Non-pressure chronic ulcer of right calf with fat layer exposed: Secondary | ICD-10-CM | POA: Diagnosis not present

## 2021-12-03 DIAGNOSIS — I509 Heart failure, unspecified: Secondary | ICD-10-CM | POA: Diagnosis not present

## 2021-12-03 DIAGNOSIS — I2510 Atherosclerotic heart disease of native coronary artery without angina pectoris: Secondary | ICD-10-CM | POA: Diagnosis not present

## 2021-12-03 DIAGNOSIS — E1140 Type 2 diabetes mellitus with diabetic neuropathy, unspecified: Secondary | ICD-10-CM | POA: Diagnosis not present

## 2021-12-03 DIAGNOSIS — M069 Rheumatoid arthritis, unspecified: Secondary | ICD-10-CM | POA: Insufficient documentation

## 2021-12-03 DIAGNOSIS — J449 Chronic obstructive pulmonary disease, unspecified: Secondary | ICD-10-CM | POA: Diagnosis not present

## 2021-12-03 DIAGNOSIS — Z9981 Dependence on supplemental oxygen: Secondary | ICD-10-CM | POA: Diagnosis not present

## 2021-12-03 NOTE — Progress Notes (Signed)
VARINA, HULON ([id number]) Visit Report for 12/03/2021 Chief Complaint Document Details Patient Name: Date of Service: Alexandria [name] 12/03/2021 1:15 PM Medical Record Number: [id number] Patient Account Number: [id number] Date of Birth/Sex: Treating RN: October 19, 1935 (86 y.o. [name] Primary Care Provider: Leonides Cave Other Clinician: Referring Provider: Treating Provider/Extender: Lina Sayre, Purcell Mouton Weeks in Treatment: 0 Information Obtained from: Patient Chief Complaint 12/03/2021; patient is here for review of a traumatic wound on the right posterior calf Electronic Signature(s) Signed: 12/03/2021 4:39:24 PM By: Linton Ham MD Entered By: Linton Ham on 12/03/2021 14:27:31 -------------------------------------------------------------------------------- HPI Details Patient Name: Date of Service: Alexandria [name], Alexandria [name] 12/03/2021 1:15 PM Medical Record Number: [id number] Patient Account Number: [id number] Date of Birth/Sex: Treating RN: January 19, 1936 (86 y.o. [name] Primary Care Provider: Leonides Cave Other Clinician: Referring Provider: Treating Provider/Extender: Lina Sayre, Purcell Mouton Weeks in Treatment: 0 History of Present Illness HPI Description: ADMISSION 12/03/2021 This is a 86 year old woman who arrives in clinic for review of a traumatic area on her right posterior calf that happened about 2 weeks ago. She says she did this on the foot rest of the wheelchair. She was seen in the ED on 11/23/2021 and was given a prescription for doxycycline. She normally wears compression stockings but stopped wearing them "because of the wound". She has had increasing edema in the right leg since then to the point where the wound is weeping edema. She has been using Xeroform that she had leftover. From previous wounds on her legs. I note that she was previously seen in [place] wound care center in 2022 at that point felt  to have chronic venous insufficiency and lymphedema Past medical history is really quite extensive she has type 2 diabetes with neuropathy but very well controlled with a hemoglobin A1c of 5.8, COPD on chronic oxygen, hyperlipidemia, hypothyroidism, history of right upper lobe lung Alexandria, iron deficiency anemia, gastroesophageal reflux disease/esophagitis, she has been seen in our sister clinic in [place] in the past ABI in our clinic was 1.08. She had an echocardiogram done this year that was reasonably unremarkable. She also had a DVT rule out study that was negative in April of this year Electronic Signature(s) Signed: 12/03/2021 4:39:24 PM By: Linton Ham MD Entered By: Linton Ham on 12/03/2021 14:33:15 -------------------------------------------------------------------------------- Physical Exam Details Patient Name: Date of Service: Alexandria [name], Alexandria [name] 12/03/2021 1:15 PM Medical Record Number: [id number] Patient Account Number: [id number] Date of Birth/Sex: Treating RN: January 06, 1936 (86 y.o. [name] Primary Care Provider: Leonides Cave Other Clinician: Referring Provider: Treating Provider/Extender: Earnest Conroy Weeks in Treatment: 0 Constitutional Patient is hypertensive.. Pulse regular and within target range for patient.Marland Kitchen Respirations regular, non-labored and within target range.. Temperature is normal and within the target range for the patient.Marland Kitchen Appears in no distress. Respiratory work of breathing is normal. 02 on. Cardiovascular JVP is elevated at 45 degrees. Pansystolic murmur no sacral edema. Edema present in both extremities.Which is tense. Pitting. Notes Wound exam; small punched out area on the right posterior medial calf. Weeping edema fluid no evidence of surrounding infection Electronic Signature(s) Signed: 12/03/2021 4:39:24 PM By: Linton Ham MD Entered By: Linton Ham on 12/03/2021  14:34:45 -------------------------------------------------------------------------------- Physician Orders Details Patient Name: Date of Service: Alexandria [name], Alexandria [name] 12/03/2021 1:15 PM Medical Record Number: [id number] Patient Account Number: [id number] Date of Birth/Sex: Treating RN: 11/06/1935 (86 y.o. [name] Primary Care  Provider: Leonides Cave Other Clinician: Referring Provider: Treating Provider/Extender: Earnest Conroy Weeks in Treatment: 0 Verbal / Phone Orders: No Diagnosis Coding Follow-up Appointments ppointment in 1 week. - with Dr. Heber [place] and Leveda Anna, RN (Room 7) Return A Other: - T Lasix ake Anesthetic (In clinic) Topical Lidocaine 5% applied to wound bed (In clinic) Topical Lidocaine 4% applied to wound bed Bathing/ Shower/ Hygiene May shower with protection but do not get wound dressing(s) wet. - Can shower with cast protector bag from [facility] or [employer] Edema Control - Lymphedema / SCD / Other Elevate legs to the level of the heart or above for 30 minutes daily and/or when sitting, a frequency of: - throughout the day Avoid standing for long periods of time. Additional Orders / Instructions Follow Nutritious Diet Wound Treatment Wound #1 - Lower Leg Wound Laterality: Right, Posterior Cleanser: Soap and Water 1 x Per Week Discharge Instructions: May shower and wash wound with dial antibacterial soap and water prior to dressing change. Cleanser: Wound Cleanser 1 x Per Week Discharge Instructions: Cleanse the wound with wound cleanser prior to applying a clean dressing using gauze sponges, not tissue or cotton balls. Peri-Wound Care: Triamcinolone 15 (g) 1 x Per Week Discharge Instructions: Use triamcinolone 15 (g) as directed Peri-Wound Care: Sween Lotion (Moisturizing lotion) 1 x Per Week Discharge Instructions: Apply moisturizing lotion as directed Prim Dressing: Iodosorb Gel 10 (gm) Tube 1 x Per Week ary Discharge  Instructions: Apply to wound bed as instructed Secondary Dressing: ABD Pad, 8x10 1 x Per Week Discharge Instructions: Apply over primary dressing as directed. Compression Wrap: ThreePress (3 layer compression wrap) 1 x Per Week Discharge Instructions: Apply three layer compression as directed. Electronic Signature(s) Signed: 12/03/2021 4:39:24 PM By: Linton Ham MD Signed: 12/03/2021 5:28:59 PM By: Lorrin Jackson Entered By: Lorrin Jackson on 12/03/2021 14:17:50 -------------------------------------------------------------------------------- Problem List Details Patient Name: Date of Service: Alexandria [name], Alexandria [name] 12/03/2021 1:15 PM Medical Record Number: [id number] Patient Account Number: [id number] Date of Birth/Sex: Treating RN: 1935-12-25 (86 y.o. [name] Primary Care Provider: Leonides Cave Other Clinician: Referring Provider: Treating Provider/Extender: Lina Sayre, Purcell Mouton Weeks in Treatment: 0 Active Problems ICD-10 Encounter Code Description Active Date MDM Diagnosis S81.811D Laceration without foreign body, right lower leg, subsequent encounter 12/03/2021 No Yes L97.818 Non-pressure chronic ulcer of other part of right lower leg with other specified 12/03/2021 No Yes severity I87.321 Chronic venous hypertension (idiopathic) with inflammation of right lower 12/03/2021 No Yes extremity Inactive Problems Resolved Problems Electronic Signature(s) Signed: 12/03/2021 4:39:24 PM By: Linton Ham MD Entered By: Linton Ham on 12/03/2021 14:27:03 -------------------------------------------------------------------------------- Progress Note Details Patient Name: Date of Service: Alexandria [name], Alexandria [name] 12/03/2021 1:15 PM Medical Record Number: [id number] Patient Account Number: [id number] Date of Birth/Sex: Treating RN: 10/16/1935 (86 y.o. [name] Primary Care Provider: Leonides Cave Other Clinician: Referring  Provider: Treating Provider/Extender: Lina Sayre, Purcell Mouton Weeks in Treatment: 0 Subjective Chief Complaint Information obtained from Patient 12/03/2021; patient is here for review of a traumatic wound on the right posterior calf History of Present Illness (HPI) ADMISSION 12/03/2021 This is a 86 year old woman who arrives in clinic for review of a traumatic area on her right posterior calf that happened about 2 weeks ago. She says she did this on the foot rest of the wheelchair. She was seen in the ED on 11/23/2021 and was given a prescription for doxycycline. She normally wears compression stockings but  stopped wearing them "because of the wound". She has had increasing edema in the right leg since then to the point where the wound is weeping edema. She has been using Xeroform that she had leftover. From previous wounds on her legs. I note that she was previously seen in [place] wound care center in 2022 at that point felt to have chronic venous insufficiency and lymphedema Past medical history is really quite extensive she has type 2 diabetes with neuropathy but very well controlled with a hemoglobin A1c of 5.8, COPD on chronic oxygen, hyperlipidemia, hypothyroidism, history of right upper lobe lung Alexandria, iron deficiency anemia, gastroesophageal reflux disease/esophagitis, she has been seen in our sister clinic in [place] in the past ABI in our clinic was 1.08. She had an echocardiogram done this year that was reasonably unremarkable. She also had a DVT rule out study that was negative in April of this year Patient History Information obtained from Patient. Allergies No Known Allergies Family History Cancer - Mother, Heart Disease - Mother, Lung Disease - Father, Thyroid Problems - Siblings, No family history of Diabetes, Hereditary Spherocytosis, Hypertension, Kidney Disease, Seizures, Stroke, Tuberculosis. Social History Former smoker - 50 years - ended on 03/08/2005,  Marital Status - Widowed, Alcohol Use - Never, Drug Use - No History, Caffeine Use - Daily. Medical History Eyes Patient has history of Cataracts Denies history of Glaucoma, Optic Neuritis Ear/Nose/Mouth/Throat Patient has history of Chronic sinus problems/congestion Hematologic/Lymphatic Patient has history of Anemia Denies history of Hemophilia, Human Immunodeficiency Virus, Lymphedema, Sickle Cell Disease Respiratory Patient has history of Chronic Obstructive Pulmonary Disease (COPD) Denies history of Aspiration, Asthma, Pneumothorax, Sleep Apnea, Tuberculosis Cardiovascular Patient has history of Congestive Heart Failure, Coronary Artery Disease Denies history of Angina, Arrhythmia, Deep Vein Thrombosis, Hypertension, Hypotension, Myocardial Infarction, Peripheral Arterial Disease, Peripheral Venous Disease, Phlebitis, Vasculitis Gastrointestinal Denies history of Cirrhosis , Colitis, Crohnoos, Hepatitis A, Hepatitis B, Hepatitis C Endocrine Denies history of Type II Diabetes Genitourinary Denies history of End Stage Renal Disease Immunological Denies history of Lupus Erythematosus, Raynaudoos, Scleroderma Integumentary (Skin) Denies history of History of Burn Musculoskeletal Patient has history of Rheumatoid Arthritis Denies history of Gout, Osteoarthritis, Osteomyelitis Neurologic Patient has history of Neuropathy Denies history of Dementia, Quadriplegia, Paraplegia, Seizure Disorder Oncologic Patient has history of Received Radiation Denies history of Received Chemotherapy Psychiatric Denies history of Anorexia/bulimia, Confinement Anxiety Hospitalization/Surgery History - hystertomy. - both shoulders replaced. - C-section x 2. Medical A Surgical History Notes nd Cardiovascular Heart mummer Endocrine Prediabetic Psychiatric anxiety Review of Systems (ROS) Constitutional Symptoms (General Health) Denies complaints or symptoms of Fatigue, Fever, Chills, Marked  Weight Change. Eyes Complains or has symptoms of Glasses / Contacts. Denies complaints or symptoms of Dry Eyes, Vision Changes. Ear/Nose/Mouth/Throat Denies complaints or symptoms of Chronic sinus problems or rhinitis. Respiratory Denies complaints or symptoms of Chronic or frequent coughs, Shortness of Breath. Gastrointestinal Denies complaints or symptoms of Frequent diarrhea, Nausea, Vomiting. Endocrine Denies complaints or symptoms of Heat/cold intolerance. Genitourinary Denies complaints or symptoms of Frequent urination. Integumentary (Skin) Complains or has symptoms of Wounds - right lower leg. Psychiatric Denies complaints or symptoms of Claustrophobia. Objective Constitutional Patient is hypertensive.. Pulse regular and within target range for patient.Marland Kitchen Respirations regular, non-labored and within target range.. Temperature is normal and within the target range for the patient.Marland Kitchen Appears in no distress. Vitals Time Taken: 1:21 PM, Height: 65 in, Source: Stated, Weight: 219.5 lbs, Source: Stated, BMI: 36.5, Temperature: 1323 F, Pulse: 88 bpm, Respiratory Rate: 20 breaths/min, Blood  Pressure: 156/81 mmHg. Respiratory work of breathing is normal. 02 on. Cardiovascular JVP is elevated at 45 degrees. Pansystolic murmur no sacral edema. Edema present in both extremities.Which is tense. Pitting. General Notes: Wound exam; small punched out area on the right posterior medial calf. Weeping edema fluid no evidence of surrounding infection Integumentary (Hair, Skin) Wound #1 status is Open. Original cause of wound was Trauma. The date acquired was: 11/22/2021. The wound is located on the Right,Posterior Lower Leg. The wound measures 0.8cm length x 0.7cm width x 0.4cm depth; 0.44cm^2 area and 0.176cm^3 volume. There is Fat Layer (Subcutaneous Tissue) exposed. There is no tunneling or undermining noted. There is a medium amount of serosanguineous drainage noted. The wound margin is  distinct with the outline attached to the wound base. There is medium (34-66%) red, pink granulation within the wound bed. There is a small (1-33%) amount of necrotic tissue within the wound bed including Eschar and Adherent Slough. Assessment Active Problems ICD-10 Laceration without foreign body, right lower leg, subsequent encounter Non-pressure chronic ulcer of other part of right lower leg with other specified severity Chronic venous hypertension (idiopathic) with inflammation of right lower extremity Procedures Wound #1 Pre-procedure diagnosis of Wound #1 is a Venous Leg Ulcer located on the Right,Posterior Lower Leg . There was a Three Layer Compression Therapy Procedure by Lorrin Jackson, RN. Post procedure Diagnosis Wound #1: Same as Pre-Procedure Plan Follow-up Appointments: Return Appointment in 1 week. - with Dr. Heber [place] and Leveda Anna, RN (Room 7) Other: - T Lasix ake Anesthetic: (In clinic) Topical Lidocaine 5% applied to wound bed (In clinic) Topical Lidocaine 4% applied to wound bed Bathing/ Shower/ Hygiene: May shower with protection but do not get wound dressing(s) wet. - Can shower with cast protector bag from [facility], [facility] or [employer] Edema Control - Lymphedema / SCD / Other: Elevate legs to the level of the heart or above for 30 minutes daily and/or when sitting, a frequency of: - throughout the day Avoid standing for long periods of time. Additional Orders / Instructions: Follow Nutritious Diet WOUND #1: - Lower Leg Wound Laterality: Right, Posterior Cleanser: Soap and Water 1 x Per Week/ Discharge Instructions: May shower and wash wound with dial antibacterial soap and water prior to dressing change. Cleanser: Wound Cleanser 1 x Per Week/ Discharge Instructions: Cleanse the wound with wound cleanser prior to applying a clean dressing using gauze sponges, not tissue or cotton balls. Peri-Wound Care: Triamcinolone 15 (g) 1 x Per Week/ Discharge Instructions: Use  triamcinolone 15 (g) as directed Peri-Wound Care: Sween Lotion (Moisturizing lotion) 1 x Per Week/ Discharge Instructions: Apply moisturizing lotion as directed Prim Dressing: Iodosorb Gel 10 (gm) Tube 1 x Per Week/ ary Discharge Instructions: Apply to wound bed as instructed Secondary Dressing: ABD Pad, 8x10 1 x Per Week/ Discharge Instructions: Apply over primary dressing as directed. Com pression Wrap: ThreePress (3 layer compression wrap) 1 x Per Week/ Discharge Instructions: Apply three layer compression as directed. 1. Traumatic wound on the right leg as described this is small with the surface of questionable viability however I elected not to debride this as it is already weeping edema fluid 2. I suspect her edema is multifactorial certainly may have chronic venous insufficiency/lyphedema but I also suspect some degree of systemic fluid overload although I note her reasonably normal right ventricular function on an echocardiogram earlier this year. She has Lasix ordered 1-2 as needed 20 mg I have asked her to take 2 of these for the next 3  days 3. I will use Iodoflex as the primary dressing 3 layer compression. I am going to try to keep her leg elevated. She may require debridement when she comes in next week. Electronic Signature(s) Signed: 12/03/2021 4:39:24 PM By: Linton Ham MD Entered By: Linton Ham on 12/03/2021 14:36:41 -------------------------------------------------------------------------------- HxROS Details Patient Name: Date of Service: Alexandria [name], Alexandria [name] 12/03/2021 1:15 PM Medical Record Number: [id number] Patient Account Number: [id number] Date of Birth/Sex: Treating RN: 10/15/1935 (86 y.o. [name] Primary Care Provider: Leonides Cave Other Clinician: Referring Provider: Treating Provider/Extender: Earnest Conroy Weeks in Treatment: 0 Information Obtained From Patient Constitutional Symptoms (General  Health) Complaints and Symptoms: Negative for: Fatigue; Fever; Chills; Marked Weight Change Eyes Complaints and Symptoms: Positive for: Glasses / Contacts Negative for: Dry Eyes; Vision Changes Medical History: Positive for: Cataracts Negative for: Glaucoma; Optic Neuritis Ear/Nose/Mouth/Throat Complaints and Symptoms: Negative for: Chronic sinus problems or rhinitis Medical History: Positive for: Chronic sinus problems/congestion Respiratory Complaints and Symptoms: Negative for: Chronic or frequent coughs; Shortness of Breath Medical History: Positive for: Chronic Obstructive Pulmonary Disease (COPD) Negative for: Aspiration; Asthma; Pneumothorax; Sleep Apnea; Tuberculosis Gastrointestinal Complaints and Symptoms: Negative for: Frequent diarrhea; Nausea; Vomiting Medical History: Negative for: Cirrhosis ; Colitis; Crohns; Hepatitis A; Hepatitis B; Hepatitis C Endocrine Complaints and Symptoms: Negative for: Heat/cold intolerance Medical History: Negative for: Type II Diabetes Past Medical History Notes: Prediabetic Genitourinary Complaints and Symptoms: Negative for: Frequent urination Medical History: Negative for: End Stage Renal Disease Integumentary (Skin) Complaints and Symptoms: Positive for: Wounds - right lower leg Medical History: Negative for: History of Burn Psychiatric Complaints and Symptoms: Negative for: Claustrophobia Medical History: Negative for: Anorexia/bulimia; Confinement Anxiety Past Medical History Notes: anxiety Hematologic/Lymphatic Medical History: Positive for: Anemia Negative for: Hemophilia; Human Immunodeficiency Virus; Lymphedema; Sickle Cell Disease Cardiovascular Medical History: Positive for: Congestive Heart Failure; Coronary Artery Disease Negative for: Angina; Arrhythmia; Deep Vein Thrombosis; Hypertension; Hypotension; Myocardial Infarction; Peripheral Arterial Disease; Peripheral Venous Disease; Phlebitis;  Vasculitis Past Medical History Notes: Heart mummer Immunological Medical History: Negative for: Lupus Erythematosus; Raynauds; Scleroderma Musculoskeletal Medical History: Positive for: Rheumatoid Arthritis Negative for: Gout; Osteoarthritis; Osteomyelitis Neurologic Medical History: Positive for: Neuropathy Negative for: Dementia; Quadriplegia; Paraplegia; Seizure Disorder Oncologic Medical History: Positive for: Received Radiation Negative for: Received Chemotherapy HBO Extended History Items Ear/Nose/Mouth/Throat: Eyes: Chronic sinus Cataracts problems/congestion Immunizations Pneumococcal Vaccine: Received Pneumococcal Vaccination: Yes Received Pneumococcal Vaccination On or After 60th Birthday: Yes Implantable Devices None Hospitalization / Surgery History Type of Hospitalization/Surgery hystertomy both shoulders replaced C-section x 2 Family and Social History Cancer: Yes - Mother; Diabetes: No; Heart Disease: Yes - Mother; Hereditary Spherocytosis: No; Hypertension: No; Kidney Disease: No; Lung Disease: Yes - Father; Seizures: No; Stroke: No; Thyroid Problems: Yes - Siblings; Tuberculosis: No; Former smoker - 26 years - ended on 03/08/2005; Marital Status - Widowed; Alcohol Use: Never; Drug Use: No History; Caffeine Use: Daily; Financial Concerns: No; Food, Clothing or Shelter Needs: No; Support System Lacking: No; Transportation Concerns: No Electronic Signature(s) Signed: 12/03/2021 4:22:00 PM By: Erenest Blank Signed: 12/03/2021 4:39:24 PM By: Linton Ham MD Signed: 12/03/2021 5:28:59 PM By: Lorrin Jackson Entered By: Erenest Blank on 12/03/2021 13:44:37 -------------------------------------------------------------------------------- SuperBill Details Patient Name: Date of Service: Alexandria [name], Alexandria [name] 12/03/2021 Medical Record Number: [id number] Patient Account Number: [id number] Date of Birth/Sex: Treating RN: Jan 29, 1936 (86 y.o. [name] Primary Care Provider: Leonides Cave Other Clinician: Referring Provider: Treating Provider/Extender: Earnest Conroy  Weeks in Treatment: 0 Diagnosis Coding ICD-10 Codes Code Description S81.811D Laceration without foreign body, right lower leg, subsequent encounter L97.818 Non-pressure chronic ulcer of other part of right lower leg with other specified severity I87.321 Chronic venous hypertension (idiopathic) with inflammation of right lower extremity Facility Procedures CPT4 Code: 99357017 Description: 79390 - WOUND CARE VISIT-LEV 3 EST PT Modifier: 25 Quantity: 1 CPT4 Code: 30092330 Description: (Facility Use Only) [id number] - [facility] LWR RT LEG Modifier: Quantity: 1 Physician Procedures : CPT4 Code Description Modifier [id number] - WC PHYS LEVEL 4 - EST PT ICD-10 Diagnosis Description S81.811D Laceration without foreign body, right lower leg, subsequent encounter L97.818 Non-pressure chronic ulcer of other part of right lower leg  with other specified severity I87.321 Chronic venous hypertension (idiopathic) with inflammation of right lower extremity Quantity: 1 Electronic Signature(s) Signed: 12/03/2021 4:39:24 PM By: Linton Ham MD Entered By: Linton Ham on 12/03/2021 14:37:03

## 2021-12-03 NOTE — Progress Notes (Signed)
Alexand[place], [name] ([id number]) Visit Report for 12/03/2021 Allergy List Details Patient Name: Date of Service: Alexandria [name], [place] [name] 12/03/2021 1:15 PM Medical Record Number: [id number] Patient Account Number: [id number] Date of Birth/Sex: Treating RN: 07/08/1935 (86 y.o. [name] Primary Care Sharvi Mooneyhan: Leonides Cave Other Clinician: Referring Juleah Paradise: Treating Antonious Omahoney/Extender: Earnest Conroy Weeks in Treatment: 0 Allergies Active Allergies No Known Allergies Allergy Notes Electronic Signature(s) Signed: 12/03/2021 4:22:00 PM By: Erenest Blank Entered By: Erenest Blank on 12/03/2021 13:24:37 -------------------------------------------------------------------------------- Arrival Information Details Patient Name: Date of Service: [place] [name], [place] [name] 12/03/2021 1:15 PM Medical Record Number: [id number] Patient Account Number: [id number] Date of Birth/Sex: Treating RN: 1935/05/23 (86 y.o. [name] Primary Care Kyrel Leighton: Leonides Cave Other Clinician: Referring Hasana Alcorta: Treating Lexington Devine/Extender: Earnest Conroy Weeks in Treatment: 0 Visit Information Patient Arrived: Charlyn Minerva Time: 13:20 Accompanied By: self Transfer Assistance: None Patient Identification Verified: Yes Secondary Verification Process Completed: Yes Patient Requires Transmission-Based Precautions: No Patient Has Alerts: No Electronic Signature(s) Signed: 12/03/2021 4:22:00 PM By: Erenest Blank Entered By: Erenest Blank on 12/03/2021 13:21:15 -------------------------------------------------------------------------------- Clinic Level of Care Assessment Details Patient Name: Date of Service: [place] [name], [place] [name] 12/03/2021 1:15 PM Medical Record Number: [id number] Patient Account Number: [id number] Date of Birth/Sex: Treating RN: 12-Dec-1935 (86 y.o. [name] Primary Care Edison Nicholson: Leonides Cave Other  Clinician: Referring Loran Fleet: Treating Alston Berrie/Extender: Earnest Conroy Weeks in Treatment: 0 Clinic Level of Care Assessment Items TOOL 1 Quantity Score X- 1 0 Use when EandM and Procedure is performed on INITIAL visit ASSESSMENTS - Nursing Assessment / Reassessment X- 1 20 General Physical Exam (combine w/ comprehensive assessment (listed just below) when performed on new pt. evals) X- 1 25 Comprehensive Assessment (HX, ROS, Risk Assessments, Wounds Hx, etc.) ASSESSMENTS - Wound and Skin Assessment / Reassessment []  - 0 Dermatologic / Skin Assessment (not related to wound area) ASSESSMENTS - Ostomy and/or Continence Assessment and Care []  - 0 Incontinence Assessment and Management []  - 0 Ostomy Care Assessment and Management (repouching, etc.) PROCESS - Coordination of Care []  - 0 Simple Patient / Family Education for ongoing care X- 1 20 Complex (extensive) Patient / Family Education for ongoing care X- 1 10 Staff obtains Programmer, systems, Records, T Results / Process Orders est []  - 0 Staff telephones HHA, Nursing Homes / Clarify orders / etc []  - 0 Routine Transfer to another Facility (non-emergent condition) []  - 0 Routine Hospital Admission (non-emergent condition) []  - 0 New Admissions / Biomedical engineer / Ordering NPWT Apligraf, etc. , []  - 0 Emergency Hospital Admission (emergent condition) PROCESS - Special Needs []  - 0 Pediatric / Minor Patient Management []  - 0 Isolation Patient Management []  - 0 Hearing / Language / Visual special needs []  - 0 Assessment of Community assistance (transportation, D/C planning, etc.) []  - 0 Additional assistance / Altered mentation []  - 0 Support Surface(s) Assessment (bed, cushion, seat, etc.) INTERVENTIONS - Miscellaneous []  - 0 External ear exam []  - 0 Patient Transfer (multiple staff / Civil Service fast streamer / Similar devices) []  - 0 Simple Staple / Suture removal (25 or less) []  - 0 Complex  Staple / Suture removal (26 or more) []  - 0 Hypo/Hyperglycemic Management (do not check if billed separately) X- 1 15 Ankle / Brachial Index (ABI) - do not check if billed separately Has the patient been seen at the hospital within the last three years: Yes Total Score:  90 Level Of Care: New/Established - Level 3 Electronic Signature(s) Signed: 12/03/2021 5:28:59 PM By: Lorrin Jackson Entered By: Lorrin Jackson on 12/03/2021 14:18:19 -------------------------------------------------------------------------------- Compression Therapy Details Patient Name: Date of Service: [place] [name], [place] [name] 12/03/2021 1:15 PM Medical Record Number: [id number] Patient Account Number: [id number] Date of Birth/Sex: Treating RN: 11-15-1935 (86 y.o. [name] Primary Care Honour Schwieger: Leonides Cave Other Clinician: Referring Illene Sweeting: Treating Alper Guilmette/Extender: Earnest Conroy Weeks in Treatment: 0 Compression Therapy Performed for Wound Assessment: Wound #1 Right,Posterior Lower Leg Performed By: Clinician Lorrin Jackson, RN Compression Type: Three Layer Post Procedure Diagnosis Same as Pre-procedure Electronic Signature(s) Signed: 12/03/2021 5:28:59 PM By: Lorrin Jackson Entered By: Lorrin Jackson on 12/03/2021 14:14:10 -------------------------------------------------------------------------------- Encounter Discharge Information Details Patient Name: Date of Service: [place] [name], [place] [name] 12/03/2021 1:15 PM Medical Record Number: [id number] Patient Account Number: [id number] Date of Birth/Sex: Treating RN: August 28, 1935 (86 y.o. [name] Primary Care Leisa Gault: Leonides Cave Other Clinician: Referring Tavien Chestnut: Treating Valentin Benney/Extender: Earnest Conroy Weeks in Treatment: 0 Encounter Discharge Information Items Discharge Condition: Stable Ambulatory Status: Walker Discharge Destination: Home Transportation: Private  Auto Schedule Follow-up Appointment: Yes Clinical Summary of Care: Provided on 12/03/2021 Form Type Recipient Paper Patient Patient Electronic Signature(s) Signed: 12/03/2021 5:28:59 PM By: Lorrin Jackson Entered By: Lorrin Jackson on 12/03/2021 14:19:16 -------------------------------------------------------------------------------- Lower Extremity Assessment Details Patient Name: Date of Service: [place] [name], [place] [name] 12/03/2021 1:15 PM Medical Record Number: [id number] Patient Account Number: [id number] Date of Birth/Sex: Treating RN: 04-24-1935 (86 y.o. [name] Primary Care Davide Risdon: Leonides Cave Other Clinician: Referring Dannelle Rhymes: Treating Karas Pickerill/Extender: Earnest Conroy Weeks in Treatment: 0 Edema Assessment Assessed: [name]: No] [name]: Yes] Edema: [Left: Ye] [Right: s] Calf Left: Right: Point of Measurement: 29 cm From Medial Instep 43.7 cm Ankle Left: Right: Point of Measurement: 9 cm From Medial Instep 26.5 cm Knee To Floor Left: Right: From Medial Instep 38 cm Vascular Assessment Pulses: Dorsalis Pedis Palpable: [Right:Yes] Doppler Audible: [Right:Yes] Blood Pressure: Brachial: [Right:156] Ankle: [Right:Dorsalis Pedis: 168 1.08] Electronic Signature(s) Signed: 12/03/2021 5:28:59 PM By: Lorrin Jackson Entered By: Lorrin Jackson on 12/03/2021 14:00:06 -------------------------------------------------------------------------------- Multi Wound Chart Details Patient Name: Date of Service: [place] [name], [place] [name] 12/03/2021 1:15 PM Medical Record Number: [id number] Patient Account Number: [id number] Date of Birth/Sex: Treating RN: [birth year] (86 y.o. [name] Primary Care Ghina Bittinger: Leonides Cave Other Clinician: Referring Damar Petit: Treating Ayan Yankey/Extender: Earnest Conroy Weeks in Treatment: 0 Vital Signs Height(in): 65 Pulse(bpm): 48 Weight(lbs): 219.5 Blood Pressure(mmHg):  156/81 Body Mass Index(BMI): 36.5 Temperature(F): 0539 Respiratory Rate(breaths/min): 20 Photos: [N/A:N/A] Right, Posterior Lower Leg N/A N/A Wound Location: Trauma N/A N/A Wounding Event: Venous Leg Ulcer N/A N/A Primary Etiology: Cataracts, Chronic sinus N/A N/A Comorbid History: problems/congestion, Anemia, Chronic Obstructive Pulmonary Disease (COPD), Congestive Heart Failure, Coronary Artery Disease, Rheumatoid Arthritis, Neuropathy, Received Radiation 11/22/2021 N/A N/A Date Acquired: 0 N/A N/A Weeks of Treatment: Open N/A N/A Wound Status: No N/A N/A Wound Recurrence: 0.8x0.7x0.4 N/A N/A Measurements L x W x D (cm) 0.44 N/A N/A A (cm) : rea 0.176 N/A N/A Volume (cm) : Full Thickness Without Exposed N/A N/A Classification: Support Structures Medium N/A N/A Exudate A mount: Serosanguineous N/A N/A Exudate Type: red, brown N/A N/A Exudate Color: Distinct, outline attached N/A N/A Wound Margin: Medium (34-66%) N/A N/A Granulation Amount: Red, Pink N/A N/A Granulation Quality: Small (1-33%) N/A N/A Necrotic Amount: Eschar, Adherent  Slough N/A N/A Necrotic Tissue: Fat Layer (Subcutaneous Tissue): Yes N/A N/A Exposed Structures: Fascia: No Tendon: No Muscle: No Joint: No Bone: No None N/A N/A Epithelialization: Compression Therapy N/A N/A Procedures Performed: Treatment Notes Wound #1 (Lower Leg) Wound Laterality: Right, Posterior Cleanser Soap and Water Discharge Instruction: May shower and wash wound with dial antibacterial soap and water prior to dressing change. Wound Cleanser Discharge Instruction: Cleanse the wound with wound cleanser prior to applying a clean dressing using gauze sponges, not tissue or cotton balls. Peri-Wound Care Triamcinolone 15 (g) Discharge Instruction: Use triamcinolone 15 (g) as directed Sween Lotion (Moisturizing lotion) Discharge Instruction: Apply moisturizing lotion as directed Topical Primary  Dressing Iodosorb Gel 10 (gm) Tube Discharge Instruction: Apply to wound bed as instructed Secondary Dressing ABD Pad, 8x10 Discharge Instruction: Apply over primary dressing as directed. Secured With Compression Wrap ThreePress (3 layer compression wrap) Discharge Instruction: Apply three layer compression as directed. Compression Stockings Add-Ons Electronic Signature(s) Signed: 12/03/2021 4:39:24 PM By: Linton Ham MD Signed: 12/03/2021 5:28:59 PM By: Lorrin Jackson Entered By: Linton Ham on 12/03/2021 14:27:08 -------------------------------------------------------------------------------- Multi-Disciplinary Care Plan Details Patient Name: Date of Service: [place] [name], [place] [name] 12/03/2021 1:15 PM Medical Record Number: [id number] Patient Account Number: [id number] Date of Birth/Sex: Treating RN: Aug 13, 1935 (86 y.o. [name] Primary Care Norberta Stobaugh: Leonides Cave Other Clinician: Referring Cormick Moss: Treating Heiley Shaikh/Extender: Earnest Conroy Weeks in Treatment: 0 Active Inactive Venous Leg Ulcer Nursing Diagnoses: Actual venous Insuffiency (use after diagnosis is confirmed) Goals: Patient will maintain optimal edema control Date Initiated: 12/03/2021 Target Resolution Date: 12/31/2021 Goal Status: Active Interventions: Assess peripheral edema status every visit. Compression as ordered Provide education on venous insufficiency Treatment Activities: Therapeutic compression applied : 12/03/2021 Notes: Wound/Skin Impairment Nursing Diagnoses: Impaired tissue integrity Goals: Patient/caregiver will verbalize understanding of skin care regimen Date Initiated: 12/03/2021 Target Resolution Date: 12/31/2021 Goal Status: Active Ulcer/skin breakdown will have a volume reduction of 30% by week 4 Date Initiated: 12/03/2021 Target Resolution Date: 12/31/2021 Goal Status: Active Interventions: Assess patient/caregiver ability to obtain  necessary supplies Assess patient/caregiver ability to perform ulcer/skin care regimen upon admission and as needed Assess ulceration(s) every visit Provide education on ulcer and skin care Treatment Activities: Topical wound management initiated : 12/03/2021 Notes: Electronic Signature(s) Signed: 12/03/2021 5:28:59 PM By: Lorrin Jackson Entered By: Lorrin Jackson on 12/03/2021 14:01:20 -------------------------------------------------------------------------------- Pain Assessment Details Patient Name: Date of Service: [place] [name], [place] [name] 12/03/2021 1:15 PM Medical Record Number: [id number] Patient Account Number: [id number] Date of Birth/Sex: Treating RN: August 15, 1935 (86 y.o. [name] Primary Care Nikeia Henkes: Leonides Cave Other Clinician: Referring Zaylie Gisler: Treating Owais Pruett/Extender: Earnest Conroy Weeks in Treatment: 0 Active Problems Location of Pain Severity and Description of Pain Patient Has Paino No Site Locations Pain Management and Medication Current Pain Management: Electronic Signature(s) Signed: 12/03/2021 5:28:59 PM By: Lorrin Jackson Entered By: Lorrin Jackson on 12/03/2021 13:51:03 -------------------------------------------------------------------------------- Patient/Caregiver Education Details Patient Name: Date of Service: Alexandria [name], [place] [name] 9/28/2023andnbsp1:15 PM Medical Record Number: [id number] Patient Account Number: [id number] Date of Birth/Gender: Treating RN: September 26, 1935 (86 y.o. [name] Primary Care Physician: Leonides Cave Other Clinician: Referring Physician: Treating Physician/Extender: Veneta Penton in Treatment: 0 Education Assessment Education Provided To: Patient Education Topics Provided Venous: Methods: Explain/Verbal, Printed Responses: State content correctly Wound/Skin Impairment: Methods: Demonstration, Explain/Verbal, Printed Responses: State  content correctly Electronic Signature(s) Signed: 12/03/2021 5:28:59 PM By: Lorrin Jackson Signed: 12/03/2021  5:28:59 PM By: Lorrin Jackson Entered By: Lorrin Jackson on 12/03/2021 14:01:35 -------------------------------------------------------------------------------- Wound Assessment Details Patient Name: Date of Service: [place] [name], [place] [name] 12/03/2021 1:15 PM Medical Record Number: [id number] Patient Account Number: [id number] Date of Birth/Sex: Treating RN: 1935/09/25 (86 y.o. [name] Primary Care Mirranda Monrroy: Leonides Cave Other Clinician: Referring Tylicia Sherman: Treating Geralda Baumgardner/Extender: Earnest Conroy Weeks in Treatment: 0 Wound Status Wound Number: 1 Primary Venous Leg Ulcer Etiology: Wound Location: Right, Posterior Lower Leg Wound Open Wounding Event: Trauma Status: Date Acquired: 11/22/2021 Comorbid Cataracts, Chronic sinus problems/congestion, Anemia, Chronic Weeks Of Treatment: 0 History: Obstructive Pulmonary Disease (COPD), Congestive Heart Failure, Clustered Wound: No Coronary Artery Disease, Rheumatoid Arthritis, Neuropathy, Received Radiation Photos Wound Measurements Length: (cm) 0.8 Width: (cm) 0.7 Depth: (cm) 0.4 Area: (cm) 0.44 Volume: (cm) 0.176 % Reduction in Area: % Reduction in Volume: Epithelialization: None Tunneling: No Undermining: No Wound Description Classification: Full Thickness Without Exposed Support Structures Wound Margin: Distinct, outline attached Exudate Amount: Medium Exudate Type: Serosanguineous Exudate Color: red, brown Foul Odor After Cleansing: No Slough/Fibrino Yes Wound Bed Granulation Amount: Medium (34-66%) Exposed Structure Granulation Quality: Red, Pink Fascia Exposed: No Necrotic Amount: Small (1-33%) Fat Layer (Subcutaneous Tissue) Exposed: Yes Necrotic Quality: Eschar, Adherent Slough Tendon Exposed: No Muscle Exposed: No Joint Exposed: No Bone Exposed: No Treatment  Notes Wound #1 (Lower Leg) Wound Laterality: Right, Posterior Cleanser Soap and Water Discharge Instruction: May shower and wash wound with dial antibacterial soap and water prior to dressing change. Wound Cleanser Discharge Instruction: Cleanse the wound with wound cleanser prior to applying a clean dressing using gauze sponges, not tissue or cotton balls. Peri-Wound Care Triamcinolone 15 (g) Discharge Instruction: Use triamcinolone 15 (g) as directed Sween Lotion (Moisturizing lotion) Discharge Instruction: Apply moisturizing lotion as directed Topical Primary Dressing Iodosorb Gel 10 (gm) Tube Discharge Instruction: Apply to wound bed as instructed Secondary Dressing ABD Pad, 8x10 Discharge Instruction: Apply over primary dressing as directed. Secured With Compression Wrap ThreePress (3 layer compression wrap) Discharge Instruction: Apply three layer compression as directed. Compression Stockings Add-Ons Electronic Signature(s) Signed: 12/03/2021 5:28:59 PM By: Lorrin Jackson Entered By: Lorrin Jackson on 12/03/2021 13:50:36 -------------------------------------------------------------------------------- Vitals Details Patient Name: Date of Service: Alexandria [name], [place] [name] 12/03/2021 1:15 PM Medical Record Number: [id number] Patient Account Number: [id number] Date of Birth/Sex: Treating RN: 1935-10-07 (86 y.o. [name] Primary Care Chaz Mcglasson: Leonides Cave Other Clinician: Referring Maclaine Ahola: Treating Bralynn Donado/Extender: Earnest Conroy Weeks in Treatment: 0 Vital Signs Time Taken: 13:21 Temperature (F): 1323 Height (in): 65 Pulse (bpm): 88 Source: Stated Respiratory Rate (breaths/min): 20 Weight (lbs): 219.5 Blood Pressure (mmHg): 156/81 Source: Stated Reference Range: 80 - 120 mg / dl Body Mass Index (BMI): 36.5 Electronic Signature(s) Signed: 12/03/2021 4:22:00 PM By: Erenest Blank Entered By: Erenest Blank on 12/03/2021  13:24:09

## 2021-12-03 NOTE — Progress Notes (Signed)
Alexandria, [name] ([id number]) Visit Report for 12/03/2021 Abuse Risk Screen Details Patient Name: Date of Service: [place] [name], [place] [name] 12/03/2021 1:15 PM Medical Record Number: [id number] Patient Account Number: [id number] Date of Birth/Sex: Treating RN: 11/08/1935 (86 y.o. [name] Primary Care Malissa Slay: Leonides Cave Other Clinician: Referring Niema Carrara: Treating Alliya Marcon/Extender: Earnest Conroy Weeks in Treatment: 0 Abuse Risk Screen Items Answer ABUSE RISK SCREEN: Has anyone close to you tried to hurt or harm you recentlyo No Do you feel uncomfortable with anyone in your familyo No Has anyone forced you do things that you didnt want to doo No Electronic Signature(s) Signed: 12/03/2021 4:22:00 PM By: Erenest Blank Signed: 12/03/2021 5:28:59 PM By: Lorrin Jackson Entered By: Erenest Blank on 12/03/2021 13:45:02 -------------------------------------------------------------------------------- Activities of Daily Living Details Patient Name: Date of Service: [place] [name], [place] [name] 12/03/2021 1:15 PM Medical Record Number: [id number] Patient Account Number: [id number] Date of Birth/Sex: Treating RN: [birth year] (86 y.o. [name] Primary Care Cohan Stipes: Leonides Cave Other Clinician: Referring Audra Kagel: Treating Anneke Cundy/Extender: Earnest Conroy Weeks in Treatment: 0 Activities of Daily Living Items Answer Activities of Daily Living (Please select one for each item) Drive Automobile Not Able T Medications ake Completely Able Use T elephone Completely Able Care for Appearance Completely Able Use T oilet Completely Able Bath / Shower Completely Able Dress Self Completely Able Feed Self Completely Able Walk Need Assistance Get In / Out Bed Completely Able Housework Need Assistance Prepare Meals Need Assistance Handle Money Completely Able Shop for Self Need Assistance Electronic Signature(s) Signed:  12/03/2021 4:22:00 PM By: Erenest Blank Signed: 12/03/2021 5:28:59 PM By: Lorrin Jackson Entered By: Erenest Blank on 12/03/2021 13:46:53 -------------------------------------------------------------------------------- Education Screening Details Patient Name: Date of Service: [place] [name], [place] [name] 12/03/2021 1:15 PM Medical Record Number: [id number] Patient Account Number: [id number] Date of Birth/Sex: Treating RN: 1935-05-10 (86 y.o. [name] Primary Care Story Conti: Leonides Cave Other Clinician: Referring Banjamin Stovall: Treating Fredi Hurtado/Extender: Earnest Conroy Weeks in Treatment: 0 Primary Learner Assessed: Patient Learning Preferences/Education Level/Primary Language Learning Preference: Explanation, Demonstration, Printed Material Highest Education Level: High School Preferred Language: English Cognitive Barrier Language Barrier: No Translator Needed: No Memory Deficit: No Emotional Barrier: No Cultural/Religious Beliefs Affecting Medical Care: No Physical Barrier Impaired Vision: Yes Glasses Impaired Hearing: No Decreased Hand dexterity: No Knowledge/Comprehension Knowledge Level: High Comprehension Level: High Ability to understand written instructions: High Ability to understand verbal instructions: High Motivation Anxiety Level: Calm Cooperation: Cooperative Education Importance: Acknowledges Need Interest in Health Problems: Asks Questions Perception: Coherent Willingness to Engage in Self-Management High Activities: Readiness to Engage in Self-Management High Activities: Electronic Signature(s) Signed: 12/03/2021 4:22:00 PM By: Erenest Blank Signed: 12/03/2021 5:28:59 PM By: Lorrin Jackson Entered By: Erenest Blank on 12/03/2021 13:49:06 -------------------------------------------------------------------------------- Fall Risk Assessment Details Patient Name: Date of Service: [place] [name], [place] [name] 12/03/2021 1:15 PM Medical  Record Number: [id number] Patient Account Number: [id number] Date of Birth/Sex: Treating RN: 02/06/1936 (86 y.o. [name] Primary Care Stephen Turnbaugh: Leonides Cave Other Clinician: Referring Percival Glasheen: Treating Alois Colgan/Extender: Earnest Conroy Weeks in Treatment: 0 Fall Risk Assessment Items Have you had 2 or more falls in the last 12 monthso 0 Yes Have you had any fall that resulted in injury in the last 12 monthso 0 Yes FALLS RISK SCREEN History of falling - immediate or within 3 months 0 No Secondary diagnosis (Do you have 2 or more medical  diagnoseso) 0 No Ambulatory aid None/bed rest/wheelchair/nurse 0 No Crutches/cane/walker 15 Yes Furniture 0 No Intravenous therapy Access/Saline/Heparin Lock 0 No Gait/Transferring Normal/ bed rest/ wheelchair 0 No Weak (short steps with or without shuffle, stooped but able to lift head while walking, may seek 10 Yes support from furniture) Impaired (short steps with shuffle, may have difficulty arising from chair, head down, impaired 0 No balance) Mental Status Oriented to own ability 0 Yes Electronic Signature(s) Signed: 12/03/2021 4:22:00 PM By: Erenest Blank Signed: 12/03/2021 5:28:59 PM By: Lorrin Jackson Entered By: Erenest Blank on 12/03/2021 13:50:44 -------------------------------------------------------------------------------- Foot Assessment Details Patient Name: Date of Service: [place] [name], [place] [name] 12/03/2021 1:15 PM Medical Record Number: [id number] Patient Account Number: [id number] Date of Birth/Sex: Treating RN: [birth year] (86 y.o. [name] Primary Care Ordell Prichett: Leonides Cave Other Clinician: Referring Milany Geck: Treating Jaasiel Hollyfield/Extender: Earnest Conroy Weeks in Treatment: 0 Foot Assessment Items Site Locations + = Sensation present, - = Sensation absent, C = Callus, U = Ulcer R = Redness, W = Warmth, M = Maceration, PU = Pre-ulcerative lesion F  = Fissure, S = Swelling, D = Dryness Assessment Right: Left: Other Deformity: No No Prior Foot Ulcer: No No Prior Amputation: No No Charcot Joint: No No Ambulatory Status: Ambulatory With Help Assistance Device: Walker Gait: Steady Electronic Signature(s) Signed: 12/03/2021 5:28:59 PM By: Lorrin Jackson Entered By: Lorrin Jackson on 12/03/2021 13:59:49 -------------------------------------------------------------------------------- Nutrition Risk Screening Details Patient Name: Date of Service: Alexandria [name], [place] [name] 12/03/2021 1:15 PM Medical Record Number: [id number] Patient Account Number: [id number] Date of Birth/Sex: Treating RN: Jul 22, 1935 (86 y.o. [name] Primary Care Fransisco Messmer: Leonides Cave Other Clinician: Referring Bellamia Ferch: Treating Tanijah Morais/Extender: Earnest Conroy Weeks in Treatment: 0 Height (in): 65 Weight (lbs): 219.5 Body Mass Index (BMI): 36.5 Nutrition Risk Screening Items Score Screening NUTRITION RISK SCREEN: I have an illness or condition that made me change the kind and/or amount of food I eat 0 No I eat fewer than two meals per day 3 Yes I eat few fruits and vegetables, or milk products 2 Yes I have three or more drinks of beer, liquor or wine almost every day 0 No I have tooth or mouth problems that make it hard for me to eat 0 No I don't always have enough money to buy the food I need 0 No I eat alone most of the time 1 Yes I take three or more different prescribed or over-the-counter drugs a day 1 Yes Without wanting to, I have lost or gained 10 pounds in the last six months 0 No I am not always physically able to shop, cook and/or feed myself 0 No Nutrition Protocols Good Risk Protocol Moderate Risk Protocol High Risk Proctocol 0 Provide education on nutrition Risk Level: High Risk Score: 7 Electronic Signature(s) Signed: 12/03/2021 4:22:00 PM By: Erenest Blank Signed: 12/03/2021 5:28:59 PM By: Lorrin Jackson Entered By: Erenest Blank on 12/03/2021 13:51:58

## 2021-12-04 ENCOUNTER — Inpatient Hospital Stay: Payer: Medicare Other

## 2021-12-04 VITALS — BP 142/51 | HR 84 | Temp 97.6°F | Resp 18

## 2021-12-04 DIAGNOSIS — Z79899 Other long term (current) drug therapy: Secondary | ICD-10-CM | POA: Diagnosis not present

## 2021-12-04 DIAGNOSIS — D649 Anemia, unspecified: Secondary | ICD-10-CM

## 2021-12-04 DIAGNOSIS — D509 Iron deficiency anemia, unspecified: Secondary | ICD-10-CM | POA: Diagnosis not present

## 2021-12-04 DIAGNOSIS — Z87891 Personal history of nicotine dependence: Secondary | ICD-10-CM | POA: Diagnosis not present

## 2021-12-04 DIAGNOSIS — Z85118 Personal history of other malignant neoplasm of bronchus and lung: Secondary | ICD-10-CM | POA: Diagnosis not present

## 2021-12-04 MED ORDER — METHYLPREDNISOLONE SODIUM SUCC 125 MG IJ SOLR
40.0000 mg | Freq: Once | INTRAMUSCULAR | Status: AC
  Administered 2021-12-04: 40 mg via INTRAVENOUS
  Filled 2021-12-04: qty 2

## 2021-12-04 MED ORDER — SODIUM CHLORIDE 0.9 % IV SOLN
200.0000 mg | Freq: Once | INTRAVENOUS | Status: AC
  Administered 2021-12-04: 200 mg via INTRAVENOUS
  Filled 2021-12-04: qty 200

## 2021-12-04 MED ORDER — SODIUM CHLORIDE 0.9 % IV SOLN
Freq: Once | INTRAVENOUS | Status: AC
  Filled 2021-12-04: qty 250

## 2021-12-08 MED FILL — Iron Sucrose Inj 20 MG/ML (Fe Equiv): INTRAVENOUS | Qty: 10 | Status: AC

## 2021-12-09 ENCOUNTER — Inpatient Hospital Stay: Payer: Medicare Other | Attending: Internal Medicine

## 2021-12-09 VITALS — BP 155/65 | HR 65 | Temp 97.1°F | Resp 18

## 2021-12-09 DIAGNOSIS — D509 Iron deficiency anemia, unspecified: Secondary | ICD-10-CM | POA: Diagnosis not present

## 2021-12-09 DIAGNOSIS — Z87891 Personal history of nicotine dependence: Secondary | ICD-10-CM | POA: Insufficient documentation

## 2021-12-09 DIAGNOSIS — Z79899 Other long term (current) drug therapy: Secondary | ICD-10-CM | POA: Diagnosis not present

## 2021-12-09 DIAGNOSIS — Z85118 Personal history of other malignant neoplasm of bronchus and lung: Secondary | ICD-10-CM | POA: Insufficient documentation

## 2021-12-09 DIAGNOSIS — Z9981 Dependence on supplemental oxygen: Secondary | ICD-10-CM | POA: Insufficient documentation

## 2021-12-09 DIAGNOSIS — J449 Chronic obstructive pulmonary disease, unspecified: Secondary | ICD-10-CM | POA: Insufficient documentation

## 2021-12-09 DIAGNOSIS — D649 Anemia, unspecified: Secondary | ICD-10-CM

## 2021-12-09 MED ORDER — SODIUM CHLORIDE 0.9 % IV SOLN
Freq: Once | INTRAVENOUS | Status: AC
  Filled 2021-12-09: qty 250

## 2021-12-09 MED ORDER — SODIUM CHLORIDE 0.9 % IV SOLN
200.0000 mg | Freq: Once | INTRAVENOUS | Status: AC
  Administered 2021-12-09: 200 mg via INTRAVENOUS
  Filled 2021-12-09: qty 200

## 2021-12-09 MED ORDER — METHYLPREDNISOLONE SODIUM SUCC 125 MG IJ SOLR
40.0000 mg | Freq: Once | INTRAMUSCULAR | Status: AC
  Administered 2021-12-09: 40 mg via INTRAVENOUS
  Filled 2021-12-09: qty 2

## 2021-12-09 NOTE — Patient Instructions (Signed)
[facility]  Discharge Instructions: Thank you for choosing [facility] to provide your oncology and hematology care.  If you have a lab appointment with the Cancer Center, please go directly to the Cancer Center and check in at the registration area.  Wear comfortable clothing and clothing appropriate for easy access to any Portacath or PICC line.   We strive to give you quality time with your provider. You may need to reschedule your appointment if you arrive late (15 or more minutes).  Arriving late affects you and other patients whose appointments are after yours.  Also, if you miss three or more appointments without notifying the office, you may be dismissed from the clinic at the provider's discretion.      For prescription refill requests, have your pharmacy contact our office and allow 72 hours for refills to be completed.    Today you received the following chemotherapy and/or immunotherapy agents VENOFER      To help prevent nausea and vomiting after your treatment, we encourage you to take your nausea medication as directed.  BELOW ARE SYMPTOMS THAT SHOULD BE REPORTED IMMEDIATELY: *FEVER GREATER THAN 100.4 F (38 C) OR HIGHER *CHILLS OR SWEATING *NAUSEA AND VOMITING THAT IS NOT CONTROLLED WITH YOUR NAUSEA MEDICATION *UNUSUAL SHORTNESS OF BREATH *UNUSUAL BRUISING OR BLEEDING *URINARY PROBLEMS (pain or burning when urinating, or frequent urination) *BOWEL PROBLEMS (unusual diarrhea, constipation, pain near the anus) TENDERNESS IN MOUTH AND THROAT WITH OR WITHOUT PRESENCE OF ULCERS (sore throat, sores in mouth, or a toothache) UNUSUAL RASH, SWELLING OR PAIN  UNUSUAL VAGINAL DISCHARGE OR ITCHING   Items with * indicate a potential emergency and should be followed up as soon as possible or go to the Emergency Department if any problems should occur.  Please show the CHEMOTHERAPY ALERT CARD or IMMUNOTHERAPY ALERT CARD at check-in to the  Emergency Department and triage nurse.  Should you have questions after your visit or need to cancel or reschedule your appointment, please contact [facility]  [phone] and follow the prompts.  Office hours are 8:00 a.m. to 4:30 p.m. Monday - Friday. Please note that voicemails left after 4:00 p.m. may not be returned until the following business day.  We are closed weekends and major holidays. You have access to a nurse at all times for urgent questions. Please call the main number to the clinic [phone] and follow the prompts.  For any non-urgent questions, you may also contact your provider using MyChart. We now offer e-Visits for anyone 18 and older to request care online for non-urgent symptoms. For details visit [email or web address].   Also download the MyChart app! Go to the app store, search "MyChart", open the app, select [facility], and log in with your MyChart username and password.  Masks are optional in the cancer centers. If you would like for your care team to wear a mask while they are taking care of you, please let them know. For doctor visits, patients may have with them one support person who is at least 84 years old. At this time, visitors are not allowed in the infusion area.  Iron Sucrose Injection What is this medication? IRON SUCROSE (EYE ern SOO krose) treats low levels of iron (iron deficiency anemia) in people with kidney disease. Iron is a mineral that plays an important role in making red blood cells, which carry oxygen from your lungs to the rest of your body. This medicine may be   used for other purposes; ask your health care provider or pharmacist if you have questions. COMMON BRAND NAME(S): Venofer What should I tell my care team before I take this medication? They need to know if you have any of these conditions: Anemia not caused by low iron levels Heart disease High levels of iron in the blood Kidney disease Liver  disease An unusual or allergic reaction to iron, other medications, foods, dyes, or preservatives Pregnant or trying to get pregnant Breast-feeding How should I use this medication? This medication is for infusion into a vein. It is given in a hospital or clinic setting. Talk to your care team about the use of this medication in children. While this medication may be prescribed for children as young as 2 years for selected conditions, precautions do apply. Overdosage: If you think you have taken too much of this medicine contact a poison control center or emergency room at once. NOTE: This medicine is only for you. Do not share this medicine with others. What if I miss a dose? It is important not to miss your dose. Call your care team if you are unable to keep an appointment. What may interact with this medication? Do not take this medication with any of the following: Deferoxamine Dimercaprol Other iron products This medication may also interact with the following: Chloramphenicol Deferasirox This list may not describe all possible interactions. Give your health care provider a list of all the medicines, herbs, non-prescription drugs, or dietary supplements you use. Also tell them if you smoke, drink alcohol, or use illegal drugs. Some items may interact with your medicine. What should I watch for while using this medication? Visit your care team regularly. Tell your care team if your symptoms do not start to get better or if they get worse. You may need blood work done while you are taking this medication. You may need to follow a special diet. Talk to your care team. Foods that contain iron include: whole grains/cereals, dried fruits, beans, or peas, leafy green vegetables, and organ meats (liver, kidney). What side effects may I notice from receiving this medication? Side effects that you should report to your care team as soon as possible: Allergic reactions--skin rash, itching, hives,  swelling of the face, lips, tongue, or throat Low blood pressure--dizziness, feeling faint or lightheaded, blurry vision Shortness of breath Side effects that usually do not require medical attention (report to your care team if they continue or are bothersome): Flushing Headache Joint pain Muscle pain Nausea Pain, redness, or irritation at injection site This list may not describe all possible side effects. Call your doctor for medical advice about side effects. You may report side effects to FDA at 1-800-FDA-1088. Where should I keep my medication? This medication is given in a hospital or clinic and will not be stored at home. NOTE: This sheet is a summary. It may not cover all possible information. If you have questions about this medicine, talk to your doctor, pharmacist, or health care provider.  2023 Elsevier/Gold Standard (2007-04-15 00:00:00)   

## 2021-12-10 ENCOUNTER — Encounter (HOSPITAL_BASED_OUTPATIENT_CLINIC_OR_DEPARTMENT_OTHER): Payer: Medicare Other | Attending: Internal Medicine | Admitting: Internal Medicine

## 2021-12-10 DIAGNOSIS — S81811A Laceration without foreign body, right lower leg, initial encounter: Secondary | ICD-10-CM | POA: Diagnosis not present

## 2021-12-10 DIAGNOSIS — E039 Hypothyroidism, unspecified: Secondary | ICD-10-CM | POA: Diagnosis not present

## 2021-12-10 DIAGNOSIS — Z9981 Dependence on supplemental oxygen: Secondary | ICD-10-CM | POA: Diagnosis not present

## 2021-12-10 DIAGNOSIS — J449 Chronic obstructive pulmonary disease, unspecified: Secondary | ICD-10-CM | POA: Diagnosis not present

## 2021-12-10 DIAGNOSIS — L97818 Non-pressure chronic ulcer of other part of right lower leg with other specified severity: Secondary | ICD-10-CM

## 2021-12-10 DIAGNOSIS — X58XXXA Exposure to other specified factors, initial encounter: Secondary | ICD-10-CM | POA: Diagnosis not present

## 2021-12-10 DIAGNOSIS — Z85118 Personal history of other malignant neoplasm of bronchus and lung: Secondary | ICD-10-CM | POA: Diagnosis not present

## 2021-12-10 DIAGNOSIS — K2100 Gastro-esophageal reflux disease with esophagitis, without bleeding: Secondary | ICD-10-CM | POA: Insufficient documentation

## 2021-12-10 DIAGNOSIS — E1140 Type 2 diabetes mellitus with diabetic neuropathy, unspecified: Secondary | ICD-10-CM | POA: Insufficient documentation

## 2021-12-10 DIAGNOSIS — E785 Hyperlipidemia, unspecified: Secondary | ICD-10-CM | POA: Insufficient documentation

## 2021-12-10 DIAGNOSIS — I87321 Chronic venous hypertension (idiopathic) with inflammation of right lower extremity: Secondary | ICD-10-CM | POA: Insufficient documentation

## 2021-12-10 DIAGNOSIS — D509 Iron deficiency anemia, unspecified: Secondary | ICD-10-CM | POA: Insufficient documentation

## 2021-12-10 NOTE — Progress Notes (Signed)
MADISAN, BICE ([id number]) Visit Report for 12/10/2021 Arrival Information Details Patient Name: Date of Service: Alexandria [name], [place] 12/10/2021 2:30 PM Medical Record Number: [id number] Patient Account Number: [id number] Date of Birth/Sex: Treating RN: [birth year] (86 y.o. [name] Primary Care Mylei Brackeen: Leonides Cave Other Clinician: Referring Ulanda Tackett: Treating Kourtney Montesinos/Extender: Valda Lamb Weeks in Treatment: 1 Visit Information History Since Last Visit Added or deleted any medications: No Patient Arrived: Alexandria [name] Any new allergies or adverse reactions: No Arrival Time: 14:29 Had a fall or experienced change in No Transfer Assistance: None activities of daily living that may affect Patient Identification Verified: Yes risk of falls: Secondary Verification Process Completed: Yes Signs or symptoms of abuse/neglect since last visito No Patient Requires Transmission-Based Precautions: No Hospitalized since last visit: No Patient Has Alerts: No Implantable device outside of the clinic excluding No cellular tissue based products placed in the center since last visit: Has Dressing in Place as Prescribed: Yes Has Compression in Place as Prescribed: Yes Pain Present Now: No Electronic Signature(s) Signed: 12/10/2021 4:56:32 PM By: Lorrin Jackson Entered By: Lorrin Jackson on 12/10/2021 14:31:54 -------------------------------------------------------------------------------- Compression Therapy Details Patient Name: Date of Service: [place] RIA, [name] 12/10/2021 2:30 PM Medical Record Number: [id number] Patient Account Number: [id number] Date of Birth/Sex: Treating RN: [birth year] (86 y.o. [name] Primary Care Ezelle Surprenant: Leonides Cave Other Clinician: Referring Aarya Quebedeaux: Treating Danaija Eskridge/Extender: Valda Lamb Weeks in Treatment: 1 Compression Therapy Performed for Wound Assessment: Wound #1  Right,Posterior Lower Leg Performed By: Clinician Lorrin Jackson, RN Compression Type: Three Layer Post Procedure Diagnosis Same as Pre-procedure Electronic Signature(s) Signed: 12/10/2021 4:56:32 PM By: Lorrin Jackson Entered By: Lorrin Jackson on 12/10/2021 15:28:50 -------------------------------------------------------------------------------- Encounter Discharge Information Details Patient Name: Date of Service: [place] RIA, [name] 12/10/2021 2:30 PM Medical Record Number: [id number] Patient Account Number: [id number] Date of Birth/Sex: Treating RN: 06/18/1935 (86 y.o. [name] Primary Care Takeyla Million: Leonides Cave Other Clinician: Referring Acquanetta Cabanilla: Treating Monice Lundy/Extender: Valda Lamb Weeks in Treatment: 1 Encounter Discharge Information Items Post Procedure Vitals Discharge Condition: Stable Temperature (F): 98.4 Ambulatory Status: Walker Pulse (bpm): 80 Discharge Destination: Home Respiratory Rate (breaths/min): 18 Transportation: Private Auto Blood Pressure (mmHg): 159/74 Schedule Follow-up Appointment: Yes Clinical Summary of Care: Provided on 12/10/2021 Form Type Recipient Paper Patient Patient Electronic Signature(s) Signed: 12/10/2021 4:56:32 PM By: Lorrin Jackson Entered By: Lorrin Jackson on 12/10/2021 15:42:27 -------------------------------------------------------------------------------- Lower Extremity Assessment Details Patient Name: Date of Service: [place] RIA, [name] 12/10/2021 2:30 PM Medical Record Number: [id number] Patient Account Number: [id number] Date of Birth/Sex: Treating RN: 1935-12-04 (86 y.o. [name] Primary Care Alexandria [name]: Leonides Cave Other Clinician: Referring [name]: Treating [name]/Extender: Valda Lamb Weeks in Treatment: 1 Edema Assessment Assessed: [Left: No] Alexandria [name]: Yes] Edema: [Left: Ye] [Right: s] Calf Left: Right: Point of  Measurement: 29 cm From Medial Instep 38 cm Ankle Left: Right: Point of Measurement: 9 cm From Medial Instep 24.8 cm Vascular Assessment Pulses: Dorsalis Pedis Palpable: [Right:Yes] Electronic Signature(s) Signed: 12/10/2021 4:56:32 PM By: Lorrin Jackson Entered By: Lorrin Jackson on 12/10/2021 14:37:58 -------------------------------------------------------------------------------- Multi Wound Chart Details Patient Name: Date of Service: [place] RIA, [name] 12/10/2021 2:30 PM Medical Record Number: [id number] Patient Account Number: [id number] Date of Birth/Sex: Treating RN: Dec 27, 1935 (86 y.o. F) Primary Care Alexandria [name]: Leonides Cave Other Clinician: Referring Malin Sambrano: Treating Alexandria [name]/Extender: Valda Lamb Weeks in Treatment:  1 Vital Signs Height(in): 65 Pulse(bpm): 80 Weight(lbs): 219.5 Blood Pressure(mmHg): 159/74 Body Mass Index(BMI): 36.5 Temperature(F): 98.4 Respiratory Rate(breaths/min): 18 Photos: [N/A:N/A] Right, Posterior Lower Leg N/A N/A Wound Location: Trauma N/A N/A Wounding Event: Venous Leg Ulcer N/A N/A Primary Etiology: Cataracts, Chronic sinus N/A N/A Comorbid History: problems/congestion, Anemia, Chronic Obstructive Pulmonary Disease (COPD), Congestive Heart Failure, Coronary Artery Disease, Rheumatoid Arthritis, Neuropathy, Received Radiation 11/22/2021 N/A N/A Date Acquired: 1 N/A N/A Weeks of Treatment: Open N/A N/A Wound Status: No N/A N/A Wound Recurrence: 0.5x0.3x0.3 N/A N/A Measurements L x W x D (cm) 0.118 N/A N/A A (cm) : rea 0.035 N/A N/A Volume (cm) : 73.20% N/A N/A % Reduction in A rea: 80.10% N/A N/A % Reduction in Volume: Full Thickness Without Exposed N/A N/A Classification: Support Structures Medium N/A N/A Exudate A mount: Serosanguineous N/A N/A Exudate Type: red, brown N/A N/A Exudate Color: Distinct, outline attached N/A N/A Wound Margin: Large (67-100%) N/A  N/A Granulation A mount: Red, Pink N/A N/A Granulation Quality: Small (1-33%) N/A N/A Necrotic A mount: Fat Layer (Subcutaneous Tissue): Yes N/A N/A Exposed Structures: Fascia: No Tendon: No Muscle: No Joint: No Bone: No Medium (34-66%) N/A N/A Epithelialization: Debridement - Excisional N/A N/A Debridement: Pre-procedure Verification/Time Out 15:24 N/A N/A Taken: Lidocaine 4% Topical Solution N/A N/A Pain Control: Subcutaneous, Slough N/A N/A Tissue Debrided: Skin/Subcutaneous Tissue N/A N/A Level: 0.15 N/A N/A Debridement A (sq cm): rea Curette N/A N/A Instrument: Minimum N/A N/A Bleeding: Pressure N/A N/A Hemostasis A chieved: Procedure was tolerated well N/A N/A Debridement Treatment Response: 0.5x0.3x0.3 N/A N/A Post Debridement Measurements L x W x D (cm) 0.035 N/A N/A Post Debridement Volume: (cm) Excoriation: No N/A N/A Periwound Skin Texture: Induration: No Callus: No Crepitus: No Rash: No Scarring: No Maceration: No N/A N/A Periwound Skin Moisture: Dry/Scaly: No Atrophie Blanche: No N/A N/A Periwound Skin Color: Cyanosis: No Ecchymosis: No Erythema: No Hemosiderin Staining: No Mottled: No Pallor: No Rubor: No No Abnormality N/A N/A Temperature: Compression Therapy N/A N/A Procedures Performed: Debridement Treatment Notes Electronic Signature(s) Signed: 12/10/2021 4:20:55 PM By: Kalman Shan DO Entered By: Kalman Shan on 12/10/2021 15:38:41 -------------------------------------------------------------------------------- Multi-Disciplinary Care Plan Details Patient Name: Date of Service: [place] RIA, [name] 12/10/2021 2:30 PM Medical Record Number: [id number] Patient Account Number: [id number] Date of Birth/Sex: Treating RN: 1935-06-12 (86 y.o. [name] Primary Care Annaleese Guier: Leonides Cave Other Clinician: Referring Janah Mcculloh: Treating Lakashia Collison/Extender: Valda Lamb Weeks in  Treatment: 1 Active Inactive Venous Leg Ulcer Nursing Diagnoses: Actual venous Insuffiency (use after diagnosis is confirmed) Goals: Patient will maintain optimal edema control Date Initiated: 12/03/2021 Target Resolution Date: 12/31/2021 Goal Status: Active Interventions: Assess peripheral edema status every visit. Compression as ordered Provide education on venous insufficiency Treatment Activities: Therapeutic compression applied : 12/03/2021 Notes: Wound/Skin Impairment Nursing Diagnoses: Impaired tissue integrity Goals: Patient/caregiver will verbalize understanding of skin care regimen Date Initiated: 12/03/2021 Target Resolution Date: 12/31/2021 Goal Status: Active Ulcer/skin breakdown will have a volume reduction of 30% by week 4 Date Initiated: 12/03/2021 Target Resolution Date: 12/31/2021 Goal Status: Active Interventions: Assess patient/caregiver ability to obtain necessary supplies Assess patient/caregiver ability to perform ulcer/skin care regimen upon admission and as needed Assess ulceration(s) every visit Provide education on ulcer and skin care Treatment Activities: Topical wound management initiated : 12/03/2021 Notes: Electronic Signature(s) Signed: 12/10/2021 4:56:32 PM By: Lorrin Jackson Entered By: Lorrin Jackson on 12/10/2021 14:29:25 -------------------------------------------------------------------------------- Pain Assessment Details Patient Name: Date of Service: [place] RIA, [place]. 12/10/2021 2:30 PM Medical Record Number: [id number] Patient Account Number: [id number] Date of Birth/Sex: Treating RN: 12/04/1935 (86 y.o. [name] Primary Care Betheny Suchecki: Leonides Cave Other Clinician: Referring Aniqa Hare: Treating Kentrel Clevenger/Extender: Valda Lamb Weeks in Treatment: 1 Active Problems Location of Pain Severity and Description of Pain Patient Has Paino No Site Locations Pain Management and  Medication Current Pain Management: Electronic Signature(s) Signed: 12/10/2021 4:56:32 PM By: Lorrin Jackson Entered By: Lorrin Jackson on 12/10/2021 14:33:31 -------------------------------------------------------------------------------- Patient/Caregiver Education Details Patient Name: Date of Service: [name], [name] 10/5/2023andnbsp2:30 PM Medical Record Number: [id number] Patient Account Number: [id number] Date of Birth/Gender: Treating RN: 05-Jun-1935 (86 y.o. [name] Primary Care Physician: Leonides Cave Other Clinician: Referring Physician: Treating Physician/Extender: Rudolpho Sevin in Treatment: 1 Education Assessment Education Provided To: Patient Education Topics Provided Venous: Methods: Explain/Verbal, Printed Responses: State content correctly Wound/Skin Impairment: Methods: Explain/Verbal, Printed Responses: State content correctly Electronic Signature(s) Signed: 12/10/2021 4:56:32 PM By: Lorrin Jackson Entered By: Lorrin Jackson on 12/10/2021 14:29:43 -------------------------------------------------------------------------------- Wound Assessment Details Patient Name: Date of Service: [place] RIA, [name] 12/10/2021 2:30 PM Medical Record Number: [id number] Patient Account Number: [id number] Date of Birth/Sex: Treating RN: 1936-01-21 (86 y.o. [name] Primary Care Tyanna Hach: Leonides Cave Other Clinician: Referring Namrata Dangler: Treating Gabriel Conry/Extender: Valda Lamb Weeks in Treatment: 1 Wound Status Wound Number: 1 Primary Venous Leg Ulcer Etiology: Wound Location: Right, Posterior Lower Leg Wound Open Wounding Event: Trauma Status: Date Acquired: 11/22/2021 Comorbid Cataracts, Chronic sinus problems/congestion, Anemia, Chronic Weeks Of Treatment: 1 History: Obstructive Pulmonary Disease (COPD), Congestive Heart Failure, Clustered Wound: No Coronary Artery  Disease, Rheumatoid Arthritis, Neuropathy, Received Radiation Photos Wound Measurements Length: (cm) 0.5 Width: (cm) 0.3 Depth: (cm) 0.3 Area: (cm) 0.118 Volume: (cm) 0.035 % Reduction in Area: 73.2% % Reduction in Volume: 80.1% Epithelialization: Medium (34-66%) Tunneling: No Undermining: No Wound Description Classification: Full Thickness Without Exposed Support Structures Wound Margin: Distinct, outline attached Exudate Amount: Medium Exudate Type: Serosanguineous Exudate Color: red, brown Foul Odor After Cleansing: No Slough/Fibrino Yes Wound Bed Granulation Amount: Large (67-100%) Exposed Structure Granulation Quality: Red, Pink Fascia Exposed: No Necrotic Amount: Small (1-33%) Fat Layer (Subcutaneous Tissue) Exposed: Yes Necrotic Quality: Adherent Slough Tendon Exposed: No Muscle Exposed: No Joint Exposed: No Bone Exposed: No Periwound Skin Texture Texture Color No Abnormalities Noted: Yes No Abnormalities Noted: No Atrophie Blanche: No Moisture Cyanosis: No No Abnormalities Noted: Yes Ecchymosis: No Erythema: No Hemosiderin Staining: No Mottled: No Pallor: No Rubor: No Temperature / Pain Temperature: No Abnormality Treatment Notes Wound #1 (Lower Leg) Wound Laterality: Right, Posterior Cleanser Soap and Water Discharge Instruction: May shower and wash wound with dial antibacterial soap and water prior to dressing change. Wound Cleanser Discharge Instruction: Cleanse the wound with wound cleanser prior to applying a clean dressing using gauze sponges, not tissue or cotton balls. Peri-Wound Care Triamcinolone 15 (g) Discharge Instruction: Use triamcinolone 15 (g) as directed Sween Lotion (Moisturizing lotion) Discharge Instruction: Apply moisturizing lotion as directed Topical Primary Dressing Endoform 2x2 in Discharge Instruction: Moisten with saline Secondary Dressing ABD Pad, 8x10 Discharge Instruction: Apply over primary dressing as  directed. Secured With Compression Wrap ThreePress (3 layer compression wrap) Discharge Instruction: Apply three layer compression as directed. Compression Stockings Add-Ons Electronic Signature(s) Signed: 12/10/2021 4:56:32 PM By: Lorrin Jackson Entered By: Lorrin Jackson on 12/10/2021 14:43:34 -------------------------------------------------------------------------------- Vitals Details Patient Name: Date of Service: [place] RIA, [place]  [place]. 12/10/2021 2:30 PM Medical Record Number: [id number] Patient Account Number: [id number] Date of Birth/Sex: Treating RN: 1935-08-01 (86 y.o. [name] Primary Care Breeann Reposa: Leonides Cave Other Clinician: Referring Bristol Osentoski: Treating Randie Bloodgood/Extender: Valda Lamb Weeks in Treatment: 1 Vital Signs Time Taken: 14:31 Temperature (F): 98.4 Height (in): 65 Pulse (bpm): 80 Weight (lbs): 219.5 Respiratory Rate (breaths/min): 18 Body Mass Index (BMI): 36.5 Blood Pressure (mmHg): 159/74 Reference Range: 80 - 120 mg / dl Electronic Signature(s) Signed: 12/10/2021 4:56:32 PM By: Lorrin Jackson Entered By: Lorrin Jackson on 12/10/2021 14:33:25

## 2021-12-10 NOTE — Progress Notes (Signed)
Alexandria, [name] ([id number]) Visit Report for 12/10/2021 Chief Complaint Document Details Patient Name: Date of Service: Alexandria [name] 12/10/2021 2:30 PM Medical Record Number: [id number] Patient Account Number: [id number] Date of Birth/Sex: Treating RN: [birth year] (86 y.o. F) Primary Care Provider: Leonides [name] Other Clinician: Referring Provider: Treating Provider/Extender: Alexandria [name] Weeks in Treatment: 1 Information Obtained from: Patient Chief Complaint 12/03/2021; patient is here for review of a traumatic wound on the right posterior calf Electronic Signature(s) Signed: 12/10/2021 4:20:55 PM By: Alexandria Shan DO Entered By: Alexandria [name] on 12/10/2021 15:38:48 -------------------------------------------------------------------------------- Debridement Details Patient Name: Date of Service: Alexandria [name], Alexandria [name] E. 12/10/2021 2:30 PM Medical Record Number: [id number] Patient Account Number: [id number] Date of Birth/Sex: Treating RN: [birth year] (86 y.o. Alexandria [name] Primary Care Provider: Leonides [name] Other Clinician: Referring Provider: Treating Provider/Extender: Alexandria [name] Weeks in Treatment: 1 Debridement Performed for Assessment: Wound #1 Right,Posterior Lower Leg Performed By: Physician Alexandria Shan, DO Debridement Type: Debridement Severity of Tissue Pre Debridement: Fat layer exposed Level of Consciousness (Pre-procedure): Awake and Alert Pre-procedure Verification/Time Out Yes - 15:24 Taken: Start Time: 15:25 Pain Control: Lidocaine 4% T opical Solution T Area Debrided (L x W): otal 0.5 (cm) x 0.3 (cm) = 0.15 (cm) Tissue and other material debrided: Non-Viable, Slough, Subcutaneous, Slough Level: Skin/Subcutaneous Tissue Debridement Description: Excisional Instrument: Curette Bleeding: Minimum Hemostasis Achieved: Pressure End Time: 15:30 Response to Treatment: Procedure was  tolerated well Level of Consciousness (Post- Awake and Alert procedure): Post Debridement Measurements of Total Wound Length: (cm) 0.5 Width: (cm) 0.3 Depth: (cm) 0.3 Volume: (cm) 0.035 Character of Wound/Ulcer Post Debridement: Stable Severity of Tissue Post Debridement: Fat layer exposed Post Procedure Diagnosis Same as Pre-procedure Electronic Signature(s) Signed: 12/10/2021 4:20:55 PM By: Alexandria Shan DO Signed: 12/10/2021 4:56:32 PM By: Alexandria [name] Entered By: Alexandria [name] on 12/10/2021 15:28:36 -------------------------------------------------------------------------------- HPI Details Patient Name: Date of Service: Alexandria [name], Alexandria [name] E. 12/10/2021 2:30 PM Medical Record Number: [id number] Patient Account Number: [id number] Date of Birth/Sex: Treating RN: 1935-10-08 (86 y.o. F) Primary Care Provider: Leonides [name] Other Clinician: Referring Provider: Treating Provider/Extender: Alexandria [name] Weeks in Treatment: 1 History of Present Illness HPI Description: ADMISSION 12/03/2021 This is a 86 year old woman who arrives in clinic for review of a traumatic area on her right posterior calf that happened about 2 weeks ago. She says she did this on the foot rest of the wheelchair. She was seen in the ED on 11/23/2021 and was given a prescription for doxycycline. She normally wears compression stockings but stopped wearing them "because of the wound". She has had increasing edema in the right leg since then to the point where the wound is weeping edema. She has been using Xeroform that she had leftover. From previous wounds on her legs. I note that she was previously seen in [place] wound care center in 2022 at that point felt to have chronic venous insufficiency and lymphedema Past medical history is really quite extensive she has type 2 diabetes with neuropathy but very well controlled with a hemoglobin A1c of 5.8, COPD on chronic oxygen,  hyperlipidemia, hypothyroidism, history of right upper lobe lung Alexandria, iron deficiency anemia, gastroesophageal reflux disease/esophagitis, she has been seen in our sister clinic in [place] in the past ABI in our clinic was 1.08. She had an echocardiogram done this year that was reasonably unremarkable. She also had a DVT rule out study that was  negative in April of this year 10/5; patient presents for follow-up. She tolerated the compression wrap well. She has been using Iodosorb under 3 layer compression. Electronic Signature(s) Signed: 12/10/2021 4:20:55 PM By: Alexandria Shan DO Entered By: Alexandria [name] on 12/10/2021 15:39:46 -------------------------------------------------------------------------------- Physical Exam Details Patient Name: Date of Service: Alexandria [name], Alexandria [name] E. 12/10/2021 2:30 PM Medical Record Number: [id number] Patient Account Number: [id number] Date of Birth/Sex: Treating RN: [birth year] (86 y.o. F) Primary Care Provider: Leonides [name] Other Clinician: Referring Provider: Treating Provider/Extender: Alexandria [name] Weeks in Treatment: 1 Constitutional respirations regular, non-labored and within target range for patient.. Cardiovascular 2+ dorsalis pedis/posterior tibialis pulses. Psychiatric pleasant and cooperative. Notes Right lower extremity: T the medial posterior calf there is a small open wound with slough and granulation tissue. No signs of surrounding infection. Decent o edema control. Electronic Signature(s) Signed: 12/10/2021 4:20:55 PM By: Alexandria Shan DO Entered By: Alexandria [name] on 12/10/2021 15:40:42 -------------------------------------------------------------------------------- Physician Orders Details Patient Name: Date of Service: Alexandria [name], Alexandria [name] E. 12/10/2021 2:30 PM Medical Record Number: [id number] Patient Account Number: [id number] Date of Birth/Sex: Treating RN: 1935/05/30 (86 y.o. Alexandria [name] Primary Care Provider: Leonides [name] Other Clinician: Referring Provider: Treating Provider/Extender: Alexandria [name] Weeks in Treatment: 1 Verbal / Phone Orders: No Diagnosis Coding Follow-up Appointments ppointment in 1 week. - with Dr. Heber Garden [name] and Alexandria Anna, RN (Room 7) Return A Anesthetic (In clinic) Topical Lidocaine 5% applied to wound bed (In clinic) Topical Lidocaine 4% applied to wound bed Bathing/ Shower/ Hygiene May shower with protection but do not get wound dressing(s) wet. - Can shower with cast protector bag from [facility] or [employer] Edema Control - Lymphedema / SCD / Other Elevate legs to the level of the heart or above for 30 minutes daily and/or when sitting, a frequency of: - throughout the day Avoid standing for long periods of time. Additional Orders / Instructions Follow Nutritious Diet Wound Treatment Wound #1 - Lower Leg Wound Laterality: Right, Posterior Cleanser: Soap and Water 1 x Per Week Discharge Instructions: May shower and wash wound with dial antibacterial soap and water prior to dressing change. Cleanser: Wound Cleanser 1 x Per Week Discharge Instructions: Cleanse the wound with wound cleanser prior to applying a clean dressing using gauze sponges, not tissue or cotton balls. Peri-Wound Care: Triamcinolone 15 (g) 1 x Per Week Discharge Instructions: Use triamcinolone 15 (g) as directed Peri-Wound Care: Sween Lotion (Moisturizing lotion) 1 x Per Week Discharge Instructions: Apply moisturizing lotion as directed Prim Dressing: Endoform 2x2 in 1 x Per Week ary Discharge Instructions: Moisten with saline Secondary Dressing: ABD Pad, 8x10 1 x Per Week Discharge Instructions: Apply over primary dressing as directed. Compression Wrap: ThreePress (3 layer compression wrap) 1 x Per Week Discharge Instructions: Apply three layer compression as directed. Electronic Signature(s) Signed: 12/10/2021 4:20:55 PM By: Alexandria Shan DO Entered By: Alexandria [name] on 12/10/2021 15:40:49 -------------------------------------------------------------------------------- Problem List Details Patient Name: Date of Service: Alexandria [name], Alexandria [name] E. 12/10/2021 2:30 PM Medical Record Number: [id number] Patient Account Number: [id number] Date of Birth/Sex: Treating RN: 07-Dec-1935 (86 y.o. F) Primary Care Provider: Leonides [name] Other Clinician: Referring Provider: Treating Provider/Extender: Alexandria [name] Weeks in Treatment: 1 Active Problems ICD-10 Encounter Code Description Active Date MDM Diagnosis S81.811D Laceration without foreign body, right lower leg, subsequent encounter 12/03/2021 No Yes L97.818 Non-pressure chronic ulcer of other part of right lower leg with other specified  12/03/2021 No Yes severity I87.321 Chronic venous hypertension (idiopathic) with inflammation of right lower 12/03/2021 No Yes extremity Inactive Problems Resolved Problems Electronic Signature(s) Signed: 12/10/2021 4:20:55 PM By: Alexandria Shan DO Entered By: Alexandria [name] on 12/10/2021 15:38:37 -------------------------------------------------------------------------------- Progress Note Details Patient Name: Date of Service: Alexandria [name], Alexandria [name] E. 12/10/2021 2:30 PM Medical Record Number: [id number] Patient Account Number: [id number] Date of Birth/Sex: Treating RN: May 11, 1935 (86 y.o. F) Primary Care Provider: Leonides [name] Other Clinician: Referring Provider: Treating Provider/Extender: Alexandria [name] Weeks in Treatment: 1 Subjective Chief Complaint Information obtained from Patient 12/03/2021; patient is here for review of a traumatic wound on the right posterior calf History of Present Illness (HPI) ADMISSION 12/03/2021 This is a 86 year old woman who arrives in clinic for review of a traumatic area on her right posterior calf that happened about 2 weeks ago. She says  she did this on the foot rest of the wheelchair. She was seen in the ED on 11/23/2021 and was given a prescription for doxycycline. She normally wears compression stockings but stopped wearing them "because of the wound". She has had increasing edema in the right leg since then to the point where the wound is weeping edema. She has been using Xeroform that she had leftover. From previous wounds on her legs. I note that she was previously seen in [place] wound care center in 2022 at that point felt to have chronic venous insufficiency and lymphedema Past medical history is really quite extensive she has type 2 diabetes with neuropathy but very well controlled with a hemoglobin A1c of 5.8, COPD on chronic oxygen, hyperlipidemia, hypothyroidism, history of right upper lobe lung Alexandria, iron deficiency anemia, gastroesophageal reflux disease/esophagitis, she has been seen in our sister clinic in [place] in the past ABI in our clinic was 1.08. She had an echocardiogram done this year that was reasonably unremarkable. She also had a DVT rule out study that was negative in April of this year 10/5; patient presents for follow-up. She tolerated the compression wrap well. She has been using Iodosorb under 3 layer compression. Patient History Information obtained from Patient. Family History Cancer - Mother, Heart Disease - Mother, Lung Disease - Father, Thyroid Problems - Siblings, No family history of Diabetes, Hereditary Spherocytosis, Hypertension, Kidney Disease, Seizures, Stroke, Tuberculosis. Social History Former smoker - 100 years - ended on 03/08/2005, Marital Status - Widowed, Alcohol Use - Never, Drug Use - No History, Caffeine Use - Daily. Medical History Eyes Patient has history of Cataracts Denies history of Glaucoma, Optic Neuritis Ear/Nose/Mouth/Throat Patient has history of Chronic sinus problems/congestion Hematologic/Lymphatic Patient has history of Anemia Denies history of Hemophilia,  Human Immunodeficiency Virus, Lymphedema, Sickle Cell Disease Respiratory Patient has history of Chronic Obstructive Pulmonary Disease (COPD) Denies history of Aspiration, Asthma, Pneumothorax, Sleep Apnea, Tuberculosis Cardiovascular Patient has history of Congestive Heart Failure, Coronary Artery Disease Denies history of Angina, Arrhythmia, Deep Vein Thrombosis, Hypertension, Hypotension, Myocardial Infarction, Peripheral Arterial Disease, Peripheral Venous Disease, Phlebitis, Vasculitis Gastrointestinal Denies history of Cirrhosis , Colitis, Crohnoos, Hepatitis A, Hepatitis B, Hepatitis C Endocrine Denies history of Type II Diabetes Genitourinary Denies history of End Stage Renal Disease Immunological Denies history of Lupus Erythematosus, Raynaudoos, Scleroderma Integumentary (Skin) Denies history of History of Burn Musculoskeletal Patient has history of Rheumatoid Arthritis Denies history of Gout, Osteoarthritis, Osteomyelitis Neurologic Patient has history of Neuropathy Denies history of Dementia, Quadriplegia, Paraplegia, Seizure Disorder Oncologic Patient has history of Received Radiation Denies history of Received Chemotherapy Psychiatric Denies  history of Anorexia/bulimia, Confinement Anxiety Hospitalization/Surgery History - hystertomy. - both shoulders replaced. - C-section x 2. Medical A Surgical History Notes nd Cardiovascular Heart mummer Endocrine Prediabetic Psychiatric anxiety Objective Constitutional respirations regular, non-labored and within target range for patient.. Vitals Time Taken: 2:31 PM, Height: 65 in, Weight: 219.5 lbs, BMI: 36.5, Temperature: 98.4 F, Pulse: 80 bpm, Respiratory Rate: 18 breaths/min, Blood Pressure: 159/74 mmHg. Cardiovascular 2+ dorsalis pedis/posterior tibialis pulses. Psychiatric pleasant and cooperative. General Notes: Right lower extremity: T the medial posterior calf there is a small open wound with slough and  granulation tissue. No signs of surrounding o infection. Decent edema control. Integumentary (Hair, Skin) Wound #1 status is Open. Original cause of wound was Trauma. The date acquired was: 11/22/2021. The wound has been in treatment 1 weeks. The wound is located on the Right,Posterior Lower Leg. The wound measures 0.5cm length x 0.3cm width x 0.3cm depth; 0.118cm^2 area and 0.035cm^3 volume. There is Fat Layer (Subcutaneous Tissue) exposed. There is no tunneling or undermining noted. There is a medium amount of serosanguineous drainage noted. The wound margin is distinct with the outline attached to the wound base. There is large (67-100%) red, pink granulation within the wound bed. There is a small (1- 33%) amount of necrotic tissue within the wound bed including Adherent Slough. The periwound skin appearance had no abnormalities noted for texture. The periwound skin appearance had no abnormalities noted for moisture. The periwound skin appearance did not exhibit: Atrophie Blanche, Cyanosis, Ecchymosis, Hemosiderin Staining, Mottled, Pallor, Rubor, Erythema. Periwound temperature was noted as No Abnormality. Assessment Active Problems ICD-10 Laceration without foreign body, right lower leg, subsequent encounter Non-pressure chronic ulcer of other part of right lower leg with other specified severity Chronic venous hypertension (idiopathic) with inflammation of right lower extremity Patient's wound has shown improvement in size and appearance since last clinic visit. I debrided nonviable tissue. I recommended switching the dressing to help with further granulation tissue growth to endoform. Continue 3 layer compression. Follow-up in 1 week Procedures Wound #1 Pre-procedure diagnosis of Wound #1 is a Venous Leg Ulcer located on the Right,Posterior Lower Leg .Severity of Tissue Pre Debridement is: Fat layer exposed. There was a Excisional Skin/Subcutaneous Tissue Debridement with a total area of  0.15 sq cm performed by Alexandria Shan, DO. With the following instrument(s): Curette to remove Non-Viable tissue/material. Material removed includes Subcutaneous Tissue and Slough and after achieving pain control using Lidocaine 4% T opical Solution. No specimens were taken. A time out was conducted at 15:24, prior to the start of the procedure. A Minimum amount of bleeding was controlled with Pressure. The procedure was tolerated well. Post Debridement Measurements: 0.5cm length x 0.3cm width x 0.3cm depth; 0.035cm^3 volume. Character of Wound/Ulcer Post Debridement is stable. Severity of Tissue Post Debridement is: Fat layer exposed. Post procedure Diagnosis Wound #1: Same as Pre-Procedure Pre-procedure diagnosis of Wound #1 is a Venous Leg Ulcer located on the Right,Posterior Lower Leg . There was a Three Layer Compression Therapy Procedure by Alexandria Jackson, RN. Post procedure Diagnosis Wound #1: Same as Pre-Procedure Plan Follow-up Appointments: Return Appointment in 1 week. - with Dr. Heber Sussex and Alexandria Anna, RN (Room 7) Anesthetic: (In clinic) Topical Lidocaine 5% applied to wound bed (In clinic) Topical Lidocaine 4% applied to wound bed Bathing/ Shower/ Hygiene: May shower with protection but do not get wound dressing(s) wet. - Can shower with cast protector bag from [facility], [facility] or [employer] Edema Control - Lymphedema / SCD / Other: Elevate legs to  the level of the heart or above for 30 minutes daily and/or when sitting, a frequency of: - throughout the day Avoid standing for long periods of time. Additional Orders / Instructions: Follow Nutritious Diet WOUND #1: - Lower Leg Wound Laterality: Right, Posterior Cleanser: Soap and Water 1 x Per Week/ Discharge Instructions: May shower and wash wound with dial antibacterial soap and water prior to dressing change. Cleanser: Wound Cleanser 1 x Per Week/ Discharge Instructions: Cleanse the wound with wound cleanser prior to applying a clean  dressing using gauze sponges, not tissue or cotton balls. Peri-Wound Care: Triamcinolone 15 (g) 1 x Per Week/ Discharge Instructions: Use triamcinolone 15 (g) as directed Peri-Wound Care: Sween Lotion (Moisturizing lotion) 1 x Per Week/ Discharge Instructions: Apply moisturizing lotion as directed Prim Dressing: Endoform 2x2 in 1 x Per Week/ ary Discharge Instructions: Moisten with saline Secondary Dressing: ABD Pad, 8x10 1 x Per Week/ Discharge Instructions: Apply over primary dressing as directed. Com pression Wrap: ThreePress (3 layer compression wrap) 1 x Per Week/ Discharge Instructions: Apply three layer compression as directed. 1. In office sharp debridement 2. Endoform under 3 layer compression 3. Follow-up in 1 week Electronic Signature(s) Signed: 12/10/2021 4:20:55 PM By: Alexandria Shan DO Entered By: Alexandria [name] on 12/10/2021 15:41:44 -------------------------------------------------------------------------------- HxROS Details Patient Name: Date of Service: Alexandria [name], Alexandria [name] E. 12/10/2021 2:30 PM Medical Record Number: [id number] Patient Account Number: [id number] Date of Birth/Sex: Treating RN: Feb 16, 1936 (86 y.o. F) Primary Care Provider: Leonides [name] Other Clinician: Referring Provider: Treating Provider/Extender: Alexandria [name] Weeks in Treatment: 1 Information Obtained From Patient Eyes Medical History: Positive for: Cataracts Negative for: Glaucoma; Optic Neuritis Ear/Nose/Mouth/Throat Medical History: Positive for: Chronic sinus problems/congestion Hematologic/Lymphatic Medical History: Positive for: Anemia Negative for: Hemophilia; Human Immunodeficiency Virus; Lymphedema; Sickle Cell Disease Respiratory Medical History: Positive for: Chronic Obstructive Pulmonary Disease (COPD) Negative for: Aspiration; Asthma; Pneumothorax; Sleep Apnea; Tuberculosis Cardiovascular Medical History: Positive for: Congestive Heart  Failure; Coronary Artery Disease Negative for: Angina; Arrhythmia; Deep Vein Thrombosis; Hypertension; Hypotension; Myocardial Infarction; Peripheral Arterial Disease; Peripheral Venous Disease; Phlebitis; Vasculitis Past Medical History Notes: Heart mummer Gastrointestinal Medical History: Negative for: Cirrhosis ; Colitis; Crohns; Hepatitis A; Hepatitis B; Hepatitis C Endocrine Medical History: Negative for: Type II Diabetes Past Medical History Notes: Prediabetic Genitourinary Medical History: Negative for: End Stage Renal Disease Immunological Medical History: Negative for: Lupus Erythematosus; Raynauds; Scleroderma Integumentary (Skin) Medical History: Negative for: History of Burn Musculoskeletal Medical History: Positive for: Rheumatoid Arthritis Negative for: Gout; Osteoarthritis; Osteomyelitis Neurologic Medical History: Positive for: Neuropathy Negative for: Dementia; Quadriplegia; Paraplegia; Seizure Disorder Oncologic Medical History: Positive for: Received Radiation Negative for: Received Chemotherapy Psychiatric Medical History: Negative for: Anorexia/bulimia; Confinement Anxiety Past Medical History Notes: anxiety HBO Extended History Items Ear/Nose/Mouth/Throat: Eyes: Chronic sinus Cataracts problems/congestion Immunizations Pneumococcal Vaccine: Received Pneumococcal Vaccination: Yes Received Pneumococcal Vaccination On or After 60th Birthday: Yes Implantable Devices None Hospitalization / Surgery History Type of Hospitalization/Surgery hystertomy both shoulders replaced C-section x 2 Family and Social History Cancer: Yes - Mother; Diabetes: No; Heart Disease: Yes - Mother; Hereditary Spherocytosis: No; Hypertension: No; Kidney Disease: No; Lung Disease: Yes - Father; Seizures: No; Stroke: No; Thyroid Problems: Yes - Siblings; Tuberculosis: No; Former smoker - 27 years - ended on 03/08/2005; Marital Status - Widowed; Alcohol Use: Never; Drug  Use: No History; Caffeine Use: Daily; Financial Concerns: No; Food, Clothing or Shelter Needs: No; Support System Lacking: No; Transportation Concerns: No Electronic Signature(s) Signed: 12/10/2021 4:20:55 PM By:  Alexandria Shan DO Entered By: Alexandria [name] on 12/10/2021 15:39:52 -------------------------------------------------------------------------------- SuperBill Details Patient Name: Date of Service: Alexandria [name], Alexandria [name] E. 12/10/2021 Medical Record Number: [id number] Patient Account Number: [id number] Date of Birth/Sex: Treating RN: [birth year] (86 y.o. Alexandria [name] Primary Care Provider: Leonides [name] Other Clinician: Referring Provider: Treating Provider/Extender: Alexandria [name] Weeks in Treatment: 1 Diagnosis Coding ICD-10 Codes Code Description [phone] Laceration without foreign body, right lower leg, subsequent encounter L97.818 Non-pressure chronic ulcer of other part of right lower leg with other specified severity I87.321 Chronic venous hypertension (idiopathic) with inflammation of right lower extremity Facility Procedures CPT4 Code: [id number] Description: 86578 - DEB SUBQ TISSUE 20 SQ CM/< ICD-10 Diagnosis Description L97.818 Non-pressure chronic ulcer of other part of right lower leg with other specified Modifier: severity Quantity: 1 Physician Procedures : CPT4 Code Description Modifier [id number] - WC PHYS SUBQ TISS 20 SQ CM ICD-10 Diagnosis Description L97.818 Non-pressure chronic ulcer of other part of right lower leg with other specified severity Quantity: 1 Electronic Signature(s) Signed: 12/10/2021 4:20:55 PM By: Alexandria Shan DO Entered By: Alexandria [name] on 12/10/2021 15:42:06

## 2021-12-15 ENCOUNTER — Other Ambulatory Visit: Payer: Self-pay | Admitting: *Deleted

## 2021-12-15 DIAGNOSIS — D649 Anemia, unspecified: Secondary | ICD-10-CM

## 2021-12-15 MED FILL — Iron Sucrose Inj 20 MG/ML (Fe Equiv): INTRAVENOUS | Qty: 10 | Status: AC

## 2021-12-16 ENCOUNTER — Inpatient Hospital Stay: Payer: Medicare Other

## 2021-12-16 DIAGNOSIS — D509 Iron deficiency anemia, unspecified: Secondary | ICD-10-CM | POA: Diagnosis not present

## 2021-12-16 DIAGNOSIS — Z79899 Other long term (current) drug therapy: Secondary | ICD-10-CM | POA: Diagnosis not present

## 2021-12-16 DIAGNOSIS — Z9981 Dependence on supplemental oxygen: Secondary | ICD-10-CM | POA: Diagnosis not present

## 2021-12-16 DIAGNOSIS — Z85118 Personal history of other malignant neoplasm of bronchus and lung: Secondary | ICD-10-CM | POA: Diagnosis not present

## 2021-12-16 DIAGNOSIS — J449 Chronic obstructive pulmonary disease, unspecified: Secondary | ICD-10-CM | POA: Diagnosis not present

## 2021-12-16 DIAGNOSIS — Z87891 Personal history of nicotine dependence: Secondary | ICD-10-CM | POA: Diagnosis not present

## 2021-12-16 DIAGNOSIS — D649 Anemia, unspecified: Secondary | ICD-10-CM

## 2021-12-16 LAB — CBC WITH DIFFERENTIAL/PLATELET
Abs Immature Granulocytes: 0.05 10*3/uL (ref 0.00–0.07)
Basophils Absolute: 0 10*3/uL (ref 0.0–0.1)
Basophils Relative: 0 %
Eosinophils Absolute: 0.1 10*3/uL (ref 0.0–0.5)
Eosinophils Relative: 2 %
HCT: 30.7 % — ABNORMAL LOW (ref 36.0–46.0)
Hemoglobin: 9.3 g/dL — ABNORMAL LOW (ref 12.0–15.0)
Immature Granulocytes: 1 %
Lymphocytes Relative: 22 %
Lymphs Abs: 1.6 10*3/uL (ref 0.7–4.0)
MCH: 29.4 pg (ref 26.0–34.0)
MCHC: 30.3 g/dL (ref 30.0–36.0)
MCV: 97.2 fL (ref 80.0–100.0)
Monocytes Absolute: 0.5 10*3/uL (ref 0.1–1.0)
Monocytes Relative: 7 %
Neutro Abs: 5 10*3/uL (ref 1.7–7.7)
Neutrophils Relative %: 68 %
Platelets: 189 10*3/uL (ref 150–400)
RBC: 3.16 MIL/uL — ABNORMAL LOW (ref 3.87–5.11)
RDW: 15.9 % — ABNORMAL HIGH (ref 11.5–15.5)
WBC: 7.2 10*3/uL (ref 4.0–10.5)
nRBC: 0 % (ref 0.0–0.2)

## 2021-12-16 LAB — SAMPLE TO BLOOD BANK

## 2021-12-17 ENCOUNTER — Ambulatory Visit (HOSPITAL_BASED_OUTPATIENT_CLINIC_OR_DEPARTMENT_OTHER): Payer: Medicare Other | Admitting: Internal Medicine

## 2021-12-18 ENCOUNTER — Encounter: Payer: Self-pay | Admitting: Internal Medicine

## 2021-12-21 ENCOUNTER — Encounter (HOSPITAL_BASED_OUTPATIENT_CLINIC_OR_DEPARTMENT_OTHER): Payer: Medicare Other | Admitting: Internal Medicine

## 2021-12-21 DIAGNOSIS — S81811A Laceration without foreign body, right lower leg, initial encounter: Secondary | ICD-10-CM | POA: Diagnosis not present

## 2021-12-21 DIAGNOSIS — Z9981 Dependence on supplemental oxygen: Secondary | ICD-10-CM | POA: Diagnosis not present

## 2021-12-21 DIAGNOSIS — I87321 Chronic venous hypertension (idiopathic) with inflammation of right lower extremity: Secondary | ICD-10-CM | POA: Diagnosis not present

## 2021-12-21 DIAGNOSIS — L97818 Non-pressure chronic ulcer of other part of right lower leg with other specified severity: Secondary | ICD-10-CM | POA: Diagnosis not present

## 2021-12-21 DIAGNOSIS — E1140 Type 2 diabetes mellitus with diabetic neuropathy, unspecified: Secondary | ICD-10-CM | POA: Diagnosis not present

## 2021-12-21 DIAGNOSIS — J449 Chronic obstructive pulmonary disease, unspecified: Secondary | ICD-10-CM | POA: Diagnosis not present

## 2021-12-21 NOTE — Progress Notes (Signed)
Alexandria [name], Alexandria [name] ([id number]) [id number] Page 1 of 9 Visit Report for 12/21/2021 Chief Complaint Document Details Patient Name: Date of Service: Alexandria [name], [place] Alexandria LYN E. 12/21/2021 2:45 PM Medical Record Number: [id number] Patient Account Number: [id number] Date of Birth/Sex: Treating RN: [birth year] (86 y.o. F) Primary Care Provider: Leonides Cave Other Clinician: Referring Provider: Treating Provider/Extender: Valda Lamb Weeks in Treatment: 2 Information Obtained from: Patient Chief Complaint 12/03/2021; patient is here for review of a traumatic wound on the right posterior calf Electronic Signature(s) Signed: 12/21/2021 3:56:07 PM By: Kalman Shan DO Entered By: Kalman Shan on 12/21/2021 15:41:13 -------------------------------------------------------------------------------- Debridement Details Patient Name: Date of Service: Alexandria [name], Alexandria Alexandria LYN E. 12/21/2021 2:45 PM Medical Record Number: [id number] Patient Account Number: [id number] Date of Birth/Sex: Treating RN: May 31, 1935 (86 y.o. [name] Primary Care Provider: Leonides Cave Other Clinician: Referring Provider: Treating Provider/Extender: Valda Lamb Weeks in Treatment: 2 Debridement Performed for Assessment: Wound #1 Right,Posterior Lower Leg Performed By: Physician Kalman Shan, DO Debridement Type: Debridement Severity of Tissue Pre Debridement: Fat layer exposed Level of Consciousness (Pre-procedure): Awake and Alert Pre-procedure Verification/Time Out Yes - 15:32 Taken: Start Time: 15:33 Pain Control: Lidocaine 4% T opical Solution T Area Debrided (L x W): otal 0.4 (cm) x 0.3 (cm) = 0.12 (cm) Tissue and other material debrided: Non-Viable, Slough, Slough Level: Non-Viable Tissue Debridement Description: Selective/Open Wound Instrument: Curette Bleeding: Minimum Hemostasis Achieved: Pressure End Time:  15:37 Response to Treatment: Procedure was tolerated well Level of Consciousness (Post- Awake and Alert procedure): Post Debridement Measurements of Total Wound Length: (cm) 0.4 Width: (cm) 0.3 Depth: (cm) 0.3 Volume: (cm) 0.028 Character of Wound/Ulcer Post Debridement: Stable Severity of Tissue Post Debridement: Fat layer exposed Post Procedure Diagnosis Alexandria [name], Alexandria [name] ([id number]) [id number] Page 2 of 9 Same as Pre-procedure Notes Procedure scribed for Dr. Heber [place] by Janan Halter, RN Electronic Signature(s) Signed: 12/21/2021 3:56:07 PM By: Kalman Shan DO Signed: 12/21/2021 1:50:52 PM By: Lorrin Jackson Entered By: Lorrin Jackson on 12/21/2021 15:41:25 -------------------------------------------------------------------------------- HPI Details Patient Name: Date of Service: Alexandria [name], Alexandria Alexandria LYN E. 12/21/2021 2:45 PM Medical Record Number: [id number] Patient Account Number: [id number] Date of Birth/Sex: Treating RN: 1936-03-03 (86 y.o. F) Primary Care Provider: Leonides Cave Other Clinician: Referring Provider: Treating Provider/Extender: Valda Lamb Weeks in Treatment: 2 History of Present Illness HPI Description: ADMISSION 12/03/2021 This is a 86 year old woman who arrives in clinic for review of a traumatic area on her right posterior calf that happened about 2 weeks ago. She says she did this on the foot rest of the wheelchair. She was seen in the ED on 11/23/2021 and was given a prescription for doxycycline. She normally wears compression stockings but stopped wearing them "because of the wound". She has had increasing edema in the right leg since then to the point where the wound is weeping edema. She has been using Xeroform that she had leftover. From previous wounds on her legs. I note that she was previously seen in [place] wound care center in 2022 at that point felt to have chronic venous insufficiency and  lymphedema Past medical history is really quite extensive she has type 2 diabetes with neuropathy but very well controlled with a hemoglobin A1c of 5.8, COPD on chronic oxygen, hyperlipidemia, hypothyroidism, history of right upper lobe lung Alexandria, iron deficiency anemia, gastroesophageal reflux disease/esophagitis, she has been seen in our sister clinic in [place] in the past ABI in our  clinic was 1.08. She had an echocardiogram done this year that was reasonably unremarkable. She also had a DVT rule out study that was negative in April of this year 10/5; patient presents for follow-up. She tolerated the compression wrap well. She has been using Iodosorb under 3 layer compression. 10/16; patient presents for follow-up. We have been using endoform under 3 layer compression. She has no issues or complaints today. Electronic Signature(s) Signed: 12/21/2021 3:56:07 PM By: Kalman Shan DO Entered By: Kalman Shan on 12/21/2021 15:41:57 -------------------------------------------------------------------------------- Dressings and/or debridement of burns; large Details Patient Name: Date of Service: Alexandria [name], [place] Alexandria LYN E. 12/21/2021 2:45 PM Medical Record Number: [id number] Patient Account Number: [id number] Date of Birth/Sex: Treating RN: 1936-03-05 (86 y.o. [name] Primary Care Provider: Leonides Cave Other Clinician: Referring Provider: Treating Provider/Extender: Valda Lamb Weeks in Treatment: 2 Procedure Performed for: Wound #1 Right,Posterior Lower Leg Performed By: Physician Kalman Shan, DO Post Procedure Diagnosis Same as Pre-procedure Alexandria [name], Alexandria [name] ([id number]) [id number] Page 3 of 9 Electronic Signature(s) Signed: 12/21/2021 3:56:07 PM By: Kalman Shan DO Signed: 12/21/2021 1:50:52 PM By: Alexandria [name] By: Lorrin Jackson on 12/21/2021  15:41:37 -------------------------------------------------------------------------------- Physical Exam Details Patient Name: Date of Service: Alexandria [name], Alexandria Alexandria LYN E. 12/21/2021 2:45 PM Medical Record Number: [id number] Patient Account Number: [id number] Date of Birth/Sex: Treating RN: [birth year] (86 y.o. F) Primary Care Provider: Leonides Cave Other Clinician: Referring Provider: Treating Provider/Extender: Valda Lamb Weeks in Treatment: 2 Constitutional respirations regular, non-labored and within target range for patient.. Cardiovascular 2+ dorsalis pedis/posterior tibialis pulses. Psychiatric pleasant and cooperative. Notes Right lower extremity: T the medial posterior calf there is a small open wound with slough and granulation tissue. No signs of surrounding infection. Decent o edema control. Electronic Signature(s) Signed: 12/21/2021 3:56:07 PM By: Kalman Shan DO Entered By: Kalman Shan on 12/21/2021 15:42:21 -------------------------------------------------------------------------------- Physician Orders Details Patient Name: Date of Service: Alexandria [name], Alexandria Alexandria LYN E. 12/21/2021 2:45 PM Medical Record Number: [id number] Patient Account Number: [id number] Date of Birth/Sex: Treating RN: [birth year] (86 y.o. F) Primary Care Provider: Leonides Cave Other Clinician: Referring Provider: Treating Provider/Extender: Valda Lamb Weeks in Treatment: 2 Verbal / Phone Orders: No Diagnosis Coding ICD-10 Coding Code Description [phone] Laceration without foreign body, right lower leg, subsequent encounter L97.818 Non-pressure chronic ulcer of other part of right lower leg with other specified severity I87.321 Chronic venous hypertension (idiopathic) with inflammation of right lower extremity Wound Treatment Electronic Signature(s) Signed: 12/21/2021 3:56:07 PM By: Kalman Shan DO Entered By: Kalman Shan  on 12/21/2021 15:42:31 Alexandria [name] ([id number]) [id number] Page 4 of 9 -------------------------------------------------------------------------------- Problem List Details Patient Name: Date of Service: Alexandria [name], [place] Alexandria LYN E. 12/21/2021 2:45 PM Medical Record Number: [id number] Patient Account Number: [id number] Date of Birth/Sex: Treating RN: 20-Dec-1935 (86 y.o. F) Primary Care Provider: Leonides Cave Other Clinician: Referring Provider: Treating Provider/Extender: Valda Lamb Weeks in Treatment: 2 Active Problems ICD-10 Encounter Code Description Active Date MDM Diagnosis S81.811D Laceration without foreign body, right lower leg, subsequent encounter 12/03/2021 No Yes L97.818 Non-pressure chronic ulcer of other part of right lower leg with other specified 12/03/2021 No Yes severity I87.321 Chronic venous hypertension (idiopathic) with inflammation of right lower 12/03/2021 No Yes extremity Inactive Problems Resolved Problems Electronic Signature(s) Signed: 12/21/2021 3:56:07 PM By: Kalman Shan DO Entered By: Kalman Shan on 12/21/2021 15:41:05 -------------------------------------------------------------------------------- Progress Note Details Patient Name: Date of Service: Alexandria  [name], Alexandria Alexandria LYN E. 12/21/2021 2:45 PM Medical Record Number: [id number] Patient Account Number: [id number] Date of Birth/Sex: Treating RN: [birth year] (86 y.o. F) Primary Care Provider: Leonides Cave Other Clinician: Referring Provider: Treating Provider/Extender: Valda Lamb Weeks in Treatment: 2 Subjective Chief Complaint Information obtained from Patient 12/03/2021; patient is here for review of a traumatic wound on the right posterior calf History of Present Illness (HPI) ADMISSION 12/03/2021 This is a 86 year old woman who arrives in clinic for review of a traumatic area on her right posterior calf  that happened about 2 weeks ago. She says she did this on the foot rest of the wheelchair. She was seen in the ED on 11/23/2021 and was given a prescription for doxycycline. She normally wears compression stockings but stopped wearing them "because of the wound". She has had increasing edema in the right leg since then to the point where the wound is weeping Alexandria [name], Alexandria [name] ([id number]) [id number] Page 5 of 9 edema. She has been using Xeroform that she had leftover. From previous wounds on her legs. I note that she was previously seen in [place] wound care center in 2022 at that point felt to have chronic venous insufficiency and lymphedema Past medical history is really quite extensive she has type 2 diabetes with neuropathy but very well controlled with a hemoglobin A1c of 5.8, COPD on chronic oxygen, hyperlipidemia, hypothyroidism, history of right upper lobe lung Alexandria, iron deficiency anemia, gastroesophageal reflux disease/esophagitis, she has been seen in our sister clinic in [place] in the past ABI in our clinic was 1.08. She had an echocardiogram done this year that was reasonably unremarkable. She also had a DVT rule out study that was negative in April of this year 10/5; patient presents for follow-up. She tolerated the compression wrap well. She has been using Iodosorb under 3 layer compression. 10/16; patient presents for follow-up. We have been using endoform under 3 layer compression. She has no issues or complaints today. Patient History Information obtained from Patient. Family History Cancer - Mother, Heart Disease - Mother, Lung Disease - Father, Thyroid Problems - Siblings, No family history of Diabetes, Hereditary Spherocytosis, Hypertension, Kidney Disease, Seizures, Stroke, Tuberculosis. Social History Former smoker - 30 years - ended on 03/08/2005, Marital Status - Widowed, Alcohol Use - Never, Drug Use - No History, Caffeine Use - Daily. Medical  History Eyes Patient has history of Cataracts Denies history of Glaucoma, Optic Neuritis Ear/Nose/Mouth/Throat Patient has history of Chronic sinus problems/congestion Hematologic/Lymphatic Patient has history of Anemia Denies history of Hemophilia, Human Immunodeficiency Virus, Lymphedema, Sickle Cell Disease Respiratory Patient has history of Chronic Obstructive Pulmonary Disease (COPD) Denies history of Aspiration, Asthma, Pneumothorax, Sleep Apnea, Tuberculosis Cardiovascular Patient has history of Congestive Heart Failure, Coronary Artery Disease Denies history of Angina, Arrhythmia, Deep Vein Thrombosis, Hypertension, Hypotension, Myocardial Infarction, Peripheral Arterial Disease, Peripheral Venous Disease, Phlebitis, Vasculitis Gastrointestinal Denies history of Cirrhosis , Colitis, Crohnoos, Hepatitis A, Hepatitis B, Hepatitis C Endocrine Denies history of Type II Diabetes Genitourinary Denies history of End Stage Renal Disease Immunological Denies history of Lupus Erythematosus, Raynaudoos, Scleroderma Integumentary (Skin) Denies history of History of Burn Musculoskeletal Patient has history of Rheumatoid Arthritis Denies history of Gout, Osteoarthritis, Osteomyelitis Neurologic Patient has history of Neuropathy Denies history of Dementia, Quadriplegia, Paraplegia, Seizure Disorder Oncologic Patient has history of Received Radiation Denies history of Received Chemotherapy Psychiatric Denies history of Anorexia/bulimia, Confinement Anxiety Hospitalization/Surgery History - hystertomy. - both shoulders replaced. - C-section x 2. Medical A  Surgical History Notes nd Cardiovascular Heart mummer Endocrine Prediabetic Psychiatric anxiety Objective Constitutional respirations regular, non-labored and within target range for patient.. Vitals Time Taken: 3:10 PM, Height: 65 in, Weight: 219.5 lbs, BMI: 36.5, Temperature: 98.5 F, Pulse: 89 bpm, Respiratory Rate: 20  breaths/min, Blood Pressure: 152/77 mmHg. Alexandria [name], Alexandria [name] ([id number]) [id number] Page 6 of 9 Cardiovascular 2+ dorsalis pedis/posterior tibialis pulses. Psychiatric pleasant and cooperative. General Notes: Right lower extremity: T the medial posterior calf there is a small open wound with slough and granulation tissue. No signs of surrounding o infection. Decent edema control. Integumentary (Hair, Skin) Wound #1 status is Open. Original cause of wound was Trauma. The date acquired was: 11/22/2021. The wound has been in treatment 2 weeks. The wound is located on the Right,Posterior Lower Leg. The wound measures 0.4cm length x 0.3cm width x 0.3cm depth; 0.094cm^2 area and 0.028cm^3 volume. There is Fat Layer (Subcutaneous Tissue) exposed. There is no tunneling or undermining noted. There is a medium amount of serosanguineous drainage noted. The wound margin is distinct with the outline attached to the wound base. There is large (67-100%) red, pink granulation within the wound bed. There is a small (1- 33%) amount of necrotic tissue within the wound bed including Adherent Slough. The periwound skin appearance had no abnormalities noted for texture. The periwound skin appearance had no abnormalities noted for moisture. The periwound skin appearance did not exhibit: Atrophie Blanche, Cyanosis, Ecchymosis, Hemosiderin Staining, Mottled, Pallor, Rubor, Erythema. Periwound temperature was noted as No Abnormality. Assessment Active Problems ICD-10 Laceration without foreign body, right lower leg, subsequent encounter Non-pressure chronic ulcer of other part of right lower leg with other specified severity Chronic venous hypertension (idiopathic) with inflammation of right lower extremity Patient's wound is stable. I debrided nonviable tissue. At this time I recommended switching the dressing to silver alginate since there has some increased depth to it and using  gentamicin ointment to address any bioburden. We will Alexandria up on the compression therapy to 4 layer compression. Follow-up in 1 week. Procedures Wound #1 Pre-procedure diagnosis of Wound #1 is a Venous Leg Ulcer located on the Right,Posterior Lower Leg .Severity of Tissue Pre Debridement is: Fat layer exposed. There was a Selective/Open Wound Non-Viable Tissue Debridement with a total area of 0.12 sq cm performed by Kalman Shan, DO. With the following instrument(s): Curette to remove Non-Viable tissue/material. Material removed includes [facility] after achieving pain control using Lidocaine 4% Topical Solution. No specimens were taken. A time out was conducted at 15:32, prior to the start of the procedure. A Minimum amount of bleeding was controlled with Pressure. The procedure was tolerated well. Post Debridement Measurements: 0.4cm length x 0.3cm width x 0.3cm depth; 0.028cm^3 volume. Character of Wound/Ulcer Post Debridement is stable. Severity of Tissue Post Debridement is: Fat layer exposed. Post procedure Diagnosis Wound #1: Same as Pre-Procedure General Notes: Procedure scribed for Dr. Heber [place] by Janan Halter, RN. Pre-procedure diagnosis of Wound #1 is a Venous Leg Ulcer located on the Right,Posterior Lower Leg . An Dressings and/or debridement of burns; large procedure was performed by Kalman Shan, DO. Post procedure Diagnosis Wound #1: Same as Pre-Procedure Plan 1. In office sharp debridement 2. Silver alginate with gentamicin under 4-layer compression 3. Follow-up in 1 week Electronic Signature(s) Signed: 12/21/2021 3:56:07 PM By: Kalman Shan DO Entered By: Kalman Shan on 12/21/2021 15:43:22 HxROS Details -------------------------------------------------------------------------------- Alexandria [name] ([id number]) [id number] Page 7 of 9 Patient Name: Date of Service: Alexandria [name], [place] Alexandria LYN E. 12/21/2021 2:45 PM  Medical Record Number:  [id number] Patient Account Number: [id number] Date of Birth/Sex: Treating RN: Oct 07, 1935 (86 y.o. F) Primary Care Provider: Leonides Cave Other Clinician: Referring Provider: Treating Provider/Extender: Valda Lamb Weeks in Treatment: 2 Information Obtained From Patient Eyes Medical History: Positive for: Cataracts Negative for: Glaucoma; Optic Neuritis Ear/Nose/Mouth/Throat Medical History: Positive for: Chronic sinus problems/congestion Hematologic/Lymphatic Medical History: Positive for: Anemia Negative for: Hemophilia; Human Immunodeficiency Virus; Lymphedema; Sickle Cell Disease Respiratory Medical History: Positive for: Chronic Obstructive Pulmonary Disease (COPD) Negative for: Aspiration; Asthma; Pneumothorax; Sleep Apnea; Tuberculosis Cardiovascular Medical History: Positive for: Congestive Heart Failure; Coronary Artery Disease Negative for: Angina; Arrhythmia; Deep Vein Thrombosis; Hypertension; Hypotension; Myocardial Infarction; Peripheral Arterial Disease; Peripheral Venous Disease; Phlebitis; Vasculitis Past Medical History Notes: Heart mummer Gastrointestinal Medical History: Negative for: Cirrhosis ; Colitis; Crohns; Hepatitis A; Hepatitis B; Hepatitis C Endocrine Medical History: Negative for: Type II Diabetes Past Medical History Notes: Prediabetic Genitourinary Medical History: Negative for: End Stage Renal Disease Immunological Medical History: Negative for: Lupus Erythematosus; Raynauds; Scleroderma Integumentary (Skin) Medical History: Negative for: History of Burn Musculoskeletal Medical History: Positive for: Rheumatoid Arthritis Negative for: Gout; Osteoarthritis; Osteomyelitis Neurologic Medical History: Positive for: Neuropathy Negative for: Dementia; Quadriplegia; Paraplegia; Seizure Disorder Alexandria [name], Alexandria [name] ([id number]) [id number] Page 8 of 9 Oncologic Medical  History: Positive for: Received Radiation Negative for: Received Chemotherapy Psychiatric Medical History: Negative for: Anorexia/bulimia; Confinement Anxiety Past Medical History Notes: anxiety HBO Extended History Items Ear/Nose/Mouth/Throat: Eyes: Chronic sinus Cataracts problems/congestion Immunizations Pneumococcal Vaccine: Received Pneumococcal Vaccination: Yes Received Pneumococcal Vaccination On or After 60th Birthday: Yes Implantable Devices None Hospitalization / Surgery History Type of Hospitalization/Surgery hystertomy both shoulders replaced C-section x 2 Family and Social History Cancer: Yes - Mother; Diabetes: No; Heart Disease: Yes - Mother; Hereditary Spherocytosis: No; Hypertension: No; Kidney Disease: No; Lung Disease: Yes - Father; Seizures: No; Stroke: No; Thyroid Problems: Yes - Siblings; Tuberculosis: No; Former smoker - 63 years - ended on 03/08/2005; Marital Status - Widowed; Alcohol Use: Never; Drug Use: No History; Caffeine Use: Daily; Financial Concerns: No; Food, Clothing or Shelter Needs: No; Support System Lacking: No; Transportation Concerns: No Electronic Signature(s) Signed: 12/21/2021 3:56:07 PM By: Kalman Shan DO Entered By: Kalman Shan on 12/21/2021 15:42:02 -------------------------------------------------------------------------------- SuperBill Details Patient Name: Date of Service: Alexandria [name], Alexandria Alexandria LYN E. 12/21/2021 Medical Record Number: [id number] Patient Account Number: [id number] Date of Birth/Sex: Treating RN: January 22, 1936 (86 y.o. [name] Primary Care Provider: Leonides Cave Other Clinician: Referring Provider: Treating Provider/Extender: Valda Lamb Weeks in Treatment: 2 Diagnosis Coding ICD-10 Codes Code Description [phone] Laceration without foreign body, right lower leg, subsequent encounter L97.818 Non-pressure chronic ulcer of other part of right lower leg with other  specified severity I87.321 Chronic venous hypertension (idiopathic) with inflammation of right lower extremity Facility Procedures : Alexandria [name], Alexandria [name] Code: [id number] Alexandria [name] ([id number] IC Description: [phone] - DEBRIDE WOUND 1ST 20 SQ CM OR < ICD-10 Diagnosis Description [phone]) [phone] D-10 Diagnosis Description L97.818 Non-pressure chronic ulcer of other part of right lower leg with other specified severi Modifier: [id number] Quantity: 1 pdf Page 9 of 9 Physician Procedures : CPT4 Code Description Modifier [id number] - WC PHYS DEBR WO ANESTH 20 SQ CM ICD-10 Diagnosis Description L97.818 Non-pressure chronic ulcer of other part of right lower leg with other specified severity Quantity: 1 Electronic Signature(s) Signed: 12/21/2021 3:56:07 PM By: Kalman Shan DO Entered By: Kalman Shan on 12/21/2021 15:43:29

## 2021-12-21 NOTE — Progress Notes (Signed)
RILYNNE, [name] ([id number]) [id number] Page 1 of 6 Visit Report for 12/21/2021 Arrival Information Details Patient Name: Date of Service: Alexandria [name], [place] [name]. 12/21/2021 2:45 PM Medical Record Number: [id number] Patient Account Number: [id number] Date of Birth/Sex: Treating RN: 10/07/1935 (86 y.o. [name] Primary Care Markail Diekman: Leonides Cave Other Clinician: Referring Zenith Kercheval: Treating Aimar Borghi/Extender: Valda Lamb Weeks in Treatment: 2 Visit Information History Since Last Visit Added or deleted any medications: No Patient Arrived: [name] Any new allergies or adverse reactions: No Arrival Time: 15:07 Had a fall or experienced change in No Transfer Assistance: None activities of daily living that may affect Patient Identification Verified: Yes risk of falls: Secondary Verification Process Completed: Yes Signs or symptoms of abuse/neglect since last visito No Patient Requires Transmission-Based Precautions: No Hospitalized since last visit: No Patient Has Alerts: No Implantable device outside of the clinic excluding No cellular tissue based products placed in the center since last visit: Has Dressing in Place as Prescribed: Yes Has Compression in Place as Prescribed: Yes Pain Present Now: No Electronic Signature(s) Signed: 12/21/2021 1:50:52 PM By: Lorrin Jackson Entered By: Lorrin Jackson on 12/21/2021 15:10:01 -------------------------------------------------------------------------------- Lower Extremity Assessment Details Patient Name: Date of Service: [name], [place] [name]. 12/21/2021 2:45 PM Medical Record Number: [id number] Patient Account Number: [id number] Date of Birth/Sex: Treating RN: 1935/04/07 (86 y.o. [name] Primary Care Hai Grabe: Leonides Cave Other Clinician: Referring Emric Kowalewski: Treating Maryrose Colvin/Extender: Valda Lamb Weeks in Treatment: 2 Edema  Assessment Assessed: [Left: No] [Right: Yes] Edema: [Left: Ye] [Right: s] Calf Left: Right: Point of Measurement: 29 cm From Medial Instep 36 cm Ankle Left: Right: Point of Measurement: 9 cm From Medial Instep 22.4 cm Vascular Assessment Pulses: Dorsalis Pedis Palpable: [Right:Yes [id number] Page 2 of 6] Electronic Signature(s) Signed: 12/21/2021 1:50:52 PM By: Lorrin Jackson Entered By: Lorrin Jackson on 12/21/2021 15:18:17 -------------------------------------------------------------------------------- Multi Wound Chart Details Patient Name: Date of Service: [name], [place] [name]. 12/21/2021 2:45 PM Medical Record Number: [id number] Patient Account Number: [id number] Date of Birth/Sex: Treating RN: September 11, 1935 (86 y.o. F) Primary Care Apollos Tenbrink: Leonides Cave Other Clinician: Referring Xion Debruyne: Treating Leya Paige/Extender: Valda Lamb Weeks in Treatment: 2 Vital Signs Height(in): 58 Pulse(bpm): 77 Weight(lbs): 219.5 Blood Pressure(mmHg): 152/77 Body Mass Index(BMI): 36.5 Temperature(F): 98.5 Respiratory Rate(breaths/min): 20 [1:Photos:] [N/A:N/A] Right, Posterior Lower Leg N/A N/A Wound Location: Trauma N/A N/A Wounding Event: Venous Leg Ulcer N/A N/A Primary Etiology: Cataracts, Chronic sinus N/A N/A Comorbid History: problems/congestion, Anemia, Chronic Obstructive Pulmonary Disease (COPD), Congestive Heart Failure, Coronary Artery Disease, Rheumatoid Arthritis, Neuropathy, Received Radiation 11/22/2021 N/A N/A Date Acquired: 2 N/A N/A Weeks of Treatment: Open N/A N/A Wound Status: No N/A N/A Wound Recurrence: 0.4x0.3x0.3 N/A N/A Measurements L x W x D (cm) 0.094 N/A N/A A (cm) : rea 0.028 N/A N/A Volume (cm) : 78.60% N/A N/A % Reduction in Area: 84.10% N/A N/A % Reduction in Volume: Full Thickness Without Exposed N/A N/A Classification: Support Structures Medium N/A N/A Exudate  Amount: Serosanguineous N/A N/A Exudate Type: red, brown N/A N/A Exudate Color: Distinct, outline attached N/A N/A Wound Margin: Large (67-100%) N/A N/A Granulation Amount: Red, Pink N/A N/A Granulation Quality: Small (1-33%) N/A N/A Necrotic Amount: Fat Layer (Subcutaneous Tissue): Yes N/A N/A Exposed Structures: Fascia: No Tendon: No Muscle: No Joint: No Bone: No Medium (34-66%) N/A N/A Epithelialization: Excoriation: No N/A N/A Periwound Skin Texture: Induration: No Callus: No Crepitus: No Alexandria [name] (  [id number]) [id number] Page 3 of 6 Rash: No Scarring: No Maceration: No N/A N/A Periwound Skin Moisture: Dry/Scaly: No Atrophie Blanche: No N/A N/A Periwound Skin Color: Cyanosis: No Ecchymosis: No Erythema: No Hemosiderin Staining: No Mottled: No Pallor: No Rubor: No No Abnormality N/A N/A Temperature: Treatment Notes Electronic Signature(s) Signed: 12/21/2021 3:56:07 PM By: Kalman Shan DO Entered By: Kalman Shan on 12/21/2021 15:41:08 -------------------------------------------------------------------------------- Multi-Disciplinary Care Plan Details Patient Name: Date of Service: [name], [place] [name]. 12/21/2021 2:45 PM Medical Record Number: [id number] Patient Account Number: [id number] Date of Birth/Sex: Treating RN: 01-08-1936 (86 y.o. [name] Primary Care Kenyatte Gruber: Leonides Cave Other Clinician: Referring Khalifa Knecht: Treating Ericka Marcellus/Extender: Valda Lamb Weeks in Treatment: 2 Active Inactive Venous Leg Ulcer Nursing Diagnoses: Actual venous Insuffiency (use after diagnosis is confirmed) Goals: Patient will maintain optimal edema control Date Initiated: 12/03/2021 Target Resolution Date: 12/31/2021 Goal Status: Active Interventions: Assess peripheral edema status every visit. Compression as ordered Provide education on venous insufficiency Treatment  Activities: Therapeutic compression applied : 12/03/2021 Notes: Wound/Skin Impairment Nursing Diagnoses: Impaired tissue integrity Goals: Patient/caregiver will verbalize understanding of skin care regimen Date Initiated: 12/03/2021 Target Resolution Date: 12/31/2021 Goal Status: Active Ulcer/skin breakdown will have a volume reduction of 30% by week 4 Date Initiated: 12/03/2021 Target Resolution Date: 12/31/2021 Goal Status: Active Interventions: Assess patient/caregiver ability to obtain necessary supplies Assess patient/caregiver ability to perform ulcer/skin care regimen upon admission and as needed SHAWNETTE, AUGELLO ([id number]) [id number] Page 4 of 6 Assess ulceration(s) every visit Provide education on ulcer and skin care Treatment Activities: Topical wound management initiated : 12/03/2021 Notes: Electronic Signature(s) Signed: 12/21/2021 1:50:52 PM By: Lorrin Jackson Entered By: Lorrin Jackson on 12/21/2021 15:38:18 -------------------------------------------------------------------------------- Pain Assessment Details Patient Name: Date of Service: [name], [name]. 12/21/2021 2:45 PM Medical Record Number: [id number] Patient Account Number: [id number] Date of Birth/Sex: Treating RN: 17-May-1935 (86 y.o. [name] Primary Care Gonzalo Waymire: Leonides Cave Other Clinician: Referring Deano Tomaszewski: Treating [place] Mullett/Extender: Valda Lamb Weeks in Treatment: 2 Active Problems Location of Pain Severity and Description of Pain Patient Has Paino No Site Locations Pain Management and Medication Current Pain Management: Electronic Signature(s) Signed: 12/21/2021 1:50:52 PM By: Lorrin Jackson Entered By: Lorrin Jackson on 12/21/2021 15:12:28 -------------------------------------------------------------------------------- Patient/Caregiver Education Details Patient Name: Date of Service: [name], [place] [name].  10/16/2023andnbsp2:45 PM Medical Record Number: [id number] Patient Account Number: [id number] Date of Birth/Gender: Treating RN: 08/12/1935 (86 y.o. [name] Primary Care Physician: Leonides Cave Other Clinician: Referring Physician: Treating Physician/Extender: Takerra, Lupinacci ([id number]) [phone].pdf Page 5 of 6 Weeks in Treatment: 2 Education Assessment Education Provided To: Patient Education Topics Provided Venous: Methods: Explain/Verbal, Printed Responses: State content correctly Wound/Skin Impairment: Methods: Explain/Verbal, Printed Responses: State content correctly Electronic Signature(s) Signed: 12/21/2021 1:50:52 PM By: Lorrin Jackson Entered By: Lorrin Jackson on 12/21/2021 15:39:28 -------------------------------------------------------------------------------- Wound Assessment Details Patient Name: Date of Service: [name], [place] [name]. 12/21/2021 2:45 PM Medical Record Number: [id number] Patient Account Number: [id number] Date of Birth/Sex: Treating RN: 1935-12-19 (86 y.o. [name] Primary Care Loghan Subia: Leonides Cave Other Clinician: Referring Allysia Ingles: Treating Donyel Castagnola/Extender: Valda Lamb Weeks in Treatment: 2 Wound Status Wound Number: 1 Primary Venous Leg Ulcer Etiology: Wound Location: Right, Posterior Lower Leg Wound Open Wounding Event: Trauma Status: Date Acquired: 11/22/2021 Comorbid Cataracts, Chronic sinus problems/congestion, Anemia, Chronic Weeks Of Treatment: 2 History: Obstructive Pulmonary Disease (COPD),  Congestive Heart Failure, Clustered Wound: No Coronary Artery Disease, Rheumatoid Arthritis, Neuropathy, Received Radiation Photos Wound Measurements Length: (cm) 0.4 Width: (cm) 0.3 Depth: (cm) 0.3 Area: (cm) 0.094 Volume: (cm) 0.028 % Reduction in Area: 78.6% % Reduction in Volume: 84.1% Epithelialization:  Medium (34-66%) Tunneling: No Undermining: No Wound Description Classification: Full Thickness Without Exposed Support Structures Wound Margin: Distinct, outline attached GABRYELLA, MURFIN [name] ([id number]) Exudate Amount: Medium Exudate Type: Serosanguineous Exudate Color: red, brown Foul Odor After Cleansing: No Slough/Fibrino Yes [id number] Page 6 of 6 Wound Bed Granulation Amount: Large (67-100%) Exposed Structure Granulation Quality: Red, Pink Fascia Exposed: No Necrotic Amount: Small (1-33%) Fat Layer (Subcutaneous Tissue) Exposed: Yes Necrotic Quality: Adherent Slough Tendon Exposed: No Muscle Exposed: No Joint Exposed: No Bone Exposed: No Periwound Skin Texture Texture Color No Abnormalities Noted: Yes No Abnormalities Noted: No Atrophie Blanche: No Moisture Cyanosis: No No Abnormalities Noted: Yes Ecchymosis: No Erythema: No Hemosiderin Staining: No Mottled: No Pallor: No Rubor: No Temperature / Pain Temperature: No Abnormality Electronic Signature(s) Signed: 12/21/2021 1:50:52 PM By: Lorrin Jackson Entered By: Lorrin Jackson on 12/21/2021 15:22:17 -------------------------------------------------------------------------------- Vitals Details Patient Name: Date of Service: [name], [place] [name]. 12/21/2021 2:45 PM Medical Record Number: [id number] Patient Account Number: [id number] Date of Birth/Sex: Treating RN: 11/21/1935 (86 y.o. [name] Primary Care Dustyn Armbrister: Leonides Cave Other Clinician: Referring Latamara Melder: Treating Rohen Kimes/Extender: Valda Lamb Weeks in Treatment: 2 Vital Signs Time Taken: 15:10 Temperature (F): 98.5 Height (in): 65 Pulse (bpm): 89 Weight (lbs): 219.5 Respiratory Rate (breaths/min): 20 Body Mass Index (BMI): 36.5 Blood Pressure (mmHg): 152/77 Reference Range: 80 - 120 mg / dl Electronic Signature(s) Signed: 12/21/2021 1:50:52 PM By: Lorrin Jackson Entered By:  Lorrin Jackson on 12/21/2021 15:12:02

## 2021-12-25 ENCOUNTER — Encounter: Payer: Self-pay | Admitting: Internal Medicine

## 2021-12-28 ENCOUNTER — Telehealth: Payer: Self-pay | Admitting: Family Medicine

## 2021-12-28 ENCOUNTER — Encounter (HOSPITAL_BASED_OUTPATIENT_CLINIC_OR_DEPARTMENT_OTHER): Payer: Medicare Other | Admitting: Internal Medicine

## 2021-12-28 DIAGNOSIS — E1140 Type 2 diabetes mellitus with diabetic neuropathy, unspecified: Secondary | ICD-10-CM | POA: Diagnosis not present

## 2021-12-28 DIAGNOSIS — S81811A Laceration without foreign body, right lower leg, initial encounter: Secondary | ICD-10-CM | POA: Diagnosis not present

## 2021-12-28 DIAGNOSIS — S81811D Laceration without foreign body, right lower leg, subsequent encounter: Secondary | ICD-10-CM

## 2021-12-28 DIAGNOSIS — L97818 Non-pressure chronic ulcer of other part of right lower leg with other specified severity: Secondary | ICD-10-CM

## 2021-12-28 DIAGNOSIS — I87321 Chronic venous hypertension (idiopathic) with inflammation of right lower extremity: Secondary | ICD-10-CM

## 2021-12-28 DIAGNOSIS — J449 Chronic obstructive pulmonary disease, unspecified: Secondary | ICD-10-CM | POA: Diagnosis not present

## 2021-12-28 DIAGNOSIS — Z9981 Dependence on supplemental oxygen: Secondary | ICD-10-CM | POA: Diagnosis not present

## 2021-12-28 NOTE — Telephone Encounter (Signed)
Left message for patient to call back and schedule Medicare Annual Wellness Visit (AWV) either virtually or phone  . Left  my jabber number [phone]   Last AWV  02/05/20     45 min for awv-i and in office appointments 30 min for awv-s  phone/virtual appointments

## 2021-12-28 NOTE — Progress Notes (Signed)
AMINAT, SHELBURNE ([id number]) [id number] Page 1 of 8 Visit Report for 12/28/2021 Chief Complaint Document Details Patient Name: Date of Service: Alexandria [name], [place] Alexandria LYN E. 12/28/2021 3:30 PM Medical Record Number: [id number] Patient Account Number: [id number] Date of Birth/Sex: Treating RN: Jul 25, 1935 (86 y.o. F) Primary Care Provider: Leonides [name] Other Clinician: Referring Provider: Treating Provider/Extender: Alexandria [name] in Treatment: 3 Information Obtained from: Patient Chief Complaint 12/03/2021; patient is here for review of a traumatic wound on the right posterior calf Electronic Signature(s) Signed: 12/28/2021 4:00:55 PM By: Alexandria Shan DO Entered By: Alexandria [name] on 12/28/2021 15:57:27 -------------------------------------------------------------------------------- HPI Details Patient Name: Date of Service: Alexandria [name], Alexandria Alexandria LYN E. 12/28/2021 3:30 PM Medical Record Number: [id number] Patient Account Number: [id number] Date of Birth/Sex: Treating RN: Dec 20, 1935 (86 y.o. F) Primary Care Provider: Leonides [name] Other Clinician: Referring Provider: Treating Provider/Extender: Alexandria [name] in Treatment: 3 History of Present Illness HPI Description: ADMISSION 12/03/2021 This is a 86 year old woman who arrives in clinic for review of a traumatic area on her right posterior calf that happened about 2 [name] ago. She says she did this on the foot rest of the wheelchair. She was seen in the ED on 11/23/2021 and was given a prescription for doxycycline. She normally wears compression stockings but stopped wearing them "because of the wound". She has had increasing edema in the right leg since then to the point where the wound is weeping edema. She has been using Xeroform that she had leftover. From previous wounds on her legs. I note that she was previously seen in [facility] in 2022 at that point felt to have chronic venous insufficiency and lymphedema Past medical history is really quite extensive she has type 2 diabetes with neuropathy but very well controlled with a hemoglobin A1c of 5.8, COPD on chronic oxygen, hyperlipidemia, hypothyroidism, history of right upper lobe lung Alexandria, iron deficiency anemia, gastroesophageal reflux disease/esophagitis, she has been seen in our sister clinic in [place] in the past ABI in our clinic was 1.08. She had an echocardiogram done this year that was reasonably unremarkable. She also had a DVT rule out study that was negative in April of this year 10/5; patient presents for follow-up. She tolerated the compression wrap well. She has been using Iodosorb under 3 layer compression. 10/16; patient presents for follow-up. We have been using endoform under 3 layer compression. She has no issues or complaints today. 10/23; patient presents for follow-up. We have been using silver alginate with antibiotic ointment under 4-layer compression. Patient has no issues or complaints today. Electronic Signature(s) Signed: 12/28/2021 4:00:55 PM By: Alexandria Shan DO Entered By: Alexandria [name] on 12/28/2021 15:58:01 [name] ([id number]) [id number] Page 2 of 8 -------------------------------------------------------------------------------- Physical Exam Details Patient Name: Date of Service: Alexandria [name], [place] Alexandria LYN E. 12/28/2021 3:30 PM Medical Record Number: [id number] Patient Account Number: [id number] Date of Birth/Sex: Treating RN: January 26, 1936 (86 y.o. F) Primary Care Provider: Leonides [name] Other Clinician: Referring Provider: Treating Provider/Extender: Alexandria [name] in Treatment: 3 Constitutional respirations regular, non-labored and within target range for patient.. Cardiovascular 2+ dorsalis pedis/posterior tibialis pulses. Psychiatric pleasant  and cooperative. Notes Right lower extremity: T the medial posterior calf there is a small open wound with granulation tissue at the opening. Decent edema control. No signs of o surrounding infection. Electronic Signature(s) Signed: 12/28/2021 4:00:55 PM By: Alexandria Shan DO Entered By: Alexandria [name] on 12/28/2021  15:59:35 -------------------------------------------------------------------------------- Physician Orders Details Patient Name: Date of Service: Alexandria [name], [place] Alexandria LYN E. 12/28/2021 3:30 PM Medical Record Number: [id number] Patient Account Number: [id number] Date of Birth/Sex: Treating RN: Aug 06, 1935 (86 y.o. Tonita Phoenix, Alexandria [name] Primary Care Provider: Leonides [name] Other Clinician: Referring Provider: Treating Provider/Extender: Alexandria [name] in Treatment: 3 Verbal / Phone Orders: No Diagnosis Coding ICD-10 Coding Code Description [phone] Laceration without foreign body, right lower leg, subsequent encounter L97.818 Non-pressure chronic ulcer of other part of right lower leg with other specified severity I87.321 Chronic venous hypertension (idiopathic) with inflammation of right lower extremity Follow-up Appointments ppointment in 1 week. - with Dr. Heber [place] Return A Anesthetic (In clinic) Topical Lidocaine 5% applied to wound bed (In clinic) Topical Lidocaine 4% applied to wound bed Bathing/ Shower/ Hygiene May shower with protection but do not get wound dressing(s) wet. - Can shower with cast protector bag from [facility] or [employer] Edema Control - Lymphedema / SCD / Other Elevate legs to the level of the heart or above for 30 minutes daily and/or when sitting, a frequency of: - throughout the day Avoid standing for long periods of time. Additional Orders / Instructions Alexandria [name], Alexandria [name] ([id number]) [id number] Page 3 of 8 Follow Nutritious Diet Wound Treatment Wound #1 - Lower Leg Wound  Laterality: Right, Posterior Cleanser: Soap and Water 1 x Per Week Discharge Instructions: May shower and wash wound with dial antibacterial soap and water prior to dressing change. Cleanser: Wound Cleanser 1 x Per Week Discharge Instructions: Cleanse the wound with wound cleanser prior to applying a clean dressing using gauze sponges, not tissue or cotton balls. Peri-Wound Care: Triamcinolone 15 (g) 1 x Per Week Discharge Instructions: Use triamcinolone 15 (g) as directed Peri-Wound Care: Sween Lotion (Moisturizing lotion) 1 x Per Week Discharge Instructions: Apply moisturizing lotion as directed Topical: Gentamicin 1 x Per Week Discharge Instructions: As directed by physician Topical: Mupirocin Ointment 1 x Per Week Discharge Instructions: Apply Mupirocin (Bactroban) as instructed Prim Dressing: KerraCel Ag Gelling Fiber Dressing, 4x5 in (silver alginate) 1 x Per Week ary Discharge Instructions: Apply silver alginate to wound bed as instructed Secondary Dressing: ABD Pad, 8x10 1 x Per Week Discharge Instructions: Apply over primary dressing as directed. Compression Wrap: FourPress (4 layer compression wrap) 1 x Per Week Discharge Instructions: Apply four layer compression as directed. May also use Miliken CoFlex 2 layer compression system as alternative. Electronic Signature(s) Signed: 12/28/2021 4:00:55 PM By: Alexandria Shan DO Entered By: Alexandria [name] on 12/28/2021 15:59:42 -------------------------------------------------------------------------------- Problem List Details Patient Name: Date of Service: Alexandria [name], Alexandria Alexandria LYN E. 12/28/2021 3:30 PM Medical Record Number: [id number] Patient Account Number: [id number] Date of Birth/Sex: Treating RN: [birth year] (86 y.o. F) Primary Care Provider: Leonides [name] Other Clinician: Referring Provider: Treating Provider/Extender: Alexandria [name] in Treatment: 3 Active Problems ICD-10 Encounter Code  Description Active Date MDM Diagnosis S81.811D Laceration without foreign body, right lower leg, subsequent encounter 12/03/2021 No Yes L97.818 Non-pressure chronic ulcer of other part of right lower leg with other specified 12/03/2021 No Yes severity I87.321 Chronic venous hypertension (idiopathic) with inflammation of right lower 12/03/2021 No Yes extremity Inactive Problems Alexandria [name], Alexandria [name] ([id number]) [id number] Page 4 of 8 Resolved Problems Electronic Signature(s) Signed: 12/28/2021 4:00:55 PM By: Alexandria Shan DO Entered By: Alexandria [name] on 12/28/2021 15:56:52 -------------------------------------------------------------------------------- Progress Note Details Patient Name: Date of Service: Alexandria [name], Alexandria Alexandria LYN E. 12/28/2021 3:30 PM Medical Record Number: [id number] Patient Account  Number: [id number] Date of Birth/Sex: Treating RN: 10-Feb-1936 (86 y.o. F) Primary Care Provider: Leonides [name] Other Clinician: Referring Provider: Treating Provider/Extender: Alexandria [name] in Treatment: 3 Subjective Chief Complaint Information obtained from Patient 12/03/2021; patient is here for review of a traumatic wound on the right posterior calf History of Present Illness (HPI) ADMISSION 12/03/2021 This is a 86 year old woman who arrives in clinic for review of a traumatic area on her right posterior calf that happened about 2 [name] ago. She says she did this on the foot rest of the wheelchair. She was seen in the ED on 11/23/2021 and was given a prescription for doxycycline. She normally wears compression stockings but stopped wearing them "because of the wound". She has had increasing edema in the right leg since then to the point where the wound is weeping edema. She has been using Xeroform that she had leftover. From previous wounds on her legs. I note that she was previously seen in [place] [facility] in 2022 at that  point felt to have chronic venous insufficiency and lymphedema Past medical history is really quite extensive she has type 2 diabetes with neuropathy but very well controlled with a hemoglobin A1c of 5.8, COPD on chronic oxygen, hyperlipidemia, hypothyroidism, history of right upper lobe lung Alexandria, iron deficiency anemia, gastroesophageal reflux disease/esophagitis, she has been seen in our sister clinic in [place] in the past ABI in our clinic was 1.08. She had an echocardiogram done this year that was reasonably unremarkable. She also had a DVT rule out study that was negative in April of this year 10/5; patient presents for follow-up. She tolerated the compression wrap well. She has been using Iodosorb under 3 layer compression. 10/16; patient presents for follow-up. We have been using endoform under 3 layer compression. She has no issues or complaints today. 10/23; patient presents for follow-up. We have been using silver alginate with antibiotic ointment under 4-layer compression. Patient has no issues or complaints today. Patient History Information obtained from Patient. Family History Cancer - Mother, Heart Disease - Mother, Lung Disease - Father, Thyroid Problems - Siblings, No family history of Diabetes, Hereditary Spherocytosis, Hypertension, Kidney Disease, Seizures, Stroke, Tuberculosis. Social History Former smoker - 59 years - ended on 03/08/2005, Marital Status - Widowed, Alcohol Use - Never, Drug Use - No History, Caffeine Use - Daily. Medical History Eyes Patient has history of Cataracts Denies history of Glaucoma, Optic Neuritis Ear/Nose/Mouth/Throat Patient has history of Chronic sinus problems/congestion Hematologic/Lymphatic Patient has history of Anemia Denies history of Hemophilia, Human Immunodeficiency Virus, Lymphedema, Sickle Cell Disease Respiratory Patient has history of Chronic Obstructive Pulmonary Disease (COPD) Denies history of Aspiration, Asthma,  Pneumothorax, Sleep Apnea, Tuberculosis Cardiovascular Patient has history of Congestive Heart Failure, Coronary Artery Disease Denies history of Angina, Arrhythmia, Deep Vein Thrombosis, Hypertension, Hypotension, Myocardial Infarction, Peripheral Arterial Disease, Peripheral Venous Disease, Phlebitis, Vasculitis Gastrointestinal Denies history of Cirrhosis , Colitis, Crohnoos, Hepatitis A, Hepatitis B, Hepatitis C Endocrine Alexandria [name], Alexandria [name] ([id number]) [id number] Page 5 of 8 Denies history of Type II Diabetes Genitourinary Denies history of End Stage Renal Disease Immunological Denies history of Lupus Erythematosus, Raynaudoos, Scleroderma Integumentary (Skin) Denies history of History of Burn Musculoskeletal Patient has history of Rheumatoid Arthritis Denies history of Gout, Osteoarthritis, Osteomyelitis Neurologic Patient has history of Neuropathy Denies history of Dementia, Quadriplegia, Paraplegia, Seizure Disorder Oncologic Patient has history of Received Radiation Denies history of Received Chemotherapy Psychiatric Denies history of Anorexia/bulimia, Confinement Anxiety Hospitalization/Surgery History - hystertomy. -  both shoulders replaced. - C-section x 2. Medical A Surgical History Notes nd Cardiovascular Heart mummer Endocrine Prediabetic Psychiatric anxiety Objective Constitutional respirations regular, non-labored and within target range for patient.. Vitals Time Taken: 3:35 PM, Height: 65 in, Weight: 219.5 lbs, BMI: 36.5, Temperature: 98.5 F, Pulse: 83 bpm, Respiratory Rate: 22 breaths/min, Blood Pressure: 130/78 mmHg, Pulse Oximetry: 2 %. Cardiovascular 2+ dorsalis pedis/posterior tibialis pulses. Psychiatric pleasant and cooperative. General Notes: Right lower extremity: T the medial posterior calf there is a small open wound with granulation tissue at the opening. Decent edema control. No o signs of surrounding  infection. Integumentary (Hair, Skin) Wound #1 status is Open. Original cause of wound was Trauma. The date acquired was: 11/22/2021. The wound has been in treatment 3 [name]. The wound is located on the Right,Posterior Lower Leg. The wound measures 0.3cm length x 0.2cm width x 0.4cm depth; 0.047cm^2 area and 0.019cm^3 volume. There is Fat Layer (Subcutaneous Tissue) exposed. There is no tunneling or undermining noted. There is a medium amount of serosanguineous drainage noted. The wound margin is distinct with the outline attached to the wound base. There is large (67-100%) red, pink granulation within the wound bed. There is no necrotic tissue within the wound bed. The periwound skin appearance had no abnormalities noted for texture. The periwound skin appearance had no abnormalities noted for moisture. The periwound skin appearance did not exhibit: Atrophie Blanche, Cyanosis, Ecchymosis, Hemosiderin Staining, Mottled, Pallor, Rubor, Erythema. Periwound temperature was noted as No Abnormality. Assessment Active Problems ICD-10 Laceration without foreign body, right lower leg, subsequent encounter Non-pressure chronic ulcer of other part of right lower leg with other specified severity Chronic venous hypertension (idiopathic) with inflammation of right lower extremity Patient's wound has shown slight improvement in size and appearance since last clinic visit. I recommended continuing the course with silver alginate and antibiotic ointment under 4-layer compression. Follow-up in 1 week. Procedures Alexandria [name], Alexandria [name] ([id number]) [id number] Page 6 of 8 Wound #1 Pre-procedure diagnosis of Wound #1 is a Venous Leg Ulcer located on the Right,Posterior Lower Leg . There was a Four Layer Compression Therapy Procedure by Rhae Hammock, RN. Post procedure Diagnosis Wound #1: Same as Pre-Procedure Plan Follow-up Appointments: Return Appointment in 1 week. - with Dr.  Heber [place] Anesthetic: (In clinic) Topical Lidocaine 5% applied to wound bed (In clinic) Topical Lidocaine 4% applied to wound bed Bathing/ Shower/ Hygiene: May shower with protection but do not get wound dressing(s) wet. - Can shower with cast protector bag from [facility], [facility] or [employer] Edema Control - Lymphedema / SCD / Other: Elevate legs to the level of the heart or above for 30 minutes daily and/or when sitting, a frequency of: - throughout the day Avoid standing for long periods of time. Additional Orders / Instructions: Follow Nutritious Diet WOUND #1: - Lower Leg Wound Laterality: Right, Posterior Cleanser: Soap and Water 1 x Per Week/ Discharge Instructions: May shower and wash wound with dial antibacterial soap and water prior to dressing change. Cleanser: Wound Cleanser 1 x Per Week/ Discharge Instructions: Cleanse the wound with wound cleanser prior to applying a clean dressing using gauze sponges, not tissue or cotton balls. Peri-Wound Care: Triamcinolone 15 (g) 1 x Per Week/ Discharge Instructions: Use triamcinolone 15 (g) as directed Peri-Wound Care: Sween Lotion (Moisturizing lotion) 1 x Per Week/ Discharge Instructions: Apply moisturizing lotion as directed Topical: Gentamicin 1 x Per Week/ Discharge Instructions: As directed by physician Topical: Mupirocin Ointment 1 x Per Week/ Discharge Instructions: Apply Mupirocin (Bactroban) as  instructed Prim Dressing: KerraCel Ag Gelling Fiber Dressing, 4x5 in (silver alginate) 1 x Per Week/ ary Discharge Instructions: Apply silver alginate to wound bed as instructed Secondary Dressing: ABD Pad, 8x10 1 x Per Week/ Discharge Instructions: Apply over primary dressing as directed. Com pression Wrap: FourPress (4 layer compression wrap) 1 x Per Week/ Discharge Instructions: Apply four layer compression as directed. May also use Miliken CoFlex 2 layer compression system as alternative. 1. Silver alginate under 4-layer compression 2.  Follow-up in 1 week Electronic Signature(s) Signed: 12/28/2021 4:00:55 PM By: Alexandria Shan DO Entered By: Alexandria [name] on 12/28/2021 16:00:26 -------------------------------------------------------------------------------- HxROS Details Patient Name: Date of Service: Alexandria [name], Alexandria Alexandria LYN E. 12/28/2021 3:30 PM Medical Record Number: [id number] Patient Account Number: [id number] Date of Birth/Sex: Treating RN: Apr 29, 1935 (86 y.o. F) Primary Care Provider: Leonides [name] Other Clinician: Referring Provider: Treating Provider/Extender: Alexandria [name] in Treatment: 3 Information Obtained From Patient Eyes Medical History: Positive for: Cataracts Negative for: Glaucoma; Optic Neuritis Ear/Nose/Mouth/Throat Alexandria [name], Alexandria [name] ([id number]) [id number] Page 7 of 8 Medical History: Positive for: Chronic sinus problems/congestion Hematologic/Lymphatic Medical History: Positive for: Anemia Negative for: Hemophilia; Human Immunodeficiency Virus; Lymphedema; Sickle Cell Disease Respiratory Medical History: Positive for: Chronic Obstructive Pulmonary Disease (COPD) Negative for: Aspiration; Asthma; Pneumothorax; Sleep Apnea; Tuberculosis Cardiovascular Medical History: Positive for: Congestive Heart Failure; Coronary Artery Disease Negative for: Angina; Arrhythmia; Deep Vein Thrombosis; Hypertension; Hypotension; Myocardial Infarction; Peripheral Arterial Disease; Peripheral Venous Disease; Phlebitis; Vasculitis Past Medical History Notes: Heart mummer Gastrointestinal Medical History: Negative for: Cirrhosis ; Colitis; Crohns; Hepatitis A; Hepatitis B; Hepatitis C Endocrine Medical History: Negative for: Type II Diabetes Past Medical History Notes: Prediabetic Genitourinary Medical History: Negative for: End Stage Renal Disease Immunological Medical History: Negative for: Lupus Erythematosus; Raynauds;  Scleroderma Integumentary (Skin) Medical History: Negative for: History of Burn Musculoskeletal Medical History: Positive for: Rheumatoid Arthritis Negative for: Gout; Osteoarthritis; Osteomyelitis Neurologic Medical History: Positive for: Neuropathy Negative for: Dementia; Quadriplegia; Paraplegia; Seizure Disorder Oncologic Medical History: Positive for: Received Radiation Negative for: Received Chemotherapy Psychiatric Medical History: Negative for: Anorexia/bulimia; Confinement Anxiety Past Medical History Notes: anxiety HBO Extended History Items Ear/Nose/Mouth/Throat: Eyes: Chronic sinus Cataracts Alexandria [name], Alexandria [name] ([id number]) [id number] Page 8 of 8 Cataracts problems/congestion Immunizations Pneumococcal Vaccine: Received Pneumococcal Vaccination: Yes Received Pneumococcal Vaccination On or After 60th Birthday: Yes Implantable Devices None Hospitalization / Surgery History Type of Hospitalization/Surgery hystertomy both shoulders replaced C-section x 2 Family and Social History Cancer: Yes - Mother; Diabetes: No; Heart Disease: Yes - Mother; Hereditary Spherocytosis: No; Hypertension: No; Kidney Disease: No; Lung Disease: Yes - Father; Seizures: No; Stroke: No; Thyroid Problems: Yes - Siblings; Tuberculosis: No; Former smoker - 51 years - ended on 03/08/2005; Marital Status - Widowed; Alcohol Use: Never; Drug Use: No History; Caffeine Use: Daily; Financial Concerns: No; Food, Clothing or Shelter Needs: No; Support System Lacking: No; Transportation Concerns: No Electronic Signature(s) Signed: 12/28/2021 4:00:55 PM By: Alexandria Shan DO Entered By: Alexandria [name] on 12/28/2021 15:59:08 -------------------------------------------------------------------------------- SuperBill Details Patient Name: Date of Service: Alexandria [name], Alexandria Alexandria LYN E. 12/28/2021 Medical Record Number: [id number] Patient Account Number: [id number] Date of  Birth/Sex: Treating RN: 05/17/1935 (86 y.o. F) Primary Care Provider: Leonides [name] Other Clinician: Referring Provider: Treating Provider/Extender: Alexandria [name] in Treatment: 3 Diagnosis Coding ICD-10 Codes Code Description [phone] Laceration without foreign body, right lower leg, subsequent encounter L97.818 Non-pressure chronic ulcer of other part of right lower leg with other specified severity I87.321  Chronic venous hypertension (idiopathic) with inflammation of right lower extremity Physician Procedures : CPT4 Code Description Modifier [id number] - WC PHYS LEVEL 3 - EST PT ICD-10 Diagnosis Description S81.811D Laceration without foreign body, right lower leg, subsequent encounter L97.818 Non-pressure chronic ulcer of other part of right lower leg  with other specified severity I87.321 Chronic venous hypertension (idiopathic) with inflammation of right lower extremity Quantity: 1 Electronic Signature(s) Signed: 12/28/2021 4:00:55 PM By: Alexandria Shan DO Entered By: Alexandria [name] on 12/28/2021 16:00:37

## 2021-12-30 ENCOUNTER — Inpatient Hospital Stay (HOSPITAL_BASED_OUTPATIENT_CLINIC_OR_DEPARTMENT_OTHER): Payer: Medicare Other | Admitting: Internal Medicine

## 2021-12-30 ENCOUNTER — Encounter: Payer: Self-pay | Admitting: Internal Medicine

## 2021-12-30 ENCOUNTER — Inpatient Hospital Stay: Payer: Medicare Other

## 2021-12-30 VITALS — BP 141/51 | HR 83 | Resp 16

## 2021-12-30 DIAGNOSIS — D509 Iron deficiency anemia, unspecified: Secondary | ICD-10-CM | POA: Diagnosis not present

## 2021-12-30 DIAGNOSIS — D649 Anemia, unspecified: Secondary | ICD-10-CM

## 2021-12-30 DIAGNOSIS — Z87891 Personal history of nicotine dependence: Secondary | ICD-10-CM | POA: Diagnosis not present

## 2021-12-30 DIAGNOSIS — Z79899 Other long term (current) drug therapy: Secondary | ICD-10-CM | POA: Diagnosis not present

## 2021-12-30 DIAGNOSIS — Z9981 Dependence on supplemental oxygen: Secondary | ICD-10-CM | POA: Diagnosis not present

## 2021-12-30 DIAGNOSIS — J449 Chronic obstructive pulmonary disease, unspecified: Secondary | ICD-10-CM | POA: Diagnosis not present

## 2021-12-30 DIAGNOSIS — Z85118 Personal history of other malignant neoplasm of bronchus and lung: Secondary | ICD-10-CM | POA: Diagnosis not present

## 2021-12-30 LAB — CBC WITH DIFFERENTIAL/PLATELET
Abs Immature Granulocytes: 0.03 10*3/uL (ref 0.00–0.07)
Basophils Absolute: 0 10*3/uL (ref 0.0–0.1)
Basophils Relative: 0 %
Eosinophils Absolute: 0.1 10*3/uL (ref 0.0–0.5)
Eosinophils Relative: 3 %
HCT: 29.3 % — ABNORMAL LOW (ref 36.0–46.0)
Hemoglobin: 8.7 g/dL — ABNORMAL LOW (ref 12.0–15.0)
Immature Granulocytes: 1 %
Lymphocytes Relative: 24 %
Lymphs Abs: 1.2 10*3/uL (ref 0.7–4.0)
MCH: 29.5 pg (ref 26.0–34.0)
MCHC: 29.7 g/dL — ABNORMAL LOW (ref 30.0–36.0)
MCV: 99.3 fL (ref 80.0–100.0)
Monocytes Absolute: 0.4 10*3/uL (ref 0.1–1.0)
Monocytes Relative: 8 %
Neutro Abs: 3.4 10*3/uL (ref 1.7–7.7)
Neutrophils Relative %: 64 %
Platelets: 184 10*3/uL (ref 150–400)
RBC: 2.95 MIL/uL — ABNORMAL LOW (ref 3.87–5.11)
RDW: 15 % (ref 11.5–15.5)
WBC: 5.2 10*3/uL (ref 4.0–10.5)
nRBC: 0 % (ref 0.0–0.2)

## 2021-12-30 LAB — COMPREHENSIVE METABOLIC PANEL
ALT: 11 U/L (ref 0–44)
AST: 15 U/L (ref 15–41)
Albumin: 3.5 g/dL (ref 3.5–5.0)
Alkaline Phosphatase: 65 U/L (ref 38–126)
Anion gap: 4 — ABNORMAL LOW (ref 5–15)
BUN: 23 mg/dL (ref 8–23)
CO2: 33 mmol/L — ABNORMAL HIGH (ref 22–32)
Calcium: 8.6 mg/dL — ABNORMAL LOW (ref 8.9–10.3)
Chloride: 105 mmol/L (ref 98–111)
Creatinine, Ser: 0.88 mg/dL (ref 0.44–1.00)
GFR, Estimated: >60 mL/min (ref >60)
Glucose, Bld: 96 mg/dL (ref 70–99)
Potassium: 4.8 mmol/L (ref 3.5–5.1)
Sodium: 142 mmol/L (ref 135–145)
Total Bilirubin: 0.4 mg/dL (ref 0.3–1.2)
Total Protein: 6.9 g/dL (ref 6.5–8.1)

## 2021-12-30 LAB — IRON AND TIBC
Iron: 39 ug/dL (ref 28–170)
Saturation Ratios: 12 % (ref 10.4–31.8)
TIBC: 330 ug/dL (ref 250–450)
UIBC: 291 ug/dL

## 2021-12-30 LAB — FERRITIN: Ferritin: 35 ng/mL (ref 11–307)

## 2021-12-30 MED ORDER — SODIUM CHLORIDE 0.9 % IV SOLN
200.0000 mg | Freq: Once | INTRAVENOUS | Status: AC
  Administered 2021-12-30: 200 mg via INTRAVENOUS
  Filled 2021-12-30: qty 200

## 2021-12-30 MED ORDER — SODIUM CHLORIDE 0.9 % IV SOLN
Freq: Once | INTRAVENOUS | Status: AC
  Filled 2021-12-30: qty 250

## 2021-12-30 MED ORDER — METHYLPREDNISOLONE SODIUM SUCC 125 MG IJ SOLR
40.0000 mg | Freq: Once | INTRAMUSCULAR | Status: AC
  Administered 2021-12-30: 40 mg via INTRAVENOUS
  Filled 2021-12-30: qty 2

## 2021-12-30 NOTE — Progress Notes (Signed)
Patient has wound on right leg that is being treated at wound center.

## 2021-12-30 NOTE — Assessment & Plan Note (Addendum)
#   Iron deficiency Anemia [OCT 2022- Ferritin-7]; Symptomatic -chronic intermittent [SINCE 2010]; s/p IV Venofer.  # Worsening anemia: Hemoglobin today - 8.7; improved from 7.09 Nov 2021.  Proceed with iron infusions x4  including 1 today.   # ETIOLOGY:  UNCLEAR- CT CT AP- JAN- 11th, 2023- Mild wall thickening of the proximal duodenum at least in part related to under distension, may reflect peptic ulcer disease/duodenitis.  Status post evaluation with Dr. Ardis Hughs in [place] 2023]-as per patient patient is not a candidate for any upper and lower endoscopies.  So monitor for now  I spoke at length with the patient's daughter over the phone- regarding the patient's clinical status/plan of care.  The daughter in agreement.      # DISPOSITION: [wed pref] # Venofer today # venofer weekly x 4 ## follow up 6 weeks NP;  cbc/bmp; possible-- venofer- Dr.B

## 2021-12-30 NOTE — Progress Notes (Signed)
[facility] NOTE  Patient Care Team: Tonia Ghent, MD as PCP - General Rockey Situ, Kathlene November, MD as Consulting Physician (Cardiology) [name], NP as Nurse Practitioner (Adult Health Nurse Practitioner) Cammie Sickle, MD as Consulting Physician (Oncology)  CHIEF COMPLAINTS/PURPOSE OF CONSULTATION: ANEMIA   HEMATOLOGY HISTORY:  # CHRONIC INTERMITTENT ANEMIA  [since 2010]- Hb SEP 2022- 8; previously on PO Iron- ; WBC/platelets- Normal; MCV- 90s. EGD- > 15 years; /Colonoscopy:4 years- [Dr.Jacob;; in [place]] capsule-? Bone marrow Biopsy-none; OCT 2022- SEVERE IRON DEFICIENCY; myeloma work-up/hemolysis work-up negative; JAN 2023-CT scan duodenal thickening-recommend GI evaluation  #   AUG 2021- RIGHT LUNG stage I non-small cell lung cancer-  s/p SBRT   [Dr.Moody; [place]]; NOV 2022- CT chest-no recurrent disease.  # COPD on 2.5 lit/day [Dr.Sood]; diastolic CHF/ OCT 6553-[facility] BP [needing to come off losartan- Dr.Gollan]; peripheral neuropathy.  HISTORY OF PRESENTING ILLNESS: Alone; 2.5 Lit/ O2 24 x7. In wheel chair.  Ice book to the patient's daughter over the phone  Alexandria [name] 86 y.o.  female with chronic respiratory failure on oxygen and severe anemia secondary to iron deficiency of unclear etiology s/p venofer is here for follow-up.  Patient also has been recently.  She is quite distraught. patient has wound on right leg that is being treated at wound center.  Patient continues to complain of ongoing fatigue.  She continues to deny any blood in stools or black-colored stools.    Review of Systems  Constitutional:  Positive for malaise/fatigue. Negative for chills, diaphoresis, fever and weight loss.  HENT:  Negative for nosebleeds and sore throat.   Eyes:  Negative for double vision.  Respiratory:  Positive for cough and shortness of breath. Negative for hemoptysis, sputum production and wheezing.   Cardiovascular:  Negative for chest pain,  palpitations, orthopnea and leg swelling.  Gastrointestinal:  Negative for abdominal pain, blood in stool, constipation, diarrhea, heartburn, melena, nausea and vomiting.  Genitourinary:  Negative for dysuria, frequency and urgency.  Musculoskeletal:  Negative for back pain and joint pain.  Skin: Negative.  Negative for itching and rash.  Neurological:  Positive for dizziness. Negative for tingling, focal weakness, weakness and headaches.  Endo/Heme/Allergies:  Does not bruise/bleed easily.  Psychiatric/Behavioral:  Negative for depression. The patient is not nervous/anxious and does not have insomnia.     MEDICAL HISTORY:  Past Medical History:  Diagnosis Date   Allergy, unspecified not elsewhere classified    Anxiety    Cataract    Chronic diastolic congestive heart failure (HCC)    COPD (chronic obstructive pulmonary disease) ([place]) 03/08/1998   PFTs 12/12/2002 FEV 1 1.42 (64%) ratio 58 with no better after B2 and DLCO75%; PFTs 11/20/09 FEV1 1.50 (73%) ratio 50 no better after B2 with DLCO 62%; Hfa 50% 11/20/2009 >75%, 01/13/10 p coaching   Depression 12/07/1998   Diabetes mellitus type II 02/05/2006   Dr. Cruzita Lederer with endo   Disorders of bursae and tendons in shoulder region, unspecified    Rotator cuff syndrome, right   E. coli bacteremia    Esophagitis    GERD (gastroesophageal reflux disease)    History of UTI    Hyperlipidemia 10/04/2000   Hypertension 03/08/1992   Hypothyroidism 03/08/1968   Iron deficiency anemia    Lung nodule    radiation starts 10-08-2019   Malignant neoplasm of right upper lobe of lung ([place]) 07/24/2019   Obesity    NOS   OSA (obstructive sleep apnea)    PSG 01/27/10  AHI 13, pt does not know CPAP settings   Peripheral neuropathy    Likely due to DM per Dr. Murvin Natal hernia     SURGICAL HISTORY: Past Surgical History:  Procedure Laterality Date   ABD U/S  03/19/1999   Nml x2 foci in liver   ADENOSINE MYOVIEW  06/02/2007   Nml   CARDIOLITE  PERSANTINE  08/24/2000   Nml   CAROTID U/S  08/24/2000   1-39% ICA stenosis   CAROTID U/S  06/02/2007   No apprec change    CARPAL TUNNEL RELEASE  12/1997   Right   CESAREAN SECTION     x2 [place]/ repeat   CHOLECYSTECTOMY  1997   COLONOSCOPY WITH PROPOFOL N/A 02/12/2016   Procedure: COLONOSCOPY WITH PROPOFOL;  Surgeon: Milus Banister, MD;  Location: [facility];  Service: Endoscopy;  Laterality: N/A;   CT ABD W & PELVIS WO/W CM     Abd hemangiomas of liver, 1 cm R renal cyst   DENTAL SURGERY  2016   Implants   DEXA  07/03/2003   Nml   ESOPHAGOGASTRODUODENOSCOPY  12/05/1997   Nml (due to hoarseness)   ESOPHAGOGASTRODUODENOSCOPY (EGD) WITH PROPOFOL N/A 02/12/2016   Procedure: ESOPHAGOGASTRODUODENOSCOPY (EGD) WITH PROPOFOL;  Surgeon: Milus Banister, MD;  Location: [facility];  Service: Endoscopy;  Laterality: N/A;   GALLBLADDER SURGERY     HERNIA REPAIR  01/24/2009   Lap Ventr w/ Lysis of adhesions (Dr. Donne Hazel)   knee arthroscopic surgery  years ago   right   ROTATOR CUFF REPAIR  1984   Right, [place]   SHOULDER OPEN ROTATOR CUFF REPAIR  02/08/2012   Procedure: ROTATOR CUFF REPAIR SHOULDER OPEN;  Surgeon: Magnus Sinning, MD;  Location: [facility];  Service: Orthopedics;  Laterality: Left;  Left Shoulder Open Anterior Acrominectomy Rotator Cuff Repair Open Distal Clavicle Resection ,tissue mend graft, and repair of biceps tendon   THUMB RELEASE  12/1997   Right   TONSILLECTOMY     TOTAL ABDOMINAL HYSTERECTOMY  1985   Due to dysmennorhea   US ECHOCARDIOGRAPHY  06/02/2007    SOCIAL HISTORY: Social History   Socioeconomic History   Marital status: Widowed    Spouse name: Not on file   Number of children: 2   Years of education: Not on file   Highest education level: Not on file  Occupational History   Occupation: Retired    Fish farm manager: RETIRED  Tobacco Use   Smoking status: Former    Packs/day: 2.00    Years: 50.00    Total pack years: 100.00    Types:  Cigarettes    Quit date: 02/05/2005    Years since quitting: 16.9   Smokeless tobacco: Never  Vaping Use   Vaping Use: Never used  Substance and Sexual Activity   Alcohol use: Yes    Comment: occasionally   Drug use: Never   Sexual activity: Not Currently  Other Topics Concern   Not on file  Social History Narrative   Widowed 2023, 2 children; Enjoys painting      Quit smoking in 2007; no alcohol; lives in [place]; [name]/d lives in [place]. Had own business. Dont drive- sec to neuropathy.    Social Determinants of Health   Financial Resource Strain: Not on file  Food Insecurity: Not on file  Transportation Needs: Not on file  Physical Activity: Not on file  Stress: Not on file  Social Connections: Not on file  Intimate Partner Violence: Not on file  FAMILY HISTORY: Family History  Problem Relation Age of Onset   Heart disease Mother    Thyroid disease Mother    Emphysema Father        One lung   Cystic fibrosis Sister    Hyperthyroidism Sister    Osteoarthritis Brother    Hyperthyroidism Brother    Esophageal cancer Neg Hx    Cancer Neg Hx        Head or neck   Colon cancer Neg Hx    Stomach cancer Neg Hx    Breast cancer Neg Hx     ALLERGIES:  is allergic to antihistamines, diphenhydramine-type; sulfonamide derivatives; incruse ellipta [umeclidinium bromide]; clarithromycin; codeine; lyrica [pregabalin]; spiriva handihaler [tiotropium bromide monohydrate]; and vraylar [cariprazine].  MEDICATIONS:  Current Outpatient Medications  Medication Sig Dispense Refill   albuterol (PROAIR HFA) 108 (90 Base) MCG/ACT inhaler Inhale 2 puffs into the lungs every 6 (six) hours as needed for wheezing or shortness of breath. INHALE 2 PUFFS INTO THE LUNGS EVERY 6 HOURS AS NEEDED FOR WHEEZING OR SHORTNESS OF BREATH 3 Inhaler 3   arformoterol (BROVANA) 15 MCG/2ML NEBU Take 2 mLs (15 mcg total) by nebulization at bedtime. 120 mL 6   budesonide (PULMICORT) 0.5 MG/2ML nebulizer  solution Take 2 mLs (0.5 mg total) by nebulization in the morning and at bedtime. 360 mL 3   busPIRone (BUSPAR) 15 MG tablet Take 15 mg by mouth 2 (two) times daily.     Cholecalciferol (VITAMIN D) 50 MCG (2000 UT) tablet Take 1 tablet (2,000 Units total) by mouth daily.     Coenzyme Q-10 200 MG CAPS Take 200 mg by mouth daily.     Cyanocobalamin (B-12) 5000 MCG CAPS Take 5,000 mcg by mouth daily.     cyclobenzaprine (FLEXERIL) 10 MG tablet Take 1 tablet (10 mg total) by mouth at bedtime. 10 tablet 0   esomeprazole (NEXIUM) 40 MG capsule Take 1 capsule (40 mg total) by mouth daily at 12 noon. 30 capsule 11   ezetimibe (ZETIA) 10 MG tablet TAKE 1 TABLET BY MOUTH AT BEDTIME 90 tablet 2   fexofenadine (ALLEGRA) 180 MG tablet Take 180 mg by mouth as needed.      FLUoxetine (PROZAC) 20 MG tablet Take 20 mg by mouth 2 (two) times daily.     fluticasone (FLONASE) 50 MCG/ACT nasal spray USE 2 SPRAYS INTO THE NOSE EVERY 12 HOURS AS NEEDED FOR STUFFY NOSE 48 mL 2   furosemide (LASIX) 20 MG tablet Take 1-2 tablets (20-40 mg total) by mouth daily as needed for fluid. 90 tablet 1   glucose blood (ONETOUCH VERIO) test strip USE AS INSTRUCTED TO CHECK SUGAR 1 TIME DAILY 100 strip 8   guaiFENesin (MUCINEX) 600 MG 12 hr tablet Take 2 tablets (1,200 mg total) by mouth 2 (two) times daily as needed for cough or to loosen phlegm.     Iron, Ferrous Sulfate, 325 (65 Fe) MG TABS Take 325 mg by mouth daily. 90 tablet 3   levothyroxine (SYNTHROID) 125 MCG tablet Take 1 tablet (125 mcg total) by mouth daily. 90 tablet 3   metFORMIN (GLUCOPHAGE) 500 MG tablet Take 1 tablet (500 mg total) by mouth every evening. 90 tablet 3   nitroGLYCERIN (NITROSTAT) 0.4 MG SL tablet Place 1 tablet (0.4 mg total) under the tongue every 5 (five) minutes as needed for chest pain. 25 tablet 3   ONETOUCH DELICA LANCETS FINE MISC USE TO CHECK SUGAR 1 TIME DAILY 100 each 5  rosuvastatin (CRESTOR) 10 MG tablet Take 1 tablet (10 mg total) by mouth  every other day. 45 tablet 2   Spacer/Aero Chamber Mouthpiece MISC Use with  inhaler as needed.  J44.9 1 each 0   No current facility-administered medications for this visit.      PHYSICAL EXAMINATION:   Vitals:   12/30/21 1400  BP: (!) 157/70  Pulse: 72  Resp: 18  Temp: 98.8 F (37.1 C)   Filed Weights   12/30/21 1400  Weight: 221 lb (100.2 kg)    Physical Exam Vitals and nursing note reviewed.  HENT:     Head: Normocephalic and atraumatic.     Mouth/Throat:     Pharynx: Oropharynx is clear.  Eyes:     Extraocular Movements: Extraocular movements intact.     Pupils: Pupils are equal, round, and reactive to light.  Cardiovascular:     Rate and Rhythm: Normal rate and regular rhythm.  Pulmonary:     Comments: Decreased breath sounds bilaterally.  Scattered wheezing. Abdominal:     Palpations: Abdomen is soft.  Musculoskeletal:        General: Normal range of motion.     Cervical back: Normal range of motion.  Skin:    General: Skin is warm.  Neurological:     General: No focal deficit present.     Mental Status: She is alert and oriented to person, place, and time.  Psychiatric:        Behavior: Behavior normal.        Judgment: Judgment normal.     LABORATORY DATA:  I have reviewed the data as listed Lab Results  Component Value Date   WBC 5.2 12/30/2021   HGB 8.7 (L) 12/30/2021   HCT 29.3 (L) 12/30/2021   MCV 99.3 12/30/2021   PLT 184 12/30/2021   Recent Labs    06/08/21 1547 07/24/21 1252 09/23/21 1353 11/12/21 1422 11/25/21 1319 12/30/21 1412  NA 142   < > 140  --  138 142  K 4.7   < > 4.2  --  4.1 4.8  CL 103   < > 104  --  101 105  CO2 31   < > 31  --  33* 33*  GLUCOSE 91   < > 171*  --  154* 96  BUN 21   < > 24*  --  20 23  CREATININE 0.80   < > 0.80 0.80 0.69 0.88  CALCIUM 9.6   < > 8.3*  --  8.6* 8.6*  GFRNONAA  --    < > >60  --  >60 >60  PROT 6.9  --   --   --   --  6.9  ALBUMIN 3.9  --   --   --   --  3.5  AST 13  --   --    --   --  15  ALT 7  --   --   --   --  11  ALKPHOS 67  --   --   --   --  65  BILITOT 0.4  --   --   --   --  0.4   < > = values in this interval not displayed.     No results found.  Symptomatic anemia  # Iron deficiency Anemia [OCT 2022- Ferritin-7]; Symptomatic -chronic intermittent [SINCE 2010]; s/p IV Venofer.  # Worsening anemia: Hemoglobin today - 8.7; improved from 7.09 Nov 2021.  Proceed with iron  infusions x4  including 1 today.   # ETIOLOGY:  UNCLEAR- CT CT AP- JAN- 11th, 2023- Mild wall thickening of the proximal duodenum at least in part related to under distension, may reflect peptic ulcer disease/duodenitis.  Status post evaluation with Dr. Ardis Hughs in [place] 2023]-as per patient patient is not a candidate for any upper and lower endoscopies.  So monitor for now  I spoke at length with the patient's daughter over the phone- regarding the patient's clinical status/plan of care.  The daughter in agreement.      # DISPOSITION: [wed pref] # Venofer today # venofer weekly x 4 ## follow up 6 weeks NP;  cbc/bmp; possible-- venofer- Dr.B     All questions were answered. The patient knows to call the clinic with any problems, questions or concerns.    Cammie Sickle, MD 12/30/2021 11:16 PM

## 2021-12-31 ENCOUNTER — Telehealth: Payer: Self-pay | Admitting: Family Medicine

## 2021-12-31 MED ORDER — BUSPIRONE HCL 15 MG PO TABS: 15.0000 mg | ORAL_TABLET | Freq: Two times a day (BID) | ORAL | Status: DC

## 2021-12-31 NOTE — Telephone Encounter (Signed)
Spoke with patient and advised on below. Patient verbalized understanding and thanked [place].

## 2021-12-31 NOTE — Telephone Encounter (Signed)
I would be reasonable to try an extra 1/2 tab of buspar midday.  That would be 1 tab AM, 1/2 tab midday, 1 tab PM.  She could do that for a few days in the meantime and see if that helped.  Please give my condolences.  Thanks.

## 2021-12-31 NOTE — Progress Notes (Signed)
KAYSLEE, FUREY ([id number]) [id number] Page 1 of 8 Visit Report for 12/28/2021 Arrival Information Details Patient Name: Date of Service: Alexandria [name], [place] [name]. 12/28/2021 3:30 PM Medical Record Number: [id number] Patient Account Number: [id number] Date of Birth/Sex: Treating RN: 06-Dec-1935 (86 Alexandria [name].o. Alexandria [name], Alexandria [name] Primary Care Pete Schnitzer: Leonides Cave Other Clinician: Referring Mekala Winger: Treating Thomas Mabry/Extender: Valda Lamb Weeks in Treatment: 3 Visit Information History Since Last Visit Added or deleted any medications: No Patient Arrived: [name] Any new allergies or adverse reactions: No Arrival Time: 15:28 Had a fall or experienced change in No Accompanied By: self activities of daily living that may affect Transfer Assistance: None risk of falls: Patient Identification Verified: Yes Signs or symptoms of abuse/neglect since last visito No Secondary Verification Process Completed: Yes Hospitalized since last visit: No Patient Requires Transmission-Based Precautions: No Implantable device outside of the clinic excluding No Patient Has Alerts: No cellular tissue based products placed in the center since last visit: Has Dressing in Place as Prescribed: Yes Has Compression in Place as Prescribed: Yes Pain Present Now: No Electronic Signature(s) Signed: 12/28/2021 5:18:37 PM By: Deon Pilling RN, BSN Entered By: Deon Pilling on 12/28/2021 15:29:03 -------------------------------------------------------------------------------- Compression Therapy Details Patient Name: Date of Service: Alexandria [name], [place] [name]. 12/28/2021 3:30 PM Medical Record Number: [id number] Patient Account Number: [id number] Date of Birth/Sex: Treating RN: 12/15/1935 (86 Alexandria [name].o. Alexandria [name], Alexandria [name] Primary Care Abbygale Lapid: Leonides Cave Other Clinician: Referring Anyae Griffith: Treating Lonie Rummell/Extender: Valda Lamb Weeks in  Treatment: 3 Compression Therapy Performed for Wound Assessment: Wound #1 Right,Posterior Lower Leg Performed By: Clinician Rhae Hammock, RN Compression Type: Four Layer Post Procedure Diagnosis Same as Pre-procedure Electronic Signature(s) Signed: 12/31/2021 4:44:37 PM By: Rhae Hammock RN Entered By: Rhae Hammock on 12/28/2021 15:54:31 Alexandria [name] ([id number]) [id number] Page 2 of 8 -------------------------------------------------------------------------------- Encounter Discharge Information Details Patient Name: Date of Service: Alexandria [name], [place] [name]. 12/28/2021 3:30 PM Medical Record Number: [id number] Patient Account Number: [id number] Date of Birth/Sex: Treating RN: 25-Jul-1935 (37 Alexandria [name].o. Alexandria [name], Alexandria [name] Primary Care Alleyne Lac: Leonides Cave Other Clinician: Referring Kanan Sobek: Treating Tanzie Rothschild/Extender: Valda Lamb Weeks in Treatment: 3 Encounter Discharge Information Items Discharge Condition: Stable Ambulatory Status: Ambulatory Discharge Destination: Home Transportation: Private Auto Accompanied By: self Schedule Follow-up Appointment: Yes Clinical Summary of Care: Patient Declined Electronic Signature(s) Signed: 12/31/2021 4:44:37 PM By: Rhae Hammock RN Entered By: Rhae Hammock on 12/29/2021 10:35:33 -------------------------------------------------------------------------------- Lower Extremity Assessment Details Patient Name: Date of Service: Alexandria [name], [place] [name]. 12/28/2021 3:30 PM Medical Record Number: [id number] Patient Account Number: [id number] Date of Birth/Sex: Treating RN: Apr 01, 1935 (69 Alexandria [name].o. Alexandria [name], Alexandria [name] Primary Care Rainier Feuerborn: Leonides Cave Other Clinician: Referring Derrick Tiegs: Treating Torianna Junio/Extender: Valda Lamb Weeks in Treatment: 3 Edema Assessment Assessed: [Left: No] [Right: Yes] Edema: [Left: Ye] [Right: s] Calf Left:  Right: Point of Measurement: 29 cm From Medial Instep 36.5 cm Ankle Left: Right: Point of Measurement: 9 cm From Medial Instep 22 cm Vascular Assessment Pulses: Dorsalis Pedis Palpable: [Right:Yes] Electronic Signature(s) Signed: 12/28/2021 5:18:37 PM By: Deon Pilling RN, BSN Entered By: Deon Pilling on 12/28/2021 15:34:22 Multi Wound Chart Details -------------------------------------------------------------------------------- Alexandria [name] ([id number]) [id number] Page 3 of 8 Patient Name: Date of Service: Alexandria [name], [place] [name]. 12/28/2021 3:30 PM Medical Record Number: [id number] Patient Account Number: [id number] Date of Birth/Sex: Treating RN: [birth year] (89 Alexandria [name].o. F) Primary Care Laurice Kimmons: Leonides Cave Other Clinician: Referring  Taejon Irani: Treating Lunden Mcleish/Extender: Valda Lamb Weeks in Treatment: 3 Vital Signs Height(in): 65 Pulse(bpm): 82 Weight(lbs): 219.5 Blood Pressure(mmHg): 130/78 Body Mass Index(BMI): 36.5 Temperature(F): 98.5 Respiratory Rate(breaths/min): 22 Wound Assessments Wound Number: 1 N/A N/A Photos: No Photos N/A N/A Right, Posterior Lower Leg N/A N/A Wound Location: Trauma N/A N/A Wounding Event: Venous Leg Ulcer N/A N/A Primary Etiology: Cataracts, Chronic sinus N/A N/A Comorbid History: problems/congestion, Anemia, Chronic Obstructive Pulmonary Disease (COPD), Congestive Heart Failure, Coronary Artery Disease, Rheumatoid Arthritis, Neuropathy, Received Radiation 11/22/2021 N/A N/A Date Acquired: 3 N/A N/A Weeks of Treatment: Open N/A N/A Wound Status: No N/A N/A Wound Recurrence: 0.3x0.2x0.4 N/A N/A Measurements L x W x D (cm) 0.047 N/A N/A A (cm) : rea 0.019 N/A N/A Volume (cm) : 89.30% N/A N/A % Reduction in Area: 89.20% N/A N/A % Reduction in Volume: Full Thickness Without Exposed N/A N/A Classification: Support Structures Medium N/A N/A Exudate  Amount: Serosanguineous N/A N/A Exudate Type: red, brown N/A N/A Exudate Color: Distinct, outline attached N/A N/A Wound Margin: Large (67-100%) N/A N/A Granulation Amount: Red, Pink N/A N/A Granulation Quality: None Present (0%) N/A N/A Necrotic Amount: Fat Layer (Subcutaneous Tissue): Yes N/A N/A Exposed Structures: Fascia: No Tendon: No Muscle: No Joint: No Bone: No Medium (34-66%) N/A N/A Epithelialization: Excoriation: No N/A N/A Periwound Skin Texture: Induration: No Callus: No Crepitus: No Rash: No Scarring: No Maceration: No N/A N/A Periwound Skin Moisture: Dry/Scaly: No Atrophie Blanche: No N/A N/A Periwound Skin Color: Cyanosis: No Ecchymosis: No Erythema: No Hemosiderin Staining: No Mottled: No Pallor: No Rubor: No No Abnormality N/A N/A Temperature: Compression Therapy N/A N/A Procedures Performed: Treatment Notes Electronic Signature(s) Signed: 12/28/2021 4:00:55 PM By: Kalman Shan DO Entered By: Kalman Shan on 12/28/2021 15:57:21 Alexandria [name] ([id number]) [id number] Page 4 of 8 -------------------------------------------------------------------------------- Multi-Disciplinary Care Plan Details Patient Name: Date of Service: Alexandria [name], [place] [name]. 12/28/2021 3:30 PM Medical Record Number: [id number] Patient Account Number: [id number] Date of Birth/Sex: Treating RN: 1935/10/11 (44 Alexandria [name].o. Alexandria [name], Alexandria [name] Primary Care Victoria Euceda: Leonides Cave Other Clinician: Referring Cira Deyoe: Treating Cala Kruckenberg/Extender: Valda Lamb Weeks in Treatment: 3 Active Inactive Venous Leg Ulcer Nursing Diagnoses: Actual venous Insuffiency (use after diagnosis is confirmed) Goals: Patient will maintain optimal edema control Date Initiated: 12/03/2021 Target Resolution Date: 12/31/2021 Goal Status: Active Interventions: Assess peripheral edema status every visit. Compression as  ordered Provide education on venous insufficiency Treatment Activities: Therapeutic compression applied : 12/03/2021 Notes: Wound/Skin Impairment Nursing Diagnoses: Impaired tissue integrity Goals: Patient/caregiver will verbalize understanding of skin care regimen Date Initiated: 12/03/2021 Target Resolution Date: 12/31/2021 Goal Status: Active Ulcer/skin breakdown will have a volume reduction of 30% by week 4 Date Initiated: 12/03/2021 Target Resolution Date: 12/31/2021 Goal Status: Active Interventions: Assess patient/caregiver ability to obtain necessary supplies Assess patient/caregiver ability to perform ulcer/skin care regimen upon admission and as needed Assess ulceration(s) every visit Provide education on ulcer and skin care Treatment Activities: Topical wound management initiated : 12/03/2021 Notes: Electronic Signature(s) Signed: 12/31/2021 4:44:37 PM By: Rhae Hammock RN Entered By: Rhae Hammock on 12/29/2021 10:34:03 Pain Assessment Details -------------------------------------------------------------------------------- Alexandria [name] ([id number]) [id number] Page 5 of 8 Patient Name: Date of Service: Alexandria [name], [place] [name]. 12/28/2021 3:30 PM Medical Record Number: [id number] Patient Account Number: [id number] Date of Birth/Sex: Treating RN: [birth year] (54 Alexandria [name].o. Alexandria [name], Alexandria [name] Primary Care Buelah Rennie: Leonides Cave Other Clinician: Referring [name] Noone: Treating Malaysha Arlen/Extender: Valda Lamb Weeks in Treatment: 3 Active  Problems Location of Pain Severity and Description of Pain Patient Has Paino No Site Locations Rate the pain. Current Pain Level: 0 Pain Management and Medication Current Pain Management: Medication: No Cold Application: No Rest: No Massage: No Activity: No T.TaylorN.S.: No Heat Application: No Leg drop or elevation: No Is the Current Pain Management Adequate: Adequate How does  your wound impact your activities of daily livingo Sleep: No Bathing: No Appetite: No Relationship With Others: No Bladder Continence: No Emotions: No Bowel Continence: No Work: No Toileting: No Drive: No Dressing: No Hobbies: No Engineer, maintenance) Signed: 12/28/2021 5:18:37 PM By: Deon Pilling RN, BSN Entered By: Deon Pilling on 12/28/2021 15:29:14 -------------------------------------------------------------------------------- Patient/Caregiver Education Details Patient Name: Date of Service: Alexandria [name], [place] [name]. 10/23/2023andnbsp3:30 PM Medical Record Number: [id number] Patient Account Number: [id number] Date of Birth/Gender: Treating RN: 19-Sep-1935 (11 Alexandria [name].o. Alexandria [name], Alexandria [name] Primary Care Physician: Leonides Cave Other Clinician: Referring Physician: Treating Physician/Extender: Alexandria [name] in Treatment: 3 Education Assessment Education Provided To: Patient YARIELA, TISON ([id number]) [id number] Page 6 of 8 Education Topics Provided Venous: Methods: Explain/Verbal Responses: State content correctly Electronic Signature(s) Signed: 12/31/2021 4:44:37 PM By: Rhae Hammock RN Entered By: Rhae Hammock on 12/29/2021 10:34:13 -------------------------------------------------------------------------------- Wound Assessment Details Patient Name: Date of Service: Alexandria [name], [place] [name]. 12/28/2021 3:30 PM Medical Record Number: [id number] Patient Account Number: [id number] Date of Birth/Sex: Treating RN: [birth year] (29 Alexandria [name].o. Alexandria [name], Alexandria [name] Primary Care Tifanie Gardiner: Leonides Cave Other Clinician: Referring Damarko Stitely: Treating Shantavia Jha/Extender: Valda Lamb Weeks in Treatment: 3 Wound Status Wound Number: 1 Primary Venous Leg Ulcer Etiology: Wound Location: Right, Posterior Lower Leg Wound Open Wounding Event: Trauma Status: Date Acquired: 11/22/2021 Comorbid  Cataracts, Chronic sinus problems/congestion, Anemia, Chronic Weeks Of Treatment: 3 History: Obstructive Pulmonary Disease (COPD), Congestive Heart Failure, Clustered Wound: No Coronary Artery Disease, Rheumatoid Arthritis, Neuropathy, Received Radiation Wound Measurements Length: (cm) 0.3 Width: (cm) 0.2 Depth: (cm) 0.4 Area: (cm) 0.047 Volume: (cm) 0.019 % Reduction in Area: 89.3% % Reduction in Volume: 89.2% Epithelialization: Medium (34-66%) Tunneling: No Undermining: No Wound Description Classification: Full Thickness Without Exposed Suppor Wound Margin: Distinct, outline attached Exudate Amount: Medium Exudate Type: Serosanguineous Exudate Color: red, brown t Structures Foul Odor After Cleansing: No Slough/Fibrino No Wound Bed Granulation Amount: Large (67-100%) Exposed Structure Granulation Quality: Red, Pink Fascia Exposed: No Necrotic Amount: None Present (0%) Fat Layer (Subcutaneous Tissue) Exposed: Yes Tendon Exposed: No Muscle Exposed: No Joint Exposed: No Bone Exposed: No Periwound Skin Texture Texture Color No Abnormalities Noted: Yes No Abnormalities Noted: No Atrophie Blanche: No Moisture Cyanosis: No No Abnormalities Noted: Yes Ecchymosis: No Erythema: No Hemosiderin Staining: No Mottled: No Pallor: No Rubor: No Temperature / Pain Temperature: No Abnormality Alexandria [name], Alexandria [name] ([id number]) [id number] Page 7 of 8 Treatment Notes Wound #1 (Lower Leg) Wound Laterality: Right, Posterior Cleanser Soap and Water Discharge Instruction: May shower and wash wound with dial antibacterial soap and water prior to dressing change. Wound Cleanser Discharge Instruction: Cleanse the wound with wound cleanser prior to applying a clean dressing using gauze sponges, not tissue or cotton balls. Peri-Wound Care Triamcinolone 15 (g) Discharge Instruction: Use triamcinolone 15 (g) as directed Sween Lotion (Moisturizing  lotion) Discharge Instruction: Apply moisturizing lotion as directed Topical Gentamicin Discharge Instruction: As directed by physician Mupirocin Ointment Discharge Instruction: Apply Mupirocin (Bactroban) as instructed Primary Dressing KerraCel Ag Gelling Fiber Dressing, 4x5 in (silver alginate) Discharge Instruction: Apply silver alginate to wound bed as instructed  Secondary Dressing ABD Pad, 8x10 Discharge Instruction: Apply over primary dressing as directed. Secured With Compression Wrap FourPress (4 layer compression wrap) Discharge Instruction: Apply four layer compression as directed. May also use Miliken CoFlex 2 layer compression system as alternative. Compression Stockings Add-Ons Electronic Signature(s) Signed: 12/28/2021 5:18:37 PM By: Deon Pilling RN, BSN Entered By: Deon Pilling on 12/28/2021 15:34:10 -------------------------------------------------------------------------------- Vitals Details Patient Name: Date of Service: Alexandria [name], [place] [name]. 12/28/2021 3:30 PM Medical Record Number: [id number] Patient Account Number: [id number] Date of Birth/Sex: Treating RN: 04-Jul-1935 (40 Alexandria [name].o. Alexandria [name], Alexandria [name] Primary Care Laurianne Floresca: Leonides Cave Other Clinician: Referring Almarosa Bohac: Treating Giorgia Wahler/Extender: Valda Lamb Weeks in Treatment: 3 Vital Signs Time Taken: 15:35 Temperature (F): 98.5 Height (in): 65 Pulse (bpm): 83 Weight (lbs): 219.5 Respiratory Rate (breaths/min): 22 Body Mass Index (BMI): 36.5 Blood Pressure (mmHg): 130/78 Reference Range: 80 - 120 mg / dl Airway Pulse Oximetry (%): 2 Electronic Signature(s) MONEE, DEMBECK ([id number]) [id number] Page 8 of 8 Signed: 12/28/2021 5:18:37 PM By: Deon Pilling RN, BSN Entered By: Deon Pilling on 12/28/2021 15:35:27

## 2021-12-31 NOTE — Telephone Encounter (Signed)
Patient daughter called in is requesting a phone call from [name] . She stated that her mom is having to attend the funeral of her husband this weekend ,and she would like to know if it would be safe to give her mom a little extra of either medicationsbusPIRone (BUSPAR) 15 MG tablet or FLUoxetine (PROZAC) 20 MG tablet to help keep her calm? Please advise.

## 2021-12-31 NOTE — Addendum Note (Signed)
Addended by: Tonia Ghent on: 12/31/2021 02:14 PM   Modules accepted: Orders

## 2022-01-04 ENCOUNTER — Encounter (HOSPITAL_BASED_OUTPATIENT_CLINIC_OR_DEPARTMENT_OTHER): Payer: Medicare Other | Admitting: Internal Medicine

## 2022-01-04 DIAGNOSIS — L97818 Non-pressure chronic ulcer of other part of right lower leg with other specified severity: Secondary | ICD-10-CM

## 2022-01-04 DIAGNOSIS — S81811D Laceration without foreign body, right lower leg, subsequent encounter: Secondary | ICD-10-CM | POA: Diagnosis not present

## 2022-01-04 DIAGNOSIS — I87321 Chronic venous hypertension (idiopathic) with inflammation of right lower extremity: Secondary | ICD-10-CM

## 2022-01-04 DIAGNOSIS — S81811A Laceration without foreign body, right lower leg, initial encounter: Secondary | ICD-10-CM | POA: Diagnosis not present

## 2022-01-04 DIAGNOSIS — E1140 Type 2 diabetes mellitus with diabetic neuropathy, unspecified: Secondary | ICD-10-CM | POA: Diagnosis not present

## 2022-01-04 DIAGNOSIS — Z9981 Dependence on supplemental oxygen: Secondary | ICD-10-CM | POA: Diagnosis not present

## 2022-01-04 DIAGNOSIS — J449 Chronic obstructive pulmonary disease, unspecified: Secondary | ICD-10-CM | POA: Diagnosis not present

## 2022-01-05 ENCOUNTER — Other Ambulatory Visit: Payer: Medicare Other

## 2022-01-05 ENCOUNTER — Ambulatory Visit: Payer: Medicare Other

## 2022-01-05 ENCOUNTER — Ambulatory Visit: Payer: Medicare Other | Admitting: Internal Medicine

## 2022-01-05 MED FILL — Iron Sucrose Inj 20 MG/ML (Fe Equiv): INTRAVENOUS | Qty: 10 | Status: AC

## 2022-01-06 ENCOUNTER — Inpatient Hospital Stay: Payer: Medicare Other | Attending: Internal Medicine

## 2022-01-06 VITALS — BP 156/51 | HR 72 | Temp 98.1°F | Resp 20

## 2022-01-06 DIAGNOSIS — Z79899 Other long term (current) drug therapy: Secondary | ICD-10-CM | POA: Diagnosis not present

## 2022-01-06 DIAGNOSIS — D509 Iron deficiency anemia, unspecified: Secondary | ICD-10-CM | POA: Diagnosis not present

## 2022-01-06 DIAGNOSIS — Z9981 Dependence on supplemental oxygen: Secondary | ICD-10-CM | POA: Insufficient documentation

## 2022-01-06 DIAGNOSIS — Z87891 Personal history of nicotine dependence: Secondary | ICD-10-CM | POA: Diagnosis not present

## 2022-01-06 DIAGNOSIS — D649 Anemia, unspecified: Secondary | ICD-10-CM

## 2022-01-06 DIAGNOSIS — Z85118 Personal history of other malignant neoplasm of bronchus and lung: Secondary | ICD-10-CM | POA: Insufficient documentation

## 2022-01-06 DIAGNOSIS — J449 Chronic obstructive pulmonary disease, unspecified: Secondary | ICD-10-CM | POA: Diagnosis not present

## 2022-01-06 MED ORDER — SODIUM CHLORIDE 0.9 % IV SOLN
200.0000 mg | Freq: Once | INTRAVENOUS | Status: AC
  Administered 2022-01-06: 200 mg via INTRAVENOUS
  Filled 2022-01-06: qty 200

## 2022-01-06 MED ORDER — METHYLPREDNISOLONE SODIUM SUCC 125 MG IJ SOLR
40.0000 mg | Freq: Once | INTRAMUSCULAR | Status: AC
  Administered 2022-01-06: 40 mg via INTRAVENOUS
  Filled 2022-01-06: qty 2

## 2022-01-06 MED ORDER — SODIUM CHLORIDE 0.9 % IV SOLN
Freq: Once | INTRAVENOUS | Status: AC
  Filled 2022-01-06: qty 250

## 2022-01-06 NOTE — Patient Instructions (Signed)

## 2022-01-07 ENCOUNTER — Encounter: Payer: Self-pay | Admitting: Internal Medicine

## 2022-01-08 NOTE — Progress Notes (Signed)
SHRAVYA, WICKWIRE ([id number]) [id number] Page 1 of 8 Visit Report for 01/04/2022 Chief Complaint Document Details Patient Name: Date of Service: Alexandria [name] 01/04/2022 2:30 PM Medical Record Number: [id number] Patient Account Number: [id number] Date of Birth/Sex: Treating RN: April 09, 1935 (86 y.o. F) Primary Care Provider: Leonides Cave Other Clinician: Referring Provider: Treating Provider/Extender: Valda Lamb Weeks in Treatment: 4 Information Obtained from: Patient Chief Complaint 12/03/2021; patient is here for review of a traumatic wound on the right posterior calf Electronic Signature(s) Signed: 01/08/2022 1:40:10 PM By: Kalman Shan DO Entered By: Kalman Shan on 01/04/2022 15:10:14 -------------------------------------------------------------------------------- HPI Details Patient Name: Date of Service: Alexandria [name], Alexandria [name] 01/04/2022 2:30 PM Medical Record Number: [id number] Patient Account Number: [id number] Date of Birth/Sex: Treating RN: 10-Apr-1935 (86 y.o. F) Primary Care Provider: Leonides Cave Other Clinician: Referring Provider: Treating Provider/Extender: Valda Lamb Weeks in Treatment: 4 History of Present Illness HPI Description: ADMISSION 12/03/2021 This is a 86 year old woman who arrives in clinic for review of a traumatic area on her right posterior calf that happened about 2 weeks ago. She says she did this on the foot rest of the wheelchair. She was seen in the ED on 11/23/2021 and was given a prescription for doxycycline. She normally wears compression stockings but stopped wearing them "because of the wound". She has had increasing edema in the right leg since then to the point where the wound is weeping edema. She has been using Xeroform that she had leftover. From previous wounds on her legs. I note that she was previously seen in [place] [facility] in 2022 at that point felt to have chronic venous insufficiency and lymphedema Past medical history is really quite extensive she has type 2 diabetes with neuropathy but very well controlled with a hemoglobin A1c of 5.8, COPD on chronic oxygen, hyperlipidemia, hypothyroidism, history of right upper lobe lung Alexandria, iron deficiency anemia, gastroesophageal reflux disease/esophagitis, she has been seen in our sister clinic in [place] in the past ABI in our clinic was 1.08. She had an echocardiogram done this year that was reasonably unremarkable. She also had a DVT rule out study that was negative in April of this year 10/5; patient presents for follow-up. She tolerated the compression wrap well. She has been using Iodosorb under 3 layer compression. 10/16; patient presents for follow-up. We have been using endoform under 3 layer compression. She has no issues or complaints today. 10/23; patient presents for follow-up. We have been using silver alginate with antibiotic ointment under 4-layer compression. Patient has no issues or complaints today. 10/30; patient presents for follow-up. We have been using silver alginate with antibiotic ointment under 4-layer compression. there has been improvement in wound healing. Electronic Signature(s) Signed: 01/08/2022 1:40:10 PM By: Gay Filler ([id number]) [id number] Page 2 of 8 Entered By: Kalman Shan on 01/04/2022 15:11:18 -------------------------------------------------------------------------------- Physical Exam Details Patient Name: Date of Service: Alexandria Bicker LYN E. 01/04/2022 2:30 PM Medical Record Number: [id number] Patient Account Number: [id number] Date of Birth/Sex: Treating RN: [birth year] (86 y.o. F) Primary Care Provider: Leonides Cave Other Clinician: Referring Provider: Treating Provider/Extender: Valda Lamb Weeks in Treatment:  4 Constitutional respirations regular, non-labored and within target range for patient.. Cardiovascular 2+ dorsalis pedis/posterior tibialis pulses. Psychiatric pleasant and cooperative. Notes : Right lower extremity: T the medial posterior calf there is a small open wound with granulation tissue at the opening.  Decent edema control. No signs of o surrounding infection. Electronic Signature(s) Signed: 01/08/2022 1:40:10 PM By: Kalman Shan DO Entered By: Kalman Shan on 01/04/2022 15:11:46 -------------------------------------------------------------------------------- Physician Orders Details Patient Name: Date of Service: Alexandria [name], Alexandria [name] 01/04/2022 2:30 PM Medical Record Number: [id number] Patient Account Number: [id number] Date of Birth/Sex: Treating RN: Jun 17, 1935 (86 y.o. Tonita Phoenix, Lauren Primary Care Provider: Leonides Cave Other Clinician: Referring Provider: Treating Provider/Extender: Valda Lamb Weeks in Treatment: 4 Verbal / Phone Orders: No Diagnosis Coding Follow-up Appointments ppointment in 1 week. - with Dr. Heber [place] on Thursday Return A Anesthetic (In clinic) Topical Lidocaine 5% applied to wound bed (In clinic) Topical Lidocaine 4% applied to wound bed Bathing/ Shower/ Hygiene May shower with protection but do not get wound dressing(s) wet. - Can shower with cast protector bag from [facility] or [employer] Edema Control - Lymphedema / SCD / Other Elevate legs to the level of the heart or above for 30 minutes daily and/or when sitting, a frequency of: - throughout the day Avoid standing for long periods of time. Additional Orders / Instructions Follow Nutritious Diet Wound Treatment Wound #1 - Lower Leg Wound Laterality: Right, Posterior Cleanser: Soap and Water 1 x Per Alexandria [name], Alexandria [name] ([id number]) [id number] Page 3 of 8 Discharge Instructions: May shower and wash wound with  dial antibacterial soap and water prior to dressing change. Cleanser: Wound Cleanser 1 x Per Week Discharge Instructions: Cleanse the wound with wound cleanser prior to applying a clean dressing using gauze sponges, not tissue or cotton balls. Peri-Wound Care: Triamcinolone 15 (g) 1 x Per Week Discharge Instructions: Use triamcinolone 15 (g) as directed Peri-Wound Care: Sween Lotion (Moisturizing lotion) 1 x Per Week Discharge Instructions: Apply moisturizing lotion as directed Topical: Gentamicin 1 x Per Week Discharge Instructions: As directed by physician Topical: Mupirocin Ointment 1 x Per Week Discharge Instructions: Apply Mupirocin (Bactroban) as instructed Prim Dressing: KerraCel Ag Gelling Fiber Dressing, 4x5 in (silver alginate) 1 x Per Week ary Discharge Instructions: Apply silver alginate to wound bed as instructed Secondary Dressing: ABD Pad, 8x10 1 x Per Week Discharge Instructions: Apply over primary dressing as directed. Compression Wrap: FourPress (4 layer compression wrap) 1 x Per Week Discharge Instructions: Apply four layer compression as directed. May also use Miliken CoFlex 2 layer compression system as alternative. Electronic Signature(s) Signed: 01/08/2022 1:40:10 PM By: Kalman Shan DO Entered By: Kalman Shan on 01/04/2022 15:11:53 -------------------------------------------------------------------------------- Problem List Details Patient Name: Date of Service: Alexandria [name], Alexandria [name] 01/04/2022 2:30 PM Medical Record Number: [id number] Patient Account Number: [id number] Date of Birth/Sex: Treating RN: 1935-06-02 (86 y.o. F) Primary Care Provider: Leonides Cave Other Clinician: Referring Provider: Treating Provider/Extender: Valda Lamb Weeks in Treatment: 4 Active Problems ICD-10 Encounter Code Description Active Date MDM Diagnosis S81.811D Laceration without foreign body, right lower leg, subsequent encounter  12/03/2021 No Yes L97.818 Non-pressure chronic ulcer of other part of right lower leg with other specified 12/03/2021 No Yes severity I87.321 Chronic venous hypertension (idiopathic) with inflammation of right lower 12/03/2021 No Yes extremity Inactive Problems Resolved Problems Electronic Signature(s) HAILEIGH, PITZ ([id number]) [id number] Page 4 of 8 Signed: 01/08/2022 1:40:10 PM By: Kalman Shan DO Entered By: Kalman Shan on 01/04/2022 15:09:58 -------------------------------------------------------------------------------- Progress Note Details Patient Name: Date of Service: Alexandria [name], Alexandria [name] 01/04/2022 2:30 PM Medical Record Number: [id number] Patient Account Number: [id number] Date of Birth/Sex: Treating RN: [birth year] (86 y.o. F) Primary Care  Provider: Leonides Cave Other Clinician: Referring Provider: Treating Provider/Extender: Valda Lamb Weeks in Treatment: 4 Subjective Chief Complaint Information obtained from Patient 12/03/2021; patient is here for review of a traumatic wound on the right posterior calf History of Present Illness (HPI) ADMISSION 12/03/2021 This is a 86 year old woman who arrives in clinic for review of a traumatic area on her right posterior calf that happened about 2 weeks ago. She says she did this on the foot rest of the wheelchair. She was seen in the ED on 11/23/2021 and was given a prescription for doxycycline. She normally wears compression stockings but stopped wearing them "because of the wound". She has had increasing edema in the right leg since then to the point where the wound is weeping edema. She has been using Xeroform that she had leftover. From previous wounds on her legs. I note that she was previously seen in [place] [facility] center in 2022 at that point felt to have chronic venous insufficiency and lymphedema Past medical history is really quite extensive she has  type 2 diabetes with neuropathy but very well controlled with a hemoglobin A1c of 5.8, COPD on chronic oxygen, hyperlipidemia, hypothyroidism, history of right upper lobe lung Alexandria, iron deficiency anemia, gastroesophageal reflux disease/esophagitis, she has been seen in our sister clinic in [place] in the past ABI in our clinic was 1.08. She had an echocardiogram done this year that was reasonably unremarkable. She also had a DVT rule out study that was negative in April of this year 10/5; patient presents for follow-up. She tolerated the compression wrap well. She has been using Iodosorb under 3 layer compression. 10/16; patient presents for follow-up. We have been using endoform under 3 layer compression. She has no issues or complaints today. 10/23; patient presents for follow-up. We have been using silver alginate with antibiotic ointment under 4-layer compression. Patient has no issues or complaints today. 10/30; patient presents for follow-up. We have been using silver alginate with antibiotic ointment under 4-layer compression. there has been improvement in wound healing. Patient History Information obtained from Patient. Family History Cancer - Mother, Heart Disease - Mother, Lung Disease - Father, Thyroid Problems - Siblings, No family history of Diabetes, Hereditary Spherocytosis, Hypertension, Kidney Disease, Seizures, Stroke, Tuberculosis. Social History Former smoker - 72 years - ended on 03/08/2005, Marital Status - Widowed, Alcohol Use - Never, Drug Use - No History, Caffeine Use - Daily. Medical History Eyes Patient has history of Cataracts Denies history of Glaucoma, Optic Neuritis Ear/Nose/Mouth/Throat Patient has history of Chronic sinus problems/congestion Hematologic/Lymphatic Patient has history of Anemia Denies history of Hemophilia, Human Immunodeficiency Virus, Lymphedema, Sickle Cell Disease Respiratory Patient has history of Chronic Obstructive Pulmonary Disease  (COPD) Denies history of Aspiration, Asthma, Pneumothorax, Sleep Apnea, Tuberculosis Cardiovascular Patient has history of Congestive Heart Failure, Coronary Artery Disease Denies history of Angina, Arrhythmia, Deep Vein Thrombosis, Hypertension, Hypotension, Myocardial Infarction, Peripheral Arterial Disease, Peripheral Venous Disease, Phlebitis, Vasculitis Gastrointestinal Denies history of Cirrhosis , Colitis, Crohnoos, Hepatitis A, Hepatitis B, Hepatitis C Endocrine Denies history of Type II Diabetes Genitourinary Denies history of End Stage Renal Disease Alexandria [name], Alexandria [name] ([id number]) [id number] Page 5 of 8 Immunological Denies history of Lupus Erythematosus, Raynaudoos, Scleroderma Integumentary (Skin) Denies history of History of Burn Musculoskeletal Patient has history of Rheumatoid Arthritis Denies history of Gout, Osteoarthritis, Osteomyelitis Neurologic Patient has history of Neuropathy Denies history of Dementia, Quadriplegia, Paraplegia, Seizure Disorder Oncologic Patient has history of Received Radiation Denies history of Received Chemotherapy  Psychiatric Denies history of Anorexia/bulimia, Confinement Anxiety Hospitalization/Surgery History - hystertomy. - both shoulders replaced. - C-section x 2. Medical A Surgical History Notes nd Cardiovascular Heart mummer Endocrine Prediabetic Psychiatric anxiety Objective Constitutional respirations regular, non-labored and within target range for patient.. Vitals Time Taken: 2:40 PM, Height: 65 in, Weight: 219.5 lbs, BMI: 36.5, Temperature: 98.9 F, Pulse: 87 bpm, Respiratory Rate: 20 breaths/min, Blood Pressure: 146/75 mmHg. Cardiovascular 2+ dorsalis pedis/posterior tibialis pulses. Psychiatric pleasant and cooperative. General Notes: : Right lower extremity: T the medial posterior calf there is a small open wound with granulation tissue at the opening. Decent edema control. o No  signs of surrounding infection. Integumentary (Hair, Skin) Wound #1 status is Open. Original cause of wound was Trauma. The date acquired was: 11/22/2021. The wound has been in treatment 4 weeks. The wound is located on the Right,Posterior Lower Leg. The wound measures 0.1cm length x 0.1cm width x 0.1cm depth; 0.008cm^2 area and 0.001cm^3 volume. There is no tunneling or undermining noted. There is a none present amount of drainage noted. The wound margin is distinct with the outline attached to the wound base. There is no granulation within the wound bed. There is no necrotic tissue within the wound bed. The periwound skin appearance had no abnormalities noted for texture. The periwound skin appearance had no abnormalities noted for moisture. The periwound skin appearance exhibited: Hemosiderin Staining. The periwound skin appearance did not exhibit: Atrophie Blanche, Cyanosis, Ecchymosis, Mottled, Pallor, Rubor, Erythema. Periwound temperature was noted as No Abnormality. Assessment Active Problems ICD-10 Laceration without foreign body, right lower leg, subsequent encounter Non-pressure chronic ulcer of other part of right lower leg with other specified severity Chronic venous hypertension (idiopathic) with inflammation of right lower extremity Patient's wound has shown improvement in size in appearance since last clinic visit. I recommended continuing the course with antibiotic ointment and silver alginate under compression therapy. Follow-up in 1 week Plan Follow-up Appointments: Alexandria [name], Alexandria [name] ([id number]) [id number] Page 6 of 8 Return Appointment in 1 week. - with Dr. Heber [place] on Thursday Anesthetic: (In clinic) Topical Lidocaine 5% applied to wound bed (In clinic) Topical Lidocaine 4% applied to wound bed Bathing/ Shower/ Hygiene: May shower with protection but do not get wound dressing(s) wet. - Can shower with cast protector bag from [facility], [facility] or  [employer] Edema Control - Lymphedema / SCD / Other: Elevate legs to the level of the heart or above for 30 minutes daily and/or when sitting, a frequency of: - throughout the day Avoid standing for long periods of time. Additional Orders / Instructions: Follow Nutritious Diet WOUND #1: - Lower Leg Wound Laterality: Right, Posterior Cleanser: Soap and Water 1 x Per Week/ Discharge Instructions: May shower and wash wound with dial antibacterial soap and water prior to dressing change. Cleanser: Wound Cleanser 1 x Per Week/ Discharge Instructions: Cleanse the wound with wound cleanser prior to applying a clean dressing using gauze sponges, not tissue or cotton balls. Peri-Wound Care: Triamcinolone 15 (g) 1 x Per Week/ Discharge Instructions: Use triamcinolone 15 (g) as directed Peri-Wound Care: Sween Lotion (Moisturizing lotion) 1 x Per Week/ Discharge Instructions: Apply moisturizing lotion as directed Topical: Gentamicin 1 x Per Week/ Discharge Instructions: As directed by physician Topical: Mupirocin Ointment 1 x Per Week/ Discharge Instructions: Apply Mupirocin (Bactroban) as instructed Prim Dressing: KerraCel Ag Gelling Fiber Dressing, 4x5 in (silver alginate) 1 x Per Week/ ary Discharge Instructions: Apply silver alginate to wound bed as instructed Secondary Dressing: ABD Pad, 8x10 1 x Per  Week/ Discharge Instructions: Apply over primary dressing as directed. Com pression Wrap: FourPress (4 layer compression wrap) 1 x Per Week/ Discharge Instructions: Apply four layer compression as directed. May also use Miliken CoFlex 2 layer compression system as alternative. 1. Antibiotic ointment with silver alginate under 4-layer compression 2. Follow-up in 1 week Electronic Signature(s) Signed: 01/08/2022 1:40:10 PM By: Kalman Shan DO Entered By: Kalman Shan on 01/04/2022 15:12:29 -------------------------------------------------------------------------------- HxROS Details Patient  Name: Date of Service: Alexandria [name], Alexandria [name] 01/04/2022 2:30 PM Medical Record Number: [id number] Patient Account Number: [id number] Date of Birth/Sex: Treating RN: 1936/02/28 (86 y.o. F) Primary Care Provider: Leonides Cave Other Clinician: Referring Provider: Treating Provider/Extender: Valda Lamb Weeks in Treatment: 4 Information Obtained From Patient Eyes Medical History: Positive for: Cataracts Negative for: Glaucoma; Optic Neuritis Ear/Nose/Mouth/Throat Medical History: Positive for: Chronic sinus problems/congestion Hematologic/Lymphatic Medical History: Positive for: Anemia Negative for: Hemophilia; Human Immunodeficiency Virus; Lymphedema; Sickle Cell Disease Respiratory Medical History: Positive for: Chronic Obstructive Pulmonary Disease (COPD) Alexandria [name], Alexandria [name] ([id number]) [id number] Page 7 of 8 Negative for: Aspiration; Asthma; Pneumothorax; Sleep Apnea; Tuberculosis Cardiovascular Medical History: Positive for: Congestive Heart Failure; Coronary Artery Disease Negative for: Angina; Arrhythmia; Deep Vein Thrombosis; Hypertension; Hypotension; Myocardial Infarction; Peripheral Arterial Disease; Peripheral Venous Disease; Phlebitis; Vasculitis Past Medical History Notes: Heart mummer Gastrointestinal Medical History: Negative for: Cirrhosis ; Colitis; Crohns; Hepatitis A; Hepatitis B; Hepatitis C Endocrine Medical History: Negative for: Type II Diabetes Past Medical History Notes: Prediabetic Genitourinary Medical History: Negative for: End Stage Renal Disease Immunological Medical History: Negative for: Lupus Erythematosus; Raynauds; Scleroderma Integumentary (Skin) Medical History: Negative for: History of Burn Musculoskeletal Medical History: Positive for: Rheumatoid Arthritis Negative for: Gout; Osteoarthritis; Osteomyelitis Neurologic Medical History: Positive for: Neuropathy Negative  for: Dementia; Quadriplegia; Paraplegia; Seizure Disorder Oncologic Medical History: Positive for: Received Radiation Negative for: Received Chemotherapy Psychiatric Medical History: Negative for: Anorexia/bulimia; Confinement Anxiety Past Medical History Notes: anxiety HBO Extended History Items Ear/Nose/Mouth/Throat: Eyes: Chronic sinus Cataracts problems/congestion Immunizations Pneumococcal Vaccine: Received Pneumococcal Vaccination: Yes Received Pneumococcal Vaccination On or After 60th Birthday: Yes Implantable Devices None Hospitalization / Surgery History Alexandria [name], Alexandria [name] ([id number]) [id number] Page 8 of 8 Type of Hospitalization/Surgery hystertomy both shoulders replaced C-section x 2 Family and Social History Cancer: Yes - Mother; Diabetes: No; Heart Disease: Yes - Mother; Hereditary Spherocytosis: No; Hypertension: No; Kidney Disease: No; Lung Disease: Yes - Father; Seizures: No; Stroke: No; Thyroid Problems: Yes - Siblings; Tuberculosis: No; Former smoker - 84 years - ended on 03/08/2005; Marital Status - Widowed; Alcohol Use: Never; Drug Use: No History; Caffeine Use: Daily; Financial Concerns: No; Food, Clothing or Shelter Needs: No; Support System Lacking: No; Transportation Concerns: No Electronic Signature(s) Signed: 01/08/2022 1:40:10 PM By: Kalman Shan DO Entered By: Kalman Shan on 01/04/2022 15:11:24 -------------------------------------------------------------------------------- SuperBill Details Patient Name: Date of Service: Alexandria [name], Alexandria [name] 01/04/2022 Medical Record Number: [id number] Patient Account Number: [id number] Date of Birth/Sex: Treating RN: 18-Dec-1935 (86 y.o. F) Primary Care Provider: Leonides Cave Other Clinician: Referring Provider: Treating Provider/Extender: Valda Lamb Weeks in Treatment: 4 Diagnosis Coding ICD-10 Codes Code Description [phone] Laceration  without foreign body, right lower leg, subsequent encounter L97.818 Non-pressure chronic ulcer of other part of right lower leg with other specified severity I87.321 Chronic venous hypertension (idiopathic) with inflammation of right lower extremity Facility Procedures : CPT4 Code: [id number] Description: (Facility Use Only) 71062IR - APPLY MULTLAY COMPRS LWR RT LEG Modifier: Quantity: 1 Physician Procedures :  CPT4 Code Description Modifier [id number] - WC PHYS LEVEL 3 - EST PT ICD-10 Diagnosis Description S81.811D Laceration without foreign body, right lower leg, subsequent encounter L97.818 Non-pressure chronic ulcer of other part of right lower leg  with other specified severity I87.321 Chronic venous hypertension (idiopathic) with inflammation of right lower extremity Quantity: 1 Electronic Signature(s) Signed: 01/05/2022 10:10:09 AM By: Deon Pilling RN, BSN Signed: 01/08/2022 1:40:10 PM By: Kalman Shan DO Entered By: Deon Pilling on 01/05/2022 10:10:09

## 2022-01-12 MED FILL — Iron Sucrose Inj 20 MG/ML (Fe Equiv): INTRAVENOUS | Qty: 10 | Status: AC

## 2022-01-13 ENCOUNTER — Inpatient Hospital Stay: Payer: Medicare Other

## 2022-01-13 VITALS — BP 137/57 | HR 67 | Temp 97.7°F | Resp 20

## 2022-01-13 DIAGNOSIS — Z85118 Personal history of other malignant neoplasm of bronchus and lung: Secondary | ICD-10-CM | POA: Diagnosis not present

## 2022-01-13 DIAGNOSIS — J449 Chronic obstructive pulmonary disease, unspecified: Secondary | ICD-10-CM | POA: Diagnosis not present

## 2022-01-13 DIAGNOSIS — Z87891 Personal history of nicotine dependence: Secondary | ICD-10-CM | POA: Diagnosis not present

## 2022-01-13 DIAGNOSIS — Z9981 Dependence on supplemental oxygen: Secondary | ICD-10-CM | POA: Diagnosis not present

## 2022-01-13 DIAGNOSIS — D649 Anemia, unspecified: Secondary | ICD-10-CM

## 2022-01-13 DIAGNOSIS — Z79899 Other long term (current) drug therapy: Secondary | ICD-10-CM | POA: Diagnosis not present

## 2022-01-13 DIAGNOSIS — D509 Iron deficiency anemia, unspecified: Secondary | ICD-10-CM | POA: Diagnosis not present

## 2022-01-13 MED ORDER — METHYLPREDNISOLONE SODIUM SUCC 125 MG IJ SOLR
40.0000 mg | Freq: Once | INTRAMUSCULAR | Status: AC
  Administered 2022-01-13: 40 mg via INTRAVENOUS
  Filled 2022-01-13: qty 2

## 2022-01-13 MED ORDER — SODIUM CHLORIDE 0.9 % IV SOLN
200.0000 mg | Freq: Once | INTRAVENOUS | Status: AC
  Administered 2022-01-13: 200 mg via INTRAVENOUS
  Filled 2022-01-13: qty 200

## 2022-01-13 MED ORDER — SODIUM CHLORIDE 0.9 % IV SOLN
Freq: Once | INTRAVENOUS | Status: AC
  Filled 2022-01-13: qty 250

## 2022-01-14 ENCOUNTER — Encounter (HOSPITAL_BASED_OUTPATIENT_CLINIC_OR_DEPARTMENT_OTHER): Payer: Medicare Other | Attending: Internal Medicine | Admitting: Internal Medicine

## 2022-01-14 DIAGNOSIS — I87321 Chronic venous hypertension (idiopathic) with inflammation of right lower extremity: Secondary | ICD-10-CM | POA: Insufficient documentation

## 2022-01-14 DIAGNOSIS — E039 Hypothyroidism, unspecified: Secondary | ICD-10-CM | POA: Insufficient documentation

## 2022-01-14 DIAGNOSIS — K2100 Gastro-esophageal reflux disease with esophagitis, without bleeding: Secondary | ICD-10-CM | POA: Insufficient documentation

## 2022-01-14 DIAGNOSIS — S81811D Laceration without foreign body, right lower leg, subsequent encounter: Secondary | ICD-10-CM | POA: Insufficient documentation

## 2022-01-14 DIAGNOSIS — Z85118 Personal history of other malignant neoplasm of bronchus and lung: Secondary | ICD-10-CM | POA: Insufficient documentation

## 2022-01-14 DIAGNOSIS — E1140 Type 2 diabetes mellitus with diabetic neuropathy, unspecified: Secondary | ICD-10-CM | POA: Insufficient documentation

## 2022-01-14 DIAGNOSIS — Z9981 Dependence on supplemental oxygen: Secondary | ICD-10-CM | POA: Insufficient documentation

## 2022-01-14 DIAGNOSIS — L97818 Non-pressure chronic ulcer of other part of right lower leg with other specified severity: Secondary | ICD-10-CM | POA: Insufficient documentation

## 2022-01-14 DIAGNOSIS — J449 Chronic obstructive pulmonary disease, unspecified: Secondary | ICD-10-CM | POA: Diagnosis not present

## 2022-01-14 DIAGNOSIS — E785 Hyperlipidemia, unspecified: Secondary | ICD-10-CM | POA: Insufficient documentation

## 2022-01-14 DIAGNOSIS — X58XXXD Exposure to other specified factors, subsequent encounter: Secondary | ICD-10-CM | POA: Diagnosis not present

## 2022-01-14 DIAGNOSIS — D509 Iron deficiency anemia, unspecified: Secondary | ICD-10-CM | POA: Insufficient documentation

## 2022-01-15 ENCOUNTER — Encounter (HOSPITAL_BASED_OUTPATIENT_CLINIC_OR_DEPARTMENT_OTHER): Payer: Medicare Other | Admitting: Internal Medicine

## 2022-01-15 NOTE — Progress Notes (Signed)
KENZLIE, DISCH ([id number]) [id number] Page 1 of 8 Visit Report for 01/14/2022 Chief Complaint Document Details Patient Name: Date of Service: Alexandria [name], [place] [name] 01/14/2022 2:45 PM Medical Record Number: [id number] Patient Account Number: [id number] Date of Birth/Sex: Treating RN: [birth year] (86 y.o. F) Primary Care Provider: Leonides Cave Other Clinician: Referring Provider: Treating Provider/Extender: Valda Lamb Weeks in Treatment: 6 Information Obtained from: Patient Chief Complaint 12/03/2021; patient is here for review of a traumatic wound on the right posterior calf Electronic Signature(s) Signed: 01/15/2022 1:24:23 PM By: Kalman Shan DO Entered By: Kalman Shan on 01/15/2022 13:21:51 -------------------------------------------------------------------------------- HPI Details Patient Name: Date of Service: Alexandria [name], [place] [name] 01/14/2022 2:45 PM Medical Record Number: [id number] Patient Account Number: [id number] Date of Birth/Sex: Treating RN: 1935-08-02 (86 y.o. F) Primary Care Provider: Leonides Cave Other Clinician: Referring Provider: Treating Provider/Extender: Valda Lamb Weeks in Treatment: 6 History of Present Illness HPI Description: ADMISSION 12/03/2021 This is a 86 year old woman who arrives in clinic for review of a traumatic area on her right posterior calf that happened about 2 weeks ago. She says she did this on the foot rest of the wheelchair. She was seen in the ED on 11/23/2021 and was given a prescription for doxycycline. She normally wears compression stockings but stopped wearing them "because of the wound". She has had increasing edema in the right leg since then to the point where the wound is weeping edema. She has been using Xeroform that she had leftover. From previous wounds on her legs. I note that she was previously seen in [place] wound care center  in 2022 at that point felt to have chronic venous insufficiency and lymphedema Past medical history is really quite extensive she has type 2 diabetes with neuropathy but very well controlled with a hemoglobin A1c of 5.8, COPD on chronic oxygen, hyperlipidemia, hypothyroidism, history of right upper lobe lung [place], iron deficiency anemia, gastroesophageal reflux disease/esophagitis, she has been seen in our sister clinic in [place] in the past ABI in our clinic was 1.08. She had an echocardiogram done this year that was reasonably unremarkable. She also had a DVT rule out study that was negative in April of this year 10/5; patient presents for follow-up. She tolerated the compression wrap well. She has been using Iodosorb under 3 layer compression. 10/16; patient presents for follow-up. We have been using endoform under 3 layer compression. She has no issues or complaints today. 10/23; patient presents for follow-up. We have been using silver alginate with antibiotic ointment under 4-layer compression. Patient has no issues or complaints today. 10/30; patient presents for follow-up. We have been using silver alginate with antibiotic ointment under 4-layer compression. there has been improvement in wound healing. 11/10; patient presents for follow-up. We have been using antibiotic ointment with silver alginate under 4-layer compression. Her wound is healed. Electronic Signature(s) ARNETT, GALINDEZ ([id number]) [id number] Page 2 of 8 Signed: 01/15/2022 1:24:23 PM By: Kalman Shan DO Entered By: Kalman Shan on 01/15/2022 13:22:15 -------------------------------------------------------------------------------- Physical Exam Details Patient Name: Date of Service: Alexandria [name], [place] [name] 01/14/2022 2:45 PM Medical Record Number: [id number] Patient Account Number: [id number] Date of Birth/Sex: Treating RN: 1935-12-25 (86 y.o. F) Primary Care Provider: Leonides Cave Other Clinician: Referring Provider: Treating Provider/Extender: Valda Lamb Weeks in Treatment: 6 Constitutional respirations regular, non-labored and within target range for patient.. Cardiovascular 2+ dorsalis pedis/posterior tibialis pulses. Psychiatric pleasant and cooperative. Notes  Right lower extremity: T the medial posterior calf there is epithelization to the previous wound site. Venous stasis dermatitis. Good edema control. o Electronic Signature(s) Signed: 01/15/2022 1:24:23 PM By: Kalman Shan DO Entered By: Kalman Shan on 01/15/2022 13:22:51 -------------------------------------------------------------------------------- Physician Orders Details Patient Name: Date of Service: Alexandria [name], [place] [name] 01/14/2022 2:45 PM Medical Record Number: [id number] Patient Account Number: [id number] Date of Birth/Sex: Treating RN: [birth year] (86 y.o. Tonita Phoenix, Alexandria [name] Primary Care Provider: Leonides Cave Other Clinician: Referring Provider: Treating Provider/Extender: Valda Lamb Weeks in Treatment: 6 Verbal / Phone Orders: No Diagnosis Coding Discharge From [facility] Services Discharge from [place] Edema Control - Lymphedema / SCD / Other Elevate legs to the level of the heart or above for 30 minutes daily and/or when sitting, a frequency of: Avoid standing for long periods of time. Patient to wear own compression stockings every day. Electronic Signature(s) Signed: 01/15/2022 1:24:23 PM By: Kalman Shan DO Previous Signature: 01/14/2022 4:44:16 PM Version By: Kalman Shan DO Entered By: Kalman Shan on 01/15/2022 13:23:14 [name] ([id number]) [id number] Page 3 of 8 -------------------------------------------------------------------------------- Problem List Details Patient Name: Date of Service: Alexandria [name], [place] [name] 01/14/2022 2:45 PM Medical Record  Number: [id number] Patient Account Number: [id number] Date of Birth/Sex: Treating RN: 03-06-1936 (86 y.o. F) Primary Care Provider: Leonides Cave Other Clinician: Referring Provider: Treating Provider/Extender: Valda Lamb Weeks in Treatment: 6 Active Problems ICD-10 Encounter Code Description Active Date MDM Diagnosis S81.811D Laceration without foreign body, right lower leg, subsequent encounter 12/03/2021 No Yes L97.818 Non-pressure chronic ulcer of other part of right lower leg with other specified 12/03/2021 No Yes severity I87.321 Chronic venous hypertension (idiopathic) with inflammation of right lower 12/03/2021 No Yes extremity Inactive Problems Resolved Problems Electronic Signature(s) Signed: 01/15/2022 1:24:23 PM By: Kalman Shan DO Entered By: Kalman Shan on 01/15/2022 13:21:41 -------------------------------------------------------------------------------- Progress Note Details Patient Name: Date of Service: Alexandria [name], [place] [name] 01/14/2022 2:45 PM Medical Record Number: [id number] Patient Account Number: [id number] Date of Birth/Sex: Treating RN: 1935/12/11 (86 y.o. F) Primary Care Provider: Leonides Cave Other Clinician: Referring Provider: Treating Provider/Extender: Valda Lamb Weeks in Treatment: 6 Subjective Chief Complaint Information obtained from Patient 12/03/2021; patient is here for review of a traumatic wound on the right posterior calf History of Present Illness (HPI) ADMISSION 12/03/2021 This is a 86 year old woman who arrives in clinic for review of a traumatic area on her right posterior calf that happened about 2 weeks ago. She says she did this on the foot rest of the wheelchair. She was seen in the ED on 11/23/2021 and was given a prescription for doxycycline. She normally wears compression stockings but stopped wearing them "because of the wound". She has had increasing edema in  the right leg since then to the point where the wound is weeping edema. She has been using Xeroform that she had leftover. From previous wounds on her legs. I note that she was previously seen in [place] wound care center in 2022 at that point felt to have chronic venous insufficiency and lymphedema MORELIA, CASSELLS ([id number]) [id number] Page 4 of 8 Past medical history is really quite extensive she has type 2 diabetes with neuropathy but very well controlled with a hemoglobin A1c of 5.8, COPD on chronic oxygen, hyperlipidemia, hypothyroidism, history of right upper lobe lung [place], iron deficiency anemia, gastroesophageal reflux disease/esophagitis, she has been seen in our sister clinic in [place]  in the past ABI in our clinic was 1.08. She had an echocardiogram done this year that was reasonably unremarkable. She also had a DVT rule out study that was negative in April of this year 10/5; patient presents for follow-up. She tolerated the compression wrap well. She has been using Iodosorb under 3 layer compression. 10/16; patient presents for follow-up. We have been using endoform under 3 layer compression. She has no issues or complaints today. 10/23; patient presents for follow-up. We have been using silver alginate with antibiotic ointment under 4-layer compression. Patient has no issues or complaints today. 10/30; patient presents for follow-up. We have been using silver alginate with antibiotic ointment under 4-layer compression. there has been improvement in wound healing. 11/10; patient presents for follow-up. We have been using antibiotic ointment with silver alginate under 4-layer compression. Her wound is healed. Patient History Information obtained from Patient. Family History Cancer - Mother, Heart Disease - Mother, Lung Disease - Father, Thyroid Problems - Siblings, No family history of Diabetes, Hereditary Spherocytosis, Hypertension, Kidney Disease,  Seizures, Stroke, Tuberculosis. Social History Former smoker - 30 years - ended on 03/08/2005, Marital Status - Widowed, Alcohol Use - Never, Drug Use - No History, Caffeine Use - Daily. Medical History Eyes Patient has history of Cataracts Denies history of Glaucoma, Optic Neuritis Ear/Nose/Mouth/Throat Patient has history of Chronic sinus problems/congestion Hematologic/Lymphatic Patient has history of Anemia Denies history of Hemophilia, Human Immunodeficiency Virus, Lymphedema, Sickle Cell Disease Respiratory Patient has history of Chronic Obstructive Pulmonary Disease (COPD) Denies history of Aspiration, Asthma, Pneumothorax, Sleep Apnea, Tuberculosis Cardiovascular Patient has history of Congestive Heart Failure, Coronary Artery Disease Denies history of Angina, Arrhythmia, Deep Vein Thrombosis, Hypertension, Hypotension, Myocardial Infarction, Peripheral Arterial Disease, Peripheral Venous Disease, Phlebitis, Vasculitis Gastrointestinal Denies history of Cirrhosis , Colitis, Crohnoos, Hepatitis A, Hepatitis B, Hepatitis C Endocrine Denies history of Type II Diabetes Genitourinary Denies history of End Stage Renal Disease Immunological Denies history of Lupus Erythematosus, Raynaudoos, Scleroderma Integumentary (Skin) Denies history of History of Burn Musculoskeletal Patient has history of Rheumatoid Arthritis Denies history of Gout, Osteoarthritis, Osteomyelitis Neurologic Patient has history of Neuropathy Denies history of Dementia, Quadriplegia, Paraplegia, Seizure Disorder Oncologic Patient has history of Received Radiation Denies history of Received Chemotherapy Psychiatric Denies history of Anorexia/bulimia, Confinement Anxiety Hospitalization/Surgery History - hystertomy. - both shoulders replaced. - C-section x 2. Medical A Surgical History Notes nd Cardiovascular Heart mummer Endocrine Prediabetic Psychiatric anxiety Objective NAILA, ELIZONDO  ([id number]) [id number] Page 5 of 8 Constitutional respirations regular, non-labored and within target range for patient.. Vitals Time Taken: 2:52 PM, Height: 65 in, Weight: 219.5 lbs, BMI: 36.5, Temperature: 97.9 F, Pulse: 83 bpm, Respiratory Rate: 18 breaths/min, Blood Pressure: 143/80 mmHg. Cardiovascular 2+ dorsalis pedis/posterior tibialis pulses. Psychiatric pleasant and cooperative. General Notes: Right lower extremity: T the medial posterior calf there is epithelization to the previous wound site. Venous stasis dermatitis. Good edema o control. Integumentary (Hair, Skin) Wound #1 status is Open. Original cause of wound was Trauma. The date acquired was: 11/22/2021. The wound has been in treatment 6 weeks. The wound is located on the Right,Posterior Lower Leg. The wound measures 0cm length x 0cm width x 0cm depth; 0cm^2 area and 0cm^3 volume. There is no tunneling or undermining noted. There is a none present amount of drainage noted. The wound margin is distinct with the outline attached to the wound base. There is no granulation within the wound bed. There is no necrotic tissue within the wound bed. The periwound skin  appearance had no abnormalities noted for texture. The periwound skin appearance had no abnormalities noted for moisture. The periwound skin appearance exhibited: Hemosiderin Staining. The periwound skin appearance did not exhibit: Atrophie Blanche, Cyanosis, Ecchymosis, Mottled, Pallor, Rubor, Erythema. Periwound temperature was noted as No Abnormality. Assessment Active Problems ICD-10 Laceration without foreign body, right lower leg, subsequent encounter Non-pressure chronic ulcer of other part of right lower leg with other specified severity Chronic venous hypertension (idiopathic) with inflammation of right lower extremity Patient has done well with silver alginate and antibiotic ointment under compression therapy. Her wound is healed.  I recommended she wear compression stockings daily. Follow-up as needed. Plan Discharge From [facility] Services: Discharge from [place] Edema Control - Lymphedema / SCD / Other: Elevate legs to the level of the heart or above for 30 minutes daily and/or when sitting, a frequency of: Avoid standing for long periods of time. Patient to wear own compression stockings every day. 1. Discharge from clinic due to closed wound 2. Follow-up as needed 3. Wear compression stockings daily Electronic Signature(s) Signed: 01/15/2022 1:24:23 PM By: Kalman Shan DO Entered By: Kalman Shan on 01/15/2022 13:23:50 -------------------------------------------------------------------------------- HxROS Details Patient Name: Date of Service: [place] [name], [place] [name] 01/14/2022 2:45 PM Medical Record Number: [id number] Patient Account Number: [id number] Date of Birth/Sex: Treating RN: [birth year] (86 y.o. F) Primary Care Provider: Leonides Cave Other Clinician: Referring Provider: Treating Provider/Extender: Rudolpho Sevin in Treatment: 6 JESSEY, HUYETT ([id number]) [id number] Page 6 of 8 Information Obtained From Patient Eyes Medical History: Positive for: Cataracts Negative for: Glaucoma; Optic Neuritis Ear/Nose/Mouth/Throat Medical History: Positive for: Chronic sinus problems/congestion Hematologic/Lymphatic Medical History: Positive for: Anemia Negative for: Hemophilia; Human Immunodeficiency Virus; Lymphedema; Sickle Cell Disease Respiratory Medical History: Positive for: Chronic Obstructive Pulmonary Disease (COPD) Negative for: Aspiration; Asthma; Pneumothorax; Sleep Apnea; Tuberculosis Cardiovascular Medical History: Positive for: Congestive Heart Failure; Coronary Artery Disease Negative for: Angina; Arrhythmia; Deep Vein Thrombosis; Hypertension; Hypotension; Myocardial Infarction; Peripheral Arterial Disease;  Peripheral Venous Disease; Phlebitis; Vasculitis Past Medical History Notes: Heart mummer Gastrointestinal Medical History: Negative for: Cirrhosis ; Colitis; Crohns; Hepatitis A; Hepatitis B; Hepatitis C Endocrine Medical History: Negative for: Type II Diabetes Past Medical History Notes: Prediabetic Genitourinary Medical History: Negative for: End Stage Renal Disease Immunological Medical History: Negative for: Lupus Erythematosus; Raynauds; Scleroderma Integumentary (Skin) Medical History: Negative for: History of Burn Musculoskeletal Medical History: Positive for: Rheumatoid Arthritis Negative for: Gout; Osteoarthritis; Osteomyelitis Neurologic Medical History: Positive for: Neuropathy Negative for: Dementia; Quadriplegia; Paraplegia; Seizure Disorder Oncologic Medical History: Positive for: Received Radiation Negative for: Received Chemotherapy EVANELL, REDLICH ([id number]) [id number] Page 7 of 8 Psychiatric Medical History: Negative for: Anorexia/bulimia; Confinement Anxiety Past Medical History Notes: anxiety HBO Extended History Items Ear/Nose/Mouth/Throat: Eyes: Chronic sinus Cataracts problems/congestion Immunizations Pneumococcal Vaccine: Received Pneumococcal Vaccination: Yes Received Pneumococcal Vaccination On or After 60th Birthday: Yes Implantable Devices None Hospitalization / Surgery History Type of Hospitalization/Surgery hystertomy both shoulders replaced C-section x 2 Family and Social History Cancer: Yes - Mother; Diabetes: No; Heart Disease: Yes - Mother; Hereditary Spherocytosis: No; Hypertension: No; Kidney Disease: No; Lung Disease: Yes - Father; Seizures: No; Stroke: No; Thyroid Problems: Yes - Siblings; Tuberculosis: No; Former smoker - 53 years - ended on 03/08/2005; Marital Status - Widowed; Alcohol Use: Never; Drug Use: No History; Caffeine Use: Daily; Financial Concerns: No; Food, Clothing or Shelter  Needs: No; Support System Lacking: No; Transportation Concerns: No Electronic Signature(s) Signed: 01/15/2022 1:24:23 PM By: Kalman Shan  DO Entered By: Kalman Shan on 01/15/2022 13:22:20 -------------------------------------------------------------------------------- SuperBill Details Patient Name: Date of Service: [place] [name], [place] [name] 01/14/2022 Medical Record Number: [id number] Patient Account Number: [id number] Date of Birth/Sex: Treating RN: 11-29-1935 (86 y.o. Tonita Phoenix, Alexandria [name] Primary Care Provider: Leonides Cave Other Clinician: Referring Provider: Treating Provider/Extender: Valda Lamb Weeks in Treatment: 6 Diagnosis Coding ICD-10 Codes Code Description [phone] Laceration without foreign body, right lower leg, subsequent encounter L97.818 Non-pressure chronic ulcer of other part of right lower leg with other specified severity I87.321 Chronic venous hypertension (idiopathic) with inflammation of right lower extremity Facility Procedures : CPT4 Code: [id number] Description: 99213 - WOUND CARE VISIT-LEV 3 EST PT Modifier: Quantity: 1 Physician Procedures : CPT4 Code Description Modifier [id number] - WC PHYS LEVEL 3 - EST PT ICD-10 Diagnosis Description S81.811D Laceration without foreign body, right lower leg, subsequent encounter L97.818 Non-pressure chronic ulcer of other part of right lower leg  with other specified severity CELISE, BAZAR ([id number]) [id number] I87.321 Chronic venous hypertension (idiopathic) with inflammation of right lower extremity Quantity: 1 df Page 8 of 8 Electronic Signature(s) Signed: 01/15/2022 1:24:23 PM By: Kalman Shan DO Previous Signature: 01/14/2022 4:44:16 PM Version By: Kalman Shan DO Entered By: Kalman Shan on 01/15/2022 13:24:03

## 2022-01-18 NOTE — Progress Notes (Signed)
Alexandria [name] ([id number]) [id number] Page 1 of 8 Visit Report for 01/14/2022 Arrival Information Details Patient Name: Date of Service: Alexandria [name], Alexandria Alexandria LYN E. 01/14/2022 2:45 PM Medical Record Number: [id number] Patient Account Number: [id number] Date of Birth/Sex: Treating RN: Alexandria 28, 1937 (86 y.o. Alexandria [name], Lovena Le Primary [name] Makaia Rappa: Leonides Cave Other Clinician: Referring Jaquavious Mercer: Treating Lezli Danek/Extender: Valda Lamb Weeks in Treatment: 6 Visit Information History Since Last Visit Added or deleted any medications: No Patient Arrived: Alexandria [name] Any new allergies or adverse reactions: No Arrival Time: 14:52 Had a fall or experienced change in No Accompanied By: self activities of daily living that Alexandria affect Transfer Assistance: None risk of falls: Patient Identification Verified: Yes Signs or symptoms of abuse/neglect since last visito No Secondary Verification Process Completed: Yes Hospitalized since last visit: No Patient Requires Transmission-Based Precautions: No Implantable device outside of the clinic excluding No Patient Has Alerts: No cellular tissue based products placed in the center since last visit: Has Dressing in Place as Prescribed: Yes Has Compression in Place as Prescribed: Yes Pain Present Now: No Electronic Signature(s) Signed: 01/14/2022 3:49:20 PM By: Adline Peals Entered By: Adline Peals on 01/14/2022 14:52:46 -------------------------------------------------------------------------------- Clinic Level of [name] Assessment Details Patient Name: Date of Service: Alexandria [name], Alexandria Alexandria LYN E. 01/14/2022 2:45 PM Medical Record Number: [id number] Patient Account Number: [id number] Date of Birth/Sex: Treating RN: Alexandria (86 y.o. Alexandria [name] Everest Brod: Leonides Cave Other Clinician: Referring Ellason Segar: Treating Bren Borys/Extender: Valda Lamb Weeks in Treatment: 6 Clinic Level of [name] Assessment Items TOOL 4 Quantity Score X- 1 0 Use when only an EandM is performed on FOLLOW-UP visit ASSESSMENTS - Nursing Assessment / Reassessment X- 1 10 Reassessment of Co-morbidities (includes updates in patient status) X- 1 5 Reassessment of Adherence to Treatment Plan ASSESSMENTS - Wound and Skin A ssessment / Reassessment X - Simple Wound Assessment / Reassessment - one wound 1 5 []  - 0 Complex Wound Assessment / Reassessment - multiple wounds []  - 0 Dermatologic / Skin Assessment (not related to wound area) ASSESSMENTS - Focused Assessment X- 1 5 Circumferential Edema Measurements - multi extremities []  - 0 Nutritional Assessment / Counseling / Intervention Alexandria [name], Alexandria [name] ([id number]) [id number] Page 2 of 8 []  - 0 Lower Extremity Assessment (monofilament, tuning fork, pulses) []  - 0 Peripheral Arterial Disease Assessment (using hand held doppler) ASSESSMENTS - Ostomy and/or Continence Assessment and [name] []  - 0 Incontinence Assessment and Management []  - 0 Ostomy [name] Assessment and Management (repouching, etc.) PROCESS - Coordination of [name] X - Simple Patient / Family Education for ongoing [name] 1 15 []  - 0 Complex (extensive) Patient / Family Education for ongoing [name] X- 1 10 Staff obtains Programmer, systems, Records, T Results / Process Orders est []  - 0 Staff telephones HHA, Nursing Homes / Clarify orders / etc []  - 0 Routine Transfer to another Facility (non-emergent condition) []  - 0 Routine Hospital Admission (non-emergent condition) []  - 0 New Admissions / Biomedical engineer / Ordering NPWT Apligraf, etc. , []  - 0 Emergency Hospital Admission (emergent condition) X- 1 10 Simple Discharge Coordination []  - 0 Complex (extensive) Discharge Coordination PROCESS - Special Needs []  - 0 Pediatric / Minor Patient Management []  - 0 Isolation Patient Management []  - 0 Hearing /  Language / Visual special needs []  - 0 Assessment of Community assistance (transportation, D/C planning, etc.) []  - 0 Additional assistance / Altered mentation []  - 0 Support Surface(s) Assessment (bed, cushion,  seat, etc.) INTERVENTIONS - Wound Cleansing / Measurement X - Simple Wound Cleansing - one wound 1 5 []  - 0 Complex Wound Cleansing - multiple wounds X- 1 5 Wound Imaging (photographs - any number of wounds) []  - 0 Wound Tracing (instead of photographs) X- 1 5 Simple Wound Measurement - one wound []  - 0 Complex Wound Measurement - multiple wounds INTERVENTIONS - Wound Dressings X - Small Wound Dressing one or multiple wounds 1 10 []  - 0 Medium Wound Dressing one or multiple wounds []  - 0 Large Wound Dressing one or multiple wounds X- 1 5 Application of Medications - topical []  - 0 Application of Medications - injection INTERVENTIONS - Miscellaneous []  - 0 External ear exam []  - 0 Specimen Collection (cultures, biopsies, blood, body fluids, etc.) []  - 0 Specimen(s) / Culture(s) sent or taken to Lab for analysis []  - 0 Patient Transfer (multiple staff / Civil Service fast streamer / Similar devices) []  - 0 Simple Staple / Suture removal (25 or less) []  - 0 Complex Staple / Suture removal (26 or more) []  - 0 Hypo / Hyperglycemic Management (close monitor of Blood Glucose) BIANNCA, SCANTLIN ([id number]) [id number] Page 3 of 8 []  - 0 Ankle / Brachial Index (ABI) - do not check if billed separately X- 1 5 Vital Signs Has the patient been seen at the hospital within the last three years: Yes Total Score: 95 Level Of [name]: New/Established - Level 3 Electronic Signature(s) Signed: 01/18/2022 4:10:29 PM By: Rhae Hammock RN Entered By: Rhae Hammock on 01/14/2022 15:31:47 -------------------------------------------------------------------------------- Encounter Discharge Information Details Patient Name: Date of Service: Alexandria [name], Alexandria Alexandria LYN E.  01/14/2022 2:45 PM Medical Record Number: [id number] Patient Account Number: [id number] Date of Birth/Sex: Treating RN: [birth year] (86 y.o. Alexandria [name] Abdulkareem Badolato: Leonides Cave Other Clinician: Referring Farris Blash: Treating Kieryn Burtis/Extender: Valda Lamb Weeks in Treatment: 6 Encounter Discharge Information Items Discharge Condition: Stable Ambulatory Status: Ambulatory Discharge Destination: Home Transportation: Private Auto Accompanied By: self Schedule Follow-up Appointment: Yes Clinical Summary of [name]: Patient Declined Electronic Signature(s) Signed: 01/18/2022 4:10:29 PM By: Rhae Hammock RN Entered By: Rhae Hammock on 01/14/2022 15:32:26 -------------------------------------------------------------------------------- Lower Extremity Assessment Details Patient Name: Date of Service: Alexandria [name], Alexandria Alexandria LYN E. 01/14/2022 2:45 PM Medical Record Number: [id number] Patient Account Number: [id number] Date of Birth/Sex: Treating RN: [birth year] (86 y.o. Harlow Ohms Primary [name] Jerriah Ines: Leonides Cave Other Clinician: Referring Zael Shuman: Treating Vannessa Godown/Extender: Valda Lamb Weeks in Treatment: 6 Edema Assessment Assessed: [Left: No] [Right: No] Edema: [Left: Ye] [Right: s] Calf Left: Right: Point of Measurement: 29 cm From Medial Instep 34.2 cm Ankle Left: Right: Point of Measurement: 9 cm From Medial Instep 21.8 cm Vascular Assessment Left: [id number] Page 4 of 8Right:] Pulses: Dorsalis Pedis Palpable: [id number] Page 4 of 8Yes] Electronic Signature(s) Signed: 01/14/2022 3:49:20 PM By: Adline Peals Entered By: Adline Peals on 01/14/2022 14:58:39 -------------------------------------------------------------------------------- Multi Wound Chart Details Patient Name: Date of Service: Alexandria [name], Alexandria Alexandria LYN E.  01/14/2022 2:45 PM Medical Record Number: [id number] Patient Account Number: [id number] Date of Birth/Sex: Treating RN: 10-23-1935 (86 y.o. F) Primary [name] Cindy Brindisi: Leonides Cave Other Clinician: Referring Greydis Stlouis: Treating Rital Cavey/Extender: Valda Lamb Weeks in Treatment: 6 Vital Signs Height(in): 65 Pulse(bpm): 25 Weight(lbs): 219.5 Blood Pressure(mmHg): 143/80 Body Mass Index(BMI): 36.5 Temperature(F): 97.9 Respiratory Rate(breaths/min): 18 [1:Photos:] [N/A:N/A] Right, Posterior Lower Leg N/A N/A Wound Location: Trauma N/A N/A Wounding Event: Venous Leg Ulcer N/A N/A Primary  Etiology: Cataracts, Chronic sinus N/A N/A Comorbid History: problems/congestion, Anemia, Chronic Obstructive Pulmonary Disease (COPD), Congestive Heart Failure, Coronary Artery Disease, Rheumatoid Arthritis, Neuropathy, Received Radiation 11/22/2021 N/A N/A Date Acquired: 6 N/A N/A Weeks of Treatment: Open N/A N/A Wound Status: No N/A N/A Wound Recurrence: 0x0x0 N/A N/A Measurements L x W x D (cm) 0 N/A N/A A (cm) : rea 0 N/A N/A Volume (cm) : 100.00% N/A N/A % Reduction in Area: 100.00% N/A N/A % Reduction in Volume: Full Thickness Without Exposed N/A N/A Classification: Support Structures None Present N/A N/A Exudate Amount: Distinct, outline attached N/A N/A Wound Margin: None Present (0%) N/A N/A Granulation Amount: None Present (0%) N/A N/A Necrotic Amount: Fascia: No N/A N/A Exposed Structures: Fat Layer (Subcutaneous Tissue): No Tendon: No Muscle: No Joint: No Bone: No Large (67-100%) N/A N/A Epithelialization: Excoriation: No N/A N/A Periwound Skin Texture: Induration: No Alexandria [name], Alexandria [name] ([id number]) [id number] Page 5 of 8 Callus: No Crepitus: No Rash: No Scarring: No Maceration: No N/A N/A Periwound Skin Moisture: Dry/Scaly: No Hemosiderin Staining: Yes N/A N/A Periwound Skin  Color: Atrophie Blanche: No Cyanosis: No Ecchymosis: No Erythema: No Mottled: No Pallor: No Rubor: No No Abnormality N/A N/A Temperature: Treatment Notes Wound #1 (Lower Leg) Wound Laterality: Right, Posterior Cleanser Peri-Wound [name] Topical Primary Dressing Secondary Dressing Secured With Compression Wrap Compression Stockings Add-Ons Electronic Signature(s) Signed: 01/15/2022 1:24:23 PM By: Kalman Shan DO Entered By: Kalman Shan on 01/15/2022 13:21:46 -------------------------------------------------------------------------------- Multi-Disciplinary [name] Plan Details Patient Name: Date of Service: Alexandria [name], Alexandria Alexandria LYN E. 01/14/2022 2:45 PM Medical Record Number: [id number] Patient Account Number: [id number] Date of Birth/Sex: Treating RN: [birth year] (86 y.o. Alexandria [name] Wyatt Thorstenson: Leonides Cave Other Clinician: Referring Karrina Lye: Treating Kwamaine Cuppett/Extender: Valda Lamb Weeks in Treatment: 6 Active Inactive Electronic Signature(s) Signed: 01/18/2022 4:10:29 PM By: Rhae Hammock RN Entered By: Rhae Hammock on 01/14/2022 15:26:45 -------------------------------------------------------------------------------- Pain Assessment Details Patient Name: Date of Service: Alexandria [name], Alexandria Alexandria LYN E. 01/14/2022 2:45 PM [name] ([id number]) [id number] Page 6 of 8 Medical Record Number: [id number] Patient Account Number: [id number] Date of Birth/Sex: Treating RN: 1935-04-25 (86 y.o. Harlow Ohms Primary [name] Sigourney Portillo: Leonides Cave Other Clinician: Referring Christpher Stogsdill: Treating Eilis Chestnutt/Extender: Valda Lamb Weeks in Treatment: 6 Active Problems Location of Pain Severity and Description of Pain Patient Has Paino No Site Locations Rate the pain. Current Pain Level: 0 Pain Management and Medication Current Pain Management: Electronic  Signature(s) Signed: 01/14/2022 3:49:20 PM By: Adline Peals Entered By: Adline Peals on 01/14/2022 14:53:08 -------------------------------------------------------------------------------- Patient/Caregiver Education Details Patient Name: Date of Service: Alexandria [name], Alexandria Alexandria LYN E. 11/9/2023andnbsp2:45 PM Medical Record Number: [id number] Patient Account Number: [id number] Date of Birth/Gender: Treating RN: [birth year] (86 y.o. Alexandria [name] Physician: Leonides Cave Other Clinician: Referring Physician: Treating Physician/Extender: Rudolpho Sevin in Treatment: 6 Education Assessment Education Provided To: Patient Education Topics Provided Wound/Skin Impairment: Methods: Explain/Verbal Responses: Reinforcements needed, State content correctly Electronic Signature(s) Signed: 01/18/2022 4:10:29 PM By: Rhae Hammock RN Entered By: Rhae Hammock on 01/14/2022 15:26:59 [name] ([id number]) [id number] Page 7 of 8 -------------------------------------------------------------------------------- Wound Assessment Details Patient Name: Date of Service: Alexandria [name], Alexandria Alexandria LYN E. 01/14/2022 2:45 PM Medical Record Number: [id number] Patient Account Number: [id number] Date of Birth/Sex: Treating RN: 07/09/1935 (86 y.o. Harlow Ohms Primary [name] Felma Pfefferle: Leonides Cave Other Clinician: Referring Florita Nitsch: Treating Luverna Degenhart/Extender: Valda Lamb Weeks in  Treatment: 6 Wound Status Wound Number: 1 Primary Venous Leg Ulcer Etiology: Wound Location: Right, Posterior Lower Leg Wound Open Wounding Event: Trauma Status: Date Acquired: 11/22/2021 Comorbid Cataracts, Chronic sinus problems/congestion, Anemia, Chronic Weeks Of Treatment: 6 History: Obstructive Pulmonary Disease (COPD), Congestive Heart Failure, Clustered Wound: No Coronary Artery Disease, Rheumatoid  Arthritis, Neuropathy, Received Radiation Photos Wound Measurements Length: (cm) Width: (cm) Depth: (cm) Area: (cm) Volume: (cm) 0 % Reduction in Area: 100% 0 % Reduction in Volume: 100% 0 Epithelialization: Large (67-100%) 0 Tunneling: No 0 Undermining: No Wound Description Classification: Full Thickness Without Exposed Support Wound Margin: Distinct, outline attached Exudate Amount: None Present Structures Foul Odor After Cleansing: No Slough/Fibrino No Wound Bed Granulation Amount: None Present (0%) Exposed Structure Necrotic Amount: None Present (0%) Fascia Exposed: No Fat Layer (Subcutaneous Tissue) Exposed: No Tendon Exposed: No Muscle Exposed: No Joint Exposed: No Bone Exposed: No Periwound Skin Texture Texture Color No Abnormalities Noted: Yes No Abnormalities Noted: No Atrophie Blanche: No Moisture Cyanosis: No No Abnormalities Noted: Yes Ecchymosis: No Erythema: No Hemosiderin Staining: Yes Mottled: No Pallor: No Rubor: No Temperature / Pain Temperature: No Abnormality Alexandria [name], Alexandria [name] ([id number]) [id number] Page 8 of 8 Electronic Signature(s) Signed: 01/14/2022 3:49:20 PM By: Adline Peals Entered By: Adline Peals on 01/14/2022 15:00:08 -------------------------------------------------------------------------------- Vitals Details Patient Name: Date of Service: Alexandria [name], Alexandria Alexandria LYN E. 01/14/2022 2:45 PM Medical Record Number: [id number] Patient Account Number: [id number] Date of Birth/Sex: Treating RN: [birth year] (86 y.o. Harlow Ohms Primary [name] Kyeshia Zinn: Leonides Cave Other Clinician: Referring Shaquanta Harkless: Treating Micheline Markes/Extender: Valda Lamb Weeks in Treatment: 6 Vital Signs Time Taken: 14:52 Temperature (F): 97.9 Height (in): 65 Pulse (bpm): 83 Weight (lbs): 219.5 Respiratory Rate (breaths/min): 18 Body Mass Index (BMI): 36.5 Blood Pressure (mmHg):  143/80 Reference Range: 80 - 120 mg / dl Electronic Signature(s) Signed: 01/14/2022 3:49:20 PM By: Adline Peals Entered By: Adline Peals on 01/14/2022 14:53:01

## 2022-01-19 MED FILL — Iron Sucrose Inj 20 MG/ML (Fe Equiv): INTRAVENOUS | Qty: 10 | Status: AC

## 2022-01-20 ENCOUNTER — Inpatient Hospital Stay: Payer: Medicare Other

## 2022-01-20 DIAGNOSIS — Z9981 Dependence on supplemental oxygen: Secondary | ICD-10-CM | POA: Diagnosis not present

## 2022-01-20 DIAGNOSIS — D509 Iron deficiency anemia, unspecified: Secondary | ICD-10-CM | POA: Diagnosis not present

## 2022-01-20 DIAGNOSIS — D649 Anemia, unspecified: Secondary | ICD-10-CM

## 2022-01-20 DIAGNOSIS — Z79899 Other long term (current) drug therapy: Secondary | ICD-10-CM | POA: Diagnosis not present

## 2022-01-20 DIAGNOSIS — Z87891 Personal history of nicotine dependence: Secondary | ICD-10-CM | POA: Diagnosis not present

## 2022-01-20 DIAGNOSIS — J449 Chronic obstructive pulmonary disease, unspecified: Secondary | ICD-10-CM | POA: Diagnosis not present

## 2022-01-20 DIAGNOSIS — Z85118 Personal history of other malignant neoplasm of bronchus and lung: Secondary | ICD-10-CM | POA: Diagnosis not present

## 2022-01-20 MED ORDER — SODIUM CHLORIDE 0.9 % IV SOLN
Freq: Once | INTRAVENOUS | Status: AC
  Filled 2022-01-20: qty 250

## 2022-01-20 MED ORDER — METHYLPREDNISOLONE SODIUM SUCC 125 MG IJ SOLR
40.0000 mg | Freq: Once | INTRAMUSCULAR | Status: AC
  Administered 2022-01-20: 40 mg via INTRAVENOUS
  Filled 2022-01-20: qty 2

## 2022-01-20 MED ORDER — SODIUM CHLORIDE 0.9 % IV SOLN
200.0000 mg | Freq: Once | INTRAVENOUS | Status: AC
  Administered 2022-01-20: 200 mg via INTRAVENOUS
  Filled 2022-01-20: qty 200

## 2022-01-20 NOTE — Patient Instructions (Signed)

## 2022-01-22 MED FILL — Iron Sucrose Inj 20 MG/ML (Fe Equiv): INTRAVENOUS | Qty: 10 | Status: AC

## 2022-01-25 ENCOUNTER — Telehealth: Payer: Self-pay | Admitting: Medical Oncology

## 2022-01-25 ENCOUNTER — Inpatient Hospital Stay: Payer: Medicare Other

## 2022-01-25 NOTE — Telephone Encounter (Signed)
Pt left answering service message about her appt today. I called her and she wants to reschedule to tomorrow 01/26/2022. Scheduler, Infusion Nurse Radonna Ricker, and pharmacy was made aware.

## 2022-01-26 ENCOUNTER — Telehealth: Payer: Self-pay | Admitting: *Deleted

## 2022-01-26 ENCOUNTER — Inpatient Hospital Stay: Payer: Medicare Other

## 2022-01-26 VITALS — BP 126/42 | HR 85 | Temp 97.8°F | Resp 18

## 2022-01-26 DIAGNOSIS — Z9981 Dependence on supplemental oxygen: Secondary | ICD-10-CM | POA: Diagnosis not present

## 2022-01-26 DIAGNOSIS — D649 Anemia, unspecified: Secondary | ICD-10-CM

## 2022-01-26 DIAGNOSIS — Z79899 Other long term (current) drug therapy: Secondary | ICD-10-CM | POA: Diagnosis not present

## 2022-01-26 DIAGNOSIS — Z87891 Personal history of nicotine dependence: Secondary | ICD-10-CM | POA: Diagnosis not present

## 2022-01-26 DIAGNOSIS — J449 Chronic obstructive pulmonary disease, unspecified: Secondary | ICD-10-CM | POA: Diagnosis not present

## 2022-01-26 DIAGNOSIS — D509 Iron deficiency anemia, unspecified: Secondary | ICD-10-CM | POA: Diagnosis not present

## 2022-01-26 DIAGNOSIS — Z85118 Personal history of other malignant neoplasm of bronchus and lung: Secondary | ICD-10-CM | POA: Diagnosis not present

## 2022-01-26 MED ORDER — SODIUM CHLORIDE 0.9 % IV SOLN
Freq: Once | INTRAVENOUS | Status: AC
  Filled 2022-01-26: qty 250

## 2022-01-26 MED ORDER — METHYLPREDNISOLONE SODIUM SUCC 125 MG IJ SOLR
40.0000 mg | Freq: Once | INTRAMUSCULAR | Status: AC
  Administered 2022-01-26: 40 mg via INTRAVENOUS
  Filled 2022-01-26: qty 2

## 2022-01-26 MED ORDER — SODIUM CHLORIDE 0.9 % IV SOLN
200.0000 mg | Freq: Once | INTRAVENOUS | Status: AC
  Administered 2022-01-26: 200 mg via INTRAVENOUS
  Filled 2022-01-26: qty 200

## 2022-01-26 NOTE — Telephone Encounter (Signed)
RETURNED PATIENT'S DAUGHTER'S PHONE CALL, SPOKE WITH PATIENT'S DAUGHTER- LATONYA LEBOLD

## 2022-01-27 ENCOUNTER — Ambulatory Visit: Payer: Medicare Other

## 2022-02-02 DIAGNOSIS — F4323 Adjustment disorder with mixed anxiety and depressed mood: Secondary | ICD-10-CM | POA: Diagnosis not present

## 2022-02-10 ENCOUNTER — Encounter: Payer: Self-pay | Admitting: Medical Oncology

## 2022-02-10 ENCOUNTER — Inpatient Hospital Stay: Payer: Medicare Other

## 2022-02-10 ENCOUNTER — Inpatient Hospital Stay (HOSPITAL_BASED_OUTPATIENT_CLINIC_OR_DEPARTMENT_OTHER): Payer: Medicare Other | Admitting: Medical Oncology

## 2022-02-10 ENCOUNTER — Inpatient Hospital Stay: Payer: Medicare Other | Attending: Internal Medicine

## 2022-02-10 VITALS — BP 148/61 | HR 67 | Resp 20

## 2022-02-10 VITALS — BP 150/62 | HR 76 | Temp 97.4°F | Wt 224.0 lb

## 2022-02-10 DIAGNOSIS — Z79899 Other long term (current) drug therapy: Secondary | ICD-10-CM | POA: Insufficient documentation

## 2022-02-10 DIAGNOSIS — L03115 Cellulitis of right lower limb: Secondary | ICD-10-CM | POA: Diagnosis not present

## 2022-02-10 DIAGNOSIS — Z9981 Dependence on supplemental oxygen: Secondary | ICD-10-CM | POA: Diagnosis not present

## 2022-02-10 DIAGNOSIS — Z87891 Personal history of nicotine dependence: Secondary | ICD-10-CM | POA: Diagnosis not present

## 2022-02-10 DIAGNOSIS — Z85118 Personal history of other malignant neoplasm of bronchus and lung: Secondary | ICD-10-CM | POA: Diagnosis not present

## 2022-02-10 DIAGNOSIS — D649 Anemia, unspecified: Secondary | ICD-10-CM

## 2022-02-10 DIAGNOSIS — M7989 Other specified soft tissue disorders: Secondary | ICD-10-CM | POA: Diagnosis not present

## 2022-02-10 DIAGNOSIS — I83813 Varicose veins of bilateral lower extremities with pain: Secondary | ICD-10-CM | POA: Diagnosis not present

## 2022-02-10 DIAGNOSIS — I739 Peripheral vascular disease, unspecified: Secondary | ICD-10-CM

## 2022-02-10 DIAGNOSIS — R5383 Other fatigue: Secondary | ICD-10-CM | POA: Diagnosis not present

## 2022-02-10 DIAGNOSIS — D509 Iron deficiency anemia, unspecified: Secondary | ICD-10-CM | POA: Insufficient documentation

## 2022-02-10 DIAGNOSIS — J449 Chronic obstructive pulmonary disease, unspecified: Secondary | ICD-10-CM | POA: Insufficient documentation

## 2022-02-10 LAB — CBC WITH DIFFERENTIAL/PLATELET
Abs Immature Granulocytes: 0.02 10*3/uL (ref 0.00–0.07)
Basophils Absolute: 0 10*3/uL (ref 0.0–0.1)
Basophils Relative: 1 %
Eosinophils Absolute: 0.1 10*3/uL (ref 0.0–0.5)
Eosinophils Relative: 2 %
HCT: 33.8 % — ABNORMAL LOW (ref 36.0–46.0)
Hemoglobin: 10.4 g/dL — ABNORMAL LOW (ref 12.0–15.0)
Immature Granulocytes: 1 %
Lymphocytes Relative: 23 %
Lymphs Abs: 0.9 10*3/uL (ref 0.7–4.0)
MCH: 30.2 pg (ref 26.0–34.0)
MCHC: 30.8 g/dL (ref 30.0–36.0)
MCV: 98.3 fL (ref 80.0–100.0)
Monocytes Absolute: 0.4 10*3/uL (ref 0.1–1.0)
Monocytes Relative: 9 %
Neutro Abs: 2.7 10*3/uL (ref 1.7–7.7)
Neutrophils Relative %: 64 %
Platelets: 169 10*3/uL (ref 150–400)
RBC: 3.44 MIL/uL — ABNORMAL LOW (ref 3.87–5.11)
RDW: 14.5 % (ref 11.5–15.5)
WBC: 4.1 10*3/uL (ref 4.0–10.5)
nRBC: 0 % (ref 0.0–0.2)

## 2022-02-10 LAB — BASIC METABOLIC PANEL
Anion gap: 8 (ref 5–15)
BUN: 23 mg/dL (ref 8–23)
CO2: 33 mmol/L — ABNORMAL HIGH (ref 22–32)
Calcium: 8.7 mg/dL — ABNORMAL LOW (ref 8.9–10.3)
Chloride: 99 mmol/L (ref 98–111)
Creatinine, Ser: 0.97 mg/dL (ref 0.44–1.00)
GFR, Estimated: 58 mL/min — ABNORMAL LOW (ref >60)
Glucose, Bld: 152 mg/dL — ABNORMAL HIGH (ref 70–99)
Potassium: 4.4 mmol/L (ref 3.5–5.1)
Sodium: 140 mmol/L (ref 135–145)

## 2022-02-10 MED ORDER — SODIUM CHLORIDE 0.9 % IV SOLN
Freq: Once | INTRAVENOUS | Status: AC
  Filled 2022-02-10: qty 250

## 2022-02-10 MED ORDER — SODIUM CHLORIDE 0.9 % IV SOLN
200.0000 mg | Freq: Once | INTRAVENOUS | Status: AC
  Administered 2022-02-10: 200 mg via INTRAVENOUS
  Filled 2022-02-10: qty 200

## 2022-02-10 MED ORDER — DOXYCYCLINE HYCLATE 100 MG PO TABS: 100.0000 mg | ORAL_TABLET | Freq: Two times a day (BID) | ORAL | 0 refills | Status: AC

## 2022-02-10 MED ORDER — METHYLPREDNISOLONE SODIUM SUCC 125 MG IJ SOLR
40.0000 mg | Freq: Once | INTRAMUSCULAR | Status: AC
  Administered 2022-02-10: 40 mg via INTRAVENOUS
  Filled 2022-02-10: qty 2

## 2022-02-10 NOTE — Progress Notes (Signed)
Patient complains of swelling of right leg x 2 weeks. Patient states its warm to touch and uncomfortable.

## 2022-02-10 NOTE — Progress Notes (Signed)
[facility] NOTE  Patient Care Team: Tonia Ghent, MD as PCP - General Rockey Situ, Kathlene November, MD as Consulting Physician (Cardiology) [name], NP as Nurse Practitioner (Adult Health Nurse Practitioner) Cammie Sickle, MD as Consulting Physician (Oncology)  CHIEF COMPLAINTS/PURPOSE OF CONSULTATION: ANEMIA   HEMATOLOGY HISTORY:  # CHRONIC INTERMITTENT ANEMIA  [since 2010]- Hb SEP 2022- 8; previously on PO Iron- ; WBC/platelets- Normal; MCV- 90s. EGD- > 15 years; /Colonoscopy:4 years- [Dr.Jacob;; in [place]] capsule-? Bone marrow Biopsy-none; OCT 2022- SEVERE IRON DEFICIENCY; myeloma work-up/hemolysis work-up negative; JAN 2023-CT scan duodenal thickening-recommend GI evaluation  #   AUG 2021- RIGHT LUNG stage I non-small cell lung cancer-  s/p SBRT   [Dr.Moody; [place]]; NOV 2022- CT chest-no recurrent disease.  # COPD on 2.5 lit/day [Dr.Sood]; diastolic CHF/ OCT 5852-DPO BP [needing to come off losartan- Dr.Gollan]; peripheral neuropathy.  HISTORY OF PRESENTING ILLNESS: Alone; 2.5 Lit/ O2 24 x7. In wheel chair.  Ice book to the patient's daughter over the phone  Alexandria [name] 86 y.o.  female with chronic respiratory failure on oxygen and severe anemia secondary to iron deficiency of unclear etiology s/p venofer is here for follow-up.  She mentions today that she has had swelling of her right leg for over a month. Originally had a wound of her right calf that was treated at the wound clinic. Was not happy there- felt as if they wrapped her legs too tightly. Tried using home compression stocks but they are painful. She has noticed a glossy hue and erythema of the shin area of her right lower leg over the past few weeks. Stable and not worsening. No fevers, discharge from the skin or skin breakdown. She has not tried anything for this. No calf pain, new SOB or any chest pain. Again she has not fever or chills.   In terms of her IDA she reports no concerns  or bleeding episodes. Tolerating her venofer well without side effects. Continues to have chronic fatigue.  She continues to deny any blood in stools or black-colored stools.    Review of Systems  Constitutional:  Positive for malaise/fatigue. Negative for chills, diaphoresis, fever and weight loss.  HENT:  Negative for nosebleeds and sore throat.   Eyes:  Negative for double vision.  Respiratory:  Positive for cough and shortness of breath. Negative for hemoptysis, sputum production and wheezing.   Cardiovascular:  Negative for chest pain, palpitations, orthopnea and leg swelling.  Gastrointestinal:  Negative for abdominal pain, blood in stool, constipation, diarrhea, heartburn, melena, nausea and vomiting.  Genitourinary:  Negative for dysuria, frequency and urgency.  Musculoskeletal:  Negative for back pain and joint pain.  Skin: Negative.  Negative for itching and rash.  Neurological:  Positive for dizziness. Negative for tingling, focal weakness, weakness and headaches.  Endo/Heme/Allergies:  Does not bruise/bleed easily.  Psychiatric/Behavioral:  Negative for depression. The patient is not nervous/anxious and does not have insomnia.     MEDICAL HISTORY:  Past Medical History:  Diagnosis Date   Allergy, unspecified not elsewhere classified    Anxiety    Cataract    Chronic diastolic congestive heart failure (HCC)    COPD (chronic obstructive pulmonary disease) ([place]) 03/08/1998   PFTs 12/12/2002 FEV 1 1.42 (64%) ratio 58 with no better after B2 and DLCO75%; PFTs 11/20/09 FEV1 1.50 (73%) ratio 50 no better after B2 with DLCO 62%; Hfa 50% 11/20/2009 >75%, 01/13/10 p coaching   Depression 12/07/1998   Diabetes mellitus type II  02/05/2006   Dr. Cruzita Lederer with endo   Disorders of bursae and tendons in shoulder region, unspecified    Rotator cuff syndrome, right   E. coli bacteremia    Esophagitis    GERD (gastroesophageal reflux disease)    History of UTI    Hyperlipidemia 10/04/2000    Hypertension 03/08/1992   Hypothyroidism 03/08/1968   Iron deficiency anemia    Lung nodule    radiation starts 10-08-2019   Malignant neoplasm of right upper lobe of lung ([place]) 07/24/2019   Obesity    NOS   OSA (obstructive sleep apnea)    PSG 01/27/10 AHI 13, pt does not know CPAP settings   Peripheral neuropathy    Likely due to DM per Dr. Murvin Natal hernia     SURGICAL HISTORY: Past Surgical History:  Procedure Laterality Date   ABD U/S  03/19/1999   Nml x2 foci in liver   ADENOSINE MYOVIEW  06/02/2007   Nml   CARDIOLITE PERSANTINE  08/24/2000   Nml   CAROTID U/S  08/24/2000   1-39% ICA stenosis   CAROTID U/S  06/02/2007   No apprec change    CARPAL TUNNEL RELEASE  12/1997   Right   CESAREAN SECTION     x2 [place]/ repeat   CHOLECYSTECTOMY  1997   COLONOSCOPY WITH PROPOFOL N/A 02/12/2016   Procedure: COLONOSCOPY WITH PROPOFOL;  Surgeon: Milus Banister, MD;  Location: [facility];  Service: Endoscopy;  Laterality: N/A;   CT ABD W & PELVIS WO/W CM     Abd hemangiomas of liver, 1 cm R renal cyst   DENTAL SURGERY  2016   Implants   DEXA  07/03/2003   Nml   ESOPHAGOGASTRODUODENOSCOPY  12/05/1997   Nml (due to hoarseness)   ESOPHAGOGASTRODUODENOSCOPY (EGD) WITH PROPOFOL N/A 02/12/2016   Procedure: ESOPHAGOGASTRODUODENOSCOPY (EGD) WITH PROPOFOL;  Surgeon: Milus Banister, MD;  Location: [facility];  Service: Endoscopy;  Laterality: N/A;   GALLBLADDER SURGERY     HERNIA REPAIR  01/24/2009   Lap Ventr w/ Lysis of adhesions (Dr. Donne Hazel)   knee arthroscopic surgery  years ago   right   ROTATOR CUFF REPAIR  1984   Right, [place]   SHOULDER OPEN ROTATOR CUFF REPAIR  02/08/2012   Procedure: ROTATOR CUFF REPAIR SHOULDER OPEN;  Surgeon: Magnus Sinning, MD;  Location: [facility];  Service: Orthopedics;  Laterality: Left;  Left Shoulder Open Anterior Acrominectomy Rotator Cuff Repair Open Distal Clavicle Resection ,tissue mend graft, and repair of biceps tendon   THUMB  RELEASE  12/1997   Right   TONSILLECTOMY     TOTAL ABDOMINAL HYSTERECTOMY  1985   Due to dysmennorhea   US ECHOCARDIOGRAPHY  06/02/2007    SOCIAL HISTORY: Social History   Socioeconomic History   Marital status: Widowed    Spouse name: Not on file   Number of children: 2   Years of education: Not on file   Highest education level: Not on file  Occupational History   Occupation: Retired    Fish farm manager: RETIRED  Tobacco Use   Smoking status: Former    Packs/day: 2.00    Years: 50.00    Total pack years: 100.00    Types: Cigarettes    Quit date: 02/05/2005    Years since quitting: 17.0   Smokeless tobacco: Never  Vaping Use   Vaping Use: Never used  Substance and Sexual Activity   Alcohol use: Yes    Comment: occasionally   Drug  use: Never   Sexual activity: Not Currently  Other Topics Concern   Not on file  Social History Narrative   Widowed 2023, 2 children; Enjoys painting      Quit smoking in 2007; no alcohol; lives in [place]; [name]/d lives in [place]. Had own business. Dont drive- sec to neuropathy.    Social Determinants of Health   Financial Resource Strain: Not on file  Food Insecurity: Not on file  Transportation Needs: Not on file  Physical Activity: Not on file  Stress: Not on file  Social Connections: Not on file  Intimate Partner Violence: Not on file    FAMILY HISTORY: Family History  Problem Relation Age of Onset   Heart disease Mother    Thyroid disease Mother    Emphysema Father        One lung   Cystic fibrosis Sister    Hyperthyroidism Sister    Osteoarthritis Brother    Hyperthyroidism Brother    Esophageal cancer Neg Hx    Cancer Neg Hx        Head or neck   Colon cancer Neg Hx    Stomach cancer Neg Hx    Breast cancer Neg Hx     ALLERGIES:  is allergic to antihistamines, diphenhydramine-type; sulfonamide derivatives; incruse ellipta [umeclidinium bromide]; clarithromycin; codeine; lyrica [pregabalin]; spiriva handihaler  [tiotropium bromide monohydrate]; and vraylar [cariprazine].  MEDICATIONS:  Current Outpatient Medications  Medication Sig Dispense Refill   albuterol (PROAIR HFA) 108 (90 Base) MCG/ACT inhaler Inhale 2 puffs into the lungs every 6 (six) hours as needed for wheezing or shortness of breath. INHALE 2 PUFFS INTO THE LUNGS EVERY 6 HOURS AS NEEDED FOR WHEEZING OR SHORTNESS OF BREATH 3 Inhaler 3   arformoterol (BROVANA) 15 MCG/2ML NEBU Take 2 mLs (15 mcg total) by nebulization at bedtime. 120 mL 6   budesonide (PULMICORT) 0.5 MG/2ML nebulizer solution Take 2 mLs (0.5 mg total) by nebulization in the morning and at bedtime. 360 mL 3   busPIRone (BUSPAR) 15 MG tablet Take 1 tablet (15 mg total) by mouth 2 (two) times daily. With extra 1/2 tab midday     Cholecalciferol (VITAMIN D) 50 MCG (2000 UT) tablet Take 1 tablet (2,000 Units total) by mouth daily.     Coenzyme Q-10 200 MG CAPS Take 200 mg by mouth daily.     Cyanocobalamin (B-12) 5000 MCG CAPS Take 5,000 mcg by mouth daily.     cyclobenzaprine (FLEXERIL) 10 MG tablet Take 1 tablet (10 mg total) by mouth at bedtime. 10 tablet 0   esomeprazole (NEXIUM) 40 MG capsule Take 1 capsule (40 mg total) by mouth daily at 12 noon. 30 capsule 11   ezetimibe (ZETIA) 10 MG tablet TAKE 1 TABLET BY MOUTH AT BEDTIME 90 tablet 2   fexofenadine (ALLEGRA) 180 MG tablet Take 180 mg by mouth as needed.      FLUoxetine (PROZAC) 20 MG tablet Take 20 mg by mouth 2 (two) times daily.     fluticasone (FLONASE) 50 MCG/ACT nasal spray USE 2 SPRAYS INTO THE NOSE EVERY 12 HOURS AS NEEDED FOR STUFFY NOSE 48 mL 2   furosemide (LASIX) 20 MG tablet Take 1-2 tablets (20-40 mg total) by mouth daily as needed for fluid. 90 tablet 1   glucose blood (ONETOUCH VERIO) test strip USE AS INSTRUCTED TO CHECK SUGAR 1 TIME DAILY 100 strip 8   guaiFENesin (MUCINEX) 600 MG 12 hr tablet Take 2 tablets (1,200 mg total) by mouth 2 (two) times  daily as needed for cough or to loosen phlegm.     Iron,  Ferrous Sulfate, 325 (65 Fe) MG TABS Take 325 mg by mouth daily. 90 tablet 3   levothyroxine (SYNTHROID) 125 MCG tablet Take 1 tablet (125 mcg total) by mouth daily. 90 tablet 3   metFORMIN (GLUCOPHAGE) 500 MG tablet Take 1 tablet (500 mg total) by mouth every evening. 90 tablet 3   nitroGLYCERIN (NITROSTAT) 0.4 MG SL tablet Place 1 tablet (0.4 mg total) under the tongue every 5 (five) minutes as needed for chest pain. 25 tablet 3   ONETOUCH DELICA LANCETS FINE MISC USE TO CHECK SUGAR 1 TIME DAILY 100 each 5   rosuvastatin (CRESTOR) 10 MG tablet Take 1 tablet (10 mg total) by mouth every other day. 45 tablet 2   Spacer/Aero Chamber Mouthpiece MISC Use with  inhaler as needed.  J44.9 1 each 0   No current facility-administered medications for this visit.      PHYSICAL EXAMINATION:   Vitals:   02/10/22 1430  BP: (!) 150/62  Pulse: 76  Temp: (!) 97.4 F (36.3 C)   Filed Weights   02/10/22 1430  Weight: 224 lb (101.6 kg)    Physical Exam Vitals and nursing note reviewed.  HENT:     Head: Normocephalic and atraumatic.     Mouth/Throat:     Pharynx: Oropharynx is clear.  Eyes:     Extraocular Movements: Extraocular movements intact.     Pupils: Pupils are equal, round, and reactive to light.  Cardiovascular:     Rate and Rhythm: Normal rate and regular rhythm.  Pulmonary:     Comments: Decreased breath sounds bilaterally.  Scattered wheezing. Abdominal:     Palpations: Abdomen is soft.  Musculoskeletal:        General: Normal range of motion.     Cervical back: Normal range of motion.  Skin:    General: Skin is warm.  Neurological:     General: No focal deficit present.     Mental Status: She is alert and oriented to person, place, and time.  Psychiatric:        Behavior: Behavior normal.        Judgment: Judgment normal.        LABORATORY DATA:  I have reviewed the data as listed Lab Results  Component Value Date   WBC 4.1 02/10/2022   HGB 10.4 (L)  02/10/2022   HCT 33.8 (L) 02/10/2022   MCV 98.3 02/10/2022   PLT 169 02/10/2022   Recent Labs    06/08/21 1547 07/24/21 1252 11/25/21 1319 12/30/21 1412 02/10/22 1411  NA 142   < > 138 142 140  K 4.7   < > 4.1 4.8 4.4  CL 103   < > 101 105 99  CO2 31   < > 33* 33* 33*  GLUCOSE 91   < > 154* 96 152*  BUN 21   < > _0 CREATININE 0.80   < > 0.69 0.88 0.97  CALCIUM 9.6   < > 8.6* 8.6* 8.7*  GFRNONAA  --    < > >60 >60 58*  PROT 6.9  --   --  6.9  --   ALBUMIN 3.9  --   --  3.5  --   AST 13  --   --  15  --   ALT 7  --   --  11  --   ALKPHOS 67  --   --  65  --   BILITOT 0.4  --   --  0.4  --    < > = values in this interval not displayed.     Encounter Diagnoses  Name Primary?   PAD (peripheral artery disease) (HCC) Yes   Leg swelling    Varicose veins of both lower extremities with pain    The erythema of her leg and glossy hue is concerning for a chronic mild cellulitis. She is afebrile and does not appear toxic. Treating her with doxycyline. Discussed. In addition I think she would benefit from seeing vascular and vein to help with her chronic edema which may or may not be related to lymphedema/varicosities. I have placed a referral and alerted patient.    For her IDA that is chronic- labs look good today with improvement of her hemoglobin from 8.7 to 10.4 within just over a month. Venofer today. RTC 1 month MD, labs (CBC w/, CMP, Iron/TIBC, Ferritin, reticulotyes, +- Venofer   All questions were answered. The patient knows to call the clinic with any problems, questions or concerns.    Hughie Closs, PA-C 02/10/2022 2:45 PM

## 2022-02-10 NOTE — Patient Instructions (Signed)

## 2022-02-10 NOTE — Patient Instructions (Signed)
[facility] [place] [address] [place], [place] - phone

## 2022-02-12 ENCOUNTER — Telehealth: Payer: Self-pay | Admitting: *Deleted

## 2022-02-12 NOTE — Telephone Encounter (Signed)
RETURNED PATIENT'S DAUGHTER'S PHONE CALL, SPOKE WITH PATIENT'S DAUGHTER- Alexandria [name]

## 2022-02-17 ENCOUNTER — Emergency Department: Payer: Medicare Other

## 2022-02-17 ENCOUNTER — Other Ambulatory Visit: Payer: Self-pay

## 2022-02-17 ENCOUNTER — Emergency Department
Admission: EM | Admit: 2022-02-17 | Discharge: 2022-02-17 | Disposition: A | Discharge status: Home / Self Care | Payer: Medicare Other | Attending: Emergency Medicine | Admitting: Emergency Medicine

## 2022-02-17 ENCOUNTER — Encounter: Payer: Self-pay | Admitting: Emergency Medicine

## 2022-02-17 DIAGNOSIS — R2241 Localized swelling, mass and lump, right lower limb: Secondary | ICD-10-CM | POA: Diagnosis present

## 2022-02-17 DIAGNOSIS — I509 Heart failure, unspecified: Secondary | ICD-10-CM | POA: Insufficient documentation

## 2022-02-17 DIAGNOSIS — R609 Edema, unspecified: Secondary | ICD-10-CM | POA: Diagnosis not present

## 2022-02-17 DIAGNOSIS — L03115 Cellulitis of right lower limb: Secondary | ICD-10-CM | POA: Insufficient documentation

## 2022-02-17 DIAGNOSIS — R600 Localized edema: Secondary | ICD-10-CM | POA: Diagnosis not present

## 2022-02-17 DIAGNOSIS — R0602 Shortness of breath: Secondary | ICD-10-CM | POA: Diagnosis not present

## 2022-02-17 DIAGNOSIS — M7989 Other specified soft tissue disorders: Secondary | ICD-10-CM | POA: Diagnosis not present

## 2022-02-17 LAB — COMPREHENSIVE METABOLIC PANEL
ALT: 11 U/L (ref 0–44)
AST: 13 U/L — ABNORMAL LOW (ref 15–41)
Albumin: 3.5 g/dL (ref 3.5–5.0)
Alkaline Phosphatase: 85 U/L (ref 38–126)
Anion gap: 4 — ABNORMAL LOW (ref 5–15)
BUN: 26 mg/dL — ABNORMAL HIGH (ref 8–23)
CO2: 32 mmol/L (ref 22–32)
Calcium: 8.8 mg/dL — ABNORMAL LOW (ref 8.9–10.3)
Chloride: 105 mmol/L (ref 98–111)
Creatinine, Ser: 0.77 mg/dL (ref 0.44–1.00)
GFR, Estimated: >60 mL/min (ref >60)
Glucose, Bld: 102 mg/dL — ABNORMAL HIGH (ref 70–99)
Potassium: 3.9 mmol/L (ref 3.5–5.1)
Sodium: 141 mmol/L (ref 135–145)
Total Bilirubin: 0.6 mg/dL (ref 0.3–1.2)
Total Protein: 6.5 g/dL (ref 6.5–8.1)

## 2022-02-17 LAB — TROPONIN I (HIGH SENSITIVITY)
Troponin I (High Sensitivity): 10 ng/L (ref <18)
Troponin I (High Sensitivity): 12 ng/L (ref <18)

## 2022-02-17 LAB — CBC WITH DIFFERENTIAL/PLATELET
Abs Immature Granulocytes: 0.03 10*3/uL (ref 0.00–0.07)
Basophils Absolute: 0 10*3/uL (ref 0.0–0.1)
Basophils Relative: 0 %
Eosinophils Absolute: 0.1 10*3/uL (ref 0.0–0.5)
Eosinophils Relative: 2 %
HCT: 33.5 % — ABNORMAL LOW (ref 36.0–46.0)
Hemoglobin: 10.2 g/dL — ABNORMAL LOW (ref 12.0–15.0)
Immature Granulocytes: 0 %
Lymphocytes Relative: 15 %
Lymphs Abs: 1.1 10*3/uL (ref 0.7–4.0)
MCH: 30.3 pg (ref 26.0–34.0)
MCHC: 30.4 g/dL (ref 30.0–36.0)
MCV: 99.4 fL (ref 80.0–100.0)
Monocytes Absolute: 0.6 10*3/uL (ref 0.1–1.0)
Monocytes Relative: 8 %
Neutro Abs: 5.8 10*3/uL (ref 1.7–7.7)
Neutrophils Relative %: 75 %
Platelets: 171 10*3/uL (ref 150–400)
RBC: 3.37 MIL/uL — ABNORMAL LOW (ref 3.87–5.11)
RDW: 14.2 % (ref 11.5–15.5)
WBC: 7.6 10*3/uL (ref 4.0–10.5)
nRBC: 0 % (ref 0.0–0.2)

## 2022-02-17 LAB — BRAIN NATRIURETIC PEPTIDE: B Natriuretic Peptide: 257.2 pg/mL — ABNORMAL HIGH (ref 0.0–100.0)

## 2022-02-17 MED ORDER — FUROSEMIDE 10 MG/ML IJ SOLN
40.0000 mg | Freq: Once | INTRAMUSCULAR | Status: AC
  Administered 2022-02-17: 40 mg via INTRAVENOUS
  Filled 2022-02-17: qty 4

## 2022-02-17 MED ORDER — CEPHALEXIN 500 MG PO CAPS: 500.0000 mg | ORAL_CAPSULE | Freq: Four times a day (QID) | ORAL | 0 refills | Status: AC

## 2022-02-17 MED ORDER — DOXYCYCLINE MONOHYDRATE 100 MG PO TABS: 100.0000 mg | ORAL_TABLET | Freq: Two times a day (BID) | ORAL | 0 refills | Status: AC

## 2022-02-17 MED ORDER — SODIUM CHLORIDE 0.9 % IV SOLN
1.0000 g | Freq: Once | INTRAVENOUS | Status: AC
  Administered 2022-02-17: 1 g via INTRAVENOUS
  Filled 2022-02-17: qty 10

## 2022-02-17 NOTE — ED Notes (Signed)
Patient c/o right leg swelling for two weeks. Patient states she wears compression hose during the day and the leg feels "soft" but feels "hard" after she takes them off. Patient does not have on compression hose at this time. Patient states she has seen her PMD and was referred to a vascular specialist, but  did not have an appointment, so she came here.

## 2022-02-17 NOTE — ED Provider Notes (Signed)
[facility] Provider Note  Patient Contact: 3:47 PM (approximate)   History   Leg Swelling   HPI  Alexandria [name] is a 86 y.o. female with a history of depression, hypothyroidism, hypertension, hyperlipidemia, COPD and CHF and peripheral artery disease last evaluated by Dr. Rockey Situ presents to the emergency department with worsening right lower extremity swelling and erythema.  Patient anticipated seeing vascular after referral by her hematology oncology team but has not yet seen a vascular provider yet.  Patient states that she wears 3 L chronically and has had no new oxygen demands but is increasingly intolerant of supine position or rest.  She denies fever and chills.  No chest pain or chest tightness.  She states that she is not currently anticoagulated.      Physical Exam   Triage Vital Signs: ED Triage Vitals  Enc Vitals Group     BP 02/17/22 1353 (!) 147/68     Pulse Rate 02/17/22 1351 79     Resp 02/17/22 1351 20     Temp 02/17/22 1351 98.4 F (36.9 C)     Temp Source 02/17/22 1351 Oral     SpO2 02/17/22 1351 96 %     Weight 02/17/22 1353 224 lb (101.6 kg)     Height 02/17/22 1353 5\' 5"  (1.651 m)     Head Circumference --      Peak Flow --      Pain Score 02/17/22 1352 4     Pain Loc --      Pain Edu? --      Excl. in [place]? --     Most recent vital signs: Vitals:   02/17/22 1351 02/17/22 1353  BP:  (!) 147/68  Pulse: 79   Resp: 20   Temp: 98.4 F (36.9 C)   SpO2: 96%      General: Alert and in no acute distress. Eyes:  PERRL. EOMI. Head: No acute traumatic findings ENT:      Nose: No congestion/rhinnorhea.      Mouth/Throat: Mucous membranes are moist. Neck: No stridor. No cervical spine tenderness to palpation. Cardiovascular:  Good peripheral perfusion Respiratory: Normal respiratory effort without tachypnea or retractions. Lungs CTAB. Good air entry to the bases with no decreased or absent breath sounds. Gastrointestinal:  Bowel sounds 4 quadrants. Soft and nontender to palpation. No guarding or rigidity. No palpable masses. No distention. No CVA tenderness. Musculoskeletal: Full range of motion to all extremities.  Neurologic:  No gross focal neurologic deficits are appreciated.  Skin: Patient has 3+ pitting edema on the right and 2+ pitting edema on the left with worsening erythema and swelling of the right lower extremity.  Patient has a palpable dorsalis pedis pulse with Doppler.  Delayed capillary refill bilaterally. Other:   ED Results / Procedures / Treatments   Labs (all labs ordered are listed, but only abnormal results are displayed) Labs Reviewed  COMPREHENSIVE METABOLIC PANEL - Abnormal; Notable for the following components:      Result Value   Glucose, Bld 102 (*)    BUN 26 (*)    Calcium 8.8 (*)    AST 13 (*)    Anion gap 4 (*)    All other components within normal limits  CBC WITH DIFFERENTIAL/PLATELET - Abnormal; Notable for the following components:   RBC 3.37 (*)    Hemoglobin 10.2 (*)    HCT 33.5 (*)    All other components within normal limits  BRAIN NATRIURETIC PEPTIDE -  Abnormal; Notable for the following components:   B Natriuretic Peptide 257.2 (*)    All other components within normal limits  TROPONIN I (HIGH SENSITIVITY)  TROPONIN I (HIGH SENSITIVITY)        RADIOLOGY  I personally viewed and evaluated these images as part of my medical decision making, as well as reviewing the written report by the radiologist.  ED Provider Interpretation: Venous ultrasound unremarkable bilaterally.  Chest x-ray indicates consolidation of right lung apex consistent with patient's diagnosis of nonsmall cell lung cancer   PROCEDURES:  Critical Care performed: No  Procedures   MEDICATIONS ORDERED IN ED: Medications  cefTRIAXone (ROCEPHIN) 1 g in sodium chloride 0.9 % 100 mL IVPB (0 g Intravenous Stopped 02/17/22 1843)  furosemide (LASIX) injection 40 mg (40 mg Intravenous  Given 02/17/22 1811)     IMPRESSION / MDM / ASSESSMENT AND PLAN / ED COURSE  I reviewed the triage vital signs and the nursing notes.                              Assessment and plan: Lower extremity swelling 86 year old female presents to the emergency department with worsening lower extremity swelling.  Vital signs were reassuring at triage.  On exam, patient seemed very winded moving from her wheelchair to her exam table and had to be adjusted several times in order to feel less breathless.  Differential diagnosis includes worsening peripheral artery disease, CHF, cellulitis, DVT...  Will obtain venous ultrasound of the bilateral lower extremities as patient has not had similar study in the past 6 months.  Will obtain BNP and chest x-ray and will reassess.  Creatinine and BUN reassuring during this emergency department encounter.  Plan to administer IV diuretics, Lasix during this emergency department.  If venous ultrasound unremarkable, will start patient on Keflex and restart patient on doxycycline.  Agree with outpatient vascular referral and will also have patient follow-up at the CHF clinic.  Venous ultrasound shows no signs of DVT.  Patient was prescribed doxycycline and Keflex.  Will increase patient's Lasix to 40 mg daily for the next 5 days.  Attending, Dr. Kerman Passey agrees with management plan at this time.   FINAL CLINICAL IMPRESSION(S) / ED DIAGNOSES   Final diagnoses:  Peripheral edema  Cellulitis of leg, right     Rx / DC Orders   ED Discharge Orders          Ordered    AMB referral to CHF clinic        02/17/22 1704    doxycycline (ADOXA) 100 MG tablet  2 times daily        02/17/22 1839    cephALEXin (KEFLEX) 500 MG capsule  4 times daily        02/17/22 1839             Note:  This document was prepared using Dragon voice recognition software and may include unintentional dictation errors.   Vallarie Mare [place], PA-C 02/17/22 1854     Harvest Dark, MD 02/17/22 2234

## 2022-02-17 NOTE — Discharge Instructions (Addendum)
Please increase your Lasix from 20 to 40 mg daily over the next 5 days. You have been prescribed 2 antibiotics.  Only to take doxycycline twice daily for the next 7 days and Keflex 4 times daily for the next 7 days. You have been referred to the CHF clinic, who should be reaching out to you to schedule an appointment to see if they can help with your fluid overload status.

## 2022-02-17 NOTE — ED Triage Notes (Signed)
Patient arrives in wheelchair by POV c/o right leg redness and swelling. Saw her doctor last week and was supposed to be referred to vascular. Patient states her doctor never got her the information and is concerned due to pain and worsening redness.  Patient on home oxygen 2-3L [place].

## 2022-02-18 ENCOUNTER — Telehealth: Payer: Self-pay

## 2022-02-18 NOTE — Telephone Encounter (Signed)
Patient will have daughter call for follow up with our office.  Transition Care Management Follow-up Telephone Call Date of discharge and from where: 02/17/2022 from [facility]   How have you been since you were released from the hospital? Having some issues with swelling still. Starting abx today  Any questions or concerns? Yes  Items Reviewed: Did the pt receive and understand the discharge instructions provided? Yes  Medications obtained and verified? Yes  Other? No  Any new allergies since your discharge? No  Dietary orders reviewed? No Do you have support at home? Yes   Home Care and Equipment/Supplies: Were home health services ordered? not applicable If so, what is the name of the agency?   Has the agency set up a time to come to the patient's home? not applicable Were any new equipment or medical supplies ordered?   What is the name of the medical supply agency? N/A Were you able to get the supplies/equipment? not applicable Do you have any questions related to the use of the equipment or supplies? No  Functional Questionnaire: (I = Independent and D = Dependent) ADLs: Limited due to knee   Bathing/Dressing- I  Meal Prep- D  Eating- I  Maintaining continence- I  Transferring/Ambulation- I  Managing Meds- D  Follow up appointments reviewed:  PCP Hospital f/u appt confirmed? Patient will have daughter call for follow up with our office.  [facility] f/u appt confirmed? Patient daughter is calling for follow up with DR. Galland  Are transportation arrangements needed? No  If their condition worsens, is the pt aware to call PCP or go to the Emergency Dept.? Yes Was the patient provided with contact information for the PCP's office or ED? Yes Was to pt encouraged to call back with questions or concerns? Yes

## 2022-02-19 NOTE — Telephone Encounter (Signed)
LVM with patient in attempt to get her scheduled for a new patient CHF Clinic appointment after receiving a referral from ED.   Jabier Deese, NT

## 2022-02-23 ENCOUNTER — Encounter: Payer: Self-pay | Admitting: Physician Assistant

## 2022-02-23 ENCOUNTER — Other Ambulatory Visit: Payer: Self-pay | Admitting: *Deleted

## 2022-02-23 ENCOUNTER — Ambulatory Visit: Payer: Medicare Other | Attending: Physician Assistant | Admitting: Physician Assistant

## 2022-02-23 VITALS — BP 138/62 | HR 62 | Ht 65.0 in | Wt 225.4 lb

## 2022-02-23 DIAGNOSIS — I10 Essential (primary) hypertension: Secondary | ICD-10-CM | POA: Diagnosis not present

## 2022-02-23 DIAGNOSIS — L03115 Cellulitis of right lower limb: Secondary | ICD-10-CM | POA: Diagnosis not present

## 2022-02-23 DIAGNOSIS — I5020 Unspecified systolic (congestive) heart failure: Secondary | ICD-10-CM | POA: Diagnosis not present

## 2022-02-23 DIAGNOSIS — I5032 Chronic diastolic (congestive) heart failure: Secondary | ICD-10-CM

## 2022-02-23 DIAGNOSIS — R011 Cardiac murmur, unspecified: Secondary | ICD-10-CM | POA: Insufficient documentation

## 2022-02-23 DIAGNOSIS — I493 Ventricular premature depolarization: Secondary | ICD-10-CM | POA: Insufficient documentation

## 2022-02-23 DIAGNOSIS — J439 Emphysema, unspecified: Secondary | ICD-10-CM | POA: Diagnosis not present

## 2022-02-23 DIAGNOSIS — J4489 Other specified chronic obstructive pulmonary disease: Secondary | ICD-10-CM | POA: Insufficient documentation

## 2022-02-23 DIAGNOSIS — I25118 Atherosclerotic heart disease of native coronary artery with other forms of angina pectoris: Secondary | ICD-10-CM | POA: Insufficient documentation

## 2022-02-23 MED ORDER — ISOSORBIDE MONONITRATE ER 30 MG PO TB24: 30.0000 mg | ORAL_TABLET | Freq: Every day | ORAL | 3 refills | Status: DC

## 2022-02-23 MED ORDER — TORSEMIDE 20 MG PO TABS: 20.0000 mg | ORAL_TABLET | Freq: Every day | ORAL | 1 refills | Status: DC

## 2022-02-23 MED ORDER — POTASSIUM CHLORIDE ER 10 MEQ PO TBCR: 10.0000 meq | EXTENDED_RELEASE_TABLET | Freq: Every day | ORAL | 3 refills | Status: DC

## 2022-02-23 NOTE — Assessment & Plan Note (Signed)
She has a notable murmur on exam.  Her last echocardiogram did not demonstrate any significant valvular heart disease.  Given her recent symptoms of chest pain and volume excess, I will obtain a follow-up echocardiogram.

## 2022-02-23 NOTE — Patient Instructions (Addendum)
Medication Instructions:  Your physician has recommended you make the following change in your medication:   STOP Lasix  START Torsemide 20 mg taking 1 daily  START Imdur 30 mg taking 1 daily   START Potassium 10 meq taking 1 daily   *If you need a refill on your cardiac medications before your next appointment, please call your pharmacy*   Lab Work: TODAY:  BMET  1 WEEK:  BMET IN [place]  If you have labs (blood work) drawn today and your tests are completely normal, you will receive your results only by: [employer] (if you have MyChart) OR A paper copy in the mail If you have any lab test that is abnormal or we need to change your treatment, we will call you to review the results.   Testing/Procedures: Your physician has requested that you have an echocardiogramIN [place]. Echocardiography is a painless test that uses sound waves to create images of your heart. It provides your doctor with information about the size and shape of your heart and how well your heart's chambers and valves are working. This procedure takes approximately one hour. There are no restrictions for this procedure. Please do NOT wear cologne, perfume, aftershave, or lotions (deodorant is allowed). Please arrive 15 minutes prior to your appointment time.    Follow-Up: At [facility], you and your health needs are our priority.  As part of our continuing mission to provide you with exceptional heart care, we have created designated Provider Care Teams.  These Care Teams include your primary Cardiologist (physician) and Advanced Practice Providers (APPs -  Physician Assistants and Nurse Practitioners) who all work together to provide you with the care you need, when you need it.  We recommend signing up for the patient portal called "MyChart".  Sign up information is provided on this After Visit Summary.  MyChart is used to connect with patients for Virtual Visits (Telemedicine).  Patients are  able to view lab/test results, encounter notes, upcoming appointments, etc.  Non-urgent messages can be sent to your provider as well.   To learn more about what you can do with MyChart, go to [email or web address].    Your next appointment:   2-3 week(s)  The format for your next appointment:   In Person  Provider:   You may see DR. GOLLAN or one of the following Advanced Practice Providers on your designated Care Team:   Murray Hodgkins, NP Christell Faith, PA-C Cadence Kathlen Mody, PA-C Gerrie Nordmann, NP    Other Instructions   Important Information About Sugar

## 2022-02-23 NOTE — Assessment & Plan Note (Signed)
Blood pressure is somewhat borderline.  She notes higher blood pressures at home.  Add Imdur 30 mg daily as noted.

## 2022-02-23 NOTE — Progress Notes (Addendum)
Cardiology Office Note:    Date:  02/23/2022   ID:  [name], DOB June 23, 1935, MRN [id number]  PCP:  Tonia Ghent, MD  [facility] Providers Cardiologist:  None     Referring MD: Tonia Ghent, MD   Chief Complaint:  Hospitalization Follow-up (F/u from the ED - seen for leg swelling, CHF, ?cellulitis )    Patient Profile: Coronary artery Ca2+ on CT  Myoview 2012 no ischemia  (HFpEF) heart failure with preserved ejection fraction  TTE 02/11/2021: EF 55-60, no RWMA, GR 2 DD, normal RVSF, mild to moderate LAE, MAC, RAP 8 Peripheral arterial disease  Diabetes mellitus  Chronic kidney disease  Hypertension  Hyperlipidemia  Chronic Obstructive Pulmonary Disease  Former Smoker (50 years) Lung CA - XR, Chemo Obesity OSA Chronic leg edema (R>L) Orthostatic hypotension  Aortic atherosclerosis Carotid US 01/26/2015: Normal carotid arteries bilaterally     History of Present Illness:   LADON HENEY is a 86 y.o. female with the above problem list.  She was last seen by Dr. Rockey Situ 06/16/21.   She was seen in the emergency room 02/17/2022 at [facility] for lower extremity edema.  BNP was mildly elevated at 257.  Hemoglobin was stable at 10.2.  Troponins were negative.  Venous ultrasound was negative for DVT.  Chest x-ray demonstrated no acute disease.  She had stable right apical consolidation consistent with known history of non-small cell lung cancer.  She was treated with antibiotics for cellulitis.  Her furosemide was increased to 40 mg daily x 5 days.  She returns for follow-up.    She is here alone today.  However, she did put her daughter on speaker phone to participate in the interview.  She uses chronic O2.  She has chronic shortness of breath.  It is difficult for her to tell but, she does feel like her breathing has worsened recently.  She sleeps on an incline chronically.  She has chronic right greater than left lower extremity edema.   She has not had PND.  She does note occasional chest pressure that seems to occur with meals and resolves with antacids.  However, she also notes chest discomfort described as pressure with certain types of exertion.  She has not used any nitroglycerin.  The redness in her right leg continues.  She has a few days of antibiotics left and she sees her PCP at the end of this week.  She has not noticed any significant improvement in her lower extremity edema.  She finished up her 5 days of furosemide yesterday.  Her weight is unchanged.    EKG:  NSR, HR 71, normal axis, TW inversions 1, aVL, ant Qs, PVCs, QTc 467 - no change from prior except PVCs    Reviewed and updated this encounter:  Tobacco  Allergies  Meds  Problems  Med Hx  Surg Hx  Fam Hx     Review of Systems  Constitutional: Negative for chills and fever.  Gastrointestinal:  Negative for hematochezia and melena.    Labs/Other Test Reviewed:   Recent Labs: 08/06/2021: Pro B Natriuretic peptide (BNP) 169.0 09/11/2021: TSH 1.40 02/17/2022: ALT 11; B Natriuretic Peptide 257.2; BUN 26; Creatinine, Ser 0.77; Hemoglobin 10.2; Platelets 171; Potassium 3.9; Sodium 141   Recent Lipid Panel Recent Labs    08/06/21 1624  CHOL 140  TRIG 99.0  HDL 57.80  VLDL 19.8  LDLCALC 62    Risk Assessment/Calculations/Metrics:  Physical Exam:   VS:  BP 138/62   Pulse 62   Ht _0  (1.651 m)   Wt 225 lb 6.4 oz (102.2 kg)   LMP  (LMP Unknown)   SpO2 93%   BMI 37.51 kg/m    Wt Readings from Last 3 Encounters:  02/23/22 225 lb 6.4 oz (102.2 kg)  02/17/22 224 lb (101.6 kg)  02/10/22 224 lb (101.6 kg)    Constitutional:      Appearance: Healthy appearance. Not in distress.  Neck:     Vascular: JVR present.  Pulmonary:     Breath sounds: No rales.  Cardiovascular:     Normal rate. Irregular rhythm. Normal S1. Normal S2.      Murmurs: There is a grade 2/6 systolic murmur at the URSB.  Edema:    Pretibial: 1+ edema of the  left pretibial area and 3+ edema of the right pretibial area. Abdominal:     Palpations: Abdomen is soft.         ASSESSMENT & PLAN:   Chronic heart failure with preserved ejection fraction (HCC) She has not really had significant improvement in her lower extremity edema with 5 days of furosemide.  She has taken furosemide as needed in the past. She has significant lung disease and I suspect she likely has a component of right-sided heart failure. She also has venous insufficiency contributing to her edema. It would likely be difficult to control her edema with furosemide alone.  I have recommended to her and her daughter that we try to switch her to torsemide to see if this will provide better diuresis.  She has a lot of PVCs on EKG today.  I will obtain a BMET today to recheck her potassium. DC furosemide Start torsemide 20 mg daily Start K+ 10 mEq daily BMET 1 week Continue with compression stockings.  Obtain follow-up echocardiogram Follow-up 2 to 3 weeks  CAD (coronary artery disease) She has a history of coronary calcification on prior CT.  Her last stress test was in 2012.  She notes exertional chest discomfort described as pressure.  Her electrocardiogram demonstrates T wave inversions in 1 and aVL.  This is chronic without significant change when compared to multiple prior EKGs.  She certainly has risk factors for coronary artery disease.  Given her advanced age, chronic anemia and significant lung disease, I do not think she is a good candidate for invasive evaluation with cardiac catheterization.  Therefore, I am not certain that nuclear stress testing would provide much benefit.  I think it would be best to try to treat her medically.  I will discuss further with Dr. Rockey Situ to see if he prefers that she undergo stress testing. Start Imdur 30 mg daily Continue Crestor 10 mg daily Follow-up 2 to 3 weeks  Cellulitis She still has redness in her right leg.  She still has a few days of  antibiotics left.  She sees her PCP later this week.  I have advised her to keep this appointment to determine further management for her cellulitis.  Murmur She has a notable murmur on exam.  Her last echocardiogram did not demonstrate any significant valvular heart disease.  Given her recent symptoms of chest pain and volume excess, I will obtain a follow-up echocardiogram.  PVC's (premature ventricular contractions) Obtain BMET today.  As noted, she will have an echocardiogram.  If her EF is down, we may need to consider a 3-day ZIO to assess PVC burden.  Essential hypertension Blood  pressure is somewhat borderline.  She notes higher blood pressures at home.  Add Imdur 30 mg daily as noted.   ADDENDUM 03/08/2022 - Reviewed with Dr. Rockey Situ. He agreed to attempt medical therapy first with nitrates and adjusted diuretics. She has f/u later this month with cardiology in [place]. Richardson Dopp, PA-C          Dispo:  Return in about 3 weeks (around 03/16/2022) for Follow up after testing w/ Dr. Rockey Situ.  Medication Adjustments/Labs and Tests Ordered: Current medicines are reviewed at length with the patient today.  Concerns regarding medicines are outlined above.  Tests Ordered: Orders Placed This Encounter  Procedures   Basic metabolic panel   Basic metabolic panel   EKG [id number]   ECHOCARDIOGRAM COMPLETE   Medication Changes: Meds ordered this encounter  Medications   torsemide (DEMADEX) 20 MG tablet    Sig: Take 1 tablet (20 mg total) by mouth daily.    Dispense:  90 tablet    Refill:  1   potassium chloride (KLOR-CON) 10 MEQ tablet    Sig: Take 1 tablet (10 mEq total) by mouth daily.    Dispense:  90 tablet    Refill:  3   isosorbide mononitrate (IMDUR) 30 MG 24 hr tablet    Sig: Take 1 tablet (30 mg total) by mouth daily.    Dispense:  90 tablet    Refill:  3   Signed, Richardson Dopp, PA-C  02/23/2022 5:42 PM    [address], [place], [place]   [id number] Phone: [phone]; Fax: [phone]

## 2022-02-23 NOTE — Assessment & Plan Note (Addendum)
She has not really had significant improvement in her lower extremity edema with 5 days of furosemide.  She has taken furosemide as needed in the past. She has significant lung disease and I suspect she likely has a component of right-sided heart failure. She also has venous insufficiency contributing to her edema. It would likely be difficult to control her edema with furosemide alone.  I have recommended to her and her daughter that we try to switch her to torsemide to see if this will provide better diuresis.  She has a lot of PVCs on EKG today.  I will obtain a BMET today to recheck her potassium. DC furosemide Start torsemide 20 mg daily Start K+ 10 mEq daily BMET 1 week Continue with compression stockings.  Obtain follow-up echocardiogram Follow-up 2 to 3 weeks

## 2022-02-23 NOTE — Assessment & Plan Note (Signed)
She still has redness in her right leg.  She still has a few days of antibiotics left.  She sees her PCP later this week.  I have advised her to keep this appointment to determine further management for her cellulitis.

## 2022-02-23 NOTE — Assessment & Plan Note (Signed)
Obtain BMET today.  As noted, she will have an echocardiogram.  If her EF is down, we may need to consider a 3-day ZIO to assess PVC burden.

## 2022-02-23 NOTE — Assessment & Plan Note (Signed)
She has a history of coronary calcification on prior CT.  Her last stress test was in 2012.  She notes exertional chest discomfort described as pressure.  Her electrocardiogram demonstrates T wave inversions in 1 and aVL.  This is chronic without significant change when compared to multiple prior EKGs.  She certainly has risk factors for coronary artery disease.  Given her advanced age, chronic anemia and significant lung disease, I do not think she is a good candidate for invasive evaluation with cardiac catheterization.  Therefore, I am not certain that nuclear stress testing would provide much benefit.  I think it would be best to try to treat her medically.  I will discuss further with Dr. Rockey Situ to see if he prefers that she undergo stress testing. Start Imdur 30 mg daily Continue Crestor 10 mg daily Follow-up 2 to 3 weeks

## 2022-02-24 LAB — BASIC METABOLIC PANEL
BUN/Creatinine Ratio: 27 (ref 12–28)
BUN: 25 mg/dL (ref 8–27)
CO2: 27 mmol/L (ref 20–29)
Calcium: 9.4 mg/dL (ref 8.7–10.3)
Chloride: 101 mmol/L (ref 96–106)
Creatinine, Ser: 0.94 mg/dL (ref 0.57–1.00)
Glucose: 96 mg/dL (ref 70–99)
Potassium: 4.6 mmol/L (ref 3.5–5.2)
Sodium: 144 mmol/L (ref 134–144)
eGFR: 60 mL/min/{1.73_m2} (ref >59)

## 2022-02-25 ENCOUNTER — Ambulatory Visit (INDEPENDENT_AMBULATORY_CARE_PROVIDER_SITE_OTHER): Payer: Medicare Other | Admitting: Family Medicine

## 2022-02-25 ENCOUNTER — Encounter: Payer: Self-pay | Admitting: Family Medicine

## 2022-02-25 ENCOUNTER — Telehealth: Payer: Self-pay | Admitting: *Deleted

## 2022-02-25 VITALS — BP 118/82 | HR 88 | Temp 97.8°F | Ht 65.0 in | Wt 228.0 lb

## 2022-02-25 DIAGNOSIS — I25118 Atherosclerotic heart disease of native coronary artery with other forms of angina pectoris: Secondary | ICD-10-CM

## 2022-02-25 DIAGNOSIS — L03115 Cellulitis of right lower limb: Secondary | ICD-10-CM

## 2022-02-25 NOTE — Telephone Encounter (Signed)
CALLED PATIENT'S DAUGHTER- [name] LEBOLD TO INFORM OF CT FOR 03-19-22- ARRIVAL TIME- 2 PM @ [facility], PATIENT TO HAVE WATER ONLY- 4 HRS. PRIOR TO TEST , PATIENT TO RECEIVE RESULTS FROM ALISON PERKINS ON 03-22-22 @ 2:30 PM VIA TELEPHONE SPOKE WITH PATIENT'S DAUGHTER- [name] LEBOLD AND SHE IS AWARE OF THESE APPTS. AND THE INSTRUCTIONS

## 2022-02-25 NOTE — Patient Instructions (Addendum)
Start torsemide, stop furosemide, get the echo done, keep using compression stockings and follow up with cardiology.   If you don't get a call next week about vascular follow up, then let us know.  Take care.  Glad to see you.

## 2022-02-25 NOTE — Progress Notes (Signed)
Plan for edema.  DC furosemide Start torsemide 20 mg daily Start K+ 10 mEq daily BMET 1 week Continue with compression stockings.  Obtain follow-up echocardiogram Follow-up 2 to 3 weeks  ======================== Discussed with patient about the previous plan established above. Hasn't made the med changes above.  Has been using compression stockings.  Finishing her abx today.  No fevers.  She is going to change from furosemide to torsemide in the near future.  She has not had significant improvement on antibiotics, she still has discoloration on the bilateral lower legs.  Daughter is on the phone during the office visit today.  Meds, vitals, and allergies reviewed.   ROS: Per HPI unless specifically indicated in ROS section   In wheelchair, O2 via nasal cannula, no apparent distress Ncat Neck supple, no LA Rrr Ctab Abd soft, not ttp L calf 36cm circumference.   R calf 44cm circumference.   Diffuse chronic skin changes on the bilateral shins/calves without ulceration or warmth.

## 2022-02-28 ENCOUNTER — Telehealth: Payer: Self-pay | Admitting: Family Medicine

## 2022-02-28 DIAGNOSIS — I739 Peripheral vascular disease, unspecified: Secondary | ICD-10-CM

## 2022-02-28 DIAGNOSIS — I8390 Asymptomatic varicose veins of unspecified lower extremity: Secondary | ICD-10-CM

## 2022-02-28 NOTE — Assessment & Plan Note (Signed)
It looks like she has a multifactorial chronic noninfectious cellulitis in both lower extremities that would not benefit from extra antibiotic treatment at this point.  Discussed options.  She has a referral to the vascular clinic.  I asked her to let us know if she does not get a call about an appointment in the near future. Start torsemide, stop furosemide, she will get the echo done, and keep using compression stockings in the meantime.  She is going to follow-up with cardiology.

## 2022-02-28 NOTE — Telephone Encounter (Signed)
Please check on her vascular clinic referral and see if there is any way to get her appointment moved up.  Thanks.

## 2022-03-03 ENCOUNTER — Telehealth: Payer: Self-pay | Admitting: Family Medicine

## 2022-03-03 NOTE — Telephone Encounter (Signed)
Noted referral updated

## 2022-03-03 NOTE — Telephone Encounter (Signed)
Clair Gulling, [place]  03/03/22 10:34 AM  Note Pt daughter Lavella Lemons called in stated she found a sooner appointment for pt with Vein and vascular  in [facility]  and would like the referral be sent in . Please advise # [phone]

## 2022-03-03 NOTE — Telephone Encounter (Signed)
[facility] offered appt 03/26/2022 but the appt was too early for the patient - they had to decline.  They rescheduled to 03/31/22 and added her to the cancellation list.   The daughter [name] is going to call the [place] office to see if able to get seen sooner. I offered to call for her but she requested to just call herself since she knows their schedule and the time the patient can and cannot go.   She will call us back if able to be seen sooner in [place]

## 2022-03-03 NOTE — Telephone Encounter (Signed)
I have reached out to [facility] to see if able to see this patient sooner than 04/08/2022 - this order/referral was not placed as Urgent/STAT, it was marked routine so they scheduled first available appt slot. They may have a cancellation list that she can be scheduled sooner from.   I am awaiting a message/call back

## 2022-03-03 NOTE — Telephone Encounter (Signed)
Pt daughter Lavella Lemons called in stated she found a sooner appointment for pt with Vein and vascular  in [facility]  and would like the referral be sent in . Please advise # [phone]

## 2022-03-04 ENCOUNTER — Emergency Department: Payer: Medicare Other

## 2022-03-04 ENCOUNTER — Emergency Department
Admission: EM | Admit: 2022-03-04 | Discharge: 2022-03-04 | Disposition: A | Discharge status: Home / Self Care | Payer: Medicare Other | Attending: Emergency Medicine | Admitting: Emergency Medicine

## 2022-03-04 ENCOUNTER — Encounter: Payer: Self-pay | Admitting: Emergency Medicine

## 2022-03-04 ENCOUNTER — Other Ambulatory Visit: Payer: Self-pay

## 2022-03-04 DIAGNOSIS — I509 Heart failure, unspecified: Secondary | ICD-10-CM | POA: Insufficient documentation

## 2022-03-04 DIAGNOSIS — I2510 Atherosclerotic heart disease of native coronary artery without angina pectoris: Secondary | ICD-10-CM | POA: Insufficient documentation

## 2022-03-04 DIAGNOSIS — Z1152 Encounter for screening for COVID-19: Secondary | ICD-10-CM | POA: Insufficient documentation

## 2022-03-04 DIAGNOSIS — I110 Hypertensive heart disease with heart failure: Secondary | ICD-10-CM | POA: Diagnosis not present

## 2022-03-04 DIAGNOSIS — J449 Chronic obstructive pulmonary disease, unspecified: Secondary | ICD-10-CM | POA: Diagnosis not present

## 2022-03-04 DIAGNOSIS — N179 Acute kidney failure, unspecified: Secondary | ICD-10-CM | POA: Diagnosis not present

## 2022-03-04 DIAGNOSIS — M545 Low back pain, unspecified: Secondary | ICD-10-CM | POA: Diagnosis not present

## 2022-03-04 DIAGNOSIS — Z20822 Contact with and (suspected) exposure to covid-19: Secondary | ICD-10-CM | POA: Insufficient documentation

## 2022-03-04 DIAGNOSIS — R0602 Shortness of breath: Secondary | ICD-10-CM | POA: Diagnosis not present

## 2022-03-04 LAB — COMPREHENSIVE METABOLIC PANEL
ALT: 15 U/L (ref 0–44)
AST: 17 U/L (ref 15–41)
Albumin: 3.6 g/dL (ref 3.5–5.0)
Alkaline Phosphatase: 82 U/L (ref 38–126)
Anion gap: 7 (ref 5–15)
BUN: 25 mg/dL — ABNORMAL HIGH (ref 8–23)
CO2: 32 mmol/L (ref 22–32)
Calcium: 8.8 mg/dL — ABNORMAL LOW (ref 8.9–10.3)
Chloride: 103 mmol/L (ref 98–111)
Creatinine, Ser: 1.05 mg/dL — ABNORMAL HIGH (ref 0.44–1.00)
GFR, Estimated: 52 mL/min — ABNORMAL LOW (ref >60)
Glucose, Bld: 113 mg/dL — ABNORMAL HIGH (ref 70–99)
Potassium: 4 mmol/L (ref 3.5–5.1)
Sodium: 142 mmol/L (ref 135–145)
Total Bilirubin: 0.4 mg/dL (ref 0.3–1.2)
Total Protein: 6.9 g/dL (ref 6.5–8.1)

## 2022-03-04 LAB — CBC
HCT: 33.4 % — ABNORMAL LOW (ref 36.0–46.0)
Hemoglobin: 10 g/dL — ABNORMAL LOW (ref 12.0–15.0)
MCH: 30.6 pg (ref 26.0–34.0)
MCHC: 29.9 g/dL — ABNORMAL LOW (ref 30.0–36.0)
MCV: 102.1 fL — ABNORMAL HIGH (ref 80.0–100.0)
Platelets: 196 10*3/uL (ref 150–400)
RBC: 3.27 MIL/uL — ABNORMAL LOW (ref 3.87–5.11)
RDW: 14.1 % (ref 11.5–15.5)
WBC: 4.7 10*3/uL (ref 4.0–10.5)
nRBC: 0 % (ref 0.0–0.2)

## 2022-03-04 LAB — RESP PANEL BY RT-PCR (RSV, FLU A&B, COVID)  RVPGX2
Influenza A by PCR: NEGATIVE
Influenza B by PCR: NEGATIVE
Resp Syncytial Virus by PCR: NEGATIVE
SARS Coronavirus 2 by RT PCR: NEGATIVE

## 2022-03-04 LAB — BRAIN NATRIURETIC PEPTIDE: B Natriuretic Peptide: 88.2 pg/mL (ref 0.0–100.0)

## 2022-03-04 LAB — TROPONIN I (HIGH SENSITIVITY): Troponin I (High Sensitivity): 9 ng/L (ref <18)

## 2022-03-04 MED ORDER — LIDOCAINE 5 % EX PTCH
1.0000 | MEDICATED_PATCH | Freq: Once | CUTANEOUS | Status: DC
  Administered 2022-03-04: 1 via TRANSDERMAL
  Filled 2022-03-04: qty 1

## 2022-03-04 MED ORDER — LIDOCAINE 5 % EX PTCH: 1.0000 | MEDICATED_PATCH | Freq: Two times a day (BID) | CUTANEOUS | 0 refills | Status: DC

## 2022-03-04 NOTE — ED Provider Notes (Signed)
[facility] Provider Note    Event Date/Time   First MD Initiated Contact with Patient 03/04/22 1845     (approximate)   History   Chief Complaint Back Pain and Leg Pain   HPI  Alexandria [name] is a 86 y.o. female with past medical history of hypertension, hyperlipidemia, CAD, CHF, COPD, and PAD who presents to the ED complaining of back pain.  Per daughter, patient has been complaining of increasing pain in her lower back over the past 2 days.  Patient reports achy pain coming across both sides of her lower back, but denies any recent falls or other trauma.  Patient has additionally been dealing with crampy pain in both of her legs, deals with chronic swelling that is no worse than usual.  She also has chronic skin changes and redness to both of her legs, daughter notes this is unchanged today.  Daughter is also concerned that patient has been taking too much diuretic medication as she was switched to torsemide from furosemide 5 days ago, but has continued taking both of these medications at the same time.  Patient denies any chest pain or difficulty breathing, has been dealing with a chronic cough.     Physical Exam   Triage Vital Signs: ED Triage Vitals  Enc Vitals Group     BP 03/04/22 1531 129/62     Pulse Rate 03/04/22 1531 75     Resp 03/04/22 1531 20     Temp 03/04/22 1531 97.8 F (36.6 C)     Temp Source 03/04/22 1531 Oral     SpO2 03/04/22 1531 96 %     Weight 03/04/22 1530 228 lb (103.4 kg)     Height 03/04/22 1530 5\' 5"  (1.651 m)     Head Circumference --      Peak Flow --      Pain Score 03/04/22 1530 3     Pain Loc --      Pain Edu? --      Excl. in [place]? --     Most recent vital signs: Vitals:   03/04/22 1531 03/04/22 1841  BP: 129/62 129/60  Pulse: 75 76  Resp: 20 20  Temp: 97.8 F (36.6 C) 98 F (36.7 C)  SpO2: 96% 96%    Constitutional: Alert and oriented. Eyes: Conjunctivae are normal. Head: Atraumatic. Nose: No  congestion/rhinnorhea. Mouth/Throat: Mucous membranes are moist.  Cardiovascular: Normal rate, regular rhythm. Grossly normal heart sounds.  2+ radial and DP pulses bilaterally. Respiratory: Normal respiratory effort.  No retractions. Lungs CTAB. Gastrointestinal: Soft and nontender. No distention. Musculoskeletal: 1+ pitting edema to knees bilaterally with erythema but no warmth or tenderness.  No midline thoracic or lumbar spinal tenderness to palpation, lumbar paraspinal tenderness noted. Neurologic:  Normal speech and language. No gross focal neurologic deficits are appreciated.    ED Results / Procedures / Treatments   Labs (all labs ordered are listed, but only abnormal results are displayed) Labs Reviewed  CBC - Abnormal; Notable for the following components:      Result Value   RBC 3.27 (*)    Hemoglobin 10.0 (*)    HCT 33.4 (*)    MCV 102.1 (*)    MCHC 29.9 (*)    All other components within normal limits  COMPREHENSIVE METABOLIC PANEL - Abnormal; Notable for the following components:   Glucose, Bld 113 (*)    BUN 25 (*)    Creatinine, Ser 1.05 (*)  Calcium 8.8 (*)    GFR, Estimated 52 (*)    All other components within normal limits  RESP PANEL BY RT-PCR (RSV, FLU A&B, COVID)  RVPGX2  BRAIN NATRIURETIC PEPTIDE  TROPONIN I (HIGH SENSITIVITY)   RADIOLOGY Chest x-ray reviewed and interpreted by me with no infiltrate, edema, or effusion.  PROCEDURES:  Critical Care performed: No  Procedures   MEDICATIONS ORDERED IN ED: Medications  lidocaine (LIDODERM) 5 % 1 patch (1 patch Transdermal Patch Applied 03/04/22 1933)     IMPRESSION / MDM / ASSESSMENT AND PLAN / ED COURSE  I reviewed the triage vital signs and the nursing notes.                              86 y.o. female with past medical history of hypertension, hyperlipidemia, CAD, CHF, COPD, and PAD who presents to the ED complaining of 2 days of bilateral lower back pain as well as cramping in her  legs.  Patient's presentation is most consistent with acute presentation with potential threat to life or bodily function.  Differential diagnosis includes, but is not limited to, AKI, electrolyte abnormality, venous insufficiency, cellulitis, lumbar strain, cauda equina, lumbar radiculopathy.  Patient well-appearing and in no acute distress, vital signs are unremarkable and she is neurovascular intact to her bilateral lower extremities.  Patient with no recent trauma to her back and has no midline tenderness, no findings on exam to raise suspicion for cauda equina.  Suspect lumbar strain and we will treat with Lidoderm patch, do not feel x-rays are indicated at this time.  She has chronic redness and swelling to her lower extremities, no worse than usual today and no findings concerning for DVT or cellulitis.  Exam consistent with venous insufficiency, patient has compression stockings available at home.  She does have a very mild AKI, likely related to doubling up on her diuretic.  Daughter is aware of this and we will go back to torsemide only.  Chest x-ray is unremarkable and remainder of labs are reassuring with no significant anemia, leukocytosis, or electrolyte abnormality.  Troponin and BNP are within normal limits.  Patient is appropriate for discharge home with PCP follow-up for recheck of kidney function, daughter counseled to have a return to the ED for new or worsening symptoms.  Daughter agrees with plan.      FINAL CLINICAL IMPRESSION(S) / ED DIAGNOSES   Final diagnoses:  Acute bilateral low back pain without sciatica  AKI (acute kidney injury) ([place])     Rx / DC Orders   ED Discharge Orders          Ordered    lidocaine (LIDODERM) 5 %  Every 12 hours        03/04/22 1925             Note:  This document was prepared using Dragon voice recognition software and may include unintentional dictation errors.   Blake Divine, MD 03/04/22 [phone]

## 2022-03-04 NOTE — ED Triage Notes (Signed)
Patient arrives in wheelchair by POV c/o leg cramps and back pain since Sunday. Patient reports medications adjusted and increased her lasix.   Patients daughter called RN stating the patient has been accidentally taking torsemide and lasix. Patients daughter also adds that patient has been coughing since Saturday.

## 2022-03-04 NOTE — ED Triage Notes (Signed)
First Nurse Note:  C/O generalized cramps and back pain.  States she believes that she is on too much lasix.  Also c/o leg swelling.  AAOx3.  Skin warm and dry. NAD

## 2022-03-04 NOTE — Telephone Encounter (Signed)
She was supposed to have a f/u BMET 1 week after starting Torsemide. I do not see that she had this done. Can we get her in for a BMET today or tomorrow? Richardson Dopp, PA-C    03/04/2022 11:56 AM

## 2022-03-04 NOTE — Addendum Note (Signed)
Addended by: Tonia Ghent on: 03/04/2022 02:30 PM   Modules accepted: Orders

## 2022-03-04 NOTE — Telephone Encounter (Signed)
Referral sent to [facility] - they will review and contact the patient to schedule

## 2022-03-04 NOTE — ED Provider Triage Note (Signed)
Emergency Medicine Provider Triage Evaluation Note  Alexandria [name], a 86 y.o. female  was evaluated in triage.  Pt complains of bilateral lower extremity edema, leg cramps, and back pain since Sunday.  Patient has had recent increase in her Lasix dosing.  The daughter who is also managing the patient medicines, reports she had incidentally been giving her furosemide as well as Lasix.  Patient also endorses a cough since today.  She presents to the ED with complaints of generalized cramps, increased weight, and some pain in the ribs.  Patient is on O2 per nasal cannula chronically.  Review of Systems  Positive: BLE edema, rib pain Negative: FCS  Physical Exam  BP 129/62   Pulse 75   Temp 97.8 F (36.6 C) (Oral)   Resp 20   Ht 5\' 5"  (1.651 m)   Wt 103.4 kg   LMP  (LMP Unknown)   SpO2 96%   BMI 37.94 kg/m  Gen:   Awake, no distress  NAD Resp:  Normal effort  MSK:   Moves extremities without difficulty  Other:    Medical Decision Making  Medically screening exam initiated at 3:49 PM.  Appropriate orders placed.  DENITA LUN was informed that the remainder of the evaluation will be completed by another provider, this initial triage assessment does not replace that evaluation, and the importance of remaining in the ED until their evaluation is complete.  Geriatric patient with a history of cellulitis, PAD, CHF, and diabetes, presents to the ED for bilateral lower extremity edema, cough, and concern for fluid overload or abnormal potassium.   Melvenia Needles, PA-C 03/04/22 1552

## 2022-03-04 NOTE — Telephone Encounter (Signed)
I put in the referral.  Thanks.  

## 2022-03-05 ENCOUNTER — Telehealth: Payer: Self-pay

## 2022-03-05 ENCOUNTER — Other Ambulatory Visit: Payer: Self-pay | Admitting: *Deleted

## 2022-03-05 DIAGNOSIS — I739 Peripheral vascular disease, unspecified: Secondary | ICD-10-CM

## 2022-03-05 NOTE — Telephone Encounter (Signed)
Any recommendations for muscle cramps.

## 2022-03-05 NOTE — Telephone Encounter (Signed)
Transition Care Management Follow-up Telephone Call Date of discharge and from where: [facility] 03/04/2022 How have you been since you were released from the hospital? better Any questions or concerns? No  Items Reviewed: Did the pt receive and understand the discharge instructions provided? Yes  Medications obtained and verified? Yes  Other? No  Any new allergies since your discharge? No  Dietary orders reviewed? Yes Do you have support at home? Yes   Home Care and Equipment/Supplies: Were home health services ordered? no If so, what is the name of the agency? N/a  Has the agency set up a time to come to the patient's home? not applicable Were any new equipment or medical supplies ordered?  No What is the name of the medical supply agency? N/a Were you able to get the supplies/equipment? no Do you have any questions related to the use of the equipment or supplies? NO  Functional Questionnaire: (I = Independent and D = Dependent) ADLs: I  Bathing/Dressing- I  Meal Prep- I  Eating- I  Maintaining continence- I  Transferring/Ambulation- I  Managing Meds- I  Follow up appointments reviewed:  PCP Hospital f/u appt confirmed? Yes  Scheduled to see [name] on 03/12/2022 @ 2:00. [facility] f/u appt confirmed? No   Are transportation arrangements needed? No  If their condition worsens, is the pt aware to call PCP or go to the Emergency Dept.? Yes Was the patient provided with contact information for the PCP's office or ED? Yes Was to pt encouraged to call back with questions or concerns? Yes  Juanda Crumble, LPN [place] Direct Dial [phone]

## 2022-03-08 NOTE — Progress Notes (Unsigned)
Requested by:  Tonia Ghent, MD [address] [place],  [place] [address]  Reason for consultation: bilateral lower extremity erythema and swelling    History of Present Illness   Alexandria [name] is a 87 y.o. (1935/12/18) female who presents for evaluation of bilateral lower extremity erythema and swelling. The swelling is worse in the right than the left. She states she has had lower extremity swelling for the majority of 2023 that waxes and wanes in severity. At times her legs will get more red. Her PCP has placed her on multiple courses of antibiotics with concern for cellulitis, however the patient denies that the medications helped her. She does not know what makes the swelling worse or better. She endorses aching pains and occasional weeping. She denies any ulcerations or history of DVT.  She has also been on Lasix for about 2 weeks and does not think that it helps her leg swelling. She wears knee high compression stockings and thinks that they help.  Past Medical History:  Diagnosis Date   Allergy, unspecified not elsewhere classified    Anxiety    Cataract    Chronic diastolic congestive heart failure (HCC)    COPD (chronic obstructive pulmonary disease) ([place]) 03/08/1998   PFTs 12/12/2002 FEV 1 1.42 (64%) ratio 58 with no better after B2 and DLCO75%; PFTs 11/20/09 FEV1 1.50 (73%) ratio 50 no better after B2 with DLCO 62%; Hfa 50% 11/20/2009 >75%, 01/13/10 p coaching   Depression 12/07/1998   Diabetes mellitus type II 02/05/2006   Dr. Cruzita Lederer with endo   Disorders of bursae and tendons in shoulder region, unspecified    Rotator cuff syndrome, right   E. coli bacteremia    Esophagitis    GERD (gastroesophageal reflux disease)    History of UTI    Hyperlipidemia 10/04/2000   Hypertension 03/08/1992   Hypothyroidism 03/08/1968   Iron deficiency anemia    Lung nodule    radiation starts 10-08-2019   Malignant neoplasm of right upper lobe of lung ([place]) 07/24/2019    Obesity    NOS   OSA (obstructive sleep apnea)    PSG 01/27/10 AHI 13, pt does not know CPAP settings   Peripheral neuropathy    Likely due to DM per Dr. Murvin Natal hernia     Past Surgical History:  Procedure Laterality Date   ABD U/S  03/19/1999   Nml x2 foci in liver   ADENOSINE MYOVIEW  06/02/2007   Nml   CARDIOLITE PERSANTINE  08/24/2000   Nml   CAROTID U/S  08/24/2000   1-39% ICA stenosis   CAROTID U/S  06/02/2007   No apprec change    CARPAL TUNNEL RELEASE  12/1997   Right   CESAREAN SECTION     x2 [place]/ repeat   CHOLECYSTECTOMY  1997   COLONOSCOPY WITH PROPOFOL N/A 02/12/2016   Procedure: COLONOSCOPY WITH PROPOFOL;  Surgeon: Milus Banister, MD;  Location: [facility];  Service: Endoscopy;  Laterality: N/A;   CT ABD W & PELVIS WO/W CM     Abd hemangiomas of liver, 1 cm R renal cyst   DENTAL SURGERY  2016   Implants   DEXA  07/03/2003   Nml   ESOPHAGOGASTRODUODENOSCOPY  12/05/1997   Nml (due to hoarseness)   ESOPHAGOGASTRODUODENOSCOPY (EGD) WITH PROPOFOL N/A 02/12/2016   Procedure: ESOPHAGOGASTRODUODENOSCOPY (EGD) WITH PROPOFOL;  Surgeon: Milus Banister, MD;  Location: [facility];  Service: Endoscopy;  Laterality: N/A;  GALLBLADDER SURGERY     HERNIA REPAIR  01/24/2009   Lap Ventr w/ Lysis of adhesions (Dr. Donne Hazel)   knee arthroscopic surgery  years ago   right   ROTATOR CUFF REPAIR  1984   Right, [place]   SHOULDER OPEN ROTATOR CUFF REPAIR  02/08/2012   Procedure: ROTATOR CUFF REPAIR SHOULDER OPEN;  Surgeon: Magnus Sinning, MD;  Location: [facility];  Service: Orthopedics;  Laterality: Left;  Left Shoulder Open Anterior Acrominectomy Rotator Cuff Repair Open Distal Clavicle Resection ,tissue mend graft, and repair of biceps tendon   THUMB RELEASE  12/1997   Right   TONSILLECTOMY     TOTAL ABDOMINAL HYSTERECTOMY  1985   Due to dysmennorhea   US ECHOCARDIOGRAPHY  06/02/2007    Social History   Socioeconomic History   Marital status: Widowed     Spouse name: Not on file   Number of children: 2   Years of education: Not on file   Highest education level: Not on file  Occupational History   Occupation: Retired    Fish farm manager: RETIRED  Tobacco Use   Smoking status: Former    Packs/day: 2.00    Years: 50.00    Total pack years: 100.00    Types: Cigarettes    Quit date: 02/05/2005    Years since quitting: 17.0   Smokeless tobacco: Never  Vaping Use   Vaping Use: Never used  Substance and Sexual Activity   Alcohol use: Yes    Comment: occasionally   Drug use: Never   Sexual activity: Not Currently  Other Topics Concern   Not on file  Social History Narrative   Widowed 2023, 2 children; Enjoys painting      Quit smoking in 2007; no alcohol; lives in [place]; [name]/d lives in [place]. Had own business. Dont drive- sec to neuropathy.    Social Determinants of Health   Financial Resource Strain: Not on file  Food Insecurity: Not on file  Transportation Needs: Not on file  Physical Activity: Not on file  Stress: Not on file  Social Connections: Not on file  Intimate Partner Violence: Not on file    Family History  Problem Relation Age of Onset   Heart disease Mother    Thyroid disease Mother    Emphysema Father        One lung   Cystic fibrosis Sister    Hyperthyroidism Sister    Osteoarthritis Brother    Hyperthyroidism Brother    Esophageal cancer Neg Hx    Cancer Neg Hx        Head or neck   Colon cancer Neg Hx    Stomach cancer Neg Hx    Breast cancer Neg Hx     Current Outpatient Medications  Medication Sig Dispense Refill   albuterol (PROAIR HFA) 108 (90 Base) MCG/ACT inhaler Inhale 2 puffs into the lungs every 6 (six) hours as needed for wheezing or shortness of breath. INHALE 2 PUFFS INTO THE LUNGS EVERY 6 HOURS AS NEEDED FOR WHEEZING OR SHORTNESS OF BREATH 3 Inhaler 3   arformoterol (BROVANA) 15 MCG/2ML NEBU Take 2 mLs (15 mcg total) by nebulization at bedtime. 120 mL 6   budesonide  (PULMICORT) 0.5 MG/2ML nebulizer solution Take 2 mLs (0.5 mg total) by nebulization in the morning and at bedtime. 360 mL 3   busPIRone (BUSPAR) 15 MG tablet Take 1 tablet (15 mg total) by mouth 2 (two) times daily. With extra 1/2 tab midday     Cholecalciferol (  VITAMIN D) 50 MCG (2000 UT) tablet Take 1 tablet (2,000 Units total) by mouth daily.     Coenzyme Q-10 200 MG CAPS Take 200 mg by mouth daily.     Cyanocobalamin (B-12) 5000 MCG CAPS Take 5,000 mcg by mouth daily.     cyclobenzaprine (FLEXERIL) 10 MG tablet Take 1 tablet (10 mg total) by mouth at bedtime. 10 tablet 0   esomeprazole (NEXIUM) 40 MG capsule Take 1 capsule (40 mg total) by mouth daily at 12 noon. 30 capsule 11   ezetimibe (ZETIA) 10 MG tablet TAKE 1 TABLET BY MOUTH AT BEDTIME 90 tablet 2   fexofenadine (ALLEGRA) 180 MG tablet Take 180 mg by mouth as needed.      FLUoxetine (PROZAC) 20 MG tablet Take 20 mg by mouth 2 (two) times daily.     fluticasone (FLONASE) 50 MCG/ACT nasal spray USE 2 SPRAYS INTO THE NOSE EVERY 12 HOURS AS NEEDED FOR STUFFY NOSE 48 mL 2   glucose blood (ONETOUCH VERIO) test strip USE AS INSTRUCTED TO CHECK SUGAR 1 TIME DAILY 100 strip 8   guaiFENesin (MUCINEX) 600 MG 12 hr tablet Take 2 tablets (1,200 mg total) by mouth 2 (two) times daily as needed for cough or to loosen phlegm.     Iron, Ferrous Sulfate, 325 (65 Fe) MG TABS Take 325 mg by mouth daily. 90 tablet 3   isosorbide mononitrate (IMDUR) 30 MG 24 hr tablet Take 1 tablet (30 mg total) by mouth daily. 90 tablet 3   levothyroxine (SYNTHROID) 125 MCG tablet Take 1 tablet (125 mcg total) by mouth daily. 90 tablet 3   lidocaine (LIDODERM) 5 % Place 1 patch onto the skin every 12 (twelve) hours. Remove & Discard patch within 12 hours or as directed by MD 10 patch 0   metFORMIN (GLUCOPHAGE) 500 MG tablet Take 1 tablet (500 mg total) by mouth every evening. 90 tablet 3   nitroGLYCERIN (NITROSTAT) 0.4 MG SL tablet Place 1 tablet (0.4 mg total) under the  tongue every 5 (five) minutes as needed for chest pain. 25 tablet 3   ONETOUCH DELICA LANCETS FINE MISC USE TO CHECK SUGAR 1 TIME DAILY 100 each 5   potassium chloride (KLOR-CON) 10 MEQ tablet Take 1 tablet (10 mEq total) by mouth daily. 90 tablet 3   rosuvastatin (CRESTOR) 10 MG tablet Take 1 tablet (10 mg total) by mouth every other day. 45 tablet 2   Spacer/Aero Chamber Mouthpiece MISC Use with  inhaler as needed.  J44.9 1 each 0   torsemide (DEMADEX) 20 MG tablet Take 1 tablet (20 mg total) by mouth daily. 90 tablet 1   No current facility-administered medications for this visit.    Allergies  Allergen Reactions   Antihistamines, Diphenhydramine-Type Other (See Comments)    Able to tolerate only allegra.    Sulfonamide Derivatives Swelling    REACTION: closed throat   Incruse Ellipta [Umeclidinium Bromide] Cough    Caused voice change and severe coughing   Clarithromycin Other (See Comments)    REACTION: diff swallowing and mouth blisters   Codeine Other (See Comments)    Unknown    Lyrica [Pregabalin] Other (See Comments)    Lack of effect for neuropathy pain.     Spiriva Handihaler [Tiotropium Bromide Monohydrate] Other (See Comments)    Voice changes   Vraylar [Cariprazine]     intolerant    REVIEW OF SYSTEMS (negative unless checked):   Cardiac:  []  Chest pain or chest pressure? []   Shortness of breath upon activity? []  Shortness of breath when lying flat? []  Irregular heart rhythm?  Vascular:  []  Pain in calf, thigh, or hip brought on by walking? []  Pain in feet at night that wakes you up from your sleep? []  Blood clot in your veins? [x]  Leg swelling?  Pulmonary:  []  Oxygen at home? []  Productive cough? []  Wheezing?  Neurologic:  []  Sudden weakness in arms or legs? []  Sudden numbness in arms or legs? []  Sudden onset of difficult speaking or slurred speech? []  Temporary loss of vision in one eye? []  Problems with dizziness?  Gastrointestinal:  []  Blood  in stool? []  Vomited blood?  Genitourinary:  []  Burning when urinating? []  Blood in urine?  Psychiatric:  []  Major depression  Hematologic:  []  Bleeding problems? []  Problems with blood clotting?  Dermatologic:  []  Rashes or ulcers?  Constitutional:  []  Fever or chills?  Ear/Nose/Throat:  []  Change in hearing? []  Nose bleeds? []  Sore throat?  Musculoskeletal:  []  Back pain? []  Joint pain? []  Muscle pain?   Physical Examination     Vitals:   03/09/22 1250  BP: (!) 116/58  Pulse: 64  Resp: 20  Temp: 97.9 F (36.6 C)  SpO2: 94%  Weight: 228 lb (103.4 kg)  Height: 5\' 5"  (1.651 m)   Body mass index is 37.94 kg/m.  General:  WDWN in NAD; vital signs documented above Gait: Not observed HENT: WNL, normocephalic Pulmonary: nonlabored breathing, on supplemental O2 Cardiac: NSR Abdomen: soft, NT, no masses Skin: without rashes Vascular Exam/Pulses: Bilateral feet warm and well perfused Extremities: with varicose veins, with reticular veins, with edema R >L, with stasis pigmentation, without lipodermatosclerosis, without ulcers Musculoskeletal: no muscle wasting or atrophy  Neurologic: A&O X 3;  No focal weakness or paresthesias are detected Psychiatric:  The pt has Normal affect.  Non-invasive Vascular Imaging   LLE Venous Insufficiency Duplex (03/09/2022):   Thrombus  - No evidence of deep vein thrombosis seen in the right lower extremity,  from the common femoral through the popliteal veins.  - No evidence of superficial venous thrombosis in the right lower  extremity.   Deep veins  - Reflux in the CFV and proximal FV.   Superficial veins  - Reflux in the SFJ and proximal thigh GSV   Interstitial fluid in the calf.   Medical Decision Making   Alexandria [name] is a 87 y.o. female who presents with lower extremity swelling, R >L  Based on the patient's duplex study, there is reflux in the right CFV, FV, SFJ, and proximal GSV. There is no acute or  chronic DVT The patient does have evidence of chronic venous insufficiency with possible venous stasis dermatitis. The right leg is significantly worse than the left. There is erythema in bilateral lower extremities. I discussed with the patient she could be a candidate for GSV treament. She will be measured for and wear thigh high compression stockings, as well as attempt elevation and compression for 3 months The patient will follow up in 3 months with one of our MDs   Gerri Lins, PA-C [facility]: [phone]  03/09/2022, 12:52 PM  Clinic MD: Carlis Abbott

## 2022-03-09 ENCOUNTER — Other Ambulatory Visit: Payer: Self-pay

## 2022-03-09 ENCOUNTER — Ambulatory Visit (HOSPITAL_COMMUNITY)
Admission: RE | Admit: 2022-03-09 | Discharge: 2022-03-09 | Disposition: A | Discharge status: Home / Self Care | Payer: Medicare Other | Source: Ambulatory Visit | Attending: Vascular Surgery | Admitting: Vascular Surgery

## 2022-03-09 ENCOUNTER — Ambulatory Visit: Payer: Medicare Other

## 2022-03-09 ENCOUNTER — Ambulatory Visit (INDEPENDENT_AMBULATORY_CARE_PROVIDER_SITE_OTHER): Payer: Medicare Other | Admitting: Physician Assistant

## 2022-03-09 VITALS — BP 116/58 | HR 64 | Temp 97.9°F | Resp 20 | Ht 65.0 in | Wt 228.0 lb

## 2022-03-09 DIAGNOSIS — I739 Peripheral vascular disease, unspecified: Secondary | ICD-10-CM | POA: Diagnosis not present

## 2022-03-09 DIAGNOSIS — I872 Venous insufficiency (chronic) (peripheral): Secondary | ICD-10-CM | POA: Diagnosis not present

## 2022-03-09 DIAGNOSIS — M7989 Other specified soft tissue disorders: Secondary | ICD-10-CM

## 2022-03-09 DIAGNOSIS — D500 Iron deficiency anemia secondary to blood loss (chronic): Secondary | ICD-10-CM

## 2022-03-09 DIAGNOSIS — D649 Anemia, unspecified: Secondary | ICD-10-CM

## 2022-03-10 ENCOUNTER — Inpatient Hospital Stay (HOSPITAL_BASED_OUTPATIENT_CLINIC_OR_DEPARTMENT_OTHER): Payer: Medicare Other | Admitting: Internal Medicine

## 2022-03-10 ENCOUNTER — Encounter: Payer: Self-pay | Admitting: Internal Medicine

## 2022-03-10 ENCOUNTER — Inpatient Hospital Stay: Payer: Medicare Other | Attending: Internal Medicine

## 2022-03-10 ENCOUNTER — Inpatient Hospital Stay: Payer: Medicare Other

## 2022-03-10 VITALS — BP 123/71 | HR 78 | Temp 97.7°F | Resp 18

## 2022-03-10 DIAGNOSIS — Z9981 Dependence on supplemental oxygen: Secondary | ICD-10-CM | POA: Diagnosis not present

## 2022-03-10 DIAGNOSIS — Z79899 Other long term (current) drug therapy: Secondary | ICD-10-CM | POA: Insufficient documentation

## 2022-03-10 DIAGNOSIS — D649 Anemia, unspecified: Secondary | ICD-10-CM

## 2022-03-10 DIAGNOSIS — Z85118 Personal history of other malignant neoplasm of bronchus and lung: Secondary | ICD-10-CM | POA: Diagnosis not present

## 2022-03-10 DIAGNOSIS — M549 Dorsalgia, unspecified: Secondary | ICD-10-CM | POA: Insufficient documentation

## 2022-03-10 DIAGNOSIS — G629 Polyneuropathy, unspecified: Secondary | ICD-10-CM | POA: Insufficient documentation

## 2022-03-10 DIAGNOSIS — J449 Chronic obstructive pulmonary disease, unspecified: Secondary | ICD-10-CM | POA: Insufficient documentation

## 2022-03-10 DIAGNOSIS — M7989 Other specified soft tissue disorders: Secondary | ICD-10-CM | POA: Insufficient documentation

## 2022-03-10 DIAGNOSIS — R5383 Other fatigue: Secondary | ICD-10-CM | POA: Insufficient documentation

## 2022-03-10 DIAGNOSIS — Z87891 Personal history of nicotine dependence: Secondary | ICD-10-CM | POA: Diagnosis not present

## 2022-03-10 DIAGNOSIS — I5032 Chronic diastolic (congestive) heart failure: Secondary | ICD-10-CM | POA: Insufficient documentation

## 2022-03-10 DIAGNOSIS — D509 Iron deficiency anemia, unspecified: Secondary | ICD-10-CM | POA: Diagnosis not present

## 2022-03-10 DIAGNOSIS — L03115 Cellulitis of right lower limb: Secondary | ICD-10-CM | POA: Diagnosis not present

## 2022-03-10 DIAGNOSIS — D500 Iron deficiency anemia secondary to blood loss (chronic): Secondary | ICD-10-CM

## 2022-03-10 LAB — CBC WITH DIFFERENTIAL/PLATELET
Abs Immature Granulocytes: 0.03 10*3/uL (ref 0.00–0.07)
Basophils Absolute: 0 10*3/uL (ref 0.0–0.1)
Basophils Relative: 0 %
Eosinophils Absolute: 0.1 10*3/uL (ref 0.0–0.5)
Eosinophils Relative: 1 %
HCT: 32.2 % — ABNORMAL LOW (ref 36.0–46.0)
Hemoglobin: 9.9 g/dL — ABNORMAL LOW (ref 12.0–15.0)
Immature Granulocytes: 1 %
Lymphocytes Relative: 22 %
Lymphs Abs: 1.3 10*3/uL (ref 0.7–4.0)
MCH: 30.3 pg (ref 26.0–34.0)
MCHC: 30.7 g/dL (ref 30.0–36.0)
MCV: 98.5 fL (ref 80.0–100.0)
Monocytes Absolute: 0.5 10*3/uL (ref 0.1–1.0)
Monocytes Relative: 8 %
Neutro Abs: 4.1 10*3/uL (ref 1.7–7.7)
Neutrophils Relative %: 68 %
Platelets: 180 10*3/uL (ref 150–400)
RBC: 3.27 MIL/uL — ABNORMAL LOW (ref 3.87–5.11)
RDW: 13.8 % (ref 11.5–15.5)
WBC: 6 10*3/uL (ref 4.0–10.5)
nRBC: 0 % (ref 0.0–0.2)

## 2022-03-10 LAB — IRON AND TIBC
Iron: 61 ug/dL (ref 28–170)
Saturation Ratios: 17 % (ref 10.4–31.8)
TIBC: 360 ug/dL (ref 250–450)
UIBC: 299 ug/dL

## 2022-03-10 LAB — COMPREHENSIVE METABOLIC PANEL
ALT: 12 U/L (ref 0–44)
AST: 17 U/L (ref 15–41)
Albumin: 3.7 g/dL (ref 3.5–5.0)
Alkaline Phosphatase: 84 U/L (ref 38–126)
Anion gap: 9 (ref 5–15)
BUN: 34 mg/dL — ABNORMAL HIGH (ref 8–23)
CO2: 36 mmol/L — ABNORMAL HIGH (ref 22–32)
Calcium: 8.6 mg/dL — ABNORMAL LOW (ref 8.9–10.3)
Chloride: 96 mmol/L — ABNORMAL LOW (ref 98–111)
Creatinine, Ser: 1.36 mg/dL — ABNORMAL HIGH (ref 0.44–1.00)
GFR, Estimated: 38 mL/min — ABNORMAL LOW (ref >60)
Glucose, Bld: 137 mg/dL — ABNORMAL HIGH (ref 70–99)
Potassium: 4.1 mmol/L (ref 3.5–5.1)
Sodium: 141 mmol/L (ref 135–145)
Total Bilirubin: 0.4 mg/dL (ref 0.3–1.2)
Total Protein: 7.2 g/dL (ref 6.5–8.1)

## 2022-03-10 LAB — RETIC PANEL
Immature Retic Fract: 19 % — ABNORMAL HIGH (ref 2.3–15.9)
RBC.: 3.21 MIL/uL — ABNORMAL LOW (ref 3.87–5.11)
Retic Count, Absolute: 132.9 10*3/uL (ref 19.0–186.0)
Retic Ct Pct: 4.1 % — ABNORMAL HIGH (ref 0.4–3.1)
Reticulocyte Hemoglobin: 31.5 pg (ref >27.9)

## 2022-03-10 LAB — FERRITIN: Ferritin: 101 ng/mL (ref 11–307)

## 2022-03-10 MED ORDER — METHYLPREDNISOLONE SODIUM SUCC 125 MG IJ SOLR
40.0000 mg | Freq: Once | INTRAMUSCULAR | Status: AC
  Administered 2022-03-10: 40 mg via INTRAVENOUS
  Filled 2022-03-10: qty 2

## 2022-03-10 MED ORDER — METHYLPREDNISOLONE SODIUM SUCC 125 MG IJ SOLR
INTRAMUSCULAR | Status: AC
  Filled 2022-03-10: qty 2

## 2022-03-10 MED ORDER — SODIUM CHLORIDE 0.9 % IV SOLN
200.0000 mg | Freq: Once | INTRAVENOUS | Status: AC
  Administered 2022-03-10: 200 mg via INTRAVENOUS
  Filled 2022-03-10: qty 200

## 2022-03-10 MED ORDER — SODIUM CHLORIDE 0.9 % IV SOLN
Freq: Once | INTRAVENOUS | Status: AC
  Filled 2022-03-10: qty 250

## 2022-03-10 NOTE — Progress Notes (Signed)
[place] NOTE  Patient Care Team: Tonia Ghent, MD as PCP - General Rockey Situ, Kathlene November, MD as Consulting Physician (Cardiology) [name], NP as Nurse Practitioner (Adult Health Nurse Practitioner) Cammie Sickle, MD as Consulting Physician (Oncology)  CHIEF COMPLAINTS/PURPOSE OF CONSULTATION: ANEMIA  HEMATOLOGY HISTORY:  # CHRONIC INTERMITTENT ANEMIA  [since 2010]- Hb SEP 2022- 8; previously on PO Iron- ; WBC/platelets- Normal; MCV- 90s. EGD- > 15 years; /Colonoscopy:4 years- [Dr.Jacob;; in [place]] capsule-? Bone marrow Biopsy-none; OCT 2022- SEVERE IRON DEFICIENCY; myeloma work-up/hemolysis work-up negative; JAN 2023-CT scan duodenal thickening-recommend GI evaluation  #   AUG 2021- RIGHT LUNG stage I non-small cell lung cancer-  s/p SBRT   [Dr.Moody; [place]]; NOV 2022- CT chest-no recurrent disease.  # COPD on 2.5 lit/day [Dr.Sood]; diastolic CHF/ OCT 1194-RDE BP [needing to come off losartan- Dr.Gollan]; peripheral neuropathy.  HISTORY OF PRESENTING ILLNESS: Alone; 2.5 Lit/ O2 24 x7. In wheel chair.  Ice book to the patient's daughter over the phone  Alexandria [name] 87 y.o.  female with chronic respiratory failure on oxygen and severe anemia secondary to iron deficiency of unclear etiology s/p venofer is here for follow-up.  Patient feeling positive and improved energy today. She continues to deny any blood in stools or black-colored stools.    Patient complains of back pain.  Chronic recently worse.   Also given recent leg swelling-started on diuretics.  Review of Systems  Constitutional:  Positive for malaise/fatigue. Negative for chills, diaphoresis, fever and weight loss.  HENT:  Negative for nosebleeds and sore throat.   Eyes:  Negative for double vision.  Respiratory:  Positive for cough and shortness of breath. Negative for hemoptysis, sputum production and wheezing.   Cardiovascular:  Negative for chest pain, palpitations,  orthopnea and leg swelling.  Gastrointestinal:  Negative for abdominal pain, blood in stool, constipation, diarrhea, heartburn, melena, nausea and vomiting.  Genitourinary:  Negative for dysuria, frequency and urgency.  Musculoskeletal:  Negative for back pain and joint pain.  Skin: Negative.  Negative for itching and rash.  Neurological:  Positive for dizziness. Negative for tingling, focal weakness, weakness and headaches.  Endo/Heme/Allergies:  Does not bruise/bleed easily.  Psychiatric/Behavioral:  Negative for depression. The patient is not nervous/anxious and does not have insomnia.     MEDICAL HISTORY:  Past Medical History:  Diagnosis Date   Allergy, unspecified not elsewhere classified    Anxiety    Cataract    Chronic diastolic congestive heart failure (HCC)    COPD (chronic obstructive pulmonary disease) ([place]) 03/08/1998   PFTs 12/12/2002 FEV 1 1.42 (64%) ratio 58 with no better after B2 and DLCO75%; PFTs 11/20/09 FEV1 1.50 (73%) ratio 50 no better after B2 with DLCO 62%; Hfa 50% 11/20/2009 >75%, 01/13/10 p coaching   Depression 12/07/1998   Diabetes mellitus type II 02/05/2006   Dr. Cruzita Lederer with endo   Disorders of bursae and tendons in shoulder region, unspecified    Rotator cuff syndrome, right   E. coli bacteremia    Esophagitis    GERD (gastroesophageal reflux disease)    History of UTI    Hyperlipidemia 10/04/2000   Hypertension 03/08/1992   Hypothyroidism 03/08/1968   Iron deficiency anemia    Lung nodule    radiation starts 10-08-2019   Malignant neoplasm of right upper lobe of lung ([place]) 07/24/2019   Obesity    NOS   OSA (obstructive sleep apnea)    PSG 01/27/10 AHI 13, pt does not know CPAP  settings   Peripheral neuropathy    Likely due to DM per Dr. Murvin Natal hernia     SURGICAL HISTORY: Past Surgical History:  Procedure Laterality Date   ABD U/S  03/19/1999   Nml x2 foci in liver   ADENOSINE MYOVIEW  06/02/2007   Nml   CARDIOLITE PERSANTINE   08/24/2000   Nml   CAROTID U/S  08/24/2000   1-39% ICA stenosis   CAROTID U/S  06/02/2007   No apprec change    CARPAL TUNNEL RELEASE  12/1997   Right   CESAREAN SECTION     x2 [place]/ repeat   CHOLECYSTECTOMY  1997   COLONOSCOPY WITH PROPOFOL N/A 02/12/2016   Procedure: COLONOSCOPY WITH PROPOFOL;  Surgeon: Milus Banister, MD;  Location: [facility];  Service: Endoscopy;  Laterality: N/A;   CT ABD W & PELVIS WO/W CM     Abd hemangiomas of liver, 1 cm R renal cyst   DENTAL SURGERY  2016   Implants   DEXA  07/03/2003   Nml   ESOPHAGOGASTRODUODENOSCOPY  12/05/1997   Nml (due to hoarseness)   ESOPHAGOGASTRODUODENOSCOPY (EGD) WITH PROPOFOL N/A 02/12/2016   Procedure: ESOPHAGOGASTRODUODENOSCOPY (EGD) WITH PROPOFOL;  Surgeon: Milus Banister, MD;  Location: [facility];  Service: Endoscopy;  Laterality: N/A;   GALLBLADDER SURGERY     HERNIA REPAIR  01/24/2009   Lap Ventr w/ Lysis of adhesions (Dr. Donne Hazel)   knee arthroscopic surgery  years ago   right   ROTATOR CUFF REPAIR  1984   Right, [place]   SHOULDER OPEN ROTATOR CUFF REPAIR  02/08/2012   Procedure: ROTATOR CUFF REPAIR SHOULDER OPEN;  Surgeon: Magnus Sinning, MD;  Location: [facility];  Service: Orthopedics;  Laterality: Left;  Left Shoulder Open Anterior Acrominectomy Rotator Cuff Repair Open Distal Clavicle Resection ,tissue mend graft, and repair of biceps tendon   THUMB RELEASE  12/1997   Right   TONSILLECTOMY     TOTAL ABDOMINAL HYSTERECTOMY  1985   Due to dysmennorhea   US ECHOCARDIOGRAPHY  06/02/2007    SOCIAL HISTORY: Social History   Socioeconomic History   Marital status: Widowed    Spouse name: Not on file   Number of children: 2   Years of education: Not on file   Highest education level: Not on file  Occupational History   Occupation: Retired    Fish farm manager: RETIRED  Tobacco Use   Smoking status: Former    Packs/day: 2.00    Years: 50.00    Total pack years: 100.00    Types: Cigarettes    Quit  date: 02/05/2005    Years since quitting: 17.1   Smokeless tobacco: Never  Vaping Use   Vaping Use: Never used  Substance and Sexual Activity   Alcohol use: Yes    Comment: occasionally   Drug use: Never   Sexual activity: Not Currently  Other Topics Concern   Not on file  Social History Narrative   Widowed 2023, 2 children; Enjoys painting      Quit smoking in 2007; no alcohol; lives in [place]; [name]/d lives in [place]. Had own business. Dont drive- sec to neuropathy.    Social Determinants of Health   Financial Resource Strain: Not on file  Food Insecurity: Not on file  Transportation Needs: Not on file  Physical Activity: Not on file  Stress: Not on file  Social Connections: Not on file  Intimate Partner Violence: Not on file    FAMILY HISTORY: Family History  Problem Relation Age of Onset   Heart disease Mother    Thyroid disease Mother    Emphysema Father        One lung   Cystic fibrosis Sister    Hyperthyroidism Sister    Osteoarthritis Brother    Hyperthyroidism Brother    Esophageal cancer Neg Hx    Cancer Neg Hx        Head or neck   Colon cancer Neg Hx    Stomach cancer Neg Hx    Breast cancer Neg Hx     ALLERGIES:  is allergic to antihistamines, diphenhydramine-type; sulfonamide derivatives; incruse ellipta [umeclidinium bromide]; clarithromycin; codeine; lyrica [pregabalin]; spiriva handihaler [tiotropium bromide monohydrate]; and vraylar [cariprazine].  MEDICATIONS:  Current Outpatient Medications  Medication Sig Dispense Refill   albuterol (PROAIR HFA) 108 (90 Base) MCG/ACT inhaler Inhale 2 puffs into the lungs every 6 (six) hours as needed for wheezing or shortness of breath. INHALE 2 PUFFS INTO THE LUNGS EVERY 6 HOURS AS NEEDED FOR WHEEZING OR SHORTNESS OF BREATH 3 Inhaler 3   arformoterol (BROVANA) 15 MCG/2ML NEBU Take 2 mLs (15 mcg total) by nebulization at bedtime. 120 mL 6   budesonide (PULMICORT) 0.5 MG/2ML nebulizer solution Take 2 mLs  (0.5 mg total) by nebulization in the morning and at bedtime. 360 mL 3   busPIRone (BUSPAR) 15 MG tablet Take 1 tablet (15 mg total) by mouth 2 (two) times daily. With extra 1/2 tab midday     Cholecalciferol (VITAMIN D) 50 MCG (2000 UT) tablet Take 1 tablet (2,000 Units total) by mouth daily.     Coenzyme Q-10 200 MG CAPS Take 200 mg by mouth daily.     Cyanocobalamin (B-12) 5000 MCG CAPS Take 5,000 mcg by mouth daily.     cyclobenzaprine (FLEXERIL) 10 MG tablet Take 1 tablet (10 mg total) by mouth at bedtime. 10 tablet 0   esomeprazole (NEXIUM) 40 MG capsule Take 1 capsule (40 mg total) by mouth daily at 12 noon. 30 capsule 11   ezetimibe (ZETIA) 10 MG tablet TAKE 1 TABLET BY MOUTH AT BEDTIME 90 tablet 2   fexofenadine (ALLEGRA) 180 MG tablet Take 180 mg by mouth as needed.      FLUoxetine (PROZAC) 20 MG tablet Take 20 mg by mouth 2 (two) times daily.     fluticasone (FLONASE) 50 MCG/ACT nasal spray USE 2 SPRAYS INTO THE NOSE EVERY 12 HOURS AS NEEDED FOR STUFFY NOSE 48 mL 2   glucose blood (ONETOUCH VERIO) test strip USE AS INSTRUCTED TO CHECK SUGAR 1 TIME DAILY 100 strip 8   guaiFENesin (MUCINEX) 600 MG 12 hr tablet Take 2 tablets (1,200 mg total) by mouth 2 (two) times daily as needed for cough or to loosen phlegm.     Iron, Ferrous Sulfate, 325 (65 Fe) MG TABS Take 325 mg by mouth daily. 90 tablet 3   isosorbide mononitrate (IMDUR) 30 MG 24 hr tablet Take 1 tablet (30 mg total) by mouth daily. 90 tablet 3   levothyroxine (SYNTHROID) 125 MCG tablet Take 1 tablet (125 mcg total) by mouth daily. 90 tablet 3   lidocaine (LIDODERM) 5 % Place 1 patch onto the skin every 12 (twelve) hours. Remove & Discard patch within 12 hours or as directed by MD 10 patch 0   metFORMIN (GLUCOPHAGE) 500 MG tablet Take 1 tablet (500 mg total) by mouth every evening. 90 tablet 3   nitroGLYCERIN (NITROSTAT) 0.4 MG SL tablet Place 1 tablet (0.4 mg total) under  the tongue every 5 (five) minutes as needed for chest pain.  25 tablet 3   ONETOUCH DELICA LANCETS FINE MISC USE TO CHECK SUGAR 1 TIME DAILY 100 each 5   potassium chloride (KLOR-CON) 10 MEQ tablet Take 1 tablet (10 mEq total) by mouth daily. 90 tablet 3   rosuvastatin (CRESTOR) 10 MG tablet Take 1 tablet (10 mg total) by mouth every other day. 45 tablet 2   Spacer/Aero Chamber Mouthpiece MISC Use with  inhaler as needed.  J44.9 1 each 0   torsemide (DEMADEX) 20 MG tablet Take 1 tablet (20 mg total) by mouth daily. 90 tablet 1   No current facility-administered medications for this visit.   Facility-Administered Medications Ordered in Other Visits  Medication Dose Route Frequency Provider Last Rate Last Admin   iron sucrose (VENOFER) 200 mg in sodium chloride 0.9 % 100 mL IVPB  200 mg Intravenous Once Charlaine Dalton R, MD       methylPREDNISolone sodium succinate (SOLU-MEDROL) 125 mg/2 mL injection               PHYSICAL EXAMINATION:   There were no vitals filed for this visit.  There were no vitals filed for this visit.   Physical Exam Vitals and nursing note reviewed.  HENT:     Head: Normocephalic and atraumatic.     Mouth/Throat:     Pharynx: Oropharynx is clear.  Eyes:     Extraocular Movements: Extraocular movements intact.     Pupils: Pupils are equal, round, and reactive to light.  Cardiovascular:     Rate and Rhythm: Normal rate and regular rhythm.  Pulmonary:     Comments: Decreased breath sounds bilaterally.  Scattered wheezing. Abdominal:     Palpations: Abdomen is soft.  Musculoskeletal:        General: Normal range of motion.     Cervical back: Normal range of motion.  Skin:    General: Skin is warm.  Neurological:     General: No focal deficit present.     Mental Status: She is alert and oriented to person, place, and time.  Psychiatric:        Behavior: Behavior normal.        Judgment: Judgment normal.     LABORATORY DATA:  I have reviewed the data as listed Lab Results  Component Value Date   WBC  6.0 03/10/2022   HGB 9.9 (L) 03/10/2022   HCT 32.2 (L) 03/10/2022   MCV 98.5 03/10/2022   PLT 180 03/10/2022   Recent Labs    02/17/22 1356 02/23/22 1611 03/04/22 1537 03/10/22 1446  NA 141 144 142 141  K 3.9 4.6 4.0 4.1  CL 105 101 103 96*  CO2 32 27 32 36*  GLUCOSE 102* 96 113* 137*  BUN 26* 25 25* 34*  CREATININE 0.77 0.94 1.05* 1.36*  CALCIUM 8.8* 9.4 8.8* 8.6*  GFRNONAA >60  --  52* 38*  PROT 6.5  --  6.9 7.2  ALBUMIN 3.5  --  3.6 3.7  AST 13*  --  17 17  ALT 11  --  15 12  ALKPHOS 85  --  82 84  BILITOT 0.6  --  0.4 0.4     VAS [place] LOWER EXTREMITY VENOUS REFLUX  Result Date: 03/09/2022  Lower Venous Reflux Study Patient Name:  Alexandria [name]  Date of Exam:   03/09/2022 Medical Rec #: [id number]        Accession #:    [id number]  Date of Birth: 05-13-1935        Patient Gender: F Patient Age:   60 years Exam Location:  [facility] Procedure:      VAS [place] LOWER EXTREMITY VENOUS REFLUX Referring Phys: Jamelle Haring --------------------------------------------------------------------------------  Indications: Swelling, and venous insufficiency.  Comparison Study: 11/03/16: Bilateral CFV reflux. Bilateral SFJ reflux. Bilateral                   GSV indeterminate due to pain and inablilty to tolerate                   compressions. Performing Technologist: Ralene Cork RVT  Examination Guidelines: A complete evaluation includes B-mode imaging, spectral Doppler, color Doppler, and power Doppler as needed of all accessible portions of each vessel. Bilateral testing is considered an integral part of a complete examination. Limited examinations for reoccurring indications may be performed as noted. The reflux portion of the exam is performed with the patient in reverse Trendelenburg. Significant venous reflux is defined as >500 ms in the superficial venous system, and >1 second in the deep venous system.  Venous Reflux Times  +--------------+---------+------+-----------+------------+--------+ RIGHT         Reflux NoRefluxReflux TimeDiameter cmsComments                         Yes                                  +--------------+---------+------+-----------+------------+--------+ CFV                     yes   >1 second                      +--------------+---------+------+-----------+------------+--------+ FV prox                 yes   >1 second                      +--------------+---------+------+-----------+------------+--------+ FV mid        no                                             +--------------+---------+------+-----------+------------+--------+ FV dist       no                                             +--------------+---------+------+-----------+------------+--------+ Popliteal     no                                             +--------------+---------+------+-----------+------------+--------+ GSV at SFJ              yes    >500 ms      1.07             +--------------+---------+------+-----------+------------+--------+ GSV prox thigh          yes    >500 ms     0.458             +--------------+---------+------+-----------+------------+--------+  GSV mid thigh no                           0.486             +--------------+---------+------+-----------+------------+--------+ GSV dist thighno                           0.513             +--------------+---------+------+-----------+------------+--------+ GSV at knee   no                           0.485             +--------------+---------+------+-----------+------------+--------+ GSV prox calf no                           0.465             +--------------+---------+------+-----------+------------+--------+ SSV Pop Fossa no                           0.336             +--------------+---------+------+-----------+------------+--------+ SSV prox calf no                            0.382             +--------------+---------+------+-----------+------------+--------+ SSV mid calf  no                           0.345             +--------------+---------+------+-----------+------------+--------+  Summary: Right: Thrombus - No evidence of deep vein thrombosis seen in the right lower extremity, from the common femoral through the popliteal veins. - No evidence of superficial venous thrombosis in the right lower extremity. Deep veins - Reflux in the CFV and proximal FV. Superficial veins - Reflux in the SFJ and proximal thigh GSV Interstitial fluid in the calf.  *See table(s) above for measurements and observations. Electronically signed by Jamelle Haring on 03/09/2022 at 4:43:05 PM.    Final    DG Chest 2 View  Result Date: 03/04/2022 CLINICAL DATA:  Shortness of breath EXAM: CHEST - 2 VIEW COMPARISON:  02/17/2022 FINDINGS: Heart and mediastinal contours are within normal limits. No focal opacities or effusions. No acute bony abnormality. Aortic atherosclerosis IMPRESSION: No active cardiopulmonary disease. Electronically Signed   By: Rolm Baptise M.D.   On: 03/04/2022 16:25   DG Chest 2 View  Result Date: 02/17/2022 CLINICAL DATA:  Short of breath, right lower extremity edema for 2 weeks, history of non-small cell lung cancer EXAM: CHEST - 2 VIEW COMPARISON:  06/16/2021 FINDINGS: Frontal and lateral views of the chest demonstrate mild enlargement of the cardiac silhouette. Stable atherosclerosis of the aortic arch. Right apical consolidation again noted, not appreciably changed since prior CT, consistent with known history of lung cancer status post radiation therapy. The multiple 3 mm nodule seen on prior CT are not readily apparent by x-ray. No acute airspace disease, effusion, or pneumothorax. No acute bony abnormality. IMPRESSION: 1. Stable right apical consolidation consistent with known history of non-small cell lung cancer status post radiation therapy. 2. No acute  airspace disease. Electronically Signed   By: Diana Eves.D.  On: 02/17/2022 17:20   US Venous Img Lower Bilateral (DVT)  Result Date: 02/17/2022 CLINICAL DATA:  Leg swelling EXAM: BILATERAL LOWER EXTREMITY VENOUS DOPPLER ULTRASOUND TECHNIQUE: Gray-scale sonography with graded compression, as well as color Doppler and duplex ultrasound were performed to evaluate the lower extremity deep venous systems from the level of the common femoral vein and including the common femoral, femoral, profunda femoral, popliteal and calf veins including the posterior tibial, peroneal and gastrocnemius veins when visible. The superficial great saphenous vein was also interrogated. Spectral Doppler was utilized to evaluate flow at rest and with distal augmentation maneuvers in the common femoral, femoral and popliteal veins. COMPARISON:  None Available. FINDINGS: RIGHT LOWER EXTREMITY Common Femoral Vein: No evidence of thrombus. Normal compressibility, respiratory phasicity and response to augmentation. Saphenofemoral Junction: No evidence of thrombus. Normal compressibility and flow on color Doppler imaging. Profunda Femoral Vein: No evidence of thrombus. Normal compressibility and flow on color Doppler imaging. Femoral Vein: No evidence of thrombus. Normal compressibility, respiratory phasicity and response to augmentation. Popliteal Vein: No evidence of thrombus. Normal compressibility, respiratory phasicity and response to augmentation. Calf Veins: No evidence of thrombus. Normal compressibility and flow on color Doppler imaging. Superficial Great Saphenous Vein: No evidence of thrombus. Normal compressibility. Venous Reflux:  None. Other Findings:  None. LEFT LOWER EXTREMITY Common Femoral Vein: No evidence of thrombus. Normal compressibility, respiratory phasicity and response to augmentation. Saphenofemoral Junction: No evidence of thrombus. Normal compressibility and flow on color Doppler imaging. Profunda Femoral  Vein: No evidence of thrombus. Normal compressibility and flow on color Doppler imaging. Femoral Vein: No evidence of thrombus. Normal compressibility, respiratory phasicity and response to augmentation. Popliteal Vein: No evidence of thrombus. Normal compressibility, respiratory phasicity and response to augmentation. Calf Veins: No evidence of thrombus. Normal compressibility and flow on color Doppler imaging. Superficial Great Saphenous Vein: No evidence of thrombus. Normal compressibility. Venous Reflux:  None. Other Findings:  None. IMPRESSION: No evidence of deep venous thrombosis in either lower extremity. Electronically Signed   By: Ronney Asters M.D.   On: 02/17/2022 16:39    Symptomatic anemia  # Iron deficiency Anemia [OCT 2022- Ferritin-7]; Symptomatic -chronic intermittent [SINCE 2010]; s/p IV Venofer.  # Worsening anemia: Hemoglobin today -9.9  improved from 7.09 Nov 2021.  Proceed with iron infusions x3  including 1 today.   # ETIOLOGY:  UNCLEAR- CT CT AP- JAN- 11th, 2023- Mild wall thickening of the proximal duodenum at least in part related to under distension, may reflect peptic ulcer disease/duodenitis.  Status post evaluation with Dr. Ardis Hughs in [place] 2023]-as per patient patient is not a candidate for any upper and lower endoscopies.  So monitor for now  # Bilateral Lower extremity swelling- on lasix-  ? Cause of lower GFR- defer to PCP.   I spoke at length with the patient's daughter over the phone- regarding the patient's clinical status/plan of care.  The daughter in agreement.      # DISPOSITION: [wed pref] # Venofer today # venofer every 2 weeks- x 2 more  ## follow up  2 months- MD:  cbc/bmp; possible-- venofer- Dr.B     All questions were answered. The patient knows to call the clinic with any problems, questions or concerns.    Cammie Sickle, MD 03/10/2022 4:29 PM

## 2022-03-10 NOTE — Assessment & Plan Note (Addendum)
#   Iron deficiency Anemia [OCT 2022- Ferritin-7]; Symptomatic -chronic intermittent [SINCE 2010]; s/p IV Venofer.  # Worsening anemia: Hemoglobin today -9.9  improved from 7.09 Nov 2021.  Proceed with iron infusions x3  including 1 today.  Awaiting iron studies from today.  # ETIOLOGY:  UNCLEAR- CT CT AP- JAN- 11th, 2023- Mild wall thickening of the proximal duodenum at least in part related to under distension, may reflect peptic ulcer disease/duodenitis.  Status post evaluation with Dr. Ardis Hughs in [place] 2023]-as per patient patient is not a candidate for any upper and lower endoscopies.  So monitor for now  # Bilateral Lower extremity swelling- on lasix-  ? Cause of lower GFR- defer to PCP.   # Chronic back pain-worsening as per patient.  Unclear etiology defer to PCP  I spoke at length with the patient's daughter over the phone- regarding the patient's clinical status/plan of care.  The daughter in agreement.      # DISPOSITION: [wed pref] # Venofer today # venofer every 2 weeks- x 2 more  ## follow up  2 months- MD:  cbc/bmp; possible-- venofer- Dr.B

## 2022-03-10 NOTE — Progress Notes (Signed)
Patient feeling positive and improved energy today.

## 2022-03-11 NOTE — Progress Notes (Signed)
MERDIS, SNODGRASS ([id number]) [id number] Page 1 of 7 Visit Report for 01/04/2022 Arrival Information Details Patient Name: Date of Service: Alexandria [name] 01/04/2022 2:30 PM Medical Record Number: [id number] Patient Account Number: [id number] Date of Birth/Sex: Treating [name]: 1935-11-10 (87 y.o. Alexandria [name], Alexandria [name] Primary Care Alexandria [name]: Alexandria [name] Other Clinician: Referring Corean Yoshimura: Treating Alexandria [name]: Alexandria [name]: 4 Visit Information History Since Last Visit Added or deleted any medications: No Patient Arrived: Alexandria [name] Any new allergies or adverse reactions: No Arrival Time: 14:40 Had a fall or experienced change in No Accompanied By: self activities of daily living that may affect Transfer Assistance: None risk of falls: Patient Identification Verified: Yes Signs or symptoms of abuse/neglect since last visito No Secondary Verification Process Completed: Yes Hospitalized since last visit: No Patient Requires Transmission-Based Precautions: No Implantable device outside of the clinic excluding No Patient Has Alerts: No cellular tissue based products placed in the center since last visit: Has Dressing in Place as Prescribed: Yes Has Compression in Place as Prescribed: Yes Pain Present Now: No Electronic Signature(s) Signed: 01/04/2022 4:53:44 PM By: Alexandria Pilling [name], Alexandria [name] Entered By: Alexandria [name] on 01/04/2022 14:53:14 -------------------------------------------------------------------------------- Compression Therapy Details Patient Name: Date of Service: Alexandria [name], Alexandria [name] 01/04/2022 2:30 PM Medical Record Number: [id number] Patient Account Number: [id number] Date of Birth/Sex: Treating [name]: 1935-11-11 (87 y.o. Alexandria [name], Alexandria [name] Primary Care Ethyle Tiedt: Alexandria [name] Other Clinician: Referring Blaze Nylund: Treating Sonya Gunnoe/Extender: Alexandria Lamb Weeks in  [name]: 4 Compression Therapy Performed for Wound Assessment: Wound #1 Right,Posterior Lower Leg Performed By: Clinician Alexandria Pilling, [name] Compression Type: Four Layer Post Procedure Diagnosis Same as Pre-procedure Electronic Signature(s) Signed: 01/06/2022 10:46:15 AM By: Alexandria Pilling [name], Alexandria [name] Entered By: Alexandria [name] on 01/05/2022 10:09:44 [name] ([id number]) [id number] Page 2 of 7 -------------------------------------------------------------------------------- Lower Extremity Assessment Details Patient Name: Date of Service: Alexandria [name] 01/04/2022 2:30 PM Medical Record Number: [id number] Patient Account Number: [id number] Date of Birth/Sex: Treating [name]: 1935/08/27 (87 y.o. Alexandria [name], [name] Primary Care Malachi Suderman: Alexandria [name] Other Clinician: Referring Carolene Gitto: Treating Zarif Rathje/Extender: Alexandria [name]: 4 Edema Assessment Assessed: [name]: No] [name]: Yes] Edema: [Left: Ye] [Right: s] Calf Left: Right: Point of Measurement: 29 cm From Medial Instep 37 cm Ankle Left: Right: Point of Measurement: 9 cm From Medial Instep 22 cm Vascular Assessment Pulses: Dorsalis Pedis Palpable: [Right:Yes] Electronic Signature(s) Signed: 01/04/2022 4:53:44 PM By: Alexandria Pilling [name], Alexandria [name] Entered By: Alexandria [name] on 01/04/2022 14:53:49 -------------------------------------------------------------------------------- Multi Wound Chart Details Patient Name: Date of Service: Alexandria [name], Alexandria [name] 01/04/2022 2:30 PM Medical Record Number: [id number] Patient Account Number: [id number] Date of Birth/Sex: Treating [name]: 07/24/1935 (87 y.o. F) Primary Care Isay Perleberg: Alexandria [name] Other Clinician: Referring Mina Carlisi: Treating King Pinzon/Extender: Alexandria [name]: 4 Vital Signs Height(in): 65 Pulse(bpm): 60 Weight(lbs): 219.5 Blood Pressure(mmHg): 146/75 Body  Mass Index(BMI): 36.5 Temperature(F): 98.9 Respiratory Rate(breaths/min): 20 [1:Photos:] [N/A:N/A] Right, Posterior Lower Leg N/A N/A Wound Location: Trauma N/A N/A Wounding Event: Venous Leg Ulcer N/A N/A Primary Etiology: Cataracts, Chronic sinus N/A N/A Comorbid History: problems/congestion, Anemia, Chronic SALLE, BRANDLE ([id number]) [id number] Page 3 of 7 Obstructive Pulmonary Disease (COPD), Congestive Heart Failure, Coronary Artery Disease, Rheumatoid Arthritis, Neuropathy, Received Radiation 11/22/2021 N/A N/A Date Acquired: 4 N/A N/A Weeks of [name]: Open N/A N/A Wound Status: No N/A N/A Wound Recurrence: 0.1x0.1x0.1 N/A N/A Measurements  L x W x D (cm) 0.008 N/A N/A A (cm) : rea 0.001 N/A N/A Volume (cm) : 98.20% N/A N/A % Reduction in Area: 99.40% N/A N/A % Reduction in Volume: Full Thickness Without Exposed N/A N/A Classification: Support Structures None Present N/A N/A Exudate Amount: Distinct, outline attached N/A N/A Wound Margin: None Present (0%) N/A N/A Granulation Amount: None Present (0%) N/A N/A Necrotic Amount: Fascia: No N/A N/A Exposed Structures: Fat Layer (Subcutaneous Tissue): No Tendon: No Muscle: No Joint: No Bone: No Large (67-100%) N/A N/A Epithelialization: Excoriation: No N/A N/A Periwound Skin Texture: Induration: No Callus: No Crepitus: No Rash: No Scarring: No Maceration: No N/A N/A Periwound Skin Moisture: Dry/Scaly: No Hemosiderin Staining: Yes N/A N/A Periwound Skin Color: Atrophie Blanche: No Cyanosis: No Ecchymosis: No Erythema: No Mottled: No Pallor: No Rubor: No No Abnormality N/A N/A Temperature: [name] Notes Electronic Signature(s) Signed: 01/08/2022 1:40:10 PM By: Alexandria [name] Entered By: Alexandria Shan on 01/04/2022 15:10:07 -------------------------------------------------------------------------------- Multi-Disciplinary Care Plan  Details Patient Name: Date of Service: Alexandria [name], Alexandria [name] 01/04/2022 2:30 PM Medical Record Number: [id number] Patient Account Number: [id number] Date of Birth/Sex: Treating [name]: 1936/02/26 (87 y.o. Alexandria [name], Alexandria [name] Primary Care Bettylou Frew: Alexandria [name] Other Clinician: Referring Renner Sebald: Treating Jovee Dettinger/Extender: Alexandria [name]: 4 Active Inactive Venous Leg Ulcer Nursing Diagnoses: Actual venous Insuffiency (use after diagnosis is confirmed) Goals: Patient will maintain optimal edema control Date Initiated: 12/03/2021 Target Resolution Date: 02/06/2022 Goal Status: Active Alexandria [name], Alexandria [name] ([id number]) [id number] Page 4 of 7 Interventions: Assess peripheral edema status every visit. Compression as ordered Provide education on venous insufficiency [name] Activities: Therapeutic compression applied : 12/03/2021 Notes: Wound/Skin Impairment Nursing Diagnoses: Impaired tissue integrity Goals: Patient/caregiver will verbalize understanding of skin care regimen Date Initiated: 12/03/2021 Target Resolution Date: 02/06/2022 Goal Status: Active Ulcer/skin breakdown will have a volume reduction of 30% by week 4 Date Initiated: 12/03/2021 Target Resolution Date: 02/05/2022 Goal Status: Active Interventions: Assess patient/caregiver ability to obtain necessary supplies Assess patient/caregiver ability to perform ulcer/skin care regimen upon admission and as needed Assess ulceration(s) every visit Provide education on ulcer and skin care [name] Activities: Topical wound management initiated : 12/03/2021 Notes: Electronic Signature(s) Signed: 03/10/2022 5:36:43 PM By: Alexandria [name] Entered By: Alexandria Hammock on 01/04/2022 15:06:21 -------------------------------------------------------------------------------- Pain Assessment Details Patient Name: Date of Service: Alexandria [name], Alexandria [name]  01/04/2022 2:30 PM Medical Record Number: [id number] Patient Account Number: [id number] Date of Birth/Sex: Treating [name]: 10/15/1935 (87 y.o. Alexandria [name], Alexandria [name] Primary Care Gabe Glace: Alexandria [name] Other Clinician: Referring Hilman Kissling: Treating Tyrena Gohr/Extender: Alexandria [name]: 4 Active Problems Location of Pain Severity and Description of Pain Patient Has Paino No Site Locations Rate the pain. AURIANA, SCALIA ([id number]) [id number] Page 5 of 7 Rate the pain. Current Pain Level: 0 Pain Management and Medication Current Pain Management: Medication: No Cold Application: No Rest: No Massage: No Activity: No T.TaylorN.S.: No Heat Application: No Leg drop or elevation: No Is the Current Pain Management Adequate: Adequate How does your wound impact your activities of daily livingo Sleep: No Bathing: No Appetite: No Relationship With Others: No Bladder Continence: No Emotions: No Bowel Continence: No Work: No Toileting: No Drive: No Dressing: No Hobbies: No Engineer, maintenance) Signed: 01/04/2022 4:53:44 PM By: Alexandria Pilling [name], Alexandria [name] Entered By: Alexandria [name] on 01/04/2022 14:53:38 -------------------------------------------------------------------------------- Wound Assessment Details Patient Name: Date of Service: Alexandria [name], Alexandria [name] 01/04/2022 2:30 PM Medical  Record Number: [id number] Patient Account Number: [id number] Date of Birth/Sex: Treating [name]: 1935/10/01 (87 y.o. Alexandria [name], Alexandria [name] Primary Care Evart Mcdonnell: Alexandria [name] Other Clinician: Referring Vana Arif: Treating Darreld Hoffer/Extender: Alexandria [name]: 4 Wound Status Wound Number: 1 Primary Venous Leg Ulcer Etiology: Wound Location: Right, Posterior Lower Leg Wound Open Wounding Event: Trauma Status: Date Acquired: 11/22/2021 Comorbid Cataracts, Chronic sinus problems/congestion, Anemia,  Chronic Weeks Of [name]: 4 History: Obstructive Pulmonary Disease (COPD), Congestive Heart Failure, Clustered Wound: No Coronary Artery Disease, Rheumatoid Arthritis, Neuropathy, Received Radiation Photos KERILYN, CORTNER ([id number]) [id number] Page 6 of 7 Wound Measurements Length: (cm) 0.1 Width: (cm) 0.1 Depth: (cm) 0.1 Area: (cm) 0.008 Volume: (cm) 0.001 % Reduction in Area: 98.2% % Reduction in Volume: 99.4% Epithelialization: Large (67-100%) Tunneling: No Undermining: No Wound Description Classification: Full Thickness Without Exposed Support Wound Margin: Distinct, outline attached Exudate Amount: None Present Structures Foul Odor After Cleansing: No Slough/Fibrino No Wound Bed Granulation Amount: None Present (0%) Exposed Structure Necrotic Amount: None Present (0%) Fascia Exposed: No Fat Layer (Subcutaneous Tissue) Exposed: No Tendon Exposed: No Muscle Exposed: No Joint Exposed: No Bone Exposed: No Periwound Skin Texture Texture Color No Abnormalities Noted: Yes No Abnormalities Noted: No Atrophie Blanche: No Moisture Cyanosis: No No Abnormalities Noted: Yes Ecchymosis: No Erythema: No Hemosiderin Staining: Yes Mottled: No Pallor: No Rubor: No Temperature / Pain Temperature: No Abnormality Electronic Signature(s) Signed: 01/04/2022 4:53:44 PM By: Alexandria Pilling [name], Alexandria [name] Entered By: Alexandria [name] on 01/04/2022 14:55:26 -------------------------------------------------------------------------------- Vitals Details Patient Name: Date of Service: Alexandria [name], Alexandria [name] 01/04/2022 2:30 PM Medical Record Number: [id number] Patient Account Number: [id number] Date of Birth/Sex: Treating [name]: December 31, 1935 (87 y.o. Alexandria [name], Alexandria [name] Primary Care Carrell Rahmani: Alexandria [name] Other Clinician: Referring Shaniquia Brafford: Treating Kairee Isa/Extender: Alexandria [name]: 4 Vital Signs Time Taken:  14:40 Temperature (F): 98.9 Height (in): 65 Pulse (bpm): 87 ALLYNA, PITTSLEY E ([id number]) [id number] Page 7 of 7 Weight (lbs): 219.5 Respiratory Rate (breaths/min): 20 Body Mass Index (BMI): 36.5 Blood Pressure (mmHg): 146/75 Reference Range: 80 - 120 mg / dl Electronic Signature(s) Signed: 01/04/2022 4:53:44 PM By: Alexandria Pilling [name], Alexandria [name] Entered By: Alexandria [name] on 01/04/2022 14:53:28

## 2022-03-12 ENCOUNTER — Ambulatory Visit (INDEPENDENT_AMBULATORY_CARE_PROVIDER_SITE_OTHER): Payer: Medicare Other | Admitting: Family Medicine

## 2022-03-12 ENCOUNTER — Encounter: Payer: Self-pay | Admitting: Family Medicine

## 2022-03-12 ENCOUNTER — Other Ambulatory Visit: Payer: Self-pay | Admitting: Medical Oncology

## 2022-03-12 VITALS — BP 110/62 | HR 92 | Temp 97.7°F | Ht 65.0 in | Wt 228.0 lb

## 2022-03-12 DIAGNOSIS — J9611 Chronic respiratory failure with hypoxia: Secondary | ICD-10-CM | POA: Diagnosis not present

## 2022-03-12 DIAGNOSIS — M7989 Other specified soft tissue disorders: Secondary | ICD-10-CM

## 2022-03-12 MED ORDER — CYCLOBENZAPRINE HCL 10 MG PO TABS: 5.0000 mg | ORAL_TABLET | Freq: Two times a day (BID) | ORAL | 0 refills | Status: DC | PRN

## 2022-03-12 NOTE — Progress Notes (Unsigned)
Was seen at ER 03/04/22 for lumbar strain, advised to use lidoderm patches.  Was thought to be on torsemide and furosemide at the time but on recheck was only on torsemide at the time- daughter checked on that.    Heat and lidocaine help some with back pain.  More pain with cough and stretching.  Sitting up straight helps the back pain.  She has diffuse pain across her back.  Taking tylenol in the meantime.    Some sputum this AM.    I don't know of any contraindication to her using torsemide.    No DVT on recent u/s.  R>L BLE edema.    Cr up to 1.36 recently.  No nsaids.

## 2022-03-12 NOTE — Patient Instructions (Addendum)
Skip torsemide and potassium Saturday and Sunday.  If weight is 230 or above on Monday, take torsemide and potassium.  If below 230, don't take torsemide and potassium.   Wear the compression stockings in the meantime.  Use flexeril for muscle pain. Start with 1/2 tab.  Keep using tylenol and the patches.    Update me about your status Monday.   Take care.  Glad to see you.

## 2022-03-14 NOTE — Assessment & Plan Note (Signed)
Continue O2 via nasal cannula. Needs diuresis to help with lower extremity edema and chronic respiratory failure and hopefully limit effect on Alexandria [name] creatinine.  Discussed options. Skip torsemide and potassium Saturday and Sunday.  If weight is 230 or above on Monday, take torsemide and potassium.  If below 230, don't take torsemide and potassium.   Wear the compression stockings in the meantime for lower extremity edema. Discussed back pain.  Can use flexeril for muscle pain. Start with 1/2 tab.  Keep using tylenol and the patches.    I asked Alexandria [name] to update me about Alexandria [name] status on Monday.  We can make plans at that point.

## 2022-03-15 ENCOUNTER — Ambulatory Visit: Payer: Medicare Other | Admitting: Internal Medicine

## 2022-03-15 ENCOUNTER — Ambulatory Visit: Payer: Medicare Other

## 2022-03-15 ENCOUNTER — Other Ambulatory Visit: Payer: Medicare Other

## 2022-03-16 ENCOUNTER — Encounter: Payer: Self-pay | Admitting: Internal Medicine

## 2022-03-16 ENCOUNTER — Encounter: Payer: Self-pay | Admitting: Family Medicine

## 2022-03-16 ENCOUNTER — Ambulatory Visit (INDEPENDENT_AMBULATORY_CARE_PROVIDER_SITE_OTHER): Payer: Medicare Other | Admitting: Internal Medicine

## 2022-03-16 VITALS — BP 120/74 | HR 84 | Ht 65.0 in | Wt 228.6 lb

## 2022-03-16 DIAGNOSIS — E1140 Type 2 diabetes mellitus with diabetic neuropathy, unspecified: Secondary | ICD-10-CM | POA: Diagnosis not present

## 2022-03-16 DIAGNOSIS — Z23 Encounter for immunization: Secondary | ICD-10-CM

## 2022-03-16 DIAGNOSIS — E039 Hypothyroidism, unspecified: Secondary | ICD-10-CM

## 2022-03-16 DIAGNOSIS — E785 Hyperlipidemia, unspecified: Secondary | ICD-10-CM

## 2022-03-16 LAB — POCT GLYCOSYLATED HEMOGLOBIN (HGB A1C): Hemoglobin A1C: 5.3 % (ref 4.0–5.6)

## 2022-03-16 MED ORDER — ONETOUCH DELICA LANCETS 33G MISC: 3 refills | Status: DC

## 2022-03-16 MED ORDER — METFORMIN HCL 500 MG PO TABS: 500.0000 mg | ORAL_TABLET | Freq: Every evening | ORAL | 3 refills | Status: DC

## 2022-03-16 MED ORDER — ONETOUCH VERIO VI STRP: ORAL_STRIP | 3 refills | Status: DC

## 2022-03-16 NOTE — Patient Instructions (Signed)
Please continue Synthroid 125 mcg daily.  Take the thyroid hormone every day, with water, at least 30 minutes before breakfast, separated by at least 4 hours from: - acid reflux medications - calcium - iron - multivitamins  Please continue: - Metformin 500 mg with supper  Please stop at the lab.  Please return in 6 months with your sugar log.

## 2022-03-16 NOTE — Progress Notes (Signed)
Patient ID: Alexandria [name], female   DOB: January 17, 1936, 87 y.o.   MRN: [id number]  HPI: Alexandria [name] is a 87 y.o.-year-old female, returning for follow-up for DM2, dx in 2006, non-insulin-dependent, controlled, with complications (mild CKD) and uncontrolled hypothyroidism. Last visit 6 months ago. At last visit, the conversation was also carried out with her daughter, on the phone.  Interim history: She has fatigue, which is not new. She had chest pain -has nitroglycerin at hand. She did not have to take it.  She continues to have leg swelling. She has back pain. Also, SOB.  DM2: Reviewed HbA1c levels: Lab Results  Component Value Date   HGBA1C 5.0 09/11/2021   HGBA1C 5.0 10/28/2020   HGBA1C 5.1 04/29/2020   Pt is on: - Metformin 500 mg with dinner She was on Januvia >> stopped in summer 2016, when she was admitted for sepsis. We did not restart afterwards as her sugars remained controlled.  Pt is checking sugars 0-xa day: - am: 116, 122 >> 97-107 >> 130-140 >> up to 140 >> 126, 127 - 2h after brunch:  124-195 >> n/c >> 175 >> n/c - before lunch: 103, 109 >> n/c >> cannot remember - before dinner: 114-152 >> n/c >> 125 >> 105 >> n/c - 2h after dinner: up to 180 >> 90-120 >> cannot remember >> 167 - bedtime: 124, 147 >> n/c >> 133 >> n/c >> 179, 188 - nighttime: n/c >> 169, 175 >> n/c Lowest sugar was 97 >> 90 >> 90 >> 116; she has hypoglycemia awareness in the 60s Highest sugar was 150 >> 250 x1 (banana or cake) >> 258 (chocolate).  Glucometer:One Touch Ultra mini >> One Touch Verio  Pt's meals are:  - Breakfast: protein drink, egg, cereals >> cottage cheese, pineapple, deviled egg + tomato or fruit - Lunch: sandwich - Dinner: meat + 2 veggies - Snacks: 1-3 peanut butter, milk, crackers  -+ Mild CKD, last BUN/creatinine:  Lab Results  Component Value Date   BUN 34 (H) 03/10/2022   CREATININE 1.36 (H) 03/10/2022  Off valsartan.  -+ HL; last set of lipids: Lab  Results  Component Value Date   CHOL 140 08/06/2021   HDL 57.80 08/06/2021   LDLCALC 62 08/06/2021   LDLDIRECT 63.0 07/29/2015   TRIG 99.0 08/06/2021   CHOLHDL 2 08/06/2021  On Crestor 10 every other day, Zetia 10 daily  - last eye exam was: 06/2021: No DR, + cataract.   - No numbness and tingling in her feet.  Last foot exam 10/16/2021.  Hypothyroidism. -Uncontrolled -needs Synthroid DAW due to fluctuating TFTs.  Pt is on Synthroid 125 mcg daily, taken: - in am - fasting - at least 30 min from b'fast - + calcium, iron, multivitamins, acid reflux medicines more than 4 hours after levothyroxine - not on Biotin  Reviewed her TFTs: Lab Results  Component Value Date   TSH 1.40 09/11/2021   TSH 1.24 08/06/2021   TSH 0.17 (L) 06/08/2021   TSH 0.73 11/21/2020   TSH 3.60 09/12/2020   TSH 0.80 04/29/2020   TSH 0.29 (L) 01/25/2020   TSH 9.48 (H) 10/31/2019   TSH 6.56 (H) 08/30/2019   TSH 18.54 (H) 05/02/2019   TSH 41.98 (H) 11/01/2018   TSH 3.83 12/22/2017   TSH 0.80 02/09/2017   TSH 0.93 01/01/2016   TSH 1.75 03/14/2015   TSH 2.83 01/24/2015   TSH 0.18 (L) 12/04/2014   TSH 0.55 10/18/2014   TSH 0.341 (L) 09/29/2014  TSH 0.26 (L) 10/02/2010   Pt denies: - feeling nodules in neck - hoarseness - dysphagia - choking  She has a h/o COPD- Dr. Halford Chessman, HTN, anemia, GERD. She fell in 09/2015 >> hurt R leg (had many stitches) and R orbit. She was in rehab afterwards. She was diagnosed with lung cancer in 2021.  She had radiation therapy.  Continues to be short of breath and wears oxygen.  She has anxiety. She had a rib fracture in 06/2021.  ROS: + See HPI + rash B lower legs R > L  I reviewed pt's medications, allergies, PMH, social hx, family hx, and changes were documented in the history of present illness. Otherwise, unchanged from my initial visit note.  Past Medical History:  Diagnosis Date   Allergy, unspecified not elsewhere classified    Anxiety    Cataract     Chronic diastolic congestive heart failure (HCC)    COPD (chronic obstructive pulmonary disease) ([place]) 03/08/1998   PFTs 12/12/2002 FEV 1 1.42 (64%) ratio 58 with no better after B2 and DLCO75%; PFTs 11/20/09 FEV1 1.50 (73%) ratio 50 no better after B2 with DLCO 62%; Hfa 50% 11/20/2009 >75%, 01/13/10 p coaching   Depression 12/07/1998   Diabetes mellitus type II 02/05/2006   Dr. Cruzita Lederer with endo   Disorders of bursae and tendons in shoulder region, unspecified    Rotator cuff syndrome, right   E. coli bacteremia    Esophagitis    GERD (gastroesophageal reflux disease)    History of UTI    Hyperlipidemia 10/04/2000   Hypertension 03/08/1992   Hypothyroidism 03/08/1968   Iron deficiency anemia    Lung nodule    radiation starts 10-08-2019   Malignant neoplasm of right upper lobe of lung ([place]) 07/24/2019   Obesity    NOS   OSA (obstructive sleep apnea)    PSG 01/27/10 AHI 13, pt does not know CPAP settings   Peripheral neuropathy    Likely due to DM per Dr. Murvin Natal hernia    Past Surgical History:  Procedure Laterality Date   ABD U/S  03/19/1999   Nml x2 foci in liver   ADENOSINE MYOVIEW  06/02/2007   Nml   CARDIOLITE PERSANTINE  08/24/2000   Nml   CAROTID U/S  08/24/2000   1-39% ICA stenosis   CAROTID U/S  06/02/2007   No apprec change    CARPAL TUNNEL RELEASE  12/1997   Right   CESAREAN SECTION     x2 [place]/ repeat   CHOLECYSTECTOMY  1997   COLONOSCOPY WITH PROPOFOL N/A 02/12/2016   Procedure: COLONOSCOPY WITH PROPOFOL;  Surgeon: Milus Banister, MD;  Location: [facility];  Service: Endoscopy;  Laterality: N/A;   CT ABD W & PELVIS WO/W CM     Abd hemangiomas of liver, 1 cm R renal cyst   DENTAL SURGERY  2016   Implants   DEXA  07/03/2003   Nml   ESOPHAGOGASTRODUODENOSCOPY  12/05/1997   Nml (due to hoarseness)   ESOPHAGOGASTRODUODENOSCOPY (EGD) WITH PROPOFOL N/A 02/12/2016   Procedure: ESOPHAGOGASTRODUODENOSCOPY (EGD) WITH PROPOFOL;  Surgeon: Milus Banister,  MD;  Location: [facility];  Service: Endoscopy;  Laterality: N/A;   GALLBLADDER SURGERY     HERNIA REPAIR  01/24/2009   Lap Ventr w/ Lysis of adhesions (Dr. Donne Hazel)   knee arthroscopic surgery  years ago   right   ROTATOR CUFF REPAIR  1984   Right, [place]   SHOULDER OPEN ROTATOR CUFF REPAIR  02/08/2012  Procedure: ROTATOR CUFF REPAIR SHOULDER OPEN;  Surgeon: Magnus Sinning, MD;  Location: [facility];  Service: Orthopedics;  Laterality: Left;  Left Shoulder Open Anterior Acrominectomy Rotator Cuff Repair Open Distal Clavicle Resection ,tissue mend graft, and repair of biceps tendon   THUMB RELEASE  12/1997   Right   TONSILLECTOMY     TOTAL ABDOMINAL HYSTERECTOMY  1985   Due to dysmennorhea   US ECHOCARDIOGRAPHY  06/02/2007   Social History   Occupational History   Retired - self employed- Engineer, structural    Social History Main Topics   Smoking status: Former Smoker -- 2.00 packs/day for 50 years    Types: Cigarettes    Quit date: 2007   Smokeless tobacco: Never Used   Alcohol Use: 1.2 oz/week    2 Standard drinks or equivalent per week     Comment: occasional   Drug Use: No   Sexual Activity: Not on file   Social History Narrative   Married with 2 children   Enjoys painting   Current Outpatient Medications on File Prior to Visit  Medication Sig Dispense Refill   albuterol (PROAIR HFA) 108 (90 Base) MCG/ACT inhaler Inhale 2 puffs into the lungs every 6 (six) hours as needed for wheezing or shortness of breath. INHALE 2 PUFFS INTO THE LUNGS EVERY 6 HOURS AS NEEDED FOR WHEEZING OR SHORTNESS OF BREATH 3 Inhaler 3   arformoterol (BROVANA) 15 MCG/2ML NEBU Take 2 mLs (15 mcg total) by nebulization at bedtime. 120 mL 6   budesonide (PULMICORT) 0.5 MG/2ML nebulizer solution Take 2 mLs (0.5 mg total) by nebulization in the morning and at bedtime. 360 mL 3   busPIRone (BUSPAR) 15 MG tablet Take 1 tablet (15 mg total) by mouth 2 (two) times daily. With extra 1/2 tab midday      Cholecalciferol (VITAMIN D) 50 MCG (2000 UT) tablet Take 1 tablet (2,000 Units total) by mouth daily.     Coenzyme Q-10 200 MG CAPS Take 200 mg by mouth daily.     Cyanocobalamin (B-12) 5000 MCG CAPS Take 5,000 mcg by mouth daily.     cyclobenzaprine (FLEXERIL) 10 MG tablet Take 0.5-1 tablets (5-10 mg total) by mouth 2 (two) times daily as needed for muscle spasms. 30 tablet 0   esomeprazole (NEXIUM) 40 MG capsule Take 1 capsule (40 mg total) by mouth daily at 12 noon. 30 capsule 11   ezetimibe (ZETIA) 10 MG tablet TAKE 1 TABLET BY MOUTH AT BEDTIME 90 tablet 2   fexofenadine (ALLEGRA) 180 MG tablet Take 180 mg by mouth as needed.      FLUoxetine (PROZAC) 20 MG tablet Take 20 mg by mouth 2 (two) times daily.     fluticasone (FLONASE) 50 MCG/ACT nasal spray USE 2 SPRAYS INTO THE NOSE EVERY 12 HOURS AS NEEDED FOR STUFFY NOSE 48 mL 2   glucose blood (ONETOUCH VERIO) test strip USE AS INSTRUCTED TO CHECK SUGAR 1 TIME DAILY 100 strip 8   guaiFENesin (MUCINEX) 600 MG 12 hr tablet Take 2 tablets (1,200 mg total) by mouth 2 (two) times daily as needed for cough or to loosen phlegm.     Iron, Ferrous Sulfate, 325 (65 Fe) MG TABS Take 325 mg by mouth daily. 90 tablet 3   isosorbide mononitrate (IMDUR) 30 MG 24 hr tablet Take 1 tablet (30 mg total) by mouth daily. 90 tablet 3   levothyroxine (SYNTHROID) 125 MCG tablet Take 1 tablet (125 mcg total) by mouth daily. 90 tablet 3  lidocaine (LIDODERM) 5 % Place 1 patch onto the skin every 12 (twelve) hours. Remove & Discard patch within 12 hours or as directed by MD 10 patch 0   metFORMIN (GLUCOPHAGE) 500 MG tablet Take 1 tablet (500 mg total) by mouth every evening. 90 tablet 3   nitroGLYCERIN (NITROSTAT) 0.4 MG SL tablet Place 1 tablet (0.4 mg total) under the tongue every 5 (five) minutes as needed for chest pain. 25 tablet 3   ONETOUCH DELICA LANCETS FINE MISC USE TO CHECK SUGAR 1 TIME DAILY 100 each 5   potassium chloride (KLOR-CON) 10 MEQ tablet Take 1  tablet (10 mEq total) by mouth daily. 90 tablet 3   rosuvastatin (CRESTOR) 10 MG tablet Take 1 tablet (10 mg total) by mouth every other day. 45 tablet 2   Spacer/Aero Chamber Mouthpiece MISC Use with  inhaler as needed.  J44.9 1 each 0   torsemide (DEMADEX) 20 MG tablet Take 1 tablet (20 mg total) by mouth daily. 90 tablet 1   No current facility-administered medications on file prior to visit.   Allergies  Allergen Reactions   Antihistamines, Diphenhydramine-Type Other (See Comments)    Able to tolerate only allegra.    Sulfonamide Derivatives Swelling    REACTION: closed throat   Incruse Ellipta [Umeclidinium Bromide] Cough    Caused voice change and severe coughing   Clarithromycin Other (See Comments)    REACTION: diff swallowing and mouth blisters   Codeine Other (See Comments)    Unknown    Lyrica [Pregabalin] Other (See Comments)    Lack of effect for neuropathy pain.     Spiriva Handihaler [Tiotropium Bromide Monohydrate] Other (See Comments)    Voice changes   Vraylar [Cariprazine]     intolerant   Family History  Problem Relation Age of Onset   Heart disease Mother    Thyroid disease Mother    Emphysema Father        One lung   Cystic fibrosis Sister    Hyperthyroidism Sister    Osteoarthritis Brother    Hyperthyroidism Brother    Esophageal cancer Neg Hx    Cancer Neg Hx        Head or neck   Colon cancer Neg Hx    Stomach cancer Neg Hx    Breast cancer Neg Hx    PE: BP 120/74 (BP Location: Left Arm, Patient Position: Sitting, Cuff Size: Normal)   Pulse 84   Ht 5\' 5"  (1.651 m)   Wt 228 lb 9.6 oz (103.7 kg)   LMP  (LMP Unknown)   SpO2 94%   BMI 38.04 kg/m    Wt Readings from Last 3 Encounters:  03/16/22 228 lb 9.6 oz (103.7 kg)  03/12/22 228 lb (103.4 kg)  03/09/22 228 lb (103.4 kg)   Constitutional: overweight, in NAD, + walks with a walker- on 3 L oxygen now Eyes: EOMI, no exophthalmos ENT: no thyromegaly, no cervical  lymphadenopathy Cardiovascular: RRR, No RG, +1/6 SEM, + R LE edema Respiratory: CTA B Musculoskeletal: no deformities Skin: + right LE erythema on the lower leg-chronic after surgery on her right knee Neurological: no tremor with outstretched hands  ASSESSMENT: 1. DM2, non-insulin-dependent, controlled, with complications - mild CKD  2. Hypothyroidism  3. HL  PLAN:  1. Patient with longstanding, previously uncontrolled type 2 diabetes, on low-dose metformin, with good control lately.  Latest HbA1c was 5.0%, at goal, stable.  At last visit, she did not check sugars in the months prior  to the visit due to a broken glucometer.  We gave her another meter at last visit and sent supplies to her pharmacy.  We discussed about blood sugar goals.  We did not change her regimen. -At today's visit, we only have a few blood sugar checks in her logs.  They are approximately at goal or slightly higher.  We again discussed about the importance of checking more blood sugars and rotating blood sugar checks, but for now, I did not suggest a change in regimen. - I advised her to: Patient Instructions  Please continue Synthroid 125 mcg daily.  Take the thyroid hormone every day, with water, at least 30 minutes before breakfast, separated by at least 4 hours from: - acid reflux medications - calcium - iron - multivitamins  Please continue: - Metformin 500 mg with supper  Please stop at the lab.  Please return in 6 months with your sugar log.   - we checked her HbA1c: 5.3% (slightly higher) - advised to check sugars at different times of the day - 1x a day, rotating check times - advised for yearly eye exams >> she is UTD - I refilled her meter supplies and metformin - return to clinic in 6 months  2. Hypothyroidism - latest thyroid labs reviewed with pt. >> normal: Lab Results  Component Value Date   TSH 1.40 09/11/2021  - she continues on Synthroid DAW 125 mcg daily - pt feels good on this  dose. - we discussed about taking the thyroid hormone every day, with water, >30 minutes before breakfast, separated by >4 hours from acid reflux medications, calcium, iron, multivitamins. Pt. is taking it correctly.  3. HL -Reviewed latest lipid panel from 08/2021: Fractions at goal: Lab Results  Component Value Date   CHOL 140 08/06/2021   HDL 57.80 08/06/2021   LDLCALC 62 08/06/2021   LDLDIRECT 63.0 07/29/2015   TRIG 99.0 08/06/2021   CHOLHDL 2 08/06/2021  -She continues on Crestor 10 mg daily and Zetia 10 mg daily, without side effects  + flu shot today  Philemon Kingdom, MD PhD [facility] Endocrinology

## 2022-03-17 ENCOUNTER — Other Ambulatory Visit: Payer: Self-pay | Admitting: Family Medicine

## 2022-03-17 ENCOUNTER — Other Ambulatory Visit: Payer: Self-pay | Admitting: Internal Medicine

## 2022-03-17 ENCOUNTER — Encounter: Payer: Self-pay | Admitting: Internal Medicine

## 2022-03-17 NOTE — Progress Notes (Signed)
Patient ID: Alexandria [name], female    DOB: 1935/10/09, 87 y.o.   MRN: [id number]  HPI  Ms Uplinger is a 87 y/o female with a history of lung cancer, DM, hyperlipidemia, HTN, thyroid disease, anxiety, COPD, GERD, anemia, OSA, previous tobacco use and chronic heart failure.   Echo 02/11/21 showed EF of 55-60% along with mild/ moderate LAE and moderate/ severe mitral annular calcification.   Was in the ED 03/04/22 due to LBP. Was in the ED 02/17/22 due to peripheral edema.   She presents today for her initial visit with a chief complaint of moderate SOB w/ exertion. Describes this as chronic. Has associated fatigue, dizziness and pedal edema along with this. Denies difficulty sleeping, abdominal distention, palpitations, chest pain or weight gain.   Wears oxygen at 2L RTC but will increase it to 3L with exertion.   Past Medical History:  Diagnosis Date   Allergy, unspecified not elsewhere classified    Anxiety    Cataract    Chronic diastolic congestive heart failure (HCC)    COPD (chronic obstructive pulmonary disease) (HCC) 03/08/1998   PFTs 12/12/2002 FEV 1 1.42 (64%) ratio 58 with no better after B2 and DLCO75%; PFTs 11/20/09 FEV1 1.50 (73%) ratio 50 no better after B2 with DLCO 62%; Hfa 50% 11/20/2009 >75%, 01/13/10 p coaching   Depression 12/07/1998   Diabetes mellitus type II 02/05/2006   Dr. Elvera Lennox with endo   Disorders of bursae and tendons in shoulder region, unspecified    Rotator cuff syndrome, right   E. coli bacteremia    Esophagitis    GERD (gastroesophageal reflux disease)    History of UTI    Hyperlipidemia 10/04/2000   Hypertension 03/08/1992   Hypothyroidism 03/08/1968   Iron deficiency anemia    Lung nodule    radiation starts 10-08-2019   Malignant neoplasm of right upper lobe of lung (HCC) 07/24/2019   Obesity    NOS   OSA (obstructive sleep apnea)    PSG 01/27/10 AHI 13, pt does not know CPAP settings   Peripheral neuropathy    Likely due to DM per Dr. Tresa Endo hernia    Past Surgical History:  Procedure Laterality Date   ABD U/S  03/19/1999   Nml x2 foci in liver   ADENOSINE MYOVIEW  06/02/2007   Nml   CARDIOLITE PERSANTINE  08/24/2000   Nml   CAROTID U/S  08/24/2000   1-39% ICA stenosis   CAROTID U/S  06/02/2007   No apprec change    CARPAL TUNNEL RELEASE  12/1997   Right   CESAREAN SECTION     x2 Bree[place]/ repeat   CHOLECYSTECTOMY  1997   COLONOSCOPY WITH PROPOFOL N/A 02/12/2016   Procedure: COLONOSCOPY WITH PROPOFOL;  Surgeon: Rachael Fee, MD;  Location: [facility];  Service: Endoscopy;  Laterality: N/A;   CT ABD W & PELVIS WO/W CM     Abd hemangiomas of liver, 1 cm R renal cyst   DENTAL SURGERY  2016   Implants   DEXA  07/03/2003   Nml   ESOPHAGOGASTRODUODENOSCOPY  12/05/1997   Nml (due to hoarseness)   ESOPHAGOGASTRODUODENOSCOPY (EGD) WITH PROPOFOL N/A 02/12/2016   Procedure: ESOPHAGOGASTRODUODENOSCOPY (EGD) WITH PROPOFOL;  Surgeon: Rachael Fee, MD;  Location: [facility];  Service: Endoscopy;  Laterality: N/A;   GALLBLADDER SURGERY     HERNIA REPAIR  01/24/2009   Lap Ventr w/ Lysis of adhesions (Dr. Dwain Sarna)   knee arthroscopic surgery  years ago  right   ROTATOR CUFF REPAIR  1984   Right, [place]   SHOULDER OPEN ROTATOR CUFF REPAIR  02/08/2012   Procedure: ROTATOR CUFF REPAIR SHOULDER OPEN;  Surgeon: Drucilla Schmidt, MD;  Location: [facility];  Service: Orthopedics;  Laterality: Left;  Left Shoulder Open Anterior Acrominectomy Rotator Cuff Repair Open Distal Clavicle Resection ,tissue mend graft, and repair of biceps tendon   THUMB RELEASE  12/1997   Right   TONSILLECTOMY     TOTAL ABDOMINAL HYSTERECTOMY  1985   Due to dysmennorhea   US ECHOCARDIOGRAPHY  06/02/2007   Family History  Problem Relation Age of Onset   Heart disease Mother    Thyroid disease Mother    Emphysema Father        One lung   Cystic fibrosis Sister    Hyperthyroidism Sister    Osteoarthritis Brother    Hyperthyroidism  Brother    Esophageal cancer Neg Hx    Cancer Neg Hx        Head or neck   Colon cancer Neg Hx    Stomach cancer Neg Hx    Breast cancer Neg Hx    Social History   Tobacco Use   Smoking status: Former    Packs/day: 2.00    Years: 50.00    Total pack years: 100.00    Types: Cigarettes    Quit date: 02/05/2005    Years since quitting: 17.1   Smokeless tobacco: Never  Substance Use Topics   Alcohol use: Yes    Comment: occasionally   Allergies  Allergen Reactions   Antihistamines, Diphenhydramine-Type Other (See Comments)    Able to tolerate only allegra.    Sulfonamide Derivatives Swelling    REACTION: closed throat   Incruse Ellipta [Umeclidinium Bromide] Cough    Caused voice change and severe coughing   Clarithromycin Other (See Comments)    REACTION: diff swallowing and mouth blisters   Codeine Other (See Comments)    Unknown    Lyrica [Pregabalin] Other (See Comments)    Lack of effect for neuropathy pain.     Spiriva Handihaler [Tiotropium Bromide Monohydrate] Other (See Comments)    Voice changes   Vraylar [Cariprazine]     intolerant   Prior to Admission medications   Medication Sig Start Date End Date Taking? Authorizing Provider  albuterol (PROAIR HFA) 108 (90 Base) MCG/ACT inhaler Inhale 2 puffs into the lungs every 6 (six) hours as needed for wheezing or shortness of breath. INHALE 2 PUFFS INTO THE LUNGS EVERY 6 HOURS AS NEEDED FOR WHEEZING OR SHORTNESS OF BREATH 04/19/18  Yes Coralyn Helling, MD  arformoterol (BROVANA) 15 MCG/2ML NEBU Take 2 mLs (15 mcg total) by nebulization at bedtime. Patient taking differently: Take 15 mcg by nebulization every evening. 12/01/21  Yes Sood, Laurier Nancy, MD  budesonide (PULMICORT) 0.5 MG/2ML nebulizer solution Take 2 mLs (0.5 mg total) by nebulization in the morning and at bedtime. Patient taking differently: Take 0.5 mg by nebulization 2 (two) times daily. 11/20/21  Yes Coralyn Helling, MD  busPIRone (BUSPAR) 15 MG tablet Take 1 tablet  (15 mg total) by mouth 2 (two) times daily. With extra 1/2 tab midday 12/31/21  Yes Joaquim Nam, MD  Cholecalciferol (VITAMIN D) 50 MCG (2000 UT) tablet Take 1 tablet (2,000 Units total) by mouth daily. 06/27/18  Yes Joaquim Nam, MD  Coenzyme Q-10 200 MG CAPS Take 200 mg by mouth daily.   Yes [provider]  Cyanocobalamin (B-12) 5000 MCG CAPS  Take 5,000 mcg by mouth daily.   Yes [provider]  cyclobenzaprine (FLEXERIL) 10 MG tablet Take 0.5-1 tablets (5-10 mg total) by mouth 2 (two) times daily as needed for muscle spasms. Patient taking differently: Take 5 mg by mouth at bedtime as needed for muscle spasms. 03/12/22  Yes Joaquim Nam, MD  esomeprazole (NEXIUM) 40 MG capsule Take 1 capsule (40 mg total) by mouth daily at 12 noon. 08/17/21  Yes Unk Lightning, PA  ezetimibe (ZETIA) 10 MG tablet TAKE 1 TABLET BY MOUTH EVERYDAY AT BEDTIME 03/18/22  Yes Joaquim Nam, MD  fexofenadine (ALLEGRA) 180 MG tablet Take 180 mg by mouth as needed.    Yes [provider]  FLUoxetine (PROZAC) 20 MG tablet Take 20 mg by mouth 2 (two) times daily.   Yes [provider]  fluticasone (FLONASE) 50 MCG/ACT nasal spray USE 2 SPRAYS INTO THE NOSE EVERY 12 HOURS AS NEEDED FOR STUFFY NOSE 03/10/20  Yes Joaquim Nam, MD  glucose blood (ONETOUCH VERIO) test strip USE AS INSTRUCTED TO CHECK SUGAR 1 TIME DAILY 03/16/22  Yes Carlus Pavlov, MD  guaiFENesin (MUCINEX) 600 MG 12 hr tablet Take 2 tablets (1,200 mg total) by mouth 2 (two) times daily as needed for cough or to loosen phlegm. 08/25/21  Yes Coralyn Helling, MD  Iron, Ferrous Sulfate, 325 (65 Fe) MG TABS Take 325 mg by mouth daily. 05/04/21  Yes Joaquim Nam, MD  isosorbide mononitrate (IMDUR) 30 MG 24 hr tablet Take 1 tablet (30 mg total) by mouth daily. 02/23/22  Yes Tereso Newcomer T, PA-C  levothyroxine (SYNTHROID) 125 MCG tablet Take 1 tablet (125 mcg total) by mouth daily. 09/11/21  Yes Carlus Pavlov, MD   lidocaine (LIDODERM) 5 % Place 1 patch onto the skin every 12 (twelve) hours. Remove & Discard patch within 12 hours or as directed by MD 03/04/22 03/04/23 Yes Chesley Noon, MD  metFORMIN (GLUCOPHAGE) 500 MG tablet Take 1 tablet (500 mg total) by mouth every evening. 03/16/22  Yes Carlus Pavlov, MD  nitroGLYCERIN (NITROSTAT) 0.4 MG SL tablet Place 1 tablet (0.4 mg total) under the tongue every 5 (five) minutes as needed for chest pain. 07/16/21  Yes Joaquim Nam, MD  OneTouch Delica Lancets 33G MISC Use 1x a day 03/16/22  Yes Carlus Pavlov, MD  potassium chloride (KLOR-CON) 10 MEQ tablet Take 1 tablet (10 mEq total) by mouth daily. 02/23/22  Yes Weaver, Scott T, PA-C  rosuvastatin (CRESTOR) 10 MG tablet Take 1 tablet (10 mg total) by mouth every other day. 11/10/21  Yes Joaquim Nam, MD  torsemide (DEMADEX) 20 MG tablet Take 1 tablet (20 mg total) by mouth daily. 02/23/22  Yes Beatrice Lecher, PA-C  Spacer/Aero Chamber Mouthpiece MISC Use with  inhaler as needed.  J44.9 09/30/17   Joaquim Nam, MD   Review of Systems  Constitutional:  Positive for fatigue. Negative for appetite change.  HENT:  Negative for congestion, postnasal drip and sore throat.   Eyes: Negative.   Respiratory:  Positive for cough and shortness of breath. Negative for chest tightness.   Cardiovascular:  Positive for leg swelling (R>L). Negative for chest pain and palpitations.  Gastrointestinal:  Negative for abdominal distention and abdominal pain.  Endocrine: Negative.   Genitourinary: Negative.   Musculoskeletal:  Positive for back pain. Negative for neck pain.  Skin: Negative.   Allergic/Immunologic: Negative.   Neurological:  Positive for dizziness and light-headedness.  Hematological:  Negative for adenopathy.  Does not bruise/bleed easily.  Psychiatric/Behavioral:  Negative for dysphoric mood and sleep disturbance (adjustable bed; sleeping on 1 pillow with oxygen @ 2L). The patient is  nervous/anxious.    Vitals:   03/18/22 1524  BP: (!) 130/55  Pulse: 87  Resp: 18  SpO2: 92%  Weight: 227 lb (103 kg)   Wt Readings from Last 3 Encounters:  03/18/22 227 lb (103 kg)  03/16/22 228 lb 9.6 oz (103.7 kg)  03/12/22 228 lb (103.4 kg)   Lab Results  Component Value Date   CREATININE 1.36 (H) 03/10/2022   CREATININE 1.05 (H) 03/04/2022   CREATININE 0.94 02/23/2022   Physical Exam Vitals and nursing note reviewed. Exam conducted with a chaperone present (sister).  Constitutional:      Appearance: Normal appearance.  HENT:     Head: Normocephalic and atraumatic.  Cardiovascular:     Rate and Rhythm: Normal rate and regular rhythm.  Pulmonary:     Effort: Pulmonary effort is normal. No respiratory distress.     Breath sounds: No wheezing, rhonchi or rales.  Abdominal:     General: There is no distension.     Palpations: Abdomen is soft.     Tenderness: There is no abdominal tenderness.  Musculoskeletal:        General: Tenderness present.     Cervical back: Normal range of motion and neck supple.     Right lower leg: Edema (1+ pitting) present.     Left lower leg: Edema (trace pitting) present.  Skin:    General: Skin is warm and dry.  Neurological:     General: No focal deficit present.     Mental Status: She is alert and oriented to person, place, and time.  Psychiatric:        Mood and Affect: Mood normal.        Behavior: Behavior normal.   Assessment & Plan:  1: Chronic heart failure with preserved ejection fraction with LAE- - NYHA class III - euvolemic  - weighing daily; reminded to call for an overnight weight gain of > 2 pounds or a weekly weight gain of > 5 pounds - has echo done scheduled for 03/29/22 - not adding salt and has been reading labels for sodium content; understands to keep it 2000mg  - saw cardiology Alben Spittle) 02/23/22 - drinks 32 oz water, 16 oz cranberry juice/ 7up, 1 cup coffee (unknown oz) and yogurt; reviewed keeping fluid  intake to ~ 60 ounces daily and rationale for this explained - already wearing thigh high compression socks and elevating legs - h/o UTI and soft BP; creatinine starting to trend upward - consider losartan 12.5mg  at next visit - BNP 03/04/22 was 88.2 - PharmD reconciled medications w/ patient  2: HTN- - BP 130/55 - saw PCP Para March) 03/12/22 - BMP 03/10/22 showed sodium 141, potassium 4.1, creatinine 1.36 & GFR 38  3: COPD- - wearing oxygen at 2L RTC except will increase it to 3L upon exertion  4: DM- - saw endocrinology Elvera Lennox) 03/16/22 - A1c 03/16/22 was 5.3% - home glucose tody was 119   Medications reviewed.   Return in 2 months, sooner if needed.

## 2022-03-18 ENCOUNTER — Telehealth: Payer: Self-pay | Admitting: Cardiovascular Disease

## 2022-03-18 ENCOUNTER — Ambulatory Visit: Payer: Medicare Other | Attending: Family | Admitting: Family

## 2022-03-18 ENCOUNTER — Ambulatory Visit: Payer: Medicare Other

## 2022-03-18 ENCOUNTER — Encounter: Payer: Self-pay | Admitting: Pharmacy Technician

## 2022-03-18 ENCOUNTER — Other Ambulatory Visit: Payer: Self-pay | Admitting: Family Medicine

## 2022-03-18 VITALS — BP 130/55 | HR 87 | Resp 18 | Wt 227.0 lb

## 2022-03-18 DIAGNOSIS — F419 Anxiety disorder, unspecified: Secondary | ICD-10-CM | POA: Insufficient documentation

## 2022-03-18 DIAGNOSIS — Z7984 Long term (current) use of oral hypoglycemic drugs: Secondary | ICD-10-CM | POA: Insufficient documentation

## 2022-03-18 DIAGNOSIS — K2100 Gastro-esophageal reflux disease with esophagitis, without bleeding: Secondary | ICD-10-CM | POA: Diagnosis not present

## 2022-03-18 DIAGNOSIS — I110 Hypertensive heart disease with heart failure: Secondary | ICD-10-CM | POA: Diagnosis not present

## 2022-03-18 DIAGNOSIS — D649 Anemia, unspecified: Secondary | ICD-10-CM | POA: Insufficient documentation

## 2022-03-18 DIAGNOSIS — R42 Dizziness and giddiness: Secondary | ICD-10-CM | POA: Diagnosis not present

## 2022-03-18 DIAGNOSIS — J439 Emphysema, unspecified: Secondary | ICD-10-CM | POA: Diagnosis not present

## 2022-03-18 DIAGNOSIS — E1136 Type 2 diabetes mellitus with diabetic cataract: Secondary | ICD-10-CM | POA: Diagnosis not present

## 2022-03-18 DIAGNOSIS — E1122 Type 2 diabetes mellitus with diabetic chronic kidney disease: Secondary | ICD-10-CM

## 2022-03-18 DIAGNOSIS — R5383 Other fatigue: Secondary | ICD-10-CM | POA: Diagnosis not present

## 2022-03-18 DIAGNOSIS — Z87891 Personal history of nicotine dependence: Secondary | ICD-10-CM | POA: Diagnosis not present

## 2022-03-18 DIAGNOSIS — G4733 Obstructive sleep apnea (adult) (pediatric): Secondary | ICD-10-CM | POA: Insufficient documentation

## 2022-03-18 DIAGNOSIS — E785 Hyperlipidemia, unspecified: Secondary | ICD-10-CM | POA: Insufficient documentation

## 2022-03-18 DIAGNOSIS — I5032 Chronic diastolic (congestive) heart failure: Secondary | ICD-10-CM | POA: Insufficient documentation

## 2022-03-18 DIAGNOSIS — I3481 Nonrheumatic mitral (valve) annulus calcification: Secondary | ICD-10-CM | POA: Insufficient documentation

## 2022-03-18 DIAGNOSIS — E1142 Type 2 diabetes mellitus with diabetic polyneuropathy: Secondary | ICD-10-CM | POA: Diagnosis not present

## 2022-03-18 DIAGNOSIS — J4489 Other specified chronic obstructive pulmonary disease: Secondary | ICD-10-CM

## 2022-03-18 DIAGNOSIS — J449 Chronic obstructive pulmonary disease, unspecified: Secondary | ICD-10-CM | POA: Insufficient documentation

## 2022-03-18 DIAGNOSIS — N1832 Chronic kidney disease, stage 3b: Secondary | ICD-10-CM | POA: Diagnosis not present

## 2022-03-18 DIAGNOSIS — I10 Essential (primary) hypertension: Secondary | ICD-10-CM

## 2022-03-18 NOTE — Patient Instructions (Addendum)
Continue weighing daily and call for an overnight weight gain of 3 pounds or more or a weekly weight gain of more than 5 pounds.   If you have voicemail, please make sure your mailbox is cleaned out so that we may leave a message and please make sure to listen to any voicemails.    Drink around 60 ounces of fluid daily

## 2022-03-18 NOTE — Telephone Encounter (Signed)
LMOV about possible ECHO available at the hospital on 01/22 at 11

## 2022-03-19 ENCOUNTER — Ambulatory Visit (HOSPITAL_COMMUNITY)
Admission: RE | Admit: 2022-03-19 | Discharge: 2022-03-19 | Disposition: A | Discharge status: Home / Self Care | Payer: Medicare Other | Source: Ambulatory Visit | Attending: Radiation Oncology | Admitting: Radiation Oncology

## 2022-03-19 ENCOUNTER — Other Ambulatory Visit: Payer: Self-pay

## 2022-03-19 ENCOUNTER — Encounter: Payer: Self-pay | Admitting: Family

## 2022-03-19 DIAGNOSIS — R918 Other nonspecific abnormal finding of lung field: Secondary | ICD-10-CM | POA: Diagnosis not present

## 2022-03-19 DIAGNOSIS — J439 Emphysema, unspecified: Secondary | ICD-10-CM | POA: Diagnosis not present

## 2022-03-19 DIAGNOSIS — C3411 Malignant neoplasm of upper lobe, right bronchus or lung: Secondary | ICD-10-CM | POA: Diagnosis not present

## 2022-03-19 LAB — POCT I-STAT CREATININE: Creatinine, Ser: 1.2 mg/dL — ABNORMAL HIGH (ref 0.44–1.00)

## 2022-03-19 MED ORDER — IOHEXOL 300 MG/ML  SOLN
60.0000 mL | Freq: Once | INTRAMUSCULAR | Status: AC | PRN
  Administered 2022-03-19: 60 mL via INTRAVENOUS

## 2022-03-19 MED ORDER — SODIUM CHLORIDE (PF) 0.9 % IJ SOLN
INTRAMUSCULAR | Status: AC
  Filled 2022-03-19: qty 50

## 2022-03-19 NOTE — Progress Notes (Signed)
[place] - PHARMACIST COUNSELING NOTE  Guideline-Directed Medical Therapy/Evidence Based Medicine  ACE/ARB/ARNI: N/A Beta Blocker:  None Aldosterone Antagonist:  None Diuretic: Torsemide 20 mg daily SGLT2i:  None  Adherence Assessment  Do you ever forget to take your medication? [] Yes [x] No  Do you ever skip doses due to side effects? [] Yes [x] No  Do you have trouble affording your medicines? [] Yes [x] No  Are you ever unable to pick up your medication due to transportation difficulties? [] Yes [x] No  Do you ever stop taking your medications because you don't believe they are helping? [] Yes [x] No  Do you check your weight daily? [x] Yes [] No   Adherence strategy: Pillbox and daughter, sister, and neighbor assist. She has lots of assistance.  Barriers to obtaining medications: None  Vital signs: HR 83, BP 130/55, weight (pounds) 227 ECHO: Date 02/2021, EF 55-60%, notes     Latest Ref Rng & Units 03/10/2022    2:46 PM 03/04/2022    3:37 PM 02/23/2022    4:11 PM  BMP  Glucose 70 - 99 mg/dL 137  113  96   BUN 8 - 23 mg/dL 34  25  25   Creatinine 0.44 - 1.00 mg/dL 1.36  1.05  0.94   BUN/Creat Ratio 12 - 28   27   Sodium 135 - 145 mmol/L 141  142  144   Potassium 3.5 - 5.1 mmol/L 4.1  4.0  4.6   Chloride 98 - 111 mmol/L 96  103  101   CO2 22 - 32 mmol/L 36  32  27   Calcium 8.9 - 10.3 mg/dL 8.6  8.8  9.4     Past Medical History:  Diagnosis Date   Allergy, unspecified not elsewhere classified    Anxiety    Cataract    Chronic diastolic congestive heart failure (HCC)    COPD (chronic obstructive pulmonary disease) ([place]) 03/08/1998   PFTs 12/12/2002 FEV 1 1.42 (64%) ratio 58 with no better after B2 and DLCO75%; PFTs 11/20/09 FEV1 1.50 (73%) ratio 50 no better after B2 with DLCO 62%; Hfa 50% 11/20/2009 >75%, 01/13/10 p coaching   Depression 12/07/1998   Diabetes mellitus type II 02/05/2006   Dr. Cruzita Lederer with endo   Disorders of  bursae and tendons in shoulder region, unspecified    Rotator cuff syndrome, right   E. coli bacteremia    Esophagitis    GERD (gastroesophageal reflux disease)    History of UTI    Hyperlipidemia 10/04/2000   Hypertension 03/08/1992   Hypothyroidism 03/08/1968   Iron deficiency anemia    Lung nodule    radiation starts 10-08-2019   Malignant neoplasm of right upper lobe of lung ([place]) 07/24/2019   Obesity    NOS   OSA (obstructive sleep apnea)    PSG 01/27/10 AHI 13, pt does not know CPAP settings   Peripheral neuropathy    Likely due to DM per Dr. Murvin Natal hernia     ASSESSMENT 87 year old female with PMH HTN, CAD, PAD, T2DM (03/2022 A1c 5.3%) who presents to the HF clinic as a new patient. Most recent ECHO in 02/2021 shows EF 55-60%. Regarding GDMT, patient is not taking any. Patient does take torsemide (and potassium for repletion) for fluid management. Patient is slightly below target weight of 230, however, patient states her true target weight is much lower and is still having trouble with fluid management. Patient's right leg is significantly more edematous than  her left. Serum creatinine has been trending up, from 0.94 >> 1.05 >> 1.36 since 02/23/2022. Patient is followed by cardiology.  Recent ED Visit (past 6 months): Date - 02/2022, CC - lower back pain Date - 02/2022, CC - peripheral edema Date - 11/2021. CC - puncture wound  PLAN CHF/HTN Reconciled medications with patient, and daughter who was on the phone. Recommend continuing torsemide and potassium 10 mEq for fluid management. Reassess therapy after ECHO and new labs scheduled for 03/2022. Recent serum creatinine trend is problematic for initiation of SGLT2i or MRA without newer labs to show [place] evidence to the contrary. Continue Imdur 30 mg daily.  HLD Continue rosuvastatin 10 mg and ezetimibe 10 mg  T2DM Continue metformin 500 mg daily   Time spent: 20 minutes  Will M. Dareen Piano, PharmD PGY-1 Pharmacy  Resident 03/19/2022 7:59 AM   Current Outpatient Medications:    albuterol (PROAIR HFA) 108 (90 Base) MCG/ACT inhaler, Inhale 2 puffs into the lungs every 6 (six) hours as needed for wheezing or shortness of breath. INHALE 2 PUFFS INTO THE LUNGS EVERY 6 HOURS AS NEEDED FOR WHEEZING OR SHORTNESS OF BREATH, Disp: 3 Inhaler, Rfl: 3   arformoterol (BROVANA) 15 MCG/2ML NEBU, Take 2 mLs (15 mcg total) by nebulization at bedtime. (Patient taking differently: Take 15 mcg by nebulization every evening.), Disp: 120 mL, Rfl: 6   budesonide (PULMICORT) 0.5 MG/2ML nebulizer solution, Take 2 mLs (0.5 mg total) by nebulization in the morning and at bedtime. (Patient taking differently: Take 0.5 mg by nebulization 2 (two) times daily.), Disp: 360 mL, Rfl: 3   busPIRone (BUSPAR) 15 MG tablet, Take 1 tablet (15 mg total) by mouth 2 (two) times daily. With extra 1/2 tab midday, Disp: , Rfl:    Cholecalciferol (VITAMIN D) 50 MCG (2000 UT) tablet, Take 1 tablet (2,000 Units total) by mouth daily., Disp: , Rfl:    Coenzyme Q-10 200 MG CAPS, Take 200 mg by mouth daily., Disp: , Rfl:    Cyanocobalamin (B-12) 5000 MCG CAPS, Take 5,000 mcg by mouth daily., Disp: , Rfl:    cyclobenzaprine (FLEXERIL) 10 MG tablet, Take 0.5-1 tablets (5-10 mg total) by mouth 2 (two) times daily as needed for muscle spasms. (Patient taking differently: Take 5 mg by mouth at bedtime as needed for muscle spasms.), Disp: 30 tablet, Rfl: 0   esomeprazole (NEXIUM) 40 MG capsule, Take 1 capsule (40 mg total) by mouth daily at 12 noon., Disp: 30 capsule, Rfl: 11   ezetimibe (ZETIA) 10 MG tablet, TAKE 1 TABLET BY MOUTH EVERYDAY AT BEDTIME, Disp: 90 tablet, Rfl: 2   fexofenadine (ALLEGRA) 180 MG tablet, Take 180 mg by mouth as needed. , Disp: , Rfl:    FLUoxetine (PROZAC) 20 MG tablet, Take 20 mg by mouth 2 (two) times daily., Disp: , Rfl:    fluticasone (FLONASE) 50 MCG/ACT nasal spray, USE 2 SPRAYS INTO THE NOSE EVERY 12 HOURS AS NEEDED FOR STUFFY NOSE,  Disp: 48 mL, Rfl: 2   glucose blood (ONETOUCH VERIO) test strip, USE AS INSTRUCTED TO CHECK SUGAR 1 TIME DAILY, Disp: 100 strip, Rfl: 3   guaiFENesin (MUCINEX) 600 MG 12 hr tablet, Take 2 tablets (1,200 mg total) by mouth 2 (two) times daily as needed for cough or to loosen phlegm., Disp: , Rfl:    Iron, Ferrous Sulfate, 325 (65 Fe) MG TABS, Take 325 mg by mouth daily., Disp: 90 tablet, Rfl: 3   isosorbide mononitrate (IMDUR) 30 MG 24 hr tablet, Take  1 tablet (30 mg total) by mouth daily., Disp: 90 tablet, Rfl: 3   levothyroxine (SYNTHROID) 125 MCG tablet, Take 1 tablet (125 mcg total) by mouth daily., Disp: 90 tablet, Rfl: 3   lidocaine (LIDODERM) 5 %, Place 1 patch onto the skin every 12 (twelve) hours. Remove & Discard patch within 12 hours or as directed by MD, Disp: 10 patch, Rfl: 0   metFORMIN (GLUCOPHAGE) 500 MG tablet, Take 1 tablet (500 mg total) by mouth every evening., Disp: 90 tablet, Rfl: 3   nitroGLYCERIN (NITROSTAT) 0.4 MG SL tablet, Place 1 tablet (0.4 mg total) under the tongue every 5 (five) minutes as needed for chest pain., Disp: 25 tablet, Rfl: 3   OneTouch Delica Lancets 33G MISC, Use 1x a day, Disp: 100 each, Rfl: 3   potassium chloride (KLOR-CON) 10 MEQ tablet, Take 1 tablet (10 mEq total) by mouth daily., Disp: 90 tablet, Rfl: 3   rosuvastatin (CRESTOR) 10 MG tablet, Take 1 tablet (10 mg total) by mouth every other day., Disp: 45 tablet, Rfl: 2   Spacer/Aero Chamber Mouthpiece MISC, Use with  inhaler as needed.  J44.9, Disp: 1 each, Rfl: 0   torsemide (DEMADEX) 20 MG tablet, Take 1 tablet (20 mg total) by mouth daily., Disp: 90 tablet, Rfl: 1   COUNSELING POINTS/CLINICAL PEARLS  DRUGS TO CAUTION IN HEART FAILURE  Drug or Class Mechanism  Analgesics NSAIDs COX-2 inhibitors Glucocorticoids  Sodium and water retention, increased systemic vascular resistance, decreased response to diuretics   Diabetes Medications Metformin Thiazolidinediones Rosiglitazone  (Avandia) Pioglitazone (Actos) DPP4 Inhibitors Saxagliptin (Onglyza) Sitagliptin (Januvia)   Lactic acidosis Possible calcium channel blockade   Unknown  Antiarrhythmics Class I  Flecainide Disopyramide Class III Sotalol Other Dronedarone  Negative inotrope, proarrhythmic   Proarrhythmic, beta blockade  Negative inotrope  Antihypertensives Alpha Blockers Doxazosin Calcium Channel Blockers Diltiazem Verapamil Nifedipine Central Alpha Adrenergics Moxonidine Peripheral Vasodilators Minoxidil  Increases renin and aldosterone  Negative inotrope    Possible sympathetic withdrawal  Unknown  Anti-infective Itraconazole Amphotericin B  Negative inotrope Unknown  Hematologic Anagrelide Cilostazol   Possible inhibition of PD IV Inhibition of PD III causing arrhythmias  Neurologic/Psychiatric Stimulants Anti-Seizure Drugs Carbamazepine Pregabalin Antidepressants Tricyclics Citalopram Parkinsons Bromocriptine Pergolide Pramipexole Antipsychotics Clozapine Antimigraine Ergotamine Methysergide Appetite suppressants Bipolar Lithium  Peripheral alpha and beta agonist activity  Negative inotrope and chronotrope Calcium channel blockade  Negative inotrope, proarrhythmic Dose-dependent QT prolongation  Excessive serotonin activity/valvular damage Excessive serotonin activity/valvular damage Unknown  IgE mediated hypersensitivy, calcium channel blockade  Excessive serotonin activity/valvular damage Excessive serotonin activity/valvular damage Valvular damage  Direct myofibrillar degeneration, adrenergic stimulation  Antimalarials Chloroquine Hydroxychloroquine Intracellular inhibition of lysosomal enzymes  Urologic Agents Alpha Blockers Doxazosin Prazosin Tamsulosin Terazosin  Increased renin and aldosterone  Adapted from Page Williemae Natter, et al. "Drugs That May Cause or Exacerbate Heart Failure: A Scientific Statement from the American Heart   Association." Circulation 2016; 134:e32-e69. DOI: 10.1161/CIR.0000000000000426   MEDICATION ADHERENCES TIPS AND STRATEGIES Taking medication as prescribed improves patient outcomes in heart failure (reduces hospitalizations, improves symptoms, increases survival) Side effects of medications can be managed by decreasing doses, switching agents, stopping drugs, or adding additional therapy. Please let someone in the Heart Failure Clinic know if you have having bothersome side effects so we can modify your regimen. Do not alter your medication regimen without talking to [place].  Medication reminders can help patients remember to take drugs on time. If you are missing or forgetting doses you can try linking behaviors, using pill  boxes, or an electronic reminder like an alarm on your phone or an app. Some people can also get automated phone calls as medication reminders.

## 2022-03-22 ENCOUNTER — Ambulatory Visit
Admission: RE | Admit: 2022-03-22 | Discharge: 2022-03-22 | Disposition: A | Discharge status: Home / Self Care | Payer: Medicare Other | Source: Ambulatory Visit | Attending: Radiation Oncology | Admitting: Radiation Oncology

## 2022-03-22 ENCOUNTER — Encounter: Payer: Self-pay | Admitting: Radiation Oncology

## 2022-03-22 DIAGNOSIS — C3411 Malignant neoplasm of upper lobe, right bronchus or lung: Secondary | ICD-10-CM | POA: Diagnosis not present

## 2022-03-22 DIAGNOSIS — Z87891 Personal history of nicotine dependence: Secondary | ICD-10-CM | POA: Diagnosis not present

## 2022-03-22 NOTE — Progress Notes (Addendum)
Radiation Oncology         [phone] ________________________________   Name: Alexandria [name]        MRN: [id number]  Date of Service: 03/22/2022 DOB: Jul 02, 1935  OC:[id number], Alexandria Curd, MD  Alexandria Igo, DO     REFERRING PHYSICIAN: Josephine Igo, DO   DIAGNOSIS: There were no encounter diagnoses.   HISTORY OF PRESENT ILLNESS: Alexandria [name] is a 87 y.o. female with a history of putative Stage IA3, cT1cN0M0, NSCLC of the RUL. The patient has a history of Oxygen Dependant COPD at 2-3 L [place] several years.  She had been followed as well with nodules in both lungs since about 2017. CT imaging of the chest on 06/13/19 revealed an increase in size in a dominant lesion in the apical RUL. This measured 2.2 x 1.2 cm, she had other stable nodules. PET imaging on 07/18/19 did reveal increased metabolic activity in the RUL lesion with an SUV of 2.2 despite blood pool of 3.32. She was counseled on the option of bronchoscopy for diagnosis of the presumed early stage lung cancer but was not interested in having any invasive procedures, or surgery. As a result, she proceeded with stereotactic body radiotherapy (SBRT) which she completed in August 2021.   The patient is continued to be without residual disease. She's had scans that have shown  evolving changes in the postradiation fibrosis of the apex of the right upper lobe. Persistent atherosclerotic disease as well as calcifications of the aortic valve and mitral annulus continue to be noted. She also has chronic changes in the liver consistent with cavernous hemangioma as well as stable changes in the lateral aspect of the left seventh rib which radiology feels likely represents a bone island.    Scans in May 2023 showed changes of a right pleural effusion and nodularity at the right lung base which  resolved with shorter interval imaging. She has had a right 3rd rib fracture as well as stable changes of hemangioma in the liver, atherosclerosis,  calcifications of heart valves and post radiation fibrosis in the RUL. Her most recent scan on 03/19/22 showed 2 tiny new nodules measuring 2 [name] in greatest dimension in the right upper lobe, but stable post radiation changes otherwise in the RUL. She also has a T5 compression fracture that was new since her last scan.    PREVIOUS RADIATION THERAPY:    10/11/19 - 10/18/19 SBRT: The patient was treated to the right upper lobe lung with a course of stereotactic body radiation treatment.  The patient received 54 Gray in 3 fractions using a IMRT/SBRT technique, with 3 fields.  PAST MEDICAL HISTORY:  Past Medical History:  Diagnosis Date   Allergy, unspecified not elsewhere classified    Anxiety    Cataract    Chronic diastolic congestive heart failure (HCC)    COPD (chronic obstructive pulmonary disease) (HCC) 03/08/1998   PFTs 12/12/2002 FEV 1 1.42 (64%) ratio 58 with no better after B2 and DLCO75%; PFTs 11/20/09 FEV1 1.50 (73%) ratio 50 no better after B2 with DLCO 62%; Hfa 50% 11/20/2009 >75%, 01/13/10 p coaching   Depression 12/07/1998   Diabetes mellitus type II 02/05/2006   Dr. Elvera [name] with [name]   Disorders of bursae and tendons in shoulder region, unspecified    Rotator cuff syndrome, right   E. coli bacteremia    Esophagitis    GERD (gastroesophageal reflux disease)    History of UTI    Hyperlipidemia 10/04/2000   Hypertension  03/08/1992   Hypothyroidism 03/08/1968   Iron deficiency anemia    Lung nodule    radiation starts 10-08-2019   Malignant neoplasm of right upper lobe of lung (HCC) 07/24/2019   Obesity    NOS   OSA (obstructive sleep apnea)    PSG 01/27/10 AHI 13, pt does not know CPAP settings   Peripheral neuropathy    Likely due to DM per Dr. Tresa [name] hernia        PAST SURGICAL HISTORY: Past Surgical History:  Procedure Laterality Date   ABD U/S  03/19/1999   Nml x2 foci in liver   ADENOSINE MYOVIEW  06/02/2007   Nml   CARDIOLITE PERSANTINE  08/24/2000    Nml   CAROTID U/S  08/24/2000   1-39% ICA stenosis   CAROTID U/S  06/02/2007   No apprec change    CARPAL TUNNEL RELEASE  12/1997   Right   CESAREAN SECTION     x2 [place]/ repeat   CHOLECYSTECTOMY  1997   COLONOSCOPY WITH PROPOFOL N/A 02/12/2016   Procedure: COLONOSCOPY WITH PROPOFOL;  Surgeon: Alexandria Fee, MD;  Location: [facility];  Service: Endoscopy;  Laterality: N/A;   CT ABD W & PELVIS WO/W CM     Abd hemangiomas of liver, 1 cm R renal cyst   DENTAL SURGERY  2016   Implants   DEXA  07/03/2003   Nml   ESOPHAGOGASTRODUODENOSCOPY  12/05/1997   Nml (due to hoarseness)   ESOPHAGOGASTRODUODENOSCOPY (EGD) WITH PROPOFOL N/A 02/12/2016   Procedure: ESOPHAGOGASTRODUODENOSCOPY (EGD) WITH PROPOFOL;  Surgeon: Alexandria Fee, MD;  Location: [facility];  Service: Endoscopy;  Laterality: N/A;   GALLBLADDER SURGERY     HERNIA REPAIR  01/24/2009   Lap Ventr w/ Lysis of adhesions (Dr. Dwain [name])   knee arthroscopic surgery  years ago   right   ROTATOR CUFF REPAIR  1984   Right, Alexandria [name]   SHOULDER OPEN ROTATOR CUFF REPAIR  02/08/2012   Procedure: ROTATOR CUFF REPAIR SHOULDER OPEN;  Surgeon: Alexandria Schmidt, MD;  Location: [facility];  Service: Orthopedics;  Laterality: Left;  Left Shoulder Open Anterior Acrominectomy Rotator Cuff Repair Open Distal Clavicle Resection ,tissue mend graft, and repair of biceps tendon   THUMB RELEASE  12/1997   Right   TONSILLECTOMY     TOTAL ABDOMINAL HYSTERECTOMY  1985   Due to dysmennorhea   US ECHOCARDIOGRAPHY  06/02/2007     FAMILY HISTORY:  Family History  Problem Relation Age of Onset   Heart disease Mother    Thyroid disease Mother    Emphysema Father        One lung   Cystic fibrosis Sister    Hyperthyroidism Sister    Osteoarthritis Brother    Hyperthyroidism Brother    Esophageal cancer Neg Hx    Cancer Neg Hx        Head or neck   Colon cancer Neg Hx    Stomach cancer Neg Hx    Breast cancer Neg Hx      SOCIAL HISTORY:   reports that she quit smoking about 17 years ago. Her smoking use included cigarettes. She has a 100.00 pack-year smoking history. She has never used smokeless tobacco. She reports current alcohol use. She reports that she does not use drugs. The patient is widowed and lives in [place]. She is often helped by her daughter [name] who lives in [place].   ALLERGIES: Antihistamines, diphenhydramine-type; Sulfonamide derivatives; Incruse ellipta [umeclidinium bromide]; Clarithromycin; Codeine;  Lyrica [pregabalin]; Spiriva handihaler [tiotropium bromide monohydrate]; and Vraylar [cariprazine]   MEDICATIONS:  Current Outpatient Medications  Medication Sig Dispense Refill   albuterol (PROAIR HFA) 108 (90 Base) MCG/ACT inhaler Inhale 2 puffs into the lungs every 6 (six) hours as needed for wheezing or shortness of breath. INHALE 2 PUFFS INTO THE LUNGS EVERY 6 HOURS AS NEEDED FOR WHEEZING OR SHORTNESS OF BREATH 3 Inhaler 3   arformoterol (BROVANA) 15 MCG/2ML NEBU Take 2 mLs (15 mcg total) by nebulization at bedtime. (Patient taking differently: Take 15 mcg by nebulization every evening.) 120 mL 6   budesonide (PULMICORT) 0.5 MG/2ML nebulizer solution Take 2 mLs (0.5 mg total) by nebulization in the morning and at bedtime. (Patient taking differently: Take 0.5 mg by nebulization 2 (two) times daily.) 360 mL 3   busPIRone (BUSPAR) 15 MG tablet Take 1 tablet (15 mg total) by mouth 2 (two) times daily. With extra 1/2 tab midday     Cholecalciferol (VITAMIN D) 50 MCG (2000 UT) tablet Take 1 tablet (2,000 Units total) by mouth daily.     Coenzyme Q-10 200 MG CAPS Take 200 mg by mouth daily.     Cyanocobalamin (B-12) 5000 MCG CAPS Take 5,000 mcg by mouth daily.     cyclobenzaprine (FLEXERIL) 10 MG tablet Take 0.5-1 tablets (5-10 mg total) by mouth 2 (two) times daily as needed for muscle spasms. (Patient taking differently: Take 5 mg by mouth at bedtime as needed for muscle spasms.) 30 tablet 0   esomeprazole  (NEXIUM) 40 MG capsule Take 1 capsule (40 mg total) by mouth daily at 12 noon. 30 capsule 11   ezetimibe (ZETIA) 10 MG tablet TAKE 1 TABLET BY MOUTH EVERYDAY AT BEDTIME 90 tablet 2   fexofenadine (ALLEGRA) 180 MG tablet Take 180 mg by mouth as needed.      FLUoxetine (PROZAC) 20 MG tablet Take 20 mg by mouth 2 (two) times daily.     fluticasone (FLONASE) 50 MCG/ACT nasal spray USE 2 SPRAYS INTO THE NOSE EVERY 12 HOURS AS NEEDED FOR STUFFY NOSE 48 mL 2   glucose blood (ONETOUCH VERIO) test strip USE AS INSTRUCTED TO CHECK SUGAR 1 TIME DAILY 100 strip 3   guaiFENesin (MUCINEX) 600 MG 12 hr tablet Take 2 tablets (1,200 mg total) by mouth 2 (two) times daily as needed for cough or to loosen phlegm.     Iron, Ferrous Sulfate, 325 (65 Fe) MG TABS Take 325 mg by mouth daily. 90 tablet 3   isosorbide mononitrate (IMDUR) 30 MG 24 hr tablet Take 1 tablet (30 mg total) by mouth daily. 90 tablet 3   levothyroxine (SYNTHROID) 125 MCG tablet Take 1 tablet (125 mcg total) by mouth daily. 90 tablet 3   lidocaine (LIDODERM) 5 % Place 1 patch onto the skin every 12 (twelve) hours. Remove & Discard patch within 12 hours or as directed by MD 10 patch 0   metFORMIN (GLUCOPHAGE) 500 MG tablet Take 1 tablet (500 mg total) by mouth every evening. 90 tablet 3   nitroGLYCERIN (NITROSTAT) 0.4 MG SL tablet Place 1 tablet (0.4 mg total) under the tongue every 5 (five) minutes as needed for chest pain. 25 tablet 3   OneTouch Delica Lancets 33G MISC Use 1x a day 100 each 3   potassium chloride (KLOR-CON) 10 MEQ tablet Take 1 tablet (10 mEq total) by mouth daily. 90 tablet 3   rosuvastatin (CRESTOR) 10 MG tablet Take 1 tablet (10 mg total) by mouth every other day.  45 tablet 2   Spacer/Aero Chamber Mouthpiece MISC Use with  inhaler as needed.  J44.9 1 each 0   torsemide (DEMADEX) 20 MG tablet Take 1 tablet (20 mg total) by mouth daily. 90 tablet 1   No current facility-administered medications for this encounter.     REVIEW  OF SYSTEMS: On review of systems, the patient reports that she is doing okay overall with stable productive cough that she uses mucinex occasionally for. She states there were concerns about taking this after meeting with pharmacy a few days ago. She continues to have shortness of breath occasionally at rest and she continues with O2 a 2 1/2 L to 3L of via [place]. She usually uses 3 L when she's up and moving. She reports mid thoracic back pain below the level of the scapula at times but this is not constant, and mostly worsened with cough also along the chest wall. She has some discomfort that comes and goes as well in the lower back below the level of her natural waist without any progressive symptoms. She is hopeful this is musculoskeletal in nature. No other complaints are verbalized.    PHYSICAL EXAM:  Unable to assess due to encounter type.   ECOG = 1  0 - Asymptomatic (Fully active, able to carry on all predisease activities without restriction)  1 - Symptomatic but completely ambulatory (Restricted in physically strenuous activity but ambulatory and able to carry out work of a light or sedentary nature. For example, light housework, office work)  2 - Symptomatic, <50% in bed during the day (Ambulatory and capable of all self care but unable to carry out any work activities. Up and about more than 50% of waking hours)  3 - Symptomatic, >50% in bed, but not bedbound (Capable of only limited self-care, confined to bed or chair 50% or more of waking hours)  4 - Bedbound (Completely disabled. Cannot carry on any self-care. Totally confined to bed or chair)  5 - Death   Alexandria [name], Alexandria [name], Alexandria [name], et al. [phone]). "Toxicity and response criteria of the [employer]". Am. Evlyn Clines. Oncol. 5 (6): 649-55       IMPRESSION/PLAN: 1. Putative Stage IA3, cT1cN0M0, NSCLC of the RUL. Her imaging findings in the RUL are stable in the area of her prior therapy. We discussed the two  new RUL nodules and would plan a repeat scan in about 3-4 months time. The patient and her daughter were in agreement with this plan. If stable on her next scan, we can consider moving back to 6 month intervals. 2. RUL nodules. As per #1 we will follow up with a shorter interval scan in 4 months per #1.  3. Right posterior 3rd rib fracture and newly noted T5 vertebral compression fracture. While her rib fracture is likely related to prior radiation, it would not be anticipated to cause vertebral body fracture, but we discussed possible evaluation with interventional radiology to discuss kyphoplasty and consideration of injection to the rib space with interventional pain management. At this time the patient would like to follow her symptoms expectantly but will discuss further with Dr. Para March. 4. O2 dependent COPD. The patient will continue with Dr. Craige Cotta for follow up and we will follow her course expectantly. I encouraged her to check in with her cardiologist as well to see if they are comfortable with her using daily mucinex since there was recent concerns for ongoing use of this by pharmacy in their office.  5. Iron deficiency anemia. She will follow up with Dr. Donneta Romberg as well with Venofer.     Given current concerns for patient exposure during the COVID-19 pandemic, this encounter was conducted via telephone.  The patient has provided two factor identification and has given verbal consent for this type of encounter and has been advised to only accept a meeting of this type in a secure network environment. The time spent during this encounter was 40 minutes including preparation, discussion, and coordination of the patient's care. The attendants for this meeting includeAlison Elvis [name]  and Alexandria [name] and her daughter Alexandria [name]. During the encounter,  Alexandria [name] was located at [facility] Radiation Oncology Department.  Alexandria [name] was located at home,  her daughter Alexandria [name] was located at her own home.     Osker Mason, PAC

## 2022-03-22 NOTE — Progress Notes (Addendum)
Telephone nursing interview for patient to receive most recent CT results from 03/19/22.  I verified patient's identity and began nursing interview. Patient repots lower back pain 8/10 and persistent SOB (copd). No other issues conveyed at this time.  Meaningful use complete.  Patient aware of her 2:30pm-03/22/22 telephone appointment w/ Laurence Aly PA-C. I left my extension [phone] in case patient needs anything.  This concludes the interview.   Ruel Favors, LPN

## 2022-03-24 ENCOUNTER — Inpatient Hospital Stay: Payer: Medicare Other

## 2022-03-24 ENCOUNTER — Ambulatory Visit: Payer: Medicare Other

## 2022-03-24 VITALS — BP 159/64 | HR 72 | Temp 96.2°F | Resp 18

## 2022-03-24 DIAGNOSIS — Z87891 Personal history of nicotine dependence: Secondary | ICD-10-CM | POA: Diagnosis not present

## 2022-03-24 DIAGNOSIS — D509 Iron deficiency anemia, unspecified: Secondary | ICD-10-CM | POA: Diagnosis not present

## 2022-03-24 DIAGNOSIS — D649 Anemia, unspecified: Secondary | ICD-10-CM

## 2022-03-24 DIAGNOSIS — J449 Chronic obstructive pulmonary disease, unspecified: Secondary | ICD-10-CM | POA: Diagnosis not present

## 2022-03-24 DIAGNOSIS — Z79899 Other long term (current) drug therapy: Secondary | ICD-10-CM | POA: Diagnosis not present

## 2022-03-24 DIAGNOSIS — Z9981 Dependence on supplemental oxygen: Secondary | ICD-10-CM | POA: Diagnosis not present

## 2022-03-24 DIAGNOSIS — Z85118 Personal history of other malignant neoplasm of bronchus and lung: Secondary | ICD-10-CM | POA: Diagnosis not present

## 2022-03-24 MED ORDER — SODIUM CHLORIDE 0.9 % IV SOLN
Freq: Once | INTRAVENOUS | Status: AC
  Filled 2022-03-24: qty 250

## 2022-03-24 MED ORDER — METHYLPREDNISOLONE SODIUM SUCC 125 MG IJ SOLR
40.0000 mg | Freq: Once | INTRAMUSCULAR | Status: AC
  Administered 2022-03-24: 40 mg via INTRAVENOUS

## 2022-03-24 MED ORDER — SODIUM CHLORIDE 0.9 % IV SOLN
200.0000 mg | Freq: Once | INTRAVENOUS | Status: AC
  Administered 2022-03-24: 200 mg via INTRAVENOUS
  Filled 2022-03-24: qty 200

## 2022-03-24 NOTE — Patient Instructions (Signed)
Iron Sucrose Injection What is this medication? IRON SUCROSE (EYE ern SOO krose) treats low levels of iron (iron deficiency anemia) in people with kidney disease. Iron is a mineral that plays an important role in making red blood cells, which carry oxygen from your lungs to the rest of your body. This medicine may be used for other purposes; ask your health care provider or pharmacist if you have questions. COMMON BRAND NAME(S): Venofer What should I tell my care team before I take this medication? They need to know if you have any of these conditions: Anemia not caused by low iron levels Heart disease High levels of iron in the blood Kidney disease Liver disease An unusual or allergic reaction to iron, other medications, foods, dyes, or preservatives Pregnant or trying to get pregnant Breastfeeding How should I use this medication? This medication is for infusion into a vein. It is given in a hospital or clinic setting. Talk to your care team about the use of this medication in children. While this medication may be prescribed for children as young as 2 years for selected conditions, precautions do apply. Overdosage: If you think you have taken too much of this medicine contact a poison control center or emergency room at once. NOTE: This medicine is only for you. Do not share this medicine with others. What if I miss a dose? Keep appointments for follow-up doses. It is important not to miss your dose. Call your care team if you are unable to keep an appointment. What may interact with this medication? Do not take this medication with any of the following: Deferoxamine Dimercaprol Other iron products This medication may also interact with the following: Chloramphenicol Deferasirox This list may not describe all possible interactions. Give your health care provider a list of all the medicines, herbs, non-prescription drugs, or dietary supplements you use. Also tell them if you smoke,  drink alcohol, or use illegal drugs. Some items may interact with your medicine. What should I watch for while using this medication? Visit your care team regularly. Tell your care team if your symptoms do not start to get better or if they get worse. You may need blood work done while you are taking this medication. You may need to follow a special diet. Talk to your care team. Foods that contain iron include: whole grains/cereals, dried fruits, beans, or peas, leafy green vegetables, and organ meats (liver, kidney). What side effects may I notice from receiving this medication? Side effects that you should report to your care team as soon as possible: Allergic reactions--skin rash, itching, hives, swelling of the face, lips, tongue, or throat Low blood pressure--dizziness, feeling faint or lightheaded, blurry vision Shortness of breath Side effects that usually do not require medical attention (report to your care team if they continue or are bothersome): Flushing Headache Joint pain Muscle pain Nausea Pain, redness, or irritation at injection site This list may not describe all possible side effects. Call your doctor for medical advice about side effects. You may report side effects to FDA at 1-800-FDA-1088. Where should I keep my medication? This medication is given in a hospital or clinic and will not be stored at home. NOTE: This sheet is a summary. It may not cover all possible information. If you have questions about this medicine, talk to your doctor, pharmacist, or health care provider.  2023 Elsevier/Gold Standard (2020-06-05 00:00:00)

## 2022-03-29 ENCOUNTER — Ambulatory Visit: Payer: Medicare Other | Attending: Cardiology | Admitting: Cardiology

## 2022-03-29 ENCOUNTER — Encounter: Payer: Self-pay | Admitting: Cardiology

## 2022-03-29 ENCOUNTER — Other Ambulatory Visit: Payer: Medicare Other

## 2022-03-29 ENCOUNTER — Ambulatory Visit
Admission: RE | Admit: 2022-03-29 | Discharge: 2022-03-29 | Disposition: A | Discharge status: Home / Self Care | Payer: Medicare Other | Source: Ambulatory Visit | Attending: Physician Assistant | Admitting: Physician Assistant

## 2022-03-29 VITALS — BP 159/76 | HR 79 | Ht 65.0 in | Wt 232.8 lb

## 2022-03-29 DIAGNOSIS — I2584 Coronary atherosclerosis due to calcified coronary lesion: Secondary | ICD-10-CM | POA: Insufficient documentation

## 2022-03-29 DIAGNOSIS — E119 Type 2 diabetes mellitus without complications: Secondary | ICD-10-CM | POA: Insufficient documentation

## 2022-03-29 DIAGNOSIS — I5032 Chronic diastolic (congestive) heart failure: Secondary | ICD-10-CM | POA: Insufficient documentation

## 2022-03-29 DIAGNOSIS — I110 Hypertensive heart disease with heart failure: Secondary | ICD-10-CM | POA: Diagnosis not present

## 2022-03-29 DIAGNOSIS — E782 Mixed hyperlipidemia: Secondary | ICD-10-CM | POA: Insufficient documentation

## 2022-03-29 DIAGNOSIS — R011 Cardiac murmur, unspecified: Secondary | ICD-10-CM | POA: Insufficient documentation

## 2022-03-29 DIAGNOSIS — J449 Chronic obstructive pulmonary disease, unspecified: Secondary | ICD-10-CM | POA: Insufficient documentation

## 2022-03-29 DIAGNOSIS — Z79899 Other long term (current) drug therapy: Secondary | ICD-10-CM | POA: Insufficient documentation

## 2022-03-29 DIAGNOSIS — I2510 Atherosclerotic heart disease of native coronary artery without angina pectoris: Secondary | ICD-10-CM | POA: Insufficient documentation

## 2022-03-29 DIAGNOSIS — J4489 Other specified chronic obstructive pulmonary disease: Secondary | ICD-10-CM | POA: Insufficient documentation

## 2022-03-29 DIAGNOSIS — I10 Essential (primary) hypertension: Secondary | ICD-10-CM | POA: Insufficient documentation

## 2022-03-29 DIAGNOSIS — J439 Emphysema, unspecified: Secondary | ICD-10-CM | POA: Insufficient documentation

## 2022-03-29 LAB — ECHOCARDIOGRAM COMPLETE
AR max vel: 1.78 cm2
AV Area VTI: 2.21 cm2
AV Area mean vel: 1.68 cm2
AV Mean grad: 10 mmHg
AV Peak grad: 18 mmHg
Ao pk vel: 2.12 m/s
Area-P 1/2: 2.07 cm2
MV VTI: 2.82 cm2
S' Lateral: 2.5 cm

## 2022-03-29 MED ORDER — TORSEMIDE 10 MG PO TABS: 10.0000 mg | ORAL_TABLET | ORAL | 4 refills | Status: DC | PRN

## 2022-03-29 MED ORDER — TORSEMIDE 20 MG PO TABS: 20.0000 mg | ORAL_TABLET | ORAL | 3 refills | Status: DC | PRN

## 2022-03-29 NOTE — Progress Notes (Signed)
*  PRELIMINARY RESULTS* Echocardiogram 2D Echocardiogram has been performed.  Cristela Blue 03/29/2022, 11:49 AM

## 2022-03-29 NOTE — Patient Instructions (Signed)
Medication Instructions:  Your physician recommends the following medication changes.  DECREASE: Torsemide to 10 mg daily as needed for weight increase (follow previous sliding scale)  *If you need a refill on your cardiac medications before your next appointment, please call your pharmacy*   Lab Work: Your provider would like for you to return in 1 week to have the following labs drawn: (CMP).   Please go to the [facility] entrance and check in at the front desk.  You do not need an appointment.  They are open from 7am-6 pm.  You will not need to be fasting.   If you have labs (blood work) drawn today and your tests are completely normal, you will receive your results only by: MyChart Message (if you have MyChart) OR A paper copy in the mail If you have any lab test that is abnormal or we need to change your treatment, we will call you to review the results.   Testing/Procedures: None ordered today   Follow-Up: At [facility], you and your health needs are our priority.  As part of our continuing mission to provide you with exceptional heart care, we have created designated Provider Care Teams.  These Care Teams include your primary Cardiologist (physician) and Advanced Practice Providers (APPs -  Physician Assistants and Nurse Practitioners) who all work together to provide you with the care you need, when you need it.  We recommend signing up for the patient portal called "MyChart".  Sign up information is provided on this After Visit Summary.  MyChart is used to connect with patients for Virtual Visits (Telemedicine).  Patients are able to view lab/test results, encounter notes, upcoming appointments, etc.  Non-urgent messages can be sent to your provider as well.   To learn more about what you can do with MyChart, go to [email or web address].    Your next appointment:   4 week(s)  Provider:   Julien Nordmann, MD

## 2022-03-29 NOTE — Progress Notes (Signed)
Cardiology Clinic Note   Patient Name: Alexandria [name] Date of Encounter: 03/29/2022  Primary Care Provider:  Joaquim Nam, MD Primary Cardiologist:  None  Patient Profile    87 year old female with a past medical history of coronary artery calcification on CT, HFpEF, peripheral arterial disease, diabetes, chronic kidney disease, hypertension with orthostatic hypotension, hyperlipidemia, chronic obstructive pulmonary disease, former smoker, lung cancer, obesity, OSA, and peripheral edema, who is here today for follow-up.  Past Medical History    Past Medical History:  Diagnosis Date   Allergy, unspecified not elsewhere classified    Anxiety    Cataract    Chronic diastolic congestive heart failure (HCC)    COPD (chronic obstructive pulmonary disease) (HCC) 03/08/1998   PFTs 12/12/2002 FEV 1 1.42 (64%) ratio 58 with no better after B2 and DLCO75%; PFTs 11/20/09 FEV1 1.50 (73%) ratio 50 no better after B2 with DLCO 62%; Hfa 50% 11/20/2009 >75%, 01/13/10 p coaching   Depression 12/07/1998   Diabetes mellitus type II 02/05/2006   Dr. Elvera Lennox with endo   Disorders of bursae and tendons in shoulder region, unspecified    Rotator cuff syndrome, right   E. coli bacteremia    Esophagitis    GERD (gastroesophageal reflux disease)    History of UTI    Hyperlipidemia 10/04/2000   Hypertension 03/08/1992   Hypothyroidism 03/08/1968   Iron deficiency anemia    Lung nodule    radiation starts 10-08-2019   Malignant neoplasm of right upper lobe of lung (HCC) 07/24/2019   Obesity    NOS   OSA (obstructive sleep apnea)    PSG 01/27/10 AHI 13, pt does not know CPAP settings   Peripheral neuropathy    Likely due to DM per Dr. Tresa Endo hernia    Past Surgical History:  Procedure Laterality Date   ABD U/S  03/19/1999   Nml x2 foci in liver   ADENOSINE MYOVIEW  06/02/2007   Nml   CARDIOLITE PERSANTINE  08/24/2000   Nml   CAROTID U/S  08/24/2000   1-39% ICA stenosis   CAROTID  U/S  06/02/2007   No apprec change    CARPAL TUNNEL RELEASE  12/1997   Right   CESAREAN SECTION     x2 [place]/ repeat   CHOLECYSTECTOMY  1997   COLONOSCOPY WITH PROPOFOL N/A 02/12/2016   Procedure: COLONOSCOPY WITH PROPOFOL;  Surgeon: Rachael Fee, MD;  Location: [facility];  Service: Endoscopy;  Laterality: N/A;   CT ABD W & PELVIS WO/W CM     Abd hemangiomas of liver, 1 cm R renal cyst   DENTAL SURGERY  2016   Implants   DEXA  07/03/2003   Nml   ESOPHAGOGASTRODUODENOSCOPY  12/05/1997   Nml (due to hoarseness)   ESOPHAGOGASTRODUODENOSCOPY (EGD) WITH PROPOFOL N/A 02/12/2016   Procedure: ESOPHAGOGASTRODUODENOSCOPY (EGD) WITH PROPOFOL;  Surgeon: Rachael Fee, MD;  Location: [facility];  Service: Endoscopy;  Laterality: N/A;   GALLBLADDER SURGERY     HERNIA REPAIR  01/24/2009   Lap Ventr w/ Lysis of adhesions (Dr. Dwain Sarna)   knee arthroscopic surgery  years ago   right   ROTATOR CUFF REPAIR  1984   Right, [place]   SHOULDER OPEN ROTATOR CUFF REPAIR  02/08/2012   Procedure: ROTATOR CUFF REPAIR SHOULDER OPEN;  Surgeon: Drucilla Schmidt, MD;  Location: [facility];  Service: Orthopedics;  Laterality: Left;  Left Shoulder Open Anterior Acrominectomy Rotator Cuff Repair Open Distal Clavicle Resection ,tissue  mend graft, and repair of biceps tendon   THUMB RELEASE  12/1997   Right   TONSILLECTOMY     TOTAL ABDOMINAL HYSTERECTOMY  1985   Due to dysmennorhea   US ECHOCARDIOGRAPHY  06/02/2007    Allergies  Allergies  Allergen Reactions   Antihistamines, Diphenhydramine-Type Other (See Comments)    Able to tolerate only allegra.    Sulfonamide Derivatives Swelling    REACTION: closed throat   Incruse Ellipta [Umeclidinium Bromide] Cough    Caused voice change and severe coughing   Clarithromycin Other (See Comments)    REACTION: diff swallowing and mouth blisters   Codeine Other (See Comments)    Unknown    Lyrica [Pregabalin] Other (See Comments)    Lack of effect for  neuropathy pain.     Spiriva Handihaler [Tiotropium Bromide Monohydrate] Other (See Comments)    Voice changes   Vraylar [Cariprazine]     intolerant    History of Present Illness    [name] is a 87 year old female with the previously mentioned past medical history of coronary artery calcification on CT of the 2012.  Ischemia, HFpEF LVEF 55 to 60%, diabetes, peripheral arterial disease, chronic kidney disease, hypertension, hyperlipidemia, COPD, former smoker x 50 years, OSA not currently on CPAP, non small cell lung cancer s/p completed radiation and chemo, and chronic peripheral edema with the R>L who was recently changed from furosemide to torsemide.  She was last seen in clinic 02/23/2022 by Richardson Dopp PA-C after recent emergency department visit to [facility] for lower extremity edema.  Her BNP was 257, high-sensitivity troponins negative, venous ultrasound negative for DVT, chest x-ray with no acute disease.  At her visit she had continued complaints of peripheral edema, shortness of breath, and chest discomfort.  She was started on torsemide 20 mg daily with repeat BMP in 1 week, echocardiogram ordered, compression therapy, for chest discomfort she was also started on Imdur 30 mg daily.  She had an additional visit to the [facility] emergency department on 03/04/2022 with back pain and leg pain.  Patient reported achy pain milligrams presents for lower back daughter was also concerned that she may have taken too much diuretic medication since being switched to torsemide from furosemide that she continues to take both of the medications.  Vital signs were stable, pertinent lab hemoglobin of 10, BNP of 88.2, high-sensitivity troponin of 9, serum creatinine 1.05, BUN 25, and a negative respiratory panel.  She returns to clinic today with her daughter on the telephone. She denies any chest pain today but endorses shortness of breath, continued peripheral edema, cough that  sounds congested, and lower back pain since having changes made to her diuretic regimen. She said she has been told that it is a pulled muscle but this has been ongoing to over a month and her daughter voices concern as well. She also had continued with muscle cramping to her hands, legs, and feet.   Home Medications    Current Outpatient Medications  Medication Sig Dispense Refill   albuterol (PROAIR HFA) 108 (90 Base) MCG/ACT inhaler Inhale 2 puffs into the lungs every 6 (six) hours as needed for wheezing or shortness of breath. INHALE 2 PUFFS INTO THE LUNGS EVERY 6 HOURS AS NEEDED FOR WHEEZING OR SHORTNESS OF BREATH 3 Inhaler 3   arformoterol (BROVANA) 15 MCG/2ML NEBU Take 2 mLs (15 mcg total) by nebulization at bedtime. (Patient taking differently: Take 15 mcg by nebulization every evening.) 120 mL 6  budesonide (PULMICORT) 0.5 MG/2ML nebulizer solution Take 2 mLs (0.5 mg total) by nebulization in the morning and at bedtime. (Patient taking differently: Take 0.5 mg by nebulization 2 (two) times daily.) 360 mL 3   busPIRone (BUSPAR) 15 MG tablet Take 1 tablet (15 mg total) by mouth 2 (two) times daily. With extra 1/2 tab midday     Cholecalciferol (VITAMIN D) 50 MCG (2000 UT) tablet Take 1 tablet (2,000 Units total) by mouth daily.     Coenzyme Q-10 200 MG CAPS Take 200 mg by mouth daily.     Cyanocobalamin (B-12) 5000 MCG CAPS Take 5,000 mcg by mouth daily.     cyclobenzaprine (FLEXERIL) 10 MG tablet Take 0.5-1 tablets (5-10 mg total) by mouth 2 (two) times daily as needed for muscle spasms. (Patient taking differently: Take 5 mg by mouth at bedtime as needed for muscle spasms.) 30 tablet 0   esomeprazole (NEXIUM) 40 MG capsule Take 1 capsule (40 mg total) by mouth daily at 12 noon. 30 capsule 11   ezetimibe (ZETIA) 10 MG tablet TAKE 1 TABLET BY MOUTH EVERYDAY AT BEDTIME 90 tablet 2   fexofenadine (ALLEGRA) 180 MG tablet Take 180 mg by mouth as needed.      FLUoxetine (PROZAC) 20 MG tablet Take  20 mg by mouth 2 (two) times daily.     fluticasone (FLONASE) 50 MCG/ACT nasal spray USE 2 SPRAYS INTO THE NOSE EVERY 12 HOURS AS NEEDED FOR STUFFY NOSE 48 mL 2   glucose blood (ONETOUCH VERIO) test strip USE AS INSTRUCTED TO CHECK SUGAR 1 TIME DAILY 100 strip 3   guaiFENesin (MUCINEX) 600 MG 12 hr tablet Take 2 tablets (1,200 mg total) by mouth 2 (two) times daily as needed for cough or to loosen phlegm.     Iron, Ferrous Sulfate, 325 (65 Fe) MG TABS Take 325 mg by mouth daily. 90 tablet 3   isosorbide mononitrate (IMDUR) 30 MG 24 hr tablet Take 1 tablet (30 mg total) by mouth daily. 90 tablet 3   levothyroxine (SYNTHROID) 125 MCG tablet Take 1 tablet (125 mcg total) by mouth daily. 90 tablet 3   lidocaine (LIDODERM) 5 % Place 1 patch onto the skin every 12 (twelve) hours. Remove & Discard patch within 12 hours or as directed by MD 10 patch 0   metFORMIN (GLUCOPHAGE) 500 MG tablet Take 1 tablet (500 mg total) by mouth every evening. 90 tablet 3   nitroGLYCERIN (NITROSTAT) 0.4 MG SL tablet Place 1 tablet (0.4 mg total) under the tongue every 5 (five) minutes as needed for chest pain. 25 tablet 3   OneTouch Delica Lancets 33G MISC Use 1x a day 100 each 3   potassium chloride (KLOR-CON) 10 MEQ tablet Take 1 tablet (10 mEq total) by mouth daily. 90 tablet 3   rosuvastatin (CRESTOR) 10 MG tablet Take 1 tablet (10 mg total) by mouth every other day. 45 tablet 2   Spacer/Aero Chamber Mouthpiece MISC Use with  inhaler as needed.  J44.9 1 each 0   torsemide (DEMADEX) 10 MG tablet Take 1 tablet (10 mg total) by mouth as needed. 30 tablet 4   No current facility-administered medications for this visit.     Family History    Family History  Problem Relation Age of Onset   Heart disease Mother    Thyroid disease Mother    Emphysema Father        One lung   Cystic fibrosis Sister    Hyperthyroidism Sister  Osteoarthritis Brother    Hyperthyroidism Brother    Esophageal cancer Neg Hx    Cancer  Neg Hx        Head or neck   Colon cancer Neg Hx    Stomach cancer Neg Hx    Breast cancer Neg Hx    She indicated that her mother is deceased. She indicated that her father is deceased. She indicated that her sister is alive. She indicated that all of her three brothers are alive. She indicated that the status of her neg hx is unknown.  Social History    Social History   Socioeconomic History   Marital status: Widowed    Spouse name: Not on file   Number of children: 2   Years of education: Not on file   Highest education level: Not on file  Occupational History   Occupation: Retired    Fish farm manager: RETIRED  Tobacco Use   Smoking status: Former    Packs/day: 2.00    Years: 50.00    Total pack years: 100.00    Types: Cigarettes    Quit date: 02/05/2005    Years since quitting: 17.1   Smokeless tobacco: Never  Vaping Use   Vaping Use: Never used  Substance and Sexual Activity   Alcohol use: Yes    Comment: occasionally   Drug use: Never   Sexual activity: Not Currently  Other Topics Concern   Not on file  Social History Narrative   Widowed 2023, 2 children; Enjoys painting      Quit smoking in 2007; no alcohol; lives in [place]; [name]/d lives in [place]. Had own business. Dont drive- sec to neuropathy.    Social Determinants of Health   Financial Resource Strain: Not on file  Food Insecurity: Not on file  Transportation Needs: Not on file  Physical Activity: Not on file  Stress: Not on file  Social Connections: Not on file  Intimate Partner Violence: Not on file     Review of Systems    General:  No chills, fever, night sweats or weight changes. Endorses fatigue Cardiovascular:  No chest pain, endorses dyspnea on exertion, edema, but denies orthopnea, palpitations, paroxysmal nocturnal dyspnea. Dermatological: No rash, lesions/masses Respiratory: Endorses congested cough, exertional dyspnea Urologic: No hematuria, dysuria Abdominal:   No nausea, vomiting,  diarrhea, bright red blood per rectum, melena, or hematemesis Musculoskeletal: Endorses cramping to the hands, legs, and feet, endorses back pain Neurologic:  No visual changes, wkns, changes in mental status. All other systems reviewed and are otherwise negative except as noted above.   Physical Exam    VS:  BP (!) 159/76 (BP Location: Left Arm, Patient Position: Sitting, Cuff Size: Large)   Pulse 79   Ht 5\' 5"  (1.651 m)   Wt 232 lb 12.8 oz (105.6 kg)   LMP  (LMP Unknown)   SpO2 93%   BMI 38.74 kg/m  , BMI Body mass index is 38.74 kg/m.     Vitals:   03/29/22 0120 03/29/22 1314  BP: 138/78 (!) 159/76    GEN: Well nourished, well developed, in no acute distress. HEENT: normal. Glasses on  Neck: Supple, no JVD, carotid bruits, or masses. Cardiac: RRR, II/VI murmur RUSB, without rubs, or gallops. No clubbing, cyanosis, 2+ edema R>L.  Radials 2+/PT 2+ and equal bilaterally.  Respiratory:  Respirations regular and unlabored, diminished with expiratory wheezing to auscultation bilaterally. Respirations are unlabored at rest on 2.5 L of O2 via [place] GI: Soft, nontender, obese, nondistended, BS +  x 4. MS: no deformity or atrophy. Skin: warm and dry, no rash. Neuro:  Strength and sensation are intact. Psych: Normal affect.  Accessory Clinical Findings    ECG personally reviewed by me today- No new tracings were completed today  Lab Results  Component Value Date   WBC 6.0 03/10/2022   HGB 9.9 (L) 03/10/2022   HCT 32.2 (L) 03/10/2022   MCV 98.5 03/10/2022   PLT 180 03/10/2022   Lab Results  Component Value Date   CREATININE 1.20 (H) 03/19/2022   BUN 34 (H) 03/10/2022   NA 141 03/10/2022   K 4.1 03/10/2022   CL 96 (L) 03/10/2022   CO2 36 (H) 03/10/2022   Lab Results  Component Value Date   ALT 12 03/10/2022   AST 17 03/10/2022   ALKPHOS 84 03/10/2022   BILITOT 0.4 03/10/2022   Lab Results  Component Value Date   CHOL 140 08/06/2021   HDL 57.80 08/06/2021   LDLCALC  62 08/06/2021   LDLDIRECT 63.0 07/29/2015   TRIG 99.0 08/06/2021   CHOLHDL 2 08/06/2021    Lab Results  Component Value Date   HGBA1C 5.3 03/16/2022    Assessment & Plan   1.  HFpEF with recent echocardiogram completed today.  Previous LVEF 55-60%.  She continues with shortness of breath, productive cough and no significant improvement in her lower extremity edema.  She does have significant lung disease that is likely component of right-sided heart failure.  She had previously been changed to torsemide for better diuresis unfortunately kidney function continues to elevate on torsemide dosing.  Torsemide was decreased from daily dosing to sliding scale depending on her weight.  She continues to have lower back pain her daughter is concerned is related to something other than muscle discomfort.  With increased amount of cramping she has had since starting on torsemide and elevated serum creatinine we will decrease her torsemide to 10 mg on the same sliding scale with reevaluating her kidney function in 1 week when she goes for an iron infusion.  If continued elevated serum creatinine with decreasing diuretics and continued low back pain may need referral to Nephrology.She has been encouraged to continue to wear compression stockings as she has developed today, decrease his sodium intake, elevate her extremities, participate in foot calf pump exercises.  She will be notified via telephone prior echocardiogram results once study has been read.  2.  History of coronary artery calcification on prior CT.  Last stress test was completed in 2012.  She had previously had complaints of chest discomfort described as pressure and sometimes is indigestion.  Previous EKGs were unchanged.  She was started on Imdur 30 mg daily with a decrease in noted chest discomfort.  Previous discussion with primary cardiologist continue medical therapy for symptoms versus further ischemic workup at this time.  3.  Systolic  murmur with notable murmur on examination.  Echocardiogram completed today.  Previous studies did not demonstrate any significant valvular heart disease.  Will continue to monitor with surveillance echocardiograms.  4.  Essential hypertension with blood pressure today 159/76 recheck was 138/78.  She has been continued on Imdur 30 mg daily and torsemide 10 mg per sliding scale.  Patient is encouraged to continue to monitor her pressure at home.  5.  Mixed hyperlipidemia with last LDL of 62.  She is continued on rosuvastatin and ezetimibe.  6.  COPD on with continued on all of her inhalers chronic oxygen therapy.  Patient continues to be  followed by pulmonary.  7.  Disposition patient return to clinic to see MD/APP in 4 weeks or sooner if needed to follow-up on blood work and symptoms.  Latice Waitman, NP 03/29/2022, 2:16 PM

## 2022-03-30 ENCOUNTER — Encounter: Payer: Self-pay | Admitting: Physician Assistant

## 2022-03-30 DIAGNOSIS — I350 Nonrheumatic aortic (valve) stenosis: Secondary | ICD-10-CM | POA: Insufficient documentation

## 2022-03-30 HISTORY — DX: Nonrheumatic aortic (valve) stenosis: I35.0

## 2022-03-31 ENCOUNTER — Encounter (INDEPENDENT_AMBULATORY_CARE_PROVIDER_SITE_OTHER): Payer: Medicare Other

## 2022-03-31 ENCOUNTER — Encounter (INDEPENDENT_AMBULATORY_CARE_PROVIDER_SITE_OTHER): Payer: Medicare Other | Admitting: Nurse Practitioner

## 2022-04-02 NOTE — Telephone Encounter (Signed)
Left voice mail message to call the clinic on 04/02/22 @ 1224.

## 2022-04-03 ENCOUNTER — Other Ambulatory Visit: Payer: Self-pay | Admitting: Family Medicine

## 2022-04-03 DIAGNOSIS — D649 Anemia, unspecified: Secondary | ICD-10-CM

## 2022-04-07 ENCOUNTER — Inpatient Hospital Stay: Payer: Medicare Other

## 2022-04-07 ENCOUNTER — Ambulatory Visit: Payer: Medicare Other

## 2022-04-07 VITALS — BP 128/61 | HR 74 | Temp 96.4°F | Resp 18

## 2022-04-07 DIAGNOSIS — D649 Anemia, unspecified: Secondary | ICD-10-CM

## 2022-04-07 DIAGNOSIS — J449 Chronic obstructive pulmonary disease, unspecified: Secondary | ICD-10-CM | POA: Diagnosis not present

## 2022-04-07 DIAGNOSIS — Z85118 Personal history of other malignant neoplasm of bronchus and lung: Secondary | ICD-10-CM | POA: Diagnosis not present

## 2022-04-07 DIAGNOSIS — Z87891 Personal history of nicotine dependence: Secondary | ICD-10-CM | POA: Diagnosis not present

## 2022-04-07 DIAGNOSIS — Z9981 Dependence on supplemental oxygen: Secondary | ICD-10-CM | POA: Diagnosis not present

## 2022-04-07 DIAGNOSIS — D509 Iron deficiency anemia, unspecified: Secondary | ICD-10-CM | POA: Diagnosis not present

## 2022-04-07 DIAGNOSIS — Z79899 Other long term (current) drug therapy: Secondary | ICD-10-CM | POA: Diagnosis not present

## 2022-04-07 MED ORDER — METHYLPREDNISOLONE SODIUM SUCC 125 MG IJ SOLR
40.0000 mg | Freq: Once | INTRAMUSCULAR | Status: AC
  Administered 2022-04-07: 40 mg via INTRAVENOUS
  Filled 2022-04-07: qty 2

## 2022-04-07 MED ORDER — SODIUM CHLORIDE 0.9 % IV SOLN
200.0000 mg | Freq: Once | INTRAVENOUS | Status: AC
  Administered 2022-04-07: 200 mg via INTRAVENOUS
  Filled 2022-04-07: qty 200

## 2022-04-07 MED ORDER — SODIUM CHLORIDE 0.9 % IV SOLN
Freq: Once | INTRAVENOUS | Status: AC
  Filled 2022-04-07: qty 250

## 2022-04-07 NOTE — Patient Instructions (Signed)
Iron Sucrose Injection What is this medication? IRON SUCROSE (EYE ern SOO krose) treats low levels of iron (iron deficiency anemia) in people with kidney disease. Iron is a mineral that plays an important role in making red blood cells, which carry oxygen from your lungs to the rest of your body. This medicine may be used for other purposes; ask your health care provider or pharmacist if you have questions. COMMON BRAND NAME(S): Venofer What should I tell my care team before I take this medication? They need to know if you have any of these conditions: Anemia not caused by low iron levels Heart disease High levels of iron in the blood Kidney disease Liver disease An unusual or allergic reaction to iron, other medications, foods, dyes, or preservatives Pregnant or trying to get pregnant Breastfeeding How should I use this medication? This medication is for infusion into a vein. It is given in a hospital or clinic setting. Talk to your care team about the use of this medication in children. While this medication may be prescribed for children as young as 2 years for selected conditions, precautions do apply. Overdosage: If you think you have taken too much of this medicine contact a poison control center or emergency room at once. NOTE: This medicine is only for you. Do not share this medicine with others. What if I miss a dose? Keep appointments for follow-up doses. It is important not to miss your dose. Call your care team if you are unable to keep an appointment. What may interact with this medication? Do not take this medication with any of the following: Deferoxamine Dimercaprol Other iron products This medication may also interact with the following: Chloramphenicol Deferasirox This list may not describe all possible interactions. Give your health care provider a list of all the medicines, herbs, non-prescription drugs, or dietary supplements you use. Also tell them if you smoke,  drink alcohol, or use illegal drugs. Some items may interact with your medicine. What should I watch for while using this medication? Visit your care team regularly. Tell your care team if your symptoms do not start to get better or if they get worse. You may need blood work done while you are taking this medication. You may need to follow a special diet. Talk to your care team. Foods that contain iron include: whole grains/cereals, dried fruits, beans, or peas, leafy green vegetables, and organ meats (liver, kidney). What side effects may I notice from receiving this medication? Side effects that you should report to your care team as soon as possible: Allergic reactions--skin rash, itching, hives, swelling of the face, lips, tongue, or throat Low blood pressure--dizziness, feeling faint or lightheaded, blurry vision Shortness of breath Side effects that usually do not require medical attention (report to your care team if they continue or are bothersome): Flushing Headache Joint pain Muscle pain Nausea Pain, redness, or irritation at injection site This list may not describe all possible side effects. Call your doctor for medical advice about side effects. You may report side effects to FDA at 1-800-FDA-1088. Where should I keep my medication? This medication is given in a hospital or clinic and will not be stored at home. NOTE: This sheet is a summary. It may not cover all possible information. If you have questions about this medicine, talk to your doctor, pharmacist, or health care provider.  2023 Elsevier/Gold Standard (2020-06-05 00:00:00)

## 2022-04-08 ENCOUNTER — Encounter (INDEPENDENT_AMBULATORY_CARE_PROVIDER_SITE_OTHER): Payer: Medicare Other | Admitting: Nurse Practitioner

## 2022-04-08 ENCOUNTER — Encounter (INDEPENDENT_AMBULATORY_CARE_PROVIDER_SITE_OTHER): Payer: Medicare Other

## 2022-04-16 ENCOUNTER — Encounter: Payer: Self-pay | Admitting: Family Medicine

## 2022-04-21 ENCOUNTER — Encounter: Payer: Self-pay | Admitting: Internal Medicine

## 2022-04-22 ENCOUNTER — Ambulatory Visit (INDEPENDENT_AMBULATORY_CARE_PROVIDER_SITE_OTHER): Payer: Medicare Other | Admitting: Family Medicine

## 2022-04-22 ENCOUNTER — Encounter: Payer: Self-pay | Admitting: Family Medicine

## 2022-04-22 VITALS — BP 132/58 | HR 91 | Temp 97.6°F | Ht 65.0 in | Wt 235.0 lb

## 2022-04-22 DIAGNOSIS — R609 Edema, unspecified: Secondary | ICD-10-CM | POA: Diagnosis not present

## 2022-04-22 DIAGNOSIS — I2510 Atherosclerotic heart disease of native coronary artery without angina pectoris: Secondary | ICD-10-CM | POA: Diagnosis not present

## 2022-04-22 DIAGNOSIS — I2584 Coronary atherosclerosis due to calcified coronary lesion: Secondary | ICD-10-CM | POA: Diagnosis not present

## 2022-04-22 DIAGNOSIS — R0600 Dyspnea, unspecified: Secondary | ICD-10-CM | POA: Diagnosis not present

## 2022-04-22 NOTE — Progress Notes (Signed)
BLE edema.  She tried thigh high compression stockings but then couldn't get them on and had to go back to knee high compression stockings.  She had to cut back to 10mg  torsemide, with inc to 20mg  when weight was above 230 lbs.  She was 233 this AM.  235 lbs at the [place].  Legs have been weeping, L>R.  Still on O2 at baseline with SOB on exertion at baseline.  She is trying to sleep with legs elevated .  Meds, vitals, and allergies reviewed.   ROS: Per HPI unless specifically indicated in ROS section   GEN: nad, alert and oriented HEENT: ncat, on O2 at baseline via nasal cannula NECK: supple w/o LA CV: rrr. PULM: ctab, no inc wob ABD: soft, +bs EXT: 1-2+ BLE edema with chronic overlying skin changes on the bilateral shins SKIN: no acute rash  30 minutes were devoted to patient care in this encounter (this includes time spent reviewing the patient's file/history, interviewing and examining the patient, counseling/reviewing plan with patient).

## 2022-04-22 NOTE — Patient Instructions (Addendum)
I would try taking torsemide 10 mg daily at baseline with an extra 10 mg when your AM weight is above 230 lbs.    Go to the lab on the way out.   If you have mychart we'll likely use that to update you.     Let me see about potential referral to the kidney clinic or possible compression device/pumps for your legs.   Take care.  Glad to see you.

## 2022-04-23 LAB — CBC WITH DIFFERENTIAL/PLATELET
Basophils Absolute: 0 10*3/uL (ref 0.0–0.1)
Basophils Relative: 0.6 % (ref 0.0–3.0)
Eosinophils Absolute: 0.1 10*3/uL (ref 0.0–0.7)
Eosinophils Relative: 1.4 % (ref 0.0–5.0)
HCT: 26.2 % — ABNORMAL LOW (ref 36.0–46.0)
Hemoglobin: 8.7 g/dL — ABNORMAL LOW (ref 12.0–15.0)
Lymphocytes Relative: 19.2 % (ref 12.0–46.0)
Lymphs Abs: 1.1 10*3/uL (ref 0.7–4.0)
MCHC: 33.1 g/dL (ref 30.0–36.0)
MCV: 96.2 fl (ref 78.0–100.0)
Monocytes Absolute: 0.5 10*3/uL (ref 0.1–1.0)
Monocytes Relative: 9.9 % (ref 3.0–12.0)
Neutro Abs: 3.8 10*3/uL (ref 1.4–7.7)
Neutrophils Relative %: 68.9 % (ref 43.0–77.0)
Platelets: 201 10*3/uL (ref 150.0–400.0)
RBC: 2.73 Mil/uL — ABNORMAL LOW (ref 3.87–5.11)
RDW: 15.4 % (ref 11.5–15.5)
WBC: 5.5 10*3/uL (ref 4.0–10.5)

## 2022-04-23 LAB — BASIC METABOLIC PANEL
BUN: 29 mg/dL — ABNORMAL HIGH (ref 6–23)
CO2: 39 mEq/L — ABNORMAL HIGH (ref 19–32)
Calcium: 8.9 mg/dL (ref 8.4–10.5)
Chloride: 96 mEq/L (ref 96–112)
Creatinine, Ser: 1.48 mg/dL — ABNORMAL HIGH (ref 0.40–1.20)
GFR: 32.29 mL/min — ABNORMAL LOW (ref >60.00)
Glucose, Bld: 96 mg/dL (ref 70–99)
Potassium: 4.4 mEq/L (ref 3.5–5.1)
Sodium: 143 mEq/L (ref 135–145)

## 2022-04-23 LAB — BRAIN NATRIURETIC PEPTIDE: Pro B Natriuretic peptide (BNP): 69 pg/mL (ref 0.0–100.0)

## 2022-04-26 NOTE — Assessment & Plan Note (Signed)
Discussed possible contributing factors: Pulm disease CHF Renal disease Venous dysfunction in BLE  In the meantime, I would try taking torsemide 10 mg daily at baseline with an extra 10 mg when AM weight is above 230 lbs.    See notes on labs.  Reasonable to see about potential referral to the kidney clinic or possible compression device/pumps for BLE after we see her labs.

## 2022-04-27 ENCOUNTER — Encounter: Payer: Self-pay | Admitting: Family Medicine

## 2022-04-28 ENCOUNTER — Other Ambulatory Visit
Admission: RE | Admit: 2022-04-28 | Discharge: 2022-04-28 | Disposition: A | Discharge status: Home / Self Care | Payer: Medicare Other | Source: Ambulatory Visit | Attending: Family | Admitting: Family

## 2022-04-28 ENCOUNTER — Telehealth: Payer: Self-pay

## 2022-04-28 ENCOUNTER — Ambulatory Visit (HOSPITAL_BASED_OUTPATIENT_CLINIC_OR_DEPARTMENT_OTHER): Payer: Medicare Other | Admitting: Family

## 2022-04-28 ENCOUNTER — Encounter: Payer: Self-pay | Admitting: Pharmacist

## 2022-04-28 ENCOUNTER — Encounter: Payer: Self-pay | Admitting: Family

## 2022-04-28 ENCOUNTER — Telehealth: Payer: Self-pay | Admitting: Family Medicine

## 2022-04-28 ENCOUNTER — Ambulatory Visit (INDEPENDENT_AMBULATORY_CARE_PROVIDER_SITE_OTHER): Payer: Medicare Other | Admitting: Physician Assistant

## 2022-04-28 ENCOUNTER — Other Ambulatory Visit: Payer: Self-pay | Admitting: Family

## 2022-04-28 VITALS — BP 128/80 | HR 87 | Resp 18 | Wt 238.4 lb

## 2022-04-28 VITALS — BP 143/69 | HR 79 | Temp 97.8°F | Wt 238.0 lb

## 2022-04-28 DIAGNOSIS — N1832 Chronic kidney disease, stage 3b: Secondary | ICD-10-CM

## 2022-04-28 DIAGNOSIS — J449 Chronic obstructive pulmonary disease, unspecified: Secondary | ICD-10-CM | POA: Insufficient documentation

## 2022-04-28 DIAGNOSIS — E1136 Type 2 diabetes mellitus with diabetic cataract: Secondary | ICD-10-CM | POA: Insufficient documentation

## 2022-04-28 DIAGNOSIS — I10 Essential (primary) hypertension: Secondary | ICD-10-CM | POA: Diagnosis not present

## 2022-04-28 DIAGNOSIS — E1122 Type 2 diabetes mellitus with diabetic chronic kidney disease: Secondary | ICD-10-CM | POA: Diagnosis not present

## 2022-04-28 DIAGNOSIS — I5032 Chronic diastolic (congestive) heart failure: Secondary | ICD-10-CM | POA: Insufficient documentation

## 2022-04-28 DIAGNOSIS — Z7984 Long term (current) use of oral hypoglycemic drugs: Secondary | ICD-10-CM | POA: Insufficient documentation

## 2022-04-28 DIAGNOSIS — J439 Emphysema, unspecified: Secondary | ICD-10-CM | POA: Diagnosis not present

## 2022-04-28 DIAGNOSIS — R42 Dizziness and giddiness: Secondary | ICD-10-CM | POA: Insufficient documentation

## 2022-04-28 DIAGNOSIS — Z794 Long term (current) use of insulin: Secondary | ICD-10-CM | POA: Insufficient documentation

## 2022-04-28 DIAGNOSIS — I2510 Atherosclerotic heart disease of native coronary artery without angina pectoris: Secondary | ICD-10-CM | POA: Diagnosis not present

## 2022-04-28 DIAGNOSIS — I110 Hypertensive heart disease with heart failure: Secondary | ICD-10-CM | POA: Insufficient documentation

## 2022-04-28 DIAGNOSIS — J4489 Other specified chronic obstructive pulmonary disease: Secondary | ICD-10-CM

## 2022-04-28 DIAGNOSIS — E039 Hypothyroidism, unspecified: Secondary | ICD-10-CM | POA: Insufficient documentation

## 2022-04-28 DIAGNOSIS — I080 Rheumatic disorders of both mitral and aortic valves: Secondary | ICD-10-CM | POA: Insufficient documentation

## 2022-04-28 DIAGNOSIS — I890 Lymphedema, not elsewhere classified: Secondary | ICD-10-CM | POA: Insufficient documentation

## 2022-04-28 DIAGNOSIS — E1142 Type 2 diabetes mellitus with diabetic polyneuropathy: Secondary | ICD-10-CM | POA: Insufficient documentation

## 2022-04-28 DIAGNOSIS — Z79899 Other long term (current) drug therapy: Secondary | ICD-10-CM | POA: Insufficient documentation

## 2022-04-28 DIAGNOSIS — Z7951 Long term (current) use of inhaled steroids: Secondary | ICD-10-CM | POA: Insufficient documentation

## 2022-04-28 DIAGNOSIS — I872 Venous insufficiency (chronic) (peripheral): Secondary | ICD-10-CM

## 2022-04-28 DIAGNOSIS — K2100 Gastro-esophageal reflux disease with esophagitis, without bleeding: Secondary | ICD-10-CM | POA: Insufficient documentation

## 2022-04-28 DIAGNOSIS — F32A Depression, unspecified: Secondary | ICD-10-CM | POA: Insufficient documentation

## 2022-04-28 DIAGNOSIS — Z87891 Personal history of nicotine dependence: Secondary | ICD-10-CM | POA: Insufficient documentation

## 2022-04-28 DIAGNOSIS — Z87440 Personal history of urinary (tract) infections: Secondary | ICD-10-CM | POA: Insufficient documentation

## 2022-04-28 DIAGNOSIS — E785 Hyperlipidemia, unspecified: Secondary | ICD-10-CM | POA: Insufficient documentation

## 2022-04-28 DIAGNOSIS — G4733 Obstructive sleep apnea (adult) (pediatric): Secondary | ICD-10-CM | POA: Insufficient documentation

## 2022-04-28 DIAGNOSIS — I2584 Coronary atherosclerosis due to calcified coronary lesion: Secondary | ICD-10-CM | POA: Diagnosis not present

## 2022-04-28 LAB — BASIC METABOLIC PANEL
Anion gap: 7 (ref 5–15)
BUN: 32 mg/dL — ABNORMAL HIGH (ref 8–23)
CO2: 33 mmol/L — ABNORMAL HIGH (ref 22–32)
Calcium: 8.7 mg/dL — ABNORMAL LOW (ref 8.9–10.3)
Chloride: 99 mmol/L (ref 98–111)
Creatinine, Ser: 1.14 mg/dL — ABNORMAL HIGH (ref 0.44–1.00)
GFR, Estimated: 47 mL/min — ABNORMAL LOW (ref >60)
Glucose, Bld: 143 mg/dL — ABNORMAL HIGH (ref 70–99)
Potassium: 3.7 mmol/L (ref 3.5–5.1)
Sodium: 139 mmol/L (ref 135–145)

## 2022-04-28 MED ORDER — FLUTICASONE PROPIONATE 50 MCG/ACT NA SUSP: NASAL | 2 refills | Status: DC

## 2022-04-28 MED ORDER — METOLAZONE 2.5 MG PO TABS: 2.5000 mg | ORAL_TABLET | Freq: Every day | ORAL | 0 refills | Status: DC

## 2022-04-28 NOTE — Telephone Encounter (Signed)
Greatly appreciate help from Dr. Rogue Bussing and all at the hematology clinic.

## 2022-04-28 NOTE — Progress Notes (Signed)
Patient ID: Alexandria [name], female    DOB: 07-09-1935, 87 y.o.   MRN: [id number]  HPI  Alexandria [name] is a 87 y/o female with a history of lung cancer, DM, hyperlipidemia, HTN, thyroid disease, anxiety, COPD, GERD, anemia, OSA, previous tobacco use and chronic heart failure.   Echo 03/29/22 showed an EF of 55-60% with calcification on mitral valve and mild AS. Echo 02/11/21 showed EF of 55-60% along with mild/ moderate LAE and moderate/ severe mitral annular calcification.   Was in the ED 03/04/22 due to LBP. Was in the ED 02/17/22 due to peripheral edema.   She presents today for an acute HF f/u visit with a chief complaint of moderate fatigue with minimal exertion. Describes this as chronic in nature. Has associated cough, SOB, pedal edema (worsening), dizziness, anxiety and weight gain along with this. Denies any difficulty sleeping, abdominal distention, palpitations or chest pain.   She was wearing thigh high TED hose but then her legs have become so swollen that she can't wear them. She does report wearing her knee high compression socks. Has been having weeping/ draining of both legs. Has vascular appt later this afternoon.   Past Medical History:  Diagnosis Date   Allergy, unspecified not elsewhere classified    Anxiety    Aortic stenosis, mild 03/30/2022   TTE 03/29/2022: EF 55-60, no RWMA, GR 1 DD, normal RVSF, mild aortic stenosis (mean 10, V-max 212 cm/s, DI 0.70)   Cataract    Chronic diastolic congestive heart failure (HCC)    COPD (chronic obstructive pulmonary disease) ([place]) 03/08/1998   PFTs 12/12/2002 FEV 1 1.42 (64%) ratio 58 with no better after B2 and DLCO75%; PFTs 11/20/09 FEV1 1.50 (73%) ratio 50 no better after B2 with DLCO 62%; Hfa 50% 11/20/2009 >75%, 01/13/10 p coaching   Depression 12/07/1998   Diabetes mellitus type II 02/05/2006   Dr. Cruzita Lederer with endo   Disorders of bursae and tendons in shoulder region, unspecified    Rotator cuff syndrome, right   E. coli bacteremia     Esophagitis    GERD (gastroesophageal reflux disease)    History of UTI    Hyperlipidemia 10/04/2000   Hypertension 03/08/1992   Hypothyroidism 03/08/1968   Iron deficiency anemia    Lung nodule    radiation starts 10-08-2019   Malignant neoplasm of right upper lobe of lung ([place]) 07/24/2019   Obesity    NOS   OSA (obstructive sleep apnea)    PSG 01/27/10 AHI 13, pt does not know CPAP settings   Peripheral neuropathy    Likely due to DM per Dr. Murvin Natal hernia    Past Surgical History:  Procedure Laterality Date   ABD U/S  03/19/1999   Nml x2 foci in liver   ADENOSINE MYOVIEW  06/02/2007   Nml   CARDIOLITE PERSANTINE  08/24/2000   Nml   CAROTID U/S  08/24/2000   1-39% ICA stenosis   CAROTID U/S  06/02/2007   No apprec change    CARPAL TUNNEL RELEASE  12/1997   Right   CESAREAN SECTION     x2 [place]/ repeat   CHOLECYSTECTOMY  1997   COLONOSCOPY WITH PROPOFOL N/A 02/12/2016   Procedure: COLONOSCOPY WITH PROPOFOL;  Surgeon: Milus Banister, MD;  Location: [facility];  Service: Endoscopy;  Laterality: N/A;   CT ABD W & PELVIS WO/W CM     Abd hemangiomas of liver, 1 cm R renal cyst   DENTAL SURGERY  2016  Implants   DEXA  07/03/2003   Nml   ESOPHAGOGASTRODUODENOSCOPY  12/05/1997   Nml (due to hoarseness)   ESOPHAGOGASTRODUODENOSCOPY (EGD) WITH PROPOFOL N/A 02/12/2016   Procedure: ESOPHAGOGASTRODUODENOSCOPY (EGD) WITH PROPOFOL;  Surgeon: Milus Banister, MD;  Location: [facility];  Service: Endoscopy;  Laterality: N/A;   GALLBLADDER SURGERY     HERNIA REPAIR  01/24/2009   Lap Ventr w/ Lysis of adhesions (Dr. Donne Hazel)   knee arthroscopic surgery  years ago   right   ROTATOR CUFF REPAIR  1984   Right, [place]   SHOULDER OPEN ROTATOR CUFF REPAIR  02/08/2012   Procedure: ROTATOR CUFF REPAIR SHOULDER OPEN;  Surgeon: Magnus Sinning, MD;  Location: [facility];  Service: Orthopedics;  Laterality: Left;  Left Shoulder Open Anterior Acrominectomy Rotator Cuff  Repair Open Distal Clavicle Resection ,tissue mend graft, and repair of biceps tendon   THUMB RELEASE  12/1997   Right   TONSILLECTOMY     TOTAL ABDOMINAL HYSTERECTOMY  1985   Due to dysmennorhea   US ECHOCARDIOGRAPHY  06/02/2007   Family History  Problem Relation Age of Onset   Heart disease Mother    Thyroid disease Mother    Emphysema Father        One lung   Cystic fibrosis Sister    Hyperthyroidism Sister    Osteoarthritis Brother    Hyperthyroidism Brother    Esophageal cancer Neg Hx    Cancer Neg Hx        Head or neck   Colon cancer Neg Hx    Stomach cancer Neg Hx    Breast cancer Neg Hx    Social History   Tobacco Use   Smoking status: Former    Packs/day: 2.00    Years: 50.00    Total pack years: 100.00    Types: Cigarettes    Quit date: 02/05/2005    Years since quitting: 17.2   Smokeless tobacco: Never  Substance Use Topics   Alcohol use: Yes    Comment: occasionally   Allergies  Allergen Reactions   Antihistamines, Diphenhydramine-Type Other (See Comments)    Able to tolerate only allegra.    Sulfonamide Derivatives Swelling    REACTION: closed throat   Incruse Ellipta [Umeclidinium Bromide] Cough    Caused voice change and severe coughing   Clarithromycin Other (See Comments)    REACTION: diff swallowing and mouth blisters   Codeine Other (See Comments)    Unknown    Lyrica [Pregabalin] Other (See Comments)    Lack of effect for neuropathy pain.     Spiriva Handihaler [Tiotropium Bromide Monohydrate] Other (See Comments)    Voice changes   Vraylar [Cariprazine]     intolerant   Prior to Admission medications   Medication Sig Start Date End Date Taking? Authorizing Provider  albuterol (PROAIR HFA) 108 (90 Base) MCG/ACT inhaler Inhale 2 puffs into the lungs every 6 (six) hours as needed for wheezing or shortness of breath. INHALE 2 PUFFS INTO THE LUNGS EVERY 6 HOURS AS NEEDED FOR WHEEZING OR SHORTNESS OF BREATH 04/19/18  Yes Chesley Mires, MD   arformoterol (BROVANA) 15 MCG/2ML NEBU Take 2 mLs (15 mcg total) by nebulization at bedtime. 12/01/21  Yes Chesley Mires, MD  budesonide (PULMICORT) 0.5 MG/2ML nebulizer solution Take 2 mLs (0.5 mg total) by nebulization in the morning and at bedtime. 11/20/21  Yes Chesley Mires, MD  busPIRone (BUSPAR) 15 MG tablet Take 1 tablet (15 mg total) by mouth 2 (two) times daily.  With extra 1/2 tab midday Patient taking differently: Take 15 mg by mouth 3 (three) times daily. With extra 1/2 tab midday 12/31/21  Yes Tonia Ghent, MD  Cholecalciferol (VITAMIN D) 50 MCG (2000 UT) tablet Take 1 tablet (2,000 Units total) by mouth daily. 06/27/18  Yes Tonia Ghent, MD  Coenzyme Q-10 200 MG CAPS Take 200 mg by mouth daily.   Yes [provider]  Cyanocobalamin (B-12) 5000 MCG CAPS Take 5,000 mcg by mouth daily.   Yes [provider]  esomeprazole (NEXIUM) 40 MG capsule Take 1 capsule (40 mg total) by mouth daily at 12 noon. 08/17/21  Yes Levin Erp, PA  ezetimibe (ZETIA) 10 MG tablet TAKE 1 TABLET BY MOUTH EVERYDAY AT BEDTIME 03/18/22  Yes Tonia Ghent, MD  ferrous sulfate 325 (65 FE) MG tablet TAKE 1 TABLET BY MOUTH EVERY DAY 04/05/22  Yes Tonia Ghent, MD  FLUoxetine (PROZAC) 20 MG tablet Take 20 mg by mouth 2 (two) times daily.   Yes [provider]  glucose blood (ONETOUCH VERIO) test strip USE AS INSTRUCTED TO CHECK SUGAR 1 TIME DAILY 03/16/22  Yes Philemon Kingdom, MD  isosorbide mononitrate (IMDUR) 30 MG 24 hr tablet Take 1 tablet (30 mg total) by mouth daily. 02/23/22  Yes Richardson Dopp T, PA-C  levothyroxine (SYNTHROID) 125 MCG tablet Take 1 tablet (125 mcg total) by mouth daily. 09/11/21  Yes Philemon Kingdom, MD  metFORMIN (GLUCOPHAGE) 500 MG tablet Take 1 tablet (500 mg total) by mouth every evening. 03/16/22  Yes Philemon Kingdom, MD  potassium chloride (KLOR-CON) 10 MEQ tablet Take 1 tablet (10 mEq total) by mouth daily. 02/23/22  Yes Weaver, Scott T, PA-C   cyclobenzaprine (FLEXERIL) 10 MG tablet Take 0.5-1 tablets (5-10 mg total) by mouth 2 (two) times daily as needed for muscle spasms. 03/12/22   Tonia Ghent, MD  fexofenadine (ALLEGRA) 180 MG tablet Take 180 mg by mouth as needed.    [provider]  fluticasone (FLONASE) 50 MCG/ACT nasal spray USE 2 SPRAYS INTO THE NOSE EVERY 12 HOURS AS NEEDED FOR STUFFY NOSE 04/28/22   Tonia Ghent, MD  guaiFENesin (MUCINEX) 600 MG 12 hr tablet Take 2 tablets (1,200 mg total) by mouth 2 (two) times daily as needed for cough or to loosen phlegm. 08/25/21   Chesley Mires, MD  lidocaine (LIDODERM) 5 % Place 1 patch onto the skin every 12 (twelve) hours. Remove & Discard patch within 12 hours or as directed by MD 03/04/22 03/04/23  Blake Divine, MD  nitroGLYCERIN (NITROSTAT) 0.4 MG SL tablet Place 1 tablet (0.4 mg total) under the tongue every 5 (five) minutes as needed for chest pain. 07/16/21   Tonia Ghent, MD  OneTouch Delica Lancets 42H MISC Use 1x a day 03/16/22   Philemon Kingdom, MD  rosuvastatin (CRESTOR) 10 MG tablet Take 1 tablet (10 mg total) by mouth every other day. 11/10/21   Tonia Ghent, MD  Spacer/Aero Chamber Mouthpiece MISC Use with  inhaler as needed.  J44.9 09/30/17   Tonia Ghent, MD  torsemide (DEMADEX) 10 MG tablet Take 1 tablet (10 mg total) by mouth as needed. 03/29/22   Gerrie Nordmann, NP    Review of Systems  Constitutional:  Positive for fatigue. Negative for appetite change.  HENT:  Negative for congestion, postnasal drip and sore throat.   Eyes: Negative.   Respiratory:  Positive for cough and shortness of breath. Negative for chest tightness.   Cardiovascular:  Positive for leg swelling (R>L). Negative for chest pain and palpitations.  Gastrointestinal:  Negative for abdominal distention and abdominal pain.  Endocrine: Negative.   Genitourinary: Negative.   Musculoskeletal:  Positive for back pain. Negative for neck pain.  Skin: Negative.    Allergic/Immunologic: Negative.   Neurological:  Positive for dizziness and light-headedness.  Hematological:  Negative for adenopathy. Does not bruise/bleed easily.  Psychiatric/Behavioral:  Negative for dysphoric mood and sleep disturbance (adjustable bed; sleeping on 1 pillow with oxygen @ 2L). The patient is nervous/anxious.    Vitals:   04/28/22 1146  BP: 128/80  Pulse: 87  Resp: 18  SpO2: 94%  Weight: 238 lb 6 oz (108.1 kg)   Wt Readings from Last 3 Encounters:  04/28/22 238 lb 6 oz (108.1 kg)  04/22/22 235 lb (106.6 kg)  03/29/22 232 lb 12.8 oz (105.6 kg)   Lab Results  Component Value Date   CREATININE 1.48 (H) 04/22/2022   CREATININE 1.20 (H) 03/19/2022   CREATININE 1.36 (H) 03/10/2022   Physical Exam Vitals and nursing note reviewed. Chaperone present: daughter, Lavella Lemons was on the phone during visit.  Constitutional:      Appearance: Normal appearance.  HENT:     Head: Normocephalic and atraumatic.  Cardiovascular:     Rate and Rhythm: Normal rate and regular rhythm.  Pulmonary:     Effort: Pulmonary effort is normal. No respiratory distress.     Breath sounds: No wheezing, rhonchi or rales.  Abdominal:     General: There is no distension.     Palpations: Abdomen is soft.     Tenderness: There is no abdominal tenderness.  Musculoskeletal:        General: Tenderness present.     Cervical back: Normal range of motion and neck supple.     Right lower leg: Edema (lymphedema >L) present.     Left lower leg: Edema (lymphedema) present.  Skin:    General: Skin is warm and dry.  Neurological:     General: No focal deficit present.     Mental Status: She is alert and oriented to person, place, and time.  Psychiatric:        Mood and Affect: Mood normal.        Behavior: Behavior normal.   Assessment & Plan:  1: Chronic heart failure with preserved ejection fraction- - NYHA class III - euvolemic  - weighing daily; reminded to call for an overnight weight gain  of > 2 pounds or a weekly weight gain of > 5 pounds - weight up 11 pounds from last visit here 5 weeks ago - not adding salt and has been reading labels for sodium content; understands to keep it 2000mg  - saw cardiology Kathlen Mody) 02/23/22 - has been drinking some pedialyte (2 large bottles/ month); cautioned about sodium content of this; says that she's been trying to keep her daily fluid to 60-64 oz - h/o UTI so may not be a good SGLT2 candidate - torsemide 20mg  daily for the last couple of weeks - will add metolazone 2.5mg  daily for 2 days - BMP today and will contact patient to advise of any extra potassium supplementation needed - she will also return in 2 days for repeat BMP to have a plan for the weekend - potassium 4meq daily - consider losartan 12.5mg   - BNP 03/04/22 was 88.2 - PharmD reconciled medications w/ patient  2: HTN- - BP 128/80 - saw PCP [name]) 03/12/22 - BMP 04/22/22 showed sodium 143, potassium  4.4, creatinine 1.48 & GFR 32.29  3: COPD- - wearing oxygen at 2L RTC except will increase it to 3L upon exertion  4: DM- - saw endocrinology Cruzita Lederer) 03/16/22 - A1c 03/16/22 was 5.3% - home glucose today was 119 - metformin 500mg  daily  5: Lymphedema- - not wearing thigh high compression socks as she says that she couldn't get them on - has been wearing knee high compression socks - not elevating her legs consistently and admits to not propping them up higher than her heart - sees vascular later today for possible unna boot wraps   Medications reviewed.   Return in 3-4 weeks, sooner if needed.

## 2022-04-28 NOTE — Telephone Encounter (Signed)
Dr. Zada Girt need your input.  Her hemoglobin is lower in spite of infusions and I suspect that her anemia is exacerbating her other symptoms.  Is it possible to get her appointment with your for 05/11/2022 moved sooner?  Many thanks.

## 2022-04-28 NOTE — Telephone Encounter (Signed)
Erx sent

## 2022-04-28 NOTE — Progress Notes (Signed)
Office Note     CC:  follow up Requesting Provider:  Tonia Ghent, MD  HPI: Alexandria [name] is a 87 y.o. (Feb 04, 1936) female who presents for follow up of venous insufficiency. She was here one month ago with non invasive studies showing deep and superficial venous insufficiency in her right leg. She has symptoms bilaterally. She reports she was measured and fitted for thigh high compression however she was only able to wear them for a short interval of time because her swelling became worse and she could no longer get them on. She since has been wearing her knee high. She does elevate at night in her adjustable bed but she is limited due to her respiratory status and being on home oxygen. She has hard time getting her legs above level  of her heart. She explains that over past several days she has had increased swelling and pain and weeping from both of her legs. She has been wrapping them in gauze due to the drainage. She is not having any fever or chills.   Past Medical History:  Diagnosis Date   Allergy, unspecified not elsewhere classified    Anxiety    Aortic stenosis, mild 03/30/2022   TTE 03/29/2022: EF 55-60, no RWMA, GR 1 DD, normal RVSF, mild aortic stenosis (mean 10, V-max 212 cm/s, DI 0.70)   Cataract    Chronic diastolic congestive heart failure (HCC)    COPD (chronic obstructive pulmonary disease) ([place]) 03/08/1998   PFTs 12/12/2002 FEV 1 1.42 (64%) ratio 58 with no better after B2 and DLCO75%; PFTs 11/20/09 FEV1 1.50 (73%) ratio 50 no better after B2 with DLCO 62%; Hfa 50% 11/20/2009 >75%, 01/13/10 p coaching   Depression 12/07/1998   Diabetes mellitus type II 02/05/2006   Dr. Cruzita Lederer with endo   Disorders of bursae and tendons in shoulder region, unspecified    Rotator cuff syndrome, right   E. coli bacteremia    Esophagitis    GERD (gastroesophageal reflux disease)    History of UTI    Hyperlipidemia 10/04/2000   Hypertension 03/08/1992   Hypothyroidism 03/08/1968    Iron deficiency anemia    Lung nodule    radiation starts 10-08-2019   Malignant neoplasm of right upper lobe of lung ([place]) 07/24/2019   Obesity    NOS   OSA (obstructive sleep apnea)    PSG 01/27/10 AHI 13, pt does not know CPAP settings   Peripheral neuropathy    Likely due to DM per Dr. Murvin Natal hernia     Past Surgical History:  Procedure Laterality Date   ABD U/S  03/19/1999   Nml x2 foci in liver   ADENOSINE MYOVIEW  06/02/2007   Nml   CARDIOLITE PERSANTINE  08/24/2000   Nml   CAROTID U/S  08/24/2000   1-39% ICA stenosis   CAROTID U/S  06/02/2007   No apprec change    CARPAL TUNNEL RELEASE  12/1997   Right   CESAREAN SECTION     x2 [place]/ repeat   CHOLECYSTECTOMY  1997   COLONOSCOPY WITH PROPOFOL N/A 02/12/2016   Procedure: COLONOSCOPY WITH PROPOFOL;  Surgeon: Milus Banister, MD;  Location: [facility];  Service: Endoscopy;  Laterality: N/A;   CT ABD W & PELVIS WO/W CM     Abd hemangiomas of liver, 1 cm R renal cyst   DENTAL SURGERY  2016   Implants   DEXA  07/03/2003   Nml   ESOPHAGOGASTRODUODENOSCOPY  12/05/1997  Nml (due to hoarseness)   ESOPHAGOGASTRODUODENOSCOPY (EGD) WITH PROPOFOL N/A 02/12/2016   Procedure: ESOPHAGOGASTRODUODENOSCOPY (EGD) WITH PROPOFOL;  Surgeon: Milus Banister, MD;  Location: [facility];  Service: Endoscopy;  Laterality: N/A;   GALLBLADDER SURGERY     HERNIA REPAIR  01/24/2009   Lap Ventr w/ Lysis of adhesions (Dr. Donne Hazel)   knee arthroscopic surgery  years ago   right   ROTATOR CUFF REPAIR  1984   Right, [place]   SHOULDER OPEN ROTATOR CUFF REPAIR  02/08/2012   Procedure: ROTATOR CUFF REPAIR SHOULDER OPEN;  Surgeon: Magnus Sinning, MD;  Location: [facility];  Service: Orthopedics;  Laterality: Left;  Left Shoulder Open Anterior Acrominectomy Rotator Cuff Repair Open Distal Clavicle Resection ,tissue mend graft, and repair of biceps tendon   THUMB RELEASE  12/1997   Right   TONSILLECTOMY     TOTAL ABDOMINAL  HYSTERECTOMY  1985   Due to dysmennorhea   US ECHOCARDIOGRAPHY  06/02/2007    Social History   Socioeconomic History   Marital status: Widowed    Spouse name: Not on file   Number of children: 2   Years of education: Not on file   Highest education level: Not on file  Occupational History   Occupation: Retired    Fish farm manager: RETIRED  Tobacco Use   Smoking status: Former    Packs/day: 2.00    Years: 50.00    Total pack years: 100.00    Types: Cigarettes    Quit date: 02/05/2005    Years since quitting: 17.2   Smokeless tobacco: Never  Vaping Use   Vaping Use: Never used  Substance and Sexual Activity   Alcohol use: Yes    Comment: occasionally   Drug use: Never   Sexual activity: Not Currently  Other Topics Concern   Not on file  Social History Narrative   Widowed 2023, 2 children; Enjoys painting      Quit smoking in 2007; no alcohol; lives in [place]; [name]/d lives in [place]. Had own business. Dont drive- sec to neuropathy.    Social Determinants of Health   Financial Resource Strain: Not on file  Food Insecurity: Not on file  Transportation Needs: Not on file  Physical Activity: Not on file  Stress: Not on file  Social Connections: Not on file  Intimate Partner Violence: Not on file    Family History  Problem Relation Age of Onset   Heart disease Mother    Thyroid disease Mother    Emphysema Father        One lung   Cystic fibrosis Sister    Hyperthyroidism Sister    Osteoarthritis Brother    Hyperthyroidism Brother    Esophageal cancer Neg Hx    Cancer Neg Hx        Head or neck   Colon cancer Neg Hx    Stomach cancer Neg Hx    Breast cancer Neg Hx     Current Outpatient Medications  Medication Sig Dispense Refill   albuterol (PROAIR HFA) 108 (90 Base) MCG/ACT inhaler Inhale 2 puffs into the lungs every 6 (six) hours as needed for wheezing or shortness of breath. INHALE 2 PUFFS INTO THE LUNGS EVERY 6 HOURS AS NEEDED FOR WHEEZING OR SHORTNESS  OF BREATH 3 Inhaler 3   arformoterol (BROVANA) 15 MCG/2ML NEBU Take 2 mLs (15 mcg total) by nebulization at bedtime. 120 mL 6   budesonide (PULMICORT) 0.5 MG/2ML nebulizer solution Take 2 mLs (0.5 mg total) by nebulization in  the morning and at bedtime. 360 mL 3   busPIRone (BUSPAR) 15 MG tablet Take 1 tablet (15 mg total) by mouth 2 (two) times daily. With extra 1/2 tab midday (Patient taking differently: Take 15 mg by mouth 3 (three) times daily. With extra 1/2 tab midday)     Cholecalciferol (VITAMIN D) 50 MCG (2000 UT) tablet Take 1 tablet (2,000 Units total) by mouth daily.     Coenzyme Q-10 200 MG CAPS Take 200 mg by mouth daily.     Cyanocobalamin (B-12) 5000 MCG CAPS Take 5,000 mcg by mouth daily.     cyclobenzaprine (FLEXERIL) 10 MG tablet Take 0.5-1 tablets (5-10 mg total) by mouth 2 (two) times daily as needed for muscle spasms. 30 tablet 0   esomeprazole (NEXIUM) 40 MG capsule Take 1 capsule (40 mg total) by mouth daily at 12 noon. 30 capsule 11   ezetimibe (ZETIA) 10 MG tablet TAKE 1 TABLET BY MOUTH EVERYDAY AT BEDTIME 90 tablet 2   ferrous sulfate 325 (65 FE) MG tablet TAKE 1 TABLET BY MOUTH EVERY DAY 90 tablet 3   fexofenadine (ALLEGRA) 180 MG tablet Take 180 mg by mouth as needed.     FLUoxetine (PROZAC) 20 MG tablet Take 20 mg by mouth 2 (two) times daily.     fluticasone (FLONASE) 50 MCG/ACT nasal spray USE 2 SPRAYS INTO THE NOSE EVERY 12 HOURS AS NEEDED FOR STUFFY NOSE 48 mL 2   glucose blood (ONETOUCH VERIO) test strip USE AS INSTRUCTED TO CHECK SUGAR 1 TIME DAILY 100 strip 3   guaiFENesin (MUCINEX) 600 MG 12 hr tablet Take 2 tablets (1,200 mg total) by mouth 2 (two) times daily as needed for cough or to loosen phlegm.     isosorbide mononitrate (IMDUR) 30 MG 24 hr tablet Take 1 tablet (30 mg total) by mouth daily. 90 tablet 3   levothyroxine (SYNTHROID) 125 MCG tablet Take 1 tablet (125 mcg total) by mouth daily. 90 tablet 3   lidocaine (LIDODERM) 5 % Place 1 patch onto the  skin every 12 (twelve) hours. Remove & Discard patch within 12 hours or as directed by MD 10 patch 0   metFORMIN (GLUCOPHAGE) 500 MG tablet Take 1 tablet (500 mg total) by mouth every evening. 90 tablet 3   metolazone (ZAROXOLYN) 2.5 MG tablet Take 1 tablet (2.5 mg total) by mouth daily. 2 tablet 0   nitroGLYCERIN (NITROSTAT) 0.4 MG SL tablet Place 1 tablet (0.4 mg total) under the tongue every 5 (five) minutes as needed for chest pain. 25 tablet 3   OneTouch Delica Lancets 22L MISC Use 1x a day 100 each 3   potassium chloride (KLOR-CON) 10 MEQ tablet Take 1 tablet (10 mEq total) by mouth daily. 90 tablet 3   rosuvastatin (CRESTOR) 10 MG tablet Take 1 tablet (10 mg total) by mouth every other day. 45 tablet 2   Spacer/Aero Chamber Mouthpiece MISC Use with  inhaler as needed.  J44.9 1 each 0   torsemide (DEMADEX) 10 MG tablet Take 1 tablet (10 mg total) by mouth as needed. 30 tablet 4   No current facility-administered medications for this visit.    Allergies  Allergen Reactions   Antihistamines, Diphenhydramine-Type Other (See Comments)    Able to tolerate only allegra.    Sulfonamide Derivatives Swelling    REACTION: closed throat   Incruse Ellipta [Umeclidinium Bromide] Cough    Caused voice change and severe coughing   Clarithromycin Other (See Comments)    REACTION:  diff swallowing and mouth blisters   Codeine Other (See Comments)    Unknown    Lyrica [Pregabalin] Other (See Comments)    Lack of effect for neuropathy pain.     Spiriva Handihaler [Tiotropium Bromide Monohydrate] Other (See Comments)    Voice changes   Vraylar [Cariprazine]     intolerant     REVIEW OF SYSTEMS:  [X]  denotes positive finding, [ ]  denotes negative finding Cardiac  Comments:  Chest pain or chest pressure:    Shortness of breath upon exertion:    Short of breath when lying flat:    Irregular heart rhythm:        Vascular    Pain in calf, thigh, or hip brought on by ambulation:    Pain in  feet at night that wakes you up from your sleep:     Blood clot in your veins:    Leg swelling:  X       Pulmonary    Oxygen at home:    Productive cough:     Wheezing:         Neurologic    Sudden weakness in arms or legs:     Sudden numbness in arms or legs:     Sudden onset of difficulty speaking or slurred speech:    Temporary loss of vision in one eye:     Problems with dizziness:         Gastrointestinal    Blood in stool:     Vomited blood:         Genitourinary    Burning when urinating:     Blood in urine:        Psychiatric    Major depression:         Hematologic    Bleeding problems:    Problems with blood clotting too easily:        Skin    Rashes or ulcers:        Constitutional    Fever or chills:      PHYSICAL EXAMINATION:  Vitals:   04/28/22 1331  BP: (!) 143/69  Pulse: 79  Temp: 97.8 F (36.6 C)  TempSrc: Temporal  SpO2: 98%  Weight: 238 lb (108 kg)    General:  WDWN in NAD; vital signs documented above Gait: Not observed, in wheel chair HENT: WNL, normocephalic Pulmonary: non labored, on 2 L [place] Cardiac: regular HR Vascular Exam/Pulses: edematous BLE R > L, chronic venous changes to bilateral lower extremities with hyperkeratosis, lymphorrhea, papillomatosis and hyperplasia. Various areas of ecchymosis also present. Unable to palpate pedal pulses due to edema Musculoskeletal: no muscle wasting or atrophy  Neurologic: A&O X 3;  No focal weakness or paresthesias are detected Psychiatric:  The pt has Normal affect.   ASSESSMENT/PLAN:: 87 y.o. female here for follow up for venous insufficiency. She has known chronic venous insufficiency with deep and superficial reflux on prior duplex exam. She is tentatively a candidate for venous ablation of the RLE. She was provided thigh high compression at her last visit, but due to recent increase in swelling she has not been able to get them on. She is however using knee high compression stockings and  trying to elevate. She has had increase in weeping from her legs. No signs of cellulitis at this time. I have recommended application of Unna Boots to BLE. She will keep these on and return in 1 week to have her legs checked and possible unna boot change.  Karoline Caldwell, PA-C [facility] [phone]  Clinic MD:   [facility]

## 2022-04-28 NOTE — Patient Instructions (Addendum)
Continue weighing daily and call for an overnight weight gain of 3 pounds or more or a weekly weight gain of more than 5 pounds.   Start tomorrow, take your booster pill (metolazone) 1/2 hour before torsemide. You will do this for the next 2 days. I will send you a mychart message once I get your labs back to let you know how much more potassium to take   Return this Friday at 1pm to get your labs drawn again

## 2022-04-28 NOTE — Telephone Encounter (Signed)
Prescription Request  04/28/2022  Is this a "Controlled Substance" medicine? No  LOV: 04/22/2022  What is the name of the medication or equipment? fluticasone (FLONASE) 50 MCG/ACT nasal spray  Requesting 3 month supply  Have you contacted your pharmacy to request a refill? Yes   Which pharmacy would you like this sent to?  [id number] [address], [place] [name] [place] [address] Phone: [phone] Fax: [phone]   Patient notified that their request is being sent to the clinical staff for review and that they should receive a response within 2 business days.   Please advise at Mobile [phone] (mobile)

## 2022-04-28 NOTE — Telephone Encounter (Signed)
I returned the call to [facility] for a msg left with our after hours answering service. She stated her moms legs are swollen to the point where she cannot put on her compression hose. She states that she is unsure when the sxs actually started getting worse and/or if her mom has been wearing the Thigh High compression hose. She also stated she was unsure if her mom has been elevating her legs properly daily as recommend at her last ov with [place]. I did advised that the recommendations provided by our PA McKenzi is our typical recommendations when we treat our pts that have Venous Insuffiencey. I also advised that if she comes in today we will more than likely place the pt in unna boot wraps but they will need to be changed weekly along with proper elevation daily to see an actual improvement in her sxs. Alexandria [name] advised they are willing to try. I scheduled pt for OV app in our [facility].

## 2022-04-29 NOTE — Progress Notes (Signed)
Patient ID: Alexandria [name], female   DOB: 04/18/1935, 87 y.o.   MRN: [id number]  [place] - PHARMACIST COUNSELING NOTE  *HFpEF*  Guideline-Directed Medical Therapy/Evidence Based Medicine  ACE/ARB/ARNI: N/A Beta Blocker:  none Aldosterone Antagonist:  none Diuretic: Torsemide 20 mg daily SGLT2i:  n/a - recurrent UTI - no good candidate for SGLT2i  Adherence Assessment  Do you ever forget to take your medication? [] Yes [x] No  Do you ever skip doses due to side effects? [] Yes [x] No  Do you have trouble affording your medicines? [] Yes [x] No  Are you ever unable to pick up your medication due to transportation difficulties? [] Yes [x] No  Do you ever stop taking your medications because you don't believe they are helping? [] Yes [x] No  Do you check your weight daily? [x] Yes [] No   Adherence strategy: pill box (filled by daughter 3 weeks at a time)  Barriers to obtaining medications: none  Vital signs: HR 87, BP 128/80, weight (pounds) 238 lbs  ECHO: Date 03/29/22, EF of 55-60% with calcification on mitral valve and mild AS      Latest Ref Rng & Units 04/28/2022   12:40 PM 04/22/2022    4:24 PM 03/19/2022    3:07 PM  BMP  Glucose 70 - 99 mg/dL 143  96    BUN 8 - 23 mg/dL 32  29    Creatinine 0.44 - 1.00 mg/dL 1.14  1.48  1.20   Sodium 135 - 145 mmol/L 139  143    Potassium 3.5 - 5.1 mmol/L 3.7  4.4    Chloride 98 - 111 mmol/L 99  96    CO2 22 - 32 mmol/L 33  39    Calcium 8.9 - 10.3 mg/dL 8.7  8.9      Past Medical History:  Diagnosis Date   Allergy, unspecified not elsewhere classified    Anxiety    Aortic stenosis, mild 03/30/2022   TTE 03/29/2022: EF 55-60, no RWMA, GR 1 DD, normal RVSF, mild aortic stenosis (mean 10, V-max 212 cm/s, DI 0.70)   Cataract    Chronic diastolic congestive heart failure (HCC)    COPD (chronic obstructive pulmonary disease) ([place]) 03/08/1998   PFTs 12/12/2002 FEV 1 1.42 (64%) ratio 58 with no  better after B2 and DLCO75%; PFTs 11/20/09 FEV1 1.50 (73%) ratio 50 no better after B2 with DLCO 62%; Hfa 50% 11/20/2009 >75%, 01/13/10 p coaching   Depression 12/07/1998   Diabetes mellitus type II 02/05/2006   Dr. Cruzita Lederer with endo   Disorders of bursae and tendons in shoulder region, unspecified    Rotator cuff syndrome, right   E. coli bacteremia    Esophagitis    GERD (gastroesophageal reflux disease)    History of UTI    Hyperlipidemia 10/04/2000   Hypertension 03/08/1992   Hypothyroidism 03/08/1968   Iron deficiency anemia    Lung nodule    radiation starts 10-08-2019   Malignant neoplasm of right upper lobe of lung ([place]) 07/24/2019   Obesity    NOS   OSA (obstructive sleep apnea)    PSG 01/27/10 AHI 13, pt does not know CPAP settings   Peripheral neuropathy    Likely due to DM per Dr. Murvin Natal hernia     ASSESSMENT 87 year old female who presents to the HF clinic for follow up. PMH includes lung cancer, DM, hyperlipidemia, HTN, thyroid disease, anxiety, COPD, GERD, anemia, OSA, previous tobacco use and chronic heart failure.  Patient and daughter are concern with increased low extremity edema and "weeping fluid" from her legs. Torsemide already increased from 10mg  daily to 20mg  daily by PCP. Noted last acute care admission was Dec/28/2023 for AKI and acute back pain.  Medication reconciliation done with patient and daughter (on the phone). Patient denies ADR to current medication or problems to afford medication. Noted 3lbs weight gain in 1 week. Patient also has follow up appt with vascular surgery this afternoon.  PLAN Start metolazone 2.5mg  daily x 2 days Continue torsemide 20mg  daily Repeat BMET in 3 days Consider adding spironolactone once renal function stable Follow up as indicated by NP  Time spent: 15 minutes  Marcellus Pulliam Rodriguez-Guzman PharmD, BCPS 04/29/2022 8:35 AM   Current Outpatient Medications:    albuterol (PROAIR HFA) 108 (90 Base) MCG/ACT inhaler,  Inhale 2 puffs into the lungs every 6 (six) hours as needed for wheezing or shortness of breath. INHALE 2 PUFFS INTO THE LUNGS EVERY 6 HOURS AS NEEDED FOR WHEEZING OR SHORTNESS OF BREATH, Disp: 3 Inhaler, Rfl: 3   arformoterol (BROVANA) 15 MCG/2ML NEBU, Take 2 mLs (15 mcg total) by nebulization at bedtime., Disp: 120 mL, Rfl: 6   budesonide (PULMICORT) 0.5 MG/2ML nebulizer solution, Take 2 mLs (0.5 mg total) by nebulization in the morning and at bedtime., Disp: 360 mL, Rfl: 3   busPIRone (BUSPAR) 15 MG tablet, Take 1 tablet (15 mg total) by mouth 2 (two) times daily. With extra 1/2 tab midday (Patient taking differently: Take 15 mg by mouth 3 (three) times daily. With extra 1/2 tab midday), Disp: , Rfl:    Cholecalciferol (VITAMIN D) 50 MCG (2000 UT) tablet, Take 1 tablet (2,000 Units total) by mouth daily., Disp: , Rfl:    Coenzyme Q-10 200 MG CAPS, Take 200 mg by mouth daily., Disp: , Rfl:    Cyanocobalamin (B-12) 5000 MCG CAPS, Take 5,000 mcg by mouth daily., Disp: , Rfl:    cyclobenzaprine (FLEXERIL) 10 MG tablet, Take 0.5-1 tablets (5-10 mg total) by mouth 2 (two) times daily as needed for muscle spasms., Disp: 30 tablet, Rfl: 0   esomeprazole (NEXIUM) 40 MG capsule, Take 1 capsule (40 mg total) by mouth daily at 12 noon., Disp: 30 capsule, Rfl: 11   ezetimibe (ZETIA) 10 MG tablet, TAKE 1 TABLET BY MOUTH EVERYDAY AT BEDTIME, Disp: 90 tablet, Rfl: 2   ferrous sulfate 325 (65 FE) MG tablet, TAKE 1 TABLET BY MOUTH EVERY DAY, Disp: 90 tablet, Rfl: 3   fexofenadine (ALLEGRA) 180 MG tablet, Take 180 mg by mouth as needed., Disp: , Rfl:    FLUoxetine (PROZAC) 20 MG tablet, Take 20 mg by mouth 2 (two) times daily., Disp: , Rfl:    fluticasone (FLONASE) 50 MCG/ACT nasal spray, USE 2 SPRAYS INTO THE NOSE EVERY 12 HOURS AS NEEDED FOR STUFFY NOSE, Disp: 48 mL, Rfl: 2   glucose blood (ONETOUCH VERIO) test strip, USE AS INSTRUCTED TO CHECK SUGAR 1 TIME DAILY, Disp: 100 strip, Rfl: 3   guaiFENesin (MUCINEX) 600  MG 12 hr tablet, Take 2 tablets (1,200 mg total) by mouth 2 (two) times daily as needed for cough or to loosen phlegm., Disp: , Rfl:    isosorbide mononitrate (IMDUR) 30 MG 24 hr tablet, Take 1 tablet (30 mg total) by mouth daily., Disp: 90 tablet, Rfl: 3   levothyroxine (SYNTHROID) 125 MCG tablet, Take 1 tablet (125 mcg total) by mouth daily., Disp: 90 tablet, Rfl: 3   lidocaine (LIDODERM) 5 %, Place 1  patch onto the skin every 12 (twelve) hours. Remove & Discard patch within 12 hours or as directed by MD, Disp: 10 patch, Rfl: 0   metFORMIN (GLUCOPHAGE) 500 MG tablet, Take 1 tablet (500 mg total) by mouth every evening., Disp: 90 tablet, Rfl: 3   metolazone (ZAROXOLYN) 2.5 MG tablet, Take 1 tablet (2.5 mg total) by mouth daily., Disp: 2 tablet, Rfl: 0   nitroGLYCERIN (NITROSTAT) 0.4 MG SL tablet, Place 1 tablet (0.4 mg total) under the tongue every 5 (five) minutes as needed for chest pain., Disp: 25 tablet, Rfl: 3   OneTouch Delica Lancets 25Q MISC, Use 1x a day, Disp: 100 each, Rfl: 3   potassium chloride (KLOR-CON) 10 MEQ tablet, Take 1 tablet (10 mEq total) by mouth daily., Disp: 90 tablet, Rfl: 3   rosuvastatin (CRESTOR) 10 MG tablet, Take 1 tablet (10 mg total) by mouth every other day., Disp: 45 tablet, Rfl: 2   Spacer/Aero Chamber Mouthpiece MISC, Use with  inhaler as needed.  J44.9, Disp: 1 each, Rfl: 0   torsemide (DEMADEX) 10 MG tablet, Take 1 tablet (10 mg total) by mouth as needed., Disp: 30 tablet, Rfl: 4   MEDICATION ADHERENCES TIPS AND STRATEGIES Taking medication as prescribed improves patient outcomes in heart failure (reduces hospitalizations, improves symptoms, increases survival) Side effects of medications can be managed by decreasing doses, switching agents, stopping drugs, or adding additional therapy. Please let someone in the [facility] know if you have having bothersome side effects so we can modify your regimen. Do not alter your medication regimen without  talking to [place].  Medication reminders can help patients remember to take drugs on time. If you are missing or forgetting doses you can try linking behaviors, using pill boxes, or an electronic reminder like an alarm on your phone or an app. Some people can also get automated phone calls as medication reminders.

## 2022-04-30 ENCOUNTER — Encounter: Payer: Self-pay | Admitting: Family

## 2022-04-30 ENCOUNTER — Encounter
Admission: RE | Admit: 2022-04-30 | Discharge: 2022-04-30 | Disposition: A | Discharge status: Home / Self Care | Payer: Medicare Other | Source: Ambulatory Visit | Attending: Family | Admitting: Family

## 2022-04-30 ENCOUNTER — Ambulatory Visit: Payer: PRIVATE HEALTH INSURANCE

## 2022-04-30 DIAGNOSIS — I5032 Chronic diastolic (congestive) heart failure: Secondary | ICD-10-CM | POA: Insufficient documentation

## 2022-04-30 LAB — BASIC METABOLIC PANEL
Anion gap: 11 (ref 5–15)
BUN: 39 mg/dL — ABNORMAL HIGH (ref 8–23)
CO2: 35 mmol/L — ABNORMAL HIGH (ref 22–32)
Calcium: 9.1 mg/dL (ref 8.9–10.3)
Chloride: 95 mmol/L — ABNORMAL LOW (ref 98–111)
Creatinine, Ser: 1.24 mg/dL — ABNORMAL HIGH (ref 0.44–1.00)
GFR, Estimated: 43 mL/min — ABNORMAL LOW (ref >60)
Glucose, Bld: 103 mg/dL — ABNORMAL HIGH (ref 70–99)
Potassium: 4.1 mmol/L (ref 3.5–5.1)
Sodium: 141 mmol/L (ref 135–145)

## 2022-05-03 ENCOUNTER — Encounter: Payer: Self-pay | Admitting: Internal Medicine

## 2022-05-03 ENCOUNTER — Inpatient Hospital Stay: Payer: Medicare Other

## 2022-05-03 ENCOUNTER — Inpatient Hospital Stay: Payer: Medicare Other | Attending: Internal Medicine

## 2022-05-03 ENCOUNTER — Inpatient Hospital Stay (HOSPITAL_BASED_OUTPATIENT_CLINIC_OR_DEPARTMENT_OTHER): Payer: Medicare Other | Admitting: Internal Medicine

## 2022-05-03 VITALS — BP 122/55 | HR 78 | Temp 98.0°F | Resp 18 | Wt 229.1 lb

## 2022-05-03 VITALS — BP 125/46 | HR 76

## 2022-05-03 DIAGNOSIS — D649 Anemia, unspecified: Secondary | ICD-10-CM | POA: Diagnosis not present

## 2022-05-03 DIAGNOSIS — J449 Chronic obstructive pulmonary disease, unspecified: Secondary | ICD-10-CM | POA: Insufficient documentation

## 2022-05-03 DIAGNOSIS — I5032 Chronic diastolic (congestive) heart failure: Secondary | ICD-10-CM | POA: Diagnosis not present

## 2022-05-03 DIAGNOSIS — Z87891 Personal history of nicotine dependence: Secondary | ICD-10-CM | POA: Insufficient documentation

## 2022-05-03 DIAGNOSIS — Z85118 Personal history of other malignant neoplasm of bronchus and lung: Secondary | ICD-10-CM | POA: Insufficient documentation

## 2022-05-03 DIAGNOSIS — G629 Polyneuropathy, unspecified: Secondary | ICD-10-CM | POA: Diagnosis not present

## 2022-05-03 DIAGNOSIS — Z79899 Other long term (current) drug therapy: Secondary | ICD-10-CM | POA: Diagnosis not present

## 2022-05-03 DIAGNOSIS — D509 Iron deficiency anemia, unspecified: Secondary | ICD-10-CM | POA: Diagnosis not present

## 2022-05-03 DIAGNOSIS — R5383 Other fatigue: Secondary | ICD-10-CM | POA: Insufficient documentation

## 2022-05-03 DIAGNOSIS — Z9981 Dependence on supplemental oxygen: Secondary | ICD-10-CM | POA: Diagnosis not present

## 2022-05-03 LAB — CBC WITH DIFFERENTIAL/PLATELET
Abs Immature Granulocytes: 0.04 10*3/uL (ref 0.00–0.07)
Basophils Absolute: 0 10*3/uL (ref 0.0–0.1)
Basophils Relative: 1 %
Eosinophils Absolute: 0.1 10*3/uL (ref 0.0–0.5)
Eosinophils Relative: 2 %
HCT: 27.8 % — ABNORMAL LOW (ref 36.0–46.0)
Hemoglobin: 8.5 g/dL — ABNORMAL LOW (ref 12.0–15.0)
Immature Granulocytes: 1 %
Lymphocytes Relative: 22 %
Lymphs Abs: 1.4 10*3/uL (ref 0.7–4.0)
MCH: 30.5 pg (ref 26.0–34.0)
MCHC: 30.6 g/dL (ref 30.0–36.0)
MCV: 99.6 fL (ref 80.0–100.0)
Monocytes Absolute: 0.7 10*3/uL (ref 0.1–1.0)
Monocytes Relative: 10 %
Neutro Abs: 4.3 10*3/uL (ref 1.7–7.7)
Neutrophils Relative %: 64 %
Platelets: 281 10*3/uL (ref 150–400)
RBC: 2.79 MIL/uL — ABNORMAL LOW (ref 3.87–5.11)
RDW: 14 % (ref 11.5–15.5)
WBC: 6.6 10*3/uL (ref 4.0–10.5)
nRBC: 0 % (ref 0.0–0.2)

## 2022-05-03 LAB — BASIC METABOLIC PANEL
Anion gap: 13 (ref 5–15)
BUN: 44 mg/dL — ABNORMAL HIGH (ref 8–23)
CO2: 35 mmol/L — ABNORMAL HIGH (ref 22–32)
Calcium: 8.8 mg/dL — ABNORMAL LOW (ref 8.9–10.3)
Chloride: 90 mmol/L — ABNORMAL LOW (ref 98–111)
Creatinine, Ser: 1.33 mg/dL — ABNORMAL HIGH (ref 0.44–1.00)
GFR, Estimated: 39 mL/min — ABNORMAL LOW (ref >60)
Glucose, Bld: 109 mg/dL — ABNORMAL HIGH (ref 70–99)
Potassium: 3.9 mmol/L (ref 3.5–5.1)
Sodium: 138 mmol/L (ref 135–145)

## 2022-05-03 MED ORDER — SODIUM CHLORIDE 0.9 % IV SOLN
Freq: Once | INTRAVENOUS | Status: AC
  Filled 2022-05-03: qty 250

## 2022-05-03 MED ORDER — METHYLPREDNISOLONE SODIUM SUCC 125 MG IJ SOLR
40.0000 mg | Freq: Once | INTRAMUSCULAR | Status: AC
  Administered 2022-05-03: 40 mg via INTRAVENOUS
  Filled 2022-05-03: qty 2

## 2022-05-03 MED ORDER — SODIUM CHLORIDE 0.9 % IV SOLN
200.0000 mg | Freq: Once | INTRAVENOUS | Status: AC
  Administered 2022-05-03: 200 mg via INTRAVENOUS
  Filled 2022-05-03: qty 200

## 2022-05-03 NOTE — Progress Notes (Signed)
Visit pushed up per request of her PCP stating hgb was dropping. Pt feels weak, lightheaded. Her knees won't hardly hold her up. She has edema in Bil LE's. Her legs are compression wrapped by [facility]. Never sees blood in her stool. Pt lives alone.

## 2022-05-03 NOTE — Progress Notes (Signed)
[facility] NOTE  Patient Care Team: Tonia Ghent, MD as PCP - General Rockey Situ, Kathlene November, MD as Consulting Physician (Cardiology) [name], NP as Nurse Practitioner (Adult Health Nurse Practitioner) Cammie Sickle, MD as Consulting Physician (Oncology)  CHIEF COMPLAINTS/PURPOSE OF CONSULTATION: ANEMIA  HEMATOLOGY HISTORY:  # CHRONIC INTERMITTENT ANEMIA  [since 2010]- Hb SEP 2022- 8; previously on PO Iron- ; WBC/platelets- Normal; MCV- 90s. EGD- > 15 years; /Colonoscopy:4 years- [Dr.Jacob;; in [place]] capsule-? Bone marrow Biopsy-none; OCT 2022- SEVERE IRON DEFICIENCY; myeloma work-up/hemolysis work-up negative; JAN 2023-CT scan duodenal thickening-recommend GI evaluation  #   AUG 2021- RIGHT LUNG stage I non-small cell lung cancer-  s/p SBRT   [Dr.Moody; [place]]; NOV 2022- CT chest-no recurrent disease.  # COPD on 2.5 lit/day [Dr.Sood]; diastolic CHF/ OCT [id number] BP [needing to come off losartan- Dr.Gollan]; peripheral neuropathy.  HISTORY OF PRESENTING ILLNESS: Alone; 2.5 Lit/ O2 24 x7. In wheel chair.  Ice book to the patient's daughter over the phone  Alexandria [name] 87 y.o.  female with chronic respiratory failure on oxygen and severe anemia secondary to iron deficiency of unclear etiology s/p venofer is here for follow-up.  In the interim patient was evaluated by PCP noted to have worsening anemia.  Referred to [place] for reevaluation.  Patient states that her visit pushed up per request of her PCP stating hgb was dropping. Pt feels weak, lightheaded.   Her knees won't hardly hold her up. She has edema in Bil LE's. Her legs are compression wrapped by [place] [facility]. Never sees blood in her stool. Pt lives alone..    Review of Systems  Constitutional:  Positive for malaise/fatigue. Negative for chills, diaphoresis, fever and weight loss.  HENT:  Negative for nosebleeds and sore throat.   Eyes:  Negative for double vision.   Respiratory:  Positive for cough and shortness of breath. Negative for hemoptysis, sputum production and wheezing.   Cardiovascular:  Negative for chest pain, palpitations, orthopnea and leg swelling.  Gastrointestinal:  Negative for abdominal pain, blood in stool, constipation, diarrhea, heartburn, melena, nausea and vomiting.  Genitourinary:  Negative for dysuria, frequency and urgency.  Musculoskeletal:  Negative for back pain and joint pain.  Skin: Negative.  Negative for itching and rash.  Neurological:  Positive for dizziness. Negative for tingling, focal weakness, weakness and headaches.  Endo/Heme/Allergies:  Does not bruise/bleed easily.  Psychiatric/Behavioral:  Negative for depression. The patient is not nervous/anxious and does not have insomnia.     MEDICAL HISTORY:  Past Medical History:  Diagnosis Date   Allergy, unspecified not elsewhere classified    Anxiety    Aortic stenosis, mild 03/30/2022   TTE 03/29/2022: EF 55-60, no RWMA, GR 1 DD, normal RVSF, mild aortic stenosis (mean 10, V-max 212 cm/s, DI 0.70)   Cataract    Chronic diastolic congestive heart failure (HCC)    COPD (chronic obstructive pulmonary disease) ([place]) 03/08/1998   PFTs 12/12/2002 FEV 1 1.42 (64%) ratio 58 with no better after B2 and DLCO75%; PFTs 11/20/09 FEV1 1.50 (73%) ratio 50 no better after B2 with DLCO 62%; Hfa 50% 11/20/2009 >75%, 01/13/10 p coaching   Depression 12/07/1998   Diabetes mellitus type II 02/05/2006   Dr. Cruzita Lederer with endo   Disorders of bursae and tendons in shoulder region, unspecified    Rotator cuff syndrome, right   E. coli bacteremia    Esophagitis    GERD (gastroesophageal reflux disease)    History of UTI  Hyperlipidemia 10/04/2000   Hypertension 03/08/1992   Hypothyroidism 03/08/1968   Iron deficiency anemia    Lung nodule    radiation starts 10-08-2019   Malignant neoplasm of right upper lobe of lung ([place]) 07/24/2019   Obesity    NOS   OSA (obstructive sleep  apnea)    PSG 01/27/10 AHI 13, pt does not know CPAP settings   Peripheral neuropathy    Likely due to DM per Dr. Murvin Natal hernia     SURGICAL HISTORY: Past Surgical History:  Procedure Laterality Date   ABD U/S  03/19/1999   Nml x2 foci in liver   ADENOSINE MYOVIEW  06/02/2007   Nml   CARDIOLITE PERSANTINE  08/24/2000   Nml   CAROTID U/S  08/24/2000   1-39% ICA stenosis   CAROTID U/S  06/02/2007   No apprec change    CARPAL TUNNEL RELEASE  12/1997   Right   CESAREAN SECTION     x2 [place]/ repeat   CHOLECYSTECTOMY  1997   COLONOSCOPY WITH PROPOFOL N/A 02/12/2016   Procedure: COLONOSCOPY WITH PROPOFOL;  Surgeon: Milus Banister, MD;  Location: [facility];  Service: Endoscopy;  Laterality: N/A;   CT ABD W & PELVIS WO/W CM     Abd hemangiomas of liver, 1 cm R renal cyst   DENTAL SURGERY  2016   Implants   DEXA  07/03/2003   Nml   ESOPHAGOGASTRODUODENOSCOPY  12/05/1997   Nml (due to hoarseness)   ESOPHAGOGASTRODUODENOSCOPY (EGD) WITH PROPOFOL N/A 02/12/2016   Procedure: ESOPHAGOGASTRODUODENOSCOPY (EGD) WITH PROPOFOL;  Surgeon: Milus Banister, MD;  Location: [facility];  Service: Endoscopy;  Laterality: N/A;   GALLBLADDER SURGERY     HERNIA REPAIR  01/24/2009   Lap Ventr w/ Lysis of adhesions (Dr. Donne Hazel)   knee arthroscopic surgery  years ago   right   ROTATOR CUFF REPAIR  1984   Right, [place]   SHOULDER OPEN ROTATOR CUFF REPAIR  02/08/2012   Procedure: ROTATOR CUFF REPAIR SHOULDER OPEN;  Surgeon: Magnus Sinning, MD;  Location: [facility];  Service: Orthopedics;  Laterality: Left;  Left Shoulder Open Anterior Acrominectomy Rotator Cuff Repair Open Distal Clavicle Resection ,tissue mend graft, and repair of biceps tendon   THUMB RELEASE  12/1997   Right   TONSILLECTOMY     TOTAL ABDOMINAL HYSTERECTOMY  1985   Due to dysmennorhea   US ECHOCARDIOGRAPHY  06/02/2007    SOCIAL HISTORY: Social History   Socioeconomic History   Marital status: Widowed     Spouse name: Not on file   Number of children: 2   Years of education: Not on file   Highest education level: Not on file  Occupational History   Occupation: Retired    Fish farm manager: RETIRED  Tobacco Use   Smoking status: Former    Packs/day: 2.00    Years: 50.00    Total pack years: 100.00    Types: Cigarettes    Quit date: 02/05/2005    Years since quitting: 17.2   Smokeless tobacco: Never  Vaping Use   Vaping Use: Never used  Substance and Sexual Activity   Alcohol use: Yes    Comment: occasionally   Drug use: Never   Sexual activity: Not Currently  Other Topics Concern   Not on file  Social History Narrative   Widowed 2023, 2 children; Enjoys painting      Quit smoking in 2007; no alcohol; lives in [place]; [name]/d lives in [place]. Had own business.  Dont drive- sec to neuropathy.    Social Determinants of Health   Financial Resource Strain: Not on file  Food Insecurity: Not on file  Transportation Needs: Not on file  Physical Activity: Not on file  Stress: Not on file  Social Connections: Not on file  Intimate Partner Violence: Not on file    FAMILY HISTORY: Family History  Problem Relation Age of Onset   Heart disease Mother    Thyroid disease Mother    Emphysema Father        One lung   Cystic fibrosis Sister    Hyperthyroidism Sister    Osteoarthritis Brother    Hyperthyroidism Brother    Esophageal cancer Neg Hx    Cancer Neg Hx        Head or neck   Colon cancer Neg Hx    Stomach cancer Neg Hx    Breast cancer Neg Hx     ALLERGIES:  is allergic to antihistamines, diphenhydramine-type; sulfonamide derivatives; incruse ellipta [umeclidinium bromide]; clarithromycin; codeine; lyrica [pregabalin]; spiriva handihaler [tiotropium bromide monohydrate]; and vraylar [cariprazine].  MEDICATIONS:  Current Outpatient Medications  Medication Sig Dispense Refill   albuterol (PROAIR HFA) 108 (90 Base) MCG/ACT inhaler Inhale 2 puffs into the lungs every 6  (six) hours as needed for wheezing or shortness of breath. INHALE 2 PUFFS INTO THE LUNGS EVERY 6 HOURS AS NEEDED FOR WHEEZING OR SHORTNESS OF BREATH 3 Inhaler 3   arformoterol (BROVANA) 15 MCG/2ML NEBU Take 2 mLs (15 mcg total) by nebulization at bedtime. 120 mL 6   budesonide (PULMICORT) 0.5 MG/2ML nebulizer solution Take 2 mLs (0.5 mg total) by nebulization in the morning and at bedtime. 360 mL 3   busPIRone (BUSPAR) 15 MG tablet Take 1 tablet (15 mg total) by mouth 2 (two) times daily. With extra 1/2 tab midday (Patient taking differently: Take 15 mg by mouth 3 (three) times daily. With extra 1/2 tab midday)     Cholecalciferol (VITAMIN D) 50 MCG (2000 UT) tablet Take 1 tablet (2,000 Units total) by mouth daily.     Coenzyme Q-10 200 MG CAPS Take 200 mg by mouth daily.     Cyanocobalamin (B-12) 5000 MCG CAPS Take 5,000 mcg by mouth daily.     cyclobenzaprine (FLEXERIL) 10 MG tablet Take 0.5-1 tablets (5-10 mg total) by mouth 2 (two) times daily as needed for muscle spasms. 30 tablet 0   esomeprazole (NEXIUM) 40 MG capsule Take 1 capsule (40 mg total) by mouth daily at 12 noon. 30 capsule 11   ezetimibe (ZETIA) 10 MG tablet TAKE 1 TABLET BY MOUTH EVERYDAY AT BEDTIME 90 tablet 2   ferrous sulfate 325 (65 FE) MG tablet TAKE 1 TABLET BY MOUTH EVERY DAY 90 tablet 3   fexofenadine (ALLEGRA) 180 MG tablet Take 180 mg by mouth as needed.     FLUoxetine (PROZAC) 20 MG tablet Take 20 mg by mouth 2 (two) times daily.     fluticasone (FLONASE) 50 MCG/ACT nasal spray USE 2 SPRAYS INTO THE NOSE EVERY 12 HOURS AS NEEDED FOR STUFFY NOSE 48 mL 2   glucose blood (ONETOUCH VERIO) test strip USE AS INSTRUCTED TO CHECK SUGAR 1 TIME DAILY 100 strip 3   guaiFENesin (MUCINEX) 600 MG 12 hr tablet Take 2 tablets (1,200 mg total) by mouth 2 (two) times daily as needed for cough or to loosen phlegm.     isosorbide mononitrate (IMDUR) 30 MG 24 hr tablet Take 1 tablet (30 mg total) by mouth daily.  90 tablet 3   levothyroxine  (SYNTHROID) 125 MCG tablet Take 1 tablet (125 mcg total) by mouth daily. 90 tablet 3   lidocaine (LIDODERM) 5 % Place 1 patch onto the skin every 12 (twelve) hours. Remove & Discard patch within 12 hours or as directed by MD 10 patch 0   metFORMIN (GLUCOPHAGE) 500 MG tablet Take 1 tablet (500 mg total) by mouth every evening. 90 tablet 3   metolazone (ZAROXOLYN) 2.5 MG tablet Take 1 tablet (2.5 mg total) by mouth daily. 2 tablet 0   nitroGLYCERIN (NITROSTAT) 0.4 MG SL tablet Place 1 tablet (0.4 mg total) under the tongue every 5 (five) minutes as needed for chest pain. 25 tablet 3   OneTouch Delica Lancets 99991111 MISC Use 1x a day 100 each 3   potassium chloride (KLOR-CON) 10 MEQ tablet Take 1 tablet (10 mEq total) by mouth daily. 90 tablet 3   rosuvastatin (CRESTOR) 10 MG tablet Take 1 tablet (10 mg total) by mouth every other day. 45 tablet 2   Spacer/Aero Chamber Mouthpiece MISC Use with  inhaler as needed.  J44.9 1 each 0   torsemide (DEMADEX) 10 MG tablet Take 1 tablet (10 mg total) by mouth as needed. 30 tablet 4   No current facility-administered medications for this visit.   Facility-Administered Medications Ordered in Other Visits  Medication Dose Route Frequency Provider Last Rate Last Admin   0.9 %  sodium chloride infusion   Intravenous Once Charlaine Dalton R, MD       iron sucrose (VENOFER) 200 mg in sodium chloride 0.9 % 100 mL IVPB  200 mg Intravenous Once Charlaine Dalton R, MD       methylPREDNISolone sodium succinate (SOLU-MEDROL) 125 mg/2 mL injection 40 mg  40 mg Intravenous Once Charlaine Dalton R, MD          PHYSICAL EXAMINATION:   Vitals:   05/03/22 1516  BP: (!) 122/55  Pulse: 78  Resp: 18  Temp: 98 F (36.7 C)  SpO2: 97%    Filed Weights   05/03/22 1516  Weight: 229 lb 1.6 oz (103.9 kg)     Physical Exam Vitals and nursing note reviewed.  HENT:     Head: Normocephalic and atraumatic.     Mouth/Throat:     Pharynx: Oropharynx is clear.   Eyes:     Extraocular Movements: Extraocular movements intact.     Pupils: Pupils are equal, round, and reactive to light.  Cardiovascular:     Rate and Rhythm: Normal rate and regular rhythm.  Pulmonary:     Comments: Decreased breath sounds bilaterally.  Scattered wheezing. Abdominal:     Palpations: Abdomen is soft.  Musculoskeletal:        General: Normal range of motion.     Cervical back: Normal range of motion.  Skin:    General: Skin is warm.  Neurological:     General: No focal deficit present.     Mental Status: She is alert and oriented to person, place, and time.  Psychiatric:        Behavior: Behavior normal.        Judgment: Judgment normal.     LABORATORY DATA:  I have reviewed the data as listed Lab Results  Component Value Date   WBC 6.6 05/03/2022   HGB 8.5 (L) 05/03/2022   HCT 27.8 (L) 05/03/2022   MCV 99.6 05/03/2022   PLT 281 05/03/2022   Recent Labs    02/17/22 1356 02/23/22 1611  03/04/22 1537 03/10/22 1446 03/19/22 1507 04/28/22 1240 04/30/22 1216 05/03/22 1502  NA 141   < > 142 141   < > 139 141 138  K 3.9   < > 4.0 4.1   < > 3.7 4.1 3.9  CL 105   < > 103 96*   < > 99 95* 90*  CO2 32   < > 32 36*   < > 33* 35* 35*  GLUCOSE 102*   < > 113* 137*   < > 143* 103* 109*  BUN 26*   < > 25* 34*   < > 32* 39* 44*  CREATININE 0.77   < > 1.05* 1.36*   < > 1.14* 1.24* 1.33*  CALCIUM 8.8*   < > 8.8* 8.6*   < > 8.7* 9.1 8.8*  GFRNONAA >60  --  52* 38*  --  47* 43* 39*  PROT 6.5  --  6.9 7.2  --   --   --   --   ALBUMIN 3.5  --  3.6 3.7  --   --   --   --   AST 13*  --  17 17  --   --   --   --   ALT 11  --  15 12  --   --   --   --   ALKPHOS 85  --  82 84  --   --   --   --   BILITOT 0.6  --  0.4 0.4  --   --   --   --    < > = values in this interval not displayed.     No results found.  Symptomatic anemia  # Iron deficiency Anemia [OCT 2022- Ferritin-7]; Symptomatic -chronic intermittent [SINCE 2010]; s/p IV Venofer.   # Worsening  anemia in spite of iron infusions- January 2024 iron saturation 17% ferritin 101-discussed with the patient and daughter regarding multiple other causes of anemia including primary bone marrow disorders.  Discussed regarding bone marrow biopsy for further evaluation.  Patient daughter are reluctant given overall condition-which I think is reasonable.  Proceed with iron infusions today and 1 more next week.   # ETIOLOGY:  UNCLEAR- CT CT AP- JAN- 11th, 2023- Mild wall thickening of the proximal duodenum at least in part related to under distension, may reflect peptic ulcer disease/duodenitis.  Status post evaluation with Dr. Ardis Hughs in [place] 2023]-as per patient patient is not a candidate for any upper and lower endoscopies. See above.   # Bilateral Lower extremity swelling- on lasix-  ? Cause of lower GFR- defer to PCP.   # CKD-stage III secondary to torsemide.  Followed by  I spoke at length with the patient's daughter over the phone- regarding the patient's clinical status/plan of care.  The daughter in agreement.   [wed pref] # DISPOSITION:  # Venofer today # venofer x 1 more next week-  ## follow up  6 week- MD:  cbc/bmp;iron studies;ferritin; LDH; b12- possible- venofer- Dr.B  All questions were answered. The patient knows to call the clinic with any problems, questions or concerns.    Cammie Sickle, MD 05/03/2022 3:52 PM

## 2022-05-03 NOTE — Assessment & Plan Note (Addendum)
#   Iron deficiency Anemia [OCT 2022- Ferritin-7]; Symptomatic -chronic intermittent [SINCE 2010]; s/p IV Venofer.   # Worsening anemia in spite of iron infusions- January 2024 iron saturation 17% ferritin 101-discussed with the patient and daughter regarding multiple other causes of anemia including primary bone marrow disorders.  Discussed regarding bone marrow biopsy for further evaluation.  Patient daughter are reluctant given overall condition-which I think is reasonable.  Proceed with iron infusions today and 1 more next week.   # ETIOLOGY:  UNCLEAR- CT CT AP- JAN- 11th, 2023- Mild wall thickening of the proximal duodenum at least in part related to under distension, may reflect peptic ulcer disease/duodenitis.  Status post evaluation with Dr. Ardis Hughs in [place] 2023]-as per patient patient is not a candidate for any upper and lower endoscopies. See above.   # Bilateral Lower extremity swelling- on lasix-  ? Cause of lower GFR- defer to PCP.   # CKD-stage III secondary to torsemide.  Followed by  I spoke at length with the patient's daughter over the phone- regarding the patient's clinical status/plan of care.  The daughter in agreement.   [wed pref] # DISPOSITION:  # Venofer today # venofer x 1 more next week-  ## follow up  6 week- MD:  cbc/bmp;iron studies;ferritin; LDH; b12- possible- venofer- Dr.B

## 2022-05-05 NOTE — Progress Notes (Signed)
Date:  05/07/2022   ID:  [name], DOB 1935-10-28, MRN [id number]  Patient Location:  [place] [place] [address]   Provider location:   [facility], [place] office  PCP:  Tonia Ghent, MD  Cardiologist:  Arvid Right [facility]  Chief Complaint  Patient presents with   Other    OD 4 wk f/u no complaints today.  Meds reviewed verbally with pt.     History of Present Illness:    ISREAL RUNQUIST is a 87 y.o. female with past medical history of obesity,   long history of smoking for 50 years, COPD, quit 15 years DM2, dx in 2006, non-insulin-dependent, controlled, with  mild CKD hyperlipidemia,  hypertension,   2012  chest pain, stress test at that time with no ischemia, normal EF  July 2016 with COPD exacerbation, sepsis. sleep apnea, not on CPAP, seen by Dr. Llana Aliment, mask does not fit Chronic SOB, normal echo 09/2014 Anxiety Diagnosis of lung cancer,  completed radiation , planned chemo Normal ejection fraction December 2022 Echo January 2024 EF 55 to 60%, mild AS She presents for follow-up of her shortness of breath symptoms  Last seen in clinic by myself April 2023 Seen by one of our providers January 2024 while followed in CHF clinic  Daughter reports she is sedentary Has an aid that helps place compression hose in the morning and take them off in the evening  Recent worsening of her leg swelling Unna boots placed for 1 week, torsemide increased 20 alternating with 10 Given several doses of metolazone Unna boots off yesterday,  Weight down to 225 pounds, likely her dry weight  Now taking torsemide 20 daily, alternating with 10 mg  Weight 225 and 235 has been her weight range today 227 Feels relatively well today, legs are tender on palpation with compression hose in place  Relatively high fluid intake, water, Pedialyte  EKG personally reviewed by myself on todays visit Nsr rate 70 beats per minute, poor R wave  progression to the anterior precordial leads, ST-T wave abnormality 1 and aVL  Echocardiogram February 11, 2021 reviewed  1. Left ventricular ejection fraction, by estimation, is 55 to 60%. The  left ventricle has normal function. The left ventricle has no regional  wall motion abnormalities. Left ventricular diastolic parameters are  consistent with Grade II diastolic  dysfunction (pseudonormalization).   2. Right ventricular systolic function is normal. The right ventricular  size is normal.   3. Left atrial size was mild to moderately dilated.   4. The mitral valve is degenerative. No evidence of mitral valve  regurgitation. Moderate to severe mitral annular calcification.   5. The aortic valve was not well visualized. Aortic valve regurgitation  is not visualized.   6. The inferior vena cava is normal in size with <50% respiratory  variability, suggesting right atrial pressure of 8 mmHg.    Past Medical History:  Diagnosis Date   Allergy, unspecified not elsewhere classified    Anxiety    Aortic stenosis, mild 03/30/2022   TTE 03/29/2022: EF 55-60, no RWMA, GR 1 DD, normal RVSF, mild aortic stenosis (mean 10, V-max 212 cm/s, DI 0.70)   Cataract    Chronic diastolic congestive heart failure (HCC)    COPD (chronic obstructive pulmonary disease) ([place]) 03/08/1998   PFTs 12/12/2002 FEV 1 1.42 (64%) ratio 58 with no better after B2 and DLCO75%; PFTs 11/20/09 FEV1 1.50 (73%) ratio 50 no  better after B2 with DLCO 62%; Hfa 50% 11/20/2009 >75%, 01/13/10 p coaching   Depression 12/07/1998   Diabetes mellitus type II 02/05/2006   Dr. Cruzita Lederer with endo   Disorders of bursae and tendons in shoulder region, unspecified    Rotator cuff syndrome, right   E. coli bacteremia    Esophagitis    GERD (gastroesophageal reflux disease)    History of UTI    Hyperlipidemia 10/04/2000   Hypertension 03/08/1992   Hypothyroidism 03/08/1968   Iron deficiency anemia    Lung nodule    radiation starts  10-08-2019   Malignant neoplasm of right upper lobe of lung ([place]) 07/24/2019   Obesity    NOS   OSA (obstructive sleep apnea)    PSG 01/27/10 AHI 13, pt does not know CPAP settings   Peripheral neuropathy    Likely due to DM per Dr. Murvin Natal hernia    Past Surgical History:  Procedure Laterality Date   ABD U/S  03/19/1999   Nml x2 foci in liver   ADENOSINE MYOVIEW  06/02/2007   Nml   CARDIOLITE PERSANTINE  08/24/2000   Nml   CAROTID U/S  08/24/2000   1-39% ICA stenosis   CAROTID U/S  06/02/2007   No apprec change    CARPAL TUNNEL RELEASE  12/1997   Right   CESAREAN SECTION     x2 [place]/ repeat   CHOLECYSTECTOMY  1997   COLONOSCOPY WITH PROPOFOL N/A 02/12/2016   Procedure: COLONOSCOPY WITH PROPOFOL;  Surgeon: Milus Banister, MD;  Location: [facility];  Service: Endoscopy;  Laterality: N/A;   CT ABD W & PELVIS WO/W CM     Abd hemangiomas of liver, 1 cm R renal cyst   DENTAL SURGERY  2016   Implants   DEXA  07/03/2003   Nml   ESOPHAGOGASTRODUODENOSCOPY  12/05/1997   Nml (due to hoarseness)   ESOPHAGOGASTRODUODENOSCOPY (EGD) WITH PROPOFOL N/A 02/12/2016   Procedure: ESOPHAGOGASTRODUODENOSCOPY (EGD) WITH PROPOFOL;  Surgeon: Milus Banister, MD;  Location: [facility];  Service: Endoscopy;  Laterality: N/A;   GALLBLADDER SURGERY     HERNIA REPAIR  01/24/2009   Lap Ventr w/ Lysis of adhesions (Dr. Donne Hazel)   knee arthroscopic surgery  years ago   right   ROTATOR CUFF REPAIR  1984   Right, [place]   SHOULDER OPEN ROTATOR CUFF REPAIR  02/08/2012   Procedure: ROTATOR CUFF REPAIR SHOULDER OPEN;  Surgeon: Magnus Sinning, MD;  Location: [facility];  Service: Orthopedics;  Laterality: Left;  Left Shoulder Open Anterior Acrominectomy Rotator Cuff Repair Open Distal Clavicle Resection ,tissue mend graft, and repair of biceps tendon   THUMB RELEASE  12/1997   Right   TONSILLECTOMY     TOTAL ABDOMINAL HYSTERECTOMY  1985   Due to dysmennorhea   US ECHOCARDIOGRAPHY   06/02/2007     Allergies:   Antihistamines, diphenhydramine-type; Sulfonamide derivatives; Incruse ellipta [umeclidinium bromide]; Clarithromycin; Codeine; Lyrica [pregabalin]; Spiriva handihaler [tiotropium bromide monohydrate]; and Vraylar [cariprazine]   Social History   Tobacco Use   Smoking status: Former    Packs/day: 2.00    Years: 50.00    Total pack years: 100.00    Types: Cigarettes    Quit date: 02/05/2005    Years since quitting: 17.2   Smokeless tobacco: Never  Vaping Use   Vaping Use: Never used  Substance Use Topics   Alcohol use: Yes    Comment: occasionally   Drug use: Never     Current  Outpatient Medications on File Prior to Visit  Medication Sig Dispense Refill   albuterol (PROAIR HFA) 108 (90 Base) MCG/ACT inhaler Inhale 2 puffs into the lungs every 6 (six) hours as needed for wheezing or shortness of breath. INHALE 2 PUFFS INTO THE LUNGS EVERY 6 HOURS AS NEEDED FOR WHEEZING OR SHORTNESS OF BREATH 3 Inhaler 3   arformoterol (BROVANA) 15 MCG/2ML NEBU Take 2 mLs (15 mcg total) by nebulization at bedtime. 120 mL 6   budesonide (PULMICORT) 0.5 MG/2ML nebulizer solution Take 2 mLs (0.5 mg total) by nebulization in the morning and at bedtime. 360 mL 3   busPIRone (BUSPAR) 15 MG tablet Take 1 tablet (15 mg total) by mouth 2 (two) times daily. With extra 1/2 tab midday (Patient taking differently: Take 15 mg by mouth 3 (three) times daily. With extra 1/2 tab midday)     Cholecalciferol (VITAMIN D) 50 MCG (2000 UT) tablet Take 1 tablet (2,000 Units total) by mouth daily.     Coenzyme Q-10 200 MG CAPS Take 200 mg by mouth daily.     Cyanocobalamin (B-12) 5000 MCG CAPS Take 5,000 mcg by mouth daily.     cyclobenzaprine (FLEXERIL) 10 MG tablet Take 0.5-1 tablets (5-10 mg total) by mouth 2 (two) times daily as needed for muscle spasms. 30 tablet 0   esomeprazole (NEXIUM) 40 MG capsule Take 1 capsule (40 mg total) by mouth daily at 12 noon. 30 capsule 11   ezetimibe (ZETIA) 10  MG tablet TAKE 1 TABLET BY MOUTH EVERYDAY AT BEDTIME 90 tablet 2   ferrous sulfate 325 (65 FE) MG tablet TAKE 1 TABLET BY MOUTH EVERY DAY 90 tablet 3   fexofenadine (ALLEGRA) 180 MG tablet Take 180 mg by mouth as needed.     FLUoxetine (PROZAC) 20 MG tablet Take 20 mg by mouth 2 (two) times daily.     fluticasone (FLONASE) 50 MCG/ACT nasal spray USE 2 SPRAYS INTO THE NOSE EVERY 12 HOURS AS NEEDED FOR STUFFY NOSE 48 mL 2   glucose blood (ONETOUCH VERIO) test strip USE AS INSTRUCTED TO CHECK SUGAR 1 TIME DAILY 100 strip 3   guaiFENesin (MUCINEX) 600 MG 12 hr tablet Take 2 tablets (1,200 mg total) by mouth 2 (two) times daily as needed for cough or to loosen phlegm.     isosorbide mononitrate (IMDUR) 30 MG 24 hr tablet Take 1 tablet (30 mg total) by mouth daily. 90 tablet 3   levothyroxine (SYNTHROID) 125 MCG tablet Take 1 tablet (125 mcg total) by mouth daily. 90 tablet 3   lidocaine (LIDODERM) 5 % Place 1 patch onto the skin every 12 (twelve) hours. Remove & Discard patch within 12 hours or as directed by MD 10 patch 0   metFORMIN (GLUCOPHAGE) 500 MG tablet Take 1 tablet (500 mg total) by mouth every evening. 90 tablet 3   metolazone (ZAROXOLYN) 2.5 MG tablet Take 1 tablet (2.5 mg total) by mouth daily. 2 tablet 0   nitroGLYCERIN (NITROSTAT) 0.4 MG SL tablet Place 1 tablet (0.4 mg total) under the tongue every 5 (five) minutes as needed for chest pain. 25 tablet 3   OneTouch Delica Lancets 99991111 MISC Use 1x a day 100 each 3   potassium chloride (KLOR-CON) 10 MEQ tablet Take 1 tablet (10 mEq total) by mouth daily. 90 tablet 3   rosuvastatin (CRESTOR) 10 MG tablet Take 1 tablet (10 mg total) by mouth every other day. 45 tablet 2   Spacer/Aero Chamber Mouthpiece MISC Use with  inhaler as needed.  J44.9 1 each 0   torsemide (DEMADEX) 10 MG tablet Take 1 tablet (10 mg total) by mouth as needed. 30 tablet 4   No current facility-administered medications on file prior to visit.    Family Hx: The  patient's family history includes Cystic fibrosis in her sister; Emphysema in her father; Heart disease in her mother; Hyperthyroidism in her brother and sister; Osteoarthritis in her brother; Thyroid disease in her mother. There is no history of Esophageal cancer, Cancer, Colon cancer, Stomach cancer, or Breast cancer.  ROS:   Please see the history of present illness.    Review of Systems  Constitutional: Negative.   HENT: Negative.    Respiratory: Negative.    Cardiovascular:  Positive for leg swelling.  Gastrointestinal: Negative.   Musculoskeletal: Negative.   Neurological: Negative.   Psychiatric/Behavioral: Negative.    All other systems reviewed and are negative.   Labs/Other Tests and Data Reviewed:    Recent Labs: 09/11/2021: TSH 1.40 03/04/2022: B Natriuretic Peptide 88.2 03/10/2022: ALT 12 04/22/2022: Pro B Natriuretic peptide (BNP) 69.0 05/03/2022: BUN 44; Creatinine, Ser 1.33; Hemoglobin 8.5; Platelets 281; Potassium 3.9; Sodium 138   Recent Lipid Panel Lab Results  Component Value Date/Time   CHOL 140 08/06/2021 04:24 PM   TRIG 99.0 08/06/2021 04:24 PM   HDL 57.80 08/06/2021 04:24 PM   CHOLHDL 2 08/06/2021 04:24 PM   LDLCALC 62 08/06/2021 04:24 PM   LDLDIRECT 63.0 07/29/2015 01:08 PM    Wt Readings from Last 3 Encounters:  05/07/22 229 lb (103.9 kg)  05/03/22 229 lb 1.6 oz (103.9 kg)  04/28/22 238 lb (108 kg)     Exam:    BP (!) 132/50 (BP Location: Right Arm, Patient Position: Sitting, Cuff Size: Large)   Pulse 70   Ht '5\' 5"'$  (1.651 m)   Wt 229 lb (103.9 kg)   LMP  (LMP Unknown)   SpO2 95%   BMI 38.11 kg/m  Constitutional:  oriented to person, place, and time. No distress.  HENT:  Head: Grossly normal Eyes:  no discharge. No scleral icterus.  Neck: No JVD, no carotid bruits  Cardiovascular: Regular rate and rhythm, no murmurs appreciated Trace pitting lower extremity edema with compression hose in place Pulmonary/Chest: Clear to auscultation  bilaterally, no wheezes or rails Abdominal: Soft.  no distension.  no tenderness.  Musculoskeletal: Normal range of motion Neurological:  normal muscle tone. Coordination normal. No atrophy Skin: Skin warm and dry Psychiatric: normal affect, pleasant  ASSESSMENT & PLAN:    Problem List Items Addressed This Visit       Cardiology Problems   Essential hypertension (Chronic)   Chronic heart failure with preserved ejection fraction (HCC) - Primary (Chronic)   PAD (peripheral artery disease) (HCC)   Hyperlipemia     Other   COPD with chronic bronchitis and emphysema (HCC) (Chronic)   Murmur   Other Visit Diagnoses     Type 2 diabetes mellitus with stage 3b chronic kidney disease, without long-term current use of insulin (HCC)       Lymphedema       Coronary artery calcification       HFrEF (heart failure with reduced ejection fraction) (HCC)           Chronic diastolic CHF/pulmonary hypertension Normal ejection fraction on echo Recommended continuing torsemide 20 alternating with 10 For weight 230 pounds or higher would increase up to torsemide 20 daily For weight 235 pounds would add metolazone no more  than 2.5 mg twice a week Periodic monitoring of renal function  Gait instability Uses a walker, no recent falls  COPD Long smoking history Followed by pulmonary Not on CPAP  Borderline diabetes Chronic renal insufficiency exacerbated by overdiuresis Severe mitral valve annular calcification with no significant stenosis   Total encounter time more than 40 minutes  Greater than 50% was spent in counseling and coordination of care with the patient   Signed, Ida Rogue, [place] [place] [phone], [place], [place] [id number]

## 2022-05-06 ENCOUNTER — Ambulatory Visit: Payer: Medicare Other

## 2022-05-06 ENCOUNTER — Ambulatory Visit: Payer: Commercial Managed Care - PPO

## 2022-05-07 ENCOUNTER — Encounter: Payer: Self-pay | Admitting: Cardiovascular Disease

## 2022-05-07 ENCOUNTER — Ambulatory Visit: Payer: Medicare Other | Attending: Cardiovascular Disease | Admitting: Cardiovascular Disease

## 2022-05-07 VITALS — BP 132/50 | HR 70 | Ht 65.0 in | Wt 229.0 lb

## 2022-05-07 DIAGNOSIS — I5032 Chronic diastolic (congestive) heart failure: Secondary | ICD-10-CM | POA: Diagnosis not present

## 2022-05-07 DIAGNOSIS — J439 Emphysema, unspecified: Secondary | ICD-10-CM | POA: Diagnosis not present

## 2022-05-07 DIAGNOSIS — I2510 Atherosclerotic heart disease of native coronary artery without angina pectoris: Secondary | ICD-10-CM | POA: Diagnosis not present

## 2022-05-07 DIAGNOSIS — E1122 Type 2 diabetes mellitus with diabetic chronic kidney disease: Secondary | ICD-10-CM | POA: Diagnosis not present

## 2022-05-07 DIAGNOSIS — J4489 Other specified chronic obstructive pulmonary disease: Secondary | ICD-10-CM | POA: Diagnosis not present

## 2022-05-07 DIAGNOSIS — I890 Lymphedema, not elsewhere classified: Secondary | ICD-10-CM | POA: Diagnosis not present

## 2022-05-07 DIAGNOSIS — N1832 Chronic kidney disease, stage 3b: Secondary | ICD-10-CM | POA: Diagnosis not present

## 2022-05-07 DIAGNOSIS — I10 Essential (primary) hypertension: Secondary | ICD-10-CM

## 2022-05-07 DIAGNOSIS — I5020 Unspecified systolic (congestive) heart failure: Secondary | ICD-10-CM

## 2022-05-07 DIAGNOSIS — I739 Peripheral vascular disease, unspecified: Secondary | ICD-10-CM | POA: Diagnosis not present

## 2022-05-07 DIAGNOSIS — E782 Mixed hyperlipidemia: Secondary | ICD-10-CM | POA: Diagnosis not present

## 2022-05-07 DIAGNOSIS — R011 Cardiac murmur, unspecified: Secondary | ICD-10-CM

## 2022-05-07 DIAGNOSIS — I2584 Coronary atherosclerosis due to calcified coronary lesion: Secondary | ICD-10-CM | POA: Diagnosis not present

## 2022-05-07 MED ORDER — METOLAZONE 2.5 MG PO TABS: ORAL_TABLET | ORAL | 1 refills | Status: DC

## 2022-05-07 MED ORDER — TORSEMIDE 10 MG PO TABS: ORAL_TABLET | ORAL | 5 refills | Status: DC

## 2022-05-07 NOTE — Patient Instructions (Addendum)
Medication Instructions:  Metolazone 2.5 mg twice a week as needed for weight 235 pounds #30  Torsemide 20 mg alternating with 10 mg daily We will send in as 10 mg pills (90 day script)  For weight 230 pounds, take torsemide 20 mg daily until weight back to 225  If you need a refill on your cardiac medications before your next appointment, please call your pharmacy.   Lab work: No new labs needed  Testing/Procedures: No new testing needed  Follow-Up: At [facility], you and your health needs are our priority.  As part of our continuing mission to provide you with exceptional heart care, we have created designated Provider Care Teams.  These Care Teams include your primary Cardiologist (physician) and Advanced Practice Providers (APPs -  Physician Assistants and Nurse Practitioners) who all work together to provide you with the care you need, when you need it.  You will need a follow up appointment in 6 months  Providers on your designated Care Team:   Murray Hodgkins, NP Christell Faith, PA-C Cadence Kathlen Mody, [place]  COVID-19 Vaccine Information can be found at: [email or web address].uk For questions related to vaccine distribution or appointments, please email [email or web address] or call [phone].

## 2022-05-07 NOTE — Progress Notes (Signed)
Patient present in office today for B/L Unna boot change. Today the swelling in B/L lower extremities have improved tremendously. After speaking with her daughter [name] via Phone call, she advised that her mom's torsemide was increased which may have played a big part in helping with the reduced swelling. Patient denies pain and drainage. Today, the unna boots were not replaced. Patient placed in low grade Knee High Compression socks from [employer] & advised she will need to return to our office PRN. Patient re-educated on how to put on Thigh High compression hose, foot movement exercises to increase her blood flow while sitting and proper elevation techniques . Patient voiced her understanding, [name] voiced her understanding as well.       Supervising MD: Althea Charon and Jasper Loser Yoselyn Mcglade A.,CMA

## 2022-05-08 ENCOUNTER — Encounter: Payer: Self-pay | Admitting: Family Medicine

## 2022-05-10 ENCOUNTER — Inpatient Hospital Stay: Payer: Medicare Other

## 2022-05-10 MED FILL — Iron Sucrose Inj 20 MG/ML (Fe Equiv): INTRAVENOUS | Qty: 10 | Status: AC

## 2022-05-11 ENCOUNTER — Other Ambulatory Visit: Payer: Medicare Other

## 2022-05-11 ENCOUNTER — Ambulatory Visit: Payer: Medicare Other | Admitting: Internal Medicine

## 2022-05-11 ENCOUNTER — Ambulatory Visit: Payer: Medicare Other

## 2022-05-11 ENCOUNTER — Other Ambulatory Visit: Payer: Self-pay | Admitting: Family Medicine

## 2022-05-11 MED ORDER — NYSTATIN 100000 UNIT/ML MT SUSP: 5.0000 mL | Freq: Four times a day (QID) | OROMUCOSAL | 0 refills | Status: DC

## 2022-05-13 ENCOUNTER — Inpatient Hospital Stay: Payer: Medicare Other | Attending: Internal Medicine

## 2022-05-13 VITALS — BP 123/48 | HR 69 | Temp 97.9°F | Resp 20

## 2022-05-13 DIAGNOSIS — R5383 Other fatigue: Secondary | ICD-10-CM | POA: Diagnosis not present

## 2022-05-13 DIAGNOSIS — Z9981 Dependence on supplemental oxygen: Secondary | ICD-10-CM | POA: Insufficient documentation

## 2022-05-13 DIAGNOSIS — Z79899 Other long term (current) drug therapy: Secondary | ICD-10-CM | POA: Insufficient documentation

## 2022-05-13 DIAGNOSIS — G629 Polyneuropathy, unspecified: Secondary | ICD-10-CM | POA: Diagnosis not present

## 2022-05-13 DIAGNOSIS — Z85118 Personal history of other malignant neoplasm of bronchus and lung: Secondary | ICD-10-CM | POA: Insufficient documentation

## 2022-05-13 DIAGNOSIS — D649 Anemia, unspecified: Secondary | ICD-10-CM

## 2022-05-13 DIAGNOSIS — J449 Chronic obstructive pulmonary disease, unspecified: Secondary | ICD-10-CM | POA: Insufficient documentation

## 2022-05-13 DIAGNOSIS — D509 Iron deficiency anemia, unspecified: Secondary | ICD-10-CM | POA: Diagnosis not present

## 2022-05-13 DIAGNOSIS — I5032 Chronic diastolic (congestive) heart failure: Secondary | ICD-10-CM | POA: Diagnosis not present

## 2022-05-13 DIAGNOSIS — Z87891 Personal history of nicotine dependence: Secondary | ICD-10-CM | POA: Insufficient documentation

## 2022-05-13 MED ORDER — METHYLPREDNISOLONE SODIUM SUCC 125 MG IJ SOLR
40.0000 mg | Freq: Once | INTRAMUSCULAR | Status: AC
  Administered 2022-05-13: 40 mg via INTRAVENOUS
  Filled 2022-05-13: qty 2

## 2022-05-13 MED ORDER — SODIUM CHLORIDE 0.9 % IV SOLN
Freq: Once | INTRAVENOUS | Status: AC
  Filled 2022-05-13: qty 250

## 2022-05-13 MED ORDER — SODIUM CHLORIDE 0.9 % IV SOLN
200.0000 mg | Freq: Once | INTRAVENOUS | Status: AC
  Administered 2022-05-13: 200 mg via INTRAVENOUS
  Filled 2022-05-13: qty 200

## 2022-05-13 NOTE — Patient Instructions (Signed)

## 2022-05-17 ENCOUNTER — Ambulatory Visit (HOSPITAL_BASED_OUTPATIENT_CLINIC_OR_DEPARTMENT_OTHER): Payer: Medicare Other | Admitting: Family

## 2022-05-17 ENCOUNTER — Other Ambulatory Visit
Admission: RE | Admit: 2022-05-17 | Discharge: 2022-05-17 | Disposition: A | Discharge status: Home / Self Care | Payer: Medicare Other | Source: Ambulatory Visit | Attending: Family | Admitting: Family

## 2022-05-17 ENCOUNTER — Encounter: Payer: Self-pay | Admitting: Family

## 2022-05-17 VITALS — BP 133/44 | HR 72 | Resp 16 | Wt 230.4 lb

## 2022-05-17 DIAGNOSIS — I890 Lymphedema, not elsewhere classified: Secondary | ICD-10-CM

## 2022-05-17 DIAGNOSIS — I10 Essential (primary) hypertension: Secondary | ICD-10-CM | POA: Diagnosis not present

## 2022-05-17 DIAGNOSIS — N1832 Chronic kidney disease, stage 3b: Secondary | ICD-10-CM

## 2022-05-17 DIAGNOSIS — J439 Emphysema, unspecified: Secondary | ICD-10-CM

## 2022-05-17 DIAGNOSIS — E1122 Type 2 diabetes mellitus with diabetic chronic kidney disease: Secondary | ICD-10-CM | POA: Diagnosis not present

## 2022-05-17 DIAGNOSIS — I5032 Chronic diastolic (congestive) heart failure: Secondary | ICD-10-CM | POA: Insufficient documentation

## 2022-05-17 DIAGNOSIS — J4489 Other specified chronic obstructive pulmonary disease: Secondary | ICD-10-CM

## 2022-05-17 LAB — BASIC METABOLIC PANEL
Anion gap: 8 (ref 5–15)
BUN: 30 mg/dL — ABNORMAL HIGH (ref 8–23)
CO2: 33 mmol/L — ABNORMAL HIGH (ref 22–32)
Calcium: 8.6 mg/dL — ABNORMAL LOW (ref 8.9–10.3)
Chloride: 98 mmol/L (ref 98–111)
Creatinine, Ser: 1.08 mg/dL — ABNORMAL HIGH (ref 0.44–1.00)
GFR, Estimated: 51 mL/min — ABNORMAL LOW (ref >60)
Glucose, Bld: 129 mg/dL — ABNORMAL HIGH (ref 70–99)
Potassium: 3.9 mmol/L (ref 3.5–5.1)
Sodium: 139 mmol/L (ref 135–145)

## 2022-05-17 NOTE — Patient Instructions (Addendum)
Do not drink anymore than 8 ounces of pedialyte daily, preferably none at all   Go to the [place] entrance to get your lab work drawn

## 2022-05-17 NOTE — Progress Notes (Signed)
Patient ID: Alexandria [name], female    DOB: 12-19-1935, 87 y.o.   MRN: [id number]  HPI  Alexandria [name] is a 87 y/o female with a history of lung cancer, DM, hyperlipidemia, HTN, thyroid disease, anxiety, COPD, GERD, anemia, OSA, previous tobacco use and chronic heart failure.   Echo 03/29/22 showed an EF of 55-60% with calcification on mitral valve and mild AS. Echo 02/11/21 showed EF of 55-60% along with mild/ moderate LAE and moderate/ severe mitral annular calcification.   Was in the ED 03/04/22 due to LBP. Was in the ED 02/17/22 due to peripheral edema.   She presents today for a HF f/u visit with a chief complaint of moderate SOB with minimal exertion. Describes this as chronic in nature. Has associated fatigue, cough, pedal edema, back pain & dizziness along with this. Denies any difficulty sleeping, abdominal distention, palpitations, chest pain or weight gain.   Wearing bilateral thigh high TED hose with continued edema.   Continues to drink "a lot" of pedialyte and says that she could drink 2-3 bottles if able because she finds it "so refreshing". Daughter has discussed w/ patient that she can't be drinking that much pedialyte.   Taking '20mg'$  torsemide QOD alternating with '10mg'$  QOD.   Past Medical History:  Diagnosis Date   Allergy, unspecified not elsewhere classified    Anxiety    Aortic stenosis, mild 03/30/2022   TTE 03/29/2022: EF 55-60, no RWMA, GR 1 DD, normal RVSF, mild aortic stenosis (mean 10, V-max 212 cm/s, DI 0.70)   Cataract    Chronic diastolic congestive heart failure (HCC)    COPD (chronic obstructive pulmonary disease) ([place]) 03/08/1998   PFTs 12/12/2002 FEV 1 1.42 (64%) ratio 58 with no better after B2 and DLCO75%; PFTs 11/20/09 FEV1 1.50 (73%) ratio 50 no better after B2 with DLCO 62%; Hfa 50% 11/20/2009 >75%, 01/13/10 p coaching   Depression 12/07/1998   Diabetes mellitus type II 02/05/2006   Dr. Cruzita Lederer with endo   Disorders of bursae and tendons in shoulder region,  unspecified    Rotator cuff syndrome, right   E. coli bacteremia    Esophagitis    GERD (gastroesophageal reflux disease)    History of UTI    Hyperlipidemia 10/04/2000   Hypertension 03/08/1992   Hypothyroidism 03/08/1968   Iron deficiency anemia    Lung nodule    radiation starts 10-08-2019   Malignant neoplasm of right upper lobe of lung ([place]) 07/24/2019   Obesity    NOS   OSA (obstructive sleep apnea)    PSG 01/27/10 AHI 13, pt does not know CPAP settings   Peripheral neuropathy    Likely due to DM per Dr. Murvin Natal hernia    Past Surgical History:  Procedure Laterality Date   ABD U/S  03/19/1999   Nml x2 foci in liver   ADENOSINE MYOVIEW  06/02/2007   Nml   CARDIOLITE PERSANTINE  08/24/2000   Nml   CAROTID U/S  08/24/2000   1-39% ICA stenosis   CAROTID U/S  06/02/2007   No apprec change    CARPAL TUNNEL RELEASE  12/1997   Right   CESAREAN SECTION     x2 [place]/ repeat   CHOLECYSTECTOMY  1997   COLONOSCOPY WITH PROPOFOL N/A 02/12/2016   Procedure: COLONOSCOPY WITH PROPOFOL;  Surgeon: Milus Banister, MD;  Location: [facility];  Service: Endoscopy;  Laterality: N/A;   CT ABD W & PELVIS WO/W CM     Abd hemangiomas  of liver, 1 cm R renal cyst   DENTAL SURGERY  2016   Implants   DEXA  07/03/2003   Nml   ESOPHAGOGASTRODUODENOSCOPY  12/05/1997   Nml (due to hoarseness)   ESOPHAGOGASTRODUODENOSCOPY (EGD) WITH PROPOFOL N/A 02/12/2016   Procedure: ESOPHAGOGASTRODUODENOSCOPY (EGD) WITH PROPOFOL;  Surgeon: Milus Banister, MD;  Location: [facility];  Service: Endoscopy;  Laterality: N/A;   GALLBLADDER SURGERY     HERNIA REPAIR  01/24/2009   Lap Ventr w/ Lysis of adhesions (Dr. Donne Hazel)   knee arthroscopic surgery  years ago   right   ROTATOR CUFF REPAIR  1984   Right, [place]   SHOULDER OPEN ROTATOR CUFF REPAIR  02/08/2012   Procedure: ROTATOR CUFF REPAIR SHOULDER OPEN;  Surgeon: Magnus Sinning, MD;  Location: [facility];  Service: Orthopedics;  Laterality:  Left;  Left Shoulder Open Anterior Acrominectomy Rotator Cuff Repair Open Distal Clavicle Resection ,tissue mend graft, and repair of biceps tendon   THUMB RELEASE  12/1997   Right   TONSILLECTOMY     TOTAL ABDOMINAL HYSTERECTOMY  1985   Due to dysmennorhea   US ECHOCARDIOGRAPHY  06/02/2007   Family History  Problem Relation Age of Onset   Heart disease Mother    Thyroid disease Mother    Emphysema Father        One lung   Cystic fibrosis Sister    Hyperthyroidism Sister    Osteoarthritis Brother    Hyperthyroidism Brother    Esophageal cancer Neg Hx    Cancer Neg Hx        Head or neck   Colon cancer Neg Hx    Stomach cancer Neg Hx    Breast cancer Neg Hx    Social History   Tobacco Use   Smoking status: Former    Packs/day: 2.00    Years: 50.00    Total pack years: 100.00    Types: Cigarettes    Quit date: 02/05/2005    Years since quitting: 17.2   Smokeless tobacco: Never  Substance Use Topics   Alcohol use: Yes    Comment: occasionally   Allergies  Allergen Reactions   Antihistamines, Diphenhydramine-Type Other (See Comments)    Able to tolerate only allegra.    Sulfonamide Derivatives Swelling    REACTION: closed throat   Incruse Ellipta [Umeclidinium Bromide] Cough    Caused voice change and severe coughing   Clarithromycin Other (See Comments)    REACTION: diff swallowing and mouth blisters   Codeine Other (See Comments)    Unknown    Lyrica [Pregabalin] Other (See Comments)    Lack of effect for neuropathy pain.     Spiriva Handihaler [Tiotropium Bromide Monohydrate] Other (See Comments)    Voice changes   Vraylar [Cariprazine]     intolerant   Prior to Admission medications   Medication Sig Start Date End Date Taking? Authorizing Provider  albuterol (PROAIR HFA) 108 (90 Base) MCG/ACT inhaler Inhale 2 puffs into the lungs every 6 (six) hours as needed for wheezing or shortness of breath. INHALE 2 PUFFS INTO THE LUNGS EVERY 6 HOURS AS NEEDED FOR  WHEEZING OR SHORTNESS OF BREATH 04/19/18  Yes Chesley Mires, MD  arformoterol (BROVANA) 15 MCG/2ML NEBU Take 2 mLs (15 mcg total) by nebulization at bedtime. 12/01/21  Yes Chesley Mires, MD  budesonide (PULMICORT) 0.5 MG/2ML nebulizer solution Take 2 mLs (0.5 mg total) by nebulization in the morning and at bedtime. 11/20/21  Yes Chesley Mires, MD  busPIRone (BUSPAR)  15 MG tablet Take 1 tablet (15 mg total) by mouth 2 (two) times daily. With extra 1/2 tab midday Patient taking differently: Take 15 mg by mouth 3 (three) times daily. With extra 1/2 tab midday 12/31/21  Yes Tonia Ghent, MD  Cholecalciferol (VITAMIN D) 50 MCG (2000 UT) tablet Take 1 tablet (2,000 Units total) by mouth daily. 06/27/18  Yes Tonia Ghent, MD  Coenzyme Q-10 200 MG CAPS Take 200 mg by mouth daily.   Yes [provider]  Cyanocobalamin (B-12) 5000 MCG CAPS Take 5,000 mcg by mouth daily.   Yes [provider]  cyclobenzaprine (FLEXERIL) 10 MG tablet Take 0.5-1 tablets (5-10 mg total) by mouth 2 (two) times daily as needed for muscle spasms. 03/12/22  Yes Tonia Ghent, MD  esomeprazole (NEXIUM) 40 MG capsule Take 1 capsule (40 mg total) by mouth daily at 12 noon. 08/17/21  Yes Levin Erp, PA  ezetimibe (ZETIA) 10 MG tablet TAKE 1 TABLET BY MOUTH EVERYDAY AT BEDTIME 03/18/22  Yes Tonia Ghent, MD  ferrous sulfate 325 (65 FE) MG tablet TAKE 1 TABLET BY MOUTH EVERY DAY 04/05/22  Yes Tonia Ghent, MD  fexofenadine (ALLEGRA) 180 MG tablet Take 180 mg by mouth as needed.   Yes [provider]  FLUoxetine (PROZAC) 20 MG tablet Take 20 mg by mouth 2 (two) times daily.   Yes [provider]  fluticasone (FLONASE) 50 MCG/ACT nasal spray USE 2 SPRAYS INTO THE NOSE EVERY 12 HOURS AS NEEDED FOR STUFFY NOSE 04/28/22  Yes Tonia Ghent, MD  glucose blood (ONETOUCH VERIO) test strip USE AS INSTRUCTED TO CHECK SUGAR 1 TIME DAILY 03/16/22  Yes Philemon Kingdom, MD  guaiFENesin (MUCINEX) 600  MG 12 hr tablet Take 2 tablets (1,200 mg total) by mouth 2 (two) times daily as needed for cough or to loosen phlegm. 08/25/21  Yes Chesley Mires, MD  isosorbide mononitrate (IMDUR) 30 MG 24 hr tablet Take 1 tablet (30 mg total) by mouth daily. 02/23/22  Yes Richardson Dopp T, PA-C  levothyroxine (SYNTHROID) 125 MCG tablet Take 1 tablet (125 mcg total) by mouth daily. 09/11/21  Yes Philemon Kingdom, MD  lidocaine (LIDODERM) 5 % Place 1 patch onto the skin every 12 (twelve) hours. Remove & Discard patch within 12 hours or as directed by MD 03/04/22 03/04/23 Yes Blake Divine, MD  metFORMIN (GLUCOPHAGE) 500 MG tablet Take 1 tablet (500 mg total) by mouth every evening. 03/16/22  Yes Philemon Kingdom, MD  nitroGLYCERIN (NITROSTAT) 0.4 MG SL tablet Place 1 tablet (0.4 mg total) under the tongue every 5 (five) minutes as needed for chest pain. 07/16/21  Yes Tonia Ghent, MD  nystatin (MYCOSTATIN) 100000 UNIT/ML suspension Take 5 mLs (500,000 Units total) by mouth 4 (four) times daily. 05/11/22  Yes Tonia Ghent, MD  OneTouch Delica Lancets 99991111 MISC Use 1x a day 03/16/22  Yes Philemon Kingdom, MD  potassium chloride (KLOR-CON) 10 MEQ tablet Take 1 tablet (10 mEq total) by mouth daily. Patient taking differently: Take 10 mEq by mouth daily. Takes 10 meq potassium every other day with 10 mg torsemide Takes 20 meq potassium every other day with 20 mg torsemide 02/23/22  Yes Weaver, Scott T, PA-C  rosuvastatin (CRESTOR) 10 MG tablet Take 1 tablet (10 mg total) by mouth every other day. 11/10/21  Yes Tonia Ghent, MD  Spacer/Aero Chamber Mouthpiece MISC Use with  inhaler as needed.  J44.9 09/30/17  Yes Tonia Ghent, MD  torsemide (DEMADEX) 10 MG tablet Take 1 tablet by mouth every other day alternating with 2 tablets by mouth every other day as directed 05/07/22  Yes Gollan, Kathlene November, MD    Review of Systems  Constitutional:  Positive for fatigue. Negative for appetite change.  HENT:  Negative for  congestion, postnasal drip and sore throat.   Eyes: Negative.   Respiratory:  Positive for cough and shortness of breath. Negative for chest tightness.   Cardiovascular:  Positive for leg swelling (R>L). Negative for chest pain and palpitations.  Gastrointestinal:  Negative for abdominal distention and abdominal pain.  Endocrine: Negative.   Genitourinary: Negative.   Musculoskeletal:  Positive for back pain. Negative for neck pain.  Skin: Negative.   Allergic/Immunologic: Negative.   Neurological:  Positive for dizziness and light-headedness.  Hematological:  Negative for adenopathy. Does not bruise/bleed easily.  Psychiatric/Behavioral:  Negative for dysphoric mood and sleep disturbance (adjustable bed; sleeping on 1 pillow with oxygen @ 2L). The patient is not nervous/anxious.    Vitals:   05/17/22 1312  BP: (!) 133/44  Pulse: 72  Resp: 16  SpO2: 97%  Weight: 230 lb 6 oz (104.5 kg)   Wt Readings from Last 3 Encounters:  05/17/22 230 lb 6 oz (104.5 kg)  05/07/22 229 lb (103.9 kg)  05/03/22 229 lb 1.6 oz (103.9 kg)   Lab Results  Component Value Date   CREATININE 1.33 (H) 05/03/2022   CREATININE 1.24 (H) 04/30/2022   CREATININE 1.14 (H) 04/28/2022   Physical Exam Vitals and nursing note reviewed. Chaperone present: daughter, Alexandria [name] was on the phone during visit.  Constitutional:      Appearance: Normal appearance.  HENT:     Head: Normocephalic and atraumatic.  Cardiovascular:     Rate and Rhythm: Normal rate and regular rhythm.  Pulmonary:     Effort: Pulmonary effort is normal. No respiratory distress.     Breath sounds: No wheezing, rhonchi or rales.  Abdominal:     General: There is no distension.     Palpations: Abdomen is soft.     Tenderness: There is no abdominal tenderness.  Musculoskeletal:        General: Tenderness present.     Cervical back: Normal range of motion and neck supple.     Right lower leg: Edema (2+ pitting) present.     Left lower leg:  Edema (2+ pitting) present.  Skin:    General: Skin is warm and dry.  Neurological:     General: No focal deficit present.     Mental Status: She is alert and oriented to person, place, and time.  Psychiatric:        Mood and Affect: Mood normal.        Behavior: Behavior normal.   Assessment & Plan:  1: Chronic heart failure with preserved ejection fraction- - NYHA class III - euvolemic  - weighing daily; reminded to call for an overnight weight gain of > 2 pounds or a weekly weight gain of > 5 pounds - weight down 8.6 pounds from last visit here 3 weeks ago - echo 03/29/22: EF of 55-60% with calcification on mitral valve and mild AS. Echo 02/11/21: EF of 55-60% along with mild/ moderate LAE and moderate/ severe mitral annular calcification.  - not adding salt and has been reading labels for sodium content; understands to keep it '2000mg'$  - saw cardiology Rockey Situ) 05/07/22 - h/o UTI so may not be a good SGLT2 candidate - torsemide '20mg'$  QOD  w/ '10mg'$  QOD - potassium 47mq daily - consider losartan 12.'5mg'$   - lengthy discussion of sodium content of pedialyte (~'250mg'$ / 8 oz glass); explained daily sodium intake and daily fluid intake 60-64 oz & that she needs to limit pedialyte to no more than 8 oz daily, preferably none - BMP today - BNP 03/04/22 was 88.2  2: HTN- - BP 133/44 - saw PCP ([name] 03/12/22 - BMP 05/03/22 showed sodium 138, potassium 3.9, creatinine 1.33 & GFR 39  3: COPD- - wearing oxygen at 2.5L RTC except will increase it to 3L upon exertion  4: DM- - saw endocrinology (Cruzita Lederer 03/16/22 - A1c 03/16/22 was 5.3% - metformin '500mg'$  daily  5: Lymphedema- - wearing thigh high compression socks  - not elevating her legs consistently and admits to not propping them up higher than her heart - encouraged to elevate them as much as possible & as high as possible - saw vascular (Bag[place]) 04/28/22   Return in 1 month, sooner if needed.

## 2022-05-19 ENCOUNTER — Telehealth: Payer: Self-pay | Admitting: *Deleted

## 2022-05-19 NOTE — Telephone Encounter (Signed)
CALLED PATIENT'S DAUGHTER- [name] LEBOLD TO INFORM OF CT FOR HER MOM ON 06-24-22- ARRIVAL TIME- 2:30 PM @ [facility], PATIENT TO HAVE WATER ONLY 4 HRS. PRIOR TO TEST, PATIENT'S DAUGHTER- [name] LEBOLD TO RECEIVE PHONE CALL FROM ALISON PERKINS ON 06-28-22 @ 2 PM FOR RESULTS, SPOKE WITH MS. LEBOLD AND SHE IS AWARE OF THESE APPTS. AND THIE INSTRUCTIONS

## 2022-05-20 ENCOUNTER — Encounter: Payer: Self-pay | Admitting: Internal Medicine

## 2022-06-01 DIAGNOSIS — F4323 Adjustment disorder with mixed anxiety and depressed mood: Secondary | ICD-10-CM | POA: Diagnosis not present

## 2022-06-02 ENCOUNTER — Ambulatory Visit: Payer: Commercial Managed Care - PPO

## 2022-06-10 ENCOUNTER — Telehealth: Payer: Self-pay | Admitting: Family Medicine

## 2022-06-10 NOTE — Telephone Encounter (Signed)
Called patient to schedule Medicare Annual Wellness Visit (AWV). Left message for patient to call back and schedule Medicare Annual Wellness Visit (AWV).  Last date of AWV: 02/05/2020  Please schedule an appointment at any time with [facility].  If any questions, please contact me at [phone].  Thank you ,  [place] Direct Dial: [phone]

## 2022-06-14 ENCOUNTER — Ambulatory Visit (INDEPENDENT_AMBULATORY_CARE_PROVIDER_SITE_OTHER): Payer: Medicare Other | Admitting: Surgery

## 2022-06-14 ENCOUNTER — Encounter: Payer: Self-pay | Admitting: Surgery

## 2022-06-14 VITALS — BP 111/69 | HR 79 | Temp 98.2°F | Resp 20 | Ht 65.0 in | Wt 230.0 lb

## 2022-06-14 DIAGNOSIS — I872 Venous insufficiency (chronic) (peripheral): Secondary | ICD-10-CM | POA: Diagnosis not present

## 2022-06-14 NOTE — Progress Notes (Signed)
[facility]  Patient name: Alexandria [name] MRN: [id number] DOB: 06-Dec-1935 Sex: female   REASON FOR VISIT:    Follow up  HISOTRY OF PRESENT ILLNESS:    Alexandria [name] is a 86 y.o. female who returns today for follow-up of her leg swelling.  This is worse in the right leg.  She has been wearing thigh-high compression socks which do help.  She has noticed some weeping in her wounds which were treated with Unna boots that helped.  She had a reflux evaluation which did show some saphenous vein reflux.  She is here today for further discussions.  She does not have any open wounds at this time.   PAST MEDICAL HISTORY:   Past Medical History:  Diagnosis Date   Allergy, unspecified not elsewhere classified    Anxiety    Aortic stenosis, mild 03/30/2022   TTE 03/29/2022: EF 55-60, no RWMA, GR 1 DD, normal RVSF, mild aortic stenosis (mean 10, V-max 212 cm/s, DI 0.70)   Cataract    Chronic diastolic congestive heart failure (HCC)    COPD (chronic obstructive pulmonary disease) (HCC) 03/08/1998   PFTs 12/12/2002 FEV 1 1.42 (64%) ratio 58 with no better after B2 and DLCO75%; PFTs 11/20/09 FEV1 1.50 (73%) ratio 50 no better after B2 with DLCO 62%; Hfa 50% 11/20/2009 >75%, 01/13/10 p coaching   Depression 12/07/1998   Diabetes mellitus type II 02/05/2006   Dr. Elvera Lennox with endo   Disorders of bursae and tendons in shoulder region, unspecified    Rotator cuff syndrome, right   E. coli bacteremia    Esophagitis    GERD (gastroesophageal reflux disease)    History of UTI    Hyperlipidemia 10/04/2000   Hypertension 03/08/1992   Hypothyroidism 03/08/1968   Iron deficiency anemia    Lung nodule    radiation starts 10-08-2019   Malignant neoplasm of right upper lobe of lung (HCC) 07/24/2019   Obesity    NOS   OSA (obstructive sleep apnea)    PSG 01/27/10 AHI 13, pt does not know CPAP settings   Peripheral neuropathy    Likely due  to DM per Dr. Tresa Endo hernia      FAMILY HISTORY:   Family History  Problem Relation Age of Onset   Heart disease Mother    Thyroid disease Mother    Emphysema Father        One lung   Cystic fibrosis Sister    Hyperthyroidism Sister    Osteoarthritis Brother    Hyperthyroidism Brother    Esophageal cancer Neg Hx    Cancer Neg Hx        Head or neck   Colon cancer Neg Hx    Stomach cancer Neg Hx    Breast cancer Neg Hx     SOCIAL HISTORY:   Social History   Tobacco Use   Smoking status: Former    Packs/day: 2.00    Years: 50.00    Additional pack years: 0.00    Total pack years: 100.00    Types: Cigarettes    Quit date: 02/05/2005    Years since quitting: 17.3   Smokeless tobacco: Never  Substance Use Topics   Alcohol use: Yes    Comment: occasionally     ALLERGIES:   Allergies  Allergen Reactions   Antihistamines, Diphenhydramine-Type Other (See Comments)    Able to tolerate only allegra.    Sulfonamide Derivatives Swelling    REACTION:  closed throat   Incruse Ellipta [Umeclidinium Bromide] Cough    Caused voice change and severe coughing   Clarithromycin Other (See Comments)    REACTION: diff swallowing and mouth blisters   Codeine Other (See Comments)    Unknown    Lyrica [Pregabalin] Other (See Comments)    Lack of effect for neuropathy pain.     Spiriva Handihaler [Tiotropium Bromide Monohydrate] Other (See Comments)    Voice changes   Vraylar [Cariprazine]     intolerant     CURRENT MEDICATIONS:   Current Outpatient Medications  Medication Sig Dispense Refill   albuterol (PROAIR HFA) 108 (90 Base) MCG/ACT inhaler Inhale 2 puffs into the lungs every 6 (six) hours as needed for wheezing or shortness of breath. INHALE 2 PUFFS INTO THE LUNGS EVERY 6 HOURS AS NEEDED FOR WHEEZING OR SHORTNESS OF BREATH 3 Inhaler 3   arformoterol (BROVANA) 15 MCG/2ML NEBU Take 2 mLs (15 mcg total) by nebulization at bedtime. 120 mL 6   budesonide  (PULMICORT) 0.5 MG/2ML nebulizer solution Take 2 mLs (0.5 mg total) by nebulization in the morning and at bedtime. 360 mL 3   busPIRone (BUSPAR) 15 MG tablet Take 1 tablet (15 mg total) by mouth 2 (two) times daily. With extra 1/2 tab midday (Patient taking differently: Take 15 mg by mouth 3 (three) times daily. With extra 1/2 tab midday)     Cholecalciferol (VITAMIN D) 50 MCG (2000 UT) tablet Take 1 tablet (2,000 Units total) by mouth daily.     Coenzyme Q-10 200 MG CAPS Take 200 mg by mouth daily.     Cyanocobalamin (B-12) 5000 MCG CAPS Take 5,000 mcg by mouth daily.     cyclobenzaprine (FLEXERIL) 10 MG tablet Take 0.5-1 tablets (5-10 mg total) by mouth 2 (two) times daily as needed for muscle spasms. 30 tablet 0   esomeprazole (NEXIUM) 40 MG capsule Take 1 capsule (40 mg total) by mouth daily at 12 noon. 30 capsule 11   ezetimibe (ZETIA) 10 MG tablet TAKE 1 TABLET BY MOUTH EVERYDAY AT BEDTIME 90 tablet 2   ferrous sulfate 325 (65 FE) MG tablet TAKE 1 TABLET BY MOUTH EVERY DAY 90 tablet 3   fexofenadine (ALLEGRA) 180 MG tablet Take 180 mg by mouth as needed.     FLUoxetine (PROZAC) 20 MG tablet Take 20 mg by mouth 2 (two) times daily.     fluticasone (FLONASE) 50 MCG/ACT nasal spray USE 2 SPRAYS INTO THE NOSE EVERY 12 HOURS AS NEEDED FOR STUFFY NOSE 48 mL 2   glucose blood (ONETOUCH VERIO) test strip USE AS INSTRUCTED TO CHECK SUGAR 1 TIME DAILY 100 strip 3   guaiFENesin (MUCINEX) 600 MG 12 hr tablet Take 2 tablets (1,200 mg total) by mouth 2 (two) times daily as needed for cough or to loosen phlegm.     isosorbide mononitrate (IMDUR) 30 MG 24 hr tablet Take 1 tablet (30 mg total) by mouth daily. 90 tablet 3   levothyroxine (SYNTHROID) 125 MCG tablet Take 1 tablet (125 mcg total) by mouth daily. 90 tablet 3   lidocaine (LIDODERM) 5 % Place 1 patch onto the skin every 12 (twelve) hours. Remove & Discard patch within 12 hours or as directed by MD 10 patch 0   metFORMIN (GLUCOPHAGE) 500 MG tablet  Take 1 tablet (500 mg total) by mouth every evening. 90 tablet 3   nitroGLYCERIN (NITROSTAT) 0.4 MG SL tablet Place 1 tablet (0.4 mg total) under the tongue every 5 (five) minutes  as needed for chest pain. 25 tablet 3   nystatin (MYCOSTATIN) 100000 UNIT/ML suspension Take 5 mLs (500,000 Units total) by mouth 4 (four) times daily. 120 mL 0   OneTouch Delica Lancets 33G MISC Use 1x a day 100 each 3   potassium chloride (KLOR-CON) 10 MEQ tablet Take 1 tablet (10 mEq total) by mouth daily. (Patient taking differently: Take 10 mEq by mouth daily. Takes 10 meq potassium every other day with 10 mg torsemide Takes 20 meq potassium every other day with 20 mg torsemide) 90 tablet 3   rosuvastatin (CRESTOR) 10 MG tablet Take 1 tablet (10 mg total) by mouth every other day. 45 tablet 2   Spacer/Aero Chamber Mouthpiece MISC Use with  inhaler as needed.  J44.9 1 each 0   torsemide (DEMADEX) 10 MG tablet Take 1 tablet by mouth every other day alternating with 2 tablets by mouth every other day as directed 45 tablet 5   No current facility-administered medications for this visit.    REVIEW OF SYSTEMS:   [X]  denotes positive finding, [ ]  denotes negative finding Cardiac  Comments:  Chest pain or chest pressure:    Shortness of breath upon exertion:    Short of breath when lying flat:    Irregular heart rhythm:        Vascular    Pain in calf, thigh, or hip brought on by ambulation:    Pain in feet at night that wakes you up from your sleep:     Blood clot in your veins:    Leg swelling:         Pulmonary    Oxygen at home:    Productive cough:     Wheezing:         Neurologic    Sudden weakness in arms or legs:     Sudden numbness in arms or legs:     Sudden onset of difficulty speaking or slurred speech:    Temporary loss of vision in one eye:     Problems with dizziness:         Gastrointestinal    Blood in stool:     Vomited blood:         Genitourinary    Burning when urinating:      Blood in urine:        Psychiatric    Major depression:         Hematologic    Bleeding problems:    Problems with blood clotting too easily:        Skin    Rashes or ulcers:        Constitutional    Fever or chills:      PHYSICAL EXAM:   There were no vitals filed for this visit.  GENERAL: The patient is a well-nourished female, in no acute distress. The vital signs are documented above. CARDIAC: There is a regular rate and rhythm.  VASCULAR: Bilateral edema PULMONARY: Non-labored respirations MUSCULOSKELETAL: There are no major deformities or cyanosis. NEUROLOGIC: No focal weakness or paresthesias are detected. SKIN: There are no ulcers or rashes noted. PSYCHIATRIC: The patient has a normal affect.  STUDIES:   I have reviewed the following:  Venous Reflux Times  +--------------+---------+------+-----------+------------+--------+  RIGHT        Reflux NoRefluxReflux TimeDiameter cmsComments                          Yes                                   +--------------+---------+------+-----------+------------+--------+  CFV                    yes   >1 second                       +--------------+---------+------+-----------+------------+--------+  FV prox                 yes   >1 second                       +--------------+---------+------+-----------+------------+--------+  FV mid        no                                              +--------------+---------+------+-----------+------------+--------+  FV dist       no                                              +--------------+---------+------+-----------+------------+--------+  Popliteal    no                                              +--------------+---------+------+-----------+------------+--------+  GSV at SFJ              yes    >500 ms      1.07              +--------------+---------+------+-----------+------------+--------+  GSV prox thigh           yes    >500 ms     0.458              +--------------+---------+------+-----------+------------+--------+  GSV mid thigh no                           0.486              +--------------+---------+------+-----------+------------+--------+  GSV dist thighno                           0.513              +--------------+---------+------+-----------+------------+--------+  GSV at knee   no                           0.485              +--------------+---------+------+-----------+------------+--------+  GSV prox calf no                           0.465              +--------------+---------+------+-----------+------------+--------+  SSV Pop Fossa no                           0.336              +--------------+---------+------+-----------+------------+--------+  SSV prox calf no  0.382              +--------------+---------+------+-----------+------------+--------+  SSV mid calf  no                           0.345              +--------------+---------+------+-----------+------------+--------+   MEDICAL ISSUES:   I discussed with the patient that I believe her lower extremity swelling as multiple etiologies.  Primarily this is lymphedema with superimposed venous insufficiency.  She has reflux in both the deep and superficial systems.  I told her that I did not think that treating her superficial venous reflux would have significant impact on the swelling in her leg.  I think she is going to be best managed with compression and elevation.  I am making a referral to the lymphedema clinic.  In addition, I am going to try to get her fitted for lymphedema pumps.  The patient has difficulty keeping her legs elevated because she is in a wheelchair.  She has worn compression socks for greater than 4 weeks and has also used Unna boots.  She has skin discoloration as well as a history of skin breakdown.  She has not gotten better with  conservative therapy.    Charlena CrossWells Sherian Valenza, IV, MD, FACS [facility] Tel [phone] Pager [phone]) [phone]

## 2022-06-15 ENCOUNTER — Ambulatory Visit: Payer: Commercial Managed Care - PPO | Admitting: Internal Medicine

## 2022-06-15 ENCOUNTER — Other Ambulatory Visit: Payer: Commercial Managed Care - PPO

## 2022-06-15 ENCOUNTER — Ambulatory Visit: Payer: Commercial Managed Care - PPO

## 2022-06-16 ENCOUNTER — Encounter: Payer: Self-pay | Admitting: Family

## 2022-06-16 ENCOUNTER — Inpatient Hospital Stay: Payer: Medicare Other | Attending: Internal Medicine

## 2022-06-16 ENCOUNTER — Ambulatory Visit: Payer: Medicare Other | Attending: Family | Admitting: Family

## 2022-06-16 VITALS — BP 140/47 | HR 82 | Resp 14

## 2022-06-16 DIAGNOSIS — E1122 Type 2 diabetes mellitus with diabetic chronic kidney disease: Secondary | ICD-10-CM | POA: Diagnosis not present

## 2022-06-16 DIAGNOSIS — R0602 Shortness of breath: Secondary | ICD-10-CM | POA: Diagnosis not present

## 2022-06-16 DIAGNOSIS — J4489 Other specified chronic obstructive pulmonary disease: Secondary | ICD-10-CM | POA: Diagnosis not present

## 2022-06-16 DIAGNOSIS — F419 Anxiety disorder, unspecified: Secondary | ICD-10-CM | POA: Insufficient documentation

## 2022-06-16 DIAGNOSIS — D509 Iron deficiency anemia, unspecified: Secondary | ICD-10-CM | POA: Insufficient documentation

## 2022-06-16 DIAGNOSIS — F410 Panic disorder [episodic paroxysmal anxiety] without agoraphobia: Secondary | ICD-10-CM | POA: Insufficient documentation

## 2022-06-16 DIAGNOSIS — D649 Anemia, unspecified: Secondary | ICD-10-CM

## 2022-06-16 DIAGNOSIS — Z87891 Personal history of nicotine dependence: Secondary | ICD-10-CM | POA: Diagnosis not present

## 2022-06-16 DIAGNOSIS — Z7984 Long term (current) use of oral hypoglycemic drugs: Secondary | ICD-10-CM | POA: Diagnosis not present

## 2022-06-16 DIAGNOSIS — J449 Chronic obstructive pulmonary disease, unspecified: Secondary | ICD-10-CM | POA: Insufficient documentation

## 2022-06-16 DIAGNOSIS — E785 Hyperlipidemia, unspecified: Secondary | ICD-10-CM | POA: Diagnosis not present

## 2022-06-16 DIAGNOSIS — Z9981 Dependence on supplemental oxygen: Secondary | ICD-10-CM | POA: Insufficient documentation

## 2022-06-16 DIAGNOSIS — N1832 Chronic kidney disease, stage 3b: Secondary | ICD-10-CM | POA: Diagnosis not present

## 2022-06-16 DIAGNOSIS — I350 Nonrheumatic aortic (valve) stenosis: Secondary | ICD-10-CM | POA: Insufficient documentation

## 2022-06-16 DIAGNOSIS — J439 Emphysema, unspecified: Secondary | ICD-10-CM

## 2022-06-16 DIAGNOSIS — I10 Essential (primary) hypertension: Secondary | ICD-10-CM | POA: Diagnosis not present

## 2022-06-16 DIAGNOSIS — N183 Chronic kidney disease, stage 3 unspecified: Secondary | ICD-10-CM | POA: Insufficient documentation

## 2022-06-16 DIAGNOSIS — M7989 Other specified soft tissue disorders: Secondary | ICD-10-CM | POA: Diagnosis not present

## 2022-06-16 DIAGNOSIS — M25562 Pain in left knee: Secondary | ICD-10-CM | POA: Diagnosis not present

## 2022-06-16 DIAGNOSIS — I080 Rheumatic disorders of both mitral and aortic valves: Secondary | ICD-10-CM | POA: Diagnosis not present

## 2022-06-16 DIAGNOSIS — E1142 Type 2 diabetes mellitus with diabetic polyneuropathy: Secondary | ICD-10-CM | POA: Insufficient documentation

## 2022-06-16 DIAGNOSIS — I428 Other cardiomyopathies: Secondary | ICD-10-CM | POA: Diagnosis not present

## 2022-06-16 DIAGNOSIS — Z79899 Other long term (current) drug therapy: Secondary | ICD-10-CM | POA: Insufficient documentation

## 2022-06-16 DIAGNOSIS — I5032 Chronic diastolic (congestive) heart failure: Secondary | ICD-10-CM | POA: Insufficient documentation

## 2022-06-16 DIAGNOSIS — I872 Venous insufficiency (chronic) (peripheral): Secondary | ICD-10-CM | POA: Insufficient documentation

## 2022-06-16 DIAGNOSIS — E039 Hypothyroidism, unspecified: Secondary | ICD-10-CM | POA: Insufficient documentation

## 2022-06-16 DIAGNOSIS — K2100 Gastro-esophageal reflux disease with esophagitis, without bleeding: Secondary | ICD-10-CM | POA: Insufficient documentation

## 2022-06-16 DIAGNOSIS — R5383 Other fatigue: Secondary | ICD-10-CM | POA: Diagnosis not present

## 2022-06-16 DIAGNOSIS — G4733 Obstructive sleep apnea (adult) (pediatric): Secondary | ICD-10-CM | POA: Insufficient documentation

## 2022-06-16 DIAGNOSIS — R42 Dizziness and giddiness: Secondary | ICD-10-CM | POA: Insufficient documentation

## 2022-06-16 DIAGNOSIS — I110 Hypertensive heart disease with heart failure: Secondary | ICD-10-CM | POA: Insufficient documentation

## 2022-06-16 DIAGNOSIS — M25561 Pain in right knee: Secondary | ICD-10-CM | POA: Diagnosis not present

## 2022-06-16 DIAGNOSIS — E1136 Type 2 diabetes mellitus with diabetic cataract: Secondary | ICD-10-CM | POA: Diagnosis not present

## 2022-06-16 LAB — IRON AND TIBC
Iron: 35 ug/dL (ref 28–170)
Saturation Ratios: 9 % — ABNORMAL LOW (ref 10.4–31.8)
TIBC: 391 ug/dL (ref 250–450)
UIBC: 356 ug/dL

## 2022-06-16 LAB — CBC WITH DIFFERENTIAL (CANCER CENTER ONLY)
Abs Immature Granulocytes: 0.06 10*3/uL (ref 0.00–0.07)
Basophils Absolute: 0 10*3/uL (ref 0.0–0.1)
Basophils Relative: 0 %
Eosinophils Absolute: 0.1 10*3/uL (ref 0.0–0.5)
Eosinophils Relative: 1 %
HCT: 24.9 % — ABNORMAL LOW (ref 36.0–46.0)
Hemoglobin: 7.2 g/dL — ABNORMAL LOW (ref 12.0–15.0)
Immature Granulocytes: 1 %
Lymphocytes Relative: 11 %
Lymphs Abs: 0.7 10*3/uL (ref 0.7–4.0)
MCH: 29.5 pg (ref 26.0–34.0)
MCHC: 28.9 g/dL — ABNORMAL LOW (ref 30.0–36.0)
MCV: 102 fL — ABNORMAL HIGH (ref 80.0–100.0)
Monocytes Absolute: 0.4 10*3/uL (ref 0.1–1.0)
Monocytes Relative: 6 %
Neutro Abs: 5.6 10*3/uL (ref 1.7–7.7)
Neutrophils Relative %: 81 %
Platelet Count: 188 10*3/uL (ref 150–400)
RBC: 2.44 MIL/uL — ABNORMAL LOW (ref 3.87–5.11)
RDW: 14.6 % (ref 11.5–15.5)
WBC Count: 6.9 10*3/uL (ref 4.0–10.5)
nRBC: 0 % (ref 0.0–0.2)

## 2022-06-16 LAB — BASIC METABOLIC PANEL - CANCER CENTER ONLY
Anion gap: 7 (ref 5–15)
BUN: 20 mg/dL (ref 8–23)
CO2: 30 mmol/L (ref 22–32)
Calcium: 8.5 mg/dL — ABNORMAL LOW (ref 8.9–10.3)
Chloride: 102 mmol/L (ref 98–111)
Creatinine: 0.96 mg/dL (ref 0.44–1.00)
GFR, Estimated: 58 mL/min — ABNORMAL LOW (ref >60)
Glucose, Bld: 192 mg/dL — ABNORMAL HIGH (ref 70–99)
Potassium: 4 mmol/L (ref 3.5–5.1)
Sodium: 139 mmol/L (ref 135–145)

## 2022-06-16 LAB — VITAMIN B12: Vitamin B-12: 303 pg/mL (ref 180–914)

## 2022-06-16 LAB — FERRITIN: Ferritin: 26 ng/mL (ref 11–307)

## 2022-06-16 LAB — LACTATE DEHYDROGENASE: LDH: 127 U/L (ref 98–192)

## 2022-06-16 NOTE — Progress Notes (Signed)
Patient ID: Alexandria [name], female    DOB: [birth year], 87 y.o.   MRN: [id number]  Primary cardiologist: Julien Nordmann, MD (last seen 03/24) PCP: Joaquim Nam, MD (last seen 01/24)  HPI  Alexandria [name] is a 87 y/o female with a history of lung cancer, DM, hyperlipidemia, HTN, thyroid disease, anxiety, COPD, GERD, anemia, OSA, previous tobacco use and chronic heart failure.   Echo 03/29/22: EF of 55-60% with calcification on mitral valve and mild AS. Echo 02/11/21: EF of 55-60% along with mild/ moderate LAE and moderate/ severe mitral annular calcification.   Was in the ED 03/04/22 due to LBP. Was in the ED 02/17/22 due to peripheral edema.   She presents today for a HF f/u visit with a chief complaint of moderate SOB with little exertion. Chronic in nature although does seem to be worsening recently. Has associated fatigue, chest tightness (after eating/ drinking), cough, pedal edema, constant dizziness and anxiety/ panic attacks along with this.   Is having worsening anxiety along with panic attacks which have become more pronounced over the last month. She says that she gets anxious/ SOB every time she gets up even if just transferring from wheelchair to commode. It has become so bad that she now has to have someone with her 24/7.   Wearing bilateral thigh high TED hose with continued edema although does not have them on today. Waiting to hear about compression boot order that vascular just put in at last visit 2 days ago.    Past Medical History:  Diagnosis Date   Allergy, unspecified not elsewhere classified    Anxiety    Aortic stenosis, mild 03/30/2022   TTE 03/29/2022: EF 55-60, no RWMA, GR 1 DD, normal RVSF, mild aortic stenosis (mean 10, V-max 212 cm/s, DI 0.70)   Cataract    Chronic diastolic congestive heart failure    COPD (chronic obstructive pulmonary disease) 03/08/1998   PFTs 12/12/2002 FEV 1 1.42 (64%) ratio 58 with no better after B2 and DLCO75%; PFTs 11/20/09 FEV1 1.50  (73%) ratio 50 no better after B2 with DLCO 62%; Hfa 50% 11/20/2009 >75%, 01/13/10 p coaching   Depression 12/07/1998   Diabetes mellitus type II 02/05/2006   Dr. Elvera Lennox with endo   Disorders of bursae and tendons in shoulder region, unspecified    Rotator cuff syndrome, right   E. coli bacteremia    Esophagitis    GERD (gastroesophageal reflux disease)    History of UTI    Hyperlipidemia 10/04/2000   Hypertension 03/08/1992   Hypothyroidism 03/08/1968   Iron deficiency anemia    Lung nodule    radiation starts 10-08-2019   Malignant neoplasm of right upper lobe of lung 07/24/2019   Obesity    NOS   OSA (obstructive sleep apnea)    PSG 01/27/10 AHI 13, pt does not know CPAP settings   Peripheral neuropathy    Likely due to DM per Dr. Tresa Endo hernia    Past Surgical History:  Procedure Laterality Date   ABD U/S  03/19/1999   Nml x2 foci in liver   ADENOSINE MYOVIEW  06/02/2007   Nml   CARDIOLITE PERSANTINE  08/24/2000   Nml   CAROTID U/S  08/24/2000   1-39% ICA stenosis   CAROTID U/S  06/02/2007   No apprec change    CARPAL TUNNEL RELEASE  12/1997   Right   CESAREAN SECTION     x2 [place]ree[place]/ repeat   CHOLECYSTECTOMY  1997  COLONOSCOPY WITH PROPOFOL N/A 02/12/2016   Procedure: COLONOSCOPY WITH PROPOFOL;  Surgeon: Rachael Fee, MD;  Location: [facility];  Service: Endoscopy;  Laterality: N/A;   CT ABD W & PELVIS WO/W CM     Abd hemangiomas of liver, 1 cm R renal cyst   DENTAL SURGERY  2016   Implants   DEXA  07/03/2003   Nml   ESOPHAGOGASTRODUODENOSCOPY  12/05/1997   Nml (due to hoarseness)   ESOPHAGOGASTRODUODENOSCOPY (EGD) WITH PROPOFOL N/A 02/12/2016   Procedure: ESOPHAGOGASTRODUODENOSCOPY (EGD) WITH PROPOFOL;  Surgeon: Rachael Fee, MD;  Location: [facility];  Service: Endoscopy;  Laterality: N/A;   GALLBLADDER SURGERY     HERNIA REPAIR  01/24/2009   Lap Ventr w/ Lysis of adhesions (Dr. Dwain Sarna)   knee arthroscopic surgery  years ago   right    ROTATOR CUFF REPAIR  1984   Right, [place]   SHOULDER OPEN ROTATOR CUFF REPAIR  02/08/2012   Procedure: ROTATOR CUFF REPAIR SHOULDER OPEN;  Surgeon: Drucilla Schmidt, MD;  Location: [facility];  Service: Orthopedics;  Laterality: Left;  Left Shoulder Open Anterior Acrominectomy Rotator Cuff Repair Open Distal Clavicle Resection ,tissue mend graft, and repair of biceps tendon   THUMB RELEASE  12/1997   Right   TONSILLECTOMY     TOTAL ABDOMINAL HYSTERECTOMY  1985   Due to dysmennorhea   US ECHOCARDIOGRAPHY  06/02/2007   Family History  Problem Relation Age of Onset   Heart disease Mother    Thyroid disease Mother    Emphysema Father        One lung   Cystic fibrosis Sister    Hyperthyroidism Sister    Osteoarthritis Brother    Hyperthyroidism Brother    Esophageal cancer Neg Hx    Cancer Neg Hx        Head or neck   Colon cancer Neg Hx    Stomach cancer Neg Hx    Breast cancer Neg Hx    Social History   Tobacco Use   Smoking status: Former    Packs/day: 2.00    Years: 50.00    Additional pack years: 0.00    Total pack years: 100.00    Types: Cigarettes    Quit date: 02/05/2005    Years since quitting: 17.3   Smokeless tobacco: Never  Substance Use Topics   Alcohol use: Yes    Comment: occasionally   Allergies  Allergen Reactions   Antihistamines, Diphenhydramine-Type Other (See Comments)    Able to tolerate only allegra.    Sulfonamide Derivatives Swelling    REACTION: closed throat   Incruse Ellipta [Umeclidinium Bromide] Cough    Caused voice change and severe coughing   Clarithromycin Other (See Comments)    REACTION: diff swallowing and mouth blisters   Codeine Other (See Comments)    Unknown    Lyrica [Pregabalin] Other (See Comments)    Lack of effect for neuropathy pain.     Spiriva Handihaler [Tiotropium Bromide Monohydrate] Other (See Comments)    Voice changes   Vraylar [Cariprazine]     intolerant   Prior to Admission medications   Medication Sig  Start Date End Date Taking? Authorizing Provider  albuterol (PROAIR HFA) 108 (90 Base) MCG/ACT inhaler Inhale 2 puffs into the lungs every 6 (six) hours as needed for wheezing or shortness of breath. INHALE 2 PUFFS INTO THE LUNGS EVERY 6 HOURS AS NEEDED FOR WHEEZING OR SHORTNESS OF BREATH 04/19/18  Yes Coralyn Helling, MD  arformoterol [facility])  15 MCG/2ML NEBU Take 2 mLs (15 mcg total) by nebulization at bedtime. 12/01/21  Yes Coralyn Helling, MD  budesonide (PULMICORT) 0.5 MG/2ML nebulizer solution Take 2 mLs (0.5 mg total) by nebulization in the morning and at bedtime. 11/20/21  Yes Coralyn Helling, MD  busPIRone (BUSPAR) 15 MG tablet Take 1 tablet (15 mg total) by mouth 2 (two) times daily. With extra 1/2 tab midday Patient taking differently: Take 15 mg by mouth 3 (three) times daily. With extra 1/2 tab midday 12/31/21  Yes Joaquim Nam, MD  Cholecalciferol (VITAMIN D) 50 MCG (2000 UT) tablet Take 1 tablet (2,000 Units total) by mouth daily. 06/27/18  Yes Joaquim Nam, MD  Coenzyme Q-10 200 MG CAPS Take 200 mg by mouth daily.   Yes [provider]  Cyanocobalamin (B-12) 5000 MCG CAPS Take 5,000 mcg by mouth daily.   Yes [provider]  esomeprazole (NEXIUM) 40 MG capsule Take 1 capsule (40 mg total) by mouth daily at 12 noon. 08/17/21  Yes Unk Lightning, PA  ezetimibe (ZETIA) 10 MG tablet TAKE 1 TABLET BY MOUTH EVERYDAY AT BEDTIME 03/18/22  Yes Joaquim Nam, MD  ferrous sulfate 325 (65 FE) MG tablet TAKE 1 TABLET BY MOUTH EVERY DAY 04/05/22  Yes Joaquim Nam, MD  fexofenadine (ALLEGRA) 180 MG tablet Take 180 mg by mouth as needed.   Yes [provider]  FLUoxetine (PROZAC) 20 MG tablet Take 20 mg by mouth 2 (two) times daily.   Yes [provider]  fluticasone (FLONASE) 50 MCG/ACT nasal spray USE 2 SPRAYS INTO THE NOSE EVERY 12 HOURS AS NEEDED FOR STUFFY NOSE 04/28/22  Yes Joaquim Nam, MD  glucose blood (ONETOUCH VERIO) test strip USE AS  INSTRUCTED TO CHECK SUGAR 1 TIME DAILY 03/16/22  Yes Carlus Pavlov, MD  guaiFENesin (MUCINEX) 600 MG 12 hr tablet Take 2 tablets (1,200 mg total) by mouth 2 (two) times daily as needed for cough or to loosen phlegm. 08/25/21  Yes Coralyn Helling, MD  isosorbide mononitrate (IMDUR) 30 MG 24 hr tablet Take 1 tablet (30 mg total) by mouth daily. 02/23/22  Yes Tereso Newcomer T, PA-C  levothyroxine (SYNTHROID) 125 MCG tablet Take 1 tablet (125 mcg total) by mouth daily. 09/11/21  Yes Carlus Pavlov, MD  lidocaine (LIDODERM) 5 % Place 1 patch onto the skin every 12 (twelve) hours. Remove & Discard patch within 12 hours or as directed by MD 03/04/22 03/04/23 Yes Chesley Noon, MD  magnesium 30 MG tablet Take 30 mg by mouth 2 (two) times daily.   Yes [provider]  metFORMIN (GLUCOPHAGE) 500 MG tablet Take 1 tablet (500 mg total) by mouth every evening. 03/16/22  Yes Carlus Pavlov, MD  nitroGLYCERIN (NITROSTAT) 0.4 MG SL tablet Place 1 tablet (0.4 mg total) under the tongue every 5 (five) minutes as needed for chest pain. 07/16/21  Yes Joaquim Nam, MD  nystatin (MYCOSTATIN) 100000 UNIT/ML suspension Take 5 mLs (500,000 Units total) by mouth 4 (four) times daily. 05/11/22  Yes Joaquim Nam, MD  OneTouch Delica Lancets 33G MISC Use 1x a day 03/16/22  Yes Carlus Pavlov, MD  potassium chloride (KLOR-CON) 10 MEQ tablet Take 1 tablet (10 mEq total) by mouth daily. Patient taking differently: Take 10 mEq by mouth daily. Takes 10 meq potassium every other day with 10 mg torsemide Takes 20 meq potassium every other day with 20 mg torsemide 02/23/22  Yes Weaver, Scott T, PA-C  rosuvastatin (CRESTOR) 10 MG  tablet Take 1 tablet (10 mg total) by mouth every other day. 11/10/21  Yes Joaquim Nam, MD  Spacer/Aero Chamber Mouthpiece MISC Use with  inhaler as needed.  J44.9 09/30/17  Yes Joaquim Nam, MD  torsemide (DEMADEX) 20 MG tablet Take 20 mg by mouth daily. 05/21/22  Yes [provider]  cyclobenzaprine (FLEXERIL) 10 MG tablet Take 0.5-1 tablets (5-10 mg total) by mouth 2 (two) times daily as needed for muscle spasms. Patient not taking: Reported on 06/16/2022 03/12/22   Joaquim Nam, MD   Review of Systems  Constitutional:  Positive for fatigue. Negative for appetite change.  HENT:  Negative for congestion, postnasal drip and sore throat.   Eyes: Negative.   Respiratory:  Positive for cough, chest tightness (after eating/ drinking) and shortness of breath.   Cardiovascular:  Positive for leg swelling (R>L). Negative for chest pain and palpitations.  Gastrointestinal:  Negative for abdominal distention and abdominal pain.  Endocrine: Negative.   Genitourinary: Negative.   Musculoskeletal:  Positive for back pain. Negative for neck pain.  Skin: Negative.   Allergic/Immunologic: Negative.   Neurological:  Positive for dizziness and light-headedness.  Hematological:  Negative for adenopathy. Does not bruise/bleed easily.  Psychiatric/Behavioral:  Negative for dysphoric mood and sleep disturbance (adjustable bed; sleeping on 1 pillow with oxygen @ 2L). The patient is nervous/anxious (panic attacks).    Vitals:   06/16/22 1313  BP: (!) 140/47  Pulse: 82  Resp: 14  SpO2: 90%   Wt Readings from Last 3 Encounters:  06/14/22 230 lb (104.3 kg)  05/17/22 230 lb 6 oz (104.5 kg)  05/07/22 229 lb (103.9 kg)   Lab Results  Component Value Date   CREATININE 1.08 (H) 05/17/2022   CREATININE 1.33 (H) 05/03/2022   CREATININE 1.24 (H) 04/30/2022   Physical Exam Vitals and nursing note reviewed.  Constitutional:      Appearance: Normal appearance.  HENT:     Head: Normocephalic and atraumatic.  Cardiovascular:     Rate and Rhythm: Normal rate and regular rhythm.  Pulmonary:     Effort: Pulmonary effort is normal. No respiratory distress.     Breath sounds: No wheezing, rhonchi or rales.  Abdominal:     General: There is no distension.     Palpations: Abdomen is  soft.     Tenderness: There is no abdominal tenderness.  Musculoskeletal:        General: Tenderness present.     Cervical back: Normal range of motion and neck supple.     Right lower leg: Edema (2+ pitting/ woody appearance) present.     Left lower leg: Edema (2+ pitting/ woody appearance) present.  Skin:    General: Skin is warm and dry.  Neurological:     General: No focal deficit present.     Mental Status: She is alert and oriented to person, place, and time.  Psychiatric:        Mood and Affect: Mood is anxious.        Behavior: Behavior normal.   Assessment & Plan:  1: NICM with preserved ejection fraction- - NYHA class III - euvolemic  - trying to weigh but gets considerably SOB; reminded to call for an overnight weight gain of > 2 pounds or a weekly weight gain of > 5 pounds - patient says she can't be weighed today; weight at vascular 2 days ago was the same as here last time (230 lbs) - echo 03/29/22: EF of 55-60%  with calcification on mitral valve and mild AS. Echo 02/11/21: EF of 55-60% along with mild/ moderate LAE and moderate/ severe mitral annular calcification.  - not adding salt and has been reading labels for sodium content; understands to keep it 2000mg  - saw cardiology Mariah Milling([place]) 05/07/22 - h/o UTI so may not be a good SGLT2 candidate - continue torsemide 20mg  daily - continue potassium  - consider losartan 12.5mg   - ReDs clip reading today was normal at 22% so explained that, most likely, here SOB is not related to her HF - BNP 03/04/22 was 88.2  2: HTN- - BP 140/47 - saw PCP Para March([place]) 03/12/22 - BMP 05/17/22 showed sodium 139, potassium 3.9, creatinine 1.08 & GFR 51; getting labs done today via the cancer center  3: COPD- - wearing oxygen at 3L RTC  - sees pulmonology next week  4: DM- - saw endocrinology Elvera Lennox([place]) 03/16/22 - A1c 03/16/22 was 5.3% - metformin 500mg  daily  5: Chronic venous insufficiency- - wearing thigh high compression socks although  doesn't have them on today - not elevating her legs consistently and admits to not propping them up higher than her heart - encouraged to elevate them as much as possible & as high as possible - saw vascular Myra Gianotti([place]) 04/24 & lymphedema clinic was discussed and also going to try and get compression boots for her  6: Anxiety/ panic attacks- - she reports that this is worsening where she has to have someone with her 24/7 - got up to the bathroom while in the office and became SOB/ anxious - instructed her on proper breathing techniques (in through nose and out through mouth) to slow her breathing and she quickly recovered - will reach out to PCP to advise to see if any assistance can be given to her about this  Medication list reviewed.   Return in 3 months, sooner if needed

## 2022-06-16 NOTE — Progress Notes (Signed)
   06/16/22 1329  ReDS Vest / Clip  Station Marker B  Ruler Value 44  ReDS Value Range < 36  ReDS Actual Value 22

## 2022-06-17 ENCOUNTER — Other Ambulatory Visit: Payer: Self-pay

## 2022-06-18 ENCOUNTER — Inpatient Hospital Stay (HOSPITAL_BASED_OUTPATIENT_CLINIC_OR_DEPARTMENT_OTHER): Payer: Medicare Other | Admitting: Internal Medicine

## 2022-06-18 ENCOUNTER — Encounter: Payer: Self-pay | Admitting: Internal Medicine

## 2022-06-18 ENCOUNTER — Inpatient Hospital Stay: Payer: Medicare Other

## 2022-06-18 VITALS — BP 114/65 | HR 71 | Temp 96.1°F | Resp 20 | Ht 65.0 in

## 2022-06-18 VITALS — BP 136/49 | HR 67

## 2022-06-18 DIAGNOSIS — D649 Anemia, unspecified: Secondary | ICD-10-CM

## 2022-06-18 DIAGNOSIS — M170 Bilateral primary osteoarthritis of knee: Secondary | ICD-10-CM | POA: Diagnosis not present

## 2022-06-18 DIAGNOSIS — D509 Iron deficiency anemia, unspecified: Secondary | ICD-10-CM | POA: Diagnosis not present

## 2022-06-18 DIAGNOSIS — M25561 Pain in right knee: Secondary | ICD-10-CM | POA: Diagnosis not present

## 2022-06-18 DIAGNOSIS — E1122 Type 2 diabetes mellitus with diabetic chronic kidney disease: Secondary | ICD-10-CM | POA: Diagnosis not present

## 2022-06-18 DIAGNOSIS — F410 Panic disorder [episodic paroxysmal anxiety] without agoraphobia: Secondary | ICD-10-CM | POA: Diagnosis not present

## 2022-06-18 DIAGNOSIS — N183 Chronic kidney disease, stage 3 unspecified: Secondary | ICD-10-CM | POA: Diagnosis not present

## 2022-06-18 DIAGNOSIS — M25562 Pain in left knee: Secondary | ICD-10-CM | POA: Diagnosis not present

## 2022-06-18 MED ORDER — SODIUM CHLORIDE 0.9 % IV SOLN
Freq: Once | INTRAVENOUS | Status: AC
  Filled 2022-06-18: qty 250

## 2022-06-18 MED ORDER — METHYLPREDNISOLONE SODIUM SUCC 125 MG IJ SOLR
40.0000 mg | Freq: Once | INTRAMUSCULAR | Status: AC
  Administered 2022-06-18: 40 mg via INTRAVENOUS
  Filled 2022-06-18: qty 2

## 2022-06-18 MED ORDER — SODIUM CHLORIDE 0.9 % IV SOLN
200.0000 mg | Freq: Once | INTRAVENOUS | Status: AC
  Administered 2022-06-18: 200 mg via INTRAVENOUS
  Filled 2022-06-18: qty 200

## 2022-06-18 NOTE — Progress Notes (Signed)
States she is at her lowest point. She has had up to 5 panic attacks a day. Unable to do for herself as much as she used to. Has had feelings of wanting to "pass out". Friend that is with her states she can see her physically shake as she is trying to use the restroom. Having bilateral leg swelling.

## 2022-06-18 NOTE — Progress Notes (Signed)
[facility] CONSULT NOTE  Patient Care Team: Joaquim Nam, MD as PCP - General Mariah Milling, Tollie Pizza, MD as Consulting Physician (Cardiology) Oneta Rack, NP as Nurse Practitioner (Adult Health Nurse Practitioner) Earna Coder, MD as Consulting Physician (Oncology)  CHIEF COMPLAINTS/PURPOSE OF CONSULTATION: ANEMIA  HEMATOLOGY HISTORY:  # CHRONIC INTERMITTENT ANEMIA  [since 2010]- Hb SEP 2022- 8; previously on PO Iron- ; WBC/platelets- Normal; MCV- 90s. EGD- > 15 years; /Colonoscopy:4 years- [Dr.Jacob;; in [place]] capsule-? Bone marrow Biopsy-none; OCT 2022- SEVERE IRON DEFICIENCY; myeloma work-up/hemolysis work-up negative; JAN 2023-CT scan duodenal thickening-recommend GI evaluation  #   AUG 2021- RIGHT LUNG stage I non-small cell lung cancer-  s/p SBRT   [Dr.Moody; [place]]; NOV 2022- CT chest-no recurrent disease.  # COPD on 2.5 lit/day [Dr.Sood]; diastolic CHF/ OCT 2022-Low BP [needing to come off losartan- Dr.Gollan]; peripheral neuropathy.  HISTORY OF PRESENTING ILLNESS: Alone; 2.5 Lit/ O2 24 x7. In wheel chair.  patient's  friend in the room.   Alexandria [name] 87 y.o.  female with chronic respiratory failure on oxygen and severe anemia secondary to iron deficiency of unclear etiology vs others s/p venofer is here for follow-up.  Patient states she is at her lowest point. She has had up to 5 panic attacks a day. Unable to do for herself as much as she used to. Has had feelings of wanting to "pass out".   Friend that is with her states she can see her physically shake as she is trying to use the restroom.   Patient complains of having bilateral leg swelling. Her legs are compression wrapped by [place] [facility]. Never sees blood in her stool. Pt lives alone.   Review of Systems  Constitutional:  Positive for malaise/fatigue. Negative for chills, diaphoresis, fever and weight loss.  HENT:  Negative for nosebleeds and sore throat.   Eyes:   Negative for double vision.  Respiratory:  Positive for cough and shortness of breath. Negative for hemoptysis, sputum production and wheezing.   Cardiovascular:  Negative for chest pain, palpitations, orthopnea and leg swelling.  Gastrointestinal:  Negative for abdominal pain, blood in stool, constipation, diarrhea, heartburn, melena, nausea and vomiting.  Genitourinary:  Negative for dysuria, frequency and urgency.  Musculoskeletal:  Negative for back pain and joint pain.  Skin: Negative.  Negative for itching and rash.  Neurological:  Positive for dizziness. Negative for tingling, focal weakness, weakness and headaches.  Endo/Heme/Allergies:  Does not bruise/bleed easily.  Psychiatric/Behavioral:  Negative for depression. The patient is not nervous/anxious and does not have insomnia.     MEDICAL HISTORY:  Past Medical History:  Diagnosis Date   Allergy, unspecified not elsewhere classified    Anxiety    Aortic stenosis, mild 03/30/2022   TTE 03/29/2022: EF 55-60, no RWMA, GR 1 DD, normal RVSF, mild aortic stenosis (mean 10, V-max 212 cm/s, DI 0.70)   Cataract    Chronic diastolic congestive heart failure    COPD (chronic obstructive pulmonary disease) 03/08/1998   PFTs 12/12/2002 FEV 1 1.42 (64%) ratio 58 with no better after B2 and DLCO75%; PFTs 11/20/09 FEV1 1.50 (73%) ratio 50 no better after B2 with DLCO 62%; Hfa 50% 11/20/2009 >75%, 01/13/10 p coaching   Depression 12/07/1998   Diabetes mellitus type II 02/05/2006   Dr. Elvera Lennox with endo   Disorders of bursae and tendons in shoulder region, unspecified    Rotator cuff syndrome, right   E. coli bacteremia    Esophagitis  GERD (gastroesophageal reflux disease)    History of UTI    Hyperlipidemia 10/04/2000   Hypertension 03/08/1992   Hypothyroidism 03/08/1968   Iron deficiency anemia    Lung nodule    radiation starts 10-08-2019   Malignant neoplasm of right upper lobe of lung 07/24/2019   Obesity    NOS   OSA (obstructive  sleep apnea)    PSG 01/27/10 AHI 13, pt does not know CPAP settings   Peripheral neuropathy    Likely due to DM per Dr. Tresa Endo hernia     SURGICAL HISTORY: Past Surgical History:  Procedure Laterality Date   ABD U/S  03/19/1999   Nml x2 foci in liver   ADENOSINE MYOVIEW  06/02/2007   Nml   CARDIOLITE PERSANTINE  08/24/2000   Nml   CAROTID U/S  08/24/2000   1-39% ICA stenosis   CAROTID U/S  06/02/2007   No apprec change    CARPAL TUNNEL RELEASE  12/1997   Right   CESAREAN SECTION     x2 [place]/ repeat   CHOLECYSTECTOMY  1997   COLONOSCOPY WITH PROPOFOL N/A 02/12/2016   Procedure: COLONOSCOPY WITH PROPOFOL;  Surgeon: Rachael Fee, MD;  Location: [facility];  Service: Endoscopy;  Laterality: N/A;   CT ABD W & PELVIS WO/W CM     Abd hemangiomas of liver, 1 cm R renal cyst   DENTAL SURGERY  2016   Implants   DEXA  07/03/2003   Nml   ESOPHAGOGASTRODUODENOSCOPY  12/05/1997   Nml (due to hoarseness)   ESOPHAGOGASTRODUODENOSCOPY (EGD) WITH PROPOFOL N/A 02/12/2016   Procedure: ESOPHAGOGASTRODUODENOSCOPY (EGD) WITH PROPOFOL;  Surgeon: Rachael Fee, MD;  Location: [facility];  Service: Endoscopy;  Laterality: N/A;   GALLBLADDER SURGERY     HERNIA REPAIR  01/24/2009   Lap Ventr w/ Lysis of adhesions (Dr. Dwain Sarna)   knee arthroscopic surgery  years ago   right   ROTATOR CUFF REPAIR  1984   Right, [place]   SHOULDER OPEN ROTATOR CUFF REPAIR  02/08/2012   Procedure: ROTATOR CUFF REPAIR SHOULDER OPEN;  Surgeon: Drucilla Schmidt, MD;  Location: [facility];  Service: Orthopedics;  Laterality: Left;  Left Shoulder Open Anterior Acrominectomy Rotator Cuff Repair Open Distal Clavicle Resection ,tissue mend graft, and repair of biceps tendon   THUMB RELEASE  12/1997   Right   TONSILLECTOMY     TOTAL ABDOMINAL HYSTERECTOMY  1985   Due to dysmennorhea   US ECHOCARDIOGRAPHY  06/02/2007    SOCIAL HISTORY: Social History   Socioeconomic History   Marital status: Widowed     Spouse name: Not on file   Number of children: 2   Years of education: Not on file   Highest education level: Not on file  Occupational History   Occupation: Retired    Associate Professor: RETIRED  Tobacco Use   Smoking status: Former    Packs/day: 2.00    Years: 50.00    Additional pack years: 0.00    Total pack years: 100.00    Types: Cigarettes    Quit date: 02/05/2005    Years since quitting: 17.3   Smokeless tobacco: Never  Vaping Use   Vaping Use: Never used  Substance and Sexual Activity   Alcohol use: Yes    Comment: occasionally   Drug use: Never   Sexual activity: Not Currently  Other Topics Concern   Not on file  Social History Narrative   Widowed 2023, 2 children; Enjoys painting  Quit smoking in 2007; no alcohol; lives in [place]; [name]/d lives in [place]. Had own business. Dont drive- sec to neuropathy.    Social Determinants of Health   Financial Resource Strain: Not on file  Food Insecurity: Not on file  Transportation Needs: Not on file  Physical Activity: Not on file  Stress: Not on file  Social Connections: Not on file  Intimate Partner Violence: Not on file    FAMILY HISTORY: Family History  Problem Relation Age of Onset   Heart disease Mother    Thyroid disease Mother    Emphysema Father        One lung   Cystic fibrosis Sister    Hyperthyroidism Sister    Osteoarthritis Brother    Hyperthyroidism Brother    Esophageal cancer Neg Hx    Cancer Neg Hx        Head or neck   Colon cancer Neg Hx    Stomach cancer Neg Hx    Breast cancer Neg Hx     ALLERGIES:  is allergic to antihistamines, diphenhydramine-type; sulfonamide derivatives; incruse ellipta [umeclidinium bromide]; clarithromycin; codeine; lyrica [pregabalin]; spiriva handihaler [tiotropium bromide monohydrate]; and vraylar [cariprazine].  MEDICATIONS:  Current Outpatient Medications  Medication Sig Dispense Refill   albuterol (PROAIR HFA) 108 (90 Base) MCG/ACT inhaler  Inhale 2 puffs into the lungs every 6 (six) hours as needed for wheezing or shortness of breath. INHALE 2 PUFFS INTO THE LUNGS EVERY 6 HOURS AS NEEDED FOR WHEEZING OR SHORTNESS OF BREATH 3 Inhaler 3   arformoterol (BROVANA) 15 MCG/2ML NEBU Take 2 mLs (15 mcg total) by nebulization at bedtime. 120 mL 6   budesonide (PULMICORT) 0.5 MG/2ML nebulizer solution Take 2 mLs (0.5 mg total) by nebulization in the morning and at bedtime. 360 mL 3   busPIRone (BUSPAR) 15 MG tablet Take 1 tablet (15 mg total) by mouth 2 (two) times daily. With extra 1/2 tab midday (Patient taking differently: Take 15 mg by mouth 3 (three) times daily. With extra 1/2 tab midday)     Cholecalciferol (VITAMIN D) 50 MCG (2000 UT) tablet Take 1 tablet (2,000 Units total) by mouth daily.     Coenzyme Q-10 200 MG CAPS Take 200 mg by mouth daily.     Cyanocobalamin (B-12) 5000 MCG CAPS Take 5,000 mcg by mouth daily.     esomeprazole (NEXIUM) 40 MG capsule Take 1 capsule (40 mg total) by mouth daily at 12 noon. 30 capsule 11   ezetimibe (ZETIA) 10 MG tablet TAKE 1 TABLET BY MOUTH EVERYDAY AT BEDTIME 90 tablet 2   ferrous sulfate 325 (65 FE) MG tablet TAKE 1 TABLET BY MOUTH EVERY DAY 90 tablet 3   fexofenadine (ALLEGRA) 180 MG tablet Take 180 mg by mouth as needed.     FLUoxetine (PROZAC) 20 MG tablet Take 20 mg by mouth 2 (two) times daily.     fluticasone (FLONASE) 50 MCG/ACT nasal spray USE 2 SPRAYS INTO THE NOSE EVERY 12 HOURS AS NEEDED FOR STUFFY NOSE 48 mL 2   glucose blood (ONETOUCH VERIO) test strip USE AS INSTRUCTED TO CHECK SUGAR 1 TIME DAILY 100 strip 3   guaiFENesin (MUCINEX) 600 MG 12 hr tablet Take 2 tablets (1,200 mg total) by mouth 2 (two) times daily as needed for cough or to loosen phlegm.     isosorbide mononitrate (IMDUR) 30 MG 24 hr tablet Take 1 tablet (30 mg total) by mouth daily. 90 tablet 3   levothyroxine (SYNTHROID) 125 MCG tablet Take  1 tablet (125 mcg total) by mouth daily. 90 tablet 3   lidocaine (LIDODERM) 5  % Place 1 patch onto the skin every 12 (twelve) hours. Remove & Discard patch within 12 hours or as directed by MD 10 patch 0   magnesium 30 MG tablet Take 30 mg by mouth 2 (two) times daily.     metFORMIN (GLUCOPHAGE) 500 MG tablet Take 1 tablet (500 mg total) by mouth every evening. 90 tablet 3   nitroGLYCERIN (NITROSTAT) 0.4 MG SL tablet Place 1 tablet (0.4 mg total) under the tongue every 5 (five) minutes as needed for chest pain. 25 tablet 3   nystatin (MYCOSTATIN) 100000 UNIT/ML suspension Take 5 mLs (500,000 Units total) by mouth 4 (four) times daily. 120 mL 0   OneTouch Delica Lancets 33G MISC Use 1x a day 100 each 3   potassium chloride (KLOR-CON) 10 MEQ tablet Take 1 tablet (10 mEq total) by mouth daily. (Patient taking differently: Take 10 mEq by mouth daily. Takes 10 meq potassium every other day with 10 mg torsemide Takes 20 meq potassium every other day with 20 mg torsemide) 90 tablet 3   rosuvastatin (CRESTOR) 10 MG tablet Take 1 tablet (10 mg total) by mouth every other day. 45 tablet 2   Spacer/Aero Chamber Mouthpiece MISC Use with  inhaler as needed.  J44.9 1 each 0   torsemide (DEMADEX) 20 MG tablet Take 20 mg by mouth daily.     cyclobenzaprine (FLEXERIL) 10 MG tablet Take 0.5-1 tablets (5-10 mg total) by mouth 2 (two) times daily as needed for muscle spasms. (Patient not taking: Reported on 06/16/2022) 30 tablet 0   No current facility-administered medications for this visit.      PHYSICAL EXAMINATION:   Vitals:   06/18/22 1312  BP: 114/65  Pulse: 71  Resp: 20  Temp: (!) 96.1 F (35.6 C)  SpO2: 90%    Filed Weights     Physical Exam Vitals and nursing note reviewed.  HENT:     Head: Normocephalic and atraumatic.     Mouth/Throat:     Pharynx: Oropharynx is clear.  Eyes:     Extraocular Movements: Extraocular movements intact.     Pupils: Pupils are equal, round, and reactive to light.  Cardiovascular:     Rate and Rhythm: Normal rate and regular  rhythm.  Pulmonary:     Comments: Decreased breath sounds bilaterally.  Scattered wheezing. Abdominal:     Palpations: Abdomen is soft.  Musculoskeletal:        General: Normal range of motion.     Cervical back: Normal range of motion.  Skin:    General: Skin is warm.  Neurological:     General: No focal deficit present.     Mental Status: She is alert and oriented to person, place, and time.  Psychiatric:        Behavior: Behavior normal.        Judgment: Judgment normal.     LABORATORY DATA:  I have reviewed the data as listed Lab Results  Component Value Date   WBC 6.9 06/16/2022   HGB 7.2 (L) 06/16/2022   HCT 24.9 (L) 06/16/2022   MCV 102.0 (H) 06/16/2022   PLT 188 06/16/2022   Recent Labs    02/17/22 1356 02/23/22 1611 03/04/22 1537 03/10/22 1446 03/19/22 1507 05/03/22 1502 05/17/22 1410 06/16/22 1408  NA 141   < > 142 141   < > 138 139 139  K 3.9   < >  4.0 4.1   < > 3.9 3.9 4.0  CL 105   < > 103 96*   < > 90* 98 102  CO2 32   < > 32 36*   < > 35* 33* 30  GLUCOSE 102*   < > 113* 137*   < > 109* 129* 192*  BUN 26*   < > 25* 34*   < > 44* 30* 20  CREATININE 0.77   < > 1.05* 1.36*   < > 1.33* 1.08* 0.96  CALCIUM 8.8*   < > 8.8* 8.6*   < > 8.8* 8.6* 8.5*  GFRNONAA >60  --  52* 38*   < > 39* 51* 58*  PROT 6.5  --  6.9 7.2  --   --   --   --   ALBUMIN 3.5  --  3.6 3.7  --   --   --   --   AST 13*  --  17 17  --   --   --   --   ALT 11  --  15 12  --   --   --   --   ALKPHOS 85  --  82 84  --   --   --   --   BILITOT 0.6  --  0.4 0.4  --   --   --   --    < > = values in this interval not displayed.     No results found.  Symptomatic anemia  # Iron deficiency Anemia [OCT 2022- Ferritin-7]; Symptomatic -chronic intermittent [SINCE 2010]; s/p IV Venofer.   # Worsening anemia in spite of iron infusions- April 2024-iron saturation 10 % ferritin 26 -Discussed with the patient and daughter regarding multiple other causes of anemia including primary bone marrow  disorders.  Discussed regarding bone marrow biopsy for further evaluation; declines.  Proceed with IV Venofer today and weekly x 4.  # Also discussed re:  Discussed the reasoning for PRBC transfusion; and also also the potential risk of transfusion including but not limited to risk of infusion reactions; transmission of infections amongst others.  However the risk is extremely small; and the benefits of the infusion overweighs the risk.  Patient declines for now.  Consider PRBC transfusion next week.    # ETIOLOGY:  UNCLEAR- CT CT AP- JAN- 11th, 2023- Mild wall thickening of the proximal duodenum at least in part related to under distension, may reflect peptic ulcer disease/duodenitis.  Status post evaluation with Dr. Christella Hartigan in [facility] 2023]-as per patient patient is not a candidate for any upper and lower endoscopies. See above. Stable   # Bilateral Lower extremity swelling- on lasix-  ? Cause of lower GFR- defer to PCP.   # CKD-stage III secondary to torsemide.  # Bilateral knee pain [s/p Dr.Olen; ortho] -defer to Ortho.  # Panic attacks: defer to PCP.   I spoke at length with the patient's daughter over the phone- regarding the patient's clinical status/plan of care.  The daughter in agreement.   [wed pref] # DISPOSITION:  # Venofer today ## follow up with APP in next Thursday- cbc/bmp;hold tube; venofer - D-2 possible 1 unit of PRBC-  Dr.B  All questions were answered. The patient knows to call the clinic with any problems, questions or concerns.    Earna Coder, MD 06/18/2022 3:09 PM

## 2022-06-18 NOTE — Assessment & Plan Note (Addendum)
#   Iron deficiency Anemia [OCT 2022- Ferritin-7]; Symptomatic -chronic intermittent [SINCE 2010]; s/p IV Venofer.   # Worsening anemia in spite of iron infusions- April 2024-iron saturation 10 % ferritin 26 -Discussed with the patient and daughter regarding multiple other causes of anemia including primary bone marrow disorders.  Discussed regarding bone marrow biopsy for further evaluation; declines.  Proceed with IV Venofer today and weekly x 4.  # Also discussed re:  Discussed the reasoning for PRBC transfusion; and also also the potential risk of transfusion including but not limited to risk of infusion reactions; transmission of infections amongst others.  However the risk is extremely small; and the benefits of the infusion overweighs the risk.  Patient declines for now.  Consider PRBC transfusion next week.    # ETIOLOGY:  UNCLEAR- CT CT AP- JAN- 11th, 2023- Mild wall thickening of the proximal duodenum at least in part related to under distension, may reflect peptic ulcer disease/duodenitis.  Status post evaluation with Dr. Christella Hartigan in [facility] 2023]-as per patient patient is not a candidate for any upper and lower endoscopies. See above. Stable   # Bilateral Lower extremity swelling- on lasix-  ? Cause of lower GFR- defer to PCP.   # CKD-stage III secondary to torsemide.  # Bilateral knee pain [s/p Dr.Olen; ortho] -defer to Ortho.  # Panic attacks: defer to PCP.   I spoke at length with the patient's daughter over the phone- regarding the patient's clinical status/plan of care.  The daughter in agreement.   [wed pref] # DISPOSITION:  # Venofer today ## follow up with APP in next Thursday- cbc/bmp;hold tube; venofer - D-2 possible 1 unit of PRBC-  Dr.B

## 2022-06-18 NOTE — Patient Instructions (Signed)

## 2022-06-20 ENCOUNTER — Telehealth: Payer: Self-pay | Admitting: Family Medicine

## 2022-06-20 ENCOUNTER — Encounter: Payer: Self-pay | Admitting: Internal Medicine

## 2022-06-20 DIAGNOSIS — J9611 Chronic respiratory failure with hypoxia: Secondary | ICD-10-CM

## 2022-06-20 NOTE — Telephone Encounter (Signed)
I saw the note from outside clinic about patient having panic symptoms.  Please verify that she is still taking 2 of the fluoxetine 20 mg pills per day, total of 40 mg.  See if she is still taking BuSpar 15 mg twice a day.  See if she is taking any extra half doses of that for anxiety.  Please let me know.  Thanks.

## 2022-06-21 ENCOUNTER — Telehealth: Payer: Self-pay

## 2022-06-21 NOTE — Telephone Encounter (Signed)
Patient daughter called in stating that vascular and vein were suppose to be in contact with Dr Para March regarding sleeves that she is needing to be covered by insurance to help pull the fluid off of her legs. Daughter would like to know has vascular and vein been in communication with Dr Para March regarding this,because her mom needs these sleeves as soon as possible?

## 2022-06-21 NOTE — Telephone Encounter (Signed)
Pt's daughter called with questions about getting lymphedema pump. This has been given to rep and I have updated him with [name]/daughter's number. She has been made aware this has to go through insurance and can take some time, as pt just saw MD one week ago. [name] is very anxious to get this started. Will call back if she has further questions/concerns.

## 2022-06-21 NOTE — Telephone Encounter (Signed)
Did she talk to the patient about this?  Did the patient consent for hospice?  Is the patient willing to talk about this?  We can put in a referral for hospice (for evaluation) at any point for any patient if they are potentially eligible but we need to address the issues above first.

## 2022-06-21 NOTE — Telephone Encounter (Signed)
Daughter called in stating that she got the sleeve situation all worked out. However she thinks that her mom is at the end of her life,and would like to know what steps should she take to have hospice come in?

## 2022-06-22 ENCOUNTER — Encounter: Payer: Self-pay | Admitting: Internal Medicine

## 2022-06-22 MED FILL — Iron Sucrose Inj 20 MG/ML (Fe Equiv): INTRAVENOUS | Qty: 10 | Status: AC

## 2022-06-22 NOTE — Telephone Encounter (Signed)
Pt's daughter, [name], called is asking for status of Duncan's response. Told [name] Duncan's response, [name] stated she has spoke to the pt about Hospice & the pt does consent to Hospice. [name] stated if Para March wants to call & speak to them regarding hospice, the pt is willing to discuss it with him. [name] states she is in desperate need of help with this issue. Call back # [phone]

## 2022-06-22 NOTE — Telephone Encounter (Signed)
Patient's daughter [name] contacted the office following up on this issue. Informed her that her message has been sent to Dr. Para March and to give a little more time for a response. Would like a call back whenever possible, please advise [phone].

## 2022-06-22 NOTE — Telephone Encounter (Signed)
Please talk to me about this.  I didn't call her daughter back yet.    We have worked to meet this patient's needs within the guidelines for care (ie getting input from patient).  It is not appropriate for pt's daughter to yell at staff here.  Please talk to me about this.   She can schedule an OV with me or a video visit if needed.

## 2022-06-22 NOTE — Telephone Encounter (Signed)
LMTCB

## 2022-06-22 NOTE — Telephone Encounter (Signed)
Patients daughter [name] called and started questioning me about why I contacted her mother about the hospice care. She was very angry with me for doing so with out contacting her first to let her know we would be calling the patient about this. She stated that her mother is not doing well at all and she needed to be there when I called. I tried several times to explain to her that it is our policy to check with the patients to be sure they are okay with hospice care. I never did get to speak with her mom; I was only able to leave a message to call us back. She then went on to yell at me about why Dr. Para March did not call her to speak with her about her mom and what's going on and also demanded to know why a random hospice place was choosen. I explained to her that our referral coordinators handle where the referrals go unless we are told exactly where a referral needs to go. [name] stated that she wants a [facility] hosp[facility]; I asked her if she knew one and she could not give me a place only that she knew there were some. I googled this and could not find one. She asked that I send a message to the coordinators since they would know more then I do. She also demanded that Dr. Para March call her back to discuss the many issues going on with her mom and what we we did wrong in this situation. I advised her I will send a message out to both Dr. Para March and referrals to see what can be done. I am sending the message to Dr. Para March about the situation and referrals to see if referral can be changed to what the daughter is asking for.

## 2022-06-22 NOTE — Telephone Encounter (Signed)
Please notify pt that I put in the hospice referral.  Please verify her desire to proceed. Thanks.

## 2022-06-23 ENCOUNTER — Encounter: Payer: Self-pay | Admitting: Radiation Oncology

## 2022-06-23 ENCOUNTER — Other Ambulatory Visit: Payer: Self-pay

## 2022-06-23 ENCOUNTER — Ambulatory Visit: Payer: Commercial Managed Care - PPO

## 2022-06-23 ENCOUNTER — Other Ambulatory Visit: Payer: Self-pay | Admitting: *Deleted

## 2022-06-23 ENCOUNTER — Inpatient Hospital Stay: Payer: Medicare Other

## 2022-06-23 ENCOUNTER — Ambulatory Visit: Payer: Commercial Managed Care - PPO | Admitting: Internal Medicine

## 2022-06-23 ENCOUNTER — Encounter: Payer: Self-pay | Admitting: Internal Medicine

## 2022-06-23 ENCOUNTER — Other Ambulatory Visit: Payer: Commercial Managed Care - PPO

## 2022-06-23 ENCOUNTER — Inpatient Hospital Stay (HOSPITAL_BASED_OUTPATIENT_CLINIC_OR_DEPARTMENT_OTHER): Payer: Medicare Other | Admitting: Medical Oncology

## 2022-06-23 ENCOUNTER — Encounter: Payer: Self-pay | Admitting: Medical Oncology

## 2022-06-23 VITALS — BP 123/36 | HR 74 | Temp 99.2°F | Resp 22 | Ht 65.0 in | Wt 235.0 lb

## 2022-06-23 VITALS — BP 147/49 | HR 84

## 2022-06-23 DIAGNOSIS — D649 Anemia, unspecified: Secondary | ICD-10-CM

## 2022-06-23 DIAGNOSIS — D500 Iron deficiency anemia secondary to blood loss (chronic): Secondary | ICD-10-CM | POA: Diagnosis not present

## 2022-06-23 DIAGNOSIS — M25562 Pain in left knee: Secondary | ICD-10-CM | POA: Diagnosis not present

## 2022-06-23 DIAGNOSIS — M25561 Pain in right knee: Secondary | ICD-10-CM | POA: Diagnosis not present

## 2022-06-23 DIAGNOSIS — J439 Emphysema, unspecified: Secondary | ICD-10-CM

## 2022-06-23 DIAGNOSIS — E1122 Type 2 diabetes mellitus with diabetic chronic kidney disease: Secondary | ICD-10-CM | POA: Diagnosis not present

## 2022-06-23 DIAGNOSIS — J4489 Other specified chronic obstructive pulmonary disease: Secondary | ICD-10-CM

## 2022-06-23 DIAGNOSIS — F410 Panic disorder [episodic paroxysmal anxiety] without agoraphobia: Secondary | ICD-10-CM | POA: Diagnosis not present

## 2022-06-23 DIAGNOSIS — N183 Chronic kidney disease, stage 3 unspecified: Secondary | ICD-10-CM | POA: Diagnosis not present

## 2022-06-23 DIAGNOSIS — D509 Iron deficiency anemia, unspecified: Secondary | ICD-10-CM | POA: Diagnosis not present

## 2022-06-23 LAB — BPAM RBC
Blood Product Expiration Date: 202405012359
Unit Type and Rh: 9500
Unit Type and Rh: 9500

## 2022-06-23 LAB — CBC WITH DIFFERENTIAL/PLATELET
Abs Immature Granulocytes: 0.08 10*3/uL — ABNORMAL HIGH (ref 0.00–0.07)
Basophils Absolute: 0 10*3/uL (ref 0.0–0.1)
Basophils Relative: 0 %
Eosinophils Absolute: 0.1 10*3/uL (ref 0.0–0.5)
Eosinophils Relative: 1 %
HCT: 22.9 % — ABNORMAL LOW (ref 36.0–46.0)
Hemoglobin: 6.4 g/dL — CL (ref 12.0–15.0)
Immature Granulocytes: 1 %
Lymphocytes Relative: 12 %
Lymphs Abs: 1.3 10*3/uL (ref 0.7–4.0)
MCH: 29.8 pg (ref 26.0–34.0)
MCHC: 27.9 g/dL — ABNORMAL LOW (ref 30.0–36.0)
MCV: 106.5 fL — ABNORMAL HIGH (ref 80.0–100.0)
Monocytes Absolute: 0.7 10*3/uL (ref 0.1–1.0)
Monocytes Relative: 7 %
Neutro Abs: 8.1 10*3/uL — ABNORMAL HIGH (ref 1.7–7.7)
Neutrophils Relative %: 79 %
Platelets: 217 10*3/uL (ref 150–400)
RBC: 2.15 MIL/uL — ABNORMAL LOW (ref 3.87–5.11)
RDW: 17.2 % — ABNORMAL HIGH (ref 11.5–15.5)
WBC: 10.3 10*3/uL (ref 4.0–10.5)
nRBC: 0.5 % — ABNORMAL HIGH (ref 0.0–0.2)

## 2022-06-23 LAB — BASIC METABOLIC PANEL - CANCER CENTER ONLY
Anion gap: 9 (ref 5–15)
BUN: 32 mg/dL — ABNORMAL HIGH (ref 8–23)
CO2: 33 mmol/L — ABNORMAL HIGH (ref 22–32)
Calcium: 8.4 mg/dL — ABNORMAL LOW (ref 8.9–10.3)
Chloride: 98 mmol/L (ref 98–111)
Creatinine: 1.1 mg/dL — ABNORMAL HIGH (ref 0.44–1.00)
GFR, Estimated: 50 mL/min — ABNORMAL LOW (ref >60)
Glucose, Bld: 199 mg/dL — ABNORMAL HIGH (ref 70–99)
Potassium: 4.5 mmol/L (ref 3.5–5.1)
Sodium: 140 mmol/L (ref 135–145)

## 2022-06-23 LAB — TYPE AND SCREEN: Unit division: 0

## 2022-06-23 LAB — SAMPLE TO BLOOD BANK

## 2022-06-23 LAB — PREPARE RBC (CROSSMATCH)

## 2022-06-23 MED ORDER — METHYLPREDNISOLONE SODIUM SUCC 125 MG IJ SOLR
40.0000 mg | Freq: Once | INTRAMUSCULAR | Status: AC
  Administered 2022-06-23: 40 mg via INTRAVENOUS
  Filled 2022-06-23: qty 2

## 2022-06-23 MED ORDER — SODIUM CHLORIDE 0.9 % IV SOLN
200.0000 mg | Freq: Once | INTRAVENOUS | Status: AC
  Administered 2022-06-23: 200 mg via INTRAVENOUS
  Filled 2022-06-23: qty 200

## 2022-06-23 MED ORDER — SODIUM CHLORIDE 0.9 % IV SOLN
Freq: Once | INTRAVENOUS | Status: AC
  Filled 2022-06-23: qty 250

## 2022-06-23 NOTE — Patient Instructions (Signed)

## 2022-06-23 NOTE — Progress Notes (Signed)
[facility] CONSULT NOTE  Patient Care Team: Joaquim Nam, MD as PCP - General Mariah Milling, Tollie Pizza, MD as Consulting Physician (Cardiology) Oneta Rack, NP as Nurse Practitioner (Adult Health Nurse Practitioner) Earna Coder, MD as Consulting Physician (Oncology)  CHIEF COMPLAINTS/PURPOSE OF CONSULTATION: ANEMIA  HEMATOLOGY HISTORY:  # CHRONIC INTERMITTENT ANEMIA  [since 2010]- Hb SEP 2022- 8; previously on PO Iron- ; WBC/platelets- Normal; MCV- 90s. EGD- > 15 years; /Colonoscopy:4 years- [Dr.Jacob;; in [place]] capsule-? Bone marrow Biopsy-none; OCT 2022- SEVERE IRON DEFICIENCY; myeloma work-up/hemolysis work-up negative; JAN 2023-CT scan duodenal thickening-recommend GI evaluation  #   AUG 2021- RIGHT LUNG stage I non-small cell lung cancer-  s/p SBRT   [Dr.Moody; [place]]; NOV 2022- CT chest-no recurrent disease.  # COPD on 2.5 lit/day [Dr.Sood]; diastolic CHF/ OCT 2022-Low BP [needing to come off losartan- Dr.Gollan]; peripheral neuropathy.  HISTORY OF PRESENTING ILLNESS: Alone; 2.5 Lit/ O2 24 x7. In wheel chair.  patient's  friend in the room.   Olene Craven 87 y.o.  female with chronic respiratory failure on oxygen and severe anemia secondary to iron deficiency of unclear etiology vs others s/p venofer is here for follow-up. She is joined by her healthcare aid, her friend in person as well as her daughter via phone.   She is fatigued and often feels dizzy when she tried to stand on her own.  She discussed that she was seen by hospice earlier today as referred by her PCP for her COPD. She reports that she does not feel ready for hospice care at this time. She is scheduled for 1 unit PRBC for tomorrow but is a bit apprehensive as she has never had a blood transfusion. They want to learn more about the blood transfusion today.   Review of Systems  Constitutional:  Positive for malaise/fatigue. Negative for chills, diaphoresis, fever and weight loss.  HENT:   Negative for nosebleeds and sore throat.   Eyes:  Negative for double vision.  Respiratory:  Positive for cough and shortness of breath. Negative for hemoptysis, sputum production and wheezing.   Cardiovascular:  Positive for leg swelling. Negative for chest pain, palpitations and orthopnea.  Gastrointestinal:  Negative for abdominal pain, blood in stool, constipation, diarrhea, heartburn, melena, nausea and vomiting.  Genitourinary:  Negative for dysuria, frequency and urgency.  Musculoskeletal:  Negative for back pain and joint pain.  Skin: Negative.  Negative for itching and rash.  Neurological:  Positive for dizziness. Negative for tingling, focal weakness, weakness and headaches.  Endo/Heme/Allergies:  Does not bruise/bleed easily.  Psychiatric/Behavioral:  Negative for depression. The patient is not nervous/anxious and does not have insomnia.     MEDICAL HISTORY:  Past Medical History:  Diagnosis Date   Allergy, unspecified not elsewhere classified    Anxiety    Aortic stenosis, mild 03/30/2022   TTE 03/29/2022: EF 55-60, no RWMA, GR 1 DD, normal RVSF, mild aortic stenosis (mean 10, V-max 212 cm/s, DI 0.70)   Cataract    Chronic diastolic congestive heart failure    COPD (chronic obstructive pulmonary disease) 03/08/1998   PFTs 12/12/2002 FEV 1 1.42 (64%) ratio 58 with no better after B2 and DLCO75%; PFTs 11/20/09 FEV1 1.50 (73%) ratio 50 no better after B2 with DLCO 62%; Hfa 50% 11/20/2009 >75%, 01/13/10 p coaching   Depression 12/07/1998   Diabetes mellitus type II 02/05/2006   Dr. Elvera Lennox with endo   Disorders of bursae and tendons in shoulder region, unspecified    Rotator cuff  syndrome, right   E. coli bacteremia    Esophagitis    GERD (gastroesophageal reflux disease)    History of UTI    Hyperlipidemia 10/04/2000   Hypertension 03/08/1992   Hypothyroidism 03/08/1968   Iron deficiency anemia    Lung nodule    radiation starts 10-08-2019   Malignant neoplasm of right upper  lobe of lung 07/24/2019   Obesity    NOS   OSA (obstructive sleep apnea)    PSG 01/27/10 AHI 13, pt does not know CPAP settings   Peripheral neuropathy    Likely due to DM per Dr. Tresa Endo hernia     SURGICAL HISTORY: Past Surgical History:  Procedure Laterality Date   ABD U/S  03/19/1999   Nml x2 foci in liver   ADENOSINE MYOVIEW  06/02/2007   Nml   CARDIOLITE PERSANTINE  08/24/2000   Nml   CAROTID U/S  08/24/2000   1-39% ICA stenosis   CAROTID U/S  06/02/2007   No apprec change    CARPAL TUNNEL RELEASE  12/1997   Right   CESAREAN SECTION     x2 Breech/ repeat   CHOLECYSTECTOMY  1997   COLONOSCOPY WITH PROPOFOL N/A 02/12/2016   Procedure: COLONOSCOPY WITH PROPOFOL;  Surgeon: Rachael Fee, MD;  Location: [facility];  Service: Endoscopy;  Laterality: N/A;   CT ABD W & PELVIS WO/W CM     Abd hemangiomas of liver, 1 cm R renal cyst   DENTAL SURGERY  2016   Implants   DEXA  07/03/2003   Nml   ESOPHAGOGASTRODUODENOSCOPY  12/05/1997   Nml (due to hoarseness)   ESOPHAGOGASTRODUODENOSCOPY (EGD) WITH PROPOFOL N/A 02/12/2016   Procedure: ESOPHAGOGASTRODUODENOSCOPY (EGD) WITH PROPOFOL;  Surgeon: Rachael Fee, MD;  Location: [facility];  Service: Endoscopy;  Laterality: N/A;   GALLBLADDER SURGERY     HERNIA REPAIR  01/24/2009   Lap Ventr w/ Lysis of adhesions (Dr. Dwain Sarna)   knee arthroscopic surgery  years ago   right   ROTATOR CUFF REPAIR  1984   Right, [place]   SHOULDER OPEN ROTATOR CUFF REPAIR  02/08/2012   Procedure: ROTATOR CUFF REPAIR SHOULDER OPEN;  Surgeon: Drucilla Schmidt, MD;  Location: [facility];  Service: Orthopedics;  Laterality: Left;  Left Shoulder Open Anterior Acrominectomy Rotator Cuff Repair Open Distal Clavicle Resection ,tissue mend graft, and repair of biceps tendon   THUMB RELEASE  12/1997   Right   TONSILLECTOMY     TOTAL ABDOMINAL HYSTERECTOMY  1985   Due to dysmennorhea   US ECHOCARDIOGRAPHY  06/02/2007    SOCIAL  HISTORY: Social History   Socioeconomic History   Marital status: Widowed    Spouse name: Not on file   Number of children: 2   Years of education: Not on file   Highest education level: Not on file  Occupational History   Occupation: Retired    Associate Professor: RETIRED  Tobacco Use   Smoking status: Former    Packs/day: 2.00    Years: 50.00    Additional pack years: 0.00    Total pack years: 100.00    Types: Cigarettes    Quit date: 02/05/2005    Years since quitting: 17.3   Smokeless tobacco: Never  Vaping Use   Vaping Use: Never used  Substance and Sexual Activity   Alcohol use: Yes    Comment: occasionally   Drug use: Never   Sexual activity: Not Currently  Other Topics Concern   Not on file  Social History Narrative   Widowed 2023, 2 children; Enjoys painting      Quit smoking in 2007; no alcohol; lives in [place]; [name]/d lives in [place]. Had own business. Dont drive- sec to neuropathy.    Social Determinants of Health   Financial Resource Strain: Not on file  Food Insecurity: Not on file  Transportation Needs: Not on file  Physical Activity: Not on file  Stress: Not on file  Social Connections: Not on file  Intimate Partner Violence: Not on file    FAMILY HISTORY: Family History  Problem Relation Age of Onset   Heart disease Mother    Thyroid disease Mother    Emphysema Father        One lung   Cystic fibrosis Sister    Hyperthyroidism Sister    Osteoarthritis Brother    Hyperthyroidism Brother    Esophageal cancer Neg Hx    Cancer Neg Hx        Head or neck   Colon cancer Neg Hx    Stomach cancer Neg Hx    Breast cancer Neg Hx     ALLERGIES:  is allergic to antihistamines, diphenhydramine-type; sulfonamide derivatives; incruse ellipta [umeclidinium bromide]; clarithromycin; codeine; lyrica [pregabalin]; spiriva handihaler [tiotropium bromide monohydrate]; and vraylar [cariprazine].  MEDICATIONS:  Current Outpatient Medications  Medication Sig  Dispense Refill   albuterol (PROAIR HFA) 108 (90 Base) MCG/ACT inhaler Inhale 2 puffs into the lungs every 6 (six) hours as needed for wheezing or shortness of breath. INHALE 2 PUFFS INTO THE LUNGS EVERY 6 HOURS AS NEEDED FOR WHEEZING OR SHORTNESS OF BREATH 3 Inhaler 3   arformoterol (BROVANA) 15 MCG/2ML NEBU Take 2 mLs (15 mcg total) by nebulization at bedtime. 120 mL 6   budesonide (PULMICORT) 0.5 MG/2ML nebulizer solution Take 2 mLs (0.5 mg total) by nebulization in the morning and at bedtime. 360 mL 3   busPIRone (BUSPAR) 15 MG tablet Take 1 tablet (15 mg total) by mouth 2 (two) times daily. With extra 1/2 tab midday (Patient taking differently: Take 15 mg by mouth 3 (three) times daily. With extra 1/2 tab midday)     Cholecalciferol (VITAMIN D) 50 MCG (2000 UT) tablet Take 1 tablet (2,000 Units total) by mouth daily.     Coenzyme Q-10 200 MG CAPS Take 200 mg by mouth daily.     Cyanocobalamin (B-12) 5000 MCG CAPS Take 5,000 mcg by mouth daily.     esomeprazole (NEXIUM) 40 MG capsule Take 1 capsule (40 mg total) by mouth daily at 12 noon. 30 capsule 11   ezetimibe (ZETIA) 10 MG tablet TAKE 1 TABLET BY MOUTH EVERYDAY AT BEDTIME 90 tablet 2   ferrous sulfate 325 (65 FE) MG tablet TAKE 1 TABLET BY MOUTH EVERY DAY 90 tablet 3   fexofenadine (ALLEGRA) 180 MG tablet Take 180 mg by mouth as needed.     FLUoxetine (PROZAC) 20 MG tablet Take 20 mg by mouth 2 (two) times daily.     fluticasone (FLONASE) 50 MCG/ACT nasal spray USE 2 SPRAYS INTO THE NOSE EVERY 12 HOURS AS NEEDED FOR STUFFY NOSE 48 mL 2   glucose blood (ONETOUCH VERIO) test strip USE AS INSTRUCTED TO CHECK SUGAR 1 TIME DAILY 100 strip 3   guaiFENesin (MUCINEX) 600 MG 12 hr tablet Take 2 tablets (1,200 mg total) by mouth 2 (two) times daily as needed for cough or to loosen phlegm.     isosorbide mononitrate (IMDUR) 30 MG 24 hr tablet Take 1 tablet (30  mg total) by mouth daily. 90 tablet 3   levothyroxine (SYNTHROID) 125 MCG tablet Take 1  tablet (125 mcg total) by mouth daily. 90 tablet 3   lidocaine (LIDODERM) 5 % Place 1 patch onto the skin every 12 (twelve) hours. Remove & Discard patch within 12 hours or as directed by MD 10 patch 0   magnesium 30 MG tablet Take 30 mg by mouth 2 (two) times daily.     metFORMIN (GLUCOPHAGE) 500 MG tablet Take 1 tablet (500 mg total) by mouth every evening. 90 tablet 3   nystatin (MYCOSTATIN) 100000 UNIT/ML suspension Take 5 mLs (500,000 Units total) by mouth 4 (four) times daily. 120 mL 0   OneTouch Delica Lancets 33G MISC Use 1x a day 100 each 3   potassium chloride (KLOR-CON) 10 MEQ tablet Take 1 tablet (10 mEq total) by mouth daily. (Patient taking differently: Take 10 mEq by mouth daily. Takes 10 meq potassium every other day with 10 mg torsemide Takes 20 meq potassium every other day with 20 mg torsemide) 90 tablet 3   rosuvastatin (CRESTOR) 10 MG tablet Take 1 tablet (10 mg total) by mouth every other day. 45 tablet 2   Spacer/Aero Chamber Mouthpiece MISC Use with  inhaler as needed.  J44.9 1 each 0   torsemide (DEMADEX) 20 MG tablet Take 20 mg by mouth daily.     cyclobenzaprine (FLEXERIL) 10 MG tablet Take 0.5-1 tablets (5-10 mg total) by mouth 2 (two) times daily as needed for muscle spasms. (Patient not taking: Reported on 06/16/2022) 30 tablet 0   nitroGLYCERIN (NITROSTAT) 0.4 MG SL tablet Place 1 tablet (0.4 mg total) under the tongue every 5 (five) minutes as needed for chest pain. (Patient not taking: Reported on 06/23/2022) 25 tablet 3   No current facility-administered medications for this visit.   Facility-Administered Medications Ordered in Other Visits  Medication Dose Route Frequency Provider Last Rate Last Admin   iron sucrose (VENOFER) 200 mg in sodium chloride 0.9 % 100 mL IVPB  200 mg Intravenous Once Earna Coder, MD 440 mL/hr at 06/23/22 1439 200 mg at 06/23/22 1439   PHYSICAL EXAMINATION:   Vitals:   06/23/22 1303 06/23/22 1317  BP: (!) 71/44 (!) 123/36   Pulse: 74 74  Resp: (!) 24 (!) 22  Temp: 99.2 F (37.3 C)   SpO2: (!) 88% 98%    Filed Weights   06/23/22 1303  Weight: 235 lb (106.6 kg)     Physical Exam Vitals and nursing note reviewed.  HENT:     Head: Normocephalic and atraumatic.     Mouth/Throat:     Pharynx: Oropharynx is clear.  Eyes:     Extraocular Movements: Extraocular movements intact.     Pupils: Pupils are equal, round, and reactive to light.  Cardiovascular:     Rate and Rhythm: Normal rate and regular rhythm.  Pulmonary:     Comments: Decreased breath sounds bilaterally.  Scattered wheezing. Abdominal:     Palpations: Abdomen is soft.  Musculoskeletal:        General: Normal range of motion.     Cervical back: Normal range of motion.  Skin:    General: Skin is warm.  Neurological:     General: No focal deficit present.     Mental Status: She is alert and oriented to person, place, and time.  Psychiatric:        Behavior: Behavior normal.        Judgment: Judgment normal.  LABORATORY DATA:  I have reviewed the data as listed Lab Results  Component Value Date   WBC 10.3 06/23/2022   HGB 6.4 (LL) 06/23/2022   HCT 22.9 (L) 06/23/2022   MCV 106.5 (H) 06/23/2022   PLT 217 06/23/2022   Recent Labs    02/17/22 1356 02/23/22 1611 03/04/22 1537 03/10/22 1446 03/19/22 1507 05/17/22 1410 06/16/22 1408 06/23/22 1235  NA 141   < > 142 141   < > 139 139 140  K 3.9   < > 4.0 4.1   < > 3.9 4.0 4.5  CL 105   < > 103 96*   < > 98 102 98  CO2 32   < > 32 36*   < > 33* 30 33*  GLUCOSE 102*   < > 113* 137*   < > 129* 192* 199*  BUN 26*   < > 25* 34*   < > 30* 20 32*  CREATININE 0.77   < > 1.05* 1.36*   < > 1.08* 0.96 1.10*  CALCIUM 8.8*   < > 8.8* 8.6*   < > 8.6* 8.5* 8.4*  GFRNONAA >60  --  52* 38*   < > 51* 58* 50*  PROT 6.5  --  6.9 7.2  --   --   --   --   ALBUMIN 3.5  --  3.6 3.7  --   --   --   --   AST 13*  --  17 17  --   --   --   --   ALT 11  --  15 12  --   --   --   --   ALKPHOS  85  --  82 84  --   --   --   --   BILITOT 0.6  --  0.4 0.4  --   --   --   --    < > = values in this interval not displayed.   Encounter Diagnoses  Name Primary?   Symptomatic anemia Yes   Iron deficiency anemia due to chronic blood loss    COPD with chronic bronchitis and emphysema    Anemia: Chronic but worsening fairly quickly over the past few months. Per Dr. Donneta Romberg likely secondary to potential bone marrow disorders. No evidence of acute blood loss. ESA not recommended given history of non-small cell lung cancer- shown to negatively impact overall survival. She has declined bone marrow biopsy, endo/colonoscopy. She is currently receiving IV iron. We had a long discussion about the utility of blood transfusion along with risks. We discussed how they are performed and like likelihood that she will need ongoing transfusions. At this time she is agreeable and I have signed orders for 2 units of PRBC for tomorrow. Alternative would be the ER however they decline.   IDA: Continue iron infusions as agreeable by patient  COPD: Chronic. End stage. Continue follow up with PCP/specialist.   Disposition: Venofer todday RTC tomorrow for 2 units of PRBC RTC next Thursday with me plus labs D1 (CBC w/, iron, ferritin, CMP, hold tube) D2 possible 1 unit PRBC-[place]   She will need follow up chronic care with Dr. Donneta Romberg scheduled after our visit next week   All questions were answered. The patient knows to call the clinic with any problems, questions or concerns.    Rushie Chestnut, PA-C 06/23/2022 2:46 PM

## 2022-06-23 NOTE — Telephone Encounter (Signed)
See below and please contact patient's daughter.  I am not aware of any catholic hospice agencies in the area.  Staff has not been able to locate one either.  Thanks.

## 2022-06-23 NOTE — Telephone Encounter (Signed)
Per oncology note from today "She discussed that she was seen by hospice earlier today as referred by her PCP for her COPD. She reports that she does not feel ready for hospice care at this time."  She needs an office visit to discuss her goals of care.  Please talk to me about this patient.  Thanks.

## 2022-06-23 NOTE — Progress Notes (Signed)
Recvd Critical lab from [employer] Hgb 6.4. Read back. Clent Jacks notified

## 2022-06-24 ENCOUNTER — Ambulatory Visit (HOSPITAL_COMMUNITY): Payer: Medicare Other

## 2022-06-24 ENCOUNTER — Inpatient Hospital Stay: Payer: Medicare Other

## 2022-06-24 ENCOUNTER — Telehealth: Payer: Self-pay | Admitting: Radiation Oncology

## 2022-06-24 ENCOUNTER — Other Ambulatory Visit: Payer: Self-pay | Admitting: Cardiology

## 2022-06-24 DIAGNOSIS — D509 Iron deficiency anemia, unspecified: Secondary | ICD-10-CM | POA: Diagnosis not present

## 2022-06-24 DIAGNOSIS — M25562 Pain in left knee: Secondary | ICD-10-CM | POA: Diagnosis not present

## 2022-06-24 DIAGNOSIS — M25561 Pain in right knee: Secondary | ICD-10-CM | POA: Diagnosis not present

## 2022-06-24 DIAGNOSIS — F410 Panic disorder [episodic paroxysmal anxiety] without agoraphobia: Secondary | ICD-10-CM | POA: Diagnosis not present

## 2022-06-24 DIAGNOSIS — D649 Anemia, unspecified: Secondary | ICD-10-CM

## 2022-06-24 DIAGNOSIS — E1122 Type 2 diabetes mellitus with diabetic chronic kidney disease: Secondary | ICD-10-CM | POA: Diagnosis not present

## 2022-06-24 DIAGNOSIS — N183 Chronic kidney disease, stage 3 unspecified: Secondary | ICD-10-CM | POA: Diagnosis not present

## 2022-06-24 LAB — TYPE AND SCREEN
ABO/RH(D): O NEG
Antibody Screen: POSITIVE
Unit division: 0

## 2022-06-24 LAB — BPAM RBC

## 2022-06-24 MED ORDER — SODIUM CHLORIDE 0.9% IV SOLUTION
250.0000 mL | Freq: Once | INTRAVENOUS | Status: AC
  Administered 2022-06-24: 250 mL via INTRAVENOUS
  Filled 2022-06-24: qty 250

## 2022-06-24 MED ORDER — ACETAMINOPHEN 325 MG PO TABS
650.0000 mg | ORAL_TABLET | Freq: Once | ORAL | Status: AC
  Administered 2022-06-24: 650 mg via ORAL
  Filled 2022-06-24: qty 2

## 2022-06-24 NOTE — Patient Instructions (Signed)

## 2022-06-24 NOTE — Telephone Encounter (Signed)
CALLED PATIENT'S DAUGHTER- [name] LEBOLD TO INFORM OF CT FOR HER MOM ON 07-14-22- ARRIVAL TIME- 2:30 PM @ [facility], PATIENT TO HAVE WATER ONLY 4 HRS. PRIOR TO TEST, PATIENT'S DAUGHTER- [name] LEBOLD TO RECEIVE PHONE CALL FROM ALISON PERKINS ON 06-28-22 @ 2 PM FOR RESULTS, LVM FOR RETURN CALL

## 2022-06-25 LAB — BPAM RBC
Blood Product Expiration Date: 202405012359
ISSUE DATE / TIME: 202404180909
ISSUE DATE / TIME: 202404181108
Unit Type and Rh: 9500

## 2022-06-25 LAB — TYPE AND SCREEN
Donor AG Type: NEGATIVE
Donor AG Type: NEGATIVE

## 2022-06-25 NOTE — Telephone Encounter (Signed)
  Received: Alexandria [name], Alexandria [name]  Joaquim Nam, MD; Valentino Nose, RN Caller: Unspecified (4 days ago,  9:36 AM) Contacted patient regarding making an appointment to discuss care goals. [name] asked me to call Alexandria [name] to set that up.  Spoke to Alexandria [name]-pt's daughter also on [place]. Advised patient that Dr. Para March would like an appointment made with both she and her mother to set up care plan goals. Appointment was made for Monday 4.29.24.  Alexandria [name] also stated she had POA documents that she will bring to the appointment since they are not on file in her chart.  Alexandria [name] found out that hospice was not able to take her mom because they would not be able to continue with her infusions.  So she understands that is not an option.  Cephas Darby that while we do understand the importance of getting appropriate care for her mother that for any care changes we would need to communicate with her mom. In addition she was reminded that it is not okay to yell or speak inappropriately to team members who were doing their job.  Alexandria [name]'s concern is that her mom is asleep most of the time and not always coherent. Alexandria [name]'s number is primary at this time as the first contact as she handles making appointments for her mom.  Cephas Darby that if anything does not work for this appointment, or if there are any other concerns to reach out to our office.        ============== Noted. Thanks.

## 2022-06-27 ENCOUNTER — Encounter: Payer: Self-pay | Admitting: Nurse Practitioner

## 2022-06-28 ENCOUNTER — Telehealth: Payer: Self-pay | Admitting: Nurse Practitioner

## 2022-06-28 ENCOUNTER — Other Ambulatory Visit: Payer: Self-pay | Admitting: Radiation Oncology

## 2022-06-28 ENCOUNTER — Ambulatory Visit: Payer: Medicare Other | Admitting: Radiation Oncology

## 2022-06-28 DIAGNOSIS — C3411 Malignant neoplasm of upper lobe, right bronchus or lung: Secondary | ICD-10-CM

## 2022-06-28 NOTE — Telephone Encounter (Signed)
Patient's daughter called and would like to see if patient can have an iron infusion after her labs/Lauren Freida Busman appointment on 4/25. FYI she is also scheduled for possible blood the following morning on 4/26.

## 2022-06-29 ENCOUNTER — Inpatient Hospital Stay (HOSPITAL_BASED_OUTPATIENT_CLINIC_OR_DEPARTMENT_OTHER): Payer: Medicare Other | Admitting: Nurse Practitioner

## 2022-06-29 ENCOUNTER — Other Ambulatory Visit: Payer: Self-pay

## 2022-06-29 ENCOUNTER — Emergency Department: Payer: Medicare Other

## 2022-06-29 ENCOUNTER — Inpatient Hospital Stay
Admission: EM | Admit: 2022-06-29 | Discharge: 2022-07-07 | DRG: 189 | Disposition: A | Discharge status: Skilled Nursing Facility | Payer: Medicare Other | Attending: Student | Admitting: Student

## 2022-06-29 ENCOUNTER — Inpatient Hospital Stay: Payer: Medicare Other

## 2022-06-29 ENCOUNTER — Encounter: Payer: Self-pay | Admitting: Nurse Practitioner

## 2022-06-29 VITALS — BP 139/60 | HR 85 | Temp 98.1°F | Wt 230.7 lb

## 2022-06-29 DIAGNOSIS — J441 Chronic obstructive pulmonary disease with (acute) exacerbation: Secondary | ICD-10-CM

## 2022-06-29 DIAGNOSIS — E785 Hyperlipidemia, unspecified: Secondary | ICD-10-CM | POA: Diagnosis not present

## 2022-06-29 DIAGNOSIS — Z9109 Other allergy status, other than to drugs and biological substances: Secondary | ICD-10-CM

## 2022-06-29 DIAGNOSIS — E1122 Type 2 diabetes mellitus with diabetic chronic kidney disease: Secondary | ICD-10-CM | POA: Diagnosis not present

## 2022-06-29 DIAGNOSIS — E538 Deficiency of other specified B group vitamins: Secondary | ICD-10-CM | POA: Diagnosis not present

## 2022-06-29 DIAGNOSIS — D649 Anemia, unspecified: Secondary | ICD-10-CM | POA: Diagnosis present

## 2022-06-29 DIAGNOSIS — I480 Paroxysmal atrial fibrillation: Secondary | ICD-10-CM

## 2022-06-29 DIAGNOSIS — R7881 Bacteremia: Secondary | ICD-10-CM | POA: Diagnosis not present

## 2022-06-29 DIAGNOSIS — M25562 Pain in left knee: Secondary | ICD-10-CM | POA: Diagnosis not present

## 2022-06-29 DIAGNOSIS — E662 Morbid (severe) obesity with alveolar hypoventilation: Secondary | ICD-10-CM | POA: Diagnosis present

## 2022-06-29 DIAGNOSIS — I2510 Atherosclerotic heart disease of native coronary artery without angina pectoris: Secondary | ICD-10-CM | POA: Diagnosis present

## 2022-06-29 DIAGNOSIS — R440 Auditory hallucinations: Secondary | ICD-10-CM | POA: Diagnosis not present

## 2022-06-29 DIAGNOSIS — I350 Nonrheumatic aortic (valve) stenosis: Secondary | ICD-10-CM | POA: Diagnosis not present

## 2022-06-29 DIAGNOSIS — R5381 Other malaise: Secondary | ICD-10-CM | POA: Diagnosis present

## 2022-06-29 DIAGNOSIS — E1165 Type 2 diabetes mellitus with hyperglycemia: Secondary | ICD-10-CM | POA: Diagnosis not present

## 2022-06-29 DIAGNOSIS — J9622 Acute and chronic respiratory failure with hypercapnia: Secondary | ICD-10-CM | POA: Diagnosis not present

## 2022-06-29 DIAGNOSIS — G4733 Obstructive sleep apnea (adult) (pediatric): Secondary | ICD-10-CM | POA: Diagnosis present

## 2022-06-29 DIAGNOSIS — I130 Hypertensive heart and chronic kidney disease with heart failure and stage 1 through stage 4 chronic kidney disease, or unspecified chronic kidney disease: Secondary | ICD-10-CM | POA: Diagnosis present

## 2022-06-29 DIAGNOSIS — Z90710 Acquired absence of both cervix and uterus: Secondary | ICD-10-CM

## 2022-06-29 DIAGNOSIS — D631 Anemia in chronic kidney disease: Secondary | ICD-10-CM | POA: Diagnosis not present

## 2022-06-29 DIAGNOSIS — N1832 Chronic kidney disease, stage 3b: Secondary | ICD-10-CM | POA: Diagnosis present

## 2022-06-29 DIAGNOSIS — D62 Acute posthemorrhagic anemia: Secondary | ICD-10-CM | POA: Diagnosis present

## 2022-06-29 DIAGNOSIS — R4182 Altered mental status, unspecified: Secondary | ICD-10-CM | POA: Diagnosis not present

## 2022-06-29 DIAGNOSIS — J9621 Acute and chronic respiratory failure with hypoxia: Principal | ICD-10-CM | POA: Diagnosis present

## 2022-06-29 DIAGNOSIS — N390 Urinary tract infection, site not specified: Secondary | ICD-10-CM | POA: Diagnosis not present

## 2022-06-29 DIAGNOSIS — Z6838 Body mass index (BMI) 38.0-38.9, adult: Secondary | ICD-10-CM

## 2022-06-29 DIAGNOSIS — R195 Other fecal abnormalities: Secondary | ICD-10-CM | POA: Diagnosis present

## 2022-06-29 DIAGNOSIS — I5032 Chronic diastolic (congestive) heart failure: Secondary | ICD-10-CM | POA: Diagnosis not present

## 2022-06-29 DIAGNOSIS — D500 Iron deficiency anemia secondary to blood loss (chronic): Secondary | ICD-10-CM

## 2022-06-29 DIAGNOSIS — D72829 Elevated white blood cell count, unspecified: Secondary | ICD-10-CM

## 2022-06-29 DIAGNOSIS — J9600 Acute respiratory failure, unspecified whether with hypoxia or hypercapnia: Secondary | ICD-10-CM | POA: Diagnosis not present

## 2022-06-29 DIAGNOSIS — R001 Bradycardia, unspecified: Secondary | ICD-10-CM | POA: Diagnosis present

## 2022-06-29 DIAGNOSIS — D509 Iron deficiency anemia, unspecified: Secondary | ICD-10-CM | POA: Diagnosis not present

## 2022-06-29 DIAGNOSIS — G9341 Metabolic encephalopathy: Secondary | ICD-10-CM | POA: Diagnosis not present

## 2022-06-29 DIAGNOSIS — E039 Hypothyroidism, unspecified: Secondary | ICD-10-CM | POA: Diagnosis not present

## 2022-06-29 DIAGNOSIS — Z8249 Family history of ischemic heart disease and other diseases of the circulatory system: Secondary | ICD-10-CM

## 2022-06-29 DIAGNOSIS — R0600 Dyspnea, unspecified: Secondary | ICD-10-CM | POA: Diagnosis not present

## 2022-06-29 DIAGNOSIS — I10 Essential (primary) hypertension: Secondary | ICD-10-CM | POA: Diagnosis present

## 2022-06-29 DIAGNOSIS — F410 Panic disorder [episodic paroxysmal anxiety] without agoraphobia: Secondary | ICD-10-CM | POA: Diagnosis not present

## 2022-06-29 DIAGNOSIS — Z881 Allergy status to other antibiotic agents status: Secondary | ICD-10-CM

## 2022-06-29 DIAGNOSIS — Z1152 Encounter for screening for COVID-19: Secondary | ICD-10-CM | POA: Diagnosis not present

## 2022-06-29 DIAGNOSIS — Z9981 Dependence on supplemental oxygen: Secondary | ICD-10-CM | POA: Diagnosis not present

## 2022-06-29 DIAGNOSIS — G903 Multi-system degeneration of the autonomic nervous system: Secondary | ICD-10-CM | POA: Diagnosis not present

## 2022-06-29 DIAGNOSIS — Z8349 Family history of other endocrine, nutritional and metabolic diseases: Secondary | ICD-10-CM

## 2022-06-29 DIAGNOSIS — F32A Depression, unspecified: Secondary | ICD-10-CM | POA: Diagnosis present

## 2022-06-29 DIAGNOSIS — R682 Dry mouth, unspecified: Secondary | ICD-10-CM | POA: Diagnosis not present

## 2022-06-29 DIAGNOSIS — Z85118 Personal history of other malignant neoplasm of bronchus and lung: Secondary | ICD-10-CM

## 2022-06-29 DIAGNOSIS — I25119 Atherosclerotic heart disease of native coronary artery with unspecified angina pectoris: Secondary | ICD-10-CM | POA: Diagnosis not present

## 2022-06-29 DIAGNOSIS — Z9049 Acquired absence of other specified parts of digestive tract: Secondary | ICD-10-CM

## 2022-06-29 DIAGNOSIS — I5033 Acute on chronic diastolic (congestive) heart failure: Secondary | ICD-10-CM | POA: Diagnosis not present

## 2022-06-29 DIAGNOSIS — Z7984 Long term (current) use of oral hypoglycemic drugs: Secondary | ICD-10-CM | POA: Diagnosis not present

## 2022-06-29 DIAGNOSIS — R441 Visual hallucinations: Secondary | ICD-10-CM | POA: Diagnosis not present

## 2022-06-29 DIAGNOSIS — Z7951 Long term (current) use of inhaled steroids: Secondary | ICD-10-CM

## 2022-06-29 DIAGNOSIS — M25561 Pain in right knee: Secondary | ICD-10-CM | POA: Diagnosis not present

## 2022-06-29 DIAGNOSIS — A419 Sepsis, unspecified organism: Secondary | ICD-10-CM | POA: Diagnosis not present

## 2022-06-29 DIAGNOSIS — Z79899 Other long term (current) drug therapy: Secondary | ICD-10-CM

## 2022-06-29 DIAGNOSIS — I959 Hypotension, unspecified: Secondary | ICD-10-CM | POA: Diagnosis not present

## 2022-06-29 DIAGNOSIS — N3000 Acute cystitis without hematuria: Secondary | ICD-10-CM | POA: Diagnosis not present

## 2022-06-29 DIAGNOSIS — E1140 Type 2 diabetes mellitus with diabetic neuropathy, unspecified: Secondary | ICD-10-CM | POA: Diagnosis present

## 2022-06-29 DIAGNOSIS — I4891 Unspecified atrial fibrillation: Secondary | ICD-10-CM | POA: Diagnosis not present

## 2022-06-29 DIAGNOSIS — Z882 Allergy status to sulfonamides status: Secondary | ICD-10-CM

## 2022-06-29 DIAGNOSIS — I493 Ventricular premature depolarization: Secondary | ICD-10-CM | POA: Diagnosis present

## 2022-06-29 DIAGNOSIS — Z825 Family history of asthma and other chronic lower respiratory diseases: Secondary | ICD-10-CM

## 2022-06-29 DIAGNOSIS — Z79890 Hormone replacement therapy (postmenopausal): Secondary | ICD-10-CM

## 2022-06-29 DIAGNOSIS — J90 Pleural effusion, not elsewhere classified: Secondary | ICD-10-CM | POA: Diagnosis not present

## 2022-06-29 DIAGNOSIS — Z885 Allergy status to narcotic agent status: Secondary | ICD-10-CM

## 2022-06-29 DIAGNOSIS — I110 Hypertensive heart disease with heart failure: Secondary | ICD-10-CM | POA: Diagnosis not present

## 2022-06-29 DIAGNOSIS — K219 Gastro-esophageal reflux disease without esophagitis: Secondary | ICD-10-CM | POA: Diagnosis present

## 2022-06-29 DIAGNOSIS — B9620 Unspecified Escherichia coli [E. coli] as the cause of diseases classified elsewhere: Secondary | ICD-10-CM | POA: Diagnosis present

## 2022-06-29 DIAGNOSIS — Z87891 Personal history of nicotine dependence: Secondary | ICD-10-CM

## 2022-06-29 DIAGNOSIS — J439 Emphysema, unspecified: Secondary | ICD-10-CM | POA: Diagnosis not present

## 2022-06-29 DIAGNOSIS — F419 Anxiety disorder, unspecified: Secondary | ICD-10-CM | POA: Diagnosis present

## 2022-06-29 DIAGNOSIS — N183 Chronic kidney disease, stage 3 unspecified: Secondary | ICD-10-CM | POA: Diagnosis not present

## 2022-06-29 DIAGNOSIS — Z7401 Bed confinement status: Secondary | ICD-10-CM | POA: Diagnosis not present

## 2022-06-29 DIAGNOSIS — G9340 Encephalopathy, unspecified: Secondary | ICD-10-CM | POA: Diagnosis not present

## 2022-06-29 HISTORY — DX: Atherosclerotic heart disease of native coronary artery without angina pectoris: I25.10

## 2022-06-29 LAB — BLOOD GAS, VENOUS
Acid-Base Excess: 11 mmol/L — ABNORMAL HIGH (ref 0.0–2.0)
Acid-Base Excess: 8.7 mmol/L — ABNORMAL HIGH (ref 0.0–2.0)
Bicarbonate: 39.5 mmol/L — ABNORMAL HIGH (ref 20.0–28.0)
Bicarbonate: 36.1 mmol/L — ABNORMAL HIGH (ref 20.0–28.0)
Delivery systems: POSITIVE
FIO2: 40 %
O2 Saturation: 84.9 %
O2 Saturation: 99.8 %
Patient temperature: 37
Patient temperature: 37
pCO2, Ven: 70 mmHg — ABNORMAL HIGH (ref 44–60)
pCO2, Ven: 61 mmHg — ABNORMAL HIGH (ref 44–60)
pH, Ven: 7.36 (ref 7.25–7.43)
pH, Ven: 7.38 (ref 7.25–7.43)
pO2, Ven: 53 mmHg — ABNORMAL HIGH (ref 32–45)
pO2, Ven: 158 mmHg — ABNORMAL HIGH (ref 32–45)

## 2022-06-29 LAB — COMPREHENSIVE METABOLIC PANEL
ALT: 15 U/L (ref 0–44)
ALT: 16 U/L (ref 0–44)
AST: 16 U/L (ref 15–41)
AST: 16 U/L (ref 15–41)
Albumin: 2.9 g/dL — ABNORMAL LOW (ref 3.5–5.0)
Albumin: 2.9 g/dL — ABNORMAL LOW (ref 3.5–5.0)
Alkaline Phosphatase: 104 U/L (ref 38–126)
Alkaline Phosphatase: 100 U/L (ref 38–126)
Anion gap: 7 (ref 5–15)
Anion gap: 3 — ABNORMAL LOW (ref 5–15)
BUN: 33 mg/dL — ABNORMAL HIGH (ref 8–23)
BUN: 33 mg/dL — ABNORMAL HIGH (ref 8–23)
CO2: 33 mmol/L — ABNORMAL HIGH (ref 22–32)
CO2: 38 mmol/L — ABNORMAL HIGH (ref 22–32)
Calcium: 7.8 mg/dL — ABNORMAL LOW (ref 8.9–10.3)
Calcium: 7.8 mg/dL — ABNORMAL LOW (ref 8.9–10.3)
Chloride: 94 mmol/L — ABNORMAL LOW (ref 98–111)
Chloride: 95 mmol/L — ABNORMAL LOW (ref 98–111)
Creatinine, Ser: 1.38 mg/dL — ABNORMAL HIGH (ref 0.44–1.00)
Creatinine, Ser: 1.36 mg/dL — ABNORMAL HIGH (ref 0.44–1.00)
GFR, Estimated: 38 mL/min — ABNORMAL LOW (ref >60)
GFR, Estimated: 38 mL/min — ABNORMAL LOW (ref >60)
Glucose, Bld: 174 mg/dL — ABNORMAL HIGH (ref 70–99)
Glucose, Bld: 130 mg/dL — ABNORMAL HIGH (ref 70–99)
Potassium: 4.6 mmol/L (ref 3.5–5.1)
Potassium: 4.6 mmol/L (ref 3.5–5.1)
Sodium: 134 mmol/L — ABNORMAL LOW (ref 135–145)
Sodium: 136 mmol/L (ref 135–145)
Total Bilirubin: 0.5 mg/dL (ref 0.3–1.2)
Total Bilirubin: 0.8 mg/dL (ref 0.3–1.2)
Total Protein: 6.5 g/dL (ref 6.5–8.1)
Total Protein: 6.3 g/dL — ABNORMAL LOW (ref 6.5–8.1)

## 2022-06-29 LAB — CBG MONITORING, ED: Glucose-Capillary: 119 mg/dL — ABNORMAL HIGH (ref 70–99)

## 2022-06-29 LAB — CBC WITH DIFFERENTIAL/PLATELET
Abs Immature Granulocytes: 0.08 10*3/uL — ABNORMAL HIGH (ref 0.00–0.07)
Abs Immature Granulocytes: 0.1 10*3/uL — ABNORMAL HIGH (ref 0.00–0.07)
Basophils Absolute: 0 10*3/uL (ref 0.0–0.1)
Basophils Absolute: 0 10*3/uL (ref 0.0–0.1)
Basophils Relative: 0 %
Basophils Relative: 0 %
Eosinophils Absolute: 0 10*3/uL (ref 0.0–0.5)
Eosinophils Absolute: 0 10*3/uL (ref 0.0–0.5)
Eosinophils Relative: 0 %
Eosinophils Relative: 0 %
HCT: 26.7 % — ABNORMAL LOW (ref 36.0–46.0)
HCT: 28.4 % — ABNORMAL LOW (ref 36.0–46.0)
Hemoglobin: 7.7 g/dL — ABNORMAL LOW (ref 12.0–15.0)
Hemoglobin: 8.1 g/dL — ABNORMAL LOW (ref 12.0–15.0)
Immature Granulocytes: 1 %
Immature Granulocytes: 1 %
Lymphocytes Relative: 2 %
Lymphocytes Relative: 3 %
Lymphs Abs: 0.3 10*3/uL — ABNORMAL LOW (ref 0.7–4.0)
Lymphs Abs: 0.3 10*3/uL — ABNORMAL LOW (ref 0.7–4.0)
MCH: 28.9 pg (ref 26.0–34.0)
MCH: 28.8 pg (ref 26.0–34.0)
MCHC: 28.8 g/dL — ABNORMAL LOW (ref 30.0–36.0)
MCHC: 28.5 g/dL — ABNORMAL LOW (ref 30.0–36.0)
MCV: 100.4 fL — ABNORMAL HIGH (ref 80.0–100.0)
MCV: 101.1 fL — ABNORMAL HIGH (ref 80.0–100.0)
Monocytes Absolute: 0.8 10*3/uL (ref 0.1–1.0)
Monocytes Absolute: 0.9 10*3/uL (ref 0.1–1.0)
Monocytes Relative: 7 %
Monocytes Relative: 7 %
Neutro Abs: 11.3 10*3/uL — ABNORMAL HIGH (ref 1.7–7.7)
Neutro Abs: 11 10*3/uL — ABNORMAL HIGH (ref 1.7–7.7)
Neutrophils Relative %: 90 %
Neutrophils Relative %: 89 %
Platelets: 125 10*3/uL — ABNORMAL LOW (ref 150–400)
Platelets: 136 10*3/uL — ABNORMAL LOW (ref 150–400)
RBC: 2.66 MIL/uL — ABNORMAL LOW (ref 3.87–5.11)
RBC: 2.81 MIL/uL — ABNORMAL LOW (ref 3.87–5.11)
RDW: 18.1 % — ABNORMAL HIGH (ref 11.5–15.5)
RDW: 18.1 % — ABNORMAL HIGH (ref 11.5–15.5)
WBC: 12.5 10*3/uL — ABNORMAL HIGH (ref 4.0–10.5)
WBC: 12.3 10*3/uL — ABNORMAL HIGH (ref 4.0–10.5)
nRBC: 0 % (ref 0.0–0.2)
nRBC: 0 % (ref 0.0–0.2)

## 2022-06-29 LAB — IRON AND TIBC
Iron: 11 ug/dL — ABNORMAL LOW (ref 28–170)
Saturation Ratios: 4 % — ABNORMAL LOW (ref 10.4–31.8)
TIBC: 266 ug/dL (ref 250–450)
UIBC: 255 ug/dL

## 2022-06-29 LAB — BRAIN NATRIURETIC PEPTIDE: B Natriuretic Peptide: 245.1 pg/mL — ABNORMAL HIGH (ref 0.0–100.0)

## 2022-06-29 LAB — SAMPLE TO BLOOD BANK

## 2022-06-29 LAB — URINALYSIS, W/ REFLEX TO CULTURE (INFECTION SUSPECTED)
Bilirubin Urine: NEGATIVE
Glucose, UA: NEGATIVE mg/dL
Ketones, ur: NEGATIVE mg/dL
Nitrite: NEGATIVE
Protein, ur: 30 mg/dL — AB
Specific Gravity, Urine: 1.005 (ref 1.005–1.030)
WBC, UA: >50 WBC/hpf (ref 0–5)
pH: 6 (ref 5.0–8.0)

## 2022-06-29 LAB — TROPONIN I (HIGH SENSITIVITY): Troponin I (High Sensitivity): 17 ng/L (ref <18)

## 2022-06-29 LAB — FERRITIN: Ferritin: 206 ng/mL (ref 11–307)

## 2022-06-29 LAB — SARS CORONAVIRUS 2 BY RT PCR: SARS Coronavirus 2 by RT PCR: NEGATIVE

## 2022-06-29 LAB — PROCALCITONIN: Procalcitonin: 10.41 ng/mL

## 2022-06-29 LAB — LACTIC ACID, PLASMA: Lactic Acid, Venous: 0.6 mmol/L (ref 0.5–1.9)

## 2022-06-29 MED ORDER — ACETAMINOPHEN 325 MG PO TABS
650.0000 mg | ORAL_TABLET | Freq: Four times a day (QID) | ORAL | Status: DC | PRN
  Administered 2022-07-01 – 2022-07-03 (×2): 650 mg via ORAL
  Filled 2022-06-29 (×2): qty 2

## 2022-06-29 MED ORDER — ONDANSETRON HCL 4 MG PO TABS: 4.0000 mg | ORAL_TABLET | Freq: Four times a day (QID) | ORAL | Status: DC | PRN

## 2022-06-29 MED ORDER — ONDANSETRON HCL 4 MG/2ML IJ SOLN: 4.0000 mg | Freq: Four times a day (QID) | INTRAMUSCULAR | Status: DC | PRN

## 2022-06-29 MED ORDER — PREDNISONE 20 MG PO TABS
40.0000 mg | ORAL_TABLET | Freq: Every day | ORAL | Status: AC
  Administered 2022-07-01 – 2022-07-03 (×3): 40 mg via ORAL
  Filled 2022-06-29 (×3): qty 2

## 2022-06-29 MED ORDER — INSULIN ASPART 100 UNIT/ML IJ SOLN
0.0000 [IU] | Freq: Three times a day (TID) | INTRAMUSCULAR | Status: DC
  Administered 2022-06-30 (×2): 1 [IU] via SUBCUTANEOUS
  Administered 2022-06-30 – 2022-07-01 (×2): 3 [IU] via SUBCUTANEOUS
  Administered 2022-07-01: 2 [IU] via SUBCUTANEOUS
  Administered 2022-07-02 (×2): 3 [IU] via SUBCUTANEOUS
  Administered 2022-07-03: 5 [IU] via SUBCUTANEOUS
  Administered 2022-07-03: 2 [IU] via SUBCUTANEOUS
  Administered 2022-07-04: 3 [IU] via SUBCUTANEOUS
  Administered 2022-07-04: 1 [IU] via SUBCUTANEOUS
  Administered 2022-07-05: 5 [IU] via SUBCUTANEOUS
  Administered 2022-07-05: 1 [IU] via SUBCUTANEOUS
  Administered 2022-07-06 – 2022-07-07 (×3): 2 [IU] via SUBCUTANEOUS
  Filled 2022-06-29 (×16): qty 1

## 2022-06-29 MED ORDER — EZETIMIBE 10 MG PO TABS
10.0000 mg | ORAL_TABLET | Freq: Every day | ORAL | Status: DC
  Administered 2022-06-30 – 2022-07-06 (×7): 10 mg via ORAL
  Filled 2022-06-29 (×7): qty 1

## 2022-06-29 MED ORDER — SODIUM CHLORIDE 0.9% FLUSH
3.0000 mL | Freq: Two times a day (BID) | INTRAVENOUS | Status: DC
  Administered 2022-06-30 – 2022-07-07 (×12): 3 mL via INTRAVENOUS

## 2022-06-29 MED ORDER — FUROSEMIDE 10 MG/ML IJ SOLN
40.0000 mg | Freq: Every day | INTRAMUSCULAR | Status: DC
  Administered 2022-06-29 – 2022-07-01 (×3): 40 mg via INTRAVENOUS
  Filled 2022-06-29 (×3): qty 4

## 2022-06-29 MED ORDER — SODIUM CHLORIDE 0.9 % IV SOLN: 500.0000 mg | INTRAVENOUS | Status: DC

## 2022-06-29 MED ORDER — BUDESONIDE 0.5 MG/2ML IN SUSP
0.5000 mg | Freq: Two times a day (BID) | RESPIRATORY_TRACT | Status: DC
  Administered 2022-06-29 – 2022-07-07 (×15): 0.5 mg via RESPIRATORY_TRACT
  Filled 2022-06-29 (×16): qty 2

## 2022-06-29 MED ORDER — FLUOXETINE HCL 20 MG PO CAPS
20.0000 mg | ORAL_CAPSULE | Freq: Two times a day (BID) | ORAL | Status: DC
  Administered 2022-06-30 – 2022-07-07 (×14): 20 mg via ORAL
  Filled 2022-06-29 (×14): qty 1

## 2022-06-29 MED ORDER — ALBUTEROL SULFATE (2.5 MG/3ML) 0.083% IN NEBU
6.0000 mL | INHALATION_SOLUTION | Freq: Once | RESPIRATORY_TRACT | Status: AC
  Administered 2022-06-29: 6 mL via RESPIRATORY_TRACT
  Filled 2022-06-29: qty 6

## 2022-06-29 MED ORDER — IPRATROPIUM-ALBUTEROL 0.5-2.5 (3) MG/3ML IN SOLN
3.0000 mL | Freq: Four times a day (QID) | RESPIRATORY_TRACT | Status: DC
  Administered 2022-06-29 – 2022-06-30 (×3): 3 mL via RESPIRATORY_TRACT
  Filled 2022-06-29 (×3): qty 3

## 2022-06-29 MED ORDER — SODIUM CHLORIDE 0.9 % IV BOLUS
500.0000 mL | Freq: Once | INTRAVENOUS | Status: AC
  Administered 2022-06-29: 500 mL via INTRAVENOUS

## 2022-06-29 MED ORDER — LEVOTHYROXINE SODIUM 50 MCG PO TABS
125.0000 ug | ORAL_TABLET | Freq: Every day | ORAL | Status: DC
  Administered 2022-07-01 – 2022-07-07 (×7): 125 ug via ORAL
  Filled 2022-06-29 (×7): qty 1

## 2022-06-29 MED ORDER — SODIUM CHLORIDE 0.9 % IV SOLN
1.0000 g | Freq: Once | INTRAVENOUS | Status: AC
  Administered 2022-06-29: 1 g via INTRAVENOUS
  Filled 2022-06-29: qty 10

## 2022-06-29 MED ORDER — SODIUM CHLORIDE 0.9 % IV SOLN
100.0000 mg | Freq: Two times a day (BID) | INTRAVENOUS | Status: AC
  Administered 2022-06-30 – 2022-07-06 (×14): 100 mg via INTRAVENOUS
  Filled 2022-06-29 (×14): qty 100

## 2022-06-29 MED ORDER — ENOXAPARIN SODIUM 60 MG/0.6ML IJ SOSY
0.5000 mg/kg | PREFILLED_SYRINGE | INTRAMUSCULAR | Status: DC
  Administered 2022-06-29 – 2022-07-01 (×3): 52.5 mg via SUBCUTANEOUS
  Filled 2022-06-29 (×3): qty 0.6

## 2022-06-29 MED ORDER — METHYLPREDNISOLONE SODIUM SUCC 125 MG IJ SOLR
125.0000 mg | Freq: Once | INTRAMUSCULAR | Status: AC
  Administered 2022-06-29: 125 mg via INTRAVENOUS
  Filled 2022-06-29: qty 2

## 2022-06-29 MED ORDER — ACETAMINOPHEN 650 MG RE SUPP: 650.0000 mg | Freq: Four times a day (QID) | RECTAL | Status: DC | PRN

## 2022-06-29 MED ORDER — POLYETHYLENE GLYCOL 3350 17 G PO PACK: 17.0000 g | PACK | Freq: Every day | ORAL | Status: DC | PRN

## 2022-06-29 NOTE — ED Notes (Signed)
Pt o2 saturation dropping into low 80's on 4LNC. MD Mumma at bedside. RN called respiratory for bipap placement per MD Mumma.

## 2022-06-29 NOTE — ED Notes (Signed)
Pt placed on bipap  

## 2022-06-29 NOTE — Assessment & Plan Note (Signed)
Patient presenting with increased lethargy and hypercapnia.  Diffuse wheezing on examination.  Due to patient's altered mental status, unable to clarify if increased respiratory symptoms over the last several days.  - Solu-Medrol 125 mg once - Start prednisone 40 mg tomorrow to complete a 5-day course - Continue Ceftriaxone and azithromycin - RVP and procalcitonin pending - DuoNebs every 6 hours - Continue home bronchodilators

## 2022-06-29 NOTE — Assessment & Plan Note (Signed)
Per patient's daughter, patient has been experiencing gradually worsening lower extremity edema despite oral Lasix.  BNP is elevated today at 245 compared to 88 three months prior.  Most recent echocardiogram in January 2024 with preserved EF of 55-60% and grade 1 diastolic dysfunction.  - Start Lasix 40 mg IV daily - Strict in and out - Daily weights

## 2022-06-29 NOTE — Assessment & Plan Note (Signed)
Patient presenting with increased lethargy, most likely secondary to hypercapnia.  Patient's daughter was concerned for possible UTI.  Urinalysis results demonstrates leukocytes and bacteria, however unable to verify if patient is experiencing any urinary symptoms.  She is already receiving Rocephin for COPD exacerbation.  - Management of hypercapnia as noted above - Urine culture pending - Continue Rocephin

## 2022-06-29 NOTE — Assessment & Plan Note (Signed)
-   Restart home Synthroid when mentation improves

## 2022-06-29 NOTE — Assessment & Plan Note (Signed)
Per patient's daughter patient has a CPAP at home but has not yet had an at least 5 to 10 years.  - Encourage patient to use CPAP prior to discharge.  May ultimately benefit from BiPAP

## 2022-06-29 NOTE — Assessment & Plan Note (Signed)
Patient is presenting with increased lethargy over the past several days in addition to fever.  She was found to be obtunded at oncology office and was sent to the ED.  VBG at this time demonstrates pCO2 of 70, but however with compensation given pH remains 7.36.  Overall, likely multifactorial in the setting of COPD exacerbation and heart failure exacerbation.  - Continue BiPAP - Repeat VBG this evening - Transition to home supplemental oxygen when able - Management of COPD and HF as noted below

## 2022-06-29 NOTE — ED Triage Notes (Signed)
First RN note-  Per provider at the cancer center- PT has acute on chronic anemia with new leucocytosis. Pt has had a fever of 123f. Per provider pt appeared obtunded and lethargic.

## 2022-06-29 NOTE — Progress Notes (Addendum)
[name] CONSULT NOTE  Patient Care Team: Joaquim Nam, MD as PCP - General Mariah Milling, Tollie Pizza, MD as Consulting Physician (Cardiology) Oneta Rack, NP as Nurse Practitioner (Adult Health Nurse Practitioner) Earna Coder, MD as Consulting Physician (Oncology)  CHIEF COMPLAINTS/PURPOSE OF CONSULTATION: ANEMIA  HEMATOLOGY HISTORY:  # CHRONIC INTERMITTENT ANEMIA  [since 2010]- Hb SEP 2022- 8; previously on PO Iron- ; WBC/platelets- Normal; MCV- 90s. EGD- > 15 years; /Colonoscopy:4 years- [Dr.Jacob;; in [place]] capsule-? Bone marrow Biopsy-none; OCT 2022- SEVERE IRON DEFICIENCY; myeloma work-up/hemolysis work-up negative; JAN 2023-CT scan duodenal thickening-recommend GI evaluation  #   AUG 2021- RIGHT LUNG stage I non-small cell lung cancer-  s/p SBRT   [Dr.Moody; [place]]; NOV 2022- CT chest-no recurrent disease.  # COPD on 2.5 lit/day [Dr.Sood]; diastolic CHF/ OCT 2022-Low BP [needing to come off losartan- Dr.Gollan]; peripheral neuropathy.  HISTORY OF PRESENTING ILLNESS: Alone; 2.5 Lit/ O2 24 x7. In wheel chair.  patient's  friend in the room.   Alexandria [name] 87 y.o.  female with chronic respiratory failure on oxygen and severe anemia secondary to iron deficiency of unclear etiology, s/p venofer and transfusion returns to clinic for sooner evaluation. She received 2 units of pRBCs last week for hemoglobin of 6.8. Continues to feel poorly. Worse. Caregiver who accompanies patient says she goes to bathroom every 2 hours which isn't unusual fo rher. Was running fever of 102 a few days ago. In increasingly lethargic. No improvement after transfusion last week. Has black stools which was believed to be from eating chocolate. Short of breath. Wears oxygen chronically. History & ROS primarily provided by [name], patient's daughter by phone who lives in [place] and [place], her caregiver.   Review of Systems  Unable to perform ROS: Mental status change   Constitutional:  Positive for fever, malaise/fatigue and weight loss. Negative for chills.  Respiratory:  Positive for cough and shortness of breath.   Cardiovascular:  Negative for chest pain.  Gastrointestinal:  Positive for melena. Negative for abdominal pain and blood in stool.  Genitourinary:  Positive for frequency. Negative for dysuria, flank pain, hematuria and urgency.  Neurological:  Positive for dizziness and weakness. Negative for loss of consciousness.    MEDICAL HISTORY:  Past Medical History:  Diagnosis Date   Allergy, unspecified not elsewhere classified    Anxiety    Aortic stenosis, mild 03/30/2022   TTE 03/29/2022: EF 55-60, no RWMA, GR 1 DD, normal RVSF, mild aortic stenosis (mean 10, V-max 212 cm/s, DI 0.70)   Cataract    Chronic diastolic congestive heart failure    COPD (chronic obstructive pulmonary disease) 03/08/1998   PFTs 12/12/2002 FEV 1 1.42 (64%) ratio 58 with no better after B2 and DLCO75%; PFTs 11/20/09 FEV1 1.50 (73%) ratio 50 no better after B2 with DLCO 62%; Hfa 50% 11/20/2009 >75%, 01/13/10 p coaching   Depression 12/07/1998   Diabetes mellitus type II 02/05/2006   Dr. Elvera Lennox with endo   Disorders of bursae and tendons in shoulder region, unspecified    Rotator cuff syndrome, right   E. coli bacteremia    Esophagitis    GERD (gastroesophageal reflux disease)    History of UTI    Hyperlipidemia 10/04/2000   Hypertension 03/08/1992   Hypothyroidism 03/08/1968   Iron deficiency anemia    Lung nodule    radiation starts 10-08-2019   Malignant neoplasm of right upper lobe of lung 07/24/2019   Obesity    NOS   OSA (obstructive sleep  apnea)    PSG 01/27/10 AHI 13, pt does not know CPAP settings   Peripheral neuropathy    Likely due to DM per Dr. Tresa Endo hernia     SURGICAL HISTORY: Past Surgical History:  Procedure Laterality Date   ABD U/S  03/19/1999   Nml x2 foci in liver   ADENOSINE MYOVIEW  06/02/2007   Nml   CARDIOLITE  PERSANTINE  08/24/2000   Nml   CAROTID U/S  08/24/2000   1-39% ICA stenosis   CAROTID U/S  06/02/2007   No apprec change    CARPAL TUNNEL RELEASE  12/1997   Right   CESAREAN SECTION     x2 [place]/ repeat   CHOLECYSTECTOMY  1997   COLONOSCOPY WITH PROPOFOL N/A 02/12/2016   Procedure: COLONOSCOPY WITH PROPOFOL;  Surgeon: Rachael Fee, MD;  Location: [facility];  Service: Endoscopy;  Laterality: N/A;   CT ABD W & PELVIS WO/W CM     Abd hemangiomas of liver, 1 cm R renal cyst   DENTAL SURGERY  2016   Implants   DEXA  07/03/2003   Nml   ESOPHAGOGASTRODUODENOSCOPY  12/05/1997   Nml (due to hoarseness)   ESOPHAGOGASTRODUODENOSCOPY (EGD) WITH PROPOFOL N/A 02/12/2016   Procedure: ESOPHAGOGASTRODUODENOSCOPY (EGD) WITH PROPOFOL;  Surgeon: Rachael Fee, MD;  Location: [facility];  Service: Endoscopy;  Laterality: N/A;   GALLBLADDER SURGERY     HERNIA REPAIR  01/24/2009   Lap Ventr w/ Lysis of adhesions (Dr. Dwain Sarna)   knee arthroscopic surgery  years ago   right   ROTATOR CUFF REPAIR  1984   Right, [place]   SHOULDER OPEN ROTATOR CUFF REPAIR  02/08/2012   Procedure: ROTATOR CUFF REPAIR SHOULDER OPEN;  Surgeon: Drucilla Schmidt, MD;  Location: [facility];  Service: Orthopedics;  Laterality: Left;  Left Shoulder Open Anterior Acrominectomy Rotator Cuff Repair Open Distal Clavicle Resection ,tissue mend graft, and repair of biceps tendon   THUMB RELEASE  12/1997   Right   TONSILLECTOMY     TOTAL ABDOMINAL HYSTERECTOMY  1985   Due to dysmennorhea   US ECHOCARDIOGRAPHY  06/02/2007    SOCIAL HISTORY: Social History   Socioeconomic History   Marital status: Widowed    Spouse name: Not on file   Number of children: 2   Years of education: Not on file   Highest education level: Not on file  Occupational History   Occupation: Retired    Associate Professor: RETIRED  Tobacco Use   Smoking status: Former    Packs/day: 2.00    Years: 50.00    Additional pack years: 0.00    Total pack  years: 100.00    Types: Cigarettes    Quit date: 02/05/2005    Years since quitting: 17.4   Smokeless tobacco: Never  Vaping Use   Vaping Use: Never used  Substance and Sexual Activity   Alcohol use: Yes    Comment: occasionally   Drug use: Never   Sexual activity: Not Currently  Other Topics Concern   Not on file  Social History Narrative   Widowed 2023, 2 children; Enjoys painting      Quit smoking in 2007; no alcohol; lives in [place]; [name]/d lives in [place]. Had own business. Dont drive- sec to neuropathy.    Social Determinants of Health   Financial Resource Strain: Not on file  Food Insecurity: Not on file  Transportation Needs: Not on file  Physical Activity: Not on file  Stress: Not on file  Social Connections: Not on file  Intimate Partner Violence: Not on file    FAMILY HISTORY: Family History  Problem Relation Age of Onset   Heart disease Mother    Thyroid disease Mother    Emphysema Father        One lung   Cystic fibrosis Sister    Hyperthyroidism Sister    Osteoarthritis Brother    Hyperthyroidism Brother    Esophageal cancer Neg Hx    Cancer Neg Hx        Head or neck   Colon cancer Neg Hx    Stomach cancer Neg Hx    Breast cancer Neg Hx     ALLERGIES:  is allergic to antihistamines, diphenhydramine-type; sulfonamide derivatives; incruse ellipta [umeclidinium bromide]; clarithromycin; codeine; lyrica [pregabalin]; spiriva handihaler [tiotropium bromide monohydrate]; and vraylar [cariprazine].  MEDICATIONS:  Current Outpatient Medications  Medication Sig Dispense Refill   albuterol (PROAIR HFA) 108 (90 Base) MCG/ACT inhaler Inhale 2 puffs into the lungs every 6 (six) hours as needed for wheezing or shortness of breath. INHALE 2 PUFFS INTO THE LUNGS EVERY 6 HOURS AS NEEDED FOR WHEEZING OR SHORTNESS OF BREATH 3 Inhaler 3   arformoterol (BROVANA) 15 MCG/2ML NEBU Take 2 mLs (15 mcg total) by nebulization at bedtime. 120 mL 6   budesonide  (PULMICORT) 0.5 MG/2ML nebulizer solution Take 2 mLs (0.5 mg total) by nebulization in the morning and at bedtime. 360 mL 3   busPIRone (BUSPAR) 15 MG tablet Take 1 tablet (15 mg total) by mouth 2 (two) times daily. With extra 1/2 tab midday (Patient taking differently: Take 15 mg by mouth 3 (three) times daily. With extra 1/2 tab midday)     Cholecalciferol (VITAMIN D) 50 MCG (2000 UT) tablet Take 1 tablet (2,000 Units total) by mouth daily.     Coenzyme Q-10 200 MG CAPS Take 200 mg by mouth daily.     Cyanocobalamin (B-12) 5000 MCG CAPS Take 5,000 mcg by mouth daily.     esomeprazole (NEXIUM) 40 MG capsule Take 1 capsule (40 mg total) by mouth daily at 12 noon. 30 capsule 11   ezetimibe (ZETIA) 10 MG tablet TAKE 1 TABLET BY MOUTH EVERYDAY AT BEDTIME 90 tablet 2   ferrous sulfate 325 (65 FE) MG tablet TAKE 1 TABLET BY MOUTH EVERY DAY 90 tablet 3   fexofenadine (ALLEGRA) 180 MG tablet Take 180 mg by mouth as needed.     FLUoxetine (PROZAC) 20 MG tablet Take 20 mg by mouth 2 (two) times daily.     fluticasone (FLONASE) 50 MCG/ACT nasal spray USE 2 SPRAYS INTO THE NOSE EVERY 12 HOURS AS NEEDED FOR STUFFY NOSE 48 mL 2   glucose blood (ONETOUCH VERIO) test strip USE AS INSTRUCTED TO CHECK SUGAR 1 TIME DAILY 100 strip 3   guaiFENesin (MUCINEX) 600 MG 12 hr tablet Take 2 tablets (1,200 mg total) by mouth 2 (two) times daily as needed for cough or to loosen phlegm.     isosorbide mononitrate (IMDUR) 30 MG 24 hr tablet Take 1 tablet (30 mg total) by mouth daily. 90 tablet 3   levothyroxine (SYNTHROID) 125 MCG tablet Take 1 tablet (125 mcg total) by mouth daily. 90 tablet 3   lidocaine (LIDODERM) 5 % Place 1 patch onto the skin every 12 (twelve) hours. Remove & Discard patch within 12 hours or as directed by MD 10 patch 0   magnesium 30 MG tablet Take 30 mg by mouth 2 (two) times daily.  metFORMIN (GLUCOPHAGE) 500 MG tablet Take 1 tablet (500 mg total) by mouth every evening. 90 tablet 3   nystatin  (MYCOSTATIN) 100000 UNIT/ML suspension Take 5 mLs (500,000 Units total) by mouth 4 (four) times daily. 120 mL 0   OneTouch Delica Lancets 33G MISC Use 1x a day 100 each 3   potassium chloride (KLOR-CON) 10 MEQ tablet Take 1 tablet (10 mEq total) by mouth daily. (Patient taking differently: Take 10 mEq by mouth daily. Takes 10 meq potassium every other day with 10 mg torsemide Takes 20 meq potassium every other day with 20 mg torsemide) 90 tablet 3   rosuvastatin (CRESTOR) 10 MG tablet Take 1 tablet (10 mg total) by mouth every other day. 45 tablet 2   Spacer/Aero Chamber Mouthpiece MISC Use with  inhaler as needed.  J44.9 1 each 0   torsemide (DEMADEX) 20 MG tablet Take 20 mg by mouth daily.     cyclobenzaprine (FLEXERIL) 10 MG tablet Take 0.5-1 tablets (5-10 mg total) by mouth 2 (two) times daily as needed for muscle spasms. (Patient not taking: Reported on 06/16/2022) 30 tablet 0   nitroGLYCERIN (NITROSTAT) 0.4 MG SL tablet Place 1 tablet (0.4 mg total) under the tongue every 5 (five) minutes as needed for chest pain. (Patient not taking: Reported on 06/23/2022) 25 tablet 3   No current facility-administered medications for this visit.      PHYSICAL EXAMINATION: Vitals:   06/29/22 1323  BP: 139/60  Pulse: 85  Temp: 98.1 F (36.7 C)  SpO2: 100%   Filed Weights   06/29/22 1323  Weight: 230 lb 11.2 oz (104.6 kg)   Physical Exam Vitals reviewed.  Constitutional:      Appearance: She is morbidly obese. She is ill-appearing.  Cardiovascular:     Rate and Rhythm: Normal rate.  Pulmonary:     Effort: No respiratory distress.     Comments: Decreased breath sounds bilaterally.  Scattered wheezing. On oxygen via [place].  Abdominal:     General: There is no distension.     Palpations: Abdomen is soft.     Tenderness: There is no abdominal tenderness.  Skin:    Coloration: Skin is pale.  Neurological:     Mental Status: She is lethargic.     Motor: Weakness present.     Comments: Falling  asleep during conversation. Rarely opens eyes. Lethargic and weak appearing.  Psychiatric:        Behavior: Behavior normal.        Judgment: Judgment normal.     LABORATORY DATA:  I have reviewed the data as listed Lab Results  Component Value Date   WBC 12.5 (H) 06/29/2022   HGB 7.7 (L) 06/29/2022   HCT 26.7 (L) 06/29/2022   MCV 100.4 (H) 06/29/2022   PLT 125 (L) 06/29/2022   Recent Labs    03/04/22 1537 03/10/22 1446 03/19/22 1507 06/16/22 1408 06/23/22 1235 06/29/22 1302  NA 142 141   < > 139 140 134*  K 4.0 4.1   < > 4.0 4.5 4.6  CL 103 96*   < > 102 98 94*  CO2 32 36*   < > 30 33* 33*  GLUCOSE 113* 137*   < > 192* 199* 174*  BUN 25* 34*   < > 20 32* 33*  CREATININE 1.05* 1.36*   < > 0.96 1.10* 1.38*  CALCIUM 8.8* 8.6*   < > 8.5* 8.4* 7.8*  GFRNONAA 52* 38*   < > 58* 50* 38*  PROT 6.9 7.2  --   --   --  6.5  ALBUMIN 3.6 3.7  --   --   --  2.9*  AST 17 17  --   --   --  16  ALT 15 12  --   --   --  15  ALKPHOS 82 84  --   --   --  104  BILITOT 0.4 0.4  --   --   --  0.5   < > = values in this interval not displayed.   No results found.   Assessment & Plan:   Symptomatic Anemia- 12/2020 ferritin 7. Symptomatic and chronic intermittent since 2010. S/p IV Venofer. April 2024, anemia worsening despite diron infusions. Iron sat 10%, ferritin 26. Question other etiologies including blood loss vs primary bone marrow disorders- additional workup was declined and she elected for symptomatic care. Hemoglobin baseline around 9-10. Dropped to 8.5 in February 2024 --> 7.2 (06/2022) -->> 6.4 (06/23/22). She received 2 units of pRBCs. Today, hemoglobin has improved to 7.7 however, clinically, patient is increasingly symptomatic (see below). Discussed receiving IV iron today then blood transfusion later this week however, given mental status change recommend ER evaluation.  Iron Deficiency Anemia- worse. 03/2022- ferritin 101, April 2024 dropped to 26. Concern for acute blood loss.  Not a candidate for invasive procedures due to medical comorbidities. Last received IV iron on 06/23/22. Suspect she will need continued iv iron vs transfusional support. We will see her back at [facility] upon discharge for monitoring of counts and possible venofer.  Altered mental status- obtunded in clinic. New leukocytosis (mild). Recent fever at home but now resolved. No tachycardia or hypotension. Concern for infection vs others? Recommend ER for evaluation and further workup. Daughter and patient agree.   Disposition:  Transfer to ER Can see patient back at [facility] upon discharge  No problem-specific Assessment & Plan notes found for this encounter.  All questions were answered. The patient knows to call the clinic with any problems, questions or concerns.   Alinda Dooms, NP 06/29/2022

## 2022-06-29 NOTE — Progress Notes (Signed)
PHARMACIST - PHYSICIAN COMMUNICATION  CONCERNING:  Enoxaparin (Lovenox) for DVT Prophylaxis    RECOMMENDATION: Patient was prescribed enoxaprin  q24 hours for VTE prophylaxis.   Filed Weights   06/29/22 1524  Weight: 104.6 kg (230 lb 9.6 oz)    Body mass index is 38.37 kg/m.  Estimated Creatinine Clearance: 36.9 mL/min (A) (by C-G formula based on SCr of 1.36 mg/dL (H)).   Based on [facility] policy patient is candidate for enoxaparin 0.5mg /kg TBW SQ every 24 hours based on BMI being >30.   DESCRIPTION: Pharmacy has adjusted enoxaparin dose per [facility] policy.  Patient is now receiving enoxaparin 0.5 mg/kg every 24 hours   Will M. Dareen Piano, PharmD PGY-1 Pharmacy Resident 06/29/2022 6:56 PM

## 2022-06-29 NOTE — ED Notes (Signed)
Full rainbow and 2 sets of cultures sent to lab

## 2022-06-29 NOTE — Assessment & Plan Note (Signed)
Patient is currently managed on metformin only with last A1c of 5.3% approximately 3 months ago.  - SSI, sensitive - Hold home metformin

## 2022-06-29 NOTE — ED Provider Notes (Addendum)
[facility] Provider Note    Event Date/Time   First MD Initiated Contact with Patient 06/29/22 1454     (approximate)   History   Altered Mental Status   HPI  Alexandria [name] is a 87 y.o. female past med history significant for COPD on chronic 2.5 to 3 L of home oxygen, chronic iron deficiency anemia that has required blood transfusions, presents to the emergency department with altered mental status.  Patient was sent from cancer center where she was being seen following blood transfusions with sent to the emergency department given concern for altered mental status and somnolence.  Patient is here with her caregiver who gives most of the history.  States that she seems to be close to her normal state of health and that whenever she does not sleep well she gets very tired.  States that she is normally up late at night urinating frequently from her Lasix pills.  Denies any recent falls or trauma.  Endorses a fever a couple of days ago but states that had only 1 episode of fever and it went away.  Ongoing chronic cough has been nonproductive.  Denies any dysuria.  No abdominal pain, nausea or vomiting.  Complaining of a headache but denies any recent falls or head trauma.  Not on anticoagulation.  Has not noticed any melena but thought she maybe had some dark stools a couple of days ago.     Physical Exam   Triage Vital Signs: ED Triage Vitals  Enc Vitals Group     BP 06/29/22 1436 (!) 115/50     Pulse Rate 06/29/22 1436 81     Resp 06/29/22 1436 18     Temp 06/29/22 1436 98 F (36.7 C)     Temp Source 06/29/22 1436 Oral     SpO2 06/29/22 1436 93 %     Weight --      Height --      Head Circumference --      Peak Flow --      Pain Score 06/29/22 1440 0     Pain Loc --      Pain Edu? --      Excl. in GC? --     Most recent vital signs: Vitals:   06/29/22 1731 06/29/22 1800  BP:  (!) 110/48  Pulse: 72 76  Resp: 14 15  Temp:    SpO2: 100% 100%     Physical Exam Constitutional:      Appearance: She is well-developed.     Comments: Sitting in a wheelchair, nodding off and then will wake up when you talk to her  HENT:     Head: Atraumatic.     Mouth/Throat:     Mouth: Mucous membranes are moist.  Eyes:     Conjunctiva/sclera: Conjunctivae normal.     Pupils: Pupils are equal, round, and reactive to light.  Cardiovascular:     Rate and Rhythm: Regular rhythm.  Pulmonary:     Effort: No respiratory distress.     Breath sounds: Wheezing present.     Comments: 3 L nasal cannula with diffuse inspiratory and expiratory wheezing throughout all lung fields. Abdominal:     General: There is no distension.     Tenderness: There is no abdominal tenderness.  Musculoskeletal:        General: Normal range of motion.     Cervical back: Normal range of motion.     Right lower leg: No  edema.     Left lower leg: No edema.  Skin:    General: Skin is warm.     Capillary Refill: Capillary refill takes less than 2 seconds.  Neurological:     Mental Status: She is alert. Mental status is at baseline.     IMPRESSION / MDM / ASSESSMENT AND PLAN / ED COURSE  I reviewed the triage vital signs and the nursing notes.  On chart review patient recently received a blood transfusion for her chronic iron deficiency anemia.  Evaluated earlier today at cancer center where she is followed for her chronic anemia, found to be obtunded with no leukocytosis.  Recent fever at home and concern for infection so was sent to the emergency department.  Differential diagnosis including infectious process, urinary tract infection, pneumonia, hypercarbia, COPD exacerbation, intracranial hemorrhage  EKG  I, Corena Herter, the attending physician, personally viewed and interpreted this ECG.   Rate: Normal  Rhythm: Normal sinus  Axis: Normal  Intervals: Normal  ST&T Change: None  No tachycardic or bradycardic dysrhythmias while on cardiac  telemetry.  RADIOLOGY I independently reviewed imaging, my interpretation of imaging: Chest x-ray with cardiomegaly, no focal findings consistent with pneumonia.  Read as cardiomegaly with venous congestion but no signs of pneumonia.  LABS (all labs ordered are listed, but only abnormal results are displayed) Labs interpreted as -    Labs Reviewed  COMPREHENSIVE METABOLIC PANEL - Abnormal; Notable for the following components:      Result Value   Chloride 95 (*)    CO2 38 (*)    Glucose, Bld 130 (*)    BUN 33 (*)    Creatinine, Ser 1.36 (*)    Calcium 7.8 (*)    Total Protein 6.3 (*)    Albumin 2.9 (*)    GFR, Estimated 38 (*)    Anion gap 3 (*)    All other components within normal limits  CBC WITH DIFFERENTIAL/PLATELET - Abnormal; Notable for the following components:   WBC 12.3 (*)    RBC 2.81 (*)    Hemoglobin 8.1 (*)    HCT 28.4 (*)    MCV 101.1 (*)    MCHC 28.5 (*)    RDW 18.1 (*)    Platelets 136 (*)    Neutro Abs 11.0 (*)    Lymphs Abs 0.3 (*)    Abs Immature Granulocytes 0.10 (*)    All other components within normal limits  URINALYSIS, W/ REFLEX TO CULTURE (INFECTION SUSPECTED) - Abnormal; Notable for the following components:   Color, Urine YELLOW (*)    APPearance HAZY (*)    Hgb urine dipstick MODERATE (*)    Protein, ur 30 (*)    Leukocytes,Ua LARGE (*)    Bacteria, UA MANY (*)    All other components within normal limits  BLOOD GAS, VENOUS - Abnormal; Notable for the following components:   pCO2, Ven 70 (*)    pO2, Ven 53 (*)    Bicarbonate 39.5 (*)    Acid-Base Excess 11.0 (*)    All other components within normal limits  BRAIN NATRIURETIC PEPTIDE - Abnormal; Notable for the following components:   B Natriuretic Peptide 245.1 (*)    All other components within normal limits  CULTURE, BLOOD (ROUTINE X 2)  CULTURE, BLOOD (ROUTINE X 2)  URINE CULTURE  LACTIC ACID, PLASMA  TROPONIN I (HIGH SENSITIVITY)     MDM  Lab work with leukocytosis of  12.3, anemia but hemoglobin appears close to  her baseline at 8.1.  Platelets are low at 136.  Clinical Course as of 06/29/22 1818  Tue Jun 29, 2022  1724 Patient found to have hypercarbia on VBG, appears compensated but do not have any prior VBG's to compare.  Called into the room from patient's nurse, patient is on 3 L nasal cannula increased from her baseline of 2.5 and continues to be hypoxic into the low 80s and 70s.  Increased to 4 L nasal cannula and improved to 90%.  Given her hypercarbia and altered mental status will attempt BiPAP.  Concern for COPD exacerbation, no obvious signs of pneumonia, no leukocytosis and normal lactic acid.  Given IV Solu-Medrol. [SM]  1818 UA concerning for urinary tract infection.  Given IV Rocephin.  Sent for reflex culture. [SM]    Clinical Course User Index [SM] Corena Herter, MD     PROCEDURES:  Critical Care performed: yes  .Critical Care  Performed by: Corena Herter, MD Authorized by: Corena Herter, MD   Critical care provider statement:    Critical care time (minutes):  30   Critical care time was exclusive of:  Separately billable procedures and treating other patients   Critical care was necessary to treat or prevent imminent or life-threatening deterioration of the following conditions:  Respiratory failure   Critical care was time spent personally by me on the following activities:  Development of treatment plan with patient or surrogate, discussions with consultants, evaluation of patient's response to treatment, examination of patient, ordering and review of laboratory studies, ordering and review of radiographic studies, ordering and performing treatments and interventions, pulse oximetry, re-evaluation of patient's condition and review of old charts   Patient's presentation is most consistent with acute presentation with potential threat to life or bodily function.   MEDICATIONS ORDERED IN ED: Medications  cefTRIAXone (ROCEPHIN) 1  g in sodium chloride 0.9 % 100 mL IVPB (has no administration in time range)  albuterol (PROVENTIL) (2.5 MG/3ML) 0.083% nebulizer solution 6 mL (6 mLs Nebulization Given 06/29/22 1550)  sodium chloride 0.9 % bolus 500 mL (500 mLs Intravenous New Bag/Given 06/29/22 1734)    FINAL CLINICAL IMPRESSION(S) / ED DIAGNOSES   Final diagnoses:  COPD exacerbation     Rx / DC Orders   ED Discharge Orders     None        Note:  This document was prepared using Dragon voice recognition software and may include unintentional dictation errors.   Corena Herter, MD 06/29/22 1745    Corena Herter, MD 06/29/22 [id number]

## 2022-06-29 NOTE — Assessment & Plan Note (Signed)
Patient has a history of severe symptomatic anemia, currently iron transfusion and blood transfusion dependent.  Follows with oncology.  Hemoglobin is stable at this time.  - Serial CBC while admitted - Transfuse for hemoglobin less than 7

## 2022-06-29 NOTE — Assessment & Plan Note (Signed)
-   Holding home antihypertensives in the setting of borderline low blood pressures

## 2022-06-29 NOTE — H&P (Signed)
History and Physical    Patient: Alexandria [name]:[id number] DOB: 16-Dec-1935 DOA: 06/29/2022 DOS: the patient was seen and examined on 06/29/2022 PCP: Joaquim Nam, MD  Patient coming from: Home  Chief Complaint:  Chief Complaint  Patient presents with   Altered Mental Status   HPI: Alexandria [name] is a 87 y.o. female with medical history significant of COPD with chronic hypoxic respiratory failure on 2.5 L of supplemental oxygen, iron deficiency anemia on chronic transfusions, HFpEF, type 2 diabetes, hypertension, hyperlipidemia, hypothyroidism, OSA, mild aortic stenosis, who presents to the ED due to altered mental status. History obtained from patient's daughter at bedside, [name], due to patient's altered mental status.  [name] states that patient's previous baseline functioning was diminished due to patient's comorbidities.  However, approximately 10 days ago, patient's lethargy and fatigue have become significantly worse.  [name] states that patient would regularly fall asleep throughout the day.  In addition, patient felt generally weak.  They believe that it may be due to her anemia and had a follow-up with her oncologist.  She had a iron transfusion on 4/17 and blood transfusion on 4/18.  Despite this, lethargy continued to worsen.  In addition, patient had a fever on 4/21 in 4/22.  They went to an oncology follow-up today and was sent to the ED.  [name] denies noticing any increased cough or shortness of breath, but states that her mother's cough and shortness of breath is severe at baseline.  She notes that patient has been experiencing gradually worsening lower extremity swelling despite taking Lasix daily.  No reported chest pain.  Per chart review, patient was following up with her oncologist today when she was sent to the ED due to altered mental status.  Per note review, patient was lethargic with scattered wheezing concerning for COPD exacerbation.  ED course: On arrival to the  ED, patient was normotensive at 115/50 with heart rate of 81.  She was saturating at 93% on 3 L but per EDP desaturated to 73% and was increased to 4 L.  Respiratory rate of 18/minute.  She was afebrile at 98.  Initial workup notable for WBC of 12.3, hemoglobin 8.1, platelets 136, potassium 4.6, bicarb 38, BUN 33, creatinine 1.36, and GFR of 38.  Troponin negative at 17.  VBG with pCO2 of 70 and pH of 7.36.  CT of the head was obtained with no acute intracranial abnormality.  Chest x-ray was obtained that demonstrated cardiomegaly with vascular congestion.  Patient placed on BiPAP and started on albuterol.  TRH contacted for admission.  Review of Systems: unable to review all systems due to the inability of the patient to answer questions.  Past Medical History:  Diagnosis Date   Allergy, unspecified not elsewhere classified    Anxiety    Aortic stenosis, mild 03/30/2022   TTE 03/29/2022: EF 55-60, no RWMA, GR 1 DD, normal RVSF, mild aortic stenosis (mean 10, V-max 212 cm/s, DI 0.70)   Cataract    Chronic diastolic congestive heart failure    COPD (chronic obstructive pulmonary disease) 03/08/1998   PFTs 12/12/2002 FEV 1 1.42 (64%) ratio 58 with no better after B2 and DLCO75%; PFTs 11/20/09 FEV1 1.50 (73%) ratio 50 no better after B2 with DLCO 62%; Hfa 50% 11/20/2009 >75%, 01/13/10 p coaching   Depression 12/07/1998   Diabetes mellitus type II 02/05/2006   Dr. Elvera Lennox with endo   Disorders of bursae and tendons in shoulder region, unspecified    Rotator cuff syndrome, right  E. coli bacteremia    Esophagitis    GERD (gastroesophageal reflux disease)    History of UTI    Hyperlipidemia 10/04/2000   Hypertension 03/08/1992   Hypothyroidism 03/08/1968   Iron deficiency anemia    Lung nodule    radiation starts 10-08-2019   Malignant neoplasm of right upper lobe of lung 07/24/2019   Obesity    NOS   OSA (obstructive sleep apnea)    PSG 01/27/10 AHI 13, pt does not know CPAP settings    Peripheral neuropathy    Likely due to DM per Dr. Tresa Endo hernia    Past Surgical History:  Procedure Laterality Date   ABD U/S  03/19/1999   Nml x2 foci in liver   ADENOSINE MYOVIEW  06/02/2007   Nml   CARDIOLITE PERSANTINE  08/24/2000   Nml   CAROTID U/S  08/24/2000   1-39% ICA stenosis   CAROTID U/S  06/02/2007   No apprec change    CARPAL TUNNEL RELEASE  12/1997   Right   CESAREAN SECTION     x2 [place]/ repeat   CHOLECYSTECTOMY  1997   COLONOSCOPY WITH PROPOFOL N/A 02/12/2016   Procedure: COLONOSCOPY WITH PROPOFOL;  Surgeon: Rachael Fee, MD;  Location: [facility];  Service: Endoscopy;  Laterality: N/A;   CT ABD W & PELVIS WO/W CM     Abd hemangiomas of liver, 1 cm R renal cyst   DENTAL SURGERY  2016   Implants   DEXA  07/03/2003   Nml   ESOPHAGOGASTRODUODENOSCOPY  12/05/1997   Nml (due to hoarseness)   ESOPHAGOGASTRODUODENOSCOPY (EGD) WITH PROPOFOL N/A 02/12/2016   Procedure: ESOPHAGOGASTRODUODENOSCOPY (EGD) WITH PROPOFOL;  Surgeon: Rachael Fee, MD;  Location: [facility];  Service: Endoscopy;  Laterality: N/A;   GALLBLADDER SURGERY     HERNIA REPAIR  01/24/2009   Lap Ventr w/ Lysis of adhesions (Dr. Dwain Sarna)   knee arthroscopic surgery  years ago   right   ROTATOR CUFF REPAIR  1984   Right, [place]   SHOULDER OPEN ROTATOR CUFF REPAIR  02/08/2012   Procedure: ROTATOR CUFF REPAIR SHOULDER OPEN;  Surgeon: Drucilla Schmidt, MD;  Location: [facility];  Service: Orthopedics;  Laterality: Left;  Left Shoulder Open Anterior Acrominectomy Rotator Cuff Repair Open Distal Clavicle Resection ,tissue mend graft, and repair of biceps tendon   THUMB RELEASE  12/1997   Right   TONSILLECTOMY     TOTAL ABDOMINAL HYSTERECTOMY  1985   Due to dysmennorhea   US ECHOCARDIOGRAPHY  06/02/2007   Social History:  reports that she quit smoking about 17 years ago. Her smoking use included cigarettes. She has a 100.00 pack-year smoking history. She has never used smokeless  tobacco. She reports current alcohol use. She reports that she does not use drugs.  Allergies  Allergen Reactions   Antihistamines, Diphenhydramine-Type Other (See Comments)    Able to tolerate only allegra.    Sulfonamide Derivatives Swelling    REACTION: closed throat   Incruse Ellipta [Umeclidinium Bromide] Cough    Caused voice change and severe coughing   Clarithromycin Other (See Comments)    REACTION: diff swallowing and mouth blisters   Codeine Other (See Comments)    Unknown    Lyrica [Pregabalin] Other (See Comments)    Lack of effect for neuropathy pain.     Spiriva Handihaler [Tiotropium Bromide Monohydrate] Other (See Comments)    Voice changes   Vraylar [Cariprazine]     intolerant  Family History  Problem Relation Age of Onset   Heart disease Mother    Thyroid disease Mother    Emphysema Father        One lung   Cystic fibrosis Sister    Hyperthyroidism Sister    Osteoarthritis Brother    Hyperthyroidism Brother    Esophageal cancer Neg Hx    Cancer Neg Hx        Head or neck   Colon cancer Neg Hx    Stomach cancer Neg Hx    Breast cancer Neg Hx     Prior to Admission medications   Medication Sig Start Date End Date Taking? Authorizing Provider  albuterol (PROAIR HFA) 108 (90 Base) MCG/ACT inhaler Inhale 2 puffs into the lungs every 6 (six) hours as needed for wheezing or shortness of breath. INHALE 2 PUFFS INTO THE LUNGS EVERY 6 HOURS AS NEEDED FOR WHEEZING OR SHORTNESS OF BREATH 04/19/18   Coralyn Helling, MD  arformoterol (BROVANA) 15 MCG/2ML NEBU Take 2 mLs (15 mcg total) by nebulization at bedtime. 12/01/21   Coralyn Helling, MD  budesonide (PULMICORT) 0.5 MG/2ML nebulizer solution Take 2 mLs (0.5 mg total) by nebulization in the morning and at bedtime. 11/20/21   Coralyn Helling, MD  busPIRone (BUSPAR) 15 MG tablet Take 1 tablet (15 mg total) by mouth 2 (two) times daily. With extra 1/2 tab midday Patient taking differently: Take 15 mg by mouth 3 (three) times  daily. With extra 1/2 tab midday 12/31/21   Joaquim Nam, MD  Cholecalciferol (VITAMIN D) 50 MCG (2000 UT) tablet Take 1 tablet (2,000 Units total) by mouth daily. 06/27/18   Joaquim Nam, MD  Coenzyme Q-10 200 MG CAPS Take 200 mg by mouth daily.    [provider]  Cyanocobalamin (B-12) 5000 MCG CAPS Take 5,000 mcg by mouth daily.    [provider]  cyclobenzaprine (FLEXERIL) 10 MG tablet Take 0.5-1 tablets (5-10 mg total) by mouth 2 (two) times daily as needed for muscle spasms. Patient not taking: Reported on 06/16/2022 03/12/22   Joaquim Nam, MD  esomeprazole (NEXIUM) 40 MG capsule Take 1 capsule (40 mg total) by mouth daily at 12 noon. 08/17/21   Unk Lightning, PA  ezetimibe (ZETIA) 10 MG tablet TAKE 1 TABLET BY MOUTH EVERYDAY AT BEDTIME 03/18/22   Joaquim Nam, MD  ferrous sulfate 325 (65 FE) MG tablet TAKE 1 TABLET BY MOUTH EVERY DAY 04/05/22   Joaquim Nam, MD  fexofenadine (ALLEGRA) 180 MG tablet Take 180 mg by mouth as needed.    [provider]  FLUoxetine (PROZAC) 20 MG tablet Take 20 mg by mouth 2 (two) times daily.    [provider]  fluticasone (FLONASE) 50 MCG/ACT nasal spray USE 2 SPRAYS INTO THE NOSE EVERY 12 HOURS AS NEEDED FOR STUFFY NOSE 04/28/22   Joaquim Nam, MD  glucose blood (ONETOUCH VERIO) test strip USE AS INSTRUCTED TO CHECK SUGAR 1 TIME DAILY 03/16/22   Carlus Pavlov, MD  guaiFENesin (MUCINEX) 600 MG 12 hr tablet Take 2 tablets (1,200 mg total) by mouth 2 (two) times daily as needed for cough or to loosen phlegm. 08/25/21   Coralyn Helling, MD  isosorbide mononitrate (IMDUR) 30 MG 24 hr tablet Take 1 tablet (30 mg total) by mouth daily. 02/23/22   Tereso Newcomer T, PA-C  levothyroxine (SYNTHROID) 125 MCG tablet Take 1 tablet (125 mcg total) by mouth daily. 09/11/21   Carlus Pavlov, MD  lidocaine (LIDODERM)  5 % Place 1 patch onto the skin every 12 (twelve) hours. Remove & Discard patch within 12 hours or as  directed by MD 03/04/22 03/04/23  Chesley Noon, MD  magnesium 30 MG tablet Take 30 mg by mouth 2 (two) times daily.    [provider]  metFORMIN (GLUCOPHAGE) 500 MG tablet Take 1 tablet (500 mg total) by mouth every evening. 03/16/22   Carlus Pavlov, MD  nitroGLYCERIN (NITROSTAT) 0.4 MG SL tablet Place 1 tablet (0.4 mg total) under the tongue every 5 (five) minutes as needed for chest pain. Patient not taking: Reported on 06/23/2022 07/16/21   Joaquim Nam, MD  nystatin (MYCOSTATIN) 100000 UNIT/ML suspension Take 5 mLs (500,000 Units total) by mouth 4 (four) times daily. 05/11/22   Joaquim Nam, MD  OneTouch Delica Lancets 33G MISC Use 1x a day 03/16/22   Carlus Pavlov, MD  potassium chloride (KLOR-CON) 10 MEQ tablet Take 1 tablet (10 mEq total) by mouth daily. Patient taking differently: Take 10 mEq by mouth daily. Takes 10 meq potassium every other day with 10 mg torsemide Takes 20 meq potassium every other day with 20 mg torsemide 02/23/22   Weaver, Scott T, PA-C  rosuvastatin (CRESTOR) 10 MG tablet Take 1 tablet (10 mg total) by mouth every other day. 11/10/21   Joaquim Nam, MD  Spacer/Aero Chamber Mouthpiece MISC Use with  inhaler as needed.  J44.9 09/30/17   Joaquim Nam, MD  torsemide (DEMADEX) 20 MG tablet Take 20 mg by mouth daily. 05/21/22   [provider]    Physical Exam: Vitals:   06/29/22 1730 06/29/22 1731 06/29/22 1800 06/29/22 1830  BP: 132/64  (!) 110/48 (!) 109/49  Pulse: 75 72 76 71  Resp: Temp:      TempSrc:      SpO2: 100% 100% 100% 100%  Weight:      Height:       Physical Exam Vitals and nursing note reviewed.  Constitutional:      Appearance: She is obese.     Comments: Lethargic, but will wake up with light sternal rub and to voice.  HENT:     Head: Normocephalic and atraumatic.  Eyes:     Extraocular Movements: Extraocular movements intact.     Conjunctiva/sclera: Conjunctivae normal.  Cardiovascular:      Rate and Rhythm: Normal rate and regular rhythm.     Heart sounds: Murmur (Systolic murmur) heard.     Comments: 2+ pitting edema bilaterally up to the knee Pulmonary:     Effort: Pulmonary effort is normal.     Breath sounds: Decreased breath sounds (Diminished breath sounds throughout due to body habitus) and wheezing (Diffuse expiratory wheezing) present.  Abdominal:     General: Bowel sounds are normal.     Palpations: Abdomen is soft.  Skin:    General: Skin is warm and dry.  Neurological:     Comments:  Patient is currently on BiPAP, and unable to questions regarding orientation.  Follows simple commands when requested.  Able to move all extremities.    Data Reviewed: CBC with WBC of 12.3, hemoglobin 8.1, MCV of 101, and platelets of 136 CMP with sodium of 136, potassium 4.6, chloride 95, bicarb 30, glucose 130, BUN 33, creatinine 1.36, calcium 7.8, albumin 2.9, AST 16, ALT 16 GFR 30 BNP elevated to 45 Troponin negative at 17 Lactic acid within normal limits at 0.6 Urinalysis with moderate hemoglobin, large leukocytes, proteinuria, many  bacteria and over 50 WBC/hpf.  EKG personally reviewed.  Sinus rhythm with a rate of 81.  J-point elevation in the lateral leads, but inconsistent.  No ischemic changes noted  CT Head Wo Contrast  Result Date: 06/29/2022 CLINICAL DATA:  Mental status change, unknown cause EXAM: CT HEAD WITHOUT CONTRAST TECHNIQUE: Contiguous axial images were obtained from the base of the skull through the vertex without intravenous contrast. RADIATION DOSE REDUCTION: This exam was performed according to the departmental dose-optimization program which includes automated exposure control, adjustment of the mA and/or kV according to patient size and/or use of iterative reconstruction technique. COMPARISON:  CT head April 30, 2021. FINDINGS: Brain: No evidence of acute large vascular territory infarction, hemorrhage, hydrocephalus, extra-axial collection or mass  lesion/mass effect. Patchy white matter hypodensities, nonspecific but compatible with chronic microvascular ischemic disease. Vascular: No hyperdense vessel. Skull: No acute fracture. Sinuses/Orbits: Mild paranasal sinus mucosal thickening. No acute orbital findings. Other: No mastoid effusions. IMPRESSION: No evidence of acute intracranial abnormality. Electronically Signed   By: Feliberto Harts M.D.   On: 06/29/2022 15:35   DG Chest Port 1 View  Result Date: 06/29/2022 CLINICAL DATA:  Sepsis EXAM: PORTABLE CHEST 1 VIEW COMPARISON:  X-ray 02/24/2022 and older.  CT scan 03/19/2022 FINDINGS: Enlarged cardiopericardial silhouette with some vascular congestion. Calcified aorta. No consolidation, pneumothorax or effusion. No edema. Films are under penetrated IMPRESSION: Enlarged cardiopericardial silhouette with vascular congestion. Calcified and tortuous aorta Electronically Signed   By: Karen Kays M.D.   On: 06/29/2022 15:06    Results are pending, will review when available.  Assessment and Plan:  * Acute on chronic respiratory failure with hypoxia and hypercapnia Patient is presenting with increased lethargy over the past several days in addition to fever.  She was found to be obtunded at oncology office and was sent to the ED.  VBG at this time demonstrates pCO2 of 70, but however with compensation given pH remains 7.36.  Overall, likely multifactorial in the setting of COPD exacerbation and heart failure exacerbation.  - Continue BiPAP - Repeat VBG this evening - Transition to home supplemental oxygen when able - Management of COPD and HF as noted below  COPD with acute exacerbation Patient presenting with increased lethargy and hypercapnia.  Diffuse wheezing on examination.  Due to patient's altered mental status, unable to clarify if increased respiratory symptoms over the last several days.  - Solu-Medrol 125 mg once - Start prednisone 40 mg tomorrow to complete a 5-day course -  Continue Ceftriaxone and azithromycin - RVP and procalcitonin pending - DuoNebs every 6 hours - Continue home bronchodilators  Acute on chronic heart failure with preserved ejection fraction (HFpEF) Per patient's daughter, patient has been experiencing gradually worsening lower extremity edema despite oral Lasix.  BNP is elevated today at 245 compared to 88 three months prior.  Most recent echocardiogram in January 2024 with preserved EF of 55-60% and grade 1 diastolic dysfunction.  - Start Lasix 40 mg IV daily - Strict in and out - Daily weights  Acute encephalopathy Patient presenting with increased lethargy, most likely secondary to hypercapnia.  Patient's daughter was concerned for possible UTI.  Urinalysis results demonstrates leukocytes and bacteria, however unable to verify if patient is experiencing any urinary symptoms.  She is already receiving Rocephin for COPD exacerbation.  - Management of hypercapnia as noted above - Urine culture pending - Continue Rocephin  Type 2 diabetes mellitus with diabetic neuropathy, without long-term current use of insulin Patient is currently  managed on metformin only with last A1c of 5.3% approximately 3 months ago.  - SSI, sensitive - Hold home metformin  Symptomatic anemia Patient has a history of severe symptomatic anemia, currently iron transfusion and blood transfusion dependent.  Follows with oncology.  Hemoglobin is stable at this time.  - Serial CBC while admitted - Transfuse for hemoglobin less than 7  Essential hypertension - Holding home antihypertensives in the setting of borderline low blood pressures  Obstructive sleep apnea Per patient's daughter patient has a CPAP at home but has not yet had an at least 5 to 10 years.  - Encourage patient to use CPAP prior to discharge.  May ultimately benefit from BiPAP   Hypothyroidism - Restart home Synthroid when mentation improves  Advance Care Planning:   Code Status: Full  Code.  Per patient's daughter [name] at bedside, patient had a recent conversation with hospice and decided at that time she wanted full resuscitative efforts.  Patient is currently unable to make decisions, so decision deferred to patient's daughter.  Keenan Bachelor is the healthcare power of attorney.  She will bring the paperwork tomorrow.    Consults: None  Family Communication: Patient's daughter updated at bedside  Severity of Illness: The appropriate patient status for this patient is OBSERVATION. Observation status is judged to be reasonable and necessary in order to provide the required intensity of service to ensure the patient's safety. The patient's presenting symptoms, physical exam findings, and initial radiographic and laboratory data in the context of their medical condition is felt to place them at decreased risk for further clinical deterioration. Furthermore, it is anticipated that the patient will be medically stable for discharge from the hospital within 2 midnights of admission.   Author: Verdene Lennert, MD 06/29/2022 7:22 PM  For on call review [email or web address].

## 2022-06-29 NOTE — ED Notes (Signed)
Patient transported to CT 

## 2022-06-30 ENCOUNTER — Other Ambulatory Visit: Payer: Self-pay

## 2022-06-30 ENCOUNTER — Encounter: Payer: Self-pay | Admitting: Internal Medicine

## 2022-06-30 ENCOUNTER — Inpatient Hospital Stay (HOSPITAL_COMMUNITY)
Admit: 2022-06-30 | Discharge: 2022-06-30 | Disposition: A | Discharge status: Home / Self Care | Payer: Medicare Other | Attending: Internal Medicine | Admitting: Internal Medicine

## 2022-06-30 DIAGNOSIS — I4891 Unspecified atrial fibrillation: Secondary | ICD-10-CM

## 2022-06-30 DIAGNOSIS — N3000 Acute cystitis without hematuria: Secondary | ICD-10-CM | POA: Diagnosis not present

## 2022-06-30 DIAGNOSIS — I480 Paroxysmal atrial fibrillation: Secondary | ICD-10-CM | POA: Diagnosis not present

## 2022-06-30 DIAGNOSIS — J441 Chronic obstructive pulmonary disease with (acute) exacerbation: Secondary | ICD-10-CM | POA: Diagnosis not present

## 2022-06-30 DIAGNOSIS — I5032 Chronic diastolic (congestive) heart failure: Secondary | ICD-10-CM

## 2022-06-30 DIAGNOSIS — J9622 Acute and chronic respiratory failure with hypercapnia: Secondary | ICD-10-CM | POA: Diagnosis not present

## 2022-06-30 DIAGNOSIS — J9621 Acute and chronic respiratory failure with hypoxia: Secondary | ICD-10-CM | POA: Diagnosis not present

## 2022-06-30 LAB — RESPIRATORY PANEL BY PCR

## 2022-06-30 LAB — BLOOD GAS, VENOUS
Acid-Base Excess: 9.1 mmol/L — ABNORMAL HIGH (ref 0.0–2.0)
Bicarbonate: 36.3 mmol/L — ABNORMAL HIGH (ref 20.0–28.0)
Delivery systems: POSITIVE
FIO2: 40 %
O2 Saturation: 95.2 %
Patient temperature: 37
pCO2, Ven: 60 mmHg (ref 44–60)
pH, Ven: 7.39 (ref 7.25–7.43)
pO2, Ven: 65 mmHg — ABNORMAL HIGH (ref 32–45)

## 2022-06-30 LAB — CBC WITH DIFFERENTIAL/PLATELET
Abs Immature Granulocytes: 0.05 10*3/uL (ref 0.00–0.07)
Basophils Absolute: 0 10*3/uL (ref 0.0–0.1)
Basophils Relative: 0 %
Eosinophils Absolute: 0 10*3/uL (ref 0.0–0.5)
Eosinophils Relative: 0 %
HCT: 29.8 % — ABNORMAL LOW (ref 36.0–46.0)
Hemoglobin: 8.3 g/dL — ABNORMAL LOW (ref 12.0–15.0)
Immature Granulocytes: 1 %
Lymphocytes Relative: 1 %
Lymphs Abs: 0.1 10*3/uL — ABNORMAL LOW (ref 0.7–4.0)
MCH: 28.9 pg (ref 26.0–34.0)
MCHC: 27.9 g/dL — ABNORMAL LOW (ref 30.0–36.0)
MCV: 103.8 fL — ABNORMAL HIGH (ref 80.0–100.0)
Monocytes Absolute: 0.1 10*3/uL (ref 0.1–1.0)
Monocytes Relative: 1 %
Neutro Abs: 8.1 10*3/uL — ABNORMAL HIGH (ref 1.7–7.7)
Neutrophils Relative %: 97 %
Platelets: 131 10*3/uL — ABNORMAL LOW (ref 150–400)
RBC: 2.87 MIL/uL — ABNORMAL LOW (ref 3.87–5.11)
RDW: 17.6 % — ABNORMAL HIGH (ref 11.5–15.5)
WBC: 8.3 10*3/uL (ref 4.0–10.5)
nRBC: 0 % (ref 0.0–0.2)

## 2022-06-30 LAB — BASIC METABOLIC PANEL
Anion gap: 8 (ref 5–15)
BUN: 35 mg/dL — ABNORMAL HIGH (ref 8–23)
CO2: 34 mmol/L — ABNORMAL HIGH (ref 22–32)
Calcium: 8.2 mg/dL — ABNORMAL LOW (ref 8.9–10.3)
Chloride: 99 mmol/L (ref 98–111)
Creatinine, Ser: 1.33 mg/dL — ABNORMAL HIGH (ref 0.44–1.00)
GFR, Estimated: 39 mL/min — ABNORMAL LOW (ref >60)
Glucose, Bld: 195 mg/dL — ABNORMAL HIGH (ref 70–99)
Potassium: 4.7 mmol/L (ref 3.5–5.1)
Sodium: 141 mmol/L (ref 135–145)

## 2022-06-30 LAB — CBG MONITORING, ED
Glucose-Capillary: 150 mg/dL — ABNORMAL HIGH (ref 70–99)
Glucose-Capillary: 125 mg/dL — ABNORMAL HIGH (ref 70–99)

## 2022-06-30 LAB — MAGNESIUM: Magnesium: 2.2 mg/dL (ref 1.7–2.4)

## 2022-06-30 LAB — FOLATE: Folate: 12.1 ng/mL (ref >5.9)

## 2022-06-30 LAB — GLUCOSE, CAPILLARY: Glucose-Capillary: 224 mg/dL — ABNORMAL HIGH (ref 70–99)

## 2022-06-30 LAB — TSH: TSH: 0.899 u[IU]/mL (ref 0.350–4.500)

## 2022-06-30 LAB — ECHOCARDIOGRAM COMPLETE: Weight: 3689.62 oz

## 2022-06-30 LAB — CULTURE, BLOOD (ROUTINE X 2)

## 2022-06-30 LAB — VITAMIN D 25 HYDROXY (VIT D DEFICIENCY, FRACTURES): Vit D, 25-Hydroxy: 65.53 ng/mL (ref 30–100)

## 2022-06-30 MED ORDER — AMIODARONE HCL 200 MG PO TABS
200.0000 mg | ORAL_TABLET | Freq: Two times a day (BID) | ORAL | Status: DC
  Administered 2022-06-30 – 2022-07-07 (×15): 200 mg via ORAL
  Filled 2022-06-30 (×15): qty 1

## 2022-06-30 MED ORDER — IPRATROPIUM-ALBUTEROL 0.5-2.5 (3) MG/3ML IN SOLN: 3.0000 mL | Freq: Four times a day (QID) | RESPIRATORY_TRACT | Status: DC

## 2022-06-30 MED ORDER — AMIODARONE LOAD VIA INFUSION
150.0000 mg | Freq: Once | INTRAVENOUS | Status: AC
  Administered 2022-06-30: 150 mg via INTRAVENOUS
  Filled 2022-06-30: qty 83.34

## 2022-06-30 MED ORDER — METOPROLOL TARTRATE 5 MG/5ML IV SOLN: 5.0000 mg | Freq: Once | INTRAVENOUS | Status: DC | PRN

## 2022-06-30 MED ORDER — AMIODARONE HCL IN DEXTROSE 360-4.14 MG/200ML-% IV SOLN
30.0000 mg/h | INTRAVENOUS | Status: DC
  Filled 2022-06-30 (×3): qty 200

## 2022-06-30 MED ORDER — METOPROLOL TARTRATE 5 MG/5ML IV SOLN
5.0000 mg | Freq: Once | INTRAVENOUS | Status: AC
  Administered 2022-06-30: 5 mg via INTRAVENOUS
  Filled 2022-06-30: qty 5

## 2022-06-30 MED ORDER — IPRATROPIUM-ALBUTEROL 0.5-2.5 (3) MG/3ML IN SOLN: 3.0000 mL | Freq: Two times a day (BID) | RESPIRATORY_TRACT | Status: DC

## 2022-06-30 MED ORDER — IPRATROPIUM-ALBUTEROL 0.5-2.5 (3) MG/3ML IN SOLN
3.0000 mL | Freq: Two times a day (BID) | RESPIRATORY_TRACT | Status: DC
  Administered 2022-07-01 – 2022-07-07 (×13): 3 mL via RESPIRATORY_TRACT
  Filled 2022-06-30 (×14): qty 3

## 2022-06-30 MED ORDER — IPRATROPIUM-ALBUTEROL 0.5-2.5 (3) MG/3ML IN SOLN: 3.0000 mL | Freq: Three times a day (TID) | RESPIRATORY_TRACT | Status: DC

## 2022-06-30 MED ORDER — PERFLUTREN LIPID MICROSPHERE
1.0000 mL | INTRAVENOUS | Status: AC | PRN
  Administered 2022-06-30: 2 mL via INTRAVENOUS

## 2022-06-30 MED ORDER — ARFORMOTEROL TARTRATE 15 MCG/2ML IN NEBU
15.0000 ug | INHALATION_SOLUTION | Freq: Two times a day (BID) | RESPIRATORY_TRACT | Status: DC
  Administered 2022-06-30 – 2022-07-07 (×14): 15 ug via RESPIRATORY_TRACT
  Filled 2022-06-30 (×16): qty 2

## 2022-06-30 MED ORDER — AMIODARONE HCL IN DEXTROSE 360-4.14 MG/200ML-% IV SOLN
60.0000 mg/h | INTRAVENOUS | Status: AC
  Administered 2022-06-30: 60 mg/h via INTRAVENOUS
  Filled 2022-06-30: qty 200

## 2022-06-30 MED ORDER — SODIUM CHLORIDE 0.9 % IV SOLN
1.0000 g | INTRAVENOUS | Status: DC
  Administered 2022-06-30 – 2022-07-01 (×2): 1 g via INTRAVENOUS
  Filled 2022-06-30: qty 10
  Filled 2022-06-30: qty 1

## 2022-06-30 MED ORDER — METOPROLOL TARTRATE 5 MG/5ML IV SOLN: 5.0000 mg | Freq: Once | INTRAVENOUS | Status: DC

## 2022-06-30 MED ORDER — METOPROLOL TARTRATE 5 MG/5ML IV SOLN
INTRAVENOUS | Status: AC
  Administered 2022-06-30: 5 mg
  Filled 2022-06-30: qty 5

## 2022-06-30 NOTE — ED Notes (Signed)
RT at bedside to trial pt off of bipap and place pt on 4LNC.

## 2022-06-30 NOTE — Hospital Course (Signed)
6.4/22.9 - 4/17 7.7/26.7, wbc 12.5 Hstrop 17  IMPRESSION: Enlarged cardiopericardial silhouette with vascular congestion. Calcified and tortuous aorta

## 2022-06-30 NOTE — Consult Note (Signed)
Cardiology Consult    Patient ID: Alexandria [name] MRN: [id number], DOB/AGE: 1935/10/11   Admit date: 06/29/2022 Date of Consult: 06/30/2022  Primary Physician: Joaquim Nam, MD Primary Cardiologist: Julien Nordmann, MD Requesting Provider: Dannial Monarch, MD  Patient Profile    Alexandria [name] is a 87 y.o. female with a history of chronic HFpEF, coronary Ca2+, lymphedema, HTN, HL, COPD on home O2, DMII, CKD III, mild AS, morbid obesity, IDA req IV iron and PRBCs, OSA, severe deconditioning, RUL lung cancer, and depression, who is being seen today for the evaluation of Afib w/ RVR in the setting of UTI and AMS at the request of Dr. Lucianne Muss.  Past Medical History   Past Medical History:  Diagnosis Date   Allergy, unspecified not elsewhere classified    Anxiety    Aortic stenosis, mild 03/30/2022   a. TTE 03/29/2022: EF 55-60, no RWMA, GR 1 DD, normal RVSF, mild aortic stenosis (mean 10, V-max 212 cm/s, DI 0.70)   Cataract    Chronic diastolic congestive heart failure    COPD (chronic obstructive pulmonary disease) 03/08/1998   PFTs 12/12/2002 FEV 1 1.42 (64%) ratio 58 with no better after B2 and DLCO75%; PFTs 11/20/09 FEV1 1.50 (73%) ratio 50 no better after B2 with DLCO 62%; Hfa 50% 11/20/2009 >75%, 01/13/10 p coaching   Coronary artery calcification seen on CT scan    a. 2012 Neg stress test.   Depression 12/07/1998   Diabetes mellitus type II 02/05/2006   Dr. Elvera Lennox with endo   Disorders of bursae and tendons in shoulder region, unspecified    Rotator cuff syndrome, right   E. coli bacteremia    Esophagitis    GERD (gastroesophageal reflux disease)    History of UTI    Hyperlipidemia 10/04/2000   Hypertension 03/08/1992   Hypothyroidism 03/08/1968   Iron deficiency anemia    Lung nodule    radiation starts 10-08-2019   Malignant neoplasm of right upper lobe of lung 07/24/2019   Obesity    NOS   OSA (obstructive sleep apnea)    PSG 01/27/10 AHI 13, pt does not know CPAP settings    Peripheral neuropathy    Likely due to DM per Dr. Tresa Endo hernia     Past Surgical History:  Procedure Laterality Date   ABD U/S  03/19/1999   Nml x2 foci in liver   ADENOSINE MYOVIEW  06/02/2007   Nml   CARDIOLITE PERSANTINE  08/24/2000   Nml   CAROTID U/S  08/24/2000   1-39% ICA stenosis   CAROTID U/S  06/02/2007   No apprec change    CARPAL TUNNEL RELEASE  12/1997   Right   CESAREAN SECTION     x2 Breech/ repeat   CHOLECYSTECTOMY  1997   COLONOSCOPY WITH PROPOFOL N/A 02/12/2016   Procedure: COLONOSCOPY WITH PROPOFOL;  Surgeon: Rachael Fee, MD;  Location: [facility];  Service: Endoscopy;  Laterality: N/A;   CT ABD W & PELVIS WO/W CM     Abd hemangiomas of liver, 1 cm R renal cyst   DENTAL SURGERY  2016   Implants   DEXA  07/03/2003   Nml   ESOPHAGOGASTRODUODENOSCOPY  12/05/1997   Nml (due to hoarseness)   ESOPHAGOGASTRODUODENOSCOPY (EGD) WITH PROPOFOL N/A 02/12/2016   Procedure: ESOPHAGOGASTRODUODENOSCOPY (EGD) WITH PROPOFOL;  Surgeon: Rachael Fee, MD;  Location: [facility];  Service: Endoscopy;  Laterality: N/A;   GALLBLADDER SURGERY     HERNIA REPAIR  01/24/2009  Lap Ventr w/ Lysis of adhesions (Dr. Dwain Sarna)   knee arthroscopic surgery  years ago   right   ROTATOR CUFF REPAIR  1984   Right, [place]   SHOULDER OPEN ROTATOR CUFF REPAIR  02/08/2012   Procedure: ROTATOR CUFF REPAIR SHOULDER OPEN;  Surgeon: Drucilla Schmidt, MD;  Location: [facility];  Service: Orthopedics;  Laterality: Left;  Left Shoulder Open Anterior Acrominectomy Rotator Cuff Repair Open Distal Clavicle Resection ,tissue mend graft, and repair of biceps tendon   THUMB RELEASE  12/1997   Right   TONSILLECTOMY     TOTAL ABDOMINAL HYSTERECTOMY  1985   Due to dysmennorhea   US ECHOCARDIOGRAPHY  06/02/2007     Allergies  Allergies  Allergen Reactions   Antihistamines, Diphenhydramine-Type Other (See Comments)    Able to tolerate only allegra.    Sulfonamide Derivatives  Swelling    REACTION: closed throat   Incruse Ellipta [Umeclidinium Bromide] Cough    Caused voice change and severe coughing   Clarithromycin Other (See Comments)    REACTION: diff swallowing and mouth blisters   Codeine Other (See Comments)    Unknown    Lyrica [Pregabalin] Other (See Comments)    Lack of effect for neuropathy pain.     Spiriva Handihaler [Tiotropium Bromide Monohydrate] Other (See Comments)    Voice changes   Vraylar [Cariprazine]     intolerant    History of Present Illness    87 y.o. female with a history of chronic HFpEF, coronary Ca2+, lymphedema, HTN, HL, COPD on home O2, DMII, CKD III, mild AS, morbid obesity, IDA req IV iron and PRBCs, OSA, severe deconditioning, RUL lung cancer, and depression.  Pt lives locally and per caregiver, who is present today, she is chronically debilitated and activity is now very limited. She requires assistance for all transfers and does not walk.  Caregiver reports that pt often grabs her chest or winces to suggest that she is having chest pain.  She has chronic DOE and uses O2 @ home.  She is followed by oncology in the setting of iron deficiency anemia w/ weekly IV iron infusions.  Last week, she was noted to be more anemic w/ H/H o f6.4/22.9 on 4/17, and she received 2 units of PRBCs on 4/19.  Over the weekend, she was noted to be febrile on 4/21.  She apparently also had chills, as caregiver reports family was using a warming blanket on her.  On 4/23, caregiver noted that pt was more lethargic and sleepy.  Pt required more assistance w/ breakfast than usual.  Caregiver got her ready and took her to oncology appt, where Alexandria [name] was noted to be more unresponsive w/ new leukocytosis. She was transferred to the ED for further eval.  Here, she was afebrile and normotensive.  H/H 8.1/28.4, WBC 12.3, renal fxn stable @ 1.36.  BNP mildly elevated @ 245.1.  Trop nl @ 17.  pH 7.36, though pCO2 70 w/ bicarb 39.5.  She was placed on BiPAP.   Urine suggestive of UTI and rocephin started.  Overnight, per notes, pt removed BiPAP shortly after 2 am w/ assoc hypoxia.  She then developed Afib w/ RVR in the 140's.  IV amio was initiated and she subsequently converted to sinus rhythm ~ 4:22 AM.  Amio was d/c'd due to bradycardia.  She has since maintained sinus rhythm w/ freq PVCs.  Caregiver @ bedside.  Case also discussed w/ dtr via phone.  Pt remains on BiPAP.  She opens  her eyes to tactile stimuli and is currently able to answer yes/no questions.  No complaints at this time.  Inpatient Medications     arformoterol  15 mcg Nebulization BID   budesonide  0.5 mg Nebulization BID   enoxaparin (LOVENOX) injection  0.5 mg/kg Subcutaneous Q24H   ezetimibe  10 mg Oral QHS   FLUoxetine  20 mg Oral BID   furosemide  40 mg Intravenous Daily   insulin aspart  0-9 Units Subcutaneous TID WC   ipratropium-albuterol  3 mL Nebulization BID   levothyroxine  125 mcg Oral Q0600   predniSONE  40 mg Oral Q breakfast   sodium chloride flush  3 mL Intravenous Q12H    Family History    Family History  Problem Relation Age of Onset   Heart disease Mother    Thyroid disease Mother    Emphysema Father        One lung   Cystic fibrosis Sister    Hyperthyroidism Sister    Osteoarthritis Brother    Hyperthyroidism Brother    Esophageal cancer Neg Hx    Cancer Neg Hx        Head or neck   Colon cancer Neg Hx    Stomach cancer Neg Hx    Breast cancer Neg Hx    She indicated that her mother is deceased. She indicated that her father is deceased. She indicated that her sister is alive. She indicated that all of her three brothers are alive. She indicated that the status of her neg hx is unknown.   Social History    Social History   Socioeconomic History   Marital status: Widowed    Spouse name: Not on file   Number of children: 2   Years of education: Not on file   Highest education level: Not on file  Occupational History   Occupation:  Retired    Associate Professor: RETIRED  Tobacco Use   Smoking status: Former    Packs/day: 2.00    Years: 50.00    Additional pack years: 0.00    Total pack years: 100.00    Types: Cigarettes    Quit date: 02/05/2005    Years since quitting: 17.4   Smokeless tobacco: Never  Vaping Use   Vaping Use: Never used  Substance and Sexual Activity   Alcohol use: Yes    Comment: occasionally   Drug use: Never   Sexual activity: Not Currently  Other Topics Concern   Not on file  Social History Narrative   Widowed 2023, 2 children; Enjoys painting      Quit smoking in 2007; no alcohol; lives in [place]; [name]/d lives in [place]. Had own business. Dont drive- sec to neuropathy.    Social Determinants of Health   Financial Resource Strain: Not on file  Food Insecurity: No Food Insecurity (06/30/2022)   Hunger Vital Sign    Worried About Running Out of Food in the Last Year: Never true    Ran Out of Food in the Last Year: Never true  Transportation Needs: No Transportation Needs (06/30/2022)   PRAPARE - Administrator, Civil Service (Medical): No    Lack of Transportation (Non-Medical): No  Physical Activity: Not on file  Stress: Not on file  Social Connections: Not on file  Intimate Partner Violence: Not At Risk (06/30/2022)   Humiliation, Afraid, Rape, and Kick questionnaire    Fear of Current or Ex-Partner: No    Emotionally Abused: No  Physically Abused: No    Sexually Abused: No     Review of Systems    Secondary to altered mental status, patient unable to provide complete review of systems at this time.  Per caregiver, she was very sleepy on the morning of presentation.  She frequently complains of chest pain.  Mobility very limited at baseline.  She frequently has at least mild lower extremity edema.  Unclear history of palpitations. All other systems reviewed and are otherwise negative except as noted above.  Physical Exam    Blood pressure (!) 144/60, pulse 77,  temperature (!) 97.4 F (36.3 C), temperature source Axillary, resp. rate 19, height 5\' 5"  (1.651 m), weight 104.6 kg, SpO2 100 %.  General: Minimally responsive, on BiPAP Psych: Minimally responsive-able to respond to yes/no questions. Neuro: Minimally responsive.  Unable to determine orientation.  HEENT: Normal  Neck: Supple, obese, difficult to gauge JVP.  Unable to assess for bruits at this time as she is currently wearing BiPAP. Lungs:  Resp regular and unlabored, diminished breath sounds bilaterally. Heart: RRR no s3, s4, or murmurs.  Distant heart sounds. Abdomen: Obese, soft, non-tender, non-distended, BS + x 4.  Extremities: No clubbing, cyanosis or 1+ bilateral lower extremity edema. DP/PT2+, Radials 2+ and equal bilaterally.  Labs    Cardiac Enzymes Recent Labs  Lab 06/29/22 1519  TROPONINIHS 17     BNP    Component Value Date/Time   BNP 245.1 (H) 06/29/2022 1437    ProBNP    Component Value Date/Time   PROBNP 69.0 04/22/2022 1624    Lab Results  Component Value Date   WBC 8.3 06/30/2022   HGB 8.3 (L) 06/30/2022   HCT 29.8 (L) 06/30/2022   MCV 103.8 (H) 06/30/2022   PLT 131 (L) 06/30/2022    Recent Labs  Lab 06/29/22 1437 06/30/22 0631  NA 136 141  K 4.6 4.7  CL 95* 99  CO2 38* 34*  BUN 33* 35*  CREATININE 1.36* 1.33*  CALCIUM 7.8* 8.2*  PROT 6.3*  --   BILITOT 0.8  --   ALKPHOS 100  --   ALT 16  --   AST 16  --   GLUCOSE 130* 195*   Lab Results  Component Value Date   CHOL 140 08/06/2021   HDL 57.80 08/06/2021   LDLCALC 62 08/06/2021   TRIG 99.0 08/06/2021     Radiology Studies    CT Head Wo Contrast  Result Date: 06/29/2022 CLINICAL DATA:  Mental status change, unknown cause EXAM: CT HEAD WITHOUT CONTRAST TECHNIQUE: Contiguous axial images were obtained from the base of the skull through the vertex without intravenous contrast. RADIATION DOSE REDUCTION: This exam was performed according to the departmental dose-optimization program  which includes automated exposure control, adjustment of the mA and/or kV according to patient size and/or use of iterative reconstruction technique. COMPARISON:  CT head April 30, 2021. FINDINGS: Brain: No evidence of acute large vascular territory infarction, hemorrhage, hydrocephalus, extra-axial collection or mass lesion/mass effect. Patchy white matter hypodensities, nonspecific but compatible with chronic microvascular ischemic disease. Vascular: No hyperdense vessel. Skull: No acute fracture. Sinuses/Orbits: Mild paranasal sinus mucosal thickening. No acute orbital findings. Other: No mastoid effusions. IMPRESSION: No evidence of acute intracranial abnormality. Electronically Signed   By: Feliberto Harts M.D.   On: 06/29/2022 15:35   DG Chest Port 1 View  Result Date: 06/29/2022 CLINICAL DATA:  Sepsis EXAM: PORTABLE CHEST 1 VIEW COMPARISON:  X-ray 02/24/2022 and older.  CT scan 03/19/2022  FINDINGS: Enlarged cardiopericardial silhouette with some vascular congestion. Calcified aorta. No consolidation, pneumothorax or effusion. No edema. Films are under penetrated IMPRESSION: Enlarged cardiopericardial silhouette with vascular congestion. Calcified and tortuous aorta Electronically Signed   By: Karen Kays M.D.   On: 06/29/2022 15:06    ECG & Cardiac Imaging    Presenting ECG on April 23-regular sinus rhythm, 81, PACs, poor R wave progression- personally reviewed.  April 24 2:44 AM ECG: Atrial fibrillation, 142, no acute ST or T changes-personally reviewed  April 24, 4:50 AM ECG: Regular sinus rhythm, 62, PVC, no acute ST or T changes-personally reviewed  Assessment & Plan    1.  Afib w/ RVR: Patient without prior atrial fibrillation, presented to the emergency department on April 23 secondary to altered mental status.  She was diagnosed with urinary tract infection and in the setting of a component of respiratory failure, she was placed on BiPAP.  Nursing notes indicate that overnight,  she removed her BiPAP and became hypoxic.  She subsequently developed A-fib with RVR, confirmed by ECG at 2:44 AM.  She was given amiodarone bolus and placed on infusion with subsequent conversion to sinus rhythm by follow-up ECG at 4:50 AM.  Currently maintaining sinus with occasional PVCs.  Amiodarone was discontinued due to bradycardia.  Patient remains minimally responsive, answers only to yes or no questions at the time of our interview.  CHA2DS2-VASc equals 7.  Though current episode of atrial fibrillation was short-lived, suspect she may have occasional paroxysms at home, has family member and caregiver to report intermittent episodes of chest pain.  Will plan on outpatient event monitoring to assess for further episodes of atrial fibrillation.  Decision regarding anticoagulation is complicated by iron deficiency anemia/chronic blood loss requiring transfusions and IV iron.  Not currently on AV nodal blocking agent.  Likely a poor candidate for beta-blocker in the setting of chronic respiratory failure on home O2.  Historically normal LV function.  May tolerate low-dose diltiazem, but became bradycardic earlier after receiving a total of 10 mg of IV metoprolol, amiodarone bolus, and infusion.  2.  UTI: Antibiotic therapy per medicine team.  3.  Chronic HFpEF: Heart rate and blood pressure currently stable.  Chest x-ray suggestive of vascular congestion and BNP mildly elevated at 245.1.  She is currently on Lasix 40 mg IV daily, which is reasonable.  Suspect we will be able to put her back on her home dose of torsemide within the next 24 hours.  4.  Essential HTN: Stable.  5.  CKD III: Creatinine stable.  Follow with diuresis.  6.  Iron deficiency anemia: On weekly iron infusions with recent 2 unit transfusion last week.  Follow-up with hematology as an outpatient.  H&H relatively stable-follow-up.  7.  COPD: On home O2 and currently on BiPAP.  Per medicine team.  8.  Type 2 diabetes mellitus:  Per medicine team.  Risk Assessment/Risk Scores:          CHA2DS2-VASc Score = 7   This indicates a 11.2% annual risk of stroke. The patient's score is based upon: CHF History: 1 HTN History: 1 Diabetes History: 1 Stroke History: 0 Vascular Disease History: 1 Age Score: 2 Gender Score: 1     Signed, Nicolasa Ducking, NP 06/30/2022, 1:13 PM  For questions or updates, please contact   Please consult [email or web address] for contact info under Cardiology/STEMI.

## 2022-06-30 NOTE — ED Notes (Signed)
Patient's heart rhythm went into afib in the 130's-140's. Notified Bishop Limbo, NP

## 2022-06-30 NOTE — ED Notes (Signed)
Pt off bipap on 3LNC. Pt awake and having full conversations. Pt able to drink water appropriately.

## 2022-06-30 NOTE — Progress Notes (Signed)
[facility] Progress Note  Patient: Alexandria [name]    OZH:[id number]  DOA: 06/29/2022     Date of Service: the patient was seen and examined on 06/30/2022  Chief Complaint  Patient presents with   Altered Mental Status   Brief hospital course: SHAQUETA CASADY is a 87 y.o. female with medical history significant of COPD with chronic hypoxic respiratory failure on 2.5 L of supplemental oxygen, iron deficiency anemia on chronic transfusions, HFpEF, type 2 diabetes, hypertension, hyperlipidemia, hypothyroidism, OSA, mild aortic stenosis, who presents to the ED due to altered mental status. History obtained from patient's daughter at bedside, [name], due to patient's altered mental status.  Approximately 10 days ago, patient's lethargy and fatigue have become significantly worse.  [name] states that patient would regularly fall asleep throughout the day.  In addition, patient felt generally weak.  They believe that it may be due to her anemia and had a follow-up with her oncologist.  She had a iron transfusion on 4/17 and blood transfusion on 4/18.  Despite this, lethargy continued to worsen.  In addition, patient had a fever on 4/21 in 4/22.  They went to an oncology follow-up today and was sent to the ED.    ED workup: Hypoxic respiratory failure, O2 sats dropped to 73%, patient was placed on oxygen and BiPAP in the ED Leukocytosis, WBC count elevated CT head negative for any acute findings, CXR demonstrated cardiomegaly with vascular congestion.  [facility] hospitalist consulted for admission and further management as below.  Assessment and Plan:  Metabolic encephalopathy most likely due to hypercapnic and hypoxic respiratory failure Continue supportive care and management as below.  Acute hypoxic and hypercapnic respiratory failure most likely COPD exacerbation and OSA, hypoventilation syndrome due to obesity ABG reviewed, improved after BiPAP S/p BiPAP overnight, use BiPAP as needed S/p  Solu-Medrol 125 mg x 1 dose given, started prednisone 40 mg p.o. daily for 4 days Resumed Brovana nebulizer twice daily, continue Pulmicort nebulizer twice daily,  DuoNeb every 6 hourly scheduled, changed to as needed after improvement Continue doxycycline 100 mg IV twice daily prophylaxis for atypical infection  Transient paroxysmal A-fib with RVR, resolved, converted back to normal sinus rhythm S/p amnio bolus was given, IV infusion was discontinued. S/p Lopressor 5 mg IV given Started amiodarone 20 mg p.o. twice daily Continue to monitor on telemetry,  Cardiology was consulted, follow for DOAC  Chronic diastolic heart failure BNP 245, slightly elevated Continue Lasix 40 mg IV daily Held torsemide 20 mg home dose for now Monitor intake output and renal functions  UTI, UA positive Continue ceftriaxone 1 g IV daily Follow urine culture  NIDDM T2, held home medications for now Continue NovoLog sliding scale, monitor CBG,  Depression, continue Prozac Hypothyroid, continue Synthroid   Body mass index is 38.37 kg/m.  Interventions:       Diet: Heart healthy diet/carb modified diet/fluid restriction 1.5 to 2/day DVT Prophylaxis: Subcutaneous Lovenox   Advance goals of care discussion: Full code  Family Communication: family was not present at bedside, at the time of interview.  The pt provided permission to discuss medical plan with the family. Opportunity was given to ask question and all questions were answered satisfactorily.  4/24, discussed with patient's daughter over the phone  Disposition:  Pt is from Home, admitted with Resp failure, A.fib and UTI, still has Resp Failure on IV abx , which precludes a safe discharge. Discharge to Home, when clinically stable, may need few days to stay in  the hospital..  Subjective: Overnight patient developed A-fib with RVR, required IV Lopressor and amiodarone, currently heart rate is well-controlled.  Patient is AOx3, patient was  on a BiPAP in the ED, seems improving.  Denies any complaints other than dry mouth. Management plan discussed with patient's daughter over the phone.  Physical Exam: General: Mild respiratory distress, laying comfortably, wearing BiPAP. Appear in no distress, affect appropriate Eyes: PERRLA ENT: Oral Mucosa Clear, dry Neck: no JVD,  Cardiovascular: S1 and S2 Present, no Murmur,  Respiratory: Equal air entry bilaterally, mild crackles, no significant wheezing appreciated. Abdomen: Bowel Sound present, Soft and no tenderness,  Skin: no rashes Extremities: no Pedal edema, no calf tenderness Neurologic: without any new focal findings Gait not checked due to patient safety concerns  Vitals:   06/30/22 0945 06/30/22 1115 06/30/22 1128 06/30/22 1300  BP: (!) 144/60   (!) 118/55  Pulse: 77   71  Resp: 19   18  Temp: (!) 97.4 F (36.3 C)   97.6 F (36.4 C)  TempSrc: Axillary   Oral  SpO2: 100% 97% 100% 99%  Weight:      Height:        Intake/Output Summary (Last 24 hours) at 06/30/2022 1402 Last data filed at 06/30/2022 1159 Gross per 24 hour  Intake --  Output 1500 ml  Net -1500 ml   Filed Weights   06/29/22 1524  Weight: 104.6 kg    Data Reviewed: I have personally reviewed and interpreted daily labs, tele strips, imagings as discussed above. I reviewed all nursing notes, pharmacy notes, vitals, pertinent old records I have discussed plan of care as described above with RN and patient/family.  CBC: Recent Labs  Lab 06/29/22 1302 06/29/22 1437 06/30/22 0631  WBC 12.5* 12.3* 8.3  NEUTROABS 11.3* 11.0* 8.1*  HGB 7.7* 8.1* 8.3*  HCT 26.7* 28.4* 29.8*  MCV 100.4* 101.1* 103.8*  PLT 125* 136* 131*   Basic Metabolic Panel: Recent Labs  Lab 06/29/22 1302 06/29/22 1437 06/30/22 0631  NA 134* 136 141  K 4.6 4.6 4.7  CL 94* 95* 99  CO2 33* 38* 34*  GLUCOSE 174* 130* 195*  BUN 33* 33* 35*  CREATININE 1.38* 1.36* 1.33*  CALCIUM 7.8* 7.8* 8.2*  MG  --   --  2.2     Studies: CT Head Wo Contrast  Result Date: 06/29/2022 CLINICAL DATA:  Mental status change, unknown cause EXAM: CT HEAD WITHOUT CONTRAST TECHNIQUE: Contiguous axial images were obtained from the base of the skull through the vertex without intravenous contrast. RADIATION DOSE REDUCTION: This exam was performed according to the departmental dose-optimization program which includes automated exposure control, adjustment of the mA and/or kV according to patient size and/or use of iterative reconstruction technique. COMPARISON:  CT head April 30, 2021. FINDINGS: Brain: No evidence of acute large vascular territory infarction, hemorrhage, hydrocephalus, extra-axial collection or mass lesion/mass effect. Patchy white matter hypodensities, nonspecific but compatible with chronic microvascular ischemic disease. Vascular: No hyperdense vessel. Skull: No acute fracture. Sinuses/Orbits: Mild paranasal sinus mucosal thickening. No acute orbital findings. Other: No mastoid effusions. IMPRESSION: No evidence of acute intracranial abnormality. Electronically Signed   By: Feliberto Harts M.D.   On: 06/29/2022 15:35   DG Chest Port 1 View  Result Date: 06/29/2022 CLINICAL DATA:  Sepsis EXAM: PORTABLE CHEST 1 VIEW COMPARISON:  X-ray 02/24/2022 and older.  CT scan 03/19/2022 FINDINGS: Enlarged cardiopericardial silhouette with some vascular congestion. Calcified aorta. No consolidation, pneumothorax or effusion. No edema. Films  are under penetrated IMPRESSION: Enlarged cardiopericardial silhouette with vascular congestion. Calcified and tortuous aorta Electronically Signed   By: Karen Kays M.D.   On: 06/29/2022 15:06    Scheduled Meds:  arformoterol  15 mcg Nebulization BID   budesonide  0.5 mg Nebulization BID   enoxaparin (LOVENOX) injection  0.5 mg/kg Subcutaneous Q24H   ezetimibe  10 mg Oral QHS   FLUoxetine  20 mg Oral BID   furosemide  40 mg Intravenous Daily   insulin aspart  0-9 Units Subcutaneous  TID WC   ipratropium-albuterol  3 mL Nebulization BID   levothyroxine  125 mcg Oral Q0600   predniSONE  40 mg Oral Q breakfast   sodium chloride flush  3 mL Intravenous Q12H   Continuous Infusions:  amiodarone Stopped (06/30/22 0941)   cefTRIAXone (ROCEPHIN)  IV     doxycycline (VIBRAMYCIN) IV Stopped (06/30/22 1146)   PRN Meds: acetaminophen **OR** acetaminophen, metoprolol tartrate, ondansetron **OR** ondansetron (ZOFRAN) IV, polyethylene glycol  Time spent: 55 minutes  Author: Gillis Santa. MD [facility] 06/30/2022 2:02 PM  To reach On-call, see care teams to locate the attending and reach out to them via [email or web address]. If 7PM-7AM, please contact night-coverage If you still have difficulty reaching the attending provider, please page the [facility] (Director on Call) for [facility] on amion for assistance.

## 2022-06-30 NOTE — Progress Notes (Signed)
       CROSS COVER NOTE  NAME: Alexandria [name] MRN: [id number] DOB : 1935/05/25 ATTENDING PHYSICIAN: Verdene Lennert, MD    Date of Service   06/30/2022   HPI/Events of Note   87 y.o. female with medical history significant of COPD with chronic hypoxic respiratory failure on 2.5 L of supplemental oxygen, iron deficiency anemia on chronic transfusions, HFpEF, type 2 diabetes, hypertension, hyperlipidemia, hypothyroidism, OSA, mild aortic stenosis, who presents to the ED due to altered mental status. Being treated for acute on chronic respiratory failure, copd exacerbation, and acute on chronic heart failure.   She went into new onset AFIB w RVR overnight. Per nursing the patient removed bipap, had a desaturation event and then HR increased to 140s. Not responsive to IV metoprolol. Amiodarone was started and patient converted to NSR shortly after amio bolus. HR then was a low as 58 and Amio drip stopped.   Interventions   Assessment/Plan:  Atrial Fibrillation with Rapid Ventricular Response, HR 130s-140s IV metoprolol --> HR remained in 130s IV Amiodarone CHA2DS2-VASc 6 (Age, Sex, CHF, HTN, Diabetes). Patient not anticoagulated as she converted back to sinus shortly after Amiodarone bolus complete.  Repeat ECHO Cardiology consult --> [facility] visit 05/07/22   Case discussed with Dr Arville Care, at request of daughter [name], who is in agreement with plan as above. Specifically IV Amiodarone.      To reach the provider On-Call:   7AM- 7PM see care teams to locate the attending and reach out to them via [email or web address]. Password: [email or web address] 7PM-7AM contact night-coverage If you still have difficulty reaching the appropriate provider, please page the [facility] (Director on Call) for [facility] on amion for assistance  This document was prepared using Conservation officer, historic buildings and may include unintentional dictation errors.  Bishop Limbo DNP, MBA, FNP-BC, PMHNP-BC Nurse Practitioner [facility] Pager [phone]

## 2022-06-30 NOTE — ED Notes (Signed)
RN messaged MD Lucianne Muss to clarify amiodarone follow up infusion. Pt HR been maintaining appropriate rate/rhythm since 0445. Per MD Lucianne Muss hold infusion.

## 2022-06-30 NOTE — Plan of Care (Signed)
  Problem: Education: Goal: Ability to demonstrate management of disease process will improve Outcome: Progressing Goal: Ability to verbalize understanding of medication therapies will improve Outcome: Progressing Goal: Individualized Educational Video(s) Outcome: Progressing   Problem: Activity: Goal: Capacity to carry out activities will improve Outcome: Progressing   Problem: Cardiac: Goal: Ability to achieve and maintain adequate cardiopulmonary perfusion will improve Outcome: Progressing   Problem: Education: Goal: Knowledge of disease or condition will improve Outcome: Progressing Goal: Knowledge of the prescribed therapeutic regimen will improve Outcome: Progressing Goal: Individualized Educational Video(s) Outcome: Progressing   Problem: Activity: Goal: Ability to tolerate increased activity will improve Outcome: Progressing Goal: Will verbalize the importance of balancing activity with adequate rest periods Outcome: Progressing   Problem: Respiratory: Goal: Ability to maintain a clear airway will improve Outcome: Progressing Goal: Levels of oxygenation will improve Outcome: Progressing Goal: Ability to maintain adequate ventilation will improve Outcome: Progressing   Problem: Education: Goal: Ability to describe self-care measures that may prevent or decrease complications (Diabetes Survival Skills Education) will improve Outcome: Progressing Goal: Individualized Educational Video(s) Outcome: Progressing   Problem: Coping: Goal: Ability to adjust to condition or change in health will improve Outcome: Progressing   Problem: Fluid Volume: Goal: Ability to maintain a balanced intake and output will improve Outcome: Progressing   Problem: Health Behavior/Discharge Planning: Goal: Ability to identify and utilize available resources and services will improve Outcome: Progressing Goal: Ability to manage health-related needs will improve Outcome: Progressing    Problem: Metabolic: Goal: Ability to maintain appropriate glucose levels will improve Outcome: Progressing   Problem: Nutritional: Goal: Maintenance of adequate nutrition will improve Outcome: Progressing Goal: Progress toward achieving an optimal weight will improve Outcome: Progressing   Problem: Skin Integrity: Goal: Risk for impaired skin integrity will decrease Outcome: Progressing   Problem: Tissue Perfusion: Goal: Adequacy of tissue perfusion will improve Outcome: Progressing   Problem: Education: Goal: Knowledge of General Education information will improve Description: Including pain rating scale, medication(s)/side effects and non-pharmacologic comfort measures Outcome: Progressing   Problem: Health Behavior/Discharge Planning: Goal: Ability to manage health-related needs will improve Outcome: Progressing   Problem: Clinical Measurements: Goal: Ability to maintain clinical measurements within normal limits will improve Outcome: Progressing Goal: Will remain free from infection Outcome: Progressing Goal: Diagnostic test results will improve Outcome: Progressing Goal: Respiratory complications will improve Outcome: Progressing Goal: Cardiovascular complication will be avoided Outcome: Progressing   Problem: Activity: Goal: Risk for activity intolerance will decrease Outcome: Progressing   Problem: Nutrition: Goal: Adequate nutrition will be maintained Outcome: Progressing   Problem: Coping: Goal: Level of anxiety will decrease Outcome: Progressing   Problem: Elimination: Goal: Will not experience complications related to bowel motility Outcome: Progressing Goal: Will not experience complications related to urinary retention Outcome: Progressing   Problem: Pain Managment: Goal: General experience of comfort will improve Outcome: Progressing   Problem: Safety: Goal: Ability to remain free from injury will improve Outcome: Progressing   Problem:  Skin Integrity: Goal: Risk for impaired skin integrity will decrease Outcome: Progressing   

## 2022-06-30 NOTE — ED Notes (Signed)
Assumed care from [place],RN. Pt resting comfortably in bed at this time. Pt on bipap. Family at bedside. Call light with in reach.

## 2022-07-01 ENCOUNTER — Ambulatory Visit: Payer: Medicare Other | Admitting: Nurse Practitioner

## 2022-07-01 ENCOUNTER — Inpatient Hospital Stay: Payer: Medicare Other

## 2022-07-01 ENCOUNTER — Other Ambulatory Visit: Payer: Medicare Other

## 2022-07-01 ENCOUNTER — Inpatient Hospital Stay: Payer: Medicare Other | Admitting: Nurse Practitioner

## 2022-07-01 DIAGNOSIS — N3000 Acute cystitis without hematuria: Secondary | ICD-10-CM | POA: Diagnosis not present

## 2022-07-01 DIAGNOSIS — J9621 Acute and chronic respiratory failure with hypoxia: Secondary | ICD-10-CM | POA: Diagnosis not present

## 2022-07-01 DIAGNOSIS — I480 Paroxysmal atrial fibrillation: Secondary | ICD-10-CM | POA: Diagnosis not present

## 2022-07-01 DIAGNOSIS — I5032 Chronic diastolic (congestive) heart failure: Secondary | ICD-10-CM | POA: Diagnosis not present

## 2022-07-01 DIAGNOSIS — J9622 Acute and chronic respiratory failure with hypercapnia: Secondary | ICD-10-CM | POA: Diagnosis not present

## 2022-07-01 DIAGNOSIS — J441 Chronic obstructive pulmonary disease with (acute) exacerbation: Secondary | ICD-10-CM | POA: Diagnosis not present

## 2022-07-01 LAB — CBC
HCT: 28.3 % — ABNORMAL LOW (ref 36.0–46.0)
Hemoglobin: 8.1 g/dL — ABNORMAL LOW (ref 12.0–15.0)
MCH: 28.5 pg (ref 26.0–34.0)
MCHC: 28.6 g/dL — ABNORMAL LOW (ref 30.0–36.0)
MCV: 99.6 fL (ref 80.0–100.0)
Platelets: 168 10*3/uL (ref 150–400)
RBC: 2.84 MIL/uL — ABNORMAL LOW (ref 3.87–5.11)
RDW: 17.7 % — ABNORMAL HIGH (ref 11.5–15.5)
WBC: 9.5 10*3/uL (ref 4.0–10.5)
nRBC: 0 % (ref 0.0–0.2)

## 2022-07-01 LAB — BLOOD CULTURE ID PANEL (REFLEXED) - BCID2

## 2022-07-01 LAB — PHOSPHORUS: Phosphorus: 2.8 mg/dL (ref 2.5–4.6)

## 2022-07-01 LAB — ECHOCARDIOGRAM COMPLETE
AR max vel: 1.61 cm2
AV Area VTI: 1.64 cm2
AV Area mean vel: 1.49 cm2
AV Mean grad: 15 mmHg
AV Peak grad: 21.5 mmHg
Ao pk vel: 2.32 m/s
Area-P 1/2: 3.29 cm2
Height: 65 in
S' Lateral: 3.3 cm

## 2022-07-01 LAB — BASIC METABOLIC PANEL
Anion gap: 8 (ref 5–15)
BUN: 47 mg/dL — ABNORMAL HIGH (ref 8–23)
CO2: 33 mmol/L — ABNORMAL HIGH (ref 22–32)
Calcium: 8.5 mg/dL — ABNORMAL LOW (ref 8.9–10.3)
Chloride: 99 mmol/L (ref 98–111)
Creatinine, Ser: 1.32 mg/dL — ABNORMAL HIGH (ref 0.44–1.00)
GFR, Estimated: 40 mL/min — ABNORMAL LOW (ref >60)
Glucose, Bld: 129 mg/dL — ABNORMAL HIGH (ref 70–99)
Potassium: 4.2 mmol/L (ref 3.5–5.1)
Sodium: 140 mmol/L (ref 135–145)

## 2022-07-01 LAB — CULTURE, BLOOD (ROUTINE X 2): Culture: NO GROWTH

## 2022-07-01 LAB — GLUCOSE, CAPILLARY
Glucose-Capillary: 119 mg/dL — ABNORMAL HIGH (ref 70–99)
Glucose-Capillary: 179 mg/dL — ABNORMAL HIGH (ref 70–99)
Glucose-Capillary: 227 mg/dL — ABNORMAL HIGH (ref 70–99)
Glucose-Capillary: 282 mg/dL — ABNORMAL HIGH (ref 70–99)

## 2022-07-01 LAB — URINE CULTURE: Culture: >=100000 — AB

## 2022-07-01 LAB — MAGNESIUM: Magnesium: 2.2 mg/dL (ref 1.7–2.4)

## 2022-07-01 MED ORDER — SODIUM CHLORIDE 0.9 % IV SOLN
1.0000 g | Freq: Once | INTRAVENOUS | Status: AC
  Administered 2022-07-02: 1 g via INTRAVENOUS
  Filled 2022-07-01: qty 10

## 2022-07-01 MED ORDER — SODIUM CHLORIDE 0.9 % IV SOLN: 1.0000 g | Freq: Once | INTRAVENOUS | Status: DC

## 2022-07-01 MED ORDER — SODIUM CHLORIDE 0.9 % IV SOLN
2.0000 g | INTRAVENOUS | Status: DC
  Administered 2022-07-02 – 2022-07-05 (×4): 2 g via INTRAVENOUS
  Filled 2022-07-01: qty 2
  Filled 2022-07-01: qty 20
  Filled 2022-07-01: qty 2
  Filled 2022-07-01: qty 20
  Filled 2022-07-01: qty 2

## 2022-07-01 MED ORDER — BISOPROLOL FUMARATE 5 MG PO TABS
5.0000 mg | ORAL_TABLET | Freq: Every day | ORAL | Status: DC
  Administered 2022-07-01 – 2022-07-07 (×6): 5 mg via ORAL
  Filled 2022-07-01 (×7): qty 1

## 2022-07-01 NOTE — Progress Notes (Addendum)
[facility] Progress Note  Patient: Alexandria [name]    ZOX:[id number]  DOA: 06/29/2022     Date of Service: the patient was seen and examined on 07/01/2022  Chief Complaint  Patient presents with   Altered Mental Status   Brief hospital course: CANDIES PALM is a 87 y.o. female with medical history significant of COPD with chronic hypoxic respiratory failure on 2.5 L of supplemental oxygen, iron deficiency anemia on chronic transfusions, HFpEF, type 2 diabetes, hypertension, hyperlipidemia, hypothyroidism, OSA, mild aortic stenosis, who presents to the ED due to altered mental status. History obtained from patient's daughter at bedside, [name], due to patient's altered mental status.  Approximately 10 days ago, patient's lethargy and fatigue have become significantly worse.  [name] states that patient would regularly fall asleep throughout the day.  In addition, patient felt generally weak.  They believe that it may be due to her anemia and had a follow-up with her oncologist.  She had a iron transfusion on 4/17 and blood transfusion on 4/18.  Despite this, lethargy continued to worsen.  In addition, patient had a fever on 4/21 in 4/22.  They went to an oncology follow-up today and was sent to the ED.    ED workup: Hypoxic respiratory failure, O2 sats dropped to 73%, patient was placed on oxygen and BiPAP in the ED Leukocytosis, WBC count elevated CT head negative for any acute findings, CXR demonstrated cardiomegaly with vascular congestion.  [facility] hospitalist consulted for admission and further management as below.  Assessment and Plan:  Metabolic encephalopathy, multifactorial could be due to hypoxic and hypercapnic respiratory failure, UTI and A-fib with RVR.  Encephalopathy resolved, patient is back to her baseline. Continue supportive care and management as below.  Acute hypoxic and hypercapnic respiratory failure most likely COPD exacerbation and OSA, hypoventilation syndrome due to  obesity ABG reviewed, improved after BiPAP S/p BiPAP overnight, use BiPAP as needed S/p Solu-Medrol 125 mg x 1 dose given, started prednisone 40 mg p.o. daily for 4 days Resumed Brovana nebulizer twice daily, continue Pulmicort nebulizer twice daily,  DuoNeb every 6 hourly scheduled, changed to as needed after improvement Continue doxycycline 100 mg IV twice daily prophylaxis for atypical infection Patient was advised to follow with pulmonology as an outpatient, patient may need BiPAP for home use versus sleep study and CPAP.  Transient paroxysmal A-fib with RVR, resolved, converted back to normal sinus rhythm S/p amnio bolus was given, IV infusion was discontinued. Started  amiodarone 200 mg p.o. BID S/p Lopressor 5 mg IV given,  Patient is not a good candidate for anticoagulation due to risk of fall, anemia requiring transfusions. 4/25 started bisoprolol 5 mg Continue to monitor on telemetry,  Cardiology consult appreciated, recommended Zio patch on discharge   Chronic diastolic heart failure BNP 245, slightly elevated Continue Lasix 40 mg IV daily Held torsemide 20 mg home dose for now Monitor intake output and renal functions  UTI, UA positive, urine culture growing E. coli, follow sensitivity report Continue ceftriaxone 1 g IV daily Urine culture sensitivity pending  NIDDM T2, held home medications for now Continue NovoLog sliding scale, monitor CBG,  Depression, continue Prozac Hypothyroid, continue Synthroid   Body mass index is 38.37 kg/m.  Interventions:       Diet: Heart healthy diet/carb modified diet/fluid restriction 1.5 to 2/day DVT Prophylaxis: Subcutaneous Lovenox   Advance goals of care discussion: Full code  Family Communication: family was not present at bedside, at the time of interview.  The  pt provided permission to discuss medical plan with the family. Opportunity was given to ask question and all questions were answered satisfactorily.  4/25,  discussed with patient's daughter over the phone  Disposition:  Pt is from Home, admitted with Resp failure, A.fib and UTI, still has Resp Failure on IV abx , which precludes a safe discharge. Discharge to Home, when clinically stable, may need few days to stay in the hospital..  Subjective: No significant events overnight, patient feels improvement in the breathing, denies any chest pain or palpitation, no any other active issues.   Physical Exam: General: NAD, mild SOB, resting comfortably in the bed Appear in no distress, affect appropriate Eyes: PERRLA ENT: Oral Mucosa Clear, dry Neck: no JVD,  Cardiovascular: S1 and S2 Present, no Murmur,  Respiratory: Equal air entry bilaterally, mild crackles, mild wheezing appreciated. Abdomen: Bowel Sound present, Soft and no tenderness,  Skin: no rashes Extremities: no Pedal edema, no calf tenderness Neurologic: without any new focal findings Gait not checked due to patient safety concerns  Vitals:   06/30/22 2236 07/01/22 0500 07/01/22 0722 07/01/22 0744  BP: (!) 114/41   (!) 149/53  Pulse: 73   83  Resp: 20   18  Temp: 98.1 F (36.7 C)   98.3 F (36.8 C)  TempSrc:      SpO2: 98%  96% 98%  Weight:  74.8 kg    Height:        Intake/Output Summary (Last 24 hours) at 07/01/2022 1424 Last data filed at 07/01/2022 0408 Gross per 24 hour  Intake 1661.79 ml  Output 850 ml  Net 811.79 ml   Filed Weights   06/29/22 1524 07/01/22 0500  Weight: 104.6 kg 74.8 kg    Data Reviewed: I have personally reviewed and interpreted daily labs, tele strips, imagings as discussed above. I reviewed all nursing notes, pharmacy notes, vitals, pertinent old records I have discussed plan of care as described above with RN and patient/family.  CBC: Recent Labs  Lab 06/29/22 1302 06/29/22 1437 06/30/22 0631 07/01/22 0611  WBC 12.5* 12.3* 8.3 9.5  NEUTROABS 11.3* 11.0* 8.1*  --   HGB 7.7* 8.1* 8.3* 8.1*  HCT 26.7* 28.4* 29.8* 28.3*  MCV  100.4* 101.1* 103.8* 99.6  PLT 125* 136* 131* 168   Basic Metabolic Panel: Recent Labs  Lab 06/29/22 1302 06/29/22 1437 06/30/22 0631 07/01/22 0611  NA 134* 136 141 140  K 4.6 4.6 4.7 4.2  CL 94* 95* 99 99  CO2 33* 38* 34* 33*  GLUCOSE 174* 130* 195* 129*  BUN 33* 33* 35* 47*  CREATININE 1.38* 1.36* 1.33* 1.32*  CALCIUM 7.8* 7.8* 8.2* 8.5*  MG  --   --  2.2 2.2  PHOS  --   --   --  2.8    Studies: ECHOCARDIOGRAM COMPLETE  Result Date: 07/01/2022    ECHOCARDIOGRAM REPORT   Patient Name:   AVERY EUSTICE Date of Exam: 06/30/2022 Medical Rec #:  [id number]       Height:       65.0 in Accession #:    [id number]      Weight:       230.6 lb Date of Birth:  1935/07/10       BSA:          2.102 m Patient Age:    86 years        BP:           118/55 mmHg Patient Gender: F  HR:           72 bpm. Exam Location:  [facility] Procedure: 2D Echo, Cardiac Doppler, Color Doppler and Intracardiac            Opacification Agent Indications:     I48.91 Atrial Fibrillation  History:         Patient has prior history of Echocardiogram examinations, most                  recent 03/29/2022. COPD; Risk Factors:Hypertension, Diabetes and                  Dyslipidemia. Obstructive sleep apnea. Hypothyroidism.  Sonographer:     Daphine Deutscher RDCS Referring Phys:  [id number] Lanney Gins FOUST Diagnosing Phys: Julien Nordmann MD IMPRESSIONS  1. Left ventricular ejection fraction, by estimation, is 60 to 65%. The left ventricle has normal function. The left ventricle has no regional wall motion abnormalities. Left ventricular diastolic parameters are indeterminate.  2. Right ventricular systolic function is normal. The right ventricular size is normal.  3. The mitral valve is normal in structure. Mild mitral valve regurgitation. No evidence of mitral stenosis. Moderate mitral annular calcification.  4. The aortic valve is normal in structure. Aortic valve regurgitation is not visualized. Mild aortic valve stenosis.  Aortic valve area, by VTI measures 1.64 cm. Aortic valve mean gradient measures 15.0 mmHg. Aortic valve Vmax measures 2.32 m/s.  5. The inferior vena cava is normal in size with greater than 50% respiratory variability, suggesting right atrial pressure of 3 mmHg. FINDINGS  Left Ventricle: Left ventricular ejection fraction, by estimation, is 60 to 65%. The left ventricle has normal function. The left ventricle has no regional wall motion abnormalities. Definity contrast agent was given IV to delineate the left ventricular  endocardial borders. The left ventricular internal cavity size was normal in size. There is no left ventricular hypertrophy. Left ventricular diastolic parameters are indeterminate. Right Ventricle: The right ventricular size is normal. No increase in right ventricular wall thickness. Right ventricular systolic function is normal. Left Atrium: Left atrial size was normal in size. Right Atrium: Right atrial size was normal in size. Pericardium: There is no evidence of pericardial effusion. Mitral Valve: The mitral valve is normal in structure. Moderate mitral annular calcification. Mild mitral valve regurgitation. No evidence of mitral valve stenosis. Tricuspid Valve: The tricuspid valve is normal in structure. Tricuspid valve regurgitation is not demonstrated. No evidence of tricuspid stenosis. Aortic Valve: The aortic valve is normal in structure. Aortic valve regurgitation is not visualized. Mild aortic stenosis is present. Aortic valve mean gradient measures 15.0 mmHg. Aortic valve peak gradient measures 21.5 mmHg. Aortic valve area, by VTI measures 1.64 cm. Pulmonic Valve: The pulmonic valve was normal in structure. Pulmonic valve regurgitation is not visualized. No evidence of pulmonic stenosis. Aorta: The aortic root is normal in size and structure. Venous: The inferior vena cava is normal in size with greater than 50% respiratory variability, suggesting right atrial pressure of 3 mmHg.  IAS/Shunts: No atrial level shunt detected by color flow Doppler.  LEFT VENTRICLE PLAX 2D LVIDd:         4.90 cm   Diastology LVIDs:         3.30 cm   LV e' medial:    9.03 cm/s LV PW:         1.00 cm   LV E/e' medial:  16.8 LV IVS:        1.00 cm   LV e'  lateral:   11.60 cm/s LVOT diam:     1.80 cm   LV E/e' lateral: 13.1 LV SV:         75 LV SV Index:   36 LVOT Area:     2.54 cm  RIGHT VENTRICLE             IVC RV Basal diam:  3.70 cm     IVC diam: 1.70 cm RV S prime:     12.15 cm/s TAPSE (M-mode): 2.1 cm LEFT ATRIUM             Index        RIGHT ATRIUM           Index LA diam:        5.10 cm 2.43 cm/m   RA Area:     13.00 cm LA Vol (A2C):   93.2 ml 44.35 ml/m  RA Volume:   29.60 ml  14.09 ml/m LA Vol (A4C):   68.3 ml 32.50 ml/m LA Biplane Vol: 80.8 ml 38.45 ml/m  AORTIC VALVE AV Area (Vmax):    1.61 cm AV Area (Vmean):   1.49 cm AV Area (VTI):     1.64 cm AV Vmax:           231.75 cm/s AV Vmean:          166.250 cm/s AV VTI:            0.458 m AV Peak Grad:      21.5 mmHg AV Mean Grad:      15.0 mmHg LVOT Vmax:         147.00 cm/s LVOT Vmean:        97.100 cm/s LVOT VTI:          0.296 m LVOT/AV VTI ratio: 0.65  AORTA Ao Root diam: 3.00 cm MITRAL VALVE MV Area (PHT): 3.29 cm     SHUNTS MV Decel Time: 231 msec     Systemic VTI:  0.30 m MV E velocity: 152.00 cm/s  Systemic Diam: 1.80 cm MV A velocity: 120.75 cm/s MV E/A ratio:  1.26 Julien Nordmann MD Electronically signed by Julien Nordmann MD Signature Date/Time: 07/01/2022/7:52:14 AM    Final     Scheduled Meds:  amiodarone  200 mg Oral BID   arformoterol  15 mcg Nebulization BID   bisoprolol  5 mg Oral Daily   budesonide  0.5 mg Nebulization BID   enoxaparin (LOVENOX) injection  0.5 mg/kg Subcutaneous Q24H   ezetimibe  10 mg Oral QHS   FLUoxetine  20 mg Oral BID   insulin aspart  0-9 Units Subcutaneous TID WC   ipratropium-albuterol  3 mL Nebulization BID   levothyroxine  125 mcg Oral Q0600   predniSONE  40 mg Oral Q breakfast   sodium  chloride flush  3 mL Intravenous Q12H   Continuous Infusions:  amiodarone Stopped (06/30/22 0941)   cefTRIAXone (ROCEPHIN)  IV Stopped (06/30/22 1759)   doxycycline (VIBRAMYCIN) IV 100 mg (07/01/22 0942)   PRN Meds: acetaminophen **OR** acetaminophen, metoprolol tartrate, ondansetron **OR** ondansetron (ZOFRAN) IV, polyethylene glycol  Time spent: 50 minutes  Author: Gillis Santa. MD [facility] 07/01/2022 2:24 PM  To reach On-call, see care teams to locate the attending and reach out to them via [email or web address]. If 7PM-7AM, please contact night-coverage If you still have difficulty reaching the attending provider, please page the [facility] (Director on Call) for [facility] on amion for assistance.

## 2022-07-01 NOTE — Plan of Care (Signed)
  Problem: Education: Goal: Ability to demonstrate management of disease process will improve Outcome: Progressing   Problem: Activity: Goal: Capacity to carry out activities will improve Outcome: Progressing   Problem: Cardiac: Goal: Ability to achieve and maintain adequate cardiopulmonary perfusion will improve Outcome: Progressing   Problem: Education: Goal: Knowledge of disease or condition will improve Outcome: Progressing   Problem: Activity: Goal: Ability to tolerate increased activity will improve Outcome: Progressing   Problem: Education: Goal: Ability to describe self-care measures that may prevent or decrease complications (Diabetes Survival Skills Education) will improve Outcome: Progressing   Problem: Coping: Goal: Ability to adjust to condition or change in health will improve Outcome: Progressing   Problem: Fluid Volume: Goal: Ability to maintain a balanced intake and output will improve Outcome: Progressing

## 2022-07-01 NOTE — Evaluation (Addendum)
Physical Therapy Evaluation Patient Details Name: Alexandria [name] MRN: [id number] DOB: 06-Jun-1935 Today's Date: 07/01/2022  History of Present Illness  87 y.o. female with a h/o chronic HFrEF, coronary calcium, lymphedema, HTN, HL, COPD on home O2, Dm2, CKD stage 3, mild AS, morbid obesity, IDA req IV rion and PRMCs, OSA, severe deconditioning, RUL lung cancer and depression, presented for AMS admitted for Afib RVR, UTI.   Clinical Impression  Patient alert, caregiver at bedside, oriented x4. Pt reported at baseline she use her rollator, caregiver is present 24/7 and she needs assistance with ADLs, and intermittently physical assistance for transfers/bed mobility.   She was able to come up into sitting EOB with CGA, extended time and use of bed rails. Rest breaks throughout mobility due to SOB, spO2 >90% on 3L throughout. Sit <> stand with CGA and PT stabilizing RW (similar to rollator at home), first attempt pt unable to take any steps due to reported "knees wobbly". Second attempt She was able to ambulate ~76ft with 1-2 standing rest breaks due to SOB.  Overall the patient demonstrated deficits (see "PT Problem List") that impede the patient's functional abilities, safety, and mobility and would benefit from skilled PT intervention. Recommendation is to continued skilled PT services to maximize PLOF and safety.        Recommendations for follow up therapy are one component of a multi-disciplinary discharge planning process, led by the attending physician.  Recommendations may be updated based on patient status, additional functional criteria and insurance authorization.  Follow Up Recommendations       Assistance Recommended at Discharge Frequent or constant Supervision/Assistance  Patient can return home with the following  A little help with walking and/or transfers;Assistance with cooking/housework;Direct supervision/assist for medications management;Assist for transportation;A little help  with bathing/dressing/bathroom;Help with stairs or ramp for entrance    Equipment Recommendations Rolling walker (2 wheels)  Recommendations for Other Services       Functional Status Assessment Patient has had a recent decline in their functional status and demonstrates the ability to make significant improvements in function in a reasonable and predictable amount of time.     Precautions / Restrictions Precautions Precautions: Fall Restrictions Weight Bearing Restrictions: No      Mobility  Bed Mobility Overal bed mobility: Needs Assistance Bed Mobility: Supine to Sit     Supine to sit: Min guard     General bed mobility comments: CGA  for trunk elevation, extended time, bed rails    Transfers Overall transfer level: Needs assistance Equipment used: Rolling walker (2 wheels) Transfers: Sit to/from Stand Sit to Stand: Min guard           General transfer comment: performed twice    Ambulation/Gait Ambulation/Gait assistance: Min guard Gait Distance (Feet): 20 Feet Assistive device: Rolling walker (2 wheels)         General Gait Details: effortful, SOB  Stairs            Wheelchair Mobility    Modified Rankin (Stroke Patients Only)       Balance Overall balance assessment: Needs assistance Sitting-balance support: Feet supported Sitting balance-[name]: Good     Standing balance support: Reliant on assistive device for balance Standing balance-[name]: Poor                               Pertinent Vitals/Pain Pain Assessment Pain Assessment: No/denies pain    Home Living Family/patient expects  to be discharged to:: Private residence Living Arrangements: Other (Comment) Available Help at Discharge: Available 24 hours/day (caregiver) Type of Home: Apartment Home Access: Ramped entrance       Home Layout: Multi-level;Able to live on main level with bedroom/bathroom Home Equipment: Rollator (4  wheels);BSC/3in1;Wheelchair - manual;Other (comment) (adjustable bed)      Prior Function Prior Level of Function : Needs assist       Physical Assist : Mobility (physical);ADLs (physical) Mobility (physical): Bed mobility;Transfers ADLs (physical): Bathing;Dressing;Toileting;IADLs Mobility Comments: sometimes needs assistance with transfers, bed mobility       Hand Dominance        Extremity/Trunk Assessment   Upper Extremity Assessment Upper Extremity Assessment: Generalized weakness    Lower Extremity Assessment Lower Extremity Assessment: Generalized weakness    Cervical / Trunk Assessment Cervical / Trunk Assessment: Normal  Communication   Communication: No difficulties  Cognition Arousal/Alertness: Awake/alert Behavior During Therapy: WFL for tasks assessed/performed Overall Cognitive Status: Within Functional Limits for tasks assessed                                          General Comments      Exercises     Assessment/Plan    PT Assessment Patient needs continued PT services  PT Problem List Decreased strength;Decreased mobility;Decreased range of motion;Decreased activity tolerance;Decreased balance;Cardiopulmonary status limiting activity       PT Treatment Interventions DME instruction;Therapeutic activities;Gait training;Therapeutic exercise;Patient/family education;Balance training;Functional mobility training;Neuromuscular re-education    PT Goals (Current goals can be found in the Care Plan section)  Acute Rehab PT Goals Patient Stated Goal: to get stronger PT Goal Formulation: With patient Time For Goal Achievement: 07/15/22 Potential to Achieve Goals: Good    Frequency Min 4X/week     Co-evaluation               AM-PAC PT "6 Clicks" Mobility  Outcome Measure Help needed turning from your back to your side while in a flat bed without using bedrails?: A Little Help needed moving from lying on your back to  sitting on the side of a flat bed without using bedrails?: A Little Help needed moving to and from a bed to a chair (including a wheelchair)?: A Little Help needed standing up from a chair using your arms (e.g., wheelchair or bedside chair)?: A Little Help needed to walk in hospital room?: A Little Help needed climbing 3-5 steps with a railing? : Total 6 Click Score: 16    End of Session Equipment Utilized During Treatment: Gait belt;Oxygen (3L) Activity Tolerance: Patient tolerated treatment well Patient left: in bed;with call bell/phone within reach (sitting EOB with caregiver in room) Nurse Communication: Mobility status PT Visit Diagnosis: Other abnormalities of gait and mobility (R26.89);Muscle weakness (generalized) (M62.81)    Time: 1610-9604 PT Time Calculation (min) (ACUTE ONLY): 22 min   Charges:   PT Evaluation $PT Eval Moderate Complexity: 1 Mod PT Treatments $Therapeutic Activity: 8-22 mins        Olga Coaster PT, DPT 2:24 PM,07/01/22

## 2022-07-01 NOTE — Evaluation (Signed)
Occupational Therapy Evaluation Patient Details Name: Alexandria [name] MRN: [id number] DOB: [birth year] Today's Date: 07/01/2022   History of Present Illness 87 y.o. female with a h/o chronic HFrEF, coronary calcium, lymphedema, HTN, HL, COPD on home O2, Dm2, CKD stage 3, mild AS, morbid obesity, IDA req IV rion and PRMCs, OSA, severe deconditioning, RUL lung cancer and depression, presented for AMS admitted for Afib RVR, UTI.   Clinical Impression   Alexandria [name] was seen for OT evaluation this date. Prior to hospital admission, pt was MOD I using rollator for limited household distance, assist for ADLs from caregiver. Pt presents to acute OT demonstrating impaired ADL performance and functional mobility 2/2 decreased activity tolerance. Pt currently requires MIN A + RW sit<>stand, assist to stabilize RW as pt uses rollator at baseline. SUPERVISION ~20 ft mobility, increased shortness of breath, resolved quickly with seated rest. Caregiver assists with pericare. Pt would benefit from skilled OT to address noted impairments and functional limitations (see below for any additional details). Upon hospital discharge, recommend follow up therapy 1-2 times/week.   Recommendations for follow up therapy are one component of a multi-disciplinary discharge planning process, led by the attending physician.  Recommendations may be updated based on patient status, additional functional criteria and insurance authorization.   Assistance Recommended at Discharge Intermittent Supervision/Assistance  Patient can return home with the following A little help with walking and/or transfers;Help with stairs or ramp for entrance;A lot of help with bathing/dressing/bathroom    Functional Status Assessment  Patient has had a recent decline in their functional status and demonstrates the ability to make significant improvements in function in a reasonable and predictable amount of time.  Equipment Recommendations  None  recommended by OT    Recommendations for Other Services       Precautions / Restrictions Precautions Precautions: Fall Restrictions Weight Bearing Restrictions: No      Mobility Bed Mobility Overal bed mobility: Needs Assistance Bed Mobility: Sit to Supine       Sit to supine: Supervision        Transfers Overall transfer level: Needs assistance Equipment used: Rolling walker (2 wheels) Transfers: Sit to/from Stand Sit to Stand: Min assist           General transfer comment: requires RW to be stabilized - reports using 4WW at home      Balance Overall balance assessment: Needs assistance Sitting-balance support: Feet supported Sitting balance-[name]: Good     Standing balance support: Reliant on assistive device for balance Standing balance-[name]: Fair                             ADL either performed or assessed with clinical judgement   ADL Overall ADL's : Needs assistance/impaired                                       General ADL Comments: SBA + RW toilet t/f, requires seated rest break. Caregivers assists with pericare.      Pertinent Vitals/Pain Pain Assessment Pain Assessment: No/denies pain     Hand Dominance     Extremity/Trunk Assessment Upper Extremity Assessment Upper Extremity Assessment: Generalized weakness   Lower Extremity Assessment Lower Extremity Assessment: Generalized weakness   Cervical / Trunk Assessment Cervical / Trunk Assessment: Normal   Communication Communication Communication: No difficulties   Cognition Arousal/Alertness: Awake/alert  Behavior During Therapy: WFL for tasks assessed/performed Overall Cognitive Status: Within Functional Limits for tasks assessed                                       General Comments  SpO2 96% on 3.5L [place]            Home Living Family/patient expects to be discharged to:: Private residence Living Arrangements:  Other (Comment) Available Help at Discharge: Available 24 hours/day (caregiver) Type of Home: Apartment Home Access: Ramped entrance     Home Layout: Multi-level;Able to live on main level with bedroom/bathroom     Bathroom Shower/Tub: Producer, television/film/video: Standard     Home Equipment: Rollator (4 wheels);BSC/3in1;Wheelchair - manual;Other (comment) (adjustable bed)          Prior Functioning/Environment Prior Level of Function : Needs assist       Physical Assist : Mobility (physical);ADLs (physical) Mobility (physical): Bed mobility;Transfers ADLs (physical): Bathing;Dressing;Toileting;IADLs Mobility Comments: sometimes needs assistance with transfers, bed mobility          OT Problem List: Decreased strength;Decreased activity tolerance      OT Treatment/Interventions: Self-care/ADL training;Therapeutic exercise;Energy conservation;DME and/or AE instruction;Therapeutic activities;Balance training;Patient/family education    OT Goals(Current goals can be found in the care plan section) Acute Rehab OT Goals Patient Stated Goal: to go home OT Goal Formulation: With patient Time For Goal Achievement: 07/15/22 Potential to Achieve Goals: Good ADL Goals Pt Will Perform Grooming: with modified independence;sitting Pt Will Perform Lower Body Dressing: with min assist;with caregiver independent in assisting;sit to/from stand Pt Will Transfer to Toilet: with modified independence;ambulating;bedside commode  OT Frequency: Min 2X/week    Co-evaluation              AM-PAC OT "6 Clicks" Daily Activity     Outcome Measure Help from another person eating meals?: None Help from another person taking care of personal grooming?: A Little Help from another person toileting, which includes using toliet, bedpan, or urinal?: A Little Help from another person bathing (including washing, rinsing, drying)?: A Lot Help from another person to put on and taking off  regular upper body clothing?: A Little Help from another person to put on and taking off regular lower body clothing?: A Lot 6 Click Score: 17   End of Session Nurse Communication: Mobility status  Activity Tolerance: Patient tolerated treatment well Patient left: in bed;with call bell/phone within reach;with family/visitor present  OT Visit Diagnosis: Other abnormalities of gait and mobility (R26.89);Muscle weakness (generalized) (M62.81)                Time: 1610-9604 OT Time Calculation (min): 9 min Charges:  OT General Charges $OT Visit: 1 Visit OT Evaluation $OT Eval Low Complexity: 1 Low  Kathie Dike, M.S. OTR/L  07/01/22, 2:44 PM  ascom [phone]

## 2022-07-01 NOTE — TOC Progression Note (Signed)
Transition of Care [facility]) - Progression Note    Patient Details  Name: Alexandria [name] MRN: [id number] Date of Birth: 10/05/1935  [facility] of [facility] CM/SW Contact  Marlowe Sax, RN Phone Number: 07/01/2022, 2:26 PM  Clinical Narrative:    Met with the patient and one of her caregivers int he room, She has DME at home and does not feel she needs additional We discussed STR verses HH She stated that she had been to [facility] in the past but she is going to go home, she has paid caregivers every night and some during the day as well She has used [facility] in the past and wants to use them again for [facility] PT and OT, [facility] has accepted  Her caregivers cook her meals as well No additional needs at this time  Expected Discharge Plan: Home w Home Health Services Barriers to Discharge: Continued Medical Work up  Expected Discharge Plan and Services   Discharge Planning Services: CM Consult Post Acute Care Choice: Home Health Living arrangements for the past 2 months: Single Family Home                 DME Arranged: N/A DME Agency: NA       HH Arranged: PT, OT HH Agency: [facility] Date HH Agency Contacted: 07/01/22 Time HH Agency Contacted: 1425 Representative spoke with at [facility] Agency: [place]   Social Determinants of Health (SDOH) Interventions SDOH Screenings   Food Insecurity: No Food Insecurity (06/30/2022)  Housing: Low Risk  (06/30/2022)  Transportation Needs: No Transportation Needs (06/30/2022)  Utilities: Not At Risk (06/30/2022)  Depression (PHQ2-9): Low Risk  (03/12/2022)  Tobacco Use: Medium Risk (06/30/2022)    Readmission Risk Interventions     No data to display

## 2022-07-01 NOTE — Progress Notes (Signed)
Rounding Note    Patient Name: Alexandria [name] Date of Encounter: 07/01/2022  [facility] Cardiologist: Julien Nordmann, MD   Subjective   She has remained in NSR. She is overall feeling better. Scr/BUN up today>hold diuretic.  Inpatient Medications    Scheduled Meds:  amiodarone  200 mg Oral BID   arformoterol  15 mcg Nebulization BID   budesonide  0.5 mg Nebulization BID   enoxaparin (LOVENOX) injection  0.5 mg/kg Subcutaneous Q24H   ezetimibe  10 mg Oral QHS   FLUoxetine  20 mg Oral BID   furosemide  40 mg Intravenous Daily   insulin aspart  0-9 Units Subcutaneous TID WC   ipratropium-albuterol  3 mL Nebulization BID   levothyroxine  125 mcg Oral Q0600   predniSONE  40 mg Oral Q breakfast   sodium chloride flush  3 mL Intravenous Q12H   Continuous Infusions:  amiodarone Stopped (06/30/22 0941)   cefTRIAXone (ROCEPHIN)  IV Stopped (06/30/22 1759)   doxycycline (VIBRAMYCIN) IV 100 mg (07/01/22 0942)   PRN Meds: acetaminophen **OR** acetaminophen, metoprolol tartrate, ondansetron **OR** ondansetron (ZOFRAN) IV, polyethylene glycol   Vital Signs    Vitals:   06/30/22 2236 07/01/22 0500 07/01/22 0722 07/01/22 0744  BP: (!) 114/41   (!) 149/53  Pulse: 73   83  Resp: 20   18  Temp: 98.1 F (36.7 C)   98.3 F (36.8 C)  TempSrc:      SpO2: 98%  96% 98%  Weight:  74.8 kg    Height:        Intake/Output Summary (Last 24 hours) at 07/01/2022 0958 Last data filed at 07/01/2022 0408 Gross per 24 hour  Intake 1661.79 ml  Output 1300 ml  Net 361.79 ml      07/01/2022    5:00 AM 06/29/2022    3:24 PM 06/29/2022    1:23 PM  Last 3 Weights  Weight (lbs) 164 lb 14.5 oz 230 lb 9.6 oz 230 lb 11.2 oz  Weight (kg) 74.8 kg 104.6 kg 104.645 kg      Telemetry    NSR, PVCs, no further Afib - Personally Reviewed  ECG    No new - Personally Reviewed  Physical Exam   GEN: No acute distress.   Neck: No JVD Cardiac: RRR, no murmurs, rubs, or gallops.   Respiratory: Clear to auscultation bilaterally. GI: Soft, nontender, non-distended  MS: No edema; No deformity. Neuro:  Nonfocal  Psych: Normal affect   Labs    High Sensitivity Troponin:   Recent Labs  Lab 06/29/22 1519  TROPONINIHS 17     Chemistry Recent Labs  Lab 06/29/22 1302 06/29/22 1437 06/30/22 0631 07/01/22 0611  NA 134* 136 141 140  K 4.6 4.6 4.7 4.2  CL 94* 95* 99 99  CO2 33* 38* 34* 33*  GLUCOSE 174* 130* 195* 129*  BUN 33* 33* 35* 47*  CREATININE 1.38* 1.36* 1.33* 1.32*  CALCIUM 7.8* 7.8* 8.2* 8.5*  MG  --   --  2.2 2.2  PROT 6.5 6.3*  --   --   ALBUMIN 2.9* 2.9*  --   --   AST 16 16  --   --   ALT 15 16  --   --   ALKPHOS 104 100  --   --   BILITOT 0.5 0.8  --   --   GFRNONAA 38* 38* 39* 40*  ANIONGAP 7 3* 8 8    Lipids No results for  input(s): "CHOL", "TRIG", "HDL", "LABVLDL", "LDLCALC", "CHOLHDL" in the last 168 hours.  Hematology Recent Labs  Lab 06/29/22 1437 06/30/22 0631 07/01/22 0611  WBC 12.3* 8.3 9.5  RBC 2.81* 2.87* 2.84*  HGB 8.1* 8.3* 8.1*  HCT 28.4* 29.8* 28.3*  MCV 101.1* 103.8* 99.6  MCH 28.8 28.9 28.5  MCHC 28.5* 27.9* 28.6*  RDW 18.1* 17.6* 17.7*  PLT 136* 131* 168   Thyroid  Recent Labs  Lab 06/30/22 0631  TSH 0.899    BNP Recent Labs  Lab 06/29/22 1437  BNP 245.1*    DDimer No results for input(s): "DDIMER" in the last 168 hours.   Radiology    ECHOCARDIOGRAM COMPLETE  Result Date: 07/01/2022    ECHOCARDIOGRAM REPORT   Patient Name:   Alexandria [name] Date of Exam: 06/30/2022 Medical Rec #:  [id number]       Height:       65.0 in Accession #:    [id number]      Weight:       230.6 lb Date of Birth:  08/27/1935       BSA:          2.102 m Patient Age:    86 years        BP:           118/55 mmHg Patient Gender: F               HR:           72 bpm. Exam Location:  [facility] Procedure: 2D Echo, Cardiac Doppler, Color Doppler and Intracardiac            Opacification Agent Indications:     I48.91 Atrial Fibrillation   History:         Patient has prior history of Echocardiogram examinations, most                  recent 03/29/2022. COPD; Risk Factors:Hypertension, Diabetes and                  Dyslipidemia. Obstructive sleep apnea. Hypothyroidism.  Sonographer:     Daphine Deutscher RDCS Referring Phys:  [id number] Lanney Gins FOUST Diagnosing Phys: Julien Nordmann MD IMPRESSIONS  1. Left ventricular ejection fraction, by estimation, is 60 to 65%. The left ventricle has normal function. The left ventricle has no regional wall motion abnormalities. Left ventricular diastolic parameters are indeterminate.  2. Right ventricular systolic function is normal. The right ventricular size is normal.  3. The mitral valve is normal in structure. Mild mitral valve regurgitation. No evidence of mitral stenosis. Moderate mitral annular calcification.  4. The aortic valve is normal in structure. Aortic valve regurgitation is not visualized. Mild aortic valve stenosis. Aortic valve area, by VTI measures 1.64 cm. Aortic valve mean gradient measures 15.0 mmHg. Aortic valve Vmax measures 2.32 m/s.  5. The inferior vena cava is normal in size with greater than 50% respiratory variability, suggesting right atrial pressure of 3 mmHg. FINDINGS  Left Ventricle: Left ventricular ejection fraction, by estimation, is 60 to 65%. The left ventricle has normal function. The left ventricle has no regional wall motion abnormalities. Definity contrast agent was given IV to delineate the left ventricular  endocardial borders. The left ventricular internal cavity size was normal in size. There is no left ventricular hypertrophy. Left ventricular diastolic parameters are indeterminate. Right Ventricle: The right ventricular size is normal. No increase in right ventricular wall thickness. Right ventricular systolic function is normal. Left Atrium:  Left atrial size was normal in size. Right Atrium: Right atrial size was normal in size. Pericardium: There is no evidence  of pericardial effusion. Mitral Valve: The mitral valve is normal in structure. Moderate mitral annular calcification. Mild mitral valve regurgitation. No evidence of mitral valve stenosis. Tricuspid Valve: The tricuspid valve is normal in structure. Tricuspid valve regurgitation is not demonstrated. No evidence of tricuspid stenosis. Aortic Valve: The aortic valve is normal in structure. Aortic valve regurgitation is not visualized. Mild aortic stenosis is present. Aortic valve mean gradient measures 15.0 mmHg. Aortic valve peak gradient measures 21.5 mmHg. Aortic valve area, by VTI measures 1.64 cm. Pulmonic Valve: The pulmonic valve was normal in structure. Pulmonic valve regurgitation is not visualized. No evidence of pulmonic stenosis. Aorta: The aortic root is normal in size and structure. Venous: The inferior vena cava is normal in size with greater than 50% respiratory variability, suggesting right atrial pressure of 3 mmHg. IAS/Shunts: No atrial level shunt detected by color flow Doppler.  LEFT VENTRICLE PLAX 2D LVIDd:         4.90 cm   Diastology LVIDs:         3.30 cm   LV e' medial:    9.03 cm/s LV PW:         1.00 cm   LV E/e' medial:  16.8 LV IVS:        1.00 cm   LV e' lateral:   11.60 cm/s LVOT diam:     1.80 cm   LV E/e' lateral: 13.1 LV SV:         75 LV SV Index:   36 LVOT Area:     2.54 cm  RIGHT VENTRICLE             IVC RV Basal diam:  3.70 cm     IVC diam: 1.70 cm RV S prime:     12.15 cm/s TAPSE (M-mode): 2.1 cm LEFT ATRIUM             Index        RIGHT ATRIUM           Index LA diam:        5.10 cm 2.43 cm/m   RA Area:     13.00 cm LA Vol (A2C):   93.2 ml 44.35 ml/m  RA Volume:   29.60 ml  14.09 ml/m LA Vol (A4C):   68.3 ml 32.50 ml/m LA Biplane Vol: 80.8 ml 38.45 ml/m  AORTIC VALVE AV Area (Vmax):    1.61 cm AV Area (Vmean):   1.49 cm AV Area (VTI):     1.64 cm AV Vmax:           231.75 cm/s AV Vmean:          166.250 cm/s AV VTI:            0.458 m AV Peak Grad:      21.5 mmHg  AV Mean Grad:      15.0 mmHg LVOT Vmax:         147.00 cm/s LVOT Vmean:        97.100 cm/s LVOT VTI:          0.296 m LVOT/AV VTI ratio: 0.65  AORTA Ao Root diam: 3.00 cm MITRAL VALVE MV Area (PHT): 3.29 cm     SHUNTS MV Decel Time: 231 msec     Systemic VTI:  0.30 m MV E velocity: 152.00 cm/s  Systemic Diam: 1.80 cm MV  A velocity: 120.75 cm/s MV E/A ratio:  1.26 Julien Nordmann MD Electronically signed by Julien Nordmann MD Signature Date/Time: 07/01/2022/7:52:14 AM    Final    CT Head Wo Contrast  Result Date: 06/29/2022 CLINICAL DATA:  Mental status change, unknown cause EXAM: CT HEAD WITHOUT CONTRAST TECHNIQUE: Contiguous axial images were obtained from the base of the skull through the vertex without intravenous contrast. RADIATION DOSE REDUCTION: This exam was performed according to the departmental dose-optimization program which includes automated exposure control, adjustment of the mA and/or kV according to patient size and/or use of iterative reconstruction technique. COMPARISON:  CT head April 30, 2021. FINDINGS: Brain: No evidence of acute large vascular territory infarction, hemorrhage, hydrocephalus, extra-axial collection or mass lesion/mass effect. Patchy white matter hypodensities, nonspecific but compatible with chronic microvascular ischemic disease. Vascular: No hyperdense vessel. Skull: No acute fracture. Sinuses/Orbits: Mild paranasal sinus mucosal thickening. No acute orbital findings. Other: No mastoid effusions. IMPRESSION: No evidence of acute intracranial abnormality. Electronically Signed   By: Feliberto Harts M.D.   On: 06/29/2022 15:35   DG Chest Port 1 View  Result Date: 06/29/2022 CLINICAL DATA:  Sepsis EXAM: PORTABLE CHEST 1 VIEW COMPARISON:  X-ray 02/24/2022 and older.  CT scan 03/19/2022 FINDINGS: Enlarged cardiopericardial silhouette with some vascular congestion. Calcified aorta. No consolidation, pneumothorax or effusion. No edema. Films are under penetrated  IMPRESSION: Enlarged cardiopericardial silhouette with vascular congestion. Calcified and tortuous aorta Electronically Signed   By: Karen Kays M.D.   On: 06/29/2022 15:06    Cardiac Studies   Echo 07/01/22 1. Left ventricular ejection fraction, by estimation, is 60 to 65%. The  left ventricle has normal function. The left ventricle has no regional  wall motion abnormalities. Left ventricular diastolic parameters are  indeterminate.   2. Right ventricular systolic function is normal. The right ventricular  size is normal.   3. The mitral valve is normal in structure. Mild mitral valve  regurgitation. No evidence of mitral stenosis. Moderate mitral annular  calcification.   4. The aortic valve is normal in structure. Aortic valve regurgitation is  not visualized. Mild aortic valve stenosis. Aortic valve area, by VTI  measures 1.64 cm. Aortic valve mean gradient measures 15.0 mmHg. Aortic  valve Vmax measures 2.32 m/s.   5. The inferior vena cava is normal in size with greater than 50%  respiratory variability, suggesting right atrial pressure of 3 mmHg.   Patient Profile     87 y.o. female with a h/o chronic HFrEF, coronary calcium, lymphedema, HTN, HL, COPD on home O2, Dm2, CKD stage 3, mild AS, morbid obesity, IDA req IV rion and PRMCs, OSA, severe deconditioning, RUL lung cancer and depression who is being seen for Afib RVR.   Assessment & Plan    Afib RVR - brief episode of afib in the setting of respiratory distress, converted to NSR with IV amio - she has been in NSR, no further afib noted - no CCB 2/2 LLE - no BB with lung disease - CHADSVASC of 7, no plan for PAC given h/o GIB, iron deficiency anemia (hgb 8-9) - started on amiodarone 200mg  BID x 2 weeks, then 200mg  daily - echo showed normal pump function - plan for heart monitor at discharge  UTI/AMS - AMS resolved - Abx per IM  Chronic HFpEF - BNP to 245 - IV lasix 40mg  daily>hold for elevated Scr/BUN - plan to  send home on torsemide 20mg  daily  HTN - Bps good  CKD stage 3 -  Scr/Bun mildly elevated as above, hold diuretics  COPD - on home O2 and BIpap  For questions or updates, please contact [facility] Please consult [email or web address] for contact info under        Signed, Anjelica Gorniak David Stall, PA-C  07/01/2022, 9:58 AM

## 2022-07-02 ENCOUNTER — Inpatient Hospital Stay: Payer: Medicare Other

## 2022-07-02 ENCOUNTER — Inpatient Hospital Stay: Payer: Medicare Other | Attending: Medical

## 2022-07-02 ENCOUNTER — Telehealth: Payer: Self-pay | Admitting: Internal Medicine

## 2022-07-02 ENCOUNTER — Telehealth: Payer: Self-pay | Admitting: *Deleted

## 2022-07-02 DIAGNOSIS — N3000 Acute cystitis without hematuria: Secondary | ICD-10-CM | POA: Diagnosis not present

## 2022-07-02 DIAGNOSIS — I4891 Unspecified atrial fibrillation: Secondary | ICD-10-CM

## 2022-07-02 DIAGNOSIS — I5032 Chronic diastolic (congestive) heart failure: Secondary | ICD-10-CM | POA: Diagnosis not present

## 2022-07-02 DIAGNOSIS — J9621 Acute and chronic respiratory failure with hypoxia: Secondary | ICD-10-CM | POA: Diagnosis not present

## 2022-07-02 DIAGNOSIS — J9622 Acute and chronic respiratory failure with hypercapnia: Secondary | ICD-10-CM | POA: Diagnosis not present

## 2022-07-02 DIAGNOSIS — J441 Chronic obstructive pulmonary disease with (acute) exacerbation: Secondary | ICD-10-CM | POA: Diagnosis not present

## 2022-07-02 DIAGNOSIS — I480 Paroxysmal atrial fibrillation: Secondary | ICD-10-CM | POA: Diagnosis not present

## 2022-07-02 LAB — BPAM RBC
Blood Product Expiration Date: 202405102359
Blood Product Expiration Date: 202405302359
Unit Type and Rh: 9500

## 2022-07-02 LAB — CBC
HCT: 26 % — ABNORMAL LOW (ref 36.0–46.0)
Hemoglobin: 7.6 g/dL — ABNORMAL LOW (ref 12.0–15.0)
MCH: 28.8 pg (ref 26.0–34.0)
MCHC: 29.2 g/dL — ABNORMAL LOW (ref 30.0–36.0)
MCV: 98.5 fL (ref 80.0–100.0)
Platelets: 202 10*3/uL (ref 150–400)
RBC: 2.64 MIL/uL — ABNORMAL LOW (ref 3.87–5.11)
RDW: 17.5 % — ABNORMAL HIGH (ref 11.5–15.5)
WBC: 5.8 10*3/uL (ref 4.0–10.5)
nRBC: 0 % (ref 0.0–0.2)

## 2022-07-02 LAB — GLUCOSE, CAPILLARY
Glucose-Capillary: 87 mg/dL (ref 70–99)
Glucose-Capillary: 165 mg/dL — ABNORMAL HIGH (ref 70–99)
Glucose-Capillary: 240 mg/dL — ABNORMAL HIGH (ref 70–99)
Glucose-Capillary: 165 mg/dL — ABNORMAL HIGH (ref 70–99)

## 2022-07-02 LAB — BASIC METABOLIC PANEL
Anion gap: 8 (ref 5–15)
BUN: 48 mg/dL — ABNORMAL HIGH (ref 8–23)
CO2: 35 mmol/L — ABNORMAL HIGH (ref 22–32)
Calcium: 8.4 mg/dL — ABNORMAL LOW (ref 8.9–10.3)
Chloride: 101 mmol/L (ref 98–111)
Creatinine, Ser: 1.21 mg/dL — ABNORMAL HIGH (ref 0.44–1.00)
GFR, Estimated: 44 mL/min — ABNORMAL LOW (ref >60)
Glucose, Bld: 120 mg/dL — ABNORMAL HIGH (ref 70–99)
Potassium: 4 mmol/L (ref 3.5–5.1)
Sodium: 144 mmol/L (ref 135–145)

## 2022-07-02 LAB — CULTURE, BLOOD (ROUTINE X 2)

## 2022-07-02 LAB — PHOSPHORUS: Phosphorus: 2.7 mg/dL (ref 2.5–4.6)

## 2022-07-02 LAB — IRON AND TIBC
Iron: 43 ug/dL (ref 28–170)
Saturation Ratios: 15 % (ref 10.4–31.8)
TIBC: 283 ug/dL (ref 250–450)
UIBC: 240 ug/dL

## 2022-07-02 LAB — PREPARE RBC (CROSSMATCH)

## 2022-07-02 LAB — URINE CULTURE

## 2022-07-02 LAB — TYPE AND SCREEN

## 2022-07-02 LAB — MAGNESIUM: Magnesium: 2.1 mg/dL (ref 1.7–2.4)

## 2022-07-02 MED ORDER — SODIUM CHLORIDE 0.9% IV SOLUTION: Freq: Once | INTRAVENOUS | Status: AC

## 2022-07-02 MED ORDER — VITAMIN B-12 1000 MCG PO TABS: 1000.0000 ug | ORAL_TABLET | Freq: Every day | ORAL | Status: DC

## 2022-07-02 MED ORDER — ENOXAPARIN SODIUM 40 MG/0.4ML IJ SOSY
40.0000 mg | PREFILLED_SYRINGE | INTRAMUSCULAR | Status: DC
  Administered 2022-07-02 – 2022-07-06 (×5): 40 mg via SUBCUTANEOUS
  Filled 2022-07-02 (×5): qty 0.4

## 2022-07-02 MED ORDER — CYANOCOBALAMIN 1000 MCG/ML IJ SOLN
1000.0000 ug | Freq: Every day | INTRAMUSCULAR | Status: AC
  Administered 2022-07-02 – 2022-07-03 (×2): 1000 ug via INTRAMUSCULAR
  Filled 2022-07-02 (×2): qty 1

## 2022-07-02 MED ORDER — ENOXAPARIN SODIUM 300 MG/3ML IJ SOLN
0.5000 mg/kg | INTRAMUSCULAR | Status: DC
Start: 2022-07-02 — End: 2022-07-02

## 2022-07-02 NOTE — Progress Notes (Signed)
PHARMACY - PHYSICIAN COMMUNICATION CRITICAL VALUE ALERT - BLOOD CULTURE IDENTIFICATION (BCID)  Alexandria [name] is an 87 y.o. female who presented to [facility] on 06/29/2022 with a chief complaint of respiratory failure, COPD exacerbation   Assessment:  E Coli in 1 of 4 bottles, no resistance (include suspected source if known)  Name of physician (or Provider) Contacted: Manuela Schwartz, NP   Current antibiotics: Doxycycline 100 mg IV Q12H and Ceftriaxone 1 gm IV Q24H   Changes to prescribed antibiotics recommended:  Will continue doxy, increase ceftriaxone to 2 gm IV Q24H  Results for orders placed or performed during the hospital encounter of 06/29/22  Blood Culture ID Panel (Reflexed) (Collected: 06/29/2022  2:46 PM)  Result Value Ref Range   Enterococcus faecalis NOT DETECTED NOT DETECTED   Enterococcus Faecium NOT DETECTED NOT DETECTED   Listeria monocytogenes NOT DETECTED NOT DETECTED   Staphylococcus species NOT DETECTED NOT DETECTED   Staphylococcus aureus (BCID) NOT DETECTED NOT DETECTED   Staphylococcus epidermidis NOT DETECTED NOT DETECTED   Staphylococcus lugdunensis NOT DETECTED NOT DETECTED   Streptococcus species NOT DETECTED NOT DETECTED   Streptococcus agalactiae NOT DETECTED NOT DETECTED   Streptococcus pneumoniae NOT DETECTED NOT DETECTED   Streptococcus pyogenes NOT DETECTED NOT DETECTED   A.calcoaceticus-baumannii NOT DETECTED NOT DETECTED   Bacteroides fragilis NOT DETECTED NOT DETECTED   Enterobacterales DETECTED (A) NOT DETECTED   Enterobacter cloacae complex NOT DETECTED NOT DETECTED   Escherichia coli DETECTED (A) NOT DETECTED   Klebsiella aerogenes NOT DETECTED NOT DETECTED   Klebsiella oxytoca NOT DETECTED NOT DETECTED   Klebsiella pneumoniae NOT DETECTED NOT DETECTED   Proteus species NOT DETECTED NOT DETECTED   Salmonella species NOT DETECTED NOT DETECTED   Serratia marcescens NOT DETECTED NOT DETECTED   Haemophilus influenzae NOT DETECTED NOT  DETECTED   Neisseria meningitidis NOT DETECTED NOT DETECTED   Pseudomonas aeruginosa NOT DETECTED NOT DETECTED   Stenotrophomonas maltophilia NOT DETECTED NOT DETECTED   Candida albicans NOT DETECTED NOT DETECTED   Candida auris NOT DETECTED NOT DETECTED   Candida glabrata NOT DETECTED NOT DETECTED   Candida krusei NOT DETECTED NOT DETECTED   Candida parapsilosis NOT DETECTED NOT DETECTED   Candida tropicalis NOT DETECTED NOT DETECTED   Cryptococcus neoformans/gattii NOT DETECTED NOT DETECTED   CTX-M ESBL NOT DETECTED NOT DETECTED   Carbapenem resistance IMP NOT DETECTED NOT DETECTED   Carbapenem resistance KPC NOT DETECTED NOT DETECTED   Carbapenem resistance NDM NOT DETECTED NOT DETECTED   Carbapenem resist OXA 48 LIKE NOT DETECTED NOT DETECTED   Carbapenem resistance VIM NOT DETECTED NOT DETECTED    Marika Mahaffy D 07/02/2022  9:33 PM

## 2022-07-02 NOTE — Telephone Encounter (Signed)
Placed order for 14 day heart monitor.  Pt is currently inpatient.  Can not call instructions to pt.

## 2022-07-02 NOTE — Telephone Encounter (Signed)
Patients daughter would like to know if she can be scheduled for iron on Thursday when she sees Lauren? She is in the hospital now and they are possibly going to be giving her some before she leaves.   Please advise.  Thank you

## 2022-07-02 NOTE — Care Management Important Message (Signed)
Important Message  Patient Details  Name: Alexandria [name] MRN: [id number] Date of Birth: May 09, 1935   Medicare Important Message Given:  N/A - LOS <3 / Initial given by admissions     Olegario Messier A Carmina Walle 07/02/2022, 1:53 PM

## 2022-07-02 NOTE — Progress Notes (Signed)
Rounding Note    Patient Name: Alexandria [name] Date of Encounter: 07/02/2022  [facility] Cardiologist: Julien Nordmann, MD   Subjective   Patient is overall feeling better. Still with some wheezing. No chest pain. She remains in NSR with PVCs.  Inpatient Medications    Scheduled Meds:  amiodarone  200 mg Oral BID   arformoterol  15 mcg Nebulization BID   bisoprolol  5 mg Oral Daily   budesonide  0.5 mg Nebulization BID   enoxaparin (LOVENOX) injection  40 mg Subcutaneous Q24H   ezetimibe  10 mg Oral QHS   FLUoxetine  20 mg Oral BID   insulin aspart  0-9 Units Subcutaneous TID WC   ipratropium-albuterol  3 mL Nebulization BID   levothyroxine  125 mcg Oral Q0600   predniSONE  40 mg Oral Q breakfast   sodium chloride flush  3 mL Intravenous Q12H   Continuous Infusions:  cefTRIAXone (ROCEPHIN)  IV     doxycycline (VIBRAMYCIN) IV Stopped (07/02/22 0017)   PRN Meds: acetaminophen **OR** acetaminophen, metoprolol tartrate, ondansetron **OR** ondansetron (ZOFRAN) IV, polyethylene glycol   Vital Signs    Vitals:   07/01/22 1515 07/01/22 2027 07/01/22 2302 07/02/22 0500  BP: (!) 121/44  (!) 140/49   Pulse: (!) 59  67   Resp: 17  18   Temp: 98.1 F (36.7 C)  98.6 F (37 C)   TempSrc:      SpO2: 99% 98% 100%   Weight:    75.2 kg  Height:        Intake/Output Summary (Last 24 hours) at 07/02/2022 0729 Last data filed at 07/02/2022 0128 Gross per 24 hour  Intake 854.66 ml  Output 500 ml  Net 354.66 ml      07/02/2022    5:00 AM 07/01/2022    5:00 AM 06/29/2022    3:24 PM  Last 3 Weights  Weight (lbs) 165 lb 12.6 oz 164 lb 14.5 oz 230 lb 9.6 oz  Weight (kg) 75.2 kg 74.8 kg 104.6 kg      Telemetry    NSR PVCs, HR 60s - Personally Reviewed  ECG    No new - Personally Reviewed  Physical Exam   GEN: No acute distress.   Neck: No JVD Cardiac: RRR, no murmurs, rubs, or gallops.  Respiratory: minimal wheezing. GI: Soft, nontender, non-distended   MS: No edema; No deformity. Neuro:  Nonfocal  Psych: Normal affect   Labs    High Sensitivity Troponin:   Recent Labs  Lab 06/29/22 1519  TROPONINIHS 17     Chemistry Recent Labs  Lab 06/29/22 1302 06/29/22 1437 06/30/22 0631 07/01/22 0611 07/02/22 0603  NA 134* 136 141 140 144  K 4.6 4.6 4.7 4.2 4.0  CL 94* 95* 99 99 101  CO2 33* 38* 34* 33* 35*  GLUCOSE 174* 130* 195* 129* 120*  BUN 33* 33* 35* 47* 48*  CREATININE 1.38* 1.36* 1.33* 1.32* 1.21*  CALCIUM 7.8* 7.8* 8.2* 8.5* 8.4*  MG  --   --  2.2 2.2 2.1  PROT 6.5 6.3*  --   --   --   ALBUMIN 2.9* 2.9*  --   --   --   AST 16 16  --   --   --   ALT 15 16  --   --   --   ALKPHOS 104 100  --   --   --   BILITOT 0.5 0.8  --   --   --  GFRNONAA 38* 38* 39* 40* 44*  ANIONGAP 7 3* 8 8 8     Lipids No results for input(s): "CHOL", "TRIG", "HDL", "LABVLDL", "LDLCALC", "CHOLHDL" in the last 168 hours.  Hematology Recent Labs  Lab 06/30/22 0631 07/01/22 0611 07/02/22 0603  WBC 8.3 9.5 5.8  RBC 2.87* 2.84* 2.64*  HGB 8.3* 8.1* 7.6*  HCT 29.8* 28.3* 26.0*  MCV 103.8* 99.6 98.5  MCH 28.9 28.5 28.8  MCHC 27.9* 28.6* 29.2*  RDW 17.6* 17.7* 17.5*  PLT 131* 168 202   Thyroid  Recent Labs  Lab 06/30/22 0631  TSH 0.899    BNP Recent Labs  Lab 06/29/22 1437  BNP 245.1*    DDimer No results for input(s): "DDIMER" in the last 168 hours.   Radiology    ECHOCARDIOGRAM COMPLETE  Result Date: 07/01/2022    ECHOCARDIOGRAM REPORT   Patient Name:   Alexandria [name] Date of Exam: 06/30/2022 Medical Rec #:  [id number]       Height:       65.0 in Accession #:    [id number]      Weight:       230.6 lb Date of Birth:  [birth year]       BSA:          2.102 m Patient Age:    86 years        BP:           118/55 mmHg Patient Gender: F               HR:           72 bpm. Exam Location:  [facility] Procedure: 2D Echo, Cardiac Doppler, Color Doppler and Intracardiac            Opacification Agent Indications:     I48.91 Atrial Fibrillation   History:         Patient has prior history of Echocardiogram examinations, most                  recent 03/29/2022. COPD; Risk Factors:Hypertension, Diabetes and                  Dyslipidemia. Obstructive sleep apnea. Hypothyroidism.  Sonographer:     Daphine Deutscher RDCS Referring Phys:  [id number] Lanney Gins FOUST Diagnosing Phys: Julien Nordmann MD IMPRESSIONS  1. Left ventricular ejection fraction, by estimation, is 60 to 65%. The left ventricle has normal function. The left ventricle has no regional wall motion abnormalities. Left ventricular diastolic parameters are indeterminate.  2. Right ventricular systolic function is normal. The right ventricular size is normal.  3. The mitral valve is normal in structure. Mild mitral valve regurgitation. No evidence of mitral stenosis. Moderate mitral annular calcification.  4. The aortic valve is normal in structure. Aortic valve regurgitation is not visualized. Mild aortic valve stenosis. Aortic valve area, by VTI measures 1.64 cm. Aortic valve mean gradient measures 15.0 mmHg. Aortic valve Vmax measures 2.32 m/s.  5. The inferior vena cava is normal in size with greater than 50% respiratory variability, suggesting right atrial pressure of 3 mmHg. FINDINGS  Left Ventricle: Left ventricular ejection fraction, by estimation, is 60 to 65%. The left ventricle has normal function. The left ventricle has no regional wall motion abnormalities. Definity contrast agent was given IV to delineate the left ventricular  endocardial borders. The left ventricular internal cavity size was normal in size. There is no left ventricular hypertrophy. Left ventricular diastolic parameters are indeterminate. Right Ventricle: The  right ventricular size is normal. No increase in right ventricular wall thickness. Right ventricular systolic function is normal. Left Atrium: Left atrial size was normal in size. Right Atrium: Right atrial size was normal in size. Pericardium: There is no evidence  of pericardial effusion. Mitral Valve: The mitral valve is normal in structure. Moderate mitral annular calcification. Mild mitral valve regurgitation. No evidence of mitral valve stenosis. Tricuspid Valve: The tricuspid valve is normal in structure. Tricuspid valve regurgitation is not demonstrated. No evidence of tricuspid stenosis. Aortic Valve: The aortic valve is normal in structure. Aortic valve regurgitation is not visualized. Mild aortic stenosis is present. Aortic valve mean gradient measures 15.0 mmHg. Aortic valve peak gradient measures 21.5 mmHg. Aortic valve area, by VTI measures 1.64 cm. Pulmonic Valve: The pulmonic valve was normal in structure. Pulmonic valve regurgitation is not visualized. No evidence of pulmonic stenosis. Aorta: The aortic root is normal in size and structure. Venous: The inferior vena cava is normal in size with greater than 50% respiratory variability, suggesting right atrial pressure of 3 mmHg. IAS/Shunts: No atrial level shunt detected by color flow Doppler.  LEFT VENTRICLE PLAX 2D LVIDd:         4.90 cm   Diastology LVIDs:         3.30 cm   LV e' medial:    9.03 cm/s LV PW:         1.00 cm   LV E/e' medial:  16.8 LV IVS:        1.00 cm   LV e' lateral:   11.60 cm/s LVOT diam:     1.80 cm   LV E/e' lateral: 13.1 LV SV:         75 LV SV Index:   36 LVOT Area:     2.54 cm  RIGHT VENTRICLE             IVC RV Basal diam:  3.70 cm     IVC diam: 1.70 cm RV S prime:     12.15 cm/s TAPSE (M-mode): 2.1 cm LEFT ATRIUM             Index        RIGHT ATRIUM           Index LA diam:        5.10 cm 2.43 cm/m   RA Area:     13.00 cm LA Vol (A2C):   93.2 ml 44.35 ml/m  RA Volume:   29.60 ml  14.09 ml/m LA Vol (A4C):   68.3 ml 32.50 ml/m LA Biplane Vol: 80.8 ml 38.45 ml/m  AORTIC VALVE AV Area (Vmax):    1.61 cm AV Area (Vmean):   1.49 cm AV Area (VTI):     1.64 cm AV Vmax:           231.75 cm/s AV Vmean:          166.250 cm/s AV VTI:            0.458 m AV Peak Grad:      21.5 mmHg  AV Mean Grad:      15.0 mmHg LVOT Vmax:         147.00 cm/s LVOT Vmean:        97.100 cm/s LVOT VTI:          0.296 m LVOT/AV VTI ratio: 0.65  AORTA Ao Root diam: 3.00 cm MITRAL VALVE MV Area (PHT): 3.29 cm     SHUNTS MV Decel Time: 231 msec  Systemic VTI:  0.30 m MV E velocity: 152.00 cm/s  Systemic Diam: 1.80 cm MV A velocity: 120.75 cm/s MV E/A ratio:  1.26 Julien Nordmann MD Electronically signed by Julien Nordmann MD Signature Date/Time: 07/01/2022/7:52:14 AM    Final     Cardiac Studies   Echo 07/01/22 1. Left ventricular ejection fraction, by estimation, is 60 to 65%. The  left ventricle has normal function. The left ventricle has no regional  wall motion abnormalities. Left ventricular diastolic parameters are  indeterminate.   2. Right ventricular systolic function is normal. The right ventricular  size is normal.   3. The mitral valve is normal in structure. Mild mitral valve  regurgitation. No evidence of mitral stenosis. Moderate mitral annular  calcification.   4. The aortic valve is normal in structure. Aortic valve regurgitation is  not visualized. Mild aortic valve stenosis. Aortic valve area, by VTI  measures 1.64 cm. Aortic valve mean gradient measures 15.0 mmHg. Aortic  valve Vmax measures 2.32 m/s.   5. The inferior vena cava is normal in size with greater than 50%  respiratory variability, suggesting right atrial pressure of 3 mmHg.     Patient Profile     87 y.o. female with a h/o chronic HFrEF, coronary calcium, lymphedema, HTN, HL, COPD on home O2, Dm2, CKD stage 3, mild AS, morbid obesity, IDA req IV rion and PRMCs, OSA, severe deconditioning, RUL lung cancer and depression who is being seen for Afib RVR.   Assessment & Plan    Afib RVR - brief episode of afib in the setting of respiratory distress, converted to NSR with IV amio - she has been in NSR, no further afib noted - no CCB 2/2 LLE - CHADSVASC of 7, no plan for PAC given h/o GIB, iron deficiency  anemia (hgb 8-9) - started on amiodarone 200mg  BID x 2 weeks, then 200mg  daily - started on bisoprolol (in the setting of lung disease) - echo showed normal pump function - plan for heart monitor at discharge   UTI/AMS - AMS resolved - Abx per IM   Chronic HFpEF - BNP to 245 - IV lasix 40mg  daily>held for rising scr/BUN.  - plan to send home on torsemide 20mg  daily, can possibly start tomorrow   HTN - Bps good   CKD stage 3 - Scr/Bun mildly elevated as above,diuretics held   COPD - on home O2 and BIpap  For questions or updates, please contact [facility] Please consult [email or web address] for contact info under        Signed, Donyell Carrell David Stall, PA-C  07/02/2022, 7:29 AM

## 2022-07-02 NOTE — Plan of Care (Signed)
  Problem: Education: Goal: Ability to demonstrate management of disease process will improve Outcome: Progressing   Problem: Activity: Goal: Capacity to carry out activities will improve Outcome: Progressing   Problem: Respiratory: Goal: Ability to maintain a clear airway will improve Outcome: Progressing   Problem: Education: Goal: Ability to describe self-care measures that may prevent or decrease complications (Diabetes Survival Skills Education) will improve Outcome: Progressing   Problem: Coping: Goal: Ability to adjust to condition or change in health will improve Outcome: Progressing   Problem: Health Behavior/Discharge Planning: Goal: Ability to identify and utilize available resources and services will improve Outcome: Progressing   Problem: Nutritional: Goal: Maintenance of adequate nutrition will improve Outcome: Progressing

## 2022-07-02 NOTE — Progress Notes (Signed)
[facility] Progress Note  Patient: Alexandria [name]    ZOX:[id number]  DOA: 06/29/2022     Date of Service: the patient was seen and examined on 07/02/2022  Chief Complaint  Patient presents with   Altered Mental Status   Brief hospital course: ALEXANDERIA GORBY is a 87 y.o. female with medical history significant of COPD with chronic hypoxic respiratory failure on 2.5 L of supplemental oxygen, iron deficiency anemia on chronic transfusions, HFpEF, type 2 diabetes, hypertension, hyperlipidemia, hypothyroidism, OSA, mild aortic stenosis, who presents to the ED due to altered mental status. History obtained from patient's daughter at bedside, [name], due to patient's altered mental status.  Approximately 10 days ago, patient's lethargy and fatigue have become significantly worse.  [name] states that patient would regularly fall asleep throughout the day.  In addition, patient felt generally weak.  They believe that it may be due to her anemia and had a follow-up with her oncologist.  She had a iron transfusion on 4/17 and blood transfusion on 4/18.  Despite this, lethargy continued to worsen.  In addition, patient had a fever on 4/21 in 4/22.  They went to an oncology follow-up today and was sent to the ED.    ED workup: Hypoxic respiratory failure, O2 sats dropped to 73%, patient was placed on oxygen and BiPAP in the ED Leukocytosis, WBC count elevated CT head negative for any acute findings, CXR demonstrated cardiomegaly with vascular congestion.  [facility] hospitalist consulted for admission and further management as below.  Assessment and Plan:  # Metabolic encephalopathy, multifactorial could be due to hypoxic and hypercapnic respiratory failure, UTI and A-fib with RVR.  Encephalopathy resolved, patient is back to her baseline. Continue supportive care and management as below.  # Acute hypoxic and hypercapnic respiratory failure most likely COPD exacerbation and OSA, hypoventilation syndrome due  to obesity ABG reviewed, improved after BiPAP S/p BiPAP overnight, use BiPAP as needed S/p Solu-Medrol 125 mg x 1 dose given, started prednisone 40 mg p.o. daily for 4 days Resumed Brovana nebulizer twice daily, continue Pulmicort nebulizer twice daily,  DuoNeb every 6 hourly scheduled, changed to as needed after improvement Continue doxycycline 100 mg IV twice daily prophylaxis for atypical infection Patient was advised to follow with pulmonology as an outpatient, patient may need BiPAP for home use versus sleep study and CPAP.  # Transient paroxysmal A-fib with RVR, resolved, converted back to normal sinus rhythm S/p amnio bolus was given, IV infusion was discontinued. Started  amiodarone 200 mg p.o. BID S/p Lopressor 5 mg IV given,  Patient is not a good candidate for anticoagulation due to risk of fall, anemia requiring transfusions. 4/25 started bisoprolol 5 mg Continue to monitor on telemetry,  Cardiology consult appreciated, recommended Zio patch, which will be sent to her house. Continue amiodarone 200 mg twice daily for 2 weeks followed by 200 mg p.o. daily.  And continue bisoprolol 5 mg p.o. daily  Chronic diastolic heart failure BNP 245, slightly elevated S/p Lasix 40 mg IV daily, Held torsemide 20 mg home dose for now As per cardiology resume torsemide 20 mg on discharge. Monitor intake output and renal functions  UTI, UA positive, urine culture growing E. coli, sensitive to ceftriaxone, cefazolin and ciprofloxacin. Continue ceftriaxone 1 g IV daily We will plan to discharge on Keflex  Anemia of chronic disease and iron deficiency Patient follows hematologist as an outpatient and getting iron infusions as an outpatient. Hb 7.6, transfuse 1 unit of PRBC due to high  risk for symptomatic anemia and prevent rehospitalization. iron profile, iron level 43 and transferrin saturation 15%, within normal range Recommend to continue oral iron supplement and follow with hematologist as  an outpatient. On 06/16/22 Vitamin B12 level was 303, goal >400, vitamin B12 1000 mcg IM injection daily x 2 doses ordered followed by oral supplement.  NIDDM T2, held home medications for now Continue NovoLog sliding scale, monitor CBG,  Depression, continue Prozac Hypothyroid, continue Synthroid   Body mass index is 38.37 kg/m.  Interventions:       Diet: Heart healthy diet/carb modified diet/fluid restriction 1.5 to 2/day DVT Prophylaxis: Subcutaneous Lovenox   Advance goals of care discussion: Full code  Family Communication: family was not present at bedside, at the time of interview.  The pt provided permission to discuss medical plan with the family. Opportunity was given to ask question and all questions were answered satisfactorily.  4/25, discussed with patient's daughter over the phone  Disposition:  Pt is from Home, admitted with Resp failure, A.fib and UTI, on IV abx and low Hb 1 unit PRBC transfusion ordered, which precludes a safe discharge. Discharge to Home, when clinically stable, most likely discharge tomorrow a.m.   Subjective: No significant events overnight, patient was sitting out of the no the recliner, stated that she is ambulating with help.  She would like to walk with PT and OT and would like to go home with physical therapy.  Denies any chest pain or palpitation, no shortness of breath.  No any other complaints. Patient agreed with PRBC transfusion today and B12 IM injection. Management plan discussed with patient's daughter over the phone.    Physical Exam: General: NAD, mild SOB, sitting comfortably in the recliner Appear in no distress, affect appropriate Eyes: PERRLA ENT: Oral Mucosa Clear, dry Neck: no JVD,  Cardiovascular: S1 and S2 Present, no Murmur,  Respiratory: Equal air entry bilaterally, mild crackles, No wheezing appreciated. Abdomen: Bowel Sound present, Soft and no tenderness,  Skin: no rashes Extremities: no Pedal edema, no  calf tenderness Neurologic: without any new focal findings Gait not checked due to patient safety concerns  Vitals:   07/01/22 2302 07/02/22 0500 07/02/22 0739 07/02/22 0756  BP: (!) 140/49   (!) 102/50  Pulse: 67   65  Resp: 18   17  Temp: 98.6 F (37 C)   98.9 F (37.2 C)  TempSrc:      SpO2: 100%  96% 94%  Weight:  75.2 kg    Height:        Intake/Output Summary (Last 24 hours) at 07/02/2022 1253 Last data filed at 07/02/2022 0758 Gross per 24 hour  Intake 854.66 ml  Output 900 ml  Net -45.34 ml   Filed Weights   06/29/22 1524 07/01/22 0500 07/02/22 0500  Weight: 104.6 kg 74.8 kg 75.2 kg    Data Reviewed: I have personally reviewed and interpreted daily labs, tele strips, imagings as discussed above. I reviewed all nursing notes, pharmacy notes, vitals, pertinent old records I have discussed plan of care as described above with RN and patient/family.  CBC: Recent Labs  Lab 06/29/22 1302 06/29/22 1437 06/30/22 0631 07/01/22 0611 07/02/22 0603  WBC 12.5* 12.3* 8.3 9.5 5.8  NEUTROABS 11.3* 11.0* 8.1*  --   --   HGB 7.7* 8.1* 8.3* 8.1* 7.6*  HCT 26.7* 28.4* 29.8* 28.3* 26.0*  MCV 100.4* 101.1* 103.8* 99.6 98.5  PLT 125* 136* 131* 168 202   Basic Metabolic Panel: Recent Labs  Lab 06/29/22 1302 06/29/22 1437 06/30/22 0631 07/01/22 0611 07/02/22 0603  NA 134* 136 141 140 144  K 4.6 4.6 4.7 4.2 4.0  CL 94* 95* 99 99 101  CO2 33* 38* 34* 33* 35*  GLUCOSE 174* 130* 195* 129* 120*  BUN 33* 33* 35* 47* 48*  CREATININE 1.38* 1.36* 1.33* 1.32* 1.21*  CALCIUM 7.8* 7.8* 8.2* 8.5* 8.4*  MG  --   --  2.2 2.2 2.1  PHOS  --   --   --  2.8 2.7    Studies: No results found.  Scheduled Meds:  sodium chloride   Intravenous Once   amiodarone  200 mg Oral BID   arformoterol  15 mcg Nebulization BID   bisoprolol  5 mg Oral Daily   budesonide  0.5 mg Nebulization BID   cyanocobalamin  1,000 mcg Intramuscular Daily   Followed by   Melene Muller ON 07/04/2022] vitamin B-12   1,000 mcg Oral Daily   enoxaparin (LOVENOX) injection  40 mg Subcutaneous Q24H   ezetimibe  10 mg Oral QHS   FLUoxetine  20 mg Oral BID   insulin aspart  0-9 Units Subcutaneous TID WC   ipratropium-albuterol  3 mL Nebulization BID   levothyroxine  125 mcg Oral Q0600   predniSONE  40 mg Oral Q breakfast   sodium chloride flush  3 mL Intravenous Q12H   Continuous Infusions:  cefTRIAXone (ROCEPHIN)  IV     doxycycline (VIBRAMYCIN) IV 100 mg (07/02/22 1100)   PRN Meds: acetaminophen **OR** acetaminophen, metoprolol tartrate, ondansetron **OR** ondansetron (ZOFRAN) IV, polyethylene glycol  Time spent: 50 minutes  Author: Gillis Santa. MD [facility] 07/02/2022 12:53 PM  To reach On-call, see care teams to locate the attending and reach out to them via [email or web address]. If 7PM-7AM, please contact night-coverage If you still have difficulty reaching the attending provider, please page the [facility] (Director on Call) for [facility] on amion for assistance.

## 2022-07-02 NOTE — Telephone Encounter (Signed)
-----   Message from Cadence David Stall, PA-C sent at 07/02/2022  8:44 AM EDT ----- Regarding: hosp follow-up and heart monitor Pt needs 2 week heart monitor mailed to her house for afib. Also needs hospital follow-up in 4-6 weeks. Thanks

## 2022-07-03 DIAGNOSIS — J9622 Acute and chronic respiratory failure with hypercapnia: Secondary | ICD-10-CM | POA: Diagnosis not present

## 2022-07-03 DIAGNOSIS — J9621 Acute and chronic respiratory failure with hypoxia: Secondary | ICD-10-CM | POA: Diagnosis not present

## 2022-07-03 LAB — CULTURE, BLOOD (ROUTINE X 2): Special Requests: ADEQUATE

## 2022-07-03 LAB — GLUCOSE, CAPILLARY
Glucose-Capillary: 86 mg/dL (ref 70–99)
Glucose-Capillary: 158 mg/dL — ABNORMAL HIGH (ref 70–99)
Glucose-Capillary: 252 mg/dL — ABNORMAL HIGH (ref 70–99)
Glucose-Capillary: 193 mg/dL — ABNORMAL HIGH (ref 70–99)

## 2022-07-03 LAB — BASIC METABOLIC PANEL
Anion gap: 7 (ref 5–15)
BUN: 40 mg/dL — ABNORMAL HIGH (ref 8–23)
CO2: 32 mmol/L (ref 22–32)
Calcium: 8.6 mg/dL — ABNORMAL LOW (ref 8.9–10.3)
Chloride: 102 mmol/L (ref 98–111)
Creatinine, Ser: 1.11 mg/dL — ABNORMAL HIGH (ref 0.44–1.00)
GFR, Estimated: 49 mL/min — ABNORMAL LOW (ref >60)
Glucose, Bld: 105 mg/dL — ABNORMAL HIGH (ref 70–99)
Potassium: 4.1 mmol/L (ref 3.5–5.1)
Sodium: 141 mmol/L (ref 135–145)

## 2022-07-03 LAB — TYPE AND SCREEN: Antibody Screen: POSITIVE

## 2022-07-03 LAB — CBC
HCT: 29.6 % — ABNORMAL LOW (ref 36.0–46.0)
Hemoglobin: 8.7 g/dL — ABNORMAL LOW (ref 12.0–15.0)
MCH: 27.3 pg (ref 26.0–34.0)
MCHC: 29.4 g/dL — ABNORMAL LOW (ref 30.0–36.0)
MCV: 92.8 fL (ref 80.0–100.0)
Platelets: 239 10*3/uL (ref 150–400)
RBC: 3.19 MIL/uL — ABNORMAL LOW (ref 3.87–5.11)
RDW: 23.8 % — ABNORMAL HIGH (ref 11.5–15.5)
WBC: 6.7 10*3/uL (ref 4.0–10.5)
nRBC: 0 % (ref 0.0–0.2)

## 2022-07-03 LAB — BPAM RBC
Blood Product Expiration Date: 202405282359
Unit Type and Rh: 9500
Unit Type and Rh: 9500

## 2022-07-03 LAB — MAGNESIUM: Magnesium: 2.2 mg/dL (ref 1.7–2.4)

## 2022-07-03 LAB — PHOSPHORUS: Phosphorus: 3.3 mg/dL (ref 2.5–4.6)

## 2022-07-03 MED ORDER — PREDNISONE 20 MG PO TABS
20.0000 mg | ORAL_TABLET | Freq: Every day | ORAL | Status: DC
  Administered 2022-07-07: 20 mg via ORAL
  Filled 2022-07-03: qty 1

## 2022-07-03 MED ORDER — MELATONIN 5 MG PO TABS
2.5000 mg | ORAL_TABLET | Freq: Every day | ORAL | Status: DC
  Administered 2022-07-03 – 2022-07-06 (×4): 2.5 mg via ORAL
  Filled 2022-07-03 (×4): qty 1

## 2022-07-03 MED ORDER — VITAMIN B-12 1000 MCG PO TABS
1000.0000 ug | ORAL_TABLET | Freq: Every day | ORAL | Status: DC
  Administered 2022-07-05 – 2022-07-07 (×3): 1000 ug via ORAL
  Filled 2022-07-03 (×4): qty 1

## 2022-07-03 MED ORDER — PREDNISONE 20 MG PO TABS
30.0000 mg | ORAL_TABLET | Freq: Every day | ORAL | Status: AC
  Administered 2022-07-04 – 2022-07-06 (×3): 30 mg via ORAL
  Filled 2022-07-03 (×3): qty 1

## 2022-07-03 MED ORDER — MELATONIN 5 MG PO TABS: 5.0000 mg | ORAL_TABLET | Freq: Every day | ORAL | Status: DC

## 2022-07-03 MED ORDER — CYANOCOBALAMIN 1000 MCG/ML IJ SOLN
1000.0000 ug | Freq: Once | INTRAMUSCULAR | Status: AC
  Administered 2022-07-04: 1000 ug via INTRAMUSCULAR
  Filled 2022-07-03: qty 1

## 2022-07-03 MED ORDER — PREDNISONE 10 MG PO TABS: 10.0000 mg | ORAL_TABLET | Freq: Every day | ORAL | Status: DC

## 2022-07-03 NOTE — Progress Notes (Addendum)
[facility] Progress Note  Patient: Alexandria [name]    ZOX:[id number]  DOA: 06/29/2022     Date of Service: the patient was seen and examined on 07/03/2022  Chief Complaint  Patient presents with   Altered Mental Status   Brief hospital course: COLA GANE is a 87 y.o. female with medical history significant of COPD with chronic hypoxic respiratory failure on 2.5 L of supplemental oxygen, iron deficiency anemia on chronic transfusions, HFpEF, type 2 diabetes, hypertension, hyperlipidemia, hypothyroidism, OSA, mild aortic stenosis, who presents to the ED due to altered mental status. History obtained from patient's daughter at bedside, [name], due to patient's altered mental status.  Approximately 10 days ago, patient's lethargy and fatigue have become significantly worse.  [name] states that patient would regularly fall asleep throughout the day.  In addition, patient felt generally weak.  They believe that it may be due to her anemia and had a follow-up with her oncologist.  She had a iron transfusion on 4/17 and blood transfusion on 4/18.  Despite this, lethargy continued to worsen.  In addition, patient had a fever on 4/21 in 4/22.  They went to an oncology follow-up today and was sent to the ED.    ED workup: Hypoxic respiratory failure, O2 sats dropped to 73%, patient was placed on oxygen and BiPAP in the ED Leukocytosis, WBC count elevated CT head negative for any acute findings, CXR demonstrated cardiomegaly with vascular congestion.  [facility] hospitalist consulted for admission and further management as below.  Assessment and Plan:  # Metabolic encephalopathy, multifactorial could be due to hypoxic and hypercapnic respiratory failure, UTI and A-fib with RVR.  Encephalopathy resolved, patient is back to her baseline. Continue supportive care and management as below.  # Acute hypoxic and hypercapnic respiratory failure most likely COPD exacerbation and OSA, hypoventilation syndrome due  to obesity ABG reviewed, improved after BiPAP S/p BiPAP overnight, use BiPAP as needed S/p Solu-Medrol 125 mg x 1 dose given, s/p prednisone 40 mg p.o.d x 4 days 4/27 still mild wheezing noticed, started prednisone 30 mg for 3 days, 20 mg for 3 days and 10 mg for 3 days Resumed Brovana nebulizer twice daily, continue Pulmicort nebulizer twice daily,  DuoNeb every 6 hourly scheduled, changed to as needed after improvement Continue doxycycline 100 mg IV twice daily prophylaxis for atypical infection Patient was advised to follow with pulmonology as an outpatient, patient may need BiPAP for home use versus sleep study and CPAP.  # Transient paroxysmal A-fib with RVR, resolved, converted back to normal sinus rhythm S/p amnio bolus was given, IV infusion was discontinued. Started  amiodarone 200 mg p.o. BID S/p Lopressor 5 mg IV given,  Patient is not a good candidate for anticoagulation due to risk of fall, anemia requiring transfusions. 4/25 started bisoprolol 5 mg Continue to monitor on telemetry,  Cardiology consult appreciated, recommended Zio patch, which will be sent to her house. Continue amiodarone 200 mg twice daily for 2 weeks followed by 200 mg p.o. daily.  And continue bisoprolol 5 mg p.o. daily  Chronic diastolic heart failure BNP 245, slightly elevated S/p Lasix 40 mg IV daily, Held torsemide 20 mg home dose for now As per cardiology resume torsemide 20 mg on discharge. Monitor intake output and renal functions  UTI, UA positive, urine culture growing E. coli, sensitive to ceftriaxone, cefazolin and ciprofloxacin. Continue ceftriaxone 1 g IV daily We will plan to discharge on Keflex  Anemia of chronic disease and iron deficiency Patient  follows hematologist as an outpatient and getting iron infusions as an outpatient. Hb 7.6, transfuse 1 unit of PRBC due to high risk for symptomatic anemia and prevent rehospitalization. 4/27 Hb 8.7, stable iron profile, iron level 43 and  transferrin saturation 15%, within normal range Recommend to continue oral iron supplement and follow with hematologist as an outpatient. On 06/16/22 Vitamin B12 level was 303, goal >400, vitamin B12 1000 mcg IM injection daily x 3 doses ordered followed by oral supplement.  NIDDM T2, held home medications for now Continue NovoLog sliding scale, monitor CBG,  Depression, continue Prozac Hypothyroid, continue Synthroid  Possible Hallucinations 4/27 patient stated that she is seeing different people and hearing voices.  It is questionable that she may be communicating in her dreams.  Patient's daughter thinks that she is not hallucinating, and does not want psych consult and does not want any treatment. We will continue supportive care and continue to monitor. Started melatonin 3 mg low-dose, it may help her to have a good sleep.  Body mass index is 38.37 kg/m.  Interventions:       Diet: Heart healthy diet/carb modified diet/fluid restriction 1.5 to 2/day DVT Prophylaxis: Subcutaneous Lovenox   Advance goals of care discussion: Full code  Family Communication: family was not present at bedside, at the time of interview.  The pt provided permission to discuss medical plan with the family. Opportunity was given to ask question and all questions were answered satisfactorily.  4/25, discussed with patient's daughter over the phone  Disposition:  Pt is from Home, admitted with Resp failure, A.fib and UTI, still feels mild shortness of breath, ambulatory dysfunction, awaiting for PT and OT eval, which precludes a safe discharge. Discharge to Home, when clinically stable, most likely discharge in 1-2 days   Subjective: Patient stated that she did not sleep well last night as patient was talking to people and seeing some people so she got to stop.  Patient denies hallucinations, as she is talking may be in the sleep while dreaming.  Patient's daughter thinks she is not hallucinating.  We  will continue to monitor.  Patient's daughter does not want psych consult and no treatment for that. Patient feels mild shortness of breath, still has not ambulated with PT so we will continue to monitor and continue current treatment.  Plan for disposition after ambulation.  Physical Exam: General: NAD, mild SOB, sitting comfortably in the recliner Appear in no distress, affect appropriate Eyes: PERRLA ENT: Oral Mucosa Clear, dry Neck: no JVD,  Cardiovascular: S1 and S2 Present, no Murmur,  Respiratory: Equal air entry bilaterally, mild crackles, Mild wheezing. Abdomen: Bowel Sound present, Soft and no tenderness,  Skin: no rashes Extremities: no Pedal edema, no calf tenderness Neurologic: without any new focal findings Gait not checked due to patient safety concerns  Vitals:   07/03/22 0039 07/03/22 0714 07/03/22 0732 07/03/22 0751  BP: (!) 153/47   (!) 135/51  Pulse: 64   (!) 59  Resp: 20   16  Temp: 98.7 F (37.1 C)   97.9 F (36.6 C)  TempSrc: Oral     SpO2: 97%  95% 100%  Weight:  73.5 kg    Height:        Intake/Output Summary (Last 24 hours) at 07/03/2022 1419 Last data filed at 07/03/2022 0752 Gross per 24 hour  Intake 913.11 ml  Output 700 ml  Net 213.11 ml   Filed Weights   07/01/22 0500 07/02/22 0500 07/03/22 0714  Weight:  74.8 kg 75.2 kg 73.5 kg    Data Reviewed: I have personally reviewed and interpreted daily labs, tele strips, imagings as discussed above. I reviewed all nursing notes, pharmacy notes, vitals, pertinent old records I have discussed plan of care as described above with RN and patient/family.  CBC: Recent Labs  Lab 06/29/22 1302 06/29/22 1437 06/30/22 0631 07/01/22 0611 07/02/22 0603 07/03/22 0629  WBC 12.5* 12.3* 8.3 9.5 5.8 6.7  NEUTROABS 11.3* 11.0* 8.1*  --   --   --   HGB 7.7* 8.1* 8.3* 8.1* 7.6* 8.7*  HCT 26.7* 28.4* 29.8* 28.3* 26.0* 29.6*  MCV 100.4* 101.1* 103.8* 99.6 98.5 92.8  PLT 125* 136* 131* 168 202 239   Basic  Metabolic Panel: Recent Labs  Lab 06/29/22 1437 06/30/22 0631 07/01/22 0611 07/02/22 0603 07/03/22 0629  NA 136 141 140 144 141  K 4.6 4.7 4.2 4.0 4.1  CL 95* 99 99 101 102  CO2 38* 34* 33* 35* 32  GLUCOSE 130* 195* 129* 120* 105*  BUN 33* 35* 47* 48* 40*  CREATININE 1.36* 1.33* 1.32* 1.21* 1.11*  CALCIUM 7.8* 8.2* 8.5* 8.4* 8.6*  MG  --  2.2 2.2 2.1 2.2  PHOS  --   --  2.8 2.7 3.3    Studies: No results found.  Scheduled Meds:  amiodarone  200 mg Oral BID   arformoterol  15 mcg Nebulization BID   bisoprolol  5 mg Oral Daily   budesonide  0.5 mg Nebulization BID   [START ON 07/04/2022] cyanocobalamin  1,000 mcg Intramuscular Once   [START ON 07/05/2022] vitamin B-12  1,000 mcg Oral Daily   enoxaparin (LOVENOX) injection  40 mg Subcutaneous Q24H   ezetimibe  10 mg Oral QHS   FLUoxetine  20 mg Oral BID   insulin aspart  0-9 Units Subcutaneous TID WC   ipratropium-albuterol  3 mL Nebulization BID   levothyroxine  125 mcg Oral Q0600   predniSONE  40 mg Oral Q breakfast   sodium chloride flush  3 mL Intravenous Q12H   Continuous Infusions:  cefTRIAXone (ROCEPHIN)  IV 2 g (07/03/22 1343)   doxycycline (VIBRAMYCIN) IV 100 mg (07/03/22 0935)   PRN Meds: acetaminophen **OR** acetaminophen, metoprolol tartrate, ondansetron **OR** ondansetron (ZOFRAN) IV, polyethylene glycol  Time spent: 50 minutes  Author: Gillis Santa. MD [facility] 07/03/2022 2:19 PM  To reach On-call, see care teams to locate the attending and reach out to them via [email or web address]. If 7PM-7AM, please contact night-coverage If you still have difficulty reaching the attending provider, please page the [facility] (Director on Call) for [facility] on amion for assistance.

## 2022-07-03 NOTE — Plan of Care (Signed)
  Problem: Education: Goal: Ability to demonstrate management of disease process will improve Outcome: Progressing   Problem: Activity: Goal: Capacity to carry out activities will improve Outcome: Progressing   Problem: Cardiac: Goal: Ability to achieve and maintain adequate cardiopulmonary perfusion will improve Outcome: Progressing   Problem: Education: Goal: Knowledge of disease or condition will improve Outcome: Progressing   Problem: Activity: Goal: Ability to tolerate increased activity will improve Outcome: Progressing   Problem: Respiratory: Goal: Ability to maintain a clear airway will improve Outcome: Progressing   Problem: Education: Goal: Ability to describe self-care measures that may prevent or decrease complications (Diabetes Survival Skills Education) will improve Outcome: Progressing

## 2022-07-03 NOTE — Progress Notes (Signed)
Physical Therapy Treatment Patient Details Name: Alexandria [name] MRN: [id number] DOB: 1935-05-09 Today's Date: 07/03/2022   History of Present Illness 87 y.o. female with a h/o chronic HFrEF, coronary calcium, lymphedema, HTN, HL, COPD on home O2, Dm2, CKD stage 3, mild AS, morbid obesity, IDA req IV rion and PRMCs, OSA, severe deconditioning, RUL lung cancer and depression, presented for AMS admitted for Afib RVR, UTI.    PT Comments    Pt received in bed, caregiver at bedside. Discussed POC and role of PT. Pt on 3L O2 [place] upon arrival. Increased SOB during transfer supine to sit with CGA. Pt completed gait training in room, to/from bathroom with Rollator (pt uses at home) and CG/S. Pt only able to tolerate ~39ft of ambulation prior to requiring seated rest break due to significant fatigue and SOB, 93% on 3L O2. Pt later decreased to 2.5L with resting SpO2 99%. Pt educated on energy conservation techniques, PLB, LE HEP, and hypercapnia concerns if too much oxygen is given. Pt states she feels more SOB and increased difficulty regaining her breath compared to baseline. Will continue PT per POC until cleared for d/c.   Recommendations for follow up therapy are one component of a multi-disciplinary discharge planning process, led by the attending physician.  Recommendations may be updated based on patient status, additional functional criteria and insurance authorization.  Follow Up Recommendations       Assistance Recommended at Discharge Frequent or constant Supervision/Assistance  Patient can return home with the following A little help with walking and/or transfers;Assistance with cooking/housework;Direct supervision/assist for medications management;Assist for transportation;A little help with bathing/dressing/bathroom;Help with stairs or ramp for entrance   Equipment Recommendations  Other (comment) (Pt has a ROllator at home)    Recommendations for Other Services       Precautions /  Restrictions Precautions Precautions: Fall Restrictions Weight Bearing Restrictions: No     Mobility  Bed Mobility Overal bed mobility: Needs Assistance Bed Mobility: Sit to Supine     Supine to sit: Min guard Sit to supine: Supervision   General bed mobility comments: CGA  for trunk elevation, extended time, bed rails    Transfers Overall transfer level: Needs assistance Equipment used: Rolling walker (2 wheels) Transfers: Sit to/from Stand Sit to Stand: Min guard (from bed)           General transfer comment:  (ModA to raise from low toilet seat)    Ambulation/Gait Ambulation/Gait assistance: Min guard Gait Distance (Feet):  (25+) Assistive device: Rollator (4 wheels) Gait Pattern/deviations: Step-through pattern, Decreased step length - right, Decreased step length - left Gait velocity:  (decreased)     General Gait Details:  (DOE with minimal activity on 3L O2, SpO2 at 93% despite SOB)   Stairs             Wheelchair Mobility    Modified Rankin (Stroke Patients Only)       Balance Overall balance assessment: Needs assistance Sitting-balance support: Feet supported Sitting balance-[name]: Good     Standing balance support: Reliant on assistive device for balance, During functional activity, Bilateral upper extremity supported Standing balance-[name]: Fair                              Cognition Arousal/Alertness: Awake/alert Behavior During Therapy: WFL for tasks assessed/performed Overall Cognitive Status: Within Functional Limits for tasks assessed  General Comments:  (Very pleasant and cooperative)        Exercises      General Comments General comments (skin integrity, edema, etc.):  (Pt's caregiver in room. Education provided verbally and with visual handouts regarding Energy Conservation techniques, benefits of PLB technique and Acapella to clear airway, as well  as B LE HEP with good understanding)      Pertinent Vitals/Pain Pain Assessment Pain Assessment: No/denies pain    Home Living                          Prior Function            PT Goals (current goals can now be found in the care plan section) Acute Rehab PT Goals Patient Stated Goal: to get stronger    Frequency    Min 4X/week      PT Plan Current plan remains appropriate    Co-evaluation              AM-PAC PT "6 Clicks" Mobility   Outcome Measure  Help needed turning from your back to your side while in a flat bed without using bedrails?: A Little Help needed moving from lying on your back to sitting on the side of a flat bed without using bedrails?: A Little Help needed moving to and from a bed to a chair (including a wheelchair)?: A Little Help needed standing up from a chair using your arms (e.g., wheelchair or bedside chair)?: A Little Help needed to walk in hospital room?: A Little Help needed climbing 3-5 steps with a railing? : Total 6 Click Score: 16    End of Session Equipment Utilized During Treatment: Gait belt;Oxygen Activity Tolerance: Patient tolerated treatment well Patient left: in chair;with call bell/phone within reach;with family/visitor present Nurse Communication: Mobility status PT Visit Diagnosis: Other abnormalities of gait and mobility (R26.89);Muscle weakness (generalized) (M62.81)     Time: 6045-4098 PT Time Calculation (min) (ACUTE ONLY): 46 min  Charges:  $Gait Training: 8-22 mins $Therapeutic Exercise: 8-22 mins $Therapeutic Activity: 8-22 mins                    Zadie Cleverly, PTA  Jannet Askew 07/03/2022, 1:18 PM

## 2022-07-04 DIAGNOSIS — J9622 Acute and chronic respiratory failure with hypercapnia: Secondary | ICD-10-CM | POA: Diagnosis not present

## 2022-07-04 DIAGNOSIS — J9621 Acute and chronic respiratory failure with hypoxia: Secondary | ICD-10-CM | POA: Diagnosis not present

## 2022-07-04 LAB — TYPE AND SCREEN
ABO/RH(D): O NEG
Donor AG Type: NEGATIVE
Unit division: 0
Unit division: 0
Unit division: 0

## 2022-07-04 LAB — CBC
HCT: 29.6 % — ABNORMAL LOW (ref 36.0–46.0)
Hemoglobin: 8.4 g/dL — ABNORMAL LOW (ref 12.0–15.0)
MCH: 26.8 pg (ref 26.0–34.0)
MCHC: 28.4 g/dL — ABNORMAL LOW (ref 30.0–36.0)
MCV: 94.3 fL (ref 80.0–100.0)
Platelets: 278 10*3/uL (ref 150–400)
RBC: 3.14 MIL/uL — ABNORMAL LOW (ref 3.87–5.11)
RDW: 22.9 % — ABNORMAL HIGH (ref 11.5–15.5)
WBC: 7.7 10*3/uL (ref 4.0–10.5)
nRBC: 0.3 % — ABNORMAL HIGH (ref 0.0–0.2)

## 2022-07-04 LAB — GLUCOSE, CAPILLARY
Glucose-Capillary: 96 mg/dL (ref 70–99)
Glucose-Capillary: 141 mg/dL — ABNORMAL HIGH (ref 70–99)
Glucose-Capillary: 234 mg/dL — ABNORMAL HIGH (ref 70–99)
Glucose-Capillary: 178 mg/dL — ABNORMAL HIGH (ref 70–99)

## 2022-07-04 LAB — BASIC METABOLIC PANEL
Anion gap: 5 (ref 5–15)
BUN: 40 mg/dL — ABNORMAL HIGH (ref 8–23)
CO2: 32 mmol/L (ref 22–32)
Calcium: 8.9 mg/dL (ref 8.9–10.3)
Chloride: 105 mmol/L (ref 98–111)
Creatinine, Ser: 1.1 mg/dL — ABNORMAL HIGH (ref 0.44–1.00)
GFR, Estimated: 50 mL/min — ABNORMAL LOW (ref >60)
Glucose, Bld: 98 mg/dL (ref 70–99)
Potassium: 4.3 mmol/L (ref 3.5–5.1)
Sodium: 142 mmol/L (ref 135–145)

## 2022-07-04 LAB — CULTURE, BLOOD (ROUTINE X 2): Special Requests: ADEQUATE

## 2022-07-04 LAB — BPAM RBC: ISSUE DATE / TIME: 202404261644

## 2022-07-04 MED ORDER — TORSEMIDE 20 MG PO TABS
20.0000 mg | ORAL_TABLET | Freq: Every day | ORAL | Status: DC
  Administered 2022-07-04 – 2022-07-06 (×3): 20 mg via ORAL
  Filled 2022-07-04 (×3): qty 1

## 2022-07-04 NOTE — Progress Notes (Signed)
[facility] Progress Note  Patient: Alexandria [name]    ZOX:[id number]  DOA: 06/29/2022     Date of Service: the patient was seen and examined on 07/04/2022  Chief Complaint  Patient presents with   Altered Mental Status   Brief hospital course: Alexandria [name] is a 87 y.o. female with medical history significant of COPD with chronic hypoxic respiratory failure on 2.5 L of supplemental oxygen, iron deficiency anemia on chronic transfusions, HFpEF, type 2 diabetes, hypertension, hyperlipidemia, hypothyroidism, OSA, mild aortic stenosis, who presents to the ED due to altered mental status. History obtained from patient's daughter at bedside, Alexandria [name], due to patient's altered mental status.  Approximately 10 days ago, patient's lethargy and fatigue have become significantly worse.  Alexandria [name] states that patient would regularly fall asleep throughout the day.  In addition, patient felt generally weak.  They believe that it may be due to her anemia and had a follow-up with her oncologist.  She had a iron transfusion on 4/17 and blood transfusion on 4/18.  Despite this, lethargy continued to worsen.  In addition, patient had a fever on 4/21 in 4/22.  They went to an oncology follow-up today and was sent to the ED.    ED workup: Hypoxic respiratory failure, O2 sats dropped to 73%, patient was placed on oxygen and BiPAP in the ED Leukocytosis, WBC count elevated CT head negative for any acute findings, CXR demonstrated cardiomegaly with vascular congestion.  [facility] hospitalist consulted for admission and further management as below.  Assessment and Plan:  # Metabolic encephalopathy, multifactorial could be due to hypoxic and hypercapnic respiratory failure, UTI and A-fib with RVR.  Encephalopathy resolved, patient is back to her baseline. Continue supportive care and management as below. 4/27 patient mentioned about seeing and talking to the people who do not exist.  It is possible that patient might be  having hallucination which is causing insomnia and encephalopathy.     # Acute hypoxic and hypercapnic respiratory failure most likely COPD exacerbation and OSA, hypoventilation syndrome due to obesity ABG reviewed, improved after BiPAP S/p BiPAP overnight, use BiPAP as needed S/p Solu-Medrol 125 mg x 1 dose given, s/p prednisone 40 mg p.o.d x 4 days 4/27 still mild wheezing noticed, started prednisone 30 mg for 3 days, 20 mg for 3 days and 10 mg for 3 days Resumed Brovana nebulizer twice daily, continue Pulmicort nebulizer twice daily,  DuoNeb every 6 hourly scheduled, changed to as needed after improvement Continue doxycycline 100 mg IV twice daily prophylaxis for atypical infection Patient was advised to follow with pulmonology as an outpatient, patient may need BiPAP for home use versus sleep study and CPAP.  # Transient paroxysmal A-fib with RVR, resolved, converted back to normal sinus rhythm S/p amnio bolus was given, IV infusion was discontinued. Started  amiodarone 200 mg p.o. BID S/p Lopressor 5 mg IV given,  Patient is not a good candidate for anticoagulation due to risk of fall, anemia requiring transfusions. 4/25 started bisoprolol 5 mg Continue to monitor on telemetry,  Cardiology consult appreciated, recommended Zio patch, which will be sent to her house. Continue amiodarone 200 mg twice daily for 2 weeks followed by 200 mg p.o. daily.  And continue bisoprolol 5 mg p.o. daily  # Chronic diastolic heart failure BNP 245, slightly elevated S/p Lasix 40 mg IV daily, Held torsemide 20 mg home dose for now As per cardiology resume torsemide 20 mg on discharge. 4/28 resumed torsemide 20 mg p.o. daily home dose  today Monitor intake output and renal functions  # UTI, UA positive, urine culture growing E. coli, sensitive to ceftriaxone, cefazolin and ciprofloxacin. Continue ceftriaxone 1 g IV daily We will plan to discharge on Keflex  # Anemia of chronic disease and iron  deficiency Patient follows hematologist as an outpatient and getting iron infusions as an outpatient. Hb 7.6, transfuse 1 unit of PRBC due to high risk for symptomatic anemia and prevent rehospitalization. 4/27 Hb 8.7, stable iron profile, iron level 43 and transferrin saturation 15%, within normal range Recommend to continue oral iron supplement and follow with hematologist as an outpatient. On 06/16/22 Vitamin B12 level was 303, goal >400, vitamin B12 1000 mcg IM injection daily x 3 doses ordered followed by oral supplement.  # NIDDM T2, held home medications for now Continue NovoLog sliding scale, monitor CBG,  # Depression, continue Prozac # Hypothyroid, continue Synthroid  # Possible Hallucinations 4/27 patient stated that she is seeing different people and hearing voices.  It is questionable that she may be communicating in her dreams.  Patient's daughter thinks that she is not hallucinating, and does not want psych consult and does not want any treatment. We will continue supportive care and continue to monitor. Started melatonin 3 mg low-dose, it may help her to have a good sleep. 4/28 patient did not sleep properly last night due to hallucinations.  Does not want psych consult and does not want any medications for that.  Discussed with patient's daughter as well who does not want to involve psych at this time.  Body mass index is 38.37 kg/m.  Interventions:       Diet: Heart healthy diet/carb modified diet/fluid restriction 1.5 to 2/day DVT Prophylaxis: Subcutaneous Lovenox   Advance goals of care discussion: Full code  Family Communication: family was not present at bedside, at the time of interview.  The pt provided permission to discuss medical plan with the family. Opportunity was given to ask question and all questions were answered satisfactorily.  4/25, discussed with patient's daughter over the phone  Disposition:  Pt is from Home, admitted with Resp failure,  A.fib and UTI, still feels mild shortness of breath, ambulatory dysfunction, PT and OT eval done, recommended acute rehab, which precludes a safe discharge. Patient is having hallucinations visual and auditory, does not want psych consult and does not want treatment.  Patient's daughter is also refusing psych consult and psych treatment. Discharge to SNF vs acute rehab vs Home with [facility] TBD what ever patient and her daughter decide.  TOC consulted for placement and discharge planning.   Subjective: Overnight patient could not sleep due to hallucinations, stated that breathing is fine.  Patient is still having significant ambulatory problem, O2 sats dropped to 93% while working with physical therapy yesterday.  PT recommended acute rehab, management plan discussed with patient's daughter over the phone. We will continue to monitor and discharge when bed will be available.  Physical Exam: General: NAD, mild SOB, sitting comfortably in the recliner Appear in no distress, affect appropriate Eyes: PERRLA ENT: Oral Mucosa Clear, dry Neck: no JVD,  Cardiovascular: S1 and S2 Present, no Murmur,  Respiratory: Equal air entry bilaterally, mild crackles, Mild wheezing. Abdomen: Bowel Sound present, Soft and no tenderness,  Skin: no rashes Extremities: no Pedal edema, no calf tenderness Neurologic: without any new focal findings Gait not checked due to patient safety concerns  Vitals:   07/03/22 1956 07/03/22 2317 07/04/22 0758 07/04/22 1007  BP:  (!) 132/55  104/80  Pulse: 60 (!) 56  (!) 57  Resp: 18 16  16   Temp:  97.6 F (36.4 C)  98.1 F (36.7 C)  TempSrc:      SpO2: 97% 95% 93% 99%  Weight:      Height:        Intake/Output Summary (Last 24 hours) at 07/04/2022 1211 Last data filed at 07/04/2022 1147 Gross per 24 hour  Intake 970 ml  Output 900 ml  Net 70 ml   Filed Weights   07/01/22 0500 07/02/22 0500 07/03/22 0714  Weight: 74.8 kg 75.2 kg 73.5 kg    Data Reviewed: I have  personally reviewed and interpreted daily labs, tele strips, imagings as discussed above. I reviewed all nursing notes, pharmacy notes, vitals, pertinent old records I have discussed plan of care as described above with RN and patient/family.  CBC: Recent Labs  Lab 06/29/22 1302 06/29/22 1437 06/30/22 0631 07/01/22 0611 07/02/22 0603 07/03/22 0629 07/04/22 0553  WBC 12.5* 12.3* 8.3 9.5 5.8 6.7 7.7  NEUTROABS 11.3* 11.0* 8.1*  --   --   --   --   HGB 7.7* 8.1* 8.3* 8.1* 7.6* 8.7* 8.4*  HCT 26.7* 28.4* 29.8* 28.3* 26.0* 29.6* 29.6*  MCV 100.4* 101.1* 103.8* 99.6 98.5 92.8 94.3  PLT 125* 136* 131* 168 202 239 278   Basic Metabolic Panel: Recent Labs  Lab 06/30/22 0631 07/01/22 0611 07/02/22 0603 07/03/22 0629 07/04/22 0553  NA 141 140 144 141 142  K 4.7 4.2 4.0 4.1 4.3  CL 99 99 101 102 105  CO2 34* 33* 35* 32 32  GLUCOSE 195* 129* 120* 105* 98  BUN 35* 47* 48* 40* 40*  CREATININE 1.33* 1.32* 1.21* 1.11* 1.10*  CALCIUM 8.2* 8.5* 8.4* 8.6* 8.9  MG 2.2 2.2 2.1 2.2  --   PHOS  --  2.8 2.7 3.3  --     Studies: No results found.  Scheduled Meds:  amiodarone  200 mg Oral BID   arformoterol  15 mcg Nebulization BID   bisoprolol  5 mg Oral Daily   budesonide  0.5 mg Nebulization BID   [START ON 07/05/2022] vitamin B-12  1,000 mcg Oral Daily   enoxaparin (LOVENOX) injection  40 mg Subcutaneous Q24H   ezetimibe  10 mg Oral QHS   FLUoxetine  20 mg Oral BID   insulin aspart  0-9 Units Subcutaneous TID WC   ipratropium-albuterol  3 mL Nebulization BID   levothyroxine  125 mcg Oral Q0600   melatonin  2.5 mg Oral QHS   predniSONE  30 mg Oral Q breakfast   Followed by   Melene Muller ON 07/07/2022] predniSONE  20 mg Oral Q breakfast   Followed by   Melene Muller ON 07/10/2022] predniSONE  10 mg Oral Q breakfast   sodium chloride flush  3 mL Intravenous Q12H   torsemide  20 mg Oral Daily   Continuous Infusions:  cefTRIAXone (ROCEPHIN)  IV Stopped (07/03/22 1413)   doxycycline (VIBRAMYCIN)  IV 100 mg (07/04/22 1012)   PRN Meds: acetaminophen **OR** acetaminophen, metoprolol tartrate, ondansetron **OR** ondansetron (ZOFRAN) IV, polyethylene glycol  Time spent: 50 minutes  Author: Gillis Santa. MD [facility] 07/04/2022 12:11 PM  To reach On-call, see care teams to locate the attending and reach out to them via [email or web address]. If 7PM-7AM, please contact night-coverage If you still have difficulty reaching the attending provider, please page the [facility] (Director on Call) for [facility] on amion for assistance.

## 2022-07-04 NOTE — Plan of Care (Signed)
  Problem: Education: Goal: Ability to demonstrate management of disease process will improve Outcome: Progressing Goal: Ability to verbalize understanding of medication therapies will improve Outcome: Progressing   Problem: Activity: Goal: Capacity to carry out activities will improve Outcome: Progressing   Problem: Cardiac: Goal: Ability to achieve and maintain adequate cardiopulmonary perfusion will improve Outcome: Progressing   Problem: Respiratory: Goal: Ability to maintain a clear airway will improve Outcome: Progressing   Problem: Coping: Goal: Ability to adjust to condition or change in health will improve Outcome: Progressing   Problem: Metabolic: Goal: Ability to maintain appropriate glucose levels will improve Outcome: Progressing

## 2022-07-04 NOTE — Progress Notes (Signed)
Occupational Therapy Treatment Patient Details Name: Alexandria [name] MRN: [id number] DOB: [birth year] Today's Date: 07/04/2022   History of present illness 87 y.o. female with a h/o chronic HFrEF, coronary calcium, lymphedema, HTN, HL, COPD on home O2, Dm2, CKD stage 3, mild AS, morbid obesity, IDA req IV rion and PRMCs, OSA, severe deconditioning, RUL lung cancer and depression, presented for AMS admitted for Afib RVR, UTI.   OT comments  Patient received sitting up in recliner and agreeable to OT. Daughter present. OT tx session focused on energy conservation techniques to improve activity tolerance and safety with ADLs. Education included the 4 P's (plan, prioritize, pace, position) of energy conservation, pursed lip breathing, and specific strategies for ADLs (toileting, grooming, bathing/showering, dressing). OT discussed specific examples of sitting when possible to complete ADL tasks (I.e. dressing, bathing, meal prep), gathering all the necessary items in advance for a certain task, avoiding excessive bending/reaching, and taking rest breaks before you feel tired. Pt will benefit from further opportunities to practice implementing energy conservation techniques during self-care tasks before returning home. Pt was also instructed in BUE AROM exercises to complete outside of therapy while sitting up in recliner/bed for improved ROM/strength. Pt/daughter verbalized understanding of all education provided. Pt is making progress toward goal completion. D/C recommendation remains appropriate. OT will continue to follow acutely.   Recommendations for follow up therapy are one component of a multi-disciplinary discharge planning process, led by the attending physician.  Recommendations may be updated based on patient status, additional functional criteria and insurance authorization.    Assistance Recommended at Discharge Intermittent Supervision/Assistance  Patient can return home with the following  A  little help with walking and/or transfers;Help with stairs or ramp for entrance;A lot of help with bathing/dressing/bathroom;Assistance with cooking/housework;Assist for transportation   Equipment Recommendations  None recommended by OT    Recommendations for Other Services      Precautions / Restrictions Precautions Precautions: Fall Restrictions Weight Bearing Restrictions: No       Mobility Bed Mobility Overal bed mobility: Needs Assistance             General bed mobility comments: pt received/left in recliner    Transfers Overall transfer level: Needs assistance                 General transfer comment: pt deferred 2/2 fatigue     Balance Overall balance assessment: Needs assistance Sitting-balance support: Feet supported Sitting balance-[name]: Good         ADL either performed or assessed with clinical judgement   ADL Overall ADL's : Needs assistance/impaired       General ADL Comments: Pt deferred all self-care tasks this date, she reported just completing functional mobility to the bathroom with NT and required assistance for peri care.    Extremity/Trunk Assessment Upper Extremity Assessment Upper Extremity Assessment: Generalized weakness   Lower Extremity Assessment Lower Extremity Assessment: Generalized weakness   Cervical / Trunk Assessment Cervical / Trunk Assessment: Normal    Vision Baseline Vision/History: 1 Wears glasses Patient Visual Report: No change from baseline     Perception     Praxis      Cognition Arousal/Alertness: Awake/alert Behavior During Therapy: WFL for tasks assessed/performed Overall Cognitive Status: Within Functional Limits for tasks assessed          Exercises Other Exercises Other Exercises: Education provided re: energy conservation techniques with handout provided Other Exercises: Education provided re: BUE AROM exercises to complete outside of therapy while sitting  up in recliner/bed  for improved ROM/strength    Shoulder Instructions       General Comments      Pertinent Vitals/ Pain       Pain Assessment Pain Assessment: No/denies pain  Home Living              Prior Functioning/Environment              Frequency  Min 2X/week        Progress Toward Goals  OT Goals(current goals can now be found in the care plan section)  Progress towards OT goals: Progressing toward goals  Acute Rehab OT Goals Patient Stated Goal: to go home OT Goal Formulation: With patient Time For Goal Achievement: 07/15/22 Potential to Achieve Goals: Good  Plan Discharge plan remains appropriate;Frequency remains appropriate    Co-evaluation                 AM-PAC OT "6 Clicks" Daily Activity     Outcome Measure   Help from another person eating meals?: None Help from another person taking care of personal grooming?: A Little Help from another person toileting, which includes using toliet, bedpan, or urinal?: A Little Help from another person bathing (including washing, rinsing, drying)?: A Lot Help from another person to put on and taking off regular upper body clothing?: A Little Help from another person to put on and taking off regular lower body clothing?: A Lot 6 Click Score: 17    End of Session    OT Visit Diagnosis: Other abnormalities of gait and mobility (R26.89);Muscle weakness (generalized) (M62.81)   Activity Tolerance Patient limited by fatigue;Patient tolerated treatment well   Patient Left in chair;with call bell/phone within reach;with chair alarm set;with family/visitor present   Nurse Communication Mobility status        Time: 1610-9604 OT Time Calculation (min): 10 min  Charges: OT General Charges $OT Visit: 1 Visit OT Treatments $Therapeutic Activity: 8-22 mins  [facility] MS, OTR/L ascom [phone]  07/04/22, 4:53 PM

## 2022-07-05 ENCOUNTER — Inpatient Hospital Stay: Payer: Medicare Other

## 2022-07-05 ENCOUNTER — Ambulatory Visit: Payer: Medicare Other | Admitting: Family Medicine

## 2022-07-05 DIAGNOSIS — J9621 Acute and chronic respiratory failure with hypoxia: Secondary | ICD-10-CM | POA: Diagnosis not present

## 2022-07-05 DIAGNOSIS — J9622 Acute and chronic respiratory failure with hypercapnia: Secondary | ICD-10-CM | POA: Diagnosis not present

## 2022-07-05 LAB — BASIC METABOLIC PANEL
Anion gap: 8 (ref 5–15)
BUN: 41 mg/dL — ABNORMAL HIGH (ref 8–23)
CO2: 34 mmol/L — ABNORMAL HIGH (ref 22–32)
Calcium: 8.7 mg/dL — ABNORMAL LOW (ref 8.9–10.3)
Chloride: 101 mmol/L (ref 98–111)
Creatinine, Ser: 1.18 mg/dL — ABNORMAL HIGH (ref 0.44–1.00)
GFR, Estimated: 46 mL/min — ABNORMAL LOW (ref >60)
Glucose, Bld: 107 mg/dL — ABNORMAL HIGH (ref 70–99)
Potassium: 3.9 mmol/L (ref 3.5–5.1)
Sodium: 143 mmol/L (ref 135–145)

## 2022-07-05 LAB — CBC
HCT: 30.3 % — ABNORMAL LOW (ref 36.0–46.0)
Hemoglobin: 9 g/dL — ABNORMAL LOW (ref 12.0–15.0)
MCH: 27.7 pg (ref 26.0–34.0)
MCHC: 29.7 g/dL — ABNORMAL LOW (ref 30.0–36.0)
MCV: 93.2 fL (ref 80.0–100.0)
Platelets: 278 10*3/uL (ref 150–400)
RBC: 3.25 MIL/uL — ABNORMAL LOW (ref 3.87–5.11)
RDW: 22.4 % — ABNORMAL HIGH (ref 11.5–15.5)
WBC: 6.6 10*3/uL (ref 4.0–10.5)
nRBC: 0 % (ref 0.0–0.2)

## 2022-07-05 LAB — GLUCOSE, CAPILLARY
Glucose-Capillary: 89 mg/dL (ref 70–99)
Glucose-Capillary: 126 mg/dL — ABNORMAL HIGH (ref 70–99)
Glucose-Capillary: 267 mg/dL — ABNORMAL HIGH (ref 70–99)
Glucose-Capillary: 160 mg/dL — ABNORMAL HIGH (ref 70–99)

## 2022-07-05 NOTE — Progress Notes (Signed)
[facility] Progress Note  Patient: Alexandria [name]    ZOX:[id number]  DOA: 06/29/2022     Date of Service: the patient was seen and examined on 07/05/2022  Chief Complaint  Patient presents with   Altered Mental Status   Brief hospital course: Alexandria [name] is a 87 y.o. female with medical history significant of COPD with chronic hypoxic respiratory failure on 2.5 L of supplemental oxygen, iron deficiency anemia on chronic transfusions, HFpEF, type 2 diabetes, hypertension, hyperlipidemia, hypothyroidism, OSA, mild aortic stenosis, who presents to the ED due to altered mental status. History obtained from patient's daughter at bedside, Alexandria [name], due to patient's altered mental status.  Approximately 10 days ago, patient's lethargy and fatigue have become significantly worse.  Alexandria [name] states that patient would regularly fall asleep throughout the day.  In addition, patient felt generally weak.  They believe that it may be due to her anemia and had a follow-up with her oncologist.  She had a iron transfusion on 4/17 and blood transfusion on 4/18.  Despite this, lethargy continued to worsen.  In addition, patient had a fever on 4/21 in 4/22.  They went to an oncology follow-up today and was sent to the ED.    ED workup: Hypoxic respiratory failure, O2 sats dropped to 73%, patient was placed on oxygen and BiPAP in the ED Leukocytosis, WBC count elevated CT head negative for any acute findings, CXR demonstrated cardiomegaly with vascular congestion.  [facility] hospitalist consulted for admission and further management as below.  Assessment and Plan:  # Metabolic encephalopathy, multifactorial could be due to hypoxic and hypercapnic respiratory failure, UTI and A-fib with RVR.  Encephalopathy resolved, patient is back to her baseline. Continue supportive care and management as below. 4/27 patient mentioned about seeing and talking to the people who do not exist.  It is possible that patient might be  having hallucination which is causing insomnia and encephalopathy.     # Acute hypoxic and hypercapnic respiratory failure most likely COPD exacerbation and OSA, hypoventilation syndrome due to obesity ABG reviewed, improved after BiPAP S/p BiPAP overnight, use BiPAP as needed S/p Solu-Medrol 125 mg x 1 dose given, s/p prednisone 40 mg p.o.d x 4 days 4/27 still mild wheezing noticed, started prednisone 30 mg for 3 days, 20 mg for 3 days and 10 mg for 3 days Resumed Brovana nebulizer twice daily, continue Pulmicort nebulizer twice daily,  DuoNeb every 6 hourly scheduled, changed to as needed after improvement Continue doxycycline 100 mg IV twice daily prophylaxis for atypical infection Patient was advised to follow with pulmonology as an outpatient, patient may need BiPAP for home use versus sleep study and CPAP.  # Transient paroxysmal A-fib with RVR, resolved, converted back to normal sinus rhythm S/p amnio bolus was given, IV infusion was discontinued. Started  amiodarone 200 mg p.o. BID S/p Lopressor 5 mg IV given,  Patient is not a good candidate for anticoagulation due to risk of fall, anemia requiring transfusions. 4/25 started bisoprolol 5 mg Continue to monitor on telemetry,  Cardiology consult appreciated, recommended Zio patch, which will be sent to her house. Continue amiodarone 200 mg twice daily for 2 weeks followed by 200 mg p.o. daily.  And continue bisoprolol 5 mg p.o. daily  # Chronic diastolic heart failure BNP 245, slightly elevated S/p Lasix 40 mg IV daily, Held torsemide 20 mg home dose for now As per cardiology resume torsemide 20 mg on discharge. 4/28 resumed torsemide 20 mg p.o. daily home dose  today Monitor intake output and renal functions  # UTI, and E. coli bacteremia UA positive, urine culture growing E. coli, sensitive to ceftriaxone, cefazolin and ciprofloxacin. Continue ceftriaxone 2 g IV daily Blood culture positive for E. coli, pansensitive 4/29 d/w  ID recommended total 10 days of antibiotics, can be switched to cefadroxil 1 g BID on discharge 4/29 follow repeat blood cultures  # Anemia of chronic disease and iron deficiency Patient follows hematologist as an outpatient and getting iron infusions as an outpatient. Hb 7.6, transfuse 1 unit of PRBC due to high risk for symptomatic anemia and prevent rehospitalization. 4/29 Hb 9.0, stable iron profile, iron level 43 and transferrin saturation 15%, within normal range Recommend to continue oral iron supplement and follow with hematologist as an outpatient. On 06/16/22 Vitamin B12 level was 303, goal >400, vitamin B12 1000 mcg IM injection daily x 3 doses ordered followed by oral supplement.  # NIDDM T2, held home medications for now Continue NovoLog sliding scale, monitor CBG,  # Depression, continue Prozac # Hypothyroid, continue Synthroid  # Possible Hallucinations 4/27 patient stated that she is seeing different people and hearing voices.  It is questionable that she may be communicating in her dreams.  Patient's daughter thinks that she is not hallucinating, and does not want psych consult and does not want any treatment. We will continue supportive care and continue to monitor. Started melatonin 3 mg low-dose, it may help her to have a good sleep. 4/28 patient did not sleep properly last night due to hallucinations.  Does not want psych consult and does not want any medications for that.  Discussed with patient's daughter as well who does not want to involve psych at this time. 4/29 still having visual and auditory hallucinations, does not want psych consult and does not want any treatment.   Body mass index is 38.37 kg/m.  Interventions:       Diet: Heart healthy diet/carb modified diet/fluid restriction 1.5 to 2/day DVT Prophylaxis: Subcutaneous Lovenox   Advance goals of care discussion: Full code  Family Communication: family was not present at bedside, at the time of  interview.  The pt provided permission to discuss medical plan with the family. Opportunity was given to ask question and all questions were answered satisfactorily.  4/28 discussed with patient's daughter over the phone  Disposition:  Pt is from Home, admitted with Resp failure, A.fib and UTI, still has dyspnea on exertion and desats 84% while working with PT, ambulatory dysfunction, PT and OT eval done, recommended HH, but patient and patient's daughter would like to go SNF, which precludes a safe discharge. Patient is having hallucinations visual and auditory, does not want psych consult and does not want treatment.  Patient's daughter is also refusing psych consult and psych treatment. Discharge to SNF vs Home with HH TBD, TOC needs to follow-up with patient's and her daughter for discharge planning.     Subjective: Still patient is having hallucinations, did not sleep well last night, patient is intermittently confused at night, does not want psych consult and does not want any treatment for hallucinations.  Patient breathing is improving, any chest pain or palpitations, no any other active issues. As per PT patient's O2 sats dropped to 84% on ambulation and patient was very short of breath.   Physical Exam: General: NAD, mild SOB, laying comfortably in the bed Appear in no distress, affect appropriate Eyes: PERRLA ENT: Oral Mucosa Clear, dry Neck: no JVD,  Cardiovascular: S1 and S2  Present, no Murmur,  Respiratory: Equal air entry bilaterally, mild crackles, No wheezing. Abdomen: Bowel Sound present, Soft and no tenderness,  Skin: no rashes Extremities: no Pedal edema, no calf tenderness Neurologic: without any new focal findings Gait not checked due to patient safety concerns  Vitals:   07/05/22 0738 07/05/22 0747 07/05/22 0823 07/05/22 1312  BP:  (!) 154/74 (!) 148/44   Pulse:  62  (!) 57  Resp:  18    Temp:  98.2 F (36.8 C)    TempSrc:  Oral    SpO2: 96% 100%  98%   Weight:      Height:        Intake/Output Summary (Last 24 hours) at 07/05/2022 1405 Last data filed at 07/05/2022 0840 Gross per 24 hour  Intake 720 ml  Output 850 ml  Net -130 ml   Filed Weights   07/02/22 0500 07/03/22 0714 07/05/22 0707  Weight: 75.2 kg 73.5 kg 98.8 kg    Data Reviewed: I have personally reviewed and interpreted daily labs, tele strips, imagings as discussed above. I reviewed all nursing notes, pharmacy notes, vitals, pertinent old records I have discussed plan of care as described above with RN and patient/family.  CBC: Recent Labs  Lab 06/29/22 1302 06/29/22 1437 06/30/22 0631 07/01/22 1610 07/02/22 0603 07/03/22 0629 07/04/22 0553 07/05/22 0632  WBC 12.5* 12.3* 8.3 9.5 5.8 6.7 7.7 6.6  NEUTROABS 11.3* 11.0* 8.1*  --   --   --   --   --   HGB 7.7* 8.1* 8.3* 8.1* 7.6* 8.7* 8.4* 9.0*  HCT 26.7* 28.4* 29.8* 28.3* 26.0* 29.6* 29.6* 30.3*  MCV 100.4* 101.1* 103.8* 99.6 98.5 92.8 94.3 93.2  PLT 125* 136* 131* 168 202 239 278 278   Basic Metabolic Panel: Recent Labs  Lab 06/30/22 0631 07/01/22 0611 07/02/22 0603 07/03/22 0629 07/04/22 0553 07/05/22 0632  NA 141 140 144 141 142 143  K 4.7 4.2 4.0 4.1 4.3 3.9  CL 99 99 101 102 105 101  CO2 34* 33* 35* 32 32 34*  GLUCOSE 195* 129* 120* 105* 98 107*  BUN 35* 47* 48* 40* 40* 41*  CREATININE 1.33* 1.32* 1.21* 1.11* 1.10* 1.18*  CALCIUM 8.2* 8.5* 8.4* 8.6* 8.9 8.7*  MG 2.2 2.2 2.1 2.2  --   --   PHOS  --  2.8 2.7 3.3  --   --     Studies: No results found.  Scheduled Meds:  amiodarone  200 mg Oral BID   arformoterol  15 mcg Nebulization BID   bisoprolol  5 mg Oral Daily   budesonide  0.5 mg Nebulization BID   vitamin B-12  1,000 mcg Oral Daily   enoxaparin (LOVENOX) injection  40 mg Subcutaneous Q24H   ezetimibe  10 mg Oral QHS   FLUoxetine  20 mg Oral BID   insulin aspart  0-9 Units Subcutaneous TID WC   ipratropium-albuterol  3 mL Nebulization BID   levothyroxine  125 mcg Oral Q0600    melatonin  2.5 mg Oral QHS   predniSONE  30 mg Oral Q breakfast   Followed by   Melene Muller ON 07/07/2022] predniSONE  20 mg Oral Q breakfast   Followed by   Melene Muller ON 07/10/2022] predniSONE  10 mg Oral Q breakfast   sodium chloride flush  3 mL Intravenous Q12H   torsemide  20 mg Oral Daily   Continuous Infusions:  cefTRIAXone (ROCEPHIN)  IV 2 g (07/04/22 1445)   doxycycline (VIBRAMYCIN) IV  100 mg (07/05/22 1053)   PRN Meds: acetaminophen **OR** acetaminophen, metoprolol tartrate, ondansetron **OR** ondansetron (ZOFRAN) IV, polyethylene glycol  Time spent: 50 minutes  Author: Gillis Santa. MD [facility] 07/05/2022 2:05 PM  To reach On-call, see care teams to locate the attending and reach out to them via [email or web address]. If 7PM-7AM, please contact night-coverage If you still have difficulty reaching the attending provider, please page the [facility] (Director on Call) for [facility] on amion for assistance.

## 2022-07-05 NOTE — Progress Notes (Signed)
Occupational Therapy Treatment Patient Details Name: Alexandria [name] MRN: [id number] DOB: 1936/01/24 Today's Date: 07/05/2022   History of present illness 87 y.o. female with a h/o chronic HFrEF, coronary calcium, lymphedema, HTN, HL, COPD on home O2, Dm2, CKD stage 3, mild AS, morbid obesity, IDA req IV rion and PRMCs, OSA, severe deconditioning, RUL lung cancer and depression, presented for AMS admitted for Afib RVR, UTI.   OT comments  Pt seen for OT tx. Pt received in bed, caregiver Elease Hashimoto in room. Pt agreeable to session. Pt completed bed mobility with supervision-CGA and increased time/effort and recovery time to complete along with heavy BUE use. Once EOB, pt endorsing 7/10 SOB with the effort and required 5+minutes seated with MOD VC for PLB to recover. HR 55-61 with ADL/mobility, SpO2 97-98% on 3L O2. Pt continued to endorse intermittent SOB throughout session. She also endorsed that she becomes dizzy/lightheaded with positional changes that resolves with time at the end of the session. Care team notified. Pt stood and took steps to the sink with CGA + RW. Pt completed grooming tasks at sink with BUE support on counter, SBA-CGA for safety/balance. Pt and caregiver instructed in ECS including PLB, activity pacing, and home/routines modifications. Pt and caregiver verbalized understanding. Continue to progress POC.    Recommendations for follow up therapy are one component of a multi-disciplinary discharge planning process, led by the attending physician.  Recommendations may be updated based on patient status, additional functional criteria and insurance authorization.    Assistance Recommended at Discharge Frequent or constant Supervision/Assistance  Patient can return home with the following  A little help with walking and/or transfers;Help with stairs or ramp for entrance;A lot of help with bathing/dressing/bathroom;Assistance with cooking/housework;Assist for transportation;Direct  supervision/assist for medications management   Equipment Recommendations  None recommended by OT    Recommendations for Other Services      Precautions / Restrictions Precautions Precautions: Fall Restrictions Weight Bearing Restrictions: No       Mobility Bed Mobility Overal bed mobility: Needs Assistance Bed Mobility: Supine to Sit, Sit to Supine     Supine to sit: Min guard Sit to supine: Supervision   General bed mobility comments: +time/effort, endorsed 7/10 SOB with the effort, heavy use of bed rails    Transfers Overall transfer level: Needs assistance Equipment used: Rolling walker (2 wheels) Transfers: Sit to/from Stand Sit to Stand: Min guard, Min assist           General transfer comment: despite cues for anterior weight shift and hand placement, pt still utilized posterior-anterior weight shifting to help to stand     Balance Overall balance assessment: Needs assistance Sitting-balance support: Feet supported Sitting balance-[name]: Good     Standing balance support: Reliant on assistive device for balance, During functional activity, Bilateral upper extremity supported Standing balance-[name]: Fair                             ADL either performed or assessed with clinical judgement   ADL Overall ADL's : Needs assistance/impaired     Grooming: Wash/dry hands;Wash/dry face;Oral care;Standing;Supervision/safety Grooming Details (indicate cue type and reason): Pt completed tasks at sink with BUE support on counter, SBA-CGA for safety/balance.                                    Extremity/Trunk Assessment  Vision       Perception     Praxis      Cognition Arousal/Alertness: Awake/alert Behavior During Therapy: WFL for tasks assessed/performed Overall Cognitive Status: Within Functional Limits for tasks assessed                                 General Comments:  intermittent difficulty with appropriate responses to questions, however when repeated was able to respond well, pt HOH        Exercises Other Exercises Other Exercises: Pt and caregiver instructed in ECS including PLB, activity pacing, and home/routines modifications    Shoulder Instructions       General Comments      Pertinent Vitals/ Pain       Pain Assessment Pain Assessment: No/denies pain  Home Living                                          Prior Functioning/Environment              Frequency  Min 2X/week        Progress Toward Goals  OT Goals(current goals can now be found in the care plan section)  Progress towards OT goals: Progressing toward goals  Acute Rehab OT Goals Patient Stated Goal: to go home OT Goal Formulation: With patient Time For Goal Achievement: 07/15/22 Potential to Achieve Goals: Good  Plan Discharge plan remains appropriate;Frequency remains appropriate    Co-evaluation                 AM-PAC OT "6 Clicks" Daily Activity     Outcome Measure   Help from another person eating meals?: None Help from another person taking care of personal grooming?: A Little Help from another person toileting, which includes using toliet, bedpan, or urinal?: A Little Help from another person bathing (including washing, rinsing, drying)?: A Lot Help from another person to put on and taking off regular upper body clothing?: A Little Help from another person to put on and taking off regular lower body clothing?: A Lot 6 Click Score: 17    End of Session Equipment Utilized During Treatment: Oxygen;Rolling walker (2 wheels)  OT Visit Diagnosis: Other abnormalities of gait and mobility (R26.89);Muscle weakness (generalized) (M62.81)   Activity Tolerance Patient tolerated treatment well   Patient Left in bed;with call bell/phone within reach;with bed alarm set;with family/visitor present   Nurse Communication Mobility  status (pt endorsing lightheadedness with positional changes)        Time: 1610-9604 OT Time Calculation (min): 44 min  Charges: OT General Charges $OT Visit: 1 Visit OT Treatments $Self Care/Home Management : 38-52 mins  Arman Filter., MPH, MS, OTR/L ascom [phone] 07/05/22, 1:22 PM

## 2022-07-05 NOTE — Plan of Care (Signed)

## 2022-07-05 NOTE — Telephone Encounter (Signed)
Reviewed the patient's chart. She is still currently admitted. Per Dr. Remus Blake notes from 07/04/22:  PT recommended acute rehab, management plan discussed with patient's daughter over the phone. We will continue to monitor and discharge when bed will be available.  Her monitor was ordered to be shipped on 07/02/22.  Will continue to monitor discharge status.

## 2022-07-05 NOTE — Progress Notes (Signed)
Physical Therapy Treatment Patient Details Name: Alexandria [name] MRN: [id number] DOB: 1935-12-14 Today's Date: 07/05/2022   History of Present Illness 87 y.o. female with a h/o chronic HFrEF, coronary calcium, lymphedema, HTN, HL, COPD on home O2, Dm2, CKD stage 3, mild AS, morbid obesity, IDA req IV rion and PRMCs, OSA, severe deconditioning, RUL lung cancer and depression, presented for AMS admitted for Afib RVR, UTI.    PT Comments    Patient alert, agreeable to PT, denied pain. Main limiting factor remains SOB with activity. She was able to perform bed mobility with CGA, good sitting balance noted during session. From elevated surface CGA to stand with PT stabilizing RW (pt uses rollator at home). She ambulated ~33ft twice, with a seated rest break on BSC between each bout. spO2 84% after ambulating but with seated rest returned to >90% within 90 seconds. Pt demonstrated decreased activity tolerance and endurance from baseline and would benefit from further skilled PT intervention to maximize safety and function.      Recommendations for follow up therapy are one component of a multi-disciplinary discharge planning process, led by the attending physician.  Recommendations may be updated based on patient status, additional functional criteria and insurance authorization.  Follow Up Recommendations       Assistance Recommended at Discharge Frequent or constant Supervision/Assistance  Patient can return home with the following A little help with walking and/or transfers;Assistance with cooking/housework;Direct supervision/assist for medications management;Assist for transportation;A little help with bathing/dressing/bathroom;Help with stairs or ramp for entrance   Equipment Recommendations  None recommended by PT    Recommendations for Other Services       Precautions / Restrictions Precautions Precautions: Fall Restrictions Weight Bearing Restrictions: No     Mobility  Bed  Mobility Overal bed mobility: Needs Assistance Bed Mobility: Supine to Sit     Supine to sit: Min guard          Transfers Overall transfer level: Needs assistance Equipment used: Rolling walker (2 wheels) Transfers: Sit to/from Stand Sit to Stand: Min guard, Min assist           General transfer comment: minA from [facility] for safety, not necessarily. pt able to stand CGA from EOB from elevated surface (pt has adjustable bed at home)    Ambulation/Gait Ambulation/Gait assistance: Min guard Gait Distance (Feet):  (40ft, seated rest break, 27ft more feet) Assistive device: Rolling walker (2 wheels) Gait Pattern/deviations: Step-through pattern, Decreased step length - right, Decreased step length - left Gait velocity: decreased     General Gait Details: DOE with minimal activity   Stairs             Wheelchair Mobility    Modified Rankin (Stroke Patients Only)       Balance Overall balance assessment: Needs assistance Sitting-balance support: Feet supported Sitting balance-[name]: Good     Standing balance support: Reliant on assistive device for balance, During functional activity, Bilateral upper extremity supported Standing balance-[name]: Fair                              Cognition                                                Exercises Other Exercises Other Exercises: BSC and pericare with supervision  General Comments        Pertinent Vitals/Pain      Home Living                          Prior Function            PT Goals (current goals can now be found in the care plan section) Progress towards PT goals: Progressing toward goals    Frequency    Min 4X/week      PT Plan Current plan remains appropriate    Co-evaluation              AM-PAC PT "6 Clicks" Mobility   Outcome Measure  Help needed turning from your back to your side while in a flat bed without  using bedrails?: A Little Help needed moving from lying on your back to sitting on the side of a flat bed without using bedrails?: A Little Help needed moving to and from a bed to a chair (including a wheelchair)?: A Little Help needed standing up from a chair using your arms (e.g., wheelchair or bedside chair)?: A Little Help needed to walk in hospital room?: A Little Help needed climbing 3-5 steps with a railing? : A Lot 6 Click Score: 17    End of Session Equipment Utilized During Treatment: Gait belt;Oxygen Activity Tolerance: Patient tolerated treatment well Patient left: with call bell/phone within reach;with family/visitor present;in bed;with bed alarm set Nurse Communication: Mobility status PT Visit Diagnosis: Other abnormalities of gait and mobility (R26.89);Muscle weakness (generalized) (M62.81)     Time: 1610-9604 PT Time Calculation (min) (ACUTE ONLY): 27 min  Charges:  $Therapeutic Activity: 23-37 mins                     Olga Coaster PT, DPT 12:59 PM,07/05/22

## 2022-07-05 NOTE — Care Management Important Message (Signed)
Important Message  Patient Details  Name: Alexandria [name] MRN: [id number] Date of Birth: 1935-08-08   Medicare Important Message Given:  Yes     Johnell Comings 07/05/2022, 11:08 AM

## 2022-07-06 DIAGNOSIS — J9621 Acute and chronic respiratory failure with hypoxia: Secondary | ICD-10-CM | POA: Diagnosis not present

## 2022-07-06 DIAGNOSIS — J9622 Acute and chronic respiratory failure with hypercapnia: Secondary | ICD-10-CM | POA: Diagnosis not present

## 2022-07-06 LAB — GLUCOSE, CAPILLARY
Glucose-Capillary: 89 mg/dL (ref 70–99)
Glucose-Capillary: 153 mg/dL — ABNORMAL HIGH (ref 70–99)
Glucose-Capillary: 192 mg/dL — ABNORMAL HIGH (ref 70–99)
Glucose-Capillary: 354 mg/dL — ABNORMAL HIGH (ref 70–99)

## 2022-07-06 LAB — BASIC METABOLIC PANEL
Anion gap: 12 (ref 5–15)
BUN: 41 mg/dL — ABNORMAL HIGH (ref 8–23)
CO2: 34 mmol/L — ABNORMAL HIGH (ref 22–32)
Calcium: 8.7 mg/dL — ABNORMAL LOW (ref 8.9–10.3)
Chloride: 97 mmol/L — ABNORMAL LOW (ref 98–111)
Creatinine, Ser: 1.22 mg/dL — ABNORMAL HIGH (ref 0.44–1.00)
GFR, Estimated: 44 mL/min — ABNORMAL LOW (ref >60)
Glucose, Bld: 182 mg/dL — ABNORMAL HIGH (ref 70–99)
Potassium: 3.9 mmol/L (ref 3.5–5.1)
Sodium: 143 mmol/L (ref 135–145)

## 2022-07-06 LAB — CBC
HCT: 32.5 % — ABNORMAL LOW (ref 36.0–46.0)
Hemoglobin: 9.3 g/dL — ABNORMAL LOW (ref 12.0–15.0)
MCH: 27.3 pg (ref 26.0–34.0)
MCHC: 28.6 g/dL — ABNORMAL LOW (ref 30.0–36.0)
MCV: 95.3 fL (ref 80.0–100.0)
Platelets: 343 10*3/uL (ref 150–400)
RBC: 3.41 MIL/uL — ABNORMAL LOW (ref 3.87–5.11)
RDW: 22.5 % — ABNORMAL HIGH (ref 11.5–15.5)
WBC: 11.6 10*3/uL — ABNORMAL HIGH (ref 4.0–10.5)
nRBC: 0 % (ref 0.0–0.2)

## 2022-07-06 LAB — PHOSPHORUS: Phosphorus: 3.4 mg/dL (ref 2.5–4.6)

## 2022-07-06 LAB — MAGNESIUM: Magnesium: 1.9 mg/dL (ref 1.7–2.4)

## 2022-07-06 LAB — IRON AND TIBC
Iron: 42 ug/dL (ref 28–170)
Saturation Ratios: 13 % (ref 10.4–31.8)
TIBC: 329 ug/dL (ref 250–450)
UIBC: 287 ug/dL

## 2022-07-06 LAB — CULTURE, BLOOD (ROUTINE X 2): Culture: NO GROWTH

## 2022-07-06 MED ORDER — CEFADROXIL 500 MG PO CAPS
1000.0000 mg | ORAL_CAPSULE | Freq: Two times a day (BID) | ORAL | Status: DC
  Administered 2022-07-06 – 2022-07-07 (×3): 1000 mg via ORAL
  Filled 2022-07-06 (×3): qty 2

## 2022-07-06 NOTE — TOC Progression Note (Signed)
Transition of Care [facility]) - Progression Note    Patient Details  Name: Alexandria [name] MRN: [id number] Date of Birth: [birth year]  [facility] CM/SW Contact  Marlowe Sax, RN Phone Number: 07/06/2022, 4:04 PM  Clinical Narrative:    Met with the patient and her daughter [name], I explained that the patient is walking a household distance I explained that we were able to secure a bed at [facility] I explained that Medicare will review once the patient is there and determine if they are going to pay for it or not,  If they determine that they will not cover it then they will have to pay out of pocket.  I explained if Primary insurance doesn't cover than neither does Secondary, she stated understanding and stated she is not ok with the patient going home saying the patient is too weak to go home with home health.  I explained that she does have a rehab bed but I can not guarentee that medicare will cover it, we will not know until after she discharges   Expected Discharge Plan: Home w Home Health Services Barriers to Discharge: Continued Medical Work up  Expected Discharge Plan and Services   Discharge Planning Services: CM Consult Post Acute Care Choice: Home Health Living arrangements for the past 2 months: Single Family Home                 DME Arranged: N/A DME Agency: NA       HH Arranged: PT, OT HH Agency: [facility] Date HH Agency Contacted: 07/01/22 Time HH Agency Contacted: 1425 Representative spoke with at [facility] Agency: [place]   Social Determinants of Health (SDOH) Interventions SDOH Screenings   Food Insecurity: No Food Insecurity (06/30/2022)  Housing: Low Risk  (06/30/2022)  Transportation Needs: No Transportation Needs (06/30/2022)  Utilities: Not At Risk (06/30/2022)  Depression (PHQ2-9): Low Risk  (03/12/2022)  Tobacco Use: Medium Risk (06/30/2022)    Readmission Risk Interventions     No data to display

## 2022-07-06 NOTE — Plan of Care (Signed)
  Problem: Education: Goal: Ability to demonstrate management of disease process will improve Outcome: Progressing Goal: Ability to verbalize understanding of medication therapies will improve Outcome: Progressing Goal: Individualized Educational Video(s) Outcome: Progressing   Problem: Activity: Goal: Capacity to carry out activities will improve Outcome: Progressing   Problem: Cardiac: Goal: Ability to achieve and maintain adequate cardiopulmonary perfusion will improve Outcome: Progressing   Problem: Education: Goal: Knowledge of disease or condition will improve Outcome: Progressing Goal: Knowledge of the prescribed therapeutic regimen will improve Outcome: Progressing Goal: Individualized Educational Video(s) Outcome: Progressing   Problem: Activity: Goal: Ability to tolerate increased activity will improve Outcome: Progressing Goal: Will verbalize the importance of balancing activity with adequate rest periods Outcome: Progressing   Problem: Respiratory: Goal: Ability to maintain a clear airway will improve Outcome: Progressing Goal: Levels of oxygenation will improve Outcome: Progressing Goal: Ability to maintain adequate ventilation will improve Outcome: Progressing   Problem: Education: Goal: Ability to describe self-care measures that may prevent or decrease complications (Diabetes Survival Skills Education) will improve Outcome: Progressing Goal: Individualized Educational Video(s) Outcome: Progressing

## 2022-07-06 NOTE — Plan of Care (Signed)
  Problem: Education: Goal: Ability to demonstrate management of disease process will improve Outcome: Progressing   Problem: Activity: Goal: Capacity to carry out activities will improve Outcome: Progressing   Problem: Cardiac: Goal: Ability to achieve and maintain adequate cardiopulmonary perfusion will improve Outcome: Progressing   Problem: Education: Goal: Knowledge of disease or condition will improve Outcome: Progressing   Problem: Respiratory: Goal: Ability to maintain a clear airway will improve Outcome: Progressing   Problem: Education: Goal: Ability to describe self-care measures that may prevent or decrease complications (Diabetes Survival Skills Education) will improve Outcome: Progressing

## 2022-07-06 NOTE — Progress Notes (Signed)
[facility] Progress Note  Patient: Alexandria [name]    WUJ:[id number]  DOA: 06/29/2022     Date of Service: the patient was seen and examined on 07/06/2022  Chief Complaint  Patient presents with   Altered Mental Status   Brief hospital course: BETHEL SIROIS is a 87 y.o. female with medical history significant of COPD with chronic hypoxic respiratory failure on 2.5 L of supplemental oxygen, iron deficiency anemia on chronic transfusions, HFpEF, type 2 diabetes, hypertension, hyperlipidemia, hypothyroidism, OSA, mild aortic stenosis, who presents to the ED due to altered mental status. History obtained from patient's daughter at bedside, [name], due to patient's altered mental status.  Approximately 10 days ago, patient's lethargy and fatigue have become significantly worse.  [name] states that patient would regularly fall asleep throughout the day.  In addition, patient felt generally weak.  They believe that it may be due to her anemia and had a follow-up with her oncologist.  She had a iron transfusion on 4/17 and blood transfusion on 4/18.  Despite this, lethargy continued to worsen.  In addition, patient had a fever on 4/21 in 4/22.  They went to an oncology follow-up today and was sent to the ED.    ED workup: Hypoxic respiratory failure, O2 sats dropped to 73%, patient was placed on oxygen and BiPAP in the ED Leukocytosis, WBC count elevated CT head negative for any acute findings, CXR demonstrated cardiomegaly with vascular congestion.  [facility] hospitalist consulted for admission and further management as below.  Assessment and Plan:  # Metabolic encephalopathy, multifactorial could be due to hypoxic and hypercapnic respiratory failure, UTI and A-fib with RVR.  Encephalopathy resolved, patient is back to her baseline. Continue supportive care and management as below. 4/27 patient mentioned about seeing and talking to the people who do not exist.  It is possible that patient might be  having hallucination which is causing insomnia and encephalopathy.     # Acute hypoxic and hypercapnic respiratory failure most likely COPD exacerbation and OSA, hypoventilation syndrome due to obesity ABG reviewed, improved after BiPAP S/p BiPAP overnight, use BiPAP as needed S/p Solu-Medrol 125 mg x 1 dose given, s/p prednisone 40 mg p.o.d x 4 days 4/27 still mild wheezing noticed, started prednisone 30 mg for 3 days, 20 mg for 3 days and 10 mg for 3 days Resumed Brovana nebulizer twice daily, continue Pulmicort nebulizer twice daily,  DuoNeb every 6 hourly scheduled, changed to as needed after improvement Continue doxycycline 100 mg IV twice daily prophylaxis for atypical infection Patient was advised to follow with pulmonology as an outpatient, patient may need BiPAP for home use versus sleep study and CPAP.  # Transient paroxysmal A-fib with RVR, resolved, converted back to normal sinus rhythm S/p amnio bolus was given, IV infusion was discontinued. Started  amiodarone 200 mg p.o. BID S/p Lopressor 5 mg IV given,  Patient is not a good candidate for anticoagulation due to risk of fall, anemia requiring transfusions. 4/25 started bisoprolol 5 mg Continue to monitor on telemetry,  Cardiology consult appreciated, recommended Zio patch, which will be sent to her house. Continue amiodarone 200 mg twice daily for 2 weeks followed by 200 mg p.o. daily.  And continue bisoprolol 5 mg p.o. daily  # Chronic diastolic heart failure BNP 245, slightly elevated S/p Lasix 40 mg IV daily, Held torsemide 20 mg home dose for now As per cardiology resume torsemide 20 mg on discharge. 4/28 --4/30 s/p Torsemide 20 mg p.o. daily home  dose x 3 days Hold torsemide tomorrow a.m. due to slightly elevated creatinine Monitor intake output and renal functions  # UTI, and E. coli bacteremia UA positive, urine culture growing E. coli, sensitive to ceftriaxone, cefazolin and ciprofloxacin. Continue ceftriaxone 2  g IV daily Blood culture positive for E. coli, pansensitive 4/29 d/w ID recommended total 10 days of antibiotics, switched to cefadroxil 1 g BID on 4/30 for 3 days.   4/29 repeat blood cultures NGTD  # Anemia of chronic disease and iron deficiency Patient follows hematologist as an outpatient and getting iron infusions as an outpatient. Hb 7.6, transfuse 1 unit of PRBC due to high risk for symptomatic anemia and prevent rehospitalization. 4/30 Hb 9.3, stable iron profile, iron level 43 and transferrin saturation 15%, within normal range Recommend to continue oral iron supplement and follow with hematologist as an outpatient. 4/30 Iron profile, iron 42, transferrin saturation 13% On 06/16/22 Vitamin B12 level was 303, goal >400, vitamin B12 1000 mcg IM injection daily x 3 doses ordered followed by oral supplement. We will follow pharmacy to find out IV iron infusion and then probably with the give her 1 dose before discharge.  # NIDDM T2, held home medications for now Continue NovoLog sliding scale, monitor CBG,  # Depression, continue Prozac # Hypothyroid, continue Synthroid  # Possible Hallucinations 4/27 patient stated that she is seeing different people and hearing voices.  It is questionable that she may be communicating in her dreams.  Patient's daughter thinks that she is not hallucinating, and does not want psych consult and does not want any treatment. We will continue supportive care and continue to monitor. Started melatonin 3 mg low-dose, it may help her to have a good sleep. 4/28 patient did not sleep properly last night due to hallucinations.  Does not want psych consult and does not want any medications for that.  Discussed with patient's daughter as well who does not want to involve psych at this time. 4/29 still having visual and auditory hallucinations, does not want psych consult and does not want any treatment. 4/30 patient is still having hallucinations as per caregiver  but she denied to me.  Body mass index is 38.37 kg/m.  Interventions:       Diet: Heart healthy diet/carb modified diet/fluid restriction 1.5 to 2/day DVT Prophylaxis: Subcutaneous Lovenox   Advance goals of care discussion: Full code  Family Communication: family was not present at bedside, at the time of interview.  The pt provided permission to discuss medical plan with the family. Opportunity was given to ask question and all questions were answered satisfactorily.  4/29 discussed with patient's daughter over the phone  Disposition:  Pt is from Home, admitted with Resp failure, A.fib and UTI, still has dyspnea on exertion and deconditioning, ambulatory dysfunction, PT and OT eval done, recommended HH, but patient and patient's daughter would like to go SNF, which precludes a safe discharge. Patient is having hallucinations visual and auditory, does not want psych consult and does not want treatment.  Patient's daughter is also refusing psych consult and psych treatment. Discharge to SNF vs Home with HH TBD, TOC needs to follow-up with patient's and her daughter for discharge planning.     Subjective: No significant events overnight, patient was sitting comfortably on the recliner, patient stated that she did walk with physical therapy without any complaints.  Patient stated that she is doing well.  Denied any hallucinations.  Discussed with caregiver who mentioned that patient is still  having hallucinations but she is not mentioning it. TOC needs to discuss with patient's daughter regarding discharge planning.   Physical Exam: General: NAD, sitting comfortably in the recliner  Appear in no distress, affect appropriate Eyes: PERRLA ENT: Oral Mucosa Clear, dry Neck: no JVD,  Cardiovascular: S1 and S2 Present, no Murmur,  Respiratory: Equal air entry bilaterally, mild crackles, No wheezing. Abdomen: Bowel Sound present, Soft and no tenderness,  Skin: no rashes Extremities: no  Pedal edema, no calf tenderness Neurologic: without any new focal findings Gait not checked due to patient safety concerns  Vitals:   07/05/22 2006 07/05/22 2303 07/06/22 0733 07/06/22 0902  BP:  (!) 125/54  (!) 139/55  Pulse:  (!) 59  60  Resp:  20  18  Temp:  97.9 F (36.6 C)  98.7 F (37.1 C)  TempSrc:      SpO2: 96% 98%  94%  Weight:   103.3 kg   Height:        Intake/Output Summary (Last 24 hours) at 07/06/2022 1540 Last data filed at 07/06/2022 1052 Gross per 24 hour  Intake 480 ml  Output 450 ml  Net 30 ml   Filed Weights   07/03/22 0714 07/05/22 0707 07/06/22 0733  Weight: 73.5 kg 98.8 kg 103.3 kg    Data Reviewed: I have personally reviewed and interpreted daily labs, tele strips, imagings as discussed above. I reviewed all nursing notes, pharmacy notes, vitals, pertinent old records I have discussed plan of care as described above with RN and patient/family.  CBC: Recent Labs  Lab 06/30/22 0631 07/01/22 0611 07/02/22 0603 07/03/22 0629 07/04/22 0553 07/05/22 0632 07/06/22 1048  WBC 8.3   < > 5.8 6.7 7.7 6.6 11.6*  NEUTROABS 8.1*  --   --   --   --   --   --   HGB 8.3*   < > 7.6* 8.7* 8.4* 9.0* 9.3*  HCT 29.8*   < > 26.0* 29.6* 29.6* 30.3* 32.5*  MCV 103.8*   < > 98.5 92.8 94.3 93.2 95.3  PLT 131*   < > 202 239 278 278 343   < > = values in this interval not displayed.   Basic Metabolic Panel: Recent Labs  Lab 06/30/22 0631 07/01/22 8295 07/02/22 0603 07/03/22 0629 07/04/22 0553 07/05/22 0632 07/06/22 1048  NA 141 140 144 141 142 143 143  K 4.7 4.2 4.0 4.1 4.3 3.9 3.9  CL 99 99 101 102 105 101 97*  CO2 34* 33* 35* 32 32 34* 34*  GLUCOSE 195* 129* 120* 105* 98 107* 182*  BUN 35* 47* 48* 40* 40* 41* 41*  CREATININE 1.33* 1.32* 1.21* 1.11* 1.10* 1.18* 1.22*  CALCIUM 8.2* 8.5* 8.4* 8.6* 8.9 8.7* 8.7*  MG 2.2 2.2 2.1 2.2  --   --  1.9  PHOS  --  2.8 2.7 3.3  --   --  3.4    Studies: DG Chest Port 1 View  Result Date: 07/05/2022 CLINICAL  DATA:  Dyspnea on exertion EXAM: PORTABLE CHEST 1 VIEW COMPARISON:  06/29/2022 FINDINGS: Enlarged cardiopericardial silhouette with vascular congestion. Tiny pleural effusions. No pneumothorax. Calcified aorta. Overlapping cardiac leads. Film is under penetrated IMPRESSION: Enlarged cardiopericardial silhouette with vascular congestion. Tiny effusions. Calcified aorta Electronically Signed   By: Karen Kays M.D.   On: 07/05/2022 17:58    Scheduled Meds:  amiodarone  200 mg Oral BID   arformoterol  15 mcg Nebulization BID   bisoprolol  5 mg Oral  Daily   budesonide  0.5 mg Nebulization BID   cefadroxil  1,000 mg Oral BID   vitamin B-12  1,000 mcg Oral Daily   enoxaparin (LOVENOX) injection  40 mg Subcutaneous Q24H   ezetimibe  10 mg Oral QHS   FLUoxetine  20 mg Oral BID   insulin aspart  0-9 Units Subcutaneous TID WC   ipratropium-albuterol  3 mL Nebulization BID   levothyroxine  125 mcg Oral Q0600   melatonin  2.5 mg Oral QHS   [START ON 07/07/2022] predniSONE  20 mg Oral Q breakfast   Followed by   Melene Muller ON 07/10/2022] predniSONE  10 mg Oral Q breakfast   sodium chloride flush  3 mL Intravenous Q12H   torsemide  20 mg Oral Daily   Continuous Infusions:   PRN Meds: acetaminophen **OR** acetaminophen, metoprolol tartrate, ondansetron **OR** ondansetron (ZOFRAN) IV, polyethylene glycol  Time spent: 50 minutes  Author: Gillis Santa. MD [facility] 07/06/2022 3:40 PM  To reach On-call, see care teams to locate the attending and reach out to them via [email or web address]. If 7PM-7AM, please contact night-coverage If you still have difficulty reaching the attending provider, please page the [facility] (Director on Call) for [facility] on amion for assistance.

## 2022-07-06 NOTE — Progress Notes (Signed)
Occupational Therapy Treatment Patient Details Name: Alexandria [name] MRN: [id number] DOB: [birth year] Today's Date: 07/06/2022   History of present illness 87 y.o. female with a h/o chronic HFrEF, coronary calcium, lymphedema, HTN, HL, COPD on home O2, Dm2, CKD stage 3, mild AS, morbid obesity, IDA req IV rion and PRMCs, OSA, severe deconditioning, RUL lung cancer and depression, presented for AMS admitted for Afib RVR, UTI.   OT comments  Pt seen for OT tx. Pt received in bathroom with caregiver for toileting. Demo'd difficulty with STS from std height toilet but did so without direct assist. Caregiver later notes she has had to provide some assist during previous toilet transfers during this admission. Pt ambulated with RW from bathroom to recliner for seated rest break. MIN A for gown mgt. Daughter present for portion of session. Pt ambulated in hall >50' with RW, chair follow, and utilizing standing and seated rest breaks as needed to support breath recovery. Required intermittent VC for utilizing pursed lip breathing throughout session to support recovery and minimize SOB. HR 57-62, SpO2 95-98% on 3L with activity. Pt/family/caregiver educated in role of OT and pt progress to date. Daughter notes that pt's den where she spends a majority of the day is sunken with a ramp to enter/exit and that her bathroom is on the other end of the home. Home set up presents challenge to return home and return to normal daily routines. Pt will benefit from continued skilled OT services to maximize return to PLOF and minimize caregiver burden.    Recommendations for follow up therapy are one component of a multi-disciplinary discharge planning process, led by the attending physician.  Recommendations may be updated based on patient status, additional functional criteria and insurance authorization.    Assistance Recommended at Discharge Frequent or constant Supervision/Assistance  Patient can return home with the  following  A little help with walking and/or transfers;Help with stairs or ramp for entrance;A lot of help with bathing/dressing/bathroom;Assistance with cooking/housework;Assist for transportation;Direct supervision/assist for medications management   Equipment Recommendations  None recommended by OT    Recommendations for Other Services      Precautions / Restrictions Precautions Precautions: Fall Restrictions Weight Bearing Restrictions: No       Mobility Bed Mobility               General bed mobility comments: sitting in recliner at start/end of session    Transfers Overall transfer level: Needs assistance Equipment used: Rolling walker (2 wheels) Transfers: Sit to/from Stand Sit to Stand: Supervision           General transfer comment: Pt completed several STS transfers from std height toilet, EOB, and reclinder during session     Balance Overall balance assessment: Needs assistance Sitting-balance support: Feet supported Sitting balance-[name]: Good     Standing balance support: Reliant on assistive device for balance, During functional activity, Bilateral upper extremity supported Standing balance-[name]: Fair                             ADL either performed or assessed with clinical judgement   ADL Overall ADL's : Needs assistance/impaired                 Upper Body Dressing : Minimal assistance;Sitting Upper Body Dressing Details (indicate cue type and reason): Min A for gown mgt     Toilet Transfer: Min guard;Supervision/safety;Regular Toilet;Rolling walker (2 wheels) Toilet Transfer Details (indicate cue  type and reason): significant effort to stand from std height toilet with caregiver provided SBA and manging O2 line and IV pole upon OT's arrival. Caregiver notes she required assist earlier to stand. Toileting- Clothing Manipulation and Hygiene: Sitting/lateral lean;Supervision/safety       Functional mobility  during ADLs: Supervision/safety;Rolling walker (2 wheels)      Extremity/Trunk Assessment              Vision       Perception     Praxis      Cognition Arousal/Alertness: Awake/alert Behavior During Therapy: WFL for tasks assessed/performed Overall Cognitive Status: Within Functional Limits for tasks assessed                                 General Comments: questionable historian and caregiver notes that pt tends overestimate what she can do        Exercises Other Exercises Other Exercises: Pt further instructed in PLB during ADL and mobility requiring intermittent VC to utilize. Cues for hand placement during transfers to improve technique with pt noting improvement. Dtr educated in role of therapy.    Shoulder Instructions       General Comments HR 57-62, SpO2 95-98% on 3L with activity    Pertinent Vitals/ Pain       Pain Assessment Pain Assessment: No/denies pain  Home Living                                          Prior Functioning/Environment              Frequency  Min 2X/week        Progress Toward Goals  OT Goals(current goals can now be found in the care plan section)  Progress towards OT goals: Progressing toward goals  Acute Rehab OT Goals Patient Stated Goal: to go home OT Goal Formulation: With patient Time For Goal Achievement: 07/15/22 Potential to Achieve Goals: Good  Plan Frequency remains appropriate    Co-evaluation    PT/OT/SLP Co-Evaluation/Treatment: Yes Reason for Co-Treatment: To address functional/ADL transfers PT goals addressed during session: Mobility/safety with mobility;Other (comment) Physicist, medical) OT goals addressed during session: ADL's and self-care;Other (comment) Physicist, medical)      AM-PAC OT "6 Clicks" Daily Activity     Outcome Measure   Help from another person eating meals?: None Help from another person taking  care of personal grooming?: A Little Help from another person toileting, which includes using toliet, bedpan, or urinal?: A Little Help from another person bathing (including washing, rinsing, drying)?: A Lot Help from another person to put on and taking off regular upper body clothing?: A Little Help from another person to put on and taking off regular lower body clothing?: A Lot 6 Click Score: 17    End of Session Equipment Utilized During Treatment: Oxygen;Rolling walker (2 wheels);Gait belt      Activity Tolerance Patient tolerated treatment well   Patient Left in chair;with call bell/phone within reach;with chair alarm set;with family/visitor present   Nurse Communication Mobility status        Time: 1346-1440 OT Time Calculation (min): 54 min  Charges: OT General Charges $OT Visit: 1 Visit OT Treatments $Self Care/Home Management : 23-37 mins  Arman Filter., MPH, MS, OTR/L ascom [phone] 07/06/22, 3:24 PM

## 2022-07-06 NOTE — Progress Notes (Signed)
Physical Therapy Treatment Patient Details Name: BRINDLEY MADARANG MRN: [id number] DOB: June 25, 1935 Today's Date: 07/06/2022   History of Present Illness 87 y.o. female with a h/o chronic HFrEF, coronary calcium, lymphedema, HTN, HL, COPD on home O2, Dm2, CKD stage 3, mild AS, morbid obesity, IDA req IV rion and PRMCs, OSA, severe deconditioning, RUL lung cancer and depression, presented for AMS admitted for Afib RVR, UTI.    PT Comments    Patient sitting with OT upon PT entrance, family/caregiver at bedside. The patient was able to perform transfers with supervision and RW, needed cues for hand placement initially. She ambulated several bouts this session; 44ft, 42ft, and then 81ft with CGA and close chair follow with seated rest breaks as needed . Pt educated on PLB, activity pacing, and some discussion of home set up with family. Pt did display DOE with all activity and noted for decreased activity tolerance/endurance. She would benefit from further skilled PT intervention to maximize function, safety, and independence.      Recommendations for follow up therapy are one component of a multi-disciplinary discharge planning process, led by the attending physician.  Recommendations may be updated based on patient status, additional functional criteria and insurance authorization.  Follow Up Recommendations       Assistance Recommended at Discharge Frequent or constant Supervision/Assistance  Patient can return home with the following A little help with walking and/or transfers;Assistance with cooking/housework;Direct supervision/assist for medications management;Assist for transportation;A little help with bathing/dressing/bathroom;Help with stairs or ramp for entrance   Equipment Recommendations  None recommended by PT    Recommendations for Other Services       Precautions / Restrictions Precautions Precautions: Fall Restrictions Weight Bearing Restrictions: No     Mobility  Bed  Mobility               General bed mobility comments: sitting in recliner at start/end of session    Transfers Overall transfer level: Needs assistance Equipment used: Rolling walker (2 wheels) Transfers: Sit to/from Stand Sit to Stand: Supervision           General transfer comment: Pt completed several STS transfers from std height toilet, EOB, and reclinder during session    Ambulation/Gait Ambulation/Gait assistance: Min guard Gait Distance (Feet):  (35ft, 1ft, 42ft) Assistive device: Rolling walker (2 wheels) Gait Pattern/deviations: Step-through pattern, Decreased step length - right, Decreased step length - left       General Gait Details: seated rest breaks for DOE as needed   Optometrist    Modified Rankin (Stroke Patients Only)       Balance Overall balance assessment: Needs assistance Sitting-balance support: Feet supported Sitting balance-[name]: Good     Standing balance support: Reliant on assistive device for balance, During functional activity, Bilateral upper extremity supported Standing balance-[name]: Fair                              Cognition Arousal/Alertness: Awake/alert Behavior During Therapy: WFL for tasks assessed/performed Overall Cognitive Status: Within Functional Limits for tasks assessed                                 General Comments: questionable historian and caregiver notes that pt tends overestimate what she can do        Exercises  General Comments General comments (skin integrity, edema, etc.): HR 57-62, SpO2 95-98% on 3L with activity      Pertinent Vitals/Pain Pain Assessment Pain Assessment: No/denies pain    Home Living                          Prior Function            PT Goals (current goals can now be found in the care plan section) Progress towards PT goals: Progressing toward goals    Frequency     Min 4X/week      PT Plan Current plan remains appropriate    Co-evaluation   Reason for Co-Treatment: To address functional/ADL transfers PT goals addressed during session: Mobility/safety with mobility;Other (comment) Physicist, medical) OT goals addressed during session: ADL's and self-care;Other (comment) Careers adviser)      AM-PAC PT "6 Clicks" Mobility   Outcome Measure  Help needed turning from your back to your side while in a flat bed without using bedrails?: A Little Help needed moving from lying on your back to sitting on the side of a flat bed without using bedrails?: A Little Help needed moving to and from a bed to a chair (including a wheelchair)?: A Little Help needed standing up from a chair using your arms (e.g., wheelchair or bedside chair)?: A Little Help needed to walk in hospital room?: A Little Help needed climbing 3-5 steps with a railing? : A Lot 6 Click Score: 17    End of Session Equipment Utilized During Treatment: Oxygen;Gait belt Activity Tolerance: Patient tolerated treatment well Patient left: with call bell/phone within reach;with family/visitor present;in chair;Other (comment) (with OT bedside) Nurse Communication: Mobility status PT Visit Diagnosis: Other abnormalities of gait and mobility (R26.89);Muscle weakness (generalized) (M62.81)     Time: 1610-9604 PT Time Calculation (min) (ACUTE ONLY): 32 min  Charges:  $Therapeutic Exercise: 8-22 mins $Therapeutic Activity: 23-37 mins                     Olga Coaster PT, DPT 3:32 PM,07/06/22

## 2022-07-06 NOTE — Progress Notes (Addendum)
Physical Therapy Treatment Patient Details Name: Alexandria [name] MRN: [id number] DOB: Dec 20, 1935 Today's Date: 07/06/2022   History of Present Illness 87 y.o. female with a h/o chronic HFrEF, coronary calcium, lymphedema, HTN, HL, COPD on home O2, Dm2, CKD stage 3, mild AS, morbid obesity, IDA req IV rion and PRMCs, OSA, severe deconditioning, RUL lung cancer and depression, presented for AMS admitted for Afib RVR, UTI.    PT Comments    Patient alert, agreeable to PT, up in chair at start/end of session. The patient was able to perform sit <> stand with RW and supervision, cued for hand placement to maximize independence. She ambulated ~15ft twice in the room, no LOB, CGA for safety. Pt did endorse fatigue with activity but no physical assistance needed. SpO2 >90% throughout session. Pt exhibited decreased activity tolerance and endurance from baseline. Pt would benefit from further skilled PT services to maximize function and safety.     Recommendations for follow up therapy are one component of a multi-disciplinary discharge planning process, led by the attending physician.  Recommendations may be updated based on patient status, additional functional criteria and insurance authorization.  Follow Up Recommendations       Assistance Recommended at Discharge Frequent or constant Supervision/Assistance  Patient can return home with the following A little help with walking and/or transfers;Assistance with cooking/housework;Direct supervision/assist for medications management;Assist for transportation;A little help with bathing/dressing/bathroom;Help with stairs or ramp for entrance   Equipment Recommendations  None recommended by PT    Recommendations for Other Services       Precautions / Restrictions Precautions Precautions: Fall Restrictions Weight Bearing Restrictions: No     Mobility  Bed Mobility               General bed mobility comments: sitting in recliner at  start/end of session    Transfers Overall transfer level: Needs assistance Equipment used: Rolling walker (2 wheels) Transfers: Sit to/from Stand Sit to Stand: Supervision           General transfer comment: cued for hand placement on recliner prior to standing    Ambulation/Gait Ambulation/Gait assistance: Min guard Gait Distance (Feet):  (46ft, twice) Assistive device: Rolling walker (2 wheels) Gait Pattern/deviations: Step-through pattern, Decreased step length - right, Decreased step length - left           Stairs             Wheelchair Mobility    Modified Rankin (Stroke Patients Only)       Balance Overall balance assessment: Needs assistance Sitting-balance support: Feet supported Sitting balance-[name]: Good     Standing balance support: Reliant on assistive device for balance, During functional activity, Bilateral upper extremity supported Standing balance-[name]: Fair                              Cognition Arousal/Alertness: Awake/alert Behavior During Therapy: WFL for tasks assessed/performed Overall Cognitive Status: Within Functional Limits for tasks assessed                                          Exercises      General Comments        Pertinent Vitals/Pain Pain Assessment Pain Assessment: No/denies pain    Home Living  Prior Function            PT Goals (current goals can now be found in the care plan section) Progress towards PT goals: Progressing toward goals    Frequency    Min 4X/week      PT Plan Current plan remains appropriate    Co-evaluation              AM-PAC PT "6 Clicks" Mobility   Outcome Measure  Help needed turning from your back to your side while in a flat bed without using bedrails?: A Little Help needed moving from lying on your back to sitting on the side of a flat bed without using bedrails?: A Little Help  needed moving to and from a bed to a chair (including a wheelchair)?: A Little Help needed standing up from a chair using your arms (e.g., wheelchair or bedside chair)?: A Little Help needed to walk in hospital room?: A Little Help needed climbing 3-5 steps with a railing? : A Lot 6 Click Score: 17    End of Session Equipment Utilized During Treatment: Oxygen Activity Tolerance: Patient tolerated treatment well Patient left: with call bell/phone within reach;with family/visitor present;in chair;with chair alarm set Nurse Communication: Mobility status PT Visit Diagnosis: Other abnormalities of gait and mobility (R26.89);Muscle weakness (generalized) (M62.81)     Time: 1610-9604 PT Time Calculation (min) (ACUTE ONLY): 27 min  Charges:  $Therapeutic Activity: 23-37 mins                     Olga Coaster PT, DPT 1:17 PM,07/06/22

## 2022-07-06 NOTE — NC FL2 (Signed)
[place] MEDICAID FL2 LEVEL OF CARE FORM     IDENTIFICATION  Patient Name: Alexandria [name] Birthdate: Aug 28, 1935 Sex: female Admission Date (Current Location): 06/29/2022  [facility] and [place] Number:  Chiropodist and Address:  [facility], [address], [place], [place] [address]      Provider Number: [id number]  Attending Physician Name and Address:  Gillis Santa, MD  Relative Name and Phone Number:  [name] Daughter [phone]    Current Level of Care: Hospital Recommended Level of Care: Skilled Nursing Facility Prior Approval Number:    Date Approved/Denied:   PASRR Number: [id number]  Discharge Plan: SNF    Current Diagnoses: Patient Active Problem List   Diagnosis Date Noted   Paroxysmal atrial fibrillation (HCC) 07/02/2022   Acute on chronic respiratory failure with hypoxia and hypercapnia (HCC) 06/29/2022   Acute encephalopathy 06/29/2022   Aortic stenosis, mild 03/30/2022   PVC's (premature ventricular contractions) 02/23/2022   Skin lesion 08/12/2021   Leg swelling 06/08/2021   Grief 05/27/2021   H/O laceration of skin 05/27/2021   PND (post-nasal drip) 04/27/2021   Wheezing 04/27/2021   Acute cough 04/27/2021   Physical deconditioning 01/30/2020   Healthcare maintenance 01/30/2020   PAD (peripheral artery disease) (HCC) 01/27/2020   Sore throat 11/21/2019   GERD (gastroesophageal reflux disease) 11/21/2019   CAD (coronary artery disease) 07/24/2019   Malignant neoplasm of right upper lobe of lung (HCC) 07/24/2019   Vitamin D deficiency 06/29/2018   Hyperkalemia 06/28/2018   Dysuria 01/01/2018   Abnormal findings on diagnostic imaging of lung 12/18/2017   Rhinitis 12/18/2017   Cellulitis 10/20/2016   Knee laceration, right, subsequent encounter 10/06/2016   Knee pain, acute 10/06/2016   Chronic pain of left knee 09/15/2016   Right foot pain 09/15/2016   Chronic respiratory failure with hypoxia (HCC)  07/14/2016   Diverticulosis of colon without hemorrhage    Hemorrhoids    Hiatal hernia    Type 2 diabetes mellitus with diabetic neuropathy, without long-term current use of insulin (HCC) 04/04/2015   Left temporal headache 03/14/2015   Vertigo 03/14/2015   Chest tightness 12/04/2014   Cystitis 11/03/2014   Acute on chronic heart failure with preserved ejection fraction (HFpEF) (HCC)    Iron deficiency anemia    Obesity hypoventilation syndrome (HCC)    Chronic diastolic CHF (congestive heart failure) (HCC)    Acute cystitis    Symptomatic anemia    Medicare annual wellness visit, subsequent 06/14/2014   Advance care planning 06/14/2014   RLQ abdominal pain 05/10/2014   COPD exacerbation (HCC) 04/28/2014   Lower extremity edema 09/15/2012   DOE (dyspnea on exertion) 08/24/2012   Shoulder pain 12/17/2011   Abdominal hernia 12/17/2011   Anxiety 09/23/2011   Osteopenia 07/25/2011   Obstructive sleep apnea 02/09/2010   Murmur 02/04/2010   CHEST PAIN 01/13/2010   PALPITATIONS 08/29/2008   ESOPHAGEAL STENOSIS 11/22/2007   DYSPHAGIA 11/08/2007   ESOPHAGITIS 10/11/2007   Edema 07/03/2007   COPD with chronic bronchitis and emphysema (HCC) 05/23/2007   ALLERGY 07/25/2006   NUMBNESS 10/06/2001   Hyperlipemia 10/04/2000   Depression, recurrent (HCC) 03/08/1997   Essential hypertension 03/08/1992   Obesity, Class III, BMI 40-49.9 (morbid obesity) (HCC) 03/08/1989   ROTATOR CUFF SYNDROME, RIGHT 03/08/1982   Hypothyroidism 03/08/1968    Orientation RESPIRATION BLADDER Height & Weight     Self, Time, Situation, Place  Normal, O2 (3 liters) External catheter, Incontinent Weight: 103.3 kg Height:  5\' 5"  (165.1  cm)  BEHAVIORAL SYMPTOMS/MOOD NEUROLOGICAL BOWEL NUTRITION STATUS      Continent Diet  AMBULATORY STATUS COMMUNICATION OF NEEDS Skin   Extensive Assist Verbally Normal                       Personal Care Assistance Level of Assistance  Bathing, Dressing, Feeding  Bathing Assistance: Limited assistance Feeding assistance: Limited assistance Dressing Assistance: Maximum assistance     Functional Limitations Info  Sight, Speech, Hearing Sight Info: Adequate (Wears glasses) Hearing Info: Adequate Speech Info: Adequate    SPECIAL CARE FACTORS FREQUENCY  PT (By licensed PT), OT (By licensed OT)     PT Frequency: 5 times per week OT Frequency: 5 times per week            Contractures Contractures Info: Not present    Additional Factors Info  Insulin Sliding Scale       Insulin Sliding Scale Info: CBG 70 - 120: 0 units  CBG 121 - 150: 1 unit  CBG 151 - 200: 2 units  CBG 201 - 250: 3 units  CBG 251 - 300: 5 units  CBG 301 - 350: 7 units  CBG 351 - 400 9 units  CBG > 400 call MD and obtain STAT lab verification       Current Medications (07/06/2022):  This is the current hospital active medication list Current Facility-Administered Medications  Medication Dose Route Frequency Provider Last Rate Last Admin   acetaminophen (TYLENOL) tablet 650 mg  650 mg Oral Q6H PRN Verdene Lennert, MD   650 mg at 07/03/22 9147   Or   acetaminophen (TYLENOL) suppository 650 mg  650 mg Rectal Q6H PRN Verdene Lennert, MD       amiodarone (PACERONE) tablet 200 mg  200 mg Oral BID Antonieta Iba, MD   200 mg at 07/05/22 2227   arformoterol (BROVANA) nebulizer solution 15 mcg  15 mcg Nebulization BID Gillis Santa, MD   15 mcg at 07/06/22 0738   bisoprolol (ZEBETA) tablet 5 mg  5 mg Oral Daily Antonieta Iba, MD   5 mg at 07/05/22 0949   budesonide (PULMICORT) nebulizer solution 0.5 mg  0.5 mg Nebulization BID Verdene Lennert, MD   0.5 mg at 07/06/22 0736   cefTRIAXone (ROCEPHIN) 2 g in sodium chloride 0.9 % 100 mL IVPB  2 g Intravenous Q24H Manuela Schwartz, NP 200 mL/hr at 07/05/22 1433 2 g at 07/05/22 1433   cyanocobalamin (VITAMIN B12) tablet 1,000 mcg  1,000 mcg Oral Daily Gillis Santa, MD   1,000 mcg at 07/05/22 8295   doxycycline (VIBRAMYCIN) 100 mg  in sodium chloride 0.9 % 250 mL IVPB  100 mg Intravenous Q12H Verdene Lennert, MD 125 mL/hr at 07/05/22 2226 100 mg at 07/05/22 2226   enoxaparin (LOVENOX) injection 40 mg  40 mg Subcutaneous Q24H Bari Mantis A, RPH   40 mg at 07/05/22 2227   ezetimibe (ZETIA) tablet 10 mg  10 mg Oral QHS Verdene Lennert, MD   10 mg at 07/05/22 2227   FLUoxetine (PROZAC) capsule 20 mg  20 mg Oral BID Verdene Lennert, MD   20 mg at 07/05/22 2227   insulin aspart (novoLOG) injection 0-9 Units  0-9 Units Subcutaneous TID WC Verdene Lennert, MD   5 Units at 07/05/22 1717   ipratropium-albuterol (DUONEB) 0.5-2.5 (3) MG/3ML nebulizer solution 3 mL  3 mL Nebulization BID Gillis Santa, MD   3 mL at 07/06/22 0736   levothyroxine (SYNTHROID)  tablet 125 mcg  125 mcg Oral Q0600 Verdene Lennert, MD   125 mcg at 07/06/22 1610   melatonin tablet 2.5 mg  2.5 mg Oral QHS Gillis Santa, MD   2.5 mg at 07/05/22 2227   metoprolol tartrate (LOPRESSOR) injection 5 mg  5 mg Intravenous Once PRN Foust, Katy L, NP       ondansetron (ZOFRAN) tablet 4 mg  4 mg Oral Q6H PRN Verdene Lennert, MD       Or   ondansetron (ZOFRAN) injection 4 mg  4 mg Intravenous Q6H PRN Verdene Lennert, MD       polyethylene glycol (MIRALAX / GLYCOLAX) packet 17 g  17 g Oral Daily PRN Verdene Lennert, MD       Melene Muller ON 07/07/2022] predniSONE (DELTASONE) tablet 20 mg  20 mg Oral Q breakfast Gillis Santa, MD       Followed by   Melene Muller ON 07/10/2022] predniSONE (DELTASONE) tablet 10 mg  10 mg Oral Q breakfast Gillis Santa, MD       sodium chloride flush (NS) 0.9 % injection 3 mL  3 mL Intravenous Q12H Verdene Lennert, MD   3 mL at 07/05/22 2232   torsemide (DEMADEX) tablet 20 mg  20 mg Oral Daily Gillis Santa, MD   20 mg at 07/05/22 9604     Discharge Medications: Please see discharge summary for a list of discharge medications.  Relevant Imaging Results:  Relevant Lab Results:   Additional Information SS# [id number]  Marlowe Sax,  RN

## 2022-07-06 NOTE — TOC Progression Note (Signed)
Transition of Care [facility]) - Progression Note    Patient Details  Name: Alexandria [name] MRN: [id number] Date of Birth: Mar 28, 1935  Transition of [facility]) CM/SW Contact  Marlowe Sax, RN Phone Number: 07/06/2022, 10:28 AM  Clinical Narrative:    Met with the patient and her caregiver in the room, called her daughter Alexandria [name] while in the room, we talked about the patient going to STR She has a paid caregiver 24/7 They want her to go to rehab prior to returning home to get strong enough to be able to go to the bathroom and back with little assistance I explained the Recommendation has to be STR for Insurance to cover it, the patient has been to [facility] in the past and doe snot want to go there.  Her daughter stated that they will only accept a 4 or 5 star facility, I explained if they go to rehab then we would of course try to get a bed in a 4 or 5 star facility, however Medicare Guideline states that the patient will need to accept a bed offer that is available if they do not get the one that they want I obtained a PASSR and completed the FL2, sent out the Bedsearch Will review bed offers once obtained   Expected Discharge Plan: Home w Home Health Services Barriers to Discharge: Continued Medical Work up  Expected Discharge Plan and Services   Discharge Planning Services: CM Consult Post Acute Care Choice: Home Health Living arrangements for the past 2 months: Single Family Home                 DME Arranged: N/A DME Agency: NA       HH Arranged: PT, OT HH Agency: [facility] Date HH Agency Contacted: 07/01/22 Time HH Agency Contacted: 1425 Representative spoke with at [facility] Agency: [place]   Social Determinants of Health (SDOH) Interventions SDOH Screenings   Food Insecurity: No Food Insecurity (06/30/2022)  Housing: Low Risk  (06/30/2022)  Transportation Needs: No Transportation Needs (06/30/2022)  Utilities: Not At Risk (06/30/2022)  Depression (PHQ2-9): Low Risk   (03/12/2022)  Tobacco Use: Medium Risk (06/30/2022)    Readmission Risk Interventions     No data to display

## 2022-07-07 DIAGNOSIS — I5032 Chronic diastolic (congestive) heart failure: Secondary | ICD-10-CM | POA: Diagnosis present

## 2022-07-07 DIAGNOSIS — I959 Hypotension, unspecified: Secondary | ICD-10-CM | POA: Diagnosis not present

## 2022-07-07 DIAGNOSIS — G9341 Metabolic encephalopathy: Secondary | ICD-10-CM | POA: Diagnosis not present

## 2022-07-07 DIAGNOSIS — I25119 Atherosclerotic heart disease of native coronary artery with unspecified angina pectoris: Secondary | ICD-10-CM | POA: Diagnosis not present

## 2022-07-07 DIAGNOSIS — E1140 Type 2 diabetes mellitus with diabetic neuropathy, unspecified: Secondary | ICD-10-CM | POA: Diagnosis not present

## 2022-07-07 DIAGNOSIS — J9622 Acute and chronic respiratory failure with hypercapnia: Secondary | ICD-10-CM | POA: Diagnosis not present

## 2022-07-07 DIAGNOSIS — R440 Auditory hallucinations: Secondary | ICD-10-CM | POA: Diagnosis not present

## 2022-07-07 DIAGNOSIS — R441 Visual hallucinations: Secondary | ICD-10-CM | POA: Diagnosis not present

## 2022-07-07 DIAGNOSIS — E785 Hyperlipidemia, unspecified: Secondary | ICD-10-CM | POA: Diagnosis not present

## 2022-07-07 DIAGNOSIS — N390 Urinary tract infection, site not specified: Secondary | ICD-10-CM | POA: Diagnosis not present

## 2022-07-07 DIAGNOSIS — I4891 Unspecified atrial fibrillation: Secondary | ICD-10-CM | POA: Diagnosis not present

## 2022-07-07 DIAGNOSIS — R001 Bradycardia, unspecified: Secondary | ICD-10-CM | POA: Diagnosis not present

## 2022-07-07 DIAGNOSIS — G903 Multi-system degeneration of the autonomic nervous system: Secondary | ICD-10-CM | POA: Diagnosis not present

## 2022-07-07 DIAGNOSIS — F339 Major depressive disorder, recurrent, unspecified: Secondary | ICD-10-CM | POA: Diagnosis not present

## 2022-07-07 DIAGNOSIS — E039 Hypothyroidism, unspecified: Secondary | ICD-10-CM | POA: Diagnosis not present

## 2022-07-07 DIAGNOSIS — Z7189 Other specified counseling: Secondary | ICD-10-CM | POA: Diagnosis not present

## 2022-07-07 DIAGNOSIS — Z9981 Dependence on supplemental oxygen: Secondary | ICD-10-CM | POA: Diagnosis not present

## 2022-07-07 DIAGNOSIS — R7881 Bacteremia: Secondary | ICD-10-CM | POA: Diagnosis not present

## 2022-07-07 DIAGNOSIS — J4489 Other specified chronic obstructive pulmonary disease: Secondary | ICD-10-CM | POA: Diagnosis not present

## 2022-07-07 DIAGNOSIS — Z7401 Bed confinement status: Secondary | ICD-10-CM | POA: Diagnosis not present

## 2022-07-07 DIAGNOSIS — J441 Chronic obstructive pulmonary disease with (acute) exacerbation: Secondary | ICD-10-CM | POA: Diagnosis not present

## 2022-07-07 DIAGNOSIS — E538 Deficiency of other specified B group vitamins: Secondary | ICD-10-CM | POA: Diagnosis not present

## 2022-07-07 DIAGNOSIS — J9600 Acute respiratory failure, unspecified whether with hypoxia or hypercapnia: Secondary | ICD-10-CM | POA: Diagnosis not present

## 2022-07-07 DIAGNOSIS — B9620 Unspecified Escherichia coli [E. coli] as the cause of diseases classified elsewhere: Secondary | ICD-10-CM | POA: Diagnosis not present

## 2022-07-07 DIAGNOSIS — J439 Emphysema, unspecified: Secondary | ICD-10-CM | POA: Diagnosis not present

## 2022-07-07 DIAGNOSIS — F32A Depression, unspecified: Secondary | ICD-10-CM | POA: Diagnosis not present

## 2022-07-07 DIAGNOSIS — J9621 Acute and chronic respiratory failure with hypoxia: Secondary | ICD-10-CM | POA: Diagnosis not present

## 2022-07-07 DIAGNOSIS — J9611 Chronic respiratory failure with hypoxia: Secondary | ICD-10-CM | POA: Diagnosis not present

## 2022-07-07 DIAGNOSIS — D509 Iron deficiency anemia, unspecified: Secondary | ICD-10-CM | POA: Diagnosis not present

## 2022-07-07 DIAGNOSIS — I25118 Atherosclerotic heart disease of native coronary artery with other forms of angina pectoris: Secondary | ICD-10-CM | POA: Diagnosis not present

## 2022-07-07 DIAGNOSIS — I10 Essential (primary) hypertension: Secondary | ICD-10-CM | POA: Diagnosis not present

## 2022-07-07 DIAGNOSIS — I350 Nonrheumatic aortic (valve) stenosis: Secondary | ICD-10-CM | POA: Diagnosis not present

## 2022-07-07 DIAGNOSIS — I110 Hypertensive heart disease with heart failure: Secondary | ICD-10-CM | POA: Diagnosis not present

## 2022-07-07 DIAGNOSIS — I480 Paroxysmal atrial fibrillation: Secondary | ICD-10-CM | POA: Diagnosis not present

## 2022-07-07 DIAGNOSIS — G4733 Obstructive sleep apnea (adult) (pediatric): Secondary | ICD-10-CM | POA: Diagnosis not present

## 2022-07-07 LAB — BASIC METABOLIC PANEL
Anion gap: 8 (ref 5–15)
BUN: 46 mg/dL — ABNORMAL HIGH (ref 8–23)
CO2: 36 mmol/L — ABNORMAL HIGH (ref 22–32)
Calcium: 8.6 mg/dL — ABNORMAL LOW (ref 8.9–10.3)
Chloride: 100 mmol/L (ref 98–111)
Creatinine, Ser: 1.25 mg/dL — ABNORMAL HIGH (ref 0.44–1.00)
GFR, Estimated: 43 mL/min — ABNORMAL LOW (ref >60)
Glucose, Bld: 104 mg/dL — ABNORMAL HIGH (ref 70–99)
Potassium: 3.9 mmol/L (ref 3.5–5.1)
Sodium: 144 mmol/L (ref 135–145)

## 2022-07-07 LAB — CBC
HCT: 30.6 % — ABNORMAL LOW (ref 36.0–46.0)
Hemoglobin: 9.1 g/dL — ABNORMAL LOW (ref 12.0–15.0)
MCH: 27.9 pg (ref 26.0–34.0)
MCHC: 29.7 g/dL — ABNORMAL LOW (ref 30.0–36.0)
MCV: 93.9 fL (ref 80.0–100.0)
Platelets: 324 10*3/uL (ref 150–400)
RBC: 3.26 MIL/uL — ABNORMAL LOW (ref 3.87–5.11)
RDW: 22.6 % — ABNORMAL HIGH (ref 11.5–15.5)
WBC: 7.6 10*3/uL (ref 4.0–10.5)
nRBC: 0 % (ref 0.0–0.2)

## 2022-07-07 LAB — CULTURE, BLOOD (ROUTINE X 2)

## 2022-07-07 LAB — MAGNESIUM: Magnesium: 2 mg/dL (ref 1.7–2.4)

## 2022-07-07 LAB — GLUCOSE, CAPILLARY
Glucose-Capillary: 104 mg/dL — ABNORMAL HIGH (ref 70–99)
Glucose-Capillary: 168 mg/dL — ABNORMAL HIGH (ref 70–99)

## 2022-07-07 LAB — PHOSPHORUS: Phosphorus: 3.4 mg/dL (ref 2.5–4.6)

## 2022-07-07 MED ORDER — PREDNISONE 20 MG PO TABS: 20.0000 mg | ORAL_TABLET | Freq: Every day | ORAL | 0 refills | Status: AC

## 2022-07-07 MED ORDER — PREDNISONE 10 MG PO TABS: 10.0000 mg | ORAL_TABLET | Freq: Every day | ORAL | 0 refills | Status: AC

## 2022-07-07 MED ORDER — AMIODARONE HCL 200 MG PO TABS: ORAL_TABLET | ORAL | 0 refills | Status: DC

## 2022-07-07 MED ORDER — MELATONIN 5 MG PO TABS: 5.0000 mg | ORAL_TABLET | Freq: Every day | ORAL | 0 refills | Status: DC

## 2022-07-07 MED ORDER — ACETAMINOPHEN 325 MG PO TABS: 650.0000 mg | ORAL_TABLET | Freq: Four times a day (QID) | ORAL | Status: DC | PRN

## 2022-07-07 MED ORDER — BISOPROLOL FUMARATE 5 MG PO TABS: 5.0000 mg | ORAL_TABLET | Freq: Every day | ORAL | 11 refills | Status: DC

## 2022-07-07 MED ORDER — TORSEMIDE 20 MG PO TABS: 20.0000 mg | ORAL_TABLET | ORAL | Status: DC

## 2022-07-07 MED ORDER — CEFADROXIL 500 MG PO CAPS: 1000.0000 mg | ORAL_CAPSULE | Freq: Two times a day (BID) | ORAL | 0 refills | Status: DC

## 2022-07-07 NOTE — Plan of Care (Signed)
  Problem: Education: Goal: Ability to demonstrate management of disease process will improve Outcome: Progressing Goal: Ability to verbalize understanding of medication therapies will improve Outcome: Progressing Goal: Individualized Educational Video(s) Outcome: Progressing   Problem: Activity: Goal: Capacity to carry out activities will improve Outcome: Progressing   Problem: Cardiac: Goal: Ability to achieve and maintain adequate cardiopulmonary perfusion will improve Outcome: Progressing   Problem: Education: Goal: Knowledge of disease or condition will improve Outcome: Progressing Goal: Knowledge of the prescribed therapeutic regimen will improve Outcome: Progressing Goal: Individualized Educational Video(s) Outcome: Progressing   Problem: Respiratory: Goal: Ability to maintain a clear airway will improve Outcome: Progressing Goal: Levels of oxygenation will improve Outcome: Progressing Goal: Ability to maintain adequate ventilation will improve Outcome: Progressing   Problem: Education: Goal: Ability to describe self-care measures that may prevent or decrease complications (Diabetes Survival Skills Education) will improve Outcome: Progressing Goal: Individualized Educational Video(s) Outcome: Progressing

## 2022-07-07 NOTE — Consult Note (Signed)
[facility] Customer service manager [facility]) Accountable Care Organization (ACO) [facility] Liaison Note  07/07/2022  Alexandria [name] 10-Mar-1935 [id number]  Location: [facility] RN Hospital Liaison screened the patient remotely at [facility].  Insurance: MCR ACO   Alexandria [name] is a 87 y.o. female who is a Primary Care Patient of Joaquim Nam, MD. The patient was screened for readmission hospitalization with noted extreme risk score for unplanned readmission risk with 1 IP/2 ED in 6 months.  The patient was assessed for potential [facility]) Care Management service needs for post hospital transition for care coordination. Review of patient's electronic medical record reveals patient plans on discharged to [facility]. Spoke with daughter Alexandria [name] [phone] who verified pt will go to this SNF. Introduced [facility] services and daughter receptive to a post hospital call when pt returns home. Daughter has requested the outreach call be to her mobile number as noted above. [facility] liaison will  alert PAC-RN of pt's SNF d/c today to [facility].    [facility] Care Management/Population Health does not replace or interfere with any arrangements made by the Inpatient Transition of Care team.   For questions contact:   Alexandria Cousin, RN, BSN [facility] Liaison [place]   [facility]  Population Health Office Hours MTWF 8:00 am to 6 pm off on Thursday [phone] mobile [phone] [Office toll free line][facility] Office Hours are M-F 8:30 - 5 pm 24 hour nurse advise line [phone] Conceirge  [name]

## 2022-07-07 NOTE — Plan of Care (Signed)
  Problem: Education: Goal: Ability to demonstrate management of disease process will improve Outcome: Progressing   Problem: Activity: Goal: Capacity to carry out activities will improve Outcome: Progressing   Problem: Education: Goal: Knowledge of disease or condition will improve Outcome: Progressing   Problem: Activity: Goal: Ability to tolerate increased activity will improve Outcome: Progressing   Problem: Respiratory: Goal: Ability to maintain a clear airway will improve Outcome: Progressing   Problem: Education: Goal: Ability to describe self-care measures that may prevent or decrease complications (Diabetes Survival Skills Education) will improve Outcome: Progressing   Problem: Coping: Goal: Ability to adjust to condition or change in health will improve Outcome: Progressing

## 2022-07-07 NOTE — Progress Notes (Signed)
DISCHARGE NOTE:   Pt discharged with personal belongings and IV removed. Paperwork went over with family member and no further questions or concerns. EMS providing transport and 2.5L of O2 on.

## 2022-07-07 NOTE — Discharge Summary (Signed)
[facility] Discharge Summary   Patient: Alexandria [name]:[id number]  PCP: Joaquim Nam, MD  Date of admission: 06/29/2022   Date of discharge:  07/07/2022     Discharge Diagnoses:  Principal Problem:   Acute on chronic respiratory failure with hypoxia and hypercapnia (HCC) Active Problems:   COPD exacerbation (HCC)   Acute on chronic heart failure with preserved ejection fraction (HFpEF) (HCC)   Acute encephalopathy   Hypothyroidism   Obstructive sleep apnea   Essential hypertension   Chronic diastolic CHF (congestive heart failure) (HCC)   Symptomatic anemia   Type 2 diabetes mellitus with diabetic neuropathy, without long-term current use of insulin (HCC)   Paroxysmal atrial fibrillation (HCC)   Admitted From: Home Disposition:  SNF   Recommendations for Outpatient Follow-up:  Follow with PCP, patient needs to be seen by an MD in 1 to 2 days at the facility, repeat BMP in 1 week, use torsemide every other day and titrate dose according to renal functions.   Follow with cardiologist in 1 to 2 weeks, patient needs Zio patch to monitor cardiac rhythm. Follow-up with hematologist for iron infusions as per schedule. Patient is having hallucinations and difficulty sleeping, does not want psych consult and refused any medication for hallucinations.  Patient will benefit from follow-up with psych as an outpatient if she agrees. Follow up LABS/TEST:  CBC and BMP in 1 wk. Zio patch will be mailed to her house from cardiology office.   Diet recommendation: Cardiac and Carb modified diet  Activity: The patient is advised to gradually reintroduce usual activities, as tolerated  Discharge Condition: stable  Code Status: Full code   History of present illness: As per the H and P dictated on admission  Hospital Course:  AYVEN GLASCO is a 87 y.o. female with medical history significant of COPD with chronic hypoxic respiratory failure on 2.5 L of supplemental oxygen, iron  deficiency anemia on chronic transfusions, HFpEF, type 2 diabetes, hypertension, hyperlipidemia, hypothyroidism, OSA, mild aortic stenosis, who presents to the ED due to altered mental status. History obtained from patient's daughter at bedside, [name], due to patient's altered mental status.  Approximately 10 days ago, patient's lethargy and fatigue have become significantly worse.  [name] states that patient would regularly fall asleep throughout the day.  In addition, patient felt generally weak.  They believe that it may be due to her anemia and had a follow-up with her oncologist.  She had a iron transfusion on 4/17 and blood transfusion on 4/18.  Despite this, lethargy continued to worsen.  In addition, patient had a fever on 4/21 in 4/22.  They went to an oncology follow-up today and was sent to the ED.     ED workup: Hypoxic respiratory failure, O2 sats dropped to 73%, patient was placed on oxygen and BiPAP in the ED Leukocytosis, WBC count elevated CT head negative for any acute findings, CXR demonstrated cardiomegaly with vascular congestion.  [facility] hospitalist consulted for admission and further management as below.   Assessment and Plan:   # Metabolic encephalopathy, multifactorial could be due to hypoxic and hypercapnic respiratory failure, UTI and A-fib with RVR.  Encephalopathy resolved, patient is back to her baseline. Continue supportive care and management as below. 4/27 patient mentioned about seeing and talking to the people who do not exist.  It is possible that patient might be having hallucination which is causing insomnia and encephalopathy.     # Acute hypoxic and hypercapnic respiratory failure most likely  COPD exacerbation and OSA, hypoventilation syndrome due to obesity ABG reviewed, improved after BiPAP. S/p BiPAP overnight, use BiPAP as needed S/p Solu-Medrol 125 mg x 1 dose given, s/p prednisone 40 mg p.o.d x 4 days 4/27 still mild wheezing noticed, started prednisone 30 mg  for 3 days, 20 mg for 3 days and 10 mg for 3 days. Resumed Brovana nebulizer twice daily, continue Pulmicort nebulizer twice daily. S/p DuoNeb every 6 hourly scheduled, changed to as needed after improvement. S/p doxycycline 100 mg BID x days, prophylaxis for atypical infection. Patient was advised to follow with pulmonology as an outpatient, patient may need BiPAP for home use versus sleep study and CPAP.   # Transient paroxysmal A-fib with RVR, resolved, converted back to normal sinus rhythm. S/p amnio bolus was given, IV infusion was discontinued. Started  amiodarone 200 mg p.o. BID. S/p Lopressor 5 mg IV given, Patient is not a good candidate for anticoagulation due to risk of fall, anemia requiring transfusions. 4/25 started bisoprolol 5 mg. Cardiology consulted, recommended Zio patch, which will be sent to her house. Continue amiodarone 200 mg twice daily for 2 weeks followed by 200 mg p.o. daily.  And continue bisoprolol 5 mg p.o. daily   # Chronic diastolic heart failure BNP 245, slightly elevated, S/p Lasix 40 mg IV daily, Held torsemide 20 mg home dose. As per cardiology resume torsemide 20 mg on discharge.so from 4/28 --4/30 Torsemide 20 mg p.o. daily home dose x 3 days was given.  Noticed creatinine slightly elevated so changed torsemide to every other day on discharge.  Monitor BMP and volume status and then titrate dose of torsemide accordingly.   # UTI, and E. coli bacteremia UA positive, urine culture growing E. coli, sensitive to ceftriaxone, cefazolin and ciprofloxacin. S/p ceftriaxone 2 g IV daily. Blood culture positive for E. coli, pansensitive. On 4/29 d/w ID recommended total 10 days of antibiotics, switched to cefadroxil 1 g BID on 4/30 for 3 days.  4/29 repeat blood cultures NGTD.    # Anemia of chronic disease and iron deficiency Patient follows hematologist as an outpatient and getting iron infusions as an outpatient. Hb 7.6, transfuse 1 unit of PRBC due to high risk for  symptomatic anemia and prevent rehospitalization. 5/01 Hb 9.1 remained stable iron profile, iron level 43 and transferrin saturation 15%, within normal range. Recommend to continue oral iron supplement and follow with hematologist as an outpatient. 4/30 repeated iron profile as per request of patient's daughter, iron 42, transferrin saturation 13%.  Recommended to follow with hematologist for iron transfusion as an outpatient. On 06/16/22 Vitamin B12 level was 303, goal >400, vitamin B12 1000 mcg IM injection daily x 3 doses ordered followed by oral supplement.   # NIDDM T2, held home medications during hospital stay, patient was on NovoLog sliding scale.  Resumed home medications on discharge.  Monitor CBG, continue diabetic diet.   # Depression, continued Prozac and resumed BuSpar on discharge. # Hypothyroid, continue Synthroid # Possible Hallucinations? 4/27 patient stated that she is seeing different people and hearing voices.  It is questionable that she may be communicating in her dreams.  Patient's daughter thinks that she is not hallucinating, and does not want psych consult and does not want any treatment. We will continue supportive care and continue to monitor. Started melatonin 3 mg low-dose, it may help her to have a good sleep. 4/28 patient did not sleep properly last night due to hallucinations.  Does not want psych consult and  does not want any medications for that.  Discussed with patient's daughter as well who does not want to involve psych at this time. 4/29 still having visual and auditory hallucinations, does not want psych consult and does not want any treatment. 4/30 patient is still having hallucinations as per caregiver but she denied to me.  Body mass index is 37.9 kg/m.  Nutrition Interventions:   Patient was seen by physical therapy, who recommended Therapy, SNF placement, which was arranged. On the day of the discharge the patient's vitals were stable, and no other  acute medical condition were reported by patient. the patient was felt safe to be discharge at [facility] .  Consultants: Cardiology consulted  Discussed with ID Procedures: None  Discharge Exam: General: Appear in no distress, no Rash; Oral Mucosa Clear, moist. Cardiovascular: S1 and S2 Present, no Murmur, Respiratory: normal respiratory effort, Bilateral Air entry present and no Crackles, no wheezes Abdomen: Bowel Sound present, Soft and no tenderness, no hernia Extremities: no Pedal edema, no calf tenderness Neurology: alert and oriented to time, place, and person affect appropriate.  Filed Weights   07/03/22 0714 07/05/22 0707 07/06/22 0733  Weight: 73.5 kg 98.8 kg 103.3 kg   Vitals:   07/07/22 0726 07/07/22 0736  BP:  (!) 132/45  Pulse:  (!) 59  Resp:  20  Temp:  98.4 F (36.9 C)  SpO2: 98% 100%    DISCHARGE MEDICATION: Allergies as of 07/07/2022       Reactions   Antihistamines, Diphenhydramine-type Other (See Comments)   Able to tolerate only allegra.    Sulfonamide Derivatives Swelling   REACTION: closed throat   Incruse Ellipta [umeclidinium Bromide] Cough   Caused voice change and severe coughing   Clarithromycin Other (See Comments)   REACTION: diff swallowing and mouth blisters   Codeine Other (See Comments)   Unknown    Lyrica [pregabalin] Other (See Comments)   Lack of effect for neuropathy pain.     Spiriva Handihaler [tiotropium Bromide Monohydrate] Other (See Comments)   Voice changes   Vraylar [cariprazine]    intolerant        Medication List     STOP taking these medications    cyclobenzaprine 10 MG tablet Commonly known as: FLEXERIL   isosorbide mononitrate 30 MG 24 hr tablet Commonly known as: IMDUR   lidocaine 5 % Commonly known as: Lidoderm   potassium chloride 10 MEQ tablet Commonly known as: KLOR-CON       TAKE these medications    acetaminophen 325 MG tablet Commonly known as: TYLENOL Take 2 tablets (650 mg total) by mouth  every 6 (six) hours as needed for mild pain, headache, fever or moderate pain.   albuterol 108 (90 Base) MCG/ACT inhaler Commonly known as: ProAir HFA Inhale 2 puffs into the lungs every 6 (six) hours as needed for wheezing or shortness of breath. INHALE 2 PUFFS INTO THE LUNGS EVERY 6 HOURS AS NEEDED FOR WHEEZING OR SHORTNESS OF BREATH   amiodarone 200 MG tablet Commonly known as: PACERONE Take 1 tablet (200 mg total) by mouth 2 (two) times daily for 7 days, THEN 1 tablet (200 mg total) daily. Start taking on: Jul 07, 2022   arformoterol 15 MCG/2ML Nebu Commonly known as: BROVANA Take 2 mLs (15 mcg total) by nebulization at bedtime.   B-12 5000 MCG Caps Take 5,000 mcg by mouth daily.   bisoprolol 5 MG tablet Commonly known as: ZEBETA Take 1 tablet (5 mg total) by mouth daily.  Start taking on: Jul 08, 2022   budesonide 0.5 MG/2ML nebulizer solution Commonly known as: Pulmicort Take 2 mLs (0.5 mg total) by nebulization in the morning and at bedtime.   busPIRone 15 MG tablet Commonly known as: BUSPAR Take 1 tablet (15 mg total) by mouth 2 (two) times daily. With extra 1/2 tab midday What changed: when to take this   cefadroxil 500 MG capsule Commonly known as: DURICEF Take 2 capsules (1,000 mg total) by mouth 2 (two) times daily for 3 doses.   Coenzyme Q-10 200 MG Caps Take 200 mg by mouth daily.   esomeprazole 40 MG capsule Commonly known as: NexIUM Take 1 capsule (40 mg total) by mouth daily at 12 noon.   ezetimibe 10 MG tablet Commonly known as: ZETIA TAKE 1 TABLET BY MOUTH EVERYDAY AT BEDTIME   ferrous sulfate 325 (65 FE) MG tablet TAKE 1 TABLET BY MOUTH EVERY DAY   fexofenadine 180 MG tablet Commonly known as: ALLEGRA Take 180 mg by mouth as needed.   FLUoxetine 20 MG tablet Commonly known as: PROZAC Take 20 mg by mouth 2 (two) times daily.   fluticasone 50 MCG/ACT nasal spray Commonly known as: FLONASE USE 2 SPRAYS INTO THE NOSE EVERY 12 HOURS AS NEEDED  FOR STUFFY NOSE   guaiFENesin 600 MG 12 hr tablet Commonly known as: Mucinex Take 2 tablets (1,200 mg total) by mouth 2 (two) times daily as needed for cough or to loosen phlegm.   levothyroxine 125 MCG tablet Commonly known as: SYNTHROID Take 1 tablet (125 mcg total) by mouth daily.   magnesium 30 MG tablet Take 30 mg by mouth 2 (two) times daily.   melatonin 5 MG Tabs Take 1 tablet (5 mg total) by mouth at bedtime.   metFORMIN 500 MG tablet Commonly known as: GLUCOPHAGE Take 1 tablet (500 mg total) by mouth every evening.   nitroGLYCERIN 0.4 MG SL tablet Commonly known as: NITROSTAT Place 1 tablet (0.4 mg total) under the tongue every 5 (five) minutes as needed for chest pain.   nystatin 100000 UNIT/ML suspension Commonly known as: MYCOSTATIN Take 5 mLs (500,000 Units total) by mouth 4 (four) times daily.   OneTouch Delica Lancets 33G Misc Use 1x a day   OneTouch Verio test strip Generic drug: glucose blood USE AS INSTRUCTED TO CHECK SUGAR 1 TIME DAILY   predniSONE 20 MG tablet Commonly known as: DELTASONE Take 1 tablet (20 mg total) by mouth daily with breakfast for 2 days. Start taking on: Jul 08, 2022   predniSONE 10 MG tablet Commonly known as: DELTASONE Take 1 tablet (10 mg total) by mouth daily with breakfast for 3 days. Start taking on: Jul 10, 2022   rosuvastatin 10 MG tablet Commonly known as: CRESTOR Take 1 tablet (10 mg total) by mouth every other day.   Spacer/Aero Chamber [employer] Use with  inhaler as needed.  J44.9   torsemide 20 MG tablet Commonly known as: DEMADEX Take 1 tablet (20 mg total) by mouth every other day. Check renal functions and volume status to titrate the dose accordingly. What changed:  when to take this additional instructions   Vitamin D 50 MCG (2000 UT) tablet Take 1 tablet (2,000 Units total) by mouth daily.       Allergies  Allergen Reactions   Antihistamines, Diphenhydramine-Type Other (See Comments)     Able to tolerate only allegra.    Sulfonamide Derivatives Swelling    REACTION: closed throat   Incruse Ellipta [Umeclidinium  Bromide] Cough    Caused voice change and severe coughing   Clarithromycin Other (See Comments)    REACTION: diff swallowing and mouth blisters   Codeine Other (See Comments)    Unknown    Lyrica [Pregabalin] Other (See Comments)    Lack of effect for neuropathy pain.     Spiriva Handihaler [Tiotropium Bromide Monohydrate] Other (See Comments)    Voice changes   Vraylar [Cariprazine]     intolerant   Discharge Instructions     Call MD for:  difficulty breathing, headache or visual disturbances   Complete by: As directed    Call MD for:  extreme fatigue   Complete by: As directed    Call MD for:  persistant dizziness or light-headedness   Complete by: As directed    Call MD for:  severe uncontrolled pain   Complete by: As directed    Call MD for:  temperature >100.4   Complete by: As directed    Diet - low sodium heart healthy   Complete by: As directed    Discharge instructions   Complete by: As directed    Follow with PCP, patient needs to be seen by an MD in 1 to 2 days at the facility, repeat BMP in 1 week, use torsemide every other day and titrate dose according to renal functions.   Follow with cardiologist in 1 to 2 weeks, patient needs Zio patch to monitor cardiac rhythm. Follow-up with hematologist for iron infusions as per schedule. Patient is having hallucinations and difficulty sleeping, does not want psych consult and refused any medication for hallucinations.  Patient will benefit from follow-up with psych as an outpatient if she agrees.   Increase activity slowly   Complete by: As directed        The results of significant diagnostics from this hospitalization (including imaging, microbiology, ancillary and laboratory) are listed below for reference.    Significant Diagnostic Studies: DG Chest Port 1 View  Result Date:  07/05/2022 CLINICAL DATA:  Dyspnea on exertion EXAM: PORTABLE CHEST 1 VIEW COMPARISON:  06/29/2022 FINDINGS: Enlarged cardiopericardial silhouette with vascular congestion. Tiny pleural effusions. No pneumothorax. Calcified aorta. Overlapping cardiac leads. Film is under penetrated IMPRESSION: Enlarged cardiopericardial silhouette with vascular congestion. Tiny effusions. Calcified aorta Electronically Signed   By: Karen Kays M.D.   On: 07/05/2022 17:58   ECHOCARDIOGRAM COMPLETE  Result Date: 07/01/2022    ECHOCARDIOGRAM REPORT   Patient Name:   SPRING SAN Date of Exam: 06/30/2022 Medical Rec #:  [id number]       Height:       65.0 in Accession #:    [id number]      Weight:       230.6 lb Date of Birth:  1935-03-22       BSA:          2.102 m Patient Age:    86 years        BP:           118/55 mmHg Patient Gender: F               HR:           72 bpm. Exam Location:  [facility] Procedure: 2D Echo, Cardiac Doppler, Color Doppler and Intracardiac            Opacification Agent Indications:     I48.91 Atrial Fibrillation  History:         Patient has prior history of Echocardiogram examinations,  most                  recent 03/29/2022. COPD; Risk Factors:Hypertension, Diabetes and                  Dyslipidemia. Obstructive sleep apnea. Hypothyroidism.  Sonographer:     Daphine Deutscher RDCS Referring Phys:  [id number] Lanney Gins FOUST Diagnosing Phys: Julien Nordmann MD IMPRESSIONS  1. Left ventricular ejection fraction, by estimation, is 60 to 65%. The left ventricle has normal function. The left ventricle has no regional wall motion abnormalities. Left ventricular diastolic parameters are indeterminate.  2. Right ventricular systolic function is normal. The right ventricular size is normal.  3. The mitral valve is normal in structure. Mild mitral valve regurgitation. No evidence of mitral stenosis. Moderate mitral annular calcification.  4. The aortic valve is normal in structure. Aortic valve regurgitation is not  visualized. Mild aortic valve stenosis. Aortic valve area, by VTI measures 1.64 cm. Aortic valve mean gradient measures 15.0 mmHg. Aortic valve Vmax measures 2.32 m/s.  5. The inferior vena cava is normal in size with greater than 50% respiratory variability, suggesting right atrial pressure of 3 mmHg. FINDINGS  Left Ventricle: Left ventricular ejection fraction, by estimation, is 60 to 65%. The left ventricle has normal function. The left ventricle has no regional wall motion abnormalities. Definity contrast agent was given IV to delineate the left ventricular  endocardial borders. The left ventricular internal cavity size was normal in size. There is no left ventricular hypertrophy. Left ventricular diastolic parameters are indeterminate. Right Ventricle: The right ventricular size is normal. No increase in right ventricular wall thickness. Right ventricular systolic function is normal. Left Atrium: Left atrial size was normal in size. Right Atrium: Right atrial size was normal in size. Pericardium: There is no evidence of pericardial effusion. Mitral Valve: The mitral valve is normal in structure. Moderate mitral annular calcification. Mild mitral valve regurgitation. No evidence of mitral valve stenosis. Tricuspid Valve: The tricuspid valve is normal in structure. Tricuspid valve regurgitation is not demonstrated. No evidence of tricuspid stenosis. Aortic Valve: The aortic valve is normal in structure. Aortic valve regurgitation is not visualized. Mild aortic stenosis is present. Aortic valve mean gradient measures 15.0 mmHg. Aortic valve peak gradient measures 21.5 mmHg. Aortic valve area, by VTI measures 1.64 cm. Pulmonic Valve: The pulmonic valve was normal in structure. Pulmonic valve regurgitation is not visualized. No evidence of pulmonic stenosis. Aorta: The aortic root is normal in size and structure. Venous: The inferior vena cava is normal in size with greater than 50% respiratory variability,  suggesting right atrial pressure of 3 mmHg. IAS/Shunts: No atrial level shunt detected by color flow Doppler.  LEFT VENTRICLE PLAX 2D LVIDd:         4.90 cm   Diastology LVIDs:         3.30 cm   LV e' medial:    9.03 cm/s LV PW:         1.00 cm   LV E/e' medial:  16.8 LV IVS:        1.00 cm   LV e' lateral:   11.60 cm/s LVOT diam:     1.80 cm   LV E/e' lateral: 13.1 LV SV:         75 LV SV Index:   36 LVOT Area:     2.54 cm  RIGHT VENTRICLE             IVC RV Basal diam:  3.70 cm     IVC diam: 1.70 cm RV S prime:     12.15 cm/s TAPSE (M-mode): 2.1 cm LEFT ATRIUM             Index        RIGHT ATRIUM           Index LA diam:        5.10 cm 2.43 cm/m   RA Area:     13.00 cm LA Vol (A2C):   93.2 ml 44.35 ml/m  RA Volume:   29.60 ml  14.09 ml/m LA Vol (A4C):   68.3 ml 32.50 ml/m LA Biplane Vol: 80.8 ml 38.45 ml/m  AORTIC VALVE AV Area (Vmax):    1.61 cm AV Area (Vmean):   1.49 cm AV Area (VTI):     1.64 cm AV Vmax:           231.75 cm/s AV Vmean:          166.250 cm/s AV VTI:            0.458 m AV Peak Grad:      21.5 mmHg AV Mean Grad:      15.0 mmHg LVOT Vmax:         147.00 cm/s LVOT Vmean:        97.100 cm/s LVOT VTI:          0.296 m LVOT/AV VTI ratio: 0.65  AORTA Ao Root diam: 3.00 cm MITRAL VALVE MV Area (PHT): 3.29 cm     SHUNTS MV Decel Time: 231 msec     Systemic VTI:  0.30 m MV E velocity: 152.00 cm/s  Systemic Diam: 1.80 cm MV A velocity: 120.75 cm/s MV E/A ratio:  1.26 Julien Nordmann MD Electronically signed by Julien Nordmann MD Signature Date/Time: 07/01/2022/7:52:14 AM    Final    CT Head Wo Contrast  Result Date: 06/29/2022 CLINICAL DATA:  Mental status change, unknown cause EXAM: CT HEAD WITHOUT CONTRAST TECHNIQUE: Contiguous axial images were obtained from the base of the skull through the vertex without intravenous contrast. RADIATION DOSE REDUCTION: This exam was performed according to the departmental dose-optimization program which includes automated exposure control, adjustment of  the mA and/or kV according to patient size and/or use of iterative reconstruction technique. COMPARISON:  CT head April 30, 2021. FINDINGS: Brain: No evidence of acute large vascular territory infarction, hemorrhage, hydrocephalus, extra-axial collection or mass lesion/mass effect. Patchy white matter hypodensities, nonspecific but compatible with chronic microvascular ischemic disease. Vascular: No hyperdense vessel. Skull: No acute fracture. Sinuses/Orbits: Mild paranasal sinus mucosal thickening. No acute orbital findings. Other: No mastoid effusions. IMPRESSION: No evidence of acute intracranial abnormality. Electronically Signed   By: Feliberto Harts M.D.   On: 06/29/2022 15:35   DG Chest Port 1 View  Result Date: 06/29/2022 CLINICAL DATA:  Sepsis EXAM: PORTABLE CHEST 1 VIEW COMPARISON:  X-ray 02/24/2022 and older.  CT scan 03/19/2022 FINDINGS: Enlarged cardiopericardial silhouette with some vascular congestion. Calcified aorta. No consolidation, pneumothorax or effusion. No edema. Films are under penetrated IMPRESSION: Enlarged cardiopericardial silhouette with vascular congestion. Calcified and tortuous aorta Electronically Signed   By: Karen Kays M.D.   On: 06/29/2022 15:06    Microbiology: Recent Results (from the past 240 hour(s))  Blood culture (routine x 2)     Status: None   Collection Time: 06/29/22  2:38 PM   Specimen: BLOOD  Result Value Ref Range Status   Specimen Description BLOOD BLOOD RIGHT ARM  Final  Special Requests   Final    BOTTLES DRAWN AEROBIC AND ANAEROBIC Blood Culture adequate volume   Culture   Final    NO GROWTH 5 DAYS Performed at [facility], [address] [place], [place] [address]    Report Status 07/04/2022 FINAL  Final  Blood culture (routine x 2)     Status: Abnormal   Collection Time: 06/29/22  2:46 PM   Specimen: BLOOD  Result Value Ref Range Status   Specimen Description   Final    BLOOD BLOOD RIGHT ARM Performed at [facility], [address] [place], [place] [address]    Special Requests   Final    BOTTLES DRAWN AEROBIC AND ANAEROBIC Blood Culture adequate volume Performed at [facility], [address] [place], [place] [address]    Culture  Setup Time   Final    GRAM NEGATIVE RODS AEROBIC BOTTLE ONLY CRITICAL RESULT CALLED TO, READ BACK BY AND VERIFIED WITH: JASON ROBBINS @2225  ON 07/01/22 [facility]    Culture ESCHERICHIA COLI (A)  Final   Report Status 07/04/2022 FINAL  Final   Organism ID, Bacteria ESCHERICHIA COLI  Final      Susceptibility   Escherichia coli - MIC*    AMPICILLIN >=32 RESISTANT Resistant     CEFEPIME <=0.12 SENSITIVE Sensitive     CEFTAZIDIME <=1 SENSITIVE Sensitive     CEFTRIAXONE <=0.25 SENSITIVE Sensitive     CIPROFLOXACIN <=0.25 SENSITIVE Sensitive     GENTAMICIN <=1 SENSITIVE Sensitive     IMIPENEM <=0.25 SENSITIVE Sensitive     TRIMETH/SULFA >=320 RESISTANT Resistant     AMPICILLIN/SULBACTAM >=32 RESISTANT Resistant     PIP/TAZO <=4 SENSITIVE Sensitive     * ESCHERICHIA COLI  Blood Culture ID Panel (Reflexed)     Status: Abnormal   Collection Time: 06/29/22  2:46 PM  Result Value Ref Range Status   Enterococcus faecalis NOT DETECTED NOT DETECTED Final   Enterococcus Faecium NOT DETECTED NOT DETECTED Final   Listeria monocytogenes NOT DETECTED NOT DETECTED Final   Staphylococcus species NOT DETECTED NOT DETECTED Final   Staphylococcus aureus (BCID) NOT DETECTED NOT DETECTED Final   Staphylococcus epidermidis NOT DETECTED NOT DETECTED Final   Staphylococcus lugdunensis NOT DETECTED NOT DETECTED Final   Streptococcus species NOT DETECTED NOT DETECTED Final   Streptococcus agalactiae NOT DETECTED NOT DETECTED Final   Streptococcus pneumoniae NOT DETECTED NOT DETECTED Final   Streptococcus pyogenes NOT DETECTED NOT DETECTED Final   A.calcoaceticus-baumannii NOT DETECTED NOT DETECTED Final   Bacteroides fragilis NOT DETECTED NOT DETECTED Final    Enterobacterales DETECTED (A) NOT DETECTED Final    Comment: Enterobacterales represent a large order of gram negative bacteria, not a single organism. CRITICAL RESULT CALLED TO, READ BACK BY AND VERIFIED WITH: JASON ROBBINS @2225  ON 07/01/22 SKL    Enterobacter cloacae complex NOT DETECTED NOT DETECTED Final   Escherichia coli DETECTED (A) NOT DETECTED Final    Comment: CRITICAL RESULT CALLED TO, READ BACK BY AND VERIFIED WITH: JASON ROBBINS @2225  ON 07/01/22 SKL    Klebsiella aerogenes NOT DETECTED NOT DETECTED Final   Klebsiella oxytoca NOT DETECTED NOT DETECTED Final   Klebsiella pneumoniae NOT DETECTED NOT DETECTED Final   Proteus species NOT DETECTED NOT DETECTED Final   Salmonella species NOT DETECTED NOT DETECTED Final   Serratia marcescens NOT DETECTED NOT DETECTED Final   Haemophilus influenzae NOT DETECTED NOT DETECTED Final   Neisseria meningitidis NOT DETECTED NOT DETECTED Final   Pseudomonas  aeruginosa NOT DETECTED NOT DETECTED Final   Stenotrophomonas maltophilia NOT DETECTED NOT DETECTED Final   Candida albicans NOT DETECTED NOT DETECTED Final   Candida auris NOT DETECTED NOT DETECTED Final   Candida glabrata NOT DETECTED NOT DETECTED Final   Candida krusei NOT DETECTED NOT DETECTED Final   Candida parapsilosis NOT DETECTED NOT DETECTED Final   Candida tropicalis NOT DETECTED NOT DETECTED Final   Cryptococcus neoformans/gattii NOT DETECTED NOT DETECTED Final   CTX-M ESBL NOT DETECTED NOT DETECTED Final   Carbapenem resistance IMP NOT DETECTED NOT DETECTED Final   Carbapenem resistance KPC NOT DETECTED NOT DETECTED Final   Carbapenem resistance NDM NOT DETECTED NOT DETECTED Final   Carbapenem resist OXA 48 LIKE NOT DETECTED NOT DETECTED Final   Carbapenem resistance VIM NOT DETECTED NOT DETECTED Final    Comment: Performed at [facility], [address], [place], [place] [address]  Urine Culture     Status: Abnormal   Collection Time: 06/29/22  5:35 PM    Specimen: Urine, Random  Result Value Ref Range Status   Specimen Description   Final    URINE, RANDOM Performed at [facility], [address] [place], [place] [address]    Special Requests   Final    NONE Reflexed from [id number] Performed at [facility], [address] [place], [place] [address]    Culture >=100,000 COLONIES/mL ESCHERICHIA COLI (A)  Final   Report Status 07/02/2022 FINAL  Final   Organism ID, Bacteria ESCHERICHIA COLI (A)  Final      Susceptibility   Escherichia coli - MIC*    AMPICILLIN >=32 RESISTANT Resistant     CEFAZOLIN <=4 SENSITIVE Sensitive     CEFEPIME <=0.12 SENSITIVE Sensitive     CEFTRIAXONE <=0.25 SENSITIVE Sensitive     CIPROFLOXACIN <=0.25 SENSITIVE Sensitive     GENTAMICIN <=1 SENSITIVE Sensitive     IMIPENEM <=0.25 SENSITIVE Sensitive     NITROFURANTOIN <=16 SENSITIVE Sensitive     TRIMETH/SULFA >=320 RESISTANT Resistant     AMPICILLIN/SULBACTAM >=32 RESISTANT Resistant     PIP/TAZO <=4 SENSITIVE Sensitive     * >=100,000 COLONIES/mL ESCHERICHIA COLI  SARS Coronavirus 2 by RT PCR (hospital order, performed in [facility] hospital lab) *cepheid single result test* Anterior Nasal Swab     Status: None   Collection Time: 06/29/22  8:33 PM   Specimen: Anterior Nasal Swab  Result Value Ref Range Status   SARS Coronavirus 2 by RT PCR NEGATIVE NEGATIVE Final    Comment: (NOTE) SARS-CoV-2 target nucleic acids are NOT DETECTED.  The SARS-CoV-2 RNA is generally detectable in upper and lower respiratory specimens during the acute phase of infection. The lowest concentration of SARS-CoV-2 viral copies this assay can detect is 250 copies / mL. A negative result does not preclude SARS-CoV-2 infection and should not be used as the sole basis for treatment or other patient management decisions.  A negative result may occur with improper specimen collection / handling, submission of specimen other than nasopharyngeal swab, presence of  viral mutation(s) within the areas targeted by this assay, and inadequate number of viral copies (<250 copies / mL). A negative result must be combined with clinical observations, patient history, and epidemiological information.  Fact Sheet for Patients:   RoadLapTop.co.za  Fact Sheet for Healthcare Providers: http://kim-miller.com/  This test is not yet approved or  cleared by the [place] FDA and has been authorized for detection and/or diagnosis of SARS-CoV-2 by FDA under  an Emergency Use Authorization (EUA).  This EUA will remain in effect (meaning this test can be used) for the duration of the COVID-19 declaration under Section 564(b)(1) of the Act, 21 U.S.C. section 360bbb-3(b)(1), unless the authorization is terminated or revoked sooner.  Performed at [facility], [address] [place], [place] [address]   Respiratory (~20 pathogens) panel by PCR     Status: None   Collection Time: 06/30/22  2:49 PM   Specimen: Nasopharyngeal Swab  Result Value Ref Range Status   Adenovirus NOT DETECTED NOT DETECTED Final   Coronavirus 229E NOT DETECTED NOT DETECTED Final    Comment: (NOTE) The Coronavirus on the Respiratory Panel, DOES NOT test for the novel  Coronavirus (2019 nCoV)    Coronavirus HKU1 NOT DETECTED NOT DETECTED Final   Coronavirus NL63 NOT DETECTED NOT DETECTED Final   Coronavirus OC43 NOT DETECTED NOT DETECTED Final   Metapneumovirus NOT DETECTED NOT DETECTED Final   Rhinovirus / Enterovirus NOT DETECTED NOT DETECTED Final   Influenza A NOT DETECTED NOT DETECTED Final   Influenza B NOT DETECTED NOT DETECTED Final   Parainfluenza Virus 1 NOT DETECTED NOT DETECTED Final   Parainfluenza Virus 2 NOT DETECTED NOT DETECTED Final   Parainfluenza Virus 3 NOT DETECTED NOT DETECTED Final   Parainfluenza Virus 4 NOT DETECTED NOT DETECTED Final   Respiratory Syncytial Virus NOT DETECTED NOT DETECTED Final    Bordetella pertussis NOT DETECTED NOT DETECTED Final   Bordetella Parapertussis NOT DETECTED NOT DETECTED Final   Chlamydophila pneumoniae NOT DETECTED NOT DETECTED Final   Mycoplasma pneumoniae NOT DETECTED NOT DETECTED Final    Comment: Performed at [facility], [address], [place], [place] [address]  Culture, blood (Routine X 2) w Reflex to ID Panel     Status: None (Preliminary result)   Collection Time: 07/05/22  9:16 AM   Specimen: BLOOD  Result Value Ref Range Status   Specimen Description BLOOD Blood Culture adequate volume  Final   Special Requests   Final    BOTTLES DRAWN AEROBIC AND ANAEROBIC RIGHT ANTECUBITAL   Culture   Final    NO GROWTH 2 DAYS Performed at [facility], [address] [place], [place] [address]    Report Status PENDING  Incomplete  Culture, blood (Routine X 2) w Reflex to ID Panel     Status: None (Preliminary result)   Collection Time: 07/05/22  9:16 AM   Specimen: BLOOD  Result Value Ref Range Status   Specimen Description   Final    BLOOD Blood Culture results may not be optimal due to an excessive volume of blood received in culture bottles   Special Requests   Final    BOTTLES DRAWN AEROBIC AND ANAEROBIC BLOOD LEFT HAND   Culture   Final    NO GROWTH 2 DAYS Performed at [facility], [address] [place], [place] [address]    Report Status PENDING  Incomplete     Labs: CBC: Recent Labs  Lab 07/03/22 0629 07/04/22 0553 07/05/22 0632 07/06/22 1048 07/07/22 0718  WBC 6.7 7.7 6.6 11.6* 7.6  HGB 8.7* 8.4* 9.0* 9.3* 9.1*  HCT 29.6* 29.6* 30.3* 32.5* 30.6*  MCV 92.8 94.3 93.2 95.3 93.9  PLT 239 278 278 343 324   Basic Metabolic Panel: Recent Labs  Lab 07/01/22 0611 07/02/22 0603 07/03/22 0629 07/04/22 0553 07/05/22 0632 07/06/22 1048 07/07/22 0718  NA 140 144 141 142 143 143 144  K 4.2  4.0 4.1 4.3 3.9 3.9 3.9  CL 99 101 102 105 101 97* 100  CO2 33* 35* 32 32 34* 34* 36*  GLUCOSE 129* 120*  105* 98 107* 182* 104*  BUN 47* 48* 40* 40* 41* 41* 46*  CREATININE 1.32* 1.21* 1.11* 1.10* 1.18* 1.22* 1.25*  CALCIUM 8.5* 8.4* 8.6* 8.9 8.7* 8.7* 8.6*  MG 2.2 2.1 2.2  --   --  1.9 2.0  PHOS 2.8 2.7 3.3  --   --  3.4 3.4   Liver Function Tests: No results for input(s): "AST", "ALT", "ALKPHOS", "BILITOT", "PROT", "ALBUMIN" in the last 168 hours. No results for input(s): "LIPASE", "AMYLASE" in the last 168 hours. No results for input(s): "AMMONIA" in the last 168 hours. Cardiac Enzymes: No results for input(s): "CKTOTAL", "CKMB", "CKMBINDEX", "TROPONINI" in the last 168 hours. BNP (last 3 results) Recent Labs    02/17/22 1356 03/04/22 1537 06/29/22 1437  BNP 257.2* 88.2 245.1*   CBG: Recent Labs  Lab 07/06/22 1140 07/06/22 1703 07/06/22 2150 07/07/22 0737 07/07/22 1148  GLUCAP 153* 192* 354* 104* 168*    Time spent: 35 minutes  Signed:  Gillis Santa  [facility] 07/07/2022 12:05 PM

## 2022-07-07 NOTE — TOC Progression Note (Signed)
Transition of Care [facility]) - Progression Note    Patient Details  Name: Alexandria [name] MRN: [id number] Date of Birth: March 28, 1935  Transition of [facility] CM/SW Contact  Marlowe Sax, RN Phone Number: 07/07/2022, 1:34 PM  Clinical Narrative:    Going to room 105 at [facility] called to transport    Expected Discharge Plan: Home w Home Health Services Barriers to Discharge: Continued Medical Work up  Expected Discharge Plan and Services   Discharge Planning Services: CM Consult Post Acute Care Choice: Home Health Living arrangements for the past 2 months: Single Family Home Expected Discharge Date: 07/07/22               DME Arranged: N/A DME Agency: NA       HH Arranged: PT, OT HH Agency: [facility] Date HH Agency Contacted: 07/01/22 Time HH Agency Contacted: 1425 Representative spoke with at [facility] Agency: [place]   Social Determinants of Health (SDOH) Interventions SDOH Screenings   Food Insecurity: No Food Insecurity (06/30/2022)  Housing: Low Risk  (06/30/2022)  Transportation Needs: No Transportation Needs (06/30/2022)  Utilities: Not At Risk (06/30/2022)  Depression (PHQ2-9): Low Risk  (03/12/2022)  Tobacco Use: Medium Risk (06/30/2022)    Readmission Risk Interventions     No data to display

## 2022-07-07 NOTE — TOC Progression Note (Signed)
Transition of Care [facility]) - Progression Note    Patient Details  Name: Alexandria [name] MRN: [id number] Date of Birth: [birth year]  [facility] of [facility]) CM/SW Contact  Marlowe Sax, RN Phone Number: 07/07/2022, 9:34 AM  Clinical Narrative:    Spoke with the patient's daughter Alexandria [name] on the phone, She toured [facility] yesterday and is thrilled, the patient will dc to there today, I reiterated that Ins will have to approve otherwise she will be responsible for the bill She stated understanding, She requested that the physician call her prior to the patient discharging, I notified the Physician   Expected Discharge Plan: Home w Home Health Services Barriers to Discharge: Continued Medical Work up  Expected Discharge Plan and Services   Discharge Planning Services: CM Consult Post Acute Care Choice: Home Health Living arrangements for the past 2 months: Single Family Home                 DME Arranged: N/A DME Agency: NA       HH Arranged: PT, OT HH Agency: [facility] Date HH Agency Contacted: 07/01/22 Time HH Agency Contacted: 1425 Representative spoke with at [facility] Agency: [place]   Social Determinants of Health (SDOH) Interventions SDOH Screenings   Food Insecurity: No Food Insecurity (06/30/2022)  Housing: Low Risk  (06/30/2022)  Transportation Needs: No Transportation Needs (06/30/2022)  Utilities: Not At Risk (06/30/2022)  Depression (PHQ2-9): Low Risk  (03/12/2022)  Tobacco Use: Medium Risk (06/30/2022)    Readmission Risk Interventions     No data to display

## 2022-07-08 ENCOUNTER — Ambulatory Visit: Payer: Medicare Other | Admitting: Family Medicine

## 2022-07-08 ENCOUNTER — Inpatient Hospital Stay: Payer: Medicare Other

## 2022-07-08 ENCOUNTER — Other Ambulatory Visit: Payer: Medicare Other

## 2022-07-08 ENCOUNTER — Ambulatory Visit: Payer: Medicare Other | Admitting: Nurse Practitioner

## 2022-07-08 ENCOUNTER — Inpatient Hospital Stay: Payer: Medicare Other | Admitting: Nurse Practitioner

## 2022-07-08 LAB — BASIC METABOLIC PANEL
BUN: 40 — AB (ref 4–21)
CO2: 39 — AB (ref 13–22)
Chloride: 101 (ref 99–108)
Creatinine: 1.2 — AB (ref 0.5–1.1)
Glucose: 97
Potassium: 3.9 mEq/L (ref 3.5–5.1)
Sodium: 143 (ref 137–147)

## 2022-07-08 LAB — CULTURE, BLOOD (ROUTINE X 2): Culture: NO GROWTH

## 2022-07-08 LAB — COMPREHENSIVE METABOLIC PANEL
Albumin: 3.2 — AB (ref 3.5–5.0)
Calcium: 8.7 (ref 8.7–10.7)
Globulin: 2.5
eGFR: 45

## 2022-07-08 LAB — HEPATIC FUNCTION PANEL
ALT: 22 U/L (ref 7–35)
AST: 14 (ref 13–35)
Alkaline Phosphatase: 78 (ref 25–125)
Bilirubin, Total: 0.5

## 2022-07-09 ENCOUNTER — Inpatient Hospital Stay: Payer: Medicare Other

## 2022-07-09 ENCOUNTER — Encounter: Payer: Self-pay | Admitting: Student

## 2022-07-09 ENCOUNTER — Non-Acute Institutional Stay (SKILLED_NURSING_FACILITY): Payer: Medicare Other | Admitting: Student

## 2022-07-09 DIAGNOSIS — J4489 Other specified chronic obstructive pulmonary disease: Secondary | ICD-10-CM

## 2022-07-09 DIAGNOSIS — E1140 Type 2 diabetes mellitus with diabetic neuropathy, unspecified: Secondary | ICD-10-CM

## 2022-07-09 DIAGNOSIS — G903 Multi-system degeneration of the autonomic nervous system: Secondary | ICD-10-CM | POA: Diagnosis not present

## 2022-07-09 DIAGNOSIS — E039 Hypothyroidism, unspecified: Secondary | ICD-10-CM

## 2022-07-09 DIAGNOSIS — G9341 Metabolic encephalopathy: Secondary | ICD-10-CM

## 2022-07-09 DIAGNOSIS — D509 Iron deficiency anemia, unspecified: Secondary | ICD-10-CM

## 2022-07-09 DIAGNOSIS — I4891 Unspecified atrial fibrillation: Secondary | ICD-10-CM | POA: Diagnosis not present

## 2022-07-09 DIAGNOSIS — I480 Paroxysmal atrial fibrillation: Secondary | ICD-10-CM | POA: Diagnosis not present

## 2022-07-09 DIAGNOSIS — J439 Emphysema, unspecified: Secondary | ICD-10-CM

## 2022-07-09 DIAGNOSIS — I25118 Atherosclerotic heart disease of native coronary artery with other forms of angina pectoris: Secondary | ICD-10-CM

## 2022-07-09 DIAGNOSIS — R7881 Bacteremia: Secondary | ICD-10-CM

## 2022-07-09 DIAGNOSIS — B9620 Unspecified Escherichia coli [E. coli] as the cause of diseases classified elsewhere: Secondary | ICD-10-CM

## 2022-07-09 DIAGNOSIS — F339 Major depressive disorder, recurrent, unspecified: Secondary | ICD-10-CM

## 2022-07-09 LAB — CULTURE, BLOOD (ROUTINE X 2)

## 2022-07-09 NOTE — Progress Notes (Signed)
Provider:  Dr. Earnestine Mealing Location:  Other [place].  Nursing Home Room Number: [facility] [address] Place of Service:  SNF [phone])  PCP: Joaquim Nam, MD Patient Care Team: Joaquim Nam, MD as PCP - General Mariah Milling Tollie Pizza, MD as PCP - Cardiology (Cardiology) Mariah Milling Tollie Pizza, MD as Consulting Physician (Cardiology) Oneta Rack, NP as Nurse Practitioner (Adult Health Nurse Practitioner) Earna Coder, MD as Consulting Physician (Oncology)  Extended Emergency Contact Information Primary Emergency Contact: Garrison Columbus Address: [address] [place], [place] [address] Darden [place] of [place] Home Phone: [phone] Mobile Phone: [phone] Relation: Daughter Secondary Emergency Contact: [facility] Phone: [phone] Relation: Relative  Code Status: Full Code Goals of Care: Advanced Directive information    07/09/2022    9:43 AM  Advanced Directives  Does Patient Have a Medical Advance Directive? Yes  Type of Advance Directive Healthcare Power of Attorney  Does patient want to make changes to medical advance directive? No - Patient declined  Copy of Healthcare Power of Attorney in Chart? Yes - validated most recent copy scanned in chart (See row information)      Chief Complaint  Patient presents with  . New Admit To SNF    Admission.     HPI: Patient is a 87 y.o. female seen today for admission to  She was living at home. Her husband died January 23, 2023and she had someone staying with her eac hnight. They would help with breakfast and leave. She started needed more help in the home. She was getting more support during the day.  Late march and April there was a significant decline. CO2 levels increased and she was not acting like her normal self. They called in hospice -- and they took her off of hospice because she was no longer getting her iron infusions. She gets them often to keep her hemoglobin up.   She just completed  antibiotics for E. Coli. She is still on steroid taper until 5/5.   She uses a rollator at home. No falls at home. She slipped out of the chair when she got more ill. It was a sit to stand chair.   "Hallucinations" she is tired a lot and somewhat in between sleeping. Not new. Occurs in general.   She is chronically on 2.5 LNC.   Past Medical History:  Diagnosis Date  . Allergy, unspecified not elsewhere classified   . Anxiety   . Aortic stenosis, mild 03/30/2022   a. TTE 03/30/2022: EF 55-60, no RWMA, GR 1 DD, normal RVSF, mild aortic stenosis (mean 10, V-max 212 cm/s, DI 0.70)  . Cataract   . Chronic diastolic congestive heart failure (HCC)   . COPD (chronic obstructive pulmonary disease) (HCC) 03/08/1998   PFTs 12/12/2002 FEV 1 1.42 (64%) ratio 58 with no better after B2 and DLCO75%; PFTs 11/20/09 FEV1 1.50 (73%) ratio 50 no better after B2 with DLCO 62%; Hfa 50% 11/20/2009 >75%, 01/13/10 p coaching  . Coronary artery calcification seen on CT scan    a. 2012 Neg stress test.  . Depression 12/07/1998  . Diabetes mellitus type II 02/05/2006   Dr. Elvera Lennox with endo  . Disorders of bursae and tendons in shoulder region, unspecified    Rotator cuff syndrome, right  . E. coli bacteremia   . Esophagitis   . GERD (gastroesophageal reflux disease)   . History of UTI   . Hyperlipidemia 10/04/2000  . Hypertension 03/08/1992  .  Hypothyroidism 03/08/1968  . Iron deficiency anemia   . Lung nodule    radiation starts 10-08-2019  . Malignant neoplasm of right upper lobe of lung (HCC) 07/24/2019  . Obesity    NOS  . OSA (obstructive sleep apnea)    PSG 01/27/10 AHI 13, pt does not know CPAP settings  . Peripheral neuropathy    Likely due to DM per Dr. Sandria Manly  . Ventral hernia    Past Surgical History:  Procedure Laterality Date  . ABD U/S  03/19/1999   Nml x2 foci in liver  . ADENOSINE MYOVIEW  06/02/2007   Nml  . CARDIOLITE PERSANTINE  08/24/2000   Nml  . CAROTID U/S  08/24/2000   1-39%  ICA stenosis  . CAROTID U/S  06/02/2007   No apprec change   . CARPAL TUNNEL RELEASE  12/1997   Right  . CESAREAN SECTION     x2 Breech/ repeat  . CHOLECYSTECTOMY  1997  . COLONOSCOPY WITH PROPOFOL N/A 02/12/2016   Procedure: COLONOSCOPY WITH PROPOFOL;  Surgeon: Rachael Fee, MD;  Location: [facility];  Service: Endoscopy;  Laterality: N/A;  . CT ABD W & PELVIS WO/W CM     Abd hemangiomas of liver, 1 cm R renal cyst  . DENTAL SURGERY  2016   Implants  . DEXA  07/03/2003   Nml  . ESOPHAGOGASTRODUODENOSCOPY  12/05/1997   Nml (due to hoarseness)  . ESOPHAGOGASTRODUODENOSCOPY (EGD) WITH PROPOFOL N/A 02/12/2016   Procedure: ESOPHAGOGASTRODUODENOSCOPY (EGD) WITH PROPOFOL;  Surgeon: Rachael Fee, MD;  Location: [facility];  Service: Endoscopy;  Laterality: N/A;  . GALLBLADDER SURGERY    . HERNIA REPAIR  01/24/2009   Lap Ventr w/ Lysis of adhesions (Dr. Dwain Sarna)  . knee arthroscopic surgery  years ago   right  . ROTATOR CUFF REPAIR  1984   Right, [place]  . SHOULDER OPEN ROTATOR CUFF REPAIR  02/08/2012   Procedure: ROTATOR CUFF REPAIR SHOULDER OPEN;  Surgeon: Drucilla Schmidt, MD;  Location: [facility];  Service: Orthopedics;  Laterality: Left;  Left Shoulder Open Anterior Acrominectomy Rotator Cuff Repair Open Distal Clavicle Resection ,tissue mend graft, and repair of biceps tendon  . THUMB RELEASE  12/1997   Right  . TONSILLECTOMY    . TOTAL ABDOMINAL HYSTERECTOMY  1985   Due to dysmennorhea  . US ECHOCARDIOGRAPHY  06/02/2007    reports that she quit smoking about 17 years ago. Her smoking use included cigarettes. She has a 100.00 pack-year smoking history. She has never used smokeless tobacco. She reports current alcohol use. She reports that she does not use drugs. Social History   Socioeconomic History  . Marital status: Widowed    Spouse name: Not on file  . Number of children: 2  . Years of education: Not on file  . Highest education level: Not on file   Occupational History  . Occupation: Retired    Associate Professor: RETIRED  Tobacco Use  . Smoking status: Former    Packs/day: 2.00    Years: 50.00    Additional pack years: 0.00    Total pack years: 100.00    Types: Cigarettes    Quit date: 02/05/2005    Years since quitting: 17.4  . Smokeless tobacco: Never  Vaping Use  . Vaping Use: Never used  Substance and Sexual Activity  . Alcohol use: Yes    Comment: occasionally  . Drug use: Never  . Sexual activity: Not Currently  Other Topics Concern  . Not on  file  Social History Narrative   Widowed 2023, 2 children; Enjoys painting      Quit smoking in 2007; no alcohol; lives in [place]; [name]/d lives in [place]. Had own business. Dont drive- sec to neuropathy.    Social Determinants of Health   Financial Resource Strain: Not on file  Food Insecurity: No Food Insecurity (06/30/2022)   Hunger Vital Sign   . Worried About Programme researcher, broadcasting/film/video in the Last Year: Never true   . Ran Out of Food in the Last Year: Never true  Transportation Needs: No Transportation Needs (06/30/2022)   PRAPARE - Transportation   . Lack of Transportation (Medical): No   . Lack of Transportation (Non-Medical): No  Physical Activity: Not on file  Stress: Not on file  Social Connections: Not on file  Intimate Partner Violence: Not At Risk (06/30/2022)   Humiliation, Afraid, Rape, and Kick questionnaire   . Fear of Current or Ex-Partner: No   . Emotionally Abused: No   . Physically Abused: No   . Sexually Abused: No    Functional Status Survey:    Family History  Problem Relation Age of Onset  . Heart disease Mother   . Thyroid disease Mother   . Emphysema Father        One lung  . Cystic fibrosis Sister   . Hyperthyroidism Sister   . Osteoarthritis Brother   . Hyperthyroidism Brother   . Esophageal cancer Neg Hx   . Cancer Neg Hx        Head or neck  . Colon cancer Neg Hx   . Stomach cancer Neg Hx   . Breast cancer Neg Hx     Health  Maintenance  Topic Date Due  . Zoster Vaccines- Shingrix (1 of 2) Never done  . Diabetic kidney evaluation - Urine ACR  01/12/2008  . Medicare Annual Wellness (AWV)  02/04/2021  . OPHTHALMOLOGY EXAM  06/24/2022  . HEMOGLOBIN A1C  09/14/2022  . INFLUENZA VACCINE  10/07/2022  . FOOT EXAM  10/17/2022  . Diabetic kidney evaluation - eGFR measurement  07/08/2023  . DTaP/Tdap/Td (7 - Td or Tdap) 05/01/2031  . Pneumonia Vaccine 33+ Years old  Completed  . DEXA SCAN  Completed  . HPV VACCINES  Aged Out  . COVID-19 Vaccine  Discontinued    Allergies  Allergen Reactions  . Antihistamines, Diphenhydramine-Type Other (See Comments)    Able to tolerate only allegra.   . Sulfonamide Derivatives Swelling    REACTION: closed throat  . Incruse Ellipta [Umeclidinium Bromide] Cough    Caused voice change and severe coughing  . Clarithromycin Other (See Comments)    REACTION: diff swallowing and mouth blisters  . Codeine Other (See Comments)    Unknown   . Lyrica [Pregabalin] Other (See Comments)    Lack of effect for neuropathy pain.    Marland Kitchen Spiriva Handihaler [Tiotropium Bromide Monohydrate] Other (See Comments)    Voice changes  . Vraylar [Cariprazine]     intolerant    Outpatient Encounter Medications as of 07/09/2022  Medication Sig  . acetaminophen (TYLENOL) 325 MG tablet Take 2 tablets (650 mg total) by mouth every 6 (six) hours as needed for mild pain, headache, fever or moderate pain.  Marland Kitchen albuterol (PROAIR HFA) 108 (90 Base) MCG/ACT inhaler Inhale 2 puffs into the lungs every 6 (six) hours as needed for wheezing or shortness of breath. INHALE 2 PUFFS INTO THE LUNGS EVERY 6 HOURS AS NEEDED FOR WHEEZING OR SHORTNESS OF  BREATH  . amiodarone (PACERONE) 200 MG tablet Take 1 tablet (200 mg total) by mouth 2 (two) times daily for 7 days, THEN 1 tablet (200 mg total) daily.  Marland Kitchen arformoterol (BROVANA) 15 MCG/2ML NEBU Take 2 mLs (15 mcg total) by nebulization at bedtime.  . bisoprolol (ZEBETA) 5 MG  tablet Take 1 tablet (5 mg total) by mouth daily.  . budesonide (PULMICORT) 0.5 MG/2ML nebulizer solution Take 2 mLs (0.5 mg total) by nebulization in the morning and at bedtime.  . busPIRone (BUSPAR) 15 MG tablet Take 1 tablet (15 mg total) by mouth 2 (two) times daily. With extra 1/2 tab midday  . Cholecalciferol (VITAMIN D) 50 MCG (2000 UT) tablet Take 1 tablet (2,000 Units total) by mouth daily.  . Coenzyme Q-10 200 MG CAPS Take 200 mg by mouth daily.  . Cyanocobalamin (B-12) 5000 MCG CAPS Take 5,000 mcg by mouth daily.  Marland Kitchen esomeprazole (NEXIUM) 40 MG capsule Take 1 capsule (40 mg total) by mouth daily at 12 noon.  . ezetimibe (ZETIA) 10 MG tablet TAKE 1 TABLET BY MOUTH EVERYDAY AT BEDTIME  . ferrous sulfate 325 (65 FE) MG tablet TAKE 1 TABLET BY MOUTH EVERY DAY  . fexofenadine (ALLEGRA) 180 MG tablet Take 180 mg by mouth as needed.  Marland Kitchen FLUoxetine (PROZAC) 20 MG tablet Take 20 mg by mouth 2 (two) times daily.  . fluticasone (FLONASE) 50 MCG/ACT nasal spray USE 2 SPRAYS INTO THE NOSE EVERY 12 HOURS AS NEEDED FOR STUFFY NOSE  . glucose blood (ONETOUCH VERIO) test strip USE AS INSTRUCTED TO CHECK SUGAR 1 TIME DAILY  . guaiFENesin (MUCINEX) 600 MG 12 hr tablet Take 2 tablets (1,200 mg total) by mouth 2 (two) times daily as needed for cough or to loosen phlegm.  Marland Kitchen levothyroxine (SYNTHROID) 125 MCG tablet Take 1 tablet (125 mcg total) by mouth daily.  . magnesium 30 MG tablet Take 30 mg by mouth 2 (two) times daily.  . melatonin 5 MG TABS Take 1 tablet (5 mg total) by mouth at bedtime.  . metFORMIN (GLUCOPHAGE) 500 MG tablet Take 1 tablet (500 mg total) by mouth every evening.  . nitroGLYCERIN (NITROSTAT) 0.4 MG SL tablet Place 1 tablet (0.4 mg total) under the tongue every 5 (five) minutes as needed for chest pain.  Marland Kitchen nystatin (MYCOSTATIN) 100000 UNIT/ML suspension Take 5 mLs (500,000 Units total) by mouth 4 (four) times daily.  Letta Pate Delica Lancets 33G MISC Use 1x a day  . OXYGEN Inhale into  the lungs. 2lpm  . [START ON 07/10/2022] predniSONE (DELTASONE) 10 MG tablet Take 1 tablet (10 mg total) by mouth daily with breakfast for 3 days.  . predniSONE (DELTASONE) 20 MG tablet Take 1 tablet (20 mg total) by mouth daily with breakfast for 2 days.  . rosuvastatin (CRESTOR) 10 MG tablet Take 1 tablet (10 mg total) by mouth every other day.  Marland Kitchen Spacer/Aero Chamber [employer] Use with  inhaler as needed.  J44.9  . torsemide (DEMADEX) 20 MG tablet Take 1 tablet (20 mg total) by mouth every other day. Check renal functions and volume status to titrate the dose accordingly.  . Zinc Oxide (TRIPLE PASTE) 12.8 % ointment Apply 1 Application topically as needed for irritation. Every shift.  . [DISCONTINUED] cefadroxil (DURICEF) 500 MG capsule Take 2 capsules (1,000 mg total) by mouth 2 (two) times daily for 3 doses.   No facility-administered encounter medications on file as of 07/09/2022.    Review of Systems  Vitals:  07/09/22 0926  BP: 137/78  Pulse: (!) 50  Resp: 20  Temp: 97.6 F (36.4 C)  SpO2: 94%  Weight: 220 lb 8 oz (100 kg)  Height: 5\' 5"  (1.651 m)   Body mass index is 36.69 kg/m. Physical Exam  Labs reviewed: Basic Metabolic Panel: Recent Labs    07/03/22 0629 07/04/22 0553 07/05/22 0632 07/06/22 1048 07/07/22 0718 07/08/22 0000  NA 141   < > 143 143 144 143  K 4.1   < > 3.9 3.9 3.9 3.9  CL 102   < > 101 97* 100 101  CO2 32   < > 34* 34* 36* 39*  GLUCOSE 105*   < > 107* 182* 104*  --   BUN 40*   < > 41* 41* 46* 40*  CREATININE 1.11*   < > 1.18* 1.22* 1.25* 1.2*  CALCIUM 8.6*   < > 8.7* 8.7* 8.6* 8.7  MG 2.2  --   --  1.9 2.0  --   PHOS 3.3  --   --  3.4 3.4  --    < > = values in this interval not displayed.   Liver Function Tests: Recent Labs    03/10/22 1446 06/29/22 1302 06/29/22 1437 07/08/22 0000  AST 17 16 16 14   ALT 12 15 16 22   ALKPHOS 84 104 100 78  BILITOT 0.4 0.5 0.8  --   PROT 7.2 6.5 6.3*  --   ALBUMIN 3.7 2.9* 2.9* 3.2*   No  results for input(s): "LIPASE", "AMYLASE" in the last 8760 hours. No results for input(s): "AMMONIA" in the last 8760 hours. CBC: Recent Labs    06/29/22 1302 06/29/22 1437 06/30/22 0631 07/01/22 0611 07/05/22 0632 07/06/22 1048 07/07/22 0718  WBC 12.5* 12.3* 8.3   < > 6.6 11.6* 7.6  NEUTROABS 11.3* 11.0* 8.1*  --   --   --   --   HGB 7.7* 8.1* 8.3*   < > 9.0* 9.3* 9.1*  HCT 26.7* 28.4* 29.8*   < > 30.3* 32.5* 30.6*  MCV 100.4* 101.1* 103.8*   < > 93.2 95.3 93.9  PLT 125* 136* 131*   < > 278 343 324   < > = values in this interval not displayed.   Cardiac Enzymes: No results for input(s): "CKTOTAL", "CKMB", "CKMBINDEX", "TROPONINI" in the last 8760 hours. BNP: Invalid input(s): "POCBNP" Lab Results  Component Value Date   HGBA1C 5.3 03/16/2022   Lab Results  Component Value Date   TSH 0.899 06/30/2022   Lab Results  Component Value Date   VITAMINB12 303 06/16/2022   Lab Results  Component Value Date   FOLATE 12.1 06/29/2022   Lab Results  Component Value Date   IRON 42 07/06/2022   TIBC 329 07/06/2022   FERRITIN 206 06/29/2022    Imaging and Procedures obtained prior to SNF admission: ECHOCARDIOGRAM COMPLETE  Result Date: 07/01/2022    ECHOCARDIOGRAM REPORT   Patient Name:   SHANELLY DELVILLAR Date of Exam: 06/30/2022 Medical Rec #:  [id number]       Height:       65.0 in Accession #:    [id number]      Weight:       230.6 lb Date of Birth:  05-Mar-1936       BSA:          2.102 m Patient Age:    86 years        BP:  118/55 mmHg Patient Gender: F               HR:           72 bpm. Exam Location:  [facility] Procedure: 2D Echo, Cardiac Doppler, Color Doppler and Intracardiac            Opacification Agent Indications:     I48.91 Atrial Fibrillation  History:         Patient has prior history of Echocardiogram examinations, most                  recent 03/29/2022. COPD; Risk Factors:Hypertension, Diabetes and                  Dyslipidemia. Obstructive sleep apnea.  Hypothyroidism.  Sonographer:     Daphine Deutscher RDCS Referring Phys:  [id number] Lanney Gins FOUST Diagnosing Phys: Julien Nordmann MD IMPRESSIONS  1. Left ventricular ejection fraction, by estimation, is 60 to 65%. The left ventricle has normal function. The left ventricle has no regional wall motion abnormalities. Left ventricular diastolic parameters are indeterminate.  2. Right ventricular systolic function is normal. The right ventricular size is normal.  3. The mitral valve is normal in structure. Mild mitral valve regurgitation. No evidence of mitral stenosis. Moderate mitral annular calcification.  4. The aortic valve is normal in structure. Aortic valve regurgitation is not visualized. Mild aortic valve stenosis. Aortic valve area, by VTI measures 1.64 cm. Aortic valve mean gradient measures 15.0 mmHg. Aortic valve Vmax measures 2.32 m/s.  5. The inferior vena cava is normal in size with greater than 50% respiratory variability, suggesting right atrial pressure of 3 mmHg. FINDINGS  Left Ventricle: Left ventricular ejection fraction, by estimation, is 60 to 65%. The left ventricle has normal function. The left ventricle has no regional wall motion abnormalities. Definity contrast agent was given IV to delineate the left ventricular  endocardial borders. The left ventricular internal cavity size was normal in size. There is no left ventricular hypertrophy. Left ventricular diastolic parameters are indeterminate. Right Ventricle: The right ventricular size is normal. No increase in right ventricular wall thickness. Right ventricular systolic function is normal. Left Atrium: Left atrial size was normal in size. Right Atrium: Right atrial size was normal in size. Pericardium: There is no evidence of pericardial effusion. Mitral Valve: The mitral valve is normal in structure. Moderate mitral annular calcification. Mild mitral valve regurgitation. No evidence of mitral valve stenosis. Tricuspid Valve: The  tricuspid valve is normal in structure. Tricuspid valve regurgitation is not demonstrated. No evidence of tricuspid stenosis. Aortic Valve: The aortic valve is normal in structure. Aortic valve regurgitation is not visualized. Mild aortic stenosis is present. Aortic valve mean gradient measures 15.0 mmHg. Aortic valve peak gradient measures 21.5 mmHg. Aortic valve area, by VTI measures 1.64 cm. Pulmonic Valve: The pulmonic valve was normal in structure. Pulmonic valve regurgitation is not visualized. No evidence of pulmonic stenosis. Aorta: The aortic root is normal in size and structure. Venous: The inferior vena cava is normal in size with greater than 50% respiratory variability, suggesting right atrial pressure of 3 mmHg. IAS/Shunts: No atrial level shunt detected by color flow Doppler.  LEFT VENTRICLE PLAX 2D LVIDd:         4.90 cm   Diastology LVIDs:         3.30 cm   LV e' medial:    9.03 cm/s LV PW:         1.00 cm   LV  E/e' medial:  16.8 LV IVS:        1.00 cm   LV e' lateral:   11.60 cm/s LVOT diam:     1.80 cm   LV E/e' lateral: 13.1 LV SV:         75 LV SV Index:   36 LVOT Area:     2.54 cm  RIGHT VENTRICLE             IVC RV Basal diam:  3.70 cm     IVC diam: 1.70 cm RV S prime:     12.15 cm/s TAPSE (M-mode): 2.1 cm LEFT ATRIUM             Index        RIGHT ATRIUM           Index LA diam:        5.10 cm 2.43 cm/m   RA Area:     13.00 cm LA Vol (A2C):   93.2 ml 44.35 ml/m  RA Volume:   29.60 ml  14.09 ml/m LA Vol (A4C):   68.3 ml 32.50 ml/m LA Biplane Vol: 80.8 ml 38.45 ml/m  AORTIC VALVE AV Area (Vmax):    1.61 cm AV Area (Vmean):   1.49 cm AV Area (VTI):     1.64 cm AV Vmax:           231.75 cm/s AV Vmean:          166.250 cm/s AV VTI:            0.458 m AV Peak Grad:      21.5 mmHg AV Mean Grad:      15.0 mmHg LVOT Vmax:         147.00 cm/s LVOT Vmean:        97.100 cm/s LVOT VTI:          0.296 m LVOT/AV VTI ratio: 0.65  AORTA Ao Root diam: 3.00 cm MITRAL VALVE MV Area (PHT): 3.29 cm      SHUNTS MV Decel Time: 231 msec     Systemic VTI:  0.30 m MV E velocity: 152.00 cm/s  Systemic Diam: 1.80 cm MV A velocity: 120.75 cm/s MV E/A ratio:  1.26 Julien Nordmann MD Electronically signed by Julien Nordmann MD Signature Date/Time: 07/01/2022/7:52:14 AM    Final     Assessment/Plan There are no diagnoses linked to this encounter.   Family/ staff Communication:   Labs/tests ordered:

## 2022-07-10 LAB — CULTURE, BLOOD (ROUTINE X 2): Specimen Description: ADEQUATE

## 2022-07-12 ENCOUNTER — Telehealth: Payer: Self-pay | Admitting: Pharmacist

## 2022-07-12 DIAGNOSIS — I5032 Chronic diastolic (congestive) heart failure: Secondary | ICD-10-CM

## 2022-07-12 DIAGNOSIS — J441 Chronic obstructive pulmonary disease with (acute) exacerbation: Secondary | ICD-10-CM

## 2022-07-12 NOTE — Telephone Encounter (Signed)
PharmD reviewed patient chart to assess eligibility for [facility] services. Patient was determined to be a good candidate for the program given the complexity of the medication regimen and overall risk for hospitalization and/or high healthcare utilization.   Referral entered in order to outreach patient and offer appointment with PharmD. Referral cosigned to PCP.  

## 2022-07-13 DIAGNOSIS — I25118 Atherosclerotic heart disease of native coronary artery with other forms of angina pectoris: Secondary | ICD-10-CM | POA: Insufficient documentation

## 2022-07-13 DIAGNOSIS — L97818 Non-pressure chronic ulcer of other part of right lower leg with other specified severity: Secondary | ICD-10-CM | POA: Insufficient documentation

## 2022-07-13 DIAGNOSIS — G903 Multi-system degeneration of the autonomic nervous system: Secondary | ICD-10-CM | POA: Insufficient documentation

## 2022-07-14 ENCOUNTER — Ambulatory Visit (HOSPITAL_COMMUNITY): Payer: Medicare Other

## 2022-07-19 ENCOUNTER — Ambulatory Visit: Payer: Medicare Other | Admitting: Radiation Oncology

## 2022-07-22 LAB — IRON,TIBC AND FERRITIN PANEL: Iron: 52

## 2022-07-22 LAB — CBC AND DIFFERENTIAL
HCT: 29 — AB (ref 36–46)
Hemoglobin: 8.9 — AB (ref 12.0–16.0)
Neutrophils Absolute: 4161
Platelets: 117 10*3/uL — AB (ref 150–400)
WBC: 5.7

## 2022-07-22 LAB — CBC: RBC: 3.09 — AB (ref 3.87–5.11)

## 2022-07-23 ENCOUNTER — Telehealth: Payer: Self-pay | Admitting: Nurse Practitioner

## 2022-07-23 NOTE — Telephone Encounter (Signed)
Daughter called to inquire about follow-up here at the cancer center. Her mom was here in APRIL 2024 and was sent to the ED, where she stayed for 9 days and then into a facility. She is being discharged on 5/22 and her daughter would like to schedule an appointment with Leotis Shames. Please advise on scheduling,labs, infusions (if any).  Thanks!

## 2022-07-26 ENCOUNTER — Non-Acute Institutional Stay (SKILLED_NURSING_FACILITY): Payer: Medicare Other | Admitting: Student

## 2022-07-26 ENCOUNTER — Encounter: Payer: Self-pay | Admitting: Student

## 2022-07-26 DIAGNOSIS — I5032 Chronic diastolic (congestive) heart failure: Secondary | ICD-10-CM | POA: Diagnosis not present

## 2022-07-26 NOTE — Progress Notes (Signed)
Location:  Other [place].  Nursing Home Room Number: [facility] [address] Place of Service:  SNF [phone]) Provider:  Dr. Earnestine Mealing  PCP: Joaquim Nam, MD  Patient Care Team: Joaquim Nam, MD as PCP - General Mariah Milling Tollie Pizza, MD as PCP - Cardiology (Cardiology) Mariah Milling Tollie Pizza, MD as Consulting Physician (Cardiology) Oneta Rack, NP as Nurse Practitioner (Adult Health Nurse Practitioner) Earna Coder, MD as Consulting Physician (Oncology)  Extended Emergency Contact Information Primary Emergency Contact: Alexandria [name] Address: [address] [place], [place] [address] [place] Home Phone: [phone] Mobile Phone: [phone] Relation: Daughter Secondary Emergency Contact: [facility] Phone: [phone] Relation: Relative  Code Status:  Full Code Goals of care: Advanced Directive information    07/26/2022    2:06 PM  Advanced Directives  Does Patient Have a Medical Advance Directive? Yes  Type of Advance Directive Healthcare Power of Attorney  Does patient want to make changes to medical advance directive? No - Patient declined  Copy of Healthcare Power of Attorney in Chart? Yes - validated most recent copy scanned in chart (See row information)     Chief Complaint  Patient presents with   Acute Visit    Weight Gain.     HPI:  Pt is a 87 y.o. female seen today for an acute visit for Weight Gain.   Usually below 230 on torsemide 10, but now she needs it at 20 mg Daily. She is okay with additional trial of metolazone to see if that can help with the diuresis. She gained 14 lbs over the weekend. Patient will likely stay on campus through the weekend to help with management of volume status. Once she has gotten closer to her baseline weight will plan for discharge.   Past Medical History:  Diagnosis Date   Allergy, unspecified not elsewhere classified    Anxiety    Aortic stenosis, mild 03/30/2022   a. TTE  03/29/2022: EF 55-60, no RWMA, GR 1 DD, normal RVSF, mild aortic stenosis (mean 10, V-max 212 cm/s, DI 0.70)   Cataract    Chronic diastolic congestive heart failure (HCC)    COPD (chronic obstructive pulmonary disease) (HCC) 03/08/1998   PFTs 12/12/2002 FEV 1 1.42 (64%) ratio 58 with no better after B2 and DLCO75%; PFTs 11/20/09 FEV1 1.50 (73%) ratio 50 no better after B2 with DLCO 62%; Hfa 50% 11/20/2009 >75%, 01/13/10 p coaching   Coronary artery calcification seen on CT scan    a. 2012 Neg stress test.   Depression 12/07/1998   Diabetes mellitus type II 02/05/2006   Dr. Elvera Lennox with endo   Disorders of bursae and tendons in shoulder region, unspecified    Rotator cuff syndrome, right   E. coli bacteremia    Esophagitis    GERD (gastroesophageal reflux disease)    History of UTI    Hyperlipidemia 10/04/2000   Hypertension 03/08/1992   Hypothyroidism 03/08/1968   Iron deficiency anemia    Lung nodule    radiation starts 10-08-2019   Malignant neoplasm of right upper lobe of lung (HCC) 07/24/2019   Obesity    NOS   OSA (obstructive sleep apnea)    PSG 01/27/10 AHI 13, pt does not know CPAP settings   Peripheral neuropathy    Likely due to DM per Dr. Tresa Endo hernia    Past Surgical History:  Procedure Laterality Date   ABD U/S  03/19/1999  Nml x2 foci in liver   ADENOSINE MYOVIEW  06/02/2007   Nml   CARDIOLITE PERSANTINE  08/24/2000   Nml   CAROTID U/S  08/24/2000   1-39% ICA stenosis   CAROTID U/S  06/02/2007   No apprec change    CARPAL TUNNEL RELEASE  12/1997   Right   CESAREAN SECTION     x2 [place]/ repeat   CHOLECYSTECTOMY  1997   COLONOSCOPY WITH PROPOFOL N/A 02/12/2016   Procedure: COLONOSCOPY WITH PROPOFOL;  Surgeon: Rachael Fee, MD;  Location: [facility];  Service: Endoscopy;  Laterality: N/A;   CT ABD W & PELVIS WO/W CM     Abd hemangiomas of liver, 1 cm R renal cyst   DENTAL SURGERY  2016   Implants   DEXA  07/03/2003   Nml    ESOPHAGOGASTRODUODENOSCOPY  12/05/1997   Nml (due to hoarseness)   ESOPHAGOGASTRODUODENOSCOPY (EGD) WITH PROPOFOL N/A 02/12/2016   Procedure: ESOPHAGOGASTRODUODENOSCOPY (EGD) WITH PROPOFOL;  Surgeon: Rachael Fee, MD;  Location: [facility];  Service: Endoscopy;  Laterality: N/A;   GALLBLADDER SURGERY     HERNIA REPAIR  01/24/2009   Lap Ventr w/ Lysis of adhesions (Dr. Dwain Sarna)   knee arthroscopic surgery  years ago   right   ROTATOR CUFF REPAIR  1984   Right, [place]   SHOULDER OPEN ROTATOR CUFF REPAIR  02/08/2012   Procedure: ROTATOR CUFF REPAIR SHOULDER OPEN;  Surgeon: Drucilla Schmidt, MD;  Location: [facility];  Service: Orthopedics;  Laterality: Left;  Left Shoulder Open Anterior Acrominectomy Rotator Cuff Repair Open Distal Clavicle Resection ,tissue mend graft, and repair of biceps tendon   THUMB RELEASE  12/1997   Right   TONSILLECTOMY     TOTAL ABDOMINAL HYSTERECTOMY  1985   Due to dysmennorhea   US ECHOCARDIOGRAPHY  06/02/2007    Allergies  Allergen Reactions   Antihistamines, Diphenhydramine-Type Other (See Comments)    Able to tolerate only allegra.    Sulfonamide Derivatives Swelling    REACTION: closed throat   Incruse Ellipta [Umeclidinium Bromide] Cough    Caused voice change and severe coughing   Clarithromycin Other (See Comments)    REACTION: diff swallowing and mouth blisters   Codeine Other (See Comments)    Unknown    Lyrica [Pregabalin] Other (See Comments)    Lack of effect for neuropathy pain.     Spiriva Handihaler [Tiotropium Bromide Monohydrate] Other (See Comments)    Voice changes   Vraylar [Cariprazine]     intolerant    Outpatient Encounter Medications as of 07/26/2022  Medication Sig   acetaminophen (TYLENOL) 325 MG tablet Take 2 tablets (650 mg total) by mouth every 6 (six) hours as needed for mild pain, headache, fever or moderate pain.   albuterol (PROAIR HFA) 108 (90 Base) MCG/ACT inhaler Inhale 2 puffs into the lungs every 6 (six)  hours as needed for wheezing or shortness of breath. INHALE 2 PUFFS INTO THE LUNGS EVERY 6 HOURS AS NEEDED FOR WHEEZING OR SHORTNESS OF BREATH   amiodarone (PACERONE) 200 MG tablet Take 200 mg by mouth daily.   arformoterol (BROVANA) 15 MCG/2ML NEBU Take 2 mLs (15 mcg total) by nebulization at bedtime.   bisoprolol (ZEBETA) 5 MG tablet Take 1 tablet (5 mg total) by mouth daily.   budesonide (PULMICORT) 0.5 MG/2ML nebulizer solution Take 2 mLs (0.5 mg total) by nebulization in the morning and at bedtime.   busPIRone (BUSPAR) 15 MG tablet Take 15 mg by mouth 3 (three)  times daily.   Cholecalciferol (VITAMIN D) 50 MCG (2000 UT) tablet Take 1 tablet (2,000 Units total) by mouth daily.   Coenzyme Q-10 200 MG CAPS Take 200 mg by mouth daily.   Cyanocobalamin (B-12) 5000 MCG CAPS Take 5,000 mcg by mouth daily.   esomeprazole (NEXIUM) 40 MG capsule Take 1 capsule (40 mg total) by mouth daily at 12 noon.   ezetimibe (ZETIA) 10 MG tablet TAKE 1 TABLET BY MOUTH EVERYDAY AT BEDTIME   ferrous sulfate 325 (65 FE) MG tablet TAKE 1 TABLET BY MOUTH EVERY DAY   FLUoxetine (PROZAC) 20 MG tablet Take 20 mg by mouth 2 (two) times daily.   glucose blood (ONETOUCH VERIO) test strip USE AS INSTRUCTED TO CHECK SUGAR 1 TIME DAILY   guaiFENesin (MUCINEX) 600 MG 12 hr tablet Take 2 tablets (1,200 mg total) by mouth 2 (two) times daily as needed for cough or to loosen phlegm.   levothyroxine (SYNTHROID) 125 MCG tablet Take 1 tablet (125 mcg total) by mouth daily.   magnesium 30 MG tablet Take 30 mg by mouth 2 (two) times daily.   melatonin 5 MG TABS Take 1 tablet (5 mg total) by mouth at bedtime.   metFORMIN (GLUCOPHAGE) 500 MG tablet Take 1 tablet (500 mg total) by mouth every evening.   metolazone (ZAROXOLYN) 2.5 MG tablet Take 2.5 mg by mouth daily.   nitroGLYCERIN (NITROSTAT) 0.4 MG SL tablet Place 1 tablet (0.4 mg total) under the tongue every 5 (five) minutes as needed for chest pain.   nystatin (MYCOSTATIN) 100000  UNIT/ML suspension Take 5 mLs (500,000 Units total) by mouth 4 (four) times daily.   OneTouch Delica Lancets 33G MISC Use 1x a day   OXYGEN Inhale into the lungs. 2lpm   rosuvastatin (CRESTOR) 10 MG tablet Take 1 tablet (10 mg total) by mouth every other day.   Spacer/Aero Chamber [employer] Use with  inhaler as needed.  J44.9   torsemide (DEMADEX) 20 MG tablet Take 20 mg by mouth daily.   Zinc Oxide (TRIPLE PASTE) 12.8 % ointment Apply 1 Application topically as needed for irritation. Every shift.   [DISCONTINUED] amiodarone (PACERONE) 200 MG tablet Take 1 tablet (200 mg total) by mouth 2 (two) times daily for 7 days, THEN 1 tablet (200 mg total) daily. (Patient taking differently: Take 1 tablet (200 mg total) daily.)   [DISCONTINUED] busPIRone (BUSPAR) 15 MG tablet Take 1 tablet (15 mg total) by mouth 2 (two) times daily. With extra 1/2 tab midday (Patient taking differently: Take 15 mg by mouth 3 (three) times daily.)   [DISCONTINUED] fexofenadine (ALLEGRA) 180 MG tablet Take 180 mg by mouth as needed.   [DISCONTINUED] fluticasone (FLONASE) 50 MCG/ACT nasal spray USE 2 SPRAYS INTO THE NOSE EVERY 12 HOURS AS NEEDED FOR STUFFY NOSE   [DISCONTINUED] torsemide (DEMADEX) 20 MG tablet Take 1 tablet (20 mg total) by mouth every other day. Check renal functions and volume status to titrate the dose accordingly. (Patient taking differently: Take 20 mg by mouth daily. Check renal functions and volume status to titrate the dose accordingly.)   No facility-administered encounter medications on file as of 07/26/2022.    Review of Systems  Immunization History  Administered Date(s) Administered   DT (Pediatric) 12/23/2010   Fluad Quad(high Dose 65+) 12/28/2018, 12/05/2019, 03/16/2022   H1N1 05/30/2008   Influenza Split 12/23/2010, 12/16/2011, 01/08/2013   Influenza Whole 12/07/1995, 12/07/2005, 01/04/2008, 01/06/2010   Influenza, High Dose Seasonal PF 01/06/2016, 01/06/2017, 01/07/2021  Influenza,inj,Quad PF,6+ Mos 12/26/2013, 12/05/2014, 11/28/2017   Influenza-Unspecified 03/16/2022   PFIZER(Purple Top)SARS-COV-2 Vaccination 03/27/2019, 04/18/2019, 01/18/2020   PPD Test 10/02/2014   Pneumococcal Conjugate-13 08/17/2013   Pneumococcal Polysaccharide-23 12/06/2000, 03/08/2005, 10/24/2008, 12/28/2018   Td 03/08/1993, 10/04/2000, 12/23/2010   Tdap 10/01/2016, 04/30/2021   Zoster, Live 12/07/2005   Pertinent  Health Maintenance Due  Topic Date Due   OPHTHALMOLOGY EXAM  06/24/2022   HEMOGLOBIN A1C  09/14/2022   INFLUENZA VACCINE  10/07/2022   FOOT EXAM  10/17/2022   DEXA SCAN  Completed      02/10/2022    2:30 PM 02/17/2022    1:54 PM 03/04/2022    3:31 PM 03/10/2022    3:00 PM 03/10/2022    4:06 PM  Fall Risk  (RETIRED) Patient Fall Risk Level High fall risk High fall risk Moderate fall risk High fall risk High fall risk   Functional Status Survey:    Vitals:   07/26/22 1354 07/26/22 1356  BP: (!) 144/64 124/65  Pulse: (!) 54   Resp: (!) 22   Temp: (!) 97.5 F (36.4 C)   SpO2: 92%   Weight: 236 lb 3.2 oz (107.1 kg)   Height: 5\' 5"  (1.651 m)    Body mass index is 39.31 kg/m. Physical Exam Constitutional:      Appearance: She is obese.  Cardiovascular:     Rate and Rhythm: Normal rate.  Pulmonary:     Effort: Pulmonary effort is normal. No respiratory distress.     Breath sounds: Wheezing present.  Abdominal:     Palpations: Abdomen is soft.  Musculoskeletal:        General: Swelling present.  Neurological:     Mental Status: She is alert. Mental status is at baseline.     Labs reviewed: Recent Labs    07/03/22 0629 07/04/22 0553 07/05/22 0632 07/06/22 1048 07/07/22 0718 07/08/22 0000  NA 141   < > 143 143 144 143  K 4.1   < > 3.9 3.9 3.9 3.9  CL 102   < > 101 97* 100 101  CO2 32   < > 34* 34* 36* 39*  GLUCOSE 105*   < > 107* 182* 104*  --   BUN 40*   < > 41* 41* 46* 40*  CREATININE 1.11*   < > 1.18* 1.22* 1.25* 1.2*  CALCIUM 8.6*    < > 8.7* 8.7* 8.6* 8.7  MG 2.2  --   --  1.9 2.0  --   PHOS 3.3  --   --  3.4 3.4  --    < > = values in this interval not displayed.   Recent Labs    03/10/22 1446 06/29/22 1302 06/29/22 1437 07/08/22 0000  AST 17 16 16 14   ALT 12 15 16 22   ALKPHOS 84 104 100 78  BILITOT 0.4 0.5 0.8  --   PROT 7.2 6.5 6.3*  --   ALBUMIN 3.7 2.9* 2.9* 3.2*   Recent Labs    06/29/22 1437 06/30/22 0631 07/01/22 0611 07/05/22 0632 07/06/22 1048 07/07/22 0718 07/22/22 0000  WBC 12.3* 8.3   < > 6.6 11.6* 7.6 5.7  NEUTROABS 11.0* 8.1*  --   --   --   --  4,161.00  HGB 8.1* 8.3*   < > 9.0* 9.3* 9.1* 8.9*  HCT 28.4* 29.8*   < > 30.3* 32.5* 30.6* 29*  MCV 101.1* 103.8*   < > 93.2 95.3 93.9  --   PLT 136*  131*   < > 278 343 324 117*   < > = values in this interval not displayed.   Lab Results  Component Value Date   TSH 0.899 06/30/2022   Lab Results  Component Value Date   HGBA1C 5.3 03/16/2022   Lab Results  Component Value Date   CHOL 140 08/06/2021   HDL 57.80 08/06/2021   LDLCALC 62 08/06/2021   LDLDIRECT 63.0 07/29/2015   TRIG 99.0 08/06/2021   CHOLHDL 2 08/06/2021    Significant Diagnostic Results in last 30 days:  DG Chest Port 1 View  Result Date: 07/05/2022 CLINICAL DATA:  Dyspnea on exertion EXAM: PORTABLE CHEST 1 VIEW COMPARISON:  06/29/2022 FINDINGS: Enlarged cardiopericardial silhouette with vascular congestion. Tiny pleural effusions. No pneumothorax. Calcified aorta. Overlapping cardiac leads. Film is under penetrated IMPRESSION: Enlarged cardiopericardial silhouette with vascular congestion. Tiny effusions. Calcified aorta Electronically Signed   By: Karen Kays M.D.   On: 07/05/2022 17:58   ECHOCARDIOGRAM COMPLETE  Result Date: 07/01/2022    ECHOCARDIOGRAM REPORT   Patient Name:   Alexandria [name] Date of Exam: 06/30/2022 Medical Rec #:  [id number]       Height:       65.0 in Accession #:    [id number]      Weight:       230.6 lb Date of Birth:  [birth year]       BSA:           2.102 m Patient Age:    86 years        BP:           118/55 mmHg Patient Gender: F               HR:           72 bpm. Exam Location:  [facility] Procedure: 2D Echo, Cardiac Doppler, Color Doppler and Intracardiac            Opacification Agent Indications:     I48.91 Atrial Fibrillation  History:         Patient has prior history of Echocardiogram examinations, most                  recent 03/29/2022. COPD; Risk Factors:Hypertension, Diabetes and                  Dyslipidemia. Obstructive sleep apnea. Hypothyroidism.  Sonographer:     Daphine Deutscher RDCS Referring Phys:  [id number] Lanney Gins FOUST Diagnosing Phys: Julien Nordmann MD IMPRESSIONS  1. Left ventricular ejection fraction, by estimation, is 60 to 65%. The left ventricle has normal function. The left ventricle has no regional wall motion abnormalities. Left ventricular diastolic parameters are indeterminate.  2. Right ventricular systolic function is normal. The right ventricular size is normal.  3. The mitral valve is normal in structure. Mild mitral valve regurgitation. No evidence of mitral stenosis. Moderate mitral annular calcification.  4. The aortic valve is normal in structure. Aortic valve regurgitation is not visualized. Mild aortic valve stenosis. Aortic valve area, by VTI measures 1.64 cm. Aortic valve mean gradient measures 15.0 mmHg. Aortic valve Vmax measures 2.32 m/s.  5. The inferior vena cava is normal in size with greater than 50% respiratory variability, suggesting right atrial pressure of 3 mmHg. FINDINGS  Left Ventricle: Left ventricular ejection fraction, by estimation, is 60 to 65%. The left ventricle has normal function. The left ventricle has no regional wall motion abnormalities. Definity contrast agent was given IV to delineate  the left ventricular  endocardial borders. The left ventricular internal cavity size was normal in size. There is no left ventricular hypertrophy. Left ventricular diastolic parameters are  indeterminate. Right Ventricle: The right ventricular size is normal. No increase in right ventricular wall thickness. Right ventricular systolic function is normal. Left Atrium: Left atrial size was normal in size. Right Atrium: Right atrial size was normal in size. Pericardium: There is no evidence of pericardial effusion. Mitral Valve: The mitral valve is normal in structure. Moderate mitral annular calcification. Mild mitral valve regurgitation. No evidence of mitral valve stenosis. Tricuspid Valve: The tricuspid valve is normal in structure. Tricuspid valve regurgitation is not demonstrated. No evidence of tricuspid stenosis. Aortic Valve: The aortic valve is normal in structure. Aortic valve regurgitation is not visualized. Mild aortic stenosis is present. Aortic valve mean gradient measures 15.0 mmHg. Aortic valve peak gradient measures 21.5 mmHg. Aortic valve area, by VTI measures 1.64 cm. Pulmonic Valve: The pulmonic valve was normal in structure. Pulmonic valve regurgitation is not visualized. No evidence of pulmonic stenosis. Aorta: The aortic root is normal in size and structure. Venous: The inferior vena cava is normal in size with greater than 50% respiratory variability, suggesting right atrial pressure of 3 mmHg. IAS/Shunts: No atrial level shunt detected by color flow Doppler.  LEFT VENTRICLE PLAX 2D LVIDd:         4.90 cm   Diastology LVIDs:         3.30 cm   LV e' medial:    9.03 cm/s LV PW:         1.00 cm   LV E/e' medial:  16.8 LV IVS:        1.00 cm   LV e' lateral:   11.60 cm/s LVOT diam:     1.80 cm   LV E/e' lateral: 13.1 LV SV:         75 LV SV Index:   36 LVOT Area:     2.54 cm  RIGHT VENTRICLE             IVC RV Basal diam:  3.70 cm     IVC diam: 1.70 cm RV S prime:     12.15 cm/s TAPSE (M-mode): 2.1 cm LEFT ATRIUM             Index        RIGHT ATRIUM           Index LA diam:        5.10 cm 2.43 cm/m   RA Area:     13.00 cm LA Vol (A2C):   93.2 ml 44.35 ml/m  RA Volume:   29.60 ml   14.09 ml/m LA Vol (A4C):   68.3 ml 32.50 ml/m LA Biplane Vol: 80.8 ml 38.45 ml/m  AORTIC VALVE AV Area (Vmax):    1.61 cm AV Area (Vmean):   1.49 cm AV Area (VTI):     1.64 cm AV Vmax:           231.75 cm/s AV Vmean:          166.250 cm/s AV VTI:            0.458 m AV Peak Grad:      21.5 mmHg AV Mean Grad:      15.0 mmHg LVOT Vmax:         147.00 cm/s LVOT Vmean:        97.100 cm/s LVOT VTI:  0.296 m LVOT/AV VTI ratio: 0.65  AORTA Ao Root diam: 3.00 cm MITRAL VALVE MV Area (PHT): 3.29 cm     SHUNTS MV Decel Time: 231 msec     Systemic VTI:  0.30 m MV E velocity: 152.00 cm/s  Systemic Diam: 1.80 cm MV A velocity: 120.75 cm/s MV E/A ratio:  1.26 Julien Nordmann MD Electronically signed by Julien Nordmann MD Signature Date/Time: 07/01/2022/7:52:14 AM    Final    CT Head Wo Contrast  Result Date: 06/29/2022 CLINICAL DATA:  Mental status change, unknown cause EXAM: CT HEAD WITHOUT CONTRAST TECHNIQUE: Contiguous axial images were obtained from the base of the skull through the vertex without intravenous contrast. RADIATION DOSE REDUCTION: This exam was performed according to the departmental dose-optimization program which includes automated exposure control, adjustment of the mA and/or kV according to patient size and/or use of iterative reconstruction technique. COMPARISON:  CT head April 30, 2021. FINDINGS: Brain: No evidence of acute large vascular territory infarction, hemorrhage, hydrocephalus, extra-axial collection or mass lesion/mass effect. Patchy white matter hypodensities, nonspecific but compatible with chronic microvascular ischemic disease. Vascular: No hyperdense vessel. Skull: No acute fracture. Sinuses/Orbits: Mild paranasal sinus mucosal thickening. No acute orbital findings. Other: No mastoid effusions. IMPRESSION: No evidence of acute intracranial abnormality. Electronically Signed   By: Feliberto Harts M.D.   On: 06/29/2022 15:35   DG Chest Port 1 View  Result Date:  06/29/2022 CLINICAL DATA:  Sepsis EXAM: PORTABLE CHEST 1 VIEW COMPARISON:  X-ray 02/24/2022 and older.  CT scan 03/19/2022 FINDINGS: Enlarged cardiopericardial silhouette with some vascular congestion. Calcified aorta. No consolidation, pneumothorax or effusion. No edema. Films are under penetrated IMPRESSION: Enlarged cardiopericardial silhouette with vascular congestion. Calcified and tortuous aorta Electronically Signed   By: Karen Kays M.D.   On: 06/29/2022 15:06    Assessment/Plan Chronic diastolic CHF (congestive heart failure) (HCC) Signifcant weight gain since admission. Increase torsemide to 20 mg daily. Metolazone 2.5 mg 30 min before torsemide for 2 days. BMP on Thursday. Patient will discharge once we have gotten her to her baseline weight and family has a safe discharge plan in place.    Family/ staff Communication: nursing, daughter [name].   Labs/tests ordered:  BMP   I spent greater than 30 minutes for the care of this patient in face to face time, chart review, clinical documentation, patient education with 50% of the time in discussion with patient's daughter and HCPOA over the phone.

## 2022-07-27 ENCOUNTER — Non-Acute Institutional Stay (SKILLED_NURSING_FACILITY): Payer: Medicare Other | Admitting: Nurse Practitioner

## 2022-07-27 ENCOUNTER — Encounter: Payer: Self-pay | Admitting: Nurse Practitioner

## 2022-07-27 DIAGNOSIS — J439 Emphysema, unspecified: Secondary | ICD-10-CM | POA: Diagnosis not present

## 2022-07-27 DIAGNOSIS — R062 Wheezing: Secondary | ICD-10-CM

## 2022-07-27 DIAGNOSIS — E1140 Type 2 diabetes mellitus with diabetic neuropathy, unspecified: Secondary | ICD-10-CM | POA: Diagnosis not present

## 2022-07-27 DIAGNOSIS — B9620 Unspecified Escherichia coli [E. coli] as the cause of diseases classified elsewhere: Secondary | ICD-10-CM | POA: Diagnosis not present

## 2022-07-27 DIAGNOSIS — D509 Iron deficiency anemia, unspecified: Secondary | ICD-10-CM

## 2022-07-27 DIAGNOSIS — J4489 Other specified chronic obstructive pulmonary disease: Secondary | ICD-10-CM

## 2022-07-27 DIAGNOSIS — I480 Paroxysmal atrial fibrillation: Secondary | ICD-10-CM | POA: Diagnosis not present

## 2022-07-27 DIAGNOSIS — I10 Essential (primary) hypertension: Secondary | ICD-10-CM

## 2022-07-27 DIAGNOSIS — I5032 Chronic diastolic (congestive) heart failure: Secondary | ICD-10-CM | POA: Diagnosis not present

## 2022-07-27 DIAGNOSIS — E039 Hypothyroidism, unspecified: Secondary | ICD-10-CM

## 2022-07-27 DIAGNOSIS — R7881 Bacteremia: Secondary | ICD-10-CM

## 2022-07-27 NOTE — Progress Notes (Unsigned)
Location:  Other [facility].  Nursing Home Room Number: [facility] [address] Place of Service:  SNF [phone]) Abbey Chatters, NP  PCP: Joaquim Nam, MD  Patient Care Team: Joaquim Nam, MD as PCP - General Mariah Milling Tollie Pizza, MD as PCP - Cardiology (Cardiology) Mariah Milling Tollie Pizza, MD as Consulting Physician (Cardiology) Oneta Rack, NP as Nurse Practitioner (Adult Health Nurse Practitioner) Earna Coder, MD as Consulting Physician (Oncology)  Extended Emergency Contact Information Primary Emergency Contact: Garrison Columbus Address: [address] [place], [place] [address] [place] Home Phone: [phone] Mobile Phone: [phone] Relation: Daughter Secondary Emergency Contact: [facility] Phone: [phone] Relation: Relative  Goals of care: Advanced Directive information    07/27/2022   11:55 AM  Advanced Directives  Does Patient Have a Medical Advance Directive? Yes  Type of Advance Directive Healthcare Power of Attorney  Does patient want to make changes to medical advance directive? No - Patient declined  Copy of Healthcare Power of Attorney in Chart? Yes - validated most recent copy scanned in chart (See row information)     Chief Complaint  Patient presents with   Discharge Note    Discharge.     HPI:  Pt is a 87 y.o. female seen today for Discharge home.  Pt with hx of COPD with chronic respiratory failure on chronic O2, iron def anemia, CHF, hypothyroid and more. She went to ED for AMS and found to have COPD exacerbation with metabolic encephalopathy. She was treated for a fib and UTI as well.  She came to [facility] for rehab for strength training.  Noted to have increase in weight gain and swelling to lower legs her torsemide was increased to 20 mg daily and metolazone was added for 2 days.  Staff noted 10 lbs weight loss over night and pt reports improvement in her LE edema already.  She is wearing compression hose  and elevating legs. No worsening shortness of breath.  Mentation is back to baseline at this time and she is ready to go home with home health and family support.    Past Medical History:  Diagnosis Date   Allergy, unspecified not elsewhere classified    Anxiety    Aortic stenosis, mild 03/30/2022   a. TTE 03/29/2022: EF 55-60, no RWMA, GR 1 DD, normal RVSF, mild aortic stenosis (mean 10, V-max 212 cm/s, DI 0.70)   Cataract    Chronic diastolic congestive heart failure (HCC)    COPD (chronic obstructive pulmonary disease) (HCC) 03/08/1998   PFTs 12/12/2002 FEV 1 1.42 (64%) ratio 58 with no better after B2 and DLCO75%; PFTs 11/20/09 FEV1 1.50 (73%) ratio 50 no better after B2 with DLCO 62%; Hfa 50% 11/20/2009 >75%, 01/13/10 p coaching   Coronary artery calcification seen on CT scan    a. 2012 Neg stress test.   Depression 12/07/1998   Diabetes mellitus type II 02/05/2006   Dr. Elvera Lennox with endo   Disorders of bursae and tendons in shoulder region, unspecified    Rotator cuff syndrome, right   E. coli bacteremia    Esophagitis    GERD (gastroesophageal reflux disease)    History of UTI    Hyperlipidemia 10/04/2000   Hypertension 03/08/1992   Hypothyroidism 03/08/1968   Iron deficiency anemia    Lung nodule    radiation starts 10-08-2019   Malignant neoplasm of right upper lobe of lung (HCC) 07/24/2019   Obesity  NOS   OSA (obstructive sleep apnea)    PSG 01/27/10 AHI 13, pt does not know CPAP settings   Peripheral neuropathy    Likely due to DM per Dr. Tresa Endo hernia    Past Surgical History:  Procedure Laterality Date   ABD U/S  03/19/1999   Nml x2 foci in liver   ADENOSINE MYOVIEW  06/02/2007   Nml   CARDIOLITE PERSANTINE  08/24/2000   Nml   CAROTID U/S  08/24/2000   1-39% ICA stenosis   CAROTID U/S  06/02/2007   No apprec change    CARPAL TUNNEL RELEASE  12/1997   Right   CESAREAN SECTION     x2 [place]/ repeat   CHOLECYSTECTOMY  1997   COLONOSCOPY WITH  PROPOFOL N/A 02/12/2016   Procedure: COLONOSCOPY WITH PROPOFOL;  Surgeon: Rachael Fee, MD;  Location: [facility];  Service: Endoscopy;  Laterality: N/A;   CT ABD W & PELVIS WO/W CM     Abd hemangiomas of liver, 1 cm R renal cyst   DENTAL SURGERY  2016   Implants   DEXA  07/03/2003   Nml   ESOPHAGOGASTRODUODENOSCOPY  12/05/1997   Nml (due to hoarseness)   ESOPHAGOGASTRODUODENOSCOPY (EGD) WITH PROPOFOL N/A 02/12/2016   Procedure: ESOPHAGOGASTRODUODENOSCOPY (EGD) WITH PROPOFOL;  Surgeon: Rachael Fee, MD;  Location: [facility];  Service: Endoscopy;  Laterality: N/A;   GALLBLADDER SURGERY     HERNIA REPAIR  01/24/2009   Lap Ventr w/ Lysis of adhesions (Dr. Dwain Sarna)   knee arthroscopic surgery  years ago   right   ROTATOR CUFF REPAIR  1984   Right, [place]   SHOULDER OPEN ROTATOR CUFF REPAIR  02/08/2012   Procedure: ROTATOR CUFF REPAIR SHOULDER OPEN;  Surgeon: Drucilla Schmidt, MD;  Location: [facility];  Service: Orthopedics;  Laterality: Left;  Left Shoulder Open Anterior Acrominectomy Rotator Cuff Repair Open Distal Clavicle Resection ,tissue mend graft, and repair of biceps tendon   THUMB RELEASE  12/1997   Right   TONSILLECTOMY     TOTAL ABDOMINAL HYSTERECTOMY  1985   Due to dysmennorhea   US ECHOCARDIOGRAPHY  06/02/2007    Allergies  Allergen Reactions   Antihistamines, Diphenhydramine-Type Other (See Comments)    Able to tolerate only allegra.    Sulfonamide Derivatives Swelling    REACTION: closed throat   Incruse Ellipta [Umeclidinium Bromide] Cough    Caused voice change and severe coughing   Clarithromycin Other (See Comments)    REACTION: diff swallowing and mouth blisters   Codeine Other (See Comments)    Unknown    Lyrica [Pregabalin] Other (See Comments)    Lack of effect for neuropathy pain.     Spiriva Handihaler [Tiotropium Bromide Monohydrate] Other (See Comments)    Voice changes   Vraylar [Cariprazine]     intolerant    Outpatient Encounter  Medications as of 07/27/2022  Medication Sig   acetaminophen (TYLENOL) 325 MG tablet Take 2 tablets (650 mg total) by mouth every 6 (six) hours as needed for mild pain, headache, fever or moderate pain.   albuterol (PROAIR HFA) 108 (90 Base) MCG/ACT inhaler Inhale 2 puffs into the lungs every 6 (six) hours as needed for wheezing or shortness of breath. INHALE 2 PUFFS INTO THE LUNGS EVERY 6 HOURS AS NEEDED FOR WHEEZING OR SHORTNESS OF BREATH   amiodarone (PACERONE) 200 MG tablet Take 200 mg by mouth daily.   arformoterol (BROVANA) 15 MCG/2ML NEBU Take 2 mLs (15 mcg total)  by nebulization at bedtime.   bisoprolol (ZEBETA) 5 MG tablet Take 1 tablet (5 mg total) by mouth daily.   budesonide (PULMICORT) 0.5 MG/2ML nebulizer solution Take 2 mLs (0.5 mg total) by nebulization in the morning and at bedtime.   busPIRone (BUSPAR) 15 MG tablet Take 15 mg by mouth 3 (three) times daily.   Cholecalciferol (VITAMIN D) 50 MCG (2000 UT) tablet Take 1 tablet (2,000 Units total) by mouth daily.   Coenzyme Q-10 200 MG CAPS Take 200 mg by mouth daily.   Cyanocobalamin (B-12) 5000 MCG CAPS Take 5,000 mcg by mouth daily.   esomeprazole (NEXIUM) 40 MG capsule Take 1 capsule (40 mg total) by mouth daily at 12 noon.   ezetimibe (ZETIA) 10 MG tablet TAKE 1 TABLET BY MOUTH EVERYDAY AT BEDTIME   ferrous sulfate 325 (65 FE) MG tablet TAKE 1 TABLET BY MOUTH EVERY DAY   FLUoxetine (PROZAC) 20 MG tablet Take 20 mg by mouth 2 (two) times daily.   glucose blood (ONETOUCH VERIO) test strip USE AS INSTRUCTED TO CHECK SUGAR 1 TIME DAILY   guaiFENesin (MUCINEX) 600 MG 12 hr tablet Take 2 tablets (1,200 mg total) by mouth 2 (two) times daily as needed for cough or to loosen phlegm.   levothyroxine (SYNTHROID) 125 MCG tablet Take 1 tablet (125 mcg total) by mouth daily.   magnesium 30 MG tablet Take 30 mg by mouth 2 (two) times daily.   melatonin 5 MG TABS Take 1 tablet (5 mg total) by mouth at bedtime.   metFORMIN (GLUCOPHAGE) 500 MG  tablet Take 1 tablet (500 mg total) by mouth every evening.   metolazone (ZAROXOLYN) 2.5 MG tablet Take 2.5 mg by mouth daily.   nitroGLYCERIN (NITROSTAT) 0.4 MG SL tablet Place 1 tablet (0.4 mg total) under the tongue every 5 (five) minutes as needed for chest pain.   nystatin (MYCOSTATIN) 100000 UNIT/ML suspension Take 5 mLs (500,000 Units total) by mouth 4 (four) times daily.   OneTouch Delica Lancets 33G MISC Use 1x a day   OXYGEN Inhale into the lungs. 2lpm   rosuvastatin (CRESTOR) 10 MG tablet Take 1 tablet (10 mg total) by mouth every other day.   Spacer/Aero Chamber [employer] Use with  inhaler as needed.  J44.9   torsemide (DEMADEX) 20 MG tablet Take 20 mg by mouth daily.   Zinc Oxide (TRIPLE PASTE) 12.8 % ointment Apply 1 Application topically as needed for irritation. Every shift.   No facility-administered encounter medications on file as of 07/27/2022.    Review of Systems  Constitutional:  Negative for activity change, appetite change, fatigue and unexpected weight change.  HENT:  Negative for congestion and hearing loss.   Eyes: Negative.   Respiratory:  Negative for cough and shortness of breath.   Cardiovascular:  Positive for leg swelling. Negative for chest pain and palpitations.  Gastrointestinal:  Negative for abdominal pain, constipation and diarrhea.  Genitourinary:  Negative for difficulty urinating and dysuria.  Musculoskeletal:  Negative for arthralgias and myalgias.  Skin:  Negative for color change and wound.  Neurological:  Negative for dizziness and weakness.  Psychiatric/Behavioral:  Negative for agitation, behavioral problems and confusion.    ***  Immunization History  Administered Date(s) Administered   DT (Pediatric) 12/23/2010   Fluad Quad(high Dose 65+) 12/28/2018, 12/05/2019, 03/16/2022   H1N1 05/30/2008   Influenza Split 12/23/2010, 12/16/2011, 01/08/2013   Influenza Whole 12/07/1995, 12/07/2005, 01/04/2008, 01/06/2010   Influenza, High  Dose Seasonal PF 01/06/2016, 01/06/2017, 01/07/2021  Influenza,inj,Quad PF,6+ Mos 12/26/2013, 12/05/2014, 11/28/2017   Influenza-Unspecified 03/16/2022   PFIZER(Purple Top)SARS-COV-2 Vaccination 03/27/2019, 04/18/2019, 01/18/2020   PPD Test 10/02/2014   Pneumococcal Conjugate-13 08/17/2013   Pneumococcal Polysaccharide-23 12/06/2000, 03/08/2005, 10/24/2008, 12/28/2018   Td 03/08/1993, 10/04/2000, 12/23/2010   Tdap 10/01/2016, 04/30/2021   Zoster, Live 12/07/2005   Pertinent  Health Maintenance Due  Topic Date Due   OPHTHALMOLOGY EXAM  06/24/2022   HEMOGLOBIN A1C  09/14/2022   INFLUENZA VACCINE  10/07/2022   FOOT EXAM  10/17/2022   DEXA SCAN  Completed      02/10/2022    2:30 PM 02/17/2022    1:54 PM 03/04/2022    3:31 PM 03/10/2022    3:00 PM 03/10/2022    4:06 PM  Fall Risk  (RETIRED) Patient Fall Risk Level High fall risk High fall risk Moderate fall risk High fall risk High fall risk   Functional Status Survey:    Vitals:   07/27/22 1149  BP: (!) 122/48  Pulse: 80  Resp: (!) 22  Temp: 97.7 F (36.5 C)  SpO2: 93%  Weight: 226 lb 12.8 oz (102.9 kg)  Height: 5\' 5"  (1.651 m)   Body mass index is 37.74 kg/m. Physical Exam Constitutional:      General: She is not in acute distress.    Appearance: She is well-developed. She is not diaphoretic.  HENT:     Head: Normocephalic and atraumatic.     Mouth/Throat:     Pharynx: No oropharyngeal exudate.  Eyes:     Conjunctiva/sclera: Conjunctivae normal.     Pupils: Pupils are equal, round, and reactive to light.  Cardiovascular:     Rate and Rhythm: Normal rate and regular rhythm.     Heart sounds: Normal heart sounds.  Pulmonary:     Effort: Pulmonary effort is normal.     Breath sounds: Normal breath sounds.  Abdominal:     General: Bowel sounds are normal.     Palpations: Abdomen is soft.  Musculoskeletal:     Cervical back: Normal range of motion and neck supple.     Right lower leg: Edema present.     Left  lower leg: Edema present.  Skin:    General: Skin is warm and dry.  Neurological:     Mental Status: She is alert.  Psychiatric:        Mood and Affect: Mood normal.   ***  Labs reviewed: Recent Labs    07/03/22 0629 07/04/22 0553 07/05/22 0632 07/06/22 1048 07/07/22 0718 07/08/22 0000  NA 141   < > 143 143 144 143  K 4.1   < > 3.9 3.9 3.9 3.9  CL 102   < > 101 97* 100 101  CO2 32   < > 34* 34* 36* 39*  GLUCOSE 105*   < > 107* 182* 104*  --   BUN 40*   < > 41* 41* 46* 40*  CREATININE 1.11*   < > 1.18* 1.22* 1.25* 1.2*  CALCIUM 8.6*   < > 8.7* 8.7* 8.6* 8.7  MG 2.2  --   --  1.9 2.0  --   PHOS 3.3  --   --  3.4 3.4  --    < > = values in this interval not displayed.   Recent Labs    03/10/22 1446 06/29/22 1302 06/29/22 1437 07/08/22 0000  AST 17 16 16 14   ALT 12 15 16 22   ALKPHOS 84 104 100 78  BILITOT 0.4 0.5 0.8  --  PROT 7.2 6.5 6.3*  --   ALBUMIN 3.7 2.9* 2.9* 3.2*   Recent Labs    06/29/22 1437 06/30/22 0631 07/01/22 0611 07/05/22 0632 07/06/22 1048 07/07/22 0718 07/22/22 0000  WBC 12.3* 8.3   < > 6.6 11.6* 7.6 5.7  NEUTROABS 11.0* 8.1*  --   --   --   --  4,161.00  HGB 8.1* 8.3*   < > 9.0* 9.3* 9.1* 8.9*  HCT 28.4* 29.8*   < > 30.3* 32.5* 30.6* 29*  MCV 101.1* 103.8*   < > 93.2 95.3 93.9  --   PLT 136* 131*   < > 278 343 324 117*   < > = values in this interval not displayed.   Lab Results  Component Value Date   TSH 0.899 06/30/2022   Lab Results  Component Value Date   HGBA1C 5.3 03/16/2022   Lab Results  Component Value Date   CHOL 140 08/06/2021   HDL 57.80 08/06/2021   LDLCALC 62 08/06/2021   LDLDIRECT 63.0 07/29/2015   TRIG 99.0 08/06/2021   CHOLHDL 2 08/06/2021    Significant Diagnostic Results in last 30 days:  DG Chest Port 1 View  Result Date: 07/05/2022 CLINICAL DATA:  Dyspnea on exertion EXAM: PORTABLE CHEST 1 VIEW COMPARISON:  06/29/2022 FINDINGS: Enlarged cardiopericardial silhouette with vascular congestion. Tiny  pleural effusions. No pneumothorax. Calcified aorta. Overlapping cardiac leads. Film is under penetrated IMPRESSION: Enlarged cardiopericardial silhouette with vascular congestion. Tiny effusions. Calcified aorta Electronically Signed   By: Karen Kays M.D.   On: 07/05/2022 17:58   ECHOCARDIOGRAM COMPLETE  Result Date: 07/01/2022    ECHOCARDIOGRAM REPORT   Patient Name:   JACQUESE YARWOOD Date of Exam: 06/30/2022 Medical Rec #:  [id number]       Height:       65.0 in Accession #:    [id number]      Weight:       230.6 lb Date of Birth:  1935-11-13       BSA:          2.102 m Patient Age:    86 years        BP:           118/55 mmHg Patient Gender: F               HR:           72 bpm. Exam Location:  [facility] Procedure: 2D Echo, Cardiac Doppler, Color Doppler and Intracardiac            Opacification Agent Indications:     I48.91 Atrial Fibrillation  History:         Patient has prior history of Echocardiogram examinations, most                  recent 03/29/2022. COPD; Risk Factors:Hypertension, Diabetes and                  Dyslipidemia. Obstructive sleep apnea. Hypothyroidism.  Sonographer:     Daphine Deutscher RDCS Referring Phys:  [id number] Lanney Gins FOUST Diagnosing Phys: Julien Nordmann MD IMPRESSIONS  1. Left ventricular ejection fraction, by estimation, is 60 to 65%. The left ventricle has normal function. The left ventricle has no regional wall motion abnormalities. Left ventricular diastolic parameters are indeterminate.  2. Right ventricular systolic function is normal. The right ventricular size is normal.  3. The mitral valve is normal in structure. Mild mitral valve regurgitation. No evidence of mitral stenosis.  Moderate mitral annular calcification.  4. The aortic valve is normal in structure. Aortic valve regurgitation is not visualized. Mild aortic valve stenosis. Aortic valve area, by VTI measures 1.64 cm. Aortic valve mean gradient measures 15.0 mmHg. Aortic valve Vmax measures 2.32 m/s.  5. The  inferior vena cava is normal in size with greater than 50% respiratory variability, suggesting right atrial pressure of 3 mmHg. FINDINGS  Left Ventricle: Left ventricular ejection fraction, by estimation, is 60 to 65%. The left ventricle has normal function. The left ventricle has no regional wall motion abnormalities. Definity contrast agent was given IV to delineate the left ventricular  endocardial borders. The left ventricular internal cavity size was normal in size. There is no left ventricular hypertrophy. Left ventricular diastolic parameters are indeterminate. Right Ventricle: The right ventricular size is normal. No increase in right ventricular wall thickness. Right ventricular systolic function is normal. Left Atrium: Left atrial size was normal in size. Right Atrium: Right atrial size was normal in size. Pericardium: There is no evidence of pericardial effusion. Mitral Valve: The mitral valve is normal in structure. Moderate mitral annular calcification. Mild mitral valve regurgitation. No evidence of mitral valve stenosis. Tricuspid Valve: The tricuspid valve is normal in structure. Tricuspid valve regurgitation is not demonstrated. No evidence of tricuspid stenosis. Aortic Valve: The aortic valve is normal in structure. Aortic valve regurgitation is not visualized. Mild aortic stenosis is present. Aortic valve mean gradient measures 15.0 mmHg. Aortic valve peak gradient measures 21.5 mmHg. Aortic valve area, by VTI measures 1.64 cm. Pulmonic Valve: The pulmonic valve was normal in structure. Pulmonic valve regurgitation is not visualized. No evidence of pulmonic stenosis. Aorta: The aortic root is normal in size and structure. Venous: The inferior vena cava is normal in size with greater than 50% respiratory variability, suggesting right atrial pressure of 3 mmHg. IAS/Shunts: No atrial level shunt detected by color flow Doppler.  LEFT VENTRICLE PLAX 2D LVIDd:         4.90 cm   Diastology LVIDs:          3.30 cm   LV e' medial:    9.03 cm/s LV PW:         1.00 cm   LV E/e' medial:  16.8 LV IVS:        1.00 cm   LV e' lateral:   11.60 cm/s LVOT diam:     1.80 cm   LV E/e' lateral: 13.1 LV SV:         75 LV SV Index:   36 LVOT Area:     2.54 cm  RIGHT VENTRICLE             IVC RV Basal diam:  3.70 cm     IVC diam: 1.70 cm RV S prime:     12.15 cm/s TAPSE (M-mode): 2.1 cm LEFT ATRIUM             Index        RIGHT ATRIUM           Index LA diam:        5.10 cm 2.43 cm/m   RA Area:     13.00 cm LA Vol (A2C):   93.2 ml 44.35 ml/m  RA Volume:   29.60 ml  14.09 ml/m LA Vol (A4C):   68.3 ml 32.50 ml/m LA Biplane Vol: 80.8 ml 38.45 ml/m  AORTIC VALVE AV Area (Vmax):    1.61 cm AV Area (Vmean):   1.49 cm  AV Area (VTI):     1.64 cm AV Vmax:           231.75 cm/s AV Vmean:          166.250 cm/s AV VTI:            0.458 m AV Peak Grad:      21.5 mmHg AV Mean Grad:      15.0 mmHg LVOT Vmax:         147.00 cm/s LVOT Vmean:        97.100 cm/s LVOT VTI:          0.296 m LVOT/AV VTI ratio: 0.65  AORTA Ao Root diam: 3.00 cm MITRAL VALVE MV Area (PHT): 3.29 cm     SHUNTS MV Decel Time: 231 msec     Systemic VTI:  0.30 m MV E velocity: 152.00 cm/s  Systemic Diam: 1.80 cm MV A velocity: 120.75 cm/s MV E/A ratio:  1.26 Julien Nordmann MD Electronically signed by Julien Nordmann MD Signature Date/Time: 07/01/2022/7:52:14 AM    Final    CT Head Wo Contrast  Result Date: 06/29/2022 CLINICAL DATA:  Mental status change, unknown cause EXAM: CT HEAD WITHOUT CONTRAST TECHNIQUE: Contiguous axial images were obtained from the base of the skull through the vertex without intravenous contrast. RADIATION DOSE REDUCTION: This exam was performed according to the departmental dose-optimization program which includes automated exposure control, adjustment of the mA and/or kV according to patient size and/or use of iterative reconstruction technique. COMPARISON:  CT head April 30, 2021. FINDINGS: Brain: No evidence of acute large vascular  territory infarction, hemorrhage, hydrocephalus, extra-axial collection or mass lesion/mass effect. Patchy white matter hypodensities, nonspecific but compatible with chronic microvascular ischemic disease. Vascular: No hyperdense vessel. Skull: No acute fracture. Sinuses/Orbits: Mild paranasal sinus mucosal thickening. No acute orbital findings. Other: No mastoid effusions. IMPRESSION: No evidence of acute intracranial abnormality. Electronically Signed   By: Feliberto Harts M.D.   On: 06/29/2022 15:35   DG Chest Port 1 View  Result Date: 06/29/2022 CLINICAL DATA:  Sepsis EXAM: PORTABLE CHEST 1 VIEW COMPARISON:  X-ray 02/24/2022 and older.  CT scan 03/19/2022 FINDINGS: Enlarged cardiopericardial silhouette with some vascular congestion. Calcified aorta. No consolidation, pneumothorax or effusion. No edema. Films are under penetrated IMPRESSION: Enlarged cardiopericardial silhouette with vascular congestion. Calcified and tortuous aorta Electronically Signed   By: Karen Kays M.D.   On: 06/29/2022 15:06    Assessment/Plan 1. COPD with chronic bronchitis and emphysema (HCC) ***  2. Wheezing ***    Aleira Deiter K. Biagio Borg [facility] [phone]

## 2022-07-28 ENCOUNTER — Encounter: Payer: Self-pay | Admitting: Student

## 2022-07-28 ENCOUNTER — Non-Acute Institutional Stay (SKILLED_NURSING_FACILITY): Payer: Medicare Other | Admitting: Student

## 2022-07-28 DIAGNOSIS — J439 Emphysema, unspecified: Secondary | ICD-10-CM

## 2022-07-28 DIAGNOSIS — J4489 Other specified chronic obstructive pulmonary disease: Secondary | ICD-10-CM

## 2022-07-28 DIAGNOSIS — I480 Paroxysmal atrial fibrillation: Secondary | ICD-10-CM | POA: Diagnosis not present

## 2022-07-28 DIAGNOSIS — I5032 Chronic diastolic (congestive) heart failure: Secondary | ICD-10-CM

## 2022-07-28 NOTE — Progress Notes (Signed)
Location:  Other [place].  Nursing Home Room Number: [facility] [address] Place of Service:  SNF [phone]) Provider:  Dr. Earnestine Mealing  PCP: Alexandria Nam, MD  Patient Care Team: Alexandria Nam, MD as PCP - General Mariah Milling Tollie Pizza, MD as PCP - Cardiology (Cardiology) Mariah Milling Tollie Pizza, MD as Consulting Physician (Cardiology) Oneta Rack, NP as Nurse Practitioner (Adult Health Nurse Practitioner) Earna Coder, MD as Consulting Physician (Oncology)  Extended Emergency Contact Information Primary Emergency Contact: Garrison [place] Address: [address] [place], [place] [address] [place] Home Phone: [phone] Mobile Phone: [phone] Relation: Daughter Secondary Emergency Contact: [facility] Phone: [phone] Relation: Relative  Code Status:  Full Code  Goals of care: Advanced Directive information    07/28/2022    3:52 PM  Advanced Directives  Does Patient Have a Medical Advance Directive? Yes  Type of Advance Directive Healthcare Power of Attorney  Does patient want to make changes to medical advance directive? No - Patient declined  Copy of Healthcare Power of Attorney in Chart? Yes - validated most recent copy scanned in chart (See row information)     Chief Complaint  Patient presents with  . Acute Visit    CHF    HPI:  Pt is a 87 y.o. female seen today for an acute visit for CHF  Patient's weights for the last 72 hours  She is feeling like her normal self and ready to go home tomorrow. She continues to have some swelling in her legs, but needs help getting her compression stockings in place  They aren't able to give the metolazone 30 minutes before   She feels like her personality is "different." She was "great last week." She was in great moods, but she feels like she needs a bit more time to get fully settled. Numerous questions about kidney function, electrolyte monitoring with the direutics. Noted she  has a Cardiology appointment next week. Her sister who is in town who will help with  The sister who was going to help with her care is not available until after Monday and they are wondering what can be done. She has a family and job and wishes she could be around more, empathetic statement offered. Past Medical History:  Diagnosis Date  . Allergy, unspecified not elsewhere classified   . Anxiety   . Aortic stenosis, mild 03/30/2022   a. TTE 03/29/2022: EF 55-60, no RWMA, GR 1 DD, normal RVSF, mild aortic stenosis (mean 10, V-max 212 cm/s, DI 0.70)  . Cataract   . Chronic diastolic congestive heart failure (HCC)   . COPD (chronic obstructive pulmonary disease) (HCC) 03/08/1998   PFTs 12/12/2002 FEV 1 1.42 (64%) ratio 58 with no better after B2 and DLCO75%; PFTs 11/20/09 FEV1 1.50 (73%) ratio 50 no better after B2 with DLCO 62%; Hfa 50% 11/20/2009 >75%, 01/13/10 p coaching  . Coronary artery calcification seen on CT scan    a. 2012 Neg stress test.  . Depression 12/07/1998  . Diabetes mellitus type II 02/05/2006   Dr. Elvera Lennox with endo  . Disorders of bursae and tendons in shoulder region, unspecified    Rotator cuff syndrome, right  . E. coli bacteremia   . Esophagitis   . GERD (gastroesophageal reflux disease)   . History of UTI   . Hyperlipidemia 10/04/2000  . Hypertension 03/08/1992  . Hypothyroidism 03/08/1968  . Iron deficiency anemia   . Lung nodule  radiation starts 10-08-2019  . Malignant neoplasm of right upper lobe of lung (HCC) 07/24/2019  . Obesity    NOS  . OSA (obstructive sleep apnea)    PSG 01/27/10 AHI 13, pt does not know CPAP settings  . Peripheral neuropathy    Likely due to DM per Dr. Sandria Manly  . Ventral hernia    Past Surgical History:  Procedure Laterality Date  . ABD U/S  03/19/1999   Nml x2 foci in liver  . ADENOSINE MYOVIEW  06/02/2007   Nml  . CARDIOLITE PERSANTINE  08/24/2000   Nml  . CAROTID U/S  08/24/2000   1-39% ICA stenosis  . CAROTID U/S   06/02/2007   No apprec change   . CARPAL TUNNEL RELEASE  12/1997   Right  . CESAREAN SECTION     x2 Breech/ repeat  . CHOLECYSTECTOMY  1997  . COLONOSCOPY WITH PROPOFOL N/A 02/12/2016   Procedure: COLONOSCOPY WITH PROPOFOL;  Surgeon: Rachael Fee, MD;  Location: [facility];  Service: Endoscopy;  Laterality: N/A;  . CT ABD W & PELVIS WO/W CM     Abd hemangiomas of liver, 1 cm R renal cyst  . DENTAL SURGERY  2016   Implants  . DEXA  07/03/2003   Nml  . ESOPHAGOGASTRODUODENOSCOPY  12/05/1997   Nml (due to hoarseness)  . ESOPHAGOGASTRODUODENOSCOPY (EGD) WITH PROPOFOL N/A 02/12/2016   Procedure: ESOPHAGOGASTRODUODENOSCOPY (EGD) WITH PROPOFOL;  Surgeon: Rachael Fee, MD;  Location: [facility] END[facility];  Service: Endoscopy;  Laterality: N/A;  . GALLBLADDER SURGERY    . HERNIA REPAIR  01/24/2009   Lap Ventr w/ Lysis of adhesions (Dr. Dwain Sarna)  . knee arthroscopic surgery  years ago   right  . ROTATOR CUFF REPAIR  1984   Right, [place]  . SHOULDER OPEN ROTATOR CUFF REPAIR  02/08/2012   Procedure: ROTATOR CUFF REPAIR SHOULDER OPEN;  Surgeon: Drucilla Schmidt, MD;  Location: [facility];  Service: Orthopedics;  Laterality: Left;  Left Shoulder Open Anterior Acrominectomy Rotator Cuff Repair Open Distal Clavicle Resection ,tissue mend graft, and repair of biceps tendon  . THUMB RELEASE  12/1997   Right  . TONSILLECTOMY    . TOTAL ABDOMINAL HYSTERECTOMY  1985   Due to dysmennorhea  . US ECHOCARDIOGRAPHY  06/02/2007    Allergies  Allergen Reactions  . Antihistamines, Diphenhydramine-Type Other (See Comments)    Able to tolerate only allegra.   . Sulfonamide Derivatives Swelling    REACTION: closed throat  . Incruse Ellipta [Umeclidinium Bromide] Cough    Caused voice change and severe coughing  . Clarithromycin Other (See Comments)    REACTION: diff swallowing and mouth blisters  . Codeine Other (See Comments)    Unknown   . Lyrica [Pregabalin] Other (See Comments)    Lack of effect  for neuropathy pain.    Marland Kitchen Spiriva Handihaler [Tiotropium Bromide Monohydrate] Other (See Comments)    Voice changes  . Vraylar [Cariprazine]     intolerant    Outpatient Encounter Medications as of 07/28/2022  Medication Sig  . acetaminophen (TYLENOL) 325 MG tablet Take 2 tablets (650 mg total) by mouth every 6 (six) hours as needed for mild pain, headache, fever or moderate pain.  Marland Kitchen albuterol (PROAIR HFA) 108 (90 Base) MCG/ACT inhaler Inhale 2 puffs into the lungs every 6 (six) hours as needed for wheezing or shortness of breath. INHALE 2 PUFFS INTO THE LUNGS EVERY 6 HOURS AS NEEDED FOR WHEEZING OR SHORTNESS OF BREATH  . amiodarone (PACERONE) 200  MG tablet Take 200 mg by mouth daily.  Marland Kitchen arformoterol (BROVANA) 15 MCG/2ML NEBU Take 2 mLs (15 mcg total) by nebulization at bedtime.  . bisoprolol (ZEBETA) 5 MG tablet Take 1 tablet (5 mg total) by mouth daily.  . budesonide (PULMICORT) 0.5 MG/2ML nebulizer solution Take 2 mLs (0.5 mg total) by nebulization in the morning and at bedtime.  . busPIRone (BUSPAR) 15 MG tablet Take 15 mg by mouth 3 (three) times daily.  . Cholecalciferol (VITAMIN D) 50 MCG (2000 UT) tablet Take 1 tablet (2,000 Units total) by mouth daily.  . Coenzyme Q-10 200 MG CAPS Take 200 mg by mouth daily.  . Cyanocobalamin (B-12) 5000 MCG CAPS Take 5,000 mcg by mouth daily.  Marland Kitchen esomeprazole (NEXIUM) 40 MG capsule Take 1 capsule (40 mg total) by mouth daily at 12 noon.  . ezetimibe (ZETIA) 10 MG tablet TAKE 1 TABLET BY MOUTH EVERYDAY AT BEDTIME  . ferrous sulfate 325 (65 FE) MG tablet TAKE 1 TABLET BY MOUTH EVERY DAY  . FLUoxetine (PROZAC) 20 MG tablet Take 20 mg by mouth 2 (two) times daily.  Marland Kitchen glucose blood (ONETOUCH VERIO) test strip USE AS INSTRUCTED TO CHECK SUGAR 1 TIME DAILY  . guaiFENesin (MUCINEX) 600 MG 12 hr tablet Take 2 tablets (1,200 mg total) by mouth 2 (two) times daily as needed for cough or to loosen phlegm.  Marland Kitchen levothyroxine (SYNTHROID) 125 MCG tablet Take 1 tablet  (125 mcg total) by mouth daily.  . magnesium 30 MG tablet Take 30 mg by mouth 2 (two) times daily.  . melatonin 5 MG TABS Take 1 tablet (5 mg total) by mouth at bedtime.  . metFORMIN (GLUCOPHAGE) 500 MG tablet Take 1 tablet (500 mg total) by mouth every evening.  . metolazone (ZAROXOLYN) 2.5 MG tablet Take 2.5 mg by mouth daily.  . nitroGLYCERIN (NITROSTAT) 0.4 MG SL tablet Place 1 tablet (0.4 mg total) under the tongue every 5 (five) minutes as needed for chest pain.  Marland Kitchen nystatin (MYCOSTATIN) 100000 UNIT/ML suspension Take 5 mLs (500,000 Units total) by mouth 4 (four) times daily.  Letta Pate Delica Lancets 33G MISC Use 1x a day  . OXYGEN Inhale into the lungs. 2lpm  . rosuvastatin (CRESTOR) 10 MG tablet Take 1 tablet (10 mg total) by mouth every other day.  Marland Kitchen Spacer/Aero Chamber [employer] Use with  inhaler as needed.  J44.9  . torsemide (DEMADEX) 20 MG tablet Take 20 mg by mouth daily.  . Zinc Oxide (TRIPLE PASTE) 12.8 % ointment Apply 1 Application topically as needed for irritation. Every shift.   No facility-administered encounter medications on file as of 07/28/2022.    Review of Systems  Immunization History  Administered Date(s) Administered  . DT (Pediatric) 12/23/2010  . Fluad Quad(high Dose 65+) 12/28/2018, 12/05/2019, 03/16/2022  . H1N1 05/30/2008  . Influenza Split 12/23/2010, 12/16/2011, 01/08/2013  . Influenza Whole 12/07/1995, 12/07/2005, 01/04/2008, 01/06/2010  . Influenza, High Dose Seasonal PF 01/06/2016, 01/06/2017, 01/07/2021  . Influenza,inj,Quad PF,6+ Mos 12/26/2013, 12/05/2014, 11/28/2017  . Influenza-Unspecified 03/16/2022  . PFIZER(Purple Top)SARS-COV-2 Vaccination 03/27/2019, 04/18/2019, 01/18/2020  . PPD Test 10/02/2014  . Pneumococcal Conjugate-13 08/17/2013  . Pneumococcal Polysaccharide-23 12/06/2000, 03/08/2005, 10/24/2008, 12/28/2018  . Td 03/08/1993, 10/04/2000, 12/23/2010  . Tdap 10/01/2016, 04/30/2021  . Zoster, Live 12/07/2005   Pertinent   Health Maintenance Due  Topic Date Due  . OPHTHALMOLOGY EXAM  06/24/2022  . HEMOGLOBIN A1C  09/14/2022  . INFLUENZA VACCINE  10/07/2022  . FOOT EXAM  10/17/2022  .  DEXA SCAN  Completed      02/10/2022    2:30 PM 02/17/2022    1:54 PM 03/04/2022    3:31 PM 03/10/2022    3:00 PM 03/10/2022    4:06 PM  Fall Risk  (RETIRED) Patient Fall Risk Level High fall risk High fall risk Moderate fall risk High fall risk High fall risk   Functional Status Survey:    Vitals:   07/28/22 1547  BP: 139/77  Pulse: (!) 56  Temp: 97.6 F (36.4 C)  SpO2: 97%  Weight: 230 lb 9.6 oz (104.6 kg)  Height: 5\' 5"  (1.651 m)   Body mass index is 38.37 kg/m. Physical Exam  Labs reviewed: Recent Labs    07/03/22 0629 07/04/22 0553 07/05/22 0632 07/06/22 1048 07/07/22 0718 07/08/22 0000  NA 141   < > 143 143 144 143  K 4.1   < > 3.9 3.9 3.9 3.9  CL 102   < > 101 97* 100 101  CO2 32   < > 34* 34* 36* 39*  GLUCOSE 105*   < > 107* 182* 104*  --   BUN 40*   < > 41* 41* 46* 40*  CREATININE 1.11*   < > 1.18* 1.22* 1.25* 1.2*  CALCIUM 8.6*   < > 8.7* 8.7* 8.6* 8.7  MG 2.2  --   --  1.9 2.0  --   PHOS 3.3  --   --  3.4 3.4  --    < > = values in this interval not displayed.   Recent Labs    03/10/22 1446 06/29/22 1302 06/29/22 1437 07/08/22 0000  AST 17 16 16 14   ALT 12 15 16 22   ALKPHOS 84 104 100 78  BILITOT 0.4 0.5 0.8  --   PROT 7.2 6.5 6.3*  --   ALBUMIN 3.7 2.9* 2.9* 3.2*   Recent Labs    06/29/22 1437 06/30/22 0631 07/01/22 0611 07/05/22 0632 07/06/22 1048 07/07/22 0718 07/22/22 0000  WBC 12.3* 8.3   < > 6.6 11.6* 7.6 5.7  NEUTROABS 11.0* 8.1*  --   --   --   --  4,161.00  HGB 8.1* 8.3*   < > 9.0* 9.3* 9.1* 8.9*  HCT 28.4* 29.8*   < > 30.3* 32.5* 30.6* 29*  MCV 101.1* 103.8*   < > 93.2 95.3 93.9  --   PLT 136* 131*   < > 278 343 324 117*   < > = values in this interval not displayed.   Lab Results  Component Value Date   TSH 0.899 06/30/2022   Lab Results  Component  Value Date   HGBA1C 5.3 03/16/2022   Lab Results  Component Value Date   CHOL 140 08/06/2021   HDL 57.80 08/06/2021   LDLCALC 62 08/06/2021   LDLDIRECT 63.0 07/29/2015   TRIG 99.0 08/06/2021   CHOLHDL 2 08/06/2021    Significant Diagnostic Results in last 30 days:  DG Chest Port 1 View  Result Date: 07/05/2022 CLINICAL DATA:  Dyspnea on exertion EXAM: PORTABLE CHEST 1 VIEW COMPARISON:  06/29/2022 FINDINGS: Enlarged cardiopericardial silhouette with vascular congestion. Tiny pleural effusions. No pneumothorax. Calcified aorta. Overlapping cardiac leads. Film is under penetrated IMPRESSION: Enlarged cardiopericardial silhouette with vascular congestion. Tiny effusions. Calcified aorta Electronically Signed   By: Karen Kays M.D.   On: 07/05/2022 17:58   ECHOCARDIOGRAM COMPLETE  Result Date: 07/01/2022    ECHOCARDIOGRAM REPORT   Patient Name:   Alexandria [name] Date of  Exam: 06/30/2022 Medical Rec #:  [id number]       Height:       65.0 in Accession #:    [id number]      Weight:       230.6 lb Date of Birth:  1936/02/25       BSA:          2.102 m Patient Age:    86 years        BP:           118/55 mmHg Patient Gender: F               HR:           72 bpm. Exam Location:  [facility] Procedure: 2D Echo, Cardiac Doppler, Color Doppler and Intracardiac            Opacification Agent Indications:     I48.91 Atrial Fibrillation  History:         Patient has prior history of Echocardiogram examinations, most                  recent 03/29/2022. COPD; Risk Factors:Hypertension, Diabetes and                  Dyslipidemia. Obstructive sleep apnea. Hypothyroidism.  Sonographer:     Daphine Deutscher RDCS Referring Phys:  [id number] Lanney Gins FOUST Diagnosing Phys: Julien Nordmann MD IMPRESSIONS  1. Left ventricular ejection fraction, by estimation, is 60 to 65%. The left ventricle has normal function. The left ventricle has no regional wall motion abnormalities. Left ventricular diastolic parameters are indeterminate.   2. Right ventricular systolic function is normal. The right ventricular size is normal.  3. The mitral valve is normal in structure. Mild mitral valve regurgitation. No evidence of mitral stenosis. Moderate mitral annular calcification.  4. The aortic valve is normal in structure. Aortic valve regurgitation is not visualized. Mild aortic valve stenosis. Aortic valve area, by VTI measures 1.64 cm. Aortic valve mean gradient measures 15.0 mmHg. Aortic valve Vmax measures 2.32 m/s.  5. The inferior vena cava is normal in size with greater than 50% respiratory variability, suggesting right atrial pressure of 3 mmHg. FINDINGS  Left Ventricle: Left ventricular ejection fraction, by estimation, is 60 to 65%. The left ventricle has normal function. The left ventricle has no regional wall motion abnormalities. Definity contrast agent was given IV to delineate the left ventricular  endocardial borders. The left ventricular internal cavity size was normal in size. There is no left ventricular hypertrophy. Left ventricular diastolic parameters are indeterminate. Right Ventricle: The right ventricular size is normal. No increase in right ventricular wall thickness. Right ventricular systolic function is normal. Left Atrium: Left atrial size was normal in size. Right Atrium: Right atrial size was normal in size. Pericardium: There is no evidence of pericardial effusion. Mitral Valve: The mitral valve is normal in structure. Moderate mitral annular calcification. Mild mitral valve regurgitation. No evidence of mitral valve stenosis. Tricuspid Valve: The tricuspid valve is normal in structure. Tricuspid valve regurgitation is not demonstrated. No evidence of tricuspid stenosis. Aortic Valve: The aortic valve is normal in structure. Aortic valve regurgitation is not visualized. Mild aortic stenosis is present. Aortic valve mean gradient measures 15.0 mmHg. Aortic valve peak gradient measures 21.5 mmHg. Aortic valve area, by VTI  measures 1.64 cm. Pulmonic Valve: The pulmonic valve was normal in structure. Pulmonic valve regurgitation is not visualized. No evidence of pulmonic stenosis. Aorta: The aortic root is normal in  size and structure. Venous: The inferior vena cava is normal in size with greater than 50% respiratory variability, suggesting right atrial pressure of 3 mmHg. IAS/Shunts: No atrial level shunt detected by color flow Doppler.  LEFT VENTRICLE PLAX 2D LVIDd:         4.90 cm   Diastology LVIDs:         3.30 cm   LV e' medial:    9.03 cm/s LV PW:         1.00 cm   LV E/e' medial:  16.8 LV IVS:        1.00 cm   LV e' lateral:   11.60 cm/s LVOT diam:     1.80 cm   LV E/e' lateral: 13.1 LV SV:         75 LV SV Index:   36 LVOT Area:     2.54 cm  RIGHT VENTRICLE             IVC RV Basal diam:  3.70 cm     IVC diam: 1.70 cm RV S prime:     12.15 cm/s TAPSE (M-mode): 2.1 cm LEFT ATRIUM             Index        RIGHT ATRIUM           Index LA diam:        5.10 cm 2.43 cm/m   RA Area:     13.00 cm LA Vol (A2C):   93.2 ml 44.35 ml/m  RA Volume:   29.60 ml  14.09 ml/m LA Vol (A4C):   68.3 ml 32.50 ml/m LA Biplane Vol: 80.8 ml 38.45 ml/m  AORTIC VALVE AV Area (Vmax):    1.61 cm AV Area (Vmean):   1.49 cm AV Area (VTI):     1.64 cm AV Vmax:           231.75 cm/s AV Vmean:          166.250 cm/s AV VTI:            0.458 m AV Peak Grad:      21.5 mmHg AV Mean Grad:      15.0 mmHg LVOT Vmax:         147.00 cm/s LVOT Vmean:        97.100 cm/s LVOT VTI:          0.296 m LVOT/AV VTI ratio: 0.65  AORTA Ao Root diam: 3.00 cm MITRAL VALVE MV Area (PHT): 3.29 cm     SHUNTS MV Decel Time: 231 msec     Systemic VTI:  0.30 m MV E velocity: 152.00 cm/s  Systemic Diam: 1.80 cm MV A velocity: 120.75 cm/s MV E/A ratio:  1.26 Julien Nordmann MD Electronically signed by Julien Nordmann MD Signature Date/Time: 07/01/2022/7:52:14 AM    Final    CT Head Wo Contrast  Result Date: 06/29/2022 CLINICAL DATA:  Mental status change, unknown cause EXAM: CT  HEAD WITHOUT CONTRAST TECHNIQUE: Contiguous axial images were obtained from the base of the skull through the vertex without intravenous contrast. RADIATION DOSE REDUCTION: This exam was performed according to the departmental dose-optimization program which includes automated exposure control, adjustment of the mA and/or kV according to patient size and/or use of iterative reconstruction technique. COMPARISON:  CT head April 30, 2021. FINDINGS: Brain: No evidence of acute large vascular territory infarction, hemorrhage, hydrocephalus, extra-axial collection or mass lesion/mass effect. Patchy white matter hypodensities, nonspecific but compatible with chronic microvascular  ischemic disease. Vascular: No hyperdense vessel. Skull: No acute fracture. Sinuses/Orbits: Mild paranasal sinus mucosal thickening. No acute orbital findings. Other: No mastoid effusions. IMPRESSION: No evidence of acute intracranial abnormality. Electronically Signed   By: Feliberto Harts M.D.   On: 06/29/2022 15:35   DG Chest Port 1 View  Result Date: 06/29/2022 CLINICAL DATA:  Sepsis EXAM: PORTABLE CHEST 1 VIEW COMPARISON:  X-ray 02/24/2022 and older.  CT scan 03/19/2022 FINDINGS: Enlarged cardiopericardial silhouette with some vascular congestion. Calcified aorta. No consolidation, pneumothorax or effusion. No edema. Films are under penetrated IMPRESSION: Enlarged cardiopericardial silhouette with vascular congestion. Calcified and tortuous aorta Electronically Signed   By: Karen Kays M.D.   On: 06/29/2022 15:06    Assessment/Plan There are no diagnoses linked to this encounter.   Family/ staff Communication: ***  Labs/tests ordered:  ***

## 2022-07-29 MED ORDER — AMIODARONE HCL 200 MG PO TABS
200.0000 mg | ORAL_TABLET | Freq: Every day | ORAL | 0 refills | Status: DC
Start: 2022-07-29 — End: 2022-08-03

## 2022-07-30 ENCOUNTER — Other Ambulatory Visit: Payer: Self-pay | Admitting: Family Medicine

## 2022-07-30 MED ORDER — BISOPROLOL FUMARATE 5 MG PO TABS
5.0000 mg | ORAL_TABLET | Freq: Every day | ORAL | 0 refills | Status: DC
Start: 2022-07-30 — End: 2022-08-03

## 2022-07-30 NOTE — Addendum Note (Signed)
Addended by: Earnestine Mealing on: 07/30/2022 11:21 AM   Modules accepted: Orders

## 2022-08-03 ENCOUNTER — Encounter: Payer: Self-pay | Admitting: Family Medicine

## 2022-08-03 ENCOUNTER — Ambulatory Visit (INDEPENDENT_AMBULATORY_CARE_PROVIDER_SITE_OTHER): Payer: Medicare Other | Admitting: Family Medicine

## 2022-08-03 ENCOUNTER — Encounter: Payer: Self-pay | Admitting: Student

## 2022-08-03 VITALS — BP 110/40 | HR 54 | Temp 97.9°F

## 2022-08-03 DIAGNOSIS — Z7189 Other specified counseling: Secondary | ICD-10-CM | POA: Diagnosis not present

## 2022-08-03 DIAGNOSIS — I480 Paroxysmal atrial fibrillation: Secondary | ICD-10-CM

## 2022-08-03 DIAGNOSIS — I4891 Unspecified atrial fibrillation: Secondary | ICD-10-CM | POA: Diagnosis not present

## 2022-08-03 DIAGNOSIS — J9611 Chronic respiratory failure with hypoxia: Secondary | ICD-10-CM

## 2022-08-03 LAB — BASIC METABOLIC PANEL
BUN: 56 mg/dL — ABNORMAL HIGH (ref 6–23)
CO2: 40 mEq/L — ABNORMAL HIGH (ref 19–32)
Calcium: 8.8 mg/dL (ref 8.4–10.5)
Chloride: 94 mEq/L — ABNORMAL LOW (ref 96–112)
Creatinine, Ser: 1.62 mg/dL — ABNORMAL HIGH (ref 0.40–1.20)
GFR: 28.91 mL/min — ABNORMAL LOW (ref >60.00)
Glucose, Bld: 102 mg/dL — ABNORMAL HIGH (ref 70–99)
Potassium: 3.8 mEq/L (ref 3.5–5.1)
Sodium: 142 mEq/L (ref 135–145)

## 2022-08-03 LAB — CBC WITH DIFFERENTIAL/PLATELET
Basophils Absolute: 0 10*3/uL (ref 0.0–0.1)
Basophils Relative: 0.4 % (ref 0.0–3.0)
Eosinophils Absolute: 0.1 10*3/uL (ref 0.0–0.7)
Eosinophils Relative: 0.9 % (ref 0.0–5.0)
HCT: 27.6 % — ABNORMAL LOW (ref 36.0–46.0)
Hemoglobin: 8.8 g/dL — ABNORMAL LOW (ref 12.0–15.0)
Lymphocytes Relative: 21.4 % (ref 12.0–46.0)
Lymphs Abs: 1.5 10*3/uL (ref 0.7–4.0)
MCHC: 31.9 g/dL (ref 30.0–36.0)
MCV: 92.1 fl (ref 78.0–100.0)
Monocytes Absolute: 0.6 10*3/uL (ref 0.1–1.0)
Monocytes Relative: 8.4 % (ref 3.0–12.0)
Neutro Abs: 4.8 10*3/uL (ref 1.4–7.7)
Neutrophils Relative %: 68.9 % (ref 43.0–77.0)
Platelets: 217 10*3/uL (ref 150.0–400.0)
RBC: 2.99 Mil/uL — ABNORMAL LOW (ref 3.87–5.11)
RDW: 20.9 % — ABNORMAL HIGH (ref 11.5–15.5)
WBC: 7 10*3/uL (ref 4.0–10.5)

## 2022-08-03 MED ORDER — AMIODARONE HCL 200 MG PO TABS: 200.0000 mg | ORAL_TABLET | Freq: Every day | ORAL | 0 refills | Status: DC

## 2022-08-03 MED ORDER — BISOPROLOL FUMARATE 5 MG PO TABS: 5.0000 mg | ORAL_TABLET | Freq: Every day | ORAL | 0 refills | Status: DC

## 2022-08-03 NOTE — Patient Instructions (Signed)
Check with cardiology about keeping or moving the appointment back.   Go to the lab on the way out.   If you have mychart we'll likely use that to update you.    Take care.  Glad to see you. Talk with your family about your wishes in case you get dramatically sicker.

## 2022-08-03 NOTE — Progress Notes (Unsigned)
Discharge Diagnoses:  Principal Problem:   Acute on chronic respiratory failure with hypoxia and hypercapnia (HCC) Active Problems:   COPD exacerbation (HCC)   Acute on chronic heart failure with preserved ejection fraction (HFpEF) (HCC)   Acute encephalopathy   Hypothyroidism   Obstructive sleep apnea   Essential hypertension   Chronic diastolic CHF (congestive heart failure) (HCC)   Symptomatic anemia   Type 2 diabetes mellitus with diabetic neuropathy, without long-term current use of insulin (HCC)   Paroxysmal atrial fibrillation (HCC)   Recommendations for Outpatient Follow-up:  Follow with PCP, patient needs to be seen by an MD in 1 to 2 days at the facility, repeat BMP in 1 week, use torsemide every other day and titrate dose according to renal functions.   Follow with cardiologist in 1 to 2 weeks, patient needs Zio patch to monitor cardiac rhythm. Follow-up with hematologist for iron infusions as per schedule. Patient is having hallucinations and difficulty sleeping, does not want psych consult and refused any medication for hallucinations.  Patient will benefit from follow-up with psych as an outpatient if she agrees. Follow up LABS/TEST:  CBC and BMP in 1 wk. Zio patch will be mailed to her house from cardiology office.   Assessment and Plan:   # Metabolic encephalopathy, multifactorial could be due to hypoxic and hypercapnic respiratory failure, UTI and A-fib with RVR.  Encephalopathy resolved, patient is back to her baseline. Continue supportive care and management as below. 4/27 patient mentioned about seeing and talking to the people who do not exist.  It is possible that patient might be having hallucination which is causing insomnia and encephalopathy.     # Acute hypoxic and hypercapnic respiratory failure most likely COPD exacerbation and OSA, hypoventilation syndrome due to obesity ABG reviewed, improved after BiPAP. S/p BiPAP overnight, use BiPAP as needed S/p  Solu-Medrol 125 mg x 1 dose given, s/p prednisone 40 mg p.o.d x 4 days 4/27 still mild wheezing noticed, started prednisone 30 mg for 3 days, 20 mg for 3 days and 10 mg for 3 days. Resumed Brovana nebulizer twice daily, continue Pulmicort nebulizer twice daily. S/p DuoNeb every 6 hourly scheduled, changed to as needed after improvement. S/p doxycycline 100 mg BID x days, prophylaxis for atypical infection. Patient was advised to follow with pulmonology as an outpatient, patient may need BiPAP for home use versus sleep study and CPAP.   # Transient paroxysmal A-fib with RVR, resolved, converted back to normal sinus rhythm. S/p amnio bolus was given, IV infusion was discontinued. Started  amiodarone 200 mg p.o. BID. S/p Lopressor 5 mg IV given, Patient is not a good candidate for anticoagulation due to risk of fall, anemia requiring transfusions. 4/25 started bisoprolol 5 mg. Cardiology consulted, recommended Zio patch, which will be sent to her house. Continue amiodarone 200 mg twice daily for 2 weeks followed by 200 mg p.o. daily.  And continue bisoprolol 5 mg p.o. daily   # Chronic diastolic heart failure BNP 245, slightly elevated, S/p Lasix 40 mg IV daily, Held torsemide 20 mg home dose. As per cardiology resume torsemide 20 mg on discharge.so from 4/28 --4/30 Torsemide 20 mg p.o. daily home dose x 3 days was given.  Noticed creatinine slightly elevated so changed torsemide to every other day on discharge.  Monitor BMP and volume status and then titrate dose of torsemide accordingly.   # UTI, and E. coli bacteremia UA positive, urine culture growing E. coli, sensitive to ceftriaxone, cefazolin and ciprofloxacin. S/p  ceftriaxone 2 g IV daily. Blood culture positive for E. coli, pansensitive. On 4/29 d/w ID recommended total 10 days of antibiotics, switched to cefadroxil 1 g BID on 4/30 for 3 days.  4/29 repeat blood cultures NGTD.    # Anemia of chronic disease and iron deficiency Patient follows  hematologist as an outpatient and getting iron infusions as an outpatient. Hb 7.6, transfuse 1 unit of PRBC due to high risk for symptomatic anemia and prevent rehospitalization. 5/01 Hb 9.1 remained stable iron profile, iron level 43 and transferrin saturation 15%, within normal range. Recommend to continue oral iron supplement and follow with hematologist as an outpatient. 4/30 repeated iron profile as per request of patient's daughter, iron 42, transferrin saturation 13%.  Recommended to follow with hematologist for iron transfusion as an outpatient. On 06/16/22 Vitamin B12 level was 303, goal >400, vitamin B12 1000 mcg IM injection daily x 3 doses ordered followed by oral supplement.   # NIDDM T2, held home medications during hospital stay, patient was on NovoLog sliding scale.  Resumed home medications on discharge.  Monitor CBG, continue diabetic diet.   # Depression, continued Prozac and resumed BuSpar on discharge. # Hypothyroid, continue Synthroid # Possible Hallucinations? 4/27 patient stated that she is seeing different people and hearing voices.  It is questionable that she may be communicating in her dreams.  Patient's daughter thinks that she is not hallucinating, and does not want psych consult and does not want any treatment. We will continue supportive care and continue to monitor. Started melatonin 3 mg low-dose, it may help her to have a good sleep. 4/28 patient did not sleep properly last night due to hallucinations.  Does not want psych consult and does not want any medications for that.  Discussed with patient's daughter as well who does not want to involve psych at this time. 4/29 still having visual and auditory hallucinations, does not want psych consult and does not want any treatment. 4/30 patient is still having hallucinations as per caregiver but she denied to me.    ================================================= Needed refill on bisoprolol.  Rx sent.  Zio results  pending.    Goals of care d/w pt.  She is considering options and will d/w family.

## 2022-08-04 ENCOUNTER — Telehealth: Payer: Self-pay

## 2022-08-04 NOTE — Telephone Encounter (Signed)
Per Lauren:  "I got blood results for this patient from 5/16 and doesn't look like she's scheduled until 6/6. Is there any way she could come in either this week or earlier next week for repeat lab, see me, poss venofer? Her counts are awfully borderline and I'm guessing she'd benefit from sooner f/u."  Called and spoke to Heimdal, patients daughter to advise on moving appt up to this week or beginning of next week. Daughter denies able to bring her in this week with her getting discharged from rehab and having a appt on Friday for her AFIB. Advised we would move up to beginning of week per Consuello Masse request. [name] verbalized understanding.

## 2022-08-04 NOTE — Assessment & Plan Note (Signed)
Discussed inpatient status with paroxysmal atrial fibrillation.  Zio patch has been done and I am awaiting the final report on that.  Continue amiodarone in the meantime.  Continue Crestor torsemide and bisoprolol.  She has cardiology follow-up pending.  She sounds to be in regular rate and rhythm and okay for outpatient follow-up.  She is still overall deconditioned and will have extra help/PT at home.

## 2022-08-04 NOTE — Assessment & Plan Note (Signed)
Continue O2 at baseline.  Discussed CODE STATUS.  See above.

## 2022-08-05 ENCOUNTER — Telehealth: Payer: Self-pay | Admitting: Family Medicine

## 2022-08-05 ENCOUNTER — Other Ambulatory Visit: Payer: Self-pay | Admitting: Nurse Practitioner

## 2022-08-05 DIAGNOSIS — D500 Iron deficiency anemia secondary to blood loss (chronic): Secondary | ICD-10-CM

## 2022-08-05 NOTE — Telephone Encounter (Signed)
Rosa from [facility] contacted the office regarding orders sent in for OT, skilled nursing. States that patient was set to have this encounter with them tomorrow, but has rescheduled for Tuesday 6/4. Caller wanted to inform Dr. Para March of this and stated if there were any questions at all to please contact them at [phone].

## 2022-08-05 NOTE — Progress Notes (Signed)
Received message from pcp. Hmg 8.8 which is decreased from baseline but stable. Orders entered for appt next week.

## 2022-08-06 ENCOUNTER — Encounter: Payer: Self-pay | Admitting: Nurse Practitioner

## 2022-08-06 ENCOUNTER — Ambulatory Visit: Payer: Medicare Other | Attending: Nurse Practitioner | Admitting: Nurse Practitioner

## 2022-08-06 ENCOUNTER — Other Ambulatory Visit: Payer: Self-pay

## 2022-08-06 VITALS — BP 110/42 | HR 53 | Ht 65.0 in | Wt 231.0 lb

## 2022-08-06 DIAGNOSIS — R072 Precordial pain: Secondary | ICD-10-CM | POA: Diagnosis not present

## 2022-08-06 DIAGNOSIS — J4489 Other specified chronic obstructive pulmonary disease: Secondary | ICD-10-CM | POA: Diagnosis not present

## 2022-08-06 DIAGNOSIS — Z7984 Long term (current) use of oral hypoglycemic drugs: Secondary | ICD-10-CM

## 2022-08-06 DIAGNOSIS — I951 Orthostatic hypotension: Secondary | ICD-10-CM | POA: Diagnosis not present

## 2022-08-06 DIAGNOSIS — N1832 Chronic kidney disease, stage 3b: Secondary | ICD-10-CM | POA: Diagnosis not present

## 2022-08-06 DIAGNOSIS — I5032 Chronic diastolic (congestive) heart failure: Secondary | ICD-10-CM | POA: Diagnosis not present

## 2022-08-06 DIAGNOSIS — I890 Lymphedema, not elsewhere classified: Secondary | ICD-10-CM | POA: Diagnosis not present

## 2022-08-06 DIAGNOSIS — N183 Chronic kidney disease, stage 3 unspecified: Secondary | ICD-10-CM | POA: Diagnosis not present

## 2022-08-06 DIAGNOSIS — J439 Emphysema, unspecified: Secondary | ICD-10-CM | POA: Insufficient documentation

## 2022-08-06 DIAGNOSIS — I480 Paroxysmal atrial fibrillation: Secondary | ICD-10-CM | POA: Diagnosis not present

## 2022-08-06 DIAGNOSIS — E782 Mixed hyperlipidemia: Secondary | ICD-10-CM | POA: Diagnosis not present

## 2022-08-06 DIAGNOSIS — E1122 Type 2 diabetes mellitus with diabetic chronic kidney disease: Secondary | ICD-10-CM | POA: Insufficient documentation

## 2022-08-06 DIAGNOSIS — N179 Acute kidney failure, unspecified: Secondary | ICD-10-CM | POA: Diagnosis not present

## 2022-08-06 MED ORDER — AMIODARONE HCL 200 MG PO TABS
200.0000 mg | ORAL_TABLET | Freq: Every day | ORAL | 1 refills | Status: DC
Start: 2022-08-06 — End: 2022-10-26

## 2022-08-06 MED ORDER — BISOPROLOL FUMARATE 5 MG PO TABS
2.5000 mg | ORAL_TABLET | Freq: Every day | ORAL | 1 refills | Status: DC
Start: 2022-08-06 — End: 2023-01-08

## 2022-08-06 NOTE — Telephone Encounter (Signed)
Noted. Thanks.

## 2022-08-06 NOTE — Patient Instructions (Addendum)
Medication Instructions:  DECREASE the Bisoprolol to 2.5 mg once daily (half of the 5 mg tablet)  *If you need a refill on your cardiac medications before your next appointment, please call your pharmacy*   Lab Work: None ordered If you have labs (blood work) drawn today and your tests are completely normal, you will receive your results only by: MyChart Message (if you have MyChart) OR A paper copy in the mail If you have any lab test that is abnormal or we need to change your treatment, we will call you to review the results.   Testing/Procedures: None ordered   Follow-Up: At [facility], you and your health needs are our priority.  As part of our continuing mission to provide you with exceptional heart care, we have created designated Provider Care Teams.  These Care Teams include your primary Cardiologist (physician) and Advanced Practice Providers (APPs -  Physician Assistants and Nurse Practitioners) who all work together to provide you with the care you need, when you need it.  We recommend signing up for the patient portal called "MyChart".  Sign up information is provided on this After Visit Summary.  MyChart is used to connect with patients for Virtual Visits (Telemedicine).  Patients are able to view lab/test results, encounter notes, upcoming appointments, etc.  Non-urgent messages can be sent to your provider as well.   To learn more about what you can do with MyChart, go to [email or web address].    Your next appointment:   1 month(s)  Provider:   You may see Julien Nordmann, MD or one of the following Advanced Practice Providers on your designated Care Team:   Nicolasa Ducking, NP Eula Listen, PA-C Cadence Fransico Michael, PA-C Charlsie Quest, NP

## 2022-08-06 NOTE — Progress Notes (Signed)
Office Visit    Patient Name: Alexandria [name] Date of Encounter: 08/06/2022  Primary Care Provider:  Joaquim Nam, MD Primary Cardiologist:  Julien Nordmann, MD  Chief Complaint    87 year old female with history of chronic HFpEF, coronary calcifications, lymphedema, hypertension, hyperlipidemia, COPD on home O2, type 2 diabetes mellitus, stage III chronic kidney disease, mild aortic stenosis, morbid obesity, iron deficiency anemia requiring IV iron and blood transfusions, sleep apnea, severe deconditioning, right upper lobe lung cancer, and depression, who presents for follow-up after recent hospitalization for UTI, altered mental status, and rapid atrial fibrillation.  Past Medical History    Past Medical History:  Diagnosis Date   Allergy, unspecified not elsewhere classified    Anxiety    Aortic stenosis, mild 03/30/2022   a. TTE 03/29/2022: EF 55-60, no RWMA, GR 1 DD, normal RVSF, mild aortic stenosis (mean 10, V-max 212 cm/s, DI 0.70)   Cataract    Chronic diastolic congestive heart failure (HCC)    a. 06/2022 Echo: EF 60-65%, no rwma, nl RV fxn, mild MR, mild AS.   COPD (chronic obstructive pulmonary disease) (HCC) 03/08/1998   PFTs 12/12/2002 FEV 1 1.42 (64%) ratio 58 with no better after B2 and DLCO75%; PFTs 11/20/09 FEV1 1.50 (73%) ratio 50 no better after B2 with DLCO 62%; Hfa 50% 11/20/2009 >75%, 01/13/10 p coaching   Coronary artery calcification seen on CT scan    a. 2012 Neg stress test.   Depression 12/07/1998   Diabetes mellitus type II 02/05/2006   Dr. Elvera Lennox with endo   Disorders of bursae and tendons in shoulder region, unspecified    Rotator cuff syndrome, right   E. coli bacteremia    Esophagitis    GERD (gastroesophageal reflux disease)    History of UTI    Hyperlipidemia 10/04/2000   Hypertension 03/08/1992   Hypothyroidism 03/08/1968   Iron deficiency anemia    Lung nodule    radiation starts 10-08-2019   Malignant neoplasm of right upper lobe of  lung (HCC) 07/24/2019   Obesity    NOS   OSA (obstructive sleep apnea)    PSG 01/27/10 AHI 13, pt does not know CPAP settings   PAF (paroxysmal atrial fibrillation) (HCC)    a. 06/2022 in setting of UTI/encephalopathy-->converted on amio; b. CHA2DS2VASc = 6-->No OAC 2/2 chronic anemia/fall risk; c. 06/2022 Zio: Predominantly sinus bradycardia at 54 (3-158)'s.  1 brief run of SVT.  2.2% PVC burden.   Peripheral neuropathy    Likely due to DM per Dr. Tresa Endo hernia    Past Surgical History:  Procedure Laterality Date   ABD U/S  03/19/1999   Nml x2 foci in liver   ADENOSINE MYOVIEW  06/02/2007   Nml   CARDIOLITE PERSANTINE  08/24/2000   Nml   CAROTID U/S  08/24/2000   1-39% ICA stenosis   CAROTID U/S  06/02/2007   No apprec change    CARPAL TUNNEL RELEASE  12/1997   Right   CESAREAN SECTION     x2 Breech/ repeat   CHOLECYSTECTOMY  1997   COLONOSCOPY WITH PROPOFOL N/A 02/12/2016   Procedure: COLONOSCOPY WITH PROPOFOL;  Surgeon: Rachael Fee, MD;  Location: [facility];  Service: Endoscopy;  Laterality: N/A;   CT ABD W & PELVIS WO/W CM     Abd hemangiomas of liver, 1 cm R renal cyst   DENTAL SURGERY  2016   Implants   DEXA  07/03/2003   Nml  ESOPHAGOGASTRODUODENOSCOPY  12/05/1997   Nml (due to hoarseness)   ESOPHAGOGASTRODUODENOSCOPY (EGD) WITH PROPOFOL N/A 02/12/2016   Procedure: ESOPHAGOGASTRODUODENOSCOPY (EGD) WITH PROPOFOL;  Surgeon: Rachael Fee, MD;  Location: [facility];  Service: Endoscopy;  Laterality: N/A;   GALLBLADDER SURGERY     HERNIA REPAIR  01/24/2009   Lap Ventr w/ Lysis of adhesions (Dr. Dwain Sarna)   knee arthroscopic surgery  years ago   right   ROTATOR CUFF REPAIR  1984   Right, [place]   SHOULDER OPEN ROTATOR CUFF REPAIR  02/08/2012   Procedure: ROTATOR CUFF REPAIR SHOULDER OPEN;  Surgeon: Drucilla Schmidt, MD;  Location: [facility];  Service: Orthopedics;  Laterality: Left;  Left Shoulder Open Anterior Acrominectomy Rotator Cuff Repair Open  Distal Clavicle Resection ,tissue mend graft, and repair of biceps tendon   THUMB RELEASE  12/1997   Right   TONSILLECTOMY     TOTAL ABDOMINAL HYSTERECTOMY  1985   Due to dysmennorhea   US ECHOCARDIOGRAPHY  06/02/2007    Allergies  Allergies  Allergen Reactions   Antihistamines, Diphenhydramine-Type Other (See Comments)    Able to tolerate only allegra.    Incruse Ellipta [Umeclidinium Bromide] Cough    Caused voice change and severe coughing   Clarithromycin Other (See Comments)    REACTION: diff swallowing and mouth blisters   Codeine Other (See Comments)    Unknown    Diphenhydramine    Pregabalin Other (See Comments)    Lack of effect for neuropathy pain.   Sulfonamide Derivatives    Tiotropium Bromide Monohydrate Other (See Comments)    Voice changes   Umeclidinium    Vraylar [Cariprazine]     intolerant    History of Present Illness    87 year old female with history of chronic HFpEF, coronary calcifications, lymphedema, hypertension, hyperlipidemia, COPD on home O2, type 2 diabetes mellitus, stage III chronic kidney disease, mild aortic stenosis, morbid obesity, iron deficiency anemia requiring IV iron and blood transfusions, sleep apnea, severe deconditioning, right upper lobe lung cancer, and depression.  Patient lives locally with caregiver.  She requires assistance for all transfers and does not walk.  She has chronic dyspnea on exertion and uses O2 at home.  She is followed by oncology in the setting of iron deficiency anemia with weekly IV iron infusions.  She was recently admitted to [facility] in the setting of fevers, chills, and lethargy.  She had stable anemia with mild leukocytosis, normal troponin, and UA showed UTI.  She developed Afib w/ RVR in the 140's after removing her BiPAP at night and becoming hypoxic.  She was initially placed on IV amiodarone however, she converted to sinus rhythm and amiodarone was discontinued in the setting of bradycardia.   Echo showed normal LV function with an EF of 60 to 65%, mild MR, mild aortic stenosis.  She subsequently maintained sinus rhythm and was discharged home on amiodarone.  We did not add oral anticoagulation in the setting of anemia with high fall risk.  Following discharge, a ZIO monitor was placed which showed sinus rhythm and 1 brief run of SVT.  No recurrent atrial fibrillation.  2.2% PVC burden.  She initially went to a rehab facility following hospital discharge but has since been at home.  Her sister is present with her today while her daughter is on the phone.  All questions addressed.  She notes that her activity is quite limited.    She is unaware of any recurrent palpitations or fluttering.  She has  chronic dyspnea on exertion and has been using oxygen around-the-clock.  Prior to her hospitalization, her daughter notes that she intermittently complained of chest tightness.  On questioning today, patient notes that was occurring at least once a month, typically at rest, lasting variable amounts of time, and resolving spontaneously.  She has not had any discomfort since her hospitalization.  She has chronic lower extremity edema, which she typically manages using compression socks and torsemide 20 mg daily.  She does not put on her compression socks today and her legs are little more swollen than usual.  Finally, she has been having some orthostatic lightheadedness since being home.  Blood pressures at home have trended at times in the 90s or even high 80s.  Her daughter is concerned about her medicines and contribution to low blood pressure/orthostasis.  She denies PND, orthopnea, syncope, or early satiety.  Home Medications    Current Outpatient Medications  Medication Sig Dispense Refill   acetaminophen (TYLENOL) 325 MG tablet Take 2 tablets (650 mg total) by mouth every 6 (six) hours as needed for mild pain, headache, fever or moderate pain.     albuterol (PROAIR HFA) 108 (90 Base) MCG/ACT inhaler  Inhale 2 puffs into the lungs every 6 (six) hours as needed for wheezing or shortness of breath. INHALE 2 PUFFS INTO THE LUNGS EVERY 6 HOURS AS NEEDED FOR WHEEZING OR SHORTNESS OF BREATH 3 Inhaler 3   arformoterol (BROVANA) 15 MCG/2ML NEBU Take 2 mLs (15 mcg total) by nebulization at bedtime. 120 mL 6   budesonide (PULMICORT) 0.5 MG/2ML nebulizer solution Take 2 mLs (0.5 mg total) by nebulization in the morning and at bedtime. 360 mL 3   busPIRone (BUSPAR) 15 MG tablet Take 15 mg by mouth 3 (three) times daily.     Cholecalciferol (VITAMIN D) 50 MCG (2000 UT) tablet Take 1 tablet (2,000 Units total) by mouth daily.     Coenzyme Q-10 200 MG CAPS Take 200 mg by mouth daily.     Cyanocobalamin (B-12) 5000 MCG CAPS Take 5,000 mcg by mouth daily.     esomeprazole (NEXIUM) 40 MG capsule Take 1 capsule (40 mg total) by mouth daily at 12 noon. 30 capsule 11   ezetimibe (ZETIA) 10 MG tablet TAKE 1 TABLET BY MOUTH EVERYDAY AT BEDTIME 90 tablet 2   ferrous sulfate 325 (65 FE) MG tablet TAKE 1 TABLET BY MOUTH EVERY DAY 90 tablet 3   FLUoxetine (PROZAC) 20 MG tablet Take 20 mg by mouth 2 (two) times daily.     glucose blood (ONETOUCH VERIO) test strip USE AS INSTRUCTED TO CHECK SUGAR 1 TIME DAILY 100 strip 3   guaiFENesin (MUCINEX) 600 MG 12 hr tablet Take 2 tablets (1,200 mg total) by mouth 2 (two) times daily as needed for cough or to loosen phlegm.     levothyroxine (SYNTHROID) 125 MCG tablet Take 1 tablet (125 mcg total) by mouth daily. 90 tablet 3   magnesium 30 MG tablet Take 30 mg by mouth 2 (two) times daily.     melatonin 5 MG TABS Take 1 tablet (5 mg total) by mouth at bedtime.  0   metFORMIN (GLUCOPHAGE) 500 MG tablet Take 1 tablet (500 mg total) by mouth every evening. 90 tablet 3   nitroGLYCERIN (NITROSTAT) 0.4 MG SL tablet Place 1 tablet (0.4 mg total) under the tongue every 5 (five) minutes as needed for chest pain. 25 tablet 3   nystatin (MYCOSTATIN) 100000 UNIT/ML suspension Take 5 mLs (500,000  Units total) by mouth 4 (four) times daily. 120 mL 0   OneTouch Delica Lancets 33G MISC Use 1x a day 100 each 3   OXYGEN Inhale into the lungs. 2lpm     rosuvastatin (CRESTOR) 10 MG tablet TAKE 1 TABLET BY MOUTH EVERY OTHER DAY 45 tablet 2   Spacer/Aero Chamber Mouthpiece MISC Use with  inhaler as needed.  J44.9 1 each 0   torsemide (DEMADEX) 20 MG tablet Take 20 mg by mouth daily.     Zinc Oxide (TRIPLE PASTE) 12.8 % ointment Apply 1 Application topically as needed for irritation. Every shift.     amiodarone (PACERONE) 200 MG tablet Take 1 tablet (200 mg total) by mouth daily. 90 tablet 1   bisoprolol (ZEBETA) 5 MG tablet Take 0.5 tablets (2.5 mg total) by mouth daily. 45 tablet 1   No current facility-administered medications for this visit.     Review of Systems    As above, chronic dyspnea on exertion, some orthostatic lightheadedness, history of chest tightness but none since hospital discharge, and chronic lower extremity edema.  She denies palpitations, PND, orthopnea, syncope, or early satiety.  All other systems reviewed and are otherwise negative except as noted above.    Physical Exam    VS:  BP (!) 110/42 (BP Location: Left Arm, Patient Position: Sitting, Cuff Size: Normal)   Pulse (!) 53   Ht 5\' 5"  (1.651 m)   Wt 231 lb (104.8 kg)   LMP  (LMP Unknown)   SpO2 93% Comment: 2 liters of oxygen  BMI 38.44 kg/m  , BMI Body mass index is 38.44 kg/m.     GEN: Well nourished, well developed, in no acute distress. HEENT: normal. Neck: Supple, no JVD, carotid bruits, or masses. Cardiac: RRR, 2/6 systolic murmur at the upper sternal borders.  No rubs or gallops. No clubbing, cyanosis.  1-2+ bilateral lower extremity edema to the calves.  Mild erythema over lower legs without drainage or warmth.  Radials 2+/PT 2+ and equal bilaterally.  Respiratory:  Respirations regular and unlabored, diminished breath sounds with expiratory wheezing anteriorly GI: Obese, soft, nontender,  nondistended, BS + x 4. MS: no deformity or atrophy. Skin: warm and dry, no rash. Neuro:  Strength and sensation are intact. Psych: Normal affect.  Accessory Clinical Findings    ECG personally reviewed by me today -sinus bradycardia, 53, baseline artifact, nonspecific ST changes.  QTc 485- no acute changes.  Lab Results  Component Value Date   WBC 7.0 08/03/2022   HGB 8.8 Repeated and verified X2. (L) 08/03/2022   HCT 27.6 (L) 08/03/2022   MCV 92.1 08/03/2022   PLT 217.0 08/03/2022   Lab Results  Component Value Date   CREATININE 1.62 (H) 08/03/2022   BUN 56 (H) 08/03/2022   NA 142 08/03/2022   K 3.8 08/03/2022   CL 94 (L) 08/03/2022   CO2 40 (H) 08/03/2022   Lab Results  Component Value Date   ALT 22 07/08/2022   AST 14 07/08/2022   ALKPHOS 78 07/08/2022   BILITOT 0.8 06/29/2022   Lab Results  Component Value Date   CHOL 140 08/06/2021   HDL 57.80 08/06/2021   LDLCALC 62 08/06/2021   LDLDIRECT 63.0 07/29/2015   TRIG 99.0 08/06/2021   CHOLHDL 2 08/06/2021    Lab Results  Component Value Date   HGBA1C 5.3 03/16/2022    Assessment & Plan    1.  Paroxysmal atrial fibrillation: Patient recently hospitalized in the setting of urinary  tract infection with encephalopathy and subsequently development of hypoxia and respiratory distress after removing BiPAP.  In that setting, she developed rapid atrial fibrillation which was managed with amiodarone bolus and infusion and subsequent conversion to sinus rhythm within 2 hours.  We did not initiate oral anticoagulation in the setting of chronic iron deficiency anemia requiring outpatient iron and blood transfusions.  Zio monitoring in the outpatient setting showed no recurrent atrial fibrillation and she denies any symptoms today.  She is bradycardic at 53 bpm today and has had some orthostasis.  Reducing bisoprolol to 2.5 mg daily and we discussed that we may need to discontinue this altogether.  Discussed the role of  amiodarone and preventing future paroxysmal atrial fibrillation and agreed to continue for the time being, as she is likely high risk for recurrent atrial fibrillation in the future with her history of lung disease and HFpEF.  2.  Chronic HFpEF/chronic lower extremity edema/lymphedema: Echo during hospitalization showed EF of 60 to 65% with mild MR and mild aortic stenosis.  She has chronic lower extremity edema, which she historically manages with compression socks and low-dose torsemide therapy.  She did not wear compression socks today, and she has 1-2+ edema up to her calves.  Mild erythema is also noted though she thinks that is just because she is more swollen.  Her legs are cool to touch and there is no drainage.  She recently had a basic metabolic panel on May 28 with a creatinine of 1.62 and BUN of 56.  I agree with her primary care provider that with ongoing swelling, would prefer to continue low-dose torsemide at the current dose, and follow-up lab work next week, as is planned.  Ensure adequate oral hydration.  3.  Orthostatic hypotension: Blood pressure has been trending low at home and she notes orthostatic lightheadedness.  Reducing bisoprolol to 2.5 mg daily and we discussed that this may need to be discontinued altogether for ongoing symptoms.  4.  Precordial chest pain/tightness: Prior to hospitalization, patient had intermittent chest tightness, though has not had any since hospital discharge.  Symptoms previously were occurring at rest, lasting a variable amount of time, and resolving spontaneously.  Notably during her hospitalization and episode of rapid atrial fibrillation, she had a normal troponin with normal LV function by echo, which is reassuring.  Three-vessel coronary atherosclerosis/calcification was previously noted on CT scan in January 2024.  We discussed options for evaluation and after discussion with patient and family, mutually agreed to hold off on ischemic testing at this  time but would have a low threshold to proceed with [facility] if patient has recurrent chest discomfort.  She is not on antiplatelet therapy in the setting of iron deficiency anemia.  Continue statin therapy.  5.  COPD: On home O2.  Faint expiratory wheezing on examination today.  Inhaler management per primary care.  6.  Stage III chronic kidney disease/acute kidney injury: Creatinine higher at 1.61 on May 28.  As above, will extremity edema, will continue current dose of torsemide.  Ensure adequate hydration.  Plan for follow-up labs next week.  7.  Type 2 diabetes mellitus: A1c 5.3 in January.  She is on metformin.  8.  Hypothyroidism: TSH normal at 0.899 on levothyroxine therapy in April.  Will plan to follow-up TSH in 1 month in the setting of amiodarone therapy.  9.  HL:  on statin.  LDL 62.  10.  Disposition: Follow-up in 1 month.  Will need amiodarone surveillance labs  at that time if not completed beforehand.   Nicolasa Ducking, NP 08/06/2022, 4:42 PM

## 2022-08-09 ENCOUNTER — Encounter: Payer: Self-pay | Admitting: Nurse Practitioner

## 2022-08-09 ENCOUNTER — Ambulatory Visit
Admission: EM | Admit: 2022-08-09 | Discharge: 2022-08-09 | Disposition: A | Discharge status: Home / Self Care | Payer: Medicare Other | Attending: Emergency Medicine | Admitting: Emergency Medicine

## 2022-08-09 ENCOUNTER — Inpatient Hospital Stay (HOSPITAL_BASED_OUTPATIENT_CLINIC_OR_DEPARTMENT_OTHER): Payer: Medicare Other | Admitting: Nurse Practitioner

## 2022-08-09 ENCOUNTER — Inpatient Hospital Stay: Payer: Medicare Other | Attending: Internal Medicine

## 2022-08-09 ENCOUNTER — Inpatient Hospital Stay: Payer: Medicare Other

## 2022-08-09 VITALS — BP 112/82

## 2022-08-09 VITALS — BP 109/82 | HR 57 | Temp 97.2°F | Wt 231.8 lb

## 2022-08-09 DIAGNOSIS — I480 Paroxysmal atrial fibrillation: Secondary | ICD-10-CM | POA: Diagnosis not present

## 2022-08-09 DIAGNOSIS — I130 Hypertensive heart and chronic kidney disease with heart failure and stage 1 through stage 4 chronic kidney disease, or unspecified chronic kidney disease: Secondary | ICD-10-CM | POA: Diagnosis not present

## 2022-08-09 DIAGNOSIS — N183 Chronic kidney disease, stage 3 unspecified: Secondary | ICD-10-CM

## 2022-08-09 DIAGNOSIS — Z79899 Other long term (current) drug therapy: Secondary | ICD-10-CM | POA: Diagnosis not present

## 2022-08-09 DIAGNOSIS — G9341 Metabolic encephalopathy: Secondary | ICD-10-CM | POA: Insufficient documentation

## 2022-08-09 DIAGNOSIS — S91209A Unspecified open wound of unspecified toe(s) with damage to nail, initial encounter: Secondary | ICD-10-CM | POA: Diagnosis not present

## 2022-08-09 DIAGNOSIS — Z79890 Hormone replacement therapy (postmenopausal): Secondary | ICD-10-CM | POA: Insufficient documentation

## 2022-08-09 DIAGNOSIS — J9610 Chronic respiratory failure, unspecified whether with hypoxia or hypercapnia: Secondary | ICD-10-CM | POA: Insufficient documentation

## 2022-08-09 DIAGNOSIS — F32A Depression, unspecified: Secondary | ICD-10-CM | POA: Diagnosis not present

## 2022-08-09 DIAGNOSIS — E1140 Type 2 diabetes mellitus with diabetic neuropathy, unspecified: Secondary | ICD-10-CM | POA: Diagnosis not present

## 2022-08-09 DIAGNOSIS — Z7984 Long term (current) use of oral hypoglycemic drugs: Secondary | ICD-10-CM | POA: Insufficient documentation

## 2022-08-09 DIAGNOSIS — I350 Nonrheumatic aortic (valve) stenosis: Secondary | ICD-10-CM | POA: Diagnosis not present

## 2022-08-09 DIAGNOSIS — I5032 Chronic diastolic (congestive) heart failure: Secondary | ICD-10-CM | POA: Diagnosis not present

## 2022-08-09 DIAGNOSIS — D509 Iron deficiency anemia, unspecified: Secondary | ICD-10-CM

## 2022-08-09 DIAGNOSIS — D649 Anemia, unspecified: Secondary | ICD-10-CM

## 2022-08-09 DIAGNOSIS — S99922A Unspecified injury of left foot, initial encounter: Secondary | ICD-10-CM

## 2022-08-09 DIAGNOSIS — D500 Iron deficiency anemia secondary to blood loss (chronic): Secondary | ICD-10-CM

## 2022-08-09 DIAGNOSIS — Z85118 Personal history of other malignant neoplasm of bronchus and lung: Secondary | ICD-10-CM

## 2022-08-09 DIAGNOSIS — E785 Hyperlipidemia, unspecified: Secondary | ICD-10-CM | POA: Insufficient documentation

## 2022-08-09 DIAGNOSIS — J449 Chronic obstructive pulmonary disease, unspecified: Secondary | ICD-10-CM | POA: Diagnosis not present

## 2022-08-09 DIAGNOSIS — E039 Hypothyroidism, unspecified: Secondary | ICD-10-CM | POA: Insufficient documentation

## 2022-08-09 LAB — CBC WITH DIFFERENTIAL/PLATELET
Abs Immature Granulocytes: 0.05 10*3/uL (ref 0.00–0.07)
Basophils Absolute: 0 10*3/uL (ref 0.0–0.1)
Basophils Relative: 0 %
Eosinophils Absolute: 0 10*3/uL (ref 0.0–0.5)
Eosinophils Relative: 1 %
HCT: 25.1 % — ABNORMAL LOW (ref 36.0–46.0)
Hemoglobin: 7.4 g/dL — ABNORMAL LOW (ref 12.0–15.0)
Immature Granulocytes: 1 %
Lymphocytes Relative: 17 %
Lymphs Abs: 1.1 10*3/uL (ref 0.7–4.0)
MCH: 29.6 pg (ref 26.0–34.0)
MCHC: 29.5 g/dL — ABNORMAL LOW (ref 30.0–36.0)
MCV: 100.4 fL — ABNORMAL HIGH (ref 80.0–100.0)
Monocytes Absolute: 0.5 10*3/uL (ref 0.1–1.0)
Monocytes Relative: 7 %
Neutro Abs: 4.7 10*3/uL (ref 1.7–7.7)
Neutrophils Relative %: 74 %
Platelets: 193 10*3/uL (ref 150–400)
RBC: 2.5 MIL/uL — ABNORMAL LOW (ref 3.87–5.11)
RDW: 18.6 % — ABNORMAL HIGH (ref 11.5–15.5)
WBC: 6.4 10*3/uL (ref 4.0–10.5)
nRBC: 0 % (ref 0.0–0.2)

## 2022-08-09 LAB — COMPREHENSIVE METABOLIC PANEL
ALT: 15 U/L (ref 0–44)
AST: 15 U/L (ref 15–41)
Albumin: 3.2 g/dL — ABNORMAL LOW (ref 3.5–5.0)
Alkaline Phosphatase: 73 U/L (ref 38–126)
Anion gap: 8 (ref 5–15)
BUN: 26 mg/dL — ABNORMAL HIGH (ref 8–23)
CO2: 31 mmol/L (ref 22–32)
Calcium: 8.2 mg/dL — ABNORMAL LOW (ref 8.9–10.3)
Chloride: 102 mmol/L (ref 98–111)
Creatinine, Ser: 1.22 mg/dL — ABNORMAL HIGH (ref 0.44–1.00)
GFR, Estimated: 44 mL/min — ABNORMAL LOW (ref >60)
Glucose, Bld: 108 mg/dL — ABNORMAL HIGH (ref 70–99)
Potassium: 4.6 mmol/L (ref 3.5–5.1)
Sodium: 141 mmol/L (ref 135–145)
Total Bilirubin: 0.3 mg/dL (ref 0.3–1.2)
Total Protein: 6.4 g/dL — ABNORMAL LOW (ref 6.5–8.1)

## 2022-08-09 LAB — IRON AND TIBC
Iron: 37 ug/dL (ref 28–170)
Saturation Ratios: 10 % — ABNORMAL LOW (ref 10.4–31.8)
TIBC: 372 ug/dL (ref 250–450)
UIBC: 335 ug/dL

## 2022-08-09 LAB — VITAMIN B12: Vitamin B-12: 1814 pg/mL — ABNORMAL HIGH (ref 180–914)

## 2022-08-09 LAB — FOLATE: Folate: 11.4 ng/mL (ref >5.9)

## 2022-08-09 LAB — FERRITIN: Ferritin: 17 ng/mL (ref 11–307)

## 2022-08-09 MED ORDER — SODIUM CHLORIDE 0.9 % IV SOLN
Freq: Once | INTRAVENOUS | Status: AC
  Filled 2022-08-09: qty 250

## 2022-08-09 MED ORDER — DOXYCYCLINE HYCLATE 100 MG PO CAPS: 100.0000 mg | ORAL_CAPSULE | Freq: Two times a day (BID) | ORAL | 0 refills | Status: DC

## 2022-08-09 MED ORDER — METHYLPREDNISOLONE SODIUM SUCC 125 MG IJ SOLR
40.0000 mg | Freq: Once | INTRAMUSCULAR | Status: AC
  Administered 2022-08-09: 40 mg via INTRAVENOUS
  Filled 2022-08-09: qty 2

## 2022-08-09 MED ORDER — SODIUM CHLORIDE 0.9 % IV SOLN
200.0000 mg | Freq: Once | INTRAVENOUS | Status: AC
  Administered 2022-08-09: 200 mg via INTRAVENOUS
  Filled 2022-08-09: qty 200

## 2022-08-09 NOTE — Progress Notes (Signed)
[facility] CONSULT NOTE  Patient Care Team: Joaquim Nam, MD as PCP - General Mariah Milling, Tollie Pizza, MD as PCP - Cardiology (Cardiology) Antonieta Iba, MD as Consulting Physician (Cardiology) Oneta Rack, NP as Nurse Practitioner (Adult Health Nurse Practitioner) Earna Coder, MD as Consulting Physician (Oncology)  CHIEF COMPLAINTS/PURPOSE OF CONSULTATION: ANEMIA  HEMATOLOGY HISTORY:  # CHRONIC INTERMITTENT ANEMIA  [since 2010]- Hb SEP 2022- 8; previously on PO Iron- ; WBC/platelets- Normal; MCV- 90s. EGD- > 15 years; /Colonoscopy:4 years- [Dr.Jacob;; in [place]] capsule-? Bone marrow Biopsy-none; OCT 2022- SEVERE IRON DEFICIENCY; myeloma work-up/hemolysis work-up negative; JAN 2023-CT scan duodenal thickening-recommend GI evaluation  #   AUG 2021- RIGHT LUNG stage I non-small cell lung cancer-  s/p SBRT   [Dr.Moody; [place]]; NOV 2022- CT chest-no recurrent disease.  # COPD on 2.5 lit/day [Dr.Sood]; diastolic CHF/ OCT 2022-Low BP [needing to come off losartan- Dr.Gollan]; peripheral neuropathy.  HISTORY OF PRESENTING ILLNESS: Alone; 2.5 Lit/ O2 24 x7. In wheel chair. Daughter, [name], on phone. Caregiver, Elease Hashimoto, in lobby.   Alexandria [name] 87 y.o.  female with chronic respiratory failure on oxygen and severe anemia secondary to iron deficiency of unclear etiology, s/p venofer and transfusion returns to clinic for sooner evaluation. At last visit on 4/24, patient was obtunded and sent to hospital where she was found to hypoxic with leukocytosis, with UTI and A fib with rvr. She received bipap, steroids, renebulizers, antibiotics, amiodarone, beta blocks. She was felt not to be a candidate for anticoagulation. She was discharged to rehab center on 5/1 and is now at home. PT and OT are coming out for services starting tomorrow. She caught her toenail on something on way into clinic and it has bled.   Review of Systems  Constitutional:  Positive for  malaise/fatigue. Negative for chills, fever and weight loss.  HENT:  Negative for nosebleeds.   Respiratory:  Negative for cough, hemoptysis, shortness of breath and wheezing.   Cardiovascular:  Negative for chest pain, palpitations and leg swelling.  Gastrointestinal:  Negative for abdominal pain, blood in stool, constipation, diarrhea, melena, nausea and vomiting.  Genitourinary:  Negative for dysuria, hematuria and urgency.  Musculoskeletal:  Positive for back pain and joint pain. Negative for falls and myalgias.  Skin:  Negative for itching and rash.  Neurological:  Positive for weakness. Negative for dizziness, tingling, sensory change, loss of consciousness and headaches.  Endo/Heme/Allergies:  Negative for environmental allergies. Does not bruise/bleed easily.  Psychiatric/Behavioral:  Negative for depression. The patient is not nervous/anxious and does not have insomnia.     MEDICAL HISTORY:  Past Medical History:  Diagnosis Date   Allergy, unspecified not elsewhere classified    Anxiety    Aortic stenosis, mild 03/30/2022   a. TTE 03/29/2022: EF 55-60, no RWMA, GR 1 DD, normal RVSF, mild aortic stenosis (mean 10, V-max 212 cm/s, DI 0.70)   Cataract    Chronic diastolic congestive heart failure (HCC)    a. 06/2022 Echo: EF 60-65%, no rwma, nl RV fxn, mild MR, mild AS.   COPD (chronic obstructive pulmonary disease) (HCC) 03/08/1998   PFTs 12/12/2002 FEV 1 1.42 (64%) ratio 58 with no better after B2 and DLCO75%; PFTs 11/20/09 FEV1 1.50 (73%) ratio 50 no better after B2 with DLCO 62%; Hfa 50% 11/20/2009 >75%, 01/13/10 p coaching   Coronary artery calcification seen on CT scan    a. 2012 Neg stress test.   Depression 12/07/1998   Diabetes mellitus type II 02/05/2006  Dr. Elvera Lennox with endo   Disorders of bursae and tendons in shoulder region, unspecified    Rotator cuff syndrome, right   E. coli bacteremia    Esophagitis    GERD (gastroesophageal reflux disease)    History of UTI     Hyperlipidemia 10/04/2000   Hypertension 03/08/1992   Hypothyroidism 03/08/1968   Iron deficiency anemia    Lung nodule    radiation starts 10-08-2019   Malignant neoplasm of right upper lobe of lung (HCC) 07/24/2019   Obesity    NOS   OSA (obstructive sleep apnea)    PSG 01/27/10 AHI 13, pt does not know CPAP settings   PAF (paroxysmal atrial fibrillation) (HCC)    a. 06/2022 in setting of UTI/encephalopathy-->converted on amio; b. CHA2DS2VASc = 6-->No OAC 2/2 chronic anemia/fall risk; c. 06/2022 Zio: Predominantly sinus bradycardia at 54 (3-158)'s.  1 brief run of SVT.  2.2% PVC burden.   Peripheral neuropathy    Likely due to DM per Dr. Tresa Endo hernia     SURGICAL HISTORY: Past Surgical History:  Procedure Laterality Date   ABD U/S  03/19/1999   Nml x2 foci in liver   ADENOSINE MYOVIEW  06/02/2007   Nml   CARDIOLITE PERSANTINE  08/24/2000   Nml   CAROTID U/S  08/24/2000   1-39% ICA stenosis   CAROTID U/S  06/02/2007   No apprec change    CARPAL TUNNEL RELEASE  12/1997   Right   CESAREAN SECTION     x2 Breech/ repeat   CHOLECYSTECTOMY  1997   COLONOSCOPY WITH PROPOFOL N/A 02/12/2016   Procedure: COLONOSCOPY WITH PROPOFOL;  Surgeon: Rachael Fee, MD;  Location: [facility];  Service: Endoscopy;  Laterality: N/A;   CT ABD W & PELVIS WO/W CM     Abd hemangiomas of liver, 1 cm R renal cyst   DENTAL SURGERY  2016   Implants   DEXA  07/03/2003   Nml   ESOPHAGOGASTRODUODENOSCOPY  12/05/1997   Nml (due to hoarseness)   ESOPHAGOGASTRODUODENOSCOPY (EGD) WITH PROPOFOL N/A 02/12/2016   Procedure: ESOPHAGOGASTRODUODENOSCOPY (EGD) WITH PROPOFOL;  Surgeon: Rachael Fee, MD;  Location: [facility];  Service: Endoscopy;  Laterality: N/A;   GALLBLADDER SURGERY     HERNIA REPAIR  01/24/2009   Lap Ventr w/ Lysis of adhesions (Dr. Dwain Sarna)   knee arthroscopic surgery  years ago   right   ROTATOR CUFF REPAIR  1984   Right, [place]   SHOULDER OPEN ROTATOR CUFF REPAIR   02/08/2012   Procedure: ROTATOR CUFF REPAIR SHOULDER OPEN;  Surgeon: Drucilla Schmidt, MD;  Location: [facility];  Service: Orthopedics;  Laterality: Left;  Left Shoulder Open Anterior Acrominectomy Rotator Cuff Repair Open Distal Clavicle Resection ,tissue mend graft, and repair of biceps tendon   THUMB RELEASE  12/1997   Right   TONSILLECTOMY     TOTAL ABDOMINAL HYSTERECTOMY  1985   Due to dysmennorhea   US ECHOCARDIOGRAPHY  06/02/2007    SOCIAL HISTORY: Social History   Socioeconomic History   Marital status: Widowed    Spouse name: Not on file   Number of children: 2   Years of education: Not on file   Highest education level: Not on file  Occupational History   Occupation: Retired    Associate Professor: RETIRED  Tobacco Use   Smoking status: Former    Packs/day: 2.00    Years: 50.00    Additional pack years: 0.00    Total pack years: 100.00  Types: Cigarettes    Quit date: 02/05/2005    Years since quitting: 17.5   Smokeless tobacco: Never  Vaping Use   Vaping Use: Never used  Substance and Sexual Activity   Alcohol use: Yes    Comment: occasionally   Drug use: Never   Sexual activity: Not Currently  Other Topics Concern   Not on file  Social History Narrative   Widowed 2023, 2 children; Enjoys painting      Quit smoking in 2007; no alcohol; lives in [place]; [name]/d lives in [place]. Had own business. Dont drive- sec to neuropathy.    Social Determinants of Health   Financial Resource Strain: Not on file  Food Insecurity: No Food Insecurity (06/30/2022)   Hunger Vital Sign    Worried About Running Out of Food in the Last Year: Never true    Ran Out of Food in the Last Year: Never true  Transportation Needs: No Transportation Needs (06/30/2022)   PRAPARE - Administrator, Civil Service (Medical): No    Lack of Transportation (Non-Medical): No  Physical Activity: Not on file  Stress: Not on file  Social Connections: Not on file  Intimate Partner  Violence: Not At Risk (06/30/2022)   Humiliation, Afraid, Rape, and Kick questionnaire    Fear of Current or Ex-Partner: No    Emotionally Abused: No    Physically Abused: No    Sexually Abused: No    FAMILY HISTORY: Family History  Problem Relation Age of Onset   Heart disease Mother    Thyroid disease Mother    Emphysema Father        One lung   Cystic fibrosis Sister    Hyperthyroidism Sister    Osteoarthritis Brother    Hyperthyroidism Brother    Esophageal cancer Neg Hx    Cancer Neg Hx        Head or neck   Colon cancer Neg Hx    Stomach cancer Neg Hx    Breast cancer Neg Hx     ALLERGIES:  is allergic to antihistamines, diphenhydramine-type; incruse ellipta [umeclidinium bromide]; clarithromycin; codeine; diphenhydramine; pregabalin; sulfonamide derivatives; tiotropium bromide monohydrate; umeclidinium; and vraylar [cariprazine].  MEDICATIONS:  Current Outpatient Medications  Medication Sig Dispense Refill   acetaminophen (TYLENOL) 325 MG tablet Take 2 tablets (650 mg total) by mouth every 6 (six) hours as needed for mild pain, headache, fever or moderate pain.     albuterol (PROAIR HFA) 108 (90 Base) MCG/ACT inhaler Inhale 2 puffs into the lungs every 6 (six) hours as needed for wheezing or shortness of breath. INHALE 2 PUFFS INTO THE LUNGS EVERY 6 HOURS AS NEEDED FOR WHEEZING OR SHORTNESS OF BREATH 3 Inhaler 3   amiodarone (PACERONE) 200 MG tablet Take 1 tablet (200 mg total) by mouth daily. 90 tablet 1   arformoterol (BROVANA) 15 MCG/2ML NEBU Take 2 mLs (15 mcg total) by nebulization at bedtime. 120 mL 6   bisoprolol (ZEBETA) 5 MG tablet Take 0.5 tablets (2.5 mg total) by mouth daily. 45 tablet 1   budesonide (PULMICORT) 0.5 MG/2ML nebulizer solution Take 2 mLs (0.5 mg total) by nebulization in the morning and at bedtime. 360 mL 3   busPIRone (BUSPAR) 15 MG tablet Take 15 mg by mouth 3 (three) times daily.     Cholecalciferol (VITAMIN D) 50 MCG (2000 UT) tablet Take 1  tablet (2,000 Units total) by mouth daily.     Coenzyme Q-10 200 MG CAPS Take 200 mg by mouth  daily.     Cyanocobalamin (B-12) 5000 MCG CAPS Take 5,000 mcg by mouth daily.     esomeprazole (NEXIUM) 40 MG capsule Take 1 capsule (40 mg total) by mouth daily at 12 noon. 30 capsule 11   ezetimibe (ZETIA) 10 MG tablet TAKE 1 TABLET BY MOUTH EVERYDAY AT BEDTIME 90 tablet 2   ferrous sulfate 325 (65 FE) MG tablet TAKE 1 TABLET BY MOUTH EVERY DAY 90 tablet 3   FLUoxetine (PROZAC) 20 MG tablet Take 20 mg by mouth 2 (two) times daily.     glucose blood (ONETOUCH VERIO) test strip USE AS INSTRUCTED TO CHECK SUGAR 1 TIME DAILY 100 strip 3   guaiFENesin (MUCINEX) 600 MG 12 hr tablet Take 2 tablets (1,200 mg total) by mouth 2 (two) times daily as needed for cough or to loosen phlegm.     levothyroxine (SYNTHROID) 125 MCG tablet Take 1 tablet (125 mcg total) by mouth daily. 90 tablet 3   magnesium 30 MG tablet Take 30 mg by mouth 2 (two) times daily.     melatonin 5 MG TABS Take 1 tablet (5 mg total) by mouth at bedtime.  0   metFORMIN (GLUCOPHAGE) 500 MG tablet Take 1 tablet (500 mg total) by mouth every evening. 90 tablet 3   nitroGLYCERIN (NITROSTAT) 0.4 MG SL tablet Place 1 tablet (0.4 mg total) under the tongue every 5 (five) minutes as needed for chest pain. 25 tablet 3   nystatin (MYCOSTATIN) 100000 UNIT/ML suspension Take 5 mLs (500,000 Units total) by mouth 4 (four) times daily. 120 mL 0   OneTouch Delica Lancets 33G MISC Use 1x a day 100 each 3   OXYGEN Inhale into the lungs. 2lpm     rosuvastatin (CRESTOR) 10 MG tablet TAKE 1 TABLET BY MOUTH EVERY OTHER DAY 45 tablet 2   Spacer/Aero Chamber Mouthpiece MISC Use with  inhaler as needed.  J44.9 1 each 0   torsemide (DEMADEX) 20 MG tablet Take 20 mg by mouth daily.     Zinc Oxide (TRIPLE PASTE) 12.8 % ointment Apply 1 Application topically as needed for irritation. Every shift.     No current facility-administered medications for this visit.     PHYSICAL EXAMINATION: Vitals:   08/09/22 1330  BP: 109/82  Pulse: (!) 57  Temp: (!) 97.2 F (36.2 C)  SpO2: 96%   Filed Weights   08/09/22 1330  Weight: 231 lb 12.8 oz (105.1 kg)   Physical Exam Vitals reviewed.  Constitutional:      Appearance: She is morbidly obese. She is not ill-appearing.     Interventions: Nasal cannula in place.     Comments: Unaccompanied. Daughter on phone.   Cardiovascular:     Rate and Rhythm: Normal rate.  Pulmonary:     Effort: No respiratory distress.     Comments: Decreased breath sounds bilaterally.  Scattered wheezing. On oxygen via .  Abdominal:     General: There is no distension.     Palpations: Abdomen is soft.     Tenderness: There is no abdominal tenderness.  Skin:    Coloration: Skin is pale.  Neurological:     Mental Status: She is alert and oriented to person, place, and time.     Motor: No weakness.  Psychiatric:        Mood and Affect: Mood normal.        Behavior: Behavior normal.     LABORATORY DATA:  I have reviewed the data as listed Lab Results  Component Value Date   WBC 6.4 08/09/2022   HGB 7.4 (L) 08/09/2022   HCT 25.1 (L) 08/09/2022   MCV 100.4 (H) 08/09/2022   PLT 193 08/09/2022   Recent Labs    06/29/22 1302 06/29/22 1437 06/30/22 0631 07/06/22 1048 07/07/22 0718 07/08/22 0000 08/03/22 1234 08/09/22 1305  NA 134* 136   < > 143 144 143 142 141  K 4.6 4.6   < > 3.9 3.9 3.9 3.8 4.6  CL 94* 95*   < > 97* 100 101 94* 102  CO2 33* 38*   < > 34* 36* 39* 40* 31  GLUCOSE 174* 130*   < > 182* 104*  --  102* 108*  BUN 33* 33*   < > 41* 46* 40* 56* 26*  CREATININE 1.38* 1.36*   < > 1.22* 1.25* 1.2* 1.62* 1.22*  CALCIUM 7.8* 7.8*   < > 8.7* 8.6* 8.7 8.8 8.2*  GFRNONAA 38* 38*   < > 44* 43*  --   --  44*  PROT 6.5 6.3*  --   --   --   --   --  6.4*  ALBUMIN 2.9* 2.9*  --   --   --  3.2*  --  3.2*  AST 16 16  --   --   --  14  --  15  ALT 15 16  --   --   --  22  --  15  ALKPHOS 104 100  --   --    --  78  --  73  BILITOT 0.5 0.8  --   --   --   --   --  0.3   < > = values in this interval not displayed.   Iron/TIBC/Ferritin/ %Sat    Component Value Date/Time   IRON 37 08/09/2022 1305   IRON 52 07/22/2022 0000   TIBC 372 08/09/2022 1305   FERRITIN 17 08/09/2022 1305   IRONPCTSAT 10 (L) 08/09/2022 1305     No results found.   Assessment & Plan:   Symptomatic Anemia- 12/2020 ferritin 7. Symptomatic and chronic intermittent since 2010. S/p IV Venofer. April 2024, anemia worsening despite diron infusions. Iron sat 10%, ferritin 26. Question other etiologies including blood loss vs primary bone marrow disorders- additional workup was declined and she elected for symptomatic care. Hemoglobin baseline around 9-10. Dropped to 8.5 in February 2024 --> 7.2 (06/2022) -->> 6.4 (06/23/22). She received 2 units of pRBCs-- Hmg 7.7. Today, 7.4. Worse. Hold transfusion. Plan for IV venofer. If hemoglobin < 7, plan for transfusion.   Iron Deficiency Anemia- increased iron requirement with drops in hemoglobin despite iron and previous transfusions. Hemoglobin was 8.8 last week and today, has dropped to 7.4. Concern for bleeding as discussed below. Plan for venofer today with venofer twice a week for total of 5 infusions. Will plan for monthly lab checks as she drops quickly. She has tolerated venofer well but previously experienced possible allergic reaction and has been premedicated with solumedrol 40 mg since. At next visit, discuss with daughter decrease in dose. If she is not a candidate for interventions, she will likely need ongoing IV iron and transfusional support.  Etiology of Iron Deficiency- unclear- CT 03/18/21 revealed mild wall thickening of the proximal duodenum, at least in part d/t under distension, possibly reflective of PUD/duodenitis, s/p eval with Dr Christella Hartigan in [place]/ GI in June 2023. Per patient, she was not a candidate for upper and lower endoscopies. Last EGD  02/2016 revealed small  hiatal hernia. Colonoscopy with sigmoid diverticulosis, lipoma of sigmoid colon, internal hemorrhoids. Given ongoing drops in her hemoglobin and iron stores despite replacement, I expressed concern for blood loss. Patient denies blood in stool. I've recommended she be evaluate by her gastroenterologist (last saw Hyacinth Meeker, [place] 08/17/21). For now, we will continue supportive care.  CKD- stage III. GFR 44. Discussed the etiology of anemia in chronic kidney disease. Likely contributing to anemia as well. Has not yet started EPO and we discussed that plan would be to optimize iron stores and if hemoglobin persistently < 10, start EPO. Patient and daughter agree.  B12 deficiency- levels pending today Stage I Lung Cancer- Aug 2021 right lung, NSCLC. S/p SBRT with Dr. Mitzi Hansen in [place]. 03/2022 CT negative for recurrent disease.  COPD & Chronic respiratory failure & Diastolic chf- stable.  Metabolic encephalopathy- previously obtunded in clinic. Transferred to ER and found to have leukocytosis and hypoxia. Secondary to UTI. Discharged to rehab, now home. Lives alone but increased care needs.   Toenail avulsion & diabetic toenails- referred to urgent care for toenail removal. Referral to podiatry sent for diabetic foot care.   Disposition:  Venofer today 2-4 days - venofer Next week- venofer x 2  1 week- lab (H&H) 2 weeks- lab (H&H), & venofer 3 weeks- lab (H&H) 4 weeks- lab (cbc, b12), see Dr Donneta Romberg, +/- venofer- la  No problem-specific Assessment & Plan notes found for this encounter.  All questions were answered. The patient knows to call the clinic with any problems, questions or concerns.   Alinda Dooms, NP 08/09/2022   CC: Hyacinth Meeker, PA, Dr Donneta Romberg, Dr. Para March

## 2022-08-09 NOTE — ED Provider Notes (Signed)
Alexandria [name]    CSN: [id number] Arrival date & time: 08/09/22  1614      History   Chief Complaint Chief Complaint  Patient presents with   Foot Pain    HPI Alexandria [name] is a 87 y.o. female.   Patient presents for evaluation of a left great toenail injury that occurred within the hour.  Toe was jammed causing the toenail to lift.  Was recommended to go to podiatry but patient chose to come to urgent care for evaluation.  History of diabetes.  Has not attempted treatment.  Past Medical History:  Diagnosis Date   Allergy, unspecified not elsewhere classified    Anxiety    Aortic stenosis, mild 03/30/2022   a. TTE 03/29/2022: EF 55-60, no RWMA, GR 1 DD, normal RVSF, mild aortic stenosis (mean 10, V-max 212 cm/s, DI 0.70)   Cataract    Chronic diastolic congestive heart failure (HCC)    a. 06/2022 Echo: EF 60-65%, no rwma, nl RV fxn, mild MR, mild AS.   COPD (chronic obstructive pulmonary disease) (HCC) 03/08/1998   PFTs 12/12/2002 FEV 1 1.42 (64%) ratio 58 with no better after B2 and DLCO75%; PFTs 11/20/09 FEV1 1.50 (73%) ratio 50 no better after B2 with DLCO 62%; Hfa 50% 11/20/2009 >75%, 01/13/10 p coaching   Coronary artery calcification seen on CT scan    a. 2012 Neg stress test.   Depression 12/07/1998   Diabetes mellitus type II 02/05/2006   Dr. Elvera Lennox with endo   Disorders of bursae and tendons in shoulder region, unspecified    Rotator cuff syndrome, right   E. coli bacteremia    Esophagitis    GERD (gastroesophageal reflux disease)    History of UTI    Hyperlipidemia 10/04/2000   Hypertension 03/08/1992   Hypothyroidism 03/08/1968   Iron deficiency anemia    Lung nodule    radiation starts 10-08-2019   Malignant neoplasm of right upper lobe of lung (HCC) 07/24/2019   Obesity    NOS   OSA (obstructive sleep apnea)    PSG 01/27/10 AHI 13, pt does not know CPAP settings   PAF (paroxysmal atrial fibrillation) (HCC)    a. 06/2022 in setting of  UTI/encephalopathy-->converted on amio; b. CHA2DS2VASc = 6-->No OAC 2/2 chronic anemia/fall risk; c. 06/2022 Zio: Predominantly sinus bradycardia at 54 (3-158)'s.  1 brief run of SVT.  2.2% PVC burden.   Peripheral neuropathy    Likely due to DM per Dr. Tresa Endo hernia     Patient Active Problem List   Diagnosis Date Noted   Neurogenic orthostatic hypotension (HCC) 07/13/2022   Non-pressure chronic ulcer of other part of right lower leg with other specified severity (HCC) 07/13/2022   Atherosclerotic heart disease of native coronary artery with other forms of angina pectoris (HCC) 07/13/2022   Paroxysmal atrial fibrillation (HCC) 07/02/2022   Acute on chronic respiratory failure with hypoxia and hypercapnia (HCC) 06/29/2022   Acute encephalopathy 06/29/2022   Aortic stenosis, mild 03/30/2022   PVC's (premature ventricular contractions) 02/23/2022   Skin lesion 08/12/2021   Leg swelling 06/08/2021   Grief 05/27/2021   H/O laceration of skin 05/27/2021   PND (post-nasal drip) 04/27/2021   Wheezing 04/27/2021   Acute cough 04/27/2021   Physical deconditioning 01/30/2020   Healthcare maintenance 01/30/2020   PAD (peripheral artery disease) (HCC) 01/27/2020   Sore throat 11/21/2019   GERD (gastroesophageal reflux disease) 11/21/2019   CAD (coronary artery disease) 07/24/2019   Malignant neoplasm  of right upper lobe of lung (HCC) 07/24/2019   Vitamin D deficiency 06/29/2018   Hyperkalemia 06/28/2018   Dysuria 01/01/2018   Abnormal findings on diagnostic imaging of lung 12/18/2017   Rhinitis 12/18/2017   Knee laceration, right, subsequent encounter 10/06/2016   Knee pain, acute 10/06/2016   Chronic pain of left knee 09/15/2016   Right foot pain 09/15/2016   Chronic respiratory failure with hypoxia (HCC) 07/14/2016   Diverticulosis of colon without hemorrhage    Hemorrhoids    Hiatal hernia    Type 2 diabetes mellitus with diabetic neuropathy, without long-term current use of  insulin (HCC) 04/04/2015   Left temporal headache 03/14/2015   Vertigo 03/14/2015   Chest tightness 12/04/2014   Cystitis 11/03/2014   Acute on chronic heart failure with preserved ejection fraction (HFpEF) (HCC)    Iron deficiency anemia    Obesity hypoventilation syndrome (HCC)    Chronic diastolic CHF (congestive heart failure) (HCC)    Acute cystitis    Symptomatic anemia    Medicare annual wellness visit, subsequent 06/14/2014   Advance care planning 06/14/2014   RLQ abdominal pain 05/10/2014   COPD exacerbation (HCC) 04/28/2014   Lower extremity edema 09/15/2012   DOE (dyspnea on exertion) 08/24/2012   Shoulder pain 12/17/2011   Abdominal hernia 12/17/2011   Anxiety 09/23/2011   Osteopenia 07/25/2011   Obstructive sleep apnea 02/09/2010   Murmur 02/04/2010   CHEST PAIN 01/13/2010   PALPITATIONS 08/29/2008   ESOPHAGEAL STENOSIS 11/22/2007   DYSPHAGIA 11/08/2007   ESOPHAGITIS 10/11/2007   Edema 07/03/2007   COPD with chronic bronchitis and emphysema (HCC) 05/23/2007   ALLERGY 07/25/2006   NUMBNESS 10/06/2001   Hyperlipemia 10/04/2000   Depression, recurrent (HCC) 03/08/1997   Essential hypertension 03/08/1992   Obesity, Class III, BMI 40-49.9 (morbid obesity) (HCC) 03/08/1989   ROTATOR CUFF SYNDROME, RIGHT 03/08/1982   Hypothyroidism 03/08/1968    Past Surgical History:  Procedure Laterality Date   ABD U/S  03/19/1999   Nml x2 foci in liver   ADENOSINE MYOVIEW  06/02/2007   Nml   CARDIOLITE PERSANTINE  08/24/2000   Nml   CAROTID U/S  08/24/2000   1-39% ICA stenosis   CAROTID U/S  06/02/2007   No apprec change    CARPAL TUNNEL RELEASE  12/1997   Right   CESAREAN SECTION     x2 [place]/ repeat   CHOLECYSTECTOMY  1997   COLONOSCOPY WITH PROPOFOL N/A 02/12/2016   Procedure: COLONOSCOPY WITH PROPOFOL;  Surgeon: Rachael Fee, MD;  Location: [facility];  Service: Endoscopy;  Laterality: N/A;   CT ABD W & PELVIS WO/W CM     Abd hemangiomas of liver, 1 cm R  renal cyst   DENTAL SURGERY  2016   Implants   DEXA  07/03/2003   Nml   ESOPHAGOGASTRODUODENOSCOPY  12/05/1997   Nml (due to hoarseness)   ESOPHAGOGASTRODUODENOSCOPY (EGD) WITH PROPOFOL N/A 02/12/2016   Procedure: ESOPHAGOGASTRODUODENOSCOPY (EGD) WITH PROPOFOL;  Surgeon: Rachael Fee, MD;  Location: [facility];  Service: Endoscopy;  Laterality: N/A;   GALLBLADDER SURGERY     HERNIA REPAIR  01/24/2009   Lap Ventr w/ Lysis of adhesions (Dr. Dwain Sarna)   knee arthroscopic surgery  years ago   right   ROTATOR CUFF REPAIR  1984   Right, [place]   SHOULDER OPEN ROTATOR CUFF REPAIR  02/08/2012   Procedure: ROTATOR CUFF REPAIR SHOULDER OPEN;  Surgeon: Drucilla Schmidt, MD;  Location: [facility];  Service: Orthopedics;  Laterality: Left;  Left Shoulder Open Anterior Acrominectomy Rotator Cuff Repair Open Distal Clavicle Resection ,tissue mend graft, and repair of biceps tendon   THUMB RELEASE  12/1997   Right   TONSILLECTOMY     TOTAL ABDOMINAL HYSTERECTOMY  1985   Due to dysmennorhea   US ECHOCARDIOGRAPHY  06/02/2007    OB History   No obstetric history on file.      Home Medications    Prior to Admission medications   Medication Sig Start Date End Date Taking? Authorizing Provider  doxycycline (VIBRAMYCIN) 100 MG capsule Take 1 capsule (100 mg total) by mouth 2 (two) times daily. 08/09/22  Yes Silas Sedam, Elita Boone, NP  acetaminophen (TYLENOL) 325 MG tablet Take 2 tablets (650 mg total) by mouth every 6 (six) hours as needed for mild pain, headache, fever or moderate pain. 07/07/22   Gillis Santa, MD  albuterol (PROAIR HFA) 108 (90 Base) MCG/ACT inhaler Inhale 2 puffs into the lungs every 6 (six) hours as needed for wheezing or shortness of breath. INHALE 2 PUFFS INTO THE LUNGS EVERY 6 HOURS AS NEEDED FOR WHEEZING OR SHORTNESS OF BREATH 04/19/18   Coralyn Helling, MD  amiodarone (PACERONE) 200 MG tablet Take 1 tablet (200 mg total) by mouth daily. 08/06/22   Creig Hines, NP   arformoterol (BROVANA) 15 MCG/2ML NEBU Take 2 mLs (15 mcg total) by nebulization at bedtime. 12/01/21   Coralyn Helling, MD  bisoprolol (ZEBETA) 5 MG tablet Take 0.5 tablets (2.5 mg total) by mouth daily. 08/06/22   Creig Hines, NP  budesonide (PULMICORT) 0.5 MG/2ML nebulizer solution Take 2 mLs (0.5 mg total) by nebulization in the morning and at bedtime. 11/20/21   Coralyn Helling, MD  busPIRone (BUSPAR) 15 MG tablet Take 15 mg by mouth 3 (three) times daily.    [provider]  Cholecalciferol (VITAMIN D) 50 MCG (2000 UT) tablet Take 1 tablet (2,000 Units total) by mouth daily. 06/27/18   Joaquim Nam, MD  Coenzyme Q-10 200 MG CAPS Take 200 mg by mouth daily.    [provider]  Cyanocobalamin (B-12) 5000 MCG CAPS Take 5,000 mcg by mouth daily.    [provider]  esomeprazole (NEXIUM) 40 MG capsule Take 1 capsule (40 mg total) by mouth daily at 12 noon. 08/17/21   Unk Lightning, PA  ezetimibe (ZETIA) 10 MG tablet TAKE 1 TABLET BY MOUTH EVERYDAY AT BEDTIME 03/18/22   Joaquim Nam, MD  ferrous sulfate 325 (65 FE) MG tablet TAKE 1 TABLET BY MOUTH EVERY DAY 04/05/22   Joaquim Nam, MD  FLUoxetine (PROZAC) 20 MG tablet Take 20 mg by mouth 2 (two) times daily.    [provider]  glucose blood (ONETOUCH VERIO) test strip USE AS INSTRUCTED TO CHECK SUGAR 1 TIME DAILY 03/16/22   Carlus Pavlov, MD  guaiFENesin (MUCINEX) 600 MG 12 hr tablet Take 2 tablets (1,200 mg total) by mouth 2 (two) times daily as needed for cough or to loosen phlegm. 08/25/21   Coralyn Helling, MD  levothyroxine (SYNTHROID) 125 MCG tablet Take 1 tablet (125 mcg total) by mouth daily. 09/11/21   Carlus Pavlov, MD  magnesium 30 MG tablet Take 30 mg by mouth 2 (two) times daily.    [provider]  melatonin 5 MG TABS Take 1 tablet (5 mg total) by mouth at bedtime. 07/07/22 07/07/23  Gillis Santa, MD  metFORMIN (GLUCOPHAGE) 500 MG tablet Take 1 tablet (500 mg total) by  mouth every evening. 03/16/22  Carlus Pavlov, MD  nitroGLYCERIN (NITROSTAT) 0.4 MG SL tablet Place 1 tablet (0.4 mg total) under the tongue every 5 (five) minutes as needed for chest pain. 07/16/21   Joaquim Nam, MD  nystatin (MYCOSTATIN) 100000 UNIT/ML suspension Take 5 mLs (500,000 Units total) by mouth 4 (four) times daily. 05/11/22   Joaquim Nam, MD  OneTouch Delica Lancets 33G MISC Use 1x a day 03/16/22   Carlus Pavlov, MD  OXYGEN Inhale into the lungs. 2lpm    [provider]  rosuvastatin (CRESTOR) 10 MG tablet TAKE 1 TABLET BY MOUTH EVERY OTHER DAY 07/30/22   Joaquim Nam, MD  Spacer/Aero Chamber Mouthpiece MISC Use with  inhaler as needed.  J44.9 09/30/17   Joaquim Nam, MD  torsemide (DEMADEX) 20 MG tablet Take 20 mg by mouth daily.    [provider]  Zinc Oxide (TRIPLE PASTE) 12.8 % ointment Apply 1 Application topically as needed for irritation. Every shift.    [provider]    Family History Family History  Problem Relation Age of Onset   Heart disease Mother    Thyroid disease Mother    Emphysema Father        One lung   Cystic fibrosis Sister    Hyperthyroidism Sister    Osteoarthritis Brother    Hyperthyroidism Brother    Esophageal cancer Neg Hx    Cancer Neg Hx        Head or neck   Colon cancer Neg Hx    Stomach cancer Neg Hx    Breast cancer Neg Hx     Social History Social History   Tobacco Use   Smoking status: Former    Packs/day: 2.00    Years: 50.00    Additional pack years: 0.00    Total pack years: 100.00    Types: Cigarettes    Quit date: 02/05/2005    Years since quitting: 17.5   Smokeless tobacco: Never  Vaping Use   Vaping Use: Never used  Substance Use Topics   Alcohol use: Yes    Comment: occasionally   Drug use: Never     Allergies   Antihistamines, diphenhydramine-type; Incruse ellipta [umeclidinium bromide]; Clarithromycin; Codeine; Diphenhydramine; Pregabalin; Sulfonamide  derivatives; Tiotropium bromide monohydrate; Umeclidinium; and Vraylar [cariprazine]   Review of Systems Review of Systems   Physical Exam Triage Vital Signs ED Triage Vitals  Enc Vitals Group     BP 08/09/22 1653 113/65     Pulse Rate 08/09/22 1653 62     Resp 08/09/22 1653 18     Temp 08/09/22 1653 98.8 F (37.1 C)     Temp Source 08/09/22 1653 Oral     SpO2 08/09/22 1653 94 %     Weight --      Height --      Head Circumference --      Peak Flow --      Pain Score 08/09/22 1713 0     Pain Loc --      Pain Edu? --      Excl. in GC? --    No data found.  Updated Vital Signs BP 113/65 (BP Location: Left Arm)   Pulse 62   Temp 98.8 F (37.1 C) (Oral)   Resp 18   LMP  (LMP Unknown)   SpO2 94%   Visual Acuity Right Eye Distance:   Left Eye Distance:   Bilateral Distance:    Right Eye Near:   Left Eye Near:  Bilateral Near:     Physical Exam Constitutional:      Appearance: Normal appearance.  Eyes:     Extraocular Movements: Extraocular movements intact.  Pulmonary:     Effort: Pulmonary effort is normal.  Feet:     Comments: Partial separation of the nail from the nailbed affecting approximately 50 to 75%, proximal end of the nailbed still intact , no damage present to the nailbed,  Neurological:     Mental Status: She is alert and oriented to person, place, and time. Mental status is at baseline.      UC Treatments / Results  Labs (all labs ordered are listed, but only abnormal results are displayed) Labs Reviewed - No data to display  EKG   Radiology No results found.  Procedures Procedures (including critical care time)  Medications Ordered in UC Medications - No data to display  Initial Impression / Assessment and Plan / UC Course  I have reviewed the triage vital signs and the nursing notes.  Pertinent labs & imaging results that were available during my care of the patient were reviewed by me and considered in my medical decision  making (see chart for details).  Toenail left foot, initial encounter  Since toenail is partially still attached recommended removal by podiatry as patient is diabetic and all care should be done by the specialist, discussed this with patient, cleansed area with chlorhexidine and up applied cushion nonadherent dressing overgrowth affected area to prevent further injury, advised to be worn at all times until evaluated by podiatrist, prescribed doxycycline to prevent infection and recommend over-the-counter analgesics and elevation for additional supportive care, Final Clinical Impressions(s) / UC Diagnoses   Final diagnoses:  Injury of toenail of left foot, initial encounter     Discharge Instructions      On examination the toenail has partially been disconnected from the nailbed however the base of the nail is still intact and therefore I would recommend that she follow-up with podiatry for evaluation for toenail removal  Area has been cleansed and a bandage has been applied here in the office to add stability and to keep area from being hit arsenic, please wear dressing over the toe until evaluated by podiatry  To prevent infection begin doxycycline every morning and every evening for 7 days  For any pain you may take Tylenol every 6 hours as needed  May also elevate foot when sitting and lying for additional comfort   ED Prescriptions     Medication Sig Dispense Auth. Provider   doxycycline (VIBRAMYCIN) 100 MG capsule Take 1 capsule (100 mg total) by mouth 2 (two) times daily. 14 capsule Tobyn Osgood, Elita Boone, NP      PDMP not reviewed this encounter.   Valinda Hoar, [place] 08/09/22 [phone]

## 2022-08-09 NOTE — Discharge Instructions (Signed)
On examination the toenail has partially been disconnected from the nailbed however the base of the nail is still intact and therefore I would recommend that she follow-up with podiatry for evaluation for toenail removal  Area has been cleansed and a bandage has been applied here in the office to add stability and to keep area from being hit arsenic, please wear dressing over the toe until evaluated by podiatry  To prevent infection begin doxycycline every morning and every evening for 7 days  For any pain you may take Tylenol every 6 hours as needed  May also elevate foot when sitting and lying for additional comfort

## 2022-08-09 NOTE — Patient Instructions (Signed)

## 2022-08-10 ENCOUNTER — Encounter: Payer: Self-pay | Admitting: Internal Medicine

## 2022-08-11 ENCOUNTER — Telehealth: Payer: Self-pay

## 2022-08-11 NOTE — Telephone Encounter (Signed)
-----   Message from Unk Lightning, [place] sent at 08/11/2022  8:26 AM EDT ----- Regarding: IDA Can  you get patient an appt with a physician to discuss options for IDA sooner- technically unassigned now as previous Dr. Christella Hartigan patient- so doesn't matter who.  Thanks-JLL ----- Message ----- From: Alinda Dooms, NP Sent: 08/09/2022   3:36 PM EDT To: Unk Lightning, PA  Hi Victorino Dike- you saw this patient in June 2023. Her hemoglobin keeps dropping and persistently iron deficient. I suspect she is having some blood loss. I know she may not be a candidate for much but hoping you might have some input on source. Thanks!  Leotis Shames, NP

## 2022-08-11 NOTE — Telephone Encounter (Signed)
Caller: Patient's daughter, [name]  Concern: Pt went into hospital approx 2 wks after last office visit, in hospital for 8 days, rehab for 30 days, she has been home for 1 wk, her weight has increased significantly, 4-6 lb in the past 3 days. She has been measured for lymphedema pumps over a month ago and not heard anything, pt has some small venous ulcerations.  Location: left leg, right leg  Description:  over the past several wks  Treatments:  Continue elevation, compression, use Zinc ointment on ulcerations  Resolution: Appointment scheduled taking into consideration of pt's other appts. Offered tomorrow, 6/6, but she refused. Offered other earlier appts, but she refused since pt has to go to infusion clinic twice weekly and it's too much to do in one week  Next Appt: Appointment scheduled for 08/31/22 with PA

## 2022-08-11 NOTE — Telephone Encounter (Signed)
Called and spoke with patient's daughter [name]. I offered an appt on 6/7 but patient has several appts that day and will not be able to make it. Patient has been scheduled for a follow up appt with Dr. Tomasa Rand on Friday, 09/03/22 at 11:10 am. I provided [name] with the office address. [name] verbalized understanding and had no concerns at the end of the call.

## 2022-08-12 ENCOUNTER — Telehealth: Payer: Self-pay | Admitting: Cardiovascular Disease

## 2022-08-12 ENCOUNTER — Telehealth: Payer: Self-pay

## 2022-08-12 ENCOUNTER — Other Ambulatory Visit: Payer: Medicare Other

## 2022-08-12 ENCOUNTER — Encounter: Payer: Self-pay | Admitting: Internal Medicine

## 2022-08-12 ENCOUNTER — Ambulatory Visit: Payer: Medicare Other

## 2022-08-12 ENCOUNTER — Ambulatory Visit: Payer: Medicare Other | Admitting: Nurse Practitioner

## 2022-08-12 DIAGNOSIS — G4733 Obstructive sleep apnea (adult) (pediatric): Secondary | ICD-10-CM | POA: Diagnosis not present

## 2022-08-12 DIAGNOSIS — J9621 Acute and chronic respiratory failure with hypoxia: Secondary | ICD-10-CM | POA: Diagnosis not present

## 2022-08-12 DIAGNOSIS — I480 Paroxysmal atrial fibrillation: Secondary | ICD-10-CM | POA: Diagnosis not present

## 2022-08-12 DIAGNOSIS — G903 Multi-system degeneration of the autonomic nervous system: Secondary | ICD-10-CM | POA: Diagnosis not present

## 2022-08-12 DIAGNOSIS — J441 Chronic obstructive pulmonary disease with (acute) exacerbation: Secondary | ICD-10-CM | POA: Diagnosis not present

## 2022-08-12 DIAGNOSIS — E039 Hypothyroidism, unspecified: Secondary | ICD-10-CM | POA: Diagnosis not present

## 2022-08-12 DIAGNOSIS — Z792 Long term (current) use of antibiotics: Secondary | ICD-10-CM | POA: Diagnosis not present

## 2022-08-12 DIAGNOSIS — F419 Anxiety disorder, unspecified: Secondary | ICD-10-CM | POA: Diagnosis not present

## 2022-08-12 DIAGNOSIS — D509 Iron deficiency anemia, unspecified: Secondary | ICD-10-CM | POA: Diagnosis not present

## 2022-08-12 DIAGNOSIS — J4489 Other specified chronic obstructive pulmonary disease: Secondary | ICD-10-CM | POA: Diagnosis not present

## 2022-08-12 DIAGNOSIS — N390 Urinary tract infection, site not specified: Secondary | ICD-10-CM | POA: Diagnosis not present

## 2022-08-12 DIAGNOSIS — I350 Nonrheumatic aortic (valve) stenosis: Secondary | ICD-10-CM | POA: Diagnosis not present

## 2022-08-12 DIAGNOSIS — E662 Morbid (severe) obesity with alveolar hypoventilation: Secondary | ICD-10-CM | POA: Diagnosis not present

## 2022-08-12 DIAGNOSIS — Z6841 Body Mass Index (BMI) 40.0 and over, adult: Secondary | ICD-10-CM | POA: Diagnosis not present

## 2022-08-12 DIAGNOSIS — E785 Hyperlipidemia, unspecified: Secondary | ICD-10-CM | POA: Diagnosis not present

## 2022-08-12 DIAGNOSIS — Z85118 Personal history of other malignant neoplasm of bronchus and lung: Secondary | ICD-10-CM | POA: Diagnosis not present

## 2022-08-12 DIAGNOSIS — J439 Emphysema, unspecified: Secondary | ICD-10-CM | POA: Diagnosis not present

## 2022-08-12 DIAGNOSIS — J9622 Acute and chronic respiratory failure with hypercapnia: Secondary | ICD-10-CM | POA: Diagnosis not present

## 2022-08-12 DIAGNOSIS — I110 Hypertensive heart disease with heart failure: Secondary | ICD-10-CM | POA: Diagnosis not present

## 2022-08-12 DIAGNOSIS — I2510 Atherosclerotic heart disease of native coronary artery without angina pectoris: Secondary | ICD-10-CM | POA: Diagnosis not present

## 2022-08-12 DIAGNOSIS — Z7984 Long term (current) use of oral hypoglycemic drugs: Secondary | ICD-10-CM | POA: Diagnosis not present

## 2022-08-12 DIAGNOSIS — F32A Depression, unspecified: Secondary | ICD-10-CM | POA: Diagnosis not present

## 2022-08-12 DIAGNOSIS — B9620 Unspecified Escherichia coli [E. coli] as the cause of diseases classified elsewhere: Secondary | ICD-10-CM | POA: Diagnosis not present

## 2022-08-12 DIAGNOSIS — K219 Gastro-esophageal reflux disease without esophagitis: Secondary | ICD-10-CM | POA: Diagnosis not present

## 2022-08-12 DIAGNOSIS — E1142 Type 2 diabetes mellitus with diabetic polyneuropathy: Secondary | ICD-10-CM | POA: Diagnosis not present

## 2022-08-12 NOTE — Telephone Encounter (Signed)
Pt c/o swelling: STAT is pt has developed SOB within 24 hours  If swelling, where is the swelling located? Legs   How much weight have you gained and in what time span? 11 lbs in 6 days   Have you gained 3 pounds in a day or 5 pounds in a week? Yes   Do you have a log of your daily weights (if so, list)?  05/31 231 lbs 06/03 239 lbs 06/06 242 lbs  Are you currently taking a fluid pill? Yes   Are you currently SOB? Was not with the patient at time of call  Have you traveled recently? No

## 2022-08-12 NOTE — Telephone Encounter (Signed)
Spoke to pt's daughter regarding the below message sent via mychart.  She report on 5/31 pt weighed 231 and today 242. She reported pt is also experiencing bilateral leg swelling. She stated pt does have some SOB with exertion, however she reported it's within pt's norm.   Alexandria [name]'s weight is increasing quickly in a short amount of time. On 5/31, weight was 231lbs. Today 6/6, weight is 242 lbs. We double checked weight on a second scale. It seems meds are not working and an adjustment is urgently needed   Will forward to NP for recommendations Pt's daughter  made aware of ED precaution should any new symptoms develop or worsen.

## 2022-08-13 ENCOUNTER — Other Ambulatory Visit: Payer: Self-pay

## 2022-08-13 ENCOUNTER — Encounter: Payer: Self-pay | Admitting: Internal Medicine

## 2022-08-13 ENCOUNTER — Other Ambulatory Visit: Payer: Self-pay | Admitting: Internal Medicine

## 2022-08-13 ENCOUNTER — Ambulatory Visit (HOSPITAL_COMMUNITY): Payer: Medicare Other

## 2022-08-13 ENCOUNTER — Ambulatory Visit (INDEPENDENT_AMBULATORY_CARE_PROVIDER_SITE_OTHER): Payer: Medicare Other | Admitting: Podiatry

## 2022-08-13 ENCOUNTER — Inpatient Hospital Stay: Payer: Medicare Other

## 2022-08-13 VITALS — BP 128/54 | HR 68 | Temp 96.4°F | Resp 18

## 2022-08-13 DIAGNOSIS — L600 Ingrowing nail: Secondary | ICD-10-CM | POA: Diagnosis not present

## 2022-08-13 DIAGNOSIS — Z85118 Personal history of other malignant neoplasm of bronchus and lung: Secondary | ICD-10-CM | POA: Diagnosis not present

## 2022-08-13 DIAGNOSIS — J449 Chronic obstructive pulmonary disease, unspecified: Secondary | ICD-10-CM | POA: Diagnosis not present

## 2022-08-13 DIAGNOSIS — D649 Anemia, unspecified: Secondary | ICD-10-CM

## 2022-08-13 DIAGNOSIS — I5032 Chronic diastolic (congestive) heart failure: Secondary | ICD-10-CM

## 2022-08-13 DIAGNOSIS — I25118 Atherosclerotic heart disease of native coronary artery with other forms of angina pectoris: Secondary | ICD-10-CM

## 2022-08-13 DIAGNOSIS — N183 Chronic kidney disease, stage 3 unspecified: Secondary | ICD-10-CM | POA: Diagnosis not present

## 2022-08-13 DIAGNOSIS — D509 Iron deficiency anemia, unspecified: Secondary | ICD-10-CM | POA: Diagnosis not present

## 2022-08-13 DIAGNOSIS — L601 Onycholysis: Secondary | ICD-10-CM | POA: Diagnosis not present

## 2022-08-13 DIAGNOSIS — J9610 Chronic respiratory failure, unspecified whether with hypoxia or hypercapnia: Secondary | ICD-10-CM | POA: Diagnosis not present

## 2022-08-13 MED ORDER — METHYLPREDNISOLONE SODIUM SUCC 125 MG IJ SOLR
40.0000 mg | Freq: Once | INTRAMUSCULAR | Status: AC
  Administered 2022-08-13: 40 mg via INTRAVENOUS

## 2022-08-13 MED ORDER — METHYLPREDNISOLONE SODIUM SUCC 125 MG IJ SOLR
INTRAMUSCULAR | Status: AC
  Filled 2022-08-13: qty 2

## 2022-08-13 MED ORDER — SODIUM CHLORIDE 0.9 % IV SOLN
Freq: Once | INTRAVENOUS | Status: AC
  Filled 2022-08-13: qty 250

## 2022-08-13 MED ORDER — SODIUM CHLORIDE 0.9 % IV SOLN
200.0000 mg | Freq: Once | INTRAVENOUS | Status: AC
  Administered 2022-08-13: 200 mg via INTRAVENOUS
  Filled 2022-08-13: qty 200

## 2022-08-13 NOTE — Telephone Encounter (Signed)
I recommend increasing torsemide to 60mg  daily for the next three days (6/7, 6/8, 6/9) and then dropping down to 40mg  daily (she is currently on 20mg  daily).  She will need office f/u next week w/ a BMET at that time.  If wt continues to increase or she develops worsening dyspnea, she may need ED evaluation and IV diuresis prior to office follow-up.  She should avoid fast/processed/salty foods and snacks, wear compression hose while awake, and keep legs elevated.  I am in the hospital on Thurs and can see Thurs afternoon @ 2 or 2:30, if no one else has availability prior to then.

## 2022-08-13 NOTE — Telephone Encounter (Signed)
Patient and daughter made aware. Daughter asked that the message be sent through MyChart.

## 2022-08-13 NOTE — Progress Notes (Signed)
Subjective:  Patient ID: Alexandria [name], female    DOB: [birth year],  MRN: [id number]  Chief Complaint  Patient presents with   Nail Problem    Patient left hallux nail lifting from nail bed. Patient requesting to remove her nail for the nail to grow back. Patient is diabetic and controls it with oral medication.     87 y.o. female presents with partial avulsion of the left hallux nail.  Is lifting off of the nailbed.  Patient is requesting for it to be removed.  She does have diabetes controlled with oral medication.  She does have neuropathy and does not have a significant amount of pain however the nail is very loose and is concerning her she wants it removed.  Past Medical History:  Diagnosis Date   Allergy, unspecified not elsewhere classified    Anxiety    Aortic stenosis, mild 03/30/2022   a. TTE 03/29/2022: EF 55-60, no RWMA, GR 1 DD, normal RVSF, mild aortic stenosis (mean 10, V-max 212 cm/s, DI 0.70)   Cataract    Chronic diastolic congestive heart failure (HCC)    a. 06/2022 Echo: EF 60-65%, no rwma, nl RV fxn, mild MR, mild AS.   COPD (chronic obstructive pulmonary disease) (HCC) 03/08/1998   PFTs 12/12/2002 FEV 1 1.42 (64%) ratio 58 with no better after B2 and DLCO75%; PFTs 11/20/09 FEV1 1.50 (73%) ratio 50 no better after B2 with DLCO 62%; Hfa 50% 11/20/2009 >75%, 01/13/10 p coaching   Coronary artery calcification seen on CT scan    a. 2012 Neg stress test.   Depression 12/07/1998   Diabetes mellitus type II 02/05/2006   Dr. Elvera Lennox with endo   Disorders of bursae and tendons in shoulder region, unspecified    Rotator cuff syndrome, right   E. coli bacteremia    Esophagitis    GERD (gastroesophageal reflux disease)    History of UTI    Hyperlipidemia 10/04/2000   Hypertension 03/08/1992   Hypothyroidism 03/08/1968   Iron deficiency anemia    Lung nodule    radiation starts 10-08-2019   Malignant neoplasm of right upper lobe of lung (HCC) 07/24/2019   Obesity    NOS    OSA (obstructive sleep apnea)    PSG 01/27/10 AHI 13, pt does not know CPAP settings   PAF (paroxysmal atrial fibrillation) (HCC)    a. 06/2022 in setting of UTI/encephalopathy-->converted on amio; b. CHA2DS2VASc = 6-->No OAC 2/2 chronic anemia/fall risk; c. 06/2022 Zio: Predominantly sinus bradycardia at 54 (3-158)'s.  1 brief run of SVT.  2.2% PVC burden.   Peripheral neuropathy    Likely due to DM per Dr. Tresa Endo hernia     Allergies  Allergen Reactions   Antihistamines, Diphenhydramine-Type Other (See Comments)    Able to tolerate only allegra.    Incruse Ellipta [Umeclidinium Bromide] Cough    Caused voice change and severe coughing   Clarithromycin Other (See Comments)    REACTION: diff swallowing and mouth blisters   Codeine Other (See Comments)    Unknown    Diphenhydramine    Pregabalin Other (See Comments)    Lack of effect for neuropathy pain.   Sulfonamide Derivatives    Tiotropium Bromide Monohydrate Other (See Comments)    Voice changes   Umeclidinium    Vraylar [Cariprazine]     intolerant    ROS: Negative except as per HPI above  Objective:  General: AAO x3, NAD  Dermatological: Onycholysis of the left hallux nail  with ingrowth proximally.  Pain with motion of the nail especially near its limit dorsiflexion of the nailbed  Vascular:  Dorsalis Pedis artery and Posterior Tibial artery pedal pulses are 2/4 bilateral.  Capillary fill time < 3 sec to all digits.   Neruologic: Grossly intact via light touch bilateral. Protective threshold intact to all sites bilateral.   Musculoskeletal: No gross boney pedal deformities bilateral. No pain, crepitus, or limitation noted with foot and ankle range of motion bilateral. Muscular strength 5/5 in all groups tested bilateral.  Gait: Unassisted, Nonantalgic.   No images are attached to the encounter.   Assessment:   1. Ingrown nail of great toe of left foot   2. Nail plate separation      Plan:  Patient  was evaluated and treated and all questions answered.  # Ingrown nail and nail plate separation left hallux -Patient elects to proceed with minor surgery to remove ingrown toenail today. Consent reviewed and signed by patient. -Ingrown nail excised. See procedure note. -Educated on post-procedure care including soaking. Written instructions provided and reviewed. -Patient to follow up in 2 weeks for nail check.  Procedure: Excision of Ingrown Toenail Location: Left 1st toe total nail avulsion Anesthesia: Lidocaine 1% plain; 1.5 mL and Marcaine 0.5% plain; 1.5 mL, digital block. Skin Prep: Betadine. Dressing: Silvadene; telfa; dry, sterile, compression dressing. Technique: Following skin prep, the toe was exsanguinated and a tourniquet was secured at the base of the toe. The affected nail border was freed, split with a nail splitter, and excised. Nail bed was irrigated out with alcohol. The tourniquet was then removed and sterile dressing applied. Disposition: Patient tolerated procedure well. Patient to return in 2 weeks for follow-up.    Return in about 2 weeks (around 08/27/2022) for Nail check.          Corinna Gab, DPM [facility]

## 2022-08-13 NOTE — Patient Instructions (Signed)

## 2022-08-14 DIAGNOSIS — J9621 Acute and chronic respiratory failure with hypoxia: Secondary | ICD-10-CM | POA: Diagnosis not present

## 2022-08-14 DIAGNOSIS — I480 Paroxysmal atrial fibrillation: Secondary | ICD-10-CM | POA: Diagnosis not present

## 2022-08-14 DIAGNOSIS — J9622 Acute and chronic respiratory failure with hypercapnia: Secondary | ICD-10-CM | POA: Diagnosis not present

## 2022-08-14 DIAGNOSIS — I110 Hypertensive heart disease with heart failure: Secondary | ICD-10-CM | POA: Diagnosis not present

## 2022-08-14 DIAGNOSIS — G903 Multi-system degeneration of the autonomic nervous system: Secondary | ICD-10-CM | POA: Diagnosis not present

## 2022-08-14 DIAGNOSIS — E1142 Type 2 diabetes mellitus with diabetic polyneuropathy: Secondary | ICD-10-CM | POA: Diagnosis not present

## 2022-08-16 ENCOUNTER — Encounter: Payer: Self-pay | Admitting: Nurse Practitioner

## 2022-08-16 ENCOUNTER — Other Ambulatory Visit: Payer: Self-pay | Admitting: Nurse Practitioner

## 2022-08-16 ENCOUNTER — Telehealth: Payer: Self-pay | Admitting: *Deleted

## 2022-08-16 ENCOUNTER — Telehealth: Payer: Self-pay | Admitting: Cardiovascular Disease

## 2022-08-16 ENCOUNTER — Other Ambulatory Visit: Payer: Self-pay

## 2022-08-16 DIAGNOSIS — I480 Paroxysmal atrial fibrillation: Secondary | ICD-10-CM | POA: Diagnosis not present

## 2022-08-16 DIAGNOSIS — J9622 Acute and chronic respiratory failure with hypercapnia: Secondary | ICD-10-CM | POA: Diagnosis not present

## 2022-08-16 DIAGNOSIS — J9621 Acute and chronic respiratory failure with hypoxia: Secondary | ICD-10-CM | POA: Diagnosis not present

## 2022-08-16 DIAGNOSIS — G903 Multi-system degeneration of the autonomic nervous system: Secondary | ICD-10-CM | POA: Diagnosis not present

## 2022-08-16 DIAGNOSIS — I25118 Atherosclerotic heart disease of native coronary artery with other forms of angina pectoris: Secondary | ICD-10-CM

## 2022-08-16 DIAGNOSIS — D500 Iron deficiency anemia secondary to blood loss (chronic): Secondary | ICD-10-CM

## 2022-08-16 DIAGNOSIS — E1142 Type 2 diabetes mellitus with diabetic polyneuropathy: Secondary | ICD-10-CM | POA: Diagnosis not present

## 2022-08-16 DIAGNOSIS — I5032 Chronic diastolic (congestive) heart failure: Secondary | ICD-10-CM

## 2022-08-16 DIAGNOSIS — I110 Hypertensive heart disease with heart failure: Secondary | ICD-10-CM | POA: Diagnosis not present

## 2022-08-16 MED ORDER — POTASSIUM CHLORIDE ER 10 MEQ PO TBCR: 10.0000 meq | EXTENDED_RELEASE_TABLET | Freq: Every day | ORAL | 3 refills | Status: DC

## 2022-08-16 NOTE — Telephone Encounter (Signed)
Returned patient's daughter's phone call, spoke with [name] LeBold 

## 2022-08-16 NOTE — Telephone Encounter (Signed)
Patient's daughter is returning call to [place], RN she states. Requesting call back.

## 2022-08-16 NOTE — Progress Notes (Signed)
Added to patient's med list as she stated she is taking it

## 2022-08-16 NOTE — Telephone Encounter (Signed)
Spoke with pt's daughter. She reported pt has already received recommendations from Ward Givens, NP on 08/13/22. She stated as of today, pt's weight decreased to 228 lbs. Daughter stated pt has an appointment on 6/17 and will re evaluate symptoms at that time as advised by Thayer Ohm.

## 2022-08-16 NOTE — Telephone Encounter (Signed)
Left a message for the patient to call back.     Antonieta Iba, MD  Jani Gravel, RN15 hours ago (8:26 PM)    Would recommend corrections below  Torsemide 20 mg daily with potassium 10 mill equivalents daily For weight 235 pounds, take torsemide 20 mg twice a day, potassium twice a day  Add metolazone 2.5 mg twice a week as needed for weight over 235 pounds take with 2 extra potassium for each metolazone   Potassium 10 mill equivalents was on the medication list previously when I saw her in clinic, does not appear to be on her medication list now and needs to be added back  Thx TGollan   UPDATE:  Per Dr. Mariah Milling,  For weight gain of 235 lbs, increase torsemide to 20 mg twice a day and potassium 10 meq twice a day For weight gain of 240 lbs, add metolazone 2.5 mg twice a week with an an additional 20 meq of potassium

## 2022-08-16 NOTE — Progress Notes (Signed)
Solumedrol 20 mg added as pre-med prior to venofer infusions.

## 2022-08-16 NOTE — Telephone Encounter (Signed)
Please see previous encounter

## 2022-08-17 ENCOUNTER — Inpatient Hospital Stay: Payer: Medicare Other

## 2022-08-17 ENCOUNTER — Other Ambulatory Visit: Payer: Self-pay | Admitting: *Deleted

## 2022-08-17 ENCOUNTER — Telehealth: Payer: Self-pay | Admitting: Family Medicine

## 2022-08-17 ENCOUNTER — Encounter: Payer: Self-pay | Admitting: Internal Medicine

## 2022-08-17 VITALS — BP 132/59 | HR 57 | Temp 96.9°F | Resp 20

## 2022-08-17 DIAGNOSIS — J9621 Acute and chronic respiratory failure with hypoxia: Secondary | ICD-10-CM | POA: Diagnosis not present

## 2022-08-17 DIAGNOSIS — D509 Iron deficiency anemia, unspecified: Secondary | ICD-10-CM | POA: Diagnosis not present

## 2022-08-17 DIAGNOSIS — G903 Multi-system degeneration of the autonomic nervous system: Secondary | ICD-10-CM | POA: Diagnosis not present

## 2022-08-17 DIAGNOSIS — I5032 Chronic diastolic (congestive) heart failure: Secondary | ICD-10-CM | POA: Diagnosis not present

## 2022-08-17 DIAGNOSIS — D500 Iron deficiency anemia secondary to blood loss (chronic): Secondary | ICD-10-CM

## 2022-08-17 DIAGNOSIS — E1142 Type 2 diabetes mellitus with diabetic polyneuropathy: Secondary | ICD-10-CM | POA: Diagnosis not present

## 2022-08-17 DIAGNOSIS — D649 Anemia, unspecified: Secondary | ICD-10-CM

## 2022-08-17 DIAGNOSIS — J9610 Chronic respiratory failure, unspecified whether with hypoxia or hypercapnia: Secondary | ICD-10-CM | POA: Diagnosis not present

## 2022-08-17 DIAGNOSIS — Z85118 Personal history of other malignant neoplasm of bronchus and lung: Secondary | ICD-10-CM | POA: Diagnosis not present

## 2022-08-17 DIAGNOSIS — N183 Chronic kidney disease, stage 3 unspecified: Secondary | ICD-10-CM | POA: Diagnosis not present

## 2022-08-17 DIAGNOSIS — I480 Paroxysmal atrial fibrillation: Secondary | ICD-10-CM | POA: Diagnosis not present

## 2022-08-17 DIAGNOSIS — J9622 Acute and chronic respiratory failure with hypercapnia: Secondary | ICD-10-CM | POA: Diagnosis not present

## 2022-08-17 DIAGNOSIS — I110 Hypertensive heart disease with heart failure: Secondary | ICD-10-CM | POA: Diagnosis not present

## 2022-08-17 DIAGNOSIS — J449 Chronic obstructive pulmonary disease, unspecified: Secondary | ICD-10-CM | POA: Diagnosis not present

## 2022-08-17 LAB — HEMOGLOBIN AND HEMATOCRIT (CANCER CENTER ONLY)
HCT: 27.3 % — ABNORMAL LOW (ref 36.0–46.0)
Hemoglobin: 8 g/dL — ABNORMAL LOW (ref 12.0–15.0)

## 2022-08-17 MED ORDER — METHYLPREDNISOLONE SODIUM SUCC 40 MG IJ SOLR: 20.0000 mg | Freq: Once | INTRAMUSCULAR | Status: DC | PRN

## 2022-08-17 MED ORDER — METHYLPREDNISOLONE NA SUC (PF) 125 MG IJ SOLR
20.0000 mg | Freq: Once | INTRAMUSCULAR | Status: AC
  Administered 2022-08-17: 20 mg via INTRAVENOUS
  Filled 2022-08-17: qty 0.32

## 2022-08-17 MED ORDER — SODIUM CHLORIDE 0.9 % IV SOLN
Freq: Once | INTRAVENOUS | Status: AC
  Filled 2022-08-17: qty 250

## 2022-08-17 MED ORDER — SODIUM CHLORIDE 0.9 % IV SOLN
200.0000 mg | Freq: Once | INTRAVENOUS | Status: AC
  Administered 2022-08-17: 200 mg via INTRAVENOUS
  Filled 2022-08-17: qty 10

## 2022-08-17 NOTE — Telephone Encounter (Signed)
Please give the order.  Thanks.   

## 2022-08-17 NOTE — Telephone Encounter (Signed)
Home Health verbal orders Caller Name: Cecelia Agency Name: [facility]  Callback number: [phone] (ok to leave vm w/ title)  Requesting PT  Reason: Gate training, balance, and strengthening  Frequency: 1x a week for 1 week/ 2x a week for 2 weeks/ 1x a week for 3 weeks/ 1x every other 2 weeks/ 1x a week for 1 week  Please forward to [facility] pool or providers CMA

## 2022-08-17 NOTE — Telephone Encounter (Signed)
Reviewed chart- looks like cardiology requested at [facility]. I spoke w/Lauren, NP. Orders for metb entered. Msg sent to cardiology. If there are any different labs needed other than a metb, we can add those labs. However, looks like an H&H and metb is all that is needed for today's labs.

## 2022-08-18 DIAGNOSIS — J9621 Acute and chronic respiratory failure with hypoxia: Secondary | ICD-10-CM | POA: Diagnosis not present

## 2022-08-18 DIAGNOSIS — E1142 Type 2 diabetes mellitus with diabetic polyneuropathy: Secondary | ICD-10-CM | POA: Diagnosis not present

## 2022-08-18 DIAGNOSIS — J9622 Acute and chronic respiratory failure with hypercapnia: Secondary | ICD-10-CM | POA: Diagnosis not present

## 2022-08-18 DIAGNOSIS — G903 Multi-system degeneration of the autonomic nervous system: Secondary | ICD-10-CM | POA: Diagnosis not present

## 2022-08-18 DIAGNOSIS — I110 Hypertensive heart disease with heart failure: Secondary | ICD-10-CM | POA: Diagnosis not present

## 2022-08-18 DIAGNOSIS — I480 Paroxysmal atrial fibrillation: Secondary | ICD-10-CM | POA: Diagnosis not present

## 2022-08-18 NOTE — Telephone Encounter (Signed)
Verbal orders given  

## 2022-08-19 ENCOUNTER — Inpatient Hospital Stay: Payer: Medicare Other

## 2022-08-19 ENCOUNTER — Encounter: Payer: Self-pay | Admitting: Internal Medicine

## 2022-08-19 VITALS — BP 106/78 | HR 57 | Temp 97.1°F | Resp 18

## 2022-08-19 DIAGNOSIS — N183 Chronic kidney disease, stage 3 unspecified: Secondary | ICD-10-CM | POA: Diagnosis not present

## 2022-08-19 DIAGNOSIS — D649 Anemia, unspecified: Secondary | ICD-10-CM

## 2022-08-19 DIAGNOSIS — I25118 Atherosclerotic heart disease of native coronary artery with other forms of angina pectoris: Secondary | ICD-10-CM

## 2022-08-19 DIAGNOSIS — I5032 Chronic diastolic (congestive) heart failure: Secondary | ICD-10-CM | POA: Diagnosis not present

## 2022-08-19 DIAGNOSIS — J9610 Chronic respiratory failure, unspecified whether with hypoxia or hypercapnia: Secondary | ICD-10-CM | POA: Diagnosis not present

## 2022-08-19 DIAGNOSIS — J449 Chronic obstructive pulmonary disease, unspecified: Secondary | ICD-10-CM | POA: Diagnosis not present

## 2022-08-19 DIAGNOSIS — D509 Iron deficiency anemia, unspecified: Secondary | ICD-10-CM | POA: Diagnosis not present

## 2022-08-19 DIAGNOSIS — D500 Iron deficiency anemia secondary to blood loss (chronic): Secondary | ICD-10-CM

## 2022-08-19 DIAGNOSIS — Z85118 Personal history of other malignant neoplasm of bronchus and lung: Secondary | ICD-10-CM | POA: Diagnosis not present

## 2022-08-19 LAB — BASIC METABOLIC PANEL - CANCER CENTER ONLY
Anion gap: 8 (ref 5–15)
BUN: 33 mg/dL — ABNORMAL HIGH (ref 8–23)
CO2: 36 mmol/L — ABNORMAL HIGH (ref 22–32)
Calcium: 8.7 mg/dL — ABNORMAL LOW (ref 8.9–10.3)
Chloride: 99 mmol/L (ref 98–111)
Creatinine: 1.33 mg/dL — ABNORMAL HIGH (ref 0.44–1.00)
GFR, Estimated: 39 mL/min — ABNORMAL LOW (ref >60)
Glucose, Bld: 118 mg/dL — ABNORMAL HIGH (ref 70–99)
Potassium: 4.5 mmol/L (ref 3.5–5.1)
Sodium: 143 mmol/L (ref 135–145)

## 2022-08-19 LAB — HEMOGLOBIN AND HEMATOCRIT (CANCER CENTER ONLY)
HCT: 27.9 % — ABNORMAL LOW (ref 36.0–46.0)
Hemoglobin: 8.2 g/dL — ABNORMAL LOW (ref 12.0–15.0)

## 2022-08-19 MED ORDER — SODIUM CHLORIDE 0.9 % IV SOLN
Freq: Once | INTRAVENOUS | Status: AC
  Filled 2022-08-19: qty 250

## 2022-08-19 MED ORDER — METHYLPREDNISOLONE SODIUM SUCC 125 MG IJ SOLR
20.0000 mg | Freq: Once | INTRAMUSCULAR | Status: AC | PRN
  Administered 2022-08-19: 20 mg via INTRAVENOUS
  Filled 2022-08-19: qty 2

## 2022-08-19 MED ORDER — SODIUM CHLORIDE 0.9 % IV SOLN
200.0000 mg | Freq: Once | INTRAVENOUS | Status: AC
  Administered 2022-08-19: 200 mg via INTRAVENOUS
  Filled 2022-08-19: qty 200

## 2022-08-20 ENCOUNTER — Ambulatory Visit (HOSPITAL_COMMUNITY): Payer: Medicare Other

## 2022-08-20 ENCOUNTER — Telehealth: Payer: Self-pay | Admitting: *Deleted

## 2022-08-20 DIAGNOSIS — G903 Multi-system degeneration of the autonomic nervous system: Secondary | ICD-10-CM | POA: Diagnosis not present

## 2022-08-20 DIAGNOSIS — J9621 Acute and chronic respiratory failure with hypoxia: Secondary | ICD-10-CM | POA: Diagnosis not present

## 2022-08-20 DIAGNOSIS — E1142 Type 2 diabetes mellitus with diabetic polyneuropathy: Secondary | ICD-10-CM | POA: Diagnosis not present

## 2022-08-20 DIAGNOSIS — I480 Paroxysmal atrial fibrillation: Secondary | ICD-10-CM | POA: Diagnosis not present

## 2022-08-20 DIAGNOSIS — I110 Hypertensive heart disease with heart failure: Secondary | ICD-10-CM | POA: Diagnosis not present

## 2022-08-20 DIAGNOSIS — J9622 Acute and chronic respiratory failure with hypercapnia: Secondary | ICD-10-CM | POA: Diagnosis not present

## 2022-08-20 NOTE — Telephone Encounter (Signed)
Called patient to inform of CT for 09-17-22- arrival time- 2:45 pm @ [facility], no restrictions to test, patient to receive results from Laurence Aly on 09-27-22 @ 3:30 pm via telephone, spoke with patient's daughter Keenan Bachelor and she is aware of these appts. and the instructions

## 2022-08-22 ENCOUNTER — Other Ambulatory Visit: Payer: Self-pay | Admitting: Cardiology

## 2022-08-22 NOTE — Progress Notes (Unsigned)
Date:  08/23/2022   ID:  Alexandria [name], DOB 1935/04/27, MRN [id number]  Patient Location:  [address] [facility] [place] [address]   Provider location:   [facility], [place] office  PCP:  Joaquim Nam, MD  Cardiologist:  Hubbard Robinson [facility]  Chief Complaint  Patient presents with   [facility] follow up     Patient c/o shortness of breath at times with mild LE edema. Medications reviewed by the patient verbally.      History of Present Illness:    Alexandria [name] is a 87 y.o. female with past medical history of obesity,   long history of smoking for 50 years, COPD, quit 15 years DM2, dx in 2006, non-insulin-dependent, controlled, with  mild CKD hyperlipidemia,  hypertension,   2012  chest pain, stress test at that time with no ischemia, normal EF  July 2016 with COPD exacerbation, sepsis. sleep apnea, not on CPAP, seen by Dr. Lucious Groves, mask does not fit Chronic SOB, normal echo 09/2014 Anxiety Diagnosis of lung cancer,  completed radiation , planned chemo Normal ejection fraction December 2022 Echo January 2024 EF 55 to 60%, mild AS She presents for follow-up of her shortness of breath symptoms  Last seen in clinic by myself March 2024 Seen by one of our providers Aug 06, 2022   admitted to hospital April 2024 in the setting of fevers, chills, and lethargy.     UA showed UTI.   developed Afib w/ RVR in the 140's after removing her BiPAP at night and becoming hypoxic.  She was initially placed on IV amiodarone however, she converted to sinus rhythm and amiodarone was discontinued in the setting of bradycardia.    Echo showed normal LV function with an EF of 60 to 65%, mild MR, mild aortic stenosis.   discharged home on amiodarone.   No oral anticoagulation in the setting of anemia with high fall risk.  ZIO monitor was placed which showed sinus rhythm and 1 brief run of SVT.  No recurrent atrial fibrillation.  2.2% PVC burden.     rehab  facility following hospital discharge   Recently received a phone call concerning weight gain and worsening leg swelling At that time was taking torsemide 20 daily Was recommended to increase torsemide up to 60 mg daily for 3 days then down to 40 mg daily since June 10th Prior weight 242 pounds, recent weight 226-228 Abdomen is soft, leg swelling improved  No significant chest tightness Continues to use oxygen No compression hose today  sedentary at baseline Has a aide that comes to the house Previously required Unna boots for leg swelling  EKG personally reviewed by myself on todays visit Nsr rate 61 beats per minute, poor R wave progression to the anterior precordial leads, ST-T wave abnormality 1 and aVL, appears relatively unchanged from prior EKG  Echocardiogram February 11, 2021 reviewed  1. Left ventricular ejection fraction, by estimation, is 55 to 60%. The  left ventricle has normal function. The left ventricle has no regional  wall motion abnormalities. Left ventricular diastolic parameters are  consistent with Grade II diastolic  dysfunction (pseudonormalization).   2. Right ventricular systolic function is normal. The right ventricular  size is normal.   3. Left atrial size was mild to moderately dilated.   4. The mitral valve is degenerative. No evidence of mitral valve  regurgitation. Moderate to severe mitral annular calcification.   5. The aortic valve was  not well visualized. Aortic valve regurgitation  is not visualized.   6. The inferior vena cava is normal in size with <50% respiratory  variability, suggesting right atrial pressure of 8 mmHg.    Past Medical History:  Diagnosis Date   Allergy, unspecified not elsewhere classified    Anxiety    Aortic stenosis, mild 03/30/2022   a. TTE 03/29/2022: EF 55-60, no RWMA, GR 1 DD, normal RVSF, mild aortic stenosis (mean 10, V-max 212 cm/s, DI 0.70)   Cataract    Chronic diastolic congestive heart failure (HCC)     a. 06/2022 Echo: EF 60-65%, no rwma, nl RV fxn, mild MR, mild AS.   COPD (chronic obstructive pulmonary disease) (HCC) 03/08/1998   PFTs 12/12/2002 FEV 1 1.42 (64%) ratio 58 with no better after B2 and DLCO75%; PFTs 11/20/09 FEV1 1.50 (73%) ratio 50 no better after B2 with DLCO 62%; Hfa 50% 11/20/2009 >75%, 01/13/10 p coaching   Coronary artery calcification seen on CT scan    a. 2012 Neg stress test.   Depression 12/07/1998   Diabetes mellitus type II 02/05/2006   Dr. Elvera Lennox with endo   Disorders of bursae and tendons in shoulder region, unspecified    Rotator cuff syndrome, right   E. coli bacteremia    Esophagitis    GERD (gastroesophageal reflux disease)    History of UTI    Hyperlipidemia 10/04/2000   Hypertension 03/08/1992   Hypothyroidism 03/08/1968   Iron deficiency anemia    Lung nodule    radiation starts 10-08-2019   Malignant neoplasm of right upper lobe of lung (HCC) 07/24/2019   Obesity    NOS   OSA (obstructive sleep apnea)    PSG 01/27/10 AHI 13, pt does not know CPAP settings   PAF (paroxysmal atrial fibrillation) (HCC)    a. 06/2022 in setting of UTI/encephalopathy-->converted on amio; b. CHA2DS2VASc = 6-->No OAC 2/2 chronic anemia/fall risk; c. 06/2022 Zio: Predominantly sinus bradycardia at 54 (3-158)'s.  1 brief run of SVT.  2.2% PVC burden.   Peripheral neuropathy    Likely due to DM per Dr. Tresa Endo hernia    Past Surgical History:  Procedure Laterality Date   ABD U/S  03/19/1999   Nml x2 foci in liver   ADENOSINE MYOVIEW  06/02/2007   Nml   CARDIOLITE PERSANTINE  08/24/2000   Nml   CAROTID U/S  08/24/2000   1-39% ICA stenosis   CAROTID U/S  06/02/2007   No apprec change    CARPAL TUNNEL RELEASE  12/1997   Right   CESAREAN SECTION     x2 Breech/ repeat   CHOLECYSTECTOMY  1997   COLONOSCOPY WITH PROPOFOL N/A 02/12/2016   Procedure: COLONOSCOPY WITH PROPOFOL;  Surgeon: Rachael Fee, MD;  Location: [facility];  Service: Endoscopy;   Laterality: N/A;   CT ABD W & PELVIS WO/W CM     Abd hemangiomas of liver, 1 cm R renal cyst   DENTAL SURGERY  2016   Implants   DEXA  07/03/2003   Nml   ESOPHAGOGASTRODUODENOSCOPY  12/05/1997   Nml (due to hoarseness)   ESOPHAGOGASTRODUODENOSCOPY (EGD) WITH PROPOFOL N/A 02/12/2016   Procedure: ESOPHAGOGASTRODUODENOSCOPY (EGD) WITH PROPOFOL;  Surgeon: Rachael Fee, MD;  Location: [facility];  Service: Endoscopy;  Laterality: N/A;   GALLBLADDER SURGERY     HERNIA REPAIR  01/24/2009   Lap Ventr w/ Lysis of adhesions (Dr. Dwain Sarna)   knee arthroscopic surgery  years ago   right  ROTATOR CUFF REPAIR  1984   Right, [place]   SHOULDER OPEN ROTATOR CUFF REPAIR  02/08/2012   Procedure: ROTATOR CUFF REPAIR SHOULDER OPEN;  Surgeon: Drucilla Schmidt, MD;  Location: [facility];  Service: Orthopedics;  Laterality: Left;  Left Shoulder Open Anterior Acrominectomy Rotator Cuff Repair Open Distal Clavicle Resection ,tissue mend graft, and repair of biceps tendon   THUMB RELEASE  12/1997   Right   TONSILLECTOMY     TOTAL ABDOMINAL HYSTERECTOMY  1985   Due to dysmennorhea   US ECHOCARDIOGRAPHY  06/02/2007     Allergies:   Antihistamines, diphenhydramine-type; Incruse ellipta [umeclidinium bromide]; Clarithromycin; Codeine; Diphenhydramine; Pregabalin; Sulfonamide derivatives; Tiotropium bromide monohydrate; Umeclidinium; and Vraylar [cariprazine]   Social History   Tobacco Use   Smoking status: Former    Packs/day: 2.00    Years: 50.00    Additional pack years: 0.00    Total pack years: 100.00    Types: Cigarettes    Quit date: 02/05/2005    Years since quitting: 17.5   Smokeless tobacco: Never  Vaping Use   Vaping Use: Never used  Substance Use Topics   Alcohol use: Yes    Comment: occasionally   Drug use: Never     Current Outpatient Medications on File Prior to Visit  Medication Sig Dispense Refill   acetaminophen (TYLENOL) 325 MG tablet Take 2 tablets (650 mg total) by mouth  every 6 (six) hours as needed for mild pain, headache, fever or moderate pain.     albuterol (PROAIR HFA) 108 (90 Base) MCG/ACT inhaler Inhale 2 puffs into the lungs every 6 (six) hours as needed for wheezing or shortness of breath. INHALE 2 PUFFS INTO THE LUNGS EVERY 6 HOURS AS NEEDED FOR WHEEZING OR SHORTNESS OF BREATH 3 Inhaler 3   amiodarone (PACERONE) 200 MG tablet Take 1 tablet (200 mg total) by mouth daily. 90 tablet 1   arformoterol (BROVANA) 15 MCG/2ML NEBU Take 2 mLs (15 mcg total) by nebulization at bedtime. 120 mL 6   bisoprolol (ZEBETA) 5 MG tablet Take 0.5 tablets (2.5 mg total) by mouth daily. 45 tablet 1   budesonide (PULMICORT) 0.5 MG/2ML nebulizer solution Take 2 mLs (0.5 mg total) by nebulization in the morning and at bedtime. 360 mL 3   busPIRone (BUSPAR) 15 MG tablet Take 15 mg by mouth 3 (three) times daily.     Cholecalciferol (VITAMIN D) 50 MCG (2000 UT) tablet Take 1 tablet (2,000 Units total) by mouth daily.     Coenzyme Q-10 200 MG CAPS Take 200 mg by mouth daily.     Cyanocobalamin (B-12) 5000 MCG CAPS Take 5,000 mcg by mouth daily.     doxycycline (VIBRAMYCIN) 100 MG capsule Take 1 capsule (100 mg total) by mouth 2 (two) times daily. 14 capsule 0   esomeprazole (NEXIUM) 40 MG capsule Take 1 capsule (40 mg total) by mouth daily at 12 noon. 30 capsule 11   ezetimibe (ZETIA) 10 MG tablet TAKE 1 TABLET BY MOUTH EVERYDAY AT BEDTIME 90 tablet 2   ferrous sulfate 325 (65 FE) MG tablet TAKE 1 TABLET BY MOUTH EVERY DAY 90 tablet 3   FLUoxetine (PROZAC) 20 MG tablet Take 20 mg by mouth 2 (two) times daily.     glucose blood (ONETOUCH VERIO) test strip USE AS INSTRUCTED TO CHECK SUGAR 1 TIME DAILY 100 strip 3   guaiFENesin (MUCINEX) 600 MG 12 hr tablet Take 2 tablets (1,200 mg total) by mouth 2 (two) times daily as needed  for cough or to loosen phlegm.     isosorbide mononitrate (IMDUR) 30 MG 24 hr tablet Take 30 mg by mouth daily.     levothyroxine (SYNTHROID) 125 MCG tablet Take  1 tablet (125 mcg total) by mouth daily. 90 tablet 3   magnesium 30 MG tablet Take 30 mg by mouth 2 (two) times daily.     melatonin 5 MG TABS Take 1 tablet (5 mg total) by mouth at bedtime.  0   metFORMIN (GLUCOPHAGE) 500 MG tablet Take 1 tablet (500 mg total) by mouth every evening. 90 tablet 3   nitroGLYCERIN (NITROSTAT) 0.4 MG SL tablet Place 1 tablet (0.4 mg total) under the tongue every 5 (five) minutes as needed for chest pain. 25 tablet 3   nystatin (MYCOSTATIN) 100000 UNIT/ML suspension Take 5 mLs (500,000 Units total) by mouth 4 (four) times daily. 120 mL 0   OneTouch Delica Lancets 33G MISC Use 1x a day 100 each 3   OXYGEN Inhale into the lungs. 2lpm     potassium chloride (KLOR-CON) 10 MEQ tablet Take 1 tablet (10 mEq total) by mouth daily. 90 tablet 3   rosuvastatin (CRESTOR) 10 MG tablet TAKE 1 TABLET BY MOUTH EVERY OTHER DAY 45 tablet 2   Spacer/Aero Chamber Mouthpiece MISC Use with  inhaler as needed.  J44.9 1 each 0   Torsemide 40 MG TABS Take 40 mg by mouth daily.     Zinc Oxide (TRIPLE PASTE) 12.8 % ointment Apply 1 Application topically as needed for irritation. Every shift.     No current facility-administered medications on file prior to visit.    Family Hx: The patient's family history includes Cystic fibrosis in her sister; Emphysema in her father; Heart disease in her mother; Hyperthyroidism in her brother and sister; Osteoarthritis in her brother; Thyroid disease in her mother. There is no history of Esophageal cancer, Cancer, Colon cancer, Stomach cancer, or Breast cancer.  ROS:   Please see the history of present illness.    Review of Systems  Constitutional: Negative.   HENT: Negative.    Respiratory: Negative.    Cardiovascular:  Positive for leg swelling.  Gastrointestinal: Negative.   Musculoskeletal: Negative.   Neurological: Negative.   Psychiatric/Behavioral: Negative.    All other systems reviewed and are negative.   Labs/Other Tests and Data  Reviewed:    Recent Labs: 04/22/2022: Pro B Natriuretic peptide (BNP) 69.0 06/29/2022: B Natriuretic Peptide 245.1 06/30/2022: TSH 0.899 07/07/2022: Magnesium 2.0 08/09/2022: ALT 15; Platelets 193 08/19/2022: BUN 33; Creatinine 1.33; Hemoglobin 8.2; Potassium 4.5; Sodium 143   Recent Lipid Panel Lab Results  Component Value Date/Time   CHOL 140 08/06/2021 04:24 PM   TRIG 99.0 08/06/2021 04:24 PM   HDL 57.80 08/06/2021 04:24 PM   CHOLHDL 2 08/06/2021 04:24 PM   LDLCALC 62 08/06/2021 04:24 PM   LDLDIRECT 63.0 07/29/2015 01:08 PM    Wt Readings from Last 3 Encounters:  08/23/22 226 lb 8 oz (102.7 kg)  08/09/22 231 lb 12.8 oz (105.1 kg)  08/06/22 231 lb (104.8 kg)     Exam:    BP (!) 140/60 (BP Location: Left Arm, Patient Position: Sitting, Cuff Size: Normal)   Pulse 61   Ht 5\' 5"  (1.651 m)   Wt 226 lb 8 oz (102.7 kg)   LMP  (LMP Unknown)   SpO2 96%   BMI 37.69 kg/m  Constitutional:  oriented to person, place, and time. No distress.  In a wheelchair on oxygen HENT:  Head: Grossly normal Eyes:  no discharge. No scleral icterus.  Neck: No JVD, no carotid bruits  Cardiovascular: Regular rate and rhythm, no murmurs appreciated Trace nonpitting lower extremity edema to the mid shins Pulmonary/Chest: Clear to auscultation bilaterally, no wheezes or rails Abdominal: Soft.  no distension.  no tenderness.  Musculoskeletal: Normal range of motion Neurological:  normal muscle tone. Coordination normal. No atrophy Skin: Skin warm and dry Psychiatric: normal affect, pleasant  ASSESSMENT & PLAN:    Problem List Items Addressed This Visit       Cardiology Problems   CAD (coronary artery disease) (Chronic)   Relevant Medications   isosorbide mononitrate (IMDUR) 30 MG 24 hr tablet   Torsemide 40 MG TABS   Other Relevant Orders   EKG 12-Lead   Paroxysmal atrial fibrillation (HCC)   Relevant Medications   isosorbide mononitrate (IMDUR) 30 MG 24 hr tablet   Torsemide 40 MG TABS    Other Relevant Orders   EKG 12-Lead   Chronic diastolic CHF (congestive heart failure) (HCC) - Primary   Relevant Medications   isosorbide mononitrate (IMDUR) 30 MG 24 hr tablet   Torsemide 40 MG TABS   Other Relevant Orders   EKG 12-Lead   Hyperlipemia   Relevant Medications   isosorbide mononitrate (IMDUR) 30 MG 24 hr tablet   Torsemide 40 MG TABS   Other Visit Diagnoses     Chronic heart failure with preserved ejection fraction (HCC)       Relevant Medications   isosorbide mononitrate (IMDUR) 30 MG 24 hr tablet   Torsemide 40 MG TABS   Type 2 diabetes mellitus with stage 3b chronic kidney disease, without long-term current use of insulin (HCC)       Lymphedema       Relevant Orders   EKG 12-Lead   Orthostatic hypotension       Relevant Medications   isosorbide mononitrate (IMDUR) 30 MG 24 hr tablet   Torsemide 40 MG TABS   Stage 3 chronic kidney disease, unspecified whether stage 3a or 3b CKD (HCC)          Chronic diastolic CHF/pulmonary hypertension Normal ejection fraction on echo Diuretic sliding scale as below Weight less than 230 pounds take torsemide 40 daily Weight over 230 pounds increase torsemide up to 60 mg daily Potassium 10 mill equivalents daily, recent BMP with normal potassium  Gait instability Uses a walker, no recent falls Has completed PT  COPD Long smoking history Followed by pulmonary Not on CPAP Uses chronic oxygen  Borderline diabetes Chronic renal insufficiency, creatinine 1.33, GFR 39  Severe mitral valve annular calcification with no significant stenosis Recent echocardiogram April 2024 no significant stenosis   Total encounter time more than 40 minutes  Greater than 50% was spent in counseling and coordination of care with the patient   Signed, Julien Nordmann, MD  [facility] [address], [place], [place] [address]

## 2022-08-23 ENCOUNTER — Encounter: Payer: Self-pay | Admitting: Cardiovascular Disease

## 2022-08-23 ENCOUNTER — Ambulatory Visit: Payer: Medicare Other | Admitting: Radiation Oncology

## 2022-08-23 ENCOUNTER — Ambulatory Visit: Payer: Medicare Other | Attending: Cardiovascular Disease | Admitting: Cardiovascular Disease

## 2022-08-23 ENCOUNTER — Other Ambulatory Visit: Payer: Self-pay | Admitting: Cardiovascular Disease

## 2022-08-23 VITALS — BP 140/60 | HR 61 | Ht 65.0 in | Wt 226.5 lb

## 2022-08-23 DIAGNOSIS — I5032 Chronic diastolic (congestive) heart failure: Secondary | ICD-10-CM | POA: Diagnosis not present

## 2022-08-23 DIAGNOSIS — N1832 Chronic kidney disease, stage 3b: Secondary | ICD-10-CM

## 2022-08-23 DIAGNOSIS — N183 Chronic kidney disease, stage 3 unspecified: Secondary | ICD-10-CM

## 2022-08-23 DIAGNOSIS — I890 Lymphedema, not elsewhere classified: Secondary | ICD-10-CM

## 2022-08-23 DIAGNOSIS — Z85118 Personal history of other malignant neoplasm of bronchus and lung: Secondary | ICD-10-CM

## 2022-08-23 DIAGNOSIS — E039 Hypothyroidism, unspecified: Secondary | ICD-10-CM

## 2022-08-23 DIAGNOSIS — I25118 Atherosclerotic heart disease of native coronary artery with other forms of angina pectoris: Secondary | ICD-10-CM

## 2022-08-23 DIAGNOSIS — Z9981 Dependence on supplemental oxygen: Secondary | ICD-10-CM

## 2022-08-23 DIAGNOSIS — I951 Orthostatic hypotension: Secondary | ICD-10-CM | POA: Diagnosis not present

## 2022-08-23 DIAGNOSIS — J9621 Acute and chronic respiratory failure with hypoxia: Secondary | ICD-10-CM

## 2022-08-23 DIAGNOSIS — F419 Anxiety disorder, unspecified: Secondary | ICD-10-CM

## 2022-08-23 DIAGNOSIS — G4733 Obstructive sleep apnea (adult) (pediatric): Secondary | ICD-10-CM

## 2022-08-23 DIAGNOSIS — I110 Hypertensive heart disease with heart failure: Secondary | ICD-10-CM

## 2022-08-23 DIAGNOSIS — E1142 Type 2 diabetes mellitus with diabetic polyneuropathy: Secondary | ICD-10-CM

## 2022-08-23 DIAGNOSIS — E785 Hyperlipidemia, unspecified: Secondary | ICD-10-CM

## 2022-08-23 DIAGNOSIS — Z792 Long term (current) use of antibiotics: Secondary | ICD-10-CM

## 2022-08-23 DIAGNOSIS — E1122 Type 2 diabetes mellitus with diabetic chronic kidney disease: Secondary | ICD-10-CM | POA: Diagnosis not present

## 2022-08-23 DIAGNOSIS — J441 Chronic obstructive pulmonary disease with (acute) exacerbation: Secondary | ICD-10-CM

## 2022-08-23 DIAGNOSIS — Z7984 Long term (current) use of oral hypoglycemic drugs: Secondary | ICD-10-CM

## 2022-08-23 DIAGNOSIS — Z6841 Body Mass Index (BMI) 40.0 and over, adult: Secondary | ICD-10-CM

## 2022-08-23 DIAGNOSIS — I480 Paroxysmal atrial fibrillation: Secondary | ICD-10-CM | POA: Diagnosis not present

## 2022-08-23 DIAGNOSIS — Z79899 Other long term (current) drug therapy: Secondary | ICD-10-CM

## 2022-08-23 DIAGNOSIS — I2510 Atherosclerotic heart disease of native coronary artery without angina pectoris: Secondary | ICD-10-CM

## 2022-08-23 DIAGNOSIS — J9622 Acute and chronic respiratory failure with hypercapnia: Secondary | ICD-10-CM

## 2022-08-23 DIAGNOSIS — F32A Depression, unspecified: Secondary | ICD-10-CM

## 2022-08-23 DIAGNOSIS — N390 Urinary tract infection, site not specified: Secondary | ICD-10-CM

## 2022-08-23 DIAGNOSIS — E662 Morbid (severe) obesity with alveolar hypoventilation: Secondary | ICD-10-CM

## 2022-08-23 DIAGNOSIS — J439 Emphysema, unspecified: Secondary | ICD-10-CM

## 2022-08-23 DIAGNOSIS — B9620 Unspecified Escherichia coli [E. coli] as the cause of diseases classified elsewhere: Secondary | ICD-10-CM

## 2022-08-23 DIAGNOSIS — E782 Mixed hyperlipidemia: Secondary | ICD-10-CM

## 2022-08-23 DIAGNOSIS — K219 Gastro-esophageal reflux disease without esophagitis: Secondary | ICD-10-CM

## 2022-08-23 DIAGNOSIS — Z9181 History of falling: Secondary | ICD-10-CM

## 2022-08-23 DIAGNOSIS — I350 Nonrheumatic aortic (valve) stenosis: Secondary | ICD-10-CM

## 2022-08-23 DIAGNOSIS — D509 Iron deficiency anemia, unspecified: Secondary | ICD-10-CM

## 2022-08-23 DIAGNOSIS — J4489 Other specified chronic obstructive pulmonary disease: Secondary | ICD-10-CM

## 2022-08-23 DIAGNOSIS — G903 Multi-system degeneration of the autonomic nervous system: Secondary | ICD-10-CM

## 2022-08-23 MED ORDER — TORSEMIDE 40 MG PO TABS: 40.0000 mg | ORAL_TABLET | Freq: Every day | ORAL | 3 refills | Status: DC

## 2022-08-23 MED ORDER — MAGNESIUM 30 MG PO TABS: 30.0000 mg | ORAL_TABLET | Freq: Two times a day (BID) | ORAL | 3 refills | Status: DC

## 2022-08-23 NOTE — Patient Instructions (Addendum)
Medication Instructions:  Torsemide 40 mg daily with extra 20 mg as needed for weight >230  If you need a refill on your cardiac medications before your next appointment, please call your pharmacy.   Lab work: BMP, magnesium in one month, [facility]  Testing/Procedures: No new testing needed  Follow-Up: At [facility], you and your health needs are our priority.  As part of our continuing mission to provide you with exceptional heart care, we have created designated Provider Care Teams.  These Care Teams include your primary Cardiologist (physician) and Advanced Practice Providers (APPs -  Physician Assistants and Nurse Practitioners) who all work together to provide you with the care you need, when you need it.  You will need a follow up appointment in 3 months  Providers on your designated Care Team:   Nicolasa Ducking, NP Eula Listen, PA-C Cadence Fransico Michael, [place]  COVID-19 Vaccine Information can be found at: [email or web address] For questions related to vaccine distribution or appointments, please email [email or web address] or call [phone].

## 2022-08-24 ENCOUNTER — Inpatient Hospital Stay: Payer: Medicare Other

## 2022-08-24 ENCOUNTER — Other Ambulatory Visit: Payer: Self-pay

## 2022-08-24 VITALS — BP 124/40 | HR 62 | Temp 98.3°F | Resp 18

## 2022-08-24 DIAGNOSIS — D509 Iron deficiency anemia, unspecified: Secondary | ICD-10-CM | POA: Diagnosis not present

## 2022-08-24 DIAGNOSIS — J9610 Chronic respiratory failure, unspecified whether with hypoxia or hypercapnia: Secondary | ICD-10-CM | POA: Diagnosis not present

## 2022-08-24 DIAGNOSIS — J449 Chronic obstructive pulmonary disease, unspecified: Secondary | ICD-10-CM | POA: Diagnosis not present

## 2022-08-24 DIAGNOSIS — D649 Anemia, unspecified: Secondary | ICD-10-CM

## 2022-08-24 DIAGNOSIS — D500 Iron deficiency anemia secondary to blood loss (chronic): Secondary | ICD-10-CM

## 2022-08-24 DIAGNOSIS — I5032 Chronic diastolic (congestive) heart failure: Secondary | ICD-10-CM | POA: Diagnosis not present

## 2022-08-24 DIAGNOSIS — Z85118 Personal history of other malignant neoplasm of bronchus and lung: Secondary | ICD-10-CM | POA: Diagnosis not present

## 2022-08-24 DIAGNOSIS — N183 Chronic kidney disease, stage 3 unspecified: Secondary | ICD-10-CM | POA: Diagnosis not present

## 2022-08-24 LAB — HEMOGLOBIN AND HEMATOCRIT (CANCER CENTER ONLY)
HCT: 28.3 % — ABNORMAL LOW (ref 36.0–46.0)
Hemoglobin: 8.5 g/dL — ABNORMAL LOW (ref 12.0–15.0)

## 2022-08-24 MED ORDER — SODIUM CHLORIDE 0.9 % IV SOLN
Freq: Once | INTRAVENOUS | Status: AC
  Filled 2022-08-24: qty 250

## 2022-08-24 MED ORDER — METHYLPREDNISOLONE SODIUM SUCC 40 MG IJ SOLR: 20.0000 mg | Freq: Once | INTRAMUSCULAR | Status: DC | PRN

## 2022-08-24 MED ORDER — TORSEMIDE 20 MG PO TABS: 40.0000 mg | ORAL_TABLET | Freq: Every day | ORAL | 1 refills | Status: AC

## 2022-08-24 MED ORDER — SODIUM CHLORIDE 0.9 % IV SOLN
200.0000 mg | Freq: Once | INTRAVENOUS | Status: AC
  Administered 2022-08-24: 200 mg via INTRAVENOUS
  Filled 2022-08-24: qty 200

## 2022-08-24 MED ORDER — METHYLPREDNISOLONE NA SUC (PF) 125 MG IJ SOLR
20.0000 mg | Freq: Once | INTRAMUSCULAR | Status: AC | PRN
  Administered 2022-08-24: 20 mg via INTRAVENOUS

## 2022-08-24 NOTE — Patient Instructions (Signed)

## 2022-08-24 NOTE — Progress Notes (Signed)
Pt tolerated treatment without complaints.  VSS.  Pt refused 30 minute post observation.  Pt understands risks.   

## 2022-08-25 DIAGNOSIS — I480 Paroxysmal atrial fibrillation: Secondary | ICD-10-CM | POA: Diagnosis not present

## 2022-08-25 DIAGNOSIS — J9622 Acute and chronic respiratory failure with hypercapnia: Secondary | ICD-10-CM | POA: Diagnosis not present

## 2022-08-25 DIAGNOSIS — I110 Hypertensive heart disease with heart failure: Secondary | ICD-10-CM | POA: Diagnosis not present

## 2022-08-25 DIAGNOSIS — J9621 Acute and chronic respiratory failure with hypoxia: Secondary | ICD-10-CM | POA: Diagnosis not present

## 2022-08-25 DIAGNOSIS — E1142 Type 2 diabetes mellitus with diabetic polyneuropathy: Secondary | ICD-10-CM | POA: Diagnosis not present

## 2022-08-25 DIAGNOSIS — G903 Multi-system degeneration of the autonomic nervous system: Secondary | ICD-10-CM | POA: Diagnosis not present

## 2022-08-26 ENCOUNTER — Ambulatory Visit (INDEPENDENT_AMBULATORY_CARE_PROVIDER_SITE_OTHER): Payer: Medicare Other | Admitting: Podiatry

## 2022-08-26 DIAGNOSIS — L600 Ingrowing nail: Secondary | ICD-10-CM

## 2022-08-26 DIAGNOSIS — L601 Onycholysis: Secondary | ICD-10-CM

## 2022-08-26 NOTE — Progress Notes (Signed)
Subjective: Alexandria [name] is a 87 y.o.  female returns to office today for follow up evaluation after having left Hallux total nail ingrown removal total avulsion approximately 2 weeks ago. Patient has been soaking using epsom salts and applying topical antibiotic covered with bandaid daily. Patient denies fevers, chills, nausea, vomiting. Denies any calf pain, chest pain, SOB.   Objective:  Vitals: Reviewed  General: Well developed, nourished, in no acute distress, alert and oriented x3   Dermatology: Skin is warm, dry and supple bilateral. left hallux nail bed appears to be clean, dry, with mild granular tissue and surrounding scab. There is no surrounding erythema, edema, drainage/purulence. The remaining nails appear unremarkable at this time. There are no other lesions or other signs of infection present.  Neurovascular status: Intact. No lower extremity swelling; No pain with calf compression bilateral.  Musculoskeletal: Decreased tenderness to palpation of the left hallux nail. Muscular strength within normal limits bilateral.   Assesement and Plan: S/p total nail avulsion to the  left hallux nail total, doing well.   -Continue soaking in epsom salts twice a day followed by antibiotic ointment and a band-aid. Can leave uncovered at night. Continue this until completely healed.  -If the area has not healed in 2 weeks, call the office for follow-up appointment, or sooner if any problems arise.  -Monitor for any signs/symptoms of infection. Call the office immediately if any occur or go directly to the emergency room. Call with any questions/concerns.        Corinna Gab, DPM [facility] / [facility]                   08/26/2022

## 2022-08-27 ENCOUNTER — Other Ambulatory Visit: Payer: Self-pay | Admitting: *Deleted

## 2022-08-27 ENCOUNTER — Encounter: Payer: Self-pay | Admitting: Nurse Practitioner

## 2022-08-27 DIAGNOSIS — I480 Paroxysmal atrial fibrillation: Secondary | ICD-10-CM | POA: Diagnosis not present

## 2022-08-27 DIAGNOSIS — J9622 Acute and chronic respiratory failure with hypercapnia: Secondary | ICD-10-CM | POA: Diagnosis not present

## 2022-08-27 DIAGNOSIS — I110 Hypertensive heart disease with heart failure: Secondary | ICD-10-CM | POA: Diagnosis not present

## 2022-08-27 DIAGNOSIS — G903 Multi-system degeneration of the autonomic nervous system: Secondary | ICD-10-CM | POA: Diagnosis not present

## 2022-08-27 DIAGNOSIS — E1142 Type 2 diabetes mellitus with diabetic polyneuropathy: Secondary | ICD-10-CM | POA: Diagnosis not present

## 2022-08-27 DIAGNOSIS — J9621 Acute and chronic respiratory failure with hypoxia: Secondary | ICD-10-CM | POA: Diagnosis not present

## 2022-08-27 DIAGNOSIS — D649 Anemia, unspecified: Secondary | ICD-10-CM

## 2022-08-30 ENCOUNTER — Inpatient Hospital Stay: Payer: Medicare Other

## 2022-08-30 DIAGNOSIS — N183 Chronic kidney disease, stage 3 unspecified: Secondary | ICD-10-CM | POA: Diagnosis not present

## 2022-08-30 DIAGNOSIS — J449 Chronic obstructive pulmonary disease, unspecified: Secondary | ICD-10-CM | POA: Diagnosis not present

## 2022-08-30 DIAGNOSIS — D649 Anemia, unspecified: Secondary | ICD-10-CM

## 2022-08-30 DIAGNOSIS — I5032 Chronic diastolic (congestive) heart failure: Secondary | ICD-10-CM | POA: Diagnosis not present

## 2022-08-30 DIAGNOSIS — J9610 Chronic respiratory failure, unspecified whether with hypoxia or hypercapnia: Secondary | ICD-10-CM | POA: Diagnosis not present

## 2022-08-30 DIAGNOSIS — Z85118 Personal history of other malignant neoplasm of bronchus and lung: Secondary | ICD-10-CM | POA: Diagnosis not present

## 2022-08-30 DIAGNOSIS — D509 Iron deficiency anemia, unspecified: Secondary | ICD-10-CM | POA: Diagnosis not present

## 2022-08-30 LAB — CBC WITH DIFFERENTIAL (CANCER CENTER ONLY)
Abs Immature Granulocytes: 0.03 10*3/uL (ref 0.00–0.07)
Basophils Absolute: 0 10*3/uL (ref 0.0–0.1)
Basophils Relative: 1 %
Eosinophils Absolute: 0.1 10*3/uL (ref 0.0–0.5)
Eosinophils Relative: 1 %
HCT: 29.4 % — ABNORMAL LOW (ref 36.0–46.0)
Hemoglobin: 8.8 g/dL — ABNORMAL LOW (ref 12.0–15.0)
Immature Granulocytes: 1 %
Lymphocytes Relative: 16 %
Lymphs Abs: 1 10*3/uL (ref 0.7–4.0)
MCH: 30.3 pg (ref 26.0–34.0)
MCHC: 29.9 g/dL — ABNORMAL LOW (ref 30.0–36.0)
MCV: 101.4 fL — ABNORMAL HIGH (ref 80.0–100.0)
Monocytes Absolute: 0.4 10*3/uL (ref 0.1–1.0)
Monocytes Relative: 7 %
Neutro Abs: 4.6 10*3/uL (ref 1.7–7.7)
Neutrophils Relative %: 74 %
Platelet Count: 150 10*3/uL (ref 150–400)
RBC: 2.9 MIL/uL — ABNORMAL LOW (ref 3.87–5.11)
RDW: 17.3 % — ABNORMAL HIGH (ref 11.5–15.5)
WBC Count: 6.2 10*3/uL (ref 4.0–10.5)
nRBC: 0 % (ref 0.0–0.2)

## 2022-08-30 LAB — SAMPLE TO BLOOD BANK

## 2022-08-31 ENCOUNTER — Ambulatory Visit (INDEPENDENT_AMBULATORY_CARE_PROVIDER_SITE_OTHER): Payer: Medicare Other | Admitting: Physician Assistant

## 2022-08-31 ENCOUNTER — Encounter: Payer: Self-pay | Admitting: Physician Assistant

## 2022-08-31 VITALS — BP 95/50 | HR 64 | Temp 98.3°F | Resp 18 | Ht 65.0 in | Wt 225.0 lb

## 2022-08-31 DIAGNOSIS — I890 Lymphedema, not elsewhere classified: Secondary | ICD-10-CM

## 2022-08-31 DIAGNOSIS — I872 Venous insufficiency (chronic) (peripheral): Secondary | ICD-10-CM | POA: Diagnosis not present

## 2022-08-31 NOTE — Progress Notes (Addendum)
Office Note   History of Present Illness   Alexandria [name] is a 87 y.o. (02-Jun-1935) female who presents for repeat evaluation of bilateral lower extremity swelling.  She has been experiencing bilateral lower extremity swelling for the past 2 years that waxes and wanes in severity.  She has a history of small venous ulcerations with lower extremity weeping, which has required unna boots once. It was felt she has lymphedema superimposed on venous insufficiency.  She also has a history of CHF which contributes to her leg swelling too.  She was recommended to continue her diuretics as prescribed by her cardiologist and wear thigh-high compression stockings. We also prescribed her lymphedema pumps.  She returns today as a follow up. She is still having bilateral lower extremity swelling.  Occasionally she has weeping from her lower legs.  She has no active venous ulcerations.  She has tried to wear compression stockings with minimal benefit.  At one point her legs became so swollen she could not get her compression stockings past her ankle.  She somewhat benefits from the use of diuretics and is cleared to increase her dosage of torsemide as needed for extra swelling.  Unfortunately she has not gotten her lymphedema pumps due to issues with her previous distributor.  Past Medical History:  Diagnosis Date   Allergy, unspecified not elsewhere classified    Anxiety    Aortic stenosis, mild 03/30/2022   a. TTE 03/29/2022: EF 55-60, no RWMA, GR 1 DD, normal RVSF, mild aortic stenosis (mean 10, V-max 212 cm/s, DI 0.70)   Cataract    Chronic diastolic congestive heart failure (HCC)    a. 06/2022 Echo: EF 60-65%, no rwma, nl RV fxn, mild MR, mild AS.   COPD (chronic obstructive pulmonary disease) (HCC) 03/08/1998   PFTs 12/12/2002 FEV 1 1.42 (64%) ratio 58 with no better after B2 and DLCO75%; PFTs 11/20/09 FEV1 1.50 (73%) ratio 50 no better after B2 with DLCO 62%; Hfa 50% 11/20/2009 >75%, 01/13/10 p  coaching   Coronary artery calcification seen on CT scan    a. 2012 Neg stress test.   Depression 12/07/1998   Diabetes mellitus type II 02/05/2006   Dr. Elvera Lennox with endo   Disorders of bursae and tendons in shoulder region, unspecified    Rotator cuff syndrome, right   E. coli bacteremia    Esophagitis    GERD (gastroesophageal reflux disease)    History of UTI    Hyperlipidemia 10/04/2000   Hypertension 03/08/1992   Hypothyroidism 03/08/1968   Iron deficiency anemia    Lung nodule    radiation starts 10-08-2019   Malignant neoplasm of right upper lobe of lung (HCC) 07/24/2019   Obesity    NOS   OSA (obstructive sleep apnea)    PSG 01/27/10 AHI 13, pt does not know CPAP settings   PAF (paroxysmal atrial fibrillation) (HCC)    a. 06/2022 in setting of UTI/encephalopathy-->converted on amio; b. CHA2DS2VASc = 6-->No OAC 2/2 chronic anemia/fall risk; c. 06/2022 Zio: Predominantly sinus bradycardia at 54 (3-158)'s.  1 brief run of SVT.  2.2% PVC burden.   Peripheral neuropathy    Likely due to DM per Dr. Tresa Endo hernia     Past Surgical History:  Procedure Laterality Date   ABD U/S  03/19/1999   Nml x2 foci in liver   ADENOSINE MYOVIEW  06/02/2007   Nml   CARDIOLITE PERSANTINE  08/24/2000   Nml  CAROTID U/S  08/24/2000   1-39% ICA stenosis   CAROTID U/S  06/02/2007   No apprec change    CARPAL TUNNEL RELEASE  12/1997   Right   CESAREAN SECTION     x2 Breech/ repeat   CHOLECYSTECTOMY  1997   COLONOSCOPY WITH PROPOFOL N/A 02/12/2016   Procedure: COLONOSCOPY WITH PROPOFOL;  Surgeon: Rachael Fee, MD;  Location: [facility];  Service: Endoscopy;  Laterality: N/A;   CT ABD W & PELVIS WO/W CM     Abd hemangiomas of liver, 1 cm R renal cyst   DENTAL SURGERY  2016   Implants   DEXA  07/03/2003   Nml   ESOPHAGOGASTRODUODENOSCOPY  12/05/1997   Nml (due to hoarseness)   ESOPHAGOGASTRODUODENOSCOPY (EGD) WITH PROPOFOL N/A 02/12/2016   Procedure:  ESOPHAGOGASTRODUODENOSCOPY (EGD) WITH PROPOFOL;  Surgeon: Rachael Fee, MD;  Location: [facility];  Service: Endoscopy;  Laterality: N/A;   GALLBLADDER SURGERY     HERNIA REPAIR  01/24/2009   Lap Ventr w/ Lysis of adhesions (Dr. Dwain Sarna)   knee arthroscopic surgery  years ago   right   ROTATOR CUFF REPAIR  1984   Right, [place]   SHOULDER OPEN ROTATOR CUFF REPAIR  02/08/2012   Procedure: ROTATOR CUFF REPAIR SHOULDER OPEN;  Surgeon: Drucilla Schmidt, MD;  Location: [facility];  Service: Orthopedics;  Laterality: Left;  Left Shoulder Open Anterior Acrominectomy Rotator Cuff Repair Open Distal Clavicle Resection ,tissue mend graft, and repair of biceps tendon   THUMB RELEASE  12/1997   Right   TONSILLECTOMY     TOTAL ABDOMINAL HYSTERECTOMY  1985   Due to dysmennorhea   US ECHOCARDIOGRAPHY  06/02/2007    Social History   Socioeconomic History   Marital status: Widowed    Spouse name: Not on file   Number of children: 2   Years of education: Not on file   Highest education level: Not on file  Occupational History   Occupation: Retired    Associate Professor: RETIRED  Tobacco Use   Smoking status: Former    Packs/day: 2.00    Years: 50.00    Additional pack years: 0.00    Total pack years: 100.00    Types: Cigarettes    Quit date: 02/05/2005    Years since quitting: 17.5   Smokeless tobacco: Never  Vaping Use   Vaping Use: Never used  Substance and Sexual Activity   Alcohol use: Yes    Comment: occasionally   Drug use: Never   Sexual activity: Not Currently  Other Topics Concern   Not on file  Social History Narrative   Widowed 2023, 2 children; Enjoys painting      Quit smoking in 2007; no alcohol; lives in [place]; [name]/d lives in [place]. Had own business. Dont drive- sec to neuropathy.    Social Determinants of Health   Financial Resource Strain: Not on file  Food Insecurity: No Food Insecurity (06/30/2022)   Hunger Vital Sign    Worried About Running Out of Food  in the Last Year: Never true    Ran Out of Food in the Last Year: Never true  Transportation Needs: No Transportation Needs (06/30/2022)   PRAPARE - Administrator, Civil Service (Medical): No    Lack of Transportation (Non-Medical): No  Physical Activity: Not on file  Stress: Not on file  Social Connections: Not on file  Intimate Partner Violence: Not At Risk (06/30/2022)   Humiliation, Afraid, Rape, and Kick questionnaire  Fear of Current or Ex-Partner: No    Emotionally Abused: No    Physically Abused: No    Sexually Abused: No    Family History  Problem Relation Age of Onset   Heart disease Mother    Thyroid disease Mother    Emphysema Father        One lung   Cystic fibrosis Sister    Hyperthyroidism Sister    Osteoarthritis Brother    Hyperthyroidism Brother    Esophageal cancer Neg Hx    Cancer Neg Hx        Head or neck   Colon cancer Neg Hx    Stomach cancer Neg Hx    Breast cancer Neg Hx     Current Outpatient Medications  Medication Sig Dispense Refill   acetaminophen (TYLENOL) 325 MG tablet Take 2 tablets (650 mg total) by mouth every 6 (six) hours as needed for mild pain, headache, fever or moderate pain.     albuterol (PROAIR HFA) 108 (90 Base) MCG/ACT inhaler Inhale 2 puffs into the lungs every 6 (six) hours as needed for wheezing or shortness of breath. INHALE 2 PUFFS INTO THE LUNGS EVERY 6 HOURS AS NEEDED FOR WHEEZING OR SHORTNESS OF BREATH 3 Inhaler 3   amiodarone (PACERONE) 200 MG tablet Take 1 tablet (200 mg total) by mouth daily. 90 tablet 1   arformoterol (BROVANA) 15 MCG/2ML NEBU Take 2 mLs (15 mcg total) by nebulization at bedtime. 120 mL 6   bisoprolol (ZEBETA) 5 MG tablet Take 0.5 tablets (2.5 mg total) by mouth daily. 45 tablet 1   budesonide (PULMICORT) 0.5 MG/2ML nebulizer solution Take 2 mLs (0.5 mg total) by nebulization in the morning and at bedtime. 360 mL 3   busPIRone (BUSPAR) 15 MG tablet Take 15 mg by mouth 3 (three) times  daily.     Cholecalciferol (VITAMIN D) 50 MCG (2000 UT) tablet Take 1 tablet (2,000 Units total) by mouth daily.     Coenzyme Q-10 200 MG CAPS Take 200 mg by mouth daily.     Cyanocobalamin (B-12) 5000 MCG CAPS Take 5,000 mcg by mouth daily.     doxycycline (VIBRAMYCIN) 100 MG capsule Take 1 capsule (100 mg total) by mouth 2 (two) times daily. 14 capsule 0   esomeprazole (NEXIUM) 40 MG capsule Take 1 capsule (40 mg total) by mouth daily at 12 noon. 30 capsule 11   ezetimibe (ZETIA) 10 MG tablet TAKE 1 TABLET BY MOUTH EVERYDAY AT BEDTIME 90 tablet 2   ferrous sulfate 325 (65 FE) MG tablet TAKE 1 TABLET BY MOUTH EVERY DAY 90 tablet 3   FLUoxetine (PROZAC) 20 MG tablet Take 20 mg by mouth 2 (two) times daily.     glucose blood (ONETOUCH VERIO) test strip USE AS INSTRUCTED TO CHECK SUGAR 1 TIME DAILY 100 strip 3   guaiFENesin (MUCINEX) 600 MG 12 hr tablet Take 2 tablets (1,200 mg total) by mouth 2 (two) times daily as needed for cough or to loosen phlegm.     isosorbide mononitrate (IMDUR) 30 MG 24 hr tablet Take 30 mg by mouth daily.     levothyroxine (SYNTHROID) 125 MCG tablet Take 1 tablet (125 mcg total) by mouth daily. 90 tablet 3   magnesium 30 MG tablet Take 1 tablet (30 mg total) by mouth 2 (two) times daily. 180 tablet 3   melatonin 5 MG TABS Take 1 tablet (5 mg total) by mouth at bedtime.  0   metFORMIN (GLUCOPHAGE) 500 MG tablet  Take 1 tablet (500 mg total) by mouth every evening. 90 tablet 3   nitroGLYCERIN (NITROSTAT) 0.4 MG SL tablet Place 1 tablet (0.4 mg total) under the tongue every 5 (five) minutes as needed for chest pain. 25 tablet 3   nystatin (MYCOSTATIN) 100000 UNIT/ML suspension Take 5 mLs (500,000 Units total) by mouth 4 (four) times daily. 120 mL 0   OneTouch Delica Lancets 33G MISC Use 1x a day 100 each 3   OXYGEN Inhale into the lungs. 2lpm     potassium chloride (KLOR-CON) 10 MEQ tablet Take 1 tablet (10 mEq total) by mouth daily. 90 tablet 3   rosuvastatin (CRESTOR) 10  MG tablet TAKE 1 TABLET BY MOUTH EVERY OTHER DAY 45 tablet 2   Spacer/Aero Chamber Mouthpiece MISC Use with  inhaler as needed.  J44.9 1 each 0   torsemide (DEMADEX) 20 MG tablet Take 2 tablets (40 mg total) by mouth daily. Take an additional 20 mg daily as needed for weight greater then 230 pounds 270 tablet 1   Zinc Oxide (TRIPLE PASTE) 12.8 % ointment Apply 1 Application topically as needed for irritation. Every shift.     No current facility-administered medications for this visit.    Allergies  Allergen Reactions   Antihistamines, Diphenhydramine-Type Other (See Comments)    Able to tolerate only allegra.    Incruse Ellipta [Umeclidinium Bromide] Cough    Caused voice change and severe coughing   Clarithromycin Other (See Comments)    REACTION: diff swallowing and mouth blisters   Codeine Other (See Comments)    Unknown    Diphenhydramine    Pregabalin Other (See Comments)    Lack of effect for neuropathy pain.   Sulfonamide Derivatives    Tiotropium Bromide Monohydrate Other (See Comments)    Voice changes   Umeclidinium    Vraylar [Cariprazine]     intolerant    REVIEW OF SYSTEMS (negative unless checked):   Cardiac:  []  Chest pain or chest pressure? []  Shortness of breath upon activity? []  Shortness of breath when lying flat? []  Irregular heart rhythm?  Vascular:  []  Pain in calf, thigh, or hip brought on by walking? []  Pain in feet at night that wakes you up from your sleep? []  Blood clot in your veins? [x]  Leg swelling?  Pulmonary:  [x]  Oxygen at home? []  Productive cough? []  Wheezing?  Neurologic:  []  Sudden weakness in arms or legs? []  Sudden numbness in arms or legs? []  Sudden onset of difficult speaking or slurred speech? []  Temporary loss of vision in one eye? []  Problems with dizziness?  Gastrointestinal:  []  Blood in stool? []  Vomited blood?  Genitourinary:  []  Burning when urinating? []  Blood in urine?  Psychiatric:  []  Major  depression  Hematologic:  []  Bleeding problems? []  Problems with blood clotting?  Dermatologic:  []  Rashes or ulcers?  Constitutional:  []  Fever or chills?  Ear/Nose/Throat:  []  Change in hearing? []  Nose bleeds? []  Sore throat?  Musculoskeletal:  []  Back pain? []  Joint pain? []  Muscle pain?   Physical Examination     Vitals:   08/31/22 1517  BP: (!) 95/50  Pulse: 64  Resp: 18  Temp: 98.3 F (36.8 C)  TempSrc: Temporal  SpO2: 95%  Weight: 225 lb (102.1 kg)  Height: 5\' 5"  (1.651 m)   Body mass index is 37.44 kg/m.  General:  WDWN in NAD; vital signs documented above Gait: Not observed HENT: WNL, normocephalic Pulmonary: normal non-labored breathing on supplemental oxygen  Cardiac: regular Abdomen: soft, NT, no masses Skin: without rashes Vascular Exam/Pulses: BLE well perfused Extremities: bilateral lower extremities edematous, R > L. Skin changes to the lower extremities including lymphorrhea, stasis pigmentation, hyperplasia, papillomatosis, and hyperkeratosis Musculoskeletal: no muscle wasting or atrophy  Neurologic: A&O X 3;  No focal weakness or paresthesias are detected Psychiatric:  The pt has Normal affect   Medical Decision Making   Alexandria [name] is a 87 y.o. female who presents with bilateral lower extremity swelling  The patient has been seen multiple times before for her bilateral lower extremity swelling.  She likely has lymphedema that is superimposed on venous insufficiency She has had lower extremity swelling for 2 years. She has tried elevating her legs but cannot lay flat due to her CHF and requiring home oxygen. She has also tried thigh high compression stockings and experienced minimal benefit. Sometimes her legs get so swollen she can't get the stockings past her ankles.  She has extensive bilateral lower extremity edema with skin changes including lymphorrhea, hyperkeratosis, hyperplasia, papillomatosis, and previous ulcerations. She  has tried elevation and compression for multiple months with minimal benefit.  She has previously been prescribed lymphedema pumps, however this process was abandoned after distributor issues. I am recommending lymphedema pumps again. We will start this process with a new distributor. She should continue leg elevation as tolerated, compression stockings, diuretics, and lymphedema pumps to help with her swelling. She can follow up with our office as needed.  Right lower extremity measurements on 06/25/2022:  Ankle: 33.4cm Calf: 38.2cm Thigh: 51.6cm  Left lower extremity measurements on 06/25/2022:  Ankle: 35cm Calf: 41.3cm Thigh: 52.6cm  Right lower extremity measurements on 07/29/2022:  Ankle: 34.1cm Calf: 43.9cm Thigh: 57.1cm  Left lower extremity measurements on 07/29/2022:  Ankle: 35.1cm Calf: 41.9cm Thigh: 65.3cm   Patient has tried 4 weeks of elevation, exercise, compression which have not revealed swelling.  Patient presents with bilateral lower extremity lymphedema with hyperpigmentation.  Recommending lymphedema pump for long-term, and home use.  Aadarsh Cozort Sharin Mons, PA-C [facility] Office: [phone]  08/31/2022, 3:22 PM  Clinic MD: Steve Rattler

## 2022-09-01 DIAGNOSIS — J9621 Acute and chronic respiratory failure with hypoxia: Secondary | ICD-10-CM | POA: Diagnosis not present

## 2022-09-01 DIAGNOSIS — J9622 Acute and chronic respiratory failure with hypercapnia: Secondary | ICD-10-CM | POA: Diagnosis not present

## 2022-09-01 DIAGNOSIS — E1142 Type 2 diabetes mellitus with diabetic polyneuropathy: Secondary | ICD-10-CM | POA: Diagnosis not present

## 2022-09-01 DIAGNOSIS — I480 Paroxysmal atrial fibrillation: Secondary | ICD-10-CM | POA: Diagnosis not present

## 2022-09-01 DIAGNOSIS — G903 Multi-system degeneration of the autonomic nervous system: Secondary | ICD-10-CM | POA: Diagnosis not present

## 2022-09-01 DIAGNOSIS — I110 Hypertensive heart disease with heart failure: Secondary | ICD-10-CM | POA: Diagnosis not present

## 2022-09-03 ENCOUNTER — Ambulatory Visit (INDEPENDENT_AMBULATORY_CARE_PROVIDER_SITE_OTHER): Payer: Medicare Other | Admitting: Gastroenterology

## 2022-09-03 ENCOUNTER — Encounter: Payer: Self-pay | Admitting: Gastroenterology

## 2022-09-03 VITALS — BP 124/70 | HR 61 | Ht 65.0 in

## 2022-09-03 DIAGNOSIS — I5032 Chronic diastolic (congestive) heart failure: Secondary | ICD-10-CM

## 2022-09-03 DIAGNOSIS — J9611 Chronic respiratory failure with hypoxia: Secondary | ICD-10-CM

## 2022-09-03 DIAGNOSIS — D509 Iron deficiency anemia, unspecified: Secondary | ICD-10-CM

## 2022-09-03 NOTE — Patient Instructions (Signed)
_______________________________________________________  If your blood pressure at your visit was 140/90 or greater, please contact your primary care physician to follow up on this.  _______________________________________________________  If you are age 85 or older, your body mass index should be between 23-30. Your Body mass index is 37.44 kg/m. If this is out of the aforementioned range listed, please consider follow up with your Primary Care Provider.  If you are age 43 or younger, your body mass index should be between 19-25. Your Body mass index is 37.44 kg/m. If this is out of the aformentioned range listed, please consider follow up with your Primary Care Provider.   ________________________________________________________  The [facility] providers would like to encourage you to use [facility] to communicate with providers for non-urgent requests or questions.  Due to long hold times on the telephone, sending your provider a message by [facility] may be a faster and more efficient way to get a response.  Please allow 48 business hours for a response.  Please remember that this is for non-urgent requests.   It was a pleasure to see you today!  Thank you for trusting me with your gastrointestinal care!    Scott E.Tomasa Rand, MD

## 2022-09-03 NOTE — Progress Notes (Signed)
HPI : Alexandria [name] is a 87 y.o. female with an extensive medical history to include severe COPD on chronic supplemental oxygen, heart failure with preserved ejection fraction, diabetes, stage III CKD, morbid obesity, sleep apnea and lung cancer who is referred to [place] by Joaquim Nam, MD for further evaluation of chronic iron deficiency anemia.  The patient has had problems with iron deficiency anemia for at least 10 years.  She underwent an EGD and colonoscopy because of iron deficiency anemia in 2017.  No abnormalities were found to explain her iron deficiency anemia.  She is continue to require iron supplementation, and has recently been refractory to oral iron and required multiple IV iron infusions as well as blood transfusions.  The patient had a positive fecal occult blood test back in 2021.  She was last seen in our office in 2023 and it was felt that due to her significant comorbidities further endoscopic evaluation likely pose more risk than benefit.   Her hemoglobin seem to be stable around 9 until it dropped to 7.4 on June's third.  Her most recent hemoglobin was 8.8 on June 24.  Her iron indices have been stable, in the setting of recurrent iron infusions.  She received a blood transfusion on May 2. The patient reports that her stools look consistently brown.  She is very attuned to her stool appearance due to her problems with iron deficiency and denies seeing any black or tarry stools.  No bright red blood.  She has regular bowel movements, usually twice per day.  She denies any symptoms of nausea, vomiting or decreased appetite.  She has occasional acid reflux and takes Nexium daily.  She was admitted to the hospital from April 23 to May 1 with altered mental status and found to have acute on chronic respiratory failure, A-fib with RVR and acute on chronic diastolic heart failure.  She was treated with steroids and nebulizers for her COPD and given IV Lasix for her volume  overload.   The patient lives alone.  She is recently widowed.  She gets very short of breath with minimal physical activity.  She has lots of assistance at home from people that come and help manage the house, prepare meals etc. Her daughter lives in [place] and comes every 3 weeks to manage her medications.    02/12/2016 EGD and colonoscopy for iron deficiency anemia.  EGD with a small hiatal hernia and otherwise normal.  Colonoscopy with sigmoid diverticulosis and the lipoma in the sigmoid colon and internal hemorrhoids.    08/30/2019 patient seen in clinic by Alcide Evener for iron deficiency anemia.  At that time discussed she had previously been told to continue p.o. iron once daily indefinitely due to her multiple comorbidities.  At time of that visit patient recently diagnosed with stage I lung cancer.  She had iron deficiency anemia and positive FOBT.  Her reflux is well controlled on Nexium 40 mg once daily.  She continued on Ferrous sulfate 325 mg p.o. daily.  At that time repeated CBC.  Decided against invasive endoscopic/small bowel pill capsule endoscopy unless she demonstrated significant active GI bleed or worsening anemia.    03/18/2021 CT of the abdomen pelvis with contrast showed mild wall thickening of the proximal duodenum at least in part related to underdistention and may reflect peptic ulcer disease or duodenitis.    07/24/2021 office visit with hematology/oncology for chronic intermittent anemia.  Noted a CT scan in January 2023 which showed  duodenal thickening recommended GI eval.    08/06/2021 CBC with a hemoglobin of 8.0 (7.1 on 07/24/2021).  Iron normal at 69.   Component Ref Range & Units 3 wk ago (08/09/22) 2 mo ago (06/29/22) 2 mo ago (06/16/22) 5 mo ago (03/10/22) 8 mo ago (12/30/21) 11 mo ago (09/23/21) 1 yr ago (03/25/21)  Ferritin 11 - 307 ng/mL 17 206 CM 26 CM 101 CM 35 CM 10 Low  CM 14 CM   Past Medical History:  Diagnosis Date   Allergy, unspecified not  elsewhere classified    Anxiety    Aortic stenosis, mild 03/30/2022   a. TTE 03/29/2022: EF 55-60, no RWMA, GR 1 DD, normal RVSF, mild aortic stenosis (mean 10, V-max 212 cm/s, DI 0.70)   Cataract    Chronic diastolic congestive heart failure (HCC)    a. 06/2022 Echo: EF 60-65%, no rwma, nl RV fxn, mild MR, mild AS.   COPD (chronic obstructive pulmonary disease) (HCC) 03/08/1998   PFTs 12/12/2002 FEV 1 1.42 (64%) ratio 58 with no better after B2 and DLCO75%; PFTs 11/20/09 FEV1 1.50 (73%) ratio 50 no better after B2 with DLCO 62%; Hfa 50% 11/20/2009 >75%, 01/13/10 p coaching   Coronary artery calcification seen on CT scan    a. 2012 Neg stress test.   Depression 12/07/1998   Diabetes mellitus type II 02/05/2006   Dr. Elvera Lennox with endo   Disorders of bursae and tendons in shoulder region, unspecified    Rotator cuff syndrome, right   E. coli bacteremia    Esophagitis    GERD (gastroesophageal reflux disease)    History of UTI    Hyperlipidemia 10/04/2000   Hypertension 03/08/1992   Hypothyroidism 03/08/1968   Iron deficiency anemia    Lung nodule    radiation starts 10-08-2019   Malignant neoplasm of right upper lobe of lung (HCC) 07/24/2019   Obesity    NOS   OSA (obstructive sleep apnea)    PSG 01/27/10 AHI 13, pt does not know CPAP settings   PAF (paroxysmal atrial fibrillation) (HCC)    a. 06/2022 in setting of UTI/encephalopathy-->converted on amio; b. CHA2DS2VASc = 6-->No OAC 2/2 chronic anemia/fall risk; c. 06/2022 Zio: Predominantly sinus bradycardia at 54 (3-158)'s.  1 brief run of SVT.  2.2% PVC burden.   Peripheral neuropathy    Likely due to DM per Dr. Tresa Endo hernia      Past Surgical History:  Procedure Laterality Date   ABD U/S  03/19/1999   Nml x2 foci in liver   ADENOSINE MYOVIEW  06/02/2007   Nml   CARDIOLITE PERSANTINE  08/24/2000   Nml   CAROTID U/S  08/24/2000   1-39% ICA stenosis   CAROTID U/S  06/02/2007   No apprec change    CARPAL TUNNEL RELEASE   12/1997   Right   CESAREAN SECTION     x2 Breech/ repeat   CHOLECYSTECTOMY  1997   COLONOSCOPY WITH PROPOFOL N/A 02/12/2016   Procedure: COLONOSCOPY WITH PROPOFOL;  Surgeon: Rachael Fee, MD;  Location: [facility];  Service: Endoscopy;  Laterality: N/A;   CT ABD W & PELVIS WO/W CM     Abd hemangiomas of liver, 1 cm R renal cyst   DENTAL SURGERY  2016   Implants   DEXA  07/03/2003   Nml   ESOPHAGOGASTRODUODENOSCOPY  12/05/1997   Nml (due to hoarseness)   ESOPHAGOGASTRODUODENOSCOPY (EGD) WITH PROPOFOL N/A 02/12/2016   Procedure: ESOPHAGOGASTRODUODENOSCOPY (EGD) WITH PROPOFOL;  Surgeon:  Rachael Fee, MD;  Location: [facility];  Service: Endoscopy;  Laterality: N/A;   GALLBLADDER SURGERY     HERNIA REPAIR  01/24/2009   Lap Ventr w/ Lysis of adhesions (Dr. Dwain Sarna)   knee arthroscopic surgery  years ago   right   ROTATOR CUFF REPAIR  1984   Right, [place]   SHOULDER OPEN ROTATOR CUFF REPAIR  02/08/2012   Procedure: ROTATOR CUFF REPAIR SHOULDER OPEN;  Surgeon: Drucilla Schmidt, MD;  Location: [facility];  Service: Orthopedics;  Laterality: Left;  Left Shoulder Open Anterior Acrominectomy Rotator Cuff Repair Open Distal Clavicle Resection ,tissue mend graft, and repair of biceps tendon   THUMB RELEASE  12/1997   Right   TONSILLECTOMY     TOTAL ABDOMINAL HYSTERECTOMY  1985   Due to dysmennorhea   US ECHOCARDIOGRAPHY  06/02/2007   Family History  Problem Relation Age of Onset   Heart disease Mother    Thyroid disease Mother    Emphysema Father        One lung   Cystic fibrosis Sister    Hyperthyroidism Sister    Osteoarthritis Brother    Hyperthyroidism Brother    Esophageal cancer Neg Hx    Cancer Neg Hx        Head or neck   Colon cancer Neg Hx    Stomach cancer Neg Hx    Breast cancer Neg Hx    Social History   Tobacco Use   Smoking status: Former    Packs/day: 2.00    Years: 50.00    Additional pack years: 0.00    Total pack years: 100.00    Types:  Cigarettes    Quit date: 02/05/2005    Years since quitting: 17.5   Smokeless tobacco: Never  Vaping Use   Vaping Use: Never used  Substance Use Topics   Alcohol use: Yes    Comment: occasionally   Drug use: Never   Current Outpatient Medications  Medication Sig Dispense Refill   acetaminophen (TYLENOL) 325 MG tablet Take 2 tablets (650 mg total) by mouth every 6 (six) hours as needed for mild pain, headache, fever or moderate pain.     albuterol (PROAIR HFA) 108 (90 Base) MCG/ACT inhaler Inhale 2 puffs into the lungs every 6 (six) hours as needed for wheezing or shortness of breath. INHALE 2 PUFFS INTO THE LUNGS EVERY 6 HOURS AS NEEDED FOR WHEEZING OR SHORTNESS OF BREATH 3 Inhaler 3   amiodarone (PACERONE) 200 MG tablet Take 1 tablet (200 mg total) by mouth daily. 90 tablet 1   arformoterol (BROVANA) 15 MCG/2ML NEBU Take 2 mLs (15 mcg total) by nebulization at bedtime. 120 mL 6   bisoprolol (ZEBETA) 5 MG tablet Take 0.5 tablets (2.5 mg total) by mouth daily. 45 tablet 1   budesonide (PULMICORT) 0.5 MG/2ML nebulizer solution Take 2 mLs (0.5 mg total) by nebulization in the morning and at bedtime. 360 mL 3   busPIRone (BUSPAR) 15 MG tablet Take 15 mg by mouth 3 (three) times daily.     Cholecalciferol (VITAMIN D) 50 MCG (2000 UT) tablet Take 1 tablet (2,000 Units total) by mouth daily.     Coenzyme Q-10 200 MG CAPS Take 200 mg by mouth daily.     Cyanocobalamin (B-12) 5000 MCG CAPS Take 5,000 mcg by mouth daily.     doxycycline (VIBRAMYCIN) 100 MG capsule Take 1 capsule (100 mg total) by mouth 2 (two) times daily. 14 capsule 0   esomeprazole (NEXIUM) 40  MG capsule Take 1 capsule (40 mg total) by mouth daily at 12 noon. 30 capsule 11   ezetimibe (ZETIA) 10 MG tablet TAKE 1 TABLET BY MOUTH EVERYDAY AT BEDTIME 90 tablet 2   ferrous sulfate 325 (65 FE) MG tablet TAKE 1 TABLET BY MOUTH EVERY DAY 90 tablet 3   FLUoxetine (PROZAC) 20 MG tablet Take 20 mg by mouth 2 (two) times daily.     glucose  blood (ONETOUCH VERIO) test strip USE AS INSTRUCTED TO CHECK SUGAR 1 TIME DAILY 100 strip 3   guaiFENesin (MUCINEX) 600 MG 12 hr tablet Take 2 tablets (1,200 mg total) by mouth 2 (two) times daily as needed for cough or to loosen phlegm.     isosorbide mononitrate (IMDUR) 30 MG 24 hr tablet Take 30 mg by mouth daily.     levothyroxine (SYNTHROID) 125 MCG tablet Take 1 tablet (125 mcg total) by mouth daily. 90 tablet 3   magnesium 30 MG tablet Take 1 tablet (30 mg total) by mouth 2 (two) times daily. 180 tablet 3   melatonin 5 MG TABS Take 1 tablet (5 mg total) by mouth at bedtime.  0   metFORMIN (GLUCOPHAGE) 500 MG tablet Take 1 tablet (500 mg total) by mouth every evening. 90 tablet 3   nitroGLYCERIN (NITROSTAT) 0.4 MG SL tablet Place 1 tablet (0.4 mg total) under the tongue every 5 (five) minutes as needed for chest pain. 25 tablet 3   nystatin (MYCOSTATIN) 100000 UNIT/ML suspension Take 5 mLs (500,000 Units total) by mouth 4 (four) times daily. 120 mL 0   OneTouch Delica Lancets 33G MISC Use 1x a day 100 each 3   OXYGEN Inhale into the lungs. 2lpm     potassium chloride (KLOR-CON) 10 MEQ tablet Take 1 tablet (10 mEq total) by mouth daily. 90 tablet 3   rosuvastatin (CRESTOR) 10 MG tablet TAKE 1 TABLET BY MOUTH EVERY OTHER DAY 45 tablet 2   Spacer/Aero Chamber Mouthpiece MISC Use with  inhaler as needed.  J44.9 1 each 0   torsemide (DEMADEX) 20 MG tablet Take 2 tablets (40 mg total) by mouth daily. Take an additional 20 mg daily as needed for weight greater then 230 pounds 270 tablet 1   Zinc Oxide (TRIPLE PASTE) 12.8 % ointment Apply 1 Application topically as needed for irritation. Every shift.     No current facility-administered medications for this visit.   Allergies  Allergen Reactions   Antihistamines, Diphenhydramine-Type Other (See Comments)    Able to tolerate only allegra.    Incruse Ellipta [Umeclidinium Bromide] Cough    Caused voice change and severe coughing   Clarithromycin  Other (See Comments)    REACTION: diff swallowing and mouth blisters   Codeine Other (See Comments)    Unknown    Diphenhydramine    Pregabalin Other (See Comments)    Lack of effect for neuropathy pain.   Sulfonamide Derivatives    Tiotropium Bromide Monohydrate Other (See Comments)    Voice changes   Umeclidinium    Vraylar [Cariprazine]     intolerant     Review of Systems: All systems reviewed and negative except where noted in HPI.    No results found.  Physical Exam: BP 124/70   Pulse 61   Ht 5\' 5"  (1.651 m)   LMP  (LMP Unknown)   BMI 37.44 kg/m  Constitutional: Pleasant,well-developed, obese, deconditioned female in no acute distress.  Seated in wheelchair. HEENT: Normocephalic and atraumatic. Conjunctivae are normal. No  scleral icterus. Neck supple.  Cardiovascular: Normal rate, regular rhythm.  Pulmonary/chest: Effort normal and breath sounds distant. No wheezing, rales or rhonchi.  Oxygen supplementation via nasal cannula Abdominal: Soft, nondistended, nontender. Bowel sounds active throughout. There are no masses palpable. No hepatomegaly. Extremities: Bilateral lower extremities wrapped in compression stockings Neurological: Alert and oriented to person place and time. Psychiatric: Normal mood and affect. Behavior is normal.  CBC    Component Value Date/Time   WBC 6.2 08/30/2022 1306   WBC 6.4 08/09/2022 1305   RBC 2.90 (L) 08/30/2022 1306   HGB 8.8 (L) 08/30/2022 1306   HGB 10.7 (L) 05/23/2019 1141   HCT 29.4 (L) 08/30/2022 1306   HCT 32.9 (L) 05/23/2019 1141   PLT 150 08/30/2022 1306   PLT 180 05/23/2019 1141   MCV 101.4 (H) 08/30/2022 1306   MCV 95 05/23/2019 1141   MCH 30.3 08/30/2022 1306   MCHC 29.9 (L) 08/30/2022 1306   RDW 17.3 (H) 08/30/2022 1306   RDW 12.8 05/23/2019 1141   LYMPHSABS 1.0 08/30/2022 1306   MONOABS 0.4 08/30/2022 1306   EOSABS 0.1 08/30/2022 1306   BASOSABS 0.0 08/30/2022 1306    CMP     Component Value Date/Time    NA 143 08/19/2022 1300   NA 143 07/08/2022 0000   K 4.5 08/19/2022 1300   CL 99 08/19/2022 1300   CO2 36 (H) 08/19/2022 1300   GLUCOSE 118 (H) 08/19/2022 1300   BUN 33 (H) 08/19/2022 1300   BUN 40 (A) 07/08/2022 0000   CREATININE 1.33 (H) 08/19/2022 1300   CREATININE 0.86 01/25/2020 1450   CALCIUM 8.7 (L) 08/19/2022 1300   PROT 6.4 (L) 08/09/2022 1305   ALBUMIN 3.2 (L) 08/09/2022 1305   AST 15 08/09/2022 1305   ALT 15 08/09/2022 1305   ALKPHOS 73 08/09/2022 1305   BILITOT 0.3 08/09/2022 1305   GFRNONAA 39 (L) 08/19/2022 1300   GFRAA >60 12/03/2019 1212       Latest Ref Rng & Units 08/30/2022    1:06 PM 08/24/2022    1:15 PM 08/19/2022    1:00 PM  CBC EXTENDED  WBC 4.0 - 10.5 K/uL 6.2     RBC 3.87 - 5.11 MIL/uL 2.90     Hemoglobin 12.0 - 15.0 g/dL 8.8  8.5  8.2   HCT 78.2 - 46.0 % 29.4  28.3  27.9   Platelets 150 - 400 K/uL 150     NEUT# 1.7 - 7.7 K/uL 4.6     Lymph# 0.7 - 4.0 K/uL 1.0         ASSESSMENT AND PLAN: 87 year old female with extensive cardiopulmonary comorbidities to include severe COPD/chronic hypoxemic respiratory failure on supplemental oxygen, chronic heart failure with preserved ejection fraction, deconditioning, sleep apnea with chronic iron deficiency anemia of 10 years duration.  Exact etiology of her iron deficiency unclear.  Upper and lower endoscopy in 2017 were unrevealing.  She has not undergone a capsule endoscopy.  Her stools appear grossly normal, but she had a positive fecal occult blood test in 2021. I had a long discussion with the patient and her daughter (via telephone) about potential etiologies for her iron deficiency anemia to include AVMs, Cameron's lesions, mass lesion (less likely) and iron malabsorption.  We all agreed that a repeat sedated endoscopic evaluation likely posed more risk than benefit.  I did suggest a capsule endoscopy, as this would be a very low risk evaluation and would be able to potentially provide some answers as  to  where her anemia may be coming from and to rule out some causes. Although the patient was initially on board with the plan for a video capsule, the patient's daughter did not think it was necessary, as the treatment for most chronic sources of bleeding would involve a procedure, and she did not feel like the risks of the procedure outweigh the benefits.  I did not necessarily disagree with this, but did think that the capsule may please provide some reassurance and take out some of the uncertainty regarding the cause of her refractory iron deficiency anemia. In the end, we elected to defer doing a capsule endoscopy.  I did suggest that if the patient/daughter changes her mind, to contact us and we will get her set up for the procedure.  Severe iron deficiency anemia, refractory - Discussed VCE, declined for now - Continue monitoring/infusion/transfusion as needed per hematology  Conita Amenta E. Tomasa Rand, MD [facility]  I spent a total of 35 minutes reviewing the patient's medical record, interviewing and examining the patient, discussing her diagnosis and management of her condition going forward, and documenting in the medical record   Joaquim Nam, MD

## 2022-09-06 ENCOUNTER — Other Ambulatory Visit: Payer: Self-pay

## 2022-09-06 ENCOUNTER — Encounter: Payer: Self-pay | Admitting: Physician Assistant

## 2022-09-06 ENCOUNTER — Inpatient Hospital Stay: Payer: Medicare Other | Attending: Internal Medicine

## 2022-09-06 ENCOUNTER — Inpatient Hospital Stay (HOSPITAL_BASED_OUTPATIENT_CLINIC_OR_DEPARTMENT_OTHER): Payer: Medicare Other | Admitting: Physician Assistant

## 2022-09-06 ENCOUNTER — Inpatient Hospital Stay: Payer: Medicare Other

## 2022-09-06 VITALS — BP 136/55 | HR 65 | Temp 97.5°F | Resp 20

## 2022-09-06 VITALS — BP 126/47 | HR 61 | Temp 97.2°F | Wt 228.0 lb

## 2022-09-06 DIAGNOSIS — I130 Hypertensive heart and chronic kidney disease with heart failure and stage 1 through stage 4 chronic kidney disease, or unspecified chronic kidney disease: Secondary | ICD-10-CM | POA: Diagnosis not present

## 2022-09-06 DIAGNOSIS — E1140 Type 2 diabetes mellitus with diabetic neuropathy, unspecified: Secondary | ICD-10-CM | POA: Insufficient documentation

## 2022-09-06 DIAGNOSIS — D500 Iron deficiency anemia secondary to blood loss (chronic): Secondary | ICD-10-CM

## 2022-09-06 DIAGNOSIS — Z79890 Hormone replacement therapy (postmenopausal): Secondary | ICD-10-CM | POA: Diagnosis not present

## 2022-09-06 DIAGNOSIS — I350 Nonrheumatic aortic (valve) stenosis: Secondary | ICD-10-CM | POA: Diagnosis not present

## 2022-09-06 DIAGNOSIS — D509 Iron deficiency anemia, unspecified: Secondary | ICD-10-CM | POA: Diagnosis not present

## 2022-09-06 DIAGNOSIS — G9341 Metabolic encephalopathy: Secondary | ICD-10-CM | POA: Diagnosis not present

## 2022-09-06 DIAGNOSIS — D631 Anemia in chronic kidney disease: Secondary | ICD-10-CM

## 2022-09-06 DIAGNOSIS — D649 Anemia, unspecified: Secondary | ICD-10-CM

## 2022-09-06 DIAGNOSIS — F32A Depression, unspecified: Secondary | ICD-10-CM | POA: Insufficient documentation

## 2022-09-06 DIAGNOSIS — J9610 Chronic respiratory failure, unspecified whether with hypoxia or hypercapnia: Secondary | ICD-10-CM | POA: Diagnosis not present

## 2022-09-06 DIAGNOSIS — Z7984 Long term (current) use of oral hypoglycemic drugs: Secondary | ICD-10-CM | POA: Insufficient documentation

## 2022-09-06 DIAGNOSIS — I480 Paroxysmal atrial fibrillation: Secondary | ICD-10-CM | POA: Insufficient documentation

## 2022-09-06 DIAGNOSIS — I5032 Chronic diastolic (congestive) heart failure: Secondary | ICD-10-CM | POA: Insufficient documentation

## 2022-09-06 DIAGNOSIS — E039 Hypothyroidism, unspecified: Secondary | ICD-10-CM | POA: Diagnosis not present

## 2022-09-06 DIAGNOSIS — E785 Hyperlipidemia, unspecified: Secondary | ICD-10-CM | POA: Diagnosis not present

## 2022-09-06 DIAGNOSIS — E611 Iron deficiency: Secondary | ICD-10-CM | POA: Insufficient documentation

## 2022-09-06 DIAGNOSIS — Z79899 Other long term (current) drug therapy: Secondary | ICD-10-CM | POA: Diagnosis not present

## 2022-09-06 DIAGNOSIS — N1832 Chronic kidney disease, stage 3b: Secondary | ICD-10-CM | POA: Insufficient documentation

## 2022-09-06 DIAGNOSIS — E538 Deficiency of other specified B group vitamins: Secondary | ICD-10-CM | POA: Diagnosis not present

## 2022-09-06 DIAGNOSIS — J449 Chronic obstructive pulmonary disease, unspecified: Secondary | ICD-10-CM | POA: Insufficient documentation

## 2022-09-06 DIAGNOSIS — Z85118 Personal history of other malignant neoplasm of bronchus and lung: Secondary | ICD-10-CM | POA: Diagnosis not present

## 2022-09-06 LAB — CBC (CANCER CENTER ONLY)
HCT: 27.2 % — ABNORMAL LOW (ref 36.0–46.0)
Hemoglobin: 8.3 g/dL — ABNORMAL LOW (ref 12.0–15.0)
MCH: 31 pg (ref 26.0–34.0)
MCHC: 30.5 g/dL (ref 30.0–36.0)
MCV: 101.5 fL — ABNORMAL HIGH (ref 80.0–100.0)
Platelet Count: 177 10*3/uL (ref 150–400)
RBC: 2.68 MIL/uL — ABNORMAL LOW (ref 3.87–5.11)
RDW: 16.8 % — ABNORMAL HIGH (ref 11.5–15.5)
WBC Count: 6 10*3/uL (ref 4.0–10.5)
nRBC: 0 % (ref 0.0–0.2)

## 2022-09-06 LAB — FERRITIN: Ferritin: 74 ng/mL (ref 11–307)

## 2022-09-06 LAB — VITAMIN B12: Vitamin B-12: 624 pg/mL (ref 180–914)

## 2022-09-06 LAB — IRON AND TIBC
Iron: 57 ug/dL (ref 28–170)
Saturation Ratios: 17 % (ref 10.4–31.8)
TIBC: 336 ug/dL (ref 250–450)
UIBC: 279 ug/dL

## 2022-09-06 MED ORDER — METHYLPREDNISOLONE SODIUM SUCC 125 MG IJ SOLR
20.0000 mg | Freq: Once | INTRAMUSCULAR | Status: AC | PRN
  Administered 2022-09-06: 20 mg via INTRAVENOUS
  Filled 2022-09-06: qty 2

## 2022-09-06 MED ORDER — SODIUM CHLORIDE 0.9 % IV SOLN
Freq: Once | INTRAVENOUS | Status: AC
  Filled 2022-09-06: qty 250

## 2022-09-06 MED ORDER — SODIUM CHLORIDE 0.9 % IV SOLN
200.0000 mg | Freq: Once | INTRAVENOUS | Status: AC
  Administered 2022-09-06: 200 mg via INTRAVENOUS
  Filled 2022-09-06: qty 200

## 2022-09-06 NOTE — Progress Notes (Unsigned)
[facility] CONSULT NOTE  Patient Care Team: Joaquim Nam, MD as PCP - General Mariah Milling, Tollie Pizza, MD as PCP - Cardiology (Cardiology) Antonieta Iba, MD as Consulting Physician (Cardiology) Oneta Rack, NP as Nurse Practitioner (Adult Health Nurse Practitioner) Earna Coder, MD as Consulting Physician (Oncology)  CHIEF COMPLAINTS/PURPOSE OF CONSULTATION: ANEMIA  HEMATOLOGY HISTORY:  # CHRONIC INTERMITTENT ANEMIA  [since 2010]- Hb SEP 2022- 8; previously on PO Iron- ; WBC/platelets- Normal; MCV- 90s. EGD- > 15 years; /Colonoscopy:4 years- [Dr.Jacob;; in [place]] capsule-? Bone marrow Biopsy-none; OCT 2022- SEVERE IRON DEFICIENCY; myeloma work-up/hemolysis work-up negative; JAN 2023-CT scan duodenal thickening-recommend GI evaluation  #   AUG 2021- RIGHT LUNG stage I non-small cell lung cancer-  s/p SBRT   [Dr.Moody; [place]]; NOV 2022- CT chest-no recurrent disease.  # COPD on 2.5 lit/day [Dr.Sood]; diastolic CHF/ OCT 2022-Low BP [needing to come off losartan- Dr.Gollan]; peripheral neuropathy.  HISTORY OF PRESENTING ILLNESS:    Alexandria [name] 87 y.o.  female with chronic respiratory failure on oxygen and severe anemia secondary to iron deficiency of unclear etiology. She was last seen by Consuello Masse NP on 08/09/2022. In the interim, she has received IV venofer 200 mg x 5 doses from 08/09/2022-08/24/2022. She is unaccompanied for this visit.   Alexandria [name] reports that her energy is unchanged after receiving IV iron infusions but adds that this past month she has been feeling better. She reports her shortness of breath is stable and continues on 2.5 L of supplemental oxygen. She reports have persistent lower extremity edema that improves with wearing compression stockings. She denies nausea, vomiting or abdominal pain. Her bowel habits are unchanged without recurrent episodes of diarrhea or constipation. She denies any overt signs of bleeding including hematochezia  or melena. She denies fevers, chills, sweats, chest pain, cough, headaches or dizziness. She has no other complaints. Rest of the ROS is below.   Review of Systems  Constitutional:  Positive for malaise/fatigue. Negative for chills, fever and weight loss.  HENT:  Negative for nosebleeds.   Respiratory:  Positive for shortness of breath. Negative for cough, hemoptysis and wheezing.   Cardiovascular:  Negative for chest pain, palpitations and leg swelling.  Gastrointestinal:  Negative for abdominal pain, blood in stool, constipation, diarrhea, melena, nausea and vomiting.  Genitourinary:  Negative for dysuria, hematuria and urgency.  Musculoskeletal:  Positive for back pain. Negative for falls and myalgias.  Skin:  Negative for itching and rash.  Neurological:  Positive for weakness. Negative for dizziness, tingling, sensory change, loss of consciousness and headaches.  Endo/Heme/Allergies:  Negative for environmental allergies. Does not bruise/bleed easily.  Psychiatric/Behavioral:  Negative for depression. The patient is not nervous/anxious and does not have insomnia.     MEDICAL HISTORY:  Past Medical History:  Diagnosis Date   Allergy, unspecified not elsewhere classified    Anxiety    Aortic stenosis, mild 03/30/2022   a. TTE 03/29/2022: EF 55-60, no RWMA, GR 1 DD, normal RVSF, mild aortic stenosis (mean 10, V-max 212 cm/s, DI 0.70)   Cataract    Chronic diastolic congestive heart failure (HCC)    a. 06/2022 Echo: EF 60-65%, no rwma, nl RV fxn, mild MR, mild AS.   COPD (chronic obstructive pulmonary disease) (HCC) 03/08/1998   PFTs 12/12/2002 FEV 1 1.42 (64%) ratio 58 with no better after B2 and DLCO75%; PFTs 11/20/09 FEV1 1.50 (73%) ratio 50 no better after B2 with DLCO 62%; Hfa 50% 11/20/2009 >75%, 01/13/10 p coaching  Coronary artery calcification seen on CT scan    a. 2012 Neg stress test.   Depression 12/07/1998   Diabetes mellitus type II 02/05/2006   Dr. Elvera Lennox with endo    Disorders of bursae and tendons in shoulder region, unspecified    Rotator cuff syndrome, right   E. coli bacteremia    Esophagitis    GERD (gastroesophageal reflux disease)    History of UTI    Hyperlipidemia 10/04/2000   Hypertension 03/08/1992   Hypothyroidism 03/08/1968   Iron deficiency anemia    Lung nodule    radiation starts 10-08-2019   Malignant neoplasm of right upper lobe of lung (HCC) 07/24/2019   Obesity    NOS   OSA (obstructive sleep apnea)    PSG 01/27/10 AHI 13, pt does not know CPAP settings   PAF (paroxysmal atrial fibrillation) (HCC)    a. 06/2022 in setting of UTI/encephalopathy-->converted on amio; b. CHA2DS2VASc = 6-->No OAC 2/2 chronic anemia/fall risk; c. 06/2022 Zio: Predominantly sinus bradycardia at 54 (3-158)'s.  1 brief run of SVT.  2.2% PVC burden.   Peripheral neuropathy    Likely due to DM per Dr. Tresa Endo hernia     SURGICAL HISTORY: Past Surgical History:  Procedure Laterality Date   ABD U/S  03/19/1999   Nml x2 foci in liver   ADENOSINE MYOVIEW  06/02/2007   Nml   CARDIOLITE PERSANTINE  08/24/2000   Nml   CAROTID U/S  08/24/2000   1-39% ICA stenosis   CAROTID U/S  06/02/2007   No apprec change    CARPAL TUNNEL RELEASE  12/1997   Right   CESAREAN SECTION     x2 [place]/ repeat   CHOLECYSTECTOMY  1997   COLONOSCOPY WITH PROPOFOL N/A 02/12/2016   Procedure: COLONOSCOPY WITH PROPOFOL;  Surgeon: Rachael Fee, MD;  Location: [facility];  Service: Endoscopy;  Laterality: N/A;   CT ABD W & PELVIS WO/W CM     Abd hemangiomas of liver, 1 cm R renal cyst   DENTAL SURGERY  2016   Implants   DEXA  07/03/2003   Nml   ESOPHAGOGASTRODUODENOSCOPY  12/05/1997   Nml (due to hoarseness)   ESOPHAGOGASTRODUODENOSCOPY (EGD) WITH PROPOFOL N/A 02/12/2016   Procedure: ESOPHAGOGASTRODUODENOSCOPY (EGD) WITH PROPOFOL;  Surgeon: Rachael Fee, MD;  Location: [facility];  Service: Endoscopy;  Laterality: N/A;   GALLBLADDER SURGERY     HERNIA REPAIR   01/24/2009   Lap Ventr w/ Lysis of adhesions (Dr. Dwain Sarna)   knee arthroscopic surgery  years ago   right   ROTATOR CUFF REPAIR  1984   Right, [place]   SHOULDER OPEN ROTATOR CUFF REPAIR  02/08/2012   Procedure: ROTATOR CUFF REPAIR SHOULDER OPEN;  Surgeon: Drucilla Schmidt, MD;  Location: [facility];  Service: Orthopedics;  Laterality: Left;  Left Shoulder Open Anterior Acrominectomy Rotator Cuff Repair Open Distal Clavicle Resection ,tissue mend graft, and repair of biceps tendon   THUMB RELEASE  12/1997   Right   TONSILLECTOMY     TOTAL ABDOMINAL HYSTERECTOMY  1985   Due to dysmennorhea   US ECHOCARDIOGRAPHY  06/02/2007    SOCIAL HISTORY: Social History   Socioeconomic History   Marital status: Widowed    Spouse name: Not on file   Number of children: 2   Years of education: Not on file   Highest education level: Not on file  Occupational History   Occupation: Retired    Associate Professor: RETIRED  Tobacco Use  Smoking status: Former    Packs/day: 2.00    Years: 50.00    Additional pack years: 0.00    Total pack years: 100.00    Types: Cigarettes    Quit date: 02/05/2005    Years since quitting: 17.5   Smokeless tobacco: Never  Vaping Use   Vaping Use: Never used  Substance and Sexual Activity   Alcohol use: Yes    Comment: occasionally   Drug use: Never   Sexual activity: Not Currently  Other Topics Concern   Not on file  Social History Narrative   Widowed 2023, 2 children; Enjoys painting      Quit smoking in 2007; no alcohol; lives in [place]; [name]/d lives in [place]. Had own business. Dont drive- sec to neuropathy.    Social Determinants of Health   Financial Resource Strain: Not on file  Food Insecurity: No Food Insecurity (06/30/2022)   Hunger Vital Sign    Worried About Running Out of Food in the Last Year: Never true    Ran Out of Food in the Last Year: Never true  Transportation Needs: No Transportation Needs (06/30/2022)   PRAPARE - Therapist, art (Medical): No    Lack of Transportation (Non-Medical): No  Physical Activity: Not on file  Stress: Not on file  Social Connections: Not on file  Intimate Partner Violence: Not At Risk (06/30/2022)   Humiliation, Afraid, Rape, and Kick questionnaire    Fear of Current or Ex-Partner: No    Emotionally Abused: No    Physically Abused: No    Sexually Abused: No    FAMILY HISTORY: Family History  Problem Relation Age of Onset   Heart disease Mother    Thyroid disease Mother    Emphysema Father        One lung   Cystic fibrosis Sister    Hyperthyroidism Sister    Osteoarthritis Brother    Hyperthyroidism Brother    Esophageal cancer Neg Hx    Cancer Neg Hx        Head or neck   Colon cancer Neg Hx    Stomach cancer Neg Hx    Breast cancer Neg Hx     ALLERGIES:  is allergic to antihistamines, diphenhydramine-type; incruse ellipta [umeclidinium bromide]; clarithromycin; codeine; diphenhydramine; pregabalin; sulfonamide derivatives; tiotropium bromide monohydrate; umeclidinium; and vraylar [cariprazine].  MEDICATIONS:  Current Outpatient Medications  Medication Sig Dispense Refill   acetaminophen (TYLENOL) 325 MG tablet Take 2 tablets (650 mg total) by mouth every 6 (six) hours as needed for mild pain, headache, fever or moderate pain.     albuterol (PROAIR HFA) 108 (90 Base) MCG/ACT inhaler Inhale 2 puffs into the lungs every 6 (six) hours as needed for wheezing or shortness of breath. INHALE 2 PUFFS INTO THE LUNGS EVERY 6 HOURS AS NEEDED FOR WHEEZING OR SHORTNESS OF BREATH 3 Inhaler 3   amiodarone (PACERONE) 200 MG tablet Take 1 tablet (200 mg total) by mouth daily. 90 tablet 1   arformoterol (BROVANA) 15 MCG/2ML NEBU Take 2 mLs (15 mcg total) by nebulization at bedtime. 120 mL 6   bisoprolol (ZEBETA) 5 MG tablet Take 0.5 tablets (2.5 mg total) by mouth daily. 45 tablet 1   budesonide (PULMICORT) 0.5 MG/2ML nebulizer solution Take 2 mLs (0.5 mg total) by  nebulization in the morning and at bedtime. 360 mL 3   busPIRone (BUSPAR) 15 MG tablet Take 15 mg by mouth 3 (three) times daily.     Cholecalciferol (  VITAMIN D) 50 MCG (2000 UT) tablet Take 1 tablet (2,000 Units total) by mouth daily.     Coenzyme Q-10 200 MG CAPS Take 200 mg by mouth daily.     Cyanocobalamin (B-12) 5000 MCG CAPS Take 5,000 mcg by mouth daily.     doxycycline (VIBRAMYCIN) 100 MG capsule Take 1 capsule (100 mg total) by mouth 2 (two) times daily. 14 capsule 0   esomeprazole (NEXIUM) 40 MG capsule Take 1 capsule (40 mg total) by mouth daily at 12 noon. 30 capsule 11   ezetimibe (ZETIA) 10 MG tablet TAKE 1 TABLET BY MOUTH EVERYDAY AT BEDTIME 90 tablet 2   ferrous sulfate 325 (65 FE) MG tablet TAKE 1 TABLET BY MOUTH EVERY DAY 90 tablet 3   FLUoxetine (PROZAC) 20 MG tablet Take 20 mg by mouth 2 (two) times daily.     glucose blood (ONETOUCH VERIO) test strip USE AS INSTRUCTED TO CHECK SUGAR 1 TIME DAILY 100 strip 3   guaiFENesin (MUCINEX) 600 MG 12 hr tablet Take 2 tablets (1,200 mg total) by mouth 2 (two) times daily as needed for cough or to loosen phlegm.     isosorbide mononitrate (IMDUR) 30 MG 24 hr tablet Take 30 mg by mouth daily.     levothyroxine (SYNTHROID) 125 MCG tablet Take 1 tablet (125 mcg total) by mouth daily. 90 tablet 3   magnesium 30 MG tablet Take 1 tablet (30 mg total) by mouth 2 (two) times daily. 180 tablet 3   melatonin 5 MG TABS Take 1 tablet (5 mg total) by mouth at bedtime.  0   metFORMIN (GLUCOPHAGE) 500 MG tablet Take 1 tablet (500 mg total) by mouth every evening. 90 tablet 3   nitroGLYCERIN (NITROSTAT) 0.4 MG SL tablet Place 1 tablet (0.4 mg total) under the tongue every 5 (five) minutes as needed for chest pain. 25 tablet 3   nystatin (MYCOSTATIN) 100000 UNIT/ML suspension Take 5 mLs (500,000 Units total) by mouth 4 (four) times daily. 120 mL 0   OneTouch Delica Lancets 33G MISC Use 1x a day 100 each 3   OXYGEN Inhale into the lungs. 2lpm      potassium chloride (KLOR-CON) 10 MEQ tablet Take 1 tablet (10 mEq total) by mouth daily. 90 tablet 3   rosuvastatin (CRESTOR) 10 MG tablet TAKE 1 TABLET BY MOUTH EVERY OTHER DAY 45 tablet 2   Spacer/Aero Chamber Mouthpiece MISC Use with  inhaler as needed.  J44.9 1 each 0   torsemide (DEMADEX) 20 MG tablet Take 2 tablets (40 mg total) by mouth daily. Take an additional 20 mg daily as needed for weight greater then 230 pounds 270 tablet 1   Zinc Oxide (TRIPLE PASTE) 12.8 % ointment Apply 1 Application topically as needed for irritation. Every shift.     No current facility-administered medications for this visit.    PHYSICAL EXAMINATION: Vitals:   09/06/22 1317  BP: (!) 126/47  Pulse: 61  Temp: (!) 97.2 F (36.2 C)  SpO2: 95%   Filed Weights   09/06/22 1317  Weight: 228 lb (103.4 kg)   Physical Exam Vitals reviewed.  Constitutional:      Appearance: She is morbidly obese. She is not ill-appearing.     Interventions: Nasal cannula in place.     Comments: Unaccompanied.  Cardiovascular:     Rate and Rhythm: Normal rate.  Pulmonary:     Effort: No respiratory distress.     Comments: Decreased breath sounds bilaterally.  Scattered wheezing. On  oxygen via [place].  Skin:    Coloration: Skin is pale.  Neurological:     Mental Status: She is alert and oriented to person, place, and time.     Motor: No weakness.  Psychiatric:        Mood and Affect: Mood normal.        Behavior: Behavior normal.     LABORATORY DATA:  I have reviewed the data as listed Lab Results  Component Value Date   WBC 6.0 09/06/2022   HGB 8.3 (L) 09/06/2022   HCT 27.2 (L) 09/06/2022   MCV 101.5 (H) 09/06/2022   PLT 177 09/06/2022   Recent Labs    06/29/22 1302 06/29/22 1437 06/30/22 0631 07/07/22 0718 07/08/22 0000 08/03/22 1234 08/09/22 1305 08/19/22 1300  NA 134* 136   < > 144 143 142 141 143  K 4.6 4.6   < > 3.9 3.9 3.8 4.6 4.5  CL 94* 95*   < > 100 101 94* 102 99  CO2 33* 38*   < > 36*  39* 40* 31 36*  GLUCOSE 174* 130*   < > 104*  --  102* 108* 118*  BUN 33* 33*   < > 46* 40* 56* 26* 33*  CREATININE 1.38* 1.36*   < > 1.25* 1.2* 1.62* 1.22* 1.33*  CALCIUM 7.8* 7.8*   < > 8.6* 8.7 8.8 8.2* 8.7*  GFRNONAA 38* 38*   < > 43*  --   --  44* 39*  PROT 6.5 6.3*  --   --   --   --  6.4*  --   ALBUMIN 2.9* 2.9*  --   --  3.2*  --  3.2*  --   AST 16 16  --   --  14  --  15  --   ALT 15 16  --   --  22  --  15  --   ALKPHOS 104 100  --   --  78  --  73  --   BILITOT 0.5 0.8  --   --   --   --  0.3  --    < > = values in this interval not displayed.    Iron/TIBC/Ferritin/ %Sat    Component Value Date/Time   IRON 37 08/09/2022 1305   IRON 52 07/22/2022 0000   TIBC 372 08/09/2022 1305   FERRITIN 17 08/09/2022 1305   IRONPCTSAT 10 (L) 08/09/2022 1305     No results found.  ASSESSMENT AND PLAN: Alexandria [name] is a 87 y.o. female who presents for a follow up for chronic anemia.   #Iron Deficiency Anemia-  --Chronic in nature over the last 10+ years. --Underwent EGD/colonoscopy in 2017 without any etiology found.  --Under the care of GI, last saw Dr. Tomasa Rand on 09/03/2022. Discussed role of capsular endoscopy for further evaluation, declined for now.  --Last received IV venofer 200 mg x 5 doses from 08/09/2022-08/24/2022.She has tolerated venofer well but previously experienced possible allergic reaction and has been premedicated with solumedrol 40 mg since.  PLAN: --Labs today overall stable Hgb over the last two weeks with Hgb 8.3, MCV 101.5. Iron panel shows ferritin 74, iron 57, TIBC 336, saturation 17%.  --Proceed with planned IV venofer 200 mg infusion today  #CKD- stage III.  --Likely contributing to her chronic anemia. --Has not yet started EPO injections. Since we have optimized iron stores and hemoglobin is persistently < 10, recommend to start EPO injections.   #B12  deficiency: --Last received Vitamin B12 injection 1000 mcg on 07/04/2022. --Vitamin B12 level is  624 today, no need for additional B12 supplementation at this time.   #Stage I Lung Cancer- Aug 2021 right lung, NSCLC.  --S/p SBRT with Dr. Mitzi Hansen in [place]. 03/2022 CT negative for recurrent disease.   #COPD & Chronic respiratory failure & Diastolic CHF: --Overall stable, on 2.5 L of supplemental oxygen. Marland Kitchen   #Metabolic encephalopathy-  --Obtunded in clinic obn 06/29/2022.Transferred to ER and found to have leukocytosis and hypoxia. Secondary to UTI. Discharged to rehab.  --Now lives at home. Lives alone but increased care needs.    #Toenail avulsion & diabetic toenails-  --Evaluated by podiatry on 08/13/2022. Underwent ingrown nail excision.   DISPOSITION: 2 weeks- lab (H&H), retacrit injection 4 weeks- lab (cbc, iron and TIBC, ferritin), see Dr Donneta Romberg, +/- venofer or retacrit  No problem-specific Assessment & Plan notes found for this encounter.  All questions were answered. The patient knows to call the clinic with any problems, questions or concerns.  I have spent a total of 30 minutes minutes of face-to-face and non-face-to-face time, preparing to see the patient, performing a medically appropriate examination, counseling and educating the patient, ordering medications/tests/procedures, documenting clinical information in the electronic health record, and care coordination.   Georga Kaufmann PA-C Dept of Hematology and Oncology [facility]

## 2022-09-07 ENCOUNTER — Other Ambulatory Visit: Payer: Self-pay | Admitting: *Deleted

## 2022-09-07 ENCOUNTER — Encounter: Payer: Self-pay | Admitting: Internal Medicine

## 2022-09-07 DIAGNOSIS — J9621 Acute and chronic respiratory failure with hypoxia: Secondary | ICD-10-CM | POA: Diagnosis not present

## 2022-09-07 DIAGNOSIS — E1142 Type 2 diabetes mellitus with diabetic polyneuropathy: Secondary | ICD-10-CM | POA: Diagnosis not present

## 2022-09-07 DIAGNOSIS — I480 Paroxysmal atrial fibrillation: Secondary | ICD-10-CM | POA: Diagnosis not present

## 2022-09-07 DIAGNOSIS — D649 Anemia, unspecified: Secondary | ICD-10-CM

## 2022-09-07 DIAGNOSIS — I110 Hypertensive heart disease with heart failure: Secondary | ICD-10-CM | POA: Diagnosis not present

## 2022-09-07 DIAGNOSIS — G903 Multi-system degeneration of the autonomic nervous system: Secondary | ICD-10-CM | POA: Diagnosis not present

## 2022-09-07 DIAGNOSIS — J9622 Acute and chronic respiratory failure with hypercapnia: Secondary | ICD-10-CM | POA: Diagnosis not present

## 2022-09-08 ENCOUNTER — Telehealth: Payer: Self-pay | Admitting: *Deleted

## 2022-09-08 NOTE — Telephone Encounter (Signed)
Patient hgb is 8.3- per Ashley, [place] likely from pt's h/o of CKD. Karena Addison recommends proceed with retacrit injection. Per Karena Addison, pt has had this discussion with Leotis Shames, NP in the past. PA recommends proceeding with retacrit when pt returns for labs on 7/15. Amy cma reached out to patient via mychart. Pt would like nursing team to call her to discuss this option.  I personally called daughter and spoke to her about the lab results and recommended plan. Daughter is hesitant to make this decision on adding Retacrit injections. She stated that this was the first time she was hearing anything about this medication. Daughter wanted provider to go over the risk factors for the medication and she wanted to know some of the basic side effects of the medication and purpose of the drug. I explained to her that the medication is commonly given when the anemia is related to chronic renal disease. Daughter stated that this is the first she is hearing the her mom has renal disease and wanted to know when she was dx with renal disease. I explained to her that the CKD was documented in her chart since at least December. Per Dr. Senaida Lange note on 2/26- the CKD was most likely r/t Torsemide use. I explained to her that chronic kidney disease means that the kidneys are not filtering the blood the way it normally should. When patients develop high blood pressure, heart disease or even diabetes, the kidneys can get damage. I explained to her that when kidneys get damaged, the EPO hormone does not always produce enough hormone to signal her bone marrow to produce red cells- thus causing the anemia. I also discussed that Epo/Retacrit shots we recommend is the treatment for this. She asked about side effects and if the shot could increase pt's blood pressure. I told her that the injection could cause hypertension, but we would also check the blood pressure readings prior to give her mother the injection. If the blood pressure is very high, we have  to sometimes hold the injection. She stressed that she would not want her mother to have an injection if it could potentially cause high blood pressure. She would like to discuss this concern with Dr. B before starting any new medication. I told the daughter that she nor the patient need to make a decision about adding the Retacrit injection. Patient has an apt at the end of July. She can discuss these concerns with Dr. B at that time. Daughter thanked me for the information. She will discuss all the above with her mom and let us know if she wants to proceed with the injection sooner than end of July.

## 2022-09-08 NOTE — Telephone Encounter (Signed)
Rn called patient's daughter- see phone note. Daughter would like to take time to think about this option and will get back with the provider on the decision.- see phone note

## 2022-09-10 ENCOUNTER — Ambulatory Visit: Payer: PRIVATE HEALTH INSURANCE | Admitting: Nurse Practitioner

## 2022-09-10 ENCOUNTER — Ambulatory Visit: Payer: Medicare Other

## 2022-09-10 ENCOUNTER — Ambulatory Visit: Payer: Medicare Other | Admitting: Nurse Practitioner

## 2022-09-10 ENCOUNTER — Other Ambulatory Visit: Payer: Medicare Other

## 2022-09-11 DIAGNOSIS — I350 Nonrheumatic aortic (valve) stenosis: Secondary | ICD-10-CM | POA: Diagnosis not present

## 2022-09-11 DIAGNOSIS — J441 Chronic obstructive pulmonary disease with (acute) exacerbation: Secondary | ICD-10-CM | POA: Diagnosis not present

## 2022-09-11 DIAGNOSIS — I110 Hypertensive heart disease with heart failure: Secondary | ICD-10-CM | POA: Diagnosis not present

## 2022-09-11 DIAGNOSIS — J9621 Acute and chronic respiratory failure with hypoxia: Secondary | ICD-10-CM | POA: Diagnosis not present

## 2022-09-11 DIAGNOSIS — J439 Emphysema, unspecified: Secondary | ICD-10-CM | POA: Diagnosis not present

## 2022-09-11 DIAGNOSIS — Z6841 Body Mass Index (BMI) 40.0 and over, adult: Secondary | ICD-10-CM | POA: Diagnosis not present

## 2022-09-11 DIAGNOSIS — N390 Urinary tract infection, site not specified: Secondary | ICD-10-CM | POA: Diagnosis not present

## 2022-09-11 DIAGNOSIS — E039 Hypothyroidism, unspecified: Secondary | ICD-10-CM | POA: Diagnosis not present

## 2022-09-11 DIAGNOSIS — E1142 Type 2 diabetes mellitus with diabetic polyneuropathy: Secondary | ICD-10-CM | POA: Diagnosis not present

## 2022-09-11 DIAGNOSIS — E662 Morbid (severe) obesity with alveolar hypoventilation: Secondary | ICD-10-CM | POA: Diagnosis not present

## 2022-09-11 DIAGNOSIS — B9620 Unspecified Escherichia coli [E. coli] as the cause of diseases classified elsewhere: Secondary | ICD-10-CM | POA: Diagnosis not present

## 2022-09-11 DIAGNOSIS — J4489 Other specified chronic obstructive pulmonary disease: Secondary | ICD-10-CM | POA: Diagnosis not present

## 2022-09-11 DIAGNOSIS — K219 Gastro-esophageal reflux disease without esophagitis: Secondary | ICD-10-CM | POA: Diagnosis not present

## 2022-09-11 DIAGNOSIS — G903 Multi-system degeneration of the autonomic nervous system: Secondary | ICD-10-CM | POA: Diagnosis not present

## 2022-09-11 DIAGNOSIS — J9622 Acute and chronic respiratory failure with hypercapnia: Secondary | ICD-10-CM | POA: Diagnosis not present

## 2022-09-11 DIAGNOSIS — E785 Hyperlipidemia, unspecified: Secondary | ICD-10-CM | POA: Diagnosis not present

## 2022-09-11 DIAGNOSIS — Z792 Long term (current) use of antibiotics: Secondary | ICD-10-CM | POA: Diagnosis not present

## 2022-09-11 DIAGNOSIS — I2510 Atherosclerotic heart disease of native coronary artery without angina pectoris: Secondary | ICD-10-CM | POA: Diagnosis not present

## 2022-09-11 DIAGNOSIS — F419 Anxiety disorder, unspecified: Secondary | ICD-10-CM | POA: Diagnosis not present

## 2022-09-11 DIAGNOSIS — Z7984 Long term (current) use of oral hypoglycemic drugs: Secondary | ICD-10-CM | POA: Diagnosis not present

## 2022-09-11 DIAGNOSIS — I480 Paroxysmal atrial fibrillation: Secondary | ICD-10-CM | POA: Diagnosis not present

## 2022-09-11 DIAGNOSIS — G4733 Obstructive sleep apnea (adult) (pediatric): Secondary | ICD-10-CM | POA: Diagnosis not present

## 2022-09-11 DIAGNOSIS — Z85118 Personal history of other malignant neoplasm of bronchus and lung: Secondary | ICD-10-CM | POA: Diagnosis not present

## 2022-09-11 DIAGNOSIS — D509 Iron deficiency anemia, unspecified: Secondary | ICD-10-CM | POA: Diagnosis not present

## 2022-09-11 DIAGNOSIS — F32A Depression, unspecified: Secondary | ICD-10-CM | POA: Diagnosis not present

## 2022-09-14 ENCOUNTER — Encounter: Payer: Self-pay | Admitting: Internal Medicine

## 2022-09-14 ENCOUNTER — Ambulatory Visit (INDEPENDENT_AMBULATORY_CARE_PROVIDER_SITE_OTHER): Payer: Medicare Other | Admitting: Internal Medicine

## 2022-09-14 VITALS — BP 120/78 | HR 60

## 2022-09-14 DIAGNOSIS — E039 Hypothyroidism, unspecified: Secondary | ICD-10-CM | POA: Diagnosis not present

## 2022-09-14 DIAGNOSIS — E1140 Type 2 diabetes mellitus with diabetic neuropathy, unspecified: Secondary | ICD-10-CM

## 2022-09-14 DIAGNOSIS — Z7984 Long term (current) use of oral hypoglycemic drugs: Secondary | ICD-10-CM

## 2022-09-14 LAB — CBC WITH DIFFERENTIAL/PLATELET
Basophils Absolute: 0.1 10*3/uL (ref 0.0–0.1)
Basophils Relative: 0.7 % (ref 0.0–3.0)
Eosinophils Absolute: 0.1 10*3/uL (ref 0.0–0.7)
Eosinophils Relative: 1.2 % (ref 0.0–5.0)
HCT: 26.8 % — ABNORMAL LOW (ref 36.0–46.0)
Hemoglobin: 8.7 g/dL — ABNORMAL LOW (ref 12.0–15.0)
Lymphocytes Relative: 18.2 % (ref 12.0–46.0)
Lymphs Abs: 1.3 10*3/uL (ref 0.7–4.0)
MCHC: 32.5 g/dL (ref 30.0–36.0)
MCV: 95.7 fl (ref 78.0–100.0)
Monocytes Absolute: 0.6 10*3/uL (ref 0.1–1.0)
Monocytes Relative: 8.4 % (ref 3.0–12.0)
Neutro Abs: 5.2 10*3/uL (ref 1.4–7.7)
Neutrophils Relative %: 71.5 % (ref 43.0–77.0)
Platelets: 181 10*3/uL (ref 150.0–400.0)
RBC: 2.8 Mil/uL — ABNORMAL LOW (ref 3.87–5.11)
RDW: 17.2 % — ABNORMAL HIGH (ref 11.5–15.5)
WBC: 7.2 10*3/uL (ref 4.0–10.5)

## 2022-09-14 LAB — LIPID PANEL
Cholesterol: 170 mg/dL (ref 0–200)
HDL: 66.9 mg/dL (ref >39.00)
LDL Cholesterol: 72 mg/dL (ref 0–99)
NonHDL: 103.07
Total CHOL/HDL Ratio: 3
Triglycerides: 154 mg/dL — ABNORMAL HIGH (ref 0.0–149.0)
VLDL: 30.8 mg/dL (ref 0.0–40.0)

## 2022-09-14 LAB — VITAMIN B12: Vitamin B-12: 639 pg/mL (ref 211–911)

## 2022-09-14 MED ORDER — METFORMIN HCL 500 MG PO TABS: 500.0000 mg | ORAL_TABLET | Freq: Every evening | ORAL | 3 refills | Status: DC

## 2022-09-14 MED ORDER — LEVOTHYROXINE SODIUM 125 MCG PO TABS: 125.0000 ug | ORAL_TABLET | Freq: Every day | ORAL | 3 refills | Status: DC

## 2022-09-14 NOTE — Patient Instructions (Addendum)
Please continue Synthroid 125 mcg daily.  Take the thyroid hormone every day, with water, at least 30 minutes before breakfast, separated by at least 4 hours from: - acid reflux medications - calcium - iron - multivitamins  Please continue: - Metformin 500 mg with supper  Please stop at the lab.  Please return in 6 months with your sugar log. However, can follow up with Dr. Para March.

## 2022-09-14 NOTE — Progress Notes (Signed)
Patient ID: Alexandria [name], female   DOB: Sep 04, 1935, 87 y.o.   MRN: [id number]  HPI: Alexandria [name] is a 87 y.o.-year-old female, returning for follow-up for DM2, dx in 2004-09-27, non-insulin-dependent, controlled, with complications (mild CKD) and uncontrolled hypothyroidism. Last visit 6 months ago. At last visit, the conversation was also carried out with her daughter, on the phone.  Interim history: She continues to have fatigue, chest pain, shortness of breath, back pain. She continues to have significant fluid retention. She is also on Torsemide. She is still grieving for her husband who died in 09-27-20. They were married for 58 years.  DM2: Reviewed HbA1c levels: Lab Results  Component Value Date   HGBA1C 5.3 03/16/2022   HGBA1C 5.0 09/11/2021   HGBA1C 5.0 10/28/2020   Pt is on: - Metformin 500 mg with dinner She was on Januvia >> stopped in summer 2016, when she was admitted for sepsis. We did not restart afterwards as her sugars remained controlled.  Pt is checking sugars 0-1a day: - am: 97-107 >> 130-140 >> up to 140 >> 126, 127 >> 89-125, 157 - 2h after brunch:  124-195 >> n/c >> 175 >> n/c - before lunch: 103, 109 >> n/c >> cannot remember - before dinner: 114-152 >> n/c >> 125 >> 105 >> n/c - 2h after dinner: up to 180 >> 90-120 >> cannot remember >> 167 >> n/c - bedtime: 124, 147 >> n/c >> 133 >> n/c >> 179, 188 >> n/c - nighttime: n/c >> 169, 175 >> n/c Lowest sugar was 97 >> 90 >> 90 >> 116 >> 89; she has hypoglycemia awareness in the 60s Highest sugar was 150 >> 250 x1 (banana or cake) >> 258 (chocolate) >> 153  Glucometer:One Touch Ultra mini >> One Touch Verio  Pt's meals are:  - Breakfast: protein drink, egg, cereals >> cottage cheese, pineapple, deviled egg + tomato or fruit - Lunch: sandwich - Dinner: meat + 2 veggies - Snacks: 1-3 peanut butter, milk, crackers  -+ Mild CKD, last BUN/creatinine:  Lab Results  Component Value Date   BUN 33 (H) 08/19/2022    CREATININE 1.33 (H) 08/19/2022  Off valsartan.  -+ HL; last set of lipids: Lab Results  Component Value Date   CHOL 140 08/06/2021   HDL 57.80 08/06/2021   LDLCALC 62 08/06/2021   LDLDIRECT 63.0 07/29/2015   TRIG 99.0 08/06/2021   CHOLHDL 2 08/06/2021  On Crestor 10 every other day, Zetia 10 daily.  - last eye exam was: 06/2021: No DR, + cataract.   - No numbness and tingling in her feet.  Last foot exam by Dr. Annamary Rummage 08/26/2022.  Hypothyroidism. -Uncontrolled -on Synthroid DAW due to fluctuating TFTs.  Pt is on Synthroid 125 mcg daily, taken: - in am - fasting - at least 30 min from b'fast - + calcium, iron, multivitamins, acid reflux medicines more than 4 hours after levothyroxine - not on Biotin  Reviewed her TFTs: Lab Results  Component Value Date   TSH 0.899 06/30/2022   TSH 1.40 09/11/2021   TSH 1.24 08/06/2021   TSH 0.17 (L) 06/08/2021   TSH 0.73 11/21/2020   TSH 3.60 09/12/2020   TSH 0.80 04/29/2020   TSH 0.29 (L) 01/25/2020   TSH 9.48 (H) 10/31/2019   TSH 6.56 (H) 08/30/2019   TSH 18.54 (H) 05/02/2019   TSH 41.98 (H) 11/01/2018   TSH 3.83 12/22/2017   TSH 0.80 02/09/2017   TSH 0.93 01/01/2016   TSH 1.75 03/14/2015  TSH 2.83 01/24/2015   TSH 0.18 (L) 12/04/2014   TSH 0.55 10/18/2014   TSH 0.341 (L) 09/29/2014   Pt denies: - feeling nodules in neck - hoarseness  She has:  - dysphagia - upper chest - choking  She has a h/o COPD- Dr. Craige Cotta, HTN, anemia, GERD. She fell in 09/2015 >> hurt R leg (had many stitches) and R orbit. She was in rehab afterwards. She was diagnosed with lung cancer in 2021.  She had radiation therapy.  Continues to be short of breath and wears oxygen.  She has anxiety. She had a rib fracture in 06/2021. She has iron deficiency anemia.  ROS: + See HPI  I reviewed pt's medications, allergies, PMH, social hx, family hx, and changes were documented in the history of present illness. Otherwise, unchanged from my initial  visit note.  Past Medical History:  Diagnosis Date   Allergy, unspecified not elsewhere classified    Anxiety    Aortic stenosis, mild 03/30/2022   a. TTE 03/29/2022: EF 55-60, no RWMA, GR 1 DD, normal RVSF, mild aortic stenosis (mean 10, V-max 212 cm/s, DI 0.70)   Cataract    Chronic diastolic congestive heart failure (HCC)    a. 06/2022 Echo: EF 60-65%, no rwma, nl RV fxn, mild MR, mild AS.   COPD (chronic obstructive pulmonary disease) (HCC) 03/08/1998   PFTs 12/12/2002 FEV 1 1.42 (64%) ratio 58 with no better after B2 and DLCO75%; PFTs 11/20/09 FEV1 1.50 (73%) ratio 50 no better after B2 with DLCO 62%; Hfa 50% 11/20/2009 >75%, 01/13/10 p coaching   Coronary artery calcification seen on CT scan    a. 2012 Neg stress test.   Depression 12/07/1998   Diabetes mellitus type II 02/05/2006   Dr. Elvera Lennox with endo   Disorders of bursae and tendons in shoulder region, unspecified    Rotator cuff syndrome, right   E. coli bacteremia    Esophagitis    GERD (gastroesophageal reflux disease)    History of UTI    Hyperlipidemia 10/04/2000   Hypertension 03/08/1992   Hypothyroidism 03/08/1968   Iron deficiency anemia    Lung nodule    radiation starts 10-08-2019   Malignant neoplasm of right upper lobe of lung (HCC) 07/24/2019   Obesity    NOS   OSA (obstructive sleep apnea)    PSG 01/27/10 AHI 13, pt does not know CPAP settings   PAF (paroxysmal atrial fibrillation) (HCC)    a. 06/2022 in setting of UTI/encephalopathy-->converted on amio; b. CHA2DS2VASc = 6-->No OAC 2/2 chronic anemia/fall risk; c. 06/2022 Zio: Predominantly sinus bradycardia at 54 (3-158)'s.  1 brief run of SVT.  2.2% PVC burden.   Peripheral neuropathy    Likely due to DM per Dr. Tresa Endo hernia    Past Surgical History:  Procedure Laterality Date   ABD U/S  03/19/1999   Nml x2 foci in liver   ADENOSINE MYOVIEW  06/02/2007   Nml   CARDIOLITE PERSANTINE  08/24/2000   Nml   CAROTID U/S  08/24/2000   1-39% ICA  stenosis   CAROTID U/S  06/02/2007   No apprec change    CARPAL TUNNEL RELEASE  12/1997   Right   CESAREAN SECTION     x2 Breech/ repeat   CHOLECYSTECTOMY  1997   COLONOSCOPY WITH PROPOFOL N/A 02/12/2016   Procedure: COLONOSCOPY WITH PROPOFOL;  Surgeon: Rachael Fee, MD;  Location: [facility];  Service: Endoscopy;  Laterality: N/A;   CT ABD W &  PELVIS WO/W CM     Abd hemangiomas of liver, 1 cm R renal cyst   DENTAL SURGERY  2016   Implants   DEXA  07/03/2003   Nml   ESOPHAGOGASTRODUODENOSCOPY  12/05/1997   Nml (due to hoarseness)   ESOPHAGOGASTRODUODENOSCOPY (EGD) WITH PROPOFOL N/A 02/12/2016   Procedure: ESOPHAGOGASTRODUODENOSCOPY (EGD) WITH PROPOFOL;  Surgeon: Rachael Fee, MD;  Location: [facility];  Service: Endoscopy;  Laterality: N/A;   GALLBLADDER SURGERY     HERNIA REPAIR  01/24/2009   Lap Ventr w/ Lysis of adhesions (Dr. Dwain Sarna)   knee arthroscopic surgery  years ago   right   ROTATOR CUFF REPAIR  1984   Right, [place]   SHOULDER OPEN ROTATOR CUFF REPAIR  02/08/2012   Procedure: ROTATOR CUFF REPAIR SHOULDER OPEN;  Surgeon: Drucilla Schmidt, MD;  Location: [facility];  Service: Orthopedics;  Laterality: Left;  Left Shoulder Open Anterior Acrominectomy Rotator Cuff Repair Open Distal Clavicle Resection ,tissue mend graft, and repair of biceps tendon   THUMB RELEASE  12/1997   Right   TONSILLECTOMY     TOTAL ABDOMINAL HYSTERECTOMY  1985   Due to dysmennorhea   US ECHOCARDIOGRAPHY  06/02/2007   Social History   Occupational History   Retired - self employed- Engineer, civil (consulting)    Social History Main Topics   Smoking status: Former Smoker -- 2.00 packs/day for 50 years    Types: Cigarettes    Quit date: 2007   Smokeless tobacco: Never Used   Alcohol Use: 1.2 oz/week    2 Standard drinks or equivalent per week     Comment: occasional   Drug Use: No   Sexual Activity: Not on file   Social History Narrative   Married with 2 children   Enjoys  painting   Current Outpatient Medications on File Prior to Visit  Medication Sig Dispense Refill   acetaminophen (TYLENOL) 325 MG tablet Take 2 tablets (650 mg total) by mouth every 6 (six) hours as needed for mild pain, headache, fever or moderate pain.     albuterol (PROAIR HFA) 108 (90 Base) MCG/ACT inhaler Inhale 2 puffs into the lungs every 6 (six) hours as needed for wheezing or shortness of breath. INHALE 2 PUFFS INTO THE LUNGS EVERY 6 HOURS AS NEEDED FOR WHEEZING OR SHORTNESS OF BREATH 3 Inhaler 3   amiodarone (PACERONE) 200 MG tablet Take 1 tablet (200 mg total) by mouth daily. 90 tablet 1   arformoterol (BROVANA) 15 MCG/2ML NEBU Take 2 mLs (15 mcg total) by nebulization at bedtime. 120 mL 6   bisoprolol (ZEBETA) 5 MG tablet Take 0.5 tablets (2.5 mg total) by mouth daily. 45 tablet 1   budesonide (PULMICORT) 0.5 MG/2ML nebulizer solution Take 2 mLs (0.5 mg total) by nebulization in the morning and at bedtime. 360 mL 3   busPIRone (BUSPAR) 15 MG tablet Take 15 mg by mouth 3 (three) times daily.     Cholecalciferol (VITAMIN D) 50 MCG (2000 UT) tablet Take 1 tablet (2,000 Units total) by mouth daily.     Coenzyme Q-10 200 MG CAPS Take 200 mg by mouth daily.     Cyanocobalamin (B-12) 5000 MCG CAPS Take 5,000 mcg by mouth daily.     doxycycline (VIBRAMYCIN) 100 MG capsule Take 1 capsule (100 mg total) by mouth 2 (two) times daily. 14 capsule 0   esomeprazole (NEXIUM) 40 MG capsule Take 1 capsule (40 mg total) by mouth daily at 12 noon. 30 capsule 11  ezetimibe (ZETIA) 10 MG tablet TAKE 1 TABLET BY MOUTH EVERYDAY AT BEDTIME 90 tablet 2   ferrous sulfate 325 (65 FE) MG tablet TAKE 1 TABLET BY MOUTH EVERY DAY 90 tablet 3   FLUoxetine (PROZAC) 20 MG tablet Take 20 mg by mouth 2 (two) times daily.     glucose blood (ONETOUCH VERIO) test strip USE AS INSTRUCTED TO CHECK SUGAR 1 TIME DAILY 100 strip 3   guaiFENesin (MUCINEX) 600 MG 12 hr tablet Take 2 tablets (1,200 mg total) by mouth 2 (two) times  daily as needed for cough or to loosen phlegm.     isosorbide mononitrate (IMDUR) 30 MG 24 hr tablet Take 30 mg by mouth daily.     levothyroxine (SYNTHROID) 125 MCG tablet Take 1 tablet (125 mcg total) by mouth daily. 90 tablet 3   magnesium 30 MG tablet Take 1 tablet (30 mg total) by mouth 2 (two) times daily. 180 tablet 3   melatonin 5 MG TABS Take 1 tablet (5 mg total) by mouth at bedtime.  0   metFORMIN (GLUCOPHAGE) 500 MG tablet Take 1 tablet (500 mg total) by mouth every evening. 90 tablet 3   nitroGLYCERIN (NITROSTAT) 0.4 MG SL tablet Place 1 tablet (0.4 mg total) under the tongue every 5 (five) minutes as needed for chest pain. 25 tablet 3   nystatin (MYCOSTATIN) 100000 UNIT/ML suspension Take 5 mLs (500,000 Units total) by mouth 4 (four) times daily. 120 mL 0   OneTouch Delica Lancets 33G MISC Use 1x a day 100 each 3   OXYGEN Inhale into the lungs. 2lpm     potassium chloride (KLOR-CON) 10 MEQ tablet Take 1 tablet (10 mEq total) by mouth daily. 90 tablet 3   rosuvastatin (CRESTOR) 10 MG tablet TAKE 1 TABLET BY MOUTH EVERY OTHER DAY 45 tablet 2   Spacer/Aero Chamber Mouthpiece MISC Use with  inhaler as needed.  J44.9 1 each 0   torsemide (DEMADEX) 20 MG tablet Take 2 tablets (40 mg total) by mouth daily. Take an additional 20 mg daily as needed for weight greater then 230 pounds 270 tablet 1   Zinc Oxide (TRIPLE PASTE) 12.8 % ointment Apply 1 Application topically as needed for irritation. Every shift.     No current facility-administered medications on file prior to visit.   Allergies  Allergen Reactions   Antihistamines, Diphenhydramine-Type Other (See Comments)    Able to tolerate only allegra.    Incruse Ellipta [Umeclidinium Bromide] Cough    Caused voice change and severe coughing   Clarithromycin Other (See Comments)    REACTION: diff swallowing and mouth blisters   Codeine Other (See Comments)    Unknown    Diphenhydramine    Pregabalin Other (See Comments)    Lack of  effect for neuropathy pain.   Sulfonamide Derivatives    Tiotropium Bromide Monohydrate Other (See Comments)    Voice changes   Umeclidinium    Vraylar [Cariprazine]     intolerant   Family History  Problem Relation Age of Onset   Heart disease Mother    Thyroid disease Mother    Emphysema Father        One lung   Cystic fibrosis Sister    Hyperthyroidism Sister    Osteoarthritis Brother    Hyperthyroidism Brother    Esophageal cancer Neg Hx    Cancer Neg Hx        Head or neck   Colon cancer Neg Hx    Stomach cancer  Neg Hx    Breast cancer Neg Hx    PE: BP 120/78   Pulse 60   LMP  (LMP Unknown)   SpO2 92%  Pt. In wheelchair, was not weighed.   Wt Readings from Last 3 Encounters:  09/06/22 228 lb (103.4 kg)  08/31/22 225 lb (102.1 kg)  08/23/22 226 lb 8 oz (102.7 kg)   Constitutional: overweight, in NAD, + walks with a walker- on 3 L oxygen now Eyes: EOMI, no exophthalmos ENT: no thyromegaly, no cervical lymphadenopathy Cardiovascular: RRR, No RG, +1/6 SEM, + R LE edema Respiratory: CTA B Musculoskeletal: no deformities Skin: + right LE erythema on the lower leg-chronic after surgery on her right knee Neurological: + tremor with outstretched hands  ASSESSMENT: 1. DM2, non-insulin-dependent, controlled, with complications - mild CKD  2. Hypothyroidism  3. HL  PLAN:  1. Patient with previously uncontrolled type 2 diabetes, on low-dose metformin, with good control more recently.  At last visit, HbA1c was slightly higher, but still at goal, at 5.3%.  She does have iron deficiency anemia which can influence these results, though.  At last visit, reviewing her blood sugar checks, they were at goal or slightly higher.  We discussed about the importance of checking these consistently and rotating blood sugar checks, but I did not suggest a change in regimen.  I refilled her metformin dose. - at today's visit, sugars remain well controlled >> will continue the same  regimen  - refilled her metformin. - as she is complaining of the need to see multiple providers and as her DM is well controlled, we discussed that she can follow up with Dr. Para March if he agrees. She agrees with the plan. - I advised her to: Please continue Synthroid 125 mcg daily.  Take the thyroid hormone every day, with water, at least 30 minutes before breakfast, separated by at least 4 hours from: - acid reflux medications - calcium - iron - multivitamins  Please continue: - Metformin 500 mg with supper  Please stop at the lab.  Please return in 6 months with your sugar log.   - we checked her HbA1c: 6.0% (lower) - advised to check sugars at different times of the day - 1x a day, rotating check times - advised for yearly eye exams >> she is due - return to clinic in 6 months  2. Hypothyroidism - latest thyroid labs reviewed with pt. >> normal: Lab Results  Component Value Date   TSH 0.899 06/30/2022  - she continues on Synthroid d.a.w. 125 mcg daily - pt feels good on this dose. - we discussed about taking the thyroid hormone every day, with water, >30 minutes before breakfast, separated by >4 hours from acid reflux medications, calcium, iron, multivitamins. Pt. is taking it correctly. - refilled her LT4.  3. HL -Reviewed latest lipid panel from 08/2021: Fractions at goal: Lab Results  Component Value Date   CHOL 140 08/06/2021   HDL 57.80 08/06/2021   LDLCALC 62 08/06/2021   LDLDIRECT 63.0 07/29/2015   TRIG 99.0 08/06/2021   CHOLHDL 2 08/06/2021  -She continues Crestor 10 mg daily and Zetia 10 mg daily without side effects -will check lipids today - and add labs requested by the cancer center per her a dn her daughter's request  Carlus Pavlov, MD PhD [facility] Endocrin[facility]

## 2022-09-15 ENCOUNTER — Ambulatory Visit: Payer: Medicare Other | Admitting: Family

## 2022-09-15 ENCOUNTER — Other Ambulatory Visit: Payer: Self-pay | Admitting: Physician Assistant

## 2022-09-15 ENCOUNTER — Encounter: Payer: Self-pay | Admitting: Family

## 2022-09-15 ENCOUNTER — Other Ambulatory Visit
Admission: RE | Admit: 2022-09-15 | Discharge: 2022-09-15 | Disposition: A | Discharge status: Home / Self Care | Payer: Medicare Other | Source: Ambulatory Visit | Attending: Family | Admitting: Family

## 2022-09-15 VITALS — BP 110/40 | HR 57 | Wt 233.8 lb

## 2022-09-15 DIAGNOSIS — J4489 Other specified chronic obstructive pulmonary disease: Secondary | ICD-10-CM

## 2022-09-15 DIAGNOSIS — I480 Paroxysmal atrial fibrillation: Secondary | ICD-10-CM | POA: Diagnosis not present

## 2022-09-15 DIAGNOSIS — I5032 Chronic diastolic (congestive) heart failure: Secondary | ICD-10-CM | POA: Insufficient documentation

## 2022-09-15 DIAGNOSIS — I872 Venous insufficiency (chronic) (peripheral): Secondary | ICD-10-CM

## 2022-09-15 DIAGNOSIS — D649 Anemia, unspecified: Secondary | ICD-10-CM

## 2022-09-15 DIAGNOSIS — E1122 Type 2 diabetes mellitus with diabetic chronic kidney disease: Secondary | ICD-10-CM | POA: Diagnosis not present

## 2022-09-15 DIAGNOSIS — I10 Essential (primary) hypertension: Secondary | ICD-10-CM

## 2022-09-15 DIAGNOSIS — J439 Emphysema, unspecified: Secondary | ICD-10-CM | POA: Diagnosis not present

## 2022-09-15 DIAGNOSIS — N1832 Chronic kidney disease, stage 3b: Secondary | ICD-10-CM

## 2022-09-15 LAB — BASIC METABOLIC PANEL
Anion gap: 7 (ref 5–15)
BUN: 35 mg/dL — ABNORMAL HIGH (ref 8–23)
CO2: 37 mmol/L — ABNORMAL HIGH (ref 22–32)
Calcium: 8.7 mg/dL — ABNORMAL LOW (ref 8.9–10.3)
Chloride: 98 mmol/L (ref 98–111)
Creatinine, Ser: 1.46 mg/dL — ABNORMAL HIGH (ref 0.44–1.00)
GFR, Estimated: 35 mL/min — ABNORMAL LOW (ref >60)
Glucose, Bld: 121 mg/dL — ABNORMAL HIGH (ref 70–99)
Potassium: 4 mmol/L (ref 3.5–5.1)
Sodium: 142 mmol/L (ref 135–145)

## 2022-09-15 MED ORDER — FERROUS SULFATE 325 (65 FE) MG PO TABS
325.0000 mg | ORAL_TABLET | Freq: Every day | ORAL | 3 refills | Status: DC
Start: 2022-09-15 — End: 2023-01-08

## 2022-09-15 NOTE — Patient Instructions (Signed)
[facility] to [facility] to get your lab work done.  

## 2022-09-15 NOTE — Progress Notes (Signed)
PCP: Joaquim Nam, MD (last seen 05/24) Primary Cardiologist: Julien Nordmann, MD (last seen 06/24)  HPI:   Alexandria [name] is a 87 y/o female with a history of lung cancer, DM, hyperlipidemia, HTN, thyroid disease, anxiety, COPD, GERD, iron deficiency anemia, mild AS, OSA, PAF (04/24), previous tobacco use and chronic heart failure.   Echo 06/30/22: EF 60-65% along with mild MR and mild AS with aortic valve mean gradient of 15.0 mmHg Echo 03/29/22: EF of 55-60% with calcification on mitral valve and mild AS.  Echo 02/11/21: EF of 55-60% along with mild/ moderate LAE and moderate/ severe mitral annular calcification.   Admitted 06/29/22 due to AMS and worsening lethargy and fatigue. Sats had dropped to 73% and placed on bipap. Prednisone and IV solumedrol given. Developed PAF and started on amiodarone. Zio to be done. + UTI. Was in the ED 03/04/22 due to LBP. Was in the ED 02/17/22 due to peripheral edema.   She presents today for a HF f/u visit with a chief complaint of moderate SOB with little exertion. Chronic in nature. Has associated lightheadedness, bilateral pedal edema and chronic difficulty sleeping along with this. Denies chest pain, palpitations, abdominal distention or dizziness.   Sleeps in a chair and sleeps for a few hours at a time. Hasn't slept in the bed in a year.  Wearing thigh high compression socks. Wearing oxygen at 2L around the clock.   Glucose was 121 this morning. Has parameters to take an additional diuretic dose if home weight is > 230 pounds and her daughter says that they've only done this twice in the last 3 weeks.   ROS: All systems negative except as listed in HPI, PMH and Problem List.  SH:  Social History   Socioeconomic History   Marital status: Widowed    Spouse name: Not on file   Number of children: 2   Years of education: Not on file   Highest education level: Not on file  Occupational History   Occupation: Retired    Associate Professor: RETIRED  Tobacco Use    Smoking status: Former    Packs/day: 2.00    Years: 50.00    Additional pack years: 0.00    Total pack years: 100.00    Types: Cigarettes    Quit date: 02/05/2005    Years since quitting: 17.6   Smokeless tobacco: Never  Vaping Use   Vaping Use: Never used  Substance and Sexual Activity   Alcohol use: Yes    Comment: occasionally   Drug use: Never   Sexual activity: Not Currently  Other Topics Concern   Not on file  Social History Narrative   Widowed 2023, 2 children; Enjoys painting      Quit smoking in 2007; no alcohol; lives in [place]; [name]/d lives in [place]. Had own business. Dont drive- sec to neuropathy.    Social Determinants of Health   Financial Resource Strain: Not on file  Food Insecurity: No Food Insecurity (06/30/2022)   Hunger Vital Sign    Worried About Running Out of Food in the Last Year: Never true    Ran Out of Food in the Last Year: Never true  Transportation Needs: No Transportation Needs (06/30/2022)   PRAPARE - Administrator, Civil Service (Medical): No    Lack of Transportation (Non-Medical): No  Physical Activity: Not on file  Stress: Not on file  Social Connections: Not on file  Intimate Partner Violence: Not At Risk (06/30/2022)   Humiliation, Afraid,  Rape, and Kick questionnaire    Fear of Current or Ex-Partner: No    Emotionally Abused: No    Physically Abused: No    Sexually Abused: No    FH:  Family History  Problem Relation Age of Onset   Heart disease Mother    Thyroid disease Mother    Emphysema Father        One lung   Cystic fibrosis Sister    Hyperthyroidism Sister    Osteoarthritis Brother    Hyperthyroidism Brother    Esophageal cancer Neg Hx    Cancer Neg Hx        Head or neck   Colon cancer Neg Hx    Stomach cancer Neg Hx    Breast cancer Neg Hx     Past Medical History:  Diagnosis Date   Allergy, unspecified not elsewhere classified    Anxiety    Aortic stenosis, mild 03/30/2022   a. TTE  03/29/2022: EF 55-60, no RWMA, GR 1 DD, normal RVSF, mild aortic stenosis (mean 10, V-max 212 cm/s, DI 0.70)   Cataract    Chronic diastolic congestive heart failure (HCC)    a. 06/2022 Echo: EF 60-65%, no rwma, nl RV fxn, mild MR, mild AS.   COPD (chronic obstructive pulmonary disease) (HCC) 03/08/1998   PFTs 12/12/2002 FEV 1 1.42 (64%) ratio 58 with no better after B2 and DLCO75%; PFTs 11/20/09 FEV1 1.50 (73%) ratio 50 no better after B2 with DLCO 62%; Hfa 50% 11/20/2009 >75%, 01/13/10 p coaching   Coronary artery calcification seen on CT scan    a. 2012 Neg stress test.   Depression 12/07/1998   Diabetes mellitus type II 02/05/2006   Dr. Elvera Lennox with endo   Disorders of bursae and tendons in shoulder region, unspecified    Rotator cuff syndrome, right   E. coli bacteremia    Esophagitis    GERD (gastroesophageal reflux disease)    History of UTI    Hyperlipidemia 10/04/2000   Hypertension 03/08/1992   Hypothyroidism 03/08/1968   Iron deficiency anemia    Lung nodule    radiation starts 10-08-2019   Malignant neoplasm of right upper lobe of lung (HCC) 07/24/2019   Obesity    NOS   OSA (obstructive sleep apnea)    PSG 01/27/10 AHI 13, pt does not know CPAP settings   PAF (paroxysmal atrial fibrillation) (HCC)    a. 06/2022 in setting of UTI/encephalopathy-->converted on amio; b. CHA2DS2VASc = 6-->No OAC 2/2 chronic anemia/fall risk; c. 06/2022 Zio: Predominantly sinus bradycardia at 54 (3-158)'s.  1 brief run of SVT.  2.2% PVC burden.   Peripheral neuropathy    Likely due to DM per Dr. Tresa Endo hernia     Current Outpatient Medications  Medication Sig Dispense Refill   acetaminophen (TYLENOL) 325 MG tablet Take 2 tablets (650 mg total) by mouth every 6 (six) hours as needed for mild pain, headache, fever or moderate pain.     albuterol (PROAIR HFA) 108 (90 Base) MCG/ACT inhaler Inhale 2 puffs into the lungs every 6 (six) hours as needed for wheezing or shortness of breath. INHALE 2  PUFFS INTO THE LUNGS EVERY 6 HOURS AS NEEDED FOR WHEEZING OR SHORTNESS OF BREATH 3 Inhaler 3   amiodarone (PACERONE) 200 MG tablet Take 1 tablet (200 mg total) by mouth daily. 90 tablet 1   arformoterol (BROVANA) 15 MCG/2ML NEBU Take 2 mLs (15 mcg total) by nebulization at bedtime. 120 mL 6   bisoprolol (  ZEBETA) 5 MG tablet Take 0.5 tablets (2.5 mg total) by mouth daily. 45 tablet 1   budesonide (PULMICORT) 0.5 MG/2ML nebulizer solution Take 2 mLs (0.5 mg total) by nebulization in the morning and at bedtime. 360 mL 3   busPIRone (BUSPAR) 15 MG tablet Take 15 mg by mouth 3 (three) times daily.     Cholecalciferol (VITAMIN D) 50 MCG (2000 UT) tablet Take 1 tablet (2,000 Units total) by mouth daily.     Coenzyme Q-10 200 MG CAPS Take 200 mg by mouth daily.     Cyanocobalamin (B-12) 5000 MCG CAPS Take 5,000 mcg by mouth daily.     doxycycline (VIBRAMYCIN) 100 MG capsule Take 1 capsule (100 mg total) by mouth 2 (two) times daily. 14 capsule 0   esomeprazole (NEXIUM) 40 MG capsule Take 1 capsule (40 mg total) by mouth daily at 12 noon. 30 capsule 11   ezetimibe (ZETIA) 10 MG tablet TAKE 1 TABLET BY MOUTH EVERYDAY AT BEDTIME 90 tablet 2   ferrous sulfate 325 (65 FE) MG tablet TAKE 1 TABLET BY MOUTH EVERY DAY 90 tablet 3   FLUoxetine (PROZAC) 20 MG tablet Take 20 mg by mouth 2 (two) times daily.     glucose blood (ONETOUCH VERIO) test strip USE AS INSTRUCTED TO CHECK SUGAR 1 TIME DAILY 100 strip 3   guaiFENesin (MUCINEX) 600 MG 12 hr tablet Take 2 tablets (1,200 mg total) by mouth 2 (two) times daily as needed for cough or to loosen phlegm.     isosorbide mononitrate (IMDUR) 30 MG 24 hr tablet Take 30 mg by mouth daily.     levothyroxine (SYNTHROID) 125 MCG tablet Take 1 tablet (125 mcg total) by mouth daily. 90 tablet 3   magnesium 30 MG tablet Take 1 tablet (30 mg total) by mouth 2 (two) times daily. 180 tablet 3   melatonin 5 MG TABS Take 1 tablet (5 mg total) by mouth at bedtime.  0   metFORMIN  (GLUCOPHAGE) 500 MG tablet Take 1 tablet (500 mg total) by mouth every evening. 90 tablet 3   nitroGLYCERIN (NITROSTAT) 0.4 MG SL tablet Place 1 tablet (0.4 mg total) under the tongue every 5 (five) minutes as needed for chest pain. 25 tablet 3   nystatin (MYCOSTATIN) 100000 UNIT/ML suspension Take 5 mLs (500,000 Units total) by mouth 4 (four) times daily. 120 mL 0   OneTouch Delica Lancets 33G MISC Use 1x a day 100 each 3   OXYGEN Inhale into the lungs. 2lpm     potassium chloride (KLOR-CON) 10 MEQ tablet Take 1 tablet (10 mEq total) by mouth daily. 90 tablet 3   rosuvastatin (CRESTOR) 10 MG tablet TAKE 1 TABLET BY MOUTH EVERY OTHER DAY 45 tablet 2   Spacer/Aero Chamber Mouthpiece MISC Use with  inhaler as needed.  J44.9 1 each 0   torsemide (DEMADEX) 20 MG tablet Take 2 tablets (40 mg total) by mouth daily. Take an additional 20 mg daily as needed for weight greater then 230 pounds 270 tablet 1   Zinc Oxide (TRIPLE PASTE) 12.8 % ointment Apply 1 Application topically as needed for irritation. Every shift.     No current facility-administered medications for this visit.   Vitals:   09/15/22 1338  BP: (!) 110/40  Pulse: (!) 57  SpO2: 97%  Weight: 233 lb 12.8 oz (106.1 kg)   Wt Readings from Last 3 Encounters:  09/15/22 233 lb 12.8 oz (106.1 kg)  09/06/22 228 lb (103.4 kg)  08/31/22  225 lb (102.1 kg)   Lab Results  Component Value Date   CREATININE 1.46 (H) 09/15/2022   CREATININE 1.33 (H) 08/19/2022   CREATININE 1.22 (H) 08/09/2022   PHYSICAL EXAM:  General:  Well appearing. No resp difficulty HEENT: normal Neck: supple. JVP flat. Carotids 2+ bilaterally; no bruits. No lymphadenopathy or thryomegaly appreciated. Cor: PMI normal. Regular rhythm. Bradycardic. No rubs, gallops or murmurs. Lungs: sporadic expiratory wheezes Abdomen: soft, nontender, nondistended. No hepatosplenomegaly. No bruits or masses. Good bowel sounds. Extremities: no cyanosis, clubbing, rash, 1+ pitting  edema bilateral lower legs Neuro: alert & oriented x3, cranial nerves grossly intact. Moves all 4 extremities w/o difficulty. Affect pleasant.   ECG: not done   ASSESSMENT & PLAN:  1: NICM with preserved ejection fraction- - likely due to HTN/ COPD - NYHA class III - euvolemic  - weighing daily and reports a stable weight; reminded to call for an overnight weight gain of > 2 pounds or a weekly weight gain of > 5 pounds - has parameters that she can take an extra diuretic if weight is > 230 pounds - Echo 06/30/22: EF 60-65% along with mild MR and mild AS with aortic valve mean gradient of 15.0 mmHg - echo 03/29/22: EF of 55-60% with calcification on mitral valve and mild AS.  - Echo 02/11/21: EF of 55-60% along with mild/ moderate LAE and moderate/ severe mitral annular calcification.  - adds salt to tomato sandwiches but says that is all; understands to keep it 2000mg  - saw cardiology Mariah Milling) 06/24 - h/o UTI so may not be a good SGLT2 candidate - continue torsemide 40mg  daily with extra 20mg  if needed for weight gain - continue potassium daily - consider bisoprolol 2.5mg  daily - BP will not tolerate spironolactone - BNP 06/29/22 was 245.1  2: HTN- - BP 110/40 - saw PCP Para March) 05/24 - BMP 08/19/22 showed sodium 143, potassium 4.5, creatinine 1.33 & GFR  - BMP today  3: COPD- - wearing oxygen at 3L RTC  - sees pulmonology next week  4: DM- - saw endocrinology Elvera Lennox) 07/24 - A1c 03/16/22 was 5.3% - continue metformin 500mg  daily  5: Chronic venous insufficiency- - wearing thigh high compression socks - not elevating her legs consistently and admits to not propping them up higher than her heart - encouraged to elevate them as much as possible & as high as possible - saw vascular Cynda Acres) 06/24  6: PAF- - rate controlled - continue bisoprolol 2.5mg  daily - continue amiodarone 200mg  daily - not on anticoag due to risk of falling  Return in 3 months, sooner if  needed.

## 2022-09-16 ENCOUNTER — Encounter: Payer: Self-pay | Admitting: Internal Medicine

## 2022-09-16 ENCOUNTER — Ambulatory Visit (HOSPITAL_COMMUNITY): Payer: Medicare Other | Admitting: Nurse Practitioner

## 2022-09-17 ENCOUNTER — Ambulatory Visit (HOSPITAL_COMMUNITY)
Admission: RE | Admit: 2022-09-17 | Discharge: 2022-09-17 | Disposition: A | Discharge status: Home / Self Care | Payer: Medicare Other | Source: Ambulatory Visit | Attending: Radiation Oncology | Admitting: Radiation Oncology

## 2022-09-17 DIAGNOSIS — R062 Wheezing: Secondary | ICD-10-CM | POA: Diagnosis not present

## 2022-09-17 DIAGNOSIS — I700 Atherosclerosis of aorta: Secondary | ICD-10-CM | POA: Diagnosis not present

## 2022-09-17 DIAGNOSIS — I350 Nonrheumatic aortic (valve) stenosis: Secondary | ICD-10-CM

## 2022-09-17 DIAGNOSIS — C3411 Malignant neoplasm of upper lobe, right bronchus or lung: Secondary | ICD-10-CM

## 2022-09-17 DIAGNOSIS — L989 Disorder of the skin and subcutaneous tissue, unspecified: Secondary | ICD-10-CM | POA: Diagnosis not present

## 2022-09-17 DIAGNOSIS — G9340 Encephalopathy, unspecified: Secondary | ICD-10-CM | POA: Diagnosis not present

## 2022-09-17 DIAGNOSIS — E662 Morbid (severe) obesity with alveolar hypoventilation: Secondary | ICD-10-CM

## 2022-09-17 DIAGNOSIS — E66813 Obesity, class 3: Secondary | ICD-10-CM

## 2022-09-17 DIAGNOSIS — L97818 Non-pressure chronic ulcer of other part of right lower leg with other specified severity: Secondary | ICD-10-CM | POA: Diagnosis not present

## 2022-09-17 DIAGNOSIS — F4321 Adjustment disorder with depressed mood: Secondary | ICD-10-CM

## 2022-09-17 DIAGNOSIS — I5032 Chronic diastolic (congestive) heart failure: Secondary | ICD-10-CM | POA: Diagnosis present

## 2022-09-17 DIAGNOSIS — M79671 Pain in right foot: Secondary | ICD-10-CM | POA: Diagnosis present

## 2022-09-17 DIAGNOSIS — R209 Unspecified disturbances of skin sensation: Secondary | ICD-10-CM

## 2022-09-17 DIAGNOSIS — I5033 Acute on chronic diastolic (congestive) heart failure: Secondary | ICD-10-CM

## 2022-09-17 DIAGNOSIS — K449 Diaphragmatic hernia without obstruction or gangrene: Secondary | ICD-10-CM

## 2022-09-17 DIAGNOSIS — R0789 Other chest pain: Secondary | ICD-10-CM

## 2022-09-17 DIAGNOSIS — Z87828 Personal history of other (healed) physical injury and trauma: Secondary | ICD-10-CM | POA: Diagnosis not present

## 2022-09-17 DIAGNOSIS — R42 Dizziness and giddiness: Secondary | ICD-10-CM | POA: Diagnosis present

## 2022-09-17 DIAGNOSIS — N3090 Cystitis, unspecified without hematuria: Secondary | ICD-10-CM

## 2022-09-17 DIAGNOSIS — R519 Headache, unspecified: Secondary | ICD-10-CM | POA: Diagnosis present

## 2022-09-17 DIAGNOSIS — R600 Localized edema: Secondary | ICD-10-CM | POA: Diagnosis present

## 2022-09-17 DIAGNOSIS — K5730 Diverticulosis of large intestine without perforation or abscess without bleeding: Secondary | ICD-10-CM

## 2022-09-17 DIAGNOSIS — K4190 Unilateral femoral hernia, without obstruction or gangrene, not specified as recurrent: Secondary | ICD-10-CM

## 2022-09-17 DIAGNOSIS — R5381 Other malaise: Secondary | ICD-10-CM | POA: Diagnosis not present

## 2022-09-17 DIAGNOSIS — E875 Hyperkalemia: Secondary | ICD-10-CM

## 2022-09-17 DIAGNOSIS — M67919 Unspecified disorder of synovium and tendon, unspecified shoulder: Secondary | ICD-10-CM | POA: Diagnosis present

## 2022-09-17 DIAGNOSIS — R011 Cardiac murmur, unspecified: Secondary | ICD-10-CM

## 2022-09-17 DIAGNOSIS — E1140 Type 2 diabetes mellitus with diabetic neuropathy, unspecified: Secondary | ICD-10-CM | POA: Diagnosis present

## 2022-09-17 DIAGNOSIS — J9622 Acute and chronic respiratory failure with hypercapnia: Secondary | ICD-10-CM | POA: Insufficient documentation

## 2022-09-17 DIAGNOSIS — R051 Acute cough: Secondary | ICD-10-CM

## 2022-09-17 DIAGNOSIS — G8929 Other chronic pain: Secondary | ICD-10-CM | POA: Diagnosis present

## 2022-09-17 DIAGNOSIS — I480 Paroxysmal atrial fibrillation: Secondary | ICD-10-CM

## 2022-09-17 DIAGNOSIS — J029 Acute pharyngitis, unspecified: Secondary | ICD-10-CM | POA: Diagnosis not present

## 2022-09-17 DIAGNOSIS — M25562 Pain in left knee: Secondary | ICD-10-CM | POA: Diagnosis present

## 2022-09-17 DIAGNOSIS — R300 Dysuria: Secondary | ICD-10-CM

## 2022-09-17 DIAGNOSIS — R1319 Other dysphagia: Secondary | ICD-10-CM | POA: Diagnosis present

## 2022-09-17 DIAGNOSIS — G4733 Obstructive sleep apnea (adult) (pediatric): Secondary | ICD-10-CM

## 2022-09-17 DIAGNOSIS — R918 Other nonspecific abnormal finding of lung field: Secondary | ICD-10-CM

## 2022-09-17 DIAGNOSIS — I493 Ventricular premature depolarization: Secondary | ICD-10-CM

## 2022-09-17 DIAGNOSIS — J9611 Chronic respiratory failure with hypoxia: Secondary | ICD-10-CM | POA: Diagnosis present

## 2022-09-17 DIAGNOSIS — M719 Bursopathy, unspecified: Secondary | ICD-10-CM | POA: Insufficient documentation

## 2022-09-17 DIAGNOSIS — E559 Vitamin D deficiency, unspecified: Secondary | ICD-10-CM | POA: Diagnosis not present

## 2022-09-17 DIAGNOSIS — K2100 Gastro-esophageal reflux disease with esophagitis, without bleeding: Secondary | ICD-10-CM | POA: Diagnosis not present

## 2022-09-17 DIAGNOSIS — J441 Chronic obstructive pulmonary disease with (acute) exacerbation: Secondary | ICD-10-CM | POA: Diagnosis present

## 2022-09-17 DIAGNOSIS — F419 Anxiety disorder, unspecified: Secondary | ICD-10-CM

## 2022-09-17 DIAGNOSIS — R002 Palpitations: Secondary | ICD-10-CM

## 2022-09-17 DIAGNOSIS — J439 Emphysema, unspecified: Secondary | ICD-10-CM | POA: Diagnosis not present

## 2022-09-17 DIAGNOSIS — M7989 Other specified soft tissue disorders: Secondary | ICD-10-CM

## 2022-09-17 DIAGNOSIS — J9621 Acute and chronic respiratory failure with hypoxia: Secondary | ICD-10-CM

## 2022-09-17 DIAGNOSIS — K222 Esophageal obstruction: Secondary | ICD-10-CM

## 2022-09-17 DIAGNOSIS — Z0000 Encounter for general adult medical examination without abnormal findings: Secondary | ICD-10-CM | POA: Diagnosis not present

## 2022-09-17 DIAGNOSIS — I25118 Atherosclerotic heart disease of native coronary artery with other forms of angina pectoris: Secondary | ICD-10-CM

## 2022-09-17 DIAGNOSIS — K209 Esophagitis, unspecified without bleeding: Secondary | ICD-10-CM

## 2022-09-17 DIAGNOSIS — R1031 Right lower quadrant pain: Secondary | ICD-10-CM

## 2022-09-17 DIAGNOSIS — J4489 Other specified chronic obstructive pulmonary disease: Secondary | ICD-10-CM | POA: Insufficient documentation

## 2022-09-17 DIAGNOSIS — R0609 Other forms of dyspnea: Secondary | ICD-10-CM | POA: Diagnosis present

## 2022-09-17 DIAGNOSIS — Z7189 Other specified counseling: Secondary | ICD-10-CM

## 2022-09-17 DIAGNOSIS — D649 Anemia, unspecified: Secondary | ICD-10-CM | POA: Diagnosis present

## 2022-09-17 DIAGNOSIS — S81011D Laceration without foreign body, right knee, subsequent encounter: Secondary | ICD-10-CM

## 2022-09-17 DIAGNOSIS — C3490 Malignant neoplasm of unspecified part of unspecified bronchus or lung: Secondary | ICD-10-CM | POA: Diagnosis not present

## 2022-09-17 DIAGNOSIS — I10 Essential (primary) hypertension: Secondary | ICD-10-CM

## 2022-09-17 DIAGNOSIS — R0982 Postnasal drip: Secondary | ICD-10-CM

## 2022-09-17 DIAGNOSIS — I739 Peripheral vascular disease, unspecified: Secondary | ICD-10-CM | POA: Diagnosis not present

## 2022-09-17 DIAGNOSIS — G903 Multi-system degeneration of the autonomic nervous system: Secondary | ICD-10-CM

## 2022-09-17 DIAGNOSIS — F339 Major depressive disorder, recurrent, unspecified: Secondary | ICD-10-CM

## 2022-09-17 LAB — POCT I-STAT CREATININE: Creatinine, Ser: 1.5 mg/dL — ABNORMAL HIGH (ref 0.44–1.00)

## 2022-09-17 MED ORDER — IOHEXOL 300 MG/ML  SOLN
75.0000 mL | Freq: Once | INTRAMUSCULAR | Status: AC | PRN
  Administered 2022-09-17: 60 mL via INTRAVENOUS

## 2022-09-19 ENCOUNTER — Other Ambulatory Visit: Payer: Self-pay | Admitting: Internal Medicine

## 2022-09-20 ENCOUNTER — Inpatient Hospital Stay: Payer: Medicare Other

## 2022-09-20 DIAGNOSIS — D631 Anemia in chronic kidney disease: Secondary | ICD-10-CM | POA: Diagnosis not present

## 2022-09-20 DIAGNOSIS — J449 Chronic obstructive pulmonary disease, unspecified: Secondary | ICD-10-CM | POA: Diagnosis not present

## 2022-09-20 DIAGNOSIS — N1832 Chronic kidney disease, stage 3b: Secondary | ICD-10-CM | POA: Diagnosis not present

## 2022-09-20 DIAGNOSIS — I130 Hypertensive heart and chronic kidney disease with heart failure and stage 1 through stage 4 chronic kidney disease, or unspecified chronic kidney disease: Secondary | ICD-10-CM | POA: Diagnosis not present

## 2022-09-20 DIAGNOSIS — Z85118 Personal history of other malignant neoplasm of bronchus and lung: Secondary | ICD-10-CM | POA: Diagnosis not present

## 2022-09-20 DIAGNOSIS — D509 Iron deficiency anemia, unspecified: Secondary | ICD-10-CM | POA: Diagnosis not present

## 2022-09-20 DIAGNOSIS — D649 Anemia, unspecified: Secondary | ICD-10-CM

## 2022-09-20 LAB — SAMPLE TO BLOOD BANK

## 2022-09-20 LAB — HEMOGLOBIN AND HEMATOCRIT (CANCER CENTER ONLY)
HCT: 26.2 % — ABNORMAL LOW (ref 36.0–46.0)
Hemoglobin: 8.1 g/dL — ABNORMAL LOW (ref 12.0–15.0)

## 2022-09-22 DIAGNOSIS — J9622 Acute and chronic respiratory failure with hypercapnia: Secondary | ICD-10-CM | POA: Diagnosis not present

## 2022-09-22 DIAGNOSIS — G903 Multi-system degeneration of the autonomic nervous system: Secondary | ICD-10-CM | POA: Diagnosis not present

## 2022-09-22 DIAGNOSIS — E1142 Type 2 diabetes mellitus with diabetic polyneuropathy: Secondary | ICD-10-CM | POA: Diagnosis not present

## 2022-09-22 DIAGNOSIS — I110 Hypertensive heart disease with heart failure: Secondary | ICD-10-CM | POA: Diagnosis not present

## 2022-09-22 DIAGNOSIS — J9621 Acute and chronic respiratory failure with hypoxia: Secondary | ICD-10-CM | POA: Diagnosis not present

## 2022-09-22 DIAGNOSIS — I480 Paroxysmal atrial fibrillation: Secondary | ICD-10-CM | POA: Diagnosis not present

## 2022-09-23 ENCOUNTER — Ambulatory Visit
Admission: RE | Admit: 2022-09-23 | Discharge: 2022-09-23 | Disposition: A | Discharge status: Home / Self Care | Payer: Medicare Other | Source: Ambulatory Visit | Attending: Emergency Medicine | Admitting: Emergency Medicine

## 2022-09-23 ENCOUNTER — Other Ambulatory Visit: Payer: Self-pay | Admitting: Emergency Medicine

## 2022-09-23 ENCOUNTER — Ambulatory Visit: Payer: PRIVATE HEALTH INSURANCE | Admitting: Nurse Practitioner

## 2022-09-23 VITALS — BP 117/70 | HR 62 | Temp 97.7°F | Resp 18

## 2022-09-23 DIAGNOSIS — R350 Frequency of micturition: Secondary | ICD-10-CM | POA: Diagnosis not present

## 2022-09-23 DIAGNOSIS — R300 Dysuria: Secondary | ICD-10-CM | POA: Insufficient documentation

## 2022-09-23 DIAGNOSIS — Z79899 Other long term (current) drug therapy: Secondary | ICD-10-CM

## 2022-09-23 LAB — POCT URINALYSIS DIP (MANUAL ENTRY)
Bilirubin, UA: NEGATIVE
Blood, UA: NEGATIVE
Glucose, UA: NEGATIVE mg/dL
Ketones, POC UA: NEGATIVE mg/dL
Nitrite, UA: NEGATIVE
Protein Ur, POC: NEGATIVE mg/dL
Spec Grav, UA: 1.015 (ref 1.010–1.025)
Urobilinogen, UA: 0.2 E.U./dL
pH, UA: 6 (ref 5.0–8.0)

## 2022-09-23 MED ORDER — CEPHALEXIN 500 MG PO CAPS: 500.0000 mg | ORAL_CAPSULE | Freq: Two times a day (BID) | ORAL | 0 refills | Status: DC

## 2022-09-23 NOTE — ED Provider Notes (Signed)
[facility]    CSN: [id number] Arrival date & time: 09/23/22  1420      History   Chief Complaint Chief Complaint  Patient presents with   Dysuria    HPI Alexandria [name] is a 87 y.o. female.  Accompanied by a caregiver, patient presents with dysuria, urinary urgency, urinary frequency x 2 days.  No fever, abdominal pain, flank pain, or other symptoms.  No OTC medications taken.  Her medical history includes hypertension, heart failure, atrial fibrillation, COPD, lung cancer, chronic respiratory failure with hypoxia.  The history is provided by the patient, a caregiver and medical records.    Past Medical History:  Diagnosis Date   Allergy, unspecified not elsewhere classified    Anxiety    Aortic stenosis, mild 03/30/2022   a. TTE 03/29/2022: EF 55-60, no RWMA, GR 1 DD, normal RVSF, mild aortic stenosis (mean 10, V-max 212 cm/s, DI 0.70)   Cataract    Chronic diastolic congestive heart failure (HCC)    a. 06/2022 Echo: EF 60-65%, no rwma, nl RV fxn, mild MR, mild AS.   COPD (chronic obstructive pulmonary disease) (HCC) 03/08/1998   PFTs 12/12/2002 FEV 1 1.42 (64%) ratio 58 with no better after B2 and DLCO75%; PFTs 11/20/09 FEV1 1.50 (73%) ratio 50 no better after B2 with DLCO 62%; Hfa 50% 11/20/2009 >75%, 01/13/10 p coaching   Coronary artery calcification seen on CT scan    a. 2012 Neg stress test.   Depression 12/07/1998   Diabetes mellitus type II 02/05/2006   Dr. Elvera Lennox with endo   Disorders of bursae and tendons in shoulder region, unspecified    Rotator cuff syndrome, right   E. coli bacteremia    Esophagitis    GERD (gastroesophageal reflux disease)    History of UTI    Hyperlipidemia 10/04/2000   Hypertension 03/08/1992   Hypothyroidism 03/08/1968   Iron deficiency anemia    Lung nodule    radiation starts 10-08-2019   Malignant neoplasm of right upper lobe of lung (HCC) 07/24/2019   Obesity    NOS   OSA (obstructive sleep apnea)    PSG 01/27/10 AHI  13, pt does not know CPAP settings   PAF (paroxysmal atrial fibrillation) (HCC)    a. 06/2022 in setting of UTI/encephalopathy-->converted on amio; b. CHA2DS2VASc = 6-->No OAC 2/2 chronic anemia/fall risk; c. 06/2022 Zio: Predominantly sinus bradycardia at 54 (3-158)'s.  1 brief run of SVT.  2.2% PVC burden.   Peripheral neuropathy    Likely due to DM per Dr. Tresa Endo hernia     Patient Active Problem List   Diagnosis Date Noted   Neurogenic orthostatic hypotension (HCC) 07/13/2022   Non-pressure chronic ulcer of other part of right lower leg with other specified severity (HCC) 07/13/2022   Atherosclerotic heart disease of native coronary artery with other forms of angina pectoris (HCC) 07/13/2022   Paroxysmal atrial fibrillation (HCC) 07/02/2022   Acute on chronic respiratory failure with hypoxia and hypercapnia (HCC) 06/29/2022   Acute encephalopathy 06/29/2022   Aortic stenosis, mild 03/30/2022   PVC's (premature ventricular contractions) 02/23/2022   Skin lesion 08/12/2021   Leg swelling 06/08/2021   Grief 05/27/2021   H/O laceration of skin 05/27/2021   PND (post-nasal drip) 04/27/2021   Wheezing 04/27/2021   Acute cough 04/27/2021   Physical deconditioning 01/30/2020   Healthcare maintenance 01/30/2020   PAD (peripheral artery disease) (HCC) 01/27/2020   Sore throat 11/21/2019   GERD (gastroesophageal reflux disease) 11/21/2019  CAD (coronary artery disease) 07/24/2019   Malignant neoplasm of right upper lobe of lung (HCC) 07/24/2019   Vitamin D deficiency 06/29/2018   Hyperkalemia 06/28/2018   Dysuria 01/01/2018   Abnormal findings on diagnostic imaging of lung 12/18/2017   Rhinitis 12/18/2017   Knee laceration, right, subsequent encounter 10/06/2016   Knee pain, acute 10/06/2016   Chronic pain of left knee 09/15/2016   Right foot pain 09/15/2016   Chronic respiratory failure with hypoxia (HCC) 07/14/2016   Diverticulosis of colon without hemorrhage     Hemorrhoids    Hiatal hernia    Type 2 diabetes mellitus with diabetic neuropathy, without long-term current use of insulin (HCC) 04/04/2015   Left temporal headache 03/14/2015   Vertigo 03/14/2015   Chest tightness 12/04/2014   Cystitis 11/03/2014   Acute on chronic heart failure with preserved ejection fraction (HFpEF) (HCC)    Iron deficiency anemia    Obesity hypoventilation syndrome (HCC)    Chronic diastolic CHF (congestive heart failure) (HCC)    Acute cystitis    Symptomatic anemia    Medicare annual wellness visit, subsequent 06/14/2014   Advance care planning 06/14/2014   RLQ abdominal pain 05/10/2014   COPD exacerbation (HCC) 04/28/2014   Lower extremity edema 09/15/2012   DOE (dyspnea on exertion) 08/24/2012   Shoulder pain 12/17/2011   Abdominal hernia 12/17/2011   Anxiety 09/23/2011   Osteopenia 07/25/2011   Obstructive sleep apnea 02/09/2010   Murmur 02/04/2010   CHEST PAIN 01/13/2010   PALPITATIONS 08/29/2008   ESOPHAGEAL STENOSIS 11/22/2007   DYSPHAGIA 11/08/2007   ESOPHAGITIS 10/11/2007   Edema 07/03/2007   COPD with chronic bronchitis and emphysema (HCC) 05/23/2007   ALLERGY 07/25/2006   NUMBNESS 10/06/2001   Hyperlipemia 10/04/2000   Depression, recurrent (HCC) 03/08/1997   Essential hypertension 03/08/1992   Obesity, Class III, BMI 40-49.9 (morbid obesity) (HCC) 03/08/1989   ROTATOR CUFF SYNDROME, RIGHT 03/08/1982   Hypothyroidism 03/08/1968    Past Surgical History:  Procedure Laterality Date   ABD U/S  03/19/1999   Nml x2 foci in liver   ADENOSINE MYOVIEW  06/02/2007   Nml   CARDIOLITE PERSANTINE  08/24/2000   Nml   CAROTID U/S  08/24/2000   1-39% ICA stenosis   CAROTID U/S  06/02/2007   No apprec change    CARPAL TUNNEL RELEASE  12/1997   Right   CESAREAN SECTION     x2 [place]/ repeat   CHOLECYSTECTOMY  1997   COLONOSCOPY WITH PROPOFOL N/A 02/12/2016   Procedure: COLONOSCOPY WITH PROPOFOL;  Surgeon: Rachael Fee, MD;  Location: [facility]  ENDOSCOPY;  Service: Endoscopy;  Laterality: N/A;   CT ABD W & PELVIS WO/W CM     Abd hemangiomas of liver, 1 cm R renal cyst   DENTAL SURGERY  2016   Implants   DEXA  07/03/2003   Nml   ESOPHAGOGASTRODUODENOSCOPY  12/05/1997   Nml (due to hoarseness)   ESOPHAGOGASTRODUODENOSCOPY (EGD) WITH PROPOFOL N/A 02/12/2016   Procedure: ESOPHAGOGASTRODUODENOSCOPY (EGD) WITH PROPOFOL;  Surgeon: Rachael Fee, MD;  Location: [facility] ENDOSCOPY;  Service: Endoscopy;  Laterality: N/A;   GALLBLADDER SURGERY     HERNIA REPAIR  01/24/2009   Lap Ventr w/ Lysis of adhesions (Dr. Dwain Sarna)   knee arthroscopic surgery  years ago   right   ROTATOR CUFF REPAIR  1984   Right, [place]   SHOULDER OPEN ROTATOR CUFF REPAIR  02/08/2012   Procedure: ROTATOR CUFF REPAIR SHOULDER OPEN;  Surgeon: Drucilla Schmidt, MD;  Location: [facility] ORS;  Service: Orthopedics;  Laterality: Left;  Left Shoulder Open Anterior Acrominectomy Rotator Cuff Repair Open Distal Clavicle Resection ,tissue mend graft, and repair of biceps tendon   THUMB RELEASE  12/1997   Right   TONSILLECTOMY     TOTAL ABDOMINAL HYSTERECTOMY  1985   Due to dysmennorhea   US ECHOCARDIOGRAPHY  06/02/2007    OB History   No obstetric history on file.      Home Medications    Prior to Admission medications   Medication Sig Start Date End Date Taking? Authorizing Provider  cephALEXin (KEFLEX) 500 MG capsule Take 1 capsule (500 mg total) by mouth 2 (two) times daily for 5 days. 09/23/22 09/28/22 Yes Mickie Bail, NP  acetaminophen (TYLENOL) 325 MG tablet Take 2 tablets (650 mg total) by mouth every 6 (six) hours as needed for mild pain, headache, fever or moderate pain. 07/07/22   Gillis Santa, MD  albuterol (PROAIR HFA) 108 (90 Base) MCG/ACT inhaler Inhale 2 puffs into the lungs every 6 (six) hours as needed for wheezing or shortness of breath. INHALE 2 PUFFS INTO THE LUNGS EVERY 6 HOURS AS NEEDED FOR WHEEZING OR SHORTNESS OF BREATH 04/19/18   Coralyn Helling, MD   amiodarone (PACERONE) 200 MG tablet Take 1 tablet (200 mg total) by mouth daily. 08/06/22   Creig Hines, NP  arformoterol (BROVANA) 15 MCG/2ML NEBU Take 2 mLs (15 mcg total) by nebulization at bedtime. 12/01/21   Coralyn Helling, MD  bisoprolol (ZEBETA) 5 MG tablet Take 0.5 tablets (2.5 mg total) by mouth daily. 08/06/22   Creig Hines, NP  budesonide (PULMICORT) 0.5 MG/2ML nebulizer solution Take 2 mLs (0.5 mg total) by nebulization in the morning and at bedtime. 11/20/21   Coralyn Helling, MD  busPIRone (BUSPAR) 15 MG tablet Take 15 mg by mouth 3 (three) times daily.    [provider]  Cholecalciferol (VITAMIN D) 50 MCG (2000 UT) tablet Take 1 tablet (2,000 Units total) by mouth daily. 06/27/18   Joaquim Nam, MD  Coenzyme Q-10 200 MG CAPS Take 200 mg by mouth daily.    [provider]  Cyanocobalamin (B-12) 5000 MCG CAPS Take 5,000 mcg by mouth daily.    [provider]  doxycycline (VIBRAMYCIN) 100 MG capsule Take 1 capsule (100 mg total) by mouth 2 (two) times daily. 08/09/22   White, Elita Boone, NP  esomeprazole (NEXIUM) 40 MG capsule Take 1 capsule (40 mg total) by mouth daily at 12 noon. 08/17/21   Unk Lightning, PA  ezetimibe (ZETIA) 10 MG tablet TAKE 1 TABLET BY MOUTH EVERYDAY AT BEDTIME 03/18/22   Joaquim Nam, MD  ferrous sulfate 325 (65 FE) MG tablet Take 1 tablet (325 mg total) by mouth daily. 09/15/22   Briant Cedar, PA-C  FLUoxetine (PROZAC) 20 MG tablet Take 20 mg by mouth 2 (two) times daily.    [provider]  glucose blood (ONETOUCH VERIO) test strip USE AS INSTRUCTED TO CHECK SUGAR 1 TIME DAILY 03/16/22   Carlus Pavlov, MD  guaiFENesin (MUCINEX) 600 MG 12 hr tablet Take 2 tablets (1,200 mg total) by mouth 2 (two) times daily as needed for cough or to loosen phlegm. 08/25/21   Coralyn Helling, MD  isosorbide mononitrate (IMDUR) 30 MG 24 hr tablet Take 30 mg by mouth daily. 08/07/22   [provider]   levothyroxine (SYNTHROID) 125 MCG tablet Take 1 tablet (125 mcg total) by mouth daily. 09/14/22   Gherghe,  Silvestre Mesi, MD  magnesium 30 MG tablet Take 1 tablet (30 mg total) by mouth 2 (two) times daily. 08/23/22   Antonieta Iba, MD  melatonin 5 MG TABS Take 1 tablet (5 mg total) by mouth at bedtime. 07/07/22 07/07/23  Gillis Santa, MD  metFORMIN (GLUCOPHAGE) 500 MG tablet Take 1 tablet (500 mg total) by mouth every evening. 09/14/22   Carlus Pavlov, MD  nitroGLYCERIN (NITROSTAT) 0.4 MG SL tablet Place 1 tablet (0.4 mg total) under the tongue every 5 (five) minutes as needed for chest pain. 07/16/21   Joaquim Nam, MD  nystatin (MYCOSTATIN) 100000 UNIT/ML suspension Take 5 mLs (500,000 Units total) by mouth 4 (four) times daily. 05/11/22   Joaquim Nam, MD  OneTouch Delica Lancets 33G MISC Use 1x a day 03/16/22   Carlus Pavlov, MD  OXYGEN Inhale into the lungs. 2lpm    [provider]  potassium chloride (KLOR-CON) 10 MEQ tablet Take 1 tablet (10 mEq total) by mouth daily. 08/16/22 11/14/22  Antonieta Iba, MD  rosuvastatin (CRESTOR) 10 MG tablet TAKE 1 TABLET BY MOUTH EVERY OTHER DAY 07/30/22   Joaquim Nam, MD  Spacer/Aero Chamber Mouthpiece MISC Use with  inhaler as needed.  J44.9 09/30/17   Joaquim Nam, MD  torsemide (DEMADEX) 20 MG tablet Take 2 tablets (40 mg total) by mouth daily. Take an additional 20 mg daily as needed for weight greater then 230 pounds 08/24/22 11/22/22  Antonieta Iba, MD  Zinc Oxide (TRIPLE PASTE) 12.8 % ointment Apply 1 Application topically as needed for irritation. Every shift.    [provider]    Family History Family History  Problem Relation Age of Onset   Heart disease Mother    Thyroid disease Mother    Emphysema Father        One lung   Cystic fibrosis Sister    Hyperthyroidism Sister    Osteoarthritis Brother    Hyperthyroidism Brother    Esophageal cancer Neg Hx    Cancer Neg Hx        Head or neck   Colon cancer  Neg Hx    Stomach cancer Neg Hx    Breast cancer Neg Hx     Social History Social History   Tobacco Use   Smoking status: Former    Current packs/day: 0.00    Average packs/day: 2.0 packs/day for 50.0 years (100.0 ttl pk-yrs)    Types: Cigarettes    Start date: 02/06/1955    Quit date: 02/05/2005    Years since quitting: 17.6   Smokeless tobacco: Never  Vaping Use   Vaping status: Never Used  Substance Use Topics   Alcohol use: Yes    Comment: occasionally   Drug use: Never     Allergies   Antihistamines, diphenhydramine-type; Incruse ellipta [umeclidinium bromide]; Clarithromycin; Codeine; Diphenhydramine; Pregabalin; Sulfonamide derivatives; Tiotropium bromide monohydrate; Umeclidinium; and Vraylar [cariprazine]   Review of Systems Review of Systems  Constitutional:  Negative for chills and fever.  Gastrointestinal:  Negative for abdominal pain, nausea and vomiting.  Genitourinary:  Positive for dysuria, frequency and urgency. Negative for flank pain.     Physical Exam Triage Vital Signs ED Triage Vitals [09/23/22 1441]  Encounter Vitals Group     BP 117/70     Systolic BP Percentile      Diastolic BP Percentile      Pulse Rate 62     Resp 18     Temp 97.7 F (36.5  C)     Temp Source Temporal     SpO2 94 %     Weight      Height      Head Circumference      Peak Flow      Pain Score      Pain Loc      Pain Education      Exclude from Growth Chart    No data found.  Updated Vital Signs BP 117/70 (BP Location: Left Arm)   Pulse 62   Temp 97.7 F (36.5 C) (Temporal)   Resp 18   LMP  (LMP Unknown)   SpO2 94%   Visual Acuity Right Eye Distance:   Left Eye Distance:   Bilateral Distance:    Right Eye Near:   Left Eye Near:    Bilateral Near:     Physical Exam Vitals and nursing note reviewed.  Constitutional:      General: She is not in acute distress.    Appearance: She is well-developed.  HENT:     Mouth/Throat:     Mouth: Mucous  membranes are moist.  Cardiovascular:     Rate and Rhythm: Normal rate and regular rhythm.     Heart sounds: Normal heart sounds.  Pulmonary:     Effort: Pulmonary effort is normal. No respiratory distress.     Breath sounds: Normal breath sounds.  Abdominal:     General: Bowel sounds are normal.     Palpations: Abdomen is soft.     Tenderness: There is no abdominal tenderness. There is no right CVA tenderness, left CVA tenderness, guarding or rebound.  Musculoskeletal:     Cervical back: Neck supple.  Skin:    General: Skin is warm and dry.  Neurological:     Mental Status: She is alert.  Psychiatric:        Mood and Affect: Mood normal.        Behavior: Behavior normal.      UC Treatments / Results  Labs (all labs ordered are listed, but only abnormal results are displayed) Labs Reviewed  POCT URINALYSIS DIP (MANUAL ENTRY) - Abnormal; Notable for the following components:      Result Value   Leukocytes, UA Trace (*)    All other components within normal limits  URINE CULTURE    EKG   Radiology No results found.  Procedures Procedures (including critical care time)  Medications Ordered in UC Medications - No data to display  Initial Impression / Assessment and Plan / UC Course  I have reviewed the triage vital signs and the nursing notes.  Pertinent labs & imaging results that were available during my care of the patient were reviewed by me and considered in my medical decision making (see chart for details).    Dysuria, urinary frequency.  Treating with Keflex. Urine culture pending. Discussed with patient that we will call her if the urine culture shows the need to change or discontinue the antibiotic. Instructed her to follow-up with her PCP if her symptoms are not improving. Patient agrees to plan of care.     Final Clinical Impressions(s) / UC Diagnoses   Final diagnoses:  Urinary frequency  Dysuria     Discharge Instructions      Take the  antibiotic as directed.  The urine culture is pending.  We will call you if it shows the need to change or discontinue your antibiotic.    Follow up with your primary care provider.  ED Prescriptions     Medication Sig Dispense Auth. Provider   cephALEXin (KEFLEX) 500 MG capsule Take 1 capsule (500 mg total) by mouth 2 (two) times daily for 5 days. 10 capsule Mickie Bail, NP      PDMP not reviewed this encounter.   Mickie Bail, NP 09/23/22 [phone]

## 2022-09-23 NOTE — ED Triage Notes (Signed)
Patient presents to [facility] for UTI. Reports dysuria, urgency, frequency x 2 days. No OTC meds.

## 2022-09-23 NOTE — Discharge Instructions (Signed)
Take the antibiotic as directed.  The urine culture is pending.  We will call you if it shows the need to change or discontinue your antibiotic.    Follow up with your primary care provider.    

## 2022-09-24 ENCOUNTER — Encounter: Payer: Self-pay | Admitting: Radiation Oncology

## 2022-09-25 LAB — URINE CULTURE: Culture: 70000 — AB

## 2022-09-26 NOTE — Progress Notes (Signed)
Radiation Oncology         [phone]) [phone] ________________________________  Outpatient Follow Up - Conducted via telephone at patient request.  I spoke with the patient to conduct this  visit via telephone. The patient was notified in advance and was offered an in person or telemedicine meeting to allow for face to face communication but instead preferred to proceed with a telephone visit.   Name: Alexandria [name]        MRN: [id number]  Date of Service: 09/27/2022 DOB: 1935/08/14  NW:[id number], Dwana Curd, MD  Josephine Igo, DO     REFERRING PHYSICIAN: Josephine Igo, DO   DIAGNOSIS: The encounter diagnosis was Malignant neoplasm of right upper lobe of lung (HCC).   HISTORY OF PRESENT ILLNESS: Alexandria [name] is a 87 y.o. female with a history of putative Stage IA3, cT1cN0M0, NSCLC of the RUL. The patient has a history of Oxygen Dependant COPD at 2-3 L [place].  She had been followed as well with nodules in both lungs since about 2017. CT imaging of the chest on 06/13/19 revealed an increase in size in a dominant lesion in the apical RUL. This measured 2.2 x 1.2 cm, she had other stable nodules. PET imaging on 07/18/19 did reveal increased metabolic activity in the RUL lesion with an SUV of 2.2 despite blood pool of 3.32. She was counseled on the option of bronchoscopy for diagnosis of the presumed early stage lung cancer but was not interested in having any invasive procedures, or surgery. As a result, she proceeded with stereotactic body radiotherapy (SBRT) which she completed in August 2021.   The patient is continued to be without residual disease. She's had scans that have shown  evolving changes in the postradiation fibrosis of the apex of the right upper lobe. Persistent atherosclerotic disease as well as calcifications of the aortic valve and mitral annulus continue to be noted. She also has chronic changes in the liver consistent with cavernous hemangioma as well as stable changes in the lateral  aspect of the left seventh rib which radiology feels likely represents a bone island. She also has a T5 compression fracture that was noted in January 2024 by CT.   Her most recent CT chest on 09/17/22 showed stable post radiation changes including scarring at the right lung apex measuring 3.8 cm in greatest dimension, stable in comparison to her prior in January 2024. Otherwise all findings were stable as noted above, and the 2 tiny new nodules measuring 2 mm in greatest dimension in the right upper lobe, have resolved. She's contacted by phone to review these results. Of note her Creatinine was 1.5 and she has a diagnosis of chronic kidney disease. She has been working with hematology and receiving iron infusions, and is contemplating the use of epogen as well.    PREVIOUS RADIATION THERAPY:    10/11/19 - 10/18/19 SBRT: The patient was treated to the right upper lobe lung with a course of stereotactic body radiation treatment.  The patient received 54 Gray in 3 fractions using a IMRT/SBRT technique, with 3 fields.  PAST MEDICAL HISTORY:  Past Medical History:  Diagnosis Date   Allergy, unspecified not elsewhere classified    Anxiety    Aortic stenosis, mild 03/30/2022   a. TTE 03/29/2022: EF 55-60, no RWMA, GR 1 DD, normal RVSF, mild aortic stenosis (mean 10, V-max 212 cm/s, DI 0.70)   Cataract    Chronic diastolic congestive heart failure (HCC)    a. 06/2022  Echo: EF 60-65%, no rwma, nl RV fxn, mild MR, mild AS.   COPD (chronic obstructive pulmonary disease) (HCC) 03/08/1998   PFTs 12/12/2002 FEV 1 1.42 (64%) ratio 58 with no better after B2 and DLCO75%; PFTs 11/20/09 FEV1 1.50 (73%) ratio 50 no better after B2 with DLCO 62%; Hfa 50% 11/20/2009 >75%, 01/13/10 p coaching   Coronary artery calcification seen on CT scan    a. 2012 Neg stress test.   Depression 12/07/1998   Diabetes mellitus type II 02/05/2006   Dr. Elvera Lennox with endo   Disorders of bursae and tendons in shoulder region, unspecified     Rotator cuff syndrome, right   E. coli bacteremia    Esophagitis    GERD (gastroesophageal reflux disease)    History of UTI    Hyperlipidemia 10/04/2000   Hypertension 03/08/1992   Hypothyroidism 03/08/1968   Iron deficiency anemia    Lung nodule    radiation starts 10-08-2019   Malignant neoplasm of right upper lobe of lung (HCC) 07/24/2019   Obesity    NOS   OSA (obstructive sleep apnea)    PSG 01/27/10 AHI 13, pt does not know CPAP settings   PAF (paroxysmal atrial fibrillation) (HCC)    a. 06/2022 in setting of UTI/encephalopathy-->converted on amio; b. CHA2DS2VASc = 6-->No OAC 2/2 chronic anemia/fall risk; c. 06/2022 Zio: Predominantly sinus bradycardia at 54 (3-158)'s.  1 brief run of SVT.  2.2% PVC burden.   Peripheral neuropathy    Likely due to DM per Dr. Tresa Endo hernia        PAST SURGICAL HISTORY: Past Surgical History:  Procedure Laterality Date   ABD U/S  03/19/1999   Nml x2 foci in liver   ADENOSINE MYOVIEW  06/02/2007   Nml   CARDIOLITE PERSANTINE  08/24/2000   Nml   CAROTID U/S  08/24/2000   1-39% ICA stenosis   CAROTID U/S  06/02/2007   No apprec change    CARPAL TUNNEL RELEASE  12/1997   Right   CESAREAN SECTION     x2 [place]/ repeat   CHOLECYSTECTOMY  1997   COLONOSCOPY WITH PROPOFOL N/A 02/12/2016   Procedure: COLONOSCOPY WITH PROPOFOL;  Surgeon: Rachael Fee, MD;  Location: [facility];  Service: Endoscopy;  Laterality: N/A;   CT ABD W & PELVIS WO/W CM     Abd hemangiomas of liver, 1 cm R renal cyst   DENTAL SURGERY  2016   Implants   DEXA  07/03/2003   Nml   ESOPHAGOGASTRODUODENOSCOPY  12/05/1997   Nml (due to hoarseness)   ESOPHAGOGASTRODUODENOSCOPY (EGD) WITH PROPOFOL N/A 02/12/2016   Procedure: ESOPHAGOGASTRODUODENOSCOPY (EGD) WITH PROPOFOL;  Surgeon: Rachael Fee, MD;  Location: [facility];  Service: Endoscopy;  Laterality: N/A;   GALLBLADDER SURGERY     HERNIA REPAIR  01/24/2009   Lap Ventr w/ Lysis of adhesions (Dr.  Dwain Sarna)   knee arthroscopic surgery  years ago   right   ROTATOR CUFF REPAIR  1984   Right, [place]   SHOULDER OPEN ROTATOR CUFF REPAIR  02/08/2012   Procedure: ROTATOR CUFF REPAIR SHOULDER OPEN;  Surgeon: Drucilla Schmidt, MD;  Location: [facility];  Service: Orthopedics;  Laterality: Left;  Left Shoulder Open Anterior Acrominectomy Rotator Cuff Repair Open Distal Clavicle Resection ,tissue mend graft, and repair of biceps tendon   THUMB RELEASE  12/1997   Right   TONSILLECTOMY     TOTAL ABDOMINAL HYSTERECTOMY  1985   Due to dysmennorhea   US  ECHOCARDIOGRAPHY  06/02/2007     FAMILY HISTORY:  Family History  Problem Relation Age of Onset   Heart disease Mother    Thyroid disease Mother    Emphysema Father        One lung   Cystic fibrosis Sister    Hyperthyroidism Sister    Osteoarthritis Brother    Hyperthyroidism Brother    Esophageal cancer Neg Hx    Cancer Neg Hx        Head or neck   Colon cancer Neg Hx    Stomach cancer Neg Hx    Breast cancer Neg Hx      SOCIAL HISTORY:  reports that she quit smoking about 17 years ago. Her smoking use included cigarettes. She started smoking about 67 years ago. She has a 100 pack-year smoking history. She has never used smokeless tobacco. She reports current alcohol use. She reports that she does not use drugs. The patient is widowed and lives in [place]. She is often helped by her daughter [name] who lives in [place].   ALLERGIES: Antihistamines, diphenhydramine-type; Incruse ellipta [umeclidinium bromide]; Clarithromycin; Codeine; Diphenhydramine; Pregabalin; Sulfonamide derivatives; Tiotropium bromide monohydrate; Umeclidinium; and Vraylar [cariprazine]   MEDICATIONS:  Current Outpatient Medications  Medication Sig Dispense Refill   acetaminophen (TYLENOL) 325 MG tablet Take 2 tablets (650 mg total) by mouth every 6 (six) hours as needed for mild pain, headache, fever or moderate pain.     albuterol (PROAIR HFA) 108 (90  Base) MCG/ACT inhaler Inhale 2 puffs into the lungs every 6 (six) hours as needed for wheezing or shortness of breath. INHALE 2 PUFFS INTO THE LUNGS EVERY 6 HOURS AS NEEDED FOR WHEEZING OR SHORTNESS OF BREATH 3 Inhaler 3   amiodarone (PACERONE) 200 MG tablet Take 1 tablet (200 mg total) by mouth daily. 90 tablet 1   arformoterol (BROVANA) 15 MCG/2ML NEBU Take 2 mLs (15 mcg total) by nebulization at bedtime. 120 mL 6   bisoprolol (ZEBETA) 5 MG tablet Take 0.5 tablets (2.5 mg total) by mouth daily. 45 tablet 1   budesonide (PULMICORT) 0.5 MG/2ML nebulizer solution Take 2 mLs (0.5 mg total) by nebulization in the morning and at bedtime. 360 mL 3   busPIRone (BUSPAR) 15 MG tablet Take 15 mg by mouth 3 (three) times daily.     cephALEXin (KEFLEX) 500 MG capsule Take 1 capsule (500 mg total) by mouth 2 (two) times daily for 5 days. 10 capsule 0   Cholecalciferol (VITAMIN D) 50 MCG (2000 UT) tablet Take 1 tablet (2,000 Units total) by mouth daily.     Coenzyme Q-10 200 MG CAPS Take 200 mg by mouth daily.     Cyanocobalamin (B-12) 5000 MCG CAPS Take 5,000 mcg by mouth daily.     doxycycline (VIBRAMYCIN) 100 MG capsule Take 1 capsule (100 mg total) by mouth 2 (two) times daily. 14 capsule 0   esomeprazole (NEXIUM) 40 MG capsule Take 1 capsule (40 mg total) by mouth daily at 12 noon. 30 capsule 11   ezetimibe (ZETIA) 10 MG tablet TAKE 1 TABLET BY MOUTH EVERYDAY AT BEDTIME 90 tablet 2   ferrous sulfate 325 (65 FE) MG tablet Take 1 tablet (325 mg total) by mouth daily. 90 tablet 3   FLUoxetine (PROZAC) 20 MG tablet Take 20 mg by mouth 2 (two) times daily.     glucose blood (ONETOUCH VERIO) test strip USE AS INSTRUCTED TO CHECK SUGAR 1 TIME DAILY 100 strip 3   guaiFENesin (MUCINEX) 600 MG 12  hr tablet Take 2 tablets (1,200 mg total) by mouth 2 (two) times daily as needed for cough or to loosen phlegm.     isosorbide mononitrate (IMDUR) 30 MG 24 hr tablet Take 30 mg by mouth daily.     levothyroxine (SYNTHROID)  125 MCG tablet Take 1 tablet (125 mcg total) by mouth daily. 90 tablet 3   magnesium 30 MG tablet Take 1 tablet (30 mg total) by mouth 2 (two) times daily. 180 tablet 3   melatonin 5 MG TABS Take 1 tablet (5 mg total) by mouth at bedtime.  0   metFORMIN (GLUCOPHAGE) 500 MG tablet Take 1 tablet (500 mg total) by mouth every evening. 90 tablet 3   nitroGLYCERIN (NITROSTAT) 0.4 MG SL tablet Place 1 tablet (0.4 mg total) under the tongue every 5 (five) minutes as needed for chest pain. 25 tablet 3   nystatin (MYCOSTATIN) 100000 UNIT/ML suspension Take 5 mLs (500,000 Units total) by mouth 4 (four) times daily. 120 mL 0   OneTouch Delica Lancets 33G MISC Use 1x a day 100 each 3   OXYGEN Inhale into the lungs. 2lpm     potassium chloride (KLOR-CON) 10 MEQ tablet Take 1 tablet (10 mEq total) by mouth daily. 90 tablet 3   rosuvastatin (CRESTOR) 10 MG tablet TAKE 1 TABLET BY MOUTH EVERY OTHER DAY 45 tablet 2   Spacer/Aero Chamber Mouthpiece MISC Use with  inhaler as needed.  J44.9 1 each 0   torsemide (DEMADEX) 20 MG tablet Take 2 tablets (40 mg total) by mouth daily. Take an additional 20 mg daily as needed for weight greater then 230 pounds 270 tablet 1   Zinc Oxide (TRIPLE PASTE) 12.8 % ointment Apply 1 Application topically as needed for irritation. Every shift.     No current facility-administered medications for this visit.     REVIEW OF SYSTEMS: On review of systems, the patient reports that she is doing okay. She continues with O2 between 2 1/2-3 L [place]. Her daughter states they are going to work on elevating lower extremities, minimizing salt intake, and wearing lower extremity compression stockings. She reports she is having some pain that is shooting/burning in quality, but not daily. Her symptoms seem to come and go but are located in the T5 dermatome, a bit below the nipple line but above the bra strap region. No weakness or other complaints are verbalized.   PHYSICAL EXAM:  Unable to assess  due to encounter type.   ECOG = 1  0 - Asymptomatic (Fully active, able to carry on all predisease activities without restriction)  1 - Symptomatic but completely ambulatory (Restricted in physically strenuous activity but ambulatory and able to carry out work of a light or sedentary nature. For example, light housework, office work)  2 - Symptomatic, <50% in bed during the day (Ambulatory and capable of all self care but unable to carry out any work activities. Up and about more than 50% of waking hours)  3 - Symptomatic, >50% in bed, but not bedbound (Capable of only limited self-care, confined to bed or chair 50% or more of waking hours)  4 - Bedbound (Completely disabled. Cannot carry on any self-care. Totally confined to bed or chair)  5 - Death   Santiago Glad MM, Creech RH, Tormey DC, et al. [phone]). "Toxicity and response criteria of the [facility]". Am. Evlyn Clines. Oncol. 5 (6): 649-55       IMPRESSION/PLAN: 1. Putative Stage IA3, cT1cN0M0, NSCLC of the  RUL. Her imaging findings in the RUL are stable in the area of her prior therapy. We will follow up with another CT scan of the chest without contrast given her creatinine levels prior to her procedure. She is in agreement.  2. RUL nodules. These have resolved, and we will follow up with subsequent CT scans expectantly. 3. Right posterior 3rd rib fracture and left seventh rib bone island. These will be followed, the patient's rib fracture was not commented on in the radiology report. She is asymptomatic but we will follow this on subsequent scans.  4 T5 vertebral compression fracture. The patient does have symptoms of pain intermittently. We previously discussed referral to interventional radiology, but I would suggest neurosurgery if needed given her other comorbidities to discuss kyphoplasty. At this time the patient would like to follow her symptoms expectantly but will discuss further with Dr. Para March or call us back  to review. 5. O2 dependent COPD. The patient will continue with Dr. Craige Cotta and his team. We will follow this expectantly.  6. Iron deficiency anemia likely anemia of chronic kidney disease.Marland Kitchen She will follow up with Dr. Donneta Romberg for further management. Given her kidney disease we will continue with CT scans without contrast. 7. Lower extremity edema. She will follow up with her cardiology team for recommendations moving forward.    This encounter was conducted via telephone.  The patient has provided two factor identification and has given verbal consent for this type of encounter and has been advised to only accept a meeting of this type in a secure network environment. The time spent during this encounter was 45 minutes including preparation, discussion, and coordination of the patient's care.  The attendants for this meeting includeAlison Elvis Coil  and DAIANNA VASQUES and her daughter Garrison Columbus. During the encounter,  Ronny Bacon was located remotely.  TIFFANE SHELDON was located at home, her daughter Garrison Columbus was located at her own home.     Osker Mason, PAC

## 2022-09-27 ENCOUNTER — Telehealth: Payer: Self-pay | Admitting: Cardiovascular Disease

## 2022-09-27 ENCOUNTER — Ambulatory Visit
Admission: RE | Admit: 2022-09-27 | Discharge: 2022-09-27 | Disposition: A | Discharge status: Home / Self Care | Payer: Medicare Other | Source: Ambulatory Visit | Attending: Radiation Oncology | Admitting: Radiation Oncology

## 2022-09-27 ENCOUNTER — Encounter: Payer: Self-pay | Admitting: Radiation Oncology

## 2022-09-27 DIAGNOSIS — I480 Paroxysmal atrial fibrillation: Secondary | ICD-10-CM | POA: Diagnosis not present

## 2022-09-27 DIAGNOSIS — J9622 Acute and chronic respiratory failure with hypercapnia: Secondary | ICD-10-CM | POA: Diagnosis not present

## 2022-09-27 DIAGNOSIS — G903 Multi-system degeneration of the autonomic nervous system: Secondary | ICD-10-CM | POA: Diagnosis not present

## 2022-09-27 DIAGNOSIS — C3411 Malignant neoplasm of upper lobe, right bronchus or lung: Secondary | ICD-10-CM

## 2022-09-27 DIAGNOSIS — E1142 Type 2 diabetes mellitus with diabetic polyneuropathy: Secondary | ICD-10-CM | POA: Diagnosis not present

## 2022-09-27 DIAGNOSIS — Z87891 Personal history of nicotine dependence: Secondary | ICD-10-CM | POA: Diagnosis not present

## 2022-09-27 DIAGNOSIS — I110 Hypertensive heart disease with heart failure: Secondary | ICD-10-CM | POA: Diagnosis not present

## 2022-09-27 DIAGNOSIS — I509 Heart failure, unspecified: Secondary | ICD-10-CM | POA: Diagnosis not present

## 2022-09-27 DIAGNOSIS — I10 Essential (primary) hypertension: Secondary | ICD-10-CM | POA: Diagnosis not present

## 2022-09-27 DIAGNOSIS — J9621 Acute and chronic respiratory failure with hypoxia: Secondary | ICD-10-CM | POA: Diagnosis not present

## 2022-09-27 LAB — URINE CULTURE

## 2022-09-27 NOTE — Progress Notes (Signed)
Telephone nursing appointment for patient to review most recent scan results form 09/23/2022. I verified patient's identity x2 and began nursing interview. Patient reports hot flashed and bilateral leg muscle weakness. No other issues conveyed at this time.   Meaningful use complete.   Patient aware of their 3:30pm-09/27/2022 telephone appointment w/ Laurence Aly PA-C. I left my extension [phone] in case patient needs anything. Patient verbalized understanding. This concludes the nursing interview.   Patient contact [phone]- Daughter Garrison Columbus     Ruel Favors, LPN

## 2022-09-27 NOTE — Telephone Encounter (Signed)
Calling to get a verbal that it okay for her to take patient blood work. Its hard for the patient to get out the house. Please advise

## 2022-09-29 ENCOUNTER — Telehealth: Payer: Self-pay | Admitting: Family Medicine

## 2022-09-29 ENCOUNTER — Encounter: Payer: Self-pay | Admitting: Family Medicine

## 2022-09-29 DIAGNOSIS — G903 Multi-system degeneration of the autonomic nervous system: Secondary | ICD-10-CM | POA: Diagnosis not present

## 2022-09-29 DIAGNOSIS — J9621 Acute and chronic respiratory failure with hypoxia: Secondary | ICD-10-CM | POA: Diagnosis not present

## 2022-09-29 DIAGNOSIS — E1142 Type 2 diabetes mellitus with diabetic polyneuropathy: Secondary | ICD-10-CM | POA: Diagnosis not present

## 2022-09-29 DIAGNOSIS — I110 Hypertensive heart disease with heart failure: Secondary | ICD-10-CM | POA: Diagnosis not present

## 2022-09-29 DIAGNOSIS — I480 Paroxysmal atrial fibrillation: Secondary | ICD-10-CM | POA: Diagnosis not present

## 2022-09-29 DIAGNOSIS — J9622 Acute and chronic respiratory failure with hypercapnia: Secondary | ICD-10-CM | POA: Diagnosis not present

## 2022-09-29 NOTE — Telephone Encounter (Signed)
Called and spoke with daughter [name]. Informed her of the following recommendations from Dr. Mariah Milling.   Creatinine 1.8 and elevated BUN consistent with prerenal state/dehydration Would recommend she hold torsemide for 2 days Suggest we change her weight scale for extra torsemide Would take torsemide 40 daily with potassium 20 for weight less than 235 pounds Would only take extra torsemide for total of 60 mg daily for weight over 235 pounds Higher weight possibly from calorie intake/food weight Thx TG        Daughter verbalized understanding.

## 2022-09-29 NOTE — Telephone Encounter (Signed)
Mychart message was also sent about this too

## 2022-09-29 NOTE — Telephone Encounter (Signed)
Called and spoke with patients daughter, [name]. Reviewed Dr. Lianne Bushy message with her, she states she can tell the patient has improved however pts daughter stated that the patient does not recognize some of the symptoms as well as she used to due to her age and cognitive health and this is why she is requesting a repeat test to ensure all has resolved with the UTI. She states the patient was admitted to the hospital previously for a UTI and she wants to ensure that does not happen again.  Patients daughter requested a response either from Dr. Para March or CMA be sent through MyChart rather than calling.

## 2022-09-29 NOTE — Telephone Encounter (Signed)
Aimee from [facility] called in and stated that patient went to urgent care for UTI and was prescribed an antibiotic. She stated that she stopped the antibiotic yesterday and wants to know if Dr. Para March would like a repeat UA and culture done sometime next week if he finds that appropriate. Please advise.Thank you!

## 2022-09-29 NOTE — Telephone Encounter (Signed)
Called and spoke with Amy from home health and daughter [name] per [place]. Home health had patient's BMET and magnesium drawn at [facility]. [facility]. Daughter reports that patient 's baseline weight is 230 lbs. The patient takes 40 mg of Torsemide daily but when weight is above 230 lb she takes an extra 20 mg. The patient's weight has been above 230 since 09/16/22  the patient has been taking 60 mg of Torsemide since then. The most recent weights are:  09/26/22 235 lb 09/27/22 234.5 lb 09/28/22 234.5 lb 09/29/22 232.5 lb  Daughter and Home Health asking for further recommendations.

## 2022-09-29 NOTE — Telephone Encounter (Signed)
Did her symptoms completely resolve?  That is the first issue that needs to be clearly defined.  We cannot do anything until I know about that.  If her symptoms have completely resolved, we can recheck a urine culture later on but if she does not have any symptoms, then it would not make sense to treat her as she is likely colonized with bacteria at that point.  I get that she can have trouble detecting typical UTI symptoms but in the absence of urinary symptoms or any other status changes it would not make sense to retreat her with antibiotics if she is at her previous baseline.  If she did have any UTI symptoms or change in baseline, we would reculture her urine anyway.  And either way it would not make sense to recheck her urine sooner than 1 week after completion of antibiotics as the previous antibiotic use would likely invalidate any short-term culture.  So please let me know if her symptoms have completely resolved so we can make some plans.  Thanks.

## 2022-09-29 NOTE — Telephone Encounter (Signed)
Did she improve and have resolution of the symptoms in the meantime?  Let me know.    We don't usually repeat the culture after the treatment if patient improves.  If her symptoms resolve, then rechecking the lab wouldn't change the plan.  You usually don't treat a positive follow up culture in the absence of symptoms.

## 2022-09-29 NOTE — Telephone Encounter (Signed)
Calling to f/u on the "okay" to do pt's blood work as well as discuss pt's weight. Please advise

## 2022-09-30 ENCOUNTER — Encounter: Payer: Self-pay | Admitting: Cardiovascular Disease

## 2022-09-30 ENCOUNTER — Telehealth: Payer: Self-pay | Admitting: Internal Medicine

## 2022-09-30 ENCOUNTER — Other Ambulatory Visit: Payer: Self-pay | Admitting: Nurse Practitioner

## 2022-09-30 DIAGNOSIS — I25118 Atherosclerotic heart disease of native coronary artery with other forms of angina pectoris: Secondary | ICD-10-CM

## 2022-09-30 DIAGNOSIS — I5032 Chronic diastolic (congestive) heart failure: Secondary | ICD-10-CM

## 2022-09-30 NOTE — Telephone Encounter (Signed)
Message has been sent thru mychart as requested by patients daughter. After speaking with Alexandria [name] about this incident that went on yesterday with patients daughter; this is the best course of action as she was nasty with the CMA helping out yesterday.

## 2022-09-30 NOTE — Telephone Encounter (Signed)
Called to r/s patient due to needing premeds- rescheduled appointment did not work for Daughter [name]- she states her mother has an appointment on Tuesday (when I changed to) and that we needed to get her back on the Monday appointment. I spoke to Ally-infusion and decision made to change to Monday morning for Lab/md/iron. I spoke to [place] and she said her mother is 85 years old and on oxygen and in a wheelchair and can not come before 1pm. Again I spoke with Connye Burkitt. There are no chairs or md appointments to add her back to Monday afternoon to make the premed time work. Ally and I spoke to [employer]. She suggested we change it back to Monday and move another patient's appointment to accommodate Ms.Ewell Poe. In order for this to be I changed the appointment from MD to APP and have her scheduled. Patients daughter notified via mychart since she is getting on a plane to go out of town.   [name] was very argumentative and insisted her appointments be changed back. I offered to let my supervisor speak to her and after another round of her telling me why the appointments didn't work she finally said ok I need to speak to your supervisor. Ally and I have updated appointments and I think she should be good. Just wanted [place] all to be on the same page.

## 2022-10-01 ENCOUNTER — Other Ambulatory Visit: Payer: Self-pay | Admitting: Family Medicine

## 2022-10-01 ENCOUNTER — Other Ambulatory Visit: Payer: Self-pay

## 2022-10-01 DIAGNOSIS — D649 Anemia, unspecified: Secondary | ICD-10-CM

## 2022-10-01 MED ORDER — CEPHALEXIN 500 MG PO CAPS: 500.0000 mg | ORAL_CAPSULE | Freq: Four times a day (QID) | ORAL | 0 refills | Status: DC

## 2022-10-03 ENCOUNTER — Encounter: Payer: Self-pay | Admitting: Cardiovascular Disease

## 2022-10-04 ENCOUNTER — Encounter: Payer: Self-pay | Admitting: Cardiovascular Disease

## 2022-10-04 ENCOUNTER — Inpatient Hospital Stay: Payer: Medicare Other

## 2022-10-04 ENCOUNTER — Other Ambulatory Visit: Payer: Self-pay | Admitting: *Deleted

## 2022-10-04 ENCOUNTER — Ambulatory Visit: Payer: PRIVATE HEALTH INSURANCE | Admitting: Internal Medicine

## 2022-10-04 ENCOUNTER — Encounter: Payer: Self-pay | Admitting: Nurse Practitioner

## 2022-10-04 ENCOUNTER — Ambulatory Visit: Payer: PRIVATE HEALTH INSURANCE

## 2022-10-04 ENCOUNTER — Ambulatory Visit: Payer: Medicare Other | Admitting: Radiation Oncology

## 2022-10-04 ENCOUNTER — Other Ambulatory Visit: Payer: PRIVATE HEALTH INSURANCE

## 2022-10-04 ENCOUNTER — Inpatient Hospital Stay (HOSPITAL_BASED_OUTPATIENT_CLINIC_OR_DEPARTMENT_OTHER): Payer: Medicare Other | Admitting: Nurse Practitioner

## 2022-10-04 VITALS — BP 121/44 | HR 61 | Temp 98.0°F | Resp 18

## 2022-10-04 VITALS — BP 125/43 | HR 55 | Temp 97.4°F | Wt 233.0 lb

## 2022-10-04 DIAGNOSIS — Z85118 Personal history of other malignant neoplasm of bronchus and lung: Secondary | ICD-10-CM | POA: Diagnosis not present

## 2022-10-04 DIAGNOSIS — Z7189 Other specified counseling: Secondary | ICD-10-CM | POA: Diagnosis not present

## 2022-10-04 DIAGNOSIS — N1832 Chronic kidney disease, stage 3b: Secondary | ICD-10-CM | POA: Diagnosis not present

## 2022-10-04 DIAGNOSIS — J449 Chronic obstructive pulmonary disease, unspecified: Secondary | ICD-10-CM | POA: Diagnosis not present

## 2022-10-04 DIAGNOSIS — D631 Anemia in chronic kidney disease: Secondary | ICD-10-CM

## 2022-10-04 DIAGNOSIS — D649 Anemia, unspecified: Secondary | ICD-10-CM

## 2022-10-04 DIAGNOSIS — D509 Iron deficiency anemia, unspecified: Secondary | ICD-10-CM | POA: Diagnosis not present

## 2022-10-04 DIAGNOSIS — D500 Iron deficiency anemia secondary to blood loss (chronic): Secondary | ICD-10-CM | POA: Diagnosis not present

## 2022-10-04 DIAGNOSIS — I130 Hypertensive heart and chronic kidney disease with heart failure and stage 1 through stage 4 chronic kidney disease, or unspecified chronic kidney disease: Secondary | ICD-10-CM | POA: Diagnosis not present

## 2022-10-04 LAB — CBC WITH DIFFERENTIAL (CANCER CENTER ONLY)
Abs Immature Granulocytes: 0.05 10*3/uL (ref 0.00–0.07)
Basophils Absolute: 0 10*3/uL (ref 0.0–0.1)
Basophils Relative: 0 %
Eosinophils Absolute: 0.1 10*3/uL (ref 0.0–0.5)
Eosinophils Relative: 2 %
HCT: 24.5 % — ABNORMAL LOW (ref 36.0–46.0)
Hemoglobin: 7.2 g/dL — ABNORMAL LOW (ref 12.0–15.0)
Immature Granulocytes: 1 %
Lymphocytes Relative: 14 %
Lymphs Abs: 0.9 10*3/uL (ref 0.7–4.0)
MCH: 30.6 pg (ref 26.0–34.0)
MCHC: 29.4 g/dL — ABNORMAL LOW (ref 30.0–36.0)
MCV: 104.3 fL — ABNORMAL HIGH (ref 80.0–100.0)
Monocytes Absolute: 0.6 10*3/uL (ref 0.1–1.0)
Monocytes Relative: 9 %
Neutro Abs: 4.8 10*3/uL (ref 1.7–7.7)
Neutrophils Relative %: 74 %
Platelet Count: 162 10*3/uL (ref 150–400)
RBC: 2.35 MIL/uL — ABNORMAL LOW (ref 3.87–5.11)
RDW: 15.6 % — ABNORMAL HIGH (ref 11.5–15.5)
WBC Count: 6.5 10*3/uL (ref 4.0–10.5)
nRBC: 0 % (ref 0.0–0.2)

## 2022-10-04 LAB — SAMPLE TO BLOOD BANK

## 2022-10-04 LAB — IRON AND TIBC
Iron: 46 ug/dL (ref 28–170)
Saturation Ratios: 12 % (ref 10.4–31.8)
TIBC: 391 ug/dL (ref 250–450)
UIBC: 345 ug/dL

## 2022-10-04 LAB — FERRITIN: Ferritin: 42 ng/mL (ref 11–307)

## 2022-10-04 LAB — VITAMIN B12: Vitamin B-12: 472 pg/mL (ref 180–914)

## 2022-10-04 LAB — PREPARE RBC (CROSSMATCH)

## 2022-10-04 LAB — TYPE AND SCREEN

## 2022-10-04 MED ORDER — METHYLPREDNISOLONE NA SUC (PF) 125 MG IJ SOLR
20.0000 mg | Freq: Once | INTRAMUSCULAR | Status: AC | PRN
  Administered 2022-10-04: 20 mg via INTRAVENOUS

## 2022-10-04 MED ORDER — SODIUM CHLORIDE 0.9 % IV SOLN
Freq: Once | INTRAVENOUS | Status: AC
  Filled 2022-10-04: qty 250

## 2022-10-04 MED ORDER — SODIUM CHLORIDE 0.9 % IV SOLN
200.0000 mg | Freq: Once | INTRAVENOUS | Status: AC
  Administered 2022-10-04: 200 mg via INTRAVENOUS
  Filled 2022-10-04: qty 200

## 2022-10-04 MED ORDER — METHYLPREDNISOLONE SODIUM SUCC 40 MG IJ SOLR: 20.0000 mg | Freq: Once | INTRAMUSCULAR | Status: DC | PRN

## 2022-10-04 NOTE — Progress Notes (Unsigned)
[facility] CONSULT NOTE  Patient Care Team: Joaquim Nam, MD as PCP - General Mariah Milling, Tollie Pizza, MD as PCP - Cardiology (Cardiology) Antonieta Iba, MD as Consulting Physician (Cardiology) Oneta Rack, NP as Nurse Practitioner (Adult Health Nurse Practitioner) Earna Coder, MD as Consulting Physician (Oncology)  CHIEF COMPLAINTS/PURPOSE OF CONSULTATION: ANEMIA  HEMATOLOGY HISTORY:  # CHRONIC INTERMITTENT ANEMIA - [since 2010]- Hb SEP 2022- 8; previously on PO Iron- WBC/platelets- Normal; MCV- 90s. EGD- > 15 years; /Colonoscopy:4 years- [Dr.Jacob;; in [place]] capsule-? Bone marrow Biopsy-none; OCT 2022- SEVERE IRON DEFICIENCY; myeloma work-up/hemolysis work-up negative; JAN 2023-CT scan duodenal thickening-recommend GI evaluation  #   AUG 2021- RIGHT LUNG stage I non-small cell lung cancer-  s/p SBRT  [Dr.Moody; [place]]; NOV 2022- CT chest-no recurrent disease.  # COPD on 2.5 lit/day [Dr.Sood]; diastolic CHF/ OCT 2022- Low BP [needing to come off losartan- Dr.Gollan]; peripheral neuropathy.  HISTORY OF PRESENTING ILLNESS: Alone; 3 Lit/ O2 24 x 7. In wheel chair. Daughter, [name], on phone. Caregiver, Elease Hashimoto, in lobby.   Alexandria [name] 87 y.o. female with chronic respiratory failure, on oxygen, and severe iron deficiency anemia of unclear etiology, who returns to clinic for follow up and consideration of IV iron. She was seen by APP earlier this month, received venofer. Starting EPO was discussed but has been on hold. She has not required interval transfusions. Continues to deny black or bloody stools. Today, she reports increased shortness of breath. At time she has had some chest pressure. She has taken nitroglycerin twice at home over past couple of days. Symptoms did improve. Her cardiologist recommended evaluation if she required a third dose. She saw PCP and was diagnosed with UTI. She continues cephalexin 4 times a day. She saw GI in June and declined  capsule endoscopy as she was felt to not be a candidate for procedures and has elected for palliation of her anemia.    Review of Systems  Constitutional:  Positive for malaise/fatigue. Negative for chills, fever and weight loss.  HENT:  Negative for nosebleeds.   Respiratory:  Positive for shortness of breath. Negative for cough, hemoptysis and wheezing.   Cardiovascular:  Positive for palpitations. Negative for chest pain and leg swelling.  Gastrointestinal:  Negative for abdominal pain, blood in stool, constipation, diarrhea, melena, nausea and vomiting.  Genitourinary:  Negative for dysuria, hematuria and urgency.  Musculoskeletal:  Positive for back pain and joint pain. Negative for falls, myalgias and neck pain.  Skin:  Negative for itching and rash.  Neurological:  Positive for weakness. Negative for dizziness, tingling, sensory change, loss of consciousness and headaches.  Endo/Heme/Allergies:  Negative for environmental allergies. Does not bruise/bleed easily.  Psychiatric/Behavioral:  Negative for depression. The patient is not nervous/anxious and does not have insomnia.     MEDICAL HISTORY:  Past Medical History:  Diagnosis Date   Allergy, unspecified not elsewhere classified    Anxiety    Aortic stenosis, mild 03/30/2022   a. TTE 03/29/2022: EF 55-60, no RWMA, GR 1 DD, normal RVSF, mild aortic stenosis (mean 10, V-max 212 cm/s, DI 0.70)   Cataract    Chronic diastolic congestive heart failure (HCC)    a. 06/2022 Echo: EF 60-65%, no rwma, nl RV fxn, mild MR, mild AS.   COPD (chronic obstructive pulmonary disease) (HCC) 03/08/1998   PFTs 12/12/2002 FEV 1 1.42 (64%) ratio 58 with no better after B2 and DLCO75%; PFTs 11/20/09 FEV1 1.50 (73%) ratio 50 no better after B2  with DLCO 62%; Hfa 50% 11/20/2009 >75%, 01/13/10 p coaching   Coronary artery calcification seen on CT scan    a. 2012 Neg stress test.   Depression 12/07/1998   Diabetes mellitus type II 02/05/2006   Dr. Elvera Lennox  with endo   Disorders of bursae and tendons in shoulder region, unspecified    Rotator cuff syndrome, right   E. coli bacteremia    Esophagitis    GERD (gastroesophageal reflux disease)    History of UTI    Hyperlipidemia 10/04/2000   Hypertension 03/08/1992   Hypothyroidism 03/08/1968   Iron deficiency anemia    Lung nodule    radiation starts 10-08-2019   Malignant neoplasm of right upper lobe of lung (HCC) 07/24/2019   Obesity    NOS   OSA (obstructive sleep apnea)    PSG 01/27/10 AHI 13, pt does not know CPAP settings   PAF (paroxysmal atrial fibrillation) (HCC)    a. 06/2022 in setting of UTI/encephalopathy-->converted on amio; b. CHA2DS2VASc = 6-->No OAC 2/2 chronic anemia/fall risk; c. 06/2022 Zio: Predominantly sinus bradycardia at 54 (3-158)'s.  1 brief run of SVT.  2.2% PVC burden.   Peripheral neuropathy    Likely due to DM per Dr. Tresa Endo hernia     SURGICAL HISTORY: Past Surgical History:  Procedure Laterality Date   ABD U/S  03/19/1999   Nml x2 foci in liver   ADENOSINE MYOVIEW  06/02/2007   Nml   CARDIOLITE PERSANTINE  08/24/2000   Nml   CAROTID U/S  08/24/2000   1-39% ICA stenosis   CAROTID U/S  06/02/2007   No apprec change    CARPAL TUNNEL RELEASE  12/1997   Right   CESAREAN SECTION     x2 Breech/ repeat   CHOLECYSTECTOMY  1997   COLONOSCOPY WITH PROPOFOL N/A 02/12/2016   Procedure: COLONOSCOPY WITH PROPOFOL;  Surgeon: Rachael Fee, MD;  Location: [facility];  Service: Endoscopy;  Laterality: N/A;   CT ABD W & PELVIS WO/W CM     Abd hemangiomas of liver, 1 cm R renal cyst   DENTAL SURGERY  2016   Implants   DEXA  07/03/2003   Nml   ESOPHAGOGASTRODUODENOSCOPY  12/05/1997   Nml (due to hoarseness)   ESOPHAGOGASTRODUODENOSCOPY (EGD) WITH PROPOFOL N/A 02/12/2016   Procedure: ESOPHAGOGASTRODUODENOSCOPY (EGD) WITH PROPOFOL;  Surgeon: Rachael Fee, MD;  Location: [facility];  Service: Endoscopy;  Laterality: N/A;   GALLBLADDER SURGERY      HERNIA REPAIR  01/24/2009   Lap Ventr w/ Lysis of adhesions (Dr. Dwain Sarna)   knee arthroscopic surgery  years ago   right   ROTATOR CUFF REPAIR  1984   Right, [place]   SHOULDER OPEN ROTATOR CUFF REPAIR  02/08/2012   Procedure: ROTATOR CUFF REPAIR SHOULDER OPEN;  Surgeon: Drucilla Schmidt, MD;  Location: [facility];  Service: Orthopedics;  Laterality: Left;  Left Shoulder Open Anterior Acrominectomy Rotator Cuff Repair Open Distal Clavicle Resection ,tissue mend graft, and repair of biceps tendon   THUMB RELEASE  12/1997   Right   TONSILLECTOMY     TOTAL ABDOMINAL HYSTERECTOMY  1985   Due to dysmennorhea   US ECHOCARDIOGRAPHY  06/02/2007    SOCIAL HISTORY: Social History   Socioeconomic History   Marital status: Widowed    Spouse name: Not on file   Number of children: 2   Years of education: Not on file   Highest education level: Not on file  Occupational History  Occupation: Retired    Associate Professor: RETIRED  Tobacco Use   Smoking status: Former    Current packs/day: 0.00    Average packs/day: 2.0 packs/day for 50.0 years (100.0 ttl pk-yrs)    Types: Cigarettes    Start date: 02/06/1955    Quit date: 02/05/2005    Years since quitting: 17.6   Smokeless tobacco: Never  Vaping Use   Vaping status: Never Used  Substance and Sexual Activity   Alcohol use: Yes    Comment: occasionally   Drug use: Never   Sexual activity: Not Currently  Other Topics Concern   Not on file  Social History Narrative   Widowed 2023, 2 children; Enjoys painting      Quit smoking in 2007; no alcohol; lives in [place]; [name]/d lives in [place]. Had own business. Dont drive- sec to neuropathy.    Social Determinants of Health   Financial Resource Strain: Not on file  Food Insecurity: No Food Insecurity (09/27/2022)   Hunger Vital Sign    Worried About Running Out of Food in the Last Year: Never true    Ran Out of Food in the Last Year: Never true  Transportation Needs: No Transportation  Needs (09/27/2022)   PRAPARE - Administrator, Civil Service (Medical): No    Lack of Transportation (Non-Medical): No  Physical Activity: Not on file  Stress: Not on file  Social Connections: Not on file  Intimate Partner Violence: Not At Risk (09/27/2022)   Humiliation, Afraid, Rape, and Kick questionnaire    Fear of Current or Ex-Partner: No    Emotionally Abused: No    Physically Abused: No    Sexually Abused: No    FAMILY HISTORY: Family History  Problem Relation Age of Onset   Heart disease Mother    Thyroid disease Mother    Emphysema Father        One lung   Cystic fibrosis Sister    Hyperthyroidism Sister    Osteoarthritis Brother    Hyperthyroidism Brother    Esophageal cancer Neg Hx    Cancer Neg Hx        Head or neck   Colon cancer Neg Hx    Stomach cancer Neg Hx    Breast cancer Neg Hx     ALLERGIES:  is allergic to antihistamines, diphenhydramine-type; incruse ellipta [umeclidinium bromide]; clarithromycin; codeine; diphenhydramine; pregabalin; sulfonamide derivatives; tiotropium bromide monohydrate; umeclidinium; and vraylar [cariprazine].  MEDICATIONS:  Current Outpatient Medications  Medication Sig Dispense Refill   acetaminophen (TYLENOL) 325 MG tablet Take 2 tablets (650 mg total) by mouth every 6 (six) hours as needed for mild pain, headache, fever or moderate pain.     albuterol (PROAIR HFA) 108 (90 Base) MCG/ACT inhaler Inhale 2 puffs into the lungs every 6 (six) hours as needed for wheezing or shortness of breath. INHALE 2 PUFFS INTO THE LUNGS EVERY 6 HOURS AS NEEDED FOR WHEEZING OR SHORTNESS OF BREATH 3 Inhaler 3   amiodarone (PACERONE) 200 MG tablet Take 1 tablet (200 mg total) by mouth daily. 90 tablet 1   arformoterol (BROVANA) 15 MCG/2ML NEBU Take 2 mLs (15 mcg total) by nebulization at bedtime. 120 mL 6   bisoprolol (ZEBETA) 5 MG tablet Take 0.5 tablets (2.5 mg total) by mouth daily. 45 tablet 1   budesonide (PULMICORT) 0.5 MG/2ML  nebulizer solution Take 2 mLs (0.5 mg total) by nebulization in the morning and at bedtime. 360 mL 3   busPIRone (BUSPAR) 15 MG tablet Take  15 mg by mouth 3 (three) times daily.     cephALEXin (KEFLEX) 500 MG capsule Take 1 capsule (500 mg total) by mouth 4 (four) times daily for 7 days. 28 capsule 0   Cholecalciferol (VITAMIN D) 50 MCG (2000 UT) tablet Take 1 tablet (2,000 Units total) by mouth daily.     Coenzyme Q-10 200 MG CAPS Take 200 mg by mouth daily.     Cyanocobalamin (B-12) 5000 MCG CAPS Take 5,000 mcg by mouth daily.     doxycycline (VIBRAMYCIN) 100 MG capsule Take 1 capsule (100 mg total) by mouth 2 (two) times daily. 14 capsule 0   esomeprazole (NEXIUM) 40 MG capsule Take 1 capsule (40 mg total) by mouth daily at 12 noon. 30 capsule 11   ezetimibe (ZETIA) 10 MG tablet TAKE 1 TABLET BY MOUTH EVERYDAY AT BEDTIME 90 tablet 2   ferrous sulfate 325 (65 FE) MG tablet Take 1 tablet (325 mg total) by mouth daily. 90 tablet 3   FLUoxetine (PROZAC) 20 MG tablet Take 20 mg by mouth 2 (two) times daily.     glucose blood (ONETOUCH VERIO) test strip USE AS INSTRUCTED TO CHECK SUGAR 1 TIME DAILY 100 strip 3   guaiFENesin (MUCINEX) 600 MG 12 hr tablet Take 2 tablets (1,200 mg total) by mouth 2 (two) times daily as needed for cough or to loosen phlegm.     isosorbide mononitrate (IMDUR) 30 MG 24 hr tablet Take 30 mg by mouth daily.     levothyroxine (SYNTHROID) 125 MCG tablet Take 1 tablet (125 mcg total) by mouth daily. 90 tablet 3   magnesium 30 MG tablet Take 1 tablet (30 mg total) by mouth 2 (two) times daily. 180 tablet 3   melatonin 5 MG TABS Take 1 tablet (5 mg total) by mouth at bedtime.  0   metFORMIN (GLUCOPHAGE) 500 MG tablet Take 1 tablet (500 mg total) by mouth every evening. 90 tablet 3   nitroGLYCERIN (NITROSTAT) 0.4 MG SL tablet Place 1 tablet (0.4 mg total) under the tongue every 5 (five) minutes as needed for chest pain. 25 tablet 3   nystatin (MYCOSTATIN) 100000 UNIT/ML suspension  Take 5 mLs (500,000 Units total) by mouth 4 (four) times daily. 120 mL 0   OneTouch Delica Lancets 33G MISC Use 1x a day 100 each 3   OXYGEN Inhale into the lungs. 2lpm     potassium chloride (KLOR-CON) 10 MEQ tablet Take 1 tablet (10 mEq total) by mouth daily. 90 tablet 3   rosuvastatin (CRESTOR) 10 MG tablet TAKE 1 TABLET BY MOUTH EVERY OTHER DAY 45 tablet 2   Spacer/Aero Chamber Mouthpiece MISC Use with  inhaler as needed.  J44.9 1 each 0   torsemide (DEMADEX) 20 MG tablet Take 2 tablets (40 mg total) by mouth daily. Take an additional 20 mg daily as needed for weight greater then 230 pounds 270 tablet 1   Zinc Oxide (TRIPLE PASTE) 12.8 % ointment Apply 1 Application topically as needed for irritation. Every shift.     No current facility-administered medications for this visit.    PHYSICAL EXAMINATION: Vitals:   10/04/22 1334  BP: (!) 125/43  Pulse: (!) 55  Temp: (!) 97.4 F (36.3 C)  SpO2: 97%   Filed Weights   10/04/22 1334  Weight: 233 lb (105.7 kg)   Physical Exam Vitals reviewed.  Constitutional:      Appearance: She is morbidly obese. She is not ill-appearing.     Interventions: Nasal cannula in  place.     Comments: Unaccompanied. Daughter on phone. Fatigued appearing  Cardiovascular:     Rate and Rhythm: Normal rate and regular rhythm.     Pulses: Normal pulses.  Pulmonary:     Effort: No respiratory distress.     Comments: Decreased breath sounds bilaterally.  Scattered wheezing. On oxygen via [place]- 3L. Short of breath with exertion.  Abdominal:     General: There is no distension.     Palpations: Abdomen is soft.     Tenderness: There is no abdominal tenderness.  Skin:    Coloration: Skin is pale.  Neurological:     Mental Status: She is alert and oriented to person, place, and time.     Motor: No weakness.  Psychiatric:        Mood and Affect: Mood normal.        Behavior: Behavior normal.     LABORATORY DATA:  I have reviewed the data as listed Lab  Results  Component Value Date   WBC 6.5 10/04/2022   HGB 7.2 (L) 10/04/2022   HCT 24.5 (L) 10/04/2022   MCV 104.3 (H) 10/04/2022   PLT 162 10/04/2022   Recent Labs    06/29/22 1302 06/29/22 1437 06/30/22 0631 07/08/22 0000 08/03/22 1234 08/09/22 1305 08/19/22 1300 09/15/22 1418 09/17/22 1548  NA 134* 136   < > 143   < > 141 143 142  --   K 4.6 4.6   < > 3.9   < > 4.6 4.5 4.0  --   CL 94* 95*   < > 101   < > 102 99 98  --   CO2 33* 38*   < > 39*   < > 31 36* 37*  --   GLUCOSE 174* 130*   < >  --    < > 108* 118* 121*  --   BUN 33* 33*   < > 40*   < > 26* 33* 35*  --   CREATININE 1.38* 1.36*   < > 1.2*   < > 1.22* 1.33* 1.46* 1.50*  CALCIUM 7.8* 7.8*   < > 8.7   < > 8.2* 8.7* 8.7*  --   GFRNONAA 38* 38*   < >  --   --  44* 39* 35*  --   PROT 6.5 6.3*  --   --   --  6.4*  --   --   --   ALBUMIN 2.9* 2.9*  --  3.2*  --  3.2*  --   --   --   AST 16 16  --  14  --  15  --   --   --   ALT 15 16  --  22  --  15  --   --   --   ALKPHOS 104 100  --  78  --  73  --   --   --   BILITOT 0.5 0.8  --   --   --  0.3  --   --   --    < > = values in this interval not displayed.   Iron/TIBC/Ferritin/ %Sat    Component Value Date/Time   IRON 46 10/04/2022 1305   IRON 52 07/22/2022 0000   TIBC 391 10/04/2022 1305   FERRITIN 42 10/04/2022 1305   IRONPCTSAT 12 10/04/2022 1305     CT CHEST W CONTRAST  Result Date: 09/23/2022 CLINICAL DATA:  Non-small-cell lung cancer. Restaging. * Tracking  Code: BO * EXAM: CT CHEST WITH CONTRAST TECHNIQUE: Multidetector CT imaging of the chest was performed during intravenous contrast administration. RADIATION DOSE REDUCTION: This exam was performed according to the departmental dose-optimization program which includes automated exposure control, adjustment of the mA and/or kV according to patient size and/or use of iterative reconstruction technique. CONTRAST:  60mL OMNIPAQUE IOHEXOL 300 MG/ML  SOLN COMPARISON:  03/19/2022 FINDINGS: Cardiovascular: The  heart size is normal. No substantial pericardial effusion. Coronary artery calcification is evident. Mitral annular calcification and aortic valve calcification evident. Mediastinum/Nodes: No mediastinal lymphadenopathy. There is no hilar lymphadenopathy. The esophagus has normal imaging features. There is no axillary lymphadenopathy. Lungs/Pleura: Centrilobular emphsyema noted. Previously described scarring in the right apex is stable at 3.8 x 1.4 cm (22/6) compared to 3.8 x 1.4 cm previously. Several additional scattered tiny nodular densities in both lungs described previously are stable including previously reported central left lower lobe 6 mm infrahilar nodule on 76/6. The adjacent tiny 2 mm anterior right upper lobe pulmonary nodules identified as new on the previous study have resolved in the interval. No new suspicious pulmonary nodule or mass on today's study. No focal airspace consolidation. No pleural effusion. Upper Abdomen: As before, small cysts are identified in both kidneys. Additional tiny hypoattenuating renal lesions are too small to characterize but statistically most likely benign. No followup imaging is recommended. 4 cm lesion posterior aspect lateral segment left liver is similar to prior incompatible hemangioma enhancing lesion in the dome of the liver is also stable, likely hemangioma. Musculoskeletal: Stable sclerotic lesion anterior left seventh rib mild superior endplate compression deformity at T5 is similar to prior. IMPRESSION: 1. Stable exam. No new or progressive findings. 2. Similar presumed scarring in the right apex. 3. Stable scattered tiny bilateral pulmonary nodules. The 2 new indistinct tiny anterior right upper lobe pulmonary nodules identified on the previous study have resolved in the interval. 4. No change in sclerotic lesion anterior left seventh rib. 5. Stable enhancing lesions in the liver, likely hemangiomas. 6. Aortic Atherosclerosis (ICD10-I70.0) and Emphysema  (ICD10-J43.9). Electronically Signed   By: Kennith Center M.D.   On: 09/23/2022 10:05     Assessment & Plan:   Symptomatic Anemia- 12/2020 ferritin 7. Symptomatic and chronic intermittent since 2010. S/p IV Venofer. April 2024, anemia worsening despite iron infusions. Iron sat 10%, ferritin 26. Question other etiologies including blood loss vs primary bone marrow disorders- additional workup was declined and she elected for symptomatic care. Hemoglobin baseline around 9-10. Dropped to 8.5 in February 2024 --> 7.2 (06/2022) -->> 6.4 (06/23/22). She received 2 units of pRBCs. Today, hemoglobin 7.2 despite 7 venofer infusions over past 2 months. Iron studies pending at time of visit but I discussed with patient that findings concerning for blood loss. Hmg 7.2 today. Symptomatic. Discussed in detail manifestations of anemia and potential outcomes including cardiac or other ischemic events including or leading to death. Patient and daughter would like to avoid hospitalization. Will plan to transfuse on Thursday, 1 unit pRBCs. Hold lasix in setting of heart failure. May consider if she requires second unit. Patient does have positive antibody screen and requires increased time to obtain blood product.  Iron Deficiency Anemia- increased iron requirement with drops in hemoglobin despite iron and previous transfusions. Iron stores continue to drop despite replenishment. Supporting bleeding. Declines capsule study or GI workup as daughter and patient feel she is unlikely candidate for interventions or procedures. They prefer supportive care. Plan for venofer 200 mg today. Will likely  need to continue weekly venofer.  Previous allergic reaction- she has been tolerating venofer well but previously experienced possible allergic reaction and has been premedicated with solumedrol.  Etiology of Iron Deficiency- unclear- CT 03/18/21 revealed mild wall thickening of the proximal duodenum, at least in part d/t under distension,  possibly reflective of PUD/duodenitis, s/p eval with Dr Christella Hartigan in [place]/ GI in June 2023. Per patient, she was not a candidate for upper and lower endoscopies. Last EGD 02/2016 revealed small hiatal hernia. Colonoscopy with sigmoid diverticulosis, lipoma of sigmoid colon, internal hemorrhoids. Given ongoing drops in her hemoglobin and iron stores despite replacement, I expressed concern for blood loss. Patient denies blood in stool. She was evaluated by GI in June 2024. Capsule endoscopy was offered but patient declined as she is likely not a candidate for interventions or procedures. I again offered GI eval today and daughter and patient decline. Wish to only focus on supportive care.    CKD- stage IIIb. GFR 44. Discussed the etiology of anemia in chronic kidney disease. Likely contributing to anemia as well. Recommend starting EPO for hemoglobin < 10 along with optimization of iron stores. Reviewed potential risk of VTE which primarily occurred with hemoglobin > 10. To minimize risk of serious ARs, cardiovascular reactions, plan to use lowest sufficient dose. Patient and daughter agree to proceed. Will plan to start retacrit 20,000 units every other week on 10/05/22 for hemoglobin of 7.2.   B12 deficiency- 472 today. Decreased but normal. Monitor.  Stage I Lung Cancer- Aug 2021 right lung, NSCLC. S/p SBRT with Dr. Mitzi Hansen in [place]. 03/2022 CT negative for recurrent disease.  COPD & Chronic respiratory failure & Diastolic chf- on home oxygen. Managed by cardiology. Recommended cardiac evaluation and daughter has scheudled appt.  Metabolic encephalopathy- s/p hospitalization. D/t UTI. Discharged to rehab, now home.    UTI- 70,000 colonies of proteus on recent culture. On cephalexin. Managed by PCP.  Toenail avulsion & diabetic toenails- s/p referral and evaluation with podiatry.  Goals of care: discussed potentially life limiting illness of anemia, bleeding. Potential life threatening of delaying transfusion,  potential cardiac or ischemic event vs fall. Patient and daughter verbalize desire for CPR however, patient would not want to be on life support. Advised of discordant care preference. Recommend review of MOST form at next appointment to further clarify wishes.   Appts after 1 pm.  Disposition:  Venofer today 7/30- new retacrit 8/1- 1 unit pRBCs 1 week- lab (H&H, hold tube), venofer 2 weeks- lab (cbc, hold tube), Dr Donneta Romberg or me, +/- venofer or EPO- la  No problem-specific Assessment & Plan notes found for this encounter.  All questions were answered. The patient knows to call the clinic with any problems, questions or concerns.   Alinda Dooms, NP 10/04/2022   CC: Dr Tomasa Rand, Dr Donneta Romberg, Dr. Para March

## 2022-10-05 ENCOUNTER — Ambulatory Visit: Payer: Self-pay

## 2022-10-05 ENCOUNTER — Other Ambulatory Visit: Payer: Self-pay

## 2022-10-05 ENCOUNTER — Inpatient Hospital Stay: Payer: Medicare Other

## 2022-10-05 ENCOUNTER — Ambulatory Visit: Payer: Self-pay | Admitting: Nurse Practitioner

## 2022-10-05 VITALS — BP 128/82 | HR 65

## 2022-10-05 DIAGNOSIS — J449 Chronic obstructive pulmonary disease, unspecified: Secondary | ICD-10-CM | POA: Diagnosis not present

## 2022-10-05 DIAGNOSIS — D649 Anemia, unspecified: Secondary | ICD-10-CM

## 2022-10-05 DIAGNOSIS — Z85118 Personal history of other malignant neoplasm of bronchus and lung: Secondary | ICD-10-CM | POA: Diagnosis not present

## 2022-10-05 DIAGNOSIS — D631 Anemia in chronic kidney disease: Secondary | ICD-10-CM | POA: Diagnosis not present

## 2022-10-05 DIAGNOSIS — N1832 Chronic kidney disease, stage 3b: Secondary | ICD-10-CM | POA: Diagnosis not present

## 2022-10-05 DIAGNOSIS — Z7189 Other specified counseling: Secondary | ICD-10-CM | POA: Insufficient documentation

## 2022-10-05 DIAGNOSIS — I130 Hypertensive heart and chronic kidney disease with heart failure and stage 1 through stage 4 chronic kidney disease, or unspecified chronic kidney disease: Secondary | ICD-10-CM | POA: Diagnosis not present

## 2022-10-05 DIAGNOSIS — D509 Iron deficiency anemia, unspecified: Secondary | ICD-10-CM | POA: Diagnosis not present

## 2022-10-05 MED ORDER — EPOETIN ALFA-EPBX 20000 UNIT/ML IJ SOLN
20000.0000 [IU] | Freq: Once | INTRAMUSCULAR | Status: AC
  Administered 2022-10-05: 20000 [IU] via SUBCUTANEOUS
  Filled 2022-10-05: qty 1

## 2022-10-06 ENCOUNTER — Encounter (INDEPENDENT_AMBULATORY_CARE_PROVIDER_SITE_OTHER): Payer: Self-pay

## 2022-10-06 DIAGNOSIS — E1142 Type 2 diabetes mellitus with diabetic polyneuropathy: Secondary | ICD-10-CM | POA: Diagnosis not present

## 2022-10-06 DIAGNOSIS — I480 Paroxysmal atrial fibrillation: Secondary | ICD-10-CM | POA: Diagnosis not present

## 2022-10-06 DIAGNOSIS — I110 Hypertensive heart disease with heart failure: Secondary | ICD-10-CM | POA: Diagnosis not present

## 2022-10-06 DIAGNOSIS — J9621 Acute and chronic respiratory failure with hypoxia: Secondary | ICD-10-CM | POA: Diagnosis not present

## 2022-10-06 DIAGNOSIS — H35372 Puckering of macula, left eye: Secondary | ICD-10-CM | POA: Diagnosis not present

## 2022-10-06 DIAGNOSIS — H2513 Age-related nuclear cataract, bilateral: Secondary | ICD-10-CM | POA: Diagnosis not present

## 2022-10-06 DIAGNOSIS — J9622 Acute and chronic respiratory failure with hypercapnia: Secondary | ICD-10-CM | POA: Diagnosis not present

## 2022-10-06 DIAGNOSIS — G903 Multi-system degeneration of the autonomic nervous system: Secondary | ICD-10-CM | POA: Diagnosis not present

## 2022-10-06 DIAGNOSIS — H353131 Nonexudative age-related macular degeneration, bilateral, early dry stage: Secondary | ICD-10-CM | POA: Diagnosis not present

## 2022-10-06 LAB — HM DIABETES EYE EXAM

## 2022-10-07 ENCOUNTER — Inpatient Hospital Stay: Payer: Medicare Other | Attending: Internal Medicine

## 2022-10-07 DIAGNOSIS — E538 Deficiency of other specified B group vitamins: Secondary | ICD-10-CM | POA: Insufficient documentation

## 2022-10-07 DIAGNOSIS — N1832 Chronic kidney disease, stage 3b: Secondary | ICD-10-CM | POA: Diagnosis not present

## 2022-10-07 DIAGNOSIS — D509 Iron deficiency anemia, unspecified: Secondary | ICD-10-CM | POA: Diagnosis not present

## 2022-10-07 DIAGNOSIS — D631 Anemia in chronic kidney disease: Secondary | ICD-10-CM | POA: Diagnosis not present

## 2022-10-07 DIAGNOSIS — D649 Anemia, unspecified: Secondary | ICD-10-CM

## 2022-10-07 MED ORDER — SODIUM CHLORIDE 0.9% IV SOLUTION
250.0000 mL | Freq: Once | INTRAVENOUS | Status: AC
  Administered 2022-10-07: 250 mL via INTRAVENOUS
  Filled 2022-10-07: qty 250

## 2022-10-07 MED ORDER — ACETAMINOPHEN 325 MG PO TABS
650.0000 mg | ORAL_TABLET | Freq: Once | ORAL | Status: AC
  Administered 2022-10-07: 650 mg via ORAL

## 2022-10-08 ENCOUNTER — Telehealth: Payer: Self-pay | Admitting: Nurse Practitioner

## 2022-10-08 DIAGNOSIS — I110 Hypertensive heart disease with heart failure: Secondary | ICD-10-CM | POA: Diagnosis not present

## 2022-10-08 DIAGNOSIS — I480 Paroxysmal atrial fibrillation: Secondary | ICD-10-CM | POA: Diagnosis not present

## 2022-10-08 DIAGNOSIS — E1142 Type 2 diabetes mellitus with diabetic polyneuropathy: Secondary | ICD-10-CM | POA: Diagnosis not present

## 2022-10-08 DIAGNOSIS — J9621 Acute and chronic respiratory failure with hypoxia: Secondary | ICD-10-CM | POA: Diagnosis not present

## 2022-10-08 DIAGNOSIS — G903 Multi-system degeneration of the autonomic nervous system: Secondary | ICD-10-CM | POA: Diagnosis not present

## 2022-10-08 DIAGNOSIS — J9622 Acute and chronic respiratory failure with hypercapnia: Secondary | ICD-10-CM | POA: Diagnosis not present

## 2022-10-08 NOTE — Telephone Encounter (Signed)
Called daughter to check on patient's symptoms post transfusion. No answer. Left vm.

## 2022-10-11 ENCOUNTER — Other Ambulatory Visit: Payer: Self-pay | Admitting: *Deleted

## 2022-10-11 ENCOUNTER — Other Ambulatory Visit: Payer: Medicare Other

## 2022-10-11 ENCOUNTER — Ambulatory Visit: Payer: Medicare Other

## 2022-10-11 DIAGNOSIS — J9621 Acute and chronic respiratory failure with hypoxia: Secondary | ICD-10-CM | POA: Diagnosis not present

## 2022-10-11 DIAGNOSIS — N390 Urinary tract infection, site not specified: Secondary | ICD-10-CM | POA: Diagnosis not present

## 2022-10-11 DIAGNOSIS — E785 Hyperlipidemia, unspecified: Secondary | ICD-10-CM | POA: Diagnosis not present

## 2022-10-11 DIAGNOSIS — Z9981 Dependence on supplemental oxygen: Secondary | ICD-10-CM | POA: Diagnosis not present

## 2022-10-11 DIAGNOSIS — Z85118 Personal history of other malignant neoplasm of bronchus and lung: Secondary | ICD-10-CM | POA: Diagnosis not present

## 2022-10-11 DIAGNOSIS — I5033 Acute on chronic diastolic (congestive) heart failure: Secondary | ICD-10-CM | POA: Diagnosis not present

## 2022-10-11 DIAGNOSIS — Z7984 Long term (current) use of oral hypoglycemic drugs: Secondary | ICD-10-CM | POA: Diagnosis not present

## 2022-10-11 DIAGNOSIS — F32A Depression, unspecified: Secondary | ICD-10-CM | POA: Diagnosis not present

## 2022-10-11 DIAGNOSIS — J9622 Acute and chronic respiratory failure with hypercapnia: Secondary | ICD-10-CM | POA: Diagnosis not present

## 2022-10-11 DIAGNOSIS — F419 Anxiety disorder, unspecified: Secondary | ICD-10-CM | POA: Diagnosis not present

## 2022-10-11 DIAGNOSIS — I350 Nonrheumatic aortic (valve) stenosis: Secondary | ICD-10-CM | POA: Diagnosis not present

## 2022-10-11 DIAGNOSIS — B9620 Unspecified Escherichia coli [E. coli] as the cause of diseases classified elsewhere: Secondary | ICD-10-CM | POA: Diagnosis not present

## 2022-10-11 DIAGNOSIS — E039 Hypothyroidism, unspecified: Secondary | ICD-10-CM | POA: Diagnosis not present

## 2022-10-11 DIAGNOSIS — D509 Iron deficiency anemia, unspecified: Secondary | ICD-10-CM | POA: Diagnosis not present

## 2022-10-11 DIAGNOSIS — J4489 Other specified chronic obstructive pulmonary disease: Secondary | ICD-10-CM | POA: Diagnosis not present

## 2022-10-11 DIAGNOSIS — K219 Gastro-esophageal reflux disease without esophagitis: Secondary | ICD-10-CM | POA: Diagnosis not present

## 2022-10-11 DIAGNOSIS — I480 Paroxysmal atrial fibrillation: Secondary | ICD-10-CM | POA: Diagnosis not present

## 2022-10-11 DIAGNOSIS — E662 Morbid (severe) obesity with alveolar hypoventilation: Secondary | ICD-10-CM | POA: Diagnosis not present

## 2022-10-11 DIAGNOSIS — I2510 Atherosclerotic heart disease of native coronary artery without angina pectoris: Secondary | ICD-10-CM | POA: Diagnosis not present

## 2022-10-11 DIAGNOSIS — Z6841 Body Mass Index (BMI) 40.0 and over, adult: Secondary | ICD-10-CM | POA: Diagnosis not present

## 2022-10-11 DIAGNOSIS — G4733 Obstructive sleep apnea (adult) (pediatric): Secondary | ICD-10-CM | POA: Diagnosis not present

## 2022-10-11 DIAGNOSIS — J439 Emphysema, unspecified: Secondary | ICD-10-CM | POA: Diagnosis not present

## 2022-10-11 DIAGNOSIS — D649 Anemia, unspecified: Secondary | ICD-10-CM

## 2022-10-11 DIAGNOSIS — I110 Hypertensive heart disease with heart failure: Secondary | ICD-10-CM | POA: Diagnosis not present

## 2022-10-11 DIAGNOSIS — E1142 Type 2 diabetes mellitus with diabetic polyneuropathy: Secondary | ICD-10-CM | POA: Diagnosis not present

## 2022-10-12 ENCOUNTER — Inpatient Hospital Stay: Payer: Medicare Other

## 2022-10-12 VITALS — BP 110/54 | HR 61 | Temp 97.5°F

## 2022-10-12 DIAGNOSIS — D509 Iron deficiency anemia, unspecified: Secondary | ICD-10-CM | POA: Diagnosis not present

## 2022-10-12 DIAGNOSIS — D649 Anemia, unspecified: Secondary | ICD-10-CM

## 2022-10-12 DIAGNOSIS — E538 Deficiency of other specified B group vitamins: Secondary | ICD-10-CM | POA: Diagnosis not present

## 2022-10-12 DIAGNOSIS — D631 Anemia in chronic kidney disease: Secondary | ICD-10-CM | POA: Diagnosis not present

## 2022-10-12 DIAGNOSIS — N1832 Chronic kidney disease, stage 3b: Secondary | ICD-10-CM | POA: Diagnosis not present

## 2022-10-12 LAB — HEMOGLOBIN AND HEMATOCRIT (CANCER CENTER ONLY)
HCT: 29.2 % — ABNORMAL LOW (ref 36.0–46.0)
Hemoglobin: 8.6 g/dL — ABNORMAL LOW (ref 12.0–15.0)

## 2022-10-12 LAB — SAMPLE TO BLOOD BANK

## 2022-10-12 MED ORDER — SODIUM CHLORIDE 0.9 % IV SOLN
200.0000 mg | Freq: Once | INTRAVENOUS | Status: AC
  Administered 2022-10-12: 200 mg via INTRAVENOUS
  Filled 2022-10-12: qty 200

## 2022-10-12 MED ORDER — METHYLPREDNISOLONE SODIUM SUCC 125 MG IJ SOLR
20.0000 mg | Freq: Once | INTRAMUSCULAR | Status: AC | PRN
  Administered 2022-10-12: 20 mg via INTRAVENOUS

## 2022-10-12 MED ORDER — SODIUM CHLORIDE 0.9 % IV SOLN
Freq: Once | INTRAVENOUS | Status: AC
  Filled 2022-10-12: qty 250

## 2022-10-12 MED ORDER — METHYLPREDNISOLONE SODIUM SUCC 40 MG IJ SOLR: 20.0000 mg | Freq: Once | INTRAMUSCULAR | Status: DC | PRN

## 2022-10-13 ENCOUNTER — Telehealth: Payer: Self-pay | Admitting: Family Medicine

## 2022-10-13 DIAGNOSIS — E1142 Type 2 diabetes mellitus with diabetic polyneuropathy: Secondary | ICD-10-CM | POA: Diagnosis not present

## 2022-10-13 DIAGNOSIS — J9622 Acute and chronic respiratory failure with hypercapnia: Secondary | ICD-10-CM | POA: Diagnosis not present

## 2022-10-13 DIAGNOSIS — I110 Hypertensive heart disease with heart failure: Secondary | ICD-10-CM | POA: Diagnosis not present

## 2022-10-13 DIAGNOSIS — J9621 Acute and chronic respiratory failure with hypoxia: Secondary | ICD-10-CM | POA: Diagnosis not present

## 2022-10-13 DIAGNOSIS — I5033 Acute on chronic diastolic (congestive) heart failure: Secondary | ICD-10-CM | POA: Diagnosis not present

## 2022-10-13 DIAGNOSIS — I480 Paroxysmal atrial fibrillation: Secondary | ICD-10-CM | POA: Diagnosis not present

## 2022-10-13 NOTE — Progress Notes (Deleted)
Cardiology Office Note:    Date:  10/13/2022   ID:  Alexandria [name], DOB 1935-04-10, MRN [id number]  PCP:  Alexandria Nam, MD  Alexandria [name] Cardiologist:  Alexandria Nordmann, MD  [name] Electrophysiologist:  None   Referring MD: Alexandria Nam, MD   Chief Complaint: 2 month follow-up  History of Present Illness:    Alexandria [name] is a 87 y.o. female with a hx of chronic HFpEF, coronary calcifications, lymphedema, HTN, HLD, COPD on home O2, DM2, CKD stage 3, mild aortic stenosis, morbid obesity, iron deficiency anemia requiring IV iron and blood transfusions, sleep apnea, severe deconditioning, right upper lobe lung cancer, depression who presents for follow-up.   The patient lives locally with a caregiver. She requires assistance for all transfers and does not walk. She has chronic dyspnea on exertion and uses O2 at home. She is followed by oncology in the setting of iron deficiency anemia with weekly iron infusions. She was admitted to [facility] April 2024 in the setting of fevers, chills and lethargy. She had stable anemia with mild leukocytosis, normal troponin and UA showing UTI. She developed Afib RVR in the 140s after removing her Bipap at night and became hypoxic. She was initially placed on IV amiodarone however, she converted to NSR and amiodarone was discontinued in the setting of bradycardia. Echo showed normal LVEF with anEF of 60-65%, mild MR, mild AS. No oral a/c was added in the setting of anemia with high fall risk.  Follow-up heart monitor showed NSR, 1 brief run of SVT, no afib, 2.2% PVC burden.   She was seen in the office and was bradycardic and bisoprolol was reduced to 2.5mg  daily.   The patient was last seen 08/2022 and was stable from a cardiac perspective.   Today,  Past Medical History:  Diagnosis Date   Allergy, unspecified not elsewhere classified    Anxiety    Aortic stenosis, mild 03/30/2022   a. TTE 03/29/2022: EF 55-60, no RWMA, GR 1 DD, normal RVSF,  mild aortic stenosis (mean 10, V-max 212 cm/s, DI 0.70)   Cataract    Chronic diastolic congestive heart failure (HCC)    a. 06/2022 Echo: EF 60-65%, no rwma, nl RV fxn, mild MR, mild AS.   COPD (chronic obstructive pulmonary disease) (HCC) 03/08/1998   PFTs 12/12/2002 FEV 1 1.42 (64%) ratio 58 with no better after B2 and DLCO75%; PFTs 11/20/09 FEV1 1.50 (73%) ratio 50 no better after B2 with DLCO 62%; Hfa 50% 11/20/2009 >75%, 01/13/10 p coaching   Coronary artery calcification seen on CT scan    a. 2012 Neg stress test.   Depression 12/07/1998   Diabetes mellitus type II 02/05/2006   Dr. Elvera Lennox with endo   Disorders of bursae and tendons in shoulder region, unspecified    Rotator cuff syndrome, right   E. coli bacteremia    Esophagitis    GERD (gastroesophageal reflux disease)    History of UTI    Hyperlipidemia 10/04/2000   Hypertension 03/08/1992   Hypothyroidism 03/08/1968   Iron deficiency anemia    Lung nodule    radiation starts 10-08-2019   Malignant neoplasm of right upper lobe of lung (HCC) 07/24/2019   Obesity    NOS   OSA (obstructive sleep apnea)    PSG 01/27/10 AHI 13, pt does not know CPAP settings   PAF (paroxysmal atrial fibrillation) (HCC)    a. 06/2022 in setting of UTI/encephalopathy-->converted on amio; b. CHA2DS2VASc = 6-->No OAC 2/2 chronic  anemia/fall risk; c. 06/2022 Zio: Predominantly sinus bradycardia at 54 (3-158)'s.  1 brief run of SVT.  2.2% PVC burden.   Peripheral neuropathy    Likely due to DM per Dr. Tresa Endo hernia     Past Surgical History:  Procedure Laterality Date   ABD U/S  03/19/1999   Nml x2 foci in liver   ADENOSINE MYOVIEW  06/02/2007   Nml   CARDIOLITE PERSANTINE  08/24/2000   Nml   CAROTID U/S  08/24/2000   1-39% ICA stenosis   CAROTID U/S  06/02/2007   No apprec change    CARPAL TUNNEL RELEASE  12/1997   Right   CESAREAN SECTION     x2 Breech/ repeat   CHOLECYSTECTOMY  1997   COLONOSCOPY WITH PROPOFOL N/A 02/12/2016    Procedure: COLONOSCOPY WITH PROPOFOL;  Surgeon: Rachael Fee, MD;  Location: [facility];  Service: Endoscopy;  Laterality: N/A;   CT ABD W & PELVIS WO/W CM     Abd hemangiomas of liver, 1 cm R renal cyst   DENTAL SURGERY  2016   Implants   DEXA  07/03/2003   Nml   ESOPHAGOGASTRODUODENOSCOPY  12/05/1997   Nml (due to hoarseness)   ESOPHAGOGASTRODUODENOSCOPY (EGD) WITH PROPOFOL N/A 02/12/2016   Procedure: ESOPHAGOGASTRODUODENOSCOPY (EGD) WITH PROPOFOL;  Surgeon: Rachael Fee, MD;  Location: [facility];  Service: Endoscopy;  Laterality: N/A;   GALLBLADDER SURGERY     HERNIA REPAIR  01/24/2009   Lap Ventr w/ Lysis of adhesions (Dr. Dwain Sarna)   knee arthroscopic surgery  years ago   right   ROTATOR CUFF REPAIR  1984   Right, [place]   SHOULDER OPEN ROTATOR CUFF REPAIR  02/08/2012   Procedure: ROTATOR CUFF REPAIR SHOULDER OPEN;  Surgeon: Drucilla Schmidt, MD;  Location: [facility];  Service: Orthopedics;  Laterality: Left;  Left Shoulder Open Anterior Acrominectomy Rotator Cuff Repair Open Distal Clavicle Resection ,tissue mend graft, and repair of biceps tendon   THUMB RELEASE  12/1997   Right   TONSILLECTOMY     TOTAL ABDOMINAL HYSTERECTOMY  1985   Due to dysmennorhea   US ECHOCARDIOGRAPHY  06/02/2007    Current Medications: No outpatient medications have been marked as taking for the 10/14/22 encounter (Appointment) with Alexandria [name],  H, PA-C.     Allergies:   Antihistamines, diphenhydramine-type; Incruse ellipta [umeclidinium bromide]; Clarithromycin; Codeine; Diphenhydramine; Pregabalin; Sulfonamide derivatives; Tiotropium bromide monohydrate; Umeclidinium; and Vraylar [cariprazine]   Social History   Socioeconomic History   Marital status: Widowed    Spouse name: Not on file   Number of children: 2   Years of education: Not on file   Highest education level: Not on file  Occupational History   Occupation: Retired    Associate Professor: RETIRED  Tobacco Use   Smoking  status: Former    Current packs/day: 0.00    Average packs/day: 2.0 packs/day for 50.0 years (100.0 ttl pk-yrs)    Types: Cigarettes    Start date: 02/06/1955    Quit date: 02/05/2005    Years since quitting: 17.6   Smokeless tobacco: Never  Vaping Use   Vaping status: Never Used  Substance and Sexual Activity   Alcohol use: Yes    Comment: occasionally   Drug use: Never   Sexual activity: Not Currently  Other Topics Concern   Not on file  Social History Narrative   Widowed 2023, 2 children; Enjoys painting      Quit smoking in 2007; no alcohol; lives  in [place]; [name]/d lives in [place]. Had own business. Dont drive- sec to neuropathy.    Social Determinants of Health   Financial Resource Strain: Not on file  Food Insecurity: No Food Insecurity (09/27/2022)   Hunger Vital Sign    Worried About Running Out of Food in the Last Year: Never true    Ran Out of Food in the Last Year: Never true  Transportation Needs: No Transportation Needs (09/27/2022)   PRAPARE - Administrator, Civil Service (Medical): No    Lack of Transportation (Non-Medical): No  Physical Activity: Not on file  Stress: Not on file  Social Connections: Not on file     Family History: The patient's ***family history includes Cystic fibrosis in her sister; Emphysema in her father; Heart disease in her mother; Hyperthyroidism in her brother and sister; Osteoarthritis in her brother; Thyroid disease in her mother. There is no history of Esophageal cancer, Cancer, Colon cancer, Stomach cancer, or Breast cancer.  ROS:   Please see the history of present illness.    *** All other systems reviewed and are negative.  EKGs/Labs/Other Studies Reviewed:    The following studies were reviewed today: ***  EKG:  EKG is *** ordered today.  The ekg ordered today demonstrates ***  Recent Labs: 04/22/2022: Pro B Natriuretic peptide (BNP) 69.0 06/29/2022: B Natriuretic Peptide 245.1 06/30/2022: TSH  0.899 07/07/2022: Magnesium 2.0 08/09/2022: ALT 15 09/15/2022: BUN 35; Potassium 4.0; Sodium 142 09/17/2022: Creatinine, Ser 1.50 10/04/2022: Platelet Count 162 10/12/2022: Hemoglobin 8.6  Recent Lipid Panel    Component Value Date/Time   CHOL 170 09/14/2022 1336   TRIG 154.0 (H) 09/14/2022 1336   HDL 66.90 09/14/2022 1336   CHOLHDL 3 09/14/2022 1336   VLDL 30.8 09/14/2022 1336   LDLCALC 72 09/14/2022 1336   LDLDIRECT 63.0 07/29/2015 1308     Risk Assessment/Calculations:   {Does this patient have ATRIAL FIBRILLATION?:[id number]}   Physical Exam:    VS:  LMP  (LMP Unknown)     Wt Readings from Last 3 Encounters:  10/04/22 233 lb (105.7 kg)  09/15/22 233 lb 12.8 oz (106.1 kg)  09/06/22 228 lb (103.4 kg)     GEN: *** Well nourished, well developed in no acute distress HEENT: Normal NECK: No JVD; No carotid bruits LYMPHATICS: No lymphadenopathy CARDIAC: ***RRR, no murmurs, rubs, gallops RESPIRATORY:  Clear to auscultation without rales, wheezing or rhonchi  ABDOMEN: Soft, non-tender, non-distended MUSCULOSKELETAL:  No edema; No deformity  SKIN: Warm and dry NEUROLOGIC:  Alert and oriented x 3 PSYCHIATRIC:  Normal affect   ASSESSMENT:    No diagnosis found. PLAN:    In order of problems listed above:  ***  Disposition: Follow up {follow up:15908} with ***   Shared Decision Making/Informed Consent   {Are you ordering a CV Procedure (e.g. stress test, cath, DCCV, TEE, etc)?   Press F2        :[id number]}    Signed,  David Stall, PA-C  10/13/2022 1:09 PM    [name]

## 2022-10-13 NOTE — Telephone Encounter (Signed)
Home Health verbal orders Caller Name: Liliane Shi  Agency Name: [facility]   Callback number: [phone]  Requesting OT/PT/Skilled nursing/Social Work/Speech: nursing - CONTINUED   Reason:  Frequency: once a week for nine weeks   Please forward to [facility] pool or providers CMA

## 2022-10-14 ENCOUNTER — Ambulatory Visit: Payer: Medicare Other | Admitting: Medical

## 2022-10-14 NOTE — Telephone Encounter (Signed)
Verbal orders given to Amiee.

## 2022-10-14 NOTE — Telephone Encounter (Signed)
Please give the order.  Thanks.   

## 2022-10-15 DIAGNOSIS — E1142 Type 2 diabetes mellitus with diabetic polyneuropathy: Secondary | ICD-10-CM | POA: Diagnosis not present

## 2022-10-15 DIAGNOSIS — J9621 Acute and chronic respiratory failure with hypoxia: Secondary | ICD-10-CM | POA: Diagnosis not present

## 2022-10-15 DIAGNOSIS — I480 Paroxysmal atrial fibrillation: Secondary | ICD-10-CM | POA: Diagnosis not present

## 2022-10-15 DIAGNOSIS — I5033 Acute on chronic diastolic (congestive) heart failure: Secondary | ICD-10-CM | POA: Diagnosis not present

## 2022-10-15 DIAGNOSIS — J9622 Acute and chronic respiratory failure with hypercapnia: Secondary | ICD-10-CM | POA: Diagnosis not present

## 2022-10-15 DIAGNOSIS — I110 Hypertensive heart disease with heart failure: Secondary | ICD-10-CM | POA: Diagnosis not present

## 2022-10-18 ENCOUNTER — Encounter: Payer: Self-pay | Admitting: Family Medicine

## 2022-10-18 ENCOUNTER — Encounter: Payer: Self-pay | Admitting: Nurse Practitioner

## 2022-10-18 ENCOUNTER — Other Ambulatory Visit: Payer: Self-pay | Admitting: *Deleted

## 2022-10-18 ENCOUNTER — Ambulatory Visit (INDEPENDENT_AMBULATORY_CARE_PROVIDER_SITE_OTHER): Payer: Medicare Other | Admitting: Nurse Practitioner

## 2022-10-18 VITALS — BP 108/50 | HR 54 | Ht 65.0 in | Wt 228.0 lb

## 2022-10-18 DIAGNOSIS — J9611 Chronic respiratory failure with hypoxia: Secondary | ICD-10-CM

## 2022-10-18 DIAGNOSIS — J4489 Other specified chronic obstructive pulmonary disease: Secondary | ICD-10-CM

## 2022-10-18 DIAGNOSIS — G4733 Obstructive sleep apnea (adult) (pediatric): Secondary | ICD-10-CM

## 2022-10-18 DIAGNOSIS — C3411 Malignant neoplasm of upper lobe, right bronchus or lung: Secondary | ICD-10-CM

## 2022-10-18 DIAGNOSIS — J439 Emphysema, unspecified: Secondary | ICD-10-CM | POA: Diagnosis not present

## 2022-10-18 DIAGNOSIS — D649 Anemia, unspecified: Secondary | ICD-10-CM

## 2022-10-18 DIAGNOSIS — N3000 Acute cystitis without hematuria: Secondary | ICD-10-CM

## 2022-10-18 DIAGNOSIS — D500 Iron deficiency anemia secondary to blood loss (chronic): Secondary | ICD-10-CM

## 2022-10-18 DIAGNOSIS — I5032 Chronic diastolic (congestive) heart failure: Secondary | ICD-10-CM

## 2022-10-18 MED ORDER — PREDNISONE 20 MG PO TABS
40.0000 mg | ORAL_TABLET | Freq: Every day | ORAL | 0 refills | Status: AC
Start: 2022-10-18 — End: 2022-10-23

## 2022-10-18 NOTE — Patient Instructions (Addendum)
Continue levalbuterol (Xopenex) 3 mL every 6 hours as needed for shortness of breath or wheezing.  Continue allegra 180 mg daily as needed for allergies  Continue flonase 2 sprays each nostril daily for allergies/runny nose/nasal congestion Continue arformoterol neb 2 mL. Try increasing to twice a day and see if you can tolerate this. If not, go back to once daily  Continue budesonide neb 2 mL Twice daily. Brush tongue and rinse mouth afterwards Continue nexium (ezetimibe) 1 tab daily Continue supplemental oxygen 2-3 lpm for goal oxygen 88-90%  Guaifenesin 402 064 3533 mg Twice daily for chest congestion Use your flutter valve 10 times an hour after your nebulizer treatments in the morning and evening Trial prednisone 40 mg daily for 5 days. Take in AM with food. Let me know if this helps with your breathing or if you feel worse coming off of it   Follow up with radiation oncology as scheduled.   Follow up with Dr. Para March regarding your UTI symptoms and possible recurrence after coming off the antibiotics   Follow up with Dr. Craige Cotta or Florentina Addison ,NP in 2-3 weeks. If symptoms do not improve or worsen, please contact office for sooner follow up or seek emergency care.

## 2022-10-18 NOTE — Progress Notes (Unsigned)
@Patient  ID: Alexandria [name], female    DOB: June 11, 1935, 87 y.o.   MRN: [id number]  Chief Complaint  Patient presents with   Follow-up    Sob, pt has been using her inhaler and nebs     Referring provider: Joaquim Nam, MD  HPI: 87 year old female, former smoker followed for COPD and chronic respiratory failure.  She also has a history of OSA but intolerant of CPAP.  She is a patient of Dr. Evlyn Courier and last seen in office on 12/01/2021.  Past medical history significant for CHF, IDA, hypertension, CAD, PAD, hypothyroidism, DM 2, GERD, HLD, obesity.  She also has a history of right upper lobe lung neoplasm status post SBRT and followed by radiation oncology.  TEST/EVENTS:  07/15/2021 CT chest with contrast: Atherosclerosis.  Within the right upper lobe there is a area of nodular architectural distortion, groundglass attenuation and fibrosis compatible with changes due to external beam radiation.  Within this there is a new area of nodular density which measures 1.3 x 1.1 cm.  In the absence of interval radiation therapy, recurrent tumor cannot be excluded.  There is a new tiny right pleural effusion as well.  There is scattered tiny peripheral predominant lung nodules which appear new from previous exam.  09/17/2022 CT chest w con: centrilobular emphysema. Scarring in the right apex stable at 3.8x1.4 cm. Several additional tiny nodular densities in both lungs, stable. LLL 6 mm nodule, stable. Tiny 2 mm RUL nodule, resolved. Small cysts in both kidneys. 4 cm lesion posterior left liver, stable. Stable sclerotic lesion left seventh rib and deformity at T5.  02/03/2021: OV with Dr. Craige Cotta.  Treated for AECOPD with prednisone taper and Augmentin.  She has been intolerant of LAMA's in the past.  Advair caused throat irritation and increased cough.  Recommended to try Symbicort with spacer.  08/06/2021: Ov with  NP for follow up. Her daughter is on the phone during the visit and helps to provide  history. Her husband passed away at the beginning of this year and she has been having difficulties with her health since then. They report that over the last 1-2 months, she has had progressive DOE. Was seen by her oncologist and recommended to come back to pulmonary for further management. She did have a CT scan for staging that showed a new area of nodular density measuring 1.3x1.1 cm, concerning for recurrence of disease. They are planning to obtain repeat CT chest in August for 3 month follow up. Today, she reports that her breathing is stable when compared to her scan on 5/10. She continues to have a daily productive cough with clear sputum, which has not increased. She was told by her home health nurse that she has wheezing and they were able to get her an albuterol neb which she uses 1-2 times a day. She was only doing half the solution due to it making her very jittery. She has also noticed worsening swelling in her legs. She has lasix 20 mg daily that she can use as needed for swelling but hasn't noticed much relief. She denies hemoptysis, anorexia, weight loss, fevers, night sweats. She is not on any maintenance therapies - stopped the Symbicort as it made her cough. She does like the breathing treatments, aside from the jitteriness, and feels like this works well for her.   12/01/2021: OV with Dr. Craige Cotta. Has a tremor for about 2 hours after using arformoterol. Hasn't been able to get budesonide recently. Not having chest  congestion, wheeze or pain. Using 2-3 lpm supplemental O2. She has been getting post nasal drip. Clears her throat. Intolerant of LAMAs. Advair caused throat irritation/increased cough. Advised to resume budesonide and switch arformoterol at night to see if this helps mitigate her tremor.   10/18/2022: Today - acute Patient presents today for acute visit. Her daughter is on the phone and helps to provide history. She feels like she's been having more trouble with her breathing over the  last 3 months. She feels like she can't do much without getting out of breath. She did feel better when she first got out of rehab in May but she has been relatively sedentary since then. She's been using her wheelchair more and tends to just stay at the house. Her chest feels more congested but she's not producing any sputum and doesn't feel like she's coughing much more. She had a CT chest in July which did not show any acute process; symptoms unchanged since then. Last two weekends, she's had some chills but she is also being treated for a UTI. She just finished a second course of cipro. Unsure if her urinary symptoms have entirely resolved. She denies any fevers, hemoptysis, increased leg swelling, orthopnea, night sweats, PND. She is being treated for symptomatic anemia and receiving transfusions. Last hgb was 8.6. followed by hem/onc. She has not had any increased oxygen requirements or low O2 levels at home. She does admit that she does not always use her nebulized medications. She sometimes forgets them, even though her daughter calls to remind her. She also just doesn't always feel like doing them. Unable to tolerate inhalers in the past.   Allergies  Allergen Reactions   Antihistamines, Diphenhydramine-Type Other (See Comments)    Able to tolerate only allegra.    Incruse Ellipta [Umeclidinium Bromide] Cough    Caused voice change and severe coughing   Clarithromycin Other (See Comments)    REACTION: diff swallowing and mouth blisters   Codeine Other (See Comments)    Unknown    Diphenhydramine    Pregabalin Other (See Comments)    Lack of effect for neuropathy pain.   Sulfonamide Derivatives    Tiotropium Bromide Monohydrate Other (See Comments)    Voice changes   Umeclidinium    Vraylar [Cariprazine]     intolerant    Immunization History  Administered Date(s) Administered   DT (Pediatric) 12/23/2010   Fluad Quad(high Dose 65+) 12/28/2018, 12/05/2019, 03/16/2022   H1N1  05/30/2008   Influenza Split 12/23/2010, 12/16/2011, 01/08/2013   Influenza Whole 12/07/1995, 12/07/2005, 01/04/2008, 01/06/2010   Influenza, High Dose Seasonal PF 01/06/2016, 01/06/2017, 01/07/2021   Influenza,inj,Quad PF,6+ Mos 12/26/2013, 12/05/2014, 11/28/2017   Influenza-Unspecified 03/16/2022   PFIZER(Purple Top)SARS-COV-2 Vaccination 03/27/2019, 04/18/2019, 01/18/2020   PPD Test 10/02/2014   Pneumococcal Conjugate-13 08/17/2013   Pneumococcal Polysaccharide-23 12/06/2000, 03/08/2005, 10/24/2008, 12/28/2018   Td 03/08/1993, 10/04/2000, 12/23/2010   Tdap 10/01/2016, 04/30/2021   Zoster, Live 12/07/2005    Past Medical History:  Diagnosis Date   Allergy, unspecified not elsewhere classified    Anxiety    Aortic stenosis, mild 03/30/2022   a. TTE 03/29/2022: EF 55-60, no RWMA, GR 1 DD, normal RVSF, mild aortic stenosis (mean 10, V-max 212 cm/s, DI 0.70)   Cataract    Chronic diastolic congestive heart failure (HCC)    a. 06/2022 Echo: EF 60-65%, no rwma, nl RV fxn, mild MR, mild AS.   COPD (chronic obstructive pulmonary disease) (HCC) 03/08/1998   PFTs 12/12/2002 FEV 1 1.42 (  64%) ratio 58 with no better after B2 and DLCO75%; PFTs 11/20/09 FEV1 1.50 (73%) ratio 50 no better after B2 with DLCO 62%; Hfa 50% 11/20/2009 >75%, 01/13/10 p coaching   Coronary artery calcification seen on CT scan    a. 2012 Neg stress test.   Depression 12/07/1998   Diabetes mellitus type II 02/05/2006   Dr. Elvera Lennox with endo   Disorders of bursae and tendons in shoulder region, unspecified    Rotator cuff syndrome, right   E. coli bacteremia    Esophagitis    GERD (gastroesophageal reflux disease)    History of UTI    Hyperlipidemia 10/04/2000   Hypertension 03/08/1992   Hypothyroidism 03/08/1968   Iron deficiency anemia    Lung nodule    radiation starts 10-08-2019   Malignant neoplasm of right upper lobe of lung (HCC) 07/24/2019   Obesity    NOS   OSA (obstructive sleep apnea)    PSG 01/27/10 AHI  13, pt does not know CPAP settings   PAF (paroxysmal atrial fibrillation) (HCC)    a. 06/2022 in setting of UTI/encephalopathy-->converted on amio; b. CHA2DS2VASc = 6-->No OAC 2/2 chronic anemia/fall risk; c. 06/2022 Zio: Predominantly sinus bradycardia at 54 (3-158)'s.  1 brief run of SVT.  2.2% PVC burden.   Peripheral neuropathy    Likely due to DM per Dr. Tresa Endo hernia     Tobacco History: Social History   Tobacco Use  Smoking Status Former   Current packs/day: 0.00   Average packs/day: 2.0 packs/day for 50.0 years (100.0 ttl pk-yrs)   Types: Cigarettes   Start date: 02/06/1955   Quit date: 02/05/2005   Years since quitting: 17.7  Smokeless Tobacco Never   Counseling given: Not Answered   Outpatient Medications Prior to Visit  Medication Sig Dispense Refill   acetaminophen (TYLENOL) 325 MG tablet Take 2 tablets (650 mg total) by mouth every 6 (six) hours as needed for mild pain, headache, fever or moderate pain.     albuterol (PROAIR HFA) 108 (90 Base) MCG/ACT inhaler Inhale 2 puffs into the lungs every 6 (six) hours as needed for wheezing or shortness of breath. INHALE 2 PUFFS INTO THE LUNGS EVERY 6 HOURS AS NEEDED FOR WHEEZING OR SHORTNESS OF BREATH 3 Inhaler 3   amiodarone (PACERONE) 200 MG tablet Take 1 tablet (200 mg total) by mouth daily. 90 tablet 1   arformoterol (BROVANA) 15 MCG/2ML NEBU Take 2 mLs (15 mcg total) by nebulization at bedtime. 120 mL 6   bisoprolol (ZEBETA) 5 MG tablet Take 0.5 tablets (2.5 mg total) by mouth daily. 45 tablet 1   budesonide (PULMICORT) 0.5 MG/2ML nebulizer solution Take 2 mLs (0.5 mg total) by nebulization in the morning and at bedtime. 360 mL 3   busPIRone (BUSPAR) 15 MG tablet Take 15 mg by mouth 3 (three) times daily.     Cholecalciferol (VITAMIN D) 50 MCG (2000 UT) tablet Take 1 tablet (2,000 Units total) by mouth daily.     Coenzyme Q-10 200 MG CAPS Take 200 mg by mouth daily.     Cyanocobalamin (B-12) 5000 MCG CAPS Take 5,000  mcg by mouth daily.     doxycycline (VIBRAMYCIN) 100 MG capsule Take 1 capsule (100 mg total) by mouth 2 (two) times daily. (Patient not taking: Reported on 10/18/2022) 14 capsule 0   esomeprazole (NEXIUM) 40 MG capsule Take 1 capsule (40 mg total) by mouth daily at 12 noon. 30 capsule 11   ezetimibe (ZETIA) 10 MG  tablet TAKE 1 TABLET BY MOUTH EVERYDAY AT BEDTIME 90 tablet 2   ferrous sulfate 325 (65 FE) MG tablet Take 1 tablet (325 mg total) by mouth daily. 90 tablet 3   FLUoxetine (PROZAC) 20 MG tablet Take 20 mg by mouth 2 (two) times daily.     glucose blood (ONETOUCH VERIO) test strip USE AS INSTRUCTED TO CHECK SUGAR 1 TIME DAILY 100 strip 3   guaiFENesin (MUCINEX) 600 MG 12 hr tablet Take 2 tablets (1,200 mg total) by mouth 2 (two) times daily as needed for cough or to loosen phlegm.     isosorbide mononitrate (IMDUR) 30 MG 24 hr tablet Take 30 mg by mouth daily.     levothyroxine (SYNTHROID) 125 MCG tablet Take 1 tablet (125 mcg total) by mouth daily. 90 tablet 3   magnesium 30 MG tablet Take 1 tablet (30 mg total) by mouth 2 (two) times daily. 180 tablet 3   melatonin 5 MG TABS Take 1 tablet (5 mg total) by mouth at bedtime.  0   metFORMIN (GLUCOPHAGE) 500 MG tablet Take 1 tablet (500 mg total) by mouth every evening. 90 tablet 3   nitroGLYCERIN (NITROSTAT) 0.4 MG SL tablet Place 1 tablet (0.4 mg total) under the tongue every 5 (five) minutes as needed for chest pain. 25 tablet 3   nystatin (MYCOSTATIN) 100000 UNIT/ML suspension Take 5 mLs (500,000 Units total) by mouth 4 (four) times daily. 120 mL 0   OneTouch Delica Lancets 33G MISC Use 1x a day 100 each 3   OXYGEN Inhale into the lungs. 2lpm     potassium chloride (KLOR-CON) 10 MEQ tablet Take 1 tablet (10 mEq total) by mouth daily. 90 tablet 3   rosuvastatin (CRESTOR) 10 MG tablet TAKE 1 TABLET BY MOUTH EVERY OTHER DAY 45 tablet 2   Spacer/Aero Chamber Mouthpiece MISC Use with  inhaler as needed.  J44.9 1 each 0   torsemide (DEMADEX)  20 MG tablet Take 2 tablets (40 mg total) by mouth daily. Take an additional 20 mg daily as needed for weight greater then 230 pounds 270 tablet 1   Zinc Oxide (TRIPLE PASTE) 12.8 % ointment Apply 1 Application topically as needed for irritation. Every shift.     No facility-administered medications prior to visit.     Review of Systems:   Constitutional: No weight loss or gain, night sweats, fevers +fatigue (chronic), chills  HEENT: No headaches, difficulty swallowing, tooth/dental problems, or sore throat. No sneezing, itching, ear ache, nasal congestion, or post nasal drip CV:  +swelling in lower extremities (baseline). No chest pain, orthopnea, PND, anasarca, dizziness, palpitations, syncope Resp: +shortness of breath with exertion (progressive); chest congestion; occasional wheezing. No excess mucus or change in color of mucus. No hemoptysis. No wheezing.  No chest wall deformity GI:  No heartburn, indigestion, abdominal pain, nausea, vomiting, diarrhea, change in bowel habits, loss of appetite, bloody stools.  GU: +dysuria. No change in color of urine, urgency or frequency.  No flank pain, no hematuria  Skin: No rash, lesions, ulcerations MSK:  No joint pain or swelling.   Neuro: No dizziness or lightheadedness. +forgetfulness  Psych: No depression or anxiety. Mood stable.     Physical Exam:  BP (!) 108/50   Pulse (!) 54   Ht 5\' 5"  (1.651 m)   Wt 228 lb (103.4 kg)   LMP  (LMP Unknown)   SpO2 90%   BMI 37.94 kg/m   GEN: Pleasant, interactive, chronically-ill appearing; obese; in no acute  distress. HEENT:  Normocephalic and atraumatic. PERRLA. Sclera white. Nasal turbinates pink, moist and patent bilaterally. No rhinorrhea present. Oropharynx pink and moist, without exudate or edema. No lesions, ulcerations, or postnasal drip.  NECK:  Supple w/ fair ROM. No JVD present. Normal carotid impulses w/o bruits. Thyroid symmetrical with no goiter or nodules palpated. No  lymphadenopathy.   CV: RRR, no m/r/g, no peripheral edema. Pulses intact, +2 bilaterally. No cyanosis, pallor or clubbing. PULMONARY:  Unlabored, regular breathing. Diminished bases bilaterally A&P w/o wheezes/rales/rhonchi. No accessory muscle use. No dullness to percussion. GI: BS present and normoactive. Soft, non-tender to palpation. No organomegaly or masses detected. No CVA tenderness. MSK: No erythema, warmth or tenderness. Cap refil <2 sec all extrem. No deformities or joint swelling noted.  Neuro: A/Ox3. No focal deficits noted.   Skin: Warm, no lesions or rashe Psych: Normal affect and behavior. Judgement and thought content appropriate.     Lab Results:  CBC    Component Value Date/Time   WBC 6.5 10/04/2022 1305   WBC 7.2 09/14/2022 1336   RBC 2.35 (L) 10/04/2022 1305   HGB 8.6 (L) 10/12/2022 1312   HGB 10.7 (L) 05/23/2019 1141   HCT 29.2 (L) 10/12/2022 1312   HCT 32.9 (L) 05/23/2019 1141   PLT 162 10/04/2022 1305   PLT 180 05/23/2019 1141   MCV 104.3 (H) 10/04/2022 1305   MCV 95 05/23/2019 1141   MCH 30.6 10/04/2022 1305   MCHC 29.4 (L) 10/04/2022 1305   RDW 15.6 (H) 10/04/2022 1305   RDW 12.8 05/23/2019 1141   LYMPHSABS 0.9 10/04/2022 1305   MONOABS 0.6 10/04/2022 1305   EOSABS 0.1 10/04/2022 1305   BASOSABS 0.0 10/04/2022 1305    BMET    Component Value Date/Time   NA 142 09/15/2022 1418   NA 143 07/08/2022 0000   K 4.0 09/15/2022 1418   CL 98 09/15/2022 1418   CO2 37 (H) 09/15/2022 1418   GLUCOSE 121 (H) 09/15/2022 1418   BUN 35 (H) 09/15/2022 1418   BUN 40 (A) 07/08/2022 0000   CREATININE 1.50 (H) 09/17/2022 1548   CREATININE 1.33 (H) 08/19/2022 1300   CREATININE 0.86 01/25/2020 1450   CALCIUM 8.7 (L) 09/15/2022 1418   GFRNONAA 35 (L) 09/15/2022 1418   GFRNONAA 39 (L) 08/19/2022 1300   GFRAA >60 12/03/2019 1212    BNP    Component Value Date/Time   BNP 245.1 (H) 06/29/2022 1437     Imaging:  No results found.  Transfuse RBC      Date Action Dose Route User   10/07/2022 1135 Rate/Dose Change (none) Intravenous Tomi Likens, RN   10/07/2022 1120 New Bag/Given (none) Intravenous Lelon Perla, [place] H, RN      acetaminophen (TYLENOL) tablet 650 mg     Date Action Dose Route User   10/07/2022 1054 Given 650 mg Oral Jerene Pitch H, RN      epoetin alfa-epbx (RETACRIT) injection 20,000 Units     Date Action Dose Route User   10/05/2022 1521 Given 20,000 Units Subcutaneous (Right Arm) Ashley Royalty A, CMA      iron sucrose (VENOFER) 200 mg in sodium chloride 0.9 % 100 mL IVPB     Date Action Dose Route User   Discharged on 09/23/2022   Admitted on 09/23/2022   08/24/2022 1440 Rate/Dose Change (none) Intravenous Storm Frisk, RN   08/24/2022 1439 New Bag/Given 200 mg Intravenous Chilcott, Lissa Merlin, RN      iron sucrose (VENOFER) 200  mg in sodium chloride 0.9 % 100 mL IVPB     Date Action Dose Route User   Discharged on 09/23/2022   Admitted on 09/23/2022   09/06/2022 1453 Infusion Verify (none) Intravenous Rennie Plowman, RN   09/06/2022 1453 New Bag/Given 200 mg Intravenous Rennie Plowman, RN      iron sucrose (VENOFER) 200 mg in sodium chloride 0.9 % 100 mL IVPB     Date Action Dose Route User   10/04/2022 1613 New Bag/Given 200 mg Intravenous Delena Bali, RN      iron sucrose (VENOFER) 200 mg in sodium chloride 0.9 % 100 mL IVPB     Date Action Dose Route User   10/12/2022 1416 Rate/Dose Change (none) Intravenous Idelia Salm, RN   10/12/2022 1416 New Bag/Given 200 mg Intravenous Easter, Tiffany S, RN      methylPREDNISolone sodium succinate (SOLU-MEDROL) 125 MG injection 20 mg     Date Action Dose Route User   Discharged on 09/23/2022   Admitted on 09/23/2022   08/24/2022 1406 Given 20 mg Intravenous Chilcott, Lissa Merlin, RN      methylPREDNISolone sodium succinate (SOLU-MEDROL) 125 MG injection 20 mg     Date Action Dose Route User   10/04/2022 1539 Given 20 mg Intravenous Delena Bali, RN       methylPREDNISolone sodium succinate (SOLU-MEDROL) 125 mg/2 mL injection 20 mg     Date Action Dose Route User   Discharged on 09/23/2022   Admitted on 09/23/2022   09/06/2022 1418 Given 20 mg Intravenous Rennie Plowman, RN      methylPREDNISolone sodium succinate (SOLU-MEDROL) 125 mg/2 mL injection 20 mg     Date Action Dose Route User   10/12/2022 1358 Given 20 mg Intravenous Easter, Tiffany S, RN      0.9 %  sodium chloride infusion     Date Action Dose Route User   Discharged on 09/23/2022   Admitted on 09/23/2022   08/24/2022 1459 Rate/Dose Change (none) Intravenous Storm Frisk, RN   08/24/2022 1455 Rate/Dose Change (none) Intravenous Storm Frisk, RN   08/24/2022 1438 Infusion Verify (none) Intravenous Storm Frisk, RN   08/24/2022 1408 Infusion Verify (none) Intravenous Storm Frisk, RN   08/24/2022 1408 Restarted (none) Intravenous Chilcott, Lissa Merlin, RN      0.9 %  sodium chloride infusion     Date Action Dose Route User   Discharged on 09/23/2022   Admitted on 09/23/2022   09/06/2022 1512 Rate/Dose Change (none) Intravenous Rennie Plowman, RN   09/06/2022 1508 Rate/Dose Change (none) Intravenous Rennie Plowman, RN   09/06/2022 1508 Restarted (none) Intravenous Rennie Plowman, RN   09/06/2022 1415 New Bag/Given (none) Intravenous Rennie Plowman, RN      0.9 %  sodium chloride infusion     Date Action Dose Route User   10/04/2022 1632 Rate/Dose Change (none) Intravenous Delena Bali, RN   10/04/2022 1631 Rate/Dose Change (none) Intravenous Delena Bali, RN   10/04/2022 1628 Rate/Dose Change (none) Intravenous Delena Bali, RN   10/04/2022 1612 Restarted (none) Intravenous Delena Bali, RN   10/04/2022 1549 Restarted (none) Intravenous Delena Bali, RN      0.9 %  sodium chloride infusion     Date Action Dose Route User   10/12/2022 1437 Rate/Dose Change (none) Intravenous Idelia Salm, RN   10/12/2022 1435 Rate/Dose Change (none) Intravenous Idelia Salm, [place]   10/12/2022 1431 Rate/Dose Change (none) Intravenous Easter, Tiffany  Kathie Rhodes, RN   10/12/2022 1354 Infusion Verify (none) Intravenous Idelia Salm, [place]   10/12/2022 1337 New Bag/Given (none) Intravenous Easter, Tiffany S, RN      0.9 %  sodium chloride infusion (Manually program via Guardrails IV Fluids)     Date Action Dose Route User   10/07/2022 1057 New Bag/Given 250 mL Intravenous Tomi Likens, RN          Latest Ref Rng & Units 08/15/2014    1:36 PM  PFT Results  FVC-Pre L 1.92   FVC-Predicted Pre % 70   FVC-Post L 1.95   FVC-Predicted Post % 71   Pre FEV1/FVC % % 53   Post FEV1/FCV % % 55   FEV1-Pre L 1.01   FEV1-Predicted Pre % 49   FEV1-Post L 1.07   DLCO uncorrected ml/min/mmHg 11.56   DLCO UNC% % 47   DLVA Predicted % 70   TLC L 6.49   TLC % Predicted % 128   RV % Predicted % 188     No results found for: "NITRICOXIDE"      Assessment & Plan:   COPD with chronic bronchitis and emphysema (HCC) Given timeline, lower suspicion for acute exacerbation but will trial her with prednisone burst to see if she has perceived benefit. DOE is likely multifactorial related to anemia, deconditioning, chronic lung disease, and CHF. She also has suboptimal compliance with bronchodilator/ICS, which we reviewed importance of maintenance therapies today. We will see if she has better tolerance of twice daily dosing of LABA now; if not, she will resume once daily dosing. Initiate mucociliary clearance therapies. Action plan in place. Encouraged to work on graded exercises and breathing techniques.   Patient Instructions  Continue levalbuterol (Xopenex) 3 mL every 6 hours as needed for shortness of breath or wheezing.  Continue allegra 180 mg daily as needed for allergies  Continue flonase 2 sprays each nostril daily for allergies/runny nose/nasal congestion Continue arformoterol neb 2 mL. Try increasing to twice a day and see if you can tolerate this. If not, go back to once  daily  Continue budesonide neb 2 mL Twice daily. Brush tongue and rinse mouth afterwards Continue nexium (ezetimibe) 1 tab daily Continue supplemental oxygen 2-3 lpm for goal oxygen 88-90%  Guaifenesin [phone] mg Twice daily for chest congestion Use your flutter valve 10 times an hour after your nebulizer treatments in the morning and evening Trial prednisone 40 mg daily for 5 days. Take in AM with food. Let me know if this helps with your breathing or if you feel worse coming off of it   Follow up with radiation oncology as scheduled.   Follow up with Dr. Para March regarding your UTI symptoms and possible recurrence after coming off the antibiotics   Follow up with Dr. Craige Cotta or Florentina Addison ,NP in 2-3 weeks. If symptoms do not improve or worsen, please contact office for sooner follow up or seek emergency care.   Chronic respiratory failure with hypoxia (HCC) No increased O2 requirement. She is maintained on 2 lpm continuous and 3 lpm POC. Goal >88-90%. She understands to not titrate up her supplemental O2 if saturations in the 90's but breathless and to instead use rescue bronchodilators to avoid worsening hypercarbia.   Obstructive sleep apnea Intolerant CPAP. Continue nocturnal supplemental O2 use.   Malignant neoplasm of right upper lobe of lung (HCC) Stable imaging 09/2022. Follow up with oncology as scheduled  Chronic diastolic CHF (congestive heart failure) (HCC) Euvolemic on exam. Follow  up with cardiology as scheduled  Symptomatic anemia Last infusion 10/12/2022. Hgb 8.6. Follow up with hem/onc as scheduled  UTI (urinary tract infection) Recurrent UTI. Recently finished second course of cipro. Question recurrence of symptoms? Advised she follow up with her PCP for repeat culture and further evaluation.     I spent 42 minutes of dedicated to the care of this patient on the date of this encounter to include pre-visit review of records, face-to-face time with the patient discussing  conditions above, post visit ordering of testing, clinical documentation with the electronic health record, making appropriate referrals as documented, and communicating necessary findings to members of the patients care team.  Noemi Chapel, NP 10/19/2022  Pt aware and understands NP's role.

## 2022-10-19 ENCOUNTER — Encounter: Payer: Self-pay | Admitting: Nurse Practitioner

## 2022-10-19 ENCOUNTER — Inpatient Hospital Stay: Payer: Medicare Other

## 2022-10-19 ENCOUNTER — Inpatient Hospital Stay: Payer: Medicare Other | Admitting: Nurse Practitioner

## 2022-10-19 ENCOUNTER — Telehealth: Payer: Self-pay | Admitting: Family Medicine

## 2022-10-19 VITALS — BP 127/46 | HR 64 | Resp 18

## 2022-10-19 VITALS — BP 143/71 | HR 56 | Temp 97.5°F | Wt 228.0 lb

## 2022-10-19 DIAGNOSIS — E538 Deficiency of other specified B group vitamins: Secondary | ICD-10-CM | POA: Diagnosis not present

## 2022-10-19 DIAGNOSIS — D631 Anemia in chronic kidney disease: Secondary | ICD-10-CM | POA: Diagnosis not present

## 2022-10-19 DIAGNOSIS — D509 Iron deficiency anemia, unspecified: Secondary | ICD-10-CM | POA: Diagnosis not present

## 2022-10-19 DIAGNOSIS — D500 Iron deficiency anemia secondary to blood loss (chronic): Secondary | ICD-10-CM | POA: Diagnosis not present

## 2022-10-19 DIAGNOSIS — N1832 Chronic kidney disease, stage 3b: Secondary | ICD-10-CM

## 2022-10-19 DIAGNOSIS — R300 Dysuria: Secondary | ICD-10-CM

## 2022-10-19 DIAGNOSIS — D649 Anemia, unspecified: Secondary | ICD-10-CM

## 2022-10-19 LAB — CBC WITH DIFFERENTIAL (CANCER CENTER ONLY)
Abs Immature Granulocytes: 0.02 10*3/uL (ref 0.00–0.07)
Basophils Absolute: 0 10*3/uL (ref 0.0–0.1)
Basophils Relative: 0 %
Eosinophils Absolute: 0 10*3/uL (ref 0.0–0.5)
Eosinophils Relative: 0 %
HCT: 29.1 % — ABNORMAL LOW (ref 36.0–46.0)
Hemoglobin: 8.6 g/dL — ABNORMAL LOW (ref 12.0–15.0)
Immature Granulocytes: 0 %
Lymphocytes Relative: 8 %
Lymphs Abs: 0.5 10*3/uL — ABNORMAL LOW (ref 0.7–4.0)
MCH: 31 pg (ref 26.0–34.0)
MCHC: 29.6 g/dL — ABNORMAL LOW (ref 30.0–36.0)
MCV: 105.1 fL — ABNORMAL HIGH (ref 80.0–100.0)
Monocytes Absolute: 0.1 10*3/uL (ref 0.1–1.0)
Monocytes Relative: 1 %
Neutro Abs: 5.9 10*3/uL (ref 1.7–7.7)
Neutrophils Relative %: 91 %
Platelet Count: 141 10*3/uL — ABNORMAL LOW (ref 150–400)
RBC: 2.77 MIL/uL — ABNORMAL LOW (ref 3.87–5.11)
RDW: 15.1 % (ref 11.5–15.5)
WBC Count: 6.5 10*3/uL (ref 4.0–10.5)
nRBC: 0 % (ref 0.0–0.2)

## 2022-10-19 LAB — SAMPLE TO BLOOD BANK

## 2022-10-19 MED ORDER — SODIUM CHLORIDE 0.9 % IV SOLN
200.0000 mg | Freq: Once | INTRAVENOUS | Status: AC
  Administered 2022-10-19: 200 mg via INTRAVENOUS
  Filled 2022-10-19: qty 200

## 2022-10-19 MED ORDER — METHYLPREDNISOLONE SODIUM SUCC 125 MG IJ SOLR: 40.0000 mg | Freq: Once | INTRAMUSCULAR | Status: DC | PRN

## 2022-10-19 MED ORDER — CEPHALEXIN 500 MG PO CAPS: 500.0000 mg | ORAL_CAPSULE | Freq: Four times a day (QID) | ORAL | 0 refills | Status: AC

## 2022-10-19 MED ORDER — METHYLPREDNISOLONE NA SUC (PF) 125 MG IJ SOLR
20.0000 mg | Freq: Once | INTRAMUSCULAR | Status: AC | PRN
  Administered 2022-10-19: 20 mg via INTRAVENOUS

## 2022-10-19 MED ORDER — METHYLPREDNISOLONE SODIUM SUCC 40 MG IJ SOLR: 20.0000 mg | Freq: Once | INTRAMUSCULAR | Status: DC | PRN

## 2022-10-19 MED ORDER — SODIUM CHLORIDE 0.9 % IV SOLN
Freq: Once | INTRAVENOUS | Status: AC
  Filled 2022-10-19: qty 250

## 2022-10-19 NOTE — Telephone Encounter (Signed)
Please call daughter.  I sent keflex rx.  See if she can collect urine sample in sterile container at home (prior to starting abx) to process here.  I put in the ucx order.  If worse in the meantime, then needs OV/eval. Thanks.

## 2022-10-19 NOTE — Assessment & Plan Note (Addendum)
Recurrent UTI. Recently finished second course of cipro. Question recurrence of symptoms? Advised she follow up with her PCP for repeat culture and further evaluation.

## 2022-10-19 NOTE — Assessment & Plan Note (Signed)
Intolerant CPAP. Continue nocturnal supplemental O2 use.

## 2022-10-19 NOTE — Assessment & Plan Note (Signed)
Given timeline, lower suspicion for acute exacerbation but will trial her with prednisone burst to see if she has perceived benefit. DOE is likely multifactorial related to anemia, deconditioning, chronic lung disease, and CHF. She also has suboptimal compliance with bronchodilator/ICS, which we reviewed importance of maintenance therapies today. We will see if she has better tolerance of twice daily dosing of LABA now; if not, she will resume once daily dosing. Initiate mucociliary clearance therapies. Action plan in place. Encouraged to work on graded exercises and breathing techniques.   Patient Instructions  Continue levalbuterol (Xopenex) 3 mL every 6 hours as needed for shortness of breath or wheezing.  Continue allegra 180 mg daily as needed for allergies  Continue flonase 2 sprays each nostril daily for allergies/runny nose/nasal congestion Continue arformoterol neb 2 mL. Try increasing to twice a day and see if you can tolerate this. If not, go back to once daily  Continue budesonide neb 2 mL Twice daily. Brush tongue and rinse mouth afterwards Continue nexium (ezetimibe) 1 tab daily Continue supplemental oxygen 2-3 lpm for goal oxygen 88-90%  Guaifenesin [phone] mg Twice daily for chest congestion Use your flutter valve 10 times an hour after your nebulizer treatments in the morning and evening Trial prednisone 40 mg daily for 5 days. Take in AM with food. Let me know if this helps with your breathing or if you feel worse coming off of it   Follow up with radiation oncology as scheduled.   Follow up with Dr. Para March regarding your UTI symptoms and possible recurrence after coming off the antibiotics   Follow up with Dr. Craige Cotta or Florentina Addison ,NP in 2-3 weeks. If symptoms do not improve or worsen, please contact office for sooner follow up or seek emergency care.

## 2022-10-19 NOTE — Progress Notes (Signed)
[facility] CONSULT NOTE  Patient Care Team: Joaquim Nam, MD as PCP - General Mariah Milling, Tollie Pizza, MD as PCP - Cardiology (Cardiology) Antonieta Iba, MD as Consulting Physician (Cardiology) Oneta Rack, NP as Nurse Practitioner (Adult Health Nurse Practitioner) Earna Coder, MD as Consulting Physician (Oncology)  CHIEF COMPLAINTS/PURPOSE OF CONSULTATION: ANEMIA  HEMATOLOGY HISTORY:  # CHRONIC INTERMITTENT ANEMIA - [since 2010]- Hb SEP 2022- 8; previously on PO Iron- WBC/platelets- Normal; MCV- 90s. EGD- > 15 years; /Colonoscopy:4 years- [Dr.Jacob;; in [place]] capsule-? Bone marrow Biopsy-none; OCT 2022- SEVERE IRON DEFICIENCY; myeloma work-up/hemolysis work-up negative; JAN 2023-CT scan duodenal thickening-recommend GI evaluation  #   AUG 2021- RIGHT LUNG stage I non-small cell lung cancer-  s/p SBRT  [Dr.Moody; [place]]; NOV 2022- CT chest-no recurrent disease.  # COPD on 2.5 lit/day [Dr.Sood]; diastolic CHF/ OCT 2022- Low BP [needing to come off losartan- Dr.Gollan]; peripheral neuropathy.  HISTORY OF PRESENTING ILLNESS: Alone; 3 Lit/ O2 24 x 7. In wheel chair.  Caregiver, Elease Hashimoto, in lobby.   Alexandria [name] 87 y.o. female with chronic respiratory failure, on oxygen, and severe iron deficiency anemia of unclear etiology- likely GI losses; unable to go through workup, who returns to clinic for follow up and consideration of IV iron.  She previously received IV venofer on 7/29 and 8/6. Started EPO on 10/05/22.   She has ongoing shortness of breath over past few months, saw pulmonology yesterday. Continues to be treated for UTI by pcp. She saw GI in June and declined capsule endoscopy as she was felt to not be a candidate for procedures and has elected for palliation of her anemia.    Review of Systems  Constitutional:  Positive for malaise/fatigue. Negative for chills, fever and weight loss.  HENT:  Negative for nosebleeds.   Respiratory:  Positive  for shortness of breath. Negative for cough, hemoptysis and wheezing.   Cardiovascular:  Negative for chest pain, palpitations and leg swelling.  Gastrointestinal:  Negative for abdominal pain, blood in stool, constipation, diarrhea, melena, nausea and vomiting.  Genitourinary:  Negative for dysuria, hematuria and urgency.  Musculoskeletal:  Positive for back pain and joint pain. Negative for falls, myalgias and neck pain.  Skin:  Negative for itching and rash.  Neurological:  Positive for weakness. Negative for dizziness, tingling, sensory change, loss of consciousness and headaches.  Endo/Heme/Allergies:  Negative for environmental allergies. Does not bruise/bleed easily.  Psychiatric/Behavioral:  Negative for depression. The patient is not nervous/anxious and does not have insomnia.     MEDICAL HISTORY:  Past Medical History:  Diagnosis Date   Allergy, unspecified not elsewhere classified    Anxiety    Aortic stenosis, mild 03/30/2022   a. TTE 03/29/2022: EF 55-60, no RWMA, GR 1 DD, normal RVSF, mild aortic stenosis (mean 10, V-max 212 cm/s, DI 0.70)   Cataract    Chronic diastolic congestive heart failure (HCC)    a. 06/2022 Echo: EF 60-65%, no rwma, nl RV fxn, mild MR, mild AS.   COPD (chronic obstructive pulmonary disease) (HCC) 03/08/1998   PFTs 12/12/2002 FEV 1 1.42 (64%) ratio 58 with no better after B2 and DLCO75%; PFTs 11/20/09 FEV1 1.50 (73%) ratio 50 no better after B2 with DLCO 62%; Hfa 50% 11/20/2009 >75%, 01/13/10 p coaching   Coronary artery calcification seen on CT scan    a. 2012 Neg stress test.   Depression 12/07/1998   Diabetes mellitus type II 02/05/2006   Dr. Elvera Lennox with endo   Disorders  of bursae and tendons in shoulder region, unspecified    Rotator cuff syndrome, right   E. coli bacteremia    Esophagitis    GERD (gastroesophageal reflux disease)    History of UTI    Hyperlipidemia 10/04/2000   Hypertension 03/08/1992   Hypothyroidism 03/08/1968   Iron  deficiency anemia    Lung nodule    radiation starts 10-08-2019   Malignant neoplasm of right upper lobe of lung (HCC) 07/24/2019   Obesity    NOS   OSA (obstructive sleep apnea)    PSG 01/27/10 AHI 13, pt does not know CPAP settings   PAF (paroxysmal atrial fibrillation) (HCC)    a. 06/2022 in setting of UTI/encephalopathy-->converted on amio; b. CHA2DS2VASc = 6-->No OAC 2/2 chronic anemia/fall risk; c. 06/2022 Zio: Predominantly sinus bradycardia at 54 (3-158)'s.  1 brief run of SVT.  2.2% PVC burden.   Peripheral neuropathy    Likely due to DM per Dr. Tresa Endo hernia     SURGICAL HISTORY: Past Surgical History:  Procedure Laterality Date   ABD U/S  03/19/1999   Nml x2 foci in liver   ADENOSINE MYOVIEW  06/02/2007   Nml   CARDIOLITE PERSANTINE  08/24/2000   Nml   CAROTID U/S  08/24/2000   1-39% ICA stenosis   CAROTID U/S  06/02/2007   No apprec change    CARPAL TUNNEL RELEASE  12/1997   Right   CESAREAN SECTION     x2 Breech/ repeat   CHOLECYSTECTOMY  1997   COLONOSCOPY WITH PROPOFOL N/A 02/12/2016   Procedure: COLONOSCOPY WITH PROPOFOL;  Surgeon: Rachael Fee, MD;  Location: [facility];  Service: Endoscopy;  Laterality: N/A;   CT ABD W & PELVIS WO/W CM     Abd hemangiomas of liver, 1 cm R renal cyst   DENTAL SURGERY  2016   Implants   DEXA  07/03/2003   Nml   ESOPHAGOGASTRODUODENOSCOPY  12/05/1997   Nml (due to hoarseness)   ESOPHAGOGASTRODUODENOSCOPY (EGD) WITH PROPOFOL N/A 02/12/2016   Procedure: ESOPHAGOGASTRODUODENOSCOPY (EGD) WITH PROPOFOL;  Surgeon: Rachael Fee, MD;  Location: [facility];  Service: Endoscopy;  Laterality: N/A;   GALLBLADDER SURGERY     HERNIA REPAIR  01/24/2009   Lap Ventr w/ Lysis of adhesions (Dr. Dwain Sarna)   knee arthroscopic surgery  years ago   right   ROTATOR CUFF REPAIR  1984   Right, [place]   SHOULDER OPEN ROTATOR CUFF REPAIR  02/08/2012   Procedure: ROTATOR CUFF REPAIR SHOULDER OPEN;  Surgeon: Drucilla Schmidt,  MD;  Location: [facility];  Service: Orthopedics;  Laterality: Left;  Left Shoulder Open Anterior Acrominectomy Rotator Cuff Repair Open Distal Clavicle Resection ,tissue mend graft, and repair of biceps tendon   THUMB RELEASE  12/1997   Right   TONSILLECTOMY     TOTAL ABDOMINAL HYSTERECTOMY  1985   Due to dysmennorhea   US ECHOCARDIOGRAPHY  06/02/2007    SOCIAL HISTORY: Social History   Socioeconomic History   Marital status: Widowed    Spouse name: Not on file   Number of children: 2   Years of education: Not on file   Highest education level: Not on file  Occupational History   Occupation: Retired    Associate Professor: RETIRED  Tobacco Use   Smoking status: Former    Current packs/day: 0.00    Average packs/day: 2.0 packs/day for 50.0 years (100.0 ttl pk-yrs)    Types: Cigarettes    Start date: 02/06/1955  Quit date: 02/05/2005    Years since quitting: 17.7   Smokeless tobacco: Never  Vaping Use   Vaping status: Never Used  Substance and Sexual Activity   Alcohol use: Yes    Comment: occasionally   Drug use: Never   Sexual activity: Not Currently  Other Topics Concern   Not on file  Social History Narrative   Widowed 2023, 2 children; Enjoys painting      Quit smoking in 2007; no alcohol; lives in [place]; [name]/d lives in [place]. Had own business. Dont drive- sec to neuropathy.    Social Determinants of Health   Financial Resource Strain: Not on file  Food Insecurity: No Food Insecurity (09/27/2022)   Hunger Vital Sign    Worried About Running Out of Food in the Last Year: Never true    Ran Out of Food in the Last Year: Never true  Transportation Needs: No Transportation Needs (09/27/2022)   PRAPARE - Administrator, Civil Service (Medical): No    Lack of Transportation (Non-Medical): No  Physical Activity: Not on file  Stress: Not on file  Social Connections: Not on file  Intimate Partner Violence: Not At Risk (09/27/2022)   Humiliation, Afraid, Rape,  and Kick questionnaire    Fear of Current or Ex-Partner: No    Emotionally Abused: No    Physically Abused: No    Sexually Abused: No    FAMILY HISTORY: Family History  Problem Relation Age of Onset   Heart disease Mother    Thyroid disease Mother    Emphysema Father        One lung   Cystic fibrosis Sister    Hyperthyroidism Sister    Osteoarthritis Brother    Hyperthyroidism Brother    Esophageal cancer Neg Hx    Cancer Neg Hx        Head or neck   Colon cancer Neg Hx    Stomach cancer Neg Hx    Breast cancer Neg Hx     ALLERGIES:  is allergic to antihistamines, diphenhydramine-type; incruse ellipta [umeclidinium bromide]; clarithromycin; codeine; diphenhydramine; pregabalin; sulfonamide derivatives; tiotropium bromide monohydrate; umeclidinium; and vraylar [cariprazine].  MEDICATIONS:  Current Outpatient Medications  Medication Sig Dispense Refill   acetaminophen (TYLENOL) 325 MG tablet Take 2 tablets (650 mg total) by mouth every 6 (six) hours as needed for mild pain, headache, fever or moderate pain.     albuterol (PROAIR HFA) 108 (90 Base) MCG/ACT inhaler Inhale 2 puffs into the lungs every 6 (six) hours as needed for wheezing or shortness of breath. INHALE 2 PUFFS INTO THE LUNGS EVERY 6 HOURS AS NEEDED FOR WHEEZING OR SHORTNESS OF BREATH 3 Inhaler 3   amiodarone (PACERONE) 200 MG tablet Take 1 tablet (200 mg total) by mouth daily. 90 tablet 1   arformoterol (BROVANA) 15 MCG/2ML NEBU Take 2 mLs (15 mcg total) by nebulization at bedtime. 120 mL 6   bisoprolol (ZEBETA) 5 MG tablet Take 0.5 tablets (2.5 mg total) by mouth daily. 45 tablet 1   budesonide (PULMICORT) 0.5 MG/2ML nebulizer solution Take 2 mLs (0.5 mg total) by nebulization in the morning and at bedtime. 360 mL 3   busPIRone (BUSPAR) 15 MG tablet Take 15 mg by mouth 3 (three) times daily.     Cholecalciferol (VITAMIN D) 50 MCG (2000 UT) tablet Take 1 tablet (2,000 Units total) by mouth daily.     Coenzyme Q-10  200 MG CAPS Take 200 mg by mouth daily.  Cyanocobalamin (B-12) 5000 MCG CAPS Take 5,000 mcg by mouth daily.     doxycycline (VIBRAMYCIN) 100 MG capsule Take 1 capsule (100 mg total) by mouth 2 (two) times daily. (Patient not taking: Reported on 10/18/2022) 14 capsule 0   esomeprazole (NEXIUM) 40 MG capsule Take 1 capsule (40 mg total) by mouth daily at 12 noon. 30 capsule 11   ezetimibe (ZETIA) 10 MG tablet TAKE 1 TABLET BY MOUTH EVERYDAY AT BEDTIME 90 tablet 2   ferrous sulfate 325 (65 FE) MG tablet Take 1 tablet (325 mg total) by mouth daily. 90 tablet 3   FLUoxetine (PROZAC) 20 MG tablet Take 20 mg by mouth 2 (two) times daily.     glucose blood (ONETOUCH VERIO) test strip USE AS INSTRUCTED TO CHECK SUGAR 1 TIME DAILY 100 strip 3   guaiFENesin (MUCINEX) 600 MG 12 hr tablet Take 2 tablets (1,200 mg total) by mouth 2 (two) times daily as needed for cough or to loosen phlegm.     isosorbide mononitrate (IMDUR) 30 MG 24 hr tablet Take 30 mg by mouth daily.     levothyroxine (SYNTHROID) 125 MCG tablet Take 1 tablet (125 mcg total) by mouth daily. 90 tablet 3   magnesium 30 MG tablet Take 1 tablet (30 mg total) by mouth 2 (two) times daily. 180 tablet 3   melatonin 5 MG TABS Take 1 tablet (5 mg total) by mouth at bedtime.  0   metFORMIN (GLUCOPHAGE) 500 MG tablet Take 1 tablet (500 mg total) by mouth every evening. 90 tablet 3   nitroGLYCERIN (NITROSTAT) 0.4 MG SL tablet Place 1 tablet (0.4 mg total) under the tongue every 5 (five) minutes as needed for chest pain. 25 tablet 3   nystatin (MYCOSTATIN) 100000 UNIT/ML suspension Take 5 mLs (500,000 Units total) by mouth 4 (four) times daily. 120 mL 0   OneTouch Delica Lancets 33G MISC Use 1x a day 100 each 3   OXYGEN Inhale into the lungs. 2lpm     potassium chloride (KLOR-CON) 10 MEQ tablet Take 1 tablet (10 mEq total) by mouth daily. 90 tablet 3   predniSONE (DELTASONE) 20 MG tablet Take 2 tablets (40 mg total) by mouth daily with breakfast for 5  days. 10 tablet 0   rosuvastatin (CRESTOR) 10 MG tablet TAKE 1 TABLET BY MOUTH EVERY OTHER DAY 45 tablet 2   Spacer/Aero Chamber Mouthpiece MISC Use with  inhaler as needed.  J44.9 1 each 0   torsemide (DEMADEX) 20 MG tablet Take 2 tablets (40 mg total) by mouth daily. Take an additional 20 mg daily as needed for weight greater then 230 pounds 270 tablet 1   Zinc Oxide (TRIPLE PASTE) 12.8 % ointment Apply 1 Application topically as needed for irritation. Every shift.     No current facility-administered medications for this visit.    PHYSICAL EXAMINATION: Vitals:   10/19/22 1334  BP: (!) 143/71  Pulse: (!) 56  Temp: (!) 97.5 F (36.4 C)  SpO2: 94%    Filed Weights   10/19/22 1334  Weight: 228 lb (103.4 kg)    Physical Exam Vitals reviewed.  Constitutional:      Appearance: She is morbidly obese. She is not ill-appearing.     Interventions: Nasal cannula in place.     Comments: Unaccompanied. Appears more alert.   Cardiovascular:     Rate and Rhythm: Normal rate and regular rhythm.     Pulses: Normal pulses.  Pulmonary:     Effort: No respiratory  distress.     Comments: Decreased breath sounds bilaterally.  Scattered wheezing. On oxygen via [place]- 3L. Short of breath with exertion.  Abdominal:     General: There is no distension.     Palpations: Abdomen is soft.     Tenderness: There is no abdominal tenderness.  Skin:    Coloration: Skin is not pale.  Neurological:     Mental Status: She is alert and oriented to person, place, and time.     Motor: No weakness.  Psychiatric:        Mood and Affect: Mood normal.        Behavior: Behavior normal.     LABORATORY DATA:  I have reviewed the data as listed Lab Results  Component Value Date   WBC 6.5 10/19/2022   HGB 8.6 (L) 10/19/2022   HCT 29.1 (L) 10/19/2022   MCV 105.1 (H) 10/19/2022   PLT 141 (L) 10/19/2022   Recent Labs    06/29/22 1302 06/29/22 1437 06/30/22 0631 07/08/22 0000 08/03/22 1234 08/09/22 1305  08/19/22 1300 09/15/22 1418 09/17/22 1548  NA 134* 136   < > 143   < > 141 143 142  --   K 4.6 4.6   < > 3.9   < > 4.6 4.5 4.0  --   CL 94* 95*   < > 101   < > 102 99 98  --   CO2 33* 38*   < > 39*   < > 31 36* 37*  --   GLUCOSE 174* 130*   < >  --    < > 108* 118* 121*  --   BUN 33* 33*   < > 40*   < > 26* 33* 35*  --   CREATININE 1.38* 1.36*   < > 1.2*   < > 1.22* 1.33* 1.46* 1.50*  CALCIUM 7.8* 7.8*   < > 8.7   < > 8.2* 8.7* 8.7*  --   GFRNONAA 38* 38*   < >  --   --  44* 39* 35*  --   PROT 6.5 6.3*  --   --   --  6.4*  --   --   --   ALBUMIN 2.9* 2.9*  --  3.2*  --  3.2*  --   --   --   AST 16 16  --  14  --  15  --   --   --   ALT 15 16  --  22  --  15  --   --   --   ALKPHOS 104 100  --  78  --  73  --   --   --   BILITOT 0.5 0.8  --   --   --  0.3  --   --   --    < > = values in this interval not displayed.   Iron/TIBC/Ferritin/ %Sat    Component Value Date/Time   IRON 46 10/04/2022 1305   IRON 52 07/22/2022 0000   TIBC 391 10/04/2022 1305   FERRITIN 42 10/04/2022 1305   IRONPCTSAT 12 10/04/2022 1305     No results found.   Assessment & Plan:   Symptomatic Anemia- 12/2020 ferritin 7. Symptomatic and chronic intermittent since 2010. S/p IV Venofer. April 2024, anemia worsening despite iron infusions. Iron sat 10%, ferritin 26. Question other etiologies including blood loss vs primary bone marrow disorders- additional workup was declined and she elected for symptomatic care.  Hemoglobin baseline around 9-10. Dropped to 8.5 in February 2024 --> 7.2 (06/2022) -->> 6.4 (06/23/22). She received 2 units of pRBCs.- hmg worsened to 7.2- received 1 unit pRBCs. Hmg today improved to 8.6. Hold transfusion. Patient does have positive antibody screen and requires increased time to obtain blood product.  Iron Deficiency Anemia- increased iron requirement with drops in hemoglobin despite iron and previous transfusions- likely indicative of GI losses (see below). Hemoglobin today is stable  at 8.6 over past week and overall slightly improved. Plan for venofer today. She will likely need weekly to every other week venofer. Repeat iron studies in 1 week.  Thrombocytopenia- plt 141. Monitor.  Previous allergic reaction- she has been tolerating venofer well but previously experienced possible allergic reaction and has been premedicated with solumedrol.  Etiology of Iron Deficiency- unclear- CT 03/18/21 revealed mild wall thickening of the proximal duodenum, at least in part d/t under distension, possibly reflective of PUD/duodenitis, s/p eval with Dr Christella Hartigan in [place]/ GI in June 2023. Per patient, she was not a candidate for upper and lower endoscopies. Last EGD 02/2016 revealed small hiatal hernia. Colonoscopy with sigmoid diverticulosis, lipoma of sigmoid colon, internal hemorrhoids. Likely continued blood loss. Last evaluated by GI in June 2024. Declined interventions and workup given her medical comorbidities. Daughter and patient wish to proceed with supportive care.    CKD- stage IIIb. GFR 44. Started EPO/retacrit 20,000 units. Again reviewed how CKD can contribute to anemia and role of EPO injections. Tolerating retacrit well. Continue to optimize iron stores. Plan for retacrit every 2 weeks for hemoglobin < 10.  B12 deficiency- 472 today. Decreased but normal. Monitor. Recheck at next visit.  Stage I Lung Cancer- Aug 2021 right lung, NSCLC. S/p SBRT with Dr. Mitzi Hansen in [place]. 03/2022 CT negative for recurrent disease.  Diastolic chf- on home oxygen. Managed by cardiology.  Chest pain- on nitroglycerin. No recurrent episodes since improvement in hemoglobin. Encouraged follow up with cardiology.  COPD & Chronic respiratory failure - saw pulmonology yesterday Metabolic encephalopathy- s/p hospitalization. D/t UTI. Discharged to rehab, now home.    UTI- 70,000 colonies of proteus on recent culture. On cephalexin. Managed by PCP.  Toenail avulsion & diabetic toenails- s/p referral and  evaluation with podiatry.  Goals of care: We previously discussed potentially life limiting illness of anemia, bleeding. Patient and daughter verbalize desire for CPR however, patient would not want to be on life support. She confirms that today. I provided her with MOST form and advance directives forms today which she can review and complete with her daughter then return to clinic to be scanned in. Also offered virtual palliative care visit if assistance is needed.   Appts after 1 pm.  Disposition:  Venofer today 1 week- lab (cbc), +/- retacrit 2 weeks- venofer 3 weeks- lab (cbc), +/- retacrit 4 weeks- lab (cbc, ferritin, iron studies, b12), see me, +/- venofer- la  No problem-specific Assessment & Plan notes found for this encounter.  All questions were answered. The patient knows to call the clinic with any problems, questions or concerns.   Alinda Dooms, NP 10/19/2022   CC: Dr Tomasa Rand, Dr Donneta Romberg, Dr. Para March

## 2022-10-19 NOTE — Progress Notes (Signed)
Reviewed and agree with assessment/plan.   Coralyn Helling, MD [facility] Pulmonary/Critical Care 10/19/2022, 10:33 AM Pager:  [phone]

## 2022-10-19 NOTE — Assessment & Plan Note (Signed)
No increased O2 requirement. She is maintained on 2 lpm continuous and 3 lpm POC. Goal >88-90%. She understands to not titrate up her supplemental O2 if saturations in the 90's but breathless and to instead use rescue bronchodilators to avoid worsening hypercarbia.

## 2022-10-19 NOTE — Assessment & Plan Note (Signed)
Euvolemic on exam. Follow up with cardiology as scheduled

## 2022-10-19 NOTE — Assessment & Plan Note (Signed)
Last infusion 10/12/2022. Hgb 8.6. Follow up with hem/onc as scheduled

## 2022-10-19 NOTE — Assessment & Plan Note (Signed)
Stable imaging 09/2022. Follow up with oncology as scheduled

## 2022-10-19 NOTE — Telephone Encounter (Signed)
Called and spoke to daughter. I am leaving a urine kit by the front door. Daughter informed that she will need to put name and date of birth on cup. She will have someone come by and pick up. Reviewed storage instructions if she is not bringing the sample right over and had her repeat back to me. Will make sure that we get before medications are started.

## 2022-10-20 ENCOUNTER — Other Ambulatory Visit: Payer: Self-pay | Admitting: Radiology

## 2022-10-20 ENCOUNTER — Encounter: Payer: Self-pay | Admitting: Nurse Practitioner

## 2022-10-20 DIAGNOSIS — I110 Hypertensive heart disease with heart failure: Secondary | ICD-10-CM | POA: Diagnosis not present

## 2022-10-20 DIAGNOSIS — J9621 Acute and chronic respiratory failure with hypoxia: Secondary | ICD-10-CM | POA: Diagnosis not present

## 2022-10-20 DIAGNOSIS — I5033 Acute on chronic diastolic (congestive) heart failure: Secondary | ICD-10-CM | POA: Diagnosis not present

## 2022-10-20 DIAGNOSIS — R300 Dysuria: Secondary | ICD-10-CM | POA: Diagnosis not present

## 2022-10-20 DIAGNOSIS — J9622 Acute and chronic respiratory failure with hypercapnia: Secondary | ICD-10-CM | POA: Diagnosis not present

## 2022-10-20 DIAGNOSIS — E1142 Type 2 diabetes mellitus with diabetic polyneuropathy: Secondary | ICD-10-CM | POA: Diagnosis not present

## 2022-10-20 DIAGNOSIS — I480 Paroxysmal atrial fibrillation: Secondary | ICD-10-CM | POA: Diagnosis not present

## 2022-10-20 NOTE — Telephone Encounter (Signed)
Noted. Thanks.

## 2022-10-21 ENCOUNTER — Telehealth: Payer: Self-pay | Admitting: Nurse Practitioner

## 2022-10-21 ENCOUNTER — Ambulatory Visit (INDEPENDENT_AMBULATORY_CARE_PROVIDER_SITE_OTHER): Payer: Medicare Other

## 2022-10-21 VITALS — Ht 65.0 in | Wt 227.0 lb

## 2022-10-21 DIAGNOSIS — J9622 Acute and chronic respiratory failure with hypercapnia: Secondary | ICD-10-CM | POA: Diagnosis not present

## 2022-10-21 DIAGNOSIS — D649 Anemia, unspecified: Secondary | ICD-10-CM

## 2022-10-21 DIAGNOSIS — Z0000 Encounter for general adult medical examination without abnormal findings: Secondary | ICD-10-CM | POA: Diagnosis not present

## 2022-10-21 DIAGNOSIS — I480 Paroxysmal atrial fibrillation: Secondary | ICD-10-CM | POA: Diagnosis not present

## 2022-10-21 DIAGNOSIS — I110 Hypertensive heart disease with heart failure: Secondary | ICD-10-CM | POA: Diagnosis not present

## 2022-10-21 DIAGNOSIS — I5033 Acute on chronic diastolic (congestive) heart failure: Secondary | ICD-10-CM | POA: Diagnosis not present

## 2022-10-21 DIAGNOSIS — J9621 Acute and chronic respiratory failure with hypoxia: Secondary | ICD-10-CM | POA: Diagnosis not present

## 2022-10-21 DIAGNOSIS — E1142 Type 2 diabetes mellitus with diabetic polyneuropathy: Secondary | ICD-10-CM | POA: Diagnosis not present

## 2022-10-21 LAB — URINE CULTURE
MICRO NUMBER:: 15329992
SPECIMEN QUALITY:: ADEQUATE

## 2022-10-21 NOTE — Telephone Encounter (Signed)
Called patient's daughter at her request. No answer. Left vm.

## 2022-10-21 NOTE — Patient Instructions (Signed)
[name] , Thank you for taking time to come for your Medicare Wellness Visit. I appreciate your ongoing commitment to your health goals. Please review the following plan we discussed and let me know if I can assist you in the future.   Referrals/Orders/Follow-Ups/Clinician Recommendations: Aim for 30 minutes of exercise or brisk walking, 6-8 glasses of water, and 5 servings of fruits and vegetables each day.   This is a list of the screening recommended for you and due dates:  Health Maintenance  Topic Date Due   Zoster (Shingles) Vaccine (1 of 2) 09/02/1954   Yearly kidney health urinalysis for diabetes  01/12/2008   Eye exam for diabetics  06/24/2022   Hemoglobin A1C  09/14/2022   Flu Shot  10/07/2022   Complete foot exam   08/26/2023   Yearly kidney function blood test for diabetes  09/15/2023   Medicare Annual Wellness Visit  09/17/2023   DTaP/Tdap/Td vaccine (7 - Td or Tdap) 05/01/2031   Pneumonia Vaccine  Completed   DEXA scan (bone density measurement)  Completed   HPV Vaccine  Aged Out   COVID-19 Vaccine  Discontinued    Advanced directives: (In Chart) A copy of your advanced directives are scanned into your chart should your provider ever need it.  Next Medicare Annual Wellness Visit scheduled for next year: Yes  Preventive Care 11 Years and Older, Female Preventive care refers to lifestyle choices and visits with your health care provider that can promote health and wellness. What does preventive care include? A yearly physical exam. This is also called an annual well check. Dental exams once or twice a year. Routine eye exams. Ask your health care provider how often you should have your eyes checked. Personal lifestyle choices, including: Daily care of your teeth and gums. Regular physical activity. Eating a healthy diet. Avoiding tobacco and drug use. Limiting alcohol use. Practicing safe sex. Taking low-dose aspirin every day. Taking vitamin and mineral  supplements as recommended by your health care provider. What happens during an annual well check? The services and screenings done by your health care provider during your annual well check will depend on your age, overall health, lifestyle risk factors, and family history of disease. Counseling  Your health care provider may ask you questions about your: Alcohol use. Tobacco use. Drug use. Emotional well-being. Home and relationship well-being. Sexual activity. Eating habits. History of falls. Memory and ability to understand (cognition). Work and work Astronomer. Reproductive health. Screening  You may have the following tests or measurements: Height, weight, and BMI. Blood pressure. Lipid and cholesterol levels. These may be checked every 5 years, or more frequently if you are over 61 years old. Skin check. Lung cancer screening. You may have this screening every year starting at age 39 if you have a 30-pack-year history of smoking and currently smoke or have quit within the past 15 years. Fecal occult blood test (FOBT) of the stool. You may have this test every year starting at age 79. Flexible sigmoidoscopy or colonoscopy. You may have a sigmoidoscopy every 5 years or a colonoscopy every 10 years starting at age 25. Hepatitis C blood test. Hepatitis B blood test. Sexually transmitted disease (STD) testing. Diabetes screening. This is done by checking your blood sugar (glucose) after you have not eaten for a while (fasting). You may have this done every 1-3 years. Bone density scan. This is done to screen for osteoporosis. You may have this done starting at age 47. Mammogram. This may  be done every 1-2 years. Talk to your health care provider about how often you should have regular mammograms. Talk with your health care provider about your test results, treatment options, and if necessary, the need for more tests. Vaccines  Your health care provider may recommend certain  vaccines, such as: Influenza vaccine. This is recommended every year. Tetanus, diphtheria, and acellular pertussis (Tdap, Td) vaccine. You may need a Td booster every 10 years. Zoster vaccine. You may need this after age 30. Pneumococcal 13-valent conjugate (PCV13) vaccine. One dose is recommended after age 28. Pneumococcal polysaccharide (PPSV23) vaccine. One dose is recommended after age 21. Talk to your health care provider about which screenings and vaccines you need and how often you need them. This information is not intended to replace advice given to you by your health care provider. Make sure you discuss any questions you have with your health care provider. Document Released: 03/21/2015 Document Revised: 11/12/2015 Document Reviewed: 12/24/2014 Elsevier Interactive Patient Education  2017 [employer].  Fall Prevention in the Home Falls can cause injuries. They can happen to people of all ages. There are many things you can do to make your home safe and to help prevent falls. What can I do on the outside of my home? Regularly fix the edges of walkways and driveways and fix any cracks. Remove anything that might make you trip as you walk through a door, such as a raised step or threshold. Trim any bushes or trees on the path to your home. Use bright outdoor lighting. Clear any walking paths of anything that might make someone trip, such as rocks or tools. Regularly check to see if handrails are loose or broken. Make sure that both sides of any steps have handrails. Any raised decks and porches should have guardrails on the edges. Have any leaves, snow, or ice cleared regularly. Use sand or salt on walking paths during winter. Clean up any spills in your garage right away. This includes oil or grease spills. What can I do in the bathroom? Use night lights. Install grab bars by the toilet and in the tub and shower. Do not use towel bars as grab bars. Use non-skid mats or decals in  the tub or shower. If you need to sit down in the shower, use a plastic, non-slip stool. Keep the floor dry. Clean up any water that spills on the floor as soon as it happens. Remove soap buildup in the tub or shower regularly. Attach bath mats securely with double-sided non-slip rug tape. Do not have throw rugs and other things on the floor that can make you trip. What can I do in the bedroom? Use night lights. Make sure that you have a light by your bed that is easy to reach. Do not use any sheets or blankets that are too big for your bed. They should not hang down onto the floor. Have a firm chair that has side arms. You can use this for support while you get dressed. Do not have throw rugs and other things on the floor that can make you trip. What can I do in the kitchen? Clean up any spills right away. Avoid walking on wet floors. Keep items that you use a lot in easy-to-reach places. If you need to reach something above you, use a strong step stool that has a grab bar. Keep electrical cords out of the way. Do not use floor polish or wax that makes floors slippery. If you must use  wax, use non-skid floor wax. Do not have throw rugs and other things on the floor that can make you trip. What can I do with my stairs? Do not leave any items on the stairs. Make sure that there are handrails on both sides of the stairs and use them. Fix handrails that are broken or loose. Make sure that handrails are as long as the stairways. Check any carpeting to make sure that it is firmly attached to the stairs. Fix any carpet that is loose or worn. Avoid having throw rugs at the top or bottom of the stairs. If you do have throw rugs, attach them to the floor with carpet tape. Make sure that you have a light switch at the top of the stairs and the bottom of the stairs. If you do not have them, ask someone to add them for you. What else can I do to help prevent falls? Wear shoes that: Do not have high  heels. Have rubber bottoms. Are comfortable and fit you well. Are closed at the toe. Do not wear sandals. If you use a stepladder: Make sure that it is fully opened. Do not climb a closed stepladder. Make sure that both sides of the stepladder are locked into place. Ask someone to hold it for you, if possible. Clearly mark and make sure that you can see: Any grab bars or handrails. First and last steps. Where the edge of each step is. Use tools that help you move around (mobility aids) if they are needed. These include: Canes. Walkers. Scooters. Crutches. Turn on the lights when you go into a dark area. Replace any light bulbs as soon as they burn out. Set up your furniture so you have a clear path. Avoid moving your furniture around. If any of your floors are uneven, fix them. If there are any pets around you, be aware of where they are. Review your medicines with your doctor. Some medicines can make you feel dizzy. This can increase your chance of falling. Ask your doctor what other things that you can do to help prevent falls. This information is not intended to replace advice given to you by your health care provider. Make sure you discuss any questions you have with your health care provider. Document Released: 12/19/2008 Document Revised: 07/31/2015 Document Reviewed: 03/29/2014 Elsevier Interactive Patient Education  2017 [employer].

## 2022-10-21 NOTE — Progress Notes (Signed)
Subjective:   Alexandria [name] is a 87 y.o. female who presents for Medicare Annual (Subsequent) preventive examination.  Visit Complete: Virtual  I connected with  Olene Craven on 10/21/22 by a audio enabled telemedicine application and verified that I am speaking with the correct person using two identifiers.  Patient Location: Home  Provider Location: Home Office  I discussed the limitations of evaluation and management by telemedicine. The patient  Vital Signs: Because this visit was a virtual/telehealth visit, some criteria may be missing or patient reported. Any vitals not documented were not able to be obtained and vitals that have been documented are patient reported.  expressed understanding and agreed to proceed.   Review of Systems      Cardiac Risk Factors include: advanced age (>23men, >56 women);hypertension;diabetes mellitus;dyslipidemia;sedentary lifestyle;smoking/ tobacco exposure     Objective:    Today's Vitals   10/21/22 1304  Weight: 227 lb (103 kg)  Height: 5\' 5"  (1.651 m)  PainSc: 8    Body mass index is 37.77 kg/m.     10/21/2022    1:25 PM 10/19/2022    1:31 PM 10/04/2022    1:21 PM 09/27/2022   11:25 AM 09/06/2022    1:13 PM 08/24/2022    1:51 PM 08/13/2022    3:29 PM  Advanced Directives  Does Patient Have a Medical Advance Directive? Yes Yes Yes Yes Yes Yes Yes  Type of Estate agent of [place];Living will   Living will;Healthcare Power of [employer] Power of [place];Living will   Does patient want to make changes to medical advance directive?       No - Patient declined  Copy of Healthcare Power of Attorney in Chart? No - copy requested     Yes - validated most recent copy scanned in chart (See row information)     Current Medications (verified) Outpatient Encounter Medications as of 10/21/2022  Medication Sig   acetaminophen (TYLENOL) 325 MG tablet Take 2 tablets (650 mg total) by mouth every 6 (six) hours as  needed for mild pain, headache, fever or moderate pain.   albuterol (PROAIR HFA) 108 (90 Base) MCG/ACT inhaler Inhale 2 puffs into the lungs every 6 (six) hours as needed for wheezing or shortness of breath. INHALE 2 PUFFS INTO THE LUNGS EVERY 6 HOURS AS NEEDED FOR WHEEZING OR SHORTNESS OF BREATH   amiodarone (PACERONE) 200 MG tablet Take 1 tablet (200 mg total) by mouth daily.   arformoterol (BROVANA) 15 MCG/2ML NEBU Take 2 mLs (15 mcg total) by nebulization at bedtime.   bisoprolol (ZEBETA) 5 MG tablet Take 0.5 tablets (2.5 mg total) by mouth daily.   budesonide (PULMICORT) 0.5 MG/2ML nebulizer solution Take 2 mLs (0.5 mg total) by nebulization in the morning and at bedtime.   busPIRone (BUSPAR) 15 MG tablet Take 15 mg by mouth 3 (three) times daily.   cephALEXin (KEFLEX) 500 MG capsule Take 1 capsule (500 mg total) by mouth 4 (four) times daily for 7 days.   Cholecalciferol (VITAMIN D) 50 MCG (2000 UT) tablet Take 1 tablet (2,000 Units total) by mouth daily.   Coenzyme Q-10 200 MG CAPS Take 200 mg by mouth daily.   Cyanocobalamin (B-12) 5000 MCG CAPS Take 5,000 mcg by mouth daily.   esomeprazole (NEXIUM) 40 MG capsule Take 1 capsule (40 mg total) by mouth daily at 12 noon.   ezetimibe (ZETIA) 10 MG tablet TAKE 1 TABLET BY MOUTH EVERYDAY AT BEDTIME   ferrous sulfate  325 (65 FE) MG tablet Take 1 tablet (325 mg total) by mouth daily.   FLUoxetine (PROZAC) 20 MG tablet Take 20 mg by mouth 2 (two) times daily.   glucose blood (ONETOUCH VERIO) test strip USE AS INSTRUCTED TO CHECK SUGAR 1 TIME DAILY   guaiFENesin (MUCINEX) 600 MG 12 hr tablet Take 2 tablets (1,200 mg total) by mouth 2 (two) times daily as needed for cough or to loosen phlegm.   isosorbide mononitrate (IMDUR) 30 MG 24 hr tablet Take 30 mg by mouth daily.   levothyroxine (SYNTHROID) 125 MCG tablet Take 1 tablet (125 mcg total) by mouth daily.   magnesium 30 MG tablet Take 1 tablet (30 mg total) by mouth 2 (two) times daily.    metFORMIN (GLUCOPHAGE) 500 MG tablet Take 1 tablet (500 mg total) by mouth every evening.   nitroGLYCERIN (NITROSTAT) 0.4 MG SL tablet Place 1 tablet (0.4 mg total) under the tongue every 5 (five) minutes as needed for chest pain.   OneTouch Delica Lancets 33G MISC Use 1x a day   OXYGEN Inhale into the lungs. 2lpm   potassium chloride (KLOR-CON) 10 MEQ tablet Take 1 tablet (10 mEq total) by mouth daily.   rosuvastatin (CRESTOR) 10 MG tablet TAKE 1 TABLET BY MOUTH EVERY OTHER DAY   Spacer/Aero Chamber Mouthpiece MISC Use with  inhaler as needed.  J44.9   torsemide (DEMADEX) 20 MG tablet Take 2 tablets (40 mg total) by mouth daily. Take an additional 20 mg daily as needed for weight greater then 230 pounds   doxycycline (VIBRAMYCIN) 100 MG capsule Take 1 capsule (100 mg total) by mouth 2 (two) times daily. (Patient not taking: Reported on 10/18/2022)   melatonin 5 MG TABS Take 1 tablet (5 mg total) by mouth at bedtime. (Patient not taking: Reported on 10/21/2022)   nystatin (MYCOSTATIN) 100000 UNIT/ML suspension Take 5 mLs (500,000 Units total) by mouth 4 (four) times daily. (Patient not taking: Reported on 10/21/2022)   predniSONE (DELTASONE) 20 MG tablet Take 2 tablets (40 mg total) by mouth daily with breakfast for 5 days. (Patient not taking: Reported on 10/21/2022)   Zinc Oxide (TRIPLE PASTE) 12.8 % ointment Apply 1 Application topically as needed for irritation. Every shift. (Patient not taking: Reported on 10/21/2022)   No facility-administered encounter medications on file as of 10/21/2022.    Allergies (verified) Antihistamines, diphenhydramine-type; Incruse ellipta [umeclidinium bromide]; Clarithromycin; Codeine; Diphenhydramine; Pregabalin; Sulfonamide derivatives; Tiotropium bromide monohydrate; Umeclidinium; and Vraylar [cariprazine]   History: Past Medical History:  Diagnosis Date   Allergy, unspecified not elsewhere classified    Anxiety    Aortic stenosis, mild 03/30/2022   a. TTE  03/29/2022: EF 55-60, no RWMA, GR 1 DD, normal RVSF, mild aortic stenosis (mean 10, V-max 212 cm/s, DI 0.70)   Cataract    Chronic diastolic congestive heart failure (HCC)    a. 06/2022 Echo: EF 60-65%, no rwma, nl RV fxn, mild MR, mild AS.   COPD (chronic obstructive pulmonary disease) (HCC) 03/08/1998   PFTs 12/12/2002 FEV 1 1.42 (64%) ratio 58 with no better after B2 and DLCO75%; PFTs 11/20/09 FEV1 1.50 (73%) ratio 50 no better after B2 with DLCO 62%; Hfa 50% 11/20/2009 >75%, 01/13/10 p coaching   Coronary artery calcification seen on CT scan    a. 2012 Neg stress test.   Depression 12/07/1998   Diabetes mellitus type II 02/05/2006   Dr. Elvera Lennox with endo   Disorders of bursae and tendons in shoulder region, unspecified  Rotator cuff syndrome, right   E. coli bacteremia    Esophagitis    GERD (gastroesophageal reflux disease)    History of UTI    Hyperlipidemia 10/04/2000   Hypertension 03/08/1992   Hypothyroidism 03/08/1968   Iron deficiency anemia    Lung nodule    radiation starts 10-08-2019   Malignant neoplasm of right upper lobe of lung (HCC) 07/24/2019   Obesity    NOS   OSA (obstructive sleep apnea)    PSG 01/27/10 AHI 13, pt does not know CPAP settings   PAF (paroxysmal atrial fibrillation) (HCC)    a. 06/2022 in setting of UTI/encephalopathy-->converted on amio; b. CHA2DS2VASc = 6-->No OAC 2/2 chronic anemia/fall risk; c. 06/2022 Zio: Predominantly sinus bradycardia at 54 (3-158)'s.  1 brief run of SVT.  2.2% PVC burden.   Peripheral neuropathy    Likely due to DM per Dr. Tresa Endo hernia    Past Surgical History:  Procedure Laterality Date   ABD U/S  03/19/1999   Nml x2 foci in liver   ADENOSINE MYOVIEW  06/02/2007   Nml   CARDIOLITE PERSANTINE  08/24/2000   Nml   CAROTID U/S  08/24/2000   1-39% ICA stenosis   CAROTID U/S  06/02/2007   No apprec change    CARPAL TUNNEL RELEASE  12/1997   Right   CESAREAN SECTION     x2 Breech/ repeat   CHOLECYSTECTOMY   1997   COLONOSCOPY WITH PROPOFOL N/A 02/12/2016   Procedure: COLONOSCOPY WITH PROPOFOL;  Surgeon: Rachael Fee, MD;  Location: [facility];  Service: Endoscopy;  Laterality: N/A;   CT ABD W & PELVIS WO/W CM     Abd hemangiomas of liver, 1 cm R renal cyst   DENTAL SURGERY  2016   Implants   DEXA  07/03/2003   Nml   ESOPHAGOGASTRODUODENOSCOPY  12/05/1997   Nml (due to hoarseness)   ESOPHAGOGASTRODUODENOSCOPY (EGD) WITH PROPOFOL N/A 02/12/2016   Procedure: ESOPHAGOGASTRODUODENOSCOPY (EGD) WITH PROPOFOL;  Surgeon: Rachael Fee, MD;  Location: [facility];  Service: Endoscopy;  Laterality: N/A;   GALLBLADDER SURGERY     HERNIA REPAIR  01/24/2009   Lap Ventr w/ Lysis of adhesions (Dr. Dwain Sarna)   knee arthroscopic surgery  years ago   right   ROTATOR CUFF REPAIR  1984   Right, [place]   SHOULDER OPEN ROTATOR CUFF REPAIR  02/08/2012   Procedure: ROTATOR CUFF REPAIR SHOULDER OPEN;  Surgeon: Drucilla Schmidt, MD;  Location: [facility];  Service: Orthopedics;  Laterality: Left;  Left Shoulder Open Anterior Acrominectomy Rotator Cuff Repair Open Distal Clavicle Resection ,tissue mend graft, and repair of biceps tendon   THUMB RELEASE  12/1997   Right   TONSILLECTOMY     TOTAL ABDOMINAL HYSTERECTOMY  1985   Due to dysmennorhea   US ECHOCARDIOGRAPHY  06/02/2007   Family History  Problem Relation Age of Onset   Heart disease Mother    Thyroid disease Mother    Emphysema Father        One lung   Cystic fibrosis Sister    Hyperthyroidism Sister    Osteoarthritis Brother    Hyperthyroidism Brother    Esophageal cancer Neg Hx    Cancer Neg Hx        Head or neck   Colon cancer Neg Hx    Stomach cancer Neg Hx    Breast cancer Neg Hx    Social History   Socioeconomic History   Marital status: Widowed  Spouse name: Not on file   Number of children: 2   Years of education: Not on file   Highest education level: Not on file  Occupational History   Occupation: Retired     Associate Professor: RETIRED  Tobacco Use   Smoking status: Former    Current packs/day: 0.00    Average packs/day: 2.0 packs/day for 50.0 years (100.0 ttl pk-yrs)    Types: Cigarettes    Start date: 02/06/1955    Quit date: 02/05/2005    Years since quitting: 17.7   Smokeless tobacco: Never  Vaping Use   Vaping status: Never Used  Substance and Sexual Activity   Alcohol use: Yes    Comment: occasionally   Drug use: Never   Sexual activity: Not Currently  Other Topics Concern   Not on file  Social History Narrative   Widowed 2023, 2 children; Enjoys painting      Quit smoking in 2007; no alcohol; lives in [place]; [name]/d lives in [place]. Had own business. Dont drive- sec to neuropathy.    Social Determinants of Health   Financial Resource Strain: Low Risk  (10/21/2022)   Overall Financial Resource Strain (CARDIA)    Difficulty of Paying Living Expenses: Not hard at all  Food Insecurity: No Food Insecurity (10/21/2022)   Hunger Vital Sign    Worried About Running Out of Food in the Last Year: Never true    Ran Out of Food in the Last Year: Never true  Transportation Needs: No Transportation Needs (10/21/2022)   PRAPARE - Administrator, Civil Service (Medical): No    Lack of Transportation (Non-Medical): No  Physical Activity: Insufficiently Active (10/21/2022)   Exercise Vital Sign    Days of Exercise per Week: 3 days    Minutes of Exercise per Session: 20 min  Stress: No Stress Concern Present (10/21/2022)   [employer] of Occupational Health - Occupational Stress Questionnaire    Feeling of Stress : Not at all  Social Connections: Socially Isolated (10/21/2022)   Social Connection and Isolation Panel [NHANES]    Frequency of Communication with Friends and Family: More than three times a week    Frequency of Social Gatherings with Friends and Family: More than three times a week    Attends Religious Services: Never    Database administrator or Organizations: No     Attends Banker Meetings: Never    Marital Status: Widowed    Tobacco Counseling Counseling given: Not Answered   Clinical Intake:  Pre-visit preparation completed: Yes  Pain : 0-10 Pain Score: 8  Pain Location: Knee Pain Descriptors / Indicators: Aching, Sharp Pain Onset: More than a month ago     BMI - recorded: 37.77 Nutritional Status: BMI > 30  Obese Nutritional Risks: None Diabetes: Yes CBG done?: Yes (119 per pt) CBG resulted in Enter/ Edit results?: No Did pt. bring in CBG monitor from home?: No  How often do you need to have someone help you when you read instructions, pamphlets, or other written materials from your doctor or pharmacy?: 1 - Never  Interpreter Needed?: No  Information entered by :: C. LPN   Activities of Daily Living    10/21/2022    1:28 PM 06/30/2022    9:00 AM  In your present state of health, do you have any difficulty performing the following activities:  Hearing? 1 0  Comment some hearing loss   Vision? 1 0  Comment Has cataracts  Difficulty concentrating or making decisions? 1 0  Comment Slower recall   Walking or climbing stairs? 1 0  Comment Unable to climb stairs   Dressing or bathing? 1 0  Comment aids   Doing errands, shopping? 1 0  Comment Family assists   Preparing Food and eating ? Y   Using the Toilet? Y   In the past six months, have you accidently leaked urine? Y   Comment wears pad   Do you have problems with loss of bowel control? N   Managing your Medications? Y   Managing your Finances? Y   Housekeeping or managing your Housekeeping? Y     Patient Care Team: Joaquim Nam, MD as PCP - General Mariah Milling Tollie Pizza, MD as PCP - Cardiology (Cardiology) Antonieta Iba, MD as Consulting Physician (Cardiology) Oneta Rack, NP as Nurse Practitioner (Adult Health Nurse Practitioner) Earna Coder, MD as Consulting Physician (Oncology)  Indicate any recent Medical Services  you may have received from other than [facility] providers in the past year (date may be approximate).     Assessment:   This is a routine wellness examination for [place].  Hearing/Vision screen Hearing Screening - Comments:: Has some hearing issues Vision Screening - Comments:: Glasses - Has Cataract removal scheduled for October 2024- Dr.MCceun - Pt is UTD on exams  Dietary issues and exercise activities discussed:     Goals Addressed             This Visit's Progress    Patient Stated       Would like to be more active. Move around easier.       Depression Screen    10/21/2022    1:11 PM 08/03/2022   11:38 AM 03/12/2022    2:10 PM 06/08/2021    2:55 PM 02/05/2020   12:17 PM 06/14/2018   12:59 PM 02/09/2017    2:39 PM  PHQ 2/9 Scores  PHQ - 2 Score 0 1 0 0 0 1 4  PHQ- 9 Score  5 0   1 19  Exception Documentation    Other- indicate reason in comment box       Fall Risk    10/21/2022    1:27 PM 08/03/2022   11:37 AM 10/16/2021   11:38 AM 06/08/2021    2:55 PM 02/05/2020   12:17 PM  Fall Risk   Falls in the past year? 1 1 1 1  0  Number falls in past yr: 1 1 0  0  Comment has slipped down to the floor 5 times in last 12 months      Injury with Fall? 0 0 1 1 0  Risk for fall due to : Impaired mobility;Impaired balance/gait;Impaired vision Impaired balance/gait;Impaired mobility     Follow up Falls evaluation completed;Falls prevention discussed;Education provided Falls evaluation completed  Falls evaluation completed Falls evaluation completed    MEDICARE RISK AT HOME:  Medicare Risk at Home - 10/21/22 1331     Any stairs in or around the home? No   has ramp   If so, are there any without handrails? No    Home free of loose throw rugs in walkways, pet beds, electrical cords, etc? Yes    Adequate lighting in your home to reduce risk of falls? Yes    Life alert? No    Use of a cane, walker or w/c? Yes   wheelchail, walker   Grab bars in the bathroom? Yes    Shower chair  or  bench in shower? Yes    Elevated toilet seat or a handicapped toilet? Yes             TIMED UP AND GO:  Was the test performed?  No    Cognitive Function:        10/21/2022    1:32 PM  6CIT Screen  What Year? 0 points  What month? 0 points  What time? 0 points  Count back from 20 0 points  Months in reverse 0 points  Repeat phrase 0 points  Total Score 0 points    Immunizations Immunization History  Administered Date(s) Administered   DT (Pediatric) 12/23/2010   Fluad Quad(high Dose 65+) 12/28/2018, 12/05/2019, 03/16/2022   H1N1 05/30/2008   Influenza Split 12/23/2010, 12/16/2011, 01/08/2013   Influenza Whole 12/07/1995, 12/07/2005, 01/04/2008, 01/06/2010   Influenza, High Dose Seasonal PF 01/06/2016, 01/06/2017, 01/07/2021   Influenza,inj,Quad PF,6+ Mos 12/26/2013, 12/05/2014, 11/28/2017   Influenza-Unspecified 03/16/2022   PFIZER(Purple Top)SARS-COV-2 Vaccination 03/27/2019, 04/18/2019, 01/18/2020   PPD Test 10/02/2014   Pneumococcal Conjugate-13 08/17/2013   Pneumococcal Polysaccharide-23 12/06/2000, 03/08/2005, 10/24/2008, 12/28/2018   Td 03/08/1993, 10/04/2000, 12/23/2010   Tdap 10/01/2016, 04/30/2021   Zoster, Live 12/07/2005    TDAP status: Up to date  Flu Vaccine status: Due, Education has been provided regarding the importance of this vaccine. Advised may receive this vaccine at local pharmacy or Health Dept. Aware to provide a copy of the vaccination record if obtained from local pharmacy or Health Dept. Verbalized acceptance and understanding.  Pneumococcal vaccine status: Up to date  Covid-19 vaccine status: Declined, Education has been provided regarding the importance of this vaccine but patient still declined. Advised may receive this vaccine at local pharmacy or Health Dept.or vaccine clinic. Aware to provide a copy of the vaccination record if obtained from local pharmacy or Health Dept. Verbalized acceptance and understanding.  Qualifies for  Shingles Vaccine? Yes   Zostavax completed Yes   Shingrix Completed?: No.    Education has been provided regarding the importance of this vaccine. Patient has been advised to call insurance company to determine out of pocket expense if they have not yet received this vaccine. Advised may also receive vaccine at local pharmacy or Health Dept. Verbalized acceptance and understanding.  Screening Tests Health Maintenance  Topic Date Due   Zoster Vaccines- Shingrix (1 of 2) 09/02/1954   Diabetic kidney evaluation - Urine ACR  01/12/2008   OPHTHALMOLOGY EXAM  06/24/2022   HEMOGLOBIN A1C  09/14/2022   INFLUENZA VACCINE  10/07/2022   FOOT EXAM  08/26/2023   Diabetic kidney evaluation - eGFR measurement  09/15/2023   Medicare Annual Wellness (AWV)  09/17/2023   DTaP/Tdap/Td (7 - Td or Tdap) 05/01/2031   Pneumonia Vaccine 71+ Years old  Completed   DEXA SCAN  Completed   HPV VACCINES  Aged Out   COVID-19 Vaccine  Discontinued    Health Maintenance  Health Maintenance Due  Topic Date Due   Zoster Vaccines- Shingrix (1 of 2) 09/02/1954   Diabetic kidney evaluation - Urine ACR  01/12/2008   OPHTHALMOLOGY EXAM  06/24/2022   HEMOGLOBIN A1C  09/14/2022   INFLUENZA VACCINE  10/07/2022    Colorectal cancer screening: No longer required.   Mammogram status: Completed 03/06/20. Repeat every year per pt has mammogram scheduled for 12/07/22 .  Bone Density status: Completed 06/25/20. Results reflect: Bone density results: OSTEOPENIA. Repeat every 2 years. per pt has mammogram scheduled for 12/07/22 .   Lung Cancer Screening: (  Low Dose CT Chest recommended if Age 51-80 years, 20 pack-year currently smoking OR have quit w/in 15years.) does qualify.   Lung Cancer Screening Referral: Followed by pulmonary  Additional Screening:  Hepatitis C Screening: does not qualify; Completed    Vision Screening: Recommended annual ophthalmology exams for early detection of glaucoma and other disorders of  the eye. Is the patient up to date with their annual eye exam?  Yes  Who is the provider or what is the name of the office in which the patient attends annual eye exams? Dr.Porfilio If pt is not established with a provider, would they like to be referred to a provider to establish care? No .   Dental Screening: Recommended annual dental exams for proper oral hygiene  Diabetic Foot Exam: Diabetic Foot Exam: Completed 08/26/22  Community Resource Referral / Chronic Care Management: CRR required this visit?  No   CCM required this visit?  No     Plan:     I have personally reviewed and noted the following in the patient's chart:   Medical and social history Use of alcohol, tobacco or illicit drugs  Current medications and supplements including opioid prescriptions. Patient is not currently taking opioid prescriptions. Functional ability and status Nutritional status Physical activity Advanced directives List of other physicians Hospitalizations, surgeries, and ER visits in previous 12 months Vitals Screenings to include cognitive, depression, and falls Referrals and appointments  In addition, I have reviewed and discussed with patient certain preventive protocols, quality metrics, and best practice recommendations. A written personalized care plan for preventive services as well as general preventive health recommendations were provided to patient.     Maryan Puls, LPN   11/20/7827   After Visit Summary: (MyChart) Due to this being a telephonic visit, the after visit summary with patients personalized plan was offered to patient via MyChart   Nurse Notes: none

## 2022-10-22 ENCOUNTER — Telehealth: Payer: Self-pay | Admitting: Nurse Practitioner

## 2022-10-22 ENCOUNTER — Other Ambulatory Visit (HOSPITAL_BASED_OUTPATIENT_CLINIC_OR_DEPARTMENT_OTHER): Payer: Self-pay

## 2022-10-22 DIAGNOSIS — J4489 Other specified chronic obstructive pulmonary disease: Secondary | ICD-10-CM

## 2022-10-22 DIAGNOSIS — J439 Emphysema, unspecified: Secondary | ICD-10-CM

## 2022-10-22 MED ORDER — BUDESONIDE 0.5 MG/2ML IN SUSP
0.5000 mg | Freq: Two times a day (BID) | RESPIRATORY_TRACT | 3 refills | Status: DC
Start: 2022-10-22 — End: 2023-01-08

## 2022-10-22 NOTE — Telephone Encounter (Signed)
Spoke to Sigourney by phone. Reviewed plan of care for her mother and rationale. She reports that her mother is feeling better, more alert, and more active. She's pleased with current plan and will let me know if there are changes.

## 2022-10-26 ENCOUNTER — Other Ambulatory Visit: Payer: Self-pay | Admitting: Physician Assistant

## 2022-10-26 ENCOUNTER — Inpatient Hospital Stay: Payer: Medicare Other

## 2022-10-26 ENCOUNTER — Other Ambulatory Visit: Payer: Self-pay

## 2022-10-26 ENCOUNTER — Other Ambulatory Visit: Payer: Self-pay | Admitting: Nurse Practitioner

## 2022-10-26 ENCOUNTER — Other Ambulatory Visit: Payer: Self-pay | Admitting: *Deleted

## 2022-10-26 ENCOUNTER — Other Ambulatory Visit: Payer: Self-pay | Admitting: Family Medicine

## 2022-10-26 DIAGNOSIS — N1832 Chronic kidney disease, stage 3b: Secondary | ICD-10-CM | POA: Diagnosis not present

## 2022-10-26 DIAGNOSIS — D500 Iron deficiency anemia secondary to blood loss (chronic): Secondary | ICD-10-CM

## 2022-10-26 DIAGNOSIS — D509 Iron deficiency anemia, unspecified: Secondary | ICD-10-CM | POA: Diagnosis not present

## 2022-10-26 DIAGNOSIS — E538 Deficiency of other specified B group vitamins: Secondary | ICD-10-CM | POA: Diagnosis not present

## 2022-10-26 DIAGNOSIS — I480 Paroxysmal atrial fibrillation: Secondary | ICD-10-CM

## 2022-10-26 DIAGNOSIS — D631 Anemia in chronic kidney disease: Secondary | ICD-10-CM | POA: Diagnosis not present

## 2022-10-26 DIAGNOSIS — D649 Anemia, unspecified: Secondary | ICD-10-CM

## 2022-10-26 LAB — CBC WITH DIFFERENTIAL (CANCER CENTER ONLY)
Abs Immature Granulocytes: 0.03 10*3/uL (ref 0.00–0.07)
Basophils Absolute: 0 10*3/uL (ref 0.0–0.1)
Basophils Relative: 0 %
Eosinophils Absolute: 0.1 10*3/uL (ref 0.0–0.5)
Eosinophils Relative: 2 %
HCT: 33.2 % — ABNORMAL LOW (ref 36.0–46.0)
Hemoglobin: 10.1 g/dL — ABNORMAL LOW (ref 12.0–15.0)
Immature Granulocytes: 0 %
Lymphocytes Relative: 13 %
Lymphs Abs: 0.9 10*3/uL (ref 0.7–4.0)
MCH: 31.2 pg (ref 26.0–34.0)
MCHC: 30.4 g/dL (ref 30.0–36.0)
MCV: 102.5 fL — ABNORMAL HIGH (ref 80.0–100.0)
Monocytes Absolute: 0.6 10*3/uL (ref 0.1–1.0)
Monocytes Relative: 8 %
Neutro Abs: 5.4 10*3/uL (ref 1.7–7.7)
Neutrophils Relative %: 77 %
Platelet Count: 171 10*3/uL (ref 150–400)
RBC: 3.24 MIL/uL — ABNORMAL LOW (ref 3.87–5.11)
RDW: 14.6 % (ref 11.5–15.5)
WBC Count: 7 10*3/uL (ref 4.0–10.5)
nRBC: 0 % (ref 0.0–0.2)

## 2022-10-27 DIAGNOSIS — M170 Bilateral primary osteoarthritis of knee: Secondary | ICD-10-CM | POA: Diagnosis not present

## 2022-10-28 DIAGNOSIS — E1142 Type 2 diabetes mellitus with diabetic polyneuropathy: Secondary | ICD-10-CM | POA: Diagnosis not present

## 2022-10-28 DIAGNOSIS — J9621 Acute and chronic respiratory failure with hypoxia: Secondary | ICD-10-CM | POA: Diagnosis not present

## 2022-10-28 DIAGNOSIS — I110 Hypertensive heart disease with heart failure: Secondary | ICD-10-CM | POA: Diagnosis not present

## 2022-10-28 DIAGNOSIS — J9622 Acute and chronic respiratory failure with hypercapnia: Secondary | ICD-10-CM | POA: Diagnosis not present

## 2022-10-28 DIAGNOSIS — I5033 Acute on chronic diastolic (congestive) heart failure: Secondary | ICD-10-CM | POA: Diagnosis not present

## 2022-10-28 DIAGNOSIS — I480 Paroxysmal atrial fibrillation: Secondary | ICD-10-CM | POA: Diagnosis not present

## 2022-10-29 DIAGNOSIS — J9622 Acute and chronic respiratory failure with hypercapnia: Secondary | ICD-10-CM | POA: Diagnosis not present

## 2022-10-29 DIAGNOSIS — I5033 Acute on chronic diastolic (congestive) heart failure: Secondary | ICD-10-CM | POA: Diagnosis not present

## 2022-10-29 DIAGNOSIS — J9621 Acute and chronic respiratory failure with hypoxia: Secondary | ICD-10-CM | POA: Diagnosis not present

## 2022-10-29 DIAGNOSIS — I110 Hypertensive heart disease with heart failure: Secondary | ICD-10-CM | POA: Diagnosis not present

## 2022-10-29 DIAGNOSIS — E1142 Type 2 diabetes mellitus with diabetic polyneuropathy: Secondary | ICD-10-CM | POA: Diagnosis not present

## 2022-10-29 DIAGNOSIS — I480 Paroxysmal atrial fibrillation: Secondary | ICD-10-CM | POA: Diagnosis not present

## 2022-10-31 DIAGNOSIS — J9621 Acute and chronic respiratory failure with hypoxia: Secondary | ICD-10-CM | POA: Diagnosis not present

## 2022-10-31 DIAGNOSIS — I5033 Acute on chronic diastolic (congestive) heart failure: Secondary | ICD-10-CM | POA: Diagnosis not present

## 2022-10-31 DIAGNOSIS — N390 Urinary tract infection, site not specified: Secondary | ICD-10-CM | POA: Diagnosis not present

## 2022-10-31 DIAGNOSIS — I110 Hypertensive heart disease with heart failure: Secondary | ICD-10-CM | POA: Diagnosis not present

## 2022-10-31 DIAGNOSIS — J439 Emphysema, unspecified: Secondary | ICD-10-CM | POA: Diagnosis not present

## 2022-10-31 DIAGNOSIS — G4733 Obstructive sleep apnea (adult) (pediatric): Secondary | ICD-10-CM | POA: Diagnosis not present

## 2022-10-31 DIAGNOSIS — E039 Hypothyroidism, unspecified: Secondary | ICD-10-CM | POA: Diagnosis not present

## 2022-10-31 DIAGNOSIS — I480 Paroxysmal atrial fibrillation: Secondary | ICD-10-CM | POA: Diagnosis not present

## 2022-10-31 DIAGNOSIS — K219 Gastro-esophageal reflux disease without esophagitis: Secondary | ICD-10-CM | POA: Diagnosis not present

## 2022-10-31 DIAGNOSIS — B9620 Unspecified Escherichia coli [E. coli] as the cause of diseases classified elsewhere: Secondary | ICD-10-CM | POA: Diagnosis not present

## 2022-10-31 DIAGNOSIS — E1142 Type 2 diabetes mellitus with diabetic polyneuropathy: Secondary | ICD-10-CM | POA: Diagnosis not present

## 2022-10-31 DIAGNOSIS — J9622 Acute and chronic respiratory failure with hypercapnia: Secondary | ICD-10-CM | POA: Diagnosis not present

## 2022-11-01 ENCOUNTER — Telehealth: Payer: Self-pay | Admitting: Family Medicine

## 2022-11-01 NOTE — Telephone Encounter (Signed)
Please give the order.  Thanks.   

## 2022-11-01 NOTE — Telephone Encounter (Signed)
Home Health verbal orders Caller Name:Cecilia Agency Name: [facility] number: [phone]  Requesting OT/PT/Skilled nursing/Social Work/Speech: PT  Reason: strengthening,balance,gate training  Frequency: additional one more visit this week  Please forward to [facility] pool or providers CMA

## 2022-11-01 NOTE — Telephone Encounter (Signed)
Verbal orders given  

## 2022-11-02 ENCOUNTER — Inpatient Hospital Stay: Payer: Medicare Other

## 2022-11-02 VITALS — BP 106/42 | HR 52 | Temp 97.5°F | Resp 18

## 2022-11-02 DIAGNOSIS — D500 Iron deficiency anemia secondary to blood loss (chronic): Secondary | ICD-10-CM

## 2022-11-02 DIAGNOSIS — E538 Deficiency of other specified B group vitamins: Secondary | ICD-10-CM | POA: Diagnosis not present

## 2022-11-02 DIAGNOSIS — D631 Anemia in chronic kidney disease: Secondary | ICD-10-CM | POA: Diagnosis not present

## 2022-11-02 DIAGNOSIS — N1832 Chronic kidney disease, stage 3b: Secondary | ICD-10-CM | POA: Diagnosis not present

## 2022-11-02 DIAGNOSIS — D649 Anemia, unspecified: Secondary | ICD-10-CM

## 2022-11-02 DIAGNOSIS — D509 Iron deficiency anemia, unspecified: Secondary | ICD-10-CM | POA: Diagnosis not present

## 2022-11-02 MED ORDER — METHYLPREDNISOLONE SODIUM SUCC 40 MG IJ SOLR: 20.0000 mg | Freq: Once | INTRAMUSCULAR | Status: DC | PRN

## 2022-11-02 MED ORDER — SODIUM CHLORIDE 0.9 % IV SOLN
INTRAVENOUS | Status: DC
  Filled 2022-11-02 (×2): qty 250

## 2022-11-02 MED ORDER — SODIUM CHLORIDE 0.9 % IV SOLN
Freq: Once | INTRAVENOUS | Status: DC
  Filled 2022-11-02: qty 250

## 2022-11-02 MED ORDER — SODIUM CHLORIDE 0.9 % IV SOLN
200.0000 mg | Freq: Once | INTRAVENOUS | Status: AC
  Administered 2022-11-02: 200 mg via INTRAVENOUS
  Filled 2022-11-02: qty 200

## 2022-11-02 MED ORDER — METHYLPREDNISOLONE NA SUC (PF) 125 MG IJ SOLR
20.0000 mg | Freq: Once | INTRAMUSCULAR | Status: AC
  Administered 2022-11-02: 20 mg via INTRAVENOUS
  Filled 2022-11-02: qty 0.32

## 2022-11-02 NOTE — Patient Instructions (Signed)
 Iron Sucrose Injection What is this medication? IRON SUCROSE (EYE ern SOO krose) treats low levels of iron (iron deficiency anemia) in people with kidney disease. Iron is a mineral that plays an important role in making red blood cells, which carry oxygen from your lungs to the rest of your body. This medicine may be used for other purposes; ask your health care provider or pharmacist if you have questions. COMMON BRAND NAME(S): Venofer What should I tell my care team before I take this medication? They need to know if you have any of these conditions: Anemia not caused by low iron levels Heart disease High levels of iron in the blood Kidney disease Liver disease An unusual or allergic reaction to iron, other medications, foods, dyes, or preservatives Pregnant or trying to get pregnant Breastfeeding How should I use this medication? This medication is for infusion into a vein. It is given in a hospital or clinic setting. Talk to your care team about the use of this medication in children. While this medication may be prescribed for children as young as 2 years for selected conditions, precautions do apply. Overdosage: If you think you have taken too much of this medicine contact a poison control center or emergency room at once. NOTE: This medicine is only for you. Do not share this medicine with others. What if I miss a dose? Keep appointments for follow-up doses. It is important not to miss your dose. Call your care team if you are unable to keep an appointment. What may interact with this medication? Do not take this medication with any of the following: Deferoxamine Dimercaprol Other iron products This medication may also interact with the following: Chloramphenicol Deferasirox This list may not describe all possible interactions. Give your health care provider a list of all the medicines, herbs, non-prescription drugs, or dietary supplements you use. Also tell them if you smoke,  drink alcohol, or use illegal drugs. Some items may interact with your medicine. What should I watch for while using this medication? Visit your care team regularly. Tell your care team if your symptoms do not start to get better or if they get worse. You may need blood work done while you are taking this medication. You may need to follow a special diet. Talk to your care team. Foods that contain iron include: whole grains/cereals, dried fruits, beans, or peas, leafy green vegetables, and organ meats (liver, kidney). What side effects may I notice from receiving this medication? Side effects that you should report to your care team as soon as possible: Allergic reactions--skin rash, itching, hives, swelling of the face, lips, tongue, or throat Low blood pressure--dizziness, feeling faint or lightheaded, blurry vision Shortness of breath Side effects that usually do not require medical attention (report to your care team if they continue or are bothersome): Flushing Headache Joint pain Muscle pain Nausea Pain, redness, or irritation at injection site This list may not describe all possible side effects. Call your doctor for medical advice about side effects. You may report side effects to FDA at 1-800-FDA-1088. Where should I keep my medication? This medication is given in a hospital or clinic. It will not be stored at home. NOTE: This sheet is a summary. It may not cover all possible information. If you have questions about this medicine, talk to your doctor, pharmacist, or health care provider.  2024 [employer] (2022-07-30 00:00:00)

## 2022-11-03 DIAGNOSIS — J9622 Acute and chronic respiratory failure with hypercapnia: Secondary | ICD-10-CM | POA: Diagnosis not present

## 2022-11-03 DIAGNOSIS — I110 Hypertensive heart disease with heart failure: Secondary | ICD-10-CM | POA: Diagnosis not present

## 2022-11-03 DIAGNOSIS — E1142 Type 2 diabetes mellitus with diabetic polyneuropathy: Secondary | ICD-10-CM | POA: Diagnosis not present

## 2022-11-03 DIAGNOSIS — I5033 Acute on chronic diastolic (congestive) heart failure: Secondary | ICD-10-CM | POA: Diagnosis not present

## 2022-11-03 DIAGNOSIS — J9621 Acute and chronic respiratory failure with hypoxia: Secondary | ICD-10-CM | POA: Diagnosis not present

## 2022-11-03 DIAGNOSIS — I480 Paroxysmal atrial fibrillation: Secondary | ICD-10-CM | POA: Diagnosis not present

## 2022-11-05 ENCOUNTER — Other Ambulatory Visit: Payer: Self-pay

## 2022-11-05 ENCOUNTER — Ambulatory Visit: Payer: Self-pay

## 2022-11-05 ENCOUNTER — Ambulatory Visit: Payer: Self-pay | Admitting: Internal Medicine

## 2022-11-05 DIAGNOSIS — J9622 Acute and chronic respiratory failure with hypercapnia: Secondary | ICD-10-CM | POA: Diagnosis not present

## 2022-11-05 DIAGNOSIS — I480 Paroxysmal atrial fibrillation: Secondary | ICD-10-CM | POA: Diagnosis not present

## 2022-11-05 DIAGNOSIS — J9621 Acute and chronic respiratory failure with hypoxia: Secondary | ICD-10-CM | POA: Diagnosis not present

## 2022-11-05 DIAGNOSIS — I5033 Acute on chronic diastolic (congestive) heart failure: Secondary | ICD-10-CM | POA: Diagnosis not present

## 2022-11-05 DIAGNOSIS — E1142 Type 2 diabetes mellitus with diabetic polyneuropathy: Secondary | ICD-10-CM | POA: Diagnosis not present

## 2022-11-05 DIAGNOSIS — I110 Hypertensive heart disease with heart failure: Secondary | ICD-10-CM | POA: Diagnosis not present

## 2022-11-08 DIAGNOSIS — I480 Paroxysmal atrial fibrillation: Secondary | ICD-10-CM | POA: Diagnosis not present

## 2022-11-08 DIAGNOSIS — J9621 Acute and chronic respiratory failure with hypoxia: Secondary | ICD-10-CM | POA: Diagnosis not present

## 2022-11-08 DIAGNOSIS — E1142 Type 2 diabetes mellitus with diabetic polyneuropathy: Secondary | ICD-10-CM | POA: Diagnosis not present

## 2022-11-08 DIAGNOSIS — I5033 Acute on chronic diastolic (congestive) heart failure: Secondary | ICD-10-CM | POA: Diagnosis not present

## 2022-11-08 DIAGNOSIS — I110 Hypertensive heart disease with heart failure: Secondary | ICD-10-CM | POA: Diagnosis not present

## 2022-11-08 DIAGNOSIS — J9622 Acute and chronic respiratory failure with hypercapnia: Secondary | ICD-10-CM | POA: Diagnosis not present

## 2022-11-09 ENCOUNTER — Inpatient Hospital Stay: Payer: Medicare Other | Attending: Internal Medicine

## 2022-11-09 ENCOUNTER — Inpatient Hospital Stay: Payer: Medicare Other

## 2022-11-09 VITALS — BP 143/55

## 2022-11-09 DIAGNOSIS — D509 Iron deficiency anemia, unspecified: Secondary | ICD-10-CM | POA: Insufficient documentation

## 2022-11-09 DIAGNOSIS — K921 Melena: Secondary | ICD-10-CM | POA: Diagnosis not present

## 2022-11-09 DIAGNOSIS — R5383 Other fatigue: Secondary | ICD-10-CM | POA: Insufficient documentation

## 2022-11-09 DIAGNOSIS — N1832 Chronic kidney disease, stage 3b: Secondary | ICD-10-CM | POA: Diagnosis not present

## 2022-11-09 DIAGNOSIS — D631 Anemia in chronic kidney disease: Secondary | ICD-10-CM | POA: Diagnosis not present

## 2022-11-09 DIAGNOSIS — D500 Iron deficiency anemia secondary to blood loss (chronic): Secondary | ICD-10-CM

## 2022-11-09 DIAGNOSIS — R0602 Shortness of breath: Secondary | ICD-10-CM | POA: Insufficient documentation

## 2022-11-09 DIAGNOSIS — D649 Anemia, unspecified: Secondary | ICD-10-CM

## 2022-11-09 DIAGNOSIS — E538 Deficiency of other specified B group vitamins: Secondary | ICD-10-CM | POA: Insufficient documentation

## 2022-11-09 DIAGNOSIS — I959 Hypotension, unspecified: Secondary | ICD-10-CM | POA: Insufficient documentation

## 2022-11-09 LAB — CBC WITH DIFFERENTIAL/PLATELET
Abs Immature Granulocytes: 0.05 10*3/uL (ref 0.00–0.07)
Basophils Absolute: 0 10*3/uL (ref 0.0–0.1)
Basophils Relative: 0 %
Eosinophils Absolute: 0.1 10*3/uL (ref 0.0–0.5)
Eosinophils Relative: 2 %
HCT: 28.8 % — ABNORMAL LOW (ref 36.0–46.0)
Hemoglobin: 8.7 g/dL — ABNORMAL LOW (ref 12.0–15.0)
Immature Granulocytes: 1 %
Lymphocytes Relative: 12 %
Lymphs Abs: 0.9 10*3/uL (ref 0.7–4.0)
MCH: 31.5 pg (ref 26.0–34.0)
MCHC: 30.2 g/dL (ref 30.0–36.0)
MCV: 104.3 fL — ABNORMAL HIGH (ref 80.0–100.0)
Monocytes Absolute: 0.5 10*3/uL (ref 0.1–1.0)
Monocytes Relative: 6 %
Neutro Abs: 5.8 10*3/uL (ref 1.7–7.7)
Neutrophils Relative %: 79 %
Platelets: 131 10*3/uL — ABNORMAL LOW (ref 150–400)
RBC: 2.76 MIL/uL — ABNORMAL LOW (ref 3.87–5.11)
RDW: 14.6 % (ref 11.5–15.5)
WBC: 7.4 10*3/uL (ref 4.0–10.5)
nRBC: 0 % (ref 0.0–0.2)

## 2022-11-09 MED ORDER — EPOETIN ALFA-EPBX 20000 UNIT/ML IJ SOLN
20000.0000 [IU] | Freq: Once | INTRAMUSCULAR | Status: AC
  Administered 2022-11-09: 20000 [IU] via SUBCUTANEOUS
  Filled 2022-11-09: qty 1

## 2022-11-10 DIAGNOSIS — E1142 Type 2 diabetes mellitus with diabetic polyneuropathy: Secondary | ICD-10-CM | POA: Diagnosis not present

## 2022-11-10 DIAGNOSIS — G4733 Obstructive sleep apnea (adult) (pediatric): Secondary | ICD-10-CM | POA: Diagnosis not present

## 2022-11-10 DIAGNOSIS — I2510 Atherosclerotic heart disease of native coronary artery without angina pectoris: Secondary | ICD-10-CM | POA: Diagnosis not present

## 2022-11-10 DIAGNOSIS — I5033 Acute on chronic diastolic (congestive) heart failure: Secondary | ICD-10-CM | POA: Diagnosis not present

## 2022-11-10 DIAGNOSIS — E662 Morbid (severe) obesity with alveolar hypoventilation: Secondary | ICD-10-CM | POA: Diagnosis not present

## 2022-11-10 DIAGNOSIS — J4489 Other specified chronic obstructive pulmonary disease: Secondary | ICD-10-CM | POA: Diagnosis not present

## 2022-11-10 DIAGNOSIS — I350 Nonrheumatic aortic (valve) stenosis: Secondary | ICD-10-CM | POA: Diagnosis not present

## 2022-11-10 DIAGNOSIS — F32A Depression, unspecified: Secondary | ICD-10-CM | POA: Diagnosis not present

## 2022-11-10 DIAGNOSIS — E039 Hypothyroidism, unspecified: Secondary | ICD-10-CM | POA: Diagnosis not present

## 2022-11-10 DIAGNOSIS — I110 Hypertensive heart disease with heart failure: Secondary | ICD-10-CM | POA: Diagnosis not present

## 2022-11-10 DIAGNOSIS — B9620 Unspecified Escherichia coli [E. coli] as the cause of diseases classified elsewhere: Secondary | ICD-10-CM | POA: Diagnosis not present

## 2022-11-10 DIAGNOSIS — K219 Gastro-esophageal reflux disease without esophagitis: Secondary | ICD-10-CM | POA: Diagnosis not present

## 2022-11-10 DIAGNOSIS — I480 Paroxysmal atrial fibrillation: Secondary | ICD-10-CM | POA: Diagnosis not present

## 2022-11-10 DIAGNOSIS — Z9981 Dependence on supplemental oxygen: Secondary | ICD-10-CM | POA: Diagnosis not present

## 2022-11-10 DIAGNOSIS — Z6841 Body Mass Index (BMI) 40.0 and over, adult: Secondary | ICD-10-CM | POA: Diagnosis not present

## 2022-11-10 DIAGNOSIS — J9621 Acute and chronic respiratory failure with hypoxia: Secondary | ICD-10-CM | POA: Diagnosis not present

## 2022-11-10 DIAGNOSIS — E785 Hyperlipidemia, unspecified: Secondary | ICD-10-CM | POA: Diagnosis not present

## 2022-11-10 DIAGNOSIS — F4323 Adjustment disorder with mixed anxiety and depressed mood: Secondary | ICD-10-CM | POA: Diagnosis not present

## 2022-11-10 DIAGNOSIS — J9622 Acute and chronic respiratory failure with hypercapnia: Secondary | ICD-10-CM | POA: Diagnosis not present

## 2022-11-10 DIAGNOSIS — Z7984 Long term (current) use of oral hypoglycemic drugs: Secondary | ICD-10-CM | POA: Diagnosis not present

## 2022-11-10 DIAGNOSIS — N390 Urinary tract infection, site not specified: Secondary | ICD-10-CM | POA: Diagnosis not present

## 2022-11-10 DIAGNOSIS — F419 Anxiety disorder, unspecified: Secondary | ICD-10-CM | POA: Diagnosis not present

## 2022-11-10 DIAGNOSIS — Z85118 Personal history of other malignant neoplasm of bronchus and lung: Secondary | ICD-10-CM | POA: Diagnosis not present

## 2022-11-10 DIAGNOSIS — J439 Emphysema, unspecified: Secondary | ICD-10-CM | POA: Diagnosis not present

## 2022-11-10 DIAGNOSIS — D509 Iron deficiency anemia, unspecified: Secondary | ICD-10-CM | POA: Diagnosis not present

## 2022-11-11 DIAGNOSIS — J9621 Acute and chronic respiratory failure with hypoxia: Secondary | ICD-10-CM | POA: Diagnosis not present

## 2022-11-11 DIAGNOSIS — I110 Hypertensive heart disease with heart failure: Secondary | ICD-10-CM | POA: Diagnosis not present

## 2022-11-11 DIAGNOSIS — I480 Paroxysmal atrial fibrillation: Secondary | ICD-10-CM | POA: Diagnosis not present

## 2022-11-11 DIAGNOSIS — J9622 Acute and chronic respiratory failure with hypercapnia: Secondary | ICD-10-CM | POA: Diagnosis not present

## 2022-11-11 DIAGNOSIS — E1142 Type 2 diabetes mellitus with diabetic polyneuropathy: Secondary | ICD-10-CM | POA: Diagnosis not present

## 2022-11-11 DIAGNOSIS — I5033 Acute on chronic diastolic (congestive) heart failure: Secondary | ICD-10-CM | POA: Diagnosis not present

## 2022-11-16 ENCOUNTER — Inpatient Hospital Stay
Admission: EM | Admit: 2022-11-16 | Discharge: 2022-11-26 | DRG: 377 | Disposition: A | Discharge status: Skilled Nursing Facility | Payer: Medicare Other | Source: Ambulatory Visit | Attending: Internal Medicine | Admitting: Internal Medicine

## 2022-11-16 ENCOUNTER — Inpatient Hospital Stay: Payer: Medicare Other

## 2022-11-16 ENCOUNTER — Emergency Department: Payer: Medicare Other

## 2022-11-16 ENCOUNTER — Inpatient Hospital Stay (HOSPITAL_BASED_OUTPATIENT_CLINIC_OR_DEPARTMENT_OTHER): Payer: Medicare Other | Admitting: Hospice and Palliative Medicine

## 2022-11-16 ENCOUNTER — Encounter: Payer: Self-pay | Admitting: Hospice and Palliative Medicine

## 2022-11-16 ENCOUNTER — Other Ambulatory Visit: Payer: Self-pay

## 2022-11-16 VITALS — BP 86/46 | HR 57 | Temp 98.6°F | Resp 17 | Wt 233.0 lb

## 2022-11-16 DIAGNOSIS — R0602 Shortness of breath: Secondary | ICD-10-CM

## 2022-11-16 DIAGNOSIS — E873 Alkalosis: Secondary | ICD-10-CM | POA: Diagnosis not present

## 2022-11-16 DIAGNOSIS — J441 Chronic obstructive pulmonary disease with (acute) exacerbation: Secondary | ICD-10-CM | POA: Diagnosis not present

## 2022-11-16 DIAGNOSIS — I350 Nonrheumatic aortic (valve) stenosis: Secondary | ICD-10-CM | POA: Diagnosis present

## 2022-11-16 DIAGNOSIS — E870 Hyperosmolality and hypernatremia: Secondary | ICD-10-CM | POA: Diagnosis not present

## 2022-11-16 DIAGNOSIS — E1140 Type 2 diabetes mellitus with diabetic neuropathy, unspecified: Secondary | ICD-10-CM | POA: Diagnosis present

## 2022-11-16 DIAGNOSIS — G4733 Obstructive sleep apnea (adult) (pediatric): Secondary | ICD-10-CM | POA: Diagnosis present

## 2022-11-16 DIAGNOSIS — C3411 Malignant neoplasm of upper lobe, right bronchus or lung: Secondary | ICD-10-CM | POA: Diagnosis present

## 2022-11-16 DIAGNOSIS — E1122 Type 2 diabetes mellitus with diabetic chronic kidney disease: Secondary | ICD-10-CM | POA: Diagnosis present

## 2022-11-16 DIAGNOSIS — Z825 Family history of asthma and other chronic lower respiratory diseases: Secondary | ICD-10-CM

## 2022-11-16 DIAGNOSIS — Z888 Allergy status to other drugs, medicaments and biological substances status: Secondary | ICD-10-CM

## 2022-11-16 DIAGNOSIS — Z743 Need for continuous supervision: Secondary | ICD-10-CM | POA: Diagnosis not present

## 2022-11-16 DIAGNOSIS — D631 Anemia in chronic kidney disease: Secondary | ICD-10-CM | POA: Diagnosis not present

## 2022-11-16 DIAGNOSIS — J9622 Acute and chronic respiratory failure with hypercapnia: Secondary | ICD-10-CM | POA: Diagnosis not present

## 2022-11-16 DIAGNOSIS — I2510 Atherosclerotic heart disease of native coronary artery without angina pectoris: Secondary | ICD-10-CM | POA: Diagnosis present

## 2022-11-16 DIAGNOSIS — Z6841 Body Mass Index (BMI) 40.0 and over, adult: Secondary | ICD-10-CM

## 2022-11-16 DIAGNOSIS — F419 Anxiety disorder, unspecified: Secondary | ICD-10-CM | POA: Diagnosis present

## 2022-11-16 DIAGNOSIS — I517 Cardiomegaly: Secondary | ICD-10-CM | POA: Diagnosis not present

## 2022-11-16 DIAGNOSIS — J9611 Chronic respiratory failure with hypoxia: Secondary | ICD-10-CM | POA: Diagnosis present

## 2022-11-16 DIAGNOSIS — Z79890 Hormone replacement therapy (postmenopausal): Secondary | ICD-10-CM

## 2022-11-16 DIAGNOSIS — N179 Acute kidney failure, unspecified: Secondary | ICD-10-CM | POA: Diagnosis not present

## 2022-11-16 DIAGNOSIS — Z7951 Long term (current) use of inhaled steroids: Secondary | ICD-10-CM

## 2022-11-16 DIAGNOSIS — E1142 Type 2 diabetes mellitus with diabetic polyneuropathy: Secondary | ICD-10-CM | POA: Diagnosis present

## 2022-11-16 DIAGNOSIS — Z79899 Other long term (current) drug therapy: Secondary | ICD-10-CM

## 2022-11-16 DIAGNOSIS — N1832 Chronic kidney disease, stage 3b: Secondary | ICD-10-CM | POA: Diagnosis not present

## 2022-11-16 DIAGNOSIS — J9621 Acute and chronic respiratory failure with hypoxia: Secondary | ICD-10-CM | POA: Diagnosis not present

## 2022-11-16 DIAGNOSIS — Z1152 Encounter for screening for COVID-19: Secondary | ICD-10-CM | POA: Diagnosis not present

## 2022-11-16 DIAGNOSIS — I480 Paroxysmal atrial fibrillation: Secondary | ICD-10-CM | POA: Diagnosis not present

## 2022-11-16 DIAGNOSIS — Z87440 Personal history of urinary (tract) infections: Secondary | ICD-10-CM

## 2022-11-16 DIAGNOSIS — D649 Anemia, unspecified: Secondary | ICD-10-CM

## 2022-11-16 DIAGNOSIS — I5033 Acute on chronic diastolic (congestive) heart failure: Secondary | ICD-10-CM | POA: Diagnosis present

## 2022-11-16 DIAGNOSIS — E1165 Type 2 diabetes mellitus with hyperglycemia: Secondary | ICD-10-CM | POA: Diagnosis not present

## 2022-11-16 DIAGNOSIS — Z9049 Acquired absence of other specified parts of digestive tract: Secondary | ICD-10-CM

## 2022-11-16 DIAGNOSIS — I10 Essential (primary) hypertension: Secondary | ICD-10-CM | POA: Diagnosis present

## 2022-11-16 DIAGNOSIS — I130 Hypertensive heart and chronic kidney disease with heart failure and stage 1 through stage 4 chronic kidney disease, or unspecified chronic kidney disease: Secondary | ICD-10-CM | POA: Diagnosis present

## 2022-11-16 DIAGNOSIS — J4489 Other specified chronic obstructive pulmonary disease: Secondary | ICD-10-CM | POA: Diagnosis not present

## 2022-11-16 DIAGNOSIS — D62 Acute posthemorrhagic anemia: Principal | ICD-10-CM | POA: Diagnosis present

## 2022-11-16 DIAGNOSIS — E1151 Type 2 diabetes mellitus with diabetic peripheral angiopathy without gangrene: Secondary | ICD-10-CM | POA: Diagnosis present

## 2022-11-16 DIAGNOSIS — M6259 Muscle wasting and atrophy, not elsewhere classified, multiple sites: Secondary | ICD-10-CM | POA: Diagnosis not present

## 2022-11-16 DIAGNOSIS — I959 Hypotension, unspecified: Secondary | ICD-10-CM | POA: Diagnosis not present

## 2022-11-16 DIAGNOSIS — Z90710 Acquired absence of both cervix and uterus: Secondary | ICD-10-CM

## 2022-11-16 DIAGNOSIS — R410 Disorientation, unspecified: Secondary | ICD-10-CM | POA: Insufficient documentation

## 2022-11-16 DIAGNOSIS — D509 Iron deficiency anemia, unspecified: Secondary | ICD-10-CM | POA: Diagnosis not present

## 2022-11-16 DIAGNOSIS — Z87891 Personal history of nicotine dependence: Secondary | ICD-10-CM

## 2022-11-16 DIAGNOSIS — Z881 Allergy status to other antibiotic agents status: Secondary | ICD-10-CM

## 2022-11-16 DIAGNOSIS — D500 Iron deficiency anemia secondary to blood loss (chronic): Secondary | ICD-10-CM

## 2022-11-16 DIAGNOSIS — Z8249 Family history of ischemic heart disease and other diseases of the circulatory system: Secondary | ICD-10-CM

## 2022-11-16 DIAGNOSIS — I739 Peripheral vascular disease, unspecified: Secondary | ICD-10-CM | POA: Diagnosis present

## 2022-11-16 DIAGNOSIS — E039 Hypothyroidism, unspecified: Secondary | ICD-10-CM | POA: Diagnosis present

## 2022-11-16 DIAGNOSIS — Z8349 Family history of other endocrine, nutritional and metabolic diseases: Secondary | ICD-10-CM

## 2022-11-16 DIAGNOSIS — T502X5A Adverse effect of carbonic-anhydrase inhibitors, benzothiadiazides and other diuretics, initial encounter: Secondary | ICD-10-CM | POA: Diagnosis not present

## 2022-11-16 DIAGNOSIS — K922 Gastrointestinal hemorrhage, unspecified: Secondary | ICD-10-CM | POA: Diagnosis not present

## 2022-11-16 DIAGNOSIS — T380X5A Adverse effect of glucocorticoids and synthetic analogues, initial encounter: Secondary | ICD-10-CM | POA: Diagnosis not present

## 2022-11-16 DIAGNOSIS — Z7984 Long term (current) use of oral hypoglycemic drugs: Secondary | ICD-10-CM

## 2022-11-16 DIAGNOSIS — E538 Deficiency of other specified B group vitamins: Secondary | ICD-10-CM

## 2022-11-16 DIAGNOSIS — Z923 Personal history of irradiation: Secondary | ICD-10-CM

## 2022-11-16 DIAGNOSIS — K5731 Diverticulosis of large intestine without perforation or abscess with bleeding: Secondary | ICD-10-CM | POA: Diagnosis present

## 2022-11-16 DIAGNOSIS — E785 Hyperlipidemia, unspecified: Secondary | ICD-10-CM | POA: Diagnosis present

## 2022-11-16 DIAGNOSIS — M6281 Muscle weakness (generalized): Secondary | ICD-10-CM | POA: Diagnosis not present

## 2022-11-16 DIAGNOSIS — R0609 Other forms of dyspnea: Secondary | ICD-10-CM | POA: Diagnosis present

## 2022-11-16 DIAGNOSIS — C3490 Malignant neoplasm of unspecified part of unspecified bronchus or lung: Secondary | ICD-10-CM | POA: Diagnosis not present

## 2022-11-16 DIAGNOSIS — R2681 Unsteadiness on feet: Secondary | ICD-10-CM | POA: Diagnosis not present

## 2022-11-16 DIAGNOSIS — I5032 Chronic diastolic (congestive) heart failure: Secondary | ICD-10-CM | POA: Diagnosis not present

## 2022-11-16 DIAGNOSIS — Z9981 Dependence on supplemental oxygen: Secondary | ICD-10-CM

## 2022-11-16 DIAGNOSIS — G9349 Other encephalopathy: Secondary | ICD-10-CM | POA: Diagnosis not present

## 2022-11-16 DIAGNOSIS — I700 Atherosclerosis of aorta: Secondary | ICD-10-CM | POA: Diagnosis not present

## 2022-11-16 DIAGNOSIS — F339 Major depressive disorder, recurrent, unspecified: Secondary | ICD-10-CM | POA: Diagnosis not present

## 2022-11-16 DIAGNOSIS — R441 Visual hallucinations: Secondary | ICD-10-CM | POA: Diagnosis not present

## 2022-11-16 DIAGNOSIS — Z85118 Personal history of other malignant neoplasm of bronchus and lung: Secondary | ICD-10-CM

## 2022-11-16 DIAGNOSIS — K5900 Constipation, unspecified: Secondary | ICD-10-CM | POA: Diagnosis present

## 2022-11-16 DIAGNOSIS — K921 Melena: Secondary | ICD-10-CM | POA: Diagnosis not present

## 2022-11-16 DIAGNOSIS — J449 Chronic obstructive pulmonary disease, unspecified: Secondary | ICD-10-CM | POA: Diagnosis not present

## 2022-11-16 DIAGNOSIS — U071 COVID-19: Secondary | ICD-10-CM | POA: Diagnosis not present

## 2022-11-16 DIAGNOSIS — K219 Gastro-esophageal reflux disease without esophagitis: Secondary | ICD-10-CM | POA: Diagnosis present

## 2022-11-16 DIAGNOSIS — J439 Emphysema, unspecified: Secondary | ICD-10-CM | POA: Diagnosis present

## 2022-11-16 DIAGNOSIS — Z882 Allergy status to sulfonamides status: Secondary | ICD-10-CM

## 2022-11-16 DIAGNOSIS — K5730 Diverticulosis of large intestine without perforation or abscess without bleeding: Secondary | ICD-10-CM | POA: Diagnosis not present

## 2022-11-16 DIAGNOSIS — D1803 Hemangioma of intra-abdominal structures: Secondary | ICD-10-CM | POA: Diagnosis not present

## 2022-11-16 DIAGNOSIS — Z885 Allergy status to narcotic agent status: Secondary | ICD-10-CM

## 2022-11-16 LAB — CBC
HCT: 24.7 % — ABNORMAL LOW (ref 36.0–46.0)
Hemoglobin: 7.4 g/dL — ABNORMAL LOW (ref 12.0–15.0)
MCH: 32 pg (ref 26.0–34.0)
MCHC: 30 g/dL (ref 30.0–36.0)
MCV: 106.9 fL — ABNORMAL HIGH (ref 80.0–100.0)
Platelets: 198 10*3/uL (ref 150–400)
RBC: 2.31 MIL/uL — ABNORMAL LOW (ref 3.87–5.11)
RDW: 16.6 % — ABNORMAL HIGH (ref 11.5–15.5)
WBC: 6.9 10*3/uL (ref 4.0–10.5)
nRBC: 0.6 % — ABNORMAL HIGH (ref 0.0–0.2)

## 2022-11-16 LAB — IRON AND TIBC
Iron: 66 ug/dL (ref 28–170)
Saturation Ratios: 19 % (ref 10.4–31.8)
TIBC: 354 ug/dL (ref 250–450)
UIBC: 288 ug/dL

## 2022-11-16 LAB — TROPONIN I (HIGH SENSITIVITY)
Troponin I (High Sensitivity): 9 ng/L (ref <18)
Troponin I (High Sensitivity): 10 ng/L (ref <18)

## 2022-11-16 LAB — SAMPLE TO BLOOD BANK

## 2022-11-16 LAB — BASIC METABOLIC PANEL
Anion gap: 7 (ref 5–15)
BUN: 53 mg/dL — ABNORMAL HIGH (ref 8–23)
CO2: 37 mmol/L — ABNORMAL HIGH (ref 22–32)
Calcium: 8.4 mg/dL — ABNORMAL LOW (ref 8.9–10.3)
Chloride: 97 mmol/L — ABNORMAL LOW (ref 98–111)
Creatinine, Ser: 1.43 mg/dL — ABNORMAL HIGH (ref 0.44–1.00)
GFR, Estimated: 36 mL/min — ABNORMAL LOW (ref >60)
Glucose, Bld: 200 mg/dL — ABNORMAL HIGH (ref 70–99)
Potassium: 4.6 mmol/L (ref 3.5–5.1)
Sodium: 141 mmol/L (ref 135–145)

## 2022-11-16 LAB — CBC WITH DIFFERENTIAL/PLATELET
Abs Immature Granulocytes: 0.06 K/uL (ref 0.00–0.07)
Basophils Absolute: 0 K/uL (ref 0.0–0.1)
Basophils Relative: 0 %
Eosinophils Absolute: 0.1 K/uL (ref 0.0–0.5)
Eosinophils Relative: 2 %
HCT: 24.7 % — ABNORMAL LOW (ref 36.0–46.0)
Hemoglobin: 7.2 g/dL — ABNORMAL LOW (ref 12.0–15.0)
Immature Granulocytes: 1 %
Lymphocytes Relative: 15 %
Lymphs Abs: 0.9 K/uL (ref 0.7–4.0)
MCH: 31.2 pg (ref 26.0–34.0)
MCHC: 29.1 g/dL — ABNORMAL LOW (ref 30.0–36.0)
MCV: 106.9 fL — ABNORMAL HIGH (ref 80.0–100.0)
Monocytes Absolute: 0.4 K/uL (ref 0.1–1.0)
Monocytes Relative: 7 %
Neutro Abs: 4.8 K/uL (ref 1.7–7.7)
Neutrophils Relative %: 75 %
Platelets: 197 K/uL (ref 150–400)
RBC: 2.31 MIL/uL — ABNORMAL LOW (ref 3.87–5.11)
RDW: 16.5 % — ABNORMAL HIGH (ref 11.5–15.5)
WBC: 6.3 K/uL (ref 4.0–10.5)
nRBC: 0.8 % — ABNORMAL HIGH (ref 0.0–0.2)

## 2022-11-16 LAB — BRAIN NATRIURETIC PEPTIDE: B Natriuretic Peptide: 155.4 pg/mL — ABNORMAL HIGH (ref 0.0–100.0)

## 2022-11-16 LAB — PREPARE RBC (CROSSMATCH)

## 2022-11-16 LAB — FERRITIN: Ferritin: 77 ng/mL (ref 11–307)

## 2022-11-16 MED ORDER — ALBUTEROL SULFATE (2.5 MG/3ML) 0.083% IN NEBU
2.5000 mg | INHALATION_SOLUTION | Freq: Four times a day (QID) | RESPIRATORY_TRACT | Status: DC | PRN
  Administered 2022-11-18: 2.5 mg via RESPIRATORY_TRACT
  Filled 2022-11-16: qty 3

## 2022-11-16 MED ORDER — MAGNESIUM OXIDE -MG SUPPLEMENT 400 (240 MG) MG PO TABS
200.0000 mg | ORAL_TABLET | Freq: Two times a day (BID) | ORAL | Status: DC
  Administered 2022-11-17 – 2022-11-18 (×3): 200 mg via ORAL
  Filled 2022-11-16 (×3): qty 1

## 2022-11-16 MED ORDER — SODIUM CHLORIDE 0.9 % IV SOLN
10.0000 mL/h | Freq: Once | INTRAVENOUS | Status: AC
  Administered 2022-11-16: 10 mL/h via INTRAVENOUS

## 2022-11-16 MED ORDER — INSULIN ASPART 100 UNIT/ML IJ SOLN
0.0000 [IU] | Freq: Three times a day (TID) | INTRAMUSCULAR | Status: DC
  Administered 2022-11-17: 20 [IU] via SUBCUTANEOUS
  Administered 2022-11-17: 3 [IU] via SUBCUTANEOUS
  Administered 2022-11-18: 4 [IU] via SUBCUTANEOUS
  Administered 2022-11-18: 7 [IU] via SUBCUTANEOUS
  Administered 2022-11-18: 4 [IU] via SUBCUTANEOUS
  Administered 2022-11-19: 11 [IU] via SUBCUTANEOUS
  Administered 2022-11-19: 4 [IU] via SUBCUTANEOUS
  Administered 2022-11-19: 3 [IU] via SUBCUTANEOUS
  Administered 2022-11-20 (×2): 7 [IU] via SUBCUTANEOUS
  Administered 2022-11-21: 4 [IU] via SUBCUTANEOUS
  Administered 2022-11-21: 3 [IU] via SUBCUTANEOUS
  Administered 2022-11-21 – 2022-11-23 (×4): 4 [IU] via SUBCUTANEOUS
  Administered 2022-11-23: 7 [IU] via SUBCUTANEOUS
  Administered 2022-11-24: 4 [IU] via SUBCUTANEOUS
  Administered 2022-11-24: 11 [IU] via SUBCUTANEOUS
  Filled 2022-11-16 (×18): qty 1

## 2022-11-16 MED ORDER — ISOSORBIDE MONONITRATE ER 30 MG PO TB24
30.0000 mg | ORAL_TABLET | Freq: Every day | ORAL | Status: DC
  Administered 2022-11-17 – 2022-11-18 (×2): 30 mg via ORAL
  Filled 2022-11-16 (×2): qty 1

## 2022-11-16 MED ORDER — FERROUS SULFATE 325 (65 FE) MG PO TABS
325.0000 mg | ORAL_TABLET | Freq: Every day | ORAL | Status: DC
  Administered 2022-11-17 – 2022-11-26 (×10): 325 mg via ORAL
  Filled 2022-11-16 (×10): qty 1

## 2022-11-16 MED ORDER — LEVOTHYROXINE SODIUM 50 MCG PO TABS
125.0000 ug | ORAL_TABLET | Freq: Every day | ORAL | Status: DC
  Administered 2022-11-17 – 2022-11-26 (×10): 125 ug via ORAL
  Filled 2022-11-16 (×10): qty 1

## 2022-11-16 MED ORDER — TORSEMIDE 20 MG PO TABS
40.0000 mg | ORAL_TABLET | Freq: Every day | ORAL | Status: DC
  Filled 2022-11-16: qty 2

## 2022-11-16 MED ORDER — VITAMIN D 25 MCG (1000 UNIT) PO TABS
2000.0000 [IU] | ORAL_TABLET | Freq: Every day | ORAL | Status: DC
  Administered 2022-11-17 – 2022-11-26 (×10): 2000 [IU] via ORAL
  Filled 2022-11-16 (×10): qty 2

## 2022-11-16 MED ORDER — IOHEXOL 300 MG/ML  SOLN
80.0000 mL | Freq: Once | INTRAMUSCULAR | Status: AC | PRN
  Administered 2022-11-16: 80 mL via INTRAVENOUS

## 2022-11-16 MED ORDER — ONDANSETRON HCL 4 MG PO TABS: 4.0000 mg | ORAL_TABLET | Freq: Four times a day (QID) | ORAL | Status: DC | PRN

## 2022-11-16 MED ORDER — BUDESONIDE 0.5 MG/2ML IN SUSP
0.5000 mg | Freq: Two times a day (BID) | RESPIRATORY_TRACT | Status: DC
  Filled 2022-11-16: qty 2

## 2022-11-16 MED ORDER — PANTOPRAZOLE SODIUM 40 MG PO TBEC
40.0000 mg | DELAYED_RELEASE_TABLET | Freq: Every day | ORAL | Status: DC
  Filled 2022-11-16: qty 1

## 2022-11-16 MED ORDER — BISOPROLOL FUMARATE 5 MG PO TABS
2.5000 mg | ORAL_TABLET | Freq: Every day | ORAL | Status: DC
  Administered 2022-11-18 – 2022-11-26 (×9): 2.5 mg via ORAL
  Filled 2022-11-16 (×10): qty 0.5

## 2022-11-16 MED ORDER — INSULIN ASPART 100 UNIT/ML IJ SOLN
0.0000 [IU] | Freq: Every day | INTRAMUSCULAR | Status: DC
  Administered 2022-11-17 – 2022-11-18 (×2): 3 [IU] via SUBCUTANEOUS
  Administered 2022-11-19: 2 [IU] via SUBCUTANEOUS
  Administered 2022-11-20: 4 [IU] via SUBCUTANEOUS
  Administered 2022-11-21 – 2022-11-23 (×2): 3 [IU] via SUBCUTANEOUS
  Administered 2022-11-24: 2 [IU] via SUBCUTANEOUS
  Filled 2022-11-16 (×7): qty 1

## 2022-11-16 MED ORDER — VITAMIN B-12 1000 MCG PO TABS
5000.0000 ug | ORAL_TABLET | Freq: Every day | ORAL | Status: DC
  Administered 2022-11-17 – 2022-11-26 (×10): 5000 ug via ORAL
  Filled 2022-11-16 (×10): qty 5

## 2022-11-16 MED ORDER — IPRATROPIUM-ALBUTEROL 0.5-2.5 (3) MG/3ML IN SOLN
3.0000 mL | Freq: Once | RESPIRATORY_TRACT | Status: AC
  Administered 2022-11-16: 3 mL via RESPIRATORY_TRACT
  Filled 2022-11-16: qty 3

## 2022-11-16 MED ORDER — NITROGLYCERIN 0.4 MG SL SUBL: 0.4000 mg | SUBLINGUAL_TABLET | SUBLINGUAL | Status: DC | PRN

## 2022-11-16 MED ORDER — POTASSIUM CHLORIDE CRYS ER 10 MEQ PO TBCR
10.0000 meq | EXTENDED_RELEASE_TABLET | Freq: Every day | ORAL | Status: DC
  Administered 2022-11-17 – 2022-11-20 (×4): 10 meq via ORAL
  Filled 2022-11-16 (×4): qty 1

## 2022-11-16 MED ORDER — ACETAMINOPHEN 325 MG PO TABS
650.0000 mg | ORAL_TABLET | Freq: Four times a day (QID) | ORAL | Status: DC | PRN
  Administered 2022-11-23 – 2022-11-26 (×3): 650 mg via ORAL
  Filled 2022-11-16 (×4): qty 2

## 2022-11-16 MED ORDER — ONDANSETRON HCL 4 MG/2ML IJ SOLN: 4.0000 mg | Freq: Four times a day (QID) | INTRAMUSCULAR | Status: DC | PRN

## 2022-11-16 MED ORDER — ACETAMINOPHEN 650 MG RE SUPP: 650.0000 mg | Freq: Four times a day (QID) | RECTAL | Status: DC | PRN

## 2022-11-16 MED ORDER — IPRATROPIUM-ALBUTEROL 0.5-2.5 (3) MG/3ML IN SOLN
3.0000 mL | Freq: Four times a day (QID) | RESPIRATORY_TRACT | Status: DC | PRN
  Filled 2022-11-16: qty 3

## 2022-11-16 MED ORDER — ROSUVASTATIN CALCIUM 10 MG PO TABS
10.0000 mg | ORAL_TABLET | ORAL | Status: DC
  Administered 2022-11-18 – 2022-11-26 (×5): 10 mg via ORAL
  Filled 2022-11-16 (×5): qty 1

## 2022-11-16 NOTE — Assessment & Plan Note (Signed)
Complicating factor to overall prognosis and care 

## 2022-11-16 NOTE — H&P (Signed)
History and Physical    Patient: Alexandria [name] DOB: Jul 15, 1935 DOA: 11/16/2022 DOS: the patient was seen and examined on 11/16/2022 PCP: Joaquim Nam, MD  Patient coming from: Home  Chief Complaint:  Chief Complaint  Patient presents with   Anemia    HPI: Alexandria [name] is a 87 y.o. female with medical history significant for COPD on home O2 at 3 L, HFpEF (EF 60 to 65% 06/2022), CKD 3B, diabetes, OSA not on CPAP,  stage l NSCLC s/p radiation, (2021), chronic iron deficiency anemia for several years with upper and lower endoscopy in 2017 showing hemorrhoids and diverticulosis without hemorrhage, who was sent by the cancer center for workup of anemia.  Patient complained of a several day history of worsening shortness of breath beyond her baseline as well as fatigue.  Hemoglobin checked at PCPs office showed hemoglobin of 7.4 down from baseline of 10 just 3 weeks prior.  Patient endorses dark but not black stool.  She has had no vomiting.  Denies chest pain.   On review of patient's records, patient was last seen by [facility] gastroenterology on 08/2022.  Capsule endoscopy was recommended however daughter declined due to risk of procedure if positive.  It is noted that in 2023 she was not considered for further endoscopic evaluation as it posed more risk than benefit.  The plan from GI was to continue monitoring with iron infusion/transfusion as needed patient has required IV iron with the occasional blood transfusion, with last transfusion 07/08/22. ED course and data reviewed.  Vitals within normal limits.  Heart rate in the 50s. Labs:Hemoglobin 7.4, down from 10.1 about 3 weeks prior Glucose 200 Creatinine at baseline at 1.43 EKG, personally viewed and interpreted showed sinus bradycardia at 56 with nonspecific ST-T wave changes. Chest x-ray with no acute lung process CT abdomen and pelvis showing extensive colonic diverticulosis among other findings as  follows: IMPRESSION: Extensive colonic diverticulosis. Normal appendix. No bowel obstruction, free air or free fluid.  There is collapsed stomach but gastric fold thickening. Please correlate with symptoms.  Bilateral renal cystic foci. There is 1 potentially enhancing lesion in the upper pole left kidney measuring 19 mm. Recommend dedicated MRI evaluation when appropriate to further assess and exclude underlying aggressive lesion.  Known hepatic hemangiomas.  Patient was transfused a unit of PRBCs in the ED however continued to be dyspneic. She was treated with DuoNebs. Hospitalist consulted for admission for overnight observation to monitor hemoglobin with additional transfusions if needed.   Review of Systems: As mentioned in the history of present illness. All other systems reviewed and are negative.  Past Medical History:  Diagnosis Date   Allergy, unspecified not elsewhere classified    Anxiety    Aortic stenosis, mild 03/30/2022   a. TTE 03/29/2022: EF 55-60, no RWMA, GR 1 DD, normal RVSF, mild aortic stenosis (mean 10, V-max 212 cm/s, DI 0.70)   Cataract    Chronic diastolic congestive heart failure (HCC)    a. 06/2022 Echo: EF 60-65%, no rwma, nl RV fxn, mild MR, mild AS.   COPD (chronic obstructive pulmonary disease) (HCC) 03/08/1998   PFTs 12/12/2002 FEV 1 1.42 (64%) ratio 58 with no better after B2 and DLCO75%; PFTs 11/20/09 FEV1 1.50 (73%) ratio 50 no better after B2 with DLCO 62%; Hfa 50% 11/20/2009 >75%, 01/13/10 p coaching   Coronary artery calcification seen on CT scan    a. 2012 Neg stress test.   Depression 12/07/1998   Diabetes mellitus type  II 02/05/2006   Dr. Elvera Lennox with endo   Disorders of bursae and tendons in shoulder region, unspecified    Rotator cuff syndrome, right   E. coli bacteremia    Esophagitis    GERD (gastroesophageal reflux disease)    History of UTI    Hyperlipidemia 10/04/2000   Hypertension 03/08/1992   Hypothyroidism 03/08/1968   Iron  deficiency anemia    Lung nodule    radiation starts 10-08-2019   Malignant neoplasm of right upper lobe of lung (HCC) 07/24/2019   Obesity    NOS   OSA (obstructive sleep apnea)    PSG 01/27/10 AHI 13, pt does not know CPAP settings   PAF (paroxysmal atrial fibrillation) (HCC)    a. 06/2022 in setting of UTI/encephalopathy-->converted on amio; b. CHA2DS2VASc = 6-->No OAC 2/2 chronic anemia/fall risk; c. 06/2022 Zio: Predominantly sinus bradycardia at 54 (3-158)'s.  1 brief run of SVT.  2.2% PVC burden.   Peripheral neuropathy    Likely due to DM per Dr. Tresa Endo hernia    Past Surgical History:  Procedure Laterality Date   ABD U/S  03/19/1999   Nml x2 foci in liver   ADENOSINE MYOVIEW  06/02/2007   Nml   CARDIOLITE PERSANTINE  08/24/2000   Nml   CAROTID U/S  08/24/2000   1-39% ICA stenosis   CAROTID U/S  06/02/2007   No apprec change    CARPAL TUNNEL RELEASE  12/1997   Right   CESAREAN SECTION     x2 Breech/ repeat   CHOLECYSTECTOMY  1997   COLONOSCOPY WITH PROPOFOL N/A 02/12/2016   Procedure: COLONOSCOPY WITH PROPOFOL;  Surgeon: Rachael Fee, MD;  Location: [facility];  Service: Endoscopy;  Laterality: N/A;   CT ABD W & PELVIS WO/W CM     Abd hemangiomas of liver, 1 cm R renal cyst   DENTAL SURGERY  2016   Implants   DEXA  07/03/2003   Nml   ESOPHAGOGASTRODUODENOSCOPY  12/05/1997   Nml (due to hoarseness)   ESOPHAGOGASTRODUODENOSCOPY (EGD) WITH PROPOFOL N/A 02/12/2016   Procedure: ESOPHAGOGASTRODUODENOSCOPY (EGD) WITH PROPOFOL;  Surgeon: Rachael Fee, MD;  Location: [facility];  Service: Endoscopy;  Laterality: N/A;   GALLBLADDER SURGERY     HERNIA REPAIR  01/24/2009   Lap Ventr w/ Lysis of adhesions (Dr. Dwain Sarna)   knee arthroscopic surgery  years ago   right   ROTATOR CUFF REPAIR  1984   Right, [place]   SHOULDER OPEN ROTATOR CUFF REPAIR  02/08/2012   Procedure: ROTATOR CUFF REPAIR SHOULDER OPEN;  Surgeon: Drucilla Schmidt, MD;  Location: [facility];   Service: Orthopedics;  Laterality: Left;  Left Shoulder Open Anterior Acrominectomy Rotator Cuff Repair Open Distal Clavicle Resection ,tissue mend graft, and repair of biceps tendon   THUMB RELEASE  12/1997   Right   TONSILLECTOMY     TOTAL ABDOMINAL HYSTERECTOMY  1985   Due to dysmennorhea   US ECHOCARDIOGRAPHY  06/02/2007   Social History:  reports that she quit smoking about 17 years ago. Her smoking use included cigarettes. She started smoking about 67 years ago. She has a 100 pack-year smoking history. She has never used smokeless tobacco. She reports current alcohol use. She reports that she does not use drugs.  Allergies  Allergen Reactions   Antihistamines, Diphenhydramine-Type Other (See Comments)    Able to tolerate only allegra.    Incruse Ellipta [Umeclidinium Bromide] Cough    Caused voice change and severe coughing  Clarithromycin Other (See Comments)    REACTION: diff swallowing and mouth blisters   Codeine Other (See Comments)    Unknown    Diphenhydramine    Pregabalin Other (See Comments)    Lack of effect for neuropathy pain.   Sulfonamide Derivatives    Tiotropium Bromide Monohydrate Other (See Comments)    Voice changes   Umeclidinium    Vraylar [Cariprazine]     intolerant    Family History  Problem Relation Age of Onset   Heart disease Mother    Thyroid disease Mother    Emphysema Father        One lung   Cystic fibrosis Sister    Hyperthyroidism Sister    Osteoarthritis Brother    Hyperthyroidism Brother    Esophageal cancer Neg Hx    Cancer Neg Hx        Head or neck   Colon cancer Neg Hx    Stomach cancer Neg Hx    Breast cancer Neg Hx     Prior to Admission medications   Medication Sig Start Date End Date Taking? Authorizing Provider  acetaminophen (TYLENOL) 325 MG tablet Take 2 tablets (650 mg total) by mouth every 6 (six) hours as needed for mild pain, headache, fever or moderate pain. 07/07/22   Gillis Santa, MD  albuterol (PROAIR  HFA) 108 (90 Base) MCG/ACT inhaler Inhale 2 puffs into the lungs every 6 (six) hours as needed for wheezing or shortness of breath. INHALE 2 PUFFS INTO THE LUNGS EVERY 6 HOURS AS NEEDED FOR WHEEZING OR SHORTNESS OF BREATH 04/19/18   Coralyn Helling, MD  amiodarone (PACERONE) 200 MG tablet TAKE 1 TABLET BY MOUTH EVERY DAY 10/26/22   Creig Hines, NP  arformoterol (BROVANA) 15 MCG/2ML NEBU Take 2 mLs (15 mcg total) by nebulization at bedtime. 12/01/21   Coralyn Helling, MD  bisoprolol (ZEBETA) 5 MG tablet Take 0.5 tablets (2.5 mg total) by mouth daily. 08/06/22   Creig Hines, NP  budesonide (PULMICORT) 0.5 MG/2ML nebulizer solution Take 2 mLs (0.5 mg total) by nebulization in the morning and at bedtime. 10/22/22   Cobb, Ruby Cola, NP  busPIRone (BUSPAR) 15 MG tablet Take 15 mg by mouth 3 (three) times daily.    [provider]  Cholecalciferol (VITAMIN D) 50 MCG (2000 UT) tablet Take 1 tablet (2,000 Units total) by mouth daily. 06/27/18   Joaquim Nam, MD  Coenzyme Q-10 200 MG CAPS Take 200 mg by mouth daily.    [provider]  Cyanocobalamin (B-12) 5000 MCG CAPS Take 5,000 mcg by mouth daily.    [provider]  esomeprazole (NEXIUM) 40 MG capsule TAKE 1 CAPSULE (40 MG TOTAL) BY MOUTH DAILY AT 12 NOON. 10/26/22   Jenel Lucks, MD  ezetimibe (ZETIA) 10 MG tablet TAKE 1 TABLET BY MOUTH EVERYDAY AT BEDTIME 10/27/22   Joaquim Nam, MD  ferrous sulfate 325 (65 FE) MG tablet Take 1 tablet (325 mg total) by mouth daily. 09/15/22   Briant Cedar, PA-C  FLUoxetine (PROZAC) 20 MG tablet Take 20 mg by mouth 2 (two) times daily.    [provider]  glucose blood (ONETOUCH VERIO) test strip USE AS INSTRUCTED TO CHECK SUGAR 1 TIME DAILY 03/16/22   Carlus Pavlov, MD  guaiFENesin (MUCINEX) 600 MG 12 hr tablet Take 2 tablets (1,200 mg total) by mouth 2 (two) times daily as needed for cough or to loosen phlegm. 08/25/21   Coralyn Helling, MD  isosorbide  mononitrate (IMDUR) 30 MG 24 hr tablet Take 30 mg by mouth daily. 08/07/22   [provider]  levothyroxine (SYNTHROID) 125 MCG tablet Take 1 tablet (125 mcg total) by mouth daily. 09/14/22   Carlus Pavlov, MD  magnesium 30 MG tablet Take 1 tablet (30 mg total) by mouth 2 (two) times daily. 08/23/22   Antonieta Iba, MD  melatonin 5 MG TABS Take 1 tablet (5 mg total) by mouth at bedtime. Patient not taking: Reported on 10/21/2022 07/07/22 07/07/23  Gillis Santa, MD  metFORMIN (GLUCOPHAGE) 500 MG tablet Take 1 tablet (500 mg total) by mouth every evening. 09/14/22   Carlus Pavlov, MD  nitroGLYCERIN (NITROSTAT) 0.4 MG SL tablet Place 1 tablet (0.4 mg total) under the tongue every 5 (five) minutes as needed for chest pain. 07/16/21   Joaquim Nam, MD  nystatin (MYCOSTATIN) 100000 UNIT/ML suspension Take 5 mLs (500,000 Units total) by mouth 4 (four) times daily. Patient not taking: Reported on 10/21/2022 05/11/22   Joaquim Nam, MD  OneTouch Delica Lancets 33G MISC Use 1x a day 03/16/22   Carlus Pavlov, MD  OXYGEN Inhale into the lungs. 2lpm    [provider]  potassium chloride (KLOR-CON) 10 MEQ tablet TAKE 1 TABLET BY MOUTH EVERY DAY 10/26/22   Antonieta Iba, MD  rosuvastatin (CRESTOR) 10 MG tablet TAKE 1 TABLET BY MOUTH EVERY OTHER DAY 07/30/22   Joaquim Nam, MD  Spacer/Aero Chamber Mouthpiece MISC Use with  inhaler as needed.  J44.9 09/30/17   Joaquim Nam, MD  torsemide (DEMADEX) 20 MG tablet Take 2 tablets (40 mg total) by mouth daily. Take an additional 20 mg daily as needed for weight greater then 230 pounds 08/24/22 11/22/22  Antonieta Iba, MD  Zinc Oxide (TRIPLE PASTE) 12.8 % ointment Apply 1 Application topically as needed for irritation. Every shift. Patient not taking: Reported on 10/21/2022    [provider]    Physical Exam: Vitals:   11/16/22 2040 11/16/22 2046 11/16/22 2100 11/16/22 2115  BP:   (!) 143/92   Pulse: (!) 52 (!) 52 (!) 52  (!) 53  Resp: 18 17 19 17   Temp:      TempSrc:      SpO2: 100% 100% 100% 100%  Weight:       Physical Exam Vitals and nursing note reviewed.  Constitutional:      General: She is not in acute distress. HENT:     Head: Normocephalic and atraumatic.  Cardiovascular:     Rate and Rhythm: Regular rhythm. Bradycardia present.     Heart sounds: Normal heart sounds.  Pulmonary:     Effort: Pulmonary effort is normal.     Breath sounds: Normal breath sounds.  Abdominal:     Palpations: Abdomen is soft.     Tenderness: There is no abdominal tenderness.  Neurological:     Mental Status: Mental status is at baseline.     Labs on Admission: I have personally reviewed following labs and imaging studies  CBC: Recent Labs  Lab 11/16/22 1401 11/16/22 1524  WBC 6.3 6.9  NEUTROABS 4.8  --   HGB 7.2* 7.4*  HCT 24.7* 24.7*  MCV 106.9* 106.9*  PLT 197 198   Basic Metabolic Panel: Recent Labs  Lab 11/16/22 1524  NA 141  K 4.6  CL 97*  CO2 37*  GLUCOSE 200*  BUN 53*  CREATININE 1.43*  CALCIUM 8.4*   GFR: Estimated Creatinine Clearance: 34.7 mL/min (A) (  by C-G formula based on SCr of 1.43 mg/dL (H)). Liver Function Tests: No results for input(s): "AST", "ALT", "ALKPHOS", "BILITOT", "PROT", "ALBUMIN" in the last 168 hours. No results for input(s): "LIPASE", "AMYLASE" in the last 168 hours. No results for input(s): "AMMONIA" in the last 168 hours. Coagulation Profile: No results for input(s): "INR", "PROTIME" in the last 168 hours. Cardiac Enzymes: No results for input(s): "CKTOTAL", "CKMB", "CKMBINDEX", "TROPONINI" in the last 168 hours. BNP (last 3 results) Recent Labs    04/22/22 1624  PROBNP 69.0   HbA1C: No results for input(s): "HGBA1C" in the last 72 hours. CBG: No results for input(s): "GLUCAP" in the last 168 hours. Lipid Profile: No results for input(s): "CHOL", "HDL", "LDLCALC", "TRIG", "CHOLHDL", "LDLDIRECT" in the last 72 hours. Thyroid Function  Tests: No results for input(s): "TSH", "T4TOTAL", "FREET4", "T3FREE", "THYROIDAB" in the last 72 hours. Anemia Panel: Recent Labs    11/16/22 1401  FERRITIN 77  TIBC 354  IRON 66   Urine analysis:    Component Value Date/Time   COLORURINE YELLOW (A) 06/29/2022 1735   APPEARANCEUR HAZY (A) 06/29/2022 1735   LABSPEC 1.005 06/29/2022 1735   PHURINE 6.0 06/29/2022 1735   GLUCOSEU NEGATIVE 06/29/2022 1735   GLUCOSEU NEGATIVE 08/30/2019 0919   HGBUR MODERATE (A) 06/29/2022 1735   BILIRUBINUR negative 09/23/2022 1444   BILIRUBINUR Negative 11/12/2021 1600   KETONESUR negative 09/23/2022 1444   KETONESUR NEGATIVE 06/29/2022 1735   PROTEINUR negative 09/23/2022 1444   PROTEINUR 30 (A) 06/29/2022 1735   UROBILINOGEN 0.2 09/23/2022 1444   UROBILINOGEN 0.2 08/30/2019 0919   NITRITE Negative 09/23/2022 1444   NITRITE NEGATIVE 06/29/2022 1735   LEUKOCYTESUR Trace (A) 09/23/2022 1444   LEUKOCYTESUR LARGE (A) 06/29/2022 1735    Radiological Exams on Admission: CT ABDOMEN PELVIS W CONTRAST  Result Date: 11/16/2022 CLINICAL DATA:  Right-sided abdominal pain. Known hernia with known cancer. Non-small-cell lung cancer. * Tracking Code: BO * EXAM: CT ABDOMEN AND PELVIS WITH CONTRAST TECHNIQUE: Multidetector CT imaging of the abdomen and pelvis was performed using the standard protocol following bolus administration of intravenous contrast. RADIATION DOSE REDUCTION: This exam was performed according to the departmental dose-optimization program which includes automated exposure control, adjustment of the mA and/or kV according to patient size and/or use of iterative reconstruction technique. CONTRAST:  80mL OMNIPAQUE IOHEXOL 300 MG/ML  SOLN COMPARISON:  Abdomen and pelvis CT 03/17/2021. Chest CT scan 09/17/2022 FINDINGS: Lower chest: There is some basilar atelectasis. No pleural effusion. There is a 3 mm nodule medial left lower lobe on series 7, image 5. Calcifications along the mitral valve annulus.  No pericardial effusion. Coronary artery calcifications are seen. Hepatobiliary: Once again there are hepatic hemangiomas in segment 8 and 2 as on previous. Previous cholecystectomy. Patent portal vein. Pancreas: Moderate atrophy of the pancreas. Spleen: Normal in size without focal abnormality. Adrenals/Urinary Tract: The adrenal glands are preserved. No collecting system dilatation. The ureters have normal course and caliber extending down to the bladder. Preserved contours of the urinary bladder. Once again the kidneys has some Bosniak 1 renal cysts. Largest is seen exophytic from the anterior aspect of the right kidney measuring 3.5 cm. Hounsfield unit of 15. However there is 1 lesion along the upper aspect of the left kidney laterally which is more complex. Precontrast density of 37 38, portal venous phase 52 and delayed with a somewhat thick potentially enhancing wall and Hounsfield units of 65. Possible solid mass or renal cell carcinoma. Recommend further workup when appropriate.  No abnormal calcifications are seen within either kidney nor along the course of either ureter on the noncontrast dataset. Stomach/Bowel: Stomach is underdistended on this non oral contrast exam. Question fold thickening. Please correlate with symptoms. The small bowel is nondilated. There is extensive colonic diverticulosis without dilatation or obstruction. Normal appendix seen retrocecal. Vascular/Lymphatic: Extensive vascular calcifications along the aorta and branch vessels. Normal caliber aorta and IVC. No discrete abnormal lymph node enlargement identified in the abdomen and pelvis. Reproductive: Status post hysterectomy. No adnexal masses. Other: No free air or free fluid. There is mesh along the anterior abdominal wall. Musculoskeletal: Osteopenia. Scattered degenerative changes. Trace anterolisthesis of L5 on S1. Significant degenerative changes as well of the pelvis. IMPRESSION: Extensive colonic diverticulosis. Normal  appendix. No bowel obstruction, free air or free fluid. There is collapsed stomach but gastric fold thickening. Please correlate with symptoms. Bilateral renal cystic foci. There is 1 potentially enhancing lesion in the upper pole left kidney measuring 19 mm. Recommend dedicated MRI evaluation when appropriate to further assess and exclude underlying aggressive lesion. Known hepatic hemangiomas. Electronically Signed   By: Karen Kays M.D.   On: 11/16/2022 19:33   DG Chest 2 View  Result Date: 11/16/2022 CLINICAL DATA:  Shortness of breath. EXAM: CHEST - 2 VIEW COMPARISON:  Chest radiographs 07/05/2022, 06/29/2022; CT chest 09/17/2022 and 03/19/2022 FINDINGS: Cardiac silhouette is again mildly to mildly enlarged. Moderate atherosclerotic calcification is again seen within the aortic arch. There is again flattening of the diaphragms and mild hyperinflation. Unchanged opacity overlying the superomedial right lung corresponding to the chronic scarring seen on prior 09/17/2022 and 03/19/2022 CTs. Mild blunting of the posterior costophrenic angle is similar to 03/04/2022 and favored represent mild scarring. No definite pleural effusion. No pneumothorax. Moderate multilevel degenerative disc changes of the thoracic spine. IMPRESSION: Mild chronic hyperinflation.  No acute lung process. Electronically Signed   By: Neita Garnet M.D.   On: 11/16/2022 17:02     Data Reviewed: Relevant notes from primary care and specialist visits, past discharge summaries as available in EHR, including Care Everywhere. Prior diagnostic testing as pertinent to current admission diagnoses Updated medications and problem lists for reconciliation ED course, including vitals, labs, imaging, treatment and response to treatment Triage notes, nursing and pharmacy notes and ED provider's notes Notable results as noted in HPI   Assessment and Plan: * Acute on chronic blood loss anemia Chronic Iron Deficiency Anemia  Last  EGD/colonoscopy in 2017 without any etiology found. Declined capsule endoscopy offered by GI in 08/2022, declined upper and lower endoscopy 2023 Receives IV venofer 200 mg(premedicated with Solu-Medrol 40) and as needed blood transfusions by oncology S/p 1 unit PRBC in the ED Serial H&H and transfuse as needed Can consider GI consult, however patient has declined procedures in the recent past  DOE (dyspnea on exertion) Symptomatic anemia COPD with chronic bronchitis Chronic respiratory failure on home O2 at 3 L OSA, intolerant of CPAP Stage l NSCLC s/p radiation, (2021) Chronic diastolic CHF (EF 60 to 65% 06/2022) Dyspnea on exertion at this time likely attributable to symptomatic anemia though patient has multiple comorbid conditions that could be contributing as bolded above Continue supplemental oxygen and titrate as needed CPAP nightly with patient agreeable (required BiPAP during her hospitalization in April 2024) Treat anemia and assess for improvement Patient is clinically euvolemic but will get daily weights and monitor for fluid overload in view of transfusion   Chronic diastolic (congestive) heart failure (HCC) Euvolemic Continue torsemide, Imdur and  bisoprolol  Essential hypertension Continue bisoprolol, torsemide, Imdur  Paroxysmal atrial fibrillation (HCC) Sinus bradycardia Had new onset A-fib/2024 that converted with IV amiodarone  Not on oral anticoagulation in the setting of anemia with high fall risk. Continue bisoprolol and amiodarone pending med verification  PAD (peripheral artery disease) (HCC) Continue rosuvastatin  Type 2 diabetes mellitus with diabetic neuropathy, without long-term current use of insulin (HCC) Sliding scale insulin coverage  Stage 3b chronic kidney disease (HCC) At baseline  Depression, recurrent (HCC) Continue fluoxetine and buspirone  Obesity, Class III, BMI 40-49.9 (morbid obesity) (HCC) Complicating factor to overall prognosis  and care  Hypothyroidism Continue levothyroxine   DVT prophylaxis: SCD  Consults: none  Advance Care Planning:   Code Status: Prior   Family Communication: none  Disposition Plan: Back to previous home environment  Severity of Illness: The appropriate patient status for this patient is OBSERVATION. Observation status is judged to be reasonable and necessary in order to provide the required intensity of service to ensure the patient's safety. The patient's presenting symptoms, physical exam findings, and initial radiographic and laboratory data in the context of their medical condition is felt to place them at decreased risk for further clinical deterioration. Furthermore, it is anticipated that the patient will be medically stable for discharge from the hospital within 2 midnights of admission.   Author: Andris Baumann, MD 11/16/2022 9:20 PM  For on call review [email or web address].

## 2022-11-16 NOTE — ED Provider Notes (Signed)
[facility] Provider Note    Event Date/Time   First MD Initiated Contact with Patient 11/16/22 1703     (approximate)   History   Anemia   HPI Alexandria [name] is a 87 y.o. female with HTN, COPD on 3L nasal cannula chronically, CAD, malignant neoplasm of right upper lung, chronic anemia secondary to ongoing GI bleeding presenting today for anemia.  Patient was seen by her palliative care team today and was noted to have low hemoglobin compared to recent values.  Over the past 4 days she has noted shortness of breath and worsening fatigue.  They advised her to come to the emergency department to get a blood transfusion as they cannot get her in today.  Ongoing suspicion has been concern for GI losses but patient unable to undergo further workup due to comorbidities and has been treated continuously by the cancer center with IV iron and periodic transfusions.  Does note some right sided abdominal pain with prior history of the same but unsure if it is worse today than normal.     Physical Exam   Triage Vital Signs: ED Triage Vitals  Encounter Vitals Group     BP 11/16/22 1516 (!) 114/38     Systolic BP Percentile --      Diastolic BP Percentile --      Pulse Rate 11/16/22 1516 (!) 58     Resp 11/16/22 1516 17     Temp 11/16/22 1516 97.8 F (36.6 C)     Temp Source 11/16/22 1516 Oral     SpO2 --      Weight 11/16/22 1519 233 lb (105.7 kg)     Height --      Head Circumference --      Peak Flow --      Pain Score 11/16/22 1519 0     Pain Loc --      Pain Education --      Exclude from Growth Chart --     Most recent vital signs: Vitals:   11/16/22 2100 11/16/22 2115  BP: (!) 143/92   Pulse: (!) 52 (!) 53  Resp: 19 17  Temp:    SpO2: 100% 100%   Physical Exam: I have reviewed the vital signs and nursing notes. General: Awake, alert, no acute distress.  Nontoxic appearing. Head:  Atraumatic, normocephalic.   ENT:  EOM intact, PERRL. Oral  mucosa is pink and moist with no lesions. Neck: Neck is supple with full range of motion, No meningeal signs. Cardiovascular:  RRR, No murmurs. Peripheral pulses palpable and equal bilaterally. Respiratory:  Symmetrical chest wall expansion.  Mild end expiratory wheezing noted throughout. good air movement throughout.  No use of accessory muscles.   Musculoskeletal:  No cyanosis or edema. Moving extremities with full ROM Abdomen:  Soft, distended abdomen with mild tenderness to palpation along the right side. Neuro:  GCS 15, moving all four extremities, interacting appropriately. Speech clear. Psych:  Calm, appropriate.   Skin:  Warm, dry, no rash.     ED Results / Procedures / Treatments   Labs (all labs ordered are listed, but only abnormal results are displayed) Labs Reviewed  BASIC METABOLIC PANEL - Abnormal; Notable for the following components:      Result Value   Chloride 97 (*)    CO2 37 (*)    Glucose, Bld 200 (*)    BUN 53 (*)    Creatinine, Ser 1.43 (*)    Calcium  8.4 (*)    GFR, Estimated 36 (*)    All other components within normal limits  CBC - Abnormal; Notable for the following components:   RBC 2.31 (*)    Hemoglobin 7.4 (*)    HCT 24.7 (*)    MCV 106.9 (*)    RDW 16.6 (*)    nRBC 0.6 (*)    All other components within normal limits  BRAIN NATRIURETIC PEPTIDE  TYPE AND SCREEN  PREPARE RBC (CROSSMATCH)  TROPONIN I (HIGH SENSITIVITY)  TROPONIN I (HIGH SENSITIVITY)     EKG My EKG interpretation: Rate of 56, sinus bradycardia, normal axis, normal intervals.  No acute ST elevations or depressions   RADIOLOGY CT abdomen/pelvis with no acute pathology per my interpretation   PROCEDURES:  Critical Care performed: Yes, see critical care procedure note(s)  .Critical Care  Performed by: Janith Lima, MD Authorized by: Janith Lima, MD   Critical care provider statement:    Critical care time (minutes):  35   Critical care was necessary to treat  or prevent imminent or life-threatening deterioration of the following conditions:  Circulatory failure (GI bleed requiring blood transfusion)   Critical care was time spent personally by me on the following activities:  Development of treatment plan with patient or surrogate, discussions with consultants, evaluation of patient's response to treatment, examination of patient, ordering and review of laboratory studies, ordering and review of radiographic studies, ordering and performing treatments and interventions, pulse oximetry, re-evaluation of patient's condition and review of old charts    MEDICATIONS ORDERED IN ED: Medications  ipratropium-albuterol (DUONEB) 0.5-2.5 (3) MG/3ML nebulizer solution 3 mL (has no administration in time range)  0.9 %  sodium chloride infusion (10 mL/hr Intravenous New Bag/Given 11/16/22 2024)  iohexol (OMNIPAQUE) 300 MG/ML solution 80 mL (80 mLs Intravenous Contrast Given 11/16/22 1750)     IMPRESSION / MDM / ASSESSMENT AND PLAN / ED COURSE  I reviewed the triage vital signs and the nursing notes.                              Differential diagnosis includes, but is not limited to, chronic GI bleed with worsening anemia, hemorrhoids, COPD exacerbation, CHF exacerbation, volume overload.  Patient's presentation is most consistent with acute presentation with potential threat to life or bodily function.  Patient is an 87 year old female presenting today for anemia in the setting of likely chronic GI bleed.  Hemoglobin drop of 3 points over the past 3 weeks with black stools.  Known history of GI bleed but unable to undergo procedures given comorbidities and has been treated outpatient with frequent transfusions.  Patient given 1 unit PRBC for anemia at 7.4 with symptomatic shortness of breath.  Laboratory workup otherwise reassuring at this time.  Chest x-ray does not show severe volume overload.  CT abdomen/pelvis shows no other acute pathology.  Reassessed patient  following 1 unit PRBC, still endorsing shortness of breath but no hypoxia.  Given precipitous drop of anemia over the past 3 weeks, do think patient would benefit from observation over the next 24 hours in the hospital.  Hospitalist is agreed to admit patient for further care.  The patient is on the cardiac monitor to evaluate for evidence of arrhythmia and/or significant heart rate changes. Clinical Course as of 11/16/22 2121  Tue Nov 16, 2022  1939 CT ABDOMEN PELVIS W CONTRAST No acute pathology noted.  1 spot on the  kidney noted recommending outpatient MRI [DW]  2101 Hospitalist has agreed to admit patient for further care. [DW]    Clinical Course User Index [DW] Janith Lima, MD     FINAL CLINICAL IMPRESSION(S) / ED DIAGNOSES   Final diagnoses:  Acute blood loss anemia  Shortness of breath  Gastrointestinal hemorrhage, unspecified gastrointestinal hemorrhage type     Rx / DC Orders   ED Discharge Orders     None        Note:  This document was prepared using Dragon voice recognition software and may include unintentional dictation errors.   Janith Lima, MD 11/16/22 2121

## 2022-11-16 NOTE — Assessment & Plan Note (Signed)
Continue levothyroxine 

## 2022-11-16 NOTE — Assessment & Plan Note (Signed)
Continue bisoprolol, torsemide, Imdur

## 2022-11-16 NOTE — Progress Notes (Signed)
[facility] [phone] [phone]  Patient Care Team: Joaquim Nam, MD as PCP - General Mariah Milling, Tollie Pizza, MD as PCP - Cardiology (Cardiology) Mariah Milling Tollie Pizza, MD as Consulting Physician (Cardiology) Oneta Rack, NP as Nurse Practitioner (Adult Health Nurse Practitioner) Earna Coder, MD as Consulting Physician (Oncology) Galen Manila, MD as Referring Physician (Ophthalmology)   NAME OF PATIENT: Alexandria [name]  [id number]  October 11, 1935   DATE OF VISIT: 11/16/22  REASON FOR CONSULT: Alexandria [name] is a 87 y.o. female with multiple medical problems including chronic respiratory failure on O2, severe iron deficiency anemia of unclear etiology but likely GI losses who has been unable to undergo workup due to other comorbidities and has been followed at the cancer center for IV iron and periodic transfusions.   INTERVAL HISTORY: Patient had follow-up scheduled today for labs and possible Venofer.  She endorses worse fatigue and shortness of breath over the past week.  She has noticed dark-colored stools intermittently.  Denies any neurologic complaints. Denies recent fevers or illnesses. Reports fair appetite. Denies chest pain. Denies any nausea, vomiting, constipation, or diarrhea. Denies urinary complaints. Patient offers no further specific complaints today.  PAST MEDICAL HISTORY: Past Medical History:  Diagnosis Date   Allergy, unspecified not elsewhere classified    Anxiety    Aortic stenosis, mild 03/30/2022   a. TTE 03/29/2022: EF 55-60, no RWMA, GR 1 DD, normal RVSF, mild aortic stenosis (mean 10, V-max 212 cm/s, DI 0.70)   Cataract    Chronic diastolic congestive heart failure (HCC)    a. 06/2022 Echo: EF 60-65%, no rwma, nl RV fxn, mild MR, mild AS.   COPD (chronic obstructive pulmonary disease) (HCC) 03/08/1998   PFTs 12/12/2002 FEV 1 1.42 (64%) ratio 58 with no better  after B2 and DLCO75%; PFTs 11/20/09 FEV1 1.50 (73%) ratio 50 no better after B2 with DLCO 62%; Hfa 50% 11/20/2009 >75%, 01/13/10 p coaching   Coronary artery calcification seen on CT scan    a. 2012 Neg stress test.   Depression 12/07/1998   Diabetes mellitus type II 02/05/2006   Dr. Elvera Lennox with endo   Disorders of bursae and tendons in shoulder region, unspecified    Rotator cuff syndrome, right   E. coli bacteremia    Esophagitis    GERD (gastroesophageal reflux disease)    History of UTI    Hyperlipidemia 10/04/2000   Hypertension 03/08/1992   Hypothyroidism 03/08/1968   Iron deficiency anemia    Lung nodule    radiation starts 10-08-2019   Malignant neoplasm of right upper lobe of lung (HCC) 07/24/2019   Obesity    NOS   OSA (obstructive sleep apnea)    PSG 01/27/10 AHI 13, pt does not know CPAP settings   PAF (paroxysmal atrial fibrillation) (HCC)    a. 06/2022 in setting of UTI/encephalopathy-->converted on amio; b. CHA2DS2VASc = 6-->No OAC 2/2 chronic anemia/fall risk; c. 06/2022 Zio: Predominantly sinus bradycardia at 54 (3-158)'s.  1 brief run of SVT.  2.2% PVC burden.   Peripheral neuropathy    Likely due to DM per Dr. Tresa Endo hernia     PAST SURGICAL HISTORY:  Past Surgical History:  Procedure Laterality Date   ABD U/S  03/19/1999   Nml x2 foci in liver   ADENOSINE MYOVIEW  06/02/2007   Nml   CARDIOLITE PERSANTINE  08/24/2000   Nml   CAROTID U/S  08/24/2000   1-39%  ICA stenosis   CAROTID U/S  06/02/2007   No apprec change    CARPAL TUNNEL RELEASE  12/1997   Right   CESAREAN SECTION     x2 Breech/ repeat   CHOLECYSTECTOMY  1997   COLONOSCOPY WITH PROPOFOL N/A 02/12/2016   Procedure: COLONOSCOPY WITH PROPOFOL;  Surgeon: Rachael Fee, MD;  Location: [facility];  Service: Endoscopy;  Laterality: N/A;   CT ABD W & PELVIS WO/W CM     Abd hemangiomas of liver, 1 cm R renal cyst   DENTAL SURGERY  2016   Implants   DEXA  07/03/2003   Nml    ESOPHAGOGASTRODUODENOSCOPY  12/05/1997   Nml (due to hoarseness)   ESOPHAGOGASTRODUODENOSCOPY (EGD) WITH PROPOFOL N/A 02/12/2016   Procedure: ESOPHAGOGASTRODUODENOSCOPY (EGD) WITH PROPOFOL;  Surgeon: Rachael Fee, MD;  Location: [facility];  Service: Endoscopy;  Laterality: N/A;   GALLBLADDER SURGERY     HERNIA REPAIR  01/24/2009   Lap Ventr w/ Lysis of adhesions (Dr. Dwain Sarna)   knee arthroscopic surgery  years ago   right   ROTATOR CUFF REPAIR  1984   Right, [place]   SHOULDER OPEN ROTATOR CUFF REPAIR  02/08/2012   Procedure: ROTATOR CUFF REPAIR SHOULDER OPEN;  Surgeon: Drucilla Schmidt, MD;  Location: [facility];  Service: Orthopedics;  Laterality: Left;  Left Shoulder Open Anterior Acrominectomy Rotator Cuff Repair Open Distal Clavicle Resection ,tissue mend graft, and repair of biceps tendon   THUMB RELEASE  12/1997   Right   TONSILLECTOMY     TOTAL ABDOMINAL HYSTERECTOMY  1985   Due to dysmennorhea   US ECHOCARDIOGRAPHY  06/02/2007    HEMATOLOGY/ONCOLOGY HISTORY:  Oncology History   No history exists.    ALLERGIES:  is allergic to antihistamines, diphenhydramine-type; incruse ellipta [umeclidinium bromide]; clarithromycin; codeine; diphenhydramine; pregabalin; sulfonamide derivatives; tiotropium bromide monohydrate; umeclidinium; and vraylar [cariprazine].  MEDICATIONS:  Current Outpatient Medications  Medication Sig Dispense Refill   acetaminophen (TYLENOL) 325 MG tablet Take 2 tablets (650 mg total) by mouth every 6 (six) hours as needed for mild pain, headache, fever or moderate pain.     albuterol (PROAIR HFA) 108 (90 Base) MCG/ACT inhaler Inhale 2 puffs into the lungs every 6 (six) hours as needed for wheezing or shortness of breath. INHALE 2 PUFFS INTO THE LUNGS EVERY 6 HOURS AS NEEDED FOR WHEEZING OR SHORTNESS OF BREATH 3 Inhaler 3   amiodarone (PACERONE) 200 MG tablet TAKE 1 TABLET BY MOUTH EVERY DAY 90 tablet 0   arformoterol (BROVANA) 15 MCG/2ML NEBU Take 2 mLs  (15 mcg total) by nebulization at bedtime. 120 mL 6   bisoprolol (ZEBETA) 5 MG tablet Take 0.5 tablets (2.5 mg total) by mouth daily. 45 tablet 1   budesonide (PULMICORT) 0.5 MG/2ML nebulizer solution Take 2 mLs (0.5 mg total) by nebulization in the morning and at bedtime. 360 mL 3   busPIRone (BUSPAR) 15 MG tablet Take 15 mg by mouth 3 (three) times daily.     Cholecalciferol (VITAMIN D) 50 MCG (2000 UT) tablet Take 1 tablet (2,000 Units total) by mouth daily.     Coenzyme Q-10 200 MG CAPS Take 200 mg by mouth daily.     Cyanocobalamin (B-12) 5000 MCG CAPS Take 5,000 mcg by mouth daily.     esomeprazole (NEXIUM) 40 MG capsule TAKE 1 CAPSULE (40 MG TOTAL) BY MOUTH DAILY AT 12 NOON. 90 capsule 3   ezetimibe (ZETIA) 10 MG tablet TAKE 1 TABLET BY MOUTH EVERYDAY AT BEDTIME 90  tablet 2   ferrous sulfate 325 (65 FE) MG tablet Take 1 tablet (325 mg total) by mouth daily. 90 tablet 3   FLUoxetine (PROZAC) 20 MG tablet Take 20 mg by mouth 2 (two) times daily.     glucose blood (ONETOUCH VERIO) test strip USE AS INSTRUCTED TO CHECK SUGAR 1 TIME DAILY 100 strip 3   guaiFENesin (MUCINEX) 600 MG 12 hr tablet Take 2 tablets (1,200 mg total) by mouth 2 (two) times daily as needed for cough or to loosen phlegm.     isosorbide mononitrate (IMDUR) 30 MG 24 hr tablet Take 30 mg by mouth daily.     levothyroxine (SYNTHROID) 125 MCG tablet Take 1 tablet (125 mcg total) by mouth daily. 90 tablet 3   magnesium 30 MG tablet Take 1 tablet (30 mg total) by mouth 2 (two) times daily. 180 tablet 3   metFORMIN (GLUCOPHAGE) 500 MG tablet Take 1 tablet (500 mg total) by mouth every evening. 90 tablet 3   nitroGLYCERIN (NITROSTAT) 0.4 MG SL tablet Place 1 tablet (0.4 mg total) under the tongue every 5 (five) minutes as needed for chest pain. 25 tablet 3   OneTouch Delica Lancets 33G MISC Use 1x a day 100 each 3   OXYGEN Inhale into the lungs. 2lpm     potassium chloride (KLOR-CON) 10 MEQ tablet TAKE 1 TABLET BY MOUTH EVERY DAY  90 tablet 3   rosuvastatin (CRESTOR) 10 MG tablet TAKE 1 TABLET BY MOUTH EVERY OTHER DAY 45 tablet 2   Spacer/Aero Chamber Mouthpiece MISC Use with  inhaler as needed.  J44.9 1 each 0   torsemide (DEMADEX) 20 MG tablet Take 2 tablets (40 mg total) by mouth daily. Take an additional 20 mg daily as needed for weight greater then 230 pounds 270 tablet 1   melatonin 5 MG TABS Take 1 tablet (5 mg total) by mouth at bedtime. (Patient not taking: Reported on 10/21/2022)  0   nystatin (MYCOSTATIN) 100000 UNIT/ML suspension Take 5 mLs (500,000 Units total) by mouth 4 (four) times daily. (Patient not taking: Reported on 10/21/2022) 120 mL 0   Zinc Oxide (TRIPLE PASTE) 12.8 % ointment Apply 1 Application topically as needed for irritation. Every shift. (Patient not taking: Reported on 10/21/2022)     No current facility-administered medications for this visit.   Facility-Administered Medications Ordered in Other Visits  Medication Dose Route Frequency Provider Last Rate Last Admin   0.9 %  sodium chloride infusion   Intravenous Once Louretta Shorten R, MD       0.9 %  sodium chloride infusion   Intravenous Continuous Earna Coder, MD 20 mL/hr at 11/02/22 1421 New Bag at 11/02/22 1421    VITAL SIGNS: BP (!) 86/46 Comment: manual blood pressure  Pulse (!) 57   Temp 98.6 F (37 C)   Resp 17   Wt 233 lb (105.7 kg) Comment: verbalizied by patient  LMP  (LMP Unknown)   SpO2 98%   BMI 38.77 kg/m  Filed Weights   11/16/22 1417  Weight: 233 lb (105.7 kg)    Estimated body mass index is 38.77 kg/m as calculated from the following:   Height as of 10/21/22: 5\' 5"  (1.651 m).   Weight as of this encounter: 233 lb (105.7 kg).  LABS: CBC:    Component Value Date/Time   WBC 6.3 11/16/2022 1401   HGB 7.2 (L) 11/16/2022 1401   HGB 10.1 (L) 10/26/2022 1425   HGB 10.7 (L) 05/23/2019 1141  HCT 24.7 (L) 11/16/2022 1401   HCT 32.9 (L) 05/23/2019 1141   PLT 197 11/16/2022 1401   PLT 171  10/26/2022 1425   PLT 180 05/23/2019 1141   MCV 106.9 (H) 11/16/2022 1401   MCV 95 05/23/2019 1141   NEUTROABS 4.8 11/16/2022 1401   NEUTROABS 4,161.00 07/22/2022 0000   LYMPHSABS 0.9 11/16/2022 1401   MONOABS 0.4 11/16/2022 1401   EOSABS 0.1 11/16/2022 1401   BASOSABS 0.0 11/16/2022 1401   Comprehensive Metabolic Panel:    Component Value Date/Time   NA 142 09/15/2022 1418   NA 143 07/08/2022 0000   K 4.0 09/15/2022 1418   CL 98 09/15/2022 1418   CO2 37 (H) 09/15/2022 1418   BUN 35 (H) 09/15/2022 1418   BUN 40 (A) 07/08/2022 0000   CREATININE 1.50 (H) 09/17/2022 1548   CREATININE 1.33 (H) 08/19/2022 1300   CREATININE 0.86 01/25/2020 1450   GLUCOSE 121 (H) 09/15/2022 1418   CALCIUM 8.7 (L) 09/15/2022 1418   AST 15 08/09/2022 1305   ALT 15 08/09/2022 1305   ALKPHOS 73 08/09/2022 1305   BILITOT 0.3 08/09/2022 1305   PROT 6.4 (L) 08/09/2022 1305   ALBUMIN 3.2 (L) 08/09/2022 1305    RADIOGRAPHIC STUDIES: No results found.  PERFORMANCE STATUS (ECOG) : 3 - Symptomatic, >50% confined to bed  Review of Systems Unless otherwise noted, a complete review of systems is negative.  Physical Exam General: Chronically frail appearing Cardiovascular: regular rate and rhythm Pulmonary: Poor air movement anteriorly, on O2 Abdomen: soft, nontender, + bowel sounds GU: no suprapubic tenderness Extremities: no edema, no joint deformities Skin: no rashes Neurological: Weakness but otherwise nonfocal  IMPRESSION/PLAN: Symptomatic anemia -patient has acute drop in hemoglobin from 8.7 to 7.2 over the past week.  Down from 10.1 three weeks ago.  Patient with progressive fatigue and shortness of breath in addition to dark-colored stools.  Likely GI bleed.  Patient hypotensive.  Discussed with Dr. Donneta Romberg and we cannot accommodate transfusion in clinic today.  Patient recommended to transfer to the emergency department for further workup and management.  Discussed with patient and daughter  who were in agreement with this plan.  Report called to ED triage.   Patient expressed understanding and was in agreement with this plan. She also understands that She can call clinic at any time with any questions, concerns, or complaints.   Thank you for allowing me to participate in the care of this very pleasant patient.   Time Total: 25 minutes  Visit consisted of counseling and education dealing with the complex and emotionally intense issues of symptom management in the setting of serious illness.Greater than 50%  of this time was spent counseling and coordinating care related to the above assessment and plan.  Signed by: Laurette Schimke, PhD, NP-C

## 2022-11-16 NOTE — Assessment & Plan Note (Signed)
Continue rosuvastatin.  

## 2022-11-16 NOTE — Assessment & Plan Note (Addendum)
Chronic Iron Deficiency Anemia  Last EGD/colonoscopy in 2017 without any etiology found. Declined capsule endoscopy offered by GI in 08/2022, declined upper and lower endoscopy 2023 Receives IV venofer 200 mg(premedicated with Solu-Medrol 40) and as needed blood transfusions by oncology S/p 1 unit PRBC in the ED Serial H&H and transfuse as needed Can consider GI consult, however patient has declined procedures in the recent past

## 2022-11-16 NOTE — Assessment & Plan Note (Signed)
Euvolemic Continue torsemide, Imdur and bisoprolol

## 2022-11-16 NOTE — ED Triage Notes (Signed)
Pt sts that she is coming from the Cancer center and was told that she needs blood. Pt sts that she also has been having low BP. Pt sts that she also has been recently more SOB than normal. Pt wears 3l/min via a [place] [facility].

## 2022-11-16 NOTE — Assessment & Plan Note (Addendum)
Symptomatic anemia COPD with chronic bronchitis Chronic respiratory failure on home O2 at 3 L OSA, intolerant of CPAP Stage l NSCLC s/p radiation, (2021) Chronic diastolic CHF (EF 60 to 65% 06/2022) Dyspnea on exertion at this time likely attributable to symptomatic anemia though patient has multiple comorbid conditions that could be contributing as bolded above Continue supplemental oxygen and titrate as needed CPAP nightly with patient agreeable (required BiPAP during her hospitalization in April 2024) Treat anemia and assess for improvement Patient is clinically euvolemic but will get daily weights and monitor for fluid overload in view of transfusion

## 2022-11-16 NOTE — Assessment & Plan Note (Signed)
Sliding scale insulin coverage 

## 2022-11-16 NOTE — Assessment & Plan Note (Signed)
At baseline 

## 2022-11-16 NOTE — Assessment & Plan Note (Signed)
Continue fluoxetine and buspirone

## 2022-11-16 NOTE — Assessment & Plan Note (Addendum)
Sinus bradycardia Had new onset A-fib/2024 that converted with IV amiodarone  Not on oral anticoagulation in the setting of anemia with high fall risk. Continue bisoprolol and amiodarone pending med verification

## 2022-11-17 ENCOUNTER — Encounter: Payer: Self-pay | Admitting: Internal Medicine

## 2022-11-17 ENCOUNTER — Other Ambulatory Visit: Payer: Self-pay | Admitting: Cardiovascular Disease

## 2022-11-17 DIAGNOSIS — Z6841 Body Mass Index (BMI) 40.0 and over, adult: Secondary | ICD-10-CM | POA: Diagnosis not present

## 2022-11-17 DIAGNOSIS — R0602 Shortness of breath: Secondary | ICD-10-CM | POA: Diagnosis present

## 2022-11-17 DIAGNOSIS — I10 Essential (primary) hypertension: Secondary | ICD-10-CM

## 2022-11-17 DIAGNOSIS — D62 Acute posthemorrhagic anemia: Secondary | ICD-10-CM | POA: Diagnosis present

## 2022-11-17 DIAGNOSIS — R2681 Unsteadiness on feet: Secondary | ICD-10-CM | POA: Diagnosis not present

## 2022-11-17 DIAGNOSIS — J9611 Chronic respiratory failure with hypoxia: Secondary | ICD-10-CM

## 2022-11-17 DIAGNOSIS — I739 Peripheral vascular disease, unspecified: Secondary | ICD-10-CM

## 2022-11-17 DIAGNOSIS — I959 Hypotension, unspecified: Secondary | ICD-10-CM | POA: Diagnosis not present

## 2022-11-17 DIAGNOSIS — D649 Anemia, unspecified: Secondary | ICD-10-CM | POA: Diagnosis present

## 2022-11-17 DIAGNOSIS — E1140 Type 2 diabetes mellitus with diabetic neuropathy, unspecified: Secondary | ICD-10-CM

## 2022-11-17 DIAGNOSIS — I480 Paroxysmal atrial fibrillation: Secondary | ICD-10-CM

## 2022-11-17 DIAGNOSIS — Z1152 Encounter for screening for COVID-19: Secondary | ICD-10-CM | POA: Diagnosis not present

## 2022-11-17 DIAGNOSIS — E1165 Type 2 diabetes mellitus with hyperglycemia: Secondary | ICD-10-CM | POA: Diagnosis not present

## 2022-11-17 DIAGNOSIS — I2510 Atherosclerotic heart disease of native coronary artery without angina pectoris: Secondary | ICD-10-CM | POA: Diagnosis not present

## 2022-11-17 DIAGNOSIS — N1832 Chronic kidney disease, stage 3b: Secondary | ICD-10-CM

## 2022-11-17 DIAGNOSIS — D509 Iron deficiency anemia, unspecified: Secondary | ICD-10-CM | POA: Diagnosis present

## 2022-11-17 DIAGNOSIS — I130 Hypertensive heart and chronic kidney disease with heart failure and stage 1 through stage 4 chronic kidney disease, or unspecified chronic kidney disease: Secondary | ICD-10-CM | POA: Diagnosis present

## 2022-11-17 DIAGNOSIS — I700 Atherosclerosis of aorta: Secondary | ICD-10-CM | POA: Diagnosis not present

## 2022-11-17 DIAGNOSIS — F339 Major depressive disorder, recurrent, unspecified: Secondary | ICD-10-CM

## 2022-11-17 DIAGNOSIS — G4733 Obstructive sleep apnea (adult) (pediatric): Secondary | ICD-10-CM | POA: Diagnosis not present

## 2022-11-17 DIAGNOSIS — J4489 Other specified chronic obstructive pulmonary disease: Secondary | ICD-10-CM | POA: Diagnosis not present

## 2022-11-17 DIAGNOSIS — E039 Hypothyroidism, unspecified: Secondary | ICD-10-CM | POA: Diagnosis present

## 2022-11-17 DIAGNOSIS — E870 Hyperosmolality and hypernatremia: Secondary | ICD-10-CM | POA: Diagnosis not present

## 2022-11-17 DIAGNOSIS — J449 Chronic obstructive pulmonary disease, unspecified: Secondary | ICD-10-CM | POA: Diagnosis not present

## 2022-11-17 DIAGNOSIS — E1151 Type 2 diabetes mellitus with diabetic peripheral angiopathy without gangrene: Secondary | ICD-10-CM | POA: Diagnosis present

## 2022-11-17 DIAGNOSIS — E1142 Type 2 diabetes mellitus with diabetic polyneuropathy: Secondary | ICD-10-CM | POA: Diagnosis present

## 2022-11-17 DIAGNOSIS — E1122 Type 2 diabetes mellitus with diabetic chronic kidney disease: Secondary | ICD-10-CM | POA: Diagnosis present

## 2022-11-17 DIAGNOSIS — I5033 Acute on chronic diastolic (congestive) heart failure: Secondary | ICD-10-CM

## 2022-11-17 DIAGNOSIS — J439 Emphysema, unspecified: Secondary | ICD-10-CM

## 2022-11-17 DIAGNOSIS — Z743 Need for continuous supervision: Secondary | ICD-10-CM | POA: Diagnosis not present

## 2022-11-17 DIAGNOSIS — J441 Chronic obstructive pulmonary disease with (acute) exacerbation: Secondary | ICD-10-CM

## 2022-11-17 DIAGNOSIS — G9349 Other encephalopathy: Secondary | ICD-10-CM | POA: Diagnosis not present

## 2022-11-17 DIAGNOSIS — J9622 Acute and chronic respiratory failure with hypercapnia: Secondary | ICD-10-CM | POA: Diagnosis not present

## 2022-11-17 DIAGNOSIS — I517 Cardiomegaly: Secondary | ICD-10-CM | POA: Diagnosis not present

## 2022-11-17 DIAGNOSIS — U071 COVID-19: Secondary | ICD-10-CM | POA: Diagnosis not present

## 2022-11-17 DIAGNOSIS — I5032 Chronic diastolic (congestive) heart failure: Secondary | ICD-10-CM

## 2022-11-17 DIAGNOSIS — E873 Alkalosis: Secondary | ICD-10-CM | POA: Diagnosis not present

## 2022-11-17 DIAGNOSIS — J9621 Acute and chronic respiratory failure with hypoxia: Secondary | ICD-10-CM | POA: Diagnosis not present

## 2022-11-17 DIAGNOSIS — K5731 Diverticulosis of large intestine without perforation or abscess with bleeding: Secondary | ICD-10-CM | POA: Diagnosis present

## 2022-11-17 DIAGNOSIS — M6281 Muscle weakness (generalized): Secondary | ICD-10-CM | POA: Diagnosis not present

## 2022-11-17 DIAGNOSIS — I350 Nonrheumatic aortic (valve) stenosis: Secondary | ICD-10-CM | POA: Diagnosis present

## 2022-11-17 DIAGNOSIS — M6259 Muscle wasting and atrophy, not elsewhere classified, multiple sites: Secondary | ICD-10-CM | POA: Diagnosis not present

## 2022-11-17 DIAGNOSIS — N179 Acute kidney failure, unspecified: Secondary | ICD-10-CM | POA: Diagnosis not present

## 2022-11-17 LAB — CBG MONITORING, ED
Glucose-Capillary: 129 mg/dL — ABNORMAL HIGH (ref 70–99)
Glucose-Capillary: 107 mg/dL — ABNORMAL HIGH (ref 70–99)

## 2022-11-17 LAB — HEMOGLOBIN
Hemoglobin: 8.1 g/dL — ABNORMAL LOW (ref 12.0–15.0)
Hemoglobin: 8.3 g/dL — ABNORMAL LOW (ref 12.0–15.0)

## 2022-11-17 LAB — GLUCOSE, CAPILLARY
Glucose-Capillary: 353 mg/dL — ABNORMAL HIGH (ref 70–99)
Glucose-Capillary: 283 mg/dL — ABNORMAL HIGH (ref 70–99)

## 2022-11-17 MED ORDER — SODIUM CHLORIDE 0.9 % IV SOLN
200.0000 mg | Freq: Once | INTRAVENOUS | Status: AC
  Administered 2022-11-17: 200 mg via INTRAVENOUS
  Filled 2022-11-17: qty 200

## 2022-11-17 MED ORDER — FUROSEMIDE 10 MG/ML IJ SOLN
20.0000 mg | Freq: Two times a day (BID) | INTRAMUSCULAR | Status: DC
  Administered 2022-11-17 – 2022-11-19 (×5): 20 mg via INTRAVENOUS
  Filled 2022-11-17 (×5): qty 4

## 2022-11-17 MED ORDER — EZETIMIBE 10 MG PO TABS
10.0000 mg | ORAL_TABLET | Freq: Every day | ORAL | Status: DC
  Administered 2022-11-17 – 2022-11-26 (×10): 10 mg via ORAL
  Filled 2022-11-17 (×10): qty 1

## 2022-11-17 MED ORDER — METHYLPREDNISOLONE SODIUM SUCC 125 MG IJ SOLR
60.0000 mg | Freq: Two times a day (BID) | INTRAMUSCULAR | Status: DC
  Administered 2022-11-17 – 2022-11-19 (×5): 60 mg via INTRAVENOUS
  Filled 2022-11-17 (×5): qty 2

## 2022-11-17 MED ORDER — PANTOPRAZOLE SODIUM 40 MG PO TBEC
40.0000 mg | DELAYED_RELEASE_TABLET | Freq: Every day | ORAL | Status: DC
  Administered 2022-11-17 – 2022-11-26 (×10): 40 mg via ORAL
  Filled 2022-11-17 (×10): qty 1

## 2022-11-17 MED ORDER — ARFORMOTEROL TARTRATE 15 MCG/2ML IN NEBU
15.0000 ug | INHALATION_SOLUTION | Freq: Every day | RESPIRATORY_TRACT | Status: DC
  Filled 2022-11-17 (×2): qty 2

## 2022-11-17 MED ORDER — SODIUM CHLORIDE 0.9 % IV SOLN
2.0000 g | INTRAVENOUS | Status: DC
  Administered 2022-11-17 – 2022-11-19 (×3): 2 g via INTRAVENOUS
  Filled 2022-11-17 (×3): qty 20

## 2022-11-17 MED ORDER — FLUTICASONE PROPIONATE 50 MCG/ACT NA SUSP
2.0000 | Freq: Every day | NASAL | Status: DC
  Administered 2022-11-18 – 2022-11-26 (×9): 2 via NASAL
  Filled 2022-11-17 (×2): qty 16

## 2022-11-17 MED ORDER — INSULIN ASPART 100 UNIT/ML IJ SOLN: 0.0000 [IU] | Freq: Three times a day (TID) | INTRAMUSCULAR | Status: DC

## 2022-11-17 MED ORDER — INFLUENZA VAC A&B SURF ANT ADJ 0.5 ML IM SUSY
0.5000 mL | PREFILLED_SYRINGE | INTRAMUSCULAR | Status: DC
  Filled 2022-11-17: qty 0.5

## 2022-11-17 MED ORDER — LORATADINE 10 MG PO TABS
10.0000 mg | ORAL_TABLET | Freq: Every day | ORAL | Status: DC
  Administered 2022-11-17 – 2022-11-26 (×10): 10 mg via ORAL
  Filled 2022-11-17 (×10): qty 1

## 2022-11-17 MED ORDER — LEVALBUTEROL HCL 0.63 MG/3ML IN NEBU
0.6300 mg | INHALATION_SOLUTION | Freq: Three times a day (TID) | RESPIRATORY_TRACT | Status: DC
  Administered 2022-11-17 – 2022-11-19 (×6): 0.63 mg via RESPIRATORY_TRACT
  Filled 2022-11-17 (×7): qty 3

## 2022-11-17 MED ORDER — AMIODARONE HCL 200 MG PO TABS
200.0000 mg | ORAL_TABLET | Freq: Every day | ORAL | Status: DC
  Administered 2022-11-17 – 2022-11-26 (×10): 200 mg via ORAL
  Filled 2022-11-17 (×10): qty 1

## 2022-11-17 MED ORDER — FLUOXETINE HCL 10 MG PO CAPS
20.0000 mg | ORAL_CAPSULE | Freq: Two times a day (BID) | ORAL | Status: DC
  Administered 2022-11-17 – 2022-11-26 (×17): 20 mg via ORAL
  Filled 2022-11-17 (×6): qty 1
  Filled 2022-11-17: qty 2
  Filled 2022-11-17 (×2): qty 1
  Filled 2022-11-17: qty 2
  Filled 2022-11-17: qty 1
  Filled 2022-11-17 (×2): qty 2
  Filled 2022-11-17: qty 1
  Filled 2022-11-17 (×2): qty 2
  Filled 2022-11-17: qty 1
  Filled 2022-11-17: qty 2
  Filled 2022-11-17: qty 1
  Filled 2022-11-17 (×2): qty 2

## 2022-11-17 MED ORDER — BUDESONIDE 0.5 MG/2ML IN SUSP
0.5000 mg | Freq: Two times a day (BID) | RESPIRATORY_TRACT | Status: DC
  Administered 2022-11-17 – 2022-11-26 (×19): 0.5 mg via RESPIRATORY_TRACT
  Filled 2022-11-17 (×19): qty 2

## 2022-11-17 MED ORDER — BUSPIRONE HCL 10 MG PO TABS
5.0000 mg | ORAL_TABLET | Freq: Three times a day (TID) | ORAL | Status: DC
  Administered 2022-11-17 – 2022-11-22 (×14): 5 mg via ORAL
  Filled 2022-11-17 (×14): qty 1

## 2022-11-17 NOTE — ED Notes (Signed)
Lab in pt room at this time for blood collection.

## 2022-11-17 NOTE — ED Notes (Signed)
Dr. Janee Morn made aware of pt's recent VS. Hold bisoprolol and lasix at this time

## 2022-11-17 NOTE — ED Notes (Signed)
Report received from Tiffany, RN.

## 2022-11-17 NOTE — Progress Notes (Signed)
CPAP refused/declined by pt at this time. Caregiver/family at bedside and stated pt doesn't use cpap at home just supplemental oxygen.

## 2022-11-17 NOTE — ED Notes (Signed)
Dr. Janee Morn. MD at bedside at this time.

## 2022-11-17 NOTE — Progress Notes (Addendum)
PROGRESS NOTE    Alexandria [name]  ZOX:[id number] DOB: 1935-06-17 DOA: 11/16/2022 PCP: Joaquim Nam, MD    Chief Complaint  Patient presents with   Anemia    Brief Narrative:  Patient is a 87 year old female history of COPD on home O2 3 L, HFpEF EF 60 to 65% 06/2022, CKD stage IIIb, diabetes, OSA not on CPAP, stage I NSCLC s/p radiation 2021, chronic iron deficiency anemia for several years with upper and lower endoscopy in 2017 showing hemorrhoids and diverticulosis without hemorrhage seen in the cancer center and sent to the ED due to anemia.  Patient presented with complaints of worsening shortness of breath than baseline as well as fatigue.  Hemoglobin noted at PCPs office and 7.4 down from a baseline of about 10 just 3 weeks prior to admission.  Patient did endorse some dark stools but not black in color.  Patient noted per records to have been seen by [place] GI on 08/2022, capsule endoscopy recommended however daughter declined due to risk of procedure if positive.  It is noted in 2023 the patient not considered for further endoscopic evaluation as he was 40 pose more risk than benefit.  Plan from GI was to continue monitoring with iron infusion and transfusion as needed.  CT abdomen and pelvis done on admission with extensive colonic diverticulosis.  Patient transfused a unit of PRBCs in the ED however continued to be dyspneic and patient admitted for further evaluation.   Assessment & Plan:   Principal Problem:   Acute on chronic blood loss anemia Active Problems:   DOE (dyspnea on exertion)   COPD with chronic bronchitis and emphysema (HCC)   Chronic respiratory failure with hypoxia (HCC)   OSA on CPAP   Malignant neoplasm of right upper lobe of lung (HCC)   Essential hypertension   Chronic diastolic (congestive) heart failure (HCC)   CAD (coronary artery disease)   Paroxysmal atrial fibrillation (HCC)   PAD (peripheral artery disease) (HCC)   Type 2 diabetes mellitus with  diabetic neuropathy, without long-term current use of insulin (HCC)   Stage 3b chronic kidney disease (HCC)   Hypothyroidism   Obesity, Class III, BMI 40-49.9 (morbid obesity) (HCC)   Depression, recurrent (HCC)   Symptomatic anemia   Acute on chronic diastolic CHF (congestive heart failure) (HCC)   COPD with acute exacerbation (HCC)  #1 acute on chronic blood loss anemia/chronic iron deficiency anemia -??  Etiology. -Patient denies any significant overt bleeding. -Patient noted to have had an EGD/colonoscopy 2017 without any etiology. -Patient seen by GI 08/2022 capsule endoscopy offered but patient and family declined.  Declined upper and lower endoscopy 2023. -Status post transfusion 1 unit PRBCs hemoglobin currently at 8.1 this morning from 7.2 on admission. -Will order dose of IV Venofer and premedicate with Solu-Medrol. -Follow H&H. -Transfusion threshold hemoglobin < 8.  2.  Dyspnea on exertion/?  Symptomatic anemia/chronic respiratory failure on home O2 3 L/probable acute COPD exacerbation/OSA intolerant of CPAP/stage I NSCLC s/p radiation 2021/??  Acute on chronic diastolic CHF -Patient's dyspnea on exertion likely multifactorial secondary to symptomatic anemia in the setting of probable acute COPD exacerbation and concern for volume overload. -Per daughter patient with a 11 pound weight gain over the past 10 to 11 days. -BP noted to be borderline on admission. -BNP at 155.4. -Status post transfusion 1 unit PRBCs however per patient no significant change in dyspnea on exertion. -Patient with some wheezing noted on examination as such we will treat as his  acute COPD exacerbation. -Placed on Solu-Medrol 60 mg IV every 12 hours, scheduled Xopenex nebs, Pulmicort, Brovana, PPI, Flonase. -Will also placed on Lasix 20 mg IV every 12 hours, strict I's and O's, daily weights.  3.  Hyperglycemia/diabetes mellitus type 2 -Likely secondary to steroids. -Patient noted to have a hemoglobin  A1c 5.3 (1/9/20240 -SSI. -Repeat hemoglobin A1c.  4. ??  Acute on chronic diastolic CHF -Per daughter patient with a 11 pound weight gain over the past 10 to 11 days with baseline weight approximately in the 220s. -Current weight on presentation to 33.5. -BNP of 155.4. -Placed on Lasix 20 mg IV every 12 hours. -Continue home regimen Imdur and bisoprolol. -Strict I's and O's, daily weights.  5.  Hypertension -Continue bisoprolol, Imdur, Lasix.  6.  Paroxysmal A-fib/sinus bradycardia -Patient noted to have new onset A-fib in 2024 that converted with IV amiodarone. -Not a anticoagulation candidate in the setting of anemia and high fall risk. -Resume home regimen amiodarone.  Continue bisoprolol for rate control. -May need to hold bisoprolol if patient remains bradycardic.  7.  PAD -Statin.  8.  CKD stage IIIb -Stable.  9.  Depression -Resume home regimen of Prozac. -Resume BuSpar at a decreased dose of 5 mg 3 times daily and uptitrate back to home regimen of 50 mg 3 times daily as tolerated.  10.  Hypothyroidism Synthroid.  11.  Morbid obesity -Lifestyle modification -Outpatient follow-up with PCP.   DVT prophylaxis: SCDs Code Status: Full Family Communication: Updated patient.  Updated daughter on phone during visit. Disposition: TBD  Status is: Inpatient The patient will require care spanning > 2 midnights and should be moved to inpatient because: Severity of illness   Consultants:  None  Procedures:  CT abdomen and pelvis 11/16/2022 Chest x-ray 11/16/2022 Transfusion 1 unit PRBCs 11/26/2022  Antimicrobials:  Anti-infectives (From admission, onward)    Start     Dose/Rate Route Frequency Ordered Stop   11/17/22 2100  cefTRIAXone (ROCEPHIN) 2 g in sodium chloride 0.9 % 100 mL IVPB        2 g 200 mL/hr over 30 Minutes Intravenous Every 24 hours 11/17/22 1949 11/22/22 2059         Subjective: Patient laying in bed.  Posttransfusion.  Patient with  complaints of no significant change or shortness of breath on minimal exertion from presentation.  Denies any chest pain.  No abdominal pain.  Daughter on phone states patient has had about 11 pound weight gain over the past 10 to 11 days.  Objective: Vitals:   11/17/22 1322 11/17/22 1346 11/17/22 1658 11/17/22 2000  BP: (!) 110/55  (!) 132/48 (!) 101/44  Pulse: 61  67 66  Resp: 15  20 18   Temp: 97.8 F (36.6 C)  98 F (36.7 C) 98.4 F (36.9 C)  TempSrc: Oral  Oral Oral  SpO2: 100%  99% 94%  Weight:  105.7 kg    Height:  5\' 5"  (1.651 m)      Intake/Output Summary (Last 24 hours) at 11/17/2022 2006 Last data filed at 11/17/2022 1936 Gross per 24 hour  Intake 690.5 ml  Output --  Net 690.5 ml   Filed Weights   11/16/22 1519 11/17/22 1346  Weight: 105.7 kg 105.7 kg    Examination:  General exam: Appears calm and comfortable  Respiratory system: Some diffuse expiratory wheezing.  No significant crackles noted.  No rhonchi.  Fair air movement.   Cardiovascular system: S1 & S2 heard, RRR. No JVD, murmurs, rubs,  gallops or clicks. No pedal edema. Gastrointestinal system: Abdomen is nondistended, soft and nontender. No organomegaly or masses felt. Normal bowel sounds heard. Central nervous system: Alert and oriented. No focal neurological deficits. Extremities: Symmetric 5 x 5 power. Skin: No rashes, lesions or ulcers Psychiatry: Judgement and insight appear normal. Mood & affect appropriate.     Data Reviewed: I have personally reviewed following labs and imaging studies  CBC: Recent Labs  Lab 11/16/22 1401 11/16/22 1524 11/17/22 0043 11/17/22 0623  WBC 6.3 6.9  --   --   NEUTROABS 4.8  --   --   --   HGB 7.2* 7.4* 8.3* 8.1*  HCT 24.7* 24.7*  --   --   MCV 106.9* 106.9*  --   --   PLT 197 198  --   --     Basic Metabolic Panel: Recent Labs  Lab 11/16/22 1524  NA 141  K 4.6  CL 97*  CO2 37*  GLUCOSE 200*  BUN 53*  CREATININE 1.43*  CALCIUM 8.4*     GFR: Estimated Creatinine Clearance: 34.7 mL/min (A) (by C-G formula based on SCr of 1.43 mg/dL (H)).  Liver Function Tests: No results for input(s): "AST", "ALT", "ALKPHOS", "BILITOT", "PROT", "ALBUMIN" in the last 168 hours.  CBG: Recent Labs  Lab 11/17/22 0732 11/17/22 1128 11/17/22 1659  GLUCAP 129* 107* 353*     No results found for this or any previous visit (from the past 240 hour(s)).       Radiology Studies: CT ABDOMEN PELVIS W CONTRAST  Result Date: 11/16/2022 CLINICAL DATA:  Right-sided abdominal pain. Known hernia with known cancer. Non-small-cell lung cancer. * Tracking Code: BO * EXAM: CT ABDOMEN AND PELVIS WITH CONTRAST TECHNIQUE: Multidetector CT imaging of the abdomen and pelvis was performed using the standard protocol following bolus administration of intravenous contrast. RADIATION DOSE REDUCTION: This exam was performed according to the departmental dose-optimization program which includes automated exposure control, adjustment of the mA and/or kV according to patient size and/or use of iterative reconstruction technique. CONTRAST:  80mL OMNIPAQUE IOHEXOL 300 MG/ML  SOLN COMPARISON:  Abdomen and pelvis CT 03/17/2021. Chest CT scan 09/17/2022 FINDINGS: Lower chest: There is some basilar atelectasis. No pleural effusion. There is a 3 mm nodule medial left lower lobe on series 7, image 5. Calcifications along the mitral valve annulus. No pericardial effusion. Coronary artery calcifications are seen. Hepatobiliary: Once again there are hepatic hemangiomas in segment 8 and 2 as on previous. Previous cholecystectomy. Patent portal vein. Pancreas: Moderate atrophy of the pancreas. Spleen: Normal in size without focal abnormality. Adrenals/Urinary Tract: The adrenal glands are preserved. No collecting system dilatation. The ureters have normal course and caliber extending down to the bladder. Preserved contours of the urinary bladder. Once again the kidneys has some  Bosniak 1 renal cysts. Largest is seen exophytic from the anterior aspect of the right kidney measuring 3.5 cm. Hounsfield unit of 15. However there is 1 lesion along the upper aspect of the left kidney laterally which is more complex. Precontrast density of 37 38, portal venous phase 52 and delayed with a somewhat thick potentially enhancing wall and Hounsfield units of 65. Possible solid mass or renal cell carcinoma. Recommend further workup when appropriate. No abnormal calcifications are seen within either kidney nor along the course of either ureter on the noncontrast dataset. Stomach/Bowel: Stomach is underdistended on this non oral contrast exam. Question fold thickening. Please correlate with symptoms. The small bowel is nondilated. There is extensive  colonic diverticulosis without dilatation or obstruction. Normal appendix seen retrocecal. Vascular/Lymphatic: Extensive vascular calcifications along the aorta and branch vessels. Normal caliber aorta and IVC. No discrete abnormal lymph node enlargement identified in the abdomen and pelvis. Reproductive: Status post hysterectomy. No adnexal masses. Other: No free air or free fluid. There is mesh along the anterior abdominal wall. Musculoskeletal: Osteopenia. Scattered degenerative changes. Trace anterolisthesis of L5 on S1. Significant degenerative changes as well of the pelvis. IMPRESSION: Extensive colonic diverticulosis. Normal appendix. No bowel obstruction, free air or free fluid. There is collapsed stomach but gastric fold thickening. Please correlate with symptoms. Bilateral renal cystic foci. There is 1 potentially enhancing lesion in the upper pole left kidney measuring 19 mm. Recommend dedicated MRI evaluation when appropriate to further assess and exclude underlying aggressive lesion. Known hepatic hemangiomas. Electronically Signed   By: Karen Kays M.D.   On: 11/16/2022 19:33   DG Chest 2 View  Result Date: 11/16/2022 CLINICAL DATA:   Shortness of breath. EXAM: CHEST - 2 VIEW COMPARISON:  Chest radiographs 07/05/2022, 06/29/2022; CT chest 09/17/2022 and 03/19/2022 FINDINGS: Cardiac silhouette is again mildly to mildly enlarged. Moderate atherosclerotic calcification is again seen within the aortic arch. There is again flattening of the diaphragms and mild hyperinflation. Unchanged opacity overlying the superomedial right lung corresponding to the chronic scarring seen on prior 09/17/2022 and 03/19/2022 CTs. Mild blunting of the posterior costophrenic angle is similar to 03/04/2022 and favored represent mild scarring. No definite pleural effusion. No pneumothorax. Moderate multilevel degenerative disc changes of the thoracic spine. IMPRESSION: Mild chronic hyperinflation.  No acute lung process. Electronically Signed   By: Neita Garnet M.D.   On: 11/16/2022 17:02        Scheduled Meds:  amiodarone  200 mg Oral Daily   arformoterol  15 mcg Nebulization QHS   bisoprolol  2.5 mg Oral Daily   budesonide (PULMICORT) nebulizer solution  0.5 mg Nebulization BID   busPIRone  5 mg Oral TID   cholecalciferol  2,000 Units Oral Daily   cyanocobalamin  5,000 mcg Oral Daily   ezetimibe  10 mg Oral Daily   ferrous sulfate  325 mg Oral Daily   FLUoxetine  20 mg Oral BID   fluticasone  2 spray Each Nare Daily   furosemide  20 mg Intravenous Q12H   [START ON 11/18/2022] influenza vaccine adjuvanted  0.5 mL Intramuscular Tomorrow-1000   insulin aspart  0-20 Units Subcutaneous TID WC   insulin aspart  0-5 Units Subcutaneous QHS   isosorbide mononitrate  30 mg Oral Daily   levalbuterol  0.63 mg Nebulization TID   levothyroxine  125 mcg Oral Q0600   loratadine  10 mg Oral Daily   magnesium oxide  200 mg Oral BID   methylPREDNISolone (SOLU-MEDROL) injection  60 mg Intravenous Q12H   pantoprazole  40 mg Oral Q0600   potassium chloride  10 mEq Oral Daily   [START ON 11/18/2022] rosuvastatin  10 mg Oral QODAY   Continuous Infusions:   cefTRIAXone (ROCEPHIN)  IV       LOS: 0 days    Time spent: 40 minutes    Ramiro Harvest, MD [facility]   To contact the attending provider between 7A-7P or the covering provider during after hours 7P-7A, please log into the web site [email or web address] and access using universal [place] password for that web site. If you do not have the password, please call the hospital operator.  11/17/2022, 8:06 PM

## 2022-11-18 ENCOUNTER — Encounter: Payer: Self-pay | Admitting: Internal Medicine

## 2022-11-18 DIAGNOSIS — J4489 Other specified chronic obstructive pulmonary disease: Secondary | ICD-10-CM | POA: Diagnosis not present

## 2022-11-18 DIAGNOSIS — I480 Paroxysmal atrial fibrillation: Secondary | ICD-10-CM | POA: Diagnosis not present

## 2022-11-18 DIAGNOSIS — I5032 Chronic diastolic (congestive) heart failure: Secondary | ICD-10-CM | POA: Diagnosis not present

## 2022-11-18 DIAGNOSIS — D62 Acute posthemorrhagic anemia: Secondary | ICD-10-CM | POA: Diagnosis not present

## 2022-11-18 LAB — CBC
HCT: 25.4 % — ABNORMAL LOW (ref 36.0–46.0)
HCT: 30.8 % — ABNORMAL LOW (ref 36.0–46.0)
Hemoglobin: 7.9 g/dL — ABNORMAL LOW (ref 12.0–15.0)
Hemoglobin: 9.6 g/dL — ABNORMAL LOW (ref 12.0–15.0)
MCH: 31.3 pg (ref 26.0–34.0)
MCH: 31.6 pg (ref 26.0–34.0)
MCHC: 31.1 g/dL (ref 30.0–36.0)
MCHC: 31.2 g/dL (ref 30.0–36.0)
MCV: 100.8 fL — ABNORMAL HIGH (ref 80.0–100.0)
MCV: 101.3 fL — ABNORMAL HIGH (ref 80.0–100.0)
Platelets: 179 10*3/uL (ref 150–400)
Platelets: 209 10*3/uL (ref 150–400)
RBC: 2.52 MIL/uL — ABNORMAL LOW (ref 3.87–5.11)
RBC: 3.04 MIL/uL — ABNORMAL LOW (ref 3.87–5.11)
RDW: 17.4 % — ABNORMAL HIGH (ref 11.5–15.5)
RDW: 17.5 % — ABNORMAL HIGH (ref 11.5–15.5)
WBC: 7.1 10*3/uL (ref 4.0–10.5)
WBC: 12.3 10*3/uL — ABNORMAL HIGH (ref 4.0–10.5)
nRBC: 0.4 % — ABNORMAL HIGH (ref 0.0–0.2)
nRBC: 0.4 % — ABNORMAL HIGH (ref 0.0–0.2)

## 2022-11-18 LAB — GLUCOSE, CAPILLARY
Glucose-Capillary: 186 mg/dL — ABNORMAL HIGH (ref 70–99)
Glucose-Capillary: 239 mg/dL — ABNORMAL HIGH (ref 70–99)
Glucose-Capillary: 197 mg/dL — ABNORMAL HIGH (ref 70–99)
Glucose-Capillary: 280 mg/dL — ABNORMAL HIGH (ref 70–99)

## 2022-11-18 LAB — MAGNESIUM: Magnesium: 2.7 mg/dL — ABNORMAL HIGH (ref 1.7–2.4)

## 2022-11-18 LAB — BASIC METABOLIC PANEL
Anion gap: 12 (ref 5–15)
BUN: 32 mg/dL — ABNORMAL HIGH (ref 8–23)
CO2: 34 mmol/L — ABNORMAL HIGH (ref 22–32)
Calcium: 8.5 mg/dL — ABNORMAL LOW (ref 8.9–10.3)
Chloride: 95 mmol/L — ABNORMAL LOW (ref 98–111)
Creatinine, Ser: 1.28 mg/dL — ABNORMAL HIGH (ref 0.44–1.00)
GFR, Estimated: 41 mL/min — ABNORMAL LOW (ref >60)
Glucose, Bld: 203 mg/dL — ABNORMAL HIGH (ref 70–99)
Potassium: 4.1 mmol/L (ref 3.5–5.1)
Sodium: 141 mmol/L (ref 135–145)

## 2022-11-18 LAB — PREPARE RBC (CROSSMATCH)

## 2022-11-18 LAB — HEMOGLOBIN A1C
Hgb A1c MFr Bld: 5.2 % (ref 4.8–5.6)
Mean Plasma Glucose: 103 mg/dL

## 2022-11-18 MED ORDER — ACETAMINOPHEN 325 MG PO TABS
650.0000 mg | ORAL_TABLET | Freq: Once | ORAL | Status: AC
  Administered 2022-11-18: 650 mg via ORAL
  Filled 2022-11-18: qty 2

## 2022-11-18 MED ORDER — ISOSORBIDE MONONITRATE ER 30 MG PO TB24
15.0000 mg | ORAL_TABLET | Freq: Every day | ORAL | Status: DC
  Administered 2022-11-19 – 2022-11-26 (×8): 15 mg via ORAL
  Filled 2022-11-18 (×8): qty 1

## 2022-11-18 MED ORDER — FUROSEMIDE 10 MG/ML IJ SOLN
20.0000 mg | Freq: Once | INTRAMUSCULAR | Status: AC
  Administered 2022-11-18: 20 mg via INTRAVENOUS
  Filled 2022-11-18: qty 4

## 2022-11-18 MED ORDER — ARFORMOTEROL TARTRATE 15 MCG/2ML IN NEBU
15.0000 ug | INHALATION_SOLUTION | Freq: Every day | RESPIRATORY_TRACT | Status: DC
  Administered 2022-11-18 – 2022-11-25 (×8): 15 ug via RESPIRATORY_TRACT
  Filled 2022-11-18 (×9): qty 2

## 2022-11-18 MED ORDER — LEVALBUTEROL HCL 0.63 MG/3ML IN NEBU: 0.6300 mg | INHALATION_SOLUTION | RESPIRATORY_TRACT | Status: DC | PRN

## 2022-11-18 MED ORDER — SODIUM CHLORIDE 0.9% IV SOLUTION: Freq: Once | INTRAVENOUS | Status: AC

## 2022-11-18 NOTE — Progress Notes (Signed)
PROGRESS NOTE    Alexandria [name]  ZOX:[id number] DOB: 1935/10/16 DOA: 11/16/2022 PCP: Joaquim Nam, MD    Chief Complaint  Patient presents with   Anemia    Brief Narrative:  Patient is a 87 year old female history of COPD on home O2 3 L, HFpEF EF 60 to 65% 06/2022, CKD stage IIIb, diabetes, OSA not on CPAP, stage I NSCLC s/p radiation 2021, chronic iron deficiency anemia for several years with upper and lower endoscopy in 2017 showing hemorrhoids and diverticulosis without hemorrhage seen in the cancer center and sent to the ED due to anemia.  Patient presented with complaints of worsening shortness of breath than baseline as well as fatigue.  Hemoglobin noted at PCPs office and 7.4 down from a baseline of about 10 just 3 weeks prior to admission.  Patient did endorse some dark stools but not black in color.  Patient noted per records to have been seen by Francisco, GI on 08/2022, capsule endoscopy recommended however daughter declined due to risk of procedure if positive.  It is noted in 2023 the patient not considered for further endoscopic evaluation as he was 40 pose more risk than benefit.  Plan from GI was to continue monitoring with iron infusion and transfusion as needed.  CT abdomen and pelvis done on admission with extensive colonic diverticulosis.  Patient transfused a unit of PRBCs in the ED however continued to be dyspneic and patient admitted for further evaluation.   Assessment & Plan:   Principal Problem:   Acute on chronic blood loss anemia Active Problems:   DOE (dyspnea on exertion)   COPD with chronic bronchitis and emphysema (HCC)   Chronic respiratory failure with hypoxia (HCC)   OSA on CPAP   Malignant neoplasm of right upper lobe of lung (HCC)   Essential hypertension   Chronic diastolic (congestive) heart failure (HCC)   CAD (coronary artery disease)   Paroxysmal atrial fibrillation (HCC)   PAD (peripheral artery disease) (HCC)   Type 2 diabetes mellitus with  diabetic neuropathy, without long-term current use of insulin (HCC)   Stage 3b chronic kidney disease (HCC)   Hypothyroidism   Obesity, Class III, BMI 40-49.9 (morbid obesity) (HCC)   Depression, recurrent (HCC)   Symptomatic anemia   Acute on chronic diastolic CHF (congestive heart failure) (HCC)   COPD with acute exacerbation (HCC)  #1 acute on chronic blood loss anemia/chronic iron deficiency anemia -??  Etiology. -Patient denies any significant overt bleeding. -Patient noted to have had an EGD/colonoscopy 2017 without any etiology. -Patient seen by GI 08/2022 capsule endoscopy offered but patient and family declined.  Declined upper and lower endoscopy 2023. -Status post transfusion 1 unit PRBCs hemoglobin currently at 7.9 from 8.1 from 7.2 on admission. -Status post IV Venofer. -Transfuse 1 unit PRBCs as patient with cardiac history. -Follow H&H. -Transfusion threshold hemoglobin < 8.  2.  Dyspnea on exertion/?  Symptomatic anemia/chronic respiratory failure on home O2 3 L/probable acute COPD exacerbation/OSA intolerant of CPAP/stage I NSCLC s/p radiation 2021/??  Acute on chronic diastolic CHF -Patient's dyspnea on exertion likely multifactorial secondary to symptomatic anemia in the setting of probable acute COPD exacerbation and concern for volume overload. -Per daughter patient with a 11 pound weight gain over the past 10 to 11 days. -BP noted to be borderline on admission. -BNP at 155.4. -Status post transfusion 1 unit PRBCs however per patient no significant change in dyspnea on exertion. -Patient with some wheezing noted on examination as such patient started  treatment for acute COPD exacerbation.   -Hemoglobin at 7.9 this morning and as such we will transfuse another unit of PRBCs to keep hemoglobin > 8. -Continue Solu-Medrol 60 mg IV every 12 hours, scheduled Xopenex nebs, Pulmicort, Brovana, PPI, Flonase. -Continue Lasix 20 mg IV every 12 hours, strict I's and O's, daily  weights.  3.  Hyperglycemia/diabetes mellitus type 2 -Likely secondary to steroids. -Patient noted to have a hemoglobin A1c 5.3 (03/16/2022) -Repeat hemoglobin A1c of 5.2. -Follow CBGs with steroid taper. -SSI.  4. ??  Acute on chronic diastolic CHF -Per daughter patient with a 11 pound weight gain over the past 10 to 11 days with baseline weight approximately in the 220s. -Patient with recent 2D echo 06/30/2022. -Weight on presentation was 233 pounds.  -BNP of 155.4. -Current weight of 235.89 pounds however doubt accuracy as patient noted to have a urine output of 1.3 L since admission. -Continue Lasix 20 mg IV every 12 hours. -Continue bisoprolol however if heart rate remains bradycardic will discontinue bisoprolol. -Decrease Imdur to 15 mg daily to allow room for diuresis. -Strict I's and O's, daily weights.  5.  Hypertension -Continue bisoprolol, Imdur, Lasix.    6.  Paroxysmal A-fib/sinus bradycardia -Patient noted to have new onset A-fib in 2024 that converted with IV amiodarone. -Not a anticoagulation candidate in the setting of anemia and high fall risk. -Continue home regimen amiodarone.  Heart rate ranging from the 50s to the 70s on low-dose bisoprolol.  -Monitor heart rate on bisoprolol and if bradycardic will discontinue bisoprolol for now.  7.  PAD -Statin, Zetia.  8.  CKD stage IIIb -Stable.  9.  Depression -Continue home regimen of Prozac.   -Continue decreased home dose of BuSpar 5 mg 3 times daily.    10.  Hypothyroidism Continue home regimen Synthroid.    11.  Morbid obesity -Lifestyle modification -Outpatient follow-up with PCP.   DVT prophylaxis: SCDs Code Status: Full Family Communication: Updated patient.  Updated daughter, [name] at bedside.   Disposition: TBD  Status is: Inpatient The patient will require care spanning > 2 midnights and should be moved to inpatient because: Severity of illness   Consultants:  None  Procedures:  CT abdomen  and pelvis 11/16/2022 Chest x-ray 11/16/2022 Transfusion 1 unit PRBCs 11/16/2022 Transfusion 1 unit PRBC pending 11/18/2022  Antimicrobials:  Anti-infectives (From admission, onward)    Start     Dose/Rate Route Frequency Ordered Stop   11/17/22 2100  cefTRIAXone (ROCEPHIN) 2 g in sodium chloride 0.9 % 100 mL IVPB        2 g 200 mL/hr over 30 Minutes Intravenous Every 24 hours 11/17/22 1949 11/22/22 2059         Subjective: Patient sitting up in bed.  Denies any chest pain.  Still with shortness of breath however states some improvement since admission.  Denies any abdominal pain.  No bleeding.  Daughter at bedside.    Objective: Vitals:   11/17/22 2103 11/18/22 0405 11/18/22 0746 11/18/22 0750  BP:  (!) 128/35  128/66  Pulse: 78 60  60  Resp: 18 18  18   Temp:  97.8 F (36.6 C)  98.2 F (36.8 C)  TempSrc:  Oral    SpO2: 94% 96% 96% 98%  Weight:  107 kg    Height:        Intake/Output Summary (Last 24 hours) at 11/18/2022 1056 Last data filed at 11/18/2022 0314 Gross per 24 hour  Intake 340.5 ml  Output 1300 ml  Net -959.5 ml   Filed Weights   11/16/22 1519 11/17/22 1346 11/18/22 0405  Weight: 105.7 kg 105.7 kg 107 kg    Examination:  General exam: NAD Respiratory system: Some bibasilar scattered crackles.  Some diffuse expiratory wheezing.  No rhonchi.  Fair air movement.  Cardiovascular system: Regular regular rate rhythm with 3/6 SEM right upper sternal border.  Trace bilateral lower extremity edema.  Gastrointestinal system: Abdomen is soft, nontender, nondistended, positive bowel sounds.  No rebound.  No guarding. Central nervous system: Alert and oriented. No focal neurological deficits. Extremities: Symmetric 5 x 5 power. Skin: No rashes, lesions or ulcers Psychiatry: Judgement and insight appear normal. Mood & affect appropriate.     Data Reviewed: I have personally reviewed following labs and imaging studies  CBC: Recent Labs  Lab 11/16/22 1401  11/16/22 1524 11/17/22 0043 11/17/22 0623 11/18/22 0448  WBC 6.3 6.9  --   --  7.1  NEUTROABS 4.8  --   --   --   --   HGB 7.2* 7.4* 8.3* 8.1* 7.9*  HCT 24.7* 24.7*  --   --  25.4*  MCV 106.9* 106.9*  --   --  100.8*  PLT 197 198  --   --  179    Basic Metabolic Panel: Recent Labs  Lab 11/16/22 1524 11/18/22 0448  NA 141 141  K 4.6 4.1  CL 97* 95*  CO2 37* 34*  GLUCOSE 200* 203*  BUN 53* 32*  CREATININE 1.43* 1.28*  CALCIUM 8.4* 8.5*  MG  --  2.7*    GFR: Estimated Creatinine Clearance: 39.1 mL/min (A) (by C-G formula based on SCr of 1.28 mg/dL (H)).  Liver Function Tests: No results for input(s): "AST", "ALT", "ALKPHOS", "BILITOT", "PROT", "ALBUMIN" in the last 168 hours.  CBG: Recent Labs  Lab 11/17/22 1128 11/17/22 1659 11/17/22 2111 11/18/22 0749 11/18/22 1049  GLUCAP 107* 353* 283* 186* 239*     No results found for this or any previous visit (from the past 240 hour(s)).       Radiology Studies: CT ABDOMEN PELVIS W CONTRAST  Result Date: 11/16/2022 CLINICAL DATA:  Right-sided abdominal pain. Known hernia with known cancer. Non-small-cell lung cancer. * Tracking Code: BO * EXAM: CT ABDOMEN AND PELVIS WITH CONTRAST TECHNIQUE: Multidetector CT imaging of the abdomen and pelvis was performed using the standard protocol following bolus administration of intravenous contrast. RADIATION DOSE REDUCTION: This exam was performed according to the departmental dose-optimization program which includes automated exposure control, adjustment of the mA and/or kV according to patient size and/or use of iterative reconstruction technique. CONTRAST:  80mL OMNIPAQUE IOHEXOL 300 MG/ML  SOLN COMPARISON:  Abdomen and pelvis CT 03/17/2021. Chest CT scan 09/17/2022 FINDINGS: Lower chest: There is some basilar atelectasis. No pleural effusion. There is a 3 mm nodule medial left lower lobe on series 7, image 5. Calcifications along the mitral valve annulus. No pericardial effusion.  Coronary artery calcifications are seen. Hepatobiliary: Once again there are hepatic hemangiomas in segment 8 and 2 as on previous. Previous cholecystectomy. Patent portal vein. Pancreas: Moderate atrophy of the pancreas. Spleen: Normal in size without focal abnormality. Adrenals/Urinary Tract: The adrenal glands are preserved. No collecting system dilatation. The ureters have normal course and caliber extending down to the bladder. Preserved contours of the urinary bladder. Once again the kidneys has some Bosniak 1 renal cysts. Largest is seen exophytic from the anterior aspect of the right kidney measuring 3.5 cm. Hounsfield unit of 15. However there  is 1 lesion along the upper aspect of the left kidney laterally which is more complex. Precontrast density of 37 38, portal venous phase 52 and delayed with a somewhat thick potentially enhancing wall and Hounsfield units of 65. Possible solid mass or renal cell carcinoma. Recommend further workup when appropriate. No abnormal calcifications are seen within either kidney nor along the course of either ureter on the noncontrast dataset. Stomach/Bowel: Stomach is underdistended on this non oral contrast exam. Question fold thickening. Please correlate with symptoms. The small bowel is nondilated. There is extensive colonic diverticulosis without dilatation or obstruction. Normal appendix seen retrocecal. Vascular/Lymphatic: Extensive vascular calcifications along the aorta and branch vessels. Normal caliber aorta and IVC. No discrete abnormal lymph node enlargement identified in the abdomen and pelvis. Reproductive: Status post hysterectomy. No adnexal masses. Other: No free air or free fluid. There is mesh along the anterior abdominal wall. Musculoskeletal: Osteopenia. Scattered degenerative changes. Trace anterolisthesis of L5 on S1. Significant degenerative changes as well of the pelvis. IMPRESSION: Extensive colonic diverticulosis. Normal appendix. No bowel  obstruction, free air or free fluid. There is collapsed stomach but gastric fold thickening. Please correlate with symptoms. Bilateral renal cystic foci. There is 1 potentially enhancing lesion in the upper pole left kidney measuring 19 mm. Recommend dedicated MRI evaluation when appropriate to further assess and exclude underlying aggressive lesion. Known hepatic hemangiomas. Electronically Signed   By: Karen Kays M.D.   On: 11/16/2022 19:33   DG Chest 2 View  Result Date: 11/16/2022 CLINICAL DATA:  Shortness of breath. EXAM: CHEST - 2 VIEW COMPARISON:  Chest radiographs 07/05/2022, 06/29/2022; CT chest 09/17/2022 and 03/19/2022 FINDINGS: Cardiac silhouette is again mildly to mildly enlarged. Moderate atherosclerotic calcification is again seen within the aortic arch. There is again flattening of the diaphragms and mild hyperinflation. Unchanged opacity overlying the superomedial right lung corresponding to the chronic scarring seen on prior 09/17/2022 and 03/19/2022 CTs. Mild blunting of the posterior costophrenic angle is similar to 03/04/2022 and favored represent mild scarring. No definite pleural effusion. No pneumothorax. Moderate multilevel degenerative disc changes of the thoracic spine. IMPRESSION: Mild chronic hyperinflation.  No acute lung process. Electronically Signed   By: Neita Garnet M.D.   On: 11/16/2022 17:02        Scheduled Meds:  sodium chloride   Intravenous Once   acetaminophen  650 mg Oral Once   amiodarone  200 mg Oral Daily   arformoterol  15 mcg Nebulization QHS   bisoprolol  2.5 mg Oral Daily   budesonide (PULMICORT) nebulizer solution  0.5 mg Nebulization BID   busPIRone  5 mg Oral TID   cholecalciferol  2,000 Units Oral Daily   cyanocobalamin  5,000 mcg Oral Daily   ezetimibe  10 mg Oral Daily   ferrous sulfate  325 mg Oral Daily   FLUoxetine  20 mg Oral BID   fluticasone  2 spray Each Nare Daily   furosemide  20 mg Intravenous Q12H   furosemide  20 mg  Intravenous Once   influenza vaccine adjuvanted  0.5 mL Intramuscular Tomorrow-1000   insulin aspart  0-20 Units Subcutaneous TID WC   insulin aspart  0-5 Units Subcutaneous QHS   isosorbide mononitrate  30 mg Oral Daily   levalbuterol  0.63 mg Nebulization TID   levothyroxine  125 mcg Oral Q0600   loratadine  10 mg Oral Daily   methylPREDNISolone (SOLU-MEDROL) injection  60 mg Intravenous Q12H   pantoprazole  40 mg Oral Q0600  potassium chloride  10 mEq Oral Daily   rosuvastatin  10 mg Oral QODAY   Continuous Infusions:  cefTRIAXone (ROCEPHIN)  IV Stopped (11/17/22 2212)     LOS: 1 day    Time spent: 40 minutes    Ramiro Harvest, MD [facility]   To contact the attending provider between 7A-7P or the covering provider during after hours 7P-7A, please log into the web site [email or web address] and access using universal [place] password for that web site. If you do not have the password, please call the hospital operator.  11/18/2022, 10:56 AM

## 2022-11-18 NOTE — TOC Initial Note (Signed)
Transition of Care [facility]) - Initial/Assessment Note    Patient Details  Name: Alexandria [name] MRN: [id number] Date of Birth: December 15, 1935  Transition of [facility]) CM/SW Contact:    Chapman Fitch, RN Phone Number: 11/18/2022, 4:34 PM  Clinical Narrative:                   Admitted for: Anemia Admitted from: home.  Sister stays 3 nights a week.  Other nights care givers are in the home, and come in the day to provide meals.  Daughter [name] assist with medication management  PCP: Para March Pharmacy: Current home health/prior home health/DME: [facility], wears 3L home O2. Has RW  Therapy recommending SNF Patient states she is in agreement She requests that I speak with [name] her daughter  Per [name] they are only agreeable for referral to be sent to [facility]  Existing PASRR Fl2 sent for signature Referral sent to [facility]. Message left for Crystal at [facility]    Expected Discharge Plan: Skilled Nursing Facility     Patient Goals and CMS Choice            Expected Discharge Plan and Services                                              Prior Living Arrangements/Services                       Activities of Daily Living Home Assistive Devices/Equipment: Environmental consultant (specify type), Wheelchair, Shower chair with back, Other (Comment) ADL Screening (condition at time of admission) Patient's cognitive ability adequate to safely complete daily activities?: Yes Is the patient deaf or have difficulty hearing?: No Does the patient have difficulty seeing, even when wearing glasses/contacts?: Yes (has cataracts) Does the patient have difficulty concentrating, remembering, or making decisions?: No Patient able to express need for assistance with ADLs?: Yes Does the patient have difficulty dressing or bathing?: Yes Independently performs ADLs?: No Communication: Appropriate for developmental age, Independent (stair  climber) Dressing (OT): Appropriate for developmental age Does the patient have difficulty walking or climbing stairs?: Yes Weakness of Legs: Both Weakness of Arms/Hands: None (weakness in thumbs)  Permission Sought/Granted                  Emotional Assessment              Admission diagnosis:  Shortness of breath [R06.02] Acute blood loss anemia [D62] Symptomatic anemia [D64.9] Gastrointestinal hemorrhage, unspecified gastrointestinal hemorrhage type [K92.2] Patient Active Problem List   Diagnosis Date Noted   Symptomatic anemia 11/17/2022   Acute on chronic diastolic CHF (congestive heart failure) (HCC) 11/17/2022   COPD with acute exacerbation (HCC) 11/17/2022   Goals of care, counseling/discussion 10/05/2022   Stage 3b chronic kidney disease (HCC) 10/05/2022   Neurogenic orthostatic hypotension (HCC) 07/13/2022   Non-pressure chronic ulcer of other part of right lower leg with other specified severity (HCC) 07/13/2022   Atherosclerotic heart disease of native coronary artery with other forms of angina pectoris (HCC) 07/13/2022   Chronic diastolic (congestive) heart failure (HCC) 07/07/2022   Paroxysmal atrial fibrillation (HCC) 07/02/2022   Acute on chronic respiratory failure with hypoxia and hypercapnia (HCC) 06/29/2022   Acute encephalopathy 06/29/2022   Aortic stenosis, mild 03/30/2022   PVC's (premature ventricular contractions) 02/23/2022  Skin lesion 08/12/2021   Leg swelling 06/08/2021   Grief 05/27/2021   H/O laceration of skin 05/27/2021   PND (post-nasal drip) 04/27/2021   Wheezing 04/27/2021   Acute cough 04/27/2021   Physical deconditioning 01/30/2020   Healthcare maintenance 01/30/2020   PAD (peripheral artery disease) (HCC) 01/27/2020   Sore throat 11/21/2019   GERD (gastroesophageal reflux disease) 11/21/2019   CAD (coronary artery disease) 07/24/2019   Malignant neoplasm of right upper lobe of lung (HCC) 07/24/2019   Vitamin D deficiency  06/29/2018   Hyperkalemia 06/28/2018   Dysuria 01/01/2018   Abnormal findings on diagnostic imaging of lung 12/18/2017   Rhinitis 12/18/2017   Knee laceration, right, subsequent encounter 10/06/2016   Knee pain, acute 10/06/2016   Chronic pain of left knee 09/15/2016   Right foot pain 09/15/2016   Chronic respiratory failure with hypoxia (HCC) 07/14/2016   Diverticulosis of colon without hemorrhage    Hemorrhoids    Hiatal hernia    Type 2 diabetes mellitus with diabetic neuropathy, without long-term current use of insulin (HCC) 04/04/2015   Left temporal headache 03/14/2015   Vertigo 03/14/2015   Chest tightness 12/04/2014   Cystitis 11/03/2014   Acute on chronic heart failure with preserved ejection fraction (HFpEF) (HCC)    Iron deficiency anemia    OSA on CPAP    Obesity hypoventilation syndrome (HCC)    Chronic diastolic CHF (congestive heart failure) (HCC)    UTI (urinary tract infection)    Acute on chronic blood loss anemia    Medicare annual wellness visit, subsequent 06/14/2014   Advance care planning 06/14/2014   RLQ abdominal pain 05/10/2014   COPD exacerbation (HCC) 04/28/2014   Lower extremity edema 09/15/2012   DOE (dyspnea on exertion) 08/24/2012   Shoulder pain 12/17/2011   Abdominal hernia 12/17/2011   Anxiety 09/23/2011   Osteopenia 07/25/2011   Obstructive sleep apnea 02/09/2010   Murmur 02/04/2010   CHEST PAIN 01/13/2010   PALPITATIONS 08/29/2008   ESOPHAGEAL STENOSIS 11/22/2007   DYSPHAGIA 11/08/2007   ESOPHAGITIS 10/11/2007   Edema 07/03/2007   COPD with chronic bronchitis and emphysema (HCC) 05/23/2007   ALLERGY 07/25/2006   NUMBNESS 10/06/2001   Hyperlipemia 10/04/2000   Depression, recurrent (HCC) 03/08/1997   Essential hypertension 03/08/1992   Obesity, Class III, BMI 40-49.9 (morbid obesity) (HCC) 03/08/1989   ROTATOR CUFF SYNDROME, RIGHT 03/08/1982   Hypothyroidism 03/08/1968   PCP:  Joaquim Nam, MD Pharmacy:   [facility]  [phone] - [place], [place] - [address] [place] [address] Phone: [phone] Fax: [phone]  [facility]) - [place], [place] - [address] [facility] [address] [place] [address] [place] [address] Phone: [phone] Fax: [phone]  [facility] - [place], [place] - [address] [place] [id number] Phone: [phone] Fax: [phone]  [facility] - [place], [place] - [address] [place] [address] Phone: [phone] Fax: [phone]     Social Determinants of Health (SDOH) Social History: SDOH Screenings   Food Insecurity: No Food Insecurity (11/17/2022)  Housing: Low Risk  (11/17/2022)  Transportation Needs: No Transportation Needs (11/17/2022)  Utilities: Not At Risk (11/17/2022)  Alcohol Screen: Low Risk  (10/21/2022)  Depression (PHQ2-9): Low Risk  (10/21/2022)  Recent Concern: Depression (PHQ2-9) - Medium Risk (08/03/2022)  Financial Resource Strain: Low Risk  (10/21/2022)  Physical Activity: Insufficiently Active (10/21/2022)  Social Connections: Socially Isolated (10/21/2022)  Stress: No Stress Concern Present (10/21/2022)  Tobacco Use: Medium Risk (11/16/2022)  Health Literacy: Adequate Health Literacy (10/21/2022)   SDOH Interventions:     Readmission Risk Interventions     No data to display

## 2022-11-18 NOTE — Evaluation (Signed)
Physical Therapy Evaluation Patient Details Name: Alexandria [name] MRN: [id number] DOB: May 07, 1935 Today's Date: 11/18/2022  History of Present Illness  87 year old female history of COPD on home O2 3 L, HFpEF EF 60 to 65% 06/2022, CKD stage IIIb, diabetes, OSA not on CPAP, stage I NSCLC s/p radiation 2021, chronic iron deficiency anemia for several years with upper and lower endoscopy in 2017 showing hemorrhoids and diverticulosis without hemorrhage seen in the cancer center and sent to the ED due to anemia.  Patient presented with complaints of worsening shortness of breath than baseline as well as fatigue.  Clinical Impression  Pt reports significant recent weakness and fatigue, reports still feeling far from baseline.  She needed extra time and cuing for bed mobility and multiple attempts to get to standing but did manage to do so w/o physical assist.  She was able to do some guarded, slow ambulation with walker, quick to fatigue at 25-30 ft.  Pt will benefit from continued PT to address functional limitations and work back to PLOF.        If plan is discharge home, recommend the following: A little help with walking and/or transfers;A little help with bathing/dressing/bathroom;Assistance with cooking/housework;Assist for transportation   Can travel by private vehicle   No    Equipment Recommendations None recommended by PT  Recommendations for Other Services       Functional Status Assessment Patient has had a recent decline in their functional status and demonstrates the ability to make significant improvements in function in a reasonable and predictable amount of time.     Precautions / Restrictions Precautions Precautions: Fall      Mobility  Bed Mobility Overal bed mobility: Needs Assistance Bed Mobility: Supine to Sit     Supine to sit: Contact guard     General bed mobility comments: heavy use of bed rails, slowly and labored to get to sitting, cuing w/o direct  assist    Transfers Overall transfer level: Needs assistance Equipment used: Rolling walker (2 wheels) Transfers: Sit to/from Stand, Bed to chair/wheelchair/BSC Sit to Stand: Contact guard assist   Step pivot transfers: Contact guard assist, Min assist       General transfer comment: Pt unable to rise to standing on first attempt, cuing for set up/UE use/sequencing and able to rise to walker with heavy effort    Ambulation/Gait Ambulation/Gait assistance: Contact guard assist Gait Distance (Feet): 30 Feet Assistive device: Rolling walker (2 wheels)         General Gait Details: Pt with slow and deliberate gait, heavily reliant on the walker.  Quick to fatigue with request to sit at ~25 ft, encouraged a few feet more before needing to sit.  Stairs            Wheelchair Mobility     Tilt Bed    Modified Rankin (Stroke Patients Only)       Balance                                             Pertinent Vitals/Pain Pain Assessment Pain Assessment: No/denies pain    Home Living Family/patient expects to be discharged to:: Private residence Living Arrangements: Alone Available Help at Discharge: Personal care attendant;Other (Comment);Available PRN/intermittently (consistently there at night, during day PRN)   Home Access: Ramped entrance       Home Layout: Multi-level;Able to  live on main level with bedroom/bathroom Home Equipment: Rollator (4 wheels);BSC/3in1;Wheelchair - manual;Rolling Walker (2 wheels);Grab bars - toilet      Prior Function Prior Level of Function : Needs assist             Mobility Comments: has lift chairs (that she has slid off of) and can typically get around home with walker w/o assist ADLs Comments: Pt ambulates short distances to bathroom and kitchen with rollator and performs sink bath herself. Caregiver stays at night for safety. Family assists with IADLs such as groceries and medication management.      Extremity/Trunk Assessment   Upper Extremity Assessment Upper Extremity Assessment: Generalized weakness    Lower Extremity Assessment Lower Extremity Assessment: Generalized weakness       Communication   Communication Communication: No apparent difficulties  Cognition Arousal: Alert Behavior During Therapy: WFL for tasks assessed/performed Overall Cognitive Status: Within Functional Limits for tasks assessed                                          General Comments General comments (skin integrity, edema, etc.): Pt needing extra cuing and encouragement for participation, but ultimately able to do some mobility/ambulation    Exercises     Assessment/Plan    PT Assessment Patient needs continued PT services  PT Problem List Decreased strength;Decreased activity tolerance;Decreased balance;Decreased safety awareness;Decreased knowledge of use of DME;Cardiopulmonary status limiting activity       PT Treatment Interventions DME instruction;Gait training;Functional mobility training;Therapeutic activities;Therapeutic exercise;Balance training;Patient/family education    PT Goals (Current goals can be found in the Care Plan section)  Acute Rehab PT Goals Patient Stated Goal: get strong enough to go back home PT Goal Formulation: With patient/family Time For Goal Achievement: 01/21/23 Potential to Achieve Goals: Good    Frequency Min 1X/week     Co-evaluation               AM-PAC PT "6 Clicks" Mobility  Outcome Measure Help needed turning from your back to your side while in a flat bed without using bedrails?: A Little Help needed moving from lying on your back to sitting on the side of a flat bed without using bedrails?: A Little Help needed moving to and from a bed to a chair (including a wheelchair)?: A Little Help needed standing up from a chair using your arms (e.g., wheelchair or bedside chair)?: A Little Help needed to walk in  hospital room?: A Little Help needed climbing 3-5 steps with a railing? : A Lot 6 Click Score: 17    End of Session Equipment Utilized During Treatment: Gait belt Activity Tolerance: Patient limited by fatigue Patient left: with chair alarm set;with call bell/phone within reach Nurse Communication: Mobility status PT Visit Diagnosis: Unsteadiness on feet (R26.81);Muscle weakness (generalized) (M62.81);Difficulty in walking, not elsewhere classified (R26.2)    Time: 6387-5643 PT Time Calculation (min) (ACUTE ONLY): 31 min   Charges:   PT Evaluation $PT Eval Low Complexity: 1 Low PT Treatments $Gait Training: 8-22 mins PT General Charges $$ ACUTE PT VISIT: 1 Visit         Malachi Pro, DPT 11/18/2022, 4:36 PM

## 2022-11-18 NOTE — Evaluation (Signed)
Occupational Therapy Evaluation Patient Details Name: Alexandria [name] MRN: [id number] DOB: 1935/07/21 Today's Date: 11/18/2022   History of Present Illness 87 year old female history of COPD on home O2 3 L, HFpEF EF 60 to 65% 06/2022, CKD stage IIIb, diabetes, OSA not on CPAP, stage I NSCLC s/p radiation 2021, chronic iron deficiency anemia for several years with upper and lower endoscopy in 2017 showing hemorrhoids and diverticulosis without hemorrhage seen in the cancer center and sent to the ED due to anemia.  Patient presented with complaints of worsening shortness of breath than baseline as well as fatigue.   Clinical Impression   Patient presenting with decreased Ind in self care,balance, functional mobility/transfers, endurance, and safety awareness. Patient reports being Mod I at baseline with use of rollator for short household distances. Pt has caregiver at night.  Patient currently functioning at [facility]- min A for mobility and self care tasks. Pt on 3Ls via [place] and normally on 2Ls chronically.  Patient will benefit from acute OT to increase overall independence in the areas of ADLs, functional mobility, and safety awareness in order to safely discharge.      If plan is discharge home, recommend the following: A little help with walking and/or transfers;A little help with bathing/dressing/bathroom;Assistance with cooking/housework;Assist for transportation;Help with stairs or ramp for entrance;Direct supervision/assist for financial management;Direct supervision/assist for medications management    Functional Status Assessment  Patient has had a recent decline in their functional status and demonstrates the ability to make significant improvements in function in a reasonable and predictable amount of time.  Equipment Recommendations  None recommended by OT       Precautions / Restrictions Precautions Precautions: Fall      Mobility Bed Mobility               General bed  mobility comments: seated in recliner chair    Transfers Overall transfer level: Needs assistance Equipment used: Rolling walker (2 wheels) Transfers: Sit to/from Stand, Bed to chair/wheelchair/BSC Sit to Stand: Contact guard assist     Step pivot transfers: Contact guard assist, Min assist            Balance Overall balance assessment: Needs assistance Sitting-balance support: Feet supported Sitting balance-[name]: Good     Standing balance support: Reliant on assistive device for balance, During functional activity, Bilateral upper extremity supported Standing balance-[name]: Fair                             ADL either performed or assessed with clinical judgement   ADL Overall ADL's : Needs assistance/impaired                         Toilet Transfer: Minimal assistance Toilet Transfer Details (indicate cue type and reason): simulated                 Vision Patient Visual Report: No change from baseline              Pertinent Vitals/Pain Pain Assessment Pain Assessment: No/denies pain     Extremity/Trunk Assessment Upper Extremity Assessment Upper Extremity Assessment: Generalized weakness   Lower Extremity Assessment Lower Extremity Assessment: Generalized weakness       Communication Communication Communication: No apparent difficulties   Cognition Arousal: Alert Behavior During Therapy: WFL for tasks assessed/performed Overall Cognitive Status: Within Functional Limits for tasks assessed  Home Living Family/patient expects to be discharged to:: Private residence Living Arrangements: Alone Available Help at Discharge: Personal care attendant;Other (Comment) (pt has paid caregiver during the night)   Home Access: Ramped entrance     Home Layout: Multi-level;Able to live on main level with bedroom/bathroom     Bathroom Shower/Tub:  Sponge bathes at baseline   Bathroom Toilet: Standard     Home Equipment: Rollator (4 wheels);BSC/3in1;Wheelchair - manual          Prior Functioning/Environment Prior Level of Function : Needs assist               ADLs Comments: Pt ambulates short distances to bathroom and kitchen with rollator and performs sink bath herself. Caregiver stays at night for safety. Family assists with IADLs such as groceries and medication management.        OT Problem List: Decreased strength;Decreased activity tolerance;Decreased safety awareness;Impaired balance (sitting and/or standing);Decreased knowledge of use of DME or AE      OT Treatment/Interventions: Self-care/ADL training;Therapeutic exercise;Therapeutic activities;Energy conservation;DME and/or AE instruction;Patient/family education;Balance training    OT Goals(Current goals can be found in the care plan section) Acute Rehab OT Goals Patient Stated Goal: to go to rehab OT Goal Formulation: With patient/family Time For Goal Achievement: 12/02/22 Potential to Achieve Goals: Fair ADL Goals Pt Will Perform Grooming: with modified independence Pt Will Perform Lower Body Dressing: with modified independence;sit to/from stand Pt Will Transfer to Toilet: with modified independence Pt Will Perform Toileting - Clothing Manipulation and hygiene: with modified independence  OT Frequency: Min 1X/week       AM-PAC OT "6 Clicks" Daily Activity     Outcome Measure Help from another person eating meals?: None Help from another person taking care of personal grooming?: A Little Help from another person toileting, which includes using toliet, bedpan, or urinal?: A Little Help from another person bathing (including washing, rinsing, drying)?: A Little Help from another person to put on and taking off regular upper body clothing?: None Help from another person to put on and taking off regular lower body clothing?: A Little 6 Click Score:  20   End of Session Equipment Utilized During Treatment: Rolling walker (2 wheels) Nurse Communication: Mobility status  Activity Tolerance: Patient tolerated treatment well Patient left: with call bell/phone within reach;in chair;with chair alarm set  OT Visit Diagnosis: Unsteadiness on feet (R26.81);Muscle weakness (generalized) (M62.81);History of falling (Z91.81)                Time: 1610-9604 OT Time Calculation (min): 22 min Charges:  OT General Charges $OT Visit: 1 Visit OT Evaluation $OT Eval Moderate Complexity: 1 [address] [place], OTR/L , CBIS ascom [phone]  11/18/22, 4:12 PM

## 2022-11-18 NOTE — NC FL2 (Signed)
[place] MEDICAID FL2 LEVEL OF CARE FORM     IDENTIFICATION  Patient Name: Alexandria [name] Birthdate: 1935-04-22 Sex: female Admission Date (Current Location): 11/16/2022  [facility] and [place] Number:  Chiropodist and Address:         Provider Number: [id number]  Attending Physician Name and Address:  Rodolph Bong, MD  Relative Name and Phone Number:       Current Level of Care:   Recommended Level of Care: Skilled Nursing Facility Prior Approval Number:    Date Approved/Denied:   PASRR Number: [id number]  Discharge Plan: SNF    Current Diagnoses: Patient Active Problem List   Diagnosis Date Noted   Symptomatic anemia 11/17/2022   Acute on chronic diastolic CHF (congestive heart failure) (HCC) 11/17/2022   COPD with acute exacerbation (HCC) 11/17/2022   Goals of care, counseling/discussion 10/05/2022   Stage 3b chronic kidney disease (HCC) 10/05/2022   Neurogenic orthostatic hypotension (HCC) 07/13/2022   Non-pressure chronic ulcer of other part of right lower leg with other specified severity (HCC) 07/13/2022   Atherosclerotic heart disease of native coronary artery with other forms of angina pectoris (HCC) 07/13/2022   Chronic diastolic (congestive) heart failure (HCC) 07/07/2022   Paroxysmal atrial fibrillation (HCC) 07/02/2022   Acute on chronic respiratory failure with hypoxia and hypercapnia (HCC) 06/29/2022   Acute encephalopathy 06/29/2022   Aortic stenosis, mild 03/30/2022   PVC's (premature ventricular contractions) 02/23/2022   Skin lesion 08/12/2021   Leg swelling 06/08/2021   Grief 05/27/2021   H/O laceration of skin 05/27/2021   PND (post-nasal drip) 04/27/2021   Wheezing 04/27/2021   Acute cough 04/27/2021   Physical deconditioning 01/30/2020   Healthcare maintenance 01/30/2020   PAD (peripheral artery disease) (HCC) 01/27/2020   Sore throat 11/21/2019   GERD (gastroesophageal reflux disease) 11/21/2019   CAD (coronary artery  disease) 07/24/2019   Malignant neoplasm of right upper lobe of lung (HCC) 07/24/2019   Vitamin D deficiency 06/29/2018   Hyperkalemia 06/28/2018   Dysuria 01/01/2018   Abnormal findings on diagnostic imaging of lung 12/18/2017   Rhinitis 12/18/2017   Knee laceration, right, subsequent encounter 10/06/2016   Knee pain, acute 10/06/2016   Chronic pain of left knee 09/15/2016   Right foot pain 09/15/2016   Chronic respiratory failure with hypoxia (HCC) 07/14/2016   Diverticulosis of colon without hemorrhage    Hemorrhoids    Hiatal hernia    Type 2 diabetes mellitus with diabetic neuropathy, without long-term current use of insulin (HCC) 04/04/2015   Left temporal headache 03/14/2015   Vertigo 03/14/2015   Chest tightness 12/04/2014   Cystitis 11/03/2014   Acute on chronic heart failure with preserved ejection fraction (HFpEF) (HCC)    Iron deficiency anemia    OSA on CPAP    Obesity hypoventilation syndrome (HCC)    Chronic diastolic CHF (congestive heart failure) (HCC)    UTI (urinary tract infection)    Acute on chronic blood loss anemia    Medicare annual wellness visit, subsequent 06/14/2014   Advance care planning 06/14/2014   RLQ abdominal pain 05/10/2014   COPD exacerbation (HCC) 04/28/2014   Lower extremity edema 09/15/2012   DOE (dyspnea on exertion) 08/24/2012   Shoulder pain 12/17/2011   Abdominal hernia 12/17/2011   Anxiety 09/23/2011   Osteopenia 07/25/2011   Obstructive sleep apnea 02/09/2010   Murmur 02/04/2010   CHEST PAIN 01/13/2010   PALPITATIONS 08/29/2008   ESOPHAGEAL STENOSIS 11/22/2007   DYSPHAGIA 11/08/2007   ESOPHAGITIS 10/11/2007  Edema 07/03/2007   COPD with chronic bronchitis and emphysema (HCC) 05/23/2007   ALLERGY 07/25/2006   NUMBNESS 10/06/2001   Hyperlipemia 10/04/2000   Depression, recurrent (HCC) 03/08/1997   Essential hypertension 03/08/1992   Obesity, Class III, BMI 40-49.9 (morbid obesity) (HCC) 03/08/1989   ROTATOR CUFF  SYNDROME, RIGHT 03/08/1982   Hypothyroidism 03/08/1968    Orientation RESPIRATION BLADDER Height & Weight     Self, Time, Situation, Place  O2 (3L)   Weight: 107 kg Height:  5\' 5"  (165.1 cm)  BEHAVIORAL SYMPTOMS/MOOD NEUROLOGICAL BOWEL NUTRITION STATUS      Continent Diet (Heart Healthy)  AMBULATORY STATUS COMMUNICATION OF NEEDS Skin   Limited Assist Verbally Normal                       Personal Care Assistance Level of Assistance              Functional Limitations Info             SPECIAL CARE FACTORS FREQUENCY  PT (By licensed PT), OT (By licensed OT)                    Contractures Contractures Info: Not present    Additional Factors Info  Code Status, Allergies Code Status Info: Full Allergies Info: Antihistamines, Diphenhydramine-type, Incruse Ellipta (Umeclidinium Bromide), Clarithromycin, Codeine, Diphenhydramine, Pregabalin, Sulfonamide Derivatives, Tiotropium Bromide Monohydrate, Umeclidinium, Vraylar (Cariprazine)           Current Medications (11/18/2022):  This is the current hospital active medication list Current Facility-Administered Medications  Medication Dose Route Frequency Provider Last Rate Last Admin   acetaminophen (TYLENOL) tablet 650 mg  650 mg Oral Q6H PRN Andris Baumann, MD       Or   acetaminophen (TYLENOL) suppository 650 mg  650 mg Rectal Q6H PRN Andris Baumann, MD       amiodarone (PACERONE) tablet 200 mg  200 mg Oral Daily Rodolph Bong, MD   200 mg at 11/18/22 0817   arformoterol (BROVANA) nebulizer solution 15 mcg  15 mcg Nebulization QHS Rodolph Bong, MD       bisoprolol (ZEBETA) tablet 2.5 mg  2.5 mg Oral Daily Lindajo Royal V, MD   2.5 mg at 11/18/22 1035   budesonide (PULMICORT) nebulizer solution 0.5 mg  0.5 mg Nebulization BID Rodolph Bong, MD   0.5 mg at 11/18/22 0730   busPIRone (BUSPAR) tablet 5 mg  5 mg Oral TID Rodolph Bong, MD   5 mg at 11/18/22 0817   cefTRIAXone (ROCEPHIN) 2 g  in sodium chloride 0.9 % 100 mL IVPB  2 g Intravenous Q24H Rodolph Bong, MD   Stopped at 11/17/22 2212   cholecalciferol (VITAMIN D3) 25 MCG (1000 UNIT) tablet 2,000 Units  2,000 Units Oral Daily Andris Baumann, MD   2,000 Units at 11/18/22 0454   cyanocobalamin (VITAMIN B12) tablet 5,000 mcg  5,000 mcg Oral Daily Andris Baumann, MD   5,000 mcg at 11/18/22 0817   ezetimibe (ZETIA) tablet 10 mg  10 mg Oral Daily Rodolph Bong, MD   10 mg at 11/18/22 1036   ferrous sulfate tablet 325 mg  325 mg Oral Daily Andris Baumann, MD   325 mg at 11/18/22 0817   FLUoxetine (PROZAC) capsule 20 mg  20 mg Oral BID Rodolph Bong, MD   20 mg at 11/18/22 1036   fluticasone (FLONASE) 50 MCG/ACT nasal spray 2 spray  2 spray Each Nare Daily Rodolph Bong, MD   2 spray at 11/18/22 7829   furosemide (LASIX) injection 20 mg  20 mg Intravenous Q12H Rodolph Bong, MD   20 mg at 11/18/22 5621   furosemide (LASIX) injection 20 mg  20 mg Intravenous Once Rodolph Bong, MD       influenza vaccine adjuvanted (FLUAD) injection 0.5 mL  0.5 mL Intramuscular Tomorrow-1000 Rodolph Bong, MD       insulin aspart (novoLOG) injection 0-20 Units  0-20 Units Subcutaneous TID WC Andris Baumann, MD   7 Units at 11/18/22 1115   insulin aspart (novoLOG) injection 0-5 Units  0-5 Units Subcutaneous QHS Andris Baumann, MD   3 Units at 11/17/22 2135   [START ON 11/19/2022] isosorbide mononitrate (IMDUR) 24 hr tablet 15 mg  15 mg Oral Daily Rodolph Bong, MD       levalbuterol Pauline Aus) nebulizer solution 0.63 mg  0.63 mg Nebulization TID Rodolph Bong, MD   0.63 mg at 11/18/22 1311   levalbuterol (XOPENEX) nebulizer solution 0.63 mg  0.63 mg Nebulization Q2H PRN Rodolph Bong, MD       levothyroxine (SYNTHROID) tablet 125 mcg  125 mcg Oral Q0600 Andris Baumann, MD   125 mcg at 11/18/22 0645   loratadine (CLARITIN) tablet 10 mg  10 mg Oral Daily Rodolph Bong, MD   10 mg at 11/18/22 0818    methylPREDNISolone sodium succinate (SOLU-MEDROL) 125 mg/2 mL injection 60 mg  60 mg Intravenous Q12H Rodolph Bong, MD   60 mg at 11/18/22 3086   nitroGLYCERIN (NITROSTAT) SL tablet 0.4 mg  0.4 mg Sublingual Q5 min PRN Andris Baumann, MD       ondansetron [facility]) tablet 4 mg  4 mg Oral Q6H PRN Andris Baumann, MD       Or   ondansetron [facility]) injection 4 mg  4 mg Intravenous Q6H PRN Andris Baumann, MD       pantoprazole (PROTONIX) EC tablet 40 mg  40 mg Oral Q0600 Rodolph Bong, MD   40 mg at 11/18/22 0645   potassium chloride (KLOR-CON M) CR tablet 10 mEq  10 mEq Oral Daily Andris Baumann, MD   10 mEq at 11/18/22 0817   rosuvastatin (CRESTOR) tablet 10 mg  10 mg Oral Bedelia Person, MD   10 mg at 11/18/22 5784   Facility-Administered Medications Ordered in Other Encounters  Medication Dose Route Frequency Provider Last Rate Last Admin   0.9 %  sodium chloride infusion   Intravenous Once Louretta Shorten R, MD       0.9 %  sodium chloride infusion   Intravenous Continuous Earna Coder, MD 20 mL/hr at 11/02/22 1421 New Bag at 11/02/22 1421     Discharge Medications: Please see discharge summary for a list of discharge medications.  Relevant Imaging Results:  Relevant Lab Results:   Additional Information SS# [id number]  Chapman Fitch, RN

## 2022-11-18 NOTE — Inpatient Diabetes Management (Signed)
Inpatient Diabetes Program Recommendations  AACE/ADA: New Consensus Statement on Inpatient Glycemic Control   Target Ranges:  Prepandial:   less than 140 mg/dL      Peak postprandial:   less than 180 mg/dL (1-2 hours)      Critically ill patients:  140 - 180 mg/dL    Latest Reference Range & Units 11/17/22 07:32 11/17/22 11:28 11/17/22 16:59 11/17/22 21:11 11/18/22 07:49  Glucose-Capillary 70 - 99 mg/dL 010 (H) 932 (H) 355 (H) 283 (H) 186 (H)    Latest Reference Range & Units 11/17/22 00:43  Hemoglobin A1C 4.8 - 5.6 % 5.2   (H): Data is abnormally high  Review of Glycemic Control  Diabetes history: DM2 Outpatient Diabetes medications: Metformin 500 mg QPM Current orders for Inpatient glycemic control: Novolog 0-20 units TID with meals, Novolog 0-5 units at bedtime; Solumedrol 60 mg Q12H  Inpatient Diabetes Program Recommendations:    Insulin: If steroids are continued, please consider ordering Novolog 4 units TID with meals for meal coverage if patient eats at least 50% of meals.  Thanks, Orlando Penner, RN, MSN, CDCES Diabetes Coordinator Inpatient Diabetes Program [phone] (Team Pager from 8am to 5pm)

## 2022-11-19 ENCOUNTER — Encounter (HOSPITAL_BASED_OUTPATIENT_CLINIC_OR_DEPARTMENT_OTHER): Payer: Self-pay | Admitting: Pulmonary Disease

## 2022-11-19 ENCOUNTER — Ambulatory Visit: Payer: Medicare Other | Admitting: Family Medicine

## 2022-11-19 DIAGNOSIS — K5900 Constipation, unspecified: Secondary | ICD-10-CM

## 2022-11-19 DIAGNOSIS — D62 Acute posthemorrhagic anemia: Secondary | ICD-10-CM | POA: Diagnosis not present

## 2022-11-19 DIAGNOSIS — I5032 Chronic diastolic (congestive) heart failure: Secondary | ICD-10-CM | POA: Diagnosis not present

## 2022-11-19 DIAGNOSIS — I480 Paroxysmal atrial fibrillation: Secondary | ICD-10-CM | POA: Diagnosis not present

## 2022-11-19 DIAGNOSIS — J4489 Other specified chronic obstructive pulmonary disease: Secondary | ICD-10-CM | POA: Diagnosis not present

## 2022-11-19 LAB — MAGNESIUM: Magnesium: 2.4 mg/dL (ref 1.7–2.4)

## 2022-11-19 LAB — GLUCOSE, CAPILLARY
Glucose-Capillary: 166 mg/dL — ABNORMAL HIGH (ref 70–99)
Glucose-Capillary: 283 mg/dL — ABNORMAL HIGH (ref 70–99)
Glucose-Capillary: 135 mg/dL — ABNORMAL HIGH (ref 70–99)
Glucose-Capillary: 206 mg/dL — ABNORMAL HIGH (ref 70–99)

## 2022-11-19 LAB — BASIC METABOLIC PANEL
Anion gap: 14 (ref 5–15)
BUN: 34 mg/dL — ABNORMAL HIGH (ref 8–23)
CO2: 34 mmol/L — ABNORMAL HIGH (ref 22–32)
Calcium: 8.3 mg/dL — ABNORMAL LOW (ref 8.9–10.3)
Chloride: 93 mmol/L — ABNORMAL LOW (ref 98–111)
Creatinine, Ser: 1.32 mg/dL — ABNORMAL HIGH (ref 0.44–1.00)
GFR, Estimated: 40 mL/min — ABNORMAL LOW (ref >60)
Glucose, Bld: 278 mg/dL — ABNORMAL HIGH (ref 70–99)
Potassium: 3.8 mmol/L (ref 3.5–5.1)
Sodium: 141 mmol/L (ref 135–145)

## 2022-11-19 LAB — CBC
HCT: 30 % — ABNORMAL LOW (ref 36.0–46.0)
Hemoglobin: 9.3 g/dL — ABNORMAL LOW (ref 12.0–15.0)
MCH: 31.4 pg (ref 26.0–34.0)
MCHC: 31 g/dL (ref 30.0–36.0)
MCV: 101.4 fL — ABNORMAL HIGH (ref 80.0–100.0)
Platelets: 195 K/uL (ref 150–400)
RBC: 2.96 MIL/uL — ABNORMAL LOW (ref 3.87–5.11)
RDW: 17.3 % — ABNORMAL HIGH (ref 11.5–15.5)
WBC: 9.2 K/uL (ref 4.0–10.5)
nRBC: 0.3 % — ABNORMAL HIGH (ref 0.0–0.2)

## 2022-11-19 LAB — HEMOGLOBIN A1C
Hgb A1c MFr Bld: 5.3 % (ref 4.8–5.6)
Mean Plasma Glucose: 105 mg/dL

## 2022-11-19 MED ORDER — METHYLPREDNISOLONE SODIUM SUCC 125 MG IJ SOLR
60.0000 mg | Freq: Every day | INTRAMUSCULAR | Status: DC
  Administered 2022-11-20: 60 mg via INTRAVENOUS
  Filled 2022-11-19: qty 2

## 2022-11-19 MED ORDER — FUROSEMIDE 10 MG/ML IJ SOLN
40.0000 mg | Freq: Two times a day (BID) | INTRAMUSCULAR | Status: DC
  Administered 2022-11-19 – 2022-11-21 (×4): 40 mg via INTRAVENOUS
  Filled 2022-11-19 (×4): qty 4

## 2022-11-19 MED ORDER — LEVALBUTEROL HCL 0.63 MG/3ML IN NEBU
0.6300 mg | INHALATION_SOLUTION | Freq: Two times a day (BID) | RESPIRATORY_TRACT | Status: DC
  Administered 2022-11-19 – 2022-11-26 (×13): 0.63 mg via RESPIRATORY_TRACT
  Filled 2022-11-19 (×15): qty 3

## 2022-11-19 MED ORDER — POLYETHYLENE GLYCOL 3350 17 G PO PACK
17.0000 g | PACK | Freq: Two times a day (BID) | ORAL | Status: DC
  Filled 2022-11-19: qty 1

## 2022-11-19 MED ORDER — BISACODYL 10 MG RE SUPP
10.0000 mg | Freq: Once | RECTAL | Status: DC
  Filled 2022-11-19: qty 1

## 2022-11-19 MED ORDER — SORBITOL 70 % SOLN
30.0000 mL | Status: DC
  Filled 2022-11-19 (×2): qty 30

## 2022-11-19 MED ORDER — FUROSEMIDE 10 MG/ML IJ SOLN
20.0000 mg | Freq: Once | INTRAMUSCULAR | Status: AC
  Administered 2022-11-19: 20 mg via INTRAVENOUS
  Filled 2022-11-19: qty 4

## 2022-11-19 MED ORDER — POLYETHYLENE GLYCOL 3350 17 G PO PACK
17.0000 g | PACK | Freq: Every day | ORAL | Status: DC
  Administered 2022-11-20 – 2022-11-26 (×7): 17 g via ORAL
  Filled 2022-11-19 (×6): qty 1

## 2022-11-19 MED ORDER — SENNOSIDES-DOCUSATE SODIUM 8.6-50 MG PO TABS
1.0000 | ORAL_TABLET | Freq: Every day | ORAL | Status: DC
  Administered 2022-11-19 – 2022-11-25 (×7): 1 via ORAL
  Filled 2022-11-19 (×7): qty 1

## 2022-11-19 NOTE — Plan of Care (Signed)
  Problem: Education: Goal: Knowledge of General Education information will improve Description Including pain rating scale, medication(s)/side effects and non-pharmacologic comfort measures Outcome: Progressing   Problem: Health Behavior/Discharge Planning: Goal: Ability to manage health-related needs will improve Outcome: Progressing   

## 2022-11-19 NOTE — Plan of Care (Signed)

## 2022-11-19 NOTE — Progress Notes (Signed)
PROGRESS NOTE    Alexandria [name]  UEA:[id number] DOB: 03/04/1936 DOA: 11/16/2022 PCP: Joaquim Nam, MD    Chief Complaint  Patient presents with   Anemia    Brief Narrative:  Patient is a 87 year old female history of COPD on home O2 3 L, HFpEF EF 60 to 65% 06/2022, CKD stage IIIb, diabetes, OSA not on CPAP, stage I NSCLC s/p radiation 2021, chronic iron deficiency anemia for several years with upper and lower endoscopy in 2017 showing hemorrhoids and diverticulosis without hemorrhage seen in the cancer center and sent to the ED due to anemia.  Patient presented with complaints of worsening shortness of breath than baseline as well as fatigue.  Hemoglobin noted at PCPs office and 7.4 down from a baseline of about 10 just 3 weeks prior to admission.  Patient did endorse some dark stools but not black in color.  Patient noted per records to have been seen by [place], GI on 08/2022, capsule endoscopy recommended however daughter declined due to risk of procedure if positive.  It is noted in 2023 the patient not considered for further endoscopic evaluation as he was 40 pose more risk than benefit.  Plan from GI was to continue monitoring with iron infusion and transfusion as needed.  CT abdomen and pelvis done on admission with extensive colonic diverticulosis.  Patient transfused a unit of PRBCs in the ED however continued to be dyspneic and patient admitted for further evaluation.   Assessment & Plan:   Principal Problem:   Acute on chronic blood loss anemia Active Problems:   DOE (dyspnea on exertion)   COPD with chronic bronchitis and emphysema (HCC)   Chronic respiratory failure with hypoxia (HCC)   OSA on CPAP   Malignant neoplasm of right upper lobe of lung (HCC)   Essential hypertension   Chronic diastolic (congestive) heart failure (HCC)   CAD (coronary artery disease)   Paroxysmal atrial fibrillation (HCC)   PAD (peripheral artery disease) (HCC)   Type 2 diabetes mellitus with  diabetic neuropathy, without long-term current use of insulin (HCC)   Stage 3b chronic kidney disease (HCC)   Hypothyroidism   Obesity, Class III, BMI 40-49.9 (morbid obesity) (HCC)   Depression, recurrent (HCC)   Symptomatic anemia   Acute on chronic diastolic CHF (congestive heart failure) (HCC)   COPD with acute exacerbation (HCC)  #1 acute on chronic blood loss anemia/chronic iron deficiency anemia -??  Etiology. -Patient denies any significant overt bleeding. -Patient noted to have had an EGD/colonoscopy 2017 without any etiology. -Patient seen by GI 08/2022 capsule endoscopy offered but patient and family declined.  Declined upper and lower endoscopy 2023. -Status post transfusion 2 unit PRBCs during this hospitalization with hemoglobin currently at 9.3 from 9.6 from 7.9 from 8.1 from 7.2 on admission. -Status post IV Venofer. -Follow H&H. -Transfusion threshold hemoglobin < 8.  2.  Dyspnea on exertion/?  Symptomatic anemia/chronic respiratory failure on home O2 3 L/probable acute COPD exacerbation/OSA intolerant of CPAP/stage I NSCLC s/p radiation 2021/??  Acute on chronic diastolic CHF -Patient's dyspnea on exertion likely multifactorial secondary to symptomatic anemia in the setting of probable acute COPD exacerbation and concern for volume overload. -Per daughter patient with a 11 pound weight gain over the past 10 to 11 days. -BP noted to be borderline on admission. -BNP at 155.4. -Status post transfusion 2 unit PRBCs however per patient no significant change in dyspnea on exertion. -Patient with some wheezing noted on examination as such patient started treatment  for acute COPD exacerbation.   -Hemoglobin at 9.3 this morning after transfusion of a total of 2 units PRBCs during this hospitalization.  -Decrease Solu-Medrol to 60 mg IV daily.  Continue scheduled Xopenex nebs, Pulmicort, Brovana, PPI, Flonase.   -Increase Lasix to 40 mg IV every 12 hours.   -Strict I's and O's,  daily weights.   3.  Hyperglycemia/diabetes mellitus type 2 -Likely secondary to steroids. -Patient noted to have a hemoglobin A1c 5.3 (03/16/2022) -Repeat hemoglobin A1c of 5.2. -CBG 166 this morning.   -SSI.  4. Acute on chronic diastolic CHF -Per daughter patient with a 11 pound weight gain over the past 10 to 11 days with baseline weight approximately in the 220s. -Patient with recent 2D echo 06/30/2022. -Weight on presentation was 233 pounds.  -BNP of 155.4. -Current weight of 231.92 pounds.  -Patient with urine output of 2.1 L over the past 24 hours. -Patient still volume overloaded on exam with crackles noted. -Imdur decreased to 50 mg daily to allow for better diuresis. -Increase Lasix to 40 mg IV every 12 hours. -Continue bisoprolol however if heart rate remains bradycardic will discontinue bisoprolol. -Strict I's and O's, daily weights.  5.  Hypertension -Continue bisoprolol, Lasix, Imdur.   6.  Paroxysmal A-fib/sinus bradycardia -Patient noted to have new onset A-fib in 2024 that converted with IV amiodarone. -Not a anticoagulation candidate in the setting of anemia and high fall risk. -Continue home regimen amiodarone.  Heart rate ranging from the 50s to the 70s on low-dose bisoprolol.  -Monitor heart rate on bisoprolol and if bradycardic will discontinue bisoprolol for now.  7.  PAD -Statin, Zetia.  8.  CKD stage IIIb -Stable.  9.  Depression -Continue Prozac.   -Continue BuSpar 5 mg 3 times daily.  10.  Hypothyroidism Synthroid.    11.  Morbid obesity -Lifestyle modification -Outpatient follow-up with PCP.  12.  Constipation -Placed on MiraLAX daily, Senokot-S nightly. -Sorbitol p.o. every 3 hours x 2 doses, Dulcolax suppository ordered however per RN patient noted to have a large bowel movement and as such sorbitol and Dulcolax suppositories have been discontinued.     DVT prophylaxis: SCDs Code Status: Full Family Communication: Updated patient.   Updated sister at bedside.  Updated daughter, [name] on speaker phone.  Disposition: TBD  Status is: Inpatient The patient will require care spanning > 2 midnights and should be moved to inpatient because: Severity of illness   Consultants:  None  Procedures:  CT abdomen and pelvis 11/16/2022 Chest x-ray 11/16/2022 Transfusion 1 unit PRBCs 11/16/2022 Transfusion 1 unit PRBC 11/18/2022  Antimicrobials:  Anti-infectives (From admission, onward)    Start     Dose/Rate Route Frequency Ordered Stop   11/17/22 2100  cefTRIAXone (ROCEPHIN) 2 g in sodium chloride 0.9 % 100 mL IVPB        2 g 200 mL/hr over 30 Minutes Intravenous Every 24 hours 11/17/22 1949 11/22/22 2059         Subjective: Patient sitting up on bedside commode.  Complaining of constipation states has not had a bowel movement in 3 days.  Feels no significant change with her shortness of breath however feels little minimal improvement.  Noted to have ambulated with PT yesterday on home O2 of 3 L with sats remaining in the mid 90s.  Denies any chest pain.  Sister at bedside.    Objective: Vitals:   11/18/22 2034 11/19/22 0353 11/19/22 0714 11/19/22 0744  BP:  (!) 148/74  (!) 127/56  Pulse:  62  65  Resp:  18    Temp:  98.1 F (36.7 C)  98 F (36.7 C)  TempSrc:  Oral  Oral  SpO2: 97% 98% 98% 98%  Weight:  105.2 kg    Height:        Intake/Output Summary (Last 24 hours) at 11/19/2022 1059 Last data filed at 11/19/2022 1042 Gross per 24 hour  Intake 564 ml  Output 1600 ml  Net -1036 ml   Filed Weights   11/17/22 1346 11/18/22 0405 11/19/22 0353  Weight: 105.7 kg 107 kg 105.2 kg    Examination:  General exam: NAD. Respiratory system: Scattered crackles noted.  Decreased expiratory wheezing.  No rhonchi.  Fair air movement.  Speaking in full sentences.  Cardiovascular system: RRR with 3/6 SEM right upper sternal border.  Trace bilateral lower extremity edema. Gastrointestinal system: Abdomen is soft,  nontender, nondistended, positive bowel sounds.  No rebound.  No guarding.  Central nervous system: Alert and oriented. No focal neurological deficits. Extremities: Symmetric 5 x 5 power. Skin: No rashes, lesions or ulcers Psychiatry: Judgement and insight appear normal. Mood & affect appropriate.     Data Reviewed: I have personally reviewed following labs and imaging studies  CBC: Recent Labs  Lab 11/16/22 1401 11/16/22 1524 11/17/22 0043 11/17/22 0623 11/18/22 0448 11/18/22 1923 11/19/22 0329  WBC 6.3 6.9  --   --  7.1 12.3* 9.2  NEUTROABS 4.8  --   --   --   --   --   --   HGB 7.2* 7.4* 8.3* 8.1* 7.9* 9.6* 9.3*  HCT 24.7* 24.7*  --   --  25.4* 30.8* 30.0*  MCV 106.9* 106.9*  --   --  100.8* 101.3* 101.4*  PLT 197 198  --   --  179 209 195    Basic Metabolic Panel: Recent Labs  Lab 11/16/22 1524 11/18/22 0448 11/19/22 0329  NA 141 141 141  K 4.6 4.1 3.8  CL 97* 95* 93*  CO2 37* 34* 34*  GLUCOSE 200* 203* 278*  BUN 53* 32* 34*  CREATININE 1.43* 1.28* 1.32*  CALCIUM 8.4* 8.5* 8.3*  MG  --  2.7* 2.4    GFR: Estimated Creatinine Clearance: 37.5 mL/min (A) (by C-G formula based on SCr of 1.32 mg/dL (H)).  Liver Function Tests: No results for input(s): "AST", "ALT", "ALKPHOS", "BILITOT", "PROT", "ALBUMIN" in the last 168 hours.  CBG: Recent Labs  Lab 11/18/22 0749 11/18/22 1049 11/18/22 1643 11/18/22 2112 11/19/22 0858  GLUCAP 186* 239* 197* 280* 166*     No results found for this or any previous visit (from the past 240 hour(s)).       Radiology Studies: No results found.      Scheduled Meds:  amiodarone  200 mg Oral Daily   arformoterol  15 mcg Nebulization QHS   bisoprolol  2.5 mg Oral Daily   budesonide (PULMICORT) nebulizer solution  0.5 mg Nebulization BID   busPIRone  5 mg Oral TID   cholecalciferol  2,000 Units Oral Daily   cyanocobalamin  5,000 mcg Oral Daily   ezetimibe  10 mg Oral Daily   ferrous sulfate  325 mg Oral Daily    FLUoxetine  20 mg Oral BID   fluticasone  2 spray Each Nare Daily   furosemide  20 mg Intravenous Once   furosemide  40 mg Intravenous Q12H   influenza vaccine adjuvanted  0.5 mL Intramuscular Tomorrow-1000   insulin aspart  0-20  Units Subcutaneous TID WC   insulin aspart  0-5 Units Subcutaneous QHS   isosorbide mononitrate  15 mg Oral Daily   levalbuterol  0.63 mg Nebulization BID   levothyroxine  125 mcg Oral Q0600   loratadine  10 mg Oral Daily   [START ON 11/20/2022] methylPREDNISolone (SOLU-MEDROL) injection  60 mg Intravenous Daily   pantoprazole  40 mg Oral Q0600   potassium chloride  10 mEq Oral Daily   rosuvastatin  10 mg Oral QODAY   sorbitol  30 mL Oral Q3H   Continuous Infusions:  cefTRIAXone (ROCEPHIN)  IV 2 g (11/18/22 2145)     LOS: 2 days    Time spent: 40 minutes    Ramiro Harvest, MD [facility]   To contact the attending provider between 7A-7P or the covering provider during after hours 7P-7A, please log into the web site [email or web address] and access using universal [place] password for that web site. If you do not have the password, please call the hospital operator.  11/19/2022, 10:59 AM

## 2022-11-19 NOTE — Care Management Important Message (Signed)
Important Message  Patient Details  Name: Alexandria [name] MRN: [id number] Date of Birth: [birth year]   Medicare Important Message Given:  Yes     Johnell Comings 11/19/2022, 10:48 AM

## 2022-11-19 NOTE — Progress Notes (Signed)
Physical Therapy Treatment Patient Details Name: Alexandria [name] MRN: [id number] DOB: Jan 26, 1936 Today's Date: 11/19/2022   History of Present Illness 87 year old female history of COPD on home O2 3 L, HFpEF EF 60 to 65% 06/2022, CKD stage IIIb, diabetes, OSA not on CPAP, stage I NSCLC s/p radiation 2021, chronic iron deficiency anemia for several years with upper and lower endoscopy in 2017 showing hemorrhoids and diverticulosis without hemorrhage seen in the cancer center and sent to the ED due to anemia.  Patient presented with complaints of worsening shortness of breath than baseline as well as fatigue.    PT Comments  Patient on [facility] with NT on arrival. Agreeable to PT session. Able to come into standing from [facility] with CGA. Ambulated 40' x 2 with RW, CGA, and chair follow. Required seated rest break prior to return to room. On 3L O2 during mobility, spO2 >93% throughout despite 3/4 DOE. Patient returned to room and requested to sit back on [facility] for potential bowel movement. Daughter on speaker phone throughout session to provide more information. Patient reports being comfortable on 3L O2 at home, however daughter counters and states that she is supposed to be on 2L and is not supposed to be on 3L at home unless exerting herself. Patient tolerated ambulation and exerting self well on 3L. Discharge plan remains appropriate. May improve enough to update discharge recommendation to [facility] if family able to provide frequent supervision for safety.    If plan is discharge home, recommend the following: A little help with walking and/or transfers;A little help with bathing/dressing/bathroom;Assistance with cooking/housework;Assist for transportation   Can travel by private vehicle     No  Equipment Recommendations  None recommended by PT    Recommendations for Other Services       Precautions / Restrictions Precautions Precautions: Fall Restrictions Weight Bearing Restrictions: No     Mobility   Bed Mobility               General bed mobility comments: on BSC with NT present    Transfers Overall transfer level: Needs assistance Equipment used: Rolling Rudy Luhmann (2 wheels) Transfers: Sit to/from Stand Sit to Stand: Contact guard assist                Ambulation/Gait Ambulation/Gait assistance: Contact guard assist Gait Distance (Feet): 40 Feet (+40') Assistive device: Rolling Asli Tokarski (2 wheels) Gait Pattern/deviations: Step-through pattern, Decreased stride length, Trunk flexed Gait velocity: decreased     General Gait Details: CGA for safety. Very slow gait and heavy reliance on Rw   Stairs             Wheelchair Mobility     Tilt Bed    Modified Rankin (Stroke Patients Only)       Balance Overall balance assessment: Needs assistance Sitting-balance support: Feet supported Sitting balance-[name]: Good     Standing balance support: Reliant on assistive device for balance, During functional activity, Bilateral upper extremity supported Standing balance-[name]: Fair                              Cognition Arousal: Alert Behavior During Therapy: WFL for tasks assessed/performed Overall Cognitive Status: Within Functional Limits for tasks assessed  Exercises      General Comments General comments (skin integrity, edema, etc.): On 3L O2 throughout session, able to maintain >93% throughout mobility      Pertinent Vitals/Pain Pain Assessment Pain Assessment: No/denies pain    Home Living                          Prior Function            PT Goals (current goals can now be found in the care plan section) Acute Rehab PT Goals Patient Stated Goal: get strong enough to go back home PT Goal Formulation: With patient/family Time For Goal Achievement: 01/21/23 Potential to Achieve Goals: Good Progress towards PT goals: Progressing toward goals     Frequency    Min 1X/week      PT Plan      Co-evaluation              AM-PAC PT "6 Clicks" Mobility   Outcome Measure  Help needed turning from your back to your side while in a flat bed without using bedrails?: A Little Help needed moving from lying on your back to sitting on the side of a flat bed without using bedrails?: A Little Help needed moving to and from a bed to a chair (including a wheelchair)?: A Little Help needed standing up from a chair using your arms (e.g., wheelchair or bedside chair)?: A Little Help needed to walk in hospital room?: A Little Help needed climbing 3-5 steps with a railing? : A Lot 6 Click Score: 17    End of Session Equipment Utilized During Treatment: Gait belt Activity Tolerance: Patient tolerated treatment well Patient left: Other (comment);with call bell/phone within reach (on [facility] with sister present) Nurse Communication: Mobility status PT Visit Diagnosis: Unsteadiness on feet (R26.81);Muscle weakness (generalized) (M62.81);Difficulty in walking, not elsewhere classified (R26.2)     Time: 4696-2952 PT Time Calculation (min) (ACUTE ONLY): 30 min  Charges:    $Gait Training: 8-22 mins $Therapeutic Activity: 8-22 mins PT General Charges $$ ACUTE PT VISIT: 1 Visit                     Maylon Peppers, PT, DPT Physical Therapist - [facility]  [facility]   Dreama Kuna A Amoni Scallan 11/19/2022, 1:40 PM

## 2022-11-19 NOTE — Inpatient Diabetes Management (Signed)
Inpatient Diabetes Program Recommendations  AACE/ADA: New Consensus Statement on Inpatient Glycemic Control   Target Ranges:  Prepandial:   less than 140 mg/dL      Peak postprandial:   less than 180 mg/dL (1-2 hours)      Critically ill patients:  140 - 180 mg/dL    Latest Reference Range & Units 11/19/22 03:29  Glucose 70 - 99 mg/dL 161 (H)    Latest Reference Range & Units 11/18/22 07:49 11/18/22 10:49 11/18/22 16:43 11/18/22 21:12  Glucose-Capillary 70 - 99 mg/dL 096 (H) 045 (H) 409 (H) 280 (H)   Review of Glycemic Control  Diabetes history: DM2 Outpatient Diabetes medications: Metformin 500 mg QPM Current orders for Inpatient glycemic control: Novolog 0-20 units TID with meals, Novolog 0-5 units at bedtime; Solumedrol 60 mg Q12H   Inpatient Diabetes Program Recommendations:     Insulin: If steroids are continued, please consider ordering Novolog 4 units TID with meals for meal coverage if patient eats at least 50% of meals.  Thanks, Orlando Penner, RN, MSN, CDCES Diabetes Coordinator Inpatient Diabetes Program [phone] (Team Pager from 8am to 5pm)

## 2022-11-19 NOTE — TOC Progression Note (Signed)
Transition of Care [facility]) - Progression Note    Patient Details  Name: Alexandria [name] MRN: [id number] Date of Birth: [birth year]  Transition of Care [facility]) CM/SW Contact  Darolyn Rua, [place] Phone Number: 11/19/2022, 10:25 AM  Clinical Narrative:     CSW spoke with Chrystal at [facility] who reports they have no beds available this week or next week.   CSW spoke with MD who reports potential discharge this weekend or Monday.   CSW spoke with patient's daughter [name] and informed her of above, she reports only being agreeable to 4 and 5 star facilities and requested if there are any other in the area. CSW pulled up Medicare.gov website, informed her [facility] [place] is 4 [facility] and she reports having been there in the past and the past and they would not be agreeable.   [name] reports she would like to see how patient does over the weekend and if no beds open up at [facility] she reports she understands patient will discharge home with [facility].     Expected Discharge Plan: Skilled Nursing Facility    Expected Discharge Plan and Services                                               Social Determinants of Health (SDOH) Interventions SDOH Screenings   Food Insecurity: No Food Insecurity (11/17/2022)  Housing: Low Risk  (11/17/2022)  Transportation Needs: No Transportation Needs (11/17/2022)  Utilities: Not At Risk (11/17/2022)  Alcohol Screen: Low Risk  (10/21/2022)  Depression (PHQ2-9): Low Risk  (10/21/2022)  Recent Concern: Depression (PHQ2-9) - Medium Risk (08/03/2022)  Financial Resource Strain: Low Risk  (10/21/2022)  Physical Activity: Insufficiently Active (10/21/2022)  Social Connections: Socially Isolated (10/21/2022)  Stress: No Stress Concern Present (10/21/2022)  Tobacco Use: Medium Risk (11/16/2022)  Health Literacy: Adequate Health Literacy (10/21/2022)    Readmission Risk Interventions    11/18/2022    4:47 PM  Readmission Risk Prevention Plan   Transportation Screening Complete  Medication Review (RN Care Manager) Complete  HRI or Home Care Consult Complete  SW Recovery Care/Counseling Consult Complete  Palliative Care Screening Not Applicable  Skilled Nursing Facility Complete

## 2022-11-19 NOTE — TOC Progression Note (Signed)
Transition of Care [facility]) - Progression Note    Patient Details  Name: Alexandria [name] MRN: [id number] Date of Birth: 04-Feb-1936  Transition of Care [facility]) CM/SW Contact  Darolyn Rua, [place] Phone Number: 11/19/2022, 8:15 AM  Clinical Narrative:     CSW lvm for Chrystal with [facility] at [phone] requesting update on referral sent 9/12. Pending response.   Expected Discharge Plan: Skilled Nursing Facility    Expected Discharge Plan and Services                                               Social Determinants of Health (SDOH) Interventions SDOH Screenings   Food Insecurity: No Food Insecurity (11/17/2022)  Housing: Low Risk  (11/17/2022)  Transportation Needs: No Transportation Needs (11/17/2022)  Utilities: Not At Risk (11/17/2022)  Alcohol Screen: Low Risk  (10/21/2022)  Depression (PHQ2-9): Low Risk  (10/21/2022)  Recent Concern: Depression (PHQ2-9) - Medium Risk (08/03/2022)  Financial Resource Strain: Low Risk  (10/21/2022)  Physical Activity: Insufficiently Active (10/21/2022)  Social Connections: Socially Isolated (10/21/2022)  Stress: No Stress Concern Present (10/21/2022)  Tobacco Use: Medium Risk (11/16/2022)  Health Literacy: Adequate Health Literacy (10/21/2022)    Readmission Risk Interventions    11/18/2022    4:47 PM  Readmission Risk Prevention Plan  Transportation Screening Complete  Medication Review (RN Care Manager) Complete  HRI or Home Care Consult Complete  SW Recovery Care/Counseling Consult Complete  Palliative Care Screening Not Applicable  Skilled Nursing Facility Complete

## 2022-11-20 ENCOUNTER — Encounter: Payer: Self-pay | Admitting: Internal Medicine

## 2022-11-20 DIAGNOSIS — I480 Paroxysmal atrial fibrillation: Secondary | ICD-10-CM | POA: Diagnosis not present

## 2022-11-20 DIAGNOSIS — I5032 Chronic diastolic (congestive) heart failure: Secondary | ICD-10-CM | POA: Diagnosis not present

## 2022-11-20 DIAGNOSIS — D62 Acute posthemorrhagic anemia: Secondary | ICD-10-CM | POA: Diagnosis not present

## 2022-11-20 DIAGNOSIS — J4489 Other specified chronic obstructive pulmonary disease: Secondary | ICD-10-CM | POA: Diagnosis not present

## 2022-11-20 LAB — BPAM RBC
Blood Product Expiration Date: 202409242359
Blood Product Expiration Date: 202410072359
Blood Product Expiration Date: 202410072359
Blood Product Expiration Date: 202410182359
ISSUE DATE / TIME: 202409101838
ISSUE DATE / TIME: 202409121415
Unit Type and Rh: 9500
Unit Type and Rh: 9500
Unit Type and Rh: 9500
Unit Type and Rh: 9500

## 2022-11-20 LAB — TYPE AND SCREEN
ABO/RH(D): O NEG
Antibody Screen: POSITIVE
Donor AG Type: NEGATIVE
Donor AG Type: NEGATIVE
Unit division: 0
Unit division: 0
Unit division: 0
Unit division: 0

## 2022-11-20 LAB — BASIC METABOLIC PANEL
Anion gap: 11 (ref 5–15)
BUN: 46 mg/dL — ABNORMAL HIGH (ref 8–23)
CO2: 38 mmol/L — ABNORMAL HIGH (ref 22–32)
Calcium: 8.3 mg/dL — ABNORMAL LOW (ref 8.9–10.3)
Chloride: 94 mmol/L — ABNORMAL LOW (ref 98–111)
Creatinine, Ser: 1.24 mg/dL — ABNORMAL HIGH (ref 0.44–1.00)
GFR, Estimated: 43 mL/min — ABNORMAL LOW (ref >60)
Glucose, Bld: 135 mg/dL — ABNORMAL HIGH (ref 70–99)
Potassium: 4.2 mmol/L (ref 3.5–5.1)
Sodium: 143 mmol/L (ref 135–145)

## 2022-11-20 LAB — CBC
HCT: 31.9 % — ABNORMAL LOW (ref 36.0–46.0)
Hemoglobin: 9.8 g/dL — ABNORMAL LOW (ref 12.0–15.0)
MCH: 31 pg (ref 26.0–34.0)
MCHC: 30.7 g/dL (ref 30.0–36.0)
MCV: 100.9 fL — ABNORMAL HIGH (ref 80.0–100.0)
Platelets: 190 10*3/uL (ref 150–400)
RBC: 3.16 MIL/uL — ABNORMAL LOW (ref 3.87–5.11)
RDW: 16.1 % — ABNORMAL HIGH (ref 11.5–15.5)
WBC: 7.6 10*3/uL (ref 4.0–10.5)
nRBC: 0.3 % — ABNORMAL HIGH (ref 0.0–0.2)

## 2022-11-20 LAB — GLUCOSE, CAPILLARY
Glucose-Capillary: 241 mg/dL — ABNORMAL HIGH (ref 70–99)
Glucose-Capillary: 241 mg/dL — ABNORMAL HIGH (ref 70–99)
Glucose-Capillary: 115 mg/dL — ABNORMAL HIGH (ref 70–99)
Glucose-Capillary: 341 mg/dL — ABNORMAL HIGH (ref 70–99)

## 2022-11-20 MED ORDER — CEFDINIR 300 MG PO CAPS
300.0000 mg | ORAL_CAPSULE | Freq: Two times a day (BID) | ORAL | Status: AC
  Administered 2022-11-20 – 2022-11-21 (×4): 300 mg via ORAL
  Filled 2022-11-20 (×5): qty 1

## 2022-11-20 MED ORDER — PREDNISONE 20 MG PO TABS
60.0000 mg | ORAL_TABLET | Freq: Every day | ORAL | Status: DC
  Administered 2022-11-21: 60 mg via ORAL
  Filled 2022-11-20: qty 3

## 2022-11-20 NOTE — Progress Notes (Signed)
PROGRESS NOTE    Alexandria [name]  MVH:[id number] DOB: 07-Mar-1936 DOA: 11/16/2022 PCP: Joaquim Nam, MD    Chief Complaint  Patient presents with   Anemia    Brief Narrative:  Patient is a 87 year old female history of COPD on home O2 3 L, HFpEF EF 60 to 65% 06/2022, CKD stage IIIb, diabetes, OSA not on CPAP, stage I NSCLC s/p radiation 2021, chronic iron deficiency anemia for several years with upper and lower endoscopy in 2017 showing hemorrhoids and diverticulosis without hemorrhage seen in the cancer center and sent to the ED due to anemia.  Patient presented with complaints of worsening shortness of breath than baseline as well as fatigue.  Hemoglobin noted at PCPs office and 7.4 down from a baseline of about 10 just 3 weeks prior to admission.  Patient did endorse some dark stools but not black in color.  Patient noted per records to have been seen by Ewa Gentry, GI on 08/2022, capsule endoscopy recommended however daughter declined due to risk of procedure if positive.  It is noted in 2023 the patient not considered for further endoscopic evaluation as he was 40 pose more risk than benefit.  Plan from GI was to continue monitoring with iron infusion and transfusion as needed.  CT abdomen and pelvis done on admission with extensive colonic diverticulosis.  Patient transfused a unit of PRBCs in the ED however continued to be dyspneic and patient admitted for further evaluation.   Assessment & Plan:   Principal Problem:   Acute on chronic blood loss anemia Active Problems:   DOE (dyspnea on exertion)   COPD with chronic bronchitis and emphysema (HCC)   Chronic respiratory failure with hypoxia (HCC)   OSA on CPAP   Malignant neoplasm of right upper lobe of lung (HCC)   Essential hypertension   Chronic diastolic (congestive) heart failure (HCC)   CAD (coronary artery disease)   Paroxysmal atrial fibrillation (HCC)   PAD (peripheral artery disease) (HCC)   Type 2 diabetes mellitus with  diabetic neuropathy, without long-term current use of insulin (HCC)   Stage 3b chronic kidney disease (HCC)   Hypothyroidism   Obesity, Class III, BMI 40-49.9 (morbid obesity) (HCC)   Depression, recurrent (HCC)   Symptomatic anemia   Acute on chronic diastolic CHF (congestive heart failure) (HCC)   COPD with acute exacerbation (HCC)  #1 acute on chronic blood loss anemia/chronic iron deficiency anemia -??  Etiology. -Patient denies any significant overt bleeding. -Patient noted to have had an EGD/colonoscopy 2017 without any etiology. -Patient seen by GI 08/2022 capsule endoscopy offered but patient and family declined.  Declined upper and lower endoscopy 2023. -Status post transfusion 2 unit PRBCs during this hospitalization with hemoglobin currently at 9.8 from 9.3 from 9.6 from 7.9 from 8.1 from 7.2 on admission. -Status post IV Venofer. -Follow H&H. -Transfusion threshold hemoglobin < 8.  2.  Dyspnea on exertion/?  Symptomatic anemia/chronic respiratory failure on home O2 3 L/probable acute COPD exacerbation/OSA intolerant of CPAP/stage I NSCLC s/p radiation 2021/??  Acute on chronic diastolic CHF -Patient's dyspnea on exertion likely multifactorial secondary to symptomatic anemia in the setting of probable acute COPD exacerbation and concern for volume overload. -Per daughter patient with a 11 pound weight gain over the past 10 to 11 days. -BP noted to be borderline on admission. -BNP at 155.4. -Status post transfusion 2 unit PRBCs however per patient no significant change in dyspnea on exertion. -Patient with some wheezing noted on examination as such patient  started treatment for acute COPD exacerbation.   -Hemoglobin at 9.8 this morning after transfusion of a total of 2 units PRBCs during this hospitalization.  -Currently on Solu-Medrol 60 mg IV daily and will transition to prednisone 40 mg daily tomorrow.  -Continue scheduled Xopenex nebs, Pulmicort, Brovana, PPI, Flonase.    -Continue Lasix to 40 mg IV every 12 hours.   -Strict I's and O's, daily weights.   3.  Hyperglycemia/diabetes mellitus type 2 -Likely secondary to steroids. -Patient noted to have a hemoglobin A1c 5.3 (03/16/2022) -Repeat hemoglobin A1c of 5.2. -CBG 241 this morning.   -CBGs should improve with steroid taper. -SSI.  4. Acute on chronic diastolic CHF -Per daughter patient with a 11 pound weight gain over the past 10 to 11 days with baseline weight approximately in the 220s. -Patient with recent 2D echo 06/30/2022. -Weight on presentation was 233 pounds.  -BNP of 155.4. -Current weight of 232.7 pounds.   -Done accuracy of daily weights. -Urine output not recorded over the past 24 hours. -Improving clinically however still volume overloaded on examination. -Continue decreased dose of Imdur 15 mg daily to allow for better diuresis. -Continue Lasix 40 mg IV every 12 hours. -Continue bisoprolol however if heart rate remains bradycardic will discontinue bisoprolol. -Strict I's and O's, daily weights.  5.  Hypertension -Bisoprolol, Lasix, Imdur.  6.  Paroxysmal A-fib/sinus bradycardia -Patient noted to have new onset A-fib in 2024 that converted with IV amiodarone. -Not a anticoagulation candidate in the setting of anemia and high fall risk. -Continue home regimen amiodarone.  Heart rate ranging from the 50s to the 70s on low-dose bisoprolol.  -Monitor heart rate on bisoprolol.  7.  PAD -Continue Zetia, statin.   8.  CKD stage IIIb -Stable.  9.  Depression -Prozac.   -Continue BuSpar 5 mg 3 times daily.   10.  Hypothyroidism Continue Synthroid.   11.  Morbid obesity -Lifestyle modification -Outpatient follow-up with PCP.  12.  Constipation -Patient noted to have bowel movements yesterday.   -Continue current bowel regimen of MiraLAX daily, Senokot-S nightly.     DVT prophylaxis: SCDs Code Status: Full Family Communication: Updated patient.  Updated sister at bedside.   Updated daughter, [name] on speaker phone.  Disposition: TBD  Status is: Inpatient The patient will require care spanning > 2 midnights and should be moved to inpatient because: Severity of illness   Consultants:  None  Procedures:  CT abdomen and pelvis 11/16/2022 Chest x-ray 11/16/2022 Transfusion 1 unit PRBCs 11/16/2022 Transfusion 1 unit PRBC 11/18/2022  Antimicrobials:  Anti-infectives (From admission, onward)    Start     Dose/Rate Route Frequency Ordered Stop   11/20/22 1015  cefdinir (OMNICEF) capsule 300 mg        300 mg Oral Every 12 hours 11/20/22 0924 11/22/22 0959   11/17/22 2100  cefTRIAXone (ROCEPHIN) 2 g in sodium chloride 0.9 % 100 mL IVPB  Status:  Discontinued        2 g 200 mL/hr over 30 Minutes Intravenous Every 24 hours 11/17/22 1949 11/20/22 0924         Subjective: Patient sitting up on bedside commode.  States shortness of breath has improved over the past 24 hours.  States has been having bowel movements.  States has had good urinary output.  Sister at bedside.    Objective: Vitals:   11/20/22 0400 11/20/22 0500 11/20/22 0707 11/20/22 0824  BP: (!) 160/50   (!) 137/54  Pulse: (!) 58   Marland Kitchen)  59  Resp: 18   18  Temp: 97.7 F (36.5 C)   97.7 F (36.5 C)  TempSrc: Oral   Oral  SpO2: 99%  98% 98%  Weight:  105.6 kg    Height:        Intake/Output Summary (Last 24 hours) at 11/20/2022 1143 Last data filed at 11/20/2022 1031 Gross per 24 hour  Intake 480 ml  Output 350 ml  Net 130 ml   Filed Weights   11/18/22 0405 11/19/22 0353 11/20/22 0500  Weight: 107 kg 105.2 kg 105.6 kg    Examination:  General exam: NAD. Respiratory system: Bibasilar crackles.  Minimal expiratory wheezing.  No rhonchi.  Fair air movement.  Speaking in full sentences.  No use of accessory muscles of respiration.  Cardiovascular system: RRR with 3/6 SEM right upper sternal border.  Trace bilateral lower extremity edema.  Gastrointestinal system: Abdomen is soft,  nontender, nondistended, positive bowel sounds.  No rebound.  No guarding.  Central nervous system: Alert and oriented. No focal neurological deficits. Extremities: Symmetric 5 x 5 power. Skin: No rashes, lesions or ulcers Psychiatry: Judgement and insight appear normal. Mood & affect appropriate.     Data Reviewed: I have personally reviewed following labs and imaging studies  CBC: Recent Labs  Lab 11/16/22 1401 11/16/22 1524 11/17/22 0043 11/17/22 0623 11/18/22 0448 11/18/22 1923 11/19/22 0329 11/20/22 0452  WBC 6.3 6.9  --   --  7.1 12.3* 9.2 7.6  NEUTROABS 4.8  --   --   --   --   --   --   --   HGB 7.2* 7.4*   < > 8.1* 7.9* 9.6* 9.3* 9.8*  HCT 24.7* 24.7*  --   --  25.4* 30.8* 30.0* 31.9*  MCV 106.9* 106.9*  --   --  100.8* 101.3* 101.4* 100.9*  PLT 197 198  --   --  179 209 195 190   < > = values in this interval not displayed.    Basic Metabolic Panel: Recent Labs  Lab 11/16/22 1524 11/18/22 0448 11/19/22 0329 11/20/22 0452  NA 141 141 141 143  K 4.6 4.1 3.8 4.2  CL 97* 95* 93* 94*  CO2 37* 34* 34* 38*  GLUCOSE 200* 203* 278* 135*  BUN 53* 32* 34* 46*  CREATININE 1.43* 1.28* 1.32* 1.24*  CALCIUM 8.4* 8.5* 8.3* 8.3*  MG  --  2.7* 2.4  --     GFR: Estimated Creatinine Clearance: 40 mL/min (A) (by C-G formula based on SCr of 1.24 mg/dL (H)).  Liver Function Tests: No results for input(s): "AST", "ALT", "ALKPHOS", "BILITOT", "PROT", "ALBUMIN" in the last 168 hours.  CBG: Recent Labs  Lab 11/19/22 0858 11/19/22 1207 11/19/22 1655 11/19/22 2127 11/20/22 0955  GLUCAP 166* 283* 135* 206* 241*     No results found for this or any previous visit (from the past 240 hour(s)).       Radiology Studies: No results found.      Scheduled Meds:  amiodarone  200 mg Oral Daily   arformoterol  15 mcg Nebulization QHS   bisoprolol  2.5 mg Oral Daily   budesonide (PULMICORT) nebulizer solution  0.5 mg Nebulization BID   busPIRone  5 mg Oral TID    cefdinir  300 mg Oral Q12H   cholecalciferol  2,000 Units Oral Daily   cyanocobalamin  5,000 mcg Oral Daily   ezetimibe  10 mg Oral Daily   ferrous sulfate  325 mg Oral Daily  FLUoxetine  20 mg Oral BID   fluticasone  2 spray Each Nare Daily   furosemide  40 mg Intravenous Q12H   influenza vaccine adjuvanted  0.5 mL Intramuscular Tomorrow-1000   insulin aspart  0-20 Units Subcutaneous TID WC   insulin aspart  0-5 Units Subcutaneous QHS   isosorbide mononitrate  15 mg Oral Daily   levalbuterol  0.63 mg Nebulization BID   levothyroxine  125 mcg Oral Q0600   loratadine  10 mg Oral Daily   pantoprazole  40 mg Oral Q0600   polyethylene glycol  17 g Oral Daily   potassium chloride  10 mEq Oral Daily   [START ON 11/21/2022] predniSONE  60 mg Oral QAC breakfast   rosuvastatin  10 mg Oral QODAY   senna-docusate  1 tablet Oral QHS   Continuous Infusions:     LOS: 3 days    Time spent: 40 minutes    Ramiro Harvest, MD [facility]   To contact the attending provider between 7A-7P or the covering provider during after hours 7P-7A, please log into the web site [email or web address] and access using universal [place] password for that web site. If you do not have the password, please call the hospital operator.  11/20/2022, 11:43 AM

## 2022-11-20 NOTE — Progress Notes (Signed)
Physical Therapy Treatment Patient Details Name: Alexandria [name] MRN: [id number] DOB: Feb 09, 1936 Today's Date: 11/20/2022   History of Present Illness 87 year old female history of COPD on home O2 3 L, HFpEF EF 60 to 65% 06/2022, CKD stage IIIb, diabetes, OSA not on CPAP, stage I NSCLC s/p radiation 2021, chronic iron deficiency anemia for several years with upper and lower endoscopy in 2017 showing hemorrhoids and diverticulosis without hemorrhage seen in the cancer center and sent to the ED due to anemia.  Patient presented with complaints of worsening shortness of breath than baseline as well as fatigue.    PT Comments  Pt was sitting EOB upon arrival. She was on 3 L o2 throughout session. She did desaturate to 87% on 3L but once she took seated rest, quickly recovered to 92%. Pt does present with SOB." I always get this winded." Pt only required CGA for safety. No physical lifting assistance required. Chartered loss adjuster called daughter per pt request to discuss post acute DC. Currently, daughter still feels that STR is best option but will be coming to see her personally tomorrow. PT will continue to follow and progress per current POC.     If plan is discharge home, recommend the following: A little help with walking and/or transfers;A little help with bathing/dressing/bathroom;Assistance with cooking/housework;Assist for transportation     Equipment Recommendations  None recommended by PT       Precautions / Restrictions Precautions Precautions: Fall Restrictions Weight Bearing Restrictions: No     Mobility  Bed Mobility    General bed mobility comments: pt was sitting EOB upon arrival an then on [facility] post session    Transfers Overall transfer level: Needs assistance Equipment used: Rolling walker (2 wheels) Transfers: Sit to/from Stand Sit to Stand: Supervision  General transfer comment: no physical assistance required to stand or sit.    Ambulation/Gait Ambulation/Gait assistance:  Contact guard assist Gait Distance (Feet): 100 Feet Assistive device: Rolling walker (2 wheels) Gait Pattern/deviations: Step-through pattern, Decreased stride length, Trunk flexed Gait velocity: decreased  General Gait Details: Pt ambulated ~ 100 ft with RW with CGA for safety only. She wa son 3 L o2 with sao2 dropping to 87%. Left on 3 L but once seated , pt bounces back to 92% quickly.     Balance Overall balance assessment: Needs assistance Sitting-balance support: Feet supported Sitting balance-[name]: Good     Standing balance support: Reliant on assistive device for balance, During functional activity, Bilateral upper extremity supported Standing balance-[name]: Fair     Cognition Arousal: Alert Behavior During Therapy: WFL for tasks assessed/performed Overall Cognitive Status: Within Functional Limits for tasks assessed  General Comments: Pt is A and O x 4               Pertinent Vitals/Pain Pain Assessment Pain Assessment: No/denies pain     PT Goals (current goals can now be found in the care plan section) Acute Rehab PT Goals Patient Stated Goal: get strong enough to go back home Progress towards PT goals: Progressing toward goals    Frequency    Min 1X/week       AM-PAC PT "6 Clicks" Mobility   Outcome Measure  Help needed turning from your back to your side while in a flat bed without using bedrails?: A Little Help needed moving from lying on your back to sitting on the side of a flat bed without using bedrails?: A Little Help needed moving to and from a bed to a  chair (including a wheelchair)?: A Little Help needed standing up from a chair using your arms (e.g., wheelchair or bedside chair)?: A Little Help needed to walk in hospital room?: A Little Help needed climbing 3-5 steps with a railing? : A Little 6 Click Score: 18    End of Session   Activity Tolerance: Patient tolerated treatment well Patient left: Other (comment) (pt was  sitting on BSC upon arrival.) Nurse Communication: Mobility status PT Visit Diagnosis: Unsteadiness on feet (R26.81);Muscle weakness (generalized) (M62.81);Difficulty in walking, not elsewhere classified (R26.2)     Time: 1445-1501 PT Time Calculation (min) (ACUTE ONLY): 16 min  Charges:    $Gait Training: 8-22 mins PT General Charges $$ ACUTE PT VISIT: 1 Visit                    Jetta Lout PTA 11/20/22, 3:52 PM

## 2022-11-20 NOTE — Plan of Care (Signed)

## 2022-11-21 DIAGNOSIS — D62 Acute posthemorrhagic anemia: Secondary | ICD-10-CM | POA: Diagnosis not present

## 2022-11-21 DIAGNOSIS — J4489 Other specified chronic obstructive pulmonary disease: Secondary | ICD-10-CM | POA: Diagnosis not present

## 2022-11-21 DIAGNOSIS — R410 Disorientation, unspecified: Secondary | ICD-10-CM | POA: Insufficient documentation

## 2022-11-21 DIAGNOSIS — I480 Paroxysmal atrial fibrillation: Secondary | ICD-10-CM | POA: Diagnosis not present

## 2022-11-21 DIAGNOSIS — K5900 Constipation, unspecified: Secondary | ICD-10-CM

## 2022-11-21 DIAGNOSIS — I5032 Chronic diastolic (congestive) heart failure: Secondary | ICD-10-CM | POA: Diagnosis not present

## 2022-11-21 LAB — BASIC METABOLIC PANEL
Anion gap: 13 (ref 5–15)
BUN: 50 mg/dL — ABNORMAL HIGH (ref 8–23)
CO2: 35 mmol/L — ABNORMAL HIGH (ref 22–32)
Calcium: 8.1 mg/dL — ABNORMAL LOW (ref 8.9–10.3)
Chloride: 94 mmol/L — ABNORMAL LOW (ref 98–111)
Creatinine, Ser: 1.2 mg/dL — ABNORMAL HIGH (ref 0.44–1.00)
GFR, Estimated: 44 mL/min — ABNORMAL LOW (ref >60)
Glucose, Bld: 174 mg/dL — ABNORMAL HIGH (ref 70–99)
Potassium: 3.5 mmol/L (ref 3.5–5.1)
Sodium: 142 mmol/L (ref 135–145)

## 2022-11-21 LAB — GLUCOSE, CAPILLARY
Glucose-Capillary: 192 mg/dL — ABNORMAL HIGH (ref 70–99)
Glucose-Capillary: 137 mg/dL — ABNORMAL HIGH (ref 70–99)
Glucose-Capillary: 184 mg/dL — ABNORMAL HIGH (ref 70–99)
Glucose-Capillary: 299 mg/dL — ABNORMAL HIGH (ref 70–99)

## 2022-11-21 LAB — CBC
HCT: 30.8 % — ABNORMAL LOW (ref 36.0–46.0)
Hemoglobin: 9.6 g/dL — ABNORMAL LOW (ref 12.0–15.0)
MCH: 31.4 pg (ref 26.0–34.0)
MCHC: 31.2 g/dL (ref 30.0–36.0)
MCV: 100.7 fL — ABNORMAL HIGH (ref 80.0–100.0)
Platelets: 189 10*3/uL (ref 150–400)
RBC: 3.06 MIL/uL — ABNORMAL LOW (ref 3.87–5.11)
RDW: 15.5 % (ref 11.5–15.5)
WBC: 6 10*3/uL (ref 4.0–10.5)
nRBC: 0 % (ref 0.0–0.2)

## 2022-11-21 MED ORDER — PREDNISONE 20 MG PO TABS
40.0000 mg | ORAL_TABLET | Freq: Every day | ORAL | Status: DC
  Administered 2022-11-22: 40 mg via ORAL
  Filled 2022-11-21: qty 2

## 2022-11-21 MED ORDER — POTASSIUM CHLORIDE CRYS ER 10 MEQ PO TBCR: 10.0000 meq | EXTENDED_RELEASE_TABLET | Freq: Every day | ORAL | Status: DC

## 2022-11-21 MED ORDER — POTASSIUM CHLORIDE CRYS ER 20 MEQ PO TBCR
40.0000 meq | EXTENDED_RELEASE_TABLET | Freq: Once | ORAL | Status: AC
  Administered 2022-11-21: 40 meq via ORAL
  Filled 2022-11-21: qty 2

## 2022-11-21 MED ORDER — TORSEMIDE 20 MG PO TABS
40.0000 mg | ORAL_TABLET | Freq: Every day | ORAL | Status: DC
  Administered 2022-11-22: 40 mg via ORAL
  Filled 2022-11-21: qty 2

## 2022-11-21 NOTE — Progress Notes (Addendum)
Physical Therapy Treatment Patient Details Name: Alexandria [name] MRN: [id number] DOB: 1935-09-19 Today's Date: 11/21/2022   History of Present Illness 87 year old female history of COPD on home O2 3 L, HFpEF EF 60 to 65% 06/2022, CKD stage IIIb, diabetes, OSA not on CPAP, stage I NSCLC s/p radiation 2021, chronic iron deficiency anemia for several years with upper and lower endoscopy in 2017 showing hemorrhoids and diverticulosis without hemorrhage seen in the cancer center and sent to the ED due to anemia.  Patient presented with complaints of worsening shortness of breath than baseline as well as fatigue.    PT Comments  Pt sitting EOB upon arrival.  When asked if she was ready to walk she initially declined stating she has family in room pointing to all her visitors in the room and the very tall man who had his head touching the ceiling.  There are no visitors in the room.  She stated she was tired from going to the 4 buildings last night.  She had removed O2 and sats 81% on room air.  Replaced O2 and sats increased to 94% shortly.  She continued with general confusion.  Called RN who stated she does have some intermittent confusion.  She is able to walk 100' with RW and cga x 1 on 3lpm sats dropping briefly to 86% before returning to 94%.  She continues with odd statements talking about the witches putting hexes on her last night along with  the snakes she ate that had a metalic taste.  She then begins to talk to her son who she sees at bedside reaching out her hand to hold his.  Pt returned to supine with safety precautions in place.  Reached out to MD.   If plan is discharge home, recommend the following: A little help with walking and/or transfers;A little help with bathing/dressing/bathroom;Assistance with cooking/housework;Assist for transportation   Can travel by private vehicle        Equipment Recommendations  None recommended by PT    Recommendations for Other Services        Precautions / Restrictions Precautions Precautions: Fall Restrictions Weight Bearing Restrictions: No     Mobility  Bed Mobility Overal bed mobility: Modified Independent                  Transfers Overall transfer level: Needs assistance Equipment used: Rolling walker (2 wheels) Transfers: Sit to/from Stand Sit to Stand: Supervision                Ambulation/Gait Ambulation/Gait assistance: Contact guard assist Gait Distance (Feet): 100 Feet Assistive device: Rolling walker (2 wheels) Gait Pattern/deviations: Step-through pattern, Decreased stride length, Trunk flexed Gait velocity: decreased         Stairs             Wheelchair Mobility     Tilt Bed    Modified Rankin (Stroke Patients Only)       Balance Overall balance assessment: Needs assistance Sitting-balance support: Feet supported Sitting balance-[name]: Good     Standing balance support: Reliant on assistive device for balance, During functional activity, Bilateral upper extremity supported Standing balance-[name]: Fair                              Cognition Arousal: Alert Behavior During Therapy:  (very confused) Overall Cognitive Status: Impaired/Different from baseline Area of Impairment: Orientation, Safety/judgement, Awareness  General Comments: very confused talking to people in room that were not there, talking about witches and hexes        Exercises      General Comments        Pertinent Vitals/Pain Pain Assessment Pain Assessment: No/denies pain    Home Living                          Prior Function            PT Goals (current goals can now be found in the care plan section) Progress towards PT goals: Progressing toward goals    Frequency    Min 1X/week      PT Plan      Co-evaluation              AM-PAC PT "6 Clicks" Mobility   Outcome Measure   Help needed turning from your back to your side while in a flat bed without using bedrails?: A Little Help needed moving from lying on your back to sitting on the side of a flat bed without using bedrails?: None Help needed moving to and from a bed to a chair (including a wheelchair)?: A Little Help needed standing up from a chair using your arms (e.g., wheelchair or bedside chair)?: A Little Help needed to walk in hospital room?: A Little Help needed climbing 3-5 steps with a railing? : A Little 6 Click Score: 19    End of Session   Activity Tolerance: Patient tolerated treatment well Patient left: Other (comment) (pt was sitting on BSC upon arrival.) Nurse Communication: Mobility status PT Visit Diagnosis: Unsteadiness on feet (R26.81);Muscle weakness (generalized) (M62.81);Difficulty in walking, not elsewhere classified (R26.2)     Time: 4098-1191 PT Time Calculation (min) (ACUTE ONLY): 23 min  Charges:    $Gait Training: 23-37 mins PT General Charges $$ ACUTE PT VISIT: 1 Visit                   Danielle Dess, PTA 11/21/22, 10:17 AM

## 2022-11-21 NOTE — Plan of Care (Signed)

## 2022-11-21 NOTE — Progress Notes (Signed)
PROGRESS NOTE    Alexandria [name]  MVH:[id number] DOB: 30-Apr-1935 DOA: 11/16/2022 PCP: Joaquim Nam, MD    Chief Complaint  Patient presents with   Anemia    Brief Narrative:  Patient is a 87 year old female history of COPD on home O2 3 L, HFpEF EF 60 to 65% 06/2022, CKD stage IIIb, diabetes, OSA not on CPAP, stage I NSCLC s/p radiation 2021, chronic iron deficiency anemia for several years with upper and lower endoscopy in 2017 showing hemorrhoids and diverticulosis without hemorrhage seen in the cancer center and sent to the ED due to anemia.  Patient presented with complaints of worsening shortness of breath than baseline as well as fatigue.  Hemoglobin noted at PCPs office and 7.4 down from a baseline of about 10 just 3 weeks prior to admission.  Patient did endorse some dark stools but not black in color.  Patient noted per records to have been seen by [facility] on 08/2022, capsule endoscopy recommended however daughter declined due to risk of procedure if positive.  It is noted in 2023 the patient not considered for further endoscopic evaluation as he was 40 pose more risk than benefit.  Plan from GI was to continue monitoring with iron infusion and transfusion as needed.  CT abdomen and pelvis done on admission with extensive colonic diverticulosis.  Patient transfused a unit of PRBCs in the ED however continued to be dyspneic and patient admitted for further evaluation.   Assessment & Plan:   Principal Problem:   Acute on chronic blood loss anemia Active Problems:   DOE (dyspnea on exertion)   COPD with chronic bronchitis and emphysema (HCC)   Chronic respiratory failure with hypoxia (HCC)   OSA on CPAP   Malignant neoplasm of right upper lobe of lung (HCC)   Essential hypertension   Chronic diastolic (congestive) heart failure (HCC)   CAD (coronary artery disease)   Paroxysmal atrial fibrillation (HCC)   PAD (peripheral artery disease) (HCC)   Type 2 diabetes mellitus with  diabetic neuropathy, without long-term current use of insulin (HCC)   Stage 3b chronic kidney disease (HCC)   Hypothyroidism   Obesity, Class III, BMI 40-49.9 (morbid obesity) (HCC)   Depression, recurrent (HCC)   Symptomatic anemia   Acute on chronic diastolic CHF (congestive heart failure) (HCC)   COPD with acute exacerbation (HCC)   Confusion  #1 acute on chronic blood loss anemia/chronic iron deficiency anemia -??  Etiology. -Patient denies any significant overt bleeding. -Patient noted to have had an EGD/colonoscopy 2017 without any etiology. -Patient seen by GI 08/2022 capsule endoscopy offered but patient and family declined.  Declined upper and lower endoscopy 2023. -Status post transfusion 2 unit PRBCs during this hospitalization with hemoglobin currently at 9.6 from 9.8 from 9.3 from 9.6 from 7.9 from 8.1 from 7.2 on admission. -Status post IV Venofer. -Follow H&H. -Transfusion threshold hemoglobin < 8.  2.  Dyspnea on exertion/?  Symptomatic anemia/chronic respiratory failure on home O2 3 L/probable acute COPD exacerbation/OSA intolerant of CPAP/stage I NSCLC s/p radiation 2021/??  Acute on chronic diastolic CHF -Patient's dyspnea on exertion likely multifactorial secondary to symptomatic anemia in the setting of probable acute COPD exacerbation and concern for volume overload. -Per daughter patient with a 11 pound weight gain over the past 10 to 11 days. -BP noted to be borderline on admission. -BNP at 155.4. -Status post transfusion 2 unit PRBCs however per patient no significant change in dyspnea on exertion. -Patient with some wheezing noted  on examination as such patient started treatment for acute COPD exacerbation.   -Hemoglobin at 9.6 this morning after transfusion of a total of 2 units PRBCs during this hospitalization.  -Was on IV Solu-Medrol and transition to prednisone 60 mg daily today.   -Decrease prednisone further to 40 mg daily with rapid taper as patient noted  to have some visual hallucinations and confused. -Continue scheduled Xopenex nebs, Pulmicort, Brovana, PPI, Flonase.   -Continue IV Lasix and transition to home dose oral torsemide tomorrow.    -Strict I's and O's, daily weights.   3.  Hyperglycemia/diabetes mellitus type 2 -Likely secondary to steroids. -Patient noted to have a hemoglobin A1c 5.3 (03/16/2022) -Repeat hemoglobin A1c of 5.2. -CBG 192 this morning. -Monitor CBG with steroid taper.  -SSI.  4. Acute on chronic diastolic CHF -Per daughter patient with a 11 pound weight gain over the past 10 to 11 days with baseline weight approximately in the 220s. -Patient with recent 2D echo 06/30/2022. -Weight on presentation was 233 pounds.  -BNP of 155.4. -Weight of 232.7 pounds(11/20/2022) -Done accuracy of daily weights. -Urine output of 1 L recorded over the past 24 hours.   -Improving clinically.   -Continue decreased dose of Imdur 15 mg daily to allow for better diuresis.  -Will transition from IV Lasix back to home dose of oral torsemide 40 mg daily tomorrow.  -Continue decreased dose of Imdur 15 mg daily to allow for better diuresis. -Continue bisoprolol however if heart rate remains bradycardic will discontinue bisoprolol. -Strict I's and O's, daily weights.  5.  Hypertension -Continue Lasix, Imdur, bisoprolol.    6.  Paroxysmal A-fib/sinus bradycardia -Patient noted to have new onset A-fib in 2024 that converted with IV amiodarone. -Not a anticoagulation candidate in the setting of anemia and high fall risk. -Continue home regimen amiodarone.  Heart rate ranging from the 50s to the 70s on low-dose bisoprolol.  -Monitor heart rate on bisoprolol.  7.  PAD -Zetia, statin.    8.  CKD stage IIIb -Stable.  9.  Depression -Continue Prozac.   -Continue BuSpar 5 mg 3 times daily.   10.  Hypothyroidism Synthroid.   11.  Morbid obesity -Lifestyle modification -Outpatient follow-up with PCP.  12.   Constipation -Improved.   -Continue current bowel regimen of MiraLAX daily, Senokot-S nightly.  13.  Confusion/altered mental status -Patient with bouts of waxing and waning confusion, per staff and PT patient with some bouts of visual hallucinations. -Patient currently afebrile, patient empirically on antibiotics not a fan infectious etiology. -Could likely be secondary to steroids. -Decrease prednisone to 40 mg daily with rapid steroid taper. -Supportive care.   DVT prophylaxis: SCDs Code Status: Full Family Communication: Updated patient.  No family at bedside. Disposition: Likely SNF versus home with home health when medically stable with clinical improvement.    Status is: Inpatient The patient will require care spanning > 2 midnights and should be moved to inpatient because: Severity of illness   Consultants:  None  Procedures:  CT abdomen and pelvis 11/16/2022 Chest x-ray 11/16/2022 Transfusion 1 unit PRBCs 11/16/2022 Transfusion 1 unit PRBC 11/18/2022  Antimicrobials:  Anti-infectives (From admission, onward)    Start     Dose/Rate Route Frequency Ordered Stop   11/20/22 1015  cefdinir (OMNICEF) capsule 300 mg        300 mg Oral Every 12 hours 11/20/22 0924 11/22/22 0959   11/17/22 2100  cefTRIAXone (ROCEPHIN) 2 g in sodium chloride 0.9 % 100 mL IVPB  Status:  Discontinued        2 g 200 mL/hr over 30 Minutes Intravenous Every 24 hours 11/17/22 1949 11/20/22 0924         Subjective: Patient noted to be confused this morning.  Per PT patient also noted to have some visual hallucinations.  Patient with periods of waxing and waning confusion. Patient denies any chest pain, states had some anxiety this morning which may be contributing to her shortness of breath.  Having good urine output.    Objective: Vitals:   11/20/22 2108 11/21/22 0344 11/21/22 0712 11/21/22 0939  BP:  (!) 129/44  (!) 118/53  Pulse:  (!) 56  (!) 59  Resp:  18  (!) 22  Temp:  97.6 F (36.4 C)   98.4 F (36.9 C)  TempSrc:  Oral    SpO2: 100% 100% 98% (!) 88%  Weight:      Height:        Intake/Output Summary (Last 24 hours) at 11/21/2022 1232 Last data filed at 11/21/2022 1042 Gross per 24 hour  Intake 1260 ml  Output --  Net 1260 ml   Filed Weights   11/18/22 0405 11/19/22 0353 11/20/22 0500  Weight: 107 kg 105.2 kg 105.6 kg    Examination:  General exam: NAD. Respiratory system: Decreased bibasilar crackles.  Minimal expiratory wheezing.  Upper airway noise.  Fair air movement.  No use of accessory muscles of respiration.  Cardiovascular system: RRR with 3/6 SEM right upper sternal border.  No significant pitting lower extremity edema.  Gastrointestinal system: Abdomen is soft, nontender, nondistended, positive bowel sounds.  No rebound.  No guarding.  Central nervous system: Alert and oriented. No focal neurological deficits. Extremities: Symmetric 5 x 5 power. Skin: No rashes, lesions or ulcers Psychiatry: Judgement and insight appear poor. Mood & affect appropriate.     Data Reviewed: I have personally reviewed following labs and imaging studies  CBC: Recent Labs  Lab 11/16/22 1401 11/16/22 1524 11/18/22 0448 11/18/22 1923 11/19/22 0329 11/20/22 0452 11/21/22 0513  WBC 6.3   < > 7.1 12.3* 9.2 7.6 6.0  NEUTROABS 4.8  --   --   --   --   --   --   HGB 7.2*   < > 7.9* 9.6* 9.3* 9.8* 9.6*  HCT 24.7*   < > 25.4* 30.8* 30.0* 31.9* 30.8*  MCV 106.9*   < > 100.8* 101.3* 101.4* 100.9* 100.7*  PLT 197   < > 179 209 195 190 189   < > = values in this interval not displayed.    Basic Metabolic Panel: Recent Labs  Lab 11/16/22 1524 11/18/22 0448 11/19/22 0329 11/20/22 0452 11/21/22 0513  NA 141 141 141 143 142  K 4.6 4.1 3.8 4.2 3.5  CL 97* 95* 93* 94* 94*  CO2 37* 34* 34* 38* 35*  GLUCOSE 200* 203* 278* 135* 174*  BUN 53* 32* 34* 46* 50*  CREATININE 1.43* 1.28* 1.32* 1.24* 1.20*  CALCIUM 8.4* 8.5* 8.3* 8.3* 8.1*  MG  --  2.7* 2.4  --   --      GFR: Estimated Creatinine Clearance: 41.3 mL/min (A) (by C-G formula based on SCr of 1.2 mg/dL (H)).  Liver Function Tests: No results for input(s): "AST", "ALT", "ALKPHOS", "BILITOT", "PROT", "ALBUMIN" in the last 168 hours.  CBG: Recent Labs  Lab 11/20/22 0955 11/20/22 1148 11/20/22 1624 11/20/22 2206 11/21/22 1011  GLUCAP 241* 241* 115* 341* 192*     No results found for  this or any previous visit (from the past 240 hour(s)).       Radiology Studies: No results found.      Scheduled Meds:  amiodarone  200 mg Oral Daily   arformoterol  15 mcg Nebulization QHS   bisoprolol  2.5 mg Oral Daily   budesonide (PULMICORT) nebulizer solution  0.5 mg Nebulization BID   busPIRone  5 mg Oral TID   cefdinir  300 mg Oral Q12H   cholecalciferol  2,000 Units Oral Daily   cyanocobalamin  5,000 mcg Oral Daily   ezetimibe  10 mg Oral Daily   ferrous sulfate  325 mg Oral Daily   FLUoxetine  20 mg Oral BID   fluticasone  2 spray Each Nare Daily   furosemide  40 mg Intravenous Q12H   influenza vaccine adjuvanted  0.5 mL Intramuscular Tomorrow-1000   insulin aspart  0-20 Units Subcutaneous TID WC   insulin aspart  0-5 Units Subcutaneous QHS   isosorbide mononitrate  15 mg Oral Daily   levalbuterol  0.63 mg Nebulization BID   levothyroxine  125 mcg Oral Q0600   loratadine  10 mg Oral Daily   pantoprazole  40 mg Oral Q0600   polyethylene glycol  17 g Oral Daily   [START ON 11/22/2022] potassium chloride  10 mEq Oral Daily   predniSONE  60 mg Oral QAC breakfast   rosuvastatin  10 mg Oral QODAY   senna-docusate  1 tablet Oral QHS   Continuous Infusions:     LOS: 4 days    Time spent: 40 minutes    Ramiro Harvest, MD [facility]   To contact the attending provider between 7A-7P or the covering provider during after hours 7P-7A, please log into the web site [email or web address] and access using universal [place] password for that web site. If you do not have  the password, please call the hospital operator.  11/21/2022, 12:32 PM

## 2022-11-22 ENCOUNTER — Ambulatory Visit: Payer: Medicare Other | Admitting: Nurse Practitioner

## 2022-11-22 DIAGNOSIS — D62 Acute posthemorrhagic anemia: Secondary | ICD-10-CM | POA: Diagnosis not present

## 2022-11-22 DIAGNOSIS — J4489 Other specified chronic obstructive pulmonary disease: Secondary | ICD-10-CM | POA: Diagnosis not present

## 2022-11-22 DIAGNOSIS — I480 Paroxysmal atrial fibrillation: Secondary | ICD-10-CM | POA: Diagnosis not present

## 2022-11-22 DIAGNOSIS — I5032 Chronic diastolic (congestive) heart failure: Secondary | ICD-10-CM | POA: Diagnosis not present

## 2022-11-22 LAB — CBC
HCT: 32.3 % — ABNORMAL LOW (ref 36.0–46.0)
Hemoglobin: 10.1 g/dL — ABNORMAL LOW (ref 12.0–15.0)
MCH: 31.6 pg (ref 26.0–34.0)
MCHC: 31.3 g/dL (ref 30.0–36.0)
MCV: 100.9 fL — ABNORMAL HIGH (ref 80.0–100.0)
Platelets: 163 10*3/uL (ref 150–400)
RBC: 3.2 MIL/uL — ABNORMAL LOW (ref 3.87–5.11)
RDW: 15.2 % (ref 11.5–15.5)
WBC: 6.1 10*3/uL (ref 4.0–10.5)
nRBC: 0 % (ref 0.0–0.2)

## 2022-11-22 LAB — URINALYSIS, COMPLETE (UACMP) WITH MICROSCOPIC
Bacteria, UA: NONE SEEN
Bilirubin Urine: NEGATIVE
Glucose, UA: NEGATIVE mg/dL
Ketones, ur: NEGATIVE mg/dL
Leukocytes,Ua: NEGATIVE
Nitrite: NEGATIVE
Protein, ur: NEGATIVE mg/dL
Specific Gravity, Urine: 1.006 (ref 1.005–1.030)
pH: 7 (ref 5.0–8.0)

## 2022-11-22 LAB — BASIC METABOLIC PANEL
Anion gap: 11 (ref 5–15)
BUN: 46 mg/dL — ABNORMAL HIGH (ref 8–23)
CO2: 35 mmol/L — ABNORMAL HIGH (ref 22–32)
Calcium: 8.6 mg/dL — ABNORMAL LOW (ref 8.9–10.3)
Chloride: 98 mmol/L (ref 98–111)
Creatinine, Ser: 1.21 mg/dL — ABNORMAL HIGH (ref 0.44–1.00)
GFR, Estimated: 44 mL/min — ABNORMAL LOW (ref >60)
Glucose, Bld: 104 mg/dL — ABNORMAL HIGH (ref 70–99)
Potassium: 3.4 mmol/L — ABNORMAL LOW (ref 3.5–5.1)
Sodium: 144 mmol/L (ref 135–145)

## 2022-11-22 LAB — GLUCOSE, CAPILLARY
Glucose-Capillary: 91 mg/dL (ref 70–99)
Glucose-Capillary: 158 mg/dL — ABNORMAL HIGH (ref 70–99)
Glucose-Capillary: 167 mg/dL — ABNORMAL HIGH (ref 70–99)
Glucose-Capillary: 187 mg/dL — ABNORMAL HIGH (ref 70–99)

## 2022-11-22 MED ORDER — BUSPIRONE HCL 10 MG PO TABS
5.0000 mg | ORAL_TABLET | Freq: Once | ORAL | Status: AC
  Administered 2022-11-22: 5 mg via ORAL
  Filled 2022-11-22: qty 1

## 2022-11-22 MED ORDER — BUSPIRONE HCL 10 MG PO TABS
15.0000 mg | ORAL_TABLET | Freq: Three times a day (TID) | ORAL | Status: DC
  Administered 2022-11-22 – 2022-11-26 (×12): 15 mg via ORAL
  Filled 2022-11-22 (×12): qty 2

## 2022-11-22 MED ORDER — BUSPIRONE HCL 10 MG PO TABS: 10.0000 mg | ORAL_TABLET | Freq: Three times a day (TID) | ORAL | Status: DC

## 2022-11-22 MED ORDER — ORAL CARE MOUTH RINSE: 15.0000 mL | OROMUCOSAL | Status: DC | PRN

## 2022-11-22 MED ORDER — POTASSIUM CHLORIDE CRYS ER 10 MEQ PO TBCR: 10.0000 meq | EXTENDED_RELEASE_TABLET | Freq: Every day | ORAL | Status: DC

## 2022-11-22 MED ORDER — POTASSIUM CHLORIDE CRYS ER 10 MEQ PO TBCR
40.0000 meq | EXTENDED_RELEASE_TABLET | Freq: Once | ORAL | Status: AC
  Administered 2022-11-22: 40 meq via ORAL
  Filled 2022-11-22: qty 4

## 2022-11-22 MED ORDER — PREDNISONE 20 MG PO TABS
20.0000 mg | ORAL_TABLET | Freq: Every day | ORAL | Status: AC
  Administered 2022-11-23 – 2022-11-25 (×3): 20 mg via ORAL
  Filled 2022-11-22 (×3): qty 1

## 2022-11-22 NOTE — Progress Notes (Deleted)
Date:  11/22/2022   ID:  Alexandria [name], DOB 25-Jul-1935, MRN [id number]  Patient Location:  [address] [facility] [place] [address]   Provider location:   Alcus Dad, [place] office  PCP:  Joaquim Nam, MD  Cardiologist:  [facility]  No chief complaint on file.    History of Present Illness:    Alexandria [name] is a 87 y.o. female with past medical history of obesity,   long history of smoking for 50 years, COPD, quit 15 years DM2, dx in 2006, non-insulin-dependent, controlled, with  mild CKD hyperlipidemia,  hypertension,   2012  chest pain, stress test at that time with no ischemia, normal EF  July 2016 with COPD exacerbation, sepsis. sleep apnea, not on CPAP, seen by Dr. Lucious Groves, mask does not fit Chronic SOB, normal echo 09/2014 Anxiety Diagnosis of lung cancer,  completed radiation , planned chemo Normal ejection fraction December 2022 Echo January 2024 EF 55 to 60%, mild AS She presents for follow-up of her shortness of breath symptoms  Last seen in clinic by myself March 2024 Seen by one of our providers Aug 06, 2022   admitted to hospital April 2024 in the setting of fevers, chills, and lethargy.     UA showed UTI.   developed Afib w/ RVR in the 140's after removing her BiPAP at night and becoming hypoxic.  She was initially placed on IV amiodarone however, she converted to sinus rhythm and amiodarone was discontinued in the setting of bradycardia.    Echo showed normal LV function with an EF of 60 to 65%, mild MR, mild aortic stenosis.   discharged home on amiodarone.   No oral anticoagulation in the setting of anemia with high fall risk.  ZIO monitor was placed which showed sinus rhythm and 1 brief run of SVT.  No recurrent atrial fibrillation.  2.2% PVC burden.     rehab facility following hospital discharge   Recently received a phone call concerning weight gain and worsening leg swelling At that time was taking  torsemide 20 daily Was recommended to increase torsemide up to 60 mg daily for 3 days then down to 40 mg daily since June 10th Prior weight 242 pounds, recent weight 226-228 Abdomen is soft, leg swelling improved  No significant chest tightness Continues to use oxygen No compression hose today  sedentary at baseline Has a aide that comes to the house Previously required Unna boots for leg swelling  EKG personally reviewed by myself on todays visit Nsr rate 61 beats per minute, poor R wave progression to the anterior precordial leads, ST-T wave abnormality 1 and aVL, appears relatively unchanged from prior EKG  Echocardiogram February 11, 2021 reviewed  1. Left ventricular ejection fraction, by estimation, is 55 to 60%. The  left ventricle has normal function. The left ventricle has no regional  wall motion abnormalities. Left ventricular diastolic parameters are  consistent with Grade II diastolic  dysfunction (pseudonormalization).   2. Right ventricular systolic function is normal. The right ventricular  size is normal.   3. Left atrial size was mild to moderately dilated.   4. The mitral valve is degenerative. No evidence of mitral valve  regurgitation. Moderate to severe mitral annular calcification.   5. The aortic valve was not well visualized. Aortic valve regurgitation  is not visualized.   6. The inferior vena cava is normal in size with <50% respiratory  variability, suggesting right atrial  pressure of 8 mmHg.    Past Medical History:  Diagnosis Date   Allergy, unspecified not elsewhere classified    Anxiety    Aortic stenosis, mild 03/30/2022   a. TTE 03/29/2022: EF 55-60, no RWMA, GR 1 DD, normal RVSF, mild aortic stenosis (mean 10, V-max 212 cm/s, DI 0.70)   Cataract    Chronic diastolic congestive heart failure (HCC)    a. 06/2022 Echo: EF 60-65%, no rwma, nl RV fxn, mild MR, mild AS.   COPD (chronic obstructive pulmonary disease) (HCC) 03/08/1998   PFTs  12/12/2002 FEV 1 1.42 (64%) ratio 58 with no better after B2 and DLCO75%; PFTs 11/20/09 FEV1 1.50 (73%) ratio 50 no better after B2 with DLCO 62%; Hfa 50% 11/20/2009 >75%, 01/13/10 p coaching   Coronary artery calcification seen on CT scan    a. 2012 Neg stress test.   Depression 12/07/1998   Diabetes mellitus type II 02/05/2006   Dr. Elvera Lennox with endo   Disorders of bursae and tendons in shoulder region, unspecified    Rotator cuff syndrome, right   E. coli bacteremia    Esophagitis    GERD (gastroesophageal reflux disease)    History of UTI    Hyperlipidemia 10/04/2000   Hypertension 03/08/1992   Hypothyroidism 03/08/1968   Iron deficiency anemia    Lung nodule    radiation starts 10-08-2019   Malignant neoplasm of right upper lobe of lung (HCC) 07/24/2019   Obesity    NOS   OSA (obstructive sleep apnea)    PSG 01/27/10 AHI 13, pt does not know CPAP settings   PAF (paroxysmal atrial fibrillation) (HCC)    a. 06/2022 in setting of UTI/encephalopathy-->converted on amio; b. CHA2DS2VASc = 6-->No OAC 2/2 chronic anemia/fall risk; c. 06/2022 Zio: Predominantly sinus bradycardia at 54 (3-158)'s.  1 brief run of SVT.  2.2% PVC burden.   Peripheral neuropathy    Likely due to DM per Dr. Tresa Endo hernia    Past Surgical History:  Procedure Laterality Date   ABD U/S  03/19/1999   Nml x2 foci in liver   ADENOSINE MYOVIEW  06/02/2007   Nml   CARDIOLITE PERSANTINE  08/24/2000   Nml   CAROTID U/S  08/24/2000   1-39% ICA stenosis   CAROTID U/S  06/02/2007   No apprec change    CARPAL TUNNEL RELEASE  12/1997   Right   CESAREAN SECTION     x2 Breech/ repeat   CHOLECYSTECTOMY  1997   COLONOSCOPY WITH PROPOFOL N/A 02/12/2016   Procedure: COLONOSCOPY WITH PROPOFOL;  Surgeon: Rachael Fee, MD;  Location: [facility];  Service: Endoscopy;  Laterality: N/A;   CT ABD W & PELVIS WO/W CM     Abd hemangiomas of liver, 1 cm R renal cyst   DENTAL SURGERY  2016   Implants   DEXA  07/03/2003    Nml   ESOPHAGOGASTRODUODENOSCOPY  12/05/1997   Nml (due to hoarseness)   ESOPHAGOGASTRODUODENOSCOPY (EGD) WITH PROPOFOL N/A 02/12/2016   Procedure: ESOPHAGOGASTRODUODENOSCOPY (EGD) WITH PROPOFOL;  Surgeon: Rachael Fee, MD;  Location: [facility];  Service: Endoscopy;  Laterality: N/A;   GALLBLADDER SURGERY     HERNIA REPAIR  01/24/2009   Lap Ventr w/ Lysis of adhesions (Dr. Dwain Sarna)   knee arthroscopic surgery  years ago   right   ROTATOR CUFF REPAIR  1984   Right, [place]   SHOULDER OPEN ROTATOR CUFF REPAIR  02/08/2012   Procedure: ROTATOR CUFF REPAIR SHOULDER OPEN;  Surgeon: Drucilla Schmidt, MD;  Location: [facility];  Service: Orthopedics;  Laterality: Left;  Left Shoulder Open Anterior Acrominectomy Rotator Cuff Repair Open Distal Clavicle Resection ,tissue mend graft, and repair of biceps tendon   THUMB RELEASE  12/1997   Right   TONSILLECTOMY     TOTAL ABDOMINAL HYSTERECTOMY  1985   Due to dysmennorhea   US ECHOCARDIOGRAPHY  06/02/2007     Allergies:   Antihistamines, diphenhydramine-type; Incruse ellipta [umeclidinium bromide]; Clarithromycin; Codeine; Diphenhydramine; Pregabalin; Sulfonamide derivatives; Tiotropium bromide monohydrate; Umeclidinium; and Vraylar [cariprazine]   Social History   Tobacco Use   Smoking status: Former    Current packs/day: 0.00    Average packs/day: 2.0 packs/day for 50.0 years (100.0 ttl pk-yrs)    Types: Cigarettes    Start date: 02/06/1955    Quit date: 02/05/2005    Years since quitting: 17.8   Smokeless tobacco: Never  Vaping Use   Vaping status: Never Used  Substance Use Topics   Alcohol use: Yes    Comment: occasionally   Drug use: Never     Current Facility-Administered Medications on File Prior to Visit  Medication Dose Route Frequency Provider Last Rate Last Admin   0.9 %  sodium chloride infusion   Intravenous Once Louretta Shorten R, MD       0.9 %  sodium chloride infusion   Intravenous Continuous Earna Coder, MD 20 mL/hr at 11/02/22 1421 New Bag at 11/02/22 1421   acetaminophen (TYLENOL) tablet 650 mg  650 mg Oral Q6H PRN Andris Baumann, MD       Or   acetaminophen (TYLENOL) suppository 650 mg  650 mg Rectal Q6H PRN Andris Baumann, MD       amiodarone (PACERONE) tablet 200 mg  200 mg Oral Daily Rodolph Bong, MD   200 mg at 11/22/22 0933   arformoterol (BROVANA) nebulizer solution 15 mcg  15 mcg Nebulization QHS Rodolph Bong, MD   15 mcg at 11/21/22 2040   bisoprolol (ZEBETA) tablet 2.5 mg  2.5 mg Oral Daily Lindajo Royal V, MD   2.5 mg at 11/22/22 0934   budesonide (PULMICORT) nebulizer solution 0.5 mg  0.5 mg Nebulization BID Rodolph Bong, MD   0.5 mg at 11/22/22 0734   busPIRone (BUSPAR) tablet 15 mg  15 mg Oral TID Rodolph Bong, MD       cholecalciferol (VITAMIN D3) 25 MCG (1000 UNIT) tablet 2,000 Units  2,000 Units Oral Daily Andris Baumann, MD   2,000 Units at 11/22/22 0933   cyanocobalamin (VITAMIN B12) tablet 5,000 mcg  5,000 mcg Oral Daily Andris Baumann, MD   5,000 mcg at 11/22/22 0933   ezetimibe (ZETIA) tablet 10 mg  10 mg Oral Daily Rodolph Bong, MD   10 mg at 11/22/22 0934   ferrous sulfate tablet 325 mg  325 mg Oral Daily Andris Baumann, MD   325 mg at 11/22/22 0932   FLUoxetine (PROZAC) capsule 20 mg  20 mg Oral BID Rodolph Bong, MD   20 mg at 11/22/22 0935   fluticasone (FLONASE) 50 MCG/ACT nasal spray 2 spray  2 spray Each Nare Daily Rodolph Bong, MD   2 spray at 11/22/22 0935   influenza vaccine adjuvanted (FLUAD) injection 0.5 mL  0.5 mL Intramuscular Tomorrow-1000 Rodolph Bong, MD       insulin aspart (novoLOG) injection 0-20 Units  0-20 Units Subcutaneous TID WC Andris Baumann, MD  4 Units at 11/22/22 1235   insulin aspart (novoLOG) injection 0-5 Units  0-5 Units Subcutaneous QHS Andris Baumann, MD   3 Units at 11/21/22 2212   isosorbide mononitrate (IMDUR) 24 hr tablet 15 mg  15 mg Oral Daily Rodolph Bong, MD    15 mg at 11/22/22 0931   levalbuterol (XOPENEX) nebulizer solution 0.63 mg  0.63 mg Nebulization Q2H PRN Rodolph Bong, MD       levalbuterol Pauline Aus) nebulizer solution 0.63 mg  0.63 mg Nebulization BID Rodolph Bong, MD   0.63 mg at 11/22/22 0734   levothyroxine (SYNTHROID) tablet 125 mcg  125 mcg Oral Q0600 Andris Baumann, MD   125 mcg at 11/22/22 0518   loratadine (CLARITIN) tablet 10 mg  10 mg Oral Daily Rodolph Bong, MD   10 mg at 11/22/22 0932   nitroGLYCERIN (NITROSTAT) SL tablet 0.4 mg  0.4 mg Sublingual Q5 min PRN Andris Baumann, MD       ondansetron [facility]) tablet 4 mg  4 mg Oral Q6H PRN Andris Baumann, MD       Or   ondansetron [facility]) injection 4 mg  4 mg Intravenous Q6H PRN Andris Baumann, MD       Oral care mouth rinse  15 mL Mouth Rinse PRN Rodolph Bong, MD       pantoprazole (PROTONIX) EC tablet 40 mg  40 mg Oral Q0600 Rodolph Bong, MD   40 mg at 11/22/22 0518   polyethylene glycol (MIRALAX / GLYCOLAX) packet 17 g  17 g Oral Daily Rodolph Bong, MD   17 g at 11/22/22 0936   [START ON 11/23/2022] potassium chloride (KLOR-CON M) CR tablet 10 mEq  10 mEq Oral Daily Rodolph Bong, MD       [START ON 11/23/2022] predniSONE (DELTASONE) tablet 20 mg  20 mg Oral QAC breakfast Rodolph Bong, MD       rosuvastatin (CRESTOR) tablet 10 mg  10 mg Oral Bedelia Person, MD   10 mg at 11/22/22 0933   senna-docusate (Senokot-S) tablet 1 tablet  1 tablet Oral QHS Rodolph Bong, MD   1 tablet at 11/21/22 2211   torsemide (DEMADEX) tablet 40 mg  40 mg Oral Daily Rodolph Bong, MD   40 mg at 11/22/22 0932   Current Outpatient Medications on File Prior to Visit  Medication Sig Dispense Refill   acetaminophen (TYLENOL) 325 MG tablet Take 2 tablets (650 mg total) by mouth every 6 (six) hours as needed for mild pain, headache, fever or moderate pain.     albuterol (PROAIR HFA) 108 (90 Base) MCG/ACT inhaler Inhale 2 puffs into the lungs  every 6 (six) hours as needed for wheezing or shortness of breath. INHALE 2 PUFFS INTO THE LUNGS EVERY 6 HOURS AS NEEDED FOR WHEEZING OR SHORTNESS OF BREATH 3 Inhaler 3   amiodarone (PACERONE) 200 MG tablet TAKE 1 TABLET BY MOUTH EVERY DAY 90 tablet 0   arformoterol (BROVANA) 15 MCG/2ML NEBU Take 2 mLs (15 mcg total) by nebulization at bedtime. 120 mL 6   bisoprolol (ZEBETA) 5 MG tablet Take 0.5 tablets (2.5 mg total) by mouth daily. 45 tablet 1   budesonide (PULMICORT) 0.5 MG/2ML nebulizer solution Take 2 mLs (0.5 mg total) by nebulization in the morning and at bedtime. 360 mL 3   busPIRone (BUSPAR) 15 MG tablet Take 15 mg by mouth 3 (three) times daily.  Cholecalciferol (VITAMIN D) 50 MCG (2000 UT) tablet Take 1 tablet (2,000 Units total) by mouth daily.     Coenzyme Q-10 200 MG CAPS Take 200 mg by mouth daily.     Cyanocobalamin (B-12) 5000 MCG CAPS Take 5,000 mcg by mouth daily.     esomeprazole (NEXIUM) 40 MG capsule TAKE 1 CAPSULE (40 MG TOTAL) BY MOUTH DAILY AT 12 NOON. 90 capsule 3   ezetimibe (ZETIA) 10 MG tablet TAKE 1 TABLET BY MOUTH EVERYDAY AT BEDTIME 90 tablet 2   ferrous sulfate 325 (65 FE) MG tablet Take 1 tablet (325 mg total) by mouth daily. 90 tablet 3   FLUoxetine (PROZAC) 20 MG tablet Take 20 mg by mouth 2 (two) times daily.     glucose blood (ONETOUCH VERIO) test strip USE AS INSTRUCTED TO CHECK SUGAR 1 TIME DAILY 100 strip 3   guaiFENesin (MUCINEX) 600 MG 12 hr tablet Take 2 tablets (1,200 mg total) by mouth 2 (two) times daily as needed for cough or to loosen phlegm.     isosorbide mononitrate (IMDUR) 30 MG 24 hr tablet Take 30 mg by mouth daily.     levothyroxine (SYNTHROID) 125 MCG tablet Take 1 tablet (125 mcg total) by mouth daily. 90 tablet 3   magnesium 30 MG tablet Take 1 tablet (30 mg total) by mouth 2 (two) times daily. 180 tablet 3   melatonin 5 MG TABS Take 1 tablet (5 mg total) by mouth at bedtime. (Patient not taking: Reported on 10/21/2022)  0   metFORMIN  (GLUCOPHAGE) 500 MG tablet Take 1 tablet (500 mg total) by mouth every evening. 90 tablet 3   nitroGLYCERIN (NITROSTAT) 0.4 MG SL tablet Place 1 tablet (0.4 mg total) under the tongue every 5 (five) minutes as needed for chest pain. 25 tablet 3   nystatin (MYCOSTATIN) 100000 UNIT/ML suspension Take 5 mLs (500,000 Units total) by mouth 4 (four) times daily. (Patient not taking: Reported on 10/21/2022) 120 mL 0   OneTouch Delica Lancets 33G MISC Use 1x a day 100 each 3   OXYGEN Inhale into the lungs. 2lpm     potassium chloride (KLOR-CON) 10 MEQ tablet TAKE 1 TABLET BY MOUTH EVERY DAY 90 tablet 3   rosuvastatin (CRESTOR) 10 MG tablet TAKE 1 TABLET BY MOUTH EVERY OTHER DAY 45 tablet 2   Spacer/Aero Chamber Mouthpiece MISC Use with  inhaler as needed.  J44.9 1 each 0   torsemide (DEMADEX) 20 MG tablet Take 2 tablets (40 mg total) by mouth daily. Take an additional 20 mg daily as needed for weight greater then 230 pounds 270 tablet 1   Zinc Oxide (TRIPLE PASTE) 12.8 % ointment Apply 1 Application topically as needed for irritation. Every shift. (Patient not taking: Reported on 10/21/2022)      Family Hx: The patient's family history includes Cystic fibrosis in her sister; Emphysema in her father; Heart disease in her mother; Hyperthyroidism in her brother and sister; Osteoarthritis in her brother; Thyroid disease in her mother. There is no history of Esophageal cancer, Cancer, Colon cancer, Stomach cancer, or Breast cancer.  ROS:   Please see the history of present illness.    Review of Systems  Constitutional: Negative.   HENT: Negative.    Respiratory: Negative.    Cardiovascular:  Positive for leg swelling.  Gastrointestinal: Negative.   Musculoskeletal: Negative.   Neurological: Negative.   Psychiatric/Behavioral: Negative.    All other systems reviewed and are negative.   Labs/Other Tests and Data Reviewed:  Recent Labs: 04/22/2022: Pro B Natriuretic peptide (BNP) 69.0 06/30/2022: TSH  0.899 08/09/2022: ALT 15 11/16/2022: B Natriuretic Peptide 155.4 11/19/2022: Magnesium 2.4 11/22/2022: BUN 46; Creatinine, Ser 1.21; Hemoglobin 10.1; Platelets 163; Potassium 3.4; Sodium 144   Recent Lipid Panel Lab Results  Component Value Date/Time   CHOL 170 09/14/2022 01:36 PM   TRIG 154.0 (H) 09/14/2022 01:36 PM   HDL 66.90 09/14/2022 01:36 PM   CHOLHDL 3 09/14/2022 01:36 PM   LDLCALC 72 09/14/2022 01:36 PM   LDLDIRECT 63.0 07/29/2015 01:08 PM    Wt Readings from Last 3 Encounters:  11/22/22 233 lb 4 oz (105.8 kg)  11/16/22 233 lb (105.7 kg)  10/21/22 227 lb (103 kg)     Exam:    LMP  (LMP Unknown)  Constitutional:  oriented to person, place, and time. No distress.  In a wheelchair on oxygen HENT:  Head: Grossly normal Eyes:  no discharge. No scleral icterus.  Neck: No JVD, no carotid bruits  Cardiovascular: Regular rate and rhythm, no murmurs appreciated Trace nonpitting lower extremity edema to the mid shins Pulmonary/Chest: Clear to auscultation bilaterally, no wheezes or rails Abdominal: Soft.  no distension.  no tenderness.  Musculoskeletal: Normal range of motion Neurological:  normal muscle tone. Coordination normal. No atrophy Skin: Skin warm and dry Psychiatric: normal affect, pleasant  ASSESSMENT & PLAN:    Problem List Items Addressed This Visit   None   Chronic diastolic CHF/pulmonary hypertension Normal ejection fraction on echo Diuretic sliding scale as below Weight less than 230 pounds take torsemide 40 daily Weight over 230 pounds increase torsemide up to 60 mg daily Potassium 10 mill equivalents daily, recent BMP with normal potassium  Gait instability Uses a walker, no recent falls Has completed PT  COPD Long smoking history Followed by pulmonary Not on CPAP Uses chronic oxygen  Borderline diabetes Chronic renal insufficiency, creatinine 1.33, GFR 39  Severe mitral valve annular calcification with no significant stenosis Recent  echocardiogram April 2024 no significant stenosis   Total encounter time more than 40 minutes  Greater than 50% was spent in counseling and coordination of care with the patient   Signed, Julien Nordmann, MD  [facility] [address], [place], [place] [address]

## 2022-11-22 NOTE — Progress Notes (Signed)
Occupational Therapy Treatment Patient Details Name: Alexandria [name] MRN: [id number] DOB: June 01, 1935 Today's Date: 11/22/2022   History of present illness 87 year old female history of COPD on home O2 3 L, HFpEF EF 60 to 65% 06/2022, CKD stage IIIb, diabetes, OSA not on CPAP, stage I NSCLC s/p radiation 2021, chronic iron deficiency anemia for several years with upper and lower endoscopy in 2017 showing hemorrhoids and diverticulosis without hemorrhage seen in the cancer center and sent to the ED due to anemia.  Patient presented with complaints of worsening shortness of breath than baseline as well as fatigue.   OT comments  Pt received alone in room, sitting on BSC. Still experiencing visual hallucinations - when OT inquires about pt's breathing, pt immediately states it is not her, but the "little boy in room". OT facilitated ADL management as described below. Pt unable to rise to standing from [facility] on first attempt, requires cues to redirect for safe setup/UE sequencing/not to attempt to wipe as she attempts to stand, second trial pt grabs OT instead of RW, requires multimodal cuing to redirect.  MinA required for transfers/bed mobility, slow processing noted. Pt generally unsteady in standing, and requires TOTAL assist for pericare after BM. Pt is progressing toward OT goals and continues to benefit from skilled OT services to maximize return to PLOF and minimize risk of future falls, injury, caregiver burden, and readmission. Will continue to follow POC as written. Discharge recommendation remains appropriate.        If plan is discharge home, recommend the following:  A little help with walking and/or transfers;A little help with bathing/dressing/bathroom;Assistance with cooking/housework;Assist for transportation;Help with stairs or ramp for entrance;Direct supervision/assist for financial management;Direct supervision/assist for medications management   Equipment Recommendations  None  recommended by OT    Recommendations for Other Services Other (comment)    Precautions / Restrictions Precautions Precautions: Fall Precaution Comments: having visual hallucinations 9/16 Restrictions Weight Bearing Restrictions: No       Mobility Bed Mobility Overal bed mobility: Needs Assistance Bed Mobility: Sit to Supine     Supine to sit: Min assist          Transfers Overall transfer level: Needs assistance Equipment used: Rolling walker (2 wheels) Transfers: Sit to/from Stand, Bed to chair/wheelchair/BSC Sit to Stand: Min assist     Step pivot transfers: Min assist     General transfer comment: Pt unable to rise to standing from [facility] on first attempt, requires cues to redirect for safe setup/UE sequencing/not to attempt to wipe as she attempts to stand, second attempt pt grabs OT instead of RW, requires multimodal cuing to redirect     Balance Overall balance assessment: Needs assistance Sitting-balance support: Feet supported Sitting balance-[name]: Fair     Standing balance support: Reliant on assistive device for balance, During functional activity, Bilateral upper extremity supported Standing balance-[name]: Poor Standing balance comment: LOB forward while standing without waiting for assist; requires minA to correct                           ADL either performed or assessed with clinical judgement   ADL Overall ADL's : Needs assistance/impaired                         Toilet Transfer: Minimal assistance Toilet Transfer Details (indicate cue type and reason): pt needs cues to wait for RW, and OT assist, initally attempting to stand  with feet sliding out underneath Toileting- Clothing Manipulation and Hygiene: Total assistance;Sit to/from stand Toileting - Clothing Manipulation Details (indicate cue type and reason): Pt stands, using RW for support but TOTAL for posterior hygiene after BM     Functional mobility during  ADLs: Rolling walker (2 wheels) General ADL Comments: Pt requires minA to correct improper standing technique (pt stands without AD, attempting to wipe herself at the same time). Pt denies SOB, SpO2 93% via       Cognition Arousal: Lethargic Behavior During Therapy: Agitated Overall Cognitive Status: Impaired/Different from baseline Area of Impairment: Orientation, Safety/judgement, Awareness                 Orientation Level: Situation (pt states "I'm here because I was agitated and angry", knows she is at [facility])       Safety/Judgement: Decreased awareness of safety, Decreased awareness of deficits     General Comments: told OT she saw other people in room, when OT asked if her breathing was okay, pt states "It's not me, its the little boy here, he's breathing funny and needs to see his peditrician"                   Pertinent Vitals/ Pain       Pain Assessment Pain Assessment: No/denies pain   Frequency  Min 1X/week        Progress Toward Goals  OT Goals(current goals can now be found in the care plan section)  Progress towards OT goals: Progressing toward goals  Acute Rehab OT Goals OT Goal Formulation: With patient Time For Goal Achievement: 12/02/22 Potential to Achieve Goals: Fair  Plan         AM-PAC OT "6 Clicks" Daily Activity     Outcome Measure   Help from another person eating meals?: None Help from another person taking care of personal grooming?: A Little Help from another person toileting, which includes using toliet, bedpan, or urinal?: A Little Help from another person bathing (including washing, rinsing, drying)?: A Little Help from another person to put on and taking off regular upper body clothing?: None Help from another person to put on and taking off regular lower body clothing?: A Little 6 Click Score: 20    End of Session Equipment Utilized During Treatment: Gait belt;Rolling walker (2 wheels)  OT Visit Diagnosis:  Unsteadiness on feet (R26.81);Muscle weakness (generalized) (M62.81);History of falling (Z91.81)   Activity Tolerance Patient tolerated treatment well   Patient Left in bed;with call bell/phone within reach;with bed alarm set   Nurse Communication Other (comment) (Alerted NT of BM and pt left in bed with alarm on)        Time: 1142-1206 OT Time Calculation (min): 24 min  Charges: OT General Charges $OT Visit: 1 Visit OT Treatments $Self Care/Home Management : 23-37 mins  Temprance Wyre L. Burlene Montecalvo, OTR/L  11/22/22, 12:42 PM

## 2022-11-22 NOTE — Progress Notes (Addendum)
Patient is hallucinating and talking to people who aren't there. Alexandria [name] is very jumbled and makes no sense. She is constantly jerking all over. PT was able to get her to walk to nurses station but today the patient was very unsteady and only made it to the bedside commode. she is too unsafe to walk. PT said that this is different from how she was yesterday. She also has expiratory wheezing. MD notified

## 2022-11-22 NOTE — Progress Notes (Signed)
Physical Therapy Treatment Patient Details Name: Alexandria [name] MRN: [id number] DOB: [birth year] Today's Date: 11/22/2022   History of Present Illness 87 year old female history of COPD on home O2 3 L, HFpEF EF 60 to 65% 06/2022, CKD stage IIIb, diabetes, OSA not on CPAP, stage I NSCLC s/p radiation 2021, chronic iron deficiency anemia for several years with upper and lower endoscopy in 2017 showing hemorrhoids and diverticulosis without hemorrhage seen in the cancer center and sent to the ED due to anemia.  Patient presented with complaints of worsening shortness of breath than baseline as well as fatigue.    PT Comments  Pt in bed holding call bell up to her ear trying to order her breakfast.  She initially declined stating she is eating her apples and peanut butter (which are not there).  She does agree to gait and lays in bed and needs cues to initiate sitting.  Reaches for my hand to assist.  She sits on EOB and requests bathroom.  She stands and transfers with min a x 1 to void.  She agrees to gait and stands and walks a few steps and becomes unsteady and needs assist to prevent fall.  Sits on bed to rest and while sitting generally keeps eyes closed and sways in sitting.  Attempted to obtain pulse O2 but slow read and consistently in 70's with portable finger monitor.  Returned to supine with min a x 1 and obtained dynamap where it read in high 90's.  While getting reading she begins talking to people in the room and drinking her imaginary coffee.  Discussed with RN.     If plan is discharge home, recommend the following: A little help with walking and/or transfers;A little help with bathing/dressing/bathroom;Assistance with cooking/housework;Assist for transportation   Can travel by private vehicle        Equipment Recommendations  None recommended by PT    Recommendations for Other Services       Precautions / Restrictions Precautions Precautions: Fall Restrictions Weight Bearing  Restrictions: No     Mobility  Bed Mobility   Bed Mobility: Supine to Sit, Sit to Supine     Supine to sit: Min assist Sit to supine: Min assist   General bed mobility comments: reaches for assist today    Transfers Overall transfer level: Needs assistance Equipment used: Rolling walker (2 wheels) Transfers: Sit to/from Stand Sit to Stand: Contact guard assist, Min assist                Ambulation/Gait Ambulation/Gait assistance: Min assist Gait Distance (Feet): 5 Feet Assistive device: Rolling walker (2 wheels) Gait Pattern/deviations: Step-through pattern, Decreased stride length, Trunk flexed Gait velocity: decreased     General Gait Details: LOB while walking along bed needing min a to recover   Stairs             Wheelchair Mobility     Tilt Bed    Modified Rankin (Stroke Patients Only)       Balance Overall balance assessment: Needs assistance Sitting-balance support: Feet supported Sitting balance-[name]: Fair     Standing balance support: Reliant on assistive device for balance, During functional activity, Bilateral upper extremity supported Standing balance-[name]: Poor Standing balance comment: LOB with gait today requiring assist to prevent fall                            Cognition Arousal: Lethargic Behavior During Therapy: Agitated Overall  Cognitive Status: Impaired/Different from baseline                                 General Comments: a bit more orientated today, knows she is at [facility] but eating imaginary apples, drinking coffee and talking to people in room        Exercises      General Comments        Pertinent Vitals/Pain Pain Assessment Pain Assessment: No/denies pain    Home Living                          Prior Function            PT Goals (current goals can now be found in the care plan section) Progress towards PT goals: Not progressing  toward goals - comment    Frequency    Min 1X/week      PT Plan      Co-evaluation              AM-PAC PT "6 Clicks" Mobility   Outcome Measure  Help needed turning from your back to your side while in a flat bed without using bedrails?: A Little Help needed moving from lying on your back to sitting on the side of a flat bed without using bedrails?: A Little Help needed moving to and from a bed to a chair (including a wheelchair)?: A Little Help needed standing up from a chair using your arms (e.g., wheelchair or bedside chair)?: A Little Help needed to walk in hospital room?: A Little Help needed climbing 3-5 steps with a railing? : A Lot 6 Click Score: 17    End of Session   Activity Tolerance: Patient tolerated treatment well Patient left: Other (comment) (pt was sitting on BSC upon arrival.) Nurse Communication: Mobility status PT Visit Diagnosis: Unsteadiness on feet (R26.81);Muscle weakness (generalized) (M62.81);Difficulty in walking, not elsewhere classified (R26.2)     Time: 6962-9528 PT Time Calculation (min) (ACUTE ONLY): 19 min  Charges:    $Therapeutic Activity: 8-22 mins PT General Charges $$ ACUTE PT VISIT: 1 Visit                   Danielle Dess, PTA 11/22/22, 9:10 AM

## 2022-11-22 NOTE — Progress Notes (Signed)
PROGRESS NOTE    Alexandria [name]  ZOX:[id number] DOB: 1935-12-06 DOA: 11/16/2022 PCP: Joaquim Nam, MD    Chief Complaint  Patient presents with   Anemia    Brief Narrative:  Patient is a 87 year old female history of COPD on home O2 3 L, HFpEF EF 60 to 65% 06/2022, CKD stage IIIb, diabetes, OSA not on CPAP, stage I NSCLC s/p radiation 2021, chronic iron deficiency anemia for several years with upper and lower endoscopy in 2017 showing hemorrhoids and diverticulosis without hemorrhage seen in the cancer center and sent to the ED due to anemia.  Patient presented with complaints of worsening shortness of breath than baseline as well as fatigue.  Hemoglobin noted at PCPs office and 7.4 down from a baseline of about 10 just 3 weeks prior to admission.  Patient did endorse some dark stools but not black in color.  Patient noted per records to have been seen by [place] GI on 08/2022, capsule endoscopy recommended however daughter declined due to risk of procedure if positive.  It is noted in 2023 the patient not considered for further endoscopic evaluation as he was 42 pose more risk than benefit.  Plan from GI was to continue monitoring with iron infusion and transfusion as needed.  CT abdomen and pelvis done on admission with extensive colonic diverticulosis.  Patient transfused a unit of PRBCs in the ED however continued to be dyspneic and patient admitted for further evaluation.   Assessment & Plan:   Principal Problem:   Acute on chronic blood loss anemia Active Problems:   DOE (dyspnea on exertion)   COPD with chronic bronchitis and emphysema (HCC)   Chronic respiratory failure with hypoxia (HCC)   OSA on CPAP   Malignant neoplasm of right upper lobe of lung (HCC)   Essential hypertension   Chronic diastolic (congestive) heart failure (HCC)   CAD (coronary artery disease)   Paroxysmal atrial fibrillation (HCC)   PAD (peripheral artery disease) (HCC)   Type 2 diabetes mellitus with  diabetic neuropathy, without long-term current use of insulin (HCC)   Stage 3b chronic kidney disease (HCC)   Hypothyroidism   Obesity, Class III, BMI 40-49.9 (morbid obesity) (HCC)   Depression, recurrent (HCC)   Symptomatic anemia   Acute on chronic diastolic CHF (congestive heart failure) (HCC)   COPD with acute exacerbation (HCC)   Confusion   Constipation  #1 acute on chronic blood loss anemia/chronic iron deficiency anemia -??  Etiology. -Patient denies any significant overt bleeding. -Patient noted to have had an EGD/colonoscopy 2017 without any etiology. -Patient seen by GI 08/2022 capsule endoscopy offered but patient and family declined.  Declined upper and lower endoscopy 2023. -Status post transfusion 2 unit PRBCs during this hospitalization with hemoglobin currently at 10.1 from 9.6 from 9.8 from 9.3 from 9.6 from 7.9 from 8.1 from 7.2 on admission. -Status post IV Venofer. -Follow H&H. -Transfusion threshold hemoglobin < 8.  2.  Dyspnea on exertion/?  Symptomatic anemia/chronic respiratory failure on home O2 3 L/probable acute COPD exacerbation/OSA intolerant of CPAP/stage I NSCLC s/p radiation 2021/??  Acute on chronic diastolic CHF -Patient's dyspnea on exertion likely multifactorial secondary to symptomatic anemia in the setting of probable acute COPD exacerbation and concern for volume overload. -Per daughter patient with a 11 pound weight gain over the past 10 to 11 days. -BP noted to be borderline on admission. -BNP at 155.4. -Status post transfusion 2 unit PRBCs however per patient no significant change in dyspnea on exertion. -  Patient with some wheezing noted on examination as such patient started treatment for acute COPD exacerbation.   -Hemoglobin at 10.1 this morning after transfusion of a total of 2 units PRBCs during this hospitalization.  -Was on IV Solu-Medrol and transitioned to prednisone 60 mg daily which was further tapered down to 40 mg daily.    -Continue rapid prednisone taper as patient noted to have developed some visual hallucinations and confusion.  -Continue scheduled Xopenex nebs, Pulmicort, Brovana, PPI, Flonase.   -Transition IV Lasix to home dose oral torsemide today.   -Strict I's and O's, daily weights.   3.  Hyperglycemia/diabetes mellitus type 2 -Likely secondary to steroids. -Patient noted to have a hemoglobin A1c 5.3 (03/16/2022) -Repeat hemoglobin A1c of 5.2. -CBG 91 this morning. -Monitor CBG with steroid taper.  -SSI.  4. Acute on chronic diastolic CHF -Per daughter patient with a 11 pound weight gain over the past 10 to 11 days with baseline weight approximately in the 220s. -Patient with recent 2D echo 06/30/2022. -Weight on presentation was 233 pounds.  -BNP of 155.4. -Weight of 232.7 pounds(11/20/2022) -Doubt accuracy of daily weights. -Urine output not properly recorded over the past 24 hours.     -Improving clinically.   -Continue decreased dose of Imdur 15 mg daily to allow for better diuresis.  -Will transition from IV Lasix to home dose of oral torsemide 40 mg daily today.   -Continue bisoprolol however if heart rate remains bradycardic will discontinue bisoprolol. -Strict I's and O's, daily weights.  5.  Hypertension -Continue bisoprolol, Imdur.   -Patient has been transition from IV Lasix back to home dose of oral torsemide.   6.  Paroxysmal A-fib/sinus bradycardia -Patient noted to have new onset A-fib in 2024 that converted with IV amiodarone. -Not a anticoagulation candidate in the setting of anemia and high fall risk. -Continue home regimen amiodarone.  Heart rate ranging from the 50s to the 70s on low-dose bisoprolol.  -Monitor heart rate on bisoprolol.  7.  PAD -Continue Zetia, statin.    8.  CKD stage IIIb -Stable.  9.  Depression -Continue home regimen Prozac. -Increase BuSpar back to home regimen of 15 mg 3 times daily.   10.  Hypothyroidism Synthroid.   11.  Morbid  obesity -Lifestyle modification -Outpatient follow-up with PCP.  12.  Constipation -Continue current bowel regimen of MiraLAX daily, Senokot-S nightly.   13.  Confusion/altered mental status -Patient with bouts of waxing and waning confusion, per staff and PT patient with some bouts of visual hallucinations noted to okay yesterday and today.. -Patient currently afebrile, patient empirically on antibiotics not an infectious etiology. -Could likely be secondary to steroids. -Patient also noted on a decreased dose of home regimen of BuSpar currently at 5 mg 3 times daily and noted to have been on 15 mg 3 times daily. -Continue rapid prednisone taper currently at 40 mg daily and will taper to 20 mg tomorrow. -Increase BuSpar back to home regimen of 15 mg 3 times daily. -Supportive care.   DVT prophylaxis: SCDs Code Status: Full Family Communication: Updated patient.  Updated daughter via speaker phone. Disposition: Likely SNF versus home with home health when medically stable with clinical improvement.    Status is: Inpatient The patient will require care spanning > 2 midnights and should be moved to inpatient because: Severity of illness   Consultants:  None  Procedures:  CT abdomen and pelvis 11/16/2022 Chest x-ray 11/16/2022 Transfusion 1 unit PRBCs 11/16/2022 Transfusion 1 unit PRBC 11/18/2022  Antimicrobials:  Anti-infectives (From admission, onward)    Start     Dose/Rate Route Frequency Ordered Stop   11/20/22 1015  cefdinir (OMNICEF) capsule 300 mg        300 mg Oral Every 12 hours 11/20/22 0924 11/21/22 2211   11/17/22 2100  cefTRIAXone (ROCEPHIN) 2 g in sodium chloride 0.9 % 100 mL IVPB  Status:  Discontinued        2 g 200 mL/hr over 30 Minutes Intravenous Every 24 hours 11/17/22 1949 11/20/22 0924         Subjective: Sitting on bedside commode.  Per RN noted to be confused this morning with some hallucinations.   -On my assessment patient is alert oriented to  self place and time.  Patient denying any auditory or visual hallucinations during my exam.  Patient denied any suicidal or homicidal ideation.  Patient feels shortness of breath is improved.  Denies any chest pain.    Objective: Vitals:   11/21/22 2107 11/22/22 0400 11/22/22 0500 11/22/22 0729  BP: 105/85 130/68  (!) 167/89  Pulse: 76 60  (!) 57  Resp: 20 20  20   Temp: 98.2 F (36.8 C) 97.7 F (36.5 C)  97.8 F (36.6 C)  TempSrc:  Oral    SpO2: 97% 98%  94%  Weight:   105.8 kg   Height:        Intake/Output Summary (Last 24 hours) at 11/22/2022 1110 Last data filed at 11/22/2022 1108 Gross per 24 hour  Intake 480 ml  Output 150 ml  Net 330 ml   Filed Weights   11/19/22 0353 11/20/22 0500 11/22/22 0500  Weight: 105.2 kg 105.6 kg 105.8 kg    Examination:  General exam: NAD. Respiratory system: Decreased bibasilar crackles.  Minimal expiratory wheezing.  Upper airway noise.  Fair air movement.  No use of accessory muscles of respiration.  Speaking in full sentences.   Cardiovascular system: Regular rate and rhythm with 3/6 SEM right upper sternal border.  No significant pitting lower extremity edema.  Gastrointestinal system: Abdomen is soft, nontender, nondistended, positive bowel sounds.  No rebound.  No guarding.   Central nervous system: Alert and oriented x 3. No focal neurological deficits. Extremities: Symmetric 5 x 5 power. Skin: No rashes, lesions or ulcers Psychiatry: Judgement and insight appear poor to fair. Mood & affect appropriate.     Data Reviewed: I have personally reviewed following labs and imaging studies  CBC: Recent Labs  Lab 11/16/22 1401 11/16/22 1524 11/18/22 1923 11/19/22 0329 11/20/22 0452 11/21/22 0513 11/22/22 0457  WBC 6.3   < > 12.3* 9.2 7.6 6.0 6.1  NEUTROABS 4.8  --   --   --   --   --   --   HGB 7.2*   < > 9.6* 9.3* 9.8* 9.6* 10.1*  HCT 24.7*   < > 30.8* 30.0* 31.9* 30.8* 32.3*  MCV 106.9*   < > 101.3* 101.4* 100.9* 100.7*  100.9*  PLT 197   < > 209 195 190 189 163   < > = values in this interval not displayed.    Basic Metabolic Panel: Recent Labs  Lab 11/18/22 0448 11/19/22 0329 11/20/22 0452 11/21/22 0513 11/22/22 0457  NA 141 141 143 142 144  K 4.1 3.8 4.2 3.5 3.4*  CL 95* 93* 94* 94* 98  CO2 34* 34* 38* 35* 35*  GLUCOSE 203* 278* 135* 174* 104*  BUN 32* 34* 46* 50* 46*  CREATININE 1.28* 1.32* 1.24* 1.20*  1.21*  CALCIUM 8.5* 8.3* 8.3* 8.1* 8.6*  MG 2.7* 2.4  --   --   --     GFR: Estimated Creatinine Clearance: 41.1 mL/min (A) (by C-G formula based on SCr of 1.21 mg/dL (H)).  Liver Function Tests: No results for input(s): "AST", "ALT", "ALKPHOS", "BILITOT", "PROT", "ALBUMIN" in the last 168 hours.  CBG: Recent Labs  Lab 11/21/22 1011 11/21/22 1251 11/21/22 1702 11/21/22 2105 11/22/22 0731  GLUCAP 192* 137* 184* 299* 91     No results found for this or any previous visit (from the past 240 hour(s)).       Radiology Studies: No results found.      Scheduled Meds:  amiodarone  200 mg Oral Daily   arformoterol  15 mcg Nebulization QHS   bisoprolol  2.5 mg Oral Daily   budesonide (PULMICORT) nebulizer solution  0.5 mg Nebulization BID   busPIRone  15 mg Oral TID   busPIRone  5 mg Oral Once   cholecalciferol  2,000 Units Oral Daily   cyanocobalamin  5,000 mcg Oral Daily   ezetimibe  10 mg Oral Daily   ferrous sulfate  325 mg Oral Daily   FLUoxetine  20 mg Oral BID   fluticasone  2 spray Each Nare Daily   influenza vaccine adjuvanted  0.5 mL Intramuscular Tomorrow-1000   insulin aspart  0-20 Units Subcutaneous TID WC   insulin aspart  0-5 Units Subcutaneous QHS   isosorbide mononitrate  15 mg Oral Daily   levalbuterol  0.63 mg Nebulization BID   levothyroxine  125 mcg Oral Q0600   loratadine  10 mg Oral Daily   pantoprazole  40 mg Oral Q0600   polyethylene glycol  17 g Oral Daily   [START ON 11/23/2022] potassium chloride  10 mEq Oral Daily   predniSONE  40 mg  Oral QAC breakfast   rosuvastatin  10 mg Oral QODAY   senna-docusate  1 tablet Oral QHS   torsemide  40 mg Oral Daily   Continuous Infusions:     LOS: 5 days    Time spent: 40 minutes    Ramiro Harvest, MD [facility]   To contact the attending provider between 7A-7P or the covering provider during after hours 7P-7A, please log into the web site [email or web address] and access using universal [place] password for that web site. If you do not have the password, please call the hospital operator.  11/22/2022, 11:10 AM

## 2022-11-22 NOTE — Care Management Important Message (Signed)
Important Message  Patient Details  Name: Alexandria [name] MRN: [id number] Date of Birth: 01-28-1936   Medicare Important Message Given:  Yes     Johnell Comings 11/22/2022, 10:16 AM

## 2022-11-22 NOTE — Plan of Care (Signed)
Unable to educate due to confusion

## 2022-11-22 NOTE — Plan of Care (Signed)

## 2022-11-23 ENCOUNTER — Ambulatory Visit: Payer: Medicare Other | Admitting: Cardiovascular Disease

## 2022-11-23 ENCOUNTER — Inpatient Hospital Stay: Payer: Medicare Other

## 2022-11-23 DIAGNOSIS — I480 Paroxysmal atrial fibrillation: Secondary | ICD-10-CM | POA: Diagnosis not present

## 2022-11-23 DIAGNOSIS — D62 Acute posthemorrhagic anemia: Secondary | ICD-10-CM | POA: Diagnosis not present

## 2022-11-23 DIAGNOSIS — I25118 Atherosclerotic heart disease of native coronary artery with other forms of angina pectoris: Secondary | ICD-10-CM

## 2022-11-23 DIAGNOSIS — I890 Lymphedema, not elsewhere classified: Secondary | ICD-10-CM

## 2022-11-23 DIAGNOSIS — J4489 Other specified chronic obstructive pulmonary disease: Secondary | ICD-10-CM

## 2022-11-23 DIAGNOSIS — I5032 Chronic diastolic (congestive) heart failure: Secondary | ICD-10-CM | POA: Diagnosis not present

## 2022-11-23 DIAGNOSIS — E870 Hyperosmolality and hypernatremia: Secondary | ICD-10-CM

## 2022-11-23 DIAGNOSIS — E1122 Type 2 diabetes mellitus with diabetic chronic kidney disease: Secondary | ICD-10-CM

## 2022-11-23 DIAGNOSIS — I10 Essential (primary) hypertension: Secondary | ICD-10-CM

## 2022-11-23 DIAGNOSIS — E782 Mixed hyperlipidemia: Secondary | ICD-10-CM

## 2022-11-23 LAB — GLUCOSE, CAPILLARY
Glucose-Capillary: 71 mg/dL (ref 70–99)
Glucose-Capillary: 204 mg/dL — ABNORMAL HIGH (ref 70–99)
Glucose-Capillary: 189 mg/dL — ABNORMAL HIGH (ref 70–99)
Glucose-Capillary: 308 mg/dL — ABNORMAL HIGH (ref 70–99)
Glucose-Capillary: 258 mg/dL — ABNORMAL HIGH (ref 70–99)

## 2022-11-23 LAB — BLOOD GAS, ARTERIAL
Acid-Base Excess: 15.7 mmol/L — ABNORMAL HIGH (ref 0.0–2.0)
Bicarbonate: 43.1 mmol/L — ABNORMAL HIGH (ref 20.0–28.0)
O2 Content: 2 L/min
O2 Saturation: 99.7 %
Patient temperature: 37
pCO2 arterial: 68 mmHg (ref 32–48)
pH, Arterial: 7.41 (ref 7.35–7.45)
pO2, Arterial: 94 mmHg (ref 83–108)

## 2022-11-23 LAB — SARS CORONAVIRUS 2 BY RT PCR: SARS Coronavirus 2 by RT PCR: NEGATIVE

## 2022-11-23 LAB — URINE CULTURE: Culture: <10000 — AB

## 2022-11-23 LAB — CBC
HCT: 37.3 % (ref 36.0–46.0)
Hemoglobin: 11.4 g/dL — ABNORMAL LOW (ref 12.0–15.0)
MCH: 31.3 pg (ref 26.0–34.0)
MCHC: 30.6 g/dL (ref 30.0–36.0)
MCV: 102.5 fL — ABNORMAL HIGH (ref 80.0–100.0)
Platelets: 174 10*3/uL (ref 150–400)
RBC: 3.64 MIL/uL — ABNORMAL LOW (ref 3.87–5.11)
RDW: 14.8 % (ref 11.5–15.5)
WBC: 6.7 10*3/uL (ref 4.0–10.5)
nRBC: 0 % (ref 0.0–0.2)

## 2022-11-23 LAB — BASIC METABOLIC PANEL
Anion gap: 10 (ref 5–15)
BUN: 42 mg/dL — ABNORMAL HIGH (ref 8–23)
CO2: 40 mmol/L — ABNORMAL HIGH (ref 22–32)
Calcium: 8.7 mg/dL — ABNORMAL LOW (ref 8.9–10.3)
Chloride: 97 mmol/L — ABNORMAL LOW (ref 98–111)
Creatinine, Ser: 1.5 mg/dL — ABNORMAL HIGH (ref 0.44–1.00)
GFR, Estimated: 34 mL/min — ABNORMAL LOW (ref >60)
Glucose, Bld: 92 mg/dL (ref 70–99)
Potassium: 4.2 mmol/L (ref 3.5–5.1)
Sodium: 147 mmol/L — ABNORMAL HIGH (ref 135–145)

## 2022-11-23 LAB — MAGNESIUM: Magnesium: 2.5 mg/dL — ABNORMAL HIGH (ref 1.7–2.4)

## 2022-11-23 MED ORDER — POTASSIUM CHLORIDE CRYS ER 10 MEQ PO TBCR
10.0000 meq | EXTENDED_RELEASE_TABLET | Freq: Every day | ORAL | Status: DC
  Administered 2022-11-24 – 2022-11-26 (×3): 10 meq via ORAL
  Filled 2022-11-23 (×3): qty 1

## 2022-11-23 MED ORDER — SODIUM CHLORIDE 0.9 % IV SOLN: INTRAVENOUS | Status: DC | PRN

## 2022-11-23 MED ORDER — LEVOFLOXACIN IN D5W 500 MG/100ML IV SOLN: 500.0000 mg | INTRAVENOUS | Status: DC

## 2022-11-23 MED ORDER — LEVOFLOXACIN IN D5W 250 MG/50ML IV SOLN
250.0000 mg | INTRAVENOUS | Status: DC
  Administered 2022-11-23 – 2022-11-24 (×2): 250 mg via INTRAVENOUS
  Filled 2022-11-23 (×2): qty 50

## 2022-11-23 MED ORDER — TORSEMIDE 20 MG PO TABS: 40.0000 mg | ORAL_TABLET | Freq: Every day | ORAL | Status: DC

## 2022-11-23 NOTE — Progress Notes (Signed)
Physical Therapy Treatment Patient Details Name: Alexandria [name] MRN: [id number] DOB: 1935-06-15 Today's Date: 11/23/2022   History of Present Illness 87 year old female history of COPD on home O2 3 L, HFpEF EF 60 to 65% 06/2022, CKD stage IIIb, diabetes, OSA not on CPAP, stage I NSCLC s/p radiation 2021, chronic iron deficiency anemia for several years with upper and lower endoscopy in 2017 showing hemorrhoids and diverticulosis without hemorrhage seen in the cancer center and sent to the ED due to anemia.  Patient presented with complaints of worsening shortness of breath than baseline as well as fatigue.    PT Comments  Pt in bed asleep.  Breakfast tray arrived but she is unaware.  Awakens and needs min a x 1 to get to EOB.  Transfers to [facility] with set up and CGA x 1.  After voiding she is able to progress gait to nursing station and back to room with RW and Min a x  1.  Gait generally weak and unsteady but no LOB's noted.  3 LPM O2.  Compared to gait on Sunday she has declined in gait quality and balance.  She does remain up in chair to eat breakfast with alarm on and all needs in place.  She seems more cognitively intact today making no odd statements or movements but does state she is here for "attitude problems" which she told me yesterday.  Pt has had general decline in activity tolerance and balance this week-end.  While cognition has greatly improved this session, will adjust discharge recommendations.  She would benefit from continued therapies at discharge to return to baseline for a safe discharge home.   If plan is discharge home, recommend the following: A little help with walking and/or transfers;A little help with bathing/dressing/bathroom;Assistance with cooking/housework;Assist for transportation   Can travel by private vehicle        Equipment Recommendations  None recommended by PT    Recommendations for Other Services       Precautions / Restrictions  Precautions Precautions: Fall Restrictions Weight Bearing Restrictions: No     Mobility  Bed Mobility Overal bed mobility: Needs Assistance Bed Mobility: Supine to Sit     Supine to sit: Min assist     General bed mobility comments: reaches for assist today    Transfers Overall transfer level: Needs assistance Equipment used: Rolling walker (2 wheels)   Sit to Stand: Min assist, Contact guard assist                Ambulation/Gait Ambulation/Gait assistance: Min assist Gait Distance (Feet): 50 Feet Assistive device: Rolling walker (2 wheels)   Gait velocity: decreased     General Gait Details: no LOB's today but gait generally weak and requires +1 assist   Stairs             Wheelchair Mobility     Tilt Bed    Modified Rankin (Stroke Patients Only)       Balance Overall balance assessment: Needs assistance Sitting-balance support: Feet supported Sitting balance-[name]: Fair     Standing balance support: Reliant on assistive device for balance, During functional activity, Bilateral upper extremity supported Standing balance-[name]: Fair Standing balance comment: +1 min assist for general safety, would be unsafe to walk unassisted                            Cognition Arousal: Alert Behavior During Therapy: [facility] for tasks assessed/performed Overall Cognitive  Status: Impaired/Different from baseline                                 General Comments: more intact today with no unusual comments or motions        Exercises Other Exercises Other Exercises: to [facility] to void    General Comments        Pertinent Vitals/Pain Pain Assessment Pain Assessment: No/denies pain    Home Living                          Prior Function            PT Goals (current goals can now be found in the care plan section) Progress towards PT goals: Progressing toward goals    Frequency    Min  1X/week      PT Plan      Co-evaluation              AM-PAC PT "6 Clicks" Mobility   Outcome Measure  Help needed turning from your back to your side while in a flat bed without using bedrails?: A Little Help needed moving from lying on your back to sitting on the side of a flat bed without using bedrails?: A Little Help needed moving to and from a bed to a chair (including a wheelchair)?: A Little Help needed standing up from a chair using your arms (e.g., wheelchair or bedside chair)?: A Little Help needed to walk in hospital room?: A Little Help needed climbing 3-5 steps with a railing? : A Lot 6 Click Score: 17    End of Session Equipment Utilized During Treatment: Gait belt Activity Tolerance: Patient tolerated treatment well Patient left: in chair;with call bell/phone within reach;with chair alarm set Nurse Communication: Mobility status PT Visit Diagnosis: Unsteadiness on feet (R26.81);Muscle weakness (generalized) (M62.81);Difficulty in walking, not elsewhere classified (R26.2)     Time: 0865-7846 PT Time Calculation (min) (ACUTE ONLY): 18 min  Charges:    $Gait Training: 8-22 mins PT General Charges $$ ACUTE PT VISIT: 1 Visit                   Danielle Dess, PTA 11/23/22, 9:34 AM

## 2022-11-23 NOTE — Consult Note (Signed)
PULMONOLOGY         Date: 11/23/2022,   MRN# [id number] Alexandria [name] 31-Mar-1935     AdmissionWeight: 105.7 kg                 CurrentWeight: 105.8 kg  Referring provider: Dr Charlesetta Shanks   CHIEF COMPLAINT:   Acute on chronic hypoxemic hypercarbic respiratory failure   HISTORY OF PRESENT ILLNESS   This is an 87 yo F with hx of chronic anxiety disorder, COPD with chronic hypoxemia requiring 2-3L/min [facility] supplemental O2, morbid obesity and episodes of hypercapnic encephalopathy.  She had admission for AECOPD with hypercapnia and required BIPAP ventilation for treatment in 2024.  She is accompanied by daughter who shares similar encephalopathy was present on prior hospitalization.  Her peripheral blood BMP shows bicarb chronically compensated with HCO3>40.  I was unable to obtain interview from patient due to encephalopathy. She had CXR today with no pulmonary edema or pneumonia noted.    PAST MEDICAL HISTORY   Past Medical History:  Diagnosis Date   Allergy, unspecified not elsewhere classified    Anxiety    Aortic stenosis, mild 03/30/2022   a. TTE 03/29/2022: EF 55-60, no RWMA, GR 1 DD, normal RVSF, mild aortic stenosis (mean 10, V-max 212 cm/s, DI 0.70)   Cataract    Chronic diastolic congestive heart failure (HCC)    a. 06/2022 Echo: EF 60-65%, no rwma, nl RV fxn, mild MR, mild AS.   COPD (chronic obstructive pulmonary disease) (HCC) 03/08/1998   PFTs 12/12/2002 FEV 1 1.42 (64%) ratio 58 with no better after B2 and DLCO75%; PFTs 11/20/09 FEV1 1.50 (73%) ratio 50 no better after B2 with DLCO 62%; Hfa 50% 11/20/2009 >75%, 01/13/10 p coaching   Coronary artery calcification seen on CT scan    a. 2012 Neg stress test.   Depression 12/07/1998   Diabetes mellitus type II 02/05/2006   Dr. Elvera Lennox with endo   Disorders of bursae and tendons in shoulder region, unspecified    Rotator cuff syndrome, right   E. coli bacteremia    Esophagitis    GERD (gastroesophageal reflux  disease)    History of UTI    Hyperlipidemia 10/04/2000   Hypertension 03/08/1992   Hypothyroidism 03/08/1968   Iron deficiency anemia    Lung nodule    radiation starts 10-08-2019   Malignant neoplasm of right upper lobe of lung (HCC) 07/24/2019   Obesity    NOS   OSA (obstructive sleep apnea)    PSG 01/27/10 AHI 13, pt does not know CPAP settings   PAF (paroxysmal atrial fibrillation) (HCC)    a. 06/2022 in setting of UTI/encephalopathy-->converted on amio; b. CHA2DS2VASc = 6-->No OAC 2/2 chronic anemia/fall risk; c. 06/2022 Zio: Predominantly sinus bradycardia at 54 (3-158)'s.  1 brief run of SVT.  2.2% PVC burden.   Peripheral neuropathy    Likely due to DM per Dr. Tresa Endo hernia      SURGICAL HISTORY   Past Surgical History:  Procedure Laterality Date   ABD U/S  03/19/1999   Nml x2 foci in liver   ADENOSINE MYOVIEW  06/02/2007   Nml   CARDIOLITE PERSANTINE  08/24/2000   Nml   CAROTID U/S  08/24/2000   1-39% ICA stenosis   CAROTID U/S  06/02/2007   No apprec change    CARPAL TUNNEL RELEASE  12/1997   Right   CESAREAN SECTION     x2 Breech/ repeat   CHOLECYSTECTOMY  1997   COLONOSCOPY WITH PROPOFOL N/A 02/12/2016   Procedure: COLONOSCOPY WITH PROPOFOL;  Surgeon: Rachael Fee, MD;  Location: [facility];  Service: Endoscopy;  Laterality: N/A;   CT ABD W & PELVIS WO/W CM     Abd hemangiomas of liver, 1 cm R renal cyst   DENTAL SURGERY  2016   Implants   DEXA  07/03/2003   Nml   ESOPHAGOGASTRODUODENOSCOPY  12/05/1997   Nml (due to hoarseness)   ESOPHAGOGASTRODUODENOSCOPY (EGD) WITH PROPOFOL N/A 02/12/2016   Procedure: ESOPHAGOGASTRODUODENOSCOPY (EGD) WITH PROPOFOL;  Surgeon: Rachael Fee, MD;  Location: [facility];  Service: Endoscopy;  Laterality: N/A;   GALLBLADDER SURGERY     HERNIA REPAIR  01/24/2009   Lap Ventr w/ Lysis of adhesions (Dr. Dwain Sarna)   knee arthroscopic surgery  years ago   right   ROTATOR CUFF REPAIR  1984   Right, [place]    SHOULDER OPEN ROTATOR CUFF REPAIR  02/08/2012   Procedure: ROTATOR CUFF REPAIR SHOULDER OPEN;  Surgeon: Drucilla Schmidt, MD;  Location: [facility];  Service: Orthopedics;  Laterality: Left;  Left Shoulder Open Anterior Acrominectomy Rotator Cuff Repair Open Distal Clavicle Resection ,tissue mend graft, and repair of biceps tendon   THUMB RELEASE  12/1997   Right   TONSILLECTOMY     TOTAL ABDOMINAL HYSTERECTOMY  1985   Due to dysmennorhea   US ECHOCARDIOGRAPHY  06/02/2007     FAMILY HISTORY   Family History  Problem Relation Age of Onset   Heart disease Mother    Thyroid disease Mother    Emphysema Father        One lung   Cystic fibrosis Sister    Hyperthyroidism Sister    Osteoarthritis Brother    Hyperthyroidism Brother    Esophageal cancer Neg Hx    Cancer Neg Hx        Head or neck   Colon cancer Neg Hx    Stomach cancer Neg Hx    Breast cancer Neg Hx      SOCIAL HISTORY   Social History   Tobacco Use   Smoking status: Former    Current packs/day: 0.00    Average packs/day: 2.0 packs/day for 50.0 years (100.0 ttl pk-yrs)    Types: Cigarettes    Start date: 02/06/1955    Quit date: 02/05/2005    Years since quitting: 17.8   Smokeless tobacco: Never  Vaping Use   Vaping status: Never Used  Substance Use Topics   Alcohol use: Yes    Comment: occasionally   Drug use: Never     MEDICATIONS    Home Medication:    Current Medication:  Current Facility-Administered Medications:    acetaminophen (TYLENOL) tablet 650 mg, 650 mg, Oral, Q6H PRN **OR** acetaminophen (TYLENOL) suppository 650 mg, 650 mg, Rectal, Q6H PRN, Andris Baumann, MD   amiodarone (PACERONE) tablet 200 mg, 200 mg, Oral, Daily, Rodolph Bong, MD, 200 mg at 11/23/22 1121   arformoterol (BROVANA) nebulizer solution 15 mcg, 15 mcg, Nebulization, QHS, Rodolph Bong, MD, 15 mcg at 11/22/22 2103   bisoprolol (ZEBETA) tablet 2.5 mg, 2.5 mg, Oral, Daily, Lindajo Royal V, MD, 2.5 mg at 11/23/22  1122   budesonide (PULMICORT) nebulizer solution 0.5 mg, 0.5 mg, Nebulization, BID, Rodolph Bong, MD, 0.5 mg at 11/23/22 0718   busPIRone (BUSPAR) tablet 15 mg, 15 mg, Oral, TID, Rodolph Bong, MD, 15 mg at 11/23/22 1120   cholecalciferol (VITAMIN D3) 25 MCG (1000  UNIT) tablet 2,000 Units, 2,000 Units, Oral, Daily, Andris Baumann, MD, 2,000 Units at 11/23/22 1120   cyanocobalamin (VITAMIN B12) tablet 5,000 mcg, 5,000 mcg, Oral, Daily, Lindajo Royal V, MD, 5,000 mcg at 11/23/22 1120   ezetimibe (ZETIA) tablet 10 mg, 10 mg, Oral, Daily, Rodolph Bong, MD, 10 mg at 11/23/22 1123   ferrous sulfate tablet 325 mg, 325 mg, Oral, Daily, Lindajo Royal V, MD, 325 mg at 11/23/22 1121   FLUoxetine (PROZAC) capsule 20 mg, 20 mg, Oral, BID, Rodolph Bong, MD, 20 mg at 11/23/22 1123   fluticasone (FLONASE) 50 MCG/ACT nasal spray 2 spray, 2 spray, Each Nare, Daily, Rodolph Bong, MD, 2 spray at 11/23/22 1202   influenza vaccine adjuvanted (FLUAD) injection 0.5 mL, 0.5 mL, Intramuscular, Tomorrow-1000, Rodolph Bong, MD   insulin aspart (novoLOG) injection 0-20 Units, 0-20 Units, Subcutaneous, TID WC, Andris Baumann, MD, 7 Units at 11/23/22 1205   insulin aspart (novoLOG) injection 0-5 Units, 0-5 Units, Subcutaneous, QHS, Andris Baumann, MD, 3 Units at 11/21/22 2212   isosorbide mononitrate (IMDUR) 24 hr tablet 15 mg, 15 mg, Oral, Daily, Rodolph Bong, MD, 15 mg at 11/23/22 1121   levalbuterol (XOPENEX) nebulizer solution 0.63 mg, 0.63 mg, Nebulization, Q2H PRN, Rodolph Bong, MD   levalbuterol Pauline Aus) nebulizer solution 0.63 mg, 0.63 mg, Nebulization, BID, Rodolph Bong, MD, 0.63 mg at 11/23/22 0716   levothyroxine (SYNTHROID) tablet 125 mcg, 125 mcg, Oral, Q0600, Andris Baumann, MD, 125 mcg at 11/23/22 0522   loratadine (CLARITIN) tablet 10 mg, 10 mg, Oral, Daily, Rodolph Bong, MD, 10 mg at 11/23/22 1128   nitroGLYCERIN (NITROSTAT) SL tablet 0.4 mg, 0.4 mg,  Sublingual, Q5 min PRN, Andris Baumann, MD   ondansetron [facility]) tablet 4 mg, 4 mg, Oral, Q6H PRN **OR** ondansetron (ZOFRAN) injection 4 mg, 4 mg, Intravenous, Q6H PRN, Andris Baumann, MD   Oral care mouth rinse, 15 mL, Mouth Rinse, PRN, Rodolph Bong, MD   pantoprazole (PROTONIX) EC tablet 40 mg, 40 mg, Oral, Q0600, Rodolph Bong, MD, 40 mg at 11/23/22 0522   polyethylene glycol (MIRALAX / GLYCOLAX) packet 17 g, 17 g, Oral, Daily, Rodolph Bong, MD, 17 g at 11/23/22 1127   [START ON 11/24/2022] potassium chloride (KLOR-CON M) CR tablet 10 mEq, 10 mEq, Oral, Daily, Rodolph Bong, MD   predniSONE (DELTASONE) tablet 20 mg, 20 mg, Oral, QAC breakfast, Rodolph Bong, MD, 20 mg at 11/23/22 1121   rosuvastatin (CRESTOR) tablet 10 mg, 10 mg, Oral, QODAY, Andris Baumann, MD, 10 mg at 11/22/22 0933   senna-docusate (Senokot-S) tablet 1 tablet, 1 tablet, Oral, QHS, Rodolph Bong, MD, 1 tablet at 11/22/22 2206  Facility-Administered Medications Ordered in Other Encounters:    0.9 %  sodium chloride infusion, , Intravenous, Once, Brahmanday, Govinda R, MD   0.9 %  sodium chloride infusion, , Intravenous, Continuous, Louretta Shorten R, MD, Last Rate: 20 mL/hr at 11/02/22 1421, New Bag at 11/02/22 1421    ALLERGIES   Antihistamines, diphenhydramine-type; Incruse ellipta [umeclidinium bromide]; Clarithromycin; Codeine; Diphenhydramine; Pregabalin; Sulfonamide derivatives; Tiotropium bromide monohydrate; Umeclidinium; and Vraylar [cariprazine]     REVIEW OF SYSTEMS    Review of Systems:  Gen:  Denies  fever, sweats, chills weigh loss  HEENT: Denies blurred vision, double vision, ear pain, eye pain, hearing loss, nose bleeds, sore throat Cardiac:  No dizziness, chest pain or heaviness, chest tightness,edema Resp:   reports dyspnea  chronically  Gi: Denies swallowing difficulty, stomach pain, nausea or vomiting, diarrhea, constipation, bowel incontinence Gu:   Denies bladder incontinence, burning urine Ext:   Denies Joint pain, stiffness or swelling Skin: Denies  skin rash, easy bruising or bleeding or hives Endoc:  Denies polyuria, polydipsia , polyphagia or weight change Psych:   Denies depression, insomnia or hallucinations   Other:  All other systems negative   VS: BP (!) 149/46 (BP Location: Left Arm)   Pulse 60   Temp 97.8 F (36.6 C) (Oral)   Resp 18   Ht 5\' 5"  (1.651 m)   Wt 105.8 kg   LMP  (LMP Unknown)   SpO2 97%   BMI 38.81 kg/m      PHYSICAL EXAM    GENERAL:NAD, no fevers, chills, no weakness no fatigue HEAD: Normocephalic, atraumatic.  EYES: Pupils equal, round, reactive to light. Extraocular muscles intact. No scleral icterus.  MOUTH: Moist mucosal membrane. Dentition intact. No abscess noted.  EAR, NOSE, THROAT: Clear without exudates. No external lesions.  NECK: Supple. No thyromegaly. No nodules. No JVD.  PULMONARY: decreased breath sounds with mild rhonchi worse at bases bilaterally.  CARDIOVASCULAR: S1 and S2. Regular rate and rhythm. No murmurs, rubs, or gallops. No edema. Pedal pulses 2+ bilaterally.  GASTROINTESTINAL: Soft, nontender, nondistended. No masses. Positive bowel sounds. No hepatosplenomegaly.  MUSCULOSKELETAL: No swelling, clubbing, or edema. Range of motion full in all extremities.  NEUROLOGIC: Cranial nerves II through XII are intact. No gross focal neurological deficits. Sensation intact. Reflexes intact.  SKIN: No ulceration, lesions, rashes, or cyanosis. Skin warm and dry. Turgor intact.  PSYCHIATRIC: Mood, affect within normal limits. The patient is awake, alert and oriented x 3. Insight, judgment intact.       IMAGING  Reivewed CXR from today  ASSESSMENT/PLAN   Acute on chronic hypoxemic hypercarbic respiratory failure    Due to Acute COPD exacerbation with hypoventilation [place] overlap -patient is on steroids and antimicrobials -patient is already receiving supplemental O2  with goal spO2 89-94% -ABG to evaluate need for BIPAP support with hypercapnia -RVP to rule out viral etiology of current exacerbation -CRP for background inflammation level in context of AECOPD -patient normally sees pulmonary with Dr Craige Cotta.  -will continue to follow along with you           Thank you for allowing me to participate in the care of this patient.   Patient/Family are satisfied with care plan and all questions have been answered.    Provider disclosure: Patient with at least one acute or chronic illness or injury that poses a threat to life or bodily function and is being managed actively during this encounter.  All of the below services have been performed independently by signing provider:  review of prior documentation from internal and or external health records.  Review of previous and current lab results.  Interview and comprehensive assessment during patient visit today. Review of current and previous chest radiographs/CT scans. Discussion of management and test interpretation with health care team and patient/family.   This document was prepared using Dragon voice recognition software and may include unintentional dictation errors.     Vida Rigger, M.D.  Division of Pulmonary & Critical Care Medicine

## 2022-11-23 NOTE — Progress Notes (Addendum)
PROGRESS NOTE    Alexandria [name]  ZOX:[id number] DOB: 1935-10-08 DOA: 11/16/2022 PCP: Joaquim Nam, MD    Chief Complaint  Patient presents with   Anemia    Brief Narrative:  Patient is a 87 year old female history of COPD on home O2 3 L, HFpEF EF 60 to 65% 06/2022, CKD stage IIIb, diabetes, OSA not on CPAP, stage I NSCLC s/p radiation 2021, chronic iron deficiency anemia for several years with upper and lower endoscopy in 2017 showing hemorrhoids and diverticulosis without hemorrhage seen in the cancer center and sent to the ED due to anemia.  Patient presented with complaints of worsening shortness of breath than baseline as well as fatigue.  Hemoglobin noted at PCPs office and 7.4 down from a baseline of about 10 just 3 weeks prior to admission.  Patient did endorse some dark stools but not black in color.  Patient noted per records to have been seen by [place], GI on 08/2022, capsule endoscopy recommended however daughter declined due to risk of procedure if positive.  It is noted in 2023 the patient not considered for further endoscopic evaluation as he was 40 pose more risk than benefit.  Plan from GI was to continue monitoring with iron infusion and transfusion as needed.  CT abdomen and pelvis done on admission with extensive colonic diverticulosis.  Patient transfused a unit of PRBCs in the ED however continued to be dyspneic and patient admitted for further evaluation.   Assessment & Plan:   Principal Problem:   Acute on chronic blood loss anemia Active Problems:   DOE (dyspnea on exertion)   COPD with chronic bronchitis and emphysema (HCC)   Chronic respiratory failure with hypoxia (HCC)   OSA on CPAP   Malignant neoplasm of right upper lobe of lung (HCC)   Essential hypertension   Chronic diastolic (congestive) heart failure (HCC)   CAD (coronary artery disease)   Paroxysmal atrial fibrillation (HCC)   PAD (peripheral artery disease) (HCC)   Type 2 diabetes mellitus with  diabetic neuropathy, without long-term current use of insulin (HCC)   Stage 3b chronic kidney disease (HCC)   Hypothyroidism   Obesity, Class III, BMI 40-49.9 (morbid obesity) (HCC)   Depression, recurrent (HCC)   Symptomatic anemia   Acute on chronic diastolic CHF (congestive heart failure) (HCC)   COPD with acute exacerbation (HCC)   Confusion   Constipation   Hypernatremia  #1 acute on chronic blood loss anemia/chronic iron deficiency anemia -??  Etiology. -Patient denies any significant overt bleeding. -Patient noted to have had an EGD/colonoscopy 2017 without any etiology. -Patient seen by GI 08/2022 capsule endoscopy offered but patient and family declined.  Declined upper and lower endoscopy 2023. -Status post transfusion 2 unit PRBCs during this hospitalization with hemoglobin currently at 11.4 from 10.1 from 9.6 from 9.8 from 9.3 from 9.6 from 7.9 from 8.1 from 7.2 on admission. -Status post IV Venofer. -Follow H&H. -Transfusion threshold hemoglobin < 8.  2.  Dyspnea on exertion/?  Symptomatic anemia/chronic respiratory failure on home O2 3 L/probable acute COPD exacerbation/OSA intolerant of CPAP/stage I NSCLC s/p radiation 2021/??  Acute on chronic diastolic CHF -Patient's dyspnea on exertion likely multifactorial secondary to symptomatic anemia in the setting of probable acute COPD exacerbation and concern for volume overload. -Per daughter patient with a 11 pound weight gain over the past 10 to 11 days. -BP noted to be borderline on admission. -BNP at 155.4. -Status post transfusion 2 unit PRBCs however per patient no significant  change in dyspnea on exertion. -Patient with some wheezing noted on examination as such patient started treatment for acute COPD exacerbation.   -Hemoglobin at 11.4 this morning after transfusion of a total of 2 units PRBCs during this hospitalization.  -Was on IV Solu-Medrol and transitioned to prednisone 60 mg daily which was further tapered down  to 40 mg daily and subsequently 20 mg daily.   -Continue rapid prednisone taper as patient noted to have developed some visual hallucinations and confusion.  -Patient with noted upper airway noise.  Daughter insistent on repeat chest x-ray which has been ordered. -Continue scheduled Xopenex nebs, Pulmicort, Brovana, PPI, Flonase.   -IV Lasix has been transition to oral torsemide however will hold dose today due to hypernatremia/bump in sodium levels.  -Titrate O2 to keep sats between 88 to 94%. -Strict I's and O's, daily weights.   3.  Hyperglycemia/diabetes mellitus type 2 -Likely secondary to steroids. -Patient noted to have a hemoglobin A1c 5.3 (03/16/2022) -Repeat hemoglobin A1c of 5.2. -CBG noted at 71 this morning.   -Follow CBG with steroid taper.   -SSI.  4. Acute on chronic diastolic CHF -Per daughter patient with a 11 pound weight gain over the past 10 to 11 days with baseline weight approximately in the 220s. -Patient with recent 2D echo 06/30/2022. -Weight on presentation was 233 pounds.  -BNP of 155.4. -Weight of 232.7 pounds(11/20/2022) -Doubt accuracy of daily weights. -Urine output of 1.4 L recorded over the past 24 hours.    -Clinical improvement.  -Continue decreased dose of Imdur 15 mg daily to allow for better diuresis.  -IV Lasix was transitioned to oral home dose torsemide of 40 mg daily however due to bump in sodium levels/hypernatremia and bump in creatinine will hold torsemide today. -Continue bisoprolol however if heart rate remains bradycardic will discontinue bisoprolol. -Strict I's and O's, daily weights.  5.  Hypertension -Continue Imdur, bisoprolol.   -Hold torsemide today due to slight bump in sodium levels and resume tomorrow.   6.  Paroxysmal A-fib/sinus bradycardia -Patient noted to have new onset A-fib in 2024 that converted with IV amiodarone. -Not a anticoagulation candidate in the setting of anemia and high fall risk. -Continue home regimen  amiodarone.  Heart rate ranging from the 50s to the 70s on low-dose bisoprolol.  -Monitor heart rate with bisoprolol.    7.  PAD -Continue statin, Zetia.    8.  CKD stage IIIb -Stable.  9.  Depression -Continue home regimen of Prozac.   -BuSpar increased back to home dose of 15 mg 3 times daily.   10.  Hypothyroidism Continue Synthroid.   11.  Morbid obesity -Lifestyle modification -Outpatient follow-up with PCP.  12.  Constipation -Continue current bowel regimen of MiraLAX daily, Senokot-S nightly.  13.  Confusion/altered mental status -Patient with bouts of waxing and waning confusion, per staff and PT patient with some bouts of visual hallucinations noted to okay yesterday and today.. -Patient currently afebrile, patient empirically on antibiotics not an infectious etiology. -Could likely be secondary to steroids. -Patient also noted on a decreased dose of home regimen of BuSpar currently at 5 mg 3 times daily and noted to have been on 15 mg 3 times daily. -Steroid rapidly tapered, BuSpar increased back to 15 mg 3 times daily.  Home meds.   -Clinical improvement.   -Supportive care.  14.  Hypernatremia -Sodium levels at 147, likely secondary to diuresis. -Hold torsemide today and if sodium level improves in the next 24 hours could  resume torsemide tomorrow.    DVT prophylaxis: SCDs Code Status: Full Family Communication: Updated patient.  Updated daughter via speaker phone. Disposition: Likely SNF when medically stable.  Status is: Inpatient The patient will require care spanning > 2 midnights and should be moved to inpatient because: Severity of illness   Consultants:  None  Procedures:  CT abdomen and pelvis 11/16/2022 Chest x-ray 11/16/2022 Transfusion 1 unit PRBCs 11/16/2022 Transfusion 1 unit PRBC 11/18/2022  Antimicrobials:  Anti-infectives (From admission, onward)    Start     Dose/Rate Route Frequency Ordered Stop   11/20/22 1015  cefdinir (OMNICEF)  capsule 300 mg        300 mg Oral Every 12 hours 11/20/22 0924 11/21/22 2211   11/17/22 2100  cefTRIAXone (ROCEPHIN) 2 g in sodium chloride 0.9 % 100 mL IVPB  Status:  Discontinued        2 g 200 mL/hr over 30 Minutes Intravenous Every 24 hours 11/17/22 1949 11/20/22 0924         Subjective: Patient sleeping but easily arousable.  Alert and oriented to self place and time.  Denies any hallucinations.  Confusion improved.  Denies any worsening shortness of breath.   Objective: Vitals:   11/22/22 2105 11/22/22 2113 11/23/22 0521 11/23/22 0822  BP:  (!) 162/55 (!) 167/44 (!) 149/46  Pulse:  (!) 58 (!) 58 60  Resp:  18 18 18   Temp:  97.7 F (36.5 C) 97.7 F (36.5 C) 97.8 F (36.6 C)  TempSrc:    Oral  SpO2: 96% 97% 90% 97%  Weight:      Height:        Intake/Output Summary (Last 24 hours) at 11/23/2022 1207 Last data filed at 11/23/2022 0522 Gross per 24 hour  Intake 240 ml  Output 1250 ml  Net -1010 ml   Filed Weights   11/19/22 0353 11/20/22 0500 11/22/22 0500  Weight: 105.2 kg 105.6 kg 105.8 kg    Examination:  General exam: NAD. Respiratory system: Decreased bibasilar crackles.  Some minimal expiratory wheezing.  Upper airway noise.  Fair air movement.  Speaking in full sentences.  No use of accessory muscles of respiration.  Cardiovascular system: RRR with 3/6 SEM right upper sternal border.  No significant pitting lower extremity edema.  Gastrointestinal system: Abdomen is soft, nontender, nondistended, positive bowel sounds.  No rebound.  No guarding.   Central nervous system: Alert and oriented x 3. No focal neurological deficits. Extremities: Symmetric 5 x 5 power. Skin: No rashes, lesions or ulcers Psychiatry: Judgement and insight appear fair. Mood & affect appropriate.     Data Reviewed: I have personally reviewed following labs and imaging studies  CBC: Recent Labs  Lab 11/16/22 1401 11/16/22 1524 11/19/22 0329 11/20/22 0452 11/21/22 0513  11/22/22 0457 11/23/22 0736  WBC 6.3   < > 9.2 7.6 6.0 6.1 6.7  NEUTROABS 4.8  --   --   --   --   --   --   HGB 7.2*   < > 9.3* 9.8* 9.6* 10.1* 11.4*  HCT 24.7*   < > 30.0* 31.9* 30.8* 32.3* 37.3  MCV 106.9*   < > 101.4* 100.9* 100.7* 100.9* 102.5*  PLT 197   < > 195 190 189 163 174   < > = values in this interval not displayed.    Basic Metabolic Panel: Recent Labs  Lab 11/18/22 0448 11/19/22 0329 11/20/22 0452 11/21/22 0513 11/22/22 0457 11/23/22 0736  NA 141 141 143 142  144 147*  K 4.1 3.8 4.2 3.5 3.4* 4.2  CL 95* 93* 94* 94* 98 97*  CO2 34* 34* 38* 35* 35* 40*  GLUCOSE 203* 278* 135* 174* 104* 92  BUN 32* 34* 46* 50* 46* 42*  CREATININE 1.28* 1.32* 1.24* 1.20* 1.21* 1.50*  CALCIUM 8.5* 8.3* 8.3* 8.1* 8.6* 8.7*  MG 2.7* 2.4  --   --   --  2.5*    GFR: Estimated Creatinine Clearance: 33.1 mL/min (A) (by C-G formula based on SCr of 1.5 mg/dL (H)).  Liver Function Tests: No results for input(s): "AST", "ALT", "ALKPHOS", "BILITOT", "PROT", "ALBUMIN" in the last 168 hours.  CBG: Recent Labs  Lab 11/22/22 1141 11/22/22 1652 11/22/22 2059 11/23/22 0824 11/23/22 1202  GLUCAP 158* 167* 187* 71 204*     No results found for this or any previous visit (from the past 240 hour(s)).       Radiology Studies: No results found.      Scheduled Meds:  amiodarone  200 mg Oral Daily   arformoterol  15 mcg Nebulization QHS   bisoprolol  2.5 mg Oral Daily   budesonide (PULMICORT) nebulizer solution  0.5 mg Nebulization BID   busPIRone  15 mg Oral TID   cholecalciferol  2,000 Units Oral Daily   cyanocobalamin  5,000 mcg Oral Daily   ezetimibe  10 mg Oral Daily   ferrous sulfate  325 mg Oral Daily   FLUoxetine  20 mg Oral BID   fluticasone  2 spray Each Nare Daily   influenza vaccine adjuvanted  0.5 mL Intramuscular Tomorrow-1000   insulin aspart  0-20 Units Subcutaneous TID WC   insulin aspart  0-5 Units Subcutaneous QHS   isosorbide mononitrate  15 mg Oral  Daily   levalbuterol  0.63 mg Nebulization BID   levothyroxine  125 mcg Oral Q0600   loratadine  10 mg Oral Daily   pantoprazole  40 mg Oral Q0600   polyethylene glycol  17 g Oral Daily   [START ON 11/24/2022] potassium chloride  10 mEq Oral Daily   predniSONE  20 mg Oral QAC breakfast   rosuvastatin  10 mg Oral QODAY   senna-docusate  1 tablet Oral QHS   [START ON 11/24/2022] torsemide  40 mg Oral Daily   Continuous Infusions:     LOS: 6 days    Time spent: 40 minutes    Ramiro Harvest, MD [facility]   To contact the attending provider between 7A-7P or the covering provider during after hours 7P-7A, please log into the web site [email or web address] and access using universal [place] password for that web site. If you do not have the password, please call the hospital operator.  11/23/2022, 12:07 PM

## 2022-11-23 NOTE — Plan of Care (Signed)
  Problem: Safety: Goal: Ability to remain free from injury will improve 11/23/2022 0255 by Vallery Ridge, RN Outcome: Progressing 11/23/2022 0254 by Vallery Ridge, RN Outcome: Progressing   Problem: Health Behavior/Discharge Planning: Goal: Ability to manage health-related needs will improve 11/23/2022 0255 by Vallery Ridge, RN Outcome: Progressing 11/23/2022 0254 by Vallery Ridge, RN Outcome: Progressing

## 2022-11-23 NOTE — Progress Notes (Signed)
Occupational Therapy Treatment Patient Details Name: Alexandria [name] MRN: [id number] DOB: [birth year] Today's Date: 11/23/2022   History of present illness 87 year old female history of COPD on home O2 3 L, HFpEF EF 60 to 65% 06/2022, CKD stage IIIb, diabetes, OSA not on CPAP, stage I NSCLC s/p radiation 2021, chronic iron deficiency anemia for several years with upper and lower endoscopy in 2017 showing hemorrhoids and diverticulosis without hemorrhage seen in the cancer center and sent to the ED due to anemia.  Patient presented with complaints of worsening shortness of breath than baseline as well as fatigue.   OT comments  Discussed with RN - pt awaiting lab results for COVID. Pt received in bed with visitor present. Pt overall improved with cognition; no visual hallucinations during session. Transfers with stand pivot EOB <> BSC for void, requires minA - CGA for standing balance for pericare. Generally unsteady on feet, cues for safety and sequencing. Pt reporting 7/10 pain in low back due to bed, OT offers sidelying or other positioning alternatives. Pt is a falls risk based on her CLOF. Pt left in bed, needs within reach and alarm activated. OT will continue to progress for functional gains.       If plan is discharge home, recommend the following:  A little help with walking and/or transfers;A little help with bathing/dressing/bathroom;Assistance with cooking/housework;Assist for transportation;Help with stairs or ramp for entrance;Direct supervision/assist for financial management;Direct supervision/assist for medications management   Equipment Recommendations  None recommended by OT    Recommendations for Other Services Other (comment)    Precautions / Restrictions Precautions Precautions: Fall Restrictions Weight Bearing Restrictions: No       Mobility Bed Mobility Overal bed mobility: Needs Assistance Bed Mobility: Supine to Sit     Supine to sit: Min assist     General  bed mobility comments: reaches for HHA from OT    Transfers Overall transfer level: Needs assistance Equipment used: 1 person hand held assist Transfers: Bed to chair/wheelchair/BSC Sit to Stand: Min assist, Contact guard assist Stand pivot transfers: Min assist               Balance Overall balance assessment: Needs assistance Sitting-balance support: Feet supported Sitting balance-[name]: Fair     Standing balance support: During functional activity Standing balance-[name]: Fair Standing balance comment: +1 min assist for general safety, would be unsafe to walk unassisted                           ADL either performed or assessed with clinical judgement   ADL Overall ADL's : Needs assistance/impaired                         Toilet Transfer: Minimal assistance Toilet Transfer Details (indicate cue type and reason): Stand pivot from EOB to [facility] Toileting- Clothing Manipulation and Hygiene: Minimal assistance;Sit to/from stand       Functional mobility during ADLs: Cueing for safety;Cueing for sequencing General ADL Comments: Pt overall improved with cognition; no visual hallucinations during session. Transfers with stand pivot EOB <> BSC for void, requires minA - CGA for standing balance for pericare. Generally unsteady on feet, cues for safety and sequencing.      Cognition Arousal: Alert Behavior During Therapy: WFL for tasks assessed/performed Overall Cognitive Status: Impaired/Different from baseline  Exercises Other Exercises Other Exercises: to [facility] to void            Pertinent Vitals/ Pain       Pain Assessment Pain Assessment: No/denies pain   Frequency  Min 1X/week        Progress Toward Goals  OT Goals(current goals can now be found in the care plan section)  Progress towards OT goals: Progressing toward goals  Acute Rehab OT Goals OT Goal  Formulation: With patient Time For Goal Achievement: 12/02/22 Potential to Achieve Goals: Fair  Plan         AM-PAC OT "6 Clicks" Daily Activity     Outcome Measure   Help from another person eating meals?: None Help from another person taking care of personal grooming?: A Little Help from another person toileting, which includes using toliet, bedpan, or urinal?: A Little Help from another person bathing (including washing, rinsing, drying)?: A Little Help from another person to put on and taking off regular upper body clothing?: None Help from another person to put on and taking off regular lower body clothing?: A Little 6 Click Score: 20    End of Session Equipment Utilized During Treatment: Oxygen  OT Visit Diagnosis: Unsteadiness on feet (R26.81);Muscle weakness (generalized) (M62.81);History of falling (Z91.81)   Activity Tolerance Patient tolerated treatment well   Patient Left in bed;with call bell/phone within reach;with bed alarm set;with family/visitor present   Nurse Communication Other (comment) (pain level in low back)        Time: 4696-2952 OT Time Calculation (min): 23 min  Charges: OT General Charges $OT Visit: 1 Visit OT Treatments $Self Care/Home Management : 23-37 mins  Alexandria [name], OTR/L  11/23/22, 2:59 PM

## 2022-11-24 DIAGNOSIS — D62 Acute posthemorrhagic anemia: Secondary | ICD-10-CM | POA: Diagnosis not present

## 2022-11-24 LAB — GLUCOSE, CAPILLARY
Glucose-Capillary: 85 mg/dL (ref 70–99)
Glucose-Capillary: 96 mg/dL (ref 70–99)
Glucose-Capillary: 162 mg/dL — ABNORMAL HIGH (ref 70–99)
Glucose-Capillary: 262 mg/dL — ABNORMAL HIGH (ref 70–99)
Glucose-Capillary: 230 mg/dL — ABNORMAL HIGH (ref 70–99)

## 2022-11-24 NOTE — Plan of Care (Signed)

## 2022-11-24 NOTE — Progress Notes (Signed)
Physical Therapy Treatment Patient Details Name: Alexandria [name] MRN: [id number] DOB: 02/11/1936 Today's Date: 11/24/2022   History of Present Illness 87 year old female history of COPD on home O2 3 L, HFpEF EF 60 to 65% 06/2022, CKD stage IIIb, diabetes, OSA not on CPAP, stage I NSCLC s/p radiation 2021, chronic iron deficiency anemia for several years with upper and lower endoscopy in 2017 showing hemorrhoids and diverticulosis without hemorrhage seen in the cancer center and sent to the ED due to anemia.  Patient presented with complaints of worsening shortness of breath than baseline as well as fatigue.    PT Comments  Pt is steadily progressing towards PT goals. Pt is received sitting EOB, she is agreeable to PT session. Pt performs transfers and amb CGA with 2L [place] and using RW. Pt performed 5xSTS in 43.26 sec with use of RW indicating high fall risk. At end of testing, Pt reports fatigue of 6/10 on modified RPE. Throughout mobility, Pt was able to maintain SpO2 >94%. Pt performed cognitive task during amb which impacted gait speed and Pt required increased time to verbalize numbers and days of the week backwards. Overall, Pt tolerated treatment well today but cont to require rest breaks and increased time to complete task. Pt would benefit from cont skilled PT to address above deficits and promote optimal return to PLOF.     If plan is discharge home, recommend the following: A little help with walking and/or transfers;A little help with bathing/dressing/bathroom;Assistance with cooking/housework;Assist for transportation   Can travel by private vehicle     No  Equipment Recommendations  None recommended by PT    Recommendations for Other Services       Precautions / Restrictions Precautions Precautions: Fall Restrictions Weight Bearing Restrictions: No     Mobility  Bed Mobility Overal bed mobility: Needs Assistance             General bed mobility comments: Not able to  assess due to Pt received sitting EOB    Transfers Overall transfer level: Needs assistance Equipment used: Rolling walker (2 wheels) Transfers: Sit to/from Stand Sit to Stand: Contact guard assist, From elevated surface           General transfer comment: Pt able to perform 5xSTS with results of 43.26 sec with use of RW indicating fall risk; Per Pt's request, she wanted to attempt STS with no assistance    Ambulation/Gait Ambulation/Gait assistance: Contact guard assist Gait Distance (Feet): 50 Feet Assistive device: Rolling walker (2 wheels) Gait Pattern/deviations: Step-through pattern, Decreased stride length, Trunk flexed Gait velocity: decreased     General Gait Details: Slight improved gait mechanics; performs amb with cog task (verbalizing color pallets, days of the week backwards) influencing gait speed   Stairs             Wheelchair Mobility     Tilt Bed    Modified Rankin (Stroke Patients Only)       Balance Overall balance assessment: Needs assistance Sitting-balance support: Feet supported, Single extremity supported Sitting balance-[name]: Good Sitting balance - Comments: Able to maintain seated EOB balance with 1 UE support during functional tasks   Standing balance support: During functional activity, Reliant on assistive device for balance, Bilateral upper extremity supported Standing balance-[name]: Fair Standing balance comment: Able to maintain static standing balance during functional tasks with brief occasion of 1 UE support during line management.  Cognition Arousal: Alert Behavior During Therapy: WFL for tasks assessed/performed Overall Cognitive Status: Within Functional Limits for tasks assessed                                 General Comments: AO x4; Pleasant and willing to work with PT        Exercises      General Comments General comments (skin integrity,  edema, etc.): On 2L [place] throughout session, able to maintain SpO2 >94% throughout mobility      Pertinent Vitals/Pain Pain Assessment Pain Assessment: 0-10 Pain Score: 8  Pain Location: LBP Pain Descriptors / Indicators: Aching, Discomfort, Dull Pain Intervention(s): Limited activity within patient's tolerance, Monitored during session    Home Living                          Prior Function            PT Goals (current goals can now be found in the care plan section) Acute Rehab PT Goals Patient Stated Goal: get strong enough to go back home PT Goal Formulation: With patient/family Time For Goal Achievement: 12/02/22 Potential to Achieve Goals: Good Progress towards PT goals: Progressing toward goals    Frequency    Min 1X/week      PT Plan      Co-evaluation              AM-PAC PT "6 Clicks" Mobility   Outcome Measure  Help needed turning from your back to your side while in a flat bed without using bedrails?: A Little Help needed moving from lying on your back to sitting on the side of a flat bed without using bedrails?: A Little Help needed moving to and from a bed to a chair (including a wheelchair)?: A Little Help needed standing up from a chair using your arms (e.g., wheelchair or bedside chair)?: A Little Help needed to walk in hospital room?: A Little Help needed climbing 3-5 steps with a railing? : A Lot 6 Click Score: 17    End of Session Equipment Utilized During Treatment: Gait belt Activity Tolerance: Patient tolerated treatment well Patient left: in chair;with call bell/phone within reach [facility]) Nurse Communication: Mobility status PT Visit Diagnosis: Unsteadiness on feet (R26.81);Muscle weakness (generalized) (M62.81);Difficulty in walking, not elsewhere classified (R26.2)     Time: 4403-4742 PT Time Calculation (min) (ACUTE ONLY): 16 min  Charges:                            Elmon Else, SPT    Atoya Andrew 11/24/2022, 11:51 AM

## 2022-11-24 NOTE — Progress Notes (Signed)
Mobility Specialist - Progress Note  Post-mobility: HR 56, SPO2 90   11/24/22 1530  Mobility  Activity Ambulated with assistance in hallway  Level of Assistance Standby assist, set-up cues, supervision of patient - no hands on  Assistive Device Front wheel walker  Distance Ambulated (ft) 40 ft  $Mobility charge 1 Mobility  Mobility Specialist Start Time (ACUTE ONLY) 1430  Mobility Specialist Stop Time (ACUTE ONLY) 1446  Mobility Specialist Time Calculation (min) (ACUTE ONLY) 16 min   Pt supine upon entry, utilizing [place]. Pt completed bed mob MinA for BLE and STS to RW MinG. Pt amb 40 ft in the hallway MinG-SBA, O2 remained 90% or above. Pt returned to the room, left seated on the [facility] with call bell in reach. RN notified.  Zetta Bills Mobility Specialist 11/24/22 3:37 PM

## 2022-11-24 NOTE — Progress Notes (Signed)
PULMONOLOGY         Date: 11/24/2022,   MRN# [id number] Alexandria [name] 1935/05/26     AdmissionWeight: 105.7 kg                 CurrentWeight: 115 kg  Referring provider: Dr Charlesetta Shanks   CHIEF COMPLAINT:   Acute on chronic hypoxemic hypercarbic respiratory failure   HISTORY OF PRESENT ILLNESS   This is an 87 yo F with hx of chronic anxiety disorder, COPD with chronic hypoxemia requiring 2-3L/min [place] supplemental O2, morbid obesity and episodes of hypercapnic encephalopathy.  She had admission for AECOPD with hypercapnia and required BIPAP ventilation for treatment in 2024.  She is accompanied by daughter who shares similar encephalopathy was present on prior hospitalization.  Her peripheral blood BMP shows bicarb chronically compensated with HCO3>40.  I was unable to obtain interview from patient due to encephalopathy. She had CXR today with no pulmonary edema or pneumonia noted.   11/24/22- patient seen at bedside, stable vital signs on 2L/min [place]. She had ABG with chronic hypercapnic resp failure.  We attempted to use CPAP overnight but patient declined. Viral workup is negative. CBC and BMP performed with improved metabolic alkalosis and AKI. Encephalopathy is improved. She is now on home setting of 2L/min.  I called daughter and we reviewed medical plan.    PAST MEDICAL HISTORY   Past Medical History:  Diagnosis Date   Allergy, unspecified not elsewhere classified    Anxiety    Aortic stenosis, mild 03/30/2022   a. TTE 03/29/2022: EF 55-60, no RWMA, GR 1 DD, normal RVSF, mild aortic stenosis (mean 10, V-max 212 cm/s, DI 0.70)   Cataract    Chronic diastolic congestive heart failure (HCC)    a. 06/2022 Echo: EF 60-65%, no rwma, nl RV fxn, mild MR, mild AS.   COPD (chronic obstructive pulmonary disease) (HCC) 03/08/1998   PFTs 12/12/2002 FEV 1 1.42 (64%) ratio 58 with no better after B2 and DLCO75%; PFTs 11/20/09 FEV1 1.50 (73%) ratio 50 no better after B2 with DLCO 62%; Hfa  50% 11/20/2009 >75%, 01/13/10 p coaching   Coronary artery calcification seen on CT scan    a. 2012 Neg stress test.   Depression 12/07/1998   Diabetes mellitus type II 02/05/2006   Dr. Elvera Lennox with endo   Disorders of bursae and tendons in shoulder region, unspecified    Rotator cuff syndrome, right   E. coli bacteremia    Esophagitis    GERD (gastroesophageal reflux disease)    History of UTI    Hyperlipidemia 10/04/2000   Hypertension 03/08/1992   Hypothyroidism 03/08/1968   Iron deficiency anemia    Lung nodule    radiation starts 10-08-2019   Malignant neoplasm of right upper lobe of lung (HCC) 07/24/2019   Obesity    NOS   OSA (obstructive sleep apnea)    PSG 01/27/10 AHI 13, pt does not know CPAP settings   PAF (paroxysmal atrial fibrillation) (HCC)    a. 06/2022 in setting of UTI/encephalopathy-->converted on amio; b. CHA2DS2VASc = 6-->No OAC 2/2 chronic anemia/fall risk; c. 06/2022 Zio: Predominantly sinus bradycardia at 54 (3-158)'s.  1 brief run of SVT.  2.2% PVC burden.   Peripheral neuropathy    Likely due to DM per Dr. Tresa Endo hernia      SURGICAL HISTORY   Past Surgical History:  Procedure Laterality Date   ABD U/S  03/19/1999   Nml x2 foci in liver  ADENOSINE MYOVIEW  06/02/2007   Nml   CARDIOLITE PERSANTINE  08/24/2000   Nml   CAROTID U/S  08/24/2000   1-39% ICA stenosis   CAROTID U/S  06/02/2007   No apprec change    CARPAL TUNNEL RELEASE  12/1997   Right   CESAREAN SECTION     x2 [place]/ repeat   CHOLECYSTECTOMY  1997   COLONOSCOPY WITH PROPOFOL N/A 02/12/2016   Procedure: COLONOSCOPY WITH PROPOFOL;  Surgeon: Rachael Fee, MD;  Location: [facility];  Service: Endoscopy;  Laterality: N/A;   CT ABD W & PELVIS WO/W CM     Abd hemangiomas of liver, 1 cm R renal cyst   DENTAL SURGERY  2016   Implants   DEXA  07/03/2003   Nml   ESOPHAGOGASTRODUODENOSCOPY  12/05/1997   Nml (due to hoarseness)   ESOPHAGOGASTRODUODENOSCOPY (EGD) WITH  PROPOFOL N/A 02/12/2016   Procedure: ESOPHAGOGASTRODUODENOSCOPY (EGD) WITH PROPOFOL;  Surgeon: Rachael Fee, MD;  Location: [facility];  Service: Endoscopy;  Laterality: N/A;   GALLBLADDER SURGERY     HERNIA REPAIR  01/24/2009   Lap Ventr w/ Lysis of adhesions (Dr. Dwain Sarna)   knee arthroscopic surgery  years ago   right   ROTATOR CUFF REPAIR  1984   Right, [place]   SHOULDER OPEN ROTATOR CUFF REPAIR  02/08/2012   Procedure: ROTATOR CUFF REPAIR SHOULDER OPEN;  Surgeon: Drucilla Schmidt, MD;  Location: [facility];  Service: Orthopedics;  Laterality: Left;  Left Shoulder Open Anterior Acrominectomy Rotator Cuff Repair Open Distal Clavicle Resection ,tissue mend graft, and repair of biceps tendon   THUMB RELEASE  12/1997   Right   TONSILLECTOMY     TOTAL ABDOMINAL HYSTERECTOMY  1985   Due to dysmennorhea   US ECHOCARDIOGRAPHY  06/02/2007     FAMILY HISTORY   Family History  Problem Relation Age of Onset   Heart disease Mother    Thyroid disease Mother    Emphysema Father        One lung   Cystic fibrosis Sister    Hyperthyroidism Sister    Osteoarthritis Brother    Hyperthyroidism Brother    Esophageal cancer Neg Hx    Cancer Neg Hx        Head or neck   Colon cancer Neg Hx    Stomach cancer Neg Hx    Breast cancer Neg Hx      SOCIAL HISTORY   Social History   Tobacco Use   Smoking status: Former    Current packs/day: 0.00    Average packs/day: 2.0 packs/day for 50.0 years (100.0 ttl pk-yrs)    Types: Cigarettes    Start date: 02/06/1955    Quit date: 02/05/2005    Years since quitting: 17.8   Smokeless tobacco: Never  Vaping Use   Vaping status: Never Used  Substance Use Topics   Alcohol use: Yes    Comment: occasionally   Drug use: Never     MEDICATIONS    Home Medication:    Current Medication:  Current Facility-Administered Medications:    0.9 %  sodium chloride infusion, , Intravenous, PRN, Rodolph Bong, MD, Last Rate: 10 mL/hr at  11/23/22 1507, New Bag at 11/23/22 1507   acetaminophen (TYLENOL) tablet 650 mg, 650 mg, Oral, Q6H PRN, 650 mg at 11/23/22 2238 **OR** acetaminophen (TYLENOL) suppository 650 mg, 650 mg, Rectal, Q6H PRN, Andris Baumann, MD   amiodarone (PACERONE) tablet 200 mg, 200 mg, Oral, Daily, Rodolph Bong,  MD, 200 mg at 11/23/22 1121   arformoterol (BROVANA) nebulizer solution 15 mcg, 15 mcg, Nebulization, QHS, Rodolph Bong, MD, 15 mcg at 11/23/22 1938   bisoprolol (ZEBETA) tablet 2.5 mg, 2.5 mg, Oral, Daily, Lindajo Royal V, MD, 2.5 mg at 11/23/22 1122   budesonide (PULMICORT) nebulizer solution 0.5 mg, 0.5 mg, Nebulization, BID, Rodolph Bong, MD, 0.5 mg at 11/23/22 1938   busPIRone (BUSPAR) tablet 15 mg, 15 mg, Oral, TID, Rodolph Bong, MD, 15 mg at 11/23/22 2239   cholecalciferol (VITAMIN D3) 25 MCG (1000 UNIT) tablet 2,000 Units, 2,000 Units, Oral, Daily, Andris Baumann, MD, 2,000 Units at 11/23/22 1120   cyanocobalamin (VITAMIN B12) tablet 5,000 mcg, 5,000 mcg, Oral, Daily, Lindajo Royal V, MD, 5,000 mcg at 11/23/22 1120   ezetimibe (ZETIA) tablet 10 mg, 10 mg, Oral, Daily, Rodolph Bong, MD, 10 mg at 11/23/22 1123   ferrous sulfate tablet 325 mg, 325 mg, Oral, Daily, Lindajo Royal V, MD, 325 mg at 11/23/22 1121   FLUoxetine (PROZAC) capsule 20 mg, 20 mg, Oral, BID, Rodolph Bong, MD, 20 mg at 11/23/22 2240   fluticasone (FLONASE) 50 MCG/ACT nasal spray 2 spray, 2 spray, Each Nare, Daily, Rodolph Bong, MD, 2 spray at 11/23/22 1202   influenza vaccine adjuvanted (FLUAD) injection 0.5 mL, 0.5 mL, Intramuscular, Tomorrow-1000, Rodolph Bong, MD   insulin aspart (novoLOG) injection 0-20 Units, 0-20 Units, Subcutaneous, TID WC, Andris Baumann, MD, 4 Units at 11/23/22 1835   insulin aspart (novoLOG) injection 0-5 Units, 0-5 Units, Subcutaneous, QHS, Andris Baumann, MD, 3 Units at 11/23/22 2252   isosorbide mononitrate (IMDUR) 24 hr tablet 15 mg, 15 mg, Oral, Daily,  Rodolph Bong, MD, 15 mg at 11/23/22 1121   levalbuterol (XOPENEX) nebulizer solution 0.63 mg, 0.63 mg, Nebulization, Q2H PRN, Rodolph Bong, MD   levalbuterol Pauline Aus) nebulizer solution 0.63 mg, 0.63 mg, Nebulization, BID, Rodolph Bong, MD, 0.63 mg at 11/23/22 1938   Levofloxacin (LEVAQUIN) IVPB 250 mg, 250 mg, Intravenous, Q24H, Rodolph Bong, MD, Last Rate: 50 mL/hr at 11/23/22 1513, 250 mg at 11/23/22 1513   levothyroxine (SYNTHROID) tablet 125 mcg, 125 mcg, Oral, Q0600, Andris Baumann, MD, 125 mcg at 11/24/22 0606   loratadine (CLARITIN) tablet 10 mg, 10 mg, Oral, Daily, Rodolph Bong, MD, 10 mg at 11/23/22 1128   nitroGLYCERIN (NITROSTAT) SL tablet 0.4 mg, 0.4 mg, Sublingual, Q5 min PRN, Andris Baumann, MD   ondansetron [facility]) tablet 4 mg, 4 mg, Oral, Q6H PRN **OR** ondansetron (ZOFRAN) injection 4 mg, 4 mg, Intravenous, Q6H PRN, Andris Baumann, MD   Oral care mouth rinse, 15 mL, Mouth Rinse, PRN, Rodolph Bong, MD   pantoprazole (PROTONIX) EC tablet 40 mg, 40 mg, Oral, Q0600, Rodolph Bong, MD, 40 mg at 11/24/22 0606   polyethylene glycol (MIRALAX / GLYCOLAX) packet 17 g, 17 g, Oral, Daily, Rodolph Bong, MD, 17 g at 11/23/22 1127   potassium chloride (KLOR-CON M) CR tablet 10 mEq, 10 mEq, Oral, Daily, Rodolph Bong, MD   predniSONE (DELTASONE) tablet 20 mg, 20 mg, Oral, QAC breakfast, Rodolph Bong, MD, 20 mg at 11/23/22 1121   rosuvastatin (CRESTOR) tablet 10 mg, 10 mg, Oral, QODAY, Andris Baumann, MD, 10 mg at 11/22/22 0933   senna-docusate (Senokot-S) tablet 1 tablet, 1 tablet, Oral, QHS, Rodolph Bong, MD, 1 tablet at 11/23/22 2238  Facility-Administered Medications Ordered in Other Encounters:  0.9 %  sodium chloride infusion, , Intravenous, Once, Brahmanday, Govinda R, MD   0.9 %  sodium chloride infusion, , Intravenous, Continuous, Earna Coder, MD, Last Rate: 20 mL/hr at 11/02/22 1421, New Bag at 11/02/22  1421    ALLERGIES   Antihistamines, diphenhydramine-type; Incruse ellipta [umeclidinium bromide]; Clarithromycin; Codeine; Diphenhydramine; Pregabalin; Sulfonamide derivatives; Tiotropium bromide monohydrate; Umeclidinium; and Vraylar [cariprazine]     REVIEW OF SYSTEMS    Review of Systems:  Gen:  Denies  fever, sweats, chills weigh loss  HEENT: Denies blurred vision, double vision, ear pain, eye pain, hearing loss, nose bleeds, sore throat Cardiac:  No dizziness, chest pain or heaviness, chest tightness,edema Resp:   reports dyspnea chronically  Gi: Denies swallowing difficulty, stomach pain, nausea or vomiting, diarrhea, constipation, bowel incontinence Gu:  Denies bladder incontinence, burning urine Ext:   Denies Joint pain, stiffness or swelling Skin: Denies  skin rash, easy bruising or bleeding or hives Endoc:  Denies polyuria, polydipsia , polyphagia or weight change Psych:   Denies depression, insomnia or hallucinations   Other:  All other systems negative   VS: BP (!) 167/96 (BP Location: Left Arm)   Pulse (!) 55   Temp (!) 97.4 F (36.3 C) (Oral)   Resp 20   Ht 5\' 5"  (1.651 m)   Wt 115 kg   LMP  (LMP Unknown)   SpO2 94%   BMI 42.19 kg/m      PHYSICAL EXAM    GENERAL:NAD, no fevers, chills, no weakness no fatigue HEAD: Normocephalic, atraumatic.  EYES: Pupils equal, round, reactive to light. Extraocular muscles intact. No scleral icterus.  MOUTH: Moist mucosal membrane. Dentition intact. No abscess noted.  EAR, NOSE, THROAT: Clear without exudates. No external lesions.  NECK: Supple. No thyromegaly. No nodules. No JVD.  PULMONARY: decreased breath sounds with mild rhonchi worse at bases bilaterally.  CARDIOVASCULAR: S1 and S2. Regular rate and rhythm. No murmurs, rubs, or gallops. No edema. Pedal pulses 2+ bilaterally.  GASTROINTESTINAL: Soft, nontender, nondistended. No masses. Positive bowel sounds. No hepatosplenomegaly.  MUSCULOSKELETAL: No  swelling, clubbing, or edema. Range of motion full in all extremities.  NEUROLOGIC: Cranial nerves II through XII are intact. No gross focal neurological deficits. Sensation intact. Reflexes intact.  SKIN: No ulceration, lesions, rashes, or cyanosis. Skin warm and dry. Turgor intact.  PSYCHIATRIC: Mood, affect within normal limits. The patient is awake, alert and oriented x 3. Insight, judgment intact.       IMAGING  Reivewed CXR from today  ASSESSMENT/PLAN   Acute on chronic hypoxemic hypercarbic respiratory failure    Due to Acute COPD exacerbation with hypoventilation pickwikian overlap -patient is on steroids and antimicrobials -patient is already receiving supplemental O2 with goal spO2 89-94% -ABG to evaluate need for BIPAP support with hypercapnia -RVP to rule out viral etiology of current exacerbation -CRP for background inflammation level in context of AECOPD -patient normally sees pulmonary with Dr Craige Cotta.  -will continue to follow along with you -we had attempted to use CPAP but patient declined.  We discussed this and she states she will try again.  She may have OSA COPD overlap and may need to have PSG on outpatient for evaluation of hypoventilation and OSA.            Thank you for allowing me to participate in the care of this patient.   Patient/Family are satisfied with care plan and all questions have been answered.    Provider disclosure: Patient with at least one  acute or chronic illness or injury that poses a threat to life or bodily function and is being managed actively during this encounter.  All of the below services have been performed independently by signing provider:  review of prior documentation from internal and or external health records.  Review of previous and current lab results.  Interview and comprehensive assessment during patient visit today. Review of current and previous chest radiographs/CT scans. Discussion of management and test  interpretation with health care team and patient/family.   This document was prepared using Dragon voice recognition software and may include unintentional dictation errors.     Vida Rigger, M.D.  Division of Pulmonary & Critical Care Medicine

## 2022-11-24 NOTE — Inpatient Diabetes Management (Addendum)
Inpatient Diabetes Program Recommendations  AACE/ADA: New Consensus Statement on Inpatient Glycemic Control (2015)  Target Ranges:  Prepandial:   less than 140 mg/dL      Peak postprandial:   less than 180 mg/dL (1-2 hours)      Critically ill patients:  140 - 180 mg/dL   Lab Results  Component Value Date   GLUCAP 96 11/24/2022   HGBA1C 5.3 11/18/2022    Review of Glycemic Control  Latest Reference Range & Units 11/23/22 08:24 11/23/22 12:02 11/23/22 17:06 11/23/22 21:28 11/23/22 22:47 11/24/22 07:47 11/24/22 08:25  Glucose-Capillary 70 - 99 mg/dL 71 324 (H) 401 (H) 027 (H) 258 (H) 85 96  (H): Data is abnormally high  Diabetes history: DM2 Outpatient Diabetes medications: Metformin 500 mg QPM Current orders for Inpatient glycemic control: Novolog 0-20 units TID with meals, Novolog 0-5 units at bedtime; Prednisone 20 mg QD  Inpatient Diabetes Program Recommendations:    Postprandials elevated with steroids.  Might consider:  Novolog 3 units TID with meals if she eats at least 50% and glucose is > 80 mg/dL.  Will continue to follow while inpatient.  Thank you, Dulce Sellar, MSN, CDCES Diabetes Coordinator Inpatient Diabetes Program [phone] (team pager from 8a-5p)

## 2022-11-24 NOTE — Plan of Care (Signed)
  Problem: Education: Goal: Knowledge of General Education information will improve Description: Including pain rating scale, medication(s)/side effects and non-pharmacologic comfort measures Outcome: Progressing   Problem: Elimination: Goal: Will not experience complications related to urinary retention Outcome: Progressing   Problem: Tissue Perfusion: Goal: Adequacy of tissue perfusion will improve Outcome: Progressing

## 2022-11-24 NOTE — Progress Notes (Signed)
Set up patient on CPAP machine with a nasal mask. Patient tried for about 5 minutes and wanted the mask off. Pt stated "it was too noisy and she wouldn't be able to sleep with it on her face."

## 2022-11-24 NOTE — TOC Progression Note (Signed)
Transition of Care [facility]) - Progression Note    Patient Details  Name: Alexandria [name] MRN: [id number] Date of Birth: 07/01/1935  [facility]) CM/SW Contact  Darolyn Rua, [place] Phone Number: 11/24/2022, 10:40 AM  Clinical Narrative:     CSW spoke with daughter [name] who reports being agreeable to SNF and is agreeable to expanded bed search as she is aware [facility] still does not have any beds.   She is interested in [facility], referral sent.   Pending bed offers at this time .  Expected Discharge Plan: Skilled Nursing Facility    Expected Discharge Plan and Services                                               Social Determinants of Health (SDOH) Interventions SDOH Screenings   Food Insecurity: No Food Insecurity (11/17/2022)  Housing: Low Risk  (11/17/2022)  Transportation Needs: No Transportation Needs (11/17/2022)  Utilities: Not At Risk (11/17/2022)  Alcohol Screen: Low Risk  (10/21/2022)  Depression (PHQ2-9): Low Risk  (10/21/2022)  Recent Concern: Depression (PHQ2-9) - Medium Risk (08/03/2022)  Financial Resource Strain: Low Risk  (10/21/2022)  Physical Activity: Insufficiently Active (10/21/2022)  Social Connections: Socially Isolated (10/21/2022)  Stress: No Stress Concern Present (10/21/2022)  Tobacco Use: Medium Risk (11/20/2022)  Health Literacy: Adequate Health Literacy (10/21/2022)    Readmission Risk Interventions    11/18/2022    4:47 PM  Readmission Risk Prevention Plan  Transportation Screening Complete  Medication Review (RN Care Manager) Complete  HRI or Home Care Consult Complete  SW Recovery Care/Counseling Consult Complete  Palliative Care Screening Not Applicable  Skilled Nursing Facility Complete

## 2022-11-24 NOTE — Progress Notes (Signed)
[facility]  - [place] [facility]   PATIENT NAME: Alexandria [name]    MR#:  [id number]  DATE OF BIRTH:  1935/08/03  SUBJECTIVE:  family friend at bedside. Patient laying in bed. Tells me she is having a better day today. Walked with PT about 50 feet. Breathing much improved. She agrees to use CPAP tonight.    VITALS:  Blood pressure (!) 114/52, pulse (!) 53, temperature 98.2 F (36.8 C), temperature source Oral, resp. rate 20, height 5\' 5"  (1.651 m), weight 115 kg, SpO2 100%.  PHYSICAL EXAMINATION:   GENERAL:  87 y.o.-year-old patient with no acute distress. Morbidly obese LUNGS: Normal breath sounds bilaterally CARDIOVASCULAR: S1, S2 normal. No murmur   ABDOMEN: Soft, nontender, nondistended.   EXTREMITIES:+  edema b/l.    NEUROLOGIC: nonfocal  patient is alert and awake SKIN:  per RN   LABORATORY PANEL:  CBC Recent Labs  Lab 11/24/22 0606  WBC 7.5  HGB 11.0*  HCT 36.4  PLT 148*    Chemistries  Recent Labs  Lab 11/24/22 0606  NA 143  K 3.9  CL 97*  CO2 33*  GLUCOSE 83  BUN 36*  CREATININE 1.23*  CALCIUM 8.2*  MG 2.7*   Cardiac Enzymes No results for input(s): "TROPONINI" in the last 168 hours. RADIOLOGY:  DG Chest 2 View  Result Date: 11/23/2022 CLINICAL DATA:  Shortness of breath. EXAM: CHEST - 2 VIEW COMPARISON:  November 16, 2022. FINDINGS: Stable cardiomegaly. No acute pulmonary disease is noted. Bony thorax is unremarkable. IMPRESSION: No active cardiopulmonary disease. Aortic Atherosclerosis (ICD10-I70.0). Electronically Signed   By: Lupita Raider M.D.   On: 11/23/2022 15:34    Assessment and Plan   87 year old female history of COPD on home O2 3 L, HFpEF EF 60 to 65% 06/2022, CKD stage IIIb, diabetes, OSA not on CPAP, stage I NSCLC s/p radiation 2021, chronic iron deficiency anemia for several years with upper and lower endoscopy in 2017 showing hemorrhoids and diverticulosis without hemorrhage seen in the cancer center and sent  to the ED due to anemia  CT abdomen and pelvis done on admission with extensive colonic diverticulosis.   Acute on chronic respiratory failure hypoxic/hyper cardiac due to COPD, obstructive sleep apnea, acute on chronic diastolic CHF mild history of stage I non-small cell lung cancer status post radiation 2021 -- patient currently on 2 L nasal cannula oxygen. Sats study are 95-hundred percent -- feeling overall better --course of antibiotic --x-ray repeat does not show any abnormality. -- PO steroid for two more days -- continue CPAP at nighttime-- per Dr.Aleskeerov  Acute on chronic anemia iron deficiency -- patient has had G.I. workup done in the past. At present does not wish to pursue any workup per records -- status post two unit blood transfusion during this hospitalization -- received IV iron -- hemoglobin is 11.0  Acute on chronic diastolic heart failure -- patient had some weight gain -- echo reviewed -- urine output monitored -- will resume home dose of torsemide  Hypertension -- continue imdur and bisoprolol  PAF --Patient noted to have new onset A-fib in 2024 that converted with IV amiodarone. -Not a anticoagulation candidate in the setting of anemia and high fall risk. -Continue home regimen amiodarone.  Heart rate ranging from the 50s to the 70s on low-dose bisoprolol.  -Monitor heart rate with bisoprolol.    CKD stage IIIb -Stable.   Depression -Continue home regimen of Prozac.   -BuSpar increased back to home  dose of 15 mg 3 times daily.     Hypothyroidism Continue Synthroid.    Morbid obesity/?OSA -Lifestyle modification -Outpatient follow-up with PCP. --pt will need formal PSG as out pt with Dr Craige Cotta --CPA qhs     Procedures: Family communication :tanya Consults :pulm CODE STATUS: full DVT Prophylaxis : Level of care: Telemetry Medical Status is: Inpatient Remains inpatient appropriate because: awaiting Rehab bed. Medically improving     TOTAL TIME TAKING CARE OF THIS PATIENT: 35 minutes.  >50% time spent on counselling and coordination of care  Note: This dictation was prepared with Dragon dictation along with smaller phrase technology. Any transcriptional errors that result from this process are unintentional.  Enedina Finner M.D    [facility]   CC: Primary care physician; Joaquim Nam, MD

## 2022-11-25 DIAGNOSIS — D62 Acute posthemorrhagic anemia: Secondary | ICD-10-CM | POA: Diagnosis not present

## 2022-11-25 LAB — GLUCOSE, CAPILLARY
Glucose-Capillary: 65 mg/dL — ABNORMAL LOW (ref 70–99)
Glucose-Capillary: 104 mg/dL — ABNORMAL HIGH (ref 70–99)
Glucose-Capillary: 43 mg/dL — CL (ref 70–99)
Glucose-Capillary: 89 mg/dL (ref 70–99)
Glucose-Capillary: 162 mg/dL — ABNORMAL HIGH (ref 70–99)
Glucose-Capillary: 178 mg/dL — ABNORMAL HIGH (ref 70–99)
Glucose-Capillary: 332 mg/dL — ABNORMAL HIGH (ref 70–99)

## 2022-11-25 MED ORDER — INSULIN ASPART 100 UNIT/ML IJ SOLN: 0.0000 [IU] | Freq: Every day | INTRAMUSCULAR | Status: DC

## 2022-11-25 MED ORDER — INSULIN ASPART 100 UNIT/ML IJ SOLN: 0.0000 [IU] | Freq: Three times a day (TID) | INTRAMUSCULAR | Status: DC

## 2022-11-25 MED ORDER — DEXTROSE 50 % IV SOLN: 25.0000 mL | Freq: Once | INTRAVENOUS | Status: DC | PRN

## 2022-11-25 MED ORDER — DEXTROSE 50 % IV SOLN: 25.0000 mL | Freq: Once | INTRAVENOUS | Status: DC

## 2022-11-25 MED ORDER — TORSEMIDE 20 MG PO TABS
40.0000 mg | ORAL_TABLET | Freq: Every day | ORAL | Status: DC
  Administered 2022-11-25 – 2022-11-26 (×2): 40 mg via ORAL
  Filled 2022-11-25 (×2): qty 2

## 2022-11-25 NOTE — Progress Notes (Signed)
Placed patient on CPAP of 5 with 2LPM bleed in for the night.

## 2022-11-25 NOTE — Care Management Important Message (Signed)
Important Message  Patient Details  Name: Alexandria [name] MRN: [id number] Date of Birth: [birth year]   Medicare Important Message Given:  Yes     Johnell Comings 11/25/2022, 1:18 PM

## 2022-11-25 NOTE — Progress Notes (Signed)
PULMONOLOGY         Date: 11/25/2022,   MRN# [id number] Alexandria [name] September 09, 1935     AdmissionWeight: 105.7 kg                 CurrentWeight: 115 kg  Referring provider: Dr Alexandria [name]   CHIEF COMPLAINT:   Acute on chronic hypoxemic hypercarbic respiratory failure   HISTORY OF PRESENT ILLNESS   This is an 87 yo F with hx of chronic anxiety disorder, COPD with chronic hypoxemia requiring 2-3L/min [place] supplemental O2, morbid obesity and episodes of hypercapnic encephalopathy.  She had admission for AECOPD with hypercapnia and required BIPAP ventilation for treatment in 2024.  She is accompanied by daughter who shares similar encephalopathy was present on prior hospitalization.  Her peripheral blood BMP shows bicarb chronically compensated with HCO3>40.  I was unable to obtain interview from patient due to encephalopathy. She had CXR today with no pulmonary edema or pneumonia noted.   11/25/22- patient seen at bedside, stable vital signs on 2L/min [place]. She was unable to tolerate CPAP overnight. We called Alexandria [name] her daughter and gave detailed report of therapy and medical plan. We encouraged her to use CPAP.      PAST MEDICAL HISTORY   Past Medical History:  Diagnosis Date   Allergy, unspecified not elsewhere classified    Anxiety    Aortic stenosis, mild 03/30/2022   a. TTE 03/29/2022: EF 55-60, no RWMA, GR 1 DD, normal RVSF, mild aortic stenosis (mean 10, V-max 212 cm/s, DI 0.70)   Cataract    Chronic diastolic congestive heart failure (HCC)    a. 06/2022 Echo: EF 60-65%, no rwma, nl RV fxn, mild MR, mild AS.   COPD (chronic obstructive pulmonary disease) (HCC) 03/08/1998   PFTs 12/12/2002 FEV 1 1.42 (64%) ratio 58 with no better after B2 and DLCO75%; PFTs 11/20/09 FEV1 1.50 (73%) ratio 50 no better after B2 with DLCO 62%; Hfa 50% 11/20/2009 >75%, 01/13/10 p coaching   Coronary artery calcification seen on CT scan    a. 2012 Neg stress test.   Depression 12/07/1998   Diabetes  mellitus type II 02/05/2006   Dr. Elvera [name] with [name]   Disorders of bursae and tendons in shoulder region, unspecified    Rotator cuff syndrome, right   E. coli bacteremia    Esophagitis    GERD (gastroesophageal reflux disease)    History of UTI    Hyperlipidemia 10/04/2000   Hypertension 03/08/1992   Hypothyroidism 03/08/1968   Iron deficiency anemia    Lung nodule    radiation starts 10-08-2019   Malignant neoplasm of right upper lobe of lung (HCC) 07/24/2019   Obesity    NOS   OSA (obstructive sleep apnea)    PSG 01/27/10 AHI 13, pt does not know CPAP settings   PAF (paroxysmal atrial fibrillation) (HCC)    a. 06/2022 in setting of UTI/encephalopathy-->converted on amio; b. CHA2DS2VASc = 6-->No OAC 2/2 chronic anemia/fall risk; c. 06/2022 Zio: Predominantly sinus bradycardia at 54 (3-158)'s.  1 brief run of SVT.  2.2% PVC burden.   Peripheral neuropathy    Likely due to DM per Dr. Tresa [name] hernia      SURGICAL HISTORY   Past Surgical History:  Procedure Laterality Date   ABD U/S  03/19/1999   Nml x2 foci in liver   ADENOSINE MYOVIEW  06/02/2007   Nml   CARDIOLITE PERSANTINE  08/24/2000   Nml   CAROTID U/S  08/24/2000  1-39% ICA stenosis   CAROTID U/S  06/02/2007   No apprec change    CARPAL TUNNEL RELEASE  12/1997   Right   CESAREAN SECTION     x2 Breech/ repeat   CHOLECYSTECTOMY  1997   COLONOSCOPY WITH PROPOFOL N/A 02/12/2016   Procedure: COLONOSCOPY WITH PROPOFOL;  Surgeon: Rachael Fee, MD;  Location: [facility];  Service: Endoscopy;  Laterality: N/A;   CT ABD W & PELVIS WO/W CM     Abd hemangiomas of liver, 1 cm R renal cyst   DENTAL SURGERY  2016   Implants   DEXA  07/03/2003   Nml   ESOPHAGOGASTRODUODENOSCOPY  12/05/1997   Nml (due to hoarseness)   ESOPHAGOGASTRODUODENOSCOPY (EGD) WITH PROPOFOL N/A 02/12/2016   Procedure: ESOPHAGOGASTRODUODENOSCOPY (EGD) WITH PROPOFOL;  Surgeon: Rachael Fee, MD;  Location: [facility];  Service: Endoscopy;   Laterality: N/A;   GALLBLADDER SURGERY     HERNIA REPAIR  01/24/2009   Lap Ventr w/ Lysis of adhesions (Dr. Dwain Sarna)   knee arthroscopic surgery  years ago   right   ROTATOR CUFF REPAIR  1984   Right, [place]   SHOULDER OPEN ROTATOR CUFF REPAIR  02/08/2012   Procedure: ROTATOR CUFF REPAIR SHOULDER OPEN;  Surgeon: Drucilla Schmidt, MD;  Location: [facility];  Service: Orthopedics;  Laterality: Left;  Left Shoulder Open Anterior Acrominectomy Rotator Cuff Repair Open Distal Clavicle Resection ,tissue mend graft, and repair of biceps tendon   THUMB RELEASE  12/1997   Right   TONSILLECTOMY     TOTAL ABDOMINAL HYSTERECTOMY  1985   Due to dysmennorhea   US ECHOCARDIOGRAPHY  06/02/2007     FAMILY HISTORY   Family History  Problem Relation Age of Onset   Heart disease Mother    Thyroid disease Mother    Emphysema Father        One lung   Cystic fibrosis Sister    Hyperthyroidism Sister    Osteoarthritis Brother    Hyperthyroidism Brother    Esophageal cancer Neg Hx    Cancer Neg Hx        Head or neck   Colon cancer Neg Hx    Stomach cancer Neg Hx    Breast cancer Neg Hx      SOCIAL HISTORY   Social History   Tobacco Use   Smoking status: Former    Current packs/day: 0.00    Average packs/day: 2.0 packs/day for 50.0 years (100.0 ttl pk-yrs)    Types: Cigarettes    Start date: 02/06/1955    Quit date: 02/05/2005    Years since quitting: 17.8   Smokeless tobacco: Never  Vaping Use   Vaping status: Never Used  Substance Use Topics   Alcohol use: Yes    Comment: occasionally   Drug use: Never     MEDICATIONS    Home Medication:    Current Medication:  Current Facility-Administered Medications:    0.9 %  sodium chloride infusion, , Intravenous, PRN, Rodolph Bong, MD, Last Rate: 10 mL/hr at 11/23/22 1507, New Bag at 11/23/22 1507   acetaminophen (TYLENOL) tablet 650 mg, 650 mg, Oral, Q6H PRN, 650 mg at 11/24/22 1547 **OR** acetaminophen (TYLENOL)  suppository 650 mg, 650 mg, Rectal, Q6H PRN, Andris Baumann, MD   amiodarone (PACERONE) tablet 200 mg, 200 mg, Oral, Daily, Rodolph Bong, MD, 200 mg at 11/24/22 0949   arformoterol (BROVANA) nebulizer solution 15 mcg, 15 mcg, Nebulization, QHS, Rodolph Bong, MD, 15 mcg  at 11/24/22 1923   bisoprolol (ZEBETA) tablet 2.5 mg, 2.5 mg, Oral, Daily, Lindajo Royal V, MD, 2.5 mg at 11/24/22 0950   budesonide (PULMICORT) nebulizer solution 0.5 mg, 0.5 mg, Nebulization, BID, Rodolph Bong, MD, 0.5 mg at 11/25/22 0728   busPIRone (BUSPAR) tablet 15 mg, 15 mg, Oral, TID, Rodolph Bong, MD, 15 mg at 11/24/22 2158   cholecalciferol (VITAMIN D3) 25 MCG (1000 UNIT) tablet 2,000 Units, 2,000 Units, Oral, Daily, Andris Baumann, MD, 2,000 Units at 11/24/22 4010   cyanocobalamin (VITAMIN B12) tablet 5,000 mcg, 5,000 mcg, Oral, Daily, Lindajo Royal V, MD, 5,000 mcg at 11/24/22 0948   ezetimibe (ZETIA) tablet 10 mg, 10 mg, Oral, Daily, Rodolph Bong, MD, 10 mg at 11/24/22 0950   ferrous sulfate tablet 325 mg, 325 mg, Oral, Daily, Lindajo Royal V, MD, 325 mg at 11/24/22 0948   FLUoxetine (PROZAC) capsule 20 mg, 20 mg, Oral, BID, Rodolph Bong, MD, 20 mg at 11/24/22 2159   fluticasone (FLONASE) 50 MCG/ACT nasal spray 2 spray, 2 spray, Each Nare, Daily, Rodolph Bong, MD, 2 spray at 11/24/22 0950   influenza vaccine adjuvanted (FLUAD) injection 0.5 mL, 0.5 mL, Intramuscular, Tomorrow-1000, Rodolph Bong, MD   insulin aspart (novoLOG) injection 0-20 Units, 0-20 Units, Subcutaneous, TID WC, Andris Baumann, MD, 11 Units at 11/24/22 1750   insulin aspart (novoLOG) injection 0-5 Units, 0-5 Units, Subcutaneous, QHS, Andris Baumann, MD, 2 Units at 11/24/22 2201   isosorbide mononitrate (IMDUR) 24 hr tablet 15 mg, 15 mg, Oral, Daily, Rodolph Bong, MD, 15 mg at 11/24/22 0948   levalbuterol (XOPENEX) nebulizer solution 0.63 mg, 0.63 mg, Nebulization, Q2H PRN, Rodolph Bong, MD    levalbuterol Pauline Aus) nebulizer solution 0.63 mg, 0.63 mg, Nebulization, BID, Rodolph Bong, MD, 0.63 mg at 11/25/22 2725   levothyroxine (SYNTHROID) tablet 125 mcg, 125 mcg, Oral, Q0600, Andris Baumann, MD, 125 mcg at 11/25/22 3664   loratadine (CLARITIN) tablet 10 mg, 10 mg, Oral, Daily, Rodolph Bong, MD, 10 mg at 11/24/22 0948   nitroGLYCERIN (NITROSTAT) SL tablet 0.4 mg, 0.4 mg, Sublingual, Q5 min PRN, Andris Baumann, MD   ondansetron [facility]) tablet 4 mg, 4 mg, Oral, Q6H PRN **OR** ondansetron (ZOFRAN) injection 4 mg, 4 mg, Intravenous, Q6H PRN, Andris Baumann, MD   Oral care mouth rinse, 15 mL, Mouth Rinse, PRN, Rodolph Bong, MD   pantoprazole (PROTONIX) EC tablet 40 mg, 40 mg, Oral, Q0600, Rodolph Bong, MD, 40 mg at 11/25/22 4034   polyethylene glycol (MIRALAX / GLYCOLAX) packet 17 g, 17 g, Oral, Daily, Rodolph Bong, MD, 17 g at 11/24/22 0948   potassium chloride (KLOR-CON M) CR tablet 10 mEq, 10 mEq, Oral, Daily, Rodolph Bong, MD, 10 mEq at 11/24/22 0949   predniSONE (DELTASONE) tablet 20 mg, 20 mg, Oral, QAC breakfast, Rodolph Bong, MD, 20 mg at 11/24/22 7425   rosuvastatin (CRESTOR) tablet 10 mg, 10 mg, Oral, QODAY, Andris Baumann, MD, 10 mg at 11/24/22 9563   senna-docusate (Senokot-S) tablet 1 tablet, 1 tablet, Oral, QHS, Rodolph Bong, MD, 1 tablet at 11/24/22 2159  Facility-Administered Medications Ordered in Other Encounters:    0.9 %  sodium chloride infusion, , Intravenous, Once, Brahmanday, Govinda R, MD   0.9 %  sodium chloride infusion, , Intravenous, Continuous, Earna Coder, MD, Last Rate: 20 mL/hr at 11/02/22 1421, New Bag at 11/02/22 1421    ALLERGIES  Antihistamines, diphenhydramine-type; Incruse ellipta [umeclidinium bromide]; Clarithromycin; Codeine; Diphenhydramine; Pregabalin; Sulfonamide derivatives; Tiotropium bromide monohydrate; Umeclidinium; and Vraylar [cariprazine]     REVIEW OF SYSTEMS     Review of Systems:  Gen:  Denies  fever, sweats, chills weigh loss  HEENT: Denies blurred vision, double vision, ear pain, eye pain, hearing loss, nose bleeds, sore throat Cardiac:  No dizziness, chest pain or heaviness, chest tightness,edema Resp:   reports dyspnea chronically  Gi: Denies swallowing difficulty, stomach pain, nausea or vomiting, diarrhea, constipation, bowel incontinence Gu:  Denies bladder incontinence, burning urine Ext:   Denies Joint pain, stiffness or swelling Skin: Denies  skin rash, easy bruising or bleeding or hives Endoc:  Denies polyuria, polydipsia , polyphagia or weight change Psych:   Denies depression, insomnia or hallucinations   Other:  All other systems negative   VS: BP (!) 132/50 (BP Location: Left Arm)   Pulse (!) 55   Temp 97.8 F (36.6 C) (Oral)   Resp 16   Ht 5\' 5"  (1.651 m)   Wt 115 kg   LMP  (LMP Unknown)   SpO2 99%   BMI 42.19 kg/m      PHYSICAL EXAM    GENERAL:NAD, no fevers, chills, no weakness no fatigue HEAD: Normocephalic, atraumatic.  EYES: Pupils equal, round, reactive to light. Extraocular muscles intact. No scleral icterus.  MOUTH: Moist mucosal membrane. Dentition intact. No abscess noted.  EAR, NOSE, THROAT: Clear without exudates. No external lesions.  NECK: Supple. No thyromegaly. No nodules. No JVD.  PULMONARY: decreased breath sounds with mild rhonchi worse at bases bilaterally.  CARDIOVASCULAR: S1 and S2. Regular rate and rhythm. No murmurs, rubs, or gallops. No edema. Pedal pulses 2+ bilaterally.  GASTROINTESTINAL: Soft, nontender, nondistended. No masses. Positive bowel sounds. No hepatosplenomegaly.  MUSCULOSKELETAL: No swelling, clubbing, or edema. Range of motion full in all extremities.  NEUROLOGIC: Cranial nerves II through XII are intact. No gross focal neurological deficits. Sensation intact. Reflexes intact.  SKIN: No ulceration, lesions, rashes, or cyanosis. Skin warm and dry. Turgor intact.   PSYCHIATRIC: Mood, affect within normal limits. The patient is awake, alert and oriented x 3. Insight, judgment intact.       IMAGING  Reivewed CXR from today  ASSESSMENT/PLAN   Acute on chronic hypoxemic hypercarbic respiratory failure    Due to Acute COPD exacerbation with hypoventilation [place] overlap -patient is on steroids and antimicrobials -patient is already receiving supplemental O2 with goal spO2 89-94% -ABG to evaluate need for BIPAP support with hypercapnia -RVP to rule out viral etiology of current exacerbation -CRP for background inflammation level in context of AECOPD -patient normally sees pulmonary with Dr Craige Cotta.  -will continue to follow along with you -we had attempted to use CPAP but patient declined.  We discussed this and she states she will try again.  She may have OSA COPD overlap and may need to have PSG on outpatient for evaluation of hypoventilation and OSA.            Thank you for allowing me to participate in the care of this patient.   Patient/Family are satisfied with care plan and all questions have been answered.    Provider disclosure: Patient with at least one acute or chronic illness or injury that poses a threat to life or bodily function and is being managed actively during this encounter.  All of the below services have been performed independently by signing provider:  review of prior documentation from internal and or external health  records.  Review of previous and current lab results.  Interview and comprehensive assessment during patient visit today. Review of current and previous chest radiographs/CT scans. Discussion of management and test interpretation with health care team and patient/family.   This document was prepared using Dragon voice recognition software and may include unintentional dictation errors.     Vida Rigger, M.D.  Division of Pulmonary & Critical Care Medicine

## 2022-11-25 NOTE — Progress Notes (Signed)
[facility]  - [place] at [facility]   PATIENT NAME: Alexandria [name]    MR#:  [id number]  DATE OF BIRTH:  1935-07-27  SUBJECTIVE:  patient seen earlier. She was sitting on the bedside commode. Respiratory wise doing well. Patient tells me she could not tolerated the CPAP at all. She is not wanting to use it. Encouraged her to do so. Sugar was down to 43. Took orange juice and stabilize. Recommended patient take bedtime snack. She is agreeable.   VITALS:  Blood pressure (!) 129/55, pulse (!) 57, temperature 98.1 F (36.7 C), temperature source Oral, resp. rate 16, height 5\' 5"  (1.651 m), weight 115 kg, SpO2 100%.  PHYSICAL EXAMINATION:   GENERAL:  87 y.o.-year-old patient with no acute distress. Morbidly obese LUNGS: Normal breath sounds bilaterally CARDIOVASCULAR: S1, S2 normal. No murmur   ABDOMEN: Soft, nontender, nondistended.   EXTREMITIES:+  edema b/l.    NEUROLOGIC: nonfocal  patient is alert and awake SKIN:  per RN   LABORATORY PANEL:  CBC Recent Labs  Lab 11/24/22 0606  WBC 7.5  HGB 11.0*  HCT 36.4  PLT 148*    Chemistries  Recent Labs  Lab 11/24/22 0606  NA 143  K 3.9  CL 97*  CO2 33*  GLUCOSE 83  BUN 36*  CREATININE 1.23*  CALCIUM 8.2*  MG 2.7*   Cardiac Enzymes No results for input(s): "TROPONINI" in the last 168 hours. RADIOLOGY:  No results found.  Assessment and Plan   87 year old female history of COPD on home O2 3 L, HFpEF EF 60 to 65% 06/2022, CKD stage IIIb, diabetes, OSA not on CPAP, stage I NSCLC s/p radiation 2021, chronic iron deficiency anemia for several years with upper and lower endoscopy in 2017 showing hemorrhoids and diverticulosis without hemorrhage seen in the cancer center and sent to the ED due to anemia  CT abdomen and pelvis done on admission with extensive colonic diverticulosis.   Acute on chronic respiratory failure hypoxic/hyper cardiac due to COPD, obstructive sleep apnea, acute on chronic diastolic  CHF mild history of stage I non-small cell lung cancer status post radiation 2021 -- patient currently on 2 L nasal cannula oxygen. Sats study are 95-hundred percent -- feeling overall better --course of antibiotic --x-ray repeat does not show any abnormality. -- PO steroid for two more days -- continue CPAP at nighttime-- per [facility] -- will set up CPAP at rehab per daughter Babette Relic as requested. Patient is not tolerating it too well. Encouraged her to try using it   Acute on chronic anemia iron deficiency -- patient has had G.I. workup done in the past. At present does not wish to pursue any workup per records -- status post two unit blood transfusion during this hospitalization -- received IV iron -- hemoglobin is 11.0  Acute on chronic diastolic heart failure -- patient had some weight gain -- echo reviewed -- urine output monitored -- will resume home dose of torsemide  Hypertension -- continue imdur and bisoprolol  PAF --Patient noted to have new onset A-fib in 2024 that converted with IV amiodarone. -Not a anticoagulation candidate in the setting of anemia and high fall risk. -Continue home regimen amiodarone.  Heart rate ranging from the 50s to the 70s on low-dose bisoprolol.  -Monitor heart rate with bisoprolol.    CKD stage IIIb -Stable.   Depression -Continue home regimen of Prozac.   -BuSpar increased back to home dose of 15 mg 3 times daily.  Hypothyroidism Continue Synthroid.    Morbid obesity/?OSA -Lifestyle modification -Outpatient follow-up with PCP. --pt will need formal PSG as out pt with Dr Craige Cotta --CPAP qhs    Family communication :tanya on the phone Consults :pulm CODE STATUS: full DVT Prophylaxis : Level of care: Telemetry Medical Status is: Inpatient Remains inpatient appropriate because: awaiting Rehab bed. Medically improving    TOTAL TIME TAKING CARE OF THIS PATIENT: 35 minutes.  >50% time spent on counselling and coordination  of care  Note: This dictation was prepared with Dragon dictation along with smaller phrase technology. Any transcriptional errors that result from this process are unintentional.  Enedina Finner M.D    [facility]   CC: Primary care physician; Joaquim Nam, MD

## 2022-11-25 NOTE — TOC Progression Note (Addendum)
Transition of Care [facility]) - Progression Note    Patient Details  Name: Alexandria [name] MRN: [id number] Date of Birth: 06-29-1935  Transition [facility]) CM/SW Contact  Darolyn Rua, [place] Phone Number: 11/25/2022, 9:46 AM  Clinical Narrative:     Update: 3:35 pm : Patient's daughter called requesting call from pulm MD regarding the " A PAP", she has concerns with [place] ordering the "CPAP" from facility, CSW has passed this along to pulm MD  Update 2:29 pm: cpap order and settings faxed to [facility] at [phone] for them to order  Update: Daughter called to report agreeable to Phineas Semen, MD aware will need settings for facility to order CPAP, working on this. CSW has called Phineas Semen with admissions and updated on above.     CSW spoke with patient's daughter Alexandria [name], she reports she is going to visit [facility]. CSW explained for a second time that we do not have a bed offer from [facility], she reports she is going to tour anyways.   CSW encouraged Alexandria [name] to tour [facility] rehab as we have a bed offer from them, second bed offer from [facility] and rehab that Alexandria [name] is declining due to past negative experience.   CSW has reached out to [place] at [facility] this morning again, was told no beds available yesterday, pending response this morning.   MD updated on above.   Expected Discharge Plan: Skilled Nursing Facility    Expected Discharge Plan and Services                                               Social Determinants of Health (SDOH) Interventions SDOH Screenings   Food Insecurity: No Food Insecurity (11/17/2022)  Housing: Low Risk  (11/17/2022)  Transportation Needs: No Transportation Needs (11/17/2022)  Utilities: Not At Risk (11/17/2022)  Alcohol Screen: Low Risk  (10/21/2022)  Depression (PHQ2-9): Low Risk  (10/21/2022)  Recent Concern: Depression (PHQ2-9) - Medium Risk (08/03/2022)  Financial Resource Strain: Low Risk  (10/21/2022)   Physical Activity: Insufficiently Active (10/21/2022)  Social Connections: Socially Isolated (10/21/2022)  Stress: No Stress Concern Present (10/21/2022)  Tobacco Use: Medium Risk (11/20/2022)  Health Literacy: Adequate Health Literacy (10/21/2022)    Readmission Risk Interventions    11/18/2022    4:47 PM  Readmission Risk Prevention Plan  Transportation Screening Complete  Medication Review (RN Care Manager) Complete  HRI or Home Care Consult Complete  SW Recovery Care/Counseling Consult Complete  Palliative Care Screening Not Applicable  Skilled Nursing Facility Complete

## 2022-11-25 NOTE — Progress Notes (Signed)
Mobility Specialist - Progress Note    11/25/22 0934  Mobility  Activity Transferred to/from [facility];Ambulated with assistance in room  Level of Assistance Standby assist, set-up cues, supervision of patient - no hands on  Assistive Device Front wheel walker  Distance Ambulated (ft) 6 ft  Activity Response Tolerated well  $Mobility charge 1 Mobility  Mobility Specialist Start Time (ACUTE ONLY) [id number]  Mobility Specialist Stop Time (ACUTE ONLY) 0932  Mobility Specialist Time Calculation (min) (ACUTE ONLY) 4 min   Pt sitting in the recliner upon entry, utilizing RA. Pt requesting to use the [facility]. Pt STS to RW MinG, amb to the [facility] SBA. Pt left seated on the [facility] with call bell within reach. NT notified.  Zetta Bills Mobility Specialist 11/25/22 9:37 AM

## 2022-11-26 ENCOUNTER — Inpatient Hospital Stay: Payer: Medicare Other | Admitting: Nurse Practitioner

## 2022-11-26 ENCOUNTER — Inpatient Hospital Stay: Payer: Medicare Other

## 2022-11-26 DIAGNOSIS — Z743 Need for continuous supervision: Secondary | ICD-10-CM | POA: Diagnosis not present

## 2022-11-26 DIAGNOSIS — I517 Cardiomegaly: Secondary | ICD-10-CM | POA: Diagnosis not present

## 2022-11-26 DIAGNOSIS — I700 Atherosclerosis of aorta: Secondary | ICD-10-CM | POA: Diagnosis not present

## 2022-11-26 DIAGNOSIS — J9621 Acute and chronic respiratory failure with hypoxia: Secondary | ICD-10-CM | POA: Diagnosis not present

## 2022-11-26 DIAGNOSIS — N1832 Chronic kidney disease, stage 3b: Secondary | ICD-10-CM | POA: Diagnosis not present

## 2022-11-26 DIAGNOSIS — E876 Hypokalemia: Secondary | ICD-10-CM | POA: Diagnosis present

## 2022-11-26 DIAGNOSIS — I2510 Atherosclerotic heart disease of native coronary artery without angina pectoris: Secondary | ICD-10-CM | POA: Diagnosis not present

## 2022-11-26 DIAGNOSIS — J9611 Chronic respiratory failure with hypoxia: Secondary | ICD-10-CM | POA: Diagnosis not present

## 2022-11-26 DIAGNOSIS — E1122 Type 2 diabetes mellitus with diabetic chronic kidney disease: Secondary | ICD-10-CM | POA: Diagnosis present

## 2022-11-26 DIAGNOSIS — I10 Essential (primary) hypertension: Secondary | ICD-10-CM | POA: Diagnosis not present

## 2022-11-26 DIAGNOSIS — E669 Obesity, unspecified: Secondary | ICD-10-CM | POA: Diagnosis present

## 2022-11-26 DIAGNOSIS — F418 Other specified anxiety disorders: Secondary | ICD-10-CM | POA: Diagnosis not present

## 2022-11-26 DIAGNOSIS — Z6839 Body mass index (BMI) 39.0-39.9, adult: Secondary | ICD-10-CM | POA: Diagnosis not present

## 2022-11-26 DIAGNOSIS — J449 Chronic obstructive pulmonary disease, unspecified: Secondary | ICD-10-CM | POA: Diagnosis not present

## 2022-11-26 DIAGNOSIS — R0602 Shortness of breath: Secondary | ICD-10-CM | POA: Diagnosis not present

## 2022-11-26 DIAGNOSIS — I959 Hypotension, unspecified: Secondary | ICD-10-CM | POA: Diagnosis not present

## 2022-11-26 DIAGNOSIS — D509 Iron deficiency anemia, unspecified: Secondary | ICD-10-CM | POA: Diagnosis present

## 2022-11-26 DIAGNOSIS — Z87891 Personal history of nicotine dependence: Secondary | ICD-10-CM | POA: Diagnosis not present

## 2022-11-26 DIAGNOSIS — M6281 Muscle weakness (generalized): Secondary | ICD-10-CM | POA: Diagnosis not present

## 2022-11-26 DIAGNOSIS — U071 COVID-19: Secondary | ICD-10-CM | POA: Diagnosis not present

## 2022-11-26 DIAGNOSIS — J9622 Acute and chronic respiratory failure with hypercapnia: Secondary | ICD-10-CM | POA: Diagnosis not present

## 2022-11-26 DIAGNOSIS — E039 Hypothyroidism, unspecified: Secondary | ICD-10-CM | POA: Diagnosis not present

## 2022-11-26 DIAGNOSIS — Z8249 Family history of ischemic heart disease and other diseases of the circulatory system: Secondary | ICD-10-CM | POA: Diagnosis not present

## 2022-11-26 DIAGNOSIS — I130 Hypertensive heart and chronic kidney disease with heart failure and stage 1 through stage 4 chronic kidney disease, or unspecified chronic kidney disease: Secondary | ICD-10-CM | POA: Diagnosis present

## 2022-11-26 DIAGNOSIS — J069 Acute upper respiratory infection, unspecified: Secondary | ICD-10-CM | POA: Diagnosis not present

## 2022-11-26 DIAGNOSIS — J441 Chronic obstructive pulmonary disease with (acute) exacerbation: Secondary | ICD-10-CM | POA: Diagnosis not present

## 2022-11-26 DIAGNOSIS — E1151 Type 2 diabetes mellitus with diabetic peripheral angiopathy without gangrene: Secondary | ICD-10-CM | POA: Diagnosis present

## 2022-11-26 DIAGNOSIS — E1142 Type 2 diabetes mellitus with diabetic polyneuropathy: Secondary | ICD-10-CM | POA: Diagnosis present

## 2022-11-26 DIAGNOSIS — I5032 Chronic diastolic (congestive) heart failure: Secondary | ICD-10-CM | POA: Diagnosis not present

## 2022-11-26 DIAGNOSIS — G4733 Obstructive sleep apnea (adult) (pediatric): Secondary | ICD-10-CM | POA: Diagnosis not present

## 2022-11-26 DIAGNOSIS — E785 Hyperlipidemia, unspecified: Secondary | ICD-10-CM | POA: Diagnosis present

## 2022-11-26 DIAGNOSIS — R2681 Unsteadiness on feet: Secondary | ICD-10-CM | POA: Diagnosis not present

## 2022-11-26 DIAGNOSIS — Z79890 Hormone replacement therapy (postmenopausal): Secondary | ICD-10-CM | POA: Diagnosis not present

## 2022-11-26 DIAGNOSIS — I480 Paroxysmal atrial fibrillation: Secondary | ICD-10-CM | POA: Diagnosis not present

## 2022-11-26 DIAGNOSIS — T380X5A Adverse effect of glucocorticoids and synthetic analogues, initial encounter: Secondary | ICD-10-CM | POA: Diagnosis present

## 2022-11-26 DIAGNOSIS — E1140 Type 2 diabetes mellitus with diabetic neuropathy, unspecified: Secondary | ICD-10-CM | POA: Diagnosis not present

## 2022-11-26 DIAGNOSIS — M6259 Muscle wasting and atrophy, not elsewhere classified, multiple sites: Secondary | ICD-10-CM | POA: Diagnosis not present

## 2022-11-26 DIAGNOSIS — D62 Acute posthemorrhagic anemia: Secondary | ICD-10-CM | POA: Diagnosis not present

## 2022-11-26 LAB — GLUCOSE, CAPILLARY: Glucose-Capillary: 102 mg/dL — ABNORMAL HIGH (ref 70–99)

## 2022-11-26 MED ORDER — SENNOSIDES-DOCUSATE SODIUM 8.6-50 MG PO TABS: 1.0000 | ORAL_TABLET | Freq: Every day | ORAL | 0 refills | Status: DC

## 2022-11-26 MED ORDER — FLUTICASONE PROPIONATE 50 MCG/ACT NA SUSP: 2.0000 | Freq: Every day | NASAL | 2 refills | Status: DC

## 2022-11-26 MED ORDER — LORATADINE 10 MG PO TABS: 10.0000 mg | ORAL_TABLET | Freq: Every day | ORAL | 0 refills | Status: DC | PRN

## 2022-11-26 MED ORDER — POLYETHYLENE GLYCOL 3350 17 G PO PACK: 17.0000 g | PACK | Freq: Every day | ORAL | 0 refills | Status: DC

## 2022-11-26 NOTE — TOC Progression Note (Signed)
Transition of Care [facility]) - Progression Note    Patient Details  Name: Alexandria [name] MRN: [id number] Date of Birth: 1935/07/07  Transition of Care [facility]) CM/SW Contact  Darolyn Rua, [place] Phone Number: 11/26/2022, 11:06 AM  Clinical Narrative:     CSW has reached out to [facility] admissions pending CPAP delivery to facility at this time.    Expected Discharge Plan: Skilled Nursing Facility    Expected Discharge Plan and Services                                               Social Determinants of Health (SDOH) Interventions SDOH Screenings   Food Insecurity: No Food Insecurity (11/17/2022)  Housing: Low Risk  (11/17/2022)  Transportation Needs: No Transportation Needs (11/17/2022)  Utilities: Not At Risk (11/17/2022)  Alcohol Screen: Low Risk  (10/21/2022)  Depression (PHQ2-9): Low Risk  (10/21/2022)  Recent Concern: Depression (PHQ2-9) - Medium Risk (08/03/2022)  Financial Resource Strain: Low Risk  (10/21/2022)  Physical Activity: Insufficiently Active (10/21/2022)  Social Connections: Socially Isolated (10/21/2022)  Stress: No Stress Concern Present (10/21/2022)  Tobacco Use: Medium Risk (11/20/2022)  Health Literacy: Adequate Health Literacy (10/21/2022)    Readmission Risk Interventions    11/18/2022    4:47 PM  Readmission Risk Prevention Plan  Transportation Screening Complete  Medication Review (RN Care Manager) Complete  HRI or Home Care Consult Complete  SW Recovery Care/Counseling Consult Complete  Palliative Care Screening Not Applicable  Skilled Nursing Facility Complete

## 2022-11-26 NOTE — Plan of Care (Signed)

## 2022-11-26 NOTE — Discharge Summary (Addendum)
Physician Discharge Summary   Patient: Alexandria [name] MRN: [id number] DOB: [birth year]  Admit date:     11/16/2022  Discharge date: 11/26/22  Discharge Physician: Enedina Finner   PCP: Joaquim Nam, MD   Recommendations at discharge:    Use your CPAP< oxygen, inhalers and nebs as instructed F/u Dr Craige Cotta Pulmonary in 10--12 days  Discharge Diagnoses: Principal Problem:   Acute on chronic blood loss anemia Active Problems:   DOE (dyspnea on exertion)   COPD with chronic bronchitis and emphysema (HCC)   Chronic respiratory failure with hypoxia (HCC)   OSA on CPAP   Malignant neoplasm of right upper lobe of lung (HCC)   Essential hypertension   Chronic diastolic (congestive) heart failure (HCC)   CAD (coronary artery disease)   Paroxysmal atrial fibrillation (HCC)   PAD (peripheral artery disease) (HCC)   Type 2 diabetes mellitus with diabetic neuropathy, without long-term current use of insulin (HCC)   Stage 3b chronic kidney disease (HCC)   Hypothyroidism   Obesity, Class III, BMI 40-49.9 (morbid obesity) (HCC)   Depression, recurrent (HCC)   Symptomatic anemia   Acute on chronic diastolic CHF (congestive heart failure) (HCC)   COPD with acute exacerbation (HCC)   Confusion   Constipation   Hypernatremia  87 year old female history of COPD on home O2 3 L, HFpEF EF 60 to 65% 06/2022, CKD stage IIIb, diabetes, OSA not on CPAP, stage I NSCLC s/p radiation 2021, chronic iron deficiency anemia for several years with upper and lower endoscopy in 2017 showing hemorrhoids and diverticulosis without hemorrhage seen in the cancer center and sent to the ED due to anemia  CT abdomen and pelvis done on admission with extensive colonic diverticulosis.    Acute on chronic respiratory failure hypoxic/hyper cardiac due to COPD, obstructive sleep apnea, acute on chronic diastolic CHF mild history of stage I non-small cell lung cancer status post radiation 2021 -- patient currently on 2 L nasal  cannula oxygen. Sats are 94-100% on 2liters [place] -- feeling overall better --completed course of antibiotic --x-ray repeat does not show any abnormality. -- completed PO steroids -- continue CPAP at nighttime-- per [facility] -- will set up CPAP at rehab. Pt is now  agreeable and was able to wear it last nite --she will f/u Dr Craige Cotta Pulmonary as out pt --OK from Dr Terence Lux standpoint for d/c  Acute on chronic anemia iron deficiency -- patient has had G.I. workup done in the past. At present does not wish to pursue any workup per records -- status post two unit blood transfusion during this hospitalization -- received IV iron -- hemoglobin is 11.0   Acute on chronic diastolic heart failure -- patient had some weight gain -- echo reviewed -- urine output monitored -- will resume home dose of torsemide   Hypertension -- continue present meds   PAF --Patient noted to have new onset A-fib in 2024 that converted with IV amiodarone. -Not a anticoagulation candidate in the setting of anemia and high fall risk. -Continue home regimen amiodarone.  Heart rate ranging from the 50s to the 70s on low-dose bisoprolol.  -Monitor heart rate with bisoprolol.     CKD stage IIIb -Stable.   Depression -Continue home regimen of Prozac.   -BuSpar increased back to home dose of 15 mg 3 times daily.     Hypothyroidism Continue Synthroid.    Morbid obesity/?OSA -Lifestyle modification -Outpatient follow-up with PCP. --pt will need formal PSG as out pt with Dr Craige Cotta --  CPAP qhs     Family communication :Dr Karna Christmas has spoken with tanya on the phone Consults :pulm CODE STATUS: full DVT Prophylaxis :SCD  D/c to rehab today. Pt agreeable     Pain control - [place] Controlled Substance Reporting System database was reviewed. and patient was instructed, not to drive, operate heavy machinery, perform activities at heights, swimming or participation in water activities or provide  baby-sitting services while on Pain, Sleep and Anxiety Medications; until their outpatient Physician has advised to do so again. Also recommended to not to take more than prescribed Pain, Sleep and Anxiety Medications.    Disposition: Rehabilitation facility Diet recommendation:  Discharge Diet Orders (From admission, onward)     Start     Ordered   11/26/22 0000  Diet - low sodium heart healthy        11/26/22 1229           Cardiac diet DISCHARGE MEDICATION: Allergies as of 11/26/2022       Reactions   Antihistamines, Diphenhydramine-type Other (See Comments)   Able to tolerate only allegra.    Incruse Ellipta [umeclidinium Bromide] Cough   Caused voice change and severe coughing   Clarithromycin Other (See Comments)   REACTION: diff swallowing and mouth blisters   Codeine Other (See Comments)   Unknown    Diphenhydramine    Pregabalin Other (See Comments)   Lack of effect for neuropathy pain.   Sulfonamide Derivatives    Tiotropium Bromide Monohydrate Other (See Comments)   Voice changes   Umeclidinium    Vraylar [cariprazine]    intolerant        Medication List     STOP taking these medications    melatonin 5 MG Tabs   metFORMIN 500 MG tablet Commonly known as: GLUCOPHAGE   nystatin 100000 UNIT/ML suspension Commonly known as: MYCOSTATIN   Zinc Oxide 12.8 % ointment Commonly known as: TRIPLE PASTE       TAKE these medications    acetaminophen 325 MG tablet Commonly known as: TYLENOL Take 2 tablets (650 mg total) by mouth every 6 (six) hours as needed for mild pain, headache, fever or moderate pain.   albuterol 108 (90 Base) MCG/ACT inhaler Commonly known as: ProAir HFA Inhale 2 puffs into the lungs every 6 (six) hours as needed for wheezing or shortness of breath. INHALE 2 PUFFS INTO THE LUNGS EVERY 6 HOURS AS NEEDED FOR WHEEZING OR SHORTNESS OF BREATH   amiodarone 200 MG tablet Commonly known as: PACERONE TAKE 1 TABLET BY MOUTH EVERY DAY    arformoterol 15 MCG/2ML Nebu Commonly known as: BROVANA Take 2 mLs (15 mcg total) by nebulization at bedtime.   B-12 5000 MCG Caps Take 5,000 mcg by mouth daily.   bisoprolol 5 MG tablet Commonly known as: ZEBETA Take 0.5 tablets (2.5 mg total) by mouth daily.   budesonide 0.5 MG/2ML nebulizer solution Commonly known as: Pulmicort Take 2 mLs (0.5 mg total) by nebulization in the morning and at bedtime.   busPIRone 15 MG tablet Commonly known as: BUSPAR Take 15 mg by mouth 3 (three) times daily.   Coenzyme Q-10 200 MG Caps Take 200 mg by mouth daily.   esomeprazole 40 MG capsule Commonly known as: NEXIUM TAKE 1 CAPSULE (40 MG TOTAL) BY MOUTH DAILY AT 12 NOON.   ezetimibe 10 MG tablet Commonly known as: ZETIA TAKE 1 TABLET BY MOUTH EVERYDAY AT BEDTIME   ferrous sulfate 325 (65 FE) MG tablet Take 1 tablet (325  mg total) by mouth daily.   FLUoxetine 20 MG tablet Commonly known as: PROZAC Take 20 mg by mouth 2 (two) times daily.   fluticasone 50 MCG/ACT nasal spray Commonly known as: FLONASE Place 2 sprays into both nostrils daily. Start taking on: November 27, 2022   guaiFENesin 600 MG 12 hr tablet Commonly known as: Mucinex Take 2 tablets (1,200 mg total) by mouth 2 (two) times daily as needed for cough or to loosen phlegm.   isosorbide mononitrate 30 MG 24 hr tablet Commonly known as: IMDUR Take 30 mg by mouth daily.   levothyroxine 125 MCG tablet Commonly known as: SYNTHROID Take 1 tablet (125 mcg total) by mouth daily.   loratadine 10 MG tablet Commonly known as: CLARITIN Take 1 tablet (10 mg total) by mouth daily as needed for allergies.   magnesium 30 MG tablet Take 1 tablet (30 mg total) by mouth 2 (two) times daily.   nitroGLYCERIN 0.4 MG SL tablet Commonly known as: NITROSTAT Place 1 tablet (0.4 mg total) under the tongue every 5 (five) minutes as needed for chest pain.   OneTouch Delica Lancets 33G Misc Use 1x a day   OneTouch Verio test  strip Generic drug: glucose blood USE AS INSTRUCTED TO CHECK SUGAR 1 TIME DAILY   OXYGEN Inhale into the lungs. 2lpm   polyethylene glycol 17 g packet Commonly known as: MIRALAX / GLYCOLAX Take 17 g by mouth daily. Start taking on: November 27, 2022   potassium chloride 10 MEQ tablet Commonly known as: KLOR-CON TAKE 1 TABLET BY MOUTH EVERY DAY   rosuvastatin 10 MG tablet Commonly known as: CRESTOR TAKE 1 TABLET BY MOUTH EVERY OTHER DAY   senna-docusate 8.6-50 MG tablet Commonly known as: Senokot-S Take 1 tablet by mouth at bedtime.   Spacer/Aero Chamber [employer] Use with  inhaler as needed.  J44.9   torsemide 20 MG tablet Commonly known as: DEMADEX Take 2 tablets (40 mg total) by mouth daily. Take an additional 20 mg daily as needed for weight greater then 230 pounds   Vitamin D 50 MCG (2000 UT) tablet Take 1 tablet (2,000 Units total) by mouth daily.        Follow-up Information     Joaquim Nam, MD. Schedule an appointment as soon as possible for a visit in 1 week(s).   Specialty: Family Medicine Why: hospital f/u Contact information: [address] [place] [address] [phone]         Coralyn Helling, MD. Schedule an appointment as soon as possible for a visit in 10 day(s).   Specialties: Pulmonary Disease, Sleep Medicine Why: hospital f/u COPD/OSA Contact information: [address] [place] [address] [phone]                Discharge Exam: Filed Weights   11/20/22 0500 11/22/22 0500 11/24/22 5784  Weight: 105.6 kg 105.8 kg 115 kg  GENERAL:  87 y.o.-year-old patient with no acute distress. Morbidly obese LUNGS: decreased breath sounds bilaterally, no resp distress CARDIOVASCULAR: S1, S2 normal. No murmur   ABDOMEN: Soft, nontender, nondistended.   EXTREMITIES:+  edema b/l.    NEUROLOGIC: nonfocal  patient is alert and awake SKIN:  per RN  Condition at discharge: fair  The results of  significant diagnostics from this hospitalization (including imaging, microbiology, ancillary and laboratory) are listed below for reference.   Imaging Studies: DG Chest 2 View  Result Date: 11/23/2022 CLINICAL DATA:  Shortness of breath. EXAM: CHEST -  2 VIEW COMPARISON:  November 16, 2022. FINDINGS: Stable cardiomegaly. No acute pulmonary disease is noted. Bony thorax is unremarkable. IMPRESSION: No active cardiopulmonary disease. Aortic Atherosclerosis (ICD10-I70.0). Electronically Signed   By: Lupita Raider M.D.   On: 11/23/2022 15:34   CT ABDOMEN PELVIS W CONTRAST  Result Date: 11/16/2022 CLINICAL DATA:  Right-sided abdominal pain. Known hernia with known cancer. Non-small-cell lung cancer. * Tracking Code: BO * EXAM: CT ABDOMEN AND PELVIS WITH CONTRAST TECHNIQUE: Multidetector CT imaging of the abdomen and pelvis was performed using the standard protocol following bolus administration of intravenous contrast. RADIATION DOSE REDUCTION: This exam was performed according to the departmental dose-optimization program which includes automated exposure control, adjustment of the mA and/or kV according to patient size and/or use of iterative reconstruction technique. CONTRAST:  80mL OMNIPAQUE IOHEXOL 300 MG/ML  SOLN COMPARISON:  Abdomen and pelvis CT 03/17/2021. Chest CT scan 09/17/2022 FINDINGS: Lower chest: There is some basilar atelectasis. No pleural effusion. There is a 3 mm nodule medial left lower lobe on series 7, image 5. Calcifications along the mitral valve annulus. No pericardial effusion. Coronary artery calcifications are seen. Hepatobiliary: Once again there are hepatic hemangiomas in segment 8 and 2 as on previous. Previous cholecystectomy. Patent portal vein. Pancreas: Moderate atrophy of the pancreas. Spleen: Normal in size without focal abnormality. Adrenals/Urinary Tract: The adrenal glands are preserved. No collecting system dilatation. The ureters have normal course and caliber  extending down to the bladder. Preserved contours of the urinary bladder. Once again the kidneys has some Bosniak 1 renal cysts. Largest is seen exophytic from the anterior aspect of the right kidney measuring 3.5 cm. Hounsfield unit of 15. However there is 1 lesion along the upper aspect of the left kidney laterally which is more complex. Precontrast density of 37 38, portal venous phase 52 and delayed with a somewhat thick potentially enhancing wall and Hounsfield units of 65. Possible solid mass or renal cell carcinoma. Recommend further workup when appropriate. No abnormal calcifications are seen within either kidney nor along the course of either ureter on the noncontrast dataset. Stomach/Bowel: Stomach is underdistended on this non oral contrast exam. Question fold thickening. Please correlate with symptoms. The small bowel is nondilated. There is extensive colonic diverticulosis without dilatation or obstruction. Normal appendix seen retrocecal. Vascular/Lymphatic: Extensive vascular calcifications along the aorta and branch vessels. Normal caliber aorta and IVC. No discrete abnormal lymph node enlargement identified in the abdomen and pelvis. Reproductive: Status post hysterectomy. No adnexal masses. Other: No free air or free fluid. There is mesh along the anterior abdominal wall. Musculoskeletal: Osteopenia. Scattered degenerative changes. Trace anterolisthesis of L5 on S1. Significant degenerative changes as well of the pelvis. IMPRESSION: Extensive colonic diverticulosis. Normal appendix. No bowel obstruction, free air or free fluid. There is collapsed stomach but gastric fold thickening. Please correlate with symptoms. Bilateral renal cystic foci. There is 1 potentially enhancing lesion in the upper pole left kidney measuring 19 mm. Recommend dedicated MRI evaluation when appropriate to further assess and exclude underlying aggressive lesion. Known hepatic hemangiomas. Electronically Signed   By: Karen Kays M.D.   On: 11/16/2022 19:33   DG Chest 2 View  Result Date: 11/16/2022 CLINICAL DATA:  Shortness of breath. EXAM: CHEST - 2 VIEW COMPARISON:  Chest radiographs 07/05/2022, 06/29/2022; CT chest 09/17/2022 and 03/19/2022 FINDINGS: Cardiac silhouette is again mildly to mildly enlarged. Moderate atherosclerotic calcification is again seen within the aortic arch. There is again flattening of the diaphragms and mild hyperinflation.  Unchanged opacity overlying the superomedial right lung corresponding to the chronic scarring seen on prior 09/17/2022 and 03/19/2022 CTs. Mild blunting of the posterior costophrenic angle is similar to 03/04/2022 and favored represent mild scarring. No definite pleural effusion. No pneumothorax. Moderate multilevel degenerative disc changes of the thoracic spine. IMPRESSION: Mild chronic hyperinflation.  No acute lung process. Electronically Signed   By: Neita Garnet M.D.   On: 11/16/2022 17:02    Microbiology: Results for orders placed or performed during the hospital encounter of 11/16/22  Urine Culture (for pregnant, neutropenic or urologic patients or patients with an indwelling urinary catheter)     Status: Abnormal   Collection Time: 11/22/22 11:11 AM   Specimen: Urine, Catheterized  Result Value Ref Range Status   Specimen Description   Final    URINE, CATHETERIZED Performed at [facility], [address], [place], [place] [address]    Special Requests   Final    NONE Performed at [facility], [address], [place], [place] [address]    Culture (A)  Final    <10,000 COLONIES/mL INSIGNIFICANT GROWTH Performed at [facility], [address], [place], [place] [address]    Report Status 11/23/2022 FINAL  Final  SARS Coronavirus 2 by RT PCR (hospital order, performed in [facility] hospital lab) *cepheid single result test* Anterior Nasal Swab     Status: None   Collection Time: 11/23/22  2:21 PM   Specimen: Anterior Nasal Swab   Result Value Ref Range Status   SARS Coronavirus 2 by RT PCR NEGATIVE NEGATIVE Final    Comment: (NOTE) SARS-CoV-2 target nucleic acids are NOT DETECTED.  The SARS-CoV-2 RNA is generally detectable in upper and lower respiratory specimens during the acute phase of infection. The lowest concentration of SARS-CoV-2 viral copies this assay can detect is 250 copies / mL. A negative result does not preclude SARS-CoV-2 infection and should not be used as the sole basis for treatment or other patient management decisions.  A negative result may occur with improper specimen collection / handling, submission of specimen other than nasopharyngeal swab, presence of viral mutation(s) within the areas targeted by this assay, and inadequate number of viral copies (<250 copies / mL). A negative result must be combined with clinical observations, patient history, and epidemiological information.  Fact Sheet for Patients:   RoadLapTop.co.za  Fact Sheet for Healthcare Providers: http://kim-miller.com/  This test is not yet approved or  cleared by the [place] FDA and has been authorized for detection and/or diagnosis of SARS-CoV-2 by FDA under an Emergency Use Authorization (EUA).  This EUA will remain in effect (meaning this test can be used) for the duration of the COVID-19 declaration under Section 564(b)(1) of the Act, 21 U.S.C. section 360bbb-3(b)(1), unless the authorization is terminated or revoked sooner.  Performed at [facility], [address] [place], [place] [address]   Respiratory (~20 pathogens) panel by PCR     Status: None   Collection Time: 11/23/22  2:21 PM   Specimen: Nasopharyngeal Swab; Respiratory  Result Value Ref Range Status   Adenovirus NOT DETECTED NOT DETECTED Final   Coronavirus 229E NOT DETECTED NOT DETECTED Final    Comment: (NOTE) The Coronavirus on the Respiratory Panel, DOES NOT test for the novel   Coronavirus (2019 nCoV)    Coronavirus HKU1 NOT DETECTED NOT DETECTED Final   Coronavirus NL63 NOT DETECTED NOT DETECTED Final   Coronavirus OC43 NOT DETECTED NOT DETECTED Final  Metapneumovirus NOT DETECTED NOT DETECTED Final   Rhinovirus / Enterovirus NOT DETECTED NOT DETECTED Final   Influenza A NOT DETECTED NOT DETECTED Final   Influenza B NOT DETECTED NOT DETECTED Final   Parainfluenza Virus 1 NOT DETECTED NOT DETECTED Final   Parainfluenza Virus 2 NOT DETECTED NOT DETECTED Final   Parainfluenza Virus 3 NOT DETECTED NOT DETECTED Final   Parainfluenza Virus 4 NOT DETECTED NOT DETECTED Final   Respiratory Syncytial Virus NOT DETECTED NOT DETECTED Final   Bordetella pertussis NOT DETECTED NOT DETECTED Final   Bordetella Parapertussis NOT DETECTED NOT DETECTED Final   Chlamydophila pneumoniae NOT DETECTED NOT DETECTED Final   Mycoplasma pneumoniae NOT DETECTED NOT DETECTED Final    Comment: Performed at [facility], [address], [place], [place] [address]   *Note: Due to a large number of results and/or encounters for the requested time period, some results have not been displayed. A complete set of results can be found in Results Review.    Labs: CBC: Recent Labs  Lab 11/20/22 0452 11/21/22 0513 11/22/22 0457 11/23/22 0736 11/24/22 0606  WBC 7.6 6.0 6.1 6.7 7.5  HGB 9.8* 9.6* 10.1* 11.4* 11.0*  HCT 31.9* 30.8* 32.3* 37.3 36.4  MCV 100.9* 100.7* 100.9* 102.5* 104.0*  PLT 190 189 163 174 148*   Basic Metabolic Panel: Recent Labs  Lab 11/20/22 0452 11/21/22 0513 11/22/22 0457 11/23/22 0736 11/24/22 0606  NA 143 142 144 147* 143  K 4.2 3.5 3.4* 4.2 3.9  CL 94* 94* 98 97* 97*  CO2 38* 35* 35* 40* 33*  GLUCOSE 135* 174* 104* 92 83  BUN 46* 50* 46* 42* 36*  CREATININE 1.24* 1.20* 1.21* 1.50* 1.23*  CALCIUM 8.3* 8.1* 8.6* 8.7* 8.2*  MG  --   --   --  2.5* 2.7*   CBG: Recent Labs  Lab 11/25/22 1024 11/25/22 1149 11/25/22 1656 11/25/22 2106  11/26/22 0825  GLUCAP 89 162* 178* 332* 102*    Discharge time spent: greater than 30 minutes.  Signed: Enedina Finner, MD [facility] 11/26/2022

## 2022-11-26 NOTE — Progress Notes (Signed)
PULMONOLOGY         Date: 11/26/2022,   MRN# [id number] Alexandria [name] March 06, 1936     AdmissionWeight: 105.7 kg                 CurrentWeight: 115 kg  Referring provider: Dr Charlesetta Shanks   CHIEF COMPLAINT:   Acute on chronic hypoxemic hypercarbic respiratory failure   HISTORY OF PRESENT ILLNESS   This is an 87 yo F with hx of chronic anxiety disorder, COPD with chronic hypoxemia requiring 2-3L/min Curry supplemental O2, morbid obesity and episodes of hypercapnic encephalopathy.  She had admission for AECOPD with hypercapnia and required BIPAP ventilation for treatment in 2024.  She is accompanied by daughter who shares similar encephalopathy was present on prior hospitalization.  Her peripheral blood BMP shows bicarb chronically compensated with HCO3>40.  I was unable to obtain interview from patient due to encephalopathy. She had CXR today with no pulmonary edema or pneumonia noted.   11/25/22- patient seen at bedside, stable vital signs on 2L/min . She was unable to tolerate CPAP overnight. We called [name] her daughter and gave detailed report of therapy and medical plan. We encouraged her to use CPAP.   11/26/22- patient is stable.  She is on 2-3L/min. She had CPAP ordered and it has been approved. She is going to rehab and I discussed everyithig with daughter [name].    PAST MEDICAL HISTORY   Past Medical History:  Diagnosis Date   Allergy, unspecified not elsewhere classified    Anxiety    Aortic stenosis, mild 03/30/2022   a. TTE 03/29/2022: EF 55-60, no RWMA, GR 1 DD, normal RVSF, mild aortic stenosis (mean 10, V-max 212 cm/s, DI 0.70)   Cataract    Chronic diastolic congestive heart failure (HCC)    a. 06/2022 Echo: EF 60-65%, no rwma, nl RV fxn, mild MR, mild AS.   COPD (chronic obstructive pulmonary disease) (HCC) 03/08/1998   PFTs 12/12/2002 FEV 1 1.42 (64%) ratio 58 with no better after B2 and DLCO75%; PFTs 11/20/09 FEV1 1.50 (73%) ratio 50 no better after B2 with  DLCO 62%; Hfa 50% 11/20/2009 >75%, 01/13/10 p coaching   Coronary artery calcification seen on CT scan    a. 2012 Neg stress test.   Depression 12/07/1998   Diabetes mellitus type II 02/05/2006   Dr. Elvera Lennox with endo   Disorders of bursae and tendons in shoulder region, unspecified    Rotator cuff syndrome, right   E. coli bacteremia    Esophagitis    GERD (gastroesophageal reflux disease)    History of UTI    Hyperlipidemia 10/04/2000   Hypertension 03/08/1992   Hypothyroidism 03/08/1968   Iron deficiency anemia    Lung nodule    radiation starts 10-08-2019   Malignant neoplasm of right upper lobe of lung (HCC) 07/24/2019   Obesity    NOS   OSA (obstructive sleep apnea)    PSG 01/27/10 AHI 13, pt does not know CPAP settings   PAF (paroxysmal atrial fibrillation) (HCC)    a. 06/2022 in setting of UTI/encephalopathy-->converted on amio; b. CHA2DS2VASc = 6-->No OAC 2/2 chronic anemia/fall risk; c. 06/2022 Zio: Predominantly sinus bradycardia at 54 (3-158)'s.  1 brief run of SVT.  2.2% PVC burden.   Peripheral neuropathy    Likely due to DM per Dr. Tresa Endo hernia      SURGICAL HISTORY   Past Surgical History:  Procedure Laterality Date   ABD U/S  03/19/1999  Nml x2 foci in liver   ADENOSINE MYOVIEW  06/02/2007   Nml   CARDIOLITE PERSANTINE  08/24/2000   Nml   CAROTID U/S  08/24/2000   1-39% ICA stenosis   CAROTID U/S  06/02/2007   No apprec change    CARPAL TUNNEL RELEASE  12/1997   Right   CESAREAN SECTION     x2 Breech/ repeat   CHOLECYSTECTOMY  1997   COLONOSCOPY WITH PROPOFOL N/A 02/12/2016   Procedure: COLONOSCOPY WITH PROPOFOL;  Surgeon: Rachael Fee, MD;  Location: [facility];  Service: Endoscopy;  Laterality: N/A;   CT ABD W & PELVIS WO/W CM     Abd hemangiomas of liver, 1 cm R renal cyst   DENTAL SURGERY  2016   Implants   DEXA  07/03/2003   Nml   ESOPHAGOGASTRODUODENOSCOPY  12/05/1997   Nml (due to hoarseness)   ESOPHAGOGASTRODUODENOSCOPY  (EGD) WITH PROPOFOL N/A 02/12/2016   Procedure: ESOPHAGOGASTRODUODENOSCOPY (EGD) WITH PROPOFOL;  Surgeon: Rachael Fee, MD;  Location: [facility];  Service: Endoscopy;  Laterality: N/A;   GALLBLADDER SURGERY     HERNIA REPAIR  01/24/2009   Lap Ventr w/ Lysis of adhesions (Dr. Dwain Sarna)   knee arthroscopic surgery  years ago   right   ROTATOR CUFF REPAIR  1984   Right, [place]   SHOULDER OPEN ROTATOR CUFF REPAIR  02/08/2012   Procedure: ROTATOR CUFF REPAIR SHOULDER OPEN;  Surgeon: Drucilla Schmidt, MD;  Location: [facility];  Service: Orthopedics;  Laterality: Left;  Left Shoulder Open Anterior Acrominectomy Rotator Cuff Repair Open Distal Clavicle Resection ,tissue mend graft, and repair of biceps tendon   THUMB RELEASE  12/1997   Right   TONSILLECTOMY     TOTAL ABDOMINAL HYSTERECTOMY  1985   Due to dysmennorhea   US ECHOCARDIOGRAPHY  06/02/2007     FAMILY HISTORY   Family History  Problem Relation Age of Onset   Heart disease Mother    Thyroid disease Mother    Emphysema Father        One lung   Cystic fibrosis Sister    Hyperthyroidism Sister    Osteoarthritis Brother    Hyperthyroidism Brother    Esophageal cancer Neg Hx    Cancer Neg Hx        Head or neck   Colon cancer Neg Hx    Stomach cancer Neg Hx    Breast cancer Neg Hx      SOCIAL HISTORY   Social History   Tobacco Use   Smoking status: Former    Current packs/day: 0.00    Average packs/day: 2.0 packs/day for 50.0 years (100.0 ttl pk-yrs)    Types: Cigarettes    Start date: 02/06/1955    Quit date: 02/05/2005    Years since quitting: 17.8   Smokeless tobacco: Never  Vaping Use   Vaping status: Never Used  Substance Use Topics   Alcohol use: Yes    Comment: occasionally   Drug use: Never     MEDICATIONS    Home Medication:    Current Medication:  Current Facility-Administered Medications:    0.9 %  sodium chloride infusion, , Intravenous, PRN, Rodolph Bong, MD, Last Rate: 10  mL/hr at 11/23/22 1507, New Bag at 11/23/22 1507   acetaminophen (TYLENOL) tablet 650 mg, 650 mg, Oral, Q6H PRN, 650 mg at 11/26/22 0606 **OR** acetaminophen (TYLENOL) suppository 650 mg, 650 mg, Rectal, Q6H PRN, Andris Baumann, MD   amiodarone (PACERONE) tablet 200 mg,  200 mg, Oral, Daily, Rodolph Bong, MD, 200 mg at 11/26/22 1020   arformoterol (BROVANA) nebulizer solution 15 mcg, 15 mcg, Nebulization, QHS, Rodolph Bong, MD, 15 mcg at 11/25/22 1951   bisoprolol (ZEBETA) tablet 2.5 mg, 2.5 mg, Oral, Daily, Lindajo Royal V, MD, 2.5 mg at 11/26/22 1018   budesonide (PULMICORT) nebulizer solution 0.5 mg, 0.5 mg, Nebulization, BID, Rodolph Bong, MD, 0.5 mg at 11/26/22 0750   busPIRone (BUSPAR) tablet 15 mg, 15 mg, Oral, TID, Rodolph Bong, MD, 15 mg at 11/26/22 1021   cholecalciferol (VITAMIN D3) 25 MCG (1000 UNIT) tablet 2,000 Units, 2,000 Units, Oral, Daily, Andris Baumann, MD, 2,000 Units at 11/26/22 1020   cyanocobalamin (VITAMIN B12) tablet 5,000 mcg, 5,000 mcg, Oral, Daily, Lindajo Royal V, MD, 5,000 mcg at 11/26/22 1020   dextrose 50 % solution 25 mL, 25 mL, Intravenous, Once PRN, Enedina Finner, MD   ezetimibe (ZETIA) tablet 10 mg, 10 mg, Oral, Daily, Rodolph Bong, MD, 10 mg at 11/26/22 1020   ferrous sulfate tablet 325 mg, 325 mg, Oral, Daily, Lindajo Royal V, MD, 325 mg at 11/26/22 1020   FLUoxetine (PROZAC) capsule 20 mg, 20 mg, Oral, BID, Rodolph Bong, MD, 20 mg at 11/26/22 1018   fluticasone (FLONASE) 50 MCG/ACT nasal spray 2 spray, 2 spray, Each Nare, Daily, Rodolph Bong, MD, 2 spray at 11/26/22 1018   influenza vaccine adjuvanted (FLUAD) injection 0.5 mL, 0.5 mL, Intramuscular, Tomorrow-1000, Rodolph Bong, MD   isosorbide mononitrate (IMDUR) 24 hr tablet 15 mg, 15 mg, Oral, Daily, Rodolph Bong, MD, 15 mg at 11/26/22 1024   levalbuterol (XOPENEX) nebulizer solution 0.63 mg, 0.63 mg, Nebulization, Q2H PRN, Rodolph Bong, MD    levalbuterol Pauline Aus) nebulizer solution 0.63 mg, 0.63 mg, Nebulization, BID, Rodolph Bong, MD, 0.63 mg at 11/26/22 0750   levothyroxine (SYNTHROID) tablet 125 mcg, 125 mcg, Oral, Q0600, Andris Baumann, MD, 125 mcg at 11/26/22 7062   loratadine (CLARITIN) tablet 10 mg, 10 mg, Oral, Daily, Rodolph Bong, MD, 10 mg at 11/26/22 1020   nitroGLYCERIN (NITROSTAT) SL tablet 0.4 mg, 0.4 mg, Sublingual, Q5 min PRN, Andris Baumann, MD   ondansetron [facility]) tablet 4 mg, 4 mg, Oral, Q6H PRN **OR** ondansetron (ZOFRAN) injection 4 mg, 4 mg, Intravenous, Q6H PRN, Andris Baumann, MD   Oral care mouth rinse, 15 mL, Mouth Rinse, PRN, Rodolph Bong, MD   pantoprazole (PROTONIX) EC tablet 40 mg, 40 mg, Oral, Q0600, Rodolph Bong, MD, 40 mg at 11/26/22 0606   polyethylene glycol (MIRALAX / GLYCOLAX) packet 17 g, 17 g, Oral, Daily, Rodolph Bong, MD, 17 g at 11/26/22 1022   potassium chloride (KLOR-CON M) CR tablet 10 mEq, 10 mEq, Oral, Daily, Rodolph Bong, MD, 10 mEq at 11/26/22 1020   rosuvastatin (CRESTOR) tablet 10 mg, 10 mg, Oral, QODAY, Andris Baumann, MD, 10 mg at 11/26/22 1022   senna-docusate (Senokot-S) tablet 1 tablet, 1 tablet, Oral, QHS, Rodolph Bong, MD, 1 tablet at 11/25/22 2216   torsemide (DEMADEX) tablet 40 mg, 40 mg, Oral, Daily, Enedina Finner, MD, 40 mg at 11/26/22 1022  Facility-Administered Medications Ordered in Other Encounters:    0.9 %  sodium chloride infusion, , Intravenous, Once, Brahmanday, Govinda R, MD   0.9 %  sodium chloride infusion, , Intravenous, Continuous, Louretta Shorten R, MD, Last Rate: 20 mL/hr at 11/02/22 1421, New Bag at 11/02/22 1421    ALLERGIES  Antihistamines, diphenhydramine-type; Incruse ellipta [umeclidinium bromide]; Clarithromycin; Codeine; Diphenhydramine; Pregabalin; Sulfonamide derivatives; Tiotropium bromide monohydrate; Umeclidinium; and Vraylar [cariprazine]     REVIEW OF SYSTEMS    Review of  Systems:  Gen:  Denies  fever, sweats, chills weigh loss  HEENT: Denies blurred vision, double vision, ear pain, eye pain, hearing loss, nose bleeds, sore throat Cardiac:  No dizziness, chest pain or heaviness, chest tightness,edema Resp:   reports dyspnea chronically  Gi: Denies swallowing difficulty, stomach pain, nausea or vomiting, diarrhea, constipation, bowel incontinence Gu:  Denies bladder incontinence, burning urine Ext:   Denies Joint pain, stiffness or swelling Skin: Denies  skin rash, easy bruising or bleeding or hives Endoc:  Denies polyuria, polydipsia , polyphagia or weight change Psych:   Denies depression, insomnia or hallucinations   Other:  All other systems negative   VS: BP (!) 127/47 (BP Location: Left Arm)   Pulse (!) 57   Temp 98.2 F (36.8 C)   Resp 20   Ht 5\' 5"  (1.651 m)   Wt 115 kg   LMP  (LMP Unknown)   SpO2 95%   BMI 42.19 kg/m      PHYSICAL EXAM    GENERAL:NAD, no fevers, chills, no weakness no fatigue HEAD: Normocephalic, atraumatic.  EYES: Pupils equal, round, reactive to light. Extraocular muscles intact. No scleral icterus.  MOUTH: Moist mucosal membrane. Dentition intact. No abscess noted.  EAR, NOSE, THROAT: Clear without exudates. No external lesions.  NECK: Supple. No thyromegaly. No nodules. No JVD.  PULMONARY: decreased breath sounds with mild rhonchi worse at bases bilaterally.  CARDIOVASCULAR: S1 and S2. Regular rate and rhythm. No murmurs, rubs, or gallops. No edema. Pedal pulses 2+ bilaterally.  GASTROINTESTINAL: Soft, nontender, nondistended. No masses. Positive bowel sounds. No hepatosplenomegaly.  MUSCULOSKELETAL: No swelling, clubbing, or edema. Range of motion full in all extremities.  NEUROLOGIC: Cranial nerves II through XII are intact. No gross focal neurological deficits. Sensation intact. Reflexes intact.  SKIN: No ulceration, lesions, rashes, or cyanosis. Skin warm and dry. Turgor intact.  PSYCHIATRIC: Mood, affect  within normal limits. The patient is awake, alert and oriented x 3. Insight, judgment intact.       IMAGING  Reivewed CXR from today  ASSESSMENT/PLAN   Acute on chronic hypoxemic hypercarbic respiratory failure    Due to Acute COPD exacerbation with hypoventilation pickwikian overlap -patient is on steroids and antimicrobials -patient is already receiving supplemental O2 with goal spO2 89-94% -ABG to evaluate need for BIPAP support with hypercapnia -RVP to rule out viral etiology of current exacerbation -CRP for background inflammation level in context of AECOPD -patient normally sees pulmonary with Dr Craige Cotta.  -will continue to follow along with you -we had attempted to use CPAP but patient declined.  We discussed this and she states she will try again.  She may have OSA COPD overlap and may need to have PSG on outpatient for evaluation of hypoventilation and OSA.            Thank you for allowing me to participate in the care of this patient.   Patient/Family are satisfied with care plan and all questions have been answered.    Provider disclosure: Patient with at least one acute or chronic illness or injury that poses a threat to life or bodily function and is being managed actively during this encounter.  All of the below services have been performed independently by signing provider:  review of prior documentation from internal and or external health records.  Review of previous and current lab results.  Interview and comprehensive assessment during patient visit today. Review of current and previous chest radiographs/CT scans. Discussion of management and test interpretation with health care team and patient/family.   This document was prepared using Dragon voice recognition software and may include unintentional dictation errors.     Vida Rigger, M.D.  Division of Pulmonary & Critical Care Medicine

## 2022-11-26 NOTE — Consult Note (Signed)
[facility] Customer service manager [facility]) Accountable Care Organization (ACO) [facility] Hospital Liaison Note  11/26/2022  [name] March 10, 1935 [id number]  Location: [facility] RN Hospital Liaison screened the patient remotely at [facility]  Insurance:  Medicare   Alexandria [name] is a 87 y.o. female who is a Primary Care Patient of Para March, Dwana Curd, MD with [facility] [place]. The patient was screened for readmission hospitalization with noted extreme risk score for unplanned readmission risk with 2 IP in 6 months.  The patient was assessed for potential [facility]) Care Management service needs for post hospital transition for care coordination. Review of patient's electronic medical record reveals patient was admitted for acute on chronic blood loss anemia. Pt will be discharged today to SNF level of care for ongoing STR. Liaison will collaborate with the PAC-RN concerning pt's discharge disposition. Facility will continue to manage pt's ongoing needs post discharge.  Plan: [facility] Liaison will continue to follow progress and disposition to asess for post hospital community care coordination/management needs.  Referral request for community care coordination:  Will refer to the PAC-RN for community care management services when medical stable for discharge to his residence.    [facility] Care Management/Population Health does not replace or interfere with any arrangements made by the Inpatient Transition of Care team.   For questions contact:   Elliot Cousin, RN, [facility] Liaison [facility]   Population Health Office Hours MTWF  8:00 am-6:00 pm [phone] mobile [phone] [Office toll free line] Office Hours are M-F 8:30 - 5 pm [email or web address]

## 2022-11-26 NOTE — Progress Notes (Signed)
IV removed. EMS here to transport. Report called to facility. Patient being discharged in stable condition.  Cornell Barman Tama Grosz

## 2022-11-26 NOTE — Discharge Instructions (Signed)
Use your CPAP< oxygen, inhalers and nebs as instructed

## 2022-11-26 NOTE — TOC Transition Note (Signed)
Transition of Care [facility]) - CM/SW Discharge Note   Patient Details  Name: Alexandria [name] MRN: [id number] Date of Birth: [birth year]  Transition of Care [facility]) CM/SW Contact:  Darolyn Rua, LCSW Phone Number: 11/26/2022, 1:12 PM   Clinical Narrative:     Patient will DC EX:[id number] Anticipated DC date:9/20 Transport KG:[id number]  Per MD patient ready for DC to [facility]  RN, patient, patient's family, and facility notified of DC. Discharge Summary sent to facility. RN given number for report  [phone] Room 606. DC packet on chart. Ambulance transport requested for patient.  CSW signing off.    Final next level of care: Skilled Nursing Facility Barriers to Discharge: No Barriers Identified   Patient Goals and CMS Choice      Discharge Placement                         Discharge Plan and Services Additional resources added to the After Visit Summary for                                       Social Determinants of Health (SDOH) Interventions SDOH Screenings   Food Insecurity: No Food Insecurity (11/17/2022)  Housing: Low Risk  (11/17/2022)  Transportation Needs: No Transportation Needs (11/17/2022)  Utilities: Not At Risk (11/17/2022)  Alcohol Screen: Low Risk  (10/21/2022)  Depression (PHQ2-9): Low Risk  (10/21/2022)  Recent Concern: Depression (PHQ2-9) - Medium Risk (08/03/2022)  Financial Resource Strain: Low Risk  (10/21/2022)  Physical Activity: Insufficiently Active (10/21/2022)  Social Connections: Socially Isolated (10/21/2022)  Stress: No Stress Concern Present (10/21/2022)  Tobacco Use: Medium Risk (11/20/2022)  Health Literacy: Adequate Health Literacy (10/21/2022)     Readmission Risk Interventions    11/18/2022    4:47 PM  Readmission Risk Prevention Plan  Transportation Screening Complete  Medication Review (RN Care Manager) Complete  HRI or Home Care Consult Complete  SW Recovery Care/Counseling Consult Complete  Palliative Care Screening  Not Applicable  Skilled Nursing Facility Complete

## 2022-11-28 ENCOUNTER — Other Ambulatory Visit: Payer: Self-pay

## 2022-11-28 ENCOUNTER — Emergency Department: Payer: Medicare Other

## 2022-11-28 ENCOUNTER — Inpatient Hospital Stay
Admission: EM | Admit: 2022-11-28 | Discharge: 2022-12-02 | DRG: 177 | Disposition: A | Discharge status: Skilled Nursing Facility | Payer: Medicare Other | Source: Skilled Nursing Facility | Attending: Internal Medicine | Admitting: Internal Medicine

## 2022-11-28 DIAGNOSIS — I480 Paroxysmal atrial fibrillation: Secondary | ICD-10-CM | POA: Diagnosis present

## 2022-11-28 DIAGNOSIS — Z825 Family history of asthma and other chronic lower respiratory diseases: Secondary | ICD-10-CM

## 2022-11-28 DIAGNOSIS — E785 Hyperlipidemia, unspecified: Secondary | ICD-10-CM | POA: Diagnosis present

## 2022-11-28 DIAGNOSIS — U071 COVID-19: Secondary | ICD-10-CM | POA: Diagnosis not present

## 2022-11-28 DIAGNOSIS — R7989 Other specified abnormal findings of blood chemistry: Secondary | ICD-10-CM | POA: Diagnosis not present

## 2022-11-28 DIAGNOSIS — R0602 Shortness of breath: Principal | ICD-10-CM

## 2022-11-28 DIAGNOSIS — I130 Hypertensive heart and chronic kidney disease with heart failure and stage 1 through stage 4 chronic kidney disease, or unspecified chronic kidney disease: Secondary | ICD-10-CM | POA: Diagnosis present

## 2022-11-28 DIAGNOSIS — F418 Other specified anxiety disorders: Secondary | ICD-10-CM | POA: Diagnosis present

## 2022-11-28 DIAGNOSIS — E1122 Type 2 diabetes mellitus with diabetic chronic kidney disease: Secondary | ICD-10-CM | POA: Diagnosis present

## 2022-11-28 DIAGNOSIS — E1142 Type 2 diabetes mellitus with diabetic polyneuropathy: Secondary | ICD-10-CM | POA: Diagnosis present

## 2022-11-28 DIAGNOSIS — R4182 Altered mental status, unspecified: Secondary | ICD-10-CM | POA: Diagnosis not present

## 2022-11-28 DIAGNOSIS — Z6839 Body mass index (BMI) 39.0-39.9, adult: Secondary | ICD-10-CM

## 2022-11-28 DIAGNOSIS — I10 Essential (primary) hypertension: Secondary | ICD-10-CM | POA: Diagnosis not present

## 2022-11-28 DIAGNOSIS — J9622 Acute and chronic respiratory failure with hypercapnia: Secondary | ICD-10-CM | POA: Diagnosis present

## 2022-11-28 DIAGNOSIS — D509 Iron deficiency anemia, unspecified: Secondary | ICD-10-CM | POA: Diagnosis present

## 2022-11-28 DIAGNOSIS — Z883 Allergy status to other anti-infective agents status: Secondary | ICD-10-CM

## 2022-11-28 DIAGNOSIS — I5032 Chronic diastolic (congestive) heart failure: Secondary | ICD-10-CM | POA: Diagnosis not present

## 2022-11-28 DIAGNOSIS — Z85118 Personal history of other malignant neoplasm of bronchus and lung: Secondary | ICD-10-CM

## 2022-11-28 DIAGNOSIS — Z888 Allergy status to other drugs, medicaments and biological substances status: Secondary | ICD-10-CM

## 2022-11-28 DIAGNOSIS — Z7401 Bed confinement status: Secondary | ICD-10-CM | POA: Diagnosis not present

## 2022-11-28 DIAGNOSIS — E876 Hypokalemia: Secondary | ICD-10-CM | POA: Diagnosis present

## 2022-11-28 DIAGNOSIS — M6259 Muscle wasting and atrophy, not elsewhere classified, multiple sites: Secondary | ICD-10-CM | POA: Diagnosis not present

## 2022-11-28 DIAGNOSIS — J449 Chronic obstructive pulmonary disease, unspecified: Secondary | ICD-10-CM | POA: Diagnosis present

## 2022-11-28 DIAGNOSIS — Z7951 Long term (current) use of inhaled steroids: Secondary | ICD-10-CM

## 2022-11-28 DIAGNOSIS — Z881 Allergy status to other antibiotic agents status: Secondary | ICD-10-CM

## 2022-11-28 DIAGNOSIS — I2510 Atherosclerotic heart disease of native coronary artery without angina pectoris: Secondary | ICD-10-CM | POA: Diagnosis not present

## 2022-11-28 DIAGNOSIS — E1140 Type 2 diabetes mellitus with diabetic neuropathy, unspecified: Secondary | ICD-10-CM | POA: Diagnosis not present

## 2022-11-28 DIAGNOSIS — E669 Obesity, unspecified: Secondary | ICD-10-CM | POA: Diagnosis present

## 2022-11-28 DIAGNOSIS — Z8249 Family history of ischemic heart disease and other diseases of the circulatory system: Secondary | ICD-10-CM

## 2022-11-28 DIAGNOSIS — R2681 Unsteadiness on feet: Secondary | ICD-10-CM | POA: Diagnosis not present

## 2022-11-28 DIAGNOSIS — Z885 Allergy status to narcotic agent status: Secondary | ICD-10-CM

## 2022-11-28 DIAGNOSIS — N1832 Chronic kidney disease, stage 3b: Secondary | ICD-10-CM | POA: Diagnosis present

## 2022-11-28 DIAGNOSIS — Z79890 Hormone replacement therapy (postmenopausal): Secondary | ICD-10-CM | POA: Diagnosis not present

## 2022-11-28 DIAGNOSIS — J189 Pneumonia, unspecified organism: Secondary | ICD-10-CM | POA: Diagnosis not present

## 2022-11-28 DIAGNOSIS — Z90710 Acquired absence of both cervix and uterus: Secondary | ICD-10-CM

## 2022-11-28 DIAGNOSIS — E039 Hypothyroidism, unspecified: Secondary | ICD-10-CM | POA: Diagnosis present

## 2022-11-28 DIAGNOSIS — Z87891 Personal history of nicotine dependence: Secondary | ICD-10-CM

## 2022-11-28 DIAGNOSIS — K219 Gastro-esophageal reflux disease without esophagitis: Secondary | ICD-10-CM | POA: Diagnosis present

## 2022-11-28 DIAGNOSIS — I700 Atherosclerosis of aorta: Secondary | ICD-10-CM | POA: Diagnosis not present

## 2022-11-28 DIAGNOSIS — J9621 Acute and chronic respiratory failure with hypoxia: Secondary | ICD-10-CM | POA: Diagnosis not present

## 2022-11-28 DIAGNOSIS — M6281 Muscle weakness (generalized): Secondary | ICD-10-CM | POA: Diagnosis not present

## 2022-11-28 DIAGNOSIS — Z79899 Other long term (current) drug therapy: Secondary | ICD-10-CM

## 2022-11-28 DIAGNOSIS — E1151 Type 2 diabetes mellitus with diabetic peripheral angiopathy without gangrene: Secondary | ICD-10-CM | POA: Diagnosis present

## 2022-11-28 DIAGNOSIS — Z9981 Dependence on supplemental oxygen: Secondary | ICD-10-CM

## 2022-11-28 DIAGNOSIS — G4733 Obstructive sleep apnea (adult) (pediatric): Secondary | ICD-10-CM | POA: Diagnosis not present

## 2022-11-28 DIAGNOSIS — J9611 Chronic respiratory failure with hypoxia: Secondary | ICD-10-CM | POA: Diagnosis not present

## 2022-11-28 DIAGNOSIS — R0902 Hypoxemia: Secondary | ICD-10-CM | POA: Diagnosis not present

## 2022-11-28 DIAGNOSIS — Z882 Allergy status to sulfonamides status: Secondary | ICD-10-CM

## 2022-11-28 DIAGNOSIS — I517 Cardiomegaly: Secondary | ICD-10-CM | POA: Diagnosis not present

## 2022-11-28 DIAGNOSIS — J441 Chronic obstructive pulmonary disease with (acute) exacerbation: Secondary | ICD-10-CM | POA: Diagnosis not present

## 2022-11-28 DIAGNOSIS — T380X5A Adverse effect of glucocorticoids and synthetic analogues, initial encounter: Secondary | ICD-10-CM | POA: Diagnosis present

## 2022-11-28 DIAGNOSIS — J069 Acute upper respiratory infection, unspecified: Secondary | ICD-10-CM | POA: Diagnosis not present

## 2022-11-28 DIAGNOSIS — Z8349 Family history of other endocrine, nutritional and metabolic diseases: Secondary | ICD-10-CM

## 2022-11-28 DIAGNOSIS — Z9049 Acquired absence of other specified parts of digestive tract: Secondary | ICD-10-CM

## 2022-11-28 LAB — COMPREHENSIVE METABOLIC PANEL
ALT: 29 U/L (ref 0–44)
AST: 17 U/L (ref 15–41)
Albumin: 3.1 g/dL — ABNORMAL LOW (ref 3.5–5.0)
Alkaline Phosphatase: 97 U/L (ref 38–126)
Anion gap: 10 (ref 5–15)
BUN: 38 mg/dL — ABNORMAL HIGH (ref 8–23)
CO2: 37 mmol/L — ABNORMAL HIGH (ref 22–32)
Calcium: 8.4 mg/dL — ABNORMAL LOW (ref 8.9–10.3)
Chloride: 98 mmol/L (ref 98–111)
Creatinine, Ser: 1.33 mg/dL — ABNORMAL HIGH (ref 0.44–1.00)
GFR, Estimated: 39 mL/min — ABNORMAL LOW (ref >60)
Glucose, Bld: 137 mg/dL — ABNORMAL HIGH (ref 70–99)
Potassium: 4.1 mmol/L (ref 3.5–5.1)
Sodium: 145 mmol/L (ref 135–145)
Total Bilirubin: 0.7 mg/dL (ref 0.3–1.2)
Total Protein: 6.1 g/dL — ABNORMAL LOW (ref 6.5–8.1)

## 2022-11-28 LAB — CBC
HCT: 34.3 % — ABNORMAL LOW (ref 36.0–46.0)
Hemoglobin: 10.3 g/dL — ABNORMAL LOW (ref 12.0–15.0)
MCH: 31.8 pg (ref 26.0–34.0)
MCHC: 30 g/dL (ref 30.0–36.0)
MCV: 105.9 fL — ABNORMAL HIGH (ref 80.0–100.0)
Platelets: 151 10*3/uL (ref 150–400)
RBC: 3.24 MIL/uL — ABNORMAL LOW (ref 3.87–5.11)
RDW: 14.8 % (ref 11.5–15.5)
WBC: 8.9 10*3/uL (ref 4.0–10.5)
nRBC: 0.2 % (ref 0.0–0.2)

## 2022-11-28 LAB — BLOOD GAS, VENOUS
Acid-Base Excess: 13.3 mmol/L — ABNORMAL HIGH (ref 0.0–2.0)
Bicarbonate: 44.2 mmol/L — ABNORMAL HIGH (ref 20.0–28.0)
O2 Saturation: 46.9 %
Patient temperature: 37
pCO2, Ven: 92 mmHg (ref 44–60)
pH, Ven: 7.29 (ref 7.25–7.43)
pO2, Ven: 32 mmHg (ref 32–45)

## 2022-11-28 LAB — TROPONIN I (HIGH SENSITIVITY): Troponin I (High Sensitivity): 11 ng/L (ref <18)

## 2022-11-28 LAB — BRAIN NATRIURETIC PEPTIDE: B Natriuretic Peptide: 141.5 pg/mL — ABNORMAL HIGH (ref 0.0–100.0)

## 2022-11-28 LAB — SARS CORONAVIRUS 2 BY RT PCR: SARS Coronavirus 2 by RT PCR: POSITIVE — AB

## 2022-11-28 MED ORDER — HYDRALAZINE HCL 20 MG/ML IJ SOLN: 5.0000 mg | INTRAMUSCULAR | Status: DC | PRN

## 2022-11-28 MED ORDER — ALBUTEROL SULFATE (2.5 MG/3ML) 0.083% IN NEBU
2.5000 mg | INHALATION_SOLUTION | RESPIRATORY_TRACT | Status: DC
  Administered 2022-11-28 – 2022-11-29 (×2): 2.5 mg via RESPIRATORY_TRACT
  Filled 2022-11-28 (×2): qty 3

## 2022-11-28 MED ORDER — FUROSEMIDE 10 MG/ML IJ SOLN
40.0000 mg | Freq: Once | INTRAMUSCULAR | Status: AC
  Administered 2022-11-28: 40 mg via INTRAVENOUS
  Filled 2022-11-28: qty 4

## 2022-11-28 MED ORDER — ACETAMINOPHEN 325 MG PO TABS
650.0000 mg | ORAL_TABLET | Freq: Four times a day (QID) | ORAL | Status: DC | PRN
  Administered 2022-11-30 – 2022-12-01 (×2): 650 mg via ORAL
  Filled 2022-11-28 (×2): qty 2

## 2022-11-28 MED ORDER — ONDANSETRON HCL 4 MG/2ML IJ SOLN
4.0000 mg | Freq: Three times a day (TID) | INTRAMUSCULAR | Status: DC | PRN
  Administered 2022-11-28: 4 mg via INTRAVENOUS
  Filled 2022-11-28: qty 2

## 2022-11-28 MED ORDER — DM-GUAIFENESIN ER 30-600 MG PO TB12: 1.0000 | ORAL_TABLET | Freq: Two times a day (BID) | ORAL | Status: DC | PRN

## 2022-11-28 NOTE — ED Notes (Signed)
Pt caregiver at bedside

## 2022-11-28 NOTE — ED Provider Notes (Addendum)
[facility] Provider Note    Event Date/Time   First MD Initiated Contact with Patient 11/28/22 2024     (approximate)   History   Shortness of Breath   HPI  Alexandria [name] is a 87 y.o. female with a history of COPD, CHF and additional past medical history as detailed in the chart recently discharged 2 days ago to SNF, apparently was not discharged with CPAP machine and was found today to be hypoxic while sleeping.  She otherwise feels well and has no complaints.     Physical Exam   Triage Vital Signs: ED Triage Vitals  Encounter Vitals Group     BP 11/28/22 2027 (!) 144/41     Systolic BP Percentile --      Diastolic BP Percentile --      Pulse Rate 11/28/22 2027 (!) 59     Resp 11/28/22 2032 (!) 35     Temp 11/28/22 2032 98 F (36.7 C)     Temp Source 11/28/22 2032 Oral     SpO2 11/28/22 2027 99 %     Weight 11/28/22 2030 107.5 kg (236 lb 15.9 oz)     Height 11/28/22 2030 1.651 m (5\' 5" )     Head Circumference --      Peak Flow --      Pain Score 11/28/22 2030 0     Pain Loc --      Pain Education --      Exclude from Growth Chart --     Most recent vital signs: Vitals:   11/28/22 2130 11/28/22 2200  BP: (!) 128/45 (!) 115/49  Pulse: (!) 55 (!) 58  Resp: 20 (!) 25  Temp:    SpO2: 93% 97%     General: Awake, no distress.  CV:  Good peripheral perfusion.  Resp:  Normal effort.  Abd:  No distention.  Other:     ED Results / Procedures / Treatments   Labs (all labs ordered are listed, but only abnormal results are displayed) Labs Reviewed  CBC - Abnormal; Notable for the following components:      Result Value   RBC 3.24 (*)    Hemoglobin 10.3 (*)    HCT 34.3 (*)    MCV 105.9 (*)    All other components within normal limits  COMPREHENSIVE METABOLIC PANEL - Abnormal; Notable for the following components:   CO2 37 (*)    Glucose, Bld 137 (*)    BUN 38 (*)    Creatinine, Ser 1.33 (*)    Calcium 8.4 (*)    Total  Protein 6.1 (*)    Albumin 3.1 (*)    GFR, Estimated 39 (*)    All other components within normal limits  BRAIN NATRIURETIC PEPTIDE - Abnormal; Notable for the following components:   B Natriuretic Peptide 141.5 (*)    All other components within normal limits  BLOOD GAS, VENOUS - Abnormal; Notable for the following components:   pCO2, Ven 92 (*)    Bicarbonate 44.2 (*)    Acid-Base Excess 13.3 (*)    All other components within normal limits  EXPECTORATED SPUTUM ASSESSMENT W GRAM STAIN, RFLX TO RESP C  SARS CORONAVIRUS 2 BY RT PCR  BASIC METABOLIC PANEL  CBC  TROPONIN I (HIGH SENSITIVITY)     EKG  ED ECG REPORT I, Jene Every, the attending physician, personally viewed and interpreted this ECG.  Date: 11/28/2022  Rhythm: normal sinus rhythm QRS Axis:  normal Intervals: normal ST/T Wave abnormalities: normal Narrative Interpretation: no evidence of acute ischemia    RADIOLOGY X-ray without acute abnormality    PROCEDURES:  Critical Care performed: yes  CRITICAL CARE Performed by: Jene Every   Total critical care time: 30 minutes  Critical care time was exclusive of separately billable procedures and treating other patients.  Critical care was necessary to treat or prevent imminent or life-threatening deterioration.  Critical care was time spent personally by me on the following activities: development of treatment plan with patient and/or surrogate as well as nursing, discussions with consultants, evaluation of patient's response to treatment, examination of patient, obtaining history from patient or surrogate, ordering and performing treatments and interventions, ordering and review of laboratory studies, ordering and review of radiographic studies, pulse oximetry and re-evaluation of patient's condition.   Procedures   MEDICATIONS ORDERED IN ED: Medications  albuterol (PROVENTIL) (2.5 MG/3ML) 0.083% nebulizer solution 2.5 mg (has no administration  in time range)  dextromethorphan-guaiFENesin (MUCINEX DM) 30-600 MG per 12 hr tablet 1 tablet (has no administration in time range)  ondansetron (ZOFRAN) injection 4 mg (has no administration in time range)  hydrALAZINE (APRESOLINE) injection 5 mg (has no administration in time range)  acetaminophen (TYLENOL) tablet 650 mg (has no administration in time range)     IMPRESSION / MDM / ASSESSMENT AND PLAN / ED COURSE  I reviewed the triage vital signs and the nursing notes. Patient's presentation is most consistent with acute presentation with potential threat to life or bodily function.  Patient found to be hypoxic while sleeping only, when awake oxygen saturations are normal.  Differential includes CHF, COPD, hypercapnia, however likely related to simply not having her CPAP  However lab work found to be overall reassuring but VBG demonstrates a CO2 of 92, patient is obviously hypercapnic, will start her on BiPAP and admit her to the hospitalist service          FINAL CLINICAL IMPRESSION(S) / ED DIAGNOSES   Final diagnoses:  SOB (shortness of breath)  Acute and chronic respiratory failure with hypercapnia (HCC)     Rx / DC Orders   ED Discharge Orders     None        Note:  This document was prepared using Dragon voice recognition software and may include unintentional dictation errors.   Jene Every, MD 11/28/22 [id number]    Jene Every, MD 11/28/22 [phone]

## 2022-11-28 NOTE — ED Notes (Signed)
RT called for BIPAP placement.

## 2022-11-28 NOTE — ED Notes (Signed)
Per provider awaiting for pt daughter to arrive until treatment is done

## 2022-11-28 NOTE — H&P (Incomplete)
History and Physical    Alexandria [name] QIH:[id number] DOB: 1935-05-07 DOA: 11/28/2022  Referring MD/NP/PA:   PCP: Joaquim Nam, MD   Patient coming from:  The patient is coming from rehab   Chief Complaint: SOB  HPI: Alexandria [name] is a 87 y.o. female with medical history significant of COPD requiring CPAP, HTN, HDL, DM, CAD, PVD, dCHF, hypothyroidism, depression with anxiety, CKD-3, A-fib not on anticoagulants, obesity, who presents with shortness breath.  Patient was recently hospitalized from 9/10 - 9/20 due to acute on chronic respiratory failure secondary to COPD and CHF exacerbation.  Patient also had worsening anemia and required 2 unit of blood transfusion and IV iron infusion. Pt was discharged to rehab facility. Pt was supposed to be on CPAP, which is not available in facility.  Patient developed oxygen desaturation and shortness of breath.  She has moderate respiratory distress with tachypnea with respiration rate up to 35 usually.  Her O2 saturation was reportedly down to 85% per her friend at the bedside.  VBG showed pH 7.29, CO2 92, O2 32.   BiPAP is started in ED. patient has dry cough, no chest pain or shortness of breath.  Patient denies abdominal pain, diarrhea, nausea or vomiting.  No symptoms of UTI.  Patient has bilateral leg edema.  Patient is alert oriented x 3 when I saw patient in ED.  She moves all extremities normally.  Data reviewed independently and ED Course: pt was found to have BNP 141.5, stable renal function, WBC 8.9, temperature normal, blood pressure 115/49, heart rate 58.  Chest x-ray negative for infiltration, showed cardiomegaly.  Patient is placed on PCU for observation.   EKG: I have personally reviewed.  Sinus rhythm, QTc 484, low voltage, T wave inversion in lead I/aVL   Review of Systems:   General: no fevers, chills, no body weight gain, has fatigue HEENT: no blurry vision, hearing changes or sore throat Respiratory: has dyspnea, coughing,  no wheezing CV: no chest pain, no palpitations GI: no nausea, vomiting, abdominal pain, diarrhea, constipation GU: no dysuria, burning on urination, increased urinary frequency, hematuria  Ext: has leg edema Neuro: no unilateral weakness, numbness, or tingling, no vision change or hearing loss Skin: no rash, no skin tear. MSK: No muscle spasm, no deformity, no limitation of range of movement in spin Heme: No easy bruising.  Travel history: No recent long distant travel.   Allergy:  Allergies  Allergen Reactions   Antihistamines, Diphenhydramine-Type Other (See Comments)    Able to tolerate only allegra.    Incruse Ellipta [Umeclidinium Bromide] Cough    Caused voice change and severe coughing   Clarithromycin Other (See Comments)    REACTION: diff swallowing and mouth blisters   Codeine Other (See Comments)    Unknown    Diphenhydramine    Pregabalin Other (See Comments)    Lack of effect for neuropathy pain.   Sulfonamide Derivatives    Tiotropium Bromide Monohydrate Other (See Comments)    Voice changes   Umeclidinium    Vraylar [Cariprazine]     intolerant    Past Medical History:  Diagnosis Date   Allergy, unspecified not elsewhere classified    Anxiety    Aortic stenosis, mild 03/30/2022   a. TTE 03/29/2022: EF 55-60, no RWMA, GR 1 DD, normal RVSF, mild aortic stenosis (mean 10, V-max 212 cm/s, DI 0.70)   Cataract    Chronic diastolic congestive heart failure (HCC)    a. 06/2022 Echo: EF 60-65%,  no rwma, nl RV fxn, mild MR, mild AS.   COPD (chronic obstructive pulmonary disease) (HCC) 03/08/1998   PFTs 12/12/2002 FEV 1 1.42 (64%) ratio 58 with no better after B2 and DLCO75%; PFTs 11/20/09 FEV1 1.50 (73%) ratio 50 no better after B2 with DLCO 62%; Hfa 50% 11/20/2009 >75%, 01/13/10 p coaching   Coronary artery calcification seen on CT scan    a. 2012 Neg stress test.   Depression 12/07/1998   Diabetes mellitus type II 02/05/2006   Dr. Elvera Lennox with endo   Disorders of  bursae and tendons in shoulder region, unspecified    Rotator cuff syndrome, right   E. coli bacteremia    Esophagitis    GERD (gastroesophageal reflux disease)    History of UTI    Hyperlipidemia 10/04/2000   Hypertension 03/08/1992   Hypothyroidism 03/08/1968   Iron deficiency anemia    Lung nodule    radiation starts 10-08-2019   Malignant neoplasm of right upper lobe of lung (HCC) 07/24/2019   Obesity    NOS   OSA (obstructive sleep apnea)    PSG 01/27/10 AHI 13, pt does not know CPAP settings   PAF (paroxysmal atrial fibrillation) (HCC)    a. 06/2022 in setting of UTI/encephalopathy-->converted on amio; b. CHA2DS2VASc = 6-->No OAC 2/2 chronic anemia/fall risk; c. 06/2022 Zio: Predominantly sinus bradycardia at 54 (3-158)'s.  1 brief run of SVT.  2.2% PVC burden.   Peripheral neuropathy    Likely due to DM per Dr. Tresa Endo hernia     Past Surgical History:  Procedure Laterality Date   ABD U/S  03/19/1999   Nml x2 foci in liver   ADENOSINE MYOVIEW  06/02/2007   Nml   CARDIOLITE PERSANTINE  08/24/2000   Nml   CAROTID U/S  08/24/2000   1-39% ICA stenosis   CAROTID U/S  06/02/2007   No apprec change    CARPAL TUNNEL RELEASE  12/1997   Right   CESAREAN SECTION     x2 [place]/ repeat   CHOLECYSTECTOMY  1997   COLONOSCOPY WITH PROPOFOL N/A 02/12/2016   Procedure: COLONOSCOPY WITH PROPOFOL;  Surgeon: Rachael Fee, MD;  Location: [facility];  Service: Endoscopy;  Laterality: N/A;   CT ABD W & PELVIS WO/W CM     Abd hemangiomas of liver, 1 cm R renal cyst   DENTAL SURGERY  2016   Implants   DEXA  07/03/2003   Nml   ESOPHAGOGASTRODUODENOSCOPY  12/05/1997   Nml (due to hoarseness)   ESOPHAGOGASTRODUODENOSCOPY (EGD) WITH PROPOFOL N/A 02/12/2016   Procedure: ESOPHAGOGASTRODUODENOSCOPY (EGD) WITH PROPOFOL;  Surgeon: Rachael Fee, MD;  Location: [facility];  Service: Endoscopy;  Laterality: N/A;   GALLBLADDER SURGERY     HERNIA REPAIR  01/24/2009   Lap Ventr w/  Lysis of adhesions (Dr. Dwain Sarna)   knee arthroscopic surgery  years ago   right   ROTATOR CUFF REPAIR  1984   Right, [place]   SHOULDER OPEN ROTATOR CUFF REPAIR  02/08/2012   Procedure: ROTATOR CUFF REPAIR SHOULDER OPEN;  Surgeon: Drucilla Schmidt, MD;  Location: [facility];  Service: Orthopedics;  Laterality: Left;  Left Shoulder Open Anterior Acrominectomy Rotator Cuff Repair Open Distal Clavicle Resection ,tissue mend graft, and repair of biceps tendon   THUMB RELEASE  12/1997   Right   TONSILLECTOMY     TOTAL ABDOMINAL HYSTERECTOMY  1985   Due to dysmennorhea   US ECHOCARDIOGRAPHY  06/02/2007    Social History:  reports that she quit smoking about 17 years ago. Her smoking use included cigarettes. She started smoking about 67 years ago. She has a 100 pack-year smoking history. She has never used smokeless tobacco. She reports current alcohol use. She reports that she does not use drugs.  Family History:  Family History  Problem Relation Age of Onset   Heart disease Mother    Thyroid disease Mother    Emphysema Father        One lung   Cystic fibrosis Sister    Hyperthyroidism Sister    Osteoarthritis Brother    Hyperthyroidism Brother    Esophageal cancer Neg Hx    Cancer Neg Hx        Head or neck   Colon cancer Neg Hx    Stomach cancer Neg Hx    Breast cancer Neg Hx      Prior to Admission medications   Medication Sig Start Date End Date Taking? Authorizing Provider  acetaminophen (TYLENOL) 325 MG tablet Take 2 tablets (650 mg total) by mouth every 6 (six) hours as needed for mild pain, headache, fever or moderate pain. 07/07/22   Gillis Santa, MD  albuterol (PROAIR HFA) 108 (90 Base) MCG/ACT inhaler Inhale 2 puffs into the lungs every 6 (six) hours as needed for wheezing or shortness of breath. INHALE 2 PUFFS INTO THE LUNGS EVERY 6 HOURS AS NEEDED FOR WHEEZING OR SHORTNESS OF BREATH 04/19/18   Coralyn Helling, MD  amiodarone (PACERONE) 200 MG tablet TAKE 1 TABLET BY MOUTH  EVERY DAY 10/26/22   Creig Hines, NP  arformoterol (BROVANA) 15 MCG/2ML NEBU Take 2 mLs (15 mcg total) by nebulization at bedtime. 12/01/21   Coralyn Helling, MD  bisoprolol (ZEBETA) 5 MG tablet Take 0.5 tablets (2.5 mg total) by mouth daily. 08/06/22   Creig Hines, NP  budesonide (PULMICORT) 0.5 MG/2ML nebulizer solution Take 2 mLs (0.5 mg total) by nebulization in the morning and at bedtime. 10/22/22   Cobb, Ruby Cola, NP  busPIRone (BUSPAR) 15 MG tablet Take 15 mg by mouth 3 (three) times daily.    [provider]  Cholecalciferol (VITAMIN D) 50 MCG (2000 UT) tablet Take 1 tablet (2,000 Units total) by mouth daily. 06/27/18   Joaquim Nam, MD  Coenzyme Q-10 200 MG CAPS Take 200 mg by mouth daily.    [provider]  Cyanocobalamin (B-12) 5000 MCG CAPS Take 5,000 mcg by mouth daily.    [provider]  esomeprazole (NEXIUM) 40 MG capsule TAKE 1 CAPSULE (40 MG TOTAL) BY MOUTH DAILY AT 12 NOON. 10/26/22   Jenel Lucks, MD  ezetimibe (ZETIA) 10 MG tablet TAKE 1 TABLET BY MOUTH EVERYDAY AT BEDTIME 10/27/22   Joaquim Nam, MD  ferrous sulfate 325 (65 FE) MG tablet Take 1 tablet (325 mg total) by mouth daily. 09/15/22   Briant Cedar, PA-C  FLUoxetine (PROZAC) 20 MG tablet Take 20 mg by mouth 2 (two) times daily.    [provider]  fluticasone (FLONASE) 50 MCG/ACT nasal spray Place 2 sprays into both nostrils daily. 11/27/22   Enedina Finner, MD  glucose blood (ONETOUCH VERIO) test strip USE AS INSTRUCTED TO CHECK SUGAR 1 TIME DAILY 03/16/22   Carlus Pavlov, MD  guaiFENesin (MUCINEX) 600 MG 12 hr tablet Take 2 tablets (1,200 mg total) by mouth 2 (two) times daily as needed for cough or to loosen phlegm. 08/25/21   Coralyn Helling, MD  isosorbide mononitrate (IMDUR) 30 MG 24  hr tablet Take 30 mg by mouth daily. 08/07/22   [provider]  levothyroxine (SYNTHROID) 125 MCG tablet Take 1 tablet (125 mcg total) by mouth daily. 09/14/22    Carlus Pavlov, MD  loratadine (CLARITIN) 10 MG tablet Take 1 tablet (10 mg total) by mouth daily as needed for allergies. 11/26/22   Enedina Finner, MD  magnesium 30 MG tablet Take 1 tablet (30 mg total) by mouth 2 (two) times daily. 08/23/22   Antonieta Iba, MD  nitroGLYCERIN (NITROSTAT) 0.4 MG SL tablet Place 1 tablet (0.4 mg total) under the tongue every 5 (five) minutes as needed for chest pain. 07/16/21   Joaquim Nam, MD  OneTouch Delica Lancets 33G MISC Use 1x a day 03/16/22   Carlus Pavlov, MD  OXYGEN Inhale into the lungs. 2lpm    [provider]  polyethylene glycol (MIRALAX / GLYCOLAX) 17 g packet Take 17 g by mouth daily. 11/27/22   Enedina Finner, MD  potassium chloride (KLOR-CON) 10 MEQ tablet TAKE 1 TABLET BY MOUTH EVERY DAY 10/26/22   Antonieta Iba, MD  rosuvastatin (CRESTOR) 10 MG tablet TAKE 1 TABLET BY MOUTH EVERY OTHER DAY 07/30/22   Joaquim Nam, MD  senna-docusate (SENOKOT-S) 8.6-50 MG tablet Take 1 tablet by mouth at bedtime. 11/26/22   Enedina Finner, MD  Spacer/Aero Chamber Mouthpiece MISC Use with  inhaler as needed.  J44.9 09/30/17   Joaquim Nam, MD  torsemide (DEMADEX) 20 MG tablet Take 2 tablets (40 mg total) by mouth daily. Take an additional 20 mg daily as needed for weight greater then 230 pounds 08/24/22 11/22/22  Antonieta Iba, MD    Physical Exam: Vitals:   11/28/22 2035 11/28/22 2100 11/28/22 2130 11/28/22 2200  BP: (!) 138/57 (!) 144/49 (!) 128/45 (!) 115/49  Pulse: (!) 56 (!) 55 (!) 55 (!) 58  Resp: 17 19 20  (!) 25  Temp:      TempSrc:      SpO2: 100% 99% 93% 97%  Weight:      Height:       General: has moderate acute respiratory distress HEENT:       Eyes: PERRL, EOMI, no jaundice       ENT: No discharge from the ears and nose, no pharynx injection, no tonsillar enlargement.        Neck: No JVD, no bruit, no mass felt. Heme: No neck lymph node enlargement. Cardiac: S1/S2, RRR, No murmurs, No gallops or rubs. Respiratory: No  rales, wheezing, rhonchi or rubs. GI: Soft, nondistended, nontender, no rebound pain, no organomegaly, BS present. GU: No hematuria Ext: has 1+ pitting leg edema bilaterally. 1+DP/PT pulse bilaterally. Musculoskeletal: No joint deformities, No joint redness or warmth, no limitation of ROM in spin. Skin: No rashes.  Neuro: Alert, oriented X3, cranial nerves II-XII grossly intact, moves all extremities normally Psych: Patient is not psychotic, no suicidal or hemocidal ideation.  Labs on Admission: I have personally reviewed following labs and imaging studies  CBC: Recent Labs  Lab 11/22/22 0457 11/23/22 0736 11/24/22 0606 11/28/22 2108  WBC 6.1 6.7 7.5 8.9  HGB 10.1* 11.4* 11.0* 10.3*  HCT 32.3* 37.3 36.4 34.3*  MCV 100.9* 102.5* 104.0* 105.9*  PLT 163 174 148* 151   Basic Metabolic Panel: Recent Labs  Lab 11/22/22 0457 11/23/22 0736 11/24/22 0606 11/28/22 2108  NA 144 147* 143 145  K 3.4* 4.2 3.9 4.1  CL 98 97* 97* 98  CO2 35* 40* 33* 37*  GLUCOSE 104* 92 83 137*  BUN 46* 42* 36* 38*  CREATININE 1.21* 1.50* 1.23* 1.33*  CALCIUM 8.6* 8.7* 8.2* 8.4*  MG  --  2.5* 2.7*  --    GFR: Estimated Creatinine Clearance: 37.7 mL/min (A) (by C-G formula based on SCr of 1.33 mg/dL (H)). Liver Function Tests: Recent Labs  Lab 11/28/22 2108  AST 17  ALT 29  ALKPHOS 97  BILITOT 0.7  PROT 6.1*  ALBUMIN 3.1*   No results for input(s): "LIPASE", "AMYLASE" in the last 168 hours. No results for input(s): "AMMONIA" in the last 168 hours. Coagulation Profile: No results for input(s): "INR", "PROTIME" in the last 168 hours. Cardiac Enzymes: No results for input(s): "CKTOTAL", "CKMB", "CKMBINDEX", "TROPONINI" in the last 168 hours. BNP (last 3 results) Recent Labs    04/22/22 1624  PROBNP 69.0   HbA1C: No results for input(s): "HGBA1C" in the last 72 hours. CBG: Recent Labs  Lab 11/25/22 1024 11/25/22 1149 11/25/22 1656 11/25/22 2106 11/26/22 0825  GLUCAP 89 162* 178*  332* 102*   Lipid Profile: No results for input(s): "CHOL", "HDL", "LDLCALC", "TRIG", "CHOLHDL", "LDLDIRECT" in the last 72 hours. Thyroid Function Tests: No results for input(s): "TSH", "T4TOTAL", "FREET4", "T3FREE", "THYROIDAB" in the last 72 hours. Anemia Panel: No results for input(s): "VITAMINB12", "FOLATE", "FERRITIN", "TIBC", "IRON", "RETICCTPCT" in the last 72 hours. Urine analysis:    Component Value Date/Time   COLORURINE STRAW (A) 11/22/2022 1111   APPEARANCEUR CLEAR (A) 11/22/2022 1111   LABSPEC 1.006 11/22/2022 1111   PHURINE 7.0 11/22/2022 1111   GLUCOSEU NEGATIVE 11/22/2022 1111   GLUCOSEU NEGATIVE 08/30/2019 0919   HGBUR SMALL (A) 11/22/2022 1111   BILIRUBINUR NEGATIVE 11/22/2022 1111   BILIRUBINUR negative 09/23/2022 1444   BILIRUBINUR Negative 11/12/2021 1600   KETONESUR NEGATIVE 11/22/2022 1111   PROTEINUR NEGATIVE 11/22/2022 1111   UROBILINOGEN 0.2 09/23/2022 1444   UROBILINOGEN 0.2 08/30/2019 0919   NITRITE NEGATIVE 11/22/2022 1111   LEUKOCYTESUR NEGATIVE 11/22/2022 1111   Sepsis Labs: @LABRCNTIP (procalcitonin:4,lacticidven:4) ) Recent Results (from the past 240 hour(s))  Urine Culture (for pregnant, neutropenic or urologic patients or patients with an indwelling urinary catheter)     Status: Abnormal   Collection Time: 11/22/22 11:11 AM   Specimen: Urine, Catheterized  Result Value Ref Range Status   Specimen Description   Final    URINE, CATHETERIZED Performed at [facility], [address], [place], [place] [address]    Special Requests   Final    NONE Performed at [facility], [address], [place], [place] [address]    Culture (A)  Final    <10,000 COLONIES/mL INSIGNIFICANT GROWTH Performed at [facility], [address], [place], [place] [address]    Report Status 11/23/2022 FINAL  Final  SARS Coronavirus 2 by RT PCR (hospital order, performed in [facility] hospital lab) *cepheid single result test* Anterior  Nasal Swab     Status: None   Collection Time: 11/23/22  2:21 PM   Specimen: Anterior Nasal Swab  Result Value Ref Range Status   SARS Coronavirus 2 by RT PCR NEGATIVE NEGATIVE Final    Comment: (NOTE) SARS-CoV-2 target nucleic acids are NOT DETECTED.  The SARS-CoV-2 RNA is generally detectable in upper and lower respiratory specimens during the acute phase of infection. The lowest concentration of SARS-CoV-2 viral copies this assay can detect is 250 copies / mL. A negative result does not preclude SARS-CoV-2 infection and should not be used as the sole  basis for treatment or other patient management decisions.  A negative result may occur with improper specimen collection / handling, submission of specimen other than nasopharyngeal swab, presence of viral mutation(s) within the areas targeted by this assay, and inadequate number of viral copies (<250 copies / mL). A negative result must be combined with clinical observations, patient history, and epidemiological information.  Fact Sheet for Patients:   RoadLapTop.co.za  Fact Sheet for Healthcare Providers: http://kim-miller.com/  This test is not yet approved or  cleared by the [place] FDA and has been authorized for detection and/or diagnosis of SARS-CoV-2 by FDA under an Emergency Use Authorization (EUA).  This EUA will remain in effect (meaning this test can be used) for the duration of the COVID-19 declaration under Section 564(b)(1) of the Act, 21 U.S.C. section 360bbb-3(b)(1), unless the authorization is terminated or revoked sooner.  Performed at [facility], [address] [place], [place] [address]   Respiratory (~20 pathogens) panel by PCR     Status: None   Collection Time: 11/23/22  2:21 PM   Specimen: Nasopharyngeal Swab; Respiratory  Result Value Ref Range Status   Adenovirus NOT DETECTED NOT DETECTED Final   Coronavirus 229E NOT DETECTED NOT  DETECTED Final    Comment: (NOTE) The Coronavirus on the Respiratory Panel, DOES NOT test for the novel  Coronavirus (2019 nCoV)    Coronavirus HKU1 NOT DETECTED NOT DETECTED Final   Coronavirus NL63 NOT DETECTED NOT DETECTED Final   Coronavirus OC43 NOT DETECTED NOT DETECTED Final   Metapneumovirus NOT DETECTED NOT DETECTED Final   Rhinovirus / Enterovirus NOT DETECTED NOT DETECTED Final   Influenza A NOT DETECTED NOT DETECTED Final   Influenza B NOT DETECTED NOT DETECTED Final   Parainfluenza Virus 1 NOT DETECTED NOT DETECTED Final   Parainfluenza Virus 2 NOT DETECTED NOT DETECTED Final   Parainfluenza Virus 3 NOT DETECTED NOT DETECTED Final   Parainfluenza Virus 4 NOT DETECTED NOT DETECTED Final   Respiratory Syncytial Virus NOT DETECTED NOT DETECTED Final   Bordetella pertussis NOT DETECTED NOT DETECTED Final   Bordetella Parapertussis NOT DETECTED NOT DETECTED Final   Chlamydophila pneumoniae NOT DETECTED NOT DETECTED Final   Mycoplasma pneumoniae NOT DETECTED NOT DETECTED Final    Comment: Performed at [facility], [address] [place], [place] [address]  SARS Coronavirus 2 by RT PCR (hospital order, performed in [facility] hospital lab) *cepheid single result test* Anterior Nasal Swab     Status: Abnormal   Collection Time: 11/28/22 10:42 PM   Specimen: Anterior Nasal Swab  Result Value Ref Range Status   SARS Coronavirus 2 by RT PCR POSITIVE (A) NEGATIVE Final    Comment: (NOTE) SARS-CoV-2 target nucleic acids are DETECTED  SARS-CoV-2 RNA is generally detectable in upper respiratory specimens  during the acute phase of infection.  Positive results are indicative  of the presence of the identified virus, but do not rule out bacterial infection or co-infection with other pathogens not detected by the test.  Clinical correlation with patient history and  other diagnostic information is necessary to determine patient infection status.  The expected result is  negative.  Fact Sheet for Patients:   RoadLapTop.co.za   Fact Sheet for Healthcare Providers:   http://kim-miller.com/    This test is not yet approved or cleared by the [place] FDA and  has been authorized for detection and/or diagnosis of SARS-CoV-2 by FDA under an Emergency Use Authorization (EUA).  This EUA will  remain in effect (meaning this test can be used) for the duration of  the COVID-19 declaration under Section 564(b)(1)  of the Act, 21 U.S.C. section 360-bbb-3(b)(1), unless the authorization is terminated or revoked sooner.   Performed at [facility], [address], [place], [place] [address]      Radiological Exams on Admission: DG Chest [facility] 1 View  Result Date: 11/28/2022 CLINICAL DATA:  Shortness of breath EXAM: PORTABLE CHEST 1 VIEW COMPARISON:  11/23/2022 FINDINGS: Cardiomegaly with aortic atherosclerosis. No acute airspace disease or pleural effusion. No pneumothorax IMPRESSION: Cardiomegaly. Electronically Signed   By: Jasmine Pang M.D.   On: 11/28/2022 22:05      Assessment/Plan Principal Problem:   Acute on chronic respiratory failure with hypercapnia (HCC) Active Problems:   Chronic diastolic (congestive) heart failure (HCC)   COPD (chronic obstructive pulmonary disease) (HCC)   Hypothyroidism   Hyperlipemia   Essential hypertension   CAD (coronary artery disease)   Paroxysmal atrial fibrillation (HCC)   Stage 3b chronic kidney disease (HCC)   Depression with anxiety   Obesity (BMI 30-39.9)   Obstructive sleep apnea   Chronic diastolic CHF (congestive heart failure) (HCC)   Iron deficiency anemia   Assessment and Plan:  Principal Problem:   Acute on chronic respiratory failure with hypercapnia (HCC) Active Problems:   Chronic diastolic (congestive) heart failure (HCC)   COPD (chronic obstructive pulmonary disease) (HCC)   Hypothyroidism   Hyperlipemia   Essential  hypertension   CAD (coronary artery disease)   Paroxysmal atrial fibrillation (HCC)   Stage 3b chronic kidney disease (HCC)   Depression with anxiety   Obesity (BMI 30-39.9)   Obstructive sleep apnea   Chronic diastolic CHF (congestive heart failure) (HCC)   Iron deficiency anemia    DVT ppx: SCD  Code Status: Full code   Family Communication: Yes, patient's friends   at bed side.  I have also tried to call her daughter by phone who did not pick up the phone, I left a message.  Disposition Plan:  Anticipate discharge back to previous environment, rehab  Consults called: None  Admission status and Level of care: Progressive:  for obs    Dispo: The patient is from: SNF              Anticipated d/c is to: SNF              Anticipated d/c date is: 1 day              Patient currently is not medically stable to d/c.    Severity of Illness:  The appropriate patient status for this patient is OBSERVATION. Observation status is judged to be reasonable and necessary in order to provide the required intensity of service to ensure the patient's safety. The patient's presenting symptoms, physical exam findings, and initial radiographic and laboratory data in the context of their medical condition is felt to place them at decreased risk for further clinical deterioration. Furthermore, it is anticipated that the patient will be medically stable for discharge from the hospital within 2 midnights of admission.        Date of Service 11/28/2022    Lorretta Harp [facility]   If 7PM-7AM, please contact night-coverage [email or web address] 11/28/2022, 11:40 PM

## 2022-11-28 NOTE — ED Notes (Signed)
Caregiver at bedside with MD. Caregiver calling daughter on the phone to speak with provider

## 2022-11-28 NOTE — ED Triage Notes (Signed)
Pt BIB EMS from [facility] for [facility] only when sleeping at night. Pt was discharged yesterday and placed in rehab but the pt was not discharged with an APAP machine. Pt is on 2LPM O2 via [place] at baseline, SPO2 100%. Denies SHOB on arrival, pt is AAOX4, HOH.

## 2022-11-29 DIAGNOSIS — J441 Chronic obstructive pulmonary disease with (acute) exacerbation: Secondary | ICD-10-CM | POA: Diagnosis not present

## 2022-11-29 DIAGNOSIS — J189 Pneumonia, unspecified organism: Secondary | ICD-10-CM | POA: Diagnosis not present

## 2022-11-29 DIAGNOSIS — E039 Hypothyroidism, unspecified: Secondary | ICD-10-CM | POA: Diagnosis present

## 2022-11-29 DIAGNOSIS — Z87891 Personal history of nicotine dependence: Secondary | ICD-10-CM | POA: Diagnosis not present

## 2022-11-29 DIAGNOSIS — J449 Chronic obstructive pulmonary disease, unspecified: Secondary | ICD-10-CM | POA: Diagnosis present

## 2022-11-29 DIAGNOSIS — J9621 Acute and chronic respiratory failure with hypoxia: Secondary | ICD-10-CM | POA: Diagnosis present

## 2022-11-29 DIAGNOSIS — Z6839 Body mass index (BMI) 39.0-39.9, adult: Secondary | ICD-10-CM | POA: Diagnosis not present

## 2022-11-29 DIAGNOSIS — F418 Other specified anxiety disorders: Secondary | ICD-10-CM | POA: Diagnosis present

## 2022-11-29 DIAGNOSIS — U071 COVID-19: Secondary | ICD-10-CM | POA: Diagnosis present

## 2022-11-29 DIAGNOSIS — J9611 Chronic respiratory failure with hypoxia: Secondary | ICD-10-CM | POA: Diagnosis not present

## 2022-11-29 DIAGNOSIS — N1832 Chronic kidney disease, stage 3b: Secondary | ICD-10-CM | POA: Diagnosis present

## 2022-11-29 DIAGNOSIS — Z8249 Family history of ischemic heart disease and other diseases of the circulatory system: Secondary | ICD-10-CM | POA: Diagnosis not present

## 2022-11-29 DIAGNOSIS — D509 Iron deficiency anemia, unspecified: Secondary | ICD-10-CM | POA: Diagnosis present

## 2022-11-29 DIAGNOSIS — J9622 Acute and chronic respiratory failure with hypercapnia: Secondary | ICD-10-CM | POA: Diagnosis present

## 2022-11-29 DIAGNOSIS — I5032 Chronic diastolic (congestive) heart failure: Secondary | ICD-10-CM | POA: Diagnosis present

## 2022-11-29 DIAGNOSIS — E1151 Type 2 diabetes mellitus with diabetic peripheral angiopathy without gangrene: Secondary | ICD-10-CM | POA: Diagnosis present

## 2022-11-29 DIAGNOSIS — M6281 Muscle weakness (generalized): Secondary | ICD-10-CM | POA: Diagnosis not present

## 2022-11-29 DIAGNOSIS — Z79890 Hormone replacement therapy (postmenopausal): Secondary | ICD-10-CM | POA: Diagnosis not present

## 2022-11-29 DIAGNOSIS — R7989 Other specified abnormal findings of blood chemistry: Secondary | ICD-10-CM | POA: Diagnosis not present

## 2022-11-29 DIAGNOSIS — E1142 Type 2 diabetes mellitus with diabetic polyneuropathy: Secondary | ICD-10-CM | POA: Diagnosis present

## 2022-11-29 DIAGNOSIS — G4733 Obstructive sleep apnea (adult) (pediatric): Secondary | ICD-10-CM | POA: Diagnosis present

## 2022-11-29 DIAGNOSIS — Z7401 Bed confinement status: Secondary | ICD-10-CM | POA: Diagnosis not present

## 2022-11-29 DIAGNOSIS — E785 Hyperlipidemia, unspecified: Secondary | ICD-10-CM | POA: Diagnosis present

## 2022-11-29 DIAGNOSIS — E1122 Type 2 diabetes mellitus with diabetic chronic kidney disease: Secondary | ICD-10-CM | POA: Diagnosis present

## 2022-11-29 DIAGNOSIS — T380X5A Adverse effect of glucocorticoids and synthetic analogues, initial encounter: Secondary | ICD-10-CM | POA: Diagnosis present

## 2022-11-29 DIAGNOSIS — I480 Paroxysmal atrial fibrillation: Secondary | ICD-10-CM | POA: Diagnosis present

## 2022-11-29 DIAGNOSIS — M6259 Muscle wasting and atrophy, not elsewhere classified, multiple sites: Secondary | ICD-10-CM | POA: Diagnosis not present

## 2022-11-29 DIAGNOSIS — E876 Hypokalemia: Secondary | ICD-10-CM | POA: Diagnosis present

## 2022-11-29 DIAGNOSIS — J069 Acute upper respiratory infection, unspecified: Secondary | ICD-10-CM

## 2022-11-29 DIAGNOSIS — R4182 Altered mental status, unspecified: Secondary | ICD-10-CM | POA: Diagnosis not present

## 2022-11-29 DIAGNOSIS — R0602 Shortness of breath: Secondary | ICD-10-CM | POA: Diagnosis present

## 2022-11-29 DIAGNOSIS — I130 Hypertensive heart and chronic kidney disease with heart failure and stage 1 through stage 4 chronic kidney disease, or unspecified chronic kidney disease: Secondary | ICD-10-CM | POA: Diagnosis present

## 2022-11-29 DIAGNOSIS — I2510 Atherosclerotic heart disease of native coronary artery without angina pectoris: Secondary | ICD-10-CM | POA: Diagnosis present

## 2022-11-29 DIAGNOSIS — I10 Essential (primary) hypertension: Secondary | ICD-10-CM | POA: Diagnosis not present

## 2022-11-29 DIAGNOSIS — R0902 Hypoxemia: Secondary | ICD-10-CM | POA: Diagnosis not present

## 2022-11-29 DIAGNOSIS — E1140 Type 2 diabetes mellitus with diabetic neuropathy, unspecified: Secondary | ICD-10-CM | POA: Diagnosis not present

## 2022-11-29 DIAGNOSIS — E669 Obesity, unspecified: Secondary | ICD-10-CM | POA: Diagnosis present

## 2022-11-29 LAB — BASIC METABOLIC PANEL
Anion gap: 9 (ref 5–15)
BUN: 37 mg/dL — ABNORMAL HIGH (ref 8–23)
CO2: 37 mmol/L — ABNORMAL HIGH (ref 22–32)
Calcium: 8.3 mg/dL — ABNORMAL LOW (ref 8.9–10.3)
Chloride: 99 mmol/L (ref 98–111)
Creatinine, Ser: 1.33 mg/dL — ABNORMAL HIGH (ref 0.44–1.00)
GFR, Estimated: 39 mL/min — ABNORMAL LOW (ref >60)
Glucose, Bld: 203 mg/dL — ABNORMAL HIGH (ref 70–99)
Potassium: 4.4 mmol/L (ref 3.5–5.1)
Sodium: 145 mmol/L (ref 135–145)

## 2022-11-29 LAB — GLUCOSE, CAPILLARY: Glucose-Capillary: 321 mg/dL — ABNORMAL HIGH (ref 70–99)

## 2022-11-29 LAB — BLOOD GAS, ARTERIAL
Acid-Base Excess: 15 mmol/L — ABNORMAL HIGH (ref 0.0–2.0)
Bicarbonate: 43.6 mmol/L — ABNORMAL HIGH (ref 20.0–28.0)
Delivery systems: POSITIVE
Expiratory PAP: 5 cmH2O
FIO2: 40 %
Inspiratory PAP: 14 cmH2O
O2 Saturation: 97.4 %
Patient temperature: 37
pCO2 arterial: 72 mmHg (ref 32–48)
pH, Arterial: 7.39 (ref 7.35–7.45)
pO2, Arterial: 81 mmHg — ABNORMAL LOW (ref 83–108)

## 2022-11-29 LAB — CBC
HCT: 33 % — ABNORMAL LOW (ref 36.0–46.0)
Hemoglobin: 10 g/dL — ABNORMAL LOW (ref 12.0–15.0)
MCH: 31.8 pg (ref 26.0–34.0)
MCHC: 30.3 g/dL (ref 30.0–36.0)
MCV: 105.1 fL — ABNORMAL HIGH (ref 80.0–100.0)
Platelets: 153 10*3/uL (ref 150–400)
RBC: 3.14 MIL/uL — ABNORMAL LOW (ref 3.87–5.11)
RDW: 14.9 % (ref 11.5–15.5)
WBC: 9.7 10*3/uL (ref 4.0–10.5)
nRBC: 0 % (ref 0.0–0.2)

## 2022-11-29 LAB — LACTIC ACID, PLASMA: Lactic Acid, Venous: 0.8 mmol/L (ref 0.5–1.9)

## 2022-11-29 LAB — CBG MONITORING, ED: Glucose-Capillary: 184 mg/dL — ABNORMAL HIGH (ref 70–99)

## 2022-11-29 LAB — D-DIMER, QUANTITATIVE: D-Dimer, Quant: 0.71 ug/mL-FEU — ABNORMAL HIGH (ref 0.00–0.50)

## 2022-11-29 LAB — C-REACTIVE PROTEIN: CRP: 3.2 mg/dL — ABNORMAL HIGH (ref <1.0)

## 2022-11-29 MED ORDER — COENZYME Q-10 200 MG PO CAPS: 200.0000 mg | ORAL_CAPSULE | Freq: Every day | ORAL | Status: DC

## 2022-11-29 MED ORDER — METHYLPREDNISOLONE SODIUM SUCC 40 MG IJ SOLR: 40.0000 mg | Freq: Two times a day (BID) | INTRAMUSCULAR | Status: DC

## 2022-11-29 MED ORDER — ROSUVASTATIN CALCIUM 10 MG PO TABS
10.0000 mg | ORAL_TABLET | ORAL | Status: DC
  Administered 2022-11-30 – 2022-12-02 (×2): 10 mg via ORAL
  Filled 2022-11-29 (×2): qty 1

## 2022-11-29 MED ORDER — EZETIMIBE 10 MG PO TABS
10.0000 mg | ORAL_TABLET | Freq: Every day | ORAL | Status: DC
  Administered 2022-11-29 – 2022-12-01 (×3): 10 mg via ORAL
  Filled 2022-11-29 (×3): qty 1

## 2022-11-29 MED ORDER — VITAMIN C 500 MG PO TABS
500.0000 mg | ORAL_TABLET | Freq: Every day | ORAL | Status: DC
  Administered 2022-11-29 – 2022-12-02 (×4): 500 mg via ORAL
  Filled 2022-11-29 (×4): qty 1

## 2022-11-29 MED ORDER — PANTOPRAZOLE SODIUM 40 MG PO TBEC
40.0000 mg | DELAYED_RELEASE_TABLET | Freq: Every day | ORAL | Status: DC
  Administered 2022-11-29 – 2022-12-02 (×4): 40 mg via ORAL
  Filled 2022-11-29 (×4): qty 1

## 2022-11-29 MED ORDER — AMIODARONE HCL 200 MG PO TABS
200.0000 mg | ORAL_TABLET | Freq: Every day | ORAL | Status: DC
  Administered 2022-11-29 – 2022-12-02 (×4): 200 mg via ORAL
  Filled 2022-11-29 (×4): qty 1

## 2022-11-29 MED ORDER — SENNOSIDES-DOCUSATE SODIUM 8.6-50 MG PO TABS
1.0000 | ORAL_TABLET | Freq: Every day | ORAL | Status: DC
  Administered 2022-11-29 – 2022-12-01 (×3): 1 via ORAL
  Filled 2022-11-29 (×3): qty 1

## 2022-11-29 MED ORDER — ISOSORBIDE MONONITRATE ER 30 MG PO TB24
30.0000 mg | ORAL_TABLET | Freq: Every day | ORAL | Status: DC
  Administered 2022-11-29 – 2022-12-02 (×4): 30 mg via ORAL
  Filled 2022-11-29 (×4): qty 1

## 2022-11-29 MED ORDER — VITAMIN D 25 MCG (1000 UNIT) PO TABS
2000.0000 [IU] | ORAL_TABLET | Freq: Every day | ORAL | Status: DC
  Administered 2022-11-29 – 2022-12-02 (×4): 2000 [IU] via ORAL
  Filled 2022-11-29 (×4): qty 2

## 2022-11-29 MED ORDER — INSULIN ASPART 100 UNIT/ML IJ SOLN
0.0000 [IU] | Freq: Three times a day (TID) | INTRAMUSCULAR | Status: DC
  Administered 2022-11-30: 20 [IU] via SUBCUTANEOUS
  Administered 2022-11-30 – 2022-12-01 (×5): 4 [IU] via SUBCUTANEOUS
  Administered 2022-12-02: 11 [IU] via SUBCUTANEOUS
  Filled 2022-11-29 (×7): qty 1

## 2022-11-29 MED ORDER — ARFORMOTEROL TARTRATE 15 MCG/2ML IN NEBU
15.0000 ug | INHALATION_SOLUTION | Freq: Every day | RESPIRATORY_TRACT | Status: DC
  Filled 2022-11-29: qty 2

## 2022-11-29 MED ORDER — ALBUTEROL SULFATE (2.5 MG/3ML) 0.083% IN NEBU: 2.5000 mg | INHALATION_SOLUTION | RESPIRATORY_TRACT | Status: DC | PRN

## 2022-11-29 MED ORDER — VITAMIN B-12 1000 MCG PO TABS
2000.0000 ug | ORAL_TABLET | Freq: Every day | ORAL | Status: DC
  Administered 2022-11-29 – 2022-12-02 (×4): 2000 ug via ORAL
  Filled 2022-11-29 (×4): qty 2

## 2022-11-29 MED ORDER — BISOPROLOL FUMARATE 5 MG PO TABS
2.5000 mg | ORAL_TABLET | Freq: Every day | ORAL | Status: DC
  Administered 2022-11-29 – 2022-12-02 (×4): 2.5 mg via ORAL
  Filled 2022-11-29 (×4): qty 0.5

## 2022-11-29 MED ORDER — BUDESONIDE 0.5 MG/2ML IN SUSP
0.5000 mg | Freq: Two times a day (BID) | RESPIRATORY_TRACT | Status: DC
  Administered 2022-11-29: 0.5 mg via RESPIRATORY_TRACT
  Filled 2022-11-29 (×2): qty 2

## 2022-11-29 MED ORDER — ARFORMOTEROL TARTRATE 15 MCG/2ML IN NEBU
15.0000 ug | INHALATION_SOLUTION | Freq: Every day | RESPIRATORY_TRACT | Status: DC
  Filled 2022-11-29 (×2): qty 2

## 2022-11-29 MED ORDER — LORATADINE 10 MG PO TABS: 10.0000 mg | ORAL_TABLET | Freq: Every day | ORAL | Status: DC | PRN

## 2022-11-29 MED ORDER — ALBUTEROL SULFATE (2.5 MG/3ML) 0.083% IN NEBU
2.5000 mg | INHALATION_SOLUTION | Freq: Four times a day (QID) | RESPIRATORY_TRACT | Status: DC
  Administered 2022-11-29 – 2022-11-30 (×2): 2.5 mg via RESPIRATORY_TRACT
  Filled 2022-11-29 (×2): qty 3

## 2022-11-29 MED ORDER — ZINC SULFATE 220 (50 ZN) MG PO CAPS
220.0000 mg | ORAL_CAPSULE | Freq: Every day | ORAL | Status: DC
  Administered 2022-11-29 – 2022-12-02 (×4): 220 mg via ORAL
  Filled 2022-11-29 (×4): qty 1

## 2022-11-29 MED ORDER — FERROUS SULFATE 325 (65 FE) MG PO TABS
325.0000 mg | ORAL_TABLET | Freq: Every day | ORAL | Status: DC
  Administered 2022-11-29 – 2022-12-02 (×4): 325 mg via ORAL
  Filled 2022-11-29 (×4): qty 1

## 2022-11-29 MED ORDER — FLUOXETINE HCL 20 MG PO CAPS
20.0000 mg | ORAL_CAPSULE | Freq: Two times a day (BID) | ORAL | Status: DC
  Administered 2022-11-29 – 2022-12-02 (×7): 20 mg via ORAL
  Filled 2022-11-29 (×7): qty 1

## 2022-11-29 MED ORDER — NITROGLYCERIN 0.4 MG SL SUBL: 0.4000 mg | SUBLINGUAL_TABLET | SUBLINGUAL | Status: DC | PRN

## 2022-11-29 MED ORDER — POLYETHYLENE GLYCOL 3350 17 G PO PACK
17.0000 g | PACK | Freq: Every day | ORAL | Status: DC
  Administered 2022-11-30 – 2022-12-02 (×3): 17 g via ORAL
  Filled 2022-11-29 (×3): qty 1

## 2022-11-29 MED ORDER — BUSPIRONE HCL 10 MG PO TABS
15.0000 mg | ORAL_TABLET | Freq: Three times a day (TID) | ORAL | Status: DC
  Administered 2022-11-29 – 2022-12-02 (×10): 15 mg via ORAL
  Filled 2022-11-29 (×2): qty 2
  Filled 2022-11-29: qty 3
  Filled 2022-11-29 (×3): qty 2
  Filled 2022-11-29: qty 3
  Filled 2022-11-29 (×3): qty 2

## 2022-11-29 MED ORDER — ENOXAPARIN SODIUM 60 MG/0.6ML IJ SOSY
0.5000 mg/kg | PREFILLED_SYRINGE | INTRAMUSCULAR | Status: DC
  Administered 2022-11-30 – 2022-12-01 (×2): 55 mg via SUBCUTANEOUS
  Filled 2022-11-29 (×3): qty 0.6

## 2022-11-29 MED ORDER — METHYLPREDNISOLONE SODIUM SUCC 125 MG IJ SOLR
80.0000 mg | Freq: Two times a day (BID) | INTRAMUSCULAR | Status: DC
  Administered 2022-11-29 (×2): 80 mg via INTRAVENOUS
  Filled 2022-11-29 (×2): qty 2

## 2022-11-29 MED ORDER — LEVOTHYROXINE SODIUM 25 MCG PO TABS
125.0000 ug | ORAL_TABLET | Freq: Every day | ORAL | Status: DC
  Administered 2022-11-29 – 2022-12-02 (×4): 125 ug via ORAL
  Filled 2022-11-29 (×2): qty 1
  Filled 2022-11-29: qty 3
  Filled 2022-11-29: qty 1

## 2022-11-29 MED ORDER — TORSEMIDE 20 MG PO TABS
40.0000 mg | ORAL_TABLET | Freq: Every day | ORAL | Status: DC
  Administered 2022-11-29 – 2022-12-02 (×4): 40 mg via ORAL
  Filled 2022-11-29 (×4): qty 2

## 2022-11-29 MED ORDER — INSULIN ASPART 100 UNIT/ML IJ SOLN
0.0000 [IU] | Freq: Three times a day (TID) | INTRAMUSCULAR | Status: DC
  Administered 2022-11-29: 2 [IU] via SUBCUTANEOUS
  Filled 2022-11-29: qty 1

## 2022-11-29 NOTE — TOC Progression Note (Signed)
Transition of Care [facility]) - Progression Note    Patient Details  Name: Alexandria [name] MRN: [id number] Date of Birth: [birth year]  Transition of Care [facility]) CM/SW Contact  Darolyn Rua, [place] Phone Number: 11/29/2022, 11:48 AM  Clinical Narrative:     CSW notes patient readmitted from [facility]. CSW spoke with [facility] admissions Thursday 9/19 and faxed settings, they had ordered cpap and texted this CSW on 9/20 stating that CPAP had arrived and to send patient to facility, they provided a room number and call for report.   Today 9/23 CSW received call from patient daughter stating that the dme was not at facility and they reported still needing settings, updated setting emailed to facility and to patient daughter. CSW has also notified TOC supervisor of the discrepancies in [place] reporting the cpap was at facility last Friday and patient being readmitted to ED.   TOC supervisor aware.        Expected Discharge Plan and Services                                               Social Determinants of Health (SDOH) Interventions SDOH Screenings   Food Insecurity: No Food Insecurity (11/17/2022)  Housing: Low Risk  (11/17/2022)  Transportation Needs: No Transportation Needs (11/17/2022)  Utilities: Not At Risk (11/17/2022)  Alcohol Screen: Low Risk  (10/21/2022)  Depression (PHQ2-9): Low Risk  (10/21/2022)  Recent Concern: Depression (PHQ2-9) - Medium Risk (08/03/2022)  Financial Resource Strain: Low Risk  (10/21/2022)  Physical Activity: Insufficiently Active (10/21/2022)  Social Connections: Socially Isolated (10/21/2022)  Stress: No Stress Concern Present (10/21/2022)  Tobacco Use: Medium Risk (11/28/2022)  Health Literacy: Adequate Health Literacy (10/21/2022)    Readmission Risk Interventions    11/18/2022    4:47 PM  Readmission Risk Prevention Plan  Transportation Screening Complete  Medication Review (RN Care Manager) Complete  HRI or Home Care Consult Complete   SW Recovery Care/Counseling Consult Complete  Palliative Care Screening Not Applicable  Skilled Nursing Facility Complete

## 2022-11-29 NOTE — Assessment & Plan Note (Signed)
--  IV hydralazine as needed --Torsemide, bisoprolol

## 2022-11-29 NOTE — Assessment & Plan Note (Signed)
Synthroid

## 2022-11-29 NOTE — Progress Notes (Signed)
   11/29/22 0829  BiPAP/CPAP/SIPAP  BiPAP/CPAP/SIPAP Pt Type Adult  BiPAP/CPAP/SIPAP V60  Mask Type Full face mask  Mask Size Large  Set Rate 12 breaths/min  Respiratory Rate 18 breaths/min  IPAP 20 cmH20  EPAP (S)  8 cmH2O  FiO2 (%) 40 %  Minute Ventilation 9  Leak 0  Peak Inspiratory Pressure (PIP) 20  Tidal Volume (Vt) 492  Patient Home Equipment No  Auto Titrate No  Press High Alarm 25 cmH2O  Press Low Alarm 2 cmH2O  BiPAP/CPAP /SiPAP Vitals  Pulse Rate 60  Resp 18  SpO2 96 %  Bilateral Breath Sounds Diminished;Expiratory wheezes  MEWS Score/Color  MEWS Score 0  MEWS Score Color Darlyn Read, [place], MSW, [place] [phone]

## 2022-11-29 NOTE — TOC Progression Note (Signed)
Transition of Care [facility]) - Progression Note    Patient Details  Name: Alexandria [name] MRN: [id number] Date of Birth: Feb 22, 1936  Transition of Care [facility]) CM/SW Contact  Colette Ribas, [place] Phone Number: 11/29/2022, 11:47 AM  Clinical Narrative:      CSW spoke with [facility], they advised they had to send the patient back from SNF due to them having the incorrect CPAP, they advised her oxygen levels got to low so they had to send her back to the ED but once they get the correct CPAP levels they'll re-admit her.      Expected Discharge Plan and Services                                               Social Determinants of Health (SDOH) Interventions SDOH Screenings   Food Insecurity: No Food Insecurity (11/17/2022)  Housing: Low Risk  (11/17/2022)  Transportation Needs: No Transportation Needs (11/17/2022)  Utilities: Not At Risk (11/17/2022)  Alcohol Screen: Low Risk  (10/21/2022)  Depression (PHQ2-9): Low Risk  (10/21/2022)  Recent Concern: Depression (PHQ2-9) - Medium Risk (08/03/2022)  Financial Resource Strain: Low Risk  (10/21/2022)  Physical Activity: Insufficiently Active (10/21/2022)  Social Connections: Socially Isolated (10/21/2022)  Stress: No Stress Concern Present (10/21/2022)  Tobacco Use: Medium Risk (11/28/2022)  Health Literacy: Adequate Health Literacy (10/21/2022)    Readmission Risk Interventions    11/18/2022    4:47 PM  Readmission Risk Prevention Plan  Transportation Screening Complete  Medication Review (RN Care Manager) Complete  HRI or Home Care Consult Complete  SW Recovery Care/Counseling Consult Complete  Palliative Care Screening Not Applicable  Skilled Nursing Facility Complete

## 2022-11-29 NOTE — Assessment & Plan Note (Signed)
-  Continue home medications

## 2022-11-29 NOTE — Progress Notes (Signed)
Progress Note   Patient: Alexandria [name]:[id number] DOB: 09-01-1935 DOA: 11/28/2022     0 DOS: the patient was seen and examined on 11/29/2022   Brief hospital course: HPI on admission 11/28/2022:  "Alexandria [name] is a 87 y.o. female with medical history significant of COPD requiring CPAP, HTN, HDL, DM, CAD, PVD, dCHF, hypothyroidism, depression with anxiety, CKD-3, A-fib not on anticoagulants, obesity, who presents with shortness breath."    See H&P for full HPI on admission and ED course.   Patient was admitted for acute on chronic respiratory failure with hypoxia and hypercapnia due to Covid-19 infection.  No evidence pneumonia on imaging. She was placed on BiPAP, started on IV steroids, given one dose IV Lasix.  Further hospital course and management as outlined below.  9/24 - notified by ED RN that pt's daughter declining IV steroids, stating "she just finished steroids".  RN also reports O2 desaturations down to 60's when briefly attempted off BiPAP on nasal cannula oxygen.  Patient's blood gas is improved from pH 7.29 >> 7.39 and pCO2 92 >> 72.  No mental status changes.    Daughter spoke with someone on her phone about two medications for treating Covid, then asked for me to start either ivermectin or hydroxychloroquine.  I explained that these are not treatments used for Covid, and have no data to support their use.  She declines remdesevir or Paxlovid, citing side effects or harms she's heard of.  Discussed in detail that steroids have the most data to support use in Covid with acute hypoxic respiratory failure both with or without pneumonia.  She ultimately agreed to IV steroids being continued.  She continued to press for starting off-label treatment as stated above, and asked for second opinion from Pulmonology, as Dr. Karna Christmas had previously seen patient during her recent admission.   Assessment and Plan:  * Acute on chronic respiratory failure with hypoxia and hypercapnia  (HCC) Covid-19 infection (without pneumonia on imaging on admission). Baseline O2 need is 2 L/min. 9/24 - RN reported spO2 desat into 60's when tried off BiPAP on [place] O[place] --Continue BiPAP PRN --Maintain spO2 > 88% --Pulmonology consulted at daughter's request --Will defer to Pulmonology on any further Covid-specific agents.  Will not order ivermectin or hydroxychloroquine. --Check CRP & lactate --Continue IV steroids (Solu-medrol 80 mg BID) --Anticipate steroid-induced hyperglycemia - start sliding scale Novolog and adjust for inpatient goal CBG 140-180 --Diuresis as needed (one time IV lasix given on admission, home torsemide resumed) --Repeat Chest xray if worsening respiratory status or O2 requirements --Repeat VBG with AM labs --Symptomatic care per orders --Lovenox for DVT prophylaxis  Acute respiratory disease due to COVID-19 virus .  COPD (chronic obstructive pulmonary disease) (HCC) Stable, not wheezing on exam.  Daughter reports pt recently completed steroids. --On IV steroids for Covid-19 with acute hypoxia as above --Pulmonology consulted --Scheduled and PRN albuterol nebs --Brovana & Pulmicort nebs  Chronic diastolic (congestive) heart failure (HCC) 2D echo on 06/30/2022 showed EF 60 to 65%.  BNP 141.5 on admission.  No pulm edema chest x-ray. Does have BLE edema, slightly worse on the right. --Gived 1 dose of IV Lasix 40 mg on admission --Continue home torsemide --Daily weights to monitor volume status --Monitor renal function, electrolytes --Check D-Dimer and if positive -- will get lower extremity doppler U/S to rule out DVT, given higher risk with Covid infection   Hyperlipemia --Crestor  Hypothyroidism --Synthroid  Essential hypertension --IV hydralazine as needed --Torsemide, bisoprolol  CAD (  coronary artery disease) Stable, no chest pain. Hs-troponin normal 11. --Crestor, Imdur  --Obtain stat EKG and check troponin if active chest pain   Paroxysmal  atrial fibrillation (HCC) HR in 50's --On bisoprolol --Not on anticoagulation due to anemia and high falls risk --Lovenox for VTE ppx in setting of Covid infection  Stage 3b chronic kidney disease (HCC) Stable. --Monitor BMP  Depression with anxiety --Continue home medications   Obesity (BMI 30-39.9) Body mass index is 39.44 kg/m. Complicates overall care and prognosis.  Recommend lifestyle modifications including physical activity and diet for weight loss and overall long-term health. Contributes to her chronic respiratory failure and increases risk of covid-19 complications.  Obstructive sleep apnea --Currently on BiPAP --Auto CPAP machine ordered at SNF upon d/c last time, but apparently was not at the facility. --[facility] is following to ensure machine at SNF --Pulmonology following at daughter's request  Iron deficiency anemia Hbg stable at 10.0. Note pt required blood transfusion during recent admission. --Monitor Hbg --Transfusion threshold 7        Subjective: Pt seen in the ED holding for a bed.  See above for my discussion with daughter regarding medical management decisions.   Pt remains on BiPAP, alert & responsive.  Denies feeling short of breath or other acute complaints at this time.  Family and friend/caregiver at bedside report her leg swelling is improved from usual, but want to place her compression stockings to help with swelling.   Physical Exam: Vitals:   11/29/22 1115 11/29/22 1200 11/29/22 1255 11/29/22 1300  BP:  (!) 131/54  (!) 122/49  Pulse:    (!) 53  Resp:  19  19  Temp:      TempSrc:      SpO2: 97%  97% 97%  Weight:      Height:       General exam: awake, alert, no acute distress, obese HEENT: wearing BiPAP mask, hearing grossly normal  Respiratory system: CTAB but generally diminished, no expiratory wheezes or rhonchi, on BiPAP at 20/8 FiO2 40% with spO2 98%. Cardiovascular system: normal S1/S2, RRR, unable to visualize JVD due to body  habitus, Right more than left LE edema.   Gastrointestinal system: soft, NT, ND, no HSM felt, +bowel sounds. Central nervous system: A&O x 3. no gross focal neurologic deficits, normal speech Skin: dry, intact, normal temperature Psychiatry: normal mood, congruent affect   Data Reviewed:  Notable labs --- VBG improved pH 7.29 >> 7.39 and pCO2 92 >> 72 Bicarb 37, glucose 203, BUN 37, Cr 1.33, Ca 8.3, Hbg stable 10.0, normal WBC  Family Communication: daughter and another person at bedside on rounds  Disposition: Status is: Inpatient Remains inpatient appropriate because: severity of illness with ongoing respiratory failure as above, on IV therapies    Planned Discharge Destination: Skilled nursing facility    Time spent: 65 minutes including time at bedside, reviewing chart and patient history and in coordination of care with staff and consultants.  Author: Pennie Banter, DO 11/29/2022 3:01 PM  For on call review [email or web address].

## 2022-11-29 NOTE — Assessment & Plan Note (Signed)
Stable.  Monitor BMP.

## 2022-11-29 NOTE — Assessment & Plan Note (Signed)
Body mass index is 39.44 kg/m. Complicates overall care and prognosis.  Recommend lifestyle modifications including physical activity and diet for weight loss and overall long-term health. Contributes to her chronic respiratory failure and increases risk of covid-19 complications.

## 2022-11-29 NOTE — Assessment & Plan Note (Signed)
-  Crestor

## 2022-11-29 NOTE — Assessment & Plan Note (Signed)
--  Currently on BiPAP --Auto CPAP machine ordered at SNF upon d/c last time, but apparently was not at the facility. --[facility] is following to ensure machine at SNF --Pulmonology following at daughter's request

## 2022-11-29 NOTE — Assessment & Plan Note (Addendum)
Covid-19 infection (without pneumonia on imaging on admission). Baseline O2 need is 2 L/min. 9/24 - RN reported spO2 desat into 60's when tried off BiPAP on [place] O2 --Continue BiPAP PRN --Maintain spO2 > 88% --Pulmonology consulted at daughter's request --Will defer to Pulmonology on any further Covid-specific agents.  Will not order ivermectin or hydroxychloroquine. --Check CRP & lactate --Continue IV steroids (Solu-medrol 80 mg BID) --Anticipate steroid-induced hyperglycemia - start sliding scale Novolog and adjust for inpatient goal CBG 140-180 --Diuresis as needed (one time IV lasix given on admission, home torsemide resumed) --Repeat Chest xray if worsening respiratory status or O2 requirements --Repeat VBG with AM labs --Symptomatic care per orders --Lovenox for DVT prophylaxis

## 2022-11-29 NOTE — Assessment & Plan Note (Signed)
HR in 50's --On bisoprolol --Not on anticoagulation due to anemia and high falls risk --Lovenox for VTE ppx in setting of Covid infection

## 2022-11-29 NOTE — Progress Notes (Signed)
ANTICOAGULATION CONSULT NOTE - Initial Consult  Pharmacy Consult for Enoxaparin Indication: VTE prophylaxis  Allergies  Allergen Reactions   Antihistamines, Diphenhydramine-Type Other (See Comments)    Able to tolerate only allegra.    Incruse Ellipta [Umeclidinium Bromide] Cough    Caused voice change and severe coughing   Clarithromycin Other (See Comments)    REACTION: diff swallowing and mouth blisters   Codeine Other (See Comments)    Unknown    Diphenhydramine    Pregabalin Other (See Comments)    Lack of effect for neuropathy pain.   Sulfonamide Derivatives    Tiotropium Bromide Monohydrate Other (See Comments)    Voice changes   Umeclidinium    Vraylar [Cariprazine]     intolerant    Patient Measurements: Height: 5\' 5"  (165.1 cm) Weight: 107.5 kg (236 lb 15.9 oz) IBW/kg (Calculated) : 57   Vital Signs: Temp: 98.6 F (37 C) (09/23 0544) Temp Source: Axillary (09/23 0544) BP: 122/49 (09/23 1300) Pulse Rate: 53 (09/23 1300)  Labs: Recent Labs    11/28/22 2108 11/29/22 0539  HGB 10.3* 10.0*  HCT 34.3* 33.0*  PLT 151 153  CREATININE 1.33* 1.33*  TROPONINIHS 11  --     Estimated Creatinine Clearance: 37.7 mL/min (A) (by C-G formula based on SCr of 1.33 mg/dL (H)).   Medical History: Past Medical History:  Diagnosis Date   Allergy, unspecified not elsewhere classified    Anxiety    Aortic stenosis, mild 03/30/2022   a. TTE 03/29/2022: EF 55-60, no RWMA, GR 1 DD, normal RVSF, mild aortic stenosis (mean 10, V-max 212 cm/s, DI 0.70)   Cataract    Chronic diastolic congestive heart failure (HCC)    a. 06/2022 Echo: EF 60-65%, no rwma, nl RV fxn, mild MR, mild AS.   COPD (chronic obstructive pulmonary disease) (HCC) 03/08/1998   PFTs 12/12/2002 FEV 1 1.42 (64%) ratio 58 with no better after B2 and DLCO75%; PFTs 11/20/09 FEV1 1.50 (73%) ratio 50 no better after B2 with DLCO 62%; Hfa 50% 11/20/2009 >75%, 01/13/10 p coaching   Coronary artery calcification seen on  CT scan    a. 2012 Neg stress test.   Depression 12/07/1998   Diabetes mellitus type II 02/05/2006   Dr. Elvera Lennox with endo   Disorders of bursae and tendons in shoulder region, unspecified    Rotator cuff syndrome, right   E. coli bacteremia    Esophagitis    GERD (gastroesophageal reflux disease)    History of UTI    Hyperlipidemia 10/04/2000   Hypertension 03/08/1992   Hypothyroidism 03/08/1968   Iron deficiency anemia    Lung nodule    radiation starts 10-08-2019   Malignant neoplasm of right upper lobe of lung (HCC) 07/24/2019   Obesity    NOS   OSA (obstructive sleep apnea)    PSG 01/27/10 AHI 13, pt does not know CPAP settings   PAF (paroxysmal atrial fibrillation) (HCC)    a. 06/2022 in setting of UTI/encephalopathy-->converted on amio; b. CHA2DS2VASc = 6-->No OAC 2/2 chronic anemia/fall risk; c. 06/2022 Zio: Predominantly sinus bradycardia at 54 (3-158)'s.  1 brief run of SVT.  2.2% PVC burden.   Peripheral neuropathy    Likely due to DM per Dr. Tresa Endo hernia     Assessment: Patient is a 87 year old female with a past medical history of COPD requiring CPAP, HTN, HDL, DM, CAD, PVD, dCHF, hypothyroidism, depression with anxiety, CKD-3, A-fib not on anticoagulants, and obesity. Pharmacy was consulted  to dose enoxaparin for VTE prophylaxis.  Goal of Therapy:   Anti-Xa level 0.2-0.4 units/mL (not typically monitored) Monitor platelets by anticoagulation protocol: Yes   Plan:  Initiate enoxaparin 0.5 mg/kg SQ q24h   Merryl Hacker, PharmD Clinical Pharmacist 11/29/2022,2:51 PM

## 2022-11-29 NOTE — Progress Notes (Signed)
PT Cancellation Note  Patient Details Name: Alexandria [name] MRN: [id number] DOB: [birth year]   Cancelled Treatment:    Reason Eval/Treat Not Completed: Medical issues which prohibited therapy (Discussed with nurse. Patient still requiring BiPAP for respiratory issues. Will hold PT and follow up tomorrow as appropriate.)  Donna Bernard, PT, MPT  Ina Homes 11/29/2022, 11:55 AM

## 2022-11-29 NOTE — Assessment & Plan Note (Signed)
Stable, no chest pain. Hs-troponin normal 11. --Crestor, Imdur  --Obtain stat EKG and check troponin if active chest pain

## 2022-11-29 NOTE — Assessment & Plan Note (Signed)
Hbg stable at 10.0. Note pt required blood transfusion during recent admission. --Monitor Hbg --Transfusion threshold 7

## 2022-11-29 NOTE — ED Notes (Signed)
Pulmonology at bedside.

## 2022-11-29 NOTE — Hospital Course (Signed)
HPI on admission 11/28/2022:  "Alexandria [name] is a 87 y.o. female with medical history significant of COPD requiring CPAP, HTN, HDL, DM, CAD, PVD, dCHF, hypothyroidism, depression with anxiety, CKD-3, A-fib not on anticoagulants, obesity, who presents with shortness breath."    See H&P for full HPI on admission and ED course.   Patient was admitted for acute on chronic respiratory failure with hypoxia and hypercapnia due to Covid-19 infection.  No evidence pneumonia on imaging. She was placed on BiPAP, started on IV steroids, given one dose IV Lasix.  Further hospital course and management as outlined below.  9/24 - notified by ED RN that pt's daughter declining IV steroids, stating "she just finished steroids".  RN also reports O2 desaturations down to 60's when briefly attempted off BiPAP on nasal cannula oxygen.  Patient's blood gas is improved from pH 7.29 >> 7.39 and pCO2 92 >> 72.  No mental status changes.    Daughter spoke with someone on her phone about two medications for treating Covid, then asked for me to start either ivermectin or hydroxychloroquine.  I explained that these are not treatments used for Covid, and have no data to support their use.  She declines remdesevir or Paxlovid, citing side effects or harms she's heard of.  Discussed in detail that steroids have the most data to support use in Covid with acute hypoxic respiratory failure both with or without pneumonia.  She ultimately agreed to IV steroids being continued.  She continued to press for starting off-label treatment as stated above, and asked for second opinion from Pulmonology, as Dr. Karna Christmas had previously seen patient during her recent admission.  9/25 - pt on baseline 2 L/min O2 today.  PT/OT evaluations pending for return to SNF.

## 2022-11-29 NOTE — ED Notes (Addendum)
ED TO INPATIENT HANDOFF REPORT  ED Nurse Name and Phone #: Elnita Maxwell [phone]  S Name/Age/Gender Alexandria [name] 87 y.o. female Room/Bed: ED10A/ED10A  Code Status   Code Status: Full Code  Home/SNF/Other Rehab Patient oriented to: self, place, time, and situation Is this baseline? Yes   Triage Complete: Triage complete  Chief Complaint Acute on chronic respiratory failure with hypercapnia (HCC) [J96.22] Acute on chronic respiratory failure with hypoxia and hypercapnia (HCC) [M01.02, J96.22]  Triage Note Pt BIB EMS from [facility] for [facility] only when sleeping at night. Pt was discharged yesterday and placed in rehab but the pt was not discharged with an APAP machine. Pt is on 2LPM O2 via [place] at baseline, SPO2 100%. Denies SHOB on arrival, pt is AAOX4, HOH.   Allergies Allergies  Allergen Reactions   Antihistamines, Diphenhydramine-Type Other (See Comments)    Able to tolerate only allegra.    Incruse Ellipta [Umeclidinium Bromide] Cough    Caused voice change and severe coughing   Clarithromycin Other (See Comments)    REACTION: diff swallowing and mouth blisters   Codeine Other (See Comments)    Unknown    Diphenhydramine    Pregabalin Other (See Comments)    Lack of effect for neuropathy pain.   Sulfonamide Derivatives    Tiotropium Bromide Monohydrate Other (See Comments)    Voice changes   Umeclidinium    Vraylar [Cariprazine]     intolerant    Level of Care/Admitting Diagnosis ED Disposition     ED Disposition  Admit   Condition  --   Comment  Hospital Area: [facility] [[id number]]  Level of Care: Progressive [102]  Admit to Progressive based on following criteria: RESPIRATORY PROBLEMS hypoxemic/hypercapnic respiratory failure that is responsive to NIPPV (BiPAP) or High Flow Nasal Cannula (6-80 lpm). Frequent assessment/intervention, no > Q2 hrs < Q4 hrs, to maintain oxygenation and pulmonary hygiene.  Covid Evaluation: Confirmed  COVID Positive  Diagnosis: Acute on chronic respiratory failure with hypoxia and hypercapnia [facility]) [[id number]]  Admitting Physician: Pennie Banter [[id number]]  Attending Physician: Pennie Banter [[id number]]  Certification:: I certify this patient will need inpatient services for at least 2 midnights          B Medical/Surgery History Past Medical History:  Diagnosis Date   Allergy, unspecified not elsewhere classified    Anxiety    Aortic stenosis, mild 03/30/2022   a. TTE 03/29/2022: EF 55-60, no RWMA, GR 1 DD, normal RVSF, mild aortic stenosis (mean 10, V-max 212 cm/s, DI 0.70)   Cataract    Chronic diastolic congestive heart failure (HCC)    a. 06/2022 Echo: EF 60-65%, no rwma, nl RV fxn, mild MR, mild AS.   COPD (chronic obstructive pulmonary disease) (HCC) 03/08/1998   PFTs 12/12/2002 FEV 1 1.42 (64%) ratio 58 with no better after B2 and DLCO75%; PFTs 11/20/09 FEV1 1.50 (73%) ratio 50 no better after B2 with DLCO 62%; Hfa 50% 11/20/2009 >75%, 01/13/10 p coaching   Coronary artery calcification seen on CT scan    a. 2012 Neg stress test.   Depression 12/07/1998   Diabetes mellitus type II 02/05/2006   Dr. Elvera Lennox with endo   Disorders of bursae and tendons in shoulder region, unspecified    Rotator cuff syndrome, right   E. coli bacteremia    Esophagitis    GERD (gastroesophageal reflux disease)    History of UTI    Hyperlipidemia 10/04/2000   Hypertension 03/08/1992   Hypothyroidism 03/08/1968  Iron deficiency anemia    Lung nodule    radiation starts 10-08-2019   Malignant neoplasm of right upper lobe of lung (HCC) 07/24/2019   Obesity    NOS   OSA (obstructive sleep apnea)    PSG 01/27/10 AHI 13, pt does not know CPAP settings   PAF (paroxysmal atrial fibrillation) (HCC)    a. 06/2022 in setting of UTI/encephalopathy-->converted on amio; b. CHA2DS2VASc = 6-->No OAC 2/2 chronic anemia/fall risk; c. 06/2022 Zio: Predominantly sinus bradycardia at 54 (3-158)'s.  1 brief  run of SVT.  2.2% PVC burden.   Peripheral neuropathy    Likely due to DM per Dr. Tresa Endo hernia    Past Surgical History:  Procedure Laterality Date   ABD U/S  03/19/1999   Nml x2 foci in liver   ADENOSINE MYOVIEW  06/02/2007   Nml   CARDIOLITE PERSANTINE  08/24/2000   Nml   CAROTID U/S  08/24/2000   1-39% ICA stenosis   CAROTID U/S  06/02/2007   No apprec change    CARPAL TUNNEL RELEASE  12/1997   Right   CESAREAN SECTION     x2 Breech/ repeat   CHOLECYSTECTOMY  1997   COLONOSCOPY WITH PROPOFOL N/A 02/12/2016   Procedure: COLONOSCOPY WITH PROPOFOL;  Surgeon: Rachael Fee, MD;  Location: [facility];  Service: Endoscopy;  Laterality: N/A;   CT ABD W & PELVIS WO/W CM     Abd hemangiomas of liver, 1 cm R renal cyst   DENTAL SURGERY  2016   Implants   DEXA  07/03/2003   Nml   ESOPHAGOGASTRODUODENOSCOPY  12/05/1997   Nml (due to hoarseness)   ESOPHAGOGASTRODUODENOSCOPY (EGD) WITH PROPOFOL N/A 02/12/2016   Procedure: ESOPHAGOGASTRODUODENOSCOPY (EGD) WITH PROPOFOL;  Surgeon: Rachael Fee, MD;  Location: [facility];  Service: Endoscopy;  Laterality: N/A;   GALLBLADDER SURGERY     HERNIA REPAIR  01/24/2009   Lap Ventr w/ Lysis of adhesions (Dr. Dwain Sarna)   knee arthroscopic surgery  years ago   right   ROTATOR CUFF REPAIR  1984   Right, [place]   SHOULDER OPEN ROTATOR CUFF REPAIR  02/08/2012   Procedure: ROTATOR CUFF REPAIR SHOULDER OPEN;  Surgeon: Drucilla Schmidt, MD;  Location: [facility];  Service: Orthopedics;  Laterality: Left;  Left Shoulder Open Anterior Acrominectomy Rotator Cuff Repair Open Distal Clavicle Resection ,tissue mend graft, and repair of biceps tendon   THUMB RELEASE  12/1997   Right   TONSILLECTOMY     TOTAL ABDOMINAL HYSTERECTOMY  1985   Due to dysmennorhea   US ECHOCARDIOGRAPHY  06/02/2007     A IV Location/Drains/Wounds Patient Lines/Drains/Airways Status     Active Line/Drains/Airways     Name Placement date Placement time  Site Days   Peripheral IV 11/28/22 20 G Anterior;Proximal;Right Forearm 11/28/22  2107  Forearm  1   External Urinary Catheter 11/29/22  0140  --  less than 1            Intake/Output Last 24 hours No intake or output data in the 24 hours ending 11/29/22 2027  Labs/Imaging Results for orders placed or performed during the hospital encounter of 11/28/22 (from the past 48 hour(s))  CBC     Status: Abnormal   Collection Time: 11/28/22  9:08 PM  Result Value Ref Range   WBC 8.9 4.0 - 10.5 K/uL   RBC 3.24 (L) 3.87 - 5.11 MIL/uL   Hemoglobin 10.3 (L) 12.0 - 15.0 g/dL  HCT 34.3 (L) 36.0 - 46.0 %   MCV 105.9 (H) 80.0 - 100.0 fL   MCH 31.8 26.0 - 34.0 pg   MCHC 30.0 30.0 - 36.0 g/dL   RDW 62.1 30.8 - 65.7 %   Platelets 151 150 - 400 K/uL   nRBC 0.2 0.0 - 0.2 %    Comment: Performed at [facility], [address] [place], [place] [address]  Comprehensive metabolic panel     Status: Abnormal   Collection Time: 11/28/22  9:08 PM  Result Value Ref Range   Sodium 145 135 - 145 mmol/L   Potassium 4.1 3.5 - 5.1 mmol/L   Chloride 98 98 - 111 mmol/L   CO2 37 (H) 22 - 32 mmol/L   Glucose, Bld 137 (H) 70 - 99 mg/dL    Comment: Glucose reference range applies only to samples taken after fasting for at least 8 hours.   BUN 38 (H) 8 - 23 mg/dL   Creatinine, Ser 2.95 (H) 0.44 - 1.00 mg/dL   Calcium 8.4 (L) 8.9 - 10.3 mg/dL   Total Protein 6.1 (L) 6.5 - 8.1 g/dL   Albumin 3.1 (L) 3.5 - 5.0 g/dL   AST 17 15 - 41 U/L   ALT 29 0 - 44 U/L   Alkaline Phosphatase 97 38 - 126 U/L   Total Bilirubin 0.7 0.3 - 1.2 mg/dL   GFR, Estimated 39 (L) >60 mL/min    Comment: (NOTE) Calculated using the CKD-EPI Creatinine Equation (2021)    Anion gap 10 5 - 15    Comment: Performed at [facility], [address], [place], [place] [address]  Troponin I (High Sensitivity)     Status: None   Collection Time: 11/28/22  9:08 PM  Result Value Ref Range   Troponin I (High Sensitivity) 11  <18 ng/L    Comment: (NOTE) Elevated high sensitivity troponin I (hsTnI) values and significant  changes across serial measurements may suggest ACS but many other  chronic and acute conditions are known to elevate hsTnI results.  Refer to the "Links" section for chest pain algorithms and additional  guidance. Performed at [facility], [address] [place], [place] [address]   Brain natriuretic peptide     Status: Abnormal   Collection Time: 11/28/22  9:08 PM  Result Value Ref Range   B Natriuretic Peptide 141.5 (H) 0.0 - 100.0 pg/mL    Comment: Performed at [facility], [address] [place], [place] [address]  Blood gas, venous     Status: Abnormal   Collection Time: 11/28/22  9:08 PM  Result Value Ref Range   pH, Ven 7.29 7.25 - 7.43    Comment: CRITICAL RESULT CALLED TO, READ BACK BY AND VERIFIED WITH: Jene Every [id number] 2202 DT    pCO2, Ven 92 (HH) 44 - 60 mmHg    Comment: CRITICAL RESULT CALLED TO, READ BACK BY AND VERIFIED WITH: Jene Every [id number] 2202 DT    pO2, Ven 32 32 - 45 mmHg   Bicarbonate 44.2 (H) 20.0 - 28.0 mmol/L   Acid-Base Excess 13.3 (H) 0.0 - 2.0 mmol/L   O2 Saturation 46.9 %   Patient temperature 37.0    Collection site VEIN     Comment: Performed at [facility], [address], [place], [place] [address]  SARS Coronavirus 2 by RT PCR (hospital order, performed in [facility] hospital lab) *cepheid single result test* Anterior Nasal Swab     Status:  Abnormal   Collection Time: 11/28/22 10:42 PM   Specimen: Anterior Nasal Swab  Result Value Ref Range   SARS Coronavirus 2 by RT PCR POSITIVE (A) NEGATIVE    Comment: (NOTE) SARS-CoV-2 target nucleic acids are DETECTED  SARS-CoV-2 RNA is generally detectable in upper respiratory specimens  during the acute phase of infection.  Positive results are indicative  of the presence of the identified virus, but do not rule out bacterial infection or co-infection with  other pathogens not detected by the test.  Clinical correlation with patient history and  other diagnostic information is necessary to determine patient infection status.  The expected result is negative.  Fact Sheet for Patients:   RoadLapTop.co.za   Fact Sheet for Healthcare Providers:   http://kim-miller.com/    This test is not yet approved or cleared by the [place] FDA and  has been authorized for detection and/or diagnosis of SARS-CoV-2 by FDA under an Emergency Use Authorization (EUA).  This EUA will remain in effect (meaning this test can be used) for the duration of  the COVID-19 declaration under Section 564(b)(1)  of the Act, 21 U.S.C. section 360-bbb-3(b)(1), unless the authorization is terminated or revoked sooner.   Performed at [facility], [address] [place], [place] [address]   Blood gas, arterial     Status: Abnormal   Collection Time: 11/29/22 12:54 AM  Result Value Ref Range   FIO2 40 %   Delivery systems BILEVEL POSITIVE AIRWAY PRESSURE    Inspiratory PAP 14 cmH2O   Expiratory PAP 5 cmH2O   pH, Arterial 7.39 7.35 - 7.45   pCO2 arterial 72 (HH) 32 - 48 mmHg    Comment: CRITICAL RESULT CALLED TO, READ BACK BY AND VERIFIED WITH: JULIO LEY [id number] 0118 DT    pO2, Arterial 81 (L) 83 - 108 mmHg   Bicarbonate 43.6 (H) 20.0 - 28.0 mmol/L   Acid-Base Excess 15.0 (H) 0.0 - 2.0 mmol/L   O2 Saturation 97.4 %   Patient temperature 37.0    Collection site RIGHT RADIAL    Allens test (pass/fail) PASS PASS    Comment: Performed at [facility], [address], [place], [place] [address]  Basic metabolic panel     Status: Abnormal   Collection Time: 11/29/22  5:39 AM  Result Value Ref Range   Sodium 145 135 - 145 mmol/L   Potassium 4.4 3.5 - 5.1 mmol/L   Chloride 99 98 - 111 mmol/L   CO2 37 (H) 22 - 32 mmol/L   Glucose, Bld 203 (H) 70 - 99 mg/dL    Comment: Glucose reference range  applies only to samples taken after fasting for at least 8 hours.   BUN 37 (H) 8 - 23 mg/dL   Creatinine, Ser 7.82 (H) 0.44 - 1.00 mg/dL   Calcium 8.3 (L) 8.9 - 10.3 mg/dL   GFR, Estimated 39 (L) >60 mL/min    Comment: (NOTE) Calculated using the CKD-EPI Creatinine Equation (2021)    Anion gap 9 5 - 15    Comment: Performed at [facility], [address] [place], [place] [address]  CBC     Status: Abnormal   Collection Time: 11/29/22  5:39 AM  Result Value Ref Range   WBC 9.7 4.0 - 10.5 K/uL   RBC 3.14 (L) 3.87 - 5.11 MIL/uL   Hemoglobin 10.0 (L) 12.0 - 15.0 g/dL   HCT 30.8 (L) 65.7 - 84.6 %   MCV 105.1 (H) 80.0 -  100.0 fL   MCH 31.8 26.0 - 34.0 pg   MCHC 30.3 30.0 - 36.0 g/dL   RDW 21.3 08.6 - 57.8 %   Platelets 153 150 - 400 K/uL   nRBC 0.0 0.0 - 0.2 %    Comment: Performed at [facility], [address] [place], [place] [address]  CBG monitoring, ED     Status: Abnormal   Collection Time: 11/29/22  4:57 PM  Result Value Ref Range   Glucose-Capillary 184 (H) 70 - 99 mg/dL    Comment: Glucose reference range applies only to samples taken after fasting for at least 8 hours.  Lactic acid, plasma     Status: None   Collection Time: 11/29/22  5:19 PM  Result Value Ref Range   Lactic Acid, Venous 0.8 0.5 - 1.9 mmol/L    Comment: Performed at [facility], [address] [place], [place] [address]  D-dimer, quantitative     Status: Abnormal   Collection Time: 11/29/22  5:19 PM  Result Value Ref Range   D-Dimer, Quant 0.71 (H) 0.00 - 0.50 ug/mL-FEU    Comment: (NOTE) At the manufacturer cut-off value of 0.5 g/mL FEU, this assay has a negative predictive value of 95-100%.This assay is intended for use in conjunction with a clinical pretest probability (PTP) assessment model to exclude pulmonary embolism (PE) and deep venous thrombosis (DVT) in outpatients suspected of PE or DVT. Results should be correlated with clinical  presentation. Performed at [facility], [address] [place], [place] [address]    *Note: Due to a large number of results and/or encounters for the requested time period, some results have not been displayed. A complete set of results can be found in Results Review.   DG Chest Port 1 View  Result Date: 11/28/2022 CLINICAL DATA:  Shortness of breath EXAM: PORTABLE CHEST 1 VIEW COMPARISON:  11/23/2022 FINDINGS: Cardiomegaly with aortic atherosclerosis. No acute airspace disease or pleural effusion. No pneumothorax IMPRESSION: Cardiomegaly. Electronically Signed   By: Jasmine Pang M.D.   On: 11/28/2022 22:05    Pending Labs Unresulted Labs (From admission, onward)     Start     Ordered   11/30/22 0500  Blood gas, venous  Tomorrow morning,   R        11/29/22 1435   11/30/22 0500  Basic metabolic panel  Tomorrow morning,   R        11/29/22 1435   11/30/22 0500  Magnesium  Tomorrow morning,   R        11/29/22 1435   11/30/22 0500  CBC with Differential/Platelet  Tomorrow morning,   R        11/29/22 1435   11/30/22 0500  Ferritin  Daily,   R      11/29/22 1558   11/30/22 0500  C-reactive protein  Daily,   R      11/29/22 1558   11/29/22 1434  C-reactive protein  Once,   R        11/29/22 1433   11/28/22 2229  Expectorated Sputum Assessment w Gram Stain, Rflx to Resp Cult  Once,   R        11/28/22 2228            Vitals/Pain Today's Vitals   11/29/22 1800 11/29/22 1900 11/29/22 1958 11/29/22 2000  BP: (!) 130/52 (!) 134/43  112/63  Pulse: 61 60  63  Resp: 17 17  18   Temp:  98.6 F (37 C)   TempSrc:   Oral   SpO2: 92% 94%  98%  Weight:      Height:      PainSc:        Isolation Precautions Airborne and Contact precautions  Medications Medications  dextromethorphan-guaiFENesin (MUCINEX DM) 30-600 MG per 12 hr tablet 1 tablet (has no administration in time range)  ondansetron (ZOFRAN) injection 4 mg (4 mg Intravenous Given 11/28/22 2341)   hydrALAZINE (APRESOLINE) injection 5 mg (has no administration in time range)  acetaminophen (TYLENOL) tablet 650 mg (has no administration in time range)  amiodarone (PACERONE) tablet 200 mg (200 mg Oral Given 11/29/22 0941)  bisoprolol (ZEBETA) tablet 2.5 mg (2.5 mg Oral Given 11/29/22 0940)  ezetimibe (ZETIA) tablet 10 mg (has no administration in time range)  isosorbide mononitrate (IMDUR) 24 hr tablet 30 mg (30 mg Oral Given 11/29/22 0943)  nitroGLYCERIN (NITROSTAT) SL tablet 0.4 mg (has no administration in time range)  rosuvastatin (CRESTOR) tablet 10 mg (has no administration in time range)  busPIRone (BUSPAR) tablet 15 mg (15 mg Oral Given 11/29/22 1521)  FLUoxetine (PROZAC) capsule 20 mg (20 mg Oral Given 11/29/22 0943)  levothyroxine (SYNTHROID) tablet 125 mcg (125 mcg Oral Given 11/29/22 0749)  pantoprazole (PROTONIX) EC tablet 40 mg (40 mg Oral Given 11/29/22 0943)  polyethylene glycol (MIRALAX / GLYCOLAX) packet 17 g (17 g Oral Patient Refused/Not Given 11/29/22 0947)  senna-docusate (Senokot-S) tablet 1 tablet (1 tablet Oral Not Given 11/29/22 0100)  cyanocobalamin (VITAMIN B12) tablet 2,000 mcg (2,000 mcg Oral Given 11/29/22 0940)  ferrous sulfate tablet 325 mg (325 mg Oral Given 11/29/22 0943)  cholecalciferol (VITAMIN D3) 25 MCG (1000 UNIT) tablet 2,000 Units (2,000 Units Oral Given 11/29/22 0943)  budesonide (PULMICORT) nebulizer solution 0.5 mg (0.5 mg Nebulization Given 11/29/22 0842)  loratadine (CLARITIN) tablet 10 mg (has no administration in time range)  torsemide (DEMADEX) tablet 40 mg (40 mg Oral Given 11/29/22 0943)  albuterol (PROVENTIL) (2.5 MG/3ML) 0.083% nebulizer solution 2.5 mg (has no administration in time range)  albuterol (PROVENTIL) (2.5 MG/3ML) 0.083% nebulizer solution 2.5 mg (2.5 mg Nebulization Given 11/29/22 1411)  insulin aspart (novoLOG) injection 0-9 Units (2 Units Subcutaneous Given 11/29/22 1706)  enoxaparin (LOVENOX) injection 55 mg (has no administration in  time range)  zinc sulfate capsule 220 mg (220 mg Oral Given 11/29/22 1705)  ascorbic acid (VITAMIN C) tablet 500 mg (500 mg Oral Given 11/29/22 1705)  methylPREDNISolone sodium succinate (SOLU-MEDROL) 40 mg/mL injection 40 mg (has no administration in time range)  arformoterol (BROVANA) nebulizer solution 15 mcg (has no administration in time range)  furosemide (LASIX) injection 40 mg (40 mg Intravenous Given 11/28/22 2342)    Mobility walks with device     Focused Assessments Pulmonary Assessment Handoff:  Lung sounds: Bilateral Breath Sounds: Diminished, Expiratory wheezes L Breath Sounds: Expiratory wheezes R Breath Sounds: Expiratory wheezes O2 Device: Nasal Cannula O2 Flow Rate (L/min): 2 L/min    R Recommendations: See Admitting Provider Note   Additional Notes: A&O x4, daughter at bedside, pt has a purwick in placed established by dayshift RN. Pt wears CPAP at night at home, rehab facility was not able to provide this for pt for several days, pt was SOB, pt reports is feeling better at this time, pt was switch from BiPAP to 2L [place] and handling it well.   PT IS COVID +

## 2022-11-29 NOTE — Consult Note (Signed)
PULMONOLOGY         Date: 11/29/2022,   MRN# [id number] Alexandria [name] August 24, 1935     AdmissionWeight: 107.5 kg                 CurrentWeight: 107.5 kg  Referring provider: Dr Denton Lank   CHIEF COMPLAINT:   Acute on chronic hypoxemic respiratory failure   HISTORY OF PRESENT ILLNESS   Alexandria [name] is a 87 y.o. female with medical history significant of COPD requiring CPAP, HTN, HDL, DM, CAD, PVD, dCHF, hypothyroidism, depression with anxiety, CKD-3, A-fib not on anticoagulants, obesity, who presents with shortness breath. She was recently hospitalized for hypercapnic encephalopathy with moderate COPD exacerbation and improved with new CPAP prescription.   Patient also had worsening anemia and required 2 unit of blood transfusion and IV iron infusion. Pt was discharged to rehab facility.  Patient was diagnosed with COVID19 infection during this admission.  Her family requests atypical therapy including hydroxychloroquin and ivermectin.   PAST MEDICAL HISTORY   Past Medical History:  Diagnosis Date   Allergy, unspecified not elsewhere classified    Anxiety    Aortic stenosis, mild 03/30/2022   a. TTE 03/29/2022: EF 55-60, no RWMA, GR 1 DD, normal RVSF, mild aortic stenosis (mean 10, V-max 212 cm/s, DI 0.70)   Cataract    Chronic diastolic congestive heart failure (HCC)    a. 06/2022 Echo: EF 60-65%, no rwma, nl RV fxn, mild MR, mild AS.   COPD (chronic obstructive pulmonary disease) (HCC) 03/08/1998   PFTs 12/12/2002 FEV 1 1.42 (64%) ratio 58 with no better after B2 and DLCO75%; PFTs 11/20/09 FEV1 1.50 (73%) ratio 50 no better after B2 with DLCO 62%; Hfa 50% 11/20/2009 >75%, 01/13/10 p coaching   Coronary artery calcification seen on CT scan    a. 2012 Neg stress test.   Depression 12/07/1998   Diabetes mellitus type II 02/05/2006   Dr. Elvera Lennox with endo   Disorders of bursae and tendons in shoulder region, unspecified    Rotator cuff syndrome, right   E. coli  bacteremia    Esophagitis    GERD (gastroesophageal reflux disease)    History of UTI    Hyperlipidemia 10/04/2000   Hypertension 03/08/1992   Hypothyroidism 03/08/1968   Iron deficiency anemia    Lung nodule    radiation starts 10-08-2019   Malignant neoplasm of right upper lobe of lung (HCC) 07/24/2019   Obesity    NOS   OSA (obstructive sleep apnea)    PSG 01/27/10 AHI 13, pt does not know CPAP settings   PAF (paroxysmal atrial fibrillation) (HCC)    a. 06/2022 in setting of UTI/encephalopathy-->converted on amio; b. CHA2DS2VASc = 6-->No OAC 2/2 chronic anemia/fall risk; c. 06/2022 Zio: Predominantly sinus bradycardia at 54 (3-158)'s.  1 brief run of SVT.  2.2% PVC burden.   Peripheral neuropathy    Likely due to DM per Dr. Tresa Endo hernia      SURGICAL HISTORY   Past Surgical History:  Procedure Laterality Date   ABD U/S  03/19/1999   Nml x2 foci in liver   ADENOSINE MYOVIEW  06/02/2007   Nml   CARDIOLITE PERSANTINE  08/24/2000   Nml   CAROTID U/S  08/24/2000   1-39% ICA stenosis   CAROTID U/S  06/02/2007   No apprec change    CARPAL TUNNEL RELEASE  12/1997   Right   CESAREAN SECTION     x2 Breech/ repeat  CHOLECYSTECTOMY  1997   COLONOSCOPY WITH PROPOFOL N/A 02/12/2016   Procedure: COLONOSCOPY WITH PROPOFOL;  Surgeon: Rachael Fee, MD;  Location: [facility];  Service: Endoscopy;  Laterality: N/A;   CT ABD W & PELVIS WO/W CM     Abd hemangiomas of liver, 1 cm R renal cyst   DENTAL SURGERY  2016   Implants   DEXA  07/03/2003   Nml   ESOPHAGOGASTRODUODENOSCOPY  12/05/1997   Nml (due to hoarseness)   ESOPHAGOGASTRODUODENOSCOPY (EGD) WITH PROPOFOL N/A 02/12/2016   Procedure: ESOPHAGOGASTRODUODENOSCOPY (EGD) WITH PROPOFOL;  Surgeon: Rachael Fee, MD;  Location: [facility];  Service: Endoscopy;  Laterality: N/A;   GALLBLADDER SURGERY     HERNIA REPAIR  01/24/2009   Lap Ventr w/ Lysis of adhesions (Dr. Dwain Sarna)   knee arthroscopic surgery  years ago    right   ROTATOR CUFF REPAIR  1984   Right, [place]   SHOULDER OPEN ROTATOR CUFF REPAIR  02/08/2012   Procedure: ROTATOR CUFF REPAIR SHOULDER OPEN;  Surgeon: Drucilla Schmidt, MD;  Location: [facility];  Service: Orthopedics;  Laterality: Left;  Left Shoulder Open Anterior Acrominectomy Rotator Cuff Repair Open Distal Clavicle Resection ,tissue mend graft, and repair of biceps tendon   THUMB RELEASE  12/1997   Right   TONSILLECTOMY     TOTAL ABDOMINAL HYSTERECTOMY  1985   Due to dysmennorhea   US ECHOCARDIOGRAPHY  06/02/2007     FAMILY HISTORY   Family History  Problem Relation Age of Onset   Heart disease Mother    Thyroid disease Mother    Emphysema Father        One lung   Cystic fibrosis Sister    Hyperthyroidism Sister    Osteoarthritis Brother    Hyperthyroidism Brother    Esophageal cancer Neg Hx    Cancer Neg Hx        Head or neck   Colon cancer Neg Hx    Stomach cancer Neg Hx    Breast cancer Neg Hx      SOCIAL HISTORY   Social History   Tobacco Use   Smoking status: Former    Current packs/day: 0.00    Average packs/day: 2.0 packs/day for 50.0 years (100.0 ttl pk-yrs)    Types: Cigarettes    Start date: 02/06/1955    Quit date: 02/05/2005    Years since quitting: 17.8   Smokeless tobacco: Never  Vaping Use   Vaping status: Never Used  Substance Use Topics   Alcohol use: Yes    Comment: occasionally   Drug use: Never     MEDICATIONS    Home Medication:  Current Outpatient Rx   Order #: [id number] Class: OTC   Order #: [id number] Class: Print   Order #: [id number] Class: Normal   Order #: [id number] Class: No Print   Order #: [id number] Class: Normal   Order #: [id number] Class: Normal   Order #: [id number] Class: Historical Med   Order #: [id number] Class: OTC   Order #: [id number] Class: Historical Med   Order #: [id number] Class: Historical Med   Order #: [id number] Class: Normal   Order #: [id number] Class: Normal   Order #: [id number] Class: Normal   Order  #: [id number] Class: Historical Med   Order #: [id number] Class: Normal   Order #: [id number] Class: Historical Med   Order #: [id number] Class: Normal   Order #: [id number] Class: Normal   Order #: [id number] Class: Normal   Order #: [id number] Class: Normal   Order #: [id number] Class: Normal   Order #: [id number] Class: Normal   Order #:  [id number] Class: Normal   Order #: [id number] Class: Normal   Order #: [id number] Class: Normal   Order #: [id number] Class: Normal   Order #: [id number] Class: OTC   Order #: [id number] Class: Normal   Order #: [id number] Class: Historical Med   Order #: [id number] Class: Normal    Current Medication:  Current Facility-Administered Medications:    acetaminophen (TYLENOL) tablet 650 mg, 650 mg, Oral, Q6H PRN, Lorretta Harp, MD   albuterol (PROVENTIL) (2.5 MG/3ML) 0.083% nebulizer solution 2.5 mg, 2.5 mg, Nebulization, Q4H PRN, Esaw Grandchild A, DO   albuterol (PROVENTIL) (2.5 MG/3ML) 0.083% nebulizer solution 2.5 mg, 2.5 mg, Nebulization, Q6H, Griffith, Kelly A, DO, 2.5 mg at 11/29/22 1411   amiodarone (PACERONE) tablet 200 mg, 200 mg, Oral, Daily, Lorretta Harp, MD, 200 mg at 11/29/22 0941   arformoterol (BROVANA) nebulizer solution 15 mcg, 15 mcg, Nebulization, QHS, Niu, Brien Few, MD   bisoprolol (ZEBETA) tablet 2.5 mg, 2.5 mg, Oral, Daily, Lorretta Harp, MD, 2.5 mg at 11/29/22 0940   budesonide (PULMICORT) nebulizer solution 0.5 mg, 0.5 mg, Nebulization, BID, Lorretta Harp, MD, 0.5 mg at 11/29/22 0842   busPIRone (BUSPAR) tablet 15 mg, 15 mg, Oral, TID, Lorretta Harp, MD, 15 mg at 11/29/22 1521   cholecalciferol (VITAMIN D3) 25 MCG (1000 UNIT) tablet 2,000 Units, 2,000 Units, Oral, Daily, Lorretta Harp, MD, 2,000 Units at 11/29/22 0254   cyanocobalamin (VITAMIN B12) tablet 2,000 mcg, 2,000 mcg, Oral, Daily, Lorretta Harp, MD, 2,000 mcg at 11/29/22 0940   dextromethorphan-guaiFENesin (MUCINEX DM) 30-600 MG per 12 hr tablet 1 tablet, 1 tablet, Oral, BID PRN, Lorretta Harp, MD   enoxaparin (LOVENOX)  injection 55 mg, 0.5 mg/kg, Subcutaneous, Q24H, Hunt, Madison H, RPH   ezetimibe (ZETIA) tablet 10 mg, 10 mg, Oral, QHS, Lorretta Harp, MD   ferrous sulfate tablet 325 mg, 325 mg, Oral, Daily, Lorretta Harp, MD, 325 mg at 11/29/22 0943   FLUoxetine (PROZAC) capsule 20 mg, 20 mg, Oral, BID, Lorretta Harp, MD, 20 mg at 11/29/22 2706   hydrALAZINE (APRESOLINE) injection 5 mg, 5 mg, Intravenous, Q2H PRN, Lorretta Harp, MD   insulin aspart (novoLOG) injection 0-9 Units, 0-9 Units, Subcutaneous, TID WC, Esaw Grandchild A, DO   isosorbide mononitrate (IMDUR) 24 hr tablet 30 mg, 30 mg, Oral, Daily, Lorretta Harp, MD, 30 mg at 11/29/22 0943   levothyroxine (SYNTHROID) tablet 125 mcg, 125 mcg, Oral, Q0600, Lorretta Harp, MD, 125 mcg at 11/29/22 0749   loratadine (CLARITIN) tablet 10 mg, 10 mg, Oral, Daily PRN, Lorretta Harp, MD   methylPREDNISolone sodium succinate (SOLU-MEDROL) 125 mg/2 mL injection 80 mg, 80 mg, Intravenous, Q12H, Lorretta Harp, MD, 80 mg at 11/29/22 1339   nitroGLYCERIN (NITROSTAT) SL tablet 0.4 mg, 0.4 mg, Sublingual, Q5 min PRN, Lorretta Harp, MD   ondansetron [facility]) injection 4 mg, 4 mg, Intravenous, Q8H PRN, Lorretta Harp, MD, 4 mg at 11/28/22 2341   pantoprazole (PROTONIX) EC tablet 40 mg, 40 mg, Oral, Daily, Lorretta Harp, MD, 40 mg at 11/29/22 0943   polyethylene glycol (MIRALAX / GLYCOLAX) packet 17 g, 17 g, Oral, Daily, Lorretta Harp, MD   Melene Muller ON 11/30/2022] rosuvastatin (CRESTOR) tablet 10 mg, 10 mg, Oral, QODAY, Lorretta Harp, MD   senna-docusate (Senokot-S) tablet 1 tablet, 1 tablet, Oral, QHS, Lorretta Harp, MD   torsemide (DEMADEX) tablet 40 mg, 40 mg, Oral, Daily, Lorretta Harp, MD, 40 mg at 11/29/22 2376  Current Outpatient Medications:    acetaminophen (TYLENOL) 325 MG tablet, Take 2 tablets (650 mg total) by mouth every 6 (six) hours as  needed for mild pain, headache, fever or moderate pain., Disp: , Rfl:    albuterol (PROAIR HFA) 108 (90 Base) MCG/ACT inhaler, Inhale 2 puffs into the lungs every 6 (six)  hours as needed for wheezing or shortness of breath. INHALE 2 PUFFS INTO THE LUNGS EVERY 6 HOURS AS NEEDED FOR WHEEZING OR SHORTNESS OF BREATH, Disp: 3 Inhaler, Rfl: 3   amiodarone (PACERONE) 200 MG tablet, TAKE 1 TABLET BY MOUTH EVERY DAY, Disp: 90 tablet, Rfl: 0   arformoterol (BROVANA) 15 MCG/2ML NEBU, Take 2 mLs (15 mcg total) by nebulization at bedtime., Disp: 120 mL, Rfl: 6   bisoprolol (ZEBETA) 5 MG tablet, Take 0.5 tablets (2.5 mg total) by mouth daily., Disp: 45 tablet, Rfl: 1   budesonide (PULMICORT) 0.5 MG/2ML nebulizer solution, Take 2 mLs (0.5 mg total) by nebulization in the morning and at bedtime., Disp: 360 mL, Rfl: 3   busPIRone (BUSPAR) 15 MG tablet, Take 15 mg by mouth 3 (three) times daily., Disp: , Rfl:    Cholecalciferol (VITAMIN D) 50 MCG (2000 UT) tablet, Take 1 tablet (2,000 Units total) by mouth daily., Disp: , Rfl:    Coenzyme Q-10 200 MG CAPS, Take 200 mg by mouth daily., Disp: , Rfl:    Cyanocobalamin (B-12) 5000 MCG CAPS, Take 5,000 mcg by mouth daily., Disp: , Rfl:    esomeprazole (NEXIUM) 40 MG capsule, TAKE 1 CAPSULE (40 MG TOTAL) BY MOUTH DAILY AT 12 NOON., Disp: 90 capsule, Rfl: 3   ezetimibe (ZETIA) 10 MG tablet, TAKE 1 TABLET BY MOUTH EVERYDAY AT BEDTIME, Disp: 90 tablet, Rfl: 2   ferrous sulfate 325 (65 FE) MG tablet, Take 1 tablet (325 mg total) by mouth daily., Disp: 90 tablet, Rfl: 3   FLUoxetine (PROZAC) 20 MG tablet, Take 20 mg by mouth 2 (two) times daily., Disp: , Rfl:    fluticasone (FLONASE) 50 MCG/ACT nasal spray, Place 2 sprays into both nostrils daily., Disp: 9.9 mL, Rfl: 2   isosorbide mononitrate (IMDUR) 30 MG 24 hr tablet, Take 30 mg by mouth daily., Disp: , Rfl:    levothyroxine (SYNTHROID) 125 MCG tablet, Take 1 tablet (125 mcg total) by mouth daily., Disp: 90 tablet, Rfl: 3   loratadine (CLARITIN) 10 MG tablet, Take 1 tablet (10 mg total) by mouth daily as needed for allergies., Disp: 30 tablet, Rfl: 0   magnesium 30 MG tablet, Take 1 tablet  (30 mg total) by mouth 2 (two) times daily. (Patient taking differently: Take 200 mg by mouth daily.), Disp: 180 tablet, Rfl: 3   nitroGLYCERIN (NITROSTAT) 0.4 MG SL tablet, Place 1 tablet (0.4 mg total) under the tongue every 5 (five) minutes as needed for chest pain., Disp: 25 tablet, Rfl: 3   polyethylene glycol (MIRALAX / GLYCOLAX) 17 g packet, Take 17 g by mouth daily., Disp: 14 each, Rfl: 0   potassium chloride (KLOR-CON) 10 MEQ tablet, TAKE 1 TABLET BY MOUTH EVERY DAY, Disp: 90 tablet, Rfl: 3   rosuvastatin (CRESTOR) 10 MG tablet, TAKE 1 TABLET BY MOUTH EVERY OTHER DAY, Disp: 45 tablet, Rfl: 2   senna-docusate (SENOKOT-S) 8.6-50 MG tablet, Take 1 tablet by mouth at bedtime., Disp: 30 tablet, Rfl: 0   torsemide (DEMADEX) 20 MG tablet, Take 2 tablets (40 mg total) by mouth daily. Take an additional 20 mg daily as needed for weight greater then 230 pounds, Disp: 270 tablet, Rfl: 1   glucose blood (ONETOUCH VERIO) test strip, USE AS INSTRUCTED TO CHECK SUGAR 1 TIME DAILY, Disp:  100 strip, Rfl: 3   guaiFENesin (MUCINEX) 600 MG 12 hr tablet, Take 2 tablets (1,200 mg total) by mouth 2 (two) times daily as needed for cough or to loosen phlegm., Disp: , Rfl:    OneTouch Delica Lancets 33G MISC, Use 1x a day, Disp: 100 each, Rfl: 3   OXYGEN, Inhale into the lungs. 2lpm, Disp: , Rfl:    Spacer/[employer] MISC, Use with  inhaler as needed.  J44.9, Disp: 1 each, Rfl: 0  Facility-Administered Medications Ordered in Other Encounters:    0.9 %  sodium chloride infusion, , Intravenous, Once, Brahmanday, Govinda R, MD   0.9 %  sodium chloride infusion, , Intravenous, Continuous, Louretta Shorten R, MD, Last Rate: 20 mL/hr at 11/02/22 1421, New Bag at 11/02/22 1421    ALLERGIES   Antihistamines, diphenhydramine-type; Incruse ellipta [umeclidinium bromide]; Clarithromycin; Codeine; Diphenhydramine; Pregabalin; Sulfonamide derivatives; Tiotropium bromide monohydrate; Umeclidinium; and Vraylar  [cariprazine]     REVIEW OF SYSTEMS    Review of Systems:  Gen:  Denies  fever, sweats, chills weigh loss  HEENT: Denies blurred vision, double vision, ear pain, eye pain, hearing loss, nose bleeds, sore throat Cardiac:  No dizziness, chest pain or heaviness, chest tightness,edema Resp:   reports dyspnea chronically  Gi: Denies swallowing difficulty, stomach pain, nausea or vomiting, diarrhea, constipation, bowel incontinence Gu:  Denies bladder incontinence, burning urine Ext:   Denies Joint pain, stiffness or swelling Skin: Denies  skin rash, easy bruising or bleeding or hives Endoc:  Denies polyuria, polydipsia , polyphagia or weight change Psych:   Denies depression, insomnia or hallucinations   Other:  All other systems negative   VS: BP (!) 122/49   Pulse (!) 53   Temp 98.6 F (37 C) (Axillary)   Resp 19   Ht 5\' 5"  (1.651 m)   Wt 107.5 kg   LMP  (LMP Unknown)   SpO2 97%   BMI 39.44 kg/m      PHYSICAL EXAM    GENERAL:NAD, no fevers, chills, no weakness no fatigue HEAD: Normocephalic, atraumatic.  EYES: Pupils equal, round, reactive to light. Extraocular muscles intact. No scleral icterus.  MOUTH: Moist mucosal membrane. Dentition intact. No abscess noted.  EAR, NOSE, THROAT: Clear without exudates. No external lesions.  NECK: Supple. No thyromegaly. No nodules. No JVD.  PULMONARY: decreased breath sounds with mild rhonchi worse at bases bilaterally.  CARDIOVASCULAR: S1 and S2. Regular rate and rhythm. No murmurs, rubs, or gallops. No edema. Pedal pulses 2+ bilaterally.  GASTROINTESTINAL: Soft, nontender, nondistended. No masses. Positive bowel sounds. No hepatosplenomegaly.  MUSCULOSKELETAL: No swelling, clubbing, or edema. Range of motion full in all extremities.  NEUROLOGIC: Cranial nerves II through XII are intact. No gross focal neurological deficits. Sensation intact. Reflexes intact.  SKIN: No ulceration, lesions, rashes, or cyanosis. Skin warm and dry.  Turgor intact.  PSYCHIATRIC: Mood, affect within normal limits. The patient is awake, alert and oriented x 3. Insight, judgment intact.       IMAGING   @IMAGES @   ASSESSMENT/PLAN    Acute COVID19 infection -Remdesevir antiviral - not indicated -vitamin C -zinc Solumedrol 20mg  IV daily   -Diuresis - torsemice 20 IV - monitor UOP - utilize external urinary catheter if possible -encourage to use IS and Acapella device for bronchopulmonary hygiene when able -PT/OT when possible -procalcitonin, CRP and ferritin trending   Advanced copd with chronic hypoxemia  Agree with steroids and nebulizer therapy per COPD care path  Thank you for allowing me to participate in the care of this patient.   Patient/Family are satisfied with care plan and all questions have been answered.    Provider disclosure: Patient with at least one acute or chronic illness or injury that poses a threat to life or bodily function and is being managed actively during this encounter.  All of the below services have been performed independently by signing provider:  review of prior documentation from internal and or external health records.  Review of previous and current lab results.  Interview and comprehensive assessment during patient visit today. Review of current and previous chest radiographs/CT scans. Discussion of management and test interpretation with health care team and patient/family.   This document was prepared using Dragon voice recognition software and may include unintentional dictation errors.     Vida Rigger, M.D.  Division of Pulmonary & Critical Care Medicine

## 2022-11-29 NOTE — Assessment & Plan Note (Signed)
2D echo on 06/30/2022 showed EF 60 to 65%.  BNP 141.5 on admission.  No pulm edema chest x-ray. Does have BLE edema, slightly worse on the right. --Gived 1 dose of IV Lasix 40 mg on admission --Continue home torsemide --Daily weights to monitor volume status --Monitor renal function, electrolytes --Check D-Dimer and if positive -- will get lower extremity doppler U/S to rule out DVT, given higher risk with Covid infection

## 2022-11-29 NOTE — Assessment & Plan Note (Signed)
Stable, not wheezing on exam.  Daughter reports pt recently completed steroids. --On IV steroids for Covid-19 with acute hypoxia as above --Pulmonology consulted --Scheduled and PRN albuterol nebs --Brovana & Pulmicort nebs

## 2022-11-29 NOTE — Progress Notes (Signed)
Patient now covid positive. Changed patient over to v60 bipap due to covid as well as patient with desaturation in 60's on resmed unit.  Pressure set at 14/6 initially however increased to 20/5 as patient noted to continue with desaturation in 60's. Abg obtained. Updated RN.

## 2022-11-29 NOTE — Progress Notes (Signed)
OT Cancellation Note  Patient Details Name: Alexandria [name] MRN: [id number] DOB: 09-03-1935   Cancelled Treatment:    Reason Eval/Treat Not Completed: Patient not medically ready. Consult received, chart reviewed. Patient still requiring BiPAP for respiratory issues. Will hold OT and follow up tomorrow as appropriate.   Arman Filter., MPH, MS, OTR/L ascom [phone] 11/29/22, 12:14 PM

## 2022-11-30 ENCOUNTER — Inpatient Hospital Stay: Payer: Medicare Other

## 2022-11-30 DIAGNOSIS — J9622 Acute and chronic respiratory failure with hypercapnia: Secondary | ICD-10-CM | POA: Diagnosis not present

## 2022-11-30 DIAGNOSIS — J9621 Acute and chronic respiratory failure with hypoxia: Secondary | ICD-10-CM | POA: Diagnosis not present

## 2022-11-30 LAB — BLOOD GAS, VENOUS
Acid-Base Excess: 14.5 mmol/L — ABNORMAL HIGH (ref 0.0–2.0)
Bicarbonate: 43.2 mmol/L — ABNORMAL HIGH (ref 20.0–28.0)
O2 Saturation: 88.1 %
Patient temperature: 37
pCO2, Ven: 73 mmHg (ref 44–60)
pH, Ven: 7.38 (ref 7.25–7.43)
pO2, Ven: 55 mmHg — ABNORMAL HIGH (ref 32–45)

## 2022-11-30 LAB — BASIC METABOLIC PANEL
Anion gap: 11 (ref 5–15)
BUN: 44 mg/dL — ABNORMAL HIGH (ref 8–23)
CO2: 37 mmol/L — ABNORMAL HIGH (ref 22–32)
Calcium: 8.1 mg/dL — ABNORMAL LOW (ref 8.9–10.3)
Chloride: 95 mmol/L — ABNORMAL LOW (ref 98–111)
Creatinine, Ser: 1.45 mg/dL — ABNORMAL HIGH (ref 0.44–1.00)
GFR, Estimated: 35 mL/min — ABNORMAL LOW (ref >60)
Glucose, Bld: 70 mg/dL (ref 70–99)
Potassium: 3.8 mmol/L (ref 3.5–5.1)
Sodium: 143 mmol/L (ref 135–145)

## 2022-11-30 LAB — C-REACTIVE PROTEIN: CRP: 1.9 mg/dL — ABNORMAL HIGH (ref <1.0)

## 2022-11-30 LAB — CBC WITH DIFFERENTIAL/PLATELET
Abs Immature Granulocytes: 0.1 10*3/uL — ABNORMAL HIGH (ref 0.00–0.07)
Basophils Absolute: 0 10*3/uL (ref 0.0–0.1)
Basophils Relative: 0 %
Eosinophils Absolute: 0 10*3/uL (ref 0.0–0.5)
Eosinophils Relative: 0 %
HCT: 30.8 % — ABNORMAL LOW (ref 36.0–46.0)
Hemoglobin: 9.5 g/dL — ABNORMAL LOW (ref 12.0–15.0)
Immature Granulocytes: 1 %
Lymphocytes Relative: 4 %
Lymphs Abs: 0.3 10*3/uL — ABNORMAL LOW (ref 0.7–4.0)
MCH: 31.5 pg (ref 26.0–34.0)
MCHC: 30.8 g/dL (ref 30.0–36.0)
MCV: 102 fL — ABNORMAL HIGH (ref 80.0–100.0)
Monocytes Absolute: 0.4 10*3/uL (ref 0.1–1.0)
Monocytes Relative: 5 %
Neutro Abs: 7.8 10*3/uL — ABNORMAL HIGH (ref 1.7–7.7)
Neutrophils Relative %: 90 %
Platelets: 167 10*3/uL (ref 150–400)
RBC: 3.02 MIL/uL — ABNORMAL LOW (ref 3.87–5.11)
RDW: 14.4 % (ref 11.5–15.5)
WBC: 8.6 10*3/uL (ref 4.0–10.5)
nRBC: 0 % (ref 0.0–0.2)

## 2022-11-30 LAB — MAGNESIUM: Magnesium: 2.4 mg/dL (ref 1.7–2.4)

## 2022-11-30 LAB — GLUCOSE, CAPILLARY
Glucose-Capillary: 112 mg/dL — ABNORMAL HIGH (ref 70–99)
Glucose-Capillary: 173 mg/dL — ABNORMAL HIGH (ref 70–99)
Glucose-Capillary: 186 mg/dL — ABNORMAL HIGH (ref 70–99)
Glucose-Capillary: 221 mg/dL — ABNORMAL HIGH (ref 70–99)
Glucose-Capillary: 347 mg/dL — ABNORMAL HIGH (ref 70–99)
Glucose-Capillary: 387 mg/dL — ABNORMAL HIGH (ref 70–99)

## 2022-11-30 LAB — FERRITIN: Ferritin: 142 ng/mL (ref 11–307)

## 2022-11-30 MED ORDER — PREDNISONE 50 MG PO TABS: 50.0000 mg | ORAL_TABLET | Freq: Every day | ORAL | Status: DC

## 2022-11-30 MED ORDER — MOMETASONE FURO-FORMOTEROL FUM 200-5 MCG/ACT IN AERO
2.0000 | INHALATION_SPRAY | Freq: Two times a day (BID) | RESPIRATORY_TRACT | Status: DC
  Administered 2022-11-30 – 2022-12-02 (×4): 2 via RESPIRATORY_TRACT
  Filled 2022-11-30: qty 8.8

## 2022-11-30 MED ORDER — TECHNETIUM TO 99M ALBUMIN AGGREGATED
4.0000 | Freq: Once | INTRAVENOUS | Status: AC | PRN
  Administered 2022-11-30: 5.12 via INTRAVENOUS

## 2022-11-30 MED ORDER — ALBUTEROL SULFATE HFA 108 (90 BASE) MCG/ACT IN AERS
2.0000 | INHALATION_SPRAY | RESPIRATORY_TRACT | Status: DC | PRN
  Administered 2022-11-30 – 2022-12-01 (×2): 2 via RESPIRATORY_TRACT

## 2022-11-30 MED ORDER — IPRATROPIUM-ALBUTEROL 20-100 MCG/ACT IN AERS
1.0000 | INHALATION_SPRAY | Freq: Four times a day (QID) | RESPIRATORY_TRACT | Status: DC
  Administered 2022-11-30 – 2022-12-02 (×8): 1 via RESPIRATORY_TRACT
  Filled 2022-11-30: qty 4

## 2022-11-30 MED ORDER — ALBUTEROL SULFATE HFA 108 (90 BASE) MCG/ACT IN AERS
2.0000 | INHALATION_SPRAY | Freq: Four times a day (QID) | RESPIRATORY_TRACT | Status: DC
  Filled 2022-11-30: qty 6.7

## 2022-11-30 NOTE — TOC Progression Note (Signed)
Transition of Care [facility]) - Progression Note    Patient Details  Name: Alexandria [name] MRN: [id number] Date of Birth: [birth year]  [facility] CM/SW Contact  Truddie Hidden, RN Phone Number: 11/30/2022, 2:44 PM  Clinical Narrative:    Left a message for admissions at [facility] regarding any additional needed documentation for patient's return. No answer. Left a message.         Expected Discharge Plan and Services                                               Social Determinants of Health (SDOH) Interventions SDOH Screenings   Food Insecurity: No Food Insecurity (11/17/2022)  Housing: Low Risk  (11/17/2022)  Transportation Needs: No Transportation Needs (11/17/2022)  Utilities: Not At Risk (11/17/2022)  Alcohol Screen: Low Risk  (10/21/2022)  Depression (PHQ2-9): Low Risk  (10/21/2022)  Recent Concern: Depression (PHQ2-9) - Medium Risk (08/03/2022)  Financial Resource Strain: Low Risk  (10/21/2022)  Physical Activity: Insufficiently Active (10/21/2022)  Social Connections: Socially Isolated (10/21/2022)  Stress: No Stress Concern Present (10/21/2022)  Tobacco Use: Medium Risk (11/28/2022)  Health Literacy: Adequate Health Literacy (10/21/2022)    Readmission Risk Interventions    11/18/2022    4:47 PM  Readmission Risk Prevention Plan  Transportation Screening Complete  Medication Review (RN Care Manager) Complete  HRI or Home Care Consult Complete  SW Recovery Care/Counseling Consult Complete  Palliative Care Screening Not Applicable  Skilled Nursing Facility Complete

## 2022-11-30 NOTE — Evaluation (Signed)
Occupational Therapy Evaluation Patient Details Name: Alexandria [name] MRN: [id number] DOB: 03-17-1935 Today's Date: 11/30/2022   History of Present Illness Patient is a 87 year old female with acute on chronic hypoxemic respiratory failure, positive for COVID 19. History of COPD requiring CPAP, HTN, HDL, DM, CAD, PVD, CHF, hypothyroidism, depression with anxiety, CKD-3, A-fib, obesity. Recent hospital stay for hypercapnic encephalopathy with moderate COPD exacerbation with discharge to rehab.   Clinical Impression   Pt was seen for OT evaluation this date. Pt was in bed apon arrival to room. PTA Pt at rehab, assist for some ADLs, amb with RW household distances. Pt was alert and oriented x3, mistaking the date by 3 days. Pt presents to acute OT demonstrating impaired ADL performance and functional mobility (See OT problem list for additional functional deficits). Bed mobility Pt requires MIN A to bring legs to EOB. Pt currently requires CCA + verbal cues for sequencing and safety during EOB <> standing t/f + RW amb ~16ft within room. SET UP for grooming tasks, SET UP for UB dressing. Pt left in recliner at end of session. Pt vitals were monitored during tx, currently on 2L via nasal cannula. Pt able to maintain SpO2 > 91% after mobility. Pt would benefit from skilled OT services to address noted impairments and functional limitations (see below for any additional details) in order to maximize safety and independence while minimizing falls risk and caregiver burden. OT will follow acutely.      If plan is discharge home, recommend the following: A little help with walking and/or transfers;A little help with bathing/dressing/bathroom;Assistance with cooking/housework;Assist for transportation;Help with stairs or ramp for entrance;Direct supervision/assist for financial management;Direct supervision/assist for medications management    Functional Status Assessment  Patient has had a recent decline in  their functional status and demonstrates the ability to make significant improvements in function in a reasonable and predictable amount of time.  Equipment Recommendations  None recommended by OT    Recommendations for Other Services       Precautions / Restrictions Precautions Precautions: Fall Restrictions Weight Bearing Restrictions: No      Mobility Bed Mobility Overal bed mobility: Needs Assistance Bed Mobility: Supine to Sit     Supine to sit: Min assist     General bed mobility comments: Needed LE assists for getting to EOB Patient Response: Impulsive  Transfers Overall transfer level: Needs assistance Equipment used: Rolling walker (2 wheels) Transfers: Sit to/from Stand Sit to Stand: Contact guard assist                  Balance Overall balance assessment: Needs assistance Sitting-balance support: Feet supported, Single extremity supported Sitting balance-[name]: Good Sitting balance - Comments: Able to maintain seated EOB balance with 1 UE support during vitals   Standing balance support: During functional activity, Reliant on assistive device for balance, Bilateral upper extremity supported Standing balance-[name]: Fair Impaired gate pattern noted during amb.                            ADL either performed or assessed with clinical judgement   ADL Overall ADL's : Needs assistance/impaired                 Upper Body Dressing : Set up;Sitting Upper Body Dressing Details (indicate cue type and reason): Gown     Toilet Transfer: Contact guard assist;Rolling walker (2 wheels);Cueing for safety;Cueing for sequencing Toilet Transfer Details (indicate  cue type and reason): Simulated BSC transfer ~ bed to recliner + RW         Functional mobility during ADLs: Cueing for safety;Cueing for sequencing;Rolling walker (2 wheels) General ADL Comments: Pt seems to be improving cognition     Vision         Perception          Praxis         Pertinent Vitals/Pain Pain Assessment Pain Assessment: No/denies pain     Extremity/Trunk Assessment Upper Extremity Assessment Upper Extremity Assessment: Overall WFL for tasks assessed   Lower Extremity Assessment Lower Extremity Assessment: Generalized weakness (right dorsiflexion weakness (patient reports she has at baseline))       Communication Communication Communication: No apparent difficulties Cueing Techniques: Verbal cues   Cognition Arousal: Alert Behavior During Therapy: WFL for tasks assessed/performed Overall Cognitive Status: Within Functional Limits for tasks assessed                                 General Comments: Alert and oriented x3 Incorrect date but only off by 3 days     General Comments  mild dyspnea initially with sititng upright on edge of bed with cues for breathing techniques and taking rest breaks as needed    Exercises Other Exercises Other Exercises: Edu: Role of rehab   Shoulder Instructions      Home Living Family/patient expects to be discharged to:: Private residence Living Arrangements: Alone Available Help at Discharge: Personal care attendant;Other (Comment);Available PRN/intermittently   Home Access: Ramped entrance     Home Layout: Multi-level;Able to live on main level with bedroom/bathroom     Bathroom Shower/Tub: Sponge bathes at baseline   Bathroom Toilet: Standard     Home Equipment: Rollator (4 wheels);Wheelchair - Forensic psychologist (2 wheels);Grab bars - toilet   Additional Comments: patient recetly at rehab but was living alone with personal caregiver at night prior to that      Prior Functioning/Environment Prior Level of Function : Needs assist       Physical Assist : Mobility (physical);ADLs (physical) Mobility (physical): Bed mobility;Transfers;Gait ADLs (physical): Bathing;Dressing;Toileting;IADLs Mobility Comments: using four wheeled walker for  ambulating short distances ADLs Comments:Per chart prior to previous hospitalization and subsequent rehab stay, ambulates short distances to bathroom and kitchen with rollator and performs sink bath herself. Caregiver stays at night for safety. Family assists with IADLs such as groceries and medication management.        OT Problem List: Decreased strength;Decreased activity tolerance;Decreased safety awareness;Impaired balance (sitting and/or standing);Decreased knowledge of use of DME or AE      OT Treatment/Interventions: Self-care/ADL training;Therapeutic exercise;Therapeutic activities;Energy conservation;DME and/or AE instruction;Patient/family education;Balance training    OT Goals(Current goals can be found in the care plan section) Acute Rehab OT Goals Patient Stated Goal: To return home OT Goal Formulation: With patient Time For Goal Achievement: 12/14/22 Potential to Achieve Goals: Good ADL Goals Pt Will Perform Grooming: with modified independence;sitting Pt Will Perform Lower Body Dressing: with modified independence;sit to/from stand Pt Will Transfer to Toilet: with modified independence;ambulating;regular height toilet Pt Will Perform Toileting - Clothing Manipulation and hygiene: with modified independence;sitting/lateral leans;sit to/from stand  OT Frequency: Min 1X/week    Co-evaluation PT/OT/SLP Co-Evaluation/Treatment: Yes Reason for Co-Treatment: Complexity of the patient's impairments (multi-system involvement);To address functional/ADL transfers PT goals addressed during session: Mobility/safety with mobility OT goals addressed during session: ADL's and self-care  AM-PAC OT "6 Clicks" Daily Activity     Outcome Measure Help from another person eating meals?: None Help from another person taking care of personal grooming?: A Little Help from another person toileting, which includes using toliet, bedpan, or urinal?: A Little Help from another person bathing  (including washing, rinsing, drying)?: A Little Help from another person to put on and taking off regular upper body clothing?: A Little Help from another person to put on and taking off regular lower body clothing?: A Lot 6 Click Score: 18   End of Session Equipment Utilized During Treatment: Oxygen;Rolling walker (2 wheels)  Activity Tolerance: Patient tolerated treatment well Patient left: in chair;with call bell/phone within reach;with chair alarm set  OT Visit Diagnosis: Unsteadiness on feet (R26.81);Muscle weakness (generalized) (M62.81);History of falling (Z91.81)                Time: 1610-9604 OT Time Calculation (min): 22 min Charges:  OT General Charges $OT Visit: 1 Visit OT Evaluation $OT Eval Low Complexity: 1 Low  [employer], OTS

## 2022-11-30 NOTE — Evaluation (Signed)
Physical Therapy Evaluation Patient Details Name: Alexandria [name] MRN: [id number] DOB: Mar 10, 1935 Today's Date: 11/30/2022  History of Present Illness  Patient is a 87 year old female with acute on chronic hypoxemic respiratory failure, positive for COVID 19. History of COPD requiring CPAP, HTN, HDL, DM, CAD, PVD, CHF, hypothyroidism, depression with anxiety, CKD-3, A-fib, obesity. Recent hospital stay for hypercapnic encephalopathy with moderate COPD exacerbation with discharge to rehab.  Clinical Impression  Patient is agreeable to PT evaluation. She reports her breathing is much improved since yesterday. She is usually ambulatory with a four wheeled walker and has been at rehab recently.  Patient required assistance for bed mobility. She is mildly impulsive with activity and needs cues for safety. Contact guard assistance provided for standing from bed and ambulating a short distance in the room with rolling walker. Vitals stable on 2 L02. Encouraged rest breaks as needed as patient has mild dyspnea with exertion initially with sitting upright. Recommend to continue PT to maximize independence and facilitate return to prior level of function. Anticipate patient could benefit from rehabilitation <3 hours/day after this hospital stay.       If plan is discharge home, recommend the following: A little help with walking and/or transfers;A little help with bathing/dressing/bathroom;Assistance with cooking/housework;Help with stairs or ramp for entrance;Assist for transportation   Can travel by private vehicle   Yes    Equipment Recommendations None recommended by PT  Recommendations for Other Services       Functional Status Assessment Patient has had a recent decline in their functional status and demonstrates the ability to make significant improvements in function in a reasonable and predictable amount of time.     Precautions / Restrictions Precautions Precautions:  Fall Restrictions Weight Bearing Restrictions: No      Mobility  Bed Mobility Overal bed mobility: Needs Assistance Bed Mobility: Supine to Sit     Supine to sit: Min assist     General bed mobility comments: assistance for LE support. verbal cues for technique    Transfers Overall transfer level: Needs assistance Equipment used: Rolling walker (2 wheels) Transfers: Sit to/from Stand Sit to Stand: Contact guard assist           General transfer comment: verbal cues for technique    Ambulation/Gait Ambulation/Gait assistance: Contact guard assist Gait Distance (Feet): 25 Feet Assistive device: Rolling walker (2 wheels) Gait Pattern/deviations: Step-through pattern Gait velocity: decreased     General Gait Details: heavy reliance on rolling walker for support in standing. patient is fatigued with activity with rest breaks encouraged as needed  Stairs            Wheelchair Mobility     Tilt Bed    Modified Rankin (Stroke Patients Only)       Balance Overall balance assessment: Needs assistance Sitting-balance support: Feet supported, Single extremity supported Sitting balance-[name]: Good Sitting balance - Comments: no loss of balance   Standing balance support: During functional activity, Reliant on assistive device for balance, Bilateral upper extremity supported Standing balance-[name]: Fair Standing balance comment: reliance on rolling walker for support in standing                             Pertinent Vitals/Pain Pain Assessment Pain Assessment: No/denies pain    Home Living Family/patient expects to be discharged to:: Private residence Living Arrangements: Alone Available Help at Discharge: Personal care attendant;Other (Comment);Available PRN/intermittently   Home  Access: Ramped entrance       Home Layout: Multi-level;Able to live on main level with bedroom/bathroom Home Equipment: Rollator (4  wheels);Wheelchair - Forensic psychologist (2 wheels);Grab bars - toilet Additional Comments: patient recetly at rehab but was living alone with personal caregiver at night prior to that    Prior Function Prior Level of Function : Needs assist       Physical Assist : Mobility (physical);ADLs (physical) Mobility (physical): Bed mobility;Transfers;Gait ADLs (physical): Bathing;Dressing;Toileting;IADLs Mobility Comments: using four wheeled walker for ambulating short distances ADLs Comments: Pt ambulates short distances to bathroom and kitchen with rollator and performs sink bath herself. Caregiver stays at night for safety. Family assists with IADLs such as groceries and medication management.     Extremity/Trunk Assessment   Upper Extremity Assessment Upper Extremity Assessment: Overall WFL for tasks assessed    Lower Extremity Assessment Lower Extremity Assessment: Generalized weakness (right dorsiflexion weakness (patient reports she has at baseline))       Communication   Communication Communication: No apparent difficulties Cueing Techniques: Verbal cues  Cognition Arousal: Alert Behavior During Therapy: Impulsive Overall Cognitive Status: No family/caregiver present to determine baseline cognitive functioning                                 General Comments: grossly oriented (thought she was still in the ED and date was off by a few days). she is able to follow single step commands with increased time. mildly impulsive with mobility        General Comments General comments (skin integrity, edema, etc.): mild dyspnea initially with sititng upright on edge of bed with cues for breathing techniques and taking rest breaks as needed    Exercises     Assessment/Plan    PT Assessment Patient needs continued PT services  PT Problem List Decreased strength;Decreased activity tolerance;Decreased balance;Decreased mobility;Cardiopulmonary status limiting  activity       PT Treatment Interventions DME instruction;Gait training;Functional mobility training;Therapeutic activities;Therapeutic exercise;Balance training;Patient/family education    PT Goals (Current goals can be found in the Care Plan section)  Acute Rehab PT Goals Patient Stated Goal: to go home PT Goal Formulation: With patient Time For Goal Achievement: 12/14/22 Potential to Achieve Goals: Good    Frequency Min 1X/week     Co-evaluation PT/OT/SLP Co-Evaluation/Treatment: Yes Reason for Co-Treatment: Complexity of the patient's impairments (multi-system involvement);To address functional/ADL transfers PT goals addressed during session: Mobility/safety with mobility OT goals addressed during session: ADL's and self-care       AM-PAC PT "6 Clicks" Mobility  Outcome Measure Help needed turning from your back to your side while in a flat bed without using bedrails?: A Little Help needed moving from lying on your back to sitting on the side of a flat bed without using bedrails?: A Little Help needed moving to and from a bed to a chair (including a wheelchair)?: A Little Help needed standing up from a chair using your arms (e.g., wheelchair or bedside chair)?: A Little Help needed to walk in hospital room?: A Little Help needed climbing 3-5 steps with a railing? : A Lot 6 Click Score: 17    End of Session Equipment Utilized During Treatment: Oxygen (2L 02) Activity Tolerance: Patient tolerated treatment well Patient left: in chair (with OT in the room)   PT Visit Diagnosis: Unsteadiness on feet (R26.81);Muscle weakness (generalized) (M62.81)    Time: 1610-9604 PT Time Calculation (min) (ACUTE  ONLY): 15 min   Charges:   PT Evaluation $PT Eval Moderate Complexity: 1 Mod   PT General Charges $$ ACUTE PT VISIT: 1 Visit         Donna Bernard, PT, MPT   Ina Homes 11/30/2022, 11:48 AM

## 2022-11-30 NOTE — Progress Notes (Signed)
Patient admitted to room 253 from the ED. Chart reviewed p/t arrival. Patient is A&O x4 with no c/o pain or discomfort. Tele monitor applied and V/S obtained. Oriented to room and call bell use. Daughter at bedside for the night. Call bell within reach and bed low & locked.

## 2022-11-30 NOTE — Progress Notes (Signed)
Progress Note   Patient: Alexandria [name] DOB: 1935-03-27 DOA: 11/28/2022     1 DOS: the patient was seen and examined on 11/30/2022   Brief hospital course: HPI on admission 11/28/2022:  "Alexandria [name] is a 87 y.o. female with medical history significant of COPD requiring CPAP, HTN, HDL, DM, CAD, PVD, dCHF, hypothyroidism, depression with anxiety, CKD-3, A-fib not on anticoagulants, obesity, who presents with shortness breath."    See H&P for full HPI on admission and ED course.   Patient was admitted for acute on chronic respiratory failure with hypoxia and hypercapnia due to Covid-19 infection.  No evidence pneumonia on imaging. She was placed on BiPAP, started on IV steroids, given one dose IV Lasix.  Further hospital course and management as outlined below.  9/24 - notified by ED RN that pt's daughter declining IV steroids, stating "she just finished steroids".  RN also reports O2 desaturations down to 60's when briefly attempted off BiPAP on nasal cannula oxygen.  Patient's blood gas is improved from pH 7.29 >> 7.39 and pCO2 92 >> 72.  No mental status changes.    Daughter spoke with someone on her phone about two medications for treating Covid, then asked for me to start either ivermectin or hydroxychloroquine.  I explained that these are not treatments used for Covid, and have no data to support their use.  She declines remdesevir or Paxlovid, citing side effects or harms she's heard of.  Discussed in detail that steroids have the most data to support use in Covid with acute hypoxic respiratory failure both with or without pneumonia.  She ultimately agreed to IV steroids being continued.  She continued to press for starting off-label treatment as stated above, and asked for second opinion from Pulmonology, as Dr. Karna Christmas had previously seen patient during her recent admission.  9/25 - pt on baseline 2 L/min O2 today.  PT/OT evaluations pending for return to  SNF.   Assessment and Plan:  * Acute on chronic respiratory failure with hypoxia and hypercapnia (HCC) Covid-19 infection (without pneumonia on imaging on admission). Baseline O2 need is 2 L/min. 9/24 - RN reported spO2 desat into 60's when tried off BiPAP on [place] O2 --Continue BiPAP PRN --Maintain spO2 > 88% --Pulmonology consulted at daughter's request --Trend CRP  --Initially on  IV steroids (Solu-medrol 80 mg BID) --Transitioned to Prednisone 50 mg daily --Anticipate steroid-induced hyperglycemia - start sliding scale Novolog and adjust for inpatient goal CBG 140-180 --Diuresis as needed (one time IV lasix given on admission, home torsemide resumed) --Repeat Chest xray if worsening respiratory status or O2 requirements --Symptomatic care per orders --Lovenox for DVT prophylaxis  Acute respiratory disease due to COVID-19 virus .  COPD (chronic obstructive pulmonary disease) (HCC) Stable, not wheezing on exam.  Daughter reports pt recently completed steroids. --IV steroids >> prednisone as above --Pulmonology consulted --Scheduled Combivent & PRN albuterol inhalerts --Dulera  Chronic diastolic (congestive) heart failure (HCC) 2D echo on 06/30/2022 showed EF 60 to 65%.  BNP 141.5 on admission.  No pulm edema chest x-ray. Does have BLE edema, slightly worse on the right. --Gived 1 dose of IV Lasix 40 mg on admission --Continue home torsemide --Daily weights to monitor volume status --Monitor renal function, electrolytes --Check D-Dimer and if positive -- will get lower extremity doppler U/S to rule out DVT, given higher risk with Covid infection   Hyperlipemia --Crestor  Hypothyroidism --Synthroid  Essential hypertension --IV hydralazine as needed --Torsemide, bisoprolol  CAD (coronary artery  disease) Stable, no chest pain. Hs-troponin normal 11. --Crestor, Imdur  --Obtain stat EKG and check troponin if active chest pain   Paroxysmal atrial fibrillation (HCC) HR  in 50's --On bisoprolol --Not on anticoagulation due to anemia and high falls risk --Lovenox for VTE ppx in setting of Covid infection  Stage 3b chronic kidney disease (HCC) Stable. --Monitor BMP  Depression with anxiety --Continue home medications   Obesity (BMI 30-39.9) Body mass index is 39.44 kg/m. Complicates overall care and prognosis.  Recommend lifestyle modifications including physical activity and diet for weight loss and overall long-term health. Contributes to her chronic respiratory failure and increases risk of covid-19 complications.  Obstructive sleep apnea --Currently on BiPAP --Auto CPAP machine ordered at SNF upon d/c last time, but apparently was not at the facility. --[facility] is following to ensure machine at SNF --Pulmonology following at daughter's request  Iron deficiency anemia Hbg stable at 10.0 >> 9.5. Note pt required blood transfusion during recent admission. --Monitor Hbg --Transfusion threshold 7        Subjective: Pt seen awake resting in bed this AM. She called her daughter on speakerphone during encounter.  Pt reports she feels well, overall feels normal.  Reports a mild sore/scratchy throat but no congestion, fever/chills.  Mild baseline cough not worse.  No Gi symptoms or other acute complaints.  More alert today. States she wore Bipap overnight for 6.5 hours.   Physical Exam: Vitals:   11/30/22 0538 11/30/22 0823 11/30/22 1008 11/30/22 1156  BP: (!) 109/40 (!) 121/39  (!) 110/31  Pulse: (!) 55 (!) 55  (!) 59  Resp: 17   16  Temp:  97.8 F (36.6 C)  98.3 F (36.8 C)  TempSrc:      SpO2: 93% 98% 91% 100%  Weight:      Height:       General exam: awake, alert, no acute distress, obese HEENT: wearing nasal cannula, hearing grossly normal  Respiratory system: lungs clear, diminished but no audible wheezes or rhonchi, on 2 L/min [place] o2 with normal respiratory effort at rest. Cardiovascular system: normal S1/S2, RRR, unable to visualize  JVD due to body habitus, trace LE edema Gastrointestinal system: soft, NT, ND, no HSM felt, +bowel sounds. Central nervous system: A&O x 3. no gross focal neurologic deficits, normal speech Skin: dry, intact, normal temperature Psychiatry: normal mood, congruent affect   Data Reviewed:  Notable labs --- VBG stable pH 7.38 / pCO2 73  compensated Bicarb 37, Cl 95, BUN 44, Cr 1.33 >> 1.45, Ca 8.1, Hbg stable 10.0, normal WBC CRP 3.2 >> 1.9 Hbg 9.5 stable from 10  Pending - chest xray and NM perfusion scan  Family Communication: daughter updated in person 9/23, by phone 9/24  Disposition: Status is: Inpatient Remains inpatient appropriate because: severity of illness with ongoing respiratory failure as above, on IV therapies pending furhter improvement. Anticipate medically ready for d/c in 1-2 days if stable & ongoing improvement.    Planned Discharge Destination: Skilled nursing facility    Time spent: 42 minutes   Author: Pennie Banter, DO 11/30/2022 2:32 PM  For on call review [email or web address].

## 2022-11-30 NOTE — Significant Event (Signed)
Patient's blood sugar at 2142 in 300s. Dr. Arville Care notified. Verbal orders given to give sliding scale insulin. 20 units given. Recheck was still in the 300s. Will continue to monitor.

## 2022-11-30 NOTE — Consult Note (Signed)
PULMONOLOGY         Date: 11/30/2022,   MRN# [id number] Alexandria [name] 31-May-1935     AdmissionWeight: 107.5 kg                 CurrentWeight: 103.3 kg  Referring provider: Dr Denton Lank   CHIEF COMPLAINT:   Acute on chronic hypoxemic respiratory failure   HISTORY OF PRESENT ILLNESS   Alexandria [name] is a 87 y.o. female with medical history significant of COPD requiring CPAP, HTN, HDL, DM, CAD, PVD, dCHF, hypothyroidism, depression with anxiety, CKD-3, A-fib not on anticoagulants, obesity, who presents with shortness breath. She was recently hospitalized for hypercapnic encephalopathy with moderate COPD exacerbation and improved with new CPAP prescription.   Patient also had worsening anemia and required 2 unit of blood transfusion and IV iron infusion. Pt was discharged to rehab facility.  Patient was diagnosed with COVID19 infection during this admission.  Her family requests atypical therapy including hydroxychloroquin and ivermectin.   11/30/22- patient is off BIPAP she is lucid and on nasal canula.  She is eating and not feeling dyspneic.   PAST MEDICAL HISTORY   Past Medical History:  Diagnosis Date   Allergy, unspecified not elsewhere classified    Anxiety    Aortic stenosis, mild 03/30/2022   a. TTE 03/29/2022: EF 55-60, no RWMA, GR 1 DD, normal RVSF, mild aortic stenosis (mean 10, V-max 212 cm/s, DI 0.70)   Cataract    Chronic diastolic congestive heart failure (HCC)    a. 06/2022 Echo: EF 60-65%, no rwma, nl RV fxn, mild MR, mild AS.   COPD (chronic obstructive pulmonary disease) (HCC) 03/08/1998   PFTs 12/12/2002 FEV 1 1.42 (64%) ratio 58 with no better after B2 and DLCO75%; PFTs 11/20/09 FEV1 1.50 (73%) ratio 50 no better after B2 with DLCO 62%; Hfa 50% 11/20/2009 >75%, 01/13/10 p coaching   Coronary artery calcification seen on CT scan    a. 2012 Neg stress test.   Depression 12/07/1998   Diabetes mellitus type II 02/05/2006   Dr. Elvera Lennox with endo    Disorders of bursae and tendons in shoulder region, unspecified    Rotator cuff syndrome, right   E. coli bacteremia    Esophagitis    GERD (gastroesophageal reflux disease)    History of UTI    Hyperlipidemia 10/04/2000   Hypertension 03/08/1992   Hypothyroidism 03/08/1968   Iron deficiency anemia    Lung nodule    radiation starts 10-08-2019   Malignant neoplasm of right upper lobe of lung (HCC) 07/24/2019   Obesity    NOS   OSA (obstructive sleep apnea)    PSG 01/27/10 AHI 13, pt does not know CPAP settings   PAF (paroxysmal atrial fibrillation) (HCC)    a. 06/2022 in setting of UTI/encephalopathy-->converted on amio; b. CHA2DS2VASc = 6-->No OAC 2/2 chronic anemia/fall risk; c. 06/2022 Zio: Predominantly sinus bradycardia at 54 (3-158)'s.  1 brief run of SVT.  2.2% PVC burden.   Peripheral neuropathy    Likely due to DM per Dr. Tresa Endo hernia      SURGICAL HISTORY   Past Surgical History:  Procedure Laterality Date   ABD U/S  03/19/1999   Nml x2 foci in liver   ADENOSINE MYOVIEW  06/02/2007   Nml   CARDIOLITE PERSANTINE  08/24/2000   Nml   CAROTID U/S  08/24/2000   1-39% ICA stenosis   CAROTID U/S  06/02/2007   No apprec change  CARPAL TUNNEL RELEASE  12/1997   Right   CESAREAN SECTION     x2 Breech/ repeat   CHOLECYSTECTOMY  1997   COLONOSCOPY WITH PROPOFOL N/A 02/12/2016   Procedure: COLONOSCOPY WITH PROPOFOL;  Surgeon: Rachael Fee, MD;  Location: [facility];  Service: Endoscopy;  Laterality: N/A;   CT ABD W & PELVIS WO/W CM     Abd hemangiomas of liver, 1 cm R renal cyst   DENTAL SURGERY  2016   Implants   DEXA  07/03/2003   Nml   ESOPHAGOGASTRODUODENOSCOPY  12/05/1997   Nml (due to hoarseness)   ESOPHAGOGASTRODUODENOSCOPY (EGD) WITH PROPOFOL N/A 02/12/2016   Procedure: ESOPHAGOGASTRODUODENOSCOPY (EGD) WITH PROPOFOL;  Surgeon: Rachael Fee, MD;  Location: [facility];  Service: Endoscopy;  Laterality: N/A;   GALLBLADDER SURGERY     HERNIA  REPAIR  01/24/2009   Lap Ventr w/ Lysis of adhesions (Dr. Dwain Sarna)   knee arthroscopic surgery  years ago   right   ROTATOR CUFF REPAIR  1984   Right, [place]   SHOULDER OPEN ROTATOR CUFF REPAIR  02/08/2012   Procedure: ROTATOR CUFF REPAIR SHOULDER OPEN;  Surgeon: Drucilla Schmidt, MD;  Location: [facility];  Service: Orthopedics;  Laterality: Left;  Left Shoulder Open Anterior Acrominectomy Rotator Cuff Repair Open Distal Clavicle Resection ,tissue mend graft, and repair of biceps tendon   THUMB RELEASE  12/1997   Right   TONSILLECTOMY     TOTAL ABDOMINAL HYSTERECTOMY  1985   Due to dysmennorhea   US ECHOCARDIOGRAPHY  06/02/2007     FAMILY HISTORY   Family History  Problem Relation Age of Onset   Heart disease Mother    Thyroid disease Mother    Emphysema Father        One lung   Cystic fibrosis Sister    Hyperthyroidism Sister    Osteoarthritis Brother    Hyperthyroidism Brother    Esophageal cancer Neg Hx    Cancer Neg Hx        Head or neck   Colon cancer Neg Hx    Stomach cancer Neg Hx    Breast cancer Neg Hx      SOCIAL HISTORY   Social History   Tobacco Use   Smoking status: Former    Current packs/day: 0.00    Average packs/day: 2.0 packs/day for 50.0 years (100.0 ttl pk-yrs)    Types: Cigarettes    Start date: 02/06/1955    Quit date: 02/05/2005    Years since quitting: 17.8   Smokeless tobacco: Never  Vaping Use   Vaping status: Never Used  Substance Use Topics   Alcohol use: Yes    Comment: occasionally   Drug use: Never     MEDICATIONS    Home Medication:     Current Medication:  Current Facility-Administered Medications:    acetaminophen (TYLENOL) tablet 650 mg, 650 mg, Oral, Q6H PRN, Lorretta Harp, MD   albuterol (VENTOLIN HFA) 108 (90 Base) MCG/ACT inhaler 2 puff, 2 puff, Inhalation, Q4H PRN, Esaw Grandchild A, DO   amiodarone (PACERONE) tablet 200 mg, 200 mg, Oral, Daily, Lorretta Harp, MD, 200 mg at 11/30/22 1223   ascorbic acid  (VITAMIN C) tablet 500 mg, 500 mg, Oral, Daily, Bernadean Saling, MD, 500 mg at 11/30/22 1222   bisoprolol (ZEBETA) tablet 2.5 mg, 2.5 mg, Oral, Daily, Lorretta Harp, MD, 2.5 mg at 11/30/22 1230   busPIRone (BUSPAR) tablet 15 mg, 15 mg, Oral, TID, Lorretta Harp, MD, 15 mg at 11/30/22  1222   cholecalciferol (VITAMIN D3) 25 MCG (1000 UNIT) tablet 2,000 Units, 2,000 Units, Oral, Daily, Lorretta Harp, MD, 2,000 Units at 11/30/22 1222   cyanocobalamin (VITAMIN B12) tablet 2,000 mcg, 2,000 mcg, Oral, Daily, Lorretta Harp, MD, 2,000 mcg at 11/30/22 1223   dextromethorphan-guaiFENesin (MUCINEX DM) 30-600 MG per 12 hr tablet 1 tablet, 1 tablet, Oral, BID PRN, Lorretta Harp, MD   enoxaparin (LOVENOX) injection 55 mg, 0.5 mg/kg, Subcutaneous, Q24H, Hunt, Madison H, RPH   ezetimibe (ZETIA) tablet 10 mg, 10 mg, Oral, QHS, Lorretta Harp, MD, 10 mg at 11/29/22 2303   ferrous sulfate tablet 325 mg, 325 mg, Oral, Daily, Lorretta Harp, MD, 325 mg at 11/30/22 1223   FLUoxetine (PROZAC) capsule 20 mg, 20 mg, Oral, BID, Lorretta Harp, MD, 20 mg at 11/30/22 1222   hydrALAZINE (APRESOLINE) injection 5 mg, 5 mg, Intravenous, Q2H PRN, Lorretta Harp, MD   insulin aspart (novoLOG) injection 0-20 Units, 0-20 Units, Subcutaneous, TID AC & HS, Mansy, Vernetta Honey, MD, 20 Units at 11/30/22 0048   Ipratropium-Albuterol (COMBIVENT) respimat 1 puff, 1 puff, Inhalation, QID, Esaw Grandchild A, DO, 1 puff at 11/30/22 1232   isosorbide mononitrate (IMDUR) 24 hr tablet 30 mg, 30 mg, Oral, Daily, Lorretta Harp, MD, 30 mg at 11/30/22 1221   levothyroxine (SYNTHROID) tablet 125 mcg, 125 mcg, Oral, Q0600, Lorretta Harp, MD, 125 mcg at 11/30/22 0644   loratadine (CLARITIN) tablet 10 mg, 10 mg, Oral, Daily PRN, Lorretta Harp, MD   methylPREDNISolone sodium succinate (SOLU-MEDROL) 40 mg/mL injection 40 mg, 40 mg, Intravenous, Q12H, Jonette Wassel, MD   mometasone-formoterol (DULERA) 200-5 MCG/ACT inhaler 2 puff, 2 puff, Inhalation, BID, Esaw Grandchild A, DO   nitroGLYCERIN  (NITROSTAT) SL tablet 0.4 mg, 0.4 mg, Sublingual, Q5 min PRN, Lorretta Harp, MD   ondansetron [facility]) injection 4 mg, 4 mg, Intravenous, Q8H PRN, Lorretta Harp, MD, 4 mg at 11/28/22 2341   pantoprazole (PROTONIX) EC tablet 40 mg, 40 mg, Oral, Daily, Lorretta Harp, MD, 40 mg at 11/30/22 1222   polyethylene glycol (MIRALAX / GLYCOLAX) packet 17 g, 17 g, Oral, Daily, Lorretta Harp, MD, 17 g at 11/30/22 1219   rosuvastatin (CRESTOR) tablet 10 mg, 10 mg, Oral, Raeanne Gathers, MD, 10 mg at 11/30/22 1221   senna-docusate (Senokot-S) tablet 1 tablet, 1 tablet, Oral, QHS, Lorretta Harp, MD, 1 tablet at 11/29/22 2303   torsemide (DEMADEX) tablet 40 mg, 40 mg, Oral, Daily, Lorretta Harp, MD, 40 mg at 11/30/22 1221   zinc sulfate capsule 220 mg, 220 mg, Oral, Daily, Karna Christmas, Tedi Hughson, MD, 220 mg at 11/30/22 1223  Facility-Administered Medications Ordered in Other Encounters:    0.9 %  sodium chloride infusion, , Intravenous, Once, Brahmanday, Govinda R, MD   0.9 %  sodium chloride infusion, , Intravenous, Continuous, Louretta Shorten R, MD, Last Rate: 20 mL/hr at 11/02/22 1421, New Bag at 11/02/22 1421    ALLERGIES   Antihistamines, diphenhydramine-type; Incruse ellipta [umeclidinium bromide]; Clarithromycin; Codeine; Diphenhydramine; Pregabalin; Sulfonamide derivatives; Tiotropium bromide monohydrate; Umeclidinium; and Vraylar [cariprazine]     REVIEW OF SYSTEMS    Review of Systems:  Gen:  Denies  fever, sweats, chills weigh loss  HEENT: Denies blurred vision, double vision, ear pain, eye pain, hearing loss, nose bleeds, sore throat Cardiac:  No dizziness, chest pain or heaviness, chest tightness,edema Resp:   reports dyspnea chronically  Gi: Denies swallowing difficulty, stomach pain, nausea or vomiting, diarrhea, constipation, bowel incontinence Gu:  Denies bladder incontinence, burning urine Ext:   Denies  Joint pain, stiffness or swelling Skin: Denies  skin rash, easy bruising or bleeding or  hives Endoc:  Denies polyuria, polydipsia , polyphagia or weight change Psych:   Denies depression, insomnia or hallucinations   Other:  All other systems negative   VS: BP (!) 110/31 (BP Location: Right Arm)   Pulse (!) 59   Temp 98.3 F (36.8 C)   Resp 16   Ht 5\' 5"  (1.651 m)   Wt 103.3 kg   LMP  (LMP Unknown)   SpO2 100%   BMI 37.90 kg/m      PHYSICAL EXAM    GENERAL:NAD, no fevers, chills, no weakness no fatigue HEAD: Normocephalic, atraumatic.  EYES: Pupils equal, round, reactive to light. Extraocular muscles intact. No scleral icterus.  MOUTH: Moist mucosal membrane. Dentition intact. No abscess noted.  EAR, NOSE, THROAT: Clear without exudates. No external lesions.  NECK: Supple. No thyromegaly. No nodules. No JVD.  PULMONARY: decreased breath sounds with mild rhonchi worse at bases bilaterally.  CARDIOVASCULAR: S1 and S2. Regular rate and rhythm. No murmurs, rubs, or gallops. No edema. Pedal pulses 2+ bilaterally.  GASTROINTESTINAL: Soft, nontender, nondistended. No masses. Positive bowel sounds. No hepatosplenomegaly.  MUSCULOSKELETAL: No swelling, clubbing, or edema. Range of motion full in all extremities.  NEUROLOGIC: Cranial nerves II through XII are intact. No gross focal neurological deficits. Sensation intact. Reflexes intact.  SKIN: No ulceration, lesions, rashes, or cyanosis. Skin warm and dry. Turgor intact.  PSYCHIATRIC: Mood, affect within normal limits. The patient is awake, alert and oriented x 3. Insight, judgment intact.       IMAGING   @IMAGES @   ASSESSMENT/PLAN    Acute COVID19 infection -Remdesevir antiviral - not indicated -vitamin C -zinc -IV steroids to prednisone with taper  -Diuresis - torsemice 20 IV - monitor UOP - utilize external urinary catheter if possible -encourage to use IS and Acapella device for bronchopulmonary hygiene when able -PT/OT when possible -procalcitonin, CRP and ferritin trending-mildly  elevated   Advanced copd with chronic hypoxemia  Agree with steroids and nebulizer therapy per COPD care path            Thank you for allowing me to participate in the care of this patient.   Patient/Family are satisfied with care plan and all questions have been answered.    Provider disclosure: Patient with at least one acute or chronic illness or injury that poses a threat to life or bodily function and is being managed actively during this encounter.  All of the below services have been performed independently by signing provider:  review of prior documentation from internal and or external health records.  Review of previous and current lab results.  Interview and comprehensive assessment during patient visit today. Review of current and previous chest radiographs/CT scans. Discussion of management and test interpretation with health care team and patient/family.   This document was prepared using Dragon voice recognition software and may include unintentional dictation errors.     Vida Rigger, M.D.  Division of Pulmonary & Critical Care Medicine

## 2022-12-01 DIAGNOSIS — J9621 Acute and chronic respiratory failure with hypoxia: Secondary | ICD-10-CM | POA: Diagnosis not present

## 2022-12-01 DIAGNOSIS — J9622 Acute and chronic respiratory failure with hypercapnia: Secondary | ICD-10-CM | POA: Diagnosis not present

## 2022-12-01 LAB — GLUCOSE, CAPILLARY
Glucose-Capillary: 85 mg/dL (ref 70–99)
Glucose-Capillary: 145 mg/dL — ABNORMAL HIGH (ref 70–99)
Glucose-Capillary: 162 mg/dL — ABNORMAL HIGH (ref 70–99)
Glucose-Capillary: 188 mg/dL — ABNORMAL HIGH (ref 70–99)

## 2022-12-01 LAB — BASIC METABOLIC PANEL
Anion gap: 7 (ref 5–15)
BUN: 49 mg/dL — ABNORMAL HIGH (ref 8–23)
CO2: 39 mmol/L — ABNORMAL HIGH (ref 22–32)
Calcium: 7.9 mg/dL — ABNORMAL LOW (ref 8.9–10.3)
Chloride: 96 mmol/L — ABNORMAL LOW (ref 98–111)
Creatinine, Ser: 1.54 mg/dL — ABNORMAL HIGH (ref 0.44–1.00)
GFR, Estimated: 33 mL/min — ABNORMAL LOW (ref >60)
Glucose, Bld: 118 mg/dL — ABNORMAL HIGH (ref 70–99)
Potassium: 3.3 mmol/L — ABNORMAL LOW (ref 3.5–5.1)
Sodium: 142 mmol/L (ref 135–145)

## 2022-12-01 LAB — CBC
HCT: 30.8 % — ABNORMAL LOW (ref 36.0–46.0)
Hemoglobin: 9.4 g/dL — ABNORMAL LOW (ref 12.0–15.0)
MCH: 31.5 pg (ref 26.0–34.0)
MCHC: 30.5 g/dL (ref 30.0–36.0)
MCV: 103.4 fL — ABNORMAL HIGH (ref 80.0–100.0)
Platelets: 155 10*3/uL (ref 150–400)
RBC: 2.98 MIL/uL — ABNORMAL LOW (ref 3.87–5.11)
RDW: 14.9 % (ref 11.5–15.5)
WBC: 8 10*3/uL (ref 4.0–10.5)
nRBC: 0 % (ref 0.0–0.2)

## 2022-12-01 LAB — FERRITIN: Ferritin: 107 ng/mL (ref 11–307)

## 2022-12-01 LAB — C-REACTIVE PROTEIN: CRP: 0.9 mg/dL (ref <1.0)

## 2022-12-01 MED ORDER — POTASSIUM CHLORIDE 20 MEQ PO PACK
40.0000 meq | PACK | Freq: Once | ORAL | Status: AC
  Administered 2022-12-01: 40 meq via ORAL
  Filled 2022-12-01: qty 2

## 2022-12-01 MED ORDER — PREDNISONE 50 MG PO TABS
45.0000 mg | ORAL_TABLET | Freq: Every day | ORAL | Status: DC
  Administered 2022-12-02: 45 mg via ORAL
  Filled 2022-12-01 (×2): qty 1

## 2022-12-01 NOTE — Progress Notes (Signed)
PULMONOLOGY         Date: 12/01/2022,   MRN# [id number] Alexandria [name] 13-Nov-1935     AdmissionWeight: 107.5 kg                 CurrentWeight: 103.4 kg  Referring provider: Dr Denton Lank   CHIEF COMPLAINT:   Acute on chronic hypoxemic respiratory failure   HISTORY OF PRESENT ILLNESS   KAIANA [name] is a 87 y.o. female with medical history significant of COPD requiring CPAP, HTN, HDL, DM, CAD, PVD, dCHF, hypothyroidism, depression with anxiety, CKD-3, A-fib not on anticoagulants, obesity, who presents with shortness breath. She was recently hospitalized for hypercapnic encephalopathy with moderate COPD exacerbation and improved with new CPAP prescription.   Patient also had worsening anemia and required 2 unit of blood transfusion and IV iron infusion. Pt was discharged to rehab facility.  Patient was diagnosed with COVID19 infection during this admission.  Her family requests atypical therapy including hydroxychloroquin and ivermectin.   11/30/22- patient is off BIPAP she is lucid and on nasal canula.  She is eating and not feeling dyspneic.   12/01/22- patient is stable on 2l/min [place].  She had PT and did well.  I spoke with Alexandria [name] her daughter and she is ready for dc to rehab. BMP with mild hypokalemia.   PAST MEDICAL HISTORY   Past Medical History:  Diagnosis Date   Allergy, unspecified not elsewhere classified    Anxiety    Aortic stenosis, mild 03/30/2022   a. TTE 03/29/2022: EF 55-60, no RWMA, GR 1 DD, normal RVSF, mild aortic stenosis (mean 10, V-max 212 cm/s, DI 0.70)   Cataract    Chronic diastolic congestive heart failure (HCC)    a. 06/2022 Echo: EF 60-65%, no rwma, nl RV fxn, mild MR, mild AS.   COPD (chronic obstructive pulmonary disease) (HCC) 03/08/1998   PFTs 12/12/2002 FEV 1 1.42 (64%) ratio 58 with no better after B2 and DLCO75%; PFTs 11/20/09 FEV1 1.50 (73%) ratio 50 no better after B2 with DLCO 62%; Hfa 50% 11/20/2009 >75%, 01/13/10 p coaching   Coronary  artery calcification seen on CT scan    a. 2012 Neg stress test.   Depression 12/07/1998   Diabetes mellitus type II 02/05/2006   Dr. Elvera Lennox with endo   Disorders of bursae and tendons in shoulder region, unspecified    Rotator cuff syndrome, right   E. coli bacteremia    Esophagitis    GERD (gastroesophageal reflux disease)    History of UTI    Hyperlipidemia 10/04/2000   Hypertension 03/08/1992   Hypothyroidism 03/08/1968   Iron deficiency anemia    Lung nodule    radiation starts 10-08-2019   Malignant neoplasm of right upper lobe of lung (HCC) 07/24/2019   Obesity    NOS   OSA (obstructive sleep apnea)    PSG 01/27/10 AHI 13, pt does not know CPAP settings   PAF (paroxysmal atrial fibrillation) (HCC)    a. 06/2022 in setting of UTI/encephalopathy-->converted on amio; b. CHA2DS2VASc = 6-->No OAC 2/2 chronic anemia/fall risk; c. 06/2022 Zio: Predominantly sinus bradycardia at 54 (3-158)'s.  1 brief run of SVT.  2.2% PVC burden.   Peripheral neuropathy    Likely due to DM per Dr. Tresa Endo hernia      SURGICAL HISTORY   Past Surgical History:  Procedure Laterality Date   ABD U/S  03/19/1999   Nml x2 foci in liver   ADENOSINE MYOVIEW  06/02/2007  Nml   CARDIOLITE PERSANTINE  08/24/2000   Nml   CAROTID U/S  08/24/2000   1-39% ICA stenosis   CAROTID U/S  06/02/2007   No apprec change    CARPAL TUNNEL RELEASE  12/1997   Right   CESAREAN SECTION     x2 [place]/ repeat   CHOLECYSTECTOMY  1997   COLONOSCOPY WITH PROPOFOL N/A 02/12/2016   Procedure: COLONOSCOPY WITH PROPOFOL;  Surgeon: Rachael Fee, MD;  Location: [facility];  Service: Endoscopy;  Laterality: N/A;   CT ABD W & PELVIS WO/W CM     Abd hemangiomas of liver, 1 cm R renal cyst   DENTAL SURGERY  2016   Implants   DEXA  07/03/2003   Nml   ESOPHAGOGASTRODUODENOSCOPY  12/05/1997   Nml (due to hoarseness)   ESOPHAGOGASTRODUODENOSCOPY (EGD) WITH PROPOFOL N/A 02/12/2016   Procedure:  ESOPHAGOGASTRODUODENOSCOPY (EGD) WITH PROPOFOL;  Surgeon: Rachael Fee, MD;  Location: [facility];  Service: Endoscopy;  Laterality: N/A;   GALLBLADDER SURGERY     HERNIA REPAIR  01/24/2009   Lap Ventr w/ Lysis of adhesions (Dr. Dwain Sarna)   knee arthroscopic surgery  years ago   right   ROTATOR CUFF REPAIR  1984   Right, [place]   SHOULDER OPEN ROTATOR CUFF REPAIR  02/08/2012   Procedure: ROTATOR CUFF REPAIR SHOULDER OPEN;  Surgeon: Drucilla Schmidt, MD;  Location: [facility];  Service: Orthopedics;  Laterality: Left;  Left Shoulder Open Anterior Acrominectomy Rotator Cuff Repair Open Distal Clavicle Resection ,tissue mend graft, and repair of biceps tendon   THUMB RELEASE  12/1997   Right   TONSILLECTOMY     TOTAL ABDOMINAL HYSTERECTOMY  1985   Due to dysmennorhea   US ECHOCARDIOGRAPHY  06/02/2007     FAMILY HISTORY   Family History  Problem Relation Age of Onset   Heart disease Mother    Thyroid disease Mother    Emphysema Father        One lung   Cystic fibrosis Sister    Hyperthyroidism Sister    Osteoarthritis Brother    Hyperthyroidism Brother    Esophageal cancer Neg Hx    Cancer Neg Hx        Head or neck   Colon cancer Neg Hx    Stomach cancer Neg Hx    Breast cancer Neg Hx      SOCIAL HISTORY   Social History   Tobacco Use   Smoking status: Former    Current packs/day: 0.00    Average packs/day: 2.0 packs/day for 50.0 years (100.0 ttl pk-yrs)    Types: Cigarettes    Start date: 02/06/1955    Quit date: 02/05/2005    Years since quitting: 17.8   Smokeless tobacco: Never  Vaping Use   Vaping status: Never Used  Substance Use Topics   Alcohol use: Yes    Comment: occasionally   Drug use: Never     MEDICATIONS    Home Medication:     Current Medication:  Current Facility-Administered Medications:    acetaminophen (TYLENOL) tablet 650 mg, 650 mg, Oral, Q6H PRN, Lorretta Harp, MD, 650 mg at 11/30/22 1941   albuterol (VENTOLIN HFA) 108 (90  Base) MCG/ACT inhaler 2 puff, 2 puff, Inhalation, Q4H PRN, Esaw Grandchild A, DO, 2 puff at 11/30/22 1944   amiodarone (PACERONE) tablet 200 mg, 200 mg, Oral, Daily, Lorretta Harp, MD, 200 mg at 12/01/22 1111   ascorbic acid (VITAMIN C) tablet 500 mg, 500 mg, Oral, Daily,  Vida Rigger, MD, 500 mg at 12/01/22 1111   bisoprolol (ZEBETA) tablet 2.5 mg, 2.5 mg, Oral, Daily, Lorretta Harp, MD, 2.5 mg at 12/01/22 1112   busPIRone (BUSPAR) tablet 15 mg, 15 mg, Oral, TID, Lorretta Harp, MD, 15 mg at 12/01/22 1111   cholecalciferol (VITAMIN D3) 25 MCG (1000 UNIT) tablet 2,000 Units, 2,000 Units, Oral, Daily, Lorretta Harp, MD, 2,000 Units at 12/01/22 1111   cyanocobalamin (VITAMIN B12) tablet 2,000 mcg, 2,000 mcg, Oral, Daily, Lorretta Harp, MD, 2,000 mcg at 12/01/22 1111   dextromethorphan-guaiFENesin (MUCINEX DM) 30-600 MG per 12 hr tablet 1 tablet, 1 tablet, Oral, BID PRN, Lorretta Harp, MD   enoxaparin (LOVENOX) injection 55 mg, 0.5 mg/kg, Subcutaneous, Q24H, Hunt, Madison H, RPH, 55 mg at 11/30/22 2133   ezetimibe (ZETIA) tablet 10 mg, 10 mg, Oral, QHS, Lorretta Harp, MD, 10 mg at 11/30/22 2133   ferrous sulfate tablet 325 mg, 325 mg, Oral, Daily, Lorretta Harp, MD, 325 mg at 12/01/22 1111   FLUoxetine (PROZAC) capsule 20 mg, 20 mg, Oral, BID, Lorretta Harp, MD, 20 mg at 12/01/22 1111   hydrALAZINE (APRESOLINE) injection 5 mg, 5 mg, Intravenous, Q2H PRN, Lorretta Harp, MD   insulin aspart (novoLOG) injection 0-20 Units, 0-20 Units, Subcutaneous, TID AC & HS, Mansy, Vernetta Honey, MD, 4 Units at 11/30/22 2140   Ipratropium-Albuterol (COMBIVENT) respimat 1 puff, 1 puff, Inhalation, QID, Esaw Grandchild A, DO, 1 puff at 12/01/22 0814   isosorbide mononitrate (IMDUR) 24 hr tablet 30 mg, 30 mg, Oral, Daily, Lorretta Harp, MD, 30 mg at 12/01/22 1111   levothyroxine (SYNTHROID) tablet 125 mcg, 125 mcg, Oral, Q0600, Lorretta Harp, MD, 125 mcg at 12/01/22 0523   loratadine (CLARITIN) tablet 10 mg, 10 mg, Oral, Daily PRN, Lorretta Harp, MD    mometasone-formoterol [facility]) 200-5 MCG/ACT inhaler 2 puff, 2 puff, Inhalation, BID, Esaw Grandchild A, DO, 2 puff at 12/01/22 1110   nitroGLYCERIN (NITROSTAT) SL tablet 0.4 mg, 0.4 mg, Sublingual, Q5 min PRN, Lorretta Harp, MD   ondansetron [facility]) injection 4 mg, 4 mg, Intravenous, Q8H PRN, Lorretta Harp, MD, 4 mg at 11/28/22 2341   pantoprazole (PROTONIX) EC tablet 40 mg, 40 mg, Oral, Daily, Lorretta Harp, MD, 40 mg at 12/01/22 1111   polyethylene glycol (MIRALAX / GLYCOLAX) packet 17 g, 17 g, Oral, Daily, Lorretta Harp, MD, 17 g at 12/01/22 1132   potassium chloride (KLOR-CON) packet 40 mEq, 40 mEq, Oral, Once, Djan, Prince T, MD   predniSONE (DELTASONE) tablet 50 mg, 50 mg, Oral, Q breakfast, Beonka Amesquita, MD   rosuvastatin (CRESTOR) tablet 10 mg, 10 mg, Oral, Raeanne Gathers, MD, 10 mg at 11/30/22 1221   senna-docusate (Senokot-S) tablet 1 tablet, 1 tablet, Oral, QHS, Lorretta Harp, MD, 1 tablet at 11/30/22 2134   torsemide (DEMADEX) tablet 40 mg, 40 mg, Oral, Daily, Lorretta Harp, MD, 40 mg at 12/01/22 1111   zinc sulfate capsule 220 mg, 220 mg, Oral, Daily, Vida Rigger, MD, 220 mg at 12/01/22 1111  Facility-Administered Medications Ordered in Other Encounters:    0.9 %  sodium chloride infusion, , Intravenous, Once, Brahmanday, Govinda R, MD   0.9 %  sodium chloride infusion, , Intravenous, Continuous, Louretta Shorten R, MD, Last Rate: 20 mL/hr at 11/02/22 1421, New Bag at 11/02/22 1421    ALLERGIES   Antihistamines, diphenhydramine-type; Incruse ellipta [umeclidinium bromide]; Clarithromycin; Codeine; Diphenhydramine; Pregabalin; Sulfonamide derivatives; Tiotropium bromide monohydrate; Umeclidinium; and Vraylar [cariprazine]     REVIEW OF SYSTEMS    Review of Systems:  Gen:  Denies  fever, sweats, chills weigh loss  HEENT: Denies blurred vision, double vision, ear pain, eye pain, hearing loss, nose bleeds, sore throat Cardiac:  No dizziness, chest pain or heaviness, chest  tightness,edema Resp:   reports dyspnea chronically  Gi: Denies swallowing difficulty, stomach pain, nausea or vomiting, diarrhea, constipation, bowel incontinence Gu:  Denies bladder incontinence, burning urine Ext:   Denies Joint pain, stiffness or swelling Skin: Denies  skin rash, easy bruising or bleeding or hives Endoc:  Denies polyuria, polydipsia , polyphagia or weight change Psych:   Denies depression, insomnia or hallucinations   Other:  All other systems negative   VS: BP (!) 116/51 (BP Location: Left Arm)   Pulse (!) 58   Temp 98.1 F (36.7 C)   Resp 19   Ht 5\' 5"  (1.651 m)   Wt 103.4 kg   LMP  (LMP Unknown)   SpO2 100%   BMI 37.94 kg/m      PHYSICAL EXAM    GENERAL:NAD, no fevers, chills, no weakness no fatigue HEAD: Normocephalic, atraumatic.  EYES: Pupils equal, round, reactive to light. Extraocular muscles intact. No scleral icterus.  MOUTH: Moist mucosal membrane. Dentition intact. No abscess noted.  EAR, NOSE, THROAT: Clear without exudates. No external lesions.  NECK: Supple. No thyromegaly. No nodules. No JVD.  PULMONARY: decreased breath sounds with mild rhonchi worse at bases bilaterally.  CARDIOVASCULAR: S1 and S2. Regular rate and rhythm. No murmurs, rubs, or gallops. No edema. Pedal pulses 2+ bilaterally.  GASTROINTESTINAL: Soft, nontender, nondistended. No masses. Positive bowel sounds. No hepatosplenomegaly.  MUSCULOSKELETAL: No swelling, clubbing, or edema. Range of motion full in all extremities.  NEUROLOGIC: Cranial nerves II through XII are intact. No gross focal neurological deficits. Sensation intact. Reflexes intact.  SKIN: No ulceration, lesions, rashes, or cyanosis. Skin warm and dry. Turgor intact.  PSYCHIATRIC: Mood, affect within normal limits. The patient is awake, alert and oriented x 3. Insight, judgment intact.       IMAGING   @IMAGES @   ASSESSMENT/PLAN    Acute COVID19 infection -Remdesevir antiviral - not  indicated -vitamin C -zinc -IV steroids to prednisone with taper  -Diuresis - torsemice 20 IV - monitor UOP - utilize external urinary catheter if possible -encourage to use IS and Acapella device for bronchopulmonary hygiene when able -PT/OT when possible -procalcitonin, CRP and ferritin trending-mildly elevated   Advanced copd with chronic hypoxemia  Agree with steroids and nebulizer therapy per COPD care path            Thank you for allowing me to participate in the care of this patient.   Patient/Family are satisfied with care plan and all questions have been answered.    Provider disclosure: Patient with at least one acute or chronic illness or injury that poses a threat to life or bodily function and is being managed actively during this encounter.  All of the below services have been performed independently by signing provider:  review of prior documentation from internal and or external health records.  Review of previous and current lab results.  Interview and comprehensive assessment during patient visit today. Review of current and previous chest radiographs/CT scans. Discussion of management and test interpretation with health care team and patient/family.   This document was prepared using Dragon voice recognition software and may include unintentional dictation errors.     Vida Rigger, M.D.  Division of Pulmonary & Critical Care Medicine

## 2022-12-01 NOTE — Progress Notes (Signed)
Progress Note   Patient: Alexandria [name] XBM:[id number] DOB: [birth year] DOA: 11/28/2022     2 DOS: the patient was seen and examined on 12/01/2022   Subjective: Patient seen and examined at bedside this morning Continues to be on 2 L of intranasal oxygen Denies nausea vomiting abdominal pain chest pain or cough  Brief hospital course: HPI on admission 11/28/2022:  "Alexandria [name] is a 87 y.o. female with medical history significant of COPD requiring CPAP, HTN, HDL, DM, CAD, PVD, dCHF, hypothyroidism, depression with anxiety, CKD-3, A-fib not on anticoagulants, obesity, who presents with shortness breath."     See H&P for full HPI on admission and ED course.   Patient was admitted for acute on chronic respiratory failure with hypoxia and hypercapnia due to Covid-19 infection.  No evidence pneumonia on imaging. She was placed on BiPAP, started on IV steroids, given one dose IV Lasix.   Further hospital course and management as outlined below.   9/24 - notified by ED RN that pt's daughter declining IV steroids, stating "she just finished steroids".  RN also reports O2 desaturations down to 60's when briefly attempted off BiPAP on nasal cannula oxygen.  Patient's blood gas is improved from pH 7.29 >> 7.39 and pCO2 92 >> 72.  No mental status changes.       Assessment and Plan:   * Acute on chronic respiratory failure with hypoxia and hypercapnia (HCC) Covid-19 infection (without pneumonia on imaging on admission). Baseline O2 need is 2 L/min. 9/24 - RN reported spO2 desat into 60's when tried off BiPAP on [place] O2 --Continue BiPAP PRN --Maintain spO2 > 88% --Pulmonology consulted at daughter's request --Trend CRP  --Initially on  IV steroids (Solu-medrol 80 mg BID) --Transitioned to Prednisone 50 mg daily --Anticipate steroid-induced hyperglycemia -patient started on sliding scale Novolog and adjust for inpatient goal CBG 140-180 --Diuresis as needed (one time IV lasix given on  admission, home torsemide resumed) --Repeat Chest xray if worsening respiratory status or O2 requirements --Symptomatic care per orders --Lovenox for DVT prophylaxis   Acute respiratory disease due to COVID-19 virus .  Continue management as above   COPD (chronic obstructive pulmonary disease) (HCC) Stable, not wheezing on exam.  Daughter reports pt recently completed steroids. --IV steroids >> prednisone as above -Pulmonary on board and case discussed --Scheduled Combivent & PRN albuterol inhalerts --Dulera   Chronic diastolic (congestive) heart failure (HCC) 2D echo on 06/30/2022 showed EF 60 to 65%.  BNP 141.5 on admission.  No pulm edema chest x-ray. Does have BLE edema, slightly worse on the right. --Gived 1 dose of IV Lasix 40 mg on admission --Continue home torsemide --Daily weights to monitor volume status --Monitor renal function, electrolytes --Check D-Dimer and if positive -- will get lower extremity doppler U/S to rule out DVT, given higher risk with Covid infection     Hyperlipemia -Continue Crestor   Hypothyroidism -- Continue Synthroid   Essential hypertension --IV hydralazine as needed --Torsemide, bisoprolol   CAD (coronary artery disease) Stable, no chest pain. Hs-troponin normal 11. --Crestor, Imdur  --Obtain stat EKG and check troponin if active chest pain     Paroxysmal atrial fibrillation (HCC) HR in 50's --On bisoprolol --Not on anticoagulation due to anemia and high falls risk --Lovenox for VTE ppx in setting of Covid infection   Stage 3b chronic kidney disease (HCC) Stable. --Monitor BMP   Depression with anxiety --Continue home medications    Obesity (BMI 30-39.9) Body mass index is 39.44 kg/m.  Complicates overall care and prognosis.  Recommend lifestyle modifications including physical activity and diet for weight loss and overall long-term health. Contributes to her chronic respiratory failure and increases risk of covid-19  complications.   Obstructive sleep apnea --Currently on BiPAP --Auto CPAP machine ordered at SNF upon d/c last time, but apparently was not at the facility. --[facility] is following to ensure machine at SNF --Pulmonology following at daughter's request   Iron deficiency anemia Hbg stable at 10.0 >> 9.5. Note pt required blood transfusion during recent admission. --Monitor Hbg --Transfusion threshold 7     Physical Exam: General exam: awake, alert, no acute distress, obese HEENT: wearing nasal cannula, hearing grossly normal  Respiratory system: lungs clear, diminished but no audible wheezes or rhonchi, on 2 L/min [place] o2 with normal respiratory effort at rest. Cardiovascular system: normal S1/S2, RRR, unable to visualize JVD due to body habitus, trace LE edema Gastrointestinal system: soft, NT, ND, no HSM felt, +bowel sounds. Central nervous system: A&O x 3. no gross focal neurologic deficits, normal speech Skin: dry, intact, normal temperature Psychiatry: normal mood, congruent affect     Data Reviewed: I have reviewed patient's lab results as shown below    Latest Ref Rng & Units 12/01/2022    3:43 AM 11/30/2022    7:25 AM 11/29/2022    5:39 AM  CBC  WBC 4.0 - 10.5 K/uL 8.0  8.6  9.7   Hemoglobin 12.0 - 15.0 g/dL 9.4  9.5  78.2   Hematocrit 36.0 - 46.0 % 30.8  30.8  33.0   Platelets 150 - 400 K/uL 155  167  153        Latest Ref Rng & Units 12/01/2022    3:43 AM 11/30/2022    7:25 AM 11/29/2022    5:39 AM  BMP  Glucose 70 - 99 mg/dL 956  70  213   BUN 8 - 23 mg/dL 49  44  37   Creatinine 0.44 - 1.00 mg/dL 0.86  5.78  4.69   Sodium 135 - 145 mmol/L 142  143  145   Potassium 3.5 - 5.1 mmol/L 3.3  3.8  4.4   Chloride 98 - 111 mmol/L 96  95  99   CO2 22 - 32 mmol/L 39  37  37   Calcium 8.9 - 10.3 mg/dL 7.9  8.1  8.3         Family Communication: daughter updated in person 9/23, by phone 9/24   Disposition: Status is: Inpatient Remains inpatient appropriate because: severity  of illness with ongoing respiratory failure as above, on IV therapies pending furhter improvement. Anticipate medically ready for d/c in 1-2 days if stable & ongoing improvement.      Planned Discharge Destination: Skilled nursing facility     Vitals:   12/01/22 0523 12/01/22 0932 12/01/22 1302 12/01/22 1614  BP: (!) 97/26 (!) 112/39 (!) 93/41 (!) 116/51  Pulse: (!) 52 (!) 58 (!) 51 (!) 58  Resp: 19     Temp: 97.9 F (36.6 C) 98.2 F (36.8 C) 98 F (36.7 C) 98.1 F (36.7 C)  TempSrc:      SpO2: 95% 100% 96% 100%  Weight:      Height:         Author: Loyce Dys, MD 12/01/2022 5:58 PM  For on call review [email or web address].

## 2022-12-01 NOTE — Progress Notes (Signed)
Physical Therapy Treatment Patient Details Name: Alexandria [name] MRN: [id number] DOB: March 22, 1935 Today's Date: 12/01/2022   History of Present Illness Patient is a 87 year old female with acute on chronic hypoxemic respiratory failure, positive for COVID 19. History of COPD requiring CPAP, HTN, HDL, DM, CAD, PVD, CHF, hypothyroidism, depression with anxiety, CKD-3, A-fib, obesity. Recent hospital stay for hypercapnic encephalopathy with moderate COPD exacerbation with discharge to rehab.    PT Comments  Pt was long sitting in bed upon arrival. She is A and O x 3 + agreeable to session. Was able to exit bed, stand, and ambulate in room on 2 L o2. Several occasions of staggering/LOB however only 1 LOB with intervention required. Pt is at high risk of falls. Agreeable to rehab at DC to maximize her independence and safety with all ADLs. DC recs remain appropriate.    If plan is discharge home, recommend the following: A little help with walking and/or transfers;A little help with bathing/dressing/bathroom;Assistance with cooking/housework;Help with stairs or ramp for entrance;Assist for transportation     Equipment Recommendations  None recommended by PT       Precautions / Restrictions Precautions Precautions: Fall     Mobility  Bed Mobility Overal bed mobility: Needs Assistance Bed Mobility: Supine to Sit  Supine to sit: Min assist   Transfers Overall transfer level: Needs assistance Equipment used: Rolling walker (2 wheels) Transfers: Sit to/from Stand Sit to Stand: Contact guard assist  General transfer comment: Pt was cued for improved technique and sequencing.    Ambulation/Gait Ambulation/Gait assistance: Contact guard assist Gait Distance (Feet): 65 Feet Assistive device: Rolling walker (2 wheels) Gait Pattern/deviations: Step-through pattern Gait velocity: decreased  General Gait Details: Pt was able to ambulate ~ 65 ft with RW in room. Does have unsteadiness with one  occasion of LOB.    Balance Overall balance assessment: Needs assistance Sitting-balance support: Feet supported, Single extremity supported Sitting balance-[name]: Good     Standing balance support: During functional activity, Reliant on assistive device for balance, Bilateral upper extremity supported Standing balance-[name]: Fair Standing balance comment: Does have unsteadiness during ambulation with one occasion of intervention to prevent falling      Cognition Arousal: Alert Behavior During Therapy: WFL for tasks assessed/performed Overall Cognitive Status: No family/caregiver present to determine baseline cognitive functioning    Safety/Judgement: Decreased awareness of safety, Decreased awareness of deficits     General Comments: Pt is alert but does present with some lethargy. Poor overall awareness of situation + prior admission/health concerns               Pertinent Vitals/Pain Pain Assessment Pain Assessment: No/denies pain     PT Goals (current goals can now be found in the care plan section) Acute Rehab PT Goals Patient Stated Goal: go to rehab then home Progress towards PT goals: Progressing toward goals    Frequency    Min 1X/week       Co-evaluation     PT goals addressed during session: Mobility/safety with mobility        AM-PAC PT "6 Clicks" Mobility   Outcome Measure  Help needed turning from your back to your side while in a flat bed without using bedrails?: A Little Help needed moving from lying on your back to sitting on the side of a flat bed without using bedrails?: A Little Help needed moving to and from a bed to a chair (including a wheelchair)?: A Little Help needed standing up  from a chair using your arms (e.g., wheelchair or bedside chair)?: A Little Help needed to walk in hospital room?: A Little Help needed climbing 3-5 steps with a railing? : A Lot 6 Click Score: 17    End of Session Equipment Utilized During  Treatment: Oxygen Activity Tolerance: Patient tolerated treatment well;Patient limited by fatigue Patient left: Other (comment) (on [facility] with RN staff aware) Nurse Communication: Mobility status PT Visit Diagnosis: Unsteadiness on feet (R26.81);Muscle weakness (generalized) (M62.81)     Time: 9147-8295 PT Time Calculation (min) (ACUTE ONLY): 15 min  Charges:    $Gait Training: 8-22 mins PT General Charges $$ ACUTE PT VISIT: 1 Visit                     Jetta Lout PTA 12/01/22, 4:29 PM

## 2022-12-01 NOTE — Progress Notes (Signed)
Occupational Therapy Treatment Patient Details Name: Alexandria [name] MRN: [id number] DOB: 01/31/1936 Today's Date: 12/01/2022   History of present illness Patient is a 87 year old female with acute on chronic hypoxemic respiratory failure, positive for COVID 19. History of COPD requiring CPAP, HTN, HDL, DM, CAD, PVD, CHF, hypothyroidism, depression with anxiety, CKD-3, A-fib, obesity. Recent hospital stay for hypercapnic encephalopathy with moderate COPD exacerbation with discharge to rehab.   OT comments  Upon entering the room, pt seated on EOB with nurse present in room getting medication ready. Pt is agreeable to OT intervention and has been talking to nurse about ambulation. OT assisted pt with donning gown while seated with min A. Pt needing CGA and min cues for hand placement and technique to stand from standard bed height. Pt ambulates 40' to nurses station while on 2Ls and then returns to room to sit in recliner chair. O2 saturation remains above 90 % but pt needing cues for pursed lip breathing techniques throughout. Pt remains seated in recliner chair with call bell and all needed items within reach.       If plan is discharge home, recommend the following:  A little help with walking and/or transfers;A little help with bathing/dressing/bathroom;Assistance with cooking/housework;Assist for transportation;Help with stairs or ramp for entrance;Direct supervision/assist for financial management;Direct supervision/assist for medications management   Equipment Recommendations  Other (comment) (defer)       Precautions / Restrictions Precautions Precautions: Fall       Mobility Bed Mobility               General bed mobility comments: Pt seated on EOB when therapist enters the room    Transfers Overall transfer level: Needs assistance Equipment used: Rolling walker (2 wheels) Transfers: Sit to/from Stand Sit to Stand: Contact guard assist                 Balance  Overall balance assessment: Needs assistance Sitting-balance support: Feet supported, Single extremity supported Sitting balance-[name]: Good Sitting balance - Comments: no loss of balance   Standing balance support: During functional activity, Reliant on assistive device for balance, Bilateral upper extremity supported Standing balance-[name]: Fair                             ADL either performed or assessed with clinical judgement   ADL Overall ADL's : Needs assistance/impaired                                       General ADL Comments: Pt dons hospital gown with min A    Extremity/Trunk Assessment Upper Extremity Assessment Upper Extremity Assessment: Overall WFL for tasks assessed   Lower Extremity Assessment Lower Extremity Assessment: Generalized weakness        Vision Patient Visual Report: No change from baseline            Cognition Arousal: Alert Behavior During Therapy: WFL for tasks assessed/performed Overall Cognitive Status: No family/caregiver present to determine baseline cognitive functioning                                 General Comments: Pt is pleasant and cooperative and follows commands with increased time  Pertinent Vitals/ Pain       Pain Assessment Pain Assessment: No/denies pain         Frequency  Min 1X/week        Progress Toward Goals  OT Goals(current goals can now be found in the care plan section)  Progress towards OT goals: Progressing toward goals      AM-PAC OT "6 Clicks" Daily Activity     Outcome Measure   Help from another person eating meals?: None Help from another person taking care of personal grooming?: A Little Help from another person toileting, which includes using toliet, bedpan, or urinal?: A Little Help from another person bathing (including washing, rinsing, drying)?: A Little Help from another person to put on and taking off  regular upper body clothing?: A Little Help from another person to put on and taking off regular lower body clothing?: A Lot 6 Click Score: 18    End of Session Equipment Utilized During Treatment: Oxygen;Rolling walker (2 wheels)  OT Visit Diagnosis: Unsteadiness on feet (R26.81);Muscle weakness (generalized) (M62.81);History of falling (Z91.81)   Activity Tolerance Patient tolerated treatment well   Patient Left in chair;with call bell/phone within reach;with chair alarm set   Nurse Communication Mobility status;Other (comment) (O2 saturation)        Time: 1610-9604 OT Time Calculation (min): 21 min  Charges: OT General Charges $OT Visit: 1 Visit OT Treatments $Therapeutic Activity: 8-22 mins  Jackquline Denmark, MS, OTR/L , CBIS ascom [phone]  12/01/22, 11:38 AM

## 2022-12-01 NOTE — Plan of Care (Signed)
Problem: Activity: Goal: Capacity to carry out activities will improve Outcome: Progressing   Problem: Cardiac: Goal: Ability to achieve and maintain adequate cardiopulmonary perfusion will improve Outcome: Progressing

## 2022-12-02 ENCOUNTER — Encounter: Payer: Self-pay | Admitting: Nurse Practitioner

## 2022-12-02 ENCOUNTER — Other Ambulatory Visit: Payer: Self-pay | Admitting: *Deleted

## 2022-12-02 ENCOUNTER — Ambulatory Visit: Payer: Medicare Other | Admitting: Nurse Practitioner

## 2022-12-02 ENCOUNTER — Telehealth: Payer: Self-pay | Admitting: Cardiovascular Disease

## 2022-12-02 DIAGNOSIS — R531 Weakness: Secondary | ICD-10-CM | POA: Diagnosis not present

## 2022-12-02 DIAGNOSIS — E538 Deficiency of other specified B group vitamins: Secondary | ICD-10-CM | POA: Diagnosis not present

## 2022-12-02 DIAGNOSIS — J9621 Acute and chronic respiratory failure with hypoxia: Secondary | ICD-10-CM | POA: Diagnosis not present

## 2022-12-02 DIAGNOSIS — D509 Iron deficiency anemia, unspecified: Secondary | ICD-10-CM | POA: Diagnosis not present

## 2022-12-02 DIAGNOSIS — J9811 Atelectasis: Secondary | ICD-10-CM | POA: Diagnosis not present

## 2022-12-02 DIAGNOSIS — N3000 Acute cystitis without hematuria: Secondary | ICD-10-CM | POA: Diagnosis not present

## 2022-12-02 DIAGNOSIS — R5383 Other fatigue: Secondary | ICD-10-CM | POA: Diagnosis not present

## 2022-12-02 DIAGNOSIS — J9611 Chronic respiratory failure with hypoxia: Secondary | ICD-10-CM | POA: Diagnosis not present

## 2022-12-02 DIAGNOSIS — J441 Chronic obstructive pulmonary disease with (acute) exacerbation: Secondary | ICD-10-CM | POA: Diagnosis not present

## 2022-12-02 DIAGNOSIS — D1803 Hemangioma of intra-abdominal structures: Secondary | ICD-10-CM | POA: Diagnosis not present

## 2022-12-02 DIAGNOSIS — N1832 Chronic kidney disease, stage 3b: Secondary | ICD-10-CM | POA: Diagnosis not present

## 2022-12-02 DIAGNOSIS — I5032 Chronic diastolic (congestive) heart failure: Secondary | ICD-10-CM | POA: Diagnosis not present

## 2022-12-02 DIAGNOSIS — J449 Chronic obstructive pulmonary disease, unspecified: Secondary | ICD-10-CM | POA: Diagnosis not present

## 2022-12-02 DIAGNOSIS — Z6836 Body mass index (BMI) 36.0-36.9, adult: Secondary | ICD-10-CM | POA: Diagnosis not present

## 2022-12-02 DIAGNOSIS — D500 Iron deficiency anemia secondary to blood loss (chronic): Secondary | ICD-10-CM | POA: Diagnosis not present

## 2022-12-02 DIAGNOSIS — E1122 Type 2 diabetes mellitus with diabetic chronic kidney disease: Secondary | ICD-10-CM | POA: Diagnosis not present

## 2022-12-02 DIAGNOSIS — E1151 Type 2 diabetes mellitus with diabetic peripheral angiopathy without gangrene: Secondary | ICD-10-CM | POA: Diagnosis present

## 2022-12-02 DIAGNOSIS — Z66 Do not resuscitate: Secondary | ICD-10-CM | POA: Diagnosis not present

## 2022-12-02 DIAGNOSIS — Z8616 Personal history of COVID-19: Secondary | ICD-10-CM | POA: Diagnosis not present

## 2022-12-02 DIAGNOSIS — K5730 Diverticulosis of large intestine without perforation or abscess without bleeding: Secondary | ICD-10-CM | POA: Diagnosis not present

## 2022-12-02 DIAGNOSIS — X58XXXA Exposure to other specified factors, initial encounter: Secondary | ICD-10-CM | POA: Diagnosis present

## 2022-12-02 DIAGNOSIS — M5459 Other low back pain: Secondary | ICD-10-CM | POA: Diagnosis not present

## 2022-12-02 DIAGNOSIS — J189 Pneumonia, unspecified organism: Secondary | ICD-10-CM | POA: Diagnosis not present

## 2022-12-02 DIAGNOSIS — F331 Major depressive disorder, recurrent, moderate: Secondary | ICD-10-CM | POA: Diagnosis not present

## 2022-12-02 DIAGNOSIS — D696 Thrombocytopenia, unspecified: Secondary | ICD-10-CM | POA: Diagnosis not present

## 2022-12-02 DIAGNOSIS — E8729 Other acidosis: Secondary | ICD-10-CM | POA: Diagnosis not present

## 2022-12-02 DIAGNOSIS — U071 COVID-19: Secondary | ICD-10-CM | POA: Diagnosis not present

## 2022-12-02 DIAGNOSIS — G4733 Obstructive sleep apnea (adult) (pediatric): Secondary | ICD-10-CM | POA: Diagnosis not present

## 2022-12-02 DIAGNOSIS — N390 Urinary tract infection, site not specified: Secondary | ICD-10-CM | POA: Diagnosis not present

## 2022-12-02 DIAGNOSIS — E1140 Type 2 diabetes mellitus with diabetic neuropathy, unspecified: Secondary | ICD-10-CM | POA: Diagnosis not present

## 2022-12-02 DIAGNOSIS — K5732 Diverticulitis of large intestine without perforation or abscess without bleeding: Secondary | ICD-10-CM | POA: Diagnosis present

## 2022-12-02 DIAGNOSIS — I480 Paroxysmal atrial fibrillation: Secondary | ICD-10-CM | POA: Diagnosis not present

## 2022-12-02 DIAGNOSIS — F419 Anxiety disorder, unspecified: Secondary | ICD-10-CM | POA: Diagnosis not present

## 2022-12-02 DIAGNOSIS — A0472 Enterocolitis due to Clostridium difficile, not specified as recurrent: Secondary | ICD-10-CM | POA: Diagnosis not present

## 2022-12-02 DIAGNOSIS — J9610 Chronic respiratory failure, unspecified whether with hypoxia or hypercapnia: Secondary | ICD-10-CM | POA: Diagnosis not present

## 2022-12-02 DIAGNOSIS — M549 Dorsalgia, unspecified: Secondary | ICD-10-CM | POA: Diagnosis not present

## 2022-12-02 DIAGNOSIS — M4856XA Collapsed vertebra, not elsewhere classified, lumbar region, initial encounter for fracture: Secondary | ICD-10-CM | POA: Diagnosis present

## 2022-12-02 DIAGNOSIS — M2550 Pain in unspecified joint: Secondary | ICD-10-CM | POA: Diagnosis not present

## 2022-12-02 DIAGNOSIS — D631 Anemia in chronic kidney disease: Secondary | ICD-10-CM | POA: Diagnosis not present

## 2022-12-02 DIAGNOSIS — Z85118 Personal history of other malignant neoplasm of bronchus and lung: Secondary | ICD-10-CM | POA: Diagnosis not present

## 2022-12-02 DIAGNOSIS — M6259 Muscle wasting and atrophy, not elsewhere classified, multiple sites: Secondary | ICD-10-CM | POA: Diagnosis not present

## 2022-12-02 DIAGNOSIS — N289 Disorder of kidney and ureter, unspecified: Secondary | ICD-10-CM | POA: Diagnosis not present

## 2022-12-02 DIAGNOSIS — Z515 Encounter for palliative care: Secondary | ICD-10-CM | POA: Diagnosis not present

## 2022-12-02 DIAGNOSIS — R2681 Unsteadiness on feet: Secondary | ICD-10-CM | POA: Diagnosis not present

## 2022-12-02 DIAGNOSIS — I2510 Atherosclerotic heart disease of native coronary artery without angina pectoris: Secondary | ICD-10-CM | POA: Diagnosis not present

## 2022-12-02 DIAGNOSIS — N281 Cyst of kidney, acquired: Secondary | ICD-10-CM | POA: Diagnosis not present

## 2022-12-02 DIAGNOSIS — Z87891 Personal history of nicotine dependence: Secondary | ICD-10-CM | POA: Diagnosis not present

## 2022-12-02 DIAGNOSIS — R070 Pain in throat: Secondary | ICD-10-CM | POA: Diagnosis not present

## 2022-12-02 DIAGNOSIS — I350 Nonrheumatic aortic (valve) stenosis: Secondary | ICD-10-CM | POA: Diagnosis present

## 2022-12-02 DIAGNOSIS — D508 Other iron deficiency anemias: Secondary | ICD-10-CM | POA: Diagnosis not present

## 2022-12-02 DIAGNOSIS — R4182 Altered mental status, unspecified: Secondary | ICD-10-CM | POA: Diagnosis not present

## 2022-12-02 DIAGNOSIS — Z7189 Other specified counseling: Secondary | ICD-10-CM | POA: Diagnosis not present

## 2022-12-02 DIAGNOSIS — I959 Hypotension, unspecified: Secondary | ICD-10-CM | POA: Diagnosis not present

## 2022-12-02 DIAGNOSIS — R197 Diarrhea, unspecified: Secondary | ICD-10-CM | POA: Diagnosis not present

## 2022-12-02 DIAGNOSIS — M545 Low back pain, unspecified: Secondary | ICD-10-CM | POA: Diagnosis not present

## 2022-12-02 DIAGNOSIS — R1030 Lower abdominal pain, unspecified: Secondary | ICD-10-CM | POA: Diagnosis not present

## 2022-12-02 DIAGNOSIS — J9600 Acute respiratory failure, unspecified whether with hypoxia or hypercapnia: Secondary | ICD-10-CM | POA: Diagnosis not present

## 2022-12-02 DIAGNOSIS — R278 Other lack of coordination: Secondary | ICD-10-CM | POA: Diagnosis not present

## 2022-12-02 DIAGNOSIS — S32010A Wedge compression fracture of first lumbar vertebra, initial encounter for closed fracture: Secondary | ICD-10-CM | POA: Diagnosis not present

## 2022-12-02 DIAGNOSIS — I10 Essential (primary) hypertension: Secondary | ICD-10-CM | POA: Diagnosis not present

## 2022-12-02 DIAGNOSIS — D649 Anemia, unspecified: Secondary | ICD-10-CM

## 2022-12-02 DIAGNOSIS — I130 Hypertensive heart and chronic kidney disease with heart failure and stage 1 through stage 4 chronic kidney disease, or unspecified chronic kidney disease: Secondary | ICD-10-CM | POA: Diagnosis not present

## 2022-12-02 DIAGNOSIS — F32A Depression, unspecified: Secondary | ICD-10-CM | POA: Diagnosis not present

## 2022-12-02 DIAGNOSIS — J9622 Acute and chronic respiratory failure with hypercapnia: Secondary | ICD-10-CM | POA: Diagnosis not present

## 2022-12-02 DIAGNOSIS — Z7401 Bed confinement status: Secondary | ICD-10-CM | POA: Diagnosis not present

## 2022-12-02 DIAGNOSIS — R1084 Generalized abdominal pain: Secondary | ICD-10-CM | POA: Diagnosis not present

## 2022-12-02 DIAGNOSIS — G9349 Other encephalopathy: Secondary | ICD-10-CM | POA: Diagnosis not present

## 2022-12-02 DIAGNOSIS — K5792 Diverticulitis of intestine, part unspecified, without perforation or abscess without bleeding: Secondary | ICD-10-CM | POA: Diagnosis not present

## 2022-12-02 DIAGNOSIS — R001 Bradycardia, unspecified: Secondary | ICD-10-CM | POA: Diagnosis not present

## 2022-12-02 DIAGNOSIS — E039 Hypothyroidism, unspecified: Secondary | ICD-10-CM | POA: Diagnosis not present

## 2022-12-02 DIAGNOSIS — E1142 Type 2 diabetes mellitus with diabetic polyneuropathy: Secondary | ICD-10-CM | POA: Diagnosis present

## 2022-12-02 DIAGNOSIS — M6281 Muscle weakness (generalized): Secondary | ICD-10-CM | POA: Diagnosis not present

## 2022-12-02 LAB — FERRITIN: Ferritin: 103 ng/mL (ref 11–307)

## 2022-12-02 LAB — BASIC METABOLIC PANEL
Anion gap: 7 (ref 5–15)
BUN: 42 mg/dL — ABNORMAL HIGH (ref 8–23)
CO2: 36 mmol/L — ABNORMAL HIGH (ref 22–32)
Calcium: 8.2 mg/dL — ABNORMAL LOW (ref 8.9–10.3)
Chloride: 98 mmol/L (ref 98–111)
Creatinine, Ser: 1.42 mg/dL — ABNORMAL HIGH (ref 0.44–1.00)
GFR, Estimated: 36 mL/min — ABNORMAL LOW (ref >60)
Glucose, Bld: 150 mg/dL — ABNORMAL HIGH (ref 70–99)
Potassium: 3.4 mmol/L — ABNORMAL LOW (ref 3.5–5.1)
Sodium: 141 mmol/L (ref 135–145)

## 2022-12-02 LAB — CBC WITH DIFFERENTIAL/PLATELET
Abs Immature Granulocytes: 0.06 10*3/uL (ref 0.00–0.07)
Basophils Absolute: 0 10*3/uL (ref 0.0–0.1)
Basophils Relative: 0 %
Eosinophils Absolute: 0.1 10*3/uL (ref 0.0–0.5)
Eosinophils Relative: 1 %
HCT: 31.8 % — ABNORMAL LOW (ref 36.0–46.0)
Hemoglobin: 9.7 g/dL — ABNORMAL LOW (ref 12.0–15.0)
Immature Granulocytes: 1 %
Lymphocytes Relative: 14 %
Lymphs Abs: 0.8 10*3/uL (ref 0.7–4.0)
MCH: 31.5 pg (ref 26.0–34.0)
MCHC: 30.5 g/dL (ref 30.0–36.0)
MCV: 103.2 fL — ABNORMAL HIGH (ref 80.0–100.0)
Monocytes Absolute: 0.4 10*3/uL (ref 0.1–1.0)
Monocytes Relative: 8 %
Neutro Abs: 4.3 10*3/uL (ref 1.7–7.7)
Neutrophils Relative %: 76 %
Platelets: 144 10*3/uL — ABNORMAL LOW (ref 150–400)
RBC: 3.08 MIL/uL — ABNORMAL LOW (ref 3.87–5.11)
RDW: 15 % (ref 11.5–15.5)
WBC: 5.7 10*3/uL (ref 4.0–10.5)
nRBC: 0 % (ref 0.0–0.2)

## 2022-12-02 LAB — GLUCOSE, CAPILLARY
Glucose-Capillary: 101 mg/dL — ABNORMAL HIGH (ref 70–99)
Glucose-Capillary: 270 mg/dL — ABNORMAL HIGH (ref 70–99)

## 2022-12-02 LAB — C-REACTIVE PROTEIN: CRP: 0.8 mg/dL (ref <1.0)

## 2022-12-02 MED ORDER — POTASSIUM CHLORIDE CRYS ER 20 MEQ PO TBCR
40.0000 meq | EXTENDED_RELEASE_TABLET | ORAL | Status: AC
  Administered 2022-12-02 (×2): 40 meq via ORAL
  Filled 2022-12-02 (×2): qty 2

## 2022-12-02 MED ORDER — PREDNISONE 10 MG PO TABS: ORAL_TABLET | ORAL | Status: AC

## 2022-12-02 NOTE — Telephone Encounter (Signed)
PT called, daughter, Keenan Bachelor, answered, as per DPR instructed them to request a cardiology consult either though their RN or PCP.

## 2022-12-02 NOTE — Discharge Summary (Signed)
Physician Discharge Summary   Patient: Alexandria [name] MRN: [id number] DOB: 1935/11/18  Admit date:     11/28/2022  Discharge date: 12/02/22  Discharge Physician: Loyce Dys   PCP: Joaquim Nam, MD   Recommendations at discharge:  Follow-up with cardiology and your primary care physician Discharge Diagnoses:  Acute on chronic respiratory failure with hypoxia and hypercapnia (HCC) Covid-19 infection (without pneumonia on imaging on admission). Acute respiratory disease due to COVID-19 virus COPD (chronic obstructive pulmonary disease) (HCC) Chronic diastolic (congestive) heart failure (HCC) Hyperlipemia Hypothyroidism Essential hypertension CAD (coronary artery disease) Paroxysmal atrial fibrillation (HCC) Stage 3b chronic kidney disease (HCC) Depression with anxiety  Obesity (BMI 30-39.9) Obstructive sleep apnea Iron deficiency anemia  Hospital Course:  Alexandria [name] is a 87 y.o. female with medical history significant of COPD requiring CPAP, HTN, HDL, DM, CAD, PVD, dCHF, hypothyroidism, depression with anxiety, CKD-3, A-fib not on anticoagulants, obesity, who presents with shortness breath. Patient was admitted for acute on chronic respiratory failure with hypoxia and hypercapnia due to Covid-19 infection.  No evidence pneumonia on imaging. She was placed on BiPAP, started on IV steroids, given one dose IV Lasix. Respiratory function improved and patient was weaned off BiPAP and currently on her usual baseline oxygen of 2 L.  She was seen by pulmonologist and currently patient has been cleared for discharge with steroid taper.  Patient will follow-up with her outpatient physicians.  Consultants: Pulmonary Procedures performed: None Disposition: Skilled nursing facility Diet recommendation:  Discharge Diet Orders (From admission, onward)     Start     Ordered   12/02/22 0000  Diet - low sodium heart healthy        12/02/22 1343           Cardiac  diet DISCHARGE MEDICATION: Allergies as of 12/02/2022       Reactions   Antihistamines, Diphenhydramine-type Other (See Comments)   Able to tolerate only allegra.    Incruse Ellipta [umeclidinium Bromide] Cough   Caused voice change and severe coughing   Clarithromycin Other (See Comments)   REACTION: diff swallowing and mouth blisters   Codeine Other (See Comments)   Unknown    Diphenhydramine    Pregabalin Other (See Comments)   Lack of effect for neuropathy pain.   Sulfonamide Derivatives    Tiotropium Bromide Monohydrate Other (See Comments)   Voice changes   Umeclidinium    Vraylar [cariprazine]    intolerant        Medication List     STOP taking these medications    magnesium 30 MG tablet       TAKE these medications    acetaminophen 325 MG tablet Commonly known as: TYLENOL Take 2 tablets (650 mg total) by mouth every 6 (six) hours as needed for mild pain, headache, fever or moderate pain.   albuterol 108 (90 Base) MCG/ACT inhaler Commonly known as: ProAir HFA Inhale 2 puffs into the lungs every 6 (six) hours as needed for wheezing or shortness of breath. INHALE 2 PUFFS INTO THE LUNGS EVERY 6 HOURS AS NEEDED FOR WHEEZING OR SHORTNESS OF BREATH   amiodarone 200 MG tablet Commonly known as: PACERONE TAKE 1 TABLET BY MOUTH EVERY DAY   arformoterol 15 MCG/2ML Nebu Commonly known as: BROVANA Take 2 mLs (15 mcg total) by nebulization at bedtime.   B-12 5000 MCG Caps Take 5,000 mcg by mouth daily.   bisoprolol 5 MG tablet Commonly known as: ZEBETA Take 0.5 tablets (2.5 mg total)  by mouth daily.   budesonide 0.5 MG/2ML nebulizer solution Commonly known as: Pulmicort Take 2 mLs (0.5 mg total) by nebulization in the morning and at bedtime.   busPIRone 15 MG tablet Commonly known as: BUSPAR Take 15 mg by mouth 3 (three) times daily.   Coenzyme Q-10 200 MG Caps Take 200 mg by mouth daily.   esomeprazole 40 MG capsule Commonly known as: NEXIUM TAKE 1  CAPSULE (40 MG TOTAL) BY MOUTH DAILY AT 12 NOON.   ezetimibe 10 MG tablet Commonly known as: ZETIA TAKE 1 TABLET BY MOUTH EVERYDAY AT BEDTIME   ferrous sulfate 325 (65 FE) MG tablet Take 1 tablet (325 mg total) by mouth daily.   FLUoxetine 20 MG tablet Commonly known as: PROZAC Take 20 mg by mouth 2 (two) times daily.   fluticasone 50 MCG/ACT nasal spray Commonly known as: FLONASE Place 2 sprays into both nostrils daily.   guaiFENesin 600 MG 12 hr tablet Commonly known as: Mucinex Take 2 tablets (1,200 mg total) by mouth 2 (two) times daily as needed for cough or to loosen phlegm.   isosorbide mononitrate 30 MG 24 hr tablet Commonly known as: IMDUR Take 30 mg by mouth daily.   levothyroxine 125 MCG tablet Commonly known as: SYNTHROID Take 1 tablet (125 mcg total) by mouth daily.   loratadine 10 MG tablet Commonly known as: CLARITIN Take 1 tablet (10 mg total) by mouth daily as needed for allergies.   nitroGLYCERIN 0.4 MG SL tablet Commonly known as: NITROSTAT Place 1 tablet (0.4 mg total) under the tongue every 5 (five) minutes as needed for chest pain.   OneTouch Delica Lancets 33G Misc Use 1x a day   OneTouch Verio test strip Generic drug: glucose blood USE AS INSTRUCTED TO CHECK SUGAR 1 TIME DAILY   OXYGEN Inhale into the lungs. 2lpm   polyethylene glycol 17 g packet Commonly known as: MIRALAX / GLYCOLAX Take 17 g by mouth daily.   potassium chloride 10 MEQ tablet Commonly known as: KLOR-CON TAKE 1 TABLET BY MOUTH EVERY DAY   predniSONE 10 MG tablet Commonly known as: DELTASONE Take 4 tablets (40 mg total) by mouth daily for 2 days, THEN 3 tablets (30 mg total) daily for 2 days, THEN 2 tablets (20 mg total) daily for 2 days, THEN 1 tablet (10 mg total) daily for 2 days. Start taking on: December 02, 2022   rosuvastatin 10 MG tablet Commonly known as: CRESTOR TAKE 1 TABLET BY MOUTH EVERY OTHER DAY   senna-docusate 8.6-50 MG tablet Commonly known as:  Senokot-S Take 1 tablet by mouth at bedtime.   Spacer/Aero Chamber [employer] Use with  inhaler as needed.  J44.9   torsemide 20 MG tablet Commonly known as: DEMADEX Take 2 tablets (40 mg total) by mouth daily. Take an additional 20 mg daily as needed for weight greater then 230 pounds   Vitamin D 50 MCG (2000 UT) tablet Take 1 tablet (2,000 Units total) by mouth daily.        Discharge Exam: Filed Weights   11/30/22 0500 12/01/22 0500 12/02/22 0500  Weight: 103.3 kg 103.4 kg 103.7 kg   General exam: awake, alert, no acute distress, obese HEENT: wearing nasal cannula, hearing grossly normal  Respiratory system: lungs clear, diminished but no audible wheezes or rhonchi, on 2 L/min Wanda o2 with normal respiratory effort at rest. Cardiovascular system: normal S1/S2, RRR, unable to visualize JVD due to body habitus, trace LE edema Gastrointestinal system: soft, NT, ND,  no HSM felt, +bowel sounds. Central nervous system: A&O x 3. no gross focal neurologic deficits, normal speech Skin: dry, intact, normal temperature Psychiatry: normal mood, congruent affect    Condition at discharge: good    Discharge time spent:  35 minutes.  Signed: Loyce Dys, MD [facility] 12/02/2022

## 2022-12-02 NOTE — Telephone Encounter (Signed)
Patient cancelled 09/26 appointment per after hours answering service due to being in the hospital. She is at [facility] and would like for someone from the cardiology team to go see her.

## 2022-12-02 NOTE — Progress Notes (Addendum)
Physical Therapy Treatment Patient Details Name: Alexandria [name] MRN: [id number] DOB: Sep 14, 1935 Today's Date: 12/02/2022   History of Present Illness Patient is a 87 year old female with acute on chronic hypoxemic respiratory failure, positive for COVID 19. History of COPD requiring CPAP, HTN, HDL, DM, CAD, PVD, CHF, hypothyroidism, depression with anxiety, CKD-3, A-fib, obesity. Recent hospital stay for hypercapnic encephalopathy with moderate COPD exacerbation with discharge to rehab.    PT Comments  Pt was pleasant and motivated to participate during the session and put forth good effort throughout. She is supervision assist for bed mobility today, and CGA for STS with RW from variable heights and surfaces. Once up, pt able to amb ~60 feet in room, with cues for slow and careful steps when taking sharp turns. Pt overall walking with steady steps, SpO2 remained within mid 90's on 2.5L throughout , no overt imbalance seen during session, pt reports no SoB or lightheadedness during session. Pt will benefit from continued PT services upon discharge to safely address deficits listed in patient problem list for decreased caregiver assistance and eventual return to PLOF.    If plan is discharge home, recommend the following: A little help with walking and/or transfers;A little help with bathing/dressing/bathroom;Assistance with cooking/housework;Help with stairs or ramp for entrance;Assist for transportation   Can travel by private vehicle     Yes  Equipment Recommendations  Other (comment) (TBD)    Recommendations for Other Services       Precautions / Restrictions Precautions Precautions: Fall Restrictions Weight Bearing Restrictions: No     Mobility  Bed Mobility                    Transfers                        Ambulation/Gait                   Stairs             Wheelchair Mobility     Tilt Bed    Modified Rankin (Stroke Patients  Only)       Balance                                            Cognition                                                Exercises      General Comments General comments (skin integrity, edema, etc.): SpO2 monitored throughout session, pt was on 2.5L throughout. SpO2 levels remained within mid 90's throughout session.      Pertinent Vitals/Pain      Home Living                          Prior Function            PT Goals (current goals can now be found in the care plan section) Progress towards PT goals: Progressing toward goals    Frequency    Min 1X/week      PT Plan      Co-evaluation  AM-PAC PT "6 Clicks" Mobility   Outcome Measure  Help needed turning from your back to your side while in a flat bed without using bedrails?: A Little Help needed moving from lying on your back to sitting on the side of a flat bed without using bedrails?: A Little Help needed moving to and from a bed to a chair (including a wheelchair)?: A Little Help needed standing up from a chair using your arms (e.g., wheelchair or bedside chair)?: A Little Help needed to walk in hospital room?: A Little Help needed climbing 3-5 steps with a railing? : A Lot 6 Click Score: 17    End of Session Equipment Utilized During Treatment: Oxygen Activity Tolerance: Patient tolerated treatment well;Patient limited by fatigue Patient left: in chair;with chair alarm set;with call bell/phone within reach Nurse Communication: Mobility status PT Visit Diagnosis: Unsteadiness on feet (R26.81);Muscle weakness (generalized) (M62.81)     Time: 1610-9604 PT Time Calculation (min) (ACUTE ONLY): 22 min  Charges:    $Gait Training: 8-22 mins PT General Charges $$ ACUTE PT VISIT: 1 Visit                     Cecile Sheerer, SPT 12/02/22, 12:41 PM

## 2022-12-02 NOTE — Progress Notes (Signed)
Pt and sitter stated that pt was NOT to wear BIPAP tonight. BIPAP remains at bedside.

## 2022-12-02 NOTE — Progress Notes (Signed)
PULMONOLOGY         Date: 12/02/2022,   MRN# [id number] Alexandria [name] 1935-03-17     AdmissionWeight: 107.5 kg                 CurrentWeight: 103.7 kg  Referring provider: Dr Denton Lank   CHIEF COMPLAINT:   Acute on chronic hypoxemic respiratory failure   HISTORY OF PRESENT ILLNESS   Alexandria [name] is a 87 y.o. female with medical history significant of COPD requiring CPAP, HTN, HDL, DM, CAD, PVD, dCHF, hypothyroidism, depression with anxiety, CKD-3, A-fib not on anticoagulants, obesity, who presents with shortness breath. She was recently hospitalized for hypercapnic encephalopathy with moderate COPD exacerbation and improved with new CPAP prescription.   Patient also had worsening anemia and required 2 unit of blood transfusion and IV iron infusion. Pt was discharged to rehab facility.  Patient was diagnosed with COVID19 infection during this admission.  Her family requests atypical therapy including hydroxychloroquin and ivermectin.   11/30/22- patient is off BIPAP she is lucid and on nasal canula.  She is eating and not feeling dyspneic.   12/01/22- patient is stable on 2l/min [place].  She had PT and did well.  I spoke with Alexandria [name] her daughter and she is ready for dc to rehab. BMP with mild hypokalemia.   12/02/22- patient is cleared for dc to rehab.  Dr Criss Alvine has discussed medical plan and follow up with patient and daughter , she has bed ready and approved for rehab. She's at baseline and in good spirits cleared for dc.   PAST MEDICAL HISTORY   Past Medical History:  Diagnosis Date   Allergy, unspecified not elsewhere classified    Anxiety    Aortic stenosis, mild 03/30/2022   a. TTE 03/29/2022: EF 55-60, no RWMA, GR 1 DD, normal RVSF, mild aortic stenosis (mean 10, V-max 212 cm/s, DI 0.70)   Cataract    Chronic diastolic congestive heart failure (HCC)    a. 06/2022 Echo: EF 60-65%, no rwma, nl RV fxn, mild MR, mild AS.   COPD (chronic obstructive pulmonary disease)  (HCC) 03/08/1998   PFTs 12/12/2002 FEV 1 1.42 (64%) ratio 58 with no better after B2 and DLCO75%; PFTs 11/20/09 FEV1 1.50 (73%) ratio 50 no better after B2 with DLCO 62%; Hfa 50% 11/20/2009 >75%, 01/13/10 p coaching   Coronary artery calcification seen on CT scan    a. 2012 Neg stress test.   Depression 12/07/1998   Diabetes mellitus type II 02/05/2006   Dr. Elvera Lennox with endo   Disorders of bursae and tendons in shoulder region, unspecified    Rotator cuff syndrome, right   E. coli bacteremia    Esophagitis    GERD (gastroesophageal reflux disease)    History of UTI    Hyperlipidemia 10/04/2000   Hypertension 03/08/1992   Hypothyroidism 03/08/1968   Iron deficiency anemia    Lung nodule    radiation starts 10-08-2019   Malignant neoplasm of right upper lobe of lung (HCC) 07/24/2019   Obesity    NOS   OSA (obstructive sleep apnea)    PSG 01/27/10 AHI 13, pt does not know CPAP settings   PAF (paroxysmal atrial fibrillation) (HCC)    a. 06/2022 in setting of UTI/encephalopathy-->converted on amio; b. CHA2DS2VASc = 6-->No OAC 2/2 chronic anemia/fall risk; c. 06/2022 Zio: Predominantly sinus bradycardia at 54 (3-158)'s.  1 brief run of SVT.  2.2% PVC burden.   Peripheral neuropathy    Likely due to  DM per Dr. Tresa Endo hernia      SURGICAL HISTORY   Past Surgical History:  Procedure Laterality Date   ABD U/S  03/19/1999   Nml x2 foci in liver   ADENOSINE MYOVIEW  06/02/2007   Nml   CARDIOLITE PERSANTINE  08/24/2000   Nml   CAROTID U/S  08/24/2000   1-39% ICA stenosis   CAROTID U/S  06/02/2007   No apprec change    CARPAL TUNNEL RELEASE  12/1997   Right   CESAREAN SECTION     x2 Bree[place]/ repeat   CHOLECYSTECTOMY  1997   COLONOSCOPY WITH PROPOFOL N/A 02/12/2016   Procedure: COLONOSCOPY WITH PROPOFOL;  Surgeon: Rachael Fee, MD;  Location: [facility];  Service: Endoscopy;  Laterality: N/A;   CT ABD W & PELVIS WO/W CM     Abd hemangiomas of liver, 1 cm R renal cyst    DENTAL SURGERY  2016   Implants   DEXA  07/03/2003   Nml   ESOPHAGOGASTRODUODENOSCOPY  12/05/1997   Nml (due to hoarseness)   ESOPHAGOGASTRODUODENOSCOPY (EGD) WITH PROPOFOL N/A 02/12/2016   Procedure: ESOPHAGOGASTRODUODENOSCOPY (EGD) WITH PROPOFOL;  Surgeon: Rachael Fee, MD;  Location: [facility];  Service: Endoscopy;  Laterality: N/A;   GALLBLADDER SURGERY     HERNIA REPAIR  01/24/2009   Lap Ventr w/ Lysis of adhesions (Dr. Dwain Sarna)   knee arthroscopic surgery  years ago   right   ROTATOR CUFF REPAIR  1984   Right, [place]   SHOULDER OPEN ROTATOR CUFF REPAIR  02/08/2012   Procedure: ROTATOR CUFF REPAIR SHOULDER OPEN;  Surgeon: Drucilla Schmidt, MD;  Location: [facility];  Service: Orthopedics;  Laterality: Left;  Left Shoulder Open Anterior Acrominectomy Rotator Cuff Repair Open Distal Clavicle Resection ,tissue mend graft, and repair of biceps tendon   THUMB RELEASE  12/1997   Right   TONSILLECTOMY     TOTAL ABDOMINAL HYSTERECTOMY  1985   Due to dysmennorhea   US ECHOCARDIOGRAPHY  06/02/2007     FAMILY HISTORY   Family History  Problem Relation Age of Onset   Heart disease Mother    Thyroid disease Mother    Emphysema Father        One lung   Cystic fibrosis Sister    Hyperthyroidism Sister    Osteoarthritis Brother    Hyperthyroidism Brother    Esophageal cancer Neg Hx    Cancer Neg Hx        Head or neck   Colon cancer Neg Hx    Stomach cancer Neg Hx    Breast cancer Neg Hx      SOCIAL HISTORY   Social History   Tobacco Use   Smoking status: Former    Current packs/day: 0.00    Average packs/day: 2.0 packs/day for 50.0 years (100.0 ttl pk-yrs)    Types: Cigarettes    Start date: 02/06/1955    Quit date: 02/05/2005    Years since quitting: 17.8   Smokeless tobacco: Never  Vaping Use   Vaping status: Never Used  Substance Use Topics   Alcohol use: Yes    Comment: occasionally   Drug use: Never     MEDICATIONS    Home Medication:      Current Medication:  Current Facility-Administered Medications:    acetaminophen (TYLENOL) tablet 650 mg, 650 mg, Oral, Q6H PRN, Lorretta Harp, MD, 650 mg at 12/01/22 2158   albuterol (VENTOLIN HFA) 108 (90 Base) MCG/ACT inhaler 2 puff, 2  puff, Inhalation, Q4H PRN, Pennie Banter, DO, 2 puff at 12/01/22 1656   amiodarone (PACERONE) tablet 200 mg, 200 mg, Oral, Daily, Lorretta Harp, MD, 200 mg at 12/02/22 5784   ascorbic acid (VITAMIN C) tablet 500 mg, 500 mg, Oral, Daily, Karna Christmas, Tysheem Accardo, MD, 500 mg at 12/02/22 0826   bisoprolol (ZEBETA) tablet 2.5 mg, 2.5 mg, Oral, Daily, Lorretta Harp, MD, 2.5 mg at 12/02/22 0827   busPIRone (BUSPAR) tablet 15 mg, 15 mg, Oral, TID, Lorretta Harp, MD, 15 mg at 12/02/22 0825   cholecalciferol (VITAMIN D3) 25 MCG (1000 UNIT) tablet 2,000 Units, 2,000 Units, Oral, Daily, Lorretta Harp, MD, 2,000 Units at 12/02/22 6962   cyanocobalamin (VITAMIN B12) tablet 2,000 mcg, 2,000 mcg, Oral, Daily, Lorretta Harp, MD, 2,000 mcg at 12/02/22 9528   dextromethorphan-guaiFENesin (MUCINEX DM) 30-600 MG per 12 hr tablet 1 tablet, 1 tablet, Oral, BID PRN, Lorretta Harp, MD   enoxaparin (LOVENOX) injection 55 mg, 0.5 mg/kg, Subcutaneous, Q24H, Hunt, Madison H, RPH, 55 mg at 12/01/22 2154   ezetimibe (ZETIA) tablet 10 mg, 10 mg, Oral, QHS, Lorretta Harp, MD, 10 mg at 12/01/22 2153   ferrous sulfate tablet 325 mg, 325 mg, Oral, Daily, Lorretta Harp, MD, 325 mg at 12/02/22 4132   FLUoxetine (PROZAC) capsule 20 mg, 20 mg, Oral, BID, Lorretta Harp, MD, 20 mg at 12/02/22 4401   hydrALAZINE (APRESOLINE) injection 5 mg, 5 mg, Intravenous, Q2H PRN, Lorretta Harp, MD   insulin aspart (novoLOG) injection 0-20 Units, 0-20 Units, Subcutaneous, TID AC & HS, Mansy, Vernetta Honey, MD, 4 Units at 12/01/22 2156   Ipratropium-Albuterol (COMBIVENT) respimat 1 puff, 1 puff, Inhalation, QID, Esaw Grandchild A, DO, 1 puff at 12/02/22 0831   isosorbide mononitrate (IMDUR) 24 hr tablet 30 mg, 30 mg, Oral, Daily, Lorretta Harp, MD, 30 mg at  12/02/22 0272   levothyroxine (SYNTHROID) tablet 125 mcg, 125 mcg, Oral, Q0600, Lorretta Harp, MD, 125 mcg at 12/02/22 0706   loratadine (CLARITIN) tablet 10 mg, 10 mg, Oral, Daily PRN, Lorretta Harp, MD   mometasone-formoterol [facility]) 200-5 MCG/ACT inhaler 2 puff, 2 puff, Inhalation, BID, Esaw Grandchild A, DO, 2 puff at 12/02/22 0830   nitroGLYCERIN (NITROSTAT) SL tablet 0.4 mg, 0.4 mg, Sublingual, Q5 min PRN, Lorretta Harp, MD   ondansetron [facility]) injection 4 mg, 4 mg, Intravenous, Q8H PRN, Lorretta Harp, MD, 4 mg at 11/28/22 2341   pantoprazole (PROTONIX) EC tablet 40 mg, 40 mg, Oral, Daily, Lorretta Harp, MD, 40 mg at 12/02/22 0825   polyethylene glycol (MIRALAX / GLYCOLAX) packet 17 g, 17 g, Oral, Daily, Lorretta Harp, MD, 17 g at 12/02/22 0827   potassium chloride SA (KLOR-CON M) CR tablet 40 mEq, 40 mEq, Oral, Q4H, Djan, Prince T, MD, 40 mEq at 12/02/22 1010   predniSONE (DELTASONE) tablet 45 mg, 45 mg, Oral, Q breakfast, Karna Christmas, Marcellius Montagna, MD, 45 mg at 12/02/22 0837   rosuvastatin (CRESTOR) tablet 10 mg, 10 mg, Oral, Raeanne Gathers, MD, 10 mg at 12/02/22 5366   senna-docusate (Senokot-S) tablet 1 tablet, 1 tablet, Oral, QHS, Lorretta Harp, MD, 1 tablet at 12/01/22 2153   torsemide (DEMADEX) tablet 40 mg, 40 mg, Oral, Daily, Lorretta Harp, MD, 40 mg at 12/02/22 4403   zinc sulfate capsule 220 mg, 220 mg, Oral, Daily, Vida Rigger, MD, 220 mg at 12/02/22 4742  Facility-Administered Medications Ordered in Other Encounters:    0.9 %  sodium chloride infusion, , Intravenous, Once, Louretta Shorten R, MD   0.9 %  sodium chloride  infusion, , Intravenous, Continuous, Earna Coder, MD, Last Rate: 20 mL/hr at 11/02/22 1421, New Bag at 11/02/22 1421    ALLERGIES   Antihistamines, diphenhydramine-type; Incruse ellipta [umeclidinium bromide]; Clarithromycin; Codeine; Diphenhydramine; Pregabalin; Sulfonamide derivatives; Tiotropium bromide monohydrate; Umeclidinium; and Vraylar  [cariprazine]     REVIEW OF SYSTEMS    Review of Systems:  Gen:  Denies  fever, sweats, chills weigh loss  HEENT: Denies blurred vision, double vision, ear pain, eye pain, hearing loss, nose bleeds, sore throat Cardiac:  No dizziness, chest pain or heaviness, chest tightness,edema Resp:   reports dyspnea chronically  Gi: Denies swallowing difficulty, stomach pain, nausea or vomiting, diarrhea, constipation, bowel incontinence Gu:  Denies bladder incontinence, burning urine Ext:   Denies Joint pain, stiffness or swelling Skin: Denies  skin rash, easy bruising or bleeding or hives Endoc:  Denies polyuria, polydipsia , polyphagia or weight change Psych:   Denies depression, insomnia or hallucinations   Other:  All other systems negative   VS: BP (!) 111/51 (BP Location: Right Arm)   Pulse (!) 56   Temp (!) 97.5 F (36.4 C)   Resp 18   Ht 5\' 5"  (1.651 m)   Wt 103.7 kg   LMP  (LMP Unknown)   SpO2 97%   BMI 38.04 kg/m      PHYSICAL EXAM    GENERAL:NAD, no fevers, chills, no weakness no fatigue HEAD: Normocephalic, atraumatic.  EYES: Pupils equal, round, reactive to light. Extraocular muscles intact. No scleral icterus.  MOUTH: Moist mucosal membrane. Dentition intact. No abscess noted.  EAR, NOSE, THROAT: Clear without exudates. No external lesions.  NECK: Supple. No thyromegaly. No nodules. No JVD.  PULMONARY: decreased breath sounds with mild rhonchi worse at bases bilaterally.  CARDIOVASCULAR: S1 and S2. Regular rate and rhythm. No murmurs, rubs, or gallops. No edema. Pedal pulses 2+ bilaterally.  GASTROINTESTINAL: Soft, nontender, nondistended. No masses. Positive bowel sounds. No hepatosplenomegaly.  MUSCULOSKELETAL: No swelling, clubbing, or edema. Range of motion full in all extremities.  NEUROLOGIC: Cranial nerves II through XII are intact. No gross focal neurological deficits. Sensation intact. Reflexes intact.  SKIN: No ulceration, lesions, rashes, or cyanosis.  Skin warm and dry. Turgor intact.  PSYCHIATRIC: Mood, affect within normal limits. The patient is awake, alert and oriented x 3. Insight, judgment intact.       IMAGING   @IMAGES @   ASSESSMENT/PLAN    Acute COVID19 infection -Remdesevir antiviral - not indicated -vitamin C -zinc -IV steroids to prednisone with taper  -Diuresis - torsemice 20 IV - monitor UOP - utilize external urinary catheter if possible -encourage to use IS and Acapella device for bronchopulmonary hygiene when able -PT/OT when possible -procalcitonin, CRP and ferritin trending-mildly elevated   Advanced copd with chronic hypoxemia  Agree with steroids and nebulizer therapy per COPD care path            Thank you for allowing me to participate in the care of this patient.   Patient/Family are satisfied with care plan and all questions have been answered.    Provider disclosure: Patient with at least one acute or chronic illness or injury that poses a threat to life or bodily function and is being managed actively during this encounter.  All of the below services have been performed independently by signing provider:  review of prior documentation from internal and or external health records.  Review of previous and current lab results.  Interview and comprehensive assessment during patient visit today. Review of  current and previous chest radiographs/CT scans. Discussion of management and test interpretation with health care team and patient/family.   This document was prepared using Dragon voice recognition software and may include unintentional dictation errors.     Vida Rigger, M.D.  Division of Pulmonary & Critical Care Medicine

## 2022-12-02 NOTE — Progress Notes (Signed)
Olene Craven to be D/C'd to [facility] per MD order. Attempted report to [facility] x3. Call back number provided to secretary at [facility]. Skin clean and dry with generalized bruising and redness.  IV catheter discontinued intact. Site without signs and symptoms of complications. Dressing and pressure applied.  An After Visit Summary was printed and given to EMS.  Patient escorted via stretcher, and D/C via EMS.  Jon Gills  12/02/2022 3:46 PM

## 2022-12-02 NOTE — TOC Transition Note (Signed)
Transition of Care [facility]) - CM/SW Discharge Note   Patient Details  Name: Alexandria [name] MRN: [id number] Date of Birth: [birth year]  [facility] of [facility] CM/SW Contact:  Hetty Ely, RN Phone Number: 12/02/2022, 2:04 PM   Clinical Narrative:  Patient to return to [facility] [phone], RN to call report to [phone]. TOC needs resolved.       Barriers to Discharge: Barriers Resolved   Patient Goals and CMS Choice      Discharge Placement                Patient chooses bed at: [facility] Patient to be transferred to facility by: [facility] Name of family member notified: MD spoke with Daughter Patient and family notified of of transfer: 12/02/22  Discharge Plan and Services Additional resources added to the After Visit Summary for                  DME Arranged: N/A DME Agency: NA       HH Arranged: NA HH Agency: NA        Social Determinants of Health (SDOH) Interventions SDOH Screenings   Food Insecurity: No Food Insecurity (11/17/2022)  Housing: Low Risk  (11/17/2022)  Transportation Needs: No Transportation Needs (11/17/2022)  Utilities: Not At Risk (11/17/2022)  Alcohol Screen: Low Risk  (10/21/2022)  Depression (PHQ2-9): Low Risk  (10/21/2022)  Recent Concern: Depression (PHQ2-9) - Medium Risk (08/03/2022)  Financial Resource Strain: Low Risk  (10/21/2022)  Physical Activity: Insufficiently Active (10/21/2022)  Social Connections: Socially Isolated (10/21/2022)  Stress: No Stress Concern Present (10/21/2022)  Tobacco Use: Medium Risk (11/28/2022)  Health Literacy: Adequate Health Literacy (10/21/2022)     Readmission Risk Interventions    11/18/2022    4:47 PM  Readmission Risk Prevention Plan  Transportation Screening Complete  Medication Review (RN Care Manager) Complete  HRI or Home Care Consult Complete  SW Recovery Care/Counseling Consult Complete  Palliative Care Screening Not Applicable  Skilled Nursing Facility Complete

## 2022-12-03 DIAGNOSIS — M6281 Muscle weakness (generalized): Secondary | ICD-10-CM | POA: Diagnosis not present

## 2022-12-03 DIAGNOSIS — J9600 Acute respiratory failure, unspecified whether with hypoxia or hypercapnia: Secondary | ICD-10-CM | POA: Diagnosis not present

## 2022-12-03 DIAGNOSIS — Z8616 Personal history of COVID-19: Secondary | ICD-10-CM | POA: Diagnosis not present

## 2022-12-03 DIAGNOSIS — I10 Essential (primary) hypertension: Secondary | ICD-10-CM | POA: Diagnosis not present

## 2022-12-03 DIAGNOSIS — E1122 Type 2 diabetes mellitus with diabetic chronic kidney disease: Secondary | ICD-10-CM | POA: Diagnosis not present

## 2022-12-03 DIAGNOSIS — J441 Chronic obstructive pulmonary disease with (acute) exacerbation: Secondary | ICD-10-CM | POA: Diagnosis not present

## 2022-12-03 DIAGNOSIS — I5032 Chronic diastolic (congestive) heart failure: Secondary | ICD-10-CM | POA: Diagnosis not present

## 2022-12-03 DIAGNOSIS — I480 Paroxysmal atrial fibrillation: Secondary | ICD-10-CM | POA: Diagnosis not present

## 2022-12-03 DIAGNOSIS — D508 Other iron deficiency anemias: Secondary | ICD-10-CM | POA: Diagnosis not present

## 2022-12-03 DIAGNOSIS — I130 Hypertensive heart and chronic kidney disease with heart failure and stage 1 through stage 4 chronic kidney disease, or unspecified chronic kidney disease: Secondary | ICD-10-CM | POA: Diagnosis not present

## 2022-12-03 DIAGNOSIS — N1832 Chronic kidney disease, stage 3b: Secondary | ICD-10-CM | POA: Diagnosis not present

## 2022-12-06 ENCOUNTER — Encounter: Payer: Self-pay | Admitting: Cardiovascular Disease

## 2022-12-06 DIAGNOSIS — J9600 Acute respiratory failure, unspecified whether with hypoxia or hypercapnia: Secondary | ICD-10-CM | POA: Diagnosis not present

## 2022-12-06 DIAGNOSIS — J441 Chronic obstructive pulmonary disease with (acute) exacerbation: Secondary | ICD-10-CM | POA: Diagnosis not present

## 2022-12-06 DIAGNOSIS — N1832 Chronic kidney disease, stage 3b: Secondary | ICD-10-CM | POA: Diagnosis not present

## 2022-12-06 DIAGNOSIS — I480 Paroxysmal atrial fibrillation: Secondary | ICD-10-CM | POA: Diagnosis not present

## 2022-12-06 DIAGNOSIS — I10 Essential (primary) hypertension: Secondary | ICD-10-CM | POA: Diagnosis not present

## 2022-12-06 DIAGNOSIS — Z8616 Personal history of COVID-19: Secondary | ICD-10-CM | POA: Diagnosis not present

## 2022-12-06 DIAGNOSIS — E1122 Type 2 diabetes mellitus with diabetic chronic kidney disease: Secondary | ICD-10-CM | POA: Diagnosis not present

## 2022-12-06 DIAGNOSIS — D508 Other iron deficiency anemias: Secondary | ICD-10-CM | POA: Diagnosis not present

## 2022-12-06 DIAGNOSIS — I130 Hypertensive heart and chronic kidney disease with heart failure and stage 1 through stage 4 chronic kidney disease, or unspecified chronic kidney disease: Secondary | ICD-10-CM | POA: Diagnosis not present

## 2022-12-06 DIAGNOSIS — M6281 Muscle weakness (generalized): Secondary | ICD-10-CM | POA: Diagnosis not present

## 2022-12-06 DIAGNOSIS — R1030 Lower abdominal pain, unspecified: Secondary | ICD-10-CM | POA: Diagnosis not present

## 2022-12-06 DIAGNOSIS — I5032 Chronic diastolic (congestive) heart failure: Secondary | ICD-10-CM | POA: Diagnosis not present

## 2022-12-07 ENCOUNTER — Ambulatory Visit: Payer: Medicare Other | Admitting: Family Medicine

## 2022-12-07 DIAGNOSIS — J9600 Acute respiratory failure, unspecified whether with hypoxia or hypercapnia: Secondary | ICD-10-CM | POA: Diagnosis not present

## 2022-12-07 DIAGNOSIS — I5032 Chronic diastolic (congestive) heart failure: Secondary | ICD-10-CM | POA: Diagnosis not present

## 2022-12-07 DIAGNOSIS — J449 Chronic obstructive pulmonary disease, unspecified: Secondary | ICD-10-CM | POA: Diagnosis not present

## 2022-12-07 DIAGNOSIS — I10 Essential (primary) hypertension: Secondary | ICD-10-CM | POA: Diagnosis not present

## 2022-12-07 DIAGNOSIS — M6281 Muscle weakness (generalized): Secondary | ICD-10-CM | POA: Diagnosis not present

## 2022-12-07 DIAGNOSIS — N1832 Chronic kidney disease, stage 3b: Secondary | ICD-10-CM | POA: Diagnosis not present

## 2022-12-07 DIAGNOSIS — R278 Other lack of coordination: Secondary | ICD-10-CM | POA: Diagnosis not present

## 2022-12-07 DIAGNOSIS — F331 Major depressive disorder, recurrent, moderate: Secondary | ICD-10-CM | POA: Diagnosis not present

## 2022-12-07 DIAGNOSIS — I480 Paroxysmal atrial fibrillation: Secondary | ICD-10-CM | POA: Diagnosis not present

## 2022-12-07 DIAGNOSIS — J441 Chronic obstructive pulmonary disease with (acute) exacerbation: Secondary | ICD-10-CM | POA: Diagnosis not present

## 2022-12-07 DIAGNOSIS — E1122 Type 2 diabetes mellitus with diabetic chronic kidney disease: Secondary | ICD-10-CM | POA: Diagnosis not present

## 2022-12-07 DIAGNOSIS — D508 Other iron deficiency anemias: Secondary | ICD-10-CM | POA: Diagnosis not present

## 2022-12-07 DIAGNOSIS — J9611 Chronic respiratory failure with hypoxia: Secondary | ICD-10-CM | POA: Diagnosis not present

## 2022-12-07 DIAGNOSIS — R2681 Unsteadiness on feet: Secondary | ICD-10-CM | POA: Diagnosis not present

## 2022-12-07 DIAGNOSIS — Z8616 Personal history of COVID-19: Secondary | ICD-10-CM | POA: Diagnosis not present

## 2022-12-08 DIAGNOSIS — D508 Other iron deficiency anemias: Secondary | ICD-10-CM | POA: Diagnosis not present

## 2022-12-08 DIAGNOSIS — Z8616 Personal history of COVID-19: Secondary | ICD-10-CM | POA: Diagnosis not present

## 2022-12-08 DIAGNOSIS — N1832 Chronic kidney disease, stage 3b: Secondary | ICD-10-CM | POA: Diagnosis not present

## 2022-12-08 DIAGNOSIS — J9600 Acute respiratory failure, unspecified whether with hypoxia or hypercapnia: Secondary | ICD-10-CM | POA: Diagnosis not present

## 2022-12-08 DIAGNOSIS — M6281 Muscle weakness (generalized): Secondary | ICD-10-CM | POA: Diagnosis not present

## 2022-12-08 DIAGNOSIS — I5032 Chronic diastolic (congestive) heart failure: Secondary | ICD-10-CM | POA: Diagnosis not present

## 2022-12-08 DIAGNOSIS — I480 Paroxysmal atrial fibrillation: Secondary | ICD-10-CM | POA: Diagnosis not present

## 2022-12-08 DIAGNOSIS — I10 Essential (primary) hypertension: Secondary | ICD-10-CM | POA: Diagnosis not present

## 2022-12-08 DIAGNOSIS — R1030 Lower abdominal pain, unspecified: Secondary | ICD-10-CM | POA: Diagnosis not present

## 2022-12-08 DIAGNOSIS — I130 Hypertensive heart and chronic kidney disease with heart failure and stage 1 through stage 4 chronic kidney disease, or unspecified chronic kidney disease: Secondary | ICD-10-CM | POA: Diagnosis not present

## 2022-12-08 DIAGNOSIS — E1122 Type 2 diabetes mellitus with diabetic chronic kidney disease: Secondary | ICD-10-CM | POA: Diagnosis not present

## 2022-12-08 DIAGNOSIS — J441 Chronic obstructive pulmonary disease with (acute) exacerbation: Secondary | ICD-10-CM | POA: Diagnosis not present

## 2022-12-08 NOTE — Telephone Encounter (Signed)
Appt has been scheduled with Dr. Judeth Horn on 10/9.  Nothing further needed.

## 2022-12-09 ENCOUNTER — Inpatient Hospital Stay (HOSPITAL_BASED_OUTPATIENT_CLINIC_OR_DEPARTMENT_OTHER): Payer: Medicare Other | Admitting: Nurse Practitioner

## 2022-12-09 ENCOUNTER — Inpatient Hospital Stay: Payer: Medicare Other | Attending: Internal Medicine

## 2022-12-09 ENCOUNTER — Inpatient Hospital Stay: Payer: Medicare Other

## 2022-12-09 VITALS — BP 118/49 | HR 57

## 2022-12-09 VITALS — BP 139/64 | HR 57 | Temp 97.6°F | Resp 24 | Ht 65.0 in | Wt 217.0 lb

## 2022-12-09 DIAGNOSIS — Z85118 Personal history of other malignant neoplasm of bronchus and lung: Secondary | ICD-10-CM | POA: Insufficient documentation

## 2022-12-09 DIAGNOSIS — E538 Deficiency of other specified B group vitamins: Secondary | ICD-10-CM | POA: Diagnosis not present

## 2022-12-09 DIAGNOSIS — J9610 Chronic respiratory failure, unspecified whether with hypoxia or hypercapnia: Secondary | ICD-10-CM | POA: Insufficient documentation

## 2022-12-09 DIAGNOSIS — J449 Chronic obstructive pulmonary disease, unspecified: Secondary | ICD-10-CM | POA: Diagnosis not present

## 2022-12-09 DIAGNOSIS — R531 Weakness: Secondary | ICD-10-CM | POA: Insufficient documentation

## 2022-12-09 DIAGNOSIS — D509 Iron deficiency anemia, unspecified: Secondary | ICD-10-CM | POA: Insufficient documentation

## 2022-12-09 DIAGNOSIS — M549 Dorsalgia, unspecified: Secondary | ICD-10-CM | POA: Diagnosis not present

## 2022-12-09 DIAGNOSIS — M2550 Pain in unspecified joint: Secondary | ICD-10-CM | POA: Diagnosis not present

## 2022-12-09 DIAGNOSIS — D631 Anemia in chronic kidney disease: Secondary | ICD-10-CM | POA: Insufficient documentation

## 2022-12-09 DIAGNOSIS — D62 Acute posthemorrhagic anemia: Secondary | ICD-10-CM

## 2022-12-09 DIAGNOSIS — N1832 Chronic kidney disease, stage 3b: Secondary | ICD-10-CM

## 2022-12-09 DIAGNOSIS — R5383 Other fatigue: Secondary | ICD-10-CM | POA: Diagnosis not present

## 2022-12-09 DIAGNOSIS — Z87891 Personal history of nicotine dependence: Secondary | ICD-10-CM | POA: Diagnosis not present

## 2022-12-09 DIAGNOSIS — E611 Iron deficiency: Secondary | ICD-10-CM | POA: Insufficient documentation

## 2022-12-09 DIAGNOSIS — D649 Anemia, unspecified: Secondary | ICD-10-CM

## 2022-12-09 DIAGNOSIS — I130 Hypertensive heart and chronic kidney disease with heart failure and stage 1 through stage 4 chronic kidney disease, or unspecified chronic kidney disease: Secondary | ICD-10-CM | POA: Insufficient documentation

## 2022-12-09 DIAGNOSIS — D696 Thrombocytopenia, unspecified: Secondary | ICD-10-CM | POA: Insufficient documentation

## 2022-12-09 DIAGNOSIS — D500 Iron deficiency anemia secondary to blood loss (chronic): Secondary | ICD-10-CM | POA: Diagnosis not present

## 2022-12-09 DIAGNOSIS — I350 Nonrheumatic aortic (valve) stenosis: Secondary | ICD-10-CM | POA: Diagnosis not present

## 2022-12-09 LAB — CBC WITH DIFFERENTIAL (CANCER CENTER ONLY)
Abs Immature Granulocytes: 0.07 10*3/uL (ref 0.00–0.07)
Basophils Absolute: 0 10*3/uL (ref 0.0–0.1)
Basophils Relative: 0 %
Eosinophils Absolute: 0 10*3/uL (ref 0.0–0.5)
Eosinophils Relative: 0 %
HCT: 31.6 % — ABNORMAL LOW (ref 36.0–46.0)
Hemoglobin: 9.7 g/dL — ABNORMAL LOW (ref 12.0–15.0)
Immature Granulocytes: 1 %
Lymphocytes Relative: 5 %
Lymphs Abs: 0.5 10*3/uL — ABNORMAL LOW (ref 0.7–4.0)
MCH: 31.6 pg (ref 26.0–34.0)
MCHC: 30.7 g/dL (ref 30.0–36.0)
MCV: 102.9 fL — ABNORMAL HIGH (ref 80.0–100.0)
Monocytes Absolute: 0.6 10*3/uL (ref 0.1–1.0)
Monocytes Relative: 6 %
Neutro Abs: 8.3 10*3/uL — ABNORMAL HIGH (ref 1.7–7.7)
Neutrophils Relative %: 88 %
Platelet Count: 128 10*3/uL — ABNORMAL LOW (ref 150–400)
RBC: 3.07 MIL/uL — ABNORMAL LOW (ref 3.87–5.11)
RDW: 14.8 % (ref 11.5–15.5)
WBC Count: 9.5 10*3/uL (ref 4.0–10.5)
nRBC: 0 % (ref 0.0–0.2)

## 2022-12-09 LAB — SAMPLE TO BLOOD BANK

## 2022-12-09 LAB — BASIC METABOLIC PANEL - CANCER CENTER ONLY
Anion gap: 8 (ref 5–15)
BUN: 26 mg/dL — ABNORMAL HIGH (ref 8–23)
CO2: 35 mmol/L — ABNORMAL HIGH (ref 22–32)
Calcium: 8.2 mg/dL — ABNORMAL LOW (ref 8.9–10.3)
Chloride: 92 mmol/L — ABNORMAL LOW (ref 98–111)
Creatinine: 1.43 mg/dL — ABNORMAL HIGH (ref 0.44–1.00)
GFR, Estimated: 36 mL/min — ABNORMAL LOW (ref >60)
Glucose, Bld: 188 mg/dL — ABNORMAL HIGH (ref 70–99)
Potassium: 3.4 mmol/L — ABNORMAL LOW (ref 3.5–5.1)
Sodium: 135 mmol/L (ref 135–145)

## 2022-12-09 MED ORDER — SODIUM CHLORIDE 0.9 % IV SOLN
200.0000 mg | Freq: Once | INTRAVENOUS | Status: AC
  Administered 2022-12-09: 200 mg via INTRAVENOUS
  Filled 2022-12-09: qty 200

## 2022-12-09 MED ORDER — SODIUM CHLORIDE 0.9 % IV SOLN
Freq: Once | INTRAVENOUS | Status: AC
  Filled 2022-12-09: qty 250

## 2022-12-09 MED ORDER — METHYLPREDNISOLONE SODIUM SUCC 40 MG IJ SOLR
20.0000 mg | Freq: Once | INTRAMUSCULAR | Status: AC
  Administered 2022-12-09: 20 mg via INTRAVENOUS
  Filled 2022-12-09: qty 1

## 2022-12-09 NOTE — Patient Instructions (Signed)
Iron Sucrose Injection What is this medication? IRON SUCROSE (EYE ern SOO krose) treats low levels of iron (iron deficiency anemia) in people with kidney disease. Iron is a mineral that plays an important role in making red blood cells, which carry oxygen from your lungs to the rest of your body. This medicine may be used for other purposes; ask your health care provider or pharmacist if you have questions. COMMON BRAND NAME(S): Venofer What should I tell my care team before I take this medication? They need to know if you have any of these conditions: Anemia not caused by low iron levels Heart disease High levels of iron in the blood Kidney disease Liver disease An unusual or allergic reaction to iron, other medications, foods, dyes, or preservatives Pregnant or trying to get pregnant Breastfeeding How should I use this medication? This medication is for infusion into a vein. It is given in a hospital or clinic setting. Talk to your care team about the use of this medication in children. While this medication may be prescribed for children as young as 2 years for selected conditions, precautions do apply. Overdosage: If you think you have taken too much of this medicine contact a poison control center or emergency room at once. NOTE: This medicine is only for you. Do not share this medicine with others. What if I miss a dose? Keep appointments for follow-up doses. It is important not to miss your dose. Call your care team if you are unable to keep an appointment. What may interact with this medication? Do not take this medication with any of the following: Deferoxamine Dimercaprol Other iron products This medication may also interact with the following: Chloramphenicol Deferasirox This list may not describe all possible interactions. Give your health care provider a list of all the medicines, herbs, non-prescription drugs, or dietary supplements you use. Also tell them if you smoke,  drink alcohol, or use illegal drugs. Some items may interact with your medicine. What should I watch for while using this medication? Visit your care team regularly. Tell your care team if your symptoms do not start to get better or if they get worse. You may need blood work done while you are taking this medication. You may need to follow a special diet. Talk to your care team. Foods that contain iron include: whole grains/cereals, dried fruits, beans, or peas, leafy green vegetables, and organ meats (liver, kidney). What side effects may I notice from receiving this medication? Side effects that you should report to your care team as soon as possible: Allergic reactions--skin rash, itching, hives, swelling of the face, lips, tongue, or throat Low blood pressure--dizziness, feeling faint or lightheaded, blurry vision Shortness of breath Side effects that usually do not require medical attention (report to your care team if they continue or are bothersome): Flushing Headache Joint pain Muscle pain Nausea Pain, redness, or irritation at injection site This list may not describe all possible side effects. Call your doctor for medical advice about side effects. You may report side effects to FDA at 1-800-FDA-1088. Where should I keep my medication? This medication is given in a hospital or clinic. It will not be stored at home. NOTE: This sheet is a summary. It may not cover all possible information. If you have questions about this medicine, talk to your doctor, pharmacist, or health care provider.  2024 Elsevier/Gold Standard (2022-07-30 00:00:00)

## 2022-12-09 NOTE — Addendum Note (Signed)
Addended by: Alinda Deem H on: 12/09/2022 03:34 PM   Modules accepted: Orders

## 2022-12-09 NOTE — Progress Notes (Signed)
[facility] CONSULT NOTE  Patient Care Team: Joaquim Nam, MD as PCP - General Mariah Milling, Tollie Pizza, MD as PCP - Cardiology (Cardiology) Antonieta Iba, MD as Consulting Physician (Cardiology) Oneta Rack, NP as Nurse Practitioner (Adult Health Nurse Practitioner) Earna Coder, MD as Consulting Physician (Oncology) Galen Manila, MD as Referring Physician (Ophthalmology)  CHIEF COMPLAINTS/PURPOSE OF CONSULTATION: ANEMIA  HEMATOLOGY HISTORY:  # CHRONIC INTERMITTENT ANEMIA - [since 2010]- Hb SEP 2022- 8; previously on PO Iron- WBC/platelets- Normal; MCV- 90s. EGD- > 15 years; /Colonoscopy:4 years- [Dr.Jacob;; in [place]] capsule-? Bone marrow Biopsy-none; OCT 2022- SEVERE IRON DEFICIENCY; myeloma work-up/hemolysis work-up negative; JAN 2023-CT scan duodenal thickening-recommend GI evaluation  #   AUG 2021- RIGHT LUNG stage I non-small cell lung cancer-  s/p SBRT  [Dr.Moody; [place]]; NOV 2022- CT chest-no recurrent disease.  # COPD on 2.5 lit/day [Dr.Sood]; diastolic CHF/ OCT 2022- Low BP [needing to come off losartan- Dr.Gollan]; peripheral neuropathy.  HISTORY OF PRESENTING ILLNESS: Alone; 3 Lit/ O2 24 x 7. In wheel chair.  Daughter [name], on phone.   Alexandria [name] 87 y.o. female with chronic respiratory failure, on oxygen, and severe iron deficiency anemia of unclear etiology- likely GI losses; unable to go through workup, who returns to clinic for follow up and consideration of IV iron. In interim, she was admitted to hospital from cancer center on 9/23 for acute on chronic respiratory failure with hypoxia, hypercapnia, covid 19 infection w/o pneumonia, ARDS d/t covid in setting of chronic COPD, CHF, CKD, OSA, IDA, and Afib. She required BiPAP, IV steroids and IV Lasix. She was weaned to her baseline oxygen and tapered steroids. She was discharged to SNF on 12/02/22.   She last received IV iron 11/17/22. Last received retacrit on 11/09/22.   Today, she is  feeling better. Continues home oxygen.   Review of Systems  Constitutional:  Positive for malaise/fatigue. Negative for chills, fever and weight loss.  HENT:  Negative for nosebleeds.   Respiratory:  Positive for shortness of breath. Negative for cough, hemoptysis and wheezing.   Cardiovascular:  Negative for chest pain, palpitations and leg swelling.  Gastrointestinal:  Negative for abdominal pain, blood in stool, constipation, diarrhea, melena, nausea and vomiting.  Genitourinary:  Negative for dysuria, hematuria and urgency.  Musculoskeletal:  Positive for back pain and joint pain. Negative for falls, myalgias and neck pain.  Skin:  Negative for itching and rash.  Neurological:  Positive for weakness (improved). Negative for dizziness, tingling, sensory change, loss of consciousness and headaches.  Endo/Heme/Allergies:  Negative for environmental allergies. Does not bruise/bleed easily.  Psychiatric/Behavioral:  Negative for depression. The patient is not nervous/anxious and does not have insomnia.     MEDICAL HISTORY:  Past Medical History:  Diagnosis Date   Allergy, unspecified not elsewhere classified    Anxiety    Aortic stenosis, mild 03/30/2022   a. TTE 03/29/2022: EF 55-60, no RWMA, GR 1 DD, normal RVSF, mild aortic stenosis (mean 10, V-max 212 cm/s, DI 0.70)   Cataract    Chronic diastolic congestive heart failure (HCC)    a. 06/2022 Echo: EF 60-65%, no rwma, nl RV fxn, mild MR, mild AS.   COPD (chronic obstructive pulmonary disease) (HCC) 03/08/1998   PFTs 12/12/2002 FEV 1 1.42 (64%) ratio 58 with no better after B2 and DLCO75%; PFTs 11/20/09 FEV1 1.50 (73%) ratio 50 no better after B2 with DLCO 62%; Hfa 50% 11/20/2009 >75%, 01/13/10 p coaching   Coronary artery calcification seen on  CT scan    a. 2012 Neg stress test.   Depression 12/07/1998   Diabetes mellitus type II 02/05/2006   Dr. Elvera Lennox with endo   Disorders of bursae and tendons in shoulder region, unspecified     Rotator cuff syndrome, right   E. coli bacteremia    Esophagitis    GERD (gastroesophageal reflux disease)    History of UTI    Hyperlipidemia 10/04/2000   Hypertension 03/08/1992   Hypothyroidism 03/08/1968   Iron deficiency anemia    Lung nodule    radiation starts 10-08-2019   Malignant neoplasm of right upper lobe of lung (HCC) 07/24/2019   Obesity    NOS   OSA (obstructive sleep apnea)    PSG 01/27/10 AHI 13, pt does not know CPAP settings   PAF (paroxysmal atrial fibrillation) (HCC)    a. 06/2022 in setting of UTI/encephalopathy-->converted on amio; b. CHA2DS2VASc = 6-->No OAC 2/2 chronic anemia/fall risk; c. 06/2022 Zio: Predominantly sinus bradycardia at 54 (3-158)'s.  1 brief run of SVT.  2.2% PVC burden.   Peripheral neuropathy    Likely due to DM per Dr. Tresa Endo hernia     SURGICAL HISTORY: Past Surgical History:  Procedure Laterality Date   ABD U/S  03/19/1999   Nml x2 foci in liver   ADENOSINE MYOVIEW  06/02/2007   Nml   CARDIOLITE PERSANTINE  08/24/2000   Nml   CAROTID U/S  08/24/2000   1-39% ICA stenosis   CAROTID U/S  06/02/2007   No apprec change    CARPAL TUNNEL RELEASE  12/1997   Right   CESAREAN SECTION     x2 Breech/ repeat   CHOLECYSTECTOMY  1997   COLONOSCOPY WITH PROPOFOL N/A 02/12/2016   Procedure: COLONOSCOPY WITH PROPOFOL;  Surgeon: Rachael Fee, MD;  Location: [facility];  Service: Endoscopy;  Laterality: N/A;   CT ABD W & PELVIS WO/W CM     Abd hemangiomas of liver, 1 cm R renal cyst   DENTAL SURGERY  2016   Implants   DEXA  07/03/2003   Nml   ESOPHAGOGASTRODUODENOSCOPY  12/05/1997   Nml (due to hoarseness)   ESOPHAGOGASTRODUODENOSCOPY (EGD) WITH PROPOFOL N/A 02/12/2016   Procedure: ESOPHAGOGASTRODUODENOSCOPY (EGD) WITH PROPOFOL;  Surgeon: Rachael Fee, MD;  Location: [facility];  Service: Endoscopy;  Laterality: N/A;   GALLBLADDER SURGERY     HERNIA REPAIR  01/24/2009   Lap Ventr w/ Lysis of adhesions (Dr. Dwain Sarna)    knee arthroscopic surgery  years ago   right   ROTATOR CUFF REPAIR  1984   Right, [place]   SHOULDER OPEN ROTATOR CUFF REPAIR  02/08/2012   Procedure: ROTATOR CUFF REPAIR SHOULDER OPEN;  Surgeon: Drucilla Schmidt, MD;  Location: [facility];  Service: Orthopedics;  Laterality: Left;  Left Shoulder Open Anterior Acrominectomy Rotator Cuff Repair Open Distal Clavicle Resection ,tissue mend graft, and repair of biceps tendon   THUMB RELEASE  12/1997   Right   TONSILLECTOMY     TOTAL ABDOMINAL HYSTERECTOMY  1985   Due to dysmennorhea   US ECHOCARDIOGRAPHY  06/02/2007    SOCIAL HISTORY: Social History   Socioeconomic History   Marital status: Widowed    Spouse name: Not on file   Number of children: 2   Years of education: Not on file   Highest education level: Not on file  Occupational History   Occupation: Retired    Associate Professor: RETIRED  Tobacco Use   Smoking status: Former  Current packs/day: 0.00    Average packs/day: 2.0 packs/day for 50.0 years (100.0 ttl pk-yrs)    Types: Cigarettes    Start date: 02/06/1955    Quit date: 02/05/2005    Years since quitting: 17.8   Smokeless tobacco: Never  Vaping Use   Vaping status: Never Used  Substance and Sexual Activity   Alcohol use: Yes    Comment: occasionally   Drug use: Never   Sexual activity: Not Currently  Other Topics Concern   Not on file  Social History Narrative   Widowed 2023, 2 children; Enjoys painting      Quit smoking in 2007; no alcohol; lives in [place]; [name]/d lives in [place]. Had own business. Dont drive- sec to neuropathy.    Social Determinants of Health   Financial Resource Strain: Low Risk  (10/21/2022)   Overall Financial Resource Strain (CARDIA)    Difficulty of Paying Living Expenses: Not hard at all  Food Insecurity: No Food Insecurity (11/17/2022)   Hunger Vital Sign    Worried About Running Out of Food in the Last Year: Never true    Ran Out of Food in the Last Year: Never true   Transportation Needs: No Transportation Needs (11/17/2022)   PRAPARE - Administrator, Civil Service (Medical): No    Lack of Transportation (Non-Medical): No  Physical Activity: Insufficiently Active (10/21/2022)   Exercise Vital Sign    Days of Exercise per Week: 3 days    Minutes of Exercise per Session: 20 min  Stress: No Stress Concern Present (10/21/2022)   [employer] of Occupational Health - Occupational Stress Questionnaire    Feeling of Stress : Not at all  Social Connections: Socially Isolated (10/21/2022)   Social Connection and Isolation Panel [NHANES]    Frequency of Communication with Friends and Family: More than three times a week    Frequency of Social Gatherings with Friends and Family: More than three times a week    Attends Religious Services: Never    Database administrator or Organizations: No    Attends Banker Meetings: Never    Marital Status: Widowed  Intimate Partner Violence: Not At Risk (11/17/2022)   Humiliation, Afraid, Rape, and Kick questionnaire    Fear of Current or Ex-Partner: No    Emotionally Abused: No    Physically Abused: No    Sexually Abused: No    FAMILY HISTORY: Family History  Problem Relation Age of Onset   Heart disease Mother    Thyroid disease Mother    Emphysema Father        One lung   Cystic fibrosis Sister    Hyperthyroidism Sister    Osteoarthritis Brother    Hyperthyroidism Brother    Esophageal cancer Neg Hx    Cancer Neg Hx        Head or neck   Colon cancer Neg Hx    Stomach cancer Neg Hx    Breast cancer Neg Hx     ALLERGIES:  is allergic to antihistamines, diphenhydramine-type; incruse ellipta [umeclidinium bromide]; clarithromycin; codeine; diphenhydramine; pregabalin; sulfonamide derivatives; tiotropium bromide monohydrate; umeclidinium; and vraylar [cariprazine].  MEDICATIONS:  Current Outpatient Medications  Medication Sig Dispense Refill   acetaminophen (TYLENOL) 325 MG  tablet Take 2 tablets (650 mg total) by mouth every 6 (six) hours as needed for mild pain, headache, fever or moderate pain.     albuterol (PROAIR HFA) 108 (90 Base) MCG/ACT inhaler Inhale 2 puffs into the lungs  every 6 (six) hours as needed for wheezing or shortness of breath. INHALE 2 PUFFS INTO THE LUNGS EVERY 6 HOURS AS NEEDED FOR WHEEZING OR SHORTNESS OF BREATH 3 Inhaler 3   amiodarone (PACERONE) 200 MG tablet TAKE 1 TABLET BY MOUTH EVERY DAY 90 tablet 0   arformoterol (BROVANA) 15 MCG/2ML NEBU Take 2 mLs (15 mcg total) by nebulization at bedtime. 120 mL 6   bisoprolol (ZEBETA) 5 MG tablet Take 0.5 tablets (2.5 mg total) by mouth daily. 45 tablet 1   budesonide (PULMICORT) 0.5 MG/2ML nebulizer solution Take 2 mLs (0.5 mg total) by nebulization in the morning and at bedtime. 360 mL 3   busPIRone (BUSPAR) 15 MG tablet Take 15 mg by mouth 3 (three) times daily.     Cholecalciferol (VITAMIN D) 50 MCG (2000 UT) tablet Take 1 tablet (2,000 Units total) by mouth daily.     Coenzyme Q-10 200 MG CAPS Take 200 mg by mouth daily.     Cyanocobalamin (B-12) 5000 MCG CAPS Take 5,000 mcg by mouth daily.     esomeprazole (NEXIUM) 40 MG capsule TAKE 1 CAPSULE (40 MG TOTAL) BY MOUTH DAILY AT 12 NOON. 90 capsule 3   ezetimibe (ZETIA) 10 MG tablet TAKE 1 TABLET BY MOUTH EVERYDAY AT BEDTIME 90 tablet 2   ferrous sulfate 325 (65 FE) MG tablet Take 1 tablet (325 mg total) by mouth daily. 90 tablet 3   FLUoxetine (PROZAC) 20 MG tablet Take 20 mg by mouth 2 (two) times daily.     fluticasone (FLONASE) 50 MCG/ACT nasal spray Place 2 sprays into both nostrils daily. 9.9 mL 2   glucose blood (ONETOUCH VERIO) test strip USE AS INSTRUCTED TO CHECK SUGAR 1 TIME DAILY 100 strip 3   guaiFENesin (MUCINEX) 600 MG 12 hr tablet Take 2 tablets (1,200 mg total) by mouth 2 (two) times daily as needed for cough or to loosen phlegm.     isosorbide mononitrate (IMDUR) 30 MG 24 hr tablet Take 30 mg by mouth daily.     levothyroxine  (SYNTHROID) 125 MCG tablet Take 1 tablet (125 mcg total) by mouth daily. 90 tablet 3   loratadine (CLARITIN) 10 MG tablet Take 1 tablet (10 mg total) by mouth daily as needed for allergies. 30 tablet 0   NOVOLIN R 100 UNIT/ML injection      OneTouch Delica Lancets 33G MISC Use 1x a day 100 each 3   OXYGEN Inhale into the lungs. 2lpm     polyethylene glycol (MIRALAX / GLYCOLAX) 17 g packet Take 17 g by mouth daily. 14 each 0   potassium chloride (KLOR-CON) 10 MEQ tablet TAKE 1 TABLET BY MOUTH EVERY DAY 90 tablet 3   predniSONE (DELTASONE) 10 MG tablet Take 4 tablets (40 mg total) by mouth daily for 2 days, THEN 3 tablets (30 mg total) daily for 2 days, THEN 2 tablets (20 mg total) daily for 2 days, THEN 1 tablet (10 mg total) daily for 2 days.     senna-docusate (SENOKOT-S) 8.6-50 MG tablet Take 1 tablet by mouth at bedtime. 30 tablet 0   Spacer/Aero Chamber Mouthpiece MISC Use with  inhaler as needed.  J44.9 1 each 0   nitroGLYCERIN (NITROSTAT) 0.4 MG SL tablet Place 1 tablet (0.4 mg total) under the tongue every 5 (five) minutes as needed for chest pain. (Patient not taking: Reported on 12/09/2022) 25 tablet 3   rosuvastatin (CRESTOR) 10 MG tablet TAKE 1 TABLET BY MOUTH EVERY OTHER DAY (Patient not taking:  Reported on 12/09/2022) 45 tablet 2   torsemide (DEMADEX) 20 MG tablet Take 2 tablets (40 mg total) by mouth daily. Take an additional 20 mg daily as needed for weight greater then 230 pounds 270 tablet 1   No current facility-administered medications for this visit.   Facility-Administered Medications Ordered in Other Visits  Medication Dose Route Frequency Provider Last Rate Last Admin   0.9 %  sodium chloride infusion   Intravenous Once Louretta Shorten R, MD       0.9 %  sodium chloride infusion   Intravenous Continuous Earna Coder, MD 20 mL/hr at 11/02/22 1421 New Bag at 11/02/22 1421    PHYSICAL EXAMINATION: Vitals:   12/09/22 1020 12/09/22 1030  BP: (!) 141/61 139/64   Pulse: (!) 57 (!) 57  Resp: (!) 24   Temp: 97.6 F (36.4 C)     Filed Weights   12/09/22 1020  Weight: 217 lb (98.4 kg)    Physical Exam Vitals reviewed.  Constitutional:      Appearance: She is morbidly obese. She is not ill-appearing.     Interventions: Nasal cannula in place.     Comments: Unaccompanied. Appears more alert. Chronically ill appearing.   Cardiovascular:     Rate and Rhythm: Normal rate and regular rhythm.  Pulmonary:     Effort: No respiratory distress.     Comments: Decreased breath sounds bilaterally.  Scattered wheezing. On oxygen via [place]- 3L. Short of breath with exertion.  Abdominal:     General: There is no distension.     Palpations: Abdomen is soft.     Tenderness: There is no abdominal tenderness.  Skin:    General: Skin is warm.     Coloration: Skin is not pale.  Neurological:     Mental Status: She is alert and oriented to person, place, and time.  Psychiatric:        Mood and Affect: Mood normal.        Behavior: Behavior normal.     LABORATORY DATA:  I have reviewed the data as listed Lab Results  Component Value Date   WBC 9.5 12/09/2022   HGB 9.7 (L) 12/09/2022   HCT 31.6 (L) 12/09/2022   MCV 102.9 (H) 12/09/2022   PLT 128 (L) 12/09/2022   Recent Labs    06/29/22 1437 06/30/22 0631 07/08/22 0000 08/03/22 1234 08/09/22 1305 08/19/22 1300 11/28/22 2108 11/29/22 0539 12/01/22 0343 12/02/22 0627 12/09/22 1002  NA 136   < > 143   < > 141   < > 145   < > 142 141 135  K 4.6   < > 3.9   < > 4.6   < > 4.1   < > 3.3* 3.4* 3.4*  CL 95*   < > 101   < > 102   < > 98   < > 96* 98 92*  CO2 38*   < > 39*   < > 31   < > 37*   < > 39* 36* 35*  GLUCOSE 130*   < >  --    < > 108*   < > 137*   < > 118* 150* 188*  BUN 33*   < > 40*   < > 26*   < > 38*   < > 49* 42* 26*  CREATININE 1.36*   < > 1.2*   < > 1.22*   < > 1.33*   < > 1.54* 1.42* 1.43*  CALCIUM  7.8*   < > 8.7   < > 8.2*   < > 8.4*   < > 7.9* 8.2* 8.2*  GFRNONAA 38*   < >  --    --  44*   < > 39*   < > 33* 36* 36*  PROT 6.3*  --   --   --  6.4*  --  6.1*  --   --   --   --   ALBUMIN 2.9*  --  3.2*  --  3.2*  --  3.1*  --   --   --   --   AST 16  --  14  --  15  --  17  --   --   --   --   ALT 16  --  22  --  15  --  29  --   --   --   --   ALKPHOS 100  --  78  --  73  --  97  --   --   --   --   BILITOT 0.8  --   --   --  0.3  --  0.7  --   --   --   --    < > = values in this interval not displayed.   Iron/TIBC/Ferritin/ %Sat    Component Value Date/Time   IRON 66 11/16/2022 1401   IRON 52 07/22/2022 0000   TIBC 354 11/16/2022 1401   FERRITIN 103 12/02/2022 0627   IRONPCTSAT 19 11/16/2022 1401    Assessment & Plan:   Symptomatic Anemia- 12/2020 ferritin 7. Symptomatic and chronic intermittent since 2010. S/p IV Venofer. April 2024, anemia worsening despite iron infusions. Iron sat 10%, ferritin 26. Question other etiologies including blood loss vs primary bone marrow disorders- additional workup was declined and she elected for symptomatic care. Hemoglobin baseline around 9-10. Dropped to 8.5 in February 2024 --> 7.2 (06/2022) -->> 6.4 (06/23/22). She received 2 units of pRBCs.- hmg worsened to 7.2- received 1 unit pRBCs. Hmg today improved to 9.7. Hold transfusion. Patient does have positive antibody screen and requires increased time to obtain blood product.  Iron Deficiency Anemia- increased iron requirement with drops in hemoglobin despite iron and previous transfusions- likely indicative of GI losses (see below). Plan for venofer today. She will likely need weekly to every other week venofer. Repeat iron studies in 2 weeks.  Thrombocytopenia- plt 128. Monitor.  Previous allergic reaction- she has been tolerating venofer well but previously experienced possible allergic reaction and has been premedicated with solumedrol.  Etiology of Iron Deficiency- unclear- CT 03/18/21 revealed mild wall thickening of the proximal duodenum, at least in part d/t under distension,  possibly reflective of PUD/duodenitis, s/p eval with Dr Christella Hartigan in [place]/ GI in June 2023. Per patient, she was not a candidate for upper and lower endoscopies. Last EGD 02/2016 revealed small hiatal hernia. Colonoscopy with sigmoid diverticulosis, lipoma of sigmoid colon, internal hemorrhoids. Likely continued blood loss. Last evaluated by GI in June 2024. Declined interventions and workup given her medical comorbidities. Daughter and patient wish to proceed with supportive care.    CKD- stage IIIb. GFR 44. Started EPO/retacrit 20,000 units. Previously reviewed how CKD can contribute to anemia and role of EPO injections. Tolerating retacrit well. Continue to optimize iron stores. Plan for retacrit every 2 weeks for hemoglobin < 10. Will plan for cbc, +/- retacrit next week.  B12 deficiency- 472 today. Decreased but normal. Monitor. Recheck  at next visit.  Stage I Lung Cancer- Aug 2021 right lung, NSCLC. S/p SBRT with Dr. Mitzi Hansen in [place]. 03/2022 CT negative for recurrent disease.  Diastolic chf- on home oxygen. Managed by cardiology.  Chest pain- on nitroglycerin. No recurrent episodes since improvement in hemoglobin. Encouraged follow up with cardiology.  COPD & Chronic respiratory failure - followed by pulmonology COVID-19 Infection- s/p hospitalization for ARDS. Discharged to rehab.  Metabolic encephalopathy- s/p hospitalization. D/t UTI. Discharged to rehab  UTI- 70,000 colonies of proteus on recent culture. Resolved Toenail avulsion & diabetic toenails- s/p referral and evaluation with podiatry.  Goals of care: We previously discussed potentially life limiting illness of anemia, bleeding. Patient and daughter verbalize desire for CPR however, patient would not want to be on life support. Previously provided her with MOST & advanced directives. Can also see vitual palliative care visit if assistance is needed.   Appts after 1 pm.  Disposition:  1 week- lab (cbc), +/- retacrit 2 weeks- - lab  (cbc, ferritin, iron studies, b12), see me, +/- venofer- la  No problem-specific Assessment & Plan notes found for this encounter.  All questions were answered. The patient knows to call the clinic with any problems, questions or concerns.   Alinda Dooms, NP 12/09/2022

## 2022-12-10 ENCOUNTER — Inpatient Hospital Stay: Payer: Medicare Other

## 2022-12-10 DIAGNOSIS — D508 Other iron deficiency anemias: Secondary | ICD-10-CM | POA: Diagnosis not present

## 2022-12-10 DIAGNOSIS — J9611 Chronic respiratory failure with hypoxia: Secondary | ICD-10-CM | POA: Diagnosis not present

## 2022-12-10 DIAGNOSIS — I130 Hypertensive heart and chronic kidney disease with heart failure and stage 1 through stage 4 chronic kidney disease, or unspecified chronic kidney disease: Secondary | ICD-10-CM | POA: Diagnosis not present

## 2022-12-10 DIAGNOSIS — I10 Essential (primary) hypertension: Secondary | ICD-10-CM | POA: Diagnosis not present

## 2022-12-10 DIAGNOSIS — R2681 Unsteadiness on feet: Secondary | ICD-10-CM | POA: Diagnosis not present

## 2022-12-10 DIAGNOSIS — M6281 Muscle weakness (generalized): Secondary | ICD-10-CM | POA: Diagnosis not present

## 2022-12-10 DIAGNOSIS — I5032 Chronic diastolic (congestive) heart failure: Secondary | ICD-10-CM | POA: Diagnosis not present

## 2022-12-10 DIAGNOSIS — E1122 Type 2 diabetes mellitus with diabetic chronic kidney disease: Secondary | ICD-10-CM | POA: Diagnosis not present

## 2022-12-10 DIAGNOSIS — R1030 Lower abdominal pain, unspecified: Secondary | ICD-10-CM | POA: Diagnosis not present

## 2022-12-10 DIAGNOSIS — N1832 Chronic kidney disease, stage 3b: Secondary | ICD-10-CM | POA: Diagnosis not present

## 2022-12-10 DIAGNOSIS — F331 Major depressive disorder, recurrent, moderate: Secondary | ICD-10-CM | POA: Diagnosis not present

## 2022-12-10 DIAGNOSIS — I480 Paroxysmal atrial fibrillation: Secondary | ICD-10-CM | POA: Diagnosis not present

## 2022-12-10 DIAGNOSIS — J449 Chronic obstructive pulmonary disease, unspecified: Secondary | ICD-10-CM | POA: Diagnosis not present

## 2022-12-10 DIAGNOSIS — Z8616 Personal history of COVID-19: Secondary | ICD-10-CM | POA: Diagnosis not present

## 2022-12-10 DIAGNOSIS — J441 Chronic obstructive pulmonary disease with (acute) exacerbation: Secondary | ICD-10-CM | POA: Diagnosis not present

## 2022-12-10 DIAGNOSIS — J9600 Acute respiratory failure, unspecified whether with hypoxia or hypercapnia: Secondary | ICD-10-CM | POA: Diagnosis not present

## 2022-12-10 DIAGNOSIS — R278 Other lack of coordination: Secondary | ICD-10-CM | POA: Diagnosis not present

## 2022-12-13 DIAGNOSIS — R1030 Lower abdominal pain, unspecified: Secondary | ICD-10-CM | POA: Diagnosis not present

## 2022-12-13 DIAGNOSIS — I480 Paroxysmal atrial fibrillation: Secondary | ICD-10-CM | POA: Diagnosis not present

## 2022-12-13 DIAGNOSIS — E1122 Type 2 diabetes mellitus with diabetic chronic kidney disease: Secondary | ICD-10-CM | POA: Diagnosis not present

## 2022-12-13 DIAGNOSIS — I5032 Chronic diastolic (congestive) heart failure: Secondary | ICD-10-CM | POA: Diagnosis not present

## 2022-12-13 DIAGNOSIS — N1832 Chronic kidney disease, stage 3b: Secondary | ICD-10-CM | POA: Diagnosis not present

## 2022-12-13 DIAGNOSIS — M6281 Muscle weakness (generalized): Secondary | ICD-10-CM | POA: Diagnosis not present

## 2022-12-13 DIAGNOSIS — J9600 Acute respiratory failure, unspecified whether with hypoxia or hypercapnia: Secondary | ICD-10-CM | POA: Diagnosis not present

## 2022-12-13 DIAGNOSIS — Z8616 Personal history of COVID-19: Secondary | ICD-10-CM | POA: Diagnosis not present

## 2022-12-13 DIAGNOSIS — I10 Essential (primary) hypertension: Secondary | ICD-10-CM | POA: Diagnosis not present

## 2022-12-13 DIAGNOSIS — D508 Other iron deficiency anemias: Secondary | ICD-10-CM | POA: Diagnosis not present

## 2022-12-13 DIAGNOSIS — R070 Pain in throat: Secondary | ICD-10-CM | POA: Diagnosis not present

## 2022-12-13 DIAGNOSIS — J441 Chronic obstructive pulmonary disease with (acute) exacerbation: Secondary | ICD-10-CM | POA: Diagnosis not present

## 2022-12-14 DIAGNOSIS — I5032 Chronic diastolic (congestive) heart failure: Secondary | ICD-10-CM | POA: Diagnosis not present

## 2022-12-14 DIAGNOSIS — J9611 Chronic respiratory failure with hypoxia: Secondary | ICD-10-CM | POA: Diagnosis not present

## 2022-12-14 DIAGNOSIS — F331 Major depressive disorder, recurrent, moderate: Secondary | ICD-10-CM | POA: Diagnosis not present

## 2022-12-14 DIAGNOSIS — R278 Other lack of coordination: Secondary | ICD-10-CM | POA: Diagnosis not present

## 2022-12-14 DIAGNOSIS — R2681 Unsteadiness on feet: Secondary | ICD-10-CM | POA: Diagnosis not present

## 2022-12-14 DIAGNOSIS — J449 Chronic obstructive pulmonary disease, unspecified: Secondary | ICD-10-CM | POA: Diagnosis not present

## 2022-12-14 DIAGNOSIS — M6281 Muscle weakness (generalized): Secondary | ICD-10-CM | POA: Diagnosis not present

## 2022-12-15 ENCOUNTER — Encounter: Payer: Self-pay | Admitting: Pulmonary Disease

## 2022-12-15 ENCOUNTER — Other Ambulatory Visit: Payer: Self-pay | Admitting: *Deleted

## 2022-12-15 ENCOUNTER — Ambulatory Visit (INDEPENDENT_AMBULATORY_CARE_PROVIDER_SITE_OTHER): Payer: Medicare Other | Admitting: Pulmonary Disease

## 2022-12-15 VITALS — BP 126/60 | HR 63 | Temp 98.3°F | Ht 65.0 in | Wt 219.4 lb

## 2022-12-15 DIAGNOSIS — J9611 Chronic respiratory failure with hypoxia: Secondary | ICD-10-CM

## 2022-12-15 DIAGNOSIS — J441 Chronic obstructive pulmonary disease with (acute) exacerbation: Secondary | ICD-10-CM | POA: Diagnosis not present

## 2022-12-15 DIAGNOSIS — M6281 Muscle weakness (generalized): Secondary | ICD-10-CM | POA: Diagnosis not present

## 2022-12-15 DIAGNOSIS — E538 Deficiency of other specified B group vitamins: Secondary | ICD-10-CM

## 2022-12-15 DIAGNOSIS — D631 Anemia in chronic kidney disease: Secondary | ICD-10-CM

## 2022-12-15 DIAGNOSIS — D500 Iron deficiency anemia secondary to blood loss (chronic): Secondary | ICD-10-CM

## 2022-12-15 DIAGNOSIS — N1832 Chronic kidney disease, stage 3b: Secondary | ICD-10-CM | POA: Diagnosis not present

## 2022-12-15 DIAGNOSIS — I5032 Chronic diastolic (congestive) heart failure: Secondary | ICD-10-CM | POA: Diagnosis not present

## 2022-12-15 DIAGNOSIS — I480 Paroxysmal atrial fibrillation: Secondary | ICD-10-CM | POA: Diagnosis not present

## 2022-12-15 DIAGNOSIS — I10 Essential (primary) hypertension: Secondary | ICD-10-CM | POA: Diagnosis not present

## 2022-12-15 DIAGNOSIS — D508 Other iron deficiency anemias: Secondary | ICD-10-CM | POA: Diagnosis not present

## 2022-12-15 DIAGNOSIS — R1030 Lower abdominal pain, unspecified: Secondary | ICD-10-CM | POA: Diagnosis not present

## 2022-12-15 DIAGNOSIS — G4733 Obstructive sleep apnea (adult) (pediatric): Secondary | ICD-10-CM | POA: Diagnosis not present

## 2022-12-15 DIAGNOSIS — I130 Hypertensive heart and chronic kidney disease with heart failure and stage 1 through stage 4 chronic kidney disease, or unspecified chronic kidney disease: Secondary | ICD-10-CM | POA: Diagnosis not present

## 2022-12-15 DIAGNOSIS — E1122 Type 2 diabetes mellitus with diabetic chronic kidney disease: Secondary | ICD-10-CM | POA: Diagnosis not present

## 2022-12-15 MED ORDER — REVEFENACIN 175 MCG/3ML IN SOLN: 175.0000 ug | Freq: Every day | RESPIRATORY_TRACT | 11 refills | Status: DC

## 2022-12-15 NOTE — Patient Instructions (Signed)
Continue arformoterol and budesonide nebulized twice a day  Add Yupelri nebulized once daily, we can consider adding additional medicines in the future  We will put the order in for a mask fitting, nasal pillows  Return to clinic in 3 months or sooner as needed with Dr. Judeth Horn

## 2022-12-16 ENCOUNTER — Inpatient Hospital Stay: Payer: Medicare Other

## 2022-12-16 VITALS — BP 119/60 | HR 57

## 2022-12-16 DIAGNOSIS — N1832 Chronic kidney disease, stage 3b: Secondary | ICD-10-CM | POA: Diagnosis not present

## 2022-12-16 DIAGNOSIS — D500 Iron deficiency anemia secondary to blood loss (chronic): Secondary | ICD-10-CM

## 2022-12-16 DIAGNOSIS — D62 Acute posthemorrhagic anemia: Secondary | ICD-10-CM

## 2022-12-16 DIAGNOSIS — R5383 Other fatigue: Secondary | ICD-10-CM | POA: Diagnosis not present

## 2022-12-16 DIAGNOSIS — I130 Hypertensive heart and chronic kidney disease with heart failure and stage 1 through stage 4 chronic kidney disease, or unspecified chronic kidney disease: Secondary | ICD-10-CM | POA: Diagnosis not present

## 2022-12-16 DIAGNOSIS — D631 Anemia in chronic kidney disease: Secondary | ICD-10-CM | POA: Diagnosis not present

## 2022-12-16 DIAGNOSIS — D509 Iron deficiency anemia, unspecified: Secondary | ICD-10-CM | POA: Diagnosis not present

## 2022-12-16 DIAGNOSIS — M549 Dorsalgia, unspecified: Secondary | ICD-10-CM | POA: Diagnosis not present

## 2022-12-16 LAB — IRON AND TIBC
Iron: 61 ug/dL (ref 28–170)
Saturation Ratios: 21 % (ref 10.4–31.8)
TIBC: 287 ug/dL (ref 250–450)
UIBC: 226 ug/dL

## 2022-12-16 LAB — CMP (CANCER CENTER ONLY)
ALT: 20 U/L (ref 0–44)
AST: 19 U/L (ref 15–41)
Albumin: 3.3 g/dL — ABNORMAL LOW (ref 3.5–5.0)
Alkaline Phosphatase: 106 U/L (ref 38–126)
Anion gap: 10 (ref 5–15)
BUN: 23 mg/dL (ref 8–23)
CO2: 33 mmol/L — ABNORMAL HIGH (ref 22–32)
Calcium: 8.3 mg/dL — ABNORMAL LOW (ref 8.9–10.3)
Chloride: 97 mmol/L — ABNORMAL LOW (ref 98–111)
Creatinine: 1.47 mg/dL — ABNORMAL HIGH (ref 0.44–1.00)
GFR, Estimated: 35 mL/min — ABNORMAL LOW (ref >60)
Glucose, Bld: 217 mg/dL — ABNORMAL HIGH (ref 70–99)
Potassium: 3.8 mmol/L (ref 3.5–5.1)
Sodium: 140 mmol/L (ref 135–145)
Total Bilirubin: 0.8 mg/dL (ref 0.3–1.2)
Total Protein: 5.6 g/dL — ABNORMAL LOW (ref 6.5–8.1)

## 2022-12-16 LAB — CBC WITH DIFFERENTIAL/PLATELET
Abs Immature Granulocytes: 0.05 10*3/uL (ref 0.00–0.07)
Basophils Absolute: 0 10*3/uL (ref 0.0–0.1)
Basophils Relative: 0 %
Eosinophils Absolute: 0.1 10*3/uL (ref 0.0–0.5)
Eosinophils Relative: 1 %
HCT: 31.4 % — ABNORMAL LOW (ref 36.0–46.0)
Hemoglobin: 9.7 g/dL — ABNORMAL LOW (ref 12.0–15.0)
Immature Granulocytes: 1 %
Lymphocytes Relative: 9 %
Lymphs Abs: 0.7 10*3/uL (ref 0.7–4.0)
MCH: 31.9 pg (ref 26.0–34.0)
MCHC: 30.9 g/dL (ref 30.0–36.0)
MCV: 103.3 fL — ABNORMAL HIGH (ref 80.0–100.0)
Monocytes Absolute: 0.5 10*3/uL (ref 0.1–1.0)
Monocytes Relative: 7 %
Neutro Abs: 6.1 10*3/uL (ref 1.7–7.7)
Neutrophils Relative %: 82 %
Platelets: 159 10*3/uL (ref 150–400)
RBC: 3.04 MIL/uL — ABNORMAL LOW (ref 3.87–5.11)
RDW: 14.7 % (ref 11.5–15.5)
WBC: 7.4 10*3/uL (ref 4.0–10.5)
nRBC: 0 % (ref 0.0–0.2)

## 2022-12-16 LAB — VITAMIN B12: Vitamin B-12: 1291 pg/mL — ABNORMAL HIGH (ref 180–914)

## 2022-12-16 LAB — FERRITIN: Ferritin: 258 ng/mL (ref 11–307)

## 2022-12-16 MED ORDER — EPOETIN ALFA-EPBX 20000 UNIT/ML IJ SOLN
20000.0000 [IU] | Freq: Once | INTRAMUSCULAR | Status: AC
  Administered 2022-12-16: 20000 [IU] via SUBCUTANEOUS
  Filled 2022-12-16: qty 1

## 2022-12-16 NOTE — Progress Notes (Signed)
@Patient  ID: Alexandria [name], female    DOB: [birth year], 87 y.o.   MRN: [id number]  Chief Complaint  Patient presents with   Hospitalization Follow-up    9/22-9/26 Acute on Chronic Respiratory failure with hypoxia and hypercapnia    Referring provider: Joaquim Nam, MD  HPI:   87 y.o. woman with severe COPD on PFTs 2016, history of non-small lung cancer status post SBRT in 2021 whom we are seeing in hospital follow-up for recurrent exacerbations of COPD.  Daughter accompanied via phone during majority of visit.  Admitted to the hospital twice in short succession the last few weeks.  Worsening hypoxemia.  Hypercarbia.  Needing NIPPV.  Treated with steroids antibiotics.  Improved somewhat.  First time at rehab but had to come back.  Currently in rehab again.  Chest x-ray on admission first admission chest x-ray is clear on my review interpretation.  X-ray on admission second admission x-ray clear on my review and interpretation.  S/p for COVID.  During one of the admissions.  She was discharged to rehab with a CPAP machine.  At an auto titrating CPAP on review of machine today.  She is unable to tolerate this.  The mask feels too stuffy, too much pressure etc.  Notably she was diagnosed with sleep apnea in 2018.  She is unable to tolerate mask at that time.  The machine was sent back.  There was some misunderstanding that this is a new problem.  We discussed that this is an issue dating back prior to her recent hospitalization.  She feels more short of breath.  Not back to baseline.  Currently on budesonide and arformoterol nebulized twice daily per review of skilled nursing facility [facility].  We discussed adding LAMA therapy via nebulizer.  In the past she had cough with LAMA DPI but this is likely due to inactive ingredient, DPI as opposed to actual gradient.  Discussed with both patient and daughter in trying medication.  Most recent cross-sectional imaging CT scan 7/24 demonstrates stable  right upper lobe fibrotic lesion.  No  Questionaires / Pulmonary Flowsheets:   ACT:      No data to display          MMRC: mMRC Dyspnea Scale mMRC Score  01/30/2020 10:47 AM 3    Epworth:      No data to display          Tests:   FENO:  No results found for: "NITRICOXIDE"  PFT:    Latest Ref Rng & Units 08/15/2014    1:36 PM  PFT Results  FVC-Pre L 1.92   FVC-Predicted Pre % 70   FVC-Post L 1.95   FVC-Predicted Post % 71   Pre FEV1/FVC % % 53   Post FEV1/FCV % % 55   FEV1-Pre L 1.01   FEV1-Predicted Pre % 49   FEV1-Post L 1.07   DLCO uncorrected ml/min/mmHg 11.56   DLCO UNC% % 47   DLVA Predicted % 70   TLC L 6.49   TLC % Predicted % 128   RV % Predicted % 188   Personally reviewed interpreted as severe COPD with hyperinflation, air trapping, severely reduced DLCO  WALK:     12/28/2018    5:07 PM 01/12/2016   11:00 AM 07/31/2014    4:38 PM 08/24/2012    4:15 PM 10/02/2010    9:55 AM  SIX MIN WALK  Supplimental Oxygen during Test? (L/min) No No No No No  Tech Comments: Patient  was off of the 3L of oxygen while in the room for a total of 18 minutes. Patient O2 was already down to 88 before the walk started. Pt. walked at a slow steady pace, she was only able to do 1 lap due to dyspnea Pt's lowest ambulatory stat was 92 on RA. Pt walked at a slower pace with three breaks.  Test stopped due to "toes cramping" and increased SOB although the pt did not appear to be dyspenic//lmr test completed.  pt did c/o feeling light headed, tightness in chest, and burning in throat while walking.     Imaging: Personally reviewed and as per EMR and discussion in this note NM Pulmonary Perfusion  Result Date: 11/30/2022 CLINICAL DATA:  Elevated D-dimer, COPD, hypoxia EXAM: NUCLEAR MEDICINE PERFUSION LUNG SCAN TECHNIQUE: Perfusion images were obtained in multiple projections after intravenous injection of radiopharmaceutical. Ventilation scans intentionally deferred if  perfusion scan and chest x-ray adequate for interpretation during COVID 19 epidemic. RADIOPHARMACEUTICALS:  5.12 mCi Tc-62m MAA IV COMPARISON:  Radiograph from the same day. FINDINGS: Heterogenous distribution of radiopharmaceutical. No discrete segmental or subsegmental perfusion defects. IMPRESSION: Low likelihood ratio for pulmonary embolism. Electronically Signed   By: Corlis Leak M.D.   On: 11/30/2022 15:19   DG Chest Port 1 View  Result Date: 11/30/2022 CLINICAL DATA:  Hypoxia EXAM: PORTABLE CHEST 1 VIEW COMPARISON:  Chest x-ray 11/28/2022 FINDINGS: Patient is rotated. The heart size and mediastinal contours are within normal limits. Both lungs are clear. The visualized skeletal structures are unremarkable. IMPRESSION: No active disease. Electronically Signed   By: Darliss Cheney M.D.   On: 11/30/2022 15:13   DG Chest Port 1 View  Result Date: 11/28/2022 CLINICAL DATA:  Shortness of breath EXAM: PORTABLE CHEST 1 VIEW COMPARISON:  11/23/2022 FINDINGS: Cardiomegaly with aortic atherosclerosis. No acute airspace disease or pleural effusion. No pneumothorax IMPRESSION: Cardiomegaly. Electronically Signed   By: Jasmine Pang M.D.   On: 11/28/2022 22:05   DG Chest 2 View  Result Date: 11/23/2022 CLINICAL DATA:  Shortness of breath. EXAM: CHEST - 2 VIEW COMPARISON:  November 16, 2022. FINDINGS: Stable cardiomegaly. No acute pulmonary disease is noted. Bony thorax is unremarkable. IMPRESSION: No active cardiopulmonary disease. Aortic Atherosclerosis (ICD10-I70.0). Electronically Signed   By: Lupita Raider M.D.   On: 11/23/2022 15:34   CT ABDOMEN PELVIS W CONTRAST  Result Date: 11/16/2022 CLINICAL DATA:  Right-sided abdominal pain. Known hernia with known cancer. Non-small-cell lung cancer. * Tracking Code: BO * EXAM: CT ABDOMEN AND PELVIS WITH CONTRAST TECHNIQUE: Multidetector CT imaging of the abdomen and pelvis was performed using the standard protocol following bolus administration of intravenous  contrast. RADIATION DOSE REDUCTION: This exam was performed according to the departmental dose-optimization program which includes automated exposure control, adjustment of the mA and/or kV according to patient size and/or use of iterative reconstruction technique. CONTRAST:  80mL OMNIPAQUE IOHEXOL 300 MG/ML  SOLN COMPARISON:  Abdomen and pelvis CT 03/17/2021. Chest CT scan 09/17/2022 FINDINGS: Lower chest: There is some basilar atelectasis. No pleural effusion. There is a 3 mm nodule medial left lower lobe on series 7, image 5. Calcifications along the mitral valve annulus. No pericardial effusion. Coronary artery calcifications are seen. Hepatobiliary: Once again there are hepatic hemangiomas in segment 8 and 2 as on previous. Previous cholecystectomy. Patent portal vein. Pancreas: Moderate atrophy of the pancreas. Spleen: Normal in size without focal abnormality. Adrenals/Urinary Tract: The adrenal glands are preserved. No collecting system dilatation. The ureters have normal course  and caliber extending down to the bladder. Preserved contours of the urinary bladder. Once again the kidneys has some Bosniak 1 renal cysts. Largest is seen exophytic from the anterior aspect of the right kidney measuring 3.5 cm. Hounsfield unit of 15. However there is 1 lesion along the upper aspect of the left kidney laterally which is more complex. Precontrast density of 37 38, portal venous phase 52 and delayed with a somewhat thick potentially enhancing wall and Hounsfield units of 65. Possible solid mass or renal cell carcinoma. Recommend further workup when appropriate. No abnormal calcifications are seen within either kidney nor along the course of either ureter on the noncontrast dataset. Stomach/Bowel: Stomach is underdistended on this non oral contrast exam. Question fold thickening. Please correlate with symptoms. The small bowel is nondilated. There is extensive colonic diverticulosis without dilatation or obstruction.  Normal appendix seen retrocecal. Vascular/Lymphatic: Extensive vascular calcifications along the aorta and branch vessels. Normal caliber aorta and IVC. No discrete abnormal lymph node enlargement identified in the abdomen and pelvis. Reproductive: Status post hysterectomy. No adnexal masses. Other: No free air or free fluid. There is mesh along the anterior abdominal wall. Musculoskeletal: Osteopenia. Scattered degenerative changes. Trace anterolisthesis of L5 on S1. Significant degenerative changes as well of the pelvis. IMPRESSION: Extensive colonic diverticulosis. Normal appendix. No bowel obstruction, free air or free fluid. There is collapsed stomach but gastric fold thickening. Please correlate with symptoms. Bilateral renal cystic foci. There is 1 potentially enhancing lesion in the upper pole left kidney measuring 19 mm. Recommend dedicated MRI evaluation when appropriate to further assess and exclude underlying aggressive lesion. Known hepatic hemangiomas. Electronically Signed   By: Karen Kays M.D.   On: 11/16/2022 19:33   DG Chest 2 View  Result Date: 11/16/2022 CLINICAL DATA:  Shortness of breath. EXAM: CHEST - 2 VIEW COMPARISON:  Chest radiographs 07/05/2022, 06/29/2022; CT chest 09/17/2022 and 03/19/2022 FINDINGS: Cardiac silhouette is again mildly to mildly enlarged. Moderate atherosclerotic calcification is again seen within the aortic arch. There is again flattening of the diaphragms and mild hyperinflation. Unchanged opacity overlying the superomedial right lung corresponding to the chronic scarring seen on prior 09/17/2022 and 03/19/2022 CTs. Mild blunting of the posterior costophrenic angle is similar to 03/04/2022 and favored represent mild scarring. No definite pleural effusion. No pneumothorax. Moderate multilevel degenerative disc changes of the thoracic spine. IMPRESSION: Mild chronic hyperinflation.  No acute lung process. Electronically Signed   By: Neita Garnet M.D.   On:  11/16/2022 17:02    Lab Results: Personally reviewed CBC    Component Value Date/Time   WBC 9.5 12/09/2022 1002   WBC 5.7 12/02/2022 0627   RBC 3.07 (L) 12/09/2022 1002   HGB 9.7 (L) 12/09/2022 1002   HGB 10.7 (L) 05/23/2019 1141   HCT 31.6 (L) 12/09/2022 1002   HCT 32.9 (L) 05/23/2019 1141   PLT 128 (L) 12/09/2022 1002   PLT 180 05/23/2019 1141   MCV 102.9 (H) 12/09/2022 1002   MCV 95 05/23/2019 1141   MCH 31.6 12/09/2022 1002   MCHC 30.7 12/09/2022 1002   RDW 14.8 12/09/2022 1002   RDW 12.8 05/23/2019 1141   LYMPHSABS 0.5 (L) 12/09/2022 1002   MONOABS 0.6 12/09/2022 1002   EOSABS 0.0 12/09/2022 1002   BASOSABS 0.0 12/09/2022 1002    BMET    Component Value Date/Time   NA 135 12/09/2022 1002   NA 143 07/08/2022 0000   K 3.4 (L) 12/09/2022 1002   CL 92 (L) 12/09/2022  1002   CO2 35 (H) 12/09/2022 1002   GLUCOSE 188 (H) 12/09/2022 1002   BUN 26 (H) 12/09/2022 1002   BUN 40 (A) 07/08/2022 0000   CREATININE 1.43 (H) 12/09/2022 1002   CREATININE 0.86 01/25/2020 1450   CALCIUM 8.2 (L) 12/09/2022 1002   GFRNONAA 36 (L) 12/09/2022 1002   GFRAA >60 12/03/2019 1212    BNP    Component Value Date/Time   BNP 141.5 (H) 11/28/2022 2108    ProBNP    Component Value Date/Time   PROBNP 69.0 04/22/2022 1624    Specialty Problems       Pulmonary Problems   COPD with chronic bronchitis and emphysema (HCC)    Followed in Pulmonary clinic/ [facility]/ [facility] - PFT's 12/12/2002 FEV1 1.42 (64%) ratio 58 with no better after B2 and DLCO 75%  - PFT's 9/ 15/11 FEV1 1.50 (73%) ratio 50 no better after B2 with DLC0 62%  - PFT's 10/10/2012   1.29 (61%) ratio 57 and no better p B2, DLCO  67% and corrects to 81  >>referred to pulmonary rehab 10/19/2010 > some better doe - Alpha one AT genotype 04/15/2011 > MM -   08/24/2012  Walked RA x 1 laps @ 185 ft each stopped due to feet hurt > sob s desat - 10/17/2014 p extensive coaching HFA effectiveness =    90%       Obstructive  sleep apnea    Qualifier: Diagnosis of  By: Craige Cotta MD, Vineet        DOE (dyspnea on exertion)    Followed in Pulmonary clinic/ [facility]/ [place]  - 08/24/2012  Walked RA x 1 laps @ 185 ft each stopped due to sob/feet hurt      COPD exacerbation (HCC)   Obesity hypoventilation syndrome (HCC)   OSA on CPAP   Hiatal hernia   Chronic respiratory failure with hypoxia (HCC)   Rhinitis   Malignant neoplasm of right upper lobe of lung (HCC)   Sore throat   Acute cough   Wheezing   Acute on chronic respiratory failure with hypoxia and hypercapnia (HCC)   COPD with acute exacerbation (HCC)   Acute on chronic respiratory failure with hypercapnia (HCC)   COPD (chronic obstructive pulmonary disease) (HCC)   Acute respiratory disease due to COVID-19 virus    Allergies  Allergen Reactions   Antihistamines, Diphenhydramine-Type Other (See Comments)    Able to tolerate only allegra.    Incruse Ellipta [Umeclidinium Bromide] Cough    Caused voice change and severe coughing   Clarithromycin Other (See Comments)    REACTION: diff swallowing and mouth blisters   Codeine Other (See Comments)    Unknown    Diphenhydramine    Pregabalin Other (See Comments)    Lack of effect for neuropathy pain.   Sulfonamide Derivatives    Tiotropium Bromide Monohydrate Other (See Comments)    Voice changes   Umeclidinium    Vraylar [Cariprazine]     intolerant    Immunization History  Administered Date(s) Administered   DT (Pediatric) 12/23/2010   Fluad Quad(high Dose 65+) 12/28/2018, 12/05/2019, 03/16/2022   H1N1 05/30/2008   Influenza Split 12/23/2010, 12/16/2011, 01/08/2013   Influenza Whole 12/07/1995, 12/07/2005, 01/04/2008, 01/06/2010   Influenza, High Dose Seasonal PF 01/06/2016, 01/06/2017, 01/07/2021   Influenza,inj,Quad PF,6+ Mos 12/26/2013, 12/05/2014, 11/28/2017   Influenza-Unspecified 03/16/2022   Moderna Covid-19 Seasonal Vaccine 6 months thru 85years of age 73/02/2020    PFIZER(Purple Top)SARS-COV-2 Vaccination 03/27/2019, 04/18/2019, 01/18/2020  PPD Test 10/02/2014, 07/07/2022   Pneumococcal Conjugate-13 08/17/2013   Pneumococcal Polysaccharide-23 12/06/2000, 03/08/2005, 10/24/2008, 12/28/2018   Td 03/08/1993, 10/04/2000, 12/23/2010   Tdap 10/01/2016, 04/30/2021   Zoster, Live 12/07/2005    Past Medical History:  Diagnosis Date   Allergy, unspecified not elsewhere classified    Anxiety    Aortic stenosis, mild 03/30/2022   a. TTE 03/29/2022: EF 55-60, no RWMA, GR 1 DD, normal RVSF, mild aortic stenosis (mean 10, V-max 212 cm/s, DI 0.70)   Cataract    Chronic diastolic congestive heart failure (HCC)    a. 06/2022 Echo: EF 60-65%, no rwma, nl RV fxn, mild MR, mild AS.   COPD (chronic obstructive pulmonary disease) (HCC) 03/08/1998   PFTs 12/12/2002 FEV 1 1.42 (64%) ratio 58 with no better after B2 and DLCO75%; PFTs 11/20/09 FEV1 1.50 (73%) ratio 50 no better after B2 with DLCO 62%; Hfa 50% 11/20/2009 >75%, 01/13/10 p coaching   Coronary artery calcification seen on CT scan    a. 2012 Neg stress test.   Depression 12/07/1998   Diabetes mellitus type II 02/05/2006   Dr. Elvera Lennox with endo   Disorders of bursae and tendons in shoulder region, unspecified    Rotator cuff syndrome, right   E. coli bacteremia    Esophagitis    GERD (gastroesophageal reflux disease)    History of UTI    Hyperlipidemia 10/04/2000   Hypertension 03/08/1992   Hypothyroidism 03/08/1968   Iron deficiency anemia    Lung nodule    radiation starts 10-08-2019   Malignant neoplasm of right upper lobe of lung (HCC) 07/24/2019   Obesity    NOS   OSA (obstructive sleep apnea)    PSG 01/27/10 AHI 13, pt does not know CPAP settings   PAF (paroxysmal atrial fibrillation) (HCC)    a. 06/2022 in setting of UTI/encephalopathy-->converted on amio; b. CHA2DS2VASc = 6-->No OAC 2/2 chronic anemia/fall risk; c. 06/2022 Zio: Predominantly sinus bradycardia at 54 (3-158)'s.  1 brief run of SVT.   2.2% PVC burden.   Peripheral neuropathy    Likely due to DM per Dr. Tresa Endo hernia     Tobacco History: Social History   Tobacco Use  Smoking Status Former   Current packs/day: 0.00   Average packs/day: 2.0 packs/day for 50.0 years (100.0 ttl pk-yrs)   Types: Cigarettes   Start date: 02/06/1955   Quit date: 02/05/2005   Years since quitting: 17.8  Smokeless Tobacco Never   Counseling given: Not Answered   Continue to not smoke  Outpatient Encounter Medications as of 12/15/2022  Medication Sig   acetaminophen (TYLENOL) 325 MG tablet Take 2 tablets (650 mg total) by mouth every 6 (six) hours as needed for mild pain, headache, fever or moderate pain.   albuterol (PROAIR HFA) 108 (90 Base) MCG/ACT inhaler Inhale 2 puffs into the lungs every 6 (six) hours as needed for wheezing or shortness of breath. INHALE 2 PUFFS INTO THE LUNGS EVERY 6 HOURS AS NEEDED FOR WHEEZING OR SHORTNESS OF BREATH   amiodarone (PACERONE) 200 MG tablet TAKE 1 TABLET BY MOUTH EVERY DAY   arformoterol (BROVANA) 15 MCG/2ML NEBU Take 2 mLs (15 mcg total) by nebulization at bedtime.   bisoprolol (ZEBETA) 5 MG tablet Take 0.5 tablets (2.5 mg total) by mouth daily.   budesonide (PULMICORT) 0.5 MG/2ML nebulizer solution Take 2 mLs (0.5 mg total) by nebulization in the morning and at bedtime.   busPIRone (BUSPAR) 15 MG tablet Take 15 mg by mouth  3 (three) times daily.   Cholecalciferol (VITAMIN D) 50 MCG (2000 UT) tablet Take 1 tablet (2,000 Units total) by mouth daily.   Coenzyme Q-10 200 MG CAPS Take 200 mg by mouth daily.   Cyanocobalamin (B-12) 5000 MCG CAPS Take 5,000 mcg by mouth daily.   esomeprazole (NEXIUM) 40 MG capsule TAKE 1 CAPSULE (40 MG TOTAL) BY MOUTH DAILY AT 12 NOON.   ezetimibe (ZETIA) 10 MG tablet TAKE 1 TABLET BY MOUTH EVERYDAY AT BEDTIME   ferrous sulfate 325 (65 FE) MG tablet Take 1 tablet (325 mg total) by mouth daily.   FLUoxetine (PROZAC) 20 MG tablet Take 20 mg by mouth 2 (two) times  daily.   fluticasone (FLONASE) 50 MCG/ACT nasal spray Place 2 sprays into both nostrils daily.   glucose blood (ONETOUCH VERIO) test strip USE AS INSTRUCTED TO CHECK SUGAR 1 TIME DAILY   guaiFENesin (MUCINEX) 600 MG 12 hr tablet Take 2 tablets (1,200 mg total) by mouth 2 (two) times daily as needed for cough or to loosen phlegm.   isosorbide mononitrate (IMDUR) 30 MG 24 hr tablet Take 30 mg by mouth daily.   levothyroxine (SYNTHROID) 125 MCG tablet Take 1 tablet (125 mcg total) by mouth daily.   loratadine (CLARITIN) 10 MG tablet Take 1 tablet (10 mg total) by mouth daily as needed for allergies.   nitroGLYCERIN (NITROSTAT) 0.4 MG SL tablet Place 1 tablet (0.4 mg total) under the tongue every 5 (five) minutes as needed for chest pain.   NOVOLIN R 100 UNIT/ML injection    OneTouch Delica Lancets 33G MISC Use 1x a day   OXYGEN Inhale into the lungs. 2lpm   polyethylene glycol (MIRALAX / GLYCOLAX) 17 g packet Take 17 g by mouth daily.   potassium chloride (KLOR-CON) 10 MEQ tablet TAKE 1 TABLET BY MOUTH EVERY DAY   revefenacin (YUPELRI) 175 MCG/3ML nebulizer solution Take 3 mLs (175 mcg total) by nebulization daily.   rosuvastatin (CRESTOR) 10 MG tablet TAKE 1 TABLET BY MOUTH EVERY OTHER DAY   senna-docusate (SENOKOT-S) 8.6-50 MG tablet Take 1 tablet by mouth at bedtime.   Spacer/Aero Chamber [employer] Use with  inhaler as needed.  J44.9   torsemide (DEMADEX) 20 MG tablet Take 2 tablets (40 mg total) by mouth daily. Take an additional 20 mg daily as needed for weight greater then 230 pounds   Facility-Administered Encounter Medications as of 12/15/2022  Medication   0.9 %  sodium chloride infusion   0.9 %  sodium chloride infusion     Review of Systems  Review of Systems  No chest pain with exertion, no orthopnea or PND.  Comprehensive review of systems otherwise negative. Physical Exam  BP 126/60 (BP Location: Left Arm, Patient Position: Sitting, Cuff Size: Normal)   Pulse 63    Temp 98.3 F (36.8 C) (Oral)   Ht 5\' 5"  (1.651 m)   Wt 219 lb 6.4 oz (99.5 kg)   LMP  (LMP Unknown)   SpO2 95%   BMI 36.51 kg/m   Wt Readings from Last 5 Encounters:  12/15/22 219 lb 6.4 oz (99.5 kg)  12/09/22 217 lb (98.4 kg)  12/02/22 228 lb 9.9 oz (103.7 kg)  11/24/22 253 lb 8.5 oz (115 kg)  11/16/22 233 lb (105.7 kg)    BMI Readings from Last 5 Encounters:  12/15/22 36.51 kg/m  12/09/22 36.11 kg/m  12/02/22 38.04 kg/m  11/24/22 42.19 kg/m  11/16/22 38.77 kg/m     Physical Exam General: Chronically ill  and frail appearing sitting up in wheelchair Eyes: EOMI, no icterus Neck: Supple, no JVP appreciated Pulmonary: Distant, clear, normal work of breathing on nasal cannula Cardiovascular: Regular rate and rhythm, no murmur appreciated Abdomen: Nondistended, bowel sounds present MSK: No synovitis, no joint effusion Neuro: No focal deficits, in a wheelchair, does not ambulate Psych: Normal mood, full affect  Assessment & Plan:   Dyspnea on exertion: Suspect multifactorial and related to significant deconditioning with recent hospitalizations.  Large contribution from severe COPD that likely has worsened over time with multiple exacerbations given known decrease in lung function with exacerbations in the setting of COPD.  Recommend increasing nebulized therapies as below.  Severe COPD associated on PFTs, spirometry 2016 with hyperinflation and air trapping on lung volumes.  Currently on arformoterol and budesonide nebulized.  Add Yupelri nebulized once daily for triple inhaled therapy LAMA LABA ICS.  Consider addition oftouvayre in the future.  Taking stepwise approach to increasing therapy.  OSA: Diagnosed 2018 on sleep study.  She did not tolerate wearing the mask at that time.  The machine was taken away.  Recently placed on an auto titrating CPAP on discharge her recent hospitalization.  No sleep test has been done.  It is unclear if the current settings or auto  titrated would be appropriate or not.  Similar to 03/27/2016, she did tolerate the mask.  We can trial pillows given as a CPAP.  New order for DME nasal pillows today.  Consider changing ramp time, increasing ramp time in the future to see if this helps.  Ultimately given she has been unsuccessful in tolerating CPAP mask past, I fear she will have similar challenges now.  Not sure much we can do to help but we can try these interventions of these.    Return in about 3 months (around 03/17/2023).   Karren Burly, MD 12/16/2022   This appointment required 45 minutes of patient care (this includes precharting, chart review, review of results, face-to-face care, etc.).

## 2022-12-19 DIAGNOSIS — F331 Major depressive disorder, recurrent, moderate: Secondary | ICD-10-CM | POA: Diagnosis not present

## 2022-12-19 DIAGNOSIS — R2681 Unsteadiness on feet: Secondary | ICD-10-CM | POA: Diagnosis not present

## 2022-12-19 DIAGNOSIS — M6281 Muscle weakness (generalized): Secondary | ICD-10-CM | POA: Diagnosis not present

## 2022-12-19 DIAGNOSIS — I5032 Chronic diastolic (congestive) heart failure: Secondary | ICD-10-CM | POA: Diagnosis not present

## 2022-12-19 DIAGNOSIS — R278 Other lack of coordination: Secondary | ICD-10-CM | POA: Diagnosis not present

## 2022-12-19 DIAGNOSIS — J9611 Chronic respiratory failure with hypoxia: Secondary | ICD-10-CM | POA: Diagnosis not present

## 2022-12-19 DIAGNOSIS — J449 Chronic obstructive pulmonary disease, unspecified: Secondary | ICD-10-CM | POA: Diagnosis not present

## 2022-12-21 ENCOUNTER — Other Ambulatory Visit: Payer: Self-pay | Admitting: Nurse Practitioner

## 2022-12-21 ENCOUNTER — Encounter: Payer: Medicare Other | Admitting: Family

## 2022-12-21 ENCOUNTER — Encounter: Payer: Self-pay | Admitting: Family Medicine

## 2022-12-21 ENCOUNTER — Ambulatory Visit (INDEPENDENT_AMBULATORY_CARE_PROVIDER_SITE_OTHER): Payer: Medicare Other | Admitting: Family Medicine

## 2022-12-21 VITALS — BP 132/78 | HR 57 | Temp 98.7°F | Ht 65.0 in | Wt 219.0 lb

## 2022-12-21 DIAGNOSIS — R278 Other lack of coordination: Secondary | ICD-10-CM | POA: Diagnosis not present

## 2022-12-21 DIAGNOSIS — M545 Low back pain, unspecified: Secondary | ICD-10-CM | POA: Diagnosis not present

## 2022-12-21 DIAGNOSIS — R52 Pain, unspecified: Secondary | ICD-10-CM

## 2022-12-21 DIAGNOSIS — M549 Dorsalgia, unspecified: Secondary | ICD-10-CM | POA: Diagnosis not present

## 2022-12-21 DIAGNOSIS — M6281 Muscle weakness (generalized): Secondary | ICD-10-CM | POA: Diagnosis not present

## 2022-12-21 DIAGNOSIS — N289 Disorder of kidney and ureter, unspecified: Secondary | ICD-10-CM

## 2022-12-21 DIAGNOSIS — R2681 Unsteadiness on feet: Secondary | ICD-10-CM | POA: Diagnosis not present

## 2022-12-21 DIAGNOSIS — J449 Chronic obstructive pulmonary disease, unspecified: Secondary | ICD-10-CM | POA: Diagnosis not present

## 2022-12-21 DIAGNOSIS — F331 Major depressive disorder, recurrent, moderate: Secondary | ICD-10-CM | POA: Diagnosis not present

## 2022-12-21 DIAGNOSIS — J9611 Chronic respiratory failure with hypoxia: Secondary | ICD-10-CM | POA: Diagnosis not present

## 2022-12-21 DIAGNOSIS — I5032 Chronic diastolic (congestive) heart failure: Secondary | ICD-10-CM | POA: Diagnosis not present

## 2022-12-21 MED ORDER — LIDOCAINE 5 % EX PTCH: 1.0000 | MEDICATED_PATCH | CUTANEOUS | Status: DC

## 2022-12-21 NOTE — Progress Notes (Unsigned)
Living at [facility] place.  Was discharged from inpatient stay after admission with covid.  She needs help getting in/out of bed at baseline now.  Using wheelchair at office visit today.  She doesn't recall recent fall.  Back pain started after discharge from hospital per patient report.    Slept in a recliner the last 4 days and that helped with pain.  Lower midline back pain.  Constant pain but sometimes worse than others.  She walked about 40 ft with PT a few days ago.    She had xray done this AM at [facility] place along with blood/urine labs.  I don't have results yet.  I asked patient and caregiver to please have those results sent to me.  No fevers or chills.  Pain radiates around to the waist B.  Abd wall isn't hypersensitive.  Lidocaine patch helps some.  No rash.  She had a BM yesterday.  She has normal BMs per patient report.  Previous imaging noted with  Musculoskeletal: Osteopenia. Scattered degenerative changes. Trace anterolisthesis of L5 on S1. Significant degenerative changes as well of the pelvis.  Bilateral renal cystic foci. There is 1 potentially enhancing lesion in the upper pole left kidney measuring 19 mm. Recommend dedicated MRI evaluation when appropriate to further assess and exclude underlying aggressive lesion.  Discussed renal finding but at this point I do not think she would be able to tolerate laying down for MRI.  Discussed.  Meds, vitals, and allergies reviewed.   ROS: Per HPI unless specifically indicated in ROS section   Nad Ncat In wheelchair On O2 via [place] No focal dec in BS rrr Lower back TTP B w/o bruising.  No CVA pain.  No rash. Patient was able to stand with some assistance from me and Joellen in clinic.  She stood so I could examine her back.  She was able to sit back in a wheelchair without complication. Normal BS, abdomen soft. S/S grossly wnl BLE  41 minutes were devoted to patient care in this encounter (this includes time spent reviewing the  patient's file/history, interviewing and examining the patient, counseling/reviewing plan with patient).

## 2022-12-21 NOTE — Patient Instructions (Addendum)
Please have Malvin Johns send a copy of your results (xrays and labs).    Please ask the staff MD/PA about possible gabapentin use, especially if you have a compression fracture.    If your med list here doesn't match the list at [facility] place or if you/they have questions about your med list, then please update [place].   Take care.  Glad to see you.

## 2022-12-22 ENCOUNTER — Encounter: Payer: Self-pay | Admitting: Family Medicine

## 2022-12-22 ENCOUNTER — Other Ambulatory Visit: Payer: Self-pay | Admitting: *Deleted

## 2022-12-22 ENCOUNTER — Ambulatory Visit: Payer: Medicare Other | Admitting: Nurse Practitioner

## 2022-12-22 ENCOUNTER — Telehealth: Payer: Self-pay | Admitting: Gastroenterology

## 2022-12-22 DIAGNOSIS — M549 Dorsalgia, unspecified: Secondary | ICD-10-CM | POA: Insufficient documentation

## 2022-12-22 DIAGNOSIS — D500 Iron deficiency anemia secondary to blood loss (chronic): Secondary | ICD-10-CM

## 2022-12-22 DIAGNOSIS — D62 Acute posthemorrhagic anemia: Secondary | ICD-10-CM

## 2022-12-22 DIAGNOSIS — R1030 Lower abdominal pain, unspecified: Secondary | ICD-10-CM | POA: Diagnosis not present

## 2022-12-22 DIAGNOSIS — M5459 Other low back pain: Secondary | ICD-10-CM | POA: Diagnosis not present

## 2022-12-22 DIAGNOSIS — N289 Disorder of kidney and ureter, unspecified: Secondary | ICD-10-CM | POA: Insufficient documentation

## 2022-12-22 HISTORY — DX: Disorder of kidney and ureter, unspecified: N28.9

## 2022-12-22 NOTE — Telephone Encounter (Signed)
Attempted to return call X2, call is transferred and then no one answers. Called back again and spoke with Alexandria [name] the receptionist at [facility], she states she is going to take a message for  Diane to call back.

## 2022-12-22 NOTE — Assessment & Plan Note (Signed)
There is 1 potentially enhancing lesion in the upper pole left kidney measuring 19 mm. Recommend dedicated MRI evaluation when appropriate to further assess and exclude underlying aggressive lesion.  Patient agreed to defer MRI for now based on her back pain.

## 2022-12-22 NOTE — Assessment & Plan Note (Signed)
She reports having imaging done this morning.  I asked patient/caregiver to see about getting the results sent here.  She has previous degenerative changes noted on previous CT.  Please have Malvin Johns send a copy of your results (xrays and labs).    I asked her to check with the staff MD/PA at [facility] about possible gabapentin use, especially if she has a compression fracture.  Rationale discussed with patient.  Med list updated.  Advised that if her med list here doesn't match the list at [facility] place or if she/they have questions about your med list, then please update [place].

## 2022-12-22 NOTE — Telephone Encounter (Signed)
Inbound call from Diane from [facility] stating patient has been having severe lower abdominal pain. Requesting a call back at [phone] to discuss further. Please advise, thank you.

## 2022-12-23 ENCOUNTER — Inpatient Hospital Stay: Payer: Medicare Other

## 2022-12-23 ENCOUNTER — Inpatient Hospital Stay: Payer: Medicare Other | Admitting: Hospice and Palliative Medicine

## 2022-12-23 ENCOUNTER — Inpatient Hospital Stay
Admission: EM | Admit: 2022-12-23 | Discharge: 2023-02-06 | DRG: 391 | Disposition: E | Payer: Medicare Other | Source: Skilled Nursing Facility | Attending: Internal Medicine | Admitting: Internal Medicine

## 2022-12-23 ENCOUNTER — Other Ambulatory Visit: Payer: Self-pay

## 2022-12-23 ENCOUNTER — Emergency Department: Payer: Medicare Other

## 2022-12-23 DIAGNOSIS — K219 Gastro-esophageal reflux disease without esophagitis: Secondary | ICD-10-CM | POA: Insufficient documentation

## 2022-12-23 DIAGNOSIS — I5032 Chronic diastolic (congestive) heart failure: Secondary | ICD-10-CM | POA: Diagnosis present

## 2022-12-23 DIAGNOSIS — N1832 Chronic kidney disease, stage 3b: Secondary | ICD-10-CM | POA: Diagnosis not present

## 2022-12-23 DIAGNOSIS — D1803 Hemangioma of intra-abdominal structures: Secondary | ICD-10-CM | POA: Diagnosis not present

## 2022-12-23 DIAGNOSIS — Z98891 History of uterine scar from previous surgery: Secondary | ICD-10-CM

## 2022-12-23 DIAGNOSIS — J9622 Acute and chronic respiratory failure with hypercapnia: Secondary | ICD-10-CM | POA: Diagnosis not present

## 2022-12-23 DIAGNOSIS — E1122 Type 2 diabetes mellitus with diabetic chronic kidney disease: Secondary | ICD-10-CM | POA: Diagnosis present

## 2022-12-23 DIAGNOSIS — K5730 Diverticulosis of large intestine without perforation or abscess without bleeding: Secondary | ICD-10-CM | POA: Diagnosis not present

## 2022-12-23 DIAGNOSIS — E1142 Type 2 diabetes mellitus with diabetic polyneuropathy: Secondary | ICD-10-CM | POA: Diagnosis present

## 2022-12-23 DIAGNOSIS — J449 Chronic obstructive pulmonary disease, unspecified: Secondary | ICD-10-CM | POA: Diagnosis not present

## 2022-12-23 DIAGNOSIS — G4733 Obstructive sleep apnea (adult) (pediatric): Secondary | ICD-10-CM | POA: Diagnosis present

## 2022-12-23 DIAGNOSIS — S32010A Wedge compression fracture of first lumbar vertebra, initial encounter for closed fracture: Secondary | ICD-10-CM

## 2022-12-23 DIAGNOSIS — Z8349 Family history of other endocrine, nutritional and metabolic diseases: Secondary | ICD-10-CM

## 2022-12-23 DIAGNOSIS — Z90710 Acquired absence of both cervix and uterus: Secondary | ICD-10-CM

## 2022-12-23 DIAGNOSIS — X58XXXA Exposure to other specified factors, initial encounter: Secondary | ICD-10-CM | POA: Diagnosis present

## 2022-12-23 DIAGNOSIS — Z9049 Acquired absence of other specified parts of digestive tract: Secondary | ICD-10-CM

## 2022-12-23 DIAGNOSIS — F40240 Claustrophobia: Secondary | ICD-10-CM | POA: Diagnosis present

## 2022-12-23 DIAGNOSIS — Z66 Do not resuscitate: Secondary | ICD-10-CM | POA: Diagnosis not present

## 2022-12-23 DIAGNOSIS — M4856XA Collapsed vertebra, not elsewhere classified, lumbar region, initial encounter for fracture: Secondary | ICD-10-CM | POA: Diagnosis present

## 2022-12-23 DIAGNOSIS — E876 Hypokalemia: Secondary | ICD-10-CM | POA: Diagnosis present

## 2022-12-23 DIAGNOSIS — D508 Other iron deficiency anemias: Secondary | ICD-10-CM | POA: Diagnosis not present

## 2022-12-23 DIAGNOSIS — Z8616 Personal history of COVID-19: Secondary | ICD-10-CM | POA: Diagnosis not present

## 2022-12-23 DIAGNOSIS — J9602 Acute respiratory failure with hypercapnia: Secondary | ICD-10-CM | POA: Diagnosis not present

## 2022-12-23 DIAGNOSIS — Z8261 Family history of arthritis: Secondary | ICD-10-CM

## 2022-12-23 DIAGNOSIS — Z9089 Acquired absence of other organs: Secondary | ICD-10-CM

## 2022-12-23 DIAGNOSIS — S8012XA Contusion of left lower leg, initial encounter: Secondary | ICD-10-CM | POA: Diagnosis present

## 2022-12-23 DIAGNOSIS — Z7901 Long term (current) use of anticoagulants: Secondary | ICD-10-CM

## 2022-12-23 DIAGNOSIS — Z825 Family history of asthma and other chronic lower respiratory diseases: Secondary | ICD-10-CM

## 2022-12-23 DIAGNOSIS — Z6836 Body mass index (BMI) 36.0-36.9, adult: Secondary | ICD-10-CM | POA: Diagnosis not present

## 2022-12-23 DIAGNOSIS — I130 Hypertensive heart and chronic kidney disease with heart failure and stage 1 through stage 4 chronic kidney disease, or unspecified chronic kidney disease: Secondary | ICD-10-CM | POA: Diagnosis not present

## 2022-12-23 DIAGNOSIS — E669 Obesity, unspecified: Secondary | ICD-10-CM | POA: Diagnosis present

## 2022-12-23 DIAGNOSIS — J9811 Atelectasis: Secondary | ICD-10-CM | POA: Diagnosis not present

## 2022-12-23 DIAGNOSIS — Z85118 Personal history of other malignant neoplasm of bronchus and lung: Secondary | ICD-10-CM

## 2022-12-23 DIAGNOSIS — K5792 Diverticulitis of intestine, part unspecified, without perforation or abscess without bleeding: Secondary | ICD-10-CM | POA: Diagnosis not present

## 2022-12-23 DIAGNOSIS — Z8619 Personal history of other infectious and parasitic diseases: Secondary | ICD-10-CM

## 2022-12-23 DIAGNOSIS — K5732 Diverticulitis of large intestine without perforation or abscess without bleeding: Secondary | ICD-10-CM | POA: Diagnosis present

## 2022-12-23 DIAGNOSIS — E8729 Other acidosis: Secondary | ICD-10-CM | POA: Diagnosis not present

## 2022-12-23 DIAGNOSIS — R1030 Lower abdominal pain, unspecified: Secondary | ICD-10-CM | POA: Diagnosis not present

## 2022-12-23 DIAGNOSIS — N390 Urinary tract infection, site not specified: Secondary | ICD-10-CM

## 2022-12-23 DIAGNOSIS — J9621 Acute and chronic respiratory failure with hypoxia: Secondary | ICD-10-CM | POA: Diagnosis not present

## 2022-12-23 DIAGNOSIS — I350 Nonrheumatic aortic (valve) stenosis: Secondary | ICD-10-CM | POA: Diagnosis present

## 2022-12-23 DIAGNOSIS — Z7189 Other specified counseling: Secondary | ICD-10-CM | POA: Diagnosis not present

## 2022-12-23 DIAGNOSIS — R918 Other nonspecific abnormal finding of lung field: Secondary | ICD-10-CM | POA: Diagnosis not present

## 2022-12-23 DIAGNOSIS — Z9889 Other specified postprocedural states: Secondary | ICD-10-CM

## 2022-12-23 DIAGNOSIS — Z79899 Other long term (current) drug therapy: Secondary | ICD-10-CM

## 2022-12-23 DIAGNOSIS — E039 Hypothyroidism, unspecified: Secondary | ICD-10-CM | POA: Diagnosis not present

## 2022-12-23 DIAGNOSIS — Z885 Allergy status to narcotic agent status: Secondary | ICD-10-CM

## 2022-12-23 DIAGNOSIS — J9601 Acute respiratory failure with hypoxia: Secondary | ICD-10-CM | POA: Diagnosis not present

## 2022-12-23 DIAGNOSIS — N281 Cyst of kidney, acquired: Secondary | ICD-10-CM | POA: Diagnosis not present

## 2022-12-23 DIAGNOSIS — J441 Chronic obstructive pulmonary disease with (acute) exacerbation: Secondary | ICD-10-CM | POA: Diagnosis not present

## 2022-12-23 DIAGNOSIS — F419 Anxiety disorder, unspecified: Secondary | ICD-10-CM | POA: Diagnosis not present

## 2022-12-23 DIAGNOSIS — I480 Paroxysmal atrial fibrillation: Secondary | ICD-10-CM | POA: Diagnosis not present

## 2022-12-23 DIAGNOSIS — D500 Iron deficiency anemia secondary to blood loss (chronic): Secondary | ICD-10-CM | POA: Diagnosis present

## 2022-12-23 DIAGNOSIS — K2970 Gastritis, unspecified, without bleeding: Secondary | ICD-10-CM | POA: Diagnosis present

## 2022-12-23 DIAGNOSIS — Z87891 Personal history of nicotine dependence: Secondary | ICD-10-CM

## 2022-12-23 DIAGNOSIS — Z87440 Personal history of urinary (tract) infections: Secondary | ICD-10-CM

## 2022-12-23 DIAGNOSIS — I700 Atherosclerosis of aorta: Secondary | ICD-10-CM | POA: Diagnosis not present

## 2022-12-23 DIAGNOSIS — F32A Depression, unspecified: Secondary | ICD-10-CM | POA: Insufficient documentation

## 2022-12-23 DIAGNOSIS — A0472 Enterocolitis due to Clostridium difficile, not specified as recurrent: Secondary | ICD-10-CM | POA: Diagnosis not present

## 2022-12-23 DIAGNOSIS — I10 Essential (primary) hypertension: Secondary | ICD-10-CM | POA: Diagnosis not present

## 2022-12-23 DIAGNOSIS — K5900 Constipation, unspecified: Secondary | ICD-10-CM | POA: Diagnosis present

## 2022-12-23 DIAGNOSIS — Z888 Allergy status to other drugs, medicaments and biological substances status: Secondary | ICD-10-CM

## 2022-12-23 DIAGNOSIS — Z515 Encounter for palliative care: Secondary | ICD-10-CM

## 2022-12-23 DIAGNOSIS — Z79890 Hormone replacement therapy (postmenopausal): Secondary | ICD-10-CM

## 2022-12-23 DIAGNOSIS — N3000 Acute cystitis without hematuria: Principal | ICD-10-CM

## 2022-12-23 DIAGNOSIS — E1151 Type 2 diabetes mellitus with diabetic peripheral angiopathy without gangrene: Secondary | ICD-10-CM | POA: Diagnosis present

## 2022-12-23 DIAGNOSIS — G9349 Other encephalopathy: Secondary | ICD-10-CM | POA: Diagnosis not present

## 2022-12-23 DIAGNOSIS — E785 Hyperlipidemia, unspecified: Secondary | ICD-10-CM | POA: Insufficient documentation

## 2022-12-23 DIAGNOSIS — R54 Age-related physical debility: Secondary | ICD-10-CM | POA: Diagnosis present

## 2022-12-23 DIAGNOSIS — D7589 Other specified diseases of blood and blood-forming organs: Secondary | ICD-10-CM | POA: Diagnosis present

## 2022-12-23 DIAGNOSIS — I2510 Atherosclerotic heart disease of native coronary artery without angina pectoris: Secondary | ICD-10-CM | POA: Insufficient documentation

## 2022-12-23 DIAGNOSIS — R001 Bradycardia, unspecified: Secondary | ICD-10-CM | POA: Diagnosis not present

## 2022-12-23 DIAGNOSIS — I959 Hypotension, unspecified: Secondary | ICD-10-CM | POA: Diagnosis not present

## 2022-12-23 DIAGNOSIS — R0602 Shortness of breath: Secondary | ICD-10-CM | POA: Diagnosis not present

## 2022-12-23 DIAGNOSIS — Z5320 Procedure and treatment not carried out because of patient's decision for unspecified reasons: Secondary | ICD-10-CM | POA: Diagnosis present

## 2022-12-23 DIAGNOSIS — R1084 Generalized abdominal pain: Secondary | ICD-10-CM | POA: Diagnosis not present

## 2022-12-23 DIAGNOSIS — M6281 Muscle weakness (generalized): Secondary | ICD-10-CM | POA: Diagnosis not present

## 2022-12-23 DIAGNOSIS — Z8249 Family history of ischemic heart disease and other diseases of the circulatory system: Secondary | ICD-10-CM

## 2022-12-23 DIAGNOSIS — Z602 Problems related to living alone: Secondary | ICD-10-CM | POA: Diagnosis present

## 2022-12-23 DIAGNOSIS — Z882 Allergy status to sulfonamides status: Secondary | ICD-10-CM

## 2022-12-23 LAB — URINALYSIS, ROUTINE W REFLEX MICROSCOPIC
Bilirubin Urine: NEGATIVE
Glucose, UA: NEGATIVE mg/dL
Hgb urine dipstick: NEGATIVE
Ketones, ur: NEGATIVE mg/dL
Nitrite: NEGATIVE
Protein, ur: NEGATIVE mg/dL
Specific Gravity, Urine: 1.026 (ref 1.005–1.030)
WBC, UA: >50 WBC/hpf (ref 0–5)
pH: 6 (ref 5.0–8.0)

## 2022-12-23 LAB — CBC
HCT: 31.4 % — ABNORMAL LOW (ref 36.0–46.0)
Hemoglobin: 9.5 g/dL — ABNORMAL LOW (ref 12.0–15.0)
MCH: 31.8 pg (ref 26.0–34.0)
MCHC: 30.3 g/dL (ref 30.0–36.0)
MCV: 105 fL — ABNORMAL HIGH (ref 80.0–100.0)
Platelets: 166 10*3/uL (ref 150–400)
RBC: 2.99 MIL/uL — ABNORMAL LOW (ref 3.87–5.11)
RDW: 15.4 % (ref 11.5–15.5)
WBC: 6.5 10*3/uL (ref 4.0–10.5)
nRBC: 0 % (ref 0.0–0.2)

## 2022-12-23 LAB — COMPREHENSIVE METABOLIC PANEL
ALT: 16 U/L (ref 0–44)
AST: 16 U/L (ref 15–41)
Albumin: 3.2 g/dL — ABNORMAL LOW (ref 3.5–5.0)
Alkaline Phosphatase: 102 U/L (ref 38–126)
Anion gap: 12 (ref 5–15)
BUN: 24 mg/dL — ABNORMAL HIGH (ref 8–23)
CO2: 31 mmol/L (ref 22–32)
Calcium: 8.6 mg/dL — ABNORMAL LOW (ref 8.9–10.3)
Chloride: 99 mmol/L (ref 98–111)
Creatinine, Ser: 1.39 mg/dL — ABNORMAL HIGH (ref 0.44–1.00)
GFR, Estimated: 37 mL/min — ABNORMAL LOW (ref >60)
Glucose, Bld: 165 mg/dL — ABNORMAL HIGH (ref 70–99)
Potassium: 3.1 mmol/L — ABNORMAL LOW (ref 3.5–5.1)
Sodium: 142 mmol/L (ref 135–145)
Total Bilirubin: 0.6 mg/dL (ref 0.3–1.2)
Total Protein: 6.2 g/dL — ABNORMAL LOW (ref 6.5–8.1)

## 2022-12-23 LAB — PROCALCITONIN: Procalcitonin: <0.1 ng/mL

## 2022-12-23 LAB — LIPASE, BLOOD: Lipase: 27 U/L (ref 11–51)

## 2022-12-23 MED ORDER — AMIODARONE HCL 200 MG PO TABS
200.0000 mg | ORAL_TABLET | Freq: Every day | ORAL | Status: DC
  Administered 2022-12-24 – 2023-01-06 (×14): 200 mg via ORAL
  Filled 2022-12-23 (×15): qty 1

## 2022-12-23 MED ORDER — VITAMIN D 25 MCG (1000 UNIT) PO TABS
2000.0000 [IU] | ORAL_TABLET | Freq: Every day | ORAL | Status: DC
  Administered 2022-12-24 – 2023-01-06 (×14): 2000 [IU] via ORAL
  Filled 2022-12-23 (×14): qty 2

## 2022-12-23 MED ORDER — TRAZODONE HCL 50 MG PO TABS
25.0000 mg | ORAL_TABLET | Freq: Every evening | ORAL | Status: DC | PRN
  Administered 2022-12-24 – 2023-01-06 (×4): 25 mg via ORAL
  Filled 2022-12-23 (×4): qty 1

## 2022-12-23 MED ORDER — FLUOXETINE HCL 20 MG PO CAPS
20.0000 mg | ORAL_CAPSULE | Freq: Two times a day (BID) | ORAL | Status: DC
  Administered 2022-12-24 – 2023-01-06 (×29): 20 mg via ORAL
  Filled 2022-12-23 (×29): qty 1

## 2022-12-23 MED ORDER — LEVOTHYROXINE SODIUM 50 MCG PO TABS
125.0000 ug | ORAL_TABLET | Freq: Every day | ORAL | Status: DC
  Administered 2022-12-24 – 2023-01-07 (×15): 125 ug via ORAL
  Filled 2022-12-23 (×13): qty 1
  Filled 2022-12-23: qty 3
  Filled 2022-12-23: qty 1

## 2022-12-23 MED ORDER — FERROUS SULFATE 325 (65 FE) MG PO TABS
325.0000 mg | ORAL_TABLET | Freq: Every day | ORAL | Status: DC
  Administered 2022-12-24 – 2023-01-06 (×14): 325 mg via ORAL
  Filled 2022-12-23 (×14): qty 1

## 2022-12-23 MED ORDER — ONDANSETRON HCL 4 MG/2ML IJ SOLN: 4.0000 mg | Freq: Four times a day (QID) | INTRAMUSCULAR | Status: DC | PRN

## 2022-12-23 MED ORDER — GUAIFENESIN ER 600 MG PO TB12
1200.0000 mg | ORAL_TABLET | Freq: Two times a day (BID) | ORAL | Status: DC | PRN
  Administered 2023-01-03: 1200 mg via ORAL
  Filled 2022-12-23: qty 2

## 2022-12-23 MED ORDER — POTASSIUM CHLORIDE 20 MEQ PO PACK
40.0000 meq | PACK | Freq: Once | ORAL | Status: AC
  Administered 2022-12-23: 40 meq via ORAL
  Filled 2022-12-23: qty 2

## 2022-12-23 MED ORDER — LACTATED RINGERS IV SOLN: INTRAVENOUS | Status: AC

## 2022-12-23 MED ORDER — BISOPROLOL FUMARATE 5 MG PO TABS
2.5000 mg | ORAL_TABLET | Freq: Every day | ORAL | Status: DC
  Filled 2022-12-23: qty 0.5

## 2022-12-23 MED ORDER — SODIUM CHLORIDE 0.9 % IV SOLN
2.0000 g | Freq: Once | INTRAVENOUS | Status: DC
  Filled 2022-12-23: qty 20

## 2022-12-23 MED ORDER — FENTANYL CITRATE PF 50 MCG/ML IJ SOSY
25.0000 ug | PREFILLED_SYRINGE | Freq: Once | INTRAMUSCULAR | Status: AC
  Administered 2022-12-23: 25 ug via INTRAVENOUS
  Filled 2022-12-23: qty 1

## 2022-12-23 MED ORDER — TORSEMIDE 20 MG PO TABS: 40.0000 mg | ORAL_TABLET | Freq: Every day | ORAL | Status: DC

## 2022-12-23 MED ORDER — REVEFENACIN 175 MCG/3ML IN SOLN
175.0000 ug | Freq: Every day | RESPIRATORY_TRACT | Status: DC
  Administered 2022-12-24 – 2023-01-07 (×15): 175 ug via RESPIRATORY_TRACT
  Filled 2022-12-23 (×15): qty 3

## 2022-12-23 MED ORDER — ALBUTEROL SULFATE (2.5 MG/3ML) 0.083% IN NEBU
3.0000 mL | INHALATION_SOLUTION | Freq: Four times a day (QID) | RESPIRATORY_TRACT | Status: DC | PRN
  Administered 2022-12-28 – 2023-01-01 (×2): 3 mL via RESPIRATORY_TRACT
  Filled 2022-12-23 (×2): qty 3

## 2022-12-23 MED ORDER — SENNOSIDES-DOCUSATE SODIUM 8.6-50 MG PO TABS
1.0000 | ORAL_TABLET | Freq: Every day | ORAL | Status: DC
  Administered 2022-12-24 (×2): 1 via ORAL
  Filled 2022-12-23 (×4): qty 1

## 2022-12-23 MED ORDER — ACETAMINOPHEN 325 MG PO TABS
650.0000 mg | ORAL_TABLET | Freq: Four times a day (QID) | ORAL | Status: DC | PRN
  Administered 2022-12-24 – 2022-12-25 (×2): 650 mg via ORAL
  Filled 2022-12-23 (×2): qty 2

## 2022-12-23 MED ORDER — ACETAMINOPHEN 650 MG RE SUPP: 650.0000 mg | Freq: Four times a day (QID) | RECTAL | Status: DC | PRN

## 2022-12-23 MED ORDER — ARFORMOTEROL TARTRATE 15 MCG/2ML IN NEBU
15.0000 ug | INHALATION_SOLUTION | Freq: Every day | RESPIRATORY_TRACT | Status: DC
  Administered 2022-12-24 – 2023-01-06 (×13): 15 ug via RESPIRATORY_TRACT
  Filled 2022-12-23 (×16): qty 2

## 2022-12-23 MED ORDER — COENZYME Q-10 200 MG PO CAPS: 200.0000 mg | ORAL_CAPSULE | Freq: Every day | ORAL | Status: DC

## 2022-12-23 MED ORDER — TORSEMIDE 20 MG PO TABS: 20.0000 mg | ORAL_TABLET | Freq: Every day | ORAL | Status: DC | PRN

## 2022-12-23 MED ORDER — BUSPIRONE HCL 10 MG PO TABS
15.0000 mg | ORAL_TABLET | Freq: Three times a day (TID) | ORAL | Status: DC
  Administered 2022-12-24 – 2023-01-06 (×43): 15 mg via ORAL
  Filled 2022-12-23 (×35): qty 2
  Filled 2022-12-23 (×2): qty 3
  Filled 2022-12-23 (×7): qty 2

## 2022-12-23 MED ORDER — EZETIMIBE 10 MG PO TABS
10.0000 mg | ORAL_TABLET | Freq: Every day | ORAL | Status: DC
  Administered 2022-12-24 – 2023-01-06 (×15): 10 mg via ORAL
  Filled 2022-12-23 (×16): qty 1

## 2022-12-23 MED ORDER — BUDESONIDE 0.5 MG/2ML IN SUSP
0.5000 mg | Freq: Two times a day (BID) | RESPIRATORY_TRACT | Status: DC
  Administered 2022-12-24 – 2023-01-07 (×28): 0.5 mg via RESPIRATORY_TRACT
  Filled 2022-12-23 (×30): qty 2

## 2022-12-23 MED ORDER — ONDANSETRON HCL 4 MG PO TABS: 4.0000 mg | ORAL_TABLET | Freq: Four times a day (QID) | ORAL | Status: DC | PRN

## 2022-12-23 MED ORDER — POTASSIUM CHLORIDE CRYS ER 10 MEQ PO TBCR
10.0000 meq | EXTENDED_RELEASE_TABLET | Freq: Every day | ORAL | Status: DC
  Administered 2022-12-24 – 2023-01-06 (×14): 10 meq via ORAL
  Filled 2022-12-23 (×14): qty 1

## 2022-12-23 MED ORDER — MAGNESIUM HYDROXIDE 400 MG/5ML PO SUSP: 30.0000 mL | Freq: Every day | ORAL | Status: DC | PRN

## 2022-12-23 MED ORDER — SODIUM CHLORIDE 0.9% FLUSH
10.0000 mL | Freq: Two times a day (BID) | INTRAVENOUS | Status: DC
  Administered 2022-12-24 – 2023-01-05 (×26): 10 mL via INTRAVENOUS

## 2022-12-23 MED ORDER — NITROGLYCERIN 0.4 MG SL SUBL: 0.4000 mg | SUBLINGUAL_TABLET | SUBLINGUAL | Status: DC | PRN

## 2022-12-23 MED ORDER — ISOSORBIDE MONONITRATE ER 60 MG PO TB24: 30.0000 mg | ORAL_TABLET | Freq: Every day | ORAL | Status: DC

## 2022-12-23 MED ORDER — METRONIDAZOLE 500 MG/100ML IV SOLN
500.0000 mg | Freq: Three times a day (TID) | INTRAVENOUS | Status: AC
  Administered 2022-12-23 – 2022-12-26 (×10): 500 mg via INTRAVENOUS
  Filled 2022-12-23 (×9): qty 100

## 2022-12-23 MED ORDER — PANTOPRAZOLE SODIUM 40 MG PO TBEC
40.0000 mg | DELAYED_RELEASE_TABLET | Freq: Every day | ORAL | Status: DC
  Administered 2022-12-24 – 2023-01-06 (×14): 40 mg via ORAL
  Filled 2022-12-23 (×14): qty 1

## 2022-12-23 MED ORDER — IOHEXOL 300 MG/ML  SOLN
100.0000 mL | Freq: Once | INTRAMUSCULAR | Status: AC | PRN
  Administered 2022-12-23: 100 mL via INTRAVENOUS

## 2022-12-23 MED ORDER — METRONIDAZOLE 500 MG/100ML IV SOLN
500.0000 mg | Freq: Once | INTRAVENOUS | Status: DC
  Filled 2022-12-23: qty 100

## 2022-12-23 MED ORDER — SODIUM CHLORIDE 0.9 % IV SOLN
2.0000 g | INTRAVENOUS | Status: AC
  Administered 2022-12-23 – 2022-12-26 (×4): 2 g via INTRAVENOUS
  Filled 2022-12-23 (×3): qty 20

## 2022-12-23 MED ORDER — ENOXAPARIN SODIUM 60 MG/0.6ML IJ SOSY
0.5000 mg/kg | PREFILLED_SYRINGE | INTRAMUSCULAR | Status: DC
  Administered 2022-12-26: 50 mg via SUBCUTANEOUS
  Filled 2022-12-23 (×4): qty 0.6

## 2022-12-23 MED ORDER — POLYETHYLENE GLYCOL 3350 17 G PO PACK
17.0000 g | PACK | Freq: Every day | ORAL | Status: DC
  Administered 2022-12-24 – 2022-12-27 (×4): 17 g via ORAL
  Filled 2022-12-23 (×4): qty 1

## 2022-12-23 MED ORDER — LIDOCAINE 5 % EX PTCH
1.0000 | MEDICATED_PATCH | CUTANEOUS | Status: DC
  Administered 2022-12-25 – 2023-01-07 (×10): 1 via TRANSDERMAL
  Filled 2022-12-23 (×16): qty 1

## 2022-12-23 MED ORDER — VITAMIN B-12 1000 MCG PO TABS
2000.0000 ug | ORAL_TABLET | Freq: Every day | ORAL | Status: DC
  Administered 2022-12-24 – 2023-01-02 (×10): 2000 ug via ORAL
  Filled 2022-12-23 (×10): qty 2

## 2022-12-23 MED ORDER — LORATADINE 10 MG PO TABS: 10.0000 mg | ORAL_TABLET | Freq: Every day | ORAL | Status: DC | PRN

## 2022-12-23 MED ORDER — ROSUVASTATIN CALCIUM 10 MG PO TABS
10.0000 mg | ORAL_TABLET | ORAL | Status: DC
  Administered 2022-12-25 – 2023-01-06 (×7): 10 mg via ORAL
  Filled 2022-12-23 (×8): qty 1

## 2022-12-23 MED ORDER — FLUTICASONE PROPIONATE 50 MCG/ACT NA SUSP
2.0000 | Freq: Every day | NASAL | Status: DC
  Administered 2022-12-24 – 2023-01-06 (×14): 2 via NASAL
  Filled 2022-12-23 (×3): qty 16

## 2022-12-23 MED ORDER — FENTANYL CITRATE PF 50 MCG/ML IJ SOSY: 50.0000 ug | PREFILLED_SYRINGE | Freq: Once | INTRAMUSCULAR | Status: DC

## 2022-12-23 NOTE — Assessment & Plan Note (Addendum)
-   This is secondary to recent fall. - It led to 50% height loss. - Neurosurgery consult will be obtained. - Dr. Katrinka Blazing was notified. - She will have a TLSO brace.

## 2022-12-23 NOTE — Assessment & Plan Note (Signed)
-   This should be covered with IV Rocephin. - We will follow blood and urine cultures.

## 2022-12-23 NOTE — Sepsis Progress Note (Addendum)
Elink monitoring for the code sepsis protocol.   Notified provider of need to order lactic acids and blood cultures.

## 2022-12-23 NOTE — ED Notes (Signed)
ED Provider at bedside. 

## 2022-12-23 NOTE — ED Provider Notes (Signed)
[facility] Provider Note    Event Date/Time   First MD Initiated Contact with Patient 12/23/22 1624     (approximate)   History   Abdominal Pain   HPI  Alexandria [name] is a 87 y.o. female past medical history significant for COPD, hypertension, hyperlipidemia, diabetes, CAD, peripheral vascular disease, HFpEF, hypothyroidism, depression, atrial fibrillation not on anticoagulation, CKD, obesity, presents to the emergency department with abdominal pain.  States that she has been having abdominal pain for the past 1 month.  Nothing improves or worsens the pain.  Has tried Tylenol without any improvement.  States that she has been having intermittent episodes of constipation with questionable diarrhea.  Was initially told she did not have C. difficile and then was told she did have C. difficile and tested positive and was started on p.o. vancomycin yesterday.  Endorses ongoing nausea but no episodes of vomiting.  No prior CT scan done in the past 1 month.  Denies any dysuria, urinary urgency or frequency.  Currently rates her pain as 8/10.     Physical Exam   Triage Vital Signs: ED Triage Vitals  Encounter Vitals Group     BP 12/23/22 1430 (!) 141/60     Systolic BP Percentile --      Diastolic BP Percentile --      Pulse Rate 12/23/22 1430 65     Resp 12/23/22 1430 18     Temp 12/23/22 1430 98.7 F (37.1 C)     Temp Source 12/23/22 1430 Oral     SpO2 12/23/22 1430 98 %     Weight 12/23/22 1431 219 lb (99.3 kg)     Height 12/23/22 1431 5\' 5"  (1.651 m)     Head Circumference --      Peak Flow --      Pain Score 12/23/22 1431 8     Pain Loc --      Pain Education --      Exclude from Growth Chart --     Most recent vital signs: Vitals:   12/23/22 1850 12/23/22 1900  BP:  112/89  Pulse:  (!) 55  Resp:  14  Temp: 97.9 F (36.6 C)   SpO2:  100%    Physical Exam Constitutional:      Appearance: She is well-developed. She is obese.  HENT:      Head: Atraumatic.  Eyes:     Conjunctiva/sclera: Conjunctivae normal.  Cardiovascular:     Rate and Rhythm: Regular rhythm.  Pulmonary:     Effort: No respiratory distress.     Comments: 2 L nasal cannula Abdominal:     General: There is no distension.     Tenderness: There is abdominal tenderness in the left lower quadrant.  Musculoskeletal:        General: Normal range of motion.     Cervical back: Normal range of motion.  Skin:    General: Skin is warm.     Capillary Refill: Capillary refill takes less than 2 seconds.  Neurological:     General: No focal deficit present.     Mental Status: She is alert. Mental status is at baseline.  Psychiatric:        Mood and Affect: Mood normal.     IMPRESSION / MDM / ASSESSMENT AND PLAN / ED COURSE  I reviewed the triage vital signs and the nursing notes.  Differential diagnosis including bowel obstruction, diverticulitis, malignancy, GI bleed, intra-abdominal abscess  EKG  Pietro Cassis, the attending physician, personally viewed and interpreted this ECG.   Rate: Normal  Rhythm: Normal sinus  Axis: Normal  Intervals: Normal  ST&T Change: None  No tachycardic or bradycardic dysrhythmias while on cardiac telemetry.  RADIOLOGY I independently reviewed imaging, my interpretation of imaging: CT abdomen and pelvis with contrast -no obvious bowel obstruction on my exam.  Questionable diverticulitis but no obvious abscess.  CT scan was read as L1 compression fracture with height loss.  Questionable diverticulitis.  LABS (all labs ordered are listed, but only abnormal results are displayed) Labs interpreted as -    Labs Reviewed  COMPREHENSIVE METABOLIC PANEL - Abnormal; Notable for the following components:      Result Value   Potassium 3.1 (*)    Glucose, Bld 165 (*)    BUN 24 (*)    Creatinine, Ser 1.39 (*)    Calcium 8.6 (*)    Total Protein 6.2 (*)    Albumin 3.2 (*)    GFR, Estimated 37 (*)    All other  components within normal limits  CBC - Abnormal; Notable for the following components:   RBC 2.99 (*)    Hemoglobin 9.5 (*)    HCT 31.4 (*)    MCV 105.0 (*)    All other components within normal limits  URINALYSIS, ROUTINE W REFLEX MICROSCOPIC - Abnormal; Notable for the following components:   Color, Urine YELLOW (*)    APPearance CLEAR (*)    Leukocytes,Ua LARGE (*)    Bacteria, UA RARE (*)    All other components within normal limits  URINE CULTURE  CULTURE, BLOOD (ROUTINE X 2)  CULTURE, BLOOD (ROUTINE X 2)  LIPASE, BLOOD  BASIC METABOLIC PANEL  CBC  LACTIC ACID, PLASMA  PROCALCITONIN     MDM    Patient found to have an L1 compression fracture.  Discussed the patient's case with neurosurgery Dr. Katrinka Blazing who recommended TLSO brace for comfort as needed and outpatient follow-up versus admission for pain control.  Findings concerning for acute diverticulitis and a urinary tract infection.  Given IV Rocephin and Flagyl.  Urine culture was obtained.  Does not meet criteria for sepsis, initially ordered blood cultures and lactic acid however after discussion with the hospitalist will hold on blood cultures or lactic acid.  Patient is currently getting p.o. vancomycin for positive C. difficile toxin at facility.  No episodes of diarrhea in the emergency department.  Patient admitted for L1 compression fracture with ongoing pain, acute diverticulitis and urinary tract infection with abdominal pain.   PROCEDURES:  Critical Care performed: No  Procedures  Patient's presentation is most consistent with acute presentation with potential threat to life or bodily function.   MEDICATIONS ORDERED IN ED: Medications  sodium chloride flush (NS) 0.9 % injection 10 mL (10 mLs Intravenous Not Given 12/23/22 2159)  amiodarone (PACERONE) tablet 200 mg (has no administration in time range)  bisoprolol (ZEBETA) tablet 2.5 mg (has no administration in time range)  ezetimibe (ZETIA) tablet 10  mg (has no administration in time range)  nitroGLYCERIN (NITROSTAT) SL tablet 0.4 mg (has no administration in time range)  isosorbide mononitrate (IMDUR) 24 hr tablet 30 mg (has no administration in time range)  rosuvastatin (CRESTOR) tablet 10 mg (has no administration in time range)  torsemide (DEMADEX) tablet 40 mg (has no administration in time range)  busPIRone (BUSPAR) tablet 15 mg (has no administration in time range)  FLUoxetine (PROZAC) capsule 20 mg (has no administration in time  range)  levothyroxine (SYNTHROID) tablet 125 mcg (has no administration in time range)  pantoprazole (PROTONIX) EC tablet 40 mg (has no administration in time range)  senna-docusate (Senokot-S) tablet 1 tablet (has no administration in time range)  polyethylene glycol (MIRALAX / GLYCOLAX) packet 17 g (has no administration in time range)  cyanocobalamin (VITAMIN B12) tablet 2,000 mcg (has no administration in time range)  ferrous sulfate tablet 325 mg (has no administration in time range)  cholecalciferol (VITAMIN D3) 25 MCG (1000 UNIT) tablet 2,000 Units (has no administration in time range)  potassium chloride (KLOR-CON M) CR tablet 10 mEq (has no administration in time range)  albuterol (PROVENTIL) (2.5 MG/3ML) 0.083% nebulizer solution 3 mL (has no administration in time range)  arformoterol (BROVANA) nebulizer solution 15 mcg (has no administration in time range)  budesonide (PULMICORT) nebulizer solution 0.5 mg (has no administration in time range)  fluticasone (FLONASE) 50 MCG/ACT nasal spray 2 spray (has no administration in time range)  guaiFENesin (MUCINEX) 12 hr tablet 1,200 mg (has no administration in time range)  loratadine (CLARITIN) tablet 10 mg (has no administration in time range)  revefenacin (YUPELRI) nebulizer solution 175 mcg (has no administration in time range)  lidocaine (LIDODERM) 5 % 1 patch (has no administration in time range)  enoxaparin (LOVENOX) injection 50 mg (50 mg  Subcutaneous Patient Refused/Not Given 12/23/22 2158)  lactated ringers infusion ( Intravenous New Bag/Given 12/23/22 2222)  acetaminophen (TYLENOL) tablet 650 mg (has no administration in time range)    Or  acetaminophen (TYLENOL) suppository 650 mg (has no administration in time range)  traZODone (DESYREL) tablet 25 mg (has no administration in time range)  magnesium hydroxide (MILK OF MAGNESIA) suspension 30 mL (has no administration in time range)  ondansetron (ZOFRAN) tablet 4 mg (has no administration in time range)    Or  ondansetron (ZOFRAN) injection 4 mg (has no administration in time range)  cefTRIAXone (ROCEPHIN) 2 g in sodium chloride 0.9 % 100 mL IVPB (0 g Intravenous Stopped 12/23/22 2225)  metroNIDAZOLE (FLAGYL) IVPB 500 mg (500 mg Intravenous New Bag/Given 12/23/22 2228)  torsemide (DEMADEX) tablet 20 mg (has no administration in time range)  iohexol (OMNIPAQUE) 300 MG/ML solution 100 mL (100 mLs Intravenous Contrast Given 12/23/22 1747)  fentaNYL (SUBLIMAZE) injection 25 mcg (25 mcg Intravenous Given 12/23/22 2156)  potassium chloride (KLOR-CON) packet 40 mEq (40 mEq Oral Given 12/23/22 2200)    FINAL CLINICAL IMPRESSION(S) / ED DIAGNOSES   Final diagnoses:  Acute cystitis without hematuria  Closed compression fracture of body of L1 vertebra (HCC)  Diverticulitis     Rx / DC Orders   ED Discharge Orders     None        Note:  This document was prepared using Dragon voice recognition software and may include unintentional dictation errors.   Corena Herter, MD 12/23/22 2249

## 2022-12-23 NOTE — Telephone Encounter (Signed)
Diane from [facility] [employer] returned the call. States pt is having severe lower abd pain. Discussed with Diane that if pt is having severe abd pain she should be seen in the ER. Diane acknowledged understanding but still states they need to schedule an appt for her to be seen. Pt scheduled to see Dr. Tomasa Rand 12/31/22 at 9:30am.

## 2022-12-23 NOTE — ED Notes (Addendum)
Family voiced concern for pt going back to snf and stated she would probably not wear the brace d/t discomfort. Provider notified.

## 2022-12-23 NOTE — Assessment & Plan Note (Signed)
-   We will continue Imdur and as needed sublingual nitroglycerin as well as beta-blocker therapy.

## 2022-12-23 NOTE — Assessment & Plan Note (Signed)
-  We will continue statin therapy as well as Zetia.

## 2022-12-23 NOTE — Assessment & Plan Note (Signed)
-   The patient be admitted to a medical observation bed. - Will continue antibiotic therapy with IV Rocephin and Flagyl. - Pain management will be provided. - She will be hydrated with IV lactated ringer.

## 2022-12-23 NOTE — ED Triage Notes (Signed)
Pt arrives via EMS ([facility]) from [facility]. Pt sts that she has been having abd pain with diarrhea.

## 2022-12-23 NOTE — H&P (Signed)
[place]   PATIENT NAME: Alexandria [name]    MR#:  [id number]  DATE OF BIRTH:  Mar 07, 1936  DATE OF ADMISSION:  01/01/2023  PRIMARY CARE PHYSICIAN: Joaquim Nam, MD   Patient is coming from: Home  REQUESTING/REFERRING PHYSICIAN:  Corena Herter, MD  CHIEF COMPLAINT:   Chief Complaint  Patient presents with   Abdominal Pain    HISTORY OF PRESENT ILLNESS:  Alexandria [name] is a 87 y.o. female with medical history significant for anxiety, aortic stenosis, COPD, coronary artery disease, type 2 diabetes mellitus, atrial fibrillation on Eliquis, GERD, hypertension, dyslipidemia, hypothyroidism and OSA as well as peripheral neuropathy, who presented to the emergency room with acute onset of abdominal pain graded 8/10 in severity, which has been going on for the last month without specific precipitating or relieving factors.  She has been taking Tylenol without improvement.  She admits to episodes of constipation with questionable diarrhea.  She has been having low back pain over the last couple weeks which has been worsening.  No paresthesias or focal muscle weakness.  She was told she had C. difficile and was started on p.o. vancomycin yesterday.  She endorses nausea without episodes of vomiting.  No fever or chills.  She admits to urinary frequency, urgency and dysuria with no hematuria or flank pain.  No cough or wheezing.  No dyspnea or cough or wheezing chest pain or palpitations.  ED Course: Upon presentation to the emergency room, BP was 141/60 with temperature 98.7 and pulse oximetry 98% on 2 L of O2 by nasal cannula.  Labs revealed Hypokalemia of 3.1 and a blood glucose 165, BUN 24 creatinine 1.39 with calcium 8.6 and albumin was 3.2 total protein 6.2.  CBC showed anemia close to baseline with macrocytosis without leukocytosis.  UA was positive for UTI.  Urine culture was sent. EKG as reviewed by me : EKG showed sinus rhythm with rate of 55 with poor R wave progression Imaging:  Abdominal pelvic CT scan revealed the following: 1. Acute compression fracture of the L1 vertebral body with approximately 40% vertebral body height loss, new from prior. 2. Extensive colonic diverticulosis. Potentially mildly inflamed diverticulum at the junction of the descending and sigmoid colon with mild fat stranding. Findings may represent mild acute diverticulitis. No additional convincing sites of diverticulitis. 3. Gastric folds appear diffusely thickened, similar to prior. Correlate for gastritis. 4. Previously described complex lesion within the upper pole left kidney appears to represent two small adjacent cysts upon today's exam. 5. Aortic atherosclerosis.  Dr. Katrinka Blazing was notified about the patient and recommended TLSO brace.  The patient was given 2 g of IV Rocephin and 500 mg of IV Flagyl as well as 25 mcg of IV fentanyl.  She will be admitted to the medical observation bed for further evaluation and management. PAST MEDICAL HISTORY:   Past Medical History:  Diagnosis Date   Allergy, unspecified not elsewhere classified    Anxiety    Aortic stenosis, mild 03/30/2022   a. TTE 03/29/2022: EF 55-60, no RWMA, GR 1 DD, normal RVSF, mild aortic stenosis (mean 10, V-max 212 cm/s, DI 0.70)   Cataract    Chronic diastolic congestive heart failure (HCC)    a. 06/2022 Echo: EF 60-65%, no rwma, nl RV fxn, mild MR, mild AS.   COPD (chronic obstructive pulmonary disease) (HCC) 03/08/1998   PFTs 12/12/2002 FEV 1 1.42 (64%) ratio 58 with no better after B2 and DLCO75%; PFTs 11/20/09 FEV1 1.50 (73%)  ratio 50 no better after B2 with DLCO 62%; Hfa 50% 11/20/2009 >75%, 01/13/10 p coaching   Coronary artery calcification seen on CT scan    a. 2012 Neg stress test.   Depression 12/07/1998   Diabetes mellitus type II 02/05/2006   Dr. Elvera Lennox with endo   Disorders of bursae and tendons in shoulder region, unspecified    Rotator cuff syndrome, right   E. coli bacteremia    Esophagitis    GERD  (gastroesophageal reflux disease)    History of UTI    Hyperlipidemia 10/04/2000   Hypertension 03/08/1992   Hypothyroidism 03/08/1968   Iron deficiency anemia    Lung nodule    radiation starts 10-08-2019   Malignant neoplasm of right upper lobe of lung (HCC) 07/24/2019   Obesity    NOS   OSA (obstructive sleep apnea)    PSG 01/27/10 AHI 13, pt does not know CPAP settings   PAF (paroxysmal atrial fibrillation) (HCC)    a. 06/2022 in setting of UTI/encephalopathy-->converted on amio; b. CHA2DS2VASc = 6-->No OAC 2/2 chronic anemia/fall risk; c. 06/2022 Zio: Predominantly sinus bradycardia at 54 (3-158)'s.  1 brief run of SVT.  2.2% PVC burden.   Peripheral neuropathy    Likely due to DM per Dr. Sandria Manly   Renal lesion 12/22/2022   Ventral hernia    PAST SURGICAL HISTORY:   Past Surgical History:  Procedure Laterality Date   ABD U/S  03/19/1999   Nml x2 foci in liver   ADENOSINE MYOVIEW  06/02/2007   Nml   CARDIOLITE PERSANTINE  08/24/2000   Nml   CAROTID U/S  08/24/2000   1-39% ICA stenosis   CAROTID U/S  06/02/2007   No apprec change    CARPAL TUNNEL RELEASE  12/1997   Right   CESAREAN SECTION     x2 [place]ree[place]/ repeat   CHOLECYSTECTOMY  1997   COLONOSCOPY WITH PROPOFOL N/A 02/12/2016   Procedure: COLONOSCOPY WITH PROPOFOL;  Surgeon: Rachael Fee, MD;  Location: [facility];  Service: Endoscopy;  Laterality: N/A;   CT ABD W & PELVIS WO/W CM     Abd hemangiomas of liver, 1 cm R renal cyst   DENTAL SURGERY  2016   Implants   DEXA  07/03/2003   Nml   ESOPHAGOGASTRODUODENOSCOPY  12/05/1997   Nml (due to hoarseness)   ESOPHAGOGASTRODUODENOSCOPY (EGD) WITH PROPOFOL N/A 02/12/2016   Procedure: ESOPHAGOGASTRODUODENOSCOPY (EGD) WITH PROPOFOL;  Surgeon: Rachael Fee, MD;  Location: [facility];  Service: Endoscopy;  Laterality: N/A;   GALLBLADDER SURGERY     HERNIA REPAIR  01/24/2009   Lap Ventr w/ Lysis of adhesions (Dr. Dwain Sarna)   knee arthroscopic surgery  years ago    right   ROTATOR CUFF REPAIR  1984   Right, [place]   SHOULDER OPEN ROTATOR CUFF REPAIR  02/08/2012   Procedure: ROTATOR CUFF REPAIR SHOULDER OPEN;  Surgeon: Drucilla Schmidt, MD;  Location: [facility];  Service: Orthopedics;  Laterality: Left;  Left Shoulder Open Anterior Acrominectomy Rotator Cuff Repair Open Distal Clavicle Resection ,tissue mend graft, and repair of biceps tendon   THUMB RELEASE  12/1997   Right   TONSILLECTOMY     TOTAL ABDOMINAL HYSTERECTOMY  1985   Due to dysmennorhea   US ECHOCARDIOGRAPHY  06/02/2007    SOCIAL HISTORY:   Social History   Tobacco Use   Smoking status: Former    Current packs/day: 0.00    Average packs/day: 2.0 packs/day for 50.0 years (100.0 ttl pk-yrs)  Types: Cigarettes    Start date: 02/06/1955    Quit date: 02/05/2005    Years since quitting: 17.8   Smokeless tobacco: Never  Substance Use Topics   Alcohol use: Yes    Comment: occasionally    FAMILY HISTORY:   Family History  Problem Relation Age of Onset   Heart disease Mother    Thyroid disease Mother    Emphysema Father        One lung   Cystic fibrosis Sister    Hyperthyroidism Sister    Osteoarthritis Brother    Hyperthyroidism Brother    Esophageal cancer Neg Hx    Cancer Neg Hx        Head or neck   Colon cancer Neg Hx    Stomach cancer Neg Hx    Breast cancer Neg Hx     DRUG ALLERGIES:   Allergies  Allergen Reactions   Antihistamines, Diphenhydramine-Type Other (See Comments)    Able to tolerate only allegra.    Incruse Ellipta [Umeclidinium Bromide] Cough    Caused voice change and severe coughing   Clarithromycin Other (See Comments)    REACTION: diff swallowing and mouth blisters   Codeine Other (See Comments)    Unknown    Diphenhydramine    Pregabalin Other (See Comments)    Lack of effect for neuropathy pain.   Sulfonamide Derivatives    Tiotropium Bromide Monohydrate Other (See Comments)    Voice changes   Umeclidinium    Vraylar  [Cariprazine]     intolerant    REVIEW OF SYSTEMS:   ROS As per history of present illness. All pertinent systems were reviewed above. Constitutional, HEENT, cardiovascular, respiratory, GI, GU, musculoskeletal, neuro, psychiatric, endocrine, integumentary and hematologic systems were reviewed and are otherwise negative/unremarkable except for positive findings mentioned above in the HPI.   MEDICATIONS AT HOME:   Prior to Admission medications   Medication Sig Start Date End Date Taking? Authorizing Provider  acetaminophen (TYLENOL) 325 MG tablet Take 2 tablets (650 mg total) by mouth every 6 (six) hours as needed for mild pain, headache, fever or moderate pain. 07/07/22   Gillis Santa, MD  albuterol (PROAIR HFA) 108 (90 Base) MCG/ACT inhaler Inhale 2 puffs into the lungs every 6 (six) hours as needed for wheezing or shortness of breath. INHALE 2 PUFFS INTO THE LUNGS EVERY 6 HOURS AS NEEDED FOR WHEEZING OR SHORTNESS OF BREATH 04/19/18   Coralyn Helling, MD  amiodarone (PACERONE) 200 MG tablet TAKE 1 TABLET BY MOUTH EVERY DAY 10/26/22   Creig Hines, NP  arformoterol (BROVANA) 15 MCG/2ML NEBU Take 2 mLs (15 mcg total) by nebulization at bedtime. 12/01/21   Coralyn Helling, MD  bisoprolol (ZEBETA) 5 MG tablet Take 0.5 tablets (2.5 mg total) by mouth daily. 08/06/22   Creig Hines, NP  budesonide (PULMICORT) 0.5 MG/2ML nebulizer solution Take 2 mLs (0.5 mg total) by nebulization in the morning and at bedtime. 10/22/22   Cobb, Ruby Cola, NP  busPIRone (BUSPAR) 15 MG tablet Take 15 mg by mouth 3 (three) times daily.    [provider]  Cholecalciferol (VITAMIN D) 50 MCG (2000 UT) tablet Take 1 tablet (2,000 Units total) by mouth daily. 06/27/18   Joaquim Nam, MD  Coenzyme Q-10 200 MG CAPS Take 200 mg by mouth daily.    [provider]  Cyanocobalamin (B-12) 5000 MCG CAPS Take 5,000 mcg by mouth daily.    [provider]  esomeprazole (NEXIUM) 40 MG  capsule TAKE 1 CAPSULE (40 MG TOTAL) BY MOUTH DAILY AT 12 NOON. 10/26/22   Jenel Lucks, MD  ezetimibe (ZETIA) 10 MG tablet TAKE 1 TABLET BY MOUTH EVERYDAY AT BEDTIME 10/27/22   Joaquim Nam, MD  ferrous sulfate 325 (65 FE) MG tablet Take 1 tablet (325 mg total) by mouth daily. 09/15/22   Briant Cedar, PA-C  FLUoxetine (PROZAC) 20 MG tablet Take 20 mg by mouth 2 (two) times daily.    [provider]  fluticasone (FLONASE) 50 MCG/ACT nasal spray Place 2 sprays into both nostrils daily. 11/27/22   Enedina Finner, MD  glucose blood (ONETOUCH VERIO) test strip USE AS INSTRUCTED TO CHECK SUGAR 1 TIME DAILY 03/16/22   Carlus Pavlov, MD  guaiFENesin (MUCINEX) 600 MG 12 hr tablet Take 2 tablets (1,200 mg total) by mouth 2 (two) times daily as needed for cough or to loosen phlegm. 08/25/21   Coralyn Helling, MD  isosorbide mononitrate (IMDUR) 30 MG 24 hr tablet Take 30 mg by mouth daily. 08/07/22   [provider]  levothyroxine (SYNTHROID) 125 MCG tablet Take 1 tablet (125 mcg total) by mouth daily. 09/14/22   Carlus Pavlov, MD  lidocaine (LIDODERM) 5 % Place 1 patch onto the skin daily. Remove & Discard patch within 12 hours or as directed by MD 12/21/22   Joaquim Nam, MD  loratadine (CLARITIN) 10 MG tablet Take 1 tablet (10 mg total) by mouth daily as needed for allergies. 11/26/22   Enedina Finner, MD  nitroGLYCERIN (NITROSTAT) 0.4 MG SL tablet Place 1 tablet (0.4 mg total) under the tongue every 5 (five) minutes as needed for chest pain. 07/16/21   Joaquim Nam, MD  NOVOLIN R 100 UNIT/ML injection Per sliding scale 12/06/22   [provider]  OneTouch Delica Lancets 33G MISC Use 1x a day 03/16/22   Carlus Pavlov, MD  OXYGEN Inhale into the lungs. 2lpm    [provider]  polyethylene glycol (MIRALAX / GLYCOLAX) 17 g packet Take 17 g by mouth daily. 11/27/22   Enedina Finner, MD  potassium chloride (KLOR-CON) 10 MEQ tablet TAKE 1 TABLET BY MOUTH EVERY DAY 10/26/22    Antonieta Iba, MD  revefenacin (YUPELRI) 175 MCG/3ML nebulizer solution Take 3 mLs (175 mcg total) by nebulization daily. 12/15/22   Hunsucker, Lesia Sago, MD  rosuvastatin (CRESTOR) 10 MG tablet TAKE 1 TABLET BY MOUTH EVERY OTHER DAY 07/30/22   Joaquim Nam, MD  senna-docusate (SENOKOT-S) 8.6-50 MG tablet Take 1 tablet by mouth at bedtime. 11/26/22   Enedina Finner, MD  Spacer/Aero Chamber Mouthpiece MISC Use with  inhaler as needed.  J44.9 09/30/17   Joaquim Nam, MD  torsemide (DEMADEX) 20 MG tablet Take 2 tablets (40 mg total) by mouth daily. Take an additional 20 mg daily as needed for weight greater then 230 pounds 08/24/22 12/21/22  Antonieta Iba, MD      VITAL SIGNS:  Blood pressure 112/89, pulse (!) 55, temperature 97.9 F (36.6 C), temperature source Oral, resp. rate 14, height 5\' 5"  (1.651 m), weight 99.3 kg, SpO2 100%.  PHYSICAL EXAMINATION:  Physical Exam  GENERAL:  87 y.o.-year-old patient lying in the bed with no acute distress.  EYES: Pupils equal, round, reactive to light and accommodation. No scleral icterus. Extraocular muscles intact.  HEENT: Head atraumatic, normocephalic. Oropharynx and nasopharynx clear.  NECK:  Supple, no jugular venous distention. No thyroid enlargement, no tenderness.  LUNGS: Normal breath sounds bilaterally, no wheezing, rales,rhonchi  or crepitation. No use of accessory muscles of respiration.  CARDIOVASCULAR: Regular rate and rhythm, S1, S2 normal. No murmurs, rubs, or gallops.  ABDOMEN: Soft, nondistended, with suprapubic tenderness without rebound tenderness guarding or rigidity. Bowel sounds present. No organomegaly or mass.  EXTREMITIES: 1+ bilateral lower extremity pitting edema with no cyanosis, or clubbing.  NEUROLOGIC: Cranial nerves II through XII are intact. Muscle strength 5/5 in all extremities. Sensation intact. Gait not checked.  PSYCHIATRIC: The patient is alert and oriented x 3.  Normal affect and good eye contact. SKIN:  No obvious rash, lesion, or ulcer.   LABORATORY PANEL:   CBC Recent Labs  Lab Jan 10, 2023 1431  WBC 6.5  HGB 9.5*  HCT 31.4*  PLT 166   ------------------------------------------------------------------------------------------------------------------  Chemistries  Recent Labs  Lab 2023/01/10 1431  NA 142  K 3.1*  CL 99  CO2 31  GLUCOSE 165*  BUN 24*  CREATININE 1.39*  CALCIUM 8.6*  AST 16  ALT 16  ALKPHOS 102  BILITOT 0.6   ------------------------------------------------------------------------------------------------------------------  Cardiac Enzymes No results for input(s): "TROPONINI" in the last 168 hours. ------------------------------------------------------------------------------------------------------------------  RADIOLOGY:  CT ABDOMEN PELVIS W CONTRAST  Result Date: 01-10-23 CLINICAL DATA:  Left lower quadrant abdominal pain EXAM: CT ABDOMEN AND PELVIS WITH CONTRAST TECHNIQUE: Multidetector CT imaging of the abdomen and pelvis was performed using the standard protocol following bolus administration of intravenous contrast. RADIATION DOSE REDUCTION: This exam was performed according to the departmental dose-optimization program which includes automated exposure control, adjustment of the mA and/or kV according to patient size and/or use of iterative reconstruction technique. CONTRAST:  OMNIPAQUE IOHEXOL 300 MG/ML  SOLN COMPARISON:  11/16/2022 FINDINGS: Lower chest: No acute abnormality. Aortic and coronary artery atherosclerosis. Calcification of the mitral annulus. Hepatobiliary: Unchanged hepatic hemangioma. No new focal liver abnormality. Prior cholecystectomy. No biliary dilatation. Pancreas: Unremarkable. No pancreatic ductal dilatation or surrounding inflammatory changes. Spleen: Normal in size without focal abnormality. Adrenals/Urinary Tract: Unremarkable adrenal glands. Bilateral renal cysts. Previously described complex lesion within the upper pole  left kidney appears to represent 2 small adjacent cysts upon today's exam (series 5, image 85). No renal stone or hydronephrosis. Urinary bladder within normal limits. Stomach/Bowel: Gastric fold appear diffusely thickened, similar to prior. No dilated loops of bowel. Extensive colonic diverticulosis. Potentially mildly inflamed diverticulum at the junction of the descending and sigmoid colon with mild fat stranding. No additional convincing sites of diverticulitis. Normal appendix in the right lower quadrant. Vascular/Lymphatic: Aortic atherosclerosis. No enlarged abdominal or pelvic lymph nodes. Reproductive: Status post hysterectomy. No adnexal masses. Other: No free fluid. No abdominopelvic fluid collection. No pneumoperitoneum. Prior ventral abdominal wall hernia repair with mesh. Musculoskeletal: Acute compression fracture of the L1 vertebral body involving both the superior and inferior endplates with approximately 40% vertebral body height loss, new from prior. Facet predominant lower lumbar spondylosis. IMPRESSION: 1. Acute compression fracture of the L1 vertebral body with approximately 40% vertebral body height loss, new from prior. 2. Extensive colonic diverticulosis. Potentially mildly inflamed diverticulum at the junction of the descending and sigmoid colon with mild fat stranding. Findings may represent mild acute diverticulitis. No additional convincing sites of diverticulitis. 3. Gastric folds appear diffusely thickened, similar to prior. Correlate for gastritis. 4. Previously described complex lesion within the upper pole left kidney appears to represent two small adjacent cysts upon today's exam. 5. Aortic atherosclerosis (ICD10-I70.0). Electronically Signed   By: Duanne Guess D.O.   On: 01/10/2023 19:24      IMPRESSION AND PLAN:  Assessment and Plan: * Acute diverticulitis - The patient be admitted to a medical observation bed. - Will continue antibiotic therapy with IV Rocephin and  Flagyl. - Pain management will be provided. - She will be hydrated with IV lactated ringer.  Acute lower UTI - This should be covered with IV Rocephin. - We will follow blood and urine cultures.  Closed compression fracture of body of L1 vertebra (HCC) - This is secondary to recent fall. - It led to 50% height loss. - Neurosurgery consult will be obtained. - Dr. Katrinka Blazing was notified. - She will have a TLSO brace.  Hypothyroidism - We will continue Synthroid.  Paroxysmal atrial fibrillation (HCC) - We will continue amiodarone and Eliquis.  Anxiety and depression - We will continue BuSpar and Prozac.  Dyslipidemia - We will continue statin therapy as well as Zetia.  GERD without esophagitis - We will continue Carafate and PPI therapy.  Coronary artery disease - We will continue Imdur and as needed sublingual nitroglycerin as well as beta-blocker therapy.  Chronic diastolic CHF (congestive heart failure) (HCC) - We will continue Demadex       DVT prophylaxis: Lovenox.  Advanced Care Planning:  Code Status: full code.  Family Communication:  The plan of care was discussed in details with the patient (and family). I answered all questions. The patient agreed to proceed with the above mentioned plan. Further management will depend upon hospital course. Disposition Plan: Back to previous home environment Consults called: Neurosurgery All the records are reviewed and case discussed with ED provider.  Status is: Observation  I certify that at the time of admission, it is my clinical judgment that the patient will require hospital care extending less than 2 midnights.                            Dispo: The patient is from: Home              Anticipated d/c is to: Home              Patient currently is not medically stable to d/c.              Difficult to place patient: No  Hannah Beat M.D on 2023/01/01 at 10:36 PM  [facility]   From 7 PM-7 AM, contact  night-coverage [email or web address]  CC: Primary care physician; Joaquim Nam, MD

## 2022-12-23 NOTE — Assessment & Plan Note (Signed)
-   We will continue BuSpar and Prozac.

## 2022-12-23 NOTE — ED Notes (Signed)
Pt assisted on bed pan. Pt still hurting with movement. Pt still refusing any pain management interventions.

## 2022-12-23 NOTE — Progress Notes (Signed)
CODE SEPSIS - PHARMACY COMMUNICATION  **Broad Spectrum Antibiotics should be administered within 1 hour of Sepsis diagnosis**  Time Code Sepsis Called/Page Received: 2053  Antibiotics Ordered: ceftriaxone  Time of 1st antibiotic administration: 2154  Additional action taken by pharmacy: messaged  If necessary, Name of Provider/Nurse Contacted: RN    Elliot Gurney, PharmD, BCPS Clinical Pharmacist  12/23/2022 8:47 PM

## 2022-12-23 NOTE — Assessment & Plan Note (Signed)
-   We will continue Demadex

## 2022-12-23 NOTE — Assessment & Plan Note (Signed)
-   We will continue Synthroid. 

## 2022-12-23 NOTE — ED Notes (Addendum)
RN and tech assisted pt on bedpan. Pt states when she moves she is in extreme pain, but moaned when moved. Pt offered the fentanyl that was order and pt declined. RN offered to ask MD for other pain medication such as Toradol or tylenol if she did not want an opioid. Pt refused all medication.

## 2022-12-23 NOTE — Assessment & Plan Note (Signed)
-  We will continue Carafate and PPI therapy. ?

## 2022-12-23 NOTE — Assessment & Plan Note (Signed)
-  We will continue amiodarone and Eliquis.

## 2022-12-24 ENCOUNTER — Encounter (HOSPITAL_COMMUNITY): Payer: Self-pay

## 2022-12-24 ENCOUNTER — Other Ambulatory Visit: Payer: Self-pay

## 2022-12-24 ENCOUNTER — Ambulatory Visit (HOSPITAL_COMMUNITY): Admission: RE | Admit: 2022-12-24 | Payer: Medicare Other | Source: Ambulatory Visit

## 2022-12-24 DIAGNOSIS — Z8616 Personal history of COVID-19: Secondary | ICD-10-CM | POA: Diagnosis not present

## 2022-12-24 DIAGNOSIS — J441 Chronic obstructive pulmonary disease with (acute) exacerbation: Secondary | ICD-10-CM | POA: Diagnosis not present

## 2022-12-24 DIAGNOSIS — Z7189 Other specified counseling: Secondary | ICD-10-CM | POA: Diagnosis not present

## 2022-12-24 DIAGNOSIS — E1151 Type 2 diabetes mellitus with diabetic peripheral angiopathy without gangrene: Secondary | ICD-10-CM | POA: Diagnosis present

## 2022-12-24 DIAGNOSIS — E1142 Type 2 diabetes mellitus with diabetic polyneuropathy: Secondary | ICD-10-CM | POA: Diagnosis present

## 2022-12-24 DIAGNOSIS — Z66 Do not resuscitate: Secondary | ICD-10-CM | POA: Diagnosis not present

## 2022-12-24 DIAGNOSIS — N1832 Chronic kidney disease, stage 3b: Secondary | ICD-10-CM | POA: Diagnosis present

## 2022-12-24 DIAGNOSIS — Z6836 Body mass index (BMI) 36.0-36.9, adult: Secondary | ICD-10-CM | POA: Diagnosis not present

## 2022-12-24 DIAGNOSIS — Z515 Encounter for palliative care: Secondary | ICD-10-CM | POA: Diagnosis not present

## 2022-12-24 DIAGNOSIS — G4733 Obstructive sleep apnea (adult) (pediatric): Secondary | ICD-10-CM | POA: Diagnosis not present

## 2022-12-24 DIAGNOSIS — I130 Hypertensive heart and chronic kidney disease with heart failure and stage 1 through stage 4 chronic kidney disease, or unspecified chronic kidney disease: Secondary | ICD-10-CM | POA: Diagnosis present

## 2022-12-24 DIAGNOSIS — J9811 Atelectasis: Secondary | ICD-10-CM | POA: Diagnosis not present

## 2022-12-24 DIAGNOSIS — M4856XA Collapsed vertebra, not elsewhere classified, lumbar region, initial encounter for fracture: Secondary | ICD-10-CM | POA: Diagnosis present

## 2022-12-24 DIAGNOSIS — J9622 Acute and chronic respiratory failure with hypercapnia: Secondary | ICD-10-CM | POA: Diagnosis not present

## 2022-12-24 DIAGNOSIS — D500 Iron deficiency anemia secondary to blood loss (chronic): Secondary | ICD-10-CM | POA: Diagnosis present

## 2022-12-24 DIAGNOSIS — I700 Atherosclerosis of aorta: Secondary | ICD-10-CM | POA: Diagnosis not present

## 2022-12-24 DIAGNOSIS — K5732 Diverticulitis of large intestine without perforation or abscess without bleeding: Secondary | ICD-10-CM | POA: Diagnosis present

## 2022-12-24 DIAGNOSIS — A0472 Enterocolitis due to Clostridium difficile, not specified as recurrent: Secondary | ICD-10-CM | POA: Diagnosis present

## 2022-12-24 DIAGNOSIS — M4846XA Fatigue fracture of vertebra, lumbar region, initial encounter for fracture: Secondary | ICD-10-CM

## 2022-12-24 DIAGNOSIS — K5792 Diverticulitis of intestine, part unspecified, without perforation or abscess without bleeding: Secondary | ICD-10-CM | POA: Diagnosis present

## 2022-12-24 DIAGNOSIS — S32010A Wedge compression fracture of first lumbar vertebra, initial encounter for closed fracture: Secondary | ICD-10-CM | POA: Diagnosis not present

## 2022-12-24 DIAGNOSIS — E8729 Other acidosis: Secondary | ICD-10-CM | POA: Diagnosis not present

## 2022-12-24 DIAGNOSIS — N3000 Acute cystitis without hematuria: Secondary | ICD-10-CM | POA: Diagnosis not present

## 2022-12-24 DIAGNOSIS — J9601 Acute respiratory failure with hypoxia: Secondary | ICD-10-CM | POA: Diagnosis not present

## 2022-12-24 DIAGNOSIS — I350 Nonrheumatic aortic (valve) stenosis: Secondary | ICD-10-CM | POA: Diagnosis present

## 2022-12-24 DIAGNOSIS — J9602 Acute respiratory failure with hypercapnia: Secondary | ICD-10-CM | POA: Diagnosis not present

## 2022-12-24 DIAGNOSIS — X58XXXA Exposure to other specified factors, initial encounter: Secondary | ICD-10-CM | POA: Diagnosis present

## 2022-12-24 DIAGNOSIS — I5032 Chronic diastolic (congestive) heart failure: Secondary | ICD-10-CM | POA: Diagnosis present

## 2022-12-24 DIAGNOSIS — G9349 Other encephalopathy: Secondary | ICD-10-CM | POA: Diagnosis not present

## 2022-12-24 DIAGNOSIS — E1122 Type 2 diabetes mellitus with diabetic chronic kidney disease: Secondary | ICD-10-CM | POA: Diagnosis present

## 2022-12-24 DIAGNOSIS — F32A Depression, unspecified: Secondary | ICD-10-CM | POA: Diagnosis present

## 2022-12-24 DIAGNOSIS — R918 Other nonspecific abnormal finding of lung field: Secondary | ICD-10-CM | POA: Diagnosis not present

## 2022-12-24 DIAGNOSIS — J9621 Acute and chronic respiratory failure with hypoxia: Secondary | ICD-10-CM | POA: Diagnosis not present

## 2022-12-24 DIAGNOSIS — E039 Hypothyroidism, unspecified: Secondary | ICD-10-CM | POA: Diagnosis present

## 2022-12-24 DIAGNOSIS — J449 Chronic obstructive pulmonary disease, unspecified: Secondary | ICD-10-CM | POA: Diagnosis present

## 2022-12-24 DIAGNOSIS — I480 Paroxysmal atrial fibrillation: Secondary | ICD-10-CM | POA: Diagnosis present

## 2022-12-24 DIAGNOSIS — R0602 Shortness of breath: Secondary | ICD-10-CM | POA: Diagnosis not present

## 2022-12-24 LAB — CBC
HCT: 33.8 % — ABNORMAL LOW (ref 36.0–46.0)
Hemoglobin: 10.3 g/dL — ABNORMAL LOW (ref 12.0–15.0)
MCH: 31.8 pg (ref 26.0–34.0)
MCHC: 30.5 g/dL (ref 30.0–36.0)
MCV: 104.3 fL — ABNORMAL HIGH (ref 80.0–100.0)
Platelets: 153 10*3/uL (ref 150–400)
RBC: 3.24 MIL/uL — ABNORMAL LOW (ref 3.87–5.11)
RDW: 15.5 % (ref 11.5–15.5)
WBC: 11.1 10*3/uL — ABNORMAL HIGH (ref 4.0–10.5)
nRBC: 0.2 % (ref 0.0–0.2)

## 2022-12-24 LAB — BASIC METABOLIC PANEL
Anion gap: 11 (ref 5–15)
BUN: 20 mg/dL (ref 8–23)
CO2: 30 mmol/L (ref 22–32)
Calcium: 8.4 mg/dL — ABNORMAL LOW (ref 8.9–10.3)
Chloride: 101 mmol/L (ref 98–111)
Creatinine, Ser: 1.33 mg/dL — ABNORMAL HIGH (ref 0.44–1.00)
GFR, Estimated: 39 mL/min — ABNORMAL LOW (ref >60)
Glucose, Bld: 132 mg/dL — ABNORMAL HIGH (ref 70–99)
Potassium: 3.5 mmol/L (ref 3.5–5.1)
Sodium: 142 mmol/L (ref 135–145)

## 2022-12-24 LAB — LACTIC ACID, PLASMA: Lactic Acid, Venous: 1.9 mmol/L (ref 0.5–1.9)

## 2022-12-24 NOTE — ED Notes (Signed)
Purewick placed at this time. Pt repositioned and pulled up in bed. Stretcher adjusted. Secretary informed to order hospital bed for pt.

## 2022-12-24 NOTE — ED Notes (Signed)
Still unable to get bc.Will notify lab.

## 2022-12-24 NOTE — ED Notes (Signed)
Report given to [facility]

## 2022-12-24 NOTE — Consult Note (Signed)
Consulting Department:  Emergency department  Primary Physician:  Joaquim Nam, MD  Chief Complaint: Compression fracture  History of Present Illness: 12/24/2022 Alexandria [name] is a 87 y.o. female who presents with the chief complaint of compression fracture.  She is here today for multiple issues.  She is being worked up for abdominal pain.  As part of this workup was found to have a compression fracture.  Not having any neurologic issues.  Her pain is mostly in the ventral abdomen rather than her back.  The symptoms are causing a significant impact on the patient's life.   Review of Systems:  A 10 point review of systems is negative, except for the pertinent positives and negatives detailed in the HPI.  Past Medical History: Past Medical History:  Diagnosis Date   Allergy, unspecified not elsewhere classified    Anxiety    Aortic stenosis, mild 03/30/2022   a. TTE 03/29/2022: EF 55-60, no RWMA, GR 1 DD, normal RVSF, mild aortic stenosis (mean 10, V-max 212 cm/s, DI 0.70)   Cataract    Chronic diastolic congestive heart failure (HCC)    a. 06/2022 Echo: EF 60-65%, no rwma, nl RV fxn, mild MR, mild AS.   COPD (chronic obstructive pulmonary disease) (HCC) 03/08/1998   PFTs 12/12/2002 FEV 1 1.42 (64%) ratio 58 with no better after B2 and DLCO75%; PFTs 11/20/09 FEV1 1.50 (73%) ratio 50 no better after B2 with DLCO 62%; Hfa 50% 11/20/2009 >75%, 01/13/10 p coaching   Coronary artery calcification seen on CT scan    a. 2012 Neg stress test.   Depression 12/07/1998   Diabetes mellitus type II 02/05/2006   Dr. Elvera Lennox with endo   Disorders of bursae and tendons in shoulder region, unspecified    Rotator cuff syndrome, right   E. coli bacteremia    Esophagitis    GERD (gastroesophageal reflux disease)    History of UTI    Hyperlipidemia 10/04/2000   Hypertension 03/08/1992   Hypothyroidism 03/08/1968   Iron deficiency anemia    Lung nodule    radiation starts 10-08-2019   Malignant  neoplasm of right upper lobe of lung (HCC) 07/24/2019   Obesity    NOS   OSA (obstructive sleep apnea)    PSG 01/27/10 AHI 13, pt does not know CPAP settings   PAF (paroxysmal atrial fibrillation) (HCC)    a. 06/2022 in setting of UTI/encephalopathy-->converted on amio; b. CHA2DS2VASc = 6-->No OAC 2/2 chronic anemia/fall risk; c. 06/2022 Zio: Predominantly sinus bradycardia at 54 (3-158)'s.  1 brief run of SVT.  2.2% PVC burden.   Peripheral neuropathy    Likely due to DM per Dr. Sandria Manly   Renal lesion 12/22/2022   Ventral hernia     Past Surgical History: Past Surgical History:  Procedure Laterality Date   ABD U/S  03/19/1999   Nml x2 foci in liver   ADENOSINE MYOVIEW  06/02/2007   Nml   CARDIOLITE PERSANTINE  08/24/2000   Nml   CAROTID U/S  08/24/2000   1-39% ICA stenosis   CAROTID U/S  06/02/2007   No apprec change    CARPAL TUNNEL RELEASE  12/1997   Right   CESAREAN SECTION     x2 Breech/ repeat   CHOLECYSTECTOMY  1997   COLONOSCOPY WITH PROPOFOL N/A 02/12/2016   Procedure: COLONOSCOPY WITH PROPOFOL;  Surgeon: Rachael Fee, MD;  Location: [facility];  Service: Endoscopy;  Laterality: N/A;   CT ABD W & PELVIS WO/W CM  Abd hemangiomas of liver, 1 cm R renal cyst   DENTAL SURGERY  2016   Implants   DEXA  07/03/2003   Nml   ESOPHAGOGASTRODUODENOSCOPY  12/05/1997   Nml (due to hoarseness)   ESOPHAGOGASTRODUODENOSCOPY (EGD) WITH PROPOFOL N/A 02/12/2016   Procedure: ESOPHAGOGASTRODUODENOSCOPY (EGD) WITH PROPOFOL;  Surgeon: Rachael Fee, MD;  Location: [facility];  Service: Endoscopy;  Laterality: N/A;   GALLBLADDER SURGERY     HERNIA REPAIR  01/24/2009   Lap Ventr w/ Lysis of adhesions (Dr. Dwain Sarna)   knee arthroscopic surgery  years ago   right   ROTATOR CUFF REPAIR  1984   Right, [place]   SHOULDER OPEN ROTATOR CUFF REPAIR  02/08/2012   Procedure: ROTATOR CUFF REPAIR SHOULDER OPEN;  Surgeon: Drucilla Schmidt, MD;  Location: [facility];  Service: Orthopedics;   Laterality: Left;  Left Shoulder Open Anterior Acrominectomy Rotator Cuff Repair Open Distal Clavicle Resection ,tissue mend graft, and repair of biceps tendon   THUMB RELEASE  12/1997   Right   TONSILLECTOMY     TOTAL ABDOMINAL HYSTERECTOMY  1985   Due to dysmennorhea   US ECHOCARDIOGRAPHY  06/02/2007    Allergies: Allergies as of 12/23/2022 - Review Complete 12/23/2022  Allergen Reaction Noted   Antihistamines, diphenhydramine-type Other (See Comments) 02/09/2016   Incruse ellipta [umeclidinium bromide] Cough 01/21/2016   Clarithromycin Other (See Comments) 05/23/2007   Codeine Other (See Comments) 07/12/2006   Diphenhydramine  07/07/2022   Pregabalin Other (See Comments) 10/19/2016   Sulfonamide derivatives  07/07/2022   Tiotropium bromide monohydrate Other (See Comments) 06/13/2014   Umeclidinium  07/07/2022   Vraylar [cariprazine]  02/06/2021    Medications:  Current Facility-Administered Medications:    acetaminophen (TYLENOL) tablet 650 mg, 650 mg, Oral, Q6H PRN **OR** acetaminophen (TYLENOL) suppository 650 mg, 650 mg, Rectal, Q6H PRN, Mansy, Jan A, MD   albuterol (PROVENTIL) (2.5 MG/3ML) 0.083% nebulizer solution 3 mL, 3 mL, Inhalation, Q6H PRN, Mansy, Jan A, MD   amiodarone (PACERONE) tablet 200 mg, 200 mg, Oral, Daily, Mansy, Jan A, MD   arformoterol (BROVANA) nebulizer solution 15 mcg, 15 mcg, Nebulization, QHS, Mansy, Jan A, MD, 15 mcg at 12/24/22 0021   bisoprolol (ZEBETA) tablet 2.5 mg, 2.5 mg, Oral, Daily, Mansy, Jan A, MD   budesonide (PULMICORT) nebulizer solution 0.5 mg, 0.5 mg, Nebulization, BID, Mansy, Jan A, MD, 0.5 mg at 12/24/22 0020   busPIRone (BUSPAR) tablet 15 mg, 15 mg, Oral, TID, Mansy, Jan A, MD, 15 mg at 12/24/22 0016   cefTRIAXone (ROCEPHIN) 2 g in sodium chloride 0.9 % 100 mL IVPB, 2 g, Intravenous, Q24H, Mansy, Jan A, MD, Stopped at 12/23/22 2225   cholecalciferol (VITAMIN D3) 25 MCG (1000 UNIT) tablet 2,000 Units, 2,000 Units, Oral, Daily, Mansy,  Jan A, MD   cyanocobalamin (VITAMIN B12) tablet 2,000 mcg, 2,000 mcg, Oral, Daily, Mansy, Jan A, MD   enoxaparin (LOVENOX) injection 50 mg, 0.5 mg/kg, Subcutaneous, Q24H, Mansy, Jan A, MD   ezetimibe (ZETIA) tablet 10 mg, 10 mg, Oral, QHS, Mansy, Jan A, MD, 10 mg at 12/24/22 0016   ferrous sulfate tablet 325 mg, 325 mg, Oral, Daily, Mansy, Jan A, MD   FLUoxetine (PROZAC) capsule 20 mg, 20 mg, Oral, BID, Mansy, Jan A, MD, 20 mg at 12/24/22 0015   fluticasone (FLONASE) 50 MCG/ACT nasal spray 2 spray, 2 spray, Each Nare, Daily, Mansy, Jan A, MD   guaiFENesin (MUCINEX) 12 hr tablet 1,200 mg, 1,200 mg, Oral, BID PRN, Mansy, Jan A,  MD   isosorbide mononitrate (IMDUR) 24 hr tablet 30 mg, 30 mg, Oral, Daily, Mansy, Vernetta Honey, MD   lactated ringers infusion, , Intravenous, Continuous, Mansy, Jan A, MD, Last Rate: 40 mL/hr at 12/23/22 2222, New Bag at 12/23/22 2222   levothyroxine (SYNTHROID) tablet 125 mcg, 125 mcg, Oral, Q0600, Mansy, Jan A, MD, 125 mcg at 12/24/22 3244   lidocaine (LIDODERM) 5 % 1 patch, 1 patch, Transdermal, Q24H, Mansy, Jan A, MD   loratadine (CLARITIN) tablet 10 mg, 10 mg, Oral, Daily PRN, Mansy, Jan A, MD   magnesium hydroxide (MILK OF MAGNESIA) suspension 30 mL, 30 mL, Oral, Daily PRN, Mansy, Jan A, MD   metroNIDAZOLE (FLAGYL) IVPB 500 mg, 500 mg, Intravenous, Q8H, Mansy, Jan A, MD, Last Rate: 100 mL/hr at 12/24/22 0613, 500 mg at 12/24/22 0102   nitroGLYCERIN (NITROSTAT) SL tablet 0.4 mg, 0.4 mg, Sublingual, Q5 min PRN, Mansy, Jan A, MD   ondansetron (ZOFRAN) tablet 4 mg, 4 mg, Oral, Q6H PRN **OR** ondansetron (ZOFRAN) injection 4 mg, 4 mg, Intravenous, Q6H PRN, Mansy, Jan A, MD   pantoprazole (PROTONIX) EC tablet 40 mg, 40 mg, Oral, Daily, Mansy, Jan A, MD   polyethylene glycol (MIRALAX / GLYCOLAX) packet 17 g, 17 g, Oral, Daily, Mansy, Jan A, MD   potassium chloride (KLOR-CON M) CR tablet 10 mEq, 10 mEq, Oral, Daily, Mansy, Jan A, MD   revefenacin (YUPELRI) nebulizer solution 175  mcg, 175 mcg, Nebulization, Daily, Mansy, Jan A, MD   [START ON 12/25/2022] rosuvastatin (CRESTOR) tablet 10 mg, 10 mg, Oral, QODAY, Mansy, Jan A, MD   senna-docusate (Senokot-S) tablet 1 tablet, 1 tablet, Oral, QHS, Mansy, Jan A, MD, 1 tablet at 12/24/22 0015   sodium chloride flush (NS) 0.9 % injection 10 mL, 10 mL, Intravenous, Q12H, Mumma, Shannon, MD   torsemide (DEMADEX) tablet 20 mg, 20 mg, Oral, Daily PRN, Otelia Sergeant, RPH   torsemide [facility]) tablet 40 mg, 40 mg, Oral, Daily, Mansy, Jan A, MD   traZODone (DESYREL) tablet 25 mg, 25 mg, Oral, QHS PRN, Mansy, Jan A, MD, 25 mg at 12/24/22 0016  Current Outpatient Medications:    amiodarone (PACERONE) 200 MG tablet, TAKE 1 TABLET BY MOUTH EVERY DAY, Disp: 90 tablet, Rfl: 0   arformoterol (BROVANA) 15 MCG/2ML NEBU, Take 2 mLs (15 mcg total) by nebulization at bedtime., Disp: 120 mL, Rfl: 6   bisoprolol (ZEBETA) 5 MG tablet, Take 0.5 tablets (2.5 mg total) by mouth daily., Disp: 45 tablet, Rfl: 1   budesonide (PULMICORT) 0.5 MG/2ML nebulizer solution, Take 2 mLs (0.5 mg total) by nebulization in the morning and at bedtime., Disp: 360 mL, Rfl: 3   busPIRone (BUSPAR) 15 MG tablet, Take 15 mg by mouth 3 (three) times daily., Disp: , Rfl:    Cholecalciferol (VITAMIN D) 50 MCG (2000 UT) tablet, Take 1 tablet (2,000 Units total) by mouth daily., Disp: , Rfl:    Coenzyme Q-10 200 MG CAPS, Take 200 mg by mouth daily., Disp: , Rfl:    Cyanocobalamin (B-12) 5000 MCG CAPS, Take 5,000 mcg by mouth daily., Disp: , Rfl:    esomeprazole (NEXIUM) 40 MG capsule, TAKE 1 CAPSULE (40 MG TOTAL) BY MOUTH DAILY AT 12 NOON., Disp: 90 capsule, Rfl: 3   ezetimibe (ZETIA) 10 MG tablet, TAKE 1 TABLET BY MOUTH EVERYDAY AT BEDTIME, Disp: 90 tablet, Rfl: 2   ferrous sulfate 325 (65 FE) MG tablet, Take 1 tablet (325 mg total) by mouth daily., Disp: 90 tablet, Rfl:  3   FLUoxetine (PROZAC) 20 MG tablet, Take 20 mg by mouth 2 (two) times daily., Disp: , Rfl:    fluticasone  (FLONASE) 50 MCG/ACT nasal spray, Place 2 sprays into both nostrils daily., Disp: 9.9 mL, Rfl: 2   guaiFENesin (MUCINEX) 600 MG 12 hr tablet, Take 2 tablets (1,200 mg total) by mouth 2 (two) times daily as needed for cough or to loosen phlegm., Disp: , Rfl:    isosorbide mononitrate (IMDUR) 30 MG 24 hr tablet, Take 30 mg by mouth daily., Disp: , Rfl:    levothyroxine (SYNTHROID) 125 MCG tablet, Take 1 tablet (125 mcg total) by mouth daily., Disp: 90 tablet, Rfl: 3   lidocaine (LIDODERM) 5 %, Place 1 patch onto the skin daily. Remove & Discard patch within 12 hours or as directed by MD, Disp: , Rfl:    NOVOLIN R 100 UNIT/ML injection, Per sliding scale, Disp: , Rfl:    nystatin (MYCOSTATIN) 100000 UNIT/ML suspension, Take 5 mLs by mouth 4 (four) times daily., Disp: , Rfl:    polyethylene glycol (MIRALAX / GLYCOLAX) 17 g packet, Take 17 g by mouth daily., Disp: 14 each, Rfl: 0   potassium chloride (KLOR-CON) 10 MEQ tablet, TAKE 1 TABLET BY MOUTH EVERY DAY, Disp: 90 tablet, Rfl: 3   revefenacin (YUPELRI) 175 MCG/3ML nebulizer solution, Take 3 mLs (175 mcg total) by nebulization daily., Disp: 90 mL, Rfl: 11   rosuvastatin (CRESTOR) 10 MG tablet, TAKE 1 TABLET BY MOUTH EVERY OTHER DAY, Disp: 45 tablet, Rfl: 2   senna-docusate (SENOKOT-S) 8.6-50 MG tablet, Take 1 tablet by mouth at bedtime., Disp: 30 tablet, Rfl: 0   torsemide (DEMADEX) 20 MG tablet, Take 2 tablets (40 mg total) by mouth daily. Take an additional 20 mg daily as needed for weight greater then 230 pounds, Disp: 270 tablet, Rfl: 1   acetaminophen (TYLENOL) 325 MG tablet, Take 2 tablets (650 mg total) by mouth every 6 (six) hours as needed for mild pain, headache, fever or moderate pain., Disp: , Rfl:    albuterol (PROAIR HFA) 108 (90 Base) MCG/ACT inhaler, Inhale 2 puffs into the lungs every 6 (six) hours as needed for wheezing or shortness of breath. INHALE 2 PUFFS INTO THE LUNGS EVERY 6 HOURS AS NEEDED FOR WHEEZING OR SHORTNESS OF BREATH,  Disp: 3 Inhaler, Rfl: 3   glucose blood (ONETOUCH VERIO) test strip, USE AS INSTRUCTED TO CHECK SUGAR 1 TIME DAILY, Disp: 100 strip, Rfl: 3   loratadine (CLARITIN) 10 MG tablet, Take 1 tablet (10 mg total) by mouth daily as needed for allergies., Disp: 30 tablet, Rfl: 0   nitroGLYCERIN (NITROSTAT) 0.4 MG SL tablet, Place 1 tablet (0.4 mg total) under the tongue every 5 (five) minutes as needed for chest pain., Disp: 25 tablet, Rfl: 3   OneTouch [employer]ica Lancets 33G MISC, Use 1x a day, Disp: 100 each, Rfl: 3   OXYGEN, Inhale into the lungs. 2lpm, Disp: , Rfl:    Spacer/[employer] MISC, Use with  inhaler as needed.  J44.9, Disp: 1 each, Rfl: 0  Facility-Administered Medications Ordered in Other Encounters:    0.9 %  sodium chloride infusion, , Intravenous, Once, Brahmanday, Govinda R, MD   0.9 %  sodium chloride infusion, , Intravenous, Continuous, Louretta Shorten R, MD, Last Rate: 20 mL/hr at 11/02/22 1421, New Bag at 11/02/22 1421   Social History: Social History   Tobacco Use   Smoking status: Former    Current packs/day: 0.00    Average  packs/day: 2.0 packs/day for 50.0 years (100.0 ttl pk-yrs)    Types: Cigarettes    Start date: 02/06/1955    Quit date: 02/05/2005    Years since quitting: 17.8   Smokeless tobacco: Never  Vaping Use   Vaping status: Never Used  Substance Use Topics   Alcohol use: Yes    Comment: occasionally   Drug use: Never    Family Medical History: Family History  Problem Relation Age of Onset   Heart disease Mother    Thyroid disease Mother    Emphysema Father        One lung   Cystic fibrosis Sister    Hyperthyroidism Sister    Osteoarthritis Brother    Hyperthyroidism Brother    Esophageal cancer Neg Hx    Cancer Neg Hx        Head or neck   Colon cancer Neg Hx    Stomach cancer Neg Hx    Breast cancer Neg Hx     Physical Examination: Vitals:   12/24/22 0707 12/24/22 0759  BP: (!) 104/38   Pulse: 69 75  Resp: (!) 23 18   Temp:    SpO2: 96% 99%     General: Patient is well developed, well nourished, calm, collected, and in no apparent distress.  NEUROLOGICAL:  General: In no acute distress.   Awake, alert, oriented to person, place, and time.  Pupils equal round and reactive to light.  Facial tone is symmetric.  Tongue protrusion is midline.  There is no pronator drift.  Spine is nontender  Strength: No new major motor deficits noted  No new sensory loss   Imaging: Narrative & Impression  CLINICAL DATA:  Left lower quadrant abdominal pain   EXAM: CT ABDOMEN AND PELVIS WITH CONTRAST   TECHNIQUE: Multidetector CT imaging of the abdomen and pelvis was performed using the standard protocol following bolus administration of intravenous contrast.   RADIATION DOSE REDUCTION: This exam was performed according to the departmental dose-optimization program which includes automated exposure control, adjustment of the mA and/or kV according to patient size and/or use of iterative reconstruction technique.   CONTRAST:  OMNIPAQUE IOHEXOL 300 MG/ML  SOLN   COMPARISON:  11/16/2022   FINDINGS: Lower chest: No acute abnormality. Aortic and coronary artery atherosclerosis. Calcification of the mitral annulus.   Hepatobiliary: Unchanged hepatic hemangioma. No new focal liver abnormality. Prior cholecystectomy. No biliary dilatation.   Pancreas: Unremarkable. No pancreatic ductal dilatation or surrounding inflammatory changes.   Spleen: Normal in size without focal abnormality.   Adrenals/Urinary Tract: Unremarkable adrenal glands. Bilateral renal cysts. Previously described complex lesion within the upper pole left kidney appears to represent 2 small adjacent cysts upon today's exam (series 5, image 85). No renal stone or hydronephrosis. Urinary bladder within normal limits.   Stomach/Bowel: Gastric fold appear diffusely thickened, similar to prior. No dilated loops of bowel. Extensive  colonic diverticulosis. Potentially mildly inflamed diverticulum at the junction of the descending and sigmoid colon with mild fat stranding. No additional convincing sites of diverticulitis. Normal appendix in the right lower quadrant.   Vascular/Lymphatic: Aortic atherosclerosis. No enlarged abdominal or pelvic lymph nodes.   Reproductive: Status post hysterectomy. No adnexal masses.   Other: No free fluid. No abdominopelvic fluid collection. No pneumoperitoneum. Prior ventral abdominal wall hernia repair with mesh.   Musculoskeletal: Acute compression fracture of the L1 vertebral body involving both the superior and inferior endplates with approximately 40% vertebral body height loss, new from prior. Facet predominant  lower lumbar spondylosis.   IMPRESSION: 1. Acute compression fracture of the L1 vertebral body with approximately 40% vertebral body height loss, new from prior. 2. Extensive colonic diverticulosis. Potentially mildly inflamed diverticulum at the junction of the descending and sigmoid colon with mild fat stranding. Findings may represent mild acute diverticulitis. No additional convincing sites of diverticulitis. 3. Gastric folds appear diffusely thickened, similar to prior. Correlate for gastritis. 4. Previously described complex lesion within the upper pole left kidney appears to represent two small adjacent cysts upon today's exam. 5. Aortic atherosclerosis (ICD10-I70.0).       I have personally reviewed the images and agree with the above interpretation.  Labs:    Latest Ref Rng & Units 12/24/2022    2:00 AM 12/23/2022    2:31 PM 12/16/2022    1:26 PM  CBC  WBC 4.0 - 10.5 K/uL 11.1  6.5  7.4   Hemoglobin 12.0 - 15.0 g/dL 81.1  9.5  9.7   Hematocrit 36.0 - 46.0 % 33.8  31.4  31.4   Platelets 150 - 400 K/uL 153  166  159        Assessment and Plan: Ms. Gruszka is a pleasant 87 y.o. female with \\a  current admission for abdominal pain, being  worked up for cystitis, as part of her workup had a abdominal CT scan which demonstrated compression fracture.  This does not appear to be symptomatic.  We did discuss the use of a TLSO brace for comfort.  She states that she has not interested in this therapy.  Should she develop any back pain then she can reconsider.  At this point no surgery is indicated.  She can continue to follow-up as needed.  Lovenia Kim, MD/MSCR Dept. of Neurosurgery

## 2022-12-24 NOTE — ED Notes (Signed)
Pt stuck multiple times for blood cultures. Will have another nurse attempt.

## 2022-12-24 NOTE — Progress Notes (Addendum)
Progress Note    Alexandria [name]  RKY:[id number] DOB: 10/15/1935  DOA: 2023/01/21 PCP: Joaquim Nam, MD      Brief Narrative:    Medical records reviewed and are as summarized below:  Alexandria [name] is a 87 y.o. female with medical history significant for anxiety, aortic stenosis, COPD, coronary artery disease, type 2 diabetes mellitus, atrial fibrillation on Eliquis, GERD, hypertension, dyslipidemia, hypothyroidism, OSA, CKD stage IIIb, peripheral neuropathy, obesity, who presented to the hospital with abdominal pain.  She said abdominal pain has been going on intermittently for about 3 to 4 weeks.  She has also been having low back pain for about a couple of weeks that has slowly worsened.  There was also report of diarrhea.  Patient however said stools were not watery but reportedly she had bloodstained stool about 3 days prior to admission.  Stools were mostly greenish in color.  There was concern of C. difficile infection and apparently tested positive so she was started on oral vancomycin.       Assessment/Plan:   Principal Problem:   Acute diverticulitis Active Problems:   Acute lower UTI   Closed compression fracture of body of L1 vertebra (HCC)   Hypothyroidism   Paroxysmal atrial fibrillation (HCC)   Chronic diastolic CHF (congestive heart failure) (HCC)   Coronary artery disease   GERD without esophagitis   Dyslipidemia   Anxiety and depression    Body mass index is 36.44 kg/m.  (Obesity)    Acute diverticulitis: Continue empiric IV ceftriaxone and Flagyl.  Analgesics as needed for pain. Gastric fold thickening on CT,?  Gastritis: Continue Protonix.   Reported C. difficile infection: Obtain stool for C. difficile testing.   Acute UTI: She is on IV ceftriaxone.  Follow-up urine cultures.    L1 compression fracture: Analgesics as needed for pain.  Patient declined L TSO brace.  She has been evaluated by neurosurgeon and no surgical intervention  required.       Chronic diastolic CHF: Compensated.  Discontinue IV fluids.  Hold torsemide because of low blood pressure.  Apparently blood pressure runs on the low side.             Other comorbidities include COPD, paroxysmal atrial fibrillation on Eliquis, CAD, GERD, hypothyroidism, anxiety, depression, dyslipidemia   Diet Order             Diet heart healthy/carb modified Room service appropriate? Yes; Fluid consistency: Thin  Diet effective now                            Consultants: Neurosurgeon  Procedures: None    Medications:    amiodarone  200 mg Oral Daily   arformoterol  15 mcg Nebulization QHS   budesonide  0.5 mg Nebulization BID   busPIRone  15 mg Oral TID   cholecalciferol  2,000 Units Oral Daily   cyanocobalamin  2,000 mcg Oral Daily   enoxaparin (LOVENOX) injection  0.5 mg/kg Subcutaneous Q24H   ezetimibe  10 mg Oral QHS   ferrous sulfate  325 mg Oral Daily   FLUoxetine  20 mg Oral BID   fluticasone  2 spray Each Nare Daily   levothyroxine  125 mcg Oral Q0600   lidocaine  1 patch Transdermal Q24H   pantoprazole  40 mg Oral Daily   polyethylene glycol  17 g Oral Daily   potassium chloride  10 mEq Oral Daily  revefenacin  175 mcg Nebulization Daily   [START ON 12/25/2022] rosuvastatin  10 mg Oral QODAY   senna-docusate  1 tablet Oral QHS   sodium chloride flush  10 mL Intravenous Q12H   Continuous Infusions:  cefTRIAXone (ROCEPHIN)  IV Stopped (01/05/2023 2225)   lactated ringers 40 mL/hr at 12/24/22 0853   metronidazole Stopped (12/24/22 0837)     Anti-infectives (From admission, onward)    Start     Dose/Rate Route Frequency Ordered Stop   01-05-2023 2115  cefTRIAXone (ROCEPHIN) 2 g in sodium chloride 0.9 % 100 mL IVPB        2 g 200 mL/hr over 30 Minutes Intravenous Every 24 hours 01/05/23 2106     2023/01/05 2115  metroNIDAZOLE (FLAGYL) IVPB 500 mg        500 mg 100 mL/hr over 60 Minutes Intravenous Every 8 hours 01-05-23  2106     2023-01-05 2045  cefTRIAXone (ROCEPHIN) 2 g in sodium chloride 0.9 % 100 mL IVPB  Status:  Discontinued        2 g 200 mL/hr over 30 Minutes Intravenous  Once 2023-01-05 2044 05-Jan-2023 2149   01-05-2023 2045  metroNIDAZOLE (FLAGYL) IVPB 500 mg  Status:  Discontinued        500 mg 100 mL/hr over 60 Minutes Intravenous  Once 01-05-23 2044 01/05/2023 2149              Family Communication/Anticipated D/C date and plan/Code Status   DVT prophylaxis:      Code Status: Full Code  Family Communication: Plan discussed with Suzie Portela, sister, at the bedside and [name], daughter, over the phone Disposition Plan: Plan to discharge to SNF   Status is: Observation The patient will require care spanning > 2 midnights and should be moved to inpatient because: On IV antibiotics for UTI and diverticulitis       Subjective:   Interval events noted.  She feels better today.  Mid abdominal pain has improved although it is still there.  She has some back pain but is not too bad.  Suzie Portela, sister, was at the bedside  Objective:    Vitals:   12/24/22 0707 12/24/22 0759 12/24/22 0837 12/24/22 0855  BP: (!) 104/38   (!) 101/40  Pulse: 69 75  68  Resp: (!) 23 18  20   Temp:   98.8 F (37.1 C) 98.8 F (37.1 C)  TempSrc:   Oral   SpO2: 96% 99%  100%  Weight:      Height:       No data found.   Intake/Output Summary (Last 24 hours) at 12/24/2022 1043 Last data filed at 12/24/2022 0514 Gross per 24 hour  Intake 100 ml  Output 300 ml  Net -200 ml   Filed Weights   January 05, 2023 1431  Weight: 99.3 kg    Exam:  GEN: NAD SKIN: Warm and dry EYES: No pallor or icterus ENT: MMM CV: RRR PULM: CTA B ABD: soft, obese, left lower quadrant tenderness without rebound tenderness or guarding, +BS CNS: AAO x 3, non focal EXT: No edema or tenderness        Data Reviewed:   I have personally reviewed following labs and imaging studies:  Labs: Labs show the following:   Basic  Metabolic Panel: Recent Labs  Lab 2023-01-05 1431 12/24/22 0200  NA 142 142  K 3.1* 3.5  CL 99 101  CO2 31 30  GLUCOSE 165* 132*  BUN 24* 20  CREATININE 1.39* 1.33*  CALCIUM 8.6* 8.4*   GFR Estimated Creatinine Clearance: 36.1 mL/min (A) (by C-G formula based on SCr of 1.33 mg/dL (H)). Liver Function Tests: Recent Labs  Lab 2022-12-27 1431  AST 16  ALT 16  ALKPHOS 102  BILITOT 0.6  PROT 6.2*  ALBUMIN 3.2*   Recent Labs  Lab 2022/12/27 1431  LIPASE 27   No results for input(s): "AMMONIA" in the last 168 hours. Coagulation profile No results for input(s): "INR", "PROTIME" in the last 168 hours.  CBC: Recent Labs  Lab 2022/12/27 1431 12/24/22 0200  WBC 6.5 11.1*  HGB 9.5* 10.3*  HCT 31.4* 33.8*  MCV 105.0* 104.3*  PLT 166 153   Cardiac Enzymes: No results for input(s): "CKTOTAL", "CKMB", "CKMBINDEX", "TROPONINI" in the last 168 hours. BNP (last 3 results) Recent Labs    04/22/22 1624  PROBNP 69.0   CBG: No results for input(s): "GLUCAP" in the last 168 hours. D-Dimer: No results for input(s): "DDIMER" in the last 72 hours. Hgb A1c: No results for input(s): "HGBA1C" in the last 72 hours. Lipid Profile: No results for input(s): "CHOL", "HDL", "LDLCALC", "TRIG", "CHOLHDL", "LDLDIRECT" in the last 72 hours. Thyroid function studies: No results for input(s): "TSH", "T4TOTAL", "T3FREE", "THYROIDAB" in the last 72 hours.  Invalid input(s): "FREET3" Anemia work up: No results for input(s): "VITAMINB12", "FOLATE", "FERRITIN", "TIBC", "IRON", "RETICCTPCT" in the last 72 hours. Sepsis Labs: Recent Labs  Lab Dec 27, 2022 1431 12/24/22 0200  PROCALCITON <0.10  --   WBC 6.5 11.1*  LATICACIDVEN  --  1.9    Microbiology Recent Results (from the past 240 hour(s))  Culture, blood (Routine X 2) w Reflex to ID Panel     Status: None (Preliminary result)   Collection Time: 12/24/22  2:00 AM   Specimen: BLOOD LEFT HAND  Result Value Ref Range Status   Specimen  Description BLOOD LEFT HAND  Final   Special Requests   Final    BOTTLES DRAWN AEROBIC ONLY Blood Culture results may not be optimal due to an inadequate volume of blood received in culture bottles   Culture   Final    NO GROWTH < 12 HOURS Performed at [facility], [address] [place], [place] [address]    Report Status PENDING  Incomplete  Culture, blood (Routine X 2) w Reflex to ID Panel     Status: None (Preliminary result)   Collection Time: 12/24/22  2:00 AM   Specimen: BLOOD LEFT HAND  Result Value Ref Range Status   Specimen Description BLOOD RIGHT HAND  Final   Special Requests   Final    BOTTLES DRAWN AEROBIC ONLY Blood Culture adequate volume   Culture   Final    NO GROWTH < 12 HOURS Performed at [facility], [address] [place], [place] [address]    Report Status PENDING  Incomplete    Procedures and diagnostic studies:  CT ABDOMEN PELVIS W CONTRAST  Result Date: 27-Dec-2022 CLINICAL DATA:  Left lower quadrant abdominal pain EXAM: CT ABDOMEN AND PELVIS WITH CONTRAST TECHNIQUE: Multidetector CT imaging of the abdomen and pelvis was performed using the standard protocol following bolus administration of intravenous contrast. RADIATION DOSE REDUCTION: This exam was performed according to the departmental dose-optimization program which includes automated exposure control, adjustment of the mA and/or kV according to patient size and/or use of iterative reconstruction technique. CONTRAST:  OMNIPAQUE IOHEXOL 300 MG/ML  SOLN COMPARISON:  11/16/2022 FINDINGS: Lower chest: No acute abnormality. Aortic and coronary artery atherosclerosis. Calcification  of the mitral annulus. Hepatobiliary: Unchanged hepatic hemangioma. No new focal liver abnormality. Prior cholecystectomy. No biliary dilatation. Pancreas: Unremarkable. No pancreatic ductal dilatation or surrounding inflammatory changes. Spleen: Normal in size without focal abnormality. Adrenals/Urinary  Tract: Unremarkable adrenal glands. Bilateral renal cysts. Previously described complex lesion within the upper pole left kidney appears to represent 2 small adjacent cysts upon today's exam (series 5, image 85). No renal stone or hydronephrosis. Urinary bladder within normal limits. Stomach/Bowel: Gastric fold appear diffusely thickened, similar to prior. No dilated loops of bowel. Extensive colonic diverticulosis. Potentially mildly inflamed diverticulum at the junction of the descending and sigmoid colon with mild fat stranding. No additional convincing sites of diverticulitis. Normal appendix in the right lower quadrant. Vascular/Lymphatic: Aortic atherosclerosis. No enlarged abdominal or pelvic lymph nodes. Reproductive: Status post hysterectomy. No adnexal masses. Other: No free fluid. No abdominopelvic fluid collection. No pneumoperitoneum. Prior ventral abdominal wall hernia repair with mesh. Musculoskeletal: Acute compression fracture of the L1 vertebral body involving both the superior and inferior endplates with approximately 40% vertebral body height loss, new from prior. Facet predominant lower lumbar spondylosis. IMPRESSION: 1. Acute compression fracture of the L1 vertebral body with approximately 40% vertebral body height loss, new from prior. 2. Extensive colonic diverticulosis. Potentially mildly inflamed diverticulum at the junction of the descending and sigmoid colon with mild fat stranding. Findings may represent mild acute diverticulitis. No additional convincing sites of diverticulitis. 3. Gastric folds appear diffusely thickened, similar to prior. Correlate for gastritis. 4. Previously described complex lesion within the upper pole left kidney appears to represent two small adjacent cysts upon today's exam. 5. Aortic atherosclerosis (ICD10-I70.0). Electronically Signed   By: Duanne Guess D.O.   On: Dec 25, 2022 19:24               LOS: 1 day   Crawford Tamura  [facility]    Pager on [email or web address]. If 7PM-7AM, please contact night-coverage at [email or web address]     12/24/2022, 10:43 AM

## 2022-12-24 NOTE — ED Notes (Signed)
Pt placed in hospital bed and repositioned. Pt given warm blankets and placed back on monitor. Pt hooked back to fluids. Pt breakfast tray set up for pt.

## 2022-12-24 NOTE — ED Notes (Signed)
Awaiting lab for bc.

## 2022-12-24 NOTE — Plan of Care (Signed)
[facility] Tonsillectomy/Adenoidectomy, Postoperative PEDS care plan entered in error.

## 2022-12-25 DIAGNOSIS — K5792 Diverticulitis of intestine, part unspecified, without perforation or abscess without bleeding: Secondary | ICD-10-CM | POA: Diagnosis not present

## 2022-12-25 DIAGNOSIS — A0472 Enterocolitis due to Clostridium difficile, not specified as recurrent: Secondary | ICD-10-CM | POA: Diagnosis present

## 2022-12-25 LAB — URINE CULTURE

## 2022-12-25 MED ORDER — VANCOMYCIN HCL 125 MG PO CAPS
125.0000 mg | ORAL_CAPSULE | Freq: Four times a day (QID) | ORAL | Status: AC
  Administered 2022-12-25 – 2023-01-03 (×40): 125 mg via ORAL
  Filled 2022-12-25 (×43): qty 1

## 2022-12-25 MED ORDER — LORAZEPAM 0.5 MG PO TABS
0.5000 mg | ORAL_TABLET | ORAL | Status: DC | PRN
  Administered 2022-12-25 – 2023-01-07 (×14): 0.5 mg via ORAL
  Filled 2022-12-25 (×15): qty 1

## 2022-12-25 MED ORDER — MORPHINE SULFATE (PF) 2 MG/ML IV SOLN
2.0000 mg | INTRAVENOUS | Status: DC | PRN
  Filled 2022-12-25 (×2): qty 1

## 2022-12-25 NOTE — Plan of Care (Signed)
Patient has done well today.  Cdiff results came from SNF and being treated. Ambulated in room a couple times.

## 2022-12-25 NOTE — Progress Notes (Addendum)
Progress Note    Alexandria [name]  WUJ:[id number] DOB: [birth year]  DOA: 01/03/2023 PCP: Joaquim Nam, MD      Brief Narrative:    Medical records reviewed and are as summarized below:  Alexandria [name] is a 87 y.o. female with medical history significant for anxiety, aortic stenosis, COPD, coronary artery disease, type 2 diabetes mellitus, atrial fibrillation on Eliquis, GERD, hypertension, dyslipidemia, hypothyroidism, OSA, CKD stage IIIb, peripheral neuropathy, obesity, who presented to the hospital with abdominal pain.  She said abdominal pain has been going on intermittently for about 3 to 4 weeks.  She has also been having low back pain for about a couple of weeks that has slowly worsened.  There was also report of diarrhea.  Patient however said stools were not watery but reportedly she had bloodstained stool about 3 days prior to admission.  Stools were mostly greenish in color.  There was concern of C. difficile infection and apparently tested positive so she was started on oral vancomycin.       Assessment/Plan:   Principal Problem:   Acute diverticulitis Active Problems:   Acute lower UTI   Closed compression fracture of body of L1 vertebra (HCC)   Hypothyroidism   Paroxysmal atrial fibrillation (HCC)   Chronic diastolic CHF (congestive heart failure) (HCC)   Coronary artery disease   GERD without esophagitis   Dyslipidemia   Anxiety and depression   C. difficile colitis    Body mass index is 36.44 kg/m.  (Obesity)    Acute diverticulitis: Continue IV ceftriaxone and metronidazole.  Analgesics as needed for pain. Gastric fold thickening on CT,?  Gastritis: Continue Protonix.   C. difficile infection: No diarrhea so stool for C. difficile testing could not be done in the hospital.  Patient had a positive C. difficile toxin test on 12/22/2022.  Copy of report was faxed to the unit. Stool for occult blood, lactoferrin and C.diff molecular (toxin B) were  positive.  Reportedly, patient was started on oral vancomycin prior to coming to the hospital.  Continue oral vancomycin.   Acute UTI ruled out: Urine culture showed multiple species.   L1 compression fracture: Analgesics as needed for pain.  Patient declined L TSO brace.  She has been evaluated by neurosurgeon and no surgical intervention required.       Chronic diastolic CHF: Compensated.  Discontinue IV fluids.  Hold torsemide because of low blood pressure.  Apparently blood pressure runs on the low side.             Other comorbidities include COPD, paroxysmal atrial fibrillation on Eliquis, CAD, GERD, hypothyroidism, anxiety, depression, dyslipidemia   Plan discussed with Suzie Portela (sister) at the bedside and [name] (daughter) over the phone. Plan discussed with Tinnie Gens, RN, at the bedside    Diet Order             Diet heart healthy/carb modified Room service appropriate? Yes; Fluid consistency: Thin  Diet effective now                            Consultants: Neurosurgeon  Procedures: None    Medications:    amiodarone  200 mg Oral Daily   arformoterol  15 mcg Nebulization QHS   budesonide  0.5 mg Nebulization BID   busPIRone  15 mg Oral TID   cholecalciferol  2,000 Units Oral Daily   cyanocobalamin  2,000 mcg Oral Daily   enoxaparin (LOVENOX)  injection  0.5 mg/kg Subcutaneous Q24H   ezetimibe  10 mg Oral QHS   ferrous sulfate  325 mg Oral Daily   FLUoxetine  20 mg Oral BID   fluticasone  2 spray Each Nare Daily   levothyroxine  125 mcg Oral Q0600   lidocaine  1 patch Transdermal Q24H   pantoprazole  40 mg Oral Daily   polyethylene glycol  17 g Oral Daily   potassium chloride  10 mEq Oral Daily   revefenacin  175 mcg Nebulization Daily   rosuvastatin  10 mg Oral QODAY   senna-docusate  1 tablet Oral QHS   sodium chloride flush  10 mL Intravenous Q12H   vancomycin  125 mg Oral QID   Continuous Infusions:  cefTRIAXone (ROCEPHIN)  IV 2 g  (12/24/22 2106)   metronidazole 500 mg (12/25/22 1319)     Anti-infectives (From admission, onward)    Start     Dose/Rate Route Frequency Ordered Stop   12/25/22 1100  vancomycin (VANCOCIN) capsule 125 mg        125 mg Oral 4 times daily 12/25/22 1005 01/04/23 0959   12/21/2022 2115  cefTRIAXone (ROCEPHIN) 2 g in sodium chloride 0.9 % 100 mL IVPB        2 g 200 mL/hr over 30 Minutes Intravenous Every 24 hours 01/03/2023 2106     01/04/2023 2115  metroNIDAZOLE (FLAGYL) IVPB 500 mg        500 mg 100 mL/hr over 60 Minutes Intravenous Every 8 hours 12/17/2022 2106     12/29/2022 2045  cefTRIAXone (ROCEPHIN) 2 g in sodium chloride 0.9 % 100 mL IVPB  Status:  Discontinued        2 g 200 mL/hr over 30 Minutes Intravenous  Once 12/31/2022 2044 12/26/2022 2149   12/07/2022 2045  metroNIDAZOLE (FLAGYL) IVPB 500 mg  Status:  Discontinued        500 mg 100 mL/hr over 60 Minutes Intravenous  Once 12/13/2022 2044 01/01/2023 2149              Family Communication/Anticipated D/C date and plan/Code Status   DVT prophylaxis:      Code Status: Full Code  Family Communication: Plan discussed with Suzie Portela, sister, at the bedside and [name], daughter, over the phone Disposition Plan: Plan to discharge to SNF  Status is: Inpatient Remains inpatient appropriate because: On IV antibiotics        Subjective:   Interval events noted.  She feels better.  Abdominal pain has improved.  No diarrhea. Suzie Portela, sister, was at the bedside.  Tinnie Gens, RN, was at the bedside  Objective:    Vitals:   12/24/22 1523 12/25/22 0005 12/25/22 0750 12/25/22 1526  BP: 92/77 (!) 110/28 (!) 142/41 (!) 106/24  Pulse: 66 62 (!) 58 61  Resp: 18 18 18 18   Temp: 98 F (36.7 C) 97.8 F (36.6 C) 97.9 F (36.6 C) 97.7 F (36.5 C)  TempSrc: Oral  Oral Oral  SpO2: 95% 97% 95% 95%  Weight:      Height:       No data found.   Intake/Output Summary (Last 24 hours) at 12/25/2022 1717 Last data filed at 12/25/2022  0610 Gross per 24 hour  Intake --  Output 600 ml  Net -600 ml   Filed Weights   12/07/2022 1431  Weight: 99.3 kg    Exam:  GEN: NAD SKIN: Warm and dry EYES: No pallor or icterus ENT: MMM CV: RRR PULM: CTA B ABD:  soft, obese, NT, +BS CNS: AAO x 3, non focal EXT: No edema or tenderness       Data Reviewed:   I have personally reviewed following labs and imaging studies:  Labs: Labs show the following:   Basic Metabolic Panel: Recent Labs  Lab 01/09/2023 1431 12/24/22 0200  NA 142 142  K 3.1* 3.5  CL 99 101  CO2 31 30  GLUCOSE 165* 132*  BUN 24* 20  CREATININE 1.39* 1.33*  CALCIUM 8.6* 8.4*   GFR Estimated Creatinine Clearance: 36.1 mL/min (A) (by C-G formula based on SCr of 1.33 mg/dL (H)). Liver Function Tests: Recent Labs  Lab Jan 09, 2023 1431  AST 16  ALT 16  ALKPHOS 102  BILITOT 0.6  PROT 6.2*  ALBUMIN 3.2*   Recent Labs  Lab 01-09-23 1431  LIPASE 27   No results for input(s): "AMMONIA" in the last 168 hours. Coagulation profile No results for input(s): "INR", "PROTIME" in the last 168 hours.  CBC: Recent Labs  Lab 01/09/23 1431 12/24/22 0200  WBC 6.5 11.1*  HGB 9.5* 10.3*  HCT 31.4* 33.8*  MCV 105.0* 104.3*  PLT 166 153   Cardiac Enzymes: No results for input(s): "CKTOTAL", "CKMB", "CKMBINDEX", "TROPONINI" in the last 168 hours. BNP (last 3 results) Recent Labs    04/22/22 1624  PROBNP 69.0   CBG: No results for input(s): "GLUCAP" in the last 168 hours. D-Dimer: No results for input(s): "DDIMER" in the last 72 hours. Hgb A1c: No results for input(s): "HGBA1C" in the last 72 hours. Lipid Profile: No results for input(s): "CHOL", "HDL", "LDLCALC", "TRIG", "CHOLHDL", "LDLDIRECT" in the last 72 hours. Thyroid function studies: No results for input(s): "TSH", "T4TOTAL", "T3FREE", "THYROIDAB" in the last 72 hours.  Invalid input(s): "FREET3" Anemia work up: No results for input(s): "VITAMINB12", "FOLATE", "FERRITIN", "TIBC",  "IRON", "RETICCTPCT" in the last 72 hours. Sepsis Labs: Recent Labs  Lab 2023/01/09 1431 12/24/22 0200  PROCALCITON <0.10  --   WBC 6.5 11.1*  LATICACIDVEN  --  1.9    Microbiology Recent Results (from the past 240 hour(s))  Urine Culture (for pregnant, neutropenic or urologic patients or patients with an indwelling urinary catheter)     Status: Abnormal   Collection Time: 01/09/2023  7:48 PM   Specimen: Urine, Clean Catch  Result Value Ref Range Status   Specimen Description   Final    URINE, CLEAN CATCH Performed at [facility], [address] [place], [place] [address]    Special Requests   Final    NONE Performed at [facility], [name], [place], [place] [address]    Culture MULTIPLE SPECIES PRESENT, SUGGEST RECOLLECTION (A)  Final   Report Status 12/25/2022 FINAL  Final  Culture, blood (Routine X 2) w Reflex to ID Panel     Status: None (Preliminary result)   Collection Time: 12/24/22  2:00 AM   Specimen: BLOOD LEFT HAND  Result Value Ref Range Status   Specimen Description BLOOD LEFT HAND  Final   Special Requests   Final    BOTTLES DRAWN AEROBIC ONLY Blood Culture results may not be optimal due to an inadequate volume of blood received in culture bottles   Culture   Final    NO GROWTH 1 DAY Performed at [facility], [address] [place], [place] [address]    Report Status PENDING  Incomplete  Culture, blood (Routine X 2) w Reflex to ID Panel     Status: None (Preliminary result)  Collection Time: 12/24/22  2:00 AM   Specimen: BLOOD RIGHT HAND  Result Value Ref Range Status   Specimen Description BLOOD RIGHT HAND  Final   Special Requests   Final    BOTTLES DRAWN AEROBIC ONLY Blood Culture adequate volume   Culture   Final    NO GROWTH 1 DAY Performed at [facility], [address] [place], [place] [address]    Report Status PENDING  Incomplete    Procedures and diagnostic studies:  CT ABDOMEN PELVIS W  CONTRAST  Result Date: 12/22/2022 CLINICAL DATA:  Left lower quadrant abdominal pain EXAM: CT ABDOMEN AND PELVIS WITH CONTRAST TECHNIQUE: Multidetector CT imaging of the abdomen and pelvis was performed using the standard protocol following bolus administration of intravenous contrast. RADIATION DOSE REDUCTION: This exam was performed according to the departmental dose-optimization program which includes automated exposure control, adjustment of the mA and/or kV according to patient size and/or use of iterative reconstruction technique. CONTRAST:  OMNIPAQUE IOHEXOL 300 MG/ML  SOLN COMPARISON:  11/16/2022 FINDINGS: Lower chest: No acute abnormality. Aortic and coronary artery atherosclerosis. Calcification of the mitral annulus. Hepatobiliary: Unchanged hepatic hemangioma. No new focal liver abnormality. Prior cholecystectomy. No biliary dilatation. Pancreas: Unremarkable. No pancreatic ductal dilatation or surrounding inflammatory changes. Spleen: Normal in size without focal abnormality. Adrenals/Urinary Tract: Unremarkable adrenal glands. Bilateral renal cysts. Previously described complex lesion within the upper pole left kidney appears to represent 2 small adjacent cysts upon today's exam (series 5, image 85). No renal stone or hydronephrosis. Urinary bladder within normal limits. Stomach/Bowel: Gastric fold appear diffusely thickened, similar to prior. No dilated loops of bowel. Extensive colonic diverticulosis. Potentially mildly inflamed diverticulum at the junction of the descending and sigmoid colon with mild fat stranding. No additional convincing sites of diverticulitis. Normal appendix in the right lower quadrant. Vascular/Lymphatic: Aortic atherosclerosis. No enlarged abdominal or pelvic lymph nodes. Reproductive: Status post hysterectomy. No adnexal masses. Other: No free fluid. No abdominopelvic fluid collection. No pneumoperitoneum. Prior ventral abdominal wall hernia repair with mesh.  Musculoskeletal: Acute compression fracture of the L1 vertebral body involving both the superior and inferior endplates with approximately 40% vertebral body height loss, new from prior. Facet predominant lower lumbar spondylosis. IMPRESSION: 1. Acute compression fracture of the L1 vertebral body with approximately 40% vertebral body height loss, new from prior. 2. Extensive colonic diverticulosis. Potentially mildly inflamed diverticulum at the junction of the descending and sigmoid colon with mild fat stranding. Findings may represent mild acute diverticulitis. No additional convincing sites of diverticulitis. 3. Gastric folds appear diffusely thickened, similar to prior. Correlate for gastritis. 4. Previously described complex lesion within the upper pole left kidney appears to represent two small adjacent cysts upon today's exam. 5. Aortic atherosclerosis (ICD10-I70.0). Electronically Signed   By: Duanne Guess D.O.   On: 01/06/2023 19:24               LOS: 2 days   Jazlynn Nemetz  [facility]   Pager on [email or web address]. If 7PM-7AM, please contact night-coverage at [email or web address]     12/25/2022, 5:17 PM

## 2022-12-26 DIAGNOSIS — K5792 Diverticulitis of intestine, part unspecified, without perforation or abscess without bleeding: Secondary | ICD-10-CM | POA: Diagnosis not present

## 2022-12-26 MED ORDER — ACETAMINOPHEN 325 MG PO TABS
650.0000 mg | ORAL_TABLET | Freq: Three times a day (TID) | ORAL | Status: AC
  Administered 2022-12-26 – 2022-12-27 (×6): 650 mg via ORAL
  Filled 2022-12-26 (×6): qty 2

## 2022-12-26 MED ORDER — ACETAMINOPHEN 325 MG PO TABS: 650.0000 mg | ORAL_TABLET | Freq: Three times a day (TID) | ORAL | Status: DC

## 2022-12-26 MED ORDER — AMOXICILLIN-POT CLAVULANATE 875-125 MG PO TABS
1.0000 | ORAL_TABLET | Freq: Two times a day (BID) | ORAL | Status: AC
  Administered 2022-12-27 – 2022-12-29 (×6): 1 via ORAL
  Filled 2022-12-26 (×6): qty 1

## 2022-12-26 NOTE — Progress Notes (Signed)
PT Cancellation Note  Patient Details Name: KHAMONI LEVENE MRN: [id number] DOB: [birth year]   Cancelled Treatment:    Reason Eval/Treat Not Completed: Other (comment). Orders Received. Chart Reviewed. PT attempted, patient refused, "this is not a good time". PT to re-attempt at later date/time as appropriate.   Creed Copper Fairly, PT, DPT 12/26/22 1:51 PM

## 2022-12-26 NOTE — Plan of Care (Signed)

## 2022-12-26 NOTE — Progress Notes (Addendum)
Progress Note    Alexandria [name]  HYQ:[id number] DOB: 1935-07-29  DOA: 2023/01/21 PCP: Joaquim Nam, MD      Brief Narrative:    Medical records reviewed and are as summarized below:  Alexandria [name] is a 87 y.o. female with medical history significant for anxiety, aortic stenosis, COPD, coronary artery disease, type 2 diabetes mellitus, atrial fibrillation, GERD, hypertension, dyslipidemia, hypothyroidism, OSA, CKD stage IIIb, peripheral neuropathy, obesity, who presented to the hospital with abdominal pain.  She said abdominal pain has been going on intermittently for about 3 to 4 weeks.  She has also been having low back pain for about a couple of weeks that has slowly worsened.  There was also report of diarrhea.  Patient however said stools were not watery but reportedly she had bloodstained stool about 3 days prior to admission.  Stools were mostly greenish in color.  There was concern of C. difficile infection and apparently tested positive so she was started on oral vancomycin.       Assessment/Plan:   Principal Problem:   Acute diverticulitis Active Problems:   Acute lower UTI   Closed compression fracture of body of L1 vertebra (HCC)   Hypothyroidism   Paroxysmal atrial fibrillation (HCC)   Chronic diastolic CHF (congestive heart failure) (HCC)   Coronary artery disease   GERD without esophagitis   Dyslipidemia   Anxiety and depression   C. difficile colitis    Body mass index is 36.44 kg/m.  (Obesity)    Acute diverticulitis: Discontinue IV ceftriaxone and metronidazole.  Start Augmentin tomorrow.  Analgesics as needed for pain. Gastric fold thickening on CT,?  Gastritis: Continue Protonix.   Diarrhea: She reported some watery stools this morning, about 2 episodes.  It is not clear whether this is from IV antibiotics or C. difficile infection.   C. difficile infection: No diarrhea so stool for C. difficile testing could not be done in the  hospital.  Patient had a positive C. difficile toxin test on 12/22/2022.  Copy of report was faxed to the unit. Stool for occult blood, lactoferrin and C.diff molecular (toxin B) were positive.  Reportedly, patient was started on oral vancomycin prior to coming to the hospital.  Continue oral vancomycin with plan to treat for 10 days.   Acute UTI ruled out: Urine culture showed multiple species.   L1 compression fracture: Analgesics as needed for pain.  Patient declined L TSO brace.  She has been evaluated by neurosurgeon and no surgical intervention required.   PT and OT evaluation   Chronic diastolic CHF: Compensated.  Torsemide on hold because of soft blood pressure and diarrhea.  Apparently blood pressure runs on the low side.             Other comorbidities include COPD, paroxysmal atrial fibrillation, CAD, GERD, hypothyroidism, anxiety, depression, dyslipidemia   Plan discussed with Suzie Portela (sister) at the bedside and [name] (daughter) over the phone. [name] had questions about Lovenox for DVT prophylaxis.  She said her mom is usually anemic and she was worried about risk of bleeding.  We discussed the risks and benefits of low-dose Lovenox for DVT prophylaxis.  She and the patient have decided to continue with low-dose Lovenox.   She has decided she does not want patient to go back to [facility].  She prefers outpatient be discharged to another nursing facility.  She says she is already having discussions with facility to see whether patient could go there. She  had multiple questions and all her questions were answered.   Diet Order             Diet heart healthy/carb modified Room service appropriate? Yes; Fluid consistency: Thin  Diet effective now                            Consultants: Neurosurgeon  Procedures: None    Medications:    acetaminophen  650 mg Oral TID   amiodarone  200 mg Oral Daily   [START ON 12/27/2022] amoxicillin-clavulanate  1  tablet Oral Q12H   arformoterol  15 mcg Nebulization QHS   budesonide  0.5 mg Nebulization BID   busPIRone  15 mg Oral TID   cholecalciferol  2,000 Units Oral Daily   cyanocobalamin  2,000 mcg Oral Daily   enoxaparin (LOVENOX) injection  0.5 mg/kg Subcutaneous Q24H   ezetimibe  10 mg Oral QHS   ferrous sulfate  325 mg Oral Daily   FLUoxetine  20 mg Oral BID   fluticasone  2 spray Each Nare Daily   levothyroxine  125 mcg Oral Q0600   lidocaine  1 patch Transdermal Q24H   pantoprazole  40 mg Oral Daily   polyethylene glycol  17 g Oral Daily   potassium chloride  10 mEq Oral Daily   revefenacin  175 mcg Nebulization Daily   rosuvastatin  10 mg Oral QODAY   senna-docusate  1 tablet Oral QHS   sodium chloride flush  10 mL Intravenous Q12H   vancomycin  125 mg Oral QID   Continuous Infusions:  cefTRIAXone (ROCEPHIN)  IV 2 g (12/25/22 2026)   metronidazole 500 mg (12/26/22 1216)     Anti-infectives (From admission, onward)    Start     Dose/Rate Route Frequency Ordered Stop   12/27/22 1000  amoxicillin-clavulanate (AUGMENTIN) 875-125 MG per tablet 1 tablet        1 tablet Oral Every 12 hours 12/26/22 0832 12/30/22 0959   12/25/22 1100  vancomycin (VANCOCIN) capsule 125 mg        125 mg Oral 4 times daily 12/25/22 1005 01/04/23 0959   January 21, 2023 2115  cefTRIAXone (ROCEPHIN) 2 g in sodium chloride 0.9 % 100 mL IVPB        2 g 200 mL/hr over 30 Minutes Intravenous Every 24 hours 01-21-23 2106 12/26/22 2359   21-Jan-2023 2115  metroNIDAZOLE (FLAGYL) IVPB 500 mg        500 mg 100 mL/hr over 60 Minutes Intravenous Every 8 hours 2023/01/21 2106 12/26/22 2359   Jan 21, 2023 2045  cefTRIAXone (ROCEPHIN) 2 g in sodium chloride 0.9 % 100 mL IVPB  Status:  Discontinued        2 g 200 mL/hr over 30 Minutes Intravenous  Once January 21, 2023 2044 21-Jan-2023 2149   01/21/23 2045  metroNIDAZOLE (FLAGYL) IVPB 500 mg  Status:  Discontinued        500 mg 100 mL/hr over 60 Minutes Intravenous  Once 01-21-2023 2044  Jan 21, 2023 2149              Family Communication/Anticipated D/C date and plan/Code Status   DVT prophylaxis:      Code Status: Full Code  Family Communication: Plan discussed with Suzie Portela, sister, at the bedside and [name], daughter, over the phone Disposition Plan: Plan to discharge to SNF  Status is: Inpatient Remains inpatient appropriate because: On IV antibiotics        Subjective:   Interval events  noted.  She states she is feeling better.  Abdominal pain has improved.  She has back pain when she gets around. Suzie Portela, sister, was at the bedside.  [name], daughter, was on speaker phone. [name] was concerned that patient was having more pain when she tried to get around and that she is not getting around that much because of the pain.  She requested that Tylenol be scheduled and also wanted patient to have PT.  Objective:    Vitals:   12/25/22 0750 12/25/22 1526 12/25/22 2241 12/26/22 0827  BP: (!) 142/41 (!) 106/24 (!) 103/28 (!) 125/47  Pulse: (!) 58 61 (!) 55 65  Resp: 18 18 18 16   Temp: 97.9 F (36.6 C) 97.7 F (36.5 C) 97.7 F (36.5 C) 98.2 F (36.8 C)  TempSrc: Oral Oral    SpO2: 95% 95% 96% 100%  Weight:      Height:       No data found.   Intake/Output Summary (Last 24 hours) at 12/26/2022 1256 Last data filed at 12/26/2022 4098 Gross per 24 hour  Intake --  Output 600 ml  Net -600 ml   Filed Weights   12-28-22 1431  Weight: 99.3 kg    Exam:  GEN: NAD SKIN: Warm and dry EYES: No pallor or icterus ENT: MMM CV: RRR PULM: CTA B ABD: soft, ND, NT, +BS CNS: AAO x 3, non focal EXT: No edema or tenderness        Data Reviewed:   I have personally reviewed following labs and imaging studies:  Labs: Labs show the following:   Basic Metabolic Panel: Recent Labs  Lab 12-28-22 1431 12/24/22 0200  NA 142 142  K 3.1* 3.5  CL 99 101  CO2 31 30  GLUCOSE 165* 132*  BUN 24* 20  CREATININE 1.39* 1.33*  CALCIUM 8.6* 8.4*    GFR Estimated Creatinine Clearance: 36.1 mL/min (A) (by C-G formula based on SCr of 1.33 mg/dL (H)). Liver Function Tests: Recent Labs  Lab Dec 28, 2022 1431  AST 16  ALT 16  ALKPHOS 102  BILITOT 0.6  PROT 6.2*  ALBUMIN 3.2*   Recent Labs  Lab 2022-12-28 1431  LIPASE 27   No results for input(s): "AMMONIA" in the last 168 hours. Coagulation profile No results for input(s): "INR", "PROTIME" in the last 168 hours.  CBC: Recent Labs  Lab Dec 28, 2022 1431 12/24/22 0200  WBC 6.5 11.1*  HGB 9.5* 10.3*  HCT 31.4* 33.8*  MCV 105.0* 104.3*  PLT 166 153   Cardiac Enzymes: No results for input(s): "CKTOTAL", "CKMB", "CKMBINDEX", "TROPONINI" in the last 168 hours. BNP (last 3 results) Recent Labs    04/22/22 1624  PROBNP 69.0   CBG: No results for input(s): "GLUCAP" in the last 168 hours. D-Dimer: No results for input(s): "DDIMER" in the last 72 hours. Hgb A1c: No results for input(s): "HGBA1C" in the last 72 hours. Lipid Profile: No results for input(s): "CHOL", "HDL", "LDLCALC", "TRIG", "CHOLHDL", "LDLDIRECT" in the last 72 hours. Thyroid function studies: No results for input(s): "TSH", "T4TOTAL", "T3FREE", "THYROIDAB" in the last 72 hours.  Invalid input(s): "FREET3" Anemia work up: No results for input(s): "VITAMINB12", "FOLATE", "FERRITIN", "TIBC", "IRON", "RETICCTPCT" in the last 72 hours. Sepsis Labs: Recent Labs  Lab 12/28/2022 1431 12/24/22 0200  PROCALCITON <0.10  --   WBC 6.5 11.1*  LATICACIDVEN  --  1.9    Microbiology Recent Results (from the past 240 hour(s))  Urine Culture (for pregnant, neutropenic or urologic patients or patients  with an indwelling urinary catheter)     Status: Abnormal   Collection Time: 01-04-2023  7:48 PM   Specimen: Urine, Clean Catch  Result Value Ref Range Status   Specimen Description   Final    URINE, CLEAN CATCH Performed at [facility], [address] [place], [place] [address]    Special Requests   Final     NONE Performed at [facility], [address] [place], [place] [address]    Culture MULTIPLE SPECIES PRESENT, SUGGEST RECOLLECTION (A)  Final   Report Status 12/25/2022 FINAL  Final  Culture, blood (Routine X 2) w Reflex to ID Panel     Status: None (Preliminary result)   Collection Time: 12/24/22  2:00 AM   Specimen: BLOOD LEFT HAND  Result Value Ref Range Status   Specimen Description BLOOD LEFT HAND  Final   Special Requests   Final    BOTTLES DRAWN AEROBIC ONLY Blood Culture results may not be optimal due to an inadequate volume of blood received in culture bottles   Culture   Final    NO GROWTH 2 DAYS Performed at [facility], [address], [place], [place] [address]    Report Status PENDING  Incomplete  Culture, blood (Routine X 2) w Reflex to ID Panel     Status: None (Preliminary result)   Collection Time: 12/24/22  2:00 AM   Specimen: BLOOD RIGHT HAND  Result Value Ref Range Status   Specimen Description BLOOD RIGHT HAND  Final   Special Requests   Final    BOTTLES DRAWN AEROBIC ONLY Blood Culture adequate volume   Culture   Final    NO GROWTH 2 DAYS Performed at [facility], [address], [place], [place] [address]    Report Status PENDING  Incomplete    Procedures and diagnostic studies:  No results found.             LOS: 3 days   Ewell Benassi  [facility]   Pager on [email or web address]. If 7PM-7AM, please contact night-coverage at [email or web address]     12/26/2022, 12:56 PM

## 2022-12-26 NOTE — Plan of Care (Signed)

## 2022-12-27 DIAGNOSIS — K5792 Diverticulitis of intestine, part unspecified, without perforation or abscess without bleeding: Secondary | ICD-10-CM | POA: Diagnosis not present

## 2022-12-27 LAB — CBC WITH DIFFERENTIAL/PLATELET
Abs Immature Granulocytes: 0.03 10*3/uL (ref 0.00–0.07)
Basophils Absolute: 0 10*3/uL (ref 0.0–0.1)
Basophils Relative: 0 %
Eosinophils Absolute: 0.1 10*3/uL (ref 0.0–0.5)
Eosinophils Relative: 3 %
HCT: 28.7 % — ABNORMAL LOW (ref 36.0–46.0)
Hemoglobin: 8.6 g/dL — ABNORMAL LOW (ref 12.0–15.0)
Immature Granulocytes: 1 %
Lymphocytes Relative: 12 %
Lymphs Abs: 0.5 10*3/uL — ABNORMAL LOW (ref 0.7–4.0)
MCH: 31.5 pg (ref 26.0–34.0)
MCHC: 30 g/dL (ref 30.0–36.0)
MCV: 105.1 fL — ABNORMAL HIGH (ref 80.0–100.0)
Monocytes Absolute: 0.3 10*3/uL (ref 0.1–1.0)
Monocytes Relative: 8 %
Neutro Abs: 3.3 10*3/uL (ref 1.7–7.7)
Neutrophils Relative %: 76 %
Platelets: 146 10*3/uL — ABNORMAL LOW (ref 150–400)
RBC: 2.73 MIL/uL — ABNORMAL LOW (ref 3.87–5.11)
RDW: 15.7 % — ABNORMAL HIGH (ref 11.5–15.5)
WBC: 4.3 10*3/uL (ref 4.0–10.5)
nRBC: 0 % (ref 0.0–0.2)

## 2022-12-27 LAB — BASIC METABOLIC PANEL
Anion gap: 8 (ref 5–15)
BUN: 16 mg/dL (ref 8–23)
CO2: 29 mmol/L (ref 22–32)
Calcium: 8.6 mg/dL — ABNORMAL LOW (ref 8.9–10.3)
Chloride: 105 mmol/L (ref 98–111)
Creatinine, Ser: 1.12 mg/dL — ABNORMAL HIGH (ref 0.44–1.00)
GFR, Estimated: 48 mL/min — ABNORMAL LOW (ref >60)
Glucose, Bld: 104 mg/dL — ABNORMAL HIGH (ref 70–99)
Potassium: 3.9 mmol/L (ref 3.5–5.1)
Sodium: 142 mmol/L (ref 135–145)

## 2022-12-27 LAB — MAGNESIUM: Magnesium: 2.2 mg/dL (ref 1.7–2.4)

## 2022-12-27 LAB — PHOSPHORUS: Phosphorus: 2.9 mg/dL (ref 2.5–4.6)

## 2022-12-27 NOTE — Consult Note (Signed)
[facility] Customer service manager [facility]) Accountable Care Organization (ACO) [facility] Liaison Note  12/27/2022  Alexandria [name] Dec 07, 1935 [id number]  Location: [facility] RN Hospital Liaison met patient at bedside at [facility].  Insurance:  Medicare   Alexandria [name] is a 87 y.o. female who is a Primary Care Patient of Para March, Dwana Curd, MD ([facility] [place] [facility]). The patient was screened for 30 day readmission hospitalization with noted extreme risk score for unplanned readmission risk with 3 IP/4 ED in 6 months.  The patient was assessed for potential [facility]) Care Management service needs for post hospital transition for care coordination. Review of patient's electronic medical record reveals patient was admitted with Acute Diverticulitis. Pt will return back to [facility] and followed closely by Oncology team.   Referral request for community care coordination:    [facility] Care Management/Population Health does not replace or interfere with any arrangements made by the Inpatient Transition of Care team.   For questions contact:   Elliot Cousin, RN, [facility] Liaison [place]   [facility], Population Health Office Hours MTWF  8:00 am-6:00 pm Direct Dial: [phone] mobile [phone] [Office toll free line] Office Hours are M-F 8:30 - 5 pm [name]

## 2022-12-27 NOTE — Evaluation (Signed)
Physical Therapy Evaluation Patient Details Name: SHAMELIA ZAFAR MRN: [id number] DOB: [birth year] Today's Date: 12/27/2022  History of Present Illness  BAYLYN WEYGANDT is a 87 y.o. female with medical history significant for anxiety, aortic stenosis, COPD, CAD, Type II DM, AFIB, GERD, HTN, dyslipidemia, hypothyroidism, OSA, CKD stage IIIb, peripheral neuropathy, obesity, who presented to the hospital with abdominal pain.  Positive for C. Diff, compression fx of L1 vertebra (TLSO for comfort) and CHF.   Clinical Impression  Patient A&Ox4, seated on [facility] with nursing students assisting with a bath. Per pt at baseline she has a home and caregivers that stay during the night and assists with breakfast, pt endorsed ambulating with her rollator household distances and family assists with IADLs.   The patient was able to perform sit <> stand from [facility] and from EOB during session. MinA for steadying either her, or RW. She ambulated ~43ft in room to recliner, noted for significant SOB and asked for her inhaler. spO2 88%-94% on 2L. Further mobility deferred due to fatigue and SOB. Pt endorsed wanting to be able to walk around her home with minimal discomfort/fatigue/SOB.  Overall the patient demonstrated deficits (see "PT Problem List") that impede the patient's functional abilities, safety, and mobility and would benefit from skilled PT intervention.          If plan is discharge home, recommend the following: A little help with walking and/or transfers;A little help with bathing/dressing/bathroom;Assistance with cooking/housework;Help with stairs or ramp for entrance;Assist for transportation   Can travel by private vehicle   Yes    Equipment Recommendations Other (comment) (TBD)  Recommendations for Other Services       Functional Status Assessment Patient has had a recent decline in their functional status and demonstrates the ability to make significant improvements in function in a reasonable  and predictable amount of time.     Precautions / Restrictions Precautions Precautions: Fall Precaution Comments: watch O2 Required Braces or Orthoses:  (TLSO for comfort that pt refuses due to her breathing complaints at baseline) Restrictions Weight Bearing Restrictions: No      Mobility  Bed Mobility               General bed mobility comments: seated on BSC upon PT arrival with nursing students    Transfers Overall transfer level: Needs assistance Equipment used: Rolling walker (2 wheels) Transfers: Sit to/from Stand Sit to Stand: Min assist           General transfer comment: minA for steadying or minA for RW stabilization.    Ambulation/Gait Ambulation/Gait assistance: Contact guard assist Gait Distance (Feet): 15 Feet Assistive device: Rolling walker (2 wheels)         General Gait Details: pt able to ambulate around bed to recliner in room but unable to tolerate further distances due to SOB  Stairs            Wheelchair Mobility     Tilt Bed    Modified Rankin (Stroke Patients Only)       Balance Overall balance assessment: Needs assistance Sitting-balance support: Feet supported, Single extremity supported Sitting balance-[name]: Good     Standing balance support: During functional activity, Reliant on assistive device for balance, Bilateral upper extremity supported Standing balance-[name]: Fair Standing balance comment: limited tolerance to standing                             Pertinent Vitals/Pain  Pain Assessment Pain Assessment: 0-10 Pain Score: 5  Pain Descriptors / Indicators: Aching, Discomfort, Dull Pain Intervention(s): Limited activity within patient's tolerance, Monitored during session, Repositioned    Home Living Family/patient expects to be discharged to:: Private residence Living Arrangements: Alone Available Help at Discharge: Personal care attendant;Other (Comment);Available  PRN/intermittently Type of Home: Apartment Home Access: Ramped entrance       Home Layout: Multi-level;Able to live on main level with bedroom/bathroom Home Equipment: Rollator (4 wheels);Wheelchair - Forensic psychologist (2 wheels);Grab bars - toilet;BSC/3in1      Prior Function Prior Level of Function : Needs assist       Physical Assist : Mobility (physical);ADLs (physical) Mobility (physical): Bed mobility;Transfers;Gait ADLs (physical): Bathing;Dressing;Toileting;IADLs Mobility Comments: using four wheeled walker for ambulating short distances. Denies falls, but chart review indicates falls in the past 6 mo ADLs Comments: Pt ambulates short distances to bathroom and kitchen with rollator and performs sink bath herself. Caregiver stays at night for safety and assists with breakfast. Family assists with IADLs such as groceries and medication management.     Extremity/Trunk Assessment   Upper Extremity Assessment Upper Extremity Assessment: Defer to OT evaluation    Lower Extremity Assessment Lower Extremity Assessment: Generalized weakness       Communication   Communication Communication: No apparent difficulties  Cognition Arousal: Alert Behavior During Therapy: WFL for tasks assessed/performed Overall Cognitive Status: No family/caregiver present to determine baseline cognitive functioning                                 General Comments: A&Ox4        General Comments General comments (skin integrity, edema, etc.): Dyspnea with sitting upright, pt states she uses O2 chronically at home (has 50 ft cord). O2 dropping to 89% in standing on 2L, left with SpO2 at 97% on 2L via [place]    Exercises     Assessment/Plan    PT Assessment Patient needs continued PT services  PT Problem List Decreased strength;Decreased activity tolerance;Decreased balance;Decreased mobility;Cardiopulmonary status limiting activity       PT Treatment Interventions DME  instruction;Gait training;Functional mobility training;Therapeutic activities;Therapeutic exercise;Balance training;Patient/family education    PT Goals (Current goals can be found in the Care Plan section)  Acute Rehab PT Goals Patient Stated Goal: go to rehab then home PT Goal Formulation: With patient Time For Goal Achievement: 01/10/23 Potential to Achieve Goals: Fair    Frequency Min 1X/week     Co-evaluation               AM-PAC PT "6 Clicks" Mobility  Outcome Measure Help needed turning from your back to your side while in a flat bed without using bedrails?: A Little Help needed moving from lying on your back to sitting on the side of a flat bed without using bedrails?: A Little Help needed moving to and from a bed to a chair (including a wheelchair)?: A Little Help needed standing up from a chair using your arms (e.g., wheelchair or bedside chair)?: A Little Help needed to walk in hospital room?: A Little Help needed climbing 3-5 steps with a railing? : A Lot 6 Click Score: 17    End of Session Equipment Utilized During Treatment: Oxygen Activity Tolerance: Patient tolerated treatment well Patient left: in chair;with chair alarm set;with call bell/phone within reach Nurse Communication: Mobility status PT Visit Diagnosis: Unsteadiness on feet (R26.81);Muscle weakness (generalized) (M62.81)  Time: 1610-9604 PT Time Calculation (min) (ACUTE ONLY): 21 min   Charges:   PT Evaluation $PT Eval Moderate Complexity: 1 Mod PT Treatments $Therapeutic Activity: 8-22 mins PT General Charges $$ ACUTE PT VISIT: 1 Visit         Olga Coaster PT, DPT 11:47 AM,12/27/22

## 2022-12-27 NOTE — Plan of Care (Signed)
  Problem: Education: Goal: Knowledge of General Education information will improve Description: Including pain rating scale, medication(s)/side effects and non-pharmacologic comfort measures Outcome: Progressing   Problem: Health Behavior/Discharge Planning: Goal: Ability to manage health-related needs will improve Outcome: Progressing   Problem: Activity: Goal: Risk for activity intolerance will decrease Outcome: Progressing   Problem: Nutrition: Goal: Adequate nutrition will be maintained Outcome: Progressing   Problem: Elimination: Goal: Will not experience complications related to bowel motility Outcome: Progressing   Problem: Pain Managment: Goal: General experience of comfort will improve Outcome: Progressing   

## 2022-12-27 NOTE — Evaluation (Signed)
Occupational Therapy Evaluation Patient Details Name: Alexandria [name] MRN: [id number] DOB: November 11, 1935 Today's Date: 12/27/2022   History of Present Illness Alexandria [name] is a 87 y.o. female with medical history significant for anxiety, aortic stenosis, COPD, CAD, Type II DM, AFIB, GERD, HTN, dyslipidemia, hypothyroidism, OSA, CKD stage IIIb, peripheral neuropathy, obesity, who presented to the hospital with abdominal pain.  Positive for C. Diff, compression fx of L1 vertebra (TLSO for comfort) and CHF.   Clinical Impression   Pt seen today for OT eval, received upright in recliner and RN present for medication administration. Endorses 5/10 pain in "midriff" area. PTA, pt reports living alone, has an aide that stays overnight that provides assistance for ADLs/IADLs, with sister assisting as needed. Ambulates household distances with rollator. Pt denies recent falls, chart review indicates otherwise. Pt uses O2 at home on 2L and reports she uses a 50 ft cord.    Pt presents to acute OT demonstrating impaired ADL performance and functional mobility 2/2 decreased tolerance to activity, generalized weakness, impaired balance and safety awareness (See OT problem list for additional functional deficits). Pt currently requires setup for grooming and UB ADL tasks, CGA with RW for transfers, and likely MIN A for LB ADLs.  Pt would benefit from skilled OT services to address noted impairments and functional limitations (see below for any additional details) in order to maximize safety and independence while minimizing falls risk and caregiver burden. Anticipate the need for follow up OT services upon acute hospital DC.          If plan is discharge home, recommend the following: A little help with walking and/or transfers;A little help with bathing/dressing/bathroom;Assistance with cooking/housework;Assist for transportation;Help with stairs or ramp for entrance;Direct supervision/assist for financial  management;Direct supervision/assist for medications management    Functional Status Assessment  Patient has had a recent decline in their functional status and demonstrates the ability to make significant improvements in function in a reasonable and predictable amount of time.  Equipment Recommendations  Other (comment)    Recommendations for Other Services Other (comment)     Precautions / Restrictions Precautions Precautions: Fall Precaution Comments: watch O2 Required Braces or Orthoses:  (TLSO for comfort (pt refuses)) Restrictions Weight Bearing Restrictions: No      Mobility Bed Mobility               General bed mobility comments: Up in chair upon arrival    Transfers Overall transfer level: Needs assistance Equipment used: Rolling walker (2 wheels) Transfers: Sit to/from Stand Sit to Stand: Contact guard assist           General transfer comment: cues for technique, pt needed 2x attempts to power up into full standing      Balance Overall balance assessment: Needs assistance Sitting-balance support: Feet supported, Single extremity supported Sitting balance-[name]: Good     Standing balance support: During functional activity, Reliant on assistive device for balance, Bilateral upper extremity supported Standing balance-[name]: Fair Standing balance comment: limited tolerance to standing                           ADL either performed or assessed with clinical judgement   ADL Overall ADL's : Needs assistance/impaired Eating/Feeding: Set up;Sitting   Grooming: Oral care;Set up;Sitting                   Toilet Transfer: Contact guard assist;Rolling walker (2 wheels);Cueing for safety;Cueing for  sequencing Toilet Transfer Details (indicate cue type and reason): clinical judgement         Functional mobility during ADLs: Cueing for safety;Cueing for sequencing;Rolling walker (2 wheels) General ADL Comments: Brushes  teeth with setup sitting in recliner, stands from recliner with CGA. Endorses pain/fatigue as limiting factors and declines further mobility in room.     Vision Baseline Vision/History: 1 Wears glasses              Pertinent Vitals/Pain Pain Assessment Pain Assessment: 0-10 Pain Score: 5  Pain Location: stomach Pain Descriptors / Indicators: Aching, Discomfort, Dull Pain Intervention(s): Limited activity within patient's tolerance, RN gave pain meds during session     Extremity/Trunk Assessment Upper Extremity Assessment Upper Extremity Assessment: Overall WFL for tasks assessed;Generalized weakness   Lower Extremity Assessment Lower Extremity Assessment: Overall WFL for tasks assessed;Generalized weakness       Communication Communication Communication: No apparent difficulties   Cognition Arousal: Alert Behavior During Therapy: WFL for tasks assessed/performed Overall Cognitive Status: No family/caregiver present to determine baseline cognitive functioning                           Safety/Judgement: Decreased awareness of safety, Decreased awareness of deficits     General Comments: A&Ox4, pt with decreased awareness of deficits and overall health situation     General Comments  Dyspnea with sitting upright, pt states she uses O2 chronically at home (has 50 ft cord). O2 dropping to 89% in standing on 2L, left with SpO2 at 97% on 2L via [place]            Home Living Family/patient expects to be discharged to:: Private residence Living Arrangements: Alone Available Help at Discharge: Personal care attendant;Other (Comment);Available PRN/intermittently Type of Home: Apartment Home Access: Ramped entrance     Home Layout: Multi-level;Able to live on main level with bedroom/bathroom     Bathroom Shower/Tub: Sponge bathes at baseline   Bathroom Toilet: Standard     Home Equipment: Rollator (4 wheels);Wheelchair - Forensic psychologist (2 wheels);Grab  bars - toilet;BSC/3in1          Prior Functioning/Environment Prior Level of Function : Needs assist       Physical Assist : Mobility (physical);ADLs (physical) Mobility (physical): Bed mobility;Transfers;Gait ADLs (physical): Bathing;Dressing;Toileting;IADLs Mobility Comments: using four wheeled walker for ambulating short distances. Denies falls, but chart review indicates falls in the past 6 mo ADLs Comments: Pt ambulates short distances to bathroom and kitchen with rollator and performs sink bath herself. Caregiver stays at night for safety. Family assists with IADLs such as groceries and medication management.        OT Problem List: Decreased strength;Decreased activity tolerance;Decreased safety awareness;Impaired balance (sitting and/or standing);Decreased knowledge of use of DME or AE      OT Treatment/Interventions: Self-care/ADL training;Therapeutic exercise;Therapeutic activities;Energy conservation;DME and/or AE instruction;Patient/family education;Balance training    OT Goals(Current goals can be found in the care plan section) Acute Rehab OT Goals Patient Stated Goal: to go to rehab OT Goal Formulation: With patient Time For Goal Achievement: 01/10/23 Potential to Achieve Goals: Good  OT Frequency: Min 1X/week       AM-PAC OT "6 Clicks" Daily Activity     Outcome Measure Help from another person eating meals?: None Help from another person taking care of personal grooming?: A Little Help from another person toileting, which includes using toliet, bedpan, or urinal?: A Little Help from another person bathing (including washing,  rinsing, drying)?: A Little Help from another person to put on and taking off regular upper body clothing?: A Little Help from another person to put on and taking off regular lower body clothing?: A Lot 6 Click Score: 18   End of Session Equipment Utilized During Treatment: Gait belt;Rolling walker (2 wheels);Oxygen Nurse Communication:  Mobility status;Other (comment)  Activity Tolerance: Patient limited by pain;Patient limited by fatigue Patient left: in chair;with call bell/phone within reach;with chair alarm set  OT Visit Diagnosis: Unsteadiness on feet (R26.81);Muscle weakness (generalized) (M62.81);History of falling (Z91.81)                Time: 7425-9563 OT Time Calculation (min): 32 min Charges:  OT General Charges $OT Visit: 1 Visit OT Evaluation $OT Eval Low Complexity: 1 Low OT Treatments $Self Care/Home Management : 8-22 mins  Tavin Vernet L. Alexzandra Bilton, OTR/L  12/27/22, 10:04 AM

## 2022-12-27 NOTE — TOC Progression Note (Signed)
Transition of Care [facility]) - Progression Note    Patient Details  Name: Alexandria [name] MRN: [id number] Date of Birth: [birth year]  [facility] CM/SW Contact  Marlowe Sax, RN Phone Number: 12/27/2022, 3:56 PM  Clinical Narrative:     Left a VM for [name] the patient's daughter asking for a call back       Expected Discharge Plan and Services                                               Social Determinants of Health (SDOH) Interventions SDOH Screenings   Food Insecurity: No Food Insecurity (12/24/2022)  Housing: Low Risk  (12/24/2022)  Transportation Needs: No Transportation Needs (12/24/2022)  Utilities: Not At Risk (12/24/2022)  Alcohol Screen: Low Risk  (10/21/2022)  Depression (PHQ2-9): Low Risk  (10/21/2022)  Recent Concern: Depression (PHQ2-9) - Medium Risk (08/03/2022)  Financial Resource Strain: Low Risk  (10/21/2022)  Physical Activity: Insufficiently Active (10/21/2022)  Social Connections: Socially Isolated (10/21/2022)  Stress: No Stress Concern Present (10/21/2022)  Tobacco Use: Medium Risk (12/21/2022)  Health Literacy: Adequate Health Literacy (10/21/2022)    Readmission Risk Interventions    11/18/2022    4:47 PM  Readmission Risk Prevention Plan  Transportation Screening Complete  Medication Review (RN Care Manager) Complete  HRI or Home Care Consult Complete  SW Recovery Care/Counseling Consult Complete  Palliative Care Screening Not Applicable  Skilled Nursing Facility Complete

## 2022-12-27 NOTE — Progress Notes (Addendum)
[facility]  - [place] [facility]   PATIENT NAME: Alexandria [name]    MR#:  [id number]  DATE OF BIRTH:  1935-05-25  SUBJECTIVE:  sitting upright in the bed eating lunch. Daughter [name] on the phone in the room. Spoke with her as well. Overall patient's day has been productive and doing well. One time Ronnie stool. Abdominal pain improving. Recommended to eat some yogurt.    VITALS:  Blood pressure (!) 152/46, pulse 69, temperature 97.8 F (36.6 C), temperature source Oral, resp. rate 18, height 5\' 5"  (1.651 m), weight 99.3 kg, SpO2 95%.  PHYSICAL EXAMINATION:   GENERAL:  87 y.o.-year-old patient with no acute distress. Morbidly obese LUNGS: decreased breath sounds bilaterally, no wheezing CARDIOVASCULAR: S1, S2 normal. No murmur   ABDOMEN: Soft, diffuse tender, nondistended. No guarding rigidity EXTREMITIES: No  edema b/l.    NEUROLOGIC: nonfocal  patient is alert and awake SKIN: No obvious rash, lesion, or ulcer.   LABORATORY PANEL:  CBC Recent Labs  Lab 12/27/22 0528  WBC 4.3  HGB 8.6*  HCT 28.7*  PLT 146*    Chemistries  Recent Labs  Lab 12/31/2022 1431 12/24/22 0200 12/27/22 0528  NA 142   < > 142  K 3.1*   < > 3.9  CL 99   < > 105  CO2 31   < > 29  GLUCOSE 165*   < > 104*  BUN 24*   < > 16  CREATININE 1.39*   < > 1.12*  CALCIUM 8.6*   < > 8.6*  MG  --   --  2.2  AST 16  --   --   ALT 16  --   --   ALKPHOS 102  --   --   BILITOT 0.6  --   --    < > = values in this interval not displayed.   Assessment and Plan  JAQUEL LEISTER is a 87 y.o. female with medical history significant for anxiety, aortic stenosis, COPD, coronary artery disease, type 2 diabetes mellitus, atrial fibrillation, GERD, hypertension, dyslipidemia, hypothyroidism, OSA, CKD stage IIIb, peripheral neuropathy, obesity, who presented to the hospital with abdominal pain.  She said abdominal pain has been going on intermittently for about 3 to 4 weeks.  She has also been  having low back pain for about a couple of weeks that has slowly worsened.  There was also report of diarrhea.  Patient however said stools were not watery but reportedly she had bloodstained stool about 3 days prior to admission.  Stools were mostly greenish in color.  There was concern of C. difficile infection and apparently tested positive so she was started on oral vancomycin.   Acute diverticulitis:Was on IV ceftriaxone and metronidazole.  Start Augmentin from today  --Analgesics as needed for pain. --Gastric fold thickening on CT,?  Gastritis: Continue Protonix. --consider GI consult if needed    Diarrhea: She reported some watery stools this morning, about 2 episodes.  It is not clear whether this is from IV antibiotics or C. difficile infection. --improving --recommend dairy products   C. difficile infection: No diarrhea so stool for C. difficile testing could not be done in the hospital.  -- Patient had a positive C. difficile toxin test on 12/22/2022.  Copy of report was faxed to the unit. Stool for occult blood, lactoferrin and C.diff molecular (toxin B) were positive.  Reportedly, patient was started on oral vancomycin prior to coming to the hospital.  Continue  oral vancomycin with plan to treat for 10 days.   Acute UTI ruled out: Urine culture showed multiple species.    L1 compression fracture: Analgesics as needed for pain.  Patient declined L TSO brace.  She has been evaluated by neurosurgeon Dr Katrinka Blazing and no surgical intervention required.   --PT and OT evaluation--recs rehab    Chronic diastolic CHF: Compensated.  Torsemide on hold because of soft blood pressure and diarrhea.  Apparently blood pressure runs on the low side.         Chronic Anemia/IDA --hgb 8.6 --follows with hematology      TOC fro d/c planning   Procedures: Family communication :[name] Consults :Neurosurgery CODE STATUS: FULL DVT Prophylaxis :lovenox refused by pt and dter--order discontinued Level of  care: Med-Surg Status is: Inpatient Remains inpatient appropriate because: TOC for d/c planning    TOTAL TIME TAKING CARE OF THIS PATIENT: 35 minutes.  >50% time spent on counselling and coordination of care  Note: This dictation was prepared with Dragon dictation along with smaller phrase technology. Any transcriptional errors that result from this process are unintentional.  Enedina Finner M.D    [facility]   CC: Primary care physician; Joaquim Nam, MD

## 2022-12-27 NOTE — TOC Progression Note (Signed)
Transition of Care [facility]) - Progression Note    Patient Details  Name: RAECHELL FOELLER MRN: [id number] Date of Birth: [birth year]  [facility] CM/SW Contact  Marlowe Sax, RN Phone Number: 12/27/2022, 4:12 PM  Clinical Narrative:    Daughter called back and stated they plan to go to [facility] but first need to go to [facility] and prefer [facility], She is agreeable to a bedsearch but not [facility], Will review bed offer once obtained        Expected Discharge Plan and Services                                               Social Determinants of Health (SDOH) Interventions SDOH Screenings   Food Insecurity: No Food Insecurity (12/24/2022)  Housing: Low Risk  (12/24/2022)  Transportation Needs: No Transportation Needs (12/24/2022)  Utilities: Not At Risk (12/24/2022)  Alcohol Screen: Low Risk  (10/21/2022)  Depression (PHQ2-9): Low Risk  (10/21/2022)  Recent Concern: Depression (PHQ2-9) - Medium Risk (08/03/2022)  Financial Resource Strain: Low Risk  (10/21/2022)  Physical Activity: Insufficiently Active (10/21/2022)  Social Connections: Socially Isolated (10/21/2022)  Stress: No Stress Concern Present (10/21/2022)  Tobacco Use: Medium Risk (12/21/2022)  Health Literacy: Adequate Health Literacy (10/21/2022)    Readmission Risk Interventions    11/18/2022    4:47 PM  Readmission Risk Prevention Plan  Transportation Screening Complete  Medication Review (RN Care Manager) Complete  HRI or Home Care Consult Complete  SW Recovery Care/Counseling Consult Complete  Palliative Care Screening Not Applicable  Skilled Nursing Facility Complete

## 2022-12-27 NOTE — [place] FL2 (Signed)
[place] MEDICAID FL2 LEVEL OF CARE FORM     IDENTIFICATION  Patient Name: Alexandria [name] Birthdate: 11-28-1935 Sex: female Admission Date (Current Location): 2023/01/11  [facility] and [place] Number:  Chiropodist and Address:  [facility], [address] [place], [place] [address]      Provider Number: [id number]  Attending Physician Name and Address:  Enedina Finner, MD  Relative Name and Phone Number:  [name] daughter [phone]    Current Level of Care: Hospital Recommended Level of Care: Skilled Nursing Facility Prior Approval Number:    Date Approved/Denied:   PASRR Number: [id number]  Discharge Plan: SNF    Current Diagnoses: Patient Active Problem List   Diagnosis Date Noted   C. difficile colitis 12/25/2022   Acute diverticulitis 01/11/23   Acute lower UTI 01/11/23   Closed compression fracture of body of L1 vertebra (HCC) 01/11/2023   Coronary artery disease 01-11-23   GERD without esophagitis 01-11-2023   Dyslipidemia 01-11-2023   Anxiety and depression 01/11/2023   Back pain 12/22/2022   Renal lesion 12/22/2022   Acute respiratory disease due to COVID-19 virus 11/29/2022   Acute on chronic respiratory failure with hypercapnia (HCC) 11/28/2022   COPD (chronic obstructive pulmonary disease) (HCC) 11/28/2022   Depression with anxiety 11/28/2022   Obesity (BMI 30-39.9) 11/28/2022   Hypernatremia 11/23/2022   Confusion 11/21/2022   Constipation 11/21/2022   Symptomatic anemia 11/17/2022   Acute on chronic diastolic CHF (congestive heart failure) (HCC) 11/17/2022   COPD with acute exacerbation (HCC) 11/17/2022   Goals of care, counseling/discussion 10/05/2022   Stage 3b chronic kidney disease (HCC) 10/05/2022   Neurogenic orthostatic hypotension (HCC) 07/13/2022   Non-pressure chronic ulcer of other part of right lower leg with other specified severity (HCC) 07/13/2022   Atherosclerotic heart disease of native  coronary artery with other forms of angina pectoris (HCC) 07/13/2022   Chronic diastolic (congestive) heart failure (HCC) 07/07/2022   Paroxysmal atrial fibrillation (HCC) 07/02/2022   Acute on chronic respiratory failure with hypoxia and hypercapnia (HCC) 06/29/2022   Acute encephalopathy 06/29/2022   Aortic stenosis, mild 03/30/2022   PVC's (premature ventricular contractions) 02/23/2022   Skin lesion 08/12/2021   Leg swelling 06/08/2021   Grief 05/27/2021   H/O laceration of skin 05/27/2021   PND (post-nasal drip) 04/27/2021   Wheezing 04/27/2021   Acute cough 04/27/2021   Physical deconditioning 01/30/2020   Healthcare maintenance 01/30/2020   PAD (peripheral artery disease) (HCC) 01/27/2020   Sore throat 11/21/2019   GERD (gastroesophageal reflux disease) 11/21/2019   CAD (coronary artery disease) 07/24/2019   Malignant neoplasm of right upper lobe of lung (HCC) 07/24/2019   Vitamin D deficiency 06/29/2018   Hyperkalemia 06/28/2018   Dysuria 01/01/2018   Abnormal findings on diagnostic imaging of lung 12/18/2017   Rhinitis 12/18/2017   Knee laceration, right, subsequent encounter 10/06/2016   Knee pain, acute 10/06/2016   Chronic pain of left knee 09/15/2016   Right foot pain 09/15/2016   Chronic respiratory failure with hypoxia (HCC) 07/14/2016   Diverticulosis of colon without hemorrhage    Hemorrhoids    Hiatal hernia    Type 2 diabetes mellitus with diabetic neuropathy, without long-term current use of insulin (HCC) 04/04/2015   Left temporal headache 03/14/2015   Vertigo 03/14/2015   Chest tightness 12/04/2014   Cystitis 11/03/2014   Acute on chronic heart failure with preserved ejection fraction (HFpEF) (HCC)    Iron deficiency anemia    OSA on CPAP  Obesity hypoventilation syndrome (HCC)    Chronic diastolic CHF (congestive heart failure) (HCC)    UTI (urinary tract infection)    Acute on chronic blood loss anemia    Medicare annual wellness visit,  subsequent 06/14/2014   Advance care planning 06/14/2014   RLQ abdominal pain 05/10/2014   COPD exacerbation (HCC) 04/28/2014   Lower extremity edema 09/15/2012   DOE (dyspnea on exertion) 08/24/2012   Shoulder pain 12/17/2011   Abdominal hernia 12/17/2011   Anxiety 09/23/2011   Osteopenia 07/25/2011   Obstructive sleep apnea 02/09/2010   Murmur 02/04/2010   CHEST PAIN 01/13/2010   PALPITATIONS 08/29/2008   ESOPHAGEAL STENOSIS 11/22/2007   DYSPHAGIA 11/08/2007   ESOPHAGITIS 10/11/2007   Edema 07/03/2007   COPD with chronic bronchitis and emphysema (HCC) 05/23/2007   ALLERGY 07/25/2006   NUMBNESS 10/06/2001   Hyperlipemia 10/04/2000   Depression, recurrent (HCC) 03/08/1997   Essential hypertension 03/08/1992   Obesity, Class III, BMI 40-49.9 (morbid obesity) (HCC) 03/08/1989   ROTATOR CUFF SYNDROME, RIGHT 03/08/1982   Hypothyroidism 03/08/1968    Orientation RESPIRATION BLADDER Height & Weight     Self, Time, Situation, Place  O2 (2 liters PRN) Continent Weight: 99.3 kg Height:  5\' 5"  (165.1 cm)  BEHAVIORAL SYMPTOMS/MOOD NEUROLOGICAL BOWEL NUTRITION STATUS      Continent Diet (See dc summary)  AMBULATORY STATUS COMMUNICATION OF NEEDS Skin   Limited Assist Verbally Normal                       Personal Care Assistance Level of Assistance  Bathing, Feeding, Dressing Bathing Assistance: Limited assistance Feeding assistance: Independent Dressing Assistance: Limited assistance     Functional Limitations Info  Sight, Hearing, Speech Sight Info: Impaired (glasses) Hearing Info: Adequate Speech Info: Adequate    SPECIAL CARE FACTORS FREQUENCY  PT (By licensed PT), OT (By licensed OT)     PT Frequency: 5 times per week OT Frequency: 5 times per week            Contractures Contractures Info: Not present    Additional Factors Info  Code Status, Allergies Code Status Info: Full code Allergies Info: Antihistamines, Diphenhydramine-type, Incruse Ellipta  (Umeclidinium Bromide), Clarithromycin, Codeine, Diphenhydramine, Pregabalin, Sulfonamide Derivatives, Tiotropium Bromide Monohydrate, Umeclidinium, Vraylar (Cariprazine)           Current Medications (12/27/2022):  This is the current hospital active medication list Current Facility-Administered Medications  Medication Dose Route Frequency Provider Last Rate Last Admin   acetaminophen (TYLENOL) tablet 650 mg  650 mg Oral TID Lurene Shadow, MD   650 mg at 12/27/22 1554   albuterol (PROVENTIL) (2.5 MG/3ML) 0.083% nebulizer solution 3 mL  3 mL Inhalation Q6H PRN Mansy, Jan A, MD       amiodarone (PACERONE) tablet 200 mg  200 mg Oral Daily Mansy, Jan A, MD   200 mg at 12/27/22 0922   amoxicillin-clavulanate (AUGMENTIN) 875-125 MG per tablet 1 tablet  1 tablet Oral Q12H Lurene Shadow, MD   1 tablet at 12/27/22 0922   arformoterol [facility]) nebulizer solution 15 mcg  15 mcg Nebulization QHS Mansy, Jan A, MD   15 mcg at 12/26/22 1954   budesonide (PULMICORT) nebulizer solution 0.5 mg  0.5 mg Nebulization BID Mansy, Jan A, MD   0.5 mg at 12/27/22 1610   busPIRone (BUSPAR) tablet 15 mg  15 mg Oral TID Mansy, Jan A, MD   15 mg at 12/27/22 1554   cholecalciferol (VITAMIN D3) 25 MCG (1000 UNIT)  tablet 2,000 Units  2,000 Units Oral Daily Mansy, Jan A, MD   2,000 Units at 12/27/22 2542   cyanocobalamin (VITAMIN B12) tablet 2,000 mcg  2,000 mcg Oral Daily Mansy, Jan A, MD   2,000 mcg at 12/27/22 7062   enoxaparin (LOVENOX) injection 50 mg  0.5 mg/kg Subcutaneous Q24H Mansy, Jan A, MD   50 mg at 12/26/22 2125   ezetimibe (ZETIA) tablet 10 mg  10 mg Oral QHS Mansy, Jan A, MD   10 mg at 12/26/22 2126   ferrous sulfate tablet 325 mg  325 mg Oral Daily Mansy, Jan A, MD   325 mg at 12/27/22 0921   FLUoxetine (PROZAC) capsule 20 mg  20 mg Oral BID Mansy, Jan A, MD   20 mg at 12/27/22 0922   fluticasone (FLONASE) 50 MCG/ACT nasal spray 2 spray  2 spray Each Nare Daily Mansy, Jan A, MD   2 spray at 12/27/22 0920    guaiFENesin (MUCINEX) 12 hr tablet 1,200 mg  1,200 mg Oral BID PRN Mansy, Jan A, MD       levothyroxine (SYNTHROID) tablet 125 mcg  125 mcg Oral Q0600 Mansy, Jan A, MD   125 mcg at 12/27/22 0503   lidocaine (LIDODERM) 5 % 1 patch  1 patch Transdermal Q24H Mansy, Jan A, MD   1 patch at 12/27/22 0920   loratadine (CLARITIN) tablet 10 mg  10 mg Oral Daily PRN Mansy, Jan A, MD       LORazepam (ATIVAN) tablet 0.5 mg  0.5 mg Oral Q4H PRN Mansy, Jan A, MD   0.5 mg at 12/27/22 0430   magnesium hydroxide (MILK OF MAGNESIA) suspension 30 mL  30 mL Oral Daily PRN Mansy, Jan A, MD       morphine (PF) 2 MG/ML injection 2 mg  2 mg Intravenous Q4H PRN Mansy, Jan A, MD       nitroGLYCERIN (NITROSTAT) SL tablet 0.4 mg  0.4 mg Sublingual Q5 min PRN Mansy, Jan A, MD       ondansetron [facility]) tablet 4 mg  4 mg Oral Q6H PRN Mansy, Jan A, MD       Or   ondansetron [facility]) injection 4 mg  4 mg Intravenous Q6H PRN Mansy, Jan A, MD       pantoprazole (PROTONIX) EC tablet 40 mg  40 mg Oral Daily Mansy, Jan A, MD   40 mg at 12/27/22 3762   polyethylene glycol (MIRALAX / GLYCOLAX) packet 17 g  17 g Oral Daily Mansy, Jan A, MD   17 g at 12/27/22 0920   potassium chloride (KLOR-CON M) CR tablet 10 mEq  10 mEq Oral Daily Mansy, Jan A, MD   10 mEq at 12/27/22 8315   revefenacin (YUPELRI) nebulizer solution 175 mcg  175 mcg Nebulization Daily Mansy, Jan A, MD   175 mcg at 12/27/22 1761   rosuvastatin (CRESTOR) tablet 10 mg  10 mg Oral QODAY Mansy, Jan A, MD   10 mg at 12/27/22 6073   senna-docusate (Senokot-S) tablet 1 tablet  1 tablet Oral QHS Mansy, Jan A, MD   1 tablet at 12/24/22 2107   sodium chloride flush (NS) 0.9 % injection 10 mL  10 mL Intravenous Q12H Mumma, Carollee Herter, MD   10 mL at 12/27/22 0926   torsemide (DEMADEX) tablet 20 mg  20 mg Oral Daily PRN Otelia Sergeant, RPH       traZODone (DESYREL) tablet 25 mg  25 mg Oral QHS PRN Mansy,  Jan A, MD   25 mg at 12/24/22 2106   vancomycin (VANCOCIN) capsule 125 mg  125 mg  Oral QID Lurene Shadow, MD   125 mg at 12/27/22 1351     Discharge Medications: Please see discharge summary for a list of discharge medications.  Relevant Imaging Results:  Relevant Lab Results:   Additional Information SS# [id number]  Marlowe Sax, RN

## 2022-12-28 DIAGNOSIS — K5792 Diverticulitis of intestine, part unspecified, without perforation or abscess without bleeding: Secondary | ICD-10-CM | POA: Diagnosis not present

## 2022-12-28 MED ORDER — METHYLPREDNISOLONE SODIUM SUCC 40 MG IJ SOLR
20.0000 mg | Freq: Once | INTRAMUSCULAR | Status: AC
  Administered 2022-12-28: 20 mg via INTRAVENOUS
  Filled 2022-12-28: qty 1

## 2022-12-28 MED ORDER — ALBUTEROL SULFATE (2.5 MG/3ML) 0.083% IN NEBU: 2.5000 mg | INHALATION_SOLUTION | Freq: Four times a day (QID) | RESPIRATORY_TRACT | Status: DC

## 2022-12-28 MED ORDER — SODIUM CHLORIDE 0.9 % IV SOLN
200.0000 mg | Freq: Once | INTRAVENOUS | Status: AC
  Administered 2022-12-28: 200 mg via INTRAVENOUS
  Filled 2022-12-28: qty 200

## 2022-12-28 MED ORDER — ACETAMINOPHEN 325 MG PO TABS
650.0000 mg | ORAL_TABLET | Freq: Three times a day (TID) | ORAL | Status: DC
  Administered 2022-12-28 – 2023-01-06 (×30): 650 mg via ORAL
  Filled 2022-12-28 (×30): qty 2

## 2022-12-28 MED ORDER — ALBUTEROL SULFATE (2.5 MG/3ML) 0.083% IN NEBU: 3.0000 mL | INHALATION_SOLUTION | Freq: Four times a day (QID) | RESPIRATORY_TRACT | Status: DC

## 2022-12-28 NOTE — Plan of Care (Signed)
  Problem: Education: Goal: Knowledge of General Education information will improve Description: Including pain rating scale, medication(s)/side effects and non-pharmacologic comfort measures Outcome: Progressing   Problem: Clinical Measurements: Goal: Ability to maintain clinical measurements within normal limits will improve Outcome: Progressing Goal: Will remain free from infection Outcome: Progressing Goal: Diagnostic test results will improve Outcome: Progressing Goal: Respiratory complications will improve Outcome: Progressing Goal: Cardiovascular complication will be avoided Outcome: Progressing   Problem: Activity: Goal: Risk for activity intolerance will decrease Outcome: Progressing   Problem: Nutrition: Goal: Adequate nutrition will be maintained Outcome: Progressing   Problem: Elimination: Goal: Will not experience complications related to bowel motility Outcome: Progressing Goal: Will not experience complications related to urinary retention Outcome: Progressing   Problem: Safety: Goal: Ability to remain free from injury will improve Outcome: Progressing

## 2022-12-28 NOTE — Progress Notes (Signed)
[facility]  - [place] [facility]   PATIENT NAME: Alexandria [name]    MR#:  [id number]  DATE OF BIRTH:  [birth year]  SUBJECTIVE:  patient sitting out in the recliner chair. Wants her Tylenol back which is ordered. Discussed with daughter Alexandria [name] on the phone in the room. Ordered vendor for infusion since she missed it last week. Discontinued Lovenox yesterday since patient has bruises on her legs per daughter's request. Overall improving slowly. Vitals stable.  VITALS:  Blood pressure (!) 129/41, pulse 65, temperature (!) 97.5 F (36.4 C), resp. rate 14, height 5\' 5"  (1.651 m), weight 99.3 kg, SpO2 100%.  PHYSICAL EXAMINATION:   GENERAL:  87 y.o.-year-old patient with no acute distress. Morbidly obese LUNGS: decreased breath sounds bilaterally, no wheezing CARDIOVASCULAR: S1, S2 normal. No murmur   ABDOMEN: Soft, diffuse tender, nondistended. No guarding rigidity EXTREMITIES: +  edema b/l.   Bruise 2 spots over the left leg with small blister NEUROLOGIC: nonfocal  patient is alert and awake, weak  LABORATORY PANEL:  CBC Recent Labs  Lab 12/27/22 0528  WBC 4.3  HGB 8.6*  HCT 28.7*  PLT 146*    Chemistries  Recent Labs  Lab 01/06/2023 1431 12/24/22 0200 12/27/22 0528  NA 142   < > 142  K 3.1*   < > 3.9  CL 99   < > 105  CO2 31   < > 29  GLUCOSE 165*   < > 104*  BUN 24*   < > 16  CREATININE 1.39*   < > 1.12*  CALCIUM 8.6*   < > 8.6*  MG  --   --  2.2  AST 16  --   --   ALT 16  --   --   ALKPHOS 102  --   --   BILITOT 0.6  --   --    < > = values in this interval not displayed.   Assessment and Plan  Alexandria [name] is a 87 y.o. female with medical history significant for anxiety, aortic stenosis, COPD, coronary artery disease, type 2 diabetes mellitus, atrial fibrillation, GERD, hypertension, dyslipidemia, hypothyroidism, OSA, CKD stage IIIb, peripheral neuropathy, obesity, who presented to the hospital with abdominal pain.  She said abdominal pain  has been going on intermittently for about 3 to 4 weeks.  She has also been having low back pain for about a couple of weeks that has slowly worsened.  There was also report of diarrhea.  Patient however said stools were not watery but reportedly she had bloodstained stool about 3 days prior to admission.  Stools were mostly greenish in color.  There was concern of C. difficile infection and apparently tested positive so she was started on oral vancomycin.   Acute diverticulitis:Was on IV ceftriaxone and metronidazole.  Start Augmentin from today  --Analgesics as needed for pain. --Gastric fold thickening on CT,? suspect  Gastritis: Continue Protonix. --consider GI consult if needed    Diarrhea: She reported some watery stools this morning, about 2 episodes.  It is not clear whether this is from IV antibiotics or C. difficile infection. --improving --recommend dairy products   C. difficile infection: No diarrhea so stool for C. difficile testing could not be done in the hospital.  -- Patient had a positive C. difficile toxin test on 12/22/2022.  Copy of report was faxed to the unit. Stool for occult blood, lactoferrin and C.diff molecular (toxin B) were positive.  Reportedly, patient was started on  oral vancomycin prior to coming to the hospital. -- Continue oral vancomycin with plan to treat for 10 days.   Acute UTI ruled out: Urine culture showed multiple species.    L1 compression fracture: Analgesics as needed for pain.  Patient declined L TSO brace.  She has been evaluated by neurosurgeon Dr Katrinka Blazing and no surgical intervention required.   --PT and OT evaluation--recs rehab    Chronic diastolic CHF: Compensated.  Torsemide on hold because of soft blood pressure and diarrhea.  Apparently blood pressure runs on the low side.         Chronic Anemia/IDA --hgb 8.6 --follows with hematology  --ok to give Venofer 200 mg x1 with pre-med solumederol per Consuello Masse   TOC for d/c planning     Family communication :[name] Consults :Neurosurgery CODE STATUS: FULL DVT Prophylaxis :lovenox refused by pt and dter--order discontinued Level of care: Med-Surg Status is: Inpatient Remains inpatient appropriate because: TOC for d/c planning. Medically best at baseline    TOTAL TIME TAKING CARE OF THIS PATIENT: 35 minutes.  >50% time spent on counselling and coordination of care  Note: This dictation was prepared with Dragon dictation along with smaller phrase technology. Any transcriptional errors that result from this process are unintentional.  Enedina Finner M.D    [facility]   CC: Primary care physician; Joaquim Nam, MD

## 2022-12-28 NOTE — TOC Progression Note (Signed)
Transition of Care [facility]) - Progression Note    Patient Details  Name: Alexandria [name] MRN: [id number] Date of Birth: May 02, 1935  [facility] CM/SW Contact  Marlowe Sax, RN Phone Number: 12/28/2022, 1:47 PM  Clinical Narrative:     Verne Spurr the daughter I reviewed the bed offers with her, she stated that those options are not an option, She stated that her only option it appears to go to [place] with [facility] PT, She will need the price of a hospital bed rental. I explaiuned that I will reach out to [employer] to get the price but before they will accept the order they will need to know where it will be delivered to be set up, she said that is not confirmed yet that first she will need a price,  reached out to [employer] [place]) and asked for the copay for rental, I will call the daughter once they get back to me with a price    Expected Discharge Plan: Skilled Nursing Facility Barriers to Discharge: SNF Pending bed offer  Expected Discharge Plan and Services   Discharge Planning Services: CM Consult   Living arrangements for the past 2 months: Assisted Living Facility                   DME Agency: NA       HH Arranged: NA           Social Determinants of Health (SDOH) Interventions SDOH Screenings   Food Insecurity: No Food Insecurity (12/24/2022)  Housing: Low Risk  (12/24/2022)  Transportation Needs: No Transportation Needs (12/24/2022)  Utilities: Not At Risk (12/24/2022)  Alcohol Screen: Low Risk  (10/21/2022)  Depression (PHQ2-9): Low Risk  (10/21/2022)  Recent Concern: Depression (PHQ2-9) - Medium Risk (08/03/2022)  Financial Resource Strain: Low Risk  (10/21/2022)  Physical Activity: Insufficiently Active (10/21/2022)  Social Connections: Socially Isolated (10/21/2022)  Stress: No Stress Concern Present (10/21/2022)  Tobacco Use: Medium Risk (12/21/2022)  Health Literacy: Adequate Health Literacy (10/21/2022)    Readmission Risk Interventions     11/18/2022    4:47 PM  Readmission Risk Prevention Plan  Transportation Screening Complete  Medication Review (RN Care Manager) Complete  HRI or Home Care Consult Complete  SW Recovery Care/Counseling Consult Complete  Palliative Care Screening Not Applicable  Skilled Nursing Facility Complete

## 2022-12-28 NOTE — Progress Notes (Signed)
Patient has Chronic CHF which requires upper body to be positioned in ways not feasible with a normal bed. Head must be elevated at least 45 degrees or she will experience difficulty breathing

## 2022-12-28 NOTE — TOC Progression Note (Signed)
Transition of Care [facility]) - Progression Note    Patient Details  Name: Alexandria [name] MRN: [id number] Date of Birth: [birth year]  [facility]) CM/SW Contact  Marlowe Sax, RN Phone Number: 12/28/2022, 10:58 AM  Clinical Narrative:   reached out to Ad[facility] at [facility] does not have a bed to offer,   Expected Discharge Plan: Skilled Nursing Facility Barriers to Discharge: SNF Pending bed offer  Expected Discharge Plan and Services   Discharge Planning Services: CM Consult   Living arrangements for the past 2 months: Assisted Living Facility                   DME Agency: NA       HH Arranged: NA           Social Determinants of Health (SDOH) Interventions SDOH Screenings   Food Insecurity: No Food Insecurity (12/24/2022)  Housing: Low Risk  (12/24/2022)  Transportation Needs: No Transportation Needs (12/24/2022)  Utilities: Not At Risk (12/24/2022)  Alcohol Screen: Low Risk  (10/21/2022)  Depression (PHQ2-9): Low Risk  (10/21/2022)  Recent Concern: Depression (PHQ2-9) - Medium Risk (08/03/2022)  Financial Resource Strain: Low Risk  (10/21/2022)  Physical Activity: Insufficiently Active (10/21/2022)  Social Connections: Socially Isolated (10/21/2022)  Stress: No Stress Concern Present (10/21/2022)  Tobacco Use: Medium Risk (12/21/2022)  Health Literacy: Adequate Health Literacy (10/21/2022)    Readmission Risk Interventions    11/18/2022    4:47 PM  Readmission Risk Prevention Plan  Transportation Screening Complete  Medication Review (RN Care Manager) Complete  HRI or Home Care Consult Complete  SW Recovery Care/Counseling Consult Complete  Palliative Care Screening Not Applicable  Skilled Nursing Facility Complete

## 2022-12-29 ENCOUNTER — Ambulatory Visit (HOSPITAL_BASED_OUTPATIENT_CLINIC_OR_DEPARTMENT_OTHER): Payer: Medicare Other | Admitting: Student

## 2022-12-29 DIAGNOSIS — K5792 Diverticulitis of intestine, part unspecified, without perforation or abscess without bleeding: Secondary | ICD-10-CM | POA: Diagnosis not present

## 2022-12-29 LAB — CULTURE, BLOOD (ROUTINE X 2)
Culture: NO GROWTH
Culture: NO GROWTH
Special Requests: ADEQUATE

## 2022-12-29 NOTE — Progress Notes (Signed)
[facility]  - [place] [facility]   PATIENT NAME: Alexandria [name]    MR#:  [id number]  DATE OF BIRTH:  [birth year]  SUBJECTIVE:  patient sitting out in the recliner chair. Spoke with [name] on the phone in the room--questions answered Dter will bring home CPAP for pt to use  VITALS:  Blood pressure (!) 144/42, pulse 64, temperature 98 F (36.7 C), resp. rate 18, height 5\' 5"  (1.651 m), weight 99.3 kg, SpO2 94%.  PHYSICAL EXAMINATION:   GENERAL:  87 y.o.-year-old patient with no acute distress. Morbidly obese LUNGS: decreased breath sounds bilaterally, no wheezing CARDIOVASCULAR: S1, S2 normal.  ABDOMEN: Soft, diffuse tender, nondistended. No guarding rigidity EXTREMITIES: +  edema b/l.   Bruise 2 spots over the left leg with small blister NEUROLOGIC: nonfocal  patient is alert and awake, weak  LABORATORY PANEL:  CBC Recent Labs  Lab 12/27/22 0528  WBC 4.3  HGB 8.6*  HCT 28.7*  PLT 146*    Chemistries  Recent Labs  Lab 12/28/2022 1431 12/24/22 0200 12/27/22 0528  NA 142   < > 142  K 3.1*   < > 3.9  CL 99   < > 105  CO2 31   < > 29  GLUCOSE 165*   < > 104*  BUN 24*   < > 16  CREATININE 1.39*   < > 1.12*  CALCIUM 8.6*   < > 8.6*  MG  --   --  2.2  AST 16  --   --   ALT 16  --   --   ALKPHOS 102  --   --   BILITOT 0.6  --   --    < > = values in this interval not displayed.   Assessment and Plan  Alexandria [name] is a 87 y.o. female with medical history significant for anxiety, aortic stenosis, COPD, coronary artery disease, type 2 diabetes mellitus, atrial fibrillation, GERD, hypertension, dyslipidemia, hypothyroidism, OSA, CKD stage IIIb, peripheral neuropathy, obesity, who presented to the hospital with abdominal pain.  She said abdominal pain has been going on intermittently for about 3 to 4 weeks.  She has also been having low back pain for about a couple of weeks that has slowly worsened.  There was also report of diarrhea.  Patient however  said stools were not watery but reportedly she had bloodstained stool about 3 days prior to admission.  Stools were mostly greenish in color.  There was concern of C. difficile infection and apparently tested positive so she was started on oral vancomycin.   Acute diverticulitis:Was on IV ceftriaxone and metronidazole.  Start Augmentin from today  --Analgesics as needed for pain. --Gastric fold thickening on CT,? suspect  Gastritis: Continue Protonix. --consider GI consult if needed   C. difficile infection: No diarrhea so stool for C. difficile testing could not be done in the hospital.  -- Patient had a positive C. difficile toxin test on 12/22/2022.  Copy of report was faxed to the unit. Stool for occult blood, lactoferrin and C.diff molecular (toxin B) were positive.  Reportedly, patient was started on oral vancomycin prior to coming to the hospital. -- Continue oral vancomycin with plan to treat for 10 days.   Acute UTI ruled out: Urine culture showed multiple species.    L1 compression fracture: Analgesics as needed for pain.  Patient declined L TSO brace.  She has been evaluated by neurosurgeon Dr Katrinka Blazing and no surgical intervention required.   --  PT and OT evaluation--recs rehab    Chronic respiratory failure /chronic COPD Chronic diastolic CHF: Compensated.   --cont present meds -pt to use home CPAP  Chronic Anemia/IDA --hgb 8.6 --follows with hematology  --ok to give Venofer 200 mg x1 with pre-med solumederol per Consuello Masse   TOC for d/c planning    Family communication :[name] Consults :Neurosurgery CODE STATUS: FULL DVT Prophylaxis :lovenox refused by pt and dter--order discontinued Level of care: Med-Surg Status is: Inpatient Remains inpatient appropriate because: TOC for d/c planning. Medically best at baseline    TOTAL TIME TAKING CARE OF THIS PATIENT: 35 minutes.  >50% time spent on counselling and coordination of care  Note: This dictation was prepared with  Dragon dictation along with smaller phrase technology. Any transcriptional errors that result from this process are unintentional.  Enedina Finner M.D    [facility]   CC: Primary care physician; Joaquim Nam, MD

## 2022-12-29 NOTE — Progress Notes (Signed)
Physical Therapy Treatment Patient Details Name: ABERDEEN MESCALL MRN: [id number] DOB: 1935/11/30 Today's Date: 12/29/2022   History of Present Illness Alexandria [name] is a 87 y.o. female with medical history significant for anxiety, aortic stenosis, COPD, CAD, Type II DM, AFIB, GERD, HTN, dyslipidemia, hypothyroidism, OSA, CKD stage IIIb, peripheral neuropathy, obesity, who presented to the hospital with abdominal pain.  Positive for C. Diff, compression fx of L1 vertebra (TLSO for comfort) and CHF.    PT Comments  Patient sitting in recliner on arrival and wanting to utilize [facility]. Able to stand from recliner with momentum and CGA. Ambulated within room with rollator and CGA. Requires totalA for pericare after BSC use. 2/4 DOE. Required minA to return to bed. Discharge plan remains appropriate.    If plan is discharge home, recommend the following: A little help with walking and/or transfers;A little help with bathing/dressing/bathroom;Assistance with cooking/housework;Help with stairs or ramp for entrance;Assist for transportation   Can travel by private vehicle     Yes  Equipment Recommendations  Other (comment) (TBD)    Recommendations for Other Services       Precautions / Restrictions Precautions Precautions: Fall Precaution Comments: watch O2 Restrictions Weight Bearing Restrictions: No     Mobility  Bed Mobility Overal bed mobility: Needs Assistance Bed Mobility: Sit to Supine       Sit to supine: Min assist   General bed mobility comments: in recliner on arrival. Assist for LE management back into bed    Transfers Overall transfer level: Needs assistance Equipment used: Rolling [name] (2 wheels) Transfers: Sit to/from Stand Sit to Stand: Contact guard assist           General transfer comment: use of momentum to come into standing from recliner and BSC    Ambulation/Gait Ambulation/Gait assistance: Contact guard assist Gait Distance (Feet): 15 Feet  (+10') Assistive device: Rolling [name] (2 wheels) Gait Pattern/deviations: Step-through pattern, Decreased step length - right, Decreased step length - left, Decreased stride length Gait velocity: decreased     General Gait Details: ambulated within room to [facility] and then to bed. 2/4 DOE   Building services engineer Rankin (Stroke Patients Only)       Balance Overall balance assessment: Needs assistance Sitting-balance support: Feet supported, Single extremity supported Sitting balance-[name]: Good     Standing balance support: During functional activity, Reliant on assistive device for balance, Bilateral upper extremity supported Standing balance-[name]: Fair                              Cognition Arousal: Alert Behavior During Therapy: WFL for tasks assessed/performed Overall Cognitive Status: No family/caregiver present to determine baseline cognitive functioning                                          Exercises      General Comments        Pertinent Vitals/Pain Pain Assessment Pain Assessment: No/denies pain    Home Living                          Prior Function            PT  Goals (current goals can now be found in the care plan section) Acute Rehab PT Goals PT Goal Formulation: With patient Time For Goal Achievement: 01/10/23 Potential to Achieve Goals: Fair Progress towards PT goals: Progressing toward goals    Frequency    Min 1X/week      PT Plan      Co-evaluation              AM-PAC PT "6 Clicks" Mobility   Outcome Measure  Help needed turning from your back to your side while in a flat bed without using bedrails?: A Little Help needed moving from lying on your back to sitting on the side of a flat bed without using bedrails?: A Little Help needed moving to and from a bed to a chair (including a wheelchair)?: A Little Help needed  standing up from a chair using your arms (e.g., wheelchair or bedside chair)?: A Little Help needed to walk in hospital room?: A Little Help needed climbing 3-5 steps with a railing? : A Lot 6 Click Score: 17    End of Session Equipment Utilized During Treatment: Oxygen Activity Tolerance: Patient tolerated treatment well Patient left: in bed;with call bell/phone within reach;with bed alarm set Nurse Communication: Mobility status PT Visit Diagnosis: Unsteadiness on feet (R26.81);Muscle weakness (generalized) (M62.81)     Time: 5284-1324 PT Time Calculation (min) (ACUTE ONLY): 15 min  Charges:    $Therapeutic Activity: 8-22 mins PT General Charges $$ ACUTE PT VISIT: 1 Visit                     Maylon Peppers, PT, DPT Physical Therapist - [facility]  [facility]    [name] 12/29/2022, 4:20 PM

## 2022-12-29 NOTE — Plan of Care (Signed)
  Problem: Education: Goal: Knowledge of General Education information will improve Description: Including pain rating scale, medication(s)/side effects and non-pharmacologic comfort measures 12/29/2022 1513 by Danella Sensing, RN Outcome: Progressing 12/29/2022 1513 by Danella Sensing, RN Outcome: Progressing   Problem: Health Behavior/Discharge Planning: Goal: Ability to manage health-related needs will improve 12/29/2022 1513 by Danella Sensing, RN Outcome: Progressing 12/29/2022 1513 by Danella Sensing, RN Outcome: Progressing   Problem: Clinical Measurements: Goal: Ability to maintain clinical measurements within normal limits will improve 12/29/2022 1513 by Danella Sensing, RN Outcome: Progressing 12/29/2022 1513 by Danella Sensing, RN Outcome: Progressing Goal: Will remain free from infection 12/29/2022 1513 by Danella Sensing, RN Outcome: Progressing 12/29/2022 1513 by Danella Sensing, RN Outcome: Progressing Goal: Diagnostic test results will improve Outcome: Progressing Goal: Respiratory complications will improve Outcome: Progressing Goal: Cardiovascular complication will be avoided Outcome: Progressing   Problem: Activity: Goal: Risk for activity intolerance will decrease Outcome: Progressing   Problem: Coping: Goal: Level of anxiety will decrease Outcome: Progressing   Problem: Nutrition: Goal: Adequate nutrition will be maintained Outcome: Progressing   Problem: Elimination: Goal: Will not experience complications related to bowel motility Outcome: Progressing Goal: Will not experience complications related to urinary retention Outcome: Progressing   Problem: Pain Managment: Goal: General experience of comfort will improve Outcome: Progressing   Problem: Safety: Goal: Ability to remain free from injury will improve Outcome: Progressing   Problem: Skin Integrity: Goal: Risk for impaired skin integrity will  decrease Outcome: Progressing

## 2022-12-29 NOTE — Plan of Care (Signed)
  Problem: Health Behavior/Discharge Planning: Goal: Ability to manage health-related needs will improve Outcome: Progressing   Problem: Clinical Measurements: Goal: Ability to maintain clinical measurements within normal limits will improve Outcome: Progressing Goal: Diagnostic test results will improve Outcome: Progressing Goal: Respiratory complications will improve Outcome: Progressing Goal: Cardiovascular complication will be avoided Outcome: Progressing   Problem: Activity: Goal: Risk for activity intolerance will decrease Outcome: Progressing   Problem: Nutrition: Goal: Adequate nutrition will be maintained Outcome: Progressing   Problem: Coping: Goal: Level of anxiety will decrease Outcome: Progressing   Problem: Elimination: Goal: Will not experience complications related to bowel motility Outcome: Progressing Goal: Will not experience complications related to urinary retention Outcome: Progressing   Problem: Pain Managment: Goal: General experience of comfort will improve Outcome: Progressing

## 2022-12-30 ENCOUNTER — Telehealth: Payer: Self-pay | Admitting: Internal Medicine

## 2022-12-30 ENCOUNTER — Encounter: Payer: Self-pay | Admitting: Family Medicine

## 2022-12-30 DIAGNOSIS — K5792 Diverticulitis of intestine, part unspecified, without perforation or abscess without bleeding: Secondary | ICD-10-CM | POA: Diagnosis not present

## 2022-12-30 LAB — HEMOGLOBIN: Hemoglobin: 8.3 g/dL — ABNORMAL LOW (ref 12.0–15.0)

## 2022-12-30 NOTE — Telephone Encounter (Signed)
Daughter called to let us know patient is in hospital. She wants to cancel appt on Monday 01/03/2023.

## 2022-12-30 NOTE — TOC Progression Note (Signed)
Transition of Care [facility]) - Progression Note    Patient Details  Name: Alexandria [name] MRN: [id number] Date of Birth: 1935/08/17  Transition of Care [facility]) CM/SW Contact  Hetty Ely, RN Phone Number: 12/30/2022, 4:47 PM  Clinical Narrative: Daughter given Hospital bed costs per [facility] with [employer], $150/mo or $1000 paid in full for semi-electric bed. Daughter concerned about having to manually controll the bed, contacted [facility] and informed the bed was controlled with a controll panel and they will install it. Attempted to call daughter with information, [name] [phone] no answer, no voice mail. [employer] given discharge information for T[facility]. [facility], who to contact to arrange delivery. ALF forms received and completed by MD and faxed.      Expected Discharge Plan: Skilled Nursing Facility Barriers to Discharge: SNF Pending bed offer  Expected Discharge Plan and Services   Discharge Planning Services: CM Consult   Living arrangements for the past 2 months: Assisted Living Facility                   DME Agency: NA       HH Arranged: NA           Social Determinants of Health (SDOH) Interventions SDOH Screenings   Food Insecurity: No Food Insecurity (12/24/2022)  Housing: Low Risk  (12/24/2022)  Transportation Needs: No Transportation Needs (12/24/2022)  Utilities: Not At Risk (12/24/2022)  Alcohol Screen: Low Risk  (10/21/2022)  Depression (PHQ2-9): Low Risk  (10/21/2022)  Recent Concern: Depression (PHQ2-9) - Medium Risk (08/03/2022)  Financial Resource Strain: Low Risk  (10/21/2022)  Physical Activity: Insufficiently Active (10/21/2022)  Social Connections: Socially Isolated (10/21/2022)  Stress: No Stress Concern Present (10/21/2022)  Tobacco Use: Medium Risk (12/30/2022)  Health Literacy: Adequate Health Literacy (10/21/2022)    Readmission Risk Interventions    12/28/2022    3:01 PM 11/18/2022    4:47 PM  Readmission Risk Prevention Plan  Transportation  Screening Complete Complete  Medication Review (RN Care Manager) Complete Complete  PCP or Specialist appointment within 3-5 days of discharge Complete   HRI or Home Care Consult Complete Complete  SW Recovery Care/Counseling Consult Complete Complete  Palliative Care Screening Not Applicable Not Applicable  Skilled Nursing Facility Complete Complete

## 2022-12-30 NOTE — Progress Notes (Signed)
PT Cancellation Note  Patient Details Name: KIEARA KOVALSKY MRN: [id number] DOB: [birth year]   Cancelled Treatment:    Reason Eval/Treat Not Completed: Other (comment). Pt in recliner following mobility with NT and asked for PT to come back another time. Will attempt PT visit at a later time.   Sharonne Ricketts 12/30/2022, 2:04 PM

## 2022-12-30 NOTE — Plan of Care (Signed)
  Problem: Education: Goal: Knowledge of General Education information will improve Description: Including pain rating scale, medication(s)/side effects and non-pharmacologic comfort measures Outcome: Progressing   Problem: Health Behavior/Discharge Planning: Goal: Ability to manage health-related needs will improve Outcome: Progressing   Problem: Clinical Measurements: Goal: Respiratory complications will improve Outcome: Progressing Goal: Cardiovascular complication will be avoided Outcome: Progressing   Problem: Activity: Goal: Risk for activity intolerance will decrease Outcome: Progressing   Problem: Coping: Goal: Level of anxiety will decrease Outcome: Progressing   

## 2022-12-30 NOTE — Progress Notes (Signed)
[facility]  - [place] at [facility]   PATIENT NAME: Alexandria [name]    MR#:  [id number]  DATE OF BIRTH:  December 09, 1935  SUBJECTIVE:  patient laying in bed today. Daughter reports patient had three bowel movements yesterday. None documented by nighttime staff. Patient has caregiver who stays in the room. Patient appears fatigued and tired. Did eat breakfast. No Alexandria [name] stools today. Met with daughter tinea in the room. Questions answered. Patient tried to use her CPAP for one hour last night. Encouraged to use more. VITALS:  Blood pressure (!) 163/74, pulse (!) 57, temperature 98 F (36.7 C), resp. rate 16, height 5\' 5"  (1.651 m), weight 99.3 kg, SpO2 96%.  PHYSICAL EXAMINATION:   GENERAL:  87 y.o.-year-old patient with no acute distress. Morbidly obese LUNGS: decreased breath sounds bilaterally, no wheezing CARDIOVASCULAR: S1, S2 normal.  ABDOMEN: Soft, diffuse tender, nondistended. No guarding rigidity EXTREMITIES: +  edema b/l.   Bruise 2 spots over the left leg with small blister NEUROLOGIC: nonfocal  patient is alert and awake, weak  LABORATORY PANEL:  CBC Recent Labs  Lab 12/27/22 0528 12/30/22 1122  WBC 4.3  --   HGB 8.6* 8.3*  HCT 28.7*  --   PLT 146*  --     Chemistries  Recent Labs  Lab 12/12/2022 1431 12/24/22 0200 12/27/22 0528  NA 142   < > 142  K 3.1*   < > 3.9  CL 99   < > 105  CO2 31   < > 29  GLUCOSE 165*   < > 104*  BUN 24*   < > 16  CREATININE 1.39*   < > 1.12*  CALCIUM 8.6*   < > 8.6*  MG  --   --  2.2  AST 16  --   --   ALT 16  --   --   ALKPHOS 102  --   --   BILITOT 0.6  --   --    < > = values in this interval not displayed.   Assessment and Plan  Alexandria [name] is a 87 y.o. female with medical history significant for anxiety, aortic stenosis, COPD, coronary artery disease, type 2 diabetes mellitus, atrial fibrillation, GERD, hypertension, dyslipidemia, hypothyroidism, OSA, CKD stage IIIb, peripheral neuropathy, obesity, who  presented to the hospital with abdominal pain.  She said abdominal pain has been going on intermittently for about 3 to 4 weeks.  She has also been having low back pain for about a couple of weeks that has slowly worsened.  There was also report of diarrhea.  Patient however said stools were not watery but reportedly she had bloodstained stool about 3 days prior to admission.  Stools were mostly greenish in color.  There was concern of C. difficile infection and apparently tested positive so she was started on oral vancomycin.   Acute diverticulitis:Was on IV ceftriaxone and metronidazole.   --completed  Augmentin   --Analgesics as needed for pain. --Gastric fold thickening on CT suspect  Gastritis: Continue Protonix.   C. difficile infection: No diarrhea so stool for C. difficile testing could not be done in the hospital.  -- Patient had a positive C. difficile toxin test on 12/22/2022.  Copy of report was faxed to the unit. Stool for occult blood, lactoferrin and C.diff molecular (toxin B) were positive.  Reportedly, patient was started on oral vancomycin prior to coming to the hospital. -- Continue oral vancomycin with plan to treat for 10 days.  Acute UTI ruled out: Urine culture showed multiple species.    L1 compression fracture: Analgesics as needed for pain.  Patient declined L TSO brace.  She has been evaluated by neurosurgeon Dr Katrinka Blazing and no surgical intervention required.   --PT and OT evaluation--recs rehab    Chronic respiratory failure /chronic COPD Chronic diastolic CHF: Compensated.   --cont present meds -pt to use home CPAP--pt does not like to use much  Chronic Anemia/IDA --hgb 8.3 --follows with hematology  --ok to give Venofer 200 mg x1 with pre-med solumederol per Consuello Masse   TOC for d/c planning to ALF. Per daughter she is working on Education officer, environmental bed for pt. She would like pt to stay till Monday   Family communication :Alexandria [name] Consults :Neurosurgery CODE  STATUS: FULL DVT Prophylaxis :lovenox  declined by pt and dter--order discontinued Level of care: Med-Surg Status is: Inpatient TOC for d/c planning.    TOTAL TIME TAKING CARE OF THIS PATIENT: 35 minutes.  >50% time spent on counselling and coordination of care  Note: This dictation was prepared with Dragon dictation along with smaller phrase technology. Any transcriptional errors that result from this process are unintentional.  Enedina Finner M.D    [facility]   CC: Primary care physician; Joaquim Nam, MD

## 2022-12-31 ENCOUNTER — Inpatient Hospital Stay: Payer: Medicare Other

## 2022-12-31 ENCOUNTER — Ambulatory Visit: Payer: Medicare Other | Admitting: Gastroenterology

## 2022-12-31 DIAGNOSIS — A0472 Enterocolitis due to Clostridium difficile, not specified as recurrent: Secondary | ICD-10-CM | POA: Diagnosis not present

## 2022-12-31 DIAGNOSIS — K5792 Diverticulitis of intestine, part unspecified, without perforation or abscess without bleeding: Secondary | ICD-10-CM | POA: Diagnosis not present

## 2022-12-31 LAB — BLOOD GAS, VENOUS
Acid-Base Excess: 5.2 mmol/L — ABNORMAL HIGH (ref 0.0–2.0)
Bicarbonate: 33.7 mmol/L — ABNORMAL HIGH (ref 20.0–28.0)
O2 Saturation: 62.5 %
Patient temperature: 36.7
pCO2, Ven: 66 mm[Hg] — ABNORMAL HIGH (ref 44–60)
pH, Ven: 7.31 (ref 7.25–7.43)
pO2, Ven: 39 mm[Hg] (ref 32–45)

## 2022-12-31 NOTE — TOC Progression Note (Signed)
Transition of Care [facility]) - Progression Note    Patient Details  Name: Alexandria [name] MRN: [id number] Date of Birth: 26-Mar-1935  Transition [facility] Care [facility]) CM/SW Contact  Margarito Liner, LCSW Phone Number: 12/31/2022, 4:50 PM  Clinical Narrative:  [facility] can no longer offer patient a bed. Daughter asked for information on skilled nursing facilities. CSW sent her the link to the CMS website. Patient's CPAP is from [facility] [phone]). This is the CPAP machine that [facility] had ordered for her prior to her admission there and both [facility] and [facility] said she would have to return it. Daughter stated she has not had a sleep study and is unable to do the test.    Expected Discharge Plan: Skilled Nursing Facility Barriers to Discharge: SNF Pending bed offer  Expected Discharge Plan and Services   Discharge Planning Services: CM Consult   Living arrangements for the past 2 months: Assisted Living Facility                   DME Agency: NA       HH Arranged: NA           Social Determinants of Health (SDOH) Interventions SDOH Screenings   Food Insecurity: No Food Insecurity (12/24/2022)  Housing: Low Risk  (12/24/2022)  Transportation Needs: No Transportation Needs (12/24/2022)  Utilities: Not At Risk (12/24/2022)  Alcohol Screen: Low Risk  (10/21/2022)  Depression (PHQ2-9): Low Risk  (10/21/2022)  Recent Concern: Depression (PHQ2-9) - Medium Risk (08/03/2022)  Financial Resource Strain: Low Risk  (10/21/2022)  Physical Activity: Insufficiently Active (10/21/2022)  Social Connections: Socially Isolated (10/21/2022)  Stress: No Stress Concern Present (10/21/2022)  Tobacco Use: Medium Risk (12/30/2022)  Health Literacy: Adequate Health Literacy (10/21/2022)    Readmission Risk Interventions    12/28/2022    3:01 PM 11/18/2022    4:47 PM  Readmission Risk Prevention Plan  Transportation Screening Complete Complete  Medication Review (RN  Care Manager) Complete Complete  PCP or Specialist appointment within 3-5 days of discharge Complete   HRI or Home Care Consult Complete Complete  SW Recovery Care/Counseling Consult Complete Complete  Palliative Care Screening Not Applicable Not Applicable  Skilled Nursing Facility Complete Complete

## 2022-12-31 NOTE — Progress Notes (Signed)
Patient placed on CPAP at 5cm H2O with 3L bled in. Pt is tolerating well at this time. RN and MD aware.

## 2022-12-31 NOTE — Progress Notes (Signed)
Occupational Therapy Treatment Patient Details Name: Alexandria [name] MRN: [id number] DOB: [birth year] Today's Date: 12/31/2022   History of present illness Alexandria [name] is a 87 y.o. female with medical history significant for anxiety, aortic stenosis, COPD, CAD, Type II DM, AFIB, GERD, HTN, dyslipidemia, hypothyroidism, OSA, CKD stage IIIb, peripheral neuropathy, obesity, who presented to the hospital with abdominal pain.  Positive for C. Diff, compression fx of L1 vertebra (TLSO for comfort) and CHF.   OT comments  Chart reviewed prior to tx session. Pt seen for OT treatment on this date. Upon arrival to room pt was asleep in bed, awaken to verbal/tactile cues. Tx session targeted improved ADL activity tolerance and mobility. Pt was mildly alert and oriented x4. Bed mobility; Pt requires MINA supine <> sit, sit <> supine MODA +2 for BLE and trunk management. Pt seated on EOB, completing oral care and face washing with MIN verbal cues for task completion. Pt required MINA for STS, tolerated standing for >1 minute refused further mobility dispite verbal encouragement. Pt  Pt oxygen level monitored through session, Spo2 90s throughout session on 2L via [place]. Pt making progress toward goals, will continue to follow POC. Discharge recommendation remains appropriate. OT will follow acutely.           If plan is discharge home, recommend the following:  A little help with walking and/or transfers;A little help with bathing/dressing/bathroom;Assistance with cooking/housework;Assist for transportation;Help with stairs or ramp for entrance;Direct supervision/assist for financial management;Direct supervision/assist for medications management   Equipment Recommendations  Other (comment) (Next venue of care)    Recommendations for Other Services      Precautions / Restrictions Precautions Precautions: Fall Precaution Comments: watch O2 Restrictions Weight Bearing Restrictions: No       Mobility  Bed Mobility Overal bed mobility: Needs Assistance Bed Mobility: Supine to Sit, Sit to Sidelying, Rolling Rolling: Mod assist   Supine to sit: Mod assist Sit to supine: Mod assist, +2 for physical assistance        Transfers Overall transfer level: Needs assistance Equipment used: Rollator (4 wheels) Transfers: Sit to/from Stand Sit to Stand: Min assist, +2 physical assistance           General transfer comment: Will continue to assess; on this date Pt refusing OOB mobility     Balance Overall balance assessment: Needs assistance Sitting-balance support: Feet supported, Single extremity supported Sitting balance-[name]: Good Sitting balance - Comments: static sitting balance assessed during seated grooming task at EOB   Standing balance support: Reliant on assistive device for balance, During functional activity, Bilateral upper extremity supported   Standing balance comment: Stood for ~1 minute refused further mobility dispite verbal encouragment                           ADL either performed or assessed with clinical judgement   ADL Overall ADL's : Needs assistance/impaired     Grooming: Wash/dry face;Sitting;Minimal assistance;Oral care Grooming Details (indicate cue type and reason): EOB - verbal cues for task completion        LB dressing: MAXA                             Extremity/Trunk Assessment              Vision       Perception     Praxis      Cognition Arousal:  Alert Behavior During Therapy: Flat affect Overall Cognitive Status: No family/caregiver present to determine baseline cognitive functioning Area of Impairment: Problem solving, Safety/judgement                         Safety/Judgement: Decreased awareness of safety, Decreased awareness of deficits   Problem Solving: Slow processing, Decreased initiation General Comments: Pt asleep upon entry to room, gross tactile/verbal cues to wake, and  frequent cues to keep eyes open        Exercises      Shoulder Instructions       General Comments SpO2 in 90s throughout session, 2L via [place]    Pertinent Vitals/ Pain       Pain Assessment Pain Assessment: No/denies pain  Home Living                                          Prior Functioning/Environment              Frequency  Min 1X/week        Progress Toward Goals  OT Goals(current goals can now be found in the care plan section)        Plan      Co-evaluation                 AM-PAC OT "6 Clicks" Daily Activity     Outcome Measure   Help from another person eating meals?: None Help from another person taking care of personal grooming?: A Little Help from another person toileting, which includes using toliet, bedpan, or urinal?: A Little Help from another person bathing (including washing, rinsing, drying)?: A Little Help from another person to put on and taking off regular upper body clothing?: A Little Help from another person to put on and taking off regular lower body clothing?: A Lot 6 Click Score: 18    End of Session Equipment Utilized During Treatment: Gait belt;Oxygen;Rollator (4 wheels)  OT Visit Diagnosis: Unsteadiness on feet (R26.81);Muscle weakness (generalized) (M62.81);History of falling (Z91.81)   Activity Tolerance Patient tolerated treatment well   Patient Left with call bell/phone within reach;in bed;with bed alarm set   Nurse Communication Mobility status (vitals)        Time: 6440-3474 OT Time Calculation (min): 34 min  Charges: OT General Charges $OT Visit: 1 Visit OT Treatments $Self Care/Home Management : 8-22 mins $Therapeutic Activity: 8-22 mins  [employer], OTS

## 2022-12-31 NOTE — Consult Note (Signed)
PULMONOLOGY         Date: 12/31/2022,   MRN# [id number] Alexandria [name] Aug 01, 1935     AdmissionWeight: 99.3 kg                 CurrentWeight: 99.3 kg  Referring provider: Dr Denton Lank   CHIEF COMPLAINT:   Acute on chronic hypoxemic respiratory failure   HISTORY OF PRESENT ILLNESS    Patient went to pulmonologist and went to have APAP mask changed but was unable to get correct interface.  She is not able to tolerate current mask interface.  Alexandria [name] her daughter shares that CO2 levels have been increased and causes her to have encephalopathy.  She also notes claustrophobia with full face mask.   PAST MEDICAL HISTORY   Past Medical History:  Diagnosis Date   Allergy, unspecified not elsewhere classified    Anxiety    Aortic stenosis, mild 03/30/2022   a. TTE 03/29/2022: EF 55-60, no RWMA, GR 1 DD, normal RVSF, mild aortic stenosis (mean 10, V-max 212 cm/s, DI 0.70)   Cataract    Chronic diastolic congestive heart failure (HCC)    a. 06/2022 Echo: EF 60-65%, no rwma, nl RV fxn, mild MR, mild AS.   COPD (chronic obstructive pulmonary disease) (HCC) 03/08/1998   PFTs 12/12/2002 FEV 1 1.42 (64%) ratio 58 with no better after B2 and DLCO75%; PFTs 11/20/09 FEV1 1.50 (73%) ratio 50 no better after B2 with DLCO 62%; Hfa 50% 11/20/2009 >75%, 01/13/10 p coaching   Coronary artery calcification seen on CT scan    a. 2012 Neg stress test.   Depression 12/07/1998   Diabetes mellitus type II 02/05/2006   Dr. Elvera Lennox with endo   Disorders of bursae and tendons in shoulder region, unspecified    Rotator cuff syndrome, right   E. coli bacteremia    Esophagitis    GERD (gastroesophageal reflux disease)    History of UTI    Hyperlipidemia 10/04/2000   Hypertension 03/08/1992   Hypothyroidism 03/08/1968   Iron deficiency anemia    Lung nodule    radiation starts 10-08-2019   Malignant neoplasm of right upper lobe of lung (HCC) 07/24/2019   Obesity    NOS   OSA (obstructive sleep  apnea)    PSG 01/27/10 AHI 13, pt does not know CPAP settings   PAF (paroxysmal atrial fibrillation) (HCC)    a. 06/2022 in setting of UTI/encephalopathy-->converted on amio; b. CHA2DS2VASc = 6-->No OAC 2/2 chronic anemia/fall risk; c. 06/2022 Zio: Predominantly sinus bradycardia at 54 (3-158)'s.  1 brief run of SVT.  2.2% PVC burden.   Peripheral neuropathy    Likely due to DM per Dr. Sandria Manly   Renal lesion 12/22/2022   Ventral hernia      SURGICAL HISTORY   Past Surgical History:  Procedure Laterality Date   ABD U/S  03/19/1999   Nml x2 foci in liver   ADENOSINE MYOVIEW  06/02/2007   Nml   CARDIOLITE PERSANTINE  08/24/2000   Nml   CAROTID U/S  08/24/2000   1-39% ICA stenosis   CAROTID U/S  06/02/2007   No apprec change    CARPAL TUNNEL RELEASE  12/1997   Right   CESAREAN SECTION     x2 Breech/ repeat   CHOLECYSTECTOMY  1997   COLONOSCOPY WITH PROPOFOL N/A 02/12/2016   Procedure: COLONOSCOPY WITH PROPOFOL;  Surgeon: Rachael Fee, MD;  Location: [facility];  Service: Endoscopy;  Laterality: N/A;   CT ABD W &  PELVIS WO/W CM     Abd hemangiomas of liver, 1 cm R renal cyst   DENTAL SURGERY  2016   Implants   DEXA  07/03/2003   Nml   ESOPHAGOGASTRODUODENOSCOPY  12/05/1997   Nml (due to hoarseness)   ESOPHAGOGASTRODUODENOSCOPY (EGD) WITH PROPOFOL N/A 02/12/2016   Procedure: ESOPHAGOGASTRODUODENOSCOPY (EGD) WITH PROPOFOL;  Surgeon: Rachael Fee, MD;  Location: [facility];  Service: Endoscopy;  Laterality: N/A;   GALLBLADDER SURGERY     HERNIA REPAIR  01/24/2009   Lap Ventr w/ Lysis of adhesions (Dr. Dwain Sarna)   knee arthroscopic surgery  years ago   right   ROTATOR CUFF REPAIR  1984   Right, [place]   SHOULDER OPEN ROTATOR CUFF REPAIR  02/08/2012   Procedure: ROTATOR CUFF REPAIR SHOULDER OPEN;  Surgeon: Drucilla Schmidt, MD;  Location: [facility];  Service: Orthopedics;  Laterality: Left;  Left Shoulder Open Anterior Acrominectomy Rotator Cuff Repair Open Distal  Clavicle Resection ,tissue mend graft, and repair of biceps tendon   THUMB RELEASE  12/1997   Right   TONSILLECTOMY     TOTAL ABDOMINAL HYSTERECTOMY  1985   Due to dysmennorhea   US ECHOCARDIOGRAPHY  06/02/2007     FAMILY HISTORY   Family History  Problem Relation Age of Onset   Heart disease Mother    Thyroid disease Mother    Emphysema Father        One lung   Cystic fibrosis Sister    Hyperthyroidism Sister    Osteoarthritis Brother    Hyperthyroidism Brother    Esophageal cancer Neg Hx    Cancer Neg Hx        Head or neck   Colon cancer Neg Hx    Stomach cancer Neg Hx    Breast cancer Neg Hx      SOCIAL HISTORY   Social History   Tobacco Use   Smoking status: Former    Current packs/day: 0.00    Average packs/day: 2.0 packs/day for 50.0 years (100.0 ttl pk-yrs)    Types: Cigarettes    Start date: 02/06/1955    Quit date: 02/05/2005    Years since quitting: 17.9   Smokeless tobacco: Never  Vaping Use   Vaping status: Never Used  Substance Use Topics   Alcohol use: Yes    Comment: occasionally   Drug use: Never     MEDICATIONS    Home Medication:     Current Medication:  Current Facility-Administered Medications:    acetaminophen (TYLENOL) tablet 650 mg, 650 mg, Oral, TID, Enedina Finner, MD, 650 mg at 12/31/22 1018   albuterol (PROVENTIL) (2.5 MG/3ML) 0.083% nebulizer solution 3 mL, 3 mL, Inhalation, Q6H PRN, Mansy, Jan A, MD, 3 mL at 12/28/22 1305   amiodarone (PACERONE) tablet 200 mg, 200 mg, Oral, Daily, Mansy, Jan A, MD, 200 mg at 12/31/22 1018   arformoterol (BROVANA) nebulizer solution 15 mcg, 15 mcg, Nebulization, QHS, Mansy, Jan A, MD, 15 mcg at 12/30/22 2142   budesonide (PULMICORT) nebulizer solution 0.5 mg, 0.5 mg, Nebulization, BID, Mansy, Jan A, MD, 0.5 mg at 12/31/22 0759   busPIRone (BUSPAR) tablet 15 mg, 15 mg, Oral, TID, Mansy, Jan A, MD, 15 mg at 12/31/22 1018   cholecalciferol (VITAMIN D3) 25 MCG (1000 UNIT) tablet 2,000 Units,  2,000 Units, Oral, Daily, Mansy, Jan A, MD, 2,000 Units at 12/31/22 1018   cyanocobalamin (VITAMIN B12) tablet 2,000 mcg, 2,000 mcg, Oral, Daily, Mansy, Jan A, MD, 2,000 mcg at 12/31/22 1018  ezetimibe (ZETIA) tablet 10 mg, 10 mg, Oral, QHS, Mansy, Jan A, MD, 10 mg at 12/30/22 2218   ferrous sulfate tablet 325 mg, 325 mg, Oral, Daily, Mansy, Jan A, MD, 325 mg at 12/31/22 1018   FLUoxetine (PROZAC) capsule 20 mg, 20 mg, Oral, BID, Mansy, Jan A, MD, 20 mg at 12/31/22 1018   fluticasone (FLONASE) 50 MCG/ACT nasal spray 2 spray, 2 spray, Each Nare, Daily, Mansy, Jan A, MD, 2 spray at 12/30/22 1610   guaiFENesin (MUCINEX) 12 hr tablet 1,200 mg, 1,200 mg, Oral, BID PRN, Mansy, Jan A, MD   levothyroxine (SYNTHROID) tablet 125 mcg, 125 mcg, Oral, Q0600, Mansy, Jan A, MD, 125 mcg at 12/31/22 0600   lidocaine (LIDODERM) 5 % 1 patch, 1 patch, Transdermal, Q24H, Mansy, Jan A, MD, 1 patch at 12/31/22 1019   loratadine (CLARITIN) tablet 10 mg, 10 mg, Oral, Daily PRN, Mansy, Jan A, MD   LORazepam (ATIVAN) tablet 0.5 mg, 0.5 mg, Oral, Q4H PRN, Mansy, Jan A, MD, 0.5 mg at 12/30/22 2215   magnesium hydroxide (MILK OF MAGNESIA) suspension 30 mL, 30 mL, Oral, Daily PRN, Mansy, Jan A, MD   morphine (PF) 2 MG/ML injection 2 mg, 2 mg, Intravenous, Q4H PRN, Mansy, Jan A, MD   nitroGLYCERIN (NITROSTAT) SL tablet 0.4 mg, 0.4 mg, Sublingual, Q5 min PRN, Mansy, Jan A, MD   ondansetron (ZOFRAN) tablet 4 mg, 4 mg, Oral, Q6H PRN **OR** ondansetron (ZOFRAN) injection 4 mg, 4 mg, Intravenous, Q6H PRN, Mansy, Jan A, MD   pantoprazole (PROTONIX) EC tablet 40 mg, 40 mg, Oral, Daily, Mansy, Jan A, MD, 40 mg at 12/31/22 1018   potassium chloride (KLOR-CON M) CR tablet 10 mEq, 10 mEq, Oral, Daily, Mansy, Jan A, MD, 10 mEq at 12/31/22 1018   revefenacin (YUPELRI) nebulizer solution 175 mcg, 175 mcg, Nebulization, Daily, Mansy, Jan A, MD, 175 mcg at 12/31/22 0759   rosuvastatin (CRESTOR) tablet 10 mg, 10 mg, Oral, QODAY, Mansy, Jan A,  MD, 10 mg at 12/31/22 1018   sodium chloride flush (NS) 0.9 % injection 10 mL, 10 mL, Intravenous, Q12H, Mumma, Shannon, MD, 10 mL at 12/31/22 1019   torsemide (DEMADEX) tablet 20 mg, 20 mg, Oral, Daily PRN, Otelia Sergeant, RPH   traZODone (DESYREL) tablet 25 mg, 25 mg, Oral, QHS PRN, Mansy, Jan A, MD, 25 mg at 12/24/22 2106   vancomycin (VANCOCIN) capsule 125 mg, 125 mg, Oral, QID, Lurene Shadow, MD, 125 mg at 12/31/22 1017    ALLERGIES   Antihistamines, diphenhydramine-type; Incruse ellipta [umeclidinium bromide]; Clarithromycin; Codeine; Diphenhydramine; Pregabalin; Sulfonamide derivatives; Tiotropium bromide monohydrate; Umeclidinium; and Vraylar [cariprazine]     REVIEW OF SYSTEMS    Review of Systems:  Gen:  Denies  fever, sweats, chills weigh loss  HEENT: Denies blurred vision, double vision, ear pain, eye pain, hearing loss, nose bleeds, sore throat Cardiac:  No dizziness, chest pain or heaviness, chest tightness,edema Resp:   reports dyspnea chronically  Gi: Denies swallowing difficulty, stomach pain, nausea or vomiting, diarrhea, constipation, bowel incontinence Gu:  Denies bladder incontinence, burning urine Ext:   Denies Joint pain, stiffness or swelling Skin: Denies  skin rash, easy bruising or bleeding or hives Endoc:  Denies polyuria, polydipsia , polyphagia or weight change Psych:   Denies depression, insomnia or hallucinations   Other:  All other systems negative   VS: BP 114/76 (BP Location: Right Arm)   Pulse (!) 58   Temp 97.6 F (36.4 C)   Resp 15  Ht 5\' 5"  (1.651 m)   Wt 99.3 kg   LMP  (LMP Unknown)   SpO2 97%   BMI 36.44 kg/m      PHYSICAL EXAM    GENERAL:NAD, no fevers, chills, no weakness no fatigue HEAD: Normocephalic, atraumatic.  EYES: Pupils equal, round, reactive to light. Extraocular muscles intact. No scleral icterus.  MOUTH: Moist mucosal membrane. Dentition intact. No abscess noted.  EAR, NOSE, THROAT: Clear without exudates.  No external lesions.  NECK: Supple. No thyromegaly. No nodules. No JVD.  PULMONARY: decreased breath sounds with mild rhonchi worse at bases bilaterally.  CARDIOVASCULAR: S1 and S2. Regular rate and rhythm. No murmurs, rubs, or gallops. No edema. Pedal pulses 2+ bilaterally.  GASTROINTESTINAL: Soft, nontender, nondistended. No masses. Positive bowel sounds. No hepatosplenomegaly.  MUSCULOSKELETAL: No swelling, clubbing, or edema. Range of motion full in all extremities.  NEUROLOGIC: Cranial nerves II through XII are intact. No gross focal neurological deficits. Sensation intact. Reflexes intact.  SKIN: No ulceration, lesions, rashes, or cyanosis. Skin warm and dry. Turgor intact.  PSYCHIATRIC: Mood, affect within normal limits. The patient is awake, alert and oriented x 3. Insight, judgment intact.       IMAGING   @IMAGES @   ASSESSMENT/PLAN     Advanced copd with chronic hypoxemia and hypercarbia  -Agree with APAP and will obtain appropriate mask    Acute UTI   As per [facility] , currently on abx      Thank you for allowing me to participate in the care of this patient.   Patient/Family are satisfied with care plan and all questions have been answered.    Provider disclosure: Patient with at least one acute or chronic illness or injury that poses a threat to life or bodily function and is being managed actively during this encounter.  All of the below services have been performed independently by signing provider:  review of prior documentation from internal and or external health records.  Review of previous and current lab results.  Interview and comprehensive assessment during patient visit today. Review of current and previous chest radiographs/CT scans. Discussion of management and test interpretation with health care team and patient/family.   This document was prepared using Dragon voice recognition software and may include unintentional dictation errors.     Vida Rigger, M.D.  Division of Pulmonary & Critical Care Medicine

## 2022-12-31 NOTE — Progress Notes (Signed)
Patient is very non-compliant with home unit. Placed patient on unit and only after 5-10 minutes, patient ripped unit off, placed patient back on nasal cannula

## 2022-12-31 NOTE — Plan of Care (Signed)

## 2022-12-31 NOTE — [place] FL2 (Signed)
[place] MEDICAID FL2 LEVEL OF CARE FORM     IDENTIFICATION  Patient Name: Alexandria [name] Birthdate: May 02, 1935 Sex: female Admission Date (Current Location): 12/29/2022  [facility] and [place] Number:  Producer, television/film/video and Address:  [facility], [address], [place], [place] [address]      Provider Number: [id number]  Attending Physician Name and Address:  Lurene Shadow, MD  Relative Name and Phone Number:  [name] daughter [phone]    Current Level of Care: Hospital Recommended Level of Care: Assisted Living Facility (with PT and OT) Prior Approval Number:    Date Approved/Denied:   PASRR Number: [id number]  Discharge Plan: Other (Comment) (ALF with PT and OT)    Current Diagnoses: Patient Active Problem List   Diagnosis Date Noted   C. difficile colitis 12/25/2022   Acute diverticulitis 12/17/2022   Acute lower UTI 12/19/2022   Closed compression fracture of body of L1 vertebra (HCC) 12/10/2022   Coronary artery disease 12/24/2022   GERD without esophagitis 12/25/2022   Dyslipidemia 12/30/2022   Anxiety and depression 01/06/2023   Back pain 12/22/2022   Renal lesion 12/22/2022   Acute respiratory disease due to COVID-19 virus 11/29/2022   Acute on chronic respiratory failure with hypercapnia (HCC) 11/28/2022   COPD (chronic obstructive pulmonary disease) (HCC) 11/28/2022   Depression with anxiety 11/28/2022   Obesity (BMI 30-39.9) 11/28/2022   Hypernatremia 11/23/2022   Confusion 11/21/2022   Constipation 11/21/2022   Symptomatic anemia 11/17/2022   Acute on chronic diastolic CHF (congestive heart failure) (HCC) 11/17/2022   COPD with acute exacerbation (HCC) 11/17/2022   Goals of care, counseling/discussion 10/05/2022   Stage 3b chronic kidney disease (HCC) 10/05/2022   Neurogenic orthostatic hypotension (HCC) 07/13/2022   Non-pressure chronic ulcer of other part of right lower leg with other specified severity (HCC)  07/13/2022   Atherosclerotic heart disease of native coronary artery with other forms of angina pectoris (HCC) 07/13/2022   Chronic diastolic (congestive) heart failure (HCC) 07/07/2022   Paroxysmal atrial fibrillation (HCC) 07/02/2022   Acute on chronic respiratory failure with hypoxia and hypercapnia (HCC) 06/29/2022   Acute encephalopathy 06/29/2022   Aortic stenosis, mild 03/30/2022   PVC's (premature ventricular contractions) 02/23/2022   Skin lesion 08/12/2021   Leg swelling 06/08/2021   Grief 05/27/2021   H/O laceration of skin 05/27/2021   PND (post-nasal drip) 04/27/2021   Wheezing 04/27/2021   Acute cough 04/27/2021   Physical deconditioning 01/30/2020   Healthcare maintenance 01/30/2020   PAD (peripheral artery disease) (HCC) 01/27/2020   Sore throat 11/21/2019   GERD (gastroesophageal reflux disease) 11/21/2019   CAD (coronary artery disease) 07/24/2019   Malignant neoplasm of right upper lobe of lung (HCC) 07/24/2019   Vitamin D deficiency 06/29/2018   Hyperkalemia 06/28/2018   Dysuria 01/01/2018   Abnormal findings on diagnostic imaging of lung 12/18/2017   Rhinitis 12/18/2017   Knee laceration, right, subsequent encounter 10/06/2016   Knee pain, acute 10/06/2016   Chronic pain of left knee 09/15/2016   Right foot pain 09/15/2016   Chronic respiratory failure with hypoxia (HCC) 07/14/2016   Diverticulosis of colon without hemorrhage    Hemorrhoids    Hiatal hernia    Type 2 diabetes mellitus with diabetic neuropathy, without long-term current use of insulin (HCC) 04/04/2015   Left temporal headache 03/14/2015   Vertigo 03/14/2015   Chest tightness 12/04/2014   Cystitis 11/03/2014   Acute on chronic heart failure with preserved ejection fraction (HFpEF) (HCC)  Iron deficiency anemia    OSA on CPAP    Obesity hypoventilation syndrome (HCC)    Chronic diastolic CHF (congestive heart failure) (HCC)    UTI (urinary tract infection)    Acute on chronic blood  loss anemia    Medicare annual wellness visit, subsequent 06/14/2014   Advance care planning 06/14/2014   RLQ abdominal pain 05/10/2014   COPD exacerbation (HCC) 04/28/2014   Lower extremity edema 09/15/2012   DOE (dyspnea on exertion) 08/24/2012   Shoulder pain 12/17/2011   Abdominal hernia 12/17/2011   Anxiety 09/23/2011   Osteopenia 07/25/2011   Obstructive sleep apnea 02/09/2010   Murmur 02/04/2010   CHEST PAIN 01/13/2010   PALPITATIONS 08/29/2008   ESOPHAGEAL STENOSIS 11/22/2007   DYSPHAGIA 11/08/2007   ESOPHAGITIS 10/11/2007   Edema 07/03/2007   COPD with chronic bronchitis and emphysema (HCC) 05/23/2007   ALLERGY 07/25/2006   NUMBNESS 10/06/2001   Hyperlipemia 10/04/2000   Depression, recurrent (HCC) 03/08/1997   Essential hypertension 03/08/1992   Obesity, Class III, BMI 40-49.9 (morbid obesity) (HCC) 03/08/1989   ROTATOR CUFF SYNDROME, RIGHT 03/08/1982   Hypothyroidism 03/08/1968    Orientation RESPIRATION BLADDER Height & Weight     Self, Time, Situation, Place  O2, Other (Comment) (Nasal Cannula 2 L. CPAP QHS.) Continent Weight: 219 lb (99.3 kg) Height:  5\' 5"  (165.1 cm)  BEHAVIORAL SYMPTOMS/MOOD NEUROLOGICAL BOWEL NUTRITION STATUS   (None)   Continent Diet (Heart healthy/carb modified)  AMBULATORY STATUS COMMUNICATION OF NEEDS Skin   Limited Assist Verbally Bruising, Other (Comment) (Blister, erythema/redness.)                       Personal Care Assistance Level of Assistance  Bathing, Feeding, Dressing Bathing Assistance: Limited assistance Feeding assistance: Limited assistance Dressing Assistance: Maximum assistance     Functional Limitations Info  Sight, Hearing, Speech Sight Info: Adequate Hearing Info: Adequate Speech Info: Adequate    SPECIAL CARE FACTORS FREQUENCY  PT (By licensed PT), OT (By licensed OT)     PT Frequency: 3 x week OT Frequency: 3 x week            Contractures Contractures Info: Not present    Additional  Factors Info  Code Status, Allergies Code Status Info: Full code Allergies Info: Antihistamines, Diphenhydramine-type, Incruse Ellipta (Umeclidinium Bromide), Clarithromycin, Codeine, Diphenhydramine, Pregabalin, Sulfonamide Derivatives, Tiotropium Bromide Monohydrate, Umeclidinium, Vraylar (Cariprazine)           Current Medications (12/31/2022):  This is the current hospital active medication list Current Facility-Administered Medications  Medication Dose Route Frequency Provider Last Rate Last Admin   acetaminophen (TYLENOL) tablet 650 mg  650 mg Oral TID Enedina Finner, MD   650 mg at 12/31/22 1018   albuterol (PROVENTIL) (2.5 MG/3ML) 0.083% nebulizer solution 3 mL  3 mL Inhalation Q6H PRN Mansy, Jan A, MD   3 mL at 12/28/22 1305   amiodarone (PACERONE) tablet 200 mg  200 mg Oral Daily Mansy, Jan A, MD   200 mg at 12/31/22 1018   arformoterol (BROVANA) nebulizer solution 15 mcg  15 mcg Nebulization QHS Mansy, Jan A, MD   15 mcg at 12/30/22 2142   budesonide (PULMICORT) nebulizer solution 0.5 mg  0.5 mg Nebulization BID Mansy, Jan A, MD   0.5 mg at 12/31/22 0759   busPIRone (BUSPAR) tablet 15 mg  15 mg Oral TID Mansy, Jan A, MD   15 mg at 12/31/22 1018   cholecalciferol (VITAMIN D3) 25 MCG (1000 UNIT) tablet  2,000 Units  2,000 Units Oral Daily Mansy, Jan A, MD   2,000 Units at 12/31/22 1018   cyanocobalamin (VITAMIN B12) tablet 2,000 mcg  2,000 mcg Oral Daily Mansy, Jan A, MD   2,000 mcg at 12/31/22 1018   ezetimibe (ZETIA) tablet 10 mg  10 mg Oral QHS Mansy, Jan A, MD   10 mg at 12/30/22 2218   ferrous sulfate tablet 325 mg  325 mg Oral Daily Mansy, Jan A, MD   325 mg at 12/31/22 1018   FLUoxetine (PROZAC) capsule 20 mg  20 mg Oral BID Mansy, Jan A, MD   20 mg at 12/31/22 1018   fluticasone (FLONASE) 50 MCG/ACT nasal spray 2 spray  2 spray Each Nare Daily Mansy, Jan A, MD   2 spray at 12/30/22 0925   guaiFENesin (MUCINEX) 12 hr tablet 1,200 mg  1,200 mg Oral BID PRN Mansy, Jan A, MD        levothyroxine (SYNTHROID) tablet 125 mcg  125 mcg Oral Q0600 Mansy, Jan A, MD   125 mcg at 12/31/22 0600   lidocaine (LIDODERM) 5 % 1 patch  1 patch Transdermal Q24H Mansy, Jan A, MD   1 patch at 12/31/22 1019   loratadine (CLARITIN) tablet 10 mg  10 mg Oral Daily PRN Mansy, Jan A, MD       LORazepam (ATIVAN) tablet 0.5 mg  0.5 mg Oral Q4H PRN Mansy, Jan A, MD   0.5 mg at 12/30/22 2215   magnesium hydroxide (MILK OF MAGNESIA) suspension 30 mL  30 mL Oral Daily PRN Mansy, Jan A, MD       morphine (PF) 2 MG/ML injection 2 mg  2 mg Intravenous Q4H PRN Mansy, Jan A, MD       nitroGLYCERIN (NITROSTAT) SL tablet 0.4 mg  0.4 mg Sublingual Q5 min PRN Mansy, Jan A, MD       ondansetron [facility]) tablet 4 mg  4 mg Oral Q6H PRN Mansy, Jan A, MD       Or   ondansetron [facility]) injection 4 mg  4 mg Intravenous Q6H PRN Mansy, Jan A, MD       pantoprazole (PROTONIX) EC tablet 40 mg  40 mg Oral Daily Mansy, Jan A, MD   40 mg at 12/31/22 1018   potassium chloride (KLOR-CON M) CR tablet 10 mEq  10 mEq Oral Daily Mansy, Jan A, MD   10 mEq at 12/31/22 1018   revefenacin (YUPELRI) nebulizer solution 175 mcg  175 mcg Nebulization Daily Mansy, Jan A, MD   175 mcg at 12/31/22 0759   rosuvastatin (CRESTOR) tablet 10 mg  10 mg Oral QODAY Mansy, Jan A, MD   10 mg at 12/31/22 1018   sodium chloride flush (NS) 0.9 % injection 10 mL  10 mL Intravenous Q12H Mumma, Carollee Herter, MD   10 mL at 12/31/22 1019   torsemide (DEMADEX) tablet 20 mg  20 mg Oral Daily PRN Otelia Sergeant, RPH       traZODone (DESYREL) tablet 25 mg  25 mg Oral QHS PRN Mansy, Jan A, MD   25 mg at 12/24/22 2106   vancomycin (VANCOCIN) capsule 125 mg  125 mg Oral QID Lurene Shadow, MD   125 mg at 12/31/22 1422     Discharge Medications: Please see discharge summary for a list of discharge medications.  Relevant Imaging Results:  Relevant Lab Results:   Additional Information SS#: [id number]  Margarito Liner, LCSW

## 2022-12-31 NOTE — Progress Notes (Signed)
Physical Therapy Treatment Patient Details Name: Alexandria [name] MRN: [id number] DOB: 07-06-1935 Today's Date: 12/31/2022   History of Present Illness Alexandria [name] is a 87 y.o. female with medical history significant for anxiety, aortic stenosis, COPD, CAD, Type II DM, AFIB, GERD, HTN, dyslipidemia, hypothyroidism, OSA, CKD stage IIIb, peripheral neuropathy, obesity, who presented to the hospital with abdominal pain.  Positive for C. Diff, compression fx of L1 vertebra (TLSO for comfort) and CHF.    PT Comments  Pt is received in bed, with encouragement she is agreeable to PT session. Pt performs bed mobility modA; unable to assess further mobility during today's session due to Pt's refusal of OOB reporting "I don't want to get up right now". Pt performs bed exercises to promote BLE strength and upkeep mobility. Pt requires approx 30-60 sec rest breaks between sets due to increase WOB although SpO2% maintained above 95% on 2L [place]. Encouraged Pt to change position and sit EOB but Pt continued to refuse stating she will get up to use restroom soon. Overall, Pt demonstrates slight progress towards PT goals and would benefit from skilled PT to address above deficits and promote optimal return to PLOF.   If plan is discharge home, recommend the following: A little help with walking and/or transfers;A little help with bathing/dressing/bathroom;Assistance with cooking/housework;Help with stairs or ramp for entrance;Assist for transportation   Can travel by private vehicle     Yes  Equipment Recommendations  Other (comment) (TBD at next facility)    Recommendations for Other Services       Precautions / Restrictions Precautions Precautions: Fall Precaution Comments: watch O2 Restrictions Weight Bearing Restrictions: No     Mobility  Bed Mobility Overal bed mobility: Needs Assistance Bed Mobility: Rolling Rolling: Mod assist         General bed mobility comments: Pt able to roll R/L  modA for repositioning    Transfers                   General transfer comment: unable to assess due to Pt refusing OOB mobility at this time    Ambulation/Gait               General Gait Details: unable to assess due to Pt refusing OOB mobility at this time   Stairs             Wheelchair Mobility     Tilt Bed    Modified Rankin (Stroke Patients Only)       Balance       Sitting balance - Comments: unable to formally assess seated balance at this time due to Pt refusing sitting EOB       Standing balance comment: unable to formally assess seated balance at this time due to Pt refusing OOB mobility                            Cognition Arousal: Alert Behavior During Therapy: WFL for tasks assessed/performed Overall Cognitive Status: Within Functional Limits for tasks assessed                                 General Comments: Pleasant and with encouragement cooperative with PT session        Exercises Other Exercises Other Exercises: x10ea SAQ, x10ea SLR, x10ea supine marches    General Comments General comments (skin integrity, edema, etc.): SpO2%  95%+ on 2L [place]      Pertinent Vitals/Pain Pain Assessment Pain Assessment: No/denies pain    Home Living                          Prior Function            PT Goals (current goals can now be found in the care plan section) Acute Rehab PT Goals Patient Stated Goal: go to rehab then home PT Goal Formulation: With patient Time For Goal Achievement: 01/10/23 Potential to Achieve Goals: Fair Progress towards PT goals: Progressing toward goals    Frequency    Min 1X/week      PT Plan      Co-evaluation              AM-PAC PT "6 Clicks" Mobility   Outcome Measure  Help needed turning from your back to your side while in a flat bed without using bedrails?: A Little Help needed moving from lying on your back to sitting on the side of a  flat bed without using bedrails?: A Little Help needed moving to and from a bed to a chair (including a wheelchair)?: A Little Help needed standing up from a chair using your arms (e.g., wheelchair or bedside chair)?: A Little Help needed to walk in hospital room?: A Little Help needed climbing 3-5 steps with a railing? : A Lot 6 Click Score: 17    End of Session Equipment Utilized During Treatment: Oxygen Activity Tolerance: Patient limited by fatigue Patient left: in bed;with call bell/phone within reach;with bed alarm set Nurse Communication: Mobility status PT Visit Diagnosis: Unsteadiness on feet (R26.81);Muscle weakness (generalized) (M62.81)     Time: [id number] PT Time Calculation (min) (ACUTE ONLY): 14 min  Charges:                            Elmon Else, SPT    Japhet Morgenthaler 12/31/2022, 2:52 PM

## 2022-12-31 NOTE — Progress Notes (Signed)
PT refused wearing CPAP. Reiterate reasons for this treatment. Pt continued to refuse. Notified attend DM and Pulmonologist.

## 2022-12-31 NOTE — Plan of Care (Signed)
  Problem: Education: Goal: Knowledge of General Education information will improve Description: Including pain rating scale, medication(s)/side effects and non-pharmacologic comfort measures Outcome: Progressing   Problem: Coping: Goal: Level of anxiety will decrease Outcome: Progressing   

## 2022-12-31 NOTE — Progress Notes (Signed)
Family friend provided a pillow nasal for CPAP from home. Applied CPAP on. Pt tolerated well.

## 2022-12-31 NOTE — Progress Notes (Addendum)
Progress Note    Alexandria [name]  AOZ:[id number] DOB: 1936/01/20  DOA: 12/10/2022 PCP: Joaquim Nam, MD      Brief Narrative:    Medical records reviewed and are as summarized below:  Alexandria [name] is a 87 y.o. female with medical history significant for anxiety, aortic stenosis, COPD, coronary artery disease, type 2 diabetes mellitus, atrial fibrillation, GERD, hypertension, dyslipidemia, hypothyroidism, OSA, CKD stage IIIb, peripheral neuropathy, obesity, who presented to the hospital with abdominal pain.  She said abdominal pain has been going on intermittently for about 3 to 4 weeks.  She has also been having low back pain for about a couple of weeks that has slowly worsened.  There was also report of diarrhea.  Patient however said stools were not watery but reportedly she had bloodstained stool about 3 days prior to admission.  Stools were mostly greenish in color.  There was concern of C. difficile infection and apparently tested positive so she was started on oral vancomycin.       Assessment/Plan:   Principal Problem:   Acute diverticulitis Active Problems:   Acute lower UTI   Closed compression fracture of body of L1 vertebra (HCC)   Hypothyroidism   Paroxysmal atrial fibrillation (HCC)   Chronic diastolic CHF (congestive heart failure) (HCC)   Coronary artery disease   GERD without esophagitis   Dyslipidemia   Anxiety and depression   C. difficile colitis    Body mass index is 36.44 kg/m.  (Obesity)    Acute diverticulitis: Completed antibiotics on 12/29/2022 (4 days of IV ceftriaxone and Flagyl followed by 3 days of Augmentin). Analgesics as needed for pain. Gastric fold thickening on CT,?  Gastritis: Continue Protonix.   Diarrhea: She still has some diarrhea.  Encouraged adequate oral intake.   C. difficile infection: Patient had a positive C. difficile toxin test on 12/22/2022.  Copy of report was faxed to the unit. Stool for occult blood,  lactoferrin and C.diff molecular (toxin B) were positive.  Reportedly, patient was started on oral vancomycin prior to coming to the hospital.  Continue oral vancomycin through 01/03/2023   Cough: Chest x-ray ordered.  Report is pending   Acute UTI ruled out: Urine culture showed multiple species.   L1 compression fracture: Analgesics as needed for pain.  Patient declined LTSO brace.  She has been evaluated by neurosurgeon and no surgical intervention required.   Continue PT and OT while inpatient   Chronic diastolic CHF: Compensated.  Torsemide on hold because of soft blood pressure and diarrhea.  Apparently blood pressure runs on the low side.      Advance COPD, chronic hypoxemic and hypercapnic respiratory failure, obstructive sleep apnea: Patient has been refusing to use CPAP at night because mask is uncomfortable.  Daughter requested evaluation by pulmonologist.  Consulted Dr. Karna Christmas, per daughter's request. ABG done on 12/31/2022 showed pH 7.34, pCO2 61, pO2 94, bicarb 32.7, O2 sat 99.1 on 2 L/min oxygen   Other comorbidities include paroxysmal atrial fibrillation (not on long-term anticoagulation because of concern for high bleeding risk), type II DM, CAD, GERD, hypothyroidism, anxiety, depression, dyslipidemia   Plan discussed with [name], daughter, over the phone  Diet Order             Diet heart healthy/carb modified Room service appropriate? Yes; Fluid consistency: Thin  Diet effective now  Consultants: Neurosurgeon Pulmonologist  Procedures: None    Medications:    acetaminophen  650 mg Oral TID   amiodarone  200 mg Oral Daily   arformoterol  15 mcg Nebulization QHS   budesonide  0.5 mg Nebulization BID   busPIRone  15 mg Oral TID   cholecalciferol  2,000 Units Oral Daily   cyanocobalamin  2,000 mcg Oral Daily   ezetimibe  10 mg Oral QHS   ferrous sulfate  325 mg Oral Daily   FLUoxetine  20 mg Oral BID    fluticasone  2 spray Each Nare Daily   levothyroxine  125 mcg Oral Q0600   lidocaine  1 patch Transdermal Q24H   pantoprazole  40 mg Oral Daily   potassium chloride  10 mEq Oral Daily   revefenacin  175 mcg Nebulization Daily   rosuvastatin  10 mg Oral QODAY   sodium chloride flush  10 mL Intravenous Q12H   vancomycin  125 mg Oral QID   Continuous Infusions:     Anti-infectives (From admission, onward)    Start     Dose/Rate Route Frequency Ordered Stop   12/27/22 1000  amoxicillin-clavulanate (AUGMENTIN) 875-125 MG per tablet 1 tablet        1 tablet Oral Every 12 hours 12/26/22 0832 12/29/22 2132   12/25/22 1100  vancomycin (VANCOCIN) capsule 125 mg        125 mg Oral 4 times daily 12/25/22 1005 01/04/23 0959   Dec 27, 2022 2115  cefTRIAXone (ROCEPHIN) 2 g in sodium chloride 0.9 % 100 mL IVPB        2 g 200 mL/hr over 30 Minutes Intravenous Every 24 hours 12-27-22 2106 12/26/22 2108   2022-12-27 2115  metroNIDAZOLE (FLAGYL) IVPB 500 mg        500 mg 100 mL/hr over 60 Minutes Intravenous Every 8 hours 2022-12-27 2106 12/26/22 2232   Dec 27, 2022 2045  cefTRIAXone (ROCEPHIN) 2 g in sodium chloride 0.9 % 100 mL IVPB  Status:  Discontinued        2 g 200 mL/hr over 30 Minutes Intravenous  Once 12-27-22 2044 27-Dec-2022 2149   2022/12/27 2045  metroNIDAZOLE (FLAGYL) IVPB 500 mg  Status:  Discontinued        500 mg 100 mL/hr over 60 Minutes Intravenous  Once 2022-12-27 2044 12-27-22 2149              Family Communication/Anticipated D/C date and plan/Code Status   DVT prophylaxis:      Code Status: Full Code  Family Communication: Plan discussed with [name], daughter, over the phone Disposition Plan: Plan to discharge to ALF  Status is: Inpatient Remains inpatient appropriate because: Awaiting placement to ALF        Subjective:   Interval events noted.  She said she is trying to stay awake and eat something.  No bowel movement today but she had 4 watery stools yesterday.   She was able to call her daughter on her cell phone during this encounter.  Daughter reports that patient has been coughing since yesterday.  She also said she has noticed that patient's speech is a little slurred and that patient seems a little confused.  She requested evaluation by Dr. Karna Christmas, pulmonologist, because she is concerned that patient's CO2 level is going up because she's not using the CPAP at night.  She wants pulmonologist to prescribe a different mask.  Objective:    Vitals:   12/30/22 2142 12/31/22 0002 12/31/22 0857 12/31/22 1310  BP:  Marland Kitchen)  113/42 114/76   Pulse:  (!) 59 (!) 58   Resp:  19 15 16   Temp:  98 F (36.7 C) 97.6 F (36.4 C)   TempSrc:      SpO2: 94% 94% 97%   Weight:      Height:       No data found.  No intake or output data in the 24 hours ending 12/31/22 1521  Filed Weights   12/19/2022 1431  Weight: 99.3 kg    Exam:  GEN: NAD SKIN: Warm and dry EYES: No pallor or icterus ENT: MMM CV: RRR PULM: CTA B ABD: soft, obese, mild mid abdominal tenderness without rebound tenderness or guarding, +BS CNS: AAO x 3, non focal EXT: Mild bilateral leg edema.  Erythematous changes of bilateral legs.  No tenderness       Data Reviewed:   I have personally reviewed following labs and imaging studies:  Labs: Labs show the following:   Basic Metabolic Panel: Recent Labs  Lab 12/27/22 0528  NA 142  K 3.9  CL 105  CO2 29  GLUCOSE 104*  BUN 16  CREATININE 1.12*  CALCIUM 8.6*  MG 2.2  PHOS 2.9   GFR Estimated Creatinine Clearance: 42.8 mL/min (A) (by C-G formula based on SCr of 1.12 mg/dL (H)). Liver Function Tests: No results for input(s): "AST", "ALT", "ALKPHOS", "BILITOT", "PROT", "ALBUMIN" in the last 168 hours.  No results for input(s): "LIPASE", "AMYLASE" in the last 168 hours.  No results for input(s): "AMMONIA" in the last 168 hours. Coagulation profile No results for input(s): "INR", "PROTIME" in the last 168  hours.  CBC: Recent Labs  Lab 12/27/22 0528 12/30/22 1122  WBC 4.3  --   NEUTROABS 3.3  --   HGB 8.6* 8.3*  HCT 28.7*  --   MCV 105.1*  --   PLT 146*  --    Cardiac Enzymes: No results for input(s): "CKTOTAL", "CKMB", "CKMBINDEX", "TROPONINI" in the last 168 hours. BNP (last 3 results) Recent Labs    04/22/22 1624  PROBNP 69.0   CBG: No results for input(s): "GLUCAP" in the last 168 hours. D-Dimer: No results for input(s): "DDIMER" in the last 72 hours. Hgb A1c: No results for input(s): "HGBA1C" in the last 72 hours. Lipid Profile: No results for input(s): "CHOL", "HDL", "LDLCALC", "TRIG", "CHOLHDL", "LDLDIRECT" in the last 72 hours. Thyroid function studies: No results for input(s): "TSH", "T4TOTAL", "T3FREE", "THYROIDAB" in the last 72 hours.  Invalid input(s): "FREET3" Anemia work up: No results for input(s): "VITAMINB12", "FOLATE", "FERRITIN", "TIBC", "IRON", "RETICCTPCT" in the last 72 hours. Sepsis Labs: Recent Labs  Lab 12/27/22 0528  WBC 4.3    Microbiology Recent Results (from the past 240 hour(s))  Urine Culture (for pregnant, neutropenic or urologic patients or patients with an indwelling urinary catheter)     Status: Abnormal   Collection Time: 12/14/2022  7:48 PM   Specimen: Urine, Clean Catch  Result Value Ref Range Status   Specimen Description   Final    URINE, CLEAN CATCH Performed at [facility], [address], [place], [place] [address]    Special Requests   Final    NONE Performed at [facility], [address] [place], [place] [address]    Culture MULTIPLE SPECIES PRESENT, SUGGEST RECOLLECTION (A)  Final   Report Status 12/25/2022 FINAL  Final  Culture, blood (Routine X 2) w Reflex to ID Panel     Status: None   Collection Time: 12/24/22  2:00 AM   Specimen: BLOOD LEFT HAND  Result Value Ref Range Status   Specimen Description BLOOD LEFT HAND  Final   Special Requests   Final    BOTTLES DRAWN AEROBIC ONLY  Blood Culture results may not be optimal due to an inadequate volume of blood received in culture bottles   Culture   Final    NO GROWTH 5 DAYS Performed at [facility], [address] [place], [place] [address]    Report Status 12/29/2022 FINAL  Final  Culture, blood (Routine X 2) w Reflex to ID Panel     Status: None   Collection Time: 12/24/22  2:00 AM   Specimen: BLOOD RIGHT HAND  Result Value Ref Range Status   Specimen Description BLOOD RIGHT HAND  Final   Special Requests   Final    BOTTLES DRAWN AEROBIC ONLY Blood Culture adequate volume   Culture   Final    NO GROWTH 5 DAYS Performed at [facility], [address], [place], [place] [address]    Report Status 12/29/2022 FINAL  Final    Procedures and diagnostic studies:  No results found.             LOS: 7 days   Eustacio Ellen  [facility]   Pager on [email or web address]. If 7PM-7AM, please contact night-coverage at [email or web address]     12/31/2022, 3:21 PM

## 2023-01-01 DIAGNOSIS — J449 Chronic obstructive pulmonary disease, unspecified: Secondary | ICD-10-CM

## 2023-01-01 DIAGNOSIS — N3000 Acute cystitis without hematuria: Secondary | ICD-10-CM | POA: Diagnosis not present

## 2023-01-01 DIAGNOSIS — K5792 Diverticulitis of intestine, part unspecified, without perforation or abscess without bleeding: Secondary | ICD-10-CM | POA: Diagnosis not present

## 2023-01-01 DIAGNOSIS — Z515 Encounter for palliative care: Secondary | ICD-10-CM | POA: Diagnosis not present

## 2023-01-01 DIAGNOSIS — S32010A Wedge compression fracture of first lumbar vertebra, initial encounter for closed fracture: Secondary | ICD-10-CM | POA: Diagnosis not present

## 2023-01-01 NOTE — Progress Notes (Signed)
  Interdisciplinary Goals of Care Family Meeting   Date carried out: 01/01/2023  Location of the meeting: Bedside  Member's involved: Physician, Bedside Registered Nurse, and Family Member or next of kin, [name] (over the phone) and Sister (at the bedside)  Engineer, agricultural or Environmental health practitioner: Garrison Columbus, daughter  Discussion: We discussed goals of care for Alexandria [name] .  Patient and her family would like to speak with palliative care team.  Orpha Bur, NP, with palliative care team, to speak with patient and her family.  Code status:   Code Status: Full Code   Disposition: Continue current acute care  Time spent for the meeting: 12 minutes    Lurene Shadow, MD  01/01/2023, 2:21 PM

## 2023-01-01 NOTE — Plan of Care (Signed)
  Problem: Education: Goal: Knowledge of General Education information will improve Description: Including pain rating scale, medication(s)/side effects and non-pharmacologic comfort measures Outcome: Progressing   Problem: Nutrition: Goal: Adequate nutrition will be maintained Outcome: Progressing   Problem: Elimination: Goal: Will not experience complications related to bowel motility Outcome: Progressing Goal: Will not experience complications related to urinary retention Outcome: Progressing   Problem: Pain Managment: Goal: General experience of comfort will improve Outcome: Progressing   Problem: Safety: Goal: Ability to remain free from injury will improve Outcome: Progressing   Problem: Skin Integrity: Goal: Risk for impaired skin integrity will decrease Outcome: Progressing   

## 2023-01-01 NOTE — TOC Progression Note (Signed)
Transition of Care [facility]) - Progression Note    Patient Details  Name: Alexandria [name] MRN: [id number] Date of Birth: [birth year]  Transition of Care [facility]) CM/SW Contact  Kemper Durie, RN Phone Number: 01/01/2023, 2:40 PM  Clinical Narrative:     Spoke with daughter [name] extensively regarding placement.  She inquired if [facility] had accepted patient and if there were any beds in [place] for the patient.  Advised that [facility] had declined due to inability to meet patient's needs.  Also advised of process for bed search, notified that there wasn't a search started for [facility] beds.  Made aware that [facility], and [facility] has all accepted, [facility] is considering, and [facility] is pending.  Daughter state she does not want [place] facilities if it can't be [employer].  Advised to provide preference for [facility] and surrounding facilities, state she would only like to submit search for 4-5 star ratings on [email or web address]  Made daughter aware of 8 facilities within a 25 mile radius of [place] with the requested star ratings ([facility], [facility], [facility], [facility], [facility], [facility], [facility], and [facility]), offered to have search started.  She asks if bed is available at [facility], again notified that each facility would have to assess patient's case for bed availability and ability to meet needs.  Offered again to extend search, she declined.  State MD has brought up the topic of palliative care.  She would like to have a discussion with palliative care team before making a decision.   Expected Discharge Plan: Skilled Nursing Facility Barriers to Discharge: SNF Pending bed offer  Expected Discharge Plan and Services   Discharge Planning Services: CM Consult   Living arrangements for the past 2 months: Assisted Living Facility                   DME Agency: NA       HH Arranged: NA           Social Determinants of Health  (SDOH) Interventions SDOH Screenings   Food Insecurity: No Food Insecurity (12/24/2022)  Housing: Low Risk  (12/24/2022)  Transportation Needs: No Transportation Needs (12/24/2022)  Utilities: Not At Risk (12/24/2022)  Alcohol Screen: Low Risk  (10/21/2022)  Depression (PHQ2-9): Low Risk  (10/21/2022)  Recent Concern: Depression (PHQ2-9) - Medium Risk (08/03/2022)  Financial Resource Strain: Low Risk  (10/21/2022)  Physical Activity: Insufficiently Active (10/21/2022)  Social Connections: Socially Isolated (10/21/2022)  Stress: No Stress Concern Present (10/21/2022)  Tobacco Use: Medium Risk (12/30/2022)  Health Literacy: Adequate Health Literacy (10/21/2022)    Readmission Risk Interventions    12/28/2022    3:01 PM 11/18/2022    4:47 PM  Readmission Risk Prevention Plan  Transportation Screening Complete Complete  Medication Review (RN Care Manager) Complete Complete  PCP or Specialist appointment within 3-5 days of discharge Complete   HRI or Home Care Consult Complete Complete  SW Recovery Care/Counseling Consult Complete Complete  Palliative Care Screening Not Applicable Not Applicable  Skilled Nursing Facility Complete Complete

## 2023-01-01 NOTE — Progress Notes (Signed)
PULMONOLOGY         Date: 01/01/2023,   MRN# [id number] Alexandria [name] [birth year]     AdmissionWeight: 99.3 kg                 CurrentWeight: 99.3 kg  Referring provider: Dr Denton Lank   CHIEF COMPLAINT:   Acute on chronic hypoxemic respiratory failure   HISTORY OF PRESENT ILLNESS    Patient went to pulmonologist and went to have APAP mask changed but was unable to get correct interface.  She is not able to tolerate current mask interface.  Alexandria [name] her daughter shares that CO2 levels have been increased and causes her to have encephalopathy.  She also notes claustrophobia with full face mask.   01/01/23- Patient examined at baseline.  She was sitting up today but still with confusion and episodes of encephalopathy.  Patient does not wear cpap mask as instructed and this is partially due to encephalopathy but also due to comorbid advanced age status. We had a conference together with her POA daugther Alexandria [name] and in presence of attending physician Dr Myriam Forehand.  We agreed that we do not wish to force patient to do things and do not recommend aggressive therapy due to advanced age comorbid status. Patient and family agree to goals of care discussion with Palliative care consultant.  We will not escalate therapy at this time.   PAST MEDICAL HISTORY   Past Medical History:  Diagnosis Date   Allergy, unspecified not elsewhere classified    Anxiety    Aortic stenosis, mild 03/30/2022   a. TTE 03/29/2022: EF 55-60, no RWMA, GR 1 DD, normal RVSF, mild aortic stenosis (mean 10, V-max 212 cm/s, DI 0.70)   Cataract    Chronic diastolic congestive heart failure (HCC)    a. 06/2022 Echo: EF 60-65%, no rwma, nl RV fxn, mild MR, mild AS.   COPD (chronic obstructive pulmonary disease) (HCC) 03/08/1998   PFTs 12/12/2002 FEV 1 1.42 (64%) ratio 58 with no better after B2 and DLCO75%; PFTs 11/20/09 FEV1 1.50 (73%) ratio 50 no better after B2 with DLCO 62%; Hfa 50% 11/20/2009 >75%, 01/13/10 p coaching    Coronary artery calcification seen on CT scan    a. 2012 Neg stress test.   Depression 12/07/1998   Diabetes mellitus type II 02/05/2006   Dr. Elvera Lennox with endo   Disorders of bursae and tendons in shoulder region, unspecified    Rotator cuff syndrome, right   E. coli bacteremia    Esophagitis    GERD (gastroesophageal reflux disease)    History of UTI    Hyperlipidemia 10/04/2000   Hypertension 03/08/1992   Hypothyroidism 03/08/1968   Iron deficiency anemia    Lung nodule    radiation starts 10-08-2019   Malignant neoplasm of right upper lobe of lung (HCC) 07/24/2019   Obesity    NOS   OSA (obstructive sleep apnea)    PSG 01/27/10 AHI 13, pt does not know CPAP settings   PAF (paroxysmal atrial fibrillation) (HCC)    a. 06/2022 in setting of UTI/encephalopathy-->converted on amio; b. CHA2DS2VASc = 6-->No OAC 2/2 chronic anemia/fall risk; c. 06/2022 Zio: Predominantly sinus bradycardia at 54 (3-158)'s.  1 brief run of SVT.  2.2% PVC burden.   Peripheral neuropathy    Likely due to DM per Dr. Sandria Manly   Renal lesion 12/22/2022   Ventral hernia      SURGICAL HISTORY   Past Surgical History:  Procedure Laterality Date   ABD  U/S  03/19/1999   Nml x2 foci in liver   ADENOSINE MYOVIEW  06/02/2007   Nml   CARDIOLITE PERSANTINE  08/24/2000   Nml   CAROTID U/S  08/24/2000   1-39% ICA stenosis   CAROTID U/S  06/02/2007   No apprec change    CARPAL TUNNEL RELEASE  12/1997   Right   CESAREAN SECTION     x2 Breech/ repeat   CHOLECYSTECTOMY  1997   COLONOSCOPY WITH PROPOFOL N/A 02/12/2016   Procedure: COLONOSCOPY WITH PROPOFOL;  Surgeon: Rachael Fee, MD;  Location: [facility];  Service: Endoscopy;  Laterality: N/A;   CT ABD W & PELVIS WO/W CM     Abd hemangiomas of liver, 1 cm R renal cyst   DENTAL SURGERY  2016   Implants   DEXA  07/03/2003   Nml   ESOPHAGOGASTRODUODENOSCOPY  12/05/1997   Nml (due to hoarseness)   ESOPHAGOGASTRODUODENOSCOPY (EGD) WITH PROPOFOL N/A 02/12/2016    Procedure: ESOPHAGOGASTRODUODENOSCOPY (EGD) WITH PROPOFOL;  Surgeon: Rachael Fee, MD;  Location: [facility];  Service: Endoscopy;  Laterality: N/A;   GALLBLADDER SURGERY     HERNIA REPAIR  01/24/2009   Lap Ventr w/ Lysis of adhesions (Dr. Dwain Sarna)   knee arthroscopic surgery  years ago   right   ROTATOR CUFF REPAIR  1984   Right, [place]   SHOULDER OPEN ROTATOR CUFF REPAIR  02/08/2012   Procedure: ROTATOR CUFF REPAIR SHOULDER OPEN;  Surgeon: Drucilla Schmidt, MD;  Location: [facility];  Service: Orthopedics;  Laterality: Left;  Left Shoulder Open Anterior Acrominectomy Rotator Cuff Repair Open Distal Clavicle Resection ,tissue mend graft, and repair of biceps tendon   THUMB RELEASE  12/1997   Right   TONSILLECTOMY     TOTAL ABDOMINAL HYSTERECTOMY  1985   Due to dysmennorhea   US ECHOCARDIOGRAPHY  06/02/2007     FAMILY HISTORY   Family History  Problem Relation Age of Onset   Heart disease Mother    Thyroid disease Mother    Emphysema Father        One lung   Cystic fibrosis Sister    Hyperthyroidism Sister    Osteoarthritis Brother    Hyperthyroidism Brother    Esophageal cancer Neg Hx    Cancer Neg Hx        Head or neck   Colon cancer Neg Hx    Stomach cancer Neg Hx    Breast cancer Neg Hx      SOCIAL HISTORY   Social History   Tobacco Use   Smoking status: Former    Current packs/day: 0.00    Average packs/day: 2.0 packs/day for 50.0 years (100.0 ttl pk-yrs)    Types: Cigarettes    Start date: 02/06/1955    Quit date: 02/05/2005    Years since quitting: 17.9   Smokeless tobacco: Never  Vaping Use   Vaping status: Never Used  Substance Use Topics   Alcohol use: Yes    Comment: occasionally   Drug use: Never     MEDICATIONS    Home Medication:     Current Medication:  Current Facility-Administered Medications:    acetaminophen (TYLENOL) tablet 650 mg, 650 mg, Oral, TID, Enedina Finner, MD, 650 mg at 01/01/23 0909   albuterol (PROVENTIL)  (2.5 MG/3ML) 0.083% nebulizer solution 3 mL, 3 mL, Inhalation, Q6H PRN, Mansy, Jan A, MD, 3 mL at 12/28/22 1305   amiodarone (PACERONE) tablet 200 mg, 200 mg, Oral, Daily, Mansy, Vernetta Honey, MD, 200  mg at 01/01/23 0909   arformoterol (BROVANA) nebulizer solution 15 mcg, 15 mcg, Nebulization, QHS, Mansy, Jan A, MD, 15 mcg at 12/30/22 2142   budesonide (PULMICORT) nebulizer solution 0.5 mg, 0.5 mg, Nebulization, BID, Mansy, Jan A, MD, 0.5 mg at 01/01/23 0739   busPIRone (BUSPAR) tablet 15 mg, 15 mg, Oral, TID, Mansy, Jan A, MD, 15 mg at 01/01/23 0908   cholecalciferol (VITAMIN D3) 25 MCG (1000 UNIT) tablet 2,000 Units, 2,000 Units, Oral, Daily, Mansy, Jan A, MD, 2,000 Units at 01/01/23 0909   cyanocobalamin (VITAMIN B12) tablet 2,000 mcg, 2,000 mcg, Oral, Daily, Mansy, Jan A, MD, 2,000 mcg at 01/01/23 0909   ezetimibe (ZETIA) tablet 10 mg, 10 mg, Oral, QHS, Mansy, Jan A, MD, 10 mg at 12/31/22 2131   ferrous sulfate tablet 325 mg, 325 mg, Oral, Daily, Mansy, Jan A, MD, 325 mg at 01/01/23 9562   FLUoxetine (PROZAC) capsule 20 mg, 20 mg, Oral, BID, Mansy, Jan A, MD, 20 mg at 01/01/23 0909   fluticasone (FLONASE) 50 MCG/ACT nasal spray 2 spray, 2 spray, Each Nare, Daily, Mansy, Jan A, MD, 2 spray at 01/01/23 1308   guaiFENesin (MUCINEX) 12 hr tablet 1,200 mg, 1,200 mg, Oral, BID PRN, Mansy, Jan A, MD   levothyroxine (SYNTHROID) tablet 125 mcg, 125 mcg, Oral, Q0600, Mansy, Jan A, MD, 125 mcg at 01/01/23 0530   lidocaine (LIDODERM) 5 % 1 patch, 1 patch, Transdermal, Q24H, Mansy, Jan A, MD, 1 patch at 12/31/22 1019   loratadine (CLARITIN) tablet 10 mg, 10 mg, Oral, Daily PRN, Mansy, Jan A, MD   LORazepam (ATIVAN) tablet 0.5 mg, 0.5 mg, Oral, Q4H PRN, Mansy, Jan A, MD, 0.5 mg at 12/31/22 2019   magnesium hydroxide (MILK OF MAGNESIA) suspension 30 mL, 30 mL, Oral, Daily PRN, Mansy, Jan A, MD   morphine (PF) 2 MG/ML injection 2 mg, 2 mg, Intravenous, Q4H PRN, Mansy, Jan A, MD   nitroGLYCERIN (NITROSTAT) SL tablet  0.4 mg, 0.4 mg, Sublingual, Q5 min PRN, Mansy, Jan A, MD   ondansetron (ZOFRAN) tablet 4 mg, 4 mg, Oral, Q6H PRN **OR** ondansetron (ZOFRAN) injection 4 mg, 4 mg, Intravenous, Q6H PRN, Mansy, Jan A, MD   pantoprazole (PROTONIX) EC tablet 40 mg, 40 mg, Oral, Daily, Mansy, Jan A, MD, 40 mg at 01/01/23 0909   potassium chloride (KLOR-CON M) CR tablet 10 mEq, 10 mEq, Oral, Daily, Mansy, Jan A, MD, 10 mEq at 01/01/23 0909   revefenacin (YUPELRI) nebulizer solution 175 mcg, 175 mcg, Nebulization, Daily, Mansy, Jan A, MD, 175 mcg at 01/01/23 0739   rosuvastatin (CRESTOR) tablet 10 mg, 10 mg, Oral, QODAY, Mansy, Jan A, MD, 10 mg at 12/31/22 1018   sodium chloride flush (NS) 0.9 % injection 10 mL, 10 mL, Intravenous, Q12H, Mumma, Shannon, MD, 10 mL at 01/01/23 0910   torsemide (DEMADEX) tablet 20 mg, 20 mg, Oral, Daily PRN, Otelia Sergeant, RPH   traZODone (DESYREL) tablet 25 mg, 25 mg, Oral, QHS PRN, Mansy, Jan A, MD, 25 mg at 12/24/22 2106   vancomycin (VANCOCIN) capsule 125 mg, 125 mg, Oral, QID, Lurene Shadow, MD, 125 mg at 01/01/23 0909    ALLERGIES   Antihistamines, diphenhydramine-type; Incruse ellipta [umeclidinium bromide]; Clarithromycin; Codeine; Diphenhydramine; Pregabalin; Sulfonamide derivatives; Tiotropium bromide monohydrate; Umeclidinium; and Vraylar [cariprazine]     REVIEW OF SYSTEMS    Review of Systems:  Gen:  Denies  fever, sweats, chills weigh loss  HEENT: Denies blurred vision, double vision, ear pain, eye pain, hearing loss,  nose bleeds, sore throat Cardiac:  No dizziness, chest pain or heaviness, chest tightness,edema Resp:   reports dyspnea chronically  Gi: Denies swallowing difficulty, stomach pain, nausea or vomiting, diarrhea, constipation, bowel incontinence Gu:  Denies bladder incontinence, burning urine Ext:   Denies Joint pain, stiffness or swelling Skin: Denies  skin rash, easy bruising or bleeding or hives Endoc:  Denies polyuria, polydipsia , polyphagia  or weight change Psych:   Denies depression, insomnia or hallucinations   Other:  All other systems negative   VS: BP (!) 157/50 (BP Location: Right Arm)   Pulse (!) 59   Temp (!) 97.5 F (36.4 C)   Resp 16   Ht 5\' 5"  (1.651 m)   Wt 99.3 kg   LMP  (LMP Unknown)   SpO2 94%   BMI 36.44 kg/m      PHYSICAL EXAM    GENERAL:NAD, no fevers, chills, no weakness no fatigue HEAD: Normocephalic, atraumatic.  EYES: Pupils equal, round, reactive to light. Extraocular muscles intact. No scleral icterus.  MOUTH: Moist mucosal membrane. Dentition intact. No abscess noted.  EAR, NOSE, THROAT: Clear without exudates. No external lesions.  NECK: Supple. No thyromegaly. No nodules. No JVD.  PULMONARY: decreased breath sounds with mild rhonchi worse at bases bilaterally.  CARDIOVASCULAR: S1 and S2. Regular rate and rhythm. No murmurs, rubs, or gallops. No edema. Pedal pulses 2+ bilaterally.  GASTROINTESTINAL: Soft, nontender, nondistended. No masses. Positive bowel sounds. No hepatosplenomegaly.  MUSCULOSKELETAL: No swelling, clubbing, or edema. Range of motion full in all extremities.  NEUROLOGIC: Cranial nerves II through XII are intact. No gross focal neurological deficits. Sensation intact. Reflexes intact.  SKIN: No ulceration, lesions, rashes, or cyanosis. Skin warm and dry. Turgor intact.  PSYCHIATRIC: Mood, affect within normal limits. The patient is awake, alert and oriented x 3. Insight, judgment intact.       IMAGING   @IMAGES @   ASSESSMENT/PLAN     Advanced copd with chronic hypoxemia and hypercarbia  -Agree with APAP and will obtain appropriate mask  -patient struggles to use this device , we agreed on palliative eval  Acute UTI   As per [facility] , currently on abx      Thank you for allowing me to participate in the care of this patient.   Patient/Family are satisfied with care plan and all questions have been answered.    Provider disclosure: Patient with at  least one acute or chronic illness or injury that poses a threat to life or bodily function and is being managed actively during this encounter.  All of the below services have been performed independently by signing provider:  review of prior documentation from internal and or external health records.  Review of previous and current lab results.  Interview and comprehensive assessment during patient visit today. Review of current and previous chest radiographs/CT scans. Discussion of management and test interpretation with health care team and patient/family.   This document was prepared using Dragon voice recognition software and may include unintentional dictation errors.     Vida Rigger, M.D.  Division of Pulmonary & Critical Care Medicine

## 2023-01-01 NOTE — Consult Note (Signed)
Consultation Note Date: 01/01/2023   Patient Name: Alexandria [name]  DOB: Dec 29, 1935  MRN: [id number]  Age / Sex: 87 y.o., female  PCP: Joaquim Nam, MD Referring Physician: Lurene Shadow, MD  Reason for Consultation: Establishing goals of care   HPI/Brief Hospital Course: 87 y.o. female  with past medical history of advanced stage COPD, aortic stenosis, CAD, A. Fib, T2DM, HTN, HLD, OSA unable to tolerate mask, CKD stage 3b, and T2DM with peripheral neuroapathy admitted from home on Dec 25, 2022 with abdominal pain. Reportedly has had intermittent abdominal pain for 3-4 weeks with associated intermittent diarrhea. Reportedly tested positive for C. Diff prior to admission and started on oral vancomycin.  Admitted and being treated for acute diverticulitis (has completed course of antibiotics), continued oral vancomycin for C. Diff through 10/28, L1 compression fracture (no surgical intervention and has decline TSLO brace), and advanced COPD with recurrent hypercapnia with refusal to wear CPAP/BIPAP due to incomparability.    Palliative medicine was consulted for assisting with goals of care conversations.  Subjective:  Extensive chart review has been completed prior to meeting patient including labs, vital signs, imaging, progress notes, orders, and available advanced directive documents from current and previous encounters.  Visited with Ms. Michela at her bedside, she is resting in bed, will open eyes and respond to calling of her name, drowsy and easily drifts back off to sleep without redirection. Sister-Pam at bedside, Pam calls Ms. Pelissier daughter-[name] and places her on speaker phone.  Introduced myself as a Publishing rights manager as a member of the palliative care team. Explained palliative medicine is specialized medical care for people living with serious illness. It focuses on providing relief from the symptoms and stress of a serious illness. The goal  is to improve quality of life for both the patient and the family.   [name] shares that for the past 6-8 weeks Ms. Fonda has spent her time in the hospital and a short stay at [facility] more than she has been at home. [name] speaks to the functional decline over this time as well.  [name] shares she had conversations with both Dr. Myriam Forehand and Dr. Karna Christmas regarding Ms. Blane's overall condition and both recommended Palliative Care consults. [name] shares her understanding of Ms. Sindoni COPD being at an advanced stage and is aware of the cycle of CO2 retention further complicated by Ms. Benney's inability to tolerate BIPAP mask. We further discussed the chronic disease trajectory of COPD and the anticipate and expected decline as well as recurrent exacerbations and high risk for recurrent infections.  Discussed with [name] between Palliative Medicine and Hospice care as requested. We discussed the overall philosophy of hospice being a focus on aggressively treating and managing symptoms with an understanding that Ms. Jaggi's life would likely be limited. We also discussed the role of outpatient palliative care providers, possibly being able to visit with Ms. Cogliano a few times per month addressing concerns, focusing on symptom management and having ongoing GOC conversations.  Answered [name]'s questions specifically to possible treatments/medications covered under hospice-specifically iron and blood transfusions, shared with [name] these types of interventions are not typically covered under hospice services. We also discussed the possibility of hospice following Ms. Eastep at home vs LTC facility vs IPU. We discussed eligibility criteria for IPU-provided [name] with names of local IPU facilities such as [facility], [facility] as well as [facility]; again emphasized eligibility requirements for IPU admission.  At this time, [name] would like time to discuss with her mother and will  attempt to visit at bedside tomorrow AM.  Provided [name] with PMT team phone to utilize if further questions or concerns arise. Will attempt to meet with [name] at bedside tomorrow.  I discussed importance of continued conversations with family/support persons and all members of their medical team regarding overall plan of care and treatment options ensuring decisions are in alignment with patients goals of care.  All questions/concerns addressed. PMT will continue to follow and support patient as needed.  Objective: Primary Diagnoses: Present on Admission:  Acute diverticulitis  Hypothyroidism  Chronic diastolic CHF (congestive heart failure) (HCC)  Paroxysmal atrial fibrillation (HCC)  C. difficile colitis   Physical Exam Constitutional:      General: She is not in acute distress.    Appearance: She is ill-appearing.     Comments: Drowsy, will respond to calling of name but easily drifts back to sleep without redirection  Pulmonary:     Effort: Pulmonary effort is normal. No respiratory distress.  Skin:    General: Skin is warm and dry.     Vital Signs: BP (!) 157/50 (BP Location: Right Arm)   Pulse (!) 59   Temp (!) 97.5 F (36.4 C)   Resp 16   Ht 5\' 5"  (1.651 m)   Wt 99.3 kg   LMP  (LMP Unknown)   SpO2 94%   BMI 36.44 kg/m  Pain Scale: 0-10   Pain Score: 0-No pain  IO: Intake/output summary:  Intake/Output Summary (Last 24 hours) at 01/01/2023 1423 Last data filed at 01/01/2023 1040 Gross per 24 hour  Intake 480 ml  Output --  Net 480 ml    LBM: Last BM Date : 12/31/22 Baseline Weight: Weight: 99.3 kg Most recent weight: Weight: 99.3 kg      Assessment and Plan  SUMMARY OF RECOMMENDATIONS   Ongoing GOC needed Will attempt to meet with daughter in AM  Palliative Prophylaxis:   Bowel Regimen, Delirium Protocol and Frequent Pain Assessment  Discussed With: Primary team and Dr. Karna Christmas   Thank you for this consult and allowing Palliative Medicine to participate in the care of Olene Craven. Palliative medicine will continue to follow and assist as needed.   Time Total: 75 minutes  Time spent includes: Detailed review of medical records (labs, imaging, vital signs), medically appropriate exam (mental status, respiratory, cardiac, skin), discussed with treatment team, counseling and educating patient, family and staff, documenting clinical information, medication management and coordination of care.   Signed by: Leeanne Deed, DNP, AGNP-C Palliative Medicine    Please contact Palliative Medicine Team phone at [phone] for questions and concerns.  For individual provider: See Loretha Stapler

## 2023-01-01 NOTE — Progress Notes (Signed)
Progress Note    Alexandria [name]  ZOX:[id number] DOB: 19-May-1935  DOA: 12/20/2022 PCP: Alexandria Nam, MD      Brief Narrative:    Medical records reviewed and are as summarized below:  Alexandria [name] is a 87 y.o. female with medical history significant for anxiety, aortic stenosis, COPD, coronary artery disease, type 2 diabetes mellitus, atrial fibrillation, GERD, hypertension, dyslipidemia, hypothyroidism, OSA, CKD stage IIIb, peripheral neuropathy, obesity, who presented to the hospital with abdominal pain.  She said abdominal pain has been going on intermittently for about 3 to 4 weeks.  She has also been having low back pain for about a couple of weeks that has slowly worsened.  There was also report of diarrhea.  Patient however said stools were not watery but reportedly she had bloodstained stool about 3 days prior to admission.  Stools were mostly greenish in color.  There was concern of C. difficile infection and apparently tested positive so she was started on oral vancomycin.       Assessment/Plan:   Principal Problem:   Acute diverticulitis Active Problems:   Acute lower UTI   Closed compression fracture of body of L1 vertebra (HCC)   Hypothyroidism   Paroxysmal atrial fibrillation (HCC)   Chronic diastolic CHF (congestive heart failure) (HCC)   Coronary artery disease   GERD without esophagitis   Dyslipidemia   Anxiety and depression   C. difficile colitis    Body mass index is 36.44 kg/m.  (Obesity)    Acute diverticulitis: Completed antibiotics on 12/29/2022 (4 days of IV ceftriaxone and Flagyl followed by 3 days of Augmentin). Analgesics as needed for pain. Gastric fold thickening on CT,?  Gastritis: Continue Protonix.   Diarrhea: Improved. Stools are more formed than before.  Encouraged adequate oral intake.   C. difficile infection: Patient had a positive C. difficile toxin test on 12/22/2022.  Copy of report was faxed to the unit. Stool  for occult blood, lactoferrin and C.diff molecular (toxin B) were positive.  Reportedly, patient was started on oral vancomycin prior to coming to the hospital.  Continue oral vancomycin through 01/03/2023   Cough: Chest x-ray showed atelectasis.  No other acute abnormality noted.   Acute UTI ruled out: Urine culture showed multiple species.   L1 compression fracture: Analgesics as needed for pain.  Patient declined LTSO brace.  She has been evaluated by neurosurgeon and no surgical intervention required.   Continue PT and OT while inpatient   Chronic diastolic CHF: Compensated.  Restart torsemide (scheduled).     Advance COPD, chronic hypoxemic and hypercapnic respiratory failure, obstructive sleep apnea: Patient has been refusing to use CPAP at night because mask is uncomfortable. ABG done on 12/31/2022 showed pH 7.34, pCO2 61, pO2 94, bicarb 32.7, O2 sat 99.1 on 2 L/min oxygen BiPAP was ordered by pulmonologist.  However, patient is reluctant to use BiPAP because it is uncomfortable.   Other comorbidities include paroxysmal atrial fibrillation (not on long-term anticoagulation because of concern for high bleeding risk), type II DM, CAD, GERD, hypothyroidism, anxiety, depression, dyslipidemia   Plan of care was discussed with patient and his sister) at the bedside and [name] (daughter, over the phone).  Goals of care were discussed.  They understand and that her lung and heart disease will only get worse and not using CPAP/BiPAP will put her at increased risk of complications and readmissions. Dr. Karna Christmas, pulmonologist, came into the room during these discussions. After lengthy discussion with the  patient and her family, they have decided to speak with the palliative care team.  Consulted palliative care.  Diet Order             Diet heart healthy/carb modified Room service appropriate? Yes; Fluid consistency: Thin  Diet effective now                             Consultants: Neurosurgeon Pulmonologist  Procedures: None    Medications:    acetaminophen  650 mg Oral TID   amiodarone  200 mg Oral Daily   arformoterol  15 mcg Nebulization QHS   budesonide  0.5 mg Nebulization BID   busPIRone  15 mg Oral TID   cholecalciferol  2,000 Units Oral Daily   cyanocobalamin  2,000 mcg Oral Daily   ezetimibe  10 mg Oral QHS   ferrous sulfate  325 mg Oral Daily   FLUoxetine  20 mg Oral BID   fluticasone  2 spray Each Nare Daily   levothyroxine  125 mcg Oral Q0600   lidocaine  1 patch Transdermal Q24H   pantoprazole  40 mg Oral Daily   potassium chloride  10 mEq Oral Daily   revefenacin  175 mcg Nebulization Daily   rosuvastatin  10 mg Oral QODAY   sodium chloride flush  10 mL Intravenous Q12H   vancomycin  125 mg Oral QID   Continuous Infusions:     Anti-infectives (From admission, onward)    Start     Dose/Rate Route Frequency Ordered Stop   12/27/22 1000  amoxicillin-clavulanate (AUGMENTIN) 875-125 MG per tablet 1 tablet        1 tablet Oral Every 12 hours 12/26/22 0832 12/29/22 2132   12/25/22 1100  vancomycin (VANCOCIN) capsule 125 mg        125 mg Oral 4 times daily 12/25/22 1005 01/04/23 0959   12/30/2022 2115  cefTRIAXone (ROCEPHIN) 2 g in sodium chloride 0.9 % 100 mL IVPB        2 g 200 mL/hr over 30 Minutes Intravenous Every 24 hours 12/25/2022 2106 12/26/22 2108   12/24/2022 2115  metroNIDAZOLE (FLAGYL) IVPB 500 mg        500 mg 100 mL/hr over 60 Minutes Intravenous Every 8 hours 01/03/2023 2106 12/26/22 2232   12/15/2022 2045  cefTRIAXone (ROCEPHIN) 2 g in sodium chloride 0.9 % 100 mL IVPB  Status:  Discontinued        2 g 200 mL/hr over 30 Minutes Intravenous  Once 01/01/2023 2044 01/06/2023 2149   01/06/2023 2045  metroNIDAZOLE (FLAGYL) IVPB 500 mg  Status:  Discontinued        500 mg 100 mL/hr over 60 Minutes Intravenous  Once 12/17/2022 2044 12/18/2022 2149              Family Communication/Anticipated  D/C date and plan/Code Status   DVT prophylaxis:      Code Status: Full Code  Family Communication: Plan discussed with her sister (at the bedside) and [place], daughter, over the phone Disposition Plan: Plan to discharge to SNF  Status is: Inpatient Remains inpatient appropriate because: Awaiting placement to SNF        Subjective:   Interval events noted.  She had 2 bowel movement yesterday but stools were not watery.  She still has some abdominal pain.  She feels weak and a little short of breath.  Her sister was at the bedside.  Eboni, RN, was also at the  bedside  Objective:    Vitals:   12/31/22 1310 12/31/22 1607 01/01/23 0040 01/01/23 0818  BP:  (!) 134/43 116/88 (!) 157/50  Pulse:  (!) 55 (!) 51 (!) 59  Resp: 16 16 20 16   Temp:  97.7 F (36.5 C) 98 F (36.7 C) (!) 97.5 F (36.4 C)  TempSrc:      SpO2:  94% 93% 94%  Weight:      Height:       No data found.   Intake/Output Summary (Last 24 hours) at 01/01/2023 1026 Last data filed at 12/31/2022 1611 Gross per 24 hour  Intake 240 ml  Output --  Net 240 ml    Filed Weights   12/22/2022 1431  Weight: 99.3 kg    Exam:  GEN: NAD SKIN: Warm and dry EYES: No pallor or icterus ENT: MMM CV: RRR PULM: CTA B ABD: soft, obese, mild mid abdominal tenderness, no rebound tenderness or guarding, +BS CNS: AAO x 3, non focal EXT: Bilateral leg edema.  Chronic erythematous changes on bilateral legs.  No tenderness      Data Reviewed:   I have personally reviewed following labs and imaging studies:  Labs: Labs show the following:   Basic Metabolic Panel: Recent Labs  Lab 12/27/22 0528  NA 142  K 3.9  CL 105  CO2 29  GLUCOSE 104*  BUN 16  CREATININE 1.12*  CALCIUM 8.6*  MG 2.2  PHOS 2.9   GFR Estimated Creatinine Clearance: 42.8 mL/min (A) (by C-G formula based on SCr of 1.12 mg/dL (H)). Liver Function Tests: No results for input(s): "AST", "ALT", "ALKPHOS", "BILITOT", "PROT", "ALBUMIN" in  the last 168 hours.  No results for input(s): "LIPASE", "AMYLASE" in the last 168 hours.  No results for input(s): "AMMONIA" in the last 168 hours. Coagulation profile No results for input(s): "INR", "PROTIME" in the last 168 hours.  CBC: Recent Labs  Lab 12/27/22 0528 12/30/22 1122  WBC 4.3  --   NEUTROABS 3.3  --   HGB 8.6* 8.3*  HCT 28.7*  --   MCV 105.1*  --   PLT 146*  --    Cardiac Enzymes: No results for input(s): "CKTOTAL", "CKMB", "CKMBINDEX", "TROPONINI" in the last 168 hours. BNP (last 3 results) Recent Labs    04/22/22 1624  PROBNP 69.0   CBG: No results for input(s): "GLUCAP" in the last 168 hours. D-Dimer: No results for input(s): "DDIMER" in the last 72 hours. Hgb A1c: No results for input(s): "HGBA1C" in the last 72 hours. Lipid Profile: No results for input(s): "CHOL", "HDL", "LDLCALC", "TRIG", "CHOLHDL", "LDLDIRECT" in the last 72 hours. Thyroid function studies: No results for input(s): "TSH", "T4TOTAL", "T3FREE", "THYROIDAB" in the last 72 hours.  Invalid input(s): "FREET3" Anemia work up: No results for input(s): "VITAMINB12", "FOLATE", "FERRITIN", "TIBC", "IRON", "RETICCTPCT" in the last 72 hours. Sepsis Labs: Recent Labs  Lab 12/27/22 0528  WBC 4.3    Microbiology Recent Results (from the past 240 hour(s))  Urine Culture (for pregnant, neutropenic or urologic patients or patients with an indwelling urinary catheter)     Status: Abnormal   Collection Time: 12/31/2022  7:48 PM   Specimen: Urine, Clean Catch  Result Value Ref Range Status   Specimen Description   Final    URINE, CLEAN CATCH Performed at [facility], [address], [place], [place] [address]    Special Requests   Final    NONE Performed at [facility], [address]  [place], [place] [address]    Culture MULTIPLE SPECIES PRESENT, SUGGEST RECOLLECTION (A)  Final   Report Status 12/25/2022 FINAL  Final  Culture, blood (Routine X 2) w Reflex to ID  Panel     Status: None   Collection Time: 12/24/22  2:00 AM   Specimen: BLOOD LEFT HAND  Result Value Ref Range Status   Specimen Description BLOOD LEFT HAND  Final   Special Requests   Final    BOTTLES DRAWN AEROBIC ONLY Blood Culture results may not be optimal due to an inadequate volume of blood received in culture bottles   Culture   Final    NO GROWTH 5 DAYS Performed at [facility], [address] [place], [place] [address]    Report Status 12/29/2022 FINAL  Final  Culture, blood (Routine X 2) w Reflex to ID Panel     Status: None   Collection Time: 12/24/22  2:00 AM   Specimen: BLOOD RIGHT HAND  Result Value Ref Range Status   Specimen Description BLOOD RIGHT HAND  Final   Special Requests   Final    BOTTLES DRAWN AEROBIC ONLY Blood Culture adequate volume   Culture   Final    NO GROWTH 5 DAYS Performed at [facility], [address] [place], [place] [address]    Report Status 12/29/2022 FINAL  Final    Procedures and diagnostic studies:  DG Chest Port 1 View  Result Date: 12/31/2022 CLINICAL DATA:  Atelectasis in shortness of breath EXAM: PORTABLE CHEST 1 VIEW COMPARISON:  11/30/2022 FINDINGS: Atherosclerotic calcification of the aortic arch. Mitral valve calcifications. Body habitus reduces diagnostic sensitivity and specificity. Ill definition of both hemidiaphragms making it difficult to exclude the possibility of atelectasis or layering pleural effusions although some of this appearance may be due to the reverse lordotic projection. IMPRESSION: 1. Ill definition of both hemidiaphragms making it difficult to exclude the possibility of atelectasis or layering pleural effusions although some of this appearance may be due to the reverse lordotic projection. Consider lateral view if feasible. 2. Aortic Atherosclerosis (ICD10-I70.0). Electronically Signed   By: Gaylyn Rong M.D.   On: 12/31/2022 17:42               LOS: 8 days   Heinz Eckert  [facility]   Pager on [email or web address]. If 7PM-7AM, please contact night-coverage at [email or web address]     01/01/2023, 10:26 AM

## 2023-01-02 DIAGNOSIS — G4733 Obstructive sleep apnea (adult) (pediatric): Secondary | ICD-10-CM | POA: Diagnosis not present

## 2023-01-02 DIAGNOSIS — Z515 Encounter for palliative care: Secondary | ICD-10-CM | POA: Diagnosis not present

## 2023-01-02 DIAGNOSIS — K5792 Diverticulitis of intestine, part unspecified, without perforation or abscess without bleeding: Secondary | ICD-10-CM | POA: Diagnosis not present

## 2023-01-02 DIAGNOSIS — J449 Chronic obstructive pulmonary disease, unspecified: Secondary | ICD-10-CM | POA: Diagnosis not present

## 2023-01-02 NOTE — Plan of Care (Signed)

## 2023-01-02 NOTE — Progress Notes (Signed)
Daily Progress Note   Patient Name: Alexandria [name]       Date: 01/02/2023 DOB: 01/17/1936  Age: 87 y.o. MRN#: [id number] Attending Physician: Lurene Shadow, MD Primary Care Physician: Joaquim Nam, MD Admit Date: 01/02/23  Reason for Consultation/Follow-up: Establishing goals of care  HPI/Brief Hospital Review: 87 y.o. female  with past medical history of advanced stage COPD, aortic stenosis, CAD, A. Fib, T2DM, HTN, HLD, OSA unable to tolerate mask, CKD stage 3b, and T2DM with peripheral neuroapathy admitted from home on 01-02-2023 with abdominal pain. Reportedly has had intermittent abdominal pain for 3-4 weeks with associated intermittent diarrhea. Reportedly tested positive for C. Diff prior to admission and started on oral vancomycin.   Admitted and being treated for acute diverticulitis (has completed course of antibiotics), continued oral vancomycin for C. Diff through 10/28, L1 compression fracture (no surgical intervention and has decline TSLO brace), and advanced COPD with recurrent hypercapnia with refusal to wear CPAP/BIPAP due to incomparability.     Palliative medicine was consulted for assisting with goals of care conversations.  Subjective: Extensive chart review has been completed prior to meeting patient including labs, vital signs, imaging, progress notes, orders, and available advanced directive documents from current and previous encounters.    Visited with Ms. Rollie at her bedside with her sister and daughter-[name] at her bedside. Ms. Jurgenson remains drowsy, able to open eyes and engage in conversation but easily drifts back off to sleep without redirection. During goals of care conversation she engaged as appropriate but daughter-[name] being HCPOA was an active  participant.  From conversations had with daughter-[name] via telephone yesterday had follow up questions regarding hospice services. We again discussed in detail the overall philosophy of hospice being a focus shift to providing comfort and dignity at end of life. We again discussed specific eligibility criteria for IPU such as [facility]. [name] shares she is most interested in Forensic psychologist as a Public librarian and is most interested in her mother being admitted to [facility]. [name] is very clear that she is not interested in utilizing certain medications for comfort such as morphine. [name] does agree to speaking with [facility] hospice liaison regarding further details, services and eligibility criteria.  During our conversations, [name] shares prior to my arrival she discussed Full Code versus Do Not Resuscitate with her  mother and they have agreed upon DNR. We discussed intubation as well and [name] agrees to DNI.  I completed a MOST form today and the signed original was placed in the chart. Each section of options on the form were reviewed in full detail and any questions were answered as needed. The form was scanned and sent to medical records for it to be uploaded under ACP tab in Epic. A photocopy was also placed in the chart to be scanned into EMR. The patient outlined their wishes for the following treatment decisions:  Cardiopulmonary Resuscitation: Do Not Attempt Resuscitation (DNR/No CPR)  Medical Interventions: Limited Additional Interventions: Use medical treatment, IV fluids and cardiac monitoring as indicated, DO NOT USE intubation or mechanical ventilation. May consider use of less invasive airway support such as BiPAP or CPAP. Also provide comfort measures. Transfer to the hospital if indicated. Avoid intensive care.   Antibiotics: Antibiotics if indicated  IV Fluids: IV fluids if indicated  Feeding Tube: Feeding tube for a defined trial period  Regarding feeding tube, she is  unsure at this time if she or Ms. Rigdon would be accepting of PEG but at this time would be accepting of NG placement for artificial nutrition.  [name] makes reference to her and Ms. Robitaille [employer] faith being important to them both. [name] shares her hope for her mother is to be free of pain and suffering.  Answered and addressed all questions and concerns. PMT to continue to follow for ongoing needs and support.   Palliative Care Assessment & Plan   Assessment/Recommendation/Plan  DNR/DNI TOC made aware of daughters choice for [facility] hospice liaison Ongoing GOC needed PMT to continue to follow for ongoing needs and support  Care plan was discussed with primary team, TOC, [facility] and nursing staff.  Thank you for allowing the Palliative Medicine Team to assist in the care of this patient.  Total time:  50 minutes  Time spent includes: Detailed review of medical records (labs, imaging, vital signs), medically appropriate exam (mental status, respiratory, cardiac, skin), discussed with treatment team, counseling and educating patient, family and staff, documenting clinical information, medication management and coordination of care.  Leeanne Deed, DNP, AGNP-C Palliative Medicine   Please contact Palliative Medicine Team phone at [phone] for questions and concerns.

## 2023-01-02 NOTE — Progress Notes (Signed)
PULMONOLOGY         Date: 01/02/2023,   MRN# [id number] Alexandria [name] August 10, 1935     AdmissionWeight: 99.3 kg                 CurrentWeight: 99.3 kg  Referring provider: Dr Denton Lank   CHIEF COMPLAINT:   Acute on chronic hypoxemic respiratory failure   HISTORY OF PRESENT ILLNESS    Patient went to pulmonologist and went to have APAP mask changed but was unable to get correct interface.  She is not able to tolerate current mask interface.  [name] her daughter shares that CO2 levels have been increased and causes her to have encephalopathy.  She also notes claustrophobia with full face mask.   01/01/23- Patient examined at baseline.  She was sitting up today but still with confusion and episodes of encephalopathy.  Patient does not wear cpap mask as instructed and this is partially due to encephalopathy but also due to comorbid advanced age status. We had a conference together with her POA daugther [name] and in presence of attending physician Dr Myriam Forehand.  We agreed that we do not wish to force patient to do things and do not recommend aggressive therapy due to advanced age comorbid status. Patient and family agree to goals of care discussion with Palliative care consultant.  We will not escalate therapy at this time.   01/02/23- patient remains with poor long term prognosis, she is not able to tolerate CPAP/BIPAP interface.  Patient and family are working on goals of care with palliative care service.  I recommend DNR/DNI and consideration for hospice due to recurrent hospitalizations with advanced Emphysema/COPD and chronic hypercapnic hypoxemic respiratory failure.   PAST MEDICAL HISTORY   Past Medical History:  Diagnosis Date   Allergy, unspecified not elsewhere classified    Anxiety    Aortic stenosis, mild 03/30/2022   a. TTE 03/29/2022: EF 55-60, no RWMA, GR 1 DD, normal RVSF, mild aortic stenosis (mean 10, V-max 212 cm/s, DI 0.70)   Cataract    Chronic diastolic  congestive heart failure (HCC)    a. 06/2022 Echo: EF 60-65%, no rwma, nl RV fxn, mild MR, mild AS.   COPD (chronic obstructive pulmonary disease) (HCC) 03/08/1998   PFTs 12/12/2002 FEV 1 1.42 (64%) ratio 58 with no better after B2 and DLCO75%; PFTs 11/20/09 FEV1 1.50 (73%) ratio 50 no better after B2 with DLCO 62%; Hfa 50% 11/20/2009 >75%, 01/13/10 p coaching   Coronary artery calcification seen on CT scan    a. 2012 Neg stress test.   Depression 12/07/1998   Diabetes mellitus type II 02/05/2006   Dr. Elvera Lennox with endo   Disorders of bursae and tendons in shoulder region, unspecified    Rotator cuff syndrome, right   E. coli bacteremia    Esophagitis    GERD (gastroesophageal reflux disease)    History of UTI    Hyperlipidemia 10/04/2000   Hypertension 03/08/1992   Hypothyroidism 03/08/1968   Iron deficiency anemia    Lung nodule    radiation starts 10-08-2019   Malignant neoplasm of right upper lobe of lung (HCC) 07/24/2019   Obesity    NOS   OSA (obstructive sleep apnea)    PSG 01/27/10 AHI 13, pt does not know CPAP settings   PAF (paroxysmal atrial fibrillation) (HCC)    a. 06/2022 in setting of UTI/encephalopathy-->converted on amio; b. CHA2DS2VASc = 6-->No OAC 2/2 chronic anemia/fall risk; c. 06/2022 Zio: Predominantly sinus bradycardia at 54 (  3-158)'s.  1 brief run of SVT.  2.2% PVC burden.   Peripheral neuropathy    Likely due to DM per Dr. Sandria Manly   Renal lesion 12/22/2022   Ventral hernia      SURGICAL HISTORY   Past Surgical History:  Procedure Laterality Date   ABD U/S  03/19/1999   Nml x2 foci in liver   ADENOSINE MYOVIEW  06/02/2007   Nml   CARDIOLITE PERSANTINE  08/24/2000   Nml   CAROTID U/S  08/24/2000   1-39% ICA stenosis   CAROTID U/S  06/02/2007   No apprec change    CARPAL TUNNEL RELEASE  12/1997   Right   CESAREAN SECTION     x2 Breech/ repeat   CHOLECYSTECTOMY  1997   COLONOSCOPY WITH PROPOFOL N/A 02/12/2016   Procedure: COLONOSCOPY WITH PROPOFOL;   Surgeon: Rachael Fee, MD;  Location: [facility];  Service: Endoscopy;  Laterality: N/A;   CT ABD W & PELVIS WO/W CM     Abd hemangiomas of liver, 1 cm R renal cyst   DENTAL SURGERY  2016   Implants   DEXA  07/03/2003   Nml   ESOPHAGOGASTRODUODENOSCOPY  12/05/1997   Nml (due to hoarseness)   ESOPHAGOGASTRODUODENOSCOPY (EGD) WITH PROPOFOL N/A 02/12/2016   Procedure: ESOPHAGOGASTRODUODENOSCOPY (EGD) WITH PROPOFOL;  Surgeon: Rachael Fee, MD;  Location: [facility];  Service: Endoscopy;  Laterality: N/A;   GALLBLADDER SURGERY     HERNIA REPAIR  01/24/2009   Lap Ventr w/ Lysis of adhesions (Dr. Dwain Sarna)   knee arthroscopic surgery  years ago   right   ROTATOR CUFF REPAIR  1984   Right, [place]   SHOULDER OPEN ROTATOR CUFF REPAIR  02/08/2012   Procedure: ROTATOR CUFF REPAIR SHOULDER OPEN;  Surgeon: Drucilla Schmidt, MD;  Location: [facility];  Service: Orthopedics;  Laterality: Left;  Left Shoulder Open Anterior Acrominectomy Rotator Cuff Repair Open Distal Clavicle Resection ,tissue mend graft, and repair of biceps tendon   THUMB RELEASE  12/1997   Right   TONSILLECTOMY     TOTAL ABDOMINAL HYSTERECTOMY  1985   Due to dysmennorhea   US ECHOCARDIOGRAPHY  06/02/2007     FAMILY HISTORY   Family History  Problem Relation Age of Onset   Heart disease Mother    Thyroid disease Mother    Emphysema Father        One lung   Cystic fibrosis Sister    Hyperthyroidism Sister    Osteoarthritis Brother    Hyperthyroidism Brother    Esophageal cancer Neg Hx    Cancer Neg Hx        Head or neck   Colon cancer Neg Hx    Stomach cancer Neg Hx    Breast cancer Neg Hx      SOCIAL HISTORY   Social History   Tobacco Use   Smoking status: Former    Current packs/day: 0.00    Average packs/day: 2.0 packs/day for 50.0 years (100.0 ttl pk-yrs)    Types: Cigarettes    Start date: 02/06/1955    Quit date: 02/05/2005    Years since quitting: 17.9   Smokeless tobacco: Never  Vaping  Use   Vaping status: Never Used  Substance Use Topics   Alcohol use: Yes    Comment: occasionally   Drug use: Never     MEDICATIONS    Home Medication:     Current Medication:  Current Facility-Administered Medications:    acetaminophen (TYLENOL) tablet 650 mg,  650 mg, Oral, TID, Enedina Finner, MD, 650 mg at 01/02/23 0900   albuterol (PROVENTIL) (2.5 MG/3ML) 0.083% nebulizer solution 3 mL, 3 mL, Inhalation, Q6H PRN, Mansy, Jan A, MD, 3 mL at 01/01/23 1814   amiodarone (PACERONE) tablet 200 mg, 200 mg, Oral, Daily, Mansy, Jan A, MD, 200 mg at 01/02/23 0900   arformoterol (BROVANA) nebulizer solution 15 mcg, 15 mcg, Nebulization, QHS, Mansy, Jan A, MD, 15 mcg at 01/01/23 1918   budesonide (PULMICORT) nebulizer solution 0.5 mg, 0.5 mg, Nebulization, BID, Mansy, Jan A, MD, 0.5 mg at 01/02/23 0801   busPIRone (BUSPAR) tablet 15 mg, 15 mg, Oral, TID, Mansy, Jan A, MD, 15 mg at 01/02/23 0900   cholecalciferol (VITAMIN D3) 25 MCG (1000 UNIT) tablet 2,000 Units, 2,000 Units, Oral, Daily, Mansy, Jan A, MD, 2,000 Units at 01/02/23 0900   cyanocobalamin (VITAMIN B12) tablet 2,000 mcg, 2,000 mcg, Oral, Daily, Mansy, Jan A, MD, 2,000 mcg at 01/02/23 0900   ezetimibe (ZETIA) tablet 10 mg, 10 mg, Oral, QHS, Mansy, Jan A, MD, 10 mg at 01/01/23 2209   ferrous sulfate tablet 325 mg, 325 mg, Oral, Daily, Mansy, Jan A, MD, 325 mg at 01/02/23 0900   FLUoxetine (PROZAC) capsule 20 mg, 20 mg, Oral, BID, Mansy, Jan A, MD, 20 mg at 01/02/23 0900   fluticasone (FLONASE) 50 MCG/ACT nasal spray 2 spray, 2 spray, Each Nare, Daily, Mansy, Jan A, MD, 2 spray at 01/02/23 0900   guaiFENesin (MUCINEX) 12 hr tablet 1,200 mg, 1,200 mg, Oral, BID PRN, Mansy, Jan A, MD   levothyroxine (SYNTHROID) tablet 125 mcg, 125 mcg, Oral, Q0600, Mansy, Jan A, MD, 125 mcg at 01/02/23 0521   lidocaine (LIDODERM) 5 % 1 patch, 1 patch, Transdermal, Q24H, Mansy, Jan A, MD, 1 patch at 12/31/22 1019   loratadine (CLARITIN) tablet 10 mg, 10  mg, Oral, Daily PRN, Mansy, Jan A, MD   LORazepam (ATIVAN) tablet 0.5 mg, 0.5 mg, Oral, Q4H PRN, Mansy, Jan A, MD, 0.5 mg at 12/31/22 2019   magnesium hydroxide (MILK OF MAGNESIA) suspension 30 mL, 30 mL, Oral, Daily PRN, Mansy, Jan A, MD   morphine (PF) 2 MG/ML injection 2 mg, 2 mg, Intravenous, Q4H PRN, Mansy, Jan A, MD   nitroGLYCERIN (NITROSTAT) SL tablet 0.4 mg, 0.4 mg, Sublingual, Q5 min PRN, Mansy, Jan A, MD   ondansetron (ZOFRAN) tablet 4 mg, 4 mg, Oral, Q6H PRN **OR** ondansetron (ZOFRAN) injection 4 mg, 4 mg, Intravenous, Q6H PRN, Mansy, Jan A, MD   pantoprazole (PROTONIX) EC tablet 40 mg, 40 mg, Oral, Daily, Mansy, Jan A, MD, 40 mg at 01/02/23 0900   potassium chloride (KLOR-CON M) CR tablet 10 mEq, 10 mEq, Oral, Daily, Mansy, Jan A, MD, 10 mEq at 01/02/23 0900   revefenacin (YUPELRI) nebulizer solution 175 mcg, 175 mcg, Nebulization, Daily, Mansy, Jan A, MD, 175 mcg at 01/02/23 0801   rosuvastatin (CRESTOR) tablet 10 mg, 10 mg, Oral, QODAY, Mansy, Jan A, MD, 10 mg at 01/02/23 0900   sodium chloride flush (NS) 0.9 % injection 10 mL, 10 mL, Intravenous, Q12H, Mumma, Shannon, MD, 10 mL at 01/02/23 0901   torsemide (DEMADEX) tablet 20 mg, 20 mg, Oral, Daily PRN, Otelia Sergeant, RPH   traZODone (DESYREL) tablet 25 mg, 25 mg, Oral, QHS PRN, Mansy, Jan A, MD, 25 mg at 12/24/22 2106   vancomycin (VANCOCIN) capsule 125 mg, 125 mg, Oral, QID, Lurene Shadow, MD, 125 mg at 01/02/23 0900    ALLERGIES   Antihistamines, diphenhydramine-type;  Incruse ellipta [umeclidinium bromide]; Clarithromycin; Codeine; Diphenhydramine; Pregabalin; Sulfonamide derivatives; Tiotropium bromide monohydrate; Umeclidinium; and Vraylar [cariprazine]     REVIEW OF SYSTEMS    Review of Systems:  Gen:  Denies  fever, sweats, chills weigh loss  HEENT: Denies blurred vision, double vision, ear pain, eye pain, hearing loss, nose bleeds, sore throat Cardiac:  No dizziness, chest pain or heaviness, chest  tightness,edema Resp:   reports dyspnea chronically  Gi: Denies swallowing difficulty, stomach pain, nausea or vomiting, diarrhea, constipation, bowel incontinence Gu:  Denies bladder incontinence, burning urine Ext:   Denies Joint pain, stiffness or swelling Skin: Denies  skin rash, easy bruising or bleeding or hives Endoc:  Denies polyuria, polydipsia , polyphagia or weight change Psych:   Denies depression, insomnia or hallucinations   Other:  All other systems negative   VS: BP (!) 141/51 (BP Location: Right Arm)   Pulse 61   Temp 97.7 F (36.5 C)   Resp 19   Ht 5\' 5"  (1.651 m)   Wt 99.3 kg   LMP  (LMP Unknown)   SpO2 100%   BMI 36.44 kg/m      PHYSICAL EXAM    GENERAL:NAD, no fevers, chills, no weakness no fatigue HEAD: Normocephalic, atraumatic.  EYES: Pupils equal, round, reactive to light. Extraocular muscles intact. No scleral icterus.  MOUTH: Moist mucosal membrane. Dentition intact. No abscess noted.  EAR, NOSE, THROAT: Clear without exudates. No external lesions.  NECK: Supple. No thyromegaly. No nodules. No JVD.  PULMONARY: decreased breath sounds with mild rhonchi worse at bases bilaterally.  CARDIOVASCULAR: S1 and S2. Regular rate and rhythm. No murmurs, rubs, or gallops. No edema. Pedal pulses 2+ bilaterally.  GASTROINTESTINAL: Soft, nontender, nondistended. No masses. Positive bowel sounds. No hepatosplenomegaly.  MUSCULOSKELETAL: No swelling, clubbing, or edema. Range of motion full in all extremities.  NEUROLOGIC: Cranial nerves II through XII are intact. No gross focal neurological deficits. Sensation intact. Reflexes intact.  SKIN: No ulceration, lesions, rashes, or cyanosis. Skin warm and dry. Turgor intact.  PSYCHIATRIC: Mood, affect within normal limits. The patient is awake, alert and oriented x 3. Insight, judgment intact.       IMAGING   @IMAGES @   ASSESSMENT/PLAN     Advanced copd with chronic hypoxemia and hypercarbia  -Agree with  APAP and will obtain appropriate mask  -patient struggles to use this device , we agreed on palliative eval  Acute UTI   As per [facility] , currently on abx      Thank you for allowing me to participate in the care of this patient.   Patient/Family are satisfied with care plan and all questions have been answered.    Provider disclosure: Patient with at least one acute or chronic illness or injury that poses a threat to life or bodily function and is being managed actively during this encounter.  All of the below services have been performed independently by signing provider:  review of prior documentation from internal and or external health records.  Review of previous and current lab results.  Interview and comprehensive assessment during patient visit today. Review of current and previous chest radiographs/CT scans. Discussion of management and test interpretation with health care team and patient/family.   This document was prepared using Dragon voice recognition software and may include unintentional dictation errors.     Vida Rigger, M.D.  Division of Pulmonary & Critical Care Medicine

## 2023-01-02 NOTE — Progress Notes (Signed)
[facility]LECT[facility] Hospice Referral  Received request from Kemper Durie, RN Transitions of Care Manager, for assessment for Inpatient Hospice Unit [facility]).   Spoke with Garrison Columbus, patient's daughter via phone, to initiate education related to hospice philosophy, services, and team approach to care. Patient/family verbalized understanding of information given.  At this time, patient does not meet criteria for IPU.  No acute  symptom management needs and patient is not actively transitioning to EOL.  [name] states she can not care for patient in homecare setting.  Discussed hospice at ALF and LTC setting.  She states she had a bed offer from [facility], but they called her and declined previous bed offer.  Kemper Durie, RN, [facility] notified of daughter request for help with discharge disposition location.  Hospital liaison team will continue to follow through final disposition and can reassess patient for IPU if condition declines or changes.  Please send signed and completed DNR home with patient/family if applicable.  Please provide prescriptions at discharge as needed to ensure ongoing symptom management.  [facility] information and contact numbers given to [name]. Above information shared with Kemper Durie, RN, Transitions of Care Manager and hospital medical team.   Please call with any hospice related questions or concerns. Thank you for the opportunity to participate in this patient's care.   Norris Cross, RN Nurse Liaison [phone]

## 2023-01-02 NOTE — Progress Notes (Addendum)
Progress Note    ANNESSA REICHOW  GNF:[id number] DOB: 07-11-1935  DOA: 12-25-22 PCP: Joaquim Nam, MD      Brief Narrative:    Medical records reviewed and are as summarized below:  Alexandria [name] is a 87 y.o. female with medical history significant for anxiety, aortic stenosis, COPD, coronary artery disease, type 2 diabetes mellitus, atrial fibrillation, GERD, hypertension, dyslipidemia, hypothyroidism, OSA, CKD stage IIIb, peripheral neuropathy, obesity, who presented to the hospital with abdominal pain.  She said abdominal pain has been going on intermittently for about 3 to 4 weeks.  She has also been having low back pain for about a couple of weeks that has slowly worsened.  There was also report of diarrhea.  Patient however said stools were not watery but reportedly she had bloodstained stool about 3 days prior to admission.  Stools were mostly greenish in color.  There was concern of C. difficile infection and apparently tested positive so she was started on oral vancomycin.       Assessment/Plan:   Principal Problem:   Acute diverticulitis Active Problems:   COPD (chronic obstructive pulmonary disease) (HCC)   Closed compression fracture of body of L1 vertebra (HCC)   Hypothyroidism   Paroxysmal atrial fibrillation (HCC)   Obstructive sleep apnea   Chronic diastolic CHF (congestive heart failure) (HCC)   Coronary artery disease   GERD without esophagitis   Dyslipidemia   Anxiety and depression   C. difficile colitis    Body mass index is 36.44 kg/m.  (Obesity)    Acute diverticulitis: Completed antibiotics on 12/29/2022 (4 days of IV ceftriaxone and Flagyl followed by 3 days of Augmentin). Analgesics as needed for pain. Gastric fold thickening on CT,?  Gastritis: Continue Protonix.   Diarrhea: Improved. Stools are more formed than before.  Encouraged adequate oral intake.   C. difficile infection: Patient had a positive C. difficile toxin  test on 12/22/2022.  Copy of report was faxed to the unit. Stool for occult blood, lactoferrin and C.diff molecular (toxin B) were positive.  Reportedly, patient was started on oral vancomycin prior to coming to the hospital.  Plan to finish 10-day course of oral vancomycin on 01/03/2023.   Cough: Chest x-ray showed atelectasis.  No other acute abnormality noted.   Acute UTI ruled out: Urine culture showed multiple species.   L1 compression fracture: Analgesics as needed for pain.  Patient declined LTSO brace.  She has been evaluated by neurosurgeon and no surgical intervention required.   Continue PT and OT while inpatient   Chronic diastolic CHF: Compensated.  Restart torsemide (scheduled).     Advance COPD, chronic hypoxemic and hypercapnic respiratory failure, obstructive sleep apnea: Patient has been refusing to use CPAP at night because mask is uncomfortable. ABG done on 12/31/2022 showed pH 7.34, pCO2 61, pO2 94, bicarb 32.7, O2 sat 99.1 on 2 L/min oxygen BiPAP was ordered by pulmonologist.  However, patient is reluctant to use BiPAP because it is uncomfortable.   Other comorbidities include paroxysmal atrial fibrillation (not on long-term anticoagulation because of concern for high bleeding risk), type II DM, CAD, GERD, hypothyroidism, anxiety, depression, dyslipidemia   Plan of care was discussed with Pam (sister at the bedside), and [name] (daughter over the phone).  Continue had questions about the severity of COPD and carbon monoxide.  I explained ABG results obtained on 11/28/2022, 11/30/2022 and 12/31/2022.  Results indicate hypercapnic respiratory failure, chronic carbon oxide retention with normal pH and advanced  COPD.  She said she wanted to speak with Ladona Ridgel, NP, with palliative care again today.  After discussions with palliative care team, [name] has decided to speak with the hospice liaison.    Diet Order             Diet heart healthy/carb modified Room service  appropriate? Yes; Fluid consistency: Thin  Diet effective now                            Consultants: Neurosurgeon Pulmonologist  Procedures: None    Medications:    acetaminophen  650 mg Oral TID   amiodarone  200 mg Oral Daily   arformoterol  15 mcg Nebulization QHS   budesonide  0.5 mg Nebulization BID   busPIRone  15 mg Oral TID   cholecalciferol  2,000 Units Oral Daily   cyanocobalamin  2,000 mcg Oral Daily   ezetimibe  10 mg Oral QHS   ferrous sulfate  325 mg Oral Daily   FLUoxetine  20 mg Oral BID   fluticasone  2 spray Each Nare Daily   levothyroxine  125 mcg Oral Q0600   lidocaine  1 patch Transdermal Q24H   pantoprazole  40 mg Oral Daily   potassium chloride  10 mEq Oral Daily   revefenacin  175 mcg Nebulization Daily   rosuvastatin  10 mg Oral QODAY   sodium chloride flush  10 mL Intravenous Q12H   vancomycin  125 mg Oral QID   Continuous Infusions:     Anti-infectives (From admission, onward)    Start     Dose/Rate Route Frequency Ordered Stop   12/27/22 1000  amoxicillin-clavulanate (AUGMENTIN) 875-125 MG per tablet 1 tablet        1 tablet Oral Every 12 hours 12/26/22 0832 12/29/22 2132   12/25/22 1100  vancomycin (VANCOCIN) capsule 125 mg        125 mg Oral 4 times daily 12/25/22 1005 01/04/23 0959   29-Dec-2022 2115  cefTRIAXone (ROCEPHIN) 2 g in sodium chloride 0.9 % 100 mL IVPB        2 g 200 mL/hr over 30 Minutes Intravenous Every 24 hours 2022/12/29 2106 12/26/22 2108   12/29/2022 2115  metroNIDAZOLE (FLAGYL) IVPB 500 mg        500 mg 100 mL/hr over 60 Minutes Intravenous Every 8 hours 12/29/2022 2106 12/26/22 2232   12/29/22 2045  cefTRIAXone (ROCEPHIN) 2 g in sodium chloride 0.9 % 100 mL IVPB  Status:  Discontinued        2 g 200 mL/hr over 30 Minutes Intravenous  Once 12-29-22 2044 12-29-2022 2149   29-Dec-2022 2045  metroNIDAZOLE (FLAGYL) IVPB 500 mg  Status:  Discontinued        500 mg 100 mL/hr over 60 Minutes Intravenous  Once  12-29-2022 2044 2022/12/29 2149              Family Communication/Anticipated D/C date and plan/Code Status   DVT prophylaxis:      Code Status: Limited: Do not attempt resuscitation (DNR) -DNR-LIMITED -Do Not Intubate/DNI   Family Communication: Plan discussed with Pam, sister (at the bedside) and [name], daughter, over the phone Disposition Plan: Plan to discharge to SNF  Status is: Inpatient Remains inpatient appropriate because: Awaiting placement to SNF        Subjective:   Interval events noted.  Patient says she is feeling "sleepy".  Pam, sister, at the bedside said patient was given Ativan  earlier today but I do not see any record of Ativan administration overnight or this morning.  No abdominal pain.  Objective:    Vitals:   01/02/23 0309 01/02/23 0804 01/02/23 0812 01/02/23 1320  BP: (!) 133/41  (!) 141/51 (!) 104/29  Pulse: (!) 59  61 60  Resp: 18  19 18   Temp: 97.7 F (36.5 C)  97.7 F (36.5 C) (!) 97.5 F (36.4 C)  TempSrc:      SpO2: 96% 96% 100% 93%  Weight:      Height:       No data found.   Intake/Output Summary (Last 24 hours) at 01/02/2023 1341 Last data filed at 01/01/2023 1942 Gross per 24 hour  Intake 640 ml  Output --  Net 640 ml    Filed Weights   2022/12/30 1431  Weight: 99.3 kg    Exam:   GEN: NAD SKIN: Warm and dry EYES: No pallor or icterus ENT: MMM CV: RRR PULM: CTA B ABD: soft, obese, nontender, +BS CNS: Drowsy but arousable, non focal EXT: Edema of bilateral legs better today.  No tenderness.  Chronic erythematous changes on bilateral legs      Data Reviewed:   I have personally reviewed following labs and imaging studies:  Labs: Labs show the following:   Basic Metabolic Panel: Recent Labs  Lab 12/27/22 0528  NA 142  K 3.9  CL 105  CO2 29  GLUCOSE 104*  BUN 16  CREATININE 1.12*  CALCIUM 8.6*  MG 2.2  PHOS 2.9   GFR Estimated Creatinine Clearance: 42.8 mL/min (A) (by C-G formula based  on SCr of 1.12 mg/dL (H)). Liver Function Tests: No results for input(s): "AST", "ALT", "ALKPHOS", "BILITOT", "PROT", "ALBUMIN" in the last 168 hours.  No results for input(s): "LIPASE", "AMYLASE" in the last 168 hours.  No results for input(s): "AMMONIA" in the last 168 hours. Coagulation profile No results for input(s): "INR", "PROTIME" in the last 168 hours.  CBC: Recent Labs  Lab 12/27/22 0528 12/30/22 1122  WBC 4.3  --   NEUTROABS 3.3  --   HGB 8.6* 8.3*  HCT 28.7*  --   MCV 105.1*  --   PLT 146*  --    Cardiac Enzymes: No results for input(s): "CKTOTAL", "CKMB", "CKMBINDEX", "TROPONINI" in the last 168 hours. BNP (last 3 results) Recent Labs    04/22/22 1624  PROBNP 69.0   CBG: No results for input(s): "GLUCAP" in the last 168 hours. D-Dimer: No results for input(s): "DDIMER" in the last 72 hours. Hgb A1c: No results for input(s): "HGBA1C" in the last 72 hours. Lipid Profile: No results for input(s): "CHOL", "HDL", "LDLCALC", "TRIG", "CHOLHDL", "LDLDIRECT" in the last 72 hours. Thyroid function studies: No results for input(s): "TSH", "T4TOTAL", "T3FREE", "THYROIDAB" in the last 72 hours.  Invalid input(s): "FREET3" Anemia work up: No results for input(s): "VITAMINB12", "FOLATE", "FERRITIN", "TIBC", "IRON", "RETICCTPCT" in the last 72 hours. Sepsis Labs: Recent Labs  Lab 12/27/22 0528  WBC 4.3    Microbiology Recent Results (from the past 240 hour(s))  Urine Culture (for pregnant, neutropenic or urologic patients or patients with an indwelling urinary catheter)     Status: Abnormal   Collection Time: 12-30-22  7:48 PM   Specimen: Urine, Clean Catch  Result Value Ref Range Status   Specimen Description   Final    URINE, CLEAN CATCH Performed at [facility], [address] [place], [place] [address]    Special Requests  Final    NONE Performed at [facility], [address] [place], [place] [address]    Culture MULTIPLE  SPECIES PRESENT, SUGGEST RECOLLECTION (A)  Final   Report Status 12/25/2022 FINAL  Final  Culture, blood (Routine X 2) w Reflex to ID Panel     Status: None   Collection Time: 12/24/22  2:00 AM   Specimen: BLOOD LEFT HAND  Result Value Ref Range Status   Specimen Description BLOOD LEFT HAND  Final   Special Requests   Final    BOTTLES DRAWN AEROBIC ONLY Blood Culture results may not be optimal due to an inadequate volume of blood received in culture bottles   Culture   Final    NO GROWTH 5 DAYS Performed at [facility], [address] [place], [place] [address]    Report Status 12/29/2022 FINAL  Final  Culture, blood (Routine X 2) w Reflex to ID Panel     Status: None   Collection Time: 12/24/22  2:00 AM   Specimen: BLOOD RIGHT HAND  Result Value Ref Range Status   Specimen Description BLOOD RIGHT HAND  Final   Special Requests   Final    BOTTLES DRAWN AEROBIC ONLY Blood Culture adequate volume   Culture   Final    NO GROWTH 5 DAYS Performed at [facility], [address] [place], [place] [address]    Report Status 12/29/2022 FINAL  Final    Procedures and diagnostic studies:  DG Chest Port 1 View  Result Date: 12/31/2022 CLINICAL DATA:  Atelectasis in shortness of breath EXAM: PORTABLE CHEST 1 VIEW COMPARISON:  11/30/2022 FINDINGS: Atherosclerotic calcification of the aortic arch. Mitral valve calcifications. Body habitus reduces diagnostic sensitivity and specificity. Ill definition of both hemidiaphragms making it difficult to exclude the possibility of atelectasis or layering pleural effusions although some of this appearance may be due to the reverse lordotic projection. IMPRESSION: 1. Ill definition of both hemidiaphragms making it difficult to exclude the possibility of atelectasis or layering pleural effusions although some of this appearance may be due to the reverse lordotic projection. Consider lateral view if feasible. 2. Aortic Atherosclerosis  (ICD10-I70.0). Electronically Signed   By: Gaylyn Rong M.D.   On: 12/31/2022 17:42               LOS: 9 days   Jammy Plotkin  [facility]   Pager on [email or web address]. If 7PM-7AM, please contact night-coverage at [email or web address]     01/02/2023, 1:41 PM

## 2023-01-02 NOTE — Plan of Care (Signed)
  Problem: Clinical Measurements: Goal: Ability to maintain clinical measurements within normal limits will improve Outcome: Progressing Goal: Will remain free from infection Outcome: Progressing Goal: Diagnostic test results will improve Outcome: Progressing   Problem: Activity: Goal: Risk for activity intolerance will decrease Outcome: Progressing   Problem: Elimination: Goal: Will not experience complications related to urinary retention Outcome: Progressing   Problem: Pain Managment: Goal: General experience of comfort will improve Outcome: Progressing

## 2023-01-03 ENCOUNTER — Inpatient Hospital Stay: Payer: Medicare Other

## 2023-01-03 ENCOUNTER — Inpatient Hospital Stay: Payer: Medicare Other | Admitting: Nurse Practitioner

## 2023-01-03 DIAGNOSIS — Z7189 Other specified counseling: Secondary | ICD-10-CM | POA: Diagnosis not present

## 2023-01-03 DIAGNOSIS — K5792 Diverticulitis of intestine, part unspecified, without perforation or abscess without bleeding: Secondary | ICD-10-CM | POA: Diagnosis not present

## 2023-01-03 LAB — BLOOD GAS, ARTERIAL
Acid-Base Excess: 4.9 mmol/L — ABNORMAL HIGH (ref 0.0–2.0)
Bicarbonate: 32.7 mmol/L — ABNORMAL HIGH (ref 20.0–28.0)
Drawn by: 30136
O2 Content: 2 L/min
O2 Saturation: 99.1 %
Patient temperature: 36.4
pCO2 arterial: 61 mm[Hg] — ABNORMAL HIGH (ref 32–48)
pH, Arterial: 7.34 — ABNORMAL LOW (ref 7.35–7.45)
pO2, Arterial: 94 mmHg (ref 83–108)

## 2023-01-03 LAB — BASIC METABOLIC PANEL
Anion gap: 4 — ABNORMAL LOW (ref 5–15)
BUN: 12 mg/dL (ref 8–23)
CO2: 32 mmol/L (ref 22–32)
Calcium: 8.3 mg/dL — ABNORMAL LOW (ref 8.9–10.3)
Chloride: 105 mmol/L (ref 98–111)
Creatinine, Ser: 0.84 mg/dL (ref 0.44–1.00)
GFR, Estimated: >60 mL/min (ref >60)
Glucose, Bld: 125 mg/dL — ABNORMAL HIGH (ref 70–99)
Potassium: 4.1 mmol/L (ref 3.5–5.1)
Sodium: 141 mmol/L (ref 135–145)

## 2023-01-03 LAB — CBC WITH DIFFERENTIAL/PLATELET
Abs Immature Granulocytes: 0.11 10*3/uL — ABNORMAL HIGH (ref 0.00–0.07)
Basophils Absolute: 0 10*3/uL (ref 0.0–0.1)
Basophils Relative: 0 %
Eosinophils Absolute: 0 10*3/uL (ref 0.0–0.5)
Eosinophils Relative: 1 %
HCT: 26.9 % — ABNORMAL LOW (ref 36.0–46.0)
Hemoglobin: 8.2 g/dL — ABNORMAL LOW (ref 12.0–15.0)
Immature Granulocytes: 2 %
Lymphocytes Relative: 13 %
Lymphs Abs: 0.6 10*3/uL — ABNORMAL LOW (ref 0.7–4.0)
MCH: 32.4 pg (ref 26.0–34.0)
MCHC: 30.5 g/dL (ref 30.0–36.0)
MCV: 106.3 fL — ABNORMAL HIGH (ref 80.0–100.0)
Monocytes Absolute: 0.4 10*3/uL (ref 0.1–1.0)
Monocytes Relative: 8 %
Neutro Abs: 3.7 10*3/uL (ref 1.7–7.7)
Neutrophils Relative %: 76 %
Platelets: 188 10*3/uL (ref 150–400)
RBC: 2.53 MIL/uL — ABNORMAL LOW (ref 3.87–5.11)
RDW: 17.3 % — ABNORMAL HIGH (ref 11.5–15.5)
WBC: 4.9 10*3/uL (ref 4.0–10.5)
nRBC: 0.8 % — ABNORMAL HIGH (ref 0.0–0.2)

## 2023-01-03 LAB — MAGNESIUM: Magnesium: 2.3 mg/dL (ref 1.7–2.4)

## 2023-01-03 LAB — PHOSPHORUS: Phosphorus: 3 mg/dL (ref 2.5–4.6)

## 2023-01-03 MED ORDER — ALUM & MAG HYDROXIDE-SIMETH 200-200-20 MG/5ML PO SUSP
15.0000 mL | Freq: Four times a day (QID) | ORAL | Status: DC | PRN
  Administered 2023-01-03: 15 mL via ORAL
  Filled 2023-01-03: qty 30

## 2023-01-03 NOTE — Plan of Care (Signed)
  Problem: Education: Goal: Knowledge of General Education information will improve Description: Including pain rating scale, medication(s)/side effects and non-pharmacologic comfort measures Outcome: Progressing   Problem: Health Behavior/Discharge Planning: Goal: Ability to manage health-related needs will improve Outcome: Progressing   Problem: Clinical Measurements: Goal: Diagnostic test results will improve Outcome: Progressing   Problem: Activity: Goal: Risk for activity intolerance will decrease Outcome: Progressing   Problem: Nutrition: Goal: Adequate nutrition will be maintained Outcome: Progressing   Problem: Coping: Goal: Level of anxiety will decrease Outcome: Progressing   

## 2023-01-03 NOTE — Progress Notes (Signed)
Occupational Therapy Treatment Patient Details Name: Alexandria [name] MRN: [id number] DOB: 07-05-1935 Today's Date: 01/03/2023   History of present illness Alexandria [name] is a 87 y.o. female with medical history significant for anxiety, aortic stenosis, COPD, CAD, Type II DM, AFIB, GERD, HTN, dyslipidemia, hypothyroidism, OSA, CKD stage IIIb, peripheral neuropathy, obesity, who presented to the hospital with abdominal pain.  Positive for C. Diff, compression fx of L1 vertebra (TLSO for comfort) and CHF.   OT comments  Pt seen for OT treatment on this date. Upon arrival to room pt seated in chair, agreeable to tx. Pt's sister present at the beginning and stepped out for session. Pt completed a sit<>stand t/f with MOD A +RW (4W). Pt completed a stand pivot to [facility] with CGA +RW. Pt required Max A with hygiene and clothing management. Per pt request, pt t/f from [facility] to bed. Pt took a 4-5 steps back and side steps towards [facility] with CGA +RW. Pt returned to bed and completed a sit>supine t/f with Max A. Pt left supine in bed with call bell within reach and bed alarm. Pt making good progress toward goals, will continue to follow POC. Discharge recommendation remains appropriate.        If plan is discharge home, recommend the following:  A little help with walking and/or transfers;A little help with bathing/dressing/bathroom;Assistance with cooking/housework;Assist for transportation;Help with stairs or ramp for entrance;Direct supervision/assist for financial management;Direct supervision/assist for medications management   Equipment Recommendations  Other (comment) (Defer next venue of care.)    Recommendations for Other Services      Precautions / Restrictions Precautions Precautions: Fall Restrictions Weight Bearing Restrictions: No       Mobility Bed Mobility   Bed Mobility: Sit to Supine     Supine to sit: Max assist       Patient Response: Cooperative  Transfers Overall transfer  level: Needs assistance Equipment used: Rollator (4 wheels) Transfers: Sit to/from Stand Sit to Stand: Mod assist                 Balance Overall balance assessment: Needs assistance Sitting-balance support: Feet supported, Single extremity supported Sitting balance-[name]: Good     Standing balance support: Reliant on assistive device for balance, During functional activity, Bilateral upper extremity supported Standing balance-[name]: Fair                             ADL either performed or assessed with clinical judgement   ADL Overall ADL's : Needs assistance/impaired                                     Functional mobility during ADLs: Contact guard assist;Maximal assistance;Rolling walker (2 wheels) General ADL Comments: Pt completed a stand pivot to [facility] with CGA +RW. Pt required Max A with hygiene and clothing management.    Extremity/Trunk Assessment Upper Extremity Assessment Upper Extremity Assessment: Generalized weakness   Lower Extremity Assessment Lower Extremity Assessment: Generalized weakness        Vision Baseline Vision/History: 1 Wears glasses Patient Visual Report: No change from baseline     Perception     Praxis      Cognition Arousal: Alert Behavior During Therapy: WFL for tasks assessed/performed Overall Cognitive Status: Within Functional Limits for tasks assessed  Exercises      Shoulder Instructions       General Comments      Pertinent Vitals/ Pain       Pain Assessment Pain Assessment: No/denies pain  Home Living                                          Prior Functioning/Environment              Frequency  Min 1X/week        Progress Toward Goals  OT Goals(current goals can now be found in the care plan section)  Progress towards OT goals: Progressing toward goals     Plan       Co-evaluation                 AM-PAC OT "6 Clicks" Daily Activity     Outcome Measure   Help from another person eating meals?: None Help from another person taking care of personal grooming?: A Little Help from another person toileting, which includes using toliet, bedpan, or urinal?: A Little Help from another person bathing (including washing, rinsing, drying)?: A Little Help from another person to put on and taking off regular upper body clothing?: A Little Help from another person to put on and taking off regular lower body clothing?: A Lot 6 Click Score: 18    End of Session Equipment Utilized During Treatment: Gait belt;Oxygen;Rollator (4 wheels)  OT Visit Diagnosis: Unsteadiness on feet (R26.81);Muscle weakness (generalized) (M62.81);History of falling (Z91.81)   Activity Tolerance Patient tolerated treatment well   Patient Left with call bell/phone within reach;in bed;with bed alarm set;with family/visitor present   Nurse Communication Mobility status        Time: 3664-4034 OT Time Calculation (min): 15 min  Charges:    Butch Penny, SOT

## 2023-01-03 NOTE — Progress Notes (Addendum)
Progress Note    Alexandria [name]  ZOX:[id number] DOB: Jun 04, 1935  DOA: 22-Jan-2023 PCP: Joaquim Nam, MD      Brief Narrative:    Medical records reviewed and are as summarized below:  Alexandria [name] is a 87 y.o. female with medical history significant for anxiety, aortic stenosis, COPD, coronary artery disease, type 2 diabetes mellitus, atrial fibrillation, GERD, hypertension, dyslipidemia, hypothyroidism, OSA, CKD stage IIIb, peripheral neuropathy, obesity, who presented to the hospital with abdominal pain.  She said abdominal pain has been going on intermittently for about 3 to 4 weeks.  She has also been having low back pain for about a couple of weeks that has slowly worsened.  There was also report of diarrhea.  Patient however said stools were not watery but reportedly she had bloodstained stool about 3 days prior to admission.  Stools were mostly greenish in color.  There was concern of C. difficile infection and apparently tested positive so she was started on oral vancomycin.       Assessment/Plan:   Principal Problem:   Acute diverticulitis Active Problems:   COPD (chronic obstructive pulmonary disease) (HCC)   Closed compression fracture of body of L1 vertebra (HCC)   Hypothyroidism   Paroxysmal atrial fibrillation (HCC)   Obstructive sleep apnea   Chronic diastolic CHF (congestive heart failure) (HCC)   Coronary artery disease   GERD without esophagitis   Dyslipidemia   Anxiety and depression   C. difficile colitis    Body mass index is 36.44 kg/m.  (Obesity)    Acute diverticulitis: Completed antibiotics on 12/29/2022 (4 days of IV ceftriaxone and Flagyl followed by 3 days of Augmentin). Analgesics as needed for pain. Gastric fold thickening on CT,?  Gastritis: Continue Protonix. GERD: Continue Protonix.  Add Maalox as needed.   Diarrhea: Improved. Stools are more formed than before.  Encouraged adequate oral intake.   C. difficile  infection: Patient had a positive C. difficile toxin test on 12/22/2022.  Copy of report was faxed to the unit. Stool for occult blood, lactoferrin and C.diff molecular (toxin B) were positive.  Reportedly, patient was started on oral vancomycin prior to coming to the hospital.  Plan to complete 10 days of oral vancomycin on 01/03/2023   Cough: Improved.  Chest x-ray on 12/31/2022 showed atelectasis.  No other acute abnormality noted.   Acute UTI ruled out: Urine culture showed multiple species.   L1 compression fracture: Analgesics as needed for pain.  Patient declined LTSO brace.  She has been evaluated by neurosurgeon and no surgical intervention required.   Continue PT and OT while inpatient   Chronic diastolic CHF: Compensated.  Continue torsemide.  Creatinine has normalized.   Advance COPD, chronic hypoxemic and hypercapnic respiratory failure, obstructive sleep apnea: Patient has been refusing to use CPAP or BiPAP at night because mask is uncomfortable. ABG done on 12/31/2022 showed pH 7.34, pCO2 61, pO2 94, bicarb 32.7, O2 sat 99.1 on 2 L/min oxygen   Other comorbidities include paroxysmal atrial fibrillation (not on long-term anticoagulation because of concern for high bleeding risk), type II DM, CAD, GERD, hypothyroidism, anxiety, depression, dyslipidemia   Advanced age with chronic debility and multiple comorbidities: No acute issues at this time.  She is medically stable for discharge.   Plan of care was discussed with Pam (sister at the bedside), and [name] (daughter over the phone). [name] said they are interested in hospice and we are hoping that patient could get into inpatient  hospice home.  However, she has been told by the hospice liaison that patient is not a candidate for inpatient hospice home at this time.  [name], requested to speak with case manager to discuss disposition plans.  Deliliah, case manager, has been notified. Deliliah had called [name] to discuss  disposition plans but she requested a call back because she was in a meeting.     Diet Order             Diet heart healthy/carb modified Room service appropriate? Yes; Fluid consistency: Thin  Diet effective now                            Consultants: Neurosurgeon Pulmonologist  Procedures: None    Medications:    acetaminophen  650 mg Oral TID   amiodarone  200 mg Oral Daily   arformoterol  15 mcg Nebulization QHS   budesonide  0.5 mg Nebulization BID   busPIRone  15 mg Oral TID   cholecalciferol  2,000 Units Oral Daily   ezetimibe  10 mg Oral QHS   ferrous sulfate  325 mg Oral Daily   FLUoxetine  20 mg Oral BID   fluticasone  2 spray Each Nare Daily   levothyroxine  125 mcg Oral Q0600   lidocaine  1 patch Transdermal Q24H   pantoprazole  40 mg Oral Daily   potassium chloride  10 mEq Oral Daily   revefenacin  175 mcg Nebulization Daily   rosuvastatin  10 mg Oral QODAY   sodium chloride flush  10 mL Intravenous Q12H   vancomycin  125 mg Oral QID   Continuous Infusions:     Anti-infectives (From admission, onward)    Start     Dose/Rate Route Frequency Ordered Stop   12/27/22 1000  amoxicillin-clavulanate (AUGMENTIN) 875-125 MG per tablet 1 tablet        1 tablet Oral Every 12 hours 12/26/22 0832 12/29/22 2132   12/25/22 1100  vancomycin (VANCOCIN) capsule 125 mg        125 mg Oral 4 times daily 12/25/22 1005 01/04/23 0959   01-21-23 2115  cefTRIAXone (ROCEPHIN) 2 g in sodium chloride 0.9 % 100 mL IVPB        2 g 200 mL/hr over 30 Minutes Intravenous Every 24 hours 01-21-23 2106 12/26/22 2108   01/21/23 2115  metroNIDAZOLE (FLAGYL) IVPB 500 mg        500 mg 100 mL/hr over 60 Minutes Intravenous Every 8 hours 01-21-23 2106 12/26/22 2232   01-21-23 2045  cefTRIAXone (ROCEPHIN) 2 g in sodium chloride 0.9 % 100 mL IVPB  Status:  Discontinued        2 g 200 mL/hr over 30 Minutes Intravenous  Once 2023/01/21 2044 01-21-2023 2149   21-Jan-2023 2045   metroNIDAZOLE (FLAGYL) IVPB 500 mg  Status:  Discontinued        500 mg 100 mL/hr over 60 Minutes Intravenous  Once January 21, 2023 2044 01/21/2023 2149              Family Communication/Anticipated D/C date and plan/Code Status   DVT prophylaxis:      Code Status: Limited: Do not attempt resuscitation (DNR) -DNR-LIMITED -Do Not Intubate/DNI   Family Communication: Plan discussed with Pam, sister (at the bedside) and [name], daughter, over the phone Disposition Plan: Plan to discharge to SNF  Status is: Inpatient Remains inpatient appropriate because: Awaiting placement to SNF  Subjective:   No acute events overnight.  She said she slept well last night.  She did not use the CPAP or BiPAP last night.  She complains of feeling sleepy.  She also reports sensation of food coming up from the stomach into the esophagus but she is not vomiting.  No diarrhea.  Abdominal pain is better.  She has history of GERD.  Pam, sister, was at the bedside and as always [name], was on speaker phone during this encounter.  Objective:    Vitals:   01/02/23 2019 01/02/23 2136 01/03/23 0829 01/03/23 0833  BP:  (!) 128/37 (!) 135/43   Pulse:  62 (!) 56   Resp:  20 16   Temp:  98.3 F (36.8 C)    TempSrc:      SpO2: 95% 97% 95% 95%  Weight:      Height:       No data found.  No intake or output data in the 24 hours ending 01/03/23 1412   Filed Weights   01/03/23 1431  Weight: 99.3 kg    Exam:   GEN: NAD SKIN: Warm and dry EYES: No pallor or icterus ENT: MMM CV: RRR PULM: CTA B ABD: soft, obese, nontender, +BS CNS: Drowsy but arousable, non focal EXT: Edema of bilateral legs better today.  No tenderness.  Chronic erythematous changes on bilateral legs      Data Reviewed:   I have personally reviewed following labs and imaging studies:  Labs: Labs show the following:   Basic Metabolic Panel: Recent Labs  Lab 01/03/23 0429  NA 141  K 4.1  CL 105  CO2 32   GLUCOSE 125*  BUN 12  CREATININE 0.84  CALCIUM 8.3*  MG 2.3  PHOS 3.0   GFR Estimated Creatinine Clearance: 57.1 mL/min (by C-G formula based on SCr of 0.84 mg/dL). Liver Function Tests: No results for input(s): "AST", "ALT", "ALKPHOS", "BILITOT", "PROT", "ALBUMIN" in the last 168 hours.  No results for input(s): "LIPASE", "AMYLASE" in the last 168 hours.  No results for input(s): "AMMONIA" in the last 168 hours. Coagulation profile No results for input(s): "INR", "PROTIME" in the last 168 hours.  CBC: Recent Labs  Lab 12/30/22 1122 01/03/23 0429  WBC  --  4.9  NEUTROABS  --  3.7  HGB 8.3* 8.2*  HCT  --  26.9*  MCV  --  106.3*  PLT  --  188   Cardiac Enzymes: No results for input(s): "CKTOTAL", "CKMB", "CKMBINDEX", "TROPONINI" in the last 168 hours. BNP (last 3 results) Recent Labs    04/22/22 1624  PROBNP 69.0   CBG: No results for input(s): "GLUCAP" in the last 168 hours. D-Dimer: No results for input(s): "DDIMER" in the last 72 hours. Hgb A1c: No results for input(s): "HGBA1C" in the last 72 hours. Lipid Profile: No results for input(s): "CHOL", "HDL", "LDLCALC", "TRIG", "CHOLHDL", "LDLDIRECT" in the last 72 hours. Thyroid function studies: No results for input(s): "TSH", "T4TOTAL", "T3FREE", "THYROIDAB" in the last 72 hours.  Invalid input(s): "FREET3" Anemia work up: No results for input(s): "VITAMINB12", "FOLATE", "FERRITIN", "TIBC", "IRON", "RETICCTPCT" in the last 72 hours. Sepsis Labs: Recent Labs  Lab 01/03/23 0429  WBC 4.9    Microbiology No results found for this or any previous visit (from the past 240 hour(s)).   Procedures and diagnostic studies:  No results found.             LOS: 10 days   Rosemarie Galvis  [facility]  Pager on [email or web address]. If 7PM-7AM, please contact night-coverage at [email or web address]     01/03/2023, 2:12 PM

## 2023-01-03 NOTE — Plan of Care (Signed)

## 2023-01-03 NOTE — Care Management Important Message (Signed)
Important Message  Patient Details  Name: BREONA KOLB MRN: [id number] Date of Birth: 03-09-1935   Important Message Given:  Yes - Medicare IM     Olegario Messier A Nohemi Nicklaus 01/03/2023, 2:55 PM

## 2023-01-03 NOTE — TOC Progression Note (Signed)
Transition of Care [facility]) - Progression Note    Patient Details  Name: Alexandria [name] MRN: [id number] Date of Birth: 28-Sep-1935  Transition of Care [facility]) CM/SW Contact  Marlowe Sax, RN Phone Number: 01/03/2023, 12:38 PM  Clinical Narrative:     Went in to the room and spoke with the sister and the patient, the sister is returning the CPAP to [facility], I called to speak with the daughter [name] to answer her questions and she stated that she is going into a meeting and would like a call back at [phone]  Expected Discharge Plan: Skilled Nursing Facility Barriers to Discharge: SNF Pending bed offer  Expected Discharge Plan and Services   Discharge Planning Services: CM Consult   Living arrangements for the past 2 months: Assisted Living Facility                   DME Agency: NA       HH Arranged: NA           Social Determinants of Health (SDOH) Interventions SDOH Screenings   Food Insecurity: No Food Insecurity (12/24/2022)  Housing: Low Risk  (12/24/2022)  Transportation Needs: No Transportation Needs (12/24/2022)  Utilities: Not At Risk (12/24/2022)  Alcohol Screen: Low Risk  (10/21/2022)  Depression (PHQ2-9): Low Risk  (10/21/2022)  Recent Concern: Depression (PHQ2-9) - Medium Risk (08/03/2022)  Financial Resource Strain: Low Risk  (10/21/2022)  Physical Activity: Insufficiently Active (10/21/2022)  Social Connections: Socially Isolated (10/21/2022)  Stress: No Stress Concern Present (10/21/2022)  Tobacco Use: Medium Risk (12/30/2022)  Health Literacy: Adequate Health Literacy (10/21/2022)    Readmission Risk Interventions    12/28/2022    3:01 PM 11/18/2022    4:47 PM  Readmission Risk Prevention Plan  Transportation Screening Complete Complete  Medication Review Oceanographer) Complete Complete  PCP or Specialist appointment within 3-5 days of discharge Complete   HRI or Home Care Consult Complete Complete  SW Recovery Care/Counseling Consult Complete  Complete  Palliative Care Screening Not Applicable Not Applicable  Skilled Nursing Facility Complete Complete

## 2023-01-03 NOTE — TOC Progression Note (Signed)
Transition of Care [facility]) - Progression Note    Patient Details  Name: Alexandria [name] MRN: [id number] Date of Birth: 19-Feb-1936  Transition of Care [facility]) CM/SW Contact  Marlowe Sax, RN Phone Number: 01/03/2023, 4:42 PM  Clinical Narrative:     Spoke with the patient's daughter Cathrine Muster is not able to accept the patient, the daughter tells me that [facility] would be an option for STR, They are expecting [facility] to come and assess, afterwards if they will not accept then we will review bed offers once obtained from [facility] STR facilities  Expected Discharge Plan: Skilled Nursing Facility Barriers to Discharge: SNF Pending bed offer  Expected Discharge Plan and Services   Discharge Planning Services: CM Consult   Living arrangements for the past 2 months: Assisted Living Facility                   DME Agency: NA       HH Arranged: NA           Social Determinants of Health (SDOH) Interventions SDOH Screenings   Food Insecurity: No Food Insecurity (12/24/2022)  Housing: Low Risk  (12/24/2022)  Transportation Needs: No Transportation Needs (12/24/2022)  Utilities: Not At Risk (12/24/2022)  Alcohol Screen: Low Risk  (10/21/2022)  Depression (PHQ2-9): Low Risk  (10/21/2022)  Recent Concern: Depression (PHQ2-9) - Medium Risk (08/03/2022)  Financial Resource Strain: Low Risk  (10/21/2022)  Physical Activity: Insufficiently Active (10/21/2022)  Social Connections: Socially Isolated (10/21/2022)  Stress: No Stress Concern Present (10/21/2022)  Tobacco Use: Medium Risk (12/30/2022)  Health Literacy: Adequate Health Literacy (10/21/2022)    Readmission Risk Interventions    12/28/2022    3:01 PM 11/18/2022    4:47 PM  Readmission Risk Prevention Plan  Transportation Screening Complete Complete  Medication Review (RN Care Manager) Complete Complete  PCP or Specialist appointment within 3-5 days of discharge Complete   HRI or Home Care Consult  Complete Complete  SW Recovery Care/Counseling Consult Complete Complete  Palliative Care Screening Not Applicable Not Applicable  Skilled Nursing Facility Complete Complete

## 2023-01-03 NOTE — Progress Notes (Addendum)
Daily Progress Note   Patient Name: Alexandria [name]       Date: 01/03/2023 DOB: 12-Sep-1935  Age: 87 y.o. MRN#: [id number] Attending Physician: Lurene Shadow, MD Primary Care Physician: Joaquim Nam, MD Admit Date: 12/13/2022  Reason for Consultation/Follow-up: Establishing goals of care  Subjective: Notes and labs reviewed.  In to see patient.  She is currently sitting at bedside with sister Pam present.  She states she is grateful for her sister, daughter and other family member's support and assistance.   Patient discusses that she really wants to go home, and understand she will need help.  With conversation she states that she does not trust people to come into her home and provide care for her.  She states her family would not be able to be present to provide care for her.  She states given this, she would be amenable to facility placement.  She states she is strong in her [employer] faith and feels that she does not have a lot of time left.  We discussed comfort and dignity.  She is hopeful to be able to move to a facility to live moving forward.  Patient's friend who is a pastor came to visit patient, PMT stepped out.  TOC to continue working on placement.   Length of Stay: 10  Current Medications: Scheduled Meds:   acetaminophen  650 mg Oral TID   amiodarone  200 mg Oral Daily   arformoterol  15 mcg Nebulization QHS   budesonide  0.5 mg Nebulization BID   busPIRone  15 mg Oral TID   cholecalciferol  2,000 Units Oral Daily   ezetimibe  10 mg Oral QHS   ferrous sulfate  325 mg Oral Daily   FLUoxetine  20 mg Oral BID   fluticasone  2 spray Each Nare Daily   levothyroxine  125 mcg Oral Q0600   lidocaine  1 patch Transdermal Q24H   pantoprazole  40 mg Oral Daily   potassium  chloride  10 mEq Oral Daily   revefenacin  175 mcg Nebulization Daily   rosuvastatin  10 mg Oral QODAY   sodium chloride flush  10 mL Intravenous Q12H   vancomycin  125 mg Oral QID    Continuous Infusions:   PRN Meds: albuterol, alum & mag hydroxide-simeth, guaiFENesin, loratadine, LORazepam, magnesium  hydroxide, morphine injection, nitroGLYCERIN, ondansetron **OR** ondansetron (ZOFRAN) IV, torsemide, traZODone  Physical Exam Pulmonary:     Comments: On oxygen by nasal cannula.   Neurological:     Mental Status: She is alert.             Vital Signs: BP (!) 135/43 (BP Location: Right Arm)   Pulse (!) 56   Temp 98.3 F (36.8 C)   Resp 16   Ht 5\' 5"  (1.651 m)   Wt 99.3 kg   LMP  (LMP Unknown)   SpO2 95%   BMI 36.44 kg/m  SpO2: SpO2: 95 % O2 Device: O2 Device: Nasal Cannula O2 Flow Rate: O2 Flow Rate (L/min): 2 L/min  Intake/output summary: No intake or output data in the 24 hours ending 01/03/23 1422 LBM: Last BM Date : 01/03/23 Baseline Weight: Weight: 99.3 kg Most recent weight: Weight: 99.3 kg    Patient Active Problem List   Diagnosis Date Noted   C. difficile colitis 12/25/2022   Acute diverticulitis 12/28/22   Closed compression fracture of body of L1 vertebra (HCC) December 28, 2022   Coronary artery disease Dec 28, 2022   GERD without esophagitis 2022/12/28   Dyslipidemia 12-28-2022   Anxiety and depression 2022/12/28   Back pain 12/22/2022   Renal lesion 12/22/2022   Acute respiratory disease due to COVID-19 virus 11/29/2022   Acute on chronic respiratory failure with hypercapnia (HCC) 11/28/2022   COPD (chronic obstructive pulmonary disease) (HCC) 11/28/2022   Depression with anxiety 11/28/2022   Obesity (BMI 30-39.9) 11/28/2022   Hypernatremia 11/23/2022   Confusion 11/21/2022   Constipation 11/21/2022   Symptomatic anemia 11/17/2022   Acute on chronic diastolic CHF (congestive heart failure) (HCC) 11/17/2022   COPD with acute exacerbation (HCC)  11/17/2022   Goals of care, counseling/discussion 10/05/2022   Stage 3b chronic kidney disease (HCC) 10/05/2022   Neurogenic orthostatic hypotension (HCC) 07/13/2022   Non-pressure chronic ulcer of other part of right lower leg with other specified severity (HCC) 07/13/2022   Atherosclerotic heart disease of native coronary artery with other forms of angina pectoris (HCC) 07/13/2022   Chronic diastolic (congestive) heart failure (HCC) 07/07/2022   Paroxysmal atrial fibrillation (HCC) 07/02/2022   Acute on chronic respiratory failure with hypoxia and hypercapnia (HCC) 06/29/2022   Acute encephalopathy 06/29/2022   Aortic stenosis, mild 03/30/2022   PVC's (premature ventricular contractions) 02/23/2022   Skin lesion 08/12/2021   Leg swelling 06/08/2021   Grief 05/27/2021   H/O laceration of skin 05/27/2021   PND (post-nasal drip) 04/27/2021   Wheezing 04/27/2021   Acute cough 04/27/2021   Physical deconditioning 01/30/2020   Healthcare maintenance 01/30/2020   PAD (peripheral artery disease) (HCC) 01/27/2020   Sore throat 11/21/2019   GERD (gastroesophageal reflux disease) 11/21/2019   CAD (coronary artery disease) 07/24/2019   Malignant neoplasm of right upper lobe of lung (HCC) 07/24/2019   Vitamin D deficiency 06/29/2018   Hyperkalemia 06/28/2018   Dysuria 01/01/2018   Abnormal findings on diagnostic imaging of lung 12/18/2017   Rhinitis 12/18/2017   Knee laceration, right, subsequent encounter 10/06/2016   Knee pain, acute 10/06/2016   Chronic pain of left knee 09/15/2016   Right foot pain 09/15/2016   Chronic respiratory failure with hypoxia (HCC) 07/14/2016   Diverticulosis of colon without hemorrhage    Hemorrhoids    Hiatal hernia    Type 2 diabetes mellitus with diabetic neuropathy, without long-term current use of insulin (HCC) 04/04/2015   Left temporal headache 03/14/2015   Vertigo 03/14/2015  Chest tightness 12/04/2014   Cystitis 11/03/2014   Acute on chronic  heart failure with preserved ejection fraction (HFpEF) (HCC)    Iron deficiency anemia    OSA on CPAP    Obesity hypoventilation syndrome (HCC)    Chronic diastolic CHF (congestive heart failure) (HCC)    UTI (urinary tract infection)    Acute on chronic blood loss anemia    Medicare annual wellness visit, subsequent 06/14/2014   Advance care planning 06/14/2014   RLQ abdominal pain 05/10/2014   COPD exacerbation (HCC) 04/28/2014   Lower extremity edema 09/15/2012   DOE (dyspnea on exertion) 08/24/2012   Shoulder pain 12/17/2011   Abdominal hernia 12/17/2011   Anxiety 09/23/2011   Osteopenia 07/25/2011   Obstructive sleep apnea 02/09/2010   Murmur 02/04/2010   CHEST PAIN 01/13/2010   PALPITATIONS 08/29/2008   ESOPHAGEAL STENOSIS 11/22/2007   DYSPHAGIA 11/08/2007   ESOPHAGITIS 10/11/2007   Edema 07/03/2007   COPD with chronic bronchitis and emphysema (HCC) 05/23/2007   ALLERGY 07/25/2006   NUMBNESS 10/06/2001   Hyperlipemia 10/04/2000   Depression, recurrent (HCC) 03/08/1997   Essential hypertension 03/08/1992   Obesity, Class III, BMI 40-49.9 (morbid obesity) (HCC) 03/08/1989   ROTATOR CUFF SYNDROME, RIGHT 03/08/1982   Hypothyroidism 03/08/1968    Palliative Care Assessment & Plan     Recommendations/Plan: TOC working on placement patient.  Code Status:    Code Status Orders  (From admission, onward)           Start     Ordered   01/02/23 1304  Do not attempt resuscitation (DNR)- Limited -Do Not Intubate (DNI)  (Code Status)  Continuous       Question Answer Comment  If pulseless and not breathing No CPR or chest compressions.   In Pre-Arrest Conditions (Patient Is Breathing and Has A Pulse) Do not intubate. Provide all appropriate non-invasive medical interventions. Avoid ICU transfer unless indicated or required.   Consent: Discussion documented in EHR or advanced directives reviewed      01/02/23 1303           Code Status History     Date  Active Date Inactive Code Status Order ID Comments User Context   12/17/2022 2100 01/02/2023 1303 Full Code [id number]  Hannah Beat, MD ED   11/28/2022 2227 12/02/2022 2122 Full Code [id number]  Lorretta Harp, MD ED   11/16/2022 2152 11/26/2022 1941 Full Code [id number]  Andris Baumann, MD ED   06/29/2022 1852 07/07/2022 1941 Full Code [id number]  Verdene Lennert, MD ED   09/27/2014 1819 10/02/2014 1903 Full Code [id number]  Lupita Leash, MD Inpatient      Advance Directive Documentation    Flowsheet Row Most Recent Value  Type of Advance Directive Living will  Pre-existing out of facility DNR order (yellow form or pink MOST form) --  "MOST" Form in Place? --       Prognosis: Poor overall   Thank you for allowing the Palliative Medicine Team to assist in the care of this patient.    Morton Stall, NP  Please contact Palliative Medicine Team phone at [phone] for questions and concerns.

## 2023-01-04 DIAGNOSIS — K5792 Diverticulitis of intestine, part unspecified, without perforation or abscess without bleeding: Secondary | ICD-10-CM | POA: Diagnosis not present

## 2023-01-04 NOTE — Progress Notes (Signed)
PT Cancellation Note  Patient Details Name: Alexandria [name] MRN: [id number] DOB: [birth year]   Cancelled Treatment:     Pt received up in chair, son in-law at bedside. Pt stated she has decided to go home on hospice and declined any PT this date. Will re-attempt next available date/time.   Jannet Askew 01/04/2023, 1:28 PM

## 2023-01-04 NOTE — Progress Notes (Addendum)
Progress Note    Alexandria [name]  OVF:[id number] DOB: 1936/02/28  DOA: 01/08/23 PCP: Joaquim Nam, MD      Brief Narrative:    Medical records reviewed and are as summarized below:  Alexandria [name] is a 87 y.o. female with medical history significant for anxiety, aortic stenosis, COPD, coronary artery disease, type 2 diabetes mellitus, atrial fibrillation, GERD, hypertension, dyslipidemia, hypothyroidism, OSA, CKD stage IIIb, peripheral neuropathy, obesity, who presented to the hospital with abdominal pain.  She said abdominal pain has been going on intermittently for about 3 to 4 weeks.  She has also been having low back pain for about a couple of weeks that has slowly worsened.  There was also report of diarrhea.  Patient however said stools were not watery but reportedly she had bloodstained stool about 3 days prior to admission.  Stools were mostly greenish in color.  There was concern of C. difficile infection and apparently tested positive so she was started on oral vancomycin.       Assessment/Plan:   Principal Problem:   Acute diverticulitis Active Problems:   COPD (chronic obstructive pulmonary disease) (HCC)   Closed compression fracture of body of L1 vertebra (HCC)   Hypothyroidism   Paroxysmal atrial fibrillation (HCC)   Obstructive sleep apnea   Chronic diastolic CHF (congestive heart failure) (HCC)   Coronary artery disease   GERD without esophagitis   Dyslipidemia   Anxiety and depression   C. difficile colitis    Body mass index is 36.44 kg/m.  (Obesity)    Acute diverticulitis: Completed antibiotics on 12/29/2022 (4 days of IV ceftriaxone and Flagyl followed by 3 days of Augmentin). Analgesics as needed for pain. Gastric fold thickening on CT,?  Gastritis: Continue Protonix. GERD: Protonix and Maalox as needed   Diarrhea: Improved.    C. difficile infection: Patient had a positive C. difficile toxin test on 12/22/2022.  Copy of report  was faxed to the unit. Stool for occult blood, lactoferrin and C.diff molecular (toxin B) were positive.  Completed 10 days of oral vancomycin on 01/03/2023   Cough: Improved.  Chest x-ray on 12/31/2022 showed atelectasis.  No other acute abnormality noted.   Acute UTI ruled out: Urine culture showed multiple species.   L1 compression fracture: Analgesics as needed for pain.  Patient declined LTSO brace.  She has been evaluated by neurosurgeon and no surgical intervention required.   Continue PT and OT while inpatient   Chronic diastolic CHF: Compensated.  Continue torsemide.  Creatinine has normalized.   Advance COPD, chronic hypoxemic and hypercapnic respiratory failure, obstructive sleep apnea: Patient has been refusing to use CPAP or BiPAP at night because mask is uncomfortable. ABG done on 12/31/2022 showed pH 7.34, pCO2 61, pO2 94, bicarb 32.7, O2 sat 99.1 on 2 L/min oxygen   Other comorbidities include paroxysmal atrial fibrillation (not on long-term anticoagulation because of concern for high bleeding risk), type II DM, CAD, GERD, hypothyroidism, anxiety, depression, dyslipidemia   Advanced age with chronic debility and multiple comorbidities: No acute issues at this time.  She is medically stable for discharge.   Plan of care was discussed with [name], daughter, over the phone.  She said that there are aware that patient has expressed concern that end-of-life is near and that is nothing new to them.  She had spoken with Deliliah, case manager, yesterday.  Transfer of care team to assist with placement.    Diet Order  Diet heart healthy/carb modified Room service appropriate? Yes; Fluid consistency: Thin  Diet effective now                            Consultants: Neurosurgeon Pulmonologist  Procedures: None    Medications:    acetaminophen  650 mg Oral TID   amiodarone  200 mg Oral Daily   arformoterol  15 mcg Nebulization QHS    budesonide  0.5 mg Nebulization BID   busPIRone  15 mg Oral TID   cholecalciferol  2,000 Units Oral Daily   ezetimibe  10 mg Oral QHS   ferrous sulfate  325 mg Oral Daily   FLUoxetine  20 mg Oral BID   fluticasone  2 spray Each Nare Daily   levothyroxine  125 mcg Oral Q0600   lidocaine  1 patch Transdermal Q24H   pantoprazole  40 mg Oral Daily   potassium chloride  10 mEq Oral Daily   revefenacin  175 mcg Nebulization Daily   rosuvastatin  10 mg Oral QODAY   sodium chloride flush  10 mL Intravenous Q12H   Continuous Infusions:     Anti-infectives (From admission, onward)    Start     Dose/Rate Route Frequency Ordered Stop   12/27/22 1000  amoxicillin-clavulanate (AUGMENTIN) 875-125 MG per tablet 1 tablet        1 tablet Oral Every 12 hours 12/26/22 0832 12/29/22 2132   12/25/22 1100  vancomycin (VANCOCIN) capsule 125 mg        125 mg Oral 4 times daily 12/25/22 1005 01/03/23 2118   December 27, 2022 2115  cefTRIAXone (ROCEPHIN) 2 g in sodium chloride 0.9 % 100 mL IVPB        2 g 200 mL/hr over 30 Minutes Intravenous Every 24 hours 12-27-22 2106 12/26/22 2108   2022-12-27 2115  metroNIDAZOLE (FLAGYL) IVPB 500 mg        500 mg 100 mL/hr over 60 Minutes Intravenous Every 8 hours 12/27/2022 2106 12/26/22 2232   12-27-22 2045  cefTRIAXone (ROCEPHIN) 2 g in sodium chloride 0.9 % 100 mL IVPB  Status:  Discontinued        2 g 200 mL/hr over 30 Minutes Intravenous  Once 12-27-22 2044 12/27/2022 2149   2022-12-27 2045  metroNIDAZOLE (FLAGYL) IVPB 500 mg  Status:  Discontinued        500 mg 100 mL/hr over 60 Minutes Intravenous  Once December 27, 2022 2044 2022/12/27 2149              Family Communication/Anticipated D/C date and plan/Code Status   DVT prophylaxis:      Code Status: Limited: Do not attempt resuscitation (DNR) -DNR-LIMITED -Do Not Intubate/DNI   Family Communication: Plan discussed with [name], daughter, over the phone Disposition Plan: Plan to discharge to SNF  Status is:  Inpatient Remains inpatient appropriate because: Awaiting placement to SNF        Subjective:   Interval events noted.  "I feel like I'm at the end of life".  She complains of shortness of breath. No chest pain or abdominal pain.  No bowel movement yesterday.  She had not had any bowel movement today as of the time of my visit this morning. Adeyemi, LPN, was at the bedside  Objective:    Vitals:   01/03/23 1624 01/04/23 0004 01/04/23 0148 01/04/23 0811  BP: (!) 132/39 (!) 100/41  (!) 153/47  Pulse: 68 62  64  Resp:    20  Temp: 97.8 F (36.6 C) 97.9 F (36.6 C)  97.6 F (36.4 C)  TempSrc:      SpO2: 90%  92% 93%  Weight:      Height:       No data found.   Intake/Output Summary (Last 24 hours) at 01/04/2023 1100 Last data filed at 01/04/2023 1024 Gross per 24 hour  Intake 360 ml  Output --  Net 360 ml     Filed Weights   12/07/2022 1431  Weight: 99.3 kg    Exam:  GEN: NAD SKIN: Warm and dry EYES: No pallor or icterus ENT: MMM CV: RRR PULM: b/l wheezing ABD: soft, obese, NT, +BS CNS: AAO x 3, non focal EXT: Chronic erythema on bilateral legs.  No edema or tenderness.    Data Reviewed:   I have personally reviewed following labs and imaging studies:  Labs: Labs show the following:   Basic Metabolic Panel: Recent Labs  Lab 01/03/23 0429  NA 141  K 4.1  CL 105  CO2 32  GLUCOSE 125*  BUN 12  CREATININE 0.84  CALCIUM 8.3*  MG 2.3  PHOS 3.0   GFR Estimated Creatinine Clearance: 57.1 mL/min (by C-G formula based on SCr of 0.84 mg/dL). Liver Function Tests: No results for input(s): "AST", "ALT", "ALKPHOS", "BILITOT", "PROT", "ALBUMIN" in the last 168 hours.  No results for input(s): "LIPASE", "AMYLASE" in the last 168 hours.  No results for input(s): "AMMONIA" in the last 168 hours. Coagulation profile No results for input(s): "INR", "PROTIME" in the last 168 hours.  CBC: Recent Labs  Lab 12/30/22 1122 01/03/23 0429  WBC  --  4.9   NEUTROABS  --  3.7  HGB 8.3* 8.2*  HCT  --  26.9*  MCV  --  106.3*  PLT  --  188   Cardiac Enzymes: No results for input(s): "CKTOTAL", "CKMB", "CKMBINDEX", "TROPONINI" in the last 168 hours. BNP (last 3 results) Recent Labs    04/22/22 1624  PROBNP 69.0   CBG: No results for input(s): "GLUCAP" in the last 168 hours. D-Dimer: No results for input(s): "DDIMER" in the last 72 hours. Hgb A1c: No results for input(s): "HGBA1C" in the last 72 hours. Lipid Profile: No results for input(s): "CHOL", "HDL", "LDLCALC", "TRIG", "CHOLHDL", "LDLDIRECT" in the last 72 hours. Thyroid function studies: No results for input(s): "TSH", "T4TOTAL", "T3FREE", "THYROIDAB" in the last 72 hours.  Invalid input(s): "FREET3" Anemia work up: No results for input(s): "VITAMINB12", "FOLATE", "FERRITIN", "TIBC", "IRON", "RETICCTPCT" in the last 72 hours. Sepsis Labs: Recent Labs  Lab 01/03/23 0429  WBC 4.9    Microbiology No results found for this or any previous visit (from the past 240 hour(s)).   Procedures and diagnostic studies:  No results found.             LOS: 11 days   Kace Hartje  [facility] [employer] loss adjuster on [email or web address]. If 7PM-7AM, please contact night-coverage at [email or web address]     01/04/2023, 11:00 AM

## 2023-01-04 NOTE — Progress Notes (Signed)
Found patient on 3LPM sating 63%. Neb given and placed patient on 6LPM. Patient sating 89-90% on 6LPM. Talked to daughter and explained to her that I had to increase oxygen due to low sats. Patient is refusing to wear home cpap machine and hospital bipap machine. Daughter made aware and stated "she is aware and we are done with trying to get her to wear the machines". RN made aware and is going to message MD.

## 2023-01-04 NOTE — Plan of Care (Signed)

## 2023-01-04 NOTE — Progress Notes (Signed)
[facility] Liaison team will follow peripherally while awaiting a discharge disposition.       Please don't hesitate to call with any Hospice related questions or concerns.    Thank you for the opportunity to participate in this patient's care. [facility] Liaison [phone]

## 2023-01-05 DIAGNOSIS — K5792 Diverticulitis of intestine, part unspecified, without perforation or abscess without bleeding: Secondary | ICD-10-CM | POA: Diagnosis not present

## 2023-01-05 NOTE — Progress Notes (Signed)
[facility]  - [place] [facility]   PATIENT NAME: Alexandria [name]    MR#:  [id number]  DATE OF BIRTH:  [birth year]  SUBJECTIVE:  patient laying in bed today. Dter on Speak phone,. Visiting family member at bedside VITALS:  Blood pressure (!) 124/31, pulse (!) 58, temperature 98 F (36.7 C), resp. rate 19, height 5\' 5"  (1.651 m), weight 99.3 kg, SpO2 (!) 88%.  PHYSICAL EXAMINATION:   GENERAL:  87 y.o.-year-old patient with no acute distress. Morbidly obese LUNGS: decreased breath sounds bilaterally, no wheezing CARDIOVASCULAR: S1, S2 normal.  ABDOMEN: Soft, diffuse tender, nondistended. No guarding rigidity EXTREMITIES: +  edema b/l.   Bruise 2 spots over the left leg with small blister Left thumb skin tear + NEUROLOGIC: nonfocal  patient is alert and awake, weak  LABORATORY PANEL:  CBC Recent Labs  Lab 01/03/23 0429  WBC 4.9  HGB 8.2*  HCT 26.9*  PLT 188    Chemistries  Recent Labs  Lab 01/03/23 0429  NA 141  K 4.1  CL 105  CO2 32  GLUCOSE 125*  BUN 12  CREATININE 0.84  CALCIUM 8.3*  MG 2.3   Assessment and Plan  Alexandria [name] is a 87 y.o. female with medical history significant for anxiety, aortic stenosis, COPD, coronary artery disease, type 2 diabetes mellitus, atrial fibrillation, GERD, hypertension, dyslipidemia, hypothyroidism, OSA, CKD stage IIIb, peripheral neuropathy, obesity, who presented to the hospital with abdominal pain.  She said abdominal pain has been going on intermittently for about 3 to 4 weeks.  She has also been having low back pain for about a couple of weeks that has slowly worsened.  There was also report of diarrhea.  Patient however said stools were not watery but reportedly she had bloodstained stool about 3 days prior to admission.  Stools were mostly greenish in color.  There was concern of C. difficile infection and apparently tested positive so she was started on oral vancomycin.   Acute diverticulitis:Was on IV  ceftriaxone and metronidazole.   --completed  Augmentin   --Analgesics as needed for pain. --Gastric fold thickening on CT suspect  Gastritis: Continue Protonix.   C. difficile infection: No diarrhea so stool for C. difficile testing could not be done in the hospital.  -- Patient had a positive C. difficile toxin test on 12/22/2022.  Copy of report was faxed to the unit. Stool for occult blood, lactoferrin and C.diff molecular (toxin B) were positive.  Reportedly, patient was started on oral vancomycin prior to coming to the hospital. -- Completed treatment with oral vancomycin  Acute UTI ruled out: Urine culture showed multiple species.    L1 compression fracture: Analgesics as needed for pain.  Patient declined L TSO brace.  She has been evaluated by neurosurgeon Dr Katrinka Blazing and no surgical intervention required.   --PT and OT evaluation--recs rehab    Chronic respiratory failure /chronic COPD/chornic oxygen Chronic diastolic CHF: Compensated.   --cont present meds -pt to use home CPAP--pt does not like to use much --seen by Dr Aleskerov--recommends APAP  Chronic Anemia/IDA --hgb 8.3 --follows with hematology  --s/p  Venofer 200 mg x1 with pre-med solumederol per Consuello Masse  Chronic debility/morbid obesity   TOC for d/c planning. Seen by Palliative care  Family communication :[name] Consults :Neurosurgery CODE STATUS: DNR DVT Prophylaxis :lovenox  declined by pt and dter--order discontinued Level of care: Med-Surg Status is: Inpatient TOC for d/c planning.    TOTAL TIME TAKING CARE OF THIS  PATIENT: 35 minutes.  >50% time spent on counselling and coordination of care  Note: This dictation was prepared with Dragon dictation along with smaller phrase technology. Any transcriptional errors that result from this process are unintentional.  Enedina Finner M.D    [facility]   CC: Primary care physician; Joaquim Nam, MD

## 2023-01-05 NOTE — TOC Progression Note (Signed)
Transition of Care [facility]) - Progression Note    Patient Details  Name: Alexandria [name] MRN: [id number] Date of Birth: 06-19-1935  Transition of Care [facility]) CM/SW Contact  Marlowe Sax, RN Phone Number: 01/05/2023, 4:31 PM  Clinical Narrative:    Received a call from Sarah and [facility] will be here tomorrow at 1030 to assess and see if they can accept her, her daughter will meet them here   Expected Discharge Plan: Skilled Nursing Facility Barriers to Discharge: SNF Pending bed offer  Expected Discharge Plan and Services   Discharge Planning Services: CM Consult   Living arrangements for the past 2 months: Assisted Living Facility                   DME Agency: NA       HH Arranged: NA           Social Determinants of Health (SDOH) Interventions SDOH Screenings   Food Insecurity: No Food Insecurity (12/24/2022)  Housing: Low Risk  (12/24/2022)  Transportation Needs: No Transportation Needs (12/24/2022)  Utilities: Not At Risk (12/24/2022)  Alcohol Screen: Low Risk  (10/21/2022)  Depression (PHQ2-9): Low Risk  (10/21/2022)  Recent Concern: Depression (PHQ2-9) - Medium Risk (08/03/2022)  Financial Resource Strain: Low Risk  (10/21/2022)  Physical Activity: Insufficiently Active (10/21/2022)  Social Connections: Socially Isolated (10/21/2022)  Stress: No Stress Concern Present (10/21/2022)  Tobacco Use: Medium Risk (12/30/2022)  Health Literacy: Adequate Health Literacy (10/21/2022)    Readmission Risk Interventions    12/28/2022    3:01 PM 11/18/2022    4:47 PM  Readmission Risk Prevention Plan  Transportation Screening Complete Complete  Medication Review (RN Care Manager) Complete Complete  PCP or Specialist appointment within 3-5 days of discharge Complete   HRI or Home Care Consult Complete Complete  SW Recovery Care/Counseling Consult Complete Complete  Palliative Care Screening Not Applicable Not Applicable  Skilled Nursing Facility  Complete Complete

## 2023-01-05 NOTE — TOC Progression Note (Signed)
Transition of Care [facility]) - Progression Note    Patient Details  Name: Alexandria [name] MRN: [id number] Date of Birth: 01-Feb-1936  Transition of Care [facility]) CM/SW Contact  Marlowe Sax, RN Phone Number: 01/05/2023, 1:54 PM  Clinical Narrative:    Spoke with the patient's daughter tanya and reviewed the bed offers in [place] [place] She reported that [facility] is awaiting clinical documents and they will come to assess the patient I called for Asher Muir at [facility] and was told she was off today, I sent the referral thru the hub to [facility], I am expecting a call back from [facility] about the assessment, In the meantime her daughter will review the bed offers   Expected Discharge Plan: Skilled Nursing Facility Barriers to Discharge: SNF Pending bed offer  Expected Discharge Plan and Services   Discharge Planning Services: CM Consult   Living arrangements for the past 2 months: Assisted Living Facility                   DME Agency: NA       HH Arranged: NA           Social Determinants of Health (SDOH) Interventions SDOH Screenings   Food Insecurity: No Food Insecurity (12/24/2022)  Housing: Low Risk  (12/24/2022)  Transportation Needs: No Transportation Needs (12/24/2022)  Utilities: Not At Risk (12/24/2022)  Alcohol Screen: Low Risk  (10/21/2022)  Depression (PHQ2-9): Low Risk  (10/21/2022)  Recent Concern: Depression (PHQ2-9) - Medium Risk (08/03/2022)  Financial Resource Strain: Low Risk  (10/21/2022)  Physical Activity: Insufficiently Active (10/21/2022)  Social Connections: Socially Isolated (10/21/2022)  Stress: No Stress Concern Present (10/21/2022)  Tobacco Use: Medium Risk (12/30/2022)  Health Literacy: Adequate Health Literacy (10/21/2022)    Readmission Risk Interventions    12/28/2022    3:01 PM 11/18/2022    4:47 PM  Readmission Risk Prevention Plan  Transportation Screening Complete Complete  Medication Review (RN Care Manager) Complete  Complete  PCP or Specialist appointment within 3-5 days of discharge Complete   HRI or Home Care Consult Complete Complete  SW Recovery Care/Counseling Consult Complete Complete  Palliative Care Screening Not Applicable Not Applicable  Skilled Nursing Facility Complete Complete

## 2023-01-05 NOTE — Progress Notes (Signed)
[facility] Liaison team will follow peripherally while awaiting a discharge disposition.       Please don't hesitate to call with any Hospice related questions or concerns.    Thank you for the opportunity to participate in this patient's care. [facility] Liaison [phone]

## 2023-01-06 DIAGNOSIS — Z7189 Other specified counseling: Secondary | ICD-10-CM | POA: Diagnosis not present

## 2023-01-06 DIAGNOSIS — I5032 Chronic diastolic (congestive) heart failure: Secondary | ICD-10-CM

## 2023-01-06 DIAGNOSIS — K5792 Diverticulitis of intestine, part unspecified, without perforation or abscess without bleeding: Secondary | ICD-10-CM | POA: Diagnosis not present

## 2023-01-06 DIAGNOSIS — S32010A Wedge compression fracture of first lumbar vertebra, initial encounter for closed fracture: Secondary | ICD-10-CM | POA: Diagnosis not present

## 2023-01-06 DIAGNOSIS — I480 Paroxysmal atrial fibrillation: Secondary | ICD-10-CM | POA: Diagnosis not present

## 2023-01-06 DIAGNOSIS — J449 Chronic obstructive pulmonary disease, unspecified: Secondary | ICD-10-CM | POA: Diagnosis not present

## 2023-01-06 NOTE — Progress Notes (Signed)
Patient refused for this RN to take purewick out. Educated patient upon importance of mobility and need for purewick to come out, patient still refused. Dr. Allena Katz aware.

## 2023-01-06 NOTE — Progress Notes (Signed)
[facility] Liaison team will follow peripherally while awaiting a discharge disposition.       Please don't hesitate to call with any Hospice related questions or concerns.    Thank you for the opportunity to participate in this patient's care. [facility] Liaison [phone]

## 2023-01-06 NOTE — Plan of Care (Signed)
Pt all night stated she cannot breathe oxygen levels were in the 60s on 4L. Bumped oxygen up to 6L patient sitting steadily between 75-85. Patient stated it hurts to breathe, pt still refuses cpap at night and refuses pain medicine because it is against her religion.    Problem: Health Behavior/Discharge Planning: Goal: Ability to manage health-related needs will improve Outcome: Not Progressing   Problem: Clinical Measurements: Goal: Respiratory complications will improve Outcome: Not Progressing   Problem: Activity: Goal: Risk for activity intolerance will decrease Outcome: Not Progressing   Problem: Education: Goal: Knowledge of General Education information will improve Description: Including pain rating scale, medication(s)/side effects and non-pharmacologic comfort measures Outcome: Progressing   Problem: Coping: Goal: Level of anxiety will decrease Outcome: Progressing

## 2023-01-06 NOTE — Progress Notes (Signed)
PT Cancellation Note  Patient Details Name: Alexandria [name] MRN: [id number] DOB: 1935/07/15   Cancelled Treatment:     Therapist in to check on pt this am. Pt and daughter requested discontinuing skilled PT/OT services at this time due to multiple co-morbidities and plans to transition to hospice. Will plan to sign off at this time. Secure chat sent to pt's multi-disciplinary care team.    Jannet Askew 01/06/2023, 11:45 AM

## 2023-01-06 NOTE — Progress Notes (Signed)
PULMONOLOGY         Date: 01/06/2023,   MRN# [id number] Alexandria [name] [birth year]     AdmissionWeight: 99.3 kg                 CurrentWeight: 99.3 kg  Referring provider: Dr Alexandria [name]   CHIEF COMPLAINT:   Acute on chronic hypoxemic respiratory failure   HISTORY OF PRESENT ILLNESS    Patient went to pulmonologist and went to have APAP mask changed but was unable to get correct interface.  She is not able to tolerate current mask interface.  Alexandria [name] her daughter shares that CO2 levels have been increased and causes her to have encephalopathy.  She also notes claustrophobia with full face mask.   01/01/23- Patient examined at baseline.  She was sitting up today but still with confusion and episodes of encephalopathy.  Patient does not wear cpap mask as instructed and this is partially due to encephalopathy but also due to comorbid advanced age status. We had a conference together with her POA daugther Alexandria [name] and in presence of attending physician Dr Alexandria [name].  We agreed that we do not wish to force patient to do things and do not recommend aggressive therapy due to advanced age comorbid status. Patient and family agree to goals of care discussion with Palliative care consultant.  We will not escalate therapy at this time.   01/06/23- patient remains with poor long term prognosis, she is not able to tolerate CPAP/BIPAP interface. I met with Alexandria [name] today and have explained that her mother is at end of life and she may pass away.   Patient and family are working on goals of care with palliative care service.  I recommend DNR/DNI and consideration for hospice due to recurrent hospitalizations with advanced Emphysema/COPD and chronic hypercapnic hypoxemic respiratory failure.     PAST MEDICAL HISTORY   Past Medical History:  Diagnosis Date   Allergy, unspecified not elsewhere classified    Anxiety    Aortic stenosis, mild 03/30/2022   a. TTE 03/29/2022: EF 55-60, no RWMA, GR 1 DD,  normal RVSF, mild aortic stenosis (mean 10, V-max 212 cm/s, DI 0.70)   Cataract    Chronic diastolic congestive heart failure (HCC)    a. 06/2022 Echo: EF 60-65%, no rwma, nl RV fxn, mild MR, mild AS.   COPD (chronic obstructive pulmonary disease) (HCC) 03/08/1998   PFTs 12/12/2002 FEV 1 1.42 (64%) ratio 58 with no better after B2 and DLCO75%; PFTs 11/20/09 FEV1 1.50 (73%) ratio 50 no better after B2 with DLCO 62%; Hfa 50% 11/20/2009 >75%, 01/13/10 p coaching   Coronary artery calcification seen on CT scan    a. 2012 Neg stress test.   Depression 12/07/1998   Diabetes mellitus type II 02/05/2006   Dr. Elvera Lennox with endo   Disorders of bursae and tendons in shoulder region, unspecified    Rotator cuff syndrome, right   E. coli bacteremia    Esophagitis    GERD (gastroesophageal reflux disease)    History of UTI    Hyperlipidemia 10/04/2000   Hypertension 03/08/1992   Hypothyroidism 03/08/1968   Iron deficiency anemia    Lung nodule    radiation starts 10-08-2019   Malignant neoplasm of right upper lobe of lung (HCC) 07/24/2019   Obesity    NOS   OSA (obstructive sleep apnea)    PSG 01/27/10 AHI 13, pt does not know CPAP settings   PAF (paroxysmal atrial fibrillation) (HCC)    a.  06/2022 in setting of UTI/encephalopathy-->converted on amio; b. CHA2DS2VASc = 6-->No OAC 2/2 chronic anemia/fall risk; c. 06/2022 Zio: Predominantly sinus bradycardia at 54 (3-158)'s.  1 brief run of SVT.  2.2% PVC burden.   Peripheral neuropathy    Likely due to DM per Dr. Sandria Manly   Renal lesion 12/22/2022   Ventral hernia      SURGICAL HISTORY   Past Surgical History:  Procedure Laterality Date   ABD U/S  03/19/1999   Nml x2 foci in liver   ADENOSINE MYOVIEW  06/02/2007   Nml   CARDIOLITE PERSANTINE  08/24/2000   Nml   CAROTID U/S  08/24/2000   1-39% ICA stenosis   CAROTID U/S  06/02/2007   No apprec change    CARPAL TUNNEL RELEASE  12/1997   Right   CESAREAN SECTION     x2 Breech/ repeat    CHOLECYSTECTOMY  1997   COLONOSCOPY WITH PROPOFOL N/A 02/12/2016   Procedure: COLONOSCOPY WITH PROPOFOL;  Surgeon: Rachael Fee, MD;  Location: [facility];  Service: Endoscopy;  Laterality: N/A;   CT ABD W & PELVIS WO/W CM     Abd hemangiomas of liver, 1 cm R renal cyst   DENTAL SURGERY  2016   Implants   DEXA  07/03/2003   Nml   ESOPHAGOGASTRODUODENOSCOPY  12/05/1997   Nml (due to hoarseness)   ESOPHAGOGASTRODUODENOSCOPY (EGD) WITH PROPOFOL N/A 02/12/2016   Procedure: ESOPHAGOGASTRODUODENOSCOPY (EGD) WITH PROPOFOL;  Surgeon: Rachael Fee, MD;  Location: [facility];  Service: Endoscopy;  Laterality: N/A;   GALLBLADDER SURGERY     HERNIA REPAIR  01/24/2009   Lap Ventr w/ Lysis of adhesions (Dr. Dwain Sarna)   knee arthroscopic surgery  years ago   right   ROTATOR CUFF REPAIR  1984   Right, [place]   SHOULDER OPEN ROTATOR CUFF REPAIR  02/08/2012   Procedure: ROTATOR CUFF REPAIR SHOULDER OPEN;  Surgeon: Drucilla Schmidt, MD;  Location: [facility];  Service: Orthopedics;  Laterality: Left;  Left Shoulder Open Anterior Acrominectomy Rotator Cuff Repair Open Distal Clavicle Resection ,tissue mend graft, and repair of biceps tendon   THUMB RELEASE  12/1997   Right   TONSILLECTOMY     TOTAL ABDOMINAL HYSTERECTOMY  1985   Due to dysmennorhea   US ECHOCARDIOGRAPHY  06/02/2007     FAMILY HISTORY   Family History  Problem Relation Age of Onset   Heart disease Mother    Thyroid disease Mother    Emphysema Father        One lung   Cystic fibrosis Sister    Hyperthyroidism Sister    Osteoarthritis Brother    Hyperthyroidism Brother    Esophageal cancer Neg Hx    Cancer Neg Hx        Head or neck   Colon cancer Neg Hx    Stomach cancer Neg Hx    Breast cancer Neg Hx      SOCIAL HISTORY   Social History   Tobacco Use   Smoking status: Former    Current packs/day: 0.00    Average packs/day: 2.0 packs/day for 50.0 years (100.0 ttl pk-yrs)    Types: Cigarettes    Start  date: 02/06/1955    Quit date: 02/05/2005    Years since quitting: 17.9   Smokeless tobacco: Never  Vaping Use   Vaping status: Never Used  Substance Use Topics   Alcohol use: Yes    Comment: occasionally   Drug use: Never  MEDICATIONS    Home Medication:     Current Medication:  Current Facility-Administered Medications:    acetaminophen (TYLENOL) tablet 650 mg, 650 mg, Oral, TID, Enedina Finner, MD, 650 mg at 01/06/23 0938   albuterol (PROVENTIL) (2.5 MG/3ML) 0.083% nebulizer solution 3 mL, 3 mL, Inhalation, Q6H PRN, Mansy, Jan A, MD, 3 mL at 01/01/23 1814   alum & mag hydroxide-simeth (MAALOX/MYLANTA) 200-200-20 MG/5ML suspension 15 mL, 15 mL, Oral, Q6H PRN, Lurene Shadow, MD, 15 mL at 01/03/23 1301   amiodarone (PACERONE) tablet 200 mg, 200 mg, Oral, Daily, Mansy, Jan A, MD, 200 mg at 01/06/23 0942   arformoterol (BROVANA) nebulizer solution 15 mcg, 15 mcg, Nebulization, QHS, Mansy, Jan A, MD, 15 mcg at 01/05/23 1940   budesonide (PULMICORT) nebulizer solution 0.5 mg, 0.5 mg, Nebulization, BID, Mansy, Jan A, MD, 0.5 mg at 01/06/23 0946   busPIRone (BUSPAR) tablet 15 mg, 15 mg, Oral, TID, Mansy, Jan A, MD, 15 mg at 01/06/23 7829   cholecalciferol (VITAMIN D3) 25 MCG (1000 UNIT) tablet 2,000 Units, 2,000 Units, Oral, Daily, Mansy, Jan A, MD, 2,000 Units at 01/06/23 5621   ezetimibe (ZETIA) tablet 10 mg, 10 mg, Oral, QHS, Mansy, Jan A, MD, 10 mg at 01/05/23 2149   ferrous sulfate tablet 325 mg, 325 mg, Oral, Daily, Mansy, Jan A, MD, 325 mg at 01/06/23 3086   FLUoxetine (PROZAC) capsule 20 mg, 20 mg, Oral, BID, Mansy, Jan A, MD, 20 mg at 01/06/23 5784   fluticasone (FLONASE) 50 MCG/ACT nasal spray 2 spray, 2 spray, Each Nare, Daily, Mansy, Jan A, MD, 2 spray at 01/06/23 0945   guaiFENesin (MUCINEX) 12 hr tablet 1,200 mg, 1,200 mg, Oral, BID PRN, Mansy, Jan A, MD, 1,200 mg at 01/03/23 6962   levothyroxine (SYNTHROID) tablet 125 mcg, 125 mcg, Oral, Q0600, Mansy, Jan A, MD, 125 mcg at  01/06/23 0517   lidocaine (LIDODERM) 5 % 1 patch, 1 patch, Transdermal, Q24H, Mansy, Jan A, MD, 1 patch at 01/06/23 0936   loratadine (CLARITIN) tablet 10 mg, 10 mg, Oral, Daily PRN, Mansy, Jan A, MD   LORazepam (ATIVAN) tablet 0.5 mg, 0.5 mg, Oral, Q4H PRN, Mansy, Jan A, MD, 0.5 mg at 01/05/23 2031   magnesium hydroxide (MILK OF MAGNESIA) suspension 30 mL, 30 mL, Oral, Daily PRN, Mansy, Jan A, MD   nitroGLYCERIN (NITROSTAT) SL tablet 0.4 mg, 0.4 mg, Sublingual, Q5 min PRN, Mansy, Jan A, MD   ondansetron (ZOFRAN) tablet 4 mg, 4 mg, Oral, Q6H PRN **OR** ondansetron (ZOFRAN) injection 4 mg, 4 mg, Intravenous, Q6H PRN, Mansy, Jan A, MD   pantoprazole (PROTONIX) EC tablet 40 mg, 40 mg, Oral, Daily, Mansy, Jan A, MD, 40 mg at 01/06/23 0939   potassium chloride (KLOR-CON M) CR tablet 10 mEq, 10 mEq, Oral, Daily, Mansy, Jan A, MD, 10 mEq at 01/06/23 9528   revefenacin (YUPELRI) nebulizer solution 175 mcg, 175 mcg, Nebulization, Daily, Mansy, Jan A, MD, 175 mcg at 01/06/23 0943   rosuvastatin (CRESTOR) tablet 10 mg, 10 mg, Oral, QODAY, Mansy, Jan A, MD, 10 mg at 01/06/23 4132   sodium chloride flush (NS) 0.9 % injection 10 mL, 10 mL, Intravenous, Q12H, Mumma, Shannon, MD, 10 mL at 01/05/23 2151   torsemide (DEMADEX) tablet 20 mg, 20 mg, Oral, Daily PRN, Otelia Sergeant, RPH   traZODone (DESYREL) tablet 25 mg, 25 mg, Oral, QHS PRN, Mansy, Jan A, MD, 25 mg at 01/05/23 2148    ALLERGIES   Antihistamines, diphenhydramine-type; Incruse ellipta [umeclidinium bromide];  Clarithromycin; Codeine; Diphenhydramine; Pregabalin; Sulfonamide derivatives; Tiotropium bromide monohydrate; Umeclidinium; and Vraylar [cariprazine]     REVIEW OF SYSTEMS    Review of Systems:  Gen:  Denies  fever, sweats, chills weigh loss  HEENT: Denies blurred vision, double vision, ear pain, eye pain, hearing loss, nose bleeds, sore throat Cardiac:  No dizziness, chest pain or heaviness, chest tightness,edema Resp:   reports  dyspnea chronically  Gi: Denies swallowing difficulty, stomach pain, nausea or vomiting, diarrhea, constipation, bowel incontinence Gu:  Denies bladder incontinence, burning urine Ext:   Denies Joint pain, stiffness or swelling Skin: Denies  skin rash, easy bruising or bleeding or hives Endoc:  Denies polyuria, polydipsia , polyphagia or weight change Psych:   Denies depression, insomnia or hallucinations   Other:  All other systems negative   VS: BP (!) 137/43 (BP Location: Left Arm)   Pulse 67   Temp 98.1 F (36.7 C) (Oral)   Resp 14   Ht 5\' 5"  (1.651 m)   Wt 99.3 kg   LMP  (LMP Unknown)   SpO2 (!) 82%   BMI 36.44 kg/m      PHYSICAL EXAM    GENERAL:NAD, no fevers, chills, no weakness no fatigue HEAD: Normocephalic, atraumatic.  EYES: Pupils equal, round, reactive to light. Extraocular muscles intact. No scleral icterus.  MOUTH: Moist mucosal membrane. Dentition intact. No abscess noted.  EAR, NOSE, THROAT: Clear without exudates. No external lesions.  NECK: Supple. No thyromegaly. No nodules. No JVD.  PULMONARY: decreased breath sounds with mild rhonchi worse at bases bilaterally.  CARDIOVASCULAR: S1 and S2. Regular rate and rhythm. No murmurs, rubs, or gallops. No edema. Pedal pulses 2+ bilaterally.  GASTROINTESTINAL: Soft, nontender, nondistended. No masses. Positive bowel sounds. No hepatosplenomegaly.  MUSCULOSKELETAL: No swelling, clubbing, or edema. Range of motion full in all extremities.  NEUROLOGIC: Cranial nerves II through XII are intact. No gross focal neurological deficits. Sensation intact. Reflexes intact.  SKIN: No ulceration, lesions, rashes, or cyanosis. Skin warm and dry. Turgor intact.  PSYCHIATRIC: Mood, affect within normal limits. The patient is awake, alert and oriented x 3. Insight, judgment intact.       IMAGING   @IMAGES @   ASSESSMENT/PLAN     Advanced copd with chronic hypoxemia and hypercarbia  -Agree with APAP and will obtain  appropriate mask  -patient struggles to use this device , we agreed on palliative eval -recommendation for hospice  Acute UTI   As per [facility] , currently on abx      Thank you for allowing me to participate in the care of this patient.   Patient/Family are satisfied with care plan and all questions have been answered.    Provider disclosure: Patient with at least one acute or chronic illness or injury that poses a threat to life or bodily function and is being managed actively during this encounter.  All of the below services have been performed independently by signing provider:  review of prior documentation from internal and or external health records.  Review of previous and current lab results.  Interview and comprehensive assessment during patient visit today. Review of current and previous chest radiographs/CT scans. Discussion of management and test interpretation with health care team and patient/family.   This document was prepared using Dragon voice recognition software and may include unintentional dictation errors.     Vida Rigger, M.D.  Division of Pulmonary & Critical Care Medicine

## 2023-01-06 NOTE — TOC Progression Note (Signed)
Transition of Care [facility]) - Progression Note    Patient Details  Name: Alexandria [name] MRN: [id number] Date of Birth: [birth year]  Transition [facility] CM/SW Contact  Hetty Ely, RN Phone Number: 01/06/2023, 2:17 PM  Clinical Narrative:  [facility], RN did an admission assessment and concluded that patient did not meet requirements for their facility and would need SNF . MD and daughter aware, daughter is visiting 3 facilities in [place] today and hope to make a decision soon.    Expected Discharge Plan: Skilled Nursing Facility Barriers to Discharge: SNF Pending bed offer  Expected Discharge Plan and Services   Discharge Planning Services: CM Consult   Living arrangements for the past 2 months: Assisted Living Facility                   DME Agency: NA       HH Arranged: NA           Social Determinants of Health (SDOH) Interventions SDOH Screenings   Food Insecurity: No Food Insecurity (12/24/2022)  Housing: Low Risk  (12/24/2022)  Transportation Needs: No Transportation Needs (12/24/2022)  Utilities: Not At Risk (12/24/2022)  Alcohol Screen: Low Risk  (10/21/2022)  Depression (PHQ2-9): Low Risk  (10/21/2022)  Recent Concern: Depression (PHQ2-9) - Medium Risk (08/03/2022)  Financial Resource Strain: Low Risk  (10/21/2022)  Physical Activity: Insufficiently Active (10/21/2022)  Social Connections: Socially Isolated (10/21/2022)  Stress: No Stress Concern Present (10/21/2022)  Tobacco Use: Medium Risk (12/30/2022)  Health Literacy: Adequate Health Literacy (10/21/2022)    Readmission Risk Interventions    12/28/2022    3:01 PM 11/18/2022    4:47 PM  Readmission Risk Prevention Plan  Transportation Screening Complete Complete  Medication Review (RN Care Manager) Complete Complete  PCP or Specialist appointment within 3-5 days of discharge Complete   HRI or Home Care Consult Complete Complete  SW Recovery Care/Counseling Consult Complete Complete   Palliative Care Screening Not Applicable Not Applicable  Skilled Nursing Facility Complete Complete

## 2023-01-06 NOTE — Progress Notes (Signed)
OT Cancellation Note  Patient Details Name: Alexandria [name] MRN: [id number] DOB: 1935/03/14   Cancelled Treatment:    Reason Eval/Treat Not Completed: Other (comment). Pt and daughter requested discontinuing skilled PT/OT services at this time due to multiple co-morbidities and plans to transition to hospice. OT to complete orders at this time.   Jackquline Denmark, MS, OTR/L , CBIS ascom [phone]  01/06/23, 11:56 AM

## 2023-01-06 NOTE — Plan of Care (Signed)
  Problem: Clinical Measurements: Goal: Will remain free from infection 01/06/2023 1600 by Jacki Cones, RN Outcome: Progressing 01/06/2023 1559 by Jacki Cones, RN Outcome: Progressing 01/06/2023 1559 by Jacki Cones, RN Outcome: Progressing Goal: Cardiovascular complication will be avoided 01/06/2023 1600 by Jacki Cones, RN Outcome: Progressing 01/06/2023 1559 by Jacki Cones, RN Outcome: Progressing 01/06/2023 1559 by Jacki Cones, RN Outcome: Progressing   Problem: Nutrition: Goal: Adequate nutrition will be maintained 01/06/2023 1600 by Jacki Cones, RN Outcome: Progressing 01/06/2023 1559 by Jacki Cones, RN Outcome: Progressing 01/06/2023 1559 by Jacki Cones, RN Outcome: Progressing   Problem: Coping: Goal: Level of anxiety will decrease 01/06/2023 1600 by Jacki Cones, RN Outcome: Progressing 01/06/2023 1559 by Jacki Cones, RN Outcome: Progressing 01/06/2023 1559 by Jacki Cones, RN Outcome: Progressing   Problem: Elimination: Goal: Will not experience complications related to bowel motility 01/06/2023 1600 by Jacki Cones, RN Outcome: Progressing 01/06/2023 1559 by Jacki Cones, RN Outcome: Progressing 01/06/2023 1559 by Jacki Cones, RN Outcome: Progressing Goal: Will not experience complications related to urinary retention 01/06/2023 1600 by Jacki Cones, RN Outcome: Progressing 01/06/2023 1559 by Jacki Cones, RN Outcome: Progressing 01/06/2023 1559 by Jacki Cones, RN Outcome: Progressing   Problem: Pain Managment: Goal: General experience of comfort will improve 01/06/2023 1600 by Jacki Cones, RN Outcome: Progressing 01/06/2023 1559 by Jacki Cones, RN Outcome: Progressing 01/06/2023 1559 by Jacki Cones, RN Outcome: Progressing   Problem: Safety: Goal: Ability to remain free from injury will  improve 01/06/2023 1600 by Jacki Cones, RN Outcome: Progressing 01/06/2023 1559 by Jacki Cones, RN Outcome: Progressing 01/06/2023 1559 by Jacki Cones, RN Outcome: Progressing   Problem: Skin Integrity: Goal: Risk for impaired skin integrity will decrease 01/06/2023 1600 by Jacki Cones, RN Outcome: Progressing 01/06/2023 1559 by Jacki Cones, RN Outcome: Progressing 01/06/2023 1559 by Jacki Cones, RN Outcome: Progressing

## 2023-01-06 NOTE — Progress Notes (Addendum)
Daily Progress Note   Patient Name: Alexandria [name]       Date: 01/06/2023 DOB: 11/23/1935  Age: 87 y.o. MRN#: [id number] Attending Physician: Enedina Finner, MD Primary Care Physician: Joaquim Nam, MD Admit Date: 09-Jan-2023  Reason for Consultation/Follow-up: Establishing goals of care  Subjective: Call received from primary team regarding determining goals of care.  Discussed case in depth.  Notes and labs reviewed.  Into see patient.  Patient is sitting in bed with eyes closed.  She is confused and agitated requesting for her coat to be removed.  As patient is not wearing a coat, attempted to remove blanket and patient stated she wanted to keep it.  She does not speak further.  She is tachypneic to a rate of 30's with some accessory muscle use. Patient's sister is at bedside.  Sister discusses that she hopes patient will receive medications to be comfortable for what time she has left on this earth; she states she does not feel she is comfortable at this time.  She states their mother was under hospice care with medications for comfort prior to her death.  Sister and I called patient's daughter [name].  Discussed patient's current status.  Discussed patient's desaturations. [name] advises that she does not trust "the process" and does not want any medications provided for patient's comfort until she authorizes it.  She states she does not want to hasten her mother's death.  Discussed the purpose of comfort care and symptom management.  Discussed the goal of treating symptoms as needed, while keeping a person as awake, alert, and able to spend time with family as possible.    We discussed quality of life and suffering.  She states her mother has lived for years with shortness of breath and she does  not consider her to be suffering.  Attempted to explore her thoughts and views on suffering and she stated she was unable to answer this and would need to speak with her mother.  She states she wants her mother to go to a facility and she has been touring facilities.  Called attending back to update.  Length of Stay: 13  Current Medications: Scheduled Meds:   acetaminophen  650 mg Oral TID   amiodarone  200 mg Oral Daily   arformoterol  15 mcg Nebulization QHS  budesonide  0.5 mg Nebulization BID   busPIRone  15 mg Oral TID   cholecalciferol  2,000 Units Oral Daily   ezetimibe  10 mg Oral QHS   ferrous sulfate  325 mg Oral Daily   FLUoxetine  20 mg Oral BID   fluticasone  2 spray Each Nare Daily   levothyroxine  125 mcg Oral Q0600   lidocaine  1 patch Transdermal Q24H   pantoprazole  40 mg Oral Daily   potassium chloride  10 mEq Oral Daily   revefenacin  175 mcg Nebulization Daily   rosuvastatin  10 mg Oral QODAY   sodium chloride flush  10 mL Intravenous Q12H    Continuous Infusions:   PRN Meds: albuterol, alum & mag hydroxide-simeth, guaiFENesin, loratadine, LORazepam, magnesium hydroxide, nitroGLYCERIN, ondansetron **OR** ondansetron (ZOFRAN) IV, torsemide, traZODone  Physical Exam Constitutional:      Comments: Eyes closed.  Patient appears agitated.   Pulmonary:     Comments: Tachypnea and work of breathing noted.            Vital Signs: BP (!) 109/34 (BP Location: Right Arm)   Pulse 67   Temp 98 F (36.7 C)   Resp 14   Ht 5\' 5"  (1.651 m)   Wt 99.3 kg   LMP  (LMP Unknown)   SpO2 (!) 84%   BMI 36.44 kg/m  SpO2: SpO2: (!) 84 % O2 Device: O2 Device: Nasal Cannula O2 Flow Rate: O2 Flow Rate (L/min): 6 L/min  Intake/output summary:  Intake/Output Summary (Last 24 hours) at 01/06/2023 1654 Last data filed at 01/06/2023 1100 Gross per 24 hour  Intake 480 ml  Output 200 ml  Net 280 ml   LBM: Last BM Date : 01/04/23 Baseline Weight: Weight: 99.3 kg Most  recent weight: Weight: 99.3 kg        Patient Active Problem List   Diagnosis Date Noted   C. difficile colitis 12/25/2022   Acute diverticulitis 12/10/2022   Closed compression fracture of body of L1 vertebra (HCC) 12/12/2022   Coronary artery disease 12/31/2022   GERD without esophagitis 01/03/2023   Dyslipidemia 12/29/2022   Anxiety and depression 12/27/2022   Back pain 12/22/2022   Renal lesion 12/22/2022   Acute respiratory disease due to COVID-19 virus 11/29/2022   Acute on chronic respiratory failure with hypercapnia (HCC) 11/28/2022   COPD (chronic obstructive pulmonary disease) (HCC) 11/28/2022   Depression with anxiety 11/28/2022   Obesity (BMI 30-39.9) 11/28/2022   Hypernatremia 11/23/2022   Confusion 11/21/2022   Constipation 11/21/2022   Symptomatic anemia 11/17/2022   Acute on chronic diastolic CHF (congestive heart failure) (HCC) 11/17/2022   COPD with acute exacerbation (HCC) 11/17/2022   Goals of care, counseling/discussion 10/05/2022   Stage 3b chronic kidney disease (HCC) 10/05/2022   Neurogenic orthostatic hypotension (HCC) 07/13/2022   Non-pressure chronic ulcer of other part of right lower leg with other specified severity (HCC) 07/13/2022   Atherosclerotic heart disease of native coronary artery with other forms of angina pectoris (HCC) 07/13/2022   Chronic diastolic (congestive) heart failure (HCC) 07/07/2022   Paroxysmal atrial fibrillation (HCC) 07/02/2022   Acute on chronic respiratory failure with hypoxia and hypercapnia (HCC) 06/29/2022   Acute encephalopathy 06/29/2022   Aortic stenosis, mild 03/30/2022   PVC's (premature ventricular contractions) 02/23/2022   Skin lesion 08/12/2021   Leg swelling 06/08/2021   Grief 05/27/2021   H/O laceration of skin 05/27/2021   PND (post-nasal drip) 04/27/2021   Wheezing 04/27/2021   Acute cough  04/27/2021   Physical deconditioning 01/30/2020   Healthcare maintenance 01/30/2020   PAD (peripheral artery  disease) (HCC) 01/27/2020   Sore throat 11/21/2019   GERD (gastroesophageal reflux disease) 11/21/2019   CAD (coronary artery disease) 07/24/2019   Malignant neoplasm of right upper lobe of lung (HCC) 07/24/2019   Vitamin D deficiency 06/29/2018   Hyperkalemia 06/28/2018   Dysuria 01/01/2018   Abnormal findings on diagnostic imaging of lung 12/18/2017   Rhinitis 12/18/2017   Knee laceration, right, subsequent encounter 10/06/2016   Knee pain, acute 10/06/2016   Chronic pain of left knee 09/15/2016   Right foot pain 09/15/2016   Chronic respiratory failure with hypoxia (HCC) 07/14/2016   Diverticulosis of colon without hemorrhage    Hemorrhoids    Hiatal hernia    Type 2 diabetes mellitus with diabetic neuropathy, without long-term current use of insulin (HCC) 04/04/2015   Left temporal headache 03/14/2015   Vertigo 03/14/2015   Chest tightness 12/04/2014   Cystitis 11/03/2014   Acute on chronic heart failure with preserved ejection fraction (HFpEF) (HCC)    Iron deficiency anemia    OSA on CPAP    Obesity hypoventilation syndrome (HCC)    Chronic diastolic CHF (congestive heart failure) (HCC)    UTI (urinary tract infection)    Acute on chronic blood loss anemia    Medicare annual wellness visit, subsequent 06/14/2014   Advance care planning 06/14/2014   RLQ abdominal pain 05/10/2014   COPD exacerbation (HCC) 04/28/2014   Lower extremity edema 09/15/2012   DOE (dyspnea on exertion) 08/24/2012   Shoulder pain 12/17/2011   Abdominal hernia 12/17/2011   Anxiety 09/23/2011   Osteopenia 07/25/2011   Obstructive sleep apnea 02/09/2010   Murmur 02/04/2010   CHEST PAIN 01/13/2010   PALPITATIONS 08/29/2008   ESOPHAGEAL STENOSIS 11/22/2007   DYSPHAGIA 11/08/2007   ESOPHAGITIS 10/11/2007   Edema 07/03/2007   COPD with chronic bronchitis and emphysema (HCC) 05/23/2007   ALLERGY 07/25/2006   NUMBNESS 10/06/2001   Hyperlipemia 10/04/2000   Depression, recurrent (HCC) 03/08/1997    Essential hypertension 03/08/1992   Obesity, Class III, BMI 40-49.9 (morbid obesity) (HCC) 03/08/1989   ROTATOR CUFF SYNDROME, RIGHT 03/08/1982   Hypothyroidism 03/08/1968    Palliative Care Assessment & Plan    Recommendations/Plan:  Continue current care.  Spoke with attending team, and attending team will advise PMT if patient or family would like to speak about goals of care over the weekend.  PMT will plan to shadow next week.    Code Status:    Code Status Orders  (From admission, onward)           Start     Ordered   01/02/23 1304  Do not attempt resuscitation (DNR)- Limited -Do Not Intubate (DNI)  (Code Status)  Continuous       Question Answer Comment  If pulseless and not breathing No CPR or chest compressions.   In Pre-Arrest Conditions (Patient Is Breathing and Has A Pulse) Do not intubate. Provide all appropriate non-invasive medical interventions. Avoid ICU transfer unless indicated or required.   Consent: Discussion documented in EHR or advanced directives reviewed      01/02/23 1303           Code Status History     Date Active Date Inactive Code Status Order ID Comments User Context   01/04/2023 2100 01/02/2023 1303 Full Code [id number]  Hannah Beat, MD ED   11/28/2022 2227 12/02/2022 2122 Full Code [id number]  Lorretta Harp, MD  ED   11/16/2022 2152 11/26/2022 1941 Full Code [id number]  Andris Baumann, MD ED   06/29/2022 1852 07/07/2022 1941 Full Code [id number]  Verdene Lennert, MD ED   09/27/2014 1819 10/02/2014 1903 Full Code [id number]  Lupita Leash, MD Inpatient      Advance Directive Documentation    Flowsheet Row Most Recent Value  Type of Advance Directive Living will  Pre-existing out of facility DNR order (yellow form or pink MOST form) --  "MOST" Form in Place? --       Prognosis:  < 2 weeks   Thank you for allowing the Palliative Medicine Team to assist in the care of this patient.    Morton Stall, NP  Please contact  Palliative Medicine Team phone at [phone] for questions and concerns.

## 2023-01-06 NOTE — Progress Notes (Signed)
[facility]  - [name]   PATIENT NAME: Alexandria [name]    MR#:  [id number]  DATE OF BIRTH:  August 25, 1935  SUBJECTIVE:  patient laying in bed today. Dter and sister in the room patient appears very fatigued and gets very short winded before completing the sentence. Stepped out and talked with daughter Alexandria [name] at length discussing patients overall poor prognosis and if she wants to consider comfort care/hospice. Daughter is not able to make the decision.  Patient is refusing PT OT. Very deconditioned VITALS:  Blood pressure (!) 109/34, pulse 67, temperature 98 F (36.7 C), resp. rate 14, height 5\' 5"  (1.651 m), weight 99.3 kg, SpO2 (!) 84%.  PHYSICAL EXAMINATION:   GENERAL:  87 y.o.-year-old patient with no acute distress. Morbidly obese LUNGS: decreased breath sounds bilaterally CARDIOVASCULAR: S1, S2 normal.  ABDOMEN: Soft, diffuse tender, nondistended.  EXTREMITIES: +  edema b/l.   Left thumb skin tear + NEUROLOGIC: nonfocal  patient is alert and awake, weak  LABORATORY PANEL:  CBC Recent Labs  Lab 01/03/23 0429  WBC 4.9  HGB 8.2*  HCT 26.9*  PLT 188    Chemistries  Recent Labs  Lab 01/03/23 0429  NA 141  K 4.1  CL 105  CO2 32  GLUCOSE 125*  BUN 12  CREATININE 0.84  CALCIUM 8.3*  MG 2.3   Assessment and Plan  Alexandria [name] is a 87 y.o. female with medical history significant for anxiety, aortic stenosis, COPD, coronary artery disease, type 2 diabetes mellitus, atrial fibrillation, GERD, hypertension, dyslipidemia, hypothyroidism, OSA, CKD stage IIIb, peripheral neuropathy, obesity, who [facility] to [name] with [facility].  She said abdominal pain has been going on intermittently for about 3 to 4 weeks.  She has also been having low back pain for about a couple of weeks that has slowly worsened.  There was also report of diarrhea.  Patient however said stools were not watery but reportedly she had bloodstained stool about 3  days prior to admission.  Stools were mostly greenish in color.  There was concern of C. difficile infection and apparently tested positive so she was started on oral vancomycin.   Chronic respiratory failure /chronic COPD/chornic oxygen Chronic diastolic CHF: Compensated.   --cont present meds -pt to use home CPAP--pt does not like to use much --seen by Dr Aleskerov--recommends consideration of hospice since overall carries a very poor prognosis. Pt is refusing PT/OT. Very fatigued  Acute diverticulitis:Was on IV ceftriaxone and metronidazole.   --completed  Augmentin   --Analgesics as needed for pain. --Gastric fold thickening on CT suspect  Gastritis: Continue Protonix.   C. difficile infection: No diarrhea so stool for C. difficile testing could not be done in the [name].  -- Patient had a positive C. difficile toxin test on 12/22/2022.   Stool for occult blood, lactoferrin and C.diff molecular (toxin B) were positive.  Reportedly, patient was started on oral vancomycin prior to coming to the [name]. -- Completed treatment with oral vancomycin   L1 compression fracture: Analgesics as needed for pain.  Patient declined L TSO brace.  She has been evaluated by neurosurgeon Dr Katrinka Blazing and no surgical intervention required.   --PT and OT evaluation--recs rehab    Chronic Anemia/IDA --hgb 8.3 --follows with hematology  --s/p  Venofer 200 mg x1   Chronic debility/morbid obesity --poor prognosis   TOC for d/c planning. Seen by Palliative care  Family communication :Alexandria [name] Consults :Neurosurgery CODE STATUS: DNR DVT Prophylaxis :lovenox  declined by pt and dter--order discontinued Level of care: Med-Surg Status is: Inpatient TOC for d/c planning.    TOTAL TIME TAKING CARE OF THIS PATIENT: 35 minutes.  >50% time spent on counselling and coordination of care  Note: This dictation was prepared with Dragon dictation along with smaller phrase technology. Any transcriptional errors that  result from this process are unintentional.  Enedina Finner M.D    [facility]   CC: Primary care physician; Alexandria Nam, MD

## 2023-01-07 DIAGNOSIS — S32010A Wedge compression fracture of first lumbar vertebra, initial encounter for closed fracture: Secondary | ICD-10-CM | POA: Diagnosis not present

## 2023-01-07 DIAGNOSIS — I5032 Chronic diastolic (congestive) heart failure: Secondary | ICD-10-CM | POA: Diagnosis not present

## 2023-01-07 DIAGNOSIS — J449 Chronic obstructive pulmonary disease, unspecified: Secondary | ICD-10-CM | POA: Diagnosis not present

## 2023-01-07 DIAGNOSIS — I480 Paroxysmal atrial fibrillation: Secondary | ICD-10-CM | POA: Diagnosis not present

## 2023-01-07 MED ORDER — DEXTROSE IN LACTATED RINGERS 5 % IV SOLN: INTRAVENOUS | Status: DC

## 2023-01-07 DEATH — deceased

## 2023-01-10 ENCOUNTER — Telehealth: Payer: Self-pay | Admitting: Family Medicine

## 2023-01-10 NOTE — Telephone Encounter (Signed)
Patient daughter Alexandria [name] called in and wanted to speak with Dr. Para March in regards to the passing of her mom. She can be reached at [phone]. Thank you!

## 2023-01-12 NOTE — Telephone Encounter (Signed)
Late entry, talked with daughter today.  She thanked me for the care her mother got at [facility].  I thanked her for the call.

## 2023-01-19 ENCOUNTER — Ambulatory Visit: Payer: Medicare Other | Admitting: Neurosurgery

## 2023-01-24 ENCOUNTER — Ambulatory Visit: Payer: Medicare Other | Admitting: Neurosurgery

## 2023-01-25 ENCOUNTER — Encounter: Payer: Medicare Other | Admitting: Family

## 2023-02-06 NOTE — Progress Notes (Signed)
IV team consulted to obtain IV access for patient. Upon arrival to bedside, family states that they have changed their mind after seeking outside counsel from a priest and do not wish to start an IV. Primary RN aware.

## 2023-02-06 NOTE — Progress Notes (Signed)
[facility]  - [place] [facility]   PATIENT NAME: Alexandria [name]    MR#:  [id number]  DATE OF BIRTH:  07-Nov-1935  SUBJECTIVE:  patient laying in bed today.sister in the room. Patient has been short winded per staff. Has had a lot of oral secretions. Unable to hold up secretions and very high risk for aspiration. Discussed with RN will keep patient NPO. Overall declining. Sats dropping down in the 60s. Patient unable to hold meaningful conversation.  VITALS:  Blood pressure (!) 129/36, pulse 72, temperature 98.2 F (36.8 C), temperature source Oral, resp. rate 20, height 5\' 5"  (1.651 m), weight 99.3 kg, SpO2 (!) 65%.  PHYSICAL EXAMINATION:   GENERAL:  87 y.o.-year-old patient with acute distress. Morbidly obese, increase oral secretions appears with air hunger LUNGS: decreased breath sounds bilaterally CARDIOVASCULAR: S1, S2 normal.  ABDOMEN: Soft, diffuse tender, nondistended.  EXTREMITIES: +  edema b/l.   Left thumb skin tear + NEUROLOGIC confusion with some hallucinations LABORATORY PANEL:  CBC Recent Labs  Lab 01/03/23 0429  WBC 4.9  HGB 8.2*  HCT 26.9*  PLT 188    Chemistries  Recent Labs  Lab 01/03/23 0429  NA 141  K 4.1  CL 105  CO2 32  GLUCOSE 125*  BUN 12  CREATININE 0.84  CALCIUM 8.3*  MG 2.3   Assessment and Plan  CYANNE DELMAR is a 87 y.o. female with medical history significant for anxiety, aortic stenosis, COPD, coronary artery disease, type 2 diabetes mellitus, atrial fibrillation, GERD, hypertension, dyslipidemia, hypothyroidism, OSA, CKD stage IIIb, peripheral neuropathy, obesity, who presented to the hospital with abdominal pain.  She said abdominal pain has been going on intermittently for about 3 to 4 weeks.  She has also been having low back pain for about a couple of weeks that has slowly worsened.  There was also report of diarrhea.  Patient however said stools were not watery but reportedly she had bloodstained stool about  3 days prior to admission.  Stools were mostly greenish in color.  There was concern of C. difficile infection and apparently tested positive so she was started on oral vancomycin.   Chronic hypoxic hypercarbic respiratory failure /chronic COPD/chornic oxygen Chronic diastolic CHF:  -pt to use home CPAP--pt does not like to use much --seen by Dr Aleskerov--recommends consideration of hospice since overall carries a very poor prognosis. Pt is refusing PT/OT. Very fatigued -- patient overall has a lot of oral secretion. She is lethargic and not able to protect her airway. RN has been suctioning. Will keep her NPO for now. Patient is at high risk for aspiration.- Overall she is going downhill and discussed with daughter yesterday at bedside to consider comfort care and hospice given her multiple comorbidities and no improvement. Daughter wants to hold off on it for now.  Acute diverticulitis:Was  --completed  Augmentin   --Analgesics as needed for pain.   C. difficile infection: No diarrhea so stool for C. difficile testing could not be done in the hospital.  -- Patient had a positive C. difficile toxin test on 12/22/2022.   -- Completed treatment with oral vancomycin   L1 compression fracture: Patient declined L TSO brace.  She has been evaluated by neurosurgeon Dr Katrinka Blazing and no surgical intervention required.   --PT and OT evaluation--recs rehab    Chronic Anemia/IDA --hgb 8.3 --follows with hematology  --s/p  Venofer 200 mg x1   Chronic debility/morbid obesity --poor prognosis  Seen by Palliative care. Poor  prognosis  Family communication : sister at bedside  consults :Neurosurgery, pulmonary CODE STATUS: DNR DVT Prophylaxis :lovenox  declined by pt and dter--order discontinued Level of care: Med-Surg Status is: Inpatient TOC for d/c planning.    TOTAL TIME TAKING CARE OF THIS PATIENT: 35 minutes.  >50% time spent on counselling and coordination of care  Note: This dictation  was prepared with Dragon dictation along with smaller phrase technology. Any transcriptional errors that result from this process are unintentional.  Enedina Finner M.D    [facility]   CC: Primary care physician; Joaquim Nam, MD

## 2023-02-06 NOTE — Progress Notes (Signed)
   01/28/2023 1700  Spiritual Encounters  Type of Visit Initial  Care provided to: Family  Referral source Nurse (RN/NT/LPN)  Reason for visit End-of-life  OnCall Visit Yes  Spiritual Framework  Family Stress Factors Loss  Interventions  Spiritual Care Interventions Made Established relationship of care and support;Compassionate presence  Spiritual Care Plan  Spiritual Care Issues Still Outstanding No further spiritual care needs at this time (see row info)   Spoke with family patient had pass away. Daughter of patient seem to be doing well with patient death. She said her mother had a great life and know she is know with God. Patient had her last right with local [facility] out of [place]. Family said they were okay and is now trying to get things in order let family members know. Spoke with the nursing staff after to let them know how the family was doing.

## 2023-02-06 NOTE — Progress Notes (Signed)
PULMONOLOGY         Date: 02/03/2023,   MRN# [id number] Alexandria [name] [birth year]     AdmissionWeight: 99.3 kg                 CurrentWeight: 99.3 kg  Referring provider: Dr Denton Lank   CHIEF COMPLAINT:   Acute on chronic hypoxemic respiratory failure   HISTORY OF PRESENT ILLNESS    Patient went to pulmonologist and went to have APAP mask changed but was unable to get correct interface.  She is not able to tolerate current mask interface.  [name] her daughter shares that CO2 levels have been increased and causes her to have encephalopathy.  She also notes claustrophobia with full face mask.   01/01/23- Patient examined at baseline.  She was sitting up today but still with confusion and episodes of encephalopathy.  Patient does not wear cpap mask as instructed and this is partially due to encephalopathy but also due to comorbid advanced age status. We had a conference together with her POA daugther [name] and in presence of attending physician Dr Myriam Forehand.  We agreed that we do not wish to force patient to do things and do not recommend aggressive therapy due to advanced age comorbid status. Patient and family agree to goals of care discussion with Palliative care consultant.  We will not escalate therapy at this time.   01/06/23- patient remains with poor long term prognosis, she is not able to tolerate CPAP/BIPAP interface. I met with [name] today and have explained that her mother is at end of life and she may pass away.   Patient and family are working on goals of care with palliative care service.  I recommend DNR/DNI and consideration for hospice due to recurrent hospitalizations with advanced Emphysema/COPD and chronic hypercapnic hypoxemic respiratory failure.   01/08/2023- Patient seen at bedside.  Huel Coventry (sister of patient is at bedside).  Patient appears to be choking with high volume secretions.  She may aspirate and have respiratory arrest. She is at high risk of death.  Patient was noted to have auditoday and visual hallucinations which is likely to be due to hypoxemic/hypercapnic respiratory failure.  I recommend comfort measures for patient as she appears to be suffering.   PAST MEDICAL HISTORY   Past Medical History:  Diagnosis Date   Allergy, unspecified not elsewhere classified    Anxiety    Aortic stenosis, mild 03/30/2022   a. TTE 03/29/2022: EF 55-60, no RWMA, GR 1 DD, normal RVSF, mild aortic stenosis (mean 10, V-max 212 cm/s, DI 0.70)   Cataract    Chronic diastolic congestive heart failure (HCC)    a. 06/2022 Echo: EF 60-65%, no rwma, nl RV fxn, mild MR, mild AS.   COPD (chronic obstructive pulmonary disease) (HCC) 03/08/1998   PFTs 12/12/2002 FEV 1 1.42 (64%) ratio 58 with no better after B2 and DLCO75%; PFTs 11/20/09 FEV1 1.50 (73%) ratio 50 no better after B2 with DLCO 62%; Hfa 50% 11/20/2009 >75%, 01/13/10 p coaching   Coronary artery calcification seen on CT scan    a. 2012 Neg stress test.   Depression 12/07/1998   Diabetes mellitus type II 02/05/2006   Dr. Elvera Lennox with endo   Disorders of bursae and tendons in shoulder region, unspecified    Rotator cuff syndrome, right   E. coli bacteremia    Esophagitis    GERD (gastroesophageal reflux disease)    History of UTI    Hyperlipidemia 10/04/2000  Hypertension 03/08/1992   Hypothyroidism 03/08/1968   Iron deficiency anemia    Lung nodule    radiation starts 10-08-2019   Malignant neoplasm of right upper lobe of lung (HCC) 07/24/2019   Obesity    NOS   OSA (obstructive sleep apnea)    PSG 01/27/10 AHI 13, pt does not know CPAP settings   PAF (paroxysmal atrial fibrillation) (HCC)    a. 06/2022 in setting of UTI/encephalopathy-->converted on amio; b. CHA2DS2VASc = 6-->No OAC 2/2 chronic anemia/fall risk; c. 06/2022 Zio: Predominantly sinus bradycardia at 54 (3-158)'s.  1 brief run of SVT.  2.2% PVC burden.   Peripheral neuropathy    Likely due to DM per Dr. Sandria Manly   Renal lesion 12/22/2022    Ventral hernia      SURGICAL HISTORY   Past Surgical History:  Procedure Laterality Date   ABD U/S  03/19/1999   Nml x2 foci in liver   ADENOSINE MYOVIEW  06/02/2007   Nml   CARDIOLITE PERSANTINE  08/24/2000   Nml   CAROTID U/S  08/24/2000   1-39% ICA stenosis   CAROTID U/S  06/02/2007   No apprec change    CARPAL TUNNEL RELEASE  12/1997   Right   CESAREAN SECTION     x2 Breech/ repeat   CHOLECYSTECTOMY  1997   COLONOSCOPY WITH PROPOFOL N/A 02/12/2016   Procedure: COLONOSCOPY WITH PROPOFOL;  Surgeon: Rachael Fee, MD;  Location: [facility];  Service: Endoscopy;  Laterality: N/A;   CT ABD W & PELVIS WO/W CM     Abd hemangiomas of liver, 1 cm R renal cyst   DENTAL SURGERY  2016   Implants   DEXA  07/03/2003   Nml   ESOPHAGOGASTRODUODENOSCOPY  12/05/1997   Nml (due to hoarseness)   ESOPHAGOGASTRODUODENOSCOPY (EGD) WITH PROPOFOL N/A 02/12/2016   Procedure: ESOPHAGOGASTRODUODENOSCOPY (EGD) WITH PROPOFOL;  Surgeon: Rachael Fee, MD;  Location: [facility];  Service: Endoscopy;  Laterality: N/A;   GALLBLADDER SURGERY     HERNIA REPAIR  01/24/2009   Lap Ventr w/ Lysis of adhesions (Dr. Dwain Sarna)   knee arthroscopic surgery  years ago   right   ROTATOR CUFF REPAIR  1984   Right, [place]   SHOULDER OPEN ROTATOR CUFF REPAIR  02/08/2012   Procedure: ROTATOR CUFF REPAIR SHOULDER OPEN;  Surgeon: Drucilla Schmidt, MD;  Location: [facility];  Service: Orthopedics;  Laterality: Left;  Left Shoulder Open Anterior Acrominectomy Rotator Cuff Repair Open Distal Clavicle Resection ,tissue mend graft, and repair of biceps tendon   THUMB RELEASE  12/1997   Right   TONSILLECTOMY     TOTAL ABDOMINAL HYSTERECTOMY  1985   Due to dysmennorhea   US ECHOCARDIOGRAPHY  06/02/2007     FAMILY HISTORY   Family History  Problem Relation Age of Onset   Heart disease Mother    Thyroid disease Mother    Emphysema Father        One lung   Cystic fibrosis Sister    Hyperthyroidism  Sister    Osteoarthritis Brother    Hyperthyroidism Brother    Esophageal cancer Neg Hx    Cancer Neg Hx        Head or neck   Colon cancer Neg Hx    Stomach cancer Neg Hx    Breast cancer Neg Hx      SOCIAL HISTORY   Social History   Tobacco Use   Smoking status: Former    Current packs/day: 0.00  Average packs/day: 2.0 packs/day for 50.0 years (100.0 ttl pk-yrs)    Types: Cigarettes    Start date: 02/06/1955    Quit date: 02/05/2005    Years since quitting: 17.9   Smokeless tobacco: Never  Vaping Use   Vaping status: Never Used  Substance Use Topics   Alcohol use: Yes    Comment: occasionally   Drug use: Never     MEDICATIONS    Home Medication:     Current Medication:  Current Facility-Administered Medications:    acetaminophen (TYLENOL) tablet 650 mg, 650 mg, Oral, TID, Enedina Finner, MD, 650 mg at 01/06/23 2106   albuterol (PROVENTIL) (2.5 MG/3ML) 0.083% nebulizer solution 3 mL, 3 mL, Inhalation, Q6H PRN, Mansy, Jan A, MD, 3 mL at 01/01/23 1814   alum & mag hydroxide-simeth (MAALOX/MYLANTA) 200-200-20 MG/5ML suspension 15 mL, 15 mL, Oral, Q6H PRN, Lurene Shadow, MD, 15 mL at 01/03/23 1301   amiodarone (PACERONE) tablet 200 mg, 200 mg, Oral, Daily, Mansy, Jan A, MD, 200 mg at 01/06/23 0942   arformoterol (BROVANA) nebulizer solution 15 mcg, 15 mcg, Nebulization, QHS, Mansy, Jan A, MD, 15 mcg at 01/06/23 2027   budesonide (PULMICORT) nebulizer solution 0.5 mg, 0.5 mg, Nebulization, BID, Mansy, Jan A, MD, 0.5 mg at 01/14/2023 0746   busPIRone (BUSPAR) tablet 15 mg, 15 mg, Oral, TID, Mansy, Jan A, MD, 15 mg at 01/06/23 2106   cholecalciferol (VITAMIN D3) 25 MCG (1000 UNIT) tablet 2,000 Units, 2,000 Units, Oral, Daily, Mansy, Jan A, MD, 2,000 Units at 01/06/23 1610   ezetimibe (ZETIA) tablet 10 mg, 10 mg, Oral, QHS, Mansy, Jan A, MD, 10 mg at 01/06/23 2109   ferrous sulfate tablet 325 mg, 325 mg, Oral, Daily, Mansy, Jan A, MD, 325 mg at 01/06/23 9604   FLUoxetine  (PROZAC) capsule 20 mg, 20 mg, Oral, BID, Mansy, Jan A, MD, 20 mg at 01/06/23 2106   fluticasone (FLONASE) 50 MCG/ACT nasal spray 2 spray, 2 spray, Each Nare, Daily, Mansy, Jan A, MD, 2 spray at 01/06/23 0945   guaiFENesin (MUCINEX) 12 hr tablet 1,200 mg, 1,200 mg, Oral, BID PRN, Mansy, Jan A, MD, 1,200 mg at 01/03/23 5409   levothyroxine (SYNTHROID) tablet 125 mcg, 125 mcg, Oral, Q0600, Mansy, Jan A, MD, 125 mcg at 01/17/2023 0509   lidocaine (LIDODERM) 5 % 1 patch, 1 patch, Transdermal, Q24H, Mansy, Jan A, MD, 1 patch at 01/06/23 0936   loratadine (CLARITIN) tablet 10 mg, 10 mg, Oral, Daily PRN, Mansy, Jan A, MD   LORazepam (ATIVAN) tablet 0.5 mg, 0.5 mg, Oral, Q4H PRN, Mansy, Jan A, MD, 0.5 mg at 01/30/2023 0301   magnesium hydroxide (MILK OF MAGNESIA) suspension 30 mL, 30 mL, Oral, Daily PRN, Mansy, Jan A, MD   nitroGLYCERIN (NITROSTAT) SL tablet 0.4 mg, 0.4 mg, Sublingual, Q5 min PRN, Mansy, Jan A, MD   ondansetron (ZOFRAN) tablet 4 mg, 4 mg, Oral, Q6H PRN **OR** ondansetron (ZOFRAN) injection 4 mg, 4 mg, Intravenous, Q6H PRN, Mansy, Jan A, MD   pantoprazole (PROTONIX) EC tablet 40 mg, 40 mg, Oral, Daily, Mansy, Jan A, MD, 40 mg at 01/06/23 0939   potassium chloride (KLOR-CON M) CR tablet 10 mEq, 10 mEq, Oral, Daily, Mansy, Jan A, MD, 10 mEq at 01/06/23 8119   revefenacin (YUPELRI) nebulizer solution 175 mcg, 175 mcg, Nebulization, Daily, Mansy, Jan A, MD, 175 mcg at 01/15/2023 0746   rosuvastatin (CRESTOR) tablet 10 mg, 10 mg, Oral, QODAY, Mansy, Jan A, MD, 10 mg at 01/06/23 [phone]  sodium chloride flush (NS) 0.9 % injection 10 mL, 10 mL, Intravenous, Q12H, Mumma, Shannon, MD, 10 mL at 01/05/23 2151   torsemide (DEMADEX) tablet 20 mg, 20 mg, Oral, Daily PRN, Otelia Sergeant, RPH   traZODone (DESYREL) tablet 25 mg, 25 mg, Oral, QHS PRN, Mansy, Jan A, MD, 25 mg at 01/06/23 2108    ALLERGIES   Antihistamines, diphenhydramine-type; Incruse ellipta [umeclidinium bromide]; Clarithromycin; Codeine;  Diphenhydramine; Pregabalin; Sulfonamide derivatives; Tiotropium bromide monohydrate; Umeclidinium; and Vraylar [cariprazine]     REVIEW OF SYSTEMS    Review of Systems:  Gen:  Denies  fever, sweats, chills weigh loss  HEENT: Denies blurred vision, double vision, ear pain, eye pain, hearing loss, nose bleeds, sore throat Cardiac:  No dizziness, chest pain or heaviness, chest tightness,edema Resp:   reports dyspnea chronically  Gi: Denies swallowing difficulty, stomach pain, nausea or vomiting, diarrhea, constipation, bowel incontinence Gu:  Denies bladder incontinence, burning urine Ext:   Denies Joint pain, stiffness or swelling Skin: Denies  skin rash, easy bruising or bleeding or hives Endoc:  Denies polyuria, polydipsia , polyphagia or weight change Psych:   Denies depression, insomnia or hallucinations   Other:  All other systems negative   VS: BP (!) 129/36   Pulse 72   Temp 98.2 F (36.8 C) (Oral)   Resp 20   Ht 5\' 5"  (1.651 m)   Wt 99.3 kg   LMP  (LMP Unknown)   SpO2 (!) 65%   BMI 36.44 kg/m      PHYSICAL EXAM    GENERAL:NAD, no fevers, chills, no weakness no fatigue HEAD: Normocephalic, atraumatic.  EYES: Pupils equal, round, reactive to light. Extraocular muscles intact. No scleral icterus.  MOUTH: Moist mucosal membrane. Dentition intact. No abscess noted.  EAR, NOSE, THROAT: Clear without exudates. No external lesions.  NECK: Supple. No thyromegaly. No nodules. No JVD.  PULMONARY: decreased breath sounds with mild rhonchi worse at bases bilaterally.  CARDIOVASCULAR: S1 and S2. Regular rate and rhythm. No murmurs, rubs, or gallops. No edema. Pedal pulses 2+ bilaterally.  GASTROINTESTINAL: Soft, nontender, nondistended. No masses. Positive bowel sounds. No hepatosplenomegaly.  MUSCULOSKELETAL: No swelling, clubbing, or edema. Range of motion full in all extremities.  NEUROLOGIC: Cranial nerves II through XII are intact. No gross focal neurological  deficits. Sensation intact. Reflexes intact.  SKIN: No ulceration, lesions, rashes, or cyanosis. Skin warm and dry. Turgor intact.  PSYCHIATRIC: Mood, affect within normal limits. The patient is awake, alert and oriented x 3. Insight, judgment intact.       IMAGING   @IMAGES @   ASSESSMENT/PLAN     Advanced copd with chronic hypoxemia and hypercarbia  -Agree with APAP and will obtain appropriate mask  -patient struggles to use this device , we agreed on palliative eval -recommendation for hospice  Acute UTI   As per [facility] , currently on abx      Thank you for allowing me to participate in the care of this patient.   Patient/Family are satisfied with care plan and all questions have been answered.    Provider disclosure: Patient with at least one acute or chronic illness or injury that poses a threat to life or bodily function and is being managed actively during this encounter.  All of the below services have been performed independently by signing provider:  review of prior documentation from internal and or external health records.  Review of previous and current lab results.  Interview and comprehensive assessment during patient visit today. Review  of current and previous chest radiographs/CT scans. Discussion of management and test interpretation with health care team and patient/family.   This document was prepared using Dragon voice recognition software and may include unintentional dictation errors.     Vida Rigger, M.D.  Division of Pulmonary & Critical Care Medicine

## 2023-02-06 NOTE — Death Summary Note (Signed)
DEATH SUMMARY   Patient Details  Name: Alexandria [name] MRN: [id number] DOB: June 09, 1935 [id number], Dwana Curd, MD Admission/Discharge Information   Admit Date:  2023/01/05  Date of Death: Date of Death: 01-20-2023  Time of Death: Time of Death: 1724  Length of Stay: 02-Feb-2023   Principle Cause of death:   Chronic hypoxic hypercarbic respiratory failure /chronic COPD/chornic oxygen Chronic diastolic CHF:   SEELEY KARMANN is a 87 y.o. female with medical history significant for anxiety, aortic stenosis, COPD, coronary artery disease, type 2 diabetes mellitus, atrial fibrillation, GERD, hypertension, dyslipidemia, hypothyroidism, OSA, CKD stage IIIb, peripheral neuropathy, obesity, who presented to the hospital with abdominal pain.   Patient was treated for acute diverticulitis and C. difficile infection. He also had L1 compression fracture. Patient initially improved however later part of the hospitalizations and continues slow decline in her overall health condition. Pulmonary consultation was obtained. Patient continued to do poorly from respiratory standpoint. Palliative care was consulted. Patient was made DNR DNI after discussion by daughter and family. She died on 2024-11-14at 1724 hrs.    The results of significant diagnostics from this hospitalization (including imaging, microbiology, ancillary and laboratory) are listed below for reference.   Significant Diagnostic Studies: DG Chest Port 1 View  Result Date: 12/31/2022 CLINICAL DATA:  Atelectasis in shortness of breath EXAM: PORTABLE CHEST 1 VIEW COMPARISON:  11/30/2022 FINDINGS: Atherosclerotic calcification of the aortic arch. Mitral valve calcifications. Body habitus reduces diagnostic sensitivity and specificity. Ill definition of both hemidiaphragms making it difficult to exclude the possibility of atelectasis or layering pleural effusions although some of this appearance may be due to the reverse lordotic projection. IMPRESSION:  1. Ill definition of both hemidiaphragms making it difficult to exclude the possibility of atelectasis or layering pleural effusions although some of this appearance may be due to the reverse lordotic projection. Consider lateral view if feasible. 2. Aortic Atherosclerosis (ICD10-I70.0). Electronically Signed   By: Gaylyn Rong M.D.   On: 12/31/2022 17:42   CT ABDOMEN PELVIS W CONTRAST  Result Date: 01/05/2023 CLINICAL DATA:  Left lower quadrant abdominal pain EXAM: CT ABDOMEN AND PELVIS WITH CONTRAST TECHNIQUE: Multidetector CT imaging of the abdomen and pelvis was performed using the standard protocol following bolus administration of intravenous contrast. RADIATION DOSE REDUCTION: This exam was performed according to the departmental dose-optimization program which includes automated exposure control, adjustment of the mA and/or kV according to patient size and/or use of iterative reconstruction technique. CONTRAST:  OMNIPAQUE IOHEXOL 300 MG/ML  SOLN COMPARISON:  11/16/2022 FINDINGS: Lower chest: No acute abnormality. Aortic and coronary artery atherosclerosis. Calcification of the mitral annulus. Hepatobiliary: Unchanged hepatic hemangioma. No new focal liver abnormality. Prior cholecystectomy. No biliary dilatation. Pancreas: Unremarkable. No pancreatic ductal dilatation or surrounding inflammatory changes. Spleen: Normal in size without focal abnormality. Adrenals/Urinary Tract: Unremarkable adrenal glands. Bilateral renal cysts. Previously described complex lesion within the upper pole left kidney appears to represent 2 small adjacent cysts upon today's exam (series 5, image 85). No renal stone or hydronephrosis. Urinary bladder within normal limits. Stomach/Bowel: Gastric fold appear diffusely thickened, similar to prior. No dilated loops of bowel. Extensive colonic diverticulosis. Potentially mildly inflamed diverticulum at the junction of the descending and sigmoid colon with mild fat  stranding. No additional convincing sites of diverticulitis. Normal appendix in the right lower quadrant. Vascular/Lymphatic: Aortic atherosclerosis. No enlarged abdominal or pelvic lymph nodes. Reproductive: Status post hysterectomy. No adnexal masses. Other: No free fluid. No abdominopelvic fluid collection. No pneumoperitoneum.  Prior ventral abdominal wall hernia repair with mesh. Musculoskeletal: Acute compression fracture of the L1 vertebral body involving both the superior and inferior endplates with approximately 40% vertebral body height loss, new from prior. Facet predominant lower lumbar spondylosis. IMPRESSION: 1. Acute compression fracture of the L1 vertebral body with approximately 40% vertebral body height loss, new from prior. 2. Extensive colonic diverticulosis. Potentially mildly inflamed diverticulum at the junction of the descending and sigmoid colon with mild fat stranding. Findings may represent mild acute diverticulitis. No additional convincing sites of diverticulitis. 3. Gastric folds appear diffusely thickened, similar to prior. Correlate for gastritis. 4. Previously described complex lesion within the upper pole left kidney appears to represent two small adjacent cysts upon today's exam. 5. Aortic atherosclerosis (ICD10-I70.0). Electronically Signed   By: Duanne Guess D.O.   On: 12/29/2022 19:24     Time spent: 25 minutes  Signed: Enedina Finner, MD 01/15/23

## 2023-02-06 NOTE — Progress Notes (Signed)
PT Cancellation Note  Patient Details Name: Alexandria [name] MRN: [id number] DOB: 05-Sep-1935   Cancelled Treatment:    Reason Eval/Treat Not Completed: Medical issues which prohibited therapy (Per discussion with attending, patient with change in medical status; patient/family considering comfort care. Unable to tolerate therapy at this time.  Will complete order; please reconsult should goals of care and medical status change.)   Abdalla Naramore H. Manson Passey, PT, DPT, NCS 01/12/2023, 12:25 PM [phone]

## 2023-02-06 NOTE — Progress Notes (Signed)
[facility] Liaison team will follow peripherally while awaiting a discharge disposition.       Please don't hesitate to call with any Hospice related questions or concerns.    Thank you for the opportunity to participate in this patient's care. [facility] Liaison [phone]

## 2023-02-06 NOTE — Progress Notes (Addendum)
Went to meet pt's Dter int her room with her sister and family at bedside Dter would like to start hydration and allow po diet per her [employer] religion. Family understands poor prognosis but she is insisting I give IVF. Discussed fluids will be at 40 cc/hr. D/w dter that family can feed her food for pleasure when she is awake knowing she is at high risk for aspiration. Staff will give meds only if she is awake and able to take them safely. She is going to look into to see if she can get other form of Nourishment. Pt will need new IV started. RN informed

## 2023-02-06 DEATH — deceased

## 2023-04-04 ENCOUNTER — Ambulatory Visit: Payer: Medicare Other | Admitting: Radiation Oncology

## 2023-09-28 IMAGING — CR DG ELBOW COMPLETE 3+V*R*
4 series · 4 of 4 positions shown · non-contrast
Comparison: None

CLINICAL DATA: An 85-year-old female presents for evaluation of
elbow pain following fall.

EXAM:
RIGHT ELBOW - COMPLETE 3+ VIEW

[x elbow lat right]
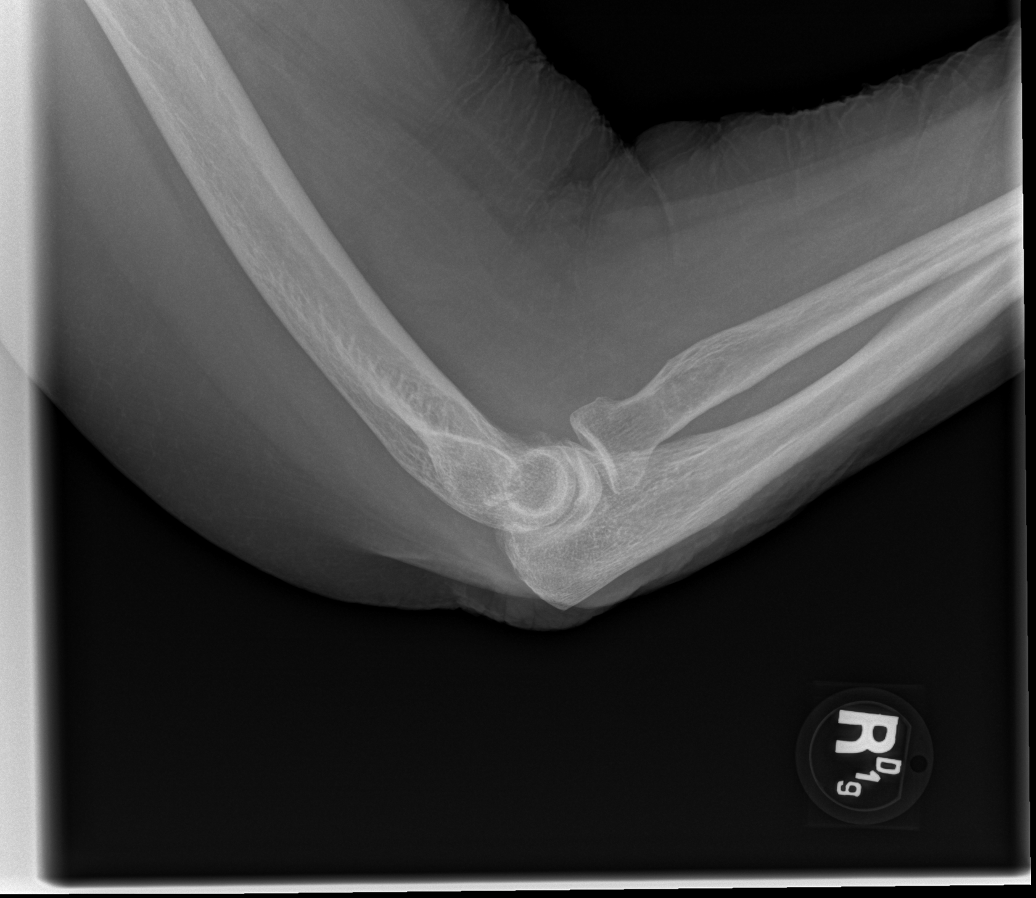

[x elbow ap right]
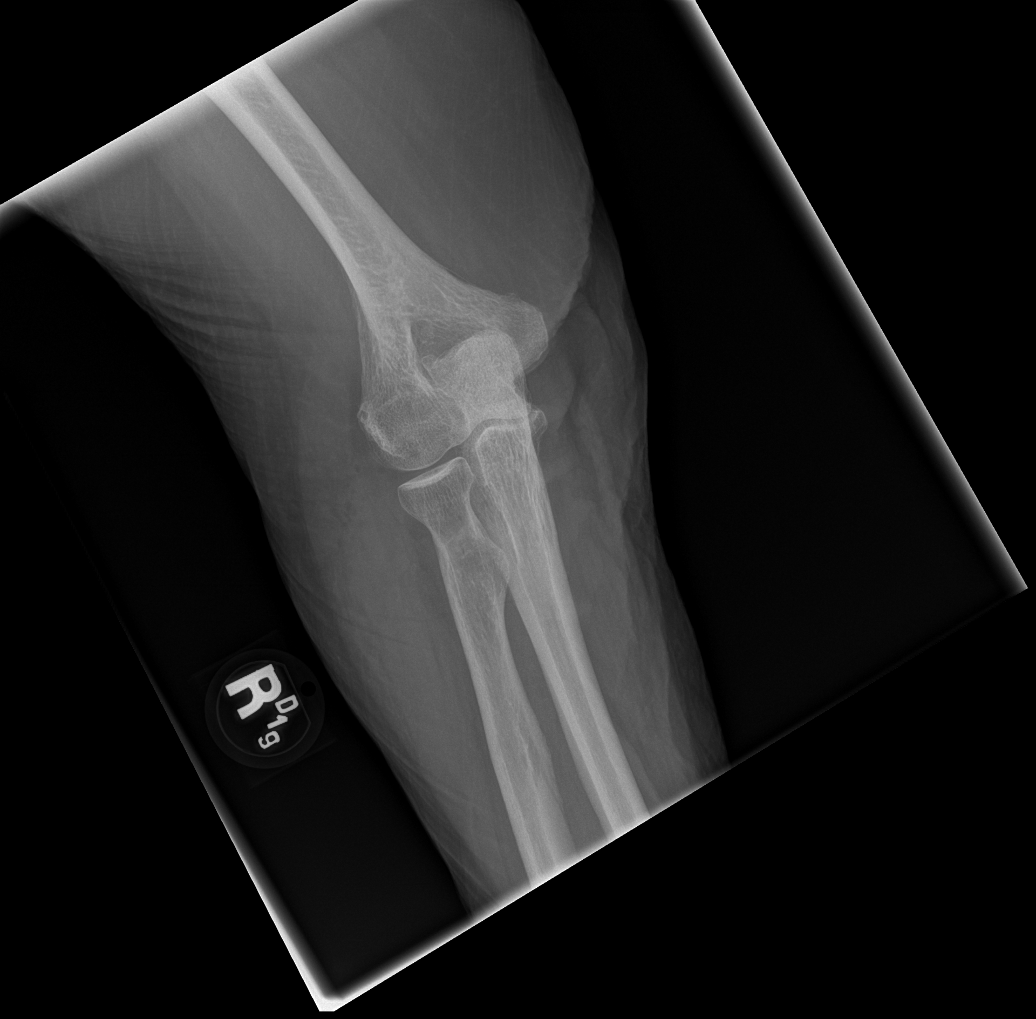

[x elbow obl right (1 of 2)]
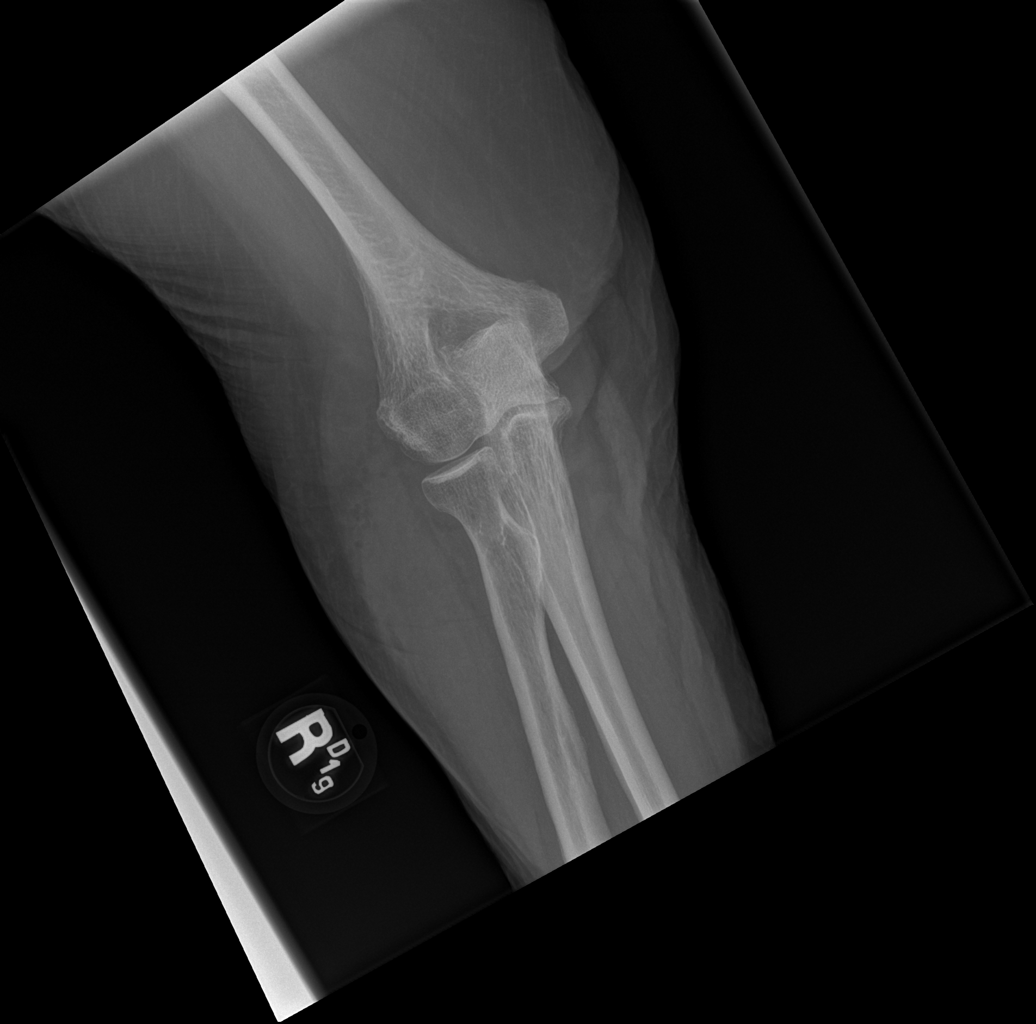

[x elbow obl right (2 of 2)]
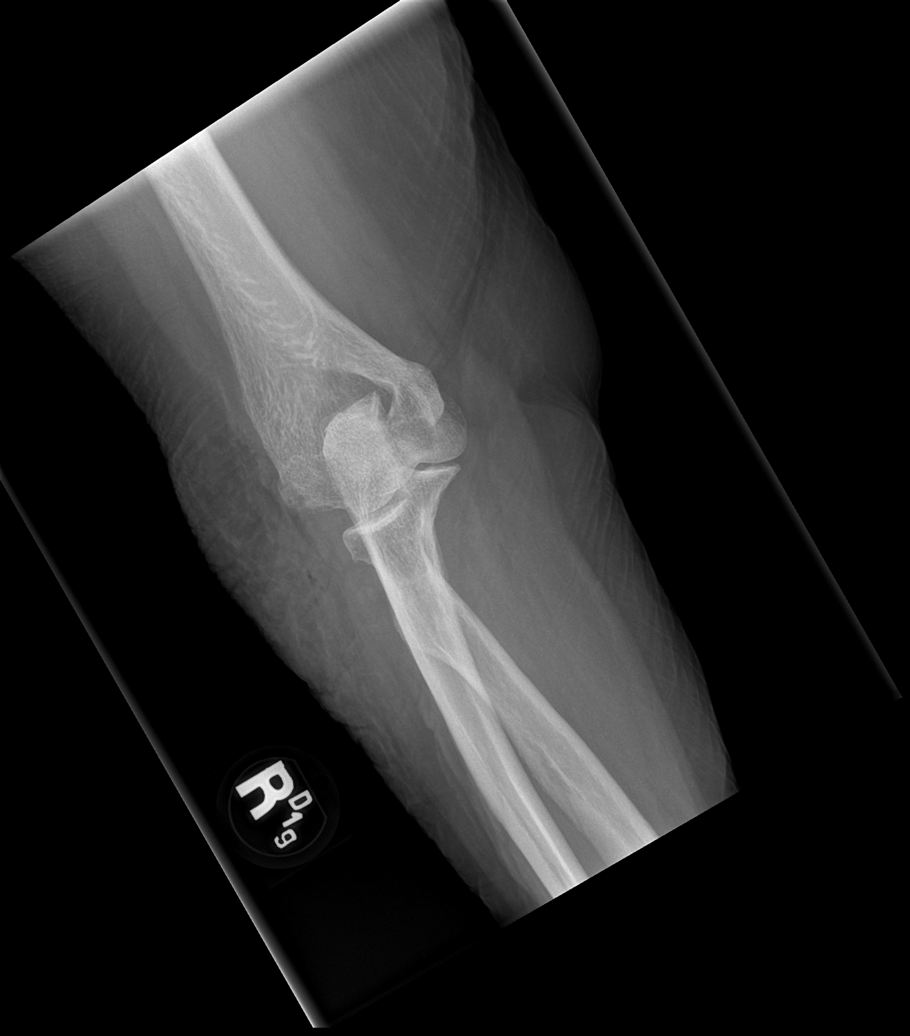

[4 of 4 positions shown; findings below may reference images not displayed]

FINDINGS: Soft tissue swelling about the lateral elbow. No joint effusion. No
visible fracture. Degenerative changes about the elbow.
IMPRESSION: No visible fracture or dislocation. Soft tissue swelling about the
lateral elbow.

## 2023-09-28 IMAGING — CR DG KNEE COMPLETE 4+V*R*
4 series · 4 of 4 positions shown · non-contrast
Comparison: Knee radiograph 10/01/2016

CLINICAL DATA: Fall from walker.  Laceration on right knee.

EXAM:
RIGHT KNEE - COMPLETE 4+ VIEW

[x knee ap right (1 of 3)]
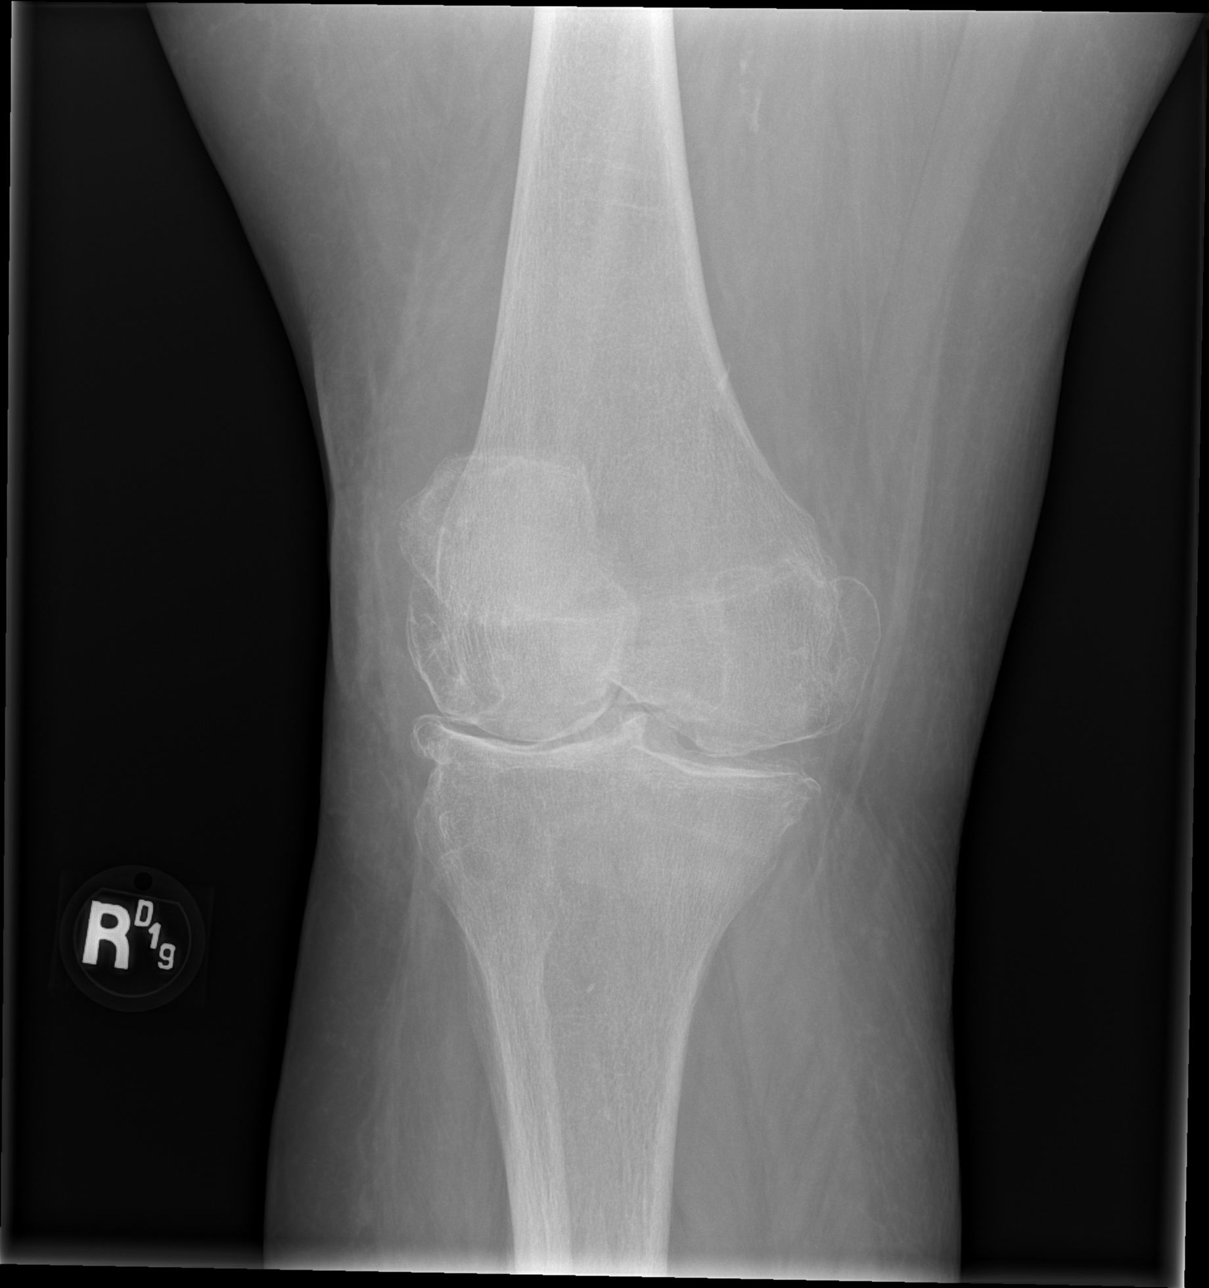

[x knee ap right (2 of 3)]
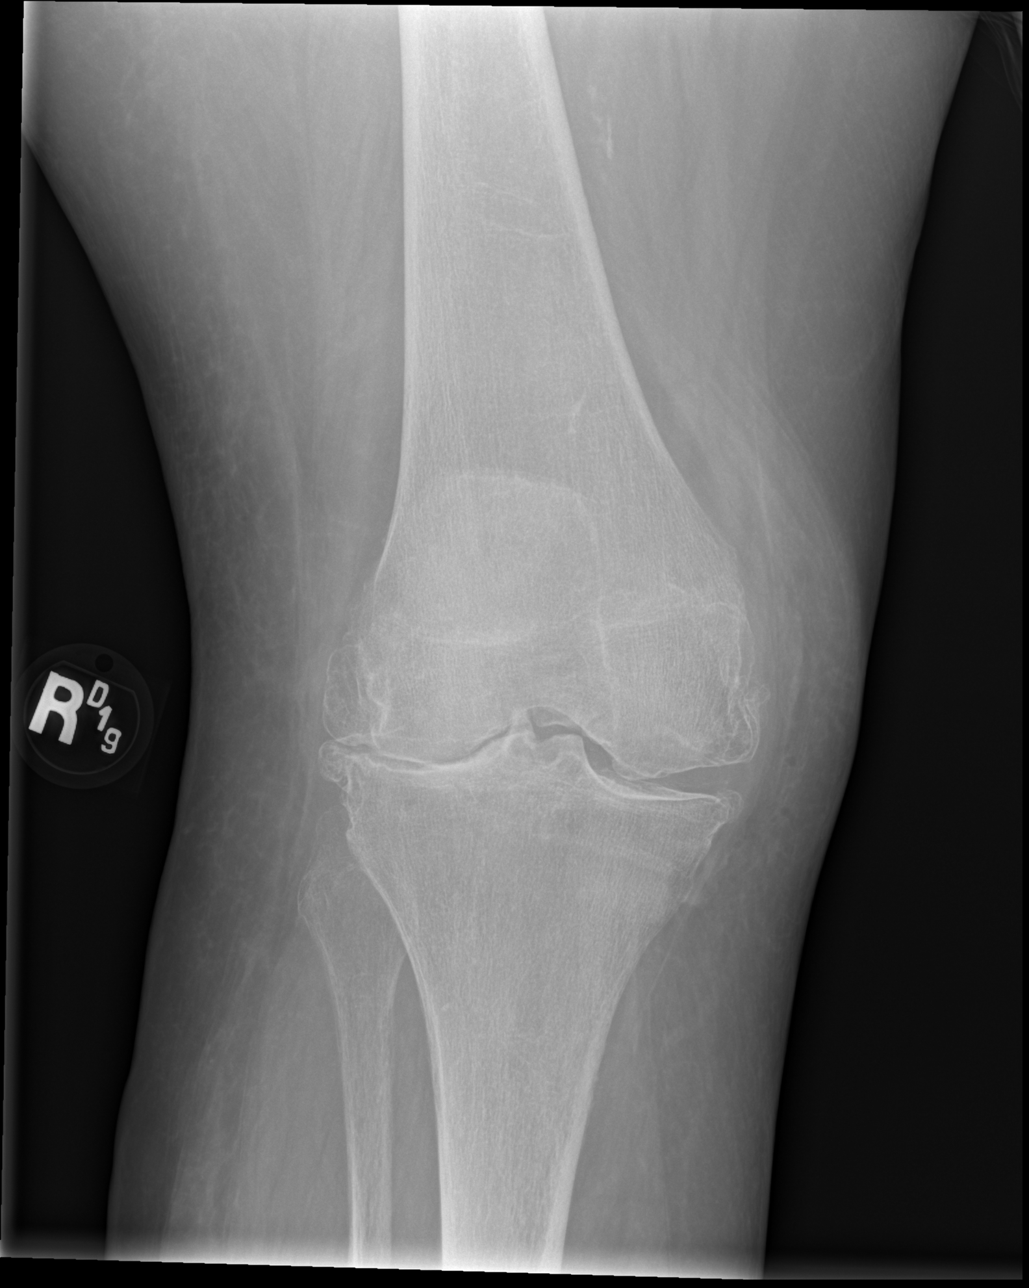

[x knee ap right (3 of 3)]
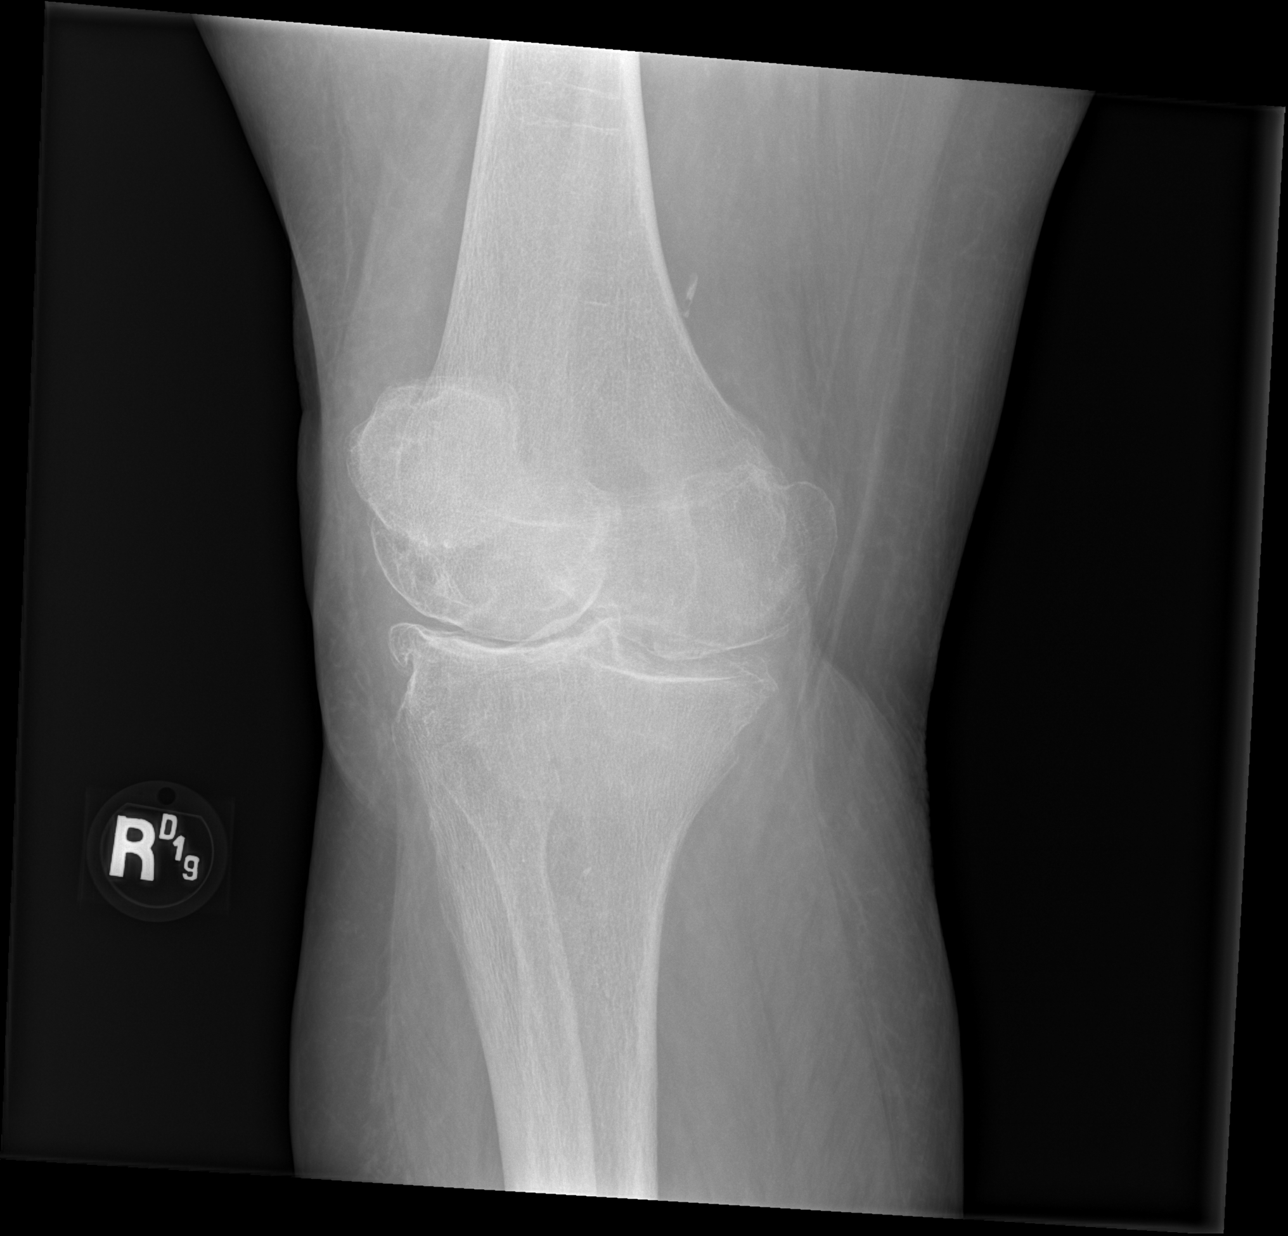

[x knee lat right]
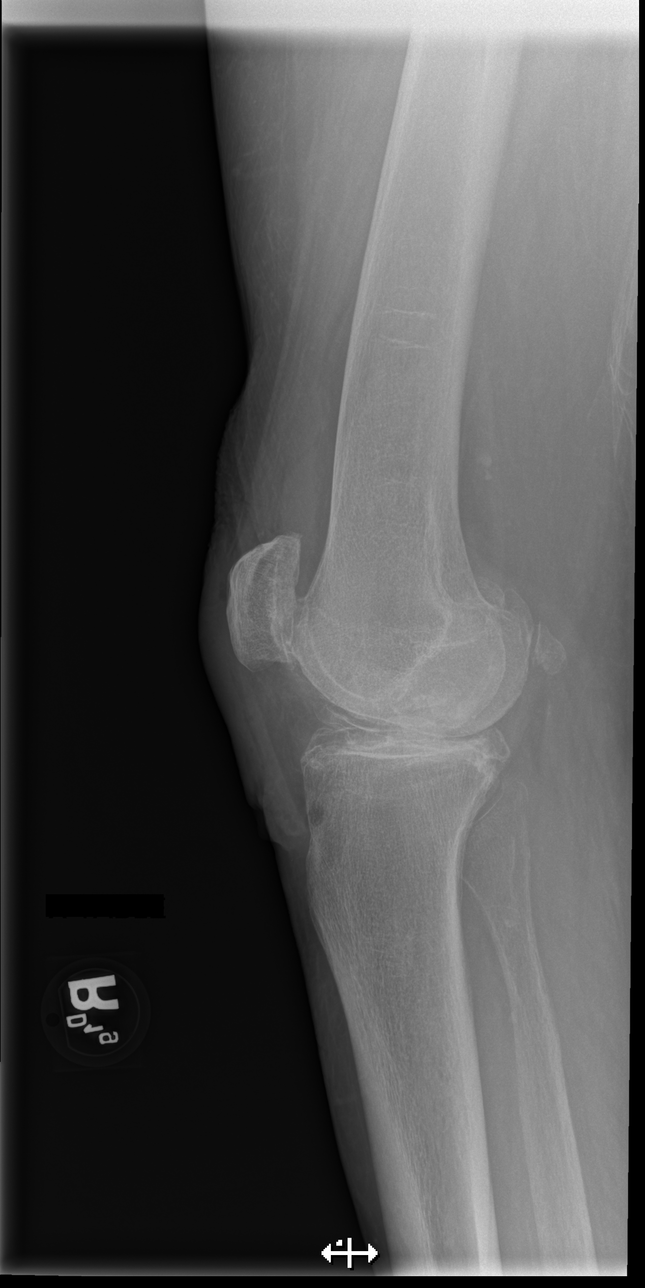

[4 of 4 positions shown; findings below may reference images not displayed]

FINDINGS: No evidence of fracture or dislocation. Small suprapatellar joint
effusion. Severe osteoarthritis in the medial compartment and
moderate osteoarthritis in the lateral compartment. No focal osseous
lesion. Mild soft tissue swelling and small focus of subcutaneous
air overlying the patella, consistent with history of laceration to
the right knee. No radiopaque foreign body.
IMPRESSION: No acute osseous abnormality.  Small suprapatellar joint effusion.

Mild soft tissue swelling and small focus of subcutaneous air
overlying the patella, consistent with history of laceration to the
right knee.

Advanced osteoarthritis.
